# Patient Record
Sex: Female | Born: 1985 | Race: White | Hispanic: No | Marital: Married | Smoking: Former smoker
Health system: Southern US, Community
[De-identification: ages and names within clinical notes are randomized; demographics above are authoritative.]

## PROBLEM LIST (undated history)

## (undated) ENCOUNTER — Ambulatory Visit: Payer: MEDICAID | Source: Home / Self Care

## (undated) DIAGNOSIS — G8929 Other chronic pain: Secondary | ICD-10-CM

## (undated) DIAGNOSIS — F1120 Opioid dependence, uncomplicated: Secondary | ICD-10-CM

## (undated) DIAGNOSIS — K279 Peptic ulcer, site unspecified, unspecified as acute or chronic, without hemorrhage or perforation: Secondary | ICD-10-CM

## (undated) DIAGNOSIS — K219 Gastro-esophageal reflux disease without esophagitis: Secondary | ICD-10-CM

## (undated) DIAGNOSIS — E119 Type 2 diabetes mellitus without complications: Secondary | ICD-10-CM

## (undated) DIAGNOSIS — G629 Polyneuropathy, unspecified: Secondary | ICD-10-CM

## (undated) DIAGNOSIS — J449 Chronic obstructive pulmonary disease, unspecified: Secondary | ICD-10-CM

## (undated) DIAGNOSIS — F419 Anxiety disorder, unspecified: Secondary | ICD-10-CM

## (undated) DIAGNOSIS — R161 Splenomegaly, not elsewhere classified: Secondary | ICD-10-CM

## (undated) DIAGNOSIS — R569 Unspecified convulsions: Secondary | ICD-10-CM

## (undated) DIAGNOSIS — G4700 Insomnia, unspecified: Secondary | ICD-10-CM

## (undated) DIAGNOSIS — R109 Unspecified abdominal pain: Secondary | ICD-10-CM

## (undated) DIAGNOSIS — F32A Depression, unspecified: Secondary | ICD-10-CM

## (undated) DIAGNOSIS — K5790 Diverticulosis of intestine, part unspecified, without perforation or abscess without bleeding: Secondary | ICD-10-CM

## (undated) DIAGNOSIS — M329 Systemic lupus erythematosus, unspecified: Secondary | ICD-10-CM

## (undated) DIAGNOSIS — G43909 Migraine, unspecified, not intractable, without status migrainosus: Secondary | ICD-10-CM

## (undated) DIAGNOSIS — B1920 Unspecified viral hepatitis C without hepatic coma: Secondary | ICD-10-CM

## (undated) DIAGNOSIS — E785 Hyperlipidemia, unspecified: Secondary | ICD-10-CM

## (undated) DIAGNOSIS — J45909 Unspecified asthma, uncomplicated: Secondary | ICD-10-CM

## (undated) DIAGNOSIS — IMO0002 Reserved for concepts with insufficient information to code with codable children: Secondary | ICD-10-CM

## (undated) DIAGNOSIS — M549 Dorsalgia, unspecified: Secondary | ICD-10-CM

## (undated) DIAGNOSIS — K7460 Unspecified cirrhosis of liver: Secondary | ICD-10-CM

## (undated) DIAGNOSIS — K469 Unspecified abdominal hernia without obstruction or gangrene: Secondary | ICD-10-CM

## (undated) DIAGNOSIS — F329 Major depressive disorder, single episode, unspecified: Secondary | ICD-10-CM

## (undated) HISTORY — DX: Hyperlipidemia, unspecified: E78.5

## (undated) HISTORY — PX: CHOLECYSTECTOMY: SHX55

## (undated) HISTORY — DX: Splenomegaly, not elsewhere classified: R16.1

## (undated) HISTORY — DX: Type 2 diabetes mellitus without complications: E11.9

## (undated) HISTORY — DX: Unspecified asthma, uncomplicated: J45.909

## (undated) NOTE — *Deleted (*Deleted)
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 She was also found to be hypoglycemic. She did not respond to Narcan or glucose. She was intubated for airway protection and was sent to [facility] from [place]. Currently her respiratory status is stable, status post extubation. Patient transferred to [facility] service on 09/05/20.  Hospital course remarkable for encephalopathy with slow improvement and aphagia.Neurology consulted    Assessment & Plan:   Principal Problem:   Adjustment disorder with depressed mood Active Problems:   Pressure injury of skin   Respiratory failure (HCC)   Acute encephalopathy   Acute metabolic encephalopathy: Thought to be secondary to hypoglycemia, polypharmacy. 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 MRI of the brain did not show any acute intracranial abnormalities. None also in CT.She was taking Klonopin, Flexeril, Lyrica which have are on hold. Resumed home Seroquel, Effexor and methadone.  Mildly elevated ammonia level. LTM EEG showed severe diffuse encephalopathy likely related to sedation, no epileptiform discharges. Her mental status has improved.  Follows commands but does not talk and she is alert and awake and follows commands. We requested psychiatry consultation.  Psychiatry recommended to continue current medication with outpatient follow-up. Neurology consulted today.  Acute hypoxic respiratory failure: She was intubated for airway protection. Hospital course remarkable for aspiration pneumonitis. Currently on room air.  Aspiration pneumonitis: Sputum culture positive for Serratia  marcescens.Treated with cefipime  UTI: Urine culture positive for Enterococcus faecalis. Treated with  ampicillin.  Acute on chronic diastolic congestive heart failure: Appeared volume overloaded on presentation. Takes Lasix at home. Continue current Lasix regimen. Also taking lisinopril, Crestor and Aldactone at home.Continue IV lasix for now  Cirrhosis/NASH/hepatitis C: She says she has been treated for hepatitis C. 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 She has chronic anasarca/ascites. Follow-up ammonia level. Continue lactulose. liver enzymes are almost normal.  Anasarca improving with IV Lasix  Diabetes type 2: On Victoza, Metformin,insulin at home. Continue sliding scale insulin here. Her recent hemoglobin A1c is 7.1. She is on extremely high-dose of insulin at home. May be she can be discharged only on oral medications. I have consulted diabetic coordinator consultation  Pancytopenia: Likely associated with chronic liver disease/chronic medical problems. Also has thrombocytopenia in the setting of cirrhosis. Continue to monitor CBC.  Hypokalemia: Being monitored and supplemented   Morbid obesity: BMI of 46.9  Debility/deconditioning:  PT/OT evaluation done and recommended HH      Nutrition Problem: Inadequate oral intake Etiology: acute illness      DVT prophylaxis:Lovenox Code Status: Full Family Communication: Grandfather  present at the bedside Status is: Inpatient  Remains inpatient appropriate because:Inpatient level of care appropriate due to severity of illness   Dispo: The patient is from: Home              Anticipated d/c is to: Home              Anticipated d/c date is: tomorrow              Patient currently is not medically stable to d/c. Needs further improvement in the mental status before discharge.    Consultants: PCCM  Procedures:Intubation  Antimicrobials:  Anti-infectives (From admission, onward)   Start     Dose/Rate Route  Frequency Ordered Stop   09/03/20 1400  ceFEPIme (MAXIPIME) 2 g in sodium chloride 0.9 % 100 mL IVPB        2 g 200 mL/hr over 30 Minutes Intravenous Every 8 hours 09/03/20 1233 09/08/20 1400   09/03/20 1330  ampicillin (OMNIPEN) 2 g in sodium chloride 0.9 % 100 mL IVPB        2 g 300 mL/hr over 20 Minutes Intravenous Every 4 hours 09/03/20 1233 09/06/20 1240   09/03/20 0930  piperacillin-tazobactam (ZOSYN) IVPB 3.375 g  Status:  Discontinued        3.375 g 12.5 mL/hr over 240 Minutes Intravenous Every 8 hours 09/03/20 0844 09/03/20 1233   08/31/20 2015  ceFEPIme (MAXIPIME) 2 g in sodium chloride 0.9 % 100 mL IVPB  Status:  Discontinued        2 g 200 mL/hr over 30 Minutes Intravenous Every 8 hours 08/31/20 1924 09/02/20 0813   08/31/20 1930  linezolid (ZYVOX) IVPB 600 mg  Status:  Discontinued        600 mg 300 mL/hr over 60 Minutes Intravenous Every 12 hours 08/31/20 1922 09/01/20 0810   08/31/20 1830  ceFEPIme (MAXIPIME) 2 g in sodium chloride 0.9 % 100 mL IVPB  Status:  Discontinued        2 g 200 mL/hr over 30 Minutes Intravenous  Once 08/31/20 1817 08/31/20 1924      Subjective:  Patient seen and examined at the bedside this morning.  Hemodynamically stable.  She looks better than yesterday.  Remains alert, awake and obeys commands.  She said a word "November "to me but the speech is not a spontaneous  Objective: Vitals:   09/08/20 1408 09/08/20 2045 09/09/20 0326 09/09/20 0524  BP: 138/79 132/81  129/87  Pulse: (!) 107 92  93  Resp: 18 18  20   Temp: 98.2 F (36.8 C) 98.9 F (37.2 C)  98.8 F (37.1 C)  TempSrc:  Oral  Oral  SpO2: 92% 99%  96%  Weight:   133.9 kg   Height:        Intake/Output Summary (Last 24 hours) at 09/09/2020 0747 Last data filed at 09/08/2020 2048 Gross per 24 hour  Intake -  Output 1600 ml  Net -1600 ml   Filed Weights   09/03/20 0500 09/04/20 0358 09/09/20 0326  Weight: (!) 146.5 kg (!) 144.2 kg 133.9 kg    Examination:  General  exam: Appears calm and comfortable ,Not in distress, morbidly obese HEENT:PERRL,Oral mucosa moist, Ear/Nose normal on gross exam Respiratory system: Bilateral equal air entry, normal vesicular breath sounds, no wheezes or crackles  Cardiovascular system: S1 & S2 heard, RRR. No JVD, murmurs, rubs, gallops or clicks. Gastrointestinal system: Abdomen is nondistended, soft and nontender. No organomegaly or masses felt. Normal bowel sounds heard. Central nervous system: Alert and awake. No focal neurological deficits. Extremities: Improving anasarca, no clubbing ,no cyanosis Skin: No rashes, lesions or ulcers,no icterus ,no pallor   Data Reviewed: I have personally reviewed following labs and imaging studies  CBC: Recent Labs  Lab 09/04/20 0110 09/05/20 0027 09/06/20 0337 09/07/20 0450 09/09/20 0226  WBC 3.4* 3.8* 2.9* 2.7* 3.0*  HGB 8.8* 9.1* 9.4* 9.2* 9.6*  HCT 27.1* 28.5* 29.4* 29.0* 30.8*  MCV 91.6 92.2 92.5 93.2 94.5  PLT 70* 71* 73* 74* 75*   Basic Metabolic Panel: Recent Labs  Lab 09/04/20 0110 09/04/20  0110 09/05/20 0027 09/06/20 0337 09/07/20 0450 09/08/20 0910 09/09/20 0226  NA 139   < > 141 142 142 143 142  K 3.7   < > 3.3* 3.3* 3.4* 3.5 4.3  CL 104   < > 105 107 106 108 106  CO2 26   < > 27 26 26 26 27   GLUCOSE 153*   < > 121* 124* 128* 124* 125*  BUN 12   < > 11 9 11 12 11   CREATININE 0.56   < > 0.57 0.56 0.58 0.53 0.63  CALCIUM 8.0*   < > 8.0* 8.4* 8.6* 8.7* 8.7*  MG 2.0  --   --  1.8  --   --   --    < > = values in this interval not displayed.   GFR: Estimated Creatinine Clearance: 143.2 mL/min (by C-G formula based on SCr of 0.63 mg/dL). Liver Function Tests: Recent Labs  Lab 09/04/20 0110  AST 46*  ALT 31  ALKPHOS 82  BILITOT 0.6  PROT 6.1*  ALBUMIN 2.4*   No results for input(s): LIPASE, AMYLASE in the last 168 hours. Recent Labs  Lab 09/03/20 0926 09/04/20 0110 09/05/20 1400 09/06/20 0337 09/08/20 0910  AMMONIA 48* 52* 54* 44* 43*    Coagulation Profile: No results for input(s): INR, PROTIME in the last 168 hours. Cardiac Enzymes: No results for input(s): CKTOTAL, CKMB, CKMBINDEX, TROPONINI in the last 168 hours. BNP (last 3 results) No results for input(s): PROBNP in the last 8760 hours. HbA1C: No results for input(s): HGBA1C in the last 72 hours. CBG: Recent Labs  Lab 09/08/20 1145 09/08/20 1550 09/08/20 1912 09/08/20 2304 09/09/20 0308  GLUCAP 145* 150* 153* 120* 121*   Lipid Profile: No results for input(s): CHOL, HDL, LDLCALC, TRIG, CHOLHDL, LDLDIRECT in the last 72 hours. Thyroid Function Tests: Recent Labs    09/08/20 1727  TSH 0.635   Anemia Panel: Recent Labs    09/08/20 1727  VITAMINB12 326   Sepsis Labs: No results for input(s): PROCALCITON, LATICACIDVEN in the last 168 hours.  Recent Results (from the past 240 hour(s))  Urine culture     Status: Abnormal   Collection Time: 08/31/20  9:21 AM   Specimen: Urine, Clean Catch  Result Value Ref Range Status   Specimen Description   Final    URINE, CLEAN CATCH Performed at [facility], [address], [place], [place] [address]    Special Requests   Final    NONE Performed at [facility], [address], [place], [place] [address]    Culture >=100,000 COLONIES/mL ENTEROCOCCUS FAECALIS (A)  Final   Report Status 09/03/2020 FINAL  Final   Organism ID, Bacteria ENTEROCOCCUS FAECALIS (A)  Final      Susceptibility   Enterococcus faecalis - MIC*    AMPICILLIN <=2 SENSITIVE Sensitive     NITROFURANTOIN <=16 SENSITIVE Sensitive     VANCOMYCIN 1 SENSITIVE Sensitive     * >=100,000 COLONIES/mL ENTEROCOCCUS FAECALIS  Respiratory Panel by RT PCR (Flu A&B, Covid) - Nasopharyngeal Swab     Status: None   Collection Time: 08/31/20  9:34 AM   Specimen: Nasopharyngeal Swab  Result Value Ref Range Status   SARS Coronavirus 2 by RT PCR NEGATIVE NEGATIVE Final    Comment: (NOTE) SARS-CoV-2 target nucleic acids are NOT DETECTED.  The SARS-CoV-2  RNA is generally detectable in upper respiratoy specimens during the acute phase of infection. The lowest concentration of SARS-CoV-2 viral copies this assay can detect  is 131 copies/mL. A negative result does not preclude SARS-Cov-2 infection and should not be used as the sole basis for treatment or other patient management decisions. A negative result may occur with  improper specimen collection/handling, submission of specimen other than nasopharyngeal swab, presence of viral mutation(s) within the areas targeted by this assay, and inadequate number of viral copies (<131 copies/mL). A negative result must be combined with clinical observations, patient history, and epidemiological information. The expected result is Negative.  Fact Sheet for Patients:  PinkCheek.be  Fact Sheet for Healthcare Providers:  GravelBags.it  This test is no t yet approved or cleared by the [place] FDA and  has been authorized for detection and/or diagnosis of SARS-CoV-2 by FDA under an Emergency Use Authorization (EUA). This EUA will remain  in effect (meaning this test can be used) for the duration of the COVID-19 declaration under Section 564(b)(1) of the Act, 21 U.S.C. section 360bbb-3(b)(1), unless the authorization is terminated or revoked sooner.     Influenza A by PCR NEGATIVE NEGATIVE Final   Influenza B by PCR NEGATIVE NEGATIVE Final    Comment: (NOTE) The Xpert Xpress SARS-CoV-2/FLU/RSV assay is intended as an aid in  the diagnosis of influenza from Nasopharyngeal swab specimens and  should not be used as a sole basis for treatment. Nasal washings and  aspirates are unacceptable for Xpert Xpress SARS-CoV-2/FLU/RSV  testing.  Fact Sheet for Patients: PinkCheek.be  Fact Sheet for Healthcare Providers: GravelBags.it  This test is not yet approved or cleared by the  [place] FDA and  has been authorized for detection and/or diagnosis of SARS-CoV-2 by  FDA under an Emergency Use Authorization (EUA). This EUA will remain  in effect (meaning this test can be used) for the duration of the  Covid-19 declaration under Section 564(b)(1) of the Act, 21  U.S.C. section 360bbb-3(b)(1), unless the authorization is  terminated or revoked. Performed at [facility], [address], [place], [place] [address]   Culture, blood (routine x 2)     Status: None   Collection Time: 08/31/20 10:18 AM   Specimen: BLOOD  Result Value Ref Range Status   Specimen Description   Final    BLOOD BLOOD LEFT ARM Performed at [facility], [place] [address], [place], [place] [address]    Special Requests   Final    BOTTLES DRAWN AEROBIC AND ANAEROBIC Blood Culture adequate volume Performed at [facility], [place] [address], [place], [place] [address]    Culture   Final    NO GROWTH 5 DAYS Performed at [facility], [address], [place], [place] [address]    Report Status 09/05/2020 FINAL  Final  MRSA PCR Screening     Status: None   Collection Time: 08/31/20  6:27 PM   Specimen: Nasopharyngeal  Result Value Ref Range Status   MRSA by PCR NEGATIVE NEGATIVE Final    Comment:        The GeneXpert MRSA Assay (FDA approved for NASAL specimens only), is one component of a comprehensive MRSA colonization surveillance program. It is not intended to diagnose MRSA infection nor to guide or monitor treatment for MRSA infections. Performed at [facility], [place] [address] [place], [place] [address]   Culture, respiratory     Status: None   Collection Time: 09/01/20 12:11 AM   Specimen: Tracheal Aspirate  Result Value Ref Range Status   Specimen Description TRACHEAL ASPIRATE  Final   Special  Requests NONE  Final   Gram Stain   Final    FEW WBC PRESENT, PREDOMINANTLY PMN FEW GRAM NEGATIVE RODS RARE GRAM POSITIVE  COCCI Performed at [facility], [place] [address], [place], [place] [address]    Culture MODERATE SERRATIA MARCESCENS  Final   Report Status 09/03/2020 FINAL  Final   Organism ID, Bacteria SERRATIA MARCESCENS  Final      Susceptibility   Serratia marcescens - MIC*    CEFAZOLIN >=64 RESISTANT Resistant     CEFEPIME <=0.12 SENSITIVE Sensitive     CEFTAZIDIME <=1 SENSITIVE Sensitive     CEFTRIAXONE <=0.25 SENSITIVE Sensitive     CIPROFLOXACIN <=0.25 SENSITIVE Sensitive     GENTAMICIN <=1 SENSITIVE Sensitive     TRIMETH/SULFA <=20 SENSITIVE Sensitive     * MODERATE SERRATIA MARCESCENS  Culture, blood (single)     Status: None   Collection Time: 09/01/20  2:55 AM   Specimen: BLOOD  Result Value Ref Range Status   Specimen Description BLOOD SITE NOT SPECIFIED  Final   Special Requests   Final    BOTTLES DRAWN AEROBIC AND ANAEROBIC Blood Culture adequate volume   Culture   Final    NO GROWTH 5 DAYS Performed at [facility], [address], [place], [place] [address]    Report Status 09/06/2020 FINAL  Final         Radiology Studies: No results found.      Scheduled Meds: . acetaminophen  650 mg Oral Once  . chlorhexidine  15 mL Mouth Rinse BID  . Chlorhexidine Gluconate Cloth  6 each Topical Daily  . docusate sodium  100 mg Oral Daily  . enoxaparin (LOVENOX) injection  70 mg Subcutaneous Q24H  . feeding supplement  237 mL Oral BID BM  . furosemide  40 mg Oral BID  . insulin aspart  0-20 Units Subcutaneous Q4H  . lactulose  10 g Oral TID  . mouth rinse  15 mL Mouth Rinse q12n4p  . methadone  30 mg Oral Daily  . pantoprazole sodium  40 mg Oral Q24H  . polyethylene glycol  17 g Oral Daily  . QUEtiapine  25 mg Oral QHS  . venlafaxine XR  225 mg Oral Q1200   Continuous Infusions: . sodium chloride 10 mL/hr at 09/04/20 2125     LOS: 9 days    Time spent: 25 mins.More than 50% of that time was spent in counseling and/or coordination of care.       Shelly Coss, MD [facility] P11/06/2020, 7:47 AM

---
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
## 2003-10-07 ENCOUNTER — Emergency Department (HOSPITAL_COMMUNITY): Admission: EM | Admit: 2003-10-07 | Discharge: 2003-10-07 | Payer: Self-pay | Admitting: Emergency Medicine

## 2004-04-11 ENCOUNTER — Emergency Department (HOSPITAL_COMMUNITY): Admission: EM | Admit: 2004-04-11 | Discharge: 2004-04-11 | Payer: Self-pay | Admitting: Emergency Medicine

## 2004-05-15 ENCOUNTER — Ambulatory Visit (HOSPITAL_COMMUNITY): Admission: RE | Admit: 2004-05-15 | Discharge: 2004-05-15 | Payer: Self-pay | Admitting: Family Medicine

## 2004-10-25 ENCOUNTER — Emergency Department (HOSPITAL_COMMUNITY): Admission: EM | Admit: 2004-10-25 | Discharge: 2004-10-25 | Payer: Self-pay | Admitting: Emergency Medicine

## 2004-12-09 ENCOUNTER — Ambulatory Visit (HOSPITAL_COMMUNITY): Admission: RE | Admit: 2004-12-09 | Discharge: 2004-12-09 | Payer: Self-pay | Admitting: Family Medicine

## 2005-01-08 ENCOUNTER — Ambulatory Visit: Payer: Self-pay | Admitting: Orthopedic Surgery

## 2005-03-07 ENCOUNTER — Emergency Department (HOSPITAL_COMMUNITY): Admission: EM | Admit: 2005-03-07 | Discharge: 2005-03-07 | Payer: Self-pay | Admitting: Emergency Medicine

## 2005-04-08 ENCOUNTER — Ambulatory Visit (HOSPITAL_COMMUNITY): Admission: RE | Admit: 2005-04-08 | Discharge: 2005-04-08 | Payer: Self-pay | Admitting: Preventative Medicine

## 2005-05-12 ENCOUNTER — Emergency Department (HOSPITAL_COMMUNITY): Admission: EM | Admit: 2005-05-12 | Discharge: 2005-05-12 | Payer: Self-pay | Admitting: Emergency Medicine

## 2005-09-04 ENCOUNTER — Emergency Department (HOSPITAL_COMMUNITY): Admission: EM | Admit: 2005-09-04 | Discharge: 2005-09-04 | Payer: Self-pay | Admitting: Emergency Medicine

## 2006-05-21 ENCOUNTER — Emergency Department (HOSPITAL_COMMUNITY): Admission: EM | Admit: 2006-05-21 | Discharge: 2006-05-21 | Payer: Self-pay | Admitting: Emergency Medicine

## 2006-05-29 ENCOUNTER — Emergency Department (HOSPITAL_COMMUNITY): Admission: EM | Admit: 2006-05-29 | Discharge: 2006-05-29 | Payer: Self-pay | Admitting: Emergency Medicine

## 2006-06-15 ENCOUNTER — Emergency Department (HOSPITAL_COMMUNITY): Admission: EM | Admit: 2006-06-15 | Discharge: 2006-06-15 | Payer: Self-pay | Admitting: Emergency Medicine

## 2006-11-09 ENCOUNTER — Emergency Department (HOSPITAL_COMMUNITY): Admission: EM | Admit: 2006-11-09 | Discharge: 2006-11-09 | Payer: Self-pay | Admitting: Emergency Medicine

## 2006-11-30 ENCOUNTER — Emergency Department (HOSPITAL_COMMUNITY): Admission: EM | Admit: 2006-11-30 | Discharge: 2006-11-30 | Payer: Self-pay | Admitting: Emergency Medicine

## 2006-12-07 ENCOUNTER — Emergency Department (HOSPITAL_COMMUNITY): Admission: EM | Admit: 2006-12-07 | Discharge: 2006-12-07 | Payer: Self-pay | Admitting: Emergency Medicine

## 2007-03-13 ENCOUNTER — Emergency Department (HOSPITAL_COMMUNITY): Admission: EM | Admit: 2007-03-13 | Discharge: 2007-03-13 | Payer: Self-pay | Admitting: Emergency Medicine

## 2007-03-31 ENCOUNTER — Ambulatory Visit: Payer: Self-pay | Admitting: Orthopedic Surgery

## 2007-04-11 ENCOUNTER — Emergency Department (HOSPITAL_COMMUNITY): Admission: EM | Admit: 2007-04-11 | Discharge: 2007-04-11 | Payer: Self-pay | Admitting: Emergency Medicine

## 2007-04-27 ENCOUNTER — Ambulatory Visit (HOSPITAL_COMMUNITY): Admission: RE | Admit: 2007-04-27 | Discharge: 2007-04-27 | Payer: Self-pay | Admitting: Internal Medicine

## 2007-05-27 ENCOUNTER — Inpatient Hospital Stay (HOSPITAL_COMMUNITY): Admission: AD | Admit: 2007-05-27 | Discharge: 2007-06-02 | Payer: Self-pay

## 2007-05-30 ENCOUNTER — Encounter: Payer: Self-pay | Admitting: Internal Medicine

## 2007-05-30 ENCOUNTER — Ambulatory Visit: Payer: Self-pay | Admitting: Internal Medicine

## 2007-05-31 ENCOUNTER — Ambulatory Visit: Payer: Self-pay | Admitting: Gastroenterology

## 2007-06-10 ENCOUNTER — Inpatient Hospital Stay (HOSPITAL_COMMUNITY): Admission: EM | Admit: 2007-06-10 | Discharge: 2007-06-15 | Payer: Self-pay | Admitting: Emergency Medicine

## 2007-06-11 ENCOUNTER — Ambulatory Visit: Payer: Self-pay | Admitting: Gastroenterology

## 2007-06-13 ENCOUNTER — Ambulatory Visit: Payer: Self-pay | Admitting: Gastroenterology

## 2007-06-14 ENCOUNTER — Ambulatory Visit: Payer: Self-pay | Admitting: Gastroenterology

## 2007-06-14 ENCOUNTER — Encounter: Payer: Self-pay | Admitting: Gastroenterology

## 2007-06-15 ENCOUNTER — Ambulatory Visit: Payer: Self-pay | Admitting: Gastroenterology

## 2007-07-02 ENCOUNTER — Emergency Department (HOSPITAL_COMMUNITY): Admission: EM | Admit: 2007-07-02 | Discharge: 2007-07-02 | Payer: Self-pay | Admitting: Emergency Medicine

## 2007-07-14 ENCOUNTER — Emergency Department (HOSPITAL_COMMUNITY): Admission: EM | Admit: 2007-07-14 | Discharge: 2007-07-14 | Payer: Self-pay | Admitting: Emergency Medicine

## 2007-08-19 ENCOUNTER — Emergency Department (HOSPITAL_COMMUNITY): Admission: EM | Admit: 2007-08-19 | Discharge: 2007-08-19 | Payer: Self-pay | Admitting: Emergency Medicine

## 2007-08-23 ENCOUNTER — Emergency Department (HOSPITAL_COMMUNITY): Admission: EM | Admit: 2007-08-23 | Discharge: 2007-08-24 | Payer: Self-pay | Admitting: Emergency Medicine

## 2007-09-03 ENCOUNTER — Inpatient Hospital Stay (HOSPITAL_COMMUNITY): Admission: EM | Admit: 2007-09-03 | Discharge: 2007-09-06 | Payer: Self-pay | Admitting: Emergency Medicine

## 2007-09-04 ENCOUNTER — Ambulatory Visit: Payer: Self-pay | Admitting: Internal Medicine

## 2007-09-05 ENCOUNTER — Ambulatory Visit: Payer: Self-pay | Admitting: Internal Medicine

## 2007-11-21 ENCOUNTER — Ambulatory Visit (HOSPITAL_COMMUNITY): Admission: RE | Admit: 2007-11-21 | Discharge: 2007-11-21 | Payer: Self-pay | Admitting: Internal Medicine

## 2008-04-04 ENCOUNTER — Emergency Department (HOSPITAL_COMMUNITY): Admission: EM | Admit: 2008-04-04 | Discharge: 2008-04-04 | Payer: Self-pay | Admitting: Emergency Medicine

## 2008-06-27 ENCOUNTER — Inpatient Hospital Stay (HOSPITAL_COMMUNITY): Admission: EM | Admit: 2008-06-27 | Discharge: 2008-06-30 | Payer: Self-pay | Admitting: Emergency Medicine

## 2008-09-18 ENCOUNTER — Emergency Department (HOSPITAL_COMMUNITY): Admission: EM | Admit: 2008-09-18 | Discharge: 2008-09-18 | Payer: Self-pay | Admitting: Emergency Medicine

## 2008-09-21 ENCOUNTER — Emergency Department (HOSPITAL_COMMUNITY): Admission: EM | Admit: 2008-09-21 | Discharge: 2008-09-21 | Payer: Self-pay | Admitting: Emergency Medicine

## 2008-11-18 ENCOUNTER — Emergency Department (HOSPITAL_COMMUNITY): Admission: EM | Admit: 2008-11-18 | Discharge: 2008-11-18 | Payer: Self-pay | Admitting: Emergency Medicine

## 2009-05-08 ENCOUNTER — Inpatient Hospital Stay (HOSPITAL_COMMUNITY): Admission: EM | Admit: 2009-05-08 | Discharge: 2009-05-09 | Payer: Self-pay | Admitting: Emergency Medicine

## 2009-05-11 ENCOUNTER — Emergency Department: Payer: Self-pay | Admitting: Internal Medicine

## 2009-05-12 ENCOUNTER — Emergency Department (HOSPITAL_COMMUNITY): Admission: EM | Admit: 2009-05-12 | Discharge: 2009-05-12 | Payer: Self-pay | Admitting: Emergency Medicine

## 2009-08-30 ENCOUNTER — Emergency Department (HOSPITAL_COMMUNITY): Admission: EM | Admit: 2009-08-30 | Discharge: 2009-08-30 | Payer: Self-pay | Admitting: Emergency Medicine

## 2009-09-08 ENCOUNTER — Emergency Department (HOSPITAL_COMMUNITY): Admission: EM | Admit: 2009-09-08 | Discharge: 2009-09-08 | Payer: Self-pay | Admitting: Emergency Medicine

## 2009-09-15 ENCOUNTER — Emergency Department (HOSPITAL_COMMUNITY): Admission: EM | Admit: 2009-09-15 | Discharge: 2009-09-15 | Payer: Self-pay | Admitting: Emergency Medicine

## 2009-10-03 ENCOUNTER — Emergency Department (HOSPITAL_COMMUNITY): Admission: EM | Admit: 2009-10-03 | Discharge: 2009-10-03 | Payer: Self-pay | Admitting: Emergency Medicine

## 2009-12-26 ENCOUNTER — Ambulatory Visit: Payer: Self-pay | Admitting: Gastroenterology

## 2010-03-07 ENCOUNTER — Emergency Department (HOSPITAL_COMMUNITY): Admission: EM | Admit: 2010-03-07 | Discharge: 2010-03-07 | Payer: Self-pay | Admitting: Emergency Medicine

## 2010-05-23 ENCOUNTER — Emergency Department (HOSPITAL_COMMUNITY): Admission: EM | Admit: 2010-05-23 | Discharge: 2010-05-23 | Payer: Self-pay | Admitting: Emergency Medicine

## 2010-06-27 ENCOUNTER — Emergency Department (HOSPITAL_COMMUNITY): Admission: EM | Admit: 2010-06-27 | Discharge: 2010-06-27 | Payer: Self-pay | Admitting: Emergency Medicine

## 2010-06-30 ENCOUNTER — Emergency Department (HOSPITAL_COMMUNITY): Admission: EM | Admit: 2010-06-30 | Discharge: 2010-06-30 | Payer: Self-pay | Admitting: Emergency Medicine

## 2010-07-06 ENCOUNTER — Emergency Department (HOSPITAL_COMMUNITY): Admission: EM | Admit: 2010-07-06 | Discharge: 2010-07-06 | Payer: Self-pay | Admitting: Emergency Medicine

## 2010-07-13 ENCOUNTER — Emergency Department (HOSPITAL_COMMUNITY): Admission: EM | Admit: 2010-07-13 | Discharge: 2010-07-13 | Payer: Self-pay | Admitting: Emergency Medicine

## 2010-08-01 ENCOUNTER — Emergency Department (HOSPITAL_COMMUNITY): Admission: EM | Admit: 2010-08-01 | Discharge: 2010-08-01 | Payer: Self-pay | Admitting: Emergency Medicine

## 2010-08-02 ENCOUNTER — Emergency Department (HOSPITAL_COMMUNITY): Admission: EM | Admit: 2010-08-02 | Discharge: 2010-08-02 | Payer: Self-pay | Admitting: Emergency Medicine

## 2010-08-16 ENCOUNTER — Emergency Department (HOSPITAL_COMMUNITY): Admission: EM | Admit: 2010-08-16 | Discharge: 2010-08-17 | Payer: Self-pay | Admitting: Emergency Medicine

## 2010-08-21 ENCOUNTER — Emergency Department (HOSPITAL_COMMUNITY): Admission: EM | Admit: 2010-08-21 | Discharge: 2010-08-21 | Payer: Self-pay | Admitting: Emergency Medicine

## 2010-08-21 ENCOUNTER — Inpatient Hospital Stay (HOSPITAL_COMMUNITY): Admission: EM | Admit: 2010-08-21 | Discharge: 2010-08-25 | Payer: Self-pay | Admitting: Emergency Medicine

## 2010-08-22 ENCOUNTER — Encounter (INDEPENDENT_AMBULATORY_CARE_PROVIDER_SITE_OTHER): Payer: Self-pay | Admitting: Internal Medicine

## 2010-10-19 ENCOUNTER — Emergency Department (HOSPITAL_COMMUNITY)
Admission: EM | Admit: 2010-10-19 | Discharge: 2010-10-19 | Payer: Self-pay | Source: Home / Self Care | Admitting: Emergency Medicine

## 2010-11-24 ENCOUNTER — Encounter: Payer: Self-pay | Admitting: Neurosurgery

## 2010-12-21 ENCOUNTER — Emergency Department (HOSPITAL_COMMUNITY): Payer: Self-pay

## 2010-12-21 ENCOUNTER — Emergency Department (HOSPITAL_COMMUNITY)
Admission: EM | Admit: 2010-12-21 | Discharge: 2010-12-21 | Disposition: A | Discharge status: Home / Self Care | Payer: Self-pay | Attending: Emergency Medicine | Admitting: Emergency Medicine

## 2010-12-21 DIAGNOSIS — Y92009 Unspecified place in unspecified non-institutional (private) residence as the place of occurrence of the external cause: Secondary | ICD-10-CM | POA: Insufficient documentation

## 2010-12-21 DIAGNOSIS — S301XXA Contusion of abdominal wall, initial encounter: Secondary | ICD-10-CM | POA: Insufficient documentation

## 2010-12-21 DIAGNOSIS — L02519 Cutaneous abscess of unspecified hand: Secondary | ICD-10-CM | POA: Insufficient documentation

## 2010-12-21 DIAGNOSIS — S6990XA Unspecified injury of unspecified wrist, hand and finger(s), initial encounter: Secondary | ICD-10-CM | POA: Insufficient documentation

## 2010-12-21 DIAGNOSIS — L03119 Cellulitis of unspecified part of limb: Secondary | ICD-10-CM | POA: Insufficient documentation

## 2010-12-21 LAB — URINALYSIS, ROUTINE W REFLEX MICROSCOPIC
Ketones, ur: NEGATIVE mg/dL
Protein, ur: NEGATIVE mg/dL
Urine Glucose, Fasting: NEGATIVE mg/dL
pH: 7 (ref 5.0–8.0)

## 2010-12-21 LAB — CBC
HCT: 37.3 % (ref 36.0–46.0)
MCH: 27.4 pg (ref 26.0–34.0)
MCHC: 33.8 g/dL (ref 30.0–36.0)
MCV: 81.1 fL (ref 78.0–100.0)
Platelets: 232 10*3/uL (ref 150–400)
RDW: 12.8 % (ref 11.5–15.5)
WBC: 12.5 10*3/uL — ABNORMAL HIGH (ref 4.0–10.5)

## 2010-12-21 LAB — DIFFERENTIAL
Eosinophils Absolute: 0.1 10*3/uL (ref 0.0–0.7)
Eosinophils Relative: 1 % (ref 0–5)
Lymphocytes Relative: 15 % (ref 12–46)
Lymphs Abs: 1.9 10*3/uL (ref 0.7–4.0)
Monocytes Absolute: 1.1 10*3/uL — ABNORMAL HIGH (ref 0.1–1.0)
Monocytes Relative: 9 % (ref 3–12)

## 2010-12-21 LAB — BASIC METABOLIC PANEL
BUN: 5 mg/dL — ABNORMAL LOW (ref 6–23)
Creatinine, Ser: 0.64 mg/dL (ref 0.4–1.2)
GFR calc non Af Amer: >60 mL/min (ref >60)
Glucose, Bld: 108 mg/dL — ABNORMAL HIGH (ref 70–99)
Potassium: 4.3 mEq/L (ref 3.5–5.1)

## 2010-12-22 ENCOUNTER — Emergency Department (HOSPITAL_COMMUNITY): Payer: Self-pay

## 2010-12-22 ENCOUNTER — Emergency Department (HOSPITAL_COMMUNITY)
Admission: EM | Admit: 2010-12-22 | Discharge: 2010-12-22 | Disposition: A | Discharge status: Home / Self Care | Payer: Self-pay | Attending: Emergency Medicine | Admitting: Emergency Medicine

## 2010-12-22 DIAGNOSIS — R109 Unspecified abdominal pain: Secondary | ICD-10-CM | POA: Insufficient documentation

## 2010-12-22 DIAGNOSIS — L02519 Cutaneous abscess of unspecified hand: Secondary | ICD-10-CM | POA: Insufficient documentation

## 2010-12-22 LAB — BASIC METABOLIC PANEL
BUN: 6 mg/dL (ref 6–23)
Calcium: 9.2 mg/dL (ref 8.4–10.5)
Chloride: 104 mEq/L (ref 96–112)
Creatinine, Ser: 0.64 mg/dL (ref 0.4–1.2)
GFR calc Af Amer: >60 mL/min (ref >60)

## 2010-12-22 LAB — URINALYSIS, ROUTINE W REFLEX MICROSCOPIC
Hgb urine dipstick: NEGATIVE
Ketones, ur: NEGATIVE mg/dL
Protein, ur: NEGATIVE mg/dL
Urine Glucose, Fasting: NEGATIVE mg/dL
Urobilinogen, UA: 1 mg/dL (ref 0.0–1.0)

## 2010-12-22 LAB — DIFFERENTIAL
Basophils Absolute: 0 10*3/uL (ref 0.0–0.1)
Eosinophils Relative: 1 % (ref 0–5)
Lymphs Abs: 3.1 10*3/uL (ref 0.7–4.0)
Monocytes Absolute: 0.8 10*3/uL (ref 0.1–1.0)
Monocytes Relative: 9 % (ref 3–12)
Neutro Abs: 5 10*3/uL (ref 1.7–7.7)

## 2010-12-22 LAB — CBC
HCT: 35.2 % — ABNORMAL LOW (ref 36.0–46.0)
Hemoglobin: 11.8 g/dL — ABNORMAL LOW (ref 12.0–15.0)
MCH: 27.1 pg (ref 26.0–34.0)
MCHC: 33.5 g/dL (ref 30.0–36.0)
RDW: 12.7 % (ref 11.5–15.5)

## 2010-12-22 LAB — PREGNANCY, URINE: Preg Test, Ur: NEGATIVE

## 2010-12-22 MED ORDER — IOHEXOL 300 MG/ML  SOLN
100.0000 mL | Freq: Once | INTRAMUSCULAR | Status: AC | PRN
  Administered 2010-12-22: 100 mL via INTRAVENOUS

## 2011-01-12 LAB — COMPREHENSIVE METABOLIC PANEL
Albumin: 3.7 g/dL (ref 3.5–5.2)
Alkaline Phosphatase: 56 U/L (ref 39–117)
BUN: 7 mg/dL (ref 6–23)
CO2: 24 mEq/L (ref 19–32)
Chloride: 105 mEq/L (ref 96–112)
Potassium: 4 mEq/L (ref 3.5–5.1)
Total Bilirubin: 0.4 mg/dL (ref 0.3–1.2)

## 2011-01-12 LAB — URINE MICROSCOPIC-ADD ON

## 2011-01-12 LAB — URINE CULTURE: Culture  Setup Time: 201112182049

## 2011-01-12 LAB — URINALYSIS, ROUTINE W REFLEX MICROSCOPIC
Bilirubin Urine: NEGATIVE
Glucose, UA: NEGATIVE mg/dL
Ketones, ur: NEGATIVE mg/dL
pH: 6.5 (ref 5.0–8.0)

## 2011-01-12 LAB — POCT PREGNANCY, URINE: Preg Test, Ur: NEGATIVE

## 2011-01-14 LAB — BASIC METABOLIC PANEL
BUN: 3 mg/dL — ABNORMAL LOW (ref 6–23)
BUN: 4 mg/dL — ABNORMAL LOW (ref 6–23)
BUN: 6 mg/dL (ref 6–23)
CO2: 24 mEq/L (ref 19–32)
CO2: 29 mEq/L (ref 19–32)
CO2: 26 mEq/L (ref 19–32)
Calcium: 8.6 mg/dL (ref 8.4–10.5)
Calcium: 8.8 mg/dL (ref 8.4–10.5)
Calcium: 9.2 mg/dL (ref 8.4–10.5)
Chloride: 103 mEq/L (ref 96–112)
Chloride: 107 mEq/L (ref 96–112)
Creatinine, Ser: 0.6 mg/dL (ref 0.4–1.2)
Creatinine, Ser: 0.68 mg/dL (ref 0.4–1.2)
Creatinine, Ser: 0.7 mg/dL (ref 0.4–1.2)
GFR calc Af Amer: >60 mL/min (ref >60)
GFR calc Af Amer: >60 mL/min (ref >60)
GFR calc non Af Amer: >60 mL/min (ref >60)
GFR calc non Af Amer: >60 mL/min (ref >60)
GFR calc non Af Amer: >60 mL/min (ref >60)
Glucose, Bld: 101 mg/dL — ABNORMAL HIGH (ref 70–99)
Glucose, Bld: 99 mg/dL (ref 70–99)
Potassium: 3.9 mEq/L (ref 3.5–5.1)
Potassium: 3.8 mEq/L (ref 3.5–5.1)
Sodium: 132 mEq/L — ABNORMAL LOW (ref 135–145)
Sodium: 139 mEq/L (ref 135–145)

## 2011-01-14 LAB — BLOOD GAS, ARTERIAL
Acid-base deficit: 0.1 mmol/L (ref 0.0–2.0)
Bicarbonate: 24.4 mEq/L — ABNORMAL HIGH (ref 20.0–24.0)
O2 Saturation: 93.4 %
pH, Arterial: 7.357 (ref 7.350–7.400)
pO2, Arterial: 65.5 mmHg — ABNORMAL LOW (ref 80.0–100.0)
pO2, Arterial: 94.1 mmHg (ref 80.0–100.0)

## 2011-01-14 LAB — RAPID URINE DRUG SCREEN, HOSP PERFORMED
Amphetamines: NOT DETECTED
Cocaine: NOT DETECTED
Opiates: POSITIVE — AB
Opiates: POSITIVE — AB
Tetrahydrocannabinol: NOT DETECTED
Tetrahydrocannabinol: NOT DETECTED

## 2011-01-14 LAB — CBC
HCT: 34.3 % — ABNORMAL LOW (ref 36.0–46.0)
HCT: 36.8 % (ref 36.0–46.0)
HCT: 40.8 % (ref 36.0–46.0)
Hemoglobin: 12.2 g/dL (ref 12.0–15.0)
Hemoglobin: 12.6 g/dL (ref 12.0–15.0)
Hemoglobin: 14.1 g/dL (ref 12.0–15.0)
MCH: 29.8 pg (ref 26.0–34.0)
MCH: 30 pg (ref 26.0–34.0)
MCHC: 34.1 g/dL (ref 30.0–36.0)
MCHC: 34.4 g/dL (ref 30.0–36.0)
MCHC: 34.5 g/dL (ref 30.0–36.0)
MCV: 86.9 fL (ref 78.0–100.0)
MCV: 87 fL (ref 78.0–100.0)
MCV: 87.6 fL (ref 78.0–100.0)
Platelets: 198 10*3/uL (ref 150–400)
Platelets: 239 10*3/uL (ref 150–400)
Platelets: 242 10*3/uL (ref 150–400)
Platelets: 285 10*3/uL (ref 150–400)
RBC: 3.94 MIL/uL (ref 3.87–5.11)
RBC: 4.26 MIL/uL (ref 3.87–5.11)
RBC: 4.95 MIL/uL (ref 3.87–5.11)
RDW: 12.3 % (ref 11.5–15.5)
RDW: 12.4 % (ref 11.5–15.5)
RDW: 12.5 % (ref 11.5–15.5)
RDW: 12.6 % (ref 11.5–15.5)
WBC: 10.9 10*3/uL — ABNORMAL HIGH (ref 4.0–10.5)
WBC: 13 10*3/uL — ABNORMAL HIGH (ref 4.0–10.5)
WBC: 17.1 10*3/uL — ABNORMAL HIGH (ref 4.0–10.5)

## 2011-01-14 LAB — PREGNANCY, URINE: Preg Test, Ur: NEGATIVE

## 2011-01-14 LAB — URINE CULTURE

## 2011-01-14 LAB — DIFFERENTIAL
Basophils Absolute: 0 10*3/uL (ref 0.0–0.1)
Basophils Absolute: 0.1 10*3/uL (ref 0.0–0.1)
Basophils Relative: 1 % (ref 0–1)
Basophils Relative: 0 % (ref 0–1)
Eosinophils Absolute: 1.4 10*3/uL — ABNORMAL HIGH (ref 0.0–0.7)
Eosinophils Relative: 10 % — ABNORMAL HIGH (ref 0–5)
Eosinophils Relative: 10 % — ABNORMAL HIGH (ref 0–5)
Eosinophils Relative: 13 % — ABNORMAL HIGH (ref 0–5)
Eosinophils Relative: 5 % (ref 0–5)
Lymphocytes Relative: 14 % (ref 12–46)
Lymphocytes Relative: 20 % (ref 12–46)
Lymphocytes Relative: 26 % (ref 12–46)
Lymphs Abs: 1.7 10*3/uL (ref 0.7–4.0)
Lymphs Abs: 2.8 10*3/uL (ref 0.7–4.0)
Lymphs Abs: 3.3 10*3/uL (ref 0.7–4.0)
Monocytes Absolute: 0.7 10*3/uL (ref 0.1–1.0)
Monocytes Absolute: 0.7 10*3/uL (ref 0.1–1.0)
Monocytes Absolute: 0.9 10*3/uL (ref 0.1–1.0)
Monocytes Relative: 5 % (ref 3–12)
Monocytes Relative: 8 % (ref 3–12)
Neutro Abs: 6 10*3/uL (ref 1.7–7.7)
Neutro Abs: 9.2 10*3/uL — ABNORMAL HIGH (ref 1.7–7.7)
Neutro Abs: 8 10*3/uL — ABNORMAL HIGH (ref 1.7–7.7)
Neutrophils Relative %: 63 % (ref 43–77)
Neutrophils Relative %: 66 % (ref 43–77)

## 2011-01-14 LAB — T4, FREE: Free T4: 1.11 ng/dL (ref 0.80–1.80)

## 2011-01-14 LAB — CULTURE, BLOOD (ROUTINE X 2): Culture: NO GROWTH

## 2011-01-14 LAB — URINALYSIS, ROUTINE W REFLEX MICROSCOPIC
Bilirubin Urine: NEGATIVE
Hgb urine dipstick: NEGATIVE
Ketones, ur: NEGATIVE mg/dL
Nitrite: NEGATIVE
Nitrite: NEGATIVE
Protein, ur: NEGATIVE mg/dL
Specific Gravity, Urine: 1.01 (ref 1.005–1.030)
Urobilinogen, UA: 0.2 mg/dL (ref 0.0–1.0)

## 2011-01-14 LAB — HEPATIC FUNCTION PANEL
ALT: 30 U/L (ref 0–35)
Albumin: 3.8 g/dL (ref 3.5–5.2)
Bilirubin, Direct: 0.1 mg/dL (ref 0.0–0.3)
Bilirubin, Direct: 0.1 mg/dL (ref 0.0–0.3)
Indirect Bilirubin: 0 mg/dL — ABNORMAL LOW (ref 0.3–0.9)
Total Bilirubin: 0.1 mg/dL — ABNORMAL LOW (ref 0.3–1.2)
Total Bilirubin: 0.8 mg/dL (ref 0.3–1.2)

## 2011-01-14 LAB — VANCOMYCIN, TROUGH
Vancomycin Tr: 12.2 ug/mL (ref 10.0–20.0)
Vancomycin Tr: <5 ug/mL — ABNORMAL LOW (ref 10.0–20.0)

## 2011-01-14 LAB — STREP PNEUMONIAE URINARY ANTIGEN: Strep Pneumo Urinary Antigen: NEGATIVE

## 2011-01-14 LAB — POCT PREGNANCY, URINE: Preg Test, Ur: NEGATIVE

## 2011-01-14 LAB — ETHANOL: Alcohol, Ethyl (B): <5 mg/dL (ref 0–10)

## 2011-01-14 LAB — POCT CARDIAC MARKERS
CKMB, poc: <1 ng/mL — ABNORMAL LOW (ref 1.0–8.0)
Myoglobin, poc: 39.4 ng/mL (ref 12–200)

## 2011-01-14 LAB — LIPASE, BLOOD: Lipase: 20 U/L (ref 11–59)

## 2011-01-14 LAB — LEGIONELLA ANTIGEN, URINE: Legionella Antigen, Urine: NEGATIVE

## 2011-01-14 LAB — D-DIMER, QUANTITATIVE: D-Dimer, Quant: 0.25 ug/mL-FEU (ref 0.00–0.48)

## 2011-01-14 LAB — BRAIN NATRIURETIC PEPTIDE: Pro B Natriuretic peptide (BNP): <30 pg/mL (ref 0.0–100.0)

## 2011-01-14 LAB — GC/CHLAMYDIA PROBE AMP, URINE: Chlamydia, Swab/Urine, PCR: NEGATIVE

## 2011-01-15 LAB — URINALYSIS, ROUTINE W REFLEX MICROSCOPIC
Bilirubin Urine: NEGATIVE
Glucose, UA: NEGATIVE mg/dL
Glucose, UA: NEGATIVE mg/dL
Ketones, ur: NEGATIVE mg/dL
Leukocytes, UA: NEGATIVE
Nitrite: POSITIVE — AB
Nitrite: NEGATIVE
Protein, ur: NEGATIVE mg/dL
Protein, ur: NEGATIVE mg/dL
Specific Gravity, Urine: 1.024 (ref 1.005–1.030)
Urobilinogen, UA: 1 mg/dL (ref 0.0–1.0)
Urobilinogen, UA: 0.2 mg/dL (ref 0.0–1.0)
pH: 7 (ref 5.0–8.0)

## 2011-01-15 LAB — DIFFERENTIAL
Basophils Absolute: 0.2 K/uL — ABNORMAL HIGH (ref 0.0–0.1)
Basophils Relative: 1 % (ref 0–1)
Basophils Relative: 1 % (ref 0–1)
Eosinophils Absolute: 0 10*3/uL (ref 0.0–0.7)
Eosinophils Absolute: 0.2 10*3/uL (ref 0.0–0.7)
Eosinophils Relative: 0 % (ref 0–5)
Lymphocytes Relative: 12 % (ref 12–46)
Lymphs Abs: 1.4 K/uL (ref 0.7–4.0)
Lymphs Abs: 2.8 10*3/uL (ref 0.7–4.0)
Monocytes Absolute: 0.8 K/uL (ref 0.1–1.0)
Monocytes Absolute: 0.5 10*3/uL (ref 0.1–1.0)
Monocytes Relative: 7 % (ref 3–12)
Monocytes Relative: 6 % (ref 3–12)
Neutro Abs: 9.3 K/uL — ABNORMAL HIGH (ref 1.7–7.7)
Neutrophils Relative %: 79 % — ABNORMAL HIGH (ref 43–77)

## 2011-01-15 LAB — CBC
HCT: 37.1 % (ref 36.0–46.0)
Hemoglobin: 13.1 g/dL (ref 12.0–15.0)
MCH: 30.8 pg (ref 26.0–34.0)
MCHC: 35.2 g/dL (ref 30.0–36.0)
MCV: 87.4 fL (ref 78.0–100.0)
Platelets: 313 10*3/uL (ref 150–400)
Platelets: 301 10*3/uL (ref 150–400)
RBC: 4.24 MIL/uL (ref 3.87–5.11)
RDW: 12.1 % (ref 11.5–15.5)
RDW: 12.3 % (ref 11.5–15.5)
WBC: 11.8 K/uL — ABNORMAL HIGH (ref 4.0–10.5)
WBC: 9 10*3/uL (ref 4.0–10.5)

## 2011-01-15 LAB — URINE CULTURE
Colony Count: NO GROWTH
Culture  Setup Time: 201110011251
Culture: NO GROWTH

## 2011-01-15 LAB — COMPREHENSIVE METABOLIC PANEL
ALT: 79 U/L — ABNORMAL HIGH (ref 0–35)
Albumin: 3.9 g/dL (ref 3.5–5.2)
Alkaline Phosphatase: 82 U/L (ref 39–117)
GFR calc Af Amer: >60 mL/min (ref >60)
Potassium: 4.4 mEq/L (ref 3.5–5.1)
Sodium: 142 mEq/L (ref 135–145)
Total Protein: 7.4 g/dL (ref 6.0–8.3)

## 2011-01-15 LAB — BASIC METABOLIC PANEL
BUN: 6 mg/dL (ref 6–23)
CO2: 25 mEq/L (ref 19–32)
Calcium: 8.9 mg/dL (ref 8.4–10.5)
Creatinine, Ser: 0.65 mg/dL (ref 0.4–1.2)
Glucose, Bld: 114 mg/dL — ABNORMAL HIGH (ref 70–99)

## 2011-01-15 LAB — BASIC METABOLIC PANEL WITH GFR
Chloride: 107 meq/L (ref 96–112)
GFR calc Af Amer: >60 mL/min (ref >60)
GFR calc non Af Amer: >60 mL/min (ref >60)
Potassium: 3.7 meq/L (ref 3.5–5.1)
Sodium: 138 meq/L (ref 135–145)

## 2011-01-15 LAB — URINE MICROSCOPIC-ADD ON

## 2011-01-15 LAB — PREGNANCY, URINE: Preg Test, Ur: NEGATIVE

## 2011-01-15 LAB — RAPID URINE DRUG SCREEN, HOSP PERFORMED
Amphetamines: NOT DETECTED
Barbiturates: NOT DETECTED
Benzodiazepines: POSITIVE — AB
Cocaine: NOT DETECTED
Opiates: POSITIVE — AB
Tetrahydrocannabinol: NOT DETECTED

## 2011-01-15 LAB — ETHANOL: Alcohol, Ethyl (B): 5 mg/dL (ref 0–10)

## 2011-01-15 LAB — TRICYCLICS SCREEN, URINE: TCA Scrn: NOT DETECTED

## 2011-01-16 LAB — COMPREHENSIVE METABOLIC PANEL
ALT: 71 U/L — ABNORMAL HIGH (ref 0–35)
Alkaline Phosphatase: 80 U/L (ref 39–117)
BUN: 8 mg/dL (ref 6–23)
CO2: 25 mEq/L (ref 19–32)
Calcium: 8.9 mg/dL (ref 8.4–10.5)
GFR calc non Af Amer: >60 mL/min (ref >60)
Glucose, Bld: 90 mg/dL (ref 70–99)
Sodium: 138 mEq/L (ref 135–145)

## 2011-01-16 LAB — URINALYSIS, ROUTINE W REFLEX MICROSCOPIC
Bilirubin Urine: NEGATIVE
Glucose, UA: NEGATIVE mg/dL
Hgb urine dipstick: NEGATIVE
Ketones, ur: NEGATIVE mg/dL
Protein, ur: NEGATIVE mg/dL
Urobilinogen, UA: 0.2 mg/dL (ref 0.0–1.0)

## 2011-01-16 LAB — CBC
HCT: 40.3 % (ref 36.0–46.0)
Hemoglobin: 14.1 g/dL (ref 12.0–15.0)
MCHC: 35 g/dL (ref 30.0–36.0)
MCV: 86.4 fL (ref 78.0–100.0)

## 2011-01-16 LAB — DIFFERENTIAL
Basophils Relative: 1 % (ref 0–1)
Eosinophils Absolute: 0.1 10*3/uL (ref 0.0–0.7)
Neutro Abs: 7.3 10*3/uL (ref 1.7–7.7)
Neutrophils Relative %: 68 % (ref 43–77)

## 2011-01-16 LAB — POCT PREGNANCY, URINE: Preg Test, Ur: NEGATIVE

## 2011-01-17 LAB — URINALYSIS, ROUTINE W REFLEX MICROSCOPIC
Nitrite: NEGATIVE
Protein, ur: NEGATIVE mg/dL
Specific Gravity, Urine: 1.02 (ref 1.005–1.030)
Urobilinogen, UA: 0.2 mg/dL (ref 0.0–1.0)

## 2011-01-17 LAB — GC/CHLAMYDIA PROBE AMP, GENITAL
Chlamydia, DNA Probe: NEGATIVE
GC Probe Amp, Genital: NEGATIVE

## 2011-01-17 LAB — WET PREP, GENITAL
Trich, Wet Prep: NONE SEEN
Yeast Wet Prep HPF POC: NONE SEEN

## 2011-01-17 LAB — POCT PREGNANCY, URINE: Preg Test, Ur: NEGATIVE

## 2011-01-20 LAB — DIFFERENTIAL
Eosinophils Absolute: 0.2 10*3/uL (ref 0.0–0.7)
Lymphs Abs: 2.7 10*3/uL (ref 0.7–4.0)
Monocytes Relative: 6 % (ref 3–12)
Neutro Abs: 6.6 10*3/uL (ref 1.7–7.7)
Neutrophils Relative %: 65 % (ref 43–77)

## 2011-01-20 LAB — URINALYSIS, ROUTINE W REFLEX MICROSCOPIC
Ketones, ur: NEGATIVE mg/dL
Leukocytes, UA: NEGATIVE
Nitrite: NEGATIVE
Specific Gravity, Urine: 1.03 (ref 1.005–1.030)
pH: 6 (ref 5.0–8.0)

## 2011-01-20 LAB — COMPREHENSIVE METABOLIC PANEL
ALT: 80 U/L — ABNORMAL HIGH (ref 0–35)
Calcium: 8.9 mg/dL (ref 8.4–10.5)
Glucose, Bld: 86 mg/dL (ref 70–99)
Sodium: 135 mEq/L (ref 135–145)
Total Protein: 7.6 g/dL (ref 6.0–8.3)

## 2011-01-20 LAB — PREGNANCY, URINE: Preg Test, Ur: NEGATIVE

## 2011-01-20 LAB — CBC
Hemoglobin: 13.8 g/dL (ref 12.0–15.0)
MCHC: 35.6 g/dL (ref 30.0–36.0)
RDW: 12.2 % (ref 11.5–15.5)

## 2011-01-20 LAB — URINE MICROSCOPIC-ADD ON

## 2011-02-04 LAB — CBC
HCT: 36.3 % (ref 36.0–46.0)
Hemoglobin: 12.6 g/dL (ref 12.0–15.0)
MCHC: 34.8 g/dL (ref 30.0–36.0)
MCV: 84.8 fL (ref 78.0–100.0)
RBC: 4.28 MIL/uL (ref 3.87–5.11)

## 2011-02-04 LAB — URINALYSIS, ROUTINE W REFLEX MICROSCOPIC
Bilirubin Urine: NEGATIVE
Nitrite: NEGATIVE
Protein, ur: NEGATIVE mg/dL
Specific Gravity, Urine: >1.03 — ABNORMAL HIGH (ref 1.005–1.030)
Urobilinogen, UA: 0.2 mg/dL (ref 0.0–1.0)

## 2011-02-04 LAB — COMPREHENSIVE METABOLIC PANEL
CO2: 29 mEq/L (ref 19–32)
Calcium: 8.9 mg/dL (ref 8.4–10.5)
Creatinine, Ser: 0.67 mg/dL (ref 0.4–1.2)
GFR calc non Af Amer: >60 mL/min (ref >60)
Glucose, Bld: 86 mg/dL (ref 70–99)

## 2011-02-04 LAB — PREGNANCY, URINE: Preg Test, Ur: NEGATIVE

## 2011-02-04 LAB — DIFFERENTIAL
Basophils Absolute: 0 10*3/uL (ref 0.0–0.1)
Eosinophils Absolute: 0.2 10*3/uL (ref 0.0–0.7)
Lymphocytes Relative: 50 % — ABNORMAL HIGH (ref 12–46)
Lymphs Abs: 2.6 10*3/uL (ref 0.7–4.0)
Neutrophils Relative %: 38 % — ABNORMAL LOW (ref 43–77)

## 2011-02-04 LAB — LIPASE, BLOOD: Lipase: 15 U/L (ref 11–59)

## 2011-02-05 LAB — URINALYSIS, ROUTINE W REFLEX MICROSCOPIC
Bilirubin Urine: NEGATIVE
Glucose, UA: NEGATIVE mg/dL
Ketones, ur: NEGATIVE mg/dL
Protein, ur: NEGATIVE mg/dL

## 2011-02-05 LAB — DIFFERENTIAL
Basophils Relative: 0 % (ref 0–1)
Eosinophils Absolute: 0.6 10*3/uL (ref 0.0–0.7)
Eosinophils Relative: 6 % — ABNORMAL HIGH (ref 0–5)
Monocytes Absolute: 0.9 10*3/uL (ref 0.1–1.0)
Monocytes Relative: 10 % (ref 3–12)

## 2011-02-05 LAB — COMPREHENSIVE METABOLIC PANEL
Albumin: 3.8 g/dL (ref 3.5–5.2)
BUN: 5 mg/dL — ABNORMAL LOW (ref 6–23)
Calcium: 9.2 mg/dL (ref 8.4–10.5)
Creatinine, Ser: 0.71 mg/dL (ref 0.4–1.2)
Glucose, Bld: 86 mg/dL (ref 70–99)
Potassium: 3.7 mEq/L (ref 3.5–5.1)
Total Protein: 7.8 g/dL (ref 6.0–8.3)

## 2011-02-05 LAB — CBC
HCT: 38 % (ref 36.0–46.0)
Hemoglobin: 13.2 g/dL (ref 12.0–15.0)
MCHC: 34.6 g/dL (ref 30.0–36.0)
MCV: 85.5 fL (ref 78.0–100.0)
Platelets: 266 10*3/uL (ref 150–400)
RDW: 12.8 % (ref 11.5–15.5)

## 2011-02-08 LAB — URINALYSIS, ROUTINE W REFLEX MICROSCOPIC
Glucose, UA: NEGATIVE mg/dL
pH: 5.5 (ref 5.0–8.0)

## 2011-02-08 LAB — DIFFERENTIAL
Basophils Absolute: 0 10*3/uL (ref 0.0–0.1)
Basophils Relative: 0 % (ref 0–1)
Basophils Relative: 1 % (ref 0–1)
Eosinophils Absolute: 0 10*3/uL (ref 0.0–0.7)
Eosinophils Relative: 3 % (ref 0–5)
Lymphocytes Relative: 47 % — ABNORMAL HIGH (ref 12–46)
Monocytes Relative: 2 % — ABNORMAL LOW (ref 3–12)
Neutro Abs: 2.5 10*3/uL (ref 1.7–7.7)
Neutrophils Relative %: 87 % — ABNORMAL HIGH (ref 43–77)

## 2011-02-08 LAB — CBC
HCT: 29.1 % — ABNORMAL LOW (ref 36.0–46.0)
MCHC: 34.2 g/dL (ref 30.0–36.0)
MCV: 77.8 fL — ABNORMAL LOW (ref 78.0–100.0)
Platelets: 232 10*3/uL (ref 150–400)
RBC: 4.78 MIL/uL (ref 3.87–5.11)
RDW: 15.4 % (ref 11.5–15.5)
RDW: 15.5 % (ref 11.5–15.5)

## 2011-02-08 LAB — URINE CULTURE
Colony Count: NO GROWTH
Culture: NO GROWTH

## 2011-02-08 LAB — BASIC METABOLIC PANEL
BUN: 6 mg/dL (ref 6–23)
BUN: 8 mg/dL (ref 6–23)
CO2: 27 mEq/L (ref 19–32)
Calcium: 8.5 mg/dL (ref 8.4–10.5)
Chloride: 107 mEq/L (ref 96–112)
Creatinine, Ser: 0.85 mg/dL (ref 0.4–1.2)
GFR calc Af Amer: >60 mL/min (ref >60)
GFR calc non Af Amer: >60 mL/min (ref >60)
Glucose, Bld: 194 mg/dL — ABNORMAL HIGH (ref 70–99)
Glucose, Bld: 91 mg/dL (ref 70–99)

## 2011-02-08 LAB — HEPATIC FUNCTION PANEL
ALT: 29 U/L (ref 0–35)
Albumin: 4.3 g/dL (ref 3.5–5.2)
Alkaline Phosphatase: 71 U/L (ref 39–117)
Indirect Bilirubin: 0.2 mg/dL — ABNORMAL LOW (ref 0.3–0.9)
Total Protein: 5.7 g/dL — ABNORMAL LOW (ref 6.0–8.3)
Total Protein: 7.6 g/dL (ref 6.0–8.3)

## 2011-02-08 LAB — RAPID URINE DRUG SCREEN, HOSP PERFORMED
Amphetamines: NOT DETECTED
Benzodiazepines: POSITIVE — AB

## 2011-02-08 LAB — PREGNANCY, URINE: Preg Test, Ur: NEGATIVE

## 2011-02-16 LAB — COMPREHENSIVE METABOLIC PANEL
ALT: 22 U/L (ref 0–35)
Alkaline Phosphatase: 74 U/L (ref 39–117)
BUN: 9 mg/dL (ref 6–23)
CO2: 24 mEq/L (ref 19–32)
Chloride: 101 mEq/L (ref 96–112)
GFR calc non Af Amer: >60 mL/min (ref >60)
Glucose, Bld: 108 mg/dL — ABNORMAL HIGH (ref 70–99)
Potassium: 4.3 mEq/L (ref 3.5–5.1)
Total Bilirubin: 0.3 mg/dL (ref 0.3–1.2)
Total Protein: 8 g/dL (ref 6.0–8.3)

## 2011-02-16 LAB — PROTIME-INR
INR: 1 (ref 0.00–1.49)
Prothrombin Time: 12.9 seconds (ref 11.6–15.2)

## 2011-02-16 LAB — CBC
HCT: 39.8 % (ref 36.0–46.0)
Hemoglobin: 12.9 g/dL (ref 12.0–15.0)
RBC: 4.94 MIL/uL (ref 3.87–5.11)
RDW: 13.8 % (ref 11.5–15.5)

## 2011-02-16 LAB — DIFFERENTIAL
Basophils Absolute: 0 10*3/uL (ref 0.0–0.1)
Basophils Relative: 0 % (ref 0–1)
Eosinophils Absolute: 0 10*3/uL (ref 0.0–0.7)
Neutro Abs: 6 10*3/uL (ref 1.7–7.7)
Neutrophils Relative %: 78 % — ABNORMAL HIGH (ref 43–77)

## 2011-02-16 LAB — URINALYSIS, ROUTINE W REFLEX MICROSCOPIC
Glucose, UA: NEGATIVE mg/dL
Hgb urine dipstick: NEGATIVE
Ketones, ur: NEGATIVE mg/dL
Protein, ur: NEGATIVE mg/dL
pH: 7 (ref 5.0–8.0)

## 2011-02-16 LAB — APTT: aPTT: 27 seconds (ref 24–37)

## 2011-03-17 NOTE — Discharge Summary (Signed)
[name], Jaime [name]              ACCOUNT NO.:  [id number]   MEDICAL RECORD NO.:  [id number]          PATIENT TYPE:  INP   LOCATION:  M629                          FACILITY:  [facility]   PHYSICIAN:  Salem Caster, DO    DATE OF BIRTH:  Mar 16, 1986   DATE OF ADMISSION:  06/10/2007  DATE OF DISCHARGE:  08/13/2008LH                               DISCHARGE SUMMARY   PRIMARY CARE PHYSICIAN:  Sherrilee Gilles. [name], MD   CONSULTANTS:  Gastroenterology, Caro Hight, MD   ADMISSION DIAGNOSIS:  Recurrent abdominal pain.   DISCHARGE DIAGNOSES:  1. Abdominal pain, unknown etiology.  2. Anal fissure.   BRIEF HOSPITAL COURSE:  Ms. [name] is a 25 year old Caucasian female  who presented after being discharged from [facility] on June 02, 2007, for similar symptoms.  At that time the patient was admitted  and seen by gastroenterology.  EGD was done and was found to have a  pyloric ulcer, otherwise not much significant findings.  She was  discharged on PPI and Sucralfate and presented back June 11, 2007, with  similar complaints.  CT of her abdomen was performed and showed no acute  abnormalities as well as no abnormalities on the CT of her pelvis.  GI  was again consulted and a limited colonoscopy was performed which showed  formed stool in the right colon, which had limited views.  There was  some rare sigmoid diverticulosis, a possible anal fissure.  No polyps,  masses, or inflammatory changes or AVMs were seen.  The patient's workup  with GI was pretty much negative.  Other differential was functional  abdominal pain, low likelihood of eosinophilic gastroenteritis.  The  patient was placed on a low-fat, low-residue diet.  Flora-Q daily was  added and GI discussed a transfer possibly to [facility] for  further evaluation.  It was decided that the patient can have it worked  up at [facility] as an outpatient.  The patient was  maintained on IV pain medication.  At  this time the patient's pain has  improved and we feel the patient is stable enough for discharge at this  time.   Vitals on discharge:  Temperature was 97.8, pulse 82, respirations 20,  blood pressure is 106/58.  She is saturating at 98%.  Sodium 136,  potassium 4.0, chloride 106, CO2 25, glucose 97, BUN 6, creatinine 0.62.  White count is 10.3, hemoglobin 12.1, hematocrit 35.6, platelets 355.   DISCHARGE CONDITION:  The patient is in stable condition.   DISPOSITION:  Will be discharged to home on the following medications:   1. Phenergan 12.5 mg q.4-6h. as needed.  2. Carafate 10 mg twice a day as needed.  3. Protonix 40 mg daily.  4. Benadryl 50 mg as needed.  5. Tylenol P.M. as needed.  6. Imitrex also as needed.  7. Tylox one to two tablets p.o. q.6h. p.r.n.   INSTRUCTIONS:  The patient is to maintain a low-fat, low-residue diet,  increase as tolerated.  The patient is to follow up with Dr. [name] in  the next 1-2 weeks.  The patient also per GI will be contacted regarding  her outpatient workup at [facility].  The  patient was recommended that she follow up with these appointments.      Salem Caster, DO  Electronically Signed     SM/MEDQ  D:  06/15/2007  T:  06/16/2007  Job:  [id number]   cc:   Sherrilee Gilles. [name], MD  Fax: [phone]

## 2011-03-17 NOTE — Group Therapy Note (Signed)
Jaime [name], Jaime [name]              ACCOUNT NO.:  [id number]   MEDICAL RECORD NO.:  [id number]          PATIENT TYPE:  INP   LOCATION:  A316                          FACILITY:  [facility]   PHYSICIAN:  Bonnielee Haff, MD     DATE OF BIRTH:  27-May-1986   DATE OF PROCEDURE:  05/31/2007  DATE OF DISCHARGE:                                 PROGRESS NOTE   SUBJECTIVE:  The patient is still having 7/10 abdominal pain in the  epigastrium and the right upper quadrant.  No nausea, vomiting.  Her  diarrhea had stopped as of yesterday afternoon.  She is able to tolerate  a liquid diet at this time.   OBJECTIVE:  VITAL SIGNS:  Stable.  She has been afebrile.  ABDOMEN:  Soft with slight tenderness in the epigastrium and the right  upper quadrant but much improved from a couple of days ago.  Bowel  sounds are present.  No mass or organomegaly is appreciated.   Labs:  No labs available today.  All the labs have been stable.  LFTs  have been normal so far.   ASSESSMENT AND PLAN:  This is a 25 year old Caucasian female with a  history of chronic back pain, who presented with abdominal pain and was  found to have gastric ulcers.  The patient underwent an EGD which showed  these findings yesterday.  She also has undergone evaluations to rule  out biliary dyskinesis.  Her HIDA scan did show normal ejection fraction  with no cystic ductal obstruction.  Hence, I think the etiology for her  pain is gastric ulceration.  Her symptoms are somewhat out of proportion  to her findings.  Today I will try to transition her to p.o. oxycodone.  Diet has been advanced.  If she tolerates her diet and she has no more  diarrhea and if her pain is reasonably controlled with p.o. oxycodone, I  think she may be able to go home tomorrow or the day after.  GI is  primarily managing this issue.  We are assisting with pain control and  other issues.   She also has migraine headaches, which are stable.  She has an  appointment  with her back specialist soon, which she will keep.  Otherwise, no other active medical issues.      Bonnielee Haff, MD  Electronically Signed     GK/MEDQ  D:  05/31/2007  T:  05/31/2007  Job:  [id number]   cc:   Sherrilee Gilles. [name], MD  Fax: [phone]   R. Garfield Cornea, M.D.  [address]  [place]  [place] [address]   Caro Hight, M.D.  [address]  [place] , [place] [address]
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
## 2011-03-17 NOTE — H&P (Signed)
Jaime [name], Jaime [name]              ACCOUNT NO.:  [id number]   MEDICAL RECORD NO.:  [id number]          PATIENT TYPE:  OBV   LOCATION:  D326                          FACILITY:  [facility]   PHYSICIAN:  Bonnielee Haff, MD     DATE OF BIRTH:  [birth year]   DATE OF ADMISSION:  09/03/2007  DATE OF DISCHARGE:  LH                              HISTORY & PHYSICAL   PRIMARY CARE PHYSICIAN:  Dr. Redmond School   ADMISSION DIAGNOSES:  1. Questionable partial small bowel obstruction.  2. Abdominal pain.  3. Chronic abdominal pain of unclear etiology.  4. History of migraine headaches.  5. Obesity.   CHIEF COMPLAINT:  Abdominal pain since this morning.   HISTORY OF PRESENT ILLNESS:  The patient is a 25 year old Caucasian  female who is obese, who has a history of abdominal pain which has been  ongoing since July of 2008.  The patient has had extensive evaluation  both here at [facility] as well as at [facility] and no  clear etiology has been found.  She has been told that she could have a  sphincter of Oddi dysfunction but she has not undergone any kind of  studies for this.   The patient was in her usual state of health until this morning when she  suddenly woke up from sleep with severe abdominal pain.  The pain is  sharp, stabbing pain located in the right side of her abdomen, a 10/10  in intensity with no radiation.  No precipitating, aggravating or  relieving factors are identified.  There was no fever. Some nausea but  no emesis.  She took her oral Dilaudid with no relief and decided to  come to the emergency department.   She did have a cholecystectomy done at [facility] on July 22, 2007  which was done laparoscopically. She was discharged from [facility] about 5  days ago after evaluation for abdominal pain there.  She said today when  she started having the pain she called her doctor and then she called  the [facility] and they asked her to come to the ED here.  She apparently had a lymph node biopsy as well while she was evaluated  at [facility] and the biopsy results are not available.  I am not even sure  of the location of this lymph node at this time.  The other studies that  she has had include a colonoscopy done her August 12th, it was limited  because of stool; rare diverticulosis was found and no other clear  findings.  She also had an EGD on July 28th which showed prepyloric  gastric ulcerations. Biopsies were done which did not show any kind of  active inflammation or metastasis or neoplasia.  The colon random biopsy  did not show any kind of abnormality either.  She has also had an EUS at  [facility] and I do not have the findings of that test.   MEDICATIONS AT HOME:  1. Flexeril 10 mg t.i.d.  2. Imitrex p.r.n.  3. Dilaudid 2 mg x4 daily.  4. Valium 10 mg  t.i.d.  5. Protonix 40 mg b.i.d.  6. Carafate 10 cc b.i.d.  7. Phenergan 25 mg p.r.n.  8. Klonopin 0.5 mg q.h.s.  9. Imodium p.r.n.  10.Depo Provera shot for contraception.   ALLERGIES:  TORADOL (caused difficulty in breathing). No other allergies  reported.   PAST MEDICAL HISTORY:  1. Significant for abdominal pain since July and this had been      described above in detail.  2. Cholecystectomy done at [facility] laparoscopically.  3. Migraine headaches.  4. Insomnia.  5. Low back pain.   PAST SURGICAL HISTORY:  C-section and laparoscopic cholecystectomy.   SOCIAL HISTORY:  Lives in [place] with family. No smoking, alcohol or  illicit drug use. She is currently on disability.   FAMILY HISTORY:  Positive for colon cancer, hypertension, and  dyslipidemia.   REVIEW OF SYSTEMS:  GENERAL:  Positive for weakness.  HEENT:  Unremarkable.  CARDIOVASCULAR:  Unremarkable.  RESPIRATORY:  Unremarkable. GI:  As in HPI.  GU:  Unremarkable.  MUSCULOSKELETAL:  Unremarkable.  ENDOCRINE:  Unremarkable.  NEUROLOGIC: Unremarkable.  PSYCHIATRIC:  Positive for anxiety.  Other systems  unremarkable.   PHYSICAL EXAMINATION:  VITAL SIGNS:  Temperature was 97.2, heart rate  140 on presentation, currently 112 at last recording.  Blood pressure  120/79. Saturation 93% on room air.  GENERAL:  Obese white female in slight discomfort but in no distress.  HEENT:  There is no pallor noticed. No icterus. Oral mucosa is moist, no  oral lesions are noted.  NECK:  Soft and supple, no thyromegaly is present.  LUNGS:  Clear to auscultation bilaterally.  CARDIOVASCULAR:  S1, S2 slightly tachycardic, regular, no murmur is  appreciated.  ABDOMEN:  Obese, there is right lower quadrant tenderness, mild in the  epigastric area.  EXTREMITIES:  Without edema. Pulses palpable.   LABORATORY DATA:  CBC pretty unremarkable. CMET unremarkable. CG  negative. Urinalysis unremarkable.   She had a CT scan that showed possible mild partial SBO.   ASSESSMENT:  This is a 25 year old Caucasian female who has medical  problems as discussed earlier, who presents with abdominal pain. She has  had abdominal pain since July, about 4 months or so.  Etiology for this  pain has not been determined despite evaluations at [facility].  She has  been told that she may have sphincter of Oddi dysfunction though the  pain is located mostly in the right lower quadrant.  CT this time shows  a possible partial mild SBO. I do not know if that is what is causing  her pain.   PLAN:  1. Abdominal pain, chronic with acute exacerbation.  She has been      evaluated by Dr. Geroge Baseman who does not think she needs surgical      treatment at this time.  We will admit her for pain control.  We      will have him follow in the morning as well as consulting GI to see      if they have anything else to offer.  I think when the pain is      controlled she will be discharged and then she needs to follow up      with her primary care physician as well as with the [facility].  Unclear if she has ever had an ERCP because she  did      mention the possibility of sphincter of Oddi dysfunction.  She  might need to have the pressures measured there as well.  At any      rate I would defer this issue to GI if they think she needs urgent      evaluation at [facility].  2. Other medical issues are all stable.  We will put her on GI      prophylaxis.  Keep her n.p.o. for now.  She does not need an NG      tube at present and she is not having any emesis.  If she starts      vomiting we have an NG tube placed.   Further management decision will depend on patient's improvement and  response to treatment.      Bonnielee Haff, MD  Electronically Signed     GK/MEDQ  D:  09/03/2007  T:  09/03/2007  Job:  [id number]   cc:   Sherrilee Gilles. [name], MD  Fax: [phone]   Caro Hight, M.D.  [address]  [place] , [place] [address]

## 2011-03-17 NOTE — H&P (Signed)
[name], [name]              ACCOUNT NO.:  [id number]   MEDICAL RECORD NO.:  [id number]          PATIENT TYPE:  INP   LOCATION:  V893                          FACILITY:  [facility]   PHYSICIAN:  Merry Lofty, MD   DATE OF BIRTH:  November 02, 1986   DATE OF ADMISSION:  06/10/2007  DATE OF DISCHARGE:  LH                              HISTORY & PHYSICAL   PRIMARY MEDICAL DOCTOR:  Dr. [name].   CHIEF COMPLAINT:  Abdominal pain.   HISTORY OF PRESENT ILLNESS:  [name] is a 25 year old female patient  who was discharged from the hospital on July 31, after she stayed in the  hospital for the same problem with abdominal pain.  She was assessed  while she was in the hospital last admission by GI, and EGD was done and  a pyloric ulcer was found; otherwise, it was without much finding.  She  was discharged with a PPI and sucralfate, but she presented with the  same complaint yesterday.  She stated that she had pain which is  intermittent, and sometimes it wakes up her from sleep and stayed for  hours, and the last 2 days she has continuous abdominal epigastric area  pain.  As per the patient otherwise, she has nausea, no vomiting, no  diarrhea, and she denied any urinary complaint as well.  She has no  chest pain, no cough or no palpitation.  Before, she stated she used to  have back pain but no abdominal pain, and she denied any vaginal  discharge as well.   REVIEW OF SYSTEMS:  A 10-point review of system is dictated on the HPI.   ALLERGIES:  NO KNOWN DRUG ALLERGIES.   SOCIAL HISTORY:  She works at the nursing home, and she denies smoking  or drinking or uses any drug.   PAST MEDICAL HISTORY:  1. Migraine headaches.  2. Insomnia.  3. Low back pain.  4. Abdominal pain for the last one month.   HOME MEDICATIONS:  She was discharged last time with:  1. Dilaudid 4 mg p.o. every 2 hours.  2. Phenergan 12.5 mg every 6 hours p.r.n.  3. Sucralfate 1 gram twice daily.  4. Protonix 40 mg p.o.  daily.  5. Benadryl 50 mg p.r.n.  6. Tylenol DM p.r.n.  7. Imitrex injections p.r.n., unknown dose.   PHYSICAL EXAMINATION:  GENERAL:  The patient is lying on the bed.  There  is not any sign of pain, distress or respiratory distress.  VITALS:  Blood pressure is within normal range, 132 x 80, and pulse is  84, temperature 98 and respiratory rate is 16.  Saturation is 96% on  room air.  HEENT:  She has pink conjunctivae.  Nonicteric sclerae.  NECK:  Supple.  CHEST:  Good air entry bilaterally.  CVS:  S1 is well-heard.  No murmur.  ABDOMEN:  Obese and soft.  Normoactive bowel sounds.  No localized  tenderness.  EXTREMITIES:  No pedal edema.  CNS:  She is alert and oriented.   LABS:  White blood cells is 9.3 and hemoglobin is 12, hematocrit 37.7,  platelet count is 381.  Chemistries:  Sodium 137, potassium 3.5 and  chloride is 106, bicarb 25, glucose is 106 and BUN 4, creatinine 0.5.  Liver function tests within normal range.  ALT is 40, and calcium is  8.9.  Amylase is 12 and lipase is 15.  Urinalysis is negative.  CAT scan  of the abdomen and pelvis was done also during admission, and that  showed minimal infiltrate less likely atelectasis of the left lower  lobe, otherwise no significant abnormal finding, and on the pelvic  examination was also no acute intrapelvic abnormality.   ASSESSMENT:  1. Recurrent abdominal pain, unknown etiology, and I will admit her      and will put her on pain medications and watch her, and if      persistent, probably I might need to call also GI for follow-up.  2. Peptic ulcer disease, which was found on      esophagogastroduodenoscopy.  She is on Protonix and the sucralfate.      I will put her on the Protonix b.i.d. p.o. and sucralfate I will      continue the way it was.  Phenergan for her nausea and will monitor      her.  3. Questionable anxiety and depression.  The patient looks very slow      to response and indifferent to the questions,  and I will ask the      __________ team to evaluate her as well.  Otherwise, the patient is      stable and will monitor her, and if she responds, probably she      might be able to be discharged the coming one or two days.      Merry Lofty, MD  Electronically Signed     MT/MEDQ  D:  06/11/2007  T:  06/11/2007  Job:  [id number]

## 2011-03-17 NOTE — Consult Note (Signed)
Jaime [name], [name]              ACCOUNT NO.:  [id number]   MEDICAL RECORD NO.:  [id number]          PATIENT TYPE:  OBV   LOCATION:  B017                          FACILITY:  [facility]   PHYSICIAN:  Chelsea Primus, MD      DATE OF BIRTH:  Mar 30, 1986   DATE OF CONSULTATION:  09/03/2007  DATE OF DISCHARGE:                                 CONSULTATION   REASON FOR CONSULTATION:  Abdominal pain.   HISTORY OF PRESENT ILLNESS:  The patient is a 25 year old morbidly obese  white female with a history of hypertension, anxiety, who presents with  right-sided abdominal pain.  In discussion with the patient, she has had  this pain since July of this year.  It remains fairly constant with some  improvement with pain medication she has been prescribed which include  Dilaudid 1-2 mg every 4 hours orally, Valium 10 mg as needed,  Flexeril  as well as Phenergan.  She has also been on Protonix with no apparent  improvement.  She states that her pain is usually diffuse throughout her  abdomen but occasionally is localized. She describes it as sharp in  Scientist, research (physical sciences).  Again, it is constant, occasionally radiating around to her  back.  She does state that she does feel warm and flushed occasionally  with her symptoms and occasionally does have cold sweat secondary to the  pain.  She does also have occasional nausea, no episodes of emesis.  In  regards to bowel habits, she states these are variable, anything from  hard stools to smooth to watery stools.  Occasionally she does note  blood on the toilet paper and some blood in the toilet bowl after a  recent colonoscopy.  She denies any vaginal discharge.  She denies any  change with periods.  Denies any change with urination.  No foul-  smelling urine, no hematuria.   Up to date, she has had extensive workup for this abdominal pain at  [facility] via the gastroenterology services as well as [facility],  again with their gastroenterology  services.  Since July, she  has had two separate colonoscopies.  Records from [place] are not  currently available, and it is not clear if biopsies were obtained.  Again, in discussion with the patient,  it is suggested that biopsies  were obtained during the time of that colonoscopy.  Her last discharge  from [facility] was approximately 5 days ago.  She is unclear about her  followup. She states that they still had other studies that they wished  to perform on an outpatient basis, but these have not been scheduled,  and, again, she has no followup. She was to follow up with her primary  care physician, Dr. Andria Frames, who is currently on vacation.  On further  discussion with the patient, she states that she had communication with  [facility] as well as with [facility].  She called  Dr. Lowella Bandy office this morning and was told that if the pain did not  improve through the day, that she was to return to [facility]  for definitive transport back to [place],  at this time the  patient states her pain has not changed and is still ongoing.  She has  had a bowel movement today.  It was not bloody.  She does not currently  have any nausea. She has not had episodes of emesis.   PAST MEDICAL HISTORY:  1. Hypertension.  2. Anxiety.  3. Depression.  4. Migraine headaches.   PAST SURGICAL HISTORY:  She had a laparoscopic cholecystectomy.   MEDICATIONS:  Medications were reviewed with patient.  1. Flexeril.  2. Imitrex.  3. Dilaudid.  4. Valium.  5. Protonix.  6. Phenergan.   ALLERGIES:  The patient states allergy to TORADOL with increasing  shortness of breath associated with it.   SOCIAL HISTORY:  The patient denies tobacco use, denies alcohol use,  denies any recreational drugs use or IV drug abuse.   PHYSICAL EXAMINATION:  VITAL SIGNS:  Temperature 97.2, heart rate 89,  respiratory rate 20, blood pressure 116/54, oxygen saturation 97% on  room air.   GENERAL:  Upon entering room, the patient is in a supine position on the  emergency room cart. Her grandmother is present at the bedside with her.  Upon entering the room, the patient became tearful.  She is alert and  oriented x3.  Although tearful, she does not appear to be in any acute  distress.  HEENT:  Pupils equal, round, and reactive to light.  Extraocular  movements are intact.  There is no conjunctival pallor.  There is no  scleral icterus noted.  Oral mucosa is pink and moist.  NECK:  No cervical lymphadenopathy is apparent.  Trachea is midline.  PULMONARY:  She has unlabored respirations.  She is clear to  auscultation bilaterally.  CARDIOVASCULAR:  Regular rate and rhythm.  No murmurs, gallops, or rubs  are apparent.  ABDOMEN:  Positive bowel sounds on auscultation.  Abdomen is soft. She  is morbidly obese.  During auscultation, no tenderness was elicited with  pressure placed  upon stethoscope.  No rebound tenderness is elicited.  With palpation, she does state mild to moderate right lower  and right  upper quadrant abdominal tenderness.  No rebound, no referred elicited.  No hernias or masses are apparent.  EXTREMITIES:  Warm and dry.   LABORATORY DATA:  Urine pregnancy test is negative.  Urine lipase level  was 13.  Basic metabolic panel: Sodium 517, potassium 4.0, chloride 105,  BUN 6, creatinine 0.67, glucose 101, calcium 9.6.  Liver panel is within  normal limits.  Complete blood count:  White blood cell count 8.4,  hemoglobin 12.6, hematocrit 37.3, platelets 432.   CT evaluation of the abdomen and pelvis demonstrates no free air, no  free fluid.  There is no mesenteric fat stranding.  Contrast is noted  throughout the small bowel.   ASSESSMENT AND PLAN:  1. Abdominal pain, unspecified, appears to be chronic in nature.  At      this point,  I had a lengthy conversation with both patient and      patient's grandmother in regards to surgical indications.  At this       time, the patient's abdominal symptoms do not appear acute.  There      is no evidence of any acute surgical indications at this time.      Again, I had a long conversation regarding potential course, and at      this time explained that I have little to  offer other than      continuation of current pain medication regimen which she has at      home.  Based on her long and extensive workup at [facility], I do      recommend that she again communicate her current symptomatology to      both her primary care physician as well as her physicians at      [facility]. I did explain to her that, should her symptoms change or      should she have any change in her laboratory or radiographic      findings or change in her course suggestive of a surgical etiology,      I would be available for surgical intervention. But, again, at this      point in time      I have nothing to offer in discussion with Dr. Nira Retort in the      emergency room department.  The patient understands that there is      no current surgical indication at this point.  I would be available      on an as-needed basis.      Chelsea Primus, MD  Electronically Signed     BZ/MEDQ  D:  09/04/2007  T:  09/04/2007  Job:  [id number]   cc:   [facility]

## 2011-03-17 NOTE — Op Note (Signed)
NAMESEHER, SCHLAGEL              ACCOUNT NO.:  [id number]   MEDICAL RECORD NO.:  [id number]          PATIENT TYPE:  INP   LOCATION:  A316                          FACILITY:  [facility]   PHYSICIAN:  R. Garfield Cornea, M.D. DATE OF BIRTH:  07-29-1986   DATE OF PROCEDURE:  05/30/2007  DATE OF DISCHARGE:                               OPERATIVE REPORT   PROCEDURE:  EGD and biopsy.   INDICATIONS FOR PROCEDURE:  The patient is a 24 year old female admitted  to the hospital with epigastric and right upper quadrant abdominal pain.  She had an  ultrasound last week at [place] which demonstrated  normal gallbladder.  This admission she had a CT scan of the  abdomen  and pelvis which demonstrated no abnormalities to explain her symptoms.  Subsequently she underwent an ultrasound yesterday which demonstrated a  fasting dilated gallbladder, but otherwise it appeared normal (I  reviewed with Dr. Alvester Chou).  In addition, today she underwent a HIDA scan  which I reviewed by Dr. Nyoka Cowden.  She had no evidence of cystic duct  obstruction and a 70% ejection fraction noted.  She has had some  amelioration of symptoms with food and is often awakened in the early  morning hours.  She has been taking Naprosyn recently.  EGD is now being  done.  This approach has been discussed the patient at length.  Risks,  benefits and alternatives have been reviewed, questions answered.  It is  notable that repeat LFTs this morning also came back normal.   FINDINGS:  Oxygen saturation, blood pressure, pulse, and respirations  monitored throughout the entire procedure.  Conscious sedation with  Versed 7 mg IV and Demerol 150 mg IV in divided doses.  Cetacaine spray  for topical pharyngeal anesthesia.  Instrumentation was the Pentax video  chip system.  Findings of the tubular esophagus revealed no mucosal  abnormalities.  EG junction had a little  Cetacaine but that was easily  suctioned out.  Stomach insufflated very  well with air.  Thorough  examination of the gastric mucosa, including retroflex view of the  proximal stomach and esophagogastric junction was undertaken.  The  gastric mucosa was seen extremely well.  The patient was noted to have  two 4-5 mm prepyloric ulcers.  There were surrounding satellite  erosions.  This did not appear to be a benign process and then it did  not appear to have any infiltrating characteristics.  Otherwise gastric  mucosa appeared normal.  There was no hernia.  Pylorus was patent and  easily traversed.  Examination of the bulb and second portion revealed  somewhat pale-appearing, unusually smooth-appearing mucosa of uncertain  significance.  There was no ulcer infiltrating process.  This small  bowel was biopsied for histologic study.  Also the small ulcer in the  antral biopsied as well.  The patient tolerated the procedure well and  was reactive after endoscopy.   IMPRESSION:  1. Normal esophagus.  2. Prepyloric gastric ulceration with surrounding ulcer erosions      described above, status post biopsy.  3. Otherwise normal stomach, patent pylorus.  4. D1 and D2 revealed somewhat pale-appearing, smooth small bowel      mucosa of uncertain significance, biopsied separately.   Lack of findings on serial ultrasounds and HIDA in addition to today's  findings were somewhat out of proportion to the patient's symptoms.  She  has been given Toradol over the past 24 hours and says she gets short of  breath after each dose.   RECOMMENDATIONS:  1. Will increase Protonix to 40 grams orally b.i.d.  2. Carafate slurry 1 gram q.i.d.  3. Check H pylori serologies and follow up on biopsy.  4. Would avoid Toradol and all nonsteroidals for the time being.  5. We will, for the time-being, also resume low-dose Dilaudid for      symptomatic treatment.  6. Further recommendations to follow.      Bridgette Habermann, M.D.  Electronically Signed     RMR/MEDQ  D:  05/30/2007   T:  05/31/2007  Job:  [id number]   cc:   [facility]

## 2011-03-17 NOTE — H&P (Signed)
Jaime [name], Jaime [name]              ACCOUNT NO.:  [id number]   MEDICAL RECORD NO.:  [id number]          PATIENT TYPE:  INP   LOCATION:  A316                          FACILITY:  [facility]   PHYSICIAN:  Delphina Cahill, M.D.        DATE OF BIRTH:  11/27/1985   DATE OF ADMISSION:  05/27/2007  DATE OF DISCHARGE:  LH                              HISTORY & PHYSICAL   PRIMARY CARE PHYSICIAN:  Sherrilee Gilles. [name], MD.   CHIEF COMPLAINT:  Abdominal pain.   HISTORY OF PRESENT ILLNESS:  Jaime [name] is a 25 year old white female  with a past medical history significant for a bulging disk in her lower  back and an occasional nerve pain running down her legs to her knees,  also a history of migraine headaches who was in her usual state of  health up until about 2 weeks ago when she started noticing worsening  right abdominal pain initially not that severe but progressively  worsened over the next 2 weeks rating the pain at worst at 10/10 pain.  She  states that it is located in the right upper quadrant but with no  significant radiation.  Nothing makes it better or worse.  It is  somewhat colicky in nature. It does get better with only pain medication  and may be adequate for some time. She was seen by Dr. [name] apparently  2 days ago and had an ultrasound done. The results of that ultrasound  are not available at this time.  She denies having a CT scan done.  She  noted a low grade temperature several days ago and one episode of  vomiting a week ago but has been eating a bland soft diet since then and  is able to keep fluids down without problems.   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   MEDICATIONS:  1. Z-Pak with 2 days remaining on that.  2. Tylox 5/500.  3. Benadryl 50 mg p.r.n. for allergies and sleep.  4. Tylenol PM 2 tablets as needed.  5. Imitrex injection as needed.  6.  Epipen for bee stings, injection as-needed.  7. History of Vicodin 5/500 same as the Tylox.   ALLERGIES:  No known drug allergies.   SOCIAL HISTORY:  She works as a Quarry manager at [employer].  She does  not drink nor smoke nor do any other drugs.   FAMILY HISTORY:  The father's history is unknown.  Mother, she denies  having any health problems. She has two younger brothers with no health  problems.   REVIEW OF SYSTEMS:  The patient mentions mild headache at this time.  Occasional shooting pain down into her legs but denies any chest pain  and no shortness of breath, no problems with her vision.  No problems  with bowel or bladder, no other neurologic complaints.   OBJECTIVE:  VITAL SIGNS:  Temperature is 97.6, blood pressure 152/99,  pulse 110, respirations 18, satting 98% on room  air.  GENERAL:  This is an obese white female lying in bed in no acute  distress.  HEENT:  Pupils equal, round and reactive to light and accommodation.  No  scleral icterus.  No JVD. No thyromegaly.  LUNGS:  Clear to auscultation bilaterally.  HEART:  Mildly tachycardiac but regular rhythm.  No murmurs appreciated.  ABDOMEN:  Has right upper quadrant pain to deep palpation. Some mild  epigastric pain but no other pain mentioned in her abdomen.  No  significant rebound or guarding at this time.  Does have positive bowel  sounds.  No hepatosplenomegaly appreciated.  EXTREMITIES:  No lower extremity edema, 2+ pulses in all extremities.  NEUROLOGIC:  Cranial nerves II-XII intact.  No deficit appreciated.   LABORATORY DATA:  Has a UA which shows trace ketone but otherwise  negative.  Urine pregnancy was negative.  CBC shows white count 8.1,  hemoglobin 13, platelet count of 370. CMET shows sodium 137, potassium  3.9, chloride 105, CO2 24, glucose 87, BUN 3, creatinine 0.70, total  bili 0.6, alk phos 62, SGOT 16, SGPT 23, total protein 7.1, albumin 3.8,  calcium of 9.  Amylase is 17, lipase is 12. No other  scans obtained at  this time.   IMPRESSION:  This is a 25 year old white female with right upper  quadrant pain of unknown etiology rating approximately 10 out of 10 at  its worst, has been constant for the last 2 weeks.   ASSESSMENT/PLAN:  1. Right upper quadrant pain, differential includes cholelithiasis,      appendicitis, possible diverticulitis, ovarian cyst. Without      ultrasound results it is unclear exactly what that showed [place] if      anything. The patient denied that she has had a CT scan and so will      go ahead and order a CT scan at this time. Depending on what that      shows [place], I may want to get GI involved for any further      suggestions. I question whether having a HIDA scan would be needed      if nothing specific shown on CT scan or the findings on ultrasound.  2. Abdominal pain.  Will treat with pain medicines and continue on a      clear liquid diet. Will treat symptoms with Zofran if needed for      nausea and vomiting.   DISPOSITION:  The patient will be admitted to the third floor and will  follow up on all routine lab work and scans obtained.      Delphina Cahill, M.D.  Electronically Signed     ZH/MEDQ  D:  05/27/2007  T:  05/28/2007  Job:  [id number]

## 2011-03-17 NOTE — Group Therapy Note (Signed)
Jaime [name], Jaime [name]              ACCOUNT NO.:  [id number]   MEDICAL RECORD NO.:  [id number]          PATIENT TYPE:  INP   LOCATION:  A316                          FACILITY:  [facility]   PHYSICIAN:  Anselmo Pickler, DO    DATE OF BIRTH:  [birth year]   DATE OF PROCEDURE:  DATE OF DISCHARGE:                                 PROGRESS NOTE   Patient is a 25 year old Caucasian female who was admitted for acute  abdominal pain.  She is being followed by GI.  Currently, patient is  still complaining of abdominal pain.  Stated that pain was worse last  night.  She did not eat.  However, this morning she did eat her  breakfast without any problems.  Complains of pain as sharp in nature  and is located in mid epigastric.  Patient also complained that she did  not get good sleep last night.   VITAL SIGNS:  Temperature 97.7, pulse 61, respirations 20, blood  pressure 111/66.   PHYSICAL EXAMINATION:  GENERAL:  This is a 25 year old Caucasian female  who is obese, well-developed, well-nourished in no acute distress.  HEART:  Regular rate and rhythm.  No murmurs appreciated.  S1-S2  present.  LUNGS: Clear to auscultation bilaterally.  Chest is symmetrical with  movement.  No rales, wheezes or rhonchi.  ABDOMEN:  Tender midepigastric.  There is no rebound or rigidity noted.  Positive guarding.  Bowel sounds in all 4 quadrants.  EXTREMITIES:  Positive pulses in all extremities.  No cyanosis, edema or  clubbing.   LABS:  There were no labs done for today.  Nothing apparently has  changed.  Will consider ordering a set of labs for tomorrow to make sure  everything is okay.   ASSESSMENT AND PLAN:  1. Acute abdominal pain.  Await any gastrointestinal further      recommendations.  2. History of chronic back pain. Will continue to monitor patient and      assist with pain control.  3. Back pain.  Will recommend patient to get out of bed, move a little      bit more to hopefully help with increased pain  and her being      stationary.  4. Insomnia.  Will also order medication to help with sleep.  5. Discuss with patient at length about possible discharge tomorrow      and to be followed up at [facility] regarding any      residual abdominal pain after recommendations from GI.      Anselmo Pickler, DO  Electronically Signed     CB/MEDQ  D:  06/01/2007  T:  06/02/2007  Job:  [id number]

## 2011-03-17 NOTE — Consult Note (Signed)
Jaime [name], Jaime [name]              ACCOUNT NO.:  [id number]   MEDICAL RECORD NO.:  [id number]          PATIENT TYPE:  INP   LOCATION:  B262                          FACILITY:  [facility]   PHYSICIAN:  Caro Hight, M.D.      DATE OF BIRTH:  Dec 29, 1985   DATE OF CONSULTATION:  06/11/2007  DATE OF DISCHARGE:                                 CONSULTATION   DATE OF CONSULTATION:  June 11, 2007.   REFERRING PHYSICIAN:  Merry Lofty, MD.   REASON CONSULTATION:  Abdominal pain.   HISTORY OF PRESENT ILLNESS:  Jaime [name] is a 25 year old female who was  recently discharged after discovering antral erosions which were felt to  cause her epigastric pain.  Her complaint of pain is out of proportion  to the findings on endoscopy.  She complains that she came back to the  emergency department because her pain was not controlled.  She describes  it as pain in the right upper quadrant and in the epigastrium which is  stabbing.  It occasionally moves to the left side.  She denies any chest  pain or shortness of breath.  She had a dry cough yesterday.  She has  not been coughing up any blood.  Her grandfather states that she has  been having a lot of trouble with job stress as well as trying to  balance going back to school.  She was able to tolerate the pain but  woke up to go to the bathroom, and when it would not ease off.  She used  Carafate, and it would not ease off.  She states she has been eating  soups.  Food is not making her stomach hurt.  She denies vomiting.  She  has had dry heaves.  It was better with Phenergan.  She was able to  mow the yard.  She a one loose stool.  She has been consuming chocolate  cake.  She states Carafate works better than Maalox and lidocaine  combined.  When she came in, she described a 10/10 pain, but now it is  better.  She denies any use of anti-inflammatory drugs.  She does not  drink soda.  She does orange juice and Min[employer] [place] fruit punch.  She  also  drinks apple juice.  She states Vicodin did not help.  Dr. [name]  give her Dilaudid.   PAST MEDICAL HISTORY:  1. Migraine headaches.  2. Chronic back pain  3. Insomnia.   PAST SURGICAL HISTORY:  None.   ALLERGIES:  KETOROLAC causes shortness of breath.   FAMILY HISTORY:  Her grandfather had polyps, and the many family members  have had their gallbladders removed.   MEDICATIONS:  1. Tenormin  2. Flexeril  3. Lovenox.  4. Protonix b.i.d.  5. Carafate b.i.d.  6. Dilaudid 1-2 mg every 3 hours as needed.  7. Phenergan 12.5 mg every 6 hours as needed   SOCIAL HISTORY:  She works as a Psychologist, counselling.  She is single.  She  does not have any children.  She does not use any tobacco or  recreational  drugs or alcohol.   REVIEW OF SYSTEMS:  As per the HPI; otherwise, all systems are negative.   PHYSICAL EXAMINATION:  VITAL SIGNS:  Temperature 97.6, blood pressure  120/79, pulse 78, respiratory rate 20.  GENERAL:  She is in no apparent distress, alert and oriented x4.  HEENT:  Atraumatic, normocephalic.  Pupils equal, react to light.  Mouth:  No  oral lesions.  Posterior pharynx without erythema or exudate.  LUNGS:  Clear to auscultation bilaterally.  CARDIOVASCULAR:  Shows regular  rhythm, no murmur.  ABDOMEN:  Bowel sounds present, soft, obese,  nondistended. Mild tenderness to palpation in the right upper quadrant  and the epigastrium without rebound or guarding. EXTREMITIES:  Without  cyanosis, clubbing or edema.  NEUROLOGIC:  She has no focal neurologic  deficits.   LABORATORY DATA:  White count 7.9, hemoglobin 12.4, platelets 370. BUN  3, creatinine 0.67.  HCG negative.  Total bilirubin 0.6, alkaline  phosphatase 80, AST 21, ALT 40 (upper limit of normal 35). Stool heme  positive x1. Lipase 15, amylase.   ASSESSMENT:  Jaime [name] is a 25 year old female with abdominal pain  which is most likely secondary to peptic ulcer disease and gastritis.  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  The differential diagnoses include a low  likelihood of porphyria, vasculitis, or sphincter of Oddi dysfunction.  Her rectal bleeding occurred when she wiped her bottom with tissue.  Her  CT scan is negative for any acute intra-abdominal pathology.  Most  likely etiology for her rectal bleeding is hemorrhoids.  She has a low  likelihood of a colorectal polyp or cancer.   Thank you for allowing me to see Ms. Delpriore in consultation.  My  recommendations follow.   RECOMMENDATIONS:  1. She may have a colonoscopy as an outpatient with Dr. Gala Romney to      assess her rectal bleeding.  2. Will change her medicines to oral in anticipation of discharge on      June 12, 2007.  3. Continue antiemetics as needed.  4. Proton pump inhibitor b.i.d.  5. Okay to use Tylenol for additional pain relief.  6. Will repeat CBC and hepatic function panel in morning to evaluate      for transiently elevated liver enzymes consistent with sphincter of      Oddi dysfunction.  7. Maalox as needed for abdominal pain.  8. Change to soft diet.     Caro Hight, M.D.  Electronically Signed    SM/MEDQ  D:  06/12/2007  T:  06/13/2007  Job:  [id number]

## 2011-03-17 NOTE — Group Therapy Note (Signed)
NAMEJAMARA, VARY              ACCOUNT NO.:  [id number]   MEDICAL RECORD NO.:  [id number]          PATIENT TYPE:  INP   LOCATION:  U633                          FACILITY:  [facility]   PHYSICIAN:  Salem Caster, DO    DATE OF BIRTH:  [birth year]   DATE OF PROCEDURE:  06/14/2007  DATE OF DISCHARGE:  06/15/2007                                 PROGRESS NOTE   SUBJECTIVE:  The patient was sleeping on my exam. When we woke her up  the patient started complaining of the same right-sided abdominal pain  and epigastric pain. The patient seems to not be in any acute distress  and stable at this time.   OBJECTIVE:  VITAL SIGNS: Temperature is 97.8, pulse is 84, respirations  20, blood pressure 102/56.  CARDIAC: Regular rate and rhythm. No rubs, gallops or murmurs.  LUNGS: Her lungs were clear to auscultation bilaterally. No rales,  rhonchi or wheezing.  ABDOMEN: She had positive tenderness right upper quadrant and some  epigastric tenderness on palpation. No rigidity or guarding is  appreciated. She had positive bowel sounds.  EXTREMITIES: No clubbing, cyanosis or edema was noted.   LABORATORY DATA:  Sodium 136, potassium 4.4, chloride 102, CO2 is 28,  glucose 91, BUN 5, creatinine 0.73. White count was 12.5, hemoglobin  12.8 and hematocrit of 37.4, platelets 365.   ASSESSMENT AND PLAN:  For recurrent abdominal pain. The patient is  continued to be followed by gastroenterology. I spoke with Dr. Stann Mainland.  She spoke with physician at [facility]. The patient would get an  outpatient work up at [facility] upon discharge.   PLAN:  The plan is to discharge her 06/15/07, on a lactose-free, low-  residue diet.      Salem Caster, DO  Electronically Signed     SM/MEDQ  D:  06/14/2007  T:  06/15/2007  Job:  [id number]

## 2011-03-17 NOTE — Discharge Summary (Signed)
Jaime [name], Jaime [name]              ACCOUNT NO.:  [id number]   MEDICAL RECORD NO.:  [id number]          PATIENT TYPE:  OBV   LOCATION:  G401                          FACILITY:  [facility]   PHYSICIAN:  Anselmo Pickler, DO    DATE OF BIRTH:  December 25, 1985   DATE OF ADMISSION:  09/03/2007  DATE OF DISCHARGE:  11/04/2008LH                               DISCHARGE SUMMARY   ADMISSION DIAGNOSES:  1. Questionable small-bowel obstruction.  2. Chronic abdominal pain of unclear etiology.  3. History of migraine headaches.  4. Obesity.   DISCHARGE DIAGNOSIS:  Abdominal  pain.   CONSULTATIONS:  1. R. Garfield Cornea, M.D. office.  2. Chelsea Primus, MD.   The patient had a CT of the head on November 1 which was negative.   CT of the abdomen with contrast and pelvis with contrast showed some  diluted bowel, possible small-bowel obstruction could not be excluded.  Also, the pelvis was negative.   Acute abdominal series demonstrated no acute findings in the chest, no  evidence of bowel obstruction.   Her H&P was done by Dr. Bonnielee Haff.  Please refer to that.  To  summarize, the patient is a 25 year old Caucasian female who has history  of multiple admissions for abdominal pain here at [facility] as well as  [facility].  They have not been able to find an etiology on  this.  She has had testing done at [facility].  Due to the  findings of possible small-bowel obstruction, it was determined she  should at least be held in observation.  She was seen by Dr. Geroge Baseman who  did not think she needed surgical treatment at this time.  She was  admitted for pain control.  We continued to follow her. She began to  improve on a daily basis but still complained of chronic pain.  She was  unable to eat.  Had a clear liquid diet and tolerated that without any  problems.  She was able to have a bowel movement but still complained of  abdominal pain.  It was then determined on November 4, the  patient  continued to improve, she could be discharged.  I spoke with Dr. Hilma Favors  at [facility] regarding her current pain medication needs.  She is on  Dilaudid 4 mg every 4 hours as needed, Valium and Klonopin.  He stated  that she just filled this prescription less than 4 days ago, and she  should hae enough of Dilaudid.  She had 120 Dilaudid at 4 mg which was  just given to her prior to admission to the hospital.   CONDITION ON DISCHARGE:  The patient's condition is stable.   DISPOSITION:  To be sent to home.  GI talked extensively with her, and  they are going to help set up an appointment for her at [facility].  She is to follow with them, and she has been given explicit instructions  that, if she does not hear from them, she needs to call Dr. Roseanne Kaufman  office to see what is going on.  There is nothing  more that we can do  here for her regarding this pain.  She needs more extensive workup.  The  patient stated understanding.  She will not be sent home on any new  medications.  She will be sent home on her home medications which  include:  1. Flexeril 10 mg 3 times a day.  2. Carafate 1 gram 4 times a day.  3. Protonix 40 mg twice a day.  4. Klonopin 0.5 mg every night.  5. Valium 10 mg 3 times a day.  6. Dilaudid 4 mg every 4 hours as needed.      Anselmo Pickler, DO  Electronically Signed     CB/MEDQ  D:  09/06/2007  T:  09/06/2007  Job:  [id number]
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
## 2011-03-17 NOTE — Consult Note (Signed)
NAMEBHAVYA, ESCHETE              ACCOUNT NO.:  [id number]   MEDICAL RECORD NO.:  [id number]          PATIENT TYPE:  INP   LOCATION:  A076                          FACILITY:  [facility]   PHYSICIAN:  Merry Lofty, MD   DATE OF BIRTH:  [birth year]   DATE OF CONSULTATION:  06/12/2007  DATE OF DISCHARGE:                                 CONSULTATION   SUBJECTIVE:  The patient is a 25 year old female patient who was  admitted with abdominal pain.  She states that still she has pain and  nausea.   OBJECTIVE:  GENERAL:  The patient is stable.  VITAL SIGNS:  Stable.  Temperature 97, pulse 81, respiratory rate 20,  blood pressure 130/78, saturation 99.  HEENT:  Pink conjunctivae, anicteric sclerae.  NECK:  Supple.  CHEST:  Good air entry bilaterally.  CVS:  S1, S2 regular.  ABDOMEN:  Obese, soft.  No area of tenderness.  No rigidity or guarding.  EXTREMITIES:  No pedal edema.  CNS:  Alert and well oriented.   LABORATORIES:  CBC:  White blood cells 8.9, hemoglobin 13, hematocrit  38.5, platelet count 390.  Chemistry:  Sodium 140, potassium 4, chloride  104, bicarbonate 25, glucose 96, BUN 5, creatinine 0.7.  Pregnancy test  is negative.  Lipase and amylase within normal range.  Beta HCG is  negative.  Stool for occult blood is positive.   ASSESSMENT:  1. The patient is admitted for abdominal pain.  She was investigated      before by GI with EGD which showed peptic ulcer, and she is on PPI      and sucralfate, but still the patient is complaining of pain.  We      did not get the source of the pain until now.  GI is following her.      Dr. Stann Mainland is following her.  She is thinking that she might      transfer her to [facility] for a second opinion.  Otherwise,      the patient is stable.  Stool guaiac is positive.  I am not sure      whether it is really significant or not.  I will leave that one for      GI.  2. Peptic ulcer disease.  The patient is on Protonix and sucralfate  and Maalox, and she is under GI management.   PLAN:  The patient will remain in the same management with pain  management on Dilaudid.  It was decreased to 1 mg q.3 h.  She might be  transferred to [facility] according to [facility] decision in the coming  2 days.      Merry Lofty, MD  Electronically Signed     MT/MEDQ  D:  06/12/2007  T:  06/12/2007  Job:  [id number]
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
## 2011-03-17 NOTE — Discharge Summary (Signed)
NAMEELDENA, Jaime [name]              ACCOUNT NO.:  [id number]   MEDICAL RECORD NO.:  [id number]          PATIENT TYPE:  INP   LOCATION:  A326                          FACILITY:  [facility]   PHYSICIAN:  Bonnielee Haff, MD     DATE OF BIRTH:  05-25-1986   DATE OF ADMISSION:  05/08/2009  DATE OF DISCHARGE:  07/08/2010LH                               DISCHARGE SUMMARY   Please review H and P dictated by Dr. Marcello Moores for details regarding  the patient's presenting illness.   DISCHARGE DIAGNOSES:  1. Drug overdose, unintentional.  2. Polysubstance abuse.  3. Dehydration, improved.  4. Chest pain secondary to cardiopulmonary resuscitation.   BRIEF HOSPITAL COURSE:  Briefly, this is a 25 year old Caucasian female  who was actually brought in by EMS.  She was found to be unresponsive at  home.  Apparently, she was in rehab getting detox a few days ago and she  came home and relapsed.  She did crack cocaine and also used opiates and  benzodiazepines.  The patient was found to be unresponsive.  Apparently,  CPR was performed some time during the course of this event.  The  patient was observed in the hospital overnight.  She has done well.  The  only thing she was complaining of yesterday was chest pain and upon  evaluation this pain was found to be over the sternal area and the  sternum was extremely tender to palpation.  This was reproducing her  chest pain.  Because CPR was performed, we did a sternal x-ray which was  negative for fracture or any retrosternal hematoma.  There is no obvious  bruising on her chest.  No other deformities are noted.  The patient's  pain is being controlled with narcotics.  I have explained to her that  this will take a few days to a week to get better.   Regarding her drug abuse issue, she apparently has spoken to somebody in  [place] and wishes to go there to enter detox.  She can do this  after she is discharged from this hospital.   She was also found to  be very dehydrated and was started on IV fluids  and that is also better.  As a result of dehydration, her white count,  which was extremely elevated at 15,000, has come down and as a result of  dehydration her hemoglobin also appears to have been dropped but I think  this is mostly a dilution effect.  The platelet count is also decreased,  so this is mostly a result of dilution rather than bleeding.  LFTs are  normal.  She did have some right upper quadrant pain but she is status  post cholecystectomy.  The rest of the medical issues are all stable.  She has a history of bipolar disorder, depression.  She is on  psychotropic agents.   Today, she is still complaining of chest pain, requesting a higher dose  of narcotics.  Otherwise, she is doing well, no other complaints  offered.  Vital signs are all stable.  Blood pressure is 133/70,  saturation  98% on room air.  Lungs are clear to auscultation  bilaterally.  She does have tenderness to palpation over her sternum.  Cardiac exam unremarkable.  Abdomen is soft, no organomegaly is  appreciated.  Rest of the medical exam is unremarkable.   So, overall she is stable to be discharged.  She does not want to go  into rehab here, so we will let her go home and then decide what she  wants to do.  She is in no immediate medical danger.   DISCHARGE MEDICATIONS:  Percocet 5/325 every 4 hours as needed for  sternal pain, 30 tablets have been prescribed.  Otherwise, she may  continue her Cogentin unknown dose daily, Haldol 2 mg 3 times a day,  Lexapro 10 mg daily.   Tobacco abuse counseling was provided.   Diet as before.  Physical activity no restrictions.   Consultation from ACT team in the ED, otherwise none.  No procedures  were performed.   She did have a Foley catheter which was discontinued yesterday.   Total time on this encounter 35 minutes.      Bonnielee Haff, MD  Electronically Signed     GK/MEDQ  D:  05/09/2009  T:   05/09/2009  Job:  [id number]   cc:   Sherrilee Gilles. [name], MD  Fax: [phone]

## 2011-03-17 NOTE — Group Therapy Note (Signed)
Jaime [name], SCHULER              ACCOUNT NO.:  [id number]   MEDICAL RECORD NO.:  [id number]          PATIENT TYPE:  INP   LOCATION:  L465                          FACILITY:  [facility]   PHYSICIAN:  Salem Caster, DO    DATE OF BIRTH:  May 13, 1986   DATE OF PROCEDURE:  06/13/2007  DATE OF DISCHARGE:                                 PROGRESS NOTE   SUBJECTIVE:  The patient continues to complain of right-sided abdominal  pain with some associated epigastric pain.  The patient denies any bowel  movements today and states that she had a loose bowel movement  yesterday.  Apparently, she did have blood in her stool and noted  multiple times by staff.  The patient continues to be on Dilaudid and  states that the Dilaudid wears off in approximately 2 hours to 2-1/2  hours.   OBJECTIVE:  VITAL SIGNS:  Temperature 97.4, pulse 85, respirations 20,  blood pressure 125/72.  CARDIAC:  Regular rate and rhythm, no rubs, gallops or murmurs.  LUNGS:  Clear to auscultation bilaterally, no rales, rhonchi or  wheezing.  ABDOMEN:  Positive tenderness in right upper/right lower quadrant and  some epigastric tenderness on palpation.  Positive bowel sounds.  No  rigidity or guarding appreciated.  EXTREMITIES:  No clubbing, cyanosis or edema noted.   LABORATORY DATA AND X-RAY FINDINGS:  Sodium 138, potassium 3.9, chloride  106, CO2 25, glucose 95, BUN 9, creatinine 0.69.  White count 11.5,  hemoglobin 13, hematocrit 38.4, platelets 387.   ASSESSMENT/PLAN:  Abdominal pain.  The patient continues to be followed  by gastroenterology.  It was recommended that the patient may need a  colonoscopy prior to referral to [facility].  The patient will be continued on Dilaudid via intravenous as  well as proton pump inhibitor and sucralfate.  ACT team has been  consulted and will await their recommendations, also.  The patient will  be continued to be followed closely.      Salem Caster, DO  Electronically Signed     SM/MEDQ  D:  06/13/2007  T:  06/14/2007  Job:  [id number]

## 2011-03-17 NOTE — Discharge Summary (Signed)
Jaime [name], Jaime [name]              ACCOUNT NO.:  [id number]   MEDICAL RECORD NO.:  [id number]          PATIENT TYPE:  INP   LOCATION:  A316                          FACILITY:  [facility]   PHYSICIAN:  Anselmo Pickler, DO    DATE OF BIRTH:  02-11-1986   DATE OF ADMISSION:  05/27/2007  DATE OF DISCHARGE:  07/31/2008LH                               DISCHARGE SUMMARY   ADMISSION DIAGNOSES:  1. Right upper quadrant pain.  2. Abdominal pain.   DISCHARGE DIAGNOSES:  1. Gastric ulcers.  2. Abdominal pain.  3. Nausea and vomiting.   The patient's primary care physician is Dr. [name] of [facility].  The patient was admitted to the service of [facility] and  consults were Dr. Gala Romney of gastroenterology.   PROCEDURES:  The patient had an EGD done on May 30, 2007, that showed a  normal esophagitis with prepyloric gastric ulceration with surrounding  ulcer lesions, status post biopsy.  D1 and D2 revealed somewhat pale-  appearing smooth bowel mucosa of uncertain significance, and it was  biopsied separately.   HISTORY AND PHYSICAL:  The patient was admitted to the service of  [facility] after being examined in the ER room with a 2-week history of  abdominal pain that was progressively worsening.  She has been seen in  her primary care office and had had an ultrasound done.  At the time of  admission the results were not available; however, she did have a low-  grade fever and has had an episode of vomiting.  The patient has a known  history of chronic back pain as well.  She was then admitted to the  service of [facility] with the differential of cholelithiasis,  appendicitis, and possible diverticulitis.  At that point in time too,  GI was consulted and any other recommendations would follow following  her care.  She was also given antiemetics for abdominal pain that was  associated with nausea and vomiting.  Once GI saw her, it was determined  that they would do an upper endoscopy  to make sure that there were not  any pathologies in the stomach, and it was found that she did have some  gastric ulceration.  The patient was then started on a PPI b.i.d. and  pain control was continued with improvement in pain control for patient.  At this point in time it was determined that the patient could be  discharged  to home with the PPI and Carafate and then followed up by  her primary care physician on discharge.   DISCHARGE CONDITION:  Good and stable.   DISPOSITION:  She will be discharged to home.   MEDICATIONS:  She will be discharged on Protonix 40 mg one p.o. b.i.d.  She will also be discharged on Vicodin 5/500 mg one every 4-6 hours as  needed, #20; and Phenergan 12.5 mg p.o. every 6 hours for nausea.   Her instructions are to continue with a bland diet and to increase as  tolerated, and the patient herself had stated that she had scheduled an  appointment to see Dr. [name]  on Friday, August 1.  It is recommended  that she follow up with him to manage any chronic pain on top of that  and also regarding her chronic back pain.      Anselmo Pickler, DO  Electronically Signed     CB/MEDQ  D:  06/02/2007  T:  06/02/2007  Job:  [id number]   cc:   Sherrilee Gilles. [name], MD  Fax: [phone]

## 2011-03-17 NOTE — Consult Note (Signed)
Jaime [name], Jaime [name]              ACCOUNT NO.:  [id number]   MEDICAL RECORD NO.:  [id number]          PATIENT TYPE:  OBV   LOCATION:  A318                          FACILITY:  [facility]   PHYSICIAN:  R. Garfield Cornea, M.D. DATE OF BIRTH:  November 07, 1985   DATE OF CONSULTATION:  09/04/2007  DATE OF DISCHARGE:                                 CONSULTATION   REASON FOR CONSULTATION:  Abdominal pain.   REQUESTING PHYSICIAN:  [facility].   PRIMARY CARE PHYSICIAN:  Sherrilee Gilles. [name], M.D.   HISTORY OF PRESENT ILLNESS:  The patient is a 25 year old Caucasian  female with acute-on-chronic abdominal pain.  She has had an extensive  evaluation in the past.  She complains of constant right-sided abdominal  pain which became acutely more severe on Friday evening.  It kept her up  all night.  She saw Dr. Orson Ape Saturday morning.  Because of persistent  pain, she came on to the emergency department Saturday evening.  The  patient has a history of abdominal pain dating back at least to July of  this year.  She had an EGD on May 30, 2007 which revealed a prepyloric  gastric ulceration with surrounding ulcers and erosions.  Her biopsy was  negative and negative for H pylori as well.  She had a smooth, pale  small bowel with negative biopsy.  She also has had an unremarkable CT.  Her HIDA scan revealed an ejection fraction of 70%.  Her gallbladder was  unremarkable on ultrasound.  She had a flexible sigmoidoscopy on June 14, 2007 for rectal bleeding.  The patient refused full prep for  colonoscopy at the time.  She had formed stool in the right colon, rare  sigmoid diverticulosis, and possible anal fissure.  She noted that with  insufflation of the colon that she had reproduced pain.  Her colon  biopsies were negative as well.  Since that time, she went to [facility] for consultation.  She had an  endoscopic ultrasound by Dr. Pia Mau on July 19, 2007.   The  patient states that she had a thickened gallbladder wall.  She had her  gallbladder removed on July 22, 2007 at [facility].  The patient  states for the past month, for a month afterward, she had right-sided  abdominal pain which she related to her surgery.  Because the patient  persisted, she went back for evaluation.  She has since had a  colonoscopy at [facility] on August 24, 2007 because of bleeding, and she  states this was normal.  She had a second endoscopic ultrasound and  states she had an enlarged lymph node which was apparently also seen on  CT.  She states she had this either removed or biopsied, she is really  not sure.  She says she has no further followup at this time. She has  nausea but no vomiting.  She has not been able to eat.  She has frequent  loose stools, usually 3-4 a day.  She rarely has a day without having a  bowel movement.  She states she is having some black stools as well as  fresh blood in the stool.  She had some as recently as last week.  She  denies passing any hard stools.  She states her abdominal pain really is  not affected by meals.  It is not relieved with bowel movements.  She  also has pain which radiates into her low back at times.  She has  reported about a 40-pound weight loss since this all started.  Looking  back through our records on E-chart from May 27, 2007 through September 03, 2007, her weight has gone from 104 kg to 99 kg.   Upon this evaluation, she has had a CT, with preliminary report of  proximal small bowel loops which are mildly dilated in the distal small  bowel, suggestive of partial small-bowel obstruction, fatty liver.  She  was seen by Dr. Elby Showers who is on call for surgery, and there was not  felt to be any kind of surgical intervention needed at this time.  Labs  as below.   MEDICATIONS PRIOR TO ADMISSION:  1. Flexeril 10 mg t.i.d.  2. Carafate 1 g q.i.d.  3. Protonix 40 mg b.i.d.  4. Klonopin 0.5 mg q.h.s.   5. Valium 10 mg t.i.d.  6. Dilaudid 4 mg every 4 hours as needed.   ALLERGIES:  TORADOL.   PAST MEDICAL HISTORY:  1. Chronic abdominal pain, as outlined above.  2. Migraine headaches.  3. Chronic back pain.  4. Insomnia.  5. Status post cholecystectomy, EGD, colonoscopy, endoscopic      ultrasound, as outlined above.   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  She is single.  She does not have any children.  She does not use tobacco or  recreational drugs or alcohol use.   FAMILY HISTORY:  Negative for GI malignancies, although her maternal  grandfather had history of colonic polyps and melanoma.   REVIEW OF SYSTEMS:  See HPI for GI.  GU:  Denies dysuria or hematuria.  CONSTITUTIONAL:  See HPI.  CARDIOPULMONARY:  Denies chest pain or  shortness of breath.   PHYSICAL EXAMINATION:  VITAL SIGNS:  Temperature 97.4, pulse 99,  respirations 20, blood pressure 112/70, O2 saturation 94% on room air.  Height 70 inches.  Weight 99.79 kg.  GENERAL:  Pleasant, obese Caucasian female in no acute distress.  SKIN:  Warm and dry.  No jaundice.  HEENT:  Sclerae nonicteric.  Oropharyngeal mucosa moist and pink.  No  lesions, erythema, or exudates.  NECK:  No lymphadenopathy, thyromegaly.  CHEST:  Lungs are clear to auscultation.  CARDIAC:  Regular rate and rhythm.  Normal S1 and S2.  No murmurs, rubs,  or gallops.  ABDOMEN:  Positive bowel sounds.  Abdomen is obese but symmetrical.  She  has moderate tenderness throughout the right abdomen to deep palpation.  No rebound, tenderness, or guarding.  No abdominal bruits or hernias.  No hepatosplenomegaly or masses.  EXTREMITIES:  No edema.   LABORATORY DATA:  Her hemoglobin is 10.6, MCV 80.5, platelets 387,000,  white count 6900.  Hemoglobin on admission was 12.6.  Sodium 136,  potassium 4.1, BUN 4, creatinine 0.61, glucose 82.  Total bilirubin 0.5,   alkaline phosphatase 81, AST 18, ALT slightly elevated at 37, had been  53 on admission.  Albumin 2.9.  Looking back through her records, she  has had intermittent elevation of ALT no higher than the 50s and has  been normal on multiple occasions as well.  Lipase 13.  Urinalysis was  negative.   CT as outlined above.   IMPRESSION:  Jaime [name] is a 25 year old lady with acute-on-chronic  abdominal pain.  She is status post cholecystectomy for what she  describes as chronic cholecystitis on July 22, 2007.  She has had  persistent right midabdominal pain which recently intensified.  She also  describes having thickened gallbladder wall but no stones on endoscopic  ultrasound on July 19, 2007.  She has had repeat endoscopic  ultrasound a couple of weeks ago and states she had an abnormal lymph  node which was either removed or biopsied, but she is not aware of any  results.  She also has had a complete colonoscopy on August 24, 2007  for hematochezia which she reports to be negative.  The patient presents  this admission with acute-on-chronic right-sided abdominal pain with  nausea.  CT suggested partial small-bowel obstruction.  She has good  bowel sounds with no clinical evidence of obstruction.  She also has  transient mildly elevated alanine transaminase which may be due to fatty  liver versus sphincter of Oddi dysfunction which could help explain her  right-sided abdominal pain.  She complains of melena and bright red  blood per rectum.  She has a history of prepyloric/gastric ulcer on  esophagogastroduodenoscopy in July of 2008.  It is unclear whether this  has been seen on recent endoscopic ultrasounds.  She has had  unremarkable colonoscopies which failed to show a cause of her  persistent bleeding according to the patient.   We need to retrieve records from [facility].  Her  transient elevation of alanine transaminase may be due to fatty liver or   sphincter of Oddi dysfunction.  I suspect she will end up with  endoscopic retrograde cholangiopancreatography with manometry at some  point in the near future.  Her chronic right-sided abdominal pain may be  related to this or due to functional abdominal pain or other etiology  not yet determined.  I suspect, given her history of melena and  persistent hematochezia, that she will need to have her upper  gastrointestinal tract reexamined to exclude gastric ulcer if not seen  on recent endoscopic ultrasound or even possible small bowel capsule  endoscopy.   RECOMMENDATIONS:  1. Will follow up abdominal films which were done today.  2. Continue PPI and Carafate.  3. Recheck hemoglobin tomorrow.  4. Retrieve records from [facility].  5. Trial of clear liquid diet.  6. Further recommendations to follow.   I would like to thank [facility] for allowing [place] to take part in  the care of this patient.      Neil Crouch, P.ABridgette Habermann, M.D.  Electronically Signed    LL/MEDQ  D:  09/04/2007  T:  09/04/2007  Job:  [id number]   cc:   Sherrilee Gilles. [name], MD  Fax: [phone]   [facility]

## 2011-03-17 NOTE — Op Note (Signed)
NAMELEONARD, HENDLER              ACCOUNT NO.:  [id number]   MEDICAL RECORD NO.:  [id number]          PATIENT TYPE:  INP   LOCATION:  G818                          FACILITY:  [facility]   PHYSICIAN:  Caro Hight, M.D.      DATE OF BIRTH:  1986-03-05   DATE OF PROCEDURE:  06/14/2007  DATE OF DISCHARGE:                                PROCEDURE NOTE   REASON FOR PRESENTATION:  Dr. Salem Caster   PROCEDURE:  Limited colonoscopy with cold forceps biopsy.   INDICATION FOR EXAM:  Ms. Canny is a 25 year old female who has been  admitted in July 2008 for abdominal pain.  She was seen in consultation  by Dr. Milton Ferguson.  An EGD with biopsies revealed prepyloric gastric  ulcerations with surrounding ulcer erosions.  Biopsies of the stomach  showed no Helicobacter pylori and biopsies of the gastric mucosa showed  no Helicobacter pylori or active inflammation.  She had a CT scan of the  abdomen and pelvis with contrast which showed only mild diffuse fatty  liver disease.  She had a normal appendix, uterus and ovaries.  HIDA  scan on July 28 showed an ejection fraction of 70% at 50 minutes.  Her  cystic duct and common bile duct were patent.  She also had an  ultrasound which showed a distended gallbladder with septations.  She  had no stone disease or wall thickening.  Gallbladder wall was 1 mm.  Her common bile duct was 4.8 mm.  She was discharged to home with a  diagnosis of gastric ulcers, abdominal pain, and nausea and vomiting.  She was sent home with Vicodin one every 4-6 hours as needed, Phenergan  12.5 mg as needed every 6 hours, and Protonix 40 mg twice daily.  Seven  days later she returned to the emergency room complaining of abdominal  pain.  She has a significant past medical history of depression and  anxiety as well as migraines.  She was again admitted and CT scan of the  abdomen and pelvis with IV contrast showed fatty liver disease.  Her  liver, spleen and pancreas were normal.   The large- and small-bowel  loops were unremarkable.  She had a normal appendix, uterus, ovaries,  bladder and distal ureters.  Her white count was normal at 9.3,  hemoglobin 12.9, platelets 381.  AST 21, ALT 40 (upper limit of normal  is 35), albumin 3.5, amylase 12, lipase 15.  Urine hCG negative.  Urinalysis negative.  On hospital day #2, she started complaining of  rectal bleeding.  She was also hemoccult positive.  Her hemoglobin has  remained 12.4 to 13.1.  She had a C reactive protein checked which was  0.3 (greater than 0.6 is abnormal).  Her repeat liver panel on August 10  again showed a total bili of 0.4, alk phos 73, AST 15, ALT 29, albumin  3.4.  Repeat lipase was 13.  She continues to complain of persistent  abdominal pain and requiring narcotics, which are still not providing  relief.  Her limited colonoscopy was performed today to evaluate for  mucosal disease that may not have been seen on the CT scan. She declined  a full bowel prep.   FINDINGS:  1. Formed stool in the right colon which limited views of the right      colon.  Otherwise, rare sigmoid diverticulosis.  Possible anal      fissure.  During the exam, the patient stated the pain that was      induced by insufflation and push movement of the scope was the same      pain that has not been relieved since she was admitted.  2. Otherwise, no polyps, masses, inflammatory changes or AVMs seen.   DIAGNOSIS:  Ms. Dubreuil is a 25 year old female who complain of  persistent abdominal pain.  She has had an extensive negative workup.  Her 24-hour urine for porphyrinogens is pending.  The differential  diagnosis includes functional abdominal pain and a low likelihood of  eosinophilic gastroenteritis.  Her pain is out of proportion to her  exam.   RECOMMENDATIONS:  1. Advance to low-fat/low-residue diet.  2. Add Flora-Q daily.  3. Will discuss transfer to [facility] for      further  evaluation.  4. Would not escalate her pain medication regimen.   MEDICATIONS:  1. Demerol 100 mg IV  2. Versed 6 mg IV.   PROCEDURE TECHNIQUE:  Physical exam was performed and informed consent  was obtained from the patient after explaining the benefits, risks and  alternatives to the procedure.  The patient was connected to the monitor  and placed in the left lateral position.  Continuous oxygen was provided  by nasal cannula and IV medicine administered through an indwelling  cannula.  After administration of sedation and rectal exam, the  patient's rectum was intubated and the scope was advanced under direct  visualization to the distal ascending colon.  The scope was removed  slowly by carefully examining the color, texture, anatomy and integrity  of the mucosa on the way out.  The patient was recovered in endoscopy  and discharged to the floor in satisfactory condition.      Caro Hight, M.D.  Electronically Signed     SM/MEDQ  D:  06/14/2007  T:  06/14/2007  Job:  [id number]   cc:   Sherrilee Gilles. [name], MD  Fax: [phone]

## 2011-03-17 NOTE — Consult Note (Signed)
Jaime [name], Jaime [name]              ACCOUNT NO.:  [id number]   MEDICAL RECORD NO.:  [id number]          PATIENT TYPE:  INP   LOCATION:  A316                          FACILITY:  [facility]   PHYSICIAN:  R. Garfield Cornea, M.D. DATE OF BIRTH:  10/12/1986   DATE OF CONSULTATION:  05/28/2007  DATE OF DISCHARGE:                                 CONSULTATION   REASON FOR CONSULTATION:  Right upper quadrant abdominal pain.   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 Admitted to the  hospital yesterday with a 2-week history of epigastric right upper  quadrant abdominal pain. The patient tells me her symptoms started  insidiously when she was sitting on her grandmother's reclining chair  watching TV. Her symptoms have waxed and waned but have been persistent  since they started some 2 weeks ago. She describes a boring, epigastric,  right upper quadrant abdominal pain. There is no radicular component.  She has had some loose stools recently but has not had a great deal of  diarrhea. She denies melena or gross blood per rectum. It is interesting  to note that the pain tends to really get worse around 4 o'clock in the  morning and often awakes her from sleep over the past 2 weeks. She notes  that eating tends to ease the pain, albeit transiently.   It is also notable that she has been taking Naprosyn for her chronic  back pain regularly but not necessarily every day. She was told by Dr.  [name] to stop this medication 5 days ago. She complied. She denies  taking any other nonsteroidal agents. She had similar transient symptoms  back in February for a couple of days. She went to the emergency  department but things pretty much resolved. She denies odynophagia,  dysphagia, early satiety, reflux symptoms. She has had some recent  nausea and a couple of episodes of nonbloody emesis. She denies yellow  jaundice, clay-colored  stools or dark-colored urine. She has not really  had any fever or chills. She tells me she had an ultrasound at [facility] 2 days ago. The results are unknown to her or the medical  staff here as were are not able to obtain any information from that  center after hours. She did have a CT of the abdomen and pelvis here  yesterday which demonstrated a fatty appearing liver, no obvious  gallbladder abnormalities, no evidence of appendicitis or other acute  process which would explain her right-sided abdominal pain. An  ultrasound and HIDA have been ordered and are pending at this time.   FAMILY HISTORY:  There is no family history of GI malignancy, although  her maternal grandfather has a history of colonic polyps and melanoma.   PAST FAMILY PSYCHIATRIC HISTORY:  1. Migraine headaches.  2. Insomnia.  3. Anaphylaxis to bee stings.  4. Discogenic back pain. She has radiculopathy with T11, T12 nerve      impingement on the right side seen on an MRI June of this year.  MEDICATIONS ON ADMISSION:  1. Finishing up Z-Pak; 2 days remaining.  2. Tylox 5/500.  3. Benadryl 50 mg p.r.n. allergies or sleep.  4. Sometimes she takes Benadryl in the form of Tylenol p.m.  5. Imitrex p.r.n..  6. Epipen p.r.n., bee stings.   ALLERGIES:  NO KNOWN DRUG ALLERGIES.   FAMILY HISTORY:  As outlined above. Grandfather had colonic polyps;  otherwise no history of chronic GI or liver illness.   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 She is  single. She does not have any children. She does not use tobacco,  illicit drugs or alcohol.   REVIEW OF SYSTEMS:  As in history of present illness, has not had any  fever, chills or change in weight lately.   PHYSICAL EXAMINATION:  GENERAL APPEARANCE: Reveals a pleasant, obese, 36-  year-old  resting comfortably, in no acute distress. She is alert and  conversant.  VITAL SIGNS: Temp 97.5, BP 117/66, pulse 74, respiratory rate 20.   Admission weight is documented at 104.6 kg. She is 70 inches tall.  CHEST: She has a tattoo on her upper left chest.  HEENT: There is no jaundice on exam, no scleral icterus. Oral cavity, no  lesions.  LUNGS: The lungs are clear to auscultation.  CARDIAC:    Exam has a regular rate and rhythm without murmur, gallop or  rub.  BREASTS: Exam is deferred.  ABDOMEN: The abdomen is obese, positive bowel sounds, soft. She has  localized epigastric medial and right upper quadrant tenderness to deep  palpation. No appreciable mass or organomegaly, rebound or guarding.  There is no obvious mass or hepatosplenomegaly.  EXTREMITIES: The extremities have no edema.   ADMISSION LABORATORY DATA:  Sodium 138, potassium 4.1, chloride 107, CO2  25, glucose 88, BUN 3, creatinine 0.62, calcium 8.6. White count 9.9,  hemoglobin and hematocrit 11.9 and 34.7, MCV 84.5, platelet count  328,000. Lipase normal at 12, amylase normal at 17.   Urine pregnancy negative. Urinalysis negative.   AST, ALT normal at 16 and 23, respectively. Total bilirubin 0.6.   IMPRESSION:  The patient is a very pleasant, 25 year old, Caucasian  female, CNA, admitted to the hospital with a 2-week history of  epigastric right upper quadrant abdominal pain. I am struck by the  somewhat unrelenting nature of her abdominal pain in the fact that it  crescendos often in the early morning hours, awakening her from her  sound sleep. It is also noteworthy that eating a meal tends to  ameliorate her symptoms albeit temporarily. She has been taking Naprosyn  regularly up until 5 days ago.   Her constellation of symptoms would be atypical at this point for  gallbladder disease an much more suspicious for occult peptic ulcer  disease. Certainly, CT did not reveal any other process such as  appendicitis or nephrolithiasis to account for her symptoms. Of course,  an ultrasound of the gallbladder beats out an abdominal CT in terms of   sensitivity and specificity for gallbladder disease. She has had a  gallbladder ultrasound recently but those results are unknown to [place] at  this time.   Peptic ulcer disease needs to be ruled out.   RECOMMENDATIONS:  I do agree with further work up of her gallbladder.  Ultrasound and HIDA have been ordered. However, regardless of the  results of those studies, I feel that this lady needs to have a  diagnostic EGD to further evaluation her for peptic ulcer disease.  The  potential risks, benefits and alternatives have been reviewed with this  nice lady and questions answered.   PLAN:  To perform an EGD on 05/30/07. She tells me that she currently is  on Dilaudid 1 mg every 4 h for pain and this is not holding her during  the third and fourth hour after dosing. Will take the liberty of  changing that regimen to 1 mg IV q. 2 h and will add acid suppression  therapy empirically in the way of IV Protonix.   I would like to thank Dr. Bonnielee Haff for allowing me to see this  very nice lady this afternoon. I will be following her along with him  while she is hospitalized.      Bridgette Habermann, M.D.  Electronically Signed     RMR/MEDQ  D:  05/28/2007  T:  05/28/2007  Job:  [id number]   cc:   Sherrilee Gilles. [name], MD  Fax: [phone]   Bonnielee Haff, MD

## 2011-07-30 LAB — DIFFERENTIAL
Eosinophils Absolute: 0.1
Eosinophils Relative: 2
Lymphs Abs: 1.4
Monocytes Absolute: 0.6
Monocytes Relative: 8

## 2011-07-30 LAB — BASIC METABOLIC PANEL
BUN: 5 — ABNORMAL LOW
Chloride: 105
GFR calc Af Amer: >60
Potassium: 3.7

## 2011-07-30 LAB — CBC
HCT: 35.1 — ABNORMAL LOW
Hemoglobin: 12.3
MCV: 81.6
RBC: 4.3
WBC: 8.1

## 2011-08-04 LAB — STREP A DNA PROBE

## 2011-08-11 LAB — COMPREHENSIVE METABOLIC PANEL
ALT: 37 — ABNORMAL HIGH
ALT: 53 — ABNORMAL HIGH
AST: 18
AST: 26
Albumin: 2.9 — ABNORMAL LOW
Albumin: 3.6
Alkaline Phosphatase: 100
Alkaline Phosphatase: 81
BUN: 4 — ABNORMAL LOW
BUN: 6
CO2: 25
CO2: 25
Calcium: 8.7
Calcium: 9.6
Chloride: 105
Chloride: 105
Creatinine, Ser: 0.61
Creatinine, Ser: 0.67
GFR calc Af Amer: >60
GFR calc Af Amer: >60
GFR calc non Af Amer: >60
GFR calc non Af Amer: >60
Glucose, Bld: 101 — ABNORMAL HIGH
Glucose, Bld: 82
Potassium: 4
Potassium: 4.1
Sodium: 136
Sodium: 136
Total Bilirubin: 0.5
Total Bilirubin: 0.5
Total Protein: 6
Total Protein: 7.6

## 2011-08-11 LAB — URINALYSIS, ROUTINE W REFLEX MICROSCOPIC
Bilirubin Urine: NEGATIVE
Glucose, UA: NEGATIVE
Hgb urine dipstick: NEGATIVE
Ketones, ur: NEGATIVE
Nitrite: NEGATIVE
Protein, ur: NEGATIVE
Specific Gravity, Urine: 1.015
Urobilinogen, UA: 0.2
pH: 8

## 2011-08-11 LAB — HEPATIC FUNCTION PANEL
ALT: 31
AST: 19
Albumin: 2.9 — ABNORMAL LOW
Alkaline Phosphatase: 78
Bilirubin, Direct: 0.1
Indirect Bilirubin: 0.2 — ABNORMAL LOW
Total Bilirubin: 0.3
Total Protein: 6.1

## 2011-08-11 LAB — CBC
HCT: 30.7 — ABNORMAL LOW
HCT: 37.3
Hemoglobin: 10.6 — ABNORMAL LOW
Hemoglobin: 12.6
MCHC: 33.8
MCHC: 34.5
MCV: 80.5
MCV: 80.7
Platelets: 387
Platelets: 432 — ABNORMAL HIGH
RBC: 3.81 — ABNORMAL LOW
RBC: 4.62
RDW: 13.4
RDW: 13.7
WBC: 6.9
WBC: 8.4

## 2011-08-11 LAB — DIFFERENTIAL
Basophils Absolute: 0
Basophils Absolute: 0
Basophils Relative: 0
Basophils Relative: 1
Eosinophils Absolute: 0
Eosinophils Absolute: 0.1
Eosinophils Relative: 0
Eosinophils Relative: 2
Lymphocytes Relative: 15
Lymphocytes Relative: 36
Lymphs Abs: 1.2
Lymphs Abs: 2.4
Monocytes Absolute: 0.4
Monocytes Absolute: 0.5
Monocytes Relative: 5
Monocytes Relative: 7
Neutro Abs: 3.7
Neutro Abs: 6.7
Neutrophils Relative %: 55
Neutrophils Relative %: 80 — ABNORMAL HIGH

## 2011-08-11 LAB — HEMOGLOBIN AND HEMATOCRIT, BLOOD
HCT: 31.1 — ABNORMAL LOW
Hemoglobin: 10.7 — ABNORMAL LOW

## 2011-08-11 LAB — LIPASE, BLOOD: Lipase: 13

## 2011-08-11 LAB — OCCULT BLOOD X 1 CARD TO LAB, STOOL
Fecal Occult Bld: NEGATIVE
Fecal Occult Bld: NEGATIVE

## 2011-08-11 LAB — PREGNANCY, URINE: Preg Test, Ur: NEGATIVE

## 2011-08-12 LAB — DIFFERENTIAL
Basophils Absolute: 0
Basophils Absolute: 0
Basophils Relative: 0
Basophils Relative: 1
Eosinophils Absolute: 0.1
Eosinophils Absolute: 0.1
Eosinophils Relative: 1
Eosinophils Relative: 2
Lymphocytes Relative: 22
Lymphocytes Relative: 25
Lymphs Abs: 1.4
Lymphs Abs: 1.6
Monocytes Absolute: 0.4
Monocytes Absolute: 0.7
Monocytes Relative: 10
Monocytes Relative: 6
Neutro Abs: 4.3
Neutro Abs: 4.3
Neutrophils Relative %: 66
Neutrophils Relative %: 66

## 2011-08-12 LAB — CBC
HCT: 39.5
HCT: 41.6
Hemoglobin: 13.4
Hemoglobin: 14.1
MCHC: 33.8
MCHC: 34
MCV: 80.8
MCV: 81.2
Platelets: 361
Platelets: 468 — ABNORMAL HIGH
RBC: 4.89
RBC: 5.12 — ABNORMAL HIGH
RDW: 13.1
RDW: 13.3
WBC: 6.4
WBC: 6.6

## 2011-08-12 LAB — COMPREHENSIVE METABOLIC PANEL
ALT: 48 — ABNORMAL HIGH
AST: 25
Albumin: 4.3
Alkaline Phosphatase: 89
BUN: 3 — ABNORMAL LOW
CO2: 25
Calcium: 9.8
Chloride: 107
Creatinine, Ser: 0.79
GFR calc Af Amer: >60
GFR calc non Af Amer: >60
Glucose, Bld: 100 — ABNORMAL HIGH
Potassium: 4.1
Sodium: 140
Total Bilirubin: 0.5
Total Protein: 8.9 — ABNORMAL HIGH

## 2011-08-12 LAB — URINALYSIS, ROUTINE W REFLEX MICROSCOPIC
Bilirubin Urine: NEGATIVE
Bilirubin Urine: NEGATIVE
Glucose, UA: NEGATIVE
Glucose, UA: NEGATIVE
Hgb urine dipstick: NEGATIVE
Hgb urine dipstick: NEGATIVE
Ketones, ur: NEGATIVE
Ketones, ur: NEGATIVE
Nitrite: NEGATIVE
Nitrite: NEGATIVE
Protein, ur: NEGATIVE
Protein, ur: NEGATIVE
Specific Gravity, Urine: 1.015
Specific Gravity, Urine: >1.03 — ABNORMAL HIGH
Urobilinogen, UA: 0.2
Urobilinogen, UA: 0.2
pH: 6
pH: 8

## 2011-08-12 LAB — PREGNANCY, URINE
Preg Test, Ur: NEGATIVE
Preg Test, Ur: NEGATIVE

## 2011-08-12 LAB — HEMOGLOBIN AND HEMATOCRIT, BLOOD
HCT: 41.5
Hemoglobin: 14

## 2011-08-12 LAB — LIPASE, BLOOD: Lipase: 16

## 2011-08-12 LAB — AMYLASE: Amylase: 15 — ABNORMAL LOW

## 2011-08-14 LAB — URINALYSIS, ROUTINE W REFLEX MICROSCOPIC
Bilirubin Urine: NEGATIVE
Glucose, UA: NEGATIVE
Hgb urine dipstick: NEGATIVE
Ketones, ur: NEGATIVE
Nitrite: NEGATIVE
Protein, ur: NEGATIVE
Specific Gravity, Urine: 1.015
Urobilinogen, UA: 0.2
pH: 7.5

## 2011-08-14 LAB — COMPREHENSIVE METABOLIC PANEL
ALT: 32
AST: 20
Albumin: 3.8
Alkaline Phosphatase: 78
BUN: 5 — ABNORMAL LOW
CO2: 25
Calcium: 9.5
Chloride: 103
Creatinine, Ser: 0.73
GFR calc Af Amer: >60
GFR calc non Af Amer: >60
Glucose, Bld: 105 — ABNORMAL HIGH
Potassium: 4.4
Sodium: 137
Total Bilirubin: 0.6
Total Protein: 7.8

## 2011-08-14 LAB — LIPASE, BLOOD: Lipase: 14

## 2011-08-17 LAB — OCCULT BLOOD X 1 CARD TO LAB, STOOL
Fecal Occult Bld: POSITIVE
Fecal Occult Bld: NEGATIVE
Fecal Occult Bld: NEGATIVE
Fecal Occult Bld: POSITIVE

## 2011-08-17 LAB — PORPHYRINS, FRACTIONATED URINE (TIMED COLLECTION)
Coproporphyrin, U: 12 (ref 0–22)
Creatinine, Urine mg/day-PORPF: 1848 mg/d — ABNORMAL HIGH (ref 700–1600)
Creatinine, Urine-mg/dL-PORPF: 48 mg/dL
Heptacarboxyl Porphyrin, 24H Ur: 0 (ref 0–2)
Porphyrins Interpretation: NORMAL
Time-PORPF: 24 hr
Uroporphyrin, Urine: 1 (ref 0–4)
Volume, Urine-PORPF: 3850

## 2011-08-17 LAB — CBC
HCT: 35.6 — ABNORMAL LOW
HCT: 36.3
HCT: 37.4
HCT: 37.7
HCT: 38.4
HCT: 38.5
HCT: 34.7 — ABNORMAL LOW
HCT: 35.4 — ABNORMAL LOW
HCT: 38.2
Hemoglobin: 12.1
Hemoglobin: 12.4
Hemoglobin: 12.8
Hemoglobin: 12.9
Hemoglobin: 13
Hemoglobin: 13.1
Hemoglobin: 11.9 — ABNORMAL LOW
Hemoglobin: 12.1
Hemoglobin: 13
MCHC: 33.9
MCHC: 34
MCHC: 34
MCHC: 34.1
MCHC: 34.1
MCHC: 34.1
MCHC: 34
MCHC: 34.2
MCHC: 34.2
MCV: 83.3
MCV: 83.5
MCV: 83.8
MCV: 83.8
MCV: 83.9
MCV: 84.1
MCV: 83.6
MCV: 84.5
MCV: 84.6
Platelets: 355
Platelets: 355
Platelets: 370
Platelets: 381
Platelets: 387
Platelets: 390
Platelets: 326
Platelets: 344
Platelets: 370
RBC: 4.25
RBC: 4.35
RBC: 4.47
RBC: 4.53
RBC: 4.57
RBC: 4.57
RBC: 4.11
RBC: 4.19
RBC: 4.56
RDW: 12.3
RDW: 12.4
RDW: 12.4
RDW: 12.6
RDW: 12.8
RDW: 12.9
RDW: 12.6
RDW: 12.7
RDW: 12.8
WBC: 10.3
WBC: 11.5 — ABNORMAL HIGH
WBC: 12.5 — ABNORMAL HIGH
WBC: 7.9
WBC: 8.9
WBC: 9.3
WBC: 8.1
WBC: 8.2
WBC: 9.9

## 2011-08-17 LAB — DIFFERENTIAL
Basophils Absolute: 0
Basophils Absolute: 0.1
Basophils Absolute: 0.1
Basophils Absolute: 0.1
Basophils Absolute: 0.1
Basophils Absolute: 0.1
Basophils Absolute: 0.1
Basophils Absolute: 0.1
Basophils Absolute: 0.1
Basophils Relative: 1
Basophils Relative: 1
Basophils Relative: 1
Basophils Relative: 1
Basophils Relative: 1
Basophils Relative: 1
Basophils Relative: 1
Basophils Relative: 1
Basophils Relative: 1
Eosinophils Absolute: 0.2
Eosinophils Absolute: 0.3
Eosinophils Absolute: 0.3
Eosinophils Absolute: 0.4
Eosinophils Absolute: 0.4
Eosinophils Absolute: 0.4
Eosinophils Absolute: 0.1
Eosinophils Absolute: 0.2
Eosinophils Absolute: 0.3
Eosinophils Relative: 3
Eosinophils Relative: 3
Eosinophils Relative: 3
Eosinophils Relative: 3
Eosinophils Relative: 4
Eosinophils Relative: 5
Eosinophils Relative: 2
Eosinophils Relative: 3
Eosinophils Relative: 3
Lymphocytes Relative: 19
Lymphocytes Relative: 22
Lymphocytes Relative: 26
Lymphocytes Relative: 30
Lymphocytes Relative: 31
Lymphocytes Relative: 31
Lymphocytes Relative: 31
Lymphocytes Relative: 31
Lymphocytes Relative: 33
Lymphs Abs: 2.2
Lymphs Abs: 2.4
Lymphs Abs: 2.5
Lymphs Abs: 2.8
Lymphs Abs: 2.8
Lymphs Abs: 3
Lymphs Abs: 2.5
Lymphs Abs: 2.7
Lymphs Abs: 3
Monocytes Absolute: 0.5
Monocytes Absolute: 0.5
Monocytes Absolute: 0.6
Monocytes Absolute: 0.6
Monocytes Absolute: 0.8 — ABNORMAL HIGH
Monocytes Absolute: 0.8 — ABNORMAL HIGH
Monocytes Absolute: 0.4
Monocytes Absolute: 0.5
Monocytes Absolute: 0.6
Monocytes Relative: 5
Monocytes Relative: 6
Monocytes Relative: 6
Monocytes Relative: 7
Monocytes Relative: 7
Monocytes Relative: 8
Monocytes Relative: 5
Monocytes Relative: 6
Monocytes Relative: 6
Neutro Abs: 4.7
Neutro Abs: 5.1
Neutro Abs: 5.6
Neutro Abs: 6.8
Neutro Abs: 7.4
Neutro Abs: 8.9 — ABNORMAL HIGH
Neutro Abs: 4.9
Neutro Abs: 4.9
Neutro Abs: 5.8
Neutrophils Relative %: 58
Neutrophils Relative %: 59
Neutrophils Relative %: 60
Neutrophils Relative %: 65
Neutrophils Relative %: 66
Neutrophils Relative %: 71
Neutrophils Relative %: 59
Neutrophils Relative %: 59
Neutrophils Relative %: 61

## 2011-08-17 LAB — BASIC METABOLIC PANEL
BUN: 3 — ABNORMAL LOW
BUN: 5 — ABNORMAL LOW
BUN: 5 — ABNORMAL LOW
BUN: 6
BUN: 9
BUN: 3 — ABNORMAL LOW
BUN: 3 — ABNORMAL LOW
CO2: 25
CO2: 25
CO2: 25
CO2: 28
CO2: 28
CO2: 24
CO2: 25
Calcium: 8.7
Calcium: 8.8
Calcium: 8.9
Calcium: 9.2
Calcium: 9.5
Calcium: 8.6
Calcium: 9.3
Chloride: 102
Chloride: 104
Chloride: 105
Chloride: 106
Chloride: 106
Chloride: 107
Chloride: 109
Creatinine, Ser: 0.62
Creatinine, Ser: 0.67
Creatinine, Ser: 0.69
Creatinine, Ser: 0.72
Creatinine, Ser: 0.73
Creatinine, Ser: 0.62
Creatinine, Ser: 0.69
GFR calc Af Amer: >60
GFR calc Af Amer: >60
GFR calc Af Amer: >60
GFR calc Af Amer: >60
GFR calc Af Amer: >60
GFR calc Af Amer: >60
GFR calc Af Amer: >60
GFR calc non Af Amer: >60
GFR calc non Af Amer: >60
GFR calc non Af Amer: >60
GFR calc non Af Amer: >60
GFR calc non Af Amer: >60
GFR calc non Af Amer: >60
GFR calc non Af Amer: >60
Glucose, Bld: 85
Glucose, Bld: 91
Glucose, Bld: 95
Glucose, Bld: 96
Glucose, Bld: 97
Glucose, Bld: 86
Glucose, Bld: 88
Potassium: 3.9
Potassium: 4
Potassium: 4.1
Potassium: 4.1
Potassium: 4.4
Potassium: 4.1
Potassium: 4.1
Sodium: 136
Sodium: 136
Sodium: 138
Sodium: 139
Sodium: 140
Sodium: 138
Sodium: 141

## 2011-08-17 LAB — COMPREHENSIVE METABOLIC PANEL
ALT: 40 — ABNORMAL HIGH
ALT: 23
AST: 21
AST: 16
Albumin: 3.5
Albumin: 3.8
Alkaline Phosphatase: 80
Alkaline Phosphatase: 62
BUN: 4 — ABNORMAL LOW
BUN: 3 — ABNORMAL LOW
CO2: 25
CO2: 24
Calcium: 8.7
Calcium: 9
Chloride: 106
Chloride: 105
Creatinine, Ser: 0.55
Creatinine, Ser: 0.7
GFR calc Af Amer: >60
GFR calc Af Amer: >60
GFR calc non Af Amer: >60
GFR calc non Af Amer: >60
Glucose, Bld: 102 — ABNORMAL HIGH
Glucose, Bld: 87
Potassium: 3.5
Potassium: 3.9
Sodium: 137
Sodium: 137
Total Bilirubin: 0.6
Total Bilirubin: 0.6
Total Protein: 6.9
Total Protein: 7.1

## 2011-08-17 LAB — URINALYSIS, ROUTINE W REFLEX MICROSCOPIC
Bilirubin Urine: NEGATIVE
Bilirubin Urine: NEGATIVE
Glucose, UA: NEGATIVE
Glucose, UA: NEGATIVE
Hgb urine dipstick: NEGATIVE
Hgb urine dipstick: NEGATIVE
Ketones, ur: NEGATIVE
Nitrite: NEGATIVE
Nitrite: NEGATIVE
Protein, ur: NEGATIVE
Protein, ur: NEGATIVE
Specific Gravity, Urine: 1.01
Specific Gravity, Urine: 1.02
Urobilinogen, UA: 0.2
Urobilinogen, UA: 0.2
pH: 6.5
pH: 6

## 2011-08-17 LAB — HEPATIC FUNCTION PANEL
ALT: 29
ALT: 19
ALT: 25
AST: 15
AST: 14
AST: 17
Albumin: 3.4 — ABNORMAL LOW
Albumin: 3.4 — ABNORMAL LOW
Albumin: 3.5
Alkaline Phosphatase: 73
Alkaline Phosphatase: 60
Alkaline Phosphatase: 61
Bilirubin, Direct: <0.1
Bilirubin, Direct: 0.1
Bilirubin, Direct: 0.1
Indirect Bilirubin: 0.5
Indirect Bilirubin: 0.6
Total Bilirubin: 0.4
Total Bilirubin: 0.6
Total Bilirubin: 0.7
Total Protein: 6.3
Total Protein: 6.1
Total Protein: 7.1

## 2011-08-17 LAB — H. PYLORI ANTIBODY, IGG: H Pylori IgG: 0.6

## 2011-08-17 LAB — C-REACTIVE PROTEIN: CRP: 0.3 — ABNORMAL LOW (ref <0.6)

## 2011-08-17 LAB — PREGNANCY, URINE
Preg Test, Ur: NEGATIVE
Preg Test, Ur: NEGATIVE

## 2011-08-17 LAB — LIPASE, BLOOD
Lipase: 13
Lipase: 15
Lipase: 12

## 2011-08-17 LAB — AMYLASE
Amylase: 12 — ABNORMAL LOW
Amylase: 17 — ABNORMAL LOW

## 2011-08-17 LAB — URINE MICROSCOPIC-ADD ON

## 2011-12-10 ENCOUNTER — Ambulatory Visit (INDEPENDENT_AMBULATORY_CARE_PROVIDER_SITE_OTHER): Payer: Self-pay | Admitting: Gastroenterology

## 2011-12-10 DIAGNOSIS — B182 Chronic viral hepatitis C: Secondary | ICD-10-CM

## 2011-12-17 NOTE — Progress Notes (Signed)
NAMEMarland Kitchen  Jaime [name], Jaime [name]  MR#:  [id number]      DATE:  12/10/2011  DOB:  July 14, 1986    cc: Jeneen Rinks, MD, [facility]) [address] [place], [place]  [address], fax number [phone]    REASON FOR VISIT:  Follow up of genotype 3A hepatitis C.   History:  The patient returns today unaccompanied. She was last seen on 12/26/2009, which was her initial visit. I obtained her genotype at the time which was 3A. I gave her name over to the research  coordinator at that time to see if she would fit into a protocol. She was then lost to followup. Currently the patient has no symptoms referable to her history of hepatitis C. There are no symptoms to  suggest cryoglobulin mediated or decompensated liver disease. She remains interested in treatment.   PAST MEDICAL HISTORY:  Significant for no interval change, and denies any diabetes, dyslipidemia, coronary disease, hypertension, or dysthyroidism.    MEDICATIONS:  Multivitamin once daily, stool softener twice daily, methadone 130 mg daily, Dramamine p.r.n. She uses Depo-Provera for contraception.   ALLERGIES:  Toradol causes shortness of breath.   HABITS:  Smoking, 2 packs cigarettes per day. Alcohol, denies interval consumption.  The patient continues to be seen at [facility] for methadone treatment. She tried to taper this off in late 2011 but had to resume it by 01/2011 because of symptoms of withdrawal.   REVIEW OF SYSTEMS:  All 10 systems reviewed today with the patient and negative other than which is mentioned above. Of all 10 systems reviewed today with the patient and on the review of systems form and they are negative other  which is mentioned above.  CES-D was a not completed.  Physical exam remained. Constitutional, appears stated age. In the medication history put in that    PHYSICAL EXAMINATION:  Constitutional: Well appearing but some degree of obesity. Vital signs: Height 70 inches, weight  249 pounds. The BMI is 35.7, blood pressure 124/95, pulse 106, temperature 98.1 Fahrenheit. Ears, nose,  mouth and throat:  Unremarkable oropharynx.  No thyromegaly or neck masses.  Chest:  Resonant to percussion.  Clear to auscultation.  Cardiovascular:  Heart sounds normal S1, S2 without murmurs or rubs.   There is no peripheral edema.  Abdominal:  Normal bowel sounds.  No masses or tenderness.  I could not appreciate a liver edge or spleen tip.  I could not appreciate any hernias.  Lymphatics:  No cervical or  inguinal lymphadenopathy.  Central Nervous System:  No asterixis or focal neurologic findings.  Dermatologic:  Anicteric without palmar  erythema or spider angiomata.  Eyes:  Anicteric sclerae.  Pupils are equal and reactive to light.   RECENT LABS:  On 03/04/2011 her AST was 84, ALT 94, ALP 135, total bilirubin 0.3, albumin 4.4, globulins 3.8, creatinine 0.7. Her hepatitis C antibody was positive of course.   Assessment:  The patient is a 26 year old woman with history genotype 3A hepatitis C who is clinically and biochemically well compensated. She still remains interested in being treated for hepatitis C. At this time I  understand there may be a protocol available to her of interferon lambda, declatasvir, and ribavirin.   Today, I discussed the developments in treatment of hepatitis C, particularly that of therapies that are coming. We discussed the possibility of waiting until those are available versus participation  in a clinical trial. She was very much interested in participating in a trial should  she be eligible. I therefore brought Rise Paganini in to meet with her today. Rise Paganini has taken her name to add to  the list of patient's to be called for this for the protocol she has in mind. I have warned the patient; however, participation in a clinical trial is completely voluntary, is not considered standard of  care. We discussed the fact that these studies will use  experimental medications that are not approved by the FDA. I explained that the side effects and the toxicities and the benefits are not completely  worked out in these agents, that is why they are in study. The patient would like to be considered for protocol should they be available.  In my discussion today with the patient,  I discussed the nature and natural history of HCV.  We discussed the role of a liver biopsy for genotype 1 HCV, if the patient is genotype 1 HCV.  We discussed  treatment with pegylated interferon and ribavirin.  I discussed response rates and our treatment protocol for our clinic.  I reviewed the specific systems, constitutional, and psychiatric side effects of  therapy.  I emphasized any potential side effects to reflect the patient's past medical history.  I have told the patient that she must not share objects exposed to blood such as toothbrushes and razors.  I  told the patient that HCV is so rarely sexually transmitted such that it would not require changing sexual practices.  I have discussed the teratogenicity of the ribavirin.     plan:  1. I have not drawn in the lab work today anticipating that she is going to be contacted by the research coordinators regarding a screening visit for participation in the interferon lambda declatasvir ribavirin study. 2. I will wait to hear from Rise Paganini as to the patient's status. If she is to be enrolled in a protocol then she will be followed up to that. If she however, fails to enroll in protocol I have already discussed with her applications for assistance through [facility] for her medications. She will need to follow those and then once medications are obtained she will need to present for teaching visit. 3. Hepatitis A and B immune.            Marty Heck, MD   [phone]  D:  Thu Feb 07 20:25:36 2013 ; T:  Sat Feb 09 10:46:21 2013  Job #:  [id number]

## 2013-05-24 DIAGNOSIS — F32A Depression, unspecified: Secondary | ICD-10-CM | POA: Insufficient documentation

## 2013-10-07 ENCOUNTER — Emergency Department (HOSPITAL_COMMUNITY)
Admission: EM | Admit: 2013-10-07 | Discharge: 2013-10-07 | Disposition: A | Discharge status: Home / Self Care | Payer: PRIVATE HEALTH INSURANCE | Attending: Emergency Medicine | Admitting: Emergency Medicine

## 2013-10-07 ENCOUNTER — Telehealth (HOSPITAL_COMMUNITY): Payer: Self-pay | Admitting: *Deleted

## 2013-10-07 ENCOUNTER — Encounter (HOSPITAL_COMMUNITY): Payer: Self-pay | Admitting: Emergency Medicine

## 2013-10-07 ENCOUNTER — Emergency Department (HOSPITAL_COMMUNITY): Payer: PRIVATE HEALTH INSURANCE

## 2013-10-07 DIAGNOSIS — F17200 Nicotine dependence, unspecified, uncomplicated: Secondary | ICD-10-CM | POA: Insufficient documentation

## 2013-10-07 DIAGNOSIS — Z9089 Acquired absence of other organs: Secondary | ICD-10-CM | POA: Insufficient documentation

## 2013-10-07 DIAGNOSIS — Z3202 Encounter for pregnancy test, result negative: Secondary | ICD-10-CM | POA: Insufficient documentation

## 2013-10-07 DIAGNOSIS — R1011 Right upper quadrant pain: Secondary | ICD-10-CM | POA: Insufficient documentation

## 2013-10-07 DIAGNOSIS — Z79899 Other long term (current) drug therapy: Secondary | ICD-10-CM | POA: Insufficient documentation

## 2013-10-07 DIAGNOSIS — Z8619 Personal history of other infectious and parasitic diseases: Secondary | ICD-10-CM

## 2013-10-07 DIAGNOSIS — R110 Nausea: Secondary | ICD-10-CM | POA: Insufficient documentation

## 2013-10-07 DIAGNOSIS — R7989 Other specified abnormal findings of blood chemistry: Secondary | ICD-10-CM | POA: Insufficient documentation

## 2013-10-07 HISTORY — DX: Unspecified viral hepatitis C without hepatic coma: B19.20

## 2013-10-07 HISTORY — DX: Peptic ulcer, site unspecified, unspecified as acute or chronic, without hemorrhage or perforation: K27.9

## 2013-10-07 HISTORY — DX: Dorsalgia, unspecified: M54.9

## 2013-10-07 HISTORY — DX: Other chronic pain: G89.29

## 2013-10-07 LAB — URINALYSIS, ROUTINE W REFLEX MICROSCOPIC
Glucose, UA: NEGATIVE mg/dL
Ketones, ur: NEGATIVE mg/dL
Leukocytes, UA: NEGATIVE
pH: 6.5 (ref 5.0–8.0)

## 2013-10-07 LAB — COMPREHENSIVE METABOLIC PANEL
ALT: 107 U/L — ABNORMAL HIGH (ref 0–35)
AST: 153 U/L — ABNORMAL HIGH (ref 0–37)
Albumin: 3.3 g/dL — ABNORMAL LOW (ref 3.5–5.2)
Alkaline Phosphatase: 164 U/L — ABNORMAL HIGH (ref 39–117)
Chloride: 99 mEq/L (ref 96–112)
Creatinine, Ser: 0.61 mg/dL (ref 0.50–1.10)
Potassium: 3.6 mEq/L (ref 3.5–5.1)
Sodium: 135 mEq/L (ref 135–145)
Total Bilirubin: 0.5 mg/dL (ref 0.3–1.2)

## 2013-10-07 LAB — CBC WITH DIFFERENTIAL/PLATELET
Basophils Absolute: 0 10*3/uL (ref 0.0–0.1)
Basophils Relative: 0 % (ref 0–1)
MCHC: 32.7 g/dL (ref 30.0–36.0)
Neutro Abs: 2.5 10*3/uL (ref 1.7–7.7)
Neutrophils Relative %: 52 % (ref 43–77)
Platelets: 167 10*3/uL (ref 150–400)
RDW: 13.2 % (ref 11.5–15.5)
WBC: 4.7 10*3/uL (ref 4.0–10.5)

## 2013-10-07 MED ORDER — HYDROMORPHONE HCL PF 2 MG/ML IJ SOLN
2.0000 mg | Freq: Once | INTRAMUSCULAR | Status: AC
  Administered 2013-10-07: 2 mg via INTRAVENOUS
  Filled 2013-10-07: qty 1

## 2013-10-07 MED ORDER — PANTOPRAZOLE SODIUM 40 MG IV SOLR
40.0000 mg | Freq: Once | INTRAVENOUS | Status: AC
  Administered 2013-10-07: 40 mg via INTRAVENOUS
  Filled 2013-10-07: qty 40

## 2013-10-07 MED ORDER — SODIUM CHLORIDE 0.9 % IV SOLN
1000.0000 mL | Freq: Once | INTRAVENOUS | Status: AC
  Administered 2013-10-07: 1000 mL via INTRAVENOUS

## 2013-10-07 MED ORDER — ONDANSETRON HCL 4 MG/2ML IJ SOLN
4.0000 mg | Freq: Once | INTRAMUSCULAR | Status: AC
  Administered 2013-10-07: 4 mg via INTRAVENOUS
  Filled 2013-10-07: qty 2

## 2013-10-07 MED ORDER — SODIUM CHLORIDE 0.9 % IV SOLN: 1000.0000 mL | INTRAVENOUS | Status: DC

## 2013-10-07 MED ORDER — SODIUM CHLORIDE 0.9 % IJ SOLN
INTRAMUSCULAR | Status: AC
  Filled 2013-10-07: qty 500

## 2013-10-07 NOTE — ED Provider Notes (Signed)
Patient is a 27 year old female history of hepatitis C and laparoscopic cholecystectomy 8 years ago. She was initially seen by Dr. Roxanne Mins for right upper quadrant pain and signed out to me at shift change. She is awaiting results of her laboratory studies. These returned and revealed elevations of her transaminases. A CT scan of the abdomen and pelvis was performed to evaluate for any ductal dilatation or possibility of a retained gallstone. This was performed without contrast that she has a contrast allergy. The results were negative. At this point she appears more comfortable and I believe stable for discharge. She has no gastroenterologist or infectious disease Dr. that she is seen recently. I've advised her to call Dr. Luan Pulling on Monday to arrange a followup appointment and return in the meantime if her symptoms worsen or change. She has no fever and no elevation of white count. Her abdominal exam was repeated. She has some right upper quadrant tenderness however there is no rebound or guarding. There is no evidence of an acute abdomen. She understands to return if she develops high fever or other new or concerning symptoms.  Veryl Speak, MD 10/07/13 1034

## 2013-10-07 NOTE — ED Provider Notes (Signed)
CSN: [id number]     Arrival date & time 10/07/13  0535 History   First MD Initiated Contact with Patient 10/07/13 [phone]     Chief Complaint  Patient presents with  . ribcage pain    (Consider location/radiation/quality/duration/timing/severity/associated sxs/prior Treatment) The history is provided by the patient.   27 year old female is complaining of pain in the right upper abdomen for the last 3 days. Pain is worse with deep breath and cough also worse with eating. Associated nausea but no vomiting. She denies fever or chills. She states that she had been coughing about a week ago but that has resolved but she wonders if that may have triggered something in her abdomen. She does have history of bleeding ulcers but this feels different. She is status post cholecystectomy. Pain is severe and she rates it 8/10. Of note, she takes methadone daily for chronic pain.  History reviewed. No pertinent past medical history. Past Surgical History  Procedure Laterality Date  . Cholecystectomy     No family history on file. History  Substance Use Topics  . Smoking status: Current Every Day Smoker  . Smokeless tobacco: Not on file  . Alcohol Use: No   OB History   Grav Para Term Preterm Abortions TAB SAB Ect Mult Living                 Review of Systems  All other systems reviewed and are negative.    Allergies  Ivp dye; Ketorolac tromethamine; and Nsaids  Home Medications   Current Outpatient Rx  Name  Route  Sig  Dispense  Refill  . diazepam (VALIUM) 5 MG tablet   Oral   Take 5 mg by mouth every 12 (twelve) hours as needed for anxiety.         . furosemide (LASIX) 40 MG tablet   Oral   Take 40 mg by mouth 2 (two) times daily.         . methadone (DOLOPHINE) 10 MG tablet   Oral   Take 120 mg by mouth every 8 (eight) hours.         . sertraline (ZOLOFT) 50 MG tablet   Oral   Take 50 mg by mouth daily.          BP 150/81  Pulse 93  Temp(Src) 98.5 F (36.9 C)  (Oral)  Resp 20  Ht 5' 10"  (1.778 m)  Wt 250 lb (113.399 kg)  BMI 35.87 kg/m2  SpO2 100%  LMP 08/07/2013 Physical Exam  Nursing note and vitals reviewed.  Morbidly obese 27 year old female, resting comfortably and in no acute distress. Vital signs are significant for hypertension with blood pressure 150/81. Oxygen saturation is 100%, which is normal. Head is normocephalic and atraumatic. PERRLA, EOMI. Oropharynx is clear. Neck is nontender and supple without adenopathy or JVD. Back is nontender and there is no CVA tenderness. Lungs are clear without rales, wheezes, or rhonchi. Chest is nontender. Heart has regular rate and rhythm without murmur. Abdomen is soft, flat, with moderate tenderness in the epigastric area and right upper quadrant. There is no rebound or guarding. There are no masses or hepatosplenomegaly and peristalsis is hypoactive. Extremities have no cyanosis or edema, full range of motion is present. Skin is warm and dry without rash. Neurologic: Mental status is normal, cranial nerves are intact, there are no motor or sensory deficits.  ED Course  Procedures (including critical care time)  MDM   1. RUQ abdominal pain  2. Morbid obesity    Right upper quadrant pain of uncertain cause. She is status post cholecystectomy. Screening labs will be obtained and she'll be given hydromorphone for pain relief. Case will be signed out to Dr. Stark Jock pending lab results.    Delora Fuel, MD 03/70/48 8891

## 2013-10-07 NOTE — ED Notes (Signed)
Sharp stabbing pain to right rib area that is worse with palpation, movement and deep breathing.  Pt denies injury or trauma

## 2013-10-10 LAB — HEPATITIS PANEL, ACUTE
HCV Ab: REACTIVE — AB
Hep A IgM: REACTIVE — AB

## 2013-10-13 ENCOUNTER — Ambulatory Visit (HOSPITAL_COMMUNITY)
Admission: RE | Admit: 2013-10-13 | Discharge: 2013-10-13 | Disposition: A | Discharge status: Home / Self Care | Payer: PRIVATE HEALTH INSURANCE | Source: Ambulatory Visit | Attending: Pulmonary Disease | Admitting: Pulmonary Disease

## 2013-10-13 DIAGNOSIS — R609 Edema, unspecified: Secondary | ICD-10-CM | POA: Insufficient documentation

## 2013-10-13 DIAGNOSIS — I517 Cardiomegaly: Secondary | ICD-10-CM

## 2013-10-13 DIAGNOSIS — Z6835 Body mass index (BMI) 35.0-35.9, adult: Secondary | ICD-10-CM | POA: Insufficient documentation

## 2013-10-13 DIAGNOSIS — F17200 Nicotine dependence, unspecified, uncomplicated: Secondary | ICD-10-CM | POA: Insufficient documentation

## 2013-10-13 DIAGNOSIS — B1920 Unspecified viral hepatitis C without hepatic coma: Secondary | ICD-10-CM | POA: Insufficient documentation

## 2013-10-13 NOTE — Progress Notes (Signed)
*  PRELIMINARY RESULTS* Echocardiogram 2D Echocardiogram has been performed.  [place], [place] 10/13/2013, 1:37 PM

## 2013-10-17 ENCOUNTER — Emergency Department (HOSPITAL_COMMUNITY): Payer: PRIVATE HEALTH INSURANCE

## 2013-10-17 ENCOUNTER — Emergency Department (HOSPITAL_COMMUNITY)
Admission: EM | Admit: 2013-10-17 | Discharge: 2013-10-17 | Disposition: A | Discharge status: Home / Self Care | Payer: PRIVATE HEALTH INSURANCE | Attending: Emergency Medicine | Admitting: Emergency Medicine

## 2013-10-17 ENCOUNTER — Encounter (HOSPITAL_COMMUNITY): Payer: Self-pay | Admitting: Emergency Medicine

## 2013-10-17 DIAGNOSIS — Z8711 Personal history of peptic ulcer disease: Secondary | ICD-10-CM | POA: Insufficient documentation

## 2013-10-17 DIAGNOSIS — M25511 Pain in right shoulder: Secondary | ICD-10-CM

## 2013-10-17 DIAGNOSIS — Z3202 Encounter for pregnancy test, result negative: Secondary | ICD-10-CM | POA: Insufficient documentation

## 2013-10-17 DIAGNOSIS — M25519 Pain in unspecified shoulder: Secondary | ICD-10-CM | POA: Insufficient documentation

## 2013-10-17 DIAGNOSIS — Z8619 Personal history of other infectious and parasitic diseases: Secondary | ICD-10-CM | POA: Insufficient documentation

## 2013-10-17 DIAGNOSIS — G8929 Other chronic pain: Secondary | ICD-10-CM | POA: Insufficient documentation

## 2013-10-17 DIAGNOSIS — Z79899 Other long term (current) drug therapy: Secondary | ICD-10-CM | POA: Insufficient documentation

## 2013-10-17 DIAGNOSIS — R52 Pain, unspecified: Secondary | ICD-10-CM | POA: Insufficient documentation

## 2013-10-17 DIAGNOSIS — F17200 Nicotine dependence, unspecified, uncomplicated: Secondary | ICD-10-CM | POA: Insufficient documentation

## 2013-10-17 MED ORDER — NAPROXEN 500 MG PO TABS: 500.0000 mg | ORAL_TABLET | Freq: Two times a day (BID) | ORAL | Status: DC

## 2013-10-17 MED ORDER — HYDROCODONE-ACETAMINOPHEN 5-325 MG PO TABS
1.0000 | ORAL_TABLET | Freq: Once | ORAL | Status: DC
  Filled 2013-10-17: qty 1

## 2013-10-17 NOTE — ED Provider Notes (Signed)
CSN: [id number]     Arrival date & time 10/17/13  0321 History   First MD Initiated Contact with Patient 10/17/13 [phone]     Chief Complaint  Patient presents with  . Shoulder Pain   (Consider location/radiation/quality/duration/timing/severity/associated sxs/prior Treatment) Patient is a 27 y.o. female presenting with shoulder pain. The history is provided by the patient.  Shoulder Pain Pertinent negatives include no chest pain, no abdominal pain, no headaches and no shortness of breath.   patient with complete right shoulder pain no actual injury. The pain started hurting one half days ago was painful upon awakening. No numbness to the hand. Just pain with range of motion at the shoulder and difficulty getting her arm above her head. Pain is worse is 10 out of 10 with movement at rest it's about 4/10. Sometimes the pain shoots from the shoulder down towards the elbow. No other complaints.  Past Medical History  Diagnosis Date  . Hepatitis C   . Chronic back pain   . Peptic ulcer    Past Surgical History  Procedure Laterality Date  . Cholecystectomy     No family history on file. History  Substance Use Topics  . Smoking status: Current Every Day Smoker  . Smokeless tobacco: Not on file  . Alcohol Use: No   OB History   Grav Para Term Preterm Abortions TAB SAB Ect Mult Living                 Review of Systems  Constitutional: Negative for fever.  HENT: Negative for congestion.   Respiratory: Negative for shortness of breath.   Cardiovascular: Negative for chest pain.  Gastrointestinal: Negative for abdominal pain.  Genitourinary: Negative for dysuria.  Musculoskeletal: Negative for back pain, neck pain and neck stiffness.  Skin: Negative for rash.  Neurological: Negative for headaches.  Hematological: Does not bruise/bleed easily.  Psychiatric/Behavioral: Negative for confusion.    Allergies  Ketorolac tromethamine; Ivp dye; and Nsaids  Home Medications   Current  Outpatient Rx  Name  Route  Sig  Dispense  Refill  . diazepam (VALIUM) 5 MG tablet   Oral   Take 5 mg by mouth every 12 (twelve) hours as needed for anxiety.         . furosemide (LASIX) 40 MG tablet   Oral   Take 40 mg by mouth 2 (two) times daily.         . methadone (DOLOPHINE) 10 MG tablet   Oral   Take 40 mg by mouth 3 (three) times daily.          . sertraline (ZOLOFT) 50 MG tablet   Oral   Take 50 mg by mouth daily.         . naproxen (NAPROSYN) 500 MG tablet   Oral   Take 1 tablet (500 mg total) by mouth 2 (two) times daily.   14 tablet   1    BP 108/70  Pulse 98  Temp(Src) 98.1 F (36.7 C) (Oral)  Resp 20  Ht 5' 10"  (1.778 m)  Wt 250 lb (113.399 kg)  BMI 35.87 kg/m2  SpO2 94%  LMP 08/07/2013 Physical Exam  Nursing note and vitals reviewed. Constitutional: She is oriented to person, place, and time. She appears well-developed and well-nourished. No distress.  HENT:  Head: Normocephalic and atraumatic.  Mouth/Throat: Oropharynx is clear and moist.  Eyes: Conjunctivae and EOM are normal. Pupils are equal, round, and reactive to light.  Neck: Normal range of motion.  Cardiovascular: Normal rate, regular rhythm and normal heart sounds.   No murmur heard. Pulmonary/Chest: Effort normal and breath sounds normal. No respiratory distress.  Abdominal: Bowel sounds are normal. There is no tenderness.  Musculoskeletal: She exhibits no tenderness.  Normal except for right shoulder area with decreased range of motion not able to get arm completely above head without significant pain. Distal radial pulses 2+ Refill is 1 second no sensory deficit good range of motion at the fingers and wrist and elbow. Decreased range of motion at shoulder no deformity.  Neurological: She is alert and oriented to person, place, and time. No cranial nerve deficit. She exhibits normal muscle tone. Coordination normal.  Skin: Skin is warm. No rash noted. No erythema.    ED Course   Procedures (including critical care time) Labs Review Labs Reviewed  POCT PREGNANCY, URINE   Results for orders placed during the hospital encounter of 10/17/13  POCT PREGNANCY, URINE      Result Value Range   Preg Test, Ur NEGATIVE  NEGATIVE    Imaging Review Dg Shoulder Right  10/17/2013   CLINICAL DATA:  Right shoulder pain for 1 day.  No injury.  EXAM: RIGHT SHOULDER - 2+ VIEW  COMPARISON:  None.  FINDINGS: There is no evidence of fracture or dislocation. There is no evidence of arthropathy or other focal bone abnormality. Soft tissues are unremarkable.  IMPRESSION: Negative.   Electronically Signed   By: Marin Olp M.D.   On: 10/17/2013 07:07    EKG Interpretation   None       MDM   1. Shoulder pain, acute, right    Patient with acute right shoulder pain increased pain with range of motion maybe rotator cuff injury. No bony injuries on x-ray. We'll treat with sling and followup with orthopedics. Patient not able to take narcotic pain medicine. Patient will be treated with Naprosyn has not had any trouble with that in the past patient has been taking Motrin. Sling provided work note.    Mervin Kung, MD 10/17/13 0730

## 2013-10-17 NOTE — ED Notes (Signed)
Pt reports right shoulder pain. Pt denies any injury or activity. Pt w/ good pulses & sensation. Limited movement due to pain.

## 2014-08-25 ENCOUNTER — Emergency Department (HOSPITAL_COMMUNITY): Payer: No Typology Code available for payment source

## 2014-08-25 ENCOUNTER — Encounter (HOSPITAL_COMMUNITY): Payer: Self-pay | Admitting: Emergency Medicine

## 2014-08-25 ENCOUNTER — Emergency Department (HOSPITAL_COMMUNITY)
Admission: EM | Admit: 2014-08-25 | Discharge: 2014-08-25 | Discharge status: Against Medical Advice | Payer: No Typology Code available for payment source | Attending: Emergency Medicine | Admitting: Emergency Medicine

## 2014-08-25 DIAGNOSIS — J9801 Acute bronchospasm: Secondary | ICD-10-CM

## 2014-08-25 DIAGNOSIS — J159 Unspecified bacterial pneumonia: Secondary | ICD-10-CM | POA: Diagnosis not present

## 2014-08-25 DIAGNOSIS — Z720 Tobacco use: Secondary | ICD-10-CM | POA: Diagnosis not present

## 2014-08-25 DIAGNOSIS — G43909 Migraine, unspecified, not intractable, without status migrainosus: Secondary | ICD-10-CM | POA: Diagnosis not present

## 2014-08-25 DIAGNOSIS — R0602 Shortness of breath: Secondary | ICD-10-CM

## 2014-08-25 DIAGNOSIS — Z79899 Other long term (current) drug therapy: Secondary | ICD-10-CM | POA: Diagnosis not present

## 2014-08-25 DIAGNOSIS — Z792 Long term (current) use of antibiotics: Secondary | ICD-10-CM | POA: Diagnosis not present

## 2014-08-25 DIAGNOSIS — G8929 Other chronic pain: Secondary | ICD-10-CM | POA: Insufficient documentation

## 2014-08-25 DIAGNOSIS — J189 Pneumonia, unspecified organism: Secondary | ICD-10-CM

## 2014-08-25 DIAGNOSIS — R05 Cough: Secondary | ICD-10-CM

## 2014-08-25 DIAGNOSIS — Z8719 Personal history of other diseases of the digestive system: Secondary | ICD-10-CM | POA: Diagnosis not present

## 2014-08-25 DIAGNOSIS — R059 Cough, unspecified: Secondary | ICD-10-CM

## 2014-08-25 HISTORY — DX: Migraine, unspecified, not intractable, without status migrainosus: G43.909

## 2014-08-25 HISTORY — DX: Insomnia, unspecified: G47.00

## 2014-08-25 HISTORY — DX: Other chronic pain: G89.29

## 2014-08-25 HISTORY — DX: Unspecified abdominal pain: R10.9

## 2014-08-25 LAB — BASIC METABOLIC PANEL
Anion gap: 15 (ref 5–15)
BUN: 7 mg/dL (ref 6–23)
CHLORIDE: 96 meq/L (ref 96–112)
CO2: 22 meq/L (ref 19–32)
Calcium: 8.7 mg/dL (ref 8.4–10.5)
Creatinine, Ser: 0.71 mg/dL (ref 0.50–1.10)
GFR calc Af Amer: >90 mL/min (ref >90)
GFR calc non Af Amer: >90 mL/min (ref >90)
GLUCOSE: 158 mg/dL — AB (ref 70–99)
POTASSIUM: 3.6 meq/L — AB (ref 3.7–5.3)
SODIUM: 133 meq/L — AB (ref 137–147)

## 2014-08-25 LAB — I-STAT BETA HCG BLOOD, ED (MC, WL, AP ONLY): I-stat hCG, quantitative: <5 m[IU]/mL (ref <5)

## 2014-08-25 LAB — CBC WITH DIFFERENTIAL/PLATELET
Basophils Absolute: 0 10*3/uL (ref 0.0–0.1)
Basophils Relative: 0 % (ref 0–1)
EOS PCT: 2 % (ref 0–5)
Eosinophils Absolute: 0.1 10*3/uL (ref 0.0–0.7)
HCT: 37.7 % (ref 36.0–46.0)
Hemoglobin: 13.1 g/dL (ref 12.0–15.0)
LYMPHS PCT: 19 % (ref 12–46)
Lymphs Abs: 1.4 10*3/uL (ref 0.7–4.0)
MCH: 31.4 pg (ref 26.0–34.0)
MCHC: 34.7 g/dL (ref 30.0–36.0)
MCV: 90.4 fL (ref 78.0–100.0)
Monocytes Absolute: 0.2 10*3/uL (ref 0.1–1.0)
Monocytes Relative: 3 % (ref 3–12)
NEUTROS ABS: 5.7 10*3/uL (ref 1.7–7.7)
NEUTROS PCT: 77 % (ref 43–77)
PLATELETS: 107 10*3/uL — AB (ref 150–400)
RBC: 4.17 MIL/uL (ref 3.87–5.11)
RDW: 12.5 % (ref 11.5–15.5)
WBC: 7.4 10*3/uL (ref 4.0–10.5)

## 2014-08-25 MED ORDER — IPRATROPIUM-ALBUTEROL 0.5-2.5 (3) MG/3ML IN SOLN
3.0000 mL | Freq: Once | RESPIRATORY_TRACT | Status: AC
  Administered 2014-08-25: 3 mL via RESPIRATORY_TRACT
  Filled 2014-08-25: qty 3

## 2014-08-25 MED ORDER — ALBUTEROL SULFATE (2.5 MG/3ML) 0.083% IN NEBU
2.5000 mg | INHALATION_SOLUTION | Freq: Once | RESPIRATORY_TRACT | Status: AC
  Administered 2014-08-25: 2.5 mg via RESPIRATORY_TRACT
  Filled 2014-08-25: qty 3

## 2014-08-25 MED ORDER — PREDNISONE 50 MG PO TABS
60.0000 mg | ORAL_TABLET | Freq: Once | ORAL | Status: AC
  Administered 2014-08-25: 60 mg via ORAL
  Filled 2014-08-25 (×2): qty 1

## 2014-08-25 MED ORDER — ALBUTEROL SULFATE HFA 108 (90 BASE) MCG/ACT IN AERS
2.0000 | INHALATION_SPRAY | RESPIRATORY_TRACT | Status: AC
  Administered 2014-08-25: 2 via RESPIRATORY_TRACT
  Filled 2014-08-25: qty 6.7

## 2014-08-25 MED ORDER — PREDNISONE 20 MG PO TABS: 40.0000 mg | ORAL_TABLET | Freq: Every day | ORAL | Status: DC

## 2014-08-25 NOTE — ED Notes (Signed)
Patient c/o cough with shortness of breath and fever. Denies any nausea or vomiting. Denies any chest pain.Per patient felt fine on Wednesday and got flu shot.

## 2014-08-25 NOTE — ED Provider Notes (Signed)
CSN: [id number]     Arrival date & time 08/25/14  1526 History   First MD Initiated Contact with Patient 08/25/14 1805     Chief Complaint  Patient presents with  . Shortness of Breath     HPI Pt was seen at 1820.  Per pt, c/o gradual onset and worsening of persistent cough, wheezing and SOB for the past 1 week, worse over the past 2 days. Pt states her PMD rx bactrim without change in her symptoms.  Denies CP/palpitations, no back pain, no abd pain, no N/V/D, no fevers, no rash.     Past Medical History  Diagnosis Date  . Hepatitis C   . Chronic back pain   . Peptic ulcer   . Migraine headache   . Insomnia   . Chronic abdominal pain    Past Surgical History  Procedure Laterality Date  . Cholecystectomy     Family History  Problem Relation Age of Onset  . Hypertension Mother   . Hyperlipidemia Other    History  Substance Use Topics  . Smoking status: Current Every Day Smoker -- 0.50 packs/day for 5 years    Types: Cigarettes  . Smokeless tobacco: Never Used  . Alcohol Use: No    Review of Systems ROS: Statement: All systems negative except as marked or noted in the HPI; Constitutional: Negative for fever and +subjective home fevers/chills. ; ; Eyes: Negative for eye pain, redness and discharge. ; ; ENMT: Negative for ear pain, hoarseness, nasal congestion, sinus pressure and sore throat. ; ; Cardiovascular: Negative for chest pain, palpitations, diaphoresis, and peripheral edema. ; ; Respiratory: +cough, wheezing, SOB. Negative for stridor. ; ; Gastrointestinal: Negative for nausea, vomiting, diarrhea, abdominal pain, blood in stool, hematemesis, jaundice and rectal bleeding. . ; ; Genitourinary: Negative for dysuria, flank pain and hematuria. ; ; Musculoskeletal: Negative for back pain and neck pain. Negative for swelling and trauma.; ; Skin: Negative for pruritus, rash, abrasions, blisters, bruising and skin lesion.; ; Neuro: Negative for headache, lightheadedness and neck  stiffness. Negative for weakness, altered level of consciousness , altered mental status, extremity weakness, paresthesias, involuntary movement, seizure and syncope.     Allergies  Ketorolac tromethamine; Ivp dye; and Nsaids  Home Medications   Prior to Admission medications   Medication Sig Start Date End Date Taking? Authorizing Provider  acetaminophen (TYLENOL) 500 MG tablet Take 1,000 mg by mouth every 6 (six) hours as needed for mild pain.   Yes Historical Provider, MD  clonazePAM (KLONOPIN) 1 MG tablet Take 1 mg by mouth 3 (three) times daily as needed for anxiety.   Yes Historical Provider, MD  methadone (DOLOPHINE) 10 MG tablet Take 60 mg by mouth at bedtime.    Yes Historical Provider, MD  sulfamethoxazole-trimethoprim (BACTRIM DS) 800-160 MG per tablet Take 1 tablet by mouth 2 (two) times daily.   Yes Historical Provider, MD    Filed Vitals:   08/25/14 1540 08/25/14 1810 08/25/14 1811 08/25/14 1916  BP: 136/73  109/71   Pulse: 117  102   Temp: 99.5 F (37.5 C)     TempSrc: Oral     Resp: 16  16   SpO2: 93% 93% 92% 85%   BP 132/59  Pulse 119  Temp(Src) 98.3 F (36.8 C) (Oral)  Resp 20  SpO2 95%  LMP 06/02/2014   Physical Exam 1825: Physical examination:  Nursing notes reviewed; Vital signs and O2 SAT reviewed;  Constitutional: Well developed, Well nourished, Well hydrated, In no  acute distress; Head:  Normocephalic, atraumatic; Eyes: EOMI, PERRL, No scleral icterus; ENMT: TM's clear bilat. +edemetous nasal turbinates bilat with clear rhinorrhea. Mouth and pharynx normal, Mucous membranes moist; Neck: Supple, Full range of motion, No lymphadenopathy; Cardiovascular: Tachycardic rate and rhythm, No murmur, rub, or gallop; Respiratory: Breath sounds coarse & equal bilaterally, faint scattered exp wheezes. No audible wheezing. Speaking full sentences with ease, Normal respiratory effort/excursion; Chest: Nontender, Movement normal; Abdomen: Soft, Nontender, Nondistended,  Normal bowel sounds; Genitourinary: No CVA tenderness; Extremities: Pulses normal, No tenderness, No edema, No calf edema or asymmetry.; Neuro: AA&Ox3, Major CN grossly intact.  Speech clear. No gross focal motor or sensory deficits in extremities. Climbs on and off stretcher easily by herself. Gait steady.; Skin: Color normal, Warm, Dry.   ED Course  Procedures     EKG Interpretation   Date/Time:  Saturday August 25 2014 15:41:18 EDT Ventricular Rate:  111 PR Interval:  180 QRS Duration: 84 QT Interval:  344 QTC Calculation: 467 R Axis:   80 Text Interpretation:  Sinus tachycardia Otherwise normal ECG Confirmed by  Lacinda Axon  MD, BRIAN ([id number]) on 08/25/2014 5:09:10 PM      MDM  MDM Reviewed: previous chart, nursing note and vitals Reviewed previous: ECG Interpretation: x-ray and ECG   Results for orders placed during the hospital encounter of 08/25/14  CBC WITH DIFFERENTIAL      Result Value Ref Range   WBC 7.4  4.0 - 10.5 K/uL   RBC 4.17  3.87 - 5.11 MIL/uL   Hemoglobin 13.1  12.0 - 15.0 g/dL   HCT 37.7  36.0 - 46.0 %   MCV 90.4  78.0 - 100.0 fL   MCH 31.4  26.0 - 34.0 pg   MCHC 34.7  30.0 - 36.0 g/dL   RDW 12.5  11.5 - 15.5 %   Platelets 107 (*) 150 - 400 K/uL   Neutrophils Relative % 77  43 - 77 %   Neutro Abs 5.7  1.7 - 7.7 K/uL   Lymphocytes Relative 19  12 - 46 %   Lymphs Abs 1.4  0.7 - 4.0 K/uL   Monocytes Relative 3  3 - 12 %   Monocytes Absolute 0.2  0.1 - 1.0 K/uL   Eosinophils Relative 2  0 - 5 %   Eosinophils Absolute 0.1  0.0 - 0.7 K/uL   Basophils Relative 0  0 - 1 %   Basophils Absolute 0.0  0.0 - 0.1 K/uL  BASIC METABOLIC PANEL      Result Value Ref Range   Sodium 133 (*) 137 - 147 mEq/L   Potassium 3.6 (*) 3.7 - 5.3 mEq/L   Chloride 96  96 - 112 mEq/L   CO2 22  19 - 32 mEq/L   Glucose, Bld 158 (*) 70 - 99 mg/dL   BUN 7  6 - 23 mg/dL   Creatinine, Ser 0.71  0.50 - 1.10 mg/dL   Calcium 8.7  8.4 - 10.5 mg/dL   GFR calc non Af Amer >90  >90  mL/min   GFR calc Af Amer >90  >90 mL/min   Anion gap 15  5 - 15  I-STAT BETA HCG BLOOD, ED (MC, WL, AP ONLY)      Result Value Ref Range   I-stat hCG, quantitative <5.0  <5 mIU/mL   Comment 3             Dg Chest 2 View 08/25/2014   CLINICAL DATA:  Shortness of breath.  Cough. Headache. Fever. Recent flu shot administration.  EXAM: CHEST  2 VIEW  COMPARISON:  08/24/2010  FINDINGS: Low lung volumes are present, causing crowding of the pulmonary vasculature. Airway thickening is present, suggesting bronchitis or reactive airways disease. Airspace opacity at the right lung base favoring atelectasis although pneumonia is not readily excluded.  No pleural effusion.  IMPRESSION: 1. Airway thickening is present, suggesting bronchitis or reactive airways disease. 2. Low lung volumes, with a airspace opacity along the right hemidiaphragm. This could reflect recurrent atelectasis or pneumonia ; the patient has had airspace opacity in this vicinity before included on 08/24/2010 and 10/07/2013. On the CT scan from December does appear to have right middle lobe and right lower lobe components and an appearance favoring atelectasis. No specific tracheobronchial lesion is identified to cause this atelectasis, but if chronic an unexplained then bronchoscopy may be warranted.   Electronically Signed   By: Sherryl Barters M.D.   On: 08/25/2014 16:41    2000:  Pt given PO prednisone and neb treatment. Pt states she "feels better" but Sats now 86-89% R/A. Pt appears NAD, sitting slumped down on stretcher using handheld electronic device, resps are shallow and without distress, lungs diminished bilat. CXR with airspace opacity RLL, favoring atelectasis per review of previous CT scans. Pt, however, does have hx of hypoxia with CT revealing multifocal infiltrates (2011). This d/w pt: will perform CT chest without contrast due to reported allergy. Doubt PE with low risk [place].   2020:  2nd neb completed. Lungs continue  diminished bilat, resps without distress, speaking full sentences without difficulty. Sats on neb 97%, off of neb Sats drop to 87-89% R/A. Incentive spirometer teach and treat performed: Sats immediately increased to 96-97% R/A.      Ct Chest Wo Contrast 08/25/2014   CLINICAL DATA:  Shortness of breath and cough for 2 days, fever, BILATERAL anterior lower rib pain, history pneumonia, smoking  EXAM: CT CHEST WITHOUT CONTRAST  TECHNIQUE: Multidetector CT imaging of the chest was performed following the standard protocol without IV contrast.  COMPARISON:  Chest radiograph 08/25/2014, CT chest 08/21/2010  FINDINGS: Aorta normal caliber.  No thoracic adenopathy.  Gallbladder surgically absent.  Spleen appears enlarged.  Patchy airspace infiltrate in central LEFT upper lobe question pneumonia, although overall this appears better than on the previous study.  Mild atelectasis in RIGHT lower lobe.  No endobronchial lesions identified.  Persistent elevation of RIGHT diaphragm with generally low lung volumes.  Remaining lungs clear.  No pleural effusion or pneumothorax.  Bones unremarkable.  IMPRESSION: Chronically decreased lung volumes with mild RIGHT basilar atelectasis and chronic elevation of RIGHT diaphragm.  Minimal patchy airspace infiltrate in LEFT upper lobe question pneumonia.   Electronically Signed   By: Lavonia Dana M.D.   On: 08/25/2014 21:24    2140:  Dx and testing d/w pt.  Questions answered.  Verb understanding.  Pt sitting slumped down on stretcher, using handheld electronic device, tol PO well, resps without distress. Sats are still 90-92% R/A, lungs diminished. Pt used I.S. again with Sats increasing to 97-99% R/A. Pt states she "feels better" after nebs and steroid and wants to go home.  Pt informed re: dx testing results, and persistent O2 desaturations, and that I recommend observation admission for further evaluation.  Pt refuses admission.  I encouraged pt to stay, continues to refuse,  stating "my job will fire me, I can't take the chance."  I offered pt a work note to explain she was  being admitted to the hospital. Pt again refused. Pt makes her own medical decisions.  Risks of AMA explained to pt, including, but not limited to:  Respiratory distress/failure/arrest, intubation, stroke, heart attack, cardiac arrythmia ("irregular heart rate/beat"), "passing out," temporary and/or permanent disability, death.  Pt verb understanding and continues to refuse admission, understanding the consequences of her decision.  I encouraged pt to follow up with her PMD on Monday and return to the ED immediately if symptoms worsen, she changes her mind about admission, or for any other concerns.  Pt verb understanding, agreeable.  Pt already has antibiotic rx. Pt was given an I.S., an albuterol MDI, and rx prednisone. Instructions regarding use of I.S. and MDI reviewed with pt by RT before discharge.     Francine Graven, DO 08/28/14 1342

## 2014-08-25 NOTE — Discharge Instructions (Signed)
Emergency Department Resource Guide 1) Find a Doctor and Pay Out of Pocket Although you won't have to find out who is covered by your insurance plan, it is a good idea to ask around and get recommendations. You will then need to call the office and see if the doctor you have chosen will accept you as a new patient and what types of options they offer for patients who are self-pay. Some doctors offer discounts or will set up payment plans for their patients who do not have insurance, but you will need to ask so you aren't surprised when you get to your appointment.  2) Contact Your Local Health Department Not all health departments have doctors that can see patients for sick visits, but many do, so it is worth a call to see if yours does. If you don't know where your local health department is, you can check in your phone book. The CDC also has a tool to help you locate your state's health department, and many state websites also have listings of all of their local health departments.  3) Find a [facility] If your illness is not likely to be very severe or complicated, you may want to try a walk in clinic. These are popping up all over the country in pharmacies, drugstores, and shopping centers. They're usually staffed by nurse practitioners or physician assistants that have been trained to treat common illnesses and complaints. They're usually fairly quick and inexpensive. However, if you have serious medical issues or chronic medical problems, these are probably not your best option.  No Primary Care Doctor: - Call Health Connect at  [phone] - they can help you locate a primary care doctor that  accepts your insurance, provides certain services, etc. - Physician Referral Service- [phone]  Chronic Pain Problems: Organization         Address  Phone   Notes  [facility]  [phone] Patients need to be referred by their primary care doctor.   Medication  Assistance: Organization         Address  Phone   Notes  [facility] Medication [facility] [address] [place], [place] [address] [phone] --Must be a resident of [facility] -- Must have NO insurance coverage whatsoever (no Medicaid/ Medicare, etc.) -- The pt. MUST have a primary care doctor that directs their care regularly and follows them in the community   [facility]  [phone]   [employer]  [phone]    Agencies that provide inexpensive medical care: Organization         Address  Phone   Notes  [place]  [phone]   [facility]    [phone]   [facility], [place] [address] [phone]   [place] [address] [place] [phone]   Planned Parenthood    [phone]   [facility]    [phone]   [place] and [place] [address], [place] Phone:  [phone], Fax:  [phone] Hours of Operation:  9 am - 6 pm, M-F.  Also accepts Medicaid/Medicare and self-pay.  [facility] for [place], [address], [place] Phone: [phone], Fax: [phone]. Hours of Operation:  8:30 am - 5:30 pm, M-F.  Also accepts Medicaid and self-pay.  [facility] [address], [place] Phone: [phone]   [place], [place], [place] [phone], Ext. [phone] Mondays & Thursdays: 7-9 AM.  First 15 patients are seen on a first come, first serve basis.    [place] Providers:  Organization         Address  Phone   Notes  [facility] [address], [place] [phone] Also accepts self-pay patients.  [facility] [address], [place]  [phone]   [place], [address], [place]  [phone]   [facility] [address], [place] [phone]   Lucianne Lei [address], [place]   [phone] Only accepts [place] Access [place] patients after they have their name applied to their card.   Self-Pay (no insurance) in [facility]:  Organization         Address  Phone   Notes  Sickle Cell Patients, [facility] [place] [phone]   [facility] [place] [phone]   [facility] [place]  [place], [place], [place] [phone]   [facility]/Dr. Osei-Bonsu  [address], [place] or [address], [place] [phone] Phone number for both [place] and [place] locations is the same.  [facility] [address], [place] [phone]   [facility] [address], [place] or [address] [phone]   [facility] [address] [place] [phone], phone; [phone], fax Sees patients 1st and 3rd Saturday of every month.  Must not qualify for public or private insurance (i.e. Medicaid, Medicare, [place] Health Choice, Veterans' Benefits)  Household income should be no more than 200% of the poverty level The clinic cannot treat you if you are pregnant or think you are pregnant  Sexually transmitted diseases are not treated at the clinic.    Dental Care: Organization         Address  Phone  Notes  [facility] Department of [facility] [phone] Accepts children up to age 49 who are enrolled in [place] or [place]; pregnant women with a Medicaid card; and children who have applied for Medicaid or [place] Health Choice, but were declined, whose parents can pay a reduced fee at time of service.  [facility]  Department of [facility]  [address], [place] [phone] Accepts children up to age 50 who are enrolled in [place] or [place]; pregnant women with a Medicaid card; and children who have applied for Medicaid or [place] Health Choice, but were declined, whose parents can pay a reduced fee at time of service.  [place] Adult [facility] Access PROGRAM  [place] [phone] Patients are seen by appointment only. Walk-ins are not accepted. [place] will see patients 58 years of age and older. Monday - Tuesday (8am-5pm) Most Wednesdays (8:30-5pm) $30 per visit, cash only  [facility] Adult Dental Access PROGRAM  [address], [facility] [phone] Patients are seen by appointment only. Walk-ins are not accepted. [place] will see patients 4 years of age and older. One  Wednesday Evening (Monthly: Volunteer Based).  $30 per visit, cash only  [place]  [phone] for adults; Children under age 1, call Graduate Pediatric Dentistry at [phone]. Children aged 34-14, please call [phone] to request a pediatric application.  Dental services are provided in all areas of dental care including fillings, crowns and bridges, complete and partial dentures, implants, gum treatment, root canals, and extractions. Preventive care is also provided. Treatment is provided to both adults and children. Patients are selected via a lottery and there is often a waiting list.   [facility] [address] [place]  [phone] [email or web address]   [facility] [address] [place], [place] [phone], Ext. [phone] Second and Fourth Thursday of each month, opens at 6:30 AM; Clinic ends at 9 AM.  Patients are seen on a first-come first-served basis, and a limited number are seen during each clinic.   [facility]  [address] Hillard Danker [place], [place] [phone]    Eligibility Requirements You must have lived in [place], [place], or [place] counties for at least the last three months.   You cannot be eligible for state or federal sponsored [employer], including [employer], [place], or [employer].   You generally cannot be eligible for healthcare insurance through your employer.    How to apply: Eligibility screenings are held every Tuesday and Wednesday afternoon from 1:00 pm until 4:00 pm. You do not need an appointment for the interview!  [facility] [address] [place], [place]   [place]  [place] Department  [place]  [phone]    Behavioral Health Resources in the Community: Intensive Outpatient Programs Organization         Address  Phone  Notes  [place]. [address] [place], [place] [phone]   [facility] [address], [place], [place]   ADS: Alcohol [facility] Drug [facility] [address] [place], [place]   [place] [address]  [place], [place] or [phone]   Substance Abuse Resources Organization         Address  Phone  Notes  Alcohol and Drug Services  [phone]   [place]  [phone]   [facility] [place]   Chinita Pester  [phone]   Residential & Outpatient Substance Abuse Program  [phone]   Psychological Services Organization         Address  Phone  Notes  [facility]  [place]  [phone]   [place] [address], [place] or [phone]    Mobile Crisis Teams Organization         Address  Phone  Notes  [facility], Mobile Crisis Care Unit  [phone]   [facility]  [address]  [place], [place]   [place] [address] [place] [phone]    Self-Help/Support Groups Organization         Address  Phone             Notes  [place]. of [place] - variety of support groups  [place]ecilia Call for more information  Narcotics Anonymous (NA), [facility] [address], [employer] [place]  2 meetings at this location  Residential Treatment Programs Organization         Address  Phone  Notes  [facility] [address],    [place]  [phone]   [facility]  [address] [place] [phone], [place], [place]   [employer], [place] [phone] Admissions: 8am-3pm M-F  Incentives Substance [place] [address]    [place], [place] [phone]   [facility] [address] [place], [place], [place]   [facility] [address],  [place], [place]   [facility] - Intensive Outpatient [address] Kristeen [address], [place]   [facility].,  [place], [place] [phone] or [phone]   [facility]idential Treatment Services (RTS) [address] [place], [place] Accepts Medicaid  [facility] [address],  [place] [phone] Substance Abuse/Addiction Treatment   [facility] Organization         Address  Phone  Notes  [facility]  [phone]   Domenic Schwab, PhD [address] Arlis Port[place], [place]   [phone] or [phone]   [place], [place] [phone]   [facility] [address], [place], [place] [phone] Insurance/Medicaid/sponsorship through [facility] and Families [address], [address]                                    [place], [place] [phone] [place] [address], [place] [phone]    Dr. Adele Schilder  [phone]   [facility] Dept. 1) [address], [place] 2) [place] 3)  [place] [address], [place] [phone]  [phone]   [place] [phone] or [phone] (After Hours)       Take the prescription as directed. Continue to take your antibiotic as previously directed. Use your albuterol inhaler every 4 hours for the next 7 days, then as needed for cough, wheezing, or shortness of breath. Use your incentive spirometer: perform 3 deep inhalations every 1 hour while you are awake for the next 1 to 2 weeks.  Call your regular medical doctor Monday morning to schedule a follow up appointment within the next 2 days.  Return to the Emergency Department immediately sooner if worsening.

## 2014-10-10 ENCOUNTER — Encounter: Payer: Self-pay | Admitting: Women's Health

## 2014-11-26 ENCOUNTER — Ambulatory Visit (HOSPITAL_COMMUNITY)
Admission: RE | Admit: 2014-11-26 | Discharge: 2014-11-26 | Disposition: A | Discharge status: Home / Self Care | Payer: Managed Care, Other (non HMO) | Source: Ambulatory Visit | Attending: Pulmonary Disease | Admitting: Pulmonary Disease

## 2014-11-26 ENCOUNTER — Other Ambulatory Visit (HOSPITAL_COMMUNITY): Payer: Self-pay | Admitting: Pulmonary Disease

## 2014-11-26 DIAGNOSIS — M5136 Other intervertebral disc degeneration, lumbar region: Secondary | ICD-10-CM | POA: Insufficient documentation

## 2014-11-26 DIAGNOSIS — M545 Low back pain: Secondary | ICD-10-CM

## 2014-11-26 DIAGNOSIS — M79604 Pain in right leg: Secondary | ICD-10-CM | POA: Diagnosis not present

## 2014-11-26 DIAGNOSIS — M79605 Pain in left leg: Secondary | ICD-10-CM | POA: Diagnosis not present

## 2014-12-13 ENCOUNTER — Other Ambulatory Visit (HOSPITAL_COMMUNITY): Payer: Self-pay | Admitting: Pulmonary Disease

## 2014-12-13 DIAGNOSIS — M545 Low back pain: Secondary | ICD-10-CM

## 2014-12-20 ENCOUNTER — Ambulatory Visit (HOSPITAL_COMMUNITY)
Admission: RE | Admit: 2014-12-20 | Discharge: 2014-12-20 | Disposition: A | Discharge status: Home / Self Care | Payer: Managed Care, Other (non HMO) | Source: Ambulatory Visit | Attending: Pulmonary Disease | Admitting: Pulmonary Disease

## 2014-12-20 DIAGNOSIS — M5126 Other intervertebral disc displacement, lumbar region: Secondary | ICD-10-CM | POA: Insufficient documentation

## 2014-12-20 DIAGNOSIS — M545 Low back pain: Secondary | ICD-10-CM

## 2014-12-20 DIAGNOSIS — R200 Anesthesia of skin: Secondary | ICD-10-CM | POA: Insufficient documentation

## 2014-12-20 DIAGNOSIS — M5136 Other intervertebral disc degeneration, lumbar region: Secondary | ICD-10-CM | POA: Diagnosis not present

## 2015-04-22 ENCOUNTER — Other Ambulatory Visit (HOSPITAL_COMMUNITY): Payer: Self-pay | Admitting: Pulmonary Disease

## 2015-04-22 DIAGNOSIS — R4781 Slurred speech: Secondary | ICD-10-CM

## 2015-05-02 ENCOUNTER — Ambulatory Visit (HOSPITAL_COMMUNITY): Payer: Managed Care, Other (non HMO)

## 2015-05-08 ENCOUNTER — Ambulatory Visit (HOSPITAL_COMMUNITY): Payer: Managed Care, Other (non HMO)

## 2015-05-13 ENCOUNTER — Other Ambulatory Visit (HOSPITAL_COMMUNITY): Payer: Managed Care, Other (non HMO)

## 2015-07-04 ENCOUNTER — Ambulatory Visit (HOSPITAL_COMMUNITY): Payer: Self-pay | Admitting: Psychiatry

## 2016-06-10 ENCOUNTER — Other Ambulatory Visit (HOSPITAL_COMMUNITY): Payer: Self-pay | Admitting: Pulmonary Disease

## 2016-06-10 DIAGNOSIS — B1920 Unspecified viral hepatitis C without hepatic coma: Secondary | ICD-10-CM

## 2016-06-22 ENCOUNTER — Emergency Department (HOSPITAL_COMMUNITY)
Admission: EM | Admit: 2016-06-22 | Discharge: 2016-06-22 | Disposition: A | Discharge status: Home / Self Care | Payer: Self-pay | Attending: Emergency Medicine | Admitting: Emergency Medicine

## 2016-06-22 ENCOUNTER — Encounter (HOSPITAL_COMMUNITY): Payer: Self-pay | Admitting: Emergency Medicine

## 2016-06-22 ENCOUNTER — Emergency Department (HOSPITAL_COMMUNITY): Payer: Self-pay

## 2016-06-22 DIAGNOSIS — R1030 Lower abdominal pain, unspecified: Secondary | ICD-10-CM

## 2016-06-22 DIAGNOSIS — F17210 Nicotine dependence, cigarettes, uncomplicated: Secondary | ICD-10-CM | POA: Insufficient documentation

## 2016-06-22 DIAGNOSIS — Z79899 Other long term (current) drug therapy: Secondary | ICD-10-CM | POA: Insufficient documentation

## 2016-06-22 DIAGNOSIS — Z7984 Long term (current) use of oral hypoglycemic drugs: Secondary | ICD-10-CM | POA: Insufficient documentation

## 2016-06-22 DIAGNOSIS — E119 Type 2 diabetes mellitus without complications: Secondary | ICD-10-CM | POA: Insufficient documentation

## 2016-06-22 DIAGNOSIS — R1032 Left lower quadrant pain: Secondary | ICD-10-CM | POA: Insufficient documentation

## 2016-06-22 HISTORY — DX: Type 2 diabetes mellitus without complications: E11.9

## 2016-06-22 LAB — COMPREHENSIVE METABOLIC PANEL
ALBUMIN: 3.8 g/dL (ref 3.5–5.0)
ALK PHOS: 81 U/L (ref 38–126)
ALT: 65 U/L — ABNORMAL HIGH (ref 14–54)
ANION GAP: 6 (ref 5–15)
AST: 68 U/L — ABNORMAL HIGH (ref 15–41)
BILIRUBIN TOTAL: 1.1 mg/dL (ref 0.3–1.2)
BUN: 6 mg/dL (ref 6–20)
CALCIUM: 8.6 mg/dL — AB (ref 8.9–10.3)
CO2: 25 mmol/L (ref 22–32)
Chloride: 104 mmol/L (ref 101–111)
Creatinine, Ser: 0.52 mg/dL (ref 0.44–1.00)
GLUCOSE: 104 mg/dL — AB (ref 65–99)
POTASSIUM: 3.8 mmol/L (ref 3.5–5.1)
Sodium: 135 mmol/L (ref 135–145)
TOTAL PROTEIN: 7.6 g/dL (ref 6.5–8.1)

## 2016-06-22 LAB — URINALYSIS, ROUTINE W REFLEX MICROSCOPIC
Bilirubin Urine: NEGATIVE
GLUCOSE, UA: NEGATIVE mg/dL
Hgb urine dipstick: NEGATIVE
Ketones, ur: NEGATIVE mg/dL
LEUKOCYTES UA: NEGATIVE
NITRITE: NEGATIVE
PH: 7 (ref 5.0–8.0)
PROTEIN: NEGATIVE mg/dL
Specific Gravity, Urine: 1.01 (ref 1.005–1.030)

## 2016-06-22 LAB — CBC
HEMATOCRIT: 40.9 % (ref 36.0–46.0)
Hemoglobin: 13.9 g/dL (ref 12.0–15.0)
MCH: 29.8 pg (ref 26.0–34.0)
MCHC: 34 g/dL (ref 30.0–36.0)
MCV: 87.6 fL (ref 78.0–100.0)
Platelets: 78 10*3/uL — ABNORMAL LOW (ref 150–400)
RBC: 4.67 MIL/uL (ref 3.87–5.11)
RDW: 13.1 % (ref 11.5–15.5)
WBC: 3.8 10*3/uL — AB (ref 4.0–10.5)

## 2016-06-22 LAB — LIPASE, BLOOD: Lipase: 14 U/L (ref 11–51)

## 2016-06-22 LAB — PREGNANCY, URINE: Preg Test, Ur: NEGATIVE

## 2016-06-22 MED ORDER — HYDROMORPHONE HCL 1 MG/ML IJ SOLN
0.5000 mg | Freq: Once | INTRAMUSCULAR | Status: AC
  Administered 2016-06-22: 0.5 mg via INTRAVENOUS
  Filled 2016-06-22: qty 1

## 2016-06-22 MED ORDER — BARIUM SULFATE 2.1 % PO SUSP
ORAL | Status: AC
  Filled 2016-06-22: qty 2

## 2016-06-22 MED ORDER — SODIUM CHLORIDE 0.9 % IV BOLUS (SEPSIS)
1000.0000 mL | Freq: Once | INTRAVENOUS | Status: AC
  Administered 2016-06-22: 1000 mL via INTRAVENOUS

## 2016-06-22 MED ORDER — HYDROMORPHONE HCL 1 MG/ML IJ SOLN
1.0000 mg | Freq: Once | INTRAMUSCULAR | Status: AC
  Administered 2016-06-22: 1 mg via INTRAVENOUS
  Filled 2016-06-22: qty 1

## 2016-06-22 MED ORDER — ONDANSETRON HCL 4 MG/2ML IJ SOLN
4.0000 mg | Freq: Once | INTRAMUSCULAR | Status: AC
  Administered 2016-06-22: 4 mg via INTRAVENOUS
  Filled 2016-06-22: qty 2

## 2016-06-22 MED ORDER — HYDROCODONE-ACETAMINOPHEN 5-325 MG PO TABS: 1.0000 | ORAL_TABLET | Freq: Four times a day (QID) | ORAL | 0 refills | Status: DC | PRN

## 2016-06-22 NOTE — ED Triage Notes (Signed)
Patient complaining of right lower quadrant abdominal pain and vomiting x 2 since yesterday. Also complaining of urinary frequency x 3 days.

## 2016-06-22 NOTE — ED Provider Notes (Signed)
[facility] DEPT Provider Note   CSN: [id number] Arrival date & time: 06/22/16  1302     History   Chief Complaint Chief Complaint  Patient presents with  . Abdominal Pain    HPI NIKETA TURNER is a 30 y.o. female.  Patient complains of lower abdominal pain and some nausea.   The history is provided by the patient. No language interpreter was used.  Abdominal Pain   This is a new problem. The current episode started more than 2 days ago. The problem occurs constantly. The problem has not changed since onset.The pain is associated with an unknown factor. The pain is located in the suprapubic region. The pain is at a severity of 5/10. The pain is moderate. Pertinent negatives include anorexia, diarrhea, frequency, hematuria and headaches. Nothing aggravates the symptoms. Nothing relieves the symptoms. Past workup does not include GI consult. Her past medical history does not include PUD.    Past Medical History:  Diagnosis Date  . Chronic abdominal pain   . Chronic back pain   . Diabetes mellitus without complication ([place])   . Hepatitis C   . Insomnia   . Migraine headache   . Peptic ulcer     There are no active problems to display for this patient.   Past Surgical History:  Procedure Laterality Date  . CHOLECYSTECTOMY      OB History    Gravida Para Term Preterm AB Living             0   SAB TAB Ectopic Multiple Live Births                   Home Medications    Prior to Admission medications   Medication Sig Start Date End Date Taking? Authorizing Provider  clonazePAM (KLONOPIN) 1 MG tablet Take 1 mg by mouth 4 (four) times daily.    Yes Historical Provider, MD  etonogestrel (NEXPLANON) 68 MG IMPL implant 1 each by Subdermal route once.   Yes Historical Provider, MD  metFORMIN (GLUCOPHAGE-XR) 500 MG 24 hr tablet Take 1,000 mg by mouth 2 (two) times daily.   Yes Historical Provider, MD  methadone (DOLOPHINE) 10 MG tablet Take 20 mg by mouth at bedtime.     Yes Historical Provider, MD  vortioxetine HBr (TRINTELLIX) 10 MG TABS Take 10 mg by mouth every evening.   Yes Historical Provider, MD  HYDROcodone-acetaminophen (NORCO/VICODIN) 5-325 MG tablet Take 1 tablet by mouth every 6 (six) hours as needed for moderate pain. 06/22/16   Milton Ferguson, MD    Family History Family History  Problem Relation Age of Onset  . Hypertension Mother   . Hyperlipidemia Other     Social History Social History  Substance Use Topics  . Smoking status: Current Every Day Smoker    Packs/day: 0.00    Years: 5.00    Types: E-cigarettes  . Smokeless tobacco: Never Used  . Alcohol use No     Allergies   Ketorolac tromethamine; Ivp dye [iodinated diagnostic agents]; and Nsaids   Review of Systems Review of Systems  Constitutional: Negative for appetite change and fatigue.  HENT: Negative for congestion, ear discharge and sinus pressure.   Eyes: Negative for discharge.  Respiratory: Negative for cough.   Cardiovascular: Negative for chest pain.  Gastrointestinal: Positive for abdominal pain. Negative for anorexia and diarrhea.  Genitourinary: Negative for frequency and hematuria.  Musculoskeletal: Negative for back pain.  Skin: Negative for rash.  Neurological: Negative for seizures  and headaches.  Psychiatric/Behavioral: Negative for hallucinations.     Physical Exam Updated Vital Signs BP 128/78 (BP Location: Left Arm)   Pulse 84   Temp 98.4 F (36.9 C) (Oral)   Resp 20   Ht 5' 9"  (1.753 m)   Wt 265 lb (120.2 kg)   SpO2 98%   BMI 39.13 kg/m   Physical Exam  Constitutional: She is oriented to person, place, and time. She appears well-developed.  HENT:  Head: Normocephalic.  Eyes: Conjunctivae and EOM are normal. No scleral icterus.  Neck: Neck supple. No thyromegaly present.  Cardiovascular: Normal rate and regular rhythm.  Exam reveals no gallop and no friction rub.   No murmur heard. Pulmonary/Chest: No stridor. She has no  wheezes. She has no rales. She exhibits no tenderness.  Abdominal: She exhibits no distension. There is tenderness. There is no rebound.  Moderate tenderness suprapubic and left lower quadrant  Musculoskeletal: Normal range of motion. She exhibits no edema.  Lymphadenopathy:    She has no cervical adenopathy.  Neurological: She is oriented to person, place, and time. She exhibits normal muscle tone. Coordination normal.  Skin: No rash noted. No erythema.  Psychiatric: She has a normal mood and affect. Her behavior is normal.     ED Treatments / Results  Labs (all labs ordered are listed, but only abnormal results are displayed) Labs Reviewed  COMPREHENSIVE METABOLIC PANEL - Abnormal; Notable for the following:       Result Value   Glucose, Bld 104 (*)    Calcium 8.6 (*)    AST 68 (*)    ALT 65 (*)    All other components within normal limits  CBC - Abnormal; Notable for the following:    WBC 3.8 (*)    Platelets 78 (*)    All other components within normal limits  LIPASE, BLOOD  URINALYSIS, ROUTINE W REFLEX MICROSCOPIC (NOT AT [facility])  PREGNANCY, URINE    EKG  EKG Interpretation None       Radiology Ct Abdomen Pelvis Wo Contrast  Result Date: 06/22/2016 CLINICAL DATA:  Right lower quadrant abdominal pain, nausea and vomiting since yesterday. EXAM: CT ABDOMEN AND PELVIS WITHOUT CONTRAST TECHNIQUE: Multidetector CT imaging of the abdomen and pelvis was performed following the standard protocol without IV contrast. COMPARISON:  10/07/2013. FINDINGS: Lower chest:  Clear lung bases. Hepatobiliary: Diffuse low density of the liver relative to the spleen. Cholecystectomy clips. Pancreas: Diffuse atrophy with partial fatty replacement. Spleen: Enlarged, measuring 2.2 cm in length. Adrenals/Urinary Tract: Normal appearing adrenal glands, kidneys, ureters and urinary bladder. No calculi or hydronephrosis. No visible mass. Stomach/Bowel: Prominent stool in the descending and rectosigmoid  colon. Unremarkable stomach and small bowel. Normal appearing appendix. Vascular/Lymphatic: The previously demonstrated 21 mm short axis portacaval node has a short axis diameter of 13 mm today on image number 32. A gastrohepatic ligament lymph node currently has a short axis diameter of 21 mm on image number 28, previously 20 mm. No vascular calcifications are aneurysm. Reproductive: No mass or other significant abnormality. Other: Tiny umbilical and supraumbilical hernias containing fat. Musculoskeletal: Lumbar and lower thoracic spine degenerative changes. IMPRESSION: 1. Progressive splenomegaly. 2. Mild diffuse hepatic steatosis. 3. Mildly improved upper abdominal adenopathy. Electronically Signed   By: Claudie Revering M.D.   On: 06/22/2016 19:24    Procedures Procedures (including critical care time)  Medications Ordered in ED Medications  Barium Sulfate 2.1 % SUSP (not administered)  sodium chloride 0.9 % bolus 1,000 mL (  0 mLs Intravenous Stopped 06/22/16 1815)  HYDROmorphone (DILAUDID) injection 1 mg (1 mg Intravenous Given 06/22/16 1713)  ondansetron (ZOFRAN) injection 4 mg (4 mg Intravenous Given 06/22/16 1709)  HYDROmorphone (DILAUDID) injection 0.5 mg (0.5 mg Intravenous Given 06/22/16 2026)     Initial Impression / Assessment and Plan / ED Course  I have reviewed the triage vital signs and the nursing notes.  Pertinent labs & imaging results that were available during my care of the patient were reviewed by me and considered in my medical decision making (see chart for details).  Clinical Course    CT scan shows fatty liver and enlarged spleen. Labs unremarkable. Except for mild elevation of liver studies. Patient sent home with hydrocodone and will follow-up with her PCP this week  Final Clinical Impressions(s) / ED Diagnoses   Final diagnoses:  Lower abdominal pain    New Prescriptions New Prescriptions   HYDROCODONE-ACETAMINOPHEN (NORCO/VICODIN) 5-325 MG TABLET    Take 1  tablet by mouth every 6 (six) hours as needed for moderate pain.     Milton Ferguson, MD 06/22/16 2157

## 2016-06-22 NOTE — ED Notes (Signed)
Pt to CT

## 2016-06-22 NOTE — ED Notes (Signed)
Pt refused prescription for hydrocodone due to an agreement w/ Dr Luan Pulling.

## 2016-06-22 NOTE — Discharge Instructions (Signed)
Follow-up with your family doctor this week for recheck 

## 2016-06-22 NOTE — ED Notes (Signed)
Pt alert & oriented x4, stable gait. Patient given discharge instructions, paperwork & prescription(s). Patient  instructed to stop at the registration desk to finish any additional paperwork. Patient verbalized understanding. Pt left department w/ no further questions. 

## 2016-06-24 ENCOUNTER — Other Ambulatory Visit (HOSPITAL_COMMUNITY): Payer: Self-pay | Admitting: Pulmonary Disease

## 2016-06-25 ENCOUNTER — Encounter (HOSPITAL_COMMUNITY): Payer: Self-pay | Admitting: Emergency Medicine

## 2016-06-25 ENCOUNTER — Emergency Department (HOSPITAL_COMMUNITY): Payer: Self-pay

## 2016-06-25 ENCOUNTER — Emergency Department (HOSPITAL_COMMUNITY)
Admission: EM | Admit: 2016-06-25 | Discharge: 2016-06-25 | Disposition: A | Discharge status: Home / Self Care | Payer: Self-pay | Attending: Emergency Medicine | Admitting: Emergency Medicine

## 2016-06-25 DIAGNOSIS — W1789XA Other fall from one level to another, initial encounter: Secondary | ICD-10-CM | POA: Insufficient documentation

## 2016-06-25 DIAGNOSIS — Y999 Unspecified external cause status: Secondary | ICD-10-CM | POA: Insufficient documentation

## 2016-06-25 DIAGNOSIS — Z79899 Other long term (current) drug therapy: Secondary | ICD-10-CM | POA: Insufficient documentation

## 2016-06-25 DIAGNOSIS — Y929 Unspecified place or not applicable: Secondary | ICD-10-CM | POA: Insufficient documentation

## 2016-06-25 DIAGNOSIS — E119 Type 2 diabetes mellitus without complications: Secondary | ICD-10-CM | POA: Insufficient documentation

## 2016-06-25 DIAGNOSIS — S2241XA Multiple fractures of ribs, right side, initial encounter for closed fracture: Secondary | ICD-10-CM | POA: Insufficient documentation

## 2016-06-25 DIAGNOSIS — F17210 Nicotine dependence, cigarettes, uncomplicated: Secondary | ICD-10-CM | POA: Insufficient documentation

## 2016-06-25 DIAGNOSIS — Z7984 Long term (current) use of oral hypoglycemic drugs: Secondary | ICD-10-CM | POA: Insufficient documentation

## 2016-06-25 DIAGNOSIS — Y939 Activity, unspecified: Secondary | ICD-10-CM | POA: Insufficient documentation

## 2016-06-25 MED ORDER — DIAZEPAM 5 MG PO TABS
5.0000 mg | ORAL_TABLET | Freq: Once | ORAL | Status: AC
  Administered 2016-06-25: 5 mg via ORAL
  Filled 2016-06-25: qty 1

## 2016-06-25 MED ORDER — IBUPROFEN 400 MG PO TABS
400.0000 mg | ORAL_TABLET | Freq: Once | ORAL | Status: AC
  Administered 2016-06-25: 400 mg via ORAL
  Filled 2016-06-25: qty 1

## 2016-06-25 MED ORDER — HYDROMORPHONE HCL 1 MG/ML IJ SOLN
1.0000 mg | Freq: Once | INTRAMUSCULAR | Status: AC
  Administered 2016-06-25: 1 mg via INTRAMUSCULAR
  Filled 2016-06-25: qty 1

## 2016-06-25 NOTE — ED Provider Notes (Signed)
[facility] DEPT Provider Note   CSN: [id number] Arrival date & time: 06/25/16  1848     History   Chief Complaint Chief Complaint  Patient presents with  . Fall    HPI Jaime [name] is a 30 y.o. female.  HPI   30 year old female with right-sided chest pain after fall from horse. She was not wearing a helmet, but she does not feel like she her head. Denies any significant headache or neck pain. She has been ambulatory since the fall. Her chest pain is worse with any type of movement and deep breathing.  Past Medical History:  Diagnosis Date  . Chronic abdominal pain   . Chronic back pain   . Diabetes mellitus without complication ([place])   . Hepatitis C   . Insomnia   . Migraine headache   . Peptic ulcer     There are no active problems to display for this patient.   Past Surgical History:  Procedure Laterality Date  . CHOLECYSTECTOMY      OB History    Gravida Para Term Preterm AB Living             0   SAB TAB Ectopic Multiple Live Births                   Home Medications    Prior to Admission medications   Medication Sig Start Date End Date Taking? Authorizing Provider  clonazePAM (KLONOPIN) 1 MG tablet Take 1 mg by mouth 4 (four) times daily.    Yes Historical Provider, MD  metFORMIN (GLUCOPHAGE-XR) 500 MG 24 hr tablet Take 1,000 mg by mouth 2 (two) times daily.   Yes Historical Provider, MD  methadone (DOLOPHINE) 10 MG tablet Take 20 mg by mouth at bedtime.    Yes Historical Provider, MD  Suvorexant (BELSOMRA) 15 MG TABS Take 15 mg by mouth at bedtime.   Yes Historical Provider, MD  vortioxetine HBr (TRINTELLIX) 10 MG TABS Take 10 mg by mouth every evening.   Yes Historical Provider, MD  etonogestrel (NEXPLANON) 68 MG IMPL implant 1 each by Subdermal route once.    Historical Provider, MD  HYDROcodone-acetaminophen (NORCO/VICODIN) 5-325 MG tablet Take 1 tablet by mouth every 6 (six) hours as needed for moderate pain. Patient not taking: Reported  on 06/25/2016 06/22/16   Milton Ferguson, MD    Family History Family History  Problem Relation Age of Onset  . Hypertension Mother   . Hyperlipidemia Other     Social History Social History  Substance Use Topics  . Smoking status: Current Every Day Smoker    Packs/day: 0.00    Years: 5.00    Types: E-cigarettes  . Smokeless tobacco: Never Used  . Alcohol use No     Allergies   Ketorolac tromethamine; Ivp dye [iodinated diagnostic agents]; and Nsaids   Review of Systems Review of Systems  All systems reviewed and negative, other than as noted in HPI.  Physical Exam Updated Vital Signs BP 136/78 (BP Location: Left Arm)   Pulse 111   Temp 98.2 F (36.8 C) (Oral)   Resp 20   Ht 5' 9"  (1.753 m)   Wt 273 lb (123.8 kg)   SpO2 100%   BMI 40.32 kg/m   Physical Exam  Constitutional: She appears well-developed and well-nourished. No distress.  HENT:  Head: Normocephalic and atraumatic.  Eyes: Conjunctivae are normal. Right eye exhibits no discharge. Left eye exhibits no discharge.  Neck: Neck supple.  Cardiovascular: Normal  rate, regular rhythm and normal heart sounds.  Exam reveals no gallop and no friction rub.   No murmur heard. Pulmonary/Chest: Effort normal and breath sounds normal. No respiratory distress. She exhibits tenderness.  Exquisite right lateral chest wall tenderness. No concerning skin changes. No crepitus. Breath sounds are clear bilaterally and symmetric.  Abdominal: Soft. She exhibits no distension. There is no tenderness.  Musculoskeletal: She exhibits no edema or tenderness.  No midline spinal tenderness  Neurological: She is alert.  Skin: Skin is warm and dry.  Psychiatric: She has a normal mood and affect. Her behavior is normal. Thought content normal.  Nursing note and vitals reviewed.    ED Treatments / Results  Labs (all labs ordered are listed, but only abnormal results are displayed) Labs Reviewed - No data to display  EKG  EKG  Interpretation None       Radiology Dg Ribs Unilateral W/chest Right  Result Date: 06/25/2016 CLINICAL DATA:  Right lateral rib pain and back pain after fall. EXAM: RIGHT RIBS AND CHEST - 3+ VIEW COMPARISON:  August 03, 2014 FINDINGS: Low lung volumes limit evaluation of the lungs. No pneumothorax identified. The heart, hila, and mediastinum are unremarkable. No pulmonary nodules, masses, or focal infiltrates. There is a nondisplaced fracture through the lateral right eighth rib. Fractures are also seen through the posterior lateral sixth and seventh ribs on the right. IMPRESSION: Nondisplaced fractures through the right lateral sixth, seventh, and eighth ribs with no pneumothorax. Electronically Signed   By: Dorise Bullion III M.D   On: 06/25/2016 20:31   Dg Lumbar Spine Complete  Result Date: 06/25/2016 CLINICAL DATA:  Right lateral rib pain and lower back pain today following fall off of horse today. Pt stated she fell off of her horse today on to the ground No previous injuries EXAM: LUMBAR SPINE - COMPLETE 4+ VIEW COMPARISON:  CT, 06/22/2016 FINDINGS: No fracture or spondylolisthesis. Mild loss of disc height from L2-L3 through L4-L5. Small endplate osteophytes most evident at L2-L3. Facet joints are well preserved. Soft tissues are unremarkable. IMPRESSION: 1. No fracture or acute finding. 2. Mild degenerative changes. Electronically Signed   By: Lajean Manes M.D.   On: 06/25/2016 20:32    Procedures Procedures (including critical care time)  Medications Ordered in ED Medications  HYDROmorphone (DILAUDID) injection 1 mg (1 mg Intramuscular Given 06/25/16 1936)  diazepam (VALIUM) tablet 5 mg (5 mg Oral Given 06/25/16 1935)  HYDROmorphone (DILAUDID) injection 1 mg (1 mg Intramuscular Given 06/25/16 2105)  ibuprofen (ADVIL,MOTRIN) tablet 400 mg (400 mg Oral Given 06/25/16 2105)     Initial Impression / Assessment and Plan / ED Course  I have reviewed the triage vital signs and the  nursing notes.  Pertinent labs & imaging results that were available during my care of the patient were reviewed by me and considered in my medical decision making (see chart for details).  Clinical Course    30yF with R chest/flank and lower back pain after fall from horse. R sided rib fxs. Low suspicion for serious intraabdominal injury. Lumbar films ok. Nonfocal neuro exam. She has pain medication agreement with Dr Luan Pulling. Allergies noted. Appropriate use of NSAIDs for a couple days shouldn't be a significant issue, but will leave that up to her. Further pain management per Dr Luan Pulling. It has been determined that no acute conditions requiring further emergency intervention are present at this time. The patient has been advised of the diagnosis and plan. I reviewed any labs and  imaging including any potential incidental findings. We have discussed signs and symptoms that warrant return to the ED and they are listed in the discharge instructions.    Final Clinical Impressions(s) / ED Diagnoses   Final diagnoses:  Multiple rib fractures, right, closed, initial encounter    New Prescriptions New Prescriptions   No medications on file     Virgel Manifold, MD 07/11/16 1702

## 2016-06-25 NOTE — ED Triage Notes (Signed)
Pt states she fell off her horse approximately 54mns ago. C/o pain to R side and lower back.

## 2016-06-30 ENCOUNTER — Ambulatory Visit (HOSPITAL_COMMUNITY): Payer: Self-pay

## 2016-07-09 ENCOUNTER — Other Ambulatory Visit (HOSPITAL_COMMUNITY): Payer: Self-pay | Admitting: Pulmonary Disease

## 2016-07-09 DIAGNOSIS — M7989 Other specified soft tissue disorders: Secondary | ICD-10-CM

## 2016-07-10 ENCOUNTER — Ambulatory Visit (HOSPITAL_COMMUNITY)
Admission: RE | Admit: 2016-07-10 | Discharge: 2016-07-10 | Disposition: A | Discharge status: Home / Self Care | Payer: Self-pay | Source: Ambulatory Visit | Attending: Pulmonary Disease | Admitting: Pulmonary Disease

## 2016-07-10 DIAGNOSIS — M7989 Other specified soft tissue disorders: Secondary | ICD-10-CM | POA: Insufficient documentation

## 2016-07-17 ENCOUNTER — Inpatient Hospital Stay (HOSPITAL_COMMUNITY): Payer: Self-pay

## 2016-07-17 ENCOUNTER — Inpatient Hospital Stay (HOSPITAL_COMMUNITY)
Admission: EM | Admit: 2016-07-17 | Discharge: 2016-07-20 | DRG: 871 | Disposition: A | Discharge status: Home / Self Care | Payer: Self-pay | Attending: Pulmonary Disease | Admitting: Pulmonary Disease

## 2016-07-17 ENCOUNTER — Emergency Department (HOSPITAL_COMMUNITY): Payer: Self-pay

## 2016-07-17 ENCOUNTER — Observation Stay (HOSPITAL_COMMUNITY): Payer: Self-pay

## 2016-07-17 ENCOUNTER — Encounter (HOSPITAL_COMMUNITY): Payer: Self-pay | Admitting: *Deleted

## 2016-07-17 DIAGNOSIS — B1920 Unspecified viral hepatitis C without hepatic coma: Secondary | ICD-10-CM | POA: Diagnosis present

## 2016-07-17 DIAGNOSIS — Z7984 Long term (current) use of oral hypoglycemic drugs: Secondary | ICD-10-CM

## 2016-07-17 DIAGNOSIS — M545 Low back pain: Secondary | ICD-10-CM | POA: Diagnosis present

## 2016-07-17 DIAGNOSIS — Z8711 Personal history of peptic ulcer disease: Secondary | ICD-10-CM

## 2016-07-17 DIAGNOSIS — F1120 Opioid dependence, uncomplicated: Secondary | ICD-10-CM | POA: Diagnosis present

## 2016-07-17 DIAGNOSIS — Z6841 Body Mass Index (BMI) 40.0 and over, adult: Secondary | ICD-10-CM

## 2016-07-17 DIAGNOSIS — J90 Pleural effusion, not elsewhere classified: Secondary | ICD-10-CM | POA: Diagnosis present

## 2016-07-17 DIAGNOSIS — E119 Type 2 diabetes mellitus without complications: Secondary | ICD-10-CM | POA: Diagnosis present

## 2016-07-17 DIAGNOSIS — S2249XA Multiple fractures of ribs, unspecified side, initial encounter for closed fracture: Secondary | ICD-10-CM | POA: Diagnosis present

## 2016-07-17 DIAGNOSIS — R0902 Hypoxemia: Secondary | ICD-10-CM

## 2016-07-17 DIAGNOSIS — J189 Pneumonia, unspecified organism: Secondary | ICD-10-CM | POA: Diagnosis present

## 2016-07-17 DIAGNOSIS — S2241XD Multiple fractures of ribs, right side, subsequent encounter for fracture with routine healing: Secondary | ICD-10-CM

## 2016-07-17 DIAGNOSIS — F17290 Nicotine dependence, other tobacco product, uncomplicated: Secondary | ICD-10-CM | POA: Diagnosis present

## 2016-07-17 DIAGNOSIS — J9601 Acute respiratory failure with hypoxia: Secondary | ICD-10-CM | POA: Diagnosis present

## 2016-07-17 DIAGNOSIS — G4700 Insomnia, unspecified: Secondary | ICD-10-CM | POA: Diagnosis present

## 2016-07-17 DIAGNOSIS — R791 Abnormal coagulation profile: Secondary | ICD-10-CM | POA: Diagnosis present

## 2016-07-17 DIAGNOSIS — J45909 Unspecified asthma, uncomplicated: Secondary | ICD-10-CM | POA: Diagnosis present

## 2016-07-17 DIAGNOSIS — S271XXA Traumatic hemothorax, initial encounter: Secondary | ICD-10-CM | POA: Diagnosis present

## 2016-07-17 DIAGNOSIS — G8929 Other chronic pain: Secondary | ICD-10-CM | POA: Diagnosis present

## 2016-07-17 DIAGNOSIS — Z8781 Personal history of (healed) traumatic fracture: Secondary | ICD-10-CM

## 2016-07-17 DIAGNOSIS — A419 Sepsis, unspecified organism: Principal | ICD-10-CM | POA: Diagnosis present

## 2016-07-17 DIAGNOSIS — J942 Hemothorax: Secondary | ICD-10-CM | POA: Diagnosis present

## 2016-07-17 DIAGNOSIS — Z8249 Family history of ischemic heart disease and other diseases of the circulatory system: Secondary | ICD-10-CM

## 2016-07-17 DIAGNOSIS — Z9889 Other specified postprocedural states: Secondary | ICD-10-CM

## 2016-07-17 DIAGNOSIS — R0602 Shortness of breath: Secondary | ICD-10-CM

## 2016-07-17 DIAGNOSIS — G894 Chronic pain syndrome: Secondary | ICD-10-CM

## 2016-07-17 HISTORY — DX: Opioid dependence, uncomplicated: F11.20

## 2016-07-17 LAB — URINALYSIS, ROUTINE W REFLEX MICROSCOPIC
Bilirubin Urine: NEGATIVE
Glucose, UA: NEGATIVE mg/dL
HGB URINE DIPSTICK: NEGATIVE
Ketones, ur: NEGATIVE mg/dL
Leukocytes, UA: NEGATIVE
NITRITE: NEGATIVE
Protein, ur: NEGATIVE mg/dL
SPECIFIC GRAVITY, URINE: 1.015 (ref 1.005–1.030)
pH: 8 (ref 5.0–8.0)

## 2016-07-17 LAB — BODY FLUID CELL COUNT WITH DIFFERENTIAL
Eos, Fluid: 0 %
Lymphs, Fluid: 33 %
Monocyte-Macrophage-Serous Fluid: 2 % — ABNORMAL LOW (ref 50–90)
NEUTROPHIL FLUID: 65 % — AB (ref 0–25)
Total Nucleated Cell Count, Fluid: 5448 cu mm — ABNORMAL HIGH (ref 0–1000)

## 2016-07-17 LAB — PROTEIN, BODY FLUID: Total protein, fluid: 4 g/dL

## 2016-07-17 LAB — CBC WITH DIFFERENTIAL/PLATELET
Basophils Absolute: 0 10*3/uL (ref 0.0–0.1)
Basophils Relative: 0 %
EOS ABS: 0.1 10*3/uL (ref 0.0–0.7)
EOS PCT: 2 %
HCT: 38.1 % (ref 36.0–46.0)
Hemoglobin: 12.8 g/dL (ref 12.0–15.0)
Lymphocytes Relative: 27 %
Lymphs Abs: 1.2 10*3/uL (ref 0.7–4.0)
MCH: 29.8 pg (ref 26.0–34.0)
MCHC: 33.6 g/dL (ref 30.0–36.0)
MCV: 88.6 fL (ref 78.0–100.0)
MONOS PCT: 11 %
Monocytes Absolute: 0.5 10*3/uL (ref 0.1–1.0)
Neutro Abs: 2.6 10*3/uL (ref 1.7–7.7)
Neutrophils Relative %: 60 %
PLATELETS: 105 10*3/uL — AB (ref 150–400)
RBC: 4.3 MIL/uL (ref 3.87–5.11)
RDW: 13.7 % (ref 11.5–15.5)
WBC: 4.4 10*3/uL (ref 4.0–10.5)

## 2016-07-17 LAB — COMPREHENSIVE METABOLIC PANEL
ALT: 48 U/L (ref 14–54)
AST: 54 U/L — ABNORMAL HIGH (ref 15–41)
Albumin: 3.6 g/dL (ref 3.5–5.0)
Alkaline Phosphatase: 115 U/L (ref 38–126)
Anion gap: 8 (ref 5–15)
BUN: 5 mg/dL — ABNORMAL LOW (ref 6–20)
CHLORIDE: 102 mmol/L (ref 101–111)
CO2: 25 mmol/L (ref 22–32)
Calcium: 9 mg/dL (ref 8.9–10.3)
Creatinine, Ser: 0.58 mg/dL (ref 0.44–1.00)
Glucose, Bld: 99 mg/dL (ref 65–99)
POTASSIUM: 3.9 mmol/L (ref 3.5–5.1)
SODIUM: 135 mmol/L (ref 135–145)
Total Bilirubin: 1.5 mg/dL — ABNORMAL HIGH (ref 0.3–1.2)
Total Protein: 7.6 g/dL (ref 6.5–8.1)

## 2016-07-17 LAB — GRAM STAIN

## 2016-07-17 LAB — LACTIC ACID, PLASMA
LACTIC ACID, VENOUS: 1.5 mmol/L (ref 0.5–1.9)
LACTIC ACID, VENOUS: 0.9 mmol/L (ref 0.5–1.9)

## 2016-07-17 LAB — GLUCOSE, CAPILLARY: Glucose-Capillary: 103 mg/dL — ABNORMAL HIGH (ref 65–99)

## 2016-07-17 LAB — D-DIMER, QUANTITATIVE (NOT AT ARMC): D DIMER QUANT: 4.53 ug{FEU}/mL — AB (ref 0.00–0.50)

## 2016-07-17 LAB — I-STAT BETA HCG BLOOD, ED (MC, WL, AP ONLY)

## 2016-07-17 LAB — TROPONIN I

## 2016-07-17 LAB — GLUCOSE, SEROUS FLUID: GLUCOSE FL: 105 mg/dL

## 2016-07-17 LAB — LACTATE DEHYDROGENASE, PLEURAL OR PERITONEAL FLUID: LD FL: 139 U/L — AB (ref 3–23)

## 2016-07-17 LAB — MRSA PCR SCREENING: MRSA by PCR: NEGATIVE

## 2016-07-17 LAB — STREP PNEUMONIAE URINARY ANTIGEN: STREP PNEUMO URINARY ANTIGEN: NEGATIVE

## 2016-07-17 MED ORDER — ONDANSETRON HCL 4 MG/2ML IJ SOLN
4.0000 mg | Freq: Once | INTRAMUSCULAR | Status: AC
  Administered 2016-07-17: 4 mg via INTRAVENOUS
  Filled 2016-07-17: qty 2

## 2016-07-17 MED ORDER — VANCOMYCIN HCL 10 G IV SOLR
1250.0000 mg | Freq: Three times a day (TID) | INTRAVENOUS | Status: DC
  Administered 2016-07-17 – 2016-07-20 (×8): 1250 mg via INTRAVENOUS
  Filled 2016-07-17 (×13): qty 1250

## 2016-07-17 MED ORDER — METHADONE HCL 10 MG PO TABS
ORAL_TABLET | ORAL | Status: AC
  Filled 2016-07-17: qty 2

## 2016-07-17 MED ORDER — VANCOMYCIN HCL 10 G IV SOLR
2500.0000 mg | Freq: Once | INTRAVENOUS | Status: AC
  Administered 2016-07-17: 2500 mg via INTRAVENOUS
  Filled 2016-07-17: qty 2500

## 2016-07-17 MED ORDER — IPRATROPIUM-ALBUTEROL 0.5-2.5 (3) MG/3ML IN SOLN
RESPIRATORY_TRACT | Status: AC
  Filled 2016-07-17: qty 6

## 2016-07-17 MED ORDER — SODIUM CHLORIDE 0.9 % IV SOLN
INTRAVENOUS | Status: DC
Start: 2016-07-17 — End: 2016-07-17
  Administered 2016-07-17: 08:00:00 via INTRAVENOUS

## 2016-07-17 MED ORDER — SODIUM CHLORIDE 0.9 % IV BOLUS (SEPSIS)
1000.0000 mL | Freq: Once | INTRAVENOUS | Status: AC
  Administered 2016-07-17: 1000 mL via INTRAVENOUS

## 2016-07-17 MED ORDER — ACETAMINOPHEN 325 MG PO TABS: 650.0000 mg | ORAL_TABLET | Freq: Four times a day (QID) | ORAL | Status: DC | PRN

## 2016-07-17 MED ORDER — FAMOTIDINE 20 MG PO TABS
20.0000 mg | ORAL_TABLET | Freq: Every day | ORAL | Status: DC
  Administered 2016-07-17 – 2016-07-20 (×4): 20 mg via ORAL
  Filled 2016-07-17 (×4): qty 1

## 2016-07-17 MED ORDER — VANCOMYCIN HCL 500 MG IV SOLR
INTRAVENOUS | Status: AC
  Filled 2016-07-17: qty 500

## 2016-07-17 MED ORDER — CLONAZEPAM 0.5 MG PO TABS
1.0000 mg | ORAL_TABLET | Freq: Four times a day (QID) | ORAL | Status: DC
  Administered 2016-07-17 – 2016-07-20 (×12): 1 mg via ORAL
  Filled 2016-07-17 (×12): qty 2

## 2016-07-17 MED ORDER — VANCOMYCIN HCL 1000 MG IV SOLR
INTRAVENOUS | Status: AC
  Filled 2016-07-17: qty 2000

## 2016-07-17 MED ORDER — IPRATROPIUM-ALBUTEROL 0.5-2.5 (3) MG/3ML IN SOLN
6.0000 mL | Freq: Once | RESPIRATORY_TRACT | Status: AC
  Administered 2016-07-17: 6 mL via RESPIRATORY_TRACT

## 2016-07-17 MED ORDER — HYDROCODONE-ACETAMINOPHEN 5-325 MG PO TABS
1.0000 | ORAL_TABLET | Freq: Four times a day (QID) | ORAL | Status: DC | PRN
  Administered 2016-07-17 – 2016-07-18 (×2): 2 via ORAL
  Filled 2016-07-17 (×2): qty 2

## 2016-07-17 MED ORDER — IPRATROPIUM BROMIDE 0.02 % IN SOLN: 1.0000 mg | Freq: Once | RESPIRATORY_TRACT | Status: DC

## 2016-07-17 MED ORDER — SODIUM CHLORIDE 0.9 % IV SOLN
INTRAVENOUS | Status: DC
  Administered 2016-07-17 – 2016-07-19 (×5): via INTRAVENOUS

## 2016-07-17 MED ORDER — INFLUENZA VAC SPLIT QUAD 0.5 ML IM SUSY
0.5000 mL | PREFILLED_SYRINGE | INTRAMUSCULAR | Status: AC
  Administered 2016-07-18: 0.5 mL via INTRAMUSCULAR
  Filled 2016-07-17: qty 0.5

## 2016-07-17 MED ORDER — TECHNETIUM TO 99M ALBUMIN AGGREGATED
3.0000 | Freq: Once | INTRAVENOUS | Status: AC | PRN
  Administered 2016-07-17: 3 via INTRAVENOUS

## 2016-07-17 MED ORDER — ONDANSETRON HCL 4 MG/2ML IJ SOLN
4.0000 mg | Freq: Four times a day (QID) | INTRAMUSCULAR | Status: DC | PRN
  Administered 2016-07-19: 4 mg via INTRAVENOUS
  Filled 2016-07-17: qty 2

## 2016-07-17 MED ORDER — SUVOREXANT 15 MG PO TABS: 15.0000 mg | ORAL_TABLET | Freq: Every day | ORAL | Status: DC

## 2016-07-17 MED ORDER — METHADONE HCL 10 MG PO TABS: 40.0000 mg | ORAL_TABLET | Freq: Every day | ORAL | Status: DC

## 2016-07-17 MED ORDER — HYDROMORPHONE HCL 1 MG/ML IJ SOLN
1.0000 mg | Freq: Once | INTRAMUSCULAR | Status: AC
  Administered 2016-07-17: 1 mg via INTRAVENOUS
  Filled 2016-07-17: qty 1

## 2016-07-17 MED ORDER — DIPHENHYDRAMINE HCL 25 MG PO CAPS
25.0000 mg | ORAL_CAPSULE | Freq: Four times a day (QID) | ORAL | Status: DC | PRN
  Administered 2016-07-17 – 2016-07-20 (×9): 25 mg via ORAL
  Filled 2016-07-17 (×9): qty 1

## 2016-07-17 MED ORDER — METHADONE HCL 10 MG PO TABS
20.0000 mg | ORAL_TABLET | Freq: Two times a day (BID) | ORAL | Status: DC
Start: 2016-07-17 — End: 2016-07-20
  Administered 2016-07-17 – 2016-07-20 (×7): 20 mg via ORAL
  Filled 2016-07-17 (×6): qty 2

## 2016-07-17 MED ORDER — VORTIOXETINE HBR 10 MG PO TABS
10.0000 mg | ORAL_TABLET | Freq: Every evening | ORAL | Status: DC
  Administered 2016-07-18 – 2016-07-19 (×2): 10 mg via ORAL
  Filled 2016-07-17 (×5): qty 1

## 2016-07-17 MED ORDER — PNEUMOCOCCAL VAC POLYVALENT 25 MCG/0.5ML IJ INJ
0.5000 mL | INJECTION | INTRAMUSCULAR | Status: AC
  Administered 2016-07-18: 0.5 mL via INTRAMUSCULAR
  Filled 2016-07-17: qty 0.5

## 2016-07-17 MED ORDER — ACETAMINOPHEN 500 MG PO TABS
1000.0000 mg | ORAL_TABLET | Freq: Once | ORAL | Status: AC
  Administered 2016-07-17: 1000 mg via ORAL
  Filled 2016-07-17: qty 2

## 2016-07-17 MED ORDER — ZOLPIDEM TARTRATE 5 MG PO TABS
5.0000 mg | ORAL_TABLET | Freq: Every day | ORAL | Status: DC
  Administered 2016-07-17 – 2016-07-19 (×3): 5 mg via ORAL
  Filled 2016-07-17 (×3): qty 1

## 2016-07-17 MED ORDER — PIPERACILLIN-TAZOBACTAM 3.375 G IVPB
3.3750 g | Freq: Three times a day (TID) | INTRAVENOUS | Status: DC
  Administered 2016-07-17 – 2016-07-20 (×10): 3.375 g via INTRAVENOUS
  Filled 2016-07-17 (×10): qty 50

## 2016-07-17 MED ORDER — ALBUTEROL SULFATE (2.5 MG/3ML) 0.083% IN NEBU: 5.0000 mg | INHALATION_SOLUTION | Freq: Once | RESPIRATORY_TRACT | Status: DC

## 2016-07-17 MED ORDER — TECHNETIUM TC 99M DIETHYLENETRIAME-PENTAACETIC ACID
29.9000 | Freq: Once | INTRAVENOUS | Status: AC | PRN
  Administered 2016-07-17: 29.9 via INTRAVENOUS

## 2016-07-17 MED ORDER — LIDOCAINE HCL (PF) 2 % IJ SOLN
INTRAMUSCULAR | Status: AC
  Filled 2016-07-17: qty 10

## 2016-07-17 MED ORDER — FAMOTIDINE 20 MG PO TABS
ORAL_TABLET | ORAL | Status: AC
  Administered 2016-07-17: 20 mg
  Filled 2016-07-17: qty 1

## 2016-07-17 NOTE — ED Notes (Signed)
Pt taken to [place] for thorencentesis. Nad.

## 2016-07-17 NOTE — Progress Notes (Addendum)
[facility] for vancomycin and zosyn Indication: sepsis  Allergies  Allergen Reactions  . Ketorolac Tromethamine Shortness Of Breath  . Ivp Dye [Iodinated Diagnostic Agents] Other (See Comments)    Skin gets very red, unable to walk   . Nsaids Other (See Comments)    Flares ulcers    Patient Measurements: Height: 5' 9"  (175.3 cm) Weight: 273 lb (123.8 kg) IBW/kg (Calculated) : 66.2 Adjusted Body Weight:   Vital Signs: Temp: 101.3 F (38.5 C) (09/15 0545) Temp Source: Rectal (09/15 0545) BP: 144/67 (09/15 0540) Pulse Rate: 111 (09/15 0540)  Labs:  Recent Labs  07/17/16 0524  WBC 4.4  HGB 12.8  PLT PENDING    CrCl cannot be calculated (Patient's most recent lab result is older than the maximum 21 days allowed.).  No results for input(s): VANCOTROUGH, VANCOPEAK, VANCORANDOM, GENTTROUGH, GENTPEAK, GENTRANDOM, TOBRATROUGH, TOBRAPEAK, TOBRARND, AMIKACINPEAK, AMIKACINTROU, AMIKACIN in the last 72 hours.   Microbiology: No results found for this or any previous visit (from the past 720 hour(s)).  Medical History: Past Medical History:  Diagnosis Date  . Chronic abdominal pain   . Chronic back pain   . Diabetes mellitus without complication ([place])   . Hepatitis C   . Insomnia   . Long-term current use of methadone for opiate dependence ([place])   . Migraine headache   . Peptic ulcer     Medications:  Scheduled:  . ipratropium-albuterol       Infusions:  . sodium chloride    . piperacillin-tazobactam (ZOSYN)  IV    . sodium chloride 1,000 mL (07/17/16 0600)  . vancomycin     PRN:  Anti-infectives    Start     Dose/Rate Route Frequency Ordered Stop   07/17/16 0615  piperacillin-tazobactam (ZOSYN) IVPB 3.375 g     3.375 g 12.5 mL/hr over 240 Minutes Intravenous Every 8 hours 07/17/16 0602     07/17/16 0615  vancomycin (VANCOCIN) 2,500 mg in sodium chloride 0.9 % 500 mL IVPB     2,500 mg 250 mL/hr over 120 Minutes Intravenous   Once 07/17/16 0602        Assessment: 30 yo female being admitted for clinical pneumonia. Pt presented for SOB and pain from rib fractures from falling off horse 8/24 and is on methadone for hx heroin use. Found to be febrile so started sepsis protocol   Goal of Therapy:  Vancomycin trough level 15-20 mcg/ml  Plan:  Protocol initiated with a one-time dose of vancomycin 2500 mg IV.  Cont vanc 1250 mg IV q8 hours Cont zosyn 3.375 grams Q8.  F/u renal function , cultures and clinical course  Thanks for allowing pharmacy to be a part of this patient's care.  Excell Seltzer, PharmD Clinical Pharmacist

## 2016-07-17 NOTE — ED Notes (Signed)
Pt has been using spirometry every 2 hrs in ED.

## 2016-07-17 NOTE — ED Provider Notes (Addendum)
TIME SEEN: 4:30 AM  CHIEF COMPLAINT: Right-sided chest pain, shortness of breath  HPI: Pt is a 30 y.o. female with history of IV heroin abuse who is on a pain management contract with her primary care physician Dr. Luan Pulling currently on methadone, migraine headaches, hepatitis C, diabetes, obesity who presents to the emergency department with complaints of increasing right-sided chest pain and shortness of breath. Was seen in the emergency department on August 24th when she fell off her horse. She was found to have acute sixth, seventh and eighth acute rib fractures at that time. No pneumothorax. States symptoms progressively getting worse. States she recently had bilateral lower extremity swelling. Had negative venous Doppler of bilateral lower extremities on September 8. Reports her swelling has resolved. No history of PE or DVT. No new injury to her ribs. States Dr. Luan Pulling went up on her methadone dose for 2 weeks for pain control. She is still taking methadone daily.  Denies fever.  Has had several days dry cough.  No vomiting, diarrhea, abdominal pain, rash, HA, neck pain, dysuria, hematuria.  Patient is allergic to IV contrast and reports tongue and lip swelling, difficulty breathing.  PCP - Luan Pulling  ROS: See HPI Constitutional: no fever  Eyes: no drainage  ENT: no runny nose   Cardiovascular:  Right-sided chest pain  Resp:  SOB, + cough GI: no vomiting or diarrhea GU: no dysuria Integumentary: no rash  Allergy: no hives  Musculoskeletal: no leg swelling  Neurological: no slurred speech ROS otherwise negative  PAST MEDICAL HISTORY/PAST SURGICAL HISTORY:  Past Medical History:  Diagnosis Date  . Chronic abdominal pain   . Chronic back pain   . Diabetes mellitus without complication ([place])   . Hepatitis C   . Insomnia   . Migraine headache   . Peptic ulcer     MEDICATIONS:  Prior to Admission medications   Medication Sig Start Date End Date Taking? Authorizing Provider   clonazePAM (KLONOPIN) 1 MG tablet Take 1 mg by mouth 4 (four) times daily.     Historical Provider, MD  etonogestrel (NEXPLANON) 68 MG IMPL implant 1 each by Subdermal route once.    Historical Provider, MD  HYDROcodone-acetaminophen (NORCO/VICODIN) 5-325 MG tablet Take 1 tablet by mouth every 6 (six) hours as needed for moderate pain. Patient not taking: Reported on 06/25/2016 06/22/16   Milton Ferguson, MD  metFORMIN (GLUCOPHAGE-XR) 500 MG 24 hr tablet Take 1,000 mg by mouth 2 (two) times daily.    Historical Provider, MD  methadone (DOLOPHINE) 10 MG tablet Take 20 mg by mouth at bedtime.     Historical Provider, MD  Suvorexant (BELSOMRA) 15 MG TABS Take 15 mg by mouth at bedtime.    Historical Provider, MD  vortioxetine HBr (TRINTELLIX) 10 MG TABS Take 10 mg by mouth every evening.    Historical Provider, MD    ALLERGIES:  Allergies  Allergen Reactions  . Ketorolac Tromethamine Shortness Of Breath  . Ivp Dye [Iodinated Diagnostic Agents] Other (See Comments)    Skin gets very red, unable to walk   . Nsaids Other (See Comments)    Flares ulcers    SOCIAL HISTORY:  Social History  Substance Use Topics  . Smoking status: Current Every Day Smoker    Packs/day: 0.00    Years: 5.00    Types: E-cigarettes  . Smokeless tobacco: Never Used  . Alcohol use No    FAMILY HISTORY: Family History  Problem Relation Age of Onset  . Hypertension Mother   .  Hyperlipidemia Other     EXAM: BP 145/77   Pulse 116   Temp 98.1 F (36.7 C) (Oral)   Resp (!) 30   Ht 5' 9"  (1.753 m)   Wt 273 lb (123.8 kg)   SpO2 93%   BMI 40.32 kg/m  CONSTITUTIONAL: Alert and oriented and responds appropriately to questions. Appears uncomfortable, nontoxic, well-hydrated and well-nourished, obese HEAD: Normocephalic EYES: Conjunctivae clear, PERRL ENT: normal nose; no rhinorrhea; moist mucous membranes NECK: Supple, no meningismus, no LAD  CARD: Regular and tachycardic; S1 and S2 appreciated; no murmurs,  no clicks, no rubs, no gallops CHEST:  Tender to palpation over the right lateral chest without crepitus, ecchymosis or deformity, no flail chest RESP: Patient is splinting as she appears uncomfortable when taking a deep breath, she is tachypneic, breath sounds clear and equal bilaterally; some squeaking noises that appear to be coming from upper airway transmitted noises, no wheezes, no rhonchi, no rales, no hypoxia or respiratory distress, speaking full sentences ABD/GI: Normal bowel sounds; non-distended; soft, non-tender, no rebound, no guarding, no peritoneal signs BACK:  The back appears normal and is non-tender to palpation, there is no CVA tenderness EXT: Normal ROM in all joints; non-tender to palpation; no edema; normal capillary refill; no cyanosis, no calf tenderness or swelling, 2+ DP pulses bilaterally    SKIN: Normal color for age and race; warm; no rash NEURO: Moves all extremities equally, normal gait PSYCH: The patient's mood and manner are appropriate. Grooming and personal hygiene are appropriate.  MEDICAL DECISION MAKING: Patient here with right-sided rib fractures that occurred on August 24 with increasing pain and shortness of breath. I do not feel the patient is drug seeking. She is very tearful when she talks about her history of IV drug abuse and feels in varus to ask her PCP for more methadone for pain control during this time. She is tachycardic, tachypneic and oxygen sats in the low 90s on room air. We'll give breathing treatments, IV fluids, Dilaudid, Zofran and reassess. She understands that she cannot be discharged home with further pain medication and she states she does not want it. Will obtain right rib series, chest x-ray, labs. Will obtain a d-dimer as she is at risk for pulmonary embolus given she is obese, is a smoker and has been relatively less mobile than normal because of her pain. She cannot receive IV contrast for CT scan because of an allergy. If d-dimer is  positive, she will need a VQ scan in the morning. She has been given and senna spirometer by respiratory therapy and shown how to use it. Given she is tachycardic, tachypneic, we'll also obtain a rectal temperature to make sure that this is not sepsis.  ED PROGRESS: 5:35 AM  Pt's rectal temp is 101.3.   Suspect clinical PNA.  Will start sepsis protocol.  Sats 86% on RA at rest.  Will place on nasal cannula.  Does not wear O2 at home.  Will give IVF, broad spectrum antibiotics.  Will need admission.   6:00 AM  Pt's d dimer elevated, will order VQ in AM.  HCG is negative (confirmed by nurse Otila Kluver).   6:40 AM  Pt appears to have large R sided PNA, no PTX on CXR.  D/w Dr. Marin Comment with hospitalist service for admission to stepdown.  I will place holding orders per his request (inpt, stepdown) and day hospitalist team will see.  Patient is stable.  Patient updated with plan.  6:55 AM  D/w radiologist  Dr. Collins [place]. It appears she has right middle and lower lobe consolidation/collapse likely consistent with infiltrate with pleural effusion. Given history of rib fractures, hemothorax cannot be excluded but I feel this is less likely especially given fever. Discussed with radiologist the patient cannot have IV CT contrast. He feels a CT without contrast would be helpful to evaluate for hemithorax. She will also obtain a VQ scan today to rule out pulmonary embolus.  Both tests have been ordered in the ED.  7:30 AM  Pt is stable.  Doing well on 2L [place].  I think that even if patient does have a hemothorax, she is stable and likely not actively hemorrhaging given her fractures were almost one month ago. Not on anticoagulation.   7:45 Am  D/w Dr. Posey Pronto with hospitalist service. He will see patient for admission. She has a stepdown bed waiting. She is stable at this time. He agrees that this is likely infiltrate, pleural effusion. He will discuss patient's case with Dr. Luan Pulling to determine if she needs a thoracentesis. She  does not need it emergently.  I reviewed all nursing notes, vitals, pertinent old records, EKGs, labs, imaging (as available).     EKG Interpretation  Date/Time:  Friday July 17 2016 04:25:58 EDT Ventricular Rate:  113 PR Interval:    QRS Duration: 91 QT Interval:  340 QTC Calculation: 467 R Axis:   67 Text Interpretation:  Sinus tachycardia Baseline wander in lead(s) II aVF V5 No significant change since last tracing Confirmed by WARD,  DO, KRISTEN ([id number]) on 07/17/2016 4:38:36 AM        Angiocath insertion Performed by: Nyra Jabs  Consent: Verbal consent obtained. Risks and benefits: risks, benefits and alternatives were discussed Time out: Immediately prior to procedure a "time out" was called to verify the correct patient, procedure, equipment, support staff and site/side marked as required.  Preparation: Patient was prepped and draped in the usual sterile fashion.  Vein Location: L AC  Ultrasound Guided  Gauge: 20  Normal blood return and flush without difficulty Patient tolerance: Patient tolerated the procedure well with no immediate complications.  IV later infiltrated and was removed.     CRITICAL CARE Performed by: Nyra Jabs   Total critical care time: 60 minutes  Critical care time was exclusive of separately billable procedures and treating other patients.  Critical care was necessary to treat or prevent imminent or life-threatening deterioration.  Critical care was time spent personally by me on the following activities: development of treatment plan with patient and/or surrogate as well as nursing, discussions with consultants, evaluation of patient's response to treatment, examination of patient, obtaining history from patient or surrogate, ordering and performing treatments and interventions, ordering and review of laboratory studies, ordering and review of radiographic studies, pulse oximetry and re-evaluation of patient's  condition.      [place], DO 07/17/16 [id number]

## 2016-07-17 NOTE — ED Notes (Signed)
Floor unable to take report at this time.

## 2016-07-17 NOTE — Consult Note (Signed)
Consult requested by: [facility] hospitalist Consult requested for abnormal chest CT:  HPI: This is a 30 year old who has chronic back pain diabetes asthma migraine headaches and hepatitis C. She had been in her usual state of fair health but fell striking the right side of her chest. She's been having pain in her ribs since then. She's developed fever and chills and came to the emergency department. When she was seen in the emergency department she had chest x-ray and then chest CT and this shows a large right pleural effusion. She has extensive pneumonia as well and some compressive atelectasis. She has rib fractures.  Past Medical History:  Diagnosis Date  . Chronic abdominal pain   . Chronic back pain   . Diabetes mellitus without complication ([name])   . Hepatitis C   . Insomnia   . Long-term current use of methadone for opiate dependence ([place])   . Migraine headache   . Peptic ulcer      Family History  Problem Relation Age of Onset  . Hypertension Mother   . Hyperlipidemia Other      Social History   Social History  . Marital status: Single    Spouse name: N/A  . Number of children: N/A  . Years of education: N/A   Social History Main Topics  . Smoking status: Current Every Day Smoker    Packs/day: 0.00    Years: 5.00    Types: E-cigarettes  . Smokeless tobacco: Never Used  . Alcohol use No  . Drug use: No  . Sexual activity: No   Other Topics Concern  . None   Social History Narrative  . None     ROS: She's having a lot of pain in her ribs. She has chronic pain has been on methadone long-term and she has successfully been able to reduce her methadone dose from a bout 160 mg a day down to 20 mg a day over the last 2-3 years. She has increased her dose slightly recently because of the pain in her ribs. She is coughing. Otherwise per the history and physical.    Objective: Vital signs in last 24 hours: Temp:  [98.1 F (36.7 C)-101.3 F (38.5 C)] 98.9 F (37.2  C) (09/15 0707) Pulse Rate:  [95-116] 102 (09/15 0800) Resp:  [16-30] 16 (09/15 0800) BP: (111-145)/(61-87) 111/61 (09/15 0800) SpO2:  [92 %-95 %] 95 % (09/15 0800) Weight:  [123.8 kg (273 lb)] 123.8 kg (273 lb) (09/15 0423) Weight change:     Intake/Output from previous day: No intake/output data recorded.  PHYSICAL EXAM She is an obese female who is in moderate distress. She has some erythema of her face. She says it feels like it itches. Her pupils are reactive nose and throat are clear mucous membranes are moist her neck is supple without masses. Her chest shows diminished breath sounds on the right and some rhonchi. Her heart is regular without gallop. Her abdomen is soft without masses. Extremities showed no edema. Central nervous system exam grossly intact  Lab Results: Basic Metabolic Panel:  Recent Labs  07/17/16 0524  NA 135  K 3.9  CL 102  CO2 25  GLUCOSE 99  BUN 5*  CREATININE 0.58  CALCIUM 9.0   Liver Function Tests:  Recent Labs  07/17/16 0524  AST 54*  ALT 48  ALKPHOS 115  BILITOT 1.5*  PROT 7.6  ALBUMIN 3.6   No results for input(s): LIPASE, AMYLASE in the last 72 hours. No results for  input(s): AMMONIA in the last 72 hours. CBC:  Recent Labs  07/17/16 0524  WBC 4.4  NEUTROABS 2.6  HGB 12.8  HCT 38.1  MCV 88.6  PLT 105*   Cardiac Enzymes:  Recent Labs  07/17/16 0524  TROPONINI <0.03   BNP: No results for input(s): PROBNP in the last 72 hours. D-Dimer:  Recent Labs  07/17/16 0524  DDIMER 4.53*   CBG: No results for input(s): GLUCAP in the last 72 hours. Hemoglobin A1C: No results for input(s): HGBA1C in the last 72 hours. Fasting Lipid Panel: No results for input(s): CHOL, HDL, LDLCALC, TRIG, CHOLHDL, LDLDIRECT in the last 72 hours. Thyroid Function Tests: No results for input(s): TSH, T4TOTAL, FREET4, T3FREE, THYROIDAB in the last 72 hours. Anemia Panel: No results for input(s): VITAMINB12, FOLATE, FERRITIN, TIBC, IRON,  RETICCTPCT in the last 72 hours. Coagulation: No results for input(s): LABPROT, INR in the last 72 hours. Urine Drug Screen: Drugs of Abuse     Component Value Date/Time   LABOPIA POSITIVE (A) 08/21/2010 0653   COCAINSCRNUR NONE DETECTED 08/21/2010 0653   LABBENZ POSITIVE (A) 08/21/2010 0653   AMPHETMU NONE DETECTED 08/21/2010 0653   THCU NONE DETECTED 08/21/2010 0653   LABBARB  08/21/2010 0653    NONE DETECTED        DRUG SCREEN FOR MEDICAL PURPOSES ONLY.  IF CONFIRMATION IS NEEDED FOR ANY PURPOSE, NOTIFY LAB WITHIN 5 DAYS.        LOWEST DETECTABLE LIMITS FOR URINE DRUG SCREEN Drug Class       Cutoff (ng/mL) Amphetamine      1000 Barbiturate      200 Benzodiazepine   235 Tricyclics       361 Opiates          300 Cocaine          300 THC              50    Alcohol Level: No results for input(s): ETH in the last 72 hours. Urinalysis:  Recent Labs  07/17/16 0531  COLORURINE YELLOW  LABSPEC 1.015  PHURINE 8.0  GLUCOSEU NEGATIVE  HGBUR NEGATIVE  BILIRUBINUR NEGATIVE  KETONESUR NEGATIVE  PROTEINUR NEGATIVE  NITRITE NEGATIVE  LEUKOCYTESUR NEGATIVE   Misc. Labs:   ABGS: No results for input(s): PHART, PO2ART, TCO2, HCO3 in the last 72 hours.  Invalid input(s): PCO2   MICROBIOLOGY: Recent Results (from the past 240 hour(s))  Blood Culture (routine x 2)     Status: None (Preliminary result)   Collection Time: 07/17/16  5:20 AM  Result Value Ref Range Status   Specimen Description BLOOD LEFT HAND  Final   Special Requests BOTTLES DRAWN AEROBIC AND ANAEROBIC 8 CC EACH  Final   Culture PENDING  Incomplete   Report Status PENDING  Incomplete  Blood Culture (routine x 2)     Status: None (Preliminary result)   Collection Time: 07/17/16  5:46 AM  Result Value Ref Range Status   Specimen Description BLOOD RIGHT ARM  Final   Special Requests BOTTLES DRAWN AEROBIC AND ANAEROBIC 8 CC EACH  Final   Culture PENDING  Incomplete   Report Status PENDING  Incomplete     Studies/Results: Dg Chest 2 View  Result Date: 07/17/2016 CLINICAL DATA:  Sudden onset shortness of breath. Elevated D-dimer. Recent right rib fractures after fall from horse. EXAM: RIGHT RIBS - 2 VIEW; CHEST - 2 VIEW COMPARISON:  Chest radiograph 06/25/2016 FINDINGS: There is shallow lung inflation. There is right lower  lobe consolidation with a medium-sized right pleural effusion. There is additional opacity in the parahilar right upper lobe is new compared to the prior study. The left lung is clear. Cardiomediastinal contours are unchanged. Minimally displaced fractures of the lateral right sixth, seventh and eighth ribs are again seen. IMPRESSION: 1. Suspected right middle and lower lobe collapse. 2. Medium-sized right pleural effusion. In the context of recent rib fractures, an element of hemothorax would be difficult to exclude. This could be further characterized with chest CT. 3. Minimally displaced fractures of the lateral right 6th-8th ribs, unchanged Electronically Signed   By: Ulyses Jarred M.D.   On: 07/17/2016 06:38   Dg Ribs Unilateral Right  Result Date: 07/17/2016 CLINICAL DATA:  Sudden onset shortness of breath. Elevated D-dimer. Recent right rib fractures after fall from horse. EXAM: RIGHT RIBS - 2 VIEW; CHEST - 2 VIEW COMPARISON:  Chest radiograph 06/25/2016 FINDINGS: There is shallow lung inflation. There is right lower lobe consolidation with a medium-sized right pleural effusion. There is additional opacity in the parahilar right upper lobe is new compared to the prior study. The left lung is clear. Cardiomediastinal contours are unchanged. Minimally displaced fractures of the lateral right sixth, seventh and eighth ribs are again seen. IMPRESSION: 1. Suspected right middle and lower lobe collapse. 2. Medium-sized right pleural effusion. In the context of recent rib fractures, an element of hemothorax would be difficult to exclude. This could be further characterized with chest  CT. 3. Minimally displaced fractures of the lateral right 6th-8th ribs, unchanged Electronically Signed   By: Ulyses Jarred M.D.   On: 07/17/2016 06:38    Medications:  Prior to Admission:  (Not in a hospital admission) Scheduled: . clonazePAM  1 mg Oral QID  . ipratropium-albuterol      . methadone  40 mg Oral QHS  . Suvorexant  15 mg Oral QHS  . vortioxetine HBr  10 mg Oral QPM   Continuous: . sodium chloride 125 mL/hr at 07/17/16 0730  . sodium chloride    . piperacillin-tazobactam (ZOSYN)  IV Stopped (07/17/16 0707)  . vancomycin    . vancomycin 2,500 mg (07/17/16 0732)   FBX:[id number], HYDROcodone-acetaminophen, ondansetron (ZOFRAN) IV  Assesment:She has community-acquired pneumonia but also has a large right pleural effusion likely a hemothorax. She has acute hypoxic respiratory failure from that.  I discussed all this with Dr. Posey Pronto and plan is for her to have thoracentesis. Active Problems:   CAP (community acquired pneumonia)   Acute hypoxemic respiratory failure ([place])    Plan: Thoracentesis today. I have taken the liberty of giving her some Benadryl.    LOS: 0 days   Logan Vegh L 07/17/2016, 8:52 AM

## 2016-07-17 NOTE — Progress Notes (Signed)
Thoracentesis complete no signs of distress. 1300 ml red colored pleural fluid removed.

## 2016-07-17 NOTE — H&P (Addendum)
[facility] History and Physical   Patient: Jaime [name]:[id number]   PCP: Alonza Bogus, MD DOB: Feb 23, 1986   DOA: 07/17/2016   DOS: 07/17/2016   DOS: the patient was seen and examined on 07/17/2016  Patient coming from: The patient is coming from home.  Chief Complaint: Shortness of breath and right-sided chest pain  HPI: Jaime [name] is a 30 y.o. female with Past medical history of chronic opioid dependence on methadone, obesity, insomnia. Patient presented with complains of right-sided chest pain. Patient while riding a horse had a fall on 06/25/2016 and was seen in the ER. Patient was discharged home from the ER since hemodynamically stable. Patient mentions after going home her symptoms are progressively worsened. Denies any fever or chills denies any nausea or vomiting. Started having worsening pain and ask her PCP to increase her methadone. The dose was increased and despite that her pain was not controlled and therefore she decided to come to the hospital. Denies any focal deficit denies any abdominal pain diarrhea or constipation.  ED Course: Initial chest x-ray showed multiple rib fractures and right-sided lung collapse. CT chest without contrast was showing right-sided pleural effusion, suspicious for hemothorax, four rib fractures 6-9. Also had vancomycin induced histamine reaction. Patient was admitted in the step down unit for further workup and treatment. Started on IV antibiotics with code sepsis protocol.  At her baseline ambulates without any support And is independent for most of her ADL; manages her medication on her own.  Review of Systems: as mentioned in the history of present illness.  A comprehensive review of the other systems is negative.  Past Medical History:  Diagnosis Date  . Chronic abdominal pain   . Chronic back pain   . Diabetes mellitus without complication ([place])   . Hepatitis C   . Insomnia   . Long-term current use of methadone  for opiate dependence ([place])   . Migraine headache   . Peptic ulcer    Past Surgical History:  Procedure Laterality Date  . CHOLECYSTECTOMY     Social History:  reports that she has been smoking E-cigarettes.  She has been smoking about 0.00 packs per day for the past 5.00 years. She has never used smokeless tobacco. She reports that she does not drink alcohol or use drugs.  Allergies  Allergen Reactions  . Ketorolac Tromethamine Shortness Of Breath  . Ivp Dye [Iodinated Diagnostic Agents] Other (See Comments)    Skin gets very red, unable to walk   . Nsaids Other (See Comments)    Flares ulcers     Family History  Problem Relation Age of Onset  . Hypertension Mother   . Hyperlipidemia Other      Prior to Admission medications   Medication Sig Start Date End Date Taking? Authorizing Provider  clonazePAM (KLONOPIN) 1 MG tablet Take 1 mg by mouth 4 (four) times daily.     Historical Provider, MD  etonogestrel (NEXPLANON) 68 MG IMPL implant 1 each by Subdermal route once.    Historical Provider, MD  metFORMIN (GLUCOPHAGE-XR) 500 MG 24 hr tablet Take 1,000 mg by mouth 2 (two) times daily.    Historical Provider, MD  methadone (DOLOPHINE) 10 MG tablet Take 40 mg by mouth at bedtime.     Historical Provider, MD  Suvorexant (BELSOMRA) 15 MG TABS Take 15 mg by mouth at bedtime.    Historical Provider, MD  vortioxetine HBr (TRINTELLIX) 10 MG TABS Take 10 mg by mouth every evening.  Historical Provider, MD    Physical Exam: Vitals:   07/17/16 0630 07/17/16 0700 07/17/16 0707 07/17/16 0800  BP: 130/74 139/70  111/61  Pulse: 102 95  102  Resp: 22 22  16   Temp:   98.9 F (37.2 C)   TempSrc:   Oral   SpO2: 93% 94%  95%  Weight:      Height:        General: Alert, Awake and Oriented to Time, Place and Person. Appear in moderate distress Eyes: PERRL, Conjunctiva normal ENT: Oral Mucosa clear moist. Neck: difficult to assess JVD, no Abnormal Mass Or lumps Cardiovascular: S1  and S2 Present, no Murmur, Peripheral Pulses Present Respiratory: Bilateral Air entry equal and Decreased, rght basal Crackles, no wheezes Abdomen: Bowel Sound present, Soft and no tenderness Skin: no redness, no Rash  Extremities: bilateral Pedal edema, no calf tenderness Neurologic: Grossly no focal neuro deficit. Bilaterally Equal motor strength  Labs on Admission:  CBC:  Recent Labs Lab 07/17/16 0524  WBC 4.4  NEUTROABS 2.6  HGB 12.8  HCT 38.1  MCV 88.6  PLT 786*   Basic Metabolic Panel:  Recent Labs Lab 07/17/16 0524  NA 135  K 3.9  CL 102  CO2 25  GLUCOSE 99  BUN 5*  CREATININE 0.58  CALCIUM 9.0   GFR: Estimated Creatinine Clearance: 142.2 mL/min (by C-G formula based on SCr of 0.58 mg/dL). Liver Function Tests:  Recent Labs Lab 07/17/16 0524  AST 54*  ALT 48  ALKPHOS 115  BILITOT 1.5*  PROT 7.6  ALBUMIN 3.6   No results for input(s): LIPASE, AMYLASE in the last 168 hours. No results for input(s): AMMONIA in the last 168 hours. Coagulation Profile: No results for input(s): INR, PROTIME in the last 168 hours. Cardiac Enzymes:  Recent Labs Lab 07/17/16 0524  TROPONINI <0.03   BNP (last 3 results) No results for input(s): PROBNP in the last 8760 hours. HbA1C: No results for input(s): HGBA1C in the last 72 hours. CBG: No results for input(s): GLUCAP in the last 168 hours. Lipid Profile: No results for input(s): CHOL, HDL, LDLCALC, TRIG, CHOLHDL, LDLDIRECT in the last 72 hours. Thyroid Function Tests: No results for input(s): TSH, T4TOTAL, FREET4, T3FREE, THYROIDAB in the last 72 hours. Anemia Panel: No results for input(s): VITAMINB12, FOLATE, FERRITIN, TIBC, IRON, RETICCTPCT in the last 72 hours. Urine analysis:    Component Value Date/Time   COLORURINE YELLOW 07/17/2016 [place] 07/17/2016 0531   LABSPEC 1.015 07/17/2016 0531   PHURINE 8.0 07/17/2016 0531   GLUCOSEU NEGATIVE 07/17/2016 0531   HGBUR NEGATIVE 07/17/2016  0531   BILIRUBINUR NEGATIVE 07/17/2016 0531   KETONESUR NEGATIVE 07/17/2016 0531   PROTEINUR NEGATIVE 07/17/2016 0531   UROBILINOGEN 1.0 10/07/2013 0644   NITRITE NEGATIVE 07/17/2016 0531   LEUKOCYTESUR NEGATIVE 07/17/2016 0531    Radiological Exams on Admission: Dg Chest 2 View  Result Date: 07/17/2016 CLINICAL DATA:  Sudden onset shortness of breath. Elevated D-dimer. Recent right rib fractures after fall from horse. EXAM: RIGHT RIBS - 2 VIEW; CHEST - 2 VIEW COMPARISON:  Chest radiograph 06/25/2016 FINDINGS: There is shallow lung inflation. There is right lower lobe consolidation with a medium-sized right pleural effusion. There is additional opacity in the parahilar right upper lobe is new compared to the prior study. The left lung is clear. Cardiomediastinal contours are unchanged. Minimally displaced fractures of the lateral right sixth, seventh and eighth ribs are again seen. IMPRESSION: 1. Suspected right middle and lower  lobe collapse. 2. Medium-sized right pleural effusion. In the context of recent rib fractures, an element of hemothorax would be difficult to exclude. This could be further characterized with chest CT. 3. Minimally displaced fractures of the lateral right 6th-8th ribs, unchanged Electronically Signed   By: Ulyses Jarred M.D.   On: 07/17/2016 06:38   Dg Ribs Unilateral Right  Result Date: 07/17/2016 CLINICAL DATA:  Sudden onset shortness of breath. Elevated D-dimer. Recent right rib fractures after fall from horse. EXAM: RIGHT RIBS - 2 VIEW; CHEST - 2 VIEW COMPARISON:  Chest radiograph 06/25/2016 FINDINGS: There is shallow lung inflation. There is right lower lobe consolidation with a medium-sized right pleural effusion. There is additional opacity in the parahilar right upper lobe is new compared to the prior study. The left lung is clear. Cardiomediastinal contours are unchanged. Minimally displaced fractures of the lateral right sixth, seventh and eighth ribs are again  seen. IMPRESSION: 1. Suspected right middle and lower lobe collapse. 2. Medium-sized right pleural effusion. In the context of recent rib fractures, an element of hemothorax would be difficult to exclude. This could be further characterized with chest CT. 3. Minimally displaced fractures of the lateral right 6th-8th ribs, unchanged Electronically Signed   By: Ulyses Jarred M.D.   On: 07/17/2016 06:38   Ct Chest Wo Contrast  Result Date: 07/17/2016 CLINICAL DATA:  Right-sided chest pain, fell off horse 06/25/2016, abnormal chest x-ray EXAM: CT CHEST WITHOUT CONTRAST TECHNIQUE: Multidetector CT imaging of the chest was performed following the standard protocol without IV contrast. COMPARISON:  Radiograph 07/17/2016, prior chest CT 08/25/2014 FINDINGS: Cardiovascular: Limited without administration of intravenous contrast. Non contrasted aorta demonstrates grossly normal caliber. Minimal anterior pericardial thickening shows no significant change compared with previous CT scan. Trachea and mainstem bronchi are within normal limits. Mediastinum/Nodes: No significantly enlarged axillary lymph nodes. No definite mediastinal fluid collections evident. Stable 9 mm right paratracheal lymph node. Mild increased retrosternal lymph node measuring 9 mm. Slight increased lymph node adjacent to the aortic arch measuring 7 mm. Lungs/Pleura: Small focus of ground-glass density within the subpleural left upper lobe could relate to a focus of pneumonitis, atelectasis, or less likely small contusion given history of fall. Large right-sided pleural effusion with non simple Hounsfield units. Consolidation within the right upper lobe, right middle lobe and right lower lobe with air bronchograms. No pneumothorax identified. Upper Abdomen: Limited without intravenous contrast. Possible low-density fluid collection along the posterior aspect of the spleen. Elevated right diaphragm. Spleen is enlarged at 16 cm. Musculoskeletal: Acute  displaced rib fractures involving the right sixth through ninth ribs. Mild wedge compression deformities at T8, T10, T11 and T12 suspected to be chronic as these appear present on 2015 examination. Mild subcutaneous soft tissue stranding and edema is present within the posterior lateral body wall. IMPRESSION: 1. Large right-sided pleural effusion with non simple density. There is associated partial consolidation in the right upper, right middle and right lower lobes with air bronchograms. Given the presence of acute right-sided rib fractures, findings could relate to the evolving hemo thorax. Given timing of injury, multifocal pneumonia with parapneumonic effusion is also a consideration. No pneumothorax identified. 2. Slight interval increase in mediastinal lymph nodes. 3. Splenomegaly. Possible low-density fluid collection along the posterior aspect of the spleen; if splenic injury is suspected, contrasted CT examination may be obtained. 4. Small focus of ground-glass density within the subpleural left upper lobe could relate to atelectasis or focus of pneumonitis. These results will be called to  the ordering clinician or representative by the Radiologist Assistant, and communication documented in the PACS or zVision Dashboard. Electronically Signed   By: Donavan Foil M.D.   On: 07/17/2016 08:50   EKG: Independently reviewed. normal sinus rhythm, nonspecific ST and T waves changes.  Assessment/Plan 1. Acute hypoxemic respiratory failure (HCC) Sepsis due to Community-acquired pneumonia. Right lung atelectasis. Right pleural effusion. Suspected hemothorax. Multiple rib fractures involving 4 ribs. Patient presented with a fall earlier.  the patient presents with complaints of right-sided chest pain and CT scan confirms findings of right-sided pleural effusion as well as possibility of hemothorax. Multifocal pneumonia cannot be ruled out. With tachycardia, tachypnea as well as fever the patient is  meeting sepsis criteria. With this the patient has received sepsis bundle in the ER. Currently on IV vancomycin and IV Zosyn. Continue IV fluids. Patient will be admitted in the step down unit. Maintains oxygen saturation 100%. Pulmonary consulted. Ultrasound thoracentesis is recommended. Incentive spirometry.  Patient is at high risk for decompensation, worsening of her hemothorax, worsening respiratory status after thoracentesis, may require intubation if respiratory status further worsening.  2. Chronic opioid dependence. Patient is on chronic methadone 20 mg daily. Recently her dose was increased to 40 mg daily. Patient's PCP is Dr. Luan Pulling and discussed with them and the patient will be back on 20 mg methadone. Using when necessary Norco for acute pain control for severe pain.  3. Elevated d-dimer. VQ scan is ordered. Will follow-up on the results.  4. Type 2 diabetes mellitus. Excellent patient is on metformin at home. Currently holding patient will be placed on sliding scale insulin.  5. Insomnia. Continuing home medication.  Addendum: Vancomycin-induced facial flushing. Likely from histamine release from it, as in. Recommended RN to reduce the rate of vancomycin. When necessary Benadryl ordered. Flushing has resolved after the infusion completed. On reevaluation patient did not have any other complaints other than facial flushing, no shortness of breath no throat closing no swelling of the face. Continue vancomycin at the very slow rate. Continue Benadryl when necessary. If the patient has further worsening of the symptoms discontinue vancomycin and give IV Benadryl and IV Pepcid.   Jerusha Reising 2:35 PM 07/17/2016   Nutrition: Nothing by mouth for the thoracentesis, following that carb modified heart healthy diet. DVT Prophylaxis: mechanical compression device  Advance goals of care discussion: Full code   Consults: Pulmonary  Family Communication: family was  present at bedside, at the time of interview.  Opportunity was given to ask question and all questions were answered satisfactorily.  Disposition: Admitted as inpatient, step-down unit. Likely to be discharged home, in 4-5 days.  Author: Berle Mull, MD [facility] Pager: [phone] 07/17/2016  If 7PM-7AM, please contact night-coverage [email or web address] Password [email or web address]

## 2016-07-17 NOTE — Procedures (Signed)
PreOperative Dx: Fell from horse one week ago, RIGHT rib fractures, RIGHT hemothorax Postoperative Dx: Fell from horse one week ago, RIGHT rib fractures, RIGHT hemothoraxeffusion Procedure:   US guided RIGHT thoracentesis Radiologist:  Thornton Papas Anesthesia:  10 ml of 1% lidocaine Specimen:  1.3 L of dark old bloody fluid EBL:   < 1 ml Complications: None

## 2016-07-17 NOTE — ED Triage Notes (Signed)
Pt states she fell off her horse x 3 weeks ago and continues to have sob and right rib pain

## 2016-07-17 NOTE — ED Notes (Signed)
vanc stopped to left upper arm due to possible infiltration. Redness and warmth noted around site but flushed well with saline. Pt c/o pain also. No redness or swelling noted to face at this time. Will continue vanc after zosyn

## 2016-07-17 NOTE — ED Notes (Signed)
Per Dr. Posey Pronto. Still give vancomycin if pt does not refuse.

## 2016-07-17 NOTE — ED Notes (Addendum)
Pt ambulated to bathroom. Back to room and pulse ox ra 86%. Pt placed back on 02 at 2L. Pt performing incentive spirometry

## 2016-07-17 NOTE — ED Notes (Signed)
vanc restarted to right [facility]

## 2016-07-17 NOTE — ED Notes (Signed)
O2 was at 92 with ambulation. Pt tolerated well

## 2016-07-18 LAB — COMPREHENSIVE METABOLIC PANEL
ALBUMIN: 3 g/dL — AB (ref 3.5–5.0)
ALK PHOS: 89 U/L (ref 38–126)
ALT: 36 U/L (ref 14–54)
ANION GAP: 5 (ref 5–15)
AST: 41 U/L (ref 15–41)
BILIRUBIN TOTAL: 1.1 mg/dL (ref 0.3–1.2)
BUN: 7 mg/dL (ref 6–20)
CALCIUM: 8 mg/dL — AB (ref 8.9–10.3)
CO2: 25 mmol/L (ref 22–32)
Chloride: 108 mmol/L (ref 101–111)
Creatinine, Ser: 0.75 mg/dL (ref 0.44–1.00)
GFR calc Af Amer: >60 mL/min (ref >60)
GLUCOSE: 77 mg/dL (ref 65–99)
Potassium: 4.2 mmol/L (ref 3.5–5.1)
Sodium: 138 mmol/L (ref 135–145)
TOTAL PROTEIN: 6.3 g/dL — AB (ref 6.5–8.1)

## 2016-07-18 LAB — URINE CULTURE: Culture: NO GROWTH

## 2016-07-18 LAB — CBC WITH DIFFERENTIAL/PLATELET
BASOS ABS: 0 10*3/uL (ref 0.0–0.1)
BASOS PCT: 0 %
EOS ABS: 0.1 10*3/uL (ref 0.0–0.7)
Eosinophils Relative: 2 %
HEMATOCRIT: 33.4 % — AB (ref 36.0–46.0)
HEMOGLOBIN: 11.1 g/dL — AB (ref 12.0–15.0)
Lymphocytes Relative: 28 %
Lymphs Abs: 0.8 10*3/uL (ref 0.7–4.0)
MCH: 30.1 pg (ref 26.0–34.0)
MCHC: 33.2 g/dL (ref 30.0–36.0)
MCV: 90.5 fL (ref 78.0–100.0)
Monocytes Absolute: 0.3 10*3/uL (ref 0.1–1.0)
Monocytes Relative: 9 %
NEUTROS ABS: 1.8 10*3/uL (ref 1.7–7.7)
NEUTROS PCT: 61 %
Platelets: 102 10*3/uL — ABNORMAL LOW (ref 150–400)
RBC: 3.69 MIL/uL — AB (ref 3.87–5.11)
RDW: 13.8 % (ref 11.5–15.5)
WBC: 3 10*3/uL — AB (ref 4.0–10.5)

## 2016-07-18 LAB — GLUCOSE, CAPILLARY
GLUCOSE-CAPILLARY: 79 mg/dL (ref 65–99)
GLUCOSE-CAPILLARY: 92 mg/dL (ref 65–99)
Glucose-Capillary: 101 mg/dL — ABNORMAL HIGH (ref 65–99)

## 2016-07-18 MED ORDER — OXYCODONE-ACETAMINOPHEN 5-325 MG PO TABS
1.0000 | ORAL_TABLET | ORAL | Status: DC | PRN
  Administered 2016-07-18 – 2016-07-20 (×10): 1 via ORAL
  Filled 2016-07-18 (×10): qty 1

## 2016-07-18 MED ORDER — INSULIN ASPART 100 UNIT/ML ~~LOC~~ SOLN: 0.0000 [IU] | Freq: Three times a day (TID) | SUBCUTANEOUS | Status: DC

## 2016-07-18 NOTE — Progress Notes (Signed)
Subjective: She says she feels better. No complaints except for pain this morning. She had thoracentesis yesterday  Objective: Vital signs in last 24 hours: Temp:  [97.7 F (36.5 C)-98.7 F (37.1 C)] 97.9 F (36.6 C) (09/16 0726) Pulse Rate:  [79-98] 85 (09/16 0726) Resp:  [11-22] 16 (09/16 0726) BP: (110-144)/(57-86) 121/74 (09/16 0726) SpO2:  [87 %-99 %] 95 % (09/16 0726) Weight:  [127.8 kg (281 lb 12 oz)] 127.8 kg (281 lb 12 oz) (09/16 0400) Weight change: 3.968 kg (8 lb 12 oz) Last BM Date: 07/16/16  Intake/Output from previous day: 09/15 0701 - 09/16 0700 In: 3444 [I.V.:1495; IV Piggyback:1949] Out: -   PHYSICAL EXAM General appearance: alert, cooperative, mild distress and morbidly obese Resp: clear to auscultation bilaterally Cardio: regular rate and rhythm, S1, S2 normal, no murmur, click, rub or gallop GI: soft, non-tender; bowel sounds normal; no masses,  no organomegaly Extremities: extremities normal, atraumatic, no cyanosis or edema  Lab Results:  Results for orders placed or performed during the hospital encounter of 07/17/16 (from the past 48 hour(s))  Blood Culture (routine x 2)     Status: None (Preliminary result)   Collection Time: 07/17/16  5:20 AM  Result Value Ref Range   Specimen Description BLOOD LEFT HAND    Special Requests BOTTLES DRAWN AEROBIC AND ANAEROBIC 8 CC EACH    Culture NO GROWTH < 12 HOURS    Report Status PENDING   Lactic acid, plasma     Status: None   Collection Time: 07/17/16  5:20 AM  Result Value Ref Range   Lactic Acid, Venous 1.5 0.5 - 1.9 mmol/L  CBC with Differential     Status: Abnormal   Collection Time: 07/17/16  5:24 AM  Result Value Ref Range   WBC 4.4 4.0 - 10.5 K/uL   RBC 4.30 3.87 - 5.11 MIL/uL   Hemoglobin 12.8 12.0 - 15.0 g/dL   HCT 38.1 36.0 - 46.0 %   MCV 88.6 78.0 - 100.0 fL   MCH 29.8 26.0 - 34.0 pg   MCHC 33.6 30.0 - 36.0 g/dL   RDW 13.7 11.5 - 15.5 %   Platelets 105 (L) 150 - 400 K/uL    Comment:  SPECIMEN CHECKED FOR CLOTS PLATELET COUNT CONFIRMED BY SMEAR    Neutrophils Relative % 60 %   Neutro Abs 2.6 1.7 - 7.7 K/uL   Lymphocytes Relative 27 %   Lymphs Abs 1.2 0.7 - 4.0 K/uL   Monocytes Relative 11 %   Monocytes Absolute 0.5 0.1 - 1.0 K/uL   Eosinophils Relative 2 %   Eosinophils Absolute 0.1 0.0 - 0.7 K/uL   Basophils Relative 0 %   Basophils Absolute 0.0 0.0 - 0.1 K/uL  Comprehensive metabolic panel     Status: Abnormal   Collection Time: 07/17/16  5:24 AM  Result Value Ref Range   Sodium 135 135 - 145 mmol/L   Potassium 3.9 3.5 - 5.1 mmol/L   Chloride 102 101 - 111 mmol/L   CO2 25 22 - 32 mmol/L   Glucose, Bld 99 65 - 99 mg/dL   BUN 5 (L) 6 - 20 mg/dL   Creatinine, Ser 0.58 0.44 - 1.00 mg/dL   Calcium 9.0 8.9 - 10.3 mg/dL   Total Protein 7.6 6.5 - 8.1 g/dL   Albumin 3.6 3.5 - 5.0 g/dL   AST 54 (H) 15 - 41 U/L   ALT 48 14 - 54 U/L   Alkaline Phosphatase 115 38 - 126 U/L  Total Bilirubin 1.5 (H) 0.3 - 1.2 mg/dL   GFR calc non Af Amer >60 >60 mL/min   GFR calc Af Amer >60 >60 mL/min    Comment: (NOTE) The eGFR has been calculated using the CKD EPI equation. This calculation has not been validated in all clinical situations. eGFR's persistently <60 mL/min signify possible Chronic Kidney Disease.    Anion gap 8 5 - 15  Troponin I     Status: None   Collection Time: 07/17/16  5:24 AM  Result Value Ref Range   Troponin I <0.03 <0.03 ng/mL  D-dimer, quantitative (not at [facility])     Status: Abnormal   Collection Time: 07/17/16  5:24 AM  Result Value Ref Range   D-Dimer, Quant 4.53 (H) 0.00 - 0.50 ug/mL-FEU    Comment: (NOTE) At the manufacturer cut-off of 0.50 ug/mL FEU, this assay has been documented to exclude PE with a sensitivity and negative predictive value of 97 to 99%.  At this time, this assay has not been approved by the FDA to exclude DVT/VTE. Results should be correlated with clinical presentation.   Urinalysis, Routine w reflex microscopic (not at  [facility])     Status: None   Collection Time: 07/17/16  5:31 AM  Result Value Ref Range   Color, Urine YELLOW YELLOW   APPearance CLEAR CLEAR   Specific Gravity, Urine 1.015 1.005 - 1.030   pH 8.0 5.0 - 8.0   Glucose, UA NEGATIVE NEGATIVE mg/dL   Hgb urine dipstick NEGATIVE NEGATIVE   Bilirubin Urine NEGATIVE NEGATIVE   Ketones, ur NEGATIVE NEGATIVE mg/dL   Protein, ur NEGATIVE NEGATIVE mg/dL   Nitrite NEGATIVE NEGATIVE   Leukocytes, UA NEGATIVE NEGATIVE    Comment: MICROSCOPIC NOT DONE ON URINES WITH NEGATIVE PROTEIN, BLOOD, LEUKOCYTES, NITRITE, OR GLUCOSE <1000 mg/dL.  Blood Culture (routine x 2)     Status: None (Preliminary result)   Collection Time: 07/17/16  5:46 AM  Result Value Ref Range   Specimen Description BLOOD RIGHT ARM DRAWN BY RN    Special Requests BOTTLES DRAWN AEROBIC AND ANAEROBIC 8 CC EACH    Culture NO GROWTH < 12 HOURS    Report Status PENDING   I-Stat Beta hCG blood, ED ([facility], [facility], [facility] only)     Status: None   Collection Time: 07/17/16  5:55 AM  Result Value Ref Range   I-stat hCG, quantitative <5.0 <5 mIU/mL   Comment 3            Comment:   GEST. AGE      CONC.  (mIU/mL)   <=1 WEEK        5 - 50     2 WEEKS       50 - 500     3 WEEKS       100 - 10,000     4 WEEKS     1,000 - 30,000        FEMALE AND NON-PREGNANT FEMALE:     LESS THAN 5 mIU/mL   Strep pneumoniae urinary antigen     Status: None   Collection Time: 07/17/16  8:04 AM  Result Value Ref Range   Strep Pneumo Urinary Antigen NEGATIVE NEGATIVE    Comment:        Infection due to S. pneumoniae cannot be absolutely ruled out since the antigen present may be below the detection limit of the test. Performed at [facility]   Lactic acid, plasma     Status: None     Collection Time: 07/17/16  8:32 AM  Result Value Ref Range   Lactic Acid, Venous 0.9 0.5 - 1.9 mmol/L  Lactate dehydrogenase (CSF, pleural or peritoneal fluid)     Status: Abnormal   Collection Time: 07/17/16 10:07 AM  Result  Value Ref Range   LD, Fluid 139 (H) 3 - 23 U/L    Comment: (NOTE) Results should be evaluated in conjunction with serum values    Fluid Type-FLDH FLUID     Comment: RIGHT PLEURAL CORRECTED ON 09/15 AT 1104: PREVIOUSLY REPORTED AS Pleural R   Protein, pleural or peritoneal fluid     Status: None   Collection Time: 07/17/16 10:07 AM  Result Value Ref Range   Total protein, fluid 4.0 g/dL    Comment: (NOTE) No normal range established for this test Results should be evaluated in conjunction with serum values    Fluid Type-FTP FLUID     Comment: RIGHT PLEURAL CORRECTED ON 09/15 AT 1107: PREVIOUSLY REPORTED AS Pleural R   Body fluid cell count with differential     Status: Abnormal   Collection Time: 07/17/16 10:07 AM  Result Value Ref Range   Fluid Type-FCT FLUID     Comment: RIGHT PLEURAL CORRECTED ON 09/15 AT 1104: PREVIOUSLY REPORTED AS Pleural R    Color, Fluid RED (A) YELLOW   Appearance, Fluid TURBID (A) CLEAR   WBC, Fluid 5,448 (H) 0 - 1,000 cu mm   Neutrophil Count, Fluid 65 (H) 0 - 25 %   Lymphs, Fluid 33 %   Monocyte-Macrophage-Serous Fluid 2 (L) 50 - 90 %   Eos, Fluid 0 %   Other Cells, Fluid OTHER CELLS IDENTIFIED AS MESOTHELIAL CELLS %    Comment: OTHER CELLS UNIDENTIFIED; SEE CYTOLOGY REPORT  Glucose, pleural or peritoneal fluid     Status: None   Collection Time: 07/17/16 10:07 AM  Result Value Ref Range   Glucose, Fluid 105 mg/dL    Comment: (NOTE) No normal range established for this test Results should be evaluated in conjunction with serum values    Fluid Type-FGLU FLUID     Comment: RIGHT PLEURAL CORRECTED ON 09/15 AT 1112: PREVIOUSLY REPORTED AS Pleural R   Culture, body fluid-bottle     Status: None (Preliminary result)   Collection Time: 07/17/16 10:07 AM  Result Value Ref Range   Specimen Description FLUID RIGHT PLEURAL COLLECTED BY DOCTOR    Special Requests BOTTLES DRAWN AEROBIC AND ANAEROBIC 10CC EACH    Culture NO GROWTH <12 HOURS     Report Status PENDING   Gram stain     Status: None   Collection Time: 07/17/16 10:07 AM  Result Value Ref Range   Specimen Description FLUID RIGHT PLEURAL COLLECTED BY DOCTOR    Special Requests NONE    Gram Stain      CYTOSPIN SMEAR WBC PRESENT, PREDOMINANTLY PMN NO ORGANISMS SEEN Performed at [facility]    Report Status 07/17/2016 FINAL   MRSA PCR Screening     Status: None   Collection Time: 07/17/16  6:35 PM  Result Value Ref Range   MRSA by PCR NEGATIVE NEGATIVE    Comment:        The GeneXpert MRSA Assay (FDA approved for NASAL specimens only), is one component of a comprehensive MRSA colonization surveillance program. It is not intended to diagnose MRSA infection nor to guide or monitor treatment for MRSA infections.   Glucose, capillary     Status: Abnormal   Collection Time: 07/17/16  9:03 PM  Result Value Ref Range   Glucose-Capillary 103 (H) 65 - 99 mg/dL   Comment 1 Notify RN    Comment 2 Document in Chart   Comprehensive metabolic panel     Status: Abnormal   Collection Time: 07/18/16  4:38 AM  Result Value Ref Range   Sodium 138 135 - 145 mmol/L   Potassium 4.2 3.5 - 5.1 mmol/L   Chloride 108 101 - 111 mmol/L   CO2 25 22 - 32 mmol/L   Glucose, Bld 77 65 - 99 mg/dL   BUN 7 6 - 20 mg/dL   Creatinine, Ser 0.75 0.44 - 1.00 mg/dL   Calcium 8.0 (L) 8.9 - 10.3 mg/dL   Total Protein 6.3 (L) 6.5 - 8.1 g/dL   Albumin 3.0 (L) 3.5 - 5.0 g/dL   AST 41 15 - 41 U/L   ALT 36 14 - 54 U/L   Alkaline Phosphatase 89 38 - 126 U/L   Total Bilirubin 1.1 0.3 - 1.2 mg/dL   GFR calc non Af Amer >60 >60 mL/min   GFR calc Af Amer >60 >60 mL/min    Comment: (NOTE) The eGFR has been calculated using the CKD EPI equation. This calculation has not been validated in all clinical situations. eGFR's persistently <60 mL/min signify possible Chronic Kidney Disease.    Anion gap 5 5 - 15  CBC with Differential/Platelet     Status: Abnormal   Collection Time: 07/18/16   4:38 AM  Result Value Ref Range   WBC 3.0 (L) 4.0 - 10.5 K/uL   RBC 3.69 (L) 3.87 - 5.11 MIL/uL   Hemoglobin 11.1 (L) 12.0 - 15.0 g/dL   HCT 33.4 (L) 36.0 - 46.0 %   MCV 90.5 78.0 - 100.0 fL   MCH 30.1 26.0 - 34.0 pg   MCHC 33.2 30.0 - 36.0 g/dL   RDW 13.8 11.5 - 15.5 %   Platelets 102 (L) 150 - 400 K/uL    Comment: SPECIMEN CHECKED FOR CLOTS PLATELET COUNT CONFIRMED BY SMEAR    Neutrophils Relative % 61 %   Neutro Abs 1.8 1.7 - 7.7 K/uL   Lymphocytes Relative 28 %   Lymphs Abs 0.8 0.7 - 4.0 K/uL   Monocytes Relative 9 %   Monocytes Absolute 0.3 0.1 - 1.0 K/uL   Eosinophils Relative 2 %   Eosinophils Absolute 0.1 0.0 - 0.7 K/uL   Basophils Relative 0 %   Basophils Absolute 0.0 0.0 - 0.1 K/uL  Glucose, capillary     Status: None   Collection Time: 07/18/16  7:25 AM  Result Value Ref Range   Glucose-Capillary 79 65 - 99 mg/dL   Comment 1 Document in Chart     ABGS No results for input(s): PHART, PO2ART, TCO2, HCO3 in the last 72 hours.  Invalid input(s): PCO2 CULTURES Recent Results (from the past 240 hour(s))  Blood Culture (routine x 2)     Status: None (Preliminary result)   Collection Time: 07/17/16  5:20 AM  Result Value Ref Range Status   Specimen Description BLOOD LEFT HAND  Final   Special Requests BOTTLES DRAWN AEROBIC AND ANAEROBIC 8 CC EACH  Final   Culture NO GROWTH < 12 HOURS  Final   Report Status PENDING  Incomplete  Blood Culture (routine x 2)     Status: None (Preliminary result)   Collection Time: 07/17/16  5:46 AM  Result Value Ref Range Status   Specimen Description BLOOD RIGHT ARM DRAWN BY RN    Final   Special Requests BOTTLES DRAWN AEROBIC AND ANAEROBIC 8 CC EACH  Final   Culture NO GROWTH < 12 HOURS  Final   Report Status PENDING  Incomplete  Culture, body fluid-bottle     Status: None (Preliminary result)   Collection Time: 07/17/16 10:07 AM  Result Value Ref Range Status   Specimen Description FLUID RIGHT PLEURAL COLLECTED BY DOCTOR  Final    Special Requests BOTTLES DRAWN AEROBIC AND ANAEROBIC 10CC EACH  Final   Culture NO GROWTH <12 HOURS  Final   Report Status PENDING  Incomplete  Gram stain     Status: None   Collection Time: 07/17/16 10:07 AM  Result Value Ref Range Status   Specimen Description FLUID RIGHT PLEURAL COLLECTED BY DOCTOR  Final   Special Requests NONE  Final   Gram Stain   Final    CYTOSPIN SMEAR WBC PRESENT, PREDOMINANTLY PMN NO ORGANISMS SEEN Performed at [facility]    Report Status 07/17/2016 FINAL  Final  MRSA PCR Screening     Status: None   Collection Time: 07/17/16  6:35 PM  Result Value Ref Range Status   MRSA by PCR NEGATIVE NEGATIVE Final    Comment:        The GeneXpert MRSA Assay (FDA approved for NASAL specimens only), is one component of a comprehensive MRSA colonization surveillance program. It is not intended to diagnose MRSA infection nor to guide or monitor treatment for MRSA infections.    Studies/Results: Dg Chest 1 View  Result Date: 07/17/2016 CLINICAL DATA:  Post RIGHT thoracentesis, fractured ribs and hemothorax after falling from a horse EXAM: CHEST 1 VIEW COMPARISON:  Expiratory exam compared to earlier study of 07/17/2016 FINDINGS: Residual pleural effusion and atelectasis at RIGHT base. No pneumothorax. Cardiac silhouette appears enlarged. Fractures of the RIGHT sixth seventh and eighth ribs identified. LEFT lung grossly clear. IMPRESSION: No pneumothorax following RIGHT thoracentesis. Findings discussed with patient. No immediate complications from thoracentesis. Electronically Signed   By: Mark  Boles M.D.   On: 07/17/2016 10:33   Dg Chest 2 View  Result Date: 07/17/2016 CLINICAL DATA:  Sudden onset shortness of breath. Elevated D-dimer. Recent right rib fractures after fall from horse. EXAM: RIGHT RIBS - 2 VIEW; CHEST - 2 VIEW COMPARISON:  Chest radiograph 06/25/2016 FINDINGS: There is shallow lung inflation. There is right lower lobe consolidation with a  medium-sized right pleural effusion. There is additional opacity in the parahilar right upper lobe is new compared to the prior study. The left lung is clear. Cardiomediastinal contours are unchanged. Minimally displaced fractures of the lateral right sixth, seventh and eighth ribs are again seen. IMPRESSION: 1. Suspected right middle and lower lobe collapse. 2. Medium-sized right pleural effusion. In the context of recent rib fractures, an element of hemothorax would be difficult to exclude. This could be further characterized with chest CT. 3. Minimally displaced fractures of the lateral right 6th-8th ribs, unchanged Electronically Signed   By: Kevin  Herman M.D.   On: 07/17/2016 06:38   Dg Ribs Unilateral Right  Result Date: 07/17/2016 CLINICAL DATA:  Sudden onset shortness of breath. Elevated D-dimer. Recent right rib fractures after fall from horse. EXAM: RIGHT RIBS - 2 VIEW; CHEST - 2 VIEW COMPARISON:  Chest radiograph 06/25/2016 FINDINGS: There is shallow lung inflation. There is right lower lobe consolidation with a medium-sized right pleural effusion. There is additional opacity in the parahilar right upper lobe is new compared to the prior study. The left lung is   clear. Cardiomediastinal contours are unchanged. Minimally displaced fractures of the lateral right sixth, seventh and eighth ribs are again seen. IMPRESSION: 1. Suspected right middle and lower lobe collapse. 2. Medium-sized right pleural effusion. In the context of recent rib fractures, an element of hemothorax would be difficult to exclude. This could be further characterized with chest CT. 3. Minimally displaced fractures of the lateral right 6th-8th ribs, unchanged Electronically Signed   By: Ulyses Jarred M.D.   On: 07/17/2016 06:38   Ct Chest Wo Contrast  Result Date: 07/17/2016 CLINICAL DATA:  Right-sided chest pain, fell off horse 06/25/2016, abnormal chest x-ray EXAM: CT CHEST WITHOUT CONTRAST TECHNIQUE: Multidetector CT imaging  of the chest was performed following the standard protocol without IV contrast. COMPARISON:  Radiograph 07/17/2016, prior chest CT 08/25/2014 FINDINGS: Cardiovascular: Limited without administration of intravenous contrast. Non contrasted aorta demonstrates grossly normal caliber. Minimal anterior pericardial thickening shows no significant change compared with previous CT scan. Trachea and mainstem bronchi are within normal limits. Mediastinum/Nodes: No significantly enlarged axillary lymph nodes. No definite mediastinal fluid collections evident. Stable 9 mm right paratracheal lymph node. Mild increased retrosternal lymph node measuring 9 mm. Slight increased lymph node adjacent to the aortic arch measuring 7 mm. Lungs/Pleura: Small focus of ground-glass density within the subpleural left upper lobe could relate to a focus of pneumonitis, atelectasis, or less likely small contusion given history of fall. Large right-sided pleural effusion with non simple Hounsfield units. Consolidation within the right upper lobe, right middle lobe and right lower lobe with air bronchograms. No pneumothorax identified. Upper Abdomen: Limited without intravenous contrast. Possible low-density fluid collection along the posterior aspect of the spleen. Elevated right diaphragm. Spleen is enlarged at 16 cm. Musculoskeletal: Acute displaced rib fractures involving the right sixth through ninth ribs. Mild wedge compression deformities at T8, T10, T11 and T12 suspected to be chronic as these appear present on 2015 examination. Mild subcutaneous soft tissue stranding and edema is present within the posterior lateral body wall. IMPRESSION: 1. Large right-sided pleural effusion with non simple density. There is associated partial consolidation in the right upper, right middle and right lower lobes with air bronchograms. Given the presence of acute right-sided rib fractures, findings could relate to the evolving hemo thorax. Given timing of  injury, multifocal pneumonia with parapneumonic effusion is also a consideration. No pneumothorax identified. 2. Slight interval increase in mediastinal lymph nodes. 3. Splenomegaly. Possible low-density fluid collection along the posterior aspect of the spleen; if splenic injury is suspected, contrasted CT examination may be obtained. 4. Small focus of ground-glass density within the subpleural left upper lobe could relate to atelectasis or focus of pneumonitis. These results will be called to the ordering clinician or representative by the Radiologist Assistant, and communication documented in the PACS or zVision Dashboard. Electronically Signed   By: Donavan Foil M.D.   On: 07/17/2016 08:50   Nm Pulmonary Perf And Vent  Result Date: 07/17/2016 CLINICAL DATA:  Shortness of breath over past 3 weeks, fell from a horse and fractured multiple RIGHT ribs, RIGHT hemothorax post thoracentesis, decreased oxygen saturation EXAM: NUCLEAR MEDICINE VENTILATION - PERFUSION LUNG SCAN TECHNIQUE: Ventilation images were obtained in multiple projections using inhaled aerosol [id number]. Perfusion images were obtained in multiple projections after intravenous injection of [id number]. Patient has an allergy to IV contrast material. RADIOPHARMACEUTICALS:  29.9 mCi Technetium-998mPA aerosol inhalation and 4.4 mCi Technetium-18m70m IV COMPARISON:  None Radiographic correlation:  Chest radiograph 07/17/2016 FINDINGS: Ventilation: Markedly diminished  ventilation in RIGHT lower lobe corresponding to the fusion and atelectasis on chest radiograph. Remaining ventilation normal. Perfusion: Matching diminished perfusion in RIGHT lower lobe. Remaining perfusion normal. Chest radiograph demonstrates RIGHT pleural effusion and basilar atelectasis. IMPRESSION: Matching diminished perfusion and ventilation in RIGHT lower lobe, corresponding to RIGHT pleural effusion and atelectasis on chest radiograph. By PIOPED II criteria, triple  matching radiographic, perfusion and ventilation abnormalities in the lower lung fields represent an intermediate probability for pulmonary embolism. However, no additional perfusion defects are identified. Electronically Signed   By: Lavonia Dana M.D.   On: 07/17/2016 11:36   US Thoracentesis Asp Pleural Space W/img Guide  Result Date: 07/17/2016 INDICATION: Multiple RIGHT rib fractures and hemothorax following fall from a horse a week ago EXAM: ULTRASOUND GUIDED DIAGNOSTIC AND THERAPEUTIC THORACENTESIS MEDICATIONS: None. COMPLICATIONS: None immediate. PROCEDURE: Procedure, benefits, and risks of procedure were discussed with patient. Written informed consent for procedure was obtained. Time out protocol followed. Pleural effusion localized by ultrasound at the posterior RIGHT hemithorax. Skin prepped and draped in usual sterile fashion. Skin and soft tissues anesthetized with 13 mL of 1% lidocaine. 8 French thoracentesis catheter placed into the RIGHT pleural space. 1.3 L of dark old bloody fluid aspirated by syringe pump. Procedure tolerated well by patient without immediate complication. RIGHT FINDINGS: A total of approximately 1.3 L of old bloody RIGHT pleural fluid fluid was removed. Samples were sent to the laboratory as requested by the clinical team. IMPRESSION: Successful ultrasound guided RIGHT thoracentesis yielding 1.3 L of pleural fluid. Electronically Signed   By: Lavonia Dana M.D.   On: 07/17/2016 10:38    Medications:  Prior to Admission:  Prescriptions Prior to Admission  Medication Sig Dispense Refill Last Dose  . clonazePAM (KLONOPIN) 1 MG tablet Take 1 mg by mouth 4 (four) times daily.    07/16/2016 at Unknown time  . etonogestrel (NEXPLANON) 68 MG IMPL implant 1 each by Subdermal route once.   current  . meclizine (ANTIVERT) 25 MG tablet Take 100 mg by mouth daily as needed for dizziness or nausea.   unknown  . metFORMIN (GLUCOPHAGE-XR) 500 MG 24 hr tablet Take 1,000 mg by mouth 2  (two) times daily.   07/16/2016 at Unknown time  . methadone (DOLOPHINE) 10 MG tablet Take 40 mg by mouth at bedtime.    07/16/2016 at Unknown time  . omeprazole (PRILOSEC) 20 MG capsule Take 20 mg by mouth daily.   07/16/2016 at Unknown time  . Suvorexant (BELSOMRA) 15 MG TABS Take 15 mg by mouth at bedtime.   07/16/2016 at Unknown time  . vortioxetine HBr (TRINTELLIX) 10 MG TABS Take 10 mg by mouth every evening.   07/16/2016 at Unknown time   Scheduled: . clonazePAM  1 mg Oral QID  . famotidine  20 mg Oral Daily  . Influenza vac split quadrivalent PF  0.5 mL Intramuscular Tomorrow-1000  . insulin aspart  0-15 Units Subcutaneous TID WC  . methadone  20 mg Oral Q12H  . piperacillin-tazobactam (ZOSYN)  IV  3.375 g Intravenous Q8H  . pneumococcal 23 valent vaccine  0.5 mL Intramuscular Tomorrow-1000  . vancomycin  1,250 mg Intravenous Q8H  . vortioxetine HBr  10 mg Oral QPM  . zolpidem  5 mg Oral QHS   Continuous: . sodium chloride 100 mL/hr at 07/18/16 0409   VVO:[id number], diphenhydrAMINE, ondansetron (ZOFRAN) IV, oxyCODONE-acetaminophen  Assesment: She was admitted with acute hypoxic respiratory failure with community-acquired pneumonia multiple rib fractures and a right hemothorax. She's  better but not well not ready for discharge. I don't think she needs to be in stepdown however Principal Problem:   Acute hypoxemic respiratory failure (HCC) Active Problems:   CAP (community acquired pneumonia)   Pleural effusion on right   Multiple rib fractures involving four or more ribs   Hemothorax on right   Chronic pain disorder    Plan: Transfer out of stepdown. I adjusted her medications. She will continue IV antibiotics. Continue incentive spirometry. Start sliding scale insulin. Start diabetic diet    LOS: 1 day   , L 07/18/2016, 8:20 AM  

## 2016-07-18 NOTE — Progress Notes (Signed)
Called report to Janace Aris, RN on dept 300. Verbalized understanding. Pt transferred to room 336 in safe and stable condition.

## 2016-07-19 LAB — GLUCOSE, CAPILLARY
GLUCOSE-CAPILLARY: 97 mg/dL (ref 65–99)
GLUCOSE-CAPILLARY: 106 mg/dL — AB (ref 65–99)
Glucose-Capillary: 98 mg/dL (ref 65–99)
Glucose-Capillary: 93 mg/dL (ref 65–99)

## 2016-07-19 LAB — HIV ANTIBODY (ROUTINE TESTING W REFLEX): HIV Screen 4th Generation wRfx: NONREACTIVE

## 2016-07-19 NOTE — Progress Notes (Signed)
Subjective: She feels better. Her pain is controlled with current medications. She's not coughing much. She is worried about her grandmother who is in the hospital in [place]  Objective: Vital signs in last 24 hours: Temp:  [98.1 F (36.7 C)-98.3 F (36.8 C)] 98.3 F (36.8 C) (09/17 0545) Pulse Rate:  [83-97] 97 (09/17 0545) Resp:  [18-22] 20 (09/17 0545) BP: (96-158)/(59-92) 143/92 (09/17 0545) SpO2:  [92 %-100 %] 97 % (09/17 0545) Weight:  [132.4 kg (291 lb 14.2 oz)] 132.4 kg (291 lb 14.2 oz) (09/17 0545) Weight change: 4.6 kg (10 lb 2.3 oz) Last BM Date: 07/16/16  Intake/Output from previous day: 09/16 0701 - 09/17 0700 In: 2125 [P.O.:960; I.V.:865; IV Piggyback:300] Out: -   PHYSICAL EXAM General appearance: alert, cooperative and morbidly obese Resp: clear to auscultation bilaterally Cardio: regular rate and rhythm, S1, S2 normal, no murmur, click, rub or gallop GI: soft, non-tender; bowel sounds normal; no masses,  no organomegaly Extremities: extremities normal, atraumatic, no cyanosis or edema  Lab Results:  Results for orders placed or performed during the hospital encounter of 07/17/16 (from the past 48 hour(s))  Lactate dehydrogenase (CSF, pleural or peritoneal fluid)     Status: Abnormal   Collection Time: 07/17/16 10:07 AM  Result Value Ref Range   LD, Fluid 139 (H) 3 - 23 U/[name]    Comment: (NOTE) Results should be evaluated in conjunction with serum values    Fluid Type-FLDH FLUID     Comment: RIGHT PLEURAL CORRECTED ON 09/15 AT 1104: PREVIOUSLY REPORTED AS Pleural R   Protein, pleural or peritoneal fluid     Status: None   Collection Time: 07/17/16 10:07 AM  Result Value Ref Range   Total protein, fluid 4.0 g/dL    Comment: (NOTE) No normal range established for this test Results should be evaluated in conjunction with serum values    Fluid Type-FTP FLUID     Comment: RIGHT PLEURAL CORRECTED ON 09/15 AT 1107: PREVIOUSLY REPORTED AS Pleural R    Body fluid cell count with differential     Status: Abnormal   Collection Time: 07/17/16 10:07 AM  Result Value Ref Range   Fluid Type-FCT FLUID     Comment: RIGHT PLEURAL CORRECTED ON 09/15 AT 1104: PREVIOUSLY REPORTED AS Pleural R    Color, Fluid RED (A) YELLOW   Appearance, Fluid TURBID (A) CLEAR   WBC, Fluid 5,448 (H) 0 - 1,000 cu mm   Neutrophil Count, Fluid 65 (H) 0 - 25 %   Lymphs, Fluid 33 %   Monocyte-Macrophage-Serous Fluid 2 ([name]) 50 - 90 %   Eos, Fluid 0 %   Other Cells, Fluid OTHER CELLS IDENTIFIED AS MESOTHELIAL CELLS %    Comment: OTHER CELLS UNIDENTIFIED; SEE CYTOLOGY REPORT  Glucose, pleural or peritoneal fluid     Status: None   Collection Time: 07/17/16 10:07 AM  Result Value Ref Range   Glucose, Fluid 105 mg/dL    Comment: (NOTE) No normal range established for this test Results should be evaluated in conjunction with serum values    Fluid Type-FGLU FLUID     Comment: RIGHT PLEURAL CORRECTED ON 09/15 AT 1112: PREVIOUSLY REPORTED AS Pleural R   Culture, body fluid-bottle     Status: None (Preliminary result)   Collection Time: 07/17/16 10:07 AM  Result Value Ref Range   Specimen Description FLUID RIGHT PLEURAL COLLECTED BY DOCTOR    Special Requests BOTTLES DRAWN AEROBIC AND ANAEROBIC 10CC EACH    Culture NO GROWTH <  12 HOURS    Report Status PENDING   Gram stain     Status: None   Collection Time: 07/17/16 10:07 AM  Result Value Ref Range   Specimen Description FLUID RIGHT PLEURAL COLLECTED BY DOCTOR    Special Requests NONE    Gram Stain      CYTOSPIN SMEAR WBC PRESENT, PREDOMINANTLY PMN NO ORGANISMS SEEN Performed at [facility]    Report Status 07/17/2016 FINAL   MRSA PCR Screening     Status: None   Collection Time: 07/17/16  6:35 PM  Result Value Ref Range   MRSA by PCR NEGATIVE NEGATIVE    Comment:        The GeneXpert MRSA Assay (FDA approved for NASAL specimens only), is one component of a comprehensive MRSA  colonization surveillance program. It is not intended to diagnose MRSA infection nor to guide or monitor treatment for MRSA infections.   Glucose, capillary     Status: Abnormal   Collection Time: 07/17/16  9:03 PM  Result Value Ref Range   Glucose-Capillary 103 (H) 65 - 99 mg/dL   Comment 1 Notify RN    Comment 2 Document in Chart   HIV antibody     Status: None   Collection Time: 07/18/16  4:38 AM  Result Value Ref Range   HIV Screen 4th Generation wRfx Non Reactive Non Reactive    Comment: (NOTE) Performed At: [facility] [place], [place] [id number] Lindon Romp MD [id number]   Comprehensive metabolic panel     Status: Abnormal   Collection Time: 07/18/16  4:38 AM  Result Value Ref Range   Sodium 138 135 - 145 mmol/[name]   Potassium 4.2 3.5 - 5.1 mmol/[name]   Chloride 108 101 - 111 mmol/[name]   CO2 25 22 - 32 mmol/[name]   Glucose, Bld 77 65 - 99 mg/dL   BUN 7 6 - 20 mg/dL   Creatinine, Ser 0.75 0.44 - 1.00 mg/dL   Calcium 8.0 ([name]) 8.9 - 10.3 mg/dL   Total Protein 6.3 ([name]) 6.5 - 8.1 g/dL   Albumin 3.0 ([name]) 3.5 - 5.0 g/dL   AST 41 15 - 41 U/[name]   ALT 36 14 - 54 U/[name]   Alkaline Phosphatase 89 38 - 126 U/[name]   Total Bilirubin 1.1 0.3 - 1.2 mg/dL   GFR calc non Af Amer >60 >60 mL/min   GFR calc Af Amer >60 >60 mL/min    Comment: (NOTE) The eGFR has been calculated using the CKD EPI equation. This calculation has not been validated in all clinical situations. eGFR's persistently <60 mL/min signify possible Chronic Kidney Disease.    Anion gap 5 5 - 15  CBC with Differential/Platelet     Status: Abnormal   Collection Time: 07/18/16  4:38 AM  Result Value Ref Range   WBC 3.0 ([name]) 4.0 - 10.5 K/uL   RBC 3.69 ([name]) 3.87 - 5.11 MIL/uL   Hemoglobin 11.1 ([name]) 12.0 - 15.0 g/dL   HCT 33.4 ([name]) 36.0 - 46.0 %   MCV 90.5 78.0 - 100.0 fL   MCH 30.1 26.0 - 34.0 pg   MCHC 33.2 30.0 - 36.0 g/dL   RDW 13.8 11.5 - 15.5 %   Platelets 102 ([name]) 150 - 400 K/uL    Comment: SPECIMEN  CHECKED FOR CLOTS PLATELET COUNT CONFIRMED BY SMEAR    Neutrophils Relative % 61 %   Neutro Abs 1.8 1.7 - 7.7 K/uL   Lymphocytes Relative 28 %  Lymphs Abs 0.8 0.7 - 4.0 K/uL   Monocytes Relative 9 %   Monocytes Absolute 0.3 0.1 - 1.0 K/uL   Eosinophils Relative 2 %   Eosinophils Absolute 0.1 0.0 - 0.7 K/uL   Basophils Relative 0 %   Basophils Absolute 0.0 0.0 - 0.1 K/uL  Glucose, capillary     Status: None   Collection Time: 07/18/16  7:25 AM  Result Value Ref Range   Glucose-Capillary 79 65 - 99 mg/dL   Comment 1 Document in Chart   Glucose, capillary     Status: Abnormal   Collection Time: 07/18/16 11:28 AM  Result Value Ref Range   Glucose-Capillary 101 (H) 65 - 99 mg/dL   Comment 1 Document in Chart   Glucose, capillary     Status: None   Collection Time: 07/18/16  8:44 PM  Result Value Ref Range   Glucose-Capillary 92 65 - 99 mg/dL   Comment 1 Notify RN    Comment 2 Document in Chart   Glucose, capillary     Status: None   Collection Time: 07/19/16  7:16 AM  Result Value Ref Range   Glucose-Capillary 98 65 - 99 mg/dL   Comment 1 Notify RN    Comment 2 Document in Chart     ABGS No results for input(s): PHART, PO2ART, TCO2, HCO3 in the last 72 hours.  Invalid input(s): PCO2 CULTURES Recent Results (from the past 240 hour(s))  Blood Culture (routine x 2)     Status: None (Preliminary result)   Collection Time: 07/17/16  5:20 AM  Result Value Ref Range Status   Specimen Description BLOOD LEFT HAND  Final   Special Requests BOTTLES DRAWN AEROBIC AND ANAEROBIC 8 CC EACH  Final   Culture NO GROWTH < 12 HOURS  Final   Report Status PENDING  Incomplete  Urine culture     Status: None   Collection Time: 07/17/16  5:31 AM  Result Value Ref Range Status   Specimen Description URINE, CLEAN CATCH  Final   Special Requests NONE  Final   Culture NO GROWTH Performed at [facility]   Final   Report Status 07/18/2016 FINAL  Final  Blood Culture (routine x 2)      Status: None (Preliminary result)   Collection Time: 07/17/16  5:46 AM  Result Value Ref Range Status   Specimen Description BLOOD RIGHT ARM DRAWN BY RN  Final   Special Requests BOTTLES DRAWN AEROBIC AND ANAEROBIC 8 CC EACH  Final   Culture NO GROWTH < 12 HOURS  Final   Report Status PENDING  Incomplete  Culture, body fluid-bottle     Status: None (Preliminary result)   Collection Time: 07/17/16 10:07 AM  Result Value Ref Range Status   Specimen Description FLUID RIGHT PLEURAL COLLECTED BY DOCTOR  Final   Special Requests BOTTLES DRAWN AEROBIC AND ANAEROBIC 10CC EACH  Final   Culture NO GROWTH <12 HOURS  Final   Report Status PENDING  Incomplete  Gram stain     Status: None   Collection Time: 07/17/16 10:07 AM  Result Value Ref Range Status   Specimen Description FLUID RIGHT PLEURAL COLLECTED BY DOCTOR  Final   Special Requests NONE  Final   Gram Stain   Final    CYTOSPIN SMEAR WBC PRESENT, PREDOMINANTLY PMN NO ORGANISMS SEEN Performed at [facility]    Report Status 07/17/2016 FINAL  Final  MRSA PCR Screening     Status: None   Collection  Time: 07/17/16  6:35 PM  Result Value Ref Range Status   MRSA by PCR NEGATIVE NEGATIVE Final    Comment:        The GeneXpert MRSA Assay (FDA approved for NASAL specimens only), is one component of a comprehensive MRSA colonization surveillance program. It is not intended to diagnose MRSA infection nor to guide or monitor treatment for MRSA infections.    Studies/Results: Dg Chest 1 View  Result Date: 07/17/2016 CLINICAL DATA:  Post RIGHT thoracentesis, fractured ribs and hemothorax after falling from a horse EXAM: CHEST 1 VIEW COMPARISON:  Expiratory exam compared to earlier study of 07/17/2016 FINDINGS: Residual pleural effusion and atelectasis at RIGHT base. No pneumothorax. Cardiac silhouette appears enlarged. Fractures of the RIGHT sixth seventh and eighth ribs identified. LEFT lung grossly clear. IMPRESSION: No pneumothorax  following RIGHT thoracentesis. Findings discussed with patient. No immediate complications from thoracentesis. Electronically Signed   By: Lavonia Dana M.D.   On: 07/17/2016 10:33   Nm Pulmonary Perf And Vent  Result Date: 07/17/2016 CLINICAL DATA:  Shortness of breath over past 3 weeks, fell from a horse and fractured multiple RIGHT ribs, RIGHT hemothorax post thoracentesis, decreased oxygen saturation EXAM: NUCLEAR MEDICINE VENTILATION - PERFUSION LUNG SCAN TECHNIQUE: Ventilation images were obtained in multiple projections using inhaled aerosol Tc-6mDTPA. Perfusion images were obtained in multiple projections after intravenous injection of Tc-927mAA. Patient has an allergy to IV contrast material. RADIOPHARMACEUTICALS:  29.9 mCi Technetium-992mPA aerosol inhalation and 4.4 mCi Technetium-26m67m IV COMPARISON:  None Radiographic correlation:  Chest radiograph 07/17/2016 FINDINGS: Ventilation: Markedly diminished ventilation in RIGHT lower lobe corresponding to the fusion and atelectasis on chest radiograph. Remaining ventilation normal. Perfusion: Matching diminished perfusion in RIGHT lower lobe. Remaining perfusion normal. Chest radiograph demonstrates RIGHT pleural effusion and basilar atelectasis. IMPRESSION: Matching diminished perfusion and ventilation in RIGHT lower lobe, corresponding to RIGHT pleural effusion and atelectasis on chest radiograph. By PIOPED II criteria, triple matching radiographic, perfusion and ventilation abnormalities in the lower lung fields represent an intermediate probability for pulmonary embolism. However, no additional perfusion defects are identified. Electronically Signed   By: MarkLavonia Dana.   On: 07/17/2016 11:36   Us TKorearacentesis Asp Pleural Space W/img Guide  Result Date: 07/17/2016 INDICATION: Multiple RIGHT rib fractures and hemothorax following fall from a horse a week ago EXAM: ULTRASOUND GUIDED DIAGNOSTIC AND THERAPEUTIC THORACENTESIS MEDICATIONS: None.  COMPLICATIONS: None immediate. PROCEDURE: Procedure, benefits, and risks of procedure were discussed with patient. Written informed consent for procedure was obtained. Time out protocol followed. Pleural effusion localized by ultrasound at the posterior RIGHT hemithorax. Skin prepped and draped in usual sterile fashion. Skin and soft tissues anesthetized with 13 mL of 1% lidocaine. 8 French thoracentesis catheter placed into the RIGHT pleural space. 1.3 [name] of dark old bloody fluid aspirated by syringe pump. Procedure tolerated well by patient without immediate complication. RIGHT FINDINGS: A total of approximately 1.3 [name] of old bloody RIGHT pleural fluid fluid was removed. Samples were sent to the laboratory as requested by the clinical team. IMPRESSION: Successful ultrasound guided RIGHT thoracentesis yielding 1.3 [name] of pleural fluid. Electronically Signed   By: MarkLavonia Dana.   On: 07/17/2016 10:38    Medications:  Prior to Admission:  Prescriptions Prior to Admission  Medication Sig Dispense Refill Last Dose  . clonazePAM (KLONOPIN) 1 MG tablet Take 1 mg by mouth 4 (four) times daily.    07/16/2016 at Unknown time  . etonogestrel (NEXPLANON) 68 MG IMPL  implant 1 each by Subdermal route once.   current  . meclizine (ANTIVERT) 25 MG tablet Take 100 mg by mouth daily as needed for dizziness or nausea.   unknown  . metFORMIN (GLUCOPHAGE-XR) 500 MG 24 hr tablet Take 1,000 mg by mouth 2 (two) times daily.   07/16/2016 at Unknown time  . methadone (DOLOPHINE) 10 MG tablet Take 40 mg by mouth at bedtime.    07/16/2016 at Unknown time  . omeprazole (PRILOSEC) 20 MG capsule Take 20 mg by mouth daily.   07/16/2016 at Unknown time  . Suvorexant (BELSOMRA) 15 MG TABS Take 15 mg by mouth at bedtime.   07/16/2016 at Unknown time  . vortioxetine HBr (TRINTELLIX) 10 MG TABS Take 10 mg by mouth every evening.   07/16/2016 at Unknown time   Scheduled: . clonazePAM  1 mg Oral QID  . famotidine  20 mg Oral Daily  .  insulin aspart  0-15 Units Subcutaneous TID WC  . methadone  20 mg Oral Q12H  . piperacillin-tazobactam (ZOSYN)  IV  3.375 g Intravenous Q8H  . vancomycin  1,250 mg Intravenous Q8H  . vortioxetine HBr  10 mg Oral QPM  . zolpidem  5 mg Oral QHS   Continuous: . sodium chloride 100 mL/hr at 07/18/16 1845   QVL:[id number], diphenhydrAMINE, ondansetron (ZOFRAN) IV, oxyCODONE-acetaminophen  Assesment: She was admitted with acute hypoxic respiratory failure from community-acquired pneumonia right hemothorax and multiple rib fractures on the right. She is improving. Her pain is controlled. Her breathing is doing okay. Principal Problem:   Acute hypoxemic respiratory failure (HCC) Active Problems:   CAP (community acquired pneumonia)   Pleural effusion on right   Multiple rib fractures involving four or more ribs   Hemothorax on right   Chronic pain disorder    Plan: Continue treatments. Potential discharge tomorrow. Try to get her up and moving    LOS: 2 days   Jaime [name] 07/19/2016, 9:29 AM

## 2016-07-20 LAB — GLUCOSE, CAPILLARY
GLUCOSE-CAPILLARY: 79 mg/dL (ref 65–99)
Glucose-Capillary: 104 mg/dL — ABNORMAL HIGH (ref 65–99)
Glucose-Capillary: 106 mg/dL — ABNORMAL HIGH (ref 65–99)

## 2016-07-20 LAB — PH, BODY FLUID: pH, Body Fluid: 7.2

## 2016-07-20 MED ORDER — METHADONE HCL 10 MG PO TABS: 20.0000 mg | ORAL_TABLET | Freq: Two times a day (BID) | ORAL | 0 refills | Status: DC

## 2016-07-20 MED ORDER — AMOXICILLIN-POT CLAVULANATE 875-125 MG PO TABS: 1.0000 | ORAL_TABLET | Freq: Two times a day (BID) | ORAL | 0 refills | Status: DC

## 2016-07-20 MED ORDER — OXYCODONE-ACETAMINOPHEN 5-325 MG PO TABS: 1.0000 | ORAL_TABLET | ORAL | 0 refills | Status: DC | PRN

## 2016-07-20 NOTE — Progress Notes (Signed)
Subjective: She feels better. She has no new complaints. Her pain is well controlled on current medications. This is more than she typically has to take as she has been reducing her pain medication over the last 2 years but she has acute rib fractures. She's not coughing much. She's not short of breath.  Objective: Vital signs in last 24 hours: Temp:  [97.9 F (36.6 C)-98.5 F (36.9 C)] 97.9 F (36.6 C) (09/18 0500) Pulse Rate:  [75-91] 75 (09/18 0500) Resp:  [18-20] 18 (09/18 0500) BP: (134-161)/(75-89) 154/86 (09/18 0500) SpO2:  [95 %-100 %] 100 % (09/18 0500) Weight change:  Last BM Date: 07/16/16  Intake/Output from previous day: 09/17 0701 - 09/18 0700 In: 5510 [P.O.:840; I.V.:3720; IV Piggyback:950] Out: -   PHYSICAL EXAM General appearance: alert, cooperative, mild distress and morbidly obese Resp: Diminished breath sounds bilaterally Cardio: regular rate and rhythm, S1, S2 normal, no murmur, click, rub or gallop GI: soft, non-tender; bowel sounds normal; no masses,  no organomegaly Extremities: extremities normal, atraumatic, no cyanosis or edema  Lab Results:  Results for orders placed or performed during the hospital encounter of 07/17/16 (from the past 48 hour(s))  Glucose, capillary     Status: Abnormal   Collection Time: 07/18/16 11:28 AM  Result Value Ref Range   Glucose-Capillary 101 (H) 65 - 99 mg/dL   Comment 1 Document in Chart   Glucose, capillary     Status: None   Collection Time: 07/18/16  8:44 PM  Result Value Ref Range   Glucose-Capillary 92 65 - 99 mg/dL   Comment 1 Notify RN    Comment 2 Document in Chart   Glucose, capillary     Status: None   Collection Time: 07/19/16  7:16 AM  Result Value Ref Range   Glucose-Capillary 98 65 - 99 mg/dL   Comment 1 Notify RN    Comment 2 Document in Chart   Glucose, capillary     Status: None   Collection Time: 07/19/16 11:24 AM  Result Value Ref Range   Glucose-Capillary 97 65 - 99 mg/dL   Comment 1  Notify RN    Comment 2 Document in Chart   Glucose, capillary     Status: None   Collection Time: 07/19/16  4:13 PM  Result Value Ref Range   Glucose-Capillary 93 65 - 99 mg/dL   Comment 1 Notify RN    Comment 2 Document in Chart   Glucose, capillary     Status: Abnormal   Collection Time: 07/19/16  9:22 PM  Result Value Ref Range   Glucose-Capillary 106 (H) 65 - 99 mg/dL   Comment 1 Notify RN    Comment 2 Document in Chart   Glucose, capillary     Status: None   Collection Time: 07/20/16  7:40 AM  Result Value Ref Range   Glucose-Capillary 79 65 - 99 mg/dL   Comment 1 Notify RN     ABGS No results for input(s): PHART, PO2ART, TCO2, HCO3 in the last 72 hours.  Invalid input(s): PCO2 CULTURES Recent Results (from the past 240 hour(s))  Blood Culture (routine x 2)     Status: None (Preliminary result)   Collection Time: 07/17/16  5:20 AM  Result Value Ref Range Status   Specimen Description BLOOD LEFT HAND  Final   Special Requests BOTTLES DRAWN AEROBIC AND ANAEROBIC 8 CC EACH  Final   Culture NO GROWTH < 12 HOURS  Final   Report Status PENDING  Incomplete  Urine culture     Status: None   Collection Time: 07/17/16  5:31 AM  Result Value Ref Range Status   Specimen Description URINE, CLEAN CATCH  Final   Special Requests NONE  Final   Culture NO GROWTH Performed at [facility]   Final   Report Status 07/18/2016 FINAL  Final  Blood Culture (routine x 2)     Status: None (Preliminary result)   Collection Time: 07/17/16  5:46 AM  Result Value Ref Range Status   Specimen Description BLOOD RIGHT ARM DRAWN BY RN  Final   Special Requests BOTTLES DRAWN AEROBIC AND ANAEROBIC 8 CC EACH  Final   Culture NO GROWTH < 12 HOURS  Final   Report Status PENDING  Incomplete  Culture, body fluid-bottle     Status: None (Preliminary result)   Collection Time: 07/17/16 10:07 AM  Result Value Ref Range Status   Specimen Description FLUID RIGHT PLEURAL COLLECTED BY DOCTOR  Final    Special Requests BOTTLES DRAWN AEROBIC AND ANAEROBIC 10CC EACH  Final   Culture NO GROWTH <12 HOURS  Final   Report Status PENDING  Incomplete  Gram stain     Status: None   Collection Time: 07/17/16 10:07 AM  Result Value Ref Range Status   Specimen Description FLUID RIGHT PLEURAL COLLECTED BY DOCTOR  Final   Special Requests NONE  Final   Gram Stain   Final    CYTOSPIN SMEAR WBC PRESENT, PREDOMINANTLY PMN NO ORGANISMS SEEN Performed at [facility]    Report Status 07/17/2016 FINAL  Final  MRSA PCR Screening     Status: None   Collection Time: 07/17/16  6:35 PM  Result Value Ref Range Status   MRSA by PCR NEGATIVE NEGATIVE Final    Comment:        The GeneXpert MRSA Assay (FDA approved for NASAL specimens only), is one component of a comprehensive MRSA colonization surveillance program. It is not intended to diagnose MRSA infection nor to guide or monitor treatment for MRSA infections.    Studies/Results: No results found.  Medications:  Prior to Admission:  Prescriptions Prior to Admission  Medication Sig Dispense Refill Last Dose  . clonazePAM (KLONOPIN) 1 MG tablet Take 1 mg by mouth 4 (four) times daily.    07/16/2016 at Unknown time  . etonogestrel (NEXPLANON) 68 MG IMPL implant 1 each by Subdermal route once.   current  . meclizine (ANTIVERT) 25 MG tablet Take 100 mg by mouth daily as needed for dizziness or nausea.   unknown  . metFORMIN (GLUCOPHAGE-XR) 500 MG 24 hr tablet Take 1,000 mg by mouth 2 (two) times daily.   07/16/2016 at Unknown time  . omeprazole (PRILOSEC) 20 MG capsule Take 20 mg by mouth daily.   07/16/2016 at Unknown time  . Suvorexant (BELSOMRA) 15 MG TABS Take 15 mg by mouth at bedtime.   07/16/2016 at Unknown time  . vortioxetine HBr (TRINTELLIX) 10 MG TABS Take 10 mg by mouth every evening.   07/16/2016 at Unknown time  . [DISCONTINUED] methadone (DOLOPHINE) 10 MG tablet Take 40 mg by mouth at bedtime.    07/16/2016 at Unknown time    Scheduled: . clonazePAM  1 mg Oral QID  . famotidine  20 mg Oral Daily  . insulin aspart  0-15 Units Subcutaneous TID WC  . methadone  20 mg Oral Q12H  . piperacillin-tazobactam (ZOSYN)  IV  3.375 g Intravenous Q8H  . vancomycin  1,250 mg Intravenous Q8H  .  vortioxetine HBr  10 mg Oral QPM  . zolpidem  5 mg Oral QHS   Continuous: . sodium chloride 100 mL/hr at 07/19/16 2216   TXM:[id number], diphenhydrAMINE, ondansetron (ZOFRAN) IV, oxyCODONE-acetaminophen  Assesment: She was admitted with acute hypoxic respiratory failure. She had community-acquired pneumonia and a large right pleural effusion. All this is related to a fall with multiple rib fractures. She had hemothorax and had thoracentesis and has improved. She is ready for discharge. Principal Problem:   Acute hypoxemic respiratory failure (HCC) Active Problems:   CAP (community acquired pneumonia)   Pleural effusion on right   Multiple rib fractures involving four or more ribs   Hemothorax on right   Chronic pain disorder    Plan: Discharge home today please see discharge summary for details    LOS: 3 days   Jaime [name] L 07/20/2016, 8:43 AM

## 2016-07-20 NOTE — Discharge Summary (Addendum)
Physician Discharge Summary  Patient ID: Jaime [name] MRN: [id number] DOB/AGE: 32/17/1987 32 y.o. Primary Care Physician:[name], MD Admit date: 07/17/2016 Discharge date: 07/20/2016    Discharge Diagnoses:   Principal Problem:   Acute hypoxemic respiratory failure ([place]) Active Problems:   CAP (community acquired pneumonia)   Pleural effusion on right   Multiple rib fractures involving four or more ribs   Hemothorax on right   Chronic pain disorder  sepsis   Medication List    STOP taking these medications   HYDROcodone-acetaminophen 5-325 MG tablet Commonly known as:  NORCO/VICODIN     TAKE these medications   amoxicillin-clavulanate 875-125 MG tablet Commonly known as:  AUGMENTIN Take 1 tablet by mouth 2 (two) times daily.   BELSOMRA 15 MG Tabs Generic drug:  Suvorexant Take 15 mg by mouth at bedtime.   clonazePAM 1 MG tablet Commonly known as:  KLONOPIN Take 1 mg by mouth 4 (four) times daily.   meclizine 25 MG tablet Commonly known as:  ANTIVERT Take 100 mg by mouth daily as needed for dizziness or nausea.   metFORMIN 500 MG 24 hr tablet Commonly known as:  GLUCOPHAGE-XR Take 1,000 mg by mouth 2 (two) times daily.   methadone 10 MG tablet Commonly known as:  DOLOPHINE Take 2 tablets (20 mg total) by mouth every 12 (twelve) hours. What changed:  how much to take  when to take this   NEXPLANON 68 MG Impl implant Generic drug:  etonogestrel 1 each by Subdermal route once.   omeprazole 20 MG capsule Commonly known as:  PRILOSEC Take 20 mg by mouth daily.   oxyCODONE-acetaminophen 5-325 MG tablet Commonly known as:  PERCOCET/ROXICET Take 1 tablet by mouth every 4 (four) hours as needed for moderate pain.   TRINTELLIX 10 MG Tabs Generic drug:  vortioxetine HBr Take 10 mg by mouth every evening.       Discharged Condition:Improved    Consults: Radiology Dr. Tressie Ellis  Significant Diagnostic Studies: Ct Abdomen Pelvis Wo  Contrast  Result Date: 06/22/2016 CLINICAL DATA:  Right lower quadrant abdominal pain, nausea and vomiting since yesterday. EXAM: CT ABDOMEN AND PELVIS WITHOUT CONTRAST TECHNIQUE: Multidetector CT imaging of the abdomen and pelvis was performed following the standard protocol without IV contrast. COMPARISON:  10/07/2013. FINDINGS: Lower chest:  Clear lung bases. Hepatobiliary: Diffuse low density of the liver relative to the spleen. Cholecystectomy clips. Pancreas: Diffuse atrophy with partial fatty replacement. Spleen: Enlarged, measuring 2.2 cm in length. Adrenals/Urinary Tract: Normal appearing adrenal glands, kidneys, ureters and urinary bladder. No calculi or hydronephrosis. No visible mass. Stomach/Bowel: Prominent stool in the descending and rectosigmoid colon. Unremarkable stomach and small bowel. Normal appearing appendix. Vascular/Lymphatic: The previously demonstrated 21 mm short axis portacaval node has a short axis diameter of 13 mm today on image number 32. A gastrohepatic ligament lymph node currently has a short axis diameter of 21 mm on image number 28, previously 20 mm. No vascular calcifications are aneurysm. Reproductive: No mass or other significant abnormality. Other: Tiny umbilical and supraumbilical hernias containing fat. Musculoskeletal: Lumbar and lower thoracic spine degenerative changes. IMPRESSION: 1. Progressive splenomegaly. 2. Mild diffuse hepatic steatosis. 3. Mildly improved upper abdominal adenopathy. Electronically Signed   By: Claudie Revering M.D.   On: 06/22/2016 19:24   Dg Chest 1 View  Result Date: 07/17/2016 CLINICAL DATA:  Post RIGHT thoracentesis, fractured ribs and hemothorax after falling from a horse EXAM: CHEST 1 VIEW COMPARISON:  Expiratory exam compared to earlier study of 07/17/2016  FINDINGS: Residual pleural effusion and atelectasis at RIGHT base. No pneumothorax. Cardiac silhouette appears enlarged. Fractures of the RIGHT sixth seventh and eighth ribs  identified. LEFT lung grossly clear. IMPRESSION: No pneumothorax following RIGHT thoracentesis. Findings discussed with patient. No immediate complications from thoracentesis. Electronically Signed   By: Lavonia Dana M.D.   On: 07/17/2016 10:33   Dg Chest 2 View  Result Date: 07/17/2016 CLINICAL DATA:  Sudden onset shortness of breath. Elevated D-dimer. Recent right rib fractures after fall from horse. EXAM: RIGHT RIBS - 2 VIEW; CHEST - 2 VIEW COMPARISON:  Chest radiograph 06/25/2016 FINDINGS: There is shallow lung inflation. There is right lower lobe consolidation with a medium-sized right pleural effusion. There is additional opacity in the parahilar right upper lobe is new compared to the prior study. The left lung is clear. Cardiomediastinal contours are unchanged. Minimally displaced fractures of the lateral right sixth, seventh and eighth ribs are again seen. IMPRESSION: 1. Suspected right middle and lower lobe collapse. 2. Medium-sized right pleural effusion. In the context of recent rib fractures, an element of hemothorax would be difficult to exclude. This could be further characterized with chest CT. 3. Minimally displaced fractures of the lateral right 6th-8th ribs, unchanged Electronically Signed   By: Ulyses Jarred M.D.   On: 07/17/2016 06:38   Dg Ribs Unilateral Right  Result Date: 07/17/2016 CLINICAL DATA:  Sudden onset shortness of breath. Elevated D-dimer. Recent right rib fractures after fall from horse. EXAM: RIGHT RIBS - 2 VIEW; CHEST - 2 VIEW COMPARISON:  Chest radiograph 06/25/2016 FINDINGS: There is shallow lung inflation. There is right lower lobe consolidation with a medium-sized right pleural effusion. There is additional opacity in the parahilar right upper lobe is new compared to the prior study. The left lung is clear. Cardiomediastinal contours are unchanged. Minimally displaced fractures of the lateral right sixth, seventh and eighth ribs are again seen. IMPRESSION: 1. Suspected  right middle and lower lobe collapse. 2. Medium-sized right pleural effusion. In the context of recent rib fractures, an element of hemothorax would be difficult to exclude. This could be further characterized with chest CT. 3. Minimally displaced fractures of the lateral right 6th-8th ribs, unchanged Electronically Signed   By: Ulyses Jarred M.D.   On: 07/17/2016 06:38   Dg Ribs Unilateral W/chest Right  Result Date: 06/25/2016 CLINICAL DATA:  Right lateral rib pain and back pain after fall. EXAM: RIGHT RIBS AND CHEST - 3+ VIEW COMPARISON:  August 03, 2014 FINDINGS: Low lung volumes limit evaluation of the lungs. No pneumothorax identified. The heart, hila, and mediastinum are unremarkable. No pulmonary nodules, masses, or focal infiltrates. There is a nondisplaced fracture through the lateral right eighth rib. Fractures are also seen through the posterior lateral sixth and seventh ribs on the right. IMPRESSION: Nondisplaced fractures through the right lateral sixth, seventh, and eighth ribs with no pneumothorax. Electronically Signed   By: Dorise Bullion III M.D   On: 06/25/2016 20:31   Dg Lumbar Spine Complete  Result Date: 06/25/2016 CLINICAL DATA:  Right lateral rib pain and lower back pain today following fall off of horse today. Pt stated she fell off of her horse today on to the ground No previous injuries EXAM: LUMBAR SPINE - COMPLETE 4+ VIEW COMPARISON:  CT, 06/22/2016 FINDINGS: No fracture or spondylolisthesis. Mild loss of disc height from L2-L3 through L4-L5. Small endplate osteophytes most evident at L2-L3. Facet joints are well preserved. Soft tissues are unremarkable. IMPRESSION: 1. No fracture or acute finding.  2. Mild degenerative changes. Electronically Signed   By: Lajean Manes M.D.   On: 06/25/2016 20:32   Ct Chest Wo Contrast  Result Date: 07/17/2016 CLINICAL DATA:  Right-sided chest pain, fell off horse 06/25/2016, abnormal chest x-ray EXAM: CT CHEST WITHOUT CONTRAST TECHNIQUE:  Multidetector CT imaging of the chest was performed following the standard protocol without IV contrast. COMPARISON:  Radiograph 07/17/2016, prior chest CT 08/25/2014 FINDINGS: Cardiovascular: Limited without administration of intravenous contrast. Non contrasted aorta demonstrates grossly normal caliber. Minimal anterior pericardial thickening shows no significant change compared with previous CT scan. Trachea and mainstem bronchi are within normal limits. Mediastinum/Nodes: No significantly enlarged axillary lymph nodes. No definite mediastinal fluid collections evident. Stable 9 mm right paratracheal lymph node. Mild increased retrosternal lymph node measuring 9 mm. Slight increased lymph node adjacent to the aortic arch measuring 7 mm. Lungs/Pleura: Small focus of ground-glass density within the subpleural left upper lobe could relate to a focus of pneumonitis, atelectasis, or less likely small contusion given history of fall. Large right-sided pleural effusion with non simple Hounsfield units. Consolidation within the right upper lobe, right middle lobe and right lower lobe with air bronchograms. No pneumothorax identified. Upper Abdomen: Limited without intravenous contrast. Possible low-density fluid collection along the posterior aspect of the spleen. Elevated right diaphragm. Spleen is enlarged at 16 cm. Musculoskeletal: Acute displaced rib fractures involving the right sixth through ninth ribs. Mild wedge compression deformities at T8, T10, T11 and T12 suspected to be chronic as these appear present on 2015 examination. Mild subcutaneous soft tissue stranding and edema is present within the posterior lateral body wall. IMPRESSION: 1. Large right-sided pleural effusion with non simple density. There is associated partial consolidation in the right upper, right middle and right lower lobes with air bronchograms. Given the presence of acute right-sided rib fractures, findings could relate to the evolving hemo  thorax. Given timing of injury, multifocal pneumonia with parapneumonic effusion is also a consideration. No pneumothorax identified. 2. Slight interval increase in mediastinal lymph nodes. 3. Splenomegaly. Possible low-density fluid collection along the posterior aspect of the spleen; if splenic injury is suspected, contrasted CT examination may be obtained. 4. Small focus of ground-glass density within the subpleural left upper lobe could relate to atelectasis or focus of pneumonitis. These results will be called to the ordering clinician or representative by the Radiologist Assistant, and communication documented in the PACS or zVision Dashboard. Electronically Signed   By: Donavan Foil M.D.   On: 07/17/2016 08:50   Nm Pulmonary Perf And Vent  Result Date: 07/17/2016 CLINICAL DATA:  Shortness of breath over past 3 weeks, fell from a horse and fractured multiple RIGHT ribs, RIGHT hemothorax post thoracentesis, decreased oxygen saturation EXAM: NUCLEAR MEDICINE VENTILATION - PERFUSION LUNG SCAN TECHNIQUE: Ventilation images were obtained in multiple projections using inhaled aerosol [id number]. Perfusion images were obtained in multiple projections after intravenous injection of Tc-967mAA. Patient has an allergy to IV contrast material. RADIOPHARMACEUTICALS:  29.9 mCi Technetium-9917mPA aerosol inhalation and 4.4 mCi Technetium-72m66m IV COMPARISON:  None Radiographic correlation:  Chest radiograph 07/17/2016 FINDINGS: Ventilation: Markedly diminished ventilation in RIGHT lower lobe corresponding to the fusion and atelectasis on chest radiograph. Remaining ventilation normal. Perfusion: Matching diminished perfusion in RIGHT lower lobe. Remaining perfusion normal. Chest radiograph demonstrates RIGHT pleural effusion and basilar atelectasis. IMPRESSION: Matching diminished perfusion and ventilation in RIGHT lower lobe, corresponding to RIGHT pleural effusion and atelectasis on chest radiograph. By PIOPED II  criteria, triple matching radiographic,  perfusion and ventilation abnormalities in the lower lung fields represent an intermediate probability for pulmonary embolism. However, no additional perfusion defects are identified. Electronically Signed   By: Lavonia Dana M.D.   On: 07/17/2016 11:36   US Venous Img Lower Bilateral  Result Date: 07/10/2016 CLINICAL DATA:  30 year old female with bilateral lower extremity swelling EXAM: BILATERAL LOWER EXTREMITY VENOUS DOPPLER ULTRASOUND TECHNIQUE: Gray-scale sonography with graded compression, as well as color Doppler and duplex ultrasound were performed to evaluate the lower extremity deep venous systems from the level of the common femoral vein and including the common femoral, femoral, profunda femoral, popliteal and calf veins including the posterior tibial, peroneal and gastrocnemius veins when visible. The superficial great saphenous vein was also interrogated. Spectral Doppler was utilized to evaluate flow at rest and with distal augmentation maneuvers in the common femoral, femoral and popliteal veins. COMPARISON:  None. FINDINGS: RIGHT LOWER EXTREMITY Common Femoral Vein: No evidence of thrombus. Normal compressibility, respiratory phasicity and response to augmentation. Saphenofemoral Junction: No evidence of thrombus. Normal compressibility and flow on color Doppler imaging. Profunda Femoral Vein: No evidence of thrombus. Normal compressibility and flow on color Doppler imaging. Femoral Vein: No evidence of thrombus. Normal compressibility, respiratory phasicity and response to augmentation. Popliteal Vein: No evidence of thrombus. Normal compressibility, respiratory phasicity and response to augmentation. Calf Veins: No evidence of thrombus. Normal compressibility and flow on color Doppler imaging. Superficial Great Saphenous Vein: No evidence of thrombus. Normal compressibility and flow on color Doppler imaging. Venous Reflux:  None. Other Findings:  None.  LEFT LOWER EXTREMITY Common Femoral Vein: No evidence of thrombus. Normal compressibility, respiratory phasicity and response to augmentation. Saphenofemoral Junction: No evidence of thrombus. Normal compressibility and flow on color Doppler imaging. Profunda Femoral Vein: No evidence of thrombus. Normal compressibility and flow on color Doppler imaging. Femoral Vein: No evidence of thrombus. Normal compressibility, respiratory phasicity and response to augmentation. Popliteal Vein: No evidence of thrombus. Normal compressibility, respiratory phasicity and response to augmentation. Calf Veins: No evidence of thrombus. Normal compressibility and flow on color Doppler imaging. Superficial Great Saphenous Vein: No evidence of thrombus. Normal compressibility and flow on color Doppler imaging. Venous Reflux:  None. Other Findings:  None. IMPRESSION: No evidence of deep venous thrombosis. Electronically Signed   By: Jacqulynn Cadet M.D.   On: 07/10/2016 17:01   US Thoracentesis Asp Pleural Space W/img Guide  Result Date: 07/17/2016 INDICATION: Multiple RIGHT rib fractures and hemothorax following fall from a horse a week ago EXAM: ULTRASOUND GUIDED DIAGNOSTIC AND THERAPEUTIC THORACENTESIS MEDICATIONS: None. COMPLICATIONS: None immediate. PROCEDURE: Procedure, benefits, and risks of procedure were discussed with patient. Written informed consent for procedure was obtained. Time out protocol followed. Pleural effusion localized by ultrasound at the posterior RIGHT hemithorax. Skin prepped and draped in usual sterile fashion. Skin and soft tissues anesthetized with 13 mL of 1% lidocaine. 8 French thoracentesis catheter placed into the RIGHT pleural space. 1.3 L of dark old bloody fluid aspirated by syringe pump. Procedure tolerated well by patient without immediate complication. RIGHT FINDINGS: A total of approximately 1.3 L of old bloody RIGHT pleural fluid fluid was removed. Samples were sent to the laboratory as  requested by the clinical team. IMPRESSION: Successful ultrasound guided RIGHT thoracentesis yielding 1.3 L of pleural fluid. Electronically Signed   By: Lavonia Dana M.D.   On: 07/17/2016 10:38    Lab Results: Basic Metabolic Panel:  Recent Labs  07/18/16 0438  NA 138  K 4.2  CL 108  CO2 25  GLUCOSE 77  BUN 7  CREATININE 0.75  CALCIUM 8.0*   Liver Function Tests:  Recent Labs  07/18/16 0438  AST 41  ALT 36  ALKPHOS 89  BILITOT 1.1  PROT 6.3*  ALBUMIN 3.0*     CBC:  Recent Labs  07/18/16 0438  WBC 3.0*  NEUTROABS 1.8  HGB 11.1*  HCT 33.4*  MCV 90.5  PLT 102*    Recent Results (from the past 240 hour(s))  Blood Culture (routine x 2)     Status: None (Preliminary result)   Collection Time: 07/17/16  5:20 AM  Result Value Ref Range Status   Specimen Description BLOOD LEFT HAND  Final   Special Requests BOTTLES DRAWN AEROBIC AND ANAEROBIC 8 CC EACH  Final   Culture NO GROWTH < 12 HOURS  Final   Report Status PENDING  Incomplete  Urine culture     Status: None   Collection Time: 07/17/16  5:31 AM  Result Value Ref Range Status   Specimen Description URINE, CLEAN CATCH  Final   Special Requests NONE  Final   Culture NO GROWTH Performed at [facility]   Final   Report Status 07/18/2016 FINAL  Final  Blood Culture (routine x 2)     Status: None (Preliminary result)   Collection Time: 07/17/16  5:46 AM  Result Value Ref Range Status   Specimen Description BLOOD RIGHT ARM DRAWN BY RN  Final   Special Requests BOTTLES DRAWN AEROBIC AND ANAEROBIC 8 CC EACH  Final   Culture NO GROWTH < 12 HOURS  Final   Report Status PENDING  Incomplete  Culture, body fluid-bottle     Status: None (Preliminary result)   Collection Time: 07/17/16 10:07 AM  Result Value Ref Range Status   Specimen Description FLUID RIGHT PLEURAL COLLECTED BY DOCTOR  Final   Special Requests BOTTLES DRAWN AEROBIC AND ANAEROBIC 10CC EACH  Final   Culture NO GROWTH <12 HOURS  Final    Report Status PENDING  Incomplete  Gram stain     Status: None   Collection Time: 07/17/16 10:07 AM  Result Value Ref Range Status   Specimen Description FLUID RIGHT PLEURAL COLLECTED BY DOCTOR  Final   Special Requests NONE  Final   Gram Stain   Final    CYTOSPIN SMEAR WBC PRESENT, PREDOMINANTLY PMN NO ORGANISMS SEEN Performed at [facility]    Report Status 07/17/2016 FINAL  Final  MRSA PCR Screening     Status: None   Collection Time: 07/17/16  6:35 PM  Result Value Ref Range Status   MRSA by PCR NEGATIVE NEGATIVE Final    Comment:        The GeneXpert MRSA Assay (FDA approved for NASAL specimens only), is one component of a comprehensive MRSA colonization surveillance program. It is not intended to diagnose MRSA infection nor to guide or monitor treatment for MRSA infections.      Hospital Course: This is a 30 year old who fell at home and struck the right side of her chest. She's been having increasing problems with chest pain. She became more short of breath and presented to the emergency department where she was found to have what appeared to be pneumonia and a large right pleural effusion. She underwent thoracentesis and it was found to be bloody from her rib fractures. She had rib fractures on the right. She was treated for pneumonia. She improved over the next several days to the point that she  was ready for discharge.She did appear to be mildly septic early in her hospital course but that improved markedly after thoracentesis  Discharge Exam: Blood pressure (!) 154/86, pulse 75, temperature 97.9 F (36.6 C), temperature source Oral, resp. rate 18, height 5' 9"  (1.753 m), weight 132.4 kg (291 lb 14.2 oz), SpO2 100 %. She is awake and alert. She looks much more comfortable. She still has some diminished breath sounds bilaterally  Disposition: Home she will follow in my office    Follow-up Information    Schedule an appointment as soon as possible for a visit   with Jaeleigh Monaco L, MD.   Specialty:  Pulmonary Disease Contact information: [place] [address] [phone]           Signed: Toneshia Coello L   07/20/2016, 8:45 AM

## 2016-07-20 NOTE — Progress Notes (Signed)
AVS reviewed with patient.  Patient verbalized understanding of discharge instructions, physician follow-up, medications.  Patient's IVs (2) removed.  Site WNL.  Patient reports belongings intact and in possession.  Prescription for pain medication given to patient by Dr. Luan Pulling.  Patient stable and awaiting ride for discharge home.

## 2016-07-21 LAB — LEGIONELLA PNEUMOPHILA SEROGP 1 UR AG: L. PNEUMOPHILA SEROGP 1 UR AG: NEGATIVE

## 2016-07-22 LAB — CULTURE, BLOOD (ROUTINE X 2)
Culture: NO GROWTH
Culture: NO GROWTH

## 2016-07-22 LAB — CULTURE, BODY FLUID W GRAM STAIN -BOTTLE: Culture: NO GROWTH

## 2016-07-22 LAB — CULTURE, BODY FLUID-BOTTLE

## 2017-08-12 ENCOUNTER — Other Ambulatory Visit (HOSPITAL_COMMUNITY): Payer: Self-pay | Admitting: Pulmonary Disease

## 2017-08-12 DIAGNOSIS — B182 Chronic viral hepatitis C: Secondary | ICD-10-CM

## 2017-08-18 ENCOUNTER — Other Ambulatory Visit (HOSPITAL_COMMUNITY): Payer: Self-pay | Admitting: Pulmonary Disease

## 2017-08-18 ENCOUNTER — Ambulatory Visit (HOSPITAL_COMMUNITY)
Admission: RE | Admit: 2017-08-18 | Discharge: 2017-08-18 | Disposition: A | Discharge status: Home / Self Care | Payer: Self-pay | Source: Ambulatory Visit | Attending: Pulmonary Disease | Admitting: Pulmonary Disease

## 2017-08-18 DIAGNOSIS — M545 Low back pain: Secondary | ICD-10-CM | POA: Insufficient documentation

## 2017-08-18 DIAGNOSIS — B182 Chronic viral hepatitis C: Secondary | ICD-10-CM | POA: Insufficient documentation

## 2017-08-18 DIAGNOSIS — M2578 Osteophyte, vertebrae: Secondary | ICD-10-CM | POA: Insufficient documentation

## 2017-08-18 DIAGNOSIS — G8929 Other chronic pain: Secondary | ICD-10-CM

## 2017-08-18 DIAGNOSIS — M48061 Spinal stenosis, lumbar region without neurogenic claudication: Secondary | ICD-10-CM | POA: Insufficient documentation

## 2017-08-18 DIAGNOSIS — Z9049 Acquired absence of other specified parts of digestive tract: Secondary | ICD-10-CM | POA: Insufficient documentation

## 2018-04-10 ENCOUNTER — Other Ambulatory Visit: Payer: Self-pay

## 2018-04-10 ENCOUNTER — Emergency Department (HOSPITAL_COMMUNITY)
Admission: EM | Admit: 2018-04-10 | Discharge: 2018-04-10 | Disposition: A | Discharge status: Home / Self Care | Payer: Self-pay | Attending: Emergency Medicine | Admitting: Emergency Medicine

## 2018-04-10 ENCOUNTER — Encounter (HOSPITAL_COMMUNITY): Payer: Self-pay | Admitting: *Deleted

## 2018-04-10 DIAGNOSIS — E119 Type 2 diabetes mellitus without complications: Secondary | ICD-10-CM | POA: Insufficient documentation

## 2018-04-10 DIAGNOSIS — Z79899 Other long term (current) drug therapy: Secondary | ICD-10-CM | POA: Insufficient documentation

## 2018-04-10 DIAGNOSIS — R1011 Right upper quadrant pain: Secondary | ICD-10-CM | POA: Insufficient documentation

## 2018-04-10 DIAGNOSIS — Z7984 Long term (current) use of oral hypoglycemic drugs: Secondary | ICD-10-CM | POA: Insufficient documentation

## 2018-04-10 DIAGNOSIS — Z87891 Personal history of nicotine dependence: Secondary | ICD-10-CM | POA: Insufficient documentation

## 2018-04-10 DIAGNOSIS — D696 Thrombocytopenia, unspecified: Secondary | ICD-10-CM | POA: Insufficient documentation

## 2018-04-10 HISTORY — DX: Major depressive disorder, single episode, unspecified: F32.9

## 2018-04-10 HISTORY — DX: Depression, unspecified: F32.A

## 2018-04-10 HISTORY — DX: Unspecified cirrhosis of liver: K74.60

## 2018-04-10 HISTORY — DX: Anxiety disorder, unspecified: F41.9

## 2018-04-10 LAB — COMPREHENSIVE METABOLIC PANEL
ALT: 47 U/L (ref 14–54)
AST: 47 U/L — ABNORMAL HIGH (ref 15–41)
Albumin: 3.8 g/dL (ref 3.5–5.0)
Alkaline Phosphatase: 86 U/L (ref 38–126)
Anion gap: 9 (ref 5–15)
BUN: 7 mg/dL (ref 6–20)
CHLORIDE: 103 mmol/L (ref 101–111)
CO2: 27 mmol/L (ref 22–32)
CREATININE: 0.49 mg/dL (ref 0.44–1.00)
Calcium: 9 mg/dL (ref 8.9–10.3)
GFR calc Af Amer: >60 mL/min (ref >60)
GFR calc non Af Amer: >60 mL/min (ref >60)
GLUCOSE: 157 mg/dL — AB (ref 65–99)
Potassium: 3.9 mmol/L (ref 3.5–5.1)
Sodium: 139 mmol/L (ref 135–145)
Total Bilirubin: 0.7 mg/dL (ref 0.3–1.2)
Total Protein: 8 g/dL (ref 6.5–8.1)

## 2018-04-10 LAB — URINALYSIS, ROUTINE W REFLEX MICROSCOPIC
Bilirubin Urine: NEGATIVE
GLUCOSE, UA: NEGATIVE mg/dL
HGB URINE DIPSTICK: NEGATIVE
Ketones, ur: NEGATIVE mg/dL
Leukocytes, UA: NEGATIVE
Nitrite: NEGATIVE
PROTEIN: NEGATIVE mg/dL
Specific Gravity, Urine: 1.02 (ref 1.005–1.030)
pH: 6 (ref 5.0–8.0)

## 2018-04-10 LAB — CBC
HCT: 38.6 % (ref 36.0–46.0)
Hemoglobin: 12.8 g/dL (ref 12.0–15.0)
MCH: 29.4 pg (ref 26.0–34.0)
MCHC: 33.2 g/dL (ref 30.0–36.0)
MCV: 88.7 fL (ref 78.0–100.0)
PLATELETS: 73 10*3/uL — AB (ref 150–400)
RBC: 4.35 MIL/uL (ref 3.87–5.11)
RDW: 13.5 % (ref 11.5–15.5)
WBC: 3.6 10*3/uL — ABNORMAL LOW (ref 4.0–10.5)

## 2018-04-10 LAB — LIPASE, BLOOD: LIPASE: 23 U/L (ref 11–51)

## 2018-04-10 LAB — PREGNANCY, URINE: Preg Test, Ur: NEGATIVE

## 2018-04-10 MED ORDER — SODIUM CHLORIDE 0.9 % IV BOLUS
1000.0000 mL | Freq: Once | INTRAVENOUS | Status: AC
  Administered 2018-04-10: 1000 mL via INTRAVENOUS

## 2018-04-10 MED ORDER — FENTANYL CITRATE (PF) 100 MCG/2ML IJ SOLN
50.0000 ug | Freq: Once | INTRAMUSCULAR | Status: AC
  Administered 2018-04-10: 50 ug via INTRAVENOUS
  Filled 2018-04-10: qty 2

## 2018-04-10 MED ORDER — ONDANSETRON HCL 4 MG/2ML IJ SOLN
4.0000 mg | Freq: Once | INTRAMUSCULAR | Status: AC
  Administered 2018-04-10: 4 mg via INTRAVENOUS
  Filled 2018-04-10: qty 2

## 2018-04-10 NOTE — ED Provider Notes (Signed)
[facility] EMERGENCY DEPARTMENT Provider Note   CSN: [id number] Arrival date & time: 04/10/18  1003     History   Chief Complaint Chief Complaint  Patient presents with  . Abdominal Pain    HPI Jaime [name] is a 32 y.o. female.  Right upper quadrant pain since 8 PM on Saturday evening with associated nausea, vomiting, diarrhea.  She is status post cholecystectomy.  Finally, she has diabetes, cirrhosis secondary to hepatitis C, chronic abdominal pain.  She has been off opiates for several years.  Fever, sweats, chills, jaundice seen.  Severity of symptoms is mild to moderate.  Nothing makes symptoms better or worse.     Past Medical History:  Diagnosis Date  . Anxiety   . Chronic abdominal pain   . Chronic back pain   . Cirrhosis ([place])   . Depression   . Diabetes mellitus without complication ([place])   . Hepatitis C   . Insomnia   . Long-term current use of methadone for opiate dependence ([place])   . Migraine headache   . Peptic ulcer     Patient Active Problem List   Diagnosis Date Noted  . CAP (community acquired pneumonia) 07/17/2016  . Acute hypoxemic respiratory failure ([place]) 07/17/2016  . Pleural effusion on right 07/17/2016  . Multiple rib fractures involving four or more ribs 07/17/2016  . Hemothorax on right 07/17/2016  . Chronic pain disorder 07/17/2016    Past Surgical History:  Procedure Laterality Date  . CHOLECYSTECTOMY       OB History    Gravida      Para      Term      Preterm      AB      Living  0     SAB      TAB      Ectopic      Multiple      Live Births               Home Medications    Prior to Admission medications   Medication Sig Start Date End Date Taking? Authorizing Provider  amoxicillin-clavulanate (AUGMENTIN) 875-125 MG tablet Take 1 tablet by mouth 2 (two) times daily. 07/20/16   Sinda Du, MD  clonazePAM (KLONOPIN) 1 MG tablet Take 1 mg by mouth 4 (four) times daily.     [provider]  etonogestrel (NEXPLANON) 68 MG IMPL implant 1 each by Subdermal route once.    [provider]  meclizine (ANTIVERT) 25 MG tablet Take 100 mg by mouth daily as needed for dizziness or nausea.    [provider]  metFORMIN (GLUCOPHAGE-XR) 500 MG 24 hr tablet Take 1,000 mg by mouth 2 (two) times daily.    [provider]  methadone (DOLOPHINE) 10 MG tablet Take 2 tablets (20 mg total) by mouth every 12 (twelve) hours. 07/20/16   Sinda Du, MD  omeprazole (PRILOSEC) 20 MG capsule Take 20 mg by mouth daily.    [provider]  oxyCODONE-acetaminophen (PERCOCET/ROXICET) 5-325 MG tablet Take 1 tablet by mouth every 4 (four) hours as needed for moderate pain. 07/20/16   Sinda Du, MD  Suvorexant (BELSOMRA) 15 MG TABS Take 15 mg by mouth at bedtime.    [provider]  vortioxetine HBr (TRINTELLIX) 10 MG TABS Take 10 mg by mouth every evening.    [provider]  zolpidem (AMBIEN) 10 MG tablet Take 1 tablet by mouth at bedtime as needed. 03/14/18  [provider]    Family History Family History  Problem Relation Age of Onset  . Hypertension Mother   . Hyperlipidemia Other     Social History Social History   Tobacco Use  . Smoking status: Former Smoker    Packs/day: 0.00    Years: 5.00    Pack years: 0.00  . Smokeless tobacco: Never Used  Substance Use Topics  . Alcohol use: No  . Drug use: No     Allergies   Ketorolac tromethamine; Ivp dye [iodinated diagnostic agents]; and Nsaids   Review of Systems Review of Systems  All other systems reviewed and are negative.    Physical Exam Updated Vital Signs BP 125/86   Pulse 85   Temp 98.2 F (36.8 C) (Oral)   Resp 18   Ht 5' 10"  (1.778 m)   Wt 118.8 kg (262 lb)   SpO2 96%   BMI 37.59 kg/m   Physical Exam  Constitutional: She is oriented to person, place, and time. She appears well-developed and well-nourished.  HENT:  Head:  Normocephalic and atraumatic.  Eyes: Conjunctivae are normal.  Neck: Neck supple.  Cardiovascular: Normal rate and regular rhythm.  Pulmonary/Chest: Effort normal and breath sounds normal.  Abdominal: Soft. Bowel sounds are normal.  Minimal right upper quadrant tenderness.  Musculoskeletal: Normal range of motion.  Neurological: She is alert and oriented to person, place, and time.  Skin: Skin is warm and dry.  Psychiatric: She has a normal mood and affect. Her behavior is normal.  Nursing note and vitals reviewed.    ED Treatments / Results  Labs (all labs ordered are listed, but only abnormal results are displayed) Labs Reviewed  COMPREHENSIVE METABOLIC PANEL - Abnormal; Notable for the following components:      Result Value   Glucose, Bld 157 (*)    AST 47 (*)    All other components within normal limits  CBC - Abnormal; Notable for the following components:   WBC 3.6 (*)    Platelets 73 (*)    All other components within normal limits  URINALYSIS, ROUTINE W REFLEX MICROSCOPIC - Abnormal; Notable for the following components:   APPearance HAZY (*)    All other components within normal limits  LIPASE, BLOOD  PREGNANCY, URINE    EKG None  Radiology No results found.  Procedures Procedures (including critical care time)  Medications Ordered in ED Medications  sodium chloride 0.9 % bolus 1,000 mL (0 mLs Intravenous Stopped 04/10/18 1319)  ondansetron (ZOFRAN) injection 4 mg (4 mg Intravenous Given 04/10/18 1202)  fentaNYL (SUBLIMAZE) injection 50 mcg (50 mcg Intravenous Given 04/10/18 1202)  fentaNYL (SUBLIMAZE) injection 50 mcg (50 mcg Intravenous Given 04/10/18 1514)     Initial Impression / Assessment and Plan / ED Course  I have reviewed the triage vital signs and the nursing notes.  Pertinent labs & imaging results that were available during my care of the patient were reviewed by me and considered in my medical decision making (see chart for details).      Presents with right upper quadrant pain.  No acute abdomen on physical exam.  White counts abnormal.  Liver functions and lipase normal.  She is status post cholecystectomy.  This could be a common bile duct stone.  She will follow-up with her primary care doctor.  Final Clinical Impressions(s) / ED Diagnoses   Final diagnoses:  RUQ abdominal pain  Thrombocytopenia [facility])    ED Discharge Orders    None  Nat Christen, MD 04/10/18 1556

## 2018-04-10 NOTE — ED Triage Notes (Signed)
Pt c/o right lower back pain and right sided abdominal pain, n/v/d, foul smelling urine, feeling as if she cannot fully empty her bladder that started last night. Denies fever.

## 2018-04-10 NOTE — Discharge Instructions (Addendum)
Test showed no life-threatening condition.  Follow-up with your primary care doctor.  Tylenol and/or ibuprofen for pain.

## 2018-04-20 ENCOUNTER — Emergency Department (HOSPITAL_COMMUNITY): Payer: Self-pay

## 2018-04-20 ENCOUNTER — Other Ambulatory Visit: Payer: Self-pay

## 2018-04-20 ENCOUNTER — Encounter (HOSPITAL_COMMUNITY): Payer: Self-pay | Admitting: Emergency Medicine

## 2018-04-20 ENCOUNTER — Emergency Department (HOSPITAL_COMMUNITY)
Admission: EM | Admit: 2018-04-20 | Discharge: 2018-04-21 | Disposition: A | Discharge status: Home / Self Care | Payer: Self-pay | Attending: Emergency Medicine | Admitting: Emergency Medicine

## 2018-04-20 DIAGNOSIS — Z79899 Other long term (current) drug therapy: Secondary | ICD-10-CM | POA: Insufficient documentation

## 2018-04-20 DIAGNOSIS — K7460 Unspecified cirrhosis of liver: Secondary | ICD-10-CM | POA: Insufficient documentation

## 2018-04-20 DIAGNOSIS — E119 Type 2 diabetes mellitus without complications: Secondary | ICD-10-CM | POA: Insufficient documentation

## 2018-04-20 DIAGNOSIS — R1013 Epigastric pain: Secondary | ICD-10-CM

## 2018-04-20 DIAGNOSIS — Z7984 Long term (current) use of oral hypoglycemic drugs: Secondary | ICD-10-CM | POA: Insufficient documentation

## 2018-04-20 DIAGNOSIS — Z87891 Personal history of nicotine dependence: Secondary | ICD-10-CM | POA: Insufficient documentation

## 2018-04-20 LAB — CBC
HCT: 40.6 % (ref 36.0–46.0)
Hemoglobin: 13.4 g/dL (ref 12.0–15.0)
MCH: 29.5 pg (ref 26.0–34.0)
MCHC: 33 g/dL (ref 30.0–36.0)
MCV: 89.2 fL (ref 78.0–100.0)
PLATELETS: 71 10*3/uL — AB (ref 150–400)
RBC: 4.55 MIL/uL (ref 3.87–5.11)
RDW: 13.6 % (ref 11.5–15.5)
WBC: 4.9 10*3/uL (ref 4.0–10.5)

## 2018-04-20 LAB — POC URINE PREG, ED: Preg Test, Ur: NEGATIVE

## 2018-04-20 LAB — BASIC METABOLIC PANEL
Anion gap: 7 (ref 5–15)
BUN: 7 mg/dL (ref 6–20)
CHLORIDE: 106 mmol/L (ref 101–111)
CO2: 23 mmol/L (ref 22–32)
CREATININE: 0.51 mg/dL (ref 0.44–1.00)
Calcium: 8.4 mg/dL — ABNORMAL LOW (ref 8.9–10.3)
GFR calc non Af Amer: >60 mL/min (ref >60)
Glucose, Bld: 150 mg/dL — ABNORMAL HIGH (ref 65–99)
Potassium: 3.5 mmol/L (ref 3.5–5.1)
Sodium: 136 mmol/L (ref 135–145)

## 2018-04-20 LAB — TROPONIN I: Troponin I: <0.03 ng/mL (ref <0.03)

## 2018-04-20 MED ORDER — FENTANYL CITRATE (PF) 100 MCG/2ML IJ SOLN
100.0000 ug | Freq: Once | INTRAMUSCULAR | Status: AC
  Administered 2018-04-20: 100 ug via INTRAVENOUS
  Filled 2018-04-20: qty 2

## 2018-04-20 MED ORDER — ONDANSETRON HCL 4 MG/2ML IJ SOLN
4.0000 mg | Freq: Once | INTRAMUSCULAR | Status: AC
  Administered 2018-04-20: 4 mg via INTRAVENOUS
  Filled 2018-04-20: qty 2

## 2018-04-20 MED ORDER — HYDROMORPHONE HCL 1 MG/ML IJ SOLN
1.0000 mg | Freq: Once | INTRAMUSCULAR | Status: AC
  Administered 2018-04-20: 1 mg via INTRAVENOUS
  Filled 2018-04-20: qty 1

## 2018-04-20 NOTE — ED Triage Notes (Signed)
Pt c/o of left sided chest pain radiating to left shoulder and mid back. Pt states she was sitting when cp started.

## 2018-04-20 NOTE — Discharge Instructions (Addendum)
Take your usual medications.  Follow-up with your doctor for ongoing management as soon as possible.

## 2018-04-20 NOTE — ED Notes (Signed)
Patient transported to X-ray 

## 2018-04-20 NOTE — ED Provider Notes (Signed)
[facility] EMERGENCY DEPARTMENT Provider Note   CSN: [id number] Arrival date & time: 04/20/18  [place]     History   Chief Complaint Chief Complaint  Patient presents with  . Chest Pain    HPI JACKALYNN ART is a 32 y.o. female.  HPI   Patient presents for evaluation of chest pain.  Patient has chronic pain, and is prescribed methadone, last prescription, 03/16/2018, #90 to take 3 times daily.  She complains of pain in her chest, as well as left and right upper abdomen, for 2 weeks.  She has trouble "swallowing," food or pills.  She has nausea and occasional vomiting but no diarrhea.  She checks her sugar at home and has been running about 150.  She was recently in the ED with abdominal pain, and instructed to follow-up with her PCP but has not been able to do that yet.  At that time there was concern for possible retained gallstone.  The patient denies fever, cough, shortness of breath, weakness or dizziness.  There are no other known modifying factors.  Past Medical History:  Diagnosis Date  . Anxiety   . Chronic abdominal pain   . Chronic back pain   . Cirrhosis ([place])   . Depression   . Diabetes mellitus without complication ([place])   . Hepatitis C   . Insomnia   . Long-term current use of methadone for opiate dependence ([place])   . Migraine headache   . Peptic ulcer     Patient Active Problem List   Diagnosis Date Noted  . CAP (community acquired pneumonia) 07/17/2016  . Acute hypoxemic respiratory failure ([place]) 07/17/2016  . Pleural effusion on right 07/17/2016  . Multiple rib fractures involving four or more ribs 07/17/2016  . Hemothorax on right 07/17/2016  . Chronic pain disorder 07/17/2016    Past Surgical History:  Procedure Laterality Date  . CHOLECYSTECTOMY       OB History    Gravida      Para      Term      Preterm      AB      Living  0     SAB      TAB      Ectopic      Multiple      Live Births               Home  Medications    Prior to Admission medications   Medication Sig Start Date End Date Taking? Authorizing Provider  albuterol (PROVENTIL) (2.5 MG/3ML) 0.083% nebulizer solution Take 2.5 mg by nebulization every 6 (six) hours as needed for wheezing or shortness of breath.   Yes [provider]  cetirizine (ZYRTEC) 10 MG tablet Take 10 mg by mouth every evening.   Yes [provider]  clonazePAM (KLONOPIN) 1 MG tablet Take 1 mg by mouth 4 (four) times daily.    Yes [provider]  etonogestrel (NEXPLANON) 68 MG IMPL implant 1 each by Subdermal route once.   Yes [provider]  metFORMIN (GLUCOPHAGE-XR) 500 MG 24 hr tablet Take 1,000 mg by mouth 2 (two) times daily.   Yes [provider]  methadone (DOLOPHINE) 10 MG tablet Take 2 tablets (20 mg total) by mouth every 12 (twelve) hours. Patient taking differently: Take 10 mg by mouth 3 (three) times daily.  07/20/16  Yes Sinda Du, MD  promethazine (PHENERGAN) 25 MG tablet Take 25 mg by mouth daily as needed for  nausea or vomiting.   Yes [provider]  ranitidine (ZANTAC) 150 MG tablet Take 150 mg by mouth at bedtime.   Yes [provider]  Suvorexant (BELSOMRA) 20 MG TABS Take 20 mg by mouth at bedtime.    Yes [provider]  vortioxetine HBr (TRINTELLIX) 20 MG TABS tablet Take 20 mg by mouth every evening.    Yes [provider]  zolpidem (AMBIEN) 10 MG tablet Take 1 tablet by mouth at bedtime.  03/14/18  Yes [provider]    Family History Family History  Problem Relation Age of Onset  . Hypertension Mother   . Hyperlipidemia Other     Social History Social History   Tobacco Use  . Smoking status: Former Smoker    Packs/day: 0.00    Years: 5.00    Pack years: 0.00  . Smokeless tobacco: Never Used  Substance Use Topics  . Alcohol use: No  . Drug use: No     Allergies   Ketorolac tromethamine; Ivp dye [iodinated diagnostic agents];  and Nsaids   Review of Systems Review of Systems  All other systems reviewed and are negative.    Physical Exam Updated Vital Signs BP 118/72   Pulse 95   Temp 97.7 F (36.5 C) (Oral)   Resp 19   Ht 5' 10"  (1.778 m)   Wt 117.9 kg (260 lb)   SpO2 96%   BMI 37.31 kg/m   Physical Exam  Constitutional: She is oriented to person, place, and time. She appears well-developed. She does not appear ill. No distress.  Morbidly obese  HENT:  Head: Normocephalic and atraumatic.  Eyes: Pupils are equal, round, and reactive to light. Conjunctivae and EOM are normal.  Neck: Normal range of motion and phonation normal. Neck supple.  Cardiovascular: Normal rate and regular rhythm.  Pulmonary/Chest: Effort normal and breath sounds normal. She exhibits no tenderness.  Abdominal: Soft. She exhibits no distension and no mass. There is tenderness (Left right upper abdominal, mild). There is no rebound and no guarding.  Musculoskeletal: Normal range of motion.  Neurological: She is alert and oriented to person, place, and time. She exhibits normal muscle tone.  Skin: Skin is warm and dry.  Psychiatric: She has a normal mood and affect. Her behavior is normal. Judgment and thought content normal.  Nursing note and vitals reviewed.    ED Treatments / Results  Labs (all labs ordered are listed, but only abnormal results are displayed) Labs Reviewed  BASIC METABOLIC PANEL - Abnormal; Notable for the following components:      Result Value   Glucose, Bld 150 (*)    Calcium 8.4 (*)    All other components within normal limits  CBC - Abnormal; Notable for the following components:   Platelets 71 (*)    All other components within normal limits  TROPONIN I  POC URINE PREG, ED    EKG EKG Interpretation  Date/Time:  Wednesday April 20 2018 18:58:04 EDT Ventricular Rate:  122 PR Interval:  158 QRS Duration: 86 QT Interval:  320 QTC Calculation: 456 R Axis:   35 Text Interpretation:   Sinus tachycardia Otherwise normal ECG since last tracing no significant change Confirmed by Daleen Bo [id number]) on 04/20/2018 8:31:02 PM   Radiology Ct Abdomen Pelvis Wo Contrast  Result Date: 04/20/2018 CLINICAL DATA:  32 year old female with left side pain radiating to the shoulder chest and back. Intermittent nausea vomiting and diarrhea for 2 weeks. EXAM: CT ABDOMEN AND  PELVIS WITHOUT CONTRAST TECHNIQUE: Multidetector CT imaging of the abdomen and pelvis was performed following the standard protocol without IV contrast. COMPARISON:  CT Abdomen and Pelvis 2117. FINDINGS: Lower chest: Stable lung bases, negative aside from mild scarring in the left lateral costophrenic angle. No pericardial or pleural effusion. Hepatobiliary: Surgically absent gallbladder. Stable hepatomegaly. No discrete liver lesion identified in the absence of contrast. Nodular liver contour most apparent on series 2, image 26. Pancreas: Fatty infiltrated. Spleen: Chronic splenomegaly. Estimated splenic volume 1,920 mL (normal splenic volume range 83 - 412 mL), not significantly changed since 2017 no discrete splenic lesion in the absence of contrast. Adrenals/Urinary Tract: Normal adrenal glands. Negative noncontrast appearance of both kidneys. Normal ureters. Diminutive and unremarkable urinary bladder. Stomach/Bowel: Negative rectum. Redundant but otherwise negative sigmoid colon. Intermittent retained gas and stool in the large bowel. No large bowel wall thickening. Chronic thickening of the bilateral lateral conal fascia. Negative appendix. Negative terminal ileum. Oral contrast has not yet reached the distal small bowel. No dilated small bowel. Decompressed stomach and duodenum. No abdominal free air, free fluid. Vascular/Lymphatic: Vascular patency is not evaluated in the absence of IV contrast. Chronic porta hepatis lymphadenopathy is stable since 2017 with individual nodes up to 20 millimeter short axis. No other  lymphadenopathy in the abdomen or pelvis. Reproductive: Negative noncontrast appearance. Other: No pelvic free fluid. Musculoskeletal: Age advanced lower thoracic spine degeneration. No acute osseous abnormality identified. IMPRESSION: 1. Chronic Hepatosplenomegaly with suspected Cirrhosis. No discrete liver lesion by noncontrast CT. Chronic porta hepatis lymphadenopathy is stable and related to chronic liver disease. Recommend Hepatology follow-up. 2. No acute process identified in the abdomen or pelvis. Electronically Signed   By: Genevie Ann M.D.   On: 04/20/2018 23:21   Dg Chest 2 View  Result Date: 04/20/2018 CLINICAL DATA:  Acute onset of chest pain today. EXAM: CHEST - 2 VIEW COMPARISON:  07/17/2016 FINDINGS: The heart size and mediastinal contours are within normal limits. Low lung volumes are noted, however both lungs are clear. The visualized skeletal structures are unremarkable. IMPRESSION: Low lung volumes.  No active cardiopulmonary disease. Electronically Signed   By: Earle Gell M.D.   On: 04/20/2018 19:42    Procedures Procedures (including critical care time)  Medications Ordered in ED Medications  HYDROmorphone (DILAUDID) injection 1 mg (has no administration in time range)  ondansetron (ZOFRAN) injection 4 mg (4 mg Intravenous Given 04/20/18 2110)  fentaNYL (SUBLIMAZE) injection 100 mcg (100 mcg Intravenous Given 04/20/18 2111)     Initial Impression / Assessment and Plan / ED Course  I have reviewed the triage vital signs and the nursing notes.  Pertinent labs & imaging results that were available during my care of the patient were reviewed by me and considered in my medical decision making (see chart for details).  Clinical Course as of Apr 21 2343  Wed Apr 20, 2018  2036 PMP databank report: NARX score 540, 552, 0   [EW]  2316 Normal  POC Urine Pregnancy, ED (do NOT order at [facility]) [EW]  2316 Normal except glucose high, calcium low  Basic metabolic panel(!) [EW]  8280  Normal  CBC(!) [EW]  2316 Normal  Troponin I [EW]  2317 No acute disease, images reviewed  DG Chest 2 View [EW]  2335 Chronic changes concerning for cirrhosis.  Enlarged liver and spleen with adenopathy, chronic.  CT Abdomen Pelvis Wo Contrast [EW]    Clinical Course User Index [EW] Daleen Bo, MD     Patient  Vitals for the past 24 hrs:  BP Temp Temp src Pulse Resp SpO2 Height Weight  04/20/18 2330 118/72 - - 95 19 96 % - -  04/20/18 2328 124/74 - - (!) 102 16 94 % - -  04/20/18 2230 116/78 - - 91 16 98 % - -  04/20/18 2200 124/81 - - 100 17 98 % - -  04/20/18 2130 107/78 - - 92 15 95 % - -  04/20/18 2100 110/78 - - 92 19 95 % - -  04/20/18 2030 123/76 - - 100 20 95 % - -  04/20/18 2000 105/77 - - (!) 112 17 94 % - -  04/20/18 1855 (!) 161/100 97.7 F (36.5 C) Oral (!) 125 20 98 % 5' 10"  (1.778 m) 117.9 kg (260 lb)    11:39 PM Reevaluation with update and discussion. After initial assessment and treatment, an updated evaluation reveals she is drinking fluids, tolerating well.  She now states that her pain is chronic and ongoing, and is being treated with methadone, by her PCP, pending referral to a specialty center for treatment of cirrhosis.  She states she was diagnosed with cirrhosis in January 2019 but cannot see a hepatologist, until her disability gets approved.  She is in the process of that.  She would like to have a dose of pain medicine prior to leaving, then will follow-up with her PCP.  Findings discussed and questions answered. Daleen Bo   Medical Decision Making: Chronic abdominal pain, with suspected cirrhosis as etiology causing hepatomegaly and splenomegaly.  Doubt serious bacterial infection, metabolic instability or impending vascular collapse.  CRITICAL CARE-no Performed by: Daleen Bo   Nursing Notes Reviewed/ Care Coordinated Applicable Imaging Reviewed Interpretation of Laboratory Data incorporated into ED treatment  The patient appears  reasonably screened and/or stabilized for discharge and I doubt any other medical condition or other [facility] requiring further screening, evaluation, or treatment in the ED at this time prior to discharge.  Plan: Home Medications-continue current medications; Home Treatments-usual diet; return here if the recommended treatment, does not improve the symptoms; Recommended follow up-PCP follow-up for ongoing management as soon as possible.     Final Clinical Impressions(s) / ED Diagnoses   Final diagnoses:  Epigastric pain  Cirrhosis of liver without ascites, unspecified hepatic cirrhosis type [facility])    ED Discharge Orders    None       Daleen Bo, MD 04/21/18 1019

## 2018-05-02 DIAGNOSIS — E1140 Type 2 diabetes mellitus with diabetic neuropathy, unspecified: Secondary | ICD-10-CM | POA: Insufficient documentation

## 2018-05-02 DIAGNOSIS — F1190 Opioid use, unspecified, uncomplicated: Secondary | ICD-10-CM | POA: Diagnosis present

## 2018-05-02 DIAGNOSIS — F39 Unspecified mood [affective] disorder: Secondary | ICD-10-CM | POA: Insufficient documentation

## 2018-05-15 ENCOUNTER — Emergency Department (HOSPITAL_COMMUNITY)
Admission: EM | Admit: 2018-05-15 | Discharge: 2018-05-15 | Disposition: A | Discharge status: Home / Self Care | Payer: Self-pay | Attending: Emergency Medicine | Admitting: Emergency Medicine

## 2018-05-15 ENCOUNTER — Other Ambulatory Visit: Payer: Self-pay

## 2018-05-15 ENCOUNTER — Encounter (HOSPITAL_COMMUNITY): Payer: Self-pay | Admitting: *Deleted

## 2018-05-15 DIAGNOSIS — R1011 Right upper quadrant pain: Secondary | ICD-10-CM | POA: Insufficient documentation

## 2018-05-15 DIAGNOSIS — Z87891 Personal history of nicotine dependence: Secondary | ICD-10-CM | POA: Insufficient documentation

## 2018-05-15 DIAGNOSIS — Z7984 Long term (current) use of oral hypoglycemic drugs: Secondary | ICD-10-CM | POA: Insufficient documentation

## 2018-05-15 DIAGNOSIS — E119 Type 2 diabetes mellitus without complications: Secondary | ICD-10-CM | POA: Insufficient documentation

## 2018-05-15 DIAGNOSIS — Z79899 Other long term (current) drug therapy: Secondary | ICD-10-CM | POA: Insufficient documentation

## 2018-05-15 LAB — URINALYSIS, ROUTINE W REFLEX MICROSCOPIC
BILIRUBIN URINE: NEGATIVE
Glucose, UA: >=500 mg/dL — AB
Hgb urine dipstick: NEGATIVE
Ketones, ur: NEGATIVE mg/dL
LEUKOCYTES UA: NEGATIVE
Nitrite: NEGATIVE
Protein, ur: NEGATIVE mg/dL
SPECIFIC GRAVITY, URINE: 1.023 (ref 1.005–1.030)
pH: 6 (ref 5.0–8.0)

## 2018-05-15 LAB — COMPREHENSIVE METABOLIC PANEL
ALBUMIN: 3.4 g/dL — AB (ref 3.5–5.0)
ALK PHOS: 85 U/L (ref 38–126)
ALT: 44 U/L (ref 0–44)
AST: 43 U/L — AB (ref 15–41)
Anion gap: 8 (ref 5–15)
BILIRUBIN TOTAL: 0.5 mg/dL (ref 0.3–1.2)
BUN: 7 mg/dL (ref 6–20)
CALCIUM: 8.8 mg/dL — AB (ref 8.9–10.3)
CO2: 25 mmol/L (ref 22–32)
CREATININE: 0.52 mg/dL (ref 0.44–1.00)
Chloride: 106 mmol/L (ref 98–111)
GFR calc Af Amer: >60 mL/min (ref >60)
GFR calc non Af Amer: >60 mL/min (ref >60)
GLUCOSE: 265 mg/dL — AB (ref 70–99)
Potassium: 3.7 mmol/L (ref 3.5–5.1)
SODIUM: 139 mmol/L (ref 135–145)
TOTAL PROTEIN: 7.3 g/dL (ref 6.5–8.1)

## 2018-05-15 LAB — CBC WITH DIFFERENTIAL/PLATELET
BASOS ABS: 0 10*3/uL (ref 0.0–0.1)
Basophils Relative: 0 %
Eosinophils Absolute: 0.1 10*3/uL (ref 0.0–0.7)
Eosinophils Relative: 3 %
HEMATOCRIT: 38.8 % (ref 36.0–46.0)
Hemoglobin: 12.9 g/dL (ref 12.0–15.0)
LYMPHS ABS: 1.3 10*3/uL (ref 0.7–4.0)
LYMPHS PCT: 35 %
MCH: 29.9 pg (ref 26.0–34.0)
MCHC: 33.2 g/dL (ref 30.0–36.0)
MCV: 90 fL (ref 78.0–100.0)
MONO ABS: 0.2 10*3/uL (ref 0.1–1.0)
MONOS PCT: 6 %
NEUTROS ABS: 2 10*3/uL (ref 1.7–7.7)
Neutrophils Relative %: 56 %
Platelets: 67 10*3/uL — ABNORMAL LOW (ref 150–400)
RBC: 4.31 MIL/uL (ref 3.87–5.11)
RDW: 13.3 % (ref 11.5–15.5)
WBC: 3.6 10*3/uL — ABNORMAL LOW (ref 4.0–10.5)

## 2018-05-15 LAB — LIPASE, BLOOD: LIPASE: 28 U/L (ref 11–51)

## 2018-05-15 LAB — PREGNANCY, URINE: PREG TEST UR: NEGATIVE

## 2018-05-15 MED ORDER — MORPHINE SULFATE (PF) 4 MG/ML IV SOLN
4.0000 mg | Freq: Once | INTRAVENOUS | Status: AC
Start: 2018-05-15 — End: 2018-05-15
  Administered 2018-05-15: 4 mg via INTRAVENOUS
  Filled 2018-05-15: qty 1

## 2018-05-15 MED ORDER — SODIUM CHLORIDE 0.9 % IV BOLUS
500.0000 mL | Freq: Once | INTRAVENOUS | Status: AC
  Administered 2018-05-15: 500 mL via INTRAVENOUS

## 2018-05-15 MED ORDER — ONDANSETRON HCL 4 MG/2ML IJ SOLN
4.0000 mg | Freq: Once | INTRAMUSCULAR | Status: AC
  Administered 2018-05-15: 4 mg via INTRAVENOUS
  Filled 2018-05-15: qty 2

## 2018-05-15 MED ORDER — MORPHINE SULFATE (PF) 4 MG/ML IV SOLN
4.0000 mg | Freq: Once | INTRAVENOUS | Status: AC
  Administered 2018-05-15: 4 mg via INTRAVENOUS
  Filled 2018-05-15: qty 1

## 2018-05-15 NOTE — Discharge Instructions (Addendum)
Follow-up with your primary care doctor this week for further evaluation

## 2018-05-15 NOTE — ED Provider Notes (Signed)
[facility] EMERGENCY DEPARTMENT Provider Note   CSN: [id number] Arrival date & time: 05/15/18  0548     History   Chief Complaint Chief Complaint  Patient presents with  . Abdominal Pain    HPI Jaime [name] is a 32 y.o. female.  Right upper quadrant pain since June 1.  Patient was recently admitted to [facility] for liver and spleen enlargement, cirrhosis, hepatitis C virus.  Pain worsened in the middle the night.  She has nausea but no vomiting.  Past medical history includes depression, anxiety, diabetes.  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  No fever, sweats, chills, jaundice, dysuria.  She is under a pain contract with her primary care doctor.     Past Medical History:  Diagnosis Date  . Anxiety   . Chronic abdominal pain   . Chronic back pain   . Cirrhosis ([place])   . Depression   . Diabetes mellitus without complication ([place])   . Hepatitis C   . Insomnia   . Long-term current use of methadone for opiate dependence ([place])   . Migraine headache   . Peptic ulcer     Patient Active Problem List   Diagnosis Date Noted  . CAP (community acquired pneumonia) 07/17/2016  . Acute hypoxemic respiratory failure ([place]) 07/17/2016  . Pleural effusion on right 07/17/2016  . Multiple rib fractures involving four or more ribs 07/17/2016  . Hemothorax on right 07/17/2016  . Chronic pain disorder 07/17/2016    Past Surgical History:  Procedure Laterality Date  . CHOLECYSTECTOMY       OB History    Gravida      Para      Term      Preterm      AB      Living  0     SAB      TAB      Ectopic      Multiple      Live Births               Home Medications    Prior to Admission medications   Medication Sig Start Date End Date Taking? Authorizing Provider  albuterol (PROVENTIL) (2.5 MG/3ML) 0.083% nebulizer solution Take 2.5 mg by nebulization every 6 (six) hours as needed for wheezing or shortness  of breath.   Yes [provider]  butorphanol (STADOL) 10 MG/ML nasal spray Place 1 spray into the nose every 4 (four) hours as needed (breakthrough pain).   Yes [provider]  cetirizine (ZYRTEC) 10 MG tablet Take 10 mg by mouth every evening.   Yes [provider]  clonazePAM (KLONOPIN) 1 MG tablet Take 1 mg by mouth 4 (four) times daily.    Yes [provider]  etonogestrel (NEXPLANON) 68 MG IMPL implant 1 each by Subdermal route once.   Yes [provider]  metFORMIN (GLUCOPHAGE-XR) 500 MG 24 hr tablet Take 1,000 mg by mouth 2 (two) times daily.   Yes [provider]  methadone (DOLOPHINE) 10 MG tablet Take 2 tablets (20 mg total) by mouth every 12 (twelve) hours. Patient taking differently: Take 10 mg by mouth 3 (three) times daily.  07/20/16  Yes Sinda Du, MD  pantoprazole (PROTONIX) 40 MG tablet Take 40 mg by mouth daily.   Yes [provider]  promethazine (PHENERGAN) 25 MG tablet Take 25 mg by mouth daily as needed for nausea or vomiting.  Yes [provider]  ranitidine (ZANTAC) 150 MG tablet Take 150 mg by mouth at bedtime.   Yes [provider]  sucralfate (CARAFATE) 1 g tablet Take 1 g by mouth 4 (four) times daily as needed (ulcers).    Yes [provider]  Suvorexant (BELSOMRA) 20 MG TABS Take 20 mg by mouth at bedtime.    Yes [provider]  tetrahydrozoline 0.05 % ophthalmic solution Place 2 drops into both eyes 2 (two) times daily as needed (allergies/dry/itchy).   Yes [provider]  vortioxetine HBr (TRINTELLIX) 20 MG TABS tablet Take 20 mg by mouth every evening.    Yes [provider]  zolpidem (AMBIEN) 10 MG tablet Take 1 tablet by mouth at bedtime.  03/14/18  Yes [provider]    Family History Family History  Problem Relation Age of Onset  . Hypertension Mother   . Hyperlipidemia Other     Social History Social History   Tobacco  Use  . Smoking status: Former Smoker    Packs/day: 0.00    Years: 5.00    Pack years: 0.00  . Smokeless tobacco: Never Used  Substance Use Topics  . Alcohol use: No  . Drug use: No     Allergies   Iodine-131; Ketorolac; Ketorolac tromethamine; Ivp dye [iodinated diagnostic agents]; Nsaids; Toradol [ketorolac tromethamine]; and Vancomycin   Review of Systems Review of Systems  All other systems reviewed and are negative.    Physical Exam Updated Vital Signs BP 132/80 (BP Location: Left Arm)   Pulse 89   Temp 98.5 F (36.9 C)   Resp 18   Ht 5' 10"  (1.778 m)   Wt 116.1 kg (256 lb)   SpO2 97%   BMI 36.73 kg/m   Physical Exam  Constitutional: She is oriented to person, place, and time. She appears well-developed and well-nourished.  HENT:  Head: Normocephalic and atraumatic.  Eyes: Conjunctivae are normal.  Neck: Neck supple.  Cardiovascular: Normal rate and regular rhythm.  Pulmonary/Chest: Effort normal and breath sounds normal.  Abdominal: Soft. Bowel sounds are normal.  Right upper quadrant tenderness.  Musculoskeletal: Normal range of motion.  Neurological: She is alert and oriented to person, place, and time.  Skin: Skin is warm and dry.  Psychiatric: She has a normal mood and affect. Her behavior is normal.  Nursing note and vitals reviewed.    ED Treatments / Results  Labs (all labs ordered are listed, but only abnormal results are displayed) Labs Reviewed  COMPREHENSIVE METABOLIC PANEL - Abnormal; Notable for the following components:      Result Value   Glucose, Bld 265 (*)    Calcium 8.8 (*)    Albumin 3.4 (*)    AST 43 (*)    All other components within normal limits  CBC WITH DIFFERENTIAL/PLATELET - Abnormal; Notable for the following components:   WBC 3.6 (*)    Platelets 67 (*)    All other components within normal limits  URINALYSIS, ROUTINE W REFLEX MICROSCOPIC - Abnormal; Notable for the following components:   APPearance HAZY (*)     Glucose, UA >=500 (*)    Bacteria, UA RARE (*)    All other components within normal limits  PREGNANCY, URINE  LIPASE, BLOOD    EKG None  Radiology No results found.  Procedures Procedures (including critical care time)  Medications Ordered in ED Medications  sodium chloride 0.9 % bolus 500 mL (0 mLs Intravenous Stopped 05/15/18 0903)  morphine 4  MG/ML injection 4 mg (4 mg Intravenous Given 05/15/18 0802)  ondansetron (ZOFRAN) injection 4 mg (4 mg Intravenous Given 05/15/18 0800)  morphine 4 MG/ML injection 4 mg (4 mg Intravenous Given 05/15/18 0910)  morphine 4 MG/ML injection 4 mg (4 mg Intravenous Given 05/15/18 1054)     Initial Impression / Assessment and Plan / ED Course  I have reviewed the triage vital signs and the nursing notes.  Pertinent labs & imaging results that were available during my care of the patient were reviewed by me and considered in my medical decision making (see chart for details).     Patient with known history of hepatosplenomegaly, cirrhosis, hepatitis C virus presents with persistent right upper quadrant pain.  White count normal.  Glucose elevated.  She feels better after IV fluids and pain management.  She has primary care follow-up locally.  Final Clinical Impressions(s) / ED Diagnoses   Final diagnoses:  Right upper quadrant abdominal pain    ED Discharge Orders    None       Nat Christen, MD 05/15/18 1546

## 2018-05-15 NOTE — ED Triage Notes (Signed)
Pt c/o right side abd pain with nausea that started this am ,

## 2018-05-22 DIAGNOSIS — F17200 Nicotine dependence, unspecified, uncomplicated: Secondary | ICD-10-CM | POA: Insufficient documentation

## 2018-06-14 ENCOUNTER — Emergency Department (HOSPITAL_COMMUNITY)
Admission: EM | Admit: 2018-06-14 | Discharge: 2018-06-14 | Disposition: A | Discharge status: Home / Self Care | Payer: Medicaid Other | Attending: Emergency Medicine | Admitting: Emergency Medicine

## 2018-06-14 ENCOUNTER — Emergency Department (HOSPITAL_COMMUNITY): Payer: Medicaid Other

## 2018-06-14 ENCOUNTER — Encounter (HOSPITAL_COMMUNITY): Payer: Self-pay | Admitting: Emergency Medicine

## 2018-06-14 ENCOUNTER — Other Ambulatory Visit: Payer: Self-pay

## 2018-06-14 DIAGNOSIS — Z79899 Other long term (current) drug therapy: Secondary | ICD-10-CM | POA: Diagnosis not present

## 2018-06-14 DIAGNOSIS — Z87891 Personal history of nicotine dependence: Secondary | ICD-10-CM | POA: Diagnosis not present

## 2018-06-14 DIAGNOSIS — R1013 Epigastric pain: Secondary | ICD-10-CM | POA: Diagnosis present

## 2018-06-14 DIAGNOSIS — Z7984 Long term (current) use of oral hypoglycemic drugs: Secondary | ICD-10-CM | POA: Diagnosis not present

## 2018-06-14 DIAGNOSIS — E119 Type 2 diabetes mellitus without complications: Secondary | ICD-10-CM | POA: Insufficient documentation

## 2018-06-14 LAB — URINALYSIS, ROUTINE W REFLEX MICROSCOPIC
BACTERIA UA: NONE SEEN
BILIRUBIN URINE: NEGATIVE
GLUCOSE, UA: NEGATIVE mg/dL
HGB URINE DIPSTICK: NEGATIVE
Ketones, ur: NEGATIVE mg/dL
LEUKOCYTES UA: NEGATIVE
NITRITE: POSITIVE — AB
PROTEIN: 30 mg/dL — AB
Specific Gravity, Urine: 1.019 (ref 1.005–1.030)
pH: 6 (ref 5.0–8.0)

## 2018-06-14 LAB — COMPREHENSIVE METABOLIC PANEL
ALBUMIN: 4 g/dL (ref 3.5–5.0)
ALT: 51 U/L — ABNORMAL HIGH (ref 0–44)
ANION GAP: 8 (ref 5–15)
AST: 56 U/L — ABNORMAL HIGH (ref 15–41)
Alkaline Phosphatase: 103 U/L (ref 38–126)
BUN: 5 mg/dL — ABNORMAL LOW (ref 6–20)
CO2: 26 mmol/L (ref 22–32)
Calcium: 9 mg/dL (ref 8.9–10.3)
Chloride: 105 mmol/L (ref 98–111)
Creatinine, Ser: 0.47 mg/dL (ref 0.44–1.00)
GLUCOSE: 157 mg/dL — AB (ref 70–99)
POTASSIUM: 3.4 mmol/L — AB (ref 3.5–5.1)
Sodium: 139 mmol/L (ref 135–145)
Total Bilirubin: 1.1 mg/dL (ref 0.3–1.2)
Total Protein: 8.1 g/dL (ref 6.5–8.1)

## 2018-06-14 LAB — CBC
HEMATOCRIT: 43.7 % (ref 36.0–46.0)
HEMOGLOBIN: 14.3 g/dL (ref 12.0–15.0)
MCH: 29.7 pg (ref 26.0–34.0)
MCHC: 32.7 g/dL (ref 30.0–36.0)
MCV: 90.7 fL (ref 78.0–100.0)
Platelets: 75 10*3/uL — ABNORMAL LOW (ref 150–400)
RBC: 4.82 MIL/uL (ref 3.87–5.11)
RDW: 13.2 % (ref 11.5–15.5)
WBC: 4.5 10*3/uL (ref 4.0–10.5)

## 2018-06-14 LAB — I-STAT BETA HCG BLOOD, ED (MC, WL, AP ONLY): I-stat hCG, quantitative: <5 m[IU]/mL (ref <5)

## 2018-06-14 LAB — LIPASE, BLOOD: Lipase: 24 U/L (ref 11–51)

## 2018-06-14 MED ORDER — SODIUM CHLORIDE 0.9 % IV BOLUS
1000.0000 mL | Freq: Once | INTRAVENOUS | Status: AC
  Administered 2018-06-14: 1000 mL via INTRAVENOUS

## 2018-06-14 MED ORDER — ONDANSETRON HCL 4 MG/2ML IJ SOLN
4.0000 mg | Freq: Once | INTRAMUSCULAR | Status: AC
  Administered 2018-06-14: 4 mg via INTRAVENOUS
  Filled 2018-06-14: qty 2

## 2018-06-14 MED ORDER — MORPHINE SULFATE (PF) 4 MG/ML IV SOLN
4.0000 mg | Freq: Once | INTRAVENOUS | Status: AC
  Administered 2018-06-14: 4 mg via INTRAVENOUS
  Filled 2018-06-14: qty 1

## 2018-06-14 NOTE — ED Provider Notes (Signed)
[facility] EMERGENCY DEPARTMENT Provider Note   CSN: [id number] Arrival date & time: 06/14/18  0450     History   Chief Complaint Chief Complaint  Patient presents with  . Abdominal Pain    HPI Jaime [name] is a 32 y.o. female.  Patient is a 32 year old female with past medical history of morbid obesity, cirrhosis, hepatitis C, IV drug abuse, and chronic pain for which she is on methadone therapy.  She presents today for evaluation of epigastric pain, nausea, vomiting that woke her from sleep.  She reports vomiting emesis that was tinged with blood and "dark brown material".  She denies any fevers or chills.  She denies any diarrhea, melena, or constipation.  He was recently admitted at [facility] with a similar presentation where she underwent an endoscopy which was normal.  There was no evidence for varices or other source for her reported hematemesis.  The history is provided by the patient.  Abdominal Pain   This is a recurrent problem. The current episode started 1 to 2 hours ago. The problem occurs constantly. The problem has been rapidly worsening. The pain is associated with an unknown factor. The pain is located in the epigastric region. The quality of the pain is cramping. The pain is moderate. Pertinent negatives include fever and hematochezia. Nothing aggravates the symptoms. Nothing relieves the symptoms.    Past Medical History:  Diagnosis Date  . Anxiety   . Chronic abdominal pain   . Chronic back pain   . Cirrhosis ([place])   . Depression   . Diabetes mellitus without complication ([place])   . Hepatitis C   . Insomnia   . Long-term current use of methadone for opiate dependence ([place])   . Migraine headache   . Peptic ulcer     Patient Active Problem List   Diagnosis Date Noted  . CAP (community acquired pneumonia) 07/17/2016  . Acute hypoxemic respiratory failure ([place]) 07/17/2016  . Pleural effusion on right 07/17/2016  . Multiple rib fractures involving  four or more ribs 07/17/2016  . Hemothorax on right 07/17/2016  . Chronic pain disorder 07/17/2016    Past Surgical History:  Procedure Laterality Date  . CHOLECYSTECTOMY       OB History    Gravida      Para      Term      Preterm      AB      Living  0     SAB      TAB      Ectopic      Multiple      Live Births               Home Medications    Prior to Admission medications   Medication Sig Start Date End Date Taking? Authorizing Provider  albuterol (PROVENTIL) (2.5 MG/3ML) 0.083% nebulizer solution Take 2.5 mg by nebulization every 6 (six) hours as needed for wheezing or shortness of breath.   Yes [provider]  butorphanol (STADOL) 10 MG/ML nasal spray Place 1 spray into the nose every 4 (four) hours as needed (breakthrough pain).   Yes [provider]  cetirizine (ZYRTEC) 10 MG tablet Take 10 mg by mouth every evening.   Yes [provider]  clonazePAM (KLONOPIN) 1 MG tablet Take 1 mg by mouth 4 (four) times daily.    Yes [provider]  etonogestrel (NEXPLANON) 68 MG IMPL implant 1 each by Subdermal route once.  Yes [provider]  metFORMIN (GLUCOPHAGE-XR) 500 MG 24 hr tablet Take 1,000 mg by mouth 2 (two) times daily.   Yes [provider]  methadone (DOLOPHINE) 10 MG tablet Take 2 tablets (20 mg total) by mouth every 12 (twelve) hours. Patient taking differently: Take 10 mg by mouth 5 (five) times daily.  07/20/16  Yes Sinda Du, MD  pantoprazole (PROTONIX) 40 MG tablet Take 40 mg by mouth daily.   Yes [provider]  promethazine (PHENERGAN) 25 MG tablet Take 25 mg by mouth daily as needed for nausea or vomiting.   Yes [provider]  ranitidine (ZANTAC) 150 MG tablet Take 150 mg by mouth at bedtime.   Yes [provider]  sucralfate (CARAFATE) 1 g tablet Take 1 g by mouth 4 (four) times daily as needed (ulcers).    Yes [provider]  Suvorexant  (BELSOMRA) 20 MG TABS Take 20 mg by mouth at bedtime.    Yes [provider]  tetrahydrozoline 0.05 % ophthalmic solution Place 2 drops into both eyes 2 (two) times daily as needed (allergies/dry/itchy).   Yes [provider]  vortioxetine HBr (TRINTELLIX) 20 MG TABS tablet Take 20 mg by mouth every evening.    Yes [provider]  zolpidem (AMBIEN) 10 MG tablet Take 1 tablet by mouth at bedtime.  03/14/18  Yes [provider]    Family History Family History  Problem Relation Age of Onset  . Hypertension Mother   . Hyperlipidemia Other     Social History Social History   Tobacco Use  . Smoking status: Former Smoker    Packs/day: 0.00    Years: 5.00    Pack years: 0.00  . Smokeless tobacco: Never Used  Substance Use Topics  . Alcohol use: No  . Drug use: No     Allergies   Iodine-131; Ketorolac; Ketorolac tromethamine; Ivp dye [iodinated diagnostic agents]; Nsaids; Toradol [ketorolac tromethamine]; and Vancomycin   Review of Systems Review of Systems  Constitutional: Negative for fever.  Gastrointestinal: Positive for abdominal pain. Negative for hematochezia.  All other systems reviewed and are negative.    Physical Exam Updated Vital Signs BP (!) 158/101 (BP Location: Right Arm)   Pulse (!) 133   Temp 99.5 F (37.5 C) (Oral)   Resp 18   Ht 5' 10"  (1.778 m)   Wt 122.5 kg   SpO2 96%   BMI 38.74 kg/m   Physical Exam  Constitutional: She is oriented to person, place, and time. She appears well-developed and well-nourished. No distress.  HENT:  Head: Normocephalic and atraumatic.  Neck: Normal range of motion. Neck supple.  Cardiovascular: Normal rate and regular rhythm. Exam reveals no gallop and no friction rub.  No murmur heard. Pulmonary/Chest: Effort normal and breath sounds normal. No respiratory distress. She has no wheezes.  Abdominal: Soft. Bowel sounds are normal. She exhibits no distension. There is tenderness in  the epigastric area. There is no rigidity, no rebound and no guarding.  Musculoskeletal: Normal range of motion.  Neurological: She is alert and oriented to person, place, and time.  Skin: Skin is warm and dry. She is not diaphoretic.  Nursing note and vitals reviewed.    ED Treatments / Results  Labs (all labs ordered are listed, but only abnormal results are displayed) Labs Reviewed  LIPASE, BLOOD  COMPREHENSIVE METABOLIC PANEL  CBC  URINALYSIS, ROUTINE W REFLEX MICROSCOPIC  I-STAT BETA HCG BLOOD, ED ([facility], [facility], [facility] ONLY)  EKG None  Radiology No results found.  Procedures Procedures (including critical care time)  Medications Ordered in ED Medications  ondansetron (ZOFRAN) injection 4 mg (has no administration in time range)  sodium chloride 0.9 % bolus 1,000 mL (has no administration in time range)  morphine 4 MG/ML injection 4 mg (has no administration in time range)     Initial Impression / Assessment and Plan / ED Course  I have reviewed the triage vital signs and the nursing notes.  Pertinent labs & imaging results that were available during my care of the patient were reviewed by me and considered in my medical decision making (see chart for details).  Patient with history of chronic abdominal pain recently hospitalized at [facility] and undergoing multiple tests including endoscopy.  She presents today with complaints of pain in her stomach and reporting having thrown up blood.  Her work-up reveals no elevation of white count, hemoglobin of 14.3, and electrolytes that are all essentially unremarkable.  A CT scan was obtained and shows no acute findings.  There is nonspecific stranding throughout the retroperitoneum, however this does not fit her clinical symptoms.  I am uncertain as to the exact etiology of this but I do not feel that this is causing her problems.  Upon reviewing her recent admission from [facility], she underwent endoscopy for her reported hematemesis,  however her endoscopy was normal.  I find nothing emergent today.  She was given IV fluids along with medicine for her pain and nausea.  At this point, I feel as though discharge is appropriate.  If she worsens, I have advised her to touch base with her GI doctor.  Final Clinical Impressions(s) / ED Diagnoses   Final diagnoses:  None    ED Discharge Orders    None       Veryl Speak, MD 06/14/18 [phone]

## 2018-06-14 NOTE — Discharge Instructions (Addendum)
Continue your medications as previously prescribed, and follow-up with your primary doctor and gastroenterologist in the next few days if symptoms are not improving.

## 2018-06-14 NOTE — ED Triage Notes (Signed)
Pt states she started vomiting approximately 1 hr ago. States she has vomited 3 times and that her emesis had bright red/brown blood. Pt also c/o abdominal pain around umbilicus.

## 2018-07-05 DIAGNOSIS — D72819 Decreased white blood cell count, unspecified: Secondary | ICD-10-CM | POA: Insufficient documentation

## 2018-07-12 ENCOUNTER — Emergency Department (HOSPITAL_COMMUNITY): Payer: Medicaid Other

## 2018-07-12 ENCOUNTER — Emergency Department (HOSPITAL_COMMUNITY)
Admission: EM | Admit: 2018-07-12 | Discharge: 2018-07-12 | Disposition: A | Discharge status: Home / Self Care | Payer: Medicaid Other | Attending: Emergency Medicine | Admitting: Emergency Medicine

## 2018-07-12 ENCOUNTER — Encounter (HOSPITAL_COMMUNITY): Payer: Self-pay | Admitting: Emergency Medicine

## 2018-07-12 ENCOUNTER — Other Ambulatory Visit: Payer: Self-pay

## 2018-07-12 DIAGNOSIS — Z87891 Personal history of nicotine dependence: Secondary | ICD-10-CM | POA: Insufficient documentation

## 2018-07-12 DIAGNOSIS — S39012A Strain of muscle, fascia and tendon of lower back, initial encounter: Secondary | ICD-10-CM | POA: Diagnosis not present

## 2018-07-12 DIAGNOSIS — E119 Type 2 diabetes mellitus without complications: Secondary | ICD-10-CM | POA: Insufficient documentation

## 2018-07-12 DIAGNOSIS — Y93E1 Activity, personal bathing and showering: Secondary | ICD-10-CM | POA: Diagnosis not present

## 2018-07-12 DIAGNOSIS — Y999 Unspecified external cause status: Secondary | ICD-10-CM | POA: Diagnosis not present

## 2018-07-12 DIAGNOSIS — Y929 Unspecified place or not applicable: Secondary | ICD-10-CM | POA: Insufficient documentation

## 2018-07-12 DIAGNOSIS — R300 Dysuria: Secondary | ICD-10-CM | POA: Diagnosis not present

## 2018-07-12 DIAGNOSIS — W19XXXA Unspecified fall, initial encounter: Secondary | ICD-10-CM

## 2018-07-12 DIAGNOSIS — W182XXA Fall in (into) shower or empty bathtub, initial encounter: Secondary | ICD-10-CM | POA: Insufficient documentation

## 2018-07-12 DIAGNOSIS — R1084 Generalized abdominal pain: Secondary | ICD-10-CM | POA: Diagnosis present

## 2018-07-12 LAB — URINALYSIS, ROUTINE W REFLEX MICROSCOPIC
BACTERIA UA: NONE SEEN
BILIRUBIN URINE: NEGATIVE
GLUCOSE, UA: NEGATIVE mg/dL
KETONES UR: NEGATIVE mg/dL
LEUKOCYTES UA: NEGATIVE
Nitrite: POSITIVE — AB
Protein, ur: 100 mg/dL — AB
RBC / HPF: >50 RBC/hpf — ABNORMAL HIGH (ref 0–5)
Specific Gravity, Urine: 1.025 (ref 1.005–1.030)
pH: 6 (ref 5.0–8.0)

## 2018-07-12 LAB — CBG MONITORING, ED: Glucose-Capillary: 84 mg/dL (ref 70–99)

## 2018-07-12 LAB — PREGNANCY, URINE: Preg Test, Ur: NEGATIVE

## 2018-07-12 MED ORDER — ONDANSETRON 4 MG PO TBDP
4.0000 mg | ORAL_TABLET | Freq: Once | ORAL | Status: AC
  Administered 2018-07-12: 4 mg via ORAL
  Filled 2018-07-12: qty 1

## 2018-07-12 NOTE — ED Triage Notes (Signed)
PT states she slipped getting out of the shower and fell in the tub. PT c/o pain to lower back, lower abdomen, and right side. PT states pain with urination over the past few days.

## 2018-07-12 NOTE — ED Provider Notes (Signed)
Emergency Department Provider Note   I have reviewed the triage vital signs and the nursing notes.   HISTORY  Chief Complaint Fall   HPI Jaime [name] is a 32 y.o. female with PMH of chronic abdominal pain, Hep C, and cirrhosis presents to the emergency department for evaluation of pain after falling in the shower today. This morning in the shower she felt slightly lightheaded and then fell over the edge of the tub.  She struck her lower abdomen on the side of the tub.  She denies head injury or loss of consciousness.  She is having pain in her pelvis area and lower back.  No pain in the arms or legs.  No chest wall pain.  No weakness or numbness in the extremities. No radiation of symptoms or modifying factors.   Patient also states that she has had UTI symptoms of frequency and mild dysuria over the past 2 to 3 days.  She took an Azo with no relief in symptoms.   Past Medical History:  Diagnosis Date  . Anxiety   . Chronic abdominal pain   . Chronic back pain   . Cirrhosis ([place])   . Depression   . Diabetes mellitus without complication ([name])   . Hepatitis C   . Insomnia   . Gagandeep Pettet-term current use of methadone for opiate dependence ([place])   . Migraine headache   . Peptic ulcer     Patient Active Problem List   Diagnosis Date Noted  . CAP (community acquired pneumonia) 07/17/2016  . Acute hypoxemic respiratory failure ([place]) 07/17/2016  . Pleural effusion on right 07/17/2016  . Multiple rib fractures involving four or more ribs 07/17/2016  . Hemothorax on right 07/17/2016  . Chronic pain disorder 07/17/2016    Past Surgical History:  Procedure Laterality Date  . CHOLECYSTECTOMY      Allergies Iodine-131; Ketorolac; Ketorolac tromethamine; Ivp dye [iodinated diagnostic agents]; Nsaids; Toradol [ketorolac tromethamine]; and Vancomycin  Family History  Problem Relation Age of Onset  . Hypertension Mother   . Hyperlipidemia Other     Social History Social  History   Tobacco Use  . Smoking status: Former Smoker    Packs/day: 0.00    Years: 5.00    Pack years: 0.00  . Smokeless tobacco: Never Used  Substance Use Topics  . Alcohol use: No  . Drug use: No    Review of Systems  Constitutional: No fever/chills Eyes: No visual changes. ENT: No sore throat. Cardiovascular: Denies chest pain. Respiratory: Denies shortness of breath. Gastrointestinal: No abdominal pain.  No nausea, no vomiting.  No diarrhea.  No constipation. Genitourinary: Positive for dysuria. Musculoskeletal: Positive for back pain. Skin: Negative for rash. Neurological: Negative for headaches, focal weakness or numbness.  10-point ROS otherwise negative.  ____________________________________________   PHYSICAL EXAM:  VITAL SIGNS: ED Triage Vitals  Enc Vitals Group     BP 07/12/18 0749 (!) 162/92     Pulse Rate 07/12/18 0749 (!) 106     Resp 07/12/18 0749 18     Temp 07/12/18 0749 98.1 F (36.7 C)     Temp Source 07/12/18 0749 Oral     SpO2 07/12/18 0749 99 %     Weight 07/12/18 0749 270 lb (122.5 kg)     Height 07/12/18 0749 5' 10"  (1.778 m)     Pain Score 07/12/18 0748 9   Constitutional: Alert and oriented. Well appearing and in no acute distress. Eyes: Conjunctivae are normal.  Head: Atraumatic.  Nose: No congestion/rhinnorhea. Mouth/Throat: Mucous membranes are moist.  Neck: No stridor.  Cardiovascular: Normal rate, regular rhythm. Good peripheral circulation. Grossly normal heart sounds.   Respiratory: Normal respiratory effort.  No retractions. Lungs CTAB. Gastrointestinal: Soft and nontender. No distention. No lower abdominal bruising.  Musculoskeletal: No lower extremity tenderness nor edema. No gross deformities of extremities. Mild paraspinal lumbar tenderness. No thoracic tenderness to palpation.  Neurologic:  Normal speech and language. No gross focal neurologic deficits are appreciated.  Skin:  Skin is warm, dry and intact. No rash  noted.  ____________________________________________   LABS (all labs ordered are listed, but only abnormal results are displayed)  Labs Reviewed  URINALYSIS, ROUTINE W REFLEX MICROSCOPIC - Abnormal; Notable for the following components:      Result Value   Color, Urine AMBER (*)    APPearance CLOUDY (*)    Hgb urine dipstick LARGE (*)    Protein, ur 100 (*)    Nitrite POSITIVE (*)    RBC / HPF >50 (*)    WBC, UA >50 (*)    All other components within normal limits  URINE CULTURE  PREGNANCY, URINE  CBG MONITORING, ED   ____________________________________________  RADIOLOGY  Dg Lumbar Spine 2-3 Views  Result Date: 07/12/2018 CLINICAL DATA:  Lower back pain after fall. EXAM: LUMBAR SPINE - 2-3 VIEW COMPARISON:  CT scan of June 19, 2018. FINDINGS: No fracture or spondylolisthesis is noted. Mild degenerative disc disease is noted at L2-3 with anterior osteophyte formation. Remaining disc spaces are unremarkable. IMPRESSION: Mild degenerative disc disease is noted at L2-3. No acute abnormality seen in the lumbar spine. Electronically Signed   By: Marijo Conception, M.D.   On: 07/12/2018 09:38   Dg Pelvis 1-2 Views  Result Date: 07/12/2018 CLINICAL DATA:  Lower back pain after fall. EXAM: PELVIS - 1-2 VIEW COMPARISON:  None. FINDINGS: There is no evidence of pelvic fracture or diastasis. No pelvic bone lesions are seen. IMPRESSION: Negative. Electronically Signed   By: Marijo Conception, M.D.   On: 07/12/2018 09:37    ____________________________________________   PROCEDURES  Procedure(s) performed:   Procedures  None ____________________________________________   INITIAL IMPRESSION / ASSESSMENT AND PLAN / ED COURSE  Pertinent labs & imaging results that were available during my care of the patient were reviewed by me and considered in my medical decision making (see chart for details).  Resents to the emergency department for evaluation after falling in the shower  today.  Blood sugar is slightly low but doubt it is causing symptoms.  Encouraging the patient to eat to keep this slightly higher.  No neurological deficits.  Patient had x-rays of the pelvis and lower back which are for acute injury.  UA is equivocal.  Some of the results may be secondary to Azo which the patient took yesterday.  I am electing to send the urine for culture and would consider treatment if this grows bacterial source.  Provided Zofran in the emergency department with some nausea.  Patient is on methadone.  She has indication to taking Tylenol and Motrin because of liver disease.  I advised warm compress to painful areas and PCP follow up.   At this time, I do not feel there is any life-threatening condition present. I have reviewed and discussed all results (EKG, imaging, lab, urine as appropriate), exam findings with patient. I have reviewed nursing notes and appropriate previous records.  I feel the patient is safe to be discharged home without further emergent  workup. Discussed usual and customary return precautions. Patient and family (if present) verbalize understanding and are comfortable with this plan.  Patient will follow-up with their primary care provider. If they do not have a primary care provider, information for follow-up has been provided to them. All questions have been answered.  ____________________________________________  FINAL CLINICAL IMPRESSION(S) / ED DIAGNOSES  Final diagnoses:  Fall, initial encounter  Dysuria  Strain of lumbar region, initial encounter    MEDICATIONS GIVEN DURING THIS VISIT:  Medications  ondansetron (ZOFRAN-ODT) disintegrating tablet 4 mg (4 mg Oral Given 07/12/18 7129)    Note:  This document was prepared using Dragon voice recognition software and may include unintentional dictation errors.  Nanda Quinton, MD Emergency Medicine    Alisha Burgo, [facility], MD 07/12/18 [phone]

## 2018-07-12 NOTE — Discharge Instructions (Signed)

## 2018-07-14 LAB — URINE CULTURE: SPECIAL REQUESTS: NORMAL

## 2018-07-18 ENCOUNTER — Emergency Department (HOSPITAL_COMMUNITY): Payer: Medicaid Other

## 2018-07-18 ENCOUNTER — Encounter (HOSPITAL_COMMUNITY): Payer: Self-pay | Admitting: *Deleted

## 2018-07-18 ENCOUNTER — Other Ambulatory Visit: Payer: Self-pay

## 2018-07-18 ENCOUNTER — Emergency Department (HOSPITAL_COMMUNITY)
Admission: EM | Admit: 2018-07-18 | Discharge: 2018-07-18 | Disposition: A | Discharge status: Home / Self Care | Payer: Medicaid Other | Attending: Emergency Medicine | Admitting: Emergency Medicine

## 2018-07-18 DIAGNOSIS — Z79899 Other long term (current) drug therapy: Secondary | ICD-10-CM | POA: Diagnosis not present

## 2018-07-18 DIAGNOSIS — E119 Type 2 diabetes mellitus without complications: Secondary | ICD-10-CM | POA: Insufficient documentation

## 2018-07-18 DIAGNOSIS — R109 Unspecified abdominal pain: Secondary | ICD-10-CM | POA: Insufficient documentation

## 2018-07-18 DIAGNOSIS — Z87891 Personal history of nicotine dependence: Secondary | ICD-10-CM | POA: Insufficient documentation

## 2018-07-18 DIAGNOSIS — E1165 Type 2 diabetes mellitus with hyperglycemia: Secondary | ICD-10-CM | POA: Diagnosis not present

## 2018-07-18 DIAGNOSIS — Y929 Unspecified place or not applicable: Secondary | ICD-10-CM | POA: Insufficient documentation

## 2018-07-18 DIAGNOSIS — S0003XA Contusion of scalp, initial encounter: Secondary | ICD-10-CM | POA: Diagnosis present

## 2018-07-18 DIAGNOSIS — S7002XA Contusion of left hip, initial encounter: Secondary | ICD-10-CM | POA: Diagnosis not present

## 2018-07-18 DIAGNOSIS — S20212A Contusion of left front wall of thorax, initial encounter: Secondary | ICD-10-CM | POA: Insufficient documentation

## 2018-07-18 DIAGNOSIS — Y9352 Activity, horseback riding: Secondary | ICD-10-CM | POA: Diagnosis not present

## 2018-07-18 DIAGNOSIS — Y999 Unspecified external cause status: Secondary | ICD-10-CM | POA: Insufficient documentation

## 2018-07-18 DIAGNOSIS — R739 Hyperglycemia, unspecified: Secondary | ICD-10-CM

## 2018-07-18 LAB — URINALYSIS, ROUTINE W REFLEX MICROSCOPIC
Bilirubin Urine: NEGATIVE
Hgb urine dipstick: NEGATIVE
KETONES UR: NEGATIVE mg/dL
Leukocytes, UA: NEGATIVE
NITRITE: NEGATIVE
PROTEIN: NEGATIVE mg/dL
SPECIFIC GRAVITY, URINE: 1.04 — AB (ref 1.005–1.030)
pH: 6 (ref 5.0–8.0)

## 2018-07-18 LAB — COMPREHENSIVE METABOLIC PANEL
ALK PHOS: 85 U/L (ref 38–126)
ALT: 36 U/L (ref 0–44)
ANION GAP: 8 (ref 5–15)
AST: 38 U/L (ref 15–41)
Albumin: 3.5 g/dL (ref 3.5–5.0)
BUN: 5 mg/dL — ABNORMAL LOW (ref 6–20)
CALCIUM: 8.9 mg/dL (ref 8.9–10.3)
CO2: 24 mmol/L (ref 22–32)
Chloride: 103 mmol/L (ref 98–111)
Creatinine, Ser: 0.58 mg/dL (ref 0.44–1.00)
GFR calc non Af Amer: >60 mL/min (ref >60)
Glucose, Bld: 393 mg/dL — ABNORMAL HIGH (ref 70–99)
Potassium: 4.5 mmol/L (ref 3.5–5.1)
Sodium: 135 mmol/L (ref 135–145)
TOTAL PROTEIN: 7.5 g/dL (ref 6.5–8.1)
Total Bilirubin: 0.9 mg/dL (ref 0.3–1.2)

## 2018-07-18 LAB — CBC WITH DIFFERENTIAL/PLATELET
Basophils Absolute: 0 10*3/uL (ref 0.0–0.1)
Basophils Relative: 0 %
EOS ABS: 0.1 10*3/uL (ref 0.0–0.7)
EOS PCT: 3 %
HCT: 38.3 % (ref 36.0–46.0)
HEMOGLOBIN: 12.5 g/dL (ref 12.0–15.0)
LYMPHS ABS: 0.7 10*3/uL (ref 0.7–4.0)
Lymphocytes Relative: 23 %
MCH: 29.6 pg (ref 26.0–34.0)
MCHC: 32.6 g/dL (ref 30.0–36.0)
MCV: 90.5 fL (ref 78.0–100.0)
MONOS PCT: 8 %
Monocytes Absolute: 0.2 10*3/uL (ref 0.1–1.0)
NEUTROS PCT: 66 %
Neutro Abs: 2 10*3/uL (ref 1.7–7.7)
PLATELETS: 65 10*3/uL — AB (ref 150–400)
RBC: 4.23 MIL/uL (ref 3.87–5.11)
RDW: 13.2 % (ref 11.5–15.5)
WBC: 3 10*3/uL — AB (ref 4.0–10.5)

## 2018-07-18 LAB — LIPASE, BLOOD: Lipase: 22 U/L (ref 11–51)

## 2018-07-18 LAB — I-STAT BETA HCG BLOOD, ED (MC, WL, AP ONLY): I-stat hCG, quantitative: <5 m[IU]/mL (ref <5)

## 2018-07-18 LAB — CBG MONITORING, ED: GLUCOSE-CAPILLARY: 428 mg/dL — AB (ref 70–99)

## 2018-07-18 MED ORDER — DIPHENHYDRAMINE HCL 50 MG/ML IJ SOLN
25.0000 mg | Freq: Once | INTRAMUSCULAR | Status: AC
  Administered 2018-07-18: 25 mg via INTRAVENOUS
  Filled 2018-07-18: qty 1

## 2018-07-18 MED ORDER — METOCLOPRAMIDE HCL 5 MG/ML IJ SOLN
10.0000 mg | Freq: Once | INTRAMUSCULAR | Status: AC
  Administered 2018-07-18: 10 mg via INTRAVENOUS
  Filled 2018-07-18: qty 2

## 2018-07-18 MED ORDER — INSULIN ASPART 100 UNIT/ML ~~LOC~~ SOLN
5.0000 [IU] | Freq: Once | SUBCUTANEOUS | Status: AC
  Administered 2018-07-18: 5 [IU] via SUBCUTANEOUS
  Filled 2018-07-18: qty 1

## 2018-07-18 MED ORDER — CYCLOBENZAPRINE HCL 10 MG PO TABS: 10.0000 mg | ORAL_TABLET | Freq: Three times a day (TID) | ORAL | 0 refills | Status: DC | PRN

## 2018-07-18 MED ORDER — SODIUM CHLORIDE 0.9 % IV BOLUS
1000.0000 mL | Freq: Once | INTRAVENOUS | Status: AC
  Administered 2018-07-18: 1000 mL via INTRAVENOUS

## 2018-07-18 MED ORDER — ONDANSETRON HCL 4 MG/2ML IJ SOLN
4.0000 mg | Freq: Once | INTRAMUSCULAR | Status: AC
  Administered 2018-07-18: 4 mg via INTRAVENOUS
  Filled 2018-07-18: qty 2

## 2018-07-18 MED ORDER — CYCLOBENZAPRINE HCL 10 MG PO TABS
10.0000 mg | ORAL_TABLET | Freq: Once | ORAL | Status: AC
  Administered 2018-07-18: 10 mg via ORAL
  Filled 2018-07-18: qty 1

## 2018-07-18 NOTE — Discharge Instructions (Signed)
Use ice packs over the painful areas for comfort.  Take the Flexeril for muscle soreness.  Since you cannot take ibuprofen you can take acetaminophen 650 mg every 6 hours for pain as needed.  Return to the ED for any problems listed on the head injury sheet.

## 2018-07-18 NOTE — ED Provider Notes (Signed)
[facility] EMERGENCY DEPARTMENT Provider Note   CSN: [id number] Arrival date & time: 07/18/18  0040  Time seen 2:10 AM.   History   Chief Complaint Chief Complaint  Patient presents with  . Fall    HPI Jaime [name] is a 32 y.o. female.  HPI patient states she has had diabetes since 2016.  She states it has been fairly easy to control.  She states this afternoon she was grooming her horse and they had heard [place] shooting.  After about 45 minutes to hour without hearing any shooting she started to get on her horse.  She states that she swung her leg over the saddle there was another gunshot.  She states she sat down and she had spurs on and her horse started bucking.  She was bucked off and landed on her left side and back.  This happened about 5 PM today.  She states she hit the side of her head however she now has a headache in the back.  She states it started a little while after she fell.  She describes it as a toothache and throbbing.  She has had nausea and vomited once after trying to eat.  She denies any visual changes.  She is concerned because she has splenomegaly from her cirrhosis and she is having some pain in her left upper abdomen and in her left lower abdomen that radiates towards her suprapubic area.  She describes that pain is sharp.  She states has some pain in her left hip and she has pain when she ambulates and she is now limping.  She also complains of pain in her left lateral chest that is not pleuritic although it does make her feel a little short of breath "later on".  She denies any coughing.  PCP Sinda Du, MD   Past Medical History:  Diagnosis Date  . Anxiety   . Chronic abdominal pain   . Chronic back pain   . Cirrhosis ([place])   . Depression   . Diabetes mellitus without complication ([place])   . Hepatitis C   . Insomnia   . Long-term current use of methadone for opiate dependence ([place])   . Migraine headache   . Peptic ulcer     Patient  Active Problem List   Diagnosis Date Noted  . CAP (community acquired pneumonia) 07/17/2016  . Acute hypoxemic respiratory failure ([place]) 07/17/2016  . Pleural effusion on right 07/17/2016  . Multiple rib fractures involving four or more ribs 07/17/2016  . Hemothorax on right 07/17/2016  . Chronic pain disorder 07/17/2016    Past Surgical History:  Procedure Laterality Date  . CHOLECYSTECTOMY       OB History    Gravida      Para      Term      Preterm      AB      Living  0     SAB      TAB      Ectopic      Multiple      Live Births               Home Medications    Prior to Admission medications   Medication Sig Start Date End Date Taking? Authorizing Provider  albuterol (PROVENTIL) (2.5 MG/3ML) 0.083% nebulizer solution Take 2.5 mg by nebulization every 6 (six) hours as needed for wheezing or shortness of breath.    [provider]  butorphanol (STADOL) 10 MG/ML  nasal spray Place 1 spray into the nose every 4 (four) hours as needed (breakthrough pain).    [provider]  cetirizine (ZYRTEC) 10 MG tablet Take 10 mg by mouth every evening.    [provider]  clonazePAM (KLONOPIN) 1 MG tablet Take 1 mg by mouth 4 (four) times daily.     [provider]  cyclobenzaprine (FLEXERIL) 10 MG tablet Take 1 tablet (10 mg total) by mouth 3 (three) times daily as needed for muscle spasms. 07/18/18   Rolland Porter, MD  etonogestrel (NEXPLANON) 68 MG IMPL implant 1 each by Subdermal route once.    [provider]  metFORMIN (GLUCOPHAGE-XR) 500 MG 24 hr tablet Take 1,000 mg by mouth 2 (two) times daily.    [provider]  methadone (DOLOPHINE) 10 MG tablet Take 2 tablets (20 mg total) by mouth every 12 (twelve) hours. Patient taking differently: Take 10 mg by mouth 5 (five) times daily.  07/20/16   Sinda Du, MD  pantoprazole (PROTONIX) 40 MG tablet Take 40 mg by mouth daily.    [provider]    promethazine (PHENERGAN) 25 MG tablet Take 25 mg by mouth daily as needed for nausea or vomiting.    [provider]  ranitidine (ZANTAC) 150 MG tablet Take 150 mg by mouth at bedtime.    [provider]  sucralfate (CARAFATE) 1 g tablet Take 1 g by mouth 4 (four) times daily as needed (ulcers).     [provider]  Suvorexant (BELSOMRA) 20 MG TABS Take 20 mg by mouth at bedtime.     [provider]  tetrahydrozoline 0.05 % ophthalmic solution Place 2 drops into both eyes 2 (two) times daily as needed (allergies/dry/itchy).    [provider]  vortioxetine HBr (TRINTELLIX) 20 MG TABS tablet Take 20 mg by mouth every evening.     [provider]  zolpidem (AMBIEN) 10 MG tablet Take 1 tablet by mouth at bedtime.  03/14/18   [provider]    Family History Family History  Problem Relation Age of Onset  . Hypertension Mother   . Hyperlipidemia Other     Social History Social History   Tobacco Use  . Smoking status: Former Smoker    Packs/day: 0.00    Years: 5.00    Pack years: 0.00  . Smokeless tobacco: Never Used  Substance Use Topics  . Alcohol use: No  . Drug use: No  vapes unemployed   Allergies   Iodine-131; Ketorolac; Ketorolac tromethamine; Ivp dye [iodinated diagnostic agents]; Nsaids; Toradol [ketorolac tromethamine]; and Vancomycin   Review of Systems Review of Systems  All other systems reviewed and are negative.    Physical Exam Updated Vital Signs BP (!) 144/83 (BP Location: Right Arm)   Pulse (!) 108   Temp 98.3 F (36.8 C) (Oral)   Resp 18   Ht 5' 10"  (1.778 m)   Wt 122.5 kg   SpO2 95%   BMI 38.74 kg/m   Vital signs normal except for tachycardia   Physical Exam  Constitutional: She is oriented to person, place, and time. She appears well-developed and well-nourished.  Non-toxic appearance. She does not appear ill. No distress.  obese  HENT:  Head: Normocephalic and atraumatic.   Right Ear: External ear normal.  Left Ear: External ear normal.  Nose: Nose normal. No mucosal edema or rhinorrhea.  Mouth/Throat: Oropharynx is clear and moist and mucous membranes are normal. No dental abscesses or uvula swelling.  Eyes: Pupils are equal, round, and reactive to light. Conjunctivae and EOM are normal.  Neck: Normal range of motion and full passive range of motion without pain. Neck supple.  Cardiovascular: Normal rate, regular rhythm and normal heart sounds. Exam reveals no gallop and no friction rub.  No murmur heard. Pulmonary/Chest: Effort normal and breath sounds normal. No respiratory distress. She has no wheezes. She has no rhonchi. She has no rales. She exhibits tenderness. She exhibits no crepitus.  Tender along her left lateral lower chest wall without crepitance.    Abdominal: Soft. Normal appearance and bowel sounds are normal. She exhibits no distension. There is tenderness in the suprapubic area, left upper quadrant and left lower quadrant. There is no rebound and no guarding.  Musculoskeletal: Normal range of motion. She exhibits no edema or tenderness.       Legs: Moves all extremities well.  She states she is tender in her left hip but she actually points to her left posterior buttock area laterally.  She does not tender over the true hip joint.  She has good range of motion.  Neurological: She is alert and oriented to person, place, and time. She has normal strength. No cranial nerve deficit.  Skin: Skin is warm, dry and intact. No rash noted. No erythema. No pallor.  Psychiatric: She has a normal mood and affect. Her speech is normal and behavior is normal. Her mood appears not anxious.  Nursing note and vitals reviewed.    ED Treatments / Results  Labs (all labs ordered are listed, but only abnormal results are displayed) Results for orders placed or performed during the hospital encounter of 07/18/18  Comprehensive metabolic panel  Result Value Ref  Range   Sodium 135 135 - 145 mmol/L   Potassium 4.5 3.5 - 5.1 mmol/L   Chloride 103 98 - 111 mmol/L   CO2 24 22 - 32 mmol/L   Glucose, Bld 393 (H) 70 - 99 mg/dL   BUN 5 (L) 6 - 20 mg/dL   Creatinine, Ser 0.58 0.44 - 1.00 mg/dL   Calcium 8.9 8.9 - 10.3 mg/dL   Total Protein 7.5 6.5 - 8.1 g/dL   Albumin 3.5 3.5 - 5.0 g/dL   AST 38 15 - 41 U/L   ALT 36 0 - 44 U/L   Alkaline Phosphatase 85 38 - 126 U/L   Total Bilirubin 0.9 0.3 - 1.2 mg/dL   GFR calc non Af Amer >60 >60 mL/min   GFR calc Af Amer >60 >60 mL/min   Anion gap 8 5 - 15  Lipase, blood  Result Value Ref Range   Lipase 22 11 - 51 U/L  CBC with Differential  Result Value Ref Range   WBC 3.0 (L) 4.0 - 10.5 K/uL   RBC 4.23 3.87 - 5.11 MIL/uL   Hemoglobin 12.5 12.0 - 15.0 g/dL   HCT 38.3 36.0 - 46.0 %   MCV 90.5 78.0 - 100.0 fL   MCH 29.6 26.0 - 34.0 pg   MCHC 32.6 30.0 - 36.0 g/dL   RDW 13.2 11.5 - 15.5 %   Platelets 65 (L) 150 - 400 K/uL   Neutrophils Relative % 66 %   Neutro Abs 2.0 1.7 - 7.7 K/uL   Lymphocytes Relative 23 %   Lymphs Abs 0.7 0.7 - 4.0 K/uL   Monocytes Relative 8 %   Monocytes Absolute 0.2 0.1 - 1.0 K/uL   Eosinophils Relative 3 %   Eosinophils Absolute 0.1 0.0 - 0.7  K/uL   Basophils Relative 0 %   Basophils Absolute 0.0 0.0 - 0.1 K/uL  Urinalysis, Routine w reflex microscopic  Result Value Ref Range   Color, Urine YELLOW YELLOW   APPearance CLEAR CLEAR   Specific Gravity, Urine 1.040 (H) 1.005 - 1.030   pH 6.0 5.0 - 8.0   Glucose, UA >=500 (A) NEGATIVE mg/dL   Hgb urine dipstick NEGATIVE NEGATIVE   Bilirubin Urine NEGATIVE NEGATIVE   Ketones, ur NEGATIVE NEGATIVE mg/dL   Protein, ur NEGATIVE NEGATIVE mg/dL   Nitrite NEGATIVE NEGATIVE   Leukocytes, UA NEGATIVE NEGATIVE   RBC / HPF 0-5 0 - 5 RBC/hpf   WBC, UA 0-5 0 - 5 WBC/hpf   Bacteria, UA RARE (A) NONE SEEN   Squamous Epithelial / LPF 11-20 0 - 5   Mucus PRESENT   POC CBG, ED  Result Value Ref Range   Glucose-Capillary 428 (H) 70 -  99 mg/dL  I-Stat beta hCG blood, ED  Result Value Ref Range   I-stat hCG, quantitative <5.0 <5 mIU/mL   Comment 3            Laboratory interpretation all normal except hyperglycemia, low total white blood cell count without neutropenia   EKG None  Radiology Ct Abdomen Pelvis Wo Contrast  Result Date: 07/18/2018 CLINICAL DATA:  Left hip and left flank pain after fall from a horse today. EXAM: CT ABDOMEN AND PELVIS WITHOUT CONTRAST TECHNIQUE: Multidetector CT imaging of the abdomen and pelvis was performed following the standard protocol without IV contrast. COMPARISON:  06/19/2018 FINDINGS: Lower chest: Atelectasis in the lung bases. Hepatobiliary: Surgical absence of the gallbladder. No bile duct dilatation. Liver demonstrates a nodular contour suggesting cirrhosis. No focal lesions identified. Pancreas: Diffuse fatty infiltration of the pancreas. No acute infiltration or edema. Spleen: Spleen is diffusely enlarged. No focal lesions. No subcapsular hematoma. Adrenals/Urinary Tract: No adrenal gland nodules. No renal or ureteral stones. No hydronephrosis or hydroureter. Bladder is unremarkable. Stomach/Bowel: Stomach, small bowel, and colon are not abnormally distended. Stool fills the colon. No appreciable wall thickening. Decompression of small bowel limits evaluation of the wall. Vascular/Lymphatic: No significant vascular findings are present. No enlarged abdominal or pelvic lymph nodes. Reproductive: Uterus and bilateral adnexa are unremarkable. Other: There is stranding in the retroperitoneum, anterior pararenal spaces bilaterally, and in the pericolic gutters, greater on the left. This appearance is unchanged since previous study and may represent chronic edema. No definite mesenteric or retroperitoneal hematoma identified. No free air in the abdomen. Abdominal wall musculature appears intact. Musculoskeletal: Mild degenerative changes in the lumbar spine. Normal alignment. Visualize ribs,  sacrum, pelvis, and hips appear intact. No acute displaced fractures are identified. Lack of IV contrast material limits evaluation of solid organs and vascular structures. IMPRESSION: 1. No acute posttraumatic changes identified. No evidence of solid organ injury or bowel perforation. Evaluation of solid organs and vascular structures is limited without IV contrast material. 2. Changes of hepatic cirrhosis and portal venous hypertension with splenomegaly. 3. Infiltration in the retroperitoneal fat likely represents chronic edema. Similar appearance to previous study. Electronically Signed   By: Lucienne Capers M.D.   On: 07/18/2018 04:13   Dg Ribs Unilateral W/chest Left  Result Date: 07/18/2018 CLINICAL DATA:  Patient fell off of horse today. Left hip and left flank pain. EXAM: LEFT RIBS AND CHEST - 3+ VIEW COMPARISON:  Chest 04/20/2018 FINDINGS: Shallow inspiration. Normal heart size and pulmonary vascularity. No focal airspace disease or consolidation in the lungs. No  blunting of costophrenic angles. No pneumothorax. Mediastinal contours appear intact. The left ribs appear intact. No acute displaced fractures identified. No focal bone lesion or bone destruction. Soft tissues are unremarkable. IMPRESSION: No evidence of active pulmonary disease.  Negative left ribs. Electronically Signed   By: Lucienne Capers M.D.   On: 07/18/2018 04:25   Ct Head Wo Contrast  Result Date: 07/18/2018 CLINICAL DATA:  Posterior headache after fall from horse today. Nausea. EXAM: CT HEAD WITHOUT CONTRAST TECHNIQUE: Contiguous axial images were obtained from the base of the skull through the vertex without intravenous contrast. COMPARISON:  06/30/2010 FINDINGS: Brain: No evidence of acute infarction, hemorrhage, hydrocephalus, extra-axial collection or mass lesion/mass effect. Vascular: No hyperdense vessel or unexpected calcification. Skull: Calvarium appears intact. Sinuses/Orbits: Paranasal sinuses and mastoid air cells  are clear. Other: None. IMPRESSION: No acute intracranial abnormalities. Electronically Signed   By: Lucienne Capers M.D.   On: 07/18/2018 04:05   Dg Hip Unilat W Or Wo Pelvis 2-3 Views Left  Result Date: 07/18/2018 CLINICAL DATA:  Left hip pain after fall from horse today. EXAM: DG HIP (WITH OR WITHOUT PELVIS) 2-3V LEFT COMPARISON:  None. FINDINGS: There is no evidence of hip fracture or dislocation. There is no evidence of arthropathy or other focal bone abnormality. IMPRESSION: Negative. Electronically Signed   By: Lucienne Capers M.D.   On: 07/18/2018 04:25     Dg Lumbar Spine 2-3 Views  Result Date: 07/12/2018 CLINICAL DATA:  Lower back pain after fall.. IMPRESSION: Mild degenerative disc disease is noted at L2-3. No acute abnormality seen in the lumbar spine. Electronically Signed   By: Marijo Conception, M.D.   On: 07/12/2018 09:38   Dg Pelvis 1-2 Views  Result Date: 07/12/2018 CLINICAL DATA:  Lower back pain after fall.  IMPRESSION: Negative. Electronically Signed   By: Marijo Conception, M.D.   On: 07/12/2018 09:37   Procedures Procedures (including critical care time)  Medications Ordered in ED Medications  insulin aspart (novoLOG) injection 5 Units (has no administration in time range)  ondansetron (ZOFRAN) injection 4 mg (4 mg Intravenous Given 07/18/18 0323)  sodium chloride 0.9 % bolus 1,000 mL ( Intravenous Stopped 07/18/18 0451)  metoCLOPramide (REGLAN) injection 10 mg (10 mg Intravenous Given 07/18/18 0323)  diphenhydrAMINE (BENADRYL) injection 25 mg (25 mg Intravenous Given 07/18/18 0322)  cyclobenzaprine (FLEXERIL) tablet 10 mg (10 mg Oral Given 07/18/18 0434)     Initial Impression / Assessment and Plan / ED Course  I have reviewed the triage vital signs and the nursing notes.  Pertinent labs & imaging results that were available during my care of the patient were reviewed by me and considered in my medical decision making (see chart for details).      Please note  patient was seen in the ED on September 10 for a fall.  Scans or x-rays were ordered of the area she states hurts the most.  She was given Flexeril for her pain.  I was considering giving her Toradol however with the possibility of splenic rupture decided to wait.  In addition patient is allergic to Toradol.  4:45 AM I talked to the patient about her radiology studies.  Her glucose was in the high 300s.  She was given 5 units of insulin subcu and discharged home.  I told her I could only give her anti-inflammatories and muscle relaxants for pain.  She has nothing fractured so narcotics would not indicated.  Patient states however in her allergies she  cannot take nonsteroidal anti-inflammatory drugs because of prior ulcers she can take acetaminophen.  Review of the [place] shows patient gets #150 methadone 10 mg tablets monthly, #120 clonazepam 1 mg tablets monthly, #30 Ambien 10 mg tablets monthly from her primary care doctor.  Although all her prescriptions are written by the same physician her overdose risk score is 600.  Final Clinical Impressions(s) / ED Diagnoses   Final diagnoses:  Fall from horse, initial encounter  Contusion of scalp, initial encounter  Contusion of chest, left, initial encounter  Contusion of left hip, initial encounter  Hyperglycemia    ED Discharge Orders         Ordered    cyclobenzaprine (FLEXERIL) 10 MG tablet  3 times daily PRN     07/18/18 0500        OTC acetaminophen  Plan discharge  Rolland Porter, MD, Barbette Or, MD 07/18/18 [phone]

## 2018-07-18 NOTE — ED Triage Notes (Addendum)
Pt states she fell off her horse today after the horse got spooked; pt c/o feeling nauseous and is c/o left hip pain and left flank pain; pt states her cbg has been running high today; pt states at home it has been running in the 400's and pt states she has not eat enough today for it to be that high

## 2018-07-19 ENCOUNTER — Other Ambulatory Visit: Payer: Self-pay

## 2018-07-19 ENCOUNTER — Encounter (HOSPITAL_COMMUNITY): Payer: Self-pay

## 2018-07-19 ENCOUNTER — Emergency Department (HOSPITAL_COMMUNITY)
Admission: EM | Admit: 2018-07-19 | Discharge: 2018-07-19 | Disposition: A | Discharge status: Home / Self Care | Payer: Medicaid Other | Attending: Emergency Medicine | Admitting: Emergency Medicine

## 2018-07-19 DIAGNOSIS — N3001 Acute cystitis with hematuria: Secondary | ICD-10-CM | POA: Diagnosis not present

## 2018-07-19 DIAGNOSIS — Z87891 Personal history of nicotine dependence: Secondary | ICD-10-CM | POA: Insufficient documentation

## 2018-07-19 DIAGNOSIS — Z7984 Long term (current) use of oral hypoglycemic drugs: Secondary | ICD-10-CM | POA: Diagnosis not present

## 2018-07-19 DIAGNOSIS — E1165 Type 2 diabetes mellitus with hyperglycemia: Secondary | ICD-10-CM | POA: Diagnosis not present

## 2018-07-19 DIAGNOSIS — Z79899 Other long term (current) drug therapy: Secondary | ICD-10-CM | POA: Diagnosis not present

## 2018-07-19 DIAGNOSIS — R739 Hyperglycemia, unspecified: Secondary | ICD-10-CM

## 2018-07-19 LAB — CBG MONITORING, ED
GLUCOSE-CAPILLARY: 368 mg/dL — AB (ref 70–99)
GLUCOSE-CAPILLARY: 277 mg/dL — AB (ref 70–99)

## 2018-07-19 LAB — COMPREHENSIVE METABOLIC PANEL
ALK PHOS: 85 U/L (ref 38–126)
ALT: 33 U/L (ref 0–44)
ANION GAP: 8 (ref 5–15)
AST: 33 U/L (ref 15–41)
Albumin: 3.3 g/dL — ABNORMAL LOW (ref 3.5–5.0)
BILIRUBIN TOTAL: 0.6 mg/dL (ref 0.3–1.2)
BUN: 8 mg/dL (ref 6–20)
CO2: 25 mmol/L (ref 22–32)
Calcium: 8.8 mg/dL — ABNORMAL LOW (ref 8.9–10.3)
Chloride: 105 mmol/L (ref 98–111)
Creatinine, Ser: 0.61 mg/dL (ref 0.44–1.00)
GFR calc non Af Amer: >60 mL/min (ref >60)
Glucose, Bld: 370 mg/dL — ABNORMAL HIGH (ref 70–99)
POTASSIUM: 4.1 mmol/L (ref 3.5–5.1)
Sodium: 138 mmol/L (ref 135–145)
TOTAL PROTEIN: 7.1 g/dL (ref 6.5–8.1)

## 2018-07-19 LAB — URINALYSIS, ROUTINE W REFLEX MICROSCOPIC
BILIRUBIN URINE: NEGATIVE
KETONES UR: NEGATIVE mg/dL
LEUKOCYTES UA: NEGATIVE
NITRITE: NEGATIVE
PROTEIN: NEGATIVE mg/dL
Specific Gravity, Urine: 1.013 (ref 1.005–1.030)
pH: 7 (ref 5.0–8.0)

## 2018-07-19 LAB — LIPASE, BLOOD: LIPASE: 21 U/L (ref 11–51)

## 2018-07-19 LAB — CBC
HEMATOCRIT: 35.8 % — AB (ref 36.0–46.0)
HEMOGLOBIN: 11.6 g/dL — AB (ref 12.0–15.0)
MCH: 29.6 pg (ref 26.0–34.0)
MCHC: 32.4 g/dL (ref 30.0–36.0)
MCV: 91.3 fL (ref 78.0–100.0)
Platelets: 69 10*3/uL — ABNORMAL LOW (ref 150–400)
RBC: 3.92 MIL/uL (ref 3.87–5.11)
RDW: 13.4 % (ref 11.5–15.5)
WBC: 3.1 10*3/uL — ABNORMAL LOW (ref 4.0–10.5)

## 2018-07-19 LAB — PREGNANCY, URINE: PREG TEST UR: NEGATIVE

## 2018-07-19 MED ORDER — FENTANYL CITRATE (PF) 100 MCG/2ML IJ SOLN
25.0000 ug | Freq: Once | INTRAMUSCULAR | Status: AC
  Administered 2018-07-19: 25 ug via INTRAVENOUS
  Filled 2018-07-19: qty 2

## 2018-07-19 MED ORDER — SODIUM CHLORIDE 0.9 % IV BOLUS
1000.0000 mL | Freq: Once | INTRAVENOUS | Status: AC
  Administered 2018-07-19: 1000 mL via INTRAVENOUS

## 2018-07-19 MED ORDER — SODIUM CHLORIDE 0.9 % IV SOLN
INTRAVENOUS | Status: DC
  Administered 2018-07-19: 08:00:00 via INTRAVENOUS

## 2018-07-19 MED ORDER — ONDANSETRON HCL 4 MG/2ML IJ SOLN
4.0000 mg | Freq: Once | INTRAMUSCULAR | Status: AC
  Administered 2018-07-19: 4 mg via INTRAVENOUS
  Filled 2018-07-19: qty 2

## 2018-07-19 MED ORDER — CEPHALEXIN 500 MG PO CAPS: 500.0000 mg | ORAL_CAPSULE | Freq: Four times a day (QID) | ORAL | 0 refills | Status: DC

## 2018-07-19 MED ORDER — INSULIN ASPART 100 UNIT/ML ~~LOC~~ SOLN
10.0000 [IU] | Freq: Once | SUBCUTANEOUS | Status: AC
  Administered 2018-07-19: 10 [IU] via INTRAVENOUS
  Filled 2018-07-19: qty 1

## 2018-07-19 MED ORDER — SODIUM CHLORIDE 0.9 % IV SOLN
1.0000 g | Freq: Once | INTRAVENOUS | Status: AC
  Administered 2018-07-19: 1 g via INTRAVENOUS
  Filled 2018-07-19: qty 10

## 2018-07-19 NOTE — ED Triage Notes (Signed)
Pt reports abdominal pain that originally around the first of June, but has got worse the past few days, - reports she is seeing specialist at [facility] for this issue and has appt for colonoscopy Friday morning. pt also reports left rib pain and nausea, pt seen here yesterday for the same. Pt also states sugar has been running high.

## 2018-07-19 NOTE — ED Notes (Signed)
Pt states he would like something for headache. RN aware.

## 2018-07-19 NOTE — Discharge Instructions (Addendum)
Take the antibiotic Keflex as directed for probable urinary tract infection.  Urine culture was sent and is pending.  Continue to follow your blood sugars carefully.  Continue the Flexeril you were given for the muscle pain and soreness.  Make an appointment to follow-up with your primary care doctor in the next few days.

## 2018-07-19 NOTE — ED Provider Notes (Signed)
[facility] EMERGENCY DEPARTMENT Provider Note   CSN: [id number] Arrival date & time: 07/19/18  4259     History   Chief Complaint Chief Complaint  Patient presents with  . Abdominal Pain    left rib pain    HPI Jaime [name] is a 32 y.o. female.  Patient evaluated in the emergency department September 16 following being thrown from horse.  Patient had CTs and x-rays at that time to include a CT of her abdomen.  It was done without contrast because she has dye allergy.  Patient also has a history of chronic pain.  She is on methadone.  Patient also has a history of diabetes.  Patient states that today her blood sugars have been running in the 400s.  This is why she came in.  She is still hurting predominantly on the left side of her chest left side of her abdomen following being thrown from horse.  Patient states that she also got lightheaded last night and did fall from standing position does not think she hurt anything in addition to what was already hurting.  She does not believe she did any further injuries.  Patient is followed by Dr. Luan Pulling.   Patient has a history of opiate dependence.  Also has hepatitis C.     Past Medical History:  Diagnosis Date  . Anxiety   . Chronic abdominal pain   . Chronic back pain   . Cirrhosis ([place])   . Depression   . Diabetes mellitus without complication ([place])   . Hepatitis C   . Insomnia   . Long-term current use of methadone for opiate dependence ([place])   . Migraine headache   . Peptic ulcer     Patient Active Problem List   Diagnosis Date Noted  . CAP (community acquired pneumonia) 07/17/2016  . Acute hypoxemic respiratory failure ([place]) 07/17/2016  . Pleural effusion on right 07/17/2016  . Multiple rib fractures involving four or more ribs 07/17/2016  . Hemothorax on right 07/17/2016  . Chronic pain disorder 07/17/2016    Past Surgical History:  Procedure Laterality Date  . CHOLECYSTECTOMY       OB History    Gravida      Para      Term      Preterm      AB      Living  0     SAB      TAB      Ectopic      Multiple      Live Births               Home Medications    Prior to Admission medications   Medication Sig Start Date End Date Taking? Authorizing Provider  albuterol (PROVENTIL) (2.5 MG/3ML) 0.083% nebulizer solution Take 2.5 mg by nebulization every 6 (six) hours as needed for wheezing or shortness of breath.   Yes [provider]  butorphanol (STADOL) 10 MG/ML nasal spray Place 1 spray into the nose every 4 (four) hours as needed (breakthrough pain).   Yes [provider]  cetirizine (ZYRTEC) 10 MG tablet Take 10 mg by mouth every evening.   Yes [provider]  clonazePAM (KLONOPIN) 1 MG tablet Take 1 mg by mouth 4 (four) times daily.    Yes [provider]  cyclobenzaprine (FLEXERIL) 10 MG tablet Take 1 tablet (10 mg total) by mouth 3 (three) times daily as needed for muscle spasms. 07/18/18  Yes Rolland Porter,  MD  etonogestrel (NEXPLANON) 68 MG IMPL implant 1 each by Subdermal route once.   Yes [provider]  metFORMIN (GLUCOPHAGE-XR) 500 MG 24 hr tablet Take 1,000 mg by mouth 2 (two) times daily.   Yes [provider]  methadone (DOLOPHINE) 10 MG tablet Take 2 tablets (20 mg total) by mouth every 12 (twelve) hours. Patient taking differently: Take 10 mg by mouth 5 (five) times daily.  07/20/16  Yes Sinda Du, MD  pantoprazole (PROTONIX) 40 MG tablet Take 40 mg by mouth daily.   Yes [provider]  prochlorperazine (COMPAZINE) 5 MG tablet Take 1 tablet by mouth every 6 (six) hours as needed for nausea.  07/11/18  Yes [provider]  promethazine (PHENERGAN) 25 MG tablet Take 25 mg by mouth daily as needed for nausea or vomiting.   Yes [provider]  ranitidine (ZANTAC) 150 MG tablet Take 150 mg by mouth at bedtime.   Yes [provider]  sucralfate (CARAFATE) 1 g tablet  Take 1 g by mouth 4 (four) times daily as needed (ulcers).    Yes [provider]  Suvorexant (BELSOMRA) 20 MG TABS Take 20 mg by mouth at bedtime.    Yes [provider]  tetrahydrozoline 0.05 % ophthalmic solution Place 2 drops into both eyes 2 (two) times daily as needed (allergies/dry/itchy).   Yes [provider]  vortioxetine HBr (TRINTELLIX) 20 MG TABS tablet Take 20 mg by mouth every evening.    Yes [provider]  zolpidem (AMBIEN) 10 MG tablet Take 1 tablet by mouth at bedtime.  03/14/18  Yes [provider]  cephALEXin (KEFLEX) 500 MG capsule Take 1 capsule (500 mg total) by mouth 4 (four) times daily. 07/19/18   Fredia Sorrow, MD    Family History Family History  Problem Relation Age of Onset  . Hypertension Mother   . Hyperlipidemia Other     Social History Social History   Tobacco Use  . Smoking status: Former Smoker    Packs/day: 0.00    Years: 5.00    Pack years: 0.00  . Smokeless tobacco: Never Used  Substance Use Topics  . Alcohol use: No  . Drug use: No     Allergies   Iodine-131; Ketorolac; Ketorolac tromethamine; Ivp dye [iodinated diagnostic agents]; Nsaids; Toradol [ketorolac tromethamine]; and Vancomycin   Review of Systems Review of Systems  Constitutional: Negative for fever.  HENT: Negative for congestion.   Respiratory: Negative for shortness of breath.   Cardiovascular: Positive for chest pain.  Gastrointestinal: Positive for abdominal pain.  Genitourinary: Negative for dysuria and hematuria.  Musculoskeletal: Positive for myalgias.  Skin: Negative for rash.  Neurological: Negative for headaches.  Hematological: Does not bruise/bleed easily.  Psychiatric/Behavioral: Negative for confusion.     Physical Exam Updated Vital Signs BP 128/80 (BP Location: Left Arm)   Pulse 84   Temp 97.9 F (36.6 C) (Oral)   Resp 14   Ht 1.778 m (5' 10" )   Wt 122.5 kg   SpO2 99%   BMI 38.74 kg/m    Physical Exam  Constitutional: She is oriented to person, place, and time. She appears well-developed and well-nourished. No distress.  HENT:  Head: Normocephalic and atraumatic.  Mouth/Throat: Oropharynx is clear and moist.  Eyes: Pupils are equal, round, and reactive to light. Conjunctivae and EOM are normal.  Neck: Normal range of motion. Neck supple.  Cardiovascular: Normal rate and regular rhythm.  Pulmonary/Chest: Effort normal and breath sounds normal.  Abdominal: Soft. Bowel sounds are normal. There is no tenderness.  Musculoskeletal: Normal range of motion. She exhibits no edema.  Neurological: She is alert and oriented to person, place, and time. No cranial nerve deficit or sensory deficit. She exhibits normal muscle tone. Coordination normal.  Skin: Skin is warm. Capillary refill takes less than 2 seconds. No rash noted.  Nursing note and vitals reviewed.    ED Treatments / Results  Labs (all labs ordered are listed, but only abnormal results are displayed) Labs Reviewed  COMPREHENSIVE METABOLIC PANEL - Abnormal; Notable for the following components:      Result Value   Glucose, Bld 370 (*)    Calcium 8.8 (*)    Albumin 3.3 (*)    All other components within normal limits  CBC - Abnormal; Notable for the following components:   WBC 3.1 (*)    Hemoglobin 11.6 (*)    HCT 35.8 (*)    Platelets 69 (*)    All other components within normal limits  URINALYSIS, ROUTINE W REFLEX MICROSCOPIC - Abnormal; Notable for the following components:   Glucose, UA >=500 (*)    Hgb urine dipstick LARGE (*)    RBC / HPF >50 (*)    Bacteria, UA RARE (*)    All other components within normal limits  CBG MONITORING, ED - Abnormal; Notable for the following components:   Glucose-Capillary 368 (*)    All other components within normal limits  CBG MONITORING, ED - Abnormal; Notable for the following components:   Glucose-Capillary 277 (*)    All other components within normal limits   URINE CULTURE  LIPASE, BLOOD  PREGNANCY, URINE    EKG None  Radiology Ct Abdomen Pelvis Wo Contrast  Result Date: 07/18/2018 CLINICAL DATA:  Left hip and left flank pain after fall from a horse today. EXAM: CT ABDOMEN AND PELVIS WITHOUT CONTRAST TECHNIQUE: Multidetector CT imaging of the abdomen and pelvis was performed following the standard protocol without IV contrast. COMPARISON:  06/19/2018 FINDINGS: Lower chest: Atelectasis in the lung bases. Hepatobiliary: Surgical absence of the gallbladder. No bile duct dilatation. Liver demonstrates a nodular contour suggesting cirrhosis. No focal lesions identified. Pancreas: Diffuse fatty infiltration of the pancreas. No acute infiltration or edema. Spleen: Spleen is diffusely enlarged. No focal lesions. No subcapsular hematoma. Adrenals/Urinary Tract: No adrenal gland nodules. No renal or ureteral stones. No hydronephrosis or hydroureter. Bladder is unremarkable. Stomach/Bowel: Stomach, small bowel, and colon are not abnormally distended. Stool fills the colon. No appreciable wall thickening. Decompression of small bowel limits evaluation of the wall. Vascular/Lymphatic: No significant vascular findings are present. No enlarged abdominal or pelvic lymph nodes. Reproductive: Uterus and bilateral adnexa are unremarkable. Other: There is stranding in the retroperitoneum, anterior pararenal spaces bilaterally, and in the pericolic gutters, greater on the left. This appearance is unchanged since previous study and may represent chronic edema. No definite mesenteric or retroperitoneal hematoma identified. No free air in the abdomen. Abdominal wall musculature appears intact. Musculoskeletal: Mild degenerative changes in the lumbar spine. Normal alignment. Visualize ribs, sacrum, pelvis, and hips appear intact. No acute displaced fractures are identified. Lack of IV contrast material limits evaluation of solid organs and vascular structures. IMPRESSION: 1. No  acute posttraumatic changes identified. No evidence of solid organ injury or bowel perforation. Evaluation of solid organs and vascular structures is limited without IV contrast material. 2. Changes of hepatic cirrhosis and portal venous hypertension with splenomegaly. 3. Infiltration in the retroperitoneal fat likely represents chronic  edema. Similar appearance to previous study. Electronically Signed   By: Lucienne Capers M.D.   On: 07/18/2018 04:13   Dg Ribs Unilateral W/chest Left  Result Date: 07/18/2018 CLINICAL DATA:  Patient fell off of horse today. Left hip and left flank pain. EXAM: LEFT RIBS AND CHEST - 3+ VIEW COMPARISON:  Chest 04/20/2018 FINDINGS: Shallow inspiration. Normal heart size and pulmonary vascularity. No focal airspace disease or consolidation in the lungs. No blunting of costophrenic angles. No pneumothorax. Mediastinal contours appear intact. The left ribs appear intact. No acute displaced fractures identified. No focal bone lesion or bone destruction. Soft tissues are unremarkable. IMPRESSION: No evidence of active pulmonary disease.  Negative left ribs. Electronically Signed   By: Lucienne Capers M.D.   On: 07/18/2018 04:25   Ct Head Wo Contrast  Result Date: 07/18/2018 CLINICAL DATA:  Posterior headache after fall from horse today. Nausea. EXAM: CT HEAD WITHOUT CONTRAST TECHNIQUE: Contiguous axial images were obtained from the base of the skull through the vertex without intravenous contrast. COMPARISON:  06/30/2010 FINDINGS: Brain: No evidence of acute infarction, hemorrhage, hydrocephalus, extra-axial collection or mass lesion/mass effect. Vascular: No hyperdense vessel or unexpected calcification. Skull: Calvarium appears intact. Sinuses/Orbits: Paranasal sinuses and mastoid air cells are clear. Other: None. IMPRESSION: No acute intracranial abnormalities. Electronically Signed   By: Lucienne Capers M.D.   On: 07/18/2018 04:05   Dg Hip Unilat W Or Wo Pelvis 2-3 Views  Left  Result Date: 07/18/2018 CLINICAL DATA:  Left hip pain after fall from horse today. EXAM: DG HIP (WITH OR WITHOUT PELVIS) 2-3V LEFT COMPARISON:  None. FINDINGS: There is no evidence of hip fracture or dislocation. There is no evidence of arthropathy or other focal bone abnormality. IMPRESSION: Negative. Electronically Signed   By: Lucienne Capers M.D.   On: 07/18/2018 04:25    Procedures Procedures (including critical care time)  Medications Ordered in ED Medications  0.9 %  sodium chloride infusion ( Intravenous New Bag/Given 07/19/18 0753)  sodium chloride 0.9 % bolus 1,000 mL ( Intravenous Stopped 07/19/18 0856)  ondansetron (ZOFRAN) injection 4 mg (4 mg Intravenous Given 07/19/18 0749)  insulin aspart (novoLOG) injection 10 Units (10 Units Intravenous Given 07/19/18 0953)  fentaNYL (SUBLIMAZE) injection 25 mcg (25 mcg Intravenous Given 07/19/18 0952)  cefTRIAXone (ROCEPHIN) 1 g in sodium chloride 0.9 % 100 mL IVPB ( Intravenous Stopped 07/19/18 1023)     Initial Impression / Assessment and Plan / ED Course  I have reviewed the triage vital signs and the nursing notes.  Pertinent labs & imaging results that were available during my care of the patient were reviewed by me and considered in my medical decision making (see chart for details).     Patient evaluated on the 16th following fall from horse.  Had CT scans and x-rays done without any acute injuries.  Patient cannot have CT scans with contrast because she has a contrast allergy.  Patient presents today with the complaint of her blood sugars being high stated they been in the 400s.  Also still has persistent pain predominantly on the left side of her chest.  Patient is on methadone.  Patient is followed by Dr. Luan Pulling.  Work-up here today raises some concern for urinary tract infection.  Urinalysis was normal the night of the CT scan.  CT scan showed no evidence of any renal injury.  Culture sent.  Patient received 1 g of Rocephin  here will be continued on Keflex.  Patient will continue her  Flexeril.  Patient was given 1 dose of fentanyl here IV while in the hospital.  Patient will need to rest continue her Flexeril.  For the elevated blood sugar patient received IV fluids and 10 units of regular insulin blood sugar is now down into the 200s.  No evidence of any DKA.  Patient stable for discharge home and follow-up with Dr. Luan Pulling.  Final Clinical Impressions(s) / ED Diagnoses   Final diagnoses:  Hyperglycemia  Acute cystitis with hematuria    ED Discharge Orders         Ordered    cephALEXin (KEFLEX) 500 MG capsule  4 times daily     07/19/18 1246           Fredia Sorrow, MD 07/19/18 1254

## 2018-07-20 LAB — URINE CULTURE: CULTURE: NO GROWTH

## 2018-09-08 DIAGNOSIS — K921 Melena: Secondary | ICD-10-CM | POA: Insufficient documentation

## 2018-09-15 ENCOUNTER — Emergency Department (HOSPITAL_COMMUNITY): Payer: Medicaid Other

## 2018-09-15 ENCOUNTER — Inpatient Hospital Stay (HOSPITAL_COMMUNITY)
Admission: EM | Admit: 2018-09-15 | Discharge: 2018-09-17 | DRG: 190 | Disposition: A | Discharge status: Home / Self Care | Payer: Medicaid Other | Attending: Pulmonary Disease | Admitting: Pulmonary Disease

## 2018-09-15 ENCOUNTER — Other Ambulatory Visit: Payer: Self-pay

## 2018-09-15 ENCOUNTER — Encounter (HOSPITAL_COMMUNITY): Payer: Self-pay | Admitting: Emergency Medicine

## 2018-09-15 DIAGNOSIS — F329 Major depressive disorder, single episode, unspecified: Secondary | ICD-10-CM | POA: Diagnosis present

## 2018-09-15 DIAGNOSIS — K279 Peptic ulcer, site unspecified, unspecified as acute or chronic, without hemorrhage or perforation: Secondary | ICD-10-CM

## 2018-09-15 DIAGNOSIS — Z8711 Personal history of peptic ulcer disease: Secondary | ICD-10-CM

## 2018-09-15 DIAGNOSIS — J441 Chronic obstructive pulmonary disease with (acute) exacerbation: Principal | ICD-10-CM | POA: Diagnosis present

## 2018-09-15 DIAGNOSIS — F1120 Opioid dependence, uncomplicated: Secondary | ICD-10-CM | POA: Diagnosis present

## 2018-09-15 DIAGNOSIS — Z886 Allergy status to analgesic agent status: Secondary | ICD-10-CM

## 2018-09-15 DIAGNOSIS — G8929 Other chronic pain: Secondary | ICD-10-CM | POA: Diagnosis present

## 2018-09-15 DIAGNOSIS — Z716 Tobacco abuse counseling: Secondary | ICD-10-CM

## 2018-09-15 DIAGNOSIS — G4700 Insomnia, unspecified: Secondary | ICD-10-CM | POA: Diagnosis present

## 2018-09-15 DIAGNOSIS — K7460 Unspecified cirrhosis of liver: Secondary | ICD-10-CM | POA: Diagnosis present

## 2018-09-15 DIAGNOSIS — J4542 Moderate persistent asthma with status asthmaticus: Secondary | ICD-10-CM | POA: Diagnosis present

## 2018-09-15 DIAGNOSIS — Z8249 Family history of ischemic heart disease and other diseases of the circulatory system: Secondary | ICD-10-CM

## 2018-09-15 DIAGNOSIS — Z6839 Body mass index (BMI) 39.0-39.9, adult: Secondary | ICD-10-CM

## 2018-09-15 DIAGNOSIS — R109 Unspecified abdominal pain: Secondary | ICD-10-CM

## 2018-09-15 DIAGNOSIS — J9601 Acute respiratory failure with hypoxia: Secondary | ICD-10-CM | POA: Diagnosis present

## 2018-09-15 DIAGNOSIS — B1920 Unspecified viral hepatitis C without hepatic coma: Secondary | ICD-10-CM | POA: Diagnosis present

## 2018-09-15 DIAGNOSIS — B182 Chronic viral hepatitis C: Secondary | ICD-10-CM | POA: Diagnosis present

## 2018-09-15 DIAGNOSIS — E119 Type 2 diabetes mellitus without complications: Secondary | ICD-10-CM

## 2018-09-15 DIAGNOSIS — Z888 Allergy status to other drugs, medicaments and biological substances status: Secondary | ICD-10-CM

## 2018-09-15 DIAGNOSIS — Z881 Allergy status to other antibiotic agents status: Secondary | ICD-10-CM

## 2018-09-15 DIAGNOSIS — Z79891 Long term (current) use of opiate analgesic: Secondary | ICD-10-CM

## 2018-09-15 DIAGNOSIS — D696 Thrombocytopenia, unspecified: Secondary | ICD-10-CM | POA: Diagnosis present

## 2018-09-15 DIAGNOSIS — F1190 Opioid use, unspecified, uncomplicated: Secondary | ICD-10-CM | POA: Diagnosis present

## 2018-09-15 DIAGNOSIS — Z91041 Radiographic dye allergy status: Secondary | ICD-10-CM

## 2018-09-15 DIAGNOSIS — Z79899 Other long term (current) drug therapy: Secondary | ICD-10-CM

## 2018-09-15 DIAGNOSIS — Z9049 Acquired absence of other specified parts of digestive tract: Secondary | ICD-10-CM

## 2018-09-15 DIAGNOSIS — Z87891 Personal history of nicotine dependence: Secondary | ICD-10-CM

## 2018-09-15 DIAGNOSIS — Z794 Long term (current) use of insulin: Secondary | ICD-10-CM

## 2018-09-15 DIAGNOSIS — F419 Anxiety disorder, unspecified: Secondary | ICD-10-CM | POA: Diagnosis present

## 2018-09-15 LAB — CBC WITH DIFFERENTIAL/PLATELET
ABS IMMATURE GRANULOCYTES: 0.01 10*3/uL (ref 0.00–0.07)
BASOS PCT: 1 %
Basophils Absolute: 0 10*3/uL (ref 0.0–0.1)
EOS ABS: 0.1 10*3/uL (ref 0.0–0.5)
Eosinophils Relative: 2 %
HCT: 41 % (ref 36.0–46.0)
Hemoglobin: 12.8 g/dL (ref 12.0–15.0)
IMMATURE GRANULOCYTES: 0 %
Lymphocytes Relative: 17 %
Lymphs Abs: 0.7 10*3/uL (ref 0.7–4.0)
MCH: 28.6 pg (ref 26.0–34.0)
MCHC: 31.2 g/dL (ref 30.0–36.0)
MCV: 91.7 fL (ref 80.0–100.0)
Monocytes Absolute: 0.4 10*3/uL (ref 0.1–1.0)
Monocytes Relative: 9 %
NEUTROS ABS: 3.1 10*3/uL (ref 1.7–7.7)
NEUTROS PCT: 71 %
PLATELETS: 56 10*3/uL — AB (ref 150–400)
RBC: 4.47 MIL/uL (ref 3.87–5.11)
RDW: 14.1 % (ref 11.5–15.5)
WBC: 4.4 10*3/uL (ref 4.0–10.5)
nRBC: 0 % (ref 0.0–0.2)

## 2018-09-15 LAB — GLUCOSE, CAPILLARY
GLUCOSE-CAPILLARY: 372 mg/dL — AB (ref 70–99)
Glucose-Capillary: 274 mg/dL — ABNORMAL HIGH (ref 70–99)
Glucose-Capillary: 351 mg/dL — ABNORMAL HIGH (ref 70–99)

## 2018-09-15 LAB — I-STAT BETA HCG BLOOD, ED (MC, WL, AP ONLY)

## 2018-09-15 LAB — BASIC METABOLIC PANEL
Anion gap: 9 (ref 5–15)
BUN: 7 mg/dL (ref 6–20)
CALCIUM: 8.7 mg/dL — AB (ref 8.9–10.3)
CO2: 26 mmol/L (ref 22–32)
Chloride: 101 mmol/L (ref 98–111)
Creatinine, Ser: 0.46 mg/dL (ref 0.44–1.00)
Glucose, Bld: 144 mg/dL — ABNORMAL HIGH (ref 70–99)
POTASSIUM: 4.4 mmol/L (ref 3.5–5.1)
SODIUM: 136 mmol/L (ref 135–145)

## 2018-09-15 LAB — MRSA PCR SCREENING: MRSA BY PCR: NEGATIVE

## 2018-09-15 LAB — INFLUENZA PANEL BY PCR (TYPE A & B)
INFLAPCR: NEGATIVE
Influenza B By PCR: NEGATIVE

## 2018-09-15 LAB — BRAIN NATRIURETIC PEPTIDE: B NATRIURETIC PEPTIDE 5: 49 pg/mL (ref 0.0–100.0)

## 2018-09-15 MED ORDER — INSULIN ASPART 100 UNIT/ML ~~LOC~~ SOLN
0.0000 [IU] | Freq: Every day | SUBCUTANEOUS | Status: DC
  Administered 2018-09-15 – 2018-09-16 (×2): 4 [IU] via SUBCUTANEOUS

## 2018-09-15 MED ORDER — CLONAZEPAM 0.5 MG PO TABS
1.0000 mg | ORAL_TABLET | Freq: Four times a day (QID) | ORAL | Status: DC
  Administered 2018-09-15 – 2018-09-17 (×9): 1 mg via ORAL
  Filled 2018-09-15 (×9): qty 2

## 2018-09-15 MED ORDER — SODIUM CHLORIDE 0.9 % IV BOLUS (SEPSIS)
1000.0000 mL | Freq: Once | INTRAVENOUS | Status: AC
  Administered 2018-09-15: 1000 mL via INTRAVENOUS

## 2018-09-15 MED ORDER — METHADONE HCL 10 MG PO TABS
10.0000 mg | ORAL_TABLET | Freq: Every day | ORAL | Status: DC
  Administered 2018-09-15 – 2018-09-17 (×11): 10 mg via ORAL
  Filled 2018-09-15 (×11): qty 1

## 2018-09-15 MED ORDER — PROMETHAZINE HCL 12.5 MG PO TABS
25.0000 mg | ORAL_TABLET | Freq: Every day | ORAL | Status: DC | PRN
  Administered 2018-09-16: 25 mg via ORAL
  Filled 2018-09-15: qty 2

## 2018-09-15 MED ORDER — INSULIN ASPART 100 UNIT/ML ~~LOC~~ SOLN
0.0000 [IU] | Freq: Three times a day (TID) | SUBCUTANEOUS | Status: DC
  Administered 2018-09-15: 11 [IU] via SUBCUTANEOUS
  Administered 2018-09-15: 20 [IU] via SUBCUTANEOUS
  Administered 2018-09-16: 15 [IU] via SUBCUTANEOUS
  Administered 2018-09-16: 11 [IU] via SUBCUTANEOUS
  Administered 2018-09-16: 20 [IU] via SUBCUTANEOUS
  Administered 2018-09-17: 11 [IU] via SUBCUTANEOUS

## 2018-09-15 MED ORDER — IPRATROPIUM-ALBUTEROL 0.5-2.5 (3) MG/3ML IN SOLN
3.0000 mL | Freq: Once | RESPIRATORY_TRACT | Status: AC
  Administered 2018-09-15: 3 mL via RESPIRATORY_TRACT
  Filled 2018-09-15: qty 3

## 2018-09-15 MED ORDER — SODIUM CHLORIDE 0.9% FLUSH
3.0000 mL | Freq: Two times a day (BID) | INTRAVENOUS | Status: DC
  Administered 2018-09-15 – 2018-09-17 (×5): 3 mL via INTRAVENOUS

## 2018-09-15 MED ORDER — ONDANSETRON HCL 4 MG/2ML IJ SOLN
4.0000 mg | Freq: Once | INTRAMUSCULAR | Status: AC
  Administered 2018-09-15: 4 mg via INTRAVENOUS
  Filled 2018-09-15: qty 2

## 2018-09-15 MED ORDER — METHYLPREDNISOLONE SODIUM SUCC 40 MG IJ SOLR
40.0000 mg | Freq: Two times a day (BID) | INTRAMUSCULAR | Status: DC
  Administered 2018-09-15 – 2018-09-17 (×5): 40 mg via INTRAVENOUS
  Filled 2018-09-15 (×5): qty 1

## 2018-09-15 MED ORDER — ZOLPIDEM TARTRATE 5 MG PO TABS
5.0000 mg | ORAL_TABLET | Freq: Every day | ORAL | Status: DC
  Administered 2018-09-15 – 2018-09-16 (×2): 5 mg via ORAL
  Filled 2018-09-15 (×2): qty 1

## 2018-09-15 MED ORDER — ALBUTEROL SULFATE (2.5 MG/3ML) 0.083% IN NEBU: 2.5000 mg | INHALATION_SOLUTION | RESPIRATORY_TRACT | Status: DC | PRN

## 2018-09-15 MED ORDER — FENTANYL CITRATE (PF) 100 MCG/2ML IJ SOLN
50.0000 ug | Freq: Once | INTRAMUSCULAR | Status: AC
  Administered 2018-09-15: 50 ug via INTRAVENOUS
  Filled 2018-09-15: qty 2

## 2018-09-15 MED ORDER — PROCHLORPERAZINE MALEATE 5 MG PO TABS: 5.0000 mg | ORAL_TABLET | Freq: Four times a day (QID) | ORAL | Status: DC | PRN

## 2018-09-15 MED ORDER — ALBUTEROL (5 MG/ML) CONTINUOUS INHALATION SOLN
10.0000 mg/h | INHALATION_SOLUTION | Freq: Once | RESPIRATORY_TRACT | Status: AC
  Administered 2018-09-15: 10 mg/h via RESPIRATORY_TRACT
  Filled 2018-09-15: qty 20

## 2018-09-15 MED ORDER — PREDNISONE 50 MG PO TABS
60.0000 mg | ORAL_TABLET | Freq: Once | ORAL | Status: AC
  Administered 2018-09-15: 60 mg via ORAL
  Filled 2018-09-15: qty 1

## 2018-09-15 MED ORDER — PANTOPRAZOLE SODIUM 40 MG PO TBEC
40.0000 mg | DELAYED_RELEASE_TABLET | Freq: Every day | ORAL | Status: DC
  Administered 2018-09-15 – 2018-09-17 (×3): 40 mg via ORAL
  Filled 2018-09-15 (×3): qty 1

## 2018-09-15 MED ORDER — ALBUTEROL SULFATE (2.5 MG/3ML) 0.083% IN NEBU
2.5000 mg | INHALATION_SOLUTION | Freq: Once | RESPIRATORY_TRACT | Status: AC
  Administered 2018-09-15: 2.5 mg via RESPIRATORY_TRACT
  Filled 2018-09-15: qty 3

## 2018-09-15 MED ORDER — IPRATROPIUM-ALBUTEROL 0.5-2.5 (3) MG/3ML IN SOLN
3.0000 mL | Freq: Four times a day (QID) | RESPIRATORY_TRACT | Status: DC
  Administered 2018-09-15 – 2018-09-17 (×8): 3 mL via RESPIRATORY_TRACT
  Filled 2018-09-15 (×8): qty 3

## 2018-09-15 NOTE — Progress Notes (Signed)
CAT is finished. Pt has increased HR and is complaining of a racing heart. Pt is also shakey

## 2018-09-15 NOTE — Plan of Care (Signed)

## 2018-09-15 NOTE — ED Notes (Signed)
Respiratory paged at this time for continuous neb.

## 2018-09-15 NOTE — ED Triage Notes (Signed)
Patient complains of flu like symptoms. Nausea some vomiting, cough, congestion, and sore throat that has been going on for a few days. Patient also having expiratory wheezing at this time.

## 2018-09-15 NOTE — H&P (Signed)
History and Physical   JOLYSSA OPLINGER XKG:[id number] DOB: 03/23/1986 DOA: 09/15/2018  Referring MD/NP/PA: Dr. Christy Gentles, EDP PCP: Sinda Du, MD  Patient coming from: Home  Chief Complaint: Shortness of breath  HPI: CRISSY MCCREADIE is a 32 y.o. female with a history of opioid use disorder in remission for 9 years, chronic hepatitis C due to IVDU, hepatic cirrhosis, IDT2DM, anxiety, chronic pain on methadone, bronchospasm, and obesity who presented to the ED with abrupt onset of shortness of breath and associated symptoms. Around midnight last night she awoke with severe constant shortness of breath not responsive to home nebulizer therapy, so called EMS. She noted subjective fever, sore throat, and rhinorrhea but no sick contacts and got her flu and pneumonia vaccinations this year.  She also had moderate generalized abdominal pain with some nausea and vomiting which she has regularly.   ED Course: Noted to have a low grade fever, tachycardic, tachypneic with modest improvement with continuous nebulizer and steroids. She was reportedly hypoxic as well. CXR was poor quality and hypoinflated but no focal infiltrate. Labs stable including platelets near baseline at 56k. Hospitalists called due to continued respiratory distress and hypoxia. She feels jittery, anxious, and in pain "all over" for which she usually would take her home clonazepam and methadone.  Review of Systems: No night sweats, weight loss, chest pain, and per HPI. All others reviewed and are negative.   Past Medical History:  Diagnosis Date  . Anxiety   . Chronic abdominal pain   . Chronic back pain   . Cirrhosis ([place])   . Depression   . Diabetes mellitus without complication ([place])   . Hepatitis C   . Insomnia   . Long-term current use of methadone for opiate dependence ([place])   . Migraine headache   . Peptic ulcer    Past Surgical History:  Procedure Laterality Date  . CHOLECYSTECTOMY     - Currently smoking < 5  cigarettes per day, denies EtOH or illicit drugs.  Allergies  Allergen Reactions  . Iodine-131 Shortness Of Breath  . Ketorolac Shortness Of Breath  . Ketorolac Tromethamine Shortness Of Breath  . Ivp Dye [Iodinated Diagnostic Agents] Other (See Comments)    Skin gets very red, unable to walk   . Nsaids Other (See Comments)    Flares ulcers  . Toradol [Ketorolac Tromethamine]     sob  . Vancomycin     Facial swelling,    Family History  Problem Relation Age of Onset  . Hypertension Mother   . Hyperlipidemia Other    - Family history otherwise reviewed and not pertinent. Prior to Admission medications   Medication Sig Start Date End Date Taking? Authorizing Provider  albuterol (PROVENTIL) (2.5 MG/3ML) 0.083% nebulizer solution Take 2.5 mg by nebulization every 6 (six) hours as needed for wheezing or shortness of breath.    [provider]  butorphanol (STADOL) 10 MG/ML nasal spray Place 1 spray into the nose every 4 (four) hours as needed (breakthrough pain).    [provider]  cetirizine (ZYRTEC) 10 MG tablet Take 10 mg by mouth every evening.    [provider]  clonazePAM (KLONOPIN) 1 MG tablet Take 1 mg by mouth 4 (four) times daily.     [provider]  etonogestrel (NEXPLANON) 68 MG IMPL implant 1 each by Subdermal route once.    [provider]  metFORMIN (GLUCOPHAGE-XR) 500 MG 24 hr tablet Take 1,000 mg by mouth 2 (two) times daily.  [provider]  methadone (DOLOPHINE) 10 MG tablet Take 2 tablets (20 mg total) by mouth every 12 (twelve) hours. Patient taking differently: Take 10 mg by mouth 5 (five) times daily.  07/20/16   Sinda Du, MD  pantoprazole (PROTONIX) 40 MG tablet Take 40 mg by mouth daily.    [provider]  prochlorperazine (COMPAZINE) 5 MG tablet Take 1 tablet by mouth every 6 (six) hours as needed for nausea.  07/11/18   [provider]  promethazine (PHENERGAN) 25 MG tablet Take  25 mg by mouth daily as needed for nausea or vomiting.    [provider]  ranitidine (ZANTAC) 150 MG tablet Take 150 mg by mouth at bedtime.    [provider]  sucralfate (CARAFATE) 1 g tablet Take 1 g by mouth 4 (four) times daily as needed (ulcers).     [provider]  Suvorexant (BELSOMRA) 20 MG TABS Take 20 mg by mouth at bedtime.     [provider]  tetrahydrozoline 0.05 % ophthalmic solution Place 2 drops into both eyes 2 (two) times daily as needed (allergies/dry/itchy).    [provider]  vortioxetine HBr (TRINTELLIX) 20 MG TABS tablet Take 20 mg by mouth every evening.     [provider]  zolpidem (AMBIEN) 10 MG tablet Take 1 tablet by mouth at bedtime.  03/14/18   [provider]    Physical Exam: Vitals:   09/15/18 0800 09/15/18 0805 09/15/18 0845 09/15/18 0908  BP:    (!) 139/59  Pulse: (!) 126  (!) 138 (!) 132  Resp: 18  (!) 23 18  Temp:    99.6 F (37.6 C)  TempSrc:    Oral  SpO2: 90% 91% 98% 94%  Weight:      Height:       Constitutional: Obese, tremulous female in mild respiratory distress Eyes: Lids and conjunctivae normal, PERRL ENMT: Mucous membranes are moist. +Torus, there is erythema but otherwise posterior pharynx clear of any exudate or lesions. Fair dentition.  Neck: normal, supple, no masses, no thyromegaly Respiratory: Labored tachypnea with continuous albuterol, diminished air flow with expiratory wheezing. Cardiovascular: Regular tachycardia, no murmurs, rubs, or gallops. No carotid bruits. No JVD. No LE edema. 2+ pedal pulses. Abdomen: Normoactive bowel sounds. No significant tenderness, non-distended, and no masses palpated. No hepatosplenomegaly. GU: No indwelling catheter Musculoskeletal: No clubbing / cyanosis. No joint deformity upper and lower extremities. Good ROM, no contractures. Normal muscle tone.  Skin: Warm, dry. No rashes, wounds, or ulcers. No significant lesions noted.    Neurologic: CN II-XII grossly intact. Gait not assessed. Speech normal. No focal deficits in motor strength or sensation in all extremities.  Psychiatric: Alert and oriented x3. Normal judgment and insight. Mood euthymic with congruent affect.   Labs on Admission: I have personally reviewed following labs and imaging studies  CBC: Recent Labs  Lab 09/15/18 0621  WBC 4.4  NEUTROABS 3.1  HGB 12.8  HCT 41.0  MCV 91.7  PLT 56*   Basic Metabolic Panel: Recent Labs  Lab 09/15/18 0621  NA 136  K 4.4  CL 101  CO2 26  GLUCOSE 144*  BUN 7  CREATININE 0.46  CALCIUM 8.7*   GFR: Estimated Creatinine Clearance: 143.1 mL/min (by C-G formula based on SCr of 0.46 mg/dL). Liver Function Tests: No results for input(s): AST, ALT, ALKPHOS, BILITOT, PROT, ALBUMIN in the last 168 hours. No results for input(s): LIPASE, AMYLASE in the last 168 hours. No results  for input(s): AMMONIA in the last 168 hours. Coagulation Profile: No results for input(s): INR, PROTIME in the last 168 hours. Cardiac Enzymes: No results for input(s): CKTOTAL, CKMB, CKMBINDEX, TROPONINI in the last 168 hours. BNP (last 3 results) No results for input(s): PROBNP in the last 8760 hours. HbA1C: No results for input(s): HGBA1C in the last 72 hours. CBG: No results for input(s): GLUCAP in the last 168 hours. Lipid Profile: No results for input(s): CHOL, HDL, LDLCALC, TRIG, CHOLHDL, LDLDIRECT in the last 72 hours. Thyroid Function Tests: No results for input(s): TSH, T4TOTAL, FREET4, T3FREE, THYROIDAB in the last 72 hours. Anemia Panel: No results for input(s): VITAMINB12, FOLATE, FERRITIN, TIBC, IRON, RETICCTPCT in the last 72 hours. Urine analysis:    Component Value Date/Time   COLORURINE YELLOW 07/19/2018 0616   APPEARANCEUR CLEAR 07/19/2018 0616   LABSPEC 1.013 07/19/2018 0616   PHURINE 7.0 07/19/2018 0616   GLUCOSEU >=500 (A) 07/19/2018 0616   HGBUR LARGE (A) 07/19/2018 0616   BILIRUBINUR NEGATIVE  07/19/2018 0616   KETONESUR NEGATIVE 07/19/2018 0616   PROTEINUR NEGATIVE 07/19/2018 0616   UROBILINOGEN 1.0 10/07/2013 0644   NITRITE NEGATIVE 07/19/2018 0616   LEUKOCYTESUR NEGATIVE 07/19/2018 0616    No results found for this or any previous visit (from the past 240 hour(s)).   Radiological Exams on Admission: Dg Chest Port 1 View  Result Date: 09/15/2018 CLINICAL DATA:  Acute onset of nausea and vomiting. Cough, congestion and sore throat. EXAM: PORTABLE CHEST 1 VIEW COMPARISON:  Chest radiograph performed 07/23/2018 FINDINGS: The lungs are hypoexpanded. Vascular crowding and vascular congestion are seen. There is no evidence of focal opacification, pleural effusion or pneumothorax. The cardiomediastinal silhouette is mildly enlarged. No acute osseous abnormalities are seen. IMPRESSION: Lungs hypoexpanded. Vascular congestion and mild cardiomegaly noted. Lungs remain grossly clear. Electronically Signed   By: Garald Balding M.D.   On: 09/15/2018 06:29    EKG: Independently reviewed. Sinus tachycardia without ischemic changes.   Assessment/Plan Principal Problem:   Acute respiratory failure with hypoxia (HCC) Active Problems:   Anxiety   Chronic abdominal pain   Diabetes mellitus without complication (HCC)   Hepatitis C   Peptic ulcer   Long-term current use of methadone for opiate dependence ([place])   Cirrhosis ([place])   Acute hypoxic respiratory failure: Due to bronchospasm, status asthmaticus vs. COPD exacerbation. No infiltrate on hypoexpanded CXR. - Duonebs scheduled q6h, albuterol q3h prn - Admit to SDU for close monitoring due to ongoing tachypnea - Solumedrol q12h - R/o influenza, droplet precautions until that time. UTD on vaccination for flu, PNA.   Tobacco use:  - Cessation counseling provided  IDT2DM and history of steroid-induced hyperglycemia:  - Minimize steroids if possible - Resistant SSI AC/HS, monitor closely for need to escalate.  - Pt reports taking  70/30 insulin. If hyperglycemic this PM, will start lantus while inpatient.  - Consider HbA1c, defer to PCP  Chronic hepatitis C with hepatic cirrhosis:  - Planning to undergo 12 weeks Tx soon - Avoid hepatotoxins, e.g. tylenol.   Thrombocytopenia:  - Stable from priors, use SCDs for DVT ppx and monitor in AM  Opioid use disorder with history of IVDU and chronic pain disorder:  - Continue home methadone, dose verified with PCP  Anxiety: Worsened with continuous beta agonist.  - Continue home clonazepam  GERD, history of peptic ulcer:  - Continue home PPI, antiemetics, avoid NSAIDs  DVT prophylaxis: SCDs  Code Status: Full  Family Communication: None at bedside Disposition Plan:  Admit to SDU, eventual DC home anticipated pending clinical improvement Consults called: None  Admission status: Observation    Patrecia Pour, MD [facility] [email or web address] Password [email or web address] 09/15/2018, 9:17 AM

## 2018-09-15 NOTE — ED Provider Notes (Signed)
[facility] EMERGENCY DEPARTMENT Provider Note   CSN: [id number] Arrival date & time: 09/15/18  0535     History   Chief Complaint Chief Complaint  Patient presents with  . Influenza    HPI Jaime [name] is a 32 y.o. female.  The history is provided by the patient.  Influenza  Presenting symptoms: cough, diarrhea, fatigue, nausea, shortness of breath and vomiting   Severity:  Moderate Onset quality:  Gradual Progression:  Worsening Chronicity:  New Relieved by:  Nothing Worsened by:  Nothing Associated symptoms: chills    Patient with history of chronic abdominal pain, chronic back pain, diabetes, depression, cirrhosis presents with multiple complaints.  She reports yesterday she had some abdominal pain and diarrhea, tonight she woke up coughing and feeling short of breath.  She reports feeling feverish.  She reports sore throat.  Denies hemoptysis.  She did have some chest pain with her shortness of breath. Past Medical History:  Diagnosis Date  . Anxiety   . Chronic abdominal pain   . Chronic back pain   . Cirrhosis ([place])   . Depression   . Diabetes mellitus without complication ([place])   . Hepatitis C   . Insomnia   . Long-term current use of methadone for opiate dependence ([place])   . Migraine headache   . Peptic ulcer     Patient Active Problem List   Diagnosis Date Noted  . CAP (community acquired pneumonia) 07/17/2016  . Acute hypoxemic respiratory failure ([place]) 07/17/2016  . Pleural effusion on right 07/17/2016  . Multiple rib fractures involving four or more ribs 07/17/2016  . Hemothorax on right 07/17/2016  . Chronic pain disorder 07/17/2016    Past Surgical History:  Procedure Laterality Date  . CHOLECYSTECTOMY       OB History    Gravida      Para      Term      Preterm      AB      Living  0     SAB      TAB      Ectopic      Multiple      Live Births               Home Medications    Prior to Admission  medications   Medication Sig Start Date End Date Taking? Authorizing Provider  albuterol (PROVENTIL) (2.5 MG/3ML) 0.083% nebulizer solution Take 2.5 mg by nebulization every 6 (six) hours as needed for wheezing or shortness of breath.    [provider]  butorphanol (STADOL) 10 MG/ML nasal spray Place 1 spray into the nose every 4 (four) hours as needed (breakthrough pain).    [provider]  cephALEXin (KEFLEX) 500 MG capsule Take 1 capsule (500 mg total) by mouth 4 (four) times daily. 07/19/18   Fredia Sorrow, MD  cetirizine (ZYRTEC) 10 MG tablet Take 10 mg by mouth every evening.    [provider]  clonazePAM (KLONOPIN) 1 MG tablet Take 1 mg by mouth 4 (four) times daily.     [provider]  cyclobenzaprine (FLEXERIL) 10 MG tablet Take 1 tablet (10 mg total) by mouth 3 (three) times daily as needed for muscle spasms. 07/18/18   Rolland Porter, MD  etonogestrel (NEXPLANON) 68 MG IMPL implant 1 each by Subdermal route once.    [provider]  metFORMIN (GLUCOPHAGE-XR) 500 MG 24 hr tablet Take 1,000 mg by mouth 2 (two) times daily.  [provider]  methadone (DOLOPHINE) 10 MG tablet Take 2 tablets (20 mg total) by mouth every 12 (twelve) hours. Patient taking differently: Take 10 mg by mouth 5 (five) times daily.  07/20/16   Sinda Du, MD  pantoprazole (PROTONIX) 40 MG tablet Take 40 mg by mouth daily.    [provider]  prochlorperazine (COMPAZINE) 5 MG tablet Take 1 tablet by mouth every 6 (six) hours as needed for nausea.  07/11/18   [provider]  promethazine (PHENERGAN) 25 MG tablet Take 25 mg by mouth daily as needed for nausea or vomiting.    [provider]  ranitidine (ZANTAC) 150 MG tablet Take 150 mg by mouth at bedtime.    [provider]  sucralfate (CARAFATE) 1 g tablet Take 1 g by mouth 4 (four) times daily as needed (ulcers).     [provider]  Suvorexant (BELSOMRA) 20 MG  TABS Take 20 mg by mouth at bedtime.     [provider]  tetrahydrozoline 0.05 % ophthalmic solution Place 2 drops into both eyes 2 (two) times daily as needed (allergies/dry/itchy).    [provider]  vortioxetine HBr (TRINTELLIX) 20 MG TABS tablet Take 20 mg by mouth every evening.     [provider]  zolpidem (AMBIEN) 10 MG tablet Take 1 tablet by mouth at bedtime.  03/14/18   [provider]    Family History Family History  Problem Relation Age of Onset  . Hypertension Mother   . Hyperlipidemia Other     Social History Social History   Tobacco Use  . Smoking status: Former Smoker    Packs/day: 0.00    Years: 5.00    Pack years: 0.00  . Smokeless tobacco: Never Used  Substance Use Topics  . Alcohol use: No  . Drug use: No     Allergies   Iodine-131; Ketorolac; Ketorolac tromethamine; Ivp dye [iodinated diagnostic agents]; Nsaids; Toradol [ketorolac tromethamine]; and Vancomycin   Review of Systems Review of Systems  Constitutional: Positive for chills and fatigue.  Respiratory: Positive for cough and shortness of breath.   Cardiovascular: Positive for chest pain.  Gastrointestinal: Positive for abdominal pain, diarrhea, nausea and vomiting.  All other systems reviewed and are negative.    Physical Exam Updated Vital Signs BP (!) 144/97 (BP Location: Left Arm)   Pulse (!) 115   Temp 99.1 F (37.3 C) (Oral)   Resp (!) 26   Ht 1.778 m (5' 10" )   Wt 126.1 kg   SpO2 94%   BMI 39.89 kg/m   Physical Exam CONSTITUTIONAL: Chronically ill-appearing HEAD: Normocephalic/atraumatic EYES: EOMI/PERRL ENMT: Mucous membranes moist NECK: supple no meningeal signs SPINE/BACK:entire spine nontender CV: S1/S2 noted, tachycardic  LUNGS: Wheezing bilaterally, crackles at right base ABDOMEN: soft, nontender, no rebound or guarding, bowel sounds noted throughout abdomen GU:no cva tenderness NEURO: Pt is awake/alert/appropriate, moves  all extremitiesx4.  No facial droop.   EXTREMITIES: pulses normal/equal, full ROM, no significant lower extremity edema SKIN: warm, color normal PSYCH: no abnormalities of mood noted, alert and oriented to situation   ED Treatments / Results  Labs (all labs ordered are listed, but only abnormal results are displayed) Labs Reviewed  BASIC METABOLIC PANEL - Abnormal; Notable for the following components:      Result Value   Glucose, Bld 144 (*)    Calcium 8.7 (*)    All other components within normal limits  CBC WITH DIFFERENTIAL/PLATELET - Abnormal; Notable for the following  components:   Platelets 56 (*)    All other components within normal limits  BRAIN NATRIURETIC PEPTIDE  INFLUENZA PANEL BY PCR (TYPE A & B)  I-STAT BETA HCG BLOOD, ED ([facility], [facility], [facility] ONLY)    EKG ED ECG REPORT   Date: 09/15/2018 0645am  Rate: 133  Rhythm: sinus tachycardia  QRS Axis: normal  Intervals: normal  ST/T Wave abnormalities: nonspecific ST changes  Conduction Disutrbances:none  I have personally reviewed the EKG tracing and agree with the computerized printout as noted.  Radiology Dg Chest Port 1 View  Result Date: 09/15/2018 CLINICAL DATA:  Acute onset of nausea and vomiting. Cough, congestion and sore throat. EXAM: PORTABLE CHEST 1 VIEW COMPARISON:  Chest radiograph performed 07/23/2018 FINDINGS: The lungs are hypoexpanded. Vascular crowding and vascular congestion are seen. There is no evidence of focal opacification, pleural effusion or pneumothorax. The cardiomediastinal silhouette is mildly enlarged. No acute osseous abnormalities are seen. IMPRESSION: Lungs hypoexpanded. Vascular congestion and mild cardiomegaly noted. Lungs remain grossly clear. Electronically Signed   By: Garald Balding M.D.   On: 09/15/2018 06:29    Procedures Procedures   CRITICAL CARE Performed by: Sharyon Cable Total critical care time: 45 minutes Critical care time was exclusive of separately billable  procedures and treating other patients. Critical care was necessary to treat or prevent imminent or life-threatening deterioration. Critical care was time spent personally by me on the following activities: development of treatment plan with patient and/or surrogate as well as nursing, discussions with consultants, evaluation of patient's response to treatment, examination of patient, obtaining history from patient or surrogate, ordering and performing treatments and interventions, ordering and review of laboratory studies, ordering and review of radiographic studies, pulse oximetry and re-evaluation of patient's condition. Patient with hypoxia, respiratory failure, requiring oxygen as well as continuous nebulized therapy Medications Ordered in ED Medications  sodium chloride 0.9 % bolus 1,000 mL (1,000 mLs Intravenous New Bag/Given 09/15/18 0802)  ipratropium-albuterol (DUONEB) 0.5-2.5 (3) MG/3ML nebulizer solution 3 mL (3 mLs Nebulization Given 09/15/18 0624)  albuterol (PROVENTIL) (2.5 MG/3ML) 0.083% nebulizer solution 2.5 mg (2.5 mg Nebulization Given 09/15/18 0623)  predniSONE (DELTASONE) tablet 60 mg (60 mg Oral Given 09/15/18 0634)  fentaNYL (SUBLIMAZE) injection 50 mcg (50 mcg Intravenous Given 09/15/18 0633)  ondansetron (ZOFRAN) injection 4 mg (4 mg Intravenous Given 09/15/18 4967)  albuterol (PROVENTIL,VENTOLIN) solution continuous neb (10 mg/hr Nebulization Given 09/15/18 0805)     Initial Impression / Assessment and Plan / ED Course  I have reviewed the triage vital signs and the nursing notes.  Pertinent labs & imaging results that were available during my care of the patient were reviewed by me and considered in my medical decision making (see chart for details). Narcotic database reviewed and considered in decision making    6:21 AM Patient presents with cough/wheezing/shortness of breath.  She also mentions abdominal pain and diarrhea, which she does have a history of chronic  abdominal pain, and currently her abdominal exam is unremarkable. 8:07 AM Patient monitored for several hours and still tachycardic and tachypneic.  She also has a new oxygen requirement.  She continues to have coarse wheezing bilaterally.  Chest x-ray is limited, but no obvious infiltrates.  BNP negative, likely CHF. She reports feeling feverish, chills, sore throat, cough.  FLU possible. She may also eventually develop pneumonia. She reports her abdominal pain is improved, reports this is chronic She reports history of asthma-like illness and similar type infections previously.  I feel she would  benefit from admission due to persistent wheezing, tachycardia and need for oxygen.  D/w dr Bonner Puna for admission   She noted to have chronic thrombocytopenia Final Clinical Impressions(s) / ED Diagnoses   Final diagnoses:  Moderate persistent asthma with status asthmaticus  Acute respiratory failure with hypoxia [facility])    ED Discharge Orders    None       Ripley Fraise, MD 09/15/18 [phone]

## 2018-09-16 DIAGNOSIS — Z716 Tobacco abuse counseling: Secondary | ICD-10-CM | POA: Diagnosis not present

## 2018-09-16 DIAGNOSIS — Z886 Allergy status to analgesic agent status: Secondary | ICD-10-CM | POA: Diagnosis not present

## 2018-09-16 DIAGNOSIS — Z9049 Acquired absence of other specified parts of digestive tract: Secondary | ICD-10-CM | POA: Diagnosis not present

## 2018-09-16 DIAGNOSIS — Z91041 Radiographic dye allergy status: Secondary | ICD-10-CM | POA: Diagnosis not present

## 2018-09-16 DIAGNOSIS — Z8249 Family history of ischemic heart disease and other diseases of the circulatory system: Secondary | ICD-10-CM | POA: Diagnosis not present

## 2018-09-16 DIAGNOSIS — B182 Chronic viral hepatitis C: Secondary | ICD-10-CM | POA: Diagnosis present

## 2018-09-16 DIAGNOSIS — Z87891 Personal history of nicotine dependence: Secondary | ICD-10-CM | POA: Diagnosis not present

## 2018-09-16 DIAGNOSIS — Z79891 Long term (current) use of opiate analgesic: Secondary | ICD-10-CM | POA: Diagnosis not present

## 2018-09-16 DIAGNOSIS — Z6839 Body mass index (BMI) 39.0-39.9, adult: Secondary | ICD-10-CM | POA: Diagnosis not present

## 2018-09-16 DIAGNOSIS — J4542 Moderate persistent asthma with status asthmaticus: Secondary | ICD-10-CM | POA: Diagnosis present

## 2018-09-16 DIAGNOSIS — J441 Chronic obstructive pulmonary disease with (acute) exacerbation: Secondary | ICD-10-CM | POA: Diagnosis present

## 2018-09-16 DIAGNOSIS — K7460 Unspecified cirrhosis of liver: Secondary | ICD-10-CM | POA: Diagnosis present

## 2018-09-16 DIAGNOSIS — F329 Major depressive disorder, single episode, unspecified: Secondary | ICD-10-CM | POA: Diagnosis present

## 2018-09-16 DIAGNOSIS — G8929 Other chronic pain: Secondary | ICD-10-CM | POA: Diagnosis present

## 2018-09-16 DIAGNOSIS — J9601 Acute respiratory failure with hypoxia: Secondary | ICD-10-CM | POA: Diagnosis present

## 2018-09-16 DIAGNOSIS — D696 Thrombocytopenia, unspecified: Secondary | ICD-10-CM | POA: Diagnosis present

## 2018-09-16 DIAGNOSIS — G4700 Insomnia, unspecified: Secondary | ICD-10-CM | POA: Diagnosis present

## 2018-09-16 DIAGNOSIS — Z881 Allergy status to other antibiotic agents status: Secondary | ICD-10-CM | POA: Diagnosis not present

## 2018-09-16 DIAGNOSIS — Z8711 Personal history of peptic ulcer disease: Secondary | ICD-10-CM | POA: Diagnosis not present

## 2018-09-16 DIAGNOSIS — E119 Type 2 diabetes mellitus without complications: Secondary | ICD-10-CM | POA: Diagnosis present

## 2018-09-16 DIAGNOSIS — Z888 Allergy status to other drugs, medicaments and biological substances status: Secondary | ICD-10-CM | POA: Diagnosis not present

## 2018-09-16 DIAGNOSIS — Z79899 Other long term (current) drug therapy: Secondary | ICD-10-CM | POA: Diagnosis not present

## 2018-09-16 DIAGNOSIS — F419 Anxiety disorder, unspecified: Secondary | ICD-10-CM | POA: Diagnosis present

## 2018-09-16 LAB — GLUCOSE, CAPILLARY
GLUCOSE-CAPILLARY: 331 mg/dL — AB (ref 70–99)
GLUCOSE-CAPILLARY: 371 mg/dL — AB (ref 70–99)
Glucose-Capillary: 271 mg/dL — ABNORMAL HIGH (ref 70–99)
Glucose-Capillary: 319 mg/dL — ABNORMAL HIGH (ref 70–99)

## 2018-09-16 LAB — CBC
HCT: 32.6 % — ABNORMAL LOW (ref 36.0–46.0)
Hemoglobin: 10.3 g/dL — ABNORMAL LOW (ref 12.0–15.0)
MCH: 28.8 pg (ref 26.0–34.0)
MCHC: 31.6 g/dL (ref 30.0–36.0)
MCV: 91.1 fL (ref 80.0–100.0)
PLATELETS: 51 10*3/uL — AB (ref 150–400)
RBC: 3.58 MIL/uL — ABNORMAL LOW (ref 3.87–5.11)
RDW: 14.1 % (ref 11.5–15.5)
WBC: 2.9 10*3/uL — AB (ref 4.0–10.5)
nRBC: 0 % (ref 0.0–0.2)

## 2018-09-16 MED ORDER — GUAIFENESIN ER 600 MG PO TB12
1200.0000 mg | ORAL_TABLET | Freq: Two times a day (BID) | ORAL | Status: DC
  Administered 2018-09-16 – 2018-09-17 (×3): 1200 mg via ORAL
  Filled 2018-09-16 (×3): qty 2

## 2018-09-16 MED ORDER — SODIUM CHLORIDE 0.9 % IV SOLN
1.0000 g | INTRAVENOUS | Status: DC
  Administered 2018-09-16: 1 g via INTRAVENOUS
  Filled 2018-09-16 (×5): qty 10

## 2018-09-16 NOTE — Progress Notes (Signed)
Inpatient Diabetes Program Recommendations  AACE/ADA: New Consensus Statement on Inpatient Glycemic Control (2015)  Target Ranges:  Prepandial:   less than 140 mg/dL      Peak postprandial:   less than 180 mg/dL (1-2 hours)      Critically ill patients:  140 - 180 mg/dL   Results for Jaime [name], Jaime [name] (MRN [id number]) as of 09/16/2018 11:48  Ref. Range 09/15/2018 11:46 09/15/2018 16:19 09/15/2018 21:12 09/16/2018 09:02  Glucose-Capillary Latest Ref Range: 70 - 99 mg/dL 274 (H) 351 (H) 372 (H) 331 (H)   Review of Glycemic Control  Diabetes history: DM 2 Outpatient Diabetes medications: 70/30 20 units BID (28 units long acting, 12 units short acting), Metformin 1000 mg BID Current orders for Inpatient glycemic control: Novolog 0-20 units tid, Novolog 0-5 units qhs  Inpatient Diabetes Program Recommendations:    Patient receiving IV Solumedrol 40 mg Q12 hours. Glucose trends in the 300 range. Patient takes 70/30 insulin with long and short acting components at home.  Consider Lantus 14 units (half of home equivalent of long acting insulin). Consider also Novolog 3 units tid meal coverage if patient is consuming at least 50% of meals.  Thanks,  Tama Headings RN, MSN, BC-ADM Inpatient Diabetes Coordinator Team Pager [phone] (8a-5p)

## 2018-09-16 NOTE — Progress Notes (Signed)
Subjective: She says she feels better.  She was admitted yesterday with asthma/COPD exacerbation.  She is coughing up discolored sputum.  Objective: Vital signs in last 24 hours: Temp:  [97.5 F (36.4 C)-99.7 F (37.6 C)] 98.1 F (36.7 C) (11/15 0140) Pulse Rate:  [87-138] 87 (11/15 0600) Resp:  [13-38] 17 (11/15 0600) BP: (112-139)/(44-77) 123/73 (11/15 0600) SpO2:  [87 %-99 %] 91 % (11/15 0600) Weight:  [129.9 kg] 129.9 kg (11/14 1108) Weight change: 3.8 kg Last BM Date: 09/14/18  Intake/Output from previous day: 11/14 0701 - 11/15 0700 In: 2003 [P.O.:1000; I.V.:3; IV Piggyback:1000] Out: 501 [Urine:500; Stool:1]  PHYSICAL EXAM General appearance: alert, cooperative and no distress Resp: clear to auscultation bilaterally Cardio: regular rate and rhythm, S1, S2 normal, no murmur, click, rub or gallop GI: soft, non-tender; bowel sounds normal; no masses,  no organomegaly Extremities: extremities normal, atraumatic, no cyanosis or edema  Lab Results:  Results for orders placed or performed during the hospital encounter of 09/15/18 (from the past 48 hour(s))  Basic metabolic panel     Status: Abnormal   Collection Time: 09/15/18  6:21 AM  Result Value Ref Range   Sodium 136 135 - 145 mmol/L   Potassium 4.4 3.5 - 5.1 mmol/L   Chloride 101 98 - 111 mmol/L   CO2 26 22 - 32 mmol/L   Glucose, Bld 144 (H) 70 - 99 mg/dL   BUN 7 6 - 20 mg/dL   Creatinine, Ser 0.46 0.44 - 1.00 mg/dL   Calcium 8.7 (L) 8.9 - 10.3 mg/dL   GFR calc non Af Amer >60 >60 mL/min   GFR calc Af Amer >60 >60 mL/min    Comment: (NOTE) The eGFR has been calculated using the CKD EPI equation. This calculation has not been validated in all clinical situations. eGFR's persistently <60 mL/min signify possible Chronic Kidney Disease.    Anion gap 9 5 - 15    Comment: Performed at [facility], [address] [place], [place] [address]  CBC with Differential/Platelet     Status: Abnormal   Collection Time:  09/15/18  6:21 AM  Result Value Ref Range   WBC 4.4 4.0 - 10.5 K/uL   RBC 4.47 3.87 - 5.11 MIL/uL   Hemoglobin 12.8 12.0 - 15.0 g/dL   HCT 41.0 36.0 - 46.0 %   MCV 91.7 80.0 - 100.0 fL   MCH 28.6 26.0 - 34.0 pg   MCHC 31.2 30.0 - 36.0 g/dL   RDW 14.1 11.5 - 15.5 %   Platelets 56 (L) 150 - 400 K/uL    Comment: REPEATED TO VERIFY PLATELET COUNT CONFIRMED BY SMEAR SPECIMEN CHECKED FOR CLOTS Immature Platelet Fraction may be clinically indicated, consider ordering this additional test [id number]    nRBC 0.0 0.0 - 0.2 %   Neutrophils Relative % 71 %   Neutro Abs 3.1 1.7 - 7.7 K/uL   Lymphocytes Relative 17 %   Lymphs Abs 0.7 0.7 - 4.0 K/uL   Monocytes Relative 9 %   Monocytes Absolute 0.4 0.1 - 1.0 K/uL   Eosinophils Relative 2 %   Eosinophils Absolute 0.1 0.0 - 0.5 K/uL   Basophils Relative 1 %   Basophils Absolute 0.0 0.0 - 0.1 K/uL   Immature Granulocytes 0 %   Abs Immature Granulocytes 0.01 0.00 - 0.07 K/uL    Comment: Performed at [facility], [address], [place], [place] [address]  Brain natriuretic peptide     Status: None   Collection Time:  09/15/18  6:21 AM  Result Value Ref Range   B Natriuretic Peptide 49.0 0.0 - 100.0 pg/mL    Comment: Performed at [facility], [address], [place], [place] [address]  I-Stat Beta hCG blood, ED ([facility], [facility], [facility] only)     Status: None   Collection Time: 09/15/18  6:26 AM  Result Value Ref Range   I-stat hCG, quantitative <5.0 <5 mIU/mL   Comment 3            Comment:   GEST. AGE      CONC.  (mIU/mL)   <=1 WEEK        5 - 50     2 WEEKS       50 - 500     3 WEEKS       100 - 10,000     4 WEEKS     1,000 - 30,000        FEMALE AND NON-PREGNANT FEMALE:     LESS THAN 5 mIU/mL   Influenza panel by PCR (type A & B)     Status: None   Collection Time: 09/15/18  9:10 AM  Result Value Ref Range   Influenza A By PCR NEGATIVE NEGATIVE   Influenza B By PCR NEGATIVE NEGATIVE    Comment: (NOTE) The Xpert Xpress Flu assay is intended as  an aid in the diagnosis of  influenza and should not be used as a sole basis for treatment.  This  assay is FDA approved for nasopharyngeal swab specimens only. Nasal  washings and aspirates are unacceptable for Xpert Xpress Flu testing. Performed at [facility], [address] [place], [place] [address]   MRSA PCR Screening     Status: None   Collection Time: 09/15/18 11:15 AM  Result Value Ref Range   MRSA by PCR NEGATIVE NEGATIVE    Comment:        The GeneXpert MRSA Assay (FDA approved for NASAL specimens only), is one component of a comprehensive MRSA colonization surveillance program. It is not intended to diagnose MRSA infection nor to guide or monitor treatment for MRSA infections. Performed at [facility], [address] [place], [place] [address]   Glucose, capillary     Status: Abnormal   Collection Time: 09/15/18 11:46 AM  Result Value Ref Range   Glucose-Capillary 274 (H) 70 - 99 mg/dL  Glucose, capillary     Status: Abnormal   Collection Time: 09/15/18  4:19 PM  Result Value Ref Range   Glucose-Capillary 351 (H) 70 - 99 mg/dL   Comment 1 Notify RN    Comment 2 Document in Chart   Glucose, capillary     Status: Abnormal   Collection Time: 09/15/18  9:12 PM  Result Value Ref Range   Glucose-Capillary 372 (H) 70 - 99 mg/dL   Comment 1 Notify RN    Comment 2 Document in Chart   CBC     Status: Abnormal   Collection Time: 09/16/18  4:48 AM  Result Value Ref Range   WBC 2.9 (L) 4.0 - 10.5 K/uL   RBC 3.58 (L) 3.87 - 5.11 MIL/uL   Hemoglobin 10.3 (L) 12.0 - 15.0 g/dL   HCT 32.6 (L) 36.0 - 46.0 %   MCV 91.1 80.0 - 100.0 fL   MCH 28.8 26.0 - 34.0 pg   MCHC 31.6 30.0 - 36.0 g/dL   RDW 14.1 11.5 - 15.5 %   Platelets 51 (L) 150 - 400 K/uL  Comment: REPEATED TO VERIFY SPECIMEN CHECKED FOR CLOTS Immature Platelet Fraction may be clinically indicated, consider ordering this additional test [id number] CONSISTENT WITH PREVIOUS RESULT    nRBC 0.0 0.0 - 0.2 %     Comment: Performed at [facility], [address] [place], [place] [address]    ABGS No results for input(s): PHART, PO2ART, TCO2, HCO3 in the last 72 hours.  Invalid input(s): PCO2 CULTURES Recent Results (from the past 240 hour(s))  MRSA PCR Screening     Status: None   Collection Time: 09/15/18 11:15 AM  Result Value Ref Range Status   MRSA by PCR NEGATIVE NEGATIVE Final    Comment:        The GeneXpert MRSA Assay (FDA approved for NASAL specimens only), is one component of a comprehensive MRSA colonization surveillance program. It is not intended to diagnose MRSA infection nor to guide or monitor treatment for MRSA infections. Performed at [facility], [address] [place], [place] [address]    Studies/Results: Dg Chest Port 1 View  Result Date: 09/15/2018 CLINICAL DATA:  Acute onset of nausea and vomiting. Cough, congestion and sore throat. EXAM: PORTABLE CHEST 1 VIEW COMPARISON:  Chest radiograph performed 07/23/2018 FINDINGS: The lungs are hypoexpanded. Vascular crowding and vascular congestion are seen. There is no evidence of focal opacification, pleural effusion or pneumothorax. The cardiomediastinal silhouette is mildly enlarged. No acute osseous abnormalities are seen. IMPRESSION: Lungs hypoexpanded. Vascular congestion and mild cardiomegaly noted. Lungs remain grossly clear. Electronically Signed   By: Garald Balding M.D.   On: 09/15/2018 06:29    Medications:  Prior to Admission:  Medications Prior to Admission  Medication Sig Dispense Refill Last Dose  . albuterol (PROVENTIL) (2.5 MG/3ML) 0.083% nebulizer solution Take 2.5 mg by nebulization every 6 (six) hours as needed for wheezing or shortness of breath.   Past Week at Unknown time  . amoxicillin (AMOXIL) 500 MG capsule Take 1 capsule by mouth 3 (three) times daily.   09/14/2018 at Unknown time  . butorphanol (STADOL) 10 MG/ML nasal spray Place 1 spray into the nose every 4 (four) hours as needed  (breakthrough pain).   Past Week at Unknown time  . cetirizine (ZYRTEC) 10 MG tablet Take 10 mg by mouth every evening.   09/14/2018 at Unknown time  . clonazePAM (KLONOPIN) 1 MG tablet Take 1 mg by mouth 4 (four) times daily.    09/14/2018 at Unknown time  . diclofenac sodium (VOLTAREN) 1 % GEL Apply 2 g topically 4 (four) times daily as needed.  99 Past Week at Unknown time  . etonogestrel (NEXPLANON) 68 MG IMPL implant 1 each by Subdermal route once.   09/15/2018 at Unknown time  . insulin NPH-regular Human (70-30) 100 UNIT/ML injection Inject 20 Units into the skin 2 (two) times daily with a meal.   09/14/2018 at Unknown time  . lactulose (CHRONULAC) 10 GM/15ML solution Take 15 mLs by mouth 3 (three) times daily.   09/14/2018 at Unknown time  . metFORMIN (GLUCOPHAGE-XR) 500 MG 24 hr tablet Take 1,000 mg by mouth 2 (two) times daily.   09/14/2018 at Unknown time  . methadone (DOLOPHINE) 10 MG tablet Take 2 tablets (20 mg total) by mouth every 12 (twelve) hours. (Patient taking differently: Take 10 mg by mouth 5 (five) times daily. ) 120 tablet 0 09/14/2018 at Unknown time  . pantoprazole (PROTONIX) 40 MG tablet Take 40 mg by mouth daily.   09/14/2018 at Unknown time  . potassium chloride SA (K-DUR,KLOR-CON)  20 MEQ tablet Take 2 tablets by mouth 2 (two) times daily.   09/14/2018 at Unknown time  . prochlorperazine (COMPAZINE) 5 MG tablet Take 1 tablet by mouth every 6 (six) hours as needed for nausea.   0 Past Week at Unknown time  . promethazine (PHENERGAN) 25 MG tablet Take 25 mg by mouth daily as needed for nausea or vomiting.   Past Week at Unknown time  . ranitidine (ZANTAC) 150 MG tablet Take 150 mg by mouth at bedtime.   09/14/2018 at Unknown time  . spironolactone (ALDACTONE) 50 MG tablet Take 2 tablets by mouth daily.   09/14/2018 at Unknown time  . sucralfate (CARAFATE) 1 g tablet Take 1 g by mouth 4 (four) times daily as needed (ulcers).    07/18/2018 at Unknown time  . Suvorexant  (BELSOMRA) 20 MG TABS Take 20 mg by mouth at bedtime.    09/14/2018 at Unknown time  . tetrahydrozoline 0.05 % ophthalmic solution Place 2 drops into both eyes 2 (two) times daily as needed (allergies/dry/itchy).   07/18/2018 at Unknown time  . torsemide (DEMADEX) 20 MG tablet Take 2 tablets by mouth daily.   09/14/2018 at Unknown time  . vortioxetine HBr (TRINTELLIX) 20 MG TABS tablet Take 20 mg by mouth every evening.    09/14/2018 at Unknown time  . zolpidem (AMBIEN) 10 MG tablet Take 1 tablet by mouth at bedtime.   5 09/14/2018 at Unknown time  . metolazone (ZAROXOLYN) 2.5 MG tablet Take 2 tablets by mouth daily.  0    Scheduled: . clonazePAM  1 mg Oral QID  . guaiFENesin  1,200 mg Oral BID  . insulin aspart  0-20 Units Subcutaneous TID WC  . insulin aspart  0-5 Units Subcutaneous QHS  . ipratropium-albuterol  3 mL Nebulization Q6H  . methadone  10 mg Oral 5 X Daily  . methylPREDNISolone (SOLU-MEDROL) injection  40 mg Intravenous Q12H  . pantoprazole  40 mg Oral Daily  . sodium chloride flush  3 mL Intravenous Q12H  . zolpidem  5 mg Oral QHS   Continuous: . cefTRIAXone (ROCEPHIN)  IV     [id number], prochlorperazine, promethazine  Assesment: She was admitted with acute hypoxic respiratory failure with asthma/COPD exacerbation.  She is significantly better.  She has diabetes and her blood sugars up likely from the steroids  She has hepatitis C and she is going to start treatment for that  She has chronic back pain and is on methadone for that.  She has cirrhosis of the liver probably related to hep C and fatty infiltration Principal Problem:   Acute respiratory failure with hypoxia (HCC) Active Problems:   Anxiety   Chronic abdominal pain   Diabetes mellitus without complication (HCC)   Hepatitis C   Peptic ulcer   Long-term current use of methadone for opiate dependence ([place])   Cirrhosis ([place])    Plan: Transfer from stepdown.  Add antibiotic.  Probable discharge in  the morning    LOS: 0 days   Zaiden [place]um L 09/16/2018, 8:42 AM

## 2018-09-17 ENCOUNTER — Emergency Department (HOSPITAL_COMMUNITY): Payer: Medicaid Other

## 2018-09-17 ENCOUNTER — Encounter (HOSPITAL_COMMUNITY): Payer: Self-pay

## 2018-09-17 ENCOUNTER — Other Ambulatory Visit: Payer: Self-pay

## 2018-09-17 ENCOUNTER — Inpatient Hospital Stay (HOSPITAL_COMMUNITY)
Admission: EM | Admit: 2018-09-17 | Discharge: 2018-09-21 | DRG: 190 | Disposition: A | Discharge status: Home Health | Payer: Medicaid Other | Attending: Pulmonary Disease | Admitting: Pulmonary Disease

## 2018-09-17 DIAGNOSIS — G8929 Other chronic pain: Secondary | ICD-10-CM

## 2018-09-17 DIAGNOSIS — Z9981 Dependence on supplemental oxygen: Secondary | ICD-10-CM

## 2018-09-17 DIAGNOSIS — IMO0002 Reserved for concepts with insufficient information to code with codable children: Secondary | ICD-10-CM | POA: Diagnosis present

## 2018-09-17 DIAGNOSIS — J441 Chronic obstructive pulmonary disease with (acute) exacerbation: Principal | ICD-10-CM | POA: Diagnosis present

## 2018-09-17 DIAGNOSIS — J9601 Acute respiratory failure with hypoxia: Secondary | ICD-10-CM

## 2018-09-17 DIAGNOSIS — Z8711 Personal history of peptic ulcer disease: Secondary | ICD-10-CM

## 2018-09-17 DIAGNOSIS — Z8249 Family history of ischemic heart disease and other diseases of the circulatory system: Secondary | ICD-10-CM

## 2018-09-17 DIAGNOSIS — J9621 Acute and chronic respiratory failure with hypoxia: Secondary | ICD-10-CM | POA: Diagnosis present

## 2018-09-17 DIAGNOSIS — R739 Hyperglycemia, unspecified: Secondary | ICD-10-CM | POA: Diagnosis present

## 2018-09-17 DIAGNOSIS — Z794 Long term (current) use of insulin: Secondary | ICD-10-CM

## 2018-09-17 DIAGNOSIS — J45901 Unspecified asthma with (acute) exacerbation: Secondary | ICD-10-CM | POA: Diagnosis present

## 2018-09-17 DIAGNOSIS — G43909 Migraine, unspecified, not intractable, without status migrainosus: Secondary | ICD-10-CM | POA: Diagnosis present

## 2018-09-17 DIAGNOSIS — E1165 Type 2 diabetes mellitus with hyperglycemia: Secondary | ICD-10-CM | POA: Diagnosis present

## 2018-09-17 DIAGNOSIS — F319 Bipolar disorder, unspecified: Secondary | ICD-10-CM | POA: Diagnosis present

## 2018-09-17 DIAGNOSIS — G894 Chronic pain syndrome: Secondary | ICD-10-CM | POA: Diagnosis present

## 2018-09-17 DIAGNOSIS — K7460 Unspecified cirrhosis of liver: Secondary | ICD-10-CM | POA: Diagnosis present

## 2018-09-17 DIAGNOSIS — Z87891 Personal history of nicotine dependence: Secondary | ICD-10-CM

## 2018-09-17 DIAGNOSIS — F1120 Opioid dependence, uncomplicated: Secondary | ICD-10-CM | POA: Diagnosis present

## 2018-09-17 DIAGNOSIS — Z79891 Long term (current) use of opiate analgesic: Secondary | ICD-10-CM

## 2018-09-17 DIAGNOSIS — R109 Unspecified abdominal pain: Secondary | ICD-10-CM

## 2018-09-17 DIAGNOSIS — B182 Chronic viral hepatitis C: Secondary | ICD-10-CM | POA: Diagnosis present

## 2018-09-17 DIAGNOSIS — Z6841 Body Mass Index (BMI) 40.0 and over, adult: Secondary | ICD-10-CM

## 2018-09-17 LAB — URINALYSIS, ROUTINE W REFLEX MICROSCOPIC
Bacteria, UA: NONE SEEN
Bilirubin Urine: NEGATIVE
Hgb urine dipstick: NEGATIVE
KETONES UR: NEGATIVE mg/dL
LEUKOCYTES UA: NEGATIVE
NITRITE: NEGATIVE
PH: 7 (ref 5.0–8.0)
Protein, ur: NEGATIVE mg/dL
Specific Gravity, Urine: 1.032 — ABNORMAL HIGH (ref 1.005–1.030)

## 2018-09-17 LAB — CBC WITH DIFFERENTIAL/PLATELET
Abs Immature Granulocytes: 0.09 10*3/uL — ABNORMAL HIGH (ref 0.00–0.07)
Basophils Absolute: 0 10*3/uL (ref 0.0–0.1)
Basophils Relative: 0 %
Eosinophils Absolute: 0 10*3/uL (ref 0.0–0.5)
Eosinophils Relative: 0 %
HCT: 36.5 % (ref 36.0–46.0)
Hemoglobin: 11.5 g/dL — ABNORMAL LOW (ref 12.0–15.0)
Immature Granulocytes: 2 %
Lymphocytes Relative: 9 %
Lymphs Abs: 0.5 10*3/uL — ABNORMAL LOW (ref 0.7–4.0)
MCH: 29.6 pg (ref 26.0–34.0)
MCHC: 31.5 g/dL (ref 30.0–36.0)
MCV: 94.1 fL (ref 80.0–100.0)
Monocytes Absolute: 0.3 10*3/uL (ref 0.1–1.0)
Monocytes Relative: 5 %
Neutro Abs: 4.7 10*3/uL (ref 1.7–7.7)
Neutrophils Relative %: 84 %
Platelets: 73 10*3/uL — ABNORMAL LOW (ref 150–400)
RBC: 3.88 MIL/uL (ref 3.87–5.11)
RDW: 14.3 % (ref 11.5–15.5)
WBC: 5.6 10*3/uL (ref 4.0–10.5)
nRBC: 0 % (ref 0.0–0.2)

## 2018-09-17 LAB — BLOOD GAS, VENOUS
Acid-base deficit: 2.7 mmol/L — ABNORMAL HIGH (ref 0.0–2.0)
Bicarbonate: 22.3 mmol/L (ref 20.0–28.0)
FIO2: 21
O2 Saturation: 96.8 %
pCO2, Ven: 36.5 mmHg — ABNORMAL LOW (ref 44.0–60.0)
pH, Ven: 7.388 (ref 7.250–7.430)
pO2, Ven: 89.1 mmHg — ABNORMAL HIGH (ref 32.0–45.0)

## 2018-09-17 LAB — COMPREHENSIVE METABOLIC PANEL
ALT: 40 U/L (ref 0–44)
AST: 31 U/L (ref 15–41)
Albumin: 3.4 g/dL — ABNORMAL LOW (ref 3.5–5.0)
Alkaline Phosphatase: 74 U/L (ref 38–126)
Anion gap: 12 (ref 5–15)
BUN: 15 mg/dL (ref 6–20)
CO2: 20 mmol/L — ABNORMAL LOW (ref 22–32)
Calcium: 8.8 mg/dL — ABNORMAL LOW (ref 8.9–10.3)
Chloride: 102 mmol/L (ref 98–111)
Creatinine, Ser: 0.69 mg/dL (ref 0.44–1.00)
GFR calc Af Amer: >60 mL/min (ref >60)
GFR calc non Af Amer: >60 mL/min (ref >60)
Glucose, Bld: 537 mg/dL (ref 70–99)
Potassium: 4.4 mmol/L (ref 3.5–5.1)
Sodium: 134 mmol/L — ABNORMAL LOW (ref 135–145)
Total Bilirubin: 0.9 mg/dL (ref 0.3–1.2)
Total Protein: 6.9 g/dL (ref 6.5–8.1)

## 2018-09-17 LAB — HIV ANTIBODY (ROUTINE TESTING W REFLEX): HIV Screen 4th Generation wRfx: NONREACTIVE

## 2018-09-17 LAB — GLUCOSE, CAPILLARY
GLUCOSE-CAPILLARY: 298 mg/dL — AB (ref 70–99)
Glucose-Capillary: 253 mg/dL — ABNORMAL HIGH (ref 70–99)

## 2018-09-17 LAB — CBG MONITORING, ED
GLUCOSE-CAPILLARY: 503 mg/dL — AB (ref 70–99)
Glucose-Capillary: 376 mg/dL — ABNORMAL HIGH (ref 70–99)

## 2018-09-17 MED ORDER — CEFDINIR 300 MG PO CAPS: 300.0000 mg | ORAL_CAPSULE | Freq: Two times a day (BID) | ORAL | Status: DC

## 2018-09-17 MED ORDER — ZOLPIDEM TARTRATE 5 MG PO TABS
5.0000 mg | ORAL_TABLET | Freq: Every day | ORAL | Status: DC
  Administered 2018-09-18 – 2018-09-20 (×4): 5 mg via ORAL
  Filled 2018-09-17 (×4): qty 1

## 2018-09-17 MED ORDER — SODIUM CHLORIDE 0.9 % IV SOLN
INTRAVENOUS | Status: DC
  Administered 2018-09-18 – 2018-09-19 (×3): via INTRAVENOUS

## 2018-09-17 MED ORDER — POTASSIUM CHLORIDE CRYS ER 20 MEQ PO TBCR
40.0000 meq | EXTENDED_RELEASE_TABLET | Freq: Two times a day (BID) | ORAL | Status: DC
  Administered 2018-09-18 – 2018-09-21 (×7): 40 meq via ORAL
  Filled 2018-09-17 (×9): qty 2

## 2018-09-17 MED ORDER — PREDNISONE 20 MG PO TABS
40.0000 mg | ORAL_TABLET | Freq: Every day | ORAL | Status: DC
  Administered 2018-09-18 – 2018-09-21 (×4): 40 mg via ORAL
  Filled 2018-09-17 (×5): qty 2

## 2018-09-17 MED ORDER — ONDANSETRON HCL 4 MG/2ML IJ SOLN
4.0000 mg | Freq: Four times a day (QID) | INTRAMUSCULAR | Status: DC | PRN
  Administered 2018-09-19 – 2018-09-20 (×2): 4 mg via INTRAVENOUS
  Filled 2018-09-17 (×2): qty 2

## 2018-09-17 MED ORDER — ONDANSETRON HCL 4 MG PO TABS
4.0000 mg | ORAL_TABLET | Freq: Four times a day (QID) | ORAL | Status: DC | PRN
  Administered 2018-09-19 – 2018-09-20 (×2): 4 mg via ORAL
  Filled 2018-09-17 (×3): qty 1

## 2018-09-17 MED ORDER — DEXTROSE-NACL 5-0.45 % IV SOLN: INTRAVENOUS | Status: DC

## 2018-09-17 MED ORDER — ALBUTEROL SULFATE (2.5 MG/3ML) 0.083% IN NEBU
2.5000 mg | INHALATION_SOLUTION | Freq: Four times a day (QID) | RESPIRATORY_TRACT | Status: DC | PRN
  Administered 2018-09-18 – 2018-09-20 (×4): 2.5 mg via RESPIRATORY_TRACT
  Filled 2018-09-17 (×3): qty 3

## 2018-09-17 MED ORDER — BUTORPHANOL TARTRATE 10 MG/ML NA SOLN: 1.0000 | NASAL | Status: DC | PRN

## 2018-09-17 MED ORDER — METHADONE HCL 10 MG PO TABS
10.0000 mg | ORAL_TABLET | Freq: Once | ORAL | Status: AC
  Administered 2018-09-17: 10 mg via ORAL
  Filled 2018-09-17: qty 1

## 2018-09-17 MED ORDER — SODIUM CHLORIDE 0.9% FLUSH
3.0000 mL | Freq: Two times a day (BID) | INTRAVENOUS | Status: DC
  Administered 2018-09-17 – 2018-09-19 (×4): 3 mL via INTRAVENOUS

## 2018-09-17 MED ORDER — FAMOTIDINE 20 MG PO TABS
20.0000 mg | ORAL_TABLET | Freq: Every day | ORAL | Status: DC
  Administered 2018-09-18 – 2018-09-21 (×4): 20 mg via ORAL
  Filled 2018-09-17 (×4): qty 1

## 2018-09-17 MED ORDER — ONDANSETRON HCL 4 MG/2ML IJ SOLN
4.0000 mg | Freq: Once | INTRAMUSCULAR | Status: AC
  Administered 2018-09-17: 4 mg via INTRAVENOUS
  Filled 2018-09-17: qty 2

## 2018-09-17 MED ORDER — SODIUM CHLORIDE 0.9 % IV SOLN
INTRAVENOUS | Status: DC
  Administered 2018-09-17: 23:00:00 via INTRAVENOUS

## 2018-09-17 MED ORDER — DEXTROSE 50 % IV SOLN: 25.0000 mL | INTRAVENOUS | Status: DC | PRN

## 2018-09-17 MED ORDER — SODIUM CHLORIDE 0.9 % IV SOLN: 250.0000 mL | INTRAVENOUS | Status: DC | PRN

## 2018-09-17 MED ORDER — SPIRONOLACTONE 25 MG PO TABS
100.0000 mg | ORAL_TABLET | Freq: Every day | ORAL | Status: DC
  Administered 2018-09-18: 100 mg via ORAL
  Filled 2018-09-17: qty 1
  Filled 2018-09-17: qty 4
  Filled 2018-09-17: qty 1
  Filled 2018-09-17: qty 4

## 2018-09-17 MED ORDER — GUAIFENESIN ER 600 MG PO TB12
600.0000 mg | ORAL_TABLET | Freq: Two times a day (BID) | ORAL | Status: DC
  Administered 2018-09-18 (×3): 600 mg via ORAL
  Filled 2018-09-17 (×3): qty 1

## 2018-09-17 MED ORDER — CLONAZEPAM 0.5 MG PO TABS
1.0000 mg | ORAL_TABLET | Freq: Four times a day (QID) | ORAL | Status: DC
  Administered 2018-09-18 – 2018-09-19 (×8): 1 mg via ORAL
  Filled 2018-09-17 (×9): qty 2

## 2018-09-17 MED ORDER — METHADONE HCL 10 MG PO TABS
10.0000 mg | ORAL_TABLET | Freq: Four times a day (QID) | ORAL | Status: DC
  Administered 2018-09-18 – 2018-09-21 (×12): 10 mg via ORAL
  Filled 2018-09-17 (×12): qty 1

## 2018-09-17 MED ORDER — PREDNISONE 10 MG (21) PO TBPK: ORAL_TABLET | ORAL | 0 refills | Status: DC

## 2018-09-17 MED ORDER — ACETAMINOPHEN 325 MG PO TABS
650.0000 mg | ORAL_TABLET | Freq: Four times a day (QID) | ORAL | Status: DC | PRN
  Administered 2018-09-19 (×2): 650 mg via ORAL
  Filled 2018-09-17 (×2): qty 2

## 2018-09-17 MED ORDER — INSULIN REGULAR BOLUS VIA INFUSION
0.0000 [IU] | Freq: Three times a day (TID) | INTRAVENOUS | Status: DC
  Filled 2018-09-17: qty 10

## 2018-09-17 MED ORDER — ENOXAPARIN SODIUM 40 MG/0.4ML ~~LOC~~ SOLN
40.0000 mg | SUBCUTANEOUS | Status: DC
  Administered 2018-09-18 – 2018-09-21 (×4): 40 mg via SUBCUTANEOUS
  Filled 2018-09-17 (×4): qty 0.4

## 2018-09-17 MED ORDER — SODIUM CHLORIDE 0.9% FLUSH: 3.0000 mL | INTRAVENOUS | Status: DC | PRN

## 2018-09-17 MED ORDER — LACTULOSE 10 GM/15ML PO SOLN
10.0000 g | Freq: Three times a day (TID) | ORAL | Status: DC
  Administered 2018-09-18 – 2018-09-19 (×4): 10 g via ORAL
  Filled 2018-09-17 (×10): qty 30

## 2018-09-17 MED ORDER — VORTIOXETINE HBR 20 MG PO TABS
20.0000 mg | ORAL_TABLET | Freq: Every evening | ORAL | Status: DC
  Administered 2018-09-18 – 2018-09-20 (×3): 20 mg via ORAL
  Filled 2018-09-17 (×5): qty 1

## 2018-09-17 MED ORDER — PANTOPRAZOLE SODIUM 40 MG PO TBEC
40.0000 mg | DELAYED_RELEASE_TABLET | Freq: Every day | ORAL | Status: DC
  Administered 2018-09-18 – 2018-09-21 (×4): 40 mg via ORAL
  Filled 2018-09-17 (×4): qty 1

## 2018-09-17 MED ORDER — SUCRALFATE 1 G PO TABS: 1.0000 g | ORAL_TABLET | Freq: Four times a day (QID) | ORAL | Status: DC | PRN

## 2018-09-17 MED ORDER — TORSEMIDE 20 MG PO TABS
40.0000 mg | ORAL_TABLET | Freq: Every day | ORAL | Status: DC
  Administered 2018-09-18: 40 mg via ORAL
  Filled 2018-09-17 (×4): qty 2

## 2018-09-17 MED ORDER — INSULIN REGULAR(HUMAN) IN NACL 100-0.9 UT/100ML-% IV SOLN
INTRAVENOUS | Status: DC
  Administered 2018-09-17: 3.2 [IU]/h via INTRAVENOUS

## 2018-09-17 MED ORDER — CEFDINIR 300 MG PO CAPS: 300.0000 mg | ORAL_CAPSULE | Freq: Two times a day (BID) | ORAL | 0 refills | Status: DC

## 2018-09-17 MED ORDER — LORATADINE 10 MG PO TABS
10.0000 mg | ORAL_TABLET | Freq: Every day | ORAL | Status: DC
  Administered 2018-09-18 – 2018-09-21 (×4): 10 mg via ORAL
  Filled 2018-09-17 (×4): qty 1

## 2018-09-17 MED ORDER — CEFDINIR 300 MG PO CAPS
300.0000 mg | ORAL_CAPSULE | Freq: Two times a day (BID) | ORAL | Status: DC
  Administered 2018-09-18 – 2018-09-21 (×8): 300 mg via ORAL
  Filled 2018-09-17 (×8): qty 1

## 2018-09-17 MED ORDER — INSULIN REGULAR(HUMAN) IN NACL 100-0.9 UT/100ML-% IV SOLN
INTRAVENOUS | Status: DC
  Filled 2018-09-17: qty 100

## 2018-09-17 MED ORDER — ACETAMINOPHEN 650 MG RE SUPP: 650.0000 mg | Freq: Four times a day (QID) | RECTAL | Status: DC | PRN

## 2018-09-17 NOTE — ED Provider Notes (Signed)
[facility] EMERGENCY DEPARTMENT Provider Note   CSN: [id number] Arrival date & time: 09/17/18  1919     History   Chief Complaint Chief Complaint  Patient presents with  . Hyperglycemia    "liver pain"    HPI Jaime [name] is a 32 y.o. female.  HPI Patient presents with abdominal pain.  States right upper quadrant.  Has chronic pain there but thinks that it could be due to all the coughing she has been doing.  She was discharged from the hospital earlier today for asthma exacerbation.  Sugars also 500 now.  She is on steroids.  She is on metformin and 70/30 insulin at home.  Home health was supposed to deliver oxygen today but states she never got oxygen for home.  Does have history of cirrhosis.  Has a chronic abdominal pain.  Past Medical History:  Diagnosis Date  . Anxiety   . Chronic abdominal pain   . Chronic back pain   . Cirrhosis ([place])   . Depression   . Diabetes mellitus without complication ([place])   . Hepatitis C   . Insomnia   . Long-term current use of methadone for opiate dependence ([place])   . Migraine headache   . Peptic ulcer     Patient Active Problem List   Diagnosis Date Noted  . Hyperglycemia 09/17/2018  . Acute respiratory failure with hypoxia ([place]) 09/15/2018  . Anxiety 09/15/2018  . Chronic abdominal pain 09/15/2018  . Diabetes mellitus without complication ([place]) 66/04/3015  . Hepatitis C 09/15/2018  . Peptic ulcer 09/15/2018  . Long-term current use of methadone for opiate dependence ([place]) 09/15/2018  . Cirrhosis ([place]) 09/15/2018  . CAP (community acquired pneumonia) 07/17/2016  . Acute hypoxemic respiratory failure ([place]) 07/17/2016  . Pleural effusion on right 07/17/2016  . Multiple rib fractures involving four or more ribs 07/17/2016  . Hemothorax on right 07/17/2016  . Chronic pain disorder 07/17/2016    Past Surgical History:  Procedure Laterality Date  . CHOLECYSTECTOMY       OB History    Gravida      Para      Term      Preterm      AB      Living  0     SAB      TAB      Ectopic      Multiple      Live Births               Home Medications    Prior to Admission medications   Medication Sig Start Date End Date Taking? Authorizing Provider  albuterol (PROVENTIL) (2.5 MG/3ML) 0.083% nebulizer solution Take 2.5 mg by nebulization every 6 (six) hours as needed for wheezing or shortness of breath.   Yes [provider]  butorphanol (STADOL) 10 MG/ML nasal spray Place 1 spray into the nose every 4 (four) hours as needed (breakthrough pain).   Yes [provider]  cefdinir (OMNICEF) 300 MG capsule Take 1 capsule (300 mg total) by mouth every 12 (twelve) hours. 09/17/18  Yes Sinda Du, MD  cetirizine (ZYRTEC) 10 MG tablet Take 10 mg by mouth every evening.   Yes [provider]  clonazePAM (KLONOPIN) 1 MG tablet Take 1 mg by mouth 4 (four) times daily.    Yes [provider]  diclofenac sodium (VOLTAREN) 1 % GEL Apply 2 g topically 4 (four) times daily as needed (pain).  08/13/18  Yes [provider]  etonogestrel (NEXPLANON) 68 MG IMPL implant 1 each by Subdermal route once.   Yes [provider]  insulin NPH-regular Human (70-30) 100 UNIT/ML injection Inject 20 Units into the skin 2 (two) times daily with a meal. 08/24/18  Yes [provider]  lactulose (CHRONULAC) 10 GM/15ML solution Take 15 mLs by mouth 3 (three) times daily. 08/24/18  Yes [provider]  metFORMIN (GLUCOPHAGE-XR) 500 MG 24 hr tablet Take 1,000 mg by mouth 2 (two) times daily.   Yes [provider]  methadone (DOLOPHINE) 10 MG tablet Take 2 tablets (20 mg total) by mouth every 12 (twelve) hours. Patient taking differently: Take 10 mg by mouth 5 (five) times daily.  07/20/16  Yes Sinda Du, MD  pantoprazole (PROTONIX) 40 MG tablet Take 40 mg by mouth daily.   Yes [provider]  potassium chloride SA (K-DUR,KLOR-CON) 20 MEQ  tablet Take 2 tablets by mouth 2 (two) times daily. 07/28/18  Yes [provider]  predniSONE (STERAPRED UNI-PAK 21 TAB) 10 MG (21) TBPK tablet Take by package instructions 09/17/18  Yes Sinda Du, MD  prochlorperazine (COMPAZINE) 5 MG tablet Take 1 tablet by mouth every 6 (six) hours as needed for nausea.  07/11/18  Yes [provider]  promethazine (PHENERGAN) 25 MG tablet Take 25 mg by mouth daily as needed for nausea or vomiting.   Yes [provider]  ranitidine (ZANTAC) 150 MG tablet Take 150 mg by mouth at bedtime.   Yes [provider]  spironolactone (ALDACTONE) 50 MG tablet Take 2 tablets by mouth daily. 09/09/18  Yes [provider]  sucralfate (CARAFATE) 1 g tablet Take 1 g by mouth 4 (four) times daily as needed (ulcers).    Yes [provider]  Suvorexant (BELSOMRA) 20 MG TABS Take 20 mg by mouth at bedtime.    Yes [provider]  tetrahydrozoline 0.05 % ophthalmic solution Place 2 drops into both eyes 2 (two) times daily as needed (allergies/dry/itchy).   Yes [provider]  torsemide (DEMADEX) 20 MG tablet Take 2 tablets by mouth daily.   Yes [provider]  vortioxetine HBr (TRINTELLIX) 20 MG TABS tablet Take 20 mg by mouth every evening.    Yes [provider]  zolpidem (AMBIEN) 10 MG tablet Take 1 tablet by mouth at bedtime.  03/14/18  Yes [provider]    Family History Family History  Problem Relation Age of Onset  . Hypertension Mother   . Hyperlipidemia Other     Social History Social History   Tobacco Use  . Smoking status: Former Smoker    Packs/day: 0.00    Years: 5.00    Pack years: 0.00  . Smokeless tobacco: Never Used  Substance Use Topics  . Alcohol use: No  . Drug use: No     Allergies   Iodine-131; Ketorolac; Ketorolac tromethamine; Ivp dye [iodinated diagnostic agents]; Nsaids; Toradol [ketorolac tromethamine]; and Vancomycin   Review of  Systems Review of Systems  Constitutional: Negative for appetite change.  HENT: Negative for dental problem.   Respiratory: Positive for shortness of breath.   Cardiovascular: Negative for chest pain.  Gastrointestinal: Positive for abdominal pain. Negative for nausea and vomiting.  Genitourinary: Positive for frequency.  Musculoskeletal: Negative for back pain.  Skin: Negative for rash.  Neurological: Positive for weakness.  Psychiatric/Behavioral: Negative for confusion.     Physical Exam Updated Vital Signs BP 131/80   Pulse 96   Temp (!) 97 F (36.1 C) (  Oral)   Resp 14   Ht 5' 10"  (1.778 m)   Wt 124.3 kg   SpO2 95%   BMI 39.31 kg/m   Physical Exam  Constitutional: She appears well-developed.  HENT:  Head: Atraumatic.  Eyes: Pupils are equal, round, and reactive to light.  Neck: Neck supple.  Cardiovascular:  Tachycardia  Pulmonary/Chest:  Mildly harsh breath sounds without frank wheezes.  Abdominal: There is tenderness.  Mild right upper quadrant tenderness without rebound or guarding.  Musculoskeletal: She exhibits edema.  Mild bilateral lower extremity pitting edema.  Neurological: She is alert.  Skin: Skin is warm. Capillary refill takes less than 2 seconds.  Psychiatric: She has a normal mood and affect.     ED Treatments / Results  Labs (all labs ordered are listed, but only abnormal results are displayed) Labs Reviewed  COMPREHENSIVE METABOLIC PANEL - Abnormal; Notable for the following components:      Result Value   Sodium 134 (*)    CO2 20 (*)    Glucose, Bld 537 (*)    Calcium 8.8 (*)    Albumin 3.4 (*)    All other components within normal limits  BLOOD GAS, VENOUS - Abnormal; Notable for the following components:   pCO2, Ven 36.5 (*)    pO2, Ven 89.1 (*)    Acid-base deficit 2.7 (*)    All other components within normal limits  CBC WITH DIFFERENTIAL/PLATELET - Abnormal; Notable for the following components:   Hemoglobin 11.5 (*)     Platelets 73 (*)    Lymphs Abs 0.5 (*)    Abs Immature Granulocytes 0.09 (*)    All other components within normal limits  URINALYSIS, ROUTINE W REFLEX MICROSCOPIC - Abnormal; Notable for the following components:   Color, Urine STRAW (*)    Specific Gravity, Urine 1.032 (*)    Glucose, UA >=500 (*)    All other components within normal limits  CBG MONITORING, ED - Abnormal; Notable for the following components:   Glucose-Capillary 503 (*)    All other components within normal limits  CBG MONITORING, ED - Abnormal; Notable for the following components:   Glucose-Capillary 376 (*)    All other components within normal limits    EKG None  Radiology Dg Chest 2 View  Result Date: 09/17/2018 CLINICAL DATA:  Acute onset of hyperglycemia. EXAM: CHEST - 2 VIEW COMPARISON:  Chest radiograph performed 09/15/2018 FINDINGS: The lungs are well-aerated. Vascular congestion is noted. Increased interstitial markings raise concern for mild pulmonary edema. There is no evidence of pleural effusion or pneumothorax. The heart is normal in size; the mediastinal contour is within normal limits. No acute osseous abnormalities are seen. IMPRESSION: Vascular congestion. Increased interstitial markings raise concern for mild pulmonary edema. Electronically Signed   By: Garald Balding M.D.   On: 09/17/2018 21:13    Procedures Procedures (including critical care time)  Medications Ordered in ED Medications  0.9 %  sodium chloride infusion ( Intravenous New Bag/Given 09/17/18 2241)  dextrose 5 %-0.45 % sodium chloride infusion (has no administration in time range)  insulin regular, human (MYXREDLIN) 100 units/ 100 mL infusion (3.2 Units/hr Intravenous New Bag/Given 09/17/18 2243)  methadone (DOLOPHINE) tablet 10 mg (10 mg Oral Given 09/17/18 2047)  ondansetron (ZOFRAN) injection 4 mg (4 mg Intravenous Given 09/17/18 2047)     Initial Impression / Assessment and Plan / ED Course  I have reviewed the  triage vital signs and the nursing notes.  Pertinent labs & imaging  results that were available during my care of the patient were reviewed by me and considered in my medical decision making (see chart for details).    Patient presents with abdominal pain and hyperglycemia.  Sugar of 540 without DKA.  However is on steroids and is somewhat poorly controlled diabetic.  Also recent admission to the hospital was discharged earlier today.  Was supposed to have home oxygen for her desaturations but has not been set up at home.  Will discuss with hospitalist about further course of care.  We will start insulin drip.  Also has nighttime oxygen requirement does not have access to oxygen at home.  Admit to hospitalist  Final Clinical Impressions(s) / ED Diagnoses   Final diagnoses:  Hyperglycemia  Chronic abdominal pain  COPD exacerbation [facility])    ED Discharge Orders    None       Davonna Belling, MD 09/17/18 2328

## 2018-09-17 NOTE — ED Notes (Signed)
Date and time results received: 09/17/18 2013  Test: Glucose Critical Value: 537  Name of Provider Notified: Alvino Chapel  Orders Received? Or Actions Taken?: no new orders at this time

## 2018-09-17 NOTE — Discharge Summary (Signed)
Physician Discharge Summary  Patient ID: Jaime [name] MRN: [id number] DOB/AGE: Jan 12, 1986 32 y.o. Primary Care Physician:Iria Jamerson, Percell Miller, MD Admit date: 09/15/2018 Discharge date: 09/17/2018    Discharge Diagnoses:   Principal Problem:   Acute respiratory failure with hypoxia [facility]) Active Problems:   Anxiety   Chronic abdominal pain   Diabetes mellitus without complication (HCC)   Hepatitis C   Peptic ulcer   Long-term current use of methadone for opiate dependence ([place])   Cirrhosis ([place]) Asthma/COPD exacerbation  Allergies as of 09/17/2018      Reactions   Iodine-131 Shortness Of Breath   Ketorolac Shortness Of Breath   Ketorolac Tromethamine Shortness Of Breath   Ivp Dye [iodinated Diagnostic Agents] Other (See Comments)   Skin gets very red, unable to walk   Nsaids Other (See Comments)   Flares ulcers   Toradol [ketorolac Tromethamine]    sob   Vancomycin    Facial swelling,       Medication List    STOP taking these medications   amoxicillin 500 MG capsule Commonly known as:  AMOXIL   cephALEXin 500 MG capsule Commonly known as:  KEFLEX   cyclobenzaprine 10 MG tablet Commonly known as:  FLEXERIL     TAKE these medications   albuterol (2.5 MG/3ML) 0.083% nebulizer solution Commonly known as:  PROVENTIL Take 2.5 mg by nebulization every 6 (six) hours as needed for wheezing or shortness of breath.   BELSOMRA 20 MG Tabs Generic drug:  Suvorexant Take 20 mg by mouth at bedtime.   butorphanol 10 MG/ML nasal spray Commonly known as:  STADOL Place 1 spray into the nose every 4 (four) hours as needed (breakthrough pain).   cefdinir 300 MG capsule Commonly known as:  OMNICEF Take 1 capsule (300 mg total) by mouth every 12 (twelve) hours.   cetirizine 10 MG tablet Commonly known as:  ZYRTEC Take 10 mg by mouth every evening.   clonazePAM 1 MG tablet Commonly known as:  KLONOPIN Take 1 mg by mouth 4 (four) times daily.   diclofenac sodium 1 %  Gel Commonly known as:  VOLTAREN Apply 2 g topically 4 (four) times daily as needed.   insulin NPH-regular Human (70-30) 100 UNIT/ML injection Inject 20 Units into the skin 2 (two) times daily with a meal.   lactulose 10 GM/15ML solution Commonly known as:  CHRONULAC Take 15 mLs by mouth 3 (three) times daily.   metFORMIN 500 MG 24 hr tablet Commonly known as:  GLUCOPHAGE-XR Take 1,000 mg by mouth 2 (two) times daily.   methadone 10 MG tablet Commonly known as:  DOLOPHINE Take 2 tablets (20 mg total) by mouth every 12 (twelve) hours. What changed:    how much to take  when to take this   metolazone 2.5 MG tablet Commonly known as:  ZAROXOLYN Take 2 tablets by mouth daily.   NEXPLANON 68 MG Impl implant Generic drug:  etonogestrel 1 each by Subdermal route once.   pantoprazole 40 MG tablet Commonly known as:  PROTONIX Take 40 mg by mouth daily.   potassium chloride SA 20 MEQ tablet Commonly known as:  K-DUR,KLOR-CON Take 2 tablets by mouth 2 (two) times daily.   predniSONE 10 MG (21) Tbpk tablet Commonly known as:  STERAPRED UNI-PAK 21 TAB Take by package instructions   prochlorperazine 5 MG tablet Commonly known as:  COMPAZINE Take 1 tablet by mouth every 6 (six) hours as needed for nausea.   promethazine 25 MG tablet Commonly known as:  PHENERGAN Take 25 mg by mouth daily as needed for nausea or vomiting.   ranitidine 150 MG tablet Commonly known as:  ZANTAC Take 150 mg by mouth at bedtime.   spironolactone 50 MG tablet Commonly known as:  ALDACTONE Take 2 tablets by mouth daily.   sucralfate 1 g tablet Commonly known as:  CARAFATE Take 1 g by mouth 4 (four) times daily as needed (ulcers).   tetrahydrozoline 0.05 % ophthalmic solution Place 2 drops into both eyes 2 (two) times daily as needed (allergies/dry/itchy).   torsemide 20 MG tablet Commonly known as:  DEMADEX Take 2 tablets by mouth daily.   TRINTELLIX 20 MG Tabs tablet Generic drug:   vortioxetine HBr Take 20 mg by mouth every evening.   zolpidem 10 MG tablet Commonly known as:  AMBIEN Take 1 tablet by mouth at bedtime.            Durable Medical Equipment  (From admission, onward)         Start     Ordered   09/17/18 0937  For home use only DME oxygen  Once    Question Answer Comment  Mode or (Route) Nasal cannula   Liters per Minute 2   Frequency Only at night (stationary unit needed)   Oxygen conserving device No   Oxygen delivery system Gas      09/17/18 0937          Discharged Condition: Improved    Consults: None  Significant Diagnostic Studies: Dg Chest Port 1 View  Result Date: 09/15/2018 CLINICAL DATA:  Acute onset of nausea and vomiting. Cough, congestion and sore throat. EXAM: PORTABLE CHEST 1 VIEW COMPARISON:  Chest radiograph performed 07/23/2018 FINDINGS: The lungs are hypoexpanded. Vascular crowding and vascular congestion are seen. There is no evidence of focal opacification, pleural effusion or pneumothorax. The cardiomediastinal silhouette is mildly enlarged. No acute osseous abnormalities are seen. IMPRESSION: Lungs hypoexpanded. Vascular congestion and mild cardiomegaly noted. Lungs remain grossly clear. Electronically Signed   By: Garald Balding M.D.   On: 09/15/2018 06:29    Lab Results: Basic Metabolic Panel: Recent Labs    09/15/18 0621  NA 136  K 4.4  CL 101  CO2 26  GLUCOSE 144*  BUN 7  CREATININE 0.46  CALCIUM 8.7*   Liver Function Tests: No results for input(s): AST, ALT, ALKPHOS, BILITOT, PROT, ALBUMIN in the last 72 hours.   CBC: Recent Labs    09/15/18 0621 09/16/18 0448  WBC 4.4 2.9*  NEUTROABS 3.1  --   HGB 12.8 10.3*  HCT 41.0 32.6*  MCV 91.7 91.1  PLT 56* 51*    Recent Results (from the past 240 hour(s))  MRSA PCR Screening     Status: None   Collection Time: 09/15/18 11:15 AM  Result Value Ref Range Status   MRSA by PCR NEGATIVE NEGATIVE Final    Comment:        The GeneXpert  MRSA Assay (FDA approved for NASAL specimens only), is one component of a comprehensive MRSA colonization surveillance program. It is not intended to diagnose MRSA infection nor to guide or monitor treatment for MRSA infections. Performed at [facility], [address], [place], [place] [address]      Hospital Course: This is a 32 year old with asthma/COPD who had been in her usual state of health at home when she developed increasing shortness of breath cough congestion and wheezing.  She was found to be hypoxic in the emergency department and was  admitted.  She was treated with IV steroids inhaled bronchodilators and antibiotics and improved.  However she was still having trouble with hypoxia at night.  She is morbidly obese so there was some concern that she might have sleep apnea.  By the time of discharge her lungs were clear she was much better and back to her baseline breathing  Discharge Exam: Blood pressure 120/67, pulse 88, temperature 98.6 F (37 C), temperature source Oral, resp. rate 16, height 5' 10"  (1.778 m), weight 129.9 kg, SpO2 91 %. She is morbidly obese.  Her chest is clear.  Her heart is regular  Disposition: Home with home oxygen.  She will need a sleep study      Signed: Zamire Whitehurst L   09/17/2018, 10:33 AM

## 2018-09-17 NOTE — ED Triage Notes (Signed)
Pt was discharged from ICU this morning and sent home with [facility] to follow up and bring oxygen to house. Pt reports she has not heard from them. Pt reports blood glucose reading at home of "high". Pt was prescribed prednisone pack and anbx which pt reports taking. Pt does not have sliding scale insulin per report, only Novolin dose twice daily

## 2018-09-17 NOTE — ED Notes (Signed)
Patient transported to X-ray 

## 2018-09-17 NOTE — ED Notes (Signed)
Pt requesting to be placed on O2 at this time, RA sat is 96%. Placed pt on 0.5L [place] for comfort.

## 2018-09-17 NOTE — H&P (Signed)
TRH H&P    Patient Demographics:    Jaime [name], is a 32 y.o. female  MRN: [id number]  DOB - [birth year]  Admit Date - 09/17/2018  Referring MD/NP/PA: Dr. Alvino Chapel  Outpatient Primary MD for the patient is Sinda Du, MD  Patient coming from: Home  Chief complaint-high blood glucose   HPI:    Jaime [name]  is a 32 y.o. female, with history of asthma/COPD, type diabetes mellitus, anxiety, chronic pain syndrome on methadone who was just discharged from the hospital this morning after she was treated for COPD exacerbation.  Patient was discharged with home oxygen for using oxygen at night as patient was getting hypoxic at night in the hospital.  As per patient she never got oxygen today and when she checked her blood glucose it was unreadable on the glucometer as it was too high.  Patient came to hospital for high blood glucose.  In the ED she was found to have elevated blood glucose of 503.  She was started on IV insulin. She has chronic pain syndrome and has been taking chronic methadone 40 mg every day. 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 Denies fever or dysuria. Denies abdominal pain.    Review of systems:    In addition to the HPI above,    All other systems reviewed and are negative.   With Past History of the following :    Past Medical History:  Diagnosis Date  . Anxiety   . Chronic abdominal pain   . Chronic back pain   . Cirrhosis ([place])   . Depression   . Diabetes mellitus without complication ([place])   . Hepatitis C   . Insomnia   . Long-term current use of methadone for opiate dependence ([place])   . Migraine headache   . Peptic ulcer       Past Surgical History:  Procedure Laterality Date  . CHOLECYSTECTOMY        Social History:      Social History    Tobacco Use  . Smoking status: Former Smoker    Packs/day: 0.00    Years: 5.00    Pack years: 0.00  . Smokeless tobacco: Never Used  Substance Use Topics  . Alcohol use: No       Family History :     Family History  Problem Relation Age of Onset  . Hypertension Mother   . Hyperlipidemia Other       Home Medications:   Prior to Admission medications   Medication Sig Start Date End Date Taking? Authorizing Provider  albuterol (PROVENTIL) (2.5 MG/3ML) 0.083% nebulizer solution Take 2.5 mg by nebulization every 6 (six) hours as needed for wheezing or shortness of breath.   Yes [provider]  butorphanol (STADOL) 10 MG/ML nasal spray Place 1 spray into the nose every 4 (four) hours as needed (breakthrough pain).   Yes [provider]  cefdinir (OMNICEF) 300 MG capsule Take  1 capsule (300 mg total) by mouth every 12 (twelve) hours. 09/17/18  Yes Sinda Du, MD  cetirizine (ZYRTEC) 10 MG tablet Take 10 mg by mouth every evening.   Yes [provider]  clonazePAM (KLONOPIN) 1 MG tablet Take 1 mg by mouth 4 (four) times daily.    Yes [provider]  diclofenac sodium (VOLTAREN) 1 % GEL Apply 2 g topically 4 (four) times daily as needed (pain).  08/13/18  Yes [provider]  etonogestrel (NEXPLANON) 68 MG IMPL implant 1 each by Subdermal route once.   Yes [provider]  insulin NPH-regular Human (70-30) 100 UNIT/ML injection Inject 20 Units into the skin 2 (two) times daily with a meal. 08/24/18  Yes [provider]  lactulose (CHRONULAC) 10 GM/15ML solution Take 15 mLs by mouth 3 (three) times daily. 08/24/18  Yes [provider]  metFORMIN (GLUCOPHAGE-XR) 500 MG 24 hr tablet Take 1,000 mg by mouth 2 (two) times daily.   Yes [provider]  methadone (DOLOPHINE) 10 MG tablet Take 2 tablets (20 mg total) by mouth every 12 (twelve) hours. Patient taking differently: Take 10 mg by mouth 5 (five)  times daily.  07/20/16  Yes Sinda Du, MD  pantoprazole (PROTONIX) 40 MG tablet Take 40 mg by mouth daily.   Yes [provider]  potassium chloride SA (K-DUR,KLOR-CON) 20 MEQ tablet Take 2 tablets by mouth 2 (two) times daily. 07/28/18  Yes [provider]  predniSONE (STERAPRED UNI-PAK 21 TAB) 10 MG (21) TBPK tablet Take by package instructions 09/17/18  Yes Sinda Du, MD  prochlorperazine (COMPAZINE) 5 MG tablet Take 1 tablet by mouth every 6 (six) hours as needed for nausea.  07/11/18  Yes [provider]  promethazine (PHENERGAN) 25 MG tablet Take 25 mg by mouth daily as needed for nausea or vomiting.   Yes [provider]  ranitidine (ZANTAC) 150 MG tablet Take 150 mg by mouth at bedtime.   Yes [provider]  spironolactone (ALDACTONE) 50 MG tablet Take 2 tablets by mouth daily. 09/09/18  Yes [provider]  sucralfate (CARAFATE) 1 g tablet Take 1 g by mouth 4 (four) times daily as needed (ulcers).    Yes [provider]  Suvorexant (BELSOMRA) 20 MG TABS Take 20 mg by mouth at bedtime.    Yes [provider]  tetrahydrozoline 0.05 % ophthalmic solution Place 2 drops into both eyes 2 (two) times daily as needed (allergies/dry/itchy).   Yes [provider]  torsemide (DEMADEX) 20 MG tablet Take 2 tablets by mouth daily.   Yes [provider]  vortioxetine HBr (TRINTELLIX) 20 MG TABS tablet Take 20 mg by mouth every evening.    Yes [provider]  zolpidem (AMBIEN) 10 MG tablet Take 1 tablet by mouth at bedtime.  03/14/18  Yes [provider]     Allergies:     Allergies  Allergen Reactions  . Iodine-131 Shortness Of Breath  . Ketorolac Shortness Of Breath  . Ketorolac Tromethamine Shortness Of Breath  . Ivp Dye [Iodinated Diagnostic Agents] Other (See Comments)    Skin gets very red, unable to walk   . Nsaids Other (See Comments)    Flares ulcers  . Toradol [Ketorolac  Tromethamine]     sob  . Vancomycin     Facial swelling,      Physical Exam:   Vitals  Blood pressure 131/80, pulse 96, temperature (!) 97 F (36.1 C), temperature source Oral, resp.  rate 14, height 5' 10"  (1.778 m), weight 124.3 kg, SpO2 95 %.  1.  General: Appears in no acute distress  2. Psychiatric:  Intact judgement and  insight, awake alert, oriented x 3.  3. Neurologic: No focal neurological deficits, all cranial nerves intact.Strength 5/5 all 4 extremities, sensation intact all 4 extremities, plantars down going.  4. Eyes :  anicteric sclerae, moist conjunctivae with no lid lag. PERRLA.  5. ENMT:  Oropharynx clear with moist mucous membranes and good dentition  6. Neck:  supple, no cervical lymphadenopathy appriciated, No thyromegaly  7. Respiratory : Normal respiratory effort, scattered rhonchi bilaterally  8. Cardiovascular : RRR, no gallops, rubs or murmurs, no leg edema  9. Gastrointestinal:  Positive bowel sounds, abdomen soft, non-tender to palpation,no hepatosplenomegaly, no rigidity or guarding       10. Skin:  No cyanosis, normal texture and turgor, no rash, lesions or ulcers  11.Musculoskeletal:  Good muscle tone,  joints appear normal , no effusions,  normal range of motion    Data Review:    CBC Recent Labs  Lab 09/15/18 0621 09/16/18 0448 09/17/18 2002  WBC 4.4 2.9* 5.6  HGB 12.8 10.3* 11.5*  HCT 41.0 32.6* 36.5  PLT 56* 51* 73*  MCV 91.7 91.1 94.1  MCH 28.6 28.8 29.6  MCHC 31.2 31.6 31.5  RDW 14.1 14.1 14.3  LYMPHSABS 0.7  --  0.5*  MONOABS 0.4  --  0.3  EOSABS 0.1  --  0.0  BASOSABS 0.0  --  0.0   ------------------------------------------------------------------------------------------------------------------  Results for orders placed or performed during the hospital encounter of 09/17/18 (from the past 48 hour(s))  CBG monitoring, ED     Status: Abnormal   Collection Time: 09/17/18  7:27 PM  Result Value Ref Range    Glucose-Capillary 503 (HH) 70 - 99 mg/dL   Comment 1 Notify RN   Comprehensive metabolic panel     Status: Abnormal   Collection Time: 09/17/18  8:02 PM  Result Value Ref Range   Sodium 134 (L) 135 - 145 mmol/L   Potassium 4.4 3.5 - 5.1 mmol/L   Chloride 102 98 - 111 mmol/L   CO2 20 (L) 22 - 32 mmol/L   Glucose, Bld 537 (HH) 70 - 99 mg/dL    Comment: CRITICAL RESULT CALLED TO, READ BACK BY AND VERIFIED WITH: LASHLEY,S @ 2013 ON 09/17/18 BY JUW    BUN 15 6 - 20 mg/dL   Creatinine, Ser 0.69 0.44 - 1.00 mg/dL   Calcium 8.8 (L) 8.9 - 10.3 mg/dL   Total Protein 6.9 6.5 - 8.1 g/dL   Albumin 3.4 (L) 3.5 - 5.0 g/dL   AST 31 15 - 41 U/L   ALT 40 0 - 44 U/L   Alkaline Phosphatase 74 38 - 126 U/L   Total Bilirubin 0.9 0.3 - 1.2 mg/dL   GFR calc non Af Amer >60 >60 mL/min   GFR calc Af Amer >60 >60 mL/min    Comment: (NOTE) The eGFR has been calculated using the CKD EPI equation. This calculation has not been validated in all clinical situations. eGFR's persistently <60 mL/min signify possible Chronic Kidney Disease.    Anion gap 12 5 - 15    Comment: Performed at [facility], [address] [place], [place] [address]  Blood gas, venous     Status: Abnormal   Collection Time: 09/17/18  8:02 PM  Result Value Ref Range   FIO2 21.00    Delivery systems ROOM  AIR    pH, Ven 7.388 7.250 - 7.430   pCO2, Ven 36.5 (L) 44.0 - 60.0 mmHg   pO2, Ven 89.1 (H) 32.0 - 45.0 mmHg   Bicarbonate 22.3 20.0 - 28.0 mmol/L   Acid-base deficit 2.7 (H) 0.0 - 2.0 mmol/L   O2 Saturation 96.8 %   Collection site VEIN    Drawn by COLLECTED BY LABORATORY    Sample type VEIN     Comment: Performed at [facility], [address] [place], [place] [address]  CBC with Differential     Status: Abnormal   Collection Time: 09/17/18  8:02 PM  Result Value Ref Range   WBC 5.6 4.0 - 10.5 K/uL   RBC 3.88 3.87 - 5.11 MIL/uL   Hemoglobin 11.5 (L) 12.0 - 15.0 g/dL   HCT 36.5 36.0 - 46.0 %   MCV 94.1 80.0 - 100.0 fL    MCH 29.6 26.0 - 34.0 pg   MCHC 31.5 30.0 - 36.0 g/dL   RDW 14.3 11.5 - 15.5 %   Platelets 73 (L) 150 - 400 K/uL    Comment: SPECIMEN CHECKED FOR CLOTS   nRBC 0.0 0.0 - 0.2 %   Neutrophils Relative % 84 %   Neutro Abs 4.7 1.7 - 7.7 K/uL   Lymphocytes Relative 9 %   Lymphs Abs 0.5 (L) 0.7 - 4.0 K/uL   Monocytes Relative 5 %   Monocytes Absolute 0.3 0.1 - 1.0 K/uL   Eosinophils Relative 0 %   Eosinophils Absolute 0.0 0.0 - 0.5 K/uL   Basophils Relative 0 %   Basophils Absolute 0.0 0.0 - 0.1 K/uL   Immature Granulocytes 2 %   Abs Immature Granulocytes 0.09 (H) 0.00 - 0.07 K/uL    Comment: Performed at [facility], [address] [place], [place] [address]  Urinalysis, Routine w reflex microscopic     Status: Abnormal   Collection Time: 09/17/18  8:02 PM  Result Value Ref Range   Color, Urine STRAW (A) YELLOW   APPearance CLEAR CLEAR   Specific Gravity, Urine 1.032 (H) 1.005 - 1.030   pH 7.0 5.0 - 8.0   Glucose, UA >=500 (A) NEGATIVE mg/dL   Hgb urine dipstick NEGATIVE NEGATIVE   Bilirubin Urine NEGATIVE NEGATIVE   Ketones, ur NEGATIVE NEGATIVE mg/dL   Protein, ur NEGATIVE NEGATIVE mg/dL   Nitrite NEGATIVE NEGATIVE   Leukocytes, UA NEGATIVE NEGATIVE   RBC / HPF 0-5 0 - 5 RBC/hpf   WBC, UA 0-5 0 - 5 WBC/hpf   Bacteria, UA NONE SEEN NONE SEEN   Squamous Epithelial / LPF 0-5 0 - 5    Comment: Performed at [facility], [address], [place], [place] [address]  CBG monitoring, ED     Status: Abnormal   Collection Time: 09/17/18 10:38 PM  Result Value Ref Range   Glucose-Capillary 376 (H) 70 - 99 mg/dL    Chemistries  Recent Labs  Lab 09/15/18 0621 09/17/18 2002  NA 136 134*  K 4.4 4.4  CL 101 102  CO2 26 20*  GLUCOSE 144* 537*  BUN 7 15  CREATININE 0.46 0.69  CALCIUM 8.7* 8.8*  AST  --  31  ALT  --  40  ALKPHOS  --  74  BILITOT  --  0.9    ------------------------------------------------------------------------------------------------------------------  ------------------------------------------------------------------------------------------------------------------ GFR: Estimated Creatinine Clearance: 142 mL/min (by C-G formula based on SCr of 0.69 mg/dL). Liver Function Tests: Recent Labs  Lab 09/17/18 2002  AST 31  ALT 40  ALKPHOS 74  BILITOT 0.9  PROT 6.9  ALBUMIN 3.4*   CBG: Recent Labs  Lab 09/16/18 1621 09/16/18 2134 09/17/18 0827 09/17/18 1927 09/17/18 2238  GLUCAP 271* 319* 253* 503* 376*    --------------------------------------------------------------------------------------------------------------- Urine analysis:    Component Value Date/Time   COLORURINE STRAW (A) 09/17/2018 2002   APPEARANCEUR CLEAR 09/17/2018 2002   LABSPEC 1.032 (H) 09/17/2018 2002   PHURINE 7.0 09/17/2018 2002   GLUCOSEU >=500 (A) 09/17/2018 2002   HGBUR NEGATIVE 09/17/2018 2002   [place] NEGATIVE 09/17/2018 2002   [place] 09/17/2018 2002   PROTEINUR NEGATIVE 09/17/2018 2002   UROBILINOGEN 1.0 10/07/2013 0644   NITRITE NEGATIVE 09/17/2018 2002   LEUKOCYTESUR NEGATIVE 09/17/2018 2002      Imaging Results:    Dg Chest 2 View  Result Date: 09/17/2018 CLINICAL DATA:  Acute onset of hyperglycemia. EXAM: CHEST - 2 VIEW COMPARISON:  Chest radiograph performed 09/15/2018 FINDINGS: The lungs are well-aerated. Vascular congestion is noted. Increased interstitial markings raise concern for mild pulmonary edema. There is no evidence of pleural effusion or pneumothorax. The heart is normal in size; the mediastinal contour is within normal limits. No acute osseous abnormalities are seen. IMPRESSION: Vascular congestion. Increased interstitial markings raise concern for mild pulmonary edema. Electronically Signed   By: Garald Balding M.D.   On: 09/17/2018 21:13       Assessment & Plan:    Active Problems:    Hyperglycemia   1. Hyperglycemia-patient presented with elevated blood glucose, will start patient on glucose stabilizer, IV insulin.  We will switch to subcutaneous insulin once patient blood glucose less than 250.    2. Acute on chronic hypoxic respiratory failure -secondary to COPD exacerbation-patient was discharged on prednisone taper and Omnicef.  We will continue with these medications.  Will start prednisone 30 mg from tomorrow morning.  It can be tapered off once patient is discharged.  Patient is also on diuretics at home including spironolactone, Demadex.  Which we will continue in the hospital.  3. Diabetes mellitus-once patient is off IV insulin, she can be started back on insulin NPH 70/3020 units subcu twice daily and sliding scale insulin.  4. Chronic pain syndrome-continue methadone 10 mg p.o. every 6 hours    DVT Prophylaxis-   Lovenox   AM Labs Ordered, also please review Full Orders  Family Communication: Admission, patients condition and plan of care including tests being ordered have been discussed with the patient  who indicate understanding and agree with the plan and Code Status.  Code Status: Full code  Admission status: Observation  Time spent in minutes : 60 minutes   Oswald Hillock M.D on 09/17/2018 at 10:43 PM  Between 7am to 7pm - Pager - [phone]. After 7pm go to [email or web address] - password [facility]  [facility] - Office  [phone]

## 2018-09-17 NOTE — Progress Notes (Signed)
Subjective: She says her breathing is doing okay.  She wants to go home.  She does get hypoxia at night so she is going to need to have a sleep study to be sure she does not have apnea.  No other new complaints.  Objective: Vital signs in last 24 hours: Temp:  [97.6 F (36.4 C)-98.6 F (37 C)] 98.6 F (37 C) (11/16 0529) Pulse Rate:  [81-107] 81 (11/16 0500) Resp:  [11-30] 11 (11/16 0500) BP: (110-134)/(64-85) 123/79 (11/16 0500) SpO2:  [89 %-95 %] 94 % (11/16 0500) Weight change:  Last BM Date: 09/14/18  Intake/Output from previous day: 11/15 0701 - 11/16 0700 In: 103.1 [I.V.:3; IV Piggyback:100.1] Out: -   PHYSICAL EXAM General appearance: alert, cooperative, no distress and morbidly obese Resp: clear to auscultation bilaterally Cardio: regular rate and rhythm, S1, S2 normal, no murmur, click, rub or gallop GI: soft, non-tender; bowel sounds normal; no masses,  no organomegaly Extremities: extremities normal, atraumatic, no cyanosis or edema  Lab Results:  Results for orders placed or performed during the hospital encounter of 09/15/18 (from the past 48 hour(s))  MRSA PCR Screening     Status: None   Collection Time: 09/15/18 11:15 AM  Result Value Ref Range   MRSA by PCR NEGATIVE NEGATIVE    Comment:        The GeneXpert MRSA Assay (FDA approved for NASAL specimens only), is one component of a comprehensive MRSA colonization surveillance program. It is not intended to diagnose MRSA infection nor to guide or monitor treatment for MRSA infections. Performed at [facility], [address], [place], [place] [address]   Glucose, capillary     Status: Abnormal   Collection Time: 09/15/18 11:46 AM  Result Value Ref Range   Glucose-Capillary 274 (H) 70 - 99 mg/dL  Glucose, capillary     Status: Abnormal   Collection Time: 09/15/18  4:19 PM  Result Value Ref Range   Glucose-Capillary 351 (H) 70 - 99 mg/dL   Comment 1 Notify RN    Comment 2 Document in Chart    Glucose, capillary     Status: Abnormal   Collection Time: 09/15/18  9:12 PM  Result Value Ref Range   Glucose-Capillary 372 (H) 70 - 99 mg/dL   Comment 1 Notify RN    Comment 2 Document in Chart   HIV antibody (Routine Testing)     Status: None   Collection Time: 09/16/18  4:48 AM  Result Value Ref Range   HIV Screen 4th Generation wRfx Non Reactive Non Reactive    Comment: (NOTE) Performed At: [facility] [place], [place] [id number] Rush Farmer MD [id number]   CBC     Status: Abnormal   Collection Time: 09/16/18  4:48 AM  Result Value Ref Range   WBC 2.9 (L) 4.0 - 10.5 K/uL   RBC 3.58 (L) 3.87 - 5.11 MIL/uL   Hemoglobin 10.3 (L) 12.0 - 15.0 g/dL   HCT 32.6 (L) 36.0 - 46.0 %   MCV 91.1 80.0 - 100.0 fL   MCH 28.8 26.0 - 34.0 pg   MCHC 31.6 30.0 - 36.0 g/dL   RDW 14.1 11.5 - 15.5 %   Platelets 51 (L) 150 - 400 K/uL    Comment: REPEATED TO VERIFY SPECIMEN CHECKED FOR CLOTS Immature Platelet Fraction may be clinically indicated, consider ordering this additional test [id number] CONSISTENT WITH PREVIOUS RESULT    nRBC 0.0 0.0 - 0.2 %    Comment: Performed at  [place] [place], [place] [address]  Glucose, capillary     Status: Abnormal   Collection Time: 09/16/18  9:02 AM  Result Value Ref Range   Glucose-Capillary 331 (H) 70 - 99 mg/dL  Glucose, capillary     Status: Abnormal   Collection Time: 09/16/18 11:54 AM  Result Value Ref Range   Glucose-Capillary 371 (H) 70 - 99 mg/dL  Glucose, capillary     Status: Abnormal   Collection Time: 09/16/18  4:21 PM  Result Value Ref Range   Glucose-Capillary 271 (H) 70 - 99 mg/dL   Comment 1 Notify RN    Comment 2 Document in Chart   Glucose, capillary     Status: Abnormal   Collection Time: 09/16/18  9:34 PM  Result Value Ref Range   Glucose-Capillary 319 (H) 70 - 99 mg/dL  Glucose, capillary     Status: Abnormal   Collection Time: 09/17/18  8:27 AM  Result Value Ref Range    Glucose-Capillary 253 (H) 70 - 99 mg/dL    ABGS No results for input(s): PHART, PO2ART, TCO2, HCO3 in the last 72 hours.  Invalid input(s): PCO2 CULTURES Recent Results (from the past 240 hour(s))  MRSA PCR Screening     Status: None   Collection Time: 09/15/18 11:15 AM  Result Value Ref Range Status   MRSA by PCR NEGATIVE NEGATIVE Final    Comment:        The GeneXpert MRSA Assay (FDA approved for NASAL specimens only), is one component of a comprehensive MRSA colonization surveillance program. It is not intended to diagnose MRSA infection nor to guide or monitor treatment for MRSA infections. Performed at [facility], [address] [place], [place] [address]    Studies/Results: No results found.  Medications:  Prior to Admission:  Medications Prior to Admission  Medication Sig Dispense Refill Last Dose  . albuterol (PROVENTIL) (2.5 MG/3ML) 0.083% nebulizer solution Take 2.5 mg by nebulization every 6 (six) hours as needed for wheezing or shortness of breath.   Past Week at Unknown time  . amoxicillin (AMOXIL) 500 MG capsule Take 1 capsule by mouth 3 (three) times daily.   09/14/2018 at Unknown time  . butorphanol (STADOL) 10 MG/ML nasal spray Place 1 spray into the nose every 4 (four) hours as needed (breakthrough pain).   Past Week at Unknown time  . cetirizine (ZYRTEC) 10 MG tablet Take 10 mg by mouth every evening.   09/14/2018 at Unknown time  . clonazePAM (KLONOPIN) 1 MG tablet Take 1 mg by mouth 4 (four) times daily.    09/14/2018 at Unknown time  . diclofenac sodium (VOLTAREN) 1 % GEL Apply 2 g topically 4 (four) times daily as needed.  99 Past Week at Unknown time  . etonogestrel (NEXPLANON) 68 MG IMPL implant 1 each by Subdermal route once.   09/15/2018 at Unknown time  . insulin NPH-regular Human (70-30) 100 UNIT/ML injection Inject 20 Units into the skin 2 (two) times daily with a meal.   09/14/2018 at Unknown time  . lactulose (CHRONULAC) 10 GM/15ML solution  Take 15 mLs by mouth 3 (three) times daily.   09/14/2018 at Unknown time  . metFORMIN (GLUCOPHAGE-XR) 500 MG 24 hr tablet Take 1,000 mg by mouth 2 (two) times daily.   09/14/2018 at Unknown time  . methadone (DOLOPHINE) 10 MG tablet Take 2 tablets (20 mg total) by mouth every 12 (twelve) hours. (Patient taking differently: Take 10 mg by mouth 5 (five) times  daily. ) 120 tablet 0 09/14/2018 at Unknown time  . pantoprazole (PROTONIX) 40 MG tablet Take 40 mg by mouth daily.   09/14/2018 at Unknown time  . potassium chloride SA (K-DUR,KLOR-CON) 20 MEQ tablet Take 2 tablets by mouth 2 (two) times daily.   09/14/2018 at Unknown time  . prochlorperazine (COMPAZINE) 5 MG tablet Take 1 tablet by mouth every 6 (six) hours as needed for nausea.   0 Past Week at Unknown time  . promethazine (PHENERGAN) 25 MG tablet Take 25 mg by mouth daily as needed for nausea or vomiting.   Past Week at Unknown time  . ranitidine (ZANTAC) 150 MG tablet Take 150 mg by mouth at bedtime.   09/14/2018 at Unknown time  . spironolactone (ALDACTONE) 50 MG tablet Take 2 tablets by mouth daily.   09/14/2018 at Unknown time  . sucralfate (CARAFATE) 1 g tablet Take 1 g by mouth 4 (four) times daily as needed (ulcers).    07/18/2018 at Unknown time  . Suvorexant (BELSOMRA) 20 MG TABS Take 20 mg by mouth at bedtime.    09/14/2018 at Unknown time  . tetrahydrozoline 0.05 % ophthalmic solution Place 2 drops into both eyes 2 (two) times daily as needed (allergies/dry/itchy).   07/18/2018 at Unknown time  . torsemide (DEMADEX) 20 MG tablet Take 2 tablets by mouth daily.   09/14/2018 at Unknown time  . vortioxetine HBr (TRINTELLIX) 20 MG TABS tablet Take 20 mg by mouth every evening.    09/14/2018 at Unknown time  . zolpidem (AMBIEN) 10 MG tablet Take 1 tablet by mouth at bedtime.   5 09/14/2018 at Unknown time  . metolazone (ZAROXOLYN) 2.5 MG tablet Take 2 tablets by mouth daily.  0    Scheduled: . clonazePAM  1 mg Oral QID  . guaiFENesin   1,200 mg Oral BID  . insulin aspart  0-20 Units Subcutaneous TID WC  . insulin aspart  0-5 Units Subcutaneous QHS  . ipratropium-albuterol  3 mL Nebulization Q6H  . methadone  10 mg Oral 5 X Daily  . methylPREDNISolone (SOLU-MEDROL) injection  40 mg Intravenous Q12H  . pantoprazole  40 mg Oral Daily  . sodium chloride flush  3 mL Intravenous Q12H  . zolpidem  5 mg Oral QHS   Continuous: . cefTRIAXone (ROCEPHIN)  IV Stopped (09/16/18 2220)   RCV:ELFYBOFBP, prochlorperazine, promethazine  Assesment: She was admitted with acute hypoxic respiratory failure and asthma/COPD exacerbation.  She is better.  She still has nocturnal hypoxia and I think she is going to need oxygen at night and she will need a sleep study.  She is however ready for discharge today.  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  She will need nocturnal oxygen.    LOS: 1 day   Jakari Sada L 09/17/2018, 9:34 AM

## 2018-09-18 LAB — COMPREHENSIVE METABOLIC PANEL
ALBUMIN: 3.2 g/dL — AB (ref 3.5–5.0)
ALT: 35 U/L (ref 0–44)
AST: 21 U/L (ref 15–41)
Alkaline Phosphatase: 68 U/L (ref 38–126)
Anion gap: 7 (ref 5–15)
BUN: 15 mg/dL (ref 6–20)
CALCIUM: 8.5 mg/dL — AB (ref 8.9–10.3)
CO2: 28 mmol/L (ref 22–32)
Chloride: 100 mmol/L (ref 98–111)
Creatinine, Ser: 0.51 mg/dL (ref 0.44–1.00)
GFR calc Af Amer: >60 mL/min (ref >60)
GLUCOSE: 166 mg/dL — AB (ref 70–99)
Potassium: 3.8 mmol/L (ref 3.5–5.1)
Sodium: 135 mmol/L (ref 135–145)
TOTAL PROTEIN: 6.6 g/dL (ref 6.5–8.1)
Total Bilirubin: 0.5 mg/dL (ref 0.3–1.2)

## 2018-09-18 LAB — CBC
HEMATOCRIT: 34.7 % — AB (ref 36.0–46.0)
Hemoglobin: 10.7 g/dL — ABNORMAL LOW (ref 12.0–15.0)
MCH: 29 pg (ref 26.0–34.0)
MCHC: 30.8 g/dL (ref 30.0–36.0)
MCV: 94 fL (ref 80.0–100.0)
PLATELETS: 64 10*3/uL — AB (ref 150–400)
RBC: 3.69 MIL/uL — AB (ref 3.87–5.11)
RDW: 14.2 % (ref 11.5–15.5)
WBC: 4.6 10*3/uL (ref 4.0–10.5)
nRBC: 0 % (ref 0.0–0.2)

## 2018-09-18 LAB — GLUCOSE, CAPILLARY
GLUCOSE-CAPILLARY: 123 mg/dL — AB (ref 70–99)
GLUCOSE-CAPILLARY: 191 mg/dL — AB (ref 70–99)
GLUCOSE-CAPILLARY: 206 mg/dL — AB (ref 70–99)
GLUCOSE-CAPILLARY: 219 mg/dL — AB (ref 70–99)
GLUCOSE-CAPILLARY: 239 mg/dL — AB (ref 70–99)
Glucose-Capillary: 156 mg/dL — ABNORMAL HIGH (ref 70–99)
Glucose-Capillary: 349 mg/dL — ABNORMAL HIGH (ref 70–99)
Glucose-Capillary: 352 mg/dL — ABNORMAL HIGH (ref 70–99)

## 2018-09-18 MED ORDER — INSULIN ASPART 100 UNIT/ML ~~LOC~~ SOLN
0.0000 [IU] | Freq: Every day | SUBCUTANEOUS | Status: DC
  Administered 2018-09-18: 5 [IU] via SUBCUTANEOUS
  Administered 2018-09-20: 2 [IU] via SUBCUTANEOUS

## 2018-09-18 MED ORDER — INSULIN ASPART 100 UNIT/ML ~~LOC~~ SOLN
0.0000 [IU] | Freq: Three times a day (TID) | SUBCUTANEOUS | Status: DC
  Administered 2018-09-18: 20 [IU] via SUBCUTANEOUS
  Administered 2018-09-18: 7 [IU] via SUBCUTANEOUS
  Administered 2018-09-19: 15 [IU] via SUBCUTANEOUS
  Administered 2018-09-19: 4 [IU] via SUBCUTANEOUS
  Administered 2018-09-19: 11 [IU] via SUBCUTANEOUS
  Administered 2018-09-20: 3 [IU] via SUBCUTANEOUS
  Administered 2018-09-20: 11 [IU] via SUBCUTANEOUS
  Administered 2018-09-20: 15 [IU] via SUBCUTANEOUS

## 2018-09-18 MED ORDER — INSULIN GLARGINE 100 UNIT/ML ~~LOC~~ SOLN
15.0000 [IU] | Freq: Every day | SUBCUTANEOUS | Status: DC
  Administered 2018-09-18: 15 [IU] via SUBCUTANEOUS
  Filled 2018-09-18 (×2): qty 0.15

## 2018-09-18 NOTE — Progress Notes (Signed)
o2 at 2lpm cann applied after breathing treatment.

## 2018-09-18 NOTE — Progress Notes (Signed)
Subjective: She was in the hospital with asthma/COPD exacerbation and was discharged yesterday.  She was discharged on oxygen but was unable to get in touch with the DME company and never got the oxygen.  She said she felt more short of breath.  She had not taken her insulin or her medication orally for blood sugar and when she checked her blood sugar he was greater than 500 so she came back to the emergency department.  She had not started her prednisone.  She says she feels better.  She was on insulin drip and her blood sugar is now less than 200.  Objective: Vital signs in last 24 hours: Temp:  [97 F (36.1 C)-98.7 F (37.1 C)] 98.1 F (36.7 C) (11/17 0748) Pulse Rate:  [81-117] 81 (11/17 0400) Resp:  [13-21] 15 (11/17 0400) BP: (114-139)/(67-88) 118/79 (11/17 0400) SpO2:  [88 %-95 %] 95 % (11/17 0400) Weight:  [124.3 kg-129.6 kg] 129.6 kg (11/17 0500) Weight change:     Intake/Output from previous day: 11/16 0701 - 11/17 0700 In: 198.7 [I.V.:198.7] Out: -   PHYSICAL EXAM General appearance: alert, cooperative and morbidly obese Resp: rhonchi bilaterally Cardio: regular rate and rhythm, S1, S2 normal, no murmur, click, rub or gallop GI: soft, non-tender; bowel sounds normal; no masses,  no organomegaly Extremities: extremities normal, atraumatic, no cyanosis or edema  Lab Results:  Results for orders placed or performed during the hospital encounter of 09/17/18 (from the past 48 hour(s))  CBG monitoring, ED     Status: Abnormal   Collection Time: 09/17/18  7:27 PM  Result Value Ref Range   Glucose-Capillary 503 (HH) 70 - 99 mg/dL   Comment 1 Notify RN   Comprehensive metabolic panel     Status: Abnormal   Collection Time: 09/17/18  8:02 PM  Result Value Ref Range   Sodium 134 (L) 135 - 145 mmol/L   Potassium 4.4 3.5 - 5.1 mmol/L   Chloride 102 98 - 111 mmol/L   CO2 20 (L) 22 - 32 mmol/L   Glucose, Bld 537 (HH) 70 - 99 mg/dL    Comment: CRITICAL RESULT CALLED TO, READ  BACK BY AND VERIFIED WITH: LASHLEY,S @ 2013 ON 09/17/18 BY JUW    BUN 15 6 - 20 mg/dL   Creatinine, Ser 0.69 0.44 - 1.00 mg/dL   Calcium 8.8 (L) 8.9 - 10.3 mg/dL   Total Protein 6.9 6.5 - 8.1 g/dL   Albumin 3.4 (L) 3.5 - 5.0 g/dL   AST 31 15 - 41 U/L   ALT 40 0 - 44 U/L   Alkaline Phosphatase 74 38 - 126 U/L   Total Bilirubin 0.9 0.3 - 1.2 mg/dL   GFR calc non Af Amer >60 >60 mL/min   GFR calc Af Amer >60 >60 mL/min    Comment: (NOTE) The eGFR has been calculated using the CKD EPI equation. This calculation has not been validated in all clinical situations. eGFR's persistently <60 mL/min signify possible Chronic Kidney Disease.    Anion gap 12 5 - 15    Comment: Performed at [facility], [address], [place], [place] [address]  Blood gas, venous     Status: Abnormal   Collection Time: 09/17/18  8:02 PM  Result Value Ref Range   FIO2 21.00    Delivery systems ROOM AIR    pH, Ven 7.388 7.250 - 7.430   pCO2, Ven 36.5 (L) 44.0 - 60.0 mmHg   pO2, Ven 89.1 (H) 32.0 - 45.0 mmHg  Bicarbonate 22.3 20.0 - 28.0 mmol/L   Acid-base deficit 2.7 (H) 0.0 - 2.0 mmol/L   O2 Saturation 96.8 %   Collection site VEIN    Drawn by COLLECTED BY LABORATORY    Sample type VEIN     Comment: Performed at [facility], [address] [place], [place] [address]  CBC with Differential     Status: Abnormal   Collection Time: 09/17/18  8:02 PM  Result Value Ref Range   WBC 5.6 4.0 - 10.5 K/uL   RBC 3.88 3.87 - 5.11 MIL/uL   Hemoglobin 11.5 (L) 12.0 - 15.0 g/dL   HCT 36.5 36.0 - 46.0 %   MCV 94.1 80.0 - 100.0 fL   MCH 29.6 26.0 - 34.0 pg   MCHC 31.5 30.0 - 36.0 g/dL   RDW 14.3 11.5 - 15.5 %   Platelets 73 (L) 150 - 400 K/uL    Comment: SPECIMEN CHECKED FOR CLOTS   nRBC 0.0 0.0 - 0.2 %   Neutrophils Relative % 84 %   Neutro Abs 4.7 1.7 - 7.7 K/uL   Lymphocytes Relative 9 %   Lymphs Abs 0.5 (L) 0.7 - 4.0 K/uL   Monocytes Relative 5 %   Monocytes Absolute 0.3 0.1 - 1.0 K/uL   Eosinophils  Relative 0 %   Eosinophils Absolute 0.0 0.0 - 0.5 K/uL   Basophils Relative 0 %   Basophils Absolute 0.0 0.0 - 0.1 K/uL   Immature Granulocytes 2 %   Abs Immature Granulocytes 0.09 (H) 0.00 - 0.07 K/uL    Comment: Performed at [facility], [address] [place], [place] [address]  Urinalysis, Routine w reflex microscopic     Status: Abnormal   Collection Time: 09/17/18  8:02 PM  Result Value Ref Range   Color, Urine STRAW (A) YELLOW   APPearance CLEAR CLEAR   Specific Gravity, Urine 1.032 (H) 1.005 - 1.030   pH 7.0 5.0 - 8.0   Glucose, UA >=500 (A) NEGATIVE mg/dL   Hgb urine dipstick NEGATIVE NEGATIVE   Bilirubin Urine NEGATIVE NEGATIVE   Ketones, ur NEGATIVE NEGATIVE mg/dL   Protein, ur NEGATIVE NEGATIVE mg/dL   Nitrite NEGATIVE NEGATIVE   Leukocytes, UA NEGATIVE NEGATIVE   RBC / HPF 0-5 0 - 5 RBC/hpf   WBC, UA 0-5 0 - 5 WBC/hpf   Bacteria, UA NONE SEEN NONE SEEN   Squamous Epithelial / LPF 0-5 0 - 5    Comment: Performed at [facility], [address], [place],  [address]  CBG monitoring, ED     Status: Abnormal   Collection Time: 09/17/18 10:38 PM  Result Value Ref Range   Glucose-Capillary 376 (H) 70 - 99 mg/dL  Glucose, capillary     Status: Abnormal   Collection Time: 09/17/18 11:38 PM  Result Value Ref Range   Glucose-Capillary 298 (H) 70 - 99 mg/dL  Glucose, capillary     Status: Abnormal   Collection Time: 09/18/18 12:52 AM  Result Value Ref Range   Glucose-Capillary 239 (H) 70 - 99 mg/dL  Glucose, capillary     Status: Abnormal   Collection Time: 09/18/18  2:12 AM  Result Value Ref Range   Glucose-Capillary 206 (H) 70 - 99 mg/dL  Glucose, capillary     Status: Abnormal   Collection Time: 09/18/18  3:17 AM  Result Value Ref Range   Glucose-Capillary 191 (H) 70 - 99 mg/dL  CBC     Status: Abnormal   Collection Time: 09/18/18  4:33  AM  Result Value Ref Range   WBC 4.6 4.0 - 10.5 K/uL   RBC 3.69 (L) 3.87 - 5.11 MIL/uL   Hemoglobin 10.7 (L) 12.0 - 15.0  g/dL   HCT 34.7 (L) 36.0 - 46.0 %   MCV 94.0 80.0 - 100.0 fL   MCH 29.0 26.0 - 34.0 pg   MCHC 30.8 30.0 - 36.0 g/dL   RDW 14.2 11.5 - 15.5 %   Platelets 64 (L) 150 - 400 K/uL    Comment: SPECIMEN CHECKED FOR CLOTS Immature Platelet Fraction may be clinically indicated, consider ordering this additional test [id number] CONSISTENT WITH PREVIOUS RESULT    nRBC 0.0 0.0 - 0.2 %    Comment: Performed at [facility], [address], [place], [place] [address]  Comprehensive metabolic panel     Status: Abnormal   Collection Time: 09/18/18  4:33 AM  Result Value Ref Range   Sodium 135 135 - 145 mmol/L   Potassium 3.8 3.5 - 5.1 mmol/L   Chloride 100 98 - 111 mmol/L   CO2 28 22 - 32 mmol/L   Glucose, Bld 166 (H) 70 - 99 mg/dL   BUN 15 6 - 20 mg/dL   Creatinine, Ser 0.51 0.44 - 1.00 mg/dL   Calcium 8.5 (L) 8.9 - 10.3 mg/dL   Total Protein 6.6 6.5 - 8.1 g/dL   Albumin 3.2 (L) 3.5 - 5.0 g/dL   AST 21 15 - 41 U/L   ALT 35 0 - 44 U/L   Alkaline Phosphatase 68 38 - 126 U/L   Total Bilirubin 0.5 0.3 - 1.2 mg/dL   GFR calc non Af Amer >60 >60 mL/min   GFR calc Af Amer >60 >60 mL/min    Comment: (NOTE) The eGFR has been calculated using the CKD EPI equation. This calculation has not been validated in all clinical situations. eGFR's persistently <60 mL/min signify possible Chronic Kidney Disease.    Anion gap 7 5 - 15    Comment: Performed at [facility] [address] [place], [place] [address]  Glucose, capillary     Status: Abnormal   Collection Time: 09/18/18  5:32 AM  Result Value Ref Range   Glucose-Capillary 156 (H) 70 - 99 mg/dL  Glucose, capillary     Status: Abnormal   Collection Time: 09/18/18  7:46 AM  Result Value Ref Range   Glucose-Capillary 123 (H) 70 - 99 mg/dL    ABGS Recent Labs    09/17/18 2002  HCO3 22.3   CULTURES Recent Results (from the past 240 hour(s))  MRSA PCR Screening     Status: None   Collection Time: 09/15/18 11:15 AM  Result Value Ref Range  Status   MRSA by PCR NEGATIVE NEGATIVE Final    Comment:        The GeneXpert MRSA Assay (FDA approved for NASAL specimens only), is one component of a comprehensive MRSA colonization surveillance program. It is not intended to diagnose MRSA infection nor to guide or monitor treatment for MRSA infections. Performed at [facility], [address] [place], [place] [address]    Studies/Results: Dg Chest 2 View  Result Date: 09/17/2018 CLINICAL DATA:  Acute onset of hyperglycemia. EXAM: CHEST - 2 VIEW COMPARISON:  Chest radiograph performed 09/15/2018 FINDINGS: The lungs are well-aerated. Vascular congestion is noted. Increased interstitial markings raise concern for mild pulmonary edema. There is no evidence of pleural effusion or pneumothorax. The heart is normal in size; the mediastinal contour is within normal limits. No  acute osseous abnormalities are seen. IMPRESSION: Vascular congestion. Increased interstitial markings raise concern for mild pulmonary edema. Electronically Signed   By: Garald Balding M.D.   On: 09/17/2018 21:13    Medications:  Prior to Admission:  Medications Prior to Admission  Medication Sig Dispense Refill Last Dose  . albuterol (PROVENTIL) (2.5 MG/3ML) 0.083% nebulizer solution Take 2.5 mg by nebulization every 6 (six) hours as needed for wheezing or shortness of breath.   Past Week at Unknown time  . butorphanol (STADOL) 10 MG/ML nasal spray Place 1 spray into the nose every 4 (four) hours as needed (breakthrough pain).   Past Week at Unknown time  . cefdinir (OMNICEF) 300 MG capsule Take 1 capsule (300 mg total) by mouth every 12 (twelve) hours. 10 capsule 0 09/17/2018  . cetirizine (ZYRTEC) 10 MG tablet Take 10 mg by mouth every evening.   09/17/2018  . clonazePAM (KLONOPIN) 1 MG tablet Take 1 mg by mouth 4 (four) times daily.    09/17/2018  . diclofenac sodium (VOLTAREN) 1 % GEL Apply 2 g topically 4 (four) times daily as needed (pain).   99 unknown  .  etonogestrel (NEXPLANON) 68 MG IMPL implant 1 each by Subdermal route once.     . insulin NPH-regular Human (70-30) 100 UNIT/ML injection Inject 20 Units into the skin 2 (two) times daily with a meal.   09/17/2018  . lactulose (CHRONULAC) 10 GM/15ML solution Take 15 mLs by mouth 3 (three) times daily.   09/17/2018  . metFORMIN (GLUCOPHAGE-XR) 500 MG 24 hr tablet Take 1,000 mg by mouth 2 (two) times daily.   09/17/2018  . methadone (DOLOPHINE) 10 MG tablet Take 2 tablets (20 mg total) by mouth every 12 (twelve) hours. (Patient taking differently: Take 10 mg by mouth 5 (five) times daily. ) 120 tablet 0 09/17/2018  . pantoprazole (PROTONIX) 40 MG tablet Take 40 mg by mouth daily.   09/17/2018  . potassium chloride SA (K-DUR,KLOR-CON) 20 MEQ tablet Take 2 tablets by mouth 2 (two) times daily.   09/17/2018  . predniSONE (STERAPRED UNI-PAK 21 TAB) 10 MG (21) TBPK tablet Take by package instructions 21 tablet 0 09/17/2018  . prochlorperazine (COMPAZINE) 5 MG tablet Take 1 tablet by mouth every 6 (six) hours as needed for nausea.   0 09/17/2018  . promethazine (PHENERGAN) 25 MG tablet Take 25 mg by mouth daily as needed for nausea or vomiting.   09/17/2018  . ranitidine (ZANTAC) 150 MG tablet Take 150 mg by mouth at bedtime.   09/17/2018  . spironolactone (ALDACTONE) 50 MG tablet Take 2 tablets by mouth daily.   09/17/2018  . sucralfate (CARAFATE) 1 g tablet Take 1 g by mouth 4 (four) times daily as needed (ulcers).    09/17/2018  . Suvorexant (BELSOMRA) 20 MG TABS Take 20 mg by mouth at bedtime.    09/16/2018 at Unknown time  . tetrahydrozoline 0.05 % ophthalmic solution Place 2 drops into both eyes 2 (two) times daily as needed (allergies/dry/itchy).   Past Month at Unknown time  . torsemide (DEMADEX) 20 MG tablet Take 2 tablets by mouth daily.   09/17/2018  . vortioxetine HBr (TRINTELLIX) 20 MG TABS tablet Take 20 mg by mouth every evening.    09/16/2018 at Unknown time  . zolpidem (AMBIEN) 10 MG tablet  Take 1 tablet by mouth at bedtime.   5 09/16/2018 at Unknown time   Scheduled: . cefdinir  300 mg Oral Q12H  . clonazePAM  1 mg Oral  QID  . enoxaparin (LOVENOX) injection  40 mg Subcutaneous Q24H  . famotidine  20 mg Oral Daily  . guaiFENesin  600 mg Oral BID  . insulin aspart  0-20 Units Subcutaneous TID WC  . insulin aspart  0-5 Units Subcutaneous QHS  . insulin glargine  15 Units Subcutaneous Daily  . lactulose  10 g Oral TID  . loratadine  10 mg Oral Daily  . methadone  10 mg Oral Q6H  . pantoprazole  40 mg Oral Daily  . potassium chloride SA  40 mEq Oral BID  . predniSONE  40 mg Oral Q breakfast  . sodium chloride flush  3 mL Intravenous Q12H  . spironolactone  100 mg Oral Daily  . torsemide  40 mg Oral Daily  . vortioxetine HBr  20 mg Oral QPM  . zolpidem  5 mg Oral QHS   Continuous: . sodium chloride    . sodium chloride 50 mL/hr at 09/18/18 0003  . dextrose 5 % and 0.45% NaCl Stopped (09/17/18 2345)   LYY:TKPTWS chloride, acetaminophen **OR** acetaminophen, albuterol, butorphanol, dextrose, ondansetron **OR** ondansetron (ZOFRAN) IV, sodium chloride flush, sucralfate  Assesment: She was admitted with hyperglycemia.  This is probably multifactorial including the fact that she was in the hospital with COPD exacerbation and received steroids has diabetes at baseline did not take her medication at home.  She is much better.  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   She has severe anxiety and depression which is an ongoing  issue Active Problems:   Hyperglycemia    Plan: Continue current treatments.  Start sliding scale and Lantus    LOS: 0 days   Darnel Mchan L 09/18/2018, 9:45 AM

## 2018-09-19 ENCOUNTER — Inpatient Hospital Stay (HOSPITAL_COMMUNITY): Payer: Medicaid Other

## 2018-09-19 DIAGNOSIS — E1165 Type 2 diabetes mellitus with hyperglycemia: Secondary | ICD-10-CM | POA: Diagnosis present

## 2018-09-19 DIAGNOSIS — IMO0002 Reserved for concepts with insufficient information to code with codable children: Secondary | ICD-10-CM | POA: Diagnosis present

## 2018-09-19 DIAGNOSIS — Z8711 Personal history of peptic ulcer disease: Secondary | ICD-10-CM | POA: Diagnosis not present

## 2018-09-19 DIAGNOSIS — B182 Chronic viral hepatitis C: Secondary | ICD-10-CM | POA: Diagnosis present

## 2018-09-19 DIAGNOSIS — G43909 Migraine, unspecified, not intractable, without status migrainosus: Secondary | ICD-10-CM | POA: Diagnosis present

## 2018-09-19 DIAGNOSIS — Z8249 Family history of ischemic heart disease and other diseases of the circulatory system: Secondary | ICD-10-CM | POA: Diagnosis not present

## 2018-09-19 DIAGNOSIS — J441 Chronic obstructive pulmonary disease with (acute) exacerbation: Secondary | ICD-10-CM | POA: Diagnosis present

## 2018-09-19 DIAGNOSIS — R0602 Shortness of breath: Secondary | ICD-10-CM

## 2018-09-19 DIAGNOSIS — F319 Bipolar disorder, unspecified: Secondary | ICD-10-CM | POA: Diagnosis present

## 2018-09-19 DIAGNOSIS — Z6841 Body Mass Index (BMI) 40.0 and over, adult: Secondary | ICD-10-CM | POA: Diagnosis not present

## 2018-09-19 DIAGNOSIS — Z9981 Dependence on supplemental oxygen: Secondary | ICD-10-CM | POA: Diagnosis not present

## 2018-09-19 DIAGNOSIS — R739 Hyperglycemia, unspecified: Secondary | ICD-10-CM | POA: Diagnosis present

## 2018-09-19 DIAGNOSIS — F1120 Opioid dependence, uncomplicated: Secondary | ICD-10-CM | POA: Diagnosis present

## 2018-09-19 DIAGNOSIS — Z794 Long term (current) use of insulin: Secondary | ICD-10-CM | POA: Diagnosis not present

## 2018-09-19 DIAGNOSIS — K7460 Unspecified cirrhosis of liver: Secondary | ICD-10-CM | POA: Diagnosis present

## 2018-09-19 DIAGNOSIS — G894 Chronic pain syndrome: Secondary | ICD-10-CM | POA: Diagnosis present

## 2018-09-19 DIAGNOSIS — Z87891 Personal history of nicotine dependence: Secondary | ICD-10-CM | POA: Diagnosis not present

## 2018-09-19 DIAGNOSIS — J45901 Unspecified asthma with (acute) exacerbation: Secondary | ICD-10-CM | POA: Diagnosis present

## 2018-09-19 DIAGNOSIS — Z79891 Long term (current) use of opiate analgesic: Secondary | ICD-10-CM | POA: Diagnosis not present

## 2018-09-19 DIAGNOSIS — J9621 Acute and chronic respiratory failure with hypoxia: Secondary | ICD-10-CM | POA: Diagnosis present

## 2018-09-19 LAB — ECHOCARDIOGRAM COMPLETE
HEIGHTINCHES: 70 in
Weight: 4539.71 oz

## 2018-09-19 LAB — GLUCOSE, CAPILLARY
GLUCOSE-CAPILLARY: 257 mg/dL — AB (ref 70–99)
Glucose-Capillary: 196 mg/dL — ABNORMAL HIGH (ref 70–99)
Glucose-Capillary: 306 mg/dL — ABNORMAL HIGH (ref 70–99)
Glucose-Capillary: 193 mg/dL — ABNORMAL HIGH (ref 70–99)

## 2018-09-19 MED ORDER — INSULIN ASPART 100 UNIT/ML ~~LOC~~ SOLN
3.0000 [IU] | Freq: Three times a day (TID) | SUBCUTANEOUS | Status: DC
  Administered 2018-09-19 (×2): 3 [IU] via SUBCUTANEOUS

## 2018-09-19 MED ORDER — GUAIFENESIN ER 600 MG PO TB12
1200.0000 mg | ORAL_TABLET | Freq: Two times a day (BID) | ORAL | Status: DC
  Administered 2018-09-19 – 2018-09-21 (×5): 1200 mg via ORAL
  Filled 2018-09-19 (×5): qty 2

## 2018-09-19 MED ORDER — INSULIN GLARGINE 100 UNIT/ML ~~LOC~~ SOLN
25.0000 [IU] | Freq: Every day | SUBCUTANEOUS | Status: DC
  Administered 2018-09-19 – 2018-09-21 (×3): 25 [IU] via SUBCUTANEOUS
  Filled 2018-09-19 (×4): qty 0.25

## 2018-09-19 MED ORDER — LORAZEPAM 2 MG/ML IJ SOLN
0.5000 mg | Freq: Once | INTRAMUSCULAR | Status: AC
  Administered 2018-09-19: 0.5 mg via INTRAVENOUS
  Filled 2018-09-19: qty 1

## 2018-09-19 NOTE — Progress Notes (Signed)
Subjective: She says she feels okay.  She was admitted with hyperglycemia and continued problems with COPD exacerbation.  She is been hypoxic mostly at night and will need a sleep study as an outpatient.  Blood sugar is still in the 300s but she is still on dextrose IV.  Objective: Vital signs in last 24 hours: Temp:  [97.8 F (36.6 C)-98.7 F (37.1 C)] 98.2 F (36.8 C) (11/18 0400) Pulse Rate:  [73-103] 81 (11/18 0700) Resp:  [10-27] 12 (11/18 0700) BP: (101-132)/(48-82) 118/68 (11/18 0700) SpO2:  [87 %-97 %] 94 % (11/18 0700) Weight:  [128.7 kg] 128.7 kg (11/18 0500) Weight change: 4.414 kg    Intake/Output from previous day: 11/17 0701 - 11/18 0700 In: 203 [I.V.:203] Out: -   PHYSICAL EXAM General appearance: alert, cooperative, no distress and morbidly obese Resp: rhonchi bilaterally Cardio: regular rate and rhythm, S1, S2 normal, no murmur, click, rub or gallop GI: soft, non-tender; bowel sounds normal; no masses,  no organomegaly Extremities: extremities normal, atraumatic, no cyanosis or edema  Lab Results:  Results for orders placed or performed during the hospital encounter of 09/17/18 (from the past 48 hour(s))  CBG monitoring, ED     Status: Abnormal   Collection Time: 09/17/18  7:27 PM  Result Value Ref Range   Glucose-Capillary 503 (HH) 70 - 99 mg/dL   Comment 1 Notify RN   Comprehensive metabolic panel     Status: Abnormal   Collection Time: 09/17/18  8:02 PM  Result Value Ref Range   Sodium 134 (L) 135 - 145 mmol/L   Potassium 4.4 3.5 - 5.1 mmol/L   Chloride 102 98 - 111 mmol/L   CO2 20 (L) 22 - 32 mmol/L   Glucose, Bld 537 (HH) 70 - 99 mg/dL    Comment: CRITICAL RESULT CALLED TO, READ BACK BY AND VERIFIED WITH: LASHLEY,S @ 2013 ON 09/17/18 BY JUW    BUN 15 6 - 20 mg/dL   Creatinine, Ser 0.69 0.44 - 1.00 mg/dL   Calcium 8.8 (L) 8.9 - 10.3 mg/dL   Total Protein 6.9 6.5 - 8.1 g/dL   Albumin 3.4 (L) 3.5 - 5.0 g/dL   AST 31 15 - 41 U/L   ALT 40 0 - 44  U/L   Alkaline Phosphatase 74 38 - 126 U/L   Total Bilirubin 0.9 0.3 - 1.2 mg/dL   GFR calc non Af Amer >60 >60 mL/min   GFR calc Af Amer >60 >60 mL/min    Comment: (NOTE) The eGFR has been calculated using the CKD EPI equation. This calculation has not been validated in all clinical situations. eGFR's persistently <60 mL/min signify possible Chronic Kidney Disease.    Anion gap 12 5 - 15    Comment: Performed at [facility], [address], [place], [place] [address]  Blood gas, venous     Status: Abnormal   Collection Time: 09/17/18  8:02 PM  Result Value Ref Range   FIO2 21.00    Delivery systems ROOM AIR    pH, Ven 7.388 7.250 - 7.430   pCO2, Ven 36.5 (L) 44.0 - 60.0 mmHg   pO2, Ven 89.1 (H) 32.0 - 45.0 mmHg   Bicarbonate 22.3 20.0 - 28.0 mmol/L   Acid-base deficit 2.7 (H) 0.0 - 2.0 mmol/L   O2 Saturation 96.8 %   Collection site VEIN    Drawn by COLLECTED BY LABORATORY    Sample type VEIN     Comment: Performed at [facility], [address], [place], [place] [address]  CBC with Differential     Status: Abnormal   Collection Time: 09/17/18  8:02 PM  Result Value Ref Range   WBC 5.6 4.0 - 10.5 K/uL   RBC 3.88 3.87 - 5.11 MIL/uL   Hemoglobin 11.5 (L) 12.0 - 15.0 g/dL   HCT 36.5 36.0 - 46.0 %   MCV 94.1 80.0 - 100.0 fL   MCH 29.6 26.0 - 34.0 pg   MCHC 31.5 30.0 - 36.0 g/dL   RDW 14.3 11.5 - 15.5 %   Platelets 73 (L) 150 - 400 K/uL    Comment: SPECIMEN CHECKED FOR CLOTS   nRBC 0.0 0.0 - 0.2 %   Neutrophils Relative % 84 %   Neutro Abs 4.7 1.7 - 7.7 K/uL   Lymphocytes Relative 9 %   Lymphs Abs 0.5 (L) 0.7 - 4.0 K/uL   Monocytes Relative 5 %   Monocytes Absolute 0.3 0.1 - 1.0 K/uL   Eosinophils Relative 0 %   Eosinophils Absolute 0.0 0.0 - 0.5 K/uL   Basophils Relative 0 %   Basophils Absolute 0.0 0.0 - 0.1 K/uL   Immature Granulocytes 2 %   Abs Immature Granulocytes 0.09 (H) 0.00 - 0.07 K/uL    Comment: Performed at [facility], [address] [place],  [place] [address]  Urinalysis, Routine w reflex microscopic     Status: Abnormal   Collection Time: 09/17/18  8:02 PM  Result Value Ref Range   Color, Urine STRAW (A) YELLOW   APPearance CLEAR CLEAR   Specific Gravity, Urine 1.032 (H) 1.005 - 1.030   pH 7.0 5.0 - 8.0   Glucose, UA >=500 (A) NEGATIVE mg/dL   Hgb urine dipstick NEGATIVE NEGATIVE   Bilirubin Urine NEGATIVE NEGATIVE   Ketones, ur NEGATIVE NEGATIVE mg/dL   Protein, ur NEGATIVE NEGATIVE mg/dL   Nitrite NEGATIVE NEGATIVE   Leukocytes, UA NEGATIVE NEGATIVE   RBC / HPF 0-5 0 - 5 RBC/hpf   WBC, UA 0-5 0 - 5 WBC/hpf   Bacteria, UA NONE SEEN NONE SEEN   Squamous Epithelial / LPF 0-5 0 - 5    Comment: Performed at [facility], [address], [place], [place] [address]  CBG monitoring, ED     Status: Abnormal   Collection Time: 09/17/18 10:38 PM  Result Value Ref Range   Glucose-Capillary 376 (H) 70 - 99 mg/dL  Glucose, capillary     Status: Abnormal   Collection Time: 09/17/18 11:38 PM  Result Value Ref Range   Glucose-Capillary 298 (H) 70 - 99 mg/dL  Glucose, capillary     Status: Abnormal   Collection Time: 09/18/18 12:52 AM  Result Value Ref Range   Glucose-Capillary 239 (H) 70 - 99 mg/dL  Glucose, capillary     Status: Abnormal   Collection Time: 09/18/18  2:12 AM  Result Value Ref Range   Glucose-Capillary 206 (H) 70 - 99 mg/dL  Glucose, capillary     Status: Abnormal   Collection Time: 09/18/18  3:17 AM  Result Value Ref Range   Glucose-Capillary 191 (H) 70 - 99 mg/dL  CBC     Status: Abnormal   Collection Time: 09/18/18  4:33 AM  Result Value Ref Range   WBC 4.6 4.0 - 10.5 K/uL   RBC 3.69 (L) 3.87 - 5.11 MIL/uL   Hemoglobin 10.7 (L) 12.0 - 15.0 g/dL   HCT 34.7 (L) 36.0 - 46.0 %   MCV 94.0 80.0 - 100.0 fL   MCH  29.0 26.0 - 34.0 pg   MCHC 30.8 30.0 - 36.0 g/dL   RDW 14.2 11.5 - 15.5 %   Platelets 64 (L) 150 - 400 K/uL    Comment: SPECIMEN CHECKED FOR CLOTS Immature Platelet Fraction may be clinically indicated,  consider ordering this additional test [id number] CONSISTENT WITH PREVIOUS RESULT    nRBC 0.0 0.0 - 0.2 %    Comment: Performed at [facility], [address], [place], [place] [address]  Comprehensive metabolic panel     Status: Abnormal   Collection Time: 09/18/18  4:33 AM  Result Value Ref Range   Sodium 135 135 - 145 mmol/L   Potassium 3.8 3.5 - 5.1 mmol/L   Chloride 100 98 - 111 mmol/L   CO2 28 22 - 32 mmol/L   Glucose, Bld 166 (H) 70 - 99 mg/dL   BUN 15 6 - 20 mg/dL   Creatinine, Ser 0.51 0.44 - 1.00 mg/dL   Calcium 8.5 (L) 8.9 - 10.3 mg/dL   Total Protein 6.6 6.5 - 8.1 g/dL   Albumin 3.2 (L) 3.5 - 5.0 g/dL   AST 21 15 - 41 U/L   ALT 35 0 - 44 U/L   Alkaline Phosphatase 68 38 - 126 U/L   Total Bilirubin 0.5 0.3 - 1.2 mg/dL   GFR calc non Af Amer >60 >60 mL/min   GFR calc Af Amer >60 >60 mL/min    Comment: (NOTE) The eGFR has been calculated using the CKD EPI equation. This calculation has not been validated in all clinical situations. eGFR's persistently <60 mL/min signify possible Chronic Kidney Disease.    Anion gap 7 5 - 15    Comment: Performed at [facility], [address], [place], [place] [address]  Glucose, capillary     Status: Abnormal   Collection Time: 09/18/18  5:32 AM  Result Value Ref Range   Glucose-Capillary 156 (H) 70 - 99 mg/dL  Glucose, capillary     Status: Abnormal   Collection Time: 09/18/18  7:46 AM  Result Value Ref Range   Glucose-Capillary 123 (H) 70 - 99 mg/dL  Glucose, capillary     Status: Abnormal   Collection Time: 09/18/18 11:36 AM  Result Value Ref Range   Glucose-Capillary 219 (H) 70 - 99 mg/dL  Glucose, capillary     Status: Abnormal   Collection Time: 09/18/18  5:29 PM  Result Value Ref Range   Glucose-Capillary 349 (H) 70 - 99 mg/dL  Glucose, capillary     Status: Abnormal   Collection Time: 09/18/18  8:59 PM  Result Value Ref Range   Glucose-Capillary 352 (H) 70 - 99 mg/dL   Comment 1 Notify RN    Comment 2 Document in  Chart     ABGS Recent Labs    09/17/18 2002  HCO3 22.3   CULTURES Recent Results (from the past 240 hour(s))  MRSA PCR Screening     Status: None   Collection Time: 09/15/18 11:15 AM  Result Value Ref Range Status   MRSA by PCR NEGATIVE NEGATIVE Final    Comment:        The GeneXpert MRSA Assay (FDA approved for NASAL specimens only), is one component of a comprehensive MRSA colonization surveillance program. It is not intended to diagnose MRSA infection nor to guide or monitor treatment for MRSA infections. Performed at [facility], [address], [place], [place] [address]    Studies/Results: Dg Chest 2 View  Result Date: 09/17/2018 CLINICAL DATA:  Acute  onset of hyperglycemia. EXAM: CHEST - 2 VIEW COMPARISON:  Chest radiograph performed 09/15/2018 FINDINGS: The lungs are well-aerated. Vascular congestion is noted. Increased interstitial markings raise concern for mild pulmonary edema. There is no evidence of pleural effusion or pneumothorax. The heart is normal in size; the mediastinal contour is within normal limits. No acute osseous abnormalities are seen. IMPRESSION: Vascular congestion. Increased interstitial markings raise concern for mild pulmonary edema. Electronically Signed   By: Garald Balding M.D.   On: 09/17/2018 21:13    Medications:  Prior to Admission:  Medications Prior to Admission  Medication Sig Dispense Refill Last Dose  . albuterol (PROVENTIL) (2.5 MG/3ML) 0.083% nebulizer solution Take 2.5 mg by nebulization every 6 (six) hours as needed for wheezing or shortness of breath.   Past Week at Unknown time  . butorphanol (STADOL) 10 MG/ML nasal spray Place 1 spray into the nose every 4 (four) hours as needed (breakthrough pain).   Past Week at Unknown time  . cefdinir (OMNICEF) 300 MG capsule Take 1 capsule (300 mg total) by mouth every 12 (twelve) hours. 10 capsule 0 09/17/2018  . cetirizine (ZYRTEC) 10 MG tablet Take 10 mg by mouth every evening.    09/17/2018  . clonazePAM (KLONOPIN) 1 MG tablet Take 1 mg by mouth 4 (four) times daily.    09/17/2018  . diclofenac sodium (VOLTAREN) 1 % GEL Apply 2 g topically 4 (four) times daily as needed (pain).   99 unknown  . etonogestrel (NEXPLANON) 68 MG IMPL implant 1 each by Subdermal route once.     . insulin NPH-regular Human (70-30) 100 UNIT/ML injection Inject 20 Units into the skin 2 (two) times daily with a meal.   09/17/2018  . lactulose (CHRONULAC) 10 GM/15ML solution Take 15 mLs by mouth 3 (three) times daily.   09/17/2018  . metFORMIN (GLUCOPHAGE-XR) 500 MG 24 hr tablet Take 1,000 mg by mouth 2 (two) times daily.   09/17/2018  . methadone (DOLOPHINE) 10 MG tablet Take 2 tablets (20 mg total) by mouth every 12 (twelve) hours. (Patient taking differently: Take 10 mg by mouth 5 (five) times daily. ) 120 tablet 0 09/17/2018  . pantoprazole (PROTONIX) 40 MG tablet Take 40 mg by mouth daily.   09/17/2018  . potassium chloride SA (K-DUR,KLOR-CON) 20 MEQ tablet Take 2 tablets by mouth 2 (two) times daily.   09/17/2018  . predniSONE (STERAPRED UNI-PAK 21 TAB) 10 MG (21) TBPK tablet Take by package instructions 21 tablet 0 09/17/2018  . prochlorperazine (COMPAZINE) 5 MG tablet Take 1 tablet by mouth every 6 (six) hours as needed for nausea.   0 09/17/2018  . promethazine (PHENERGAN) 25 MG tablet Take 25 mg by mouth daily as needed for nausea or vomiting.   09/17/2018  . ranitidine (ZANTAC) 150 MG tablet Take 150 mg by mouth at bedtime.   09/17/2018  . spironolactone (ALDACTONE) 50 MG tablet Take 2 tablets by mouth daily.   09/17/2018  . sucralfate (CARAFATE) 1 g tablet Take 1 g by mouth 4 (four) times daily as needed (ulcers).    09/17/2018  . Suvorexant (BELSOMRA) 20 MG TABS Take 20 mg by mouth at bedtime.    09/16/2018 at Unknown time  . tetrahydrozoline 0.05 % ophthalmic solution Place 2 drops into both eyes 2 (two) times daily as needed (allergies/dry/itchy).   Past Month at Unknown time  .  torsemide (DEMADEX) 20 MG tablet Take 2 tablets by mouth daily.   09/17/2018  . vortioxetine HBr (TRINTELLIX) 20  MG TABS tablet Take 20 mg by mouth every evening.    09/16/2018 at Unknown time  . zolpidem (AMBIEN) 10 MG tablet Take 1 tablet by mouth at bedtime.   5 09/16/2018 at Unknown time   Scheduled: . cefdinir  300 mg Oral Q12H  . clonazePAM  1 mg Oral QID  . enoxaparin (LOVENOX) injection  40 mg Subcutaneous Q24H  . famotidine  20 mg Oral Daily  . guaiFENesin  1,200 mg Oral BID  . insulin aspart  0-20 Units Subcutaneous TID WC  . insulin aspart  0-5 Units Subcutaneous QHS  . insulin aspart  3 Units Subcutaneous TID WC  . insulin glargine  25 Units Subcutaneous Daily  . lactulose  10 g Oral TID  . loratadine  10 mg Oral Daily  . methadone  10 mg Oral Q6H  . pantoprazole  40 mg Oral Daily  . potassium chloride SA  40 mEq Oral BID  . predniSONE  40 mg Oral Q breakfast  . sodium chloride flush  3 mL Intravenous Q12H  . spironolactone  100 mg Oral Daily  . torsemide  40 mg Oral Daily  . vortioxetine HBr  20 mg Oral QPM  . zolpidem  5 mg Oral QHS   Continuous: . sodium chloride    . sodium chloride 50 mL/hr at 09/19/18 0031  . dextrose 5 % and 0.45% NaCl Stopped (09/17/18 2345)   [id number]:[id number] chloride, acetaminophen **OR** acetaminophen, albuterol, dextrose, ondansetron **OR** ondansetron (ZOFRAN) IV, sodium chloride flush, sucralfate  Assesment: She was admitted with hyperglycemia.  She was discharged home on insulin and she is still on steroids.  She did not take her insulin at home because she been in the hospital and when she checked her blood sugar it was greater than 500.  She came to the emergency department was started on insulin continuous infusion and now is on basal insulin plus sliding scale.  She is still receiving D5W and that will be discontinued  She has COPD exacerbation/asthma exacerbation and she is on steroids and antibiotics which will continue  She has morbid  obesity unchanged  She has cirrhosis of the liver and has had ascites and that seems pretty stable  She has chronic migraine headaches and had one last night but she is doing okay now Active Problems:   Hyperglycemia    Plan: Continue treatments.  Increase Lantus.  Discontinue D5W.  Transfer out of stepdown.  Full admission.    LOS: 0 days   Georganne Siple L 09/19/2018, 7:38 AM

## 2018-09-19 NOTE — Care Management Note (Signed)
Case Management Note  Patient Details  Name: Jaime [name] MRN: [id number] Date of Birth: 11-May-1986  Subjective/Objective:   Chart reviewed. Hyperglycemia, COPD/asthma exacerbation. From home. Has PCP. Has Medicaid. Currently on room air. Will need sleep study as outpatient.              Action/Plan: DC home. No CM needs known. Expected Discharge Date:  09/19/18               Expected Discharge Plan:  Home/Self Care  In-House Referral:     Discharge planning Services  CM Consult  Post Acute Care Choice:  NA Choice offered to:  NA  DME Arranged:    DME Agency:     HH Arranged:    HH Agency:     Status of Service:  Completed, signed off  If discussed at [employer] of Stay Meetings, dates discussed:    Additional Comments:  Jaime [name], Jaime Reading, RN 09/19/2018, 3:12 PM

## 2018-09-19 NOTE — Progress Notes (Signed)
*  PRELIMINARY RESULTS* Echocardiogram 2D Echocardiogram has been performed.  Jaime [name] 09/19/2018, 4:02 PM

## 2018-09-19 NOTE — Progress Notes (Signed)
Patient called this RN into the room stating that she felt very anxious and wanted to go home. This RN explained to patient that she would be leaving against medical advice, and she decided against that. Patient requested that I call the MD to get her anxiety and/or pain medication. This RN paged Dr.Hawkins who instructed this RN to give patient her scheduled methadone and klonopin early. Medications given per his verbal instructions.   Celestia Khat, RN

## 2018-09-20 LAB — GLUCOSE, CAPILLARY
Glucose-Capillary: 146 mg/dL — ABNORMAL HIGH (ref 70–99)
Glucose-Capillary: 326 mg/dL — ABNORMAL HIGH (ref 70–99)
Glucose-Capillary: 257 mg/dL — ABNORMAL HIGH (ref 70–99)
Glucose-Capillary: 220 mg/dL — ABNORMAL HIGH (ref 70–99)

## 2018-09-20 MED ORDER — LORAZEPAM 1 MG PO TABS
1.0000 mg | ORAL_TABLET | Freq: Four times a day (QID) | ORAL | Status: DC
  Administered 2018-09-20 – 2018-09-21 (×5): 1 mg via ORAL
  Filled 2018-09-20 (×5): qty 1

## 2018-09-20 NOTE — Progress Notes (Signed)
Spoke with Dr. Luan Pulling regarding patient losing IV site. Order given to leave IV out and stop IV fluids in anticipation for discharge home tomorrow.

## 2018-09-20 NOTE — Progress Notes (Signed)
Subjective: She feels better.  She had significant anxiety yesterday and had what seemed to be a panic attack.  She is doing better as far as her breathing is concerned.  Her blood sugar is better  Objective: Vital signs in last 24 hours: Temp:  [98.4 F (36.9 C)-98.9 F (37.2 C)] 98.5 F (36.9 C) (11/19 0501) Pulse Rate:  [85-97] 92 (11/19 0501) Resp:  [10-19] 19 (11/19 0501) BP: (108-142)/(57-85) 128/77 (11/19 0501) SpO2:  [90 %-96 %] 92 % (11/19 0501) Weight change:     Intake/Output from previous day: 11/18 0701 - 11/19 0700 In: 1057.8 [P.O.:360; I.V.:697.8] Out: -   PHYSICAL EXAM General appearance: alert, cooperative, mild distress and morbidly obese Resp: rhonchi bilaterally Cardio: regular rate and rhythm, S1, S2 normal, no murmur, click, rub or gallop GI: soft, non-tender; bowel sounds normal; no masses,  no organomegaly Extremities: extremities normal, atraumatic, no cyanosis or edema  Lab Results:  Results for orders placed or performed during the hospital encounter of 09/17/18 (from the past 48 hour(s))  Glucose, capillary     Status: Abnormal   Collection Time: 09/18/18 11:36 AM  Result Value Ref Range   Glucose-Capillary 219 (H) 70 - 99 mg/dL  Glucose, capillary     Status: Abnormal   Collection Time: 09/18/18  5:29 PM  Result Value Ref Range   Glucose-Capillary 349 (H) 70 - 99 mg/dL  Glucose, capillary     Status: Abnormal   Collection Time: 09/18/18  8:59 PM  Result Value Ref Range   Glucose-Capillary 352 (H) 70 - 99 mg/dL   Comment 1 Notify RN    Comment 2 Document in Chart   Glucose, capillary     Status: Abnormal   Collection Time: 09/19/18  7:54 AM  Result Value Ref Range   Glucose-Capillary 196 (H) 70 - 99 mg/dL  Glucose, capillary     Status: Abnormal   Collection Time: 09/19/18 11:18 AM  Result Value Ref Range   Glucose-Capillary 257 (H) 70 - 99 mg/dL  Glucose, capillary     Status: Abnormal   Collection Time: 09/19/18  4:17 PM  Result  Value Ref Range   Glucose-Capillary 306 (H) 70 - 99 mg/dL  Glucose, capillary     Status: Abnormal   Collection Time: 09/19/18 10:02 PM  Result Value Ref Range   Glucose-Capillary 193 (H) 70 - 99 mg/dL   Comment 1 Notify RN    Comment 2 Document in Chart   Glucose, capillary     Status: Abnormal   Collection Time: 09/20/18  8:17 AM  Result Value Ref Range   Glucose-Capillary 146 (H) 70 - 99 mg/dL    ABGS Recent Labs    09/17/18 2002  HCO3 22.3   CULTURES Recent Results (from the past 240 hour(s))  MRSA PCR Screening     Status: None   Collection Time: 09/15/18 11:15 AM  Result Value Ref Range Status   MRSA by PCR NEGATIVE NEGATIVE Final    Comment:        The GeneXpert MRSA Assay (FDA approved for NASAL specimens only), is one component of a comprehensive MRSA colonization surveillance program. It is not intended to diagnose MRSA infection nor to guide or monitor treatment for MRSA infections. Performed at [facility], [address] [place], [place] [address]    Studies/Results: No results found.  Medications:  Prior to Admission:  Medications Prior to Admission  Medication Sig Dispense Refill Last Dose  . albuterol (PROVENTIL) (2.5 MG/3ML) 0.083%  nebulizer solution Take 2.5 mg by nebulization every 6 (six) hours as needed for wheezing or shortness of breath.   Past Week at Unknown time  . butorphanol (STADOL) 10 MG/ML nasal spray Place 1 spray into the nose every 4 (four) hours as needed (breakthrough pain).   Past Week at Unknown time  . cefdinir (OMNICEF) 300 MG capsule Take 1 capsule (300 mg total) by mouth every 12 (twelve) hours. 10 capsule 0 09/17/2018  . cetirizine (ZYRTEC) 10 MG tablet Take 10 mg by mouth every evening.   09/17/2018  . clonazePAM (KLONOPIN) 1 MG tablet Take 1 mg by mouth 4 (four) times daily.    09/17/2018  . diclofenac sodium (VOLTAREN) 1 % GEL Apply 2 g topically 4 (four) times daily as needed (pain).   99 unknown  . etonogestrel  (NEXPLANON) 68 MG IMPL implant 1 each by Subdermal route once.     . insulin NPH-regular Human (70-30) 100 UNIT/ML injection Inject 20 Units into the skin 2 (two) times daily with a meal.   09/17/2018  . lactulose (CHRONULAC) 10 GM/15ML solution Take 15 mLs by mouth 3 (three) times daily.   09/17/2018  . metFORMIN (GLUCOPHAGE-XR) 500 MG 24 hr tablet Take 1,000 mg by mouth 2 (two) times daily.   09/17/2018  . methadone (DOLOPHINE) 10 MG tablet Take 2 tablets (20 mg total) by mouth every 12 (twelve) hours. (Patient taking differently: Take 10 mg by mouth 5 (five) times daily. ) 120 tablet 0 09/17/2018  . pantoprazole (PROTONIX) 40 MG tablet Take 40 mg by mouth daily.   09/17/2018  . potassium chloride SA (K-DUR,KLOR-CON) 20 MEQ tablet Take 2 tablets by mouth 2 (two) times daily.   09/17/2018  . predniSONE (STERAPRED UNI-PAK 21 TAB) 10 MG (21) TBPK tablet Take by package instructions 21 tablet 0 09/17/2018  . prochlorperazine (COMPAZINE) 5 MG tablet Take 1 tablet by mouth every 6 (six) hours as needed for nausea.   0 09/17/2018  . promethazine (PHENERGAN) 25 MG tablet Take 25 mg by mouth daily as needed for nausea or vomiting.   09/17/2018  . ranitidine (ZANTAC) 150 MG tablet Take 150 mg by mouth at bedtime.   09/17/2018  . spironolactone (ALDACTONE) 50 MG tablet Take 2 tablets by mouth daily.   09/17/2018  . sucralfate (CARAFATE) 1 g tablet Take 1 g by mouth 4 (four) times daily as needed (ulcers).    09/17/2018  . Suvorexant (BELSOMRA) 20 MG TABS Take 20 mg by mouth at bedtime.    09/16/2018 at Unknown time  . tetrahydrozoline 0.05 % ophthalmic solution Place 2 drops into both eyes 2 (two) times daily as needed (allergies/dry/itchy).   Past Month at Unknown time  . torsemide (DEMADEX) 20 MG tablet Take 2 tablets by mouth daily.   09/17/2018  . vortioxetine HBr (TRINTELLIX) 20 MG TABS tablet Take 20 mg by mouth every evening.    09/16/2018 at Unknown time  . zolpidem (AMBIEN) 10 MG tablet Take 1  tablet by mouth at bedtime.   5 09/16/2018 at Unknown time   Scheduled: . cefdinir  300 mg Oral Q12H  . enoxaparin (LOVENOX) injection  40 mg Subcutaneous Q24H  . famotidine  20 mg Oral Daily  . guaiFENesin  1,200 mg Oral BID  . insulin aspart  0-20 Units Subcutaneous TID WC  . insulin aspart  0-5 Units Subcutaneous QHS  . insulin glargine  25 Units Subcutaneous Daily  . lactulose  10 g Oral TID  . loratadine  10 mg Oral Daily  . LORazepam  1 mg Oral Q6H  . methadone  10 mg Oral Q6H  . pantoprazole  40 mg Oral Daily  . potassium chloride SA  40 mEq Oral BID  . predniSONE  40 mg Oral Q breakfast  . sodium chloride flush  3 mL Intravenous Q12H  . spironolactone  100 mg Oral Daily  . torsemide  40 mg Oral Daily  . vortioxetine HBr  20 mg Oral QPM  . zolpidem  5 mg Oral QHS   Continuous: . sodium chloride    . sodium chloride 50 mL/hr at 09/19/18 1926   [id number] chloride, acetaminophen **OR** acetaminophen, albuterol, dextrose, ondansetron **OR** ondansetron (ZOFRAN) IV, sodium chloride flush, sucralfate  Assesment: She was admitted with uncontrolled diabetes.  She also has COPD exacerbation and that is improving.  At baseline she has anxiety and depression/bipolar disease and she had a panic attack last night that did respond to Ativan.  She has cirrhosis of the liver so we have to be careful with meds. Active Problems:   Hyperglycemia   Uncontrolled diabetes mellitus ([place])    Plan: Switch from Klonopin to Ativan.  Continue other treatments.  Potential discharge tomorrow    LOS: 1 day   Jas Betten L 09/20/2018, 8:43 AM

## 2018-09-20 NOTE — Progress Notes (Signed)
Inpatient Diabetes Program Recommendations  AACE/ADA: New Consensus Statement on Inpatient Glycemic Control (2019)  Target Ranges:  Prepandial:   less than 140 mg/dL      Peak postprandial:   less than 180 mg/dL (1-2 hours)      Critically ill patients:  140 - 180 mg/dL   Results for CHEREE, FOWLES (MRN [id number]) as of 09/20/2018 08:29  Ref. Range 09/19/2018 07:54 09/19/2018 11:18 09/19/2018 16:17 09/19/2018 22:02 09/20/2018 08:17  Glucose-Capillary Latest Ref Range: 70 - 99 mg/dL 196 (H) 257 (H) 306 (H) 193 (H) 146 (H)   Review of Glycemic Control  Diabetes history: DM2 Outpatient Diabetes medications: 70/30 20 units BID, Metformin XR 1000 mg BID Current orders for Inpatient glycemic control: Lantus 25 units daily, Novolog 0-20 units TID with meals, Novolog 0-5 units QHS; Prednisone 40 mg QAM  Inpatient Diabetes Program Recommendations:  Insulin - Meal Coverage: If post prandial glucose is consistently greater than 180 mg/dl, please consider ordering Novolog 4 units TID with meals for meal coverage if patient eats at least 50% of meals.  Thanks, Barnie Alderman, RN, MSN, CDE Diabetes Coordinator Inpatient Diabetes Program [phone] (Team Pager from 8am to 5pm)

## 2018-09-21 LAB — GLUCOSE, CAPILLARY: Glucose-Capillary: 119 mg/dL — ABNORMAL HIGH (ref 70–99)

## 2018-09-21 MED ORDER — INSULIN ASPART 100 UNIT/ML ~~LOC~~ SOLN: 0.0000 [IU] | Freq: Every day | SUBCUTANEOUS | 11 refills | Status: DC

## 2018-09-21 MED ORDER — INSULIN ASPART 100 UNIT/ML ~~LOC~~ SOLN: 0.0000 [IU] | Freq: Three times a day (TID) | SUBCUTANEOUS | 11 refills | Status: DC

## 2018-09-21 MED ORDER — LORAZEPAM 1 MG PO TABS: 1.0000 mg | ORAL_TABLET | Freq: Four times a day (QID) | ORAL | 0 refills | Status: DC

## 2018-09-21 NOTE — Discharge Summary (Signed)
Physician Discharge Summary  Patient ID: Jaime [name] MRN: [id number] DOB/AGE: Oct 07, 1986 32 y.o. Primary Care Physician:Olander Friedl, Percell Miller, MD Admit date: 09/17/2018 Discharge date: 09/21/2018    Discharge Diagnoses:   Active Problems:   Hyperglycemia   Uncontrolled diabetes mellitus (HCC) Asthma/COPD exacerbation Panic attack Anxiety Depression Morbid obesity Nocturnal hypoxia Hepatitis C Cirrhosis of the liver  Allergies as of 09/21/2018      Reactions   Iodine-131 Shortness Of Breath   Ketorolac Shortness Of Breath   Ketorolac Tromethamine Shortness Of Breath   Ivp Dye [iodinated Diagnostic Agents] Other (See Comments)   Skin gets very red, unable to walk   Nsaids Other (See Comments)   Flares ulcers   Toradol [ketorolac Tromethamine]    sob   Vancomycin    Facial swelling,       Medication List    STOP taking these medications   clonazePAM 1 MG tablet Commonly known as:  KLONOPIN     TAKE these medications   albuterol (2.5 MG/3ML) 0.083% nebulizer solution Commonly known as:  PROVENTIL Take 2.5 mg by nebulization every 6 (six) hours as needed for wheezing or shortness of breath.   BELSOMRA 20 MG Tabs Generic drug:  Suvorexant Take 20 mg by mouth at bedtime.   butorphanol 10 MG/ML nasal spray Commonly known as:  STADOL Place 1 spray into the nose every 4 (four) hours as needed (breakthrough pain).   cefdinir 300 MG capsule Commonly known as:  OMNICEF Take 1 capsule (300 mg total) by mouth every 12 (twelve) hours.   cetirizine 10 MG tablet Commonly known as:  ZYRTEC Take 10 mg by mouth every evening.   diclofenac sodium 1 % Gel Commonly known as:  VOLTAREN Apply 2 g topically 4 (four) times daily as needed (pain).   insulin aspart 100 UNIT/ML injection Commonly known as:  novoLOG Inject 0-20 Units into the skin 3 (three) times daily with meals.   insulin aspart 100 UNIT/ML injection Commonly known as:  novoLOG Inject 0-5 Units into the  skin at bedtime.   insulin NPH-regular Human (70-30) 100 UNIT/ML injection Inject 20 Units into the skin 2 (two) times daily with a meal.   lactulose 10 GM/15ML solution Commonly known as:  CHRONULAC Take 15 mLs by mouth 3 (three) times daily.   LORazepam 1 MG tablet Commonly known as:  ATIVAN Take 1 tablet (1 mg total) by mouth every 6 (six) hours.   metFORMIN 500 MG 24 hr tablet Commonly known as:  GLUCOPHAGE-XR Take 1,000 mg by mouth 2 (two) times daily.   methadone 10 MG tablet Commonly known as:  DOLOPHINE Take 2 tablets (20 mg total) by mouth every 12 (twelve) hours. What changed:    how much to take  when to take this   NEXPLANON 68 MG Impl implant Generic drug:  etonogestrel 1 each by Subdermal route once.   pantoprazole 40 MG tablet Commonly known as:  PROTONIX Take 40 mg by mouth daily.   potassium chloride SA 20 MEQ tablet Commonly known as:  K-DUR,KLOR-CON Take 2 tablets by mouth 2 (two) times daily.   predniSONE 10 MG (21) Tbpk tablet Commonly known as:  STERAPRED UNI-PAK 21 TAB Take by package instructions   prochlorperazine 5 MG tablet Commonly known as:  COMPAZINE Take 1 tablet by mouth every 6 (six) hours as needed for nausea.   promethazine 25 MG tablet Commonly known as:  PHENERGAN Take 25 mg by mouth daily as needed for nausea or vomiting.  ranitidine 150 MG tablet Commonly known as:  ZANTAC Take 150 mg by mouth at bedtime.   spironolactone 50 MG tablet Commonly known as:  ALDACTONE Take 2 tablets by mouth daily.   sucralfate 1 g tablet Commonly known as:  CARAFATE Take 1 g by mouth 4 (four) times daily as needed (ulcers).   tetrahydrozoline 0.05 % ophthalmic solution Place 2 drops into both eyes 2 (two) times daily as needed (allergies/dry/itchy).   torsemide 20 MG tablet Commonly known as:  DEMADEX Take 2 tablets by mouth daily.   TRINTELLIX 20 MG Tabs tablet Generic drug:  vortioxetine HBr Take 20 mg by mouth every  evening.   zolpidem 10 MG tablet Commonly known as:  AMBIEN Take 1 tablet by mouth at bedtime.            Durable Medical Equipment  (From admission, onward)         Start     Ordered   09/21/18 0000  For home use only DME oxygen    Question Answer Comment  Liters per Minute 2   Frequency Only at night (stationary unit needed)   Oxygen conserving device No   Oxygen delivery system Gas      09/21/18 0922          Discharged Condition: Improved    Consults: None  Significant Diagnostic Studies: Dg Chest 2 View  Result Date: 09/17/2018 CLINICAL DATA:  Acute onset of hyperglycemia. EXAM: CHEST - 2 VIEW COMPARISON:  Chest radiograph performed 09/15/2018 FINDINGS: The lungs are well-aerated. Vascular congestion is noted. Increased interstitial markings raise concern for mild pulmonary edema. There is no evidence of pleural effusion or pneumothorax. The heart is normal in size; the mediastinal contour is within normal limits. No acute osseous abnormalities are seen. IMPRESSION: Vascular congestion. Increased interstitial markings raise concern for mild pulmonary edema. Electronically Signed   By: Garald Balding M.D.   On: 09/17/2018 21:13   Dg Chest Port 1 View  Result Date: 09/15/2018 CLINICAL DATA:  Acute onset of nausea and vomiting. Cough, congestion and sore throat. EXAM: PORTABLE CHEST 1 VIEW COMPARISON:  Chest radiograph performed 07/23/2018 FINDINGS: The lungs are hypoexpanded. Vascular crowding and vascular congestion are seen. There is no evidence of focal opacification, pleural effusion or pneumothorax. The cardiomediastinal silhouette is mildly enlarged. No acute osseous abnormalities are seen. IMPRESSION: Lungs hypoexpanded. Vascular congestion and mild cardiomegaly noted. Lungs remain grossly clear. Electronically Signed   By: Garald Balding M.D.   On: 09/15/2018 06:29    Lab Results: Basic Metabolic Panel: No results for input(s): NA, K, CL, CO2, GLUCOSE,  BUN, CREATININE, CALCIUM, MG, PHOS in the last 72 hours. Liver Function Tests: No results for input(s): AST, ALT, ALKPHOS, BILITOT, PROT, ALBUMIN in the last 72 hours.   CBC: No results for input(s): WBC, NEUTROABS, HGB, HCT, MCV, PLT in the last 72 hours.  Recent Results (from the past 240 hour(s))  MRSA PCR Screening     Status: None   Collection Time: 09/15/18 11:15 AM  Result Value Ref Range Status   MRSA by PCR NEGATIVE NEGATIVE Final    Comment:        The GeneXpert MRSA Assay (FDA approved for NASAL specimens only), is one component of a comprehensive MRSA colonization surveillance program. It is not intended to diagnose MRSA infection nor to guide or monitor treatment for MRSA infections. Performed at [facility] [address], [place], [place] [address]      Hospital Course: This  is a 32 year old who is been in the hospital with COPD exacerbation and was discharged.  She initially improved but then she developed increasing blood sugar.  She had not taken her home insulin.  She came back to the emergency room and was found to have a blood sugar of greater than 500 and was brought into the hospital for that.  She initially was given an insulin drip but that was discontinued when she got better control.  She was started on long-acting insulin and sliding scale and improved.  She had a panic attack while she was in the hospital and her Klonopin was changed to lorazepam with better control.  She continued antibiotics and steroids for her COPD exacerbation and she improved.  By the time of discharge she was back at baseline  Discharge Exam: Blood pressure 114/69, pulse 85, temperature 98.3 F (36.8 C), temperature source Oral, resp. rate 20, height 5' 10"  (1.778 m), weight 128.7 kg, SpO2 94 %. She is awake and alert.  She is morbidly obese.  Her chest is clear.  Disposition: Home with home health services.  She is high risk for return because of her multiple medical problems  and poor support system  Discharge Instructions    Face-to-face encounter (required for Medicare/Medicaid patients)   Complete by:  As directed    I Honesty Menta L certify that this patient is under my care and that I, or a nurse practitioner or physician's assistant working with me, had a face-to-face encounter that meets the physician face-to-face encounter requirements with this patient on 09/21/2018. The encounter with the patient was in whole, or in part for the following medical condition(s) which is the primary reason for home health care (List medical condition): Uncontrolled diabetes/COPD exacerbation   The encounter with the patient was in whole, or in part, for the following medical condition, which is the primary reason for home health care:  Uncontrolled diabetes/COPD exacerbation   I certify that, based on my findings, the following services are medically necessary home health services:  Nursing   Reason for Medically Necessary Home Health Services:  Skilled Nursing- Change/Decline in Patient Status   My clinical findings support the need for the above services:  Unable to leave home safely without assistance and/or assistive device   Further, I certify that my clinical findings support that this patient is homebound due to:  Unable to leave home safely without assistance   For home use only DME oxygen   Complete by:  As directed    Liters per Minute:  2   Frequency:  Only at night (stationary unit needed)   Oxygen conserving device:  No   Oxygen delivery system:  Gas   Home Health   Complete by:  As directed    Help with management of diabetes   To provide the following care/treatments:  RN        Signed: Jarion Hawthorne L   09/21/2018, 9:22 AM

## 2018-09-21 NOTE — Care Management Note (Signed)
Case Management Note  Patient Details  Name: Jaime [name] MRN: [id number] Date of Birth: April 17, 1986  Discharging home today with referrals for nighttime oxygen and nursing. Pt okay with using [facility] for charity care program. Vaughan Basta, [facility] rep given referral. Pt aware HH has 48 hrs to make first visit. MD has made changes to pt's insulin to include sliding scale. MD sent Rx for novolog to [facility] and pt will need Novolin as it is cheaper. CM has called pharmacy and they will make sure pt gets correct brand.   Expected Discharge Date:  09/21/18               Expected Discharge Plan:  [place]  In-House Referral:  NA  Discharge planning Services  CM Consult  Post Acute Care Choice:  Durable Medical Equipment, Home Health Choice offered to:  Patient  DME Arranged:  Oxygen DME Agency:  [place]:  RN [facility] Agency:    [facility].   Status of Service:  Completed, signed off   Sherald Barge, RN 09/21/2018, 11:00 AM

## 2018-09-21 NOTE — Progress Notes (Signed)
Pt discharged home today per Dr. Luan Pulling. Pt's VSS. Pt provided with home medication list, discharge instructions and prescriptions. Verbalized understanding. Pt left floor via WC in stable condition accompanied by NT.

## 2018-09-21 NOTE — Progress Notes (Signed)
Subjective: She says she feels better.  She lost her IV yesterday.  She feels like she wants to go home.  She is coughing a little bit but not much.  Objective: Vital signs in last 24 hours: Temp:  [97.6 F (36.4 C)-98.3 F (36.8 C)] 98.3 F (36.8 C) (11/20 0527) Pulse Rate:  [85-91] 85 (11/20 0527) Resp:  [16-20] 20 (11/20 0527) BP: (114-127)/(69-75) 114/69 (11/20 0527) SpO2:  [93 %-94 %] 94 % (11/20 0527) Weight change:  Last BM Date: 09/19/18  Intake/Output from previous day: 11/19 0701 - 11/20 0700 In: 2135.8 [P.O.:1750; I.V.:385.8] Out: -   PHYSICAL EXAM General appearance: alert, cooperative and morbidly obese Resp: clear to auscultation bilaterally Cardio: regular rate and rhythm, S1, S2 normal, no murmur, click, rub or gallop GI: soft, non-tender; bowel sounds normal; no masses,  no organomegaly Extremities: extremities normal, atraumatic, no cyanosis or edema  Lab Results:  Results for orders placed or performed during the hospital encounter of 09/17/18 (from the past 48 hour(s))  Glucose, capillary     Status: Abnormal   Collection Time: 09/19/18 11:18 AM  Result Value Ref Range   Glucose-Capillary 257 (H) 70 - 99 mg/dL  Glucose, capillary     Status: Abnormal   Collection Time: 09/19/18  4:17 PM  Result Value Ref Range   Glucose-Capillary 306 (H) 70 - 99 mg/dL  Glucose, capillary     Status: Abnormal   Collection Time: 09/19/18 10:02 PM  Result Value Ref Range   Glucose-Capillary 193 (H) 70 - 99 mg/dL   Comment 1 Notify RN    Comment 2 Document in Chart   Glucose, capillary     Status: Abnormal   Collection Time: 09/20/18  8:17 AM  Result Value Ref Range   Glucose-Capillary 146 (H) 70 - 99 mg/dL  Glucose, capillary     Status: Abnormal   Collection Time: 09/20/18 11:20 AM  Result Value Ref Range   Glucose-Capillary 326 (H) 70 - 99 mg/dL  Glucose, capillary     Status: Abnormal   Collection Time: 09/20/18  4:25 PM  Result Value Ref Range   Glucose-Capillary 257 (H) 70 - 99 mg/dL   Comment 1 Notify RN    Comment 2 Document in Chart   Glucose, capillary     Status: Abnormal   Collection Time: 09/20/18  9:32 PM  Result Value Ref Range   Glucose-Capillary 220 (H) 70 - 99 mg/dL   Comment 1 Notify RN    Comment 2 Document in Chart   Glucose, capillary     Status: Abnormal   Collection Time: 09/21/18  7:50 AM  Result Value Ref Range   Glucose-Capillary 119 (H) 70 - 99 mg/dL    ABGS No results for input(s): PHART, PO2ART, TCO2, HCO3 in the last 72 hours.  Invalid input(s): PCO2 CULTURES Recent Results (from the past 240 hour(s))  MRSA PCR Screening     Status: None   Collection Time: 09/15/18 11:15 AM  Result Value Ref Range Status   MRSA by PCR NEGATIVE NEGATIVE Final    Comment:        The GeneXpert MRSA Assay (FDA approved for NASAL specimens only), is one component of a comprehensive MRSA colonization surveillance program. It is not intended to diagnose MRSA infection nor to guide or monitor treatment for MRSA infections. Performed at [facility], [address], [place], [place] [address]    Studies/Results: No results found.  Medications:  Prior to Admission:  Medications Prior to  Admission  Medication Sig Dispense Refill Last Dose  . albuterol (PROVENTIL) (2.5 MG/3ML) 0.083% nebulizer solution Take 2.5 mg by nebulization every 6 (six) hours as needed for wheezing or shortness of breath.   Past Week at Unknown time  . butorphanol (STADOL) 10 MG/ML nasal spray Place 1 spray into the nose every 4 (four) hours as needed (breakthrough pain).   Past Week at Unknown time  . cefdinir (OMNICEF) 300 MG capsule Take 1 capsule (300 mg total) by mouth every 12 (twelve) hours. 10 capsule 0 09/17/2018  . cetirizine (ZYRTEC) 10 MG tablet Take 10 mg by mouth every evening.   09/17/2018  . clonazePAM (KLONOPIN) 1 MG tablet Take 1 mg by mouth 4 (four) times daily.    09/17/2018  . diclofenac sodium (VOLTAREN) 1 % GEL  Apply 2 g topically 4 (four) times daily as needed (pain).   99 unknown  . etonogestrel (NEXPLANON) 68 MG IMPL implant 1 each by Subdermal route once.     . insulin NPH-regular Human (70-30) 100 UNIT/ML injection Inject 20 Units into the skin 2 (two) times daily with a meal.   09/17/2018  . lactulose (CHRONULAC) 10 GM/15ML solution Take 15 mLs by mouth 3 (three) times daily.   09/17/2018  . metFORMIN (GLUCOPHAGE-XR) 500 MG 24 hr tablet Take 1,000 mg by mouth 2 (two) times daily.   09/17/2018  . methadone (DOLOPHINE) 10 MG tablet Take 2 tablets (20 mg total) by mouth every 12 (twelve) hours. (Patient taking differently: Take 10 mg by mouth 5 (five) times daily. ) 120 tablet 0 09/17/2018  . pantoprazole (PROTONIX) 40 MG tablet Take 40 mg by mouth daily.   09/17/2018  . potassium chloride SA (K-DUR,KLOR-CON) 20 MEQ tablet Take 2 tablets by mouth 2 (two) times daily.   09/17/2018  . predniSONE (STERAPRED UNI-PAK 21 TAB) 10 MG (21) TBPK tablet Take by package instructions 21 tablet 0 09/17/2018  . prochlorperazine (COMPAZINE) 5 MG tablet Take 1 tablet by mouth every 6 (six) hours as needed for nausea.   0 09/17/2018  . promethazine (PHENERGAN) 25 MG tablet Take 25 mg by mouth daily as needed for nausea or vomiting.   09/17/2018  . ranitidine (ZANTAC) 150 MG tablet Take 150 mg by mouth at bedtime.   09/17/2018  . spironolactone (ALDACTONE) 50 MG tablet Take 2 tablets by mouth daily.   09/17/2018  . sucralfate (CARAFATE) 1 g tablet Take 1 g by mouth 4 (four) times daily as needed (ulcers).    09/17/2018  . Suvorexant (BELSOMRA) 20 MG TABS Take 20 mg by mouth at bedtime.    09/16/2018 at Unknown time  . tetrahydrozoline 0.05 % ophthalmic solution Place 2 drops into both eyes 2 (two) times daily as needed (allergies/dry/itchy).   Past Month at Unknown time  . torsemide (DEMADEX) 20 MG tablet Take 2 tablets by mouth daily.   09/17/2018  . vortioxetine HBr (TRINTELLIX) 20 MG TABS tablet Take 20 mg by mouth  every evening.    09/16/2018 at Unknown time  . zolpidem (AMBIEN) 10 MG tablet Take 1 tablet by mouth at bedtime.   5 09/16/2018 at Unknown time   Scheduled: . cefdinir  300 mg Oral Q12H  . enoxaparin (LOVENOX) injection  40 mg Subcutaneous Q24H  . famotidine  20 mg Oral Daily  . guaiFENesin  1,200 mg Oral BID  . insulin aspart  0-20 Units Subcutaneous TID WC  . insulin aspart  0-5 Units Subcutaneous QHS  . insulin glargine  25 Units Subcutaneous Daily  . lactulose  10 g Oral TID  . loratadine  10 mg Oral Daily  . LORazepam  1 mg Oral Q6H  . methadone  10 mg Oral Q6H  . pantoprazole  40 mg Oral Daily  . potassium chloride SA  40 mEq Oral BID  . predniSONE  40 mg Oral Q breakfast  . sodium chloride flush  3 mL Intravenous Q12H  . spironolactone  100 mg Oral Daily  . torsemide  40 mg Oral Daily  . vortioxetine HBr  20 mg Oral QPM  . zolpidem  5 mg Oral QHS   Continuous: . sodium chloride     LTK:CXWNPI chloride, acetaminophen **OR** acetaminophen, albuterol, dextrose, ondansetron **OR** ondansetron (ZOFRAN) IV, sodium chloride flush, sucralfate  Assesment: She was admitted with uncontrolled diabetes.  This is better.  She has COPD exacerbation which is improving  She has morbid obesity unchanged  She has cirrhosis of the liver stable  She has hepatitis C and she is being set up for treatment at [facility]  She has severe anxiety and depression I have changed her medications   Active Problems:   Hyperglycemia   Uncontrolled diabetes mellitus ([place])    Plan: I think she is okay for discharge.  We will set up home health.  We will continue with everything else.  High risk for return    LOS: 2 days   Jackolyn Geron L 09/21/2018, 9:15 AM

## 2018-09-23 ENCOUNTER — Emergency Department (HOSPITAL_COMMUNITY): Payer: Medicaid Other

## 2018-09-23 ENCOUNTER — Emergency Department (HOSPITAL_COMMUNITY)
Admission: EM | Admit: 2018-09-23 | Discharge: 2018-09-23 | Disposition: A | Discharge status: Home / Self Care | Payer: Medicaid Other | Attending: Emergency Medicine | Admitting: Emergency Medicine

## 2018-09-23 ENCOUNTER — Other Ambulatory Visit: Payer: Self-pay

## 2018-09-23 ENCOUNTER — Encounter (HOSPITAL_COMMUNITY): Payer: Self-pay | Admitting: Emergency Medicine

## 2018-09-23 DIAGNOSIS — Z79899 Other long term (current) drug therapy: Secondary | ICD-10-CM | POA: Diagnosis not present

## 2018-09-23 DIAGNOSIS — E119 Type 2 diabetes mellitus without complications: Secondary | ICD-10-CM | POA: Insufficient documentation

## 2018-09-23 DIAGNOSIS — Z87891 Personal history of nicotine dependence: Secondary | ICD-10-CM | POA: Insufficient documentation

## 2018-09-23 DIAGNOSIS — Z794 Long term (current) use of insulin: Secondary | ICD-10-CM | POA: Insufficient documentation

## 2018-09-23 DIAGNOSIS — R1084 Generalized abdominal pain: Secondary | ICD-10-CM | POA: Insufficient documentation

## 2018-09-23 DIAGNOSIS — J449 Chronic obstructive pulmonary disease, unspecified: Secondary | ICD-10-CM | POA: Diagnosis not present

## 2018-09-23 DIAGNOSIS — R112 Nausea with vomiting, unspecified: Secondary | ICD-10-CM | POA: Diagnosis not present

## 2018-09-23 DIAGNOSIS — G8929 Other chronic pain: Secondary | ICD-10-CM | POA: Diagnosis not present

## 2018-09-23 DIAGNOSIS — R109 Unspecified abdominal pain: Secondary | ICD-10-CM

## 2018-09-23 HISTORY — DX: Chronic obstructive pulmonary disease, unspecified: J44.9

## 2018-09-23 LAB — CBC
HEMATOCRIT: 39.4 % (ref 36.0–46.0)
Hemoglobin: 12.4 g/dL (ref 12.0–15.0)
MCH: 28.4 pg (ref 26.0–34.0)
MCHC: 31.5 g/dL (ref 30.0–36.0)
MCV: 90.4 fL (ref 80.0–100.0)
PLATELETS: 78 10*3/uL — AB (ref 150–400)
RBC: 4.36 MIL/uL (ref 3.87–5.11)
RDW: 13.7 % (ref 11.5–15.5)
WBC: 5.6 10*3/uL (ref 4.0–10.5)
nRBC: 0 % (ref 0.0–0.2)

## 2018-09-23 LAB — URINALYSIS, ROUTINE W REFLEX MICROSCOPIC
BILIRUBIN URINE: NEGATIVE
GLUCOSE, UA: NEGATIVE mg/dL
Hgb urine dipstick: NEGATIVE
KETONES UR: NEGATIVE mg/dL
Leukocytes, UA: NEGATIVE
Nitrite: NEGATIVE
PH: 6 (ref 5.0–8.0)
PROTEIN: NEGATIVE mg/dL
Specific Gravity, Urine: 1.021 (ref 1.005–1.030)

## 2018-09-23 LAB — COMPREHENSIVE METABOLIC PANEL
ALK PHOS: 79 U/L (ref 38–126)
ALT: 50 U/L — AB (ref 0–44)
AST: 36 U/L (ref 15–41)
Albumin: 3.5 g/dL (ref 3.5–5.0)
Anion gap: 9 (ref 5–15)
BUN: 9 mg/dL (ref 6–20)
CALCIUM: 8.6 mg/dL — AB (ref 8.9–10.3)
CHLORIDE: 102 mmol/L (ref 98–111)
CO2: 23 mmol/L (ref 22–32)
CREATININE: 0.61 mg/dL (ref 0.44–1.00)
GFR calc Af Amer: >60 mL/min (ref >60)
GFR calc non Af Amer: >60 mL/min (ref >60)
Glucose, Bld: 151 mg/dL — ABNORMAL HIGH (ref 70–99)
Potassium: 3.8 mmol/L (ref 3.5–5.1)
Sodium: 134 mmol/L — ABNORMAL LOW (ref 135–145)
Total Bilirubin: 1.2 mg/dL (ref 0.3–1.2)
Total Protein: 6.9 g/dL (ref 6.5–8.1)

## 2018-09-23 LAB — HCG, QUANTITATIVE, PREGNANCY: hCG, Beta Chain, Quant, S: <1 m[IU]/mL (ref <5)

## 2018-09-23 LAB — LIPASE, BLOOD: LIPASE: 23 U/L (ref 11–51)

## 2018-09-23 LAB — CBG MONITORING, ED: Glucose-Capillary: 147 mg/dL — ABNORMAL HIGH (ref 70–99)

## 2018-09-23 MED ORDER — ONDANSETRON HCL 4 MG/2ML IJ SOLN
4.0000 mg | INTRAMUSCULAR | Status: DC | PRN
  Administered 2018-09-23: 4 mg via INTRAVENOUS
  Filled 2018-09-23: qty 2

## 2018-09-23 MED ORDER — FAMOTIDINE IN NACL 20-0.9 MG/50ML-% IV SOLN
20.0000 mg | Freq: Once | INTRAVENOUS | Status: AC
  Administered 2018-09-23: 20 mg via INTRAVENOUS
  Filled 2018-09-23: qty 50

## 2018-09-23 MED ORDER — ONDANSETRON 4 MG PO TBDP: 4.0000 mg | ORAL_TABLET | Freq: Three times a day (TID) | ORAL | 0 refills | Status: DC | PRN

## 2018-09-23 MED ORDER — SODIUM CHLORIDE 0.9 % IV BOLUS
1000.0000 mL | Freq: Once | INTRAVENOUS | Status: AC
  Administered 2018-09-23: 1000 mL via INTRAVENOUS

## 2018-09-23 MED ORDER — DICYCLOMINE HCL 10 MG/ML IM SOLN
20.0000 mg | Freq: Once | INTRAMUSCULAR | Status: AC
  Administered 2018-09-23: 20 mg via INTRAMUSCULAR
  Filled 2018-09-23: qty 2

## 2018-09-23 NOTE — ED Provider Notes (Signed)
[address] [facility] EMERGENCY DEPARTMENT Provider Note   CSN: [id number] Arrival date & time: 09/23/18  1755     History   Chief Complaint Chief Complaint  Patient presents with  . Emesis    HPI Jaime [name] is a 32 y.o. female.  HPI  Pt was seen at [place].  Per pt, c/o gradual onset and persistence of constant generalized abd "pain" and one episode of N/V that began PTA. Describes the abd pain as "aching."  Denies diarrhea, no fevers, no back pain, no rash, no CP/SOB, no black or blood in stools or emesis.      Past Medical History:  Diagnosis Date  . Anxiety   . Chronic abdominal pain   . Chronic back pain   . Cirrhosis ([place])   . COPD (chronic obstructive pulmonary disease) ([place])   . Depression   . Diabetes mellitus without complication ([place])   . Hepatitis C   . Insomnia   . Long-term current use of methadone for opiate dependence ([place])   . Migraine headache   . Peptic ulcer     Patient Active Problem List   Diagnosis Date Noted  . Uncontrolled diabetes mellitus ([place]) 09/19/2018  . Hyperglycemia 09/17/2018  . Acute respiratory failure with hypoxia ([place]) 09/15/2018  . Anxiety 09/15/2018  . Chronic abdominal pain 09/15/2018  . Diabetes mellitus without complication ([place]) 92/33/0076  . Hepatitis C 09/15/2018  . Peptic ulcer 09/15/2018  . Long-term current use of methadone for opiate dependence ([place]) 09/15/2018  . Cirrhosis ([place]) 09/15/2018  . CAP (community acquired pneumonia) 07/17/2016  . Acute hypoxemic respiratory failure ([place]) 07/17/2016  . Pleural effusion on right 07/17/2016  . Multiple rib fractures involving four or more ribs 07/17/2016  . Hemothorax on right 07/17/2016  . Chronic pain disorder 07/17/2016    Past Surgical History:  Procedure Laterality Date  . CHOLECYSTECTOMY       OB History    Gravida      Para      Term      Preterm      AB      Living  0     SAB      TAB      Ectopic      Multiple      Live Births               Home Medications    Prior to Admission medications   Medication Sig Start Date End Date Taking? Authorizing Provider  albuterol (PROVENTIL) (2.5 MG/3ML) 0.083% nebulizer solution Take 2.5 mg by nebulization every 6 (six) hours as needed for wheezing or shortness of breath.    [provider]  butorphanol (STADOL) 10 MG/ML nasal spray Place 1 spray into the nose every 4 (four) hours as needed (breakthrough pain).    [provider]  cefdinir (OMNICEF) 300 MG capsule Take 1 capsule (300 mg total) by mouth every 12 (twelve) hours. 09/17/18   Sinda Du, MD  cetirizine (ZYRTEC) 10 MG tablet Take 10 mg by mouth every evening.    [provider]  diclofenac sodium (VOLTAREN) 1 % GEL Apply 2 g topically 4 (four) times daily as needed (pain).  08/13/18   [provider]  etonogestrel (NEXPLANON) 68 MG IMPL implant 1 each by Subdermal route once.    [provider]  insulin aspart (NOVOLOG) 100 UNIT/ML injection Inject 0-20 Units into the skin 3 (three) times daily with meals. 09/21/18   Sinda Du, MD  insulin aspart (NOVOLOG) 100 UNIT/ML injection Inject 0-5 Units into the skin at bedtime. 09/21/18   Sinda Du, MD  insulin NPH-regular Human (70-30) 100 UNIT/ML injection Inject 20 Units into the skin 2 (two) times daily with a meal. 08/24/18   [provider]  lactulose (CHRONULAC) 10 GM/15ML solution Take 15 mLs by mouth 3 (three) times daily. 08/24/18   [provider]  LORazepam (ATIVAN) 1 MG tablet Take 1 tablet (1 mg total) by mouth every 6 (six) hours. 09/21/18   Sinda Du, MD  metFORMIN (GLUCOPHAGE-XR) 500 MG 24 hr tablet Take 1,000 mg by mouth 2 (two) times daily.    [provider]  methadone (DOLOPHINE) 10 MG tablet Take 2 tablets (20 mg total) by mouth every 12 (twelve) hours. Patient taking differently: Take 10 mg by mouth 5 (five) times daily.  07/20/16   Sinda Du, MD  pantoprazole  (PROTONIX) 40 MG tablet Take 40 mg by mouth daily.    [provider]  potassium chloride SA (K-DUR,KLOR-CON) 20 MEQ tablet Take 2 tablets by mouth 2 (two) times daily. 07/28/18   [provider]  predniSONE (STERAPRED UNI-PAK 21 TAB) 10 MG (21) TBPK tablet Take by package instructions 09/17/18   Sinda Du, MD  prochlorperazine (COMPAZINE) 5 MG tablet Take 1 tablet by mouth every 6 (six) hours as needed for nausea.  07/11/18   [provider]  promethazine (PHENERGAN) 25 MG tablet Take 25 mg by mouth daily as needed for nausea or vomiting.    [provider]  ranitidine (ZANTAC) 150 MG tablet Take 150 mg by mouth at bedtime.    [provider]  spironolactone (ALDACTONE) 50 MG tablet Take 2 tablets by mouth daily. 09/09/18   [provider]  sucralfate (CARAFATE) 1 g tablet Take 1 g by mouth 4 (four) times daily as needed (ulcers).     [provider]  Suvorexant (BELSOMRA) 20 MG TABS Take 20 mg by mouth at bedtime.     [provider]  tetrahydrozoline 0.05 % ophthalmic solution Place 2 drops into both eyes 2 (two) times daily as needed (allergies/dry/itchy).    [provider]  torsemide (DEMADEX) 20 MG tablet Take 2 tablets by mouth daily.    [provider]  vortioxetine HBr (TRINTELLIX) 20 MG TABS tablet Take 20 mg by mouth every evening.     [provider]  zolpidem (AMBIEN) 10 MG tablet Take 1 tablet by mouth at bedtime.  03/14/18   [provider]    Family History Family History  Problem Relation Age of Onset  . Hypertension Mother   . Hyperlipidemia Other     Social History Social History   Tobacco Use  . Smoking status: Former Smoker    Packs/day: 0.00    Years: 5.00    Pack years: 0.00  . Smokeless tobacco: Never Used  Substance Use Topics  . Alcohol use: No  . Drug use: No     Allergies   Iodine-131; Ketorolac; Ketorolac tromethamine; Ivp dye [iodinated  diagnostic agents]; Nsaids; Toradol [ketorolac tromethamine]; and Vancomycin   Review of Systems Review of Systems ROS: Statement: All systems negative except as marked or noted in the HPI; Constitutional: Negative for fever and chills. ; ; Eyes: Negative for eye pain, redness and discharge. ; ; ENMT: Negative for ear pain, hoarseness, nasal congestion, sinus pressure and sore throat. ; ; Cardiovascular: Negative for chest pain, palpitations, diaphoresis, dyspnea and peripheral edema. ; ; Respiratory: Negative  for cough, wheezing and stridor. ; ; Gastrointestinal: +N/V, abd pain. Negative for diarrhea, blood in stool, hematemesis, jaundice and rectal bleeding. . ; ; Genitourinary: Negative for dysuria, flank pain and hematuria. ; ; Musculoskeletal: Negative for back pain and neck pain. Negative for swelling and trauma.; ; Skin: Negative for pruritus, rash, abrasions, blisters, bruising and skin lesion.; ; Neuro: Negative for headache, lightheadedness and neck stiffness. Negative for weakness, altered level of consciousness, altered mental status, extremity weakness, paresthesias, involuntary movement, seizure and syncope.       Physical Exam Updated Vital Signs BP (!) 144/85 (BP Location: Right Arm)   Pulse 94   Temp 98.4 F (36.9 C) (Oral)   Resp 19   Ht 5' 10"  (1.778 m)   Wt 124.3 kg   SpO2 95%   BMI 39.31 kg/m   Physical Exam 1945: Physical examination:  Nursing notes reviewed; Vital signs and O2 SAT reviewed;  Constitutional: Well developed, Well nourished, Well hydrated, In no acute distress; Head:  Normocephalic, atraumatic; Eyes: EOMI, PERRL, No scleral icterus; ENMT: Mouth and pharynx normal, Mucous membranes moist; Neck: Supple, Full range of motion, No lymphadenopathy; Cardiovascular: Regular rate and rhythm, No gallop; Respiratory: Breath sounds clear & equal bilaterally, No wheezes.  Speaking full sentences with ease, Normal respiratory effort/excursion; Chest: Nontender,  Movement normal; Abdomen: Soft, +diffuse tenderness to palp. No rebound or guarding. Nondistended, Normal bowel sounds; Genitourinary: No CVA tenderness; Extremities: Peripheral pulses normal, No tenderness, No edema, No calf edema or asymmetry.; Neuro: AA&Ox3, Major CN grossly intact.  Speech clear. No gross focal motor or sensory deficits in extremities.; Skin: Color normal, Warm, Dry.; Psych:  Affect flat.    ED Treatments / Results  Labs (all labs ordered are listed, but only abnormal results are displayed)   EKG None  Radiology   Procedures Procedures (including critical care time)  Medications Ordered in ED Medications  sodium chloride 0.9 % bolus 1,000 mL (1,000 mLs Intravenous New Bag/Given 09/23/18 2025)  ondansetron (ZOFRAN) injection 4 mg (4 mg Intravenous Given 09/23/18 2028)  famotidine (PEPCID) IVPB 20 mg premix (20 mg Intravenous New Bag/Given 09/23/18 2028)     Initial Impression / Assessment and Plan / ED Course  I have reviewed the triage vital signs and the nursing notes.  Pertinent labs & imaging results that were available during my care of the patient were reviewed by me and considered in my medical decision making (see chart for details).  MDM Reviewed: previous chart, nursing note and vitals Reviewed previous: labs Interpretation: labs, x-ray and CT scan    Results for orders placed or performed during the hospital encounter of 09/23/18  Lipase, blood  Result Value Ref [place]   Lipase 23 11 - 51 U/L  Comprehensive metabolic panel  Result Value Ref [place]   Sodium 134 (L) 135 - 145 mmol/L   Potassium 3.8 3.5 - 5.1 mmol/L   Chloride 102 98 - 111 mmol/L   CO2 23 22 - 32 mmol/L   Glucose, Bld 151 (H) 70 - 99 mg/dL   BUN 9 6 - 20 mg/dL   Creatinine, Ser 0.61 0.44 - 1.00 mg/dL   Calcium 8.6 (L) 8.9 - 10.3 mg/dL   Total Protein 6.9 6.5 - 8.1 g/dL   Albumin 3.5 3.5 - 5.0 g/dL   AST 36 15 - 41 U/L   ALT 50 (H) 0 - 44 U/L   Alkaline Phosphatase 79  38 - 126 U/L   Total Bilirubin 1.2 0.3 - 1.2  mg/dL   GFR calc non Af Amer >60 >60 mL/min   GFR calc Af Amer >60 >60 mL/min   Anion gap 9 5 - 15  CBC  Result Value Ref [place]   WBC 5.6 4.0 - 10.5 K/uL   RBC 4.36 3.87 - 5.11 MIL/uL   Hemoglobin 12.4 12.0 - 15.0 g/dL   HCT 39.4 36.0 - 46.0 %   MCV 90.4 80.0 - 100.0 fL   MCH 28.4 26.0 - 34.0 pg   MCHC 31.5 30.0 - 36.0 g/dL   RDW 13.7 11.5 - 15.5 %   Platelets 78 (L) 150 - 400 K/uL   nRBC 0.0 0.0 - 0.2 %  Urinalysis, Routine w reflex microscopic  Result Value Ref [place]   Color, Urine YELLOW YELLOW   APPearance HAZY (A) CLEAR   Specific Gravity, Urine 1.021 1.005 - 1.030   pH 6.0 5.0 - 8.0   Glucose, UA NEGATIVE NEGATIVE mg/dL   Hgb urine dipstick NEGATIVE NEGATIVE   Bilirubin Urine NEGATIVE NEGATIVE   Ketones, ur NEGATIVE NEGATIVE mg/dL   Protein, ur NEGATIVE NEGATIVE mg/dL   Nitrite NEGATIVE NEGATIVE   Leukocytes, UA NEGATIVE NEGATIVE  hCG, quantitative, pregnancy  Result Value Ref [place]   hCG, Beta Chain, Quant, S <1 <5 mIU/mL  CBG monitoring, ED  Result Value Ref [place]   Glucose-Capillary 147 (H) 70 - 99 mg/dL   Ct Abdomen Pelvis Wo Contrast Result Date: 09/23/2018 CLINICAL DATA:  Abdominal and back pain. EXAM: CT ABDOMEN AND PELVIS WITHOUT CONTRAST TECHNIQUE: Multidetector CT imaging of the abdomen and pelvis was performed following the standard protocol without IV contrast. COMPARISON:  CT abdomen dated 07/23/2018. FINDINGS: Lower chest: Stable scarring/atelectasis at the LEFT lung base. Lung bases are now otherwise clear. Hepatobiliary: Cirrhotic appearing liver. No focal liver abnormality is seen. Status post cholecystectomy. No bile duct dilatation seen. Pancreas: Significantly infiltrated with fat but otherwise unremarkable. Spleen: Splenomegaly. No focal abnormality. Adrenals/Urinary Tract: Adrenal glands appear normal. Kidneys are unremarkable without mass, stone or hydronephrosis. No ureteral or bladder calculi  appreciated. Bladder is decompressed. Stomach/Bowel: No dilated large or small bowel loops. No evidence of focal bowel wall thickening or bowel wall inflammation. Stomach is unremarkable, partially decompressed. Vascular/Lymphatic: No acute appearing vascular abnormality. No enlarged lymph nodes seen. Portal and splenic veins are distended, indicating chronic portal hypertension. Associated upper abdominal varices. Reproductive: Uterus and bilateral adnexa are unremarkable. Other: Stable mild fluid stranding within the abdomen and pelvis. No focal fluid collection or significant amount of free fluid. Musculoskeletal: No acute or suspicious osseous finding. IMPRESSION: 1. No acute findings within the abdomen or pelvis. No bowel obstruction or evidence of bowel wall inflammation. No renal or ureteral calculi. No evidence of acute solid organ abnormality. 2. Cirrhotic liver with associated splenomegaly and upper abdominal varices. 3. Stable mild fluid stranding (ascites) within the abdomen and pelvis. Electronically Signed   By: Franki Cabot M.D.   On: 09/23/2018 20:25   Dg Chest 2 View Result Date: 09/23/2018 CLINICAL DATA:  Abdominal pain, nausea, vomiting. EXAM: CHEST - 2 VIEW COMPARISON:  Chest x-ray dated 09/17/2018. FINDINGS: The heart size and mediastinal contours are within normal limits. Both lungs are clear. The visualized skeletal structures are unremarkable. IMPRESSION: No active cardiopulmonary disease. Electronically Signed   By: Franki Cabot M.D.   On: 09/23/2018 20:26    2115:  Workup reassuring. Pt has tol PO well while in the ED without N/V.  No stooling while in the ED.  Abd benign,  VSS. Pt has climbed off the stretcher and is sitting in a chair in the room. Requesting "pain medicine." Pt on multiple narcotic medications chronically (methadone, butorphanol); will not dose nor prescribe further narcotics for chronic abd pain. Pt states she is read to go home now. Dx and testing d/w pt.   Questions answered.  Verb understanding, agreeable to d/c home with outpt f/u.      Final Clinical Impressions(s) / ED Diagnoses   Final diagnoses:  None    ED Discharge Orders    None       Francine Graven, DO 09/25/18 2134

## 2018-09-23 NOTE — Discharge Instructions (Addendum)
Take your usual prescriptions as previously directed.  Increase your fluid intake (ie:  Gatoraide) for the next few days.  Eat a bland diet and advance to your regular diet slowly as you can tolerate it.  Call your regular medical doctor and your GI doctor on Monday to schedule a follow up appointment next week.  Return to the Emergency Department sooner if worsening.

## 2018-09-23 NOTE — ED Triage Notes (Signed)
Patient complaining of abdominal pain and vomiting 15 minutes prior to arrival.

## 2018-10-02 ENCOUNTER — Emergency Department (HOSPITAL_COMMUNITY)
Admission: EM | Admit: 2018-10-02 | Discharge: 2018-10-02 | Disposition: A | Discharge status: Home / Self Care | Payer: Medicaid Other | Attending: Emergency Medicine | Admitting: Emergency Medicine

## 2018-10-02 ENCOUNTER — Encounter (HOSPITAL_COMMUNITY): Payer: Self-pay | Admitting: Emergency Medicine

## 2018-10-02 DIAGNOSIS — K7460 Unspecified cirrhosis of liver: Secondary | ICD-10-CM | POA: Diagnosis not present

## 2018-10-02 DIAGNOSIS — N390 Urinary tract infection, site not specified: Secondary | ICD-10-CM | POA: Diagnosis not present

## 2018-10-02 DIAGNOSIS — R109 Unspecified abdominal pain: Secondary | ICD-10-CM | POA: Diagnosis present

## 2018-10-02 DIAGNOSIS — Z794 Long term (current) use of insulin: Secondary | ICD-10-CM | POA: Insufficient documentation

## 2018-10-02 DIAGNOSIS — E119 Type 2 diabetes mellitus without complications: Secondary | ICD-10-CM | POA: Insufficient documentation

## 2018-10-02 DIAGNOSIS — Z79899 Other long term (current) drug therapy: Secondary | ICD-10-CM | POA: Diagnosis not present

## 2018-10-02 DIAGNOSIS — Z87891 Personal history of nicotine dependence: Secondary | ICD-10-CM | POA: Diagnosis not present

## 2018-10-02 LAB — COMPREHENSIVE METABOLIC PANEL
ALT: 43 U/L (ref 0–44)
ANION GAP: 8 (ref 5–15)
AST: 31 U/L (ref 15–41)
Albumin: 3.4 g/dL — ABNORMAL LOW (ref 3.5–5.0)
Alkaline Phosphatase: 94 U/L (ref 38–126)
BILIRUBIN TOTAL: 0.7 mg/dL (ref 0.3–1.2)
BUN: 5 mg/dL — AB (ref 6–20)
CHLORIDE: 102 mmol/L (ref 98–111)
CO2: 25 mmol/L (ref 22–32)
Calcium: 8.8 mg/dL — ABNORMAL LOW (ref 8.9–10.3)
Creatinine, Ser: 0.52 mg/dL (ref 0.44–1.00)
GFR calc Af Amer: >60 mL/min (ref >60)
GLUCOSE: 354 mg/dL — AB (ref 70–99)
POTASSIUM: 4.1 mmol/L (ref 3.5–5.1)
Sodium: 135 mmol/L (ref 135–145)
Total Protein: 7 g/dL (ref 6.5–8.1)

## 2018-10-02 LAB — URINALYSIS, ROUTINE W REFLEX MICROSCOPIC
BILIRUBIN URINE: NEGATIVE
Bacteria, UA: NONE SEEN
HGB URINE DIPSTICK: NEGATIVE
Ketones, ur: NEGATIVE mg/dL
LEUKOCYTES UA: NEGATIVE
NITRITE: NEGATIVE
Protein, ur: NEGATIVE mg/dL
Specific Gravity, Urine: 1.031 — ABNORMAL HIGH (ref 1.005–1.030)
pH: 7 (ref 5.0–8.0)

## 2018-10-02 LAB — CBC WITH DIFFERENTIAL/PLATELET
Abs Immature Granulocytes: 0.01 10*3/uL (ref 0.00–0.07)
BASOS ABS: 0 10*3/uL (ref 0.0–0.1)
BASOS PCT: 0 %
EOS ABS: 0.1 10*3/uL (ref 0.0–0.5)
EOS PCT: 2 %
HEMATOCRIT: 37 % (ref 36.0–46.0)
Hemoglobin: 11.4 g/dL — ABNORMAL LOW (ref 12.0–15.0)
Immature Granulocytes: 0 %
LYMPHS ABS: 0.7 10*3/uL (ref 0.7–4.0)
Lymphocytes Relative: 19 %
MCH: 28.4 pg (ref 26.0–34.0)
MCHC: 30.8 g/dL (ref 30.0–36.0)
MCV: 92.3 fL (ref 80.0–100.0)
MONOS PCT: 8 %
Monocytes Absolute: 0.3 10*3/uL (ref 0.1–1.0)
NRBC: 0 % (ref 0.0–0.2)
Neutro Abs: 2.8 10*3/uL (ref 1.7–7.7)
Neutrophils Relative %: 71 %
PLATELETS: 59 10*3/uL — AB (ref 150–400)
RBC: 4.01 MIL/uL (ref 3.87–5.11)
RDW: 14.2 % (ref 11.5–15.5)
WBC: 4 10*3/uL (ref 4.0–10.5)

## 2018-10-02 LAB — I-STAT BETA HCG BLOOD, ED (MC, WL, AP ONLY): I-stat hCG, quantitative: <5 m[IU]/mL (ref <5)

## 2018-10-02 LAB — LIPASE, BLOOD: LIPASE: 27 U/L (ref 11–51)

## 2018-10-02 LAB — CBG MONITORING, ED
GLUCOSE-CAPILLARY: 360 mg/dL — AB (ref 70–99)
Glucose-Capillary: 287 mg/dL — ABNORMAL HIGH (ref 70–99)

## 2018-10-02 MED ORDER — ONDANSETRON HCL 4 MG/2ML IJ SOLN
4.0000 mg | Freq: Once | INTRAMUSCULAR | Status: AC
  Administered 2018-10-02: 4 mg via INTRAVENOUS
  Filled 2018-10-02: qty 2

## 2018-10-02 MED ORDER — ONDANSETRON 4 MG PO TBDP: 4.0000 mg | ORAL_TABLET | Freq: Three times a day (TID) | ORAL | 0 refills | Status: DC | PRN

## 2018-10-02 MED ORDER — CEPHALEXIN 500 MG PO CAPS
500.0000 mg | ORAL_CAPSULE | Freq: Once | ORAL | Status: AC
  Administered 2018-10-02: 500 mg via ORAL
  Filled 2018-10-02: qty 1

## 2018-10-02 MED ORDER — SODIUM CHLORIDE 0.9 % IV BOLUS
1000.0000 mL | Freq: Once | INTRAVENOUS | Status: AC
  Administered 2018-10-02: 1000 mL via INTRAVENOUS

## 2018-10-02 MED ORDER — ACETAMINOPHEN 325 MG PO TABS
650.0000 mg | ORAL_TABLET | Freq: Once | ORAL | Status: DC
  Filled 2018-10-02: qty 2

## 2018-10-02 NOTE — Discharge Instructions (Addendum)
You have been diagnosed today with urinary tract infection and abdominal pain.  At this time there does not appear to be the presence of an emergent medical condition, however there is always the potential for conditions to change. Please read and follow the below instructions.  Please return to the Emergency Department immediately for any new or worsening symptoms or if your symptoms do not improve within 48 hours. Please be sure to follow up with your Primary Care Provider within 3 days regarding your visit today; please call their office to schedule an appointment even if you are feeling better for a follow-up visit. Please continue to take the antibiotic medication Keflex as previously prescribed for your urinary tract infection. You may use the Zofran as prescribed for nausea/vomiting. Please be sure to drink plenty of water and get plenty of rest over the next few days to help with your symptoms.  Please use your insulin as prescribed by your primary care provider and closely watch your blood glucose levels.  Infections such as urinary tract infections can elevate your blood glucose levels.  Get help right away if: You have severe back pain or lower abdominal pain. You are vomiting and cannot keep down any medicines or water. Your pain does not go away as soon as your health care provider told you to expect. You cannot stop throwing up. Your pain is only in areas of the abdomen, such as the right side or the left lower portion of the abdomen. You have bloody or black stools, or stools that look like tar. You have severe pain, cramping, or bloating in your abdomen. You have signs of dehydration, such as: Dark urine, very little urine, or no urine. Cracked lips. Dry mouth. Sunken eyes. Sleepiness. Weakness.  Please read the additional information packets attached to your discharge summary.  Do not take your medicine if  develop an itchy rash, swelling in your mouth or lips, or  difficulty breathing.

## 2018-10-02 NOTE — ED Provider Notes (Signed)
[place] Provider Note   CSN: [id number] Arrival date & time: 10/02/18  0750     History   Chief Complaint Chief Complaint  Patient presents with  . Abdominal Pain    HPI NONNIE PICKNEY is a 32 y.o. female with history of cirrhosis, diabetes, chronic abdominal pain and peptic ulcer presenting for 3 days of abdominal pain, nausea and vomiting.  Additionally patient endorses dysuria.  Patient endorses a gradual onset of her pain, severe in intensity umbilical throbbing pain that is constant worsened with vomiting.  Patient was seen at [facility] yesterday afternoon where she was diagnosed with a urinary tract infection, prescribed Keflex 500 twice daily and discharged.  Patient states that she had 1 dose of Keflex yesterday at 4 PM and has not taken her second dose yet.  Patient states that her pain, nausea and vomiting have continued to this time which is what she is concerned today.  Additionally patient is concerned that her blood glucose level is elevated, reports a reading of greater than 300 at home today.  She is on insulin for which she uses a sliding scale but states that her blood sugars remained elevated despite insulin use.  Patient denies fever, chest pain, shortness of breath, diarrhea, change to stool color, headache, confusion or other concerning symptoms at this time.  HPI  Past Medical History:  Diagnosis Date  . Anxiety   . Chronic abdominal pain   . Chronic back pain   . Cirrhosis ([place])   . COPD (chronic obstructive pulmonary disease) ([place])   . Depression   . Diabetes mellitus without complication ([place])   . Hepatitis C   . Insomnia   . Long-term current use of methadone for opiate dependence ([place])   . Migraine headache   . Peptic ulcer     Patient Active Problem List   Diagnosis Date Noted  . Uncontrolled diabetes mellitus ([place]) 09/19/2018  . Hyperglycemia 09/17/2018  . Acute respiratory failure with hypoxia ([place])  09/15/2018  . Anxiety 09/15/2018  . Chronic abdominal pain 09/15/2018  . Diabetes mellitus without complication ([place]) 24/40/1027  . Hepatitis C 09/15/2018  . Peptic ulcer 09/15/2018  . Long-term current use of methadone for opiate dependence ([place]) 09/15/2018  . Cirrhosis ([place]) 09/15/2018  . CAP (community acquired pneumonia) 07/17/2016  . Acute hypoxemic respiratory failure ([place]) 07/17/2016  . Pleural effusion on right 07/17/2016  . Multiple rib fractures involving four or more ribs 07/17/2016  . Hemothorax on right 07/17/2016  . Chronic pain disorder 07/17/2016    Past Surgical History:  Procedure Laterality Date  . CHOLECYSTECTOMY       OB History    Gravida      Para      Term      Preterm      AB      Living  0     SAB      TAB      Ectopic      Multiple      Live Births               Home Medications    Prior to Admission medications   Medication Sig Start Date End Date Taking? Authorizing Provider  albuterol (PROVENTIL) (2.5 MG/3ML) 0.083% nebulizer solution Take 2.5 mg by nebulization every 6 (six) hours as needed for wheezing or shortness of breath.   Yes [provider]  cephALEXin (KEFLEX) 500 MG capsule Take 500 mg by mouth  2 (two) times daily.  10/01/18 10/08/18 Yes [provider]  cetirizine (ZYRTEC) 10 MG tablet Take 10 mg by mouth every evening.   Yes [provider]  diclofenac sodium (VOLTAREN) 1 % GEL Apply 2 g topically 4 (four) times daily as needed (pain).  08/13/18  Yes [provider]  insulin aspart (NOVOLOG) 100 UNIT/ML injection Inject 0-20 Units into the skin 3 (three) times daily with meals. 09/21/18  Yes Sinda Du, MD  insulin aspart (NOVOLOG) 100 UNIT/ML injection Inject 0-5 Units into the skin at bedtime. 09/21/18  Yes Sinda Du, MD  insulin NPH-regular Human (70-30) 100 UNIT/ML injection Inject 20 Units into the skin 2 (two) times daily with a meal. 08/24/18  Yes [provider]  lactulose (CHRONULAC) 10 GM/15ML solution Take 15 mLs by mouth 3 (three) times daily. 08/24/18  Yes [provider]  LORazepam (ATIVAN) 1 MG tablet Take 1 tablet (1 mg total) by mouth every 6 (six) hours. 09/21/18  Yes Sinda Du, MD  Menthol (BIOFREEZE) 10 % AERO Apply 1 application topically as needed (pain).   Yes [provider]  metFORMIN (GLUCOPHAGE-XR) 500 MG 24 hr tablet Take 1,000 mg by mouth 2 (two) times daily.   Yes [provider]  methadone (DOLOPHINE) 10 MG tablet Take 2 tablets (20 mg total) by mouth every 12 (twelve) hours. Patient taking differently: Take 10 mg by mouth 5 (five) times daily.  07/20/16  Yes Sinda Du, MD  ondansetron (ZOFRAN ODT) 4 MG disintegrating tablet Take 1 tablet (4 mg total) by mouth every 8 (eight) hours as needed for nausea or vomiting. 09/23/18  Yes Francine Graven, DO  pantoprazole (PROTONIX) 40 MG tablet Take 40 mg by mouth daily.   Yes [provider]  potassium chloride SA (K-DUR,KLOR-CON) 20 MEQ tablet Take 2 tablets by mouth 2 (two) times daily. 07/28/18  Yes [provider]  prochlorperazine (COMPAZINE) 5 MG tablet Take 1 tablet by mouth every 6 (six) hours as needed for nausea.  07/11/18  Yes [provider]  promethazine (PHENERGAN) 25 MG tablet Take 25 mg by mouth daily as needed for nausea or vomiting.   Yes [provider]  ranitidine (ZANTAC) 150 MG tablet Take 150 mg by mouth at bedtime.   Yes [provider]  spironolactone (ALDACTONE) 50 MG tablet Take 2 tablets by mouth daily. 09/09/18  Yes [provider]  sucralfate (CARAFATE) 1 g tablet Take 1 g by mouth 4 (four) times daily as needed (ulcers).    Yes [provider]  Suvorexant (BELSOMRA) 20 MG TABS Take 20 mg by mouth at bedtime.    Yes [provider]  tetrahydrozoline 0.05 % ophthalmic solution Place 2 drops into both eyes 2 (two) times daily as needed  (allergies/dry/itchy).   Yes [provider]  torsemide (DEMADEX) 20 MG tablet Take 2 tablets by mouth daily.   Yes [provider]  vortioxetine HBr (TRINTELLIX) 20 MG TABS tablet Take 20 mg by mouth every evening.    Yes [provider]  zolpidem (AMBIEN) 10 MG tablet Take 1 tablet by mouth at bedtime.  03/14/18  Yes [provider]  cefdinir (OMNICEF) 300 MG capsule Take 1 capsule (300 mg total) by mouth every 12 (twelve) hours. Patient not taking: Reported on 10/02/2018 09/17/18   Sinda Du, MD  etonogestrel (NEXPLANON) 68 MG IMPL implant 1 each by Subdermal route once.    [provider]    Family History Family History  Problem Relation Age of Onset  . Hypertension Mother   . Hyperlipidemia Other     Social History Social History   Tobacco Use  . Smoking status: Former Smoker    Packs/day: 0.00    Years: 5.00    Pack years: 0.00  . Smokeless tobacco: Never Used  Substance Use Topics  . Alcohol use: No  . Drug use: No     Allergies   Iodine-131; Ketorolac; Ketorolac tromethamine; Ivp dye [iodinated diagnostic agents]; Nsaids; Toradol [ketorolac tromethamine]; and Vancomycin   Review of Systems Review of Systems  Constitutional: Negative.  Negative for chills and fever.  HENT: Negative.  Negative for rhinorrhea and sore throat.   Eyes: Negative.  Negative for visual disturbance.  Respiratory: Negative.  Negative for cough and shortness of breath.   Cardiovascular: Negative.  Negative for chest pain.  Gastrointestinal: Positive for abdominal pain, nausea and vomiting. Negative for blood in stool and diarrhea.  Genitourinary: Positive for dysuria. Negative for hematuria.  Musculoskeletal: Negative.  Negative for arthralgias and myalgias.  Skin: Negative.  Negative for rash.  Neurological: Negative.  Negative for dizziness, weakness and headaches.   Physical Exam Updated Vital Signs BP 119/72   Pulse 99   Temp 98.9  F (37.2 C) (Oral)   Resp (!) 22   Ht 5' 10"  (1.778 m)   Wt 124 kg   SpO2 94%   BMI 39.22 kg/m   Physical Exam  Constitutional: She appears well-developed and well-nourished. She does not appear ill. No distress.  HENT:  Head: Normocephalic and atraumatic.  Right Ear: External ear normal.  Left Ear: External ear normal.  Nose: Nose normal.  Mouth/Throat: Uvula is midline, oropharynx is clear and moist and mucous membranes are normal.  Eyes: Pupils are equal, round, and reactive to light. Conjunctivae and EOM are normal.  Neck: Trachea normal, normal range of motion, full passive range of motion without pain and phonation normal. Neck supple. No tracheal deviation present.  Cardiovascular: Regular rhythm, normal heart sounds and intact distal pulses. Tachycardia present.  Pulses:      Dorsalis pedis pulses are 2+ on the right side, and 2+ on the left side.       Posterior tibial pulses are 2+ on the right side, and 2+ on the left side.  Pulmonary/Chest: Effort normal and breath sounds normal. No respiratory distress. She has no decreased breath sounds. She has no rhonchi. She exhibits no tenderness.  Abdominal: Soft. Bowel sounds are normal. There is generalized tenderness. There is no rigidity, no rebound, no guarding, no CVA tenderness, no tenderness at McBurney's point and negative Murphy's sign.  Obese abdomen  Musculoskeletal: Normal range of motion.       Right lower leg: Normal.       Left lower leg: Normal.  Feet:  Right Foot:  Protective Sensation: 3 sites tested. 3 sites sensed.  Left Foot:  Protective Sensation: 3 sites tested. 3 sites sensed.  Neurological: She is alert. GCS eye subscore is 4. GCS verbal subscore is 5. GCS motor subscore is 6.  Speech is clear and goal oriented, follows commands Major Cranial nerves without deficit, no facial droop Normal strength in upper and lower extremities bilaterally including dorsiflexion and plantar flexion, strong and equal  grip strength Sensation normal to light touch Moves extremities without ataxia, coordination intact Normal gait  Skin: Skin is warm and dry.  Psychiatric: She has a normal mood and affect. Her behavior is normal.   ED Treatments /  Results  Labs (all labs ordered are listed, but only abnormal results are displayed) Labs Reviewed  CBC WITH DIFFERENTIAL/PLATELET - Abnormal; Notable for the following components:      Result Value   Hemoglobin 11.4 (*)    Platelets 59 (*)    All other components within normal limits  COMPREHENSIVE METABOLIC PANEL - Abnormal; Notable for the following components:   Glucose, Bld 354 (*)    BUN 5 (*)    Calcium 8.8 (*)    Albumin 3.4 (*)    All other components within normal limits  URINALYSIS, ROUTINE W REFLEX MICROSCOPIC - Abnormal; Notable for the following components:   Specific Gravity, Urine 1.031 (*)    Glucose, UA >=500 (*)    All other components within normal limits  CBG MONITORING, ED - Abnormal; Notable for the following components:   Glucose-Capillary 360 (*)    All other components within normal limits  CBG MONITORING, ED - Abnormal; Notable for the following components:   Glucose-Capillary 287 (*)    All other components within normal limits  LIPASE, BLOOD  I-STAT BETA HCG BLOOD, ED ([facility], [facility], [facility] ONLY)  I-STAT BETA HCG BLOOD, ED ([facility], [facility], [facility] ONLY)  CBG MONITORING, ED    EKG None  Radiology No results found.  Procedures Procedures (including critical care time)  Medications Ordered in ED Medications  sodium chloride 0.9 % bolus 1,000 mL (0 mLs Intravenous Stopped 10/02/18 1037)  ondansetron (ZOFRAN) injection 4 mg (4 mg Intravenous Given 10/02/18 0853)  cephALEXin (KEFLEX) capsule 500 mg (500 mg Oral Given 10/02/18 0935)     Initial Impression / Assessment and Plan / ED Course  I have reviewed the triage vital signs and the nursing notes.  Pertinent labs & imaging results that were available during my care of the patient were  reviewed by me and considered in my medical decision making (see chart for details).    32 year old female presenting today for continued abdominal pain, diagnosed yesterday with urinary tract infection treated with 1 dose of Keflex prior to arrival.  CBC nonacute POC beta hCG negative Urinalysis with glucose however otherwise negative CMP with elevated glucose Lipase within normal limits Initial CBG of 360 Patient given fluid bolus Second CBG of 287  Patient given Zofran and her second dose of Keflex  Patient reevaluated states that she is feeling improved, states that her abdominal pain has diminished greatly and that she has ready to be discharged.  Patient is afebrile, non-toxic appearing, sitting comfortably on examination table. Patient's pain and other symptoms adequately managed in emergency department.  Suspect that patient's pain today is secondary to her previously diagnosed urinary tract infection, patient is only received 1 dose of Keflex prior to arrival.  Do not suspect pyelonephritis at this time.  Patient's blood glucose level has improved following fluids.  Patient is not in DKA at this time. Patient does not meet the SIRS or Sepsis criteria.  On repeat exam patient does not have a surgical abdomen and there are no peritoneal signs.  No indication of appendicitis, bowel obstruction, bowel perforation, cholecystitis, diverticulitis, PID or ectopic pregnancy.  Patient has been strongly encouraged to continue taking her Keflex as prescribed.  Patient has been given ODT Zofran prescription today if her nausea returns.  Encouraged fluid intake and rest to help with her symptoms.  I encouraged the patient to follow-up with her primary care provider tomorrow.  At this time there does not appear to be any evidence of an  acute emergency medical condition and the patient appears stable for discharge with appropriate outpatient follow up. Diagnosis was discussed with patient who  verbalizes understanding of care plan and is agreeable to discharge. I have discussed return precautions with patient who verbalizes understanding of return precautions. Patient strongly encouraged to follow-up with their PCP this week. All questions answered.  Patient's case discussed with Dr. Eulis Foster who agrees with plan to discharge with PCP follow-up.   Note: Portions of this report may have been transcribed using voice recognition software. Every effort was made to ensure accuracy; however, inadvertent computerized transcription errors may still be present. Final Clinical Impressions(s) / ED Diagnoses   Final diagnoses:  Generalized abdominal pain  Lower urinary tract infectious disease    ED Discharge Orders         Ordered    ondansetron (ZOFRAN ODT) 4 MG disintegrating tablet  Every 8 hours PRN     10/02/18 1210           Deliah Boston, PA-C 10/02/18 1713    Daleen Bo, MD 10/03/18 [phone]

## 2018-10-02 NOTE — ED Triage Notes (Signed)
Pt states she has been having abd pain since waking this morning with n/v and her glucose was 340.  Took meds including insulin this morning.

## 2018-10-27 DIAGNOSIS — S298XXA Other specified injuries of thorax, initial encounter: Secondary | ICD-10-CM | POA: Insufficient documentation

## 2019-01-23 ENCOUNTER — Emergency Department (HOSPITAL_COMMUNITY): Payer: Medicaid Other

## 2019-01-23 ENCOUNTER — Encounter (HOSPITAL_COMMUNITY): Payer: Self-pay | Admitting: Emergency Medicine

## 2019-01-23 ENCOUNTER — Emergency Department (HOSPITAL_COMMUNITY)
Admission: EM | Admit: 2019-01-23 | Discharge: 2019-01-23 | Disposition: A | Discharge status: Home / Self Care | Payer: Medicaid Other | Attending: Emergency Medicine | Admitting: Emergency Medicine

## 2019-01-23 ENCOUNTER — Other Ambulatory Visit: Payer: Self-pay

## 2019-01-23 DIAGNOSIS — J181 Lobar pneumonia, unspecified organism: Secondary | ICD-10-CM | POA: Insufficient documentation

## 2019-01-23 DIAGNOSIS — J189 Pneumonia, unspecified organism: Secondary | ICD-10-CM

## 2019-01-23 DIAGNOSIS — J449 Chronic obstructive pulmonary disease, unspecified: Secondary | ICD-10-CM | POA: Insufficient documentation

## 2019-01-23 DIAGNOSIS — Z87891 Personal history of nicotine dependence: Secondary | ICD-10-CM | POA: Diagnosis not present

## 2019-01-23 DIAGNOSIS — Z794 Long term (current) use of insulin: Secondary | ICD-10-CM | POA: Diagnosis not present

## 2019-01-23 DIAGNOSIS — R062 Wheezing: Secondary | ICD-10-CM | POA: Diagnosis present

## 2019-01-23 DIAGNOSIS — Z79899 Other long term (current) drug therapy: Secondary | ICD-10-CM | POA: Insufficient documentation

## 2019-01-23 DIAGNOSIS — E119 Type 2 diabetes mellitus without complications: Secondary | ICD-10-CM | POA: Insufficient documentation

## 2019-01-23 LAB — CBG MONITORING, ED: Glucose-Capillary: 243 mg/dL — ABNORMAL HIGH (ref 70–99)

## 2019-01-23 MED ORDER — MORPHINE SULFATE (PF) 10 MG/ML IV SOLN
10.0000 mg | Freq: Once | INTRAVENOUS | Status: DC
  Filled 2019-01-23: qty 1

## 2019-01-23 MED ORDER — CEFTRIAXONE SODIUM 1 G IJ SOLR
1.0000 g | Freq: Once | INTRAMUSCULAR | Status: AC
  Administered 2019-01-23: 1 g via INTRAMUSCULAR
  Filled 2019-01-23: qty 10

## 2019-01-23 MED ORDER — STERILE WATER FOR INJECTION IJ SOLN
INTRAMUSCULAR | Status: AC
  Administered 2019-01-23: 2.1 mL
  Filled 2019-01-23: qty 10

## 2019-01-23 MED ORDER — HYDROCODONE-HOMATROPINE 5-1.5 MG/5ML PO SYRP: 5.0000 mL | ORAL_SOLUTION | Freq: Four times a day (QID) | ORAL | 0 refills | Status: DC | PRN

## 2019-01-23 MED ORDER — OXYCODONE HCL 5 MG PO TABS
5.0000 mg | ORAL_TABLET | Freq: Once | ORAL | Status: AC
  Administered 2019-01-23: 5 mg via ORAL
  Filled 2019-01-23: qty 1

## 2019-01-23 MED ORDER — ALBUTEROL SULFATE HFA 108 (90 BASE) MCG/ACT IN AERS
3.0000 | INHALATION_SPRAY | Freq: Once | RESPIRATORY_TRACT | Status: AC
  Administered 2019-01-23: 3 via RESPIRATORY_TRACT
  Filled 2019-01-23: qty 6.7

## 2019-01-23 MED ORDER — METOCLOPRAMIDE HCL 10 MG PO TABS
10.0000 mg | ORAL_TABLET | Freq: Once | ORAL | Status: DC
  Filled 2019-01-23: qty 1

## 2019-01-23 MED ORDER — DOXYCYCLINE HYCLATE 100 MG PO CAPS: 100.0000 mg | ORAL_CAPSULE | Freq: Two times a day (BID) | ORAL | 0 refills | Status: DC

## 2019-01-23 MED ORDER — AZITHROMYCIN 250 MG PO TABS
500.0000 mg | ORAL_TABLET | Freq: Once | ORAL | Status: AC
  Administered 2019-01-23: 500 mg via ORAL
  Filled 2019-01-23: qty 2

## 2019-01-23 MED ORDER — HYDROXYZINE HCL 25 MG PO TABS
25.0000 mg | ORAL_TABLET | Freq: Once | ORAL | Status: AC
  Administered 2019-01-23: 25 mg via ORAL
  Filled 2019-01-23: qty 1

## 2019-01-23 NOTE — ED Provider Notes (Signed)
[facility] EMERGENCY DEPARTMENT Provider Note   CSN: [id number] Arrival date & time: 01/23/19  1628    History   Chief Complaint Chief Complaint  Patient presents with  . Cough    HPI Jaime [name] is a 33 y.o. female.     Patient is a 33 year old female who presents to the emergency department with a complaint of cough, and wheezing.  The patient states that this problem started around midnight on last evening, March 22.  She was noted to have some dizziness when walking, and feeling off balance.  She went back to lay down and was able to fall asleep.  She awakened approximately 11 AM this morning and was having coughing, wheezing, and a sensation of shortness of breath.  She has a pulse oximetry machine at home and she said it measured 76 at her home.  She complained of headache in the temporal areas and still some sensation of being off balance with certain movement pill.  The cough is nonproductive.  The patient states that she has not not checked her temperature, but has not had chills or sweats.  There is been no vomiting reported.  The patient states that she had no significant changes in her medical condition or complaints during the weekend.  She has not been in contact with anyone who is been diagnosed with coronavirus.  She is not been to any hotspots here in the [place], and she has not traveled outside of the country in the last month.  She presents to the emergency department for evaluation and assistance with this issue.  The history is provided by the patient.    Past Medical History:  Diagnosis Date  . Anxiety   . Chronic abdominal pain   . Chronic back pain   . Cirrhosis ([place])   . COPD (chronic obstructive pulmonary disease) ([place])   . Depression   . Diabetes mellitus without complication ([place])   . Hepatitis C   . Insomnia   . Long-term current use of methadone for opiate dependence ([place])   . Migraine headache   . Peptic ulcer     Patient Active  Problem List   Diagnosis Date Noted  . Uncontrolled diabetes mellitus ([place]) 09/19/2018  . Hyperglycemia 09/17/2018  . Acute respiratory failure with hypoxia ([place]) 09/15/2018  . Anxiety 09/15/2018  . Chronic abdominal pain 09/15/2018  . Diabetes mellitus without complication ([place]) 79/12/4095  . Hepatitis C 09/15/2018  . Peptic ulcer 09/15/2018  . Long-term current use of methadone for opiate dependence ([place]) 09/15/2018  . Cirrhosis ([place]) 09/15/2018  . CAP (community acquired pneumonia) 07/17/2016  . Acute hypoxemic respiratory failure ([place]) 07/17/2016  . Pleural effusion on right 07/17/2016  . Multiple rib fractures involving four or more ribs 07/17/2016  . Hemothorax on right 07/17/2016  . Chronic pain disorder 07/17/2016    Past Surgical History:  Procedure Laterality Date  . CHOLECYSTECTOMY       OB History    Gravida      Para      Term      Preterm      AB      Living  0     SAB      TAB      Ectopic      Multiple      Live Births               Home Medications    Prior to Admission medications   Medication  Sig Start Date End Date Taking? Authorizing Provider  albuterol (PROVENTIL) (2.5 MG/3ML) 0.083% nebulizer solution Take 2.5 mg by nebulization every 6 (six) hours as needed for wheezing or shortness of breath.   Yes [provider]  cetirizine (ZYRTEC) 10 MG tablet Take 10 mg by mouth every evening.   Yes [provider]  diclofenac sodium (VOLTAREN) 1 % GEL Apply 2 g topically 4 (four) times daily as needed (pain).  08/13/18  Yes [provider]  etonogestrel (NEXPLANON) 68 MG IMPL implant 1 each by Subdermal route once.   Yes [provider]  fluticasone furoate-vilanterol (BREO ELLIPTA) 100-25 MCG/INH AEPB Inhale 1 puff into the lungs daily.   Yes [provider]  insulin NPH-regular Human (70-30) 100 UNIT/ML injection Inject 0-6 Units into the skin 3 (three) times daily. Per sliding scale  200= 2  units 250= 4 units 350= 6 units 400= Go to  ER 08/24/18  Yes [provider]  insulin regular (NOVOLIN R,HUMULIN R) 100 units/mL injection Inject 20 Units into the skin 2 (two) times daily before a meal.   Yes [provider]  lactulose (CHRONULAC) 10 GM/15ML solution Take 15 mLs by mouth 2 (two) times daily as needed for mild constipation or moderate constipation.  08/24/18  Yes [provider]  Menthol (BIOFREEZE) 10 % AERO Apply 1 application topically as needed (pain).   Yes [provider]  metFORMIN (GLUCOPHAGE-XR) 500 MG 24 hr tablet Take 1,000 mg by mouth 2 (two) times daily.   Yes [provider]  methadone (DOLOPHINE) 10 MG tablet Take 2 tablets (20 mg total) by mouth every 12 (twelve) hours. Patient taking differently: Take 50 mg by mouth at bedtime.  07/20/16  Yes Sinda Du, MD  pantoprazole (PROTONIX) 40 MG tablet Take 40 mg by mouth every morning.    Yes [provider]  potassium chloride SA (K-DUR,KLOR-CON) 20 MEQ tablet Take 20-40 tablets by mouth daily as needed (for low potassium).  07/28/18  Yes [provider]  prochlorperazine (COMPAZINE) 10 MG tablet Take 10 mg by mouth 4 (four) times daily as needed for nausea or vomiting.  12/08/18  Yes [provider]  promethazine (PHENERGAN) 25 MG tablet Take 25 mg by mouth daily as needed for nausea or vomiting.   Yes [provider]  spironolactone (ALDACTONE) 50 MG tablet Take 50-100 tablets by mouth daily as needed (for fluid).  09/09/18  Yes [provider]  sucralfate (CARAFATE) 1 g tablet Take 1 g by mouth 4 (four) times daily as needed (ulcers).    Yes [provider]  Suvorexant (BELSOMRA) 20 MG TABS Take 20 mg by mouth at bedtime.    Yes [provider]  tetrahydrozoline 0.05 % ophthalmic solution Place 2 drops into both eyes 2 (two) times daily as needed (allergies/dry/itchy).   Yes [provider]  torsemide  (DEMADEX) 20 MG tablet Take 20-40 tablets by mouth daily as needed (for fluid).    Yes [provider]  vortioxetine HBr (TRINTELLIX) 20 MG TABS tablet Take 20 mg by mouth every evening.    Yes [provider]  zolpidem (AMBIEN) 10 MG tablet Take 1 tablet by mouth at bedtime.  03/14/18  Yes [provider]  clonazePAM (KLONOPIN) 1 MG tablet Take 1 mg by mouth 4 (four) times daily. 01/07/19   [provider]    Family History Family History  Problem Relation Age of Onset  . Hypertension Mother   . Hyperlipidemia  Other     Social History Social History   Tobacco Use  . Smoking status: Former Smoker    Packs/day: 0.00    Years: 5.00    Pack years: 0.00  . Smokeless tobacco: Never Used  Substance Use Topics  . Alcohol use: No  . Drug use: No     Allergies   Iodine-131; Ivp dye [iodinated diagnostic agents]; Ketorolac tromethamine; Tylenol [acetaminophen]; Nsaids; and Vancomycin   Review of Systems Review of Systems   Physical Exam Updated Vital Signs BP 126/87 (BP Location: Left Arm)   Pulse (!) 115   Temp 98.1 F (36.7 C) (Oral)   Resp 20   Ht 5' 10"  (1.778 m)   Wt 122 kg   SpO2 91%   BMI 38.60 kg/m   Physical Exam Vitals signs and nursing note reviewed.  Constitutional:      Appearance: She is well-developed. She is not toxic-appearing.  HENT:     Head: Normocephalic.     Right Ear: Tympanic membrane and external ear normal.     Left Ear: Tympanic membrane and external ear normal.  Eyes:     General: Lids are normal.     Pupils: Pupils are equal, round, and reactive to light.  Neck:     Musculoskeletal: Normal range of motion and neck supple.     Vascular: No carotid bruit.  Cardiovascular:     Rate and Rhythm: Regular rhythm. Tachycardia present.     Pulses: Normal pulses.     Heart sounds: Normal heart sounds.  Pulmonary:     Effort: Tachypnea present. No respiratory distress.     Breath sounds: Normal breath  sounds.     Comments: Patient speaking in complete sentences.  Patient is tachypneic at 36 breaths per minute. Abdominal:     General: Bowel sounds are normal.     Palpations: Abdomen is soft.     Tenderness: There is no abdominal tenderness. There is no guarding.  Musculoskeletal: Normal range of motion.  Lymphadenopathy:     Head:     Right side of head: No submandibular adenopathy.     Left side of head: No submandibular adenopathy.     Cervical: No cervical adenopathy.  Skin:    General: Skin is warm and dry.  Neurological:     Mental Status: She is alert and oriented to person, place, and time.     Cranial Nerves: No cranial nerve deficit.     Sensory: No sensory deficit.  Psychiatric:        Speech: Speech normal.      ED Treatments / Results  Labs (all labs ordered are listed, but only abnormal results are displayed) Labs Reviewed - No data to display  EKG None  Radiology No results found.  Procedures Procedures (including critical care time)  Medications Ordered in ED Medications - No data to display   Initial Impression / Assessment and Plan / ED Course  I have reviewed the triage vital signs and the nursing notes.  Pertinent labs & imaging results that were available during my care of the patient were reviewed by me and considered in my medical decision making (see chart for details).          Final Clinical Impressions(s) / ED Diagnoses MDM  Heart rate and respiratory rate are elevated.  Pulse oximetry is 91 to 92% on room air.  Acceptable by my interpretation, but will observe closely.  CBG is elevated at 243. Chest x-ray shows  focal left base consolidation consistent with pneumonia.  I discussed the findings with the patient in terms which she understands.  The plan at this time is for the patient to be placed on doxycycline 2 times daily.  I have given the patient albuterol inhaler, 2 puffs every 4 hours.  Patient is also given Hycodan to use  for cough.  The patient states that she uses oxygen at home, but she has not had oxygen recently because of some confusion with her Medicaid benefits.  I have asked her to speak with her Medicaid social worker as well as her physician to try to rectify this issue as soon as possible.  The patient is advised to return to the emergency department or see the primary physician if any worsening of her condition, worsening of symptoms, problems, or concerns.   Final diagnoses:  Community acquired pneumonia of left lower lobe of lung [facility])    ED Discharge Orders         Ordered    HYDROcodone-homatropine (HYCODAN) 5-1.5 MG/5ML syrup  Every 6 hours PRN     01/23/19 2031    doxycycline (VIBRAMYCIN) 100 MG capsule  2 times daily     01/23/19 2031           Lily Kocher, PA-C 01/23/19 2056    Hayden Rasmussen, MD 01/24/19 1131

## 2019-01-23 NOTE — Discharge Instructions (Addendum)
Your chest x-ray shows a left lower lobe pneumonia.  Please increase fluids.  Please wash hands frequently.  Keep a distance of 6 feet from people.  Use your mask until symptoms have resolved.  Use doxycycline 2 times daily with food until all taken, and 2 puffs of albuterol every 4 hours.  May use Hycodan for cough if needed. This medication may cause drowsiness. Please do not drink, drive, or participate in activity that requires concentration while taking this medication.  Please call Dr. Luan Pulling on tomorrow and set up an outpatient appointment for recheck.

## 2019-01-23 NOTE — ED Triage Notes (Signed)
Patient complaining of cough, wheezing, and shortness of breath upon awakening this morning.

## 2019-01-24 ENCOUNTER — Inpatient Hospital Stay (HOSPITAL_COMMUNITY): Payer: Medicaid Other

## 2019-01-24 ENCOUNTER — Other Ambulatory Visit: Payer: Self-pay

## 2019-01-24 ENCOUNTER — Inpatient Hospital Stay (HOSPITAL_COMMUNITY)
Admission: EM | Admit: 2019-01-24 | Discharge: 2019-01-30 | DRG: 871 | Disposition: A | Discharge status: Home / Self Care | Payer: Medicaid Other | Attending: Internal Medicine | Admitting: Internal Medicine

## 2019-01-24 ENCOUNTER — Encounter (HOSPITAL_COMMUNITY): Payer: Self-pay | Admitting: *Deleted

## 2019-01-24 ENCOUNTER — Emergency Department (HOSPITAL_COMMUNITY): Payer: Medicaid Other

## 2019-01-24 DIAGNOSIS — F329 Major depressive disorder, single episode, unspecified: Secondary | ICD-10-CM | POA: Diagnosis present

## 2019-01-24 DIAGNOSIS — E1165 Type 2 diabetes mellitus with hyperglycemia: Secondary | ICD-10-CM | POA: Diagnosis present

## 2019-01-24 DIAGNOSIS — E876 Hypokalemia: Secondary | ICD-10-CM | POA: Diagnosis present

## 2019-01-24 DIAGNOSIS — Z79891 Long term (current) use of opiate analgesic: Secondary | ICD-10-CM | POA: Diagnosis not present

## 2019-01-24 DIAGNOSIS — R0603 Acute respiratory distress: Secondary | ICD-10-CM

## 2019-01-24 DIAGNOSIS — J440 Chronic obstructive pulmonary disease with acute lower respiratory infection: Secondary | ICD-10-CM | POA: Diagnosis present

## 2019-01-24 DIAGNOSIS — K7460 Unspecified cirrhosis of liver: Secondary | ICD-10-CM | POA: Diagnosis present

## 2019-01-24 DIAGNOSIS — Z79899 Other long term (current) drug therapy: Secondary | ICD-10-CM

## 2019-01-24 DIAGNOSIS — E872 Acidosis, unspecified: Secondary | ICD-10-CM

## 2019-01-24 DIAGNOSIS — D6959 Other secondary thrombocytopenia: Secondary | ICD-10-CM | POA: Diagnosis present

## 2019-01-24 DIAGNOSIS — K7581 Nonalcoholic steatohepatitis (NASH): Secondary | ICD-10-CM | POA: Diagnosis present

## 2019-01-24 DIAGNOSIS — J45901 Unspecified asthma with (acute) exacerbation: Secondary | ICD-10-CM | POA: Diagnosis present

## 2019-01-24 DIAGNOSIS — J9601 Acute respiratory failure with hypoxia: Secondary | ICD-10-CM | POA: Diagnosis present

## 2019-01-24 DIAGNOSIS — Z6841 Body Mass Index (BMI) 40.0 and over, adult: Secondary | ICD-10-CM

## 2019-01-24 DIAGNOSIS — Z0189 Encounter for other specified special examinations: Secondary | ICD-10-CM

## 2019-01-24 DIAGNOSIS — L8990 Pressure ulcer of unspecified site, unspecified stage: Secondary | ICD-10-CM | POA: Diagnosis present

## 2019-01-24 DIAGNOSIS — R0602 Shortness of breath: Secondary | ICD-10-CM

## 2019-01-24 DIAGNOSIS — Z87891 Personal history of nicotine dependence: Secondary | ICD-10-CM

## 2019-01-24 DIAGNOSIS — Z9109 Other allergy status, other than to drugs and biological substances: Secondary | ICD-10-CM

## 2019-01-24 DIAGNOSIS — Z794 Long term (current) use of insulin: Secondary | ICD-10-CM | POA: Diagnosis not present

## 2019-01-24 DIAGNOSIS — Z886 Allergy status to analgesic agent status: Secondary | ICD-10-CM

## 2019-01-24 DIAGNOSIS — J9600 Acute respiratory failure, unspecified whether with hypoxia or hypercapnia: Secondary | ICD-10-CM | POA: Diagnosis present

## 2019-01-24 DIAGNOSIS — Z452 Encounter for adjustment and management of vascular access device: Secondary | ICD-10-CM

## 2019-01-24 DIAGNOSIS — D61818 Other pancytopenia: Secondary | ICD-10-CM | POA: Diagnosis present

## 2019-01-24 DIAGNOSIS — J80 Acute respiratory distress syndrome: Secondary | ICD-10-CM

## 2019-01-24 DIAGNOSIS — F419 Anxiety disorder, unspecified: Secondary | ICD-10-CM | POA: Diagnosis present

## 2019-01-24 DIAGNOSIS — Z881 Allergy status to other antibiotic agents status: Secondary | ICD-10-CM

## 2019-01-24 DIAGNOSIS — A419 Sepsis, unspecified organism: Principal | ICD-10-CM | POA: Diagnosis present

## 2019-01-24 DIAGNOSIS — J181 Lobar pneumonia, unspecified organism: Secondary | ICD-10-CM

## 2019-01-24 DIAGNOSIS — B1920 Unspecified viral hepatitis C without hepatic coma: Secondary | ICD-10-CM | POA: Diagnosis present

## 2019-01-24 DIAGNOSIS — Z91041 Radiographic dye allergy status: Secondary | ICD-10-CM

## 2019-01-24 DIAGNOSIS — G8929 Other chronic pain: Secondary | ICD-10-CM | POA: Diagnosis present

## 2019-01-24 DIAGNOSIS — R6520 Severe sepsis without septic shock: Secondary | ICD-10-CM | POA: Diagnosis present

## 2019-01-24 DIAGNOSIS — J189 Pneumonia, unspecified organism: Secondary | ICD-10-CM | POA: Diagnosis present

## 2019-01-24 LAB — CBC WITH DIFFERENTIAL/PLATELET
Abs Immature Granulocytes: 0 10*3/uL (ref 0.00–0.07)
Basophils Absolute: 0 10*3/uL (ref 0.0–0.1)
Basophils Relative: 1 %
Eosinophils Absolute: 0 10*3/uL (ref 0.0–0.5)
Eosinophils Relative: 1 %
HCT: 43.5 % (ref 36.0–46.0)
Hemoglobin: 14 g/dL (ref 12.0–15.0)
Immature Granulocytes: 0 %
Lymphocytes Relative: 20 %
Lymphs Abs: 0.7 10*3/uL (ref 0.7–4.0)
MCH: 28.7 pg (ref 26.0–34.0)
MCHC: 32.2 g/dL (ref 30.0–36.0)
MCV: 89.3 fL (ref 80.0–100.0)
MONO ABS: 0.2 10*3/uL (ref 0.1–1.0)
Monocytes Relative: 5 %
NEUTROS ABS: 2.4 10*3/uL (ref 1.7–7.7)
NEUTROS PCT: 73 %
Platelets: 65 10*3/uL — ABNORMAL LOW (ref 150–400)
RBC: 4.87 MIL/uL (ref 3.87–5.11)
RDW: 13.7 % (ref 11.5–15.5)
WBC Morphology: INCREASED
WBC: 3.2 10*3/uL — ABNORMAL LOW (ref 4.0–10.5)
nRBC: 0 % (ref 0.0–0.2)

## 2019-01-24 LAB — LACTIC ACID, PLASMA
LACTIC ACID, VENOUS: 8.7 mmol/L — AB (ref 0.5–1.9)
Lactic Acid, Venous: 5.9 mmol/L (ref 0.5–1.9)
Lactic Acid, Venous: 3.6 mmol/L (ref 0.5–1.9)
Lactic Acid, Venous: 2.6 mmol/L (ref 0.5–1.9)

## 2019-01-24 LAB — BLOOD GAS, ARTERIAL
Acid-base deficit: 10.3 mmol/L — ABNORMAL HIGH (ref 0.0–2.0)
Bicarbonate: 16.2 mmol/L — ABNORMAL LOW (ref 20.0–28.0)
FIO2: 100
O2 SAT: 98.6 %
PCO2 ART: 35.7 mmHg (ref 32.0–48.0)
Patient temperature: 39.2
pH, Arterial: 7.258 — ABNORMAL LOW (ref 7.350–7.450)
pO2, Arterial: 151 mmHg — ABNORMAL HIGH (ref 83.0–108.0)

## 2019-01-24 LAB — RESPIRATORY PANEL BY PCR
Adenovirus: NOT DETECTED
Bordetella pertussis: NOT DETECTED
Chlamydophila pneumoniae: NOT DETECTED
Coronavirus 229E: NOT DETECTED
Coronavirus HKU1: NOT DETECTED
Coronavirus NL63: NOT DETECTED
Coronavirus OC43: NOT DETECTED
Influenza A: NOT DETECTED
Influenza B: NOT DETECTED
Metapneumovirus: NOT DETECTED
Mycoplasma pneumoniae: NOT DETECTED
Parainfluenza Virus 1: NOT DETECTED
Parainfluenza Virus 2: NOT DETECTED
Parainfluenza Virus 3: NOT DETECTED
Parainfluenza Virus 4: NOT DETECTED
RESPIRATORY SYNCYTIAL VIRUS-RVPPCR: NOT DETECTED
Rhinovirus / Enterovirus: NOT DETECTED

## 2019-01-24 LAB — COMPREHENSIVE METABOLIC PANEL
ALT: 27 U/L (ref 0–44)
ANION GAP: 13 (ref 5–15)
AST: 33 U/L (ref 15–41)
Albumin: 3.6 g/dL (ref 3.5–5.0)
Alkaline Phosphatase: 92 U/L (ref 38–126)
BUN: 8 mg/dL (ref 6–20)
CO2: 22 mmol/L (ref 22–32)
Calcium: 8.6 mg/dL — ABNORMAL LOW (ref 8.9–10.3)
Chloride: 100 mmol/L (ref 98–111)
Creatinine, Ser: 0.62 mg/dL (ref 0.44–1.00)
GFR calc Af Amer: >60 mL/min (ref >60)
GFR calc non Af Amer: >60 mL/min (ref >60)
GLUCOSE: 242 mg/dL — AB (ref 70–99)
Potassium: 3.6 mmol/L (ref 3.5–5.1)
Sodium: 135 mmol/L (ref 135–145)
Total Bilirubin: 1.9 mg/dL — ABNORMAL HIGH (ref 0.3–1.2)
Total Protein: 7.2 g/dL (ref 6.5–8.1)

## 2019-01-24 LAB — RAPID URINE DRUG SCREEN, HOSP PERFORMED
Amphetamines: NOT DETECTED
BARBITURATES: NOT DETECTED
Benzodiazepines: POSITIVE — AB
Cocaine: NOT DETECTED
Opiates: NOT DETECTED
TETRAHYDROCANNABINOL: NOT DETECTED

## 2019-01-24 LAB — BETA-HYDROXYBUTYRIC ACID: Beta-Hydroxybutyric Acid: 0.06 mmol/L (ref 0.05–0.27)

## 2019-01-24 LAB — PROTIME-INR
INR: 1.4 — ABNORMAL HIGH (ref 0.8–1.2)
Prothrombin Time: 16.9 seconds — ABNORMAL HIGH (ref 11.4–15.2)

## 2019-01-24 LAB — BLOOD GAS, VENOUS
Acid-base deficit: 4.8 mmol/L — ABNORMAL HIGH (ref 0.0–2.0)
Bicarbonate: 20.8 mmol/L (ref 20.0–28.0)
FIO2: 40
O2 Saturation: 95.7 %
PCO2 VEN: 32.7 mmHg — AB (ref 44.0–60.0)
PO2 VEN: 81.6 mmHg — AB (ref 32.0–45.0)
Patient temperature: 39.2
pH, Ven: 7.388 (ref 7.250–7.430)

## 2019-01-24 LAB — INFLUENZA PANEL BY PCR (TYPE A & B)
Influenza A By PCR: NEGATIVE
Influenza B By PCR: NEGATIVE

## 2019-01-24 LAB — TRIGLYCERIDES: Triglycerides: 122 mg/dL (ref <150)

## 2019-01-24 LAB — GLUCOSE, CAPILLARY
Glucose-Capillary: 240 mg/dL — ABNORMAL HIGH (ref 70–99)
Glucose-Capillary: 197 mg/dL — ABNORMAL HIGH (ref 70–99)

## 2019-01-24 LAB — STREP PNEUMONIAE URINARY ANTIGEN: Strep Pneumo Urinary Antigen: NEGATIVE

## 2019-01-24 LAB — PROCALCITONIN: Procalcitonin: 5.55 ng/mL

## 2019-01-24 LAB — HCG, QUANTITATIVE, PREGNANCY: hCG, Beta Chain, Quant, S: <1 m[IU]/mL (ref <5)

## 2019-01-24 MED ORDER — DEXMEDETOMIDINE HCL IN NACL 200 MCG/50ML IV SOLN: 0.0000 ug/kg/h | INTRAVENOUS | Status: DC

## 2019-01-24 MED ORDER — SODIUM CHLORIDE 0.9 % IV SOLN
500.0000 mg | INTRAVENOUS | Status: DC
  Administered 2019-01-24 – 2019-01-28 (×5): 500 mg via INTRAVENOUS
  Filled 2019-01-24 (×5): qty 500

## 2019-01-24 MED ORDER — SODIUM CHLORIDE 0.9 % IV SOLN
2.0000 g | INTRAVENOUS | Status: DC
  Administered 2019-01-24 – 2019-01-29 (×6): 2 g via INTRAVENOUS
  Filled 2019-01-24 (×4): qty 2
  Filled 2019-01-24: qty 20
  Filled 2019-01-24: qty 2
  Filled 2019-01-24: qty 20
  Filled 2019-01-24: qty 2

## 2019-01-24 MED ORDER — SUCCINYLCHOLINE CHLORIDE 20 MG/ML IJ SOLN
150.0000 mg | Freq: Once | INTRAMUSCULAR | Status: AC
  Administered 2019-01-24: 150 mg via INTRAVENOUS

## 2019-01-24 MED ORDER — SODIUM CHLORIDE 0.9 % IV BOLUS
1000.0000 mL | Freq: Once | INTRAVENOUS | Status: AC
  Administered 2019-01-24: 1000 mL via INTRAVENOUS

## 2019-01-24 MED ORDER — CHLORHEXIDINE GLUCONATE CLOTH 2 % EX PADS
6.0000 | MEDICATED_PAD | Freq: Every day | CUTANEOUS | Status: DC
  Administered 2019-01-25 – 2019-01-26 (×2): 6 via TOPICAL

## 2019-01-24 MED ORDER — SODIUM CHLORIDE 0.9 % IV SOLN
0.5000 mg/h | INTRAVENOUS | Status: DC
  Administered 2019-01-24: 0.5 mg/h via INTRAVENOUS
  Filled 2019-01-24: qty 10

## 2019-01-24 MED ORDER — ACETAMINOPHEN 500 MG PO TABS
ORAL_TABLET | ORAL | Status: AC
  Filled 2019-01-24: qty 2

## 2019-01-24 MED ORDER — INSULIN ASPART 100 UNIT/ML ~~LOC~~ SOLN
0.0000 [IU] | SUBCUTANEOUS | Status: DC
  Administered 2019-01-24: 4 [IU] via SUBCUTANEOUS
  Administered 2019-01-24: 7 [IU] via SUBCUTANEOUS
  Administered 2019-01-25 (×2): 11 [IU] via SUBCUTANEOUS
  Administered 2019-01-25 (×2): 15 [IU] via SUBCUTANEOUS
  Administered 2019-01-25: 11 [IU] via SUBCUTANEOUS
  Administered 2019-01-25 – 2019-01-26 (×2): 15 [IU] via SUBCUTANEOUS
  Administered 2019-01-26 (×2): 7 [IU] via SUBCUTANEOUS
  Administered 2019-01-26: 11 [IU] via SUBCUTANEOUS
  Administered 2019-01-26: 4 [IU] via SUBCUTANEOUS
  Administered 2019-01-26: 15 [IU] via SUBCUTANEOUS

## 2019-01-24 MED ORDER — ETOMIDATE 2 MG/ML IV SOLN
20.0000 mg | Freq: Once | INTRAVENOUS | Status: AC
  Administered 2019-01-24: 20 mg via INTRAVENOUS

## 2019-01-24 MED ORDER — ALBUTEROL SULFATE HFA 108 (90 BASE) MCG/ACT IN AERS
6.0000 | INHALATION_SPRAY | Freq: Four times a day (QID) | RESPIRATORY_TRACT | Status: DC | PRN
  Filled 2019-01-24: qty 6.7

## 2019-01-24 MED ORDER — FENTANYL CITRATE (PF) 100 MCG/2ML IJ SOLN: 100.0000 ug | INTRAMUSCULAR | Status: DC | PRN

## 2019-01-24 MED ORDER — PANTOPRAZOLE SODIUM 40 MG IV SOLR
40.0000 mg | Freq: Every day | INTRAVENOUS | Status: DC
  Administered 2019-01-24: 40 mg via INTRAVENOUS
  Filled 2019-01-24: qty 40

## 2019-01-24 MED ORDER — IBUPROFEN 400 MG PO TABS
ORAL_TABLET | ORAL | Status: AC
  Filled 2019-01-24: qty 2

## 2019-01-24 MED ORDER — IPRATROPIUM-ALBUTEROL 20-100 MCG/ACT IN AERS
6.0000 | INHALATION_SPRAY | RESPIRATORY_TRACT | Status: DC
  Filled 2019-01-24: qty 4

## 2019-01-24 MED ORDER — PROPOFOL 1000 MG/100ML IV EMUL
INTRAVENOUS | Status: AC
  Administered 2019-01-24: 5 ug/kg/min via INTRAVENOUS
  Filled 2019-01-24: qty 100

## 2019-01-24 MED ORDER — FENTANYL CITRATE (PF) 100 MCG/2ML IJ SOLN: 50.0000 ug | Freq: Once | INTRAMUSCULAR | Status: DC

## 2019-01-24 MED ORDER — FENTANYL CITRATE (PF) 100 MCG/2ML IJ SOLN
INTRAMUSCULAR | Status: AC
  Filled 2019-01-24: qty 2

## 2019-01-24 MED ORDER — METHYLPREDNISOLONE SODIUM SUCC 40 MG IJ SOLR
40.0000 mg | Freq: Two times a day (BID) | INTRAMUSCULAR | Status: DC
  Administered 2019-01-24 – 2019-01-25 (×2): 40 mg via INTRAVENOUS
  Filled 2019-01-24 (×2): qty 1

## 2019-01-24 MED ORDER — PROPOFOL 1000 MG/100ML IV EMUL
0.0000 ug/kg/min | INTRAVENOUS | Status: DC
  Administered 2019-01-24: 50 ug/kg/min via INTRAVENOUS
  Administered 2019-01-25: 30 ug/kg/min via INTRAVENOUS
  Administered 2019-01-25: 40 ug/kg/min via INTRAVENOUS
  Administered 2019-01-25: 30 ug/kg/min via INTRAVENOUS
  Administered 2019-01-25 (×2): 40 ug/kg/min via INTRAVENOUS
  Administered 2019-01-26: 10 ug/kg/min via INTRAVENOUS
  Filled 2019-01-24: qty 100
  Filled 2019-01-24: qty 200
  Filled 2019-01-24 (×3): qty 100
  Filled 2019-01-24: qty 200
  Filled 2019-01-24 (×5): qty 100

## 2019-01-24 MED ORDER — ACETAMINOPHEN 500 MG PO TABS
1000.0000 mg | ORAL_TABLET | Freq: Once | ORAL | Status: AC
  Administered 2019-01-24: 1000 mg via ORAL

## 2019-01-24 MED ORDER — IPRATROPIUM BROMIDE HFA 17 MCG/ACT IN AERS
6.0000 | INHALATION_SPRAY | RESPIRATORY_TRACT | Status: DC
  Filled 2019-01-24: qty 12.9

## 2019-01-24 MED ORDER — FENTANYL 2500MCG IN NS 250ML (10MCG/ML) PREMIX INFUSION
25.0000 ug/h | INTRAVENOUS | Status: DC
  Administered 2019-01-24: 50 ug/h via INTRAVENOUS
  Administered 2019-01-25: 75 ug/h via INTRAVENOUS
  Filled 2019-01-24 (×3): qty 250

## 2019-01-24 MED ORDER — IPRATROPIUM-ALBUTEROL 0.5-2.5 (3) MG/3ML IN SOLN
3.0000 mL | RESPIRATORY_TRACT | Status: DC
  Administered 2019-01-24 – 2019-01-26 (×10): 3 mL via RESPIRATORY_TRACT
  Filled 2019-01-24 (×11): qty 3

## 2019-01-24 MED ORDER — MIDAZOLAM 50MG/50ML (1MG/ML) PREMIX INFUSION: 0.5000 mg/h | INTRAVENOUS | Status: DC

## 2019-01-24 MED ORDER — ALBUTEROL SULFATE HFA 108 (90 BASE) MCG/ACT IN AERS
6.0000 | INHALATION_SPRAY | RESPIRATORY_TRACT | Status: DC
  Filled 2019-01-24: qty 6.7

## 2019-01-24 MED ORDER — CHLORHEXIDINE GLUCONATE 0.12% ORAL RINSE (MEDLINE KIT)
15.0000 mL | Freq: Two times a day (BID) | OROMUCOSAL | Status: DC
  Administered 2019-01-24 – 2019-01-26 (×4): 15 mL via OROMUCOSAL

## 2019-01-24 MED ORDER — LACTATED RINGERS IV SOLN
INTRAVENOUS | Status: DC
  Administered 2019-01-24 – 2019-01-25 (×3): via INTRAVENOUS

## 2019-01-24 MED ORDER — PROPOFOL 10 MG/ML IV BOLUS
40.0000 mg | Freq: Once | INTRAVENOUS | Status: AC
  Administered 2019-01-24: 40 mg via INTRAVENOUS

## 2019-01-24 MED ORDER — IBUPROFEN 400 MG PO TABS: 600.0000 mg | ORAL_TABLET | Freq: Once | ORAL | Status: DC

## 2019-01-24 MED ORDER — ALBUTEROL (5 MG/ML) CONTINUOUS INHALATION SOLN
10.0000 mg/h | INHALATION_SOLUTION | Freq: Once | RESPIRATORY_TRACT | Status: AC
  Administered 2019-01-24: 10 mg/h via RESPIRATORY_TRACT

## 2019-01-24 MED ORDER — MIDAZOLAM HCL 5 MG/5ML IJ SOLN
INTRAMUSCULAR | Status: AC
  Administered 2019-01-24: 4 mg via INTRAVENOUS
  Filled 2019-01-24: qty 5

## 2019-01-24 MED ORDER — MAGNESIUM SULFATE 2 GM/50ML IV SOLN
2.0000 g | Freq: Once | INTRAVENOUS | Status: AC
  Administered 2019-01-24: 2 g via INTRAVENOUS
  Filled 2019-01-24: qty 50

## 2019-01-24 MED ORDER — SODIUM CHLORIDE 0.9 % IV SOLN
1.0000 g | Freq: Once | INTRAVENOUS | Status: AC
  Administered 2019-01-24: 1 g via INTRAVENOUS
  Filled 2019-01-24: qty 10

## 2019-01-24 MED ORDER — MIDAZOLAM HCL 5 MG/5ML IJ SOLN
4.0000 mg | Freq: Once | INTRAMUSCULAR | Status: AC
  Administered 2019-01-24: 4 mg via INTRAVENOUS

## 2019-01-24 MED ORDER — ORAL CARE MOUTH RINSE
15.0000 mL | OROMUCOSAL | Status: DC
  Administered 2019-01-24 – 2019-01-26 (×13): 15 mL via OROMUCOSAL

## 2019-01-24 MED ORDER — PROPOFOL 1000 MG/100ML IV EMUL
5.0000 ug/kg/min | INTRAVENOUS | Status: DC
  Administered 2019-01-24: 5 ug/kg/min via INTRAVENOUS
  Administered 2019-01-24: 80 ug/kg/min via INTRAVENOUS
  Filled 2019-01-24 (×2): qty 100

## 2019-01-24 MED ORDER — ALBUTEROL (5 MG/ML) CONTINUOUS INHALATION SOLN: 10.0000 mg/h | INHALATION_SOLUTION | Freq: Once | RESPIRATORY_TRACT | Status: DC

## 2019-01-24 MED ORDER — ALBUTEROL (5 MG/ML) CONTINUOUS INHALATION SOLN
10.0000 mg/h | INHALATION_SOLUTION | Freq: Once | RESPIRATORY_TRACT | Status: AC
  Administered 2019-01-24: 10 mg/h via RESPIRATORY_TRACT
  Filled 2019-01-24: qty 20

## 2019-01-24 MED ORDER — PROPOFOL 1000 MG/100ML IV EMUL
INTRAVENOUS | Status: AC
  Filled 2019-01-24: qty 100

## 2019-01-24 MED ORDER — FENTANYL BOLUS VIA INFUSION
50.0000 ug | INTRAVENOUS | Status: DC | PRN
  Administered 2019-01-25: 50 ug via INTRAVENOUS
  Filled 2019-01-24 (×2): qty 50

## 2019-01-24 NOTE — H&P (Signed)
NAME:  Jaime [name], MRN:  [id number], DOB:  November 23, 1985, LOS: 0 ADMISSION DATE:  01/24/2019, CONSULTATION DATE:  3/24 REFERRING MD:  knapp, CHIEF COMPLAINT:  Acute respiratory failure    Brief History   33 year old female w/ h/o asthma, NASH and cirrhosis. Admitted to [facility] 3/24 w/ CAP and asthmatic exacerbation. (transferred d/t COVID-19 rule out)  History of present illness   33 year old female presented to ED at [facility] 3/23 w/ cc: cough and wheezing. Onset 3/22, started w/ some exertional SOB, awoke w/ cough and wheeze. Pulse ox check at home 70s. Cough NP, runny nose. No chills or sweats. No travel. No sick exposures.  In ER: CXR left focal consolidation in base; lactic acid 5.9, influenza neg. Started on empiric abx; 3/24: O2 requirements increased. WOB worse.  LA worse, IVFs administered. Intubated. Transferred to [facility] after COVID-19 test panel sent.  Past Medical History  Asthma/COPD (no PFTs), Hep C and NASH (MELD 7 as of 08/24/18), cirrhosis, chronic pain w/ narcotic dependence on methadone, IDDM, prior admits for volume overload  Requiring aggressive diuresis  Significant Hospital Events   3/23 presented to ED at [place] w/ asthmatic exacerbation and LLL CAP. IVFs and ABX started.  3/24 increased WOB. LA increased from 5.9 to 8.7; intubated. COVID-19 sent. Transferred to [facility] as increased to high acuity rule-out.   Consults:   Procedures:  OETT 3/24  Significant Diagnostic Tests:    Micro Data:  Influenza A&B 3/23: neg BCX2 3/24>>> Respiratory culture 3/24>>> RVP 3/24>>> Urine strep 3/24>>> Urine legionella antigen 3/24>>>  Antimicrobials:  Ctx 3/23>>> azith 3/23>>>  Interim history/subjective:  Now arrived at [facility] from [facility]   Objective   Blood pressure (Abnormal) 126/55, pulse (Abnormal) 128, temperature (Abnormal) 102.7 F (39.3 C), temperature source Rectal, resp. rate (Abnormal) 24, height 5' 10"  (1.778 m), weight 122.1 kg, SpO2 100 %.    Vent Mode: PRVC FiO2 (%):   [40 %-100 %] 100 % Set Rate:  [10 bmp-18 bmp] 18 bmp Vt Set:  [540 mL] 540 mL PEEP:  [5 cmH20] 5 cmH20   Intake/Output Summary (Last 24 hours) at 01/24/2019 1053 Last data filed at 01/24/2019 0944 Gross per 24 hour  Intake 1094.95 ml  Output no documentation  Net 1094.95 ml   Filed Weights   01/24/19 0338  Weight: 122.1 kg    Examination:  General:  In bed on vent HENT: NCAT ETT in place PULM: Crackles basesB, vent supported breathing CV: RRR, no mgr GI: BS+, soft, nontender MSK: normal bulk and tone Neuro: sedated on vent    Resolved Hospital Problem list      Assessment & Plan:  Acute Hypoxic respiratory failure in setting of asthmatic exacerbation and LLL PNA.  Now with worsening bilateral infiltrates worrisome for ARDS PCXR personally reviewed demonstrates LLL consolidation.  Lack of lymphopenia and clinical history more consistent pneumonia, so don't think this is COVID19 but agree that we should rule out  Plan Cont full vent support PAD protocol; RASS goal -2 (avoid benzos w/ h/o cirrhosis) VAP bundle Scheduled MDIs (COVID r/o so try to limit aerosol rx) Systemic steroids (solumedrol 43m q 12) Day 2 azith and rocephin F/u RVP and COVID-19 Send sputum culture  Send urine strep and legionella antigen    Severe sepsis 2/2 PNA Plan Repeat LA on arrival to ICU abx per above Cont MIVFs Pressors as indicated for MAP > 65  Lactic acidosis.  ? 2/2 sepsis or work of breathing. 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 Plan Ck LFTs in am Ck INR  Chronic thrombocytopenia 2/2 cirrhosis  Plan SCDs Trend CBC  H/o chronic pain and narcotic dependence Plan PAD per above  DM w/ hyperglycemia Plan SSI low threshold for gtt Send beta-hydroxy ureic acid   Best practice:  Diet: NPO Pain/Anxiety/Delirium protocol (if indicated): 3/24 VAP protocol (if  indicated): 3/24 DVT prophylaxis: scd (PLTs 65 on admit) GI prophylaxis: PPI Glucose control: SSI Mobility: BR Code Status: full code  Family Communication: pending  Disposition:  Critically ill due to acute respiratory failure requiring titration of mechanical ventilation, PEEP and FIO2 therapy,   Labs   CBC: Recent Labs  Lab 01/24/19 0505  WBC 3.2*  NEUTROABS 2.4  HGB 14.0  HCT 43.5  MCV 89.3  PLT 65*    Basic Metabolic Panel: Recent Labs  Lab 01/24/19 0505  NA 135  K 3.6  CL 100  CO2 22  GLUCOSE 242*  BUN 8  CREATININE 0.62  CALCIUM 8.6*   GFR: Estimated Creatinine Clearance: 140.6 mL/min (by C-G formula based on SCr of 0.62 mg/dL). Recent Labs  Lab 01/24/19 0505 01/24/19 0915  WBC 3.2*  --   LATICACIDVEN 5.9* 8.7*    Liver Function Tests: Recent Labs  Lab 01/24/19 0505  AST 33  ALT 27  ALKPHOS 92  BILITOT 1.9*  PROT 7.2  ALBUMIN 3.6   No results for input(s): LIPASE, AMYLASE in the last 168 hours. No results for input(s): AMMONIA in the last 168 hours.  ABG    Component Value Date/Time   PHART 7.258 (L) 01/24/2019 0930   PCO2ART 35.7 01/24/2019 0930   PO2ART 151 (H) 01/24/2019 0930   HCO3 16.2 (L) 01/24/2019 0930   TCO2 21.8 08/21/2010 1440   ACIDBASEDEF 10.3 (H) 01/24/2019 0930   O2SAT 98.6 01/24/2019 0930     Coagulation Profile: No results for input(s): INR, PROTIME in the last 168 hours.  Cardiac Enzymes: No results for input(s): CKTOTAL, CKMB, CKMBINDEX, TROPONINI in the last 168 hours.  HbA1C: No results found for: HGBA1C  CBG: Recent Labs  Lab 01/23/19 1813  GLUCAP 243*    Review of Systems:   N/A  Past Medical History  She,  has a past medical history of Anxiety, Chronic abdominal pain, Chronic back pain, Cirrhosis ([place]), COPD (chronic obstructive pulmonary disease) ([place]), Depression, Diabetes mellitus without complication ([place]), Hepatitis C, Insomnia, Long-term current use of methadone for opiate dependence  ([place]), Migraine headache, and Peptic ulcer.   Surgical History    Past Surgical History:  Procedure Laterality Date  . CHOLECYSTECTOMY       Social History   reports that she has quit smoking. She smoked 0.00 packs per day for 5.00 years. She has never used smokeless tobacco. She reports that she does not drink alcohol or use drugs.   Family History   Her family history includes Hyperlipidemia in an other family member; Hypertension in her mother.   Allergies Allergies  Allergen Reactions  . Iodine-131 Anaphylaxis, Shortness Of Breath and Swelling  . Ivp Dye [Iodinated Diagnostic Agents] Anaphylaxis    Skin gets very red, unable to walk   . Ketorolac Tromethamine Shortness Of Breath  . Tylenol [Acetaminophen] Other (See Comments)    Due to cirrhosis   . Nsaids Other (See Comments)    Flares ulcers  . Vancomycin Swelling  Facial swelling,      Home Medications  Prior to Admission medications   Medication Sig Start Date End Date Taking? Authorizing Provider  albuterol (PROVENTIL) (2.5 MG/3ML) 0.083% nebulizer solution Take 2.5 mg by nebulization every 6 (six) hours as needed for wheezing or shortness of breath.    [provider]  cetirizine (ZYRTEC) 10 MG tablet Take 10 mg by mouth every evening.    [provider]  clonazePAM (KLONOPIN) 1 MG tablet Take 1 mg by mouth 4 (four) times daily. 01/07/19   [provider]  diclofenac sodium (VOLTAREN) 1 % GEL Apply 2 g topically 4 (four) times daily as needed (pain).  08/13/18   [provider]  doxycycline (VIBRAMYCIN) 100 MG capsule Take 1 capsule (100 mg total) by mouth 2 (two) times daily. 01/23/19   Lily Kocher, PA-C  etonogestrel (NEXPLANON) 68 MG IMPL implant 1 each by Subdermal route once.    [provider]  fluticasone furoate-vilanterol (BREO ELLIPTA) 100-25 MCG/INH AEPB Inhale 1 puff into the lungs daily.    [provider]  HYDROcodone-homatropine (HYCODAN) 5-1.5  MG/5ML syrup Take 5 mLs by mouth every 6 (six) hours as needed. 01/23/19   Lily Kocher, PA-C  insulin NPH-regular Human (70-30) 100 UNIT/ML injection Inject 0-6 Units into the skin 3 (three) times daily. Per sliding scale  200= 2 units 250= 4 units 350= 6 units 400= Go to  ER 08/24/18   [provider]  insulin regular (NOVOLIN R,HUMULIN R) 100 units/mL injection Inject 20 Units into the skin 2 (two) times daily before a meal.    [provider]  lactulose (CHRONULAC) 10 GM/15ML solution Take 15 mLs by mouth 2 (two) times daily as needed for mild constipation or moderate constipation.  08/24/18   [provider]  Menthol (BIOFREEZE) 10 % AERO Apply 1 application topically as needed (pain).    [provider]  metFORMIN (GLUCOPHAGE-XR) 500 MG 24 hr tablet Take 1,000 mg by mouth 2 (two) times daily.    [provider]  methadone (DOLOPHINE) 10 MG tablet Take 2 tablets (20 mg total) by mouth every 12 (twelve) hours. Patient taking differently: Take 50 mg by mouth at bedtime.  07/20/16   Sinda Du, MD  pantoprazole (PROTONIX) 40 MG tablet Take 40 mg by mouth every morning.     [provider]  potassium chloride SA (K-DUR,KLOR-CON) 20 MEQ tablet Take 20-40 tablets by mouth daily as needed (for low potassium).  07/28/18   [provider]  prochlorperazine (COMPAZINE) 10 MG tablet Take 10 mg by mouth 4 (four) times daily as needed for nausea or vomiting.  12/08/18   [provider]  promethazine (PHENERGAN) 25 MG tablet Take 25 mg by mouth daily as needed for nausea or vomiting.    [provider]  spironolactone (ALDACTONE) 50 MG tablet Take 50-100 tablets by mouth daily as needed (for fluid).  09/09/18   [provider]  sucralfate (CARAFATE) 1 g tablet Take 1 g by mouth 4 (four) times daily as needed (ulcers).     [provider]  Suvorexant (BELSOMRA) 20 MG TABS Take 20 mg by mouth at bedtime.      [provider]  tetrahydrozoline 0.05 % ophthalmic solution Place 2 drops into both eyes 2 (two) times daily as needed (allergies/dry/itchy).    [provider]  torsemide (DEMADEX) 20 MG tablet Take 20-40 tablets by mouth daily as needed (for fluid).     [provider]  vortioxetine HBr (TRINTELLIX) 20 MG TABS tablet Take 20 mg by mouth every evening.     [provider]  zolpidem (AMBIEN) 10 MG tablet Take 1 tablet by mouth at bedtime.  03/14/18   [provider]     Critical care time: 33 min     Roselie Awkward, MD [facility] Pager: [phone] Cell: [phone] If no response, call [phone]

## 2019-01-24 NOTE — ED Triage Notes (Signed)
Pt states she was just seen here today for the same complaint and diagnosed with pneumonia; pt states her breathing has gotten worse

## 2019-01-24 NOTE — Procedures (Signed)
Central Venous Catheter Insertion Procedure Note AUDEN WETTSTEIN [id number] 19-May-1986  Procedure: Insertion of Central Venous Catheter Indications: Assessment of intravascular volume  Procedure Details Consent: Risks of procedure as well as the alternatives and risks of each were explained to the (patient/caregiver).  Consent for procedure obtained. Time Out: Verified patient identification, verified procedure, site/side was marked, verified correct patient position, special equipment/implants available, medications/allergies/relevent history reviewed, required imaging and test results available.  Performed  Maximum sterile technique was used including antiseptics, cap, gloves, gown, hand hygiene, mask and sheet. Skin prep: Chlorhexidine; local anesthetic administered A antimicrobial bonded/coated triple lumen catheter was placed in the right internal jugular vein using the Seldinger technique.  Ultrasound was used to verify the patency of the vein and for real time needle guidance.  Evaluation Blood flow good Complications: No apparent complications Patient did tolerate procedure well. Chest X-ray ordered to verify placement.  CXR: pending.  Simonne Maffucci 01/24/2019, 3:10 PM

## 2019-01-24 NOTE — Progress Notes (Signed)
[facility] Progress Note Patient Name: CHINENYE KATZENBERGER DOB: 10/21/1986 MRN: [id number]   Date of Service  01/24/2019  HPI/Events of Note  RN called latest lactate which is improved from previous baseline, I took the opportunity to review patient's labs and do a camera check.  eICU Interventions  Labs review/ camera check completed.        Kerry Kass Jaser Fullen 01/24/2019, 8:30 PM

## 2019-01-24 NOTE — ED Notes (Addendum)
Pt placed on 6L of O2 via [place] by Dr. Tomi Bamberger. Pt continues to have labored breathing on the [place].

## 2019-01-24 NOTE — ED Provider Notes (Signed)
[facility] Provider Note   CSN: [id number] Arrival date & time: 01/24/19  0330  Time seen 3:48 AM  History   Chief Complaint Chief Complaint  Patient presents with  . Shortness of Breath    HPI Jaime [name] is a 33 y.o. female.     HPI patient has a history of reactive airway disease and states she gets pneumonia every 1 to 2 years and she is usually admitted to the hospital.  She also has oxygen at home however she had some loss of insurance and she has not used her oxygen since November.  She states she mainly used it at night.  She states at midnight, a little over 24 hours ago she started having a dry cough.  She is unsure if fever but states she feels hot and she is cold all the time but denies chills.  She states she has been wheezing.  She complains of a central chest pain that is a tightness and pressure feeling.  She has had clear rhinorrhea without sneezing.  She denies sore throat, vomiting, or diarrhea.  She states this feels different from her asthma flareups but feels more like when she gets pneumonia.  She states she has been using her nebulizer and inhalers at home and they are not helping at all.  She states she has been on prednisone in the past.  PCP Sinda Du, MD   Past Medical History:  Diagnosis Date  . Anxiety   . Chronic abdominal pain   . Chronic back pain   . Cirrhosis ([place])   . COPD (chronic obstructive pulmonary disease) ([place])   . Depression   . Diabetes mellitus without complication ([place])   . Hepatitis C   . Insomnia   . Long-term current use of methadone for opiate dependence ([place])   . Migraine headache   . Peptic ulcer     Patient Active Problem List   Diagnosis Date Noted  . Uncontrolled diabetes mellitus ([place]) 09/19/2018  . Hyperglycemia 09/17/2018  . Acute respiratory failure with hypoxia ([place]) 09/15/2018  . Anxiety 09/15/2018  . Chronic abdominal pain 09/15/2018  . Diabetes mellitus without  complication ([place]) 77/82/4235  . Hepatitis C 09/15/2018  . Peptic ulcer 09/15/2018  . Long-term current use of methadone for opiate dependence ([place]) 09/15/2018  . Cirrhosis ([place]) 09/15/2018  . CAP (community acquired pneumonia) 07/17/2016  . Acute hypoxemic respiratory failure ([place]) 07/17/2016  . Pleural effusion on right 07/17/2016  . Multiple rib fractures involving four or more ribs 07/17/2016  . Hemothorax on right 07/17/2016  . Chronic pain disorder 07/17/2016    Past Surgical History:  Procedure Laterality Date  . CHOLECYSTECTOMY       OB History    Gravida      Para      Term      Preterm      AB      Living  0     SAB      TAB      Ectopic      Multiple      Live Births               Home Medications    Prior to Admission medications   Medication Sig Start Date End Date Taking? Authorizing Provider  albuterol (PROVENTIL) (2.5 MG/3ML) 0.083% nebulizer solution Take 2.5 mg by nebulization every 6 (six) hours as needed for wheezing or shortness of breath.    [provider]  cetirizine (ZYRTEC) 10 MG tablet Take 10 mg by mouth every evening.    [provider]  clonazePAM (KLONOPIN) 1 MG tablet Take 1 mg by mouth 4 (four) times daily. 01/07/19   [provider]  diclofenac sodium (VOLTAREN) 1 % GEL Apply 2 g topically 4 (four) times daily as needed (pain).  08/13/18   [provider]  doxycycline (VIBRAMYCIN) 100 MG capsule Take 1 capsule (100 mg total) by mouth 2 (two) times daily. 01/23/19   Lily Kocher, PA-C  etonogestrel (NEXPLANON) 68 MG IMPL implant 1 each by Subdermal route once.    [provider]  fluticasone furoate-vilanterol (BREO ELLIPTA) 100-25 MCG/INH AEPB Inhale 1 puff into the lungs daily.    [provider]  HYDROcodone-homatropine (HYCODAN) 5-1.5 MG/5ML syrup Take 5 mLs by mouth every 6 (six) hours as needed. 01/23/19   Lily Kocher, PA-C  insulin NPH-regular Human (70-30) 100  UNIT/ML injection Inject 0-6 Units into the skin 3 (three) times daily. Per sliding scale  200= 2 units 250= 4 units 350= 6 units 400= Go to  ER 08/24/18   [provider]  insulin regular (NOVOLIN R,HUMULIN R) 100 units/mL injection Inject 20 Units into the skin 2 (two) times daily before a meal.    [provider]  lactulose (CHRONULAC) 10 GM/15ML solution Take 15 mLs by mouth 2 (two) times daily as needed for mild constipation or moderate constipation.  08/24/18   [provider]  Menthol (BIOFREEZE) 10 % AERO Apply 1 application topically as needed (pain).    [provider]  metFORMIN (GLUCOPHAGE-XR) 500 MG 24 hr tablet Take 1,000 mg by mouth 2 (two) times daily.    [provider]  methadone (DOLOPHINE) 10 MG tablet Take 2 tablets (20 mg total) by mouth every 12 (twelve) hours. Patient taking differently: Take 50 mg by mouth at bedtime.  07/20/16   Sinda Du, MD  pantoprazole (PROTONIX) 40 MG tablet Take 40 mg by mouth every morning.     [provider]  potassium chloride SA (K-DUR,KLOR-CON) 20 MEQ tablet Take 20-40 tablets by mouth daily as needed (for low potassium).  07/28/18   [provider]  prochlorperazine (COMPAZINE) 10 MG tablet Take 10 mg by mouth 4 (four) times daily as needed for nausea or vomiting.  12/08/18   [provider]  promethazine (PHENERGAN) 25 MG tablet Take 25 mg by mouth daily as needed for nausea or vomiting.    [provider]  spironolactone (ALDACTONE) 50 MG tablet Take 50-100 tablets by mouth daily as needed (for fluid).  09/09/18   [provider]  sucralfate (CARAFATE) 1 g tablet Take 1 g by mouth 4 (four) times daily as needed (ulcers).     [provider]  Suvorexant (BELSOMRA) 20 MG TABS Take 20 mg by mouth at bedtime.     [provider]  tetrahydrozoline 0.05 % ophthalmic solution Place 2 drops into both eyes 2 (two) times daily as needed  (allergies/dry/itchy).    [provider]  torsemide (DEMADEX) 20 MG tablet Take 20-40 tablets by mouth daily as needed (for fluid).     [provider]  vortioxetine HBr (TRINTELLIX) 20 MG TABS tablet Take 20 mg by mouth every evening.     [provider]  zolpidem (AMBIEN) 10 MG tablet Take 1 tablet by mouth at bedtime.  03/14/18   [provider]    Family History Family History  Problem Relation Age of  Onset  . Hypertension Mother   . Hyperlipidemia Other     Social History Social History   Tobacco Use  . Smoking status: Former Smoker    Packs/day: 0.00    Years: 5.00    Pack years: 0.00  . Smokeless tobacco: Never Used  Substance Use Topics  . Alcohol use: No  . Drug use: No     Allergies   Iodine-131; Ivp dye [iodinated diagnostic agents]; Ketorolac tromethamine; Tylenol [acetaminophen]; Nsaids; and Vancomycin   Review of Systems Review of Systems  All other systems reviewed and are negative.    Physical Exam Updated Vital Signs BP (!) 116/58   Pulse (!) 141   Temp (!) 102.7 F (39.3 C) (Rectal)   Resp (!) 32   Ht 5' 10"  (1.778 m)   Wt 122.1 kg   SpO2 96%   BMI 38.62 kg/m   Vital signs normal except tachycardia, tachypnea, fever   Physical Exam Vitals signs and nursing note reviewed.  Constitutional:      Appearance: She is well-developed. She is obese.  HENT:     Head: Normocephalic and atraumatic.     Right Ear: External ear normal.     Left Ear: External ear normal.     Nose: Nose normal.     Mouth/Throat:     Comments: Pt wearing a mask Eyes:     Extraocular Movements: Extraocular movements intact.     Conjunctiva/sclera: Conjunctivae normal.     Pupils: Pupils are equal, round, and reactive to light.  Neck:     Musculoskeletal: Normal range of motion and neck supple.  Cardiovascular:     Rate and Rhythm: Tachycardia present.  Pulmonary:     Effort: Tachypnea, prolonged expiration and respiratory  distress present.     Breath sounds: Examination of the right-lower field reveals rales. Examination of the left-lower field reveals rales. Decreased breath sounds and rales present. No wheezing.  Neurological:     Mental Status: She is alert.      ED Treatments / Results  Labs (all labs ordered are listed, but only abnormal results are displayed) Results for orders placed or performed during the hospital encounter of 01/24/19  Culture, blood (routine x 2)  Result Value Ref Range   Specimen Description RIGHT ANTECUBITAL    Special Requests      BOTTLES DRAWN AEROBIC AND ANAEROBIC Blood Culture adequate volume   Culture      NO GROWTH <12 HOURS Performed at [facility], [address] [place], [place] [address]    Report Status PENDING   Culture, blood (routine x 2)  Result Value Ref Range   Specimen Description BLOOD LEFT HAND    Special Requests      BOTTLES DRAWN AEROBIC AND ANAEROBIC Blood Culture adequate volume   Culture      NO GROWTH <12 HOURS Performed at [facility], [address] [place], [place] [address]    Report Status PENDING   Comprehensive metabolic panel  Result Value Ref Range   Sodium 135 135 - 145 mmol/L   Potassium 3.6 3.5 - 5.1 mmol/L   Chloride 100 98 - 111 mmol/L   CO2 22 22 - 32 mmol/L   Glucose, Bld 242 (H) 70 - 99 mg/dL   BUN 8 6 - 20 mg/dL   Creatinine, Ser 0.62 0.44 - 1.00 mg/dL   Calcium 8.6 (L) 8.9 - 10.3 mg/dL   Total Protein 7.2 6.5 - 8.1 g/dL   Albumin 3.6 3.5 -  5.0 g/dL   AST 33 15 - 41 U/L   ALT 27 0 - 44 U/L   Alkaline Phosphatase 92 38 - 126 U/L   Total Bilirubin 1.9 (H) 0.3 - 1.2 mg/dL   GFR calc non Af Amer >60 >60 mL/min   GFR calc Af Amer >60 >60 mL/min   Anion gap 13 5 - 15  CBC with Differential  Result Value Ref Range   WBC 3.2 (L) 4.0 - 10.5 K/uL   RBC 4.87 3.87 - 5.11 MIL/uL   Hemoglobin 14.0 12.0 - 15.0 g/dL   HCT 43.5 36.0 - 46.0 %   MCV 89.3 80.0 - 100.0 fL   MCH 28.7 26.0 - 34.0 pg   MCHC 32.2 30.0 -  36.0 g/dL   RDW 13.7 11.5 - 15.5 %   Platelets 65 (L) 150 - 400 K/uL   nRBC 0.0 0.0 - 0.2 %   Neutrophils Relative % 73 %   Neutro Abs 2.4 1.7 - 7.7 K/uL   Lymphocytes Relative 20 %   Lymphs Abs 0.7 0.7 - 4.0 K/uL   Monocytes Relative 5 %   Monocytes Absolute 0.2 0.1 - 1.0 K/uL   Eosinophils Relative 1 %   Eosinophils Absolute 0.0 0.0 - 0.5 K/uL   Basophils Relative 1 %   Basophils Absolute 0.0 0.0 - 0.1 K/uL   WBC Morphology INCREASED BANDS (>20% BANDS)    Immature Granulocytes 0 %   Abs Immature Granulocytes 0.00 0.00 - 0.07 K/uL  Influenza panel by PCR (type A & B)  Result Value Ref Range   Influenza A By PCR NEGATIVE NEGATIVE   Influenza B By PCR NEGATIVE NEGATIVE  Lactic acid, plasma  Result Value Ref Range   Lactic Acid, Venous 5.9 (HH) 0.5 - 1.9 mmol/L  Blood gas, venous  Result Value Ref Range   FIO2 40.00    pH, Ven 7.388 7.250 - 7.430   pCO2, Ven 32.7 (L) 44.0 - 60.0 mmHg   pO2, Ven 81.6 (H) 32.0 - 45.0 mmHg   Bicarbonate 20.8 20.0 - 28.0 mmol/L   Acid-base deficit 4.8 (H) 0.0 - 2.0 mmol/L   O2 Saturation 95.7 %   Patient temperature 39.2    Laboratory interpretation all normal except chronic thrombocytopenia, hx of leukopenia in September, appropriate respiratory alkalosis, hyperglycemia, low total white blood cell count without neutropenia however she does have over 20% bands and left shift consistent with bacterial infection.  Positive lactic acidosis.   EKG None  Radiology Dg Chest 2 View  Result Date: 01/23/2019 CLINICAL DATA:  Shortness of breath with cough and wheezing EXAM: CHEST - 2 VIEW COMPARISON:  September 23, 2018 FINDINGS: There is a focal area of consolidation in the left base. The lungs elsewhere are clear. Heart size and pulmonary vascularity are normal. No adenopathy. No bone lesions. IMPRESSION: Focal left base consolidation, likely pneumonia. Lungs elsewhere clear. Cardiac silhouette within normal limits. Electronically Signed   By: Lowella Grip III M.D.   On: 01/23/2019 18:26   Dg Chest Port 1 View  Result Date: 01/24/2019 CLINICAL DATA:  Pneumonia EXAM: PORTABLE CHEST 1 VIEW COMPARISON:  01/23/2019 FINDINGS: There is shallow lung inflation with bibasilar atelectasis. Previously described left basilar consolidation is less clearly demonstrated on this study perhaps due to hypoinflation. No pneumothorax. IMPRESSION: Shallow lung inflation and bibasilar atelectasis. Previously demonstrated left basilar consolidation is poorly visualized on the current study possibly due to underinflation. Electronically Signed   By: Cletus Gash.D.  On: 01/24/2019 04:39    Procedures Procedure Name: Intubation Date/Time: 01/24/2019 8:37 AM Performed by: Rolland Porter, MD Pre-anesthesia Checklist: Patient identified, Patient being monitored, Emergency Drugs available, Timeout performed and Suction available Oxygen Delivery Method: Nasal cannula Induction Type: Rapid sequence Laryngoscope Size: Glidescope and 3 Grade View: Grade II Tube type: Subglottic suction tube Tube size: 7.5 mm Number of attempts: 1 Placement Confirmation: ETT inserted through vocal cords under direct vision,  Positive ETCO2 and Breath sounds checked- equal and bilateral Secured at: 23 cm Tube secured with: ETT holder     .Critical Care Performed by: Rolland Porter, MD Authorized by: Rolland Porter, MD   Critical care provider statement:    Critical care time (minutes):  31   Critical care was necessary to treat or prevent imminent or life-threatening deterioration of the following conditions:  Respiratory failure   Critical care was time spent personally by me on the following activities:  Discussions with consultants, examination of patient, obtaining history from patient or surrogate, ordering and review of laboratory studies, ordering and review of radiographic studies, pulse oximetry, re-evaluation of patient's condition and review of old charts   (including  critical care time)  Medications Ordered in ED Medications  azithromycin (ZITHROMAX) 500 mg in sodium chloride 0.9 % 250 mL IVPB (0 mg Intravenous Stopped 01/24/19 0631)  sodium chloride 0.9 % bolus 1,000 mL (has no administration in time range)  propofol (DIPRIVAN) 1000 MG/100ML infusion (15 mcg/kg/min  122.1 kg Intravenous Rate/Dose Change 01/24/19 0928)  etomidate (AMIDATE) injection 20 mg (has no administration in time range)  succinylcholine (ANECTINE) injection 150 mg (has no administration in time range)  midazolam (VERSED) 5 MG/5ML injection 4 mg (has no administration in time range)  midazolam (VERSED) 50 mg in sodium chloride 0.9 % 50 mL (1 mg/mL) infusion (has no administration in time range)  magnesium sulfate IVPB 2 g 50 mL (0 g Intravenous Stopped 01/24/19 0631)  albuterol (PROVENTIL,VENTOLIN) solution continuous neb (10 mg/hr Nebulization Given 01/24/19 0436)  cefTRIAXone (ROCEPHIN) 1 g in sodium chloride 0.9 % 100 mL IVPB ( Intravenous Stopped 01/24/19 0703)  acetaminophen (TYLENOL) tablet 1,000 mg (1,000 mg Oral Given 01/24/19 0503)  albuterol (PROVENTIL,VENTOLIN) solution continuous neb (10 mg/hr Nebulization Given 01/24/19 0657)  sodium chloride 0.9 % bolus 1,000 mL (1,000 mLs Intravenous New Bag/Given 01/24/19 0811)     Initial Impression / Assessment and Plan / ED Course  I have reviewed the triage vital signs and the nursing notes.  Pertinent labs & imaging results that were available during my care of the patient were reviewed by me and considered in my medical decision making (see chart for details).   I was gowned, wearing a face shield and a mask with gloves.   Patient does not have any risk factors for covid-19.  She has an isolated pneumonia in her left lower lung, she has a history of known asthma with frequent pneumonia.  She was given albuterol nebulizer.  When I review her last lab work which was October 02, 2018 her BUN was 5 and creatinine was 0.52.  She was  given IV magnesium.  Patient was placed in respiratory isolation and she was in a negative pressure room.  06:35 AM Dr Olevia Bowens will admit to step down, agrees with Bipap, low risk for Covid-19 and will admit to negative pressure room   Respiratory therapy states they put a closed Bipap system on patient.   6:40 AM Dr. Olevia Bowens called back, the house supervisor will not  let him use a negative pressure room without the results of the respiratory panel which will not be back for a short while.  Her influenza has come back negative.  He still wants her to be treated for community-acquired pneumonia.  7:50 AM Dr. Wynetta Emery, hospitalist.  Discussed the patient.  His concern is that she probably needs to be called out for coronavirus.  I am going to go check the patient she has been on BiPAP now for around an hour and see if she can come off of it.  I am also go have the nursing staff talked to [facility] to see if they can transport the patient with a closed BiPAP system.  Recheck at 7:55 AM, I was wearing CAPR, patient is on BiPAP, she has almost finished her second continuous nebulizer.  She is still tachypneic with respiratory rate in the high 30s, she is tachycardic with heart rate 130-140 range.  when I listen to her lungs are clear.  She is requesting to come off the BiPAP.  She was taken off BiPAP and placed on nasal cannula oxygen at 6 L/min.  Her pulse ox was 96% however she still was very tachypneic and having some nasal flaring.  At this point it was felt patient would need to be intubated.  Respiratory therapy was contacted and patient prepared for intubation.  Patient was intubated and placed on sedation.  Covid-19  testing was ordered.  09:10 AM Dr Halford Chessman, PCCM has accepted patient in transfer to [facility] to ICU  Final Clinical Impressions(s) / ED Diagnoses   Final diagnoses:  Community acquired pneumonia of left lower lobe of lung ([place])  Respiratory distress  Lactic acidosis    Plan admission   Rolland Porter, MD, Barbette Or, MD 01/24/19 [phone]

## 2019-01-24 NOTE — Progress Notes (Signed)
Inpatient Diabetes Program Recommendations  AACE/ADA: New Consensus Statement on Inpatient Glycemic Control (2015)  Target Ranges:  Prepandial:   less than 140 mg/dL      Peak postprandial:   less than 180 mg/dL (1-2 hours)      Critically ill patients:  140 - 180 mg/dL   Lab Results  Component Value Date   GLUCAP 240 (H) 01/24/2019    Review of Glycemic Control  Diabetes history: DM2 Outpatient Diabetes medications: 70/30 20 units bid, Novolin R 2-6 tidwc Current orders for Inpatient glycemic control: Novolog 0-20 units Q4H.  On Solumedrol 40 mg Q12H.  Inpatient Diabetes Program Recommendations:     May benefit from ICU Glycemic Control Orders.  Follow closely.  Thank you. Lorenda Peck, RD, LDN, CDE Inpatient Diabetes Coordinator [phone]

## 2019-01-24 NOTE — ED Notes (Signed)
CRITICAL VALUE ALERT  Critical Value:  Lactic Acid 5.9  Date & Time Notied:  01/24/19 0710  Provider Notified: Dr. Tomi Bamberger  Orders Received/Actions taken: EDP notified

## 2019-01-24 NOTE — ED Notes (Signed)
Date and time results received: 01/24/19 @09 :55 (use smartphrase ".now" to insert current time)  Test: lactic acid Critical Value:8.7  Name of Provider Notified: Dr Rogene Houston   Orders Received? Or Actions Taken?:  No additional orders given,

## 2019-01-24 NOTE — Progress Notes (Signed)
[facility]  For clarity, re-ordering COVID19 from trach aspirate.  Roselie Awkward, MD [place] [facility] Pager: [phone] Cell: [phone] If no response, call [phone]

## 2019-01-24 NOTE — Progress Notes (Signed)
CRITICAL VALUE ALERT  Critical Value:  Lactic 3.6   Date & Time Notied:  01/24/2019 15:57  Provider Notified: Marni Griffon   Orders Received/Actions taken: NP

## 2019-01-24 NOTE — ED Notes (Signed)
[facility] at bedside to transport pt to [facility] ICU.

## 2019-01-24 NOTE — Progress Notes (Addendum)
CRITICAL VALUE ALERT  Critical Value: lactic acid 2.6  Date & Time Notied: 01/24/19 1940  Provider Notified: [facility] called and notified  Orders Received/Actions taken: will continue to monitor

## 2019-01-24 NOTE — ED Notes (Signed)
Date and time results received: 01/24/19 07:10 (use smartphrase ".now" to insert current time)  Test: lactic acid 5.9 Critical Value:   Name of Provider Notified: Dr Tomi Bamberger  Orders Received? Or Actions Taken?: no additional orders given,

## 2019-01-25 ENCOUNTER — Inpatient Hospital Stay (HOSPITAL_COMMUNITY): Payer: Medicaid Other

## 2019-01-25 DIAGNOSIS — J9602 Acute respiratory failure with hypercapnia: Secondary | ICD-10-CM

## 2019-01-25 DIAGNOSIS — J189 Pneumonia, unspecified organism: Secondary | ICD-10-CM

## 2019-01-25 DIAGNOSIS — J9601 Acute respiratory failure with hypoxia: Secondary | ICD-10-CM

## 2019-01-25 LAB — GLUCOSE, CAPILLARY
GLUCOSE-CAPILLARY: 272 mg/dL — AB (ref 70–99)
Glucose-Capillary: 286 mg/dL — ABNORMAL HIGH (ref 70–99)
Glucose-Capillary: 304 mg/dL — ABNORMAL HIGH (ref 70–99)
Glucose-Capillary: 276 mg/dL — ABNORMAL HIGH (ref 70–99)
Glucose-Capillary: 305 mg/dL — ABNORMAL HIGH (ref 70–99)
Glucose-Capillary: 330 mg/dL — ABNORMAL HIGH (ref 70–99)

## 2019-01-25 LAB — PHOSPHORUS
Phosphorus: 2.1 mg/dL — ABNORMAL LOW (ref 2.5–4.6)
Phosphorus: 2.2 mg/dL — ABNORMAL LOW (ref 2.5–4.6)
Phosphorus: 2.7 mg/dL (ref 2.5–4.6)

## 2019-01-25 LAB — BLOOD GAS, ARTERIAL
Acid-base deficit: 2.6 mmol/L — ABNORMAL HIGH (ref 0.0–2.0)
Bicarbonate: 22.5 mmol/L (ref 20.0–28.0)
DRAWN BY: 225631
FIO2: 60
MECHVT: 0.54 mL
O2 Saturation: 97.4 %
PEEP: 8 cmH2O
Patient temperature: 98.9
RATE: 18 resp/min
pCO2 arterial: 43.1 mmHg (ref 32.0–48.0)
pH, Arterial: 7.339 — ABNORMAL LOW (ref 7.350–7.450)
pO2, Arterial: 95.2 mmHg (ref 83.0–108.0)

## 2019-01-25 LAB — CBC
HCT: 34.4 % — ABNORMAL LOW (ref 36.0–46.0)
Hemoglobin: 11.1 g/dL — ABNORMAL LOW (ref 12.0–15.0)
MCH: 29.1 pg (ref 26.0–34.0)
MCHC: 32.3 g/dL (ref 30.0–36.0)
MCV: 90.1 fL (ref 80.0–100.0)
Platelets: 38 10*3/uL — ABNORMAL LOW (ref 150–400)
RBC: 3.82 MIL/uL — ABNORMAL LOW (ref 3.87–5.11)
RDW: 14 % (ref 11.5–15.5)
WBC: 3.1 10*3/uL — ABNORMAL LOW (ref 4.0–10.5)
nRBC: 0 % (ref 0.0–0.2)

## 2019-01-25 LAB — PROCALCITONIN: PROCALCITONIN: 4.81 ng/mL

## 2019-01-25 LAB — MAGNESIUM
MAGNESIUM: 2 mg/dL (ref 1.7–2.4)
Magnesium: 1.9 mg/dL (ref 1.7–2.4)
Magnesium: 2.1 mg/dL (ref 1.7–2.4)

## 2019-01-25 LAB — BASIC METABOLIC PANEL
Anion gap: 8 (ref 5–15)
BUN: 12 mg/dL (ref 6–20)
CO2: 20 mmol/L — ABNORMAL LOW (ref 22–32)
Calcium: 8 mg/dL — ABNORMAL LOW (ref 8.9–10.3)
Chloride: 105 mmol/L (ref 98–111)
Creatinine, Ser: 0.54 mg/dL (ref 0.44–1.00)
GFR calc Af Amer: >60 mL/min (ref >60)
GFR calc non Af Amer: >60 mL/min (ref >60)
Glucose, Bld: 297 mg/dL — ABNORMAL HIGH (ref 70–99)
POTASSIUM: 4.7 mmol/L (ref 3.5–5.1)
Sodium: 133 mmol/L — ABNORMAL LOW (ref 135–145)

## 2019-01-25 LAB — LACTIC ACID, PLASMA: Lactic Acid, Venous: 2.1 mmol/L (ref 0.5–1.9)

## 2019-01-25 LAB — MRSA PCR SCREENING: MRSA by PCR: NEGATIVE

## 2019-01-25 MED ORDER — INSULIN GLARGINE 100 UNIT/ML ~~LOC~~ SOLN
10.0000 [IU] | Freq: Every day | SUBCUTANEOUS | Status: DC
  Administered 2019-01-25: 10 [IU] via SUBCUTANEOUS
  Filled 2019-01-25: qty 0.1

## 2019-01-25 MED ORDER — INSULIN ASPART 100 UNIT/ML ~~LOC~~ SOLN
11.0000 [IU] | SUBCUTANEOUS | Status: DC
  Administered 2019-01-25 – 2019-01-26 (×5): 11 [IU] via SUBCUTANEOUS

## 2019-01-25 MED ORDER — VITAL HIGH PROTEIN PO LIQD
1000.0000 mL | ORAL | Status: DC
  Administered 2019-01-25 (×2): 1000 mL

## 2019-01-25 MED ORDER — ADULT MULTIVITAMIN LIQUID CH
15.0000 mL | Freq: Every day | ORAL | Status: DC
  Administered 2019-01-25 – 2019-01-26 (×2): 15 mL
  Filled 2019-01-25 (×4): qty 15

## 2019-01-25 MED ORDER — PRO-STAT SUGAR FREE PO LIQD
30.0000 mL | Freq: Two times a day (BID) | ORAL | Status: DC
  Filled 2019-01-25: qty 30

## 2019-01-25 MED ORDER — FREE WATER
200.0000 mL | Freq: Four times a day (QID) | Status: DC
  Administered 2019-01-25 – 2019-01-26 (×4): 200 mL

## 2019-01-25 MED ORDER — PRO-STAT SUGAR FREE PO LIQD
60.0000 mL | Freq: Three times a day (TID) | ORAL | Status: DC
  Administered 2019-01-25 – 2019-01-26 (×3): 60 mL
  Filled 2019-01-25 (×2): qty 60

## 2019-01-25 MED ORDER — PANTOPRAZOLE SODIUM 40 MG PO PACK
40.0000 mg | PACK | Freq: Every day | ORAL | Status: DC
  Administered 2019-01-25 – 2019-01-26 (×2): 40 mg
  Filled 2019-01-25 (×2): qty 20

## 2019-01-25 MED ORDER — PREDNISONE 5 MG/ML PO CONC
40.0000 mg | Freq: Every day | ORAL | Status: DC
  Administered 2019-01-25 – 2019-01-26 (×2): 40 mg
  Filled 2019-01-25 (×3): qty 8

## 2019-01-25 MED ORDER — SODIUM PHOSPHATES 45 MMOLE/15ML IV SOLN
20.0000 mmol | Freq: Once | INTRAVENOUS | Status: AC
  Administered 2019-01-25: 20 mmol via INTRAVENOUS
  Filled 2019-01-25: qty 6.67

## 2019-01-25 MED ORDER — VITAL HIGH PROTEIN PO LIQD: 1000.0000 mL | ORAL | Status: DC

## 2019-01-25 NOTE — Progress Notes (Addendum)
NAME:  Jaime [name], MRN:  [id number], DOB:  February 21, 1986, LOS: 1 ADMISSION DATE:  01/24/2019, CONSULTATION DATE:  3/24 REFERRING MD:  knapp, CHIEF COMPLAINT:  Acute respiratory failure    Brief History   33 year old female w/ h/o asthma, NASH and cirrhosis. Admitted to [facility] 3/24 w/ CAP and asthmatic exacerbation. (transferred d/t COVID-19 rule out)  Past Medical History  Asthma/COPD (no PFTs), Hep C and NASH (MELD 7 as of 08/24/18), cirrhosis, chronic pain w/ narcotic dependence on methadone, IDDM, prior admits for volume overload  Requiring aggressive diuresis  Significant Hospital Events   3/23 presented to ED at  w/ asthmatic exacerbation and LLL CAP. IVFs and ABX started.  3/24 increased WOB. LA increased from 5.9 to 8.7; intubated. COVID-19 sent. [facility] to [facility] as increased to high acuity rule-out.  3/25: Respiratory viral panel negative.  FiO2 and PEEP requirements have improved.  Hemodynamically stable.  X-ray improved some.  Starting tube feeds Consults:   Procedures:  OETT 3/24  Significant Diagnostic Tests:    Micro Data:  Influenza A&B 3/23: neg BCX2 3/24>>> Respiratory culture 3/24>>> RVP 3/24>>> negative Urine strep 3/24>>> negative Urine legionella antigen 3/24>>>  Antimicrobials:  Ctx 3/23>>> azith 3/23>>>  Interim history/subjective:  Weaning PEEP and FiO2 appears comfortable all cultures still pending  Objective   Blood pressure 140/80, pulse 100, temperature 98.4 F (36.9 C), resp. rate (Abnormal) 22, height 5' 10"  (1.778 m), weight 127.2 kg, SpO2 95 %.    Vent Mode: PRVC FiO2 (%):  [50 %-80 %] 50 % Set Rate:  [18 bmp] 18 bmp Vt Set:  [540 mL] 540 mL PEEP:  [8 cmH20] 8 cmH20 Plateau Pressure:  [17 cmH20-27 cmH20] 27 cmH20   Intake/Output Summary (Last 24 hours) at 01/25/2019 0956 Last data filed at 01/25/2019 4174 Gross per 24 hour  Intake 4300.16 ml  Output 1000 ml  Net 3300.16 ml   Filed Weights   01/24/19 0338 01/25/19 0436   Weight: 122.1 kg 127.2 kg    Examination:  General: This is an obese 33 year old female she is currently resting in bed and in no acute distress while sedated on propofol and fentanyl infusions HEENT normocephalic atraumatic orally intubated no JVD right internal jugular vein triple-lumen catheter dressing is intact Pulmonary: Coarse scattered rhonchi no wheezing audible today minimal PEEP down to 8, FiO2 down to 50% plateau pressures in mid 20s Cardiac: Regular rate and rhythm without murmur rub or gallop Abdomen: Soft, not tender positive bowel sounds GU: Clear yellow Neuro: She opens her eyes, will follow commands briskly.  Interactive Extremities: Warm and dry brisk capillary refill trace dependent edema    Resolved Hospital Problem list   Lactic acidosis   Assessment & Plan:  Acute Hypoxic respiratory failure in setting of asthmatic exacerbation, CAP with bilateral infiltrates worrisome for ARDS Portable chest x-ray personally reviewed: Comparing films from 3/24 to 3/25.  There continues to be bilateral airspace disease however aeration has improved somewhat when comparing serial films.  The tubes and lines are in satisfactory position. -PEEP and FiO2 requirements have improved  plan Continue full ventilator support PAD protocol RASS goal -2 using fentanyl and propofol Avoid benzodiazepines given history of cirrhosis VAP bundle Scheduled MDIs, trying to rule limit aerosol therapies Day #3 a azithromycin and Rocephin Change Solu-Medrol to prednisone via tube Follow-up sputum culture, blood culture, and COVID-19   Severe sepsis 2/2 PNA Plan KVO IV fluids Antibiotics per above   Fluid and electrolyte imbalance: Mild hypophosphatemia  Plan Replace and recheck in a.m.  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 complete metabolic count on 9/92  Chronic thrombocytopenia 2/2 cirrhosis  Her platelets have dropped from 65 on admission down to 38 on 3/25 Plan Continue to  hold anticoagulation Trend CBC Will ask pharmacy to evaluate current MAR to ensure current regimen not contributing to thrombocytopenia  H/o chronic pain and narcotic dependence Plan PAD protocol per above  DM w/ hyperglycemia Poor glycemic control as of 3/25 Plan Continue resistant sliding scale insulin Adding tube feed coverage Adding basal coverage  Best practice:  Diet: NPO-->tubeffeds started 3/25 Pain/Anxiety/Delirium protocol (if indicated): 3/24 VAP protocol (if indicated): 3/24 DVT prophylaxis: scd (PLTs 65 on admit) GI prophylaxis: PPI Glucose control: SSI Mobility: BR Code Status: full code  Family Communication: pending  Disposition:  She remains critically ill due to pneumonia requiring critical care services to guide PEEP and FiO2 titration.  Are continuing to await covid-19 studies.  She is hemodynamically stable.  For today goal will be to continue supportive care, we will KVO IV fluids, start tube feedings, and making adjustments to better control her hyperglycemia  Critical care time: 32 minutes      Erick Colace ACNP-BC [place] Pager # [phone] OR # [phone] if no answer

## 2019-01-25 NOTE — Progress Notes (Signed)
Wasted 160 ml of fentanyl in sink with Louis Meckel.

## 2019-01-25 NOTE — Progress Notes (Addendum)
Initial Nutrition Assessment  RD working remotely.   DOCUMENTATION CODES:   Obesity unspecified  INTERVENTION:  - Will adjust TF order: Vital High Protein @ 25 ml/hr with 60 ml Prostat TID. This regimen + kcal from current propofol rate will provide 1781 kcal (104% estimated kcal need), 142 grams of protein, and 502 ml free water. - Continue free water flush per MD/NP: 200 ml QID (800 ml/day). - Will order 15 ml liquid multivitamin per OGT/day given TF rate <45 ml/hr.   NUTRITION DIAGNOSIS:   Inadequate oral intake related to inability to eat as evidenced by NPO status.  GOAL:   Provide needs based on ASPEN/SCCM guidelines  MONITOR:   Vent status, TF tolerance, Weight trends, Labs  REASON FOR ASSESSMENT:   Malnutrition Screening Tool, Ventilator, Consult Enteral/tube feeding initiation and management  ASSESSMENT:   33 year old female w/ h/o asthma, NASH and cirrhosis. Admitted to [facility] 3/24 w/ CAP and asthmatic exacerbation (transferred to [facility] d/t COVID-19 rule out).  Estimated nutrition needs based on weight on 3/23 (269 lb/ 122 kg) as this weight is more consistent with weight trend over the past 5 months. Per chart review, weight on 09/15/18 was 286 lb and weight has been slowly trending down since that time. This indicates 17 lb weight loss (6% body weight) in the past 5 months; not significant for time frame. MST screen indicates that patient reported 10 lb weight loss but did not provide time frame for weight loss.  COVID-19 test results pending. Patient on airborne and contact precautions. Patient was intubated in [facility] ED prior to transfer to [facility].   Per Jaime [name]'s note this AM: respiratory viral panel negative, acute hypoxic respiratory failure d/t asthma exacerbation, CAP with bilateral infiltrates concerning for ARDS--xray showing some improvement, severe sepsis 2/2 CAP, slight hypophosphatemia with repletion ordered and plan to recheck 3/26 AM, hx  cirrhosis 2/2 NASH, hx hepatitis C, chronic pain and narcotic dependence, plan to start TF today.   Patient is currently intubated on ventilator support MV: information unavailable at this time Temp (24hrs), Avg:99.3 F (37.4 C), Min:98.4 F (36.9 C), Max:99.9 F (37.7 C) Propofol: 22 ml/hr as of 9:35 AM (581 kcal). BP at 10 AM: 126/61   MAP at 10 AM: 78   Medications reviewed; sliding scale novolog, 11 units novolog every 4 hours, 10 units lantus/day, 20 mmol IV NaPhos x1 dose today. Labs reviewed; Na: 133 mmol/l, Ca: 8 mg/dl, Phos: 2.1 mg/dl. Drips; propofol @ 30 mcg/kg/min, fentanyl @ 75 mcg/hr (as of 9:25 AM).     NUTRITION - FOCUSED PHYSICAL EXAM:  Unable to complete at this time.  Diet Order:   Diet Order            Diet NPO time specified  Diet effective now              EDUCATION NEEDS:   No education needs have been identified at this time  Skin:  Skin Assessment: Reviewed RN Assessment  Last BM:  PTA/unknown  Height:   Ht Readings from Last 1 Encounters:  01/24/19 5' 10"  (1.778 m)    Weight:   Wt Readings from Last 1 Encounters:  01/25/19 127.2 kg    Ideal Body Weight:  68.18 kg  BMI:  Body mass index is 40.24 kg/m.  Estimated Nutritional Needs:   Kcal:  1342-1708 kcal  Protein:  >/= 136 grams  Fluid:  >/= 2 L/day     Jaime Matin, MS, RD, LDN,  [facility] Inpatient Clinical Dietitian Pager # [phone] After hours/weekend pager # [phone]

## 2019-01-26 LAB — COMPREHENSIVE METABOLIC PANEL
ALT: 20 U/L (ref 0–44)
AST: 13 U/L — ABNORMAL LOW (ref 15–41)
Albumin: 2.8 g/dL — ABNORMAL LOW (ref 3.5–5.0)
Alkaline Phosphatase: 54 U/L (ref 38–126)
Anion gap: 8 (ref 5–15)
BUN: 16 mg/dL (ref 6–20)
CHLORIDE: 102 mmol/L (ref 98–111)
CO2: 23 mmol/L (ref 22–32)
Calcium: 8.3 mg/dL — ABNORMAL LOW (ref 8.9–10.3)
Creatinine, Ser: 0.49 mg/dL (ref 0.44–1.00)
GFR calc non Af Amer: >60 mL/min (ref >60)
Glucose, Bld: 333 mg/dL — ABNORMAL HIGH (ref 70–99)
Potassium: 4.5 mmol/L (ref 3.5–5.1)
Sodium: 133 mmol/L — ABNORMAL LOW (ref 135–145)
Total Bilirubin: 0.6 mg/dL (ref 0.3–1.2)
Total Protein: 6 g/dL — ABNORMAL LOW (ref 6.5–8.1)

## 2019-01-26 LAB — GLUCOSE, CAPILLARY
Glucose-Capillary: 159 mg/dL — ABNORMAL HIGH (ref 70–99)
Glucose-Capillary: 202 mg/dL — ABNORMAL HIGH (ref 70–99)
Glucose-Capillary: 217 mg/dL — ABNORMAL HIGH (ref 70–99)
Glucose-Capillary: 252 mg/dL — ABNORMAL HIGH (ref 70–99)
Glucose-Capillary: 327 mg/dL — ABNORMAL HIGH (ref 70–99)
Glucose-Capillary: 333 mg/dL — ABNORMAL HIGH (ref 70–99)
Glucose-Capillary: 73 mg/dL (ref 70–99)

## 2019-01-26 LAB — MAGNESIUM
Magnesium: 2 mg/dL (ref 1.7–2.4)
Magnesium: 1.9 mg/dL (ref 1.7–2.4)

## 2019-01-26 LAB — CBC
HCT: 32.4 % — ABNORMAL LOW (ref 36.0–46.0)
Hemoglobin: 10.4 g/dL — ABNORMAL LOW (ref 12.0–15.0)
MCH: 29.1 pg (ref 26.0–34.0)
MCHC: 32.1 g/dL (ref 30.0–36.0)
MCV: 90.8 fL (ref 80.0–100.0)
Platelets: 56 10*3/uL — ABNORMAL LOW (ref 150–400)
RBC: 3.57 MIL/uL — ABNORMAL LOW (ref 3.87–5.11)
RDW: 13.7 % (ref 11.5–15.5)
WBC: 3 10*3/uL — ABNORMAL LOW (ref 4.0–10.5)
nRBC: 0 % (ref 0.0–0.2)

## 2019-01-26 LAB — PHOSPHORUS
Phosphorus: 2.9 mg/dL (ref 2.5–4.6)
Phosphorus: 3.7 mg/dL (ref 2.5–4.6)

## 2019-01-26 LAB — LEGIONELLA PNEUMOPHILA SEROGP 1 UR AG: L. PNEUMOPHILA SEROGP 1 UR AG: NEGATIVE

## 2019-01-26 LAB — PROCALCITONIN: PROCALCITONIN: 3.93 ng/mL

## 2019-01-26 MED ORDER — ACETAMINOPHEN 325 MG PO TABS: 650.0000 mg | ORAL_TABLET | Freq: Four times a day (QID) | ORAL | Status: DC | PRN

## 2019-01-26 MED ORDER — ALBUTEROL SULFATE HFA 108 (90 BASE) MCG/ACT IN AERS
1.0000 | INHALATION_SPRAY | RESPIRATORY_TRACT | Status: DC
  Administered 2019-01-26 – 2019-01-27 (×5): 1 via RESPIRATORY_TRACT
  Filled 2019-01-26: qty 6.7

## 2019-01-26 MED ORDER — ACETAMINOPHEN 325 MG PO TABS
650.0000 mg | ORAL_TABLET | Freq: Three times a day (TID) | ORAL | Status: DC | PRN
  Administered 2019-01-26 (×2): 650 mg via ORAL
  Filled 2019-01-26 (×2): qty 2

## 2019-01-26 MED ORDER — INSULIN GLARGINE 100 UNIT/ML ~~LOC~~ SOLN
15.0000 [IU] | Freq: Every day | SUBCUTANEOUS | Status: DC
  Administered 2019-01-26: 15 [IU] via SUBCUTANEOUS
  Filled 2019-01-26 (×2): qty 0.15

## 2019-01-26 MED ORDER — METHADONE HCL 10 MG PO TABS
20.0000 mg | ORAL_TABLET | Freq: Every day | ORAL | Status: DC
  Administered 2019-01-26: 20 mg via ORAL
  Filled 2019-01-26: qty 2

## 2019-01-26 MED ORDER — FUROSEMIDE 10 MG/ML IJ SOLN
40.0000 mg | Freq: Once | INTRAMUSCULAR | Status: AC
  Administered 2019-01-26: 40 mg via INTRAVENOUS
  Filled 2019-01-26: qty 4

## 2019-01-26 MED ORDER — ORAL CARE MOUTH RINSE
15.0000 mL | Freq: Two times a day (BID) | OROMUCOSAL | Status: DC
  Administered 2019-01-26: 15 mL via OROMUCOSAL

## 2019-01-26 MED ORDER — IPRATROPIUM BROMIDE HFA 17 MCG/ACT IN AERS
1.0000 | INHALATION_SPRAY | RESPIRATORY_TRACT | Status: DC
  Administered 2019-01-26 – 2019-01-27 (×5): 1 via RESPIRATORY_TRACT
  Filled 2019-01-26: qty 12.9

## 2019-01-26 MED ORDER — INSULIN ASPART 100 UNIT/ML ~~LOC~~ SOLN
15.0000 [IU] | SUBCUTANEOUS | Status: DC
  Administered 2019-01-26 (×4): 15 [IU] via SUBCUTANEOUS

## 2019-01-26 NOTE — Progress Notes (Signed)
33 year old woman with hep C cirrhosis and chronic pain/on methadone, IDDM admitted 3/23 to [facility] with asthma exacerbation and left lower lobe community-acquired pneumonia.  Transferred to [facility] on 3/24 and required intubation.  She had low-grade fevers overnight, remains critically ill, intubated On exam-arousable on low-dose sedation drips, follows commands, moves all 4 extremities, decreased breath sounds bilateral, minimal secretions, S1-S2 regular sinus on monitor, mild distended abdomen with right upper quadrant tenderness, no guarding  Chest x-ray personally reviewed which shows stable bilateral interstitial infiltrates with ET tube at carina.  Labs show mild hyponatremia, improved magnesium and phosphorus, downtrending procalcitonin, mild leukopenia and anemia. CBGs remain high.  Impression/plan Acute respiratory failure-she was weaned, tolerated pressure support 5/5 and was extubated But continues to require high flow oxygen  Community acquired pneumonia-ceftriaxone/azithromycin Respiratory viral panel negative, urine strep antigen negative, awaiting COVID results  Chronic pain on methadone-she takes 50 mg daily, will restart 20 mg Use Tylenol in addition as needed up to 2 g/day, limit due to cirrhosis.  IDDM-continue current dose of Lantus since she will be n.p.o. for a while.  The patient is critically ill with multiple organ systems failure and requires high complexity decision making for assessment and support, frequent evaluation and titration of therapies, application of advanced monitoring technologies and extensive interpretation of multiple databases. Critical Care Time devoted to patient care services described in this note independent of APP/resident  time is 35 minutes.   Leanna Sato Elsworth Soho MD

## 2019-01-26 NOTE — Progress Notes (Signed)
45 cc fentanyl wasted in the sink with Athena Masse RN

## 2019-01-26 NOTE — Progress Notes (Signed)
20 ml versed wasted by Juleen China, witnessed by [employer]

## 2019-01-26 NOTE — Progress Notes (Addendum)
Nutrition Follow-up  RD working remotely.   DOCUMENTATION CODES:   Obesity unspecified  INTERVENTION:  - Will monitor for extubation.   NUTRITION DIAGNOSIS:   Inadequate oral intake related to inability to eat as evidenced by NPO status. -ongoing  GOAL:   Provide needs based on ASPEN/SCCM guidelines -met with TF  MONITOR:   Vent status, TF tolerance, Weight trends, Labs  ASSESSMENT:   33 year old female w/ h/o asthma, NASH and cirrhosis. Admitted to [facility] 3/24 w/ CAP and asthmatic exacerbation (transferred to [facility] d/t COVID-19 rule out).  Weight continues to trend up and is now +8.4 kg/19 lb compared to weight on 3/24. Estimated nutrition needs remain appropriate. Patient remains intubated with OGT in place. She is receiving Vital High Protein @ 25 ml/hr with 60 ml Prostat TID and 200 ml free water QID. This regimen is providing 1200 kcal (89% estimated kcal need), 142 grams of protein, and 1302 ml free water.  Per [name]'s note this AM: acute hypoxic respiratory failure d/t asthma exacerbation, CAP, r/o COVID-19, SBT and assessing for extubation.     Patient is currently intubated on ventilator support MV: last recorded at 4 AM: 10.3 L/min Temp (24hrs), Avg:98.5 F (36.9 C), Min:97.5 F (36.4 C), Max:99.9 F (37.7 C) Propofol: off as of 9:06 AM.  BP at 10 AM: 139/83 and MAP at 10 AM: 101  Medications reviewed; 40 mg IV lasix x1 dose today, sliding scale novolog, 15 units novolog every 4 hours, 15 units lantus/day, 15 ml liquid multivitamin per OGT, 40 mg prednisone per OGT/day, 20 mmol IV NaPhos x1 dose 3/25. Labs reviewed; CBGs: 252-333 mg/dl today, Na: 133 mmol/l, Ca: 8.3 mg/dl. K, Mg, Phos all WDL.     Diet Order:   Diet Order            Diet NPO time specified  Diet effective now              EDUCATION NEEDS:   No education needs have been identified at this time  Skin:  Skin Assessment: Reviewed RN Assessment  Last BM:  PTA/unknown  Height:    Ht Readings from Last 1 Encounters:  01/24/19 5' 10"  (1.778 m)    Weight:   Wt Readings from Last 1 Encounters:  01/26/19 130.5 kg    Ideal Body Weight:  68.18 kg  BMI:  Body mass index is 41.28 kg/m.  Estimated Nutritional Needs:   Kcal:  8676-1950 kcal  Protein:  >/= 136 grams  Fluid:  >/= 2 L/day     Jarome Matin, MS, RD, LDN, [facility] Inpatient Clinical Dietitian Pager # [phone] After hours/weekend pager # [phone]

## 2019-01-26 NOTE — Progress Notes (Addendum)
NAME:  Jaime [name], MRN:  [id number], DOB:  September 08, 1986, LOS: 2 ADMISSION DATE:  01/24/2019, CONSULTATION DATE:  3/24 REFERRING MD:  knapp, CHIEF COMPLAINT:  Acute respiratory failure    Brief History   33 year old female w/ h/o asthma, NASH and cirrhosis. Admitted to [facility] 3/24 w/ CAP and asthmatic exacerbation. (transferred d/t COVID-19 rule out)  Past Medical History  Asthma/COPD (no PFTs), Hep C and NASH (MELD 7 as of 08/24/18), cirrhosis, chronic pain w/ narcotic dependence on methadone, IDDM, prior admits for volume overload  Requiring aggressive diuresis  Significant Hospital Events   3/23 presented to ED at [place] w/ asthmatic exacerbation and LLL CAP. IVFs and ABX started.  3/24 increased WOB. LA increased from 5.9 to 8.7; intubated. COVID-19 sent. Transferred to [facility] as increased to high acuity rule-out.  3/25: Respiratory viral panel negative.  FiO2 and PEEP requirements have improved.  Hemodynamically stable.  X-ray improved some.  Starting tube feeds 3/26 looking better. Had brief desaturation episode during evening but on PSV and SBT am 3/26 Consults:   Procedures:  OETT 3/24 Right IJ CVL 3/24>>> Significant Diagnostic Tests:    Micro Data:  Influenza A&B 3/23: neg BCX2 3/24>>> Respiratory culture 3/24>>> RVP 3/24>>> negative Urine strep 3/24>>> negative Urine legionella antigen 3/24>>>  Antimicrobials:  Ctx 3/23>>> azith 3/23>>>  Interim history/subjective:  Comfortable. On PSV   Objective   Blood pressure (Abnormal) 98/48, pulse (Abnormal) 103, temperature 99.1 F (37.3 C), resp. rate (Abnormal) 35, height 5' 10"  (1.778 m), weight 130.5 kg, SpO2 95 %.    Vent Mode: PRVC FiO2 (%):  [40 %-60 %] 60 % Set Rate:  [18 bmp] 18 bmp Vt Set:  [540 mL] 540 mL PEEP:  [8 cmH20] 8 cmH20 Plateau Pressure:  [23 cmH20-29 cmH20] 24 cmH20   Intake/Output Summary (Last 24 hours) at 01/26/2019 0806 Last data filed at 01/26/2019 0754 Gross per 24 hour  Intake 2942.28 ml   Output 2300 ml  Net 642.28 ml   Filed Weights   01/24/19 0338 01/25/19 0436 01/26/19 0339  Weight: 122.1 kg 127.2 kg 130.5 kg    Examination:  General: this is an obese 33 year old female. Resting in bed. Now on PSV 5 w/out distress HENT NCAT orally intubated. MMM, right IJ is unremarkable Pulm CTA, decreased bases. VTs in 400s on PSV of 5cmH2O Card RRR no MRG abd not tender + bowel sounds  GU clear yellow Neuro intact.  Ext warm trace dependent edema. Brisk CR    Resolved Hospital Problem list   Lactic acidosis   Assessment & Plan:  Acute Hypoxic respiratory failure in setting of asthmatic exacerbation, CAP with bilateral infiltrates worrisome for ARDS -cultures negative to date plan Attempting SBT and assess for extubation vs PSV as tolerated.  PAD protocol; change RASS goal to -1 (avoiding Benzos due to cirrhosis) Scheduled MDIs (try to limit aerosol therapy) VAP bundle Day 4/5 azith Day 4/7 Rocephin Pred taper  F/u covid-19 AM CXR  Severe sepsis 2/2 PNA Plan Keep IVFs at [facility] abx per above    Fluid and electrolyte imbalance Plan Am chemistry  H/o cirrhosis 2/2 NASH and hep C Plan Intermittent LFTs  Chronic thrombocytopenia 2/2 cirrhosis  PLTs actually improving as of 3/26 Plan Trend cbc Transfuse for hgb < 7 SCDs instead of [place] heparin  Continue to hold anticoagulation Trend CBC Will ask pharmacy to evaluate current MAR to ensure current regimen not contributing to thrombocytopenia  H/o chronic pain and narcotic  dependence Plan PAD protocol  DM w/ hyperglycemia Glycemic control still poor. Plan Resistant SSI Lantus and tubefeed basal dosing increased.   Best practice:  Diet: NPO-->tubeffeds started 3/25 Pain/Anxiety/Delirium protocol (if indicated): 3/24 VAP protocol (if indicated): 3/24 DVT prophylaxis: scd (PLTs 65 on admit) GI prophylaxis: PPI Glucose control: SSI Mobility: BR Code Status: full code  Family Communication: pending   Disposition:  Remains critically ill due to need for mechanical ventilation, titration of PEEP/FIO2 and PSV and assessment for extubation.   Critical care time: 40m     PErick ColaceACNP-BC LBe[place]ontPager # [phone] # [phone] no answer

## 2019-01-26 NOTE — Progress Notes (Signed)
[place] Progress Note Patient Name: SHAWNTRICE SALLE DOB: [birth year] MRN: [id number]   Date of Service  01/26/2019  HPI/Events of Note  Sub-optimal glycemic control  eICU Interventions  Coverage insulin increased to 15 units SQ Q 4 hours, Lantus increased to 15 units SQ daily        Beverlyn Mcginness U Quinlynn Cuthbert 01/26/2019, 5:15 AM

## 2019-01-26 NOTE — Procedures (Signed)
Extubation Procedure Note  Patient Details:   Name: Jaime [name] DOB: May 22, 1986 MRN: [id number]   Airway Documentation:    Vent end date: 01/26/19 Vent end time: 1022   Evaluation  O2 sats: stable throughout Complications: No apparent complications Patient did tolerate procedure well. Bilateral Breath Sounds: Diminished   Yes  Tamera Reason 01/26/2019, 10:37 AM

## 2019-01-27 ENCOUNTER — Inpatient Hospital Stay (HOSPITAL_COMMUNITY): Payer: Medicaid Other

## 2019-01-27 LAB — CBC WITH DIFFERENTIAL/PLATELET
Abs Immature Granulocytes: 0.04 10*3/uL (ref 0.00–0.07)
Basophils Absolute: 0 10*3/uL (ref 0.0–0.1)
Basophils Relative: 0 %
Eosinophils Absolute: 0 10*3/uL (ref 0.0–0.5)
Eosinophils Relative: 1 %
HEMATOCRIT: 35.1 % — AB (ref 36.0–46.0)
HEMOGLOBIN: 10.8 g/dL — AB (ref 12.0–15.0)
Immature Granulocytes: 1 %
LYMPHS PCT: 26 %
Lymphs Abs: 0.8 10*3/uL (ref 0.7–4.0)
MCH: 28.5 pg (ref 26.0–34.0)
MCHC: 30.8 g/dL (ref 30.0–36.0)
MCV: 92.6 fL (ref 80.0–100.0)
MONO ABS: 0.3 10*3/uL (ref 0.1–1.0)
Monocytes Relative: 11 %
Neutro Abs: 1.8 10*3/uL (ref 1.7–7.7)
Neutrophils Relative %: 61 %
Platelets: 65 10*3/uL — ABNORMAL LOW (ref 150–400)
RBC: 3.79 MIL/uL — ABNORMAL LOW (ref 3.87–5.11)
RDW: 13.7 % (ref 11.5–15.5)
WBC: 3 10*3/uL — ABNORMAL LOW (ref 4.0–10.5)
nRBC: 0 % (ref 0.0–0.2)

## 2019-01-27 LAB — COMPREHENSIVE METABOLIC PANEL
ALK PHOS: 55 U/L (ref 38–126)
ALT: 20 U/L (ref 0–44)
AST: 25 U/L (ref 15–41)
Albumin: 2.9 g/dL — ABNORMAL LOW (ref 3.5–5.0)
Anion gap: 9 (ref 5–15)
BILIRUBIN TOTAL: 0.8 mg/dL (ref 0.3–1.2)
BUN: 19 mg/dL (ref 6–20)
CO2: 27 mmol/L (ref 22–32)
Calcium: 8.2 mg/dL — ABNORMAL LOW (ref 8.9–10.3)
Chloride: 104 mmol/L (ref 98–111)
Creatinine, Ser: 0.57 mg/dL (ref 0.44–1.00)
GFR calc Af Amer: >60 mL/min (ref >60)
Glucose, Bld: 110 mg/dL — ABNORMAL HIGH (ref 70–99)
Potassium: 3.4 mmol/L — ABNORMAL LOW (ref 3.5–5.1)
Sodium: 140 mmol/L (ref 135–145)
Total Protein: 5.9 g/dL — ABNORMAL LOW (ref 6.5–8.1)

## 2019-01-27 LAB — MAGNESIUM: MAGNESIUM: 1.9 mg/dL (ref 1.7–2.4)

## 2019-01-27 LAB — GLUCOSE, CAPILLARY
Glucose-Capillary: 73 mg/dL (ref 70–99)
Glucose-Capillary: 79 mg/dL (ref 70–99)
Glucose-Capillary: 151 mg/dL — ABNORMAL HIGH (ref 70–99)
Glucose-Capillary: 174 mg/dL — ABNORMAL HIGH (ref 70–99)
Glucose-Capillary: 167 mg/dL — ABNORMAL HIGH (ref 70–99)

## 2019-01-27 MED ORDER — FUROSEMIDE 10 MG/ML IJ SOLN
40.0000 mg | Freq: Once | INTRAMUSCULAR | Status: AC
  Administered 2019-01-27: 40 mg via INTRAVENOUS
  Filled 2019-01-27: qty 4

## 2019-01-27 MED ORDER — PREDNISONE 20 MG PO TABS: 40.0000 mg | ORAL_TABLET | Freq: Every day | ORAL | Status: DC

## 2019-01-27 MED ORDER — INSULIN ASPART 100 UNIT/ML ~~LOC~~ SOLN: 0.0000 [IU] | Freq: Every day | SUBCUTANEOUS | Status: DC

## 2019-01-27 MED ORDER — ALBUTEROL SULFATE HFA 108 (90 BASE) MCG/ACT IN AERS
1.0000 | INHALATION_SPRAY | RESPIRATORY_TRACT | Status: DC | PRN
  Administered 2019-01-28 – 2019-01-30 (×3): 1 via RESPIRATORY_TRACT
  Filled 2019-01-27 (×2): qty 6.7

## 2019-01-27 MED ORDER — CLONAZEPAM 1 MG PO TABS
1.0000 mg | ORAL_TABLET | Freq: Every day | ORAL | Status: DC
  Administered 2019-01-27: 1 mg via ORAL
  Filled 2019-01-27: qty 1

## 2019-01-27 MED ORDER — POTASSIUM CHLORIDE CRYS ER 20 MEQ PO TBCR
40.0000 meq | EXTENDED_RELEASE_TABLET | Freq: Once | ORAL | Status: AC
  Administered 2019-01-27: 40 meq via ORAL
  Filled 2019-01-27: qty 2

## 2019-01-27 MED ORDER — CHLORHEXIDINE GLUCONATE CLOTH 2 % EX PADS
6.0000 | MEDICATED_PAD | Freq: Every day | CUTANEOUS | Status: DC
  Administered 2019-01-27 – 2019-01-28 (×2): 6 via TOPICAL

## 2019-01-27 MED ORDER — INSULIN ASPART 100 UNIT/ML ~~LOC~~ SOLN
0.0000 [IU] | Freq: Three times a day (TID) | SUBCUTANEOUS | Status: DC
  Administered 2019-01-27 (×2): 4 [IU] via SUBCUTANEOUS
  Administered 2019-01-28: 7 [IU] via SUBCUTANEOUS
  Administered 2019-01-28 – 2019-01-29 (×3): 4 [IU] via SUBCUTANEOUS
  Administered 2019-01-29 – 2019-01-30 (×3): 3 [IU] via SUBCUTANEOUS

## 2019-01-27 MED ORDER — METHADONE HCL 10 MG PO TABS
50.0000 mg | ORAL_TABLET | Freq: Every day | ORAL | Status: DC
  Administered 2019-01-27 – 2019-01-30 (×4): 50 mg via ORAL
  Filled 2019-01-27 (×4): qty 5

## 2019-01-27 MED ORDER — FLUTICASONE FUROATE-VILANTEROL 100-25 MCG/INH IN AEPB
1.0000 | INHALATION_SPRAY | Freq: Every day | RESPIRATORY_TRACT | Status: DC
  Administered 2019-01-27 – 2019-01-30 (×3): 1 via RESPIRATORY_TRACT
  Filled 2019-01-27 (×2): qty 28

## 2019-01-27 MED ORDER — POTASSIUM CHLORIDE 20 MEQ/15ML (10%) PO SOLN
40.0000 meq | ORAL | Status: DC
  Administered 2019-01-27: 40 meq via ORAL
  Filled 2019-01-27: qty 30

## 2019-01-27 MED ORDER — ORAL CARE MOUTH RINSE: 15.0000 mL | Freq: Two times a day (BID) | OROMUCOSAL | Status: DC

## 2019-01-27 MED ORDER — OXYCODONE HCL 5 MG/5ML PO SOLN
5.0000 mg | Freq: Four times a day (QID) | ORAL | Status: DC | PRN
  Administered 2019-01-27: 5 mg via ORAL
  Filled 2019-01-27: qty 5

## 2019-01-27 MED ORDER — INSULIN GLARGINE 100 UNIT/ML ~~LOC~~ SOLN
5.0000 [IU] | Freq: Every day | SUBCUTANEOUS | Status: DC
  Administered 2019-01-27 – 2019-01-29 (×3): 5 [IU] via SUBCUTANEOUS
  Filled 2019-01-27 (×4): qty 0.05

## 2019-01-27 MED ORDER — ADULT MULTIVITAMIN W/MINERALS CH
1.0000 | ORAL_TABLET | Freq: Every day | ORAL | Status: DC
  Administered 2019-01-27 – 2019-01-30 (×4): 1 via ORAL
  Filled 2019-01-27 (×4): qty 1

## 2019-01-27 MED ORDER — PREDNISONE 20 MG PO TABS
30.0000 mg | ORAL_TABLET | Freq: Every day | ORAL | Status: DC
  Administered 2019-01-28: 30 mg via ORAL
  Filled 2019-01-27: qty 1

## 2019-01-27 MED ORDER — CLONAZEPAM 1 MG PO TABS
1.0000 mg | ORAL_TABLET | Freq: Four times a day (QID) | ORAL | Status: DC
  Administered 2019-01-27 – 2019-01-30 (×13): 1 mg via ORAL
  Filled 2019-01-27 (×8): qty 1
  Filled 2019-01-27: qty 2
  Filled 2019-01-27 (×4): qty 1

## 2019-01-27 MED ORDER — PANTOPRAZOLE SODIUM 40 MG PO TBEC
40.0000 mg | DELAYED_RELEASE_TABLET | Freq: Every day | ORAL | Status: DC
  Administered 2019-01-27 – 2019-01-30 (×4): 40 mg via ORAL
  Filled 2019-01-27 (×4): qty 1

## 2019-01-27 NOTE — Progress Notes (Signed)
Results from state health dept are negative for covid. Pt can have isolation for contact and droplet stopped. Verbal Orders given to charge Rn who will print out results to place in chart.

## 2019-01-27 NOTE — Progress Notes (Signed)
[place] Progress Note Patient Name: Jaime [name] DOB: [birth year] MRN: [id number]   Date of Service  01/27/2019  HPI/Events of Note  Pt needs AM labs ordered  eICU Interventions  Order for AM labs entered        Frederik Pear 01/27/2019, 6:13 AM

## 2019-01-27 NOTE — Progress Notes (Signed)
ID PROGRESS note  I spoke state lab - resulting out tonite.Caren Griffins B. [place] [facility] [phone]

## 2019-01-27 NOTE — Progress Notes (Signed)
Nutrition Follow-up  RD working remotely.   DOCUMENTATION CODES:   Obesity unspecified  INTERVENTION:  - Will order daily multivitamin with minerals. - Continue to encourage PO intakes. - Will monitor for additional nutrition-related needs.   NUTRITION DIAGNOSIS:   Inadequate oral intake related to inability to eat as evidenced by NPO status. -diet recently advanced  GOAL:   Patient will meet greater than or equal to 90% of their needs -unmet at this time.  MONITOR:   PO intake, Weight trends, Labs  ASSESSMENT:   33 year old female w/ h/o asthma, NASH and cirrhosis. Admitted to [facility] 3/24 w/ CAP and asthmatic exacerbation (transferred to [facility] d/t COVID-19 rule out).  Patient was extubated and OGT removed yesterday around 10:40 AM. Estimated nutrition needs updated at this time and based on admission weight (122.1 kg on 3/24) as weight +5.6 kg since that time. Diet advanced from NPO to CLD yesterday at 1:00 PM and to Carb Modified today at 8:45 AM.   Spoke with RN who reports that breakfast tray was recently delivered to the floor and will be provided to patient shortly. RN states that patient had mentioned that she is feeling hungry and is looking forward to eating. RN unaware of any abdominal pain or nausea this AM.   Medications reviewed; 40 mg IV lasix x2 doses today, sliding scale novolog, 5 units lantus/day, 40 mEq oral KCl x2 doses today, 30 mg deltasone/day.  Labs reviewed; CBGs: 73 and 79 mg/dl today, K: 3.4 mmol/l, Ca: 8.2 mg/dl.         Diet Order:   Diet Order            Diet Carb Modified Fluid consistency: Thin; Room service appropriate? Yes  Diet effective now              EDUCATION NEEDS:   No education needs have been identified at this time  Skin:  Skin Assessment: Reviewed RN Assessment  Last BM:  PTA/unknown  Height:   Ht Readings from Last 1 Encounters:  01/24/19 5' 10"  (1.778 m)    Weight:   Wt Readings from Last 1  Encounters:  01/27/19 127.7 kg    Ideal Body Weight:  68.18 kg  BMI:  Body mass index is 40.39 kg/m.  Estimated Nutritional Needs:   Kcal:  2200-2400 kcal  Protein:  110-120 grams  Fluid:  >/= 2 L/day     Jarome Matin, MS, RD, LDN, [facility] Inpatient Clinical Dietitian Pager # [phone] After hours/weekend pager # [phone]

## 2019-01-27 NOTE — Progress Notes (Signed)
Received phone call from Dr. Baxter Flattery regarding receipt of negative Covid results on [employer].  Negative results from state lab printed and placed on chart.  Per Dr. Baxter Flattery contact and droplet precautions have been discontinued.  Jacqulyn Ducking ICU/SD Care Coordinator / Rapid Response Nurse Rapid Response Number:  [phone]

## 2019-01-27 NOTE — Progress Notes (Signed)
NAME:  Jaime [name], MRN:  [id number], DOB:  1986/07/24, LOS: 3 ADMISSION DATE:  01/24/2019, CONSULTATION DATE:  3/24 REFERRING MD:  knapp, CHIEF COMPLAINT:  Acute respiratory failure    Brief History   33 year old female w/ h/o asthma, NASH and cirrhosis. Admitted to [facility] 3/24 w/ CAP and asthmatic exacerbation. (transferred d/t COVID-19 rule out)  Past Medical History  Asthma/COPD (no PFTs), Hep C and NASH (MELD 7 as of 08/24/18), cirrhosis, chronic pain w/ narcotic dependence on methadone, IDDM, prior admits for volume overload  Requiring aggressive diuresis  Significant Hospital Events   3/23 presented to ED at [place] w/ asthmatic exacerbation and LLL CAP. IVFs and ABX started.  3/24 increased WOB. LA increased from 5.9 to 8.7; intubated. COVID-19 sent. Transferred to [facility] as increased to high acuity rule-out.  3/25: Respiratory viral panel negative.  FiO2 and PEEP requirements have improved.  Hemodynamically stable.  X-ray improved some.  Starting tube feeds 3/26 looking better. Had brief desaturation episode during evening but on PSV and SBT am 3/26.  Extubated 3/27: Still on high flow oxygen, chest x-ray with ongoing diffuse patchy pulmonary infiltrates.  Treating with escalating diuretics.  Completing a azithromycin, mobilizing, initiating diet, resuming home methadone and Klonopin regimen, changing sliding scale Consults:   Procedures:  OETT 3/24: Extubated 3/26 right IJ CVL 3/24>>> Significant Diagnostic Tests:    Micro Data:  Influenza A&B 3/23: neg BCX2 3/24>>> Respiratory culture 3/24>>> RVP 3/24>>> negative Urine strep 3/24>>> negative Urine legionella antigen 3/24>>> negative  Antimicrobials:  Ctx 3/23>>> azith 3/23>>>  Interim history/subjective:  Feels better.  Wants to eat.  Feels "swollen"  Objective   Blood pressure (Abnormal) 142/80, pulse 81, temperature 99.9 F (37.7 C), temperature source Bladder, resp. rate (Abnormal) 21, height 5' 10"  (1.778 m),  weight 127.7 kg, SpO2 95 %.    FiO2 (%):  [40 %] 40 %   Intake/Output Summary (Last 24 hours) at 01/27/2019 0855 Last data filed at 01/27/2019 0800 Gross per 24 hour  Intake 1186.1 ml  Output 5150 ml  Net -3963.9 ml   Filed Weights   01/25/19 0436 01/26/19 0339 01/27/19 0500  Weight: 127.2 kg 130.5 kg 127.7 kg    Examination:  General: Very pleasant 33 year old female she is resting in bed and in no acute distress this morning HEENT normocephalic atraumatic no jugular venous distention Pulmonary: Diminished bases no accessory use this a.m. Cardiac: Regular rate, regular rhythm Abdomen: Soft nontender no organomegaly Extremities: Warm and dry brisk capillary refill strong pulses Neuro: Awake oriented GU: Voiding  Resolved Hospital Problem list   Lactic acidosis Severe sepsis  Assessment & Plan:  Acute Hypoxic respiratory failure in setting of asthmatic exacerbation, CAP with bilateral infiltrates worrisome for ARDS -cultures negative to date; successfully extubated 3/26  -Still on 11 L high flow oxygen  Portable chest x-ray: Personally reviewed this continues to demonstrate bilateral patchy pulmonary infiltrates without significant improvement Plan Continuing supplemental oxygen and weaning for saturations greater than 90% Escalate diuretics PCXR am Day 5 azith (complete today) Day 5/8 ceftriaxone  Await COVID-19 Cont current isolation   Fluid and electrolyte imbalance: Hypokalemia Plan Replace and recheck  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 Plan Change sliding scale to [facility] and  at bedtime  Best practice:  Diet: NPO-->tubeffeds started 3/25 Pain/Anxiety/Delirium protocol (if indicated): 3/24  VAP protocol (if indicated): 3/24 DVT prophylaxis: scd (PLTs 65 on admit) GI prophylaxis: PPI Glucose control: SSI Mobility: BR Code Status: full code  Family Communication: pending  Disposition:  She remains critically ill due to her ongoing oxygen requirements however she does appear to be improving.  Critical care standpoint we will continue to adjust her high flow oxygen titrating this for saturations greater than 90% escalating her diabetic regimen.  Allowing oral intake today, adjusting glycemic control regimen, resuming home medications.   Critical care time: 31 minutes      Erick Colace ACNP-BC [place] Pager # [phone] OR # [phone] if no answer

## 2019-01-27 NOTE — Progress Notes (Signed)
Unopened vial of 100 mcg fentanyl found in patient's room. No unwitnessed "waste" in pyxis associated with patient. Wasted in Chief Operating Officer, verified by Cyndie Chime, RN.

## 2019-01-27 NOTE — Progress Notes (Addendum)
[place] Progress Note Patient Name: Jaime [name] DOB: 1986-04-30 MRN: [id number]   Date of Service  01/27/2019  HPI/Events of Note  Occasional headache, she is limited in terms of how much Tylenol she can have due to Hep C liver disease  eICU Interventions  Will add oxycodone 5 mg Q 8 prn to be staggered with her Tylenol dose.        Kerry Kass [place] 01/27/2019, 2:52 AM

## 2019-01-28 ENCOUNTER — Inpatient Hospital Stay (HOSPITAL_COMMUNITY): Payer: Medicaid Other

## 2019-01-28 LAB — BASIC METABOLIC PANEL
ANION GAP: 8 (ref 5–15)
BUN: 13 mg/dL (ref 6–20)
CALCIUM: 8.1 mg/dL — AB (ref 8.9–10.3)
CO2: 29 mmol/L (ref 22–32)
Chloride: 102 mmol/L (ref 98–111)
Creatinine, Ser: 0.53 mg/dL (ref 0.44–1.00)
GFR calc Af Amer: >60 mL/min (ref >60)
GFR calc non Af Amer: >60 mL/min (ref >60)
Glucose, Bld: 108 mg/dL — ABNORMAL HIGH (ref 70–99)
Potassium: 3.4 mmol/L — ABNORMAL LOW (ref 3.5–5.1)
Sodium: 139 mmol/L (ref 135–145)

## 2019-01-28 LAB — GLUCOSE, CAPILLARY
Glucose-Capillary: 170 mg/dL — ABNORMAL HIGH (ref 70–99)
Glucose-Capillary: 245 mg/dL — ABNORMAL HIGH (ref 70–99)
Glucose-Capillary: 168 mg/dL — ABNORMAL HIGH (ref 70–99)
Glucose-Capillary: 141 mg/dL — ABNORMAL HIGH (ref 70–99)

## 2019-01-28 LAB — CULTURE, RESPIRATORY W GRAM STAIN

## 2019-01-28 LAB — CBC
HEMATOCRIT: 31.8 % — AB (ref 36.0–46.0)
Hemoglobin: 9.8 g/dL — ABNORMAL LOW (ref 12.0–15.0)
MCH: 28.4 pg (ref 26.0–34.0)
MCHC: 30.8 g/dL (ref 30.0–36.0)
MCV: 92.2 fL (ref 80.0–100.0)
Platelets: 63 10*3/uL — ABNORMAL LOW (ref 150–400)
RBC: 3.45 MIL/uL — ABNORMAL LOW (ref 3.87–5.11)
RDW: 13.3 % (ref 11.5–15.5)
WBC: 2.5 10*3/uL — ABNORMAL LOW (ref 4.0–10.5)
nRBC: 0 % (ref 0.0–0.2)

## 2019-01-28 LAB — CULTURE, RESPIRATORY

## 2019-01-28 MED ORDER — LACTULOSE 10 GM/15ML PO SOLN: 20.0000 g | Freq: Two times a day (BID) | ORAL | Status: DC | PRN

## 2019-01-28 MED ORDER — TORSEMIDE 20 MG PO TABS
20.0000 mg | ORAL_TABLET | Freq: Every day | ORAL | Status: DC
  Administered 2019-01-28 – 2019-01-29 (×2): 20 mg via ORAL
  Filled 2019-01-28 (×2): qty 1

## 2019-01-28 MED ORDER — SPIRONOLACTONE 25 MG PO TABS
50.0000 mg | ORAL_TABLET | Freq: Every day | ORAL | Status: DC
  Administered 2019-01-28 – 2019-01-29 (×2): 50 mg via ORAL
  Filled 2019-01-28 (×2): qty 2

## 2019-01-28 NOTE — Progress Notes (Signed)
Called by Dr. Lake Bells with request for transfer to our service.  Patient is a 33yo morbidly obese (BMI 40) patient with h/o asthma and NASH-associated cirrhosis admitted on 3/24 from [facility].  Initial concern for COVID but more likely aspiration associated with aspiration, polysubstance abuse.  Extubated yesterday.  Still with left lung infiltrate, on Ceftriaxone.  COVID negative.  Transferring to Med Surg today.  [facility] will accept the patient in transfer tomorrow AM.   Carlyon Shadow, M.D.

## 2019-01-28 NOTE — Progress Notes (Addendum)
NAME:  Jaime [name], MRN:  [id number], DOB:  04/30/1986, LOS: 4 ADMISSION DATE:  01/24/2019, CONSULTATION DATE:  01/28/2019 REFERRING MD:  Tomi Bamberger, CHIEF COMPLAINT:  Acute respiratory failure with hypoxemia   Brief History   33 y/o female with asthma, NASH and cirrhosis, admitted 3/24 wit CAP and asthma exacerbation.  Intubated due to worsening infiltrates, ARDS.  Extubated on 3/27.   Significant Hospital Events   3/23 presented to ED at [place] w/ asthmatic exacerbation and LLL CAP. IVFs and ABX started.  3/24 increased WOB. LA increased from 5.9 to 8.7; intubated. COVID-19 sent. Transferred to [facility] as increased to high acuity rule-out.  3/25: Respiratory viral panel negative.  FiO2 and PEEP requirements have improved.  Hemodynamically stable.  X-ray improved some.  Starting tube feeds 3/26 looking better. Had brief desaturation episode during evening but on PSV and SBT am 3/26.  Extubated 3/27: Still on high flow oxygen, chest x-ray with ongoing diffuse patchy pulmonary infiltrates.  Treating with escalating diuretics.  Completing a azithromycin, mobilizing, initiating diet, resuming home methadone and Klonopin regimen, changing sliding scale Consults:   Procedures:  OETT 3/24: Extubated 3/26 right IJ CVL 3/24>>> Significant Diagnostic Tests:    Micro Data:  Influenza A&B 3/23: neg BCX2 3/24>>> Respiratory culture 3/24>>> RVP 3/24>>> negative Urine strep 3/24>>> negative Urine legionella antigen 3/24>>> negative Novel Coronavirus 3/24 >> negative  Antimicrobials:  Ctx 3/23>>> azith 3/23>>>   Interim history/subjective:  Feels better eating Breathing is OK  Objective   Blood pressure (!) 151/91, pulse 90, temperature 99.1 F (37.3 C), resp. rate (!) 25, height 5' 10"  (1.778 m), weight 126.5 kg, SpO2 91 %.        Intake/Output Summary (Last 24 hours) at 01/28/2019 0905 Last data filed at 01/28/2019 0500 Gross per 24 hour  Intake 220.02 ml  Output 5000 ml  Net  -4779.98 ml   Filed Weights   01/26/19 0339 01/27/19 0500 01/28/19 0500  Weight: 130.5 kg 127.7 kg 126.5 kg    Examination:  General:  Resting comfortably in bed HENT: NCAT OP clear PULM: CTA B, normal effort CV: RRR, no mgr GI: BS+, soft, nontender MSK: normal bulk and tone Neuro: awake, alert, no distress, MAEW   Resolved Hospital Problem list   ARDS Severe sepsis  Assessment & Plan:  Acute respiratory failure with hypoxemia -extubated on 3/26 Plan: Continue supplemental O2 and wean for O2 sat > 90% Continue diuresis again today CXR prn Continue ceftriaxone 2 more days Off isolation  Hypokalemia Replete  NASH cirrhosis Monitor for volume overload Restart home spironolactone and torsemide  Pancytopenia > due to cirrhosis? Stable Monitor for bleeding Transfuse PRBC for Hgb < 7 gm/dL CBC prn  Nutrition Regular diet  Deconditioning Out of bed today  DM2 with hyperglycemia SSI    Best practice:  Diet: regular Pain/Anxiety/Delirium protocol (if indicated): na/ VAP protocol (if indicated): n/a DVT prophylaxis: SCD GI prophylaxis: PPI> stop today Glucose control: SSI Mobility: out of bed today Code Status: full Family Communication: none bedside Disposition: move to med surg, [facility]  Labs   CBC: Recent Labs  Lab 01/24/19 0505 01/25/19 0424 01/26/19 0333 01/27/19 0621 01/28/19 0531  WBC 3.2* 3.1* 3.0* 3.0* 2.5*  NEUTROABS 2.4  --   --  1.8  --   HGB 14.0 11.1* 10.4* 10.8* 9.8*  HCT 43.5 34.4* 32.4* 35.1* 31.8*  MCV 89.3 90.1 90.8 92.6 92.2  PLT 65* 38* 56* 65* 63*    Basic Metabolic Panel: Recent Labs  Lab 01/24/19 0505  01/25/19 0600 01/25/19 1149 01/25/19 1611 01/26/19 0333 01/26/19 1651 01/27/19 0621 01/28/19 0531  NA 135  --  133*  --   --  133*  --  140 139  K 3.6  --  4.7  --   --  4.5  --  3.4* 3.4*  CL 100  --  105  --   --  102  --  104 102  CO2 22  --  20*  --   --  23  --  27 29  GLUCOSE 242*  --  297*  --   --  333*   --  110* 108*  BUN 8  --  12  --   --  16  --  19 13  CREATININE 0.62  --  0.54  --   --  0.49  --  0.57 0.53  CALCIUM 8.6*  --  8.0*  --   --  8.3*  --  8.2* 8.1*  MG  --    < > 1.9 2.0 2.1 2.0 1.9 1.9  --   PHOS  --   --  2.1* 2.2* 2.7 2.9 3.7  --   --    < > = values in this interval not displayed.   GFR: Estimated Creatinine Clearance: 143.4 mL/min (by C-G formula based on SCr of 0.53 mg/dL). Recent Labs  Lab 01/24/19 0915 01/24/19 1313 01/24/19 1530 01/24/19 1850 01/25/19 0424 01/25/19 0600 01/25/19 1811 01/26/19 0333 01/27/19 0621 01/28/19 0531  PROCALCITON  --   --  5.55  --   --  4.81  --  3.93  --   --   WBC  --   --   --   --  3.1*  --   --  3.0* 3.0* 2.5*  LATICACIDVEN 8.7* 3.6*  --  2.6*  --   --  2.1*  --   --   --     Liver Function Tests: Recent Labs  Lab 01/24/19 0505 01/26/19 0333 01/27/19 0621  AST 33 13* 25  ALT 27 20 20   ALKPHOS 92 54 55  BILITOT 1.9* 0.6 0.8  PROT 7.2 6.0* 5.9*  ALBUMIN 3.6 2.8* 2.9*   No results for input(s): LIPASE, AMYLASE in the last 168 hours. No results for input(s): AMMONIA in the last 168 hours.  ABG    Component Value Date/Time   PHART 7.339 (L) 01/25/2019 0408   PCO2ART 43.1 01/25/2019 0408   PO2ART 95.2 01/25/2019 0408   HCO3 22.5 01/25/2019 0408   TCO2 21.8 08/21/2010 1440   ACIDBASEDEF 2.6 (H) 01/25/2019 0408   O2SAT 97.4 01/25/2019 0408     Coagulation Profile: Recent Labs  Lab 01/24/19 1530  INR 1.4*    Cardiac Enzymes: No results for input(s): CKTOTAL, CKMB, CKMBINDEX, TROPONINI in the last 168 hours.  HbA1C: No results found for: HGBA1C  CBG: Recent Labs  Lab 01/27/19 0948 01/27/19 1348 01/27/19 1842 01/27/19 2304 01/28/19 0747  GLUCAP 79 151* 174* 167* 170*     Roselie Awkward, MD [facility] Pager: [phone] Cell: [phone] If no response, call [phone]

## 2019-01-29 DIAGNOSIS — L8990 Pressure ulcer of unspecified site, unspecified stage: Secondary | ICD-10-CM

## 2019-01-29 LAB — CULTURE, BLOOD (ROUTINE X 2)
Culture: NO GROWTH
Culture: NO GROWTH
Special Requests: ADEQUATE
Special Requests: ADEQUATE

## 2019-01-29 LAB — BASIC METABOLIC PANEL
Anion gap: 8 (ref 5–15)
BUN: 12 mg/dL (ref 6–20)
CO2: 28 mmol/L (ref 22–32)
CREATININE: 0.52 mg/dL (ref 0.44–1.00)
Calcium: 8.3 mg/dL — ABNORMAL LOW (ref 8.9–10.3)
Chloride: 102 mmol/L (ref 98–111)
GFR calc Af Amer: >60 mL/min (ref >60)
GFR calc non Af Amer: >60 mL/min (ref >60)
Glucose, Bld: 116 mg/dL — ABNORMAL HIGH (ref 70–99)
Potassium: 3.4 mmol/L — ABNORMAL LOW (ref 3.5–5.1)
Sodium: 138 mmol/L (ref 135–145)

## 2019-01-29 LAB — GLUCOSE, CAPILLARY
GLUCOSE-CAPILLARY: 109 mg/dL — AB (ref 70–99)
Glucose-Capillary: 149 mg/dL — ABNORMAL HIGH (ref 70–99)
Glucose-Capillary: 167 mg/dL — ABNORMAL HIGH (ref 70–99)
Glucose-Capillary: 119 mg/dL — ABNORMAL HIGH (ref 70–99)

## 2019-01-29 MED ORDER — TORSEMIDE 20 MG PO TABS
40.0000 mg | ORAL_TABLET | Freq: Every day | ORAL | Status: DC
  Filled 2019-01-29: qty 2

## 2019-01-29 MED ORDER — SODIUM CHLORIDE 0.9% FLUSH: 10.0000 mL | INTRAVENOUS | Status: DC | PRN

## 2019-01-29 MED ORDER — SPIRONOLACTONE 100 MG PO TABS
100.0000 mg | ORAL_TABLET | Freq: Every day | ORAL | Status: DC
  Filled 2019-01-29: qty 1

## 2019-01-29 NOTE — Evaluation (Signed)
Physical Therapy Evaluation Patient Details Name: Jaime [name] MRN: [id number] DOB: January 19, 1986 Today's Date: 01/29/2019   History of Present Illness  33 y/o female with asthma, NASH and cirrhosis, admitted 3/24 wit CAP and asthma exacerbation.  Intubated due to worsening infiltrates, ARDS and extubated on 3/27.  Clinical Impression  Pt admitted with above diagnosis. Pt currently with functional limitations due to the deficits listed below (see PT Problem List).  Pt will benefit from skilled PT to increase their independence and safety with mobility to allow discharge to the venue listed below.  Pt ambulated in hallway and fatigues quickly.  SpO2 89-91% during ambulation on room air.  Pt educated to take frequent rest breaks upon d/c and perform activities in moderation.  Pt anticipates d/c home possibly today.  Pt agreeable to RW upon d/c if able to acquire.     Follow Up Recommendations No PT follow up    Equipment Recommendations  Rolling walker with 5" wheels    Recommendations for Other Services       Precautions / Restrictions Precautions Precaution Comments: monitor sats      Mobility  Bed Mobility               General bed mobility comments: pt up on BSC alone in room on arrival  Transfers Overall transfer level: Needs assistance Equipment used: None Transfers: Sit to/from Stand Sit to Stand: Supervision         General transfer comment: supervision for safety, pt able to perform pericare however required seated rest break  Ambulation/Gait Ambulation/Gait assistance: Min guard Gait Distance (Feet): 120 Feet Assistive device: Rolling walker (2 wheeled) Gait Pattern/deviations: Step-through pattern;Decreased stride length     General Gait Details: slow but steady gait with RW, 2 standing rest breaks due to LE weakness per pt, SPO2 89-91% on room air, pt feels more comfortable using RW for support and endurance  Stairs            Wheelchair  Mobility    Modified Rankin (Stroke Patients Only)       Balance                                             Pertinent Vitals/Pain Pain Assessment: No/denies pain    Home Living Family/patient expects to be discharged to:: Private residence Living Arrangements: Parent           Home Layout: One level Home Equipment: None      Prior Function Level of Independence: Independent               Hand Dominance        Extremity/Trunk Assessment        Lower Extremity Assessment Lower Extremity Assessment: Generalized weakness       Communication   Communication: No difficulties  Cognition Arousal/Alertness: Awake/alert Behavior During Therapy: WFL for tasks assessed/performed Overall Cognitive Status: Within Functional Limits for tasks assessed                                        General Comments      Exercises     Assessment/Plan    PT Assessment Patient needs continued PT services  PT Problem List Decreased strength;Decreased mobility;Decreased activity tolerance;Decreased knowledge of use of DME;Cardiopulmonary  status limiting activity       PT Treatment Interventions DME instruction;Functional mobility training;Gait training;Therapeutic activities;Balance training;Stair training;Therapeutic exercise;Patient/family education    PT Goals (Current goals can be found in the Care Plan section)  Acute Rehab PT Goals PT Goal Formulation: With patient Time For Goal Achievement: 02/05/19 Potential to Achieve Goals: Good    Frequency Min 3X/week   Barriers to discharge        Co-evaluation               AM-PAC PT "6 Clicks" Mobility  Outcome Measure Help needed turning from your back to your side while in a flat bed without using bedrails?: None Help needed moving from lying on your back to sitting on the side of a flat bed without using bedrails?: A Little Help needed moving to and from a bed to a  chair (including a wheelchair)?: A Little Help needed standing up from a chair using your arms (e.g., wheelchair or bedside chair)?: A Little Help needed to walk in hospital room?: A Little Help needed climbing 3-5 steps with a railing? : A Little 6 Click Score: 19    End of Session   Activity Tolerance: Patient tolerated treatment well Patient left: in chair;with call bell/phone within reach Nurse Communication: Mobility status PT Visit Diagnosis: Difficulty in walking, not elsewhere classified (R26.2)    Time: 9826-4158 PT Time Calculation (min) (ACUTE ONLY): 22 min   Charges:   PT Evaluation $PT Eval Low Complexity: [place], PT, DPT Acute Rehabilitation Services Office: [phone] Pager: [phone]    Trena Platt 01/29/2019, 10:56 AM

## 2019-01-29 NOTE — Plan of Care (Signed)
  Problem: Elimination: Goal: Will not experience complications related to bowel motility 01/29/2019 1121 by Dorene Sorrow, RN Outcome: Progressing 01/29/2019 1109 by Dorene Sorrow, RN Outcome: Progressing   Problem: Pain Managment: Goal: General experience of comfort will improve 01/29/2019 1121 by Dorene Sorrow, RN Outcome: Progressing 01/29/2019 1109 by Dorene Sorrow, RN Outcome: Progressing   Problem: Safety: Goal: Ability to remain free from injury will improve 01/29/2019 1121 by Dorene Sorrow, RN Outcome: Progressing 01/29/2019 1109 by Dorene Sorrow, RN Outcome: Progressing

## 2019-01-29 NOTE — Progress Notes (Signed)
PROGRESS NOTE  TOWANDA HORNSTEIN FUW:[id number] DOB: [birth year] DOA: 01/24/2019 PCP: Sinda Du, MD   LOS: 5 days   Brief narrative: 33 y/o female with asthma, NASH and cirrhosis, admitted 3/24 wit CAP and asthma exacerbation.  Intubated due to worsening infiltrates, ARDS.  Extubated on 3/27.  Significant hospital events: 3/23 presented to ED at [place] w/ asthmatic exacerbation and LLL CAP. IVFs and ABX started.  3/24 increased WOB. LA increased from 5.9 to 8.7; intubated. COVID-19 sent. [facility] to [facility] as increased to high acuity rule-out.  3/25: Respiratory viral panel negative. FiO2 and PEEP requirements have improved. Hemodynamically stable. X-ray improved some. Starting tube feeds 3/26 looking better. Had brief desaturation episode during evening but on PSV and SBT am 3/26. Extubated 3/27: on high flow oxygen, chest x-ray with ongoing diffuse patchy pulmonary infiltrates. Treating with escalating diuretics. Completed course of azithromycin. Mobilizing, initiating diet, resuming home methadone and Klonopin regimen, changing sliding scale. 3/28: Transferred out of ICU  Subjective: Patient was seen and examined this morning.  Pleasant morbidly obese young Caucasian female.  Lying in bed.  Not in distress.  Still feels overloaded with fluid.  Wants a Foley catheter.  Assessment/Plan:  Active Problems:   CAP (community acquired pneumonia)   Acute respiratory failure ([place])   Pressure injury of skin  Community-acquired pneumonia/acute asthma exacerbation/acute respiratory with hypoxia -Primary reason for admission. -Completed course of azithromycin.  Currently on IV Rocephin. -COVID-19 negative.  Off isolation. -Required intubation on 3/24, extubated on 3/26 -Currently on low-flow oxygen via nasal cannula. -On ambulation today, patient required 2 L via nasal cannula to maintain saturation more than 90%.  NASH cirrhosis -Prior to admission, patient was on Aldactone  and torsemide.  Currently remains on the same. -Patient still feels overloaded with fluid.  -BUN/creatinine 12/0.52. -I will increase the dose of Aldactone and torsemide today.  Pancytopenia  -Secondary to cirrhosis. -Monitor for bleeding -Transfuse PRBC for Hgb < 7 gm/dL.  Hemoglobin 9.8 on last check yesterday. -CBC tomorrow.  Nutrition -low-sodium diet  Deconditioning  -PT eval obtained today.  Walker recommended.  DM2 with hyperglycemia -SSI.  Also Lantus 5 units at bedtime.  Metformin on hold.  History of IV drug abuse -On methadone replacement.  Depression/anxiety -Continue Klonopin.  Body mass index is 40.87 kg/m. Mobility: PT eval appreciated.  Walker recommended Diet: Fluid restriction to 1.5 L a day.  Diabetic diet DVT prophylaxis:  SCDs.  Avoid heparin because of low platelets. Code Status:   Code Status: Full Code  Family Communication:  Not at bedside  Disposition Plan:  Likely home in 1 to 2 days   Consultants:  Critical care signed off  Procedures:  Intubation/extubation  Antimicrobials:  Anti-infectives (From admission, onward)   Start     Dose/Rate Route Frequency Ordered Stop   01/24/19 2200  cefTRIAXone (ROCEPHIN) 2 g in sodium chloride 0.9 % 100 mL IVPB     2 g 200 mL/hr over 30 Minutes Intravenous Every 24 hours 01/24/19 1318     01/24/19 0430  cefTRIAXone (ROCEPHIN) 1 g in sodium chloride 0.9 % 100 mL IVPB     1 g 200 mL/hr over 30 Minutes Intravenous  Once 01/24/19 0422 01/24/19 0703   01/24/19 0430  azithromycin (ZITHROMAX) 500 mg in sodium chloride 0.9 % 250 mL IVPB  Status:  Discontinued     500 mg 250 mL/hr over 60 Minutes Intravenous Every 24 hours 01/24/19 0422 01/28/19 0922      Infusions:  . cefTRIAXone (  ROCEPHIN)  IV 2 g (01/28/19 2300)    Scheduled Meds: . Chlorhexidine Gluconate Cloth  6 each Topical Daily  . clonazePAM  1 mg Oral QID  . fluticasone furoate-vilanterol  1 puff Inhalation Daily  . insulin aspart   0-20 Units Subcutaneous TID WC  . insulin aspart  0-5 Units Subcutaneous QHS  . insulin glargine  5 Units Subcutaneous QHS  . methadone  50 mg Oral QHS  . multivitamin with minerals  1 tablet Oral Daily  . pantoprazole  40 mg Oral Daily  . spironolactone  50 mg Oral Daily  . torsemide  20 mg Oral Daily    PRN meds: acetaminophen, albuterol **AND** [DISCONTINUED] ipratropium, lactulose, sodium chloride flush   Objective: Vitals:   01/28/19 2104 01/29/19 0513  BP: 135/78 (!) 141/95  Pulse: 75 72  Resp: 20 19  Temp: 97.9 F (36.6 C) 98.2 F (36.8 C)  SpO2: 96% 92%    Intake/Output Summary (Last 24 hours) at 01/29/2019 1200 Last data filed at 01/29/2019 1100 Gross per 24 hour  Intake 840 ml  Output 1500 ml  Net -660 ml   Filed Weights   01/27/19 0500 01/28/19 0500 01/29/19 0513  Weight: 127.7 kg 126.5 kg 129.2 kg   Weight change: 2.7 kg Body mass index is 40.87 kg/m.   Physical Exam: General exam: Young Caucasian morbid obese female Skin: No rashes, lesions or ulcers. HEENT: Normal Lungs: Clear to auscultation bilaterally CVS: Regular rate and rhythm, no murmur GI/Abd soft, obese, nontender, bowel sound present CNS: Alert, awake, oriented x3 Psychiatry: Mood & affect appropriate.  Extremities: No pedal edema, no calf tenderness  Data Review: I have personally reviewed the laboratory data and studies available.  Recent Labs  Lab 01/24/19 0505 01/25/19 0424 01/26/19 0333 01/27/19 0621 01/28/19 0531  WBC 3.2* 3.1* 3.0* 3.0* 2.5*  NEUTROABS 2.4  --   --  1.8  --   HGB 14.0 11.1* 10.4* 10.8* 9.8*  HCT 43.5 34.4* 32.4* 35.1* 31.8*  MCV 89.3 90.1 90.8 92.6 92.2  PLT 65* 38* 56* 65* 63*   Recent Labs  Lab 01/25/19 0600 01/25/19 1149 01/25/19 1611 01/26/19 0333 01/26/19 1651 01/27/19 0621 01/28/19 0531 01/29/19 0549  NA 133*  --   --  133*  --  140 139 138  K 4.7  --   --  4.5  --  3.4* 3.4* 3.4*  CL 105  --   --  102  --  104 102 102  CO2 20*  --   --   23  --  27 29 28   GLUCOSE 297*  --   --  333*  --  110* 108* 116*  BUN 12  --   --  16  --  19 13 12   CREATININE 0.54  --   --  0.49  --  0.57 0.53 0.52  CALCIUM 8.0*  --   --  8.3*  --  8.2* 8.1* 8.3*  MG 1.9 2.0 2.1 2.0 1.9 1.9  --   --   PHOS 2.1* 2.2* 2.7 2.9 3.7  --   --   --    Recent Labs  Lab 01/24/19 0505 01/26/19 0333 01/27/19 0621  AST 33 13* 25  ALT 27 20 20   ALKPHOS 92 54 55  BILITOT 1.9* 0.6 0.8  PROT 7.2 6.0* 5.9*  ALBUMIN 3.6 2.8* 2.9*    Terrilee Croak, MD  [facility] 01/29/2019

## 2019-01-29 NOTE — Plan of Care (Signed)
  Problem: Pain Managment: Goal: General experience of comfort will improve Outcome: Progressing   Problem: Safety: Goal: Ability to remain free from injury will improve Outcome: Progressing   Problem: Elimination: Goal: Will not experience complications related to bowel motility Outcome: Progressing

## 2019-01-30 LAB — CBC WITH DIFFERENTIAL/PLATELET
Abs Immature Granulocytes: 0.06 10*3/uL (ref 0.00–0.07)
BASOS PCT: 0 %
Basophils Absolute: 0 10*3/uL (ref 0.0–0.1)
Eosinophils Absolute: 0.1 10*3/uL (ref 0.0–0.5)
Eosinophils Relative: 3 %
HCT: 35.1 % — ABNORMAL LOW (ref 36.0–46.0)
Hemoglobin: 11 g/dL — ABNORMAL LOW (ref 12.0–15.0)
Immature Granulocytes: 2 %
Lymphocytes Relative: 30 %
Lymphs Abs: 0.9 10*3/uL (ref 0.7–4.0)
MCH: 28.6 pg (ref 26.0–34.0)
MCHC: 31.3 g/dL (ref 30.0–36.0)
MCV: 91.4 fL (ref 80.0–100.0)
Monocytes Absolute: 0.3 10*3/uL (ref 0.1–1.0)
Monocytes Relative: 9 %
Neutro Abs: 1.5 10*3/uL — ABNORMAL LOW (ref 1.7–7.7)
Neutrophils Relative %: 56 %
PLATELETS: 76 10*3/uL — AB (ref 150–400)
RBC: 3.84 MIL/uL — ABNORMAL LOW (ref 3.87–5.11)
RDW: 13.2 % (ref 11.5–15.5)
WBC: 2.8 10*3/uL — ABNORMAL LOW (ref 4.0–10.5)
nRBC: 0 % (ref 0.0–0.2)

## 2019-01-30 LAB — HEPATIC FUNCTION PANEL
ALT: 28 U/L (ref 0–44)
AST: 31 U/L (ref 15–41)
Albumin: 2.8 g/dL — ABNORMAL LOW (ref 3.5–5.0)
Alkaline Phosphatase: 76 U/L (ref 38–126)
Bilirubin, Direct: 0.1 mg/dL (ref 0.0–0.2)
Indirect Bilirubin: 0.4 mg/dL (ref 0.3–0.9)
Total Bilirubin: 0.5 mg/dL (ref 0.3–1.2)
Total Protein: 5.9 g/dL — ABNORMAL LOW (ref 6.5–8.1)

## 2019-01-30 LAB — GLUCOSE, CAPILLARY
Glucose-Capillary: 124 mg/dL — ABNORMAL HIGH (ref 70–99)
Glucose-Capillary: 138 mg/dL — ABNORMAL HIGH (ref 70–99)
Glucose-Capillary: 118 mg/dL — ABNORMAL HIGH (ref 70–99)

## 2019-01-30 LAB — BASIC METABOLIC PANEL
Anion gap: 8 (ref 5–15)
BUN: 13 mg/dL (ref 6–20)
CO2: 27 mmol/L (ref 22–32)
Calcium: 8.5 mg/dL — ABNORMAL LOW (ref 8.9–10.3)
Chloride: 103 mmol/L (ref 98–111)
Creatinine, Ser: 0.55 mg/dL (ref 0.44–1.00)
GFR calc Af Amer: >60 mL/min (ref >60)
GFR calc non Af Amer: >60 mL/min (ref >60)
GLUCOSE: 102 mg/dL — AB (ref 70–99)
Potassium: 3.4 mmol/L — ABNORMAL LOW (ref 3.5–5.1)
Sodium: 138 mmol/L (ref 135–145)

## 2019-01-30 LAB — MAGNESIUM: Magnesium: 1.9 mg/dL (ref 1.7–2.4)

## 2019-01-30 MED ORDER — SPIRONOLACTONE 50 MG PO TABS: 100.0000 mg | ORAL_TABLET | Freq: Every day | ORAL | Status: DC | PRN

## 2019-01-30 NOTE — Progress Notes (Signed)
SATURATION QUALIFICATIONS: (This note is used to comply with regulatory documentation for home oxygen)  Patient Saturations on Room Air at Rest = 95%  Patient Saturations on Room Air while Ambulating = 92%  Patient Saturations on 0 Liters of oxygen while Ambulating = 92 %  Please briefly explain why patient needs home oxygen: no oxygen needed

## 2019-01-30 NOTE — Progress Notes (Signed)
PROGRESS NOTE  Jaime [name] KNL:[id number] DOB: Jul 20, 1986 DOA: 01/24/2019 PCP: Sinda Du, MD   LOS: 6 days   Brief narrative: 33 y/o female with asthma, NASH and cirrhosis, admitted 3/24 wit CAP and asthma exacerbation. Intubated due to worsening infiltrates, ARDS. Extubated on 3/27.  Significant hospital events: 3/23 presented to ED at [facility] w/ asthmatic exacerbation and LLL CAP. IVFs and ABX started.  3/24 increased WOB. LA increased from 5.9 to 8.7; intubated. COVID-19 sent. Transferred to [facility] as increased to high acuity rule-out.  3/25: Respiratory viral panel negative. FiO2 and PEEP requirements have improved. Hemodynamically stable. X-ray improved some. Starting tube feeds 3/26 looking better. Had brief desaturation episode during evening but on PSV and SBT am 3/26. Extubated 3/27: on high flow oxygen, chest x-ray with ongoing diffuse patchy pulmonary infiltrates. Treating with escalating diuretics. Completed course of azithromycin. Mobilizing, initiating diet, resuming home methadone and Klonopin regimen, changing sliding scale. 3/28: Transferred out of ICU.  Subjective: Patient was seen and examined this morning.  Pleasant young Caucasian female.  Morbidly obese.  Lying down in bed.  Not in distress  Assessment/Plan:  Active Problems:   CAP (community acquired pneumonia)   Acute respiratory failure (HCC)   Pressure injury of skin  Community-acquired pneumonia/acute asthma exacerbation/acute respiratory with hypoxia -Primary reason for admission. -Completed course of azithromycin.  Currently on IV Rocephin. -COVID-19 negative.  Off isolation. -Required intubation on 3/24, extubated on 3/26 -Currently on low-flow oxygen via nasal cannula. -On ambulation yesterday, patient required 2 L via nasal cannula to maintain saturation more than 90%.  NASH cirrhosis -Prior to admission, patient was on Aldactone and torsemide.  Currently remains on the same.  -Patient still feels overloaded with fluid.  -BUN/creatinine 12/0.52. -6 L negative balance since admission.  Pancytopenia  -Secondary to cirrhosis.  Currently stable counts -Monitor for bleeding -Transfuse PRBC for Hgb < 7 gm/dL.   Nutrition -low-sodium diet  Deconditioning  -PT eval obtained today.  Walker recommended.  DM2 with hyperglycemia -SSI.  Also Lantus 5 units at bedtime.  Metformin on hold.  History of IV drug abuse -On methadone replacement.  Depression/anxiety -Continue Klonopin.  Body mass index is 40.87 kg/m. Mobility: PT eval appreciated.  Walker recommended Diet: Fluid restriction to 1.5 L a day.  Diabetic diet DVT prophylaxis: SCDs.  Avoid heparin because of low platelets. Code Status:  Code Status: Full Code  Family Communication: Not at bedside  Disposition Plan:  Likely home this afternoon versus tomorrow.  Needs home oxygen set up.  Consultants:  Critical care signed off  Procedures:  Intubation/extubation  Antimicrobials:  Anti-infectives (From admission, onward)   Start     Dose/Rate Route Frequency Ordered Stop   01/24/19 2200  cefTRIAXone (ROCEPHIN) 2 g in sodium chloride 0.9 % 100 mL IVPB     2 g 200 mL/hr over 30 Minutes Intravenous Every 24 hours 01/24/19 1318     01/24/19 0430  cefTRIAXone (ROCEPHIN) 1 g in sodium chloride 0.9 % 100 mL IVPB     1 g 200 mL/hr over 30 Minutes Intravenous  Once 01/24/19 0422 01/24/19 0703   01/24/19 0430  azithromycin (ZITHROMAX) 500 mg in sodium chloride 0.9 % 250 mL IVPB  Status:  Discontinued     500 mg 250 mL/hr over 60 Minutes Intravenous Every 24 hours 01/24/19 0422 01/28/19 0922      Infusions:  . cefTRIAXone (ROCEPHIN)  IV 2 g (01/29/19 2111)    Scheduled Meds: . Chlorhexidine Gluconate Cloth  6  each Topical Daily  . clonazePAM  1 mg Oral QID  . fluticasone furoate-vilanterol  1 puff Inhalation Daily  . insulin aspart  0-20 Units Subcutaneous TID WC  . insulin aspart   0-5 Units Subcutaneous QHS  . insulin glargine  5 Units Subcutaneous QHS  . methadone  50 mg Oral QHS  . multivitamin with minerals  1 tablet Oral Daily  . pantoprazole  40 mg Oral Daily  . spironolactone  100 mg Oral Daily  . torsemide  40 mg Oral Daily    PRN meds: acetaminophen, albuterol **AND** [DISCONTINUED] ipratropium, lactulose, sodium chloride flush   Objective: Vitals:   01/30/19 0521 01/30/19 0917  BP: 130/78   Pulse: 88   Resp: 18   Temp: 98.3 F (36.8 C)   SpO2: 93% 94%    Intake/Output Summary (Last 24 hours) at 01/30/2019 0958 Last data filed at 01/29/2019 1700 Gross per 24 hour  Intake 480 ml  Output 950 ml  Net -470 ml   Filed Weights   01/28/19 0500 01/29/19 0513 01/30/19 0521  Weight: 126.5 kg 129.2 kg 125.2 kg   Weight change: -4 kg Body mass index is 39.6 kg/m.   Physical Exam: General exam: Appears calm and comfortable.  Morbidly obese Skin: No rashes, lesions or ulcers. HEENT: Normal Lungs: Clear to auscultation bilaterally CVS: Regular rate and rhythm, no murmur GI/Abd soft, nontender, nondistended, bowel sounds present CNS: Alert, awake, oriented x3 Psychiatry: Mood & affect appropriate.  Extremities: No pedal edema, no calf tenderness  Data Review: I have personally reviewed the laboratory data and studies available.  Recent Labs  Lab 01/24/19 0505 01/25/19 0424 01/26/19 0333 01/27/19 0621 01/28/19 0531 01/30/19 0325  WBC 3.2* 3.1* 3.0* 3.0* 2.5* 2.8*  NEUTROABS 2.4  --   --  1.8  --  1.5*  HGB 14.0 11.1* 10.4* 10.8* 9.8* 11.0*  HCT 43.5 34.4* 32.4* 35.1* 31.8* 35.1*  MCV 89.3 90.1 90.8 92.6 92.2 91.4  PLT 65* 38* 56* 65* 63* 76*   Recent Labs  Lab 01/25/19 0600 01/25/19 1149 01/25/19 1611 01/26/19 0333 01/26/19 1651 01/27/19 0621 01/28/19 0531 01/29/19 0549 01/30/19 0325  NA 133*  --   --  133*  --  140 139 138 138  K 4.7  --   --  4.5  --  3.4* 3.4* 3.4* 3.4*  CL 105  --   --  102  --  104 102 102 103  CO2 20*   --   --  23  --  27 29 28 27   GLUCOSE 297*  --   --  333*  --  110* 108* 116* 102*  BUN 12  --   --  16  --  19 13 12 13   CREATININE 0.54  --   --  0.49  --  0.57 0.53 0.52 0.55  CALCIUM 8.0*  --   --  8.3*  --  8.2* 8.1* 8.3* 8.5*  MG 1.9 2.0 2.1 2.0 1.9 1.9  --   --  1.9  PHOS 2.1* 2.2* 2.7 2.9 3.7  --   --   --   --    Recent Labs  Lab 01/24/19 0505 01/26/19 0333 01/27/19 0621 01/30/19 0325  AST 33 13* 25 31  ALT 27 20 20 28   ALKPHOS 92 54 55 76  BILITOT 1.9* 0.6 0.8 0.5  PROT 7.2 6.0* 5.9* 5.9*  ALBUMIN 3.6 2.8* 2.9* 2.8*    Terrilee Croak, MD  [facility] 01/30/2019

## 2019-01-30 NOTE — Discharge Summary (Signed)
Physician Discharge Summary  Jaime [name]:[id number] DOB: 10-Dec-1985 DOA: 01/24/2019  PCP: Sinda Du, MD  Admit date: 01/24/2019 Discharge date: 01/30/2019  Admitted From: Home Discharge disposition: home    Code Status: Full Code   Recommendations for Outpatient Follow-Up:   1. Ensure compliance with fluid restriction, diuretics. 2. Ensure compliance with regular meds. 3. Encourage weight loss.  Discharge Diagnosis:   Active Problems:   Pressure injury of skin    History of Present Illness / Brief narrative:  33 y/o female with asthma, NASH and cirrhosis, admitted 3/24 wit CAP and asthma exacerbation. Intubated due to worsening infiltrates, ARDS. Extubated on 3/27.  Significant hospital events: 3/23 presented to ED at [facility] w/ asthmatic exacerbation and LLL CAP. IVFs and ABX started.  3/24 increased WOB. LA increased from 5.9 to 8.7; intubated. COVID-19 sent. Transferred to [facility] as increased to high acuity rule-out.  3/25: Respiratory viral panel negative. FiO2 and PEEP requirements have improved. Hemodynamically stable. X-ray improved some. Starting tube feeds 3/26 looking better. Had brief desaturation episode during evening but on PSV and SBT am 3/26. Extubated 3/27:on high flow oxygen, chest x-ray with ongoing diffuse patchy pulmonary infiltrates. Treating with escalating diuretics. Completedcourse ofazithromycin. Mobilizing, initiating diet, resuming home methadone and Klonopin regimen, changing sliding scale. 3/28:Transferred out of ICU.   Hospital Course:  Community-acquired pneumonia/acute asthma exacerbation/acute respiratory with hypoxia -Primary reason for admission. -Completed course of azithromycin and IV Rocephin. -COVID-19 negative. Off isolation. -Required intubation on 3/24, extubated on 3/26 -Gradually weaned down from oxygen supplementation -On ambulation  today, patient maintain oxygen saturation over 90% without oxygen  supplementation.  NASH cirrhosis -Prior to admission, patient was on Aldactone and torsemide. Currently remains on the same. -Patient still feels overloaded with fluid. -BUN/creatinine 12/0.52. -6 L negative balance since admission.  Pancytopenia -Secondary to cirrhosis.  Currently stable counts -Monitor for bleeding  Nutrition -low-sodiumdiet/diabetic diet  Deconditioning -PT eval obtained today. Walker recommended.  DM2 with hyperglycemia Resume metformin post discharge.  History of IV drug abuse -On methadone replacement.  Depression/anxiety -Continue Klonopin.  Stable for discharge to home.  Medical Consultants:    Pulmonology   Subjective:  Patient was seen and examined this morning.  Pleasant young female.  Morbidly obese.  Not in distress.  Discharge Exam:   Vitals:   01/29/19 2016 01/30/19 0521 01/30/19 0917 01/30/19 1303  BP: 109/64 130/78    Pulse: 85 88  (!) 104  Resp: 18 18    Temp: 97.8 F (36.6 C) 98.3 F (36.8 C)    TempSrc: Oral Oral    SpO2: 94% 93% 94% 98%  Weight:  125.2 kg    Height:        Body mass index is 39.6 kg/m.  General exam: Appears calm and comfortable.  Morbidly obese Skin: No rashes, lesions or ulcers. HEENT: Normal Lungs: Clear to auscultation bilaterally CVS: Regular rate and rhythm, no murmur GI/Abd soft, nontender, nondistended, bowel sounds present CNS: Alert, awake, oriented x3 Psychiatry: Mood & affect appropriate.  Extremities: No pedal edema, no calf tenderness  Discharge Instructions:  Wound care: None Discharge Instructions    Diet - low sodium heart healthy   Complete by:  As directed    Increase activity slowly   Complete by:  As directed       Allergies as of 01/30/2019      Reactions   Iodine-131 Anaphylaxis, Shortness Of Breath, Swelling   Ivp Dye [iodinated Diagnostic Agents] Anaphylaxis   Skin gets  very red, unable to walk   Ketorolac Tromethamine Shortness Of Breath    Tylenol [acetaminophen] Other (See Comments)   Due to cirrhosis   Nsaids Other (See Comments)   Flares ulcers   Vancomycin Swelling   Facial swelling,       Medication List    TAKE these medications   albuterol (2.5 MG/3ML) 0.083% nebulizer solution Commonly known as:  PROVENTIL Take 2.5 mg by nebulization every 6 (six) hours as needed for wheezing or shortness of breath.   Belsomra 20 MG Tabs Generic drug:  Suvorexant Take 20 mg by mouth at bedtime.   Biofreeze 10 % Aero Generic drug:  Menthol Apply 1 application topically as needed (pain).   Breo Ellipta 100-25 MCG/INH Aepb Generic drug:  fluticasone furoate-vilanterol Inhale 1 puff into the lungs daily.   cetirizine 10 MG tablet Commonly known as:  ZYRTEC Take 10 mg by mouth every evening.   clonazePAM 1 MG tablet Commonly known as:  KLONOPIN Take 1 mg by mouth 4 (four) times daily.   diclofenac sodium 1 % Gel Commonly known as:  VOLTAREN Apply 2 g topically 4 (four) times daily as needed (pain).   doxycycline 100 MG capsule Commonly known as:  VIBRAMYCIN Take 1 capsule (100 mg total) by mouth 2 (two) times daily.   HYDROcodone-homatropine 5-1.5 MG/5ML syrup Commonly known as:  HYCODAN Take 5 mLs by mouth every 6 (six) hours as needed.   insulin NPH-regular Human (70-30) 100 UNIT/ML injection Inject 0-6 Units into the skin 3 (three) times daily. Per sliding scale  200= 2 units 250= 4 units 350= 6 units 400= Go to  ER   insulin regular 100 units/mL injection Commonly known as:  NOVOLIN R,HUMULIN R Inject 20 Units into the skin 2 (two) times daily before a meal.   lactulose 10 GM/15ML solution Commonly known as:  CHRONULAC Take 15 mLs by mouth 2 (two) times daily as needed for mild constipation or moderate constipation.   metFORMIN 500 MG 24 hr tablet Commonly known as:  GLUCOPHAGE-XR Take 1,000 mg by mouth 2 (two) times daily.   methadone 10 MG tablet Commonly known as:  DOLOPHINE Take 2 tablets  (20 mg total) by mouth every 12 (twelve) hours. What changed:    how much to take  when to take this   Nexplanon 68 MG Impl implant Generic drug:  etonogestrel 1 each by Subdermal route once.   pantoprazole 40 MG tablet Commonly known as:  PROTONIX Take 40 mg by mouth every morning.   potassium chloride SA 20 MEQ tablet Commonly known as:  K-DUR,KLOR-CON Take 20-40 tablets by mouth daily as needed (for low potassium).   prochlorperazine 10 MG tablet Commonly known as:  COMPAZINE Take 10 mg by mouth 4 (four) times daily as needed for nausea or vomiting.   promethazine 25 MG tablet Commonly known as:  PHENERGAN Take 25 mg by mouth daily as needed for nausea or vomiting.   spironolactone 50 MG tablet Commonly known as:  ALDACTONE Take 2 tablets (100 mg total) by mouth daily as needed (for fluid). What changed:  how much to take   sucralfate 1 g tablet Commonly known as:  CARAFATE Take 1 g by mouth 4 (four) times daily as needed (ulcers).   tetrahydrozoline 0.05 % ophthalmic solution Place 2 drops into both eyes 2 (two) times daily as needed (allergies/dry/itchy).   torsemide 20 MG tablet Commonly known as:  DEMADEX Take 20-40 mg by mouth daily as needed (for  fluid).   Trintellix 20 MG Tabs tablet Generic drug:  vortioxetine HBr Take 20 mg by mouth every evening.   zolpidem 10 MG tablet Commonly known as:  AMBIEN Take 1 tablet by mouth at bedtime.            Durable Medical Equipment  (From admission, onward)         Start     Ordered   01/30/19 1321  For home use only DME Gilford Rile  [facility])  Once    Question:  Patient needs a walker to treat with the following condition  Answer:  Mobility poor   01/30/19 1322          Time coordinating discharge: 35 minutes  The results of significant diagnostics from this hospitalization (including imaging, microbiology, ancillary and laboratory) are listed below for reference.    Procedures and Diagnostic  Studies:   Dg Abd 1 View  Result Date: 01/25/2019 CLINICAL DATA:  Advanced orogastric tube EXAM: ABDOMEN - 1 VIEW COMPARISON:  Yesterday FINDINGS: Advanced orogastric tube with tip at the distal stomach. Marked splenomegaly, known from prior imaging in this patient with cirrhosis. The visible bowel gas pattern is normal. IMPRESSION: 1. Advanced orogastric tube with tip at the distal stomach. 2. Splenomegaly. Electronically Signed   By: Monte Fantasia M.D.   On: 01/25/2019 05:13   Dg Chest Port 1 View  Result Date: 01/25/2019 CLINICAL DATA:  Pneumonia.  Endotracheal tube EXAM: PORTABLE CHEST 1 VIEW COMPARISON:  Yesterday FINDINGS: Endotracheal tube tip is 12 mm above the carina. The orogastric tube reaches the stomach. Right IJ line with tip at the upper cavoatrial junction. Left more than right airspace disease correlating with history of pneumonia. No visible effusion or pneumothorax. Prominent heart size. IMPRESSION: 1. Endotracheal tube tip is 12 mm above the carina. 2. History of pneumonia with extensive left more than right airspace disease. Electronically Signed   By: Monte Fantasia M.D.   On: 01/25/2019 05:29   Dg Chest Port 1 View  Result Date: 01/24/2019 CLINICAL DATA:  Central line placement. EXAM: PORTABLE CHEST 1 VIEW COMPARISON:  Radiograph of same day. FINDINGS: Stable cardiomediastinal silhouette. Endotracheal and nasogastric tubes are unchanged in position. Interval placement of right internal jugular catheter with distal tip in expected position of cavoatrial junction. No pneumothorax is noted. Significantly increased bilateral lung opacities are noted concerning for worsening pneumonia. Small bilateral pleural effusions may be present. Bony thorax is unremarkable. IMPRESSION: Endotracheal and nasogastric tubes are in grossly good position. Interval placement of right internal jugular catheter with distal tip in expected position of cavoatrial junction. No pneumothorax is noted.  Significantly increased bilateral lung opacities are noted concerning for worsening pneumonia. Electronically Signed   By: Marijo Conception, M.D.   On: 01/24/2019 17:49   Dg Chest Port 1 View  Result Date: 01/24/2019 CLINICAL DATA:  Intubated patient.  Respiratory distress. EXAM: PORTABLE CHEST 1 VIEW COMPARISON:  01/24/2019 at 10:57 a.m. and older exams. FINDINGS: Airspace lung opacities on the left are without change from the earlier exam. Airspace opacity noted in the right mid to upper lung on the prior study has mostly resolved. There is persistent right lung base airspace opacity. No new lung abnormalities. Endotracheal tube is stable, tip projecting 12 mm above the Carina. Nasal/orogastric tube passes below the diaphragm. Side hole lies at the expected location of the gastroesophageal junction. IMPRESSION: 1. Improved airspace opacity in the right mid to upper lung from the earlier study. Other lung opacities  are unchanged. 2. Stable support apparatus. Electronically Signed   By: Lajean Manes M.D.   On: 01/24/2019 14:13   Dg Chest Port 1 View  Result Date: 01/24/2019 CLINICAL DATA:  Pneumonia EXAM: PORTABLE CHEST 1 VIEW COMPARISON:  01/23/2019 FINDINGS: There is shallow lung inflation with bibasilar atelectasis. Previously described left basilar consolidation is less clearly demonstrated on this study perhaps due to hypoinflation. No pneumothorax. IMPRESSION: Shallow lung inflation and bibasilar atelectasis. Previously demonstrated left basilar consolidation is poorly visualized on the current study possibly due to underinflation. Electronically Signed   By: Ulyses Jarred M.D.   On: 01/24/2019 04:39   Dg Chest Port 1v Same Day  Result Date: 01/24/2019 CLINICAL DATA:  Enteric tube placement. EXAM: PORTABLE CHEST 1 VIEW COMPARISON:  Chest x-ray from same day at 9:54 a.m. FINDINGS: Endotracheal tube again seen with tip 2 cm above the carina. Interval placement of an enteric tube with the tip near the  gastroesophageal junction. Stable cardiomediastinal silhouette. Low lung volumes with unchanged patchy, asymmetric consolidation in both lungs. No pleural effusion or pneumothorax. No acute osseous abnormality. IMPRESSION: 1. Enteric tube tip near the gastroesophageal junction with the proximal side port in the distal esophagus. Recommend advancing 5 cm. 2. Unchanged patchy, asymmetric consolidation throughout both lungs consistent with pneumonia. This can be seen with atypical infection such as viral pneumonia. Electronically Signed   By: Titus Dubin M.D.   On: 01/24/2019 11:41   Dg Chest Port 1v Same Day  Result Date: 01/24/2019 CLINICAL DATA:  Follow-up pneumonia.  Intubation. EXAM: PORTABLE CHEST 1 VIEW 9:54 a.m.: COMPARISON:  Chest x-rays earlier same day at 4:26 a.m., yesterday and previously. FINDINGS: Endotracheal tube tip in satisfactory position projecting approximately 3 cm above the carina. Very low lung volumes. Chronic elevation of the RIGHT hemidiaphragm. Interval worsening of airspace consolidation throughout both lungs since earlier this morning, with the most confluent consolidation in the Mitchell and in the BILATERAL UPPER lobes. IMPRESSION: 1. Endotracheal tube tip in satisfactory position approximately 3 cm above the carina. 2. Rapidly progressive multilobar pneumonia throughout both lungs, most confluent in the LEFT LOWER LOBE and the BILATERAL UPPER lobes. Electronically Signed   By: Evangeline Dakin M.D.   On: 01/24/2019 10:24   Dg Abd Portable 1v  Result Date: 01/24/2019 CLINICAL DATA:  Orogastric tube placement. EXAM: PORTABLE ABDOMEN - 1 VIEW COMPARISON:  Radiographs of June 19, 2018. FINDINGS: The bowel gas pattern is normal. Distal tip of orogastric tube is seen in expected position of gastroesophageal junction. Status post cholecystectomy. IMPRESSION: Distal tip of orogastric tube seen in expected position of gastroesophageal junction. Advancement is recommended.  Electronically Signed   By: Marijo Conception, M.D.   On: 01/24/2019 17:52     Labs:   Basic Metabolic Panel: Recent Labs  Lab 01/25/19 0600 01/25/19 1149 01/25/19 1611 01/26/19 0333 01/26/19 1651 01/27/19 0621 01/28/19 0531 01/29/19 0549 01/30/19 0325  NA 133*  --   --  133*  --  140 139 138 138  K 4.7  --   --  4.5  --  3.4* 3.4* 3.4* 3.4*  CL 105  --   --  102  --  104 102 102 103  CO2 20*  --   --  23  --  27 29 28 27   GLUCOSE 297*  --   --  333*  --  110* 108* 116* 102*  BUN 12  --   --  16  --  19 13 12 13   CREATININE 0.54  --   --  0.49  --  0.57 0.53 0.52 0.55  CALCIUM 8.0*  --   --  8.3*  --  8.2* 8.1* 8.3* 8.5*  MG 1.9 2.0 2.1 2.0 1.9 1.9  --   --  1.9  PHOS 2.1* 2.2* 2.7 2.9 3.7  --   --   --   --    GFR Estimated Creatinine Clearance: 142.7 mL/min (by C-G formula based on SCr of 0.55 mg/dL). Liver Function Tests: Recent Labs  Lab 01/24/19 0505 01/26/19 0333 01/27/19 0621 01/30/19 0325  AST 33 13* 25 31  ALT 27 20 20 28   ALKPHOS 92 54 55 76  BILITOT 1.9* 0.6 0.8 0.5  PROT 7.2 6.0* 5.9* 5.9*  ALBUMIN 3.6 2.8* 2.9* 2.8*   No results for input(s): LIPASE, AMYLASE in the last 168 hours. No results for input(s): AMMONIA in the last 168 hours. Coagulation profile Recent Labs  Lab 01/24/19 1530  INR 1.4*    CBC: Recent Labs  Lab 01/24/19 0505 01/25/19 0424 01/26/19 0333 01/27/19 0621 01/28/19 0531 01/30/19 0325  WBC 3.2* 3.1* 3.0* 3.0* 2.5* 2.8*  NEUTROABS 2.4  --   --  1.8  --  1.5*  HGB 14.0 11.1* 10.4* 10.8* 9.8* 11.0*  HCT 43.5 34.4* 32.4* 35.1* 31.8* 35.1*  MCV 89.3 90.1 90.8 92.6 92.2 91.4  PLT 65* 38* 56* 65* 63* 76*   Cardiac Enzymes: No results for input(s): CKTOTAL, CKMB, CKMBINDEX, TROPONINI in the last 168 hours. BNP: Invalid input(s): POCBNP CBG: Recent Labs  Lab 01/29/19 1158 01/29/19 1612 01/29/19 2019 01/30/19 0751 01/30/19 1139  GLUCAP 149* 167* 119* 124* 138*   D-Dimer No results for input(s): DDIMER in the last  72 hours. Hgb A1c No results for input(s): HGBA1C in the last 72 hours. Lipid Profile No results for input(s): CHOL, HDL, LDLCALC, TRIG, CHOLHDL, LDLDIRECT in the last 72 hours. Thyroid function studies No results for input(s): TSH, T4TOTAL, T3FREE, THYROIDAB in the last 72 hours.  Invalid input(s): FREET3 Anemia work up No results for input(s): VITAMINB12, FOLATE, FERRITIN, TIBC, IRON, RETICCTPCT in the last 72 hours. Microbiology Recent Results (from the past 240 hour(s))  Culture, blood (routine x 2)     Status: None   Collection Time: 01/24/19  5:05 AM  Result Value Ref Range Status   Specimen Description RIGHT ANTECUBITAL  Final   Special Requests   Final    BOTTLES DRAWN AEROBIC AND ANAEROBIC Blood Culture adequate volume   Culture   Final    NO GROWTH 5 DAYS Performed at [facility], [address], [place], [place] [address]    Report Status 01/29/2019 FINAL  Final  Respiratory Panel by PCR     Status: None   Collection Time: 01/24/19  5:25 AM  Result Value Ref Range Status   Adenovirus NOT DETECTED NOT DETECTED Final   Coronavirus 229E NOT DETECTED NOT DETECTED Final    Comment: (NOTE) The Coronavirus on the Respiratory Panel, DOES NOT test for the novel  Coronavirus (2019 nCoV)    Coronavirus HKU1 NOT DETECTED NOT DETECTED Final   Coronavirus NL63 NOT DETECTED NOT DETECTED Final   Coronavirus OC43 NOT DETECTED NOT DETECTED Final   Metapneumovirus NOT DETECTED NOT DETECTED Final   Rhinovirus / Enterovirus NOT DETECTED NOT DETECTED Final   Influenza A NOT DETECTED NOT DETECTED Final   Influenza B NOT DETECTED NOT DETECTED Final   Parainfluenza Virus 1 NOT DETECTED  NOT DETECTED Final   Parainfluenza Virus 2 NOT DETECTED NOT DETECTED Final   Parainfluenza Virus 3 NOT DETECTED NOT DETECTED Final   Parainfluenza Virus 4 NOT DETECTED NOT DETECTED Final   Respiratory Syncytial Virus NOT DETECTED NOT DETECTED Final   Bordetella pertussis NOT DETECTED NOT DETECTED Final     Chlamydophila pneumoniae NOT DETECTED NOT DETECTED Final   Mycoplasma pneumoniae NOT DETECTED NOT DETECTED Final    Comment: Performed at [facility], [place] [address], [place], [place] [address]  Culture, blood (routine x 2)     Status: None   Collection Time: 01/24/19  5:25 AM  Result Value Ref Range Status   Specimen Description BLOOD LEFT HAND  Final   Special Requests   Final    BOTTLES DRAWN AEROBIC AND ANAEROBIC Blood Culture adequate volume   Culture   Final    NO GROWTH 5 DAYS Performed at [facility], [address] [place], [place] [address]    Report Status 01/29/2019 FINAL  Final  Culture, respiratory (tracheal aspirate)     Status: None   Collection Time: 01/24/19  1:13 PM  Result Value Ref Range Status   Specimen Description   Final    TRACHEAL ASPIRATE Performed at [place] [address], [place], [place] [address]    Special Requests   Final    NONE Performed at [facility], [place] [address], [place], [place] [address]    Gram Stain   Final    ABUNDANT WBC PRESENT,BOTH PMN AND MONONUCLEAR NO ORGANISMS SEEN Performed at [facility], [place] [address], [place], [place] [address]    Culture FEW CANDIDA TROPICALIS FEW CANDIDA ALBICANS   Final   Report Status 01/28/2019 FINAL  Final  MRSA PCR Screening     Status: None   Collection Time: 01/25/19  8:17 AM  Result Value Ref Range Status   MRSA by PCR NEGATIVE NEGATIVE Final    Comment:        The GeneXpert MRSA Assay (FDA approved for NASAL specimens only), is one component of a comprehensive MRSA colonization surveillance program. It is not intended to diagnose MRSA infection nor to guide or monitor treatment for MRSA infections. Performed at [facility], [place] [address] [place], [place] [address]     Signed: Marlowe Aschoff Phylisha Dix  [facility] 01/30/2019, 1:22 PM

## 2019-01-30 NOTE — TOC Transition Note (Signed)
Transition of Care [facility]) - CM/SW Discharge Note   Patient Details  Name: Jaime [name] MRN: [id number] Date of Birth: 08/28/1986  [facility] of [facility] CM/SW Contact:  Leeroy Cha, RN Phone Number: 01/30/2019, 1:37 PM   Clinical Narrative:    Discharge to return home. dme rollimng walker-[facility]   Final next level of care: Home/Self Care Barriers to Discharge: No Barriers Identified   Patient Goals and CMS Choice Patient states their goals for this hospitalization and ongoing recovery are:: I just want to go home      Discharge Placement                       Discharge Plan and Services   Discharge Planning Services: CM Consult Post Acute Care Choice: Durable Medical Equipment          DME Arranged: Walker rolling DME Agency: [employer]       Social Determinants of Health (SDOH) Interventions     Readmission Risk Interventions No flowsheet data found.

## 2019-01-31 LAB — NOVEL CORONAVIRUS, NAA (HOSP ORDER, SEND-OUT TO REF LAB; TAT 18-24 HRS): SARS-CoV-2, NAA: NOT DETECTED

## 2019-03-11 ENCOUNTER — Emergency Department (HOSPITAL_COMMUNITY): Payer: Medicaid Other

## 2019-03-11 ENCOUNTER — Encounter (HOSPITAL_COMMUNITY): Payer: Self-pay

## 2019-03-11 ENCOUNTER — Other Ambulatory Visit: Payer: Self-pay

## 2019-03-11 ENCOUNTER — Emergency Department (HOSPITAL_COMMUNITY)
Admission: EM | Admit: 2019-03-11 | Discharge: 2019-03-11 | Disposition: A | Discharge status: Home / Self Care | Payer: Medicaid Other | Attending: Emergency Medicine | Admitting: Emergency Medicine

## 2019-03-11 DIAGNOSIS — Z87891 Personal history of nicotine dependence: Secondary | ICD-10-CM | POA: Diagnosis not present

## 2019-03-11 DIAGNOSIS — E119 Type 2 diabetes mellitus without complications: Secondary | ICD-10-CM | POA: Diagnosis not present

## 2019-03-11 DIAGNOSIS — Z794 Long term (current) use of insulin: Secondary | ICD-10-CM | POA: Insufficient documentation

## 2019-03-11 DIAGNOSIS — R1013 Epigastric pain: Secondary | ICD-10-CM | POA: Diagnosis present

## 2019-03-11 DIAGNOSIS — J449 Chronic obstructive pulmonary disease, unspecified: Secondary | ICD-10-CM | POA: Diagnosis not present

## 2019-03-11 DIAGNOSIS — R110 Nausea: Secondary | ICD-10-CM | POA: Diagnosis not present

## 2019-03-11 DIAGNOSIS — Z79899 Other long term (current) drug therapy: Secondary | ICD-10-CM | POA: Diagnosis not present

## 2019-03-11 LAB — CBC WITH DIFFERENTIAL/PLATELET
Abs Immature Granulocytes: 0 10*3/uL (ref 0.00–0.07)
Basophils Absolute: 0 10*3/uL (ref 0.0–0.1)
Basophils Relative: 1 %
Eosinophils Absolute: 0.1 10*3/uL (ref 0.0–0.5)
Eosinophils Relative: 4 %
HCT: 37.3 % (ref 36.0–46.0)
Hemoglobin: 12 g/dL (ref 12.0–15.0)
Immature Granulocytes: 0 %
Lymphocytes Relative: 26 %
Lymphs Abs: 0.8 10*3/uL (ref 0.7–4.0)
MCH: 28.3 pg (ref 26.0–34.0)
MCHC: 32.2 g/dL (ref 30.0–36.0)
MCV: 88 fL (ref 80.0–100.0)
Monocytes Absolute: 0.3 10*3/uL (ref 0.1–1.0)
Monocytes Relative: 9 %
Neutro Abs: 2 10*3/uL (ref 1.7–7.7)
Neutrophils Relative %: 60 %
Platelets: 58 10*3/uL — ABNORMAL LOW (ref 150–400)
RBC: 4.24 MIL/uL (ref 3.87–5.11)
RDW: 13.6 % (ref 11.5–15.5)
WBC: 3.2 10*3/uL — ABNORMAL LOW (ref 4.0–10.5)
nRBC: 0 % (ref 0.0–0.2)

## 2019-03-11 LAB — LIPASE, BLOOD: Lipase: 23 U/L (ref 11–51)

## 2019-03-11 LAB — COMPREHENSIVE METABOLIC PANEL
ALT: 22 U/L (ref 0–44)
AST: 29 U/L (ref 15–41)
Albumin: 3.4 g/dL — ABNORMAL LOW (ref 3.5–5.0)
Alkaline Phosphatase: 92 U/L (ref 38–126)
Anion gap: 8 (ref 5–15)
BUN: 5 mg/dL — ABNORMAL LOW (ref 6–20)
CO2: 22 mmol/L (ref 22–32)
Calcium: 8.4 mg/dL — ABNORMAL LOW (ref 8.9–10.3)
Chloride: 108 mmol/L (ref 98–111)
Creatinine, Ser: 0.57 mg/dL (ref 0.44–1.00)
GFR calc Af Amer: >60 mL/min (ref >60)
GFR calc non Af Amer: >60 mL/min (ref >60)
Glucose, Bld: 113 mg/dL — ABNORMAL HIGH (ref 70–99)
Potassium: 3.5 mmol/L (ref 3.5–5.1)
Sodium: 138 mmol/L (ref 135–145)
Total Bilirubin: 0.7 mg/dL (ref 0.3–1.2)
Total Protein: 6.6 g/dL (ref 6.5–8.1)

## 2019-03-11 LAB — I-STAT BETA HCG BLOOD, ED (MC, WL, AP ONLY): I-stat hCG, quantitative: <5 m[IU]/mL (ref <5)

## 2019-03-11 MED ORDER — PANTOPRAZOLE SODIUM 40 MG IV SOLR
40.0000 mg | Freq: Once | INTRAVENOUS | Status: AC
  Administered 2019-03-11: 02:00:00 40 mg via INTRAVENOUS
  Filled 2019-03-11: qty 40

## 2019-03-11 MED ORDER — STERILE WATER FOR INJECTION IJ SOLN
INTRAMUSCULAR | Status: AC
  Administered 2019-03-11: 10 mL
  Filled 2019-03-11: qty 10

## 2019-03-11 MED ORDER — SODIUM CHLORIDE 0.9 % IV BOLUS
1000.0000 mL | Freq: Once | INTRAVENOUS | Status: AC
  Administered 2019-03-11: 02:00:00 1000 mL via INTRAVENOUS

## 2019-03-11 MED ORDER — ONDANSETRON 4 MG PO TBDP: ORAL_TABLET | ORAL | 0 refills | Status: DC

## 2019-03-11 MED ORDER — ONDANSETRON HCL 4 MG/2ML IJ SOLN
4.0000 mg | Freq: Once | INTRAMUSCULAR | Status: AC
  Administered 2019-03-11: 02:00:00 4 mg via INTRAVENOUS
  Filled 2019-03-11: qty 2

## 2019-03-11 MED ORDER — HYDROMORPHONE HCL 1 MG/ML IJ SOLN
0.5000 mg | Freq: Once | INTRAMUSCULAR | Status: AC
  Administered 2019-03-11: 0.5 mg via INTRAVENOUS
  Filled 2019-03-11: qty 1

## 2019-03-11 NOTE — ED Provider Notes (Signed)
[facility] EMERGENCY DEPARTMENT Provider Note   CSN: [id number] Arrival date & time: 03/11/19  0110    History   Chief Complaint Chief Complaint  Patient presents with  . Abdominal Pain    HPI Jaime [name] is a 33 y.o. female.     Patient complains of abdominal pain and nausea.  The history is provided by the patient. No language interpreter was used.  Abdominal Pain  Pain location:  Epigastric Pain radiates to:  Does not radiate Pain severity:  Mild Onset quality:  Sudden Timing:  Constant Progression:  Worsening Chronicity:  Recurrent Context: not alcohol use   Relieved by:  Nothing Associated symptoms: no chest pain, no cough, no diarrhea, no fatigue and no hematuria     Past Medical History:  Diagnosis Date  . Anxiety   . Chronic abdominal pain   . Chronic back pain   . Cirrhosis ([place])   . COPD (chronic obstructive pulmonary disease) ([place])   . Depression   . Diabetes mellitus without complication ([place])   . Hepatitis C   . Insomnia   . Long-term current use of methadone for opiate dependence ([place])   . Migraine headache   . Peptic ulcer     Patient Active Problem List   Diagnosis Date Noted  . Pressure injury of skin 01/29/2019  . Uncontrolled diabetes mellitus ([place]) 09/19/2018  . Hyperglycemia 09/17/2018  . Acute respiratory failure with hypoxia ([place]) 09/15/2018  . Anxiety 09/15/2018  . Chronic abdominal pain 09/15/2018  . Diabetes mellitus without complication ([place]) 47/42/5956  . Hepatitis C 09/15/2018  . Peptic ulcer 09/15/2018  . Long-term current use of methadone for opiate dependence ([place]) 09/15/2018  . Cirrhosis ([place]) 09/15/2018  . Acute hypoxemic respiratory failure ([place]) 07/17/2016  . Pleural effusion on right 07/17/2016  . Multiple rib fractures involving four or more ribs 07/17/2016  . Hemothorax on right 07/17/2016  . Chronic pain disorder 07/17/2016    Past Surgical History:  Procedure Laterality Date  . CHOLECYSTECTOMY       OB History    Gravida      Para      Term      Preterm      AB      Living  0     SAB      TAB      Ectopic      Multiple      Live Births               Home Medications    Prior to Admission medications   Medication Sig Start Date End Date Taking? Authorizing Provider  albuterol (PROVENTIL) (2.5 MG/3ML) 0.083% nebulizer solution Take 2.5 mg by nebulization every 6 (six) hours as needed for wheezing or shortness of breath.    [provider]  cetirizine (ZYRTEC) 10 MG tablet Take 10 mg by mouth every evening.    [provider]  clonazePAM (KLONOPIN) 1 MG tablet Take 1 mg by mouth 4 (four) times daily. 01/07/19   [provider]  diclofenac sodium (VOLTAREN) 1 % GEL Apply 2 g topically 4 (four) times daily as needed (pain).  08/13/18   [provider]  doxycycline (VIBRAMYCIN) 100 MG capsule Take 1 capsule (100 mg total) by mouth 2 (two) times daily. 01/23/19   Lily Kocher, PA-C  etonogestrel (NEXPLANON) 68 MG IMPL implant 1 each by Subdermal route once.    [provider]  fluticasone furoate-vilanterol (BREO ELLIPTA) 100-25 MCG/INH AEPB  Inhale 1 puff into the lungs daily.    [provider]  HYDROcodone-homatropine (HYCODAN) 5-1.5 MG/5ML syrup Take 5 mLs by mouth every 6 (six) hours as needed. 01/23/19   Lily Kocher, PA-C  insulin NPH-regular Human (70-30) 100 UNIT/ML injection Inject 0-6 Units into the skin 3 (three) times daily. Per sliding scale  200= 2 units 250= 4 units 350= 6 units 400= Go to  ER 08/24/18   [provider]  insulin regular (NOVOLIN R,HUMULIN R) 100 units/mL injection Inject 20 Units into the skin 2 (two) times daily before a meal.    [provider]  lactulose (CHRONULAC) 10 GM/15ML solution Take 15 mLs by mouth 2 (two) times daily as needed for mild constipation or moderate constipation.  08/24/18   [provider]  Menthol (BIOFREEZE) 10 % AERO Apply 1  application topically as needed (pain).    [provider]  metFORMIN (GLUCOPHAGE-XR) 500 MG 24 hr tablet Take 1,000 mg by mouth 2 (two) times daily.    [provider]  methadone (DOLOPHINE) 10 MG tablet Take 2 tablets (20 mg total) by mouth every 12 (twelve) hours. Patient taking differently: Take 50 mg by mouth at bedtime.  07/20/16   Sinda Du, MD  ondansetron (ZOFRAN ODT) 4 MG disintegrating tablet 25m ODT q4 hours prn nausea/vomit 03/11/19   ZMilton Ferguson MD  pantoprazole (PROTONIX) 40 MG tablet Take 40 mg by mouth every morning.     [provider]  potassium chloride SA (K-DUR,KLOR-CON) 20 MEQ tablet Take 20-40 tablets by mouth daily as needed (for low potassium).  07/28/18   [provider]  prochlorperazine (COMPAZINE) 10 MG tablet Take 10 mg by mouth 4 (four) times daily as needed for nausea or vomiting.  12/08/18   [provider]  promethazine (PHENERGAN) 25 MG tablet Take 25 mg by mouth daily as needed for nausea or vomiting.    [provider]  spironolactone (ALDACTONE) 50 MG tablet Take 2 tablets (100 mg total) by mouth daily as needed (for fluid). 01/30/19   DTerrilee Croak MD  sucralfate (CARAFATE) 1 g tablet Take 1 g by mouth 4 (four) times daily as needed (ulcers).     [provider]  Suvorexant (BELSOMRA) 20 MG TABS Take 20 mg by mouth at bedtime.     [provider]  tetrahydrozoline 0.05 % ophthalmic solution Place 2 drops into both eyes 2 (two) times daily as needed (allergies/dry/itchy).    [provider]  torsemide (DEMADEX) 20 MG tablet Take 20-40 mg by mouth daily as needed (for fluid).     [provider]  vortioxetine HBr (TRINTELLIX) 20 MG TABS tablet Take 20 mg by mouth every evening.     [provider]  zolpidem (AMBIEN) 10 MG tablet Take 1 tablet by mouth at bedtime.  03/14/18   [provider]    Family History Family History  Problem Relation Age of  Onset  . Hypertension Mother   . Hyperlipidemia Other     Social History Social History   Tobacco Use  . Smoking status: Former Smoker    Packs/day: 0.00    Years: 5.00    Pack years: 0.00  . Smokeless tobacco: Never Used  Substance Use Topics  . Alcohol use: No  . Drug use: No     Allergies   Iodine-131; Ivp dye [iodinated diagnostic agents]; Ketorolac tromethamine; Tylenol [acetaminophen]; Nsaids; and Vancomycin   Review of Systems Review of Systems  Constitutional: Negative  for appetite change and fatigue.  HENT: Negative for congestion, ear discharge and sinus pressure.   Eyes: Negative for discharge.  Respiratory: Negative for cough.   Cardiovascular: Negative for chest pain.  Gastrointestinal: Positive for abdominal pain. Negative for diarrhea.  Genitourinary: Negative for frequency and hematuria.  Musculoskeletal: Negative for back pain.  Skin: Negative for rash.  Neurological: Negative for seizures and headaches.  Psychiatric/Behavioral: Negative for hallucinations.     Physical Exam Updated Vital Signs BP 131/80   Pulse 87   Temp 98.6 F (37 C) (Oral)   Resp 15   Ht 5' 10"  (1.778 m)   Wt 120.2 kg   SpO2 95%   BMI 38.02 kg/m   Physical Exam Vitals signs and nursing note reviewed.  Constitutional:      Appearance: She is well-developed.  HENT:     Head: Normocephalic.     Nose: Nose normal.  Eyes:     General: No scleral icterus.    Conjunctiva/sclera: Conjunctivae normal.  Neck:     Musculoskeletal: Neck supple.     Thyroid: No thyromegaly.  Cardiovascular:     Rate and Rhythm: Normal rate and regular rhythm.     Heart sounds: No murmur. No friction rub. No gallop.   Pulmonary:     Breath sounds: No stridor. No wheezing or rales.  Chest:     Chest wall: No tenderness.  Abdominal:     General: There is no distension.     Tenderness: There is abdominal tenderness. There is no rebound.  Musculoskeletal: Normal range of motion.   Lymphadenopathy:     Cervical: No cervical adenopathy.  Skin:    Findings: No erythema or rash.  Neurological:     Mental Status: She is oriented to person, place, and time.     Motor: No abnormal muscle tone.     Coordination: Coordination normal.  Psychiatric:        Behavior: Behavior normal.      ED Treatments / Results  Labs (all labs ordered are listed, but only abnormal results are displayed) Labs Reviewed  CBC WITH DIFFERENTIAL/PLATELET - Abnormal; Notable for the following components:      Result Value   WBC 3.2 (*)    Platelets 58 (*)    All other components within normal limits  COMPREHENSIVE METABOLIC PANEL - Abnormal; Notable for the following components:   Glucose, Bld 113 (*)    BUN 5 (*)    Calcium 8.4 (*)    Albumin 3.4 (*)    All other components within normal limits  LIPASE, BLOOD  I-STAT BETA HCG BLOOD, ED ([facility], [facility], [facility] ONLY)    EKG None  Radiology Dg Abd Acute 2+v W 1v Chest  Result Date: 03/11/2019 CLINICAL DATA:  33 year old female with vomiting and abdominal pain. EXAM: DG ABDOMEN ACUTE W/ 1V CHEST COMPARISON:  Chest radiograph dated 01/28/2019 and CT of the abdomen pelvis dated 09/23/2018. FINDINGS: Evaluation is limited due to body habitus. There is no focal consolidation, pleural effusion, or pneumothorax. The cardiac silhouette is within normal limits. There is large amount of stool throughout the colon. No bowel dilatation or evidence of obstruction. No free air or radiopaque calculi. There is splenomegaly. Right upper quadrant cholecystectomy clips. No acute osseous pathology. IMPRESSION: Constipation.  No bowel obstruction. Electronically Signed   By: Anner Crete M.D.   On: 03/11/2019 02:53    Procedures Procedures (including critical care time)  Medications Ordered in ED Medications  sodium chloride 0.9 %  bolus 1,000 mL (1,000 mLs Intravenous New Bag/Given 03/11/19 0208)  pantoprazole (PROTONIX) injection 40 mg (40 mg Intravenous Given  03/11/19 0208)  ondansetron (ZOFRAN) injection 4 mg (4 mg Intravenous Given 03/11/19 0208)  HYDROmorphone (DILAUDID) injection 0.5 mg (0.5 mg Intravenous Given 03/11/19 0208)  sterile water (preservative free) injection (10 mLs  Given 03/11/19 0208)     Initial Impression / Assessment and Plan / ED Course  I have reviewed the triage vital signs and the nursing notes.  Pertinent labs & imaging results that were available during my care of the patient were reviewed by me and considered in my medical decision making (see chart for details).        Labs unremarkable.  Patient improved with nausea medicine and fluids.  Suspect some gastritis.  She will increase her Protonix to twice a day and is given Zofran and will follow-up with her PCP  Final Clinical Impressions(s) / ED Diagnoses   Final diagnoses:  Nausea    ED Discharge Orders         Ordered    ondansetron (ZOFRAN ODT) 4 MG disintegrating tablet     03/11/19 0347           Milton Ferguson, MD 03/11/19 [phone]

## 2019-03-11 NOTE — ED Triage Notes (Signed)
Pt states she awoke approx 1 hour ago with generalized abd pain and vomiting, denies diarrhea

## 2019-03-11 NOTE — Discharge Instructions (Addendum)
Follow up with your md next week.  Increase your protonix to twice a day for 3 days

## 2019-04-19 NOTE — Progress Notes (Deleted)
Psychiatric Initial Adult Assessment   Patient Identification: Jaime [name] MRN:  [id number] Date of Evaluation:  04/19/2019 Referral Source: Sinda Du, MD Chief Complaint:   Visit Diagnosis: No diagnosis found.  History of Present Illness:   Jaime [name] is a 33 y.o. year old female with a history of anxiety, opioid dependence on methadone, diabetes, COPD, asthma, NASH/cirrhosis, who is referred for   Associated Signs/Symptoms: Depression Symptoms:  {DEPRESSION SYMPTOMS:20000} (Hypo) Manic Symptoms:  {BHH MANIC SYMPTOMS:22872} Anxiety Symptoms:  {BHH ANXIETY SYMPTOMS:22873} Psychotic Symptoms:  {BHH PSYCHOTIC SYMPTOMS:22874} PTSD Symptoms: {BHH PTSD SYMPTOMS:22875}  Past Psychiatric History:  Outpatient:  Psychiatry admission:  Previous suicide attempt:  Past trials of medication:  History of violence:   Previous Psychotropic Medications: {YES/NO:21197}  Substance Abuse History in the last 12 months:  {yes no:314532}  Consequences of Substance Abuse: {BHH CONSEQUENCES OF SUBSTANCE ABUSE:22880}  Past Medical History:  Past Medical History:  Diagnosis Date  . Anxiety   . Chronic abdominal pain   . Chronic back pain   . Cirrhosis ([place])   . COPD (chronic obstructive pulmonary disease) ([place])   . Depression   . Diabetes mellitus without complication ([place])   . Hepatitis C   . Insomnia   . Long-term current use of methadone for opiate dependence ([place])   . Migraine headache   . Peptic ulcer     Past Surgical History:  Procedure Laterality Date  . CHOLECYSTECTOMY      Family Psychiatric History: ***  Family History:  Family History  Problem Relation Age of Onset  . Hypertension Mother   . Hyperlipidemia Other     Social History:   Social History   Socioeconomic History  . Marital status: Single    Spouse name: Not on file  . Number of children: Not on file  . Years of education: Not on file  . Highest education level: Not on file   Occupational History  . Not on file  Social Needs  . Financial resource strain: Not on file  . Food insecurity    Worry: Not on file    Inability: Not on file  . Transportation needs    Medical: Not on file    Non-medical: Not on file  Tobacco Use  . Smoking status: Former Smoker    Packs/day: 0.00    Years: 5.00    Pack years: 0.00  . Smokeless tobacco: Never Used  Substance and Sexual Activity  . Alcohol use: No  . Drug use: No  . Sexual activity: Never  Lifestyle  . Physical activity    Days per week: Not on file    Minutes per session: Not on file  . Stress: Not on file  Relationships  . Social Herbalist on phone: Not on file    Gets together: Not on file    Attends religious service: Not on file    Active member of club or organization: Not on file    Attends meetings of clubs or organizations: Not on file    Relationship status: Not on file  Other Topics Concern  . Not on file  Social History Narrative  . Not on file    Additional Social History: ***  Allergies:   Allergies  Allergen Reactions  . Iodine-131 Anaphylaxis, Shortness Of Breath and Swelling  . Ivp Dye [Iodinated Diagnostic Agents] Anaphylaxis    Skin gets very red, unable to walk   . Ketorolac Tromethamine Shortness Of Breath  . Tylenol [  Acetaminophen] Other (See Comments)    Due to cirrhosis   . Nsaids Other (See Comments)    Flares ulcers  . Vancomycin Swelling    Facial swelling,     Metabolic Disorder Labs: No results found for: HGBA1C, MPG No results found for: PROLACTIN Lab Results  Component Value Date   TRIG 122 01/24/2019   Lab Results  Component Value Date   TSH 1.690 08/22/2010    Therapeutic Level Labs: No results found for: LITHIUM No results found for: CBMZ No results found for: VALPROATE  Current Medications: Current Outpatient Medications  Medication Sig Dispense Refill  . albuterol (PROVENTIL) (2.5 MG/3ML) 0.083% nebulizer solution Take 2.5  mg by nebulization every 6 (six) hours as needed for wheezing or shortness of breath.    . cetirizine (ZYRTEC) 10 MG tablet Take 10 mg by mouth every evening.    . clonazePAM (KLONOPIN) 1 MG tablet Take 1 mg by mouth 4 (four) times daily.    . diclofenac sodium (VOLTAREN) 1 % GEL Apply 2 g topically 4 (four) times daily as needed (pain).   99  . doxycycline (VIBRAMYCIN) 100 MG capsule Take 1 capsule (100 mg total) by mouth 2 (two) times daily. 14 capsule 0  . etonogestrel (NEXPLANON) 68 MG IMPL implant 1 each by Subdermal route once.    . fluticasone furoate-vilanterol (BREO ELLIPTA) 100-25 MCG/INH AEPB Inhale 1 puff into the lungs daily.    Marland Kitchen HYDROcodone-homatropine (HYCODAN) 5-1.5 MG/5ML syrup Take 5 mLs by mouth every 6 (six) hours as needed. 100 mL 0  . insulin NPH-regular Human (70-30) 100 UNIT/ML injection Inject 0-6 Units into the skin 3 (three) times daily. Per sliding scale  200= 2 units 250= 4 units 350= 6 units 400= Go to  ER    . insulin regular (NOVOLIN R,HUMULIN R) 100 units/mL injection Inject 20 Units into the skin 2 (two) times daily before a meal.    . lactulose (CHRONULAC) 10 GM/15ML solution Take 15 mLs by mouth 2 (two) times daily as needed for mild constipation or moderate constipation.     . Menthol (BIOFREEZE) 10 % AERO Apply 1 application topically as needed (pain).    . metFORMIN (GLUCOPHAGE-XR) 500 MG 24 hr tablet Take 1,000 mg by mouth 2 (two) times daily.    . methadone (DOLOPHINE) 10 MG tablet Take 2 tablets (20 mg total) by mouth every 12 (twelve) hours. (Patient taking differently: Take 50 mg by mouth at bedtime. ) 120 tablet 0  . ondansetron (ZOFRAN ODT) 4 MG disintegrating tablet 79m ODT q4 hours prn nausea/vomit 12 tablet 0  . pantoprazole (PROTONIX) 40 MG tablet Take 40 mg by mouth every morning.     . potassium chloride SA (K-DUR,KLOR-CON) 20 MEQ tablet Take 20-40 tablets by mouth daily as needed (for low potassium).     . prochlorperazine (COMPAZINE) 10 MG  tablet Take 10 mg by mouth 4 (four) times daily as needed for nausea or vomiting.     . promethazine (PHENERGAN) 25 MG tablet Take 25 mg by mouth daily as needed for nausea or vomiting.    .Marland Kitchenspironolactone (ALDACTONE) 50 MG tablet Take 2 tablets (100 mg total) by mouth daily as needed (for fluid).    . sucralfate (CARAFATE) 1 g tablet Take 1 g by mouth 4 (four) times daily as needed (ulcers).     . Suvorexant (BELSOMRA) 20 MG TABS Take 20 mg by mouth at bedtime.     .Marland Kitchentetrahydrozoline 0.05 % ophthalmic solution  Place 2 drops into both eyes 2 (two) times daily as needed (allergies/dry/itchy).    . torsemide (DEMADEX) 20 MG tablet Take 20-40 mg by mouth daily as needed (for fluid).     . vortioxetine HBr (TRINTELLIX) 20 MG TABS tablet Take 20 mg by mouth every evening.     . zolpidem (AMBIEN) 10 MG tablet Take 1 tablet by mouth at bedtime.   5   No current facility-administered medications for this visit.     Musculoskeletal: Strength & Muscle Tone: N/A Gait & Station: N/A Patient leans: N/A  Psychiatric Specialty Exam: ROS  There were no vitals taken for this visit.There is no height or weight on file to calculate BMI.  General Appearance: {Appearance:22683}  Eye Contact:  {BHH EYE CONTACT:22684}  Speech:  Clear and Coherent  Volume:  Normal  Mood:  {BHH MOOD:22306}  Affect:  {Affect (PAA):22687}  Thought Process:  Coherent  Orientation:  Full (Time, Place, and Person)  Thought Content:  Logical  Suicidal Thoughts:  {ST/HT (PAA):22692}  Homicidal Thoughts:  {ST/HT (PAA):22692}  Memory:  Immediate;   Good  Judgement:  {Judgement (PAA):22694}  Insight:  {Insight (PAA):22695}  Psychomotor Activity:  Normal  Concentration:  Concentration: Good and Attention Span: Good  Recall:  Good  Fund of Knowledge:Good  Language: Good  Akathisia:  No  Handed:  Right  AIMS (if indicated):  not done  Assets:  Communication Skills Desire for Improvement  ADL's:  Intact  Cognition: WNL   Sleep:  {BHH GOOD/FAIR/POOR:22877}   Screenings:   Assessment and Plan:  Assessment  Plan  The patient demonstrates the following risk factors for suicide: Chronic risk factors for suicide include: {Chronic Risk Factors for PJKDTOI:71245809}. Acute risk factors for suicide include: {Acute Risk Factors for XIPJASN:05397673}. Protective factors for this patient include: {Protective Factors for Suicide ALPF:79024097}. Considering these factors, the overall suicide risk at this point appears to be {Desc; low/moderate/high:110033}. Patient {ACTION; IS/IS DZH:29924268} appropriate for outpatient follow up.   Norman Clay, MD 6/17/20201:59 PM

## 2019-04-25 ENCOUNTER — Ambulatory Visit (HOSPITAL_COMMUNITY): Payer: Medicaid Other | Admitting: Psychiatry

## 2019-04-25 ENCOUNTER — Other Ambulatory Visit: Payer: Self-pay
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
## 2019-05-15 ENCOUNTER — Ambulatory Visit (INDEPENDENT_AMBULATORY_CARE_PROVIDER_SITE_OTHER): Payer: Medicaid Other | Admitting: Psychiatry

## 2019-05-15 ENCOUNTER — Encounter (HOSPITAL_COMMUNITY): Payer: Self-pay | Admitting: Psychiatry

## 2019-05-15 ENCOUNTER — Other Ambulatory Visit: Payer: Self-pay

## 2019-05-15 DIAGNOSIS — F1121 Opioid dependence, in remission: Secondary | ICD-10-CM

## 2019-05-15 DIAGNOSIS — F331 Major depressive disorder, recurrent, moderate: Secondary | ICD-10-CM

## 2019-05-15 MED ORDER — VENLAFAXINE HCL ER 37.5 MG PO CP24: ORAL_CAPSULE | ORAL | 0 refills | Status: DC

## 2019-05-15 NOTE — Patient Instructions (Signed)
1. Increase venlafaxine 187.5 mg (150 mg + 37.5 mg) daily  2. Continue lorazepam 1 mg up to four times a day as needed for anxiety  3. Continue Ambien 10 mg at night as needed for insomnia  4. Next appointment: 8/13 at 3 PM  5. Referral to therapy

## 2019-05-26 ENCOUNTER — Emergency Department (HOSPITAL_COMMUNITY): Payer: Medicaid Other

## 2019-05-26 ENCOUNTER — Other Ambulatory Visit: Payer: Self-pay

## 2019-05-26 ENCOUNTER — Encounter (HOSPITAL_COMMUNITY): Payer: Self-pay | Admitting: Emergency Medicine

## 2019-05-26 ENCOUNTER — Emergency Department (HOSPITAL_COMMUNITY)
Admission: EM | Admit: 2019-05-26 | Discharge: 2019-05-26 | Disposition: A | Discharge status: Home / Self Care | Payer: Medicaid Other | Attending: Emergency Medicine | Admitting: Emergency Medicine

## 2019-05-26 DIAGNOSIS — R197 Diarrhea, unspecified: Secondary | ICD-10-CM | POA: Diagnosis not present

## 2019-05-26 DIAGNOSIS — R1011 Right upper quadrant pain: Secondary | ICD-10-CM | POA: Diagnosis present

## 2019-05-26 DIAGNOSIS — J449 Chronic obstructive pulmonary disease, unspecified: Secondary | ICD-10-CM | POA: Insufficient documentation

## 2019-05-26 DIAGNOSIS — Z794 Long term (current) use of insulin: Secondary | ICD-10-CM | POA: Diagnosis not present

## 2019-05-26 DIAGNOSIS — Z87891 Personal history of nicotine dependence: Secondary | ICD-10-CM | POA: Diagnosis not present

## 2019-05-26 DIAGNOSIS — R1012 Left upper quadrant pain: Secondary | ICD-10-CM | POA: Diagnosis not present

## 2019-05-26 DIAGNOSIS — E119 Type 2 diabetes mellitus without complications: Secondary | ICD-10-CM | POA: Diagnosis not present

## 2019-05-26 DIAGNOSIS — Z87898 Personal history of other specified conditions: Secondary | ICD-10-CM

## 2019-05-26 DIAGNOSIS — Z79899 Other long term (current) drug therapy: Secondary | ICD-10-CM | POA: Insufficient documentation

## 2019-05-26 DIAGNOSIS — R509 Fever, unspecified: Secondary | ICD-10-CM

## 2019-05-26 DIAGNOSIS — R112 Nausea with vomiting, unspecified: Secondary | ICD-10-CM | POA: Diagnosis not present

## 2019-05-26 DIAGNOSIS — R1010 Upper abdominal pain, unspecified: Secondary | ICD-10-CM

## 2019-05-26 DIAGNOSIS — Z20828 Contact with and (suspected) exposure to other viral communicable diseases: Secondary | ICD-10-CM | POA: Diagnosis not present

## 2019-05-26 LAB — URINALYSIS, ROUTINE W REFLEX MICROSCOPIC
Bilirubin Urine: NEGATIVE
Glucose, UA: NEGATIVE mg/dL
Hgb urine dipstick: NEGATIVE
Ketones, ur: NEGATIVE mg/dL
Leukocytes,Ua: NEGATIVE
Nitrite: NEGATIVE
Protein, ur: NEGATIVE mg/dL
Specific Gravity, Urine: 1.017 (ref 1.005–1.030)
pH: 6 (ref 5.0–8.0)

## 2019-05-26 LAB — CBC
HCT: 40.4 % (ref 36.0–46.0)
Hemoglobin: 13.2 g/dL (ref 12.0–15.0)
MCH: 28.3 pg (ref 26.0–34.0)
MCHC: 32.7 g/dL (ref 30.0–36.0)
MCV: 86.7 fL (ref 80.0–100.0)
Platelets: 71 10*3/uL — ABNORMAL LOW (ref 150–400)
RBC: 4.66 MIL/uL (ref 3.87–5.11)
RDW: 13.9 % (ref 11.5–15.5)
WBC: 4.3 10*3/uL (ref 4.0–10.5)
nRBC: 0 % (ref 0.0–0.2)

## 2019-05-26 LAB — DIFFERENTIAL
Abs Immature Granulocytes: 0.01 10*3/uL (ref 0.00–0.07)
Basophils Absolute: 0 10*3/uL (ref 0.0–0.1)
Basophils Relative: 1 %
Eosinophils Absolute: 0.1 10*3/uL (ref 0.0–0.5)
Eosinophils Relative: 3 %
Immature Granulocytes: 0 %
Lymphocytes Relative: 20 %
Lymphs Abs: 0.9 10*3/uL (ref 0.7–4.0)
Monocytes Absolute: 0.4 10*3/uL (ref 0.1–1.0)
Monocytes Relative: 8 %
Neutro Abs: 3 10*3/uL (ref 1.7–7.7)
Neutrophils Relative %: 68 %

## 2019-05-26 LAB — COMPREHENSIVE METABOLIC PANEL
ALT: 26 U/L (ref 0–44)
AST: 31 U/L (ref 15–41)
Albumin: 3.5 g/dL (ref 3.5–5.0)
Alkaline Phosphatase: 89 U/L (ref 38–126)
Anion gap: 9 (ref 5–15)
BUN: 5 mg/dL — ABNORMAL LOW (ref 6–20)
CO2: 21 mmol/L — ABNORMAL LOW (ref 22–32)
Calcium: 8.7 mg/dL — ABNORMAL LOW (ref 8.9–10.3)
Chloride: 108 mmol/L (ref 98–111)
Creatinine, Ser: 0.41 mg/dL — ABNORMAL LOW (ref 0.44–1.00)
GFR calc Af Amer: >60 mL/min (ref >60)
GFR calc non Af Amer: >60 mL/min (ref >60)
Glucose, Bld: 134 mg/dL — ABNORMAL HIGH (ref 70–99)
Potassium: 3.7 mmol/L (ref 3.5–5.1)
Sodium: 138 mmol/L (ref 135–145)
Total Bilirubin: 0.7 mg/dL (ref 0.3–1.2)
Total Protein: 7.1 g/dL (ref 6.5–8.1)

## 2019-05-26 LAB — PREGNANCY, URINE: Preg Test, Ur: NEGATIVE

## 2019-05-26 LAB — SARS CORONAVIRUS 2 BY RT PCR (HOSPITAL ORDER, PERFORMED IN ~~LOC~~ HOSPITAL LAB): SARS Coronavirus 2: NEGATIVE

## 2019-05-26 LAB — LACTIC ACID, PLASMA: Lactic Acid, Venous: 1.8 mmol/L (ref 0.5–1.9)

## 2019-05-26 LAB — LIPASE, BLOOD: Lipase: 21 U/L (ref 11–51)

## 2019-05-26 LAB — GLUCOSE, CAPILLARY: Glucose-Capillary: 136 mg/dL — ABNORMAL HIGH (ref 70–99)

## 2019-05-26 MED ORDER — SODIUM CHLORIDE 0.9 % IV BOLUS
1000.0000 mL | Freq: Once | INTRAVENOUS | Status: AC
  Administered 2019-05-26: 1000 mL via INTRAVENOUS

## 2019-05-26 MED ORDER — METOCLOPRAMIDE HCL 5 MG/ML IJ SOLN
10.0000 mg | Freq: Once | INTRAMUSCULAR | Status: AC
  Administered 2019-05-26: 10 mg via INTRAVENOUS
  Filled 2019-05-26: qty 2

## 2019-05-26 MED ORDER — ONDANSETRON HCL 4 MG/2ML IJ SOLN
4.0000 mg | Freq: Once | INTRAMUSCULAR | Status: AC
  Administered 2019-05-26: 4 mg via INTRAVENOUS
  Filled 2019-05-26: qty 2

## 2019-05-26 MED ORDER — MORPHINE SULFATE (PF) 4 MG/ML IV SOLN
INTRAVENOUS | Status: AC
  Filled 2019-05-26: qty 1

## 2019-05-26 MED ORDER — MORPHINE SULFATE (PF) 4 MG/ML IV SOLN
4.0000 mg | Freq: Once | INTRAVENOUS | Status: AC
  Administered 2019-05-26: 4 mg via INTRAVENOUS

## 2019-05-26 MED ORDER — DEXAMETHASONE SODIUM PHOSPHATE 10 MG/ML IJ SOLN
10.0000 mg | Freq: Once | INTRAMUSCULAR | Status: AC
  Administered 2019-05-26: 10 mg via INTRAVENOUS
  Filled 2019-05-26: qty 1

## 2019-05-26 MED ORDER — DIPHENHYDRAMINE HCL 50 MG/ML IJ SOLN
25.0000 mg | Freq: Once | INTRAMUSCULAR | Status: AC
  Administered 2019-05-26: 25 mg via INTRAVENOUS
  Filled 2019-05-26: qty 1

## 2019-05-26 NOTE — Discharge Instructions (Addendum)
As discussed, your labs and CT scan tonight are stable.  I suspect you have a viral gastroenteritis that should run its course.  Make sure you are drinking plenty of fluids.  Use your phenergan or your compazine if needed for continued nausea.  The B.r.a.t diet is especially helpful with your symptoms (bananas, rice, applesauce and dry toast).

## 2019-05-26 NOTE — ED Provider Notes (Addendum)
[facility] EMERGENCY DEPARTMENT Provider Note   CSN: [id number] Arrival date & time: 05/26/19  1604    History   Chief Complaint Chief Complaint  Patient presents with   Abdominal Pain    HPI Jaime [name] is a 33 y.o. 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  She denies back pain, dysuria although urine has been darker than normal.  Also no rash, neck pain or stiffness.  She has taken tylenol for fever reduction even though she knows she is not supposed to given her cirrhosis.  No known covid exposures.     The history is provided by the patient.    Past Medical History:  Diagnosis Date   Anxiety    Chronic abdominal pain    Chronic back pain    Cirrhosis ([place])    COPD (chronic obstructive pulmonary disease) ([place])    Depression    Diabetes mellitus without complication ([place])    Hepatitis C    Insomnia    Long-term current use of methadone for opiate dependence ([place])    Migraine headache    Peptic ulcer     Patient Active Problem List   Diagnosis Date Noted   Pressure injury of skin 01/29/2019   Uncontrolled diabetes mellitus ([place]) 09/19/2018   Hyperglycemia 09/17/2018   Acute respiratory failure with hypoxia ([place]) 09/15/2018   Anxiety 09/15/2018   Chronic abdominal pain 09/15/2018   Diabetes mellitus without complication ([place]) 00/86/7619   Hepatitis C 09/15/2018   Peptic ulcer 09/15/2018   Long-term current use of methadone for opiate dependence ([place]) 09/15/2018   Cirrhosis ([place]) 09/15/2018   Acute hypoxemic  respiratory failure ([place]) 07/17/2016   Pleural effusion on right 07/17/2016   Multiple rib fractures involving four or more ribs 07/17/2016   Hemothorax on right 07/17/2016   Chronic pain disorder 07/17/2016    Past Surgical History:  Procedure Laterality Date   CHOLECYSTECTOMY       OB History    Gravida      Para      Term      Preterm      AB      Living  0     SAB      TAB      Ectopic      Multiple      Live Births               Home Medications    Prior to Admission medications   Medication Sig Start Date End Date Taking? Authorizing Provider  albuterol (PROVENTIL) (2.5 MG/3ML) 0.083% nebulizer solution Take 2.5 mg by nebulization every 6 (six) hours as needed for wheezing or shortness of breath.   Yes [provider]  insulin NPH-regular Human (70-30) 100 UNIT/ML injection Inject 0-6 Units into the skin 3 (three) times daily. Per sliding scale  200= 2 units 250= 4 units 350= 6 units 400= Go to  ER 08/24/18  Yes [provider]  insulin regular (NOVOLIN R,HUMULIN R) 100 units/mL injection Inject 10 Units into the skin 2 (two) times daily before a meal.  Yes [provider]  metFORMIN (GLUCOPHAGE-XR) 500 MG 24 hr tablet Take 1,000 mg by mouth 2 (two) times daily.   Yes [provider]  methadone (DOLOPHINE) 10 MG/5ML solution Take 10 mg by mouth every morning.   Yes [provider]  prochlorperazine (COMPAZINE) 10 MG tablet Take 10 mg by mouth 4 (four) times daily as needed for nausea or vomiting.  12/08/18  Yes [provider]  promethazine (PHENERGAN) 25 MG tablet Take 25 mg by mouth daily as needed for nausea or vomiting.   Yes [provider]  RESTASIS 0.05 % ophthalmic emulsion Place 1 drop into both eyes 2 (two) times daily.  04/22/19  Yes [provider]  Suvorexant (BELSOMRA) 20 MG TABS Take 20 mg by mouth at bedtime.    Yes [provider]  tetrahydrozoline  0.05 % ophthalmic solution Place 2 drops into both eyes 2 (two) times daily as needed (allergies/dry/itchy).   Yes [provider]  venlafaxine XR (EFFEXOR-XR) 150 MG 24 hr capsule Take 150 mg by mouth daily with breakfast.   Yes [provider]  venlafaxine XR (EFFEXOR-XR) 37.5 MG 24 hr capsule Take 187.5 mg daily (37.5 mg + 150 mg daily) 05/15/19  Yes Hisada, Reina, MD  zolpidem (AMBIEN) 10 MG tablet Take 1 tablet by mouth at bedtime.  03/14/18  Yes [provider]  etonogestrel (NEXPLANON) 68 MG IMPL implant 1 each by Subdermal route once.    [provider]    Family History Family History  Problem Relation Age of Onset   Hypertension Mother    Hyperlipidemia Other    Alcohol abuse Maternal Grandfather     Social History Social History   Tobacco Use   Smoking status: Former Smoker    Packs/day: 0.00    Years: 5.00    Pack years: 0.00   Smokeless tobacco: Never Used  Substance Use Topics   Alcohol use: No   Drug use: No     Allergies   Iodine-131, Ivp dye [iodinated diagnostic agents], Ketorolac tromethamine, Tylenol [acetaminophen], Nsaids, and Vancomycin   Review of Systems Review of Systems  Constitutional: Positive for appetite change, fatigue and fever.  HENT: Negative for congestion and sore throat.   Eyes: Negative.   Respiratory: Negative for cough, chest tightness and shortness of breath.   Cardiovascular: Negative for chest pain.  Gastrointestinal: Positive for abdominal pain, diarrhea, nausea and vomiting.  Genitourinary: Negative.   Musculoskeletal: Negative for arthralgias, joint swelling, neck pain and neck stiffness.  Skin: Negative.  Negative for rash and wound.  Neurological: Positive for headaches. Negative for dizziness, weakness, light-headedness and numbness.  Psychiatric/Behavioral: Negative.      Physical Exam Updated Vital Signs BP 130/77 (BP Location: Left Arm)    Pulse 100    Temp 98 F (36.7 C)  (Oral)    Resp 17    Ht 5' 9"  (1.753 m)    Wt 113.4 kg    SpO2 98%    BMI 36.92 kg/m   Physical Exam Vitals signs and nursing note reviewed.  Constitutional:      Appearance: She is well-developed.  HENT:     Head: Normocephalic and atraumatic.  Eyes:     Conjunctiva/sclera: Conjunctivae normal.  Neck:     Musculoskeletal: Normal range of motion.  Cardiovascular:     Rate and Rhythm: Regular rhythm. Tachycardia present.     Heart sounds: Normal heart sounds.  Pulmonary:     Effort: Pulmonary effort is normal.  Breath sounds: Normal breath sounds. No wheezing or rhonchi.  Abdominal:     General: Bowel sounds are normal.     Palpations: Abdomen is soft.     Tenderness: There is abdominal tenderness in the right upper quadrant, epigastric area and left upper quadrant. There is no guarding or rebound.     Hernia: No hernia is present.  Musculoskeletal: Normal range of motion.  Skin:    General: Skin is warm and dry.  Neurological:     General: No focal deficit present.     Mental Status: She is alert.     Motor: Motor function is intact.      ED Treatments / Results  Labs (all labs ordered are listed, but only abnormal results are displayed) Labs Reviewed  COMPREHENSIVE METABOLIC PANEL - Abnormal; Notable for the following components:      Result Value   CO2 21 (*)    Glucose, Bld 134 (*)    BUN 5 (*)    Creatinine, Ser 0.41 (*)    Calcium 8.7 (*)    All other components within normal limits  CBC - Abnormal; Notable for the following components:   Platelets 71 (*)    All other components within normal limits  URINALYSIS, ROUTINE W REFLEX MICROSCOPIC - Abnormal; Notable for the following components:   APPearance HAZY (*)    All other components within normal limits  GLUCOSE, CAPILLARY - Abnormal; Notable for the following components:   Glucose-Capillary 136 (*)    All other components within normal limits  CULTURE, BLOOD (ROUTINE X 2)  CULTURE, BLOOD (ROUTINE X  2) W REFLEX TO ID PANEL  SARS CORONAVIRUS 2 (HOSPITAL ORDER, [place] [facility])  LIPASE, BLOOD  PREGNANCY, URINE  DIFFERENTIAL  LACTIC ACID, PLASMA    EKG None  Radiology Ct Abdomen Pelvis Wo Contrast  Result Date: 05/26/2019 CLINICAL DATA:  Flank pain, generalized abdominal pain, nausea, vomiting diarrhea, fever EXAM: CT ABDOMEN AND PELVIS WITHOUT CONTRAST TECHNIQUE: Multidetector CT imaging of the abdomen and pelvis was performed following the standard protocol without IV contrast. COMPARISON:  09/23/2018 FINDINGS: Lower chest: No acute abnormality. Hepatobiliary: No focal liver abnormality is seen. Coarse, nodular contour of the liver. Status post cholecystectomy. No biliary dilatation. Pancreas: Unremarkable. No pancreatic ductal dilatation or surrounding inflammatory changes. Spleen: Extreme splenomegaly, maximum span 24.5 cm. Adrenals/Urinary Tract: Adrenal glands are unremarkable. Kidneys are normal, without renal calculi, solid lesion, or hydronephrosis. Bladder is unremarkable. Stomach/Bowel: Stomach is within normal limits. Appendix appears normal. No evidence of bowel wall thickening, distention, or inflammatory changes. Moderate burden of stool in the colon. Vascular/Lymphatic: No significant vascular findings are present. No enlarged abdominal or pelvic lymph nodes. Reproductive: No mass or other significant abnormality. Other: No abdominal wall hernia or abnormality. No abdominopelvic ascites. There is mild, diffuse retroperitoneal fat stranding, of unclear origin and unchanged from prior examination. Musculoskeletal: No acute or significant osseous findings. IMPRESSION: 1. No definite non-contrast CT findings of the abdomen or pelvis to explain pain, nausea, vomiting, diarrhea, or fever. 2.  No evidence of urinary tract calculus or hydronephrosis. 3.  Stigmata of cirrhosis and extreme splenomegaly. 4. There is mild, diffuse retroperitoneal fat stranding, of unclear  origin and unchanged from prior examination. This could reflect sequelae of prior inflammation, for example pancreatitis. Electronically Signed   By: Eddie Candle M.D.   On: 05/26/2019 19:13   Dg Chest Port 1 View  Result Date: 05/26/2019 CLINICAL DATA:  Generalized chest pain and  fevers, initial encounter EXAM: PORTABLE CHEST 1 VIEW COMPARISON:  01/28/2019 FINDINGS: The heart size and mediastinal contours are within normal limits. Both lungs are clear. The visualized skeletal structures are unremarkable. IMPRESSION: No active disease. Electronically Signed   By: Inez Catalina M.D.   On: 05/26/2019 17:05    Procedures Procedures (including critical care time)  Medications Ordered in ED Medications  sodium chloride 0.9 % bolus 1,000 mL (0 mLs Intravenous Stopped 05/26/19 2124)  ondansetron (ZOFRAN) injection 4 mg (4 mg Intravenous Given 05/26/19 1942)  morphine 4 MG/ML injection 4 mg (4 mg Intravenous Given 05/26/19 1942)  dexamethasone (DECADRON) injection 10 mg (10 mg Intravenous Given 05/26/19 2035)  metoCLOPramide (REGLAN) injection 10 mg (10 mg Intravenous Given 05/26/19 2035)  diphenhydrAMINE (BENADRYL) injection 25 mg (25 mg Intravenous Given 05/26/19 2036)     Initial Impression / Assessment and Plan / ED Course  I have reviewed the triage vital signs and the nursing notes.  Pertinent labs & imaging results that were available during my care of the patient were reviewed by me and considered in my medical decision making (see chart for details).        Labs and imaging reviewed with no new abnormal findings.  Pt has chronic cirrhosis and splenomegaly, last CT performed at [facility] showed a splenomegaly 24 cm craniocaudal, no not significantly changed.  Pt with n/v/d, probable viral gastroenteritis. She was given IV fluids here, zofran, morphine while awaiting labs and CT workup.  No emesis while here. No diarrhea either.  Complaint of persistent headache, given migraine cocktail with  resolution of sx. No neuro sx.  Discussed home care including increased fluids, rest, discussed b.r.a.t diet.  Return precautions discussed.  Pt was most relieved that she does not have covid.    Final Clinical Impressions(s) / ED Diagnoses   Final diagnoses:  Pain of upper abdomen  Nausea vomiting and diarrhea  History of splenomegaly    ED Discharge Orders    None       Landis Martins 05/26/19 2140    Evalee Jefferson, PA-C 05/26/19 2142    Julianne Rice, MD 05/26/19 2213

## 2019-05-26 NOTE — ED Triage Notes (Signed)
Pt c/o generalized abdominal pain with n/v/d since Tuesday. Pt also endorses HA, body aches, and fever. Denies COVID contacts.

## 2019-05-31 LAB — CULTURE, BLOOD (ROUTINE X 2)
Culture: NO GROWTH
Culture: NO GROWTH
Special Requests: ADEQUATE
Special Requests: ADEQUATE

## 2019-06-04 ENCOUNTER — Other Ambulatory Visit: Payer: Self-pay

## 2019-06-04 ENCOUNTER — Emergency Department (HOSPITAL_COMMUNITY)
Admission: EM | Admit: 2019-06-04 | Discharge: 2019-06-04 | Disposition: A | Discharge status: Home / Self Care | Payer: Medicaid Other | Attending: Emergency Medicine | Admitting: Emergency Medicine

## 2019-06-04 ENCOUNTER — Encounter (HOSPITAL_COMMUNITY): Payer: Self-pay | Admitting: Emergency Medicine

## 2019-06-04 DIAGNOSIS — E119 Type 2 diabetes mellitus without complications: Secondary | ICD-10-CM | POA: Diagnosis not present

## 2019-06-04 DIAGNOSIS — R51 Headache: Secondary | ICD-10-CM | POA: Diagnosis present

## 2019-06-04 DIAGNOSIS — G43009 Migraine without aura, not intractable, without status migrainosus: Secondary | ICD-10-CM | POA: Insufficient documentation

## 2019-06-04 DIAGNOSIS — Z79899 Other long term (current) drug therapy: Secondary | ICD-10-CM | POA: Diagnosis not present

## 2019-06-04 DIAGNOSIS — Z87891 Personal history of nicotine dependence: Secondary | ICD-10-CM | POA: Insufficient documentation

## 2019-06-04 DIAGNOSIS — Z794 Long term (current) use of insulin: Secondary | ICD-10-CM | POA: Diagnosis not present

## 2019-06-04 DIAGNOSIS — J449 Chronic obstructive pulmonary disease, unspecified: Secondary | ICD-10-CM | POA: Diagnosis not present

## 2019-06-04 DIAGNOSIS — D696 Thrombocytopenia, unspecified: Secondary | ICD-10-CM | POA: Insufficient documentation

## 2019-06-04 LAB — CBC WITH DIFFERENTIAL/PLATELET
Abs Immature Granulocytes: 0.02 10*3/uL (ref 0.00–0.07)
Basophils Absolute: 0 10*3/uL (ref 0.0–0.1)
Basophils Relative: 1 %
Eosinophils Absolute: 0.1 10*3/uL (ref 0.0–0.5)
Eosinophils Relative: 2 %
HCT: 40.3 % (ref 36.0–46.0)
Hemoglobin: 13 g/dL (ref 12.0–15.0)
Immature Granulocytes: 0 %
Lymphocytes Relative: 21 %
Lymphs Abs: 0.9 10*3/uL (ref 0.7–4.0)
MCH: 28 pg (ref 26.0–34.0)
MCHC: 32.3 g/dL (ref 30.0–36.0)
MCV: 86.9 fL (ref 80.0–100.0)
Monocytes Absolute: 0.4 10*3/uL (ref 0.1–1.0)
Monocytes Relative: 9 %
Neutro Abs: 3.1 10*3/uL (ref 1.7–7.7)
Neutrophils Relative %: 67 %
Platelets: 67 10*3/uL — ABNORMAL LOW (ref 150–400)
RBC: 4.64 MIL/uL (ref 3.87–5.11)
RDW: 14 % (ref 11.5–15.5)
WBC: 4.6 10*3/uL (ref 4.0–10.5)
nRBC: 0 % (ref 0.0–0.2)

## 2019-06-04 LAB — COMPREHENSIVE METABOLIC PANEL
ALT: 26 U/L (ref 0–44)
AST: 26 U/L (ref 15–41)
Albumin: 3.5 g/dL (ref 3.5–5.0)
Alkaline Phosphatase: 88 U/L (ref 38–126)
Anion gap: 9 (ref 5–15)
BUN: 6 mg/dL (ref 6–20)
CO2: 22 mmol/L (ref 22–32)
Calcium: 8.6 mg/dL — ABNORMAL LOW (ref 8.9–10.3)
Chloride: 106 mmol/L (ref 98–111)
Creatinine, Ser: 0.43 mg/dL — ABNORMAL LOW (ref 0.44–1.00)
GFR calc Af Amer: >60 mL/min (ref >60)
GFR calc non Af Amer: >60 mL/min (ref >60)
Glucose, Bld: 142 mg/dL — ABNORMAL HIGH (ref 70–99)
Potassium: 3.7 mmol/L (ref 3.5–5.1)
Sodium: 137 mmol/L (ref 135–145)
Total Bilirubin: 0.9 mg/dL (ref 0.3–1.2)
Total Protein: 6.8 g/dL (ref 6.5–8.1)

## 2019-06-04 LAB — I-STAT BETA HCG BLOOD, ED (MC, WL, AP ONLY): I-stat hCG, quantitative: <5 m[IU]/mL (ref <5)

## 2019-06-04 MED ORDER — METOCLOPRAMIDE HCL 10 MG PO TABS: 10.0000 mg | ORAL_TABLET | Freq: Four times a day (QID) | ORAL | 0 refills | Status: DC

## 2019-06-04 MED ORDER — SODIUM CHLORIDE 0.9 % IV BOLUS
1000.0000 mL | Freq: Once | INTRAVENOUS | Status: AC
  Administered 2019-06-04: 1000 mL via INTRAVENOUS

## 2019-06-04 MED ORDER — DEXAMETHASONE SODIUM PHOSPHATE 10 MG/ML IJ SOLN
10.0000 mg | Freq: Once | INTRAMUSCULAR | Status: AC
  Administered 2019-06-04: 10 mg via INTRAVENOUS
  Filled 2019-06-04: qty 1

## 2019-06-04 MED ORDER — SODIUM CHLORIDE 0.9 % IV SOLN: INTRAVENOUS | Status: DC

## 2019-06-04 MED ORDER — DIPHENHYDRAMINE HCL 50 MG/ML IJ SOLN
12.5000 mg | Freq: Once | INTRAMUSCULAR | Status: AC
  Administered 2019-06-04: 12.5 mg via INTRAVENOUS
  Filled 2019-06-04: qty 1

## 2019-06-04 MED ORDER — METOCLOPRAMIDE HCL 5 MG/ML IJ SOLN
10.0000 mg | Freq: Once | INTRAMUSCULAR | Status: AC
  Administered 2019-06-04: 07:00:00 10 mg via INTRAVENOUS
  Filled 2019-06-04: qty 2

## 2019-06-04 NOTE — ED Triage Notes (Addendum)
Pt c/o of migraine and right side pain. Hx of migraines. Not currently taking Imitrex.

## 2019-06-04 NOTE — ED Provider Notes (Signed)
[facility] EMERGENCY DEPARTMENT Provider Note   CSN: [id number] Arrival date & time: 06/04/19  0448    History   Chief Complaint Chief Complaint  Patient presents with  . Migraine    HPI Jaime [name] is a 33 y.o. female.     Pt presents to the ED today with a headache.  She has a hx of migraines and this feels similar.  She was here on 7/24 for the same.  She said she was given a migraine cocktail which relieved sx.  Headache came back last night.  The pt also has RUQ pain which is chronic.  She had a CT abd/pelvis which did not show anything acute.  She does have a hx of cirrhosis and splenomegaly which was noted.  No fevers.  She was tested for Covid-19 on 7/24 which was negative.     Past Medical History:  Diagnosis Date  . Anxiety   . Chronic abdominal pain   . Chronic back pain   . Cirrhosis ([place])   . COPD (chronic obstructive pulmonary disease) ([place])   . Depression   . Diabetes mellitus without complication ([place])   . Hepatitis C   . Insomnia   . Long-term current use of methadone for opiate dependence ([place])   . Migraine headache   . Peptic ulcer     Patient Active Problem List   Diagnosis Date Noted  . Pressure injury of skin 01/29/2019  . Uncontrolled diabetes mellitus ([place]) 09/19/2018  . Hyperglycemia 09/17/2018  . Acute respiratory failure with hypoxia ([place]) 09/15/2018  . Anxiety 09/15/2018  . Chronic abdominal pain 09/15/2018  . Diabetes mellitus without complication ([place]) 25/95/6387  . Hepatitis C 09/15/2018  . Peptic ulcer 09/15/2018  . Long-term current use of methadone for opiate dependence ([place]) 09/15/2018  . Cirrhosis ([place]) 09/15/2018  . Acute hypoxemic respiratory failure ([place]) 07/17/2016  . Pleural effusion on right 07/17/2016  . Multiple rib fractures involving four or more ribs 07/17/2016  . Hemothorax on right 07/17/2016  . Chronic pain disorder 07/17/2016    Past Surgical History:  Procedure Laterality Date  . CHOLECYSTECTOMY        OB History    Gravida      Para      Term      Preterm      AB      Living  0     SAB      TAB      Ectopic      Multiple      Live Births               Home Medications    Prior to Admission medications   Medication Sig Start Date End Date Taking? Authorizing Provider  albuterol (PROVENTIL) (2.5 MG/3ML) 0.083% nebulizer solution Take 2.5 mg by nebulization every 6 (six) hours as needed for wheezing or shortness of breath.    [provider]  etonogestrel (NEXPLANON) 68 MG IMPL implant 1 each by Subdermal route once.    [provider]  insulin NPH-regular Human (70-30) 100 UNIT/ML injection Inject 0-6 Units into the skin 3 (three) times daily. Per sliding scale  200= 2 units 250= 4 units 350= 6 units 400= Go to  ER 08/24/18   [provider]  insulin regular (NOVOLIN R,HUMULIN R) 100 units/mL injection Inject 10 Units into the skin 2 (two) times daily before a meal.     [provider]  metFORMIN (GLUCOPHAGE-XR) 500 MG 24  hr tablet Take 1,000 mg by mouth 2 (two) times daily.    [provider]  methadone (DOLOPHINE) 10 MG/5ML solution Take 10 mg by mouth every morning.    [provider]  metoCLOPramide (REGLAN) 10 MG tablet Take 1 tablet (10 mg total) by mouth every 6 (six) hours. 06/04/19   Isla Pence, MD  prochlorperazine (COMPAZINE) 10 MG tablet Take 10 mg by mouth 4 (four) times daily as needed for nausea or vomiting.  12/08/18   [provider]  promethazine (PHENERGAN) 25 MG tablet Take 25 mg by mouth daily as needed for nausea or vomiting.    [provider]  RESTASIS 0.05 % ophthalmic emulsion Place 1 drop into both eyes 2 (two) times daily.  04/22/19   [provider]  Suvorexant (BELSOMRA) 20 MG TABS Take 20 mg by mouth at bedtime.     [provider]  tetrahydrozoline 0.05 % ophthalmic solution Place 2 drops into both eyes 2 (two) times daily as needed  (allergies/dry/itchy).    [provider]  venlafaxine XR (EFFEXOR-XR) 150 MG 24 hr capsule Take 150 mg by mouth daily with breakfast.    [provider]  venlafaxine XR (EFFEXOR-XR) 37.5 MG 24 hr capsule Take 187.5 mg daily (37.5 mg + 150 mg daily) 05/15/19   Hisada, Elie Goody, MD  zolpidem (AMBIEN) 10 MG tablet Take 1 tablet by mouth at bedtime.  03/14/18   [provider]    Family History Family History  Problem Relation Age of Onset  . Hypertension Mother   . Hyperlipidemia Other   . Alcohol abuse Maternal Grandfather     Social History Social History   Tobacco Use  . Smoking status: Former Smoker    Packs/day: 0.00    Years: 5.00    Pack years: 0.00  . Smokeless tobacco: Never Used  Substance Use Topics  . Alcohol use: No  . Drug use: No     Allergies   Iodine-131, Ivp dye [iodinated diagnostic agents], Ketorolac tromethamine, Tylenol [acetaminophen], Nsaids, and Vancomycin   Review of Systems Review of Systems  Gastrointestinal: Positive for abdominal pain.  Neurological: Positive for headaches.  All other systems reviewed and are negative.    Physical Exam Updated Vital Signs BP 128/87   Pulse (!) 101   Temp 97.7 F (36.5 C) (Oral)   Resp 18   Ht 5' 10"  (1.778 m)   Wt 113.4 kg   SpO2 96%   BMI 35.87 kg/m   Physical Exam Vitals signs and nursing note reviewed.  Constitutional:      Appearance: Normal appearance.  HENT:     Head: Normocephalic and atraumatic.     Right Ear: External ear normal.     Left Ear: External ear normal.     Nose: Nose normal.     Mouth/Throat:     Mouth: Mucous membranes are moist.     Pharynx: Oropharynx is clear.  Eyes:     Extraocular Movements: Extraocular movements intact.     Conjunctiva/sclera: Conjunctivae normal.     Pupils: Pupils are equal, round, and reactive to light.  Neck:     Musculoskeletal: Normal range of motion and neck supple.  Cardiovascular:     Rate and Rhythm: Regular  rhythm. Tachycardia present.     Pulses: Normal pulses.     Heart sounds: Normal heart sounds.  Pulmonary:     Effort: Pulmonary effort is normal.     Breath sounds: Normal breath sounds.  Abdominal:     General: Abdomen is flat. Bowel sounds are normal.     Palpations: Abdomen is soft.  Musculoskeletal: Normal range of motion.  Skin:    General: Skin is warm.     Capillary Refill: Capillary refill takes less than 2 seconds.  Neurological:     General: No focal deficit present.     Mental Status: She is alert and oriented to person, place, and time.  Psychiatric:        Mood and Affect: Mood normal.        Behavior: Behavior normal.        Thought Content: Thought content normal.        Judgment: Judgment normal.      ED Treatments / Results  Labs (all labs ordered are listed, but only abnormal results are displayed) Labs Reviewed  CBC WITH DIFFERENTIAL/PLATELET - Abnormal; Notable for the following components:      Result Value   Platelets 67 (*)    All other components within normal limits  COMPREHENSIVE METABOLIC PANEL - Abnormal; Notable for the following components:   Glucose, Bld 142 (*)    Creatinine, Ser 0.43 (*)    Calcium 8.6 (*)    All other components within normal limits  I-STAT BETA HCG BLOOD, ED ([facility], [facility], [facility] ONLY)    EKG None  Radiology No results found.  Procedures Procedures (including critical care time)  Medications Ordered in ED Medications  sodium chloride 0.9 % bolus 1,000 mL (1,000 mLs Intravenous New Bag/Given 06/04/19 0729)    And  0.9 %  sodium chloride infusion (has no administration in time range)  diphenhydrAMINE (BENADRYL) injection 12.5 mg (12.5 mg Intravenous Given 06/04/19 0729)  metoCLOPramide (REGLAN) injection 10 mg (10 mg Intravenous Given 06/04/19 0729)  dexamethasone (DECADRON) injection 10 mg (10 mg Intravenous Given 06/04/19 0729)     Initial Impression / Assessment and Plan / ED Course  I have reviewed the triage vital  signs and the nursing notes.  Pertinent labs & imaging results that were available during my care of the patient were reviewed by me and considered in my medical decision making (see chart for details).       Pt is feeling much better after the above tx.  I reviewed her films and labs from last ED visit.  Pt is instructed to return if worse.  F/u with pcp.  Final Clinical Impressions(s) / ED Diagnoses   Final diagnoses:  Migraine without aura and without status migrainosus, not intractable  Thrombocytopenia [facility])    ED Discharge Orders         Ordered    metoCLOPramide (REGLAN) 10 MG tablet  Every 6 hours     06/04/19 0823           Isla Pence, MD 06/04/19 [phone]

## 2019-06-07 IMAGING — DX DG ABDOMEN ACUTE W/ 1V CHEST
5 series · 5 of 5 positions shown · non-contrast
Comparison: Chest radiograph dated 01/28/2019 and CT of the abdomen
pelvis dated 09/23/2018.

CLINICAL DATA: 33-year-old female with vomiting and abdominal pain.

EXAM:
DG ABDOMEN ACUTE W/ 1V CHEST

[chest pa]
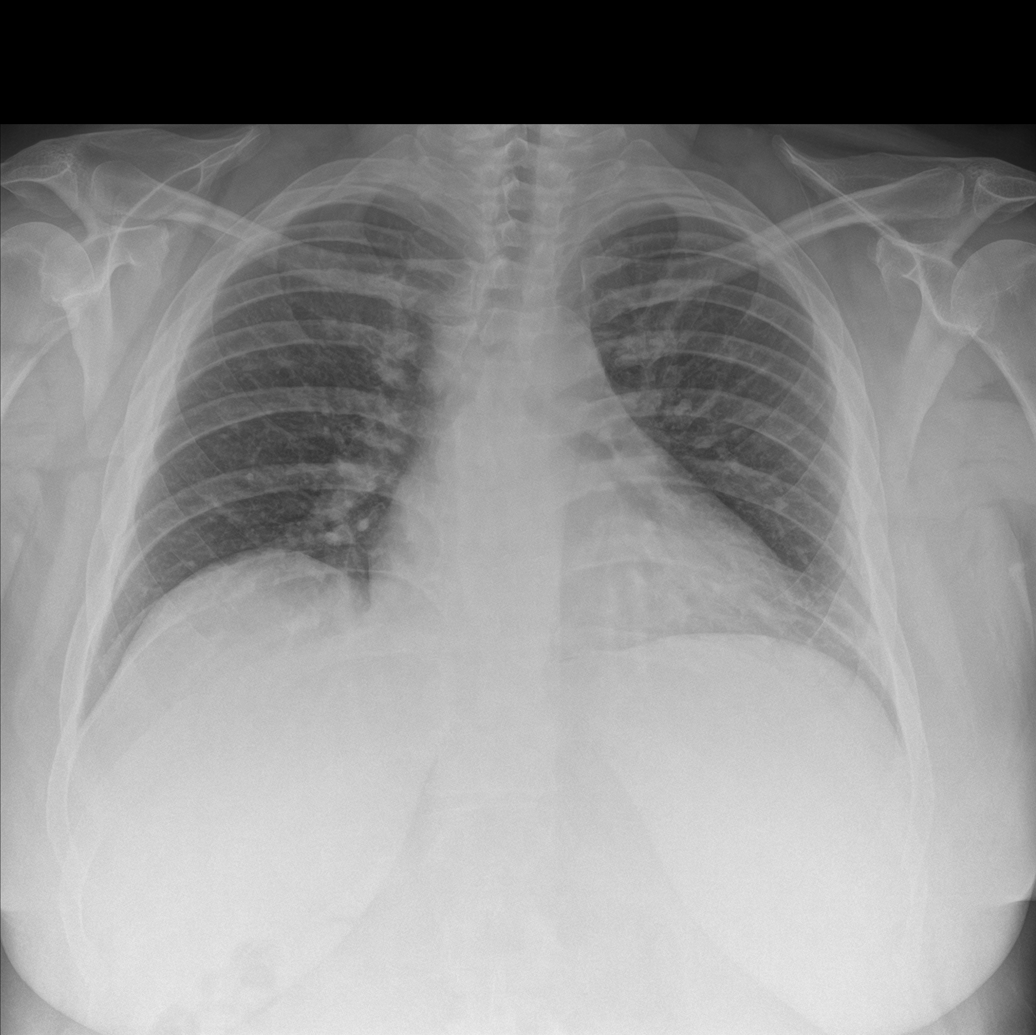

[abdomen erect]
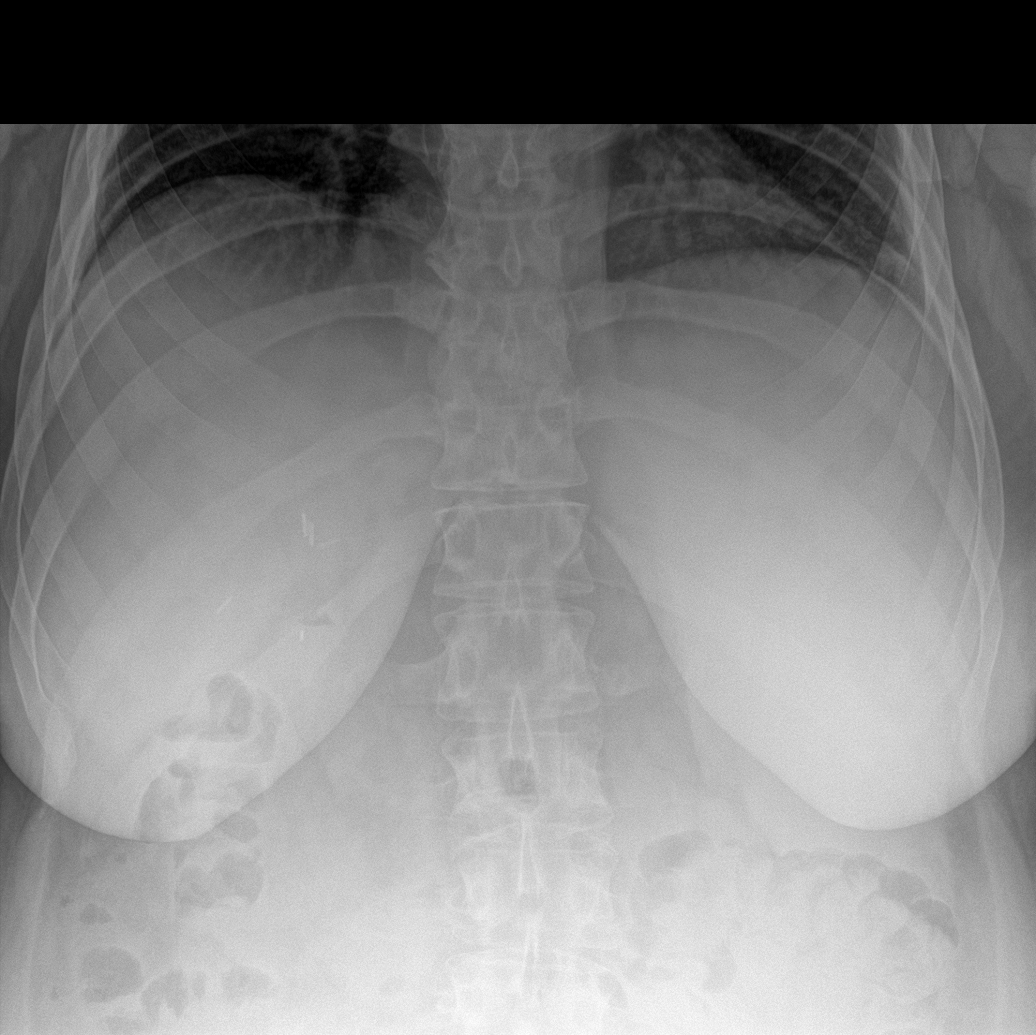

[abdomen supine (1 of 3)]
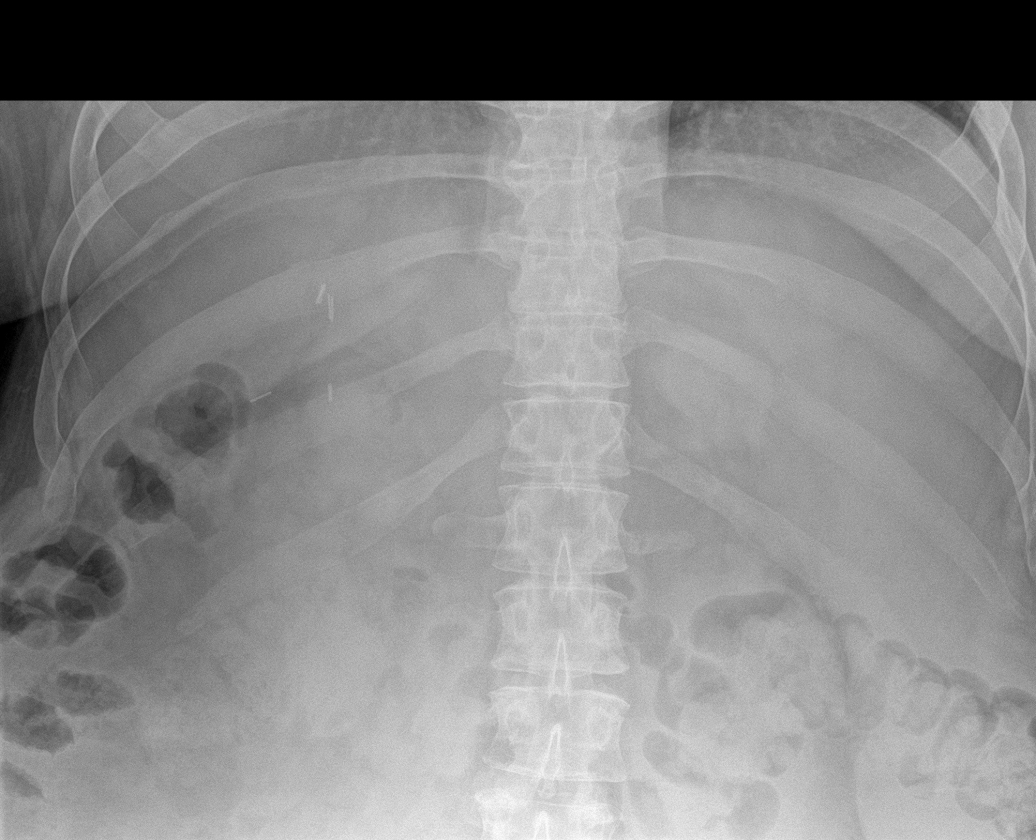

[abdomen supine (2 of 3)]
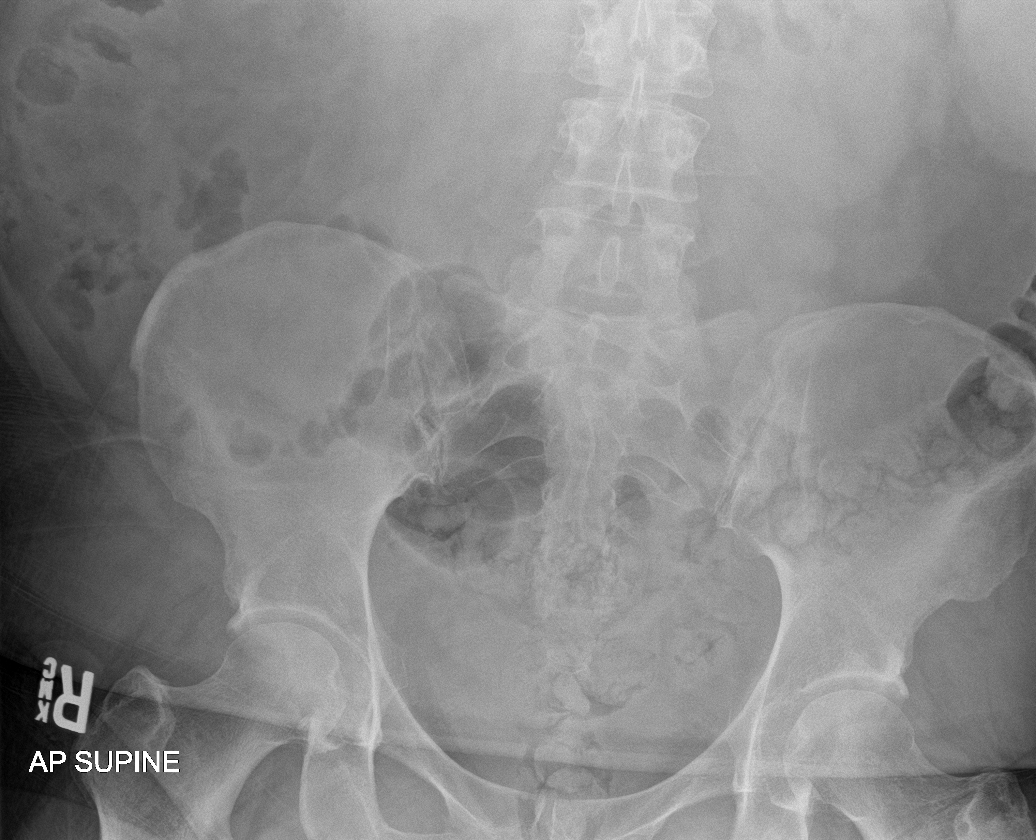

[abdomen supine (3 of 3)]
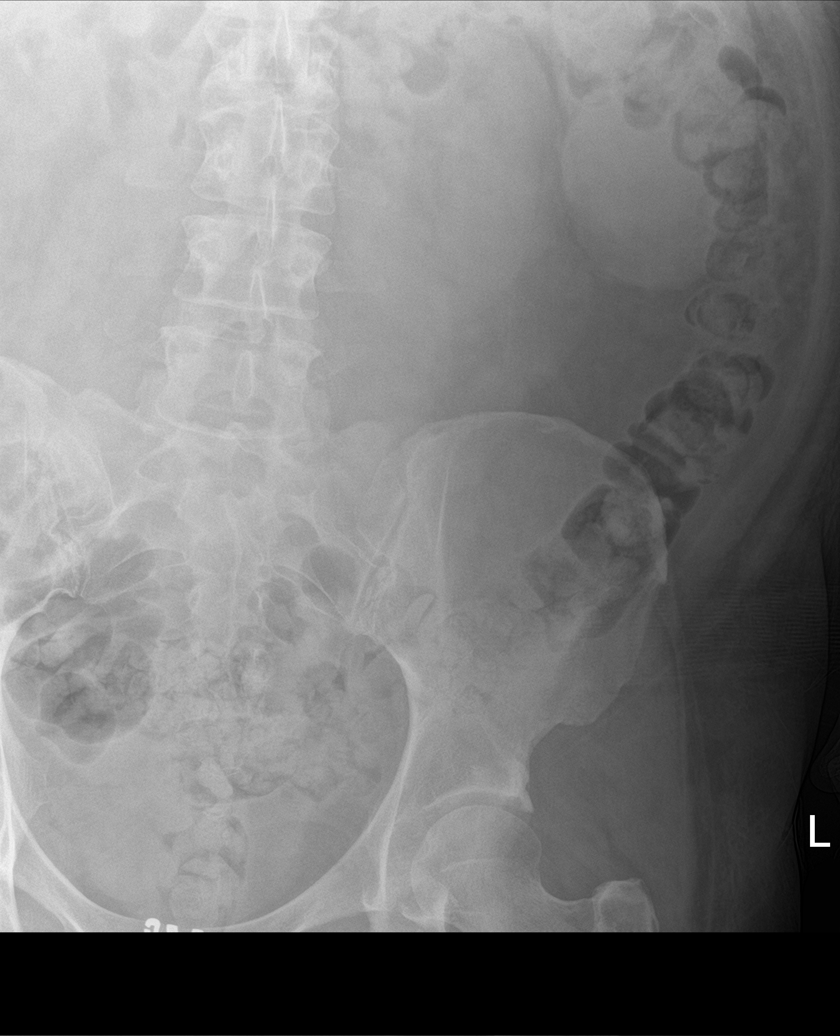

[5 of 5 positions shown; findings below may reference images not displayed]

FINDINGS: Evaluation is limited due to body habitus.

There is no focal consolidation, pleural effusion, or pneumothorax.
The cardiac silhouette is within normal limits.

There is large amount of stool throughout the colon. No bowel
dilatation or evidence of obstruction. No free air or radiopaque
calculi. There is splenomegaly. Right upper quadrant cholecystectomy
clips. No acute osseous pathology.
IMPRESSION: Constipation.  No bowel obstruction.
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
## 2019-06-15 ENCOUNTER — Other Ambulatory Visit: Payer: Self-pay

## 2019-06-15 ENCOUNTER — Telehealth (HOSPITAL_COMMUNITY): Payer: Self-pay | Admitting: Psychiatry

## 2019-06-15 ENCOUNTER — Ambulatory Visit (HOSPITAL_COMMUNITY): Payer: Medicaid Other | Admitting: Psychiatry

## 2019-06-15 NOTE — Telephone Encounter (Signed)
Sent link for video visit through [facility]. Patient did not sign in. Called the patient  twice for appointment scheduled today. The patient did not answer the phone. Left voice message to contact the office.  

## 2019-06-16 ENCOUNTER — Telehealth (HOSPITAL_COMMUNITY): Payer: Self-pay | Admitting: *Deleted

## 2019-06-16 ENCOUNTER — Other Ambulatory Visit (HOSPITAL_COMMUNITY): Payer: Self-pay | Admitting: Psychiatry

## 2019-06-16 MED ORDER — VENLAFAXINE HCL ER 37.5 MG PO CP24: ORAL_CAPSULE | ORAL | 0 refills | Status: DC

## 2019-06-16 NOTE — Telephone Encounter (Signed)
Ordered venlafaxine

## 2019-06-16 NOTE — Telephone Encounter (Signed)
FRONT OFFICE HAS SCHEDULE PATIENT FOR  OFFICE VISIT ON 07/27/2019 & PATIENT REQUESTED MED REFILL

## 2019-06-21 ENCOUNTER — Emergency Department (HOSPITAL_COMMUNITY): Payer: Medicaid Other

## 2019-06-21 ENCOUNTER — Encounter (HOSPITAL_COMMUNITY): Payer: Self-pay | Admitting: Emergency Medicine

## 2019-06-21 ENCOUNTER — Other Ambulatory Visit: Payer: Self-pay

## 2019-06-21 ENCOUNTER — Emergency Department (HOSPITAL_COMMUNITY)
Admission: EM | Admit: 2019-06-21 | Discharge: 2019-06-21 | Disposition: A | Discharge status: Home / Self Care | Payer: Medicaid Other | Attending: Emergency Medicine | Admitting: Emergency Medicine

## 2019-06-21 DIAGNOSIS — Z5321 Procedure and treatment not carried out due to patient leaving prior to being seen by health care provider: Secondary | ICD-10-CM | POA: Insufficient documentation

## 2019-06-21 DIAGNOSIS — R0789 Other chest pain: Secondary | ICD-10-CM | POA: Insufficient documentation

## 2019-06-21 MED ORDER — SODIUM CHLORIDE 0.9% FLUSH: 3.0000 mL | Freq: Once | INTRAVENOUS | Status: DC

## 2019-06-21 NOTE — ED Triage Notes (Signed)
Patient c/o constant chest pain since yesterday. C/O bilat LE edema, has increased diuretics without relief.

## 2019-06-21 NOTE — ED Notes (Signed)
Received a note from registration that patient LWBS after Triage, stated she would come back later.

## 2019-06-28 ENCOUNTER — Other Ambulatory Visit (HOSPITAL_COMMUNITY): Payer: Self-pay | Admitting: Pulmonary Disease

## 2019-06-28 DIAGNOSIS — R609 Edema, unspecified: Secondary | ICD-10-CM

## 2019-07-05 ENCOUNTER — Other Ambulatory Visit: Payer: Self-pay

## 2019-07-05 ENCOUNTER — Ambulatory Visit (HOSPITAL_COMMUNITY)
Admission: RE | Admit: 2019-07-05 | Discharge: 2019-07-05 | Disposition: A | Discharge status: Home / Self Care | Payer: Medicaid Other | Source: Ambulatory Visit | Attending: Pulmonary Disease | Admitting: Pulmonary Disease

## 2019-07-05 DIAGNOSIS — R609 Edema, unspecified: Secondary | ICD-10-CM | POA: Diagnosis not present

## 2019-07-05 NOTE — Progress Notes (Signed)
*  PRELIMINARY RESULTS* Echocardiogram 2D Echocardiogram has been performed.  Jaime [name] 07/05/2019, 11:28 AM
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
## 2019-07-25 ENCOUNTER — Ambulatory Visit (INDEPENDENT_AMBULATORY_CARE_PROVIDER_SITE_OTHER): Payer: Medicaid Other | Admitting: Psychiatry

## 2019-07-25 ENCOUNTER — Encounter (HOSPITAL_COMMUNITY): Payer: Self-pay | Admitting: Psychiatry

## 2019-07-25 ENCOUNTER — Other Ambulatory Visit: Payer: Self-pay

## 2019-07-25 DIAGNOSIS — G4700 Insomnia, unspecified: Secondary | ICD-10-CM | POA: Diagnosis not present

## 2019-07-25 DIAGNOSIS — F331 Major depressive disorder, recurrent, moderate: Secondary | ICD-10-CM | POA: Diagnosis not present

## 2019-07-25 DIAGNOSIS — F1121 Opioid dependence, in remission: Secondary | ICD-10-CM

## 2019-07-25 MED ORDER — CLONAZEPAM 1 MG PO TBDP: 1.0000 mg | ORAL_TABLET | Freq: Two times a day (BID) | ORAL | 0 refills | Status: DC | PRN

## 2019-07-25 MED ORDER — VENLAFAXINE HCL ER 150 MG PO CP24: ORAL_CAPSULE | ORAL | 1 refills | Status: DC

## 2019-07-25 MED ORDER — VENLAFAXINE HCL ER 75 MG PO CP24: ORAL_CAPSULE | ORAL | 1 refills | Status: DC

## 2019-07-25 NOTE — Patient Instructions (Signed)
1. Increase venlafaxine 225 mg daily (150 mg + 75 mg) 2. Decrease clonazepam 1 mg twice a day as needed for anxiety  3. Next appointment: 10/28 at 10 AM  4. Referral to therapy  5. Referral to evaluation for sleep apnea

## 2019-08-03 ENCOUNTER — Telehealth: Payer: Self-pay

## 2019-08-03 ENCOUNTER — Institutional Professional Consult (permissible substitution): Payer: Medicaid Other | Admitting: Neurology

## 2019-08-03 NOTE — Telephone Encounter (Signed)
Pt did not show for their appt with Dr. Athar today.  

## 2019-08-08 ENCOUNTER — Encounter: Payer: Self-pay | Admitting: Neurology

## 2019-08-08 ENCOUNTER — Other Ambulatory Visit (HOSPITAL_COMMUNITY): Payer: Self-pay | Admitting: Psychiatry

## 2019-08-08 MED ORDER — CLONAZEPAM 1 MG PO TABS: 1.0000 mg | ORAL_TABLET | Freq: Two times a day (BID) | ORAL | 0 refills | Status: DC

## 2019-08-10 ENCOUNTER — Other Ambulatory Visit (HOSPITAL_COMMUNITY): Payer: Self-pay | Admitting: Psychiatry

## 2019-08-10 ENCOUNTER — Telehealth (HOSPITAL_COMMUNITY): Payer: Self-pay | Admitting: *Deleted

## 2019-08-10 NOTE — Telephone Encounter (Signed)
RX REQUIRED A SCRIPT

## 2019-08-10 NOTE — Telephone Encounter (Signed)
PATIENT CALLED STATING SHE LOST HER VENLAFAXINE  75 MG & 150 MG & THAT Rx TOLD HER SHE WOULD HAVE TO PAY OUT OF POCKET IF PROVIDER OK 'd  BY SENDING IN NEW SCRIPTS. VERIFIED WITH Rx THAT NEW SCRIPTS WOULD BE NEED & THE COST TO PATIENT IS $30.00

## 2019-08-10 NOTE — Telephone Encounter (Signed)
I believe both medication was ordered with a refill. It ok to fill these medication.

## 2019-08-10 NOTE — Telephone Encounter (Signed)
It was ordered 9/22 with one refill. There should be one refill left. Please talk with pharmacy.

## 2019-08-11 NOTE — Telephone Encounter (Signed)
SPOKE AGAIN WITH RX GAVE VERBAL ORDER REFILL
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
## 2019-08-24 ENCOUNTER — Emergency Department (HOSPITAL_COMMUNITY): Payer: Medicaid Other

## 2019-08-24 ENCOUNTER — Other Ambulatory Visit: Payer: Self-pay

## 2019-08-24 ENCOUNTER — Encounter (HOSPITAL_COMMUNITY): Payer: Self-pay | Admitting: Emergency Medicine

## 2019-08-24 ENCOUNTER — Emergency Department (HOSPITAL_COMMUNITY)
Admission: EM | Admit: 2019-08-24 | Discharge: 2019-08-24 | Disposition: A | Discharge status: Home / Self Care | Payer: Medicaid Other | Attending: Emergency Medicine | Admitting: Emergency Medicine

## 2019-08-24 DIAGNOSIS — R1031 Right lower quadrant pain: Secondary | ICD-10-CM | POA: Insufficient documentation

## 2019-08-24 DIAGNOSIS — Z79899 Other long term (current) drug therapy: Secondary | ICD-10-CM | POA: Diagnosis not present

## 2019-08-24 DIAGNOSIS — R519 Headache, unspecified: Secondary | ICD-10-CM | POA: Diagnosis present

## 2019-08-24 DIAGNOSIS — G43009 Migraine without aura, not intractable, without status migrainosus: Secondary | ICD-10-CM | POA: Diagnosis not present

## 2019-08-24 DIAGNOSIS — Z87891 Personal history of nicotine dependence: Secondary | ICD-10-CM | POA: Diagnosis not present

## 2019-08-24 DIAGNOSIS — Z793 Long term (current) use of hormonal contraceptives: Secondary | ICD-10-CM | POA: Insufficient documentation

## 2019-08-24 DIAGNOSIS — E119 Type 2 diabetes mellitus without complications: Secondary | ICD-10-CM | POA: Diagnosis not present

## 2019-08-24 DIAGNOSIS — Z794 Long term (current) use of insulin: Secondary | ICD-10-CM | POA: Insufficient documentation

## 2019-08-24 DIAGNOSIS — J449 Chronic obstructive pulmonary disease, unspecified: Secondary | ICD-10-CM | POA: Diagnosis not present

## 2019-08-24 LAB — URINALYSIS, ROUTINE W REFLEX MICROSCOPIC
Bilirubin Urine: NEGATIVE
Glucose, UA: NEGATIVE mg/dL
Hgb urine dipstick: NEGATIVE
Ketones, ur: NEGATIVE mg/dL
Leukocytes,Ua: NEGATIVE
Nitrite: NEGATIVE
Protein, ur: NEGATIVE mg/dL
Specific Gravity, Urine: 1.018 (ref 1.005–1.030)
pH: 6 (ref 5.0–8.0)

## 2019-08-24 LAB — I-STAT BETA HCG BLOOD, ED (MC, WL, AP ONLY): I-stat hCG, quantitative: <5 m[IU]/mL (ref <5)

## 2019-08-24 LAB — CBG MONITORING, ED: Glucose-Capillary: 130 mg/dL — ABNORMAL HIGH (ref 70–99)

## 2019-08-24 MED ORDER — SODIUM CHLORIDE 0.9 % IV BOLUS
1000.0000 mL | Freq: Once | INTRAVENOUS | Status: AC
  Administered 2019-08-24: 1000 mL via INTRAVENOUS

## 2019-08-24 MED ORDER — DIPHENHYDRAMINE HCL 50 MG/ML IJ SOLN
25.0000 mg | Freq: Once | INTRAMUSCULAR | Status: AC
  Administered 2019-08-24: 25 mg via INTRAVENOUS
  Filled 2019-08-24: qty 1

## 2019-08-24 MED ORDER — DIPHENHYDRAMINE HCL 50 MG/ML IJ SOLN
25.0000 mg | Freq: Once | INTRAMUSCULAR | Status: AC
  Administered 2019-08-24: 08:00:00 25 mg via INTRAVENOUS
  Filled 2019-08-24: qty 1

## 2019-08-24 MED ORDER — METOCLOPRAMIDE HCL 5 MG/ML IJ SOLN
10.0000 mg | Freq: Once | INTRAMUSCULAR | Status: AC
  Administered 2019-08-24: 08:00:00 10 mg via INTRAVENOUS
  Filled 2019-08-24: qty 2

## 2019-08-24 NOTE — ED Provider Notes (Signed)
Patient CARE signed out this morning to follow-up imaging testing.  Patient had gradual onset headache with nausea different than previous.  No focal neuro deficits on exam.  Patient had CT scan of the head ordered to follow-up result.  IV being placed in medications for supportive care.  Patient's had right-sided abdominal pain for weeks without fever or vomiting.  CT scan ordered to look for any further cause of her pain by previous physician. On exam no significant abdominal tenderness minimal right mid abdomen.  Delay of CT scans.  CT scan results reviewed no acute findings overall similar to previous.  Appendix specifically normal and no bleeding intracranially.  Golda Acre, MD 08/24/19 1134

## 2019-08-24 NOTE — ED Triage Notes (Signed)
Pt c/o intermittent headaches x 2 days.

## 2019-08-24 NOTE — ED Provider Notes (Signed)
[facility] EMERGENCY DEPARTMENT Provider Note   CSN: [id number] Arrival date & time: 08/24/19  6789   Time seen 5:20 AM  History   Chief Complaint Chief Complaint  Patient presents with  . Headache    HPI Jaime [name] is a 33 y.o. female.     HPI patient states on October 18 she started getting a holocranial throbbing headache with nausea but no vomiting.  She states her vision is blurred.  She denies any new numbness or tingling of her extremities.  She describes photophobia and noise sensitivity.  She denies being under any extra stress.  She denies fever.  She states she did have a cough started yesterday but denies sore throat.  She denies any recent head trauma or injury.  She states she is never had a headache like this before.  She denies any family history of headaches.  Patient is diabetic and states her CBGs have been okay running around 150.  At the very end of our interview she told me she was having right lower quadrant pain that was sharp, nothing makes it hurt more.  When I review her chart she has a history of chronic abdominal pain.  PCP Sinda Du, MD   Past Medical History:  Diagnosis Date  . Anxiety   . Chronic abdominal pain   . Chronic back pain   . Cirrhosis ([place])   . COPD (chronic obstructive pulmonary disease) ([place])   . Depression   . Diabetes mellitus without complication ([place])   . Hepatitis C   . Insomnia   . Long-term current use of methadone for opiate dependence ([place])   . Migraine headache   . Peptic ulcer     Patient Active Problem List   Diagnosis Date Noted  . Pressure injury of skin 01/29/2019  . Uncontrolled diabetes mellitus ([place]) 09/19/2018  . Hyperglycemia 09/17/2018  . Acute respiratory failure with hypoxia ([place]) 09/15/2018  . Anxiety 09/15/2018  . Chronic abdominal pain 09/15/2018  . Diabetes mellitus without complication ([place]) 38/08/1750  . Hepatitis C 09/15/2018  . Peptic ulcer 09/15/2018  . Long-term current use  of methadone for opiate dependence ([place]) 09/15/2018  . Cirrhosis ([place]) 09/15/2018  . Acute hypoxemic respiratory failure ([place]) 07/17/2016  . Pleural effusion on right 07/17/2016  . Multiple rib fractures involving four or more ribs 07/17/2016  . Hemothorax on right 07/17/2016  . Chronic pain disorder 07/17/2016    Past Surgical History:  Procedure Laterality Date  . CHOLECYSTECTOMY       OB History    Gravida      Para      Term      Preterm      AB      Living  0     SAB      TAB      Ectopic      Multiple      Live Births               Home Medications    Prior to Admission medications   Medication Sig Start Date End Date Taking? Authorizing Provider  albuterol (PROVENTIL) (2.5 MG/3ML) 0.083% nebulizer solution Take 2.5 mg by nebulization every 6 (six) hours as needed for wheezing or shortness of breath.    [provider]  clonazePAM (KLONOPIN) 1 MG tablet Take 1 tablet (1 mg total) by mouth 2 (two) times daily. 08/08/19 09/07/19  Norman Clay, MD  etonogestrel (NEXPLANON) 68 MG IMPL implant 1 each  by Subdermal route once.    [provider]  insulin NPH-regular Human (70-30) 100 UNIT/ML injection Inject 0-6 Units into the skin 3 (three) times daily. Per sliding scale  200= 2 units 250= 4 units 350= 6 units 400= Go to  ER 08/24/18   [provider]  insulin regular (NOVOLIN R,HUMULIN R) 100 units/mL injection Inject 10 Units into the skin 2 (two) times daily before a meal.     [provider]  metFORMIN (GLUCOPHAGE-XR) 500 MG 24 hr tablet Take 1,000 mg by mouth 2 (two) times daily.    [provider]  methadone (DOLOPHINE) 10 MG/5ML solution Take 30 mg by mouth every morning.     [provider]  metoCLOPramide (REGLAN) 10 MG tablet Take 1 tablet (10 mg total) by mouth every 6 (six) hours. 06/04/19   Isla Pence, MD  prochlorperazine (COMPAZINE) 10 MG tablet Take 10 mg by mouth 4 (four) times daily  as needed for nausea or vomiting.  12/08/18   [provider]  promethazine (PHENERGAN) 25 MG tablet Take 25 mg by mouth daily as needed for nausea or vomiting.    [provider]  RESTASIS 0.05 % ophthalmic emulsion Place 1 drop into both eyes 2 (two) times daily.  04/22/19   [provider]  Suvorexant (BELSOMRA) 20 MG TABS Take 20 mg by mouth at bedtime.     [provider]  tetrahydrozoline 0.05 % ophthalmic solution Place 2 drops into both eyes 2 (two) times daily as needed (allergies/dry/itchy).    [provider]  venlafaxine XR (EFFEXOR-XR) 150 MG 24 hr capsule Take total of 225 mg daily (150 mg + 75 mg ) 07/25/19   Norman Clay, MD  venlafaxine XR (EFFEXOR-XR) 37.5 MG 24 hr capsule Take 187.5 mg daily (37.5 mg + 150 mg daily) 06/16/19   Norman Clay, MD  venlafaxine XR (EFFEXOR-XR) 75 MG 24 hr capsule Take total of 225 mg daily (150 mg + 75 mg ) 07/25/19   Hisada, Elie Goody, MD  zolpidem (AMBIEN) 10 MG tablet Take 1 tablet by mouth at bedtime.  03/14/18   [provider]    Family History Family History  Problem Relation Age of Onset  . Hypertension Mother   . Hyperlipidemia Other   . Alcohol abuse Maternal Grandfather     Social History Social History   Tobacco Use  . Smoking status: Former Smoker    Packs/day: 0.00    Years: 5.00    Pack years: 0.00  . Smokeless tobacco: Never Used  Substance Use Topics  . Alcohol use: No  . Drug use: No  on disability for depression and anxiety   Allergies   Iodine-131, Ivp dye [iodinated diagnostic agents], Ketorolac tromethamine, Tylenol [acetaminophen], Nsaids, and Vancomycin   Review of Systems Review of Systems  All other systems reviewed and are negative.    Physical Exam Updated Vital Signs BP (!) 143/93 (BP Location: Right Arm)   Pulse 92   Temp 97.9 F (36.6 C) (Oral)   Resp 18   Ht 5' 9"  (1.753 m)   Wt 113.4 kg   SpO2 95%   BMI 36.92 kg/m   Vital signs normal     Physical Exam Vitals signs and nursing note reviewed.  Constitutional:      Appearance: She is well-developed. She is obese.     Comments: Speaks slowly  HENT:     Head: Normocephalic and atraumatic.     Right Ear: External ear  normal.     Left Ear: External ear normal.     Mouth/Throat:     Mouth: Mucous membranes are dry.     Pharynx: No oropharyngeal exudate or posterior oropharyngeal erythema.  Eyes:     Extraocular Movements: Extraocular movements intact.     Conjunctiva/sclera: Conjunctivae normal.     Pupils: Pupils are equal, round, and reactive to light.  Neck:     Musculoskeletal: Normal range of motion.  Cardiovascular:     Rate and Rhythm: Normal rate and regular rhythm.  Pulmonary:     Effort: Pulmonary effort is normal. No respiratory distress.  Abdominal:     General: Abdomen is flat. Bowel sounds are normal. There is no distension.     Tenderness: There is abdominal tenderness. There is no guarding or rebound.    Musculoskeletal: Normal range of motion.        General: No swelling or deformity.  Skin:    General: Skin is warm and dry.     Findings: No erythema or rash.  Neurological:     General: No focal deficit present.     Mental Status: She is alert and oriented to person, place, and time.  Psychiatric:        Mood and Affect: Mood normal.        Behavior: Behavior normal.        Thought Content: Thought content normal.      ED Treatments / Results  Labs (all labs ordered are listed, but only abnormal results are displayed)  CBG and beta hCG i-STAT ordered   EKG None  Radiology No results found.   CT head and CT abdomen/pelvis pending  Procedures Procedures (including critical care time)  Medications Ordered in ED Medications  sodium chloride 0.9 % bolus 1,000 mL (has no administration in time range)  metoCLOPramide (REGLAN) injection 10 mg (has no administration in time range)  diphenhydrAMINE (BENADRYL) injection 25 mg (has  no administration in time range)     Initial Impression / Assessment and Plan / ED Course  I have reviewed the triage vital signs and the nursing notes.  Pertinent labs & imaging results that were available during my care of the patient were reviewed by me and considered in my medical decision making (see chart for details).    Patient's headache sounds like a migraine type headache however she states she is never had it before.  Therefore head CT was done.  Patient has a history of "chronic abdominal pain".  However when I look her pain is been in different areas than she describes tonight.  Abdominal CT without contrast was done because of dye allergy.  Recheck at 6:30 AM unfortunately due to an emergency in the emergency department patient's therapy had not been started yet.  This was explained to the patient.   Pt was left at change of shift with Dr Reather Converse to get her CT results and see how she does with the migraine cocktail.   Final Clinical Impressions(s) / ED Diagnoses   Final diagnoses:  Migraine without aura and without status migrainosus, not intractable  RLQ abdominal pain    Disposition pending  Rolland Porter, MD, Barbette Or, MD 08/24/19 [phone]

## 2019-08-24 NOTE — Discharge Instructions (Addendum)
Follow-up with your primary doctor for reassessment in the next week. See a clinician for new or worsening symptoms.

## 2019-08-25 ENCOUNTER — Ambulatory Visit (HOSPITAL_COMMUNITY): Payer: Medicaid Other | Admitting: Licensed Clinical Social Worker

## 2019-08-30 ENCOUNTER — Other Ambulatory Visit: Payer: Self-pay

## 2019-08-30 ENCOUNTER — Ambulatory Visit (INDEPENDENT_AMBULATORY_CARE_PROVIDER_SITE_OTHER): Payer: Medicaid Other | Admitting: Psychiatry

## 2019-08-30 ENCOUNTER — Encounter (HOSPITAL_COMMUNITY): Payer: Self-pay | Admitting: Psychiatry

## 2019-08-30 DIAGNOSIS — F330 Major depressive disorder, recurrent, mild: Secondary | ICD-10-CM | POA: Diagnosis not present

## 2019-08-30 MED ORDER — VENLAFAXINE HCL ER 150 MG PO CP24: ORAL_CAPSULE | ORAL | 0 refills | Status: DC

## 2019-08-30 MED ORDER — VENLAFAXINE HCL ER 75 MG PO CP24: ORAL_CAPSULE | ORAL | 1 refills | Status: DC

## 2019-08-30 MED ORDER — CLONAZEPAM 1 MG PO TABS: 1.0000 mg | ORAL_TABLET | Freq: Two times a day (BID) | ORAL | 2 refills | Status: DC

## 2019-08-30 NOTE — Patient Instructions (Signed)
1. Continue venlafaxine 225 mg daily  2. Continue clonazepam 1 mg twice a day as needed for anxiety   3. Next appointment: in January 4. Referral to therapy

## 2019-10-27 ENCOUNTER — Encounter (HOSPITAL_COMMUNITY): Payer: Self-pay | Admitting: *Deleted

## 2019-10-27 ENCOUNTER — Other Ambulatory Visit: Payer: Self-pay

## 2019-10-27 ENCOUNTER — Emergency Department (HOSPITAL_COMMUNITY)
Admission: EM | Admit: 2019-10-27 | Discharge: 2019-10-27 | Disposition: A | Discharge status: Home / Self Care | Payer: Medicaid Other | Attending: Emergency Medicine | Admitting: Emergency Medicine

## 2019-10-27 DIAGNOSIS — M791 Myalgia, unspecified site: Secondary | ICD-10-CM | POA: Insufficient documentation

## 2019-10-27 DIAGNOSIS — Z5321 Procedure and treatment not carried out due to patient leaving prior to being seen by health care provider: Secondary | ICD-10-CM | POA: Insufficient documentation

## 2019-10-27 LAB — CBG MONITORING, ED: Glucose-Capillary: 376 mg/dL — ABNORMAL HIGH (ref 70–99)

## 2019-10-27 NOTE — ED Triage Notes (Signed)
Patient presents to the ED with generalized body aches, dry cough, with nausea for one day.  Patient does not report a fever.

## 2019-11-14 ENCOUNTER — Other Ambulatory Visit: Payer: Self-pay

## 2019-11-14 ENCOUNTER — Ambulatory Visit
Admission: EM | Admit: 2019-11-14 | Discharge: 2019-11-14 | Disposition: A | Discharge status: Home / Self Care | Payer: Medicaid Other | Attending: Emergency Medicine | Admitting: Emergency Medicine

## 2019-11-14 DIAGNOSIS — Z20822 Contact with and (suspected) exposure to covid-19: Secondary | ICD-10-CM | POA: Diagnosis not present

## 2019-11-14 MED ORDER — BENZONATATE 100 MG PO CAPS: 100.0000 mg | ORAL_CAPSULE | Freq: Three times a day (TID) | ORAL | 0 refills | Status: DC

## 2019-11-14 MED ORDER — ONDANSETRON HCL 4 MG PO TABS: 4.0000 mg | ORAL_TABLET | Freq: Three times a day (TID) | ORAL | 0 refills | Status: DC | PRN

## 2019-11-14 MED ORDER — FLUTICASONE PROPIONATE 50 MCG/ACT NA SUSP: 1.0000 | Freq: Every day | NASAL | 0 refills | Status: DC

## 2019-11-14 NOTE — ED Provider Notes (Signed)
[facility]    CSN: [id number] Arrival date & time: 11/14/19  1555      History   Chief Complaint Chief Complaint  Patient presents with  . Cough  . Nausea    HPI Jaime [name] is a 34 y.o. female.   Jaime [name] 34 years old female presented to the urgent care with a complaint of Cough, Headache, Nausea for the past 3 days.  Denies sick exposure to COVID, flu or strep.  Denies recent travel.  Denies aggravating or alleviating symptoms.  Denies previous COVID infection.   Denies fever, chills, fatigue, nasal congestion, rhinorrhea, sore throat,  SOB, wheezing, chest pain,  vomiting, changes in bowel or bladder habits.    The history is provided by the patient. No language interpreter was used.  Cough Associated symptoms: headaches     Past Medical History:  Diagnosis Date  . Anxiety   . Chronic abdominal pain   . Chronic back pain   . Cirrhosis ([place])   . COPD (chronic obstructive pulmonary disease) ([place])   . Depression   . Diabetes mellitus without complication ([place])   . Hepatitis C   . Insomnia   . Long-term current use of methadone for opiate dependence ([place])   . Migraine headache   . Peptic ulcer     Patient Active Problem List   Diagnosis Date Noted  . Pressure injury of skin 01/29/2019  . Uncontrolled diabetes mellitus ([place]) 09/19/2018  . Hyperglycemia 09/17/2018  . Acute respiratory failure with hypoxia ([place]) 09/15/2018  . Anxiety 09/15/2018  . Chronic abdominal pain 09/15/2018  . Diabetes mellitus without complication ([place]) 50/93/2671  . Hepatitis C 09/15/2018  . Peptic ulcer 09/15/2018  . Long-term current use of methadone for opiate dependence ([place]) 09/15/2018  . Cirrhosis ([place]) 09/15/2018  . Acute hypoxemic respiratory failure ([place]) 07/17/2016  . Pleural effusion on right 07/17/2016  . Multiple rib fractures involving four or more ribs 07/17/2016  . Hemothorax on right 07/17/2016  . Chronic pain disorder 07/17/2016    Past  Surgical History:  Procedure Laterality Date  . CHOLECYSTECTOMY      OB History    Gravida      Para      Term      Preterm      AB      Living  0     SAB      TAB      Ectopic      Multiple      Live Births               Home Medications    Prior to Admission medications   Medication Sig Start Date End Date Taking? Authorizing Provider  albuterol (PROVENTIL) (2.5 MG/3ML) 0.083% nebulizer solution Take 2.5 mg by nebulization every 6 (six) hours as needed for wheezing or shortness of breath.    [provider]  clonazePAM (KLONOPIN) 1 MG tablet Take 1 tablet (1 mg total) by mouth 2 (two) times daily. 09/07/19 12/06/19  Norman Clay, MD  etonogestrel (NEXPLANON) 68 MG IMPL implant 1 each by Subdermal route once.    [provider]  insulin NPH-regular Human (70-30) 100 UNIT/ML injection Inject 0-6 Units into the skin 3 (three) times daily. Per sliding scale  200= 2 units 250= 4 units 350= 6 units 400= Go to  ER 08/24/18   [provider]  insulin regular (NOVOLIN R,HUMULIN R) 100 units/mL injection Inject 10 Units into the skin 2 (  two) times daily before a meal.     [provider]  metFORMIN (GLUCOPHAGE-XR) 500 MG 24 hr tablet Take 1,000 mg by mouth 2 (two) times daily.    [provider]  methadone (DOLOPHINE) 10 MG/5ML solution Take 50 mg by mouth every morning.     [provider]  metoCLOPramide (REGLAN) 10 MG tablet Take 1 tablet (10 mg total) by mouth every 6 (six) hours. 06/04/19   Isla Pence, MD  prochlorperazine (COMPAZINE) 10 MG tablet Take 10 mg by mouth 4 (four) times daily as needed for nausea or vomiting.  12/08/18   [provider]  promethazine (PHENERGAN) 25 MG tablet Take 25 mg by mouth daily as needed for nausea or vomiting.    [provider]  RESTASIS 0.05 % ophthalmic emulsion Place 1 drop into both eyes 2 (two) times daily.  04/22/19   [provider]  Suvorexant  (BELSOMRA) 20 MG TABS Take 20 mg by mouth at bedtime.     [provider]  tetrahydrozoline 0.05 % ophthalmic solution Place 2 drops into both eyes 2 (two) times daily as needed (allergies/dry/itchy).    [provider]  venlafaxine XR (EFFEXOR-XR) 150 MG 24 hr capsule Take total of 225 mg daily (150 mg + 75 mg ) 11/08/19   Norman Clay, MD  venlafaxine XR (EFFEXOR-XR) 75 MG 24 hr capsule Take total of 225 mg daily (150 mg + 75 mg ) 08/30/19   Hisada, Elie Goody, MD  zolpidem (AMBIEN) 10 MG tablet Take 1 tablet by mouth at bedtime.  03/14/18   [provider]    Family History Family History  Problem Relation Age of Onset  . Hypertension Mother   . Hyperlipidemia Other   . Alcohol abuse Maternal Grandfather     Social History Social History   Tobacco Use  . Smoking status: Former Smoker    Packs/day: 0.00    Years: 5.00    Pack years: 0.00  . Smokeless tobacco: Never Used  Substance Use Topics  . Alcohol use: No  . Drug use: No     Allergies   Iodine-131, Ivp dye [iodinated diagnostic agents], Ketorolac tromethamine, Tylenol [acetaminophen], Nsaids, and Vancomycin   Review of Systems Review of Systems  Constitutional: Negative.   HENT: Negative.   Respiratory: Positive for cough.   Cardiovascular: Negative.   Gastrointestinal: Positive for nausea.  Neurological: Positive for headaches.  All other systems reviewed and are negative.    Physical Exam Triage Vital Signs ED Triage Vitals  Enc Vitals Group     BP 11/14/19 1608 122/74     Pulse Rate 11/14/19 1608 (!) 102     Resp 11/14/19 1608 18     Temp 11/14/19 1608 98.3 F (36.8 C)     Temp src --      SpO2 11/14/19 1608 94 %     Weight --      Height --      Head Circumference --      Peak Flow --      Pain Score 11/14/19 1607 8     Pain Loc --      Pain Edu? --      Excl. in [place]? --    No data found.  Updated Vital Signs BP 122/74   Pulse (!) 102   Temp 98.3 F (36.8 C)   Resp  18   SpO2 94%   Visual Acuity Right Eye Distance:   Left Eye Distance:  Bilateral Distance:    Right Eye Near:   Left Eye Near:    Bilateral Near:     Physical Exam Vitals and nursing note reviewed.  Constitutional:      General: She is not in acute distress.    Appearance: Normal appearance. She is normal weight. She is not ill-appearing or toxic-appearing.  HENT:     Head: Normocephalic.     Right Ear: Tympanic membrane, ear canal and external ear normal. There is no impacted cerumen.     Left Ear: Tympanic membrane, ear canal and external ear normal. There is no impacted cerumen.     Nose: Nose normal. No congestion.     Mouth/Throat:     Mouth: Mucous membranes are moist.     Pharynx: Oropharynx is clear. No oropharyngeal exudate or posterior oropharyngeal erythema.  Cardiovascular:     Rate and Rhythm: Normal rate and regular rhythm.     Pulses: Normal pulses.     Heart sounds: Normal heart sounds. No murmur.  Pulmonary:     Effort: Pulmonary effort is normal. No respiratory distress.     Breath sounds: Normal breath sounds. No wheezing or rhonchi.  Chest:     Chest wall: No tenderness.  Abdominal:     General: Abdomen is flat. Bowel sounds are normal. There is no distension.     Palpations: There is no mass.     Tenderness: There is no abdominal tenderness.  Skin:    Capillary Refill: Capillary refill takes less than 2 seconds.  Neurological:     General: No focal deficit present.     Mental Status: She is alert and oriented to person, place, and time.      UC Treatments / Results  Labs (all labs ordered are listed, but only abnormal results are displayed) Labs Reviewed  NOVEL CORONAVIRUS, NAA    EKG   Radiology No results found.  Procedures Procedures (including critical care time)  Medications Ordered in UC Medications - No data to display  Initial Impression / Assessment and Plan / UC Course  I have reviewed the triage vital signs and the  nursing notes.  Pertinent labs & imaging results that were available during my care of the patient were reviewed by me and considered in my medical decision making (see chart for details).    COVID-19 test was ordered.  Patient was advised to quarantine until COVID-19 test result become available.  To go to ED for worsening of symptoms.  Patient verbalized understanding of the plan of care.  Final Clinical Impressions(s) / UC Diagnoses   Final diagnoses:  Suspected COVID-19 virus infection     Discharge Instructions     COVID testing ordered.  It will take between 2-7 days for test results.  Someone will contact you regarding abnormal results.    In the meantime: You should remain isolated in your home for 10 days from symptom onset AND greater than 72 hours after symptoms resolution (absence of fever without the use of fever-reducing medication and improvement in respiratory symptoms), whichever is longer Get plenty of rest and push fluids Tessalon Perles prescribed for cough Zofran prescribed for nausea Flonase prescribed for nasal congestion and runny nose Use medications daily for symptom relief Use OTC medications like ibuprofen or tylenol as needed fever or pain Call or go to the ED if you have any new or worsening symptoms such as fever, worsening cough, shortness of breath, chest tightness, chest pain, turning blue, changes in mental  status, etc...     ED Prescriptions    None     PDMP not reviewed this encounter.   Emerson Monte, FNP 11/14/19 1627

## 2019-11-14 NOTE — ED Triage Notes (Signed)
Pt presents with c/o cough headache and nausea for past few days

## 2019-11-14 NOTE — Discharge Instructions (Addendum)
COVID testing ordered.  It will take between 2-7 days for test results.  Someone will contact you regarding abnormal results.    In the meantime: You should remain isolated in your home for 10 days from symptom onset AND greater than 72 hours after symptoms resolution (absence of fever without the use of fever-reducing medication and improvement in respiratory symptoms), whichever is longer Get plenty of rest and push fluids Tessalon Perles prescribed for cough Zofran prescribed for nausea Flonase prescribed for nasal congestion and runny nose Use medications daily for symptom relief Use OTC medications like ibuprofen or tylenol as needed fever or pain Call or go to the ED if you have any new or worsening symptoms such as fever, worsening cough, shortness of breath, chest tightness, chest pain, turning blue, changes in mental status, etc..Marland Kitchen

## 2019-11-16 LAB — NOVEL CORONAVIRUS, NAA: SARS-CoV-2, NAA: NOT DETECTED

## 2019-11-30 ENCOUNTER — Ambulatory Visit: Payer: Medicaid Other | Admitting: Psychiatry

## 2019-12-04 ENCOUNTER — Other Ambulatory Visit: Payer: Self-pay

## 2019-12-04 ENCOUNTER — Emergency Department (HOSPITAL_COMMUNITY)
Admission: EM | Admit: 2019-12-04 | Discharge: 2019-12-04 | Disposition: A | Discharge status: Home / Self Care | Payer: Medicaid Other | Attending: Emergency Medicine | Admitting: Emergency Medicine

## 2019-12-04 ENCOUNTER — Ambulatory Visit
Admission: EM | Admit: 2019-12-04 | Discharge: 2019-12-04 | Disposition: A | Discharge status: Home / Self Care | Payer: Medicaid Other | Attending: Emergency Medicine | Admitting: Emergency Medicine

## 2019-12-04 ENCOUNTER — Encounter (HOSPITAL_COMMUNITY): Payer: Self-pay | Admitting: Emergency Medicine

## 2019-12-04 ENCOUNTER — Emergency Department (HOSPITAL_COMMUNITY): Payer: Medicaid Other

## 2019-12-04 DIAGNOSIS — R1031 Right lower quadrant pain: Secondary | ICD-10-CM | POA: Diagnosis not present

## 2019-12-04 DIAGNOSIS — J449 Chronic obstructive pulmonary disease, unspecified: Secondary | ICD-10-CM | POA: Diagnosis not present

## 2019-12-04 DIAGNOSIS — R739 Hyperglycemia, unspecified: Secondary | ICD-10-CM

## 2019-12-04 DIAGNOSIS — Z87891 Personal history of nicotine dependence: Secondary | ICD-10-CM | POA: Insufficient documentation

## 2019-12-04 DIAGNOSIS — Z793 Long term (current) use of hormonal contraceptives: Secondary | ICD-10-CM | POA: Diagnosis not present

## 2019-12-04 DIAGNOSIS — E1165 Type 2 diabetes mellitus with hyperglycemia: Secondary | ICD-10-CM | POA: Diagnosis not present

## 2019-12-04 DIAGNOSIS — Z79899 Other long term (current) drug therapy: Secondary | ICD-10-CM | POA: Insufficient documentation

## 2019-12-04 DIAGNOSIS — R110 Nausea: Secondary | ICD-10-CM

## 2019-12-04 DIAGNOSIS — Z794 Long term (current) use of insulin: Secondary | ICD-10-CM | POA: Insufficient documentation

## 2019-12-04 LAB — CBC
HCT: 43.3 % (ref 36.0–46.0)
Hemoglobin: 14.2 g/dL (ref 12.0–15.0)
MCH: 28.7 pg (ref 26.0–34.0)
MCHC: 32.8 g/dL (ref 30.0–36.0)
MCV: 87.5 fL (ref 80.0–100.0)
Platelets: 73 10*3/uL — ABNORMAL LOW (ref 150–400)
RBC: 4.95 MIL/uL (ref 3.87–5.11)
RDW: 13.8 % (ref 11.5–15.5)
WBC: 4.1 10*3/uL (ref 4.0–10.5)
nRBC: 0 % (ref 0.0–0.2)

## 2019-12-04 LAB — URINALYSIS, ROUTINE W REFLEX MICROSCOPIC
Bilirubin Urine: NEGATIVE
Glucose, UA: >=500 mg/dL — AB
Hgb urine dipstick: NEGATIVE
Ketones, ur: NEGATIVE mg/dL
Leukocytes,Ua: NEGATIVE
Nitrite: NEGATIVE
Protein, ur: NEGATIVE mg/dL
Specific Gravity, Urine: 1.029 (ref 1.005–1.030)
pH: 5 (ref 5.0–8.0)

## 2019-12-04 LAB — COMPREHENSIVE METABOLIC PANEL
ALT: 39 U/L (ref 0–44)
AST: 42 U/L — ABNORMAL HIGH (ref 15–41)
Albumin: 3.8 g/dL (ref 3.5–5.0)
Alkaline Phosphatase: 102 U/L (ref 38–126)
Anion gap: 11 (ref 5–15)
BUN: 10 mg/dL (ref 6–20)
CO2: 24 mmol/L (ref 22–32)
Calcium: 9 mg/dL (ref 8.9–10.3)
Chloride: 99 mmol/L (ref 98–111)
Creatinine, Ser: 0.68 mg/dL (ref 0.44–1.00)
GFR calc Af Amer: >60 mL/min (ref >60)
GFR calc non Af Amer: >60 mL/min (ref >60)
Glucose, Bld: 312 mg/dL — ABNORMAL HIGH (ref 70–99)
Potassium: 3.7 mmol/L (ref 3.5–5.1)
Sodium: 134 mmol/L — ABNORMAL LOW (ref 135–145)
Total Bilirubin: 1.1 mg/dL (ref 0.3–1.2)
Total Protein: 7.4 g/dL (ref 6.5–8.1)

## 2019-12-04 LAB — POCT URINALYSIS DIP (MANUAL ENTRY)
Blood, UA: NEGATIVE
Glucose, UA: NEGATIVE mg/dL
Ketones, POC UA: NEGATIVE mg/dL
Leukocytes, UA: NEGATIVE
Nitrite, UA: NEGATIVE
Protein Ur, POC: 30 mg/dL — AB
Spec Grav, UA: >=1.03 — AB (ref 1.010–1.025)
Urobilinogen, UA: 1 E.U./dL
pH, UA: 6 (ref 5.0–8.0)

## 2019-12-04 LAB — POC SARS CORONAVIRUS 2 AG -  ED: SARS Coronavirus 2 Ag: NEGATIVE

## 2019-12-04 LAB — POCT INFLUENZA A/B
Influenza A, POC: NEGATIVE
Influenza B, POC: NEGATIVE

## 2019-12-04 LAB — LIPASE, BLOOD: Lipase: 18 U/L (ref 11–51)

## 2019-12-04 MED ORDER — PROMETHAZINE HCL 12.5 MG PO TABS
25.0000 mg | ORAL_TABLET | Freq: Once | ORAL | Status: AC
  Administered 2019-12-04: 12.5 mg via ORAL
  Filled 2019-12-04: qty 2

## 2019-12-04 MED ORDER — PROMETHAZINE HCL 25 MG PO TABS: 25.0000 mg | ORAL_TABLET | Freq: Four times a day (QID) | ORAL | 0 refills | Status: DC | PRN

## 2019-12-04 MED ORDER — MORPHINE SULFATE (PF) 4 MG/ML IV SOLN
4.0000 mg | Freq: Once | INTRAVENOUS | Status: AC
  Administered 2019-12-04: 4 mg via INTRAVENOUS
  Filled 2019-12-04: qty 1

## 2019-12-04 MED ORDER — MORPHINE SULFATE (PF) 2 MG/ML IV SOLN
2.0000 mg | Freq: Once | INTRAVENOUS | Status: AC
  Administered 2019-12-04: 2 mg via INTRAVENOUS
  Filled 2019-12-04: qty 1

## 2019-12-04 MED ORDER — ONDANSETRON 4 MG PO TBDP
4.0000 mg | ORAL_TABLET | Freq: Once | ORAL | Status: AC
  Administered 2019-12-04: 4 mg via ORAL

## 2019-12-04 MED ORDER — ONDANSETRON HCL 4 MG/2ML IJ SOLN
4.0000 mg | Freq: Once | INTRAMUSCULAR | Status: AC
  Administered 2019-12-04: 4 mg via INTRAVENOUS
  Filled 2019-12-04: qty 2

## 2019-12-04 NOTE — ED Triage Notes (Signed)
Pt sent from Urgent Care r/t RLQ abd pain and nausea since last night. Pt denies fever, diarrhea.

## 2019-12-04 NOTE — Discharge Instructions (Addendum)
[place] diet as tolerated.  Be sure to take your insulin when you return home.  Follow-up with your primary doctor this week.  You also need to follow-up with your gastroenterologist.  Return to emergency department if you develop any worsening symptoms such as increasing abdominal pain, fever, or persistent vomiting.

## 2019-12-04 NOTE — ED Provider Notes (Signed)
[facility]    CSN: [id number] Arrival date & time: 12/04/19  0957      History   Chief Complaint Chief Complaint  Patient presents with  . nausea, body aches, abd pain    HPI Jaime [name] is a 34 y.o. female.   Reports that she has been experiencing abdominal pain and nausea since yesterday. Reports decreased appetite, body aches. Denies fever, chills, rash, vomiting, diarrhea, pain with urination, urinary urgency, urinary frequency. Reports that she took Zofran at 5am this morning. Reports that she still has her appendix and that she has had her gallbladder removed.   The history is provided by the patient.    Past Medical History:  Diagnosis Date  . Anxiety   . Chronic abdominal pain   . Chronic back pain   . Cirrhosis ([place])   . COPD (chronic obstructive pulmonary disease) ([place])   . Depression   . Diabetes mellitus without complication ([place])   . Hepatitis C   . Insomnia   . Long-term current use of methadone for opiate dependence ([place])   . Migraine headache   . Peptic ulcer     Patient Active Problem List   Diagnosis Date Noted  . Pressure injury of skin 01/29/2019  . Uncontrolled diabetes mellitus ([place]) 09/19/2018  . Hyperglycemia 09/17/2018  . Acute respiratory failure with hypoxia ([place]) 09/15/2018  . Anxiety 09/15/2018  . Chronic abdominal pain 09/15/2018  . Diabetes mellitus without complication ([place]) 71/04/2693  . Hepatitis C 09/15/2018  . Peptic ulcer 09/15/2018  . Long-term current use of methadone for opiate dependence ([place]) 09/15/2018  . Cirrhosis ([place]) 09/15/2018  . Acute hypoxemic respiratory failure ([place]) 07/17/2016  . Pleural effusion on right 07/17/2016  . Multiple rib fractures involving four or more ribs 07/17/2016  . Hemothorax on right 07/17/2016  . Chronic pain disorder 07/17/2016    Past Surgical History:  Procedure Laterality Date  . CHOLECYSTECTOMY      OB History    Gravida      Para      Term      Preterm      AB      Living  0     SAB      TAB      Ectopic      Multiple      Live Births               Home Medications    Prior to Admission medications   Medication Sig Start Date End Date Taking? Authorizing Provider  albuterol (PROVENTIL) (2.5 MG/3ML) 0.083% nebulizer solution Take 2.5 mg by nebulization every 6 (six) hours as needed for wheezing or shortness of breath.    [provider]  benzonatate (TESSALON) 100 MG capsule Take 1 capsule (100 mg total) by mouth every 8 (eight) hours. 11/14/19   Avegno, Darrelyn Hillock, FNP  clonazePAM (KLONOPIN) 1 MG tablet Take 1 tablet (1 mg total) by mouth 2 (two) times daily. 09/07/19 12/06/19  Norman Clay, MD  etonogestrel (NEXPLANON) 68 MG IMPL implant 1 each by Subdermal route once.    [provider]  fluticasone (FLONASE) 50 MCG/ACT nasal spray Place 1 spray into both nostrils daily for 14 days. 11/14/19 11/28/19  Avegno, Darrelyn Hillock, FNP  insulin NPH-regular Human (70-30) 100 UNIT/ML injection Inject 0-6 Units into the skin 3 (three) times daily. Per sliding scale  200= 2 units 250= 4 units 350= 6 units 400= Go to  ER 08/24/18  [provider]  insulin regular (NOVOLIN R,HUMULIN R) 100 units/mL injection Inject 10 Units into the skin 2 (two) times daily before a meal.     [provider]  metFORMIN (GLUCOPHAGE-XR) 500 MG 24 hr tablet Take 1,000 mg by mouth 2 (two) times daily.    [provider]  methadone (DOLOPHINE) 10 MG/5ML solution Take 50 mg by mouth every morning.     [provider]  metoCLOPramide (REGLAN) 10 MG tablet Take 1 tablet (10 mg total) by mouth every 6 (six) hours. 06/04/19   Isla Pence, MD  ondansetron (ZOFRAN) 4 MG tablet Take 1 tablet (4 mg total) by mouth every 8 (eight) hours as needed for nausea or vomiting. 11/14/19   Avegno, Darrelyn Hillock, FNP  prochlorperazine (COMPAZINE) 10 MG tablet Take 10 mg by mouth 4 (four) times daily as needed for nausea or  vomiting.  12/08/18   [provider]  promethazine (PHENERGAN) 25 MG tablet Take 25 mg by mouth daily as needed for nausea or vomiting.    [provider]  RESTASIS 0.05 % ophthalmic emulsion Place 1 drop into both eyes 2 (two) times daily.  04/22/19   [provider]  Suvorexant (BELSOMRA) 20 MG TABS Take 20 mg by mouth at bedtime.     [provider]  tetrahydrozoline 0.05 % ophthalmic solution Place 2 drops into both eyes 2 (two) times daily as needed (allergies/dry/itchy).    [provider]  venlafaxine XR (EFFEXOR-XR) 150 MG 24 hr capsule Take total of 225 mg daily (150 mg + 75 mg ) 11/08/19   Norman Clay, MD  venlafaxine XR (EFFEXOR-XR) 75 MG 24 hr capsule Take total of 225 mg daily (150 mg + 75 mg ) 08/30/19   Hisada, Elie Goody, MD  zolpidem (AMBIEN) 10 MG tablet Take 1 tablet by mouth at bedtime.  03/14/18   [provider]    Family History Family History  Problem Relation Age of Onset  . Hypertension Mother   . Hyperlipidemia Other   . Alcohol abuse Maternal Grandfather     Social History Social History   Tobacco Use  . Smoking status: Former Smoker    Packs/day: 0.00    Years: 5.00    Pack years: 0.00  . Smokeless tobacco: Never Used  Substance Use Topics  . Alcohol use: No  . Drug use: No     Allergies   Iodine-131, Ivp dye [iodinated diagnostic agents], Ketorolac tromethamine, Tylenol [acetaminophen], Nsaids, and Vancomycin   Review of Systems Review of Systems  Constitutional: Positive for chills and fatigue. Negative for fever.  HENT: Negative for ear pain and sore throat.   Eyes: Negative for pain and visual disturbance.  Respiratory: Negative for cough and shortness of breath.   Cardiovascular: Negative for chest pain and palpitations.  Gastrointestinal: Positive for abdominal pain and nausea. Negative for abdominal distention, constipation, diarrhea, rectal pain and vomiting.  Genitourinary: Negative for  dysuria, flank pain and hematuria.  Musculoskeletal: Negative for arthralgias and back pain.  Skin: Negative for color change and rash.  Neurological: Negative for dizziness, seizures, syncope, light-headedness and headaches.  All other systems reviewed and are negative.    Physical Exam Triage Vital Signs ED Triage Vitals  Enc Vitals Group     BP 12/04/19 1013 138/90     Pulse Rate 12/04/19 1013 98     Resp 12/04/19 1013 18     Temp 12/04/19 1013 97.8 F (36.6 C)     Temp  Source 12/04/19 1013 Oral     SpO2 12/04/19 1013 94 %     Weight --      Height --      Head Circumference --      Peak Flow --      Pain Score 12/04/19 1015 8     Pain Loc --      Pain Edu? --      Excl. in [place]? --    No data found.  Updated Vital Signs BP 138/90 (BP Location: Right Arm)   Pulse 98   Temp 97.8 F (36.6 C) (Oral)   Resp 18   SpO2 94%   Visual Acuity Right Eye Distance:   Left Eye Distance:   Bilateral Distance:    Right Eye Near:   Left Eye Near:    Bilateral Near:     Physical Exam Vitals and nursing note reviewed.  Constitutional:      General: She is not in acute distress.    Appearance: She is well-developed. She is obese. She is ill-appearing.  HENT:     Head: Normocephalic and atraumatic.     Right Ear: Tympanic membrane normal.     Left Ear: Tympanic membrane normal.     Nose: Nose normal.  Eyes:     Conjunctiva/sclera: Conjunctivae normal.     Pupils: Pupils are equal, round, and reactive to light.  Cardiovascular:     Rate and Rhythm: Normal rate and regular rhythm.     Pulses: Normal pulses.     Heart sounds: Normal heart sounds. No murmur.  Pulmonary:     Effort: Pulmonary effort is normal. No respiratory distress.     Breath sounds: Normal breath sounds. No stridor. No wheezing or rhonchi.  Abdominal:     General: Bowel sounds are normal. There is no distension.     Palpations: Abdomen is soft.     Tenderness: There is abdominal tenderness. There is  guarding and rebound. There is no right CVA tenderness or left CVA tenderness.     Hernia: No hernia is present.  Musculoskeletal:        General: Normal range of motion.     Cervical back: Neck supple.  Skin:    General: Skin is warm and dry.  Neurological:     General: No focal deficit present.     Mental Status: She is alert and oriented to person, place, and time.  Psychiatric:        Mood and Affect: Mood normal.        Behavior: Behavior normal.      UC Treatments / Results  Labs (all labs ordered are listed, but only abnormal results are displayed) Labs Reviewed  POCT URINALYSIS DIP (MANUAL ENTRY) - Abnormal; Notable for the following components:      Result Value   Color, UA brown (*)    Bilirubin, UA small (*)    Spec Grav, UA >=1.030 (*)    Protein Ur, POC =30 (*)    All other components within normal limits  POCT INFLUENZA A/B  POC SARS CORONAVIRUS 2 AG -  ED    EKG   Radiology No results found.  Procedures Procedures (including critical care time)  Medications Ordered in UC Medications  ondansetron (ZOFRAN-ODT) disintegrating tablet 4 mg (4 mg Oral Given 12/04/19 1053)    Initial Impression / Assessment and Plan / UC Course  I have reviewed the triage vital signs and the nursing notes.  Pertinent labs &  imaging results that were available during my care of the patient were reviewed by me and considered in my medical decision making (see chart for details).  Clinical Course as of Dec 04 1111  Mon Dec 04, 2019  1029 POC SARS Coronavirus 2 Ag-ED - [SM]  1035 POCT Influenza A/B [SM]  66 POC SARS Coronavirus 2 Ag-ED - [SM]  7473 POC SARS Coronavirus 2 Ag-ED - [SM]  4037 POCT Influenza A/B [SM]    Clinical Course User Index [SM] Faustino Congress, NP    Presents with RLQ abd pain, very tender to palpation, rebound tenderness present, nausea. Rapid flu, rapid Covid both negative in office today. UA + for protein and bilirubin, amber-orange in  color. Appears ill in office today, advised to go to the ED for further evaluation. Refused EMS, she did not drive herself to the office and will got to ED in personal vehicle with visitor.  Final Clinical Impressions(s) / UC Diagnoses   Final diagnoses:  Right lower quadrant abdominal pain  Nausea without vomiting     Discharge Instructions     You have been having abdominal pain with nausea. You had 62m of Zofran to help with the nausea.  You need to go directly to the Emergency Room for a scan of your abdomen.     ED Prescriptions    None     PDMP not reviewed this encounter.   MFaustino Congress NP 12/04/19 1113

## 2019-12-04 NOTE — Discharge Instructions (Signed)
You have been having abdominal pain with nausea. You had 71m of Zofran to help with the nausea.  You need to go directly to the Emergency Room for a scan of your abdomen.

## 2019-12-04 NOTE — ED Triage Notes (Signed)
Pt presents to UC w/ c/o mid to lower abd pain, nausea, body aches since yesterday. Pt states she can eat but gets very nauseous.

## 2019-12-05 NOTE — ED Provider Notes (Signed)
[facility] Provider Note   CSN: [id number] Arrival date & time: 12/04/19  1106     History Chief Complaint  Patient presents with  . Abdominal Pain    Jaime [name] is a 34 y.o. female.  HPI      Jaime [name] is a 34 y.o. female with past medical history of diabetes, hepatitis C, COPD, cirrhosis, and chronic abdominal pain who presents to the Emergency Department sent by local urgent care for further evaluation of right lower quadrant pain and possible appendicitis.  She describes the pain as constant and sharp.  Pain began on the evening prior to arrival.  The abdominal pain has been associated with nausea.  Denies fever or diarrhea.  She denies vaginal bleeding, discharge or possible pregnancy.  Pt has an implant for birth control.  Pain is been nonradiating.  She states pain is worse with palpation of her abdomen and with certain movements.  Surgical history includes previous cholecystectomy.   Past Medical History:  Diagnosis Date  . Anxiety   . Chronic abdominal pain   . Chronic back pain   . Cirrhosis ([place])   . COPD (chronic obstructive pulmonary disease) ([place])   . Depression   . Diabetes mellitus without complication ([place])   . Hepatitis C   . Insomnia   . Long-term current use of methadone for opiate dependence ([place])   . Migraine headache   . Peptic ulcer     Patient Active Problem List   Diagnosis Date Noted  . Pressure injury of skin 01/29/2019  . Uncontrolled diabetes mellitus ([place]) 09/19/2018  . Hyperglycemia 09/17/2018  . Acute respiratory failure with hypoxia ([place]) 09/15/2018  . Anxiety 09/15/2018  . Chronic abdominal pain 09/15/2018  . Diabetes mellitus without complication ([place]) 84/13/2440  . Hepatitis C 09/15/2018  . Peptic ulcer 09/15/2018  . Long-term current use of methadone for opiate dependence ([place]) 09/15/2018  . Cirrhosis ([place]) 09/15/2018  . Acute hypoxemic respiratory failure ([place]) 07/17/2016  . Pleural effusion  on right 07/17/2016  . Multiple rib fractures involving four or more ribs 07/17/2016  . Hemothorax on right 07/17/2016  . Chronic pain disorder 07/17/2016    Past Surgical History:  Procedure Laterality Date  . CHOLECYSTECTOMY       OB History    Gravida      Para      Term      Preterm      AB      Living  0     SAB      TAB      Ectopic      Multiple      Live Births              Family History  Problem Relation Age of Onset  . Hypertension Mother   . Hyperlipidemia Other   . Alcohol abuse Maternal Grandfather     Social History   Tobacco Use  . Smoking status: Former Smoker    Packs/day: 0.00    Years: 5.00    Pack years: 0.00  . Smokeless tobacco: Never Used  Substance Use Topics  . Alcohol use: No  . Drug use: No    Home Medications Prior to Admission medications   Medication Sig Start Date End Date Taking? Authorizing Provider  albuterol (PROVENTIL) (2.5 MG/3ML) 0.083% nebulizer solution Take 2.5 mg by nebulization every 6 (six) hours as needed for wheezing or shortness of breath.   Yes [provider]  etonogestrel (NEXPLANON) 68 MG IMPL implant 1 each by Subdermal route once.   Yes [provider]  insulin NPH-regular Human (70-30) 100 UNIT/ML injection Inject 0-6 Units into the skin 3 (three) times daily. Per sliding scale  200= 2 units 250= 4 units 350= 6 units 400= Go to  ER 08/24/18  Yes [provider]  insulin regular (NOVOLIN R,HUMULIN R) 100 units/mL injection Inject 10 Units into the skin 2 (two) times daily before a meal.    Yes [provider]  metFORMIN (GLUCOPHAGE-XR) 500 MG 24 hr tablet Take 1,000 mg by mouth 2 (two) times daily.   Yes [provider]  methadone (DOLOPHINE) 10 MG/5ML solution Take 90 mg by mouth every morning.    Yes [provider]  benzonatate (TESSALON) 100 MG capsule Take 1 capsule (100 mg total) by mouth every 8 (eight) hours. Patient not taking:  Reported on 12/04/2019 11/14/19   Emerson Monte, FNP  clonazePAM (KLONOPIN) 1 MG tablet Take 1 tablet (1 mg total) by mouth 2 (two) times daily. Patient not taking: Reported on 12/04/2019 09/07/19 12/06/19  Norman Clay, MD  fluticasone [facility]) 50 MCG/ACT nasal spray Place 1 spray into both nostrils daily for 14 days. 11/14/19 11/28/19  Avegno, Darrelyn Hillock, FNP  metoCLOPramide (REGLAN) 10 MG tablet Take 1 tablet (10 mg total) by mouth every 6 (six) hours. Patient not taking: Reported on 12/04/2019 06/04/19   Isla Pence, MD  ondansetron (ZOFRAN) 4 MG tablet Take 1 tablet (4 mg total) by mouth every 8 (eight) hours as needed for nausea or vomiting. Patient not taking: Reported on 12/04/2019 11/14/19   Emerson Monte, FNP  prochlorperazine (COMPAZINE) 10 MG tablet Take 10 mg by mouth 4 (four) times daily as needed for nausea or vomiting.  12/08/18   [provider]  promethazine (PHENERGAN) 25 MG tablet Take 1 tablet (25 mg total) by mouth every 6 (six) hours as needed for nausea or vomiting. 12/04/19   Karrington Studnicka, PA-C  RESTASIS 0.05 % ophthalmic emulsion Place 1 drop into both eyes 2 (two) times daily.  04/22/19   [provider]  Suvorexant (BELSOMRA) 20 MG TABS Take 20 mg by mouth at bedtime.     [provider]  tetrahydrozoline 0.05 % ophthalmic solution Place 2 drops into both eyes 2 (two) times daily as needed (allergies/dry/itchy).    [provider]  venlafaxine XR (EFFEXOR-XR) 150 MG 24 hr capsule Take total of 225 mg daily (150 mg + 75 mg ) Patient not taking: Reported on 12/04/2019 11/08/19   Norman Clay, MD  venlafaxine XR (EFFEXOR-XR) 75 MG 24 hr capsule Take total of 225 mg daily (150 mg + 75 mg ) Patient not taking: Reported on 12/04/2019 08/30/19   Norman Clay, MD  zolpidem (AMBIEN) 10 MG tablet Take 1 tablet by mouth at bedtime.  03/14/18   [provider]    Allergies    Iodine-131, Ivp dye [iodinated diagnostic agents], Ketorolac  tromethamine, Tylenol [acetaminophen], Nsaids, and Vancomycin  Review of Systems   Review of Systems  Constitutional: Negative for appetite change, chills and fever.  Respiratory: Negative for shortness of breath.   Cardiovascular: Negative for chest pain.  Gastrointestinal: Positive for abdominal pain and nausea. Negative for blood in stool and vomiting.  Genitourinary: Negative for decreased urine volume, difficulty urinating, dysuria and flank pain.  Musculoskeletal: Negative for back pain and myalgias.  Skin: Negative for color change and rash.  Neurological: Negative for dizziness, weakness  and numbness.  Hematological: Negative for adenopathy.    Physical Exam Updated Vital Signs BP (!) 169/83 (BP Location: Right Arm)   Pulse 100   Temp 98.2 F (36.8 C) (Oral)   Resp 18   Ht 5' 6"  (1.676 m)   Wt 117.9 kg   SpO2 96%   BMI 41.97 kg/m   Physical Exam Vitals and nursing note reviewed.  Constitutional:      Appearance: Normal appearance. She is well-developed. She is not ill-appearing or toxic-appearing.  HENT:     Mouth/Throat:     Mouth: Mucous membranes are moist.  Cardiovascular:     Rate and Rhythm: Normal rate and regular rhythm.  Pulmonary:     Effort: Pulmonary effort is normal.     Breath sounds: Normal breath sounds.  Chest:     Chest wall: No tenderness.  Abdominal:     Palpations: Abdomen is soft. There is no mass.     Tenderness: There is abdominal tenderness. There is no right CVA tenderness, left CVA tenderness or guarding.     Comments: Patient has tenderness to periumbilical and right lower quadrant.  No guarding or rebound tenderness.  Abdomen is soft.  Musculoskeletal:        General: Normal range of motion.  Skin:    Findings: No erythema or rash.  Neurological:     Mental Status: She is alert.     Sensory: No sensory deficit.     Motor: No weakness.     ED Results / Procedures / Treatments   Labs (all labs ordered are listed, but only  abnormal results are displayed) Labs Reviewed  COMPREHENSIVE METABOLIC PANEL - Abnormal; Notable for the following components:      Result Value   Sodium 134 (*)    Glucose, Bld 312 (*)    AST 42 (*)    All other components within normal limits  CBC - Abnormal; Notable for the following components:   Platelets 73 (*)    All other components within normal limits  URINALYSIS, ROUTINE W REFLEX MICROSCOPIC - Abnormal; Notable for the following components:   Color, Urine AMBER (*)    APPearance HAZY (*)    Glucose, UA >=500 (*)    Bacteria, UA RARE (*)    All other components within normal limits  LIPASE, BLOOD  POC URINE PREG, ED    EKG None  Radiology CT ABDOMEN PELVIS WO CONTRAST  Result Date: 12/04/2019 CLINICAL DATA:  Right lower quadrant abdominal pain and nausea since yesterday. EXAM: CT ABDOMEN AND PELVIS WITHOUT CONTRAST TECHNIQUE: Multidetector CT imaging of the abdomen and pelvis was performed following the standard protocol without IV contrast. COMPARISON:  08/24/2019 CT abdomen/pelvis. FINDINGS: Lower chest: No significant pulmonary nodules or acute consolidative airspace disease. Hepatobiliary: Diffuse hepatic steatosis. Diffusely irregular liver surface and heterogeneous liver parenchyma compatible cirrhosis. No discrete liver mass. Cholecystectomy. Bile ducts are stable and within normal post cholecystectomy limits. CBD diameter 6 mm. Pancreas: Normal, with no mass or duct dilation. Spleen: Marked splenomegaly. Craniocaudal splenic length 22.4 cm, not appreciably changed. No splenic mass. Adrenals/Urinary Tract: Normal adrenals. No hydronephrosis. No renal stones. No contour deforming renal masses. Normal bladder. Stomach/Bowel: Normal non-distended stomach. Normal caliber small bowel with no small bowel wall thickening. Normal appendix. Normal large bowel with no diverticulosis, large bowel wall thickening or pericolonic fat stranding. Vascular/Lymphatic: Normal caliber  abdominal aorta. Enlarged 1.4 cm porta hepatis node (series 2/image 31), unchanged. No new pathologically enlarged lymph nodes in  the abdomen or pelvis. Reproductive: Grossly normal uterus.  No adnexal mass. Other: No pneumoperitoneum, ascites or focal fluid collection. Musculoskeletal: No aggressive appearing focal osseous lesions. Mild thoracolumbar spondylosis. IMPRESSION: 1. No acute abnormality. No evidence of bowel obstruction or acute bowel inflammation. Normal appendix. 2. Cirrhosis. Diffuse hepatic steatosis. No discrete liver mass on this noncontrast scan. 3. Marked splenomegaly, unchanged.  No ascites. 4. Chronic mild porta hepatis adenopathy is unchanged, compatible with benign reactive etiology. Electronically Signed   By: Ilona Sorrel M.D.   On: 12/04/2019 12:55    Procedures Procedures (including critical care time)  Medications Ordered in ED Medications  ondansetron (ZOFRAN) injection 4 mg (4 mg Intravenous Given 12/04/19 1301)  morphine 4 MG/ML injection 4 mg (4 mg Intravenous Given 12/04/19 1301)  morphine 2 MG/ML injection 2 mg (2 mg Intravenous Given 12/04/19 1644)  promethazine (PHENERGAN) tablet 25 mg (12.5 mg Oral Given 12/04/19 1645)    ED Course  I have reviewed the triage vital signs and the nursing notes.  Pertinent labs & imaging results that were available during my care of the patient were reviewed by me and considered in my medical decision making (see chart for details).   MDM Rules/Calculators/A&P                      Diabetic patient with right periumbilical to right lower quadrant pain present for one day.  CT abdomen/pelvis is neg for appendicitis.  Pt reports feeling better after IV medication and she remains afebrile, no vomiting. Pt's blood sugar is elevated, no clinical signs of DKA and anion gap is wnml.  She admits to eating prior to arrival.  Advised her to take her insulin when she returns home.  Pt states that she is feeling better and ready for d/c home  and that her ride is waiting for her. Clinical suspicion for emergent process is low and pt agrees to close PCP f/u.  Return precautions discussed.     Final Clinical Impression(s) / ED Diagnoses Final diagnoses:  Right lower quadrant abdominal pain  Hyperglycemia    Rx / DC Orders ED Discharge Orders         Ordered    promethazine (PHENERGAN) 25 MG tablet  Every 6 hours PRN     12/04/19 1706           Mahealani Sulak, [place], PA-C 12/06/19 0000    Daleen Bo, MD 12/07/19 1000

## 2019-12-06 LAB — BASIC METABOLIC PANEL
BUN: 8 (ref 4–21)
Creatinine: 0.6 (ref 0.5–1.1)

## 2019-12-06 LAB — HEMOGLOBIN A1C: Hemoglobin A1C: 10.4

## 2019-12-07 LAB — LIPID PANEL
Cholesterol: 197 (ref 0–200)
HDL: 39 (ref 35–70)
LDL Cholesterol: 133
Triglycerides: 134 (ref 40–160)
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
## 2019-12-21 ENCOUNTER — Ambulatory Visit: Payer: Medicaid Other | Admitting: Psychiatry

## 2019-12-21 ENCOUNTER — Ambulatory Visit (INDEPENDENT_AMBULATORY_CARE_PROVIDER_SITE_OTHER): Payer: Medicaid Other | Admitting: Psychiatry

## 2019-12-21 ENCOUNTER — Other Ambulatory Visit: Payer: Self-pay

## 2019-12-21 ENCOUNTER — Encounter (HOSPITAL_COMMUNITY): Payer: Self-pay | Admitting: Psychiatry

## 2019-12-21 DIAGNOSIS — F330 Major depressive disorder, recurrent, mild: Secondary | ICD-10-CM | POA: Diagnosis not present

## 2019-12-21 DIAGNOSIS — F419 Anxiety disorder, unspecified: Secondary | ICD-10-CM

## 2019-12-21 MED ORDER — ZOLPIDEM TARTRATE 5 MG PO TABS: 5.0000 mg | ORAL_TABLET | Freq: Every evening | ORAL | 0 refills | Status: DC | PRN

## 2019-12-21 MED ORDER — BUSPIRONE HCL 5 MG PO TABS: 5.0000 mg | ORAL_TABLET | Freq: Three times a day (TID) | ORAL | 0 refills | Status: DC

## 2019-12-21 NOTE — Patient Instructions (Signed)
1.Continuevenlafaxine 225 mg daily  2. Start Buspar 5 mg three times a day  3. Hold clonazepam 4. Continue Ambien 5 mg at night as needed for insomnia  5. Next appointment: 3/16 at 10:30  6. Referral to therapy
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
## 2020-01-09 ENCOUNTER — Ambulatory Visit
Admission: EM | Admit: 2020-01-09 | Discharge: 2020-01-09 | Disposition: A | Discharge status: Home / Self Care | Payer: Medicaid Other | Attending: Emergency Medicine | Admitting: Emergency Medicine

## 2020-01-09 ENCOUNTER — Encounter: Payer: Self-pay | Admitting: Emergency Medicine

## 2020-01-09 ENCOUNTER — Other Ambulatory Visit: Payer: Self-pay

## 2020-01-09 DIAGNOSIS — E1140 Type 2 diabetes mellitus with diabetic neuropathy, unspecified: Secondary | ICD-10-CM

## 2020-01-09 DIAGNOSIS — K117 Disturbances of salivary secretion: Secondary | ICD-10-CM

## 2020-01-09 MED ORDER — NYSTATIN NICU ORAL SYRINGE 100,000 UNITS/ML: 2.0000 mL | Freq: Four times a day (QID) | OROMUCOSAL | 1 refills | Status: DC

## 2020-01-09 MED ORDER — NYSTATIN 100000 UNIT/ML MT SUSP: 500000.0000 [IU] | Freq: Three times a day (TID) | OROMUCOSAL | 1 refills | Status: DC

## 2020-01-09 MED ORDER — GABAPENTIN 100 MG PO CAPS: 100.0000 mg | ORAL_CAPSULE | Freq: Three times a day (TID) | ORAL | 0 refills | Status: DC

## 2020-01-09 MED ORDER — CEVIMELINE HCL 30 MG PO CAPS: 30.0000 mg | ORAL_CAPSULE | Freq: Three times a day (TID) | ORAL | 0 refills | Status: AC

## 2020-01-09 NOTE — Discharge Instructions (Addendum)
Advised patient to take medication as prescribed Follow-up with primary care for further evaluation regarding diabetes as dry mouth and diabetic neuropathy symptom may  be related to uncontrolled diabetes Follow-up with mental health provider Increase fluid intake Healthy diet and exercise Proper dental hygiene May suck on ice chips, nightly sugarless gum to increase salivation  Return or go to ED for worsening of symptoms

## 2020-01-09 NOTE — ED Triage Notes (Signed)
Pt sts bilateral foot pain with some cramping; pt sts some pain on her tongue

## 2020-01-09 NOTE — ED Provider Notes (Signed)
[facility]    CSN: [id number] Arrival date & time: 01/09/20  1519      History   Chief Complaint Chief Complaint  Patient presents with  . Foot Pain    HPI Jaime [name] is a 34 y.o. female.   With history of diabetes presented to the urgent care with a  complaint of bilateral lower extremities tingling for the past 1 month.  Denies a precipitating event, or specific injury.  Patient localizes tingling and pain sensation to her lower extremities.  Describes it as constant and needle feeling in character.  Has tried OTC medications without relief.  Symptoms are made worse with walking.  Denies similar symptoms in the past.  Denies fever, chills, appetite change, weight change, chest pain, nausea, vomiting, changes in bowel or bladder habits. She is also complaining of dry mouth and sore tongue for the past 1 month.  Denies any precipitating event.  Has tried OTC Biotene mouthwash without relief.  Symptoms made worse with eating.  Denies similar symptoms in the past.   The history is provided by the patient. No language interpreter was used.    Past Medical History:  Diagnosis Date  . Anxiety   . Chronic abdominal pain   . Chronic back pain   . Cirrhosis ([place])   . COPD (chronic obstructive pulmonary disease) ([place])   . Depression   . Diabetes mellitus without complication ([place])   . Hepatitis C   . Insomnia   . Long-term current use of methadone for opiate dependence ([place])   . Migraine headache   . Peptic ulcer     Patient Active Problem List   Diagnosis Date Noted  . Pressure injury of skin 01/29/2019  . Uncontrolled diabetes mellitus ([place]) 09/19/2018  . Hyperglycemia 09/17/2018  . Acute respiratory failure with hypoxia ([place]) 09/15/2018  . Anxiety 09/15/2018  . Chronic abdominal pain 09/15/2018  . Diabetes mellitus without complication ([place]) 48/27/0786  . Hepatitis C 09/15/2018  . Peptic ulcer 09/15/2018  . Long-term current use of methadone for opiate  dependence ([place]) 09/15/2018  . Cirrhosis ([place]) 09/15/2018  . Acute hypoxemic respiratory failure ([place]) 07/17/2016  . Pleural effusion on right 07/17/2016  . Multiple rib fractures involving four or more ribs 07/17/2016  . Hemothorax on right 07/17/2016  . Chronic pain disorder 07/17/2016    Past Surgical History:  Procedure Laterality Date  . CHOLECYSTECTOMY      OB History    Gravida      Para      Term      Preterm      AB      Living  0     SAB      TAB      Ectopic      Multiple      Live Births               Home Medications    Prior to Admission medications   Medication Sig Start Date End Date Taking? Authorizing Provider  albuterol (PROVENTIL) (2.5 MG/3ML) 0.083% nebulizer solution Take 2.5 mg by nebulization every 6 (six) hours as needed for wheezing or shortness of breath.    [provider]  benzonatate (TESSALON) 100 MG capsule Take 1 capsule (100 mg total) by mouth every 8 (eight) hours. Patient not taking: Reported on 12/04/2019 11/14/19   Crue Otero, Darrelyn Hillock, FNP  busPIRone (BUSPAR) 5 MG tablet Take 1 tablet (5 mg total) by mouth 3 (three) times daily.  12/21/19   Norman Clay, MD  cevimeline (EVOXAC) 30 MG capsule Take 1 capsule (30 mg total) by mouth 3 (three) times daily for 5 days. 01/09/20 01/14/20  Labarron Durnin, Darrelyn Hillock, FNP  clonazePAM (KLONOPIN) 1 MG tablet Take 1 tablet (1 mg total) by mouth 2 (two) times daily. Patient not taking: Reported on 12/04/2019 09/07/19 12/06/19  Norman Clay, MD  etonogestrel (NEXPLANON) 68 MG IMPL implant 1 each by Subdermal route once.    [provider]  fluticasone (FLONASE) 50 MCG/ACT nasal spray Place 1 spray into both nostrils daily for 14 days. 11/14/19 11/28/19  Amilee Janvier, Darrelyn Hillock, FNP  gabapentin (NEURONTIN) 100 MG capsule Take 1 capsule (100 mg total) by mouth 3 (three) times daily. 01/09/20   Mckala Pantaleon, Darrelyn Hillock, FNP  insulin NPH-regular Human (70-30) 100 UNIT/ML injection Inject 0-6 Units into the  skin 3 (three) times daily. Per sliding scale  200= 2 units 250= 4 units 350= 6 units 400= Go to  ER 08/24/18   [provider]  insulin regular (NOVOLIN R,HUMULIN R) 100 units/mL injection Inject 10 Units into the skin 2 (two) times daily before a meal.     [provider]  metFORMIN (GLUCOPHAGE-XR) 500 MG 24 hr tablet Take 1,000 mg by mouth 2 (two) times daily.    [provider]  methadone (DOLOPHINE) 10 MG/5ML solution Take 110 mg by mouth every morning.     [provider]  metoCLOPramide (REGLAN) 10 MG tablet Take 1 tablet (10 mg total) by mouth every 6 (six) hours. Patient not taking: Reported on 12/04/2019 06/04/19   Isla Pence, MD  nystatin (MYCOSTATIN) 100000 UNIT/ML suspension Take 5 mLs (500,000 Units total) by mouth in the morning, at noon, and at bedtime. 01/09/20   Kanijah Groseclose, Darrelyn Hillock, FNP  ondansetron (ZOFRAN) 4 MG tablet Take 1 tablet (4 mg total) by mouth every 8 (eight) hours as needed for nausea or vomiting. Patient not taking: Reported on 12/04/2019 11/14/19   Emerson Monte, FNP  prochlorperazine (COMPAZINE) 10 MG tablet Take 10 mg by mouth 4 (four) times daily as needed for nausea or vomiting.  12/08/18   [provider]  promethazine (PHENERGAN) 25 MG tablet Take 1 tablet (25 mg total) by mouth every 6 (six) hours as needed for nausea or vomiting. 12/04/19   Triplett, Tammy, PA-C  RESTASIS 0.05 % ophthalmic emulsion Place 1 drop into both eyes 2 (two) times daily.  04/22/19   [provider]  Suvorexant (BELSOMRA) 20 MG TABS Take 20 mg by mouth at bedtime.     [provider]  tetrahydrozoline 0.05 % ophthalmic solution Place 2 drops into both eyes 2 (two) times daily as needed (allergies/dry/itchy).    [provider]  venlafaxine XR (EFFEXOR-XR) 150 MG 24 hr capsule Take total of 225 mg daily (150 mg + 75 mg ) 11/08/19   Norman Clay, MD  venlafaxine XR (EFFEXOR-XR) 75 MG 24 hr capsule Take total of 225 mg  daily (150 mg + 75 mg ) 08/30/19   Hisada, Elie Goody, MD  zolpidem (AMBIEN) 5 MG tablet Take 1 tablet (5 mg total) by mouth at bedtime as needed for sleep. 12/21/19   Norman Clay, MD    Family History Family History  Problem Relation Age of Onset  . Hypertension Mother   . Hyperlipidemia Other   . Alcohol abuse Maternal Grandfather     Social History Social History   Tobacco Use  . Smoking status: Former Smoker  Packs/day: 0.00    Years: 5.00    Pack years: 0.00  . Smokeless tobacco: Never Used  Substance Use Topics  . Alcohol use: No  . Drug use: No     Allergies   Iodine-131, Ivp dye [iodinated diagnostic agents], Ketorolac tromethamine, Tylenol [acetaminophen], Nsaids, and Vancomycin   Review of Systems Review of Systems  Constitutional: Negative.   HENT:       Dry mouth  Respiratory: Negative.   Cardiovascular: Negative.   Musculoskeletal: Negative.   Neurological:       Tingling   All other systems reviewed and are negative.    Physical Exam Triage Vital Signs ED Triage Vitals  Enc Vitals Group     BP      Pulse      Resp      Temp      Temp src      SpO2      Weight      Height      Head Circumference      Peak Flow      Pain Score      Pain Loc      Pain Edu?      Excl. in [place]?    No data found.  Updated Vital Signs BP (!) 170/96 (BP Location: Right Arm)   Pulse (!) 112   Temp 98.4 F (36.9 C) (Oral)   Resp 18   SpO2 95%   Visual Acuity Right Eye Distance:   Left Eye Distance:   Bilateral Distance:    Right Eye Near:   Left Eye Near:    Bilateral Near:     Physical Exam Vitals and nursing note reviewed.  Constitutional:      General: She is not in acute distress.    Appearance: Normal appearance. She is obese. She is not ill-appearing, toxic-appearing or diaphoretic.  HENT:     Mouth/Throat:     Lips: Pink.     Mouth: Mucous membranes are dry.     Dentition: Normal dentition. No dental abscesses.     Tongue: No lesions.      Pharynx: Uvula midline. No oropharyngeal exudate.     Tonsils: No tonsillar exudate.  Cardiovascular:     Rate and Rhythm: Normal rate and regular rhythm.     Pulses: Normal pulses.     Heart sounds: Normal heart sounds. No murmur. No gallop.   Pulmonary:     Effort: Pulmonary effort is normal. No respiratory distress.     Breath sounds: Normal breath sounds. No stridor. No wheezing, rhonchi or rales.  Chest:     Chest wall: No tenderness.  Musculoskeletal:        General: No swelling, tenderness, deformity or signs of injury. Normal range of motion.  Neurological:     General: No focal deficit present.     Mental Status: She is alert and oriented to person, place, and time.     Cranial Nerves: Cranial nerves are intact.     Sensory: Sensory deficit present.     Motor: Motor function is intact.     Coordination: Coordination is intact.     Gait: Gait is intact.      UC Treatments / Results  Labs (all labs ordered are listed, but only abnormal results are displayed) Labs Reviewed - No data to display  EKG   Radiology No results found.  Procedures Procedures (including critical care time)  Medications Ordered in UC Medications - No data  to display  Initial Impression / Assessment and Plan / UC Course  I have reviewed the triage vital signs and the nursing notes.  Pertinent labs & imaging results that were available during my care of the patient were reviewed by me and considered in my medical decision making (see chart for details).    Patient stable at discharge. Xerostomia, and there is neuropathic symptoms are more likely related to hyperglycemia.  Patient was advised to follow-up with PCP for further evaluation regarding uncontrolled diabetes.  She is in agreement.  Gabapentin was prescribed for diabetic neuropathy Nystatin was prescribed to prevent thrush due to dry mouth Short-term cevemeline was prescribed for dry mouth Follow-up with PCP and mental  health provider Return or go to ED for worsening of symptoms  Final Clinical Impressions(s) / UC Diagnoses   Final diagnoses:  Diabetic neuropathy, painful ([place])  Xerostomia     Discharge Instructions     Advised patient to take medication as prescribed Follow-up with primary care for further evaluation regarding diabetes as dry mouth and diabetic neuropathy symptom may  be related to uncontrolled diabetes Follow-up with mental health provider Increase fluid intake Healthy diet and exercise Proper dental hygiene May suck on ice chips, nightly sugarless gum to increase salivation  Return or go to ED for worsening of symptoms    ED Prescriptions    Medication Sig Dispense Auth. Provider   gabapentin (NEURONTIN) 100 MG capsule Take 1 capsule (100 mg total) by mouth 3 (three) times daily. 90 capsule Marwin Primmer S, FNP   nystatin (MYCOSTATIN) 100000 UNITS/ML SUSP  (Status: Discontinued) Take 2 mLs by mouth every 6 (six) hours. 60 mL Benaiah Behan, Darrelyn Hillock, FNP   cevimeline (EVOXAC) 30 MG capsule Take 1 capsule (30 mg total) by mouth 3 (three) times daily for 5 days. 15 capsule Quirino Kakos S, FNP   nystatin (MYCOSTATIN) 100000 UNIT/ML suspension Take 5 mLs (500,000 Units total) by mouth in the morning, at noon, and at bedtime. 60 mL Alise Calais, Darrelyn Hillock, FNP     PDMP not reviewed this encounter.   Emerson Monte, [place] 01/09/20 1621

## 2020-01-16 ENCOUNTER — Other Ambulatory Visit: Payer: Self-pay

## 2020-01-16 ENCOUNTER — Ambulatory Visit (INDEPENDENT_AMBULATORY_CARE_PROVIDER_SITE_OTHER): Payer: Medicaid Other | Admitting: Psychiatry

## 2020-01-16 ENCOUNTER — Encounter (HOSPITAL_COMMUNITY): Payer: Self-pay | Admitting: Psychiatry

## 2020-01-16 DIAGNOSIS — F1121 Opioid dependence, in remission: Secondary | ICD-10-CM | POA: Diagnosis not present

## 2020-01-16 DIAGNOSIS — F330 Major depressive disorder, recurrent, mild: Secondary | ICD-10-CM

## 2020-01-16 DIAGNOSIS — G4700 Insomnia, unspecified: Secondary | ICD-10-CM

## 2020-01-16 MED ORDER — VENLAFAXINE HCL ER 75 MG PO CP24: ORAL_CAPSULE | ORAL | 0 refills | Status: DC

## 2020-01-16 MED ORDER — VENLAFAXINE HCL ER 150 MG PO CP24: ORAL_CAPSULE | ORAL | 0 refills | Status: DC

## 2020-01-16 NOTE — Patient Instructions (Signed)
1.Continuevenlafaxine 225 mg daily 2. Discontinue buspar 3. Discontinue Ambien 4. Next appointment:5/5 at 10 AM  5. Referral to therapy

## 2020-02-14 ENCOUNTER — Other Ambulatory Visit: Payer: Self-pay

## 2020-02-14 ENCOUNTER — Ambulatory Visit (INDEPENDENT_AMBULATORY_CARE_PROVIDER_SITE_OTHER): Payer: Medicaid Other | Admitting: Psychiatry

## 2020-02-14 ENCOUNTER — Encounter (HOSPITAL_COMMUNITY): Payer: Self-pay | Admitting: Psychiatry

## 2020-02-14 DIAGNOSIS — G4700 Insomnia, unspecified: Secondary | ICD-10-CM | POA: Diagnosis not present

## 2020-02-14 DIAGNOSIS — F331 Major depressive disorder, recurrent, moderate: Secondary | ICD-10-CM

## 2020-02-14 MED ORDER — CLONAZEPAM 0.5 MG PO TABS: 0.5000 mg | ORAL_TABLET | Freq: Two times a day (BID) | ORAL | 0 refills | Status: DC
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
## 2020-02-14 NOTE — Patient Instructions (Signed)
1.Continuevenlafaxine 225 mg daily 2. Start clonazepam 0.5 mg twice a day as needed for anxiety 3. Next appointment:5/12 at 8:40  4. Please ask your neurologist to send the record to our clinic

## 2020-03-06 ENCOUNTER — Ambulatory Visit (HOSPITAL_COMMUNITY): Payer: Medicaid Other | Admitting: Psychiatry

## 2020-03-07 DIAGNOSIS — K760 Fatty (change of) liver, not elsewhere classified: Secondary | ICD-10-CM | POA: Insufficient documentation

## 2020-03-07 DIAGNOSIS — D890 Polyclonal hypergammaglobulinemia: Secondary | ICD-10-CM | POA: Insufficient documentation

## 2020-03-07 DIAGNOSIS — R161 Splenomegaly, not elsewhere classified: Secondary | ICD-10-CM | POA: Insufficient documentation

## 2020-03-07 DIAGNOSIS — D6959 Other secondary thrombocytopenia: Secondary | ICD-10-CM | POA: Insufficient documentation

## 2020-03-07 HISTORY — DX: Fatty (change of) liver, not elsewhere classified: K76.0
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
## 2020-03-08 DIAGNOSIS — R768 Other specified abnormal immunological findings in serum: Secondary | ICD-10-CM | POA: Insufficient documentation

## 2020-03-12 ENCOUNTER — Other Ambulatory Visit: Payer: Self-pay

## 2020-03-12 ENCOUNTER — Telehealth (INDEPENDENT_AMBULATORY_CARE_PROVIDER_SITE_OTHER): Payer: Medicaid Other | Admitting: Psychiatry

## 2020-03-12 ENCOUNTER — Encounter (HOSPITAL_COMMUNITY): Payer: Self-pay | Admitting: Psychiatry

## 2020-03-12 DIAGNOSIS — G4700 Insomnia, unspecified: Secondary | ICD-10-CM

## 2020-03-12 DIAGNOSIS — F331 Major depressive disorder, recurrent, moderate: Secondary | ICD-10-CM | POA: Diagnosis not present

## 2020-03-12 MED ORDER — QUETIAPINE FUMARATE 25 MG PO TABS: 25.0000 mg | ORAL_TABLET | Freq: Every day | ORAL | 1 refills | Status: DC

## 2020-03-12 MED ORDER — CLONAZEPAM 0.5 MG PO TABS: 0.5000 mg | ORAL_TABLET | Freq: Two times a day (BID) | ORAL | 1 refills | Status: DC

## 2020-03-12 NOTE — Patient Instructions (Signed)
1.Continuevenlafaxine 225 mg daily 2. Start quetiapine 25 mg at night  2. Continue clonazepam 0.5 mg twice a day as needed for anxiety 3. Next appointment:6/23 at 9 AM
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
## 2020-03-13 ENCOUNTER — Telehealth (HOSPITAL_COMMUNITY): Payer: Medicaid Other | Admitting: Psychiatry

## 2020-03-13 ENCOUNTER — Encounter (INDEPENDENT_AMBULATORY_CARE_PROVIDER_SITE_OTHER): Payer: Self-pay

## 2020-03-19 ENCOUNTER — Ambulatory Visit: Payer: Medicaid Other | Admitting: Family Medicine

## 2020-04-10 ENCOUNTER — Ambulatory Visit
Admission: EM | Admit: 2020-04-10 | Discharge: 2020-04-10 | Disposition: A | Discharge status: Home / Self Care | Payer: Medicaid Other | Attending: Emergency Medicine | Admitting: Emergency Medicine

## 2020-04-10 ENCOUNTER — Other Ambulatory Visit: Payer: Self-pay

## 2020-04-10 DIAGNOSIS — Z113 Encounter for screening for infections with a predominantly sexual mode of transmission: Secondary | ICD-10-CM | POA: Diagnosis not present

## 2020-04-10 DIAGNOSIS — N898 Other specified noninflammatory disorders of vagina: Secondary | ICD-10-CM | POA: Diagnosis not present

## 2020-04-10 HISTORY — DX: Unspecified convulsions: R56.9

## 2020-04-10 HISTORY — DX: Reserved for concepts with insufficient information to code with codable children: IMO0002

## 2020-04-10 HISTORY — DX: Polyneuropathy, unspecified: G62.9

## 2020-04-10 HISTORY — DX: Systemic lupus erythematosus, unspecified: M32.9

## 2020-04-10 LAB — POCT URINALYSIS DIP (MANUAL ENTRY)
Bilirubin, UA: NEGATIVE
Blood, UA: NEGATIVE
Glucose, UA: >=1000 mg/dL — AB
Ketones, POC UA: NEGATIVE mg/dL
Leukocytes, UA: NEGATIVE
Nitrite, UA: NEGATIVE
Protein Ur, POC: NEGATIVE mg/dL
Spec Grav, UA: 1.02 (ref 1.010–1.025)
Urobilinogen, UA: 1 E.U./dL
pH, UA: 7 (ref 5.0–8.0)

## 2020-04-10 LAB — POCT URINE PREGNANCY: Preg Test, Ur: NEGATIVE

## 2020-04-10 MED ORDER — FLUCONAZOLE 150 MG PO TABS: 150.0000 mg | ORAL_TABLET | Freq: Every day | ORAL | 0 refills | Status: DC

## 2020-04-10 NOTE — ED Triage Notes (Signed)
Pt c/o vaginal itching and swelling and increased urination x 2 days

## 2020-04-10 NOTE — ED Provider Notes (Signed)
[place]   [id number] 04/10/20 Arrival Time: 7829   Chief Complaint  Patient presents with  . Urinary Frequency  . Vaginal Itching    SUBJECTIVE:  Jaime [name] is a 34 y.o. female who presents to the urgent care for complaint of vaginal itching, irritation, swelling, and increased urination for the past 2 days.  She denies a precipitating event, recent sexual encounter or recent antibiotic use.  Currently denies vaginal discharge.  She has tried OTC medications with/ without relief.  Reports similar symptoms in the past and was diagnosed with yeast infection and treated accordingly.  She denies fever, chills, nausea, vomiting, abdominal or pelvic pain,  vaginal odor, vaginal bleeding, dyspareunia, vaginal rashes or lesions.   No LMP recorded. Patient has had an implant. Current birth control method: Compliant with BC:  ROS: As per HPI.  All other pertinent ROS negative.     Past Medical History:  Diagnosis Date  . Anxiety   . Chronic abdominal pain   . Chronic back pain   . Cirrhosis ([place])   . COPD (chronic obstructive pulmonary disease) ([place])   . Depression   . Diabetes mellitus without complication ([place])   . Hepatitis C   . Insomnia   . Long-term current use of methadone for opiate dependence ([place])   . Lupus ([place])   . Migraine headache   . Neuropathy   . Nocturnal seizures ([place])   . Peptic ulcer    Past Surgical History:  Procedure Laterality Date  . CHOLECYSTECTOMY     Allergies  Allergen Reactions  . Iodine-131 Anaphylaxis, Shortness Of Breath and Swelling  . Ivp Dye [Iodinated Diagnostic Agents] Anaphylaxis    Skin gets very red, unable to walk   . Ketorolac Tromethamine Shortness Of Breath  . Tylenol [Acetaminophen] Other (See Comments)    Due to cirrhosis   . Nsaids Other (See Comments)    Flares ulcers  . Vancomycin Swelling    Facial swelling,    No current facility-administered medications on file prior to encounter.   Current  Outpatient Medications on File Prior to Encounter  Medication Sig Dispense Refill  . albuterol (PROVENTIL) (2.5 MG/3ML) 0.083% nebulizer solution Take 2.5 mg by nebulization every 6 (six) hours as needed for wheezing or shortness of breath.    . benzonatate (TESSALON) 100 MG capsule Take 1 capsule (100 mg total) by mouth every 8 (eight) hours. (Patient not taking: Reported on 12/04/2019) 21 capsule 0  . clonazePAM (KLONOPIN) 0.5 MG tablet Take 1 tablet (0.5 mg total) by mouth 2 (two) times daily. 60 tablet 1  . etonogestrel (NEXPLANON) 68 MG IMPL implant 1 each by Subdermal route once.    . fluticasone (FLONASE) 50 MCG/ACT nasal spray Place 1 spray into both nostrils daily for 14 days. 16 g 0  . insulin NPH-regular Human (70-30) 100 UNIT/ML injection Inject 0-6 Units into the skin 3 (three) times daily. Per sliding scale  200= 2 units 250= 4 units 350= 6 units 400= Go to  ER    . insulin regular (NOVOLIN R,HUMULIN R) 100 units/mL injection Inject 10 Units into the skin 2 (two) times daily before a meal.     . metFORMIN (GLUCOPHAGE-XR) 500 MG 24 hr tablet Take 1,000 mg by mouth 2 (two) times daily.    . methadone (DOLOPHINE) 10 MG/5ML solution Take 70 mg by mouth every morning.     . pregabalin (LYRICA) 50 MG capsule Take 50 mg by mouth 3 (three) times daily.    Marland Kitchen  QUEtiapine (SEROQUEL) 25 MG tablet Take 1 tablet (25 mg total) by mouth at bedtime. 30 tablet 1  . RESTASIS 0.05 % ophthalmic emulsion Place 1 drop into both eyes 2 (two) times daily.     Marland Kitchen tetrahydrozoline 0.05 % ophthalmic solution Place 2 drops into both eyes 2 (two) times daily as needed (allergies/dry/itchy).    . venlafaxine XR (EFFEXOR-XR) 150 MG 24 hr capsule Take total of 225 mg daily (150 mg + 75 mg ) 90 capsule 0  . venlafaxine XR (EFFEXOR-XR) 75 MG 24 hr capsule Take total of 225 mg daily (150 mg + 75 mg ) 90 capsule 0    Social History   Socioeconomic History  . Marital status: Single    Spouse name: Not on file  .  Number of children: Not on file  . Years of education: Not on file  . Highest education level: Not on file  Occupational History  . Not on file  Tobacco Use  . Smoking status: Former Smoker    Packs/day: 0.00    Years: 5.00    Pack years: 0.00  . Smokeless tobacco: Never Used  Substance and Sexual Activity  . Alcohol use: No  . Drug use: No  . Sexual activity: Never  Other Topics Concern  . Not on file  Social History Narrative  . Not on file   Social Determinants of Health   Financial Resource Strain:   . Difficulty of Paying Living Expenses:   Food Insecurity:   . Worried About Charity fundraiser in the Last Year:   . Arboriculturist in the Last Year:   Transportation Needs:   . Film/video editor (Medical):   Marland Kitchen Lack of Transportation (Non-Medical):   Physical Activity:   . Days of Exercise per Week:   . Minutes of Exercise per Session:   Stress:   . Feeling of Stress :   Social Connections:   . Frequency of Communication with Friends and Family:   . Frequency of Social Gatherings with Friends and Family:   . Attends Religious Services:   . Active Member of Clubs or Organizations:   . Attends Archivist Meetings:   Marland Kitchen Marital Status:   Intimate Partner Violence:   . Fear of Current or Ex-Partner:   . Emotionally Abused:   Marland Kitchen Physically Abused:   . Sexually Abused:    Family History  Problem Relation Age of Onset  . Hypertension Mother   . Hyperlipidemia Other   . Alcohol abuse Maternal Grandfather     OBJECTIVE:  Vitals:   04/10/20 1808  BP: (!) 170/88  Pulse: (!) 110  Resp: 18  Temp: 98.8 F (37.1 C)  SpO2: 97%     General appearance: Alert, NAD, appears stated age Head: NCAT Throat: lips, mucosa, and tongue normal; teeth and gums normal Lungs: CTA bilaterally without adventitious breath sounds Heart: regular rate and rhythm.  Radial pulses 2+ symmetrical bilaterally Back: no CVA tenderness Abdomen: soft, non-tender; bowel  sounds normal; no masses or organomegaly; no guarding or rebound tenderness GU: Vaginal self swab obtained Skin: warm and dry Psychological:  Alert and cooperative. Normal mood and affect.  LABS:  Results for orders placed or performed during the hospital encounter of 04/10/20  POCT urine pregnancy  Result Value Ref Range   Preg Test, Ur Negative Negative  POCT urinalysis dipstick  Result Value Ref Range   Color, UA yellow yellow   Clarity, UA clear clear  Glucose, UA >=1,000 (A) negative mg/dL   Bilirubin, UA negative negative   Ketones, POC UA negative negative mg/dL   Spec Grav, UA 1.020 1.010 - 1.025   Blood, UA negative negative   pH, UA 7.0 5.0 - 8.0   Protein Ur, POC negative negative mg/dL   Urobilinogen, UA 1.0 0.2 or 1.0 E.U./dL   Nitrite, UA Negative Negative   Leukocytes, UA Negative Negative    Labs Reviewed  POCT URINALYSIS DIP (MANUAL ENTRY) - Abnormal; Notable for the following components:      Result Value   Glucose, UA >=1,000 (*)    All other components within normal limits  POCT URINE PREGNANCY  CERVICOVAGINAL ANCILLARY ONLY    ASSESSMENT & PLAN:  1. Vaginal irritation   2. Screening for STD (sexually transmitted disease)   3. Vaginal itching    Patient is stable for discharge.  Symptom is likely from yeast infection.  Will prescribe Diflucan.  We will follow up regarding the test result.  Was advised to follow PCP.  Meds ordered this encounter  Medications  . fluconazole (DIFLUCAN) 150 MG tablet    Sig: Take 1 tablet (150 mg total) by mouth daily. May take the second dose 72 hours after the first if symptom does not resolve    Dispense:  3 tablet    Refill:  0    Pending: Labs Reviewed  POCT URINALYSIS DIP (MANUAL ENTRY) - Abnormal; Notable for the following components:      Result Value   Glucose, UA >=1,000 (*)    All other components within normal limits  POCT URINE PREGNANCY  CERVICOVAGINAL ANCILLARY ONLY    Vaginal self-swab  obtained.  We will follow up with you regarding abnormal results Prescribed diflucan 150 mg once daily and then second dose 72 hours later Take medications as prescribed and to completion If tests results are positive, please abstain from sexual activity until you and your partner(s) have been treated Follow up with PCP or [place] if symptoms persists Return here or go to ER if you have any new or worsening symptoms fever, chills, nausea, vomiting, abdominal or pelvic pain, painful intercourse, vaginal discharge, vaginal bleeding, persistent symptoms despite treatment, etc...  Reviewed expectations re: course of current medical issues. Questions answered. Outlined signs and symptoms indicating need for more acute intervention. Patient verbalized understanding. After Visit Summary given.       Emerson Monte, FNP 04/10/20 1901

## 2020-04-10 NOTE — Discharge Instructions (Signed)
Vaginal self-swab obtained.  We will follow up with you regarding abnormal results Prescribed diflucan 150 mg once daily and then second dose 72 hours later Take medications as prescribed and to completion If tests results are positive, please abstain from sexual activity until you and your partner(s) have been treated Follow up with PCP or [place] if symptoms persists Return here or go to ER if you have any new or worsening symptoms fever, chills, nausea, vomiting, abdominal or pelvic pain, painful intercourse, vaginal discharge, vaginal bleeding, persistent symptoms despite treatment, etc..Marland Kitchen

## 2020-04-11 ENCOUNTER — Telehealth (HOSPITAL_COMMUNITY): Payer: Self-pay | Admitting: Orthopedic Surgery

## 2020-04-11 ENCOUNTER — Encounter: Payer: Self-pay | Admitting: Women's Health

## 2020-04-11 LAB — CERVICOVAGINAL ANCILLARY ONLY
Bacterial Vaginitis (gardnerella): POSITIVE — AB
Candida Glabrata: NEGATIVE
Candida Vaginitis: POSITIVE — AB
Chlamydia: NEGATIVE
Comment: NEGATIVE
Comment: NEGATIVE
Comment: NEGATIVE
Comment: NEGATIVE
Comment: NEGATIVE
Comment: NORMAL
Neisseria Gonorrhea: NEGATIVE
Trichomonas: NEGATIVE

## 2020-04-11 MED ORDER — METRONIDAZOLE 500 MG PO TABS: 500.0000 mg | ORAL_TABLET | Freq: Two times a day (BID) | ORAL | 0 refills | Status: DC
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
## 2020-04-24 ENCOUNTER — Telehealth (HOSPITAL_COMMUNITY): Payer: Self-pay | Admitting: Psychiatry

## 2020-04-24 ENCOUNTER — Encounter (HOSPITAL_COMMUNITY): Payer: Self-pay | Admitting: Psychiatry

## 2020-04-24 ENCOUNTER — Other Ambulatory Visit: Payer: Self-pay

## 2020-04-24 ENCOUNTER — Telehealth (INDEPENDENT_AMBULATORY_CARE_PROVIDER_SITE_OTHER): Payer: Medicaid Other | Admitting: Psychiatry

## 2020-04-24 DIAGNOSIS — F419 Anxiety disorder, unspecified: Secondary | ICD-10-CM | POA: Diagnosis not present

## 2020-04-24 DIAGNOSIS — F330 Major depressive disorder, recurrent, mild: Secondary | ICD-10-CM | POA: Diagnosis not present

## 2020-04-24 DIAGNOSIS — G4700 Insomnia, unspecified: Secondary | ICD-10-CM | POA: Diagnosis not present

## 2020-04-24 MED ORDER — QUETIAPINE FUMARATE 25 MG PO TABS: 25.0000 mg | ORAL_TABLET | Freq: Every day | ORAL | 0 refills | Status: DC

## 2020-04-24 MED ORDER — CLONAZEPAM 0.5 MG PO TABS: 0.5000 mg | ORAL_TABLET | Freq: Two times a day (BID) | ORAL | 2 refills | Status: DC

## 2020-04-24 MED ORDER — VENLAFAXINE HCL ER 150 MG PO CP24: ORAL_CAPSULE | ORAL | 0 refills | Status: DC

## 2020-04-24 MED ORDER — VENLAFAXINE HCL ER 75 MG PO CP24: ORAL_CAPSULE | ORAL | 0 refills | Status: DC

## 2020-04-24 NOTE — Telephone Encounter (Signed)
Sent link for video visit through Epic. Patient did not sign in. Called the patient  for appointment scheduled today. The patient did not answer the phone. Left voice message to contact the office.  

## 2020-05-09 ENCOUNTER — Encounter: Payer: Self-pay | Admitting: Advanced Practice Midwife

## 2020-05-09 ENCOUNTER — Other Ambulatory Visit (HOSPITAL_COMMUNITY)
Admission: RE | Admit: 2020-05-09 | Discharge: 2020-05-09 | Disposition: A | Discharge status: Home / Self Care | Payer: Medicaid Other | Source: Ambulatory Visit | Attending: Advanced Practice Midwife | Admitting: Advanced Practice Midwife

## 2020-05-09 ENCOUNTER — Ambulatory Visit: Payer: Medicaid Other | Admitting: Advanced Practice Midwife

## 2020-05-09 VITALS — BP 143/82 | HR 113 | Ht 69.0 in | Wt 292.0 lb

## 2020-05-09 DIAGNOSIS — Z978 Presence of other specified devices: Secondary | ICD-10-CM

## 2020-05-09 DIAGNOSIS — Z01419 Encounter for gynecological examination (general) (routine) without abnormal findings: Secondary | ICD-10-CM

## 2020-05-09 DIAGNOSIS — Z0000 Encounter for general adult medical examination without abnormal findings: Secondary | ICD-10-CM

## 2020-05-09 NOTE — Progress Notes (Signed)
Jaime [name] 34 y.o.  Vitals:   05/09/20 0844  BP: (!) 143/82  Pulse: (!) 113     Filed Weights   05/09/20 0844  Weight: 292 lb (132.5 kg)    Past Medical History: Past Medical History:  Diagnosis Date  . Anxiety   . Asthma   . Chronic abdominal pain   . Chronic back pain   . Cirrhosis ([place])   . Cirrhosis ([place])   . COPD (chronic obstructive pulmonary disease) ([place])   . Depression   . Diabetes mellitus without complication ([place])   . Hepatitis C   . Insomnia   . Long-term current use of methadone for opiate dependence ([place])   . Lupus ([place])   . Migraine headache   . Neuropathy   . Nocturnal seizures ([place])   . Peptic ulcer   . Spleen enlarged     Past Surgical History: Past Surgical History:  Procedure Laterality Date  . CHOLECYSTECTOMY      Family History: Family History  Problem Relation Age of Onset  . Hypertension Mother   . Hyperlipidemia Maternal Grandfather     Social History: Social History   Tobacco Use  . Smoking status: Current Every Day Smoker    Packs/day: 0.00    Years: 5.00    Pack years: 0.00    Types: E-cigarettes  . Smokeless tobacco: Never Used  Vaping Use  . Vaping Use: Every day  . Substances: Nicotine, Flavoring  Substance Use Topics  . Alcohol use: No  . Drug use: No    Allergies:  Allergies  Allergen Reactions  . Iodine-131 Anaphylaxis, Shortness Of Breath and Swelling  . Ivp Dye [Iodinated Diagnostic Agents] Anaphylaxis    Skin gets very red, unable to walk   . Ketorolac Tromethamine Shortness Of Breath  . Tylenol [Acetaminophen] Other (See Comments)    Due to cirrhosis   . Nsaids Other (See Comments)    Flares ulcers  . Vancomycin Swelling    Facial swelling,       Current Outpatient Medications:  .  albuterol (PROVENTIL) (2.5 MG/3ML) 0.083% nebulizer solution, Take 2.5 mg by nebulization every 6 (six) hours as needed for wheezing or shortness of breath., Disp: , Rfl:  .  aspirin EC 81 MG tablet, Take  162 mg by mouth daily. Swallow whole., Disp: , Rfl:  .  clonazePAM (KLONOPIN) 0.5 MG tablet, Take 1 tablet (0.5 mg total) by mouth 2 (two) times daily., Disp: 60 tablet, Rfl: 2 .  etonogestrel (NEXPLANON) 68 MG IMPL implant, 1 each by Subdermal route once., Disp: , Rfl:  .  insulin NPH-regular Human (70-30) 100 UNIT/ML injection, Inject 30 Units into the skin in the morning and at bedtime. Per sliding scale  200= 2 units 250= 4 units 350= 6 units 400= Go to  ER, Disp: , Rfl:  .  insulin regular (NOVOLIN R,HUMULIN R) 100 units/mL injection, Inject 10 Units into the skin 3 (three) times daily before meals. , Disp: , Rfl:  .  metFORMIN (GLUCOPHAGE-XR) 500 MG 24 hr tablet, Take 1,000 mg by mouth 2 (two) times daily., Disp: , Rfl:  .  methadone (DOLOPHINE) 10 MG/5ML solution, Take 45 mg by mouth every morning. , Disp: , Rfl:  .  pregabalin (LYRICA) 50 MG capsule, Take 50 mg by mouth 3 (three) times daily., Disp: , Rfl:  .  QUEtiapine (SEROQUEL) 25 MG tablet, Take 1 tablet (25 mg total) by mouth at bedtime., Disp: 90 tablet, Rfl: 0 .  RESTASIS 0.05 % ophthalmic emulsion, Place 1 drop into both eyes as needed. , Disp: , Rfl:  .  tetrahydrozoline 0.05 % ophthalmic solution, Place 2 drops into both eyes 2 (two) times daily as needed (allergies/dry/itchy)., Disp: , Rfl:  .  venlafaxine XR (EFFEXOR-XR) 150 MG 24 hr capsule, Take total of 225 mg daily (150 mg + 75 mg ), Disp: 90 capsule, Rfl: 0 .  venlafaxine XR (EFFEXOR-XR) 75 MG 24 hr capsule, Take total of 225 mg daily (150 mg + 75 mg ), Disp: 90 capsule, Rfl: 0 .  fluticasone (FLONASE) 50 MCG/ACT nasal spray, Place 1 spray into both nostrils daily for 14 days., Disp: 16 g, Rfl: 0  History of Present Illness: Here for pap only.  PCP does physical and manages chronic diseases.  Last pap 2015, normal results. Has had nexplanon since 2015.  Still hasn't resumed periods, not sexually active. Has appt w/endocrinologist for diabetes mgt.  Review of Systems    Patient denies any headaches, blurred vision, shortness of breath, chest pain, abdominal pain, problems with bowel movements, urination.  Not sexually active.   Physical Exam: General:  Well developed, morbidly obese, no acute distress Skin:  Warm and dry Neck:  Midline trachea Lungs; normal respiratory effort Heart:  RR&R Breast:  No dominant palpable mass, retraction, or nipple discharge Cardiovascular: Regular rate and rhythm Abdomen:  Soft, nontender. Diastasis recti Pelvic:  External genitalia is normal in appearance.  The vagina is normal in appearance.  The cervix is nulliparous.  Uterus is felt to be normal size, shape, and contour.  No adnexal masses or tenderness noted. Exam limited by habitus Psych:  Sees psychiatrist. Started anxiety meds recently, thinking it's helping. Discussed increasing frequency of visits if she feels like its needed.    Impression: normal GYN exam  The above by Catie Conley Canal, NP-S under my direct supervision    Plan: if pap normal, repeat q 3-5 years F/U for nexplanon removal and reinsertion

## 2020-05-13 LAB — CYTOLOGY - PAP
Adequacy: ABSENT
Comment: NEGATIVE
Diagnosis: NEGATIVE
High risk HPV: NEGATIVE

## 2020-05-20 ENCOUNTER — Encounter: Payer: Self-pay | Admitting: Adult Health

## 2020-05-20 ENCOUNTER — Ambulatory Visit: Payer: Medicaid Other | Admitting: Adult Health

## 2020-05-20 VITALS — BP 144/88 | HR 99 | Ht 69.0 in | Wt 300.0 lb

## 2020-05-20 DIAGNOSIS — Z3046 Encounter for surveillance of implantable subdermal contraceptive: Secondary | ICD-10-CM | POA: Insufficient documentation

## 2020-05-20 DIAGNOSIS — Z30017 Encounter for initial prescription of implantable subdermal contraceptive: Secondary | ICD-10-CM | POA: Insufficient documentation

## 2020-05-20 LAB — POCT URINE PREGNANCY: Preg Test, Ur: NEGATIVE

## 2020-05-20 MED ORDER — ETONOGESTREL 68 MG ~~LOC~~ IMPL
68.0000 mg | DRUG_IMPLANT | Freq: Once | SUBCUTANEOUS | Status: AC
  Administered 2020-05-20: 68 mg via SUBCUTANEOUS

## 2020-05-20 NOTE — Patient Instructions (Signed)
Use condoms x 2 weeks, keep clean and dry x 24 hours, no heavy lifting, keep steri strips on x 72 hours, Keep pressure dressing on x 24 hours. Follow up prn problems.

## 2020-05-20 NOTE — Progress Notes (Signed)
  Subjective:     Patient ID: Jaime [name], female   DOB: [birth year], 34 y.o.   MRN: [id number]  HPI Jaime [name] is a 34 year old white female, single, G0P0, in for removal and reinsertion of nexplanon.  Had negative pap 05/09/20.  PCP is Dr Legrand Rams.  Review of Systems No periods on nexplanon Reviewed past medical,surgical, social and family history. Reviewed medications and allergies.     Objective:   Physical Exam BP (!) 144/88 (BP Location: Left Arm, Patient Position: Sitting, Cuff Size: Normal)   Pulse 99   Ht 5' 9"  (1.753 m)   Wt 300 lb (136.1 kg)   BMI 44.30 kg/m    UPT is negative, consent signed, time out called. Left arm cleansed with betadine, and injected with 1.5 cc 2% lidocaine and waited til numb.Under sterile technique a #11 blade was used to make small vertical incision, and a curved forceps was used to easily remove rod. New rod easily inserted and palpated by provider and pt. Steri strips applied. Pressure dressing applied.  Upstream - 05/20/20 0948      Pregnancy Intention Screening   Does the patient want to become pregnant in the next year? No    Does the patient's partner want to become pregnant in the next year? N/A    Would the patient like to discuss contraceptive options today? No      Contraception Wrap Up   Current Method Hormonal Implant    End Method Hormonal Implant    Contraception Counseling Provided No          Assessment:     1. Nexplanon insertion Lot [id number]  2. Nexplanon removal     Plan:    Use condoms x 2 weeks, keep clean and dry x 24 hours, no heavy lifting, keep steri strips on x 72 hours, Keep pressure dressing on x 24 hours. Follow up prn problems.   Remove in 3 years or sooner if desired.

## 2020-05-24 DIAGNOSIS — E1165 Type 2 diabetes mellitus with hyperglycemia: Secondary | ICD-10-CM | POA: Insufficient documentation

## 2020-05-24 DIAGNOSIS — R601 Generalized edema: Secondary | ICD-10-CM | POA: Insufficient documentation

## 2020-05-24 HISTORY — DX: Type 2 diabetes mellitus with hyperglycemia: E11.65

## 2020-05-24 HISTORY — DX: Morbid (severe) obesity due to excess calories: E66.01

## 2020-05-28 ENCOUNTER — Ambulatory Visit (INDEPENDENT_AMBULATORY_CARE_PROVIDER_SITE_OTHER): Payer: Medicaid Other | Admitting: "Endocrinology

## 2020-05-28 ENCOUNTER — Other Ambulatory Visit: Payer: Self-pay

## 2020-05-28 ENCOUNTER — Encounter: Payer: Self-pay | Admitting: "Endocrinology

## 2020-05-28 VITALS — BP 140/74 | HR 124 | Ht 69.0 in | Wt 270.2 lb

## 2020-05-28 DIAGNOSIS — E1165 Type 2 diabetes mellitus with hyperglycemia: Secondary | ICD-10-CM

## 2020-05-28 LAB — POCT GLYCOSYLATED HEMOGLOBIN (HGB A1C): Hemoglobin A1C: 8.6 % — AB (ref 4.0–5.6)

## 2020-05-28 MED ORDER — METFORMIN HCL ER 500 MG PO TB24: 500.0000 mg | ORAL_TABLET | Freq: Two times a day (BID) | ORAL | 1 refills | Status: DC

## 2020-05-28 NOTE — Progress Notes (Signed)
Endocrinology Consult Note       05/29/2020, 12:43 PM   Subjective:    Patient ID: Jaime [name], female    DOB: [birth year].  Jaime [name] is being seen in consultation for management of currently uncontrolled symptomatic diabetes requested by  Rosita Fire, MD.   Past Medical History:  Diagnosis Date  . Anxiety   . Asthma   . Chronic abdominal pain   . Chronic back pain   . Cirrhosis ([place])   . Cirrhosis ([place])   . COPD (chronic obstructive pulmonary disease) ([place])   . Depression   . Diabetes mellitus without complication ([place])   . Diabetes mellitus, type II ([place])   . Hepatitis C   . Insomnia   . Long-term current use of methadone for opiate dependence ([place])   . Lupus ([place])   . Migraine headache   . Neuropathy   . Nocturnal seizures ([place])   . Peptic ulcer   . Spleen enlarged     Past Surgical History:  Procedure Laterality Date  . CHOLECYSTECTOMY      Social History   Socioeconomic History  . Marital status: Single    Spouse name: Not on file  . Number of children: Not on file  . Years of education: Not on file  . Highest education level: Not on file  Occupational History  . Not on file  Tobacco Use  . Smoking status: Current Every Day Smoker    Packs/day: 0.00    Years: 5.00    Pack years: 0.00    Types: E-cigarettes  . Smokeless tobacco: Never Used  Vaping Use  . Vaping Use: Every day  . Substances: Nicotine, Flavoring  Substance and Sexual Activity  . Alcohol use: No  . Drug use: No  . Sexual activity: Not Currently    Birth control/protection: Implant  Other Topics Concern  . Not on file  Social History Narrative  . Not on file   Social Determinants of Health   Financial Resource Strain: Medium Risk  . Difficulty of Paying Living Expenses: Somewhat hard  Food Insecurity: No Food Insecurity  . Worried About Charity fundraiser in the Last Year: Never true   . Ran Out of Food in the Last Year: Never true  Transportation Needs: No Transportation Needs  . Lack of Transportation (Medical): No  . Lack of Transportation (Non-Medical): No  Physical Activity: Insufficiently Active  . Days of Exercise per Week: 1 day  . Minutes of Exercise per Session: 20 min  Stress: Stress Concern Present  . Feeling of Stress : Very much  Social Connections: Moderately Isolated  . Frequency of Communication with Friends and Family: More than three times a week  . Frequency of Social Gatherings with Friends and Family: Twice a week  . Attends Religious Services: More than 4 times per year  . Active Member of Clubs or Organizations: No  . Attends Archivist Meetings: Never  . Marital Status: Never married    Family History  Problem Relation Age of Onset  . Hypertension Mother   . Hyperlipidemia Mother   . Hyperlipidemia Maternal  Grandfather     Outpatient Encounter Medications as of 05/28/2020  Medication Sig  . cyclobenzaprine (FLEXERIL) 5 MG tablet Take 1 tablet by mouth 2 (two) times daily as needed.  . diclofenac Sodium (VOLTAREN) 1 % GEL Apply topically.  . furosemide (LASIX) 40 MG tablet Take 1 tablet by mouth daily.  . ondansetron (ZOFRAN-ODT) 4 MG disintegrating tablet Take 1 tablet by mouth every 8 (eight) hours.  . potassium chloride (KLOR-CON) 10 MEQ tablet Take 1 tablet by mouth daily.  . pregabalin (LYRICA) 75 MG capsule Take 75 mg by mouth 2 (two) times daily.  Marland Kitchen spironolactone (ALDACTONE) 100 MG tablet Take 1 tablet by mouth daily.  Marland Kitchen torsemide (DEMADEX) 20 MG tablet Take 2 tablets by mouth daily.  . [DISCONTINUED] insulin aspart (NOVOLOG FLEXPEN) 100 UNIT/ML FlexPen Inject 10 Units into the skin 3 (three) times daily.  Marland Kitchen albuterol (PROVENTIL) (2.5 MG/3ML) 0.083% nebulizer solution Take 2.5 mg by nebulization every 6 (six) hours as needed for wheezing or shortness of breath.  Marland Kitchen aspirin EC 81 MG tablet Take 162 mg by mouth daily.  Swallow whole.  . clonazePAM (KLONOPIN) 0.5 MG tablet Take 1 tablet (0.5 mg total) by mouth 2 (two) times daily.  Marland Kitchen etonogestrel (NEXPLANON) 68 MG IMPL implant 1 each by Subdermal route once.  . fluticasone (FLONASE) 50 MCG/ACT nasal spray Place 1 spray into both nostrils daily for 14 days.  . insulin NPH-regular Human (70-30) 100 UNIT/ML injection Inject 40 Units into the skin 2 (two) times daily before a meal.  . metFORMIN (GLUCOPHAGE-XR) 500 MG 24 hr tablet Take 1 tablet (500 mg total) by mouth 2 (two) times daily after a meal.  . methadone (DOLOPHINE) 10 MG/5ML solution Take 45 mg by mouth every morning.   Marland Kitchen QUEtiapine (SEROQUEL) 25 MG tablet Take 1 tablet (25 mg total) by mouth at bedtime.  . RESTASIS 0.05 % ophthalmic emulsion Place 1 drop into both eyes as needed.   Marland Kitchen tetrahydrozoline 0.05 % ophthalmic solution Place 2 drops into both eyes 2 (two) times daily as needed (allergies/dry/itchy).  . venlafaxine XR (EFFEXOR-XR) 150 MG 24 hr capsule Take total of 225 mg daily (150 mg + 75 mg )  . [DISCONTINUED] insulin regular (NOVOLIN R,HUMULIN R) 100 units/mL injection Inject 10 Units into the skin 3 (three) times daily before meals.   . [DISCONTINUED] metFORMIN (GLUCOPHAGE-XR) 500 MG 24 hr tablet Take 500 mg by mouth 2 (two) times daily.  . [DISCONTINUED] pregabalin (LYRICA) 50 MG capsule Take 50 mg by mouth 3 (three) times daily.  . [DISCONTINUED] venlafaxine XR (EFFEXOR-XR) 75 MG 24 hr capsule Take total of 225 mg daily (150 mg + 75 mg )  . [DISCONTINUED] albuterol (PROVENTIL) (2.5 MG/3ML) 0.083% nebulizer solution   . [DISCONTINUED] Carboxymethylcellulose Sodium 0.25 % SOLN   . [DISCONTINUED] clonazePAM (KLONOPIN) tablet   . [DISCONTINUED] cyclobenzaprine (FLEXERIL) tablet   . [DISCONTINUED] dextrose (GLUTOSE) 40 % oral gel   . [DISCONTINUED] dextrose (GLUTOSE) 40 % oral gel   . [DISCONTINUED] dextrose 50 % solution   . [DISCONTINUED] dextrose 50 % solution   . [DISCONTINUED]  diphenhydrAMINE (BENADRYL) capsule   . [DISCONTINUED] enoxaparin (LOVENOX) injection   . [DISCONTINUED] furosemide (LASIX) injection   . [DISCONTINUED] glucagon injection   . [DISCONTINUED] insulin glargine (LANTUS) injection   . [DISCONTINUED] insulin lispro (HUMALOG) injection   . [DISCONTINUED] insulin lispro (HUMALOG) injection   . [DISCONTINUED] methadone (DOLOPHINE) tablet   . [DISCONTINUED] ondansetron (ZOFRAN-ODT) disintegrating tablet   . [DISCONTINUED]  potassium chloride SA (KLOR-CON) CR tablet   . [DISCONTINUED] venlafaxine XR (EFFEXOR-XR) 24 hr capsule    No facility-administered encounter medications on file as of 05/28/2020.    ALLERGIES: Allergies  Allergen Reactions  . Iodine-131 Anaphylaxis, Shortness Of Breath and Swelling  . Ivp Dye [Iodinated Diagnostic Agents] Anaphylaxis    Skin gets very red, unable to walk   . Ketorolac Tromethamine Shortness Of Breath  . Tylenol [Acetaminophen] Other (See Comments)    Due to cirrhosis   . Gabapentin   . Morphine And Related   . Nsaids Other (See Comments)    Flares ulcers  . Suboxone [Buprenorphine Hcl-Naloxone Hcl]   . Vancomycin Swelling    Facial swelling,     VACCINATION STATUS: Immunization History  Administered Date(s) Administered  . Influenza,inj,Quad PF,6+ Mos 07/18/2016  . Moderna SARS-COVID-2 Vaccination 03/07/2020, 04/04/2020  . Pneumococcal Polysaccharide-23 07/18/2016    Diabetes She presents for her initial diabetic visit. She has type 2 diabetes mellitus. Onset time: She was diagnosed at approximate age of 63 years. Her disease course has been fluctuating. There are no hypoglycemic associated symptoms. Pertinent negatives for hypoglycemia include no confusion, headaches, pallor or seizures. Associated symptoms include fatigue, polydipsia and polyuria. Pertinent negatives for diabetes include no chest pain and no polyphagia. There are no hypoglycemic complications. Symptoms are worsening. Risk factors  for coronary artery disease include diabetes mellitus, obesity and sedentary lifestyle. Current diabetic treatments: She is currently on Novolin 70/30 30 units twice daily, NovoLog 10 units 3 times daily AC.Marland Kitchen She is also taking Metformin 1000 mg ER 2 times a day. Her weight is fluctuating minimally. She is following a generally unhealthy diet. When asked about meal planning, she reported none. She has not had a previous visit with a dietitian. (She did not bring any logs nor meter with her.  She reports that yesterday she had hypoglycemia 600, lower to 189 this morning.  Her point-of-care A1c is 8.6% today, improving from 10% in March 2021.) An ACE inhibitor/angiotensin II receptor blocker is not being taken. Eye exam is not current.     Review of Systems  Constitutional: Positive for fatigue. Negative for chills, fever and unexpected weight change.  HENT: Negative for trouble swallowing and voice change.   Eyes: Negative for visual disturbance.  Respiratory: Negative for cough, shortness of breath and wheezing.   Cardiovascular: Negative for chest pain, palpitations and leg swelling.  Gastrointestinal: Negative for diarrhea, nausea and vomiting.  Endocrine: Positive for polydipsia and polyuria. Negative for cold intolerance, heat intolerance and polyphagia.  Musculoskeletal: Negative for arthralgias and myalgias.  Skin: Negative for color change, pallor, rash and wound.  Neurological: Negative for seizures and headaches.  Psychiatric/Behavioral: Negative for confusion and suicidal ideas.    Objective:    Vitals with BMI 05/28/2020 05/20/2020 05/09/2020  Height 5' 9"  5' 9"  5' 9"   Weight 270 lbs 3 oz 300 lbs 292 lbs  BMI 39.88 12.45 80.9  Systolic 983 382 505  Diastolic 74 88 82  Pulse 397 99 113    BP (!) 140/74   Pulse (!) 124   Ht 5' 9"  (1.753 m)   Wt (!) 270 lb 3.2 oz (122.6 kg)   BMI 39.90 kg/m   Wt Readings from Last 3 Encounters:  05/28/20 (!) 270 lb 3.2 oz (122.6 kg)  05/20/20  300 lb (136.1 kg)  05/09/20 292 lb (132.5 kg)     Physical Exam Constitutional:      Appearance: She is well-developed.  HENT:     Head: Normocephalic and atraumatic.  Neck:     Thyroid: No thyromegaly.     Trachea: No tracheal deviation.  Cardiovascular:     Rate and Rhythm: Normal rate and regular rhythm.  Pulmonary:     Effort: Pulmonary effort is normal.  Abdominal:     Tenderness: There is no abdominal tenderness. There is no guarding.  Musculoskeletal:        General: Normal range of motion.     Cervical back: Normal range of motion and neck supple.  Skin:    General: Skin is warm and dry.     Coloration: Skin is not pale.     Findings: No erythema or rash.  Neurological:     Mental Status: She is alert and oriented to person, place, and time.     Cranial Nerves: No cranial nerve deficit.     Coordination: Coordination normal.     Deep Tendon Reflexes: Reflexes are normal and symmetric.  Psychiatric:        Judgment: Judgment normal.       CMP ( most recent) CMP     Component Value Date/Time   NA 134 (L) 12/04/2019 1130   K 3.7 12/04/2019 1130   CL 99 12/04/2019 1130   CO2 24 12/04/2019 1130   GLUCOSE 312 (H) 12/04/2019 1130   BUN 8 12/06/2019 0000   CREATININE 0.6 12/06/2019 0000   CREATININE 0.68 12/04/2019 1130   CALCIUM 9.0 12/04/2019 1130   PROT 7.4 12/04/2019 1130   ALBUMIN 3.8 12/04/2019 1130   AST 42 (H) 12/04/2019 1130   ALT 39 12/04/2019 1130   ALKPHOS 102 12/04/2019 1130   BILITOT 1.1 12/04/2019 1130   GFRNONAA >60 12/04/2019 1130   GFRAA >60 12/04/2019 1130     Diabetic Labs (most recent): Lab Results  Component Value Date   HGBA1C 8.6 (A) 05/28/2020   HGBA1C 10.4 12/06/2019     Lipid Panel ( most recent) Lipid Panel     Component Value Date/Time   CHOL 197 12/06/2019 0000   TRIG 134 12/06/2019 0000   HDL 39 12/06/2019 0000   LDLCALC 133 12/06/2019 0000      Lab Results  Component Value Date   TSH 1.690 08/22/2010    FREET4 1.11 08/22/2010      Assessment & Plan:   1. Type 2 diabetes mellitus with hyperglycemia, without long-term current use of insulin (HCC)   - Jaime [name] has currently uncontrolled symptomatic type 2 DM since  34 years of age,  with most recent A1c of 8.6 %. Recent labs reviewed.  She had A1c as high as 10.4% in February 2021. - I had a long discussion with her about the progressive nature of diabetes and the pathology behind its complications. -her diabetes is complicated by obesity/sedentary life and she remains at a high risk for more acute and chronic complications which include CAD, CVA, CKD, retinopathy, and neuropathy. These are all discussed in detail with her.  - I have counseled her on diet  and weight management  by adopting a carbohydrate restricted/protein rich diet. Patient is encouraged to switch to  unprocessed or minimally processed     complex starch and increased protein intake (animal or plant source), fruits, and vegetables. -  she is advised to stick to a routine mealtimes to eat 3 meals  a day and avoid unnecessary snacks ( to snack only to correct hypoglycemia).   - she admits that there is  a room for improvement in her food and drink choices. - Suggestion is made for her to avoid simple carbohydrates  from her diet including Cakes, Sweet Desserts, Ice Cream, Soda (diet and regular), Sweet Tea, Candies, Chips, Cookies, Store Bought Juices, Alcohol in Excess of  1-2 drinks a day, Artificial Sweeteners,  Coffee Creamer, and "Sugar-free" Products. This will help patient to have more stable blood glucose profile and potentially avoid unintended weight gain.  - she will be scheduled with Jearld Fenton, RDN, CDE for diabetes education.  - I have approached her with the following individualized plan to manage  her diabetes and patient agrees:   - she is on complicated insulin regimen , will benefit from simplified treatment approach.   -I advised her to stop  NovoLog for now.  She is advised to increase her Novolin 70/30 to 40 units twice a day with breakfast and with supper when [place] blood glucose readings above 90 mg per DL, associated with strict monitoring of glucose 4 times a day-before meals and at bedtime. - she is warned not to take insulin without proper monitoring per orders. - Adjustment parameters are given to her for hypo and hyperglycemia in writing. - she is encouraged to call clinic for blood glucose levels less than 70 or above 300 mg /dl. - she is advised to lower her Metformin to 500 mg ER p.o. twice daily with breakfast and supper.    - she will be considered for incretin therapy as appropriate next visit.  - Specific targets for  A1c;  LDL, HDL,  and Triglycerides were discussed with the patient.  2) Blood Pressure /Hypertension:  her blood pressure is not controlled to target.  She will be considered for low-dose lisinopril with subsequent visits.    3) Lipids/Hyperlipidemia:   Review of her recent lipid panel showed  uncontrolled  LDL at 133 .  she  is not on statins, will be considered for low-dose statins next visit.     4)  Weight/Diet:  Body mass index is 39.9 kg/m.  -   clearly complicating her diabetes care.   she is  a candidate for modest weight loss. I discussed with her the fact that loss of 5 - 10% of her  current body weight will have the most impact on her diabetes management.  Exercise, and detailed carbohydrates information provided  -  detailed on discharge instructions.  5) Chronic Care/Health Maintenance:  -she  Is not on ACEI/ARB and Statin medications and  is encouraged to initiate and continue to follow up with Ophthalmology, Dentist,  Podiatrist at least yearly or according to recommendations, and advised to   stay away from smoking. I have recommended yearly flu vaccine and pneumonia vaccine at least every 5 years; moderate intensity exercise for up to 150 minutes weekly; and  sleep for at least 7 hours  a day.  - she is  advised to maintain close follow up with Rosita Fire, MD for primary care needs, as well as her other providers for optimal and coordinated care.   - Time spent in this patient care: 60 min, of which > 50% was spent in  counseling  her about her currently uncontrolled type 2 diabetes, hyperlipidemia, obesity/sedentary life and the rest reviewing her blood glucose logs , discussing her hypoglycemia and hyperglycemia episodes, reviewing her current and  previous labs / studies  ( including abstraction from other facilities) and medications  doses and developing a  long term treatment plan based  on the latest standards of care/ guidelines; and documenting her care.    Please refer to Patient Instructions for Blood Glucose Monitoring and Insulin/Medications Dosing Guide"  in media tab for additional information. Please  also refer to " Patient Self Inventory" in the Media  tab for reviewed elements of pertinent patient history.  Jaime [name] participated in the discussions, expressed understanding, and voiced agreement with the above plans.  All questions were answered to her satisfaction. she is encouraged to contact clinic should she have any questions or concerns prior to her return visit.   Follow up plan: - Return in about 10 days (around 06/07/2020) for F/U with Meter and Logs Only - no Labs.  Glade Lloyd, MD [facility] [address] [place], [place] [address] Phone: [phone]  Fax: [phone]    05/29/2020, 12:43 PM  This note was partially dictated with voice recognition software. Similar sounding words can be transcribed inadequately or may not  be corrected upon review.

## 2020-05-28 NOTE — Patient Instructions (Signed)

## 2020-05-29 ENCOUNTER — Telehealth: Payer: Self-pay | Admitting: "Endocrinology

## 2020-05-29 NOTE — Telephone Encounter (Signed)
Advise her to increase Novolin 70/30 to 60 units with breakfast and 60 units with supper when blood glucose readings are above 90 mg/day.

## 2020-05-29 NOTE — Telephone Encounter (Signed)
Discussed with pt, understanding voiced. 

## 2020-05-29 NOTE — Telephone Encounter (Signed)
Readings:  7/27 at 9:42pm 472 7/28 at 6:42 am 360 and 2:13pm 426

## 2020-06-02 HISTORY — PX: ESOPHAGOGASTRODUODENOSCOPY: SHX1529

## 2020-06-04 ENCOUNTER — Ambulatory Visit: Payer: Medicaid Other | Admitting: "Endocrinology

## 2020-06-06 ENCOUNTER — Other Ambulatory Visit: Payer: Self-pay

## 2020-06-06 ENCOUNTER — Encounter: Payer: Self-pay | Admitting: Nurse Practitioner

## 2020-06-06 ENCOUNTER — Ambulatory Visit (INDEPENDENT_AMBULATORY_CARE_PROVIDER_SITE_OTHER): Payer: Medicaid Other | Admitting: Nurse Practitioner

## 2020-06-06 VITALS — BP 146/92 | HR 102 | Ht 69.0 in | Wt 282.2 lb

## 2020-06-06 DIAGNOSIS — E1165 Type 2 diabetes mellitus with hyperglycemia: Secondary | ICD-10-CM | POA: Diagnosis not present

## 2020-06-06 DIAGNOSIS — E782 Mixed hyperlipidemia: Secondary | ICD-10-CM | POA: Diagnosis not present

## 2020-06-06 MED ORDER — INSULIN NPH ISOPHANE & REGULAR (70-30) 100 UNIT/ML ~~LOC~~ SUSP: 80.0000 [IU] | Freq: Two times a day (BID) | SUBCUTANEOUS | 2 refills | Status: DC

## 2020-06-06 MED ORDER — ACCU-CHEK GUIDE VI STRP: ORAL_STRIP | 2 refills | Status: DC

## 2020-06-06 MED ORDER — LIRAGLUTIDE 18 MG/3ML ~~LOC~~ SOPN
1.2000 mg | PEN_INJECTOR | Freq: Every day | SUBCUTANEOUS | 3 refills | Status: DC
Start: 2020-06-06 — End: 2020-07-10

## 2020-06-06 MED ORDER — LISINOPRIL 5 MG PO TABS
5.0000 mg | ORAL_TABLET | Freq: Every day | ORAL | 3 refills | Status: DC
Start: 2020-06-06 — End: 2021-06-30

## 2020-06-06 NOTE — Patient Instructions (Signed)

## 2020-06-06 NOTE — Progress Notes (Signed)
Endocrinology Consult Note       06/06/2020, 4:18 PM   Subjective:    Patient ID: Jaime [name], female    DOB: [birth year].  ERLINE SIDDOWAY is being seen in consultation for management of currently uncontrolled symptomatic diabetes requested by  Rosita Fire, MD.   Past Medical History:  Diagnosis Date  . Anxiety   . Asthma   . Chronic abdominal pain   . Chronic back pain   . Cirrhosis ([place])   . Cirrhosis ([place])   . COPD (chronic obstructive pulmonary disease) ([place])   . Depression   . Diabetes mellitus without complication ([place])   . Diabetes mellitus, type II ([place])   . Hepatitis C   . Insomnia   . Long-term current use of methadone for opiate dependence ([place])   . Lupus ([place])   . Migraine headache   . Neuropathy   . Nocturnal seizures ([place])   . Peptic ulcer   . Spleen enlarged     Past Surgical History:  Procedure Laterality Date  . CHOLECYSTECTOMY      Social History   Socioeconomic History  . Marital status: Single    Spouse name: Not on file  . Number of children: Not on file  . Years of education: Not on file  . Highest education level: Not on file  Occupational History  . Not on file  Tobacco Use  . Smoking status: Current Every Day Smoker    Packs/day: 0.00    Years: 5.00    Pack years: 0.00    Types: E-cigarettes  . Smokeless tobacco: Never Used  Vaping Use  . Vaping Use: Every day  . Substances: Nicotine, Flavoring  Substance and Sexual Activity  . Alcohol use: No  . Drug use: No  . Sexual activity: Not Currently    Birth control/protection: Implant  Other Topics Concern  . Not on file  Social History Narrative  . Not on file   Social Determinants of Health   Financial Resource Strain: Medium Risk  . Difficulty of Paying Living Expenses: Somewhat hard  Food Insecurity: No Food Insecurity  . Worried About Charity fundraiser in the Last Year: Never true   . Ran Out of Food in the Last Year: Never true  Transportation Needs: No Transportation Needs  . Lack of Transportation (Medical): No  . Lack of Transportation (Non-Medical): No  Physical Activity: Insufficiently Active  . Days of Exercise per Week: 1 day  . Minutes of Exercise per Session: 20 min  Stress: Stress Concern Present  . Feeling of Stress : Very much  Social Connections: Moderately Isolated  . Frequency of Communication with Friends and Family: More than three times a week  . Frequency of Social Gatherings with Friends and Family: Twice a week  . Attends Religious Services: More than 4 times per year  . Active Member of Clubs or Organizations: No  . Attends Archivist Meetings: Never  . Marital Status: Never married    Family History  Problem Relation Age of Onset  . Hypertension Mother   . Hyperlipidemia Mother   . Hyperlipidemia Maternal  Grandfather     Outpatient Encounter Medications as of 06/06/2020  Medication Sig  . albuterol (PROVENTIL) (2.5 MG/3ML) 0.083% nebulizer solution Take 2.5 mg by nebulization every 6 (six) hours as needed for wheezing or shortness of breath.  Marland Kitchen aspirin EC 81 MG tablet Take 162 mg by mouth daily. Swallow whole.  . clonazePAM (KLONOPIN) 0.5 MG tablet Take 1 tablet (0.5 mg total) by mouth 2 (two) times daily.  . cyclobenzaprine (FLEXERIL) 5 MG tablet Take 1 tablet by mouth 2 (two) times daily as needed.  . diclofenac Sodium (VOLTAREN) 1 % GEL Apply topically.  Marland Kitchen etonogestrel (NEXPLANON) 68 MG IMPL implant 1 each by Subdermal route once.  . furosemide (LASIX) 40 MG tablet Take 1 tablet by mouth daily.  . insulin NPH-regular Human (70-30) 100 UNIT/ML injection Inject 80 Units into the skin 2 (two) times daily before a meal.  . metFORMIN (GLUCOPHAGE-XR) 500 MG 24 hr tablet Take 1 tablet (500 mg total) by mouth 2 (two) times daily after a meal.  . methadone (DOLOPHINE) 10 MG/5ML solution Take 45 mg by mouth every morning.   .  potassium chloride (KLOR-CON) 10 MEQ tablet Take 1 tablet by mouth daily.  . pregabalin (LYRICA) 75 MG capsule Take 75 mg by mouth 2 (two) times daily.  . QUEtiapine (SEROQUEL) 25 MG tablet Take 1 tablet (25 mg total) by mouth at bedtime.  . RESTASIS 0.05 % ophthalmic emulsion Place 1 drop into both eyes as needed.   Marland Kitchen spironolactone (ALDACTONE) 100 MG tablet Take 1 tablet by mouth daily.  Marland Kitchen tetrahydrozoline 0.05 % ophthalmic solution Place 2 drops into both eyes 2 (two) times daily as needed (allergies/dry/itchy).  . torsemide (DEMADEX) 20 MG tablet Take 2 tablets by mouth daily.  Marland Kitchen venlafaxine XR (EFFEXOR-XR) 150 MG 24 hr capsule Take total of 225 mg daily (150 mg + 75 mg )  . [DISCONTINUED] insulin NPH-regular Human (70-30) 100 UNIT/ML injection Inject 80 Units into the skin 2 (two) times daily before a meal.  . fluticasone (FLONASE) 50 MCG/ACT nasal spray Place 1 spray into both nostrils daily for 14 days.  Marland Kitchen glucose blood (ACCU-CHEK GUIDE) test strip Use as instructed  . liraglutide (VICTOZA) 18 MG/3ML SOPN Inject 0.2 mLs (1.2 mg total) into the skin daily.  Marland Kitchen lisinopril (ZESTRIL) 5 MG tablet Take 1 tablet (5 mg total) by mouth daily.   No facility-administered encounter medications on file as of 06/06/2020.    ALLERGIES: Allergies  Allergen Reactions  . Iodine-131 Anaphylaxis, Shortness Of Breath and Swelling  . Ivp Dye [Iodinated Diagnostic Agents] Anaphylaxis    Skin gets very red, unable to walk   . Ketorolac Tromethamine Shortness Of Breath  . Tylenol [Acetaminophen] Other (See Comments)    Due to cirrhosis   . Gabapentin   . Morphine And Related   . Nsaids Other (See Comments)    Flares ulcers  . Suboxone [Buprenorphine Hcl-Naloxone Hcl]   . Vancomycin Swelling    Facial swelling,     VACCINATION STATUS: Immunization History  Administered Date(s) Administered  . Influenza,inj,Quad PF,6+ Mos 07/18/2016  . Moderna SARS-COVID-2 Vaccination 03/07/2020, 04/04/2020  .  Pneumococcal Polysaccharide-23 07/18/2016    Diabetes She presents for her initial diabetic visit. She has type 2 diabetes mellitus. Onset time: She was diagnosed at approximate age of 34 years. Her disease course has been improving. There are no hypoglycemic associated symptoms. Pertinent negatives for hypoglycemia include no headaches, nervousness/anxiousness, pallor or tremors. Associated symptoms include fatigue, foot paresthesias,  polydipsia and polyuria. Pertinent negatives for diabetes include no chest pain, no polyphagia and no weight loss. There are no hypoglycemic complications. Symptoms are improving. Diabetic complications include peripheral neuropathy. Risk factors for coronary artery disease include diabetes mellitus, obesity, sedentary lifestyle and dyslipidemia. Current diabetic treatment includes insulin injections and oral agent (monotherapy). She is compliant with treatment most of the time. Her weight is stable. She is following a generally unhealthy diet. When asked about meal planning, she reported none. She has not had a previous visit with a dietitian. She participates in exercise intermittently. Her home blood glucose trend is decreasing steadily. Her breakfast blood glucose range is generally 180-200 mg/dl. Her lunch blood glucose range is generally >200 mg/dl. Her dinner blood glucose range is generally >200 mg/dl. Her bedtime blood glucose range is generally >200 mg/dl. (She brought her meter and logs with her today showing improved but still above target fasting and postprandial glycemic profile.  Her previsit A1c was 8.6%, improving from 10.4% in March 2021.  Her 7-day average blood glucose reading on her monitor was 265 with 25 tests, indicating adequate engagement.) An ACE inhibitor/angiotensin II receptor blocker is not being taken. She does not see a podiatrist.Eye exam is not current.  Hypertension This is a new problem. The current episode started more than 1 month ago. The  problem is unchanged. The problem is uncontrolled. Associated symptoms include peripheral edema. Pertinent negatives include no chest pain, headaches, palpitations or shortness of breath. There are no associated agents to hypertension. Risk factors for coronary artery disease include diabetes mellitus, dyslipidemia, obesity and sedentary lifestyle. Past treatments include diuretics. The current treatment provides no improvement. There are no compliance problems.      Review of Systems  Constitutional: Positive for fatigue. Negative for chills, fever, unexpected weight change and weight loss.  Eyes: Negative for visual disturbance.  Respiratory: Negative for cough and shortness of breath.   Cardiovascular: Positive for leg swelling. Negative for chest pain and palpitations.       Intermittent.  Was hospitalized recently for fluid overload for approximately 5 days and aggressively diuresed.  Gastrointestinal: Negative for constipation and diarrhea.  Endocrine: Positive for polydipsia and polyuria. Negative for cold intolerance, heat intolerance and polyphagia.  Musculoskeletal: Negative for arthralgias and myalgias.  Skin: Negative for color change, pallor, rash and wound.  Neurological: Negative for tremors, numbness and headaches.  Psychiatric/Behavioral: The patient is not nervous/anxious.     Objective:    Vitals with BMI 06/06/2020 05/28/2020 05/20/2020  Height 5' 9"  5' 9"  5' 9"   Weight 282 lbs 3 oz 270 lbs 3 oz 300 lbs  BMI 41.65 89.37 34.28  Systolic 768 115 726  Diastolic 92 74 88  Pulse 203 124 99    BP (!) 146/92 (BP Location: Right Arm, Patient Position: Sitting)   Pulse (!) 102   Ht 5' 9"  (1.753 m)   Wt 282 lb 3.2 oz (128 kg)   BMI 41.67 kg/m   Wt Readings from Last 3 Encounters:  06/06/20 282 lb 3.2 oz (128 kg)  05/28/20 (!) 270 lb 3.2 oz (122.6 kg)  05/20/20 300 lb (136.1 kg)      Physical Exam- Limited  Constitutional:  Body mass index is 41.67 kg/m. , not in  acute distress, normal state of mind Eyes:  EOMI, no exophthalmos Neck: Supple Thyroid: No gross goiter Cardiovascular: RRR, no murmers, rubs, or gallops, no edema Respiratory: Adequate breathing efforts, no crackles, rales, rhonchi, or wheezing Musculoskeletal: no gross deformities,  strength intact in all four extremities, no gross restriction of joint movements Skin:  no rashes, no hyperemia Neurological: no tremor with outstretched hands   Foot exam:   No rashes, ulcers, cuts, calluses, onychodystrophy.   Good pulses bilat.  Good sensation to 10 g monofilament bilat.    CMP ( most recent) CMP     Component Value Date/Time   NA 134 (L) 12/04/2019 1130   K 3.7 12/04/2019 1130   CL 99 12/04/2019 1130   CO2 24 12/04/2019 1130   GLUCOSE 312 (H) 12/04/2019 1130   BUN 8 12/06/2019 0000   CREATININE 0.6 12/06/2019 0000   CREATININE 0.68 12/04/2019 1130   CALCIUM 9.0 12/04/2019 1130   PROT 7.4 12/04/2019 1130   ALBUMIN 3.8 12/04/2019 1130   AST 42 (H) 12/04/2019 1130   ALT 39 12/04/2019 1130   ALKPHOS 102 12/04/2019 1130   BILITOT 1.1 12/04/2019 1130   GFRNONAA >60 12/04/2019 1130   GFRAA >60 12/04/2019 1130     Diabetic Labs (most recent): Lab Results  Component Value Date   HGBA1C 8.6 (A) 05/28/2020   HGBA1C 10.4 12/06/2019     Lipid Panel ( most recent) Lipid Panel     Component Value Date/Time   CHOL 197 12/06/2019 0000   TRIG 134 12/06/2019 0000   HDL 39 12/06/2019 0000   LDLCALC 133 12/06/2019 0000      Lab Results  Component Value Date   TSH 1.690 08/22/2010   FREET4 1.11 08/22/2010      Assessment & Plan:   1. Type 2 diabetes mellitus with hyperglycemia, without long-term current use of insulin (HCC)   - TAMIKI KUBA has currently uncontrolled symptomatic type 2 DM since  34 years of age.  She brought her meter and logs with her today showing improved but still above target fasting and postprandial glycemic profile.  Her previsit A1c  was 8.6%, improving from 10.4% in March 2021.  Her 7-day average blood glucose reading on her monitor was 265 with 25 tests, indicating adequate engagement.  - I had a long discussion with her about the progressive nature of diabetes and the pathology behind its complications. -her diabetes is complicated by obesity/sedentary life and she remains at a high risk for more acute and chronic complications which include CAD, CVA, CKD, retinopathy, and neuropathy. These are all discussed in detail with her.  - The patient admits there is a room for improvement in their diet and drink choices. -  Suggestion is made for the patient to avoid simple carbohydrates from their diet including Cakes, Sweet Desserts / Pastries, Ice Cream, Soda (diet and regular), Sweet Tea, Candies, Chips, Cookies, Sweet Pastries,  Store Bought Juices, Alcohol in Excess of  1-2 drinks a day, Artificial Sweeteners, Coffee Creamer, and "Sugar-free" Products. This will help patient to have stable blood glucose profile and potentially avoid unintended weight gain.   - I encouraged the patient to switch to  unprocessed or minimally processed complex starch and increased protein intake (animal or plant source), fruits, and vegetables.   - Patient is advised to stick to a routine mealtimes to eat 3 meals  a day and avoid unnecessary snacks ( to snack only to correct hypoglycemia).  - she will be scheduled with Jearld Fenton, RDN, CDE for diabetes education.  - I have approached her with the following individualized plan to manage  her diabetes and patient agrees:   -Given her persistent above target fasting and postprandial glycemic profile, she  will tolerate an increase in her insulin to 80 units with breakfast and supper if glucose is above 90 and she is eating.  She is advised to continue Metformin 500 mg ER twice daily with meals. -This patient could greatly benefit from incretin therapy.  I discussed and initiated Victoza 1.2 mg SQ  daily.  -She is encouraged to continue monitoring blood glucose at least 4 times a day, before meals and at bedtime.  She is advised to call the clinic if her blood glucose is less than 70 or above 300 for 3 tests in a row.   - Specific targets for  A1c;  LDL, HDL,  and Triglycerides were discussed with the patient.  2) Blood Pressure /Hypertension: Pressure is not controlled to target.  Her blood pressure has been consistently elevated on at least 2 consecutive visits.  I discussed and initiated low-dose lisinopril 5 mg p.o. daily and patient agrees.  3) Lipids/Hyperlipidemia:   Review of her recent lipid profile shows uncontrolled LDL at 133.  She is not on any statin medications.  She is willing to work on lifestyle modifications and diet.  Will consider adding statin medication if lipids continue to be elevated on subsequent visits.  4)  Weight/Diet:   Body mass index is 41.67 kg/m.  -   clearly complicating her diabetes care.   she is  a candidate for modest weight loss. I discussed with her the fact that loss of 5 - 10% of her  current body weight will have the most impact on her diabetes management.  Exercise, and detailed carbohydrates information provided  -  detailed on discharge instructions.  5) Chronic Care/Health Maintenance:  -she is on ACE medications and  is encouraged to initiate and continue to follow up with Ophthalmology, Dentist,  Podiatrist at least yearly or according to recommendations, and advised to   stay away from smoking. I have recommended yearly flu vaccine and pneumonia vaccine at least every 5 years; moderate intensity exercise for up to 150 minutes weekly; and  sleep for at least 7 hours a day.  - she is  advised to maintain close follow up with Rosita Fire, MD for primary care needs, as well as her other providers for optimal and coordinated care.   - Time spent on this patient care encounter:  35 min, of which > 50% was spent in  counseling and the rest  reviewing her blood glucose logs , discussing her hypoglycemia and hyperglycemia episodes, reviewing her current and  previous labs / studies  ( including abstraction from other facilities) and medications  doses and developing a  long term treatment plan and documenting her care.   Please refer to Patient Instructions for Blood Glucose Monitoring and Insulin/Medications Dosing Guide"  in media tab for additional information. Please  also refer to " Patient Self Inventory" in the Media  tab for reviewed elements of pertinent patient history.  [name] participated in the discussions, expressed understanding, and voiced agreement with the above plans.  All questions were answered to her satisfaction. she is encouraged to contact clinic should she have any questions or concerns prior to her return visit.   Follow up plan: - Return in about 3 months (around 09/06/2020) for Diabetes follow up- A1c and urine micro in office, Bring glucometer and logs, No previsit labs.  Rayetta Pigg, FNP-BC [facility] Phone: [phone] Fax: [phone]   06/06/2020, 4:18 PM  This note was partially dictated with voice recognition software.  Similar sounding words can be transcribed inadequately or may not  be corrected upon review.

## 2020-06-07 ENCOUNTER — Other Ambulatory Visit: Payer: Self-pay

## 2020-06-07 DIAGNOSIS — E1165 Type 2 diabetes mellitus with hyperglycemia: Secondary | ICD-10-CM

## 2020-06-07 MED ORDER — ACCU-CHEK GUIDE VI STRP: ORAL_STRIP | 5 refills | Status: DC

## 2020-06-10 ENCOUNTER — Other Ambulatory Visit: Payer: Self-pay | Admitting: Nurse Practitioner

## 2020-06-10 MED ORDER — INSULIN NPH ISOPHANE & REGULAR (70-30) 100 UNIT/ML ~~LOC~~ SUSP: 80.0000 [IU] | Freq: Two times a day (BID) | SUBCUTANEOUS | 2 refills | Status: DC

## 2020-06-10 MED ORDER — INSULIN ISOPHANE & REGULAR (HUMAN 70-30)100 UNIT/ML KWIKPEN: 80.0000 [IU] | PEN_INJECTOR | Freq: Two times a day (BID) | SUBCUTANEOUS | 3 refills | Status: DC

## 2020-06-13 ENCOUNTER — Other Ambulatory Visit: Payer: Self-pay

## 2020-06-13 ENCOUNTER — Ambulatory Visit
Admission: EM | Admit: 2020-06-13 | Discharge: 2020-06-13 | Disposition: A | Discharge status: Home / Self Care | Payer: Medicaid Other | Attending: Emergency Medicine | Admitting: Emergency Medicine

## 2020-06-13 DIAGNOSIS — B370 Candidal stomatitis: Secondary | ICD-10-CM

## 2020-06-13 DIAGNOSIS — M7989 Other specified soft tissue disorders: Secondary | ICD-10-CM

## 2020-06-13 MED ORDER — NYSTATIN 100000 UNIT/ML MT SUSP
500000.0000 [IU] | Freq: Four times a day (QID) | OROMUCOSAL | 0 refills | Status: DC
Start: 2020-06-13 — End: 2020-09-25

## 2020-06-13 MED ORDER — CYCLOBENZAPRINE HCL 10 MG PO TABS
10.0000 mg | ORAL_TABLET | Freq: Every day | ORAL | 0 refills | Status: DC
Start: 2020-06-13 — End: 2020-09-09

## 2020-06-13 NOTE — ED Triage Notes (Signed)
Pt reports swollen lymph nodes in neck increased leg swelling and mouth burning for past week, was recently hospitalized for fluid volume overload

## 2020-06-13 NOTE — Discharge Instructions (Signed)
Nystatin prescribed.  Use as directed for between 10-14 days or until symptoms resolve Maintain oral hygiene Rinse mouth with salt water or mouth wash after inhaler use to prevent oral thrush secondary to inhaler use Clean dentures frequently as well Follow up with PCP as needed Return or go to the ED if you have any new or worsening symptoms such as fever, chills, nausea, vomiting, difficulty swallowing, fatigue, lymph node swelling, unintentional weight loss, excessive night sweats, etc...  Continue with prescribed medications for leg swelling Flexeril refilled for muscle cramps associated with leg swelling Elevate leg Limit salt and water intake Follow up with PCP for further evaluation and management Go to the ED if you experience worsening symptoms or chest pain, shortness of breath, productive cough, abnormal abdominal bloating/ swelling, changes in urinary habits, etc..Marland Kitchen

## 2020-06-13 NOTE — ED Provider Notes (Signed)
[facility]   [id number] 06/13/20 Arrival Time: 1010  CC: Mouth pain; leg swelling  SUBJECTIVE:  Jaime [name] is a 34 y.o. female who presents with mouth pain x 2 weeks, and lymph node swelling and discomfort x 1 day.  Admits to recent antibiotic use, denture use, and uncontrolled DM.  States average glucose is 250.   Describes pain as constant and burning.  Worse with acidic drink and water.  Reports similar symptoms in the past with thrush.    Also reports chronic bilateral LE swelling x 2 months.  Was hospitalized recently and given IV lasix.  Has tried prescribed lasix and spironolactone with minimal relief.  Has not seen PCP for this problem yet.    Denies fever, chills, abdominal pain, dysphagia, odynophagia, oral or neck swelling, nausea, vomiting, chest pain, SOB, changes in bowel or bladder function  ROS: As per HPI.  All other pertinent ROS negative.     Past Medical History:  Diagnosis Date  . Anxiety   . Asthma   . Chronic abdominal pain   . Chronic back pain   . Cirrhosis ([place])   . Cirrhosis ([place])   . COPD (chronic obstructive pulmonary disease) ([place])   . Depression   . Diabetes mellitus without complication ([place])   . Diabetes mellitus, type II ([place])   . Hepatitis C   . Insomnia   . Long-term current use of methadone for opiate dependence ([place])   . Lupus ([place])   . Migraine headache   . Neuropathy   . Nocturnal seizures ([place])   . Peptic ulcer   . Spleen enlarged    Past Surgical History:  Procedure Laterality Date  . CHOLECYSTECTOMY     Allergies  Allergen Reactions  . Iodine-131 Anaphylaxis, Shortness Of Breath and Swelling  . Ivp Dye [Iodinated Diagnostic Agents] Anaphylaxis    Skin gets very red, unable to walk   . Ketorolac Tromethamine Shortness Of Breath  . Tylenol [Acetaminophen] Other (See Comments)    Due to cirrhosis   . Gabapentin   . Morphine And Related   . Nsaids Other (See Comments)    Flares ulcers  . Suboxone  [Buprenorphine Hcl-Naloxone Hcl]   . Vancomycin Swelling    Facial swelling,    No current facility-administered medications on file prior to encounter.   Current Outpatient Medications on File Prior to Encounter  Medication Sig Dispense Refill  . albuterol (PROVENTIL) (2.5 MG/3ML) 0.083% nebulizer solution Take 2.5 mg by nebulization every 6 (six) hours as needed for wheezing or shortness of breath.    Marland Kitchen aspirin EC 81 MG tablet Take 162 mg by mouth daily. Swallow whole.    . clonazePAM (KLONOPIN) 0.5 MG tablet Take 1 tablet (0.5 mg total) by mouth 2 (two) times daily. 60 tablet 2  . diclofenac Sodium (VOLTAREN) 1 % GEL Apply topically.    Marland Kitchen etonogestrel (NEXPLANON) 68 MG IMPL implant 1 each by Subdermal route once.    . fluticasone (FLONASE) 50 MCG/ACT nasal spray Place 1 spray into both nostrils daily for 14 days. 16 g 0  . furosemide (LASIX) 40 MG tablet Take 1 tablet by mouth daily.    Marland Kitchen glucose blood (ACCU-CHEK GUIDE) test strip Use as instructed to check blood glucose four times daily 150 each 5  . insulin isophane & regular human (HUMULIN 70/30 MIX) (70-30) 100 UNIT/ML KwikPen Inject 80 Units into the skin 2 (two) times daily with a meal. 45 mL 3  . liraglutide (VICTOZA)  18 MG/3ML SOPN Inject 0.2 mLs (1.2 mg total) into the skin daily. 9 mL 3  . lisinopril (ZESTRIL) 5 MG tablet Take 1 tablet (5 mg total) by mouth daily. 90 tablet 3  . metFORMIN (GLUCOPHAGE-XR) 500 MG 24 hr tablet Take 1 tablet (500 mg total) by mouth 2 (two) times daily after a meal. 180 tablet 1  . methadone (DOLOPHINE) 10 MG/5ML solution Take 45 mg by mouth every morning.     . potassium chloride (KLOR-CON) 10 MEQ tablet Take 1 tablet by mouth daily.    . pregabalin (LYRICA) 75 MG capsule Take 75 mg by mouth 2 (two) times daily.    . QUEtiapine (SEROQUEL) 25 MG tablet Take 1 tablet (25 mg total) by mouth at bedtime. 90 tablet 0  . RESTASIS 0.05 % ophthalmic emulsion Place 1 drop into both eyes as needed.     Marland Kitchen  spironolactone (ALDACTONE) 100 MG tablet Take 1 tablet by mouth daily.    Marland Kitchen tetrahydrozoline 0.05 % ophthalmic solution Place 2 drops into both eyes 2 (two) times daily as needed (allergies/dry/itchy).    . torsemide (DEMADEX) 20 MG tablet Take 2 tablets by mouth daily.    Marland Kitchen venlafaxine XR (EFFEXOR-XR) 150 MG 24 hr capsule Take total of 225 mg daily (150 mg + 75 mg ) 90 capsule 0   Social History   Socioeconomic History  . Marital status: Single    Spouse name: Not on file  . Number of children: Not on file  . Years of education: Not on file  . Highest education level: Not on file  Occupational History  . Not on file  Tobacco Use  . Smoking status: Current Every Day Smoker    Packs/day: 0.00    Years: 5.00    Pack years: 0.00    Types: E-cigarettes  . Smokeless tobacco: Never Used  Vaping Use  . Vaping Use: Every day  . Substances: Nicotine, Flavoring  Substance and Sexual Activity  . Alcohol use: No  . Drug use: No  . Sexual activity: Not Currently    Birth control/protection: Implant  Other Topics Concern  . Not on file  Social History Narrative  . Not on file   Social Determinants of Health   Financial Resource Strain: Medium Risk  . Difficulty of Paying Living Expenses: Somewhat hard  Food Insecurity: No Food Insecurity  . Worried About Charity fundraiser in the Last Year: Never true  . Ran Out of Food in the Last Year: Never true  Transportation Needs: No Transportation Needs  . Lack of Transportation (Medical): No  . Lack of Transportation (Non-Medical): No  Physical Activity: Insufficiently Active  . Days of Exercise per Week: 1 day  . Minutes of Exercise per Session: 20 min  Stress: Stress Concern Present  . Feeling of Stress : Very much  Social Connections: Moderately Isolated  . Frequency of Communication with Friends and Family: More than three times a week  . Frequency of Social Gatherings with Friends and Family: Twice a week  . Attends Religious  Services: More than 4 times per year  . Active Member of Clubs or Organizations: No  . Attends Archivist Meetings: Never  . Marital Status: Never married  Intimate Partner Violence: Not At Risk  . Fear of Current or Ex-Partner: No  . Emotionally Abused: No  . Physically Abused: No  . Sexually Abused: No   Family History  Problem Relation Age of Onset  . Hypertension  Mother   . Hyperlipidemia Mother   . Hyperlipidemia Maternal Grandfather     OBJECTIVE:  Vitals:   06/13/20 1027 06/13/20 1029  BP:  (!) 151/88  Pulse:  (!) 101  Resp:  20  Temp:  98.6 F (37 C)  SpO2:  98%  Weight: 284 lb 6.3 oz (129 kg)     General appearance: alert; no distress HENT: normocephalic; atraumatic; EACs clear, TMs pearly gray; dentition: wears dentures; white film on tongue.  Tonsils not enlarged or erythematous, oropharynx clear Neck: supple; TTP over submandibular lymph nodes; difficult to assess LAD due to body habitus Lungs: normal respirations; CTAB CV: RRR LE: slight pitting edema to proximal anterior tibia Skin: warm and dry Psychological: alert and cooperative; normal mood and affect  ASSESSMENT & PLAN:  1. Oral candidiasis   2. Leg swelling     Meds ordered this encounter  Medications  . nystatin (MYCOSTATIN) 100000 UNIT/ML suspension    Sig: Take 5 mLs (500,000 Units total) by mouth 4 (four) times daily.    Dispense:  473 mL    Refill:  0    Order Specific Question:   Supervising Provider    Answer:   Raylene Everts [[id number]]  . cyclobenzaprine (FLEXERIL) 10 MG tablet    Sig: Take 1 tablet (10 mg total) by mouth at bedtime.    Dispense:  15 tablet    Refill:  0    Order Specific Question:   Supervising Provider    Answer:   Raylene Everts [[id number]]    Nystatin prescribed.  Use as directed for between 10-14 days or until symptoms resolve Maintain oral hygiene Rinse mouth with salt water or mouth wash after inhaler use to prevent oral thrush secondary  to inhaler use Clean dentures frequently as well Follow up with PCP as needed Return or go to the ED if you have any new or worsening symptoms such as fever, chills, nausea, vomiting, difficulty swallowing, fatigue, lymph node swelling, unintentional weight loss, excessive night sweats, etc...  Continue with prescribed medications for leg swelling Flexeril refilled for muscle cramps associated with leg swelling Elevate leg Limit salt and water intake Follow up with PCP for further evaluation and management Go to the ED if you experience worsening symptoms or chest pain, shortness of breath, productive cough, abnormal abdominal bloating/ swelling, changes in urinary habits, etc...   Reviewed expectations re: course of current medical issues. Questions answered. Outlined signs and symptoms indicating need for more acute intervention. Patient verbalized understanding. After Visit Summary given.   Lestine Box, PA-C 06/13/20 1109

## 2020-06-28 ENCOUNTER — Telehealth (HOSPITAL_COMMUNITY): Payer: Self-pay | Admitting: Psychiatry

## 2020-06-28 NOTE — Telephone Encounter (Signed)
Called patient to reschedule left detailed voicemail

## 2020-07-09 ENCOUNTER — Encounter: Payer: Self-pay | Admitting: Emergency Medicine

## 2020-07-09 ENCOUNTER — Other Ambulatory Visit: Payer: Self-pay

## 2020-07-09 ENCOUNTER — Ambulatory Visit
Admission: EM | Admit: 2020-07-09 | Discharge: 2020-07-09 | Disposition: A | Discharge status: Home / Self Care | Payer: Medicaid Other | Attending: Emergency Medicine | Admitting: Emergency Medicine

## 2020-07-09 DIAGNOSIS — R52 Pain, unspecified: Secondary | ICD-10-CM | POA: Diagnosis present

## 2020-07-09 DIAGNOSIS — Z1152 Encounter for screening for COVID-19: Secondary | ICD-10-CM | POA: Diagnosis present

## 2020-07-09 DIAGNOSIS — J069 Acute upper respiratory infection, unspecified: Secondary | ICD-10-CM | POA: Diagnosis present

## 2020-07-09 LAB — POCT RAPID STREP A (OFFICE): Rapid Strep A Screen: NEGATIVE

## 2020-07-09 MED ORDER — PREDNISONE 10 MG PO TABS
20.0000 mg | ORAL_TABLET | Freq: Every day | ORAL | 0 refills | Status: DC
Start: 2020-07-09 — End: 2020-08-01

## 2020-07-09 MED ORDER — FLUTICASONE PROPIONATE 50 MCG/ACT NA SUSP: 1.0000 | Freq: Every day | NASAL | 0 refills | Status: DC

## 2020-07-09 MED ORDER — CETIRIZINE HCL 10 MG PO TABS: 10.0000 mg | ORAL_TABLET | Freq: Every day | ORAL | 0 refills | Status: DC

## 2020-07-09 MED ORDER — BENZONATATE 100 MG PO CAPS: 100.0000 mg | ORAL_CAPSULE | Freq: Three times a day (TID) | ORAL | 0 refills | Status: DC

## 2020-07-09 NOTE — Discharge Instructions (Addendum)
COVID testing ordered.  It will take between 2-7 days for test results.  Someone will contact you regarding abnormal results.    In the meantime: You should remain isolated in your home for 10 days from symptom onset AND greater than 24 hours after symptoms resolution (absence of fever without the use of fever-reducing medication and improvement in respiratory symptoms), whichever is longer Get plenty of rest and push fluids Tessalon Perles prescribed for cough Zyrtec for nasal congestion, runny nose, and/or sore throat Flonase for nasal congestion and runny nose Use medications daily for symptom relief Use OTC medications like ibuprofen or tylenol as needed fever or pain Call or go to the ED if you have any new or worsening symptoms such as fever, worsening cough, shortness of breath, chest tightness, chest pain, turning blue, changes in mental status, etc..Marland Kitchen

## 2020-07-09 NOTE — ED Provider Notes (Signed)
[facility]   [id number] 07/09/20 Arrival Time: 0903   CC: COVID symptoms  SUBJECTIVE: History from: patient.  Jaime [name] is a 34 y.o. female who presents to the urgent care for complaint of sore throat, body aches, headaches started yesterday.  Denies sick exposure to COVID, flu or strep.  Denies recent travel.  Has tried OTC medication without relief.  Denies aggravating factors.  Denies previous symptoms in the past.   Denies fever, chills, fatigue, sinus pain, rhinorrhea, sore throat, SOB, wheezing, chest pain, nausea, changes in bowel or bladder habits.     ROS: As per HPI.  All other pertinent ROS negative.     Past Medical History:  Diagnosis Date  . Anxiety   . Asthma   . Chronic abdominal pain   . Chronic back pain   . Cirrhosis ([place])   . Cirrhosis ([place])   . COPD (chronic obstructive pulmonary disease) ([place])   . Depression   . Diabetes mellitus without complication ([place])   . Diabetes mellitus, type II ([place])   . Hepatitis C   . Insomnia   . Long-term current use of methadone for opiate dependence ([place])   . Lupus ([place])   . Migraine headache   . Neuropathy   . Nocturnal seizures ([place])   . Peptic ulcer   . Spleen enlarged    Past Surgical History:  Procedure Laterality Date  . CHOLECYSTECTOMY     Allergies  Allergen Reactions  . Iodine-131 Anaphylaxis, Shortness Of Breath and Swelling  . Ivp Dye [Iodinated Diagnostic Agents] Anaphylaxis    Skin gets very red, unable to walk   . Ketorolac Tromethamine Shortness Of Breath  . Tylenol [Acetaminophen] Other (See Comments)    Due to cirrhosis   . Gabapentin   . Morphine And Related   . Nsaids Other (See Comments)    Flares ulcers  . Suboxone [Buprenorphine Hcl-Naloxone Hcl]   . Vancomycin Swelling    Facial swelling,    No current facility-administered medications on file prior to encounter.   Current Outpatient Medications on File Prior to Encounter  Medication Sig Dispense Refill  .  albuterol (PROVENTIL) (2.5 MG/3ML) 0.083% nebulizer solution Take 2.5 mg by nebulization every 6 (six) hours as needed for wheezing or shortness of breath.    Marland Kitchen aspirin EC 81 MG tablet Take 162 mg by mouth daily. Swallow whole.    . clonazePAM (KLONOPIN) 0.5 MG tablet Take 1 tablet (0.5 mg total) by mouth 2 (two) times daily. 60 tablet 2  . cyclobenzaprine (FLEXERIL) 10 MG tablet Take 1 tablet (10 mg total) by mouth at bedtime. 15 tablet 0  . diclofenac Sodium (VOLTAREN) 1 % GEL Apply topically.    Marland Kitchen etonogestrel (NEXPLANON) 68 MG IMPL implant 1 each by Subdermal route once.    . furosemide (LASIX) 40 MG tablet Take 1 tablet by mouth daily.    Marland Kitchen glucose blood (ACCU-CHEK GUIDE) test strip Use as instructed to check blood glucose four times daily 150 each 5  . insulin isophane & regular human (HUMULIN 70/30 MIX) (70-30) 100 UNIT/ML KwikPen Inject 80 Units into the skin 2 (two) times daily with a meal. 45 mL 3  . liraglutide (VICTOZA) 18 MG/3ML SOPN Inject 0.2 mLs (1.2 mg total) into the skin daily. 9 mL 3  . lisinopril (ZESTRIL) 5 MG tablet Take 1 tablet (5 mg total) by mouth daily. 90 tablet 3  . metFORMIN (GLUCOPHAGE-XR) 500 MG 24 hr tablet Take 1 tablet (500 mg  total) by mouth 2 (two) times daily after a meal. 180 tablet 1  . methadone (DOLOPHINE) 10 MG/5ML solution Take 45 mg by mouth every morning.     . nystatin (MYCOSTATIN) 100000 UNIT/ML suspension Take 5 mLs (500,000 Units total) by mouth 4 (four) times daily. 473 mL 0  . potassium chloride (KLOR-CON) 10 MEQ tablet Take 1 tablet by mouth daily.    . pregabalin (LYRICA) 75 MG capsule Take 75 mg by mouth 2 (two) times daily.    . QUEtiapine (SEROQUEL) 25 MG tablet Take 1 tablet (25 mg total) by mouth at bedtime. 90 tablet 0  . RESTASIS 0.05 % ophthalmic emulsion Place 1 drop into both eyes as needed.     Marland Kitchen spironolactone (ALDACTONE) 100 MG tablet Take 1 tablet by mouth daily.    Marland Kitchen tetrahydrozoline 0.05 % ophthalmic solution Place 2 drops into  both eyes 2 (two) times daily as needed (allergies/dry/itchy).    . torsemide (DEMADEX) 20 MG tablet Take 2 tablets by mouth daily.    Marland Kitchen venlafaxine XR (EFFEXOR-XR) 150 MG 24 hr capsule Take total of 225 mg daily (150 mg + 75 mg ) 90 capsule 0   Social History   Socioeconomic History  . Marital status: Single    Spouse name: Not on file  . Number of children: Not on file  . Years of education: Not on file  . Highest education level: Not on file  Occupational History  . Not on file  Tobacco Use  . Smoking status: Current Every Day Smoker    Packs/day: 0.00    Years: 5.00    Pack years: 0.00    Types: E-cigarettes  . Smokeless tobacco: Never Used  Vaping Use  . Vaping Use: Every day  . Substances: Nicotine, Flavoring  Substance and Sexual Activity  . Alcohol use: No  . Drug use: No  . Sexual activity: Not Currently    Birth control/protection: Implant  Other Topics Concern  . Not on file  Social History Narrative  . Not on file   Social Determinants of Health   Financial Resource Strain: Medium Risk  . Difficulty of Paying Living Expenses: Somewhat hard  Food Insecurity: No Food Insecurity  . Worried About Charity fundraiser in the Last Year: Never true  . Ran Out of Food in the Last Year: Never true  Transportation Needs: No Transportation Needs  . Lack of Transportation (Medical): No  . Lack of Transportation (Non-Medical): No  Physical Activity: Insufficiently Active  . Days of Exercise per Week: 1 day  . Minutes of Exercise per Session: 20 min  Stress: Stress Concern Present  . Feeling of Stress : Very much  Social Connections: Moderately Isolated  . Frequency of Communication with Friends and Family: More than three times a week  . Frequency of Social Gatherings with Friends and Family: Twice a week  . Attends Religious Services: More than 4 times per year  . Active Member of Clubs or Organizations: No  . Attends Archivist Meetings: Never  .  Marital Status: Never married  Intimate Partner Violence: Not At Risk  . Fear of Current or Ex-Partner: No  . Emotionally Abused: No  . Physically Abused: No  . Sexually Abused: No   Family History  Problem Relation Age of Onset  . Hypertension Mother   . Hyperlipidemia Mother   . Hyperlipidemia Maternal Grandfather     OBJECTIVE:  Vitals:   07/09/20 0918 07/09/20 0919  BP:  120/80  Pulse:  (!) 106  Resp:  19  Temp:  98.5 F (36.9 C)  TempSrc:  Oral  SpO2:  97%  Weight: 285 lb (129.3 kg)   Height: 5' 9"  (1.753 m)      General appearance: alert; appears fatigued, but nontoxic; speaking in full sentences and tolerating own secretions HEENT: NCAT; Ears: EACs clear, TMs pearly gray; Eyes: PERRL.  EOM grossly intact. Sinuses: nontender; Nose: nares patent without rhinorrhea, Throat: oropharynx clear, tonsils non erythematous or enlarged, uvula midline  Neck: supple without LAD Lungs: unlabored respirations, symmetrical air entry; cough: moderate; no respiratory distress; CTAB Heart: regular rate and rhythm.  Radial pulses 2+ symmetrical bilaterally Skin: warm and dry Psychological: alert and cooperative; normal mood and affect  LABS:  No results found for this or any previous visit (from the past 24 hour(s)).   ASSESSMENT & PLAN:  1. Generalized body aches   2. Viral URI   3. Encounter for screening for COVID-19     Meds ordered this encounter  Medications  . predniSONE (DELTASONE) 10 MG tablet    Sig: Take 2 tablets (20 mg total) by mouth daily.    Dispense:  15 tablet    Refill:  0  . benzonatate (TESSALON) 100 MG capsule    Sig: Take 1 capsule (100 mg total) by mouth every 8 (eight) hours.    Dispense:  30 capsule    Refill:  0  . fluticasone (FLONASE) 50 MCG/ACT nasal spray    Sig: Place 1 spray into both nostrils daily for 14 days.    Dispense:  16 g    Refill:  0  . cetirizine (ZYRTEC ALLERGY) 10 MG tablet    Sig: Take 1 tablet (10 mg total) by mouth  daily.    Dispense:  30 tablet    Refill:  0    Discharge Instructions.    COVID testing ordered.  It will take between 2-7 days for test results.  Someone will contact you regarding abnormal results.    In the meantime: You should remain isolated in your home for 10 days from symptom onset AND greater than 24 hours after symptoms resolution (absence of fever without the use of fever-reducing medication and improvement in respiratory symptoms), whichever is longer Get plenty of rest and push fluids Tessalon Perles prescribed for cough Zyrtec for nasal congestion, runny nose, and/or sore throat Flonase for nasal congestion and runny nose Use medications daily for symptom relief Use OTC medications like ibuprofen or tylenol as needed fever or pain Call or go to the ED if you have any new or worsening symptoms such as fever, worsening cough, shortness of breath, chest tightness, chest pain, turning blue, changes in mental status, etc...   Reviewed expectations re: course of current medical issues. Questions answered. Outlined signs and symptoms indicating need for more acute intervention. Patient verbalized understanding. After Visit Summary given.      Note: This document was prepared using Dragon voice recognition software and may include unintentional dictation errors.    Emerson Monte, [employer] 07/09/20 [phone]

## 2020-07-09 NOTE — ED Triage Notes (Signed)
Sore throat, body aches, headache since yesterday

## 2020-07-10 ENCOUNTER — Telehealth: Payer: Self-pay | Admitting: "Endocrinology

## 2020-07-10 ENCOUNTER — Other Ambulatory Visit: Payer: Self-pay

## 2020-07-10 DIAGNOSIS — E1165 Type 2 diabetes mellitus with hyperglycemia: Secondary | ICD-10-CM

## 2020-07-10 MED ORDER — LIRAGLUTIDE 18 MG/3ML ~~LOC~~ SOPN: 1.2000 mg | PEN_INJECTOR | Freq: Every day | SUBCUTANEOUS | 3 refills | Status: DC

## 2020-07-10 MED ORDER — INSULIN ISOPHANE & REGULAR (HUMAN 70-30)100 UNIT/ML KWIKPEN: 80.0000 [IU] | PEN_INJECTOR | Freq: Two times a day (BID) | SUBCUTANEOUS | 3 refills | Status: DC

## 2020-07-10 NOTE — Telephone Encounter (Signed)
Pt requesting her medication insulin isophane & regular human (HUMULIN 70/30 MIX) (70-30) 100 UNIT/ML KwikPen  Be sent to pharmacy below.  [place], [place] - [id number] [place] Phone:  [phone]  Fax:  [phone]

## 2020-07-10 NOTE — Telephone Encounter (Signed)
Jaime [name],  I see that she went to urgent care yesterday for URI symptoms.  Viral illnesses/infections can cause blood sugar to rise in reaction to the body's stress response.  She can increase her Humulin 70/30 to 85 units with breakfast and 85 units with supper for the next 2-3 days.  Hopefully by then she will be feeling better and glucose levels will start to decrease and then she can go back to her 80 units twice daily.   Thanks, [employer]

## 2020-07-10 NOTE — Telephone Encounter (Signed)
Pt request call back due to 3 readings of over 300.  9/7   Lunch 351  Bedtime 410  9/8   AM 310   [phone]

## 2020-07-10 NOTE — Telephone Encounter (Signed)
Pt is also requesting liraglutide (VICTOZA) 18 MG/3ML SOPN  To be sent to [facility] as well.

## 2020-07-10 NOTE — Telephone Encounter (Signed)
Sent in

## 2020-07-10 NOTE — Telephone Encounter (Signed)
Please advise 

## 2020-07-10 NOTE — Telephone Encounter (Signed)
I called patient back and advised of instructions, She just started taking Prednisone today that they prescribed her at urgent care, She has an appointment tomorrow with Kieth Brightly so she will touch base in regards to her blood glucose readings when she is here.

## 2020-07-11 ENCOUNTER — Ambulatory Visit: Payer: Medicaid Other | Admitting: Nutrition

## 2020-07-11 LAB — NOVEL CORONAVIRUS, NAA: SARS-CoV-2, NAA: NOT DETECTED

## 2020-07-11 LAB — SARS-COV-2, NAA 2 DAY TAT

## 2020-07-11 NOTE — Telephone Encounter (Signed)
Patient called in with more high readings.   9/8  343 Lunch , 373 Supper 384 bedtime  Today   298 AM, 406 Lunch

## 2020-07-11 NOTE — Telephone Encounter (Signed)
She is coming to see Kieth Brightly today, so I plan on giving her the same instructions:  She can increase her insulin to 90 units twice daily (breakfast and supper) and monitor how her blood sugar reacts for 2-3 days.  If her blood sugar has not come down any over those few days, she can increase her insulin to 95 units twice daily.  -Once she is finished with the prednisone, her blood sugars should start leveling out and we can decrease her insulin dosages back down.  It is imperative she monitor blood glucose closely over the next few weeks and let me know if she starts dropping quickly.

## 2020-07-11 NOTE — Telephone Encounter (Signed)
Please advise 

## 2020-07-11 NOTE — Telephone Encounter (Signed)
Returned call to patient, advised of instructions , verbalized understanding.

## 2020-07-12 LAB — CULTURE, GROUP A STREP (THRC): Special Requests: NORMAL
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
## 2020-07-17 ENCOUNTER — Telehealth (HOSPITAL_COMMUNITY): Payer: Medicaid Other | Admitting: Psychiatry

## 2020-07-19 ENCOUNTER — Other Ambulatory Visit: Payer: Self-pay

## 2020-07-19 ENCOUNTER — Telehealth (INDEPENDENT_AMBULATORY_CARE_PROVIDER_SITE_OTHER): Payer: Medicaid Other | Admitting: Psychiatry

## 2020-07-19 ENCOUNTER — Encounter (HOSPITAL_COMMUNITY): Payer: Self-pay | Admitting: Psychiatry

## 2020-07-19 DIAGNOSIS — F419 Anxiety disorder, unspecified: Secondary | ICD-10-CM

## 2020-07-19 DIAGNOSIS — F330 Major depressive disorder, recurrent, mild: Secondary | ICD-10-CM | POA: Diagnosis not present

## 2020-07-19 DIAGNOSIS — G4700 Insomnia, unspecified: Secondary | ICD-10-CM | POA: Diagnosis not present

## 2020-07-19 MED ORDER — QUETIAPINE FUMARATE 25 MG PO TABS
25.0000 mg | ORAL_TABLET | Freq: Every day | ORAL | 0 refills | Status: DC
Start: 2020-07-19 — End: 2020-08-23

## 2020-07-19 MED ORDER — CLONAZEPAM 0.5 MG PO TABS: 0.5000 mg | ORAL_TABLET | Freq: Two times a day (BID) | ORAL | 0 refills | Status: DC

## 2020-07-19 MED ORDER — VENLAFAXINE HCL ER 150 MG PO CP24
ORAL_CAPSULE | ORAL | 0 refills | Status: DC
Start: 2020-07-19 — End: 2020-08-23

## 2020-07-19 NOTE — Patient Instructions (Signed)
1.Continuevenlafaxine 225 mg daily 2. Continue quetiapine 25 mg at night 3.Continueclonazepam 0.5 mg twice a day as needed for anxiety   4. Next appointment:10/22 at 11:40

## 2020-07-22 ENCOUNTER — Other Ambulatory Visit: Payer: Self-pay

## 2020-07-22 ENCOUNTER — Ambulatory Visit (INDEPENDENT_AMBULATORY_CARE_PROVIDER_SITE_OTHER): Payer: Medicaid Other | Admitting: Nurse Practitioner

## 2020-07-22 ENCOUNTER — Encounter: Payer: Self-pay | Admitting: Nurse Practitioner

## 2020-07-22 DIAGNOSIS — E1165 Type 2 diabetes mellitus with hyperglycemia: Secondary | ICD-10-CM

## 2020-07-22 MED ORDER — INSULIN ISOPHANE & REGULAR (HUMAN 70-30)100 UNIT/ML KWIKPEN: 90.0000 [IU] | PEN_INJECTOR | Freq: Two times a day (BID) | SUBCUTANEOUS | 6 refills | Status: DC

## 2020-07-22 MED ORDER — VICTOZA 18 MG/3ML ~~LOC~~ SOPN: 1.8000 mg | PEN_INJECTOR | Freq: Every day | SUBCUTANEOUS | 3 refills | Status: DC

## 2020-07-22 MED ORDER — DEXCOM G6 TRANSMITTER MISC: 1.0000 | 1 refills | Status: DC

## 2020-07-22 MED ORDER — DEXCOM G6 SENSOR MISC: 4.0000 | 2 refills | Status: DC

## 2020-07-22 MED ORDER — METFORMIN HCL 1000 MG PO TABS: 1000.0000 mg | ORAL_TABLET | Freq: Two times a day (BID) | ORAL | 3 refills | Status: DC

## 2020-07-22 NOTE — Patient Instructions (Signed)

## 2020-07-22 NOTE — Progress Notes (Signed)
07/22/2020, 11:22 AM     Endocrinology Follow Up Visit  Subjective:    Patient ID: Jaime [name], female    DOB: [birth year].  Jaime [name] is being seen in follow up for management of currently uncontrolled symptomatic diabetes requested by  Rosita Fire, MD.   Past Medical History:  Diagnosis Date  . Anxiety   . Asthma   . Chronic abdominal pain   . Chronic back pain   . Cirrhosis ([place])   . Cirrhosis ([place])   . COPD (chronic obstructive pulmonary disease) ([place])   . Depression   . Diabetes mellitus without complication ([place])   . Diabetes mellitus, type II ([place])   . Hepatitis C   . Insomnia   . Long-term current use of methadone for opiate dependence ([place])   . Lupus ([place])   . Migraine headache   . Neuropathy   . Nocturnal seizures ([place])   . Peptic ulcer   . Spleen enlarged     Past Surgical History:  Procedure Laterality Date  . CHOLECYSTECTOMY      Social History   Socioeconomic History  . Marital status: Single    Spouse name: Not on file  . Number of children: Not on file  . Years of education: Not on file  . Highest education level: Not on file  Occupational History  . Not on file  Tobacco Use  . Smoking status: Current Every Day Smoker    Packs/day: 0.00    Years: 5.00    Pack years: 0.00    Types: E-cigarettes  . Smokeless tobacco: Never Used  Vaping Use  . Vaping Use: Every day  . Substances: Nicotine, Flavoring  Substance and Sexual Activity  . Alcohol use: No  . Drug use: No  . Sexual activity: Not Currently    Birth control/protection: Implant  Other Topics Concern  . Not on file  Social History Narrative  . Not on file   Social Determinants of Health   Financial Resource Strain: Medium Risk  . Difficulty of Paying Living Expenses: Somewhat hard  Food Insecurity: No Food Insecurity  . Worried About Charity fundraiser in the Last Year: Never true   . Ran Out of Food in the Last Year: Never true  Transportation Needs: No Transportation Needs  . Lack of Transportation (Medical): No  . Lack of Transportation (Non-Medical): No  Physical Activity: Insufficiently Active  . Days of Exercise per Week: 1 day  . Minutes of Exercise per Session: 20 min  Stress: Stress Concern Present  . Feeling of Stress : Very much  Social Connections: Moderately Isolated  . Frequency of Communication with Friends and Family: More than three times a week  . Frequency of Social Gatherings with Friends and Family: Twice a week  . Attends Religious Services: More than 4 times per year  . Active Member of Clubs or Organizations: No  . Attends Archivist Meetings: Never  . Marital Status: Never married    Family History  Problem Relation Age of Onset  . Hypertension Mother   . Hyperlipidemia Mother   . Hyperlipidemia  Maternal Grandfather     Outpatient Encounter Medications as of 07/22/2020  Medication Sig  . Accu-Chek Softclix Lancets lancets 4 (four) times daily.  Marland Kitchen albuterol (PROVENTIL) (2.5 MG/3ML) 0.083% nebulizer solution Take 2.5 mg by nebulization every 6 (six) hours as needed for wheezing or shortness of breath.  Marland Kitchen aspirin EC 81 MG tablet Take 162 mg by mouth daily. Swallow whole.  . benzonatate (TESSALON) 100 MG capsule Take 1 capsule (100 mg total) by mouth every 8 (eight) hours.  . cetirizine (ZYRTEC ALLERGY) 10 MG tablet Take 1 tablet (10 mg total) by mouth daily.  Derrill Memo ON 08/07/2020] clonazePAM (KLONOPIN) 0.5 MG tablet Take 1 tablet (0.5 mg total) by mouth 2 (two) times daily.  . cyclobenzaprine (FLEXERIL) 10 MG tablet Take 1 tablet (10 mg total) by mouth at bedtime.  . diclofenac Sodium (VOLTAREN) 1 % GEL Apply topically.  Marland Kitchen etonogestrel (NEXPLANON) 68 MG IMPL implant 1 each by Subdermal route once.  . fluconazole (DIFLUCAN) 200 MG tablet Take 400 mg by mouth daily.  . fluticasone (FLONASE) 50 MCG/ACT nasal spray Place 1 spray  into both nostrils daily for 14 days.  . furosemide (LASIX) 40 MG tablet Take 1 tablet by mouth daily.  Marland Kitchen glucose blood (ACCU-CHEK GUIDE) test strip Use as instructed to check blood glucose four times daily  . hyoscyamine (LEVSIN SL) 0.125 MG SL tablet Place under the tongue every 6 (six) hours as needed.  . insulin isophane & regular human (HUMULIN 70/30 MIX) (70-30) 100 UNIT/ML KwikPen Inject 90 Units into the skin 2 (two) times daily with a meal.  . lisinopril (ZESTRIL) 5 MG tablet Take 1 tablet (5 mg total) by mouth daily.  . methadone (DOLOPHINE) 10 MG/5ML solution Take 15 mg by mouth every morning.   Marland Kitchen NOVOLOG FLEXPEN 100 UNIT/ML FlexPen SMARTSIG:10 Unit(s) SUB-Q  . nystatin (MYCOSTATIN) 100000 UNIT/ML suspension Take 5 mLs (500,000 Units total) by mouth 4 (four) times daily.  . ondansetron (ZOFRAN-ODT) 4 MG disintegrating tablet Take 4 mg by mouth every 8 (eight) hours as needed.  . pantoprazole (PROTONIX) 40 MG tablet Take 40 mg by mouth 2 (two) times daily.  . potassium chloride (KLOR-CON) 10 MEQ tablet Take 1 tablet by mouth daily.  . pregabalin (LYRICA) 75 MG capsule Take 75 mg by mouth 2 (two) times daily.  . QUEtiapine (SEROQUEL) 25 MG tablet Take 1 tablet (25 mg total) by mouth at bedtime.  . RESTASIS 0.05 % ophthalmic emulsion Place 1 drop into both eyes as needed.   Marland Kitchen spironolactone (ALDACTONE) 100 MG tablet Take 1 tablet by mouth daily.  . sucralfate (CARAFATE) 1 g tablet Take 1 g by mouth 4 (four) times daily.  Marland Kitchen tetrahydrozoline 0.05 % ophthalmic solution Place 2 drops into both eyes 2 (two) times daily as needed (allergies/dry/itchy).  . torsemide (DEMADEX) 20 MG tablet Take 2 tablets by mouth daily.  Marland Kitchen venlafaxine XR (EFFEXOR-XR) 150 MG 24 hr capsule Take total of 225 mg daily (150 mg + 75 mg )  . zaleplon (SONATA) 10 MG capsule Take 10 mg by mouth at bedtime as needed for sleep.  . [DISCONTINUED] insulin isophane & regular human (HUMULIN 70/30 MIX) (70-30) 100 UNIT/ML  KwikPen Inject 80 Units into the skin 2 (two) times daily with a meal. (Patient taking differently: Inject 90 Units into the skin 2 (two) times daily with a meal. )  . [DISCONTINUED] liraglutide (VICTOZA) 18 MG/3ML SOPN Inject 0.2 mLs (1.2 mg total) into the skin daily.  . [  DISCONTINUED] metFORMIN (GLUCOPHAGE-XR) 500 MG 24 hr tablet Take 1 tablet (500 mg total) by mouth 2 (two) times daily after a meal.  . Continuous Blood Gluc Sensor (DEXCOM G6 SENSOR) MISC 4 Pieces by Does not apply route once a week.  . Continuous Blood Gluc Transmit (DEXCOM G6 TRANSMITTER) MISC 1 Piece by Does not apply route as directed.  . liraglutide (VICTOZA) 18 MG/3ML SOPN Inject 1.8 mg into the skin daily.  . metFORMIN (GLUCOPHAGE) 1000 MG tablet Take 1 tablet (1,000 mg total) by mouth 2 (two) times daily with a meal.  . predniSONE (DELTASONE) 10 MG tablet Take 2 tablets (20 mg total) by mouth daily. (Patient not taking: Reported on 07/22/2020)   No facility-administered encounter medications on file as of 07/22/2020.    ALLERGIES: Allergies  Allergen Reactions  . Iodine-131 Anaphylaxis, Shortness Of Breath and Swelling  . Ivp Dye [Iodinated Diagnostic Agents] Anaphylaxis    Skin gets very red, unable to walk   . Ketorolac Tromethamine Shortness Of Breath  . Tylenol [Acetaminophen] Other (See Comments)    Due to cirrhosis   . Gabapentin   . Morphine And Related   . Nsaids Other (See Comments)    Flares ulcers  . Suboxone [Buprenorphine Hcl-Naloxone Hcl]   . Vancomycin Swelling    Facial swelling,     VACCINATION STATUS: Immunization History  Administered Date(s) Administered  . Influenza,inj,Quad PF,6+ Mos 07/18/2016  . Moderna SARS-COVID-2 Vaccination 03/07/2020, 04/04/2020  . Pneumococcal Polysaccharide-23 07/18/2016    Diabetes She presents for her initial diabetic visit. She has type 2 diabetes mellitus. Onset time: She was diagnosed at approximate age of 23 years. Her disease course has been  worsening. Hypoglycemia symptoms include nervousness/anxiousness. Pertinent negatives for hypoglycemia include no headaches, pallor or tremors. Associated symptoms include fatigue, foot paresthesias, polydipsia and polyuria. Pertinent negatives for diabetes include no chest pain, no polyphagia and no weight loss. There are no hypoglycemic complications. Symptoms are worsening. Diabetic complications include peripheral neuropathy. Risk factors for coronary artery disease include diabetes mellitus, obesity, sedentary lifestyle and dyslipidemia. Current diabetic treatment includes insulin injections and oral agent (monotherapy). She is compliant with treatment most of the time. Her weight is stable. She is following a generally unhealthy diet. When asked about meal planning, she reported none. She has not had a previous visit with a dietitian. She participates in exercise intermittently. Her home blood glucose trend is fluctuating dramatically. Her breakfast blood glucose range is generally 180-200 mg/dl. Her lunch blood glucose range is generally >200 mg/dl. Her dinner blood glucose range is generally >200 mg/dl. Her bedtime blood glucose range is generally >200 mg/dl. (She presents today with her logs showing significantly above target glycemic profile overall (300-400s).  She was treated for URI symptoms with prednisone (stopped prednisone on 9/11 due to hyperglycemia) but her blood glucose has not normalized.  She has had significant weight gain (20+ lbs) since last visit and has had to be hospitalized in the past for fluid overload.  She reports she is taking her insulin as prescribed and even picked up some OTC regular insulin to supplement with her current regimen.  Her A1C on last visit was 8.6%, too early to check at this visit.  She denies any episodes of hypoglycemia.) An ACE inhibitor/angiotensin II receptor blocker is not being taken. She does not see a podiatrist.Eye exam is not current.   Hypertension This is a new problem. The current episode started more than 1 month ago. The problem is unchanged. The  problem is uncontrolled. Associated symptoms include peripheral edema. Pertinent negatives include no chest pain, headaches, palpitations or shortness of breath. There are no associated agents to hypertension. Risk factors for coronary artery disease include diabetes mellitus, dyslipidemia, obesity and sedentary lifestyle. Past treatments include diuretics. The current treatment provides no improvement. There are no compliance problems.      Review of Systems  Constitutional: Positive for fatigue and unexpected weight change. Negative for weight loss.  Eyes: Negative for visual disturbance.  Respiratory: Negative for cough and shortness of breath.   Cardiovascular: Positive for leg swelling. Negative for chest pain and palpitations.       Intermittent. Hx of hospitalizations for fluid overload.  Gastrointestinal: Negative for constipation and diarrhea.  Endocrine: Positive for polydipsia and polyuria. Negative for cold intolerance, heat intolerance and polyphagia.  Musculoskeletal: Negative for arthralgias and myalgias.  Skin: Negative for color change, pallor, rash and wound.  Neurological: Negative for tremors, numbness and headaches.  Psychiatric/Behavioral: The patient is nervous/anxious.     Objective:     BP 133/82 (BP Location: Left Arm, Patient Position: Sitting)   Pulse (!) 117   Wt (!) 302 lb 6.4 oz (137.2 kg)   BMI 44.66 kg/m   Wt Readings from Last 3 Encounters:  07/22/20 (!) 302 lb 6.4 oz (137.2 kg)  07/09/20 285 lb (129.3 kg)  06/13/20 284 lb 6.3 oz (129 kg)    BP Readings from Last 3 Encounters:  07/22/20 133/82  07/09/20 120/80  06/13/20 (!) 151/88    Physical Exam- Limited  Constitutional:  Body mass index is 44.66 kg/m. , not in acute distress, anxious state of mind Eyes:  EOMI, no exophthalmos Neck: Supple Thyroid: No gross  goiter Cardiovascular: tachycardic, no murmers, rubs, or gallops, + 3+ pitting edema to BLE Respiratory: Adequate breathing efforts, no crackles, rales, rhonchi, or wheezing Musculoskeletal: no gross deformities, strength intact in all four extremities, no gross restriction of joint movements Skin:  no rashes, no hyperemia Neurological: no tremor with outstretched hands    CMP ( most recent) CMP     Component Value Date/Time   NA 134 (L) 12/04/2019 1130   K 3.7 12/04/2019 1130   CL 99 12/04/2019 1130   CO2 24 12/04/2019 1130   GLUCOSE 312 (H) 12/04/2019 1130   BUN 8 12/06/2019 0000   CREATININE 0.6 12/06/2019 0000   CREATININE 0.68 12/04/2019 1130   CALCIUM 9.0 12/04/2019 1130   PROT 7.4 12/04/2019 1130   ALBUMIN 3.8 12/04/2019 1130   AST 42 (H) 12/04/2019 1130   ALT 39 12/04/2019 1130   ALKPHOS 102 12/04/2019 1130   BILITOT 1.1 12/04/2019 1130   GFRNONAA >60 12/04/2019 1130   GFRAA >60 12/04/2019 1130     Diabetic Labs (most recent): Lab Results  Component Value Date   HGBA1C 8.6 (A) 05/28/2020   HGBA1C 10.4 12/06/2019     Lipid Panel ( most recent) Lipid Panel     Component Value Date/Time   CHOL 197 12/06/2019 0000   TRIG 134 12/06/2019 0000   HDL 39 12/06/2019 0000   LDLCALC 133 12/06/2019 0000      Lab Results  Component Value Date   TSH 1.690 08/22/2010   FREET4 1.11 08/22/2010      Assessment & Plan:   1. Type 2 diabetes mellitus with hyperglycemia, without long-term current use of insulin (HCC)   - Jaime [name] has currently uncontrolled symptomatic type 2 DM since  34 years of age.  She presents  today with her logs showing significantly above target glycemic profile overall (300-400s).  She was treated for URI symptoms with prednisone (stopped prednisone on 9/11 due to hyperglycemia) but her blood glucose has not normalized.  She has had significant weight gain (20+ lbs) since last visit and has had to be hospitalized in the past for fluid  overload.  She reports she is taking her insulin as prescribed and even picked up some OTC regular insulin to supplement with her current regimen.  Her methadone was also decreased since she was last seen here.  Her A1C on last visit was 8.6%, too early to check at this visit.  She denies any episodes of hypoglycemia.  She admits to bad eating habits including consumption of sugary beverages such as sweet tea and lemonade.  - I had a long discussion with her about the progressive nature of diabetes and the pathology behind its complications. -her diabetes is complicated by obesity/sedentary life and she remains at a high risk for more acute and chronic complications which include CAD, CVA, CKD, retinopathy, and neuropathy. These are all discussed in detail with her.  - The patient admits there is a room for improvement in their diet and drink choices. -  Suggestion is made for the patient to avoid simple carbohydrates from their diet including Cakes, Sweet Desserts / Pastries, Ice Cream, Soda (diet and regular), Sweet Tea, Candies, Chips, Cookies, Sweet Pastries,  Store Bought Juices, Alcohol in Excess of  1-2 drinks a day, Artificial Sweeteners, Coffee Creamer, and "Sugar-free" Products. This will help patient to have stable blood glucose profile and potentially avoid unintended weight gain.   - I encouraged the patient to switch to  unprocessed or minimally processed complex starch and increased protein intake (animal or plant source), fruits, and vegetables.   - Patient is advised to stick to a routine mealtimes to eat 3 meals  a day and avoid unnecessary snacks ( to snack only to correct hypoglycemia).  - I have approached her with the following individualized plan to manage  her diabetes and patient agrees:   -Her Humulin 70/30 was increased to 90 units BID with meals, in between visits for hyperglycemia r/t prednisone use.  She is advised to continue current dose for now and avoid adjusting her  insulin (both prescribed and the OTC she purchased) without proper guidance from provider. -She will tolerate increase in her Metformin to 1000 mg po twice daily with meals and an increase in her Victoza to 1.8 SQ daily.  May consider adding low dose glipizide on subsequent visits if glycemic profile still above target.  -This patient could greatly benefit from CGM device.  Dexcom G6 sample given from office, Rx sent to pharmacy.  She is encouraged to monitor blood glucose at least 4 times per day, before meals and at bedtime and call clinic if blood glucose levels are less than 70 or greater than 300 for 3 tests in a row.   - Specific targets for  A1c;  LDL, HDL,  and Triglycerides were discussed with the patient.  2) Blood Pressure /Hypertension: Her blood pressure is controlled to target.  She is advised to continue Lasix 40 mg daily, Spironolactone 100 mg po daily, lisinopril 5 mg po daily, and Torsemide 40 mg po daily.  She is encouraged to reach back out to her PCP or GI MD regarding her recent weight gain (suspect fluid overload) for fluid management.  She is also encouraged to keep a daily log of  her weights.  3) Lipids/Hyperlipidemia:   Review of her recent lipid profile from 12/06/19 shows uncontrolled LDL at 133.  She is not on any statin medications.  She is willing to work on lifestyle modifications and diet.  Will consider adding statin medication if lipids continue to be elevated on subsequent visits.  4)  Weight/Diet:   Body mass index is 44.66 kg/m.  -   clearly complicating her diabetes care.   she is  a candidate for modest weight loss. I discussed with her the fact that loss of 5 - 10% of her  current body weight will have the most impact on her diabetes management.  Exercise, and detailed carbohydrates information provided  -  detailed on discharge instructions.  5) Chronic Care/Health Maintenance: -she is on ACE medications and is encouraged to initiate and continue to follow up  with Ophthalmology, Dentist,  Podiatrist at least yearly or according to recommendations, and advised to   stay away from smoking. I have recommended yearly flu vaccine and pneumonia vaccine at least every 5 years; moderate intensity exercise for up to 150 minutes weekly; and  sleep for at least 7 hours a day.  - she is  advised to maintain close follow up with Rosita Fire, MD for primary care needs, as well as her other providers for optimal and coordinated care.   - Time spent on this patient care encounter:  35 min, of which > 50% was spent in  counseling and the rest reviewing her blood glucose logs , discussing her hypoglycemia and hyperglycemia episodes, reviewing her current and  previous labs / studies  ( including abstraction from other facilities) and medications  doses and developing a  long term treatment plan and documenting her care.   Please refer to Patient Instructions for Blood Glucose Monitoring and Insulin/Medications Dosing Guide"  in media tab for additional information. Please  also refer to " Patient Self Inventory" in the Media  tab for reviewed elements of pertinent patient history.  [name] participated in the discussions, expressed understanding, and voiced agreement with the above plans.  All questions were answered to her satisfaction. she is encouraged to contact clinic should she have any questions or concerns prior to her return visit.  Follow up plan: - Return in about 10 days (around 08/01/2020) for Diabetes follow up, Previsit labs.  Rayetta Pigg, [facility] [address] [place], [place] [address] Phone: [phone] Fax: [phone]   07/22/2020, 11:22 AM  This note was partially dictated with voice recognition software. Similar sounding words can be transcribed inadequately or may not  be corrected upon review.

## 2020-07-23 ENCOUNTER — Telehealth: Payer: Self-pay | Admitting: "Endocrinology

## 2020-07-23 MED ORDER — PEN NEEDLES 32G X 6 MM MISC: 1.0000 | Freq: Three times a day (TID) | 3 refills | Status: DC

## 2020-07-23 NOTE — Telephone Encounter (Signed)
Rx for pen needles sent to [facility].

## 2020-07-23 NOTE — Telephone Encounter (Signed)
Pt is requesting a refill on her pen needles for insulin.  [place], [place] - [id number] [place] [address] [place] Phone:  [phone]  Fax:  [phone]

## 2020-07-24 LAB — T4, FREE: Free T4: 0.72 ng/dL — ABNORMAL LOW (ref 0.82–1.77)

## 2020-07-24 LAB — COMPREHENSIVE METABOLIC PANEL
ALT: 43 IU/L — ABNORMAL HIGH (ref 0–32)
AST: 41 IU/L — ABNORMAL HIGH (ref 0–40)
Albumin/Globulin Ratio: 1.1 — ABNORMAL LOW (ref 1.2–2.2)
Albumin: 3.3 g/dL — ABNORMAL LOW (ref 3.8–4.8)
Alkaline Phosphatase: 124 IU/L — ABNORMAL HIGH (ref 44–121)
BUN/Creatinine Ratio: 15 (ref 9–23)
BUN: 8 mg/dL (ref 6–20)
Bilirubin Total: 0.5 mg/dL (ref 0.0–1.2)
CO2: 21 mmol/L (ref 20–29)
Calcium: 9 mg/dL (ref 8.7–10.2)
Chloride: 106 mmol/L (ref 96–106)
Creatinine, Ser: 0.54 mg/dL — ABNORMAL LOW (ref 0.57–1.00)
GFR calc Af Amer: 140 mL/min/{1.73_m2} (ref >59)
GFR calc non Af Amer: 122 mL/min/{1.73_m2} (ref >59)
Globulin, Total: 3 g/dL (ref 1.5–4.5)
Glucose: 75 mg/dL (ref 65–99)
Potassium: 3.2 mmol/L — ABNORMAL LOW (ref 3.5–5.2)
Sodium: 143 mmol/L (ref 134–144)
Total Protein: 6.3 g/dL (ref 6.0–8.5)

## 2020-07-24 LAB — TSH: TSH: 1.99 u[IU]/mL (ref 0.450–4.500)

## 2020-07-24 LAB — LIPID PANEL
Chol/HDL Ratio: 3.5 ratio (ref 0.0–4.4)
Cholesterol, Total: 210 mg/dL — ABNORMAL HIGH (ref 100–199)
HDL: 60 mg/dL (ref >39)
LDL Chol Calc (NIH): 116 mg/dL — ABNORMAL HIGH (ref 0–99)
Triglycerides: 197 mg/dL — ABNORMAL HIGH (ref 0–149)
VLDL Cholesterol Cal: 34 mg/dL (ref 5–40)

## 2020-07-25 ENCOUNTER — Telehealth: Payer: Self-pay | Admitting: Nurse Practitioner

## 2020-07-25 NOTE — Telephone Encounter (Signed)
Pt states she's experiencing her BG dropping. She's taking metformin 1034m 2 times daily, Humulin 70/30 90 units 2 times daily and victoza 1.85mdaily. Pt's BG readings for: Monday 215 at breakfast               90 at lunch               219 at supper               327 at bedtime Tuesday 62 she had been fasting for lab work.      54 at lunch                151 at supper                167 at bedtime Wednesday 85 at breakfast                     107 at lunch                     141 at supper                     218 at bedtime Thursday 100 this morning at 7:00 took her Humulin 70/30 90 units and at 8:45 BG was 85. She stated she had not eaten breakfast yet. I did advise her to go ahead and eat.

## 2020-07-25 NOTE — Telephone Encounter (Signed)
FYI

## 2020-07-25 NOTE — Telephone Encounter (Signed)
Called patient and had to leave a voicemail to advise of the instructions.

## 2020-07-25 NOTE — Telephone Encounter (Signed)
Pt is calling about her sugar levels dropping through out the day and would like a call back from nurse to discuss medication.

## 2020-07-25 NOTE — Telephone Encounter (Signed)
Advise her to cut back on her night time dose of 70/30 to 80 units and keep all others the same.  That should prevent her waking up with low sugars.  The others readings are much better.  Tell her to keep up the good work!

## 2020-07-25 NOTE — Telephone Encounter (Signed)
Pt called and stated she received Jaime [name] message and also that she has gained 16 pounds of fluid since Monday, and that her PCP is running lab work to see if she is having congestive heart failure. Pt states she will go to ER if she gains more weight or has SOB as told by her pcp.

## 2020-07-29 ENCOUNTER — Emergency Department (HOSPITAL_COMMUNITY): Payer: Medicaid Other

## 2020-07-29 ENCOUNTER — Encounter (HOSPITAL_COMMUNITY): Payer: Self-pay | Admitting: *Deleted

## 2020-07-29 ENCOUNTER — Inpatient Hospital Stay (HOSPITAL_COMMUNITY)
Admission: EM | Admit: 2020-07-29 | Discharge: 2020-08-01 | DRG: 291 | Disposition: A | Discharge status: Home / Self Care | Payer: Medicaid Other | Attending: Family Medicine | Admitting: Family Medicine

## 2020-07-29 ENCOUNTER — Other Ambulatory Visit: Payer: Self-pay

## 2020-07-29 DIAGNOSIS — Z79899 Other long term (current) drug therapy: Secondary | ICD-10-CM

## 2020-07-29 DIAGNOSIS — Z888 Allergy status to other drugs, medicaments and biological substances status: Secondary | ICD-10-CM

## 2020-07-29 DIAGNOSIS — J449 Chronic obstructive pulmonary disease, unspecified: Secondary | ICD-10-CM | POA: Diagnosis present

## 2020-07-29 DIAGNOSIS — R0600 Dyspnea, unspecified: Secondary | ICD-10-CM | POA: Diagnosis present

## 2020-07-29 DIAGNOSIS — Z20822 Contact with and (suspected) exposure to covid-19: Secondary | ICD-10-CM | POA: Diagnosis present

## 2020-07-29 DIAGNOSIS — D61818 Other pancytopenia: Secondary | ICD-10-CM | POA: Diagnosis present

## 2020-07-29 DIAGNOSIS — R0602 Shortness of breath: Secondary | ICD-10-CM | POA: Diagnosis not present

## 2020-07-29 DIAGNOSIS — Z83438 Family history of other disorder of lipoprotein metabolism and other lipidemia: Secondary | ICD-10-CM

## 2020-07-29 DIAGNOSIS — Z23 Encounter for immunization: Secondary | ICD-10-CM

## 2020-07-29 DIAGNOSIS — K7460 Unspecified cirrhosis of liver: Secondary | ICD-10-CM

## 2020-07-29 DIAGNOSIS — Z8249 Family history of ischemic heart disease and other diseases of the circulatory system: Secondary | ICD-10-CM | POA: Diagnosis not present

## 2020-07-29 DIAGNOSIS — Z7982 Long term (current) use of aspirin: Secondary | ICD-10-CM | POA: Diagnosis not present

## 2020-07-29 DIAGNOSIS — Z886 Allergy status to analgesic agent status: Secondary | ICD-10-CM

## 2020-07-29 DIAGNOSIS — K7031 Alcoholic cirrhosis of liver with ascites: Secondary | ICD-10-CM | POA: Diagnosis present

## 2020-07-29 DIAGNOSIS — Z885 Allergy status to narcotic agent status: Secondary | ICD-10-CM | POA: Diagnosis not present

## 2020-07-29 DIAGNOSIS — I5031 Acute diastolic (congestive) heart failure: Secondary | ICD-10-CM | POA: Diagnosis present

## 2020-07-29 DIAGNOSIS — R188 Other ascites: Secondary | ICD-10-CM | POA: Diagnosis not present

## 2020-07-29 DIAGNOSIS — B1920 Unspecified viral hepatitis C without hepatic coma: Secondary | ICD-10-CM | POA: Diagnosis present

## 2020-07-29 DIAGNOSIS — R609 Edema, unspecified: Secondary | ICD-10-CM

## 2020-07-29 DIAGNOSIS — K7011 Alcoholic hepatitis with ascites: Secondary | ICD-10-CM

## 2020-07-29 DIAGNOSIS — Z91041 Radiographic dye allergy status: Secondary | ICD-10-CM

## 2020-07-29 DIAGNOSIS — F419 Anxiety disorder, unspecified: Secondary | ICD-10-CM | POA: Diagnosis present

## 2020-07-29 DIAGNOSIS — I110 Hypertensive heart disease with heart failure: Secondary | ICD-10-CM | POA: Diagnosis present

## 2020-07-29 DIAGNOSIS — D731 Hypersplenism: Secondary | ICD-10-CM | POA: Diagnosis present

## 2020-07-29 DIAGNOSIS — R162 Hepatomegaly with splenomegaly, not elsewhere classified: Secondary | ICD-10-CM | POA: Diagnosis present

## 2020-07-29 DIAGNOSIS — Z6841 Body Mass Index (BMI) 40.0 and over, adult: Secondary | ICD-10-CM

## 2020-07-29 DIAGNOSIS — E1140 Type 2 diabetes mellitus with diabetic neuropathy, unspecified: Secondary | ICD-10-CM | POA: Diagnosis present

## 2020-07-29 DIAGNOSIS — E876 Hypokalemia: Secondary | ICD-10-CM | POA: Diagnosis present

## 2020-07-29 DIAGNOSIS — F17210 Nicotine dependence, cigarettes, uncomplicated: Secondary | ICD-10-CM | POA: Diagnosis present

## 2020-07-29 DIAGNOSIS — Z794 Long term (current) use of insulin: Secondary | ICD-10-CM

## 2020-07-29 DIAGNOSIS — Z881 Allergy status to other antibiotic agents status: Secondary | ICD-10-CM

## 2020-07-29 LAB — HEPATIC FUNCTION PANEL
ALT: 35 U/L (ref 0–44)
AST: 36 U/L (ref 15–41)
Albumin: 3.1 g/dL — ABNORMAL LOW (ref 3.5–5.0)
Alkaline Phosphatase: 112 U/L (ref 38–126)
Bilirubin, Direct: 0.1 mg/dL (ref 0.0–0.2)
Indirect Bilirubin: 0.5 mg/dL (ref 0.3–0.9)
Total Bilirubin: 0.6 mg/dL (ref 0.3–1.2)
Total Protein: 6.8 g/dL (ref 6.5–8.1)

## 2020-07-29 LAB — URINALYSIS, ROUTINE W REFLEX MICROSCOPIC
Bilirubin Urine: NEGATIVE
Glucose, UA: NEGATIVE mg/dL
Hgb urine dipstick: NEGATIVE
Ketones, ur: NEGATIVE mg/dL
Leukocytes,Ua: NEGATIVE
Nitrite: NEGATIVE
Protein, ur: NEGATIVE mg/dL
Specific Gravity, Urine: 1.038 — ABNORMAL HIGH (ref 1.005–1.030)
pH: 5 (ref 5.0–8.0)

## 2020-07-29 LAB — CBC
HCT: 34 % — ABNORMAL LOW (ref 36.0–46.0)
Hemoglobin: 10.6 g/dL — ABNORMAL LOW (ref 12.0–15.0)
MCH: 29 pg (ref 26.0–34.0)
MCHC: 31.2 g/dL (ref 30.0–36.0)
MCV: 92.9 fL (ref 80.0–100.0)
Platelets: 76 10*3/uL — ABNORMAL LOW (ref 150–400)
RBC: 3.66 MIL/uL — ABNORMAL LOW (ref 3.87–5.11)
RDW: 16 % — ABNORMAL HIGH (ref 11.5–15.5)
WBC: 2.3 10*3/uL — ABNORMAL LOW (ref 4.0–10.5)
nRBC: 0 % (ref 0.0–0.2)

## 2020-07-29 LAB — BASIC METABOLIC PANEL
Anion gap: 9 (ref 5–15)
BUN: 11 mg/dL (ref 6–20)
CO2: 23 mmol/L (ref 22–32)
Calcium: 8.6 mg/dL — ABNORMAL LOW (ref 8.9–10.3)
Chloride: 106 mmol/L (ref 98–111)
Creatinine, Ser: 0.44 mg/dL (ref 0.44–1.00)
GFR calc Af Amer: >60 mL/min (ref >60)
GFR calc non Af Amer: >60 mL/min (ref >60)
Glucose, Bld: 129 mg/dL — ABNORMAL HIGH (ref 70–99)
Potassium: 4 mmol/L (ref 3.5–5.1)
Sodium: 138 mmol/L (ref 135–145)

## 2020-07-29 LAB — TROPONIN I (HIGH SENSITIVITY): Troponin I (High Sensitivity): 7 ng/L (ref <18)

## 2020-07-29 LAB — MAGNESIUM: Magnesium: 1.5 mg/dL — ABNORMAL LOW (ref 1.7–2.4)

## 2020-07-29 LAB — RESPIRATORY PANEL BY RT PCR (FLU A&B, COVID)
Influenza A by PCR: NEGATIVE
Influenza B by PCR: NEGATIVE
SARS Coronavirus 2 by RT PCR: NEGATIVE

## 2020-07-29 LAB — CBG MONITORING, ED
Glucose-Capillary: 63 mg/dL — ABNORMAL LOW (ref 70–99)
Glucose-Capillary: 73 mg/dL (ref 70–99)
Glucose-Capillary: 95 mg/dL (ref 70–99)

## 2020-07-29 LAB — BRAIN NATRIURETIC PEPTIDE: B Natriuretic Peptide: 26 pg/mL (ref 0.0–100.0)

## 2020-07-29 LAB — PREGNANCY, URINE: Preg Test, Ur: NEGATIVE

## 2020-07-29 LAB — RAPID URINE DRUG SCREEN, HOSP PERFORMED
Amphetamines: NOT DETECTED
Barbiturates: NOT DETECTED
Benzodiazepines: NOT DETECTED
Cocaine: NOT DETECTED
Opiates: NOT DETECTED
Tetrahydrocannabinol: NOT DETECTED

## 2020-07-29 LAB — PROTIME-INR
INR: 1 (ref 0.8–1.2)
Prothrombin Time: 13 seconds (ref 11.4–15.2)

## 2020-07-29 LAB — POC URINE PREG, ED: Preg Test, Ur: NEGATIVE

## 2020-07-29 LAB — HEMOGLOBIN A1C
Hgb A1c MFr Bld: 8 % — ABNORMAL HIGH (ref 4.8–5.6)
Mean Plasma Glucose: 182.9 mg/dL

## 2020-07-29 LAB — HIV ANTIBODY (ROUTINE TESTING W REFLEX): HIV Screen 4th Generation wRfx: NONREACTIVE

## 2020-07-29 MED ORDER — SUCRALFATE 1 G PO TABS
1.0000 g | ORAL_TABLET | Freq: Four times a day (QID) | ORAL | Status: DC
  Administered 2020-07-29 – 2020-08-01 (×11): 1 g via ORAL
  Filled 2020-07-29 (×11): qty 1

## 2020-07-29 MED ORDER — MORPHINE SULFATE (PF) 2 MG/ML IV SOLN
2.0000 mg | Freq: Once | INTRAVENOUS | Status: AC
  Administered 2020-07-29: 2 mg via INTRAVENOUS
  Filled 2020-07-29: qty 1

## 2020-07-29 MED ORDER — ENOXAPARIN SODIUM 80 MG/0.8ML ~~LOC~~ SOLN
70.0000 mg | SUBCUTANEOUS | Status: DC
  Administered 2020-07-29 – 2020-07-31 (×2): 70 mg via SUBCUTANEOUS
  Filled 2020-07-29 (×3): qty 0.8

## 2020-07-29 MED ORDER — INSULIN ASPART 100 UNIT/ML ~~LOC~~ SOLN
0.0000 [IU] | Freq: Three times a day (TID) | SUBCUTANEOUS | Status: DC
  Administered 2020-07-31: 2 [IU] via SUBCUTANEOUS
  Administered 2020-07-31: 1 [IU] via SUBCUTANEOUS
  Administered 2020-08-01: 2 [IU] via SUBCUTANEOUS
  Administered 2020-08-01: 3 [IU] via SUBCUTANEOUS

## 2020-07-29 MED ORDER — ENOXAPARIN SODIUM 40 MG/0.4ML ~~LOC~~ SOLN: 40.0000 mg | SUBCUTANEOUS | Status: DC

## 2020-07-29 MED ORDER — QUETIAPINE FUMARATE 25 MG PO TABS
25.0000 mg | ORAL_TABLET | Freq: Every day | ORAL | Status: DC
  Administered 2020-07-29 – 2020-07-31 (×3): 25 mg via ORAL
  Filled 2020-07-29 (×3): qty 1

## 2020-07-29 MED ORDER — FUROSEMIDE 10 MG/ML IJ SOLN
60.0000 mg | Freq: Two times a day (BID) | INTRAMUSCULAR | Status: DC
  Administered 2020-07-30 – 2020-08-01 (×5): 60 mg via INTRAVENOUS
  Filled 2020-07-29 (×5): qty 6

## 2020-07-29 MED ORDER — ONDANSETRON HCL 4 MG PO TABS: 4.0000 mg | ORAL_TABLET | Freq: Four times a day (QID) | ORAL | Status: DC | PRN

## 2020-07-29 MED ORDER — FUROSEMIDE 10 MG/ML IJ SOLN
80.0000 mg | INTRAMUSCULAR | Status: AC
  Administered 2020-07-29: 80 mg via INTRAVENOUS
  Filled 2020-07-29: qty 8

## 2020-07-29 MED ORDER — PREGABALIN 75 MG PO CAPS
75.0000 mg | ORAL_CAPSULE | Freq: Two times a day (BID) | ORAL | Status: DC
  Administered 2020-07-29 – 2020-08-01 (×6): 75 mg via ORAL
  Filled 2020-07-29 (×7): qty 1

## 2020-07-29 MED ORDER — SPIRONOLACTONE 25 MG PO TABS
50.0000 mg | ORAL_TABLET | Freq: Every day | ORAL | Status: DC
  Administered 2020-07-30 – 2020-08-01 (×3): 50 mg via ORAL
  Filled 2020-07-29 (×2): qty 1
  Filled 2020-07-29 (×2): qty 2

## 2020-07-29 MED ORDER — LORAZEPAM 2 MG/ML IJ SOLN
1.0000 mg | Freq: Once | INTRAMUSCULAR | Status: AC
  Administered 2020-07-29: 1 mg via INTRAVENOUS
  Filled 2020-07-29: qty 1

## 2020-07-29 MED ORDER — METHADONE HCL 10 MG PO TABS
15.0000 mg | ORAL_TABLET | Freq: Every morning | ORAL | Status: DC
  Administered 2020-08-01: 15 mg via ORAL
  Filled 2020-07-29 (×3): qty 2

## 2020-07-29 MED ORDER — SODIUM CHLORIDE 0.9 % IV SOLN: 250.0000 mL | INTRAVENOUS | Status: DC | PRN

## 2020-07-29 MED ORDER — CLONAZEPAM 0.5 MG PO TABS
0.5000 mg | ORAL_TABLET | Freq: Two times a day (BID) | ORAL | Status: DC | PRN
  Administered 2020-07-29: 0.5 mg via ORAL
  Filled 2020-07-29: qty 1

## 2020-07-29 MED ORDER — VENLAFAXINE HCL ER 75 MG PO CP24
225.0000 mg | ORAL_CAPSULE | Freq: Every day | ORAL | Status: DC
  Administered 2020-07-30 – 2020-08-01 (×3): 225 mg via ORAL
  Filled 2020-07-29: qty 3
  Filled 2020-07-29: qty 6
  Filled 2020-07-29: qty 3

## 2020-07-29 MED ORDER — HYDROMORPHONE HCL 1 MG/ML IJ SOLN
1.0000 mg | INTRAMUSCULAR | Status: DC | PRN
  Administered 2020-07-29 – 2020-08-01 (×18): 1 mg via INTRAVENOUS
  Filled 2020-07-29 (×18): qty 1

## 2020-07-29 MED ORDER — SODIUM CHLORIDE 0.9% FLUSH
3.0000 mL | Freq: Two times a day (BID) | INTRAVENOUS | Status: DC
  Administered 2020-07-29 – 2020-08-01 (×5): 3 mL via INTRAVENOUS

## 2020-07-29 MED ORDER — SODIUM CHLORIDE 0.9% FLUSH: 3.0000 mL | INTRAVENOUS | Status: DC | PRN

## 2020-07-29 MED ORDER — PANTOPRAZOLE SODIUM 40 MG PO TBEC
40.0000 mg | DELAYED_RELEASE_TABLET | Freq: Two times a day (BID) | ORAL | Status: DC
  Administered 2020-07-29 – 2020-08-01 (×6): 40 mg via ORAL
  Filled 2020-07-29 (×6): qty 1

## 2020-07-29 MED ORDER — FLUCONAZOLE 100 MG PO TABS
400.0000 mg | ORAL_TABLET | Freq: Every day | ORAL | Status: DC
  Administered 2020-07-29 – 2020-08-01 (×4): 400 mg via ORAL
  Filled 2020-07-29 (×2): qty 4
  Filled 2020-07-29 (×2): qty 1

## 2020-07-29 MED ORDER — MAGNESIUM SULFATE 2 GM/50ML IV SOLN
2.0000 g | INTRAVENOUS | Status: AC
  Administered 2020-07-29: 2 g via INTRAVENOUS
  Filled 2020-07-29: qty 50

## 2020-07-29 MED ORDER — ZOLPIDEM TARTRATE 5 MG PO TABS: 5.0000 mg | ORAL_TABLET | Freq: Every evening | ORAL | Status: DC | PRN

## 2020-07-29 MED ORDER — ONDANSETRON HCL 4 MG/2ML IJ SOLN: 4.0000 mg | Freq: Four times a day (QID) | INTRAMUSCULAR | Status: DC | PRN

## 2020-07-29 NOTE — ED Provider Notes (Addendum)
[facility] EMERGENCY DEPARTMENT Provider Note   CSN: [id number] Arrival date & time: 07/29/20  1055     History Chief Complaint  Patient presents with   Chest Pain    Jaime [name] is a 34 y.o. female.  HPI   Patient is a 34 year old female, she has a known history of cirrhosis of the liver secondary to a history of IV drug use, hepatitis C (status post treatment).  She is a known diabetic, insulin requiring, she also has some COPD and a stomach ulcer.  She presents to the hospital today with multiple complaints including shortness of breath and chest pain as well as severe peripheral edema, abdominal distention and anasarca.  She was most recently evaluated and admitted at an outside hospital where she was given some diuresis, had a VQ scan which was low probability for pulmonary embolism and was discharged home.  She states that she did not lose any weight and has continued to swell getting over 10 pounds since discharge.  At this time the patient complains of ongoing shortness of breath which is persistent, severe, she was told to come here for pulmonary embolism evaluation with a CT angiogram by her family doctor.  This patient reports that she has struggled with the peripheral edema over time and often requires admission to the hospital for diuresis stating that she redoes require high-dose Lasix for several days and then feels much better.  She reports that she did not get very much Lasix in the other hospital and thus did not improve at all.  I have reviewed the prior medical history including the medical records from her outpatient visit, the VQ scan reported that there was no signs of pulmonary embolism.  Past Medical History:  Diagnosis Date   Anxiety    Asthma    Chronic abdominal pain    Chronic back pain    Cirrhosis ([place])    Cirrhosis ([place])    COPD (chronic obstructive pulmonary disease) ([place])    Depression    Diabetes mellitus without complication ([place])     Diabetes mellitus, type II ([place])    Hepatitis C    Insomnia    Long-term current use of methadone for opiate dependence ([place])    Lupus ([place])    Migraine headache    Neuropathy    Nocturnal seizures ([place])    Peptic ulcer    Spleen enlarged     Patient Active Problem List   Diagnosis Date Noted   Nexplanon insertion 05/20/2020   Nexplanon removal 05/20/2020   Pressure injury of skin 01/29/2019   Uncontrolled diabetes mellitus ([place]) 09/19/2018   Hyperglycemia 09/17/2018   Acute respiratory failure with hypoxia ([place]) 09/15/2018   Anxiety 09/15/2018   Chronic abdominal pain 09/15/2018   Diabetes mellitus without complication ([place]) 74/06/1447   Hepatitis C 09/15/2018   Peptic ulcer 09/15/2018   Long-term current use of methadone for opiate dependence ([place]) 09/15/2018   Cirrhosis ([place]) 09/15/2018   Acute hypoxemic respiratory failure ([place]) 07/17/2016   Pleural effusion on right 07/17/2016   Multiple rib fractures involving four or more ribs 07/17/2016   Hemothorax on right 07/17/2016   Chronic pain disorder 07/17/2016    Past Surgical History:  Procedure Laterality Date   CHOLECYSTECTOMY       OB History    Gravida  0   Para  0   Term  0   Preterm  0   AB  0   Living  0  SAB  0   TAB  0   Ectopic  0   Multiple  0   Live Births  0           Family History  Problem Relation Age of Onset   Hypertension Mother    Hyperlipidemia Mother    Hyperlipidemia Maternal Grandfather     Social History   Tobacco Use   Smoking status: Current Every Day Smoker    Packs/day: 0.00    Years: 5.00    Pack years: 0.00    Types: E-cigarettes   Smokeless tobacco: Never Used  Scientific laboratory technician Use: Every day   Substances: Nicotine, Flavoring  Substance Use Topics   Alcohol use: No   Drug use: No    Home Medications Prior to Admission medications   Medication Sig Start Date End Date Taking? Authorizing Provider   Accu-Chek Softclix Lancets lancets 4 (four) times daily. 07/07/20   [provider]  albuterol (PROVENTIL) (2.5 MG/3ML) 0.083% nebulizer solution Take 2.5 mg by nebulization every 6 (six) hours as needed for wheezing or shortness of breath.    [provider]  aspirin EC 81 MG tablet Take 162 mg by mouth daily. Swallow whole.    [provider]  benzonatate (TESSALON) 100 MG capsule Take 1 capsule (100 mg total) by mouth every 8 (eight) hours. 07/09/20   Avegno, Darrelyn Hillock, FNP  cetirizine (ZYRTEC ALLERGY) 10 MG tablet Take 1 tablet (10 mg total) by mouth daily. 07/09/20   Avegno, Darrelyn Hillock, FNP  clonazePAM (KLONOPIN) 0.5 MG tablet Take 1 tablet (0.5 mg total) by mouth 2 (two) times daily. 08/07/20 09/06/20  Norman Clay, MD  Continuous Blood Gluc Sensor (DEXCOM G6 SENSOR) MISC 4 Pieces by Does not apply route once a week. 07/22/20   Brita Romp, NP  Continuous Blood Gluc Transmit (DEXCOM G6 TRANSMITTER) MISC 1 Piece by Does not apply route as directed. 07/22/20   Brita Romp, NP  cyclobenzaprine (FLEXERIL) 10 MG tablet Take 1 tablet (10 mg total) by mouth at bedtime. 06/13/20   Wurst, [place], PA-C  diclofenac Sodium (VOLTAREN) 1 % GEL Apply topically. 08/13/18   [provider]  etonogestrel (NEXPLANON) 68 MG IMPL implant 1 each by Subdermal route once.    [provider]  fluconazole (DIFLUCAN) 200 MG tablet Take 400 mg by mouth daily. 06/18/20   [provider]  fluticasone (FLONASE) 50 MCG/ACT nasal spray Place 1 spray into both nostrils daily for 14 days. 07/09/20 07/23/20  Avegno, Darrelyn Hillock, FNP  furosemide (LASIX) 40 MG tablet Take 1 tablet by mouth daily. 05/22/20   [provider]  glucose blood (ACCU-CHEK GUIDE) test strip Use as instructed to check blood glucose four times daily 06/07/20   Cassandria Anger, MD  hyoscyamine (LEVSIN SL) 0.125 MG SL tablet Place under the tongue every 6 (six) hours as needed. 06/15/20    [provider]  insulin isophane & regular human (HUMULIN 70/30 MIX) (70-30) 100 UNIT/ML KwikPen Inject 90 Units into the skin 2 (two) times daily with a meal. 07/22/20   Reardon, Juanetta Beets, NP  Insulin Pen Needle (PEN NEEDLES) 32G X 6 MM MISC 1 each by Does not apply route 3 (three) times daily. 07/23/20   Cassandria Anger, MD  liraglutide (VICTOZA) 18 MG/3ML SOPN Inject 1.8 mg into the skin daily. 07/22/20   Brita Romp, NP  lisinopril (ZESTRIL) 5 MG tablet Take 1 tablet (5 mg total) by  mouth daily. 06/06/20   Brita Romp, NP  metFORMIN (GLUCOPHAGE) 1000 MG tablet Take 1 tablet (1,000 mg total) by mouth 2 (two) times daily with a meal. 07/22/20   Brita Romp, NP  methadone (DOLOPHINE) 10 MG/5ML solution Take 15 mg by mouth every morning.     [provider]  NOVOLOG FLEXPEN 100 UNIT/ML FlexPen SMARTSIG:10 Unit(s) SUB-Q 06/19/20   [provider]  nystatin (MYCOSTATIN) 100000 UNIT/ML suspension Take 5 mLs (500,000 Units total) by mouth 4 (four) times daily. 06/13/20   Wurst, [place], PA-C  ondansetron (ZOFRAN-ODT) 4 MG disintegrating tablet Take 4 mg by mouth every 8 (eight) hours as needed. 06/14/20   [provider]  pantoprazole (PROTONIX) 40 MG tablet Take 40 mg by mouth 2 (two) times daily. 07/05/20   [provider]  potassium chloride (KLOR-CON) 10 MEQ tablet Take 1 tablet by mouth daily. 04/24/20   [provider]  predniSONE (DELTASONE) 10 MG tablet Take 2 tablets (20 mg total) by mouth daily. Patient not taking: Reported on 07/22/2020 07/09/20   Emerson Monte, FNP  pregabalin (LYRICA) 75 MG capsule Take 75 mg by mouth 2 (two) times daily.    [provider]  QUEtiapine (SEROQUEL) 25 MG tablet Take 1 tablet (25 mg total) by mouth at bedtime. 07/19/20   Norman Clay, MD  RESTASIS 0.05 % ophthalmic emulsion Place 1 drop into both eyes as needed.  04/22/19   [provider]  spironolactone (ALDACTONE) 100  MG tablet Take 1 tablet by mouth daily. 05/22/20   [provider]  sucralfate (CARAFATE) 1 g tablet Take 1 g by mouth 4 (four) times daily. 07/02/20   [provider]  tetrahydrozoline 0.05 % ophthalmic solution Place 2 drops into both eyes 2 (two) times daily as needed (allergies/dry/itchy).    [provider]  torsemide (DEMADEX) 20 MG tablet Take 2 tablets by mouth daily. 05/20/20   [provider]  venlafaxine XR (EFFEXOR-XR) 150 MG 24 hr capsule Take total of 225 mg daily (150 mg + 75 mg ) 07/19/20   Norman Clay, MD  zaleplon (SONATA) 10 MG capsule Take 10 mg by mouth at bedtime as needed for sleep.    [provider]    Allergies    Iodine-131, Ivp dye [iodinated diagnostic agents], Ketorolac tromethamine, Tylenol [acetaminophen], Gabapentin, Morphine and related, Nsaids, Suboxone [buprenorphine hcl-naloxone hcl], and Vancomycin  Review of Systems   Review of Systems  All other systems reviewed and are negative.   Physical Exam Updated Vital Signs BP 135/84 (BP Location: Left Arm)    Pulse (!) 132    Temp 98.5 F (36.9 C) (Oral)    Resp (!) 28    Ht 1.753 m (5' 9" )    Wt (!) 137 kg    SpO2 99%    BMI 44.60 kg/m   Physical Exam Vitals and nursing note reviewed.  Constitutional:      General: She is in acute distress.     Appearance: She is well-developed. She is ill-appearing.  HENT:     Head: Normocephalic and atraumatic.     Mouth/Throat:     Mouth: Mucous membranes are moist.     Pharynx: No oropharyngeal exudate.  Eyes:     General: No scleral icterus.       Right eye: No discharge.        Left eye: No discharge.     Conjunctiva/sclera: Conjunctivae normal.     Pupils: Pupils are  equal, round, and reactive to light.  Neck:     Thyroid: No thyromegaly.     Vascular: No JVD.  Cardiovascular:     Rate and Rhythm: Regular rhythm. Tachycardia present.     Heart sounds: Normal heart sounds. No murmur heard.  No friction rub.  No gallop.      Comments: Heart rate is approximately 125 to 130 bpm in sinus rhythm with normal pulses at the radial arteries Pulmonary:     Effort: No respiratory distress.     Breath sounds: Normal breath sounds. No wheezing or rales.     Comments: The patient is tachypneic, she has normal lung sounds, she speaks in shortened sentences Abdominal:     General: Bowel sounds are normal. There is no distension.     Palpations: Abdomen is soft. There is no mass.     Tenderness: There is no abdominal tenderness.  Musculoskeletal:        General: No tenderness. Normal range of motion.     Cervical back: Normal range of motion and neck supple.     Right lower leg: Edema present.     Left lower leg: Edema present.     Comments: Diffuse peripheral edema extending to the level of the umbilicus in a pattern of anasarca, lower extremities with 3+ pitting edema symmetrical  Lymphadenopathy:     Cervical: No cervical adenopathy.  Skin:    General: Skin is warm and dry.     Findings: No erythema or rash.  Neurological:     Mental Status: She is alert.     Coordination: Coordination normal.     Comments: The patient is awake alert and able to follow commands  Psychiatric:        Behavior: Behavior normal.     ED Results / Procedures / Treatments   Labs (all labs ordered are listed, but only abnormal results are displayed) Labs Reviewed  BASIC METABOLIC PANEL - Abnormal; Notable for the following components:      Result Value   Glucose, Bld 129 (*)    Calcium 8.6 (*)    All other components within normal limits  CBC - Abnormal; Notable for the following components:   WBC 2.3 (*)    RBC 3.66 (*)    Hemoglobin 10.6 (*)    HCT 34.0 (*)    RDW 16.0 (*)    Platelets 76 (*)    All other components within normal limits  HEPATIC FUNCTION PANEL - Abnormal; Notable for the following components:   Albumin 3.1 (*)    All other components within normal limits  MAGNESIUM - Abnormal; Notable for  the following components:   Magnesium 1.5 (*)    All other components within normal limits  BRAIN NATRIURETIC PEPTIDE  PROTIME-INR  URINALYSIS, ROUTINE W REFLEX MICROSCOPIC  PREGNANCY, URINE  RAPID URINE DRUG SCREEN, HOSP PERFORMED  POC URINE PREG, ED  TROPONIN I (HIGH SENSITIVITY)    EKG EKG Interpretation  Date/Time:  Monday July 29 2020 11:29:33 EDT Ventricular Rate:  124 PR Interval:  146 QRS Duration: 74 QT Interval:  306 QTC Calculation: 439 R Axis:   40 Text Interpretation: Sinus tachycardia Otherwise normal ECG Since last tracing rate faster Confirmed by Noemi Chapel [phone]) on 07/29/2020 11:43:36 AM   Radiology DG Chest 2 View  Result Date: 07/29/2020 CLINICAL DATA:  Shortness of breath, chest pain EXAM: CHEST - 2 VIEW COMPARISON:  05/26/2019 FINDINGS: The heart size and mediastinal contours are within normal limits.  Slightly low lung volumes with mild bibasilar atelectasis. No focal airspace consolidation, pleural effusion, or pneumothorax. The visualized skeletal structures are unremarkable. IMPRESSION: Slightly low lung volumes with mild bibasilar atelectasis. Otherwise, no acute cardiopulmonary findings. Electronically Signed   By: Davina Poke D.O.   On: 07/29/2020 11:55    Procedures Procedures (including critical care time)  Medications Ordered in ED Medications  magnesium sulfate IVPB 2 g 50 mL (has no administration in time range)  LORazepam (ATIVAN) injection 1 mg (has no administration in time range)  furosemide (LASIX) injection 80 mg (80 mg Intravenous Given 07/29/20 1326)    ED Course  I have reviewed the triage vital signs and the nursing notes.  Pertinent labs & imaging results that were available during my care of the patient were reviewed by me and considered in my medical decision making (see chart for details).    MDM Rules/Calculators/A&P                          I viewed the patient's x-ray, there is no signs of infiltrates or  pneumothorax, she has had a recent evaluation for pulmonary embolism with a VQ scan however her family doctor has measured a D-dimer in the office and recommended that she get a CT angiogram because a D-dimer was elevated.  The patient has known severe cirrhosis which has caused ascites and peripheral edema which is the likely cause and will need diuresis.  Her tachycardia and tachypnea is likely in someway related to her volume overload, she has no history of congestive heart failure that she has known of, she does not have any signs of murmurs however given her history of IV drug use it would raise the suspicion for a cardiac contributing cause as well.  BNP will be ordered, Lasix ordered, labs ordered, CT angiogram will be ordered as well as the patient's renal function is normal.  She is not able to lay supine because of her shortness of breath which may complicate obtaining a CT angiogram.  Given her tachycardia tachypnea and severe edema the patient will likely need to be admitted to the hospital.  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  She does not, doubt any bleeding in her stools, her liver function testing shows a slightly low albumin but otherwise unremarkable.  Troponin was 7, BNP was 26 and the magnesium was low at 1.5  Results were obtained from the other hospital, the VQ scan did not reveal pulmonary embolism, the discharge summary suggested other lab work including a white blood cell count of 3.5 and a hemoglobin of 11.1, very similar to our labs today.  She is chronically thrombocytopenic.  I will discussed the care with the hospitalist due to the patient's ongoing tachycardia and fluid overloaded state.  She has been  given Lasix with some diuresis in the ED.  I have also given her Ativan in case some of the anxiety is driving her tachycardia as well as magnesium to replace her hypomagnesium.  Pt remains tachycardic, CT non con pending Ativan given, IVF will be given in moderation Discuss with hospitalist for admission. - d/w Dr. Darrick Meigs who will admit  Jaime [name]  was evaluated in Emergency Department on 07/29/2020 for the symptoms described in the history of present illness. She was evaluated in the context of the global COVID-19 pandemic, which necessitated consideration that the patient might be at risk for infection with the SARS-CoV-2 virus that causes COVID-19. Institutional protocols and algorithms that pertain to the evaluation of patients at risk for COVID-19 are in a state of rapid change based on information released by regulatory bodies including the CDC and federal and state organizations. These policies and algorithms were followed during the patient's care in the ED.   Final Clinical Impression(s) / ED Diagnoses Final diagnoses:  Peripheral edema  Cirrhosis of liver with ascites, unspecified hepatic cirrhosis type ([place])  Hypokalemia  Pancytopenia ([facility])      Noemi Chapel, MD 07/29/20 1543    Noemi Chapel, MD 07/29/20 [phone]

## 2020-07-29 NOTE — ED Triage Notes (Signed)
Pt c/o chest pain and sob and was told to come to ED due to an elevated d-dimer; pt states she has gained 25lbs in the last week to her abdomen and bilateral lower legs

## 2020-07-29 NOTE — H&P (Signed)
TRH H&P    Patient Demographics:    Jaime [name], is a 34 y.o. female  MRN: [id number]  DOB - 02/25/1986  Admit Date - 07/29/2020  Referring MD/NP/PA: Noemi Chapel  Outpatient Primary MD for the patient is Neale Burly, MD  Patient coming from: Home  Chief complaint-shortness of breath   HPI:    Jaime [name]  is a 34 y.o. 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  Patient was seen at outside hospital and was given Lasix for diuresis.  At that time she also had VQ scan which was negative for pulmonary embolism.  Patient says that she has continued to gain weight since she was discharged from the hospital.  Usually she weighs around 280 pounds, and has gained more than 30 pounds.  She was told by her PCP to go to ED for CT chest without contrast evaluation to rule out PE.   CT chest showed no significant abnormality.  Review of VQ scan from 07/26/2020 showed normal pulmonary study.  No segmental or subsegmental perfusion defects to suggest pulmonary bolus him.  She denies coughing up any phlegm. Denies vomiting any blood. Complains of diarrhea, black stools. Complains of severe shortness of breath, abdominal distention. Chest x-ray was unremarkable.   Review of systems:    In addition to the HPI above,   All other systems reviewed and are negative.    Past History of the following :    Past Medical History:  Diagnosis Date  . Anxiety   . Asthma   . Chronic abdominal pain   . Chronic back pain   . Cirrhosis ([place])   . Cirrhosis ([place])   . COPD (chronic obstructive pulmonary disease) ([place])   . Depression   . Diabetes mellitus without complication ([place])   . Diabetes mellitus, type  II ([place])   . Hepatitis C   . Insomnia   . Long-term current use of methadone for opiate dependence ([place])   . Lupus ([place])   . Migraine headache   . Neuropathy   . Nocturnal seizures ([place])   . Peptic ulcer   . Spleen enlarged       Past Surgical History:  Procedure Laterality Date  . CHOLECYSTECTOMY        Social History:      Social History   Tobacco Use  . Smoking status: Current Every Day Smoker    Packs/day: 0.00    Years: 5.00    Pack years: 0.00    Types: E-cigarettes  . Smokeless tobacco: Never Used  Substance Use Topics  . Alcohol use: No       Family History :     Family History  Problem Relation Age of Onset  . Hypertension Mother   . Hyperlipidemia Mother   . Hyperlipidemia Maternal Grandfather       Home Medications:   Prior to  Admission medications   Medication Sig Start Date End Date Taking? Authorizing Provider  Accu-Chek Softclix Lancets lancets 4 (four) times daily. 07/07/20   [provider]  albuterol (PROVENTIL) (2.5 MG/3ML) 0.083% nebulizer solution Take 2.5 mg by nebulization every 6 (six) hours as needed for wheezing or shortness of breath.    [provider]  aspirin EC 81 MG tablet Take 162 mg by mouth daily. Swallow whole.    [provider]  benzonatate (TESSALON) 100 MG capsule Take 1 capsule (100 mg total) by mouth every 8 (eight) hours. 07/09/20   Avegno, Darrelyn Hillock, FNP  cetirizine (ZYRTEC ALLERGY) 10 MG tablet Take 1 tablet (10 mg total) by mouth daily. 07/09/20   Avegno, Darrelyn Hillock, FNP  clonazePAM (KLONOPIN) 0.5 MG tablet Take 1 tablet (0.5 mg total) by mouth 2 (two) times daily. 08/07/20 09/06/20  Norman Clay, MD  Continuous Blood Gluc Sensor (DEXCOM G6 SENSOR) MISC 4 Pieces by Does not apply route once a week. 07/22/20   Brita Romp, NP  Continuous Blood Gluc Transmit (DEXCOM G6 TRANSMITTER) MISC 1 Piece by Does not apply route as directed. 07/22/20   Brita Romp, NP  cyclobenzaprine  (FLEXERIL) 10 MG tablet Take 1 tablet (10 mg total) by mouth at bedtime. 06/13/20   Wurst, [place], PA-C  diclofenac Sodium (VOLTAREN) 1 % GEL Apply topically. 08/13/18   [provider]  etonogestrel (NEXPLANON) 68 MG IMPL implant 1 each by Subdermal route once.    [provider]  fluconazole (DIFLUCAN) 200 MG tablet Take 400 mg by mouth daily. 06/18/20   [provider]  fluticasone (FLONASE) 50 MCG/ACT nasal spray Place 1 spray into both nostrils daily for 14 days. 07/09/20 07/23/20  Avegno, Darrelyn Hillock, FNP  furosemide (LASIX) 40 MG tablet Take 1 tablet by mouth daily. 05/22/20   [provider]  glucose blood (ACCU-CHEK GUIDE) test strip Use as instructed to check blood glucose four times daily 06/07/20   Cassandria Anger, MD  hyoscyamine (LEVSIN SL) 0.125 MG SL tablet Place under the tongue every 6 (six) hours as needed. 06/15/20   [provider]  insulin isophane & regular human (HUMULIN 70/30 MIX) (70-30) 100 UNIT/ML KwikPen Inject 90 Units into the skin 2 (two) times daily with a meal. 07/22/20   Reardon, Juanetta Beets, NP  Insulin Pen Needle (PEN NEEDLES) 32G X 6 MM MISC 1 each by Does not apply route 3 (three) times daily. 07/23/20   Cassandria Anger, MD  liraglutide (VICTOZA) 18 MG/3ML SOPN Inject 1.8 mg into the skin daily. 07/22/20   Brita Romp, NP  lisinopril (ZESTRIL) 5 MG tablet Take 1 tablet (5 mg total) by mouth daily. 06/06/20   Brita Romp, NP  metFORMIN (GLUCOPHAGE) 1000 MG tablet Take 1 tablet (1,000 mg total) by mouth 2 (two) times daily with a meal. 07/22/20   Brita Romp, NP  methadone (DOLOPHINE) 10 MG/5ML solution Take 15 mg by mouth every morning.     [provider]  NOVOLOG FLEXPEN 100 UNIT/ML FlexPen SMARTSIG:10 Unit(s) SUB-Q 06/19/20   [provider]  nystatin (MYCOSTATIN) 100000 UNIT/ML suspension Take 5 mLs (500,000 Units total) by mouth 4 (four) times daily. 06/13/20   Wurst, [place], PA-C   ondansetron (ZOFRAN-ODT) 4 MG disintegrating tablet Take 4 mg by mouth every 8 (eight) hours as needed. 06/14/20   [provider]  pantoprazole (PROTONIX) 40 MG tablet Take 40 mg by mouth 2 (two) times daily. 07/05/20  [provider]  potassium chloride (KLOR-CON) 10 MEQ tablet Take 1 tablet by mouth daily. 04/24/20   [provider]  predniSONE (DELTASONE) 10 MG tablet Take 2 tablets (20 mg total) by mouth daily. Patient not taking: Reported on 07/22/2020 07/09/20   Emerson Monte, FNP  pregabalin (LYRICA) 75 MG capsule Take 75 mg by mouth 2 (two) times daily.    [provider]  QUEtiapine (SEROQUEL) 25 MG tablet Take 1 tablet (25 mg total) by mouth at bedtime. 07/19/20   Norman Clay, MD  RESTASIS 0.05 % ophthalmic emulsion Place 1 drop into both eyes as needed.  04/22/19   [provider]  spironolactone (ALDACTONE) 100 MG tablet Take 1 tablet by mouth daily. 05/22/20   [provider]  sucralfate (CARAFATE) 1 g tablet Take 1 g by mouth 4 (four) times daily. 07/02/20   [provider]  tetrahydrozoline 0.05 % ophthalmic solution Place 2 drops into both eyes 2 (two) times daily as needed (allergies/dry/itchy).    [provider]  torsemide (DEMADEX) 20 MG tablet Take 2 tablets by mouth daily. 05/20/20   [provider]  venlafaxine XR (EFFEXOR-XR) 150 MG 24 hr capsule Take total of 225 mg daily (150 mg + 75 mg ) 07/19/20   Norman Clay, MD  zaleplon (SONATA) 10 MG capsule Take 10 mg by mouth at bedtime as needed for sleep.    [provider]     Allergies:     Allergies  Allergen Reactions  . Iodine-131 Anaphylaxis, Shortness Of Breath and Swelling  . Ivp Dye [Iodinated Diagnostic Agents] Anaphylaxis    Skin gets very red, unable to walk   . Ketorolac Tromethamine Shortness Of Breath  . Tylenol [Acetaminophen] Other (See Comments)    Due to cirrhosis   . Gabapentin   . Morphine And Related   .  Nsaids Other (See Comments)    Flares ulcers  . Suboxone [Buprenorphine Hcl-Naloxone Hcl]   . Vancomycin Swelling    Facial swelling,      Physical Exam:   Vitals  Blood pressure (!) 146/87, pulse (!) 135, temperature 98.5 F (36.9 C), temperature source Oral, resp. rate 18, height 5' 9"  (1.753 m), weight (!) 137 kg, SpO2 100 %.  1.  General: Appears in mild respiratory distress  2. Psychiatric: Alert, oriented x3, intact insight and judgment  3. Neurologic: Cranial nerves II through XII grossly intact, no focal deficit noted  4. HEENMT:  Atraumatic normocephalic, extraocular muscles are intact  5. Respiratory : Clear to auscultation bilaterally, no wheezing or crackles auscultated  6. Cardiovascular : S1-S2, regular, no murmur auscultated  7. Gastrointestinal:  Abdomen is soft, distended, nontender, dullness to percussion at the flanks     Data Review:    CBC Recent Labs  Lab 07/29/20 1301  WBC 2.3*  HGB 10.6*  HCT 34.0*  PLT 76*  MCV 92.9  MCH 29.0  MCHC 31.2  RDW 16.0*   ------------------------------------------------------------------------------------------------------------------  Results for orders placed or performed during the hospital encounter of 07/29/20 (from the past 48 hour(s))  POC urine preg, ED     Status: None   Collection Time: 07/29/20  1:00 PM  Result Value Ref Range   Preg Test, Ur NEGATIVE NEGATIVE    Comment:        THE SENSITIVITY OF THIS METHODOLOGY IS >24 mIU/mL   Basic metabolic panel     Status: Abnormal   Collection Time: 07/29/20  1:01 PM  Result Value  Ref Range   Sodium 138 135 - 145 mmol/L   Potassium 4.0 3.5 - 5.1 mmol/L   Chloride 106 98 - 111 mmol/L   CO2 23 22 - 32 mmol/L   Glucose, Bld 129 (H) 70 - 99 mg/dL    Comment: Glucose reference range applies only to samples taken after fasting for at least 8 hours.   BUN 11 6 - 20 mg/dL   Creatinine, Ser 0.44 0.44 - 1.00 mg/dL   Calcium 8.6 (L) 8.9 - 10.3 mg/dL     GFR calc non Af Amer >60 >60 mL/min   GFR calc Af Amer >60 >60 mL/min   Anion gap 9 5 - 15    Comment: Performed at [facility], [address], [place], [place] [address]  CBC     Status: Abnormal   Collection Time: 07/29/20  1:01 PM  Result Value Ref Range   WBC 2.3 (L) 4.0 - 10.5 K/uL   RBC 3.66 (L) 3.87 - 5.11 MIL/uL   Hemoglobin 10.6 (L) 12.0 - 15.0 g/dL   HCT 34.0 (L) 36 - 46 %   MCV 92.9 80.0 - 100.0 fL   MCH 29.0 26.0 - 34.0 pg   MCHC 31.2 30.0 - 36.0 g/dL   RDW 16.0 (H) 11.5 - 15.5 %   Platelets 76 (L) 150 - 400 K/uL    Comment: REPEATED TO VERIFY PLATELET COUNT CONFIRMED BY SMEAR SPECIMEN CHECKED FOR CLOTS    nRBC 0.0 0.0 - 0.2 %    Comment: Performed at [facility], [address], [place], [place] [address]  Hepatic function panel     Status: Abnormal   Collection Time: 07/29/20  1:01 PM  Result Value Ref Range   Total Protein 6.8 6.5 - 8.1 g/dL   Albumin 3.1 (L) 3.5 - 5.0 g/dL   AST 36 15 - 41 U/L   ALT 35 0 - 44 U/L   Alkaline Phosphatase 112 38 - 126 U/L   Total Bilirubin 0.6 0.3 - 1.2 mg/dL   Bilirubin, Direct 0.1 0.0 - 0.2 mg/dL   Indirect Bilirubin 0.5 0.3 - 0.9 mg/dL    Comment: Performed at [facility], [address], [place], [place] [address]  Magnesium     Status: Abnormal   Collection Time: 07/29/20  1:01 PM  Result Value Ref Range   Magnesium 1.5 (L) 1.7 - 2.4 mg/dL    Comment: Performed at [facility], [address] [place], [place] [address]  Brain natriuretic peptide     Status: None   Collection Time: 07/29/20  1:01 PM  Result Value Ref Range   B Natriuretic Peptide 26.0 0.0 - 100.0 pg/mL    Comment: Performed at [facility], [address], [place], [place] [address]  Protime-INR     Status: None   Collection Time: 07/29/20  1:01 PM  Result Value Ref Range   Prothrombin Time 13.0 11.4 - 15.2 seconds   INR 1.0 0.8 - 1.2    Comment: (NOTE) INR goal varies based on device and disease states. Performed at [facility], [address], [place], [place] [address]   Troponin I (High Sensitivity)     Status: None   Collection Time: 07/29/20  1:01 PM  Result Value Ref Range   Troponin I (High Sensitivity) 7 <18 ng/L    Comment: (NOTE) Elevated high sensitivity troponin I (hsTnI) values and significant  changes across serial measurements may suggest ACS but many other  chronic and acute conditions  are known to elevate hsTnI results.  Refer to the "Links" section for chest pain algorithms and additional  guidance. Performed at [facility], [address], [place], [place] [address]   Urinalysis, Routine w reflex microscopic     Status: Abnormal   Collection Time: 07/29/20  2:47 PM  Result Value Ref Range   Color, Urine AMBER (A) YELLOW    Comment: BIOCHEMICALS MAY BE AFFECTED BY COLOR   APPearance HAZY (A) CLEAR   Specific Gravity, Urine 1.038 (H) 1.005 - 1.030   pH 5.0 5.0 - 8.0   Glucose, UA NEGATIVE NEGATIVE mg/dL   Hgb urine dipstick NEGATIVE NEGATIVE   Bilirubin Urine NEGATIVE NEGATIVE   Ketones, ur NEGATIVE NEGATIVE mg/dL   Protein, ur NEGATIVE NEGATIVE mg/dL   Nitrite NEGATIVE NEGATIVE   Leukocytes,Ua NEGATIVE NEGATIVE    Comment: Performed at [facility], [address] [place], [place] [address]  Pregnancy, urine     Status: None   Collection Time: 07/29/20  2:47 PM  Result Value Ref Range   Preg Test, Ur NEGATIVE NEGATIVE    Comment:        THE SENSITIVITY OF THIS METHODOLOGY IS >20 mIU/mL. Performed at [facility], [address] [place], [place] [address]   Rapid urine drug screen (hospital performed)     Status: None   Collection Time: 07/29/20  2:47 PM  Result Value Ref Range   Opiates NONE DETECTED NONE DETECTED   Cocaine NONE DETECTED NONE DETECTED   Benzodiazepines NONE DETECTED NONE DETECTED   Amphetamines NONE DETECTED NONE DETECTED   Tetrahydrocannabinol NONE DETECTED NONE DETECTED   Barbiturates NONE DETECTED NONE DETECTED    Comment: (NOTE) DRUG SCREEN FOR MEDICAL PURPOSES ONLY.   IF CONFIRMATION IS NEEDED FOR ANY PURPOSE, NOTIFY LAB WITHIN 5 DAYS.  LOWEST DETECTABLE LIMITS FOR URINE DRUG SCREEN Drug Class                     Cutoff (ng/mL) Amphetamine and metabolites    1000 Barbiturate and metabolites    200 Benzodiazepine                 944 Tricyclics and metabolites     300 Opiates and metabolites        300 Cocaine and metabolites        300 THC                            50 Performed at [facility], [address], [place], [place] [address]   CBG monitoring, ED     Status: Abnormal   Collection Time: 07/29/20  3:48 PM  Result Value Ref Range   Glucose-Capillary 63 (L) 70 - 99 mg/dL    Comment: Glucose reference range applies only to samples taken after fasting for at least 8 hours.  CBG monitoring, ED     Status: None   Collection Time: 07/29/20  4:09 PM  Result Value Ref Range   Glucose-Capillary 73 70 - 99 mg/dL    Comment: Glucose reference range applies only to samples taken after fasting for at least 8 hours.    Chemistries  Recent Labs  Lab 07/23/20 0842 07/29/20 1301  NA 143 138  K 3.2* 4.0  CL 106 106  CO2 21 23  GLUCOSE 75 129*  BUN 8 11  CREATININE 0.54* 0.44  CALCIUM 9.0 8.6*  MG  --  1.5*  AST 41* 36  ALT 43* 35  ALKPHOS 124* 112  BILITOT 0.5 0.6   ------------------------------------------------------------------------------------------------------------------  ------------------------------------------------------------------------------------------------------------------ GFR: Estimated Creatinine Clearance: 145 mL/min (by C-G formula based on SCr of 0.44 mg/dL). Liver Function Tests: Recent Labs  Lab 07/23/20 0842 07/29/20 1301  AST 41* 36  ALT 43* 35  ALKPHOS 124* 112  BILITOT 0.5 0.6  PROT 6.3 6.8  ALBUMIN 3.3* 3.1*   No results for input(s): LIPASE, AMYLASE in the last 168 hours. No results for input(s): AMMONIA in the last 168 hours. Coagulation Profile: Recent Labs  Lab 07/29/20 1301  INR 1.0    Cardiac Enzymes: No results for input(s): CKTOTAL, CKMB, CKMBINDEX, TROPONINI in the last 168 hours. BNP (last 3 results) No results for input(s): PROBNP in the last 8760 hours. HbA1C: No results for input(s): HGBA1C in the last 72 hours. CBG: Recent Labs  Lab 07/29/20 1548 07/29/20 1609  GLUCAP 63* 73   Lipid Profile: No results for input(s): CHOL, HDL, LDLCALC, TRIG, CHOLHDL, LDLDIRECT in the last 72 hours. Thyroid Function Tests: No results for input(s): TSH, T4TOTAL, FREET4, T3FREE, THYROIDAB in the last 72 hours. Anemia Panel: No results for input(s): VITAMINB12, FOLATE, FERRITIN, TIBC, IRON, RETICCTPCT in the last 72 hours.  --------------------------------------------------------------------------------------------------------------- Urine analysis:    Component Value Date/Time   COLORURINE AMBER (A) 07/29/2020 1447   APPEARANCEUR HAZY (A) 07/29/2020 1447   LABSPEC 1.038 (H) 07/29/2020 1447   PHURINE 5.0 07/29/2020 1447   GLUCOSEU NEGATIVE 07/29/2020 1447   HGBUR NEGATIVE 07/29/2020 1447   BILIRUBINUR NEGATIVE 07/29/2020 1447   BILIRUBINUR negative 04/10/2020 1822   KETONESUR NEGATIVE 07/29/2020 1447   PROTEINUR NEGATIVE 07/29/2020 1447   UROBILINOGEN 1.0 04/10/2020 1822   UROBILINOGEN 1.0 10/07/2013 0644   NITRITE NEGATIVE 07/29/2020 1447   LEUKOCYTESUR NEGATIVE 07/29/2020 1447      Imaging Results:    DG Chest 2 View  Result Date: 07/29/2020 CLINICAL DATA:  Shortness of breath, chest pain EXAM: CHEST - 2 VIEW COMPARISON:  05/26/2019 FINDINGS: The heart size and mediastinal contours are within normal limits. Slightly low lung volumes with mild bibasilar atelectasis. No focal airspace consolidation, pleural effusion, or pneumothorax. The visualized skeletal structures are unremarkable. IMPRESSION: Slightly low lung volumes with mild bibasilar atelectasis. Otherwise, no acute cardiopulmonary findings. Electronically Signed   By: Davina Poke D.O.   On:  07/29/2020 11:55   CT Chest Wo Contrast  Result Date: 07/29/2020 CLINICAL DATA:  Shortness of breath and chest pain. EXAM: CT CHEST WITHOUT CONTRAST TECHNIQUE: Multidetector CT imaging of the chest was performed following the standard protocol without IV contrast. COMPARISON:  July 17, 2016 FINDINGS: Cardiovascular: No significant vascular findings. Normal heart size. No pericardial effusion. Mediastinum/Nodes: No enlarged mediastinal or axillary lymph nodes. Thyroid gland, trachea, and esophagus demonstrate no significant findings. Lungs/Pleura: A mild amount of atelectasis is seen along the inferior aspect of the left upper lobe. There is no evidence of a pleural effusion or pneumothorax. Upper Abdomen: Multiple surgical clips are seen within the gallbladder fossa. The liver is moderately enlarged. The spleen is markedly enlarged. Musculoskeletal: No chest wall mass or suspicious bone lesions identified. IMPRESSION: 1. Mild inferior left upper lobe atelectasis. 2. Hepatosplenomegaly. 3. Evidence of prior cholecystectomy. Electronically Signed   By: Virgina Norfolk M.D.   On: 07/29/2020 15:46    My personal review of EKG: Rhythm NSR, no ST changes   Assessment & Plan:    Active Problems:   Dyspnea   1. Dyspnea-likely from severe abdominal distention from underlying ascites.  Chest x-ray is unremarkable.  CT chest unremarkable.  Patient is requiring 2 L/min of oxygen.  IV Lasix has been given for anasarca. 2. Anasarca-patient has gained more than 30 pounds over the past few weeks.  She has history of liver cirrhosis from hepatitis C.  Lasix 80 mg IV given in the ED with good diuretic response.  We will start Lasix 60 mg IV every 12 hours.  Aldactone 50 mg daily. 3. Ascites-we will obtain abdominal ultrasound guided paracentesis per IR. 4. Liver cirrhosis-secondary to  hepatitis C, status post treatment.  Followed by GI at [facility].  LFTs are within normal range. 5. Diabetes mellitus type  2-patient had low blood glucose in the ED.  Improved after eating peanut butter and crackers.  We will continue to monitor patient blood glucose and start sliding scale insulin with NovoLog. 6. Pancytopenia-likely from hypersplenism, CT abdomen and pelvis from February 2021 showed marked splenomegaly. 7. Hypomagnesemia-magnesium replaced in the ED.  Follow magnesium level in a.m.   DVT Prophylaxis-   Lovenox  AM Labs Ordered, also please review Full Orders  Family Communication: Admission, patients condition and plan of care including tests being ordered have been discussed with the patient  who indicate understanding and agree with the plan and Code Status.  Code Status:  Full code  Admission status: Inpatient :The appropriate admission status for this patient is INPATIENT. Inpatient status is judged to be reasonable and necessary in order to provide the required intensity of service to ensure the patient's safety. The patient's presenting symptoms, physical exam findings, and initial radiographic and laboratory data in the context of their chronic comorbidities is felt to place them at high risk for further clinical deterioration. Furthermore, it is not anticipated that the patient will be medically stable for discharge from the hospital within 2 midnights of admission. The following factors support the admission status of inpatient.     The patient's presenting symptoms include shortness of breath The worrisome physical exam findings include abdominal distention. The initial radiographic and laboratory data are worrisome because of liver cirrhosis/ascites. The chronic co-morbidities include morbid obesity.       * I certify that at the point of admission it is my clinical judgment that the patient will require inpatient hospital care spanning beyond 2 midnights from the point of admission due to high intensity of service, high risk for further deterioration and high frequency of  surveillance required.*  Time spent in minutes : 60 minutes   Avonda Toso S Mylin Gignac M.D

## 2020-07-29 NOTE — ED Notes (Signed)
Pt transported to CT ?

## 2020-07-30 ENCOUNTER — Inpatient Hospital Stay (HOSPITAL_COMMUNITY): Payer: Medicaid Other

## 2020-07-30 ENCOUNTER — Encounter (HOSPITAL_COMMUNITY): Payer: Self-pay | Admitting: Family Medicine

## 2020-07-30 DIAGNOSIS — R0602 Shortness of breath: Secondary | ICD-10-CM

## 2020-07-30 LAB — COMPREHENSIVE METABOLIC PANEL
ALT: 30 U/L (ref 0–44)
AST: 32 U/L (ref 15–41)
Albumin: 2.8 g/dL — ABNORMAL LOW (ref 3.5–5.0)
Alkaline Phosphatase: 92 U/L (ref 38–126)
Anion gap: 7 (ref 5–15)
BUN: 12 mg/dL (ref 6–20)
CO2: 26 mmol/L (ref 22–32)
Calcium: 8.3 mg/dL — ABNORMAL LOW (ref 8.9–10.3)
Chloride: 106 mmol/L (ref 98–111)
Creatinine, Ser: 0.47 mg/dL (ref 0.44–1.00)
GFR calc Af Amer: >60 mL/min (ref >60)
GFR calc non Af Amer: >60 mL/min (ref >60)
Glucose, Bld: 105 mg/dL — ABNORMAL HIGH (ref 70–99)
Potassium: 3.7 mmol/L (ref 3.5–5.1)
Sodium: 139 mmol/L (ref 135–145)
Total Bilirubin: 0.7 mg/dL (ref 0.3–1.2)
Total Protein: 6.2 g/dL — ABNORMAL LOW (ref 6.5–8.1)

## 2020-07-30 LAB — CBC
HCT: 33.8 % — ABNORMAL LOW (ref 36.0–46.0)
Hemoglobin: 10.2 g/dL — ABNORMAL LOW (ref 12.0–15.0)
MCH: 28.4 pg (ref 26.0–34.0)
MCHC: 30.2 g/dL (ref 30.0–36.0)
MCV: 94.2 fL (ref 80.0–100.0)
Platelets: 75 10*3/uL — ABNORMAL LOW (ref 150–400)
RBC: 3.59 MIL/uL — ABNORMAL LOW (ref 3.87–5.11)
RDW: 16 % — ABNORMAL HIGH (ref 11.5–15.5)
WBC: 2.4 10*3/uL — ABNORMAL LOW (ref 4.0–10.5)
nRBC: 0 % (ref 0.0–0.2)

## 2020-07-30 LAB — D-DIMER, QUANTITATIVE: D-Dimer, Quant: 0.63 ug/mL-FEU — ABNORMAL HIGH (ref 0.00–0.50)

## 2020-07-30 LAB — CBG MONITORING, ED
Glucose-Capillary: 57 mg/dL — ABNORMAL LOW (ref 70–99)
Glucose-Capillary: 63 mg/dL — ABNORMAL LOW (ref 70–99)
Glucose-Capillary: 68 mg/dL — ABNORMAL LOW (ref 70–99)
Glucose-Capillary: 116 mg/dL — ABNORMAL HIGH (ref 70–99)
Glucose-Capillary: 99 mg/dL (ref 70–99)
Glucose-Capillary: 99 mg/dL (ref 70–99)

## 2020-07-30 LAB — GLUCOSE, CAPILLARY
Glucose-Capillary: 117 mg/dL — ABNORMAL HIGH (ref 70–99)
Glucose-Capillary: 172 mg/dL — ABNORMAL HIGH (ref 70–99)

## 2020-07-30 LAB — MAGNESIUM: Magnesium: 1.9 mg/dL (ref 1.7–2.4)

## 2020-07-30 LAB — TSH: TSH: 0.993 u[IU]/mL (ref 0.350–4.500)

## 2020-07-30 MED ORDER — DIPHENHYDRAMINE HCL 25 MG PO CAPS: 50.0000 mg | ORAL_CAPSULE | Freq: Once | ORAL | Status: DC

## 2020-07-30 MED ORDER — CLONAZEPAM 0.5 MG PO TABS
1.0000 mg | ORAL_TABLET | Freq: Two times a day (BID) | ORAL | Status: DC | PRN
  Administered 2020-07-30 – 2020-07-31 (×2): 1 mg via ORAL
  Filled 2020-07-30 (×2): qty 2

## 2020-07-30 MED ORDER — DIPHENHYDRAMINE HCL 50 MG/ML IJ SOLN: 50.0000 mg | Freq: Once | INTRAMUSCULAR | Status: DC

## 2020-07-30 MED ORDER — TECHNETIUM TO 99M ALBUMIN AGGREGATED
4.0000 | Freq: Once | INTRAVENOUS | Status: AC | PRN
  Administered 2020-07-30: 4.1 via INTRAVENOUS

## 2020-07-30 MED ORDER — HYDROCORTISONE NA SUCCINATE PF 250 MG IJ SOLR
200.0000 mg | Freq: Once | INTRAMUSCULAR | Status: DC
  Filled 2020-07-30: qty 200

## 2020-07-30 NOTE — ED Notes (Signed)
Patient drinking orange juice and crackers at this time. States that she is asymptomatic from lower blood sugar.

## 2020-07-30 NOTE — Progress Notes (Addendum)
PROGRESS NOTE    Jaime [name]  FXT:[id number] DOB: 12/30/1985 DOA: 07/29/2020 PCP: Neale Burly, MD   Brief Narrative:  Patient is a 34 year old female with history of liver cirrhosis secondary to hepatitis C status post treatment, IV drug abuse, insulin-dependent diabetes type 2, COPD who presented to the emergency room with complaint of shortness of breath, chest pain, lower extremity edema, abdominal distention.  She reported weight gain at home of 30 pounds recently.  Patient was sent to the emergency department by her PCP to rule out PE.  She presented with dyspnea.  Instead of doing CT chest with contrast, CT chest without contrast was done in the emergency department and it did not show any acute findings.  On presentation, she was found to have ascites which was thought to have  contributed to shortness of breath.  But US abdomen did not show significant intraabdominal fluid. Patient remains in sinus tachycardia, but not significantly hypoxic.  She is allergic to dye so ordering CT angiogram PE protocol with premedication.  Assessment & Plan:   Active Problems:   Dyspnea   Dyspnea: thought to be secondary to  Ascites but there is no significant amount of fluid in the abdomen for paracentesis as per ultrasound of the abdomen..  Chest x-ray did not show any acute findings.  CT chest without contrast was unremarkable.  On 2 L of oxygen per minute.We try to wean the oxygen.  She is also on significant sinus tachycardia and complains of shortness of breath.TSH is normal.   We will do CT angiogram PE protocol.  She is allergic to dye so will order premedication.  Monitor on telemetry.  Anasarca/ascites: Likely secondary to chronic liver disease.  Reported 30 pound weight gain in last few weeks.  She takes oral Lasix, spironolactone at home.  Given Lasix 80 mg IV once in the emergency department. She has significant bilateral lower extremity edema.  Continue currently iV Lasix regimen.   Continue Aldactone.  Continue to monitor daily weight.  We will also do an echocardiogram.  Liver cirrhosis: Secondary to hepatitis C.  She has been treated for hepatitis C.  She needs to follow-up with hepatology as an outpatient.  She follows with [facility].  Diabetes type 2: On insulin at home.  Continue current insulin regimen.  Monitor blood sugars  Pancytopenia: Secondary to hypersplenism/liver cirrhosis.  Continue to monitor  Hypomagnesemia: Supplemented with magnesium in the emergency department.  Anxiety disorder: Clonazepam ,venlafaxine at home.  Hypertension: Currently blood pressure stable.  Takes lisinopril.  Addendum: Radiology department did not recommend to do CT angio with contrast even with premedication because of anaphylaxis history. Ordering VQ scan( admitting physician states patient had a VQ scan few  days ago but could not find on the chart), D-dimer and bilateral lower extremity venous duplex.           DVT prophylaxis:Lovenox Code Status: Full Family Communication: None at the bedside Status is: Inpatient  Remains inpatient appropriate because:Inpatient level of care appropriate due to severity of illness   Dispo: The patient is from: Home              Anticipated d/c is to: Home              Anticipated d/c date is: 1-2 days              Patient currently is not medically stable to d/c.     Consultants: None  Procedures:None  Antimicrobials:  Anti-infectives (  From admission, onward)   Start     Dose/Rate Route Frequency Ordered Stop   07/29/20 1645  fluconazole (DIFLUCAN) tablet 400 mg        400 mg Oral Daily 07/29/20 1630        Subjective:  Patient seen and examined the bedside this morning.  Anxious during my evaluation.  Complaining of some shortness of breath but was not in any significant distress.  On 2 L of oxygen during my evaluation.  Has significant bilateral lower extremity edema.  Objective: Vitals:   07/30/20 0730  07/30/20 0745 07/30/20 0815 07/30/20 0830  BP:   130/89 (!) 140/105  Pulse: (!) 119 (!) 115 (!) 113 (!) 118  Resp: 12 13 20  (!) 22  Temp:      TempSrc:      SpO2: 99% 99% 100% 99%  Weight:      Height:        Intake/Output Summary (Last 24 hours) at 07/30/2020 0854 Last data filed at 07/30/2020 0100 Gross per 24 hour  Intake 73.33 ml  Output 1800 ml  Net -1726.67 ml   Filed Weights   07/29/20 1127  Weight: (!) 137 kg    Examination:  General exam: Morbidly obese, anxious HEENT:PERRL,Oral mucosa moist, Ear/Nose normal on gross exam Respiratory system: Bilateral equal air entry, normal vesicular breath sounds, no wheezes or crackles  Cardiovascular system: Sinus tachycardia. No JVD, murmurs, rubs, gallops or clicks.  2-3+ bilateral lower extremity pedal edema. Gastrointestinal system: Abdomen is nondistended, soft and nontender. No organomegaly or masses felt. Normal bowel sounds heard. Central nervous system: Alert and oriented. No focal neurological deficits. Extremities: 2-3+ bilateral lower extremity pitting edema, no clubbing ,no cyanosis Skin: No rashes, lesions or ulcers,no icterus ,no pallor   Data Reviewed: I have personally reviewed following labs and imaging studies  CBC: Recent Labs  Lab 07/29/20 1301 07/30/20 0408  WBC 2.3* 2.4*  HGB 10.6* 10.2*  HCT 34.0* 33.8*  MCV 92.9 94.2  PLT 76* 75*   Basic Metabolic Panel: Recent Labs  Lab 07/29/20 1301 07/30/20 0408  NA 138 139  K 4.0 3.7  CL 106 106  CO2 23 26  GLUCOSE 129* 105*  BUN 11 12  CREATININE 0.44 0.47  CALCIUM 8.6* 8.3*  MG 1.5* 1.9   GFR: Estimated Creatinine Clearance: 145 mL/min (by C-G formula based on SCr of 0.47 mg/dL). Liver Function Tests: Recent Labs  Lab 07/29/20 1301 07/30/20 0408  AST 36 32  ALT 35 30  ALKPHOS 112 92  BILITOT 0.6 0.7  PROT 6.8 6.2*  ALBUMIN 3.1* 2.8*   No results for input(s): LIPASE, AMYLASE in the last 168 hours. No results for input(s): AMMONIA  in the last 168 hours. Coagulation Profile: Recent Labs  Lab 07/29/20 1301  INR 1.0   Cardiac Enzymes: No results for input(s): CKTOTAL, CKMB, CKMBINDEX, TROPONINI in the last 168 hours. BNP (last 3 results) No results for input(s): PROBNP in the last 8760 hours. HbA1C: Recent Labs    07/29/20 1301  HGBA1C 8.0*   CBG: Recent Labs  Lab 07/29/20 1609 07/29/20 1729 07/30/20 0101 07/30/20 0749 07/30/20 0825  GLUCAP 73 95 57* 68* 63*   Lipid Profile: No results for input(s): CHOL, HDL, LDLCALC, TRIG, CHOLHDL, LDLDIRECT in the last 72 hours. Thyroid Function Tests: No results for input(s): TSH, T4TOTAL, FREET4, T3FREE, THYROIDAB in the last 72 hours. Anemia Panel: No results for input(s): VITAMINB12, FOLATE, FERRITIN, TIBC, IRON, RETICCTPCT in the last 72 hours.  Sepsis Labs: No results for input(s): PROCALCITON, LATICACIDVEN in the last 168 hours.  Recent Results (from the past 240 hour(s))  Respiratory Panel by RT PCR (Flu A&B, Covid) - Nasopharyngeal Swab     Status: None   Collection Time: 07/29/20  4:40 PM   Specimen: Nasopharyngeal Swab  Result Value Ref Range Status   SARS Coronavirus 2 by RT PCR NEGATIVE NEGATIVE Final    Comment: (NOTE) SARS-CoV-2 target nucleic acids are NOT DETECTED.  The SARS-CoV-2 RNA is generally detectable in upper respiratoy specimens during the acute phase of infection. The lowest concentration of SARS-CoV-2 viral copies this assay can detect is 131 copies/mL. A negative result does not preclude SARS-Cov-2 infection and should not be used as the sole basis for treatment or other patient management decisions. A negative result may occur with  improper specimen collection/handling, submission of specimen other than nasopharyngeal swab, presence of viral mutation(s) within the areas targeted by this assay, and inadequate number of viral copies (<131 copies/mL). A negative result must be combined with clinical observations, patient  history, and epidemiological information. The expected result is Negative.  Fact Sheet for Patients:  PinkCheek.be  Fact Sheet for Healthcare Providers:  GravelBags.it  This test is no t yet approved or cleared by the [place] FDA and  has been authorized for detection and/or diagnosis of SARS-CoV-2 by FDA under an Emergency Use Authorization (EUA). This EUA will remain  in effect (meaning this test can be used) for the duration of the COVID-19 declaration under Section 564(b)(1) of the Act, 21 U.S.C. section 360bbb-3(b)(1), unless the authorization is terminated or revoked sooner.     Influenza A by PCR NEGATIVE NEGATIVE Final   Influenza B by PCR NEGATIVE NEGATIVE Final    Comment: (NOTE) The Xpert Xpress SARS-CoV-2/FLU/RSV assay is intended as an aid in  the diagnosis of influenza from Nasopharyngeal swab specimens and  should not be used as a sole basis for treatment. Nasal washings and  aspirates are unacceptable for Xpert Xpress SARS-CoV-2/FLU/RSV  testing.  Fact Sheet for Patients: PinkCheek.be  Fact Sheet for Healthcare Providers: GravelBags.it  This test is not yet approved or cleared by the [place] FDA and  has been authorized for detection and/or diagnosis of SARS-CoV-2 by  FDA under an Emergency Use Authorization (EUA). This EUA will remain  in effect (meaning this test can be used) for the duration of the  Covid-19 declaration under Section 564(b)(1) of the Act, 21  U.S.C. section 360bbb-3(b)(1), unless the authorization is  terminated or revoked. Performed at [facility], [address] [place], [place] [address]          Radiology Studies: DG Chest 2 View  Result Date: 07/29/2020 CLINICAL DATA:  Shortness of breath, chest pain EXAM: CHEST - 2 VIEW COMPARISON:  05/26/2019 FINDINGS: The heart size and mediastinal contours are  within normal limits. Slightly low lung volumes with mild bibasilar atelectasis. No focal airspace consolidation, pleural effusion, or pneumothorax. The visualized skeletal structures are unremarkable. IMPRESSION: Slightly low lung volumes with mild bibasilar atelectasis. Otherwise, no acute cardiopulmonary findings. Electronically Signed   By: Davina Poke D.O.   On: 07/29/2020 11:55   CT Chest Wo Contrast  Result Date: 07/29/2020 CLINICAL DATA:  Shortness of breath and chest pain. EXAM: CT CHEST WITHOUT CONTRAST TECHNIQUE: Multidetector CT imaging of the chest was performed following the standard protocol without IV contrast. COMPARISON:  July 17, 2016 FINDINGS: Cardiovascular: No significant vascular findings. Normal heart size. No pericardial  effusion. Mediastinum/Nodes: No enlarged mediastinal or axillary lymph nodes. Thyroid gland, trachea, and esophagus demonstrate no significant findings. Lungs/Pleura: A mild amount of atelectasis is seen along the inferior aspect of the left upper lobe. There is no evidence of a pleural effusion or pneumothorax. Upper Abdomen: Multiple surgical clips are seen within the gallbladder fossa. The liver is moderately enlarged. The spleen is markedly enlarged. Musculoskeletal: No chest wall mass or suspicious bone lesions identified. IMPRESSION: 1. Mild inferior left upper lobe atelectasis. 2. Hepatosplenomegaly. 3. Evidence of prior cholecystectomy. Electronically Signed   By: Virgina Norfolk M.D.   On: 07/29/2020 15:46   [place] ASCITES (ABDOMEN LIMITED)  Result Date: 07/29/2020 CLINICAL DATA:  Alcoholic cirrhosis, abdominal swelling EXAM: LIMITED ABDOMEN ULTRASOUND FOR ASCITES TECHNIQUE: Limited ultrasound survey for ascites was performed in all four abdominal quadrants. COMPARISON:  CT abdomen and pelvis 12/04/2019 FINDINGS: Minimal ascites identified. Insufficient ascites for paracentesis. IMPRESSION: Insufficient ascites for paracentesis. Electronically Signed    By: Lavonia Dana M.D.   On: 07/29/2020 16:57        Scheduled Meds: . enoxaparin (LOVENOX) injection  70 mg Subcutaneous Q24H  . fluconazole  400 mg Oral Daily  . furosemide  60 mg Intravenous Q12H  . insulin aspart  0-9 Units Subcutaneous TID WC  . methadone  15 mg Oral q morning - 10a  . pantoprazole  40 mg Oral BID  . pregabalin  75 mg Oral BID  . QUEtiapine  25 mg Oral QHS  . sodium chloride flush  3 mL Intravenous Q12H  . spironolactone  50 mg Oral Daily  . sucralfate  1 g Oral QID  . venlafaxine XR  225 mg Oral Q breakfast   Continuous Infusions: . sodium chloride       LOS: 1 day    Time spent: 35 mins,More than 50% of that time was spent in counseling and/or coordination of care.      Shelly Coss, MD [facility] P9/28/2021, 8:54 AM

## 2020-07-30 NOTE — ED Notes (Signed)
Patient given sandwich, chips, and drink.

## 2020-07-30 NOTE — ED Notes (Signed)
Receiving nurse unable to take report at this time. Will call back when available.

## 2020-07-31 ENCOUNTER — Inpatient Hospital Stay (HOSPITAL_COMMUNITY): Payer: Medicaid Other

## 2020-07-31 DIAGNOSIS — I5031 Acute diastolic (congestive) heart failure: Secondary | ICD-10-CM

## 2020-07-31 LAB — BASIC METABOLIC PANEL
Anion gap: 14 (ref 5–15)
BUN: 10 mg/dL (ref 6–20)
CO2: 30 mmol/L (ref 22–32)
Calcium: 9.2 mg/dL (ref 8.9–10.3)
Chloride: 92 mmol/L — ABNORMAL LOW (ref 98–111)
Creatinine, Ser: 0.58 mg/dL (ref 0.44–1.00)
GFR calc Af Amer: >60 mL/min (ref >60)
GFR calc non Af Amer: >60 mL/min (ref >60)
Glucose, Bld: 92 mg/dL (ref 70–99)
Potassium: 3.1 mmol/L — ABNORMAL LOW (ref 3.5–5.1)
Sodium: 136 mmol/L (ref 135–145)

## 2020-07-31 LAB — CBC WITH DIFFERENTIAL/PLATELET
Basophils Absolute: 0 10*3/uL (ref 0.0–0.1)
Basophils Relative: 1 %
Eosinophils Absolute: 0.2 10*3/uL (ref 0.0–0.5)
Eosinophils Relative: 2 %
HCT: 41.6 % (ref 36.0–46.0)
Hemoglobin: 13.2 g/dL (ref 12.0–15.0)
Lymphocytes Relative: 13 %
Lymphs Abs: 0.9 10*3/uL (ref 0.7–4.0)
MCH: 28.1 pg (ref 26.0–34.0)
MCHC: 31.7 g/dL (ref 30.0–36.0)
MCV: 88.7 fL (ref 80.0–100.0)
Monocytes Absolute: 0.7 10*3/uL (ref 0.1–1.0)
Monocytes Relative: 9 %
Neutro Abs: 5.2 10*3/uL (ref 1.7–7.7)
Neutrophils Relative %: 74 %
Platelets: 140 10*3/uL — ABNORMAL LOW (ref 150–400)
RBC: 4.69 MIL/uL (ref 3.87–5.11)
RDW: 16.2 % — ABNORMAL HIGH (ref 11.5–15.5)
WBC: 7 10*3/uL (ref 4.0–10.5)

## 2020-07-31 LAB — GLUCOSE, CAPILLARY
Glucose-Capillary: 111 mg/dL — ABNORMAL HIGH (ref 70–99)
Glucose-Capillary: 226 mg/dL — ABNORMAL HIGH (ref 70–99)
Glucose-Capillary: 196 mg/dL — ABNORMAL HIGH (ref 70–99)
Glucose-Capillary: 204 mg/dL — ABNORMAL HIGH (ref 70–99)

## 2020-07-31 LAB — ECHOCARDIOGRAM COMPLETE
Area-P 1/2: 4.29 cm2
Height: 69 in
S' Lateral: 3.97 cm
Weight: 4832.48 oz

## 2020-07-31 MED ORDER — POTASSIUM CHLORIDE 10 MEQ/100ML IV SOLN
10.0000 meq | INTRAVENOUS | Status: AC
  Administered 2020-07-31 (×4): 10 meq via INTRAVENOUS
  Filled 2020-07-31 (×4): qty 100

## 2020-07-31 MED ORDER — LABETALOL HCL 5 MG/ML IV SOLN
10.0000 mg | Freq: Once | INTRAVENOUS | Status: AC
  Administered 2020-07-31: 10 mg via INTRAVENOUS
  Filled 2020-07-31: qty 4

## 2020-07-31 NOTE — Progress Notes (Signed)
Midlevel notified of increased bp and heart rate. New order for Labetalol 10 mg, medication administered.

## 2020-07-31 NOTE — Plan of Care (Signed)
  Problem: Acute Rehab PT Goals(only PT should resolve) Goal: Pt will Roll Supine to Side Flowsheets (Taken 07/31/2020 1030) Pt will Roll Supine to Side:  Independently  with modified independence Goal: Pt Will Go Supine/Side To Sit Flowsheets (Taken 07/31/2020 1030) Pt will go Supine/Side to Sit:  Independently  with modified independence Goal: Patient Will Transfer Sit To/From Stand Flowsheets (Taken 07/31/2020 1030) Patient will transfer sit to/from stand:  Independently  with modified independence Goal: Pt Will Transfer Bed To Chair/Chair To Bed Flowsheets (Taken 07/31/2020 1030) Pt will Transfer Bed to Chair/Chair to Bed:  Independently  with modified independence Goal: Pt Will Ambulate Flowsheets (Taken 07/31/2020 1030) Pt will Ambulate:  50 feet  with modified independence  with supervision   10:30 AM, 07/31/20 Lonell Grandchild, MPT Physical Therapist with [facility] [phone] office [phone] mobile phone

## 2020-07-31 NOTE — TOC Initial Note (Signed)
Transition of Care [facility]) - Initial/Assessment Note    Patient Details  Name: Jaime [name] MRN: [id number] Date of Birth: [birth year]  Transition [facility]) CM/SW Contact:    Salome Arnt, [place] Phone Number: 07/31/2020, 9:08 AM  Clinical Narrative:    Pt admitted due to dyspnea and anasarca.  Pt has history of liver cirrhosis from hepatitis C. She had a 30 pound weight gain over the past few weeks. Pt reports she lives with her mom, step-father, and brother. PT evaluated pt this morning and recommend home health. Pt admits to feeling a little weak. LCSW discussed with pt and offered outpatient PT as pt can receive more therapy this way. Pt agreeable. Referral made to [facility] TOC will continue to follow.                Expected Discharge Plan: OP Rehab Barriers to Discharge: Continued Medical Work up   Patient Goals and CMS Choice Patient states their goals for this hospitalization and ongoing recovery are:: return home      Expected Discharge Plan and Services Expected Discharge Plan: OP Rehab In-house Referral: Clinical Social Work     Living arrangements for the past 2 months: Single Family Home                                      Prior Living Arrangements/Services Living arrangements for the past 2 months: Single Family Home Lives with:: Parents, Siblings Patient language and need for interpreter reviewed:: Yes Do you feel safe going back to the place where you live?: Yes            Criminal Activity/Legal Involvement Pertinent to Current Situation/Hospitalization: No - Comment as needed  Activities of Daily Living Home Assistive Devices/Equipment: CBG Meter ADL Screening (condition at time of admission) Patient's cognitive ability adequate to safely complete daily activities?: Yes Is the patient deaf or have difficulty hearing?: No Does the patient have difficulty seeing, even when wearing glasses/contacts?: No Does the  patient have difficulty concentrating, remembering, or making decisions?: No Patient able to express need for assistance with ADLs?: Yes Does the patient have difficulty dressing or bathing?: No Independently performs ADLs?: Yes (appropriate for developmental age) Does the patient have difficulty walking or climbing stairs?: Yes Weakness of Legs: None Weakness of Arms/Hands: None  Permission Sought/Granted                  Emotional Assessment   Attitude/Demeanor/Rapport: Engaged Affect (typically observed): Accepting Orientation: : Oriented to Place, Oriented to Self, Oriented to  Time, Oriented to Situation   Psych Involvement: Outpatient Provider  Admission diagnosis:  Hypokalemia [E87.6] Swelling [R60.9] Peripheral edema [R60.9] Dyspnea [R06.00] Ascites [R18.8] Pancytopenia (HCC) [D61.818] Cirrhosis of liver with ascites, unspecified hepatic cirrhosis type ([place]) [K74.60, R18.8] Ascites due to alcoholic hepatitis [A63.01] Patient Active Problem List   Diagnosis Date Noted  . Dyspnea 07/29/2020  . Nexplanon insertion 05/20/2020  . Nexplanon removal 05/20/2020  . Pressure injury of skin 01/29/2019  . Uncontrolled diabetes mellitus ([place]) 09/19/2018  . Hyperglycemia 09/17/2018  . Acute respiratory failure with hypoxia ([place]) 09/15/2018  . Anxiety 09/15/2018  . Chronic abdominal pain 09/15/2018  . Diabetes mellitus without complication ([place]) 60/08/9322  . Hepatitis C 09/15/2018  . Peptic ulcer 09/15/2018  . Long-term current use of methadone for opiate dependence ([place]) 09/15/2018  . Cirrhosis ([place]) 09/15/2018  .  Acute hypoxemic respiratory failure ([place]) 07/17/2016  . Pleural effusion on right 07/17/2016  . Multiple rib fractures involving four or more ribs 07/17/2016  . Hemothorax on right 07/17/2016  . Chronic pain disorder 07/17/2016   PCP:  Neale Burly, MD Pharmacy:   [facility] [address] [place] [address] [place] [id number] [place] [address]  [place] [address] Phone: [phone] Fax: [phone]     Social Determinants of Health (SDOH) Interventions    Readmission Risk Interventions Readmission Risk Prevention Plan 07/31/2020  Transportation Screening Complete  Medication Review ([place]) Complete  HRI or [place] Complete  SW Recovery Care/Counseling Consult Complete  Palliative Care Screening Not [place] Not Applicable  Some recent data might be hidden

## 2020-07-31 NOTE — Progress Notes (Signed)
  Echocardiogram 2D Echocardiogram has been performed.  Jaime [name] 07/31/2020, 11:45 AM

## 2020-07-31 NOTE — Evaluation (Signed)
Physical Therapy Evaluation Patient Details Name: Jaime [name] MRN: [id number] DOB: 02/05/1986 Today's Date: 07/31/2020   History of Present Illness  Jaime [name]  is a 34 y.o. 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  Patient was seen at outside hospital and was given Lasix for diuresis.  At that time she also had VQ scan which was negative for pulmonary embolism.  Patient says that she has continued to gain weight since she was discharged from the hospital.  Usually she weighs around 280 pounds, and has gained more than 30 pounds.  She was told by her PCP to go to ED for CT chest without contrast evaluation to rule out PE.  CT chest showed no significant abnormality.    Clinical Impression  Patient functioning near baseline for functional mobility and gait, demonstrates slow labored movement for rolling to side and sitting up from side lying position, unsteady on feet without loss of balance and limited due to fatigue for ambulation in room/hallway, on room air with SpO2 at 97% and tolerated sitting up in chair after therapy.  Patient will benefit from continued physical therapy in hospital and recommended venue below to increase strength, balance, endurance for safe ADLs and gait.     Follow Up Recommendations Home health PT;Supervision - Intermittent    Equipment Recommendations  None recommended by PT    Recommendations for Other Services       Precautions / Restrictions Precautions Precautions: None Restrictions Weight Bearing Restrictions: No      Mobility  Bed Mobility Overal bed mobility: Modified Independent             General bed mobility comments: slightly labored movement with good return for log rolling to side and sitting up from sidelying  position  Transfers Overall transfer level: Modified independent               General transfer comment: increased time  Ambulation/Gait Ambulation/Gait assistance: Supervision Gait Distance (Feet): 35 Feet Assistive device: None Gait Pattern/deviations: Decreased step length - right;Decreased step length - left;Decreased stride length;Wide base of support Gait velocity: decreased   General Gait Details: slightly unsteady labored cadence without loss of balance, limited secondary to c/o fatigue  Stairs            Wheelchair Mobility    Modified Rankin (Stroke Patients Only)       Balance Overall balance assessment: Mild deficits observed, not formally tested                                           Pertinent Vitals/Pain Pain Assessment: 0-10 Pain Score: 8  Pain Location: low back and chest Pain Descriptors / Indicators: Aching;Sore Pain Intervention(s): Limited activity within patient's tolerance;Monitored during session;Patient requesting pain meds-RN notified    Home Living Family/patient expects to be discharged to:: Private residence Living Arrangements: Parent Available Help at Discharge: Family;Available 24 hours/day Type of Home: House Home Access: Stairs to enter Entrance Stairs-Rails: Right;Left;Can reach both Entrance Stairs-Number of Steps: 2 Home Layout: One level Home Equipment: Walker - 2 wheels;Cane - single point      Prior Function Level of Independence: Independent         Comments: household and  short distanced community ambulator without AD, drives     Hand Dominance        Extremity/Trunk Assessment   Upper Extremity Assessment Upper Extremity Assessment: Overall WFL for tasks assessed    Lower Extremity Assessment Lower Extremity Assessment: Generalized weakness    Cervical / Trunk Assessment Cervical / Trunk Assessment: Normal  Communication   Communication: No difficulties  Cognition  Arousal/Alertness: Awake/alert Behavior During Therapy: WFL for tasks assessed/performed Overall Cognitive Status: Within Functional Limits for tasks assessed                                        General Comments      Exercises     Assessment/Plan    PT Assessment Patient needs continued PT services  PT Problem List Decreased strength;Decreased activity tolerance;Decreased balance;Decreased mobility       PT Treatment Interventions Balance training;Gait training;Stair training;Functional mobility training;Therapeutic activities;Therapeutic exercise;Patient/family education    PT Goals (Current goals can be found in the Care Plan section)  Acute Rehab PT Goals Patient Stated Goal: return home with family to assist PT Goal Formulation: With patient Time For Goal Achievement: 08/02/20 Potential to Achieve Goals: Good    Frequency Min 3X/week   Barriers to discharge        Co-evaluation               AM-PAC PT "6 Clicks" Mobility  Outcome Measure Help needed turning from your back to your side while in a flat bed without using bedrails?: None Help needed moving from lying on your back to sitting on the side of a flat bed without using bedrails?: A Little Help needed moving to and from a bed to a chair (including a wheelchair)?: A Little Help needed standing up from a chair using your arms (e.g., wheelchair or bedside chair)?: None Help needed to walk in hospital room?: A Little Help needed climbing 3-5 steps with a railing? : A Little 6 Click Score: 20    End of Session   Activity Tolerance: Patient tolerated treatment well;Patient limited by fatigue Patient left: in chair;with call bell/phone within reach Nurse Communication: Mobility status PT Visit Diagnosis: Unsteadiness on feet (R26.81);Other abnormalities of gait and mobility (R26.89)    Time: 9390-3009 PT Time Calculation (min) (ACUTE ONLY): 20 min   Charges:   PT Evaluation $PT  Eval Moderate Complexity: 1 Mod PT Treatments $Therapeutic Activity: 8-22 mins        10:28 AM, 07/31/20 Lonell Grandchild, MPT Physical Therapist with [facility] 336 [phone] office [phone] mobile phone

## 2020-07-31 NOTE — Progress Notes (Addendum)
[facility]  PROGRESS NOTE  Jaime [name] JFH:[id number] DOB: 01-29-1986 DOA: 07/29/2020 PCP: Neale Burly, MD   Brief HPI:   34 year old female with a history of liver cirrhosis secondary to hepatitis C s/p treatment, IV drug use, diabetes mellitus type 2, COPD came to ED with complaints of shortness of breath, lower extremity edema, abdominal distention.  On exam she was found to have ascites however abdominal ultrasound did not show significant fluid to be drained.  She is also hypoxic, CTA could not be obtained due to contrast allergy.  VQ scan was obtained which was negative for pulmonary embolism.  Lower extremity venous duplex also was negative for DVT.    Subjective   Patient seen and examined, she feels much better, she has diuresed well with IV Lasix.  And weight is down to 286 poundsl   Assessment/Plan:     1. Anasarca-secondary to chronic liver disease, patient had 30 pound weight gain in last few weeks, she was started on IV Lasix and has diuresed well.  Weight is down to 286 pounds. 2. Dyspnea-resolved, initially thought to be due to massive ascites however there was not much ascites on ultrasound.  VQ scan was negative for PE, CTA could not be obtained as patient has allergy to dye.  Dyspnea significantly improved with diuresis. 3. Liver cirrhosis-secondary to hepatitis C.  Patient is to follow-up with hepatology as outpatient. 4. Diabetes mellitus type 2-continue sliding scale insulin with NovoLog. 5. Pancytopenia-secondary to hypersplenism/liver cirrhosis. 6. Anxiety disorder-continue clonazepam, venlafaxine 7. Hypertension-blood pressure is stable, continue lisinopril 8. Hypokalemia-replace potassium and follow BMP in am.     COVID-19 Labs  Recent Labs    07/30/20 1515  DDIMER 0.63*    Lab Results  Component Value Date   SARSCOV2NAA NEGATIVE 07/29/2020   [place] Not Detected 07/09/2020   [place] Not Detected 11/14/2019   [place]  NEGATIVE 05/26/2019     Scheduled medications:   . diphenhydrAMINE  50 mg Oral Once   Or  . diphenhydrAMINE  50 mg Intravenous Once  . enoxaparin (LOVENOX) injection  70 mg Subcutaneous Q24H  . fluconazole  400 mg Oral Daily  . furosemide  60 mg Intravenous Q12H  . hydrocortisone sod succinate (SOLU-CORTEF) inj  200 mg Intravenous Once  . insulin aspart  0-9 Units Subcutaneous TID WC  . methadone  15 mg Oral q morning - 10a  . pantoprazole  40 mg Oral BID  . pregabalin  75 mg Oral BID  . QUEtiapine  25 mg Oral QHS  . sodium chloride flush  3 mL Intravenous Q12H  . spironolactone  50 mg Oral Daily  . sucralfate  1 g Oral QID  . venlafaxine XR  225 mg Oral Q breakfast         CBG: Recent Labs  Lab 07/30/20 1854 07/30/20 2020 07/31/20 0754 07/31/20 1132 07/31/20 1618  GLUCAP 117* 172* 111* 226* 196*    SpO2: 95 % O2 Flow Rate (L/min): 2 L/min    CBC: Recent Labs  Lab 07/29/20 1301 07/30/20 0408 07/31/20 1130  WBC 2.3* 2.4* 7.0  NEUTROABS  --   --  5.2  HGB 10.6* 10.2* 13.2  HCT 34.0* 33.8* 41.6  MCV 92.9 94.2 88.7  PLT 76* 75* 140*    Basic Metabolic Panel: Recent Labs  Lab 07/29/20 1301 07/30/20 0408 07/31/20 0651  NA 138 139 136  K 4.0 3.7 3.1*  CL 106 106 92*  CO2 23 26 30   GLUCOSE 129*  105* 92  BUN 11 12 10   CREATININE 0.44 0.47 0.58  CALCIUM 8.6* 8.3* 9.2  MG 1.5* 1.9  --      Liver Function Tests: Recent Labs  Lab 07/29/20 1301 07/30/20 0408  AST 36 32  ALT 35 30  ALKPHOS 112 92  BILITOT 0.6 0.7  PROT 6.8 6.2*  ALBUMIN 3.1* 2.8*     Antibiotics: Anti-infectives (From admission, onward)   Start     Dose/Rate Route Frequency Ordered Stop   07/29/20 1645  fluconazole (DIFLUCAN) tablet 400 mg        400 mg Oral Daily 07/29/20 1630         DVT prophylaxis: Lovenox  Code Status: Full code  Family Communication: No family at bedside    Status is: Inpatient  Dispo: The patient is from: Home              Anticipated  d/c is to: Home              Anticipated d/c date is: 1 to 2 days              Patient currently not medically stable for discharge  Barrier to discharge-IV Lasix for diuresis, potassium being replaced for hypokalemia  Pressure Injury Deep Tissue Injury - Purple or maroon localized area of discolored intact skin or blood-filled blister due to damage of underlying soft tissue from pressure and/or shear. (Active)     Location: Coccyx  Location Orientation: Mid  Staging: Deep Tissue Injury - Purple or maroon localized area of discolored intact skin or blood-filled blister due to damage of underlying soft tissue from pressure and/or shear.  Wound Description (Comments):   Present on Admission: No         Consultants:    Procedures:     Objective   Vitals:   07/31/20 0644 07/31/20 0645 07/31/20 1055 07/31/20 1437  BP: (!) 150/83 140/88  (!) 138/97  Pulse: (!) 112 (!) 110  99  Resp: 18 16    Temp: 97.6 F (36.4 C)   98 F (36.7 C)  TempSrc: Oral   Oral  SpO2: 98% 96%  95%  Weight:   129.8 kg   Height:        Intake/Output Summary (Last 24 hours) at 07/31/2020 1712 Last data filed at 07/31/2020 1300 Gross per 24 hour  Intake 1140 ml  Output 2000 ml  Net -860 ml    09/27 1901 - 09/29 0700 In: -  Out: 1800 [Urine:1800]  Filed Weights   07/29/20 1127 07/31/20 1055  Weight: (!) 137 kg 129.8 kg    Physical Examination:    General: Appears in no acute distress  Cardiovascular: S1-S2, regular, no murmur auscultated  Respiratory: Clear to auscultation bilaterally  Abdomen: Abdomen is soft, nontender, no organomegaly  Extremities: Trace edema in the lower extremities  Neurologic: Cranial nerves II through XII grossly intact, no focal deficit noted    Data Reviewed:   Recent Results (from the past 240 hour(s))  Respiratory Panel by RT PCR (Flu A&B, Covid) - Nasopharyngeal Swab     Status: None   Collection Time: 07/29/20  4:40 PM   Specimen:  Nasopharyngeal Swab  Result Value Ref Range Status   SARS Coronavirus 2 by RT PCR NEGATIVE NEGATIVE Final    Comment: (NOTE) SARS-CoV-2 target nucleic acids are NOT DETECTED.  The SARS-CoV-2 RNA is generally detectable in upper respiratoy specimens during the acute phase of infection. The lowest concentration of  SARS-CoV-2 viral copies this assay can detect is 131 copies/mL. A negative result does not preclude SARS-Cov-2 infection and should not be used as the sole basis for treatment or other patient management decisions. A negative result may occur with  improper specimen collection/handling, submission of specimen other than nasopharyngeal swab, presence of viral mutation(s) within the areas targeted by this assay, and inadequate number of viral copies (<131 copies/mL). A negative result must be combined with clinical observations, patient history, and epidemiological information. The expected result is Negative.  Fact Sheet for Patients:  PinkCheek.be  Fact Sheet for Healthcare Providers:  GravelBags.it  This test is no t yet approved or cleared by the [place] FDA and  has been authorized for detection and/or diagnosis of SARS-CoV-2 by FDA under an Emergency Use Authorization (EUA). This EUA will remain  in effect (meaning this test can be used) for the duration of the COVID-19 declaration under Section 564(b)(1) of the Act, 21 U.S.C. section 360bbb-3(b)(1), unless the authorization is terminated or revoked sooner.     Influenza A by PCR NEGATIVE NEGATIVE Final   Influenza B by PCR NEGATIVE NEGATIVE Final    Comment: (NOTE) The Xpert Xpress SARS-CoV-2/FLU/RSV assay is intended as an aid in  the diagnosis of influenza from Nasopharyngeal swab specimens and  should not be used as a sole basis for treatment. Nasal washings and  aspirates are unacceptable for Xpert Xpress SARS-CoV-2/FLU/RSV  testing.  Fact Sheet  for Patients: PinkCheek.be  Fact Sheet for Healthcare Providers: GravelBags.it  This test is not yet approved or cleared by the [place] FDA and  has been authorized for detection and/or diagnosis of SARS-CoV-2 by  FDA under an Emergency Use Authorization (EUA). This EUA will remain  in effect (meaning this test can be used) for the duration of the  Covid-19 declaration under Section 564(b)(1) of the Act, 21  U.S.C. section 360bbb-3(b)(1), unless the authorization is  terminated or revoked. Performed at [facility], [address], [place], [place] [address]     No results for input(s): LIPASE, AMYLASE in the last 168 hours. No results for input(s): AMMONIA in the last 168 hours.  Cardiac Enzymes: No results for input(s): CKTOTAL, CKMB, CKMBINDEX, TROPONINI in the last 168 hours. BNP (last 3 results) Recent Labs    07/29/20 1301  BNP 26.0    ProBNP (last 3 results) No results for input(s): PROBNP in the last 8760 hours.  Studies:  NM Pulmonary Perfusion  Result Date: 07/30/2020 CLINICAL DATA:  PE suspected, high prob Chest pain and shortness of breath. EXAM: NUCLEAR MEDICINE PERFUSION LUNG SCAN TECHNIQUE: Perfusion images were obtained in multiple projections after intravenous injection of radiopharmaceutical. Ventilation scans intentionally deferred if perfusion scan and chest x-ray adequate for interpretation during COVID 19 epidemic. RADIOPHARMACEUTICALS:  4.1 mCi Tc-52mMAA IV COMPARISON:  Chest radiograph and CT yesterday. FINDINGS: There is a homogeneous distribution of radiotracer throughout both lungs. No evidence of perfusion defects. IMPRESSION: No evidence of pulmonary embolus.  Normal perfusion exam. Electronically Signed   By: MKeith RakeM.D.   On: 07/30/2020 18:50   [place] Img Lower Bilateral (DVT)  Result Date: 07/30/2020 CLINICAL DATA:  Lower extremity edema EXAM: BILATERAL LOWER EXTREMITY  VENOUS DOPPLER ULTRASOUND TECHNIQUE: Gray-scale sonography with graded compression, as well as color Doppler and duplex ultrasound were performed to evaluate the lower extremity deep venous systems from the level of the common femoral vein and including the common femoral, femoral, profunda femoral, popliteal and calf veins  including the posterior tibial, peroneal and gastrocnemius veins when visible. The superficial great saphenous vein was also interrogated. Spectral Doppler was utilized to evaluate flow at rest and with distal augmentation maneuvers in the common femoral, femoral and popliteal veins. COMPARISON:  None. FINDINGS: RIGHT LOWER EXTREMITY Common Femoral Vein: No evidence of thrombus. Normal compressibility, respiratory phasicity and response to augmentation. Saphenofemoral Junction: No evidence of thrombus. Normal compressibility and flow on color Doppler imaging. Profunda Femoral Vein: No evidence of thrombus. Normal compressibility and flow on color Doppler imaging. Femoral Vein: No evidence of thrombus. Normal compressibility, respiratory phasicity and response to augmentation. Popliteal Vein: No evidence of thrombus. Normal compressibility, respiratory phasicity and response to augmentation. Calf Veins: No evidence of thrombus. Normal compressibility and flow on color Doppler imaging. LEFT LOWER EXTREMITY Common Femoral Vein: No evidence of thrombus. Normal compressibility, respiratory phasicity and response to augmentation. Saphenofemoral Junction: No evidence of thrombus. Normal compressibility and flow on color Doppler imaging. Profunda Femoral Vein: No evidence of thrombus. Normal compressibility and flow on color Doppler imaging. Femoral Vein: No evidence of thrombus. Normal compressibility, respiratory phasicity and response to augmentation. Popliteal Vein: No evidence of thrombus. Normal compressibility, respiratory phasicity and response to augmentation. Calf Veins: No evidence of  thrombus. Normal compressibility and flow on color Doppler imaging. IMPRESSION: Limited exam because of body habitus. No evidence of significant deep venous thrombosis in either lower extremity. Electronically Signed   By: Jerilynn Mages.  Shick M.D.   On: 07/30/2020 15:44   ECHOCARDIOGRAM COMPLETE  Result Date: 07/31/2020    ECHOCARDIOGRAM REPORT   Patient Name:   Jaime [name] Date of Exam: 07/31/2020 Medical Rec #:  [id number]        Height:       69.0 in Accession #:    [id number]       Weight:       302.0 lb Date of Birth:  Jan 17, 1986         BSA:          2.462 m Patient Age:    21 years         BP:           140/88 mmHg Patient Gender: F                HR:           108 bpm. Exam Location:  Inpatient Procedure: 2D Echo, Cardiac Doppler and Color Doppler Indications:    CHF-Acute Diastolic 466.59 / D35.70  History:        Patient has prior history of Echocardiogram examinations, most                 recent 07/05/2019. COPD; Risk Factors:Diabetes.  Sonographer:    Bernadene Person RDCS Referring Phys: [id number] AMRIT ADHIKARI  Sonographer Comments: Technically difficult study due to poor echo windows. IMPRESSIONS  1. Left ventricular ejection fraction, by estimation, is 55 to 60%. The left ventricle has normal function. Left ventricular endocardial border not optimally defined to evaluate regional wall motion. Left ventricular diastolic parameters are indeterminate.  2. Right ventricular systolic function is normal. The right ventricular size is normal.  3. The mitral valve is grossly normal. Trivial mitral valve regurgitation.  4. The aortic valve was not well visualized. Aortic valve regurgitation is not visualized.  5. Unable to estimate CVP. FINDINGS  Left Ventricle: Left ventricular ejection fraction, by estimation, is 55 to 60%. The left ventricle has normal function. Left ventricular endocardial border not optimally  defined to evaluate regional wall motion. The left ventricular internal cavity size was normal in size.  There is no left ventricular hypertrophy. Left ventricular diastolic parameters are indeterminate. Right Ventricle: The right ventricular size is normal. No increase in right ventricular wall thickness. Right ventricular systolic function is normal. Left Atrium: Left atrial size was normal in size. Right Atrium: Right atrial size was normal in size. Pericardium: There is no evidence of pericardial effusion. Presence of pericardial fat pad. Mitral Valve: The mitral valve is grossly normal. Trivial mitral valve regurgitation. Tricuspid Valve: The tricuspid valve is grossly normal. Tricuspid valve regurgitation is trivial. Aortic Valve: The aortic valve was not well visualized. Aortic valve regurgitation is not visualized. Pulmonic Valve: The pulmonic valve was not well visualized. Pulmonic valve regurgitation is not visualized. Aorta: The aortic root is normal in size and structure. Venous: Unable to estimate CVP. The inferior vena cava was not well visualized. IAS/Shunts: The interatrial septum was not well visualized.  LEFT VENTRICLE PLAX 2D LVIDd:         5.74 cm  Diastology LVIDs:         3.97 cm  LV e' medial:    8.70 cm/s LV PW:         0.82 cm  LV E/e' medial:  9.6 LV IVS:        0.78 cm  LV e' lateral:   15.20 cm/s LVOT diam:     1.90 cm  LV E/e' lateral: 5.5 LV SV:         79 LV SV Index:   32 LVOT Area:     2.84 cm  RIGHT VENTRICLE RV S prime:     10.80 cm/s TAPSE (M-mode): 2.8 cm LEFT ATRIUM             Index       RIGHT ATRIUM           Index LA diam:        3.80 cm 1.54 cm/m  RA Area:     10.90 cm LA Vol (A2C):   26.9 ml 10.93 ml/m RA Volume:   21.40 ml  8.69 ml/m LA Vol (A4C):   46.4 ml 18.85 ml/m LA Biplane Vol: 36.8 ml 14.95 ml/m  AORTIC VALVE LVOT Vmax:   155.00 cm/s LVOT Vmean:  117.000 cm/s LVOT VTI:    0.278 m  AORTA Ao Root diam: 3.10 cm MITRAL VALVE               TRICUSPID VALVE MV Area (PHT): 4.29 cm    TR Peak grad:   11.2 mmHg MV Decel Time: 177 msec    TR Vmax:        167.00 cm/s MV E  velocity: 83.90 cm/s MV A velocity: 85.90 cm/s  SHUNTS MV E/A ratio:  0.98        Systemic VTI:  0.28 m                            Systemic Diam: 1.90 cm Rozann Lesches MD Electronically signed by Rozann Lesches MD Signature Date/Time: 07/31/2020/11:51:14 AM    Final    [place] ASCITES (ABDOMEN LIMITED)  Result Date: 07/30/2020 CLINICAL DATA:  Cirrhosis, ascites EXAM: LIMITED ABDOMEN ULTRASOUND FOR ASCITES TECHNIQUE: Limited ultrasound survey for ascites was performed in all four abdominal quadrants. COMPARISON:  07/29/2020 FINDINGS: Trace ascites. Insufficient ascites for paracentesis. IMPRESSION: Trace ascites, insufficient for paracentesis. Electronically Signed   By:  Lavonia Dana M.D.   On: 07/30/2020 10:37       [place]   [facility] If 7PM-7AM, please contact night-coverage at [email or web address], Office  [phone]   07/31/2020, 5:12 PM  LOS: 2 days

## 2020-08-01 ENCOUNTER — Ambulatory Visit: Payer: Medicaid Other | Admitting: Nurse Practitioner

## 2020-08-01 ENCOUNTER — Ambulatory Visit: Payer: Medicaid Other | Admitting: Nutrition

## 2020-08-01 LAB — BASIC METABOLIC PANEL
Anion gap: 12 (ref 5–15)
BUN: 16 mg/dL (ref 6–20)
CO2: 29 mmol/L (ref 22–32)
Calcium: 9.8 mg/dL (ref 8.9–10.3)
Chloride: 94 mmol/L — ABNORMAL LOW (ref 98–111)
Creatinine, Ser: 0.7 mg/dL (ref 0.44–1.00)
GFR calc Af Amer: >60 mL/min (ref >60)
GFR calc non Af Amer: >60 mL/min (ref >60)
Glucose, Bld: 179 mg/dL — ABNORMAL HIGH (ref 70–99)
Potassium: 3.9 mmol/L (ref 3.5–5.1)
Sodium: 135 mmol/L (ref 135–145)

## 2020-08-01 LAB — GLUCOSE, CAPILLARY
Glucose-Capillary: 194 mg/dL — ABNORMAL HIGH (ref 70–99)
Glucose-Capillary: 242 mg/dL — ABNORMAL HIGH (ref 70–99)

## 2020-08-01 MED ORDER — PNEUMOCOCCAL VAC POLYVALENT 25 MCG/0.5ML IJ INJ: 0.5000 mL | INJECTION | INTRAMUSCULAR | Status: DC

## 2020-08-01 MED ORDER — PNEUMOCOCCAL VAC POLYVALENT 25 MCG/0.5ML IJ INJ
0.5000 mL | INJECTION | Freq: Once | INTRAMUSCULAR | Status: AC
  Administered 2020-08-01: 0.5 mL via INTRAMUSCULAR
  Filled 2020-08-01: qty 0.5

## 2020-08-01 NOTE — Discharge Summary (Signed)
Physician Discharge Summary  Jaime [name] CLE:[id number] DOB: 04-Mar-1986 DOA: 07/29/2020  PCP: Neale Burly, MD  Admit date: 07/29/2020 Discharge date: 08/01/2020  Time spent: 50* minutes  Recommendations for Outpatient Follow-up:  1. Follow-up PCP in 1 week  Discharge Diagnoses:  Active Problems:   Dyspnea   Discharge Condition: Stable  Diet recommendation: Low-salt diet  Filed Weights   07/29/20 1127 07/31/20 1055  Weight: (!) 137 kg 129.8 kg    History of present illness:  34 year old female with a history of liver cirrhosis secondary to hepatitis C s/p treatment, IV drug use, diabetes mellitus type 2, COPD came to ED with complaints of shortness of breath, lower extremity edema, abdominal distention.  On exam she was found to have ascites however abdominal ultrasound did not show significant fluid to be drained.  She is also hypoxic, CTA could not be obtained due to contrast allergy.  VQ scan was obtained which was negative for pulmonary embolism.  Lower extremity venous duplex also was negative for DVT.  Hospital Course:   1. Anasarca-secondary to chronic liver disease, patient had 30 pound weight gain in last few weeks, she was started on IV Lasix and has diuresed well.  Weight is down to 286 pounds.  Patient was discharged on home dose of Lasix 40 mg p.o. twice daily.  Continue Aldactone 100 mg daily. 2. Dyspnea-resolved, initially thought to be due to massive ascites however there was not much ascites on ultrasound.  VQ scan was negative for PE, CTA could not be obtained as patient has allergy to dye.  Dyspnea significantly improved with diuresis. 3. Liver cirrhosis-secondary to hepatitis C.  Patient is to follow-up with hepatology as outpatient. 4. Diabetes mellitus type 2-continue home medications. 5. Pancytopenia-secondary to hypersplenism/liver cirrhosis. 6. Anxiety disorder-continue clonazepam, venlafaxine 7. Hypertension-blood pressure is stable, continue  lisinopril 8. Hypokalemia-replete.   Procedures:    Consultations:    Discharge Exam: Vitals:   07/31/20 2131 08/01/20 0500  BP: (!) 146/101 (!) 146/80  Pulse: (!) 122 (!) 120  Resp: 18 18  Temp: 98.2 F (36.8 C) 98.6 F (37 C)  SpO2: 95% 94%    General: Appears in no acute distress Cardiovascular: S1-S2, regular, no murmur auscultated Respiratory: Clear to auscultation bilaterally  Discharge Instructions   Discharge Instructions    Ambulatory referral to Physical Therapy   Complete by: As directed    Diet - low sodium heart healthy   Complete by: As directed    Increase activity slowly   Complete by: As directed      Allergies as of 08/01/2020      Reactions   Iodine-131 Anaphylaxis, Shortness Of Breath, Swelling   Ivp Dye [iodinated Diagnostic Agents] Anaphylaxis   Skin gets very red, unable to walk   Ketorolac Tromethamine Shortness Of Breath   Tylenol [acetaminophen] Other (See Comments)   Due to cirrhosis   Gabapentin    Morphine And Related    Nsaids Other (See Comments)   Flares ulcers   Suboxone [buprenorphine Hcl-naloxone Hcl]    Vancomycin Swelling   Facial swelling,       Medication List    STOP taking these medications   hyoscyamine 0.125 MG SL tablet Commonly known as: LEVSIN SL   ondansetron 4 MG tablet Commonly known as: ZOFRAN   predniSONE 10 MG tablet Commonly known as: DELTASONE     TAKE these medications   Accu-Chek Guide test strip Generic drug: glucose blood Use as instructed to check blood glucose  four times daily   Accu-Chek Softclix Lancets lancets 4 (four) times daily.   albuterol (2.5 MG/3ML) 0.083% nebulizer solution Commonly known as: PROVENTIL Take 2.5 mg by nebulization every 6 (six) hours as needed for wheezing or shortness of breath.   aspirin EC 81 MG tablet Take 162 mg by mouth daily. Swallow whole.   benzonatate 100 MG capsule Commonly known as: TESSALON Take 1 capsule (100 mg total) by mouth  every 8 (eight) hours.   cetirizine 10 MG tablet Commonly known as: ZyrTEC Allergy Take 1 tablet (10 mg total) by mouth daily.   clonazePAM 0.5 MG tablet Commonly known as: KlonoPIN Take 1 tablet (0.5 mg total) by mouth 2 (two) times daily. Start taking on: August 07, 2020   cyclobenzaprine 10 MG tablet Commonly known as: FLEXERIL Take 1 tablet (10 mg total) by mouth at bedtime. What changed: when to take this   Dexcom G6 Sensor Misc 4 Pieces by Does not apply route once a week.   Dexcom G6 Transmitter Misc 1 Piece by Does not apply route as directed.   dicyclomine 10 MG capsule Commonly known as: BENTYL Take 10 mg by mouth 3 (three) times daily as needed.   docusate sodium 100 MG capsule Commonly known as: COLACE Take 100 mg by mouth 4 (four) times daily.   fluconazole 200 MG tablet Commonly known as: DIFLUCAN Take 400 mg by mouth daily.   fluticasone 50 MCG/ACT nasal spray Commonly known as: FLONASE Place 1 spray into both nostrils daily for 14 days.   furosemide 40 MG tablet Commonly known as: LASIX Take 2 tablets by mouth daily.   insulin isophane & regular human (70-30) 100 UNIT/ML KwikPen Commonly known as: HUMULIN 70/30 MIX Inject 90 Units into the skin 2 (two) times daily with a meal.   lisinopril 5 MG tablet Commonly known as: ZESTRIL Take 1 tablet (5 mg total) by mouth daily.   metFORMIN 1000 MG tablet Commonly known as: GLUCOPHAGE Take 1 tablet (1,000 mg total) by mouth 2 (two) times daily with a meal.   methadone 10 MG/5ML solution Commonly known as: DOLOPHINE Take 15 mg by mouth every morning.   multivitamin tablet Take 1 tablet by mouth daily.   Nexplanon 68 MG Impl implant Generic drug: etonogestrel 1 each by Subdermal route once.   nystatin 100000 UNIT/ML suspension Commonly known as: MYCOSTATIN Take 5 mLs (500,000 Units total) by mouth 4 (four) times daily.   ondansetron 4 MG disintegrating tablet Commonly known as:  ZOFRAN-ODT Take 4 mg by mouth every 8 (eight) hours as needed.   pantoprazole 40 MG tablet Commonly known as: PROTONIX Take 40 mg by mouth 2 (two) times daily.   Pen Needles 32G X 6 MM Misc 1 each by Does not apply route 3 (three) times daily.   potassium chloride 10 MEQ tablet Commonly known as: KLOR-CON Take 1 tablet by mouth daily.   pregabalin 75 MG capsule Commonly known as: LYRICA Take 75 mg by mouth 2 (two) times daily.   psyllium 0.52 g capsule Commonly known as: REGULOID Take 0.52 g by mouth 4 (four) times daily.   QUEtiapine 25 MG tablet Commonly known as: SEROQUEL Take 1 tablet (25 mg total) by mouth at bedtime.   Restasis 0.05 % ophthalmic emulsion Generic drug: cycloSPORINE Place 1 drop into both eyes as needed.   spironolactone 100 MG tablet Commonly known as: ALDACTONE Take 1 tablet by mouth daily.   sucralfate 1 g tablet Commonly known as: CARAFATE Take  1 g by mouth 4 (four) times daily.   venlafaxine XR 150 MG 24 hr capsule Commonly known as: EFFEXOR-XR Take total of 225 mg daily (150 mg + 75 mg )   Victoza 18 MG/3ML Sopn Generic drug: liraglutide Inject 1.8 mg into the skin daily.      Allergies  Allergen Reactions  . Iodine-131 Anaphylaxis, Shortness Of Breath and Swelling  . Ivp Dye [Iodinated Diagnostic Agents] Anaphylaxis    Skin gets very red, unable to walk   . Ketorolac Tromethamine Shortness Of Breath  . Tylenol [Acetaminophen] Other (See Comments)    Due to cirrhosis   . Gabapentin   . Morphine And Related   . Nsaids Other (See Comments)    Flares ulcers  . Suboxone [Buprenorphine Hcl-Naloxone Hcl]   . Vancomycin Swelling    Facial swelling,     Follow-up Information    Neale Burly, MD. Go on 08/07/2020.   Specialty: Internal Medicine Why: 11:00 appointment.  Contact information: [place] [phone]                The results of significant diagnostics from this  hospitalization (including imaging, microbiology, ancillary and laboratory) are listed below for reference.    Significant Diagnostic Studies: DG Chest 2 View  Result Date: 07/29/2020 CLINICAL DATA:  Shortness of breath, chest pain EXAM: CHEST - 2 VIEW COMPARISON:  05/26/2019 FINDINGS: The heart size and mediastinal contours are within normal limits. Slightly low lung volumes with mild bibasilar atelectasis. No focal airspace consolidation, pleural effusion, or pneumothorax. The visualized skeletal structures are unremarkable. IMPRESSION: Slightly low lung volumes with mild bibasilar atelectasis. Otherwise, no acute cardiopulmonary findings. Electronically Signed   By: Davina Poke D.O.   On: 07/29/2020 11:55   CT Chest Wo Contrast  Result Date: 07/29/2020 CLINICAL DATA:  Shortness of breath and chest pain. EXAM: CT CHEST WITHOUT CONTRAST TECHNIQUE: Multidetector CT imaging of the chest was performed following the standard protocol without IV contrast. COMPARISON:  July 17, 2016 FINDINGS: Cardiovascular: No significant vascular findings. Normal heart size. No pericardial effusion. Mediastinum/Nodes: No enlarged mediastinal or axillary lymph nodes. Thyroid gland, trachea, and esophagus demonstrate no significant findings. Lungs/Pleura: A mild amount of atelectasis is seen along the inferior aspect of the left upper lobe. There is no evidence of a pleural effusion or pneumothorax. Upper Abdomen: Multiple surgical clips are seen within the gallbladder fossa. The liver is moderately enlarged. The spleen is markedly enlarged. Musculoskeletal: No chest wall mass or suspicious bone lesions identified. IMPRESSION: 1. Mild inferior left upper lobe atelectasis. 2. Hepatosplenomegaly. 3. Evidence of prior cholecystectomy. Electronically Signed   By: Virgina Norfolk M.D.   On: 07/29/2020 15:46   NM Pulmonary Perfusion  Result Date: 07/30/2020 CLINICAL DATA:  PE suspected, high prob Chest pain and  shortness of breath. EXAM: NUCLEAR MEDICINE PERFUSION LUNG SCAN TECHNIQUE: Perfusion images were obtained in multiple projections after intravenous injection of radiopharmaceutical. Ventilation scans intentionally deferred if perfusion scan and chest x-ray adequate for interpretation during COVID 19 epidemic. RADIOPHARMACEUTICALS:  4.1 mCi Tc-46mMAA IV COMPARISON:  Chest radiograph and CT yesterday. FINDINGS: There is a homogeneous distribution of radiotracer throughout both lungs. No evidence of perfusion defects. IMPRESSION: No evidence of pulmonary embolus.  Normal perfusion exam. Electronically Signed   By: MKeith RakeM.D.   On: 07/30/2020 18:50   [place]Venous Img Lower Bilateral (DVT)  Result Date: 07/30/2020 CLINICAL DATA:  Lower extremity edema EXAM: BILATERAL  LOWER EXTREMITY VENOUS DOPPLER ULTRASOUND TECHNIQUE: Gray-scale sonography with graded compression, as well as color Doppler and duplex ultrasound were performed to evaluate the lower extremity deep venous systems from the level of the common femoral vein and including the common femoral, femoral, profunda femoral, popliteal and calf veins including the posterior tibial, peroneal and gastrocnemius veins when visible. The superficial great saphenous vein was also interrogated. Spectral Doppler was utilized to evaluate flow at rest and with distal augmentation maneuvers in the common femoral, femoral and popliteal veins. COMPARISON:  None. FINDINGS: RIGHT LOWER EXTREMITY Common Femoral Vein: No evidence of thrombus. Normal compressibility, respiratory phasicity and response to augmentation. Saphenofemoral Junction: No evidence of thrombus. Normal compressibility and flow on color Doppler imaging. Profunda Femoral Vein: No evidence of thrombus. Normal compressibility and flow on color Doppler imaging. Femoral Vein: No evidence of thrombus. Normal compressibility, respiratory phasicity and response to augmentation. Popliteal Vein: No evidence of  thrombus. Normal compressibility, respiratory phasicity and response to augmentation. Calf Veins: No evidence of thrombus. Normal compressibility and flow on color Doppler imaging. LEFT LOWER EXTREMITY Common Femoral Vein: No evidence of thrombus. Normal compressibility, respiratory phasicity and response to augmentation. Saphenofemoral Junction: No evidence of thrombus. Normal compressibility and flow on color Doppler imaging. Profunda Femoral Vein: No evidence of thrombus. Normal compressibility and flow on color Doppler imaging. Femoral Vein: No evidence of thrombus. Normal compressibility, respiratory phasicity and response to augmentation. Popliteal Vein: No evidence of thrombus. Normal compressibility, respiratory phasicity and response to augmentation. Calf Veins: No evidence of thrombus. Normal compressibility and flow on color Doppler imaging. IMPRESSION: Limited exam because of body habitus. No evidence of significant deep venous thrombosis in either lower extremity. Electronically Signed   By: Jerilynn Mages.  Shick M.D.   On: 07/30/2020 15:44   ECHOCARDIOGRAM COMPLETE  Result Date: 07/31/2020    ECHOCARDIOGRAM REPORT   Patient Name:   LETHIA DONLON Date of Exam: 07/31/2020 Medical Rec #:  [id number]        Height:       69.0 in Accession #:    [id number]       Weight:       302.0 lb Date of Birth:  September 28, 1986         BSA:          2.462 m Patient Age:    80 years         BP:           140/88 mmHg Patient Gender: F                HR:           108 bpm. Exam Location:  Inpatient Procedure: 2D Echo, Cardiac Doppler and Color Doppler Indications:    CHF-Acute Diastolic 390.30 / S92.33  History:        Patient has prior history of Echocardiogram examinations, most                 recent 07/05/2019. COPD; Risk Factors:Diabetes.  Sonographer:    Bernadene Person RDCS Referring Phys: [id number] AMRIT ADHIKARI  Sonographer Comments: Technically difficult study due to poor echo windows. IMPRESSIONS  1. Left ventricular ejection  fraction, by estimation, is 55 to 60%. The left ventricle has normal function. Left ventricular endocardial border not optimally defined to evaluate regional wall motion. Left ventricular diastolic parameters are indeterminate.  2. Right ventricular systolic function is normal. The right ventricular size is normal.  3. The mitral valve is grossly normal. Trivial  mitral valve regurgitation.  4. The aortic valve was not well visualized. Aortic valve regurgitation is not visualized.  5. Unable to estimate CVP. FINDINGS  Left Ventricle: Left ventricular ejection fraction, by estimation, is 55 to 60%. The left ventricle has normal function. Left ventricular endocardial border not optimally defined to evaluate regional wall motion. The left ventricular internal cavity size was normal in size. There is no left ventricular hypertrophy. Left ventricular diastolic parameters are indeterminate. Right Ventricle: The right ventricular size is normal. No increase in right ventricular wall thickness. Right ventricular systolic function is normal. Left Atrium: Left atrial size was normal in size. Right Atrium: Right atrial size was normal in size. Pericardium: There is no evidence of pericardial effusion. Presence of pericardial fat pad. Mitral Valve: The mitral valve is grossly normal. Trivial mitral valve regurgitation. Tricuspid Valve: The tricuspid valve is grossly normal. Tricuspid valve regurgitation is trivial. Aortic Valve: The aortic valve was not well visualized. Aortic valve regurgitation is not visualized. Pulmonic Valve: The pulmonic valve was not well visualized. Pulmonic valve regurgitation is not visualized. Aorta: The aortic root is normal in size and structure. Venous: Unable to estimate CVP. The inferior vena cava was not well visualized. IAS/Shunts: The interatrial septum was not well visualized.  LEFT VENTRICLE PLAX 2D LVIDd:         5.74 cm  Diastology LVIDs:         3.97 cm  LV e' medial:    8.70 cm/s LV PW:          0.82 cm  LV E/e' medial:  9.6 LV IVS:        0.78 cm  LV e' lateral:   15.20 cm/s LVOT diam:     1.90 cm  LV E/e' lateral: 5.5 LV SV:         79 LV SV Index:   32 LVOT Area:     2.84 cm  RIGHT VENTRICLE RV S prime:     10.80 cm/s TAPSE (M-mode): 2.8 cm LEFT ATRIUM             Index       RIGHT ATRIUM           Index LA diam:        3.80 cm 1.54 cm/m  RA Area:     10.90 cm LA Vol (A2C):   26.9 ml 10.93 ml/m RA Volume:   21.40 ml  8.69 ml/m LA Vol (A4C):   46.4 ml 18.85 ml/m LA Biplane Vol: 36.8 ml 14.95 ml/m  AORTIC VALVE LVOT Vmax:   155.00 cm/s LVOT Vmean:  117.000 cm/s LVOT VTI:    0.278 m  AORTA Ao Root diam: 3.10 cm MITRAL VALVE               TRICUSPID VALVE MV Area (PHT): 4.29 cm    TR Peak grad:   11.2 mmHg MV Decel Time: 177 msec    TR Vmax:        167.00 cm/s MV E velocity: 83.90 cm/s MV A velocity: 85.90 cm/s  SHUNTS MV E/A ratio:  0.98        Systemic VTI:  0.28 m                            Systemic Diam: 1.90 cm Rozann Lesches MD Electronically signed by Rozann Lesches MD Signature Date/Time: 07/31/2020/11:51:14 AM    Final    [place] ASCITES (ABDOMEN LIMITED)  Result Date: 07/30/2020 CLINICAL DATA:  Cirrhosis, ascites EXAM: LIMITED ABDOMEN ULTRASOUND FOR ASCITES TECHNIQUE: Limited ultrasound survey for ascites was performed in all four abdominal quadrants. COMPARISON:  07/29/2020 FINDINGS: Trace ascites. Insufficient ascites for paracentesis. IMPRESSION: Trace ascites, insufficient for paracentesis. Electronically Signed   By: Lavonia Dana M.D.   On: 07/30/2020 10:37   [place] ASCITES (ABDOMEN LIMITED)  Result Date: 07/29/2020 CLINICAL DATA:  Alcoholic cirrhosis, abdominal swelling EXAM: LIMITED ABDOMEN ULTRASOUND FOR ASCITES TECHNIQUE: Limited ultrasound survey for ascites was performed in all four abdominal quadrants. COMPARISON:  CT abdomen and pelvis 12/04/2019 FINDINGS: Minimal ascites identified. Insufficient ascites for paracentesis. IMPRESSION: Insufficient ascites for paracentesis.  Electronically Signed   By: Lavonia Dana M.D.   On: 07/29/2020 16:57    Microbiology: Recent Results (from the past 240 hour(s))  Respiratory Panel by RT PCR (Flu A&B, Covid) - Nasopharyngeal Swab     Status: None   Collection Time: 07/29/20  4:40 PM   Specimen: Nasopharyngeal Swab  Result Value Ref Range Status   SARS Coronavirus 2 by RT PCR NEGATIVE NEGATIVE Final    Comment: (NOTE) SARS-CoV-2 target nucleic acids are NOT DETECTED.  The SARS-CoV-2 RNA is generally detectable in upper respiratoy specimens during the acute phase of infection. The lowest concentration of SARS-CoV-2 viral copies this assay can detect is 131 copies/mL. A negative result does not preclude SARS-Cov-2 infection and should not be used as the sole basis for treatment or other patient management decisions. A negative result may occur with  improper specimen collection/handling, submission of specimen other than nasopharyngeal swab, presence of viral mutation(s) within the areas targeted by this assay, and inadequate number of viral copies (<131 copies/mL). A negative result must be combined with clinical observations, patient history, and epidemiological information. The expected result is Negative.  Fact Sheet for Patients:  PinkCheek.be  Fact Sheet for Healthcare Providers:  GravelBags.it  This test is no t yet approved or cleared by the [place] FDA and  has been authorized for detection and/or diagnosis of SARS-CoV-2 by FDA under an Emergency Use Authorization (EUA). This EUA will remain  in effect (meaning this test can be used) for the duration of the COVID-19 declaration under Section 564(b)(1) of the Act, 21 U.S.C. section 360bbb-3(b)(1), unless the authorization is terminated or revoked sooner.     Influenza A by PCR NEGATIVE NEGATIVE Final   Influenza B by PCR NEGATIVE NEGATIVE Final    Comment: (NOTE) The Xpert Xpress  SARS-CoV-2/FLU/RSV assay is intended as an aid in  the diagnosis of influenza from Nasopharyngeal swab specimens and  should not be used as a sole basis for treatment. Nasal washings and  aspirates are unacceptable for Xpert Xpress SARS-CoV-2/FLU/RSV  testing.  Fact Sheet for Patients: PinkCheek.be  Fact Sheet for Healthcare Providers: GravelBags.it  This test is not yet approved or cleared by the [place] FDA and  has been authorized for detection and/or diagnosis of SARS-CoV-2 by  FDA under an Emergency Use Authorization (EUA). This EUA will remain  in effect (meaning this test can be used) for the duration of the  Covid-19 declaration under Section 564(b)(1) of the Act, 21  U.S.C. section 360bbb-3(b)(1), unless the authorization is  terminated or revoked. Performed at [facility], [address] [place], [place] [address]      Labs: Basic Metabolic Panel: Recent Labs  Lab 07/29/20 1301 07/30/20 0408 07/31/20 0651 08/01/20 0704  NA 138 139 136 135  K 4.0 3.7 3.1* 3.9  CL  106 106 92* 94*  CO2 23 26 30 29   GLUCOSE 129* 105* 92 179*  BUN 11 12 10 16   CREATININE 0.44 0.47 0.58 0.70  CALCIUM 8.6* 8.3* 9.2 9.8  MG 1.5* 1.9  --   --    Liver Function Tests: Recent Labs  Lab 07/29/20 1301 07/30/20 0408  AST 36 32  ALT 35 30  ALKPHOS 112 92  BILITOT 0.6 0.7  PROT 6.8 6.2*  ALBUMIN 3.1* 2.8*   No results for input(s): LIPASE, AMYLASE in the last 168 hours. No results for input(s): AMMONIA in the last 168 hours. CBC: Recent Labs  Lab 07/29/20 1301 07/30/20 0408 07/31/20 1130  WBC 2.3* 2.4* 7.0  NEUTROABS  --   --  5.2  HGB 10.6* 10.2* 13.2  HCT 34.0* 33.8* 41.6  MCV 92.9 94.2 88.7  PLT 76* 75* 140*    BNP (last 3 results) Recent Labs    07/29/20 1301  BNP 26.0     CBG: Recent Labs  Lab 07/31/20 1132 07/31/20 1618 07/31/20 2119 08/01/20 0719 08/01/20 1101  GLUCAP 226* 196* 204*  194* 242*       Signed:  Oswald Hillock MD.  [facility] 08/01/2020, 11:33 AM

## 2020-08-01 NOTE — Plan of Care (Addendum)
Pt discharged home with grandfather at bedside. Education complete and pt expresses understanding without further questions. IV and foley removed.

## 2020-08-06 ENCOUNTER — Ambulatory Visit (INDEPENDENT_AMBULATORY_CARE_PROVIDER_SITE_OTHER): Payer: Medicaid Other | Admitting: Nurse Practitioner

## 2020-08-06 ENCOUNTER — Ambulatory Visit: Payer: Medicaid Other | Admitting: Nurse Practitioner

## 2020-08-06 ENCOUNTER — Other Ambulatory Visit: Payer: Self-pay

## 2020-08-06 ENCOUNTER — Encounter: Payer: Self-pay | Admitting: Nurse Practitioner

## 2020-08-06 VITALS — BP 123/72 | HR 132 | Wt 294.4 lb

## 2020-08-06 DIAGNOSIS — E1165 Type 2 diabetes mellitus with hyperglycemia: Secondary | ICD-10-CM

## 2020-08-06 DIAGNOSIS — E782 Mixed hyperlipidemia: Secondary | ICD-10-CM

## 2020-08-06 MED ORDER — ROSUVASTATIN CALCIUM 5 MG PO TABS: 5.0000 mg | ORAL_TABLET | Freq: Every day | ORAL | 3 refills | Status: DC

## 2020-08-06 MED ORDER — INSULIN ISOPHANE & REGULAR (HUMAN 70-30)100 UNIT/ML KWIKPEN: PEN_INJECTOR | SUBCUTANEOUS | 6 refills | Status: DC

## 2020-08-06 NOTE — Progress Notes (Signed)
08/06/2020, 10:47 AM     Endocrinology Follow Up Visit  Subjective:    Patient ID: Jaime [name], female    DOB: [birth year].  [facility] is being seen in follow up for management of currently uncontrolled symptomatic diabetes requested by  Neale Burly, MD.   Past Medical History:  Diagnosis Date  . Anxiety   . Asthma   . Chronic abdominal pain   . Chronic back pain   . Cirrhosis ([place])   . Cirrhosis ([place])   . COPD (chronic obstructive pulmonary disease) ([place])   . Depression   . Diabetes mellitus without complication ([place])   . Diabetes mellitus, type II ([place])   . Hepatitis C   . Insomnia   . Long-term current use of methadone for opiate dependence ([place])   . Lupus ([place])   . Migraine headache   . Neuropathy   . Nocturnal seizures ([place])   . Peptic ulcer   . Spleen enlarged     Past Surgical History:  Procedure Laterality Date  . CHOLECYSTECTOMY      Social History   Socioeconomic History  . Marital status: Single    Spouse name: Not on file  . Number of children: Not on file  . Years of education: Not on file  . Highest education level: Not on file  Occupational History  . Not on file  Tobacco Use  . Smoking status: Current Every Day Smoker    Packs/day: 0.00    Years: 5.00    Pack years: 0.00    Types: E-cigarettes  . Smokeless tobacco: Never Used  Vaping Use  . Vaping Use: Every day  . Substances: Nicotine, Flavoring  Substance and Sexual Activity  . Alcohol use: No  . Drug use: No  . Sexual activity: Not Currently    Birth control/protection: Implant  Other Topics Concern  . Not on file  Social History Narrative  . Not on file   Social Determinants of Health   Financial Resource Strain: Medium Risk  . Difficulty of Paying Living Expenses: Somewhat hard  Food Insecurity: No Food Insecurity  . Worried About Charity fundraiser in the Last Year:  Never true  . Ran Out of Food in the Last Year: Never true  Transportation Needs: No Transportation Needs  . Lack of Transportation (Medical): No  . Lack of Transportation (Non-Medical): No  Physical Activity: Insufficiently Active  . Days of Exercise per Week: 1 day  . Minutes of Exercise per Session: 20 min  Stress: Stress Concern Present  . Feeling of Stress : Very much  Social Connections: Moderately Isolated  . Frequency of Communication with Friends and Family: More than three times a week  . Frequency of Social Gatherings with Friends and Family: Twice a week  . Attends Religious Services: More than 4 times per year  . Active Member of Clubs or Organizations: No  . Attends Archivist Meetings: Never  . Marital Status: Never married    Family History  Problem Relation Age of Onset  . Hypertension Mother   . Hyperlipidemia Mother   .  Hyperlipidemia Maternal Grandfather     Outpatient Encounter Medications as of 08/06/2020  Medication Sig  . Accu-Chek Softclix Lancets lancets 4 (four) times daily.  Marland Kitchen albuterol (PROVENTIL) (2.5 MG/3ML) 0.083% nebulizer solution Take 2.5 mg by nebulization every 6 (six) hours as needed for wheezing or shortness of breath.  Marland Kitchen aspirin EC 81 MG tablet Take 162 mg by mouth daily. Swallow whole.  . cetirizine (ZYRTEC ALLERGY) 10 MG tablet Take 1 tablet (10 mg total) by mouth daily.  Derrill Memo ON 08/07/2020] clonazePAM (KLONOPIN) 0.5 MG tablet Take 1 tablet (0.5 mg total) by mouth 2 (two) times daily.  . Continuous Blood Gluc Sensor (DEXCOM G6 SENSOR) MISC 4 Pieces by Does not apply route once a week.  . Continuous Blood Gluc Transmit (DEXCOM G6 TRANSMITTER) MISC 1 Piece by Does not apply route as directed.  . cyclobenzaprine (FLEXERIL) 10 MG tablet Take 1 tablet (10 mg total) by mouth at bedtime. (Patient taking differently: Take 10 mg by mouth 2 (two) times daily at 8 am and 10 pm. )  . dicyclomine (BENTYL) 10 MG capsule Take 10 mg by mouth 3  (three) times daily as needed.  . docusate sodium (COLACE) 100 MG capsule Take 100 mg by mouth 4 (four) times daily.  Marland Kitchen etonogestrel (NEXPLANON) 68 MG IMPL implant 1 each by Subdermal route once.  . fluconazole (DIFLUCAN) 200 MG tablet Take 400 mg by mouth daily.  . furosemide (LASIX) 40 MG tablet Take 2 tablets by mouth daily.   Marland Kitchen glucose blood (ACCU-CHEK GUIDE) test strip Use as instructed to check blood glucose four times daily  . hyoscyamine (ANASPAZ) 0.125 MG TBDP disintergrating tablet Take by mouth.  . insulin isophane & regular human (HUMULIN 70/30 MIX) (70-30) 100 UNIT/ML KwikPen Inject 90 units with breakfast and 80 units with supper  . Insulin Pen Needle (PEN NEEDLES) 32G X 6 MM MISC 1 each by Does not apply route 3 (three) times daily.  Marland Kitchen liraglutide (VICTOZA) 18 MG/3ML SOPN Inject 1.8 mg into the skin daily.  Marland Kitchen lisinopril (ZESTRIL) 5 MG tablet Take 1 tablet (5 mg total) by mouth daily.  . metFORMIN (GLUCOPHAGE) 1000 MG tablet Take 1 tablet (1,000 mg total) by mouth 2 (two) times daily with a meal.  . methadone (DOLOPHINE) 10 MG/5ML solution Take 15 mg by mouth every morning.   . Multiple Vitamin (MULTIVITAMIN) tablet Take 1 tablet by mouth daily.  Marland Kitchen nystatin (MYCOSTATIN) 100000 UNIT/ML suspension Take 5 mLs (500,000 Units total) by mouth 4 (four) times daily.  . ondansetron (ZOFRAN-ODT) 4 MG disintegrating tablet Take 4 mg by mouth every 8 (eight) hours as needed.  . pantoprazole (PROTONIX) 40 MG tablet Take 40 mg by mouth 2 (two) times daily.  . potassium chloride (KLOR-CON) 10 MEQ tablet Take 2 tablets by mouth daily.   . pregabalin (LYRICA) 75 MG capsule Take 75 mg by mouth 2 (two) times daily.  . psyllium (REGULOID) 0.52 g capsule Take 0.52 g by mouth 4 (four) times daily.  . QUEtiapine (SEROQUEL) 25 MG tablet Take 1 tablet (25 mg total) by mouth at bedtime.  . RESTASIS 0.05 % ophthalmic emulsion Place 1 drop into both eyes as needed.   Marland Kitchen spironolactone (ALDACTONE) 100 MG  tablet Take 1 tablet by mouth daily.  . sucralfate (CARAFATE) 1 g tablet Take 1 g by mouth 4 (four) times daily.  Marland Kitchen venlafaxine XR (EFFEXOR-XR) 150 MG 24 hr capsule Take total of 225 mg daily (150 mg + 75 mg )  . [  DISCONTINUED] insulin isophane & regular human (HUMULIN 70/30 MIX) (70-30) 100 UNIT/ML KwikPen Inject 90 Units into the skin 2 (two) times daily with a meal.  . albuterol (ACCUNEB) 0.63 MG/3ML nebulizer solution Take by nebulization 4 (four) times daily.  . fluticasone (FLONASE) 50 MCG/ACT nasal spray Place 1 spray into both nostrils daily for 14 days.  . rosuvastatin (CRESTOR) 5 MG tablet Take 1 tablet (5 mg total) by mouth daily.  . [DISCONTINUED] benzonatate (TESSALON) 100 MG capsule Take 1 capsule (100 mg total) by mouth every 8 (eight) hours.   No facility-administered encounter medications on file as of 08/06/2020.    ALLERGIES: Allergies  Allergen Reactions  . Iodine-131 Anaphylaxis, Shortness Of Breath and Swelling  . Ivp Dye [Iodinated Diagnostic Agents] Anaphylaxis    Skin gets very red, unable to walk   . Ketorolac Tromethamine Shortness Of Breath  . Tylenol [Acetaminophen] Other (See Comments)    Due to cirrhosis   . Gabapentin   . Morphine And Related   . Nsaids Other (See Comments)    Flares ulcers  . Suboxone [Buprenorphine Hcl-Naloxone Hcl]   . Vancomycin Swelling    Facial swelling,     VACCINATION STATUS: Immunization History  Administered Date(s) Administered  . Influenza,inj,Quad PF,6+ Mos 07/18/2016  . Moderna SARS-COVID-2 Vaccination 03/07/2020, 04/04/2020  . Pneumococcal Polysaccharide-23 07/18/2016, 08/01/2020    Diabetes She presents for her initial diabetic visit. She has type 2 diabetes mellitus. Onset time: She was diagnosed at approximate age of 73 years. Her disease course has been improving. Hypoglycemia symptoms include nervousness/anxiousness. Pertinent negatives for hypoglycemia include no headaches, pallor or tremors. Associated  symptoms include fatigue, foot paresthesias, polydipsia and polyuria. Pertinent negatives for diabetes include no chest pain, no polyphagia and no weight loss. There are no hypoglycemic complications. Symptoms are improving. Diabetic complications include peripheral neuropathy. Risk factors for coronary artery disease include diabetes mellitus, obesity, sedentary lifestyle and dyslipidemia. Current diabetic treatment includes insulin injections and oral agent (monotherapy). She is compliant with treatment most of the time. Her weight is fluctuating dramatically (r/t fluid retention). She is following a low salt and generally unhealthy diet. When asked about meal planning, she reported none. She has had a previous visit with a dietitian. She participates in exercise intermittently. Her home blood glucose trend is decreasing steadily. Her breakfast blood glucose range is generally 130-140 mg/dl. Her lunch blood glucose range is generally 140-180 mg/dl. Her dinner blood glucose range is generally >200 mg/dl. Her bedtime blood glucose range is generally 140-180 mg/dl. (She presents today with her CGM showing greatly improved glycemic profile overall.  She has had some episodes of hypoglycemia, usually overnight, requiring her to eat a snack.  She has recently been hospitalized for fluid overload requiring aggressive IV diuresis for 30+ lbs weight gain.  She feels better but also feels that the fluid is coming back.  Her A1C on 07/29/20 was 8.0%.  Her CGM analysis shows TIR 67%, TAR 28%, TBR 5%.) An ACE inhibitor/angiotensin II receptor blocker is being taken. She does not see a podiatrist.Eye exam is not current.  Hypertension This is a new problem. The current episode started more than 1 month ago. The problem is unchanged. The problem is uncontrolled. Associated symptoms include peripheral edema. Pertinent negatives include no chest pain, headaches, palpitations or shortness of breath. There are no associated agents  to hypertension. Risk factors for coronary artery disease include diabetes mellitus, dyslipidemia, obesity and sedentary lifestyle. Past treatments include diuretics. The current treatment provides  no improvement. There are no compliance problems.      Review of systems  Constitutional: + dramatically fluctuating body weight,  current Body mass index is 43.48 kg/m. , no fatigue, no subjective hyperthermia, no subjective hypothermia Eyes: no blurry vision, no xerophthalmia ENT: no sore throat, no nodules palpated in throat, no dysphagia/odynophagia, no hoarseness Cardiovascular: no chest pain, no shortness of breath, no palpitations, mild leg swelling Respiratory: no cough, + intermittent shortness of breath Gastrointestinal: no nausea/vomiting/diarrhea Musculoskeletal: no muscle/joint aches Skin: no rashes, no hyperemia Neurological: no tremors, no numbness, no tingling, no dizziness Psychiatric: no depression, no anxiety  Objective:     BP 123/72 (BP Location: Right Arm, Patient Position: Sitting)   Pulse (!) 132   Wt 294 lb 6.4 oz (133.5 kg)   BMI 43.48 kg/m   Wt Readings from Last 3 Encounters:  08/06/20 294 lb 6.4 oz (133.5 kg)  07/31/20 286 lb 1.6 oz (129.8 kg)  07/22/20 (!) 302 lb 6.4 oz (137.2 kg)    BP Readings from Last 3 Encounters:  08/06/20 123/72  08/01/20 (!) 146/80  07/22/20 133/82    Physical Exam- Limited  Constitutional:  Body mass index is 43.48 kg/m. , not in acute distress, anxious state of mind Eyes:  EOMI, no exophthalmos Neck: Supple Thyroid: No gross goiter Cardiovascular: tachycardic, no murmers, rubs, or gallops, + 1 pitting edema to BLE Respiratory: Adequate breathing efforts, no crackles, rales, rhonchi, or wheezing Musculoskeletal: no gross deformities, strength intact in all four extremities, no gross restriction of joint movements Skin:  no rashes, no hyperemia Neurological: mild tremor with outstretched hands    CMP ( most  recent) CMP     Component Value Date/Time   NA 135 08/01/2020 0704   NA 143 07/23/2020 0842   K 3.9 08/01/2020 0704   CL 94 (L) 08/01/2020 0704   CO2 29 08/01/2020 0704   GLUCOSE 179 (H) 08/01/2020 0704   BUN 16 08/01/2020 0704   BUN 8 07/23/2020 0842   CREATININE 0.70 08/01/2020 0704   CALCIUM 9.8 08/01/2020 0704   PROT 6.2 (L) 07/30/2020 0408   PROT 6.3 07/23/2020 0842   ALBUMIN 2.8 (L) 07/30/2020 0408   ALBUMIN 3.3 (L) 07/23/2020 0842   AST 32 07/30/2020 0408   ALT 30 07/30/2020 0408   ALKPHOS 92 07/30/2020 0408   BILITOT 0.7 07/30/2020 0408   BILITOT 0.5 07/23/2020 0842   GFRNONAA >60 08/01/2020 0704   GFRAA >60 08/01/2020 0704     Diabetic Labs (most recent): Lab Results  Component Value Date   HGBA1C 8.0 (H) 07/29/2020   HGBA1C 8.6 (A) 05/28/2020   HGBA1C 10.4 12/06/2019     Lipid Panel ( most recent) Lipid Panel     Component Value Date/Time   CHOL 210 (H) 07/23/2020 0842   TRIG 197 (H) 07/23/2020 0842   HDL 60 07/23/2020 0842   CHOLHDL 3.5 07/23/2020 0842   LDLCALC 116 (H) 07/23/2020 0842   LABVLDL 34 07/23/2020 0842      Lab Results  Component Value Date   TSH 0.993 07/30/2020   TSH 1.990 07/23/2020   TSH 1.690 08/22/2010   FREET4 0.72 (L) 07/23/2020   FREET4 1.11 08/22/2010      Assessment & Plan:   1. Type 2 diabetes mellitus with hyperglycemia, without long-term current use of insulin ([place])   - Jaime [name] has currently uncontrolled symptomatic type 2 DM since  34 years of age.  She presents today with her CGM showing  greatly improved glycemic profile overall.  She has had some episodes of hypoglycemia, usually overnight, requiring her to eat a snack.  She has recently been hospitalized for fluid overload requiring aggressive IV diuresis for 30+ lbs weight gain.  She feels better but also feels that the fluid is coming back.  Her A1C on 07/29/20 was 8.0%.  Her CGM analysis shows TIR 67%, TAR 28%, TBR 5%.  She still admits to  drinking sweet tea frequently.  - I had a long discussion with her about the progressive nature of diabetes and the pathology behind its complications. -her diabetes is complicated by obesity/sedentary life and she remains at a high risk for more acute and chronic complications which include CAD, CVA, CKD, retinopathy, and neuropathy. These are all discussed in detail with her.  - Nutritional counseling repeated at each appointment due to patients tendency to fall back in to old habits.  - The patient admits there is a room for improvement in their diet and drink choices. -  Suggestion is made for the patient to avoid simple carbohydrates from their diet including Cakes, Sweet Desserts / Pastries, Ice Cream, Soda (diet and regular), Sweet Tea, Candies, Chips, Cookies, Sweet Pastries,  Store Bought Juices, Alcohol in Excess of  1-2 drinks a day, Artificial Sweeteners, Coffee Creamer, and "Sugar-free" Products. This will help patient to have stable blood glucose profile and potentially avoid unintended weight gain.   - I encouraged the patient to switch to  unprocessed or minimally processed complex starch and increased protein intake (animal or plant source), fruits, and vegetables.   - Patient is advised to stick to a routine mealtimes to eat 3 meals  a day and avoid unnecessary snacks ( to snack only to correct hypoglycemia).  - I have approached her with the following individualized plan to manage  her diabetes and patient agrees:   -Due to her recent improvement in glycemic profile and tightening fasting glycemic profile, will decrease dose of Humulin 70/30 to 90 units with breakfast and 80 units with supper.  She is advised to continue Metformin 1000 mg po twice daily with meals, and continue her Victoza 1.8 SQ daily.  -She has greatly benefited from CGM device and is advised to continue using it to monitor glucose at least 4 times daily, before meals and at bedtime and call the clinic if she  gets readings less than 70 or greater than 300 for 3 tests in a row.  -May consider adding low dose glipizide on subsequent visits if glycemic profile still above target.   - Specific targets for  A1c;  LDL, HDL,  and Triglycerides were discussed with the patient.  2) Blood Pressure /Hypertension: Her blood pressure is not controlled to target based on todays reading, however she had not long taken her medications prior to the visit.  She is advised to continue Lasix 80 mg daily, Spironolactone 100 mg po daily, lisinopril 5 mg po daily.  She is encouraged to reach back out to her PCP or GI MD regarding her fluid management.  She is also encouraged to keep a daily log of her weights.  3) Lipids/Hyperlipidemia:   Review of her recent lipid profile from 07/23/20 shows uncontrolled LDL at 116 and elevated triglycerides of 197.  She is not on any statin medications.  I discussed and initiated Crestor 5 mg po daily at bedtime.  Side effects and precautions discussed with her.  4)  Weight/Diet:   Body mass index is 43.48 kg/m.  -  clearly complicating her diabetes care.   she is  a candidate for modest weight loss. I discussed with her the fact that loss of 5 - 10% of her  current body weight will have the most impact on her diabetes management.  Exercise, and detailed carbohydrates information provided  -  detailed on discharge instructions.  5) Chronic Care/Health Maintenance: -she is on ACE and statin medications and is encouraged to initiate and continue to follow up with Ophthalmology, Dentist,  Podiatrist at least yearly or according to recommendations, and advised to   stay away from smoking. I have recommended yearly flu vaccine and pneumonia vaccine at least every 5 years; moderate intensity exercise for up to 150 minutes weekly; and  sleep for at least 7 hours a day.  - she is  advised to maintain close follow up with Hasanaj, Samul Dada, MD for primary care needs, as well as her other providers  for optimal and coordinated care.   - Time spent on this patient care encounter:  35 min, of which > 50% was spent in  counseling and the rest reviewing her blood glucose logs , discussing her hypoglycemia and hyperglycemia episodes, reviewing her current and  previous labs / studies  ( including abstraction from other facilities) and medications  doses and developing a  long term treatment plan and documenting her care.   Please refer to Patient Instructions for Blood Glucose Monitoring and Insulin/Medications Dosing Guide"  in media tab for additional information. Please  also refer to " Patient Self Inventory" in the Media  tab for reviewed elements of pertinent patient history.  [facility] participated in the discussions, expressed understanding, and voiced agreement with the above plans.  All questions were answered to her satisfaction. she is encouraged to contact clinic should she have any questions or concerns prior to her return visit.  Follow up plan: - Return in about 3 months (around 11/06/2020) for Diabetes follow up, Previsit labs, Virtual visit ok, ABI next visit.  Rayetta Pigg, [facility] [address] [place], [place] [address] Phone: [phone] Fax: [phone]   08/06/2020, 10:47 AM  This note was partially dictated with voice recognition software. Similar sounding words can be transcribed inadequately or may not  be corrected upon review.

## 2020-08-06 NOTE — Patient Instructions (Signed)

## 2020-08-12 ENCOUNTER — Inpatient Hospital Stay (HOSPITAL_COMMUNITY)
Admission: EM | Admit: 2020-08-12 | Discharge: 2020-08-17 | DRG: 871 | Disposition: A | Discharge status: Home / Self Care | Payer: Medicaid Other | Attending: Internal Medicine | Admitting: Internal Medicine

## 2020-08-12 ENCOUNTER — Emergency Department (HOSPITAL_COMMUNITY): Payer: Medicaid Other

## 2020-08-12 ENCOUNTER — Encounter (HOSPITAL_COMMUNITY): Payer: Self-pay

## 2020-08-12 ENCOUNTER — Inpatient Hospital Stay (HOSPITAL_COMMUNITY): Payer: Medicaid Other

## 2020-08-12 ENCOUNTER — Other Ambulatory Visit: Payer: Self-pay

## 2020-08-12 DIAGNOSIS — Z881 Allergy status to other antibiotic agents status: Secondary | ICD-10-CM

## 2020-08-12 DIAGNOSIS — E872 Acidosis: Secondary | ICD-10-CM | POA: Diagnosis present

## 2020-08-12 DIAGNOSIS — J9601 Acute respiratory failure with hypoxia: Secondary | ICD-10-CM | POA: Diagnosis present

## 2020-08-12 DIAGNOSIS — A419 Sepsis, unspecified organism: Secondary | ICD-10-CM | POA: Diagnosis present

## 2020-08-12 DIAGNOSIS — J440 Chronic obstructive pulmonary disease with acute lower respiratory infection: Secondary | ICD-10-CM | POA: Diagnosis present

## 2020-08-12 DIAGNOSIS — Z79891 Long term (current) use of opiate analgesic: Secondary | ICD-10-CM

## 2020-08-12 DIAGNOSIS — E871 Hypo-osmolality and hyponatremia: Secondary | ICD-10-CM | POA: Diagnosis not present

## 2020-08-12 DIAGNOSIS — Z7984 Long term (current) use of oral hypoglycemic drugs: Secondary | ICD-10-CM

## 2020-08-12 DIAGNOSIS — G894 Chronic pain syndrome: Secondary | ICD-10-CM | POA: Diagnosis present

## 2020-08-12 DIAGNOSIS — F17290 Nicotine dependence, other tobacco product, uncomplicated: Secondary | ICD-10-CM | POA: Diagnosis present

## 2020-08-12 DIAGNOSIS — K2970 Gastritis, unspecified, without bleeding: Secondary | ICD-10-CM | POA: Diagnosis present

## 2020-08-12 DIAGNOSIS — F1190 Opioid use, unspecified, uncomplicated: Secondary | ICD-10-CM | POA: Diagnosis present

## 2020-08-12 DIAGNOSIS — E785 Hyperlipidemia, unspecified: Secondary | ICD-10-CM | POA: Diagnosis present

## 2020-08-12 DIAGNOSIS — I5032 Chronic diastolic (congestive) heart failure: Secondary | ICD-10-CM | POA: Diagnosis present

## 2020-08-12 DIAGNOSIS — F419 Anxiety disorder, unspecified: Secondary | ICD-10-CM | POA: Diagnosis present

## 2020-08-12 DIAGNOSIS — R7989 Other specified abnormal findings of blood chemistry: Secondary | ICD-10-CM | POA: Diagnosis present

## 2020-08-12 DIAGNOSIS — F32A Depression, unspecified: Secondary | ICD-10-CM | POA: Diagnosis present

## 2020-08-12 DIAGNOSIS — E1165 Type 2 diabetes mellitus with hyperglycemia: Secondary | ICD-10-CM | POA: Diagnosis not present

## 2020-08-12 DIAGNOSIS — J189 Pneumonia, unspecified organism: Secondary | ICD-10-CM | POA: Diagnosis present

## 2020-08-12 DIAGNOSIS — E8770 Fluid overload, unspecified: Secondary | ICD-10-CM | POA: Diagnosis present

## 2020-08-12 DIAGNOSIS — Z20822 Contact with and (suspected) exposure to covid-19: Secondary | ICD-10-CM | POA: Diagnosis present

## 2020-08-12 DIAGNOSIS — F17210 Nicotine dependence, cigarettes, uncomplicated: Secondary | ICD-10-CM | POA: Diagnosis present

## 2020-08-12 DIAGNOSIS — F39 Unspecified mood [affective] disorder: Secondary | ICD-10-CM | POA: Diagnosis present

## 2020-08-12 DIAGNOSIS — E119 Type 2 diabetes mellitus without complications: Secondary | ICD-10-CM | POA: Diagnosis not present

## 2020-08-12 DIAGNOSIS — E8779 Other fluid overload: Secondary | ICD-10-CM | POA: Diagnosis present

## 2020-08-12 DIAGNOSIS — Z91041 Radiographic dye allergy status: Secondary | ICD-10-CM

## 2020-08-12 DIAGNOSIS — F1120 Opioid dependence, uncomplicated: Secondary | ICD-10-CM | POA: Diagnosis present

## 2020-08-12 DIAGNOSIS — Z6841 Body Mass Index (BMI) 40.0 and over, adult: Secondary | ICD-10-CM | POA: Diagnosis not present

## 2020-08-12 DIAGNOSIS — K7460 Unspecified cirrhosis of liver: Secondary | ICD-10-CM | POA: Diagnosis not present

## 2020-08-12 DIAGNOSIS — R059 Cough, unspecified: Secondary | ICD-10-CM

## 2020-08-12 DIAGNOSIS — Z794 Long term (current) use of insulin: Secondary | ICD-10-CM

## 2020-08-12 DIAGNOSIS — K7581 Nonalcoholic steatohepatitis (NASH): Secondary | ICD-10-CM | POA: Diagnosis present

## 2020-08-12 DIAGNOSIS — E1142 Type 2 diabetes mellitus with diabetic polyneuropathy: Secondary | ICD-10-CM | POA: Diagnosis present

## 2020-08-12 DIAGNOSIS — Z83438 Family history of other disorder of lipoprotein metabolism and other lipidemia: Secondary | ICD-10-CM

## 2020-08-12 DIAGNOSIS — R6520 Severe sepsis without septic shock: Secondary | ICD-10-CM | POA: Diagnosis present

## 2020-08-12 DIAGNOSIS — Z9049 Acquired absence of other specified parts of digestive tract: Secondary | ICD-10-CM

## 2020-08-12 DIAGNOSIS — I110 Hypertensive heart disease with heart failure: Secondary | ICD-10-CM | POA: Diagnosis present

## 2020-08-12 DIAGNOSIS — Z885 Allergy status to narcotic agent status: Secondary | ICD-10-CM

## 2020-08-12 DIAGNOSIS — Z8619 Personal history of other infectious and parasitic diseases: Secondary | ICD-10-CM

## 2020-08-12 DIAGNOSIS — IMO0002 Reserved for concepts with insufficient information to code with codable children: Secondary | ICD-10-CM | POA: Diagnosis present

## 2020-08-12 DIAGNOSIS — D696 Thrombocytopenia, unspecified: Secondary | ICD-10-CM | POA: Diagnosis present

## 2020-08-12 DIAGNOSIS — R601 Generalized edema: Secondary | ICD-10-CM

## 2020-08-12 DIAGNOSIS — K7469 Other cirrhosis of liver: Secondary | ICD-10-CM | POA: Diagnosis present

## 2020-08-12 DIAGNOSIS — Z7982 Long term (current) use of aspirin: Secondary | ICD-10-CM

## 2020-08-12 DIAGNOSIS — Z8249 Family history of ischemic heart disease and other diseases of the circulatory system: Secondary | ICD-10-CM

## 2020-08-12 DIAGNOSIS — Z8711 Personal history of peptic ulcer disease: Secondary | ICD-10-CM

## 2020-08-12 DIAGNOSIS — Z79899 Other long term (current) drug therapy: Secondary | ICD-10-CM

## 2020-08-12 DIAGNOSIS — Z886 Allergy status to analgesic agent status: Secondary | ICD-10-CM

## 2020-08-12 LAB — COMPREHENSIVE METABOLIC PANEL
ALT: 44 U/L (ref 0–44)
AST: 48 U/L — ABNORMAL HIGH (ref 15–41)
Albumin: 3.1 g/dL — ABNORMAL LOW (ref 3.5–5.0)
Alkaline Phosphatase: 90 U/L (ref 38–126)
Anion gap: 9 (ref 5–15)
BUN: 15 mg/dL (ref 6–20)
CO2: 23 mmol/L (ref 22–32)
Calcium: 8.7 mg/dL — ABNORMAL LOW (ref 8.9–10.3)
Chloride: 104 mmol/L (ref 98–111)
Creatinine, Ser: 0.56 mg/dL (ref 0.44–1.00)
GFR, Estimated: >60 mL/min (ref >60)
Glucose, Bld: 56 mg/dL — ABNORMAL LOW (ref 70–99)
Potassium: 4.1 mmol/L (ref 3.5–5.1)
Sodium: 136 mmol/L (ref 135–145)
Total Bilirubin: 1.1 mg/dL (ref 0.3–1.2)
Total Protein: 7 g/dL (ref 6.5–8.1)

## 2020-08-12 LAB — CBC WITH DIFFERENTIAL/PLATELET
Abs Immature Granulocytes: 0.04 10*3/uL (ref 0.00–0.07)
Basophils Absolute: 0 10*3/uL (ref 0.0–0.1)
Basophils Relative: 1 %
Eosinophils Absolute: 0.1 10*3/uL (ref 0.0–0.5)
Eosinophils Relative: 2 %
HCT: 35.1 % — ABNORMAL LOW (ref 36.0–46.0)
Hemoglobin: 11.3 g/dL — ABNORMAL LOW (ref 12.0–15.0)
Immature Granulocytes: 1 %
Lymphocytes Relative: 7 %
Lymphs Abs: 0.5 10*3/uL — ABNORMAL LOW (ref 0.7–4.0)
MCH: 29 pg (ref 26.0–34.0)
MCHC: 32.2 g/dL (ref 30.0–36.0)
MCV: 90.2 fL (ref 80.0–100.0)
Monocytes Absolute: 0.5 10*3/uL (ref 0.1–1.0)
Monocytes Relative: 9 %
Neutro Abs: 5.2 10*3/uL (ref 1.7–7.7)
Neutrophils Relative %: 80 %
Platelets: 108 10*3/uL — ABNORMAL LOW (ref 150–400)
RBC: 3.89 MIL/uL (ref 3.87–5.11)
RDW: 16.2 % — ABNORMAL HIGH (ref 11.5–15.5)
WBC: 6.3 10*3/uL (ref 4.0–10.5)
nRBC: 0 % (ref 0.0–0.2)

## 2020-08-12 LAB — FERRITIN: Ferritin: 63 ng/mL (ref 11–307)

## 2020-08-12 LAB — LACTATE DEHYDROGENASE: LDH: 209 U/L — ABNORMAL HIGH (ref 98–192)

## 2020-08-12 LAB — URINALYSIS, ROUTINE W REFLEX MICROSCOPIC
Bilirubin Urine: NEGATIVE
Glucose, UA: NEGATIVE mg/dL
Hgb urine dipstick: NEGATIVE
Ketones, ur: NEGATIVE mg/dL
Leukocytes,Ua: NEGATIVE
Nitrite: NEGATIVE
Protein, ur: NEGATIVE mg/dL
Specific Gravity, Urine: 1.018 (ref 1.005–1.030)
pH: 6 (ref 5.0–8.0)

## 2020-08-12 LAB — RESPIRATORY PANEL BY RT PCR (FLU A&B, COVID)
Influenza A by PCR: NEGATIVE
Influenza B by PCR: NEGATIVE
SARS Coronavirus 2 by RT PCR: NEGATIVE

## 2020-08-12 LAB — TRIGLYCERIDES: Triglycerides: 86 mg/dL (ref <150)

## 2020-08-12 LAB — LACTIC ACID, PLASMA
Lactic Acid, Venous: 3.1 mmol/L (ref 0.5–1.9)
Lactic Acid, Venous: 2.4 mmol/L (ref 0.5–1.9)

## 2020-08-12 LAB — D-DIMER, QUANTITATIVE: D-Dimer, Quant: 1.16 ug/mL-FEU — ABNORMAL HIGH (ref 0.00–0.50)

## 2020-08-12 LAB — CBG MONITORING, ED: Glucose-Capillary: 34 mg/dL — CL (ref 70–99)

## 2020-08-12 LAB — PROCALCITONIN: Procalcitonin: 0.14 ng/mL

## 2020-08-12 LAB — FIBRINOGEN: Fibrinogen: 428 mg/dL (ref 210–475)

## 2020-08-12 LAB — C-REACTIVE PROTEIN: CRP: 3.6 mg/dL — ABNORMAL HIGH (ref <1.0)

## 2020-08-12 MED ORDER — PROPRANOLOL HCL 20 MG PO TABS
10.0000 mg | ORAL_TABLET | Freq: Three times a day (TID) | ORAL | Status: DC
  Administered 2020-08-13 – 2020-08-17 (×12): 10 mg via ORAL
  Filled 2020-08-12 (×13): qty 1

## 2020-08-12 MED ORDER — SODIUM CHLORIDE 0.9 % IV SOLN
2.0000 g | Freq: Once | INTRAVENOUS | Status: AC
  Administered 2020-08-12: 2 g via INTRAVENOUS
  Filled 2020-08-12: qty 2

## 2020-08-12 MED ORDER — SPIRONOLACTONE 100 MG PO TABS
200.0000 mg | ORAL_TABLET | Freq: Every day | ORAL | Status: DC
  Administered 2020-08-13 – 2020-08-17 (×5): 200 mg via ORAL
  Filled 2020-08-12 (×6): qty 2

## 2020-08-12 MED ORDER — METRONIDAZOLE IN NACL 5-0.79 MG/ML-% IV SOLN
500.0000 mg | Freq: Once | INTRAVENOUS | Status: AC
  Administered 2020-08-12: 500 mg via INTRAVENOUS
  Filled 2020-08-12: qty 100

## 2020-08-12 MED ORDER — METRONIDAZOLE IN NACL 5-0.79 MG/ML-% IV SOLN
500.0000 mg | Freq: Three times a day (TID) | INTRAVENOUS | Status: DC
  Administered 2020-08-12 – 2020-08-14 (×6): 500 mg via INTRAVENOUS
  Filled 2020-08-12 (×6): qty 100

## 2020-08-12 MED ORDER — ACETAMINOPHEN 325 MG PO TABS: 650.0000 mg | ORAL_TABLET | Freq: Four times a day (QID) | ORAL | Status: DC | PRN

## 2020-08-12 MED ORDER — SODIUM CHLORIDE 0.9 % IV SOLN
2.0000 g | Freq: Three times a day (TID) | INTRAVENOUS | Status: DC
  Administered 2020-08-12 – 2020-08-17 (×15): 2 g via INTRAVENOUS
  Filled 2020-08-12 (×15): qty 2

## 2020-08-12 MED ORDER — ROSUVASTATIN CALCIUM 10 MG PO TABS
5.0000 mg | ORAL_TABLET | Freq: Every day | ORAL | Status: DC
  Administered 2020-08-13 – 2020-08-17 (×5): 5 mg via ORAL
  Filled 2020-08-12 (×6): qty 1

## 2020-08-12 MED ORDER — DOCUSATE SODIUM 100 MG PO CAPS
100.0000 mg | ORAL_CAPSULE | Freq: Four times a day (QID) | ORAL | Status: DC
  Administered 2020-08-12 – 2020-08-16 (×13): 100 mg via ORAL
  Filled 2020-08-12 (×14): qty 1

## 2020-08-12 MED ORDER — SUCRALFATE 1 G PO TABS
1.0000 g | ORAL_TABLET | Freq: Three times a day (TID) | ORAL | Status: DC
  Administered 2020-08-12 – 2020-08-17 (×19): 1 g via ORAL
  Filled 2020-08-12 (×19): qty 1

## 2020-08-12 MED ORDER — LISINOPRIL 5 MG PO TABS
5.0000 mg | ORAL_TABLET | Freq: Every day | ORAL | Status: DC
  Administered 2020-08-14 – 2020-08-17 (×4): 5 mg via ORAL
  Filled 2020-08-12 (×6): qty 1

## 2020-08-12 MED ORDER — ASPIRIN EC 81 MG PO TBEC
162.0000 mg | DELAYED_RELEASE_TABLET | Freq: Every day | ORAL | Status: DC
  Administered 2020-08-13 – 2020-08-17 (×5): 162 mg via ORAL
  Filled 2020-08-12 (×5): qty 2

## 2020-08-12 MED ORDER — LACTATED RINGERS IV BOLUS (SEPSIS)
1000.0000 mL | Freq: Once | INTRAVENOUS | Status: AC
  Administered 2020-08-12: 1000 mL via INTRAVENOUS

## 2020-08-12 MED ORDER — ONDANSETRON HCL 4 MG/2ML IJ SOLN: 4.0000 mg | Freq: Four times a day (QID) | INTRAMUSCULAR | Status: DC | PRN

## 2020-08-12 MED ORDER — PANTOPRAZOLE SODIUM 40 MG PO TBEC
40.0000 mg | DELAYED_RELEASE_TABLET | Freq: Two times a day (BID) | ORAL | Status: DC
  Administered 2020-08-12 – 2020-08-17 (×10): 40 mg via ORAL
  Filled 2020-08-12 (×10): qty 1

## 2020-08-12 MED ORDER — ACETAMINOPHEN 650 MG RE SUPP: 650.0000 mg | Freq: Four times a day (QID) | RECTAL | Status: DC | PRN

## 2020-08-12 MED ORDER — DICYCLOMINE HCL 10 MG PO CAPS: 10.0000 mg | ORAL_CAPSULE | Freq: Three times a day (TID) | ORAL | Status: DC | PRN

## 2020-08-12 MED ORDER — QUETIAPINE FUMARATE 25 MG PO TABS
25.0000 mg | ORAL_TABLET | Freq: Every day | ORAL | Status: DC
  Administered 2020-08-12 – 2020-08-16 (×5): 25 mg via ORAL
  Filled 2020-08-12 (×5): qty 1

## 2020-08-12 MED ORDER — DEXTROSE 50 % IV SOLN
1.0000 | Freq: Once | INTRAVENOUS | Status: AC
  Administered 2020-08-12: 50 mL via INTRAVENOUS
  Filled 2020-08-12: qty 50

## 2020-08-12 MED ORDER — INSULIN ASPART 100 UNIT/ML ~~LOC~~ SOLN
0.0000 [IU] | Freq: Three times a day (TID) | SUBCUTANEOUS | Status: DC
  Administered 2020-08-13: 11 [IU] via SUBCUTANEOUS
  Administered 2020-08-13: 5 [IU] via SUBCUTANEOUS
  Administered 2020-08-13: 4 [IU] via SUBCUTANEOUS
  Administered 2020-08-14 (×2): 11 [IU] via SUBCUTANEOUS
  Administered 2020-08-14: 3 [IU] via SUBCUTANEOUS
  Administered 2020-08-15: 5 [IU] via SUBCUTANEOUS
  Administered 2020-08-15: 3 [IU] via SUBCUTANEOUS
  Administered 2020-08-15: 11 [IU] via SUBCUTANEOUS
  Administered 2020-08-16: 8 [IU] via SUBCUTANEOUS
  Administered 2020-08-16: 11 [IU] via SUBCUTANEOUS
  Administered 2020-08-16 – 2020-08-17 (×2): 8 [IU] via SUBCUTANEOUS
  Administered 2020-08-17: 3 [IU] via SUBCUTANEOUS
  Filled 2020-08-12 (×2): qty 1

## 2020-08-12 MED ORDER — TECHNETIUM TO 99M ALBUMIN AGGREGATED
4.4000 | Freq: Once | INTRAVENOUS | Status: AC | PRN
  Administered 2020-08-12: 4.4 via INTRAVENOUS

## 2020-08-12 MED ORDER — PSYLLIUM 95 % PO PACK
1.0000 | PACK | Freq: Two times a day (BID) | ORAL | Status: DC
  Administered 2020-08-13 – 2020-08-16 (×7): 1 via ORAL
  Filled 2020-08-12 (×12): qty 1

## 2020-08-12 MED ORDER — ONDANSETRON HCL 4 MG PO TABS: 4.0000 mg | ORAL_TABLET | Freq: Four times a day (QID) | ORAL | Status: DC | PRN

## 2020-08-12 MED ORDER — IPRATROPIUM-ALBUTEROL 0.5-2.5 (3) MG/3ML IN SOLN: 3.0000 mL | Freq: Four times a day (QID) | RESPIRATORY_TRACT | Status: DC | PRN

## 2020-08-12 MED ORDER — OXYCODONE HCL 5 MG PO TABS
5.0000 mg | ORAL_TABLET | Freq: Four times a day (QID) | ORAL | Status: DC | PRN
  Administered 2020-08-12 – 2020-08-13 (×3): 5 mg via ORAL
  Filled 2020-08-12 (×4): qty 1

## 2020-08-12 MED ORDER — ONDANSETRON HCL 4 MG/2ML IJ SOLN
4.0000 mg | Freq: Once | INTRAMUSCULAR | Status: AC
  Administered 2020-08-12: 4 mg via INTRAVENOUS
  Filled 2020-08-12: qty 2

## 2020-08-12 MED ORDER — ENOXAPARIN SODIUM 80 MG/0.8ML ~~LOC~~ SOLN: 70.0000 mg | SUBCUTANEOUS | Status: DC

## 2020-08-12 MED ORDER — ONDANSETRON 4 MG PO TBDP
4.0000 mg | ORAL_TABLET | Freq: Three times a day (TID) | ORAL | Status: DC | PRN
  Administered 2020-08-15: 4 mg via ORAL
  Filled 2020-08-12: qty 1

## 2020-08-12 MED ORDER — FUROSEMIDE 10 MG/ML IJ SOLN
40.0000 mg | Freq: Two times a day (BID) | INTRAMUSCULAR | Status: DC
  Administered 2020-08-12 – 2020-08-16 (×8): 40 mg via INTRAVENOUS
  Filled 2020-08-12 (×8): qty 4

## 2020-08-12 MED ORDER — SENNA 8.6 MG PO TABS
1.0000 | ORAL_TABLET | Freq: Two times a day (BID) | ORAL | Status: DC
  Administered 2020-08-13 – 2020-08-16 (×7): 8.6 mg via ORAL
  Filled 2020-08-12 (×12): qty 1

## 2020-08-12 MED ORDER — DOCUSATE SODIUM 100 MG PO CAPS: 100.0000 mg | ORAL_CAPSULE | Freq: Two times a day (BID) | ORAL | Status: DC

## 2020-08-12 MED ORDER — LACTATED RINGERS IV BOLUS (SEPSIS): 200.0000 mL | Freq: Once | INTRAVENOUS | Status: DC

## 2020-08-12 MED ORDER — PSYLLIUM 0.52 G PO CAPS: 0.5200 g | ORAL_CAPSULE | Freq: Four times a day (QID) | ORAL | Status: DC

## 2020-08-12 MED ORDER — LINEZOLID 600 MG/300ML IV SOLN
600.0000 mg | Freq: Two times a day (BID) | INTRAVENOUS | Status: DC
  Administered 2020-08-12 – 2020-08-13 (×2): 600 mg via INTRAVENOUS
  Filled 2020-08-12 (×6): qty 300

## 2020-08-12 NOTE — ED Notes (Addendum)
Dr. Dwyane Dee made aware of patient's repeat Lactic Acid at this time.  No new orders obtained at this time.

## 2020-08-12 NOTE — ED Notes (Signed)
This RN attempted IV placement x2 without success.

## 2020-08-12 NOTE — ED Notes (Signed)
Date and time results received: 08/12/20 11:42 AM  Test: Lactic Acid Critical Value: 3.1  Name of Provider Notified: Dr. Karle Starch

## 2020-08-12 NOTE — ED Triage Notes (Addendum)
Pt reports increase fluid retention from 278 lbs to 318 lbs in 2 weeks. Pt reports SOB, cp, Abdominal pain. Reports taking fluid medication but not urinating well. Pt unaware she had a fever

## 2020-08-12 NOTE — ED Notes (Signed)
Date and time results received: 08/12/20 1354   Test: Lactic Acid Critical Value: 2.4  Name of Provider Notified: Dwyane Dee  Orders Received? Or Actions Taken?: No new orders given.

## 2020-08-12 NOTE — H&P (Signed)
History and Physical    Jaime [name] UEA:[id number] DOB: 06-Mar-1986 DOA: 08/12/2020  PCP: Neale Burly, MD   Patient coming from:  Shortness of breath and weight gain.  I have personally briefly reviewed patient's old medical records in [facility].  Chief Complaint:   HPI: Jaime [name] is a 34 y.o. female with medical history significant of cirrhosis, IV drug abuse, hepatitis C s/p treatment, diabetes mellitus, COPD, Morbid obesity , history of contrast allergy  presents in the emergency department with worsening shortness of breath. She reports gaining more than 30 pounds in last 2 weeks since her discharge from hospital. She was recently hospitalized for CHF exacerbation. She reports taking her medications regularly but was not able to urinate properly.  She checked her O2 saturation which was low and her family noticed her warm that she call EMS.  She denies any sick contacts, recent travel, cough, chest pain, dizziness, palpitations, nausea vomiting diarrhea.  ED Course: In the ED She was febrile with a temp of 102.4, tachycardic 132, tachypneic 36, blood pressure 123/50. She was hypoxic requiring 2 L supplemental oxygen sats 91%. Labs: include sodium 136, potassium 4.1, chloride 104, bicarb 23, BUN 15, creatinine 0.56 ,glucose 56, calcium 8.9 alkaline phosphatase 90,  Albumin 3.1, AST 48, ALT 44 total protein 7.0 total bilirubin 1.1, LDH 209, triglyceride 86, lactic acid 3.1, procalcitonin 0.14, CBC shows WBC 6.3 hemoglobin 11.3 hematocrit 35.1 platelet 108 RSV negative influenza negative Covid negative UA unremarkable blood and urine cultures were sent D-dimer 1.18 VQ scan negative.  Review of Systems:  Review of Systems  Constitutional: Positive for chills and fever.  HENT: Negative.   Eyes: Negative.   Respiratory: Positive for cough and shortness of breath.   Cardiovascular: Positive for orthopnea, leg swelling and PND.  Gastrointestinal: Negative.   Genitourinary:  Negative.   Musculoskeletal: Negative.   Skin: Negative.   Neurological: Negative.   Endo/Heme/Allergies: Negative.   Psychiatric/Behavioral: Negative.     Past Medical History:  Diagnosis Date  . Anxiety   . Asthma   . Chronic abdominal pain   . Chronic back pain   . Cirrhosis ([place])   . Cirrhosis ([place])   . COPD (chronic obstructive pulmonary disease) ([place])   . Depression   . Diabetes mellitus without complication ([place])   . Diabetes mellitus, type II ([place])   . Hepatitis C   . Insomnia   . Long-term current use of methadone for opiate dependence ([place])   . Lupus ([place])   . Migraine headache   . Neuropathy   . Nocturnal seizures ([place])   . Peptic ulcer   . Spleen enlarged     Past Surgical History:  Procedure Laterality Date  . CHOLECYSTECTOMY       reports that she has been smoking e-cigarettes. She has been smoking about 0.00 packs per day for the past 5.00 years. She has never used smokeless tobacco. She reports that she does not drink alcohol and does not use drugs.  Allergies  Allergen Reactions  . Iodine-131 Anaphylaxis, Shortness Of Breath and Swelling  . Ivp Dye [Iodinated Diagnostic Agents] Anaphylaxis    Skin gets very red, unable to walk   . Ketorolac Tromethamine Shortness Of Breath  . Tylenol [Acetaminophen] Other (See Comments)    Due to cirrhosis   . Gabapentin   . Morphine And Related     Pt says she is not allergic to Morphine 08/12/20  . Nsaids Other (See Comments)  Flares ulcers  . Suboxone [Buprenorphine Hcl-Naloxone Hcl]   . Vancomycin Swelling    Facial swelling,     Family History  Problem Relation Age of Onset  . Hypertension Mother   . Hyperlipidemia Mother   . Hyperlipidemia Maternal Grandfather      Family history reviewed and not pertinent   Prior to Admission medications   Medication Sig Start Date End Date Taking? Authorizing Provider  albuterol (ACCUNEB) 0.63 MG/3ML nebulizer solution Take by nebulization 4 (four) times  daily. 07/27/20  Yes [provider]  albuterol (PROVENTIL) (2.5 MG/3ML) 0.083% nebulizer solution Take 2.5 mg by nebulization every 6 (six) hours as needed for wheezing or shortness of breath.   Yes [provider]  aspirin EC 81 MG tablet Take 162 mg by mouth daily. Swallow whole.   Yes [provider]  cetirizine (ZYRTEC ALLERGY) 10 MG tablet Take 1 tablet (10 mg total) by mouth daily. 07/09/20  Yes Avegno, Darrelyn Hillock, FNP  clonazePAM (KLONOPIN) 0.5 MG tablet Take 1 tablet (0.5 mg total) by mouth 2 (two) times daily. 08/07/20 09/06/20 Yes Hisada, Elie Goody, MD  cyclobenzaprine (FLEXERIL) 10 MG tablet Take 1 tablet (10 mg total) by mouth at bedtime. Patient taking differently: Take 10 mg by mouth 2 (two) times daily at 8 am and 10 pm.  06/13/20  Yes Wurst, [place], PA-C  dicyclomine (BENTYL) 10 MG capsule Take 10 mg by mouth 3 (three) times daily as needed for spasms.  07/25/20  Yes [provider]  docusate sodium (COLACE) 100 MG capsule Take 100 mg by mouth 4 (four) times daily.   Yes [provider]  etonogestrel (NEXPLANON) 68 MG IMPL implant 1 each by Subdermal route once.   Yes [provider]  fluconazole (DIFLUCAN) 200 MG tablet Take 400 mg by mouth daily. 06/18/20  Yes [provider]  fluticasone (FLONASE) 50 MCG/ACT nasal spray Place 1 spray into both nostrils daily for 14 days. 07/09/20 08/12/20 Yes Avegno, Darrelyn Hillock, FNP  furosemide (LASIX) 40 MG tablet Take 2 tablets by mouth daily.  05/22/20  Yes [provider]  hyoscyamine (ANASPAZ) 0.125 MG TBDP disintergrating tablet Place 0.125 mg under the tongue 3 (three) times daily as needed for bladder spasms or cramping (Patient Preference).  08/05/20  Yes [provider]  insulin isophane & regular human (HUMULIN 70/30 MIX) (70-30) 100 UNIT/ML KwikPen Inject 90 units with breakfast and 80 units with supper 08/06/20  Yes Reardon, Whitney J, NP  liraglutide (VICTOZA) 18 MG/3ML  SOPN Inject 1.8 mg into the skin daily. 07/22/20  Yes Reardon, Juanetta Beets, NP  lisinopril (ZESTRIL) 5 MG tablet Take 1 tablet (5 mg total) by mouth daily. 06/06/20  Yes Brita Romp, NP  metFORMIN (GLUCOPHAGE) 1000 MG tablet Take 1 tablet (1,000 mg total) by mouth 2 (two) times daily with a meal. 07/22/20  Yes Brita Romp, NP  methadone (DOLOPHINE) 10 MG/5ML solution Take 25 mg by mouth every morning.    Yes [provider]  Multiple Vitamin (MULTIVITAMIN) tablet Take 1 tablet by mouth daily.   Yes [provider]  nystatin (MYCOSTATIN) 100000 UNIT/ML suspension Take 5 mLs (500,000 Units total) by mouth 4 (four) times daily. 06/13/20  Yes Wurst, [place], PA-C  ondansetron (ZOFRAN-ODT) 4 MG disintegrating tablet Take 4 mg by mouth every 8 (eight) hours as needed for nausea.  06/14/20  Yes [provider]  pantoprazole (PROTONIX) 40 MG tablet Take 40 mg by mouth 2 (two) times daily. 07/05/20  Yes [provider]  potassium chloride (KLOR-CON) 10 MEQ tablet Take 2 tablets by mouth daily.  04/24/20  Yes [provider]  pregabalin (LYRICA) 75 MG capsule Take 75 mg by mouth in the morning, at noon, in the evening, and at bedtime.    Yes [provider]  propranolol (INDERAL) 10 MG tablet Take 10 mg by mouth 3 (three) times daily. 08/08/20  Yes [provider]  psyllium (REGULOID) 0.52 g capsule Take 0.52 g by mouth 4 (four) times daily.   Yes [provider]  QUEtiapine (SEROQUEL) 25 MG tablet Take 1 tablet (25 mg total) by mouth at bedtime. 07/19/20  Yes Hisada, Elie Goody, MD  RESTASIS 0.05 % ophthalmic emulsion Place 1 drop into both eyes as needed (dry eye).  04/22/19  Yes [provider]  rosuvastatin (CRESTOR) 5 MG tablet Take 1 tablet (5 mg total) by mouth daily. 08/06/20  Yes Brita Romp, NP  spironolactone (ALDACTONE) 100 MG tablet Take 200 mg by mouth daily.  05/22/20  Yes [provider]  sucralfate  (CARAFATE) 1 g tablet Take 1 g by mouth 4 (four) times daily. 07/02/20  Yes [provider]  venlafaxine XR (EFFEXOR-XR) 150 MG 24 hr capsule Take total of 225 mg daily (150 mg + 75 mg ) Patient taking differently: Take 150 mg by mouth daily. To take along with 72m for a total of 225 mg daily 07/19/20  Yes Hisada, RElie Goody MD  venlafaxine XR (EFFEXOR-XR) 75 MG 24 hr capsule Take 75 mg by mouth daily. To take along with 1558mfor a total of 225 mg daily   Yes [provider]  Accu-Chek Softclix Lancets lancets 4 (four) times daily. 07/07/20   [provider]  Continuous Blood Gluc Sensor (DEXCOM G6 SENSOR) MISC 4 Pieces by Does not apply route once a week. 07/22/20   ReBrita RompNP  Continuous Blood Gluc Transmit (DEXCOM G6 TRANSMITTER) MISC 1 Piece by Does not apply route as directed. 07/22/20   ReBrita RompNP  glucose blood (ACCU-CHEK GUIDE) test strip Use as instructed to check blood glucose four times daily 06/07/20   NiCassandria AngerMD  Insulin Pen Needle (PEN NEEDLES) 32G X 6 MM MISC 1 each by Does not apply route 3 (three) times daily. 07/23/20   NiCassandria AngerMD    Physical Exam: Vitals:   08/12/20 1004 08/12/20 1005 08/12/20 1331  BP:  (!) 123/58 (!) 110/46  Pulse:  (!) 132 (!) 116  Resp:  (!) 36 (!) 28  Temp:  (!) 102.4 F (39.1 C) 99.2 F (37.3 C)  TempSrc:  Oral Oral  SpO2: 91% 91% 92%  Weight:  (!) 144.2 kg   Height:  5' 9"  (1.753 m)     Constitutional: NAD, calm, comfortable Vitals:   08/12/20 1004 08/12/20 1005 08/12/20 1331  BP:  (!) 123/58 (!) 110/46  Pulse:  (!) 132 (!) 116  Resp:  (!) 36 (!) 28  Temp:  (!) 102.4 F (39.1 C) 99.2 F (37.3 C)  TempSrc:  Oral Oral  SpO2: 91% 91% 92%  Weight:  (!) 144.2 kg   Height:  5' 9"  (1.753 m)    Eyes: PERRL, lids and conjunctivae normal ENMT: Mucous membranes are moist. Posterior pharynx clear of any exudate or lesions.Normal dentition.  Neck: normal, supple, no masses, no  thyromegaly Respiratory: Bibasilar crackles bilaterally, no wheezing, Normal respiratory effort. No accessory muscle use.  Cardiovascular: Regular rate and rhythm, no  murmurs / rubs / gallops. No extremity edema. 2+ pedal pulses. No carotid bruits.  Abdomen: no tenderness, no masses palpated. No hepatosplenomegaly. Bowel sounds positive.  Musculoskeletal: no clubbing / cyanosis. No joint deformity upper and lower extremities. Good ROM, no contractures. Normal muscle tone.  Skin: no rashes, lesions, ulcers. No induration Neurologic: CN 2-12 grossly intact. Sensation intact, DTR normal. Strength 5/5 in all 4.  Psychiatric: Normal judgment and insight. Alert and oriented x 3. Normal mood.   Labs on Admission: I have personally reviewed following labs and imaging studies  CBC: Recent Labs  Lab 08/12/20 1023  WBC 6.3  NEUTROABS 5.2  HGB 11.3*  HCT 35.1*  MCV 90.2  PLT 101*   Basic Metabolic Panel: Recent Labs  Lab 08/12/20 1023  NA 136  K 4.1  CL 104  CO2 23  GLUCOSE 56*  BUN 15  CREATININE 0.56  CALCIUM 8.7*   GFR: Estimated Creatinine Clearance: 149.5 mL/min (by C-G formula based on SCr of 0.56 mg/dL). Liver Function Tests: Recent Labs  Lab 08/12/20 1023  AST 48*  ALT 44  ALKPHOS 90  BILITOT 1.1  PROT 7.0  ALBUMIN 3.1*   No results for input(s): LIPASE, AMYLASE in the last 168 hours. No results for input(s): AMMONIA in the last 168 hours. Coagulation Profile: No results for input(s): INR, PROTIME in the last 168 hours. Cardiac Enzymes: No results for input(s): CKTOTAL, CKMB, CKMBINDEX, TROPONINI in the last 168 hours. BNP (last 3 results) No results for input(s): PROBNP in the last 8760 hours. HbA1C: No results for input(s): HGBA1C in the last 72 hours. CBG: Recent Labs  Lab 08/12/20 1149  GLUCAP 34*   Lipid Profile: Recent Labs    08/12/20 1023  TRIG 86   Thyroid Function Tests: No results for input(s): TSH, T4TOTAL, FREET4, T3FREE, THYROIDAB in  the last 72 hours. Anemia Panel: Recent Labs    08/12/20 1023  FERRITIN 63   Urine analysis:    Component Value Date/Time   COLORURINE YELLOW 08/12/2020 1240   APPEARANCEUR CLOUDY (A) 08/12/2020 1240   LABSPEC 1.018 08/12/2020 1240   PHURINE 6.0 08/12/2020 1240   GLUCOSEU NEGATIVE 08/12/2020 1240   HGBUR NEGATIVE 08/12/2020 [place] 08/12/2020 1240   BILIRUBINUR negative 04/10/2020 [place] 08/12/2020 1240   [place] 08/12/2020 1240   UROBILINOGEN 1.0 04/10/2020 1822   UROBILINOGEN 1.0 10/07/2013 0644   NITRITE NEGATIVE 08/12/2020 1240   LEUKOCYTESUR NEGATIVE 08/12/2020 1240    Radiological Exams on Admission: DG Chest Port 1 View  Result Date: 08/12/2020 CLINICAL DATA:  Shortness of breath EXAM: PORTABLE CHEST 1 VIEW COMPARISON:  July 29, 2020 chest radiograph and chest CT FINDINGS: There is bibasilar atelectasis. No edema or airspace opacity. Heart is mildly enlarged with pulmonary vascularity normal. No adenopathy. No bone lesions. IMPRESSION: Mild bibasilar atelectasis. Lungs otherwise clear. Mild cardiac enlargement. No adenopathy. Electronically Signed   By: Lowella Grip III M.D.   On: 08/12/2020 10:54    EKG: Independently reviewed.  Sinus tachycardia , Normal sinus rhythm  Assessment/Plan Active Problems:   Severe sepsis (HCC)  Severe sepsis,  unknown cause:  Patient presents with fever 102.4 , tachycardia, tachypnea,  Hypoxia,  lactic acid 3.1 Chest x-ray bibasilar atelectasis, UA:  Unremarkable. Source undetermined at this time. Admit to stepdown, Lactic acid improved to 2.3 after fluid bolus. Patient received  Linezolid, cefepime,  metronidazole in the ED.Marland Kitchen Continue same antibiotics for now and deescalate once culture comes  back. Follow-up urine cultures and blood cultures. Continue Tylenol as needed for fever and pain.  Generalized Edema : She reports gaining 30 pounds in last 2 weeks, She takes  Lasix 40 mg twice daily. She is found to have bibasilar crackles with pedal edema 2+. We will continue Lasix 40 mg IV twice daily for diuresis. Daily intake and output charting, daily weight. Recent Echo 2 weeks ago shows EF 55 to 60%. No regional wall motion abnormality. Continue spironolactone.  Diabetes mellitus: Hold p.o. diabetic medications, Regular insulin sliding scale.   Obtain hemoglobin A1c.  Hypertension :  Continue propranolol and lisinopril.  Elevated D-dimer: She is found to have elevated D-dimer,  CTA chest not completed due to the contrast allergy. VQ scan low probability for PE  Thrombocytopenia: Patient has chronic thrombocytopenia could be due to cirrhosis of liver.  Morbid obesity: Patient needs dietitian consult and counseling.  Chronic pain syndrome: Patient follows up with methadone clinic in [place] She takes methadone will verify and resume methadone.  IV drug abuse : We will obtain urine drug screen, She denies using any IV drug recently.  History of hepatitis C: She reports completing treatment for hepatitis C long time ago.  Acute hypoxic respiratory failure secondary to fluid overload and COPD : She does not use oxygen at baseline. She is requiring 2 L saturating 94%. wean oxygen to room air as tolerated. Continue inhalers, nebulization as needed.  DVT prophylaxis:  Lovenox  Code Status: Full code. Family Communication:  N one was at bed side. Disposition Plan:   Status is: Inpatient  Remains inpatient appropriate because:Inpatient level of care appropriate due to severity of illness   Dispo: The patient is from: Home              Anticipated d/c is to: Home              Anticipated d/c date is: 3 days              Patient currently is not medically stable to d/c.   Consults called:  None Admission status: Inpatient   Shawna Clamp MD [facility]   If 7PM-7AM, please contact  night-coverage [email or web address]   08/12/2020, 2:14 PM

## 2020-08-12 NOTE — ED Notes (Signed)
Pt transported to Nuclear Med by Nuclear Med team at this time.

## 2020-08-12 NOTE — ED Notes (Addendum)
This RN to bedside, introduced self to patient and explained role in plan of care. While at bedside, pt reports "I think my blood sugar is dropping. I have the Dexcom and it's saying that I'm 54." This RN obtained a blood sugar check and noted that blood sugar is 34. Pt is alert and oriented, provided with juice and crackers at this time.   Bed is locked in the lowest position, side rails x2, call bell within reach and all personal items within reach at this time. All questions and concerns voiced addressed at this time. Pt placed on continuous cardiac monitor with vital signs cycling q30 minutes. Will continue to monitor at this time.

## 2020-08-12 NOTE — Progress Notes (Addendum)
Pharmacy Antibiotic Note  Jaime [name] is a 34 y.o. female admitted on 08/12/2020 with infection of unknown source.  Pharmacy has been consulted for cefepime dosing.    Plan:  Start cefepime IV q8h    Pharmacy will continue to monitor renal function, cultures and patient progress.  Height: 5' 9"  (175.3 cm) Weight: (!) 144.2 kg (318 lb) IBW/kg (Calculated) : 66.2  Temp (24hrs), Avg:102.4 F (39.1 C), Min:102.4 F (39.1 C), Max:102.4 F (39.1 C)  Recent Labs  Lab 08/12/20 1023  WBC 6.3  CREATININE 0.56  LATICACIDVEN 3.1*    Estimated Creatinine Clearance: 149.5 mL/min (by C-G formula based on SCr of 0.56 mg/dL).    Allergies  Allergen Reactions  . Iodine-131 Anaphylaxis, Shortness Of Breath and Swelling  . Ivp Dye [Iodinated Diagnostic Agents] Anaphylaxis    Skin gets very red, unable to walk   . Ketorolac Tromethamine Shortness Of Breath  . Tylenol [Acetaminophen] Other (See Comments)    Due to cirrhosis   . Gabapentin   . Morphine And Related     Pt says she is not allergic to Morphine 08/12/20  . Nsaids Other (See Comments)    Flares ulcers  . Suboxone [Buprenorphine Hcl-Naloxone Hcl]   . Vancomycin Swelling    Facial swelling,     Antimicrobials this admission: cefepime 10/11 >>  metronidazole 10/11 >>   linezolid 10/11>>   Microbiology results: 10/11 BC x2:   10/11 UCx:   10/11 Resp PCR: SARS CoV-2:                       Flu A/B:        Thank you for allowing pharmacy to be a part of this patient's care.  Despina Pole 08/12/2020 12:39 PM

## 2020-08-12 NOTE — ED Provider Notes (Signed)
[facility] EMERGENCY DEPARTMENT Provider Note  CSN: [id number] Arrival date & time: 08/12/20 2951    History Chief Complaint  Patient presents with  . Fever  . Shortness of Breath    HPI  Nyela Cortinas is a 34 y.o. 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 She was given IV lasix with good diuresis and discharged about 2 weeks ago. She reports she has had increasing fluid retention, weight gain and SOB since then, worse this morning. She reports she is about 30lbs above her discharge weight. She was noted to be febrile on arrival but did not know she had a fever at home. She reports continued abdominal and back pain she attributes to her fluid. She has been nauseated but not vomiting. She has been taking her oral diuretics without much urine output.    Past Medical History:  Diagnosis Date  . Anxiety   . Asthma   . Chronic abdominal pain   . Chronic back pain   . Cirrhosis ([place])   . Cirrhosis ([place])   . COPD (chronic obstructive pulmonary disease) ([place])   . Depression   . Diabetes mellitus without complication ([place])   . Diabetes mellitus, type II ([place])   . Hepatitis C   . Insomnia   . Long-term current use of methadone for opiate dependence ([place])   . Lupus ([place])   . Migraine headache   . Neuropathy   . Nocturnal seizures ([place])   . Peptic ulcer   . Spleen enlarged     Past Surgical History:  Procedure Laterality Date  . CHOLECYSTECTOMY      Family History  Problem Relation Age of Onset  . Hypertension Mother   . Hyperlipidemia Mother   . Hyperlipidemia Maternal Grandfather     Social History   Tobacco Use  . Smoking status: Current Every Day Smoker    Packs/day: 0.00    Years: 5.00    Pack years: 0.00    Types: E-cigarettes  . Smokeless tobacco: Never Used  Vaping Use  . Vaping Use: Every day  . Substances: Nicotine, Flavoring    Substance Use Topics  . Alcohol use: No  . Drug use: No     Home Medications Prior to Admission medications   Medication Sig Start Date End Date Taking? Authorizing Provider  albuterol (ACCUNEB) 0.63 MG/3ML nebulizer solution Take by nebulization 4 (four) times daily. 07/27/20  Yes [provider]  albuterol (PROVENTIL) (2.5 MG/3ML) 0.083% nebulizer solution Take 2.5 mg by nebulization every 6 (six) hours as needed for wheezing or shortness of breath.   Yes [provider]  aspirin EC 81 MG tablet Take 162 mg by mouth daily. Swallow whole.   Yes [provider]  cetirizine (ZYRTEC ALLERGY) 10 MG tablet Take 1 tablet (10 mg total) by mouth daily. 07/09/20  Yes Avegno, Darrelyn Hillock, FNP  clonazePAM (KLONOPIN) 0.5 MG tablet Take 1 tablet (0.5 mg total) by mouth 2 (two) times daily. 08/07/20 09/06/20 Yes Hisada, Elie Goody, MD  cyclobenzaprine (FLEXERIL) 10 MG tablet Take 1 tablet (10 mg total) by mouth at bedtime. Patient taking differently: Take 10 mg by mouth 2 (two) times daily at 8 am and 10 pm.  06/13/20  Yes Wurst, [place], PA-C  dicyclomine (BENTYL) 10 MG capsule Take 10 mg by mouth 3 (three) times daily as needed for spasms.  07/25/20  Yes [provider]  docusate sodium (COLACE) 100 MG capsule Take 100 mg by mouth 4 (four) times daily.   Yes [provider]  etonogestrel (NEXPLANON) 68 MG IMPL implant 1 each by Subdermal route once.   Yes [provider]  fluconazole (DIFLUCAN) 200 MG tablet Take 400 mg by mouth daily. 06/18/20  Yes [provider]  fluticasone (FLONASE) 50 MCG/ACT nasal spray Place 1 spray into both nostrils daily for 14 days. 07/09/20 08/12/20 Yes Avegno, Darrelyn Hillock, FNP  furosemide (LASIX) 40 MG tablet Take 2 tablets by mouth daily.  05/22/20  Yes [provider]  hyoscyamine (ANASPAZ) 0.125 MG TBDP disintergrating tablet Place 0.125 mg under the tongue 3 (three) times daily as needed for bladder spasms or  cramping (Patient Preference).  08/05/20  Yes [provider]  insulin isophane & regular human (HUMULIN 70/30 MIX) (70-30) 100 UNIT/ML KwikPen Inject 90 units with breakfast and 80 units with supper 08/06/20  Yes Reardon, Whitney J, NP  liraglutide (VICTOZA) 18 MG/3ML SOPN Inject 1.8 mg into the skin daily. 07/22/20  Yes Reardon, Juanetta Beets, NP  lisinopril (ZESTRIL) 5 MG tablet Take 1 tablet (5 mg total) by mouth daily. 06/06/20  Yes Brita Romp, NP  metFORMIN (GLUCOPHAGE) 1000 MG tablet Take 1 tablet (1,000 mg total) by mouth 2 (two) times daily with a meal. 07/22/20  Yes Brita Romp, NP  methadone (DOLOPHINE) 10 MG/5ML solution Take 25 mg by mouth every morning.    Yes [provider]  Multiple Vitamin (MULTIVITAMIN) tablet Take 1 tablet by mouth daily.   Yes [provider]  nystatin (MYCOSTATIN) 100000 UNIT/ML suspension Take 5 mLs (500,000 Units total) by mouth 4 (four) times daily. 06/13/20  Yes Wurst, [place], PA-C  ondansetron (ZOFRAN-ODT) 4 MG disintegrating tablet Take 4 mg by mouth every 8 (eight) hours as needed for nausea.  06/14/20  Yes [provider]  pantoprazole (PROTONIX) 40 MG tablet Take 40 mg by mouth 2 (two) times daily. 07/05/20  Yes [provider]  potassium chloride (KLOR-CON) 10 MEQ tablet Take 2 tablets by mouth daily.  04/24/20  Yes [provider]  pregabalin (LYRICA) 75 MG capsule Take 75 mg by mouth in the morning, at noon, in the evening, and at bedtime.    Yes [provider]  propranolol (INDERAL) 10 MG tablet Take 10 mg by mouth 3 (three) times daily. 08/08/20  Yes [provider]  psyllium (REGULOID) 0.52 g capsule Take 0.52 g by mouth 4 (four) times daily.   Yes [provider]  QUEtiapine (SEROQUEL) 25 MG tablet Take 1 tablet (25 mg total) by mouth at bedtime. 07/19/20  Yes Hisada, Elie Goody, MD  RESTASIS 0.05 % ophthalmic emulsion Place 1 drop into both eyes as needed (dry eye).   04/22/19  Yes [provider]  rosuvastatin (CRESTOR) 5 MG tablet Take 1 tablet (5 mg total) by mouth daily. 08/06/20  Yes Brita Romp, NP  spironolactone (ALDACTONE) 100 MG tablet Take 200 mg by mouth daily.  05/22/20  Yes [provider]  sucralfate (CARAFATE) 1 g tablet Take 1 g by mouth 4 (four) times daily. 07/02/20  Yes [provider]  venlafaxine XR (EFFEXOR-XR) 150 MG 24 hr capsule Take total of 225 mg daily (150 mg + 75 mg ) Patient taking differently: Take 150 mg by mouth daily. To take along with 37m for a total of 225 mg daily 07/19/20  Yes Hisada, RElie Goody MD  venlafaxine XR (EFFEXOR-XR) 75 MG  24 hr capsule Take 75 mg by mouth daily. To take along with 123m for a total of 225 mg daily   Yes [provider]  Accu-Chek Softclix Lancets lancets 4 (four) times daily. 07/07/20   [provider]  Continuous Blood Gluc Sensor (DEXCOM G6 SENSOR) MISC 4 Pieces by Does not apply route once a week. 07/22/20   RBrita Romp NP  Continuous Blood Gluc Transmit (DEXCOM G6 TRANSMITTER) MISC 1 Piece by Does not apply route as directed. 07/22/20   RBrita Romp NP  glucose blood (ACCU-CHEK GUIDE) test strip Use as instructed to check blood glucose four times daily 06/07/20   NCassandria Anger MD  Insulin Pen Needle (PEN NEEDLES) 32G X 6 MM MISC 1 each by Does not apply route 3 (three) times daily. 07/23/20   NCassandria Anger MD     Allergies    Iodine-131, Ivp dye [iodinated diagnostic agents], Ketorolac tromethamine, Tylenol [acetaminophen], Gabapentin, Morphine and related, Nsaids, Suboxone [buprenorphine hcl-naloxone hcl], and Vancomycin   Review of Systems   Review of Systems A comprehensive review of systems was completed and negative except as noted in HPI.    Physical Exam BP (!) 110/46 (BP Location: Left Arm)   Pulse (!) 116   Temp (!) 102.4 F (39.1 C) (Oral)   Resp (!) 28   Ht 5' 9"  (1.753 m)   Wt (!) 144.2 kg   SpO2  92%   BMI 46.96 kg/m   Physical Exam Vitals and nursing note reviewed.  Constitutional:      Appearance: Normal appearance.  HENT:     Head: Normocephalic and atraumatic.     Nose: Nose normal.     Mouth/Throat:     Mouth: Mucous membranes are moist.  Eyes:     Extraocular Movements: Extraocular movements intact.     Conjunctiva/sclera: Conjunctivae normal.  Cardiovascular:     Rate and Rhythm: Tachycardia present.  Pulmonary:     Effort: Pulmonary effort is normal. Tachypnea present.     Breath sounds: Examination of the right-lower field reveals rales. Examination of the left-lower field reveals rales. Rales present.  Abdominal:     General: Abdomen is flat.     Palpations: Abdomen is soft.     Tenderness: There is abdominal tenderness (diffuse).     Comments: No exam signs of significant ascites  Musculoskeletal:        General: No swelling. Normal range of motion.     Cervical back: Neck supple.  Skin:    General: Skin is warm and dry.  Neurological:     General: No focal deficit present.     Mental Status: She is alert.  Psychiatric:        Mood and Affect: Mood normal.      ED Results / Procedures / Treatments   Labs (all labs ordered are listed, but only abnormal results are displayed) Labs Reviewed  LACTIC ACID, PLASMA - Abnormal; Notable for the following components:      Result Value   Lactic Acid, Venous 3.1 (*)    All other components within normal limits  CBC WITH DIFFERENTIAL/PLATELET - Abnormal; Notable for the following components:   Hemoglobin 11.3 (*)    HCT 35.1 (*)    RDW 16.2 (*)    Platelets 108 (*)    Lymphs Abs 0.5 (*)    All other components within normal limits  COMPREHENSIVE METABOLIC PANEL - Abnormal; Notable for the following components:   Glucose,  Bld 56 (*)    Calcium 8.7 (*)    Albumin 3.1 (*)    AST 48 (*)    All other components within normal limits  D-DIMER, QUANTITATIVE (NOT AT [facility]) - Abnormal; Notable for the following  components:   D-Dimer, Quant 1.16 (*)    All other components within normal limits  LACTATE DEHYDROGENASE - Abnormal; Notable for the following components:   LDH 209 (*)    All other components within normal limits  C-REACTIVE PROTEIN - Abnormal; Notable for the following components:   CRP 3.6 (*)    All other components within normal limits  CBG MONITORING, ED - Abnormal; Notable for the following components:   Glucose-Capillary 34 (*)    All other components within normal limits  RESPIRATORY PANEL BY RT PCR (FLU A&B, COVID)  URINE CULTURE  CULTURE, BLOOD (ROUTINE X 2)  CULTURE, BLOOD (ROUTINE X 2)  PROCALCITONIN  FERRITIN  TRIGLYCERIDES  FIBRINOGEN  URINALYSIS, ROUTINE W REFLEX MICROSCOPIC  LACTIC ACID, PLASMA  PREGNANCY, URINE    EKG None  Radiology DG Chest Port 1 View  Result Date: 08/12/2020 CLINICAL DATA:  Shortness of breath EXAM: PORTABLE CHEST 1 VIEW COMPARISON:  July 29, 2020 chest radiograph and chest CT FINDINGS: There is bibasilar atelectasis. No edema or airspace opacity. Heart is mildly enlarged with pulmonary vascularity normal. No adenopathy. No bone lesions. IMPRESSION: Mild bibasilar atelectasis. Lungs otherwise clear. Mild cardiac enlargement. No adenopathy. Electronically Signed   By: Lowella Grip III M.D.   On: 08/12/2020 10:54    Procedures Procedures  Medications Ordered in the ED Medications  lactated ringers bolus 1,000 mL (1,000 mLs Intravenous New Bag/Given 08/12/20 1245)    And  lactated ringers bolus 1,000 mL (has no administration in time range)    And  lactated ringers bolus 200 mL (has no administration in time range)  metroNIDAZOLE (FLAGYL) IVPB 500 mg (has no administration in time range)  linezolid (ZYVOX) IVPB 600 mg (has no administration in time range)  ceFEPIme (MAXIPIME) 2 g in sodium chloride 0.9 % 100 mL IVPB (has no administration in time range)  ondansetron (ZOFRAN) injection 4 mg (4 mg Intravenous Given 08/12/20  1245)  ceFEPIme (MAXIPIME) 2 g in sodium chloride 0.9 % 100 mL IVPB (0 g Intravenous Stopped 08/12/20 1318)  dextrose 50 % solution 50 mL (50 mLs Intravenous Given 08/12/20 1329)     MDM Rules/Calculators/A&P MDM Patient with known cirrhosis and fluid retention with multiple prior visits for similar now also noted to be febrile on arrival of unclear etiology. She has had Covid vaccine, but will check that. She is requesting medications for pain and nausea. Will need to see her lab results to ensure safe to give APAP/Motrin. Zofran for nausea. Both the pain and the nausea appear to be chronic issues for her per GI note from last week. Given her rapid large weight gain and known anasarca with symptomatic fluid overload, will hold off on large volume fluids for her fever/tachycardia unless she develops hypotension or a significant lactic acidosis.  ED Course  I have reviewed the triage vital signs and the nursing notes.  Pertinent labs & imaging results that were available during my care of the patient were reviewed by me and considered in my medical decision making (see chart for details).  Clinical Course as of Aug 12 1333  Mon Aug 12, 2020  1145 Patient's CMP is normal. Will give APAP for fever/pain, patient has NSAID allergy. Lactic acid is  mildly elevated, will give fluid bolus and broad spectrum Abx for sepsis of unclear source. Spoke with Pharmacy who recommends Zyvox to replace Vanc due to allergy.    [CS]  1151 Glucose also low, will give D50   [CS]  1204 WBC are normal. Dimer mildly elevated (part of suspected Covid order set), she has recently been checked for DVT/PE, will not repeat today.    [CS]  1231 Procalcitonin is neg.    [CS]  6681 Spoke with Lab, Covid still has about 45 min left. Her Urine has been collected but not yet sent to the lab.    [CS]  5947 Covid/Flu is neg. Will consult Hospitalist for admission.    [CS]  1329 Discussed with Dr. Dwyane Dee, Hospitalist, who will  evaluate for admission. He requests a V/Q scan to eval elevated Dimer.    [CS]    Clinical Course User Index [CS] Truddie Hidden, MD    Final Clinical Impression(s) / ED Diagnoses Final diagnoses:  Sepsis, due to unspecified organism, unspecified whether acute organ dysfunction present [facility])  Anasarca    Rx / DC Orders ED Discharge Orders    None       Truddie Hidden, MD 08/12/20 1334

## 2020-08-13 DIAGNOSIS — R6520 Severe sepsis without septic shock: Secondary | ICD-10-CM | POA: Diagnosis not present

## 2020-08-13 DIAGNOSIS — A419 Sepsis, unspecified organism: Secondary | ICD-10-CM | POA: Diagnosis not present

## 2020-08-13 LAB — CORTISOL-AM, BLOOD: Cortisol - AM: 13.8 ug/dL (ref 6.7–22.6)

## 2020-08-13 LAB — PREGNANCY, URINE: Preg Test, Ur: NEGATIVE

## 2020-08-13 LAB — RAPID URINE DRUG SCREEN, HOSP PERFORMED
Amphetamines: NOT DETECTED
Barbiturates: NOT DETECTED
Benzodiazepines: NOT DETECTED
Cocaine: NOT DETECTED
Opiates: NOT DETECTED
Tetrahydrocannabinol: NOT DETECTED

## 2020-08-13 LAB — COMPREHENSIVE METABOLIC PANEL
ALT: 38 U/L (ref 0–44)
AST: 33 U/L (ref 15–41)
Albumin: 2.6 g/dL — ABNORMAL LOW (ref 3.5–5.0)
Alkaline Phosphatase: 72 U/L (ref 38–126)
Anion gap: 9 (ref 5–15)
BUN: 12 mg/dL (ref 6–20)
CO2: 25 mmol/L (ref 22–32)
Calcium: 7.8 mg/dL — ABNORMAL LOW (ref 8.9–10.3)
Chloride: 103 mmol/L (ref 98–111)
Creatinine, Ser: 0.55 mg/dL (ref 0.44–1.00)
GFR, Estimated: >60 mL/min (ref >60)
Glucose, Bld: 206 mg/dL — ABNORMAL HIGH (ref 70–99)
Potassium: 3.7 mmol/L (ref 3.5–5.1)
Sodium: 137 mmol/L (ref 135–145)
Total Bilirubin: 1 mg/dL (ref 0.3–1.2)
Total Protein: 5.7 g/dL — ABNORMAL LOW (ref 6.5–8.1)

## 2020-08-13 LAB — URINE CULTURE: Culture: NO GROWTH

## 2020-08-13 LAB — CBC
HCT: 28.5 % — ABNORMAL LOW (ref 36.0–46.0)
Hemoglobin: 9.2 g/dL — ABNORMAL LOW (ref 12.0–15.0)
MCH: 28.8 pg (ref 26.0–34.0)
MCHC: 32.3 g/dL (ref 30.0–36.0)
MCV: 89.3 fL (ref 80.0–100.0)
Platelets: 85 10*3/uL — ABNORMAL LOW (ref 150–400)
RBC: 3.19 MIL/uL — ABNORMAL LOW (ref 3.87–5.11)
RDW: 15.9 % — ABNORMAL HIGH (ref 11.5–15.5)
WBC: 4.3 10*3/uL (ref 4.0–10.5)
nRBC: 0 % (ref 0.0–0.2)

## 2020-08-13 LAB — GLUCOSE, CAPILLARY
Glucose-Capillary: 209 mg/dL — ABNORMAL HIGH (ref 70–99)
Glucose-Capillary: 279 mg/dL — ABNORMAL HIGH (ref 70–99)

## 2020-08-13 LAB — PROTIME-INR
INR: 1.2 (ref 0.8–1.2)
Prothrombin Time: 14.9 seconds (ref 11.4–15.2)

## 2020-08-13 LAB — HEMOGLOBIN A1C
Hgb A1c MFr Bld: 7.1 % — ABNORMAL HIGH (ref 4.8–5.6)
Mean Plasma Glucose: 157.07 mg/dL

## 2020-08-13 LAB — CBG MONITORING, ED
Glucose-Capillary: 163 mg/dL — ABNORMAL HIGH (ref 70–99)
Glucose-Capillary: 252 mg/dL — ABNORMAL HIGH (ref 70–99)
Glucose-Capillary: 318 mg/dL — ABNORMAL HIGH (ref 70–99)

## 2020-08-13 LAB — MRSA PCR SCREENING: MRSA by PCR: NEGATIVE

## 2020-08-13 LAB — PROCALCITONIN: Procalcitonin: 0.18 ng/mL

## 2020-08-13 MED ORDER — CHLORHEXIDINE GLUCONATE CLOTH 2 % EX PADS
6.0000 | MEDICATED_PAD | Freq: Every day | CUTANEOUS | Status: DC
  Administered 2020-08-13 – 2020-08-15 (×3): 6 via TOPICAL

## 2020-08-13 MED ORDER — METHADONE HCL 10 MG PO TABS
15.0000 mg | ORAL_TABLET | Freq: Every day | ORAL | Status: DC
  Administered 2020-08-13 – 2020-08-16 (×4): 15 mg via ORAL
  Filled 2020-08-13 (×4): qty 2

## 2020-08-13 NOTE — ED Notes (Signed)
Report called to [place], RN

## 2020-08-13 NOTE — ED Notes (Signed)
RN notified of CBG.

## 2020-08-13 NOTE — ED Notes (Signed)
ED TO INPATIENT HANDOFF REPORT  ED Nurse Name and Phone #: Angelica Pou [place] Name/Age/Gender Jaime [name] 34 y.o. female Room/Bed: APA15/APA15  Code Status   Code Status: Full Code  Home/SNF/Other Home Patient oriented to: self, place, time and situation Is this baseline? Yes   Triage Complete: Triage complete  Chief Complaint Severe sepsis ([place]) [A41.9, R65.20]  Triage Note Pt reports increase fluid retention from 278 lbs to 318 lbs in 2 weeks. Pt reports SOB, cp, Abdominal pain. Reports taking fluid medication but not urinating well. Pt unaware she had a fever    Allergies Allergies  Allergen Reactions  . Iodine-131 Anaphylaxis, Shortness Of Breath and Swelling  . Ivp Dye [Iodinated Diagnostic Agents] Anaphylaxis    Skin gets very red, unable to walk   . Ketorolac Tromethamine Shortness Of Breath  . Tylenol [Acetaminophen] Other (See Comments)    Due to cirrhosis   . Gabapentin   . Morphine And Related     Pt says she is not allergic to Morphine 08/12/20  . Nsaids Other (See Comments)    Flares ulcers  . Suboxone [Buprenorphine Hcl-Naloxone Hcl]   . Vancomycin Swelling    Facial swelling,     Level of Care/Admitting Diagnosis ED Disposition    ED Disposition Condition [facility] Area: [place] [facility] [[id number]]  Level of Care: Telemetry [5]  Covid Evaluation: Confirmed COVID Negative  Diagnosis: Severe sepsis [facility]) [[id number]]  Admitting Physician: Tarry Kos  Attending Physician: Tarry Kos  Estimated length of stay: 3 - 4 days  Certification:: I certify this patient will need inpatient services for at least 2 midnights       B Medical/Surgery History Past Medical History:  Diagnosis Date  . Anxiety   . Asthma   . Chronic abdominal pain   . Chronic back pain   . Cirrhosis ([place])   . Cirrhosis ([place])   . COPD (chronic obstructive pulmonary disease) ([place])   . Depression   . Diabetes mellitus  without complication ([place])   . Diabetes mellitus, type II ([place])   . Hepatitis C   . Insomnia   . Long-term current use of methadone for opiate dependence ([place])   . Lupus ([place])   . Migraine headache   . Neuropathy   . Nocturnal seizures ([place])   . Peptic ulcer   . Spleen enlarged    Past Surgical History:  Procedure Laterality Date  . CHOLECYSTECTOMY       A IV Location/Drains/Wounds Patient Lines/Drains/Airways Status    Active Line/Drains/Airways    Name Placement date Placement time Site Days   Peripheral IV 08/12/20 Left Forearm 08/12/20  1953  Forearm  1   Urethral Catheter Annamaria Boots, RN Double-lumen 14 Fr. 08/12/20  1830  Double-lumen  1   Pressure Injury Deep Tissue Injury - Purple or maroon localized area of discolored intact skin or blood-filled blister due to damage of underlying soft tissue from pressure and/or shear. --  --             Intake/Output Last 24 hours  Intake/Output Summary (Last 24 hours) at 08/13/2020 1406 Last data filed at 08/13/2020 1100 Gross per 24 hour  Intake 1200 ml  Output 5600 ml  Net -4400 ml    Labs/Imaging Results for orders placed or performed during the hospital encounter of 08/12/20 (from the past 48 hour(s))  Respiratory Panel by RT PCR (Flu A&B, Covid) - Nasopharyngeal Swab  Status: None   Collection Time: 08/12/20 10:23 AM   Specimen: Nasopharyngeal Swab  Result Value Ref Range   SARS Coronavirus 2 by RT PCR NEGATIVE NEGATIVE    Comment: (NOTE) SARS-CoV-2 target nucleic acids are NOT DETECTED.  The SARS-CoV-2 RNA is generally detectable in upper respiratoy specimens during the acute phase of infection. The lowest concentration of SARS-CoV-2 viral copies this assay can detect is 131 copies/mL. A negative result does not preclude SARS-Cov-2 infection and should not be used as the sole basis for treatment or other patient management decisions. A negative result may occur with  improper specimen collection/handling,  submission of specimen other than nasopharyngeal swab, presence of viral mutation(s) within the areas targeted by this assay, and inadequate number of viral copies (<131 copies/mL). A negative result must be combined with clinical observations, patient history, and epidemiological information. The expected result is Negative.  Fact Sheet for Patients:  PinkCheek.be  Fact Sheet for Healthcare Providers:  GravelBags.it  This test is no t yet approved or cleared by the [place] FDA and  has been authorized for detection and/or diagnosis of SARS-CoV-2 by FDA under an Emergency Use Authorization (EUA). This EUA will remain  in effect (meaning this test can be used) for the duration of the COVID-19 declaration under Section 564(b)(1) of the Act, 21 U.S.C. section 360bbb-3(b)(1), unless the authorization is terminated or revoked sooner.     Influenza A by PCR NEGATIVE NEGATIVE   Influenza B by PCR NEGATIVE NEGATIVE    Comment: (NOTE) The Xpert Xpress SARS-CoV-2/FLU/RSV assay is intended as an aid in  the diagnosis of influenza from Nasopharyngeal swab specimens and  should not be used as a sole basis for treatment. Nasal washings and  aspirates are unacceptable for Xpert Xpress SARS-CoV-2/FLU/RSV  testing.  Fact Sheet for Patients: PinkCheek.be  Fact Sheet for Healthcare Providers: GravelBags.it  This test is not yet approved or cleared by the [place] FDA and  has been authorized for detection and/or diagnosis of SARS-CoV-2 by  FDA under an Emergency Use Authorization (EUA). This EUA will remain  in effect (meaning this test can be used) for the duration of the  Covid-19 declaration under Section 564(b)(1) of the Act, 21  U.S.C. section 360bbb-3(b)(1), unless the authorization is  terminated or revoked. Performed at [facility], [address],  [place], [place] [address]   Lactic acid, plasma     Status: Abnormal   Collection Time: 08/12/20 10:23 AM  Result Value Ref Range   Lactic Acid, Venous 3.1 (HH) 0.5 - 1.9 mmol/L    Comment: CRITICAL RESULT CALLED TO, READ BACK BY AND VERIFIED WITH: Danna Hefty  @1140  08/13/2020 kay Performed at [facility], [address], [place], [place] [address]   CBC WITH DIFFERENTIAL     Status: Abnormal   Collection Time: 08/12/20 10:23 AM  Result Value Ref Range   WBC 6.3 4.0 - 10.5 K/uL   RBC 3.89 3.87 - 5.11 MIL/uL   Hemoglobin 11.3 (L) 12.0 - 15.0 g/dL   HCT 35.1 (L) 36 - 46 %   MCV 90.2 80.0 - 100.0 fL   MCH 29.0 26.0 - 34.0 pg   MCHC 32.2 30.0 - 36.0 g/dL   RDW 16.2 (H) 11.5 - 15.5 %   Platelets 108 (L) 150 - 400 K/uL    Comment: Immature Platelet Fraction may be clinically indicated, consider ordering this additional test [id number]    nRBC 0.0 0.0 - 0.2 %   Neutrophils Relative %  80 %   Neutro Abs 5.2 1.7 - 7.7 K/uL   Lymphocytes Relative 7 %   Lymphs Abs 0.5 (L) 0.7 - 4.0 K/uL   Monocytes Relative 9 %   Monocytes Absolute 0.5 0.1 - 1.0 K/uL   Eosinophils Relative 2 %   Eosinophils Absolute 0.1 0.0 - 0.5 K/uL   Basophils Relative 1 %   Basophils Absolute 0.0 0.0 - 0.1 K/uL   Immature Granulocytes 1 %   Abs Immature Granulocytes 0.04 0.00 - 0.07 K/uL    Comment: Performed at [facility], [address] [place], [place] [address]  Comprehensive metabolic panel     Status: Abnormal   Collection Time: 08/12/20 10:23 AM  Result Value Ref Range   Sodium 136 135 - 145 mmol/L   Potassium 4.1 3.5 - 5.1 mmol/L   Chloride 104 98 - 111 mmol/L   CO2 23 22 - 32 mmol/L   Glucose, Bld 56 (L) 70 - 99 mg/dL    Comment: Glucose reference range applies only to samples taken after fasting for at least 8 hours.   BUN 15 6 - 20 mg/dL   Creatinine, Ser 0.56 0.44 - 1.00 mg/dL   Calcium 8.7 (L) 8.9 - 10.3 mg/dL   Total Protein 7.0 6.5 - 8.1 g/dL   Albumin 3.1 (L) 3.5 - 5.0 g/dL   AST 48 (H)  15 - 41 U/L   ALT 44 0 - 44 U/L   Alkaline Phosphatase 90 38 - 126 U/L   Total Bilirubin 1.1 0.3 - 1.2 mg/dL   GFR, Estimated >60 >60 mL/min   Anion gap 9 5 - 15    Comment: Performed at [facility], [address], [place], [place] [address]  D-dimer, quantitative     Status: Abnormal   Collection Time: 08/12/20 10:23 AM  Result Value Ref Range   D-Dimer, Quant 1.16 (H) 0.00 - 0.50 ug/mL-FEU    Comment: (NOTE) At the manufacturer cut-off value of 0.5 g/mL FEU, this assay has a negative predictive value of 95-100%.This assay is intended for use in conjunction with a clinical pretest probability (PTP) assessment model to exclude pulmonary embolism (PE) and deep venous thrombosis (DVT) in outpatients suspected of PE or DVT. Results should be correlated with clinical presentation. Performed at [facility] [address], [place], [place] [address]   Procalcitonin     Status: None   Collection Time: 08/12/20 10:23 AM  Result Value Ref Range   Procalcitonin 0.14 ng/mL    Comment:        Interpretation: PCT (Procalcitonin) <= 0.5 ng/mL: Systemic infection (sepsis) is not likely. Local bacterial infection is possible. (NOTE)       Sepsis PCT Algorithm           Lower Respiratory Tract                                      Infection PCT Algorithm    ----------------------------     ----------------------------         PCT < 0.25 ng/mL                PCT < 0.10 ng/mL          Strongly encourage             Strongly discourage   discontinuation of antibiotics    initiation of antibiotics    ----------------------------     -----------------------------  PCT 0.25 - 0.50 ng/mL            PCT 0.10 - 0.25 ng/mL               OR       >80% decrease in PCT            Discourage initiation of                                            antibiotics      Encourage discontinuation           of antibiotics    ----------------------------     -----------------------------         PCT >=  0.50 ng/mL              PCT 0.26 - 0.50 ng/mL               AND        <80% decrease in PCT             Encourage initiation of                                             antibiotics       Encourage continuation           of antibiotics    ----------------------------     -----------------------------        PCT >= 0.50 ng/mL                  PCT > 0.50 ng/mL               AND         increase in PCT                  Strongly encourage                                      initiation of antibiotics    Strongly encourage escalation           of antibiotics                                     -----------------------------                                           PCT <= 0.25 ng/mL                                                 OR                                        > 80% decrease in PCT  Discontinue / Do not initiate                                             antibiotics  Performed at [facility], [address] [place], [place] [address]   Lactate dehydrogenase     Status: Abnormal   Collection Time: 08/12/20 10:23 AM  Result Value Ref Range   LDH 209 (H) 98 - 192 U/L    Comment: Performed at [facility], [address] [place], [place] [address]  Ferritin     Status: None   Collection Time: 08/12/20 10:23 AM  Result Value Ref Range   Ferritin 63 11 - 307 ng/mL    Comment: Performed at [facility], [address], [place], [place] [address]  Triglycerides     Status: None   Collection Time: 08/12/20 10:23 AM  Result Value Ref Range   Triglycerides 86 <150 mg/dL    Comment: Performed at [facility], [address], [place], [place] [address]  Fibrinogen     Status: None   Collection Time: 08/12/20 10:23 AM  Result Value Ref Range   Fibrinogen 428 210 - 475 mg/dL    Comment: Performed at [facility], [address] [place], [place] [address]  C-reactive protein     Status: Abnormal   Collection Time: 08/12/20 10:23 AM  Result Value  Ref Range   CRP 3.6 (H) <1.0 mg/dL    Comment: Performed at [facility], [address], [place], [place] [address]  CBG monitoring, ED     Status: Abnormal   Collection Time: 08/12/20 11:49 AM  Result Value Ref Range   Glucose-Capillary 34 (LL) 70 - 99 mg/dL    Comment: Glucose reference range applies only to samples taken after fasting for at least 8 hours.  Pregnancy, urine     Status: None   Collection Time: 08/12/20 12:39 PM  Result Value Ref Range   Preg Test, Ur NEGATIVE NEGATIVE    Comment:        THE SENSITIVITY OF THIS METHODOLOGY IS >20 mIU/mL. Performed at [facility], [address] [place], [place] [address]   Urinalysis, Routine w reflex microscopic Urine, Random     Status: Abnormal   Collection Time: 08/12/20 12:40 PM  Result Value Ref Range   Color, Urine YELLOW YELLOW   APPearance CLOUDY (A) CLEAR   Specific Gravity, Urine 1.018 1.005 - 1.030   pH 6.0 5.0 - 8.0   Glucose, UA NEGATIVE NEGATIVE mg/dL   Hgb urine dipstick NEGATIVE NEGATIVE   Bilirubin Urine NEGATIVE NEGATIVE   Ketones, ur NEGATIVE NEGATIVE mg/dL   Protein, ur NEGATIVE NEGATIVE mg/dL   Nitrite NEGATIVE NEGATIVE   Leukocytes,Ua NEGATIVE NEGATIVE    Comment: Performed at [facility], [address], [place], [place] [address]  Urine culture     Status: None   Collection Time: 08/12/20 12:40 PM   Specimen: Urine, Clean Catch  Result Value Ref Range   Specimen Description      URINE, CLEAN CATCH Performed at [facility], [address] [place], [place] [address]    Special Requests      NONE Performed at [facility], [address], [place], [place] [address]    Culture      NO GROWTH Performed at [facility] [address], [place], [place] [address]  Report Status 08/13/2020 FINAL   Lactic acid, plasma     Status: Abnormal   Collection Time: 08/12/20 12:46 PM  Result Value Ref Range   Lactic Acid, Venous 2.4 (HH) 0.5 - 1.9 mmol/L    Comment: CRITICAL RESULT CALLED TO, READ  BACK BY AND VERIFIED WITH: jennifer [email or web address],rn @1353  08/12/2020 kay Performed at [facility], [address], [place], [place] [address]   CBG monitoring, ED     Status: Abnormal   Collection Time: 08/13/20 12:38 AM  Result Value Ref Range   Glucose-Capillary 252 (H) 70 - 99 mg/dL    Comment: Glucose reference range applies only to samples taken after fasting for at least 8 hours.  Protime-INR     Status: None   Collection Time: 08/13/20  5:38 AM  Result Value Ref Range   Prothrombin Time 14.9 11.4 - 15.2 seconds   INR 1.2 0.8 - 1.2    Comment: (NOTE) INR goal varies based on device and disease states. Performed at [facility], [address], [place], [place] [address]   Cortisol-am, blood     Status: None   Collection Time: 08/13/20  5:38 AM  Result Value Ref Range   Cortisol - AM 13.8 6.7 - 22.6 ug/dL    Comment: Performed at [place] [address], [place],  [address]  Procalcitonin     Status: None   Collection Time: 08/13/20  5:38 AM  Result Value Ref Range   Procalcitonin 0.18 ng/mL    Comment:        Interpretation: PCT (Procalcitonin) <= 0.5 ng/mL: Systemic infection (sepsis) is not likely. Local bacterial infection is possible. (NOTE)       Sepsis PCT Algorithm           Lower Respiratory Tract                                      Infection PCT Algorithm    ----------------------------     ----------------------------         PCT < 0.25 ng/mL                PCT < 0.10 ng/mL          Strongly encourage             Strongly discourage   discontinuation of antibiotics    initiation of antibiotics    ----------------------------     -----------------------------       PCT 0.25 - 0.50 ng/mL            PCT 0.10 - 0.25 ng/mL               OR       >80% decrease in PCT            Discourage initiation of                                            antibiotics      Encourage discontinuation           of antibiotics    ----------------------------      -----------------------------         PCT >= 0.50 ng/mL              PCT 0.26 -  0.50 ng/mL               AND        <80% decrease in PCT             Encourage initiation of                                             antibiotics       Encourage continuation           of antibiotics    ----------------------------     -----------------------------        PCT >= 0.50 ng/mL                  PCT > 0.50 ng/mL               AND         increase in PCT                  Strongly encourage                                      initiation of antibiotics    Strongly encourage escalation           of antibiotics                                     -----------------------------                                           PCT <= 0.25 ng/mL                                                 OR                                        > 80% decrease in PCT                                      Discontinue / Do not initiate                                             antibiotics  Performed at [facility], [address], [place], [place] [address]   Comprehensive metabolic panel     Status: Abnormal   Collection Time: 08/13/20  5:38 AM  Result Value Ref Range   Sodium 137 135 - 145 mmol/L   Potassium 3.7 3.5 - 5.1 mmol/L   Chloride 103 98 - 111 mmol/L   CO2 25 22 - 32 mmol/L   Glucose, Bld 206 (H) 70 - 99 mg/dL    Comment: Glucose reference range  applies only to samples taken after fasting for at least 8 hours.   BUN 12 6 - 20 mg/dL   Creatinine, Ser 0.55 0.44 - 1.00 mg/dL   Calcium 7.8 (L) 8.9 - 10.3 mg/dL   Total Protein 5.7 (L) 6.5 - 8.1 g/dL   Albumin 2.6 (L) 3.5 - 5.0 g/dL   AST 33 15 - 41 U/L   ALT 38 0 - 44 U/L   Alkaline Phosphatase 72 38 - 126 U/L   Total Bilirubin 1.0 0.3 - 1.2 mg/dL   GFR, Estimated >60 >60 mL/min   Anion gap 9 5 - 15    Comment: Performed at [facility], [address] [place], [place] [address]  CBC     Status: Abnormal   Collection Time: 08/13/20  5:38 AM  Result  Value Ref Range   WBC 4.3 4.0 - 10.5 K/uL   RBC 3.19 (L) 3.87 - 5.11 MIL/uL   Hemoglobin 9.2 (L) 12.0 - 15.0 g/dL   HCT 28.5 (L) 36 - 46 %   MCV 89.3 80.0 - 100.0 fL   MCH 28.8 26.0 - 34.0 pg   MCHC 32.3 30.0 - 36.0 g/dL   RDW 15.9 (H) 11.5 - 15.5 %   Platelets 85 (L) 150 - 400 K/uL    Comment: PLATELET COUNT CONFIRMED BY SMEAR SPECIMEN CHECKED FOR CLOTS Immature Platelet Fraction may be clinically indicated, consider ordering this additional test [id number]    nRBC 0.0 0.0 - 0.2 %    Comment: Performed at [facility], [address] [place], [place] [address]  CBG monitoring, ED     Status: Abnormal   Collection Time: 08/13/20  8:48 AM  Result Value Ref Range   Glucose-Capillary 163 (H) 70 - 99 mg/dL    Comment: Glucose reference range applies only to samples taken after fasting for at least 8 hours.  CBG monitoring, ED     Status: Abnormal   Collection Time: 08/13/20 11:52 AM  Result Value Ref Range   Glucose-Capillary 318 (H) 70 - 99 mg/dL    Comment: Glucose reference range applies only to samples taken after fasting for at least 8 hours.   NM Pulmonary Perfusion  Result Date: 08/12/2020 CLINICAL DATA:  34 year old female with positive D-dimer. EXAM: NUCLEAR MEDICINE PERFUSION LUNG SCAN TECHNIQUE: Perfusion images were obtained in multiple projections after intravenous injection of radiopharmaceutical. Ventilation scans intentionally deferred if perfusion scan and chest x-ray adequate for interpretation during COVID 19 epidemic. RADIOPHARMACEUTICALS:  4.4 mCi Tc-80mMAA IV COMPARISON:  Chest radiograph dated 08/12/2020. FINDINGS: There is homogeneous perfusion.  No perfusion defect identified. IMPRESSION: Normal perfusion scan. Electronically Signed   By: AAnner CreteM.D.   On: 08/12/2020 16:11   DG Chest Port 1 View  Result Date: 08/12/2020 CLINICAL DATA:  Shortness of breath EXAM: PORTABLE CHEST 1 VIEW COMPARISON:  July 29, 2020 chest radiograph and chest CT  FINDINGS: There is bibasilar atelectasis. No edema or airspace opacity. Heart is mildly enlarged with pulmonary vascularity normal. No adenopathy. No bone lesions. IMPRESSION: Mild bibasilar atelectasis. Lungs otherwise clear. Mild cardiac enlargement. No adenopathy. Electronically Signed   By: WLowella GripIII M.D.   On: 08/12/2020 10:54    Pending Labs Unresulted Labs (From admission, onward)          Start     Ordered   08/13/20 0909  MRSA PCR Screening  Once,   STAT        08/13/20 0908   08/12/20 1023  Blood Culture (routine x 2)  BLOOD CULTURE X 2,   STAT      08/12/20 1023          Vitals/Pain Today's Vitals   08/13/20 0830 08/13/20 0853 08/13/20 0935 08/13/20 1222  BP: 112/69   122/65  Pulse: (!) 111   (!) 105  Resp: 15   (!) 26  Temp:      TempSrc:      SpO2: 95%   95%  Weight:      Height:      PainSc: 9  10-Worst pain ever 9      Isolation Precautions No active isolations  Medications Medications  lactated ringers bolus 1,000 mL (0 mLs Intravenous Stopped 08/12/20 1446)    And  lactated ringers bolus 1,000 mL (0 mLs Intravenous Stopped 08/12/20 2126)    And  lactated ringers bolus 200 mL (200 mLs Intravenous Not Given 08/12/20 1600)  linezolid (ZYVOX) IVPB 600 mg (600 mg Intravenous New Bag/Given 08/13/20 1226)  ceFEPIme (MAXIPIME) 2 g in sodium chloride 0.9 % 100 mL IVPB (2 g Intravenous New Bag/Given (Non-Interop) 08/13/20 1340)  metroNIDAZOLE (FLAGYL) IVPB 500 mg (0 mg Intravenous Stopped 08/13/20 0732)  aspirin EC tablet 162 mg ( Oral Canceled Entry 08/13/20 0846)  lisinopril (ZESTRIL) tablet 5 mg ( Oral Canceled Entry 08/13/20 0847)  propranolol (INDERAL) tablet 10 mg ( Oral Canceled Entry 08/13/20 0848)  rosuvastatin (CRESTOR) tablet 5 mg ( Oral Canceled Entry 08/13/20 0849)  spironolactone (ALDACTONE) tablet 200 mg ( Oral Canceled Entry 08/13/20 0850)  QUEtiapine (SEROQUEL) tablet 25 mg (25 mg Oral Given 08/12/20 2209)  dicyclomine (BENTYL)  capsule 10 mg (has no administration in time range)  docusate sodium (COLACE) capsule 100 mg (100 mg Oral Given 08/13/20 1340)  ondansetron (ZOFRAN-ODT) disintegrating tablet 4 mg (has no administration in time range)  pantoprazole (PROTONIX) EC tablet 40 mg ( Oral Canceled Entry 08/13/20 0848)  sucralfate (CARAFATE) tablet 1 g (1 g Oral Given 08/13/20 1227)  acetaminophen (TYLENOL) tablet 650 mg (has no administration in time range)    Or  acetaminophen (TYLENOL) suppository 650 mg (has no administration in time range)  senna (SENOKOT) tablet 8.6 mg ( Oral Canceled Entry 08/13/20 0850)  ondansetron (ZOFRAN) injection 4 mg (has no administration in time range)  furosemide (LASIX) injection 40 mg (40 mg Intravenous Given 08/13/20 1339)  psyllium (HYDROCIL/METAMUCIL) 1 packet ( Oral Canceled Entry 08/13/20 0849)  ipratropium-albuterol (DUONEB) 0.5-2.5 (3) MG/3ML nebulizer solution 3 mL (has no administration in time range)  insulin aspart (novoLOG) injection 0-15 Units (11 Units Subcutaneous Given 08/13/20 1220)  oxyCODONE (Oxy IR/ROXICODONE) immediate release tablet 5 mg (5 mg Oral Given 08/13/20 1339)  ondansetron (ZOFRAN) injection 4 mg (4 mg Intravenous Given 08/12/20 1245)  ceFEPIme (MAXIPIME) 2 g in sodium chloride 0.9 % 100 mL IVPB (0 g Intravenous Stopped 08/12/20 1318)  metroNIDAZOLE (FLAGYL) IVPB 500 mg (0 mg Intravenous Stopped 08/12/20 1446)  dextrose 50 % solution 50 mL (50 mLs Intravenous Given 08/12/20 1329)  technetium albumin aggregated (MAA) injection solution 4.4 millicurie (4.4 millicuries Intravenous Contrast Given 08/12/20 1520)    Mobility walks with person assist Moderate fall risk   Focused Assessments    R Recommendations: See Admitting Provider Note  Report given to:   Additional Notes:

## 2020-08-13 NOTE — Progress Notes (Signed)
Inpatient Diabetes Program Recommendations  AACE/ADA: New Consensus Statement on Inpatient Glycemic Control (2015)  Target Ranges:  Prepandial:   less than 140 mg/dL      Peak postprandial:   less than 180 mg/dL (1-2 hours)      Critically ill patients:  140 - 180 mg/dL   Lab Results  Component Value Date   GLUCAP 318 (H) 08/13/2020   HGBA1C 8.0 (H) 07/29/2020    Review of Glycemic Control  Diabetes history: DM 2, Sees Rayetta Pigg, NP with Endocrinology established on 07/22/2020 Outpatient Diabetes medications: 70/30 90 units qam, 80 units qpm, Metformin 1000 mg bid, Victoza 11.8 mg Daily Current orders for Inpatient glycemic control:  Novolog 0-15 units tid  Was on prednisone for URI Stopped on 9/11 Last visit with Endocrinology NP was 10/5 A1c 8% on 9/27  Inpatient Diabetes Program Recommendations:    -  Carb modified diet -  Lantus 30 units bid (based on home regimen) -  Add Novolog hs scale  Thanks,  Tama Headings RN, MSN, BC-ADM Inpatient Diabetes Coordinator Team Pager [phone] (8a-5p)

## 2020-08-13 NOTE — ED Notes (Signed)
Pt assisted to a chair at this time.

## 2020-08-13 NOTE — ED Notes (Signed)
RN notified pt would like pain medication.

## 2020-08-13 NOTE — Progress Notes (Signed)
PROGRESS NOTE    Jaime [name]  PNT:[id number] DOB: 1986/02/29 DOA: 08/12/2020 PCP: Neale Burly, MD    Brief Narrative: This 34 y.o. female with medical history significant of cirrhosis, IV drug abuse, hepatitis C s/p treatment, diabetes mellitus, COPD, Morbid obesity , history of contrast allergy  presents in the emergency department with worsening shortness of breath. She reports gaining more than 30 pounds in last 2 weeks since her discharge from hospital.  She was found to be septic at admission. She is admitted for severe sepsis with unknown source, generalized edema with 20 pound weight gain in the last 2 weeks.   Assessment & Plan:   Active Problems:   Severe sepsis (HCC)  Severe sepsis,  unknown cause:  Patient presents with fever 102.4 , tachycardia, tachypnea,  Hypoxia,  lactic acid 3.1 Chest x-ray bibasilar atelectasis, UA:  Unremarkable. Source undetermined at this time. Admitted to stepdown, Lactic acid improved to 2.3 after fluid bolus. Patient received  Linezolid, cefepime,  metronidazole in the ED.Marland Kitchen Continue same antibiotics for now and deescalate once culture comes back. Blood and urine cultures negative so far. Continue Tylenol as needed for fever and pain. Linezolid discontinued, MRSA nares negative. Continue cefepime and metronidazole for now.  Generalized Edema : She reports gaining 20 pounds in last 2 weeks, She takes Lasix 40 mg twice daily. She is found to have bibasilar crackles with pedal edema 2+. We will continue Lasix 40 mg IV twice daily for diuresis. Daily intake and output charting, daily weight. Recent Echo 2 weeks ago shows EF 55 to 60%. No regional wall motion abnormality. Continue spironolactone.  Diabetes mellitus: Hold p.o. diabetic medications, Regular insulin sliding scale.   Obtain hemoglobin A1c.  Hypertension :  Continue propranolol and lisinopril.  Elevated D-dimer: She is found to have elevated D-dimer,  CTA chest  not completed due to the contrast allergy. VQ scan low probability for PE  Thrombocytopenia: Patient has chronic thrombocytopenia could be due to cirrhosis of liver.  Morbid obesity: Patient needs dietitian consult and counseling.  Chronic pain syndrome: Patient follows up with methadone clinic in [place] She takes methadone , verified with pharmacy,  Resume home dose of methadone.  IV drug abuse : We will obtain urine drug screen, She denies using any IV drug recently.  History of hepatitis C: She reports completing treatment for hepatitis C long time ago.  Acute hypoxic respiratory failure secondary to fluid overload and COPD : She does not use oxygen at baseline. She is requiring 2 L saturating 94%. wean oxygen to room air as tolerated. Continue inhalers, nebulization as needed.  DVT prophylaxis: SCDS Code Status: Full code Family Communication: No one at bedside Disposition Plan:  Status is: Inpatient  Remains inpatient appropriate because:Inpatient level of care appropriate due to severity of illness   Dispo: The patient is from: Home              Anticipated d/c is to: Home              Anticipated d/c date is: 2 days              Patient currently is not medically stable to d/c.  Consultants:    None  Procedures: None Antimicrobials:  Anti-infectives (From admission, onward)   Start     Dose/Rate Route Frequency Ordered Stop   08/12/20 2000  ceFEPIme (MAXIPIME) 2 g in sodium chloride 0.9 % 100 mL IVPB        2 g  200 mL/hr over 30 Minutes Intravenous Every 8 hours 08/12/20 1220     08/12/20 2000  metroNIDAZOLE (FLAGYL) IVPB 500 mg        500 mg 100 mL/hr over 60 Minutes Intravenous Every 8 hours 08/12/20 1339     08/12/20 1300  linezolid (ZYVOX) IVPB 600 mg        600 mg 300 mL/hr over 60 Minutes Intravenous Every 12 hours 08/12/20 1150     08/12/20 1200  ceFEPIme (MAXIPIME) 2 g in sodium chloride 0.9 % 100 mL IVPB        2 g 200 mL/hr over  30 Minutes Intravenous  Once 08/12/20 1150 08/12/20 1318   08/12/20 1200  metroNIDAZOLE (FLAGYL) IVPB 500 mg        500 mg 100 mL/hr over 60 Minutes Intravenous  Once 08/12/20 1150 08/12/20 1446      Subjective: Patient was seen and examined at bedside.  Overnight events noted.  She constantly complains about having pain and asked to resume her methadone.  Objective: Vitals:   08/13/20 0739 08/13/20 0830 08/13/20 1222 08/13/20 1400  BP: (!) 105/43 112/69 122/65 (!) 115/94  Pulse: (!) 105 (!) 111 (!) 105 (!) 104  Resp:  15 (!) 26 16  Temp:      TempSrc:      SpO2:  95% 95% 98%  Weight:      Height:        Intake/Output Summary (Last 24 hours) at 08/13/2020 1608 Last data filed at 08/13/2020 1326 Gross per 24 hour  Intake 1800 ml  Output 5600 ml  Net -3800 ml   Filed Weights   08/12/20 1005  Weight: (!) 144.2 kg    Examination:  General exam: Appears calm and comfortable. Respiratory system: Clear to auscultation. Respiratory effort normal. Cardiovascular system: S1 & S2 heard, RRR. No JVD, murmurs, rubs, gallops or clicks. No pedal edema. Gastrointestinal system: Abdomen is nondistended, soft and nontender. No organomegaly or masses felt. Normal bowel sounds heard. Central nervous system: Alert and oriented. No focal neurological deficits. Extremities: No Edema, No cyanosis, no clubbing. Skin: No rashes, lesions or ulcers Psychiatry: Judgement and insight appear normal. Mood & affect appropriate.     Data Reviewed: I have personally reviewed following labs and imaging studies  CBC: Recent Labs  Lab 08/12/20 1023 08/13/20 0538  WBC 6.3 4.3  NEUTROABS 5.2  --   HGB 11.3* 9.2*  HCT 35.1* 28.5*  MCV 90.2 89.3  PLT 108* 85*   Basic Metabolic Panel: Recent Labs  Lab 08/12/20 1023 08/13/20 0538  NA 136 137  K 4.1 3.7  CL 104 103  CO2 23 25  GLUCOSE 56* 206*  BUN 15 12  CREATININE 0.56 0.55  CALCIUM 8.7* 7.8*   GFR: Estimated Creatinine Clearance:  149.5 mL/min (by C-G formula based on SCr of 0.55 mg/dL). Liver Function Tests: Recent Labs  Lab 08/12/20 1023 08/13/20 0538  AST 48* 33  ALT 44 38  ALKPHOS 90 72  BILITOT 1.1 1.0  PROT 7.0 5.7*  ALBUMIN 3.1* 2.6*   No results for input(s): LIPASE, AMYLASE in the last 168 hours. No results for input(s): AMMONIA in the last 168 hours. Coagulation Profile: Recent Labs  Lab 08/13/20 0538  INR 1.2   Cardiac Enzymes: No results for input(s): CKTOTAL, CKMB, CKMBINDEX, TROPONINI in the last 168 hours. BNP (last 3 results) No results for input(s): PROBNP in the last 8760 hours. HbA1C: No results for input(s): HGBA1C in the last  72 hours. CBG: Recent Labs  Lab 08/12/20 1149 08/13/20 0038 08/13/20 0848 08/13/20 1152  GLUCAP 34* 252* 163* 318*   Lipid Profile: Recent Labs    08/12/20 1023  TRIG 86   Thyroid Function Tests: No results for input(s): TSH, T4TOTAL, FREET4, T3FREE, THYROIDAB in the last 72 hours. Anemia Panel: Recent Labs    08/12/20 1023  FERRITIN 63   Sepsis Labs: Recent Labs  Lab 08/12/20 1023 08/12/20 1246 08/13/20 0538  PROCALCITON 0.14  --  0.18  LATICACIDVEN 3.1* 2.4*  --     Recent Results (from the past 240 hour(s))  Respiratory Panel by RT PCR (Flu A&B, Covid) - Nasopharyngeal Swab     Status: None   Collection Time: 08/12/20 10:23 AM   Specimen: Nasopharyngeal Swab  Result Value Ref Range Status   SARS Coronavirus 2 by RT PCR NEGATIVE NEGATIVE Final    Comment: (NOTE) SARS-CoV-2 target nucleic acids are NOT DETECTED.  The SARS-CoV-2 RNA is generally detectable in upper respiratoy specimens during the acute phase of infection. The lowest concentration of SARS-CoV-2 viral copies this assay can detect is 131 copies/mL. A negative result does not preclude SARS-Cov-2 infection and should not be used as the sole basis for treatment or other patient management decisions. A negative result may occur with  improper specimen  collection/handling, submission of specimen other than nasopharyngeal swab, presence of viral mutation(s) within the areas targeted by this assay, and inadequate number of viral copies (<131 copies/mL). A negative result must be combined with clinical observations, patient history, and epidemiological information. The expected result is Negative.  Fact Sheet for Patients:  PinkCheek.be  Fact Sheet for Healthcare Providers:  GravelBags.it  This test is no t yet approved or cleared by the [place] FDA and  has been authorized for detection and/or diagnosis of SARS-CoV-2 by FDA under an Emergency Use Authorization (EUA). This EUA will remain  in effect (meaning this test can be used) for the duration of the COVID-19 declaration under Section 564(b)(1) of the Act, 21 U.S.C. section 360bbb-3(b)(1), unless the authorization is terminated or revoked sooner.     Influenza A by PCR NEGATIVE NEGATIVE Final   Influenza B by PCR NEGATIVE NEGATIVE Final    Comment: (NOTE) The Xpert Xpress SARS-CoV-2/FLU/RSV assay is intended as an aid in  the diagnosis of influenza from Nasopharyngeal swab specimens and  should not be used as a sole basis for treatment. Nasal washings and  aspirates are unacceptable for Xpert Xpress SARS-CoV-2/FLU/RSV  testing.  Fact Sheet for Patients: PinkCheek.be  Fact Sheet for Healthcare Providers: GravelBags.it  This test is not yet approved or cleared by the [place] FDA and  has been authorized for detection and/or diagnosis of SARS-CoV-2 by  FDA under an Emergency Use Authorization (EUA). This EUA will remain  in effect (meaning this test can be used) for the duration of the  Covid-19 declaration under Section 564(b)(1) of the Act, 21  U.S.C. section 360bbb-3(b)(1), unless the authorization is  terminated or revoked. Performed at [facility], [address], [place], [place] [address]   Urine culture     Status: None   Collection Time: 08/12/20 12:40 PM   Specimen: Urine, Clean Catch  Result Value Ref Range Status   Specimen Description   Final    URINE, CLEAN CATCH Performed at [facility] [address] [place], [place] [address]    Special Requests   Final    NONE Performed at [facility],  [address] [place], [place] [address]    Culture   Final    NO GROWTH Performed at [facility], [place] [address], [place], [place] [address]    Report Status 08/13/2020 FINAL  Final  MRSA PCR Screening     Status: None   Collection Time: 08/13/20  1:53 PM   Specimen: Nasal Mucosa; Nasopharyngeal  Result Value Ref Range Status   MRSA by PCR NEGATIVE NEGATIVE Final    Comment:        The GeneXpert MRSA Assay (FDA approved for NASAL specimens only), is one component of a comprehensive MRSA colonization surveillance program. It is not intended to diagnose MRSA infection nor to guide or monitor treatment for MRSA infections. Performed at [facility], [address], [place], [place] [address]          Radiology Studies: NM Pulmonary Perfusion  Result Date: 08/12/2020 CLINICAL DATA:  34 year old female with positive D-dimer. EXAM: NUCLEAR MEDICINE PERFUSION LUNG SCAN TECHNIQUE: Perfusion images were obtained in multiple projections after intravenous injection of radiopharmaceutical. Ventilation scans intentionally deferred if perfusion scan and chest x-ray adequate for interpretation during COVID 19 epidemic. RADIOPHARMACEUTICALS:  4.4 mCi Tc-29mMAA IV COMPARISON:  Chest radiograph dated 08/12/2020. FINDINGS: There is homogeneous perfusion.  No perfusion defect identified. IMPRESSION: Normal perfusion scan. Electronically Signed   By: AAnner CreteM.D.   On: 08/12/2020 16:11   DG Chest Port 1 View  Result Date: 08/12/2020 CLINICAL DATA:  Shortness of breath EXAM: PORTABLE CHEST 1 VIEW COMPARISON:   July 29, 2020 chest radiograph and chest CT FINDINGS: There is bibasilar atelectasis. No edema or airspace opacity. Heart is mildly enlarged with pulmonary vascularity normal. No adenopathy. No bone lesions. IMPRESSION: Mild bibasilar atelectasis. Lungs otherwise clear. Mild cardiac enlargement. No adenopathy. Electronically Signed   By: WLowella GripIII M.D.   On: 08/12/2020 10:54        Scheduled Meds: . aspirin EC  162 mg Oral Daily  . docusate sodium  100 mg Oral QID  . furosemide  40 mg Intravenous Q12H  . insulin aspart  0-15 Units Subcutaneous TID WC  . lisinopril  5 mg Oral Daily  . pantoprazole  40 mg Oral BID  . propranolol  10 mg Oral TID  . psyllium  1 packet Oral BID  . QUEtiapine  25 mg Oral QHS  . rosuvastatin  5 mg Oral Daily  . senna  1 tablet Oral BID  . spironolactone  200 mg Oral Daily  . sucralfate  1 g Oral TID AC & HS   Continuous Infusions: . ceFEPime (MAXIPIME) IV 2 g (08/13/20 1340)  . lactated ringers    . linezolid (ZYVOX) IV Stopped (08/13/20 1326)  . metronidazole 500 mg (08/13/20 1415)     LOS: 1 day    Time spent: 35 mins.    PShawna Clamp MD [facility]   If 7PM-7AM, please contact night-coverage

## 2020-08-14 DIAGNOSIS — E119 Type 2 diabetes mellitus without complications: Secondary | ICD-10-CM

## 2020-08-14 DIAGNOSIS — G894 Chronic pain syndrome: Secondary | ICD-10-CM | POA: Diagnosis not present

## 2020-08-14 DIAGNOSIS — F1120 Opioid dependence, uncomplicated: Secondary | ICD-10-CM | POA: Diagnosis not present

## 2020-08-14 DIAGNOSIS — E8770 Fluid overload, unspecified: Secondary | ICD-10-CM | POA: Diagnosis present

## 2020-08-14 DIAGNOSIS — E8779 Other fluid overload: Secondary | ICD-10-CM

## 2020-08-14 DIAGNOSIS — E1165 Type 2 diabetes mellitus with hyperglycemia: Secondary | ICD-10-CM

## 2020-08-14 LAB — BASIC METABOLIC PANEL
Anion gap: 14 (ref 5–15)
BUN: 13 mg/dL (ref 6–20)
CO2: 28 mmol/L (ref 22–32)
Calcium: 8.3 mg/dL — ABNORMAL LOW (ref 8.9–10.3)
Chloride: 93 mmol/L — ABNORMAL LOW (ref 98–111)
Creatinine, Ser: 0.72 mg/dL (ref 0.44–1.00)
GFR, Estimated: >60 mL/min (ref >60)
Glucose, Bld: 311 mg/dL — ABNORMAL HIGH (ref 70–99)
Potassium: 3.5 mmol/L (ref 3.5–5.1)
Sodium: 135 mmol/L (ref 135–145)

## 2020-08-14 LAB — GLUCOSE, CAPILLARY
Glucose-Capillary: 345 mg/dL — ABNORMAL HIGH (ref 70–99)
Glucose-Capillary: 310 mg/dL — ABNORMAL HIGH (ref 70–99)
Glucose-Capillary: 177 mg/dL — ABNORMAL HIGH (ref 70–99)
Glucose-Capillary: 153 mg/dL — ABNORMAL HIGH (ref 70–99)

## 2020-08-14 LAB — CBC
HCT: 35.8 % — ABNORMAL LOW (ref 36.0–46.0)
Hemoglobin: 11.6 g/dL — ABNORMAL LOW (ref 12.0–15.0)
MCH: 28.6 pg (ref 26.0–34.0)
MCHC: 32.4 g/dL (ref 30.0–36.0)
MCV: 88.4 fL (ref 80.0–100.0)
Platelets: 86 10*3/uL — ABNORMAL LOW (ref 150–400)
RBC: 4.05 MIL/uL (ref 3.87–5.11)
RDW: 15.8 % — ABNORMAL HIGH (ref 11.5–15.5)
WBC: 5.8 10*3/uL (ref 4.0–10.5)
nRBC: 0 % (ref 0.0–0.2)

## 2020-08-14 LAB — MAGNESIUM: Magnesium: 1.8 mg/dL (ref 1.7–2.4)

## 2020-08-14 LAB — PHOSPHORUS: Phosphorus: 3 mg/dL (ref 2.5–4.6)

## 2020-08-14 MED ORDER — INSULIN GLARGINE 100 UNIT/ML ~~LOC~~ SOLN
25.0000 [IU] | Freq: Two times a day (BID) | SUBCUTANEOUS | Status: DC
  Administered 2020-08-14 – 2020-08-17 (×7): 25 [IU] via SUBCUTANEOUS
  Filled 2020-08-14 (×11): qty 0.25

## 2020-08-14 MED ORDER — SODIUM CHLORIDE 0.9 % IV SOLN
INTRAVENOUS | Status: DC | PRN
  Administered 2020-08-16: 250 mL via INTRAVENOUS

## 2020-08-14 NOTE — Progress Notes (Addendum)
[facility]  PROGRESS NOTE  Jaime [name] YCX:[id number] DOB: 09/13/1986 DOA: 08/12/2020 PCP: Neale Burly, MD Admit date - 08/12/2020   Admitting Physician Shawna Clamp, MD  Outpatient Primary MD for the patient is Neale Burly, MD  LOS - 2 Brief Narrative   Jaime [name] is a 34 y.o. year old female with Medical history significant for NAFLD/NASH cirrhosis, prior history of IV drug use and hep C  (status post treatment, abstained for the past 10 years) class III obesity, type 2 diabetes, chronic pain on methadone,  who presented on 08/12/2020 with shortness of breath and 20 pound weight gain in the past 2 weeks and was found to have fever, tachycardia, tachypnea, hypoxia and lactic acidosis concerning for severe sepsis of unclear etiology based off work-up.  Initially required linezolid, cefepime, metronidazole.  Linezolid discontinued after MRSA PCR screen negative.  Found to have elevated D-dimer however underwent VQ scan which showed low probability for PE (has contrast allergy unable to do CTA chest).  During hospital course patient has been continued on IV antibiotics as well as IV Lasix for volume overload.     Subjective  Today she still has a mild cough but associated rib cage/abdominal pain.  Feels her breathing is getting better.  A & P   Severe sepsis of unclear etiology.  Has remained afebrile and sepsis physiology is now resolved. Blood cultures unremarkable to date -Continue IV cefepime (Pseudomonas is a risk factor given known COPD), plan to transition to oral pseudomonal coverage -Discontinue IV Flagyl -Continue to monitor  Volume overload likely multifactorial. Hx of CHF with preserved EFPatient does have history of cirrhosis as well which could also be contributing.  She has had a robust response to scheduled IV Lasix 40 mg twice daily with net -11 L this hospitalization. -I think she needs a least 1 more day of IV Lasix before transitioning to oral  Lasix to ensure adequate volume status -Continue sodium/ fluid (1800 mL) restriction, daily weights -Continue spironolactone  Gastritis noted on EGD on 06/2020 without any varices -continue p.o. PPI, continue sucralfate  Cirrhosis, currently compensated.  Has history of hep C that has been treated.  Followed by GI who presumed this is more likely NAFLD/NASH cirrhosis -Continue follow-up with GI  COPD, stable.  No wheezing, no current O2 requirements -Continue home inhalers  Type 2 diabetes with hyperglycemia, poorly controlled.  A1c 7.1.  Still having elevated blood glucose in the 300s -Home medicines include Humulin 70/30 90 in a.m., 80 p.m.; Victoza; Metformin -start Lantus 25 units twice daily, continue sliding scale with meals, monitor CBGs  Thrombocytopenia, stable.  In the setting of known chronic liver disease.  No signs or symptoms of bleeding -Monitor CBC  Chronic pain, stable -Continue methadone 50 mg daily -Patient reports taking higher dose as outpatient, will cooperate with her clinic ([facility] at [phone])  HTN, stable-continue lisinopril, Inderal  HLD, stable-continue Crestor  Mood disorder, stable-continue Seroquel  Family Communication  : None  Code Status : Full  Disposition Plan  :  Patient is from home. Anticipated d/c date: 2 to 3 days. Barriers to d/c or necessity for inpatient status:  Still requiring IV antibiotics, slowly weaning off M monitoring continue clinical improvement, anticipate being able to discontinue IV Lasix 24 hours if continues to improve from a volume status standpoint Consults  : None  Procedures  : None  DVT Prophylaxis  : SCDs  MDM: The below labs and imaging reports were reviewed  and summarized above.  Medication management as above.  Lab Results  Component Value Date   PLT 86 (L) 08/14/2020    Diet :  Diet Order            Diet Carb Modified Fluid consistency: Thin; Room service appropriate? Yes   Diet effective now                  Inpatient Medications Scheduled Meds: . aspirin EC  162 mg Oral Daily  . Chlorhexidine Gluconate Cloth  6 each Topical Daily  . docusate sodium  100 mg Oral QID  . furosemide  40 mg Intravenous Q12H  . insulin aspart  0-15 Units Subcutaneous TID WC  . insulin glargine  25 Units Subcutaneous BID  . lisinopril  5 mg Oral Daily  . methadone  15 mg Oral Daily  . pantoprazole  40 mg Oral BID  . propranolol  10 mg Oral TID  . psyllium  1 packet Oral BID  . QUEtiapine  25 mg Oral QHS  . rosuvastatin  5 mg Oral Daily  . senna  1 tablet Oral BID  . spironolactone  200 mg Oral Daily  . sucralfate  1 g Oral TID AC & HS   Continuous Infusions: . sodium chloride    . ceFEPime (MAXIPIME) IV 2 g (08/14/20 1239)  . lactated ringers    . metronidazole 500 mg (08/14/20 1236)   PRN Meds:.sodium chloride, acetaminophen **OR** acetaminophen, dicyclomine, ipratropium-albuterol, [DISCONTINUED] ondansetron **OR** ondansetron (ZOFRAN) IV, ondansetron, oxyCODONE  Antibiotics  :   Anti-infectives (From admission, onward)   Start     Dose/Rate Route Frequency Ordered Stop   08/12/20 2000  ceFEPIme (MAXIPIME) 2 g in sodium chloride 0.9 % 100 mL IVPB        2 g 200 mL/hr over 30 Minutes Intravenous Every 8 hours 08/12/20 1220     08/12/20 2000  metroNIDAZOLE (FLAGYL) IVPB 500 mg        500 mg 100 mL/hr over 60 Minutes Intravenous Every 8 hours 08/12/20 1339     08/12/20 1300  linezolid (ZYVOX) IVPB 600 mg  Status:  Discontinued        600 mg 300 mL/hr over 60 Minutes Intravenous Every 12 hours 08/12/20 1150 08/13/20 1622   08/12/20 1200  ceFEPIme (MAXIPIME) 2 g in sodium chloride 0.9 % 100 mL IVPB        2 g 200 mL/hr over 30 Minutes Intravenous  Once 08/12/20 1150 08/12/20 1318   08/12/20 1200  metroNIDAZOLE (FLAGYL) IVPB 500 mg        500 mg 100 mL/hr over 60 Minutes Intravenous  Once 08/12/20 1150 08/12/20 1446       Objective   Vitals:   08/14/20  0201 08/14/20 0444 08/14/20 0913 08/14/20 1443  BP: 118/64 118/76 135/75 120/69  Pulse: 93 98  94  Resp: 20 20  16   Temp: 98.5 F (36.9 C) 98.6 F (37 C)  98.4 F (36.9 C)  TempSrc:      SpO2: 92% 96%  96%  Weight:      Height:        SpO2: 96 % O2 Flow Rate (L/min): 2 L/min  Wt Readings from Last 3 Encounters:  08/12/20 (!) 144.2 kg  08/06/20 133.5 kg  07/31/20 129.8 kg     Intake/Output Summary (Last 24 hours) at 08/14/2020 1609 Last data filed at 08/14/2020 0444 Gross per 24 hour  Intake --  Output 7700 ml  Net -7700 ml    Physical Exam:     Awake Alert, Oriented X 3, Normal affect No new F.N deficits,  [place].AT, Normal respiratory effort on room air, CTAB RRR,No Gallops,Rubs or new Murmurs,  +ve B.Sounds, Abd Soft, No tenderness, No rebound, guarding or rigidity. No Cyanosis, No new Rash or bruise     I have personally reviewed the following:   Data Reviewed:  CBC Recent Labs  Lab 08/12/20 1023 08/13/20 0538 08/14/20 0634  WBC 6.3 4.3 5.8  HGB 11.3* 9.2* 11.6*  HCT 35.1* 28.5* 35.8*  PLT 108* 85* 86*  MCV 90.2 89.3 88.4  MCH 29.0 28.8 28.6  MCHC 32.2 32.3 32.4  RDW 16.2* 15.9* 15.8*  LYMPHSABS 0.5*  --   --   MONOABS 0.5  --   --   EOSABS 0.1  --   --   BASOSABS 0.0  --   --     Chemistries  Recent Labs  Lab 08/12/20 1023 08/13/20 0538 08/14/20 0634  NA 136 137 135  K 4.1 3.7 3.5  CL 104 103 93*  CO2 23 25 28   GLUCOSE 56* 206* 311*  BUN 15 12 13   CREATININE 0.56 0.55 0.72  CALCIUM 8.7* 7.8* 8.3*  MG  --   --  1.8  AST 48* 33  --   ALT 44 38  --   ALKPHOS 90 72  --   BILITOT 1.1 1.0  --    ------------------------------------------------------------------------------------------------------------------ Recent Labs    08/12/20 1023  TRIG 86    Lab Results  Component Value Date   HGBA1C 7.1 (H) 08/13/2020    ------------------------------------------------------------------------------------------------------------------ No results for input(s): TSH, T4TOTAL, T3FREE, THYROIDAB in the last 72 hours.  Invalid input(s): FREET3 ------------------------------------------------------------------------------------------------------------------ Recent Labs    08/12/20 1023  FERRITIN 63    Coagulation profile Recent Labs  Lab 08/13/20 0538  INR 1.2    Recent Labs    08/12/20 1023  DDIMER 1.16*    Cardiac Enzymes No results for input(s): CKMB, TROPONINI, MYOGLOBIN in the last 168 hours.  Invalid input(s): CK ------------------------------------------------------------------------------------------------------------------    Component Value Date/Time   BNP 26.0 07/29/2020 1301    Micro Results Recent Results (from the past 240 hour(s))  Respiratory Panel by RT PCR (Flu A&B, Covid) - Nasopharyngeal Swab     Status: None   Collection Time: 08/12/20 10:23 AM   Specimen: Nasopharyngeal Swab  Result Value Ref Range Status   SARS Coronavirus 2 by RT PCR NEGATIVE NEGATIVE Final    Comment: (NOTE) SARS-CoV-2 target nucleic acids are NOT DETECTED.  The SARS-CoV-2 RNA is generally detectable in upper respiratoy specimens during the acute phase of infection. The lowest concentration of SARS-CoV-2 viral copies this assay can detect is 131 copies/mL. A negative result does not preclude SARS-Cov-2 infection and should not be used as the sole basis for treatment or other patient management decisions. A negative result may occur with  improper specimen collection/handling, submission of specimen other than nasopharyngeal swab, presence of viral mutation(s) within the areas targeted by this assay, and inadequate number of viral copies (<131 copies/mL). A negative result must be combined with clinical observations, patient history, and epidemiological information. The expected result is  Negative.  Fact Sheet for Patients:  PinkCheek.be  Fact Sheet for Healthcare Providers:  GravelBags.it  This test is no t yet approved or cleared by the [place] FDA and  has been authorized for detection and/or diagnosis of SARS-CoV-2 by FDA under an Emergency Use  Authorization (EUA). This EUA will remain  in effect (meaning this test can be used) for the duration of the COVID-19 declaration under Section 564(b)(1) of the Act, 21 U.S.C. section 360bbb-3(b)(1), unless the authorization is terminated or revoked sooner.     Influenza A by PCR NEGATIVE NEGATIVE Final   Influenza B by PCR NEGATIVE NEGATIVE Final    Comment: (NOTE) The Xpert Xpress SARS-CoV-2/FLU/RSV assay is intended as an aid in  the diagnosis of influenza from Nasopharyngeal swab specimens and  should not be used as a sole basis for treatment. Nasal washings and  aspirates are unacceptable for Xpert Xpress SARS-CoV-2/FLU/RSV  testing.  Fact Sheet for Patients: PinkCheek.be  Fact Sheet for Healthcare Providers: GravelBags.it  This test is not yet approved or cleared by the [place] FDA and  has been authorized for detection and/or diagnosis of SARS-CoV-2 by  FDA under an Emergency Use Authorization (EUA). This EUA will remain  in effect (meaning this test can be used) for the duration of the  Covid-19 declaration under Section 564(b)(1) of the Act, 21  U.S.C. section 360bbb-3(b)(1), unless the authorization is  terminated or revoked. Performed at [facility], [address], [place], [place] [address]   Blood Culture (routine x 2)     Status: None (Preliminary result)   Collection Time: 08/12/20 12:36 PM   Specimen: BLOOD  Result Value Ref Range Status   Specimen Description BLOOD  Final   Special Requests NONE  Final   Culture   Final    NO GROWTH 2 DAYS Performed at [facility], [address] [place], [place] [address]    Report Status PENDING  Incomplete  Urine culture     Status: None   Collection Time: 08/12/20 12:40 PM   Specimen: Urine, Clean Catch  Result Value Ref Range Status   Specimen Description   Final    URINE, CLEAN CATCH Performed at [facility], [address] [place], [place] [address]    Special Requests   Final    NONE Performed at [facility], [address] [place], [place] [address]    Culture   Final    NO GROWTH Performed at [facility], [place] [address] [place], [place] [address]    Report Status 08/13/2020 FINAL  Final  Blood Culture (routine x 2)     Status: None (Preliminary result)   Collection Time: 08/12/20 12:43 PM   Specimen: BLOOD  Result Value Ref Range Status   Specimen Description BLOOD  Final   Special Requests NONE  Final   Culture   Final    NO GROWTH 2 DAYS Performed at [facility], [address], [place], [place] [address]    Report Status PENDING  Incomplete  MRSA PCR Screening     Status: None   Collection Time: 08/13/20  1:53 PM   Specimen: Nasal Mucosa; Nasopharyngeal  Result Value Ref Range Status   MRSA by PCR NEGATIVE NEGATIVE Final    Comment:        The GeneXpert MRSA Assay (FDA approved for NASAL specimens only), is one component of a comprehensive MRSA colonization surveillance program. It is not intended to diagnose MRSA infection nor to guide or monitor treatment for MRSA infections. Performed at [facility], [address] [place],  [address]     Radiology Reports DG Chest 2 View  Result Date: 07/29/2020 CLINICAL DATA:  Shortness of breath, chest pain EXAM: CHEST - 2 VIEW COMPARISON:  05/26/2019  FINDINGS: The heart size and mediastinal contours are within normal limits. Slightly low lung volumes with mild bibasilar atelectasis. No focal airspace consolidation, pleural effusion, or pneumothorax. The visualized skeletal structures are unremarkable. IMPRESSION:  Slightly low lung volumes with mild bibasilar atelectasis. Otherwise, no acute cardiopulmonary findings. Electronically Signed   By: Davina Poke D.O.   On: 07/29/2020 11:55   CT Chest Wo Contrast  Result Date: 07/29/2020 CLINICAL DATA:  Shortness of breath and chest pain. EXAM: CT CHEST WITHOUT CONTRAST TECHNIQUE: Multidetector CT imaging of the chest was performed following the standard protocol without IV contrast. COMPARISON:  July 17, 2016 FINDINGS: Cardiovascular: No significant vascular findings. Normal heart size. No pericardial effusion. Mediastinum/Nodes: No enlarged mediastinal or axillary lymph nodes. Thyroid gland, trachea, and esophagus demonstrate no significant findings. Lungs/Pleura: A mild amount of atelectasis is seen along the inferior aspect of the left upper lobe. There is no evidence of a pleural effusion or pneumothorax. Upper Abdomen: Multiple surgical clips are seen within the gallbladder fossa. The liver is moderately enlarged. The spleen is markedly enlarged. Musculoskeletal: No chest wall mass or suspicious bone lesions identified. IMPRESSION: 1. Mild inferior left upper lobe atelectasis. 2. Hepatosplenomegaly. 3. Evidence of prior cholecystectomy. Electronically Signed   By: Virgina Norfolk M.D.   On: 07/29/2020 15:46   NM Pulmonary Perfusion  Result Date: 08/12/2020 CLINICAL DATA:  34 year old female with positive D-dimer. EXAM: NUCLEAR MEDICINE PERFUSION LUNG SCAN TECHNIQUE: Perfusion images were obtained in multiple projections after intravenous injection of radiopharmaceutical. Ventilation scans intentionally deferred if perfusion scan and chest x-ray adequate for interpretation during COVID 19 epidemic. RADIOPHARMACEUTICALS:  4.4 mCi Tc-60mMAA IV COMPARISON:  Chest radiograph dated 08/12/2020. FINDINGS: There is homogeneous perfusion.  No perfusion defect identified. IMPRESSION: Normal perfusion scan. Electronically Signed   By: AAnner CreteM.D.   On:  08/12/2020 16:11   NM Pulmonary Perfusion  Result Date: 07/30/2020 CLINICAL DATA:  PE suspected, high prob Chest pain and shortness of breath. EXAM: NUCLEAR MEDICINE PERFUSION LUNG SCAN TECHNIQUE: Perfusion images were obtained in multiple projections after intravenous injection of radiopharmaceutical. Ventilation scans intentionally deferred if perfusion scan and chest x-ray adequate for interpretation during COVID 19 epidemic. RADIOPHARMACEUTICALS:  4.1 mCi Tc-939mAA IV COMPARISON:  Chest radiograph and CT yesterday. FINDINGS: There is a homogeneous distribution of radiotracer throughout both lungs. No evidence of perfusion defects. IMPRESSION: No evidence of pulmonary embolus.  Normal perfusion exam. Electronically Signed   By: MeKeith Rake.D.   On: 07/30/2020 18:50   [place] Img Lower Bilateral (DVT)  Result Date: 07/30/2020 CLINICAL DATA:  Lower extremity edema EXAM: BILATERAL LOWER EXTREMITY VENOUS DOPPLER ULTRASOUND TECHNIQUE: Gray-scale sonography with graded compression, as well as color Doppler and duplex ultrasound were performed to evaluate the lower extremity deep venous systems from the level of the common femoral vein and including the common femoral, femoral, profunda femoral, popliteal and calf veins including the posterior tibial, peroneal and gastrocnemius veins when visible. The superficial great saphenous vein was also interrogated. Spectral Doppler was utilized to evaluate flow at rest and with distal augmentation maneuvers in the common femoral, femoral and popliteal veins. COMPARISON:  None. FINDINGS: RIGHT LOWER EXTREMITY Common Femoral Vein: No evidence of thrombus. Normal compressibility, respiratory phasicity and response to augmentation. Saphenofemoral Junction: No evidence of thrombus. Normal compressibility and flow on color Doppler imaging. Profunda Femoral Vein: No evidence of thrombus. Normal compressibility and flow on color Doppler imaging. Femoral Vein: No  evidence of thrombus. Normal compressibility, respiratory  phasicity and response to augmentation. Popliteal Vein: No evidence of thrombus. Normal compressibility, respiratory phasicity and response to augmentation. Calf Veins: No evidence of thrombus. Normal compressibility and flow on color Doppler imaging. LEFT LOWER EXTREMITY Common Femoral Vein: No evidence of thrombus. Normal compressibility, respiratory phasicity and response to augmentation. Saphenofemoral Junction: No evidence of thrombus. Normal compressibility and flow on color Doppler imaging. Profunda Femoral Vein: No evidence of thrombus. Normal compressibility and flow on color Doppler imaging. Femoral Vein: No evidence of thrombus. Normal compressibility, respiratory phasicity and response to augmentation. Popliteal Vein: No evidence of thrombus. Normal compressibility, respiratory phasicity and response to augmentation. Calf Veins: No evidence of thrombus. Normal compressibility and flow on color Doppler imaging. IMPRESSION: Limited exam because of body habitus. No evidence of significant deep venous thrombosis in either lower extremity. Electronically Signed   By: Jerilynn Mages.  Shick M.D.   On: 07/30/2020 15:44   DG Chest Port 1 View  Result Date: 08/12/2020 CLINICAL DATA:  Shortness of breath EXAM: PORTABLE CHEST 1 VIEW COMPARISON:  July 29, 2020 chest radiograph and chest CT FINDINGS: There is bibasilar atelectasis. No edema or airspace opacity. Heart is mildly enlarged with pulmonary vascularity normal. No adenopathy. No bone lesions. IMPRESSION: Mild bibasilar atelectasis. Lungs otherwise clear. Mild cardiac enlargement. No adenopathy. Electronically Signed   By: Lowella Grip III M.D.   On: 08/12/2020 10:54   ECHOCARDIOGRAM COMPLETE  Result Date: 07/31/2020    ECHOCARDIOGRAM REPORT   Patient Name:   ANOLA MCGOUGH Date of Exam: 07/31/2020 Medical Rec #:  [id number]        Height:       69.0 in Accession #:    [id number]       Weight:        302.0 lb Date of Birth:  08/19/1986         BSA:          2.462 m Patient Age:    35 years         BP:           140/88 mmHg Patient Gender: F                HR:           108 bpm. Exam Location:  Inpatient Procedure: 2D Echo, Cardiac Doppler and Color Doppler Indications:    CHF-Acute Diastolic 993.57 / S17.79  History:        Patient has prior history of Echocardiogram examinations, most                 recent 07/05/2019. COPD; Risk Factors:Diabetes.  Sonographer:    Bernadene Person RDCS Referring Phys: [id number] AMRIT ADHIKARI  Sonographer Comments: Technically difficult study due to poor echo windows. IMPRESSIONS  1. Left ventricular ejection fraction, by estimation, is 55 to 60%. The left ventricle has normal function. Left ventricular endocardial border not optimally defined to evaluate regional wall motion. Left ventricular diastolic parameters are indeterminate.  2. Right ventricular systolic function is normal. The right ventricular size is normal.  3. The mitral valve is grossly normal. Trivial mitral valve regurgitation.  4. The aortic valve was not well visualized. Aortic valve regurgitation is not visualized.  5. Unable to estimate CVP. FINDINGS  Left Ventricle: Left ventricular ejection fraction, by estimation, is 55 to 60%. The left ventricle has normal function. Left ventricular endocardial border not optimally defined to evaluate regional wall motion. The left ventricular internal cavity size was normal in size. There is  no left ventricular hypertrophy. Left ventricular diastolic parameters are indeterminate. Right Ventricle: The right ventricular size is normal. No increase in right ventricular wall thickness. Right ventricular systolic function is normal. Left Atrium: Left atrial size was normal in size. Right Atrium: Right atrial size was normal in size. Pericardium: There is no evidence of pericardial effusion. Presence of pericardial fat pad. Mitral Valve: The mitral valve is grossly normal.  Trivial mitral valve regurgitation. Tricuspid Valve: The tricuspid valve is grossly normal. Tricuspid valve regurgitation is trivial. Aortic Valve: The aortic valve was not well visualized. Aortic valve regurgitation is not visualized. Pulmonic Valve: The pulmonic valve was not well visualized. Pulmonic valve regurgitation is not visualized. Aorta: The aortic root is normal in size and structure. Venous: Unable to estimate CVP. The inferior vena cava was not well visualized. IAS/Shunts: The interatrial septum was not well visualized.  LEFT VENTRICLE PLAX 2D LVIDd:         5.74 cm  Diastology LVIDs:         3.97 cm  LV e' medial:    8.70 cm/s LV PW:         0.82 cm  LV E/e' medial:  9.6 LV IVS:        0.78 cm  LV e' lateral:   15.20 cm/s LVOT diam:     1.90 cm  LV E/e' lateral: 5.5 LV SV:         79 LV SV Index:   32 LVOT Area:     2.84 cm  RIGHT VENTRICLE RV S prime:     10.80 cm/s TAPSE (M-mode): 2.8 cm LEFT ATRIUM             Index       RIGHT ATRIUM           Index LA diam:        3.80 cm 1.54 cm/m  RA Area:     10.90 cm LA Vol (A2C):   26.9 ml 10.93 ml/m RA Volume:   21.40 ml  8.69 ml/m LA Vol (A4C):   46.4 ml 18.85 ml/m LA Biplane Vol: 36.8 ml 14.95 ml/m  AORTIC VALVE LVOT Vmax:   155.00 cm/s LVOT Vmean:  117.000 cm/s LVOT VTI:    0.278 m  AORTA Ao Root diam: 3.10 cm MITRAL VALVE               TRICUSPID VALVE MV Area (PHT): 4.29 cm    TR Peak grad:   11.2 mmHg MV Decel Time: 177 msec    TR Vmax:        167.00 cm/s MV E velocity: 83.90 cm/s MV A velocity: 85.90 cm/s  SHUNTS MV E/A ratio:  0.98        Systemic VTI:  0.28 m                            Systemic Diam: 1.90 cm Rozann Lesches MD Electronically signed by Rozann Lesches MD Signature Date/Time: 07/31/2020/11:51:14 AM    Final    [place] ASCITES (ABDOMEN LIMITED)  Result Date: 07/30/2020 CLINICAL DATA:  Cirrhosis, ascites EXAM: LIMITED ABDOMEN ULTRASOUND FOR ASCITES TECHNIQUE: Limited ultrasound survey for ascites was performed in all four  abdominal quadrants. COMPARISON:  07/29/2020 FINDINGS: Trace ascites. Insufficient ascites for paracentesis. IMPRESSION: Trace ascites, insufficient for paracentesis. Electronically Signed   By: Lavonia Dana M.D.   On: 07/30/2020 10:37   [place] ASCITES (ABDOMEN LIMITED)  Result Date:  07/29/2020 CLINICAL DATA:  Alcoholic cirrhosis, abdominal swelling EXAM: LIMITED ABDOMEN ULTRASOUND FOR ASCITES TECHNIQUE: Limited ultrasound survey for ascites was performed in all four abdominal quadrants. COMPARISON:  CT abdomen and pelvis 12/04/2019 FINDINGS: Minimal ascites identified. Insufficient ascites for paracentesis. IMPRESSION: Insufficient ascites for paracentesis. Electronically Signed   By: Lavonia Dana M.D.   On: 07/29/2020 16:57     Time Spent in minutes  30     Desiree Hane M.D on 08/14/2020 at 4:09 PM  To page go to [email or web address] - password [email or web address]

## 2020-08-15 ENCOUNTER — Inpatient Hospital Stay (HOSPITAL_COMMUNITY): Payer: Medicaid Other

## 2020-08-15 DIAGNOSIS — G894 Chronic pain syndrome: Secondary | ICD-10-CM | POA: Diagnosis not present

## 2020-08-15 DIAGNOSIS — K7460 Unspecified cirrhosis of liver: Secondary | ICD-10-CM | POA: Diagnosis not present

## 2020-08-15 DIAGNOSIS — F1120 Opioid dependence, uncomplicated: Secondary | ICD-10-CM | POA: Diagnosis not present

## 2020-08-15 DIAGNOSIS — E119 Type 2 diabetes mellitus without complications: Secondary | ICD-10-CM | POA: Diagnosis not present

## 2020-08-15 LAB — CBC WITH DIFFERENTIAL/PLATELET
Abs Immature Granulocytes: 0.28 10*3/uL — ABNORMAL HIGH (ref 0.00–0.07)
Basophils Absolute: 0.1 10*3/uL (ref 0.0–0.1)
Basophils Relative: 1 %
Eosinophils Absolute: 0.2 10*3/uL (ref 0.0–0.5)
Eosinophils Relative: 3 %
HCT: 35.2 % — ABNORMAL LOW (ref 36.0–46.0)
Hemoglobin: 11.4 g/dL — ABNORMAL LOW (ref 12.0–15.0)
Immature Granulocytes: 4 %
Lymphocytes Relative: 18 %
Lymphs Abs: 1.2 10*3/uL (ref 0.7–4.0)
MCH: 28.5 pg (ref 26.0–34.0)
MCHC: 32.4 g/dL (ref 30.0–36.0)
MCV: 88 fL (ref 80.0–100.0)
Monocytes Absolute: 0.7 10*3/uL (ref 0.1–1.0)
Monocytes Relative: 11 %
Neutro Abs: 4.2 10*3/uL (ref 1.7–7.7)
Neutrophils Relative %: 63 %
Platelets: 133 10*3/uL — ABNORMAL LOW (ref 150–400)
RBC: 4 MIL/uL (ref 3.87–5.11)
RDW: 15.9 % — ABNORMAL HIGH (ref 11.5–15.5)
WBC: 6.7 10*3/uL (ref 4.0–10.5)
nRBC: 0 % (ref 0.0–0.2)

## 2020-08-15 LAB — BASIC METABOLIC PANEL
Anion gap: 11 (ref 5–15)
BUN: 18 mg/dL (ref 6–20)
CO2: 31 mmol/L (ref 22–32)
Calcium: 8.4 mg/dL — ABNORMAL LOW (ref 8.9–10.3)
Chloride: 93 mmol/L — ABNORMAL LOW (ref 98–111)
Creatinine, Ser: 0.78 mg/dL (ref 0.44–1.00)
GFR, Estimated: >60 mL/min (ref >60)
Glucose, Bld: 194 mg/dL — ABNORMAL HIGH (ref 70–99)
Potassium: 3.2 mmol/L — ABNORMAL LOW (ref 3.5–5.1)
Sodium: 135 mmol/L (ref 135–145)

## 2020-08-15 LAB — GLUCOSE, CAPILLARY
Glucose-Capillary: 161 mg/dL — ABNORMAL HIGH (ref 70–99)
Glucose-Capillary: 245 mg/dL — ABNORMAL HIGH (ref 70–99)
Glucose-Capillary: 323 mg/dL — ABNORMAL HIGH (ref 70–99)
Glucose-Capillary: 395 mg/dL — ABNORMAL HIGH (ref 70–99)

## 2020-08-15 LAB — MAGNESIUM: Magnesium: 2 mg/dL (ref 1.7–2.4)

## 2020-08-15 MED ORDER — VENLAFAXINE HCL ER 75 MG PO CP24
225.0000 mg | ORAL_CAPSULE | Freq: Every day | ORAL | Status: DC
  Administered 2020-08-15 – 2020-08-17 (×3): 225 mg via ORAL
  Filled 2020-08-15 (×4): qty 3

## 2020-08-15 MED ORDER — CYCLOBENZAPRINE HCL 10 MG PO TABS: 10.0000 mg | ORAL_TABLET | Freq: Three times a day (TID) | ORAL | Status: DC | PRN

## 2020-08-15 MED ORDER — POTASSIUM CHLORIDE 20 MEQ PO PACK
40.0000 meq | PACK | Freq: Two times a day (BID) | ORAL | Status: AC
  Administered 2020-08-15 (×2): 40 meq via ORAL
  Filled 2020-08-15 (×2): qty 2

## 2020-08-15 MED ORDER — VENLAFAXINE HCL ER 75 MG PO CP24: 75.0000 mg | ORAL_CAPSULE | Freq: Every day | ORAL | Status: DC

## 2020-08-15 NOTE — Progress Notes (Addendum)
[facility]  PROGRESS NOTE  Jaime [name] TXH:[id number] DOB: [birth year] DOA: 08/12/2020 PCP: Neale Burly, MD Admit date - 08/12/2020   Admitting Physician Shawna Clamp, MD  Outpatient Primary MD for the patient is Neale Burly, MD  LOS - 3 Brief Narrative   Jaime [name] is a 34 y.o. year old female with Medical history significant for NAFLD/NASH cirrhosis, prior history of IV drug use and hep C  (status post treatment, abstained for the past 10 years) class III obesity, type 2 diabetes, chronic pain on methadone,  who presented on 08/12/2020 with shortness of breath and 20 pound weight gain in the past 2 weeks and was found to have fever, tachycardia, tachypnea, hypoxia and lactic acidosis concerning for severe sepsis of unclear etiology based off work-up.  Initially required linezolid, cefepime, metronidazole.  Linezolid discontinued after MRSA PCR screen negative.  Found to have elevated D-dimer however underwent VQ scan which showed low probability for PE (has contrast allergy unable to do CTA chest).  During hospital course patient has been continued on IV antibiotics as well as IV Lasix for volume overload.     Subjective  Today she continues to report cough that seemed to worsen overnight.  A & P   Severe sepsis of unclear etiology. Sepsis physiology is now resolved. No overt fevers, tmax of 100 and reported worsening cough Blood cultures unremarkable to date. Unable to get CXR because of device for CBG monitoring -Continue IV cefepime (Pseudomonas is a risk factor given reported hx ofCOPD), plan to transition to oral pseudomonal coverage once clinically stable -Discontinued IV Flagyl, 10/13 -Continue to monitor  Volume overload likely multifactorial. Biggest driver could be cirrhosis? In review of recent TTE no noted diastolic dysfunction; also has hypoalbuminemia  She has had a robust response to scheduled IV Lasix 40 mg twice daily with net -11 L this  hospitalization. Reported dry weight is 278 lbs, was 318 lb on admission. Currently 303lbs consistent with reported output -I think she needs IV Lasix since she's still very far from her dry weight and having cough and some associated dyspnea -Continue sodium/ fluid (1800 mL) restriction, daily weights -Continue spironolactone -encourage incentive spirometer use  Gastritis noted on EGD on 06/2020 without any varices -continue p.o. PPI, continue sucralfate  Cirrhosis, currently compensated.  Has history of hep C that has been treated.  Followed by GI who presumed this is more likely NAFLD/NASH cirrhosis -Continue follow-up with GI  COPD, stable.  No wheezing, no current O2 requirements -Continue home inhalers  Type 2 diabetes with hyperglycemia, poorly controlled.  A1c 7.1.  Fasting blood sugar 160-190 this a.m.  -Home medicines include Humulin 70/30 90 in a.m., 80 p.m.; Victoza; Metformin -Lantus 25 units twice daily, continue sliding scale with meals, monitor CBGs  Thrombocytopenia, stable.  In the setting of known chronic liver disease.  No signs or symptoms of bleeding -Monitor CBC  Chronic pain, stable -Continue methadone 50 mg daily -Patient reports taking higher dose as outpatient, will cooperate with her clinic ([facility] at [phone])  Chronic abdominal pain --PRN flexeril --bentyl  HTN, stable-continue lisinopril, Inderal  HLD, stable-continue Crestor  Mood disorder, stable-continue Seroquel, resume home effexor  Family Communication  : None  Code Status : Full  Disposition Plan  :  Patient is from home. Anticipated d/c date: 2 to 3 days. Barriers to d/c or necessity for inpatient status:  Still requiring IV antibiotics, still very far from dry weight and having cough/mild dyspnea still  requiring IV Lasix Consults  : None  Procedures  : None  DVT Prophylaxis  : SCDs  MDM: The below labs and imaging reports were reviewed and summarized above.   Medication management as above.  Lab Results  Component Value Date   PLT 133 (L) 08/15/2020    Diet :  Diet Order            Diet Carb Modified Fluid consistency: Thin; Room service appropriate? Yes; Fluid restriction: 1800 mL Fluid  Diet effective now                  Inpatient Medications Scheduled Meds: . aspirin EC  162 mg Oral Daily  . Chlorhexidine Gluconate Cloth  6 each Topical Daily  . docusate sodium  100 mg Oral QID  . furosemide  40 mg Intravenous Q12H  . insulin aspart  0-15 Units Subcutaneous TID WC  . insulin glargine  25 Units Subcutaneous BID  . lisinopril  5 mg Oral Daily  . methadone  15 mg Oral Daily  . pantoprazole  40 mg Oral BID  . propranolol  10 mg Oral TID  . psyllium  1 packet Oral BID  . QUEtiapine  25 mg Oral QHS  . rosuvastatin  5 mg Oral Daily  . senna  1 tablet Oral BID  . spironolactone  200 mg Oral Daily  . sucralfate  1 g Oral TID AC & HS  . venlafaxine XR  225 mg Oral Daily   Continuous Infusions: . sodium chloride    . ceFEPime (MAXIPIME) IV 2 g (08/15/20 1217)  . lactated ringers     PRN Meds:.sodium chloride, acetaminophen **OR** acetaminophen, cyclobenzaprine, dicyclomine, ipratropium-albuterol, [DISCONTINUED] ondansetron **OR** ondansetron (ZOFRAN) IV, ondansetron, oxyCODONE  Antibiotics  :   Anti-infectives (From admission, onward)   Start     Dose/Rate Route Frequency Ordered Stop   08/12/20 2000  ceFEPIme (MAXIPIME) 2 g in sodium chloride 0.9 % 100 mL IVPB        2 g 200 mL/hr over 30 Minutes Intravenous Every 8 hours 08/12/20 1220     08/12/20 2000  metroNIDAZOLE (FLAGYL) IVPB 500 mg  Status:  Discontinued        500 mg 100 mL/hr over 60 Minutes Intravenous Every 8 hours 08/12/20 1339 08/14/20 1628   08/12/20 1300  linezolid (ZYVOX) IVPB 600 mg  Status:  Discontinued        600 mg 300 mL/hr over 60 Minutes Intravenous Every 12 hours 08/12/20 1150 08/13/20 1622   08/12/20 1200  ceFEPIme (MAXIPIME) 2 g in sodium  chloride 0.9 % 100 mL IVPB        2 g 200 mL/hr over 30 Minutes Intravenous  Once 08/12/20 1150 08/12/20 1318   08/12/20 1200  metroNIDAZOLE (FLAGYL) IVPB 500 mg        500 mg 100 mL/hr over 60 Minutes Intravenous  Once 08/12/20 1150 08/12/20 1446       Objective   Vitals:   08/14/20 1443 08/14/20 2052 08/15/20 0500 08/15/20 0634  BP: 120/69 113/65  105/62  Pulse: 94 95  98  Resp: 16 17  16   Temp: 98.4 F (36.9 C) 100 F (37.8 C)  99 F (37.2 C)  TempSrc:  Oral  Oral  SpO2: 96% 96%  96%  Weight:   (!) 137.6 kg   Height:        SpO2: 96 % O2 Flow Rate (L/min): 2 L/min  Wt Readings from Last 3 Encounters:  08/15/20 (!) 137.6 kg  08/06/20 133.5 kg  07/31/20 129.8 kg     Intake/Output Summary (Last 24 hours) at 08/15/2020 1329 Last data filed at 08/15/2020 1235 Gross per 24 hour  Intake 2126 ml  Output 8350 ml  Net -6224 ml    Physical Exam:     Awake Alert, Oriented X 3, Normal affect No new F.N deficits,  [place].AT, Normal respiratory effort on room air, CTAB though limited by body habitus RRR,No Gallops,Rubs or new Murmurs,  +ve B.Sounds, Abd Soft, No tenderness, No rebound, guarding or rigidity. No Cyanosis, No new Rash or bruise     I have personally reviewed the following:   Data Reviewed:  CBC Recent Labs  Lab 08/12/20 1023 08/13/20 0538 08/14/20 0634 08/15/20 0829  WBC 6.3 4.3 5.8 6.7  HGB 11.3* 9.2* 11.6* 11.4*  HCT 35.1* 28.5* 35.8* 35.2*  PLT 108* 85* 86* 133*  MCV 90.2 89.3 88.4 88.0  MCH 29.0 28.8 28.6 28.5  MCHC 32.2 32.3 32.4 32.4  RDW 16.2* 15.9* 15.8* 15.9*  LYMPHSABS 0.5*  --   --  1.2  MONOABS 0.5  --   --  0.7  EOSABS 0.1  --   --  0.2  BASOSABS 0.0  --   --  0.1    Chemistries  Recent Labs  Lab 08/12/20 1023 08/13/20 0538 08/14/20 0634 08/15/20 0829  NA 136 137 135 135  K 4.1 3.7 3.5 3.2*  CL 104 103 93* 93*  CO2 23 25 28 31   GLUCOSE 56* 206* 311* 194*  BUN 15 12 13 18   CREATININE 0.56 0.55 0.72 0.78    CALCIUM 8.7* 7.8* 8.3* 8.4*  MG  --   --  1.8  --   AST 48* 33  --   --   ALT 44 38  --   --   ALKPHOS 90 72  --   --   BILITOT 1.1 1.0  --   --    ------------------------------------------------------------------------------------------------------------------ No results for input(s): CHOL, HDL, LDLCALC, TRIG, CHOLHDL, LDLDIRECT in the last 72 hours.  Lab Results  Component Value Date   HGBA1C 7.1 (H) 08/13/2020   ------------------------------------------------------------------------------------------------------------------ No results for input(s): TSH, T4TOTAL, T3FREE, THYROIDAB in the last 72 hours.  Invalid input(s): FREET3 ------------------------------------------------------------------------------------------------------------------ No results for input(s): VITAMINB12, FOLATE, FERRITIN, TIBC, IRON, RETICCTPCT in the last 72 hours.  Coagulation profile Recent Labs  Lab 08/13/20 0538  INR 1.2    No results for input(s): DDIMER in the last 72 hours.  Cardiac Enzymes No results for input(s): CKMB, TROPONINI, MYOGLOBIN in the last 168 hours.  Invalid input(s): CK ------------------------------------------------------------------------------------------------------------------    Component Value Date/Time   BNP 26.0 07/29/2020 1301    Micro Results Recent Results (from the past 240 hour(s))  Respiratory Panel by RT PCR (Flu A&B, Covid) - Nasopharyngeal Swab     Status: None   Collection Time: 08/12/20 10:23 AM   Specimen: Nasopharyngeal Swab  Result Value Ref Range Status   SARS Coronavirus 2 by RT PCR NEGATIVE NEGATIVE Final    Comment: (NOTE) SARS-CoV-2 target nucleic acids are NOT DETECTED.  The SARS-CoV-2 RNA is generally detectable in upper respiratoy specimens during the acute phase of infection. The lowest concentration of SARS-CoV-2 viral copies this assay can detect is 131 copies/mL. A negative result does not preclude SARS-Cov-2 infection and  should not be used as the sole basis for treatment or other patient management decisions. A negative result may occur with  improper specimen collection/handling,  submission of specimen other than nasopharyngeal swab, presence of viral mutation(s) within the areas targeted by this assay, and inadequate number of viral copies (<131 copies/mL). A negative result must be combined with clinical observations, patient history, and epidemiological information. The expected result is Negative.  Fact Sheet for Patients:  PinkCheek.be  Fact Sheet for Healthcare Providers:  GravelBags.it  This test is no t yet approved or cleared by the [place] FDA and  has been authorized for detection and/or diagnosis of SARS-CoV-2 by FDA under an Emergency Use Authorization (EUA). This EUA will remain  in effect (meaning this test can be used) for the duration of the COVID-19 declaration under Section 564(b)(1) of the Act, 21 U.S.C. section 360bbb-3(b)(1), unless the authorization is terminated or revoked sooner.     Influenza A by PCR NEGATIVE NEGATIVE Final   Influenza B by PCR NEGATIVE NEGATIVE Final    Comment: (NOTE) The Xpert Xpress SARS-CoV-2/FLU/RSV assay is intended as an aid in  the diagnosis of influenza from Nasopharyngeal swab specimens and  should not be used as a sole basis for treatment. Nasal washings and  aspirates are unacceptable for Xpert Xpress SARS-CoV-2/FLU/RSV  testing.  Fact Sheet for Patients: PinkCheek.be  Fact Sheet for Healthcare Providers: GravelBags.it  This test is not yet approved or cleared by the [place] FDA and  has been authorized for detection and/or diagnosis of SARS-CoV-2 by  FDA under an Emergency Use Authorization (EUA). This EUA will remain  in effect (meaning this test can be used) for the duration of the  Covid-19 declaration  under Section 564(b)(1) of the Act, 21  U.S.C. section 360bbb-3(b)(1), unless the authorization is  terminated or revoked. Performed at [facility], [address] [place], [place] [address]   Blood Culture (routine x 2)     Status: None (Preliminary result)   Collection Time: 08/12/20 12:36 PM   Specimen: BLOOD  Result Value Ref Range Status   Specimen Description BLOOD  Final   Special Requests NONE  Final   Culture   Final    NO GROWTH 3 DAYS Performed at [facility], [address], [place], [place] [address]    Report Status PENDING  Incomplete  Urine culture     Status: None   Collection Time: 08/12/20 12:40 PM   Specimen: Urine, Clean Catch  Result Value Ref Range Status   Specimen Description   Final    URINE, CLEAN CATCH Performed at [facility], [address], [place], [place] [address]    Special Requests   Final    NONE Performed at [facility], [address], [place], [place] [address]    Culture   Final    NO GROWTH Performed at [facility], [place] [address] [place], [place] [address]    Report Status 08/13/2020 FINAL  Final  Blood Culture (routine x 2)     Status: None (Preliminary result)   Collection Time: 08/12/20 12:43 PM   Specimen: BLOOD  Result Value Ref Range Status   Specimen Description BLOOD  Final   Special Requests NONE  Final   Culture   Final    NO GROWTH 3 DAYS Performed at [facility], [address], [place], [place] [address]    Report Status PENDING  Incomplete  MRSA PCR Screening     Status: None   Collection Time: 08/13/20  1:53 PM   Specimen: Nasal Mucosa; Nasopharyngeal  Result Value Ref Range Status   MRSA by PCR  NEGATIVE NEGATIVE Final    Comment:        The GeneXpert MRSA Assay (FDA approved for NASAL specimens only), is one component of a comprehensive MRSA colonization surveillance program. It is not intended to diagnose MRSA infection nor to guide or monitor treatment for MRSA infections. Performed at  [facility], [address] [place], [place] [address]     Radiology Reports DG Chest 2 View  Result Date: 07/29/2020 CLINICAL DATA:  Shortness of breath, chest pain EXAM: CHEST - 2 VIEW COMPARISON:  05/26/2019 FINDINGS: The heart size and mediastinal contours are within normal limits. Slightly low lung volumes with mild bibasilar atelectasis. No focal airspace consolidation, pleural effusion, or pneumothorax. The visualized skeletal structures are unremarkable. IMPRESSION: Slightly low lung volumes with mild bibasilar atelectasis. Otherwise, no acute cardiopulmonary findings. Electronically Signed   By: Davina Poke D.O.   On: 07/29/2020 11:55   CT Chest Wo Contrast  Result Date: 07/29/2020 CLINICAL DATA:  Shortness of breath and chest pain. EXAM: CT CHEST WITHOUT CONTRAST TECHNIQUE: Multidetector CT imaging of the chest was performed following the standard protocol without IV contrast. COMPARISON:  July 17, 2016 FINDINGS: Cardiovascular: No significant vascular findings. Normal heart size. No pericardial effusion. Mediastinum/Nodes: No enlarged mediastinal or axillary lymph nodes. Thyroid gland, trachea, and esophagus demonstrate no significant findings. Lungs/Pleura: A mild amount of atelectasis is seen along the inferior aspect of the left upper lobe. There is no evidence of a pleural effusion or pneumothorax. Upper Abdomen: Multiple surgical clips are seen within the gallbladder fossa. The liver is moderately enlarged. The spleen is markedly enlarged. Musculoskeletal: No chest wall mass or suspicious bone lesions identified. IMPRESSION: 1. Mild inferior left upper lobe atelectasis. 2. Hepatosplenomegaly. 3. Evidence of prior cholecystectomy. Electronically Signed   By: Virgina Norfolk M.D.   On: 07/29/2020 15:46   NM Pulmonary Perfusion  Result Date: 08/12/2020 CLINICAL DATA:  34 year old female with positive D-dimer. EXAM: NUCLEAR MEDICINE PERFUSION LUNG SCAN TECHNIQUE: Perfusion  images were obtained in multiple projections after intravenous injection of radiopharmaceutical. Ventilation scans intentionally deferred if perfusion scan and chest x-ray adequate for interpretation during COVID 19 epidemic. RADIOPHARMACEUTICALS:  4.4 mCi Tc-48mMAA IV COMPARISON:  Chest radiograph dated 08/12/2020. FINDINGS: There is homogeneous perfusion.  No perfusion defect identified. IMPRESSION: Normal perfusion scan. Electronically Signed   By: AAnner CreteM.D.   On: 08/12/2020 16:11   NM Pulmonary Perfusion  Result Date: 07/30/2020 CLINICAL DATA:  PE suspected, high prob Chest pain and shortness of breath. EXAM: NUCLEAR MEDICINE PERFUSION LUNG SCAN TECHNIQUE: Perfusion images were obtained in multiple projections after intravenous injection of radiopharmaceutical. Ventilation scans intentionally deferred if perfusion scan and chest x-ray adequate for interpretation during COVID 19 epidemic. RADIOPHARMACEUTICALS:  4.1 mCi Tc-933mAA IV COMPARISON:  Chest radiograph and CT yesterday. FINDINGS: There is a homogeneous distribution of radiotracer throughout both lungs. No evidence of perfusion defects. IMPRESSION: No evidence of pulmonary embolus.  Normal perfusion exam. Electronically Signed   By: MeKeith Rake.D.   On: 07/30/2020 18:50   [place] Img Lower Bilateral (DVT)  Result Date: 07/30/2020 CLINICAL DATA:  Lower extremity edema EXAM: BILATERAL LOWER EXTREMITY VENOUS DOPPLER ULTRASOUND TECHNIQUE: Gray-scale sonography with graded compression, as well as color Doppler and duplex ultrasound were performed to evaluate the lower extremity deep venous systems from the level of the common femoral vein and including the common femoral, femoral, profunda femoral, popliteal and calf veins including the posterior tibial, peroneal and gastrocnemius veins when  visible. The superficial great saphenous vein was also interrogated. Spectral Doppler was utilized to evaluate flow at rest and with distal  augmentation maneuvers in the common femoral, femoral and popliteal veins. COMPARISON:  None. FINDINGS: RIGHT LOWER EXTREMITY Common Femoral Vein: No evidence of thrombus. Normal compressibility, respiratory phasicity and response to augmentation. Saphenofemoral Junction: No evidence of thrombus. Normal compressibility and flow on color Doppler imaging. Profunda Femoral Vein: No evidence of thrombus. Normal compressibility and flow on color Doppler imaging. Femoral Vein: No evidence of thrombus. Normal compressibility, respiratory phasicity and response to augmentation. Popliteal Vein: No evidence of thrombus. Normal compressibility, respiratory phasicity and response to augmentation. Calf Veins: No evidence of thrombus. Normal compressibility and flow on color Doppler imaging. LEFT LOWER EXTREMITY Common Femoral Vein: No evidence of thrombus. Normal compressibility, respiratory phasicity and response to augmentation. Saphenofemoral Junction: No evidence of thrombus. Normal compressibility and flow on color Doppler imaging. Profunda Femoral Vein: No evidence of thrombus. Normal compressibility and flow on color Doppler imaging. Femoral Vein: No evidence of thrombus. Normal compressibility, respiratory phasicity and response to augmentation. Popliteal Vein: No evidence of thrombus. Normal compressibility, respiratory phasicity and response to augmentation. Calf Veins: No evidence of thrombus. Normal compressibility and flow on color Doppler imaging. IMPRESSION: Limited exam because of body habitus. No evidence of significant deep venous thrombosis in either lower extremity. Electronically Signed   By: Jerilynn Mages.  Shick M.D.   On: 07/30/2020 15:44   DG Chest Port 1 View  Result Date: 08/12/2020 CLINICAL DATA:  Shortness of breath EXAM: PORTABLE CHEST 1 VIEW COMPARISON:  July 29, 2020 chest radiograph and chest CT FINDINGS: There is bibasilar atelectasis. No edema or airspace opacity. Heart is mildly enlarged with  pulmonary vascularity normal. No adenopathy. No bone lesions. IMPRESSION: Mild bibasilar atelectasis. Lungs otherwise clear. Mild cardiac enlargement. No adenopathy. Electronically Signed   By: Lowella Grip III M.D.   On: 08/12/2020 10:54   ECHOCARDIOGRAM COMPLETE  Result Date: 07/31/2020    ECHOCARDIOGRAM REPORT   Patient Name:   LEALER MARSLAND Date of Exam: 07/31/2020 Medical Rec #:  [id number]        Height:       69.0 in Accession #:    [id number]       Weight:       302.0 lb Date of Birth:  1986-04-22         BSA:          2.462 m Patient Age:    76 years         BP:           140/88 mmHg Patient Gender: F                HR:           108 bpm. Exam Location:  Inpatient Procedure: 2D Echo, Cardiac Doppler and Color Doppler Indications:    CHF-Acute Diastolic 937.90 / W40.97  History:        Patient has prior history of Echocardiogram examinations, most                 recent 07/05/2019. COPD; Risk Factors:Diabetes.  Sonographer:    Bernadene Person RDCS Referring Phys: [id number] AMRIT ADHIKARI  Sonographer Comments: Technically difficult study due to poor echo windows. IMPRESSIONS  1. Left ventricular ejection fraction, by estimation, is 55 to 60%. The left ventricle has normal function. Left ventricular endocardial border not optimally defined to evaluate regional wall motion. Left ventricular diastolic parameters are  indeterminate.  2. Right ventricular systolic function is normal. The right ventricular size is normal.  3. The mitral valve is grossly normal. Trivial mitral valve regurgitation.  4. The aortic valve was not well visualized. Aortic valve regurgitation is not visualized.  5. Unable to estimate CVP. FINDINGS  Left Ventricle: Left ventricular ejection fraction, by estimation, is 55 to 60%. The left ventricle has normal function. Left ventricular endocardial border not optimally defined to evaluate regional wall motion. The left ventricular internal cavity size was normal in size. There is no left  ventricular hypertrophy. Left ventricular diastolic parameters are indeterminate. Right Ventricle: The right ventricular size is normal. No increase in right ventricular wall thickness. Right ventricular systolic function is normal. Left Atrium: Left atrial size was normal in size. Right Atrium: Right atrial size was normal in size. Pericardium: There is no evidence of pericardial effusion. Presence of pericardial fat pad. Mitral Valve: The mitral valve is grossly normal. Trivial mitral valve regurgitation. Tricuspid Valve: The tricuspid valve is grossly normal. Tricuspid valve regurgitation is trivial. Aortic Valve: The aortic valve was not well visualized. Aortic valve regurgitation is not visualized. Pulmonic Valve: The pulmonic valve was not well visualized. Pulmonic valve regurgitation is not visualized. Aorta: The aortic root is normal in size and structure. Venous: Unable to estimate CVP. The inferior vena cava was not well visualized. IAS/Shunts: The interatrial septum was not well visualized.  LEFT VENTRICLE PLAX 2D LVIDd:         5.74 cm  Diastology LVIDs:         3.97 cm  LV e' medial:    8.70 cm/s LV PW:         0.82 cm  LV E/e' medial:  9.6 LV IVS:        0.78 cm  LV e' lateral:   15.20 cm/s LVOT diam:     1.90 cm  LV E/e' lateral: 5.5 LV SV:         79 LV SV Index:   32 LVOT Area:     2.84 cm  RIGHT VENTRICLE RV S prime:     10.80 cm/s TAPSE (M-mode): 2.8 cm LEFT ATRIUM             Index       RIGHT ATRIUM           Index LA diam:        3.80 cm 1.54 cm/m  RA Area:     10.90 cm LA Vol (A2C):   26.9 ml 10.93 ml/m RA Volume:   21.40 ml  8.69 ml/m LA Vol (A4C):   46.4 ml 18.85 ml/m LA Biplane Vol: 36.8 ml 14.95 ml/m  AORTIC VALVE LVOT Vmax:   155.00 cm/s LVOT Vmean:  117.000 cm/s LVOT VTI:    0.278 m  AORTA Ao Root diam: 3.10 cm MITRAL VALVE               TRICUSPID VALVE MV Area (PHT): 4.29 cm    TR Peak grad:   11.2 mmHg MV Decel Time: 177 msec    TR Vmax:        167.00 cm/s MV E velocity: 83.90  cm/s MV A velocity: 85.90 cm/s  SHUNTS MV E/A ratio:  0.98        Systemic VTI:  0.28 m                            Systemic Diam: 1.90  cm Rozann Lesches MD Electronically signed by Rozann Lesches MD Signature Date/Time: 07/31/2020/11:51:14 AM    Final    [place] ASCITES (ABDOMEN LIMITED)  Result Date: 07/30/2020 CLINICAL DATA:  Cirrhosis, ascites EXAM: LIMITED ABDOMEN ULTRASOUND FOR ASCITES TECHNIQUE: Limited ultrasound survey for ascites was performed in all four abdominal quadrants. COMPARISON:  07/29/2020 FINDINGS: Trace ascites. Insufficient ascites for paracentesis. IMPRESSION: Trace ascites, insufficient for paracentesis. Electronically Signed   By: Lavonia Dana M.D.   On: 07/30/2020 10:37   [place] ASCITES (ABDOMEN LIMITED)  Result Date: 07/29/2020 CLINICAL DATA:  Alcoholic cirrhosis, abdominal swelling EXAM: LIMITED ABDOMEN ULTRASOUND FOR ASCITES TECHNIQUE: Limited ultrasound survey for ascites was performed in all four abdominal quadrants. COMPARISON:  CT abdomen and pelvis 12/04/2019 FINDINGS: Minimal ascites identified. Insufficient ascites for paracentesis. IMPRESSION: Insufficient ascites for paracentesis. Electronically Signed   By: Lavonia Dana M.D.   On: 07/29/2020 16:57     Time Spent in minutes  30     Desiree Hane M.D on 08/15/2020 at 1:29 PM  To page go to [email or web address] - password [facility]

## 2020-08-15 NOTE — Progress Notes (Signed)
Pharmacy Antibiotic Note  Jaime [name] is a 34 y.o. female admitted on 08/12/2020 with infection of unknown source.  Pharmacy has been consulted for cefepime dosing.    Plan: Continue cefepime IV q8h    Pharmacy will continue to monitor renal function, cultures and patient progress.  Height: 5' 9"  (175.3 cm) Weight: (!) 137.6 kg (303 lb 5.7 oz) IBW/kg (Calculated) : 66.2  Temp (24hrs), Avg:99.1 F (37.3 C), Min:98.4 F (36.9 C), Max:100 F (37.8 C)  Recent Labs  Lab 08/12/20 1023 08/12/20 1246 08/13/20 0538 08/14/20 0634 08/15/20 0829  WBC 6.3  --  4.3 5.8 6.7  CREATININE 0.56  --  0.55 0.72 0.78  LATICACIDVEN 3.1* 2.4*  --   --   --     Estimated Creatinine Clearance: 145.5 mL/min (by C-G formula based on SCr of 0.78 mg/dL).    Allergies  Allergen Reactions  . Iodine-131 Anaphylaxis, Shortness Of Breath and Swelling  . Ivp Dye [Iodinated Diagnostic Agents] Anaphylaxis    Skin gets very red, unable to walk   . Ketorolac Tromethamine Shortness Of Breath  . Tylenol [Acetaminophen] Other (See Comments)    Due to cirrhosis   . Gabapentin   . Morphine And Related     Pt says she is not allergic to Morphine 08/12/20  . Nsaids Other (See Comments)    Flares ulcers  . Suboxone [Buprenorphine Hcl-Naloxone Hcl]   . Vancomycin Swelling    Facial swelling,     Antimicrobials this admission: cefepime 10/11 >>  metronidazole 10/11 >>  10/12 linezolid 10/11>>10/12   Microbiology results: 10/11 BC x2:  ngtd 10/11 UCx:  no growth 10/12 MRSA PCR is negative    Thank you for allowing pharmacy to be a part of this patient's care.  Ramond Craver 08/15/2020 2:05 PM

## 2020-08-15 NOTE — Evaluation (Signed)
Physical Therapy Evaluation Patient Details Name: Jaime [name] MRN: [id number] DOB: 1986/08/27 Today's Date: 08/15/2020   History of Present Illness  Jaime [name]  is a 34 y.o. 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  Patient was seen at outside hospital and was given Lasix for diuresis.  At that time she also had VQ scan which was negative for pulmonary embolism.  Patient says that she has continued to gain weight since she was discharged from the hospital.  Usually she weighs around 280 pounds, and has gained more than 30 pounds.  She was told by her PCP to go to ED for CT chest without contrast evaluation to rule out PE.  CT chest showed no significant abnormality.    Clinical Impression  Patient functioning near baseline for functional mobility and gait, required HOB raised for supine to sitting, otherwise demonstrates good return for transfers, ambulation in hallway without use of an AD and no loss of balance.  Patient encouraged to ambulate as tolerated while in hospital.  Plan:  Patient discharged from physical therapy to care of nursing for ambulation daily as tolerated for length of stay.     Follow Up Recommendations No PT follow up    Equipment Recommendations  None recommended by PT    Recommendations for Other Services       Precautions / Restrictions Precautions Precautions: None Restrictions Weight Bearing Restrictions: No      Mobility  Bed Mobility Overal bed mobility: Modified Independent             General bed mobility comments: HOB raised  Transfers Overall transfer level: Modified independent               General transfer comment: slightly increased time  Ambulation/Gait Ambulation/Gait assistance: Modified independent  (Device/Increase time) Gait Distance (Feet): 120 Feet   Gait Pattern/deviations: WFL(Within Functional Limits) Gait velocity: decreased   General Gait Details: grossly WFL, demonstrates good return for ambulation in room, hallways without loss of balance  Stairs            Wheelchair Mobility    Modified Rankin (Stroke Patients Only)       Balance Overall balance assessment: No apparent balance deficits (not formally assessed)                                           Pertinent Vitals/Pain Pain Assessment: No/denies pain    Home Living Family/patient expects to be discharged to:: Private residence Living Arrangements: Parent Available Help at Discharge: Family;Available 24 hours/day Type of Home: House Home Access: Stairs to enter Entrance Stairs-Rails: Right;Left;Can reach both Entrance Stairs-Number of Steps: 2 Home Layout: One level Home Equipment: Walker - 2 wheels;Cane - single point      Prior Function Level of Independence: Independent         Comments: household and short distanced community ambulator without AD, drives     Hand Dominance        Extremity/Trunk Assessment   Upper Extremity Assessment Upper Extremity Assessment: Overall WFL for tasks assessed    Lower Extremity Assessment Lower Extremity Assessment: Overall WFL for tasks assessed    Cervical / Trunk Assessment Cervical / Trunk Assessment: Normal  Communication  Communication: No difficulties  Cognition Arousal/Alertness: Awake/alert Behavior During Therapy: WFL for tasks assessed/performed Overall Cognitive Status: Within Functional Limits for tasks assessed                                        General Comments      Exercises     Assessment/Plan    PT Assessment Patent does not need any further PT services  PT Problem List         PT Treatment Interventions      PT Goals (Current goals can be found in the Care Plan  section)  Acute Rehab PT Goals Patient Stated Goal: return home with family to assist PT Goal Formulation: With patient Time For Goal Achievement: 08/15/20 Potential to Achieve Goals: Good    Frequency     Barriers to discharge        Co-evaluation               AM-PAC PT "6 Clicks" Mobility  Outcome Measure Help needed turning from your back to your side while in a flat bed without using bedrails?: None Help needed moving from lying on your back to sitting on the side of a flat bed without using bedrails?: None Help needed moving to and from a bed to a chair (including a wheelchair)?: None Help needed standing up from a chair using your arms (e.g., wheelchair or bedside chair)?: None Help needed to walk in hospital room?: None Help needed climbing 3-5 steps with a railing? : None 6 Click Score: 24    End of Session   Activity Tolerance: Patient tolerated treatment well Patient left: in chair;with call bell/phone within reach Nurse Communication: Mobility status PT Visit Diagnosis: Unsteadiness on feet (R26.81);Other abnormalities of gait and mobility (R26.89);Muscle weakness (generalized) (M62.81)    Time: 1120-1140 PT Time Calculation (min) (ACUTE ONLY): 20 min   Charges:   PT Evaluation $PT Eval Moderate Complexity: 1 Mod PT Treatments $Therapeutic Activity: 8-22 mins        11:53 AM, 08/15/20 Lonell Grandchild, MPT Physical Therapist with [facility] [phone] office [phone] mobile phone

## 2020-08-16 ENCOUNTER — Inpatient Hospital Stay (HOSPITAL_COMMUNITY): Payer: Medicaid Other

## 2020-08-16 DIAGNOSIS — E871 Hypo-osmolality and hyponatremia: Secondary | ICD-10-CM

## 2020-08-16 DIAGNOSIS — E119 Type 2 diabetes mellitus without complications: Secondary | ICD-10-CM | POA: Diagnosis not present

## 2020-08-16 DIAGNOSIS — F1120 Opioid dependence, uncomplicated: Secondary | ICD-10-CM | POA: Diagnosis not present

## 2020-08-16 DIAGNOSIS — K7460 Unspecified cirrhosis of liver: Secondary | ICD-10-CM | POA: Diagnosis not present

## 2020-08-16 LAB — CBC WITH DIFFERENTIAL/PLATELET
Abs Immature Granulocytes: 0.34 10*3/uL — ABNORMAL HIGH (ref 0.00–0.07)
Basophils Absolute: 0.1 10*3/uL (ref 0.0–0.1)
Basophils Relative: 1 %
Eosinophils Absolute: 0.3 10*3/uL (ref 0.0–0.5)
Eosinophils Relative: 3 %
HCT: 38.8 % (ref 36.0–46.0)
Hemoglobin: 12.8 g/dL (ref 12.0–15.0)
Immature Granulocytes: 4 %
Lymphocytes Relative: 16 %
Lymphs Abs: 1.3 10*3/uL (ref 0.7–4.0)
MCH: 28.3 pg (ref 26.0–34.0)
MCHC: 33 g/dL (ref 30.0–36.0)
MCV: 85.7 fL (ref 80.0–100.0)
Monocytes Absolute: 1 10*3/uL (ref 0.1–1.0)
Monocytes Relative: 11 %
Neutro Abs: 5.5 10*3/uL (ref 1.7–7.7)
Neutrophils Relative %: 65 %
Platelets: 145 10*3/uL — ABNORMAL LOW (ref 150–400)
RBC: 4.53 MIL/uL (ref 3.87–5.11)
RDW: 16 % — ABNORMAL HIGH (ref 11.5–15.5)
WBC: 8.5 10*3/uL (ref 4.0–10.5)
nRBC: 0 % (ref 0.0–0.2)

## 2020-08-16 LAB — BASIC METABOLIC PANEL
Anion gap: 11 (ref 5–15)
BUN: 18 mg/dL (ref 6–20)
CO2: 28 mmol/L (ref 22–32)
Calcium: 8.8 mg/dL — ABNORMAL LOW (ref 8.9–10.3)
Chloride: 91 mmol/L — ABNORMAL LOW (ref 98–111)
Creatinine, Ser: 0.62 mg/dL (ref 0.44–1.00)
GFR, Estimated: >60 mL/min (ref >60)
Glucose, Bld: 264 mg/dL — ABNORMAL HIGH (ref 70–99)
Potassium: 3.5 mmol/L (ref 3.5–5.1)
Sodium: 130 mmol/L — ABNORMAL LOW (ref 135–145)

## 2020-08-16 LAB — GLUCOSE, CAPILLARY
Glucose-Capillary: 272 mg/dL — ABNORMAL HIGH (ref 70–99)
Glucose-Capillary: 300 mg/dL — ABNORMAL HIGH (ref 70–99)
Glucose-Capillary: 340 mg/dL — ABNORMAL HIGH (ref 70–99)
Glucose-Capillary: 379 mg/dL — ABNORMAL HIGH (ref 70–99)

## 2020-08-16 LAB — MAGNESIUM: Magnesium: 2 mg/dL (ref 1.7–2.4)

## 2020-08-16 MED ORDER — INSULIN ASPART 100 UNIT/ML ~~LOC~~ SOLN
0.0000 [IU] | Freq: Every day | SUBCUTANEOUS | Status: DC
  Administered 2020-08-16: 5 [IU] via SUBCUTANEOUS

## 2020-08-16 MED ORDER — AZITHROMYCIN 250 MG PO TABS
250.0000 mg | ORAL_TABLET | Freq: Every day | ORAL | Status: DC
  Administered 2020-08-17: 250 mg via ORAL
  Filled 2020-08-16: qty 1

## 2020-08-16 MED ORDER — METHADONE HCL 10 MG PO TABS
10.0000 mg | ORAL_TABLET | Freq: Once | ORAL | Status: AC
  Administered 2020-08-16: 10 mg via ORAL
  Filled 2020-08-16: qty 1

## 2020-08-16 MED ORDER — AZITHROMYCIN 250 MG PO TABS
500.0000 mg | ORAL_TABLET | Freq: Once | ORAL | Status: AC
  Administered 2020-08-16: 500 mg via ORAL
  Filled 2020-08-16: qty 2

## 2020-08-16 MED ORDER — POTASSIUM CHLORIDE 20 MEQ PO PACK: 40.0000 meq | PACK | Freq: Two times a day (BID) | ORAL | Status: DC

## 2020-08-16 MED ORDER — METHADONE HCL 10 MG PO TABS
25.0000 mg | ORAL_TABLET | Freq: Every day | ORAL | Status: DC
  Administered 2020-08-17: 25 mg via ORAL
  Filled 2020-08-16: qty 3

## 2020-08-16 MED ORDER — INSULIN ASPART 100 UNIT/ML ~~LOC~~ SOLN
5.0000 [IU] | Freq: Three times a day (TID) | SUBCUTANEOUS | Status: DC
  Administered 2020-08-16 – 2020-08-17 (×3): 5 [IU] via SUBCUTANEOUS

## 2020-08-16 MED ORDER — POTASSIUM CHLORIDE CRYS ER 20 MEQ PO TBCR
40.0000 meq | EXTENDED_RELEASE_TABLET | Freq: Two times a day (BID) | ORAL | Status: AC
  Administered 2020-08-16 (×2): 40 meq via ORAL
  Filled 2020-08-16 (×2): qty 2

## 2020-08-16 NOTE — Progress Notes (Signed)
Inpatient Diabetes Program Recommendations  AACE/ADA: New Consensus Statement on Inpatient Glycemic Control   Target Ranges:  Prepandial:   less than 140 mg/dL      Peak postprandial:   less than 180 mg/dL (1-2 hours)      Critically ill patients:  140 - 180 mg/dL   Results for Jaime [name], Jaime [name] (MRN [id number]) as of 08/16/2020 09:29  Ref. Range 08/15/2020 07:44 08/15/2020 11:32 08/15/2020 17:00 08/15/2020 21:09 08/16/2020 08:19  Glucose-Capillary Latest Ref Range: 70 - 99 mg/dL 161 (H) 245 (H) 323 (H) 395 (H) 272 (H)   Review of Glycemic Control  Diabetes history: DM 2, Sees Rayetta Pigg, NP with Endocrinology established on 07/22/2020 Outpatient Diabetes medications: 70/30 90 units QAM, 80 units QPM, Metformin 1000 mg BID, Victoza 1.8 mg Daily Insulin Diabetes medications: Lantus 25 units BID, Novolog 0-15 units TID with meals  Inpatient Diabetes Program Recommendations:    Insulin: Please consider ordering Novolog 0-5 units QHS for bedtime correction and ordering Novolog 5 units TID with meals for meal coverage if patient eats at least 50% of meals.  Thanks, Barnie Alderman, RN, MSN, CDE Diabetes Coordinator Inpatient Diabetes Program [phone] (Team Pager from 8am to 5pm)

## 2020-08-16 NOTE — Progress Notes (Addendum)
[facility]  PROGRESS NOTE  Jaime [name] YHC:[id number] DOB: [birth year] DOA: 08/12/2020 PCP: Neale Burly, MD Admit date - 08/12/2020   Admitting Physician Shawna Clamp, MD  Outpatient Primary MD for the patient is Neale Burly, MD  LOS - 4 Brief Narrative   Jaime [name] is a 34 y.o. year old female with Medical history significant for NAFLD/NASH cirrhosis, prior history of IV drug use and hep C  (status post treatment, abstained for the past 10 years) class III obesity, type 2 diabetes, chronic pain on methadone,  who presented on 08/12/2020 with shortness of breath and 20 pound weight gain in the past 2 weeks and was found to have fever, tachycardia, tachypnea, hypoxia and lactic acidosis concerning for severe sepsis of unclear etiology based off work-up.  Initially required linezolid, cefepime, metronidazole.  Linezolid discontinued after MRSA PCR screen negative.  Found to have elevated D-dimer however underwent VQ scan which showed low probability for PE (has contrast allergy unable to do CTA chest).  During hospital course patient has been continued on IV antibiotics as well as IV Lasix for volume overload.     Subjective  Feels much better today, reports cough is improved.  A & P   Severe sepsis presumed secondary to right lower lobe pneumonia. Sepsis physiology is now resolved. No overt fevers, tmax of 100 and reported worsening cough Blood cultures unremarkable to date.  Repeat chest x-ray shows concerning right lower lobe pneumonia this is likely nidus etiology of sepsis (admitting chest x-ray was unremarkable) -Transition from IV cefepime to oral ciprofloxacin (Pseudomonas is a risk factor given reported hx ofCOPD), -Add on azithromycin given reports of cough yesterday and no coverage for atypical bacteria -Discontinued IV Flagyl, 10/13 -Continue to monitor -Encourage incentive spirometry  Volume overload likely multifactorial. Biggest driver could be  cirrhosis? In review of recent TTE she has no noted diastolic dysfunction; also has hypoalbuminemia  She has had a robust response to scheduled IV Lasix 40 mg twice daily with net - 22 L this hospitalization. Reported dry weight is 278 lbs, was 318 lb on admission.  303 pounds on 10/14.  With 5.7 L out in the past 24 hours given her hyponatremia and significant volume output during this hospitalization I think she needs diuresis holiday -I will stop IV Lasix given hyponatremia and significant output for diuresis holiday and likely resume oral Lasix on 10/16 -Continue sodium/ fluid (1800 mL) restriction, daily weights, continue to monitor BMP -Continue spironolactone -encourage incentive spirometer use  Hyponatremia, mild.  Sodium currently 130 in the setting of aggressive IV diuresis.  Asymptomatic. -Hold off on IV diuresis today -Repeat BMP in a.m. if stable/improved should be able to tolerate resuming oral Lasix  Gastritis noted on EGD on 06/2020 without any varices -continue p.o. PPI, continue sucralfate  Cirrhosis, currently compensated.  Has history of hep C that has been treated.  Followed by GI who presumed this is more likely NAFLD/NASH cirrhosis -Continue follow-up with GI  COPD, stable.  No wheezing, no current O2 requirements -Continue home inhalers  Type 2 diabetes with hyperglycemia, poorly controlled.  A1c 7.1.  Fasting blood sugar 270s this am -Home medicines include Humulin 70/30 90 in a.m., 80 p.m.; Victoza; Metformin -Lantus 25 units twice daily, continue sliding scale with meals, monitor CBGs -NovoLog 0 to 5 units nightly for bedtime correction, NovoLog 5 units 3 times daily with meals for meal coverage if patient eating at least 50% of meals  Thrombocytopenia, stable.  In the  setting of known chronic liver disease.  No signs or symptoms of bleeding -Monitor CBC  Chronic pain, stable -Continue home methadone at 25 mg daily, confirmed with her outpatient treatment clinic  -([facility] at [phone])  Chronic abdominal pain --PRN flexeril --bentyl  HTN, stable-continue lisinopril, Inderal  HLD, stable-continue Crestor  Mood disorder, stable-continue Seroquel, resume home effexor  Obesity, class III.  Patient states she has had talks with her endocrinologist about focusing more on lifestyle modification/diet changes with the dietitian to help and overall weight loss.  We had further conversations of counseling about weight loss and diet as well as possible medications that can assist during our encounter today -Continue outpatient support  Family Communication  : None  Code Status : Full  Disposition Plan  :  Patient is from home. Anticipated d/c date: 2 to 3 days. Barriers to d/c or necessity for inpatient status:  Monitoring sodium while on diuresis holiday consults  : None  Procedures  : None  DVT Prophylaxis  : SCDs  MDM: The below labs and imaging reports were reviewed and summarized above.  Medication management as above.  Lab Results  Component Value Date   PLT 145 (L) 08/16/2020    Diet :  Diet Order            Diet Carb Modified Fluid consistency: Thin; Room service appropriate? Yes  Diet effective now                  Inpatient Medications Scheduled Meds: . aspirin EC  162 mg Oral Daily  . [START ON 08/17/2020] azithromycin  250 mg Oral Daily  . Chlorhexidine Gluconate Cloth  6 each Topical Daily  . docusate sodium  100 mg Oral QID  . insulin aspart  0-15 Units Subcutaneous TID WC  . insulin glargine  25 Units Subcutaneous BID  . lisinopril  5 mg Oral Daily  . [START ON 08/17/2020] methadone  25 mg Oral Daily  . pantoprazole  40 mg Oral BID  . potassium chloride  40 mEq Oral BID  . propranolol  10 mg Oral TID  . psyllium  1 packet Oral BID  . QUEtiapine  25 mg Oral QHS  . rosuvastatin  5 mg Oral Daily  . senna  1 tablet Oral BID  . spironolactone  200 mg Oral Daily  . sucralfate  1 g Oral TID AC &  HS  . venlafaxine XR  225 mg Oral Daily   Continuous Infusions: . sodium chloride    . ceFEPime (MAXIPIME) IV 2 g (08/16/20 1118)  . lactated ringers     PRN Meds:.sodium chloride, acetaminophen **OR** acetaminophen, cyclobenzaprine, dicyclomine, ipratropium-albuterol, [DISCONTINUED] ondansetron **OR** ondansetron (ZOFRAN) IV, ondansetron, oxyCODONE  Antibiotics  :   Anti-infectives (From admission, onward)   Start     Dose/Rate Route Frequency Ordered Stop   08/17/20 1000  azithromycin (ZITHROMAX) tablet 250 mg       "Followed by" Linked Group Details   250 mg Oral Daily 08/16/20 0955 08/21/20 0959   08/16/20 1045  azithromycin (ZITHROMAX) tablet 500 mg       "Followed by" Linked Group Details   500 mg Oral  Once 08/16/20 0955 08/16/20 1040   08/12/20 2000  ceFEPIme (MAXIPIME) 2 g in sodium chloride 0.9 % 100 mL IVPB        2 g 200 mL/hr over 30 Minutes Intravenous Every 8 hours 08/12/20 1220     08/12/20 2000  metroNIDAZOLE (FLAGYL) IVPB 500  mg  Status:  Discontinued        500 mg 100 mL/hr over 60 Minutes Intravenous Every 8 hours 08/12/20 1339 08/14/20 1628   08/12/20 1300  linezolid (ZYVOX) IVPB 600 mg  Status:  Discontinued        600 mg 300 mL/hr over 60 Minutes Intravenous Every 12 hours 08/12/20 1150 08/13/20 1622   08/12/20 1200  ceFEPIme (MAXIPIME) 2 g in sodium chloride 0.9 % 100 mL IVPB        2 g 200 mL/hr over 30 Minutes Intravenous  Once 08/12/20 1150 08/12/20 1318   08/12/20 1200  metroNIDAZOLE (FLAGYL) IVPB 500 mg        500 mg 100 mL/hr over 60 Minutes Intravenous  Once 08/12/20 1150 08/12/20 1446       Objective   Vitals:   08/15/20 0634 08/15/20 1413 08/15/20 2105 08/16/20 0504  BP: 105/62 110/62 117/78 122/72  Pulse: 98 97 98 91  Resp: 16 18 18 18   Temp: 99 F (37.2 C) 98.8 F (37.1 C) 98.7 F (37.1 C) 98.6 F (37 C)  TempSrc: Oral Oral Oral Oral  SpO2: 96% 97% 96% 95%  Weight:      Height:        SpO2: 95 % O2 Flow Rate (L/min): 2  L/min  Wt Readings from Last 3 Encounters:  08/15/20 (!) 137.6 kg  08/06/20 133.5 kg  07/31/20 129.8 kg     Intake/Output Summary (Last 24 hours) at 08/16/2020 1240 Last data filed at 08/16/2020 0542 Gross per 24 hour  Intake 240 ml  Output 5150 ml  Net -4910 ml    Physical Exam:     Awake Alert, Oriented X 3, Normal affect No new F.N deficits,  [place].AT, Normal respiratory effort on room air, CTAB though limited by body habitus RRR,No Gallops,Rubs or new Murmurs,  +ve B.Sounds, Abd Soft, No tenderness, No rebound, guarding or rigidity. No Cyanosis, No new Rash or bruise     I have personally reviewed the following:   Data Reviewed:  CBC Recent Labs  Lab 08/12/20 1023 08/13/20 0538 08/14/20 0634 08/15/20 0829 08/16/20 0608  WBC 6.3 4.3 5.8 6.7 8.5  HGB 11.3* 9.2* 11.6* 11.4* 12.8  HCT 35.1* 28.5* 35.8* 35.2* 38.8  PLT 108* 85* 86* 133* 145*  MCV 90.2 89.3 88.4 88.0 85.7  MCH 29.0 28.8 28.6 28.5 28.3  MCHC 32.2 32.3 32.4 32.4 33.0  RDW 16.2* 15.9* 15.8* 15.9* 16.0*  LYMPHSABS 0.5*  --   --  1.2 1.3  MONOABS 0.5  --   --  0.7 1.0  EOSABS 0.1  --   --  0.2 0.3  BASOSABS 0.0  --   --  0.1 0.1    Chemistries  Recent Labs  Lab 08/12/20 1023 08/13/20 0538 08/14/20 0634 08/15/20 0829 08/16/20 0608  NA 136 137 135 135 130*  K 4.1 3.7 3.5 3.2* 3.5  CL 104 103 93* 93* 91*  CO2 23 25 28 31 28   GLUCOSE 56* 206* 311* 194* 264*  BUN 15 12 13 18 18   CREATININE 0.56 0.55 0.72 0.78 0.62  CALCIUM 8.7* 7.8* 8.3* 8.4* 8.8*  MG  --   --  1.8 2.0 2.0  AST 48* 33  --   --   --   ALT 44 38  --   --   --   ALKPHOS 90 72  --   --   --   BILITOT 1.1 1.0  --   --   --    ------------------------------------------------------------------------------------------------------------------  No results for input(s): CHOL, HDL, LDLCALC, TRIG, CHOLHDL, LDLDIRECT in the last 72 hours.  Lab Results  Component Value Date   HGBA1C 7.1 (H) 08/13/2020    ------------------------------------------------------------------------------------------------------------------ No results for input(s): TSH, T4TOTAL, T3FREE, THYROIDAB in the last 72 hours.  Invalid input(s): FREET3 ------------------------------------------------------------------------------------------------------------------ No results for input(s): VITAMINB12, FOLATE, FERRITIN, TIBC, IRON, RETICCTPCT in the last 72 hours.  Coagulation profile Recent Labs  Lab 08/13/20 0538  INR 1.2    No results for input(s): DDIMER in the last 72 hours.  Cardiac Enzymes No results for input(s): CKMB, TROPONINI, MYOGLOBIN in the last 168 hours.  Invalid input(s): CK ------------------------------------------------------------------------------------------------------------------    Component Value Date/Time   BNP 26.0 07/29/2020 1301    Micro Results Recent Results (from the past 240 hour(s))  Respiratory Panel by RT PCR (Flu A&B, Covid) - Nasopharyngeal Swab     Status: None   Collection Time: 08/12/20 10:23 AM   Specimen: Nasopharyngeal Swab  Result Value Ref Range Status   SARS Coronavirus 2 by RT PCR NEGATIVE NEGATIVE Final    Comment: (NOTE) SARS-CoV-2 target nucleic acids are NOT DETECTED.  The SARS-CoV-2 RNA is generally detectable in upper respiratoy specimens during the acute phase of infection. The lowest concentration of SARS-CoV-2 viral copies this assay can detect is 131 copies/mL. A negative result does not preclude SARS-Cov-2 infection and should not be used as the sole basis for treatment or other patient management decisions. A negative result may occur with  improper specimen collection/handling, submission of specimen other than nasopharyngeal swab, presence of viral mutation(s) within the areas targeted by this assay, and inadequate number of viral copies (<131 copies/mL). A negative result must be combined with clinical observations, patient history, and  epidemiological information. The expected result is Negative.  Fact Sheet for Patients:  PinkCheek.be  Fact Sheet for Healthcare Providers:  GravelBags.it  This test is no t yet approved or cleared by the [place] FDA and  has been authorized for detection and/or diagnosis of SARS-CoV-2 by FDA under an Emergency Use Authorization (EUA). This EUA will remain  in effect (meaning this test can be used) for the duration of the COVID-19 declaration under Section 564(b)(1) of the Act, 21 U.S.C. section 360bbb-3(b)(1), unless the authorization is terminated or revoked sooner.     Influenza A by PCR NEGATIVE NEGATIVE Final   Influenza B by PCR NEGATIVE NEGATIVE Final    Comment: (NOTE) The Xpert Xpress SARS-CoV-2/FLU/RSV assay is intended as an aid in  the diagnosis of influenza from Nasopharyngeal swab specimens and  should not be used as a sole basis for treatment. Nasal washings and  aspirates are unacceptable for Xpert Xpress SARS-CoV-2/FLU/RSV  testing.  Fact Sheet for Patients: PinkCheek.be  Fact Sheet for Healthcare Providers: GravelBags.it  This test is not yet approved or cleared by the [place] FDA and  has been authorized for detection and/or diagnosis of SARS-CoV-2 by  FDA under an Emergency Use Authorization (EUA). This EUA will remain  in effect (meaning this test can be used) for the duration of the  Covid-19 declaration under Section 564(b)(1) of the Act, 21  U.S.C. section 360bbb-3(b)(1), unless the authorization is  terminated or revoked. Performed at [facility], [address], [place], [place] [address]   Blood Culture (routine x 2)     Status: None (Preliminary result)   Collection Time: 08/12/20 12:36 PM   Specimen: BLOOD  Result Value Ref Range Status   Specimen Description BLOOD  Final  Special Requests NONE  Final   Culture    Final    NO GROWTH 4 DAYS Performed at [facility], [address] [place], [place] [address]    Report Status PENDING  Incomplete  Urine culture     Status: None   Collection Time: 08/12/20 12:40 PM   Specimen: Urine, Clean Catch  Result Value Ref Range Status   Specimen Description   Final    URINE, CLEAN CATCH Performed at [facility], [address], [place], [place] [address]    Special Requests   Final    NONE Performed at [facility], [address], [place], [place] [address]    Culture   Final    NO GROWTH Performed at [facility], [place] [address] [place], [place] [address]    Report Status 08/13/2020 FINAL  Final  Blood Culture (routine x 2)     Status: None (Preliminary result)   Collection Time: 08/12/20 12:43 PM   Specimen: BLOOD  Result Value Ref Range Status   Specimen Description BLOOD  Final   Special Requests NONE  Final   Culture   Final    NO GROWTH 4 DAYS Performed at [facility], [address], [place], [place] [address]    Report Status PENDING  Incomplete  MRSA PCR Screening     Status: None   Collection Time: 08/13/20  1:53 PM   Specimen: Nasal Mucosa; Nasopharyngeal  Result Value Ref Range Status   MRSA by PCR NEGATIVE NEGATIVE Final    Comment:        The GeneXpert MRSA Assay (FDA approved for NASAL specimens only), is one component of a comprehensive MRSA colonization surveillance program. It is not intended to diagnose MRSA infection nor to guide or monitor treatment for MRSA infections. Performed at [facility], [address] [place], [place] [address]     Radiology Reports DG Chest 2 View  Result Date: 07/29/2020 CLINICAL DATA:  Shortness of breath, chest pain EXAM: CHEST - 2 VIEW COMPARISON:  05/26/2019 FINDINGS: The heart size and mediastinal contours are within normal limits. Slightly low lung volumes with mild bibasilar atelectasis. No focal airspace consolidation, pleural effusion, or pneumothorax. The visualized  skeletal structures are unremarkable. IMPRESSION: Slightly low lung volumes with mild bibasilar atelectasis. Otherwise, no acute cardiopulmonary findings. Electronically Signed   By: Davina Poke D.O.   On: 07/29/2020 11:55   CT Chest Wo Contrast  Result Date: 07/29/2020 CLINICAL DATA:  Shortness of breath and chest pain. EXAM: CT CHEST WITHOUT CONTRAST TECHNIQUE: Multidetector CT imaging of the chest was performed following the standard protocol without IV contrast. COMPARISON:  July 17, 2016 FINDINGS: Cardiovascular: No significant vascular findings. Normal heart size. No pericardial effusion. Mediastinum/Nodes: No enlarged mediastinal or axillary lymph nodes. Thyroid gland, trachea, and esophagus demonstrate no significant findings. Lungs/Pleura: A mild amount of atelectasis is seen along the inferior aspect of the left upper lobe. There is no evidence of a pleural effusion or pneumothorax. Upper Abdomen: Multiple surgical clips are seen within the gallbladder fossa. The liver is moderately enlarged. The spleen is markedly enlarged. Musculoskeletal: No chest wall mass or suspicious bone lesions identified. IMPRESSION: 1. Mild inferior left upper lobe atelectasis. 2. Hepatosplenomegaly. 3. Evidence of prior cholecystectomy. Electronically Signed   By: Virgina Norfolk M.D.   On: 07/29/2020 15:46   NM Pulmonary Perfusion  Result Date: 08/12/2020 CLINICAL DATA:  34 year old female with positive D-dimer. EXAM: NUCLEAR MEDICINE PERFUSION LUNG SCAN TECHNIQUE: Perfusion images  were obtained in multiple projections after intravenous injection of radiopharmaceutical. Ventilation scans intentionally deferred if perfusion scan and chest x-ray adequate for interpretation during COVID 19 epidemic. RADIOPHARMACEUTICALS:  4.4 mCi Tc-46mMAA IV COMPARISON:  Chest radiograph dated 08/12/2020. FINDINGS: There is homogeneous perfusion.  No perfusion defect identified. IMPRESSION: Normal perfusion scan.  Electronically Signed   By: AAnner CreteM.D.   On: 08/12/2020 16:11   NM Pulmonary Perfusion  Result Date: 07/30/2020 CLINICAL DATA:  PE suspected, high prob Chest pain and shortness of breath. EXAM: NUCLEAR MEDICINE PERFUSION LUNG SCAN TECHNIQUE: Perfusion images were obtained in multiple projections after intravenous injection of radiopharmaceutical. Ventilation scans intentionally deferred if perfusion scan and chest x-ray adequate for interpretation during COVID 19 epidemic. RADIOPHARMACEUTICALS:  4.1 mCi Tc-934mAA IV COMPARISON:  Chest radiograph and CT yesterday. FINDINGS: There is a homogeneous distribution of radiotracer throughout both lungs. No evidence of perfusion defects. IMPRESSION: No evidence of pulmonary embolus.  Normal perfusion exam. Electronically Signed   By: MeKeith Rake.D.   On: 07/30/2020 18:50   [place] Img Lower Bilateral (DVT)  Result Date: 07/30/2020 CLINICAL DATA:  Lower extremity edema EXAM: BILATERAL LOWER EXTREMITY VENOUS DOPPLER ULTRASOUND TECHNIQUE: Gray-scale sonography with graded compression, as well as color Doppler and duplex ultrasound were performed to evaluate the lower extremity deep venous systems from the level of the common femoral vein and including the common femoral, femoral, profunda femoral, popliteal and calf veins including the posterior tibial, peroneal and gastrocnemius veins when visible. The superficial great saphenous vein was also interrogated. Spectral Doppler was utilized to evaluate flow at rest and with distal augmentation maneuvers in the common femoral, femoral and popliteal veins. COMPARISON:  None. FINDINGS: RIGHT LOWER EXTREMITY Common Femoral Vein: No evidence of thrombus. Normal compressibility, respiratory phasicity and response to augmentation. Saphenofemoral Junction: No evidence of thrombus. Normal compressibility and flow on color Doppler imaging. Profunda Femoral Vein: No evidence of thrombus. Normal compressibility  and flow on color Doppler imaging. Femoral Vein: No evidence of thrombus. Normal compressibility, respiratory phasicity and response to augmentation. Popliteal Vein: No evidence of thrombus. Normal compressibility, respiratory phasicity and response to augmentation. Calf Veins: No evidence of thrombus. Normal compressibility and flow on color Doppler imaging. LEFT LOWER EXTREMITY Common Femoral Vein: No evidence of thrombus. Normal compressibility, respiratory phasicity and response to augmentation. Saphenofemoral Junction: No evidence of thrombus. Normal compressibility and flow on color Doppler imaging. Profunda Femoral Vein: No evidence of thrombus. Normal compressibility and flow on color Doppler imaging. Femoral Vein: No evidence of thrombus. Normal compressibility, respiratory phasicity and response to augmentation. Popliteal Vein: No evidence of thrombus. Normal compressibility, respiratory phasicity and response to augmentation. Calf Veins: No evidence of thrombus. Normal compressibility and flow on color Doppler imaging. IMPRESSION: Limited exam because of body habitus. No evidence of significant deep venous thrombosis in either lower extremity. Electronically Signed   By: M.Jerilynn Mages Shick M.D.   On: 07/30/2020 15:44   DG CHEST PORT 1 VIEW  Result Date: 08/16/2020 CLINICAL DATA:  Cough, shortness of breath, negative COVID test EXAM: PORTABLE CHEST 1 VIEW COMPARISON:  08/12/2020 FINDINGS: Mildly prominent interstitial markings. Mild patchy right lower lobe opacity. No pleural effusion or pneumothorax. The heart is normal in size. IMPRESSION: Mild patchy right lower lobe opacity, suspicious for pneumonia. Electronically Signed   By: SrJulian Hy.D.   On: 08/16/2020 07:48   DG Chest Port 1 View  Result Date: 08/12/2020 CLINICAL DATA:  Shortness of breath EXAM: PORTABLE CHEST  1 VIEW COMPARISON:  July 29, 2020 chest radiograph and chest CT FINDINGS: There is bibasilar atelectasis. No edema or  airspace opacity. Heart is mildly enlarged with pulmonary vascularity normal. No adenopathy. No bone lesions. IMPRESSION: Mild bibasilar atelectasis. Lungs otherwise clear. Mild cardiac enlargement. No adenopathy. Electronically Signed   By: Lowella Grip III M.D.   On: 08/12/2020 10:54   ECHOCARDIOGRAM COMPLETE  Result Date: 07/31/2020    ECHOCARDIOGRAM REPORT   Patient Name:   LEEANNE BUTTERS Date of Exam: 07/31/2020 Medical Rec #:  [id number]        Height:       69.0 in Accession #:    [id number]       Weight:       302.0 lb Date of Birth:  December 01, 1985         BSA:          2.462 m Patient Age:    1 years         BP:           140/88 mmHg Patient Gender: F                HR:           108 bpm. Exam Location:  Inpatient Procedure: 2D Echo, Cardiac Doppler and Color Doppler Indications:    CHF-Acute Diastolic 681.27 / N17.00  History:        Patient has prior history of Echocardiogram examinations, most                 recent 07/05/2019. COPD; Risk Factors:Diabetes.  Sonographer:    Bernadene Person RDCS Referring Phys: [id number] AMRIT ADHIKARI  Sonographer Comments: Technically difficult study due to poor echo windows. IMPRESSIONS  1. Left ventricular ejection fraction, by estimation, is 55 to 60%. The left ventricle has normal function. Left ventricular endocardial border not optimally defined to evaluate regional wall motion. Left ventricular diastolic parameters are indeterminate.  2. Right ventricular systolic function is normal. The right ventricular size is normal.  3. The mitral valve is grossly normal. Trivial mitral valve regurgitation.  4. The aortic valve was not well visualized. Aortic valve regurgitation is not visualized.  5. Unable to estimate CVP. FINDINGS  Left Ventricle: Left ventricular ejection fraction, by estimation, is 55 to 60%. The left ventricle has normal function. Left ventricular endocardial border not optimally defined to evaluate regional wall motion. The left ventricular internal  cavity size was normal in size. There is no left ventricular hypertrophy. Left ventricular diastolic parameters are indeterminate. Right Ventricle: The right ventricular size is normal. No increase in right ventricular wall thickness. Right ventricular systolic function is normal. Left Atrium: Left atrial size was normal in size. Right Atrium: Right atrial size was normal in size. Pericardium: There is no evidence of pericardial effusion. Presence of pericardial fat pad. Mitral Valve: The mitral valve is grossly normal. Trivial mitral valve regurgitation. Tricuspid Valve: The tricuspid valve is grossly normal. Tricuspid valve regurgitation is trivial. Aortic Valve: The aortic valve was not well visualized. Aortic valve regurgitation is not visualized. Pulmonic Valve: The pulmonic valve was not well visualized. Pulmonic valve regurgitation is not visualized. Aorta: The aortic root is normal in size and structure. Venous: Unable to estimate CVP. The inferior vena cava was not well visualized. IAS/Shunts: The interatrial septum was not well visualized.  LEFT VENTRICLE PLAX 2D LVIDd:         5.74 cm  Diastology LVIDs:  3.97 cm  LV e' medial:    8.70 cm/s LV PW:         0.82 cm  LV E/e' medial:  9.6 LV IVS:        0.78 cm  LV e' lateral:   15.20 cm/s LVOT diam:     1.90 cm  LV E/e' lateral: 5.5 LV SV:         79 LV SV Index:   32 LVOT Area:     2.84 cm  RIGHT VENTRICLE RV S prime:     10.80 cm/s TAPSE (M-mode): 2.8 cm LEFT ATRIUM             Index       RIGHT ATRIUM           Index LA diam:        3.80 cm 1.54 cm/m  RA Area:     10.90 cm LA Vol (A2C):   26.9 ml 10.93 ml/m RA Volume:   21.40 ml  8.69 ml/m LA Vol (A4C):   46.4 ml 18.85 ml/m LA Biplane Vol: 36.8 ml 14.95 ml/m  AORTIC VALVE LVOT Vmax:   155.00 cm/s LVOT Vmean:  117.000 cm/s LVOT VTI:    0.278 m  AORTA Ao Root diam: 3.10 cm MITRAL VALVE               TRICUSPID VALVE MV Area (PHT): 4.29 cm    TR Peak grad:   11.2 mmHg MV Decel Time: 177 msec     TR Vmax:        167.00 cm/s MV E velocity: 83.90 cm/s MV A velocity: 85.90 cm/s  SHUNTS MV E/A ratio:  0.98        Systemic VTI:  0.28 m                            Systemic Diam: 1.90 cm Rozann Lesches MD Electronically signed by Rozann Lesches MD Signature Date/Time: 07/31/2020/11:51:14 AM    Final    [place] ASCITES (ABDOMEN LIMITED)  Result Date: 07/30/2020 CLINICAL DATA:  Cirrhosis, ascites EXAM: LIMITED ABDOMEN ULTRASOUND FOR ASCITES TECHNIQUE: Limited ultrasound survey for ascites was performed in all four abdominal quadrants. COMPARISON:  07/29/2020 FINDINGS: Trace ascites. Insufficient ascites for paracentesis. IMPRESSION: Trace ascites, insufficient for paracentesis. Electronically Signed   By: Lavonia Dana M.D.   On: 07/30/2020 10:37   [place] ASCITES (ABDOMEN LIMITED)  Result Date: 07/29/2020 CLINICAL DATA:  Alcoholic cirrhosis, abdominal swelling EXAM: LIMITED ABDOMEN ULTRASOUND FOR ASCITES TECHNIQUE: Limited ultrasound survey for ascites was performed in all four abdominal quadrants. COMPARISON:  CT abdomen and pelvis 12/04/2019 FINDINGS: Minimal ascites identified. Insufficient ascites for paracentesis. IMPRESSION: Insufficient ascites for paracentesis. Electronically Signed   By: Lavonia Dana M.D.   On: 07/29/2020 16:57     Time Spent in minutes  30     Desiree Hane M.D on 08/16/2020 at 12:40 PM  To page go to [email or web address] - password [email or web address]

## 2020-08-17 DIAGNOSIS — A419 Sepsis, unspecified organism: Secondary | ICD-10-CM | POA: Diagnosis not present

## 2020-08-17 DIAGNOSIS — R6520 Severe sepsis without septic shock: Secondary | ICD-10-CM | POA: Diagnosis not present

## 2020-08-17 LAB — PROCALCITONIN: Procalcitonin: <0.1 ng/mL

## 2020-08-17 LAB — CBC WITH DIFFERENTIAL/PLATELET
Abs Immature Granulocytes: 0.3 10*3/uL — ABNORMAL HIGH (ref 0.00–0.07)
Basophils Absolute: 0.1 10*3/uL (ref 0.0–0.1)
Basophils Relative: 1 %
Eosinophils Absolute: 0.2 10*3/uL (ref 0.0–0.5)
Eosinophils Relative: 3 %
HCT: 37.2 % (ref 36.0–46.0)
Hemoglobin: 12.3 g/dL (ref 12.0–15.0)
Immature Granulocytes: 4 %
Lymphocytes Relative: 18 %
Lymphs Abs: 1.4 10*3/uL (ref 0.7–4.0)
MCH: 29.1 pg (ref 26.0–34.0)
MCHC: 33.1 g/dL (ref 30.0–36.0)
MCV: 87.9 fL (ref 80.0–100.0)
Monocytes Absolute: 0.8 10*3/uL (ref 0.1–1.0)
Monocytes Relative: 10 %
Neutro Abs: 5.2 10*3/uL (ref 1.7–7.7)
Neutrophils Relative %: 64 %
Platelets: 121 10*3/uL — ABNORMAL LOW (ref 150–400)
RBC: 4.23 MIL/uL (ref 3.87–5.11)
RDW: 16.1 % — ABNORMAL HIGH (ref 11.5–15.5)
WBC: 8 10*3/uL (ref 4.0–10.5)
nRBC: 0 % (ref 0.0–0.2)

## 2020-08-17 LAB — CULTURE, BLOOD (ROUTINE X 2)
Culture: NO GROWTH
Culture: NO GROWTH

## 2020-08-17 LAB — BASIC METABOLIC PANEL
Anion gap: 8 (ref 5–15)
BUN: 15 mg/dL (ref 6–20)
CO2: 26 mmol/L (ref 22–32)
Calcium: 8.9 mg/dL (ref 8.9–10.3)
Chloride: 100 mmol/L (ref 98–111)
Creatinine, Ser: 0.57 mg/dL (ref 0.44–1.00)
GFR, Estimated: >60 mL/min (ref >60)
Glucose, Bld: 223 mg/dL — ABNORMAL HIGH (ref 70–99)
Potassium: 4.3 mmol/L (ref 3.5–5.1)
Sodium: 134 mmol/L — ABNORMAL LOW (ref 135–145)

## 2020-08-17 LAB — GLUCOSE, CAPILLARY
Glucose-Capillary: 190 mg/dL — ABNORMAL HIGH (ref 70–99)
Glucose-Capillary: 290 mg/dL — ABNORMAL HIGH (ref 70–99)

## 2020-08-17 MED ORDER — AMOXICILLIN-POT CLAVULANATE 875-125 MG PO TABS: 1.0000 | ORAL_TABLET | Freq: Two times a day (BID) | ORAL | 0 refills | Status: AC

## 2020-08-17 MED ORDER — DIPHENHYDRAMINE HCL 25 MG PO CAPS
25.0000 mg | ORAL_CAPSULE | ORAL | Status: AC
  Administered 2020-08-17: 25 mg via ORAL
  Filled 2020-08-17: qty 1

## 2020-08-17 MED ORDER — AMOXICILLIN-POT CLAVULANATE 875-125 MG PO TABS: 1.0000 | ORAL_TABLET | Freq: Two times a day (BID) | ORAL | Status: DC

## 2020-08-17 NOTE — Hospital Course (Signed)
Alexia Dinger is a 34 y.o. year old female with Medical history significant for NAFLD/NASH cirrhosis, prior history of IV drug use and hep C  (status post treatment, abstained for the past 10 years) class III obesity, type 2 diabetes, chronic pain on methadone who presented on 08/12/2020 with shortness of breath and 20 pound weight gain in the past 2 weeks and was found to have fever, tachycardia, tachypnea, hypoxia and lactic acidosis concerning for severe sepsis of unclear etiology based off work-up.   Initially required linezolid, cefepime, metronidazole.  Linezolid discontinued after MRSA PCR screen negative.  Found to have elevated D-dimer however underwent VQ scan which showed low probability for PE (has contrast allergy unable to do CTA chest).  During hospital course patient has been continued on IV antibiotics as well as IV Lasix for volume overload.  Overall she responded well; her sepsis picture resolved; no clear source found. PCT normalized. She completed appropriate abx and discharged with remainder PO course outpatient.  She had some mild hyponatremia and diuretics were held temporarily with noted Na improvement. She was resumed on her home lasix at discharge and will have outpatient follow up with her PCP for repeat BMP after discharge.

## 2020-08-17 NOTE — Progress Notes (Signed)
IV removed and discharge instructions reviewed.  To follow up with primary MD on Monday and have labs. Scripts sent to pharmacy.  Family to drive home

## 2020-08-17 NOTE — Discharge Summary (Addendum)
Physician Discharge Summary   Canda Podgorski STM:[id number] DOB: December 06, 1985 DOA: 08/12/2020  PCP: Neale Burly, MD  Admit date: 08/12/2020 Discharge date: 08/17/2020  Admitted From: home Disposition:  home Discharging physician: Dwyane Dee, MD  Recommendations for Outpatient Follow-up:  1. Repeat BMP  Patient discharged to home in Discharge Condition: stable CODE STATUS: Full Diet recommendation:  Diet Orders (From admission, onward)    Start     Ordered   08/17/20 0000  Diet Carb Modified        08/17/20 1245   08/15/20 1342  Diet Carb Modified Fluid consistency: Thin; Room service appropriate? Yes  Diet effective now       Comments: 2 gram sodium  Question Answer Comment  Diet-HS Snack? Nothing   Calorie Level Medium 1600-2000   Fluid consistency: Thin   Room service appropriate? Yes      08/15/20 1342          Hospital Course: Jaime [name] is a 34 y.o. year old female with Medical history significant for NAFLD/NASH cirrhosis, prior history of IV drug use and hep C  (status post treatment, abstained for the past 10 years) class III obesity, type 2 diabetes, chronic pain on methadone who presented on 08/12/2020 with shortness of breath and 20 pound weight gain in the past 2 weeks and was found to have fever, tachycardia, tachypnea, hypoxia and lactic acidosis concerning for severe sepsis of unclear etiology based off work-up.   Initially required linezolid, cefepime, metronidazole.  Linezolid discontinued after MRSA PCR screen negative.  Found to have elevated D-dimer however underwent VQ scan which showed low probability for PE (has contrast allergy unable to do CTA chest).  During hospital course patient has been continued on IV antibiotics as well as IV Lasix for volume overload.  Overall she responded well; her sepsis picture resolved; no clear source found. PCT normalized. She completed appropriate abx and discharged with remainder PO course outpatient.  She  had some mild hyponatremia and diuretics were held temporarily with noted Na improvement. She was resumed on her home lasix at discharge and will have outpatient follow up with her PCP for repeat BMP after discharge.    The patient's chronic medical conditions were treated accordingly per the patient's home medication regimen except as noted.  On day of discharge, patient was felt deemed stable for discharge. Patient/family member advised to call PCP or come back to ER if needed.   Principal Diagnosis: Severe sepsis [facility])  Discharge Diagnoses: Active Hospital Problems   Diagnosis Date Noted  . Hyponatremia 08/16/2020  . Volume overload 08/14/2020  . Uncontrolled diabetes mellitus ([place]) 09/19/2018  . Cirrhosis ([place]) 09/15/2018  . Diabetes mellitus without complication ([place]) 89/21/1941  . Long-term current use of methadone for opiate dependence ([place]) 09/15/2018  . Chronic pain disorder 07/17/2016    Resolved Hospital Problems   Diagnosis Date Noted Date Resolved  . Severe sepsis [facility]) 08/12/2020 08/17/2020    Discharge Instructions    Diet Carb Modified   Complete by: As directed    Increase activity slowly   Complete by: As directed      Allergies as of 08/17/2020      Reactions   Iodine-131 Anaphylaxis, Shortness Of Breath, Swelling   Ivp Dye [iodinated Diagnostic Agents] Anaphylaxis   Skin gets very red, unable to walk   Ketorolac Tromethamine Shortness Of Breath   Tylenol [acetaminophen] Other (See Comments)   Due to cirrhosis   Gabapentin    Morphine And Related  Pt says she is not allergic to Morphine 08/12/20   Nsaids Other (See Comments)   Flares ulcers   Suboxone [buprenorphine Hcl-naloxone Hcl]    Vancomycin Swelling   Facial swelling,       Medication List    TAKE these medications   Accu-Chek Guide test strip Generic drug: glucose blood Use as instructed to check blood glucose four times daily   Accu-Chek Softclix Lancets lancets 4 (four) times  daily.   albuterol (2.5 MG/3ML) 0.083% nebulizer solution Commonly known as: PROVENTIL Take 2.5 mg by nebulization every 6 (six) hours as needed for wheezing or shortness of breath.   albuterol 0.63 MG/3ML nebulizer solution Commonly known as: ACCUNEB Take by nebulization 4 (four) times daily.   amoxicillin-clavulanate 875-125 MG tablet Commonly known as: AUGMENTIN Take 1 tablet by mouth every 12 (twelve) hours for 2 days.   aspirin EC 81 MG tablet Take 162 mg by mouth daily. Swallow whole.   cetirizine 10 MG tablet Commonly known as: ZyrTEC Allergy Take 1 tablet (10 mg total) by mouth daily.   clonazePAM 0.5 MG tablet Commonly known as: KlonoPIN Take 1 tablet (0.5 mg total) by mouth 2 (two) times daily.   cyclobenzaprine 10 MG tablet Commonly known as: FLEXERIL Take 1 tablet (10 mg total) by mouth at bedtime. What changed: when to take this   Dexcom G6 Sensor Misc 4 Pieces by Does not apply route once a week.   Dexcom G6 Transmitter Misc 1 Piece by Does not apply route as directed.   dicyclomine 10 MG capsule Commonly known as: BENTYL Take 10 mg by mouth 3 (three) times daily as needed for spasms.   docusate sodium 100 MG capsule Commonly known as: COLACE Take 100 mg by mouth 4 (four) times daily.   fluconazole 200 MG tablet Commonly known as: DIFLUCAN Take 400 mg by mouth daily.   fluticasone 50 MCG/ACT nasal spray Commonly known as: FLONASE Place 1 spray into both nostrils daily for 14 days.   furosemide 40 MG tablet Commonly known as: LASIX Take 2 tablets by mouth daily.   hyoscyamine 0.125 MG Tbdp disintergrating tablet Commonly known as: ANASPAZ Place 0.125 mg under the tongue 3 (three) times daily as needed for bladder spasms or cramping (Patient Preference).   insulin isophane & regular human (70-30) 100 UNIT/ML KwikPen Commonly known as: HUMULIN 70/30 MIX Inject 90 units with breakfast and 80 units with supper   lisinopril 5 MG tablet Commonly  known as: ZESTRIL Take 1 tablet (5 mg total) by mouth daily.   metFORMIN 1000 MG tablet Commonly known as: GLUCOPHAGE Take 1 tablet (1,000 mg total) by mouth 2 (two) times daily with a meal.   methadone 10 MG/5ML solution Commonly known as: DOLOPHINE Take 25 mg by mouth every morning.   multivitamin tablet Take 1 tablet by mouth daily.   Nexplanon 68 MG Impl implant Generic drug: etonogestrel 1 each by Subdermal route once.   nystatin 100000 UNIT/ML suspension Commonly known as: MYCOSTATIN Take 5 mLs (500,000 Units total) by mouth 4 (four) times daily.   ondansetron 4 MG disintegrating tablet Commonly known as: ZOFRAN-ODT Take 4 mg by mouth every 8 (eight) hours as needed for nausea.   pantoprazole 40 MG tablet Commonly known as: PROTONIX Take 40 mg by mouth 2 (two) times daily.   Pen Needles 32G X 6 MM Misc 1 each by Does not apply route 3 (three) times daily.   potassium chloride 10 MEQ tablet Commonly known as: KLOR-CON  Take 2 tablets by mouth daily.   pregabalin 75 MG capsule Commonly known as: LYRICA Take 75 mg by mouth in the morning, at noon, in the evening, and at bedtime.   propranolol 10 MG tablet Commonly known as: INDERAL Take 10 mg by mouth 3 (three) times daily.   psyllium 0.52 g capsule Commonly known as: REGULOID Take 0.52 g by mouth 4 (four) times daily.   QUEtiapine 25 MG tablet Commonly known as: SEROQUEL Take 1 tablet (25 mg total) by mouth at bedtime.   Restasis 0.05 % ophthalmic emulsion Generic drug: cycloSPORINE Place 1 drop into both eyes as needed (dry eye).   rosuvastatin 5 MG tablet Commonly known as: Crestor Take 1 tablet (5 mg total) by mouth daily.   spironolactone 100 MG tablet Commonly known as: ALDACTONE Take 200 mg by mouth daily.   sucralfate 1 g tablet Commonly known as: CARAFATE Take 1 g by mouth 4 (four) times daily.   venlafaxine XR 75 MG 24 hr capsule Commonly known as: EFFEXOR-XR Take 75 mg by mouth  daily. To take along with 167m for a total of 225 mg daily What changed: Another medication with the same name was changed. Make sure you understand how and when to take each.   venlafaxine XR 150 MG 24 hr capsule Commonly known as: EFFEXOR-XR Take total of 225 mg daily (150 mg + 75 mg ) What changed:   how much to take  how to take this  when to take this  additional instructions   Victoza 18 MG/3ML Sopn Generic drug: liraglutide Inject 1.8 mg into the skin daily.       Allergies  Allergen Reactions  . Iodine-131 Anaphylaxis, Shortness Of Breath and Swelling  . Ivp Dye [Iodinated Diagnostic Agents] Anaphylaxis    Skin gets very red, unable to walk   . Ketorolac Tromethamine Shortness Of Breath  . Tylenol [Acetaminophen] Other (See Comments)    Due to cirrhosis   . Gabapentin   . Morphine And Related     Pt says she is not allergic to Morphine 08/12/20  . Nsaids Other (See Comments)    Flares ulcers  . Suboxone [Buprenorphine Hcl-Naloxone Hcl]   . Vancomycin Swelling    Facial swelling,     Consultations: none  Discharge Exam: BP 115/69 (BP Location: Left Arm)   Pulse 88   Temp 98.2 F (36.8 C) (Oral)   Resp 18   Ht 5' 9"  (1.753 m)   Wt 130.6 kg   SpO2 95%   BMI 42.52 kg/m  General appearance: alert, cooperative and no distress Head: Normocephalic, without obvious abnormality, atraumatic Eyes: EOMI Lungs: clear to auscultation bilaterally Heart: regular rate and rhythm and S1, S2 normal Abdomen: obese, soft, subtle nonsp TTP, ND, BS present Extremities: trace LE edema Skin: mobility and turgor normal Neurologic: Grossly normal  The results of significant diagnostics from this hospitalization (including imaging, microbiology, ancillary and laboratory) are listed below for reference.   Microbiology: Recent Results (from the past 240 hour(s))  Respiratory Panel by RT PCR (Flu A&B, Covid) - Nasopharyngeal Swab     Status: None   Collection Time:  08/12/20 10:23 AM   Specimen: Nasopharyngeal Swab  Result Value Ref Range Status   SARS Coronavirus 2 by RT PCR NEGATIVE NEGATIVE Final    Comment: (NOTE) SARS-CoV-2 target nucleic acids are NOT DETECTED.  The SARS-CoV-2 RNA is generally detectable in upper respiratoy specimens during the acute phase of infection. The lowest concentration of  SARS-CoV-2 viral copies this assay can detect is 131 copies/mL. A negative result does not preclude SARS-Cov-2 infection and should not be used as the sole basis for treatment or other patient management decisions. A negative result may occur with  improper specimen collection/handling, submission of specimen other than nasopharyngeal swab, presence of viral mutation(s) within the areas targeted by this assay, and inadequate number of viral copies (<131 copies/mL). A negative result must be combined with clinical observations, patient history, and epidemiological information. The expected result is Negative.  Fact Sheet for Patients:  PinkCheek.be  Fact Sheet for Healthcare Providers:  GravelBags.it  This test is no t yet approved or cleared by the [place] FDA and  has been authorized for detection and/or diagnosis of SARS-CoV-2 by FDA under an Emergency Use Authorization (EUA). This EUA will remain  in effect (meaning this test can be used) for the duration of the COVID-19 declaration under Section 564(b)(1) of the Act, 21 U.S.C. section 360bbb-3(b)(1), unless the authorization is terminated or revoked sooner.     Influenza A by PCR NEGATIVE NEGATIVE Final   Influenza B by PCR NEGATIVE NEGATIVE Final    Comment: (NOTE) The Xpert Xpress SARS-CoV-2/FLU/RSV assay is intended as an aid in  the diagnosis of influenza from Nasopharyngeal swab specimens and  should not be used as a sole basis for treatment. Nasal washings and  aspirates are unacceptable for Xpert Xpress  SARS-CoV-2/FLU/RSV  testing.  Fact Sheet for Patients: PinkCheek.be  Fact Sheet for Healthcare Providers: GravelBags.it  This test is not yet approved or cleared by the [place] FDA and  has been authorized for detection and/or diagnosis of SARS-CoV-2 by  FDA under an Emergency Use Authorization (EUA). This EUA will remain  in effect (meaning this test can be used) for the duration of the  Covid-19 declaration under Section 564(b)(1) of the Act, 21  U.S.C. section 360bbb-3(b)(1), unless the authorization is  terminated or revoked. Performed at [facility], [address] [place], [place] [address]   Blood Culture (routine x 2)     Status: None   Collection Time: 08/12/20 12:36 PM   Specimen: BLOOD  Result Value Ref Range Status   Specimen Description BLOOD  Final   Special Requests NONE  Final   Culture   Final    NO GROWTH 5 DAYS Performed at [facility], [address] [place], [place] [address]    Report Status 08/17/2020 FINAL  Final  Urine culture     Status: None   Collection Time: 08/12/20 12:40 PM   Specimen: Urine, Clean Catch  Result Value Ref Range Status   Specimen Description   Final    URINE, CLEAN CATCH Performed at [facility], [address], [place], [place] [address]    Special Requests   Final    NONE Performed at [facility], [address] [place], [place] [address]    Culture   Final    NO GROWTH Performed at [facility], [place] [address], [place], [place] [address]    Report Status 08/13/2020 FINAL  Final  Blood Culture (routine x 2)     Status: None   Collection Time: 08/12/20 12:43 PM   Specimen: BLOOD  Result Value Ref Range Status   Specimen Description BLOOD  Final   Special Requests NONE  Final   Culture   Final    NO GROWTH 5 DAYS Performed at [facility], [address] [place], [place] [address]  Report Status 08/17/2020 FINAL  Final  MRSA PCR Screening      Status: None   Collection Time: 08/13/20  1:53 PM   Specimen: Nasal Mucosa; Nasopharyngeal  Result Value Ref Range Status   MRSA by PCR NEGATIVE NEGATIVE Final    Comment:        The GeneXpert MRSA Assay (FDA approved for NASAL specimens only), is one component of a comprehensive MRSA colonization surveillance program. It is not intended to diagnose MRSA infection nor to guide or monitor treatment for MRSA infections. Performed at [facility], [address], [place],  [address]      Labs: BNP (last 3 results) Recent Labs    07/29/20 1301  BNP 92.1   Basic Metabolic Panel: Recent Labs  Lab 08/13/20 0538 08/14/20 0634 08/15/20 0829 08/16/20 0608 08/17/20 0643  NA 137 135 135 130* 134*  K 3.7 3.5 3.2* 3.5 4.3  CL 103 93* 93* 91* 100  CO2 25 28 31 28 26   GLUCOSE 206* 311* 194* 264* 223*  BUN 12 13 18 18 15   CREATININE 0.55 0.72 0.78 0.62 0.57  CALCIUM 7.8* 8.3* 8.4* 8.8* 8.9  MG  --  1.8 2.0 2.0  --   PHOS  --  3.0  --   --   --    Liver Function Tests: Recent Labs  Lab 08/12/20 1023 08/13/20 0538  AST 48* 33  ALT 44 38  ALKPHOS 90 72  BILITOT 1.1 1.0  PROT 7.0 5.7*  ALBUMIN 3.1* 2.6*   No results for input(s): LIPASE, AMYLASE in the last 168 hours. No results for input(s): AMMONIA in the last 168 hours. CBC: Recent Labs  Lab 08/12/20 1023 08/12/20 1023 08/13/20 0538 08/14/20 0634 08/15/20 0829 08/16/20 0608 08/17/20 0643  WBC 6.3   < > 4.3 5.8 6.7 8.5 8.0  NEUTROABS 5.2  --   --   --  4.2 5.5 5.2  HGB 11.3*   < > 9.2* 11.6* 11.4* 12.8 12.3  HCT 35.1*   < > 28.5* 35.8* 35.2* 38.8 37.2  MCV 90.2   < > 89.3 88.4 88.0 85.7 87.9  PLT 108*   < > 85* 86* 133* 145* 121*   < > = values in this interval not displayed.   Cardiac Enzymes: No results for input(s): CKTOTAL, CKMB, CKMBINDEX, TROPONINI in the last 168 hours. BNP: Invalid input(s): POCBNP CBG: Recent Labs  Lab 08/16/20 1146 08/16/20 1655 08/16/20 2036 08/17/20 0811  08/17/20 1120  GLUCAP 300* 340* 379* 190* 290*   D-Dimer No results for input(s): DDIMER in the last 72 hours. Hgb A1c No results for input(s): HGBA1C in the last 72 hours. Lipid Profile No results for input(s): CHOL, HDL, LDLCALC, TRIG, CHOLHDL, LDLDIRECT in the last 72 hours. Thyroid function studies No results for input(s): TSH, T4TOTAL, T3FREE, THYROIDAB in the last 72 hours.  Invalid input(s): FREET3 Anemia work up No results for input(s): VITAMINB12, FOLATE, FERRITIN, TIBC, IRON, RETICCTPCT in the last 72 hours. Urinalysis    Component Value Date/Time   COLORURINE YELLOW 08/12/2020 1240   APPEARANCEUR CLOUDY (A) 08/12/2020 1240   LABSPEC 1.018 08/12/2020 1240   PHURINE 6.0 08/12/2020 1240   GLUCOSEU NEGATIVE 08/12/2020 1240   HGBUR NEGATIVE 08/12/2020 [place] 08/12/2020 1240   BILIRUBINUR negative 04/10/2020 1822   KETONESUR NEGATIVE 08/12/2020 1240   PROTEINUR NEGATIVE 08/12/2020 1240   UROBILINOGEN 1.0 04/10/2020 1822   UROBILINOGEN 1.0 10/07/2013 0644   NITRITE NEGATIVE 08/12/2020 1240  LEUKOCYTESUR NEGATIVE 08/12/2020 1240   Sepsis Labs Invalid input(s): PROCALCITONIN,  WBC,  LACTICIDVEN Microbiology Recent Results (from the past 240 hour(s))  Respiratory Panel by RT PCR (Flu A&B, Covid) - Nasopharyngeal Swab     Status: None   Collection Time: 08/12/20 10:23 AM   Specimen: Nasopharyngeal Swab  Result Value Ref Range Status   SARS Coronavirus 2 by RT PCR NEGATIVE NEGATIVE Final    Comment: (NOTE) SARS-CoV-2 target nucleic acids are NOT DETECTED.  The SARS-CoV-2 RNA is generally detectable in upper respiratoy specimens during the acute phase of infection. The lowest concentration of SARS-CoV-2 viral copies this assay can detect is 131 copies/mL. A negative result does not preclude SARS-Cov-2 infection and should not be used as the sole basis for treatment or other patient management decisions. A negative result may occur with  improper  specimen collection/handling, submission of specimen other than nasopharyngeal swab, presence of viral mutation(s) within the areas targeted by this assay, and inadequate number of viral copies (<131 copies/mL). A negative result must be combined with clinical observations, patient history, and epidemiological information. The expected result is Negative.  Fact Sheet for Patients:  PinkCheek.be  Fact Sheet for Healthcare Providers:  GravelBags.it  This test is no t yet approved or cleared by the [place] FDA and  has been authorized for detection and/or diagnosis of SARS-CoV-2 by FDA under an Emergency Use Authorization (EUA). This EUA will remain  in effect (meaning this test can be used) for the duration of the COVID-19 declaration under Section 564(b)(1) of the Act, 21 U.S.C. section 360bbb-3(b)(1), unless the authorization is terminated or revoked sooner.     Influenza A by PCR NEGATIVE NEGATIVE Final   Influenza B by PCR NEGATIVE NEGATIVE Final    Comment: (NOTE) The Xpert Xpress SARS-CoV-2/FLU/RSV assay is intended as an aid in  the diagnosis of influenza from Nasopharyngeal swab specimens and  should not be used as a sole basis for treatment. Nasal washings and  aspirates are unacceptable for Xpert Xpress SARS-CoV-2/FLU/RSV  testing.  Fact Sheet for Patients: PinkCheek.be  Fact Sheet for Healthcare Providers: GravelBags.it  This test is not yet approved or cleared by the [place] FDA and  has been authorized for detection and/or diagnosis of SARS-CoV-2 by  FDA under an Emergency Use Authorization (EUA). This EUA will remain  in effect (meaning this test can be used) for the duration of the  Covid-19 declaration under Section 564(b)(1) of the Act, 21  U.S.C. section 360bbb-3(b)(1), unless the authorization is  terminated or revoked. Performed at  [facility], [address] [place], [place] [address]   Blood Culture (routine x 2)     Status: None   Collection Time: 08/12/20 12:36 PM   Specimen: BLOOD  Result Value Ref Range Status   Specimen Description BLOOD  Final   Special Requests NONE  Final   Culture   Final    NO GROWTH 5 DAYS Performed at [facility], [address] [place], [place] [address]    Report Status 08/17/2020 FINAL  Final  Urine culture     Status: None   Collection Time: 08/12/20 12:40 PM   Specimen: Urine, Clean Catch  Result Value Ref Range Status   Specimen Description   Final    URINE, CLEAN CATCH Performed at [facility], [address] [place], [place] [address]    Special Requests   Final    NONE Performed at [facility], [address] [place], [place]  [address]    Culture   Final    NO GROWTH Performed at [facility], [place] [address] [place], [place] [address]    Report Status 08/13/2020 FINAL  Final  Blood Culture (routine x 2)     Status: None   Collection Time: 08/12/20 12:43 PM   Specimen: BLOOD  Result Value Ref Range Status   Specimen Description BLOOD  Final   Special Requests NONE  Final   Culture   Final    NO GROWTH 5 DAYS Performed at [facility], [address] [place], [place] [address]    Report Status 08/17/2020 FINAL  Final  MRSA PCR Screening     Status: None   Collection Time: 08/13/20  1:53 PM   Specimen: Nasal Mucosa; Nasopharyngeal  Result Value Ref Range Status   MRSA by PCR NEGATIVE NEGATIVE Final    Comment:        The GeneXpert MRSA Assay (FDA approved for NASAL specimens only), is one component of a comprehensive MRSA colonization surveillance program. It is not intended to diagnose MRSA infection nor to guide or monitor treatment for MRSA infections. Performed at [facility], [address], [place], [place] [address]     Procedures/Studies: DG Chest 2 View  Result Date: 07/29/2020 CLINICAL DATA:  Shortness of breath, chest pain  EXAM: CHEST - 2 VIEW COMPARISON:  05/26/2019 FINDINGS: The heart size and mediastinal contours are within normal limits. Slightly low lung volumes with mild bibasilar atelectasis. No focal airspace consolidation, pleural effusion, or pneumothorax. The visualized skeletal structures are unremarkable. IMPRESSION: Slightly low lung volumes with mild bibasilar atelectasis. Otherwise, no acute cardiopulmonary findings. Electronically Signed   By: Davina Poke D.O.   On: 07/29/2020 11:55   CT Chest Wo Contrast  Result Date: 07/29/2020 CLINICAL DATA:  Shortness of breath and chest pain. EXAM: CT CHEST WITHOUT CONTRAST TECHNIQUE: Multidetector CT imaging of the chest was performed following the standard protocol without IV contrast. COMPARISON:  July 17, 2016 FINDINGS: Cardiovascular: No significant vascular findings. Normal heart size. No pericardial effusion. Mediastinum/Nodes: No enlarged mediastinal or axillary lymph nodes. Thyroid gland, trachea, and esophagus demonstrate no significant findings. Lungs/Pleura: A mild amount of atelectasis is seen along the inferior aspect of the left upper lobe. There is no evidence of a pleural effusion or pneumothorax. Upper Abdomen: Multiple surgical clips are seen within the gallbladder fossa. The liver is moderately enlarged. The spleen is markedly enlarged. Musculoskeletal: No chest wall mass or suspicious bone lesions identified. IMPRESSION: 1. Mild inferior left upper lobe atelectasis. 2. Hepatosplenomegaly. 3. Evidence of prior cholecystectomy. Electronically Signed   By: Virgina Norfolk M.D.   On: 07/29/2020 15:46   NM Pulmonary Perfusion  Result Date: 08/12/2020 CLINICAL DATA:  34 year old female with positive D-dimer. EXAM: NUCLEAR MEDICINE PERFUSION LUNG SCAN TECHNIQUE: Perfusion images were obtained in multiple projections after intravenous injection of radiopharmaceutical. Ventilation scans intentionally deferred if perfusion scan and chest x-ray  adequate for interpretation during COVID 19 epidemic. RADIOPHARMACEUTICALS:  4.4 mCi Tc-77mMAA IV COMPARISON:  Chest radiograph dated 08/12/2020. FINDINGS: There is homogeneous perfusion.  No perfusion defect identified. IMPRESSION: Normal perfusion scan. Electronically Signed   By: AAnner CreteM.D.   On: 08/12/2020 16:11   NM Pulmonary Perfusion  Result Date: 07/30/2020 CLINICAL DATA:  PE suspected, high prob Chest pain and shortness of breath. EXAM: NUCLEAR MEDICINE PERFUSION LUNG SCAN TECHNIQUE: Perfusion images were obtained in multiple projections after intravenous injection of radiopharmaceutical. Ventilation scans intentionally deferred  if perfusion scan and chest x-ray adequate for interpretation during COVID 19 epidemic. RADIOPHARMACEUTICALS:  4.1 mCi Tc-70mMAA IV COMPARISON:  Chest radiograph and CT yesterday. FINDINGS: There is a homogeneous distribution of radiotracer throughout both lungs. No evidence of perfusion defects. IMPRESSION: No evidence of pulmonary embolus.  Normal perfusion exam. Electronically Signed   By: MKeith RakeM.D.   On: 07/30/2020 18:50   [place]ous Img Lower Bilateral (DVT)  Result Date: 07/30/2020 CLINICAL DATA:  Lower extremity edema EXAM: BILATERAL LOWER EXTREMITY VENOUS DOPPLER ULTRASOUND TECHNIQUE: Gray-scale sonography with graded compression, as well as color Doppler and duplex ultrasound were performed to evaluate the lower extremity deep venous systems from the level of the common femoral vein and including the common femoral, femoral, profunda femoral, popliteal and calf veins including the posterior tibial, peroneal and gastrocnemius veins when visible. The superficial great saphenous vein was also interrogated. Spectral Doppler was utilized to evaluate flow at rest and with distal augmentation maneuvers in the common femoral, femoral and popliteal veins. COMPARISON:  None. FINDINGS: RIGHT LOWER EXTREMITY Common Femoral Vein: No evidence of thrombus.  Normal compressibility, respiratory phasicity and response to augmentation. Saphenofemoral Junction: No evidence of thrombus. Normal compressibility and flow on color Doppler imaging. Profunda Femoral Vein: No evidence of thrombus. Normal compressibility and flow on color Doppler imaging. Femoral Vein: No evidence of thrombus. Normal compressibility, respiratory phasicity and response to augmentation. Popliteal Vein: No evidence of thrombus. Normal compressibility, respiratory phasicity and response to augmentation. Calf Veins: No evidence of thrombus. Normal compressibility and flow on color Doppler imaging. LEFT LOWER EXTREMITY Common Femoral Vein: No evidence of thrombus. Normal compressibility, respiratory phasicity and response to augmentation. Saphenofemoral Junction: No evidence of thrombus. Normal compressibility and flow on color Doppler imaging. Profunda Femoral Vein: No evidence of thrombus. Normal compressibility and flow on color Doppler imaging. Femoral Vein: No evidence of thrombus. Normal compressibility, respiratory phasicity and response to augmentation. Popliteal Vein: No evidence of thrombus. Normal compressibility, respiratory phasicity and response to augmentation. Calf Veins: No evidence of thrombus. Normal compressibility and flow on color Doppler imaging. IMPRESSION: Limited exam because of body habitus. No evidence of significant deep venous thrombosis in either lower extremity. Electronically Signed   By: MJerilynn Mages  Shick M.D.   On: 07/30/2020 15:44   DG CHEST PORT 1 VIEW  Result Date: 08/16/2020 CLINICAL DATA:  Cough, shortness of breath, negative COVID test EXAM: PORTABLE CHEST 1 VIEW COMPARISON:  08/12/2020 FINDINGS: Mildly prominent interstitial markings. Mild patchy right lower lobe opacity. No pleural effusion or pneumothorax. The heart is normal in size. IMPRESSION: Mild patchy right lower lobe opacity, suspicious for pneumonia. Electronically Signed   By: SJulian HyM.D.   On:  08/16/2020 07:48   DG Chest Port 1 View  Result Date: 08/12/2020 CLINICAL DATA:  Shortness of breath EXAM: PORTABLE CHEST 1 VIEW COMPARISON:  July 29, 2020 chest radiograph and chest CT FINDINGS: There is bibasilar atelectasis. No edema or airspace opacity. Heart is mildly enlarged with pulmonary vascularity normal. No adenopathy. No bone lesions. IMPRESSION: Mild bibasilar atelectasis. Lungs otherwise clear. Mild cardiac enlargement. No adenopathy. Electronically Signed   By: WLowella GripIII M.D.   On: 08/12/2020 10:54   ECHOCARDIOGRAM COMPLETE  Result Date: 07/31/2020    ECHOCARDIOGRAM REPORT   Patient Name:   Jaime EUBANKSDate of Exam: 07/31/2020 Medical Rec #:  [id number]       Height:       69.0 in Accession #:  [id number]       Weight:       302.0 lb Date of Birth:  14-Aug-1986         BSA:          2.462 m Patient Age:    34 years         BP:           140/88 mmHg Patient Gender: F                HR:           108 bpm. Exam Location:  Inpatient Procedure: 2D Echo, Cardiac Doppler and Color Doppler Indications:    CHF-Acute Diastolic 314.97 / W26.37  History:        Patient has prior history of Echocardiogram examinations, most                 recent 07/05/2019. COPD; Risk Factors:Diabetes.  Sonographer:    Bernadene Person RDCS Referring Phys: [id number] AMRIT ADHIKARI  Sonographer Comments: Technically difficult study due to poor echo windows. IMPRESSIONS  1. Left ventricular ejection fraction, by estimation, is 55 to 60%. The left ventricle has normal function. Left ventricular endocardial border not optimally defined to evaluate regional wall motion. Left ventricular diastolic parameters are indeterminate.  2. Right ventricular systolic function is normal. The right ventricular size is normal.  3. The mitral valve is grossly normal. Trivial mitral valve regurgitation.  4. The aortic valve was not well visualized. Aortic valve regurgitation is not visualized.  5. Unable to estimate CVP.  FINDINGS  Left Ventricle: Left ventricular ejection fraction, by estimation, is 55 to 60%. The left ventricle has normal function. Left ventricular endocardial border not optimally defined to evaluate regional wall motion. The left ventricular internal cavity size was normal in size. There is no left ventricular hypertrophy. Left ventricular diastolic parameters are indeterminate. Right Ventricle: The right ventricular size is normal. No increase in right ventricular wall thickness. Right ventricular systolic function is normal. Left Atrium: Left atrial size was normal in size. Right Atrium: Right atrial size was normal in size. Pericardium: There is no evidence of pericardial effusion. Presence of pericardial fat pad. Mitral Valve: The mitral valve is grossly normal. Trivial mitral valve regurgitation. Tricuspid Valve: The tricuspid valve is grossly normal. Tricuspid valve regurgitation is trivial. Aortic Valve: The aortic valve was not well visualized. Aortic valve regurgitation is not visualized. Pulmonic Valve: The pulmonic valve was not well visualized. Pulmonic valve regurgitation is not visualized. Aorta: The aortic root is normal in size and structure. Venous: Unable to estimate CVP. The inferior vena cava was not well visualized. IAS/Shunts: The interatrial septum was not well visualized.  LEFT VENTRICLE PLAX 2D LVIDd:         5.74 cm  Diastology LVIDs:         3.97 cm  LV e' medial:    8.70 cm/s LV PW:         0.82 cm  LV E/e' medial:  9.6 LV IVS:        0.78 cm  LV e' lateral:   15.20 cm/s LVOT diam:     1.90 cm  LV E/e' lateral: 5.5 LV SV:         79 LV SV Index:   32 LVOT Area:     2.84 cm  RIGHT VENTRICLE RV S prime:     10.80 cm/s TAPSE (M-mode): 2.8 cm LEFT ATRIUM  Index       RIGHT ATRIUM           Index LA diam:        3.80 cm 1.54 cm/m  RA Area:     10.90 cm LA Vol (A2C):   26.9 ml 10.93 ml/m RA Volume:   21.40 ml  8.69 ml/m LA Vol (A4C):   46.4 ml 18.85 ml/m LA Biplane Vol: 36.8  ml 14.95 ml/m  AORTIC VALVE LVOT Vmax:   155.00 cm/s LVOT Vmean:  117.000 cm/s LVOT VTI:    0.278 m  AORTA Ao Root diam: 3.10 cm MITRAL VALVE               TRICUSPID VALVE MV Area (PHT): 4.29 cm    TR Peak grad:   11.2 mmHg MV Decel Time: 177 msec    TR Vmax:        167.00 cm/s MV E velocity: 83.90 cm/s MV A velocity: 85.90 cm/s  SHUNTS MV E/A ratio:  0.98        Systemic VTI:  0.28 m                            Systemic Diam: 1.90 cm Rozann Lesches MD Electronically signed by Rozann Lesches MD Signature Date/Time: 07/31/2020/11:51:14 AM    Final    [place] ASCITES (ABDOMEN LIMITED)  Result Date: 07/30/2020 CLINICAL DATA:  Cirrhosis, ascites EXAM: LIMITED ABDOMEN ULTRASOUND FOR ASCITES TECHNIQUE: Limited ultrasound survey for ascites was performed in all four abdominal quadrants. COMPARISON:  07/29/2020 FINDINGS: Trace ascites. Insufficient ascites for paracentesis. IMPRESSION: Trace ascites, insufficient for paracentesis. Electronically Signed   By: Lavonia Dana M.D.   On: 07/30/2020 10:37   [place] ASCITES (ABDOMEN LIMITED)  Result Date: 07/29/2020 CLINICAL DATA:  Alcoholic cirrhosis, abdominal swelling EXAM: LIMITED ABDOMEN ULTRASOUND FOR ASCITES TECHNIQUE: Limited ultrasound survey for ascites was performed in all four abdominal quadrants. COMPARISON:  CT abdomen and pelvis 12/04/2019 FINDINGS: Minimal ascites identified. Insufficient ascites for paracentesis. IMPRESSION: Insufficient ascites for paracentesis. Electronically Signed   By: Lavonia Dana M.D.   On: 07/29/2020 16:57     Time coordinating discharge: Over 30 minutes    Dwyane Dee, MD  [facility] 08/17/2020, 1:03 PM
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
## 2020-08-23 ENCOUNTER — Encounter (HOSPITAL_COMMUNITY): Payer: Self-pay | Admitting: Psychiatry

## 2020-08-23 ENCOUNTER — Telehealth (INDEPENDENT_AMBULATORY_CARE_PROVIDER_SITE_OTHER): Payer: Medicaid Other | Admitting: Psychiatry

## 2020-08-23 ENCOUNTER — Other Ambulatory Visit: Payer: Self-pay

## 2020-08-23 DIAGNOSIS — F330 Major depressive disorder, recurrent, mild: Secondary | ICD-10-CM

## 2020-08-23 DIAGNOSIS — F419 Anxiety disorder, unspecified: Secondary | ICD-10-CM | POA: Diagnosis not present

## 2020-08-23 MED ORDER — QUETIAPINE FUMARATE 25 MG PO TABS
25.0000 mg | ORAL_TABLET | Freq: Every day | ORAL | 0 refills | Status: DC
Start: 2020-10-16 — End: 2021-01-21

## 2020-08-23 MED ORDER — VENLAFAXINE HCL ER 150 MG PO CP24
ORAL_CAPSULE | ORAL | 0 refills | Status: DC
Start: 2020-10-17 — End: 2021-01-24

## 2020-08-23 MED ORDER — CLONAZEPAM 0.5 MG PO TABS: 0.5000 mg | ORAL_TABLET | Freq: Two times a day (BID) | ORAL | 2 refills | Status: DC

## 2020-08-26 ENCOUNTER — Ambulatory Visit: Payer: Medicaid Other | Admitting: Nurse Practitioner

## 2020-08-31 ENCOUNTER — Other Ambulatory Visit: Payer: Self-pay

## 2020-08-31 ENCOUNTER — Inpatient Hospital Stay (HOSPITAL_COMMUNITY)
Admission: EM | Admit: 2020-08-31 | Discharge: 2020-09-09 | DRG: 637 | Disposition: A | Discharge status: Home / Self Care | Payer: Medicaid Other | Attending: Internal Medicine | Admitting: Internal Medicine

## 2020-08-31 ENCOUNTER — Emergency Department (HOSPITAL_COMMUNITY): Payer: Medicaid Other

## 2020-08-31 ENCOUNTER — Inpatient Hospital Stay (HOSPITAL_COMMUNITY): Payer: Medicaid Other

## 2020-08-31 DIAGNOSIS — Z6841 Body Mass Index (BMI) 40.0 and over, adult: Secondary | ICD-10-CM | POA: Diagnosis not present

## 2020-08-31 DIAGNOSIS — F419 Anxiety disorder, unspecified: Secondary | ICD-10-CM | POA: Diagnosis present

## 2020-08-31 DIAGNOSIS — L89151 Pressure ulcer of sacral region, stage 1: Secondary | ICD-10-CM | POA: Diagnosis present

## 2020-08-31 DIAGNOSIS — F329 Major depressive disorder, single episode, unspecified: Secondary | ICD-10-CM | POA: Diagnosis not present

## 2020-08-31 DIAGNOSIS — G8929 Other chronic pain: Secondary | ICD-10-CM | POA: Diagnosis not present

## 2020-08-31 DIAGNOSIS — J449 Chronic obstructive pulmonary disease, unspecified: Secondary | ICD-10-CM | POA: Diagnosis not present

## 2020-08-31 DIAGNOSIS — G4700 Insomnia, unspecified: Secondary | ICD-10-CM | POA: Diagnosis present

## 2020-08-31 DIAGNOSIS — R4701 Aphasia: Secondary | ICD-10-CM | POA: Diagnosis present

## 2020-08-31 DIAGNOSIS — R579 Shock, unspecified: Secondary | ICD-10-CM | POA: Diagnosis not present

## 2020-08-31 DIAGNOSIS — Z886 Allergy status to analgesic agent status: Secondary | ICD-10-CM

## 2020-08-31 DIAGNOSIS — F4321 Adjustment disorder with depressed mood: Secondary | ICD-10-CM | POA: Diagnosis present

## 2020-08-31 DIAGNOSIS — I952 Hypotension due to drugs: Secondary | ICD-10-CM | POA: Diagnosis not present

## 2020-08-31 DIAGNOSIS — Z881 Allergy status to other antibiotic agents status: Secondary | ICD-10-CM

## 2020-08-31 DIAGNOSIS — B1920 Unspecified viral hepatitis C without hepatic coma: Secondary | ICD-10-CM | POA: Diagnosis present

## 2020-08-31 DIAGNOSIS — J810 Acute pulmonary edema: Secondary | ICD-10-CM | POA: Diagnosis not present

## 2020-08-31 DIAGNOSIS — E162 Hypoglycemia, unspecified: Secondary | ICD-10-CM

## 2020-08-31 DIAGNOSIS — M329 Systemic lupus erythematosus, unspecified: Secondary | ICD-10-CM | POA: Diagnosis not present

## 2020-08-31 DIAGNOSIS — B952 Enterococcus as the cause of diseases classified elsewhere: Secondary | ICD-10-CM | POA: Diagnosis present

## 2020-08-31 DIAGNOSIS — K7581 Nonalcoholic steatohepatitis (NASH): Secondary | ICD-10-CM | POA: Diagnosis present

## 2020-08-31 DIAGNOSIS — D6959 Other secondary thrombocytopenia: Secondary | ICD-10-CM | POA: Diagnosis present

## 2020-08-31 DIAGNOSIS — Z79899 Other long term (current) drug therapy: Secondary | ICD-10-CM

## 2020-08-31 DIAGNOSIS — G9340 Encephalopathy, unspecified: Secondary | ICD-10-CM | POA: Diagnosis not present

## 2020-08-31 DIAGNOSIS — E7220 Disorder of urea cycle metabolism, unspecified: Secondary | ICD-10-CM | POA: Diagnosis present

## 2020-08-31 DIAGNOSIS — Z9049 Acquired absence of other specified parts of digestive tract: Secondary | ICD-10-CM

## 2020-08-31 DIAGNOSIS — K7460 Unspecified cirrhosis of liver: Secondary | ICD-10-CM

## 2020-08-31 DIAGNOSIS — J9600 Acute respiratory failure, unspecified whether with hypoxia or hypercapnia: Secondary | ICD-10-CM

## 2020-08-31 DIAGNOSIS — E785 Hyperlipidemia, unspecified: Secondary | ICD-10-CM | POA: Diagnosis present

## 2020-08-31 DIAGNOSIS — R188 Other ascites: Secondary | ICD-10-CM | POA: Diagnosis present

## 2020-08-31 DIAGNOSIS — Z7982 Long term (current) use of aspirin: Secondary | ICD-10-CM

## 2020-08-31 DIAGNOSIS — Z9911 Dependence on respirator [ventilator] status: Secondary | ICD-10-CM

## 2020-08-31 DIAGNOSIS — N390 Urinary tract infection, site not specified: Secondary | ICD-10-CM | POA: Diagnosis present

## 2020-08-31 DIAGNOSIS — D6489 Other specified anemias: Secondary | ICD-10-CM | POA: Diagnosis present

## 2020-08-31 DIAGNOSIS — Z888 Allergy status to other drugs, medicaments and biological substances status: Secondary | ICD-10-CM

## 2020-08-31 DIAGNOSIS — F17290 Nicotine dependence, other tobacco product, uncomplicated: Secondary | ICD-10-CM | POA: Diagnosis present

## 2020-08-31 DIAGNOSIS — R130 Aphagia: Secondary | ICD-10-CM | POA: Diagnosis not present

## 2020-08-31 DIAGNOSIS — E1165 Type 2 diabetes mellitus with hyperglycemia: Secondary | ICD-10-CM

## 2020-08-31 DIAGNOSIS — J9690 Respiratory failure, unspecified, unspecified whether with hypoxia or hypercapnia: Secondary | ICD-10-CM | POA: Diagnosis present

## 2020-08-31 DIAGNOSIS — E11649 Type 2 diabetes mellitus with hypoglycemia without coma: Principal | ICD-10-CM | POA: Diagnosis present

## 2020-08-31 DIAGNOSIS — Z793 Long term (current) use of hormonal contraceptives: Secondary | ICD-10-CM

## 2020-08-31 DIAGNOSIS — F1120 Opioid dependence, uncomplicated: Secondary | ICD-10-CM | POA: Diagnosis present

## 2020-08-31 DIAGNOSIS — R069 Unspecified abnormalities of breathing: Secondary | ICD-10-CM

## 2020-08-31 DIAGNOSIS — Z91041 Radiographic dye allergy status: Secondary | ICD-10-CM

## 2020-08-31 DIAGNOSIS — E876 Hypokalemia: Secondary | ICD-10-CM | POA: Diagnosis present

## 2020-08-31 DIAGNOSIS — G9341 Metabolic encephalopathy: Secondary | ICD-10-CM | POA: Diagnosis present

## 2020-08-31 DIAGNOSIS — Z885 Allergy status to narcotic agent status: Secondary | ICD-10-CM

## 2020-08-31 DIAGNOSIS — L8990 Pressure ulcer of unspecified site, unspecified stage: Secondary | ICD-10-CM | POA: Diagnosis present

## 2020-08-31 DIAGNOSIS — Z20822 Contact with and (suspected) exposure to covid-19: Secondary | ICD-10-CM | POA: Diagnosis present

## 2020-08-31 DIAGNOSIS — D61818 Other pancytopenia: Secondary | ICD-10-CM | POA: Diagnosis present

## 2020-08-31 DIAGNOSIS — I5033 Acute on chronic diastolic (congestive) heart failure: Secondary | ICD-10-CM | POA: Diagnosis present

## 2020-08-31 DIAGNOSIS — J9601 Acute respiratory failure with hypoxia: Secondary | ICD-10-CM | POA: Diagnosis present

## 2020-08-31 DIAGNOSIS — D638 Anemia in other chronic diseases classified elsewhere: Secondary | ICD-10-CM | POA: Diagnosis present

## 2020-08-31 DIAGNOSIS — R5381 Other malaise: Secondary | ICD-10-CM | POA: Diagnosis present

## 2020-08-31 DIAGNOSIS — B182 Chronic viral hepatitis C: Secondary | ICD-10-CM | POA: Diagnosis not present

## 2020-08-31 DIAGNOSIS — E1140 Type 2 diabetes mellitus with diabetic neuropathy, unspecified: Secondary | ICD-10-CM | POA: Diagnosis present

## 2020-08-31 DIAGNOSIS — Z794 Long term (current) use of insulin: Secondary | ICD-10-CM

## 2020-08-31 DIAGNOSIS — Z7984 Long term (current) use of oral hypoglycemic drugs: Secondary | ICD-10-CM

## 2020-08-31 DIAGNOSIS — J690 Pneumonitis due to inhalation of food and vomit: Secondary | ICD-10-CM | POA: Diagnosis not present

## 2020-08-31 DIAGNOSIS — J156 Pneumonia due to other aerobic Gram-negative bacteria: Secondary | ICD-10-CM | POA: Diagnosis not present

## 2020-08-31 HISTORY — DX: Encephalopathy, unspecified: G93.40

## 2020-08-31 LAB — RESPIRATORY PANEL BY RT PCR (FLU A&B, COVID)
Influenza A by PCR: NEGATIVE
Influenza B by PCR: NEGATIVE
SARS Coronavirus 2 by RT PCR: NEGATIVE

## 2020-08-31 LAB — CBC WITH DIFFERENTIAL/PLATELET
Abs Immature Granulocytes: 0.07 10*3/uL (ref 0.00–0.07)
Basophils Absolute: 0.1 10*3/uL (ref 0.0–0.1)
Basophils Relative: 1 %
Eosinophils Absolute: 0.1 10*3/uL (ref 0.0–0.5)
Eosinophils Relative: 1 %
HCT: 32.4 % — ABNORMAL LOW (ref 36.0–46.0)
Hemoglobin: 10.4 g/dL — ABNORMAL LOW (ref 12.0–15.0)
Immature Granulocytes: 1 %
Lymphocytes Relative: 7 %
Lymphs Abs: 0.7 10*3/uL (ref 0.7–4.0)
MCH: 29.3 pg (ref 26.0–34.0)
MCHC: 32.1 g/dL (ref 30.0–36.0)
MCV: 91.3 fL (ref 80.0–100.0)
Monocytes Absolute: 0.7 10*3/uL (ref 0.1–1.0)
Monocytes Relative: 7 %
Neutro Abs: 8.2 10*3/uL — ABNORMAL HIGH (ref 1.7–7.7)
Neutrophils Relative %: 83 %
Platelets: 137 10*3/uL — ABNORMAL LOW (ref 150–400)
RBC: 3.55 MIL/uL — ABNORMAL LOW (ref 3.87–5.11)
RDW: 18.1 % — ABNORMAL HIGH (ref 11.5–15.5)
WBC: 9.8 10*3/uL (ref 4.0–10.5)
nRBC: 0 % (ref 0.0–0.2)

## 2020-08-31 LAB — BLOOD GAS, ARTERIAL
Acid-base deficit: 1.6 mmol/L (ref 0.0–2.0)
Bicarbonate: 22.7 mmol/L (ref 20.0–28.0)
FIO2: 100
O2 Saturation: 98.7 %
Patient temperature: 37.4
pCO2 arterial: 47.1 mmHg (ref 32.0–48.0)
pH, Arterial: 7.321 — ABNORMAL LOW (ref 7.350–7.450)
pO2, Arterial: 159 mmHg — ABNORMAL HIGH (ref 83.0–108.0)

## 2020-08-31 LAB — ACETAMINOPHEN LEVEL: Acetaminophen (Tylenol), Serum: <10 ug/mL — ABNORMAL LOW (ref 10–30)

## 2020-08-31 LAB — URINALYSIS, ROUTINE W REFLEX MICROSCOPIC
Bacteria, UA: NONE SEEN
Bilirubin Urine: NEGATIVE
Glucose, UA: 50 mg/dL — AB
Ketones, ur: NEGATIVE mg/dL
Leukocytes,Ua: NEGATIVE
Nitrite: NEGATIVE
Protein, ur: 30 mg/dL — AB
Specific Gravity, Urine: 1.023 (ref 1.005–1.030)
pH: 6 (ref 5.0–8.0)

## 2020-08-31 LAB — LACTIC ACID, PLASMA: Lactic Acid, Venous: 1.1 mmol/L (ref 0.5–1.9)

## 2020-08-31 LAB — CBG MONITORING, ED
Glucose-Capillary: 165 mg/dL — ABNORMAL HIGH (ref 70–99)
Glucose-Capillary: 51 mg/dL — ABNORMAL LOW (ref 70–99)
Glucose-Capillary: 113 mg/dL — ABNORMAL HIGH (ref 70–99)
Glucose-Capillary: 67 mg/dL — ABNORMAL LOW (ref 70–99)
Glucose-Capillary: 74 mg/dL (ref 70–99)
Glucose-Capillary: 59 mg/dL — ABNORMAL LOW (ref 70–99)
Glucose-Capillary: 55 mg/dL — ABNORMAL LOW (ref 70–99)
Glucose-Capillary: 57 mg/dL — ABNORMAL LOW (ref 70–99)
Glucose-Capillary: 56 mg/dL — ABNORMAL LOW (ref 70–99)
Glucose-Capillary: 71 mg/dL (ref 70–99)
Glucose-Capillary: 77 mg/dL (ref 70–99)

## 2020-08-31 LAB — COMPREHENSIVE METABOLIC PANEL
ALT: 30 U/L (ref 0–44)
AST: 30 U/L (ref 15–41)
Albumin: 2.6 g/dL — ABNORMAL LOW (ref 3.5–5.0)
Alkaline Phosphatase: 87 U/L (ref 38–126)
Anion gap: 6 (ref 5–15)
BUN: 9 mg/dL (ref 6–20)
CO2: 24 mmol/L (ref 22–32)
Calcium: 7.8 mg/dL — ABNORMAL LOW (ref 8.9–10.3)
Chloride: 105 mmol/L (ref 98–111)
Creatinine, Ser: 0.43 mg/dL — ABNORMAL LOW (ref 0.44–1.00)
GFR, Estimated: >60 mL/min (ref >60)
Glucose, Bld: 48 mg/dL — ABNORMAL LOW (ref 70–99)
Potassium: 4.2 mmol/L (ref 3.5–5.1)
Sodium: 135 mmol/L (ref 135–145)
Total Bilirubin: 1 mg/dL (ref 0.3–1.2)
Total Protein: 6.6 g/dL (ref 6.5–8.1)

## 2020-08-31 LAB — POCT I-STAT 7, (LYTES, BLD GAS, ICA,H+H)
Acid-Base Excess: 0 mmol/L (ref 0.0–2.0)
Bicarbonate: 23.4 mmol/L (ref 20.0–28.0)
Calcium, Ion: 1.14 mmol/L — ABNORMAL LOW (ref 1.15–1.40)
HCT: 51 % — ABNORMAL HIGH (ref 36.0–46.0)
Hemoglobin: 17.3 g/dL — ABNORMAL HIGH (ref 12.0–15.0)
O2 Saturation: 96 %
Potassium: 4.2 mmol/L (ref 3.5–5.1)
Sodium: 138 mmol/L (ref 135–145)
TCO2: 24 mmol/L (ref 22–32)
pCO2 arterial: 35.1 mmHg (ref 32.0–48.0)
pH, Arterial: 7.432 (ref 7.350–7.450)
pO2, Arterial: 79 mmHg — ABNORMAL LOW (ref 83.0–108.0)

## 2020-08-31 LAB — ETHANOL: Alcohol, Ethyl (B): <10 mg/dL (ref <10)

## 2020-08-31 LAB — GLUCOSE, CAPILLARY
Glucose-Capillary: 77 mg/dL (ref 70–99)
Glucose-Capillary: 104 mg/dL — ABNORMAL HIGH (ref 70–99)
Glucose-Capillary: 130 mg/dL — ABNORMAL HIGH (ref 70–99)

## 2020-08-31 LAB — MRSA PCR SCREENING: MRSA by PCR: NEGATIVE

## 2020-08-31 LAB — TROPONIN I (HIGH SENSITIVITY)
Troponin I (High Sensitivity): 3 ng/L (ref <18)
Troponin I (High Sensitivity): 4 ng/L (ref <18)

## 2020-08-31 LAB — RAPID URINE DRUG SCREEN, HOSP PERFORMED
Amphetamines: NOT DETECTED
Barbiturates: NOT DETECTED
Benzodiazepines: NOT DETECTED
Cocaine: NOT DETECTED
Opiates: NOT DETECTED
Tetrahydrocannabinol: NOT DETECTED

## 2020-08-31 LAB — SALICYLATE LEVEL: Salicylate Lvl: <7 mg/dL — ABNORMAL LOW (ref 7.0–30.0)

## 2020-08-31 LAB — BRAIN NATRIURETIC PEPTIDE: B Natriuretic Peptide: 131 pg/mL — ABNORMAL HIGH (ref 0.0–100.0)

## 2020-08-31 LAB — AMMONIA: Ammonia: 67 umol/L — ABNORMAL HIGH (ref 9–35)

## 2020-08-31 MED ORDER — HYDROCORTISONE NA SUCCINATE PF 100 MG IJ SOLR
50.0000 mg | Freq: Four times a day (QID) | INTRAMUSCULAR | Status: DC
  Administered 2020-08-31 – 2020-09-01 (×3): 50 mg via INTRAVENOUS
  Filled 2020-08-31 (×3): qty 2

## 2020-08-31 MED ORDER — DEXTROSE 10 % IV SOLN
Freq: Once | INTRAVENOUS | Status: AC
  Administered 2020-08-31: 75 mL/h via INTRAVENOUS

## 2020-08-31 MED ORDER — NOREPINEPHRINE 4 MG/250ML-% IV SOLN: 0.0000 ug/min | INTRAVENOUS | Status: DC

## 2020-08-31 MED ORDER — DEXTROSE 10 % IV SOLN: INTRAVENOUS | Status: DC

## 2020-08-31 MED ORDER — ETOMIDATE 2 MG/ML IV SOLN
30.0000 mg | Freq: Once | INTRAVENOUS | Status: AC
  Administered 2020-08-31: 30 mg via INTRAVENOUS

## 2020-08-31 MED ORDER — SUCRALFATE 1 G PO TABS
1.0000 g | ORAL_TABLET | Freq: Four times a day (QID) | ORAL | Status: DC
  Administered 2020-08-31 – 2020-09-01 (×6): 1 g
  Filled 2020-08-31 (×8): qty 1

## 2020-08-31 MED ORDER — CHLORHEXIDINE GLUCONATE CLOTH 2 % EX PADS
6.0000 | MEDICATED_PAD | Freq: Every day | CUTANEOUS | Status: DC
  Administered 2020-09-01 – 2020-09-09 (×8): 6 via TOPICAL

## 2020-08-31 MED ORDER — MIDAZOLAM HCL 2 MG/2ML IJ SOLN
2.0000 mg | INTRAMUSCULAR | Status: DC | PRN
  Administered 2020-08-31 (×2): 2 mg via INTRAVENOUS
  Filled 2020-08-31 (×3): qty 2

## 2020-08-31 MED ORDER — NALOXONE HCL 2 MG/2ML IJ SOSY
PREFILLED_SYRINGE | INTRAMUSCULAR | Status: AC
  Administered 2020-08-31: 1 mg via INTRAVENOUS
  Filled 2020-08-31: qty 2

## 2020-08-31 MED ORDER — DOCUSATE SODIUM 100 MG PO CAPS: 100.0000 mg | ORAL_CAPSULE | Freq: Two times a day (BID) | ORAL | Status: DC | PRN

## 2020-08-31 MED ORDER — SODIUM CHLORIDE 0.9 % IV SOLN
2.0000 g | Freq: Once | INTRAVENOUS | Status: DC
  Filled 2020-08-31: qty 2

## 2020-08-31 MED ORDER — NOREPINEPHRINE 4 MG/250ML-% IV SOLN
INTRAVENOUS | Status: AC
  Administered 2020-08-31: 4 mg
  Filled 2020-08-31: qty 250

## 2020-08-31 MED ORDER — DEXTROSE 50 % IV SOLN: 25.0000 g | Freq: Once | INTRAVENOUS | Status: AC

## 2020-08-31 MED ORDER — MIDAZOLAM HCL 2 MG/2ML IJ SOLN
2.0000 mg | INTRAMUSCULAR | Status: AC | PRN
  Administered 2020-08-31 (×3): 2 mg via INTRAVENOUS
  Filled 2020-08-31 (×2): qty 2

## 2020-08-31 MED ORDER — PROPOFOL 1000 MG/100ML IV EMUL
0.0000 ug/kg/min | INTRAVENOUS | Status: DC
  Administered 2020-08-31: 10 ug/kg/min via INTRAVENOUS

## 2020-08-31 MED ORDER — FENTANYL CITRATE (PF) 100 MCG/2ML IJ SOLN
100.0000 ug | INTRAMUSCULAR | Status: DC | PRN
  Administered 2020-08-31 – 2020-09-02 (×6): 100 ug via INTRAVENOUS
  Filled 2020-08-31 (×8): qty 2

## 2020-08-31 MED ORDER — LACTULOSE 10 GM/15ML PO SOLN
30.0000 g | Freq: Two times a day (BID) | ORAL | Status: DC
  Administered 2020-08-31 – 2020-09-01 (×3): 30 g
  Filled 2020-08-31 (×3): qty 45

## 2020-08-31 MED ORDER — ROCURONIUM BROMIDE 50 MG/5ML IV SOLN
1.0000 mg/kg | Freq: Once | INTRAVENOUS | Status: AC
  Administered 2020-08-31: 120 mg via INTRAVENOUS

## 2020-08-31 MED ORDER — FENTANYL CITRATE (PF) 100 MCG/2ML IJ SOLN
100.0000 ug | INTRAMUSCULAR | Status: AC | PRN
  Administered 2020-08-31 (×3): 100 ug via INTRAVENOUS
  Filled 2020-08-31 (×2): qty 2

## 2020-08-31 MED ORDER — NALOXONE HCL 2 MG/2ML IJ SOSY: 1.0000 mg | PREFILLED_SYRINGE | Freq: Once | INTRAMUSCULAR | Status: AC

## 2020-08-31 MED ORDER — PANTOPRAZOLE SODIUM 40 MG IV SOLR
40.0000 mg | Freq: Every day | INTRAVENOUS | Status: DC
  Administered 2020-08-31: 40 mg via INTRAVENOUS
  Filled 2020-08-31: qty 40

## 2020-08-31 MED ORDER — NALOXONE HCL 2 MG/2ML IJ SOSY
1.0000 mg | PREFILLED_SYRINGE | Freq: Once | INTRAMUSCULAR | Status: AC
  Administered 2020-08-31: 1 mg via INTRAVENOUS

## 2020-08-31 MED ORDER — PROPOFOL 1000 MG/100ML IV EMUL
0.0000 ug/kg/min | INTRAVENOUS | Status: DC
  Administered 2020-08-31: 49 ug/kg/min via INTRAVENOUS
  Administered 2020-08-31: 47 ug/kg/min via INTRAVENOUS
  Administered 2020-08-31: 10 ug/kg/min via INTRAVENOUS
  Administered 2020-09-01 (×2): 35 ug/kg/min via INTRAVENOUS
  Filled 2020-08-31 (×5): qty 100

## 2020-08-31 MED ORDER — SODIUM CHLORIDE 0.9 % IV SOLN
2.0000 g | Freq: Three times a day (TID) | INTRAVENOUS | Status: DC
  Administered 2020-08-31 – 2020-09-02 (×5): 2 g via INTRAVENOUS
  Filled 2020-08-31 (×4): qty 2

## 2020-08-31 MED ORDER — SODIUM CHLORIDE 0.9 % IV SOLN: INTRAVENOUS | Status: DC

## 2020-08-31 MED ORDER — NALOXONE HCL 2 MG/2ML IJ SOSY
2.0000 mg | PREFILLED_SYRINGE | Freq: Once | INTRAMUSCULAR | Status: AC
  Administered 2020-08-31: 2 mg via INTRAVENOUS

## 2020-08-31 MED ORDER — POLYETHYLENE GLYCOL 3350 17 G PO PACK
17.0000 g | PACK | Freq: Every day | ORAL | Status: DC | PRN
  Administered 2020-09-02: 17 g via ORAL

## 2020-08-31 MED ORDER — NALOXONE HCL 2 MG/2ML IJ SOSY
PREFILLED_SYRINGE | INTRAMUSCULAR | Status: AC
  Filled 2020-08-31: qty 2

## 2020-08-31 MED ORDER — LINEZOLID 600 MG/300ML IV SOLN
600.0000 mg | Freq: Two times a day (BID) | INTRAVENOUS | Status: DC
  Administered 2020-08-31 – 2020-09-01 (×2): 600 mg via INTRAVENOUS
  Filled 2020-08-31 (×2): qty 300

## 2020-08-31 MED ORDER — HEPARIN SODIUM (PORCINE) 5000 UNIT/ML IJ SOLN
5000.0000 [IU] | Freq: Three times a day (TID) | INTRAMUSCULAR | Status: DC
  Administered 2020-08-31 – 2020-09-01 (×2): 5000 [IU] via SUBCUTANEOUS
  Filled 2020-08-31 (×2): qty 1

## 2020-08-31 MED ORDER — DEXTROSE 50 % IV SOLN
INTRAVENOUS | Status: AC
  Administered 2020-08-31: 25 g via INTRAVENOUS
  Filled 2020-08-31: qty 50

## 2020-08-31 MED ORDER — PROPOFOL 1000 MG/100ML IV EMUL
INTRAVENOUS | Status: AC
  Filled 2020-08-31: qty 100

## 2020-08-31 NOTE — ED Notes (Signed)
Report given to [facility]  

## 2020-08-31 NOTE — Progress Notes (Signed)
Stat  EEG complete - results pending.

## 2020-08-31 NOTE — ED Notes (Addendum)
7.5 ett. 21 at the lip

## 2020-08-31 NOTE — Progress Notes (Signed)
Pharmacy Antibiotic Note  Jaime [name] is a 34 y.o. female admitted on 08/31/2020 with pneumonia.  Pharmacy has been consulted for cefepime dosing.  Originally consulted for vancomycin as well but patient with listed swelling reaction to vancomycin. Patient is intubated and mother did not answer phone call. After discussion with MD, changed to linezolid. Good renal function. CXR with patchy opacities. Possible aspiration during AMS. WBC wnl. Afebrile.   Plan: Cefepime 2g Q8 hr Linezolid IV 655m Q12 hr Monitor cultures, clinical status, renal fx Narrow abx as able and f/u duration    Height: 5' 9"  (175.3 cm) Weight: 130 kg (286 lb 9.6 oz) IBW/kg (Calculated) : 66.2  Temp (24hrs), Avg:99.5 F (37.5 C), Min:99.3 F (37.4 C), Max:99.9 F (37.7 C)  Recent Labs  Lab 08/31/20 0916 08/31/20 0921  WBC  --  9.8  CREATININE  --  0.43*  LATICACIDVEN 1.1  --     Estimated Creatinine Clearance: 140.7 mL/min (A) (by C-G formula based on SCr of 0.43 mg/dL (L)).    Allergies  Allergen Reactions  . Iodine-131 Anaphylaxis, Shortness Of Breath and Swelling  . Ivp Dye [Iodinated Diagnostic Agents] Anaphylaxis    Skin gets very red, unable to walk   . Ketorolac Tromethamine Shortness Of Breath  . Tylenol [Acetaminophen] Other (See Comments)    Due to cirrhosis   . Gabapentin   . Morphine And Related     Pt says she is not allergic to Morphine 08/12/20  . Nsaids Other (See Comments)    Flares ulcers  . Suboxone [Buprenorphine Hcl-Naloxone Hcl]   . Vancomycin Swelling    Facial swelling,     Antimicrobials this admission: Cefe 10/30 >>  Linezolid 10/30 >>    Microbiology results: 10/30 BCx:  10/30 Sputum:   10/30 MRSA PCR:   Thank you for allowing pharmacy to be a part of this patient's care.  LBenetta Spar PharmD, BCPS, BCCP Clinical Pharmacist  Please check AMION for all MD[place] numbers After 10:00 PM, call [phone]

## 2020-08-31 NOTE — ED Triage Notes (Signed)
Pt from home via [facility]. Pt was found unresponsive by family with diner from the previous night still in front of her per EMS. Pt BS was 38 upon Ems arrival, pt was given glucagon and BS was 58 after.

## 2020-08-31 NOTE — Progress Notes (Signed)
Patient came from an OSH intubated with a 7.5 ETT taped at 23 cm at the lip, placed on above vent settings per ARDS net protocol.

## 2020-08-31 NOTE — ED Provider Notes (Signed)
[facility] EMERGENCY DEPARTMENT Provider Note   CSN: [id number] Arrival date & time: 08/31/20  2671  LEVEL 5 CAVEAT - UNRESPONSIVE History Chief Complaint  Patient presents with  . Hypoglycemia    Jaime [name] is a 34 y.o. female.  HPI 33 year old female presents unresponsive.  History is primarily from EMS.  The patient has been at home alone as parents went to the beach.  Patient then was found by family today unresponsive on a chair.  Dinner from last night was still in front of her.  Initial glucose was in the 30s for EMS and then came up to 50s with glucagon but no IV access was able to be obtained and she still did not wake up.  Further history is unavailable.   Past Medical History:  Diagnosis Date  . Anxiety   . Asthma   . Chronic abdominal pain   . Chronic back pain   . Cirrhosis ([place])   . Cirrhosis ([place])   . COPD (chronic obstructive pulmonary disease) ([place])   . Depression   . Diabetes mellitus without complication ([place])   . Diabetes mellitus, type II ([place])   . Hepatitis C   . Insomnia   . Long-term current use of methadone for opiate dependence ([place])   . Lupus ([place])   . Migraine headache   . Neuropathy   . Nocturnal seizures ([place])   . Peptic ulcer   . Spleen enlarged     Patient Active Problem List   Diagnosis Date Noted  . Respiratory failure ([place]) 08/31/2020  . Hyponatremia 08/16/2020  . Volume overload 08/14/2020  . Dyspnea 07/29/2020  . Nexplanon insertion 05/20/2020  . Nexplanon removal 05/20/2020  . Pressure injury of skin 01/29/2019  . Uncontrolled diabetes mellitus ([place]) 09/19/2018  . Hyperglycemia 09/17/2018  . Acute respiratory failure with hypoxia ([place]) 09/15/2018  . Anxiety 09/15/2018  . Chronic abdominal pain 09/15/2018  . Diabetes mellitus without complication ([place]) 24/58/0998  . Hepatitis C 09/15/2018  . Peptic ulcer 09/15/2018  . Long-term current use of methadone for opiate dependence ([place]) 09/15/2018  . Cirrhosis ([place])  09/15/2018  . Acute hypoxemic respiratory failure ([place]) 07/17/2016  . Pleural effusion on right 07/17/2016  . Multiple rib fractures involving four or more ribs 07/17/2016  . Hemothorax on right 07/17/2016  . Chronic pain disorder 07/17/2016    Past Surgical History:  Procedure Laterality Date  . CHOLECYSTECTOMY       OB History    Gravida  0   Para  0   Term  0   Preterm  0   AB  0   Living  0     SAB  0   TAB  0   Ectopic  0   Multiple  0   Live Births  0           Family History  Problem Relation Age of Onset  . Hypertension Mother   . Hyperlipidemia Mother   . Hyperlipidemia Maternal Grandfather     Social History   Tobacco Use  . Smoking status: Current Every Day Smoker    Packs/day: 0.00    Years: 5.00    Pack years: 0.00    Types: E-cigarettes  . Smokeless tobacco: Never Used  Vaping Use  . Vaping Use: Every day  . Substances: Nicotine, Flavoring  Substance Use Topics  . Alcohol use: No  . Drug use: No    Home Medications Prior to Admission medications   Medication Sig  Start Date End Date Taking? Authorizing Provider  Accu-Chek Softclix Lancets lancets 4 (four) times daily. 07/07/20   [provider]  albuterol (ACCUNEB) 0.63 MG/3ML nebulizer solution Take by nebulization 4 (four) times daily. 07/27/20   [provider]  albuterol (PROVENTIL) (2.5 MG/3ML) 0.083% nebulizer solution Take 2.5 mg by nebulization every 6 (six) hours as needed for wheezing or shortness of breath.    [provider]  aspirin EC 81 MG tablet Take 162 mg by mouth daily. Swallow whole.    [provider]  cetirizine (ZYRTEC ALLERGY) 10 MG tablet Take 1 tablet (10 mg total) by mouth daily. 07/09/20   Avegno, Darrelyn Hillock, FNP  clonazePAM (KLONOPIN) 0.5 MG tablet Take 1 tablet (0.5 mg total) by mouth 2 (two) times daily. 09/06/20 12/05/20  Norman Clay, MD  Continuous Blood Gluc Sensor (DEXCOM G6 SENSOR) MISC 4 Pieces by Does not apply  route once a week. 07/22/20   Brita Romp, NP  Continuous Blood Gluc Transmit (DEXCOM G6 TRANSMITTER) MISC 1 Piece by Does not apply route as directed. 07/22/20   Brita Romp, NP  cyclobenzaprine (FLEXERIL) 10 MG tablet Take 1 tablet (10 mg total) by mouth at bedtime. Patient taking differently: Take 10 mg by mouth 2 (two) times daily at 8 am and 10 pm.  06/13/20   Wurst, [place], PA-C  dicyclomine (BENTYL) 10 MG capsule Take 10 mg by mouth 3 (three) times daily as needed for spasms.  07/25/20   [provider]  docusate sodium (COLACE) 100 MG capsule Take 100 mg by mouth 4 (four) times daily.    [provider]  etonogestrel (NEXPLANON) 68 MG IMPL implant 1 each by Subdermal route once.    [provider]  fluconazole (DIFLUCAN) 200 MG tablet Take 400 mg by mouth daily. 06/18/20   [provider]  fluticasone (FLONASE) 50 MCG/ACT nasal spray Place 1 spray into both nostrils daily for 14 days. 07/09/20 08/12/20  Avegno, Darrelyn Hillock, FNP  furosemide (LASIX) 40 MG tablet Take 2 tablets by mouth daily.  05/22/20   [provider]  glucose blood (ACCU-CHEK GUIDE) test strip Use as instructed to check blood glucose four times daily 06/07/20   Cassandria Anger, MD  hyoscyamine (ANASPAZ) 0.125 MG TBDP disintergrating tablet Place 0.125 mg under the tongue 3 (three) times daily as needed for bladder spasms or cramping (Patient Preference).  08/05/20   [provider]  insulin isophane & regular human (HUMULIN 70/30 MIX) (70-30) 100 UNIT/ML KwikPen Inject 90 units with breakfast and 80 units with supper 08/06/20   Brita Romp, NP  Insulin Pen Needle (PEN NEEDLES) 32G X 6 MM MISC 1 each by Does not apply route 3 (three) times daily. 07/23/20   Cassandria Anger, MD  liraglutide (VICTOZA) 18 MG/3ML SOPN Inject 1.8 mg into the skin daily. 07/22/20   Brita Romp, NP  lisinopril (ZESTRIL) 5 MG tablet Take 1 tablet (5 mg total) by mouth  daily. 06/06/20   Brita Romp, NP  metFORMIN (GLUCOPHAGE) 1000 MG tablet Take 1 tablet (1,000 mg total) by mouth 2 (two) times daily with a meal. 07/22/20   Brita Romp, NP  methadone (DOLOPHINE) 10 MG/5ML solution Take 30 mg by mouth every morning.     [provider]  Multiple Vitamin (MULTIVITAMIN) tablet Take 1 tablet by mouth daily.    [provider]  nystatin (MYCOSTATIN) 100000 UNIT/ML suspension Take 5 mLs (500,000 Units total) by mouth  4 (four) times daily. 06/13/20   Wurst, [place], PA-C  ondansetron (ZOFRAN-ODT) 4 MG disintegrating tablet Take 4 mg by mouth every 8 (eight) hours as needed for nausea.  06/14/20   [provider]  pantoprazole (PROTONIX) 40 MG tablet Take 40 mg by mouth 2 (two) times daily. 07/05/20   [provider]  potassium chloride (KLOR-CON) 10 MEQ tablet Take 2 tablets by mouth daily.  04/24/20   [provider]  pregabalin (LYRICA) 75 MG capsule Take 75 mg by mouth in the morning, at noon, in the evening, and at bedtime.     [provider]  propranolol (INDERAL) 10 MG tablet Take 10 mg by mouth 3 (three) times daily. 08/08/20   [provider]  psyllium (REGULOID) 0.52 g capsule Take 0.52 g by mouth 4 (four) times daily.    [provider]  QUEtiapine (SEROQUEL) 25 MG tablet Take 1 tablet (25 mg total) by mouth at bedtime. 10/16/20   Norman Clay, MD  RESTASIS 0.05 % ophthalmic emulsion Place 1 drop into both eyes as needed (dry eye).  04/22/19   [provider]  rosuvastatin (CRESTOR) 5 MG tablet Take 1 tablet (5 mg total) by mouth daily. 08/06/20   Brita Romp, NP  spironolactone (ALDACTONE) 100 MG tablet Take 200 mg by mouth daily.  05/22/20   [provider]  sucralfate (CARAFATE) 1 g tablet Take 1 g by mouth 4 (four) times daily. 07/02/20   [provider]  venlafaxine XR (EFFEXOR-XR) 150 MG 24 hr capsule Take total of 225 mg daily (150 mg + 75 mg )  10/17/20   Norman Clay, MD  venlafaxine XR (EFFEXOR-XR) 75 MG 24 hr capsule Take 75 mg by mouth daily. To take along with 183m for a total of 225 mg daily    [provider]    Allergies    Iodine-131, Ivp dye [iodinated diagnostic agents], Ketorolac tromethamine, Tylenol [acetaminophen], Gabapentin, Morphine and related, Nsaids, Suboxone [buprenorphine hcl-naloxone hcl], and Vancomycin  Review of Systems   Review of Systems  Unable to perform ROS: Patient unresponsive    Physical Exam Updated Vital Signs BP (!) 110/46   Pulse (!) 114   Temp 99.5 F (37.5 C)   Resp (!) 26   Ht 5' 9"  (1.753 m)   Wt 130 kg   SpO2 100%   BMI 42.32 kg/m   Physical Exam Vitals and nursing note reviewed.  Constitutional:      Appearance: She is well-developed. She is obese.  HENT:     Head: Normocephalic and atraumatic.     Right Ear: External ear normal.     Left Ear: External ear normal.     Nose: Nose normal.  Eyes:     General:        Right eye: No discharge.        Left eye: No discharge.  Cardiovascular:     Rate and Rhythm: Normal rate and regular rhythm.     Heart sounds: Normal heart sounds.  Pulmonary:     Effort: Pulmonary effort is normal.     Breath sounds: Wheezing and rhonchi present.  Abdominal:     Palpations: Abdomen is soft.  Musculoskeletal:     Right lower leg: Edema present.     Left lower leg: Edema present.  Skin:    General: Skin is warm and dry.  Neurological:     Mental Status: She is unresponsive.     GCS: GCS eye  subscore is 1. GCS verbal subscore is 1. GCS motor subscore is 1.     Comments: Initially completely unresponsive. Breathing on her own. After glucose and narcan, now responds to pain, and moves but not to commands. Responds to localized pain in each extremity but still not awake  Psychiatric:        Mood and Affect: Mood is not anxious.     ED Results / Procedures / Treatments   Labs (all labs ordered are listed, but only  abnormal results are displayed) Labs Reviewed  COMPREHENSIVE METABOLIC PANEL - Abnormal; Notable for the following components:      Result Value   Glucose, Bld 48 (*)    Creatinine, Ser 0.43 (*)    Calcium 7.8 (*)    Albumin 2.6 (*)    All other components within normal limits  CBC WITH DIFFERENTIAL/PLATELET - Abnormal; Notable for the following components:   RBC 3.55 (*)    Hemoglobin 10.4 (*)    HCT 32.4 (*)    RDW 18.1 (*)    Platelets 137 (*)    Neutro Abs 8.2 (*)    All other components within normal limits  URINALYSIS, ROUTINE W REFLEX MICROSCOPIC - Abnormal; Notable for the following components:   Color, Urine AMBER (*)    APPearance HAZY (*)    Glucose, UA 50 (*)    Hgb urine dipstick SMALL (*)    Protein, ur 30 (*)    Non Squamous Epithelial 0-5 (*)    All other components within normal limits  ACETAMINOPHEN LEVEL - Abnormal; Notable for the following components:   Acetaminophen (Tylenol), Serum <10 (*)    All other components within normal limits  SALICYLATE LEVEL - Abnormal; Notable for the following components:   Salicylate Lvl <8.3 (*)    All other components within normal limits  BRAIN NATRIURETIC PEPTIDE - Abnormal; Notable for the following components:   B Natriuretic Peptide 131.0 (*)    All other components within normal limits  BLOOD GAS, ARTERIAL - Abnormal; Notable for the following components:   pH, Arterial 7.321 (*)    pO2, Arterial 159 (*)    All other components within normal limits  AMMONIA - Abnormal; Notable for the following components:   Ammonia 67 (*)    All other components within normal limits  CBG MONITORING, ED - Abnormal; Notable for the following components:   Glucose-Capillary 51 (*)    All other components within normal limits  CBG MONITORING, ED - Abnormal; Notable for the following components:   Glucose-Capillary 165 (*)    All other components within normal limits  CBG MONITORING, ED - Abnormal; Notable for the following  components:   Glucose-Capillary 113 (*)    All other components within normal limits  CBG MONITORING, ED - Abnormal; Notable for the following components:   Glucose-Capillary 67 (*)    All other components within normal limits  CBG MONITORING, ED - Abnormal; Notable for the following components:   Glucose-Capillary 59 (*)    All other components within normal limits  CBG MONITORING, ED - Abnormal; Notable for the following components:   Glucose-Capillary 55 (*)    All other components within normal limits  CBG MONITORING, ED - Abnormal; Notable for the following components:   Glucose-Capillary 57 (*)    All other components within normal limits  CBG MONITORING, ED - Abnormal; Notable for the following components:   Glucose-Capillary 56 (*)    All other components within normal limits  CULTURE, BLOOD (ROUTINE X 2)  RESPIRATORY PANEL BY RT PCR (FLU A&B, COVID)  URINE CULTURE  ETHANOL  RAPID URINE DRUG SCREEN, HOSP PERFORMED  LACTIC ACID, PLASMA  CBG MONITORING, ED  TROPONIN I (HIGH SENSITIVITY)  TROPONIN I (HIGH SENSITIVITY)    EKG EKG Interpretation  Date/Time:  Saturday August 31 2020 09:20:47 EDT Ventricular Rate:  88 PR Interval:    QRS Duration: 88 QT Interval:  360 QTC Calculation: 436 R Axis:   41 Text Interpretation: Normal sinus rhythm Ventricular premature complex Baseline wander in lead(s) V6 Confirmed by Sherwood Gambler [phone]) on 08/31/2020 10:26:54 AM   Radiology CT Head Wo Contrast  Result Date: 08/31/2020 CLINICAL DATA:  Delirium.  Patient found unresponsive by family EXAM: CT HEAD WITHOUT CONTRAST TECHNIQUE: Contiguous axial images were obtained from the base of the skull through the vertex without intravenous contrast. COMPARISON:  08/24/2019 FINDINGS: Brain: No evidence of acute infarction, hemorrhage, hydrocephalus, extra-axial collection or mass lesion/mass effect. Vascular: No hyperdense vessel or unexpected calcification. Skull: Normal. Negative for  fracture or focal lesion. Sinuses/Orbits: LEFT mastoid effusion. Other: Study quality is degraded by patient motion artifact. IMPRESSION: 1. No evidence for acute intracranial abnormality. 2. LEFT mastoid effusion. Electronically Signed   By: Nolon Nations M.D.   On: 08/31/2020 11:09   DG Chest Portable 1 View  Result Date: 08/31/2020 CLINICAL DATA:  34 year old with a history of unresponsiveness EXAM: PORTABLE CHEST 1 VIEW COMPARISON:  08/17/2019 FINDINGS: Low lung volumes. Cardiac diameter and cardiomediastinal silhouette projects enlarged. Endotracheal tube terminates 4.7 cm above the carina. Gastric tube projects over the mediastinum terminating out of the field of view. Patchy opacities bilateral lungs partially obscuring the heart borders and hemidiaphragm. Interlobular septal thickening. IMPRESSION: Low lung volumes with interlobular septal thickening and patchy opacities, with the differential including edema, multifocal pneumonia, and/or sequela of aspiration/aspiration pneumonitis. Endotracheal tube terminates suitably above the carina. Gastric tube in place. Electronically Signed   By: Corrie Mckusick D.O.   On: 08/31/2020 10:36    Procedures Procedure Name: Intubation Date/Time: 08/31/2020 9:50 AM Performed by: Sherwood Gambler, MD Pre-anesthesia Checklist: Patient identified, Patient being monitored, Emergency Drugs available, Timeout performed and Suction available Oxygen Delivery Method: Non-rebreather mask Preoxygenation: Pre-oxygenation with 100% oxygen Induction Type: Rapid sequence Ventilation: Two handed mask ventilation required Laryngoscope Size: Glidescope and 3 Grade View: Grade II Tube size: 7.5 mm Number of attempts: 1 Airway Equipment and Method: Video-laryngoscopy Placement Confirmation: ETT inserted through vocal cords under direct vision,  CO2 detector and Breath sounds checked- equal and bilateral Secured at: 21 cm Tube secured with: ETT holder Dental Injury:  Teeth and Oropharynx as per pre-operative assessment  Difficulty Due To: Difficulty was anticipated, Difficult Airway- due to large tongue and Difficult Airway- due to anterior larynx    Ultrasound ED Peripheral IV (Provider)  Date/Time: 08/31/2020 10:27 AM Performed by: Sherwood Gambler, MD Authorized by: Sherwood Gambler, MD   Procedure details:    Indications: multiple failed IV attempts and poor IV access     Skin Prep: chlorhexidine gluconate     Location:  Right AC   Angiocath:  18 G   Bedside Ultrasound Guided: Yes     Patient tolerated procedure without complications: Yes     Dressing applied: Yes   Ultrasound ED Peripheral IV (Provider)  Date/Time: 08/31/2020 10:27 AM Performed by: Sherwood Gambler, MD Authorized by: Sherwood Gambler, MD   Procedure details:    Indications: multiple failed IV attempts     Skin Prep:  chlorhexidine gluconate     Location:  Left AC   Angiocath:  18 G   Bedside Ultrasound Guided: Yes     Patient tolerated procedure without complications: Yes     Dressing applied: Yes   .Critical Care Performed by: Sherwood Gambler, MD Authorized by: Sherwood Gambler, MD   Critical care provider statement:    Critical care time (minutes):  65   Critical care time was exclusive of:  Separately billable procedures and treating other patients   Critical care was necessary to treat or prevent imminent or life-threatening deterioration of the following conditions:  Respiratory failure and CNS failure or compromise   Critical care was time spent personally by me on the following activities:  Discussions with consultants, evaluation of patient's response to treatment, examination of patient, ordering and performing treatments and interventions, ordering and review of laboratory studies, ordering and review of radiographic studies, pulse oximetry, re-evaluation of patient's condition, obtaining history from patient or surrogate and review of old charts   (including  critical care time)  Medications Ordered in ED Medications  propofol (DIPRIVAN) 1000 MG/100ML infusion (47 mcg/kg/min  130 kg Intravenous Rate/Dose Change 08/31/20 1537)  fentaNYL (SUBLIMAZE) injection 100 mcg (100 mcg Intravenous Given 08/31/20 1512)  midazolam (VERSED) injection 2 mg (2 mg Intravenous Given 08/31/20 1512)  dextrose 10 % infusion ( Intravenous Rate/Dose Change 08/31/20 1430)  dextrose 50 % solution 25 g (25 g Intravenous Given 08/31/20 0923)  naloxone (NARCAN) injection 2 mg (0 mg Intravenous Hold 08/31/20 0930)  naloxone (NARCAN) injection 1 mg (1 mg Intravenous Given 08/31/20 0928)  naloxone (NARCAN) injection 1 mg (1 mg Intravenous Given 08/31/20 0928)  etomidate (AMIDATE) injection 30 mg (30 mg Intravenous Given 08/31/20 0937)  rocuronium (ZEMURON) injection 1 mg/kg (120 mg Intravenous Given 08/31/20 0937)  dextrose 10 % infusion (0 mLs Intravenous Stopped 08/31/20 1403)  fentaNYL (SUBLIMAZE) injection 100 mcg (100 mcg Intravenous Given 08/31/20 1223)  midazolam (VERSED) injection 2 mg (2 mg Intravenous Given 08/31/20 1223)    ED Course  I have reviewed the triage vital signs and the nursing notes.  Pertinent labs & imaging results that were available during my care of the patient were reviewed by me and considered in my medical decision making (see chart for details).    MDM Rules/Calculators/A&P                          Patient presents with respiratory failure.  She is currently requiring oxygen and is unresponsive.  Giving her glucose raised her glucose level from 50 to above 100 but her mental status did not change.  She is noted to be on methadone and was given 2 mg Narcan which did seem to make her start moving around a little but not purposefully and she still seem to be having a tenuous airway.  2 more milligrams were given and now she is having increased respirations but not waking up and doing purposeful movements.  Seem like her O2 sats were going down  as well and so decision made to intubate.  I am worried she has has had some sort of CNS injury from prolonged hypoglycemia, unclear how long.  Her mental status was likely multifactorial.  I did update her mom.  At this point there is no obvious sign of an infection though blood cultures were sent.  She is on a D10 drip.  I discussed with Marni Griffon of critical care who accepts in  transfer and admission to [facility] where she can get neurology help such as EEG if needed. Final Clinical Impression(s) / ED Diagnoses Final diagnoses:  Acute respiratory failure, unspecified whether with hypoxia or hypercapnia ([place])  Hypoglycemia    Rx / DC Orders ED Discharge Orders    None       Sherwood Gambler, MD 08/31/20 1558

## 2020-08-31 NOTE — Procedures (Signed)
Patient Name: Jaime [name]  MRN: [id number]  Epilepsy Attending: Lora Havens  Referring Physician/Provider: Salvadore Dom, NP Date: 08/31/2020 Duration: 25.12 mins  Patient history: 34yo F with ams. EEG to evaluate for seizure.  Level of alertness: comatose  AEDs during EEG study: Propofol, Versed  Technical aspects: This EEG study was done with scalp electrodes positioned according to the 10-20 International system of electrode placement. Electrical activity was acquired at a sampling rate of 500Hz  and reviewed with a high frequency filter of 70Hz  and a low frequency filter of 1Hz . EEG data were recorded continuously and digitally stored.   Description: EEG showed continuous generalized 3 to 6 Hz theta-delta slowing admixed with generalized 13-15Hz  beta activity.  Hyperventilation and photic stimulation were not performed.     ABNORMALITY -Continuous slow, generalized  IMPRESSION: This study is suggestive of severe diffuse encephalopathy, nonspecific etiology but likely related to sedation. No seizures or epileptiform discharges were seen throughout the recording.  Jalesha Plotz [name]

## 2020-08-31 NOTE — Care Plan (Signed)
Grandfather updated at bedside.  Julian Hy, DO 08/31/20 7:23 PM [facility]

## 2020-08-31 NOTE — Progress Notes (Signed)
[facility] Progress Note Patient Name: Jaime [name] DOB: 03-12-1986 MRN: [id number]   Date of Service  08/31/2020  HPI/Events of Note  Received request to re-order norepinephrine that is currently running  eICU Interventions  Norepinephrine added     Intervention Category Intermediate Interventions: Other:  Judd Lien 08/31/2020, 8:05 PM

## 2020-08-31 NOTE — H&P (Signed)
NAME:  Jaime [name], MRN:  [id number], DOB:  [birth year], LOS: 0 ADMISSION DATE:  08/31/2020, CONSULTATION DATE:  10/31 REFERRING MD:  Regenia Skeeter, CHIEF COMPLAINT:  Acute metabolic vs toxic encephalopathy    Brief History   34 year old female w/ hx as per below. Found by family unresponsive. Glucose in 30s. No sig response clinically to Narcan or glucose. Intubated for airway protection. Sent to [facility] for further Eval from [facility] ER.   History of present illness   34 year old female w/ hx as outlined below. Presented to the ER after being found by family unresponsive in a chair (still had dinner from prior night in front of her). EMS noted blood sugar 38 on arrival->58 after given glucagon. This did not result in improved mentation. On arrival to ER was initially completely unresponsive but spontaneously breathing. Given glucose and Narcan. This resulted in her then responding to pain and moving spontaneously but would not follow commands. 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 The left ventricle has normal function.Left ventricular  diastolic parameters are Indeterminate. RV, RA size and RV fxn nml.  CT head 10/30: negative  Micro Data:  BCX2 10/30>>> UC 10/30>>> SARS2: neg  Antimicrobials:  vanc 10/30 Cefepime 10/30 Interim history/subjective:  Remains sedated on  propofol- nonresponsive with carelink during transport.  Objective   Blood pressure (Abnormal) 108/59, pulse (Abnormal) 112, temperature 99.5 F (37.5 C), resp. rate (Abnormal) 23, height 5' 9"  (1.753 m), weight 130 kg, SpO2 99 %.    Vent Mode: PRVC FiO2 (%):  [100 %] 100 % Set Rate:  [20 bmp-22 bmp] 20 bmp Vt Set:  [530 mL] 530 mL PEEP:  [5 cmH20] 5 cmH20 Plateau Pressure:  [15 cmH20-32 cmH20] 15 cmH20  No intake or output data in the 24 hours ending 08/31/20 1344 Filed Weights   08/31/20 0945  Weight: 130 kg    Examination: General: critically ill appearing woman laying in bed intubated, sedated HENT: [place]/AT, eyes anicteric Lungs: rhales bilaterally, no wheezing, breathing synchronously with the vent. Cardiovascular: tachycardic, regular rhythm, no murmur Abdomen: obese, soft, NT Extremities: pitting edema to thighs,  Neuro: RASS -5, not responsive to painful stimulation GU: foley draining amber urine Derm: multiple tattoos, no erythematous lesions or wounds. No petechiae.   Bedside [place] no ascites CXR: reduced lung volumes, cardiomegaly, pulmonary infiltrates, ETT 4.5cm above carina  Resolved Hospital Problem list     Assessment & Plan:  Acute metabolic/toxic encephalopathy: etiology not clear. Certainly hypoglycemia could be contributing. This would also raise concern for seizure or post-ictal state. UDS was neg but this would not exclude methadone overdose, fentanyl use, or poly-pharmacy / unintentional overdose of prescribed meds. She is on multiple high risk meds- Lyrica, effexor, seroquel flexeril and clonazepam at home.  Hepatic encephalopathy could be a contributor as well. Sepsis remains on the differential as well. Plan: Hold all sedating  meds; will eventually need to restart low-dose benzos to avoid withdrawal  Added ammonia level-->this is elevated; will start lactulose  EEG STAT- tech paged. PAD protocol w/ RASS goal 0. Sedation off for now. Supportive  care Gentle hydration Cultures sent also need to r/o infection; empiric cefepime & vancomycin CT head reviewed- no acute findings to explain mental status. If metabolic causes are ruled out and EEG is noncontributory, may need MRI, but metabolic causes of encephalopathy seem more likely than structural.  Acute respiratory failure with hypoxia- multifactorial due to issues maintaining airway from acute encephalopathy, likely pulmonary edema from hypervolemia. Further c/b acute hypoxic respiratory failure (P/F ratio 159 post intubation) w/ new diffuse bilateral infiltrates on CXR. Not sure if this is edema OR represents PNA w/ ARDS Plan: LTVV; 4-8cc/kg IBW, goal Pplat <30 and DP<15 Titrate PEP & FiO2 per ARDS protocol Daily SAT & SBT when appropriate Respiratory culture and RVP Will need diuresis when she settles out hemodynamically; hopefully by tomorrow. Echo  HAP coverage (just left hospital)- cefepime & vancomycin Repeat ABG since arrival; doubt ARDS as she is down to 40% FiO2 AM CXR Advance ETT 2 cm.  Acute on chronic  HFpEF. Recently had wt gain of 20 lbs. High risk for chronic RV dysfxn due to obesity un-treated OSA and chronic hypoxic could be a contributing factor as well, this could exacerbate hepatic congestion Plan Repeat echo Holding antihypertensives Tele monitoring   Hypotension- likely due to propofol, but sepsis remains possible -on peripheral pressors; hopefully can wean off with propofol held -empiric broad spectrum antibiotics -no ascites to tap -con't to evaluate for potential sources of infection- sputum, urine, blood cx  H/o cirrhosis/ NASH hyperammonemia Plan Checking ammonia level>> lactulose Trend LFTs INR tomorrow morning  H/o DM w/ hypoglycemia  Plan: Cont hourly glucose Cont D10w>> titrate off before adding insulin please Hold PTA insulin and oral agents Stress dose steroids given high D10W requirements   Mild anemia Plan: Cont to  monitor Transfuse for Hb <7 or hemodynamically significant bleeding  Chronic thrombocytopenia due to cirrhosis -con't to monitor  Attempted to call her mother to provide an update- no answer. Left a message.    Best practice:  Diet: NPO Pain/Anxiety/Delirium protocol (if indicated): pad protocol VAP protocol (if indicated): ordered DVT prophylaxis: SCD GI prophylaxis: PPI Glucose control: hourly checks Mobility: npo Code Status: full code  Family Communication: mother Ivin Booty- L/M Disposition:  ICU  Labs   CBC: Recent Labs  Lab 08/31/20 0921  WBC 9.8  NEUTROABS 8.2*  HGB 10.4*  HCT 32.4*  MCV 91.3  PLT 137*    Basic Metabolic Panel: Recent Labs  Lab 08/31/20 0921  NA 135  K 4.2  CL 105  CO2 24  GLUCOSE 48*  BUN 9  CREATININE 0.43*  CALCIUM 7.8*   GFR: Estimated Creatinine Clearance: 140.7 mL/min (A) (by C-G formula based on SCr of 0.43 mg/dL (L)). Recent Labs  Lab 08/31/20 0916 08/31/20 0921  WBC  --  9.8  LATICACIDVEN 1.1  --     Liver Function Tests: Recent Labs  Lab 08/31/20 0921  AST 30  ALT 30  ALKPHOS 87  BILITOT 1.0  PROT 6.6  ALBUMIN 2.6*   No results for input(s): LIPASE, AMYLASE in the last 168 hours. No results for input(s): AMMONIA in the last 168 hours.  ABG    Component Value Date/Time   PHART 7.321 (L) 08/31/2020 1036   PCO2ART 47.1 08/31/2020 1036   PO2ART 159 (H)  08/31/2020 1036   HCO3 22.7 08/31/2020 1036   TCO2 21.8 08/21/2010 1440   ACIDBASEDEF 1.6 08/31/2020 1036   O2SAT 98.7 08/31/2020 1036     Coagulation Profile: No results for input(s): INR, PROTIME in the last 168 hours.  Cardiac Enzymes: No results for input(s): CKTOTAL, CKMB, CKMBINDEX, TROPONINI in the last 168 hours.  HbA1C: Hemoglobin A1C  Date/Time Value Ref Range Status  12/06/2019 12:00 AM 10.4  Final   Hgb A1c MFr Bld  Date/Time Value Ref Range Status  08/13/2020 05:38 AM 7.1 (H) 4.8 - 5.6 % Final    Comment:    (NOTE) Pre diabetes:           5.7%-6.4%  Diabetes:              >6.4%  Glycemic control for   <7.0% adults with diabetes   07/29/2020 01:01 PM 8.0 (H) 4.8 - 5.6 % Final    Comment:    (NOTE) Pre diabetes:          5.7%-6.4%  Diabetes:              >6.4%  Glycemic control for   <7.0% adults with diabetes     CBG: Recent Labs  Lab 08/31/20 0946 08/31/20 1030 08/31/20 1105 08/31/20 1156 08/31/20 1309  GLUCAP 113* 67* 74 59* 55*    Review of Systems:   Unable to be obtained due to encephalopathy  Past Medical History  She,  has a past medical history of Anxiety, Asthma, Chronic abdominal pain, Chronic back pain, Cirrhosis ([place]), Cirrhosis ([place]), COPD (chronic obstructive pulmonary disease) ([place]), Depression, Diabetes mellitus without complication ([place]), Diabetes mellitus, type II ([place]), Hepatitis C, Insomnia, Long-term current use of methadone for opiate dependence ([place]), Lupus ([place]), Migraine headache, Neuropathy, Nocturnal seizures ([place]), Peptic ulcer, and Spleen enlarged.   Surgical History    Past Surgical History:  Procedure Laterality Date  . CHOLECYSTECTOMY       Social History   reports that she has been smoking e-cigarettes. She has been smoking about 0.00 packs per day for the past 5.00 years. She has never used smokeless tobacco. She reports that she does not drink alcohol and does not use drugs.   Family History   Her family history includes Hyperlipidemia in her maternal grandfather and mother; Hypertension in her mother.   Allergies Allergies  Allergen Reactions  . Iodine-131 Anaphylaxis, Shortness Of Breath and Swelling  . Ivp Dye [Iodinated Diagnostic Agents] Anaphylaxis    Skin gets very red, unable to walk   . Ketorolac Tromethamine Shortness Of Breath  . Tylenol [Acetaminophen] Other (See Comments)    Due to cirrhosis   . Gabapentin   . Morphine And Related     Pt says she is not allergic to Morphine 08/12/20  . Nsaids Other (See Comments)    Flares ulcers  .  Suboxone [Buprenorphine Hcl-Naloxone Hcl]   . Vancomycin Swelling    Facial swelling,      Home Medications  Prior to Admission medications   Medication Sig Start Date End Date Taking? Authorizing Provider  Accu-Chek Softclix Lancets lancets 4 (four) times daily. 07/07/20   [provider]  albuterol (ACCUNEB) 0.63 MG/3ML nebulizer solution Take by nebulization 4 (four) times daily. 07/27/20   [provider]  albuterol (PROVENTIL) (2.5 MG/3ML) 0.083% nebulizer solution Take 2.5 mg by nebulization every 6 (six) hours as needed for wheezing or shortness of breath.    [provider]  aspirin EC 81 MG  tablet Take 162 mg by mouth daily. Swallow whole.    [provider]  cetirizine (ZYRTEC ALLERGY) 10 MG tablet Take 1 tablet (10 mg total) by mouth daily. 07/09/20   Avegno, Darrelyn Hillock, FNP  clonazePAM (KLONOPIN) 0.5 MG tablet Take 1 tablet (0.5 mg total) by mouth 2 (two) times daily. 09/06/20 12/05/20  Norman Clay, MD  Continuous Blood Gluc Sensor (DEXCOM G6 SENSOR) MISC 4 Pieces by Does not apply route once a week. 07/22/20   Brita Romp, NP  Continuous Blood Gluc Transmit (DEXCOM G6 TRANSMITTER) MISC 1 Piece by Does not apply route as directed. 07/22/20   Brita Romp, NP  cyclobenzaprine (FLEXERIL) 10 MG tablet Take 1 tablet (10 mg total) by mouth at bedtime. Patient taking differently: Take 10 mg by mouth 2 (two) times daily at 8 am and 10 pm.  06/13/20   Wurst, [place], PA-C  dicyclomine (BENTYL) 10 MG capsule Take 10 mg by mouth 3 (three) times daily as needed for spasms.  07/25/20   [provider]  docusate sodium (COLACE) 100 MG capsule Take 100 mg by mouth 4 (four) times daily.    [provider]  etonogestrel (NEXPLANON) 68 MG IMPL implant 1 each by Subdermal route once.    [provider]  fluconazole (DIFLUCAN) 200 MG tablet Take 400 mg by mouth daily. 06/18/20   [provider]  fluticasone (FLONASE) 50 MCG/ACT  nasal spray Place 1 spray into both nostrils daily for 14 days. 07/09/20 08/12/20  Avegno, Darrelyn Hillock, FNP  furosemide (LASIX) 40 MG tablet Take 2 tablets by mouth daily.  05/22/20   [provider]  glucose blood (ACCU-CHEK GUIDE) test strip Use as instructed to check blood glucose four times daily 06/07/20   Cassandria Anger, MD  hyoscyamine (ANASPAZ) 0.125 MG TBDP disintergrating tablet Place 0.125 mg under the tongue 3 (three) times daily as needed for bladder spasms or cramping (Patient Preference).  08/05/20   [provider]  insulin isophane & regular human (HUMULIN 70/30 MIX) (70-30) 100 UNIT/ML KwikPen Inject 90 units with breakfast and 80 units with supper 08/06/20   Brita Romp, NP  Insulin Pen Needle (PEN NEEDLES) 32G X 6 MM MISC 1 each by Does not apply route 3 (three) times daily. 07/23/20   Cassandria Anger, MD  liraglutide (VICTOZA) 18 MG/3ML SOPN Inject 1.8 mg into the skin daily. 07/22/20   Brita Romp, NP  lisinopril (ZESTRIL) 5 MG tablet Take 1 tablet (5 mg total) by mouth daily. 06/06/20   Brita Romp, NP  metFORMIN (GLUCOPHAGE) 1000 MG tablet Take 1 tablet (1,000 mg total) by mouth 2 (two) times daily with a meal. 07/22/20   Brita Romp, NP  methadone (DOLOPHINE) 10 MG/5ML solution Take 30 mg by mouth every morning.     [provider]  Multiple Vitamin (MULTIVITAMIN) tablet Take 1 tablet by mouth daily.    [provider]  nystatin (MYCOSTATIN) 100000 UNIT/ML suspension Take 5 mLs (500,000 Units total) by mouth 4 (four) times daily. 06/13/20   Wurst, [place], PA-C  ondansetron (ZOFRAN-ODT) 4 MG disintegrating tablet Take 4 mg by mouth every 8 (eight) hours as needed for nausea.  06/14/20   [provider]  pantoprazole (PROTONIX) 40 MG tablet Take 40 mg by mouth 2 (two) times daily. 07/05/20   [provider]  potassium chloride (KLOR-CON) 10 MEQ tablet Take 2 tablets by mouth daily.  04/24/20    [provider]  pregabalin (LYRICA) 75 MG capsule Take 75 mg by mouth in the morning, at noon, in the evening, and at bedtime.     [provider]  propranolol (INDERAL) 10 MG tablet Take 10 mg by mouth 3 (three) times daily. 08/08/20   [provider]  psyllium (REGULOID) 0.52 g capsule Take 0.52 g by mouth 4 (four) times daily.    [provider]  QUEtiapine (SEROQUEL) 25 MG tablet Take 1 tablet (25 mg total) by mouth at bedtime. 10/16/20   Norman Clay, MD  RESTASIS 0.05 % ophthalmic emulsion Place 1 drop into both eyes as needed (dry eye).  04/22/19   [provider]  rosuvastatin (CRESTOR) 5 MG tablet Take 1 tablet (5 mg total) by mouth daily. 08/06/20   Brita Romp, NP  spironolactone (ALDACTONE) 100 MG tablet Take 200 mg by mouth daily.  05/22/20   [provider]  sucralfate (CARAFATE) 1 g tablet Take 1 g by mouth 4 (four) times daily. 07/02/20   [provider]  venlafaxine XR (EFFEXOR-XR) 150 MG 24 hr capsule Take total of 225 mg daily (150 mg + 75 mg ) 10/17/20   Norman Clay, MD  venlafaxine XR (EFFEXOR-XR) 75 MG 24 hr capsule Take 75 mg by mouth daily. To take along with 133m for a total of 225 mg daily    [provider]     Critical care time: 43 min      LJulian Hy DO 08/31/20 7:07 PM  Pulmonary & Critical Care

## 2020-09-01 ENCOUNTER — Inpatient Hospital Stay (HOSPITAL_COMMUNITY): Payer: Medicaid Other

## 2020-09-01 DIAGNOSIS — G9340 Encephalopathy, unspecified: Secondary | ICD-10-CM | POA: Diagnosis not present

## 2020-09-01 DIAGNOSIS — J690 Pneumonitis due to inhalation of food and vomit: Secondary | ICD-10-CM | POA: Diagnosis not present

## 2020-09-01 DIAGNOSIS — E162 Hypoglycemia, unspecified: Secondary | ICD-10-CM

## 2020-09-01 DIAGNOSIS — J9601 Acute respiratory failure with hypoxia: Secondary | ICD-10-CM | POA: Diagnosis not present

## 2020-09-01 LAB — CBC
HCT: 28.3 % — ABNORMAL LOW (ref 36.0–46.0)
Hemoglobin: 8.9 g/dL — ABNORMAL LOW (ref 12.0–15.0)
MCH: 28.9 pg (ref 26.0–34.0)
MCHC: 31.4 g/dL (ref 30.0–36.0)
MCV: 91.9 fL (ref 80.0–100.0)
Platelets: 85 10*3/uL — ABNORMAL LOW (ref 150–400)
RBC: 3.08 MIL/uL — ABNORMAL LOW (ref 3.87–5.11)
RDW: 18.1 % — ABNORMAL HIGH (ref 11.5–15.5)
WBC: 5.5 10*3/uL (ref 4.0–10.5)
nRBC: 0.4 % — ABNORMAL HIGH (ref 0.0–0.2)

## 2020-09-01 LAB — PROTIME-INR
INR: 1.1 (ref 0.8–1.2)
Prothrombin Time: 14.2 seconds (ref 11.4–15.2)

## 2020-09-01 LAB — COMPREHENSIVE METABOLIC PANEL
ALT: 34 U/L (ref 0–44)
AST: 82 U/L — ABNORMAL HIGH (ref 15–41)
Albumin: 2.3 g/dL — ABNORMAL LOW (ref 3.5–5.0)
Alkaline Phosphatase: 76 U/L (ref 38–126)
Anion gap: 7 (ref 5–15)
BUN: 5 mg/dL — ABNORMAL LOW (ref 6–20)
CO2: 23 mmol/L (ref 22–32)
Calcium: 7.7 mg/dL — ABNORMAL LOW (ref 8.9–10.3)
Chloride: 107 mmol/L (ref 98–111)
Creatinine, Ser: 0.6 mg/dL (ref 0.44–1.00)
GFR, Estimated: >60 mL/min (ref >60)
Glucose, Bld: 149 mg/dL — ABNORMAL HIGH (ref 70–99)
Potassium: 4.4 mmol/L (ref 3.5–5.1)
Sodium: 137 mmol/L (ref 135–145)
Total Bilirubin: 1 mg/dL (ref 0.3–1.2)
Total Protein: 5.9 g/dL — ABNORMAL LOW (ref 6.5–8.1)

## 2020-09-01 LAB — GLUCOSE, CAPILLARY
Glucose-Capillary: 155 mg/dL — ABNORMAL HIGH (ref 70–99)
Glucose-Capillary: 133 mg/dL — ABNORMAL HIGH (ref 70–99)
Glucose-Capillary: 146 mg/dL — ABNORMAL HIGH (ref 70–99)
Glucose-Capillary: 143 mg/dL — ABNORMAL HIGH (ref 70–99)
Glucose-Capillary: 148 mg/dL — ABNORMAL HIGH (ref 70–99)
Glucose-Capillary: 140 mg/dL — ABNORMAL HIGH (ref 70–99)
Glucose-Capillary: 111 mg/dL — ABNORMAL HIGH (ref 70–99)

## 2020-09-01 LAB — AMMONIA: Ammonia: 70 umol/L — ABNORMAL HIGH (ref 9–35)

## 2020-09-01 LAB — LACTIC ACID, PLASMA: Lactic Acid, Venous: 2.5 mmol/L (ref 0.5–1.9)

## 2020-09-01 LAB — CREATININE, SERUM
Creatinine, Ser: 0.61 mg/dL (ref 0.44–1.00)
GFR, Estimated: >60 mL/min (ref >60)

## 2020-09-01 MED ORDER — ENOXAPARIN SODIUM 80 MG/0.8ML ~~LOC~~ SOLN
65.0000 mg | SUBCUTANEOUS | Status: DC
  Administered 2020-09-01 – 2020-09-06 (×6): 65 mg via SUBCUTANEOUS
  Filled 2020-09-01 (×2): qty 0.65
  Filled 2020-09-01 (×2): qty 0.8
  Filled 2020-09-01 (×2): qty 0.65

## 2020-09-01 MED ORDER — MIDAZOLAM HCL 2 MG/2ML IJ SOLN
2.0000 mg | INTRAMUSCULAR | Status: AC | PRN
  Administered 2020-09-02 (×2): 2 mg via INTRAVENOUS
  Filled 2020-09-01 (×2): qty 2

## 2020-09-01 MED ORDER — CHLORHEXIDINE GLUCONATE 0.12% ORAL RINSE (MEDLINE KIT)
15.0000 mL | Freq: Two times a day (BID) | OROMUCOSAL | Status: DC
  Administered 2020-09-01 – 2020-09-02 (×4): 15 mL via OROMUCOSAL

## 2020-09-01 MED ORDER — SODIUM CHLORIDE 0.9 % IV BOLUS
500.0000 mL | Freq: Once | INTRAVENOUS | Status: AC
  Administered 2020-09-01: 500 mL via INTRAVENOUS

## 2020-09-01 MED ORDER — MIDAZOLAM HCL 2 MG/2ML IJ SOLN
INTRAMUSCULAR | Status: AC
  Administered 2020-09-01: 2 mg
  Filled 2020-09-01: qty 2

## 2020-09-01 MED ORDER — PANTOPRAZOLE SODIUM 40 MG PO PACK
40.0000 mg | PACK | ORAL | Status: DC
  Administered 2020-09-01 – 2020-09-02 (×2): 40 mg
  Filled 2020-09-01 (×3): qty 20

## 2020-09-01 MED ORDER — PROSOURCE TF PO LIQD
45.0000 mL | Freq: Two times a day (BID) | ORAL | Status: DC
  Administered 2020-09-01 (×2): 45 mL
  Filled 2020-09-01 (×2): qty 45

## 2020-09-01 MED ORDER — PROPOFOL 1000 MG/100ML IV EMUL
0.0000 ug/kg/min | INTRAVENOUS | Status: DC
  Administered 2020-09-01 (×2): 30 ug/kg/min via INTRAVENOUS
  Administered 2020-09-01: 15 ug/kg/min via INTRAVENOUS
  Administered 2020-09-02 (×2): 30 ug/kg/min via INTRAVENOUS
  Filled 2020-09-01 (×5): qty 100

## 2020-09-01 MED ORDER — ORAL CARE MOUTH RINSE
15.0000 mL | OROMUCOSAL | Status: DC
  Administered 2020-09-01 – 2020-09-02 (×15): 15 mL via OROMUCOSAL

## 2020-09-01 MED ORDER — VITAL HIGH PROTEIN PO LIQD
1000.0000 mL | ORAL | Status: DC
  Administered 2020-09-01: 1000 mL

## 2020-09-01 MED ORDER — INSULIN ASPART 100 UNIT/ML ~~LOC~~ SOLN
0.0000 [IU] | SUBCUTANEOUS | Status: DC
  Administered 2020-09-01: 3 [IU] via SUBCUTANEOUS
  Administered 2020-09-02 (×4): 4 [IU] via SUBCUTANEOUS
  Administered 2020-09-02 – 2020-09-03 (×3): 3 [IU] via SUBCUTANEOUS
  Administered 2020-09-03 – 2020-09-04 (×6): 4 [IU] via SUBCUTANEOUS
  Administered 2020-09-04 (×3): 3 [IU] via SUBCUTANEOUS
  Administered 2020-09-04: 4 [IU] via SUBCUTANEOUS
  Administered 2020-09-05 (×5): 3 [IU] via SUBCUTANEOUS
  Administered 2020-09-05 – 2020-09-06 (×4): 4 [IU] via SUBCUTANEOUS
  Administered 2020-09-06 – 2020-09-07 (×2): 3 [IU] via SUBCUTANEOUS
  Administered 2020-09-07: 11 [IU] via SUBCUTANEOUS
  Administered 2020-09-07 (×2): 4 [IU] via SUBCUTANEOUS
  Administered 2020-09-07 – 2020-09-08 (×5): 3 [IU] via SUBCUTANEOUS
  Administered 2020-09-08: 4 [IU] via SUBCUTANEOUS
  Administered 2020-09-09: 3 [IU] via SUBCUTANEOUS

## 2020-09-01 NOTE — Progress Notes (Signed)
eLink Physician-Brief Progress Note Patient Name: Anna-Marie Coller DOB: 11-Sep-1986 MRN: [id number]   Date of Service  09/01/2020  HPI/Events of Note  Notified of CBG > 120 x 2 On D10 infusion at 125 cc/hr  eICU Interventions  Decreased D10 rate to 50 and monitor before deciding to discontinue altogether Off norepinephrine, lactic acid level pending ABG 7.42/35/79     Intervention Category Major Interventions: Hyperglycemia - active titration of insulin therapy  Shona Needles Suhaan Perleberg 09/01/2020, 2:26 AM

## 2020-09-01 NOTE — Progress Notes (Signed)
Transported on vent to MRI and back.

## 2020-09-01 NOTE — Progress Notes (Signed)
Pt taken to MRI for scan of head. Was unable to perform diagnostic unless pt heavily sedated. Was given total of 200 mg fentanyl and 4 mg versed IV, in addition to restarting prop gtt to bring pt out of distress. Once exam complete was then transferred back to unit where she was bathed and lines rearranged and flushed for patency. She now remains calm and no longer fighting ventilation. K. [place], Therapist, sports

## 2020-09-01 NOTE — Progress Notes (Signed)
Delayed entry. LTM EEG hooked up and running - no initial skin breakdown - push button tested - neuro notified.

## 2020-09-01 NOTE — Progress Notes (Signed)
NAME:  Jaime [name], MRN:  [id number], DOB:  1986/10/23, LOS: 1 ADMISSION DATE:  08/31/2020, CONSULTATION DATE:  10/31 REFERRING MD:  Regenia Skeeter, CHIEF COMPLAINT:  Acute metabolic vs toxic encephalopathy    Brief History   34 year old female found by family unresponsive. Glucose in 30s. No sig response clinically to Narcan or glucose. Intubated for airway protection. Sent to [facility] for further Eval from [facility] ER.   History of present illness   34 year old female w/ hx as outlined below. Presented to the ER after being found by family unresponsive in a chair (still had dinner from prior night in front of her). EMS noted blood sugar 38 on arrival->58 after given glucagon. This did not result in improved mentation. On arrival to ER was initially completely unresponsive but spontaneously breathing. Given glucose and Narcan. This resulted in her then responding to pain and moving spontaneously but would not follow commands. She was intubated for airway protection and [facility]   Past Medical History  Hep C s/p treatment, NAFLD/NASH, Cirrhosis, IV drug abuse (abstained for 24yr), DM, MO, chronic dyspnea, HFpEF ->just discharged 10/16 after being treated for sepsis source never clearly identified, c/b volume overload (had 20lb wt gain)  Significant Hospital Events   Initial dx findings: cbg 51, BNP 131, nml cr and BUN, WBC ct nml, UA unremarkable, ethanol <<78 apap and salicylate neg, rapid UDS: negative,   Consults:    Procedures:  ETT 10/30 >>  Significant Diagnostic Tests:   Echo 9/29: Left ventricular ejection fraction, by estimation, is 55 to 60%. The left ventricle has normal function.Left ventricular  diastolic parameters are Indeterminate. RV, RA size and RV fxn nml.   CT head 10/30: negative   EEG 10/30: generalized slowing  Micro Data:  BCX2 10/30>>> UC 10/30>>> SARS2: neg  Antimicrobials:  vanc 10/30 >> 10/31 Cefepime 10/30 >>   Interim  history/subjective:  Off pressors, off sedation.  Remains on LTM.  On pressure support.  Objective   Blood pressure (!) 127/58, pulse 100, temperature 99 F (37.2 C), resp. rate 18, height 5' 9"  (1.753 m), weight 130 kg, SpO2 99 %.    Vent Mode: PSV;CPAP FiO2 (%):  [35 %-100 %] 35 % Set Rate:  [18 bmp-22 bmp] 18 bmp Vt Set:  [500 mL-530 mL] 500 mL PEEP:  [5 cmH20] 5 cmH20 Pressure Support:  [5 cmH20] 5 cmH20 Plateau Pressure:  [15 cmH20-32 cmH20] 21 cmH20   Intake/Output Summary (Last 24 hours) at 09/01/2020 0750 Last data filed at 09/01/2020 0600 Gross per 24 hour  Intake 5087.25 ml  Output --  Net 5087.25 ml   Filed Weights   08/31/20 0945  Weight: 130 kg    Examination:  General - on vent Eyes - pupils reactive ENT - ETT in place Cardiac - regular rate/rhythm, no murmur Chest - equal breath sounds b/l, no wheezing or rales Abdomen - mild distention, non tender, decreased bowel sounds Extremities - 1+ edema Skin - no rashes Neuro - no response to stimuli  Resolved Hospital Problem list   Sedation induced hypotension  Assessment & Plan:   Acute metabolic encephalopathy from hypoglycemia. Hx of IVDA with chronic methadone use. Polypharmacy. Elevated ammonia level with hx of cirrhosis. - continue LTM - MRI brain w/o contrast - limit sedation while assessing mental status - continue lactulose and f/u ammonia level - hold outpt klonopin, flexeril, methadone, lyrica, seroquel, effexor  Sepsis from aspiration pneumonitis. - MRSA PCR negative >>  will d/c vancomycin - day 2 of cefepime  Acute hypoxic respiratory failure with compromised airway. - pressure support as able - mental status barrier to extubation trial - f/u CXR  Acute on chronic diastolic CHF. HLD. - goal even fluid balance - hold outpt lasix, lisinopril, crestor, aldactone  Cirrhosis with hx of NASH and Hep C. - f/u LFTs - hold outpt inderal  DM type 2 poorly controlled with  hypoglycemia. - add tube feeds - SSI - d/c dextrose in IV fluids - hold outpt victoza, metformin  Anemia of critical illness and chronic disease. 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 - f/u CBC - transfuse for Hb < 7, PLT < 10K, or significant bleeding   Best practice:  Diet: tube feeds DVT prophylaxis: lovenox GI prophylaxis: protonix Mobility: bed rest Code Status: full code  Disposition:  ICU  Labs    CMP Latest Ref Rng & Units 09/01/2020 09/01/2020 08/31/2020  Glucose 70 - 99 mg/dL 149(H) - -  BUN 6 - 20 mg/dL 5(L) - -  Creatinine 0.44 - 1.00 mg/dL 0.60 0.61 -  Sodium 135 - 145 mmol/L 137 - 138  Potassium 3.5 - 5.1 mmol/L 4.4 - 4.2  Chloride 98 - 111 mmol/L 107 - -  CO2 22 - 32 mmol/L 23 - -  Calcium 8.9 - 10.3 mg/dL 7.7(L) - -  Total Protein 6.5 - 8.1 g/dL 5.9(L) - -  Total Bilirubin 0.3 - 1.2 mg/dL 1.0 - -  Alkaline Phos 38 - 126 U/L 76 - -  AST 15 - 41 U/L 82(H) - -  ALT 0 - 44 U/L 34 - -    CBC Latest Ref Rng & Units 09/01/2020 08/31/2020 08/31/2020  WBC 4.0 - 10.5 K/uL 5.5 - 9.8  Hemoglobin 12.0 - 15.0 g/dL 8.9(L) 17.3(H) 10.4(L)  Hematocrit 36 - 46 % 28.3(L) 51.0(H) 32.4(L)  Platelets 150 - 400 K/uL 85(L) - 137(L)    ABG    Component Value Date/Time   PHART 7.432 08/31/2020 1934   PCO2ART 35.1 08/31/2020 1934   PO2ART 79 (L) 08/31/2020 1934   HCO3 23.4 08/31/2020 1934   TCO2 24 08/31/2020 1934   ACIDBASEDEF 1.6 08/31/2020 1036   O2SAT 96.0 08/31/2020 1934    Recent Labs  Lab 08/31/20 1452  AMMONIA 67*    CBG: Recent Labs  Lab 08/31/20 2325 09/01/20 0201 09/01/20 0344 09/01/20 0617 09/01/20 0732  GLUCAP 130* 155* 133* 146* 143*      Critical care time: 34 minutes  Chesley Mires, MD [place] Pager - [phone] - [phone] 09/01/2020, 8:02 AM

## 2020-09-01 NOTE — Progress Notes (Addendum)
Unhook for MRI. LTM EEG discontinued - no skin breakdown at [facility].

## 2020-09-01 NOTE — Progress Notes (Signed)
Initiated several calls to locate primary personal to remove EEG paraphernalia from pt's head in order to transfer her down for MRI ordered by Dr. Halford Chessman this am. Phone contact included several numbers given by [facility] charge nurse, Dr Leonel Ramsay, and NP from neurology specific to locating EEG tech. Time spent in total has been over 7 hours for response. DR. Sood aware of diagnostic delay. He states he is ok if MRI happens later or at latest by early am tomorrow. K. [place], Therapist, sports

## 2020-09-01 NOTE — Progress Notes (Signed)
CRITICAL VALUE ALERT Critical Value:  Lactic Acid  Date & Time Notied:  09/02/11 0423  Provider Notified: E link  Orders Received/Actions taken:

## 2020-09-01 NOTE — Progress Notes (Signed)
[place] Progress Note Patient Name: Jaime [name] DOB: 02-Sep-1986 MRN: [id number]   Date of Service  09/01/2020  HPI/Events of Note  Lactic acid 2.5 from 1.1 UO 1650 HR trending up Continues to be off pressors  eICU Interventions  Ordered a trial of 500 cc NS bolus and monitor HR response     Intervention Category Intermediate Interventions: Other:  Judd Lien 09/01/2020, 4:32 AM

## 2020-09-01 NOTE — Procedures (Addendum)
Patient Name: [name]  MRN: [id number]  Epilepsy Attending: Lora Havens  Referring Physician/Provider: Salvadore Dom, NP Duration: 08/31/2020 2112 to 09/01/2020 1540  Patient history: 34yo F with ams. EEG to evaluate for seizure.  Level of alertness: comatose  AEDs during EEG study: Propofol  Technical aspects: This EEG study was done with scalp electrodes positioned according to the 10-20 International system of electrode placement. Electrical activity was acquired at a sampling rate of 500Hz  and reviewed with a high frequency filter of 70Hz  and a low fre quency filter of 1Hz . EEG data were recorded continuously and digitally stored.   Description: EEG showed continuous generalized 3 to 6 Hz theta-delta slowing which at times appears sharply contoured admixed with generalized 13-15Hz  beta activity.  Hyperventilation and photic stimulation were not performed.     ABNORMALITY -Continuous slow, generalized  IMPRESSION: This study is suggestive of severe diffuse encephalopathy, nonspecific etiology but likely related to sedation. No seizures ordefinite  epileptiform discharges were seen throughout the recording.  Mat Stuard [name]

## 2020-09-01 NOTE — Progress Notes (Signed)
Called the floor to check on the status of the patient @12 :54pm Per RN patient has EEG leads on and RN can not get ahold of anyone to d/c EEG order and come to remove leads.  Dr. Halford Chessman aware of situation and is okay if MRI happens tomorrow.

## 2020-09-01 NOTE — Progress Notes (Signed)
Pt with BMI 42. Ok to change SQ heparin to Lovenox 0.58m/kg/day per Dr. SHalford Chessman   hgb 8.9 plt 85 (likely d/t cirrhosis)  MOnnie Boer PharmD, BCIDP, AAHIVP, CPP Infectious Disease Pharmacist 09/01/2020 7:42 AM

## 2020-09-02 ENCOUNTER — Inpatient Hospital Stay (HOSPITAL_COMMUNITY): Payer: Medicaid Other

## 2020-09-02 DIAGNOSIS — R579 Shock, unspecified: Secondary | ICD-10-CM | POA: Diagnosis not present

## 2020-09-02 DIAGNOSIS — J9601 Acute respiratory failure with hypoxia: Secondary | ICD-10-CM | POA: Diagnosis not present

## 2020-09-02 DIAGNOSIS — I5033 Acute on chronic diastolic (congestive) heart failure: Secondary | ICD-10-CM

## 2020-09-02 DIAGNOSIS — G9341 Metabolic encephalopathy: Secondary | ICD-10-CM | POA: Diagnosis not present

## 2020-09-02 DIAGNOSIS — J810 Acute pulmonary edema: Secondary | ICD-10-CM

## 2020-09-02 DIAGNOSIS — G9340 Encephalopathy, unspecified: Secondary | ICD-10-CM | POA: Diagnosis not present

## 2020-09-02 LAB — COMPREHENSIVE METABOLIC PANEL
ALT: 34 U/L (ref 0–44)
AST: 83 U/L — ABNORMAL HIGH (ref 15–41)
Albumin: 2.2 g/dL — ABNORMAL LOW (ref 3.5–5.0)
Alkaline Phosphatase: 73 U/L (ref 38–126)
Anion gap: 7 (ref 5–15)
BUN: 10 mg/dL (ref 6–20)
CO2: 23 mmol/L (ref 22–32)
Calcium: 7.6 mg/dL — ABNORMAL LOW (ref 8.9–10.3)
Chloride: 108 mmol/L (ref 98–111)
Creatinine, Ser: 0.57 mg/dL (ref 0.44–1.00)
GFR, Estimated: >60 mL/min (ref >60)
Glucose, Bld: 127 mg/dL — ABNORMAL HIGH (ref 70–99)
Potassium: 4.3 mmol/L (ref 3.5–5.1)
Sodium: 138 mmol/L (ref 135–145)
Total Bilirubin: 0.7 mg/dL (ref 0.3–1.2)
Total Protein: 5.8 g/dL — ABNORMAL LOW (ref 6.5–8.1)

## 2020-09-02 LAB — CBC
HCT: 25.5 % — ABNORMAL LOW (ref 36.0–46.0)
Hemoglobin: 8.3 g/dL — ABNORMAL LOW (ref 12.0–15.0)
MCH: 30.2 pg (ref 26.0–34.0)
MCHC: 32.5 g/dL (ref 30.0–36.0)
MCV: 92.7 fL (ref 80.0–100.0)
Platelets: 40 10*3/uL — ABNORMAL LOW (ref 150–400)
RBC: 2.75 MIL/uL — ABNORMAL LOW (ref 3.87–5.11)
RDW: 18.3 % — ABNORMAL HIGH (ref 11.5–15.5)
WBC: 3.6 10*3/uL — ABNORMAL LOW (ref 4.0–10.5)
nRBC: 0 % (ref 0.0–0.2)

## 2020-09-02 LAB — POCT I-STAT 7, (LYTES, BLD GAS, ICA,H+H)
Acid-Base Excess: 4 mmol/L — ABNORMAL HIGH (ref 0.0–2.0)
Bicarbonate: 27.9 mmol/L (ref 20.0–28.0)
Calcium, Ion: 1.12 mmol/L — ABNORMAL LOW (ref 1.15–1.40)
HCT: 24 % — ABNORMAL LOW (ref 36.0–46.0)
Hemoglobin: 8.2 g/dL — ABNORMAL LOW (ref 12.0–15.0)
O2 Saturation: 98 %
Patient temperature: 99.4
Potassium: 4 mmol/L (ref 3.5–5.1)
Sodium: 141 mmol/L (ref 135–145)
TCO2: 29 mmol/L (ref 22–32)
pCO2 arterial: 39.8 mmHg (ref 32.0–48.0)
pH, Arterial: 7.455 — ABNORMAL HIGH (ref 7.350–7.450)
pO2, Arterial: 96 mmHg (ref 83.0–108.0)

## 2020-09-02 LAB — PROTIME-INR
INR: 1.2 (ref 0.8–1.2)
Prothrombin Time: 14.3 seconds (ref 11.4–15.2)

## 2020-09-02 LAB — GLUCOSE, CAPILLARY
Glucose-Capillary: 70 mg/dL (ref 70–99)
Glucose-Capillary: 81 mg/dL (ref 70–99)
Glucose-Capillary: 125 mg/dL — ABNORMAL HIGH (ref 70–99)
Glucose-Capillary: 129 mg/dL — ABNORMAL HIGH (ref 70–99)
Glucose-Capillary: 153 mg/dL — ABNORMAL HIGH (ref 70–99)
Glucose-Capillary: 180 mg/dL — ABNORMAL HIGH (ref 70–99)
Glucose-Capillary: 170 mg/dL — ABNORMAL HIGH (ref 70–99)
Glucose-Capillary: 164 mg/dL — ABNORMAL HIGH (ref 70–99)

## 2020-09-02 LAB — MAGNESIUM: Magnesium: 2.1 mg/dL (ref 1.7–2.4)

## 2020-09-02 LAB — PHOSPHORUS: Phosphorus: 2.8 mg/dL (ref 2.5–4.6)

## 2020-09-02 LAB — HCV RNA QUANT RFLX ULTRA OR GENOTYP
HCV RNA Qnt(log copy/mL): UNDETERMINED log10 IU/mL
HepC Qn: NOT DETECTED IU/mL

## 2020-09-02 MED ORDER — LORAZEPAM 2 MG/ML IJ SOLN
INTRAMUSCULAR | Status: AC
  Filled 2020-09-02: qty 1

## 2020-09-02 MED ORDER — LORAZEPAM 0.5 MG PO TABS: 1.0000 mg | ORAL_TABLET | Freq: Four times a day (QID) | ORAL | Status: DC | PRN

## 2020-09-02 MED ORDER — OXYCODONE HCL 5 MG PO TABS
5.0000 mg | ORAL_TABLET | ORAL | Status: DC | PRN
  Administered 2020-09-03 (×2): 5 mg via ORAL
  Filled 2020-09-02 (×2): qty 1

## 2020-09-02 MED ORDER — LORAZEPAM 2 MG/ML IJ SOLN
0.5000 mg | Freq: Four times a day (QID) | INTRAMUSCULAR | Status: DC | PRN
  Administered 2020-09-02 – 2020-09-03 (×2): 0.5 mg via INTRAVENOUS
  Filled 2020-09-02: qty 1

## 2020-09-02 MED ORDER — FUROSEMIDE 10 MG/ML IJ SOLN
40.0000 mg | Freq: Two times a day (BID) | INTRAMUSCULAR | Status: DC
  Administered 2020-09-02 – 2020-09-08 (×13): 40 mg via INTRAVENOUS
  Filled 2020-09-02 (×13): qty 4

## 2020-09-02 MED ORDER — DEXMEDETOMIDINE HCL IN NACL 400 MCG/100ML IV SOLN
0.4000 ug/kg/h | INTRAVENOUS | Status: DC
  Administered 2020-09-02: 1 ug/kg/h via INTRAVENOUS
  Administered 2020-09-02: 1.2 ug/kg/h via INTRAVENOUS
  Administered 2020-09-02: 0.4 ug/kg/h via INTRAVENOUS
  Administered 2020-09-02 – 2020-09-03 (×5): 1.2 ug/kg/h via INTRAVENOUS
  Filled 2020-09-02: qty 100
  Filled 2020-09-02: qty 200
  Filled 2020-09-02: qty 100
  Filled 2020-09-02: qty 200
  Filled 2020-09-02 (×2): qty 100
  Filled 2020-09-02: qty 200

## 2020-09-02 MED ORDER — IPRATROPIUM-ALBUTEROL 0.5-2.5 (3) MG/3ML IN SOLN
RESPIRATORY_TRACT | Status: AC
  Administered 2020-09-02: 3 mL
  Filled 2020-09-02: qty 3

## 2020-09-02 MED ORDER — CHLORHEXIDINE GLUCONATE 0.12 % MT SOLN
15.0000 mL | Freq: Two times a day (BID) | OROMUCOSAL | Status: DC
  Administered 2020-09-02 – 2020-09-09 (×13): 15 mL via OROMUCOSAL
  Filled 2020-09-02 (×12): qty 15

## 2020-09-02 MED ORDER — VITAL AF 1.2 CAL PO LIQD
1000.0000 mL | ORAL | Status: DC
  Filled 2020-09-02: qty 1000

## 2020-09-02 MED ORDER — PROSOURCE TF PO LIQD
90.0000 mL | Freq: Two times a day (BID) | ORAL | Status: DC
  Administered 2020-09-02: 90 mL

## 2020-09-02 MED ORDER — IPRATROPIUM-ALBUTEROL 0.5-2.5 (3) MG/3ML IN SOLN: 3.0000 mL | Freq: Once | RESPIRATORY_TRACT | Status: AC

## 2020-09-02 MED ORDER — ORAL CARE MOUTH RINSE
15.0000 mL | Freq: Two times a day (BID) | OROMUCOSAL | Status: DC
  Administered 2020-09-03 – 2020-09-09 (×10): 15 mL via OROMUCOSAL

## 2020-09-02 NOTE — Progress Notes (Addendum)
NAME:  Jaime [name], MRN:  [id number], DOB:  Mar 23, 1986, LOS: 2 ADMISSION DATE:  08/31/2020, CONSULTATION DATE:  10/31 REFERRING MD:  Regenia Skeeter, CHIEF COMPLAINT:  Acute metabolic vs toxic encephalopathy    Brief History   34 year old female found by family unresponsive. Glucose in 30s. No sig response clinically to Narcan or glucose. Intubated for airway protection. Sent to [facility] for further Eval from [facility] ER.   History of present illness   34 year old female w/ hx as outlined below. Presented to the ER after being found by family unresponsive in a chair (still had dinner from prior night in front of her). EMS noted blood sugar 38 on arrival->58 after given glucagon. This did not result in improved mentation. On arrival to ER was initially completely unresponsive but spontaneously breathing. Given glucose and Narcan. This resulted in her then responding to pain and moving spontaneously but would not follow commands. She was intubated for airway protection and [facility]   Past Medical History  Hep C s/p treatment, NAFLD/NASH, Cirrhosis, IV drug abuse (abstained for 41yr), DM, MO, chronic dyspnea, HFpEF ->just discharged 10/16 after being treated for sepsis source never clearly identified, c/b volume overload (had 20lb wt gain)  Significant Hospital Events   Initial dx findings: cbg 51, BNP 131, nml cr and BUN, WBC ct nml, UA unremarkable, ethanol <<29 apap and salicylate neg, rapid UDS: negative,   Consults:    Procedures:  ETT 10/30 >>  Significant Diagnostic Tests:   Echo 9/29: Left ventricular ejection fraction, by estimation, is 55 to 60%. The left ventricle has normal function.Left ventricular  diastolic parameters are Indeterminate. RV, RA size and RV fxn nml.   CT head 10/30: negative   EEG 10/30: generalized slowing  MRI performed 10/31 shows no acute intracranial process  Micro Data:  BCX2 10/30>>>no growth to date UC 10/30>>> no growth to  date SARS2: negative  Antimicrobials:  vanc 10/30 >> 10/31 Cefepime 10/30 >>   Interim history/subjective:  Off pressors.  On some fentanyl.  Tolerating pressure support, was apparently on this all night.  Minimal vent settings.  Objective   Blood pressure (!) 142/51, pulse 98, temperature 99 F (37.2 C), resp. rate 18, height 5' 9"  (1.753 m), weight (!) 150.5 kg, SpO2 100 %.    Vent Mode: PSV;CPAP FiO2 (%):  [3 %-40 %] 40 % PEEP:  [5 cmH20] 5 cmH20 Pressure Support:  [5 cmH20] 5 cmH20   Intake/Output Summary (Last 24 hours) at 09/02/2020 0814 Last data filed at 09/02/2020 0700 Gross per 24 hour  Intake 1456.91 ml  Output 1140 ml  Net 316.91 ml   Filed Weights   08/31/20 0945 09/02/20 0400  Weight: 130 kg (!) 150.5 kg    Examination:  General - on vent Eyes - pupils reactive ENT - ETT in place Cardiac - regular rate/rhythm, no murmur Chest - equal breath sounds b/l, no wheezing or rales Abdomen - mild distention, non tender, decreased bowel sounds Extremities - 2+ edema Skin - no rashes Neuro - no response to stimuli  Resolved Hospital Problem list   Sedation induced hypotension  Assessment & Plan:   Acute metabolic encephalopathy from hypoglycemia. Hx of IVDA with chronic methadone use. Polypharmacy. Elevated ammonia level with hx of cirrhosis. -We will stop all continuous sedation, transition to Precedex if require something else. - MRI brain w/o contrast unremarkable, suspect this is all metabolic. - limit sedation while assessing mental status -Ammonia mildly  elevated - hold outpt klonopin, flexeril, methadone, lyrica, seroquel, effexor  Sepsis from aspiration pneumonitis. -Sepsis ruled out, no leukocytosis suspect this is all hypotension from circulatory shock sedation and hypovolemia -We will discontinue all antibiotics  Acute hypoxic respiratory failure with compromised airway. - pressure support as able - mental status barrier to extubation  trial - f/u CXR -Was on pressure support trial all day.  We will try to extubate today pending mental status.  Acute on chronic heart failure preserved ejection fraction HLD. -She appears volume overloaded on exam.  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 - f/u LFTs - hold outpt inderal  DM type 2 poorly controlled with hypoglycemia. -Continue tube feeds - SSI - hold outpt victoza, metformin  Anemia of critical illness and chronic disease. 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 - f/u CBC - transfuse for Hb < 7, PLT < 10K, or significant bleeding   Best practice:  Diet: tube feeds DVT prophylaxis: lovenox GI prophylaxis: protonix Mobility: bed rest Code Status: full code  Disposition:  ICU  The patient is critically ill with multiple organ systems failure and requires high complexity decision making for assessment and support, frequent evaluation and titration of therapies, application of advanced monitoring technologies and extensive interpretation of multiple databases.   Critical Care Time devoted to patient care services described in this note is 32 minutes. This time reflects time of care of this [place] . This critical care time does not reflect separately billable procedures or procedure time, teaching time or supervisory time of PA/NP/Med student/Med Resident etc but could involve care discussion time.  [facility] 09/02/2020 10:08 AM  Pager: [phone] After hours pager: [phone]   Labs    CMP Latest Ref Rng & Units 09/02/2020 09/01/2020 09/01/2020  Glucose 70 - 99 mg/dL 127(H) 149(H) -  BUN 6 - 20 mg/dL 10 5(L) -  Creatinine 0.44 - 1.00 mg/dL 0.57 0.60 0.61  Sodium 135 - 145 mmol/L 138 137 -  Potassium 3.5 - 5.1 mmol/L 4.3 4.4 -  Chloride 98 - 111 mmol/L 108 107 -  CO2 22 - 32 mmol/L 23 23 -  Calcium 8.9 -  10.3 mg/dL 7.6(L) 7.7(L) -  Total Protein 6.5 - 8.1 g/dL 5.8(L) 5.9(L) -  Total Bilirubin 0.3 - 1.2 mg/dL 0.7 1.0 -  Alkaline Phos 38 - 126 U/L 73 76 -  AST 15 - 41 U/L 83(H) 82(H) -  ALT 0 - 44 U/L 34 34 -    CBC Latest Ref Rng & Units 09/02/2020 09/01/2020 08/31/2020  WBC 4.0 - 10.5 K/uL 3.6(L) 5.5 -  Hemoglobin 12.0 - 15.0 g/dL 8.3(L) 8.9(L) 17.3(H)  Hematocrit 36 - 46 % 25.5(L) 28.3(L) 51.0(H)  Platelets 150 - 400 K/uL 40(L) 85(L) -    ABG    Component Value Date/Time   PHART 7.432 08/31/2020 1934   PCO2ART 35.1 08/31/2020 1934   PO2ART 79 (L) 08/31/2020 1934   HCO3 23.4 08/31/2020 1934   TCO2 24 08/31/2020 1934   ACIDBASEDEF 1.6 08/31/2020 1036   O2SAT 96.0 08/31/2020 1934    Recent Labs  Lab 08/31/20 1452 09/01/20 1151  AMMONIA 67* 70*    CBG: Recent Labs  Lab 09/01/20 1113 09/01/20 1545 09/01/20 1928 09/02/20 0336 09/02/20 0729  GLUCAP 148* 140* 111* 125* 129*

## 2020-09-02 NOTE — Procedures (Signed)
Extubation Procedure Note  Patient Details:   Name: Jaime [name] DOB: [birth year] MRN: [id number]   Airway Documentation:    Vent end date: 09/02/20 Vent end time: 1255   Evaluation  O2 sats: transiently fell during during procedure Complications: No apparent complications Patient did tolerate procedure well. Bilateral Breath Sounds: Rhonchi, Expiratory wheezes   No   Positive cuff leak noted. Patient placed on [place] 6L with humidity, Expiratory wheezes noted. MD notified, Duoneb given.  Mingo Amber Oreste Majeed 09/02/2020, 1:17 PM

## 2020-09-02 NOTE — Progress Notes (Signed)
Initial Nutrition Assessment  RD working remotely.  DOCUMENTATION CODES:   Morbid obesity  INTERVENTION:   Tube feeding via OG tube: - Change to Vital AF 1.2 @ 55 ml/hr (1320 ml/day) - ProSource TF 90 ml BID  Tube feeding regimen provides 1744 kcal, 143 grams of protein, and 1071 ml of H2O (meets 100% of needs).   NUTRITION DIAGNOSIS:   Inadequate oral intake related to acute illness as evidenced by NPO status.  GOAL:   Provide needs based on ASPEN/SCCM guidelines  MONITOR:   Vent status, Labs, Weight trends, TF tolerance, Skin, I & O's  REASON FOR ASSESSMENT:   Ventilator, Consult Enteral/tube feeding initiation and management  ASSESSMENT:   34 year old female who presented to the ED on 10/30 after being found unresponsive by family with hypoglycemia. PMH of hepatitis C s/p treatment, NASH, cirrhosis, IVDA (abstained for 10 years), T2DM, CHF, COPD. Pt intubated for airway protection.   Received consult for tube feeding initiation and management.  Unable to obtain diet and weight history at this time. Weight on admission recorded as 130 kg. This is consistent with weights from encounters on 08/16/20, 07/31/20, and 06/13/20. Weight this morning recorded as 150.5 kg. Pt is currently net positive 5.2 L since admission. Per RN edema documentation, pt with +3 pitting generalized edema, +2 pitting edema to BUE, and +4 pitting edema to BLE. Will utilize 130 kg as EDW and monitor trends.   OG tube with tip in stomach per x-ray.  Current TF: Vital High Protein @ 40 ml/hr, ProSource TF 45 ml BID  Patient is currently intubated on ventilator support MV: 11.7 L/min Temp (24hrs), Avg:98.7 F (37.1 C), Min:97.6 F (36.4 C), Max:99.3 F (37.4 C) BP (cuff): 143/68 MAP (cuff): 90  Drips: Precedex NS: 10 ml/hr  Medications reviewed and include: IV lasix, SSI q 4 hours, protonix  Labs reviewed: hemoglobin 8.3 CBG's: 111-148 x 24 hours  UOP: 1440 ml x 24 hours I/O's: +5.2 L  since admit  NUTRITION - FOCUSED PHYSICAL EXAM:  Unable to complete at this time. RD working remotely.  Diet Order:   Diet Order    None      EDUCATION NEEDS:   No education needs have been identified at this time  Skin:  Skin Assessment: Skin Integrity Issues: DTI: coccyx Stage I: sacrum  Last BM:  no documented BM  Height:   Ht Readings from Last 1 Encounters:  08/31/20 5' 9"  (1.753 m)    Weight:   Wt Readings from Last 1 Encounters:  09/02/20 (!) 150.5 kg    Ideal Body Weight:  65.9 kg  BMI:  Body mass index is 49 kg/m.  Estimated Nutritional Needs:   Kcal:  1430-1820  Protein:  140-165 grams  Fluid:  1.6-1.8 L    Gaynell Face, MS, RD, LDN Inpatient Clinical Dietitian Please see AMiON for contact information.

## 2020-09-03 DIAGNOSIS — J9601 Acute respiratory failure with hypoxia: Secondary | ICD-10-CM | POA: Diagnosis not present

## 2020-09-03 DIAGNOSIS — G9340 Encephalopathy, unspecified: Secondary | ICD-10-CM | POA: Diagnosis not present

## 2020-09-03 DIAGNOSIS — B182 Chronic viral hepatitis C: Secondary | ICD-10-CM | POA: Diagnosis not present

## 2020-09-03 DIAGNOSIS — K7460 Unspecified cirrhosis of liver: Secondary | ICD-10-CM | POA: Diagnosis not present

## 2020-09-03 LAB — BASIC METABOLIC PANEL
Anion gap: 10 (ref 5–15)
BUN: 14 mg/dL (ref 6–20)
CO2: 25 mmol/L (ref 22–32)
Calcium: 8.1 mg/dL — ABNORMAL LOW (ref 8.9–10.3)
Chloride: 104 mmol/L (ref 98–111)
Creatinine, Ser: 0.68 mg/dL (ref 0.44–1.00)
GFR, Estimated: >60 mL/min (ref >60)
Glucose, Bld: 187 mg/dL — ABNORMAL HIGH (ref 70–99)
Potassium: 4.3 mmol/L (ref 3.5–5.1)
Sodium: 139 mmol/L (ref 135–145)

## 2020-09-03 LAB — GLUCOSE, CAPILLARY
Glucose-Capillary: 164 mg/dL — ABNORMAL HIGH (ref 70–99)
Glucose-Capillary: 173 mg/dL — ABNORMAL HIGH (ref 70–99)
Glucose-Capillary: 168 mg/dL — ABNORMAL HIGH (ref 70–99)
Glucose-Capillary: 166 mg/dL — ABNORMAL HIGH (ref 70–99)
Glucose-Capillary: 196 mg/dL — ABNORMAL HIGH (ref 70–99)
Glucose-Capillary: 149 mg/dL — ABNORMAL HIGH (ref 70–99)

## 2020-09-03 LAB — CBC
HCT: 26.8 % — ABNORMAL LOW (ref 36.0–46.0)
Hemoglobin: 8.6 g/dL — ABNORMAL LOW (ref 12.0–15.0)
MCH: 29.2 pg (ref 26.0–34.0)
MCHC: 32.1 g/dL (ref 30.0–36.0)
MCV: 90.8 fL (ref 80.0–100.0)
Platelets: 69 10*3/uL — ABNORMAL LOW (ref 150–400)
RBC: 2.95 MIL/uL — ABNORMAL LOW (ref 3.87–5.11)
RDW: 16.5 % — ABNORMAL HIGH (ref 11.5–15.5)
WBC: 2.9 10*3/uL — ABNORMAL LOW (ref 4.0–10.5)
nRBC: 0 % (ref 0.0–0.2)

## 2020-09-03 LAB — CULTURE, RESPIRATORY W GRAM STAIN

## 2020-09-03 LAB — URINE CULTURE: Culture: >=100000 — AB

## 2020-09-03 LAB — AMMONIA: Ammonia: 48 umol/L — ABNORMAL HIGH (ref 9–35)

## 2020-09-03 MED ORDER — PIPERACILLIN-TAZOBACTAM 3.375 G IVPB
3.3750 g | Freq: Three times a day (TID) | INTRAVENOUS | Status: DC
  Administered 2020-09-03: 3.375 g via INTRAVENOUS
  Filled 2020-09-03 (×3): qty 50

## 2020-09-03 MED ORDER — ACETAMINOPHEN 325 MG PO TABS: 650.0000 mg | ORAL_TABLET | Freq: Once | ORAL | Status: DC

## 2020-09-03 MED ORDER — SODIUM CHLORIDE 0.9 % IV SOLN
2.0000 g | INTRAVENOUS | Status: AC
  Administered 2020-09-03 – 2020-09-06 (×19): 2 g via INTRAVENOUS
  Filled 2020-09-03: qty 2
  Filled 2020-09-03 (×4): qty 2000
  Filled 2020-09-03: qty 2
  Filled 2020-09-03: qty 2000
  Filled 2020-09-03 (×3): qty 2
  Filled 2020-09-03 (×2): qty 2000
  Filled 2020-09-03: qty 2
  Filled 2020-09-03: qty 2000
  Filled 2020-09-03 (×2): qty 2
  Filled 2020-09-03: qty 2000
  Filled 2020-09-03: qty 2
  Filled 2020-09-03 (×3): qty 2000

## 2020-09-03 MED ORDER — PANTOPRAZOLE SODIUM 40 MG PO PACK
40.0000 mg | PACK | ORAL | Status: DC
  Administered 2020-09-04 – 2020-09-09 (×5): 40 mg via ORAL
  Filled 2020-09-03 (×5): qty 20

## 2020-09-03 MED ORDER — VENLAFAXINE HCL ER 75 MG PO CP24
225.0000 mg | ORAL_CAPSULE | Freq: Every day | ORAL | Status: DC
  Administered 2020-09-03 – 2020-09-09 (×7): 225 mg via ORAL
  Filled 2020-09-03: qty 3
  Filled 2020-09-03 (×2): qty 1
  Filled 2020-09-03 (×4): qty 3

## 2020-09-03 MED ORDER — QUETIAPINE FUMARATE 25 MG PO TABS
25.0000 mg | ORAL_TABLET | Freq: Every day | ORAL | Status: DC
  Administered 2020-09-03 – 2020-09-08 (×6): 25 mg via ORAL
  Filled 2020-09-03 (×6): qty 1

## 2020-09-03 MED ORDER — SODIUM CHLORIDE 0.9 % IV SOLN
2.0000 g | Freq: Three times a day (TID) | INTRAVENOUS | Status: AC
  Administered 2020-09-03 – 2020-09-08 (×15): 2 g via INTRAVENOUS
  Filled 2020-09-03 (×16): qty 2

## 2020-09-03 MED ORDER — ENSURE ENLIVE PO LIQD
237.0000 mL | Freq: Two times a day (BID) | ORAL | Status: DC
  Administered 2020-09-03 – 2020-09-09 (×10): 237 mL via ORAL
  Filled 2020-09-03: qty 237

## 2020-09-03 NOTE — Progress Notes (Addendum)
NAME:  Estalene Bergey, MRN:  [id number], DOB:  1986/07/05, LOS: 3 ADMISSION DATE:  08/31/2020, CONSULTATION DATE:  10/31 REFERRING MD:  Regenia Skeeter, CHIEF COMPLAINT:  Acute metabolic vs toxic encephalopathy    Brief History   34 year old female found by family unresponsive. Glucose in 30s. No sig response clinically to Narcan or glucose. Intubated for airway protection. Sent to [facility] for further Eval from [facility] ER.   History of present illness   34 year old female w/ hx as outlined below. Presented to the ER after being found by family unresponsive in a chair (still had dinner from prior night in front of her). EMS noted blood sugar 38 on arrival->58 after given glucagon. This did not result in improved mentation. On arrival to ER was initially completely unresponsive but spontaneously breathing. Given glucose and Narcan. This resulted in her then responding to pain and moving spontaneously but would not follow commands. She was intubated for airway protection and [facility]   Past Medical History  Hep C s/p treatment, NAFLD/NASH, Cirrhosis, IV drug abuse (abstained for 8yr), DM, MO, chronic dyspnea, HFpEF ->just discharged 10/16 after being treated for sepsis source never clearly identified, c/b volume overload (had 20lb wt gain)  Significant Hospital Events   Initial dx findings: cbg 51, BNP 131, nml cr and BUN, WBC ct nml, UA unremarkable, ethanol <<02 apap and salicylate neg, rapid UDS: negative,   Consults:    Procedures:  ETT 10/30 >>  Significant Diagnostic Tests:   Echo 9/29: Left ventricular ejection fraction, by estimation, is 55 to 60%. The left ventricle has normal function.Left ventricular  diastolic parameters are Indeterminate. RV, RA size and RV fxn nml.   CT head 10/30: negative   EEG 10/30: generalized slowing  MRI performed 10/31 shows no acute intracranial process  Micro Data:  BCX2 10/30>>>no growth to date UC 10/30>>> Enterococcus  Faecalis SARS2: negative Tracheal Aspirate 10/31>> MODERATE SERRATIA MARCESCENS  Antimicrobials:  vanc 10/30 >> 10/31 Cefepime 10/30 >>   Interim history/subjective:  Off pressors.  On 0.6 of precedex. Sats are 97% on 1L  Moaning , does not answer questions, nods and she does follow a few commands. Net negative 2200 cc's last 24 Still net + 3L since admission WBC 3.6 T max 101.1 + Urine and sputum  Cx   Objective   Blood pressure 131/76, pulse 82, temperature (!) 100.9 F (38.3 C), temperature source Bladder, resp. rate (!) 26, height 5' 9"  (1.753 m), weight (!) 146.5 kg, SpO2 97 %.    Vent Mode: PSV;CPAP FiO2 (%):  [40 %] 40 % PEEP:  [5 cmH20] 5 cmH20 Pressure Support:  [5 cmH20] 5 cmH20   Intake/Output Summary (Last 24 hours) at 09/03/2020 0839 Last data filed at 09/03/2020 0800 Gross per 24 hour  Intake 1127.38 ml  Output 3475 ml  Net -2347.62 ml   Filed Weights   08/31/20 0945 09/02/20 0400 09/03/20 0500  Weight: 130 kg (!) 150.5 kg (!) 146.5 kg    Examination:  General - 34year old female, eyes open, moaning, in NAD Eyes - pupils reactive, equal ENT - ETT has been removed. No JVD, No LAD Cardiac - S1, S2, RRR, No RMG Chest - Bilateral breath sounds, Coarse , diminished per bases Abdomen - Obese, NT, ND, BS + Extremities - 2+ edema upper extremities, 1 + Lower extremities, no obvious deformities Skin - no rashes, no lesions, + tattoos Neuro - Awake, MAE x 4, tracks ,  follows some simple commands, is moaning, non-verbal  Resolved Hospital Problem list   Sedation induced hypotension  Assessment & Plan:   Acute metabolic encephalopathy from hypoglycemia. Hx of IVDA with chronic methadone use. Polypharmacy. Elevated ammonia level with hx of cirrhosis. Mental status remain altered MRI brain w/o contrast unremarkable, suspect this is all metabolic. Plan - No  continuous sedation, wean precedex to off - limit sedation while assessing mental status -  Ammonia mildly elevated>> trend - hold outpt klonopin  flexeril, methadone, lyrica,  - Will restart home seroquel, effexor - Trend Qtc - trend ammonia level - Trend LFT's prn  Sepsis from aspiration pneumonitis. Urine Cx + for enterococcus faecalis Sputum Cx + for Seratia Marcescens -Sepsis ruled out, no leukocytosis suspect this is all hypotension from circulatory shock sedation and hypovolemia - Start  Zosyn today - Follow Blood Cx , tracheal aspirate for sensitivities then narrow coverage - Trend WBC and fever curve - re-culture as is clinically indicated  Acute hypoxic respiratory failure with compromised airway. Extubated 09/02/2020 - f/u CXR - wean oxygen as able - Continue diuresis as below   Acute on chronic heart failure preserved ejection fraction HLD. -She appears volume overloaded on exam.   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 - f/u LFTs - hold outpt inderal - Trend ammonia  DM type 2 poorly controlled with hypoglycemia. -Continue tube feeds - SSI - hold outpt victoza, metformin  Anemia of critical illness and chronic disease. 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 - f/u CBC now - transfuse for Hb < 7, PLT < 10K, or significant bleeding   Best practice:  Diet: tube feeds DVT prophylaxis: lovenox GI prophylaxis: protonix Mobility: bed rest Code Status: full code  Disposition:  ICU Grandfather updated at bedside 11/2   Magdalen Spatz, MSN, AGACNP-BC [place] for personal pager PCCM on call pager [phone] 09/03/2020 8:39 AM    CC APP time 35 minutes  Labs    CMP Latest Ref Rng & Units 09/02/2020 09/02/2020 09/01/2020  Glucose 70 - 99 mg/dL - 127(H) 149(H)  BUN 6 - 20 mg/dL - 10 5(L)  Creatinine 0.44 - 1.00 mg/dL - 0.57 0.60  Sodium 135 - 145 mmol/L 141 138 137  Potassium 3.5 - 5.1 mmol/L 4.0 4.3 4.4   Chloride 98 - 111 mmol/L - 108 107  CO2 22 - 32 mmol/L - 23 23  Calcium 8.9 - 10.3 mg/dL - 7.6(L) 7.7(L)  Total Protein 6.5 - 8.1 g/dL - 5.8(L) 5.9(L)  Total Bilirubin 0.3 - 1.2 mg/dL - 0.7 1.0  Alkaline Phos 38 - 126 U/L - 73 76  AST 15 - 41 U/L - 83(H) 82(H)  ALT 0 - 44 U/L - 34 34    CBC Latest Ref Rng & Units 09/02/2020 09/02/2020 09/01/2020  WBC 4.0 - 10.5 K/uL - 3.6(L) 5.5  Hemoglobin 12.0 - 15.0 g/dL 8.2(L) 8.3(L) 8.9(L)  Hematocrit 36 - 46 % 24.0(L) 25.5(L) 28.3(L)  Platelets 150 - 400 K/uL - 40(L) 85(L)    ABG    Component Value Date/Time   PHART 7.455 (H) 09/02/2020 1848   PCO2ART 39.8 09/02/2020 1848   PO2ART 96 09/02/2020 1848   HCO3 27.9 09/02/2020 1848   TCO2 29 09/02/2020 1848   ACIDBASEDEF 1.6 08/31/2020 1036   O2SAT 98.0 09/02/2020 1848    Recent Labs  Lab 08/31/20 1452 09/01/20 1151  AMMONIA 67* 70*    CBG: Recent Labs  Lab 09/02/20 1537 09/02/20 1910 09/02/20 2315 09/03/20 0304 09/03/20 0720  GLUCAP 180* 170* 164* 164* 173*

## 2020-09-03 NOTE — Progress Notes (Addendum)
Nutrition Follow-up  DOCUMENTATION CODES:   Morbid obesity  INTERVENTION:   - Ensure Enlive po BID, each supplement provides 350 kcal and 20 grams of protein  - Encourage adequate PO intake  NUTRITION DIAGNOSIS:   Inadequate oral intake related to acute illness as evidenced by NPO status.  Progressing, pt now on dysphagia 3 diet  GOAL:   Patient will meet greater than or equal to 90% of their needs  Progressing  MONITOR:   PO intake, Supplement acceptance, Labs, Weight trends, Skin, I & O's  REASON FOR ASSESSMENT:   Ventilator, Consult Enteral/tube feeding initiation and management  ASSESSMENT:   34 year old female who presented to the ED on 10/30 after being found unresponsive by family with hypoglycemia. PMH of hepatitis C s/p treatment, NASH, cirrhosis, IVDA (abstained for 10 years), T2DM, CHF, COPD. Pt intubated for airway protection.  11/01 - extubated 11/02 - diet advanced to dysphagia 3  Discussed pt with RN and during ICU rounds. Precedex off. Pt passed bedside swallow evaluation and diet advanced to dysphagia 3 with thin liquids. RD to order oral nutrition supplements to aid pt in meeting kcal and protein needs.  Spoke with pt's family member at bedside who reports that pt typically eats small portions at meals but "eats a lot of the food that she shouldn't." He denies pt having any nutrition-related issues PTA.  EDW: 130 kg  Medications reviewed and include: lasix, SSI q 4 hours, protonix, IV abx  Labs reviewed: ionized calcium 1.12, hemoglobin 8.2 CBG's: 164-180 x 24 hours  UOP: 3375 x 24 hours I/O's: +3.0 L since admit  NUTRITION - FOCUSED PHYSICAL EXAM:    Most Recent Value  Orbital Region No depletion  Upper Arm Region No depletion  Thoracic and Lumbar Region No depletion  Buccal Region No depletion  Temple Region No depletion  Clavicle Bone Region No depletion  Clavicle and Acromion Bone Region No depletion  Scapular Bone Region Unable to  assess  Dorsal Hand No depletion  Patellar Region No depletion  Anterior Thigh Region Mild depletion  Posterior Calf Region Mild depletion  Edema (RD Assessment) Moderate  [BUE, BLE]  Hair Reviewed  Eyes Reviewed  Mouth Reviewed  Skin Reviewed  Nails Reviewed       Diet Order:   Diet Order            DIET DYS 3 Room service appropriate? Yes; Fluid consistency: Thin  Diet effective now                 EDUCATION NEEDS:   Not appropriate for education at this time  Skin:  Skin Assessment: Skin Integrity Issues: DTI: coccyx Stage I: sacrum  Last BM:  no documented BM  Height:   Ht Readings from Last 1 Encounters:  08/31/20 5' 9"  (1.753 m)    Weight:   Wt Readings from Last 1 Encounters:  09/03/20 (!) 146.5 kg    Ideal Body Weight:  65.9 kg  BMI:  Body mass index is 47.69 kg/m.  Estimated Nutritional Needs:   Kcal:  2200-2400  Protein:  110-130 grams  Fluid:  >/= 1.8 L    Jaime Face, MS, RD, LDN Inpatient Clinical Dietitian Please see AMiON for contact information.

## 2020-09-04 ENCOUNTER — Ambulatory Visit: Payer: Medicaid Other | Admitting: Nurse Practitioner

## 2020-09-04 ENCOUNTER — Encounter: Payer: Medicaid Other | Attending: "Endocrinology | Admitting: Nutrition

## 2020-09-04 ENCOUNTER — Inpatient Hospital Stay (HOSPITAL_COMMUNITY): Payer: Medicaid Other

## 2020-09-04 DIAGNOSIS — J9601 Acute respiratory failure with hypoxia: Secondary | ICD-10-CM | POA: Diagnosis not present

## 2020-09-04 DIAGNOSIS — J156 Pneumonia due to other aerobic Gram-negative bacteria: Secondary | ICD-10-CM | POA: Diagnosis not present

## 2020-09-04 DIAGNOSIS — N390 Urinary tract infection, site not specified: Secondary | ICD-10-CM | POA: Diagnosis not present

## 2020-09-04 DIAGNOSIS — G9340 Encephalopathy, unspecified: Secondary | ICD-10-CM | POA: Diagnosis not present

## 2020-09-04 DIAGNOSIS — B952 Enterococcus as the cause of diseases classified elsewhere: Secondary | ICD-10-CM

## 2020-09-04 LAB — COMPREHENSIVE METABOLIC PANEL
ALT: 31 U/L (ref 0–44)
AST: 46 U/L — ABNORMAL HIGH (ref 15–41)
Albumin: 2.4 g/dL — ABNORMAL LOW (ref 3.5–5.0)
Alkaline Phosphatase: 82 U/L (ref 38–126)
Anion gap: 9 (ref 5–15)
BUN: 12 mg/dL (ref 6–20)
CO2: 26 mmol/L (ref 22–32)
Calcium: 8 mg/dL — ABNORMAL LOW (ref 8.9–10.3)
Chloride: 104 mmol/L (ref 98–111)
Creatinine, Ser: 0.56 mg/dL (ref 0.44–1.00)
GFR, Estimated: >60 mL/min (ref >60)
Glucose, Bld: 153 mg/dL — ABNORMAL HIGH (ref 70–99)
Potassium: 3.7 mmol/L (ref 3.5–5.1)
Sodium: 139 mmol/L (ref 135–145)
Total Bilirubin: 0.6 mg/dL (ref 0.3–1.2)
Total Protein: 6.1 g/dL — ABNORMAL LOW (ref 6.5–8.1)

## 2020-09-04 LAB — CBC
HCT: 27.1 % — ABNORMAL LOW (ref 36.0–46.0)
Hemoglobin: 8.8 g/dL — ABNORMAL LOW (ref 12.0–15.0)
MCH: 29.7 pg (ref 26.0–34.0)
MCHC: 32.5 g/dL (ref 30.0–36.0)
MCV: 91.6 fL (ref 80.0–100.0)
Platelets: 70 10*3/uL — ABNORMAL LOW (ref 150–400)
RBC: 2.96 MIL/uL — ABNORMAL LOW (ref 3.87–5.11)
RDW: 16.7 % — ABNORMAL HIGH (ref 11.5–15.5)
WBC: 3.4 10*3/uL — ABNORMAL LOW (ref 4.0–10.5)
nRBC: 0 % (ref 0.0–0.2)

## 2020-09-04 LAB — GLUCOSE, CAPILLARY
Glucose-Capillary: 127 mg/dL — ABNORMAL HIGH (ref 70–99)
Glucose-Capillary: 128 mg/dL — ABNORMAL HIGH (ref 70–99)
Glucose-Capillary: 178 mg/dL — ABNORMAL HIGH (ref 70–99)
Glucose-Capillary: 149 mg/dL — ABNORMAL HIGH (ref 70–99)
Glucose-Capillary: 157 mg/dL — ABNORMAL HIGH (ref 70–99)
Glucose-Capillary: 120 mg/dL — ABNORMAL HIGH (ref 70–99)

## 2020-09-04 LAB — MAGNESIUM: Magnesium: 2 mg/dL (ref 1.7–2.4)

## 2020-09-04 LAB — AMMONIA: Ammonia: 52 umol/L — ABNORMAL HIGH (ref 9–35)

## 2020-09-04 MED ORDER — POTASSIUM CHLORIDE CRYS ER 20 MEQ PO TBCR
40.0000 meq | EXTENDED_RELEASE_TABLET | Freq: Once | ORAL | Status: AC
  Administered 2020-09-04: 40 meq via ORAL
  Filled 2020-09-04: qty 2

## 2020-09-04 MED ORDER — POLYETHYLENE GLYCOL 3350 17 G PO PACK
17.0000 g | PACK | Freq: Every day | ORAL | Status: DC
  Administered 2020-09-04 – 2020-09-09 (×6): 17 g via ORAL
  Filled 2020-09-04 (×6): qty 1

## 2020-09-04 MED ORDER — METHADONE HCL 5 MG PO TABS
30.0000 mg | ORAL_TABLET | Freq: Every day | ORAL | Status: DC
  Administered 2020-09-04 – 2020-09-09 (×6): 30 mg via ORAL
  Filled 2020-09-04 (×6): qty 6

## 2020-09-04 MED ORDER — DOCUSATE SODIUM 100 MG PO CAPS
100.0000 mg | ORAL_CAPSULE | Freq: Every day | ORAL | Status: DC
  Administered 2020-09-04 – 2020-09-09 (×6): 100 mg via ORAL
  Filled 2020-09-04 (×6): qty 1

## 2020-09-04 MED FILL — Norepinephrine-Dextrose IV Solution 4 MG/250ML-5%: INTRAVENOUS | Qty: 250 | Status: AC

## 2020-09-04 NOTE — Progress Notes (Signed)
NAME:  Jaime [name], MRN:  [id number], DOB:  04-13-1986, LOS: 4 ADMISSION DATE:  08/31/2020, CONSULTATION DATE:  10/31 REFERRING MD:  Regenia Skeeter, CHIEF COMPLAINT:  Acute metabolic vs toxic encephalopathy    Brief History   34 year old female found by family unresponsive. Glucose in 30s. No sig response clinically to Narcan or glucose. Intubated for airway protection. Sent to [facility] for further Eval from [facility] ER.   History of present illness   34 year old female w/ hx as outlined below. Presented to the ER after being found by family unresponsive in a chair (still had dinner from prior night in front of her). EMS noted blood sugar 38 on arrival->58 after given glucagon. This did not result in improved mentation. On arrival to ER was initially completely unresponsive but spontaneously breathing. Given glucose and Narcan. This resulted in her then responding to pain and moving spontaneously but would not follow commands. She was intubated for airway protection and [facility]   Past Medical History  Hep C s/p treatment, NAFLD/NASH, Cirrhosis, IV drug abuse (abstained for 54yr), DM, MO, chronic dyspnea, HFpEF ->just discharged 10/16 after being treated for sepsis source never clearly identified, c/b volume overload (had 20lb wt gain)  Significant Hospital Events   Initial dx findings: cbg 51, BNP 131, nml cr and BUN, WBC ct nml, UA unremarkable, ethanol <<49 apap and salicylate neg, rapid UDS: negative,   Consults:    Procedures:  ETT 10/30 >>  Significant Diagnostic Tests:   Echo 9/29: Left ventricular ejection fraction, by estimation, is 55 to 60%. The left ventricle has normal function.Left ventricular  diastolic parameters are Indeterminate. RV, RA size and RV fxn nml.   CT head 10/30: negative   EEG 10/30: generalized slowing  MRI performed 10/31 shows no acute intracranial process  Micro Data:  BCX2 10/30>>>no growth to date UC 10/30>>> Enterococcus  Faecalis SARS2: negative Tracheal Aspirate 10/31>> MODERATE SERRATIA MARCESCENS  Antimicrobials:  vanc 10/30 >> 10/31 Cefepime 10/30 >>   Interim history/subjective:  On no sedation. Resting comfortably. No overnight issues.  Objective   Blood pressure 124/77, pulse 93, temperature 97.6 F (36.4 C), temperature source Oral, resp. rate (!) 23, height 5' 9"  (1.753 m), weight (!) 144.2 kg, SpO2 96 %.        Intake/Output Summary (Last 24 hours) at 09/04/2020 0825 Last data filed at 09/04/2020 0700 Gross per 24 hour  Intake 1673.69 ml  Output 2750 ml  Net -1076.31 ml   Filed Weights   09/02/20 0400 09/03/20 0500 09/04/20 0358  Weight: (!) 150.5 kg (!) 146.5 kg (!) 144.2 kg    Examination:  General - 34year old female resting comfortably Eyes - pupils reactive, equal Cardiac - S1, S2, RRR, No RMG Chest - Bilateral breath sounds, Coarse , diminished per bases Abdomen - Obese, NT, ND, BS + Extremities - 2+ edema upper extremities, 1 + Lower extremities, no obvious deformities Skin - no rashes, no lesions, + tattoos Neuro - somnolent. Arouses to verbal stimuli and follows commands.  Resolved Hospital Problem list   Sedation induced hypotension Respiratory failure   Assessment & Plan:   Acute metabolic encephalopathy from hypoglycemia. Hx of IVDA with chronic methadone use. Polypharmacy. - MRI unremarkable. Gradually improving - still holding outpt klonopin  flexeril, , lyrica, will need to gradually add back - resumed home seroquel, effexor. Will add back methodone today - Trend Qtc - trend ammonia level - Trend LFT's prn  Sepsis from aspiration pneumonitis. Urine Cx + for enterococcus faecalis Sputum Cx + for Seratia Marcescens - ampicillin total 3 days abx for UTI - total 5 days for serratia pneumonia  Acute on chronic heart failure preserved ejection fraction HLD. -She appears volume overloaded on exam.   - continue lasix. 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 - f/u LFTs - hold outpt inderal - Trend ammonia  DM type 2 poorly controlled with hypoglycemia. -carb controlled diet - SSI - hold outpt victoza, metformin  Anemia of critical illness and chronic disease. 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 - f/u CBC now - transfuse for Hb < 7, PLT < 10K, or significant bleeding   Best practice:  Diet: carb controlled diet DVT prophylaxis: lovenox GI prophylaxis: protonix Mobility: bed rest Code Status: full code  Disposition:  [place] for transfer to [facility]. Will transfer and s/o to [facility] to assume care 11/4. Grandfather updated at bedside 11/2   [facility] 09/04/2020 8:25 AM  Pager: [phone] After hours pager: [phone]   Labs    CMP Latest Ref Rng & Units 09/04/2020 09/03/2020 09/02/2020  Glucose 70 - 99 mg/dL 153(H) 187(H) -  BUN 6 - 20 mg/dL 12 14 -  Creatinine 0.44 - 1.00 mg/dL 0.56 0.68 -  Sodium 135 - 145 mmol/L 139 139 141  Potassium 3.5 - 5.1 mmol/L 3.7 4.3 4.0  Chloride 98 - 111 mmol/L 104 104 -  CO2 22 - 32 mmol/L 26 25 -  Calcium 8.9 - 10.3 mg/dL 8.0(L) 8.1(L) -  Total Protein 6.5 - 8.1 g/dL 6.1(L) - -  Total Bilirubin 0.3 - 1.2 mg/dL 0.6 - -  Alkaline Phos 38 - 126 U/L 82 - -  AST 15 - 41 U/L 46(H) - -  ALT 0 - 44 U/L 31 - -    CBC Latest Ref Rng & Units 09/04/2020 09/03/2020 09/02/2020  WBC 4.0 - 10.5 K/uL 3.4(L) 2.9(L) -  Hemoglobin 12.0 - 15.0 g/dL 8.8(L) 8.6(L) 8.2(L)  Hematocrit 36 - 46 % 27.1(L) 26.8(L) 24.0(L)  Platelets 150 - 400 K/uL 70(L) 69(L) -    ABG    Component Value Date/Time   PHART 7.455 (H) 09/02/2020 1848   PCO2ART 39.8 09/02/2020 1848   PO2ART 96 09/02/2020 1848   HCO3 27.9 09/02/2020 1848   TCO2 29 09/02/2020 1848   ACIDBASEDEF 1.6 08/31/2020 1036   O2SAT 98.0 09/02/2020 1848    Recent Labs  Lab 08/31/20 1452 09/01/20 1151 09/03/20 0926 09/04/20 0110  AMMONIA  67* 70* 48* 52*    CBG: Recent Labs  Lab 09/03/20 1605 09/03/20 1922 09/03/20 2307 09/04/20 0321 09/04/20 0709  GLUCAP 166* 196* 149* 127* 128*

## 2020-09-04 NOTE — Plan of Care (Signed)

## 2020-09-04 NOTE — Progress Notes (Signed)
Patient transported to 2W  Without distress. RN at bedside upon arrival. Report given to [facility]. Duncan Dull at bedside and Ivin Booty (mother) called and updated.

## 2020-09-05 DIAGNOSIS — F4321 Adjustment disorder with depressed mood: Secondary | ICD-10-CM

## 2020-09-05 LAB — GLUCOSE, CAPILLARY
Glucose-Capillary: 121 mg/dL — ABNORMAL HIGH (ref 70–99)
Glucose-Capillary: 126 mg/dL — ABNORMAL HIGH (ref 70–99)
Glucose-Capillary: 127 mg/dL — ABNORMAL HIGH (ref 70–99)
Glucose-Capillary: 130 mg/dL — ABNORMAL HIGH (ref 70–99)
Glucose-Capillary: 132 mg/dL — ABNORMAL HIGH (ref 70–99)
Glucose-Capillary: 155 mg/dL — ABNORMAL HIGH (ref 70–99)

## 2020-09-05 LAB — BASIC METABOLIC PANEL
Anion gap: 9 (ref 5–15)
BUN: 11 mg/dL (ref 6–20)
CO2: 27 mmol/L (ref 22–32)
Calcium: 8 mg/dL — ABNORMAL LOW (ref 8.9–10.3)
Chloride: 105 mmol/L (ref 98–111)
Creatinine, Ser: 0.57 mg/dL (ref 0.44–1.00)
GFR, Estimated: >60 mL/min (ref >60)
Glucose, Bld: 121 mg/dL — ABNORMAL HIGH (ref 70–99)
Potassium: 3.3 mmol/L — ABNORMAL LOW (ref 3.5–5.1)
Sodium: 141 mmol/L (ref 135–145)

## 2020-09-05 LAB — CBC
HCT: 28.5 % — ABNORMAL LOW (ref 36.0–46.0)
Hemoglobin: 9.1 g/dL — ABNORMAL LOW (ref 12.0–15.0)
MCH: 29.4 pg (ref 26.0–34.0)
MCHC: 31.9 g/dL (ref 30.0–36.0)
MCV: 92.2 fL (ref 80.0–100.0)
Platelets: 71 10*3/uL — ABNORMAL LOW (ref 150–400)
RBC: 3.09 MIL/uL — ABNORMAL LOW (ref 3.87–5.11)
RDW: 17.2 % — ABNORMAL HIGH (ref 11.5–15.5)
WBC: 3.8 10*3/uL — ABNORMAL LOW (ref 4.0–10.5)
nRBC: 0.5 % — ABNORMAL HIGH (ref 0.0–0.2)

## 2020-09-05 LAB — AMMONIA: Ammonia: 54 umol/L — ABNORMAL HIGH (ref 9–35)

## 2020-09-05 LAB — CULTURE, BLOOD (ROUTINE X 2)
Culture: NO GROWTH
Special Requests: ADEQUATE

## 2020-09-05 MED ORDER — LACTULOSE 10 GM/15ML PO SOLN
10.0000 g | Freq: Three times a day (TID) | ORAL | Status: DC
  Administered 2020-09-05 – 2020-09-09 (×13): 10 g via ORAL
  Filled 2020-09-05 (×12): qty 15

## 2020-09-05 NOTE — Plan of Care (Signed)

## 2020-09-05 NOTE — Progress Notes (Signed)
Received patient for care around 1900 from [place]. MAR, chart, and labs reviewed. Patient alert and unable to assess orientation status. Patient did not verbalize any words this shift. Skin assessed with no new skin issues noted. Patient turned and repositioned every 2 hours and as needed with no assist. Patient incontinent x2.  Purwick in place. Vitals stable this shift. Call light in reach, bed in low position, wheels locked. No acute events this shift. Will continue to monitor for signs and symptoms of deterioration and report as needed.

## 2020-09-05 NOTE — Progress Notes (Signed)
PROGRESS NOTE    Jaime [name]  WJX:[id number] DOB: 07/10/1986 DOA: 08/31/2020 PCP: Neale Burly, MD   Chief Complain: Unresponsive, hypoglycemia  Brief Narrative:  Patient is a 34 year old female with past medical history of polysubstance abuse, 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 She was also found to be hypoglycemic. She did not respond to Narcan or glucose. She was intubated for airway protection and was sent to [facility] from [place]. Currently her respiratory status is stable, status post extubation. Patient transferred to [facility] service on 09/05/20.  Hospital course remarkable for persistent altered mental status  Assessment & Plan:   Active Problems:   Pressure injury of skin   Respiratory failure (HCC)   Acute encephalopathy   Acute metabolic encephalopathy: Thought to be secondary to hypoglycemia, polypharmacy. 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 MRI of the brain did not show any acute intracranial abnormalities. None also in CT.She was taking Klonopin, Flexeril, Lyrica which have are on hold. Resumed home Seroquel, Effexor and methadone.  She is alert and awake but continues to moan and does not talk, does not answer questions.  Will check ammonia level.  Acute hypoxic respiratory failure: She was intubated for airway protection. Hospital course remarkable for aspiration pneumonitis. Currently on room air.  Aspiration pneumonitis: Sputum culture positive for Serratia marcescens.On cefepime Continue current antibiotics.  UTI: Urine culture positive for Enterococcus faecalis. On ampicillin.  Acute on chronic diastolic congestive heart failure: Appeared volume overloaded on presentation. Takes Lasix at home. Continue current Lasix regimen. Also taking lisinopril, Crestor and Aldactone at home.  Cirrhosis/NASH/hepatitis C: She says she has been  treated for hepatitis C. 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 She has chronic anasarca/ascites. Follow-up ammonia level. Continue lactulose. liver enzymes are almost normal.  Diabetes type 2: On Victoza, Metformin at home. Continue sliding Zelson here.  Pancytopenia: Likely associated with chronic liver disease/chronic medical problems. Also has thrombocytopenia in the setting of cirrhosis. Continue to monitor CBC.  Hypokalemia: Being supplemented with potassium.  Morbid obesity: BMI of 46.9  Debility/deconditioning: We will request a PT/OT evaluation      Nutrition Problem: Inadequate oral intake Etiology: acute illness      DVT prophylaxis:Lovenox Code Status: Full Family Communication: Grandfather present at the bedside Status is: Inpatient  Remains inpatient appropriate because:Inpatient level of care appropriate due to severity of illness   Dispo: The patient is from: Home              Anticipated d/c is to: Home              Anticipated d/c date is: 2 days              Patient currently is not medically stable to d/c.     Consultants: PCCM  Procedures:Intubation  Antimicrobials:  Anti-infectives (From admission, onward)   Start     Dose/Rate Route Frequency Ordered Stop   09/03/20 1400  ceFEPIme (MAXIPIME) 2 g in sodium chloride 0.9 % 100 mL IVPB        2 g 200 mL/hr over 30 Minutes Intravenous Every 8 hours 09/03/20 1233 09/08/20 1400   09/03/20 1330  ampicillin (OMNIPEN) 2 g in sodium chloride 0.9 % 100 mL IVPB        2 g 300 mL/hr over 20 Minutes Intravenous Every 4 hours 09/03/20 1233 09/06/20 1330  09/03/20 0930  piperacillin-tazobactam (ZOSYN) IVPB 3.375 g  Status:  Discontinued        3.375 g 12.5 mL/hr over 240 Minutes Intravenous Every 8 hours 09/03/20 0844 09/03/20 1233   08/31/20 2015  ceFEPIme (MAXIPIME) 2 g in sodium chloride 0.9 % 100 mL IVPB  Status:  Discontinued        2 g 200 mL/hr over 30 Minutes Intravenous Every 8  hours 08/31/20 1924 09/02/20 0813   08/31/20 1930  linezolid (ZYVOX) IVPB 600 mg  Status:  Discontinued        600 mg 300 mL/hr over 60 Minutes Intravenous Every 12 hours 08/31/20 1922 09/01/20 0810   08/31/20 1830  ceFEPIme (MAXIPIME) 2 g in sodium chloride 0.9 % 100 mL IVPB  Status:  Discontinued        2 g 200 mL/hr over 30 Minutes Intravenous  Once 08/31/20 1817 08/31/20 1924      Subjective:  Patient seen and examined at the bedside this morning.  Hemodynamically stable during my evaluation.  Grandfather was at the bedside.  She was continuously moaning and complaining of pain everywhere.  Did not communicate.  But remains alert and awake.  Objective: Vitals:   09/04/20 1214 09/04/20 2123 09/05/20 0517 09/05/20 0715  BP:  124/79 (!) 107/54   Pulse: 100 (!) 109 (!) 105   Resp:  20 17   Temp:  98.5 F (36.9 C) 98 F (36.7 C)   TempSrc:  Oral Oral   SpO2:  95% 95% 95%  Weight:      Height:        Intake/Output Summary (Last 24 hours) at 09/05/2020 0835 Last data filed at 09/05/2020 9233 Gross per 24 hour  Intake 1693.3 ml  Output 1800 ml  Net -106.7 ml   Filed Weights   09/02/20 0400 09/03/20 0500 09/04/20 0358  Weight: (!) 150.5 kg (!) 146.5 kg (!) 144.2 kg    Examination:  General exam: Not in distress, morbidly  Obese,anasarca,moaning HEENT:PERRL,Oral mucosa moist, Ear/Nose normal on gross exam Respiratory system: Bilateral equal air entry, normal vesicular breath sounds, no wheezes or crackles  Cardiovascular system: S1 & S2 heard, RRR. No JVD, murmurs, rubs, gallops or clicks.1-2+ bilateral  pedal edema. Gastrointestinal system: Abdomen is distended, soft and nontender. No organomegaly or masses felt. Normal bowel sounds heard. Central nervous system: Alert and awake but not oriented. No focal neurological deficits. Extremities: No edema, no clubbing ,no cyanosis Skin: No rashes, lesions or ulcers,no icterus ,no pallor  Data Reviewed: I have personally  reviewed following labs and imaging studies  CBC: Recent Labs  Lab 08/31/20 0921 08/31/20 1934 09/01/20 0255 09/01/20 0255 09/02/20 0345 09/02/20 1848 09/03/20 0803 09/04/20 0110 09/05/20 0027  WBC 9.8  --  5.5  --  3.6*  --  2.9* 3.4* 3.8*  NEUTROABS 8.2*  --   --   --   --   --   --   --   --   HGB 10.4*   < > 8.9*   < > 8.3* 8.2* 8.6* 8.8* 9.1*  HCT 32.4*   < > 28.3*   < > 25.5* 24.0* 26.8* 27.1* 28.5*  MCV 91.3  --  91.9  --  92.7  --  90.8 91.6 92.2  PLT 137*  --  85*  --  40*  --  69* 70* 71*   < > = values in this interval not displayed.   Basic Metabolic Panel: Recent Labs  Lab 09/01/20  4193 09/01/20 0255 09/02/20 0345 09/02/20 1848 09/03/20 0803 09/04/20 0110 09/05/20 0027  NA 137   < > 138 141 139 139 141  K 4.4   < > 4.3 4.0 4.3 3.7 3.3*  CL 107  --  108  --  104 104 105  CO2 23  --  23  --  25 26 27   GLUCOSE 149*  --  127*  --  187* 153* 121*  BUN 5*  --  10  --  14 12 11   CREATININE 0.60  0.61  --  0.57  --  0.68 0.56 0.57  CALCIUM 7.7*  --  7.6*  --  8.1* 8.0* 8.0*  MG  --   --  2.1  --   --  2.0  --   PHOS  --   --  2.8  --   --   --   --    < > = values in this interval not displayed.   GFR: Estimated Creatinine Clearance: 149.5 mL/min (by C-G formula based on SCr of 0.57 mg/dL). Liver Function Tests: Recent Labs  Lab 08/31/20 0921 09/01/20 0255 09/02/20 0345 09/04/20 0110  AST 30 82* 83* 46*  ALT 30 34 34 31  ALKPHOS 87 76 73 82  BILITOT 1.0 1.0 0.7 0.6  PROT 6.6 5.9* 5.8* 6.1*  ALBUMIN 2.6* 2.3* 2.2* 2.4*   No results for input(s): LIPASE, AMYLASE in the last 168 hours. Recent Labs  Lab 08/31/20 1452 09/01/20 1151 09/03/20 0926 09/04/20 0110  AMMONIA 67* 70* 48* 52*   Coagulation Profile: Recent Labs  Lab 09/01/20 0719 09/02/20 0656  INR 1.1 1.2   Cardiac Enzymes: No results for input(s): CKTOTAL, CKMB, CKMBINDEX, TROPONINI in the last 168 hours. BNP (last 3 results) No results for input(s): PROBNP in the last 8760  hours. HbA1C: No results for input(s): HGBA1C in the last 72 hours. CBG: Recent Labs  Lab 09/04/20 1622 09/04/20 1926 09/04/20 2305 09/05/20 0308 09/05/20 0729  GLUCAP 149* 157* 120* 132* 121*   Lipid Profile: No results for input(s): CHOL, HDL, LDLCALC, TRIG, CHOLHDL, LDLDIRECT in the last 72 hours. Thyroid Function Tests: No results for input(s): TSH, T4TOTAL, FREET4, T3FREE, THYROIDAB in the last 72 hours. Anemia Panel: No results for input(s): VITAMINB12, FOLATE, FERRITIN, TIBC, IRON, RETICCTPCT in the last 72 hours. Sepsis Labs: Recent Labs  Lab 08/31/20 0916 09/01/20 0255  LATICACIDVEN 1.1 2.5*    Recent Results (from the past 240 hour(s))  Urine culture     Status: Abnormal   Collection Time: 08/31/20  9:21 AM   Specimen: Urine, Clean Catch  Result Value Ref Range Status   Specimen Description   Final    URINE, CLEAN CATCH Performed at [facility], [address], [place], [place] [address]    Special Requests   Final    NONE Performed at [facility], [address], [place], [place] [address]    Culture >=100,000 COLONIES/mL ENTEROCOCCUS FAECALIS (A)  Final   Report Status 09/03/2020 FINAL  Final   Organism ID, Bacteria ENTEROCOCCUS FAECALIS (A)  Final      Susceptibility   Enterococcus faecalis - MIC*    AMPICILLIN <=2 SENSITIVE Sensitive     NITROFURANTOIN <=16 SENSITIVE Sensitive     VANCOMYCIN 1 SENSITIVE Sensitive     * >=100,000 COLONIES/mL ENTEROCOCCUS FAECALIS  Respiratory Panel by RT PCR (Flu A&B, Covid) - Nasopharyngeal Swab     Status: None   Collection Time: 08/31/20  9:34 AM  Specimen: Nasopharyngeal Swab  Result Value Ref Range Status   SARS Coronavirus 2 by RT PCR NEGATIVE NEGATIVE Final    Comment: (NOTE) SARS-CoV-2 target nucleic acids are NOT DETECTED.  The SARS-CoV-2 RNA is generally detectable in upper respiratoy specimens during the acute phase of infection. The lowest concentration of SARS-CoV-2 viral copies this assay can  detect is 131 copies/mL. A negative result does not preclude SARS-Cov-2 infection and should not be used as the sole basis for treatment or other patient management decisions. A negative result may occur with  improper specimen collection/handling, submission of specimen other than nasopharyngeal swab, presence of viral mutation(s) within the areas targeted by this assay, and inadequate number of viral copies (<131 copies/mL). A negative result must be combined with clinical observations, patient history, and epidemiological information. The expected result is Negative.  Fact Sheet for Patients:  PinkCheek.be  Fact Sheet for Healthcare Providers:  GravelBags.it  This test is no t yet approved or cleared by the [place] FDA and  has been authorized for detection and/or diagnosis of SARS-CoV-2 by FDA under an Emergency Use Authorization (EUA). This EUA will remain  in effect (meaning this test can be used) for the duration of the COVID-19 declaration under Section 564(b)(1) of the Act, 21 U.S.C. section 360bbb-3(b)(1), unless the authorization is terminated or revoked sooner.     Influenza A by PCR NEGATIVE NEGATIVE Final   Influenza B by PCR NEGATIVE NEGATIVE Final    Comment: (NOTE) The Xpert Xpress SARS-CoV-2/FLU/RSV assay is intended as an aid in  the diagnosis of influenza from Nasopharyngeal swab specimens and  should not be used as a sole basis for treatment. Nasal washings and  aspirates are unacceptable for Xpert Xpress SARS-CoV-2/FLU/RSV  testing.  Fact Sheet for Patients: PinkCheek.be  Fact Sheet for Healthcare Providers: GravelBags.it  This test is not yet approved or cleared by the [place] FDA and  has been authorized for detection and/or diagnosis of SARS-CoV-2 by  FDA under an Emergency Use Authorization (EUA). This EUA will remain  in  effect (meaning this test can be used) for the duration of the  Covid-19 declaration under Section 564(b)(1) of the Act, 21  U.S.C. section 360bbb-3(b)(1), unless the authorization is  terminated or revoked. Performed at [facility], [address], [place], [place] [address]   Culture, blood (routine x 2)     Status: None (Preliminary result)   Collection Time: 08/31/20 10:18 AM   Specimen: BLOOD  Result Value Ref Range Status   Specimen Description   Final    BLOOD BLOOD LEFT ARM Performed at [facility], [place] [address] [place], [place] [address]    Special Requests   Final    BOTTLES DRAWN AEROBIC AND ANAEROBIC Blood Culture adequate volume Performed at [facility], [place] [address] [place], [place] [address]    Culture   Final    NO GROWTH 4 DAYS Performed at [facility], [address] [place], [place] [address]    Report Status PENDING  Incomplete  MRSA PCR Screening     Status: None   Collection Time: 08/31/20  6:27 PM   Specimen: Nasopharyngeal  Result Value Ref Range Status   MRSA by PCR NEGATIVE NEGATIVE Final    Comment:        The GeneXpert MRSA Assay (FDA approved for NASAL specimens only), is one component of a comprehensive MRSA colonization surveillance program. It is not intended to diagnose MRSA  infection nor to guide or monitor treatment for MRSA infections. Performed at [facility], [place] [address] [place], [place] [address]   Culture, respiratory     Status: None   Collection Time: 09/01/20 12:11 AM   Specimen: Tracheal Aspirate  Result Value Ref Range Status   Specimen Description TRACHEAL ASPIRATE  Final   Special Requests NONE  Final   Gram Stain   Final    FEW WBC PRESENT, PREDOMINANTLY PMN FEW GRAM NEGATIVE RODS RARE GRAM POSITIVE COCCI Performed at [facility], [place] [address], [place], [place] [address]    Culture MODERATE SERRATIA MARCESCENS  Final   Report Status  09/03/2020 FINAL  Final   Organism ID, Bacteria SERRATIA MARCESCENS  Final      Susceptibility   Serratia marcescens - MIC*    CEFAZOLIN >=64 RESISTANT Resistant     CEFEPIME <=0.12 SENSITIVE Sensitive     CEFTAZIDIME <=1 SENSITIVE Sensitive     CEFTRIAXONE <=0.25 SENSITIVE Sensitive     CIPROFLOXACIN <=0.25 SENSITIVE Sensitive     GENTAMICIN <=1 SENSITIVE Sensitive     TRIMETH/SULFA <=20 SENSITIVE Sensitive     * MODERATE SERRATIA MARCESCENS  Culture, blood (single)     Status: None (Preliminary result)   Collection Time: 09/01/20  2:55 AM   Specimen: BLOOD  Result Value Ref Range Status   Specimen Description BLOOD SITE NOT SPECIFIED  Final   Special Requests   Final    BOTTLES DRAWN AEROBIC AND ANAEROBIC Blood Culture adequate volume   Culture   Final    NO GROWTH 4 DAYS Performed at [facility], [address], [place], [place] [address]    Report Status PENDING  Incomplete         Radiology Studies: DG CHEST PORT 1 VIEW  Result Date: 09/04/2020 CLINICAL DATA:  Abnormal respiration EXAM: PORTABLE CHEST 1 VIEW COMPARISON:  2 days ago FINDINGS: Aeration has improved from before. Extubation with stable or improved lung volumes. Cardiomegaly is unchanged. No effusion or pneumothorax IMPRESSION: Interval extubation with improved aeration. Electronically Signed   By: Monte Fantasia M.D.   On: 09/04/2020 06:47        Scheduled Meds: . acetaminophen  650 mg Oral Once  . chlorhexidine  15 mL Mouth Rinse BID  . Chlorhexidine Gluconate Cloth  6 each Topical Daily  . docusate sodium  100 mg Oral Daily  . enoxaparin (LOVENOX) injection  65 mg Subcutaneous Q24H  . feeding supplement  237 mL Oral BID BM  . furosemide  40 mg Intravenous BID  . insulin aspart  0-20 Units Subcutaneous Q4H  . mouth rinse  15 mL Mouth Rinse q12n4p  . methadone  30 mg Oral Daily  . pantoprazole sodium  40 mg Oral Q24H  . polyethylene glycol  17 g Oral Daily  . QUEtiapine  25 mg Oral QHS    . venlafaxine XR  225 mg Oral Q1200   Continuous Infusions: . sodium chloride 10 mL/hr at 09/04/20 2125  . ampicillin (OMNIPEN) IV 2 g (09/05/20 0335)  . ceFEPime (MAXIPIME) IV 2 g (09/05/20 0557)     LOS: 5 days    Time spent: 35 mins.More than 50% of that time was spent in counseling and/or coordination of care.      Shelly Coss, MD [facility] P11/02/2020, 8:35 AM

## 2020-09-05 NOTE — Progress Notes (Signed)
Patient's IV infiltrated upon initial assessment. Patient has been receiving ampicillin and cefepime through line. Called pharmacy. Per pharmacy, no further interventions needed. Site is clean, dry, and no redness noted.

## 2020-09-05 NOTE — Evaluation (Signed)
Physical Therapy Evaluation Patient Details Name: Jaime [name] MRN: [id number] DOB: December 25, 1985 Today's Date: 09/05/2020   History of Present Illness  34 yo female admitted to ED on 10/30 for unresponsiveness, CBG 30. ETT 10/30-11/1, medical workup for acute metabolic vs toxic encephalopathy due to hypoglycemia, polypharmacy. Recent admission for sepsis of unknown PMH includes Hep C s/p treatment, NAFLD/NASH, Cirrhosis, IV drug abuse (abstained for 75yr), DM, MO, chronic dyspnea, HFpEF.  Clinical Impression   Pt presents with generalized weakness, AMS with indiscernible speech, difficulty performing mobility tasks, and decreased activity tolerance. Pt to benefit from acute PT to address deficits. Pt ambulated room distance with use of RW and min PT assist to steady, pt presenting with tachycardia and diaphoresis on exertion. At baseline, pt is completely independent and would like to return to independence. Per pt's grandfather, pt can have increased assist from him, stepfather, and mother as needed on d/c. PT to progress mobility as tolerated, and will continue to follow acutely.      Follow Up Recommendations Home health PT;Supervision/Assistance - 24 hour    Equipment Recommendations  None recommended by PT    Recommendations for Other Services       Precautions / Restrictions Precautions Precautions: Fall Restrictions Weight Bearing Restrictions: No      Mobility  Bed Mobility Overal bed mobility: Needs Assistance Bed Mobility: Supine to Sit     Supine to sit: HOB elevated;Mod assist     General bed mobility comments: Mod assist for trunk elevation, scooting to EOB.    Transfers Overall transfer level: Needs assistance Equipment used: Rolling walker (2 wheeled) Transfers: Sit to/from Stand Sit to Stand: Min assist;From elevated surface         General transfer comment: min assist for power up, steadying. Verbal cuing for hand placement when rising/sitting.  STS x3, from EOB, toilet x2.  Ambulation/Gait Ambulation/Gait assistance: Min assist Gait Distance (Feet): 30 Feet Assistive device: Rolling walker (2 wheeled) Gait Pattern/deviations: Step-through pattern;Decreased stride length;Shuffle;Trunk flexed Gait velocity: decr   General Gait Details: Min assist for steadying, room and hallway navigation, verbal cuing for upright posture and waiting for PT prior to ambulation as pt attempting to move quickly initially.  Stairs            Wheelchair Mobility    Modified [place] (Stroke Patients Only)       Balance Overall balance assessment: Needs assistance Sitting-balance support: No upper extremity supported;Feet supported Sitting balance-Leahy Scale: Fair     Standing balance support: Bilateral upper extremity supported;During functional activity Standing balance-Leahy Scale: Poor Standing balance comment: reliant on external assist                             Pertinent Vitals/Pain Pain Assessment: Faces Faces Pain Scale: Hurts a little bit Pain Location: abdomen Pain Descriptors / Indicators: Sore;Discomfort Pain Intervention(s): Limited activity within patient's tolerance;Monitored during session;Repositioned    Home Living Family/patient expects to be discharged to:: Private residence Living Arrangements: Parent Available Help at Discharge: Family;Available 24 hours/day Type of Home: House Home Access: Stairs to enter Entrance Stairs-Rails: Right;Left;Can reach both Entrance Stairs-Number of Steps: 2 Home Layout: One level Home Equipment: Walker - 2 wheels;Cane - single point      Prior Function Level of Independence: Independent         Comments: household and short distanced community ambulator without AD, drives     Hand Dominance   Dominant Hand: Right  Extremity/Trunk Assessment   Upper Extremity Assessment Upper Extremity Assessment: Defer to OT evaluation    Lower Extremity  Assessment Lower Extremity Assessment: Generalized weakness    Cervical / Trunk Assessment Cervical / Trunk Assessment: Normal  Communication   Communication: No difficulties  Cognition Arousal/Alertness: Awake/alert Behavior During Therapy: WFL for tasks assessed/performed;Flat affect Overall Cognitive Status: Impaired/Different from baseline Area of Impairment: Orientation;Memory;Following commands;Safety/judgement;Problem solving                 Orientation Level: Disoriented to;Time;Situation   Memory: Decreased short-term memory Following Commands: Follows one step commands consistently;Follows one step commands with increased time Safety/Judgement: Decreased awareness of safety;Decreased awareness of deficits   Problem Solving: Slow processing;Decreased initiation;Difficulty sequencing;Requires verbal cues;Requires tactile cues General Comments: Pt answering yes/no only, other speech is indiscernible. All questions asked in yes/no. Pt initially disoriented to month and year, corrects self with prompting. Increased time to follow one-step commands, but does so 100% consistently.      General Comments General comments (skin integrity, edema, etc.): HR 120 bpm during mobility.    Exercises     Assessment/Plan    PT Assessment Patient needs continued PT services  PT Problem List Decreased strength;Decreased mobility;Decreased safety awareness;Decreased activity tolerance;Decreased balance;Decreased knowledge of use of DME;Pain;Cardiopulmonary status limiting activity;Decreased coordination;Obesity       PT Treatment Interventions Therapeutic activities;DME instruction;Gait training;Therapeutic exercise;Patient/family education;Balance training;Stair training;Functional mobility training;Neuromuscular re-education    PT Goals (Current goals can be found in the Care Plan section)  Acute Rehab PT Goals Patient Stated Goal: get pt home PT Goal Formulation: With  patient/family Time For Goal Achievement: 09/19/20 Potential to Achieve Goals: Good    Frequency Min 3X/week   Barriers to discharge        Co-evaluation               AM-PAC PT "6 Clicks" Mobility  Outcome Measure Help needed turning from your back to your side while in a flat bed without using bedrails?: A Little Help needed moving from lying on your back to sitting on the side of a flat bed without using bedrails?: A Lot Help needed moving to and from a bed to a chair (including a wheelchair)?: A Lot Help needed standing up from a chair using your arms (e.g., wheelchair or bedside chair)?: A Little Help needed to walk in hospital room?: A Little Help needed climbing 3-5 steps with a railing? : A Lot 6 Click Score: 15    End of Session   Activity Tolerance: Patient limited by fatigue Patient left: in chair;with call bell/phone within reach;with chair alarm set;with family/visitor present (grandfather called "papa" in room) Nurse Communication: Mobility status PT Visit Diagnosis: Unsteadiness on feet (R26.81);Other abnormalities of gait and mobility (R26.89);Muscle weakness (generalized) (M62.81)    Time: 0086-7619 PT Time Calculation (min) (ACUTE ONLY): 28 min   Charges:   PT Evaluation $PT Eval Low Complexity: 1 Low PT Treatments $Gait Training: 8-22 mins       Tayari Yankee E, PT Acute Rehabilitation Services Pager [phone]  Office [phone]    Elzie Knisley D Elonda Husky 09/05/2020, 4:48 PM

## 2020-09-06 LAB — GLUCOSE, CAPILLARY
Glucose-Capillary: 114 mg/dL — ABNORMAL HIGH (ref 70–99)
Glucose-Capillary: 114 mg/dL — ABNORMAL HIGH (ref 70–99)
Glucose-Capillary: 163 mg/dL — ABNORMAL HIGH (ref 70–99)
Glucose-Capillary: 195 mg/dL — ABNORMAL HIGH (ref 70–99)
Glucose-Capillary: 163 mg/dL — ABNORMAL HIGH (ref 70–99)
Glucose-Capillary: 133 mg/dL — ABNORMAL HIGH (ref 70–99)

## 2020-09-06 LAB — BASIC METABOLIC PANEL
Anion gap: 9 (ref 5–15)
BUN: 9 mg/dL (ref 6–20)
CO2: 26 mmol/L (ref 22–32)
Calcium: 8.4 mg/dL — ABNORMAL LOW (ref 8.9–10.3)
Chloride: 107 mmol/L (ref 98–111)
Creatinine, Ser: 0.56 mg/dL (ref 0.44–1.00)
GFR, Estimated: >60 mL/min (ref >60)
Glucose, Bld: 124 mg/dL — ABNORMAL HIGH (ref 70–99)
Potassium: 3.3 mmol/L — ABNORMAL LOW (ref 3.5–5.1)
Sodium: 142 mmol/L (ref 135–145)

## 2020-09-06 LAB — CBC
HCT: 29.4 % — ABNORMAL LOW (ref 36.0–46.0)
Hemoglobin: 9.4 g/dL — ABNORMAL LOW (ref 12.0–15.0)
MCH: 29.6 pg (ref 26.0–34.0)
MCHC: 32 g/dL (ref 30.0–36.0)
MCV: 92.5 fL (ref 80.0–100.0)
Platelets: 73 10*3/uL — ABNORMAL LOW (ref 150–400)
RBC: 3.18 MIL/uL — ABNORMAL LOW (ref 3.87–5.11)
RDW: 17.1 % — ABNORMAL HIGH (ref 11.5–15.5)
WBC: 2.9 10*3/uL — ABNORMAL LOW (ref 4.0–10.5)
nRBC: 0 % (ref 0.0–0.2)

## 2020-09-06 LAB — AMMONIA: Ammonia: 44 umol/L — ABNORMAL HIGH (ref 9–35)

## 2020-09-06 LAB — CULTURE, BLOOD (SINGLE)
Culture: NO GROWTH
Special Requests: ADEQUATE

## 2020-09-06 LAB — MAGNESIUM: Magnesium: 1.8 mg/dL (ref 1.7–2.4)

## 2020-09-06 MED ORDER — POTASSIUM CHLORIDE CRYS ER 20 MEQ PO TBCR
40.0000 meq | EXTENDED_RELEASE_TABLET | Freq: Once | ORAL | Status: AC
  Administered 2020-09-06: 40 meq via ORAL
  Filled 2020-09-06: qty 2

## 2020-09-06 MED ORDER — ENOXAPARIN SODIUM 80 MG/0.8ML ~~LOC~~ SOLN
70.0000 mg | SUBCUTANEOUS | Status: DC
  Administered 2020-09-07 – 2020-09-09 (×3): 70 mg via SUBCUTANEOUS
  Filled 2020-09-06 (×3): qty 0.8

## 2020-09-06 NOTE — Plan of Care (Signed)

## 2020-09-06 NOTE — Progress Notes (Signed)
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 She was also found to be hypoglycemic. She did not respond to Narcan or glucose. She was intubated for airway protection and was sent to [facility] from [facility]. Currently her respiratory status is stable, status post extubation. Patient transferred to [facility] service on 09/05/20.  Hospital course remarkable for encephalopathy with slow improvement.  Assessment & Plan:   Active Problems:   Pressure injury of skin   Respiratory failure (HCC)   Acute encephalopathy   Acute metabolic encephalopathy: Thought to be secondary to hypoglycemia, polypharmacy. 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 MRI of the brain did not show any acute intracranial abnormalities. None also in CT.She was taking Klonopin, Flexeril, Lyrica which have are on hold. Resumed home Seroquel, Effexor and methadone.  Mildly elevated ammonia level. LTM EEG showed severe diffuse encephalopathy likely related to sedation, no epileptiform discharges. Her mental status has improved.  Follows commands but does not talk and only moans and she is alert and awake.  Acute hypoxic respiratory failure: She was intubated for airway protection. Hospital course remarkable for aspiration pneumonitis. Currently on room air.  Aspiration pneumonitis: Sputum culture positive for Serratia marcescens.On cefepime Continue current antibiotics.  UTI: Urine culture positive for Enterococcus faecalis. On ampicillin.  Acute on chronic diastolic congestive heart failure: Appeared volume overloaded on presentation. Takes Lasix at home. Continue  current Lasix regimen. Also taking lisinopril, Crestor and Aldactone at home.Continue IV lasix for now  Cirrhosis/NASH/hepatitis C: She says she has been treated for hepatitis C. 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 She has chronic anasarca/ascites. Follow-up ammonia level. Continue lactulose. liver enzymes are almost normal.  Diabetes type 2: On Victoza, Metformin at home. Continue sliding scale insulin here.  Pancytopenia: Likely associated with chronic liver disease/chronic medical problems. Also has thrombocytopenia in the setting of cirrhosis. Continue to monitor CBC.  Hypokalemia: Being supplemented with potassium.  Morbid obesity: BMI of 46.9  Debility/deconditioning:  PT/OT evaluation done and recommended HH      Nutrition Problem: Inadequate oral intake Etiology: acute illness      DVT prophylaxis:Lovenox Code Status: Full Family Communication: Grandfather present at the bedside Status is: Inpatient  Remains inpatient appropriate because:Inpatient level of care appropriate due to severity of illness   Dispo: The patient is from: Home              Anticipated d/c is to: Home              Anticipated d/c date is: 2 days              Patient currently is not medically stable to d/c. Needs further improvement in the mental status before discharge.    Consultants: PCCM  Procedures:Intubation  Antimicrobials:  Anti-infectives (From admission, onward)   Start     Dose/Rate Route Frequency Ordered Stop   09/03/20 1400  ceFEPIme (MAXIPIME) 2 g in sodium chloride 0.9 % 100 mL IVPB        2 g 200 mL/hr over 30 Minutes Intravenous Every 8 hours 09/03/20 1233 09/08/20 1400   09/03/20 1330  ampicillin (OMNIPEN) 2 g in sodium  chloride 0.9 % 100 mL IVPB        2 g 300 mL/hr over 20 Minutes Intravenous Every 4 hours 09/03/20 1233 09/06/20 1330   09/03/20 0930  piperacillin-tazobactam (ZOSYN) IVPB 3.375 g  Status:  Discontinued        3.375 g 12.5 mL/hr  over 240 Minutes Intravenous Every 8 hours 09/03/20 0844 09/03/20 1233   08/31/20 2015  ceFEPIme (MAXIPIME) 2 g in sodium chloride 0.9 % 100 mL IVPB  Status:  Discontinued        2 g 200 mL/hr over 30 Minutes Intravenous Every 8 hours 08/31/20 1924 09/02/20 0813   08/31/20 1930  linezolid (ZYVOX) IVPB 600 mg  Status:  Discontinued        600 mg 300 mL/hr over 60 Minutes Intravenous Every 12 hours 08/31/20 1922 09/01/20 0810   08/31/20 1830  ceFEPIme (MAXIPIME) 2 g in sodium chloride 0.9 % 100 mL IVPB  Status:  Discontinued        2 g 200 mL/hr over 30 Minutes Intravenous  Once 08/31/20 1817 08/31/20 1924      Subjective:  Patient seen and examined at the bedside this morning.  She looked  better than yesterday.  Swelling on bilateral lower extremities have improved since yesterday.  She is more alert and awake, follows commands but moans and does not talk.She ate her breakfast  Objective: Vitals:   09/05/20 1500 09/05/20 1508 09/05/20 2151 09/06/20 0523  BP: (!) 142/70 (!) 144/74 129/74 (!) 145/75  Pulse: (!) 116 98 96 97  Resp: 17 17 20 20   Temp: 98.5 F (36.9 C) 98.1 F (36.7 C) 98.4 F (36.9 C) 98.5 F (36.9 C)  TempSrc:   Oral Oral  SpO2: 98% 98% 96% 98%  Weight:      Height:        Intake/Output Summary (Last 24 hours) at 09/06/2020 0820 Last data filed at 09/06/2020 2979 Gross per 24 hour  Intake 726 ml  Output 1750 ml  Net -1024 ml   Filed Weights   09/02/20 0400 09/03/20 0500 09/04/20 0358  Weight: (!) 150.5 kg (!) 146.5 kg (!) 144.2 kg    Examination:  General exam: Appears calm and comfortable ,Not in distress, morbidly obese, morning HEENT:PERRL,Oral mucosa moist, Ear/Nose normal on gross exam Respiratory system: Bilateral equal air entry, normal vesicular breath sounds, no wheezes or crackles  Cardiovascular system: S1 & S2 heard, RRR. No JVD, murmurs, rubs, gallops or clicks. Gastrointestinal system: Abdomen is distended, soft and nontender. No  organomegaly or masses felt. Normal bowel sounds heard. Central nervous system: Alert and oriented. No focal neurological deficits. Extremities: Improving lower bilateral extremity edema, no clubbing ,no cyanosis. Skin: No rashes, lesions or ulcers,no icterus ,no pallor   Data Reviewed: I have personally reviewed following labs and imaging studies  CBC: Recent Labs  Lab 08/31/20 0921 08/31/20 1934 09/02/20 0345 09/02/20 0345 09/02/20 1848 09/03/20 0803 09/04/20 0110 09/05/20 0027 09/06/20 0337  WBC 9.8   < > 3.6*  --   --  2.9* 3.4* 3.8* 2.9*  NEUTROABS 8.2*  --   --   --   --   --   --   --   --   HGB 10.4*   < > 8.3*   < > 8.2* 8.6* 8.8* 9.1* 9.4*  HCT 32.4*   < > 25.5*   < > 24.0* 26.8* 27.1* 28.5* 29.4*  MCV 91.3   < > 92.7  --   --  90.8 91.6 92.2 92.5  PLT 137*   < > 40*  --   --  69* 70* 71* 73*   < > = values in this interval not displayed.   Basic Metabolic Panel: Recent Labs  Lab 09/02/20 0345 09/02/20 0345 09/02/20 1848 09/03/20 0803 09/04/20 0110 09/05/20 0027 09/06/20 0337  NA 138   < > 141 139 139 141 142  K 4.3   < > 4.0 4.3 3.7 3.3* 3.3*  CL 108  --   --  104 104 105 107  CO2 23  --   --  25 26 27 26   GLUCOSE 127*  --   --  187* 153* 121* 124*  BUN 10  --   --  14 12 11 9   CREATININE 0.57  --   --  0.68 0.56 0.57 0.56  CALCIUM 7.6*  --   --  8.1* 8.0* 8.0* 8.4*  MG 2.1  --   --   --  2.0  --  1.8  PHOS 2.8  --   --   --   --   --   --    < > = values in this interval not displayed.   GFR: Estimated Creatinine Clearance: 149.5 mL/min (by C-G formula based on SCr of 0.56 mg/dL). Liver Function Tests: Recent Labs  Lab 08/31/20 0921 09/01/20 0255 09/02/20 0345 09/04/20 0110  AST 30 82* 83* 46*  ALT 30 34 34 31  ALKPHOS 87 76 73 82  BILITOT 1.0 1.0 0.7 0.6  PROT 6.6 5.9* 5.8* 6.1*  ALBUMIN 2.6* 2.3* 2.2* 2.4*   No results for input(s): LIPASE, AMYLASE in the last 168 hours. Recent Labs  Lab 09/01/20 1151 09/03/20 0926 09/04/20 0110  09/05/20 1400 09/06/20 0337  AMMONIA 70* 48* 52* 54* 44*   Coagulation Profile: Recent Labs  Lab 09/01/20 0719 09/02/20 0656  INR 1.1 1.2   Cardiac Enzymes: No results for input(s): CKTOTAL, CKMB, CKMBINDEX, TROPONINI in the last 168 hours. BNP (last 3 results) No results for input(s): PROBNP in the last 8760 hours. HbA1C: No results for input(s): HGBA1C in the last 72 hours. CBG: Recent Labs  Lab 09/05/20 1614 09/05/20 1927 09/05/20 2347 09/06/20 0304 09/06/20 0737  GLUCAP 127* 155* 126* 114* 114*   Lipid Profile: No results for input(s): CHOL, HDL, LDLCALC, TRIG, CHOLHDL, LDLDIRECT in the last 72 hours. Thyroid Function Tests: No results for input(s): TSH, T4TOTAL, FREET4, T3FREE, THYROIDAB in the last 72 hours. Anemia Panel: No results for input(s): VITAMINB12, FOLATE, FERRITIN, TIBC, IRON, RETICCTPCT in the last 72 hours. Sepsis Labs: Recent Labs  Lab 08/31/20 0916 09/01/20 0255  LATICACIDVEN 1.1 2.5*    Recent Results (from the past 240 hour(s))  Urine culture     Status: Abnormal   Collection Time: 08/31/20  9:21 AM   Specimen: Urine, Clean Catch  Result Value Ref Range Status   Specimen Description   Final    URINE, CLEAN CATCH Performed at [facility], [address], [place], [place] [address]    Special Requests   Final    NONE Performed at [facility], [address] [place],  [address]    Culture >=100,000 COLONIES/mL ENTEROCOCCUS FAECALIS (A)  Final   Report Status 09/03/2020 FINAL  Final   Organism ID, Bacteria ENTEROCOCCUS FAECALIS (A)  Final      Susceptibility   Enterococcus faecalis - MIC*    AMPICILLIN <=2 SENSITIVE Sensitive     NITROFURANTOIN <=16 SENSITIVE Sensitive  VANCOMYCIN 1 SENSITIVE Sensitive     * >=100,000 COLONIES/mL ENTEROCOCCUS FAECALIS  Respiratory Panel by RT PCR (Flu A&B, Covid) - Nasopharyngeal Swab     Status: None   Collection Time: 08/31/20  9:34 AM   Specimen: Nasopharyngeal Swab  Result Value Ref  Range Status   SARS Coronavirus 2 by RT PCR NEGATIVE NEGATIVE Final    Comment: (NOTE) SARS-CoV-2 target nucleic acids are NOT DETECTED.  The SARS-CoV-2 RNA is generally detectable in upper respiratoy specimens during the acute phase of infection. The lowest concentration of SARS-CoV-2 viral copies this assay can detect is 131 copies/mL. A negative result does not preclude SARS-Cov-2 infection and should not be used as the sole basis for treatment or other patient management decisions. A negative result may occur with  improper specimen collection/handling, submission of specimen other than nasopharyngeal swab, presence of viral mutation(s) within the areas targeted by this assay, and inadequate number of viral copies (<131 copies/mL). A negative result must be combined with clinical observations, patient history, and epidemiological information. The expected result is Negative.  Fact Sheet for Patients:  PinkCheek.be  Fact Sheet for Healthcare Providers:  GravelBags.it  This test is no t yet approved or cleared by the [place] FDA and  has been authorized for detection and/or diagnosis of SARS-CoV-2 by FDA under an Emergency Use Authorization (EUA). This EUA will remain  in effect (meaning this test can be used) for the duration of the COVID-19 declaration under Section 564(b)(1) of the Act, 21 U.S.C. section 360bbb-3(b)(1), unless the authorization is terminated or revoked sooner.     Influenza A by PCR NEGATIVE NEGATIVE Final   Influenza B by PCR NEGATIVE NEGATIVE Final    Comment: (NOTE) The Xpert Xpress SARS-CoV-2/FLU/RSV assay is intended as an aid in  the diagnosis of influenza from Nasopharyngeal swab specimens and  should not be used as a sole basis for treatment. Nasal washings and  aspirates are unacceptable for Xpert Xpress SARS-CoV-2/FLU/RSV  testing.  Fact Sheet for  Patients: PinkCheek.be  Fact Sheet for Healthcare Providers: GravelBags.it  This test is not yet approved or cleared by the [place] FDA and  has been authorized for detection and/or diagnosis of SARS-CoV-2 by  FDA under an Emergency Use Authorization (EUA). This EUA will remain  in effect (meaning this test can be used) for the duration of the  Covid-19 declaration under Section 564(b)(1) of the Act, 21  U.S.C. section 360bbb-3(b)(1), unless the authorization is  terminated or revoked. Performed at [facility], [address] [place], [place] [address]   Culture, blood (routine x 2)     Status: None   Collection Time: 08/31/20 10:18 AM   Specimen: BLOOD  Result Value Ref Range Status   Specimen Description   Final    BLOOD BLOOD LEFT ARM Performed at [facility], [place] [address], [place], [place] [address]    Special Requests   Final    BOTTLES DRAWN AEROBIC AND ANAEROBIC Blood Culture adequate volume Performed at [facility], [place] [address] [place], [place] [address]    Culture   Final    NO GROWTH 5 DAYS Performed at [facility], [address] [place], [place] [address]    Report Status 09/05/2020 FINAL  Final  MRSA PCR Screening     Status: None   Collection Time: 08/31/20  6:27 PM   Specimen: Nasopharyngeal  Result Value Ref Range Status   MRSA by PCR NEGATIVE  NEGATIVE Final    Comment:        The GeneXpert MRSA Assay (FDA approved for NASAL specimens only), is one component of a comprehensive MRSA colonization surveillance program. It is not intended to diagnose MRSA infection nor to guide or monitor treatment for MRSA infections. Performed at [facility], [place] [address], [place], [place] [address]   Culture, respiratory     Status: None   Collection Time: 09/01/20 12:11 AM   Specimen: Tracheal Aspirate  Result Value Ref Range Status    Specimen Description TRACHEAL ASPIRATE  Final   Special Requests NONE  Final   Gram Stain   Final    FEW WBC PRESENT, PREDOMINANTLY PMN FEW GRAM NEGATIVE RODS RARE GRAM POSITIVE COCCI Performed at [facility], [place] [address] [place], [place] [address]    Culture MODERATE SERRATIA MARCESCENS  Final   Report Status 09/03/2020 FINAL  Final   Organism ID, Bacteria SERRATIA MARCESCENS  Final      Susceptibility   Serratia marcescens - MIC*    CEFAZOLIN >=64 RESISTANT Resistant     CEFEPIME <=0.12 SENSITIVE Sensitive     CEFTAZIDIME <=1 SENSITIVE Sensitive     CEFTRIAXONE <=0.25 SENSITIVE Sensitive     CIPROFLOXACIN <=0.25 SENSITIVE Sensitive     GENTAMICIN <=1 SENSITIVE Sensitive     TRIMETH/SULFA <=20 SENSITIVE Sensitive     * MODERATE SERRATIA MARCESCENS  Culture, blood (single)     Status: None   Collection Time: 09/01/20  2:55 AM   Specimen: BLOOD  Result Value Ref Range Status   Specimen Description BLOOD SITE NOT SPECIFIED  Final   Special Requests   Final    BOTTLES DRAWN AEROBIC AND ANAEROBIC Blood Culture adequate volume   Culture   Final    NO GROWTH 5 DAYS Performed at [facility], [address], [place], [place] [address]    Report Status 09/06/2020 FINAL  Final         Radiology Studies: No results found.      Scheduled Meds: . acetaminophen  650 mg Oral Once  . chlorhexidine  15 mL Mouth Rinse BID  . Chlorhexidine Gluconate Cloth  6 each Topical Daily  . docusate sodium  100 mg Oral Daily  . enoxaparin (LOVENOX) injection  65 mg Subcutaneous Q24H  . feeding supplement  237 mL Oral BID BM  . furosemide  40 mg Intravenous BID  . insulin aspart  0-20 Units Subcutaneous Q4H  . lactulose  10 g Oral TID  . mouth rinse  15 mL Mouth Rinse q12n4p  . methadone  30 mg Oral Daily  . pantoprazole sodium  40 mg Oral Q24H  . polyethylene glycol  17 g Oral Daily  . potassium chloride  40 mEq Oral Once  . QUEtiapine  25 mg Oral QHS  . venlafaxine  XR  225 mg Oral Q1200   Continuous Infusions: . sodium chloride 10 mL/hr at 09/04/20 2125  . ampicillin (OMNIPEN) IV 2 g (09/06/20 0334)  . ceFEPime (MAXIPIME) IV 2 g (09/06/20 0538)     LOS: 6 days    Time spent: 35 mins.More than 50% of that time was spent in counseling and/or coordination of care.      Shelly Coss, MD [facility] P11/03/2020, 8:20 AM

## 2020-09-06 NOTE — Evaluation (Signed)
Occupational Therapy Evaluation Patient Details Name: Jaime [name] MRN: [id number] DOB: 30-Nov-1985 Today's Date: 09/06/2020    History of Present Illness (P) 34 yo female admitted to ED on 10/30 for unresponsiveness, CBG 30. ETT 10/30-11/1, medical workup for acute metabolic vs toxic encephalopathy due to hypoglycemia, polypharmacy. Recent admission for sepsis of unknown PMH includes Hep C s/p treatment, NAFLD/NASH, Cirrhosis, IV drug abuse (abstained for 20yr), DM, MO, chronic dyspnea, HFpEF.   Clinical Impression   PTA patient independent and driving per grandfather.  Admitted for above and limited by problem list below, including generalized weakness, decreased activity tolerance, impaired balance, and impaired cognition/communication. Patient only moaning during session, shakes head yes; responds to all questions today by shaking head yes (even when questioned on month of birthday-- July or may, and question place--home or hotel).  She follows simple commands with increased time, but poor problem solving and awareness, soils herself in urine sitting in recliner and requires 1 step cueing to don socks.  Patient requires min assist for transfers at this time.  Grandfather at side and supportive during session.  She will requires 24/7 support at discharge, [facility]. Will follow acutely.     Follow Up Recommendations  Home health OT;Supervision/Assistance - 24 hour    Equipment Recommendations  3 in 1 bedside commode    Recommendations for Other Services Speech consult;Other (comment) (psych consult)     Precautions / Restrictions Precautions Precautions: (P) Fall Restrictions Weight Bearing Restrictions: (P) No      Mobility Bed Mobility               General bed mobility comments: OOB in recliner upon entry     Transfers Overall transfer level: Needs assistance   Transfers:  Sit to/from Stand Sit to Stand: Min assist         General transfer comment: min assist to power up and steady from recliner     Balance Overall balance assessment: Needs assistance Sitting-balance support: No upper extremity supported;Feet supported Sitting balance-Leahy Scale: Fair     Standing balance support: Single extremity supported;During functional activity Standing balance-Leahy Scale: Poor Standing balance comment: relies on external support                            ADL either performed or assessed with clinical judgement   ADL Overall ADL's : Needs assistance/impaired     Grooming: Moderate assistance;Sitting   Upper Body Bathing: Moderate assistance;Sitting   Lower Body Bathing: Maximal assistance;Sit to/from stand   Upper Body Dressing : Moderate assistance;Sitting   Lower Body Dressing: Maximal assistance;Sit to/from stand Lower Body Dressing Details (indicate cue type and reason): attempting to don/doff socks, requires max assist to don; min assist sit to stand  Toilet Transfer: Minimal assistance;Stand-pivot Toilet Transfer Details (indicate cue type and reason): simulated to EOB          Functional mobility during ADLs: Minimal assistance;Cueing for safety;Cueing for sequencing General ADL Comments: pt limited by cognition, weakness, impaired balance      Vision   Additional [facility]     Perception     Praxis      Pertinent Vitals/Pain Pain Assessment: (P) Faces Faces Pain Scale: (P) Hurts a little bit Pain Location: (P) abdomen Pain Descriptors / Indicators: Sore;Discomfort Pain Intervention(s): (P) Monitored during session;Repositioned     Hand  Dominance Right   Extremity/Trunk Assessment Upper Extremity Assessment Upper Extremity Assessment: Generalized weakness   Lower Extremity Assessment Lower Extremity Assessment: Defer to PT evaluation   Cervical / Trunk Assessment Cervical / Trunk Assessment:  Other exceptions Cervical / Trunk Exceptions: increased body habitus   Communication Communication Communication: Receptive difficulties;Expressive difficulties (pt moaning only)   Cognition Arousal/Alertness: (P) Awake/alert Behavior During Therapy: (P) Restless;Impulsive;Flat affect Overall Cognitive Status: (P) Difficult to assess Area of Impairment: (P) Orientation;Attention;Safety/judgement;Awareness;Problem solving                 Orientation Level: Disoriented to;Place;Time;Situation     Following Commands: Follows one step commands inconsistently;Follows one step commands with increased time   Awareness: Intellectual Problem Solving: Slow processing;Decreased initiation;Difficulty sequencing;Requires verbal cues;Requires tactile cues General Comments: pt shaking head yes to most questions, reports yes to her birthday in July and May; as well as location home and hospital therefore unsure of accuracy of reports.  Patient following 1 step commands inconsistently, requires cueing for problem solving through simple self care tasks, and incontinent of bladder during session    General Comments  HR ranged from 110s-125 during minimal ADL tasks     Exercises     Shoulder Instructions      Home Living Family/patient expects to be discharged to:: Private residence Living Arrangements: Parent Available Help at Discharge: Family;Available 24 hours/day Type of Home: House Home Access: Stairs to enter [employer] of Steps: 2 Entrance Stairs-Rails: Right;Left;Can reach both Home Layout: One level     Bathroom Shower/Tub: Teacher, early years/pre: Standard Bathroom Accessibility: Yes   Home Equipment: Environmental consultant - 2 wheels;Cane - single point   Additional Comments: per grandfather       Prior Functioning/Environment Level of Independence: Independent        Comments: reports independent and driving- per grandfather         OT Problem List:  Decreased strength;Decreased activity tolerance;Impaired balance (sitting and/or standing);Decreased coordination;Decreased cognition;Decreased safety awareness;Decreased knowledge of use of DME or AE;Decreased knowledge of precautions;Cardiopulmonary status limiting activity;Pain;Obesity      OT Treatment/Interventions: Self-care/ADL training;Therapeutic exercise;DME and/or AE instruction;Therapeutic activities;Cognitive remediation/compensation;Patient/family education;Balance training    OT Goals(Current goals can be found in the care plan section) Acute Rehab OT Goals Patient Stated Goal: (P) nods yes to walking OT Goal Formulation: With family Time For Goal Achievement: 09/20/20 Potential to Achieve Goals: Fair  OT Frequency: Min 3X/week   Barriers to D/C:            Co-evaluation              AM-PAC OT "6 Clicks" Daily Activity     Outcome Measure Help from another person eating meals?: A Lot Help from another person taking care of personal grooming?: A Lot Help from another person toileting, which includes using toliet, bedpan, or urinal?: Total Help from another person bathing (including washing, rinsing, drying)?: A Lot Help from another person to put on and taking off regular upper body clothing?: A Lot Help from another person to put on and taking off regular lower body clothing?: A Lot 6 Click Score: 11   End of Session Nurse Communication: Mobility status;Other (comment) (cogition eval )  Activity Tolerance: Patient tolerated treatment well Patient left: with call bell/phone within reach;Other (comment) (seated EOB with PT )  OT Visit Diagnosis: Other abnormalities of gait and mobility (R26.89);Muscle weakness (generalized) (M62.81);Other symptoms and signs involving cognitive function;Cognitive communication deficit (R41.841);Pain Pain - part of  body:  (abdomen)                Time: 7846-9629 OT Time Calculation (min): 26 min Charges:  OT General Charges $OT  Visit: 1 Visit OT Evaluation $OT Eval Moderate Complexity: 1 Mod OT Treatments $Self Care/Home Management : 8-22 mins  Jolaine Artist, OT Acute Rehabilitation Services Pager [phone] Office [phone]   Delight Stare 09/06/2020, 1:13 PM

## 2020-09-06 NOTE — Progress Notes (Signed)
Physical Therapy Treatment Patient Details Name: Jaime [name] MRN: [id number] DOB: Nov 24, 1985 Today's Date: 09/06/2020    History of Present Illness 34 yo female admitted to ED on 10/30 for unresponsiveness, CBG 30. ETT 10/30-11/1, medical workup for acute metabolic vs toxic encephalopathy due to hypoglycemia, polypharmacy. Recent admission for sepsis of unknown PMH includes Hep C s/p treatment, NAFLD/NASH, Cirrhosis, IV drug abuse (abstained for 31yr), DM, MO, chronic dyspnea, HFpEF.    PT Comments    Pt was seen for walk in room and trip to BR, with mult false starts to being done and returning to chair.  Her efforts with there ex were fully prompted as were all directional uses of walker.  Pt is willing to work but her confusion is making her a safety risk.  Will still recommend HHPT but the 24/7 support is a must for avoidance of injury.     Follow Up Recommendations  Home health PT;Supervision/Assistance - 24 hour     Equipment Recommendations  None recommended by PT    Recommendations for Other Services       Precautions / Restrictions Precautions Precautions: Fall Restrictions Weight Bearing Restrictions: No    Mobility  Bed Mobility               General bed mobility comments: in chair when PT arrived  Transfers Overall transfer level: Needs assistance Equipment used: Rolling walker (2 wheeled) Transfers: Sit to/from Stand Sit to Stand: Min assist         General transfer comment: min with hand placement cues  Ambulation/Gait Ambulation/Gait assistance: Min assist Gait Distance (Feet): 30 Feet Assistive device: Rolling walker (2 wheeled) Gait Pattern/deviations: Step-through pattern;Decreased stride length;Wide base of support;Trunk flexed Gait velocity: decr Gait velocity interpretation: <1.31 ft/sec, indicative of household ambulator General Gait Details: pt requires cues for mangement of walker in confined spaces   Stairs              Wheelchair Mobility    Modified Rankin (Stroke Patients Only)       Balance Overall balance assessment: Needs assistance Sitting-balance support: Feet supported Sitting balance-Leahy Scale: Fair     Standing balance support: Single extremity supported Standing balance-Leahy Scale: Poor Standing balance comment: requires wall bar or walker                            Cognition Arousal/Alertness: Awake/alert Behavior During Therapy: Restless;Impulsive;Flat affect Overall Cognitive Status: Difficult to assess Area of Impairment: Orientation;Attention;Safety/judgement;Awareness;Problem solving                 Orientation Level: Place;Time;Situation Current Attention Level: Selective Memory: Decreased short-term memory;Decreased recall of precautions Following Commands: Follows one step commands inconsistently;Follows one step commands with increased time Safety/Judgement: Decreased awareness of deficits Awareness: Intellectual Problem Solving: Slow processing;Requires verbal cues;Requires tactile cues;Decreased initiation;Difficulty sequencing General Comments: Pt is having trouble determining she is finished on commode, stood up three times before getting done      Exercises General Exercises - Lower Extremity Ankle Circles/Pumps: AAROM;5 reps Quad Sets: AROM;10 reps Heel Slides: AAROM;10 reps Hip ABduction/ADduction: AAROM;10 reps    General Comments General comments (skin integrity, edema, etc.): pt is unable to decide if she is done with commode, stood up several times and immediately resat due to being unfinished      Pertinent Vitals/Pain Pain Assessment: Faces Faces Pain Scale: Hurts a little bit Pain Location: abdomen Pain Descriptors / Indicators: Sore;Discomfort Pain Intervention(s):  Monitored during session;Repositioned    Home Living Family/patient expects to be discharged to:: Private residence Living Arrangements:  Parent Available Help at Discharge: Family;Available 24 hours/day Type of Home: House Home Access: Stairs to enter Entrance Stairs-Rails: Right;Left;Can reach both Home Layout: One level Home Equipment: Environmental consultant - 2 wheels;Cane - single point Additional Comments: per grandfather     Prior Function Level of Independence: Independent      Comments: reports independent and driving- per grandfather    PT Goals (current goals can now be found in the care plan section) Acute Rehab PT Goals Patient Stated Goal: nods yes to walking Progress towards PT goals: Progressing toward goals    Frequency    Min 3X/week      PT Plan Current plan remains appropriate    Co-evaluation              AM-PAC PT "6 Clicks" Mobility   Outcome Measure  Help needed turning from your back to your side while in a flat bed without using bedrails?: A Little Help needed moving from lying on your back to sitting on the side of a flat bed without using bedrails?: A Lot Help needed moving to and from a bed to a chair (including a wheelchair)?: A Little Help needed standing up from a chair using your arms (e.g., wheelchair or bedside chair)?: A Little Help needed to walk in hospital room?: A Little Help needed climbing 3-5 steps with a railing? : A Lot 6 Click Score: 16    End of Session Equipment Utilized During Treatment: Gait belt Activity Tolerance: Patient limited by fatigue;Treatment limited secondary to medical complications (Comment) Patient left: in chair;with call bell/phone within reach;with chair alarm set;with family/visitor present Nurse Communication: Mobility status PT Visit Diagnosis: Unsteadiness on feet (R26.81);Other abnormalities of gait and mobility (R26.89);Muscle weakness (generalized) (M62.81)     Time: 0488-8916 PT Time Calculation (min) (ACUTE ONLY): 37 min  Charges:  $Gait Training: 8-22 mins $Therapeutic Exercise: 8-22 mins                   Ramond Dial 09/06/2020,  1:18 PM  Mee Hives, PT MS Acute Rehab Dept. Number: [place] and [place]

## 2020-09-07 DIAGNOSIS — F4321 Adjustment disorder with depressed mood: Secondary | ICD-10-CM

## 2020-09-07 HISTORY — DX: Adjustment disorder with depressed mood: F43.21

## 2020-09-07 LAB — BASIC METABOLIC PANEL
Anion gap: 10 (ref 5–15)
BUN: 11 mg/dL (ref 6–20)
CO2: 26 mmol/L (ref 22–32)
Calcium: 8.6 mg/dL — ABNORMAL LOW (ref 8.9–10.3)
Chloride: 106 mmol/L (ref 98–111)
Creatinine, Ser: 0.58 mg/dL (ref 0.44–1.00)
GFR, Estimated: >60 mL/min (ref >60)
Glucose, Bld: 128 mg/dL — ABNORMAL HIGH (ref 70–99)
Potassium: 3.4 mmol/L — ABNORMAL LOW (ref 3.5–5.1)
Sodium: 142 mmol/L (ref 135–145)

## 2020-09-07 LAB — CBC
HCT: 29 % — ABNORMAL LOW (ref 36.0–46.0)
Hemoglobin: 9.2 g/dL — ABNORMAL LOW (ref 12.0–15.0)
MCH: 29.6 pg (ref 26.0–34.0)
MCHC: 31.7 g/dL (ref 30.0–36.0)
MCV: 93.2 fL (ref 80.0–100.0)
Platelets: 74 10*3/uL — ABNORMAL LOW (ref 150–400)
RBC: 3.11 MIL/uL — ABNORMAL LOW (ref 3.87–5.11)
RDW: 17.4 % — ABNORMAL HIGH (ref 11.5–15.5)
WBC: 2.7 10*3/uL — ABNORMAL LOW (ref 4.0–10.5)
nRBC: 0 % (ref 0.0–0.2)

## 2020-09-07 LAB — GLUCOSE, CAPILLARY
Glucose-Capillary: 125 mg/dL — ABNORMAL HIGH (ref 70–99)
Glucose-Capillary: 114 mg/dL — ABNORMAL HIGH (ref 70–99)
Glucose-Capillary: 153 mg/dL — ABNORMAL HIGH (ref 70–99)
Glucose-Capillary: 271 mg/dL — ABNORMAL HIGH (ref 70–99)
Glucose-Capillary: 189 mg/dL — ABNORMAL HIGH (ref 70–99)
Glucose-Capillary: 130 mg/dL — ABNORMAL HIGH (ref 70–99)

## 2020-09-07 MED ORDER — POTASSIUM CHLORIDE CRYS ER 20 MEQ PO TBCR
40.0000 meq | EXTENDED_RELEASE_TABLET | Freq: Once | ORAL | Status: AC
  Administered 2020-09-07: 40 meq via ORAL
  Filled 2020-09-07: qty 2

## 2020-09-07 NOTE — Evaluation (Signed)
Speech Language Pathology Evaluation Patient Details Name: Jaime [name] MRN: [id number] DOB: Feb 15, 1986 Today's Date: 09/07/2020 Time: 0347-4259 SLP Time Calculation (min) (ACUTE ONLY): 15 min  Problem List:  Patient Active Problem List   Diagnosis Date Noted  . Respiratory failure ([place]) 08/31/2020  . Acute encephalopathy 08/31/2020  . Hyponatremia 08/16/2020  . Volume overload 08/14/2020  . Dyspnea 07/29/2020  . Nexplanon insertion 05/20/2020  . Nexplanon removal 05/20/2020  . Pressure injury of skin 01/29/2019  . Uncontrolled diabetes mellitus ([place]) 09/19/2018  . Hyperglycemia 09/17/2018  . Acute respiratory failure with hypoxia ([place]) 09/15/2018  . Anxiety 09/15/2018  . Chronic abdominal pain 09/15/2018  . Diabetes mellitus without complication ([place]) 56/38/7564  . Hepatitis C 09/15/2018  . Peptic ulcer 09/15/2018  . Long-term current use of methadone for opiate dependence ([place]) 09/15/2018  . Cirrhosis ([place]) 09/15/2018  . Acute hypoxemic respiratory failure ([place]) 07/17/2016  . Pleural effusion on right 07/17/2016  . Multiple rib fractures involving four or more ribs 07/17/2016  . Hemothorax on right 07/17/2016  . Chronic pain disorder 07/17/2016   Past Medical History:  Past Medical History:  Diagnosis Date  . Anxiety   . Asthma   . Chronic abdominal pain   . Chronic back pain   . Cirrhosis ([place])   . Cirrhosis ([place])   . COPD (chronic obstructive pulmonary disease) ([place])   . Depression   . Diabetes mellitus without complication ([place])   . Diabetes mellitus, type II ([place])   . Hepatitis C   . Insomnia   . Long-term current use of methadone for opiate dependence ([place])   . Lupus ([place])   . Migraine headache   . Neuropathy   . Nocturnal seizures ([place])   . Peptic ulcer   . Spleen enlarged    Past Surgical History:  Past Surgical History:  Procedure Laterality Date  . CHOLECYSTECTOMY     HPI:  Patient is a 34 year old female with past medical history of  polysubstance abuse, 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 She was also found to be hypoglycemic. She did not respond to Narcan or glucose. She was intubated for airway protection 10/30-11/1.  Hospital course remarkable for encephalopathy with slow improvement.   Assessment / Plan / Recommendation Clinical Impression  Pt presents with presumed encephalopathic speech-language deficits with mixed expressive greater than receptive impairment.  Pt assessed informally d/t severity of deficits. Pt was able to vocalize, but did not speak at any point in time during today's evaluation.  Pt was unable to complete automatic speech tasks (counting, name) with maximum cuing (carrier phrase, choral recitiation, tactile cuing, visual cuing and modeling). With singing, pt did hum, but made no articulatory gestures.  Pt's receptive language is relatively spared, but pt does exhibit deficits.  Pt followed 1 step command with 100% accuracy, but only completed first step of 2 step commands.  Pt was unable to reliably identify objects from field of 2.  With yes/no questions, pt performed slightly above chance and demonstrates a yes bias.  Pt demonstrated ability to communicate basic needs with paired yes-no questions and additional probing.  MD in room prior to today's language evaluation. There is no organic cause for speech difficulties (MRI negative, no seizure findings), and it is presumed to be a combination of medications and high ammonia levels contributing to encephalopathy.  Hopefully pt's language deficits are reversible.  SLP will follow in house to address  above noted deficits and reassess as needed.  Pt's ST needs at discharge TBD.    SLP Assessment  SLP Recommendation/Assessment: Patient needs continued Speech Lanaguage Pathology Services SLP Visit Diagnosis: Aphasia (R47.01)    Follow Up Recommendations   (TBD)     Frequency and Duration min 2x/week  2 weeks      SLP Evaluation Cognition  Overall Cognitive Status: Difficult to assess (suspect impairment/change from baseline)       Comprehension  Auditory Comprehension Overall Auditory Comprehension: Impaired Yes/No Questions: Impaired Basic Biographical Questions: 26-50% accurate Basic Immediate Environment Questions: 25-49% accurate Complex Questions: 25-49% accurate Commands: Impaired One Step Basic Commands: 75-100% accurate Two Step Basic Commands: 50-74% accurate Visual Recognition/Discrimination Discrimination: Exceptions to [facility] Common Objects: Unable to indentify    Expression Expression Primary Mode of Expression: Verbal Verbal Expression Overall Verbal Expression: Impaired Initiation: Impaired Automatic Speech: Name;Social Response;Counting;Singing (Unable) Repetition: Impaired Level of Impairment: Word level Naming: Impairment Confrontation: Impaired Common Objects: Unable to indentify   Oral / Motor  Oral Motor/Sensory Function Overall Oral Motor/Sensory Function: Mild impairment Facial ROM: Within Functional Limits Facial Symmetry: Within Functional Limits Lingual ROM: Within Functional Limits Lingual Symmetry: Within Functional Limits Lingual Strength: Reduced Velum:  (unable to assess) Mandible: Impaired (reduced excursion)   GO                    Celedonio Savage, MA, [place] Office: [phone]; Pager (11/6): [phone] 09/07/2020, 10:15 AM

## 2020-09-07 NOTE — Plan of Care (Signed)
  Problem: Education: Goal: Knowledge of General Education information will improve Description: Including pain rating scale, medication(s)/side effects and non-pharmacologic comfort measures Outcome: Not Applicable   Problem: Health Behavior/Discharge Planning: Goal: Ability to manage health-related needs will improve Outcome: Not Applicable   Problem: Clinical Measurements: Goal: Ability to maintain clinical measurements within normal limits will improve Outcome: Not Applicable Goal: Will remain free from infection Outcome: Not Applicable Goal: Diagnostic test results will improve Outcome: Not Applicable Goal: Respiratory complications will improve Outcome: Not Applicable Goal: Cardiovascular complication will be avoided Outcome: Not Applicable   Problem: Activity: Goal: Risk for activity intolerance will decrease Outcome: Not Applicable   Problem: Nutrition: Goal: Adequate nutrition will be maintained Outcome: Not Applicable   Problem: Coping: Goal: Level of anxiety will decrease Outcome: Not Applicable   Problem: Elimination: Goal: Will not experience complications related to bowel motility Outcome: Not Applicable Goal: Will not experience complications related to urinary retention Outcome: Not Applicable   Problem: Pain Managment: Goal: General experience of comfort will improve Outcome: Not Applicable   Problem: Safety: Goal: Ability to remain free from injury will improve Outcome: Not Applicable   Problem: Skin Integrity: Goal: Risk for impaired skin integrity will decrease Outcome: Not Applicable

## 2020-09-07 NOTE — Progress Notes (Signed)
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 She was also found to be hypoglycemic. She did not respond to Narcan or glucose. She was intubated for airway protection and was sent to [facility] from [place]. Currently her respiratory status is stable, status post extubation. Patient transferred to [facility] service on 09/05/20.  Hospital course remarkable for encephalopathy with slow improvement.  Psychiatry consulted today.  Assessment & Plan:   Active Problems:   Pressure injury of skin   Respiratory failure (HCC)   Acute encephalopathy   Acute metabolic encephalopathy: Thought to be secondary to hypoglycemia, polypharmacy. 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 MRI of the brain did not show any acute intracranial abnormalities. None also in CT.She was taking Klonopin, Flexeril, Lyrica which have are on hold. Resumed home Seroquel, Effexor and methadone.  Mildly elevated ammonia level. LTM EEG showed severe diffuse encephalopathy likely related to sedation, no epileptiform discharges. Her mental status has improved.  Follows commands but does not talk and she is alert and awake and follows commands. We suspect underlying psychiatric abnormality.  We have requested psychiatry consultation.  Acute hypoxic respiratory failure: She was intubated for airway protection. Hospital course remarkable for aspiration pneumonitis. Currently on room air.  Aspiration pneumonitis: Sputum culture positive for Serratia marcescens.Treated with cefipime  UTI: Urine culture positive for Enterococcus faecalis. Treated with  ampicillin.  Acute on  chronic diastolic congestive heart failure: Appeared volume overloaded on presentation. Takes Lasix at home. Continue current Lasix regimen. Also taking lisinopril, Crestor and Aldactone at home.Continue IV lasix for now  Cirrhosis/NASH/hepatitis C: She says she has been treated for hepatitis C. 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 She has chronic anasarca/ascites. Follow-up ammonia level. Continue lactulose. liver enzymes are almost normal.  Anasarca improving with IV Lasix  Diabetes type 2: On Victoza, Metformin at home. Continue sliding scale insulin here.  Pancytopenia: Likely associated with chronic liver disease/chronic medical problems. Also has thrombocytopenia in the setting of cirrhosis. Continue to monitor CBC.  Hypokalemia: Being monitored and supplemented   Morbid obesity: BMI of 46.9  Debility/deconditioning:  PT/OT evaluation done and recommended HH      Nutrition Problem: Inadequate oral intake Etiology: acute illness      DVT prophylaxis:Lovenox Code Status: Full Family Communication: Mother present at the bedside Status is: Inpatient  Remains inpatient appropriate because:Inpatient level of care appropriate due to severity of illness   Dispo: The patient is from: Home              Anticipated d/c is to: Home              Anticipated d/c date is: 1-2 days              Patient currently is not medically stable to d/c. Needs further improvement in the mental status before discharge.    Consultants: PCCM  Procedures:Intubation  Antimicrobials:  Anti-infectives (From admission, onward)   Start     Dose/Rate Route Frequency Ordered Stop   09/03/20 1400  ceFEPIme (MAXIPIME) 2 g in sodium chloride 0.9 % 100 mL IVPB        2 g 200 mL/hr  over 30 Minutes Intravenous Every 8 hours 09/03/20 1233 09/08/20 1400   09/03/20 1330  ampicillin (OMNIPEN) 2 g in sodium chloride 0.9 % 100 mL IVPB        2 g 300 mL/hr over 20 Minutes Intravenous Every 4  hours 09/03/20 1233 09/06/20 1240   09/03/20 0930  piperacillin-tazobactam (ZOSYN) IVPB 3.375 g  Status:  Discontinued        3.375 g 12.5 mL/hr over 240 Minutes Intravenous Every 8 hours 09/03/20 0844 09/03/20 1233   08/31/20 2015  ceFEPIme (MAXIPIME) 2 g in sodium chloride 0.9 % 100 mL IVPB  Status:  Discontinued        2 g 200 mL/hr over 30 Minutes Intravenous Every 8 hours 08/31/20 1924 09/02/20 0813   08/31/20 1930  linezolid (ZYVOX) IVPB 600 mg  Status:  Discontinued        600 mg 300 mL/hr over 60 Minutes Intravenous Every 12 hours 08/31/20 1922 09/01/20 0810   08/31/20 1830  ceFEPIme (MAXIPIME) 2 g in sodium chloride 0.9 % 100 mL IVPB  Status:  Discontinued        2 g 200 mL/hr over 30 Minutes Intravenous  Once 08/31/20 1817 08/31/20 1924      Subjective:  Patient seen and examined at the bedside this morning.  Comfortable.  She looks much better today.  Anasarca has improved.  This morning has a stopped.  She is completely alert and awake and follows commands but does not speak. Objective: Vitals:   09/06/20 0523 09/06/20 1444 09/06/20 2133 09/07/20 0436  BP: (!) 145/75 (!) 114/99 (!) 159/87 139/80  Pulse: 97 (!) 123 (!) 108 (!) 106  Resp: 20 17 18 20   Temp: 98.5 F (36.9 C) 99.5 F (37.5 C) 99.3 F (37.4 C) 98.3 F (36.8 C)  TempSrc: Oral  Oral Oral  SpO2: 98% 92% 95% 95%  Weight:      Height:       No intake or output data in the 24 hours ending 09/07/20 0823 Filed Weights   09/02/20 0400 09/03/20 0500 09/04/20 0358  Weight: (!) 150.5 kg (!) 146.5 kg (!) 144.2 kg    Examination:  General exam: Appears calm and comfortable ,Not in distress, morbidly obese HEENT:PERRL,Oral mucosa moist, Ear/Nose normal on gross exam Respiratory system: Bilateral equal air entry, normal vesicular breath sounds, no wheezes or crackles  Cardiovascular system: S1 & S2 heard, RRR. No JVD, murmurs, rubs, gallops or clicks. Gastrointestinal system: Abdomen is distended, soft and  nontender. No organomegaly or masses felt. Normal bowel sounds heard. Central nervous system: Alert and oriented. No focal neurological deficits. Extremities: Improving lower bilateral extremity edema, no clubbing ,no cyanosis. Skin: No rashes, lesions or ulcers,no icterus ,no pallor   Data Reviewed: I have personally reviewed following labs and imaging studies  CBC: Recent Labs  Lab 08/31/20 0921 08/31/20 1934 09/03/20 0803 09/04/20 0110 09/05/20 0027 09/06/20 0337 09/07/20 0450  WBC 9.8   < > 2.9* 3.4* 3.8* 2.9* 2.7*  NEUTROABS 8.2*  --   --   --   --   --   --   HGB 10.4*   < > 8.6* 8.8* 9.1* 9.4* 9.2*  HCT 32.4*   < > 26.8* 27.1* 28.5* 29.4* 29.0*  MCV 91.3   < > 90.8 91.6 92.2 92.5 93.2  PLT 137*   < > 69* 70* 71* 73* 74*   < > = values in this interval not displayed.   Basic Metabolic Panel: Recent  Labs  Lab 09/02/20 0345 09/02/20 1848 09/03/20 0803 09/04/20 0110 09/05/20 0027 09/06/20 0337 09/07/20 0450  NA 138   < > 139 139 141 142 142  K 4.3   < > 4.3 3.7 3.3* 3.3* 3.4*  CL 108  --  104 104 105 107 106  CO2 23  --  25 26 27 26 26   GLUCOSE 127*  --  187* 153* 121* 124* 128*  BUN 10  --  14 12 11 9 11   CREATININE 0.57  --  0.68 0.56 0.57 0.56 0.58  CALCIUM 7.6*  --  8.1* 8.0* 8.0* 8.4* 8.6*  MG 2.1  --   --  2.0  --  1.8  --   PHOS 2.8  --   --   --   --   --   --    < > = values in this interval not displayed.   GFR: Estimated Creatinine Clearance: 149.5 mL/min (by C-G formula based on SCr of 0.58 mg/dL). Liver Function Tests: Recent Labs  Lab 08/31/20 0921 09/01/20 0255 09/02/20 0345 09/04/20 0110  AST 30 82* 83* 46*  ALT 30 34 34 31  ALKPHOS 87 76 73 82  BILITOT 1.0 1.0 0.7 0.6  PROT 6.6 5.9* 5.8* 6.1*  ALBUMIN 2.6* 2.3* 2.2* 2.4*   No results for input(s): LIPASE, AMYLASE in the last 168 hours. Recent Labs  Lab 09/01/20 1151 09/03/20 0926 09/04/20 0110 09/05/20 1400 09/06/20 0337  AMMONIA 70* 48* 52* 54* 44*   Coagulation  Profile: Recent Labs  Lab 09/01/20 0719 09/02/20 0656  INR 1.1 1.2   Cardiac Enzymes: No results for input(s): CKTOTAL, CKMB, CKMBINDEX, TROPONINI in the last 168 hours. BNP (last 3 results) No results for input(s): PROBNP in the last 8760 hours. HbA1C: No results for input(s): HGBA1C in the last 72 hours. CBG: Recent Labs  Lab 09/06/20 1606 09/06/20 1920 09/06/20 2319 09/07/20 0314 09/07/20 0757  GLUCAP 195* 163* 133* 125* 114*   Lipid Profile: No results for input(s): CHOL, HDL, LDLCALC, TRIG, CHOLHDL, LDLDIRECT in the last 72 hours. Thyroid Function Tests: No results for input(s): TSH, T4TOTAL, FREET4, T3FREE, THYROIDAB in the last 72 hours. Anemia Panel: No results for input(s): VITAMINB12, FOLATE, FERRITIN, TIBC, IRON, RETICCTPCT in the last 72 hours. Sepsis Labs: Recent Labs  Lab 08/31/20 0916 09/01/20 0255  LATICACIDVEN 1.1 2.5*    Recent Results (from the past 240 hour(s))  Urine culture     Status: Abnormal   Collection Time: 08/31/20  9:21 AM   Specimen: Urine, Clean Catch  Result Value Ref Range Status   Specimen Description   Final    URINE, CLEAN CATCH Performed at [facility], [address], [place], [place] [address]    Special Requests   Final    NONE Performed at [facility], [address] [place],  [address]    Culture >=100,000 COLONIES/mL ENTEROCOCCUS FAECALIS (A)  Final   Report Status 09/03/2020 FINAL  Final   Organism ID, Bacteria ENTEROCOCCUS FAECALIS (A)  Final      Susceptibility   Enterococcus faecalis - MIC*    AMPICILLIN <=2 SENSITIVE Sensitive     NITROFURANTOIN <=16 SENSITIVE Sensitive     VANCOMYCIN 1 SENSITIVE Sensitive     * >=100,000 COLONIES/mL ENTEROCOCCUS FAECALIS  Respiratory Panel by RT PCR (Flu A&B, Covid) - Nasopharyngeal Swab     Status: None   Collection Time: 08/31/20  9:34 AM   Specimen: Nasopharyngeal Swab  Result Value Ref  Range Status   SARS Coronavirus 2 by RT PCR NEGATIVE NEGATIVE Final     Comment: (NOTE) SARS-CoV-2 target nucleic acids are NOT DETECTED.  The SARS-CoV-2 RNA is generally detectable in upper respiratoy specimens during the acute phase of infection. The lowest concentration of SARS-CoV-2 viral copies this assay can detect is 131 copies/mL. A negative result does not preclude SARS-Cov-2 infection and should not be used as the sole basis for treatment or other patient management decisions. A negative result may occur with  improper specimen collection/handling, submission of specimen other than nasopharyngeal swab, presence of viral mutation(s) within the areas targeted by this assay, and inadequate number of viral copies (<131 copies/mL). A negative result must be combined with clinical observations, patient history, and epidemiological information. The expected result is Negative.  Fact Sheet for Patients:  PinkCheek.be  Fact Sheet for Healthcare Providers:  GravelBags.it  This test is no t yet approved or cleared by the [place] FDA and  has been authorized for detection and/or diagnosis of SARS-CoV-2 by FDA under an Emergency Use Authorization (EUA). This EUA will remain  in effect (meaning this test can be used) for the duration of the COVID-19 declaration under Section 564(b)(1) of the Act, 21 U.S.C. section 360bbb-3(b)(1), unless the authorization is terminated or revoked sooner.     Influenza A by PCR NEGATIVE NEGATIVE Final   Influenza B by PCR NEGATIVE NEGATIVE Final    Comment: (NOTE) The Xpert Xpress SARS-CoV-2/FLU/RSV assay is intended as an aid in  the diagnosis of influenza from Nasopharyngeal swab specimens and  should not be used as a sole basis for treatment. Nasal washings and  aspirates are unacceptable for Xpert Xpress SARS-CoV-2/FLU/RSV  testing.  Fact Sheet for Patients: PinkCheek.be  Fact Sheet for Healthcare  Providers: GravelBags.it  This test is not yet approved or cleared by the [place] FDA and  has been authorized for detection and/or diagnosis of SARS-CoV-2 by  FDA under an Emergency Use Authorization (EUA). This EUA will remain  in effect (meaning this test can be used) for the duration of the  Covid-19 declaration under Section 564(b)(1) of the Act, 21  U.S.C. section 360bbb-3(b)(1), unless the authorization is  terminated or revoked. Performed at [facility], [address] [place], [place] [address]   Culture, blood (routine x 2)     Status: None   Collection Time: 08/31/20 10:18 AM   Specimen: BLOOD  Result Value Ref Range Status   Specimen Description   Final    BLOOD BLOOD LEFT ARM Performed at [facility], [place] [address] [place], [place] [address]    Special Requests   Final    BOTTLES DRAWN AEROBIC AND ANAEROBIC Blood Culture adequate volume Performed at [facility], [place] [address], [place], [place] [address]    Culture   Final    NO GROWTH 5 DAYS Performed at [facility], [address], [place],  [address]    Report Status 09/05/2020 FINAL  Final  MRSA PCR Screening     Status: None   Collection Time: 08/31/20  6:27 PM   Specimen: Nasopharyngeal  Result Value Ref Range Status   MRSA by PCR NEGATIVE NEGATIVE Final    Comment:        The GeneXpert MRSA Assay (FDA approved for NASAL specimens only), is one component of a comprehensive MRSA colonization surveillance program. It is not intended to diagnose MRSA infection nor to guide or monitor treatment for  MRSA infections. Performed at [facility], [place] [address] [place], [place] [address]   Culture, respiratory     Status: None   Collection Time: 09/01/20 12:11 AM   Specimen: Tracheal Aspirate  Result Value Ref Range Status   Specimen Description TRACHEAL ASPIRATE  Final   Special Requests NONE  Final    Gram Stain   Final    FEW WBC PRESENT, PREDOMINANTLY PMN FEW GRAM NEGATIVE RODS RARE GRAM POSITIVE COCCI Performed at [facility], [place] [address] [place], [place] [address]    Culture MODERATE SERRATIA MARCESCENS  Final   Report Status 09/03/2020 FINAL  Final   Organism ID, Bacteria SERRATIA MARCESCENS  Final      Susceptibility   Serratia marcescens - MIC*    CEFAZOLIN >=64 RESISTANT Resistant     CEFEPIME <=0.12 SENSITIVE Sensitive     CEFTAZIDIME <=1 SENSITIVE Sensitive     CEFTRIAXONE <=0.25 SENSITIVE Sensitive     CIPROFLOXACIN <=0.25 SENSITIVE Sensitive     GENTAMICIN <=1 SENSITIVE Sensitive     TRIMETH/SULFA <=20 SENSITIVE Sensitive     * MODERATE SERRATIA MARCESCENS  Culture, blood (single)     Status: None   Collection Time: 09/01/20  2:55 AM   Specimen: BLOOD  Result Value Ref Range Status   Specimen Description BLOOD SITE NOT SPECIFIED  Final   Special Requests   Final    BOTTLES DRAWN AEROBIC AND ANAEROBIC Blood Culture adequate volume   Culture   Final    NO GROWTH 5 DAYS Performed at [facility], [address] [place],  [address]    Report Status 09/06/2020 FINAL  Final         Radiology Studies: No results found.      Scheduled Meds: . acetaminophen  650 mg Oral Once  . chlorhexidine  15 mL Mouth Rinse BID  . Chlorhexidine Gluconate Cloth  6 each Topical Daily  . docusate sodium  100 mg Oral Daily  . enoxaparin (LOVENOX) injection  70 mg Subcutaneous Q24H  . feeding supplement  237 mL Oral BID BM  . furosemide  40 mg Intravenous BID  . insulin aspart  0-20 Units Subcutaneous Q4H  . lactulose  10 g Oral TID  . mouth rinse  15 mL Mouth Rinse q12n4p  . methadone  30 mg Oral Daily  . pantoprazole sodium  40 mg Oral Q24H  . polyethylene glycol  17 g Oral Daily  . potassium chloride  40 mEq Oral Once  . QUEtiapine  25 mg Oral QHS  . venlafaxine XR  225 mg Oral Q1200   Continuous Infusions: . sodium chloride 10 mL/hr at  09/04/20 2125  . ceFEPime (MAXIPIME) IV 2 g (09/07/20 0545)     LOS: 7 days    Time spent: 35 mins.More than 50% of that time was spent in counseling and/or coordination of care.      Shelly Coss, MD [facility] P11/04/2020, 8:23 AM

## 2020-09-07 NOTE — Evaluation (Signed)
Clinical/Bedside Swallow Evaluation Patient Details  Name: Jaime [name] MRN: [id number] Date of Birth: 1985/12/05  Today's Date: 09/07/2020 Time: SLP Start Time (ACUTE ONLY): 3382 SLP Stop Time (ACUTE ONLY): 0932 SLP Time Calculation (min) (ACUTE ONLY): 14 min  Past Medical History:  Past Medical History:  Diagnosis Date  . Anxiety   . Asthma   . Chronic abdominal pain   . Chronic back pain   . Cirrhosis ([place])   . Cirrhosis ([place])   . COPD (chronic obstructive pulmonary disease) ([place])   . Depression   . Diabetes mellitus without complication ([place])   . Diabetes mellitus, type II ([place])   . Hepatitis C   . Insomnia   . Long-term current use of methadone for opiate dependence ([place])   . Lupus ([place])   . Migraine headache   . Neuropathy   . Nocturnal seizures ([place])   . Peptic ulcer   . Spleen enlarged    Past Surgical History:  Past Surgical History:  Procedure Laterality Date  . 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 She was also found to be hypoglycemic. She did not respond to Narcan or glucose. She was intubated for airway protection 10/30-11/1.  Hospital course remarkable for encephalopathy with slow improvement.   Assessment / Plan / Recommendation Clinical Impression  Pt presents with mild-moderate oral dysphagia presumed to be related to cognitive impairments and edentulism.  Pt does not always wear dentures with PO intake, but family (mother Ivin Booty present) will bring denture plates to aid with mastication.  Family reports oral holding of mechanical soft breakfast tray this morning, but good intake of puree and liquids. Pt tolerated all consistencies trialed with no clinical s/s of aspiration. There was good oral clearance of puree. Pt expectorated soft cracker.  With  fruit cup, pt masticated and swallowed with encouragement and required additional time.    Recommend chopped/ground (dysphagia 2) diet for ease of mastication.    Expect pt will be able to advance as mental status improves and with presence of dentures.  SLP to follow in house.   SLP Visit Diagnosis: Dysphagia, oral phase (R13.11)    Aspiration Risk  Mild aspiration risk    Diet Recommendation Dysphagia 2 (Fine chop);Thin liquid   Liquid Administration via: Cup;Straw Medication Administration:  (as tolerated, crush if needed) Supervision: Staff to assist with self feeding Compensations: Slow rate;Small sips/bites Postural Changes: Seated upright at 90 degrees    Other  Recommendations Oral Care Recommendations: Oral care BID   Follow up Recommendations  (TBD)      Frequency and Duration min 2x/week  2 weeks       Prognosis Prognosis for Safe Diet Advancement: Good      Swallow Study   General Date of Onset: 08/31/20 HPI: Patient is a 34 year old female with past medical history of polysubstance abuse, 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 She was also found to be hypoglycemic. She did not respond to Narcan or glucose. She was intubated for airway protection 10/30-11/1.  Hospital course remarkable for encephalopathy with slow improvement. Type of Study: Bedside Swallow Evaluation Previous Swallow Assessment: none Diet Prior to this Study: Dysphagia 3 (soft);Thin liquids Temperature Spikes Noted: No Respiratory Status: Room air History of  Recent Intubation: Yes Length of Intubations (days): 2 days Date extubated: 09/02/20 Behavior/Cognition: Cooperative;Alert;Pleasant mood Oral Cavity Assessment:  (Difficult to assess, seemingly WFL) Oral Care Completed by SLP: No Oral Cavity - Dentition: Edentulous Vision: Functional for self-feeding Self-Feeding Abilities: Needs  assist Patient Positioning: Upright in bed Baseline Vocal Quality: Normal Volitional Cough: Weak Volitional Swallow: Unable to elicit    Oral/Motor/Sensory Function Overall Oral Motor/Sensory Function: Mild impairment Facial ROM: Within Functional Limits Facial Symmetry: Within Functional Limits Lingual ROM: Within Functional Limits Lingual Symmetry: Within Functional Limits Lingual Strength: Reduced Velum:  (unable to assess) Mandible: Impaired (reduced excursion)   Ice Chips     Thin Liquid Thin Liquid: Within functional limits Presentation: Straw    Nectar Thick     Honey Thick     Puree Puree: Within functional limits Presentation: Spoon   Solid     Solid: Impaired Presentation: Spoon Oral Phase Impairments: Impaired mastication Oral Phase Functional Implications: Oral holding;Prolonged oral transit      Celedonio Savage , [place], [place] Office: [phone]; Pager 911/6): [phone] 09/07/2020,10:24 AM

## 2020-09-07 NOTE — Consult Note (Signed)
[facility] Face-to-Face Psychiatry Consult   Reason for Consult:'' mental status change,H/O chronic opoid dependence. I strongly suspect underlying psychiatric issues,neurology workup completed. Will appreciate ur help.''  Referring Physician: Shelly Coss, MD  Patient Identification: Jaime [name] MRN:  [id number] Principal Diagnosis: Adjustment disorder with depressed mood Diagnosis:  Principal Problem:   Adjustment disorder with depressed mood Active Problems:   Pressure injury of skin   Respiratory failure ([place])   Acute encephalopathy   Total Time spent with patient: 1 hour  Subjective:   Jaime [name] is a 34 y.o. female patient admitted due to being to being unresponsive with hypoglycemia.  HPI: 34 year old female with past medical history of polysubstance abuse(Opioid dependence on Methadone), liver cirrhosis secondary to hepatitis C/NASH, Morbid obesity, Diabetes type 2 who was brought to the hospital by her family after she was found unresponsive. Patient is currently non-communicative but able to answer questions by nodding her head and gesticulating with her hands. Her mother who is at her bedside reports that patient was perfectly fine until 08/31/20 before  she was found unresponsive. She reports that the whole family spent a week together in [facility] and came back 08/30/20 with no incidence. Patient reports history of Major depression and Anxiety for which she has been receiving medication management from Dr. Norman Clay in [place], [place]. She has been her psychiatric every month and last saw her on 08/23/20. Today, patient alert, awake, cooperative  but non-verbal. She denies psychosis, delusions, depression, anxiety and self harming thoughts. She also denies illicit drug or alcohol abuse other than taking Methadone for Opioid addiction.  Past Psychiatric History: Major depression  Risk to Self:   Risk to Others:   Prior Inpatient Therapy:   Prior Outpatient  Therapy:    Past Medical History:  Past Medical History:  Diagnosis Date  . Anxiety   . Asthma   . Chronic abdominal pain   . Chronic back pain   . Cirrhosis ([place])   . Cirrhosis ([place])   . COPD (chronic obstructive pulmonary disease) ([place])   . Depression   . Diabetes mellitus without complication ([place])   . Diabetes mellitus, type II ([place])   . Hepatitis C   . Insomnia   . Long-term current use of methadone for opiate dependence ([place])   . Lupus ([place])   . Migraine headache   . Neuropathy   . Nocturnal seizures ([place])   . Peptic ulcer   . Spleen enlarged     Past Surgical History:  Procedure Laterality Date  . CHOLECYSTECTOMY     Family History:  Family History  Problem Relation Age of Onset  . Hypertension Mother   . Hyperlipidemia Mother   . Hyperlipidemia Maternal Grandfather    Family Psychiatric  History:  Social History:  Social History   Substance and Sexual Activity  Alcohol Use No     Social History   Substance and Sexual Activity  Drug Use No    Social History   Socioeconomic History  . Marital status: Single    Spouse name: Not on file  . Number of children: Not on file  . Years of education: Not on file  . Highest education level: Not on file  Occupational History  . Not on file  Tobacco Use  . Smoking status: Current Every Day Smoker    Packs/day: 0.00    Years: 5.00    Pack years: 0.00    Types: E-cigarettes  . Smokeless tobacco: Never Used  Vaping  Use  . Vaping Use: Every day  . Substances: Nicotine, Flavoring  Substance and Sexual Activity  . Alcohol use: No  . Drug use: No  . Sexual activity: Not Currently    Birth control/protection: Implant  Other Topics Concern  . Not on file  Social History Narrative  . Not on file   Social Determinants of Health   Financial Resource Strain: Medium Risk  . Difficulty of Paying Living Expenses: Somewhat hard  Food Insecurity: No Food Insecurity  . Worried About Charity fundraiser in the  Last Year: Never true  . Ran Out of Food in the Last Year: Never true  Transportation Needs: No Transportation Needs  . Lack of Transportation (Medical): No  . Lack of Transportation (Non-Medical): No  Physical Activity: Insufficiently Active  . Days of Exercise per Week: 1 day  . Minutes of Exercise per Session: 20 min  Stress: Stress Concern Present  . Feeling of Stress : Very much  Social Connections: Moderately Isolated  . Frequency of Communication with Friends and Family: More than three times a week  . Frequency of Social Gatherings with Friends and Family: Twice a week  . Attends Religious Services: More than 4 times per year  . Active Member of Clubs or Organizations: No  . Attends Archivist Meetings: Never  . Marital Status: Never married   Additional Social History:    Allergies:   Allergies  Allergen Reactions  . Iodine-131 Anaphylaxis, Shortness Of Breath and Swelling  . Ivp Dye [Iodinated Diagnostic Agents] Anaphylaxis    Skin gets very red, unable to walk   . Ketorolac Tromethamine Shortness Of Breath  . Tylenol [Acetaminophen] Other (See Comments)    Due to cirrhosis   . Gabapentin   . Morphine And Related     Pt says she is not allergic to Morphine 08/12/20  . Nsaids Other (See Comments)    Flares ulcers  . Suboxone [Buprenorphine Hcl-Naloxone Hcl]   . Vancomycin Swelling    Facial swelling,     Labs:  Results for orders placed or performed during the hospital encounter of 08/31/20 (from the past 48 hour(s))  Ammonia     Status: Abnormal   Collection Time: 09/05/20  2:00 PM  Result Value Ref Range   Ammonia 54 (H) 9 - 35 umol/L    Comment: Performed at [facility] [address] [place], [place] [address]  Glucose, capillary     Status: Abnormal   Collection Time: 09/05/20  4:14 PM  Result Value Ref Range   Glucose-Capillary 127 (H) 70 - 99 mg/dL    Comment: Glucose reference range applies only to samples taken after fasting  for at least 8 hours.  Glucose, capillary     Status: Abnormal   Collection Time: 09/05/20  7:27 PM  Result Value Ref Range   Glucose-Capillary 155 (H) 70 - 99 mg/dL    Comment: Glucose reference range applies only to samples taken after fasting for at least 8 hours.  Glucose, capillary     Status: Abnormal   Collection Time: 09/05/20 11:47 PM  Result Value Ref Range   Glucose-Capillary 126 (H) 70 - 99 mg/dL    Comment: Glucose reference range applies only to samples taken after fasting for at least 8 hours.  Glucose, capillary     Status: Abnormal   Collection Time: 09/06/20  3:04 AM  Result Value Ref Range   Glucose-Capillary 114 (H) 70 - 99 mg/dL  Comment: Glucose reference range applies only to samples taken after fasting for at least 8 hours.  CBC     Status: Abnormal   Collection Time: 09/06/20  3:37 AM  Result Value Ref Range   WBC 2.9 (L) 4.0 - 10.5 K/uL   RBC 3.18 (L) 3.87 - 5.11 MIL/uL   Hemoglobin 9.4 (L) 12.0 - 15.0 g/dL   HCT 29.4 (L) 36 - 46 %   MCV 92.5 80.0 - 100.0 fL   MCH 29.6 26.0 - 34.0 pg   MCHC 32.0 30.0 - 36.0 g/dL   RDW 17.1 (H) 11.5 - 15.5 %   Platelets 73 (L) 150 - 400 K/uL    Comment: REPEATED TO VERIFY SPECIMEN CHECKED FOR CLOTS Immature Platelet Fraction may be clinically indicated, consider ordering this additional test [id number] CONSISTENT WITH PREVIOUS RESULT    nRBC 0.0 0.0 - 0.2 %    Comment: Performed at [facility], [place] [address] [place], [place] [address]  Basic metabolic panel     Status: Abnormal   Collection Time: 09/06/20  3:37 AM  Result Value Ref Range   Sodium 142 135 - 145 mmol/L   Potassium 3.3 (L) 3.5 - 5.1 mmol/L   Chloride 107 98 - 111 mmol/L   CO2 26 22 - 32 mmol/L   Glucose, Bld 124 (H) 70 - 99 mg/dL    Comment: Glucose reference range applies only to samples taken after fasting for at least 8 hours.   BUN 9 6 - 20 mg/dL   Creatinine, Ser 0.56 0.44 - 1.00 mg/dL   Calcium 8.4 (L) 8.9 - 10.3 mg/dL   GFR,  Estimated >60 >60 mL/min    Comment: (NOTE) Calculated using the CKD-EPI Creatinine Equation (2021)    Anion gap 9 5 - 15    Comment: Performed at [facility] [address] [place], [place] [address]  Ammonia     Status: Abnormal   Collection Time: 09/06/20  3:37 AM  Result Value Ref Range   Ammonia 44 (H) 9 - 35 umol/L    Comment: Performed at [facility], [place] [address] [place], [place] [address]  Magnesium     Status: None   Collection Time: 09/06/20  3:37 AM  Result Value Ref Range   Magnesium 1.8 1.7 - 2.4 mg/dL    Comment: Performed at [facility] [address] [place], [place] [address]  Glucose, capillary     Status: Abnormal   Collection Time: 09/06/20  7:37 AM  Result Value Ref Range   Glucose-Capillary 114 (H) 70 - 99 mg/dL    Comment: Glucose reference range applies only to samples taken after fasting for at least 8 hours.  Glucose, capillary     Status: Abnormal   Collection Time: 09/06/20 12:07 PM  Result Value Ref Range   Glucose-Capillary 163 (H) 70 - 99 mg/dL    Comment: Glucose reference range applies only to samples taken after fasting for at least 8 hours.  Glucose, capillary     Status: Abnormal   Collection Time: 09/06/20  4:06 PM  Result Value Ref Range   Glucose-Capillary 195 (H) 70 - 99 mg/dL    Comment: Glucose reference range applies only to samples taken after fasting for at least 8 hours.  Glucose, capillary     Status: Abnormal   Collection Time: 09/06/20  7:20 PM  Result Value Ref Range   Glucose-Capillary 163 (H) 70 - 99 mg/dL  Comment: Glucose reference range applies only to samples taken after fasting for at least 8 hours.  Glucose, capillary     Status: Abnormal   Collection Time: 09/06/20 11:19 PM  Result Value Ref Range   Glucose-Capillary 133 (H) 70 - 99 mg/dL    Comment: Glucose reference range applies only to samples taken after fasting for at least 8 hours.  Glucose, capillary     Status: Abnormal    Collection Time: 09/07/20  3:14 AM  Result Value Ref Range   Glucose-Capillary 125 (H) 70 - 99 mg/dL    Comment: Glucose reference range applies only to samples taken after fasting for at least 8 hours.  CBC     Status: Abnormal   Collection Time: 09/07/20  4:50 AM  Result Value Ref Range   WBC 2.7 (L) 4.0 - 10.5 K/uL   RBC 3.11 (L) 3.87 - 5.11 MIL/uL   Hemoglobin 9.2 (L) 12.0 - 15.0 g/dL   HCT 29.0 (L) 36 - 46 %   MCV 93.2 80.0 - 100.0 fL   MCH 29.6 26.0 - 34.0 pg   MCHC 31.7 30.0 - 36.0 g/dL   RDW 17.4 (H) 11.5 - 15.5 %   Platelets 74 (L) 150 - 400 K/uL    Comment: REPEATED TO VERIFY Immature Platelet Fraction may be clinically indicated, consider ordering this additional test [id number] CONSISTENT WITH PREVIOUS RESULT    nRBC 0.0 0.0 - 0.2 %    Comment: Performed at [facility], [place] [address] [place],  [address]  Basic metabolic panel     Status: Abnormal   Collection Time: 09/07/20  4:50 AM  Result Value Ref Range   Sodium 142 135 - 145 mmol/L   Potassium 3.4 (L) 3.5 - 5.1 mmol/L   Chloride 106 98 - 111 mmol/L   CO2 26 22 - 32 mmol/L   Glucose, Bld 128 (H) 70 - 99 mg/dL    Comment: Glucose reference range applies only to samples taken after fasting for at least 8 hours.   BUN 11 6 - 20 mg/dL   Creatinine, Ser 0.58 0.44 - 1.00 mg/dL   Calcium 8.6 (L) 8.9 - 10.3 mg/dL   GFR, Estimated >60 >60 mL/min    Comment: (NOTE) Calculated using the CKD-EPI Creatinine Equation (2021)    Anion gap 10 5 - 15    Comment: Performed at [facility] [address] [place], [place] [address]  Glucose, capillary     Status: Abnormal   Collection Time: 09/07/20  7:57 AM  Result Value Ref Range   Glucose-Capillary 114 (H) 70 - 99 mg/dL    Comment: Glucose reference range applies only to samples taken after fasting for at least 8 hours.  Glucose, capillary     Status: Abnormal   Collection Time: 09/07/20 11:53 AM  Result Value Ref Range   Glucose-Capillary 153 (H)  70 - 99 mg/dL    Comment: Glucose reference range applies only to samples taken after fasting for at least 8 hours.    Current Facility-Administered Medications  Medication Dose Route Frequency Provider Last Rate Last Admin  . 0.9 %  sodium chloride infusion   Intravenous Continuous Erick Colace, NP 10 mL/hr at 09/04/20 2125 New Bag at 09/04/20 2125  . acetaminophen (TYLENOL) tablet 650 mg  650 mg Oral Once Brimage, Vondra, DO      . ceFEPIme (MAXIPIME) 2 g in sodium chloride 0.9 % 100 mL IVPB  2 g Intravenous  Q8H Spero Geralds, MD 200 mL/hr at 09/07/20 0545 2 g at 09/07/20 0545  . chlorhexidine (PERIDEX) 0.12 % solution 15 mL  15 mL Mouth Rinse BID Spero Geralds, MD   15 mL at 09/07/20 1049  . Chlorhexidine Gluconate Cloth 2 % PADS 6 each  6 each Topical Daily Judd Lien, MD   6 each at 09/07/20 1049  . docusate sodium (COLACE) capsule 100 mg  100 mg Oral Daily Spero Geralds, MD   100 mg at 09/07/20 1049  . enoxaparin (LOVENOX) injection 70 mg  70 mg Subcutaneous Q24H Shelly Coss, MD   70 mg at 09/07/20 1052  . feeding supplement (ENSURE ENLIVE / ENSURE PLUS) liquid 237 mL  237 mL Oral BID BM Spero Geralds, MD   237 mL at 09/07/20 1049  . furosemide (LASIX) injection 40 mg  40 mg Intravenous BID Spero Geralds, MD   40 mg at 09/07/20 1049  . insulin aspart (novoLOG) injection 0-20 Units  0-20 Units Subcutaneous Q4H Chesley Mires, MD   4 Units at 09/07/20 1216  . lactulose (CHRONULAC) 10 GM/15ML solution 10 g  10 g Oral TID Shelly Coss, MD   10 g at 09/07/20 1050  . MEDLINE mouth rinse  15 mL Mouth Rinse q12n4p Spero Geralds, MD   15 mL at 09/06/20 1628  . methadone (DOLOPHINE) tablet 30 mg  30 mg Oral Daily Spero Geralds, MD   30 mg at 09/07/20 1051  . pantoprazole sodium (PROTONIX) 40 mg/20 mL oral suspension 40 mg  40 mg Oral Q24H Romilda Garret, RPH   40 mg at 09/06/20 2876  . polyethylene glycol (MIRALAX / GLYCOLAX) packet 17 g  17 g Oral Daily Spero Geralds,  MD   17 g at 09/07/20 1051  . QUEtiapine (SEROQUEL) tablet 25 mg  25 mg Oral QHS Magdalen Spatz, NP   25 mg at 09/06/20 2126  . venlafaxine XR (EFFEXOR-XR) 24 hr capsule 225 mg  225 mg Oral Q1200 Magdalen Spatz, NP   225 mg at 09/07/20 1215    Musculoskeletal: Strength & Muscle Tone: not tested Gait & Station: normal Patient leans: N/A  Psychiatric Specialty Exam: Physical Exam Psychiatric:        Attention and Perception: Attention and perception normal.        Mood and Affect: Mood and affect normal.        Speech: She is noncommunicative.        Behavior: Behavior normal. Behavior is cooperative.        Thought Content: Thought content normal.        Cognition and Memory: Cognition and memory normal.        Judgment: Judgment normal.     Review of Systems  Constitutional: Negative.   HENT: Negative.   Eyes: Negative.   Psychiatric/Behavioral: Negative.     Blood pressure 139/80, pulse (!) 106, temperature 98.3 F (36.8 C), temperature source Oral, resp. rate 20, height 5' 9"  (1.753 m), weight (!) 144.2 kg, SpO2 95 %.Body mass index is 46.95 kg/m.  General Appearance: Casual  Eye Contact:  Good  Speech:  Blocked  Volume:  unable to assess, patient is non-communicative  Mood:  Dysphoric  Affect:  Constricted  Thought Process: unable to assess, patient is non-communicative  Orientation:  Other:  unable to assess, patient is non-communicative  Thought Content:  unable to assess, patient is non-communicative  Suicidal Thoughts:  No  Homicidal Thoughts:  No  Memory:  unable to assess, patient is non-communicative  Judgement:  Fair  Insight:  Fair  Psychomotor Activity:  Psychomotor Retardation  Concentration:  Concentration: Fair and Attention Span: Fair  Recall:  unable to assess, patient is Medical sales representative of Knowledge:  unable to assess, patient is non-communicative  Language:  unable to assess, patient is non-communicative  Akathisia:  No  Handed:  Right   AIMS (if indicated):     Assets:  Housing Social Support  ADL's:  Intact  Cognition:  WNL  Sleep:        Treatment Plan Summary: 34 year old female with history of Major depression and Opioid addiction who was brought to the hospital  After she was found unresponsive. Patient is currently non-communicative but cooperative and able to answer questions by nodding her head yes or no. Patient is calm, alert, denies psychosis, delusions, depression and self harming thoughts. She seems stable on her current psychiatric medications. Based on my evaluation today, I am unable to link her non-communication to any underlying psychiatric issues or any family issues based on her mother's reports.  Recommendations: -Continue current psychiatric medications. -Continue treatment with speech and language pathologist  -Refer patient to Dr. Elie Goody [place]-psychiatrist in [place], [place] upon discharge  Disposition: No evidence of imminent risk to self or others at present.   Patient does not meet criteria for psychiatric inpatient admission. Psychiatric service signing out. Re-consult as needed  Corena Pilgrim, MD 09/07/2020 1:53 PM

## 2020-09-08 LAB — GLUCOSE, CAPILLARY
Glucose-Capillary: 127 mg/dL — ABNORMAL HIGH (ref 70–99)
Glucose-Capillary: 122 mg/dL — ABNORMAL HIGH (ref 70–99)
Glucose-Capillary: 145 mg/dL — ABNORMAL HIGH (ref 70–99)
Glucose-Capillary: 150 mg/dL — ABNORMAL HIGH (ref 70–99)
Glucose-Capillary: 153 mg/dL — ABNORMAL HIGH (ref 70–99)
Glucose-Capillary: 120 mg/dL — ABNORMAL HIGH (ref 70–99)

## 2020-09-08 LAB — BASIC METABOLIC PANEL
Anion gap: 9 (ref 5–15)
BUN: 12 mg/dL (ref 6–20)
CO2: 26 mmol/L (ref 22–32)
Calcium: 8.7 mg/dL — ABNORMAL LOW (ref 8.9–10.3)
Chloride: 108 mmol/L (ref 98–111)
Creatinine, Ser: 0.53 mg/dL (ref 0.44–1.00)
GFR, Estimated: >60 mL/min (ref >60)
Glucose, Bld: 124 mg/dL — ABNORMAL HIGH (ref 70–99)
Potassium: 3.5 mmol/L (ref 3.5–5.1)
Sodium: 143 mmol/L (ref 135–145)

## 2020-09-08 LAB — VITAMIN B12: Vitamin B-12: 326 pg/mL (ref 180–914)

## 2020-09-08 LAB — AMMONIA: Ammonia: 43 umol/L — ABNORMAL HIGH (ref 9–35)

## 2020-09-08 LAB — TSH: TSH: 0.635 u[IU]/mL (ref 0.350–4.500)

## 2020-09-08 MED ORDER — FUROSEMIDE 40 MG PO TABS
40.0000 mg | ORAL_TABLET | Freq: Two times a day (BID) | ORAL | Status: DC
  Administered 2020-09-08 – 2020-09-09 (×2): 40 mg via ORAL
  Filled 2020-09-08 (×2): qty 1

## 2020-09-08 NOTE — Consult Note (Addendum)
Neurology Consultation  Reason for Consult: altered mental status  Referring Physician: Tawanna Solo  CC: altered mentation  History is obtained from:chart as patient does not speak at this time.  HPI: Jaime [name] is a 34 y.o. 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 Reports are that the patient just returned from family vacation and returned with no incidence. On day of presentation to the hospital she was awake, alert, but non-verbal. She remains  non-communicative but able to answer questions by nodding her head and gesticulating with her hands at the time of this exam.  Workup done during this hospitalization includes an unremarkable noncontrast head CT, as well as an unremarkable MRI brain without contrast.  CBC reveals a chronic pancytopenia Ammonia levels are elevated but trending downwards.   Psychiatry has been consulted and were "unable to link her non-communication to any underlying psychiatric issues."  Neurology was asked to evaluate this patient for this altered mental status, and we thank the primary team for the kind referral.    ROS: unable to obtain d/t pt status.   Past Medical History:  Diagnosis Date  . Anxiety   . Asthma   . Chronic abdominal pain   . Chronic back pain   . Cirrhosis ([place])   . Cirrhosis ([place])   . COPD (chronic obstructive pulmonary disease) ([place])   . Depression   . Diabetes mellitus without complication ([place])   . Diabetes mellitus, type II ([place])   . Hepatitis C   . Insomnia   . Long-term current use of methadone for opiate dependence ([place])   . Lupus ([place])   . Migraine headache   . Neuropathy   . Nocturnal seizures ([place])   .  Peptic ulcer   . Spleen enlarged       Family History  Problem Relation Age of Onset  . Hypertension Mother   . Hyperlipidemia Mother   . Hyperlipidemia Maternal Grandfather      Current Facility-Administered Medications:  .  0.9 %  sodium chloride infusion, , Intravenous, Continuous, Erick Colace, NP, Last Rate: 10 mL/hr at 09/04/20 2125, New Bag at 09/04/20 2125 .  acetaminophen (TYLENOL) tablet 650 mg, 650 mg, Oral, Once, Brimage, Vondra, DO .  chlorhexidine (PERIDEX) 0.12 % solution 15 mL, 15 mL, Mouth Rinse, BID, Spero Geralds, MD, 15 mL at 09/07/20 2024 .  Chlorhexidine Gluconate Cloth 2 % PADS 6 each, 6 each, Topical, Daily, [place], Shona Needles, MD, 6 each at 09/07/20 1049 .  docusate sodium (COLACE) capsule 100 mg, 100 mg, Oral, Daily, Spero Geralds, MD, 100 mg at 09/08/20 1002 .  enoxaparin (LOVENOX) injection 70 mg, 70 mg, Subcutaneous, Q24H, Adhikari, Amrit, MD, 70 mg at 09/08/20 1002 .  feeding supplement (ENSURE ENLIVE / ENSURE PLUS) liquid 237 mL, 237 mL, Oral, BID BM, Spero Geralds, MD, 237 mL at 09/07/20 1748 .  furosemide (LASIX) tablet 40 mg, 40 mg, Oral, BID, Adhikari, Amrit, MD, 40 mg at 09/08/20 1653 .  insulin aspart (novoLOG) injection 0-20 Units, 0-20 Units, Subcutaneous, Q4H, Chesley Mires, MD, 4 Units at 09/08/20 1936 .  lactulose (CHRONULAC) 10 GM/15ML solution 10 g, 10 g, Oral, TID, Adhikari, Amrit, MD, 10 g at 09/08/20 1654 .  MEDLINE mouth  rinse, 15 mL, Mouth Rinse, q12n4p, Spero Geralds, MD, 15 mL at 09/07/20 1200 .  methadone (DOLOPHINE) tablet 30 mg, 30 mg, Oral, Daily, Spero Geralds, MD, 30 mg at 09/08/20 0958 .  pantoprazole sodium (PROTONIX) 40 mg/20 mL oral suspension 40 mg, 40 mg, Oral, Q24H, Romilda Garret, RPH, 40 mg at 09/08/20 1234 .  polyethylene glycol (MIRALAX / GLYCOLAX) packet 17 g, 17 g, Oral, Daily, Spero Geralds, MD, 17 g at 09/08/20 0959 .  QUEtiapine (SEROQUEL) tablet 25 mg, 25 mg, Oral, QHS, Magdalen Spatz, NP, 25 mg at  09/07/20 2024 .  venlafaxine XR (EFFEXOR-XR) 24 hr capsule 225 mg, 225 mg, Oral, Q1200, Magdalen Spatz, NP, 225 mg at 09/08/20 1234   Social History:  reports that she has been smoking e-cigarettes. She has been smoking about 0.00 packs per day for the past 5.00 years. She has never used smokeless tobacco. She reports that she does not drink alcohol and does not use drugs.    Exam: Current vital signs: BP 132/69 (BP Location: Left Arm)   Pulse 89   Temp 97.8 F (36.6 C) (Oral)   Resp 18   Ht 5' 9"  (1.753 m)   Wt (!) 144.2 kg   SpO2 95%   BMI 46.95 kg/m  Vital signs in last 24 hours: Temp:  [97.8 F (36.6 C)-98.3 F (36.8 C)] 97.8 F (36.6 C) (11/07 0552) Pulse Rate:  [89-102] 89 (11/07 0552) Resp:  [18] 18 (11/07 0552) BP: (132-142)/(69-89) 132/69 (11/07 0552) SpO2:  [93 %-95 %] 95 % (11/07 0552)   Physical Exam  Constitutional: Appears well-developed, obese, and well-nourished.  Psych: Affect appropriate to situation Eyes: No scleral injection HENT: No OP obstrucion MSK: no joint deformities.  Cardiovascular: Normal rate and regular rhythm.  Respiratory: Effort normal, non-labored breathing GI: Soft.  No distension. There is no tenderness.  Skin: WDI  Neuro: Mental Status: pt seen sitting up in room, grandfather at bedside providing support. Pt is mostly non-verbal but does attempt to mouth words and hand gesticulations. When asked questions she appears to stutter, thrushes her tongue and gives one word answers.  Follows all commands. [for Dr. Curly [place], occasionally required mimicry to follow commands but not always) Patient is awake, alert, appears to understand Patient is unable to give a clear and coherent history.    Cranial Nerves: II: Visual Fields are full. Pupils are equal, round, and reactive to light.    III,IV, VI: EOMI without ptosis or diploplia.  V: Facial sensation is symmetric to temperature VII: Facial movement is symmetric.  VIII: hearing is intact  to voice X: Uvula elevates symmetrically XI: Shoulder shrug is symmetric. XII: tongue is midline without atrophy or fasciculations.   Motor: Tone is normal. Bulk is normal. 5/5 strength was present in all four extremities.    Sensory: Sensation is symmetric to light touch and temperature in the arms and legs.  Deep Tendon Reflexes: 2+ and symmetric in the biceps and patellae.    Plantars: Toes are downgoing bilaterally.    Cerebellar: Unable to assess at this time --for [facility] For Dr. Curly [place], the patient easily performed finger-to-nose testing and heel-to-shin testing which was normal      I have reviewed labs in epic and the results pertinent to this consultation are: Results for Jaime, [name] (MRN [id number]) as of 09/08/2020 13:27  Ref. Range 09/07/2020 04:50  WBC Latest Ref Range: 4.0 - 10.5 K/uL 2.7 (L)  RBC Latest Ref Range: 3.87 -  5.11 MIL/uL 3.11 (L)  Hemoglobin Latest Ref Range: 12.0 - 15.0 g/dL 9.2 (L)  HCT Latest Ref Range: 36 - 46 % 29.0 (L)  MCV Latest Ref Range: 80.0 - 100.0 fL 93.2  MCH Latest Ref Range: 26.0 - 34.0 pg 29.6  MCHC Latest Ref Range: 30.0 - 36.0 g/dL 31.7  RDW Latest Ref Range: 11.5 - 15.5 % 17.4 (H)  Platelets Latest Ref Range: 150 - 400 K/uL 74 (L)   Results for Jaime, [name] (MRN [id number]) as of 09/08/2020 13:27  Ref. Range 09/08/2020 09:10  Ammonia Latest Ref Range: 9 - 35 umol/L 43 (H)   I have reviewed the images obtained:  09/08/2020 MRI Brain:  No acute intracranial process.  EEG: 09/01/2020 study is suggestive of severe diffuse encephalopathy, nonspecific etiology but likely related to sedation.No seizures or definite epileptiform discharges were seen throughout the recording.  Impression: 34 year old female with history of polysubstance abuse on methadone admitted with altered mental status  Neurologic workup including neuroimaging unremarkable.  Recommendations: - continue with current psychiatric medications.  Unable at this time to find neurologic issues to explain her minimal communication. She is able to follow all commands and responds appropriately. Recommend outpatient neurologic follow up.   Jaime Lore  PA   This patient's examination is notable for being consistently inconsistent.  For example, she appears bemused in her affect.  Overall she seems to give appropriate yes/no answers, but when trialed for consistency she will stop being consistent.  At times she briskly follows commands, and very occasionally she simply does not do what is asked of her.  When asked to write down things for me she initially clearly writes down "Jaime [name]" and smiles quite happily when asked if that is what she wants me to call her.  Subsequently all of her writing consists of looping circles the go on and on across the page, even when asked her again to write her name.  However she easily participates in the neurological examination.  In a typical Broca's aphasia, patients are very frustrated by their inability to communicate.  Her affect does not reflect any such frustration.  In a true global aphasia I would not expect her to be able to write even her name.  The fact that at times she does write her name and that other times she does not is suggestive of volitional behavior.  I have not encountered a patient with this constellation of findings and suspect that there is no organic etiology  To distinguish whether her behavior at this time is related to lingering encephalopathy from the multiple toxic/metabolic issues with which she presented versus volitional behavior, EEG could be repeated.  Should the EEG has normalized, this would suggest volitional behavior.   - Routine EEEG  Lesleigh Noe MD-PhD [facility] [phone]

## 2020-09-08 NOTE — Progress Notes (Addendum)
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 She was also found to be hypoglycemic. She did not respond to Narcan or glucose. She was intubated for airway protection and was sent to [facility] from [place]. Currently her respiratory status is stable, status post extubation. Patient transferred to [facility] service on 09/05/20.  Hospital course remarkable for encephalopathy with slow improvement and aphagia.Neurology consulted    Assessment & Plan:   Principal Problem:   Adjustment disorder with depressed mood Active Problems:   Pressure injury of skin   Respiratory failure (HCC)   Acute encephalopathy   Acute metabolic encephalopathy: Thought to be secondary to hypoglycemia, polypharmacy. 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 MRI of the brain did not show any acute intracranial abnormalities. None also in CT.She was taking Klonopin, Flexeril, Lyrica which have are on hold. Resumed home Seroquel, Effexor and methadone.  Mildly elevated ammonia level. LTM EEG showed severe diffuse encephalopathy likely related to sedation, no epileptiform discharges. Her mental status has improved.  Follows commands but does not talk and she is alert and awake and follows commands. We requested psychiatry consultation.  Psychiatry recommended to continue current medication with outpatient follow-up. Neurology consulted today.  Acute hypoxic respiratory failure: She was intubated for airway protection. Hospital course remarkable for aspiration pneumonitis. Currently on room air.  Aspiration pneumonitis: Sputum culture positive for Serratia  marcescens.Treated with cefipime  UTI: Urine culture positive for Enterococcus faecalis. Treated with  ampicillin.  Acute on chronic diastolic congestive heart failure: Appeared volume overloaded on presentation. Takes Lasix at home. Continue current Lasix regimen. Also taking lisinopril, Crestor and Aldactone at home.Continue IV lasix for now  Cirrhosis/NASH/hepatitis C: She says she has been treated for hepatitis C. 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 She has chronic anasarca/ascites. Follow-up ammonia level. Continue lactulose. liver enzymes are almost normal.  Anasarca improving with IV Lasix  Diabetes type 2: On Victoza, Metformin,insulin at home. Continue sliding scale insulin here. Her recent hemoglobin A1c is 7.1. She is on extremely high-dose of insulin at home. May be she can be discharged only on oral medications. I have consulted diabetic coordinator consultation  Pancytopenia: Likely associated with chronic liver disease/chronic medical problems. Also has thrombocytopenia in the setting of cirrhosis. Continue to monitor CBC.  Hypokalemia: Being monitored and supplemented   Morbid obesity: BMI of 46.9  Debility/deconditioning:  PT/OT evaluation done and recommended HH      Nutrition Problem: Inadequate oral intake Etiology: acute illness      DVT prophylaxis:Lovenox Code Status: Full Family Communication: Grandfather  present at the bedside Status is: Inpatient  Remains inpatient appropriate because:Inpatient level of care appropriate due to severity of illness   Dispo: The patient is from: Home              Anticipated d/c is to: Home              Anticipated d/c date is: tomorrow              Patient currently is not medically stable to d/c. Needs further improvement in the mental status before discharge.    Consultants: PCCM  Procedures:Intubation  Antimicrobials:  Anti-infectives (From admission, onward)   Start     Dose/Rate Route  Frequency Ordered Stop   09/03/20 1400  ceFEPIme (MAXIPIME) 2 g in sodium chloride 0.9 % 100 mL IVPB        2 g 200 mL/hr over 30 Minutes Intravenous Every 8 hours 09/03/20 1233 09/08/20 1400   09/03/20 1330  ampicillin (OMNIPEN) 2 g in sodium chloride 0.9 % 100 mL IVPB        2 g 300 mL/hr over 20 Minutes Intravenous Every 4 hours 09/03/20 1233 09/06/20 1240   09/03/20 0930  piperacillin-tazobactam (ZOSYN) IVPB 3.375 g  Status:  Discontinued        3.375 g 12.5 mL/hr over 240 Minutes Intravenous Every 8 hours 09/03/20 0844 09/03/20 1233   08/31/20 2015  ceFEPIme (MAXIPIME) 2 g in sodium chloride 0.9 % 100 mL IVPB  Status:  Discontinued        2 g 200 mL/hr over 30 Minutes Intravenous Every 8 hours 08/31/20 1924 09/02/20 0813   08/31/20 1930  linezolid (ZYVOX) IVPB 600 mg  Status:  Discontinued        600 mg 300 mL/hr over 60 Minutes Intravenous Every 12 hours 08/31/20 1922 09/01/20 0810   08/31/20 1830  ceFEPIme (MAXIPIME) 2 g in sodium chloride 0.9 % 100 mL IVPB  Status:  Discontinued        2 g 200 mL/hr over 30 Minutes Intravenous  Once 08/31/20 1817 08/31/20 1924      Subjective:  Patient seen and examined at the bedside this morning.  Hemodynamically stable.  She looks better than yesterday.  Remains alert, awake and obeys commands.  She said a word "November "to me but the speech is not a spontaneous  Objective: Vitals:   09/07/20 1401 09/07/20 1900 09/07/20 2135 09/08/20 0552  BP: (!) 142/84 137/89 133/86 132/69  Pulse: (!) 102 93 93 89  Resp: 18 18 18 18   Temp: 98.2 F (36.8 C) 98.3 F (36.8 C) 98.1 F (36.7 C) 97.8 F (36.6 C)  TempSrc:  Oral Oral Oral  SpO2: 93% 95% 95% 95%  Weight:      Height:        Intake/Output Summary (Last 24 hours) at 09/08/2020 0827 Last data filed at 09/07/2020 2000 Gross per 24 hour  Intake --  Output 2200 ml  Net -2200 ml   Filed Weights   09/02/20 0400 09/03/20 0500 09/04/20 0358  Weight: (!) 150.5 kg (!) 146.5 kg (!) 144.2 kg     Examination:  General exam: Appears calm and comfortable ,Not in distress, morbidly obese HEENT:PERRL,Oral mucosa moist, Ear/Nose normal on gross exam Respiratory system: Bilateral equal air entry, normal vesicular breath sounds, no wheezes or crackles  Cardiovascular system: S1 & S2 heard, RRR. No JVD, murmurs, rubs, gallops or clicks. Gastrointestinal system: Abdomen is nondistended, soft and nontender. No organomegaly or masses felt. Normal bowel sounds heard. Central nervous system: Alert and awake. No focal neurological deficits. Extremities: Improving anasarca, no clubbing ,no cyanosis Skin: No rashes, lesions or ulcers,no icterus ,no pallor   Data Reviewed: I have personally reviewed following labs and imaging studies  CBC: Recent Labs  Lab 09/03/20 0803 09/04/20 0110 09/05/20 0027 09/06/20 0337 09/07/20 0450  WBC 2.9* 3.4* 3.8* 2.9* 2.7*  HGB 8.6* 8.8* 9.1* 9.4* 9.2*  HCT 26.8* 27.1* 28.5* 29.4* 29.0*  MCV 90.8 91.6 92.2 92.5 93.2  PLT 69* 70* 71* 73* 74*   Basic Metabolic Panel: Recent Labs  Lab 09/02/20 0345 09/02/20 1848 09/03/20 0803 09/04/20 0110 09/05/20 0027 09/06/20 0337 09/07/20 0450  NA 138   < > 139 139 141 142 142  K 4.3   < > 4.3 3.7 3.3* 3.3* 3.4*  CL 108  --  104 104 105 107 106  CO2 23  --  25 26 27 26 26   GLUCOSE 127*  --  187* 153* 121* 124* 128*  BUN 10  --  14 12 11 9 11   CREATININE 0.57  --  0.68 0.56 0.57 0.56 0.58  CALCIUM 7.6*  --  8.1* 8.0* 8.0* 8.4* 8.6*  MG 2.1  --   --  2.0  --  1.8  --   PHOS 2.8  --   --   --   --   --   --    < > = values in this interval not displayed.   GFR: Estimated Creatinine Clearance: 149.5 mL/min (by C-G formula based on SCr of 0.58 mg/dL). Liver Function Tests: Recent Labs  Lab 09/02/20 0345 09/04/20 0110  AST 83* 46*  ALT 34 31  ALKPHOS 73 82  BILITOT 0.7 0.6  PROT 5.8* 6.1*  ALBUMIN 2.2* 2.4*   No results for input(s): LIPASE, AMYLASE in the last 168 hours. Recent Labs  Lab  09/01/20 1151 09/03/20 0926 09/04/20 0110 09/05/20 1400 09/06/20 0337  AMMONIA 70* 48* 52* 54* 44*   Coagulation Profile: Recent Labs  Lab 09/02/20 0656  INR 1.2   Cardiac Enzymes: No results for input(s): CKTOTAL, CKMB, CKMBINDEX, TROPONINI in the last 168 hours. BNP (last 3 results) No results for input(s): PROBNP in the last 8760 hours. HbA1C: No results for input(s): HGBA1C in the last 72 hours. CBG: Recent Labs  Lab 09/07/20 1555 09/07/20 1942 09/07/20 2306 09/08/20 0304 09/08/20 0746  GLUCAP 271* 189* 130* 127* 122*   Lipid Profile: No results for input(s): CHOL, HDL, LDLCALC, TRIG, CHOLHDL, LDLDIRECT in the last 72 hours. Thyroid Function Tests: No results for input(s): TSH, T4TOTAL, FREET4, T3FREE, THYROIDAB in the last 72 hours. Anemia Panel: No results for input(s): VITAMINB12, FOLATE, FERRITIN, TIBC, IRON, RETICCTPCT in the last 72 hours. Sepsis Labs: No results for input(s): PROCALCITON, LATICACIDVEN in the last 168 hours.  Recent Results (from the past 240 hour(s))  Urine culture     Status: Abnormal   Collection Time: 08/31/20  9:21 AM   Specimen: Urine, Clean Catch  Result Value Ref Range Status   Specimen Description   Final    URINE, CLEAN CATCH Performed at [facility], [address] [place], [place] [address]    Special Requests   Final    NONE Performed at [facility], [address] [place], [place] [address]    Culture >=100,000 COLONIES/mL ENTEROCOCCUS FAECALIS (A)  Final   Report Status 09/03/2020 FINAL  Final   Organism ID, Bacteria ENTEROCOCCUS FAECALIS (A)  Final      Susceptibility   Enterococcus faecalis - MIC*    AMPICILLIN <=2 SENSITIVE Sensitive     NITROFURANTOIN <=16 SENSITIVE Sensitive     VANCOMYCIN 1 SENSITIVE Sensitive     * >=100,000 COLONIES/mL ENTEROCOCCUS FAECALIS  Respiratory Panel by RT PCR (Flu A&B, Covid) - Nasopharyngeal Swab     Status: None   Collection Time: 08/31/20  9:34 AM   Specimen: Nasopharyngeal  Swab  Result Value Ref Range Status   SARS Coronavirus 2 by RT PCR NEGATIVE NEGATIVE Final    Comment: (NOTE) SARS-CoV-2 target nucleic acids are  NOT DETECTED.  The SARS-CoV-2 RNA is generally detectable in upper respiratoy specimens during the acute phase of infection. The lowest concentration of SARS-CoV-2 viral copies this assay can detect is 131 copies/mL. A negative result does not preclude SARS-Cov-2 infection and should not be used as the sole basis for treatment or other patient management decisions. A negative result may occur with  improper specimen collection/handling, submission of specimen other than nasopharyngeal swab, presence of viral mutation(s) within the areas targeted by this assay, and inadequate number of viral copies (<131 copies/mL). A negative result must be combined with clinical observations, patient history, and epidemiological information. The expected result is Negative.  Fact Sheet for Patients:  PinkCheek.be  Fact Sheet for Healthcare Providers:  GravelBags.it  This test is no t yet approved or cleared by the [place] FDA and  has been authorized for detection and/or diagnosis of SARS-CoV-2 by FDA under an Emergency Use Authorization (EUA). This EUA will remain  in effect (meaning this test can be used) for the duration of the COVID-19 declaration under Section 564(b)(1) of the Act, 21 U.S.C. section 360bbb-3(b)(1), unless the authorization is terminated or revoked sooner.     Influenza A by PCR NEGATIVE NEGATIVE Final   Influenza B by PCR NEGATIVE NEGATIVE Final    Comment: (NOTE) The Xpert Xpress SARS-CoV-2/FLU/RSV assay is intended as an aid in  the diagnosis of influenza from Nasopharyngeal swab specimens and  should not be used as a sole basis for treatment. Nasal washings and  aspirates are unacceptable for Xpert Xpress SARS-CoV-2/FLU/RSV  testing.  Fact Sheet for  Patients: PinkCheek.be  Fact Sheet for Healthcare Providers: GravelBags.it  This test is not yet approved or cleared by the [place] FDA and  has been authorized for detection and/or diagnosis of SARS-CoV-2 by  FDA under an Emergency Use Authorization (EUA). This EUA will remain  in effect (meaning this test can be used) for the duration of the  Covid-19 declaration under Section 564(b)(1) of the Act, 21  U.S.C. section 360bbb-3(b)(1), unless the authorization is  terminated or revoked. Performed at [facility], [address] [place], [place] [address]   Culture, blood (routine x 2)     Status: None   Collection Time: 08/31/20 10:18 AM   Specimen: BLOOD  Result Value Ref Range Status   Specimen Description   Final    BLOOD BLOOD LEFT ARM Performed at [facility], [place] [address], [place], [place] [address]    Special Requests   Final    BOTTLES DRAWN AEROBIC AND ANAEROBIC Blood Culture adequate volume Performed at [facility], [place] [address] [place], [place] [address]    Culture   Final    NO GROWTH 5 DAYS Performed at [facility], [address] [place] [address] [place], [place] [address]    Report Status 09/05/2020 FINAL  Final  MRSA PCR Screening     Status: None   Collection Time: 08/31/20  6:27 PM   Specimen: Nasopharyngeal  Result Value Ref Range Status   MRSA by PCR NEGATIVE NEGATIVE Final    Comment:        The GeneXpert MRSA Assay (FDA approved for NASAL specimens only), is one component of a comprehensive MRSA colonization surveillance program. It is not intended to diagnose MRSA infection nor to guide or monitor treatment for MRSA infections. Performed at [facility], [place] [address] [place], [place] [address]   Culture, respiratory     Status:  None   Collection Time: 09/01/20 12:11 AM   Specimen: Tracheal Aspirate  Result Value Ref Range Status    Specimen Description TRACHEAL ASPIRATE  Final   Special Requests NONE  Final   Gram Stain   Final    FEW WBC PRESENT, PREDOMINANTLY PMN FEW GRAM NEGATIVE RODS RARE GRAM POSITIVE COCCI Performed at [facility], [place] [address] [place], [place] [address]    Culture MODERATE SERRATIA MARCESCENS  Final   Report Status 09/03/2020 FINAL  Final   Organism ID, Bacteria SERRATIA MARCESCENS  Final      Susceptibility   Serratia marcescens - MIC*    CEFAZOLIN >=64 RESISTANT Resistant     CEFEPIME <=0.12 SENSITIVE Sensitive     CEFTAZIDIME <=1 SENSITIVE Sensitive     CEFTRIAXONE <=0.25 SENSITIVE Sensitive     CIPROFLOXACIN <=0.25 SENSITIVE Sensitive     GENTAMICIN <=1 SENSITIVE Sensitive     TRIMETH/SULFA <=20 SENSITIVE Sensitive     * MODERATE SERRATIA MARCESCENS  Culture, blood (single)     Status: None   Collection Time: 09/01/20  2:55 AM   Specimen: BLOOD  Result Value Ref Range Status   Specimen Description BLOOD SITE NOT SPECIFIED  Final   Special Requests   Final    BOTTLES DRAWN AEROBIC AND ANAEROBIC Blood Culture adequate volume   Culture   Final    NO GROWTH 5 DAYS Performed at [facility], [address] [place], [place] [address]    Report Status 09/06/2020 FINAL  Final         Radiology Studies: No results found.      Scheduled Meds: . acetaminophen  650 mg Oral Once  . chlorhexidine  15 mL Mouth Rinse BID  . Chlorhexidine Gluconate Cloth  6 each Topical Daily  . docusate sodium  100 mg Oral Daily  . enoxaparin (LOVENOX) injection  70 mg Subcutaneous Q24H  . feeding supplement  237 mL Oral BID BM  . furosemide  40 mg Intravenous BID  . insulin aspart  0-20 Units Subcutaneous Q4H  . lactulose  10 g Oral TID  . mouth rinse  15 mL Mouth Rinse q12n4p  . methadone  30 mg Oral Daily  . pantoprazole sodium  40 mg Oral Q24H  . polyethylene glycol  17 g Oral Daily  . QUEtiapine  25 mg Oral QHS  . venlafaxine XR  225 mg Oral Q1200   Continuous  Infusions: . sodium chloride 10 mL/hr at 09/04/20 2125  . ceFEPime (MAXIPIME) IV 2 g (09/08/20 0552)     LOS: 8 days    Time spent: 25 mins.More than 50% of that time was spent in counseling and/or coordination of care.      Shelly Coss, MD [facility] P11/05/2020, 8:27 AM

## 2020-09-09 ENCOUNTER — Inpatient Hospital Stay (HOSPITAL_COMMUNITY): Payer: Medicaid Other

## 2020-09-09 LAB — BASIC METABOLIC PANEL
Anion gap: 9 (ref 5–15)
BUN: 11 mg/dL (ref 6–20)
CO2: 27 mmol/L (ref 22–32)
Calcium: 8.7 mg/dL — ABNORMAL LOW (ref 8.9–10.3)
Chloride: 106 mmol/L (ref 98–111)
Creatinine, Ser: 0.63 mg/dL (ref 0.44–1.00)
GFR, Estimated: >60 mL/min (ref >60)
Glucose, Bld: 125 mg/dL — ABNORMAL HIGH (ref 70–99)
Potassium: 4.3 mmol/L (ref 3.5–5.1)
Sodium: 142 mmol/L (ref 135–145)

## 2020-09-09 LAB — GLUCOSE, CAPILLARY
Glucose-Capillary: 121 mg/dL — ABNORMAL HIGH (ref 70–99)
Glucose-Capillary: 95 mg/dL (ref 70–99)
Glucose-Capillary: 119 mg/dL — ABNORMAL HIGH (ref 70–99)

## 2020-09-09 LAB — CBC
HCT: 30.8 % — ABNORMAL LOW (ref 36.0–46.0)
Hemoglobin: 9.6 g/dL — ABNORMAL LOW (ref 12.0–15.0)
MCH: 29.4 pg (ref 26.0–34.0)
MCHC: 31.2 g/dL (ref 30.0–36.0)
MCV: 94.5 fL (ref 80.0–100.0)
Platelets: 75 10*3/uL — ABNORMAL LOW (ref 150–400)
RBC: 3.26 MIL/uL — ABNORMAL LOW (ref 3.87–5.11)
RDW: 17.2 % — ABNORMAL HIGH (ref 11.5–15.5)
WBC: 3 10*3/uL — ABNORMAL LOW (ref 4.0–10.5)
nRBC: 0 % (ref 0.0–0.2)

## 2020-09-09 MED ORDER — LACTULOSE 10 GM/15ML PO SOLN
20.0000 g | Freq: Three times a day (TID) | ORAL | 10 refills | Status: DC
Start: 2020-09-09 — End: 2021-07-16

## 2020-09-09 MED ORDER — CLONAZEPAM 0.5 MG PO TABS: 0.5000 mg | ORAL_TABLET | Freq: Two times a day (BID) | ORAL | 2 refills | Status: DC | PRN

## 2020-09-09 MED ORDER — PANTOPRAZOLE SODIUM 40 MG PO TBEC
40.0000 mg | DELAYED_RELEASE_TABLET | Freq: Every day | ORAL | Status: DC
Start: 2020-09-09 — End: 2021-07-28

## 2020-09-09 MED ORDER — INSULIN ISOPHANE & REGULAR (HUMAN 70-30)100 UNIT/ML KWIKPEN: 20.0000 [IU] | PEN_INJECTOR | Freq: Two times a day (BID) | SUBCUTANEOUS | 6 refills | Status: DC

## 2020-09-09 NOTE — Progress Notes (Signed)
EEG Completed; Results Pending  

## 2020-09-09 NOTE — Procedures (Signed)
Patient Name: Jaime [name]  MRN: [id number]  Epilepsy Attending: Lora Havens  Referring Physician/Provider:  Dr. Lesleigh Noe Date: 09/09/2020 Duration: 25.12 mins  Patient history: 34yo F with ams. EEG to evaluate for seizure.  Level of alertness: awake  AEDs during EEG study: None  Technical aspects: This EEG study was done with scalp electrodes positioned according to the 10-20 International system of electrode placement. Electrical activity was acquired at a sampling rate of 500Hz  and reviewed with a high frequency filter of 70Hz  and a low frequency filter of 1Hz . EEG data were recorded continuously and digitally stored.   Description: The posterior dominant rhythm consists of 8 Hz activity of moderate voltage (25-35 uV) seen predominantly in posterior head regions, symmetric and reactive to eye opening and eye closing. EEG also showed continuous generalized high amplitude 5 to 7 Hz theta slowing with intermittent generalized 2 to 3 Hz delta slowing. Hyperventilation and photic stimulation were not performed.     ABNORMALITY -Continuous slow, generalized  IMPRESSION: This study is suggestive of mild diffuse encephalopathy, nonspecific etiology but likely related to sedation. No seizures or epileptiform discharges were seen throughout the recording.  EEG appears to be improving compared to previous study on 09/01/2020.  [name] [name]

## 2020-09-09 NOTE — TOC Progression Note (Signed)
Transition of Care [facility]) - Progression Note    Patient Details  Name: Jaime [name] MRN: [id number] Date of Birth: 09-23-1986  Transition of Care [facility]) CM/SW [place], RN Phone Number: [phone]  09/09/2020, 4:29 PM  Clinical Narrative:    CM unable to obtain home health. No accepting agency.        Expected Discharge Plan and Services           Expected Discharge Date: 09/09/20                                     Social Determinants of Health (SDOH) Interventions    Readmission Risk Interventions Readmission Risk Prevention Plan 07/31/2020  Transportation Screening Complete  Medication Review Press photographer) Complete  HRI or [place] Complete  SW Recovery Care/Counseling Consult Complete  [place] Not Applicable  Some recent data might be hidden

## 2020-09-09 NOTE — Progress Notes (Addendum)
Inpatient Diabetes Program Recommendations  AACE/ADA: New Consensus Statement on Inpatient Glycemic Control   Target Ranges:  Prepandial:   less than 140 mg/dL      Peak postprandial:   less than 180 mg/dL (1-2 hours)      Critically ill patients:  140 - 180 mg/dL   Results for ALZENA, GERBER (MRN [id number]) as of 09/09/2020 07:34  Ref. Range 09/08/2020 03:04 09/08/2020 07:46 09/08/2020 11:45 09/08/2020 15:50 09/08/2020 19:12 09/08/2020 23:04 09/09/2020 03:08  Glucose-Capillary Latest Ref Range: 70 - 99 mg/dL 127 (H)  Novolog 3 units 122 (H)  Novolog 3 units 145 (H)  Novolog 3 units 150 (H)  Novolog 3 units 153 (H)  Novolog 4 units 120 (H) 121 (H)  Novolog 3 units   Results for SHANARA, SCHNIEDERS (MRN [id number]) as of 09/09/2020 07:34  Ref. Range 08/31/2020 09:21  Glucose Latest Ref Range: 70 - 99 mg/dL 48 (L)  Results for JASIMINE, SIMMS (MRN [id number]) as of 09/09/2020 07:34  Ref. Range 07/29/2020 13:01 08/13/2020 05:38  Hemoglobin A1C Latest Ref Range: 4.8 - 5.6 % 8.0 (H) 7.1 (H)   Review of Glycemic Control  Diabetes history: DM2 Outpatient Diabetes medications: 70/30 90 units QAM, 70/30 80 units QPM, Metformin 1000 mg BID, Victoza 1.8 mg daily Current orders for Inpatient glycemic control: Novolog 0-20 units Q4H  Inpatient Diabetes Program Recommendations:    Insulin: Please consider ordering Levemir 10 units Q24H.  Outpatient DM regimen: May want to consider discharging on low dose Levemir and Novolog correction scale.  NOTE: Noted consult for diabetes coordinator. Chart reviewed. Noted patient sees Abbe Amsterdam, NP with [facility] and was last seen on 08/06/20. Per office visit note on 08/06/20 by Abbe Amsterdam, NP, patient was noted to be having lows at night and 70/30 was decreased to 90 units QAM, 80 units QPM, and continued on Metformin 1000 mg BID and Victoza 1.8 mg daily. Patient was initially hypoglycemic on 08/31/20 on day of presentation  to the hospital and found by family unresponsive with glucose in 30's.  Patient has only been ordered Novolog correction Q4H since hypoglycemia resolved. Over the past 24 hours, patient has received a total of 19 units of Novolog for correction. Would recommend ordering low dose basal insulin (Levemir 10 units Q24H). Patient is ordered Dys 2 diet but no meal intake charted so unclear if patient is eating well.  Anticipate patient will still need to be ordered insulin outpatient, but would recommend decreasing insulin dosages significantly. May want to consider discharging patient on similar insulin regimen as being used inpatient if glucose is fairly well controlled. Would also recommend patient follow up with Endocrinology for assistance with DM management.  Addendum 09/09/20@13 :30-Spoke with patient (grandfather at bedside) about DM and home regimen. Patient indicates she is still not able to talk but she nods head to answer questions and she indicates that she can write answers to some questions.  When asked about her home DM medication regimen, patient nods to indicate yes that she takes 70/30, Metformin, and Victoza. Asked patient to write dose of 70/30 and she wrote '70' and nods yes that she takes 70 units BID. Patient indicates that she takes 70/30 70 units BID consistently and she indicates that she does not skip dosages. Explained that she presented with hypoglycemia and it was noted that when she seen Abbe Amsterdam, NP on 08/06/20, she noted that patient had been having lows and 70/30 dose was decreased. Explained that since admitted, she  has only been getting Novolog insulin Q4H and that she will likely need adjustment to her outpatient DM regimen to prevent further issues with hypoglycemia. Asked patient to be sure to pay attention to discharge paperwork to see if DM medication regimen is changed and/or adjusted. Patient nods head to indicate that she understands information and has no questions at this  time.  Thanks, Barnie Alderman, RN, MSN, CDE Diabetes Coordinator Inpatient Diabetes Program [phone] (Team Pager from 8am to 5pm)

## 2020-09-09 NOTE — Progress Notes (Signed)
Occupational Therapy Treatment Patient Details Name: Jaime [name] MRN: [id number] DOB: [birth year] Today's Date: 09/09/2020    History of present illness 34 yo female admitted to ED on 10/30 for unresponsiveness, CBG 30. ETT 10/30-11/1, medical workup for acute metabolic vs toxic encephalopathy due to hypoglycemia, polypharmacy. Recent admission for sepsis of unknown PMH includes Hep C s/p treatment, NAFLD/NASH, Cirrhosis, IV drug abuse (abstained for 83yr), DM, MO, chronic dyspnea, HFpEF.   OT comments  Pt making steady progress towards OT goals this session. Pt continues to present with cognitive deficits ( continues to be non verbal although gesturing and nodding appropriately during session), impaired balance, decreased safety awareness and generalized weakness impacting pts ability to complete BADLs. Pt requried min guard assist to transition to EOB. Instructed pt to ambulate to sink for grooming tasks however pt impulsively stood and pivoted to recliner with RW and MIN A for balance and RW mgmt. Pt completed seated grooming tasks from recliner needing MIN A for verbal cues to initiate and sequence brushing teeth. Pt would continue to benefit from skilled occupational therapy while admitted and after d/c to address the below listed limitations in order to improve overall functional mobility and facilitate independence with BADL participation. DC plan remains appropriate, will follow acutely per POC.     Follow Up Recommendations  Home health OT;Supervision/Assistance - 24 hour    Equipment Recommendations  3 in 1 bedside commode    Recommendations for Other Services Speech consult;Other (comment) (psych consult)    Precautions / Restrictions Precautions Precautions: Fall Restrictions Weight Bearing Restrictions: No       Mobility Bed Mobility Overal bed mobility: Needs Assistance Bed Mobility: Supine to Sit     Supine to sit: HOB elevated;Min guard     General bed  mobility comments: light min guard to steady pt once sitting EOB  Transfers Overall transfer level: Needs assistance Equipment used: Rolling walker (2 wheeled) Transfers: Sit to/from [facility] to Stand: From elevated surface;Min assist Stand pivot transfers: Min assist       General transfer comment: MIN A from slightly elevated surface, pt prefer to pull up on RW needing assist to steady RW    Balance Overall balance assessment: Needs assistance Sitting-balance support: Feet supported Sitting balance-Leahy Scale: Fair     Standing balance support: Bilateral upper extremity supported Standing balance-Leahy Scale: Poor Standing balance comment: requires BUE support on RW during mobility                           ADL either performed or assessed with clinical judgement   ADL Overall ADL's : Needs assistance/impaired     Grooming: Wash/dry face;Oral care;Sitting;Cueing for sequencing;Supervision/safety;Set up;Minimal assistance Grooming Details (indicate cue type and reason): pt required 1 verbal cues to initiate oral care as well as s/u assist by placing paste on toothbrush. pt required cues to spit into dish during task, however pt able to wash face fromr recliner with just s/u assist   Upper Body Bathing Details (indicate cue type and reason): pt declined   Lower Body Bathing Details (indicate cue type and reason): pt declined   Upper Body Dressing Details (indicate cue type and reason): pt declined donning new gown     Toilet Transfer: Minimal assistance;Stand-pivot;RW Toilet Transfer Details (indicate cue type and reason): simulated via functional mobility from EOB<>recliner. cued pt to walk to sink however pt opted to pivot to recliner only  Functional mobility during ADLs: Minimal assistance;Cueing for safety;Cueing for sequencing;Rolling walker General ADL Comments: pt continues to present with cognitive deficits ( continues  to be non verbal), impaired balance, decreased safety awareness and generalized weakness impacting pts ability to complete BADLs     Vision       Perception     Praxis      Cognition Arousal/Alertness: Awake/alert;Lethargic (initially lethargic but arouses as session progresses) Behavior During Therapy: Flat affect Overall Cognitive Status: Impaired/Different from baseline Area of Impairment: Attention;Safety/judgement;Awareness;Problem solving;Following commands                   Current Attention Level: Selective   Following Commands: Follows one step commands consistently;Follows multi-step commands inconsistently;Follows one step commands with increased time Safety/Judgement: Decreased awareness of deficits;Decreased awareness of safety Awareness: Intellectual Problem Solving: Slow processing;Requires verbal cues;Decreased initiation;Difficulty sequencing General Comments: pt required cues to sequence and initiate burshing teeth, pt very flat but is able to follow one step commands with increased time and follows multistep commands inconsistently. pt continues to be nonverbal but appropriately nods throughout session. Pt flat but pleasant and engaged in session        Exercises     Shoulder Instructions       General Comments      Pertinent Vitals/ Pain       Pain Assessment: No/denies pain (shakes head "no" when asked)  Home Living                                          Prior Functioning/Environment              Frequency  Min 3X/week        Progress Toward Goals  OT Goals(current goals can now be found in the care plan section)  Progress towards OT goals: Progressing toward goals  Acute Rehab OT Goals Patient Stated Goal: nods yes to walking OT Goal Formulation: With family Time For Goal Achievement: 09/20/20 Potential to Achieve Goals: [facility] [facility] Discharge plan remains appropriate;Frequency remains appropriate     Co-evaluation                 AM-PAC OT "6 Clicks" Daily Activity     Outcome Measure   Help from another person eating meals?: A Lot Help from another person taking care of personal grooming?: A Little Help from another person toileting, which includes using toliet, bedpan, or urinal?: A Lot Help from another person bathing (including washing, rinsing, drying)?: A Lot Help from another person to put on and taking off regular upper body clothing?: A Lot Help from another person to put on and taking off regular lower body clothing?: A Lot 6 Click Score: 13    End of Session Equipment Utilized During Treatment: Rolling walker  OT Visit Diagnosis: Other abnormalities of gait and mobility (R26.89);Muscle weakness (generalized) (M62.81);Other symptoms and signs involving cognitive function;Cognitive communication deficit (R41.841);Pain   Activity Tolerance Patient tolerated treatment well   Patient Left in chair;with call bell/phone within reach;with chair alarm set   Nurse Communication Mobility status        Time: 0737-1062 OT Time Calculation (min): 15 min  Charges: OT General Charges $OT Visit: 1 Visit OT Treatments $Self Care/Home Management : 8-22 mins   Lanier Clam., COTA/L Acute Rehabilitation Services [phone]    Ihor Gully 09/09/2020, 8:53 AM

## 2020-09-09 NOTE — Progress Notes (Signed)
Neurology Progress Note  S: AMS  O: Brief progress note and chart review of EEG.  Current vital signs: BP 119/62 (BP Location: Left Arm)   Pulse 95   Temp 99.3 F (37.4 C)   Resp 18   Ht 5' 9"  (1.753 m)   Wt 133.9 kg   SpO2 94%   BMI 43.59 kg/m  Vital signs in last 24 hours: Temp:  [98.8 F (37.1 C)-99.3 F (37.4 C)] 99.3 F (37.4 C) (11/08 1415) Pulse Rate:  [92-95] 95 (11/08 1415) Resp:  [18-20] 18 (11/08 1415) BP: (119-132)/(62-87) 119/62 (11/08 1415) SpO2:  [94 %-99 %] 94 % (11/08 1415) Weight:  [133.9 kg] 133.9 kg (11/08 0326)  Medications  Current Facility-Administered Medications:  .  0.9 %  sodium chloride infusion, , Intravenous, Continuous, Erick Colace, NP, Last Rate: 10 mL/hr at 09/04/20 2125, New Bag at 09/04/20 2125 .  acetaminophen (TYLENOL) tablet 650 mg, 650 mg, Oral, Once, Brimage, Vondra, DO .  chlorhexidine (PERIDEX) 0.12 % solution 15 mL, 15 mL, Mouth Rinse, BID, Spero Geralds, MD, 15 mL at 09/09/20 1012 .  Chlorhexidine Gluconate Cloth 2 % PADS 6 each, 6 each, Topical, Daily, [place], Shona Needles, MD, 6 each at 09/09/20 1016 .  docusate sodium (COLACE) capsule 100 mg, 100 mg, Oral, Daily, Spero Geralds, MD, 100 mg at 09/09/20 1013 .  enoxaparin (LOVENOX) injection 70 mg, 70 mg, Subcutaneous, Q24H, Adhikari, Amrit, MD, 70 mg at 09/09/20 1012 .  feeding supplement (ENSURE ENLIVE / ENSURE PLUS) liquid 237 mL, 237 mL, Oral, BID BM, Spero Geralds, MD, 237 mL at 09/09/20 1358 .  furosemide (LASIX) tablet 40 mg, 40 mg, Oral, BID, Adhikari, Amrit, MD, 40 mg at 09/09/20 1013 .  insulin aspart (novoLOG) injection 0-20 Units, 0-20 Units, Subcutaneous, Q4H, Chesley Mires, MD, 3 Units at 09/09/20 0324 .  lactulose (CHRONULAC) 10 GM/15ML solution 10 g, 10 g, Oral, TID, Adhikari, Amrit, MD, 10 g at 09/09/20 1013 .  MEDLINE mouth rinse, 15 mL, Mouth Rinse, q12n4p, Spero Geralds, MD, 15 mL at 09/09/20 1246 .  methadone (DOLOPHINE) tablet 30 mg, 30 mg, Oral,  Daily, Spero Geralds, MD, 30 mg at 09/09/20 1014 .  pantoprazole sodium (PROTONIX) 40 mg/20 mL oral suspension 40 mg, 40 mg, Oral, Q24H, Romilda Garret, RPH, 40 mg at 09/09/20 1013 .  polyethylene glycol (MIRALAX / GLYCOLAX) packet 17 g, 17 g, Oral, Daily, Spero Geralds, MD, 17 g at 09/09/20 1016 .  QUEtiapine (SEROQUEL) tablet 25 mg, 25 mg, Oral, QHS, Magdalen Spatz, NP, 25 mg at 09/08/20 2215 .  venlafaxine XR (EFFEXOR-XR) 24 hr capsule 225 mg, 225 mg, Oral, Q1200, Magdalen Spatz, NP, 225 mg at 09/09/20 1310 Labs CBC    Component Value Date/Time   WBC 3.0 (L) 09/09/2020 0226   RBC 3.26 (L) 09/09/2020 0226   HGB 9.6 (L) 09/09/2020 0226   HCT 30.8 (L) 09/09/2020 0226   PLT 75 (L) 09/09/2020 0226   MCV 94.5 09/09/2020 0226   MCH 29.4 09/09/2020 0226   MCHC 31.2 09/09/2020 0226   RDW 17.2 (H) 09/09/2020 0226   LYMPHSABS 0.7 08/31/2020 0921   MONOABS 0.7 08/31/2020 0921   EOSABS 0.1 08/31/2020 0921   BASOSABS 0.1 08/31/2020 0921    CMP     Component Value Date/Time   NA 142 09/09/2020 0226   NA 143 07/23/2020 0842   K 4.3 09/09/2020 0226   CL 106 09/09/2020 0226   CO2 27  09/09/2020 0226   GLUCOSE 125 (H) 09/09/2020 0226   BUN 11 09/09/2020 0226   BUN 8 07/23/2020 0842   CREATININE 0.63 09/09/2020 0226   CALCIUM 8.7 (L) 09/09/2020 0226   PROT 6.1 (L) 09/04/2020 0110   PROT 6.3 07/23/2020 0842   ALBUMIN 2.4 (L) 09/04/2020 0110   ALBUMIN 3.3 (L) 07/23/2020 0842   AST 46 (H) 09/04/2020 0110   ALT 31 09/04/2020 0110   ALKPHOS 82 09/04/2020 0110   BILITOT 0.6 09/04/2020 0110   BILITOT 0.5 07/23/2020 0842   GFRNONAA >60 09/09/2020 0226   GFRAA >60 08/01/2020 0704    glycosylated hemoglobin  Lipid Panel     Component Value Date/Time   CHOL 210 (H) 07/23/2020 0842   TRIG 86 08/12/2020 1023   HDL 60 07/23/2020 0842   CHOLHDL 3.5 07/23/2020 0842   LDLCALC 116 (H) 07/23/2020 0842     Imaging I have reviewed images in epic and the results pertinent to this  consultation are: EEG  Assessment: 34 yo female with 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  Impression: EEG without epileptic changes. Suggest volitional behavior.   Recommendations: No further recommendations over original consult note. No need for anti seizure medications at this time. Call with questions.    Clance Boll, MSN, APN-BC/Nurse Practitioner Pager: [phone]

## 2020-09-09 NOTE — Discharge Summary (Signed)
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 She was also found to be hypoglycemic. She did not respond to Narcan or glucose. She was intubated for airway protection and was sent to [facility] from [place]. Currently her respiratory status is stable, status post extubation. Patient transferred to [facility] service on 09/05/20.  Hospital course remarkable for encephalopathy with slow improvement and aphagia.Neurology consulted but cleared for discharge by attributing to underlying psychiatric issues.  She has remained hemodynamically stable for discharge today.  She needs to follow-up with psychiatry as an outpatient.  Following problems were addressed during hospitalization:  Acute metabolic encephalopathy/substance abuse: Thought to be secondary to hypoglycemia, polypharmacy. 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 MRI of the brain did not show any acute intracranial abnormalities. None also in CT.She was taking Klonopin, Flexeril, Lyrica which have are on hold. Resumed home Seroquel, Effexor and methadone.  Mildly elevated ammonia level. LTM EEG showed severe diffuse encephalopathy likely related to sedation, no epileptiform discharges. Her mental status has improved.  Follows commands but does not talk and she is alert and awake and follows commands. We requested psychiatry consultation.  Psychiatry recommended to continue current medication with outpatient follow-up. Neurology consulted and  attributed her aphasia to underlying psychiatric issues.  Acute hypoxic respiratory failure: She was intubated for airway protection. Hospital course remarkable for aspiration pneumonitis. Currently on room air.  Aspiration pneumonitis: Sputum culture positive for Serratia marcescens.Treated with cefipime  UTI: Urine culture positive for Enterococcus faecalis. Treated with  ampicillin.  Acute on chronic diastolic congestive heart failure: Appeared volume overloaded on presentation. Takes Lasix at home. Continue current Lasix regimen. Also taking lisinopril, Crestor and Aldactone at home.  Cirrhosis/NASH/hepatitis C: She says she has been treated for hepatitis C. 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 She has chronic anasarca/ascites. Follow-up ammonia level. Continue lactulose. liver enzymes are almost normal.  Anasarca improved with IV Lasix  Diabetes type 2: On Victoza, Metformin,insulin at home. Continue sliding scale insulin here. Her recent hemoglobin A1c is 7.1. She is on extremely high-dose of insulin at home.  We have reduce the dose of insulin on discharge.  She is to monitor her blood glucose very well at home.  She actually presented with hypoglycemia.  She needs to follow-up with her endocrinologist in a week  Pancytopenia: Likely associated with chronic liver disease/chronic medical problems. Also has thrombocytopenia in the setting of cirrhosis. Continue to monitor CBC as an outpatient.  Hypokalemia: Monitored and supplemented   Morbid obesity: BMI of 46.9  Debility/deconditioning:  PT/OT evaluation done and recommended HH   Discharge Diagnoses:  Principal Problem:   Adjustment disorder with depressed mood Active Problems:   Pressure injury of skin   Respiratory failure ([place])   Acute encephalopathy    Discharge Instructions  Discharge Instructions    Diet - low sodium heart healthy   Complete by: As directed    Dysphagia 2 diet   Discharge  instructions   Complete by: As directed    1)Please follow-up with your PCP in a week 2)Follow  up with psychiatry in a week.  Name and number of the provider has been attached. 3)Take prescribed medications as instructed.Follow up with home health 4)Monitor your blood sugars at home.  Follow-up with your endocrinologist in 1 to 2 weeks   Increase activity slowly   Complete by: As directed    No wound care   Complete by: As directed      Allergies as of 09/09/2020      Reactions   Iodine-131 Anaphylaxis, Shortness Of Breath, Swelling   Ivp Dye [iodinated Diagnostic Agents] Anaphylaxis   Skin gets very red, unable to walk   Ketorolac Tromethamine Shortness Of Breath   Tylenol [acetaminophen] Other (See Comments)   Due to cirrhosis   Gabapentin    Morphine And Related    Pt says she is not allergic to Morphine 08/12/20   Nsaids Other (See Comments)   Flares ulcers   Suboxone [buprenorphine Hcl-naloxone Hcl]    Vancomycin Swelling   Facial swelling,       Medication List    STOP taking these medications   cyclobenzaprine 10 MG tablet Commonly known as: FLEXERIL   dicyclomine 10 MG capsule Commonly known as: BENTYL   docusate sodium 100 MG capsule Commonly known as: COLACE   fluconazole 200 MG tablet Commonly known as: DIFLUCAN   fluticasone 50 MCG/ACT nasal spray Commonly known as: FLONASE   psyllium 0.52 g capsule Commonly known as: REGULOID     TAKE these medications   Accu-Chek Guide test strip Generic drug: glucose blood Use as instructed to check blood glucose four times daily   Accu-Chek Softclix Lancets lancets 4 (four) times daily.   albuterol (2.5 MG/3ML) 0.083% nebulizer solution Commonly known as: PROVENTIL Take 2.5 mg by nebulization every 6 (six) hours as needed for wheezing or shortness of breath.   albuterol 0.63 MG/3ML nebulizer solution Commonly known as: ACCUNEB Take by nebulization 4 (four) times daily.   aspirin EC 81 MG tablet Take  162 mg by mouth daily. Swallow whole.   cetirizine 10 MG tablet Commonly known as: ZyrTEC Allergy Take 1 tablet (10 mg total) by mouth daily.   clonazePAM 0.5 MG tablet Commonly known as: KlonoPIN Take 1 tablet (0.5 mg total) by mouth 2 (two) times daily as needed for anxiety. What changed:   when to take this  reasons to take this   Dexcom G6 Sensor Misc 4 Pieces by Does not apply route once a week.   Dexcom G6 Transmitter Misc 1 Piece by Does not apply route as directed.   furosemide 40 MG tablet Commonly known as: LASIX Take 80 mg by mouth daily.   hyoscyamine 0.125 MG Tbdp disintergrating tablet Commonly known as: ANASPAZ Place 0.125 mg under the tongue 3 (three) times daily as needed for bladder spasms or cramping (Patient Preference).   insulin isophane & regular human (70-30) 100 UNIT/ML KwikPen Commonly known as: HUMULIN 70/30 MIX Inject 20 Units into the skin 2 (two) times daily. Inject 90 units with breakfast and 80 units with supper What changed:   how much to take  how to take this  when to take this   lactulose 10 GM/15ML solution Commonly known as: CHRONULAC Take 30 mLs (20 g total) by mouth 3 (three) times daily.   lisinopril 5 MG tablet Commonly known as: ZESTRIL Take 1 tablet (5 mg total) by mouth daily.   metFORMIN 1000 MG tablet Commonly known as: GLUCOPHAGE Take 1 tablet (1,000 mg total) by mouth 2 (two) times  daily with a meal.   methadone 10 MG/5ML solution Commonly known as: DOLOPHINE Take 30 mg by mouth every morning.   multivitamin tablet Take 1 tablet by mouth daily.   Nexplanon 68 MG Impl implant Generic drug: etonogestrel 1 each by Subdermal route once.   nystatin 100000 UNIT/ML suspension Commonly known as: MYCOSTATIN Take 5 mLs (500,000 Units total) by mouth 4 (four) times daily.   ondansetron 4 MG disintegrating tablet Commonly known as: ZOFRAN-ODT Take 4 mg by mouth every 8 (eight) hours as needed for nausea.    pantoprazole 40 MG tablet Commonly known as: PROTONIX Take 1 tablet (40 mg total) by mouth daily. What changed: when to take this   Pen Needles 32G X 6 MM Misc 1 each by Does not apply route 3 (three) times daily.   potassium chloride 10 MEQ tablet Commonly known as: KLOR-CON Take 20 mEq by mouth daily.   pregabalin 75 MG capsule Commonly known as: LYRICA Take 75 mg by mouth in the morning, at noon, in the evening, and at bedtime.   propranolol 10 MG tablet Commonly known as: INDERAL Take 10 mg by mouth 3 (three) times daily.   QUEtiapine 25 MG tablet Commonly known as: SEROQUEL Take 1 tablet (25 mg total) by mouth at bedtime. Start taking on: October 16, 2020   Restasis 0.05 % ophthalmic emulsion Generic drug: cycloSPORINE Place 1 drop into both eyes as needed (dry eye).   rosuvastatin 5 MG tablet Commonly known as: Crestor Take 1 tablet (5 mg total) by mouth daily.   spironolactone 100 MG tablet Commonly known as: ALDACTONE Take 100 mg by mouth daily.   sucralfate 1 g tablet Commonly known as: CARAFATE Take 1 g by mouth 4 (four) times daily.   venlafaxine XR 75 MG 24 hr capsule Commonly known as: EFFEXOR-XR Take 75 mg by mouth daily. To take along with 162m for a total of 225 mg daily   venlafaxine XR 150 MG 24 hr capsule Commonly known as: EFFEXOR-XR Take total of 225 mg daily (150 mg + 75 mg ) Start taking on: October 17, 2020   Victoza 18 MG/3ML Sopn Generic drug: liraglutide Inject 1.8 mg into the skin daily.       Follow-up Information    HNeale Burly MD. Schedule an appointment as soon as possible for a visit in 1 week(s).   Specialty: Internal Medicine Contact information: [phone]        HNorman Clay MD. Schedule an appointment as soon as possible for a visit in 1 week(s).   Specialty: Psychiatry Contact information: [address] [place] [phone]              Allergies  Allergen Reactions  . Iodine-131 Anaphylaxis, Shortness Of Breath and Swelling  . Ivp Dye [Iodinated Diagnostic Agents] Anaphylaxis    Skin gets very red, unable to walk   . Ketorolac Tromethamine Shortness Of Breath  . Tylenol [Acetaminophen] Other (See Comments)    Due to cirrhosis   . Gabapentin   . Morphine And Related     Pt says she is not allergic to Morphine 08/12/20  . Nsaids Other (See Comments)    Flares ulcers  . Suboxone [Buprenorphine Hcl-Naloxone Hcl]   . Vancomycin Swelling    Facial swelling,     Consultations:  PCCM, neurology, psychiatry   Procedures/Studies: EEG  Result Date: 09/09/2020 YLora Havens MD  09/09/2020 12:55 PM Patient Name: Jaime [name] MRN: [id number] Epilepsy Attending: Lora Havens Referring Physician/Provider:  Dr. Lesleigh Noe Date: 09/09/2020 Duration: 25.12 mins  Patient history: 34yo F with ams. EEG to evaluate for seizure.  Level of alertness: awake  AEDs during EEG study: None Technical aspects: This EEG study was done with scalp electrodes positioned according to the 10-20 International system of electrode placement. Electrical activity was acquired at a sampling rate of 500Hz  and reviewed with a high frequency filter of 70Hz  and a low frequency filter of 1Hz . EEG data were recorded continuously and digitally stored.  Description: The posterior dominant rhythm consists of 8 Hz activity of moderate voltage (25-35 uV) seen predominantly in posterior head regions, symmetric and reactive to eye opening and eye closing. EEG also showed continuous generalized high amplitude 5 to 7 Hz theta slowing with intermittent generalized 2 to 3 Hz delta slowing. Hyperventilation and photic stimulation were not performed.    ABNORMALITY -Continuous slow, generalized  IMPRESSION: This study is suggestive of mild diffuse encephalopathy, nonspecific etiology but likely related to sedation. No seizures or epileptiform  discharges were seen throughout the recording. EEG appears to be improving compared to previous study on 09/01/2020.  Lora Havens   CT Head Wo Contrast  Result Date: 08/31/2020 CLINICAL DATA:  Delirium.  Patient found unresponsive by family EXAM: CT HEAD WITHOUT CONTRAST TECHNIQUE: Contiguous axial images were obtained from the base of the skull through the vertex without intravenous contrast. COMPARISON:  08/24/2019 FINDINGS: Brain: No evidence of acute infarction, hemorrhage, hydrocephalus, extra-axial collection or mass lesion/mass effect. Vascular: No hyperdense vessel or unexpected calcification. Skull: Normal. Negative for fracture or focal lesion. Sinuses/Orbits: LEFT mastoid effusion. Other: Study quality is degraded by patient motion artifact. IMPRESSION: 1. No evidence for acute intracranial abnormality. 2. LEFT mastoid effusion. Electronically Signed   By: Nolon Nations M.D.   On: 08/31/2020 11:09   MR BRAIN WO CONTRAST  Result Date: 09/01/2020 CLINICAL DATA:  Mental status change, persistent or worsening. EXAM: MRI HEAD WITHOUT CONTRAST TECHNIQUE: Multiplanar, multiecho pulse sequences of the brain and surrounding structures were obtained without intravenous contrast. COMPARISON:  08/31/2020 head CT and prior.  05/15/2004 MRI head. FINDINGS: Brain: No diffusion-weighted signal abnormality. No intracranial hemorrhage. No midline shift, ventriculomegaly or extra-axial fluid collection. No mass lesion. Vascular: Normal flow voids. Skull and upper cervical spine: Normal marrow signal. Sinuses/Orbits: Normal orbits. Clear paranasal sinuses. Left mastoid effusion. Other: None. IMPRESSION: No acute intracranial process. Left mastoid effusion. Electronically Signed   By: Primitivo Gauze M.D.   On: 09/01/2020 17:48   NM Pulmonary Perfusion  Result Date: 08/12/2020 CLINICAL DATA:  34 year old female with positive D-dimer. EXAM: NUCLEAR MEDICINE PERFUSION LUNG SCAN TECHNIQUE: Perfusion  images were obtained in multiple projections after intravenous injection of radiopharmaceutical. Ventilation scans intentionally deferred if perfusion scan and chest x-ray adequate for interpretation during COVID 19 epidemic. RADIOPHARMACEUTICALS:  4.4 mCi Tc-37mMAA IV COMPARISON:  Chest radiograph dated 08/12/2020. FINDINGS: There is homogeneous perfusion.  No perfusion defect identified. IMPRESSION: Normal perfusion scan. Electronically Signed   By: AAnner CreteM.D.   On: 08/12/2020 16:11   DG CHEST PORT 1 VIEW  Result Date: 09/04/2020 CLINICAL DATA:  Abnormal respiration EXAM: PORTABLE CHEST 1 VIEW COMPARISON:  2 days ago FINDINGS: Aeration has improved from before. Extubation with stable or improved lung volumes. Cardiomegaly is unchanged. No effusion or pneumothorax IMPRESSION: Interval extubation with improved aeration. Electronically Signed   By: JNeva SeatD.  On: 09/04/2020 06:47   DG Chest Port 1 View  Result Date: 09/02/2020 CLINICAL DATA:  Respiratory failure EXAM: PORTABLE CHEST 1 VIEW COMPARISON:  09/01/2030 FINDINGS: ET tube tip is above the carina. There is a nasogastric tube within the stomach. Cardiac enlargement, unchanged. Decreased lung volumes. Bibasilar opacities are unchanged from previous exam. No signs of pleural effusion or interstitial edema. IMPRESSION: 1. No change in aeration to the lungs compared with previous exam. 2. Stable support apparatus. 3. Cardiac enlargement. Electronically Signed   By: Kerby Moors M.D.   On: 09/02/2020 07:25   DG Chest Port 1 View  Result Date: 09/01/2020 CLINICAL DATA:  Acute respiratory failure.  Follow-up exam. EXAM: PORTABLE CHEST 1 VIEW COMPARISON:  08/31/2020 and earlier exams. FINDINGS: Endotracheal tube and nasal/orogastric tube are well positioned. There interstitial and hazy airspace opacities at the lung bases. Overall lung aeration has improved from the previous day's exam with the mid to upper lungs relatively  clear. Lung volumes remain low. No definite pleural effusion and no pneumothorax. IMPRESSION: 1. Improved lung aeration compared to the previous day's exam. There are persistent interstitial and hazy airspace opacities in the lower lungs which may reflect atelectasis, residual edema or a combination. Infection should be considered if there are consistent clinical findings. 2. Stable well-positioned support apparatus. Electronically Signed   By: Lajean Manes M.D.   On: 09/01/2020 06:42   DG Chest Portable 1 View  Result Date: 08/31/2020 CLINICAL DATA:  34 year old with a history of unresponsiveness EXAM: PORTABLE CHEST 1 VIEW COMPARISON:  08/17/2019 FINDINGS: Low lung volumes. Cardiac diameter and cardiomediastinal silhouette projects enlarged. Endotracheal tube terminates 4.7 cm above the carina. Gastric tube projects over the mediastinum terminating out of the field of view. Patchy opacities bilateral lungs partially obscuring the heart borders and hemidiaphragm. Interlobular septal thickening. IMPRESSION: Low lung volumes with interlobular septal thickening and patchy opacities, with the differential including edema, multifocal pneumonia, and/or sequela of aspiration/aspiration pneumonitis. Endotracheal tube terminates suitably above the carina. Gastric tube in place. Electronically Signed   By: Corrie Mckusick D.O.   On: 08/31/2020 10:36   DG CHEST PORT 1 VIEW  Result Date: 08/16/2020 CLINICAL DATA:  Cough, shortness of breath, negative COVID test EXAM: PORTABLE CHEST 1 VIEW COMPARISON:  08/12/2020 FINDINGS: Mildly prominent interstitial markings. Mild patchy right lower lobe opacity. No pleural effusion or pneumothorax. The heart is normal in size. IMPRESSION: Mild patchy right lower lobe opacity, suspicious for pneumonia. Electronically Signed   By: Julian Hy M.D.   On: 08/16/2020 07:48   DG Chest Port 1 View  Result Date: 08/12/2020 CLINICAL DATA:  Shortness of breath EXAM: PORTABLE CHEST  1 VIEW COMPARISON:  July 29, 2020 chest radiograph and chest CT FINDINGS: There is bibasilar atelectasis. No edema or airspace opacity. Heart is mildly enlarged with pulmonary vascularity normal. No adenopathy. No bone lesions. IMPRESSION: Mild bibasilar atelectasis. Lungs otherwise clear. Mild cardiac enlargement. No adenopathy. Electronically Signed   By: Lowella Grip III M.D.   On: 08/12/2020 10:54   EEG adult  Result Date: 08/31/2020 Lora Havens, MD     08/31/2020  8:52 PM Patient Name: Jaime [name] MRN: [id number] Epilepsy Attending: Lora Havens Referring Physician/Provider: Salvadore Dom, NP Date: 08/31/2020 Duration: 25.12 mins Patient history: 34yo F with ams. EEG to evaluate for seizure. Level of alertness: comatose AEDs during EEG study: Propofol, Versed Technical aspects: This EEG study was done with scalp electrodes positioned according to the 10-20 International system of  electrode placement. Electrical activity was acquired at a sampling rate of 500Hz  and reviewed with a high frequency filter of 70Hz  and a low frequency filter of 1Hz . EEG data were recorded continuously and digitally stored. Description: EEG showed continuous generalized 3 to 6 Hz theta-delta slowing admixed with generalized 13-15Hz  beta activity.  Hyperventilation and photic stimulation were not performed.   ABNORMALITY -Continuous slow, generalized IMPRESSION: This study is suggestive of severe diffuse encephalopathy, nonspecific etiology but likely related to sedation. No seizures or epileptiform discharges were seen throughout the recording. Priyanka [name]   Overnight EEG with video  Result Date: 09/01/2020 Lora Havens, MD     09/02/2020  9:40 AM Patient Name: Jaime [name] MRN: [id number] Epilepsy Attending: Lora Havens Referring Physician/Provider: Salvadore Dom, NP Duration: 08/31/2020 2112 to 09/01/2020 1540  Patient history: 34yo F with ams. EEG to evaluate for seizure.   Level of alertness: comatose  AEDs during EEG study: Propofol  Technical aspects: This EEG study was done with scalp electrodes positioned according to the 10-20 International system of electrode placement. Electrical activity was acquired at a sampling rate of 500Hz  and reviewed with a high frequency filter of 70Hz  and a low fre quency filter of 1Hz . EEG data were recorded continuously and digitally stored.  Description: EEG showed continuous generalized 3 to 6 Hz theta-delta slowing which at times appears sharply contoured admixed with generalized 13-15Hz  beta activity.  Hyperventilation and photic stimulation were not performed.    ABNORMALITY -Continuous slow, generalized  IMPRESSION: This study is suggestive of severe diffuse encephalopathy, nonspecific etiology but likely related to sedation. No seizures ordefinite  epileptiform discharges were seen throughout the recording.  Priyanka [name]      Subjective: Patient seen and examined at the bedside this morning.  Sitting on the chair.  Alert, awake, follows commands but does not speak.  Comfortable  Discharge Exam: Vitals:   09/08/20 2045 09/09/20 0524  BP: 132/81 129/87  Pulse: 92 93  Resp: 18 20  Temp: 98.9 F (37.2 C) 98.8 F (37.1 C)  SpO2: 99% 96%   Vitals:   09/08/20 1408 09/08/20 2045 09/09/20 0326 09/09/20 0524  BP: 138/79 132/81  129/87  Pulse: (!) 107 92  93  Resp: 18 18  20   Temp: 98.2 F (36.8 C) 98.9 F (37.2 C)  98.8 F (37.1 C)  TempSrc:  Oral  Oral  SpO2: 92% 99%  96%  Weight:   133.9 kg   Height:        General: Pt is alert, awake, not in acute distress Cardiovascular: RRR, S1/S2 +, no rubs, no gallops Respiratory: CTA bilaterally, no wheezing, no rhonchi Abdominal: Soft, NT, ND, bowel sounds + Extremities: improving anasarca, no cyanosis    The results of significant diagnostics from this hospitalization (including imaging, microbiology, ancillary and laboratory) are listed below for  reference.     Microbiology: Recent Results (from the past 240 hour(s))  Urine culture     Status: Abnormal   Collection Time: 08/31/20  9:21 AM   Specimen: Urine, Clean Catch  Result Value Ref Range Status   Specimen Description   Final    URINE, CLEAN CATCH Performed at [facility], [address], [place], [place] [address]    Special Requests   Final    NONE Performed at [facility], [address] [place], [place] [address]    Culture >=100,000 COLONIES/mL ENTEROCOCCUS FAECALIS (A)  Final   Report Status 09/03/2020 FINAL  Final  Organism ID, Bacteria ENTEROCOCCUS FAECALIS (A)  Final      Susceptibility   Enterococcus faecalis - MIC*    AMPICILLIN <=2 SENSITIVE Sensitive     NITROFURANTOIN <=16 SENSITIVE Sensitive     VANCOMYCIN 1 SENSITIVE Sensitive     * >=100,000 COLONIES/mL ENTEROCOCCUS FAECALIS  Respiratory Panel by RT PCR (Flu A&B, Covid) - Nasopharyngeal Swab     Status: None   Collection Time: 08/31/20  9:34 AM   Specimen: Nasopharyngeal Swab  Result Value Ref Range Status   SARS Coronavirus 2 by RT PCR NEGATIVE NEGATIVE Final    Comment: (NOTE) SARS-CoV-2 target nucleic acids are NOT DETECTED.  The SARS-CoV-2 RNA is generally detectable in upper respiratoy specimens during the acute phase of infection. The lowest concentration of SARS-CoV-2 viral copies this assay can detect is 131 copies/mL. A negative result does not preclude SARS-Cov-2 infection and should not be used as the sole basis for treatment or other patient management decisions. A negative result may occur with  improper specimen collection/handling, submission of specimen other than nasopharyngeal swab, presence of viral mutation(s) within the areas targeted by this assay, and inadequate number of viral copies (<131 copies/mL). A negative result must be combined with clinical observations, patient history, and epidemiological information. The expected result is Negative.  Fact Sheet for  Patients:  PinkCheek.be  Fact Sheet for Healthcare Providers:  GravelBags.it  This test is no t yet approved or cleared by the [place] FDA and  has been authorized for detection and/or diagnosis of SARS-CoV-2 by FDA under an Emergency Use Authorization (EUA). This EUA will remain  in effect (meaning this test can be used) for the duration of the COVID-19 declaration under Section 564(b)(1) of the Act, 21 U.S.C. section 360bbb-3(b)(1), unless the authorization is terminated or revoked sooner.     Influenza A by PCR NEGATIVE NEGATIVE Final   Influenza B by PCR NEGATIVE NEGATIVE Final    Comment: (NOTE) The Xpert Xpress SARS-CoV-2/FLU/RSV assay is intended as an aid in  the diagnosis of influenza from Nasopharyngeal swab specimens and  should not be used as a sole basis for treatment. Nasal washings and  aspirates are unacceptable for Xpert Xpress SARS-CoV-2/FLU/RSV  testing.  Fact Sheet for Patients: PinkCheek.be  Fact Sheet for Healthcare Providers: GravelBags.it  This test is not yet approved or cleared by the [place] FDA and  has been authorized for detection and/or diagnosis of SARS-CoV-2 by  FDA under an Emergency Use Authorization (EUA). This EUA will remain  in effect (meaning this test can be used) for the duration of the  Covid-19 declaration under Section 564(b)(1) of the Act, 21  U.S.C. section 360bbb-3(b)(1), unless the authorization is  terminated or revoked. Performed at [facility], [address], [place], [place] [address]   Culture, blood (routine x 2)     Status: None   Collection Time: 08/31/20 10:18 AM   Specimen: BLOOD  Result Value Ref Range Status   Specimen Description   Final    BLOOD BLOOD LEFT ARM Performed at [facility], [place] [address] [place], [place] [address]    Special Requests   Final     BOTTLES DRAWN AEROBIC AND ANAEROBIC Blood Culture adequate volume Performed at [facility], [place] [address], [place], [place] [address]    Culture   Final    NO GROWTH 5 DAYS Performed at [facility], [address], [place], [place] [address]    Report  Status 09/05/2020 FINAL  Final  MRSA PCR Screening     Status: None   Collection Time: 08/31/20  6:27 PM   Specimen: Nasopharyngeal  Result Value Ref Range Status   MRSA by PCR NEGATIVE NEGATIVE Final    Comment:        The GeneXpert MRSA Assay (FDA approved for NASAL specimens only), is one component of a comprehensive MRSA colonization surveillance program. It is not intended to diagnose MRSA infection nor to guide or monitor treatment for MRSA infections. Performed at [facility], [place] [address] [place], [place] [address]   Culture, respiratory     Status: None   Collection Time: 09/01/20 12:11 AM   Specimen: Tracheal Aspirate  Result Value Ref Range Status   Specimen Description TRACHEAL ASPIRATE  Final   Special Requests NONE  Final   Gram Stain   Final    FEW WBC PRESENT, PREDOMINANTLY PMN FEW GRAM NEGATIVE RODS RARE GRAM POSITIVE COCCI Performed at [facility], [place] [address], [place], [place] [address]    Culture MODERATE SERRATIA MARCESCENS  Final   Report Status 09/03/2020 FINAL  Final   Organism ID, Bacteria SERRATIA MARCESCENS  Final      Susceptibility   Serratia marcescens - MIC*    CEFAZOLIN >=64 RESISTANT Resistant     CEFEPIME <=0.12 SENSITIVE Sensitive     CEFTAZIDIME <=1 SENSITIVE Sensitive     CEFTRIAXONE <=0.25 SENSITIVE Sensitive     CIPROFLOXACIN <=0.25 SENSITIVE Sensitive     GENTAMICIN <=1 SENSITIVE Sensitive     TRIMETH/SULFA <=20 SENSITIVE Sensitive     * MODERATE SERRATIA MARCESCENS  Culture, blood (single)     Status: None   Collection Time: 09/01/20  2:55 AM   Specimen: BLOOD  Result Value Ref Range Status   Specimen Description BLOOD SITE NOT  SPECIFIED  Final   Special Requests   Final    BOTTLES DRAWN AEROBIC AND ANAEROBIC Blood Culture adequate volume   Culture   Final    NO GROWTH 5 DAYS Performed at [facility], [address], [place], [place] [address]    Report Status 09/06/2020 FINAL  Final     Labs: BNP (last 3 results) Recent Labs    07/29/20 1301 08/31/20 0916  BNP 26.0 416.6*   Basic Metabolic Panel: Recent Labs  Lab 09/04/20 0110 09/04/20 0110 09/05/20 0027 09/06/20 0337 09/07/20 0450 09/08/20 0910 09/09/20 0226  NA 139   < > 141 142 142 143 142  K 3.7   < > 3.3* 3.3* 3.4* 3.5 4.3  CL 104   < > 105 107 106 108 106  CO2 26   < > 27 26 26 26 27   GLUCOSE 153*   < > 121* 124* 128* 124* 125*  BUN 12   < > 11 9 11 12 11   CREATININE 0.56   < > 0.57 0.56 0.58 0.53 0.63  CALCIUM 8.0*   < > 8.0* 8.4* 8.6* 8.7* 8.7*  MG 2.0  --   --  1.8  --   --   --    < > = values in this interval not displayed.   Liver Function Tests: Recent Labs  Lab 09/04/20 0110  AST 46*  ALT 31  ALKPHOS 82  BILITOT 0.6  PROT 6.1*  ALBUMIN 2.4*   No results for input(s): LIPASE, AMYLASE in the last 168 hours. Recent Labs  Lab 09/03/20 0926 09/04/20 0110 09/05/20 1400 09/06/20 0630  09/08/20 0910  AMMONIA 48* 52* 54* 44* 43*   CBC: Recent Labs  Lab 09/04/20 0110 09/05/20 0027 09/06/20 0337 09/07/20 0450 09/09/20 0226  WBC 3.4* 3.8* 2.9* 2.7* 3.0*  HGB 8.8* 9.1* 9.4* 9.2* 9.6*  HCT 27.1* 28.5* 29.4* 29.0* 30.8*  MCV 91.6 92.2 92.5 93.2 94.5  PLT 70* 71* 73* 74* 75*   Cardiac Enzymes: No results for input(s): CKTOTAL, CKMB, CKMBINDEX, TROPONINI in the last 168 hours. BNP: Invalid input(s): POCBNP CBG: Recent Labs  Lab 09/08/20 1912 09/08/20 2304 09/09/20 0308 09/09/20 0748 09/09/20 1137  GLUCAP 153* 120* 121* 95 119*   D-Dimer No results for input(s): DDIMER in the last 72 hours. Hgb A1c No results for input(s): HGBA1C in the last 72 hours. Lipid Profile No results for input(s): CHOL,  HDL, LDLCALC, TRIG, CHOLHDL, LDLDIRECT in the last 72 hours. Thyroid function studies Recent Labs    09/08/20 1727  TSH 0.635   Anemia work up Recent Labs    09/08/20 1727  VITAMINB12 326   Urinalysis    Component Value Date/Time   COLORURINE AMBER (A) 08/31/2020 0921   APPEARANCEUR HAZY (A) 08/31/2020 0921   LABSPEC 1.023 08/31/2020 0921   PHURINE 6.0 08/31/2020 0921   GLUCOSEU 50 (A) 08/31/2020 0921   HGBUR SMALL (A) 08/31/2020 0921   BILIRUBINUR NEGATIVE 08/31/2020 0921   BILIRUBINUR negative 04/10/2020 [place] NEGATIVE 08/31/2020 0921   PROTEINUR 30 (A) 08/31/2020 0921   UROBILINOGEN 1.0 04/10/2020 1822   UROBILINOGEN 1.0 10/07/2013 0644   NITRITE NEGATIVE 08/31/2020 0921   LEUKOCYTESUR NEGATIVE 08/31/2020 0921   Sepsis Labs Invalid input(s): PROCALCITONIN,  WBC,  LACTICIDVEN Microbiology Recent Results (from the past 240 hour(s))  Urine culture     Status: Abnormal   Collection Time: 08/31/20  9:21 AM   Specimen: Urine, Clean Catch  Result Value Ref Range Status   Specimen Description   Final    URINE, CLEAN CATCH Performed at [facility], [address] [place], [place] [address]    Special Requests   Final    NONE Performed at [facility], [address], [place], [place] [address]    Culture >=100,000 COLONIES/mL ENTEROCOCCUS FAECALIS (A)  Final   Report Status 09/03/2020 FINAL  Final   Organism ID, Bacteria ENTEROCOCCUS FAECALIS (A)  Final      Susceptibility   Enterococcus faecalis - MIC*    AMPICILLIN <=2 SENSITIVE Sensitive     NITROFURANTOIN <=16 SENSITIVE Sensitive     VANCOMYCIN 1 SENSITIVE Sensitive     * >=100,000 COLONIES/mL ENTEROCOCCUS FAECALIS  Respiratory Panel by RT PCR (Flu A&B, Covid) - Nasopharyngeal Swab     Status: None   Collection Time: 08/31/20  9:34 AM   Specimen: Nasopharyngeal Swab  Result Value Ref Range Status   SARS Coronavirus 2 by RT PCR NEGATIVE NEGATIVE Final    Comment: (NOTE) SARS-CoV-2 target nucleic  acids are NOT DETECTED.  The SARS-CoV-2 RNA is generally detectable in upper respiratoy specimens during the acute phase of infection. The lowest concentration of SARS-CoV-2 viral copies this assay can detect is 131 copies/mL. A negative result does not preclude SARS-Cov-2 infection and should not be used as the sole basis for treatment or other patient management decisions. A negative result may occur with  improper specimen collection/handling, submission of specimen other than nasopharyngeal swab, presence of viral mutation(s) within the areas targeted by this assay, and inadequate number of viral copies (<131 copies/mL). A negative result must be combined with  clinical observations, patient history, and epidemiological information. The expected result is Negative.  Fact Sheet for Patients:  PinkCheek.be  Fact Sheet for Healthcare Providers:  GravelBags.it  This test is no t yet approved or cleared by the [place] FDA and  has been authorized for detection and/or diagnosis of SARS-CoV-2 by FDA under an Emergency Use Authorization (EUA). This EUA will remain  in effect (meaning this test can be used) for the duration of the COVID-19 declaration under Section 564(b)(1) of the Act, 21 U.S.C. section 360bbb-3(b)(1), unless the authorization is terminated or revoked sooner.     Influenza A by PCR NEGATIVE NEGATIVE Final   Influenza B by PCR NEGATIVE NEGATIVE Final    Comment: (NOTE) The Xpert Xpress SARS-CoV-2/FLU/RSV assay is intended as an aid in  the diagnosis of influenza from Nasopharyngeal swab specimens and  should not be used as a sole basis for treatment. Nasal washings and  aspirates are unacceptable for Xpert Xpress SARS-CoV-2/FLU/RSV  testing.  Fact Sheet for Patients: PinkCheek.be  Fact Sheet for Healthcare Providers: GravelBags.it  This test  is not yet approved or cleared by the [place] FDA and  has been authorized for detection and/or diagnosis of SARS-CoV-2 by  FDA under an Emergency Use Authorization (EUA). This EUA will remain  in effect (meaning this test can be used) for the duration of the  Covid-19 declaration under Section 564(b)(1) of the Act, 21  U.S.C. section 360bbb-3(b)(1), unless the authorization is  terminated or revoked. Performed at [facility], [address] [place] [address], [place], [place] [address]   Culture, blood (routine x 2)     Status: None   Collection Time: 08/31/20 10:18 AM   Specimen: BLOOD  Result Value Ref Range Status   Specimen Description   Final    BLOOD BLOOD LEFT ARM Performed at [facility], [place] [address] [place], [place] [address]    Special Requests   Final    BOTTLES DRAWN AEROBIC AND ANAEROBIC Blood Culture adequate volume Performed at [facility], [place] [address], [place], [place] [address]    Culture   Final    NO GROWTH 5 DAYS Performed at [facility], [address] [place], [place] [address]    Report Status 09/05/2020 FINAL  Final  MRSA PCR Screening     Status: None   Collection Time: 08/31/20  6:27 PM   Specimen: Nasopharyngeal  Result Value Ref Range Status   MRSA by PCR NEGATIVE NEGATIVE Final    Comment:        The GeneXpert MRSA Assay (FDA approved for NASAL specimens only), is one component of a comprehensive MRSA colonization surveillance program. It is not intended to diagnose MRSA infection nor to guide or monitor treatment for MRSA infections. Performed at [facility], [place] [address], [place], [place] [address]   Culture, respiratory     Status: None   Collection Time: 09/01/20 12:11 AM   Specimen: Tracheal Aspirate  Result Value Ref Range Status   Specimen Description TRACHEAL ASPIRATE  Final   Special Requests NONE  Final   Gram Stain   Final    FEW WBC PRESENT, PREDOMINANTLY PMN FEW GRAM  NEGATIVE RODS RARE GRAM POSITIVE COCCI Performed at [facility], [place] [address], [place],  [address]    Culture MODERATE SERRATIA MARCESCENS  Final   Report Status 09/03/2020 FINAL  Final   Organism ID, Bacteria SERRATIA MARCESCENS  Final  Susceptibility   Serratia marcescens - MIC*    CEFAZOLIN >=64 RESISTANT Resistant     CEFEPIME <=0.12 SENSITIVE Sensitive     CEFTAZIDIME <=1 SENSITIVE Sensitive     CEFTRIAXONE <=0.25 SENSITIVE Sensitive     CIPROFLOXACIN <=0.25 SENSITIVE Sensitive     GENTAMICIN <=1 SENSITIVE Sensitive     TRIMETH/SULFA <=20 SENSITIVE Sensitive     * MODERATE SERRATIA MARCESCENS  Culture, blood (single)     Status: None   Collection Time: 09/01/20  2:55 AM   Specimen: BLOOD  Result Value Ref Range Status   Specimen Description BLOOD SITE NOT SPECIFIED  Final   Special Requests   Final    BOTTLES DRAWN AEROBIC AND ANAEROBIC Blood Culture adequate volume   Culture   Final    NO GROWTH 5 DAYS Performed at [facility], [address] [place], [place] [address]    Report Status 09/06/2020 FINAL  Final    Please note: You were cared for by a hospitalist during your hospital stay. Once you are discharged, your primary care physician will handle any further medical issues. Please note that NO REFILLS for any discharge medications will be authorized once you are discharged, as it is imperative that you return to your primary care physician (or establish a relationship with a primary care physician if you do not have one) for your post hospital discharge needs so that they can reassess your need for medications and monitor your lab values.    Time coordinating discharge: 40 minutes  SIGNED:   Shelly Coss, MD  [facility] 09/09/2020, 1:56 PM Pager [phone]  If 7PM-7AM, please contact night-coverage [email or web address] Password [email or web address]

## 2020-09-10 ENCOUNTER — Ambulatory Visit (HOSPITAL_COMMUNITY): Payer: Medicaid Other | Attending: Family Medicine | Admitting: Physical Therapy

## 2020-09-10 ENCOUNTER — Ambulatory Visit (INDEPENDENT_AMBULATORY_CARE_PROVIDER_SITE_OTHER): Payer: Medicaid Other | Admitting: Nurse Practitioner

## 2020-09-10 ENCOUNTER — Encounter: Payer: Self-pay | Admitting: Nurse Practitioner

## 2020-09-10 ENCOUNTER — Ambulatory Visit: Payer: Medicaid Other | Admitting: Nurse Practitioner

## 2020-09-10 ENCOUNTER — Other Ambulatory Visit: Payer: Self-pay

## 2020-09-10 VITALS — BP 128/75 | HR 76 | Wt 283.0 lb

## 2020-09-10 DIAGNOSIS — E1165 Type 2 diabetes mellitus with hyperglycemia: Secondary | ICD-10-CM | POA: Diagnosis not present

## 2020-09-10 DIAGNOSIS — R41841 Cognitive communication deficit: Secondary | ICD-10-CM | POA: Insufficient documentation

## 2020-09-10 DIAGNOSIS — R0602 Shortness of breath: Secondary | ICD-10-CM | POA: Insufficient documentation

## 2020-09-10 MED ORDER — PEN NEEDLES 32G X 6 MM MISC: 1.0000 | Freq: Three times a day (TID) | 3 refills | Status: DC

## 2020-09-10 NOTE — Therapy (Signed)
[place], [place], [address] Phone: [phone]   Fax:  [phone]  Physical Therapy Screen   Patient Details  Name: Jaime [name] MRN: [id number] Date of Birth: [birth year] No data recorded  Encounter Date: 09/10/2020   PT End of Session - 09/10/20 0852    Visit Number 1    Number of Visits 1    Authorization Type Medicaid    PT Start Time 0818    PT Stop Time 0845    PT Time Calculation (min) 27 min          Patein presents to physical therapy with her mother today. Patient currently does not speak. Her mother reports chief complaint of not speaking. This is the only stated deficit at this time. Patient was hospitalized 08/31/20 after being found unconscious. Patient underwent speech and physical therapy in hospital, but has since recovered functional mobility. Performed physical therapy screen, testing BLE MMT, gait, transfers, and stair ambulation. Patient currently [facility] for tasks assessed. Educated patient and her mother that speech therapy consult appears to be the most appropriate plan of action, given current presentation and chief complaint. Educated patient and her mother on referral process. Order sent to patient's primary care provider.   Past Medical History:  Diagnosis Date  . Anxiety   . Asthma   . Chronic abdominal pain   . Chronic back pain   . Cirrhosis ([place])   . Cirrhosis ([place])   . COPD (chronic obstructive pulmonary disease) ([place])   . Depression   . Diabetes mellitus without complication ([place])   . Diabetes mellitus, type II ([place])   . Hepatitis C   . Insomnia   . 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 Lupus ([place])   . Migraine headache   . Neuropathy   . Nocturnal seizures ([place])   . Peptic ulcer   . Spleen enlarged     Past Surgical History:  Procedure Laterality Date  . CHOLECYSTECTOMY      There were no vitals filed for this  visit.                    Objective measurements completed on examination: See above findings.               PT Education - 09/10/20 0853    Education Details on scope of physical therapy and speech therapy practice. On referral process for speech therapy eval    Person(s) Educated Patient;Parent(s)   mother   Methods Explanation    Comprehension Verbalized understanding                       Plan - 09/10/20 0854    PT Next Visit Plan Physical therapay screening with patient and caregiver education    Recommended Other Services Speech therapy evaluation (sent order)    Consulted and Agree with Plan of Care Patient;Family member/caregiver           Patient will benefit from skilled therapeutic intervention in order to improve the following deficits and impairments:     Visit Diagnosis: No diagnosis found.     Problem List Patient Active Problem List   Diagnosis Date Noted  . Adjustment disorder with depressed mood 09/07/2020  . Respiratory failure ([place]) 08/31/2020  . Acute encephalopathy 08/31/2020  . Hyponatremia 08/16/2020  . Volume overload 08/14/2020  . Dyspnea 07/29/2020  . Nexplanon insertion 05/20/2020  .  Nexplanon removal 05/20/2020  . Pressure injury of skin 01/29/2019  . Uncontrolled diabetes mellitus ([place]) 09/19/2018  . Hyperglycemia 09/17/2018  . Acute respiratory failure with hypoxia ([place]) 09/15/2018  . Anxiety 09/15/2018  . Chronic abdominal pain 09/15/2018  . Diabetes mellitus without complication ([place]) 84/10/8207  . Hepatitis C 09/15/2018  . Peptic ulcer 09/15/2018  . 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 Cirrhosis ([place]) 09/15/2018  . Acute hypoxemic respiratory failure ([place]) 07/17/2016  . Pleural effusion on right 07/17/2016  . Multiple rib fractures involving four or more ribs 07/17/2016  . Hemothorax on right 07/17/2016  . Chronic pain disorder 07/17/2016     9:02 AM, 09/10/20 Josue Hector PT DPT  Physical Therapist with [facility]  [phone] [place] [address] [place], [place], [address] Phone: [phone]   Fax:  [phone]  Name: Jaime [name] MRN: [id number] Date of Birth: 10/31/1986

## 2020-09-10 NOTE — Progress Notes (Signed)
09/10/2020, 10:33 AM     Endocrinology Follow Up Visit  Subjective:    Patient ID: Jaime [name], female    DOB: [birth year].  [facility] is being seen in follow up for management of currently uncontrolled symptomatic diabetes requested by  Neale Burly, MD.   Past Medical History:  Diagnosis Date  . Anxiety   . Asthma   . Chronic abdominal pain   . Chronic back pain   . Cirrhosis ([place])   . Cirrhosis ([place])   . COPD (chronic obstructive pulmonary disease) ([place])   . Depression   . Diabetes mellitus without complication ([place])   . Diabetes mellitus, type II ([place])   . Hepatitis C   . Insomnia   . Long-term current use of methadone for opiate dependence ([place])   . Lupus ([place])   . Migraine headache   . Neuropathy   . Nocturnal seizures ([place])   . Peptic ulcer   . Spleen enlarged     Past Surgical History:  Procedure Laterality Date  . CHOLECYSTECTOMY      Social History   Socioeconomic History  . Marital status: Single    Spouse name: Not on file  . Number of children: Not on file  . Years of education: Not on file  . Highest education level: Not on file  Occupational History  . Not on file  Tobacco Use  . Smoking status: Current Every Day Smoker    Packs/day: 0.00    Years: 5.00    Pack years: 0.00    Types: E-cigarettes  . Smokeless tobacco: Never Used  Vaping Use  . Vaping Use: Every day  . Substances: Nicotine, Flavoring  Substance and Sexual Activity  . Alcohol use: No  . Drug use: No  . Sexual activity: Not Currently    Birth control/protection: Implant  Other Topics Concern  . Not on file  Social History Narrative  . Not on file   Social Determinants of Health   Financial Resource Strain: Medium Risk  . Difficulty of Paying Living Expenses: Somewhat hard  Food Insecurity: No Food Insecurity  . Worried About Charity fundraiser in the Last Year:  Never true  . Ran Out of Food in the Last Year: Never true  Transportation Needs: No Transportation Needs  . Lack of Transportation (Medical): No  . Lack of Transportation (Non-Medical): No  Physical Activity: Insufficiently Active  . Days of Exercise per Week: 1 day  . Minutes of Exercise per Session: 20 min  Stress: Stress Concern Present  . Feeling of Stress : Very much  Social Connections: Moderately Isolated  . Frequency of Communication with Friends and Family: More than three times a week  . Frequency of Social Gatherings with Friends and Family: Twice a week  . Attends Religious Services: More than 4 times per year  . Active Member of Clubs or Organizations: No  . Attends Archivist Meetings: Never  . Marital Status: Never married    Family History  Problem Relation Age of Onset  . Hypertension Mother   . Hyperlipidemia Mother   .  Hyperlipidemia Maternal Grandfather     Outpatient Encounter Medications as of 09/10/2020  Medication Sig  . Accu-Chek Softclix Lancets lancets 4 (four) times daily.  Marland Kitchen albuterol (ACCUNEB) 0.63 MG/3ML nebulizer solution Take by nebulization 4 (four) times daily.  Marland Kitchen albuterol (PROVENTIL) (2.5 MG/3ML) 0.083% nebulizer solution Take 2.5 mg by nebulization every 6 (six) hours as needed for wheezing or shortness of breath.  Marland Kitchen aspirin EC 81 MG tablet Take 162 mg by mouth daily. Swallow whole.  . cetirizine (ZYRTEC ALLERGY) 10 MG tablet Take 1 tablet (10 mg total) by mouth daily.  . clonazePAM (KLONOPIN) 0.5 MG tablet Take 1 tablet (0.5 mg total) by mouth 2 (two) times daily as needed for anxiety.  . Continuous Blood Gluc Sensor (DEXCOM G6 SENSOR) MISC 4 Pieces by Does not apply route once a week.  . Continuous Blood Gluc Transmit (DEXCOM G6 TRANSMITTER) MISC 1 Piece by Does not apply route as directed.  . etonogestrel (NEXPLANON) 68 MG IMPL implant 1 each by Subdermal route once.  . furosemide (LASIX) 40 MG tablet Take 80 mg by mouth daily.    Marland Kitchen glucose blood (ACCU-CHEK GUIDE) test strip Use as instructed to check blood glucose four times daily  . hyoscyamine (ANASPAZ) 0.125 MG TBDP disintergrating tablet Place 0.125 mg under the tongue 3 (three) times daily as needed for bladder spasms or cramping (Patient Preference).   . insulin isophane & regular human (HUMULIN 70/30 MIX) (70-30) 100 UNIT/ML KwikPen Inject 20 Units into the skin 2 (two) times daily. Inject 90 units with breakfast and 80 units with supper  . Insulin Pen Needle (PEN NEEDLES) 32G X 6 MM MISC 1 each by Does not apply route 3 (three) times daily.  Marland Kitchen lactulose (CHRONULAC) 10 GM/15ML solution Take 30 mLs (20 g total) by mouth 3 (three) times daily.  Marland Kitchen liraglutide (VICTOZA) 18 MG/3ML SOPN Inject 1.8 mg into the skin daily.  Marland Kitchen lisinopril (ZESTRIL) 5 MG tablet Take 1 tablet (5 mg total) by mouth daily.  . metFORMIN (GLUCOPHAGE) 1000 MG tablet Take 1 tablet (1,000 mg total) by mouth 2 (two) times daily with a meal.  . methadone (DOLOPHINE) 10 MG/5ML solution Take 30 mg by mouth every morning.   . Multiple Vitamin (MULTIVITAMIN) tablet Take 1 tablet by mouth daily.  Marland Kitchen nystatin (MYCOSTATIN) 100000 UNIT/ML suspension Take 5 mLs (500,000 Units total) by mouth 4 (four) times daily.  . ondansetron (ZOFRAN-ODT) 4 MG disintegrating tablet Take 4 mg by mouth every 8 (eight) hours as needed for nausea.   . pantoprazole (PROTONIX) 40 MG tablet Take 1 tablet (40 mg total) by mouth daily.  . potassium chloride (KLOR-CON) 10 MEQ tablet Take 20 mEq by mouth daily.   . pregabalin (LYRICA) 75 MG capsule Take 75 mg by mouth in the morning, at noon, in the evening, and at bedtime.   . propranolol (INDERAL) 10 MG tablet Take 10 mg by mouth 3 (three) times daily.  Derrill Memo ON 10/16/2020] QUEtiapine (SEROQUEL) 25 MG tablet Take 1 tablet (25 mg total) by mouth at bedtime.  . RESTASIS 0.05 % ophthalmic emulsion Place 1 drop into both eyes as needed (dry eye).   . rosuvastatin (CRESTOR) 5 MG tablet  Take 1 tablet (5 mg total) by mouth daily.  Marland Kitchen spironolactone (ALDACTONE) 100 MG tablet Take 100 mg by mouth daily.   . sucralfate (CARAFATE) 1 g tablet Take 1 g by mouth 4 (four) times daily.  Derrill Memo ON 10/17/2020] venlafaxine XR (EFFEXOR-XR) 150 MG 24  hr capsule Take total of 225 mg daily (150 mg + 75 mg )  . venlafaxine XR (EFFEXOR-XR) 75 MG 24 hr capsule Take 75 mg by mouth daily. To take along with 165m for a total of 225 mg daily   No facility-administered encounter medications on file as of 09/10/2020.    ALLERGIES: Allergies  Allergen Reactions  . Iodine-131 Anaphylaxis, Shortness Of Breath and Swelling  . Ivp Dye [Iodinated Diagnostic Agents] Anaphylaxis    Skin gets very red, unable to walk   . Ketorolac Tromethamine Shortness Of Breath  . Tylenol [Acetaminophen] Other (See Comments)    Due to cirrhosis   . Gabapentin   . Morphine And Related     Pt says she is not allergic to Morphine 08/12/20  . Nsaids Other (See Comments)    Flares ulcers  . Suboxone [Buprenorphine Hcl-Naloxone Hcl]   . Vancomycin Swelling    Facial swelling,     VACCINATION STATUS: Immunization History  Administered Date(s) Administered  . Influenza,inj,Quad PF,6+ Mos 07/18/2016  . Moderna SARS-COVID-2 Vaccination 03/07/2020, 04/04/2020  . Pneumococcal Polysaccharide-23 07/18/2016, 08/01/2020    Diabetes She presents for her initial diabetic visit. She has type 2 diabetes mellitus. Onset time: She was diagnosed at approximate age of 89years. Her disease course has been improving. Hypoglycemia symptoms include nervousness/anxiousness and sweats. Pertinent negatives for hypoglycemia include no headaches, pallor or tremors. Associated symptoms include fatigue and foot paresthesias. Pertinent negatives for diabetes include no chest pain, no polydipsia, no polyphagia, no polyuria and no weight loss. Hypoglycemia complications include hospitalization and nocturnal hypoglycemia. Symptoms are improving.  Diabetic complications include heart disease and peripheral neuropathy. Risk factors for coronary artery disease include diabetes mellitus, obesity, sedentary lifestyle, dyslipidemia and hypertension. Current diabetic treatment includes insulin injections and oral agent (monotherapy). She is compliant with treatment some of the time. Her weight is fluctuating dramatically (r/t fluid retention). She is following a low salt and generally unhealthy diet. When asked about meal planning, she reported none. She has had a previous visit with a dietitian. She participates in exercise intermittently. Her home blood glucose trend is decreasing rapidly. Her breakfast blood glucose range is generally 130-140 mg/dl. Her lunch blood glucose range is generally 140-180 mg/dl. Her dinner blood glucose range is generally >200 mg/dl. Her bedtime blood glucose range is generally 140-180 mg/dl. (She presents today for a hospital follow up, following recent stay after she was found unresponsive in her home by her family.  Her glucose had dropped into the 30s.  She had to be intubated for approximately 1 week.  She is now undergoing rehabilitation for encephalopathy.  Her mom is accompanying her today, and will be assuming responsibility for her diabetes management going forward.  Her Dexcom was removed during her hospitalization, so her mother has been checking through fingersticks lately.  At discharge, the hospital decreased her dose of 70/30 to 20 units BID with breakfast and supper and continued her Victoza 1.8 mg SQ daily.  -She has a history of using regular insulin (which she purchases OTC at [facility] to treat high glucose without the approval from provider.  She admits to doing this prior to her hypoglycemic event causing her to become unresponsive.) An ACE inhibitor/angiotensin II receptor blocker is being taken. She does not see a podiatrist.Eye exam is not current.  Hypertension This is a new problem. The current episode  started more than 1 month ago. The problem is unchanged. The problem is uncontrolled. Associated symptoms include peripheral edema  and sweats. Pertinent negatives include no chest pain, headaches, palpitations or shortness of breath. There are no associated agents to hypertension. Risk factors for coronary artery disease include diabetes mellitus, dyslipidemia, obesity and sedentary lifestyle. Past treatments include diuretics. The current treatment provides no improvement. There are no compliance problems.      Review of systems  Constitutional: + dramatically fluctuating body weight,  current Body mass index is 41.79 kg/m. , + fatigue, no subjective hyperthermia, no subjective hypothermia, + difficulty speaking (r/t encephalopathy) Eyes: + blurry vision, no xerophthalmia ENT: no sore throat, no nodules palpated in throat, no dysphagia/odynophagia, no hoarseness Cardiovascular: no chest pain, no shortness of breath, no palpitations, mild leg swelling Respiratory: no cough, + intermittent shortness of breath Gastrointestinal: no nausea/vomiting/diarrhea Musculoskeletal: no muscle/joint aches, + BUE weakness r/t recent hospitalization for encephalopathy Skin: no rashes, no hyperemia Neurological: no tremors, no numbness, no tingling, no dizziness Psychiatric: no depression, no anxiety  Objective:     BP 128/75 (BP Location: Left Arm, Patient Position: Sitting)   Pulse 76   Wt 283 lb (128.4 kg)   BMI 41.79 kg/m   Wt Readings from Last 3 Encounters:  09/10/20 283 lb (128.4 kg)  09/09/20 295 lb 3.1 oz (133.9 kg)  08/17/20 287 lb 14.7 oz (130.6 kg)    BP Readings from Last 3 Encounters:  09/10/20 128/75  09/09/20 119/62  08/17/20 115/69    Physical Exam- Limited  Constitutional:  Body mass index is 41.79 kg/m. , not in acute distress, normal state of mind Eyes:  EOMI, no exophthalmos Neck: Supple Thyroid: No gross goiter Cardiovascular: tachycardic, no murmers, rubs, or  gallops, BLE nonpitting edema Respiratory: Adequate breathing efforts, no crackles, rales, rhonchi, or wheezing Musculoskeletal: no gross deformities, decreased strength intact BUE, no gross restriction of joint movements Skin:  no rashes, no hyperemia Neurological: no tremor    CMP ( most recent) CMP     Component Value Date/Time   NA 142 09/09/2020 0226   NA 143 07/23/2020 0842   K 4.3 09/09/2020 0226   CL 106 09/09/2020 0226   CO2 27 09/09/2020 0226   GLUCOSE 125 (H) 09/09/2020 0226   BUN 11 09/09/2020 0226   BUN 8 07/23/2020 0842   CREATININE 0.63 09/09/2020 0226   CALCIUM 8.7 (L) 09/09/2020 0226   PROT 6.1 (L) 09/04/2020 0110   PROT 6.3 07/23/2020 0842   ALBUMIN 2.4 (L) 09/04/2020 0110   ALBUMIN 3.3 (L) 07/23/2020 0842   AST 46 (H) 09/04/2020 0110   ALT 31 09/04/2020 0110   ALKPHOS 82 09/04/2020 0110   BILITOT 0.6 09/04/2020 0110   BILITOT 0.5 07/23/2020 0842   GFRNONAA >60 09/09/2020 0226   GFRAA >60 08/01/2020 0704     Diabetic Labs (most recent): Lab Results  Component Value Date   HGBA1C 7.1 (H) 08/13/2020   HGBA1C 8.0 (H) 07/29/2020   HGBA1C 8.6 (A) 05/28/2020     Lipid Panel ( most recent) Lipid Panel     Component Value Date/Time   CHOL 210 (H) 07/23/2020 0842   TRIG 86 08/12/2020 1023   HDL 60 07/23/2020 0842   CHOLHDL 3.5 07/23/2020 0842   LDLCALC 116 (H) 07/23/2020 0842   LABVLDL 34 07/23/2020 0842      Lab Results  Component Value Date   TSH 0.635 09/08/2020   TSH 0.993 07/30/2020   TSH 1.990 07/23/2020   TSH 1.690 08/22/2010   FREET4 0.72 (L) 07/23/2020   FREET4 1.11 08/22/2010  Assessment & Plan:   1) Type 2 diabetes mellitus with hyperglycemia, without long-term current use of insulin (Jaime [name])   - Jaime [name] has currently uncontrolled symptomatic type 2 DM since  34 years of age.  She presents today for a hospital follow up, following recent stay after she was found unresponsive in her home by her family.  Her  glucose had dropped into the 30s.  She had to be intubated for approximately 1 week.  She is now undergoing rehabilitation for encephalopathy.  Her mom is accompanying her today, and will be assuming responsibility for her diabetes management going forward.  Her Dexcom was removed during her hospitalization, so her mother has been checking through fingersticks lately.  At discharge, the hospital decreased her dose of 70/30 to 20 units BID with breakfast and supper and continued her Victoza 1.8 mg SQ daily.  -She has a history of using regular insulin (which she purchases OTC at [facility]) to treat high glucose without the approval from provider.  She admits to doing this prior to her hypoglycemic event causing her to become unresponsive.  - I had a long discussion with her about the progressive nature of diabetes and the pathology behind its complications. -her diabetes is complicated by obesity/sedentary life and she remains at a high risk for more acute and chronic complications which include CAD, CVA, CKD, retinopathy, and neuropathy. These are all discussed in detail with her.  - Nutritional counseling repeated at each appointment due to patients tendency to fall back in to old habits.  - The patient admits there is a room for improvement in their diet and drink choices. -  Suggestion is made for the patient to avoid simple carbohydrates from their diet including Cakes, Sweet Desserts / Pastries, Ice Cream, Soda (diet and regular), Sweet Tea, Candies, Chips, Cookies, Sweet Pastries,  Store Bought Juices, Alcohol in Excess of  1-2 drinks a day, Artificial Sweeteners, Coffee Creamer, and "Sugar-free" Products. This will help patient to have stable blood glucose profile and potentially avoid unintended weight gain.   - I encouraged the patient to switch to  unprocessed or minimally processed complex starch and increased protein intake (animal or plant source), fruits, and vegetables.   - Patient is  advised to stick to a routine mealtimes to eat 3 meals  a day and avoid unnecessary snacks ( to snack only to correct hypoglycemia).  - I have approached her with the following individualized plan to manage  her diabetes and patient agrees:   -Since her recent hospitalization, her mother has assumed responsibility for her diabetes management.  She was instructed on proper insulin injection technique by the nurse.  Her most recent blood glucose readings are at a safe range.  She is advised to continue Humulin 70/30 20 units with breakfast and 20 units with supper, when eating and when glucose is above 90.  She is advised to continue Victoza 1.8 mg SQ daily and Metformin 1000 mg po twice daily with meals (renal function stable).  -She is encouraged to continue using CGM to monitor glucose 4 times daily, before meals and before bed and log on the clinic sheets provided.  They are instructed to call the clinic if she has readings less than 70 or greater than 200 for 3 tests in a row.  -Mom brought all of the insulin supplies from home, we went through these together.  I encouraged her to dispose of any insulin other than the Victoza and Humulin 70/30 to  avoid inadvertently double dosing for safety purposes.   - Specific targets for  A1c;  LDL, HDL,  and Triglycerides were discussed with the patient.  2) Blood Pressure /Hypertension: Her blood pressure is controlled to target.  She is advised to continue Lasix 80 mg po daily, continue Lisinopril 5 mg po daily, continue Propanolol 10 mg po TID, and Aldactone 100 mg o daily  3) Lipids/Hyperlipidemia:   Review of her recent lipid profile from 07/23/20 shows uncontrolled LDL at 116 and elevated triglycerides of 197.  She is not on any statin medications.  I discussed and initiated Crestor 5 mg po daily at bedtime.  Side effects and precautions discussed with her.  4)  Weight/Diet:  Her Body mass index is 41.79 kg/m.  -   clearly complicating her diabetes  care.   she is  a candidate for modest weight loss. I discussed with her the fact that loss of 5 - 10% of her  current body weight will have the most impact on her diabetes management.  Exercise, and detailed carbohydrates information provided  -  detailed on discharge instructions.  5) Chronic Care/Health Maintenance: -she is on ACE and statin medications and is encouraged to initiate and continue to follow up with Ophthalmology, Dentist,  Podiatrist at least yearly or according to recommendations, and advised to   stay away from smoking. I have recommended yearly flu vaccine and pneumonia vaccine at least every 5 years; moderate intensity exercise for up to 150 minutes weekly; and  sleep for at least 7 hours a day.  - she is  advised to maintain close follow up with Hasanaj, Samul Dada, MD for primary care needs, as well as her other providers for optimal and coordinated care.   - Time spent on this patient care encounter:  35 min, of which > 50% was spent in  counseling and the rest reviewing her blood glucose logs , discussing her hypoglycemia and hyperglycemia episodes, reviewing her current and  previous labs / studies  ( including abstraction from other facilities) and medications  doses and developing a  long term treatment plan and documenting her care.   Please refer to Patient Instructions for Blood Glucose Monitoring and Insulin/Medications Dosing Guide"  in media tab for additional information. Please  also refer to " Patient Self Inventory" in the Media  tab for reviewed elements of pertinent patient history.  [facility] participated in the discussions, expressed understanding, and voiced agreement with the above plans.  All questions were answered to her satisfaction. she is encouraged to contact clinic should she have any questions or concerns prior to her return visit.  Follow up plan: - Return in about 2 weeks (around 09/24/2020) for Diabetes follow up, Bring glucometer and  logs.  Rayetta Pigg, [facility] [address] [place], [place] [address] Phone: [phone] Fax: [phone]   09/10/2020, 10:33 AM

## 2020-09-10 NOTE — Patient Instructions (Signed)

## 2020-09-24 ENCOUNTER — Encounter (HOSPITAL_COMMUNITY): Payer: Self-pay | Admitting: Speech Pathology

## 2020-09-24 ENCOUNTER — Ambulatory Visit (HOSPITAL_COMMUNITY): Payer: Medicaid Other | Admitting: Speech Pathology

## 2020-09-24 ENCOUNTER — Other Ambulatory Visit: Payer: Self-pay

## 2020-09-24 DIAGNOSIS — R0602 Shortness of breath: Secondary | ICD-10-CM | POA: Diagnosis not present

## 2020-09-24 DIAGNOSIS — R41841 Cognitive communication deficit: Secondary | ICD-10-CM

## 2020-09-24 NOTE — Therapy (Signed)
[facility] [place], [place], [address] Phone: [phone]   Fax:  [phone]  Speech Language Pathology Evaluation  Patient Details  Name: Jaime [name] MRN: [id number] Date of Birth: [birth year] Referring Provider (SLP): Stoney Bang   Encounter Date: 09/24/2020   End of Session - 09/24/20 1632    Visit Number 1    Number of Visits 1    Authorization Type Medicaid    SLP Start Time 0626    SLP Stop Time  1610    SLP Time Calculation (min) 46 min    Activity Tolerance Patient tolerated treatment well           Past Medical History:  Diagnosis Date  . Anxiety   . Asthma   . Chronic abdominal pain   . Chronic back pain   . Cirrhosis ([place])   . Cirrhosis ([place])   . COPD (chronic obstructive pulmonary disease) ([place])   . Depression   . Diabetes mellitus without complication ([place])   . Diabetes mellitus, type II ([place])   . Hepatitis C   . Insomnia   . Long-term current use of methadone for opiate dependence ([place])   . Lupus ([place])   . Migraine headache   . Neuropathy   . Nocturnal seizures ([place])   . Peptic ulcer   . Spleen enlarged     Past Surgical History:  Procedure Laterality Date  . CHOLECYSTECTOMY      There were no vitals filed for this visit.   Subjective Assessment - 09/24/20 1602    Subjective "It is getting better."    Special Tests SLUMS    Currently in Pain? 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 She was also found to be hypoglycemic (bood sugars in the 30s).  She did not respond to Narcan or glucose. She was intubated for airway protection 10/30-11/1.  Hospital course remarkable for encephalopathy with slow improvement. She was hospitalized from 08/31/2020 to 09/09/2020. She was essentially nonverbal when she was discharged home. There is no organic cause for speech difficulties (MRI negative, no seizure findings), and it is presumed to be a combination of medications and high ammonia levels contributing to encephalopathy.  She resumed use of methadone last week and also began talking again last week. She was referred for outpatient SLP therapy by her PCP, Dr. Stoney Bang.    Behavioral/Cognition Alert and cooperative    Mobility Status ambulatory      Balance Screen   Has the patient fallen in the past 6 months No    Has the patient had a decrease in activity level because of a fear of falling?  No    Is the patient reluctant to leave their home because of a fear of falling?  No      Prior Functional Status   Cognitive/Linguistic Baseline Within functional limits    Type of Home House     Lives With [facility]  Available Support Family    Education some college    Vocation On disability      Pain Assessment   Pain Assessment No/denies pain      Cognition   Overall Cognitive Status Within Functional Limits for tasks assessed      Auditory Comprehension   Overall Auditory Comprehension Appears within functional limits for tasks assessed    Yes/No Questions Within Functional Limits    Basic Biographical Questions 76-100% accurate    Complex Questions 75-100% accurate    Commands Within Functional Limits    Conversation Complex    Interfering Components Processing speed    EffectiveTechniques Repetition      Visual Recognition/Discrimination   Discrimination Within Function Limits      Reading Comprehension   Reading Status Not tested      Expression   Primary Mode of Expression Verbal      Verbal Expression   Overall Verbal  Expression Appears within functional limits for tasks assessed    Initiation No impairment    Automatic Speech Name;Social Response;Day of week    Level of Generative/Spontaneous Verbalization Conversation    Repetition No impairment    Naming No impairment    Pragmatics No impairment    Non-Verbal Means of Communication Not applicable      Written Expression   Dominant Hand Right    Written Expression Not tested      Oral Motor/Sensory Function   Overall Oral Motor/Sensory Function Appears within functional limits for tasks assessed      Motor Speech   Overall Motor Speech Impaired    Respiration Within functional limits    Phonation Normal    Resonance Within functional limits    Articulation Within functional limitis    Intelligibility Intelligible    Motor Planning Witnin functional limits    Motor Speech Errors Aware    Effective Techniques Increased vocal intensity;Over-articulate    Phonation WFL      Standardized Assessments   Standardized Assessments  --   SLUMS            SLP Education - 09/24/20 1630    Education Details No treatment recommended at this time    Person(s) Educated Patient    Methods Explanation    Comprehension Verbalized understanding            SLP Short Term Goals - 09/24/20 1634      SLP SHORT TERM GOAL #1   Title N/A              Plan - 09/24/20 1632    Clinical Impression Statement Cognitive linguistic skills are WNL. Her aphasia has resolved and she currently presents with mild dysarthria (Pt reports close to baseline, just a little slower). [facility] SLUMS [facility] Mental Status) was administered and Pt scored a 29/30 indicating a normal score (minus one for a calculation). She was able to express complex thoughts and ideas independently and follow all directions and answer questions. No further SLP services indicated at this time. Pt is in agreement with plan of care.    Consulted and Agree with Plan of Care  Patient           Patient will benefit from skilled therapeutic intervention in order to improve the following deficits and impairments:   Cognitive communication deficit    Problem List Patient Active Problem List   Diagnosis Date Noted  . Adjustment disorder with depressed mood 09/07/2020  . Respiratory failure ([place]) 08/31/2020  .  Acute encephalopathy 08/31/2020  . Hyponatremia 08/16/2020  . Volume overload 08/14/2020  . Dyspnea 07/29/2020  . Nexplanon insertion 05/20/2020  . Nexplanon removal 05/20/2020  . Pressure injury of skin 01/29/2019  . Uncontrolled diabetes mellitus ([place]) 09/19/2018  . Hyperglycemia 09/17/2018  . Acute respiratory failure with hypoxia ([place]) 09/15/2018  . Anxiety 09/15/2018  . Chronic abdominal pain 09/15/2018  . Diabetes mellitus without complication ([place]) 40/08/2724  . Hepatitis C 09/15/2018  . Peptic ulcer 09/15/2018  . Long-term current use of methadone for opiate dependence ([place]) 09/15/2018  . Cirrhosis ([place]) 09/15/2018  . Acute hypoxemic respiratory failure ([place]) 07/17/2016  . Pleural effusion on right 07/17/2016  . Multiple rib fractures involving four or more ribs 07/17/2016  . Hemothorax on right 07/17/2016  . Chronic pain disorder 07/17/2016   Thank you,  Genene Churn, [place]  [facility] 09/24/2020, 4:47 PM  [facility] [address] [place], [place], [address] Phone: [phone]   Fax:  [phone]  Name: Deneane Stifter MRN: [id number] Date of Birth: [birth year]

## 2020-09-25 ENCOUNTER — Encounter: Payer: Self-pay | Admitting: Nurse Practitioner

## 2020-09-25 ENCOUNTER — Ambulatory Visit (INDEPENDENT_AMBULATORY_CARE_PROVIDER_SITE_OTHER): Payer: Medicaid Other | Admitting: Nurse Practitioner

## 2020-09-25 VITALS — BP 115/76 | Ht 69.0 in | Wt 275.0 lb

## 2020-09-25 DIAGNOSIS — E1165 Type 2 diabetes mellitus with hyperglycemia: Secondary | ICD-10-CM

## 2020-09-25 DIAGNOSIS — E782 Mixed hyperlipidemia: Secondary | ICD-10-CM | POA: Diagnosis not present

## 2020-09-25 MED ORDER — INSULIN ISOPHANE & REGULAR (HUMAN 70-30)100 UNIT/ML KWIKPEN: 25.0000 [IU] | PEN_INJECTOR | Freq: Two times a day (BID) | SUBCUTANEOUS | 6 refills | Status: DC

## 2020-09-25 NOTE — Progress Notes (Signed)
09/25/2020, 10:52 AM     Endocrinology Follow Up Visit  Subjective:    Patient ID: Jaime [name], female    DOB: 09-11-1986.  [facility] is being seen in follow up for management of currently uncontrolled symptomatic diabetes requested by  Neale Burly, MD.   Past Medical History:  Diagnosis Date  . Anxiety   . Asthma   . Chronic abdominal pain   . Chronic back pain   . Cirrhosis ([place])   . Cirrhosis ([place])   . COPD (chronic obstructive pulmonary disease) ([place])   . Depression   . Diabetes mellitus without complication ([place])   . Diabetes mellitus, type II ([place])   . Hepatitis C   . Insomnia   . Long-term current use of methadone for opiate dependence ([place])   . Lupus ([place])   . Migraine headache   . Neuropathy   . Nocturnal seizures ([place])   . Peptic ulcer   . Spleen enlarged     Past Surgical History:  Procedure Laterality Date  . CHOLECYSTECTOMY      Social History   Socioeconomic History  . Marital status: Single    Spouse name: Not on file  . Number of children: Not on file  . Years of education: Not on file  . Highest education level: Not on file  Occupational History  . Not on file  Tobacco Use  . Smoking status: Current Every Day Smoker    Packs/day: 0.00    Years: 5.00    Pack years: 0.00    Types: E-cigarettes  . Smokeless tobacco: Never Used  Vaping Use  . Vaping Use: Every day  . Substances: Nicotine, Flavoring  Substance and Sexual Activity  . Alcohol use: No  . Drug use: No  . Sexual activity: Not Currently    Birth control/protection: Implant  Other Topics Concern  . Not on file  Social History Narrative  . Not on file   Social Determinants of Health   Financial Resource Strain: Medium Risk  . Difficulty of Paying Living Expenses: Somewhat hard  Food Insecurity: No Food Insecurity  . Worried About Charity fundraiser in the Last Year:  Never true  . Ran Out of Food in the Last Year: Never true  Transportation Needs: No Transportation Needs  . Lack of Transportation (Medical): No  . Lack of Transportation (Non-Medical): No  Physical Activity: Insufficiently Active  . Days of Exercise per Week: 1 day  . Minutes of Exercise per Session: 20 min  Stress: Stress Concern Present  . Feeling of Stress : Very much  Social Connections: Moderately Isolated  . Frequency of Communication with Friends and Family: More than three times a week  . Frequency of Social Gatherings with Friends and Family: Twice a week  . Attends Religious Services: More than 4 times per year  . Active Member of Clubs or Organizations: No  . Attends Archivist Meetings: Never  . Marital Status: Never married    Family History  Problem Relation Age of Onset  . Hypertension Mother   . Hyperlipidemia Mother   .  Hyperlipidemia Maternal Grandfather     Outpatient Encounter Medications as of 09/25/2020  Medication Sig  . Accu-Chek Softclix Lancets lancets 4 (four) times daily.  Marland Kitchen albuterol (ACCUNEB) 0.63 MG/3ML nebulizer solution Take by nebulization 4 (four) times daily.  Marland Kitchen albuterol (PROVENTIL) (2.5 MG/3ML) 0.083% nebulizer solution Take 2.5 mg by nebulization every 6 (six) hours as needed for wheezing or shortness of breath.  Marland Kitchen aspirin EC 81 MG tablet Take 162 mg by mouth daily. Swallow whole.  . cetirizine (ZYRTEC ALLERGY) 10 MG tablet Take 1 tablet (10 mg total) by mouth daily.  . clonazePAM (KLONOPIN) 0.5 MG tablet Take 1 tablet (0.5 mg total) by mouth 2 (two) times daily as needed for anxiety.  . Continuous Blood Gluc Sensor (DEXCOM G6 SENSOR) MISC 4 Pieces by Does not apply route once a week.  . Continuous Blood Gluc Transmit (DEXCOM G6 TRANSMITTER) MISC 1 Piece by Does not apply route as directed.  . etonogestrel (NEXPLANON) 68 MG IMPL implant 1 each by Subdermal route once.  . furosemide (LASIX) 40 MG tablet Take 80 mg by mouth daily.    Marland Kitchen glucose blood (ACCU-CHEK GUIDE) test strip Use as instructed to check blood glucose four times daily  . hyoscyamine (ANASPAZ) 0.125 MG TBDP disintergrating tablet Place 0.125 mg under the tongue 3 (three) times daily as needed for bladder spasms or cramping (Patient Preference).   . insulin isophane & regular human (HUMULIN 70/30 MIX) (70-30) 100 UNIT/ML KwikPen Inject 25 Units into the skin 2 (two) times daily. Inject 90 units with breakfast and 80 units with supper  . Insulin Pen Needle (PEN NEEDLES) 32G X 6 MM MISC 1 each by Does not apply route 3 (three) times daily.  Marland Kitchen lactulose (CHRONULAC) 10 GM/15ML solution Take 30 mLs (20 g total) by mouth 3 (three) times daily.  Marland Kitchen liraglutide (VICTOZA) 18 MG/3ML SOPN Inject 1.8 mg into the skin daily.  Marland Kitchen lisinopril (ZESTRIL) 5 MG tablet Take 1 tablet (5 mg total) by mouth daily.  . metFORMIN (GLUCOPHAGE) 1000 MG tablet Take 1 tablet (1,000 mg total) by mouth 2 (two) times daily with a meal.  . methadone (DOLOPHINE) 10 MG/5ML solution Take 30 mg by mouth every morning.   . Multiple Vitamin (MULTIVITAMIN) tablet Take 1 tablet by mouth daily.  . ondansetron (ZOFRAN-ODT) 4 MG disintegrating tablet Take 4 mg by mouth every 8 (eight) hours as needed for nausea.   . pantoprazole (PROTONIX) 40 MG tablet Take 1 tablet (40 mg total) by mouth daily.  . potassium chloride (KLOR-CON) 10 MEQ tablet Take 20 mEq by mouth daily.   . pregabalin (LYRICA) 75 MG capsule Take 75 mg by mouth in the morning, at noon, in the evening, and at bedtime.   . propranolol (INDERAL) 10 MG tablet Take 10 mg by mouth 3 (three) times daily.  . RESTASIS 0.05 % ophthalmic emulsion Place 1 drop into both eyes as needed (dry eye).   . rosuvastatin (CRESTOR) 5 MG tablet Take 1 tablet (5 mg total) by mouth daily.  Marland Kitchen spironolactone (ALDACTONE) 100 MG tablet Take 100 mg by mouth daily.   . sucralfate (CARAFATE) 1 g tablet Take 1 g by mouth 4 (four) times daily.  Derrill Memo ON 10/17/2020]  venlafaxine XR (EFFEXOR-XR) 150 MG 24 hr capsule Take total of 225 mg daily (150 mg + 75 mg )  . venlafaxine XR (EFFEXOR-XR) 75 MG 24 hr capsule Take 75 mg by mouth daily. To take along with 117m for a total of  225 mg daily  . [DISCONTINUED] insulin isophane & regular human (HUMULIN 70/30 MIX) (70-30) 100 UNIT/ML KwikPen Inject 20 Units into the skin 2 (two) times daily. Inject 90 units with breakfast and 80 units with supper  . [START ON 10/16/2020] QUEtiapine (SEROQUEL) 25 MG tablet Take 1 tablet (25 mg total) by mouth at bedtime. (Patient not taking: Reported on 09/25/2020)  . [DISCONTINUED] nystatin (MYCOSTATIN) 100000 UNIT/ML suspension Take 5 mLs (500,000 Units total) by mouth 4 (four) times daily.   No facility-administered encounter medications on file as of 09/25/2020.    ALLERGIES: Allergies  Allergen Reactions  . Iodine-131 Anaphylaxis, Shortness Of Breath and Swelling  . Ivp Dye [Iodinated Diagnostic Agents] Anaphylaxis    Skin gets very red, unable to walk   . Ketorolac Tromethamine Shortness Of Breath  . Tylenol [Acetaminophen] Other (See Comments)    Due to cirrhosis   . Gabapentin   . Morphine And Related     Pt says she is not allergic to Morphine 08/12/20  . Nsaids Other (See Comments)    Flares ulcers  . Suboxone [Buprenorphine Hcl-Naloxone Hcl]   . Vancomycin Swelling    Facial swelling,     VACCINATION STATUS: Immunization History  Administered Date(s) Administered  . Influenza,inj,Quad PF,6+ Mos 07/18/2016  . Moderna SARS-COVID-2 Vaccination 03/07/2020, 04/04/2020  . Pneumococcal Polysaccharide-23 07/18/2016, 08/01/2020    Diabetes She presents for her initial diabetic visit. She has type 2 diabetes mellitus. Onset time: She was diagnosed at approximate age of 15 years. Her disease course has been stable. Hypoglycemia symptoms include nervousness/anxiousness and sweats. Pertinent negatives for hypoglycemia include no headaches, pallor or tremors.  Associated symptoms include fatigue and foot paresthesias. Pertinent negatives for diabetes include no chest pain, no polydipsia, no polyphagia, no polyuria and no weight loss. There are no hypoglycemic complications. Symptoms are stable. Diabetic complications include heart disease and peripheral neuropathy. Risk factors for coronary artery disease include diabetes mellitus, obesity, sedentary lifestyle, dyslipidemia and hypertension. Current diabetic treatment includes insulin injections and oral agent (monotherapy). She is compliant with treatment most of the time. Her weight is increasing steadily (r/t fluid retention). She is following a low salt and generally unhealthy diet. When asked about meal planning, she reported none. She has had a previous visit with a dietitian. She participates in exercise intermittently. Her home blood glucose trend is fluctuating minimally. Her breakfast blood glucose range is generally 180-200 mg/dl. Her lunch blood glucose range is generally >200 mg/dl. Her dinner blood glucose range is generally >200 mg/dl. Her bedtime blood glucose range is generally >200 mg/dl. Her overall blood glucose range is 180-200 mg/dl. (She presents today, accompanied by her mom (who is helping manage her diabetes), with her meter and logs showing above target fasting and postprandial glycemic profile.  Since last visit, she has started eating more, which has caused her glucose to rebound.  There are no hypoglycemic episodes noted.  She had trouble getting her Dexcom to pair with her phone, therefore her mom has been performing fingersticks.) An ACE inhibitor/angiotensin II receptor blocker is being taken. She does not see a podiatrist.Eye exam is not current.  Hypertension This is a new problem. The current episode started more than 1 month ago. The problem is unchanged. The problem is uncontrolled. Associated symptoms include peripheral edema and sweats. Pertinent negatives include no chest pain,  headaches, palpitations or shortness of breath. There are no associated agents to hypertension. Risk factors for coronary artery disease include diabetes mellitus, dyslipidemia, obesity and  sedentary lifestyle. Past treatments include diuretics. The current treatment provides no improvement. There are no compliance problems.      Review of systems  Constitutional: + steadily decreasing body weight,  current Body mass index is 40.61 kg/m. , + fatigue, no subjective hyperthermia, no subjective hypothermia, + difficulty speaking (r/t encephalopathy- improving with SLP) Eyes: + blurry vision, no xerophthalmia ENT: no sore throat, no nodules palpated in throat, no dysphagia/odynophagia, no hoarseness Cardiovascular: no chest pain, no shortness of breath, no palpitations, no swelling Respiratory: no cough, no SOB Gastrointestinal: no nausea/vomiting/diarrhea Musculoskeletal: no muscle/joint aches, + BUE weakness r/t recent hospitalization for encephalopathy (improving with OT) Skin: no rashes, no hyperemia Neurological: no tremors, no numbness, no tingling, no dizziness Psychiatric: no depression, no anxiety  Objective:     BP 115/76   Ht 5' 9"  (1.753 m)   Wt 275 lb (124.7 kg)   BMI 40.61 kg/m   Wt Readings from Last 3 Encounters:  09/25/20 275 lb (124.7 kg)  09/10/20 283 lb (128.4 kg)  09/09/20 295 lb 3.1 oz (133.9 kg)    BP Readings from Last 3 Encounters:  09/25/20 115/76  09/10/20 128/75  09/09/20 119/62    Physical Exam- Limited  Constitutional:  Body mass index is 40.61 kg/m. , not in acute distress, normal state of mind, decreased rate of speech Eyes:  EOMI, no exophthalmos Neck: Supple Thyroid: No gross goiter Cardiovascular: tachycardic, no murmers, rubs, or gallops, no edema Respiratory: Adequate breathing efforts, no crackles, rales, rhonchi, or wheezing Musculoskeletal: no gross deformities, decreased strength intact BUE (improved since last visit), no gross  restriction of joint movements Skin:  no rashes, no hyperemia Neurological: no tremor    CMP ( most recent) CMP     Component Value Date/Time   NA 142 09/09/2020 0226   NA 143 07/23/2020 0842   K 4.3 09/09/2020 0226   CL 106 09/09/2020 0226   CO2 27 09/09/2020 0226   GLUCOSE 125 (H) 09/09/2020 0226   BUN 11 09/09/2020 0226   BUN 8 07/23/2020 0842   CREATININE 0.63 09/09/2020 0226   CALCIUM 8.7 (L) 09/09/2020 0226   PROT 6.1 (L) 09/04/2020 0110   PROT 6.3 07/23/2020 0842   ALBUMIN 2.4 (L) 09/04/2020 0110   ALBUMIN 3.3 (L) 07/23/2020 0842   AST 46 (H) 09/04/2020 0110   ALT 31 09/04/2020 0110   ALKPHOS 82 09/04/2020 0110   BILITOT 0.6 09/04/2020 0110   BILITOT 0.5 07/23/2020 0842   GFRNONAA >60 09/09/2020 0226   GFRAA >60 08/01/2020 0704     Diabetic Labs (most recent): Lab Results  Component Value Date   HGBA1C 7.1 (H) 08/13/2020   HGBA1C 8.0 (H) 07/29/2020   HGBA1C 8.6 (A) 05/28/2020     Lipid Panel ( most recent) Lipid Panel     Component Value Date/Time   CHOL 210 (H) 07/23/2020 0842   TRIG 86 08/12/2020 1023   HDL 60 07/23/2020 0842   CHOLHDL 3.5 07/23/2020 0842   LDLCALC 116 (H) 07/23/2020 0842   LABVLDL 34 07/23/2020 0842      Lab Results  Component Value Date   TSH 0.635 09/08/2020   TSH 0.993 07/30/2020   TSH 1.990 07/23/2020   TSH 1.690 08/22/2010   FREET4 0.72 (L) 07/23/2020   FREET4 1.11 08/22/2010      Assessment & Plan:   1) Type 2 diabetes mellitus with hyperglycemia, without long-term current use of insulin ([place])   - Kandra Nicolas has currently uncontrolled symptomatic  type 2 DM since  34 years of age.  She presents today, accompanied by her mom (who is helping manage her diabetes), with her meter and logs showing above target fasting and postprandial glycemic profile.  Since last visit, she has started eating more, which has caused her glucose to rebound.  There are no hypoglycemic episodes noted.  She had trouble getting  her Dexcom to pair with her phone, therefore her mom has been performing fingersticks.  - I had a long discussion with her about the progressive nature of diabetes and the pathology behind its complications. -her diabetes is complicated by obesity/sedentary life and she remains at a high risk for more acute and chronic complications which include CAD, CVA, CKD, retinopathy, and neuropathy. These are all discussed in detail with her.  - Nutritional counseling repeated at each appointment due to patients tendency to fall back in to old habits.  - The patient admits there is a room for improvement in their diet and drink choices. -  Suggestion is made for the patient to avoid simple carbohydrates from their diet including Cakes, Sweet Desserts / Pastries, Ice Cream, Soda (diet and regular), Sweet Tea, Candies, Chips, Cookies, Sweet Pastries,  Store Bought Juices, Alcohol in Excess of  1-2 drinks a day, Artificial Sweeteners, Coffee Creamer, and "Sugar-free" Products. This will help patient to have stable blood glucose profile and potentially avoid unintended weight gain.   - I encouraged the patient to switch to  unprocessed or minimally processed complex starch and increased protein intake (animal or plant source), fruits, and vegetables.   - Patient is advised to stick to a routine mealtimes to eat 3 meals  a day and avoid unnecessary snacks ( to snack only to correct hypoglycemia).  - I have approached her with the following individualized plan to manage  her diabetes and patient agrees:   -Since her recent hospitalization, her mother has assumed responsibility for her diabetes management.  Her most recent blood glucose readings are above target.  She is advised to increase her Humulin 70/30 25 units with breakfast and 25 units with supper, when eating and when glucose is above 90.  She is advised to continue Victoza 1.8 mg SQ daily and Metformin 1000 mg po twice daily with meals (renal function  stable).  -She is encouraged to continue using CGM to monitor glucose 4 times daily, before meals and before bed and log on the clinic sheets provided.  They are instructed to call the clinic if she has readings less than 70 or greater than 200 for 3 tests in a row.   - Specific targets for  A1c;  LDL, HDL,  and Triglycerides were discussed with the patient.  2) Blood Pressure /Hypertension: Her blood pressure is controlled to target.  She is advised to continue Lasix 80 mg po daily, continue Lisinopril 5 mg po daily, continue Propanolol 10 mg po TID, and Aldactone 100 mg o daily  3) Lipids/Hyperlipidemia:   Review of her recent lipid profile from 07/23/20 shows uncontrolled LDL at 116 and elevated triglycerides of 197.  She is advised to continue Crestor 5 mg po daily at bedtime.  Side effects and precautions discussed with her.  4)  Weight/Diet:  Her Body mass index is 40.61 kg/m.  -   clearly complicating her diabetes care.  She has recently lost 8 lbs. she is  a candidate for modest weight loss. I discussed with her the fact that loss of 5 - 10% of her  current body weight will have the most impact on her diabetes management.  Exercise, and detailed carbohydrates information provided  -  detailed on discharge instructions.  5) Chronic Care/Health Maintenance: -she is on ACE and statin medications and is encouraged to initiate and continue to follow up with Ophthalmology, Dentist,  Podiatrist at least yearly or according to recommendations, and advised to   stay away from smoking. I have recommended yearly flu vaccine and pneumonia vaccine at least every 5 years; moderate intensity exercise for up to 150 minutes weekly; and  sleep for at least 7 hours a day.  - she is  advised to maintain close follow up with Hasanaj, Samul Dada, MD for primary care needs, as well as her other providers for optimal and coordinated care.   - Time spent on this patient care encounter:  35 min, of which > 50% was  spent in  counseling and the rest reviewing her blood glucose logs , discussing her hypoglycemia and hyperglycemia episodes, reviewing her current and  previous labs / studies  ( including abstraction from other facilities) and medications  doses and developing a  long term treatment plan and documenting her care.   Please refer to Patient Instructions for Blood Glucose Monitoring and Insulin/Medications Dosing Guide"  in media tab for additional information. Please  also refer to " Patient Self Inventory" in the Media  tab for reviewed elements of pertinent patient history.  [facility] participated in the discussions, expressed understanding, and voiced agreement with the above plans.  All questions were answered to her satisfaction. she is encouraged to contact clinic should she have any questions or concerns prior to her return visit.  Follow up plan: - Return in about 3 months (around 12/26/2020) for Diabetes follow up with A1c in office, No previsit labs, Bring glucometer and logs, ABI next visit.  Rayetta Pigg, [facility] [address] [place], [place] [address] Phone: [phone] Fax: [phone]   09/25/2020, 10:52 AM

## 2020-09-25 NOTE — Patient Instructions (Signed)

## 2020-11-06 ENCOUNTER — Ambulatory Visit: Payer: Medicaid Other | Admitting: Nurse Practitioner
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
## 2020-11-15 ENCOUNTER — Telehealth (INDEPENDENT_AMBULATORY_CARE_PROVIDER_SITE_OTHER): Payer: Medicaid Other | Admitting: Psychiatry

## 2020-11-15 ENCOUNTER — Other Ambulatory Visit: Payer: Self-pay

## 2020-11-15 ENCOUNTER — Encounter: Payer: Self-pay | Admitting: Psychiatry

## 2020-11-15 DIAGNOSIS — F330 Major depressive disorder, recurrent, mild: Secondary | ICD-10-CM | POA: Diagnosis not present

## 2020-11-15 DIAGNOSIS — F419 Anxiety disorder, unspecified: Secondary | ICD-10-CM | POA: Diagnosis not present

## 2020-11-15 MED ORDER — CLONAZEPAM 0.5 MG PO TABS: 0.5000 mg | ORAL_TABLET | Freq: Every day | ORAL | 0 refills | Status: DC | PRN

## 2020-11-15 NOTE — Patient Instructions (Signed)
1.Continuevenlafaxine 225 mg daily 2. Increasequetiapine 50 mg at night 3. Decreaseclonazepam 0.5 mg daily as needed for anxiety   4.Next appointment:2/11 at [place]

## 2020-11-27 ENCOUNTER — Ambulatory Visit (INDEPENDENT_AMBULATORY_CARE_PROVIDER_SITE_OTHER): Payer: Medicaid Other | Admitting: Nurse Practitioner

## 2020-11-27 ENCOUNTER — Encounter: Payer: Self-pay | Admitting: Nurse Practitioner

## 2020-11-27 ENCOUNTER — Other Ambulatory Visit: Payer: Self-pay

## 2020-11-27 VITALS — BP 118/73 | HR 106 | Wt 307.0 lb

## 2020-11-27 DIAGNOSIS — E1165 Type 2 diabetes mellitus with hyperglycemia: Secondary | ICD-10-CM

## 2020-11-27 LAB — POCT GLYCOSYLATED HEMOGLOBIN (HGB A1C): HbA1c, POC (controlled diabetic range): 10.1 % — AB (ref 0.0–7.0)

## 2020-11-27 MED ORDER — VICTOZA 18 MG/3ML ~~LOC~~ SOPN: 1.8000 mg | PEN_INJECTOR | Freq: Every day | SUBCUTANEOUS | 3 refills | Status: DC

## 2020-11-27 MED ORDER — METFORMIN HCL 1000 MG PO TABS: 1000.0000 mg | ORAL_TABLET | Freq: Two times a day (BID) | ORAL | 3 refills | Status: DC

## 2020-11-27 MED ORDER — DEXCOM G6 SENSOR MISC: 4.0000 | 2 refills | Status: DC

## 2020-11-27 MED ORDER — DEXCOM G6 TRANSMITTER MISC: 1.0000 | 1 refills | Status: DC

## 2020-11-27 MED ORDER — INSULIN ISOPHANE & REGULAR (HUMAN 70-30)100 UNIT/ML KWIKPEN: 30.0000 [IU] | PEN_INJECTOR | Freq: Two times a day (BID) | SUBCUTANEOUS | 6 refills | Status: DC

## 2020-11-27 NOTE — Progress Notes (Signed)
11/27/2020, 9:47 AM     Endocrinology Follow Up Visit  Subjective:    Patient ID: Jaime [name], female    DOB: Jul 20, 1986.  [facility] is being seen in follow up for management of currently uncontrolled symptomatic diabetes requested by  Neale Burly, MD.   Past Medical History:  Diagnosis Date  . Anxiety   . Asthma   . Chronic abdominal pain   . Chronic back pain   . Cirrhosis ([place])   . Cirrhosis ([place])   . COPD (chronic obstructive pulmonary disease) ([place])   . Depression   . Diabetes mellitus without complication ([place])   . Diabetes mellitus, type II ([place])   . Hepatitis C   . Insomnia   . Long-term current use of methadone for opiate dependence ([place])   . Lupus ([place])   . Migraine headache   . Neuropathy   . Nocturnal seizures ([place])   . Peptic ulcer   . Spleen enlarged     Past Surgical History:  Procedure Laterality Date  . CHOLECYSTECTOMY      Social History   Socioeconomic History  . Marital status: Single    Spouse name: Not on file  . Number of children: Not on file  . Years of education: Not on file  . Highest education level: Not on file  Occupational History  . Not on file  Tobacco Use  . Smoking status: Current Every Day Smoker    Packs/day: 0.00    Years: 5.00    Pack years: 0.00    Types: E-cigarettes  . Smokeless tobacco: Never Used  Vaping Use  . Vaping Use: Every day  . Substances: Nicotine, Flavoring  Substance and Sexual Activity  . Alcohol use: No  . Drug use: No  . Sexual activity: Not Currently    Birth control/protection: Implant  Other Topics Concern  . Not on file  Social History Narrative  . Not on file   Social Determinants of Health   Financial Resource Strain: Medium Risk  . Difficulty of Paying Living Expenses: Somewhat hard  Food Insecurity: No Food Insecurity  . Worried About Charity fundraiser in the Last Year: Never  true  . Ran Out of Food in the Last Year: Never true  Transportation Needs: No Transportation Needs  . Lack of Transportation (Medical): No  . Lack of Transportation (Non-Medical): No  Physical Activity: Insufficiently Active  . Days of Exercise per Week: 1 day  . Minutes of Exercise per Session: 20 min  Stress: Stress Concern Present  . Feeling of Stress : Very much  Social Connections: Moderately Isolated  . Frequency of Communication with Friends and Family: More than three times a week  . Frequency of Social Gatherings with Friends and Family: Twice a week  . Attends Religious Services: More than 4 times per year  . Active Member of Clubs or Organizations: No  . Attends Archivist Meetings: Never  . Marital Status: Never married    Family History  Problem Relation Age of Onset  . Hypertension Mother   . Hyperlipidemia Mother   .  Hyperlipidemia Maternal Grandfather     Outpatient Encounter Medications as of 11/27/2020  Medication Sig  . Accu-Chek Softclix Lancets lancets 4 (four) times daily.  Marland Kitchen albuterol (ACCUNEB) 0.63 MG/3ML nebulizer solution Take by nebulization 4 (four) times daily.  Marland Kitchen albuterol (PROVENTIL) (2.5 MG/3ML) 0.083% nebulizer solution Take 2.5 mg by nebulization every 6 (six) hours as needed for wheezing or shortness of breath.  Marland Kitchen aspirin EC 81 MG tablet Take 162 mg by mouth daily. Swallow whole.  . cetirizine (ZYRTEC ALLERGY) 10 MG tablet Take 1 tablet (10 mg total) by mouth daily.  . clonazePAM (KLONOPIN) 0.5 MG tablet Take 1 tablet (0.5 mg total) by mouth 2 (two) times daily as needed for anxiety.  Derrill Memo ON 12/01/2020] clonazePAM (KLONOPIN) 0.5 MG tablet Take 1 tablet (0.5 mg total) by mouth daily as needed for anxiety.  . Continuous Blood Gluc Sensor (DEXCOM G6 SENSOR) MISC 4 Pieces by Does not apply route once a week.  . Continuous Blood Gluc Transmit (DEXCOM G6 TRANSMITTER) MISC 1 Piece by Does not apply route as directed.  . etonogestrel  (NEXPLANON) 68 MG IMPL implant 1 each by Subdermal route once.  . furosemide (LASIX) 40 MG tablet Take 80 mg by mouth daily.   Marland Kitchen glucose blood (ACCU-CHEK GUIDE) test strip Use as instructed to check blood glucose four times daily  . hyoscyamine (ANASPAZ) 0.125 MG TBDP disintergrating tablet Place 0.125 mg under the tongue 3 (three) times daily as needed for bladder spasms or cramping (Patient Preference).   . insulin isophane & regular human (HUMULIN 70/30 MIX) (70-30) 100 UNIT/ML KwikPen Inject 25 Units into the skin 2 (two) times daily. Inject 90 units with breakfast and 80 units with supper  . Insulin Pen Needle (PEN NEEDLES) 32G X 6 MM MISC 1 each by Does not apply route 3 (three) times daily.  Marland Kitchen lactulose (CHRONULAC) 10 GM/15ML solution Take 30 mLs (20 g total) by mouth 3 (three) times daily.  Marland Kitchen liraglutide (VICTOZA) 18 MG/3ML SOPN Inject 1.8 mg into the skin daily.  Marland Kitchen lisinopril (ZESTRIL) 5 MG tablet Take 1 tablet (5 mg total) by mouth daily.  . metFORMIN (GLUCOPHAGE) 1000 MG tablet Take 1 tablet (1,000 mg total) by mouth 2 (two) times daily with a meal.  . methadone (DOLOPHINE) 10 MG/5ML solution Take 30 mg by mouth every morning.   . Multiple Vitamin (MULTIVITAMIN) tablet Take 1 tablet by mouth daily.  . ondansetron (ZOFRAN-ODT) 4 MG disintegrating tablet Take 4 mg by mouth every 8 (eight) hours as needed for nausea.   . pantoprazole (PROTONIX) 40 MG tablet Take 1 tablet (40 mg total) by mouth daily.  . potassium chloride (KLOR-CON) 10 MEQ tablet Take 20 mEq by mouth daily.   . pregabalin (LYRICA) 75 MG capsule Take 75 mg by mouth in the morning, at noon, in the evening, and at bedtime.   . propranolol (INDERAL) 10 MG tablet Take 10 mg by mouth 3 (three) times daily.  . QUEtiapine (SEROQUEL) 25 MG tablet Take 1 tablet (25 mg total) by mouth at bedtime. (Patient not taking: Reported on 09/25/2020)  . RESTASIS 0.05 % ophthalmic emulsion Place 1 drop into both eyes as needed (dry eye).   .  rosuvastatin (CRESTOR) 5 MG tablet Take 1 tablet (5 mg total) by mouth daily.  Marland Kitchen spironolactone (ALDACTONE) 100 MG tablet Take 100 mg by mouth daily.   . sucralfate (CARAFATE) 1 g tablet Take 1 g by mouth 4 (four) times daily.  Marland Kitchen venlafaxine XR (  EFFEXOR-XR) 150 MG 24 hr capsule Take total of 225 mg daily (150 mg + 75 mg )  . venlafaxine XR (EFFEXOR-XR) 75 MG 24 hr capsule Take 75 mg by mouth daily. To take along with 176m for a total of 225 mg daily   No facility-administered encounter medications on file as of 11/27/2020.    ALLERGIES: Allergies  Allergen Reactions  . Iodine-131 Anaphylaxis, Shortness Of Breath and Swelling  . Ivp Dye [Iodinated Diagnostic Agents] Anaphylaxis    Skin gets very red, unable to walk   . Ketorolac Tromethamine Shortness Of Breath  . Tylenol [Acetaminophen] Other (See Comments)    Due to cirrhosis   . Gabapentin   . Morphine And Related     Pt says she is not allergic to Morphine 08/12/20  . Nsaids Other (See Comments)    Flares ulcers  . Suboxone [Buprenorphine Hcl-Naloxone Hcl]   . Vancomycin Swelling    Facial swelling,     VACCINATION STATUS: Immunization History  Administered Date(s) Administered  . Influenza,inj,Quad PF,6+ Mos 07/18/2016  . Moderna Sars-Covid-2 Vaccination 03/07/2020, 04/04/2020  . Pneumococcal Polysaccharide-23 07/18/2016, 08/01/2020    Diabetes She presents for her initial diabetic visit. She has type 2 diabetes mellitus. Onset time: She was diagnosed at approximate age of 89years. Her disease course has been worsening. Pertinent negatives for hypoglycemia include no headaches, nervousness/anxiousness, pallor, sweats or tremors. Associated symptoms include fatigue, foot paresthesias, polydipsia and polyuria. Pertinent negatives for diabetes include no chest pain, no polyphagia and no weight loss. There are no hypoglycemic complications. (History of unresponsiveness requiring EMS and hospitalization) Symptoms are worsening.  Diabetic complications include heart disease and peripheral neuropathy. Risk factors for coronary artery disease include diabetes mellitus, obesity, sedentary lifestyle, dyslipidemia and hypertension. Current diabetic treatment includes insulin injections and oral agent (monotherapy). She is compliant with treatment most of the time. Her weight is increasing rapidly. She is following a low salt and generally unhealthy diet. When asked about meal planning, she reported none. She has had a previous visit with a dietitian. She participates in exercise intermittently. Her home blood glucose trend is increasing steadily. Her breakfast blood glucose range is generally >200 mg/dl. Her lunch blood glucose range is generally >200 mg/dl. Her dinner blood glucose range is generally >200 mg/dl. Her bedtime blood glucose range is generally >200 mg/dl. Her overall blood glucose range is >200 mg/dl. (She presents today, accompanied by her mother, with her CGM and logs showing persistently high glycemic profile, both fasting and postprandially.  She saw her PCP yesterday for frequent falls and mentioned her glucose has been running high, so he increased her 70/30 to 30 units TID and increased the frequency of her Victoza to 1.8 mg SQ twice daily.  They have not yet followed those instructions, [place]  Her POCT A1c today is 10.1% worsening from last visit of 7.1%.  She has recovered well from her recent hospitalization, is able to eat without difficulty and move more freely now.  She denies any episodes of hypoglycemia.  Analysis of her CGM shows TIR 1%, TAR 99% (89 % being very high), TBR 0%.  She admits to drinking sugary beverages and eating popsicles more lately.) An ACE inhibitor/angiotensin II receptor blocker is being taken. She does not see a podiatrist.Eye exam is not current.  Hypertension This is a new problem. The current episode started more than 1 month ago. The problem is unchanged. The problem is  uncontrolled. Associated symptoms include peripheral  edema. Pertinent negatives include no chest pain, headaches, palpitations, shortness of breath or sweats. There are no associated agents to hypertension. Risk factors for coronary artery disease include diabetes mellitus, dyslipidemia, obesity and sedentary lifestyle. Past treatments include diuretics. The current treatment provides no improvement. There are no compliance problems.      Review of systems  Constitutional: + drastically increasing weight,  current Body mass index is 45.34 kg/m. , + fatigue, no subjective hyperthermia, no subjective hypothermia,  Eyes: + blurry vision, no xerophthalmia ENT: no sore throat, no nodules palpated in throat, no dysphagia/odynophagia, no hoarseness Cardiovascular: no chest pain, no shortness of breath, no palpitations, no swelling Respiratory: no cough, no SOB Gastrointestinal: no nausea/vomiting/diarrhea Musculoskeletal: no muscle/joint aches, + minimal BUE weakness r/t recent hospitalization for encephalopathy  Skin: no rashes, no hyperemia Neurological: no tremors, no numbness, no tingling, no dizziness Psychiatric: no depression, no anxiety  Objective:     Wt (!) 307 lb (139.3 kg)   BMI 45.34 kg/m   Wt Readings from Last 3 Encounters:  11/27/20 (!) 307 lb (139.3 kg)  09/25/20 275 lb (124.7 kg)  09/10/20 283 lb (128.4 kg)    BP Readings from Last 3 Encounters:  09/25/20 115/76  09/10/20 128/75  09/09/20 119/62     Physical Exam- Limited  Constitutional:  Body mass index is 45.34 kg/m. , not in acute distress, normal state of mind Eyes:  EOMI, no exophthalmos Neck: Supple Cardiovascular: RRR, no murmers, rubs, or gallops, no edema Respiratory: Adequate breathing efforts, no crackles, rales, rhonchi, or wheezing Musculoskeletal: no gross deformities, strength intact in all four extremities, no gross restriction of joint movements Skin:  no rashes, no hyperemia Neurological: no  tremor with outstretched hands    CMP ( most recent) CMP     Component Value Date/Time   NA 142 09/09/2020 0226   NA 143 07/23/2020 0842   K 4.3 09/09/2020 0226   CL 106 09/09/2020 0226   CO2 27 09/09/2020 0226   GLUCOSE 125 (H) 09/09/2020 0226   BUN 11 09/09/2020 0226   BUN 8 07/23/2020 0842   CREATININE 0.63 09/09/2020 0226   CALCIUM 8.7 (L) 09/09/2020 0226   PROT 6.1 (L) 09/04/2020 0110   PROT 6.3 07/23/2020 0842   ALBUMIN 2.4 (L) 09/04/2020 0110   ALBUMIN 3.3 (L) 07/23/2020 0842   AST 46 (H) 09/04/2020 0110   ALT 31 09/04/2020 0110   ALKPHOS 82 09/04/2020 0110   BILITOT 0.6 09/04/2020 0110   BILITOT 0.5 07/23/2020 0842   GFRNONAA >60 09/09/2020 0226   GFRAA >60 08/01/2020 0704     Diabetic Labs (most recent): Lab Results  Component Value Date   HGBA1C 7.1 (H) 08/13/2020   HGBA1C 8.0 (H) 07/29/2020   HGBA1C 8.6 (A) 05/28/2020     Lipid Panel ( most recent) Lipid Panel     Component Value Date/Time   CHOL 210 (H) 07/23/2020 0842   TRIG 86 08/12/2020 1023   HDL 60 07/23/2020 0842   CHOLHDL 3.5 07/23/2020 0842   LDLCALC 116 (H) 07/23/2020 0842   LABVLDL 34 07/23/2020 0842      Lab Results  Component Value Date   TSH 0.635 09/08/2020   TSH 0.993 07/30/2020   TSH 1.990 07/23/2020   TSH 1.690 08/22/2010   FREET4 0.72 (L) 07/23/2020   FREET4 1.11 08/22/2010      Assessment & Plan:   1) Type 2 diabetes mellitus with hyperglycemia, without long-term current use of insulin (HCC)  - Jaime Pina  Dawn [name] has currently uncontrolled symptomatic type 2 DM since  35 years of age.  She presents today, accompanied by her mother, with her CGM and logs showing persistently high glycemic profile, both fasting and postprandially.  She saw her PCP yesterday for frequent falls and mentioned her glucose has been running high, so he increased her 70/30 to 30 units TID and increased the frequency of her Victoza to 1.8 mg SQ twice daily.  They have not yet followed those  instructions, wanted to see [place] first.  Her POCT A1c today is 10.1% worsening from last visit of 7.1%.  She has recovered well from her recent hospitalization, is able to eat without difficulty and move more freely now.  She denies any episodes of hypoglycemia.  Analysis of her CGM shows TIR 1%, TAR 99% (89 % being very high), TBR 0%.  She admits to drinking sugary beverages and eating popsicles more lately.  - I had a long discussion with her about the progressive nature of diabetes and the pathology behind its complications.  -her diabetes is complicated by obesity/sedentary life and she remains at a high risk for more acute and chronic complications which include CAD, CVA, CKD, retinopathy, and neuropathy. These are all discussed in detail with her.  - Nutritional counseling repeated at each appointment due to patients tendency to fall back in to old habits.  - The patient admits there is a room for improvement in their diet and drink choices. -  Suggestion is made for the patient to avoid simple carbohydrates from their diet including Cakes, Sweet Desserts / Pastries, Ice Cream, Soda (diet and regular), Sweet Tea, Candies, Chips, Cookies, Sweet Pastries,  Store Bought Juices, Alcohol in Excess of  1-2 drinks a day, Artificial Sweeteners, Coffee Creamer, and "Sugar-free" Products. This will help patient to have stable blood glucose profile and potentially avoid unintended weight gain.   - I encouraged the patient to switch to  unprocessed or minimally processed complex starch and increased protein intake (animal or plant source), fruits, and vegetables.   - Patient is advised to stick to a routine mealtimes to eat 3 meals  a day and avoid unnecessary snacks ( to snack only to correct hypoglycemia).  - I have approached her with the following individualized plan to manage  her diabetes and patient agrees:   -Since her recent hospitalization, her mother has assumed responsibility for her diabetes  management.  Number 1 priority will be to avoid hypoglycemia for her given her comorbidities.  -Given her significantly high glycemic profile, she will tolerate increase in her 70/30 to 30 units with breakfast and supper if glucose is above 90 and she is eating (along with strict dietary changes to avoid sweetened beverages).  She is advised to continue current dose of Victoza 1.8 mg SQ daily and continue Metformin 1000 mg po twice daily with meals.  -She is encouraged to continue using CGM to monitor glucose 4 times daily, before meals and before bed and log on the clinic sheets provided.  They are instructed to call the clinic if she has readings less than 70 or greater than 300 for 3 tests in a row.   - Specific targets for  A1c;  LDL, HDL,  and Triglycerides were discussed with the patient.  2) Blood Pressure /Hypertension: Her blood pressure is controlled to target.  She is advised to continue Lasix 80 mg po daily, continue Lisinopril 5 mg po daily, continue Propanolol 10 mg po TID, and Aldactone  100 mg o daily  3) Lipids/Hyperlipidemia:   Review of her recent lipid profile from 07/23/20 shows uncontrolled LDL at 116 and elevated triglycerides of 197.  She is advised to continue Crestor 5 mg po daily at bedtime.  Side effects and precautions discussed with her.  4)  Weight/Diet:  Her Body mass index is 45.34 kg/m.  -   clearly complicating her diabetes care.  She has recently lost 8 lbs. she is  a candidate for modest weight loss. I discussed with her the fact that loss of 5 - 10% of her  current body weight will have the most impact on her diabetes management.  Exercise, and detailed carbohydrates information provided  -  detailed on discharge instructions.  5) Chronic Care/Health Maintenance: -she is on ACE and statin medications and is encouraged to initiate and continue to follow up with Ophthalmology, Dentist,  Podiatrist at least yearly or according to recommendations, and advised to    stay away from smoking. I have recommended yearly flu vaccine and pneumonia vaccine at least every 5 years; moderate intensity exercise for up to 150 minutes weekly; and  sleep for at least 7 hours a day.  - she is  advised to maintain close follow up with Hasanaj, Samul Dada, MD for primary care needs, as well as her other providers for optimal and coordinated care.   - Time spent on this patient care encounter:  35 min, of which > 50% was spent in  counseling and the rest reviewing her blood glucose logs , discussing her hypoglycemia and hyperglycemia episodes, reviewing her current and  previous labs / studies  ( including abstraction from other facilities) and medications  doses and developing a  long term treatment plan and documenting her care.   Please refer to Patient Instructions for Blood Glucose Monitoring and Insulin/Medications Dosing Guide"  in media tab for additional information. Please  also refer to " Patient Self Inventory" in the Media  tab for reviewed elements of pertinent patient history.  [facility] participated in the discussions, expressed understanding, and voiced agreement with the above plans.  All questions were answered to her satisfaction. she is encouraged to contact clinic should she have any questions or concerns prior to her return visit.  Follow up plan: - No follow-ups on file.  Rayetta Pigg, [facility] [address] [place], [place] [address] Phone: [phone] Fax: [phone]   11/27/2020, 9:47 AM

## 2020-11-27 NOTE — Patient Instructions (Signed)

## 2020-12-05 ENCOUNTER — Other Ambulatory Visit (HOSPITAL_COMMUNITY): Payer: Self-pay | Admitting: Orthopedic Surgery

## 2020-12-05 ENCOUNTER — Other Ambulatory Visit: Payer: Self-pay | Admitting: Orthopedic Surgery

## 2020-12-05 DIAGNOSIS — M25562 Pain in left knee: Secondary | ICD-10-CM
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
## 2020-12-13 ENCOUNTER — Other Ambulatory Visit: Payer: Self-pay

## 2020-12-13 ENCOUNTER — Telehealth: Payer: Medicaid Other | Admitting: Psychiatry

## 2020-12-13 ENCOUNTER — Telehealth: Payer: Self-pay | Admitting: Psychiatry

## 2020-12-13 NOTE — Telephone Encounter (Signed)
Sent link for video visit through [place]. Patient did not sign in. Called the patient twice for appointment scheduled today. The patient did not answer the phone. No option to leave a voice message.

## 2020-12-25 ENCOUNTER — Ambulatory Visit (HOSPITAL_COMMUNITY)
Admission: RE | Admit: 2020-12-25 | Discharge: 2020-12-25 | Disposition: A | Discharge status: Home / Self Care | Payer: Medicaid Other | Source: Ambulatory Visit | Attending: Orthopedic Surgery | Admitting: Orthopedic Surgery

## 2020-12-25 DIAGNOSIS — M25562 Pain in left knee: Secondary | ICD-10-CM | POA: Diagnosis present

## 2021-01-01 ENCOUNTER — Ambulatory Visit: Payer: Medicaid Other | Admitting: Nurse Practitioner

## 2021-01-20 ENCOUNTER — Other Ambulatory Visit: Payer: Self-pay | Admitting: Psychiatry

## 2021-01-20 ENCOUNTER — Telehealth (HOSPITAL_COMMUNITY): Payer: Self-pay

## 2021-01-20 MED ORDER — VENLAFAXINE HCL ER 75 MG PO CP24: ORAL_CAPSULE | ORAL | 0 refills | Status: DC

## 2021-01-20 NOTE — Telephone Encounter (Signed)
pt called  states she is completely out of the klonpin pt has appt form 01-24-21

## 2021-01-20 NOTE — Telephone Encounter (Signed)
According to the database, she filled clonazepam on 01/15/2021, ordered from other provider. I will not be able to do any refill at this time.
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
## 2021-01-20 NOTE — Telephone Encounter (Signed)
Refill ordered. Please contact the patient to have follow up appointment (for 30 mins). Will not be able to do anymore refills without evaluation.

## 2021-01-20 NOTE — Telephone Encounter (Signed)
Medication refill request - Fax from pt's Jaime [name] for a refill of her Venlafaxine ER 75 mg which pt' takes 1 a day with her 150 mg ER dosage. Last filled 10/23/20 for 90 days. Pt. no showed appt 12/13/20 and no current appt scheduled.

## 2021-01-21 ENCOUNTER — Telehealth (HOSPITAL_COMMUNITY): Payer: Self-pay

## 2021-01-21 ENCOUNTER — Other Ambulatory Visit: Payer: Self-pay | Admitting: Psychiatry

## 2021-01-21 MED ORDER — QUETIAPINE FUMARATE 50 MG PO TABS
50.0000 mg | ORAL_TABLET | Freq: Every day | ORAL | 0 refills | Status: DC
Start: 2021-01-21 — End: 2021-01-24

## 2021-01-21 NOTE — Telephone Encounter (Signed)
Ordered for a month.

## 2021-01-21 NOTE — Telephone Encounter (Signed)
Medication refill request - Fax from pt's [place] for a refill of her Quetiapine, last ordered 10/16/20 for 90 days and last filled 11/25/20. Patient's next appt 01/24/21

## 2021-01-24 ENCOUNTER — Encounter: Payer: Self-pay | Admitting: Psychiatry

## 2021-01-24 ENCOUNTER — Telehealth (INDEPENDENT_AMBULATORY_CARE_PROVIDER_SITE_OTHER): Payer: Medicaid Other | Admitting: Psychiatry

## 2021-01-24 ENCOUNTER — Other Ambulatory Visit: Payer: Self-pay

## 2021-01-24 DIAGNOSIS — F419 Anxiety disorder, unspecified: Secondary | ICD-10-CM

## 2021-01-24 DIAGNOSIS — F331 Major depressive disorder, recurrent, moderate: Secondary | ICD-10-CM

## 2021-01-24 MED ORDER — VENLAFAXINE HCL ER 150 MG PO CP24
ORAL_CAPSULE | ORAL | 0 refills | Status: DC
Start: 2021-01-24 — End: 2023-02-02

## 2021-01-24 MED ORDER — ARIPIPRAZOLE 2 MG PO TABS: 2.0000 mg | ORAL_TABLET | Freq: Every day | ORAL | 0 refills | Status: DC

## 2021-01-24 NOTE — Patient Instructions (Signed)
1.Continuevenlafaxine 225 mg daily 2. Discontinue Quetiapine 3. Start Abilify 2 mg daily  4. Discontinue clonazepam  5. Next appointment:4/22 at 9:20

## 2021-02-03 ENCOUNTER — Ambulatory Visit: Payer: Medicaid Other | Admitting: Nurse Practitioner

## 2021-02-05 ENCOUNTER — Other Ambulatory Visit: Payer: Self-pay

## 2021-02-05 ENCOUNTER — Encounter: Payer: Self-pay | Admitting: Nurse Practitioner

## 2021-02-05 ENCOUNTER — Ambulatory Visit (INDEPENDENT_AMBULATORY_CARE_PROVIDER_SITE_OTHER): Payer: Medicaid Other | Admitting: Nurse Practitioner

## 2021-02-05 VITALS — BP 109/72 | HR 105 | Ht 69.0 in | Wt 281.2 lb

## 2021-02-05 DIAGNOSIS — Z91119 Patient's noncompliance with dietary regimen due to unspecified reason: Secondary | ICD-10-CM

## 2021-02-05 DIAGNOSIS — Z9111 Patient's noncompliance with dietary regimen: Secondary | ICD-10-CM | POA: Diagnosis not present

## 2021-02-05 DIAGNOSIS — E1165 Type 2 diabetes mellitus with hyperglycemia: Secondary | ICD-10-CM | POA: Diagnosis not present

## 2021-02-05 DIAGNOSIS — E782 Mixed hyperlipidemia: Secondary | ICD-10-CM | POA: Diagnosis not present

## 2021-02-05 MED ORDER — DEXCOM G6 TRANSMITTER MISC
1.0000 | 1 refills | Status: DC
Start: 2021-02-05 — End: 2021-12-08

## 2021-02-05 MED ORDER — DEXCOM G6 SENSOR MISC: 4.0000 | 2 refills | Status: DC

## 2021-02-05 MED ORDER — INSULIN ISOPHANE & REGULAR (HUMAN 70-30)100 UNIT/ML KWIKPEN: 45.0000 [IU] | PEN_INJECTOR | Freq: Two times a day (BID) | SUBCUTANEOUS | 6 refills | Status: DC

## 2021-02-05 NOTE — Progress Notes (Signed)
02/05/2021, 4:27 PM     Endocrinology Follow Up Visit  Subjective:    Patient ID: Jaime [name], female    DOB: 1985/11/25.  [facility] is being seen in follow up for management of currently uncontrolled symptomatic diabetes requested by  Neale Burly, MD.   Past Medical History:  Diagnosis Date  . Anxiety   . Asthma   . Chronic abdominal pain   . Chronic back pain   . Cirrhosis ([place])   . Cirrhosis ([place])   . COPD (chronic obstructive pulmonary disease) ([place])   . Depression   . Diabetes mellitus without complication ([place])   . Diabetes mellitus, type II ([place])   . Hepatitis C   . Insomnia   . Long-term current use of methadone for opiate dependence ([place])   . Lupus ([place])   . Migraine headache   . Neuropathy   . Nocturnal seizures ([place])   . Peptic ulcer   . Spleen enlarged     Past Surgical History:  Procedure Laterality Date  . CHOLECYSTECTOMY      Social History   Socioeconomic History  . Marital status: Single    Spouse name: Not on file  . Number of children: Not on file  . Years of education: Not on file  . Highest education level: Not on file  Occupational History  . Not on file  Tobacco Use  . Smoking status: Current Every Day Smoker    Packs/day: 0.00    Years: 5.00    Pack years: 0.00    Types: E-cigarettes  . Smokeless tobacco: Never Used  Vaping Use  . Vaping Use: Every day  . Substances: Nicotine, Flavoring  Substance and Sexual Activity  . Alcohol use: No  . Drug use: No  . Sexual activity: Not Currently    Birth control/protection: Implant  Other Topics Concern  . Not on file  Social History Narrative  . Not on file   Social Determinants of Health   Financial Resource Strain: Medium Risk  . Difficulty of Paying Living Expenses: Somewhat hard  Food Insecurity: No Food Insecurity  . Worried About Charity fundraiser in the Last Year: Never  true  . Ran Out of Food in the Last Year: Never true  Transportation Needs: No Transportation Needs  . Lack of Transportation (Medical): No  . Lack of Transportation (Non-Medical): No  Physical Activity: Insufficiently Active  . Days of Exercise per Week: 1 day  . Minutes of Exercise per Session: 20 min  Stress: Stress Concern Present  . Feeling of Stress : Very much  Social Connections: Moderately Isolated  . Frequency of Communication with Friends and Family: More than three times a week  . Frequency of Social Gatherings with Friends and Family: Twice a week  . Attends Religious Services: More than 4 times per year  . Active Member of Clubs or Organizations: No  . Attends Archivist Meetings: Never  . Marital Status: Never married    Family History  Problem Relation Age of Onset  . Hypertension Mother   . Hyperlipidemia Mother   .  Hyperlipidemia Maternal Grandfather     Outpatient Encounter Medications as of 02/05/2021  Medication Sig  . Accu-Chek Softclix Lancets lancets 4 (four) times daily.  Marland Kitchen albuterol (ACCUNEB) 0.63 MG/3ML nebulizer solution Take by nebulization 4 (four) times daily.  Marland Kitchen albuterol (PROVENTIL) (2.5 MG/3ML) 0.083% nebulizer solution Take 2.5 mg by nebulization every 6 (six) hours as needed for wheezing or shortness of breath.  . ARIPiprazole (ABILIFY) 2 MG tablet Take 1 tablet (2 mg total) by mouth daily.  Marland Kitchen aspirin EC 81 MG tablet Take 162 mg by mouth daily. Swallow whole.  . cetirizine (ZYRTEC ALLERGY) 10 MG tablet Take 1 tablet (10 mg total) by mouth daily.  Marland Kitchen etonogestrel (NEXPLANON) 68 MG IMPL implant 1 each by Subdermal route once.  . furosemide (LASIX) 40 MG tablet Take 80 mg by mouth daily.   Marland Kitchen glucose blood (ACCU-CHEK GUIDE) test strip Use as instructed to check blood glucose four times daily  . hyoscyamine (ANASPAZ) 0.125 MG TBDP disintergrating tablet Place 0.125 mg under the tongue 3 (three) times daily as needed for bladder spasms or  cramping (Patient Preference).   . Insulin Pen Needle (PEN NEEDLES) 32G X 6 MM MISC 1 each by Does not apply route 3 (three) times daily.  Marland Kitchen lactulose (CHRONULAC) 10 GM/15ML solution Take 30 mLs (20 g total) by mouth 3 (three) times daily.  Marland Kitchen liraglutide (VICTOZA) 18 MG/3ML SOPN Inject 1.8 mg into the skin daily.  Marland Kitchen lisinopril (ZESTRIL) 5 MG tablet Take 1 tablet (5 mg total) by mouth daily.  . metFORMIN (GLUCOPHAGE) 1000 MG tablet Take 1 tablet (1,000 mg total) by mouth 2 (two) times daily with a meal.  . methadone (DOLOPHINE) 10 MG/5ML solution Take 30 mg by mouth every morning. 70m  . Multiple Vitamin (MULTIVITAMIN) tablet Take 1 tablet by mouth daily.  . ondansetron (ZOFRAN-ODT) 4 MG disintegrating tablet Take 4 mg by mouth every 8 (eight) hours as needed for nausea.   . pantoprazole (PROTONIX) 40 MG tablet Take 1 tablet (40 mg total) by mouth daily.  . potassium chloride (KLOR-CON) 10 MEQ tablet Take 20 mEq by mouth daily.   . pregabalin (LYRICA) 75 MG capsule Take 75 mg by mouth in the morning, at noon, in the evening, and at bedtime.   . propranolol (INDERAL) 10 MG tablet Take 10 mg by mouth 3 (three) times daily.  . RESTASIS 0.05 % ophthalmic emulsion Place 1 drop into both eyes as needed (dry eye).   . rosuvastatin (CRESTOR) 5 MG tablet Take 1 tablet (5 mg total) by mouth daily.  .Marland Kitchenspironolactone (ALDACTONE) 100 MG tablet Take 100 mg by mouth daily.   . sucralfate (CARAFATE) 1 g tablet Take 1 g by mouth 4 (four) times daily.  .Marland Kitchentriamcinolone (KENALOG) 0.1 % Apply topically daily.  .Marland Kitchenvenlafaxine XR (EFFEXOR-XR) 150 MG 24 hr capsule Take total of 225 mg daily (150 mg + 75 mg )  . venlafaxine XR (EFFEXOR-XR) 75 MG 24 hr capsule To take along with 1536mfor a total of 225 mg daily  . [DISCONTINUED] Continuous Blood Gluc Sensor (DEXCOM G6 SENSOR) MISC 4 Pieces by Does not apply route once a week.  . [DISCONTINUED] Continuous Blood Gluc Transmit (DEXCOM G6 TRANSMITTER) MISC 1 Piece by Does  not apply route as directed.  . [DISCONTINUED] insulin isophane & regular human (HUMULIN 70/30 MIX) (70-30) 100 UNIT/ML KwikPen Inject 30 Units into the skin 2 (two) times daily. Inject 90 units with breakfast and 80 units with supper (Patient  taking differently: Inject 30 Units into the skin 2 (two) times daily.)  . clonazePAM (KLONOPIN) 0.5 MG tablet Take 1 tablet (0.5 mg total) by mouth 2 (two) times daily as needed for anxiety.  . Continuous Blood Gluc Sensor (DEXCOM G6 SENSOR) MISC 4 Pieces by Does not apply route once a week.  . Continuous Blood Gluc Transmit (DEXCOM G6 TRANSMITTER) MISC 1 Piece by Does not apply route as directed.  . insulin isophane & regular human (HUMULIN 70/30 MIX) (70-30) 100 UNIT/ML KwikPen Inject 45 Units into the skin 2 (two) times daily. Inject 90 units with breakfast and 80 units with supper   No facility-administered encounter medications on file as of 02/05/2021.    ALLERGIES: Allergies  Allergen Reactions  . Iodine-131 Anaphylaxis, Shortness Of Breath and Swelling  . Ivp Dye [Iodinated Diagnostic Agents] Anaphylaxis    Skin gets very red, unable to walk   . Ketorolac Tromethamine Shortness Of Breath  . Tylenol [Acetaminophen] Other (See Comments)    Due to cirrhosis   . Gabapentin   . Morphine And Related     Pt says she is not allergic to Morphine 08/12/20  . Nsaids Other (See Comments)    Flares ulcers  . Suboxone [Buprenorphine Hcl-Naloxone Hcl]   . Vancomycin Swelling    Facial swelling,     VACCINATION STATUS: Immunization History  Administered Date(s) Administered  . Influenza,inj,Quad PF,6+ Mos 07/18/2016  . Moderna Sars-Covid-2 Vaccination 03/07/2020, 04/04/2020  . Pneumococcal Polysaccharide-23 07/18/2016, 08/01/2020    Diabetes She presents for her follow-up diabetic visit. She has type 2 diabetes mellitus. Onset time: She was diagnosed at approximate age of 65 years. Her disease course has been worsening. Pertinent negatives for  hypoglycemia include no headaches, nervousness/anxiousness, pallor, sweats or tremors. Associated symptoms include blurred vision, fatigue, foot paresthesias, polydipsia, polyuria and weight loss. Pertinent negatives for diabetes include no chest pain and no polyphagia. There are no hypoglycemic complications. (History of unresponsiveness requiring EMS and hospitalization) Symptoms are worsening. Diabetic complications include heart disease and peripheral neuropathy. Risk factors for coronary artery disease include diabetes mellitus, obesity, sedentary lifestyle, dyslipidemia and hypertension. Current diabetic treatment includes insulin injections and oral agent (monotherapy). She is compliant with treatment most of the time. Her weight is fluctuating dramatically. She is following a low salt and generally unhealthy diet. When asked about meal planning, she reported none. She has had a previous visit with a dietitian. She participates in exercise intermittently. Her home blood glucose trend is increasing steadily. Her breakfast blood glucose range is generally >200 mg/dl. Her lunch blood glucose range is generally >200 mg/dl. Her dinner blood glucose range is generally >200 mg/dl. Her bedtime blood glucose range is generally >200 mg/dl. Her overall blood glucose range is >200 mg/dl. (She presents today, accompanied by her mother, with her logs showing persistently high glycemic profile both fasting and postprandially.  Her mom came by on Monday with concerns that Jaime [name] is not following her diet seriously because she is constantly drinking sweet tea, consuming ice cream, etc.  She ran out of Dexcom sensors since last visit and did not call to get them refilled.  She saw her PCP early last week for left leg skin problem with itching and he attempted to increase her premixed insulin to 50 units TID.  They wanted to wait and get my advice before increasing that much.) An ACE inhibitor/angiotensin II receptor blocker is  being taken. She does not see a podiatrist.Eye exam is not current.  Hypertension  This is a chronic problem. The current episode started more than 1 month ago. The problem is unchanged. The problem is controlled. Associated symptoms include blurred vision and peripheral edema. Pertinent negatives include no chest pain, headaches, palpitations, shortness of breath or sweats. There are no associated agents to hypertension. Risk factors for coronary artery disease include diabetes mellitus, dyslipidemia, obesity and sedentary lifestyle. Past treatments include diuretics and beta blockers. The current treatment provides no improvement. There are no compliance problems.      Review of systems  Constitutional: + drastically fluctuating weight,  current Body mass index is 41.53 kg/m. , + fatigue, no subjective hyperthermia, no subjective hypothermia,  Eyes: + blurry vision, no xerophthalmia ENT: no sore throat, no nodules palpated in throat, no dysphagia/odynophagia, no hoarseness Cardiovascular: no chest pain, no shortness of breath, no palpitations, no swelling Respiratory: no cough, no SOB Gastrointestinal: no nausea/vomiting/diarrhea Musculoskeletal: no muscle/joint aches Skin: + rash to LLE (on steroid cream), no hyperemia Neurological: no tremors, no numbness, no tingling, no dizziness Psychiatric: no depression, no anxiety  Objective:     BP 109/72 (BP Location: Left Arm, Patient Position: Sitting)   Pulse (!) 105   Ht 5' 9"  (1.753 m)   Wt 281 lb 3.2 oz (127.6 kg)   BMI 41.53 kg/m   Wt Readings from Last 3 Encounters:  02/05/21 281 lb 3.2 oz (127.6 kg)  11/27/20 (!) 307 lb (139.3 kg)  09/25/20 275 lb (124.7 kg)    BP Readings from Last 3 Encounters:  02/05/21 109/72  11/27/20 118/73  09/25/20 115/76    Physical Exam- Limited  Constitutional:  Body mass index is 41.53 kg/m. , not in acute distress, normal state of mind Eyes:  EOMI, no exophthalmos Neck:  Supple Cardiovascular: RRR, no murmers, rubs, or gallops, no edema Respiratory: Adequate breathing efforts, no crackles, rales, rhonchi, or wheezing Musculoskeletal: no gross deformities, strength intact in all four extremities, no gross restriction of joint movements Skin:  + rash to LLE (has steroid cream she has been applying), no hyperemia Neurological: no tremor with outstretched hands    CMP ( most recent) CMP     Component Value Date/Time   NA 142 09/09/2020 0226   NA 143 07/23/2020 0842   K 4.3 09/09/2020 0226   CL 106 09/09/2020 0226   CO2 27 09/09/2020 0226   GLUCOSE 125 (H) 09/09/2020 0226   BUN 11 09/09/2020 0226   BUN 8 07/23/2020 0842   CREATININE 0.63 09/09/2020 0226   CALCIUM 8.7 (L) 09/09/2020 0226   PROT 6.1 (L) 09/04/2020 0110   PROT 6.3 07/23/2020 0842   ALBUMIN 2.4 (L) 09/04/2020 0110   ALBUMIN 3.3 (L) 07/23/2020 0842   AST 46 (H) 09/04/2020 0110   ALT 31 09/04/2020 0110   ALKPHOS 82 09/04/2020 0110   BILITOT 0.6 09/04/2020 0110   BILITOT 0.5 07/23/2020 0842   GFRNONAA >60 09/09/2020 0226   GFRAA >60 08/01/2020 0704     Diabetic Labs (most recent): Lab Results  Component Value Date   HGBA1C 10.1 (A) 11/27/2020   HGBA1C 7.1 (H) 08/13/2020   HGBA1C 8.0 (H) 07/29/2020     Lipid Panel ( most recent) Lipid Panel     Component Value Date/Time   CHOL 210 (H) 07/23/2020 0842   TRIG 86 08/12/2020 1023   HDL 60 07/23/2020 0842   CHOLHDL 3.5 07/23/2020 0842   LDLCALC 116 (H) 07/23/2020 0842   LABVLDL 34 07/23/2020 0842      Lab Results  Component Value Date   TSH 0.635 09/08/2020   TSH 0.993 07/30/2020   TSH 1.990 07/23/2020   TSH 1.690 08/22/2010   FREET4 0.72 (L) 07/23/2020   FREET4 1.11 08/22/2010      Assessment & Plan:   1) Type 2 diabetes mellitus with hyperglycemia, without long-term current use of insulin ([place])  - Jaime [name] has currently uncontrolled symptomatic type 2 DM since  35 years of age.  She presents  today, accompanied by her mother, with her logs showing persistently high glycemic profile both fasting and postprandially.  Her mom came by on Monday with concerns that Bonney is not following her diet seriously because she is constantly drinking sweet tea, consuming ice cream, etc.  She ran out of Dexcom sensors since last visit and did not call to get them refilled.  She saw her PCP early last week for left leg skin problem with itching and he attempted to increase her premixed insulin to 50 units TID.  They wanted to wait and get my advice before increasing that much.  - I had a long discussion with her about the progressive nature of diabetes and the pathology behind its complications.  -her diabetes is complicated by obesity/sedentary life and she remains at a high risk for more acute and chronic complications which include CAD, CVA, CKD, retinopathy, and neuropathy. These are all discussed in detail with her.  - Nutritional counseling repeated at each appointment due to patients tendency to fall back in to old habits.  - The patient admits there is a room for improvement in their diet and drink choices. -  Suggestion is made for the patient to avoid simple carbohydrates from their diet including Cakes, Sweet Desserts / Pastries, Ice Cream, Soda (diet and regular), Sweet Tea, Candies, Chips, Cookies, Sweet Pastries,  Store Bought Juices, Alcohol in Excess of  1-2 drinks a day, Artificial Sweeteners, Coffee Creamer, and "Sugar-free" Products. This will help patient to have stable blood glucose profile and potentially avoid unintended weight gain.   - I encouraged the patient to switch to  unprocessed or minimally processed complex starch and increased protein intake (animal or plant source), fruits, and vegetables.   - Patient is advised to stick to a routine mealtimes to eat 3 meals  a day and avoid unnecessary snacks ( to snack only to correct hypoglycemia).  - I have approached her with the  following individualized plan to manage  her diabetes and patient agrees:   -Since her recent hospitalization, her mother has assumed responsibility for her diabetes management.  Number 1 priority will be to avoid hypoglycemia for her given her comorbidities.  She needs to reach back out to [place] for more diet education.  -Given her significantly high glycemic profile, she will tolerate increase in her 70/30 to 45 units with breakfast and supper if glucose is above 90 and she is eating (along with strict dietary changes to avoid sweetened beverages).  She is advised to continue current dose of Victoza 1.8 mg SQ daily and continue Metformin 1000 mg po twice daily with meals.  -She is encouraged to continue using CGM to monitor glucose 4 times daily, before meals and before bed and log on the clinic sheets provided.  They are instructed to call the clinic if she has readings less than 70 or greater than 300 for 3 tests in a row.   - Specific targets for  A1c;  LDL, HDL,  and Triglycerides were discussed with the patient.  2) Blood Pressure /Hypertension: Her blood pressure is controlled to target.  She is advised to continue Lasix 80 mg po daily, continue Lisinopril 5 mg po daily, continue Propanolol 10 mg po TID, and Aldactone 100 mg o daily  3) Lipids/Hyperlipidemia:   Review of her recent lipid profile from 07/23/20 shows uncontrolled LDL at 116 and elevated triglycerides of 197.  She is advised to continue Crestor 5 mg po daily at bedtime.  Side effects and precautions discussed with her.  4)  Weight/Diet:  Her Body mass index is 41.53 kg/m.  -   clearly complicating her diabetes care.  She has recently lost 8 lbs. she is  a candidate for modest weight loss. I discussed with her the fact that loss of 5 - 10% of her  current body weight will have the most impact on her diabetes management.  Exercise, and detailed carbohydrates information provided  -  detailed on discharge instructions.  5)  Chronic Care/Health Maintenance: -she is on ACE and statin medications and is encouraged to initiate and continue to follow up with Ophthalmology, Dentist,  Podiatrist at least yearly or according to recommendations, and advised to stay away from smoking. I have recommended yearly flu vaccine and pneumonia vaccine at least every 5 years; moderate intensity exercise for up to 150 minutes weekly; and  sleep for at least 7 hours a day.  - she is advised to maintain close follow up with Hasanaj, Samul Dada, MD for primary care needs, as well as her other providers for optimal and coordinated care.     I spent 40 minutes in the care of the patient today including review of labs from [place], Lipids, Thyroid Function, Hematology (current and previous including abstractions from other facilities); face-to-face time discussing  her blood glucose readings/logs, discussing hypoglycemia and hyperglycemia episodes and symptoms, medications doses, her options of short and long term treatment based on the latest standards of care / guidelines;  discussion about incorporating lifestyle medicine;  and documenting the encounter.    Please refer to Patient Instructions for Blood Glucose Monitoring and Insulin/Medications Dosing Guide"  in media tab for additional information. Please  also refer to " Patient Self Inventory" in the Media  tab for reviewed elements of pertinent patient history.  [facility] participated in the discussions, expressed understanding, and voiced agreement with the above plans.  All questions were answered to her satisfaction. she is encouraged to contact clinic should she have any questions or concerns prior to her return visit.  Follow up plan: - Return for keep regularly scheduled appointment for end of April.  Rayetta Pigg, [facility] [address] [place],  [address] Phone: [phone] Fax: [phone]   02/05/2021, 4:27 PM

## 2021-02-05 NOTE — Patient Instructions (Signed)

## 2021-02-06 ENCOUNTER — Other Ambulatory Visit: Payer: Self-pay | Admitting: Nurse Practitioner

## 2021-02-06 DIAGNOSIS — E1165 Type 2 diabetes mellitus with hyperglycemia: Secondary | ICD-10-CM
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
## 2021-02-19 ENCOUNTER — Encounter: Payer: Self-pay | Admitting: Nutrition

## 2021-02-19 ENCOUNTER — Other Ambulatory Visit: Payer: Self-pay

## 2021-02-19 ENCOUNTER — Encounter: Payer: Medicaid Other | Attending: Nurse Practitioner | Admitting: Nutrition

## 2021-02-19 VITALS — Ht 70.0 in | Wt 290.0 lb

## 2021-02-19 DIAGNOSIS — I10 Essential (primary) hypertension: Secondary | ICD-10-CM

## 2021-02-19 DIAGNOSIS — K7460 Unspecified cirrhosis of liver: Secondary | ICD-10-CM

## 2021-02-19 DIAGNOSIS — IMO0002 Reserved for concepts with insufficient information to code with codable children: Secondary | ICD-10-CM

## 2021-02-19 DIAGNOSIS — E782 Mixed hyperlipidemia: Secondary | ICD-10-CM | POA: Diagnosis present

## 2021-02-19 DIAGNOSIS — E1165 Type 2 diabetes mellitus with hyperglycemia: Secondary | ICD-10-CM | POA: Diagnosis present

## 2021-02-19 DIAGNOSIS — E118 Type 2 diabetes mellitus with unspecified complications: Secondary | ICD-10-CM | POA: Diagnosis not present

## 2021-02-19 NOTE — Progress Notes (Signed)
Medical Nutrition Therapy  Appointment Start time:  0830  Appointment End time:  0930 Primary concerns today: Diabetes Type 2, Obesity Referral diagnosis: E11.8, E66.01 Preferred learning style:  no preference indicated Learning readiness:  change in progress   NUTRITION ASSESSMENT   Anthropometrics  Wt Readings from Last 3 Encounters:  02/19/21 290 lb (131.5 kg)  02/05/21 281 lb 3.2 oz (127.6 kg)  11/27/20 (!) 307 lb (139.3 kg)   Ht Readings from Last 3 Encounters:  02/19/21 5' 10"  (1.778 m)  02/05/21 5' 9"  (1.753 m)  09/25/20 5' 9"  (1.753 m)   Body mass index is 41.61 kg/m. @BMIFA @ Facility age limit for growth percentiles is 20 years. Facility age limit for growth percentiles is 20 years.   Clinical Medical Hx: Dm Type 2, GERD, Cirrhosis, Respiratory failure Medications: Victoza, Insulin 70/30 45 units twice a day, Metformin 1000 mg a day,  Labs: A1C 8.9%, down from 10.1% Notable Signs/Symptoms: Fatigue, increased thirsty, urination  Lifestyle & Dietary Hx LIves with her mom.   Estimated daily fluid intake: 64 oz Supplements: MVI,  Sleep: varies Stress / self-care:  Current average weekly physical activity: walks dog a little  24-Hr Dietary Recall Eats 2-3 meals per day Drinks diet sodas,   Estimated Energy Needs Calories: 1200 Carbohydrate: 135g Protein: 90g Fat: 33g   NUTRITION DIAGNOSIS  NB-1.1 Food and nutrition-related knowledge deficit As related to Diabetes Type 2.  As evidenced by A1C .   NUTRITION INTERVENTION  Nutrition education (E-1) on the following topics:  . Nutrition and Diabetes education provided on My Plate, CHO counting, meal planning, portion sizes, timing of meals, avoiding snacks between meals unless having a low blood sugar, target ranges for A1C and blood sugars, signs/symptoms and treatment of hyper/hypoglycemia, monitoring blood sugars, taking medications as prescribed, benefits of exercising 30 minutes per day and prevention  of complications of DM.  Handouts Provided Include   The Plate Method    Meal Plan Card  Diabetes Instructions.  Learning Style & Readiness for Change Teaching method utilized: Visual & Auditory  Demonstrated degree of understanding via: Teach Back  Barriers to learning/adherence to lifestyle change: none  Goals Established by Pt  Goals Eat three meals per day Don't skip meals Test blood sugar 2-3 times per week Drink only water Walk 30 minutes daily Lose 1 lb per week  Get FBS less than 130 mg/dland Bedtime less than 150 mg/dl.   MONITORING & EVALUATION Dietary intake, weekly physical activity, and blood sugar in 1 month.  Next Steps  Patient is to work on healthier foods choices. Marland Kitchen

## 2021-02-20 LAB — COMPREHENSIVE METABOLIC PANEL
ALT: 28 IU/L (ref 0–32)
AST: 31 IU/L (ref 0–40)
Albumin/Globulin Ratio: 1.1 — ABNORMAL LOW (ref 1.2–2.2)
Albumin: 3.6 g/dL — ABNORMAL LOW (ref 3.8–4.8)
Alkaline Phosphatase: 139 IU/L — ABNORMAL HIGH (ref 44–121)
BUN/Creatinine Ratio: 10 (ref 9–23)
BUN: 6 mg/dL (ref 6–20)
Bilirubin Total: 0.5 mg/dL (ref 0.0–1.2)
CO2: 24 mmol/L (ref 20–29)
Calcium: 8.7 mg/dL (ref 8.7–10.2)
Chloride: 99 mmol/L (ref 96–106)
Creatinine, Ser: 0.59 mg/dL (ref 0.57–1.00)
Globulin, Total: 3.4 g/dL (ref 1.5–4.5)
Glucose: 409 mg/dL — ABNORMAL HIGH (ref 65–99)
Potassium: 3.8 mmol/L (ref 3.5–5.2)
Sodium: 135 mmol/L (ref 134–144)
Total Protein: 7 g/dL (ref 6.0–8.5)
eGFR: 120 mL/min/{1.73_m2} (ref >59)

## 2021-02-21 ENCOUNTER — Telehealth: Payer: Self-pay | Admitting: Psychiatry

## 2021-02-21 ENCOUNTER — Telehealth: Payer: Medicaid Other | Admitting: Psychiatry

## 2021-02-21 ENCOUNTER — Other Ambulatory Visit: Payer: Self-pay

## 2021-02-21 NOTE — Telephone Encounter (Signed)
Sent link for video visit through [facility]. Patient did not sign in. Called the patient for appointment scheduled today. Voice mail box was full.

## 2021-02-26 ENCOUNTER — Ambulatory Visit (INDEPENDENT_AMBULATORY_CARE_PROVIDER_SITE_OTHER): Payer: Medicaid Other | Admitting: Nurse Practitioner

## 2021-02-26 ENCOUNTER — Encounter: Payer: Self-pay | Admitting: Nurse Practitioner

## 2021-02-26 ENCOUNTER — Encounter: Payer: Self-pay | Admitting: Nutrition

## 2021-02-26 ENCOUNTER — Other Ambulatory Visit: Payer: Self-pay

## 2021-02-26 ENCOUNTER — Encounter: Payer: Medicaid Other | Attending: Nurse Practitioner | Admitting: Nutrition

## 2021-02-26 VITALS — Ht 70.0 in | Wt 286.0 lb

## 2021-02-26 VITALS — BP 113/66 | HR 96 | Ht 69.0 in | Wt 286.4 lb

## 2021-02-26 DIAGNOSIS — I10 Essential (primary) hypertension: Secondary | ICD-10-CM | POA: Diagnosis present

## 2021-02-26 DIAGNOSIS — Z9111 Patient's noncompliance with dietary regimen: Secondary | ICD-10-CM

## 2021-02-26 DIAGNOSIS — Z91119 Patient's noncompliance with dietary regimen due to unspecified reason: Secondary | ICD-10-CM

## 2021-02-26 DIAGNOSIS — E119 Type 2 diabetes mellitus without complications: Secondary | ICD-10-CM | POA: Diagnosis present

## 2021-02-26 DIAGNOSIS — E1165 Type 2 diabetes mellitus with hyperglycemia: Secondary | ICD-10-CM | POA: Insufficient documentation

## 2021-02-26 DIAGNOSIS — E782 Mixed hyperlipidemia: Secondary | ICD-10-CM

## 2021-02-26 DIAGNOSIS — K7460 Unspecified cirrhosis of liver: Secondary | ICD-10-CM

## 2021-02-26 DIAGNOSIS — E118 Type 2 diabetes mellitus with unspecified complications: Secondary | ICD-10-CM | POA: Insufficient documentation

## 2021-02-26 DIAGNOSIS — IMO0002 Reserved for concepts with insufficient information to code with codable children: Secondary | ICD-10-CM

## 2021-02-26 LAB — POCT GLYCOSYLATED HEMOGLOBIN (HGB A1C): HbA1c, POC (controlled diabetic range): 8.9 % — AB (ref 0.0–7.0)

## 2021-02-26 LAB — POCT UA - MICROALBUMIN: Microalbumin Ur, POC: 80 mg/L

## 2021-02-26 MED ORDER — GLIPIZIDE ER 5 MG PO TB24: 5.0000 mg | ORAL_TABLET | Freq: Every day | ORAL | 3 refills | Status: DC

## 2021-02-26 MED ORDER — VICTOZA 18 MG/3ML ~~LOC~~ SOPN: 1.8000 mg | PEN_INJECTOR | Freq: Every day | SUBCUTANEOUS | 3 refills | Status: DC

## 2021-02-26 MED ORDER — PEN NEEDLES 32G X 6 MM MISC: 1.0000 | Freq: Three times a day (TID) | 3 refills | Status: AC

## 2021-02-26 MED ORDER — INSULIN ISOPHANE & REGULAR (HUMAN 70-30)100 UNIT/ML KWIKPEN
45.0000 [IU] | PEN_INJECTOR | Freq: Two times a day (BID) | SUBCUTANEOUS | 6 refills | Status: DC
Start: 2021-02-26 — End: 2021-03-18

## 2021-02-26 NOTE — Patient Instructions (Signed)
Goals  Cut out tea, soda and sugared cereals Eat a [place] sandwich instead of sugared cereals Call Dr. Sherrie Sport to ask getting blood work to see if you're anemic. Try beef liver and other iron rich foods to help improve anemia if is an issue. Keep drinking a gallon a day of water Increase walking at home. Get A1C down to 7% Lose 1-2 lbs per week Goal am blood sugar less than 130 and bedtime less than 150 mg/dl.

## 2021-02-26 NOTE — Progress Notes (Signed)
Medical Nutrition Therapy  Appointment Start time:  [phone]  Appointment End time:  1045Primary concerns today: Diabetes Type 2, Obesity Follow up Referral diagnosis: E11.8, E66.01 Preferred learning style:  no preference indicated Learning readiness:  change in progress   NUTRITION ASSESSMENT  Changes Made: Lost 4 lbs. I drinking more water with crushed ice. Craves crushed ice. Eating more vegetables, beets, turnips.  45 units of 70/30 twice a day.   Anthropometrics  Wt Readings from Last 3 Encounters:  02/26/21 286 lb 6.4 oz (129.9 kg)  02/19/21 290 lb (131.5 kg)  02/05/21 281 lb 3.2 oz (127.6 kg)   Ht Readings from Last 3 Encounters:  02/26/21 5' 9"  (1.753 m)  02/19/21 5' 10"  (1.778 m)  02/05/21 5' 9"  (1.753 m)   There is no height or weight on file to calculate BMI. @BMIFA @ Facility age limit for growth percentiles is 20 years. Facility age limit for growth percentiles is 20 years.   Clinical Medical Hx: Dm Type 2, Obesity, Fatty Liver, Hyperlipidemia, depression Medications: Dexcom, 70/30 90 units in am and 80 units pm. VIctoza daily., Metformin 1000 mg BID.  Started on Glipizide 5 mg  Labs:  Lab Results  Component Value Date   HGBA1C 8.9 (A) 02/26/2021   CMP Latest Ref Rng & Units 02/19/2021 09/09/2020 09/08/2020  Glucose 65 - 99 mg/dL 409(H) 125(H) 124(H)  BUN 6 - 20 mg/dL 6 11 12   Creatinine 0.57 - 1.00 mg/dL 0.59 0.63 0.53  Sodium 134 - 144 mmol/L 135 142 143  Potassium 3.5 - 5.2 mmol/L 3.8 4.3 3.5  Chloride 96 - 106 mmol/L 99 106 108  CO2 20 - 29 mmol/L 24 27 26   Calcium 8.7 - 10.2 mg/dL 8.7 8.7(L) 8.7(L)  Total Protein 6.0 - 8.5 g/dL 7.0 - -  Total Bilirubin 0.0 - 1.2 mg/dL 0.5 - -  Alkaline Phos 44 - 121 IU/L 139(H) - -  AST 0 - 40 IU/L 31 - -  ALT 0 - 32 IU/L 28 - -   Lipid Panel     Component Value Date/Time   CHOL 210 (H) 07/23/2020 0842   TRIG 86 08/12/2020 1023   HDL 60 07/23/2020 0842   CHOLHDL 3.5 07/23/2020 0842   LDLCALC 116 (H)  07/23/2020 0842   LABVLDL 34 07/23/2020 0842    Notable Signs/Symptoms: Fatigue, increased thirst, hunger ,increased urination. Blurry vision, headaches  Lifestyle & Dietary Hx .  Estimated daily fluid intake: 32 oz Supplements: mvt Sleep: varies Stress / self-care: yes Current average weekly physical activity: walks dog  24-Hr Dietary Recall First Meal: Coco crispies, milk, Snack:  Second Meal: Turnip greens, okra, hush puppies, sweet tea  Snack:  Third Meal: Steak and cheese subs homemade, water Snack:  Beverages: water, some tea, diet soda  Estimated Energy Needs Calories: 1200  Carbohydrate: 135g Protein: 90g Fat: 33g   NUTRITION DIAGNOSIS  NB-1.1 Food and nutrition-related knowledge deficit As related to Diabetes Type 2.  As evidenced by A1C 8.9%   NUTRITION INTERVENTION  Nutrition education (E-1) on the following topics:  . Nutrition and Diabetes education provided on My Plate, CHO counting, meal planning, portion sizes, timing of meals, avoiding snacks between meals unless having a low blood sugar, target ranges for A1C and blood sugars, signs/symptoms and treatment of hyper/hypoglycemia, monitoring blood sugars, taking medications as prescribed, benefits of exercising 30 minutes per day and prevention of complications of DM. Marland Kitchen   Handouts Provided Include   The Plate Method   Meal Plan  Card  Diabetes Instructions   Learning Style & Readiness for Change Teaching method utilized: Visual & Auditory  Demonstrated degree of understanding via: Teach Back  Barriers to learning/adherence to lifestyle change: none  Goals Established by Pt Goals  Cut out tea, soda and sugared cereals Eat a [place] sandwich instead of sugared cereals Call Dr. Sherrie Sport to ask getting blood work to see if you're anemic. Try beef liver and other iron rich foods to help improve anemia if is an issue. Keep drinking a gallon a day of water Increase walking at home. Get A1C down to  7% Lose 1-2 lbs per week Goal am blood sugar less than 130 and bedtime less than 150 mg/dl. .    MONITORING & EVALUATION Dietary intake, weekly physical activity, and blood sugars and weight in 1 month.  Next Steps  Patient is to work on Systems analyst and meal planning.

## 2021-02-26 NOTE — Patient Instructions (Signed)

## 2021-02-26 NOTE — Progress Notes (Signed)
02/26/2021, 11:17 AM     Endocrinology Follow Up Visit  Subjective:    Patient ID: Jaime [name], female    DOB: [birth year].  [name] is being seen in follow up for management of currently uncontrolled symptomatic diabetes requested by  Neale Burly, MD.   Past Medical History:  Diagnosis Date  . Anxiety   . Asthma   . Chronic abdominal pain   . Chronic back pain   . Cirrhosis ([place])   . Cirrhosis ([place])   . COPD (chronic obstructive pulmonary disease) ([place])   . Depression   . Diabetes mellitus without complication ([place])   . Diabetes mellitus, type II ([place])   . Hepatitis C   . Insomnia   . Long-term current use of methadone for opiate dependence ([place])   . Lupus ([place])   . Migraine headache   . Neuropathy   . Nocturnal seizures ([place])   . Peptic ulcer   . Spleen enlarged     Past Surgical History:  Procedure Laterality Date  . CHOLECYSTECTOMY      Social History   Socioeconomic History  . Marital status: Single    Spouse name: Not on file  . Number of children: Not on file  . Years of education: Not on file  . Highest education level: Not on file  Occupational History  . Not on file  Tobacco Use  . Smoking status: Current Every Day Smoker    Packs/day: 0.00    Years: 5.00    Pack years: 0.00    Types: E-cigarettes  . Smokeless tobacco: Never Used  Vaping Use  . Vaping Use: Every day  . Substances: Nicotine, Flavoring  Substance and Sexual Activity  . Alcohol use: No  . Drug use: No  . Sexual activity: Not Currently    Birth control/protection: Implant  Other Topics Concern  . Not on file  Social History Narrative  . Not on file   Social Determinants of Health   Financial Resource Strain: Medium Risk  . Difficulty of Paying Living Expenses: Somewhat hard  Food Insecurity: No Food Insecurity  . Worried About Charity fundraiser in the Last Year:  Never true  . Ran Out of Food in the Last Year: Never true  Transportation Needs: No Transportation Needs  . Lack of Transportation (Medical): No  . Lack of Transportation (Non-Medical): No  Physical Activity: Insufficiently Active  . Days of Exercise per Week: 1 day  . Minutes of Exercise per Session: 20 min  Stress: Stress Concern Present  . Feeling of Stress : Very much  Social Connections: Moderately Isolated  . Frequency of Communication with Friends and Family: More than three times a week  . Frequency of Social Gatherings with Friends and Family: Twice a week  . Attends Religious Services: More than 4 times per year  . Active Member of Clubs or Organizations: No  . Attends Archivist Meetings: Never  . Marital Status: Never married    Family History  Problem Relation Age of Onset  . Hypertension Mother   . Hyperlipidemia Mother   .  Hyperlipidemia Maternal Grandfather     Outpatient Encounter Medications as of 02/26/2021  Medication Sig  . Accu-Chek Softclix Lancets lancets 4 (four) times daily.  Marland Kitchen albuterol (ACCUNEB) 0.63 MG/3ML nebulizer solution Take by nebulization 4 (four) times daily.  Marland Kitchen albuterol (PROVENTIL) (2.5 MG/3ML) 0.083% nebulizer solution Take 2.5 mg by nebulization every 6 (six) hours as needed for wheezing or shortness of breath.  . ARIPiprazole (ABILIFY) 2 MG tablet Take 1 tablet (2 mg total) by mouth daily. (Patient taking differently: Take 5 mg by mouth daily.)  . aspirin EC 81 MG tablet Take 162 mg by mouth daily. Swallow whole.  . cetirizine (ZYRTEC ALLERGY) 10 MG tablet Take 1 tablet (10 mg total) by mouth daily.  . Continuous Blood Gluc Sensor (DEXCOM G6 SENSOR) MISC 4 Pieces by Does not apply route once a week.  . Continuous Blood Gluc Transmit (DEXCOM G6 TRANSMITTER) MISC 1 Piece by Does not apply route as directed.  . etonogestrel (NEXPLANON) 68 MG IMPL implant 1 each by Subdermal route once.  . furosemide (LASIX) 40 MG tablet Take 80 mg  by mouth daily.   Marland Kitchen glipiZIDE (GLUCOTROL XL) 5 MG 24 hr tablet Take 1 tablet (5 mg total) by mouth daily with breakfast.  . glucose blood (ACCU-CHEK GUIDE) test strip Use as instructed to check blood glucose four times daily  . hyoscyamine (ANASPAZ) 0.125 MG TBDP disintergrating tablet Place 0.125 mg under the tongue 3 (three) times daily as needed for bladder spasms or cramping (Patient Preference).   . lactulose (CHRONULAC) 10 GM/15ML solution Take 30 mLs (20 g total) by mouth 3 (three) times daily.  Marland Kitchen lisinopril (ZESTRIL) 5 MG tablet Take 1 tablet (5 mg total) by mouth daily.  . metFORMIN (GLUCOPHAGE) 1000 MG tablet Take 1 tablet (1,000 mg total) by mouth 2 (two) times daily with a meal.  . methadone (DOLOPHINE) 10 MG/5ML solution Take 30 mg by mouth every morning. 49m  . Multiple Vitamin (MULTIVITAMIN) tablet Take 1 tablet by mouth daily.  . ondansetron (ZOFRAN-ODT) 4 MG disintegrating tablet Take 4 mg by mouth every 8 (eight) hours as needed for nausea.   . pantoprazole (PROTONIX) 40 MG tablet Take 1 tablet (40 mg total) by mouth daily.  . potassium chloride (KLOR-CON) 10 MEQ tablet Take 20 mEq by mouth daily.   . pregabalin (LYRICA) 75 MG capsule Take 75 mg by mouth in the morning, at noon, in the evening, and at bedtime.   . propranolol (INDERAL) 10 MG tablet Take 10 mg by mouth 3 (three) times daily.  . RESTASIS 0.05 % ophthalmic emulsion Place 1 drop into both eyes as needed (dry eye).   . rosuvastatin (CRESTOR) 5 MG tablet Take 1 tablet (5 mg total) by mouth daily.  .Marland Kitchenspironolactone (ALDACTONE) 100 MG tablet Take 100 mg by mouth daily.   . sucralfate (CARAFATE) 1 g tablet Take 1 g by mouth 4 (four) times daily.  .Marland Kitchentriamcinolone (KENALOG) 0.1 % Apply topically daily.  .Marland Kitchenvenlafaxine XR (EFFEXOR-XR) 150 MG 24 hr capsule Take total of 225 mg daily (150 mg + 75 mg )  . venlafaxine XR (EFFEXOR-XR) 75 MG 24 hr capsule To take along with 1569mfor a total of 225 mg daily  . [DISCONTINUED]  insulin isophane & regular human (HUMULIN 70/30 MIX) (70-30) 100 UNIT/ML KwikPen Inject 45 Units into the skin 2 (two) times daily. Inject 90 units with breakfast and 80 units with supper (Patient taking differently: Inject 45 Units into the skin 2 (  two) times daily.)  . [DISCONTINUED] Insulin Pen Needle (PEN NEEDLES) 32G X 6 MM MISC 1 each by Does not apply route 3 (three) times daily.  . [DISCONTINUED] liraglutide (VICTOZA) 18 MG/3ML SOPN Inject 1.8 mg into the skin daily.  . clonazePAM (KLONOPIN) 0.5 MG tablet Take 1 tablet (0.5 mg total) by mouth 2 (two) times daily as needed for anxiety.  . insulin isophane & regular human (HUMULIN 70/30 MIX) (70-30) 100 UNIT/ML KwikPen Inject 45 Units into the skin 2 (two) times daily. Inject 90 units with breakfast and 80 units with supper  . Insulin Pen Needle (PEN NEEDLES) 32G X 6 MM MISC 1 each by Does not apply route 3 (three) times daily.  Marland Kitchen liraglutide (VICTOZA) 18 MG/3ML SOPN Inject 1.8 mg into the skin daily.   No facility-administered encounter medications on file as of 02/26/2021.    ALLERGIES: Allergies  Allergen Reactions  . Iodine-131 Anaphylaxis, Shortness Of Breath and Swelling  . Ivp Dye [Iodinated Diagnostic Agents] Anaphylaxis    Skin gets very red, unable to walk   . Ketorolac Tromethamine Shortness Of Breath  . Tylenol [Acetaminophen] Other (See Comments)    Due to cirrhosis   . Gabapentin   . Morphine And Related     Pt says she is not allergic to Morphine 08/12/20  . Nsaids Other (See Comments)    Flares ulcers  . Suboxone [Buprenorphine Hcl-Naloxone Hcl]   . Vancomycin Swelling    Facial swelling,     VACCINATION STATUS: Immunization History  Administered Date(s) Administered  . Influenza,inj,Quad PF,6+ Mos 07/18/2016  . Moderna Sars-Covid-2 Vaccination 03/07/2020, 04/04/2020  . Pneumococcal Polysaccharide-23 07/18/2016, 08/01/2020    Diabetes She presents for her follow-up diabetic visit. She has type 2 diabetes  mellitus. Onset time: She was diagnosed at approximate age of 36 years. Her disease course has been improving. There are no hypoglycemic associated symptoms. Pertinent negatives for hypoglycemia include no headaches, nervousness/anxiousness, pallor, sweats or tremors. Associated symptoms include blurred vision, foot paresthesias, polydipsia and polyuria. Pertinent negatives for diabetes include no chest pain, no fatigue, no polyphagia and no weight loss. There are no hypoglycemic complications. (History of unresponsiveness requiring EMS and hospitalization) Symptoms are improving. Diabetic complications include heart disease and peripheral neuropathy. Risk factors for coronary artery disease include diabetes mellitus, obesity, sedentary lifestyle, dyslipidemia and hypertension. Current diabetic treatment includes insulin injections and oral agent (monotherapy). She is compliant with treatment most of the time. Her weight is fluctuating dramatically. She is following a low salt and generally unhealthy diet. When asked about meal planning, she reported none. She has had a previous visit with a dietitian. She participates in exercise intermittently. Her home blood glucose trend is decreasing steadily. Her breakfast blood glucose range is generally >200 mg/dl. Her lunch blood glucose range is generally >200 mg/dl. Her dinner blood glucose range is generally >200 mg/dl. Her bedtime blood glucose range is generally >200 mg/dl. Her overall blood glucose range is >200 mg/dl. (She presents, accompanied by her mother, with her meter and logs showing improved, yet still above target glycemic profile overall.  Her POCT A1c today is 8.9%, improving from last visit of 10.1%.  She has been working hard to cut out sugary beverages out of her diet and is working with Kieth Brightly, RDE to develop good eating habits.  There are no episodes of hypoglycemia noted.) An ACE inhibitor/angiotensin II receptor blocker is being taken. She does not  see a podiatrist.Eye exam is not current.  Hypertension This is  a chronic problem. The current episode started more than 1 month ago. The problem is unchanged. The problem is controlled. Associated symptoms include blurred vision and peripheral edema. Pertinent negatives include no chest pain, headaches, palpitations, shortness of breath or sweats. There are no associated agents to hypertension. Risk factors for coronary artery disease include diabetes mellitus, dyslipidemia, obesity and sedentary lifestyle. Past treatments include diuretics and beta blockers. The current treatment provides no improvement. There are no compliance problems.      Review of systems  Constitutional: + drastically fluctuating weight,  current Body mass index is 42.29 kg/m. , + fatigue, no subjective hyperthermia, no subjective hypothermia,  Eyes: + blurry vision, no xerophthalmia ENT: no sore throat, no nodules palpated in throat, no dysphagia/odynophagia, no hoarseness Cardiovascular: no chest pain, no shortness of breath, no palpitations, + swelling- wearing compression socks Respiratory: no cough, no SOB Gastrointestinal: no nausea/vomiting/diarrhea Musculoskeletal: no muscle/joint aches Skin: + rash to LLE (on steroid cream), no hyperemia Neurological: no tremors, no numbness, no tingling, no dizziness Psychiatric: no depression, no anxiety  Objective:     BP 113/66 (BP Location: Left Arm)   Pulse 96   Ht 5' 9"  (1.753 m)   Wt 286 lb 6.4 oz (129.9 kg)   BMI 42.29 kg/m   Wt Readings from Last 3 Encounters:  02/26/21 286 lb (129.7 kg)  02/26/21 286 lb 6.4 oz (129.9 kg)  02/19/21 290 lb (131.5 kg)    BP Readings from Last 3 Encounters:  02/26/21 113/66  02/05/21 109/72  11/27/20 118/73    Physical Exam- Limited  Constitutional:  Body mass index is 42.29 kg/m. , not in acute distress, normal state of mind Eyes:  EOMI, no exophthalmos Neck: Supple Cardiovascular: RRR, no murmers, rubs, or  gallops, no edema Respiratory: Adequate breathing efforts, no crackles, rales, rhonchi, or wheezing Musculoskeletal: no gross deformities, strength intact in all four extremities, no gross restriction of joint movements Skin:  + rash to LLE (has steroid cream she has been applying), no hyperemia Neurological: no tremor with outstretched hands  POCT ABI Results 02/26/21   Right ABI:  1.17      Left ABI:  1.22  Right leg systolic / diastolic: 762/83 mmHg Left leg systolic / diastolic: 151/76 mmHg  Arm systolic / diastolic: 160/73 mmHG  Detailed report will be scanned into patient chart.   CMP ( most recent) CMP     Component Value Date/Time   NA 135 02/19/2021 0822   K 3.8 02/19/2021 0822   CL 99 02/19/2021 0822   CO2 24 02/19/2021 0822   GLUCOSE 409 (H) 02/19/2021 0822   GLUCOSE 125 (H) 09/09/2020 0226   BUN 6 02/19/2021 0822   CREATININE 0.59 02/19/2021 0822   CALCIUM 8.7 02/19/2021 0822   PROT 7.0 02/19/2021 0822   ALBUMIN 3.6 (L) 02/19/2021 0822   AST 31 02/19/2021 0822   ALT 28 02/19/2021 0822   ALKPHOS 139 (H) 02/19/2021 0822   BILITOT 0.5 02/19/2021 0822   GFRNONAA >60 09/09/2020 0226   GFRAA >60 08/01/2020 0704     Diabetic Labs (most recent): Lab Results  Component Value Date   HGBA1C 8.9 (A) 02/26/2021   HGBA1C 10.1 (A) 11/27/2020   HGBA1C 7.1 (H) 08/13/2020     Lipid Panel ( most recent) Lipid Panel     Component Value Date/Time   CHOL 210 (H) 07/23/2020 0842   TRIG 86 08/12/2020 1023   HDL 60 07/23/2020 0842   CHOLHDL 3.5 07/23/2020 7106  [place] 116 (H) 07/23/2020 0842   LABVLDL 34 07/23/2020 0842      Lab Results  Component Value Date   TSH 0.635 09/08/2020   TSH 0.993 07/30/2020   TSH 1.990 07/23/2020   TSH 1.690 08/22/2010   FREET4 0.72 (L) 07/23/2020   FREET4 1.11 08/22/2010      Assessment & Plan:   1) Type 2 diabetes mellitus with hyperglycemia, without long-term current use of insulin ([place])  - Jaime [name] has  currently uncontrolled symptomatic type 2 DM since  35 years of age.  She presents, accompanied by her mother, with her meter and logs showing improved, yet still above target glycemic profile overall.  Her POCT A1c today is 8.9%, improving from last visit of 10.1%.  She has been working hard to cut out sugary beverages out of her diet and is working with Kieth Brightly, RDE to develop good eating habits.  There are no episodes of hypoglycemia noted.  - I had a long discussion with her about the progressive nature of diabetes and the pathology behind its complications.  -her diabetes is complicated by obesity/sedentary life and she remains at a high risk for more acute and chronic complications which include CAD, CVA, CKD, retinopathy, and neuropathy. These are all discussed in detail with her.  - Nutritional counseling repeated at each appointment due to patients tendency to fall back in to old habits.  - The patient admits there is a room for improvement in their diet and drink choices. -  Suggestion is made for the patient to avoid simple carbohydrates from their diet including Cakes, Sweet Desserts / Pastries, Ice Cream, Soda (diet and regular), Sweet Tea, Candies, Chips, Cookies, Sweet Pastries,  Store Bought Juices, Alcohol in Excess of  1-2 drinks a day, Artificial Sweeteners, Coffee Creamer, and "Sugar-free" Products. This will help patient to have stable blood glucose profile and potentially avoid unintended weight gain.   - I encouraged the patient to switch to  unprocessed or minimally processed complex starch and increased protein intake (animal or plant source), fruits, and vegetables.   - Patient is advised to stick to a routine mealtimes to eat 3 meals  a day and avoid unnecessary snacks ( to snack only to correct hypoglycemia).  - I have approached her with the following individualized plan to manage  her diabetes and patient agrees:   -Since her recent hospitalization, her mother has  assumed responsibility for her diabetes management.  Number 1 priority will be to avoid hypoglycemia for her given her comorbidities.    -Although her A1c has improved, she still needs more medication to help her control her diabetes.  We discussed and I initiated low dose Glipizide 5 mg XL daily with breakfast.  Her other medications will be kept the same. She can continue her 70/30 at 45 units with breakfast and supper if glucose is above 90 and she is eating (along with strict dietary changes to avoid sweetened beverages).  She is advised to continue current dose of Victoza 1.8 mg SQ daily and continue Metformin 1000 mg po twice daily with meals.  -She is encouraged to continue using CGM to monitor glucose 4 times daily, before meals and before bed and log on the clinic sheets provided.  They are instructed to call the clinic if she has readings less than 70 or greater than 300 for 3 tests in a row.  She recently received denial letter for restarting her CGM.  We are awaiting results from insurance  about appeal.   - Specific targets for  A1c;  LDL, HDL,  and Triglycerides were discussed with the patient.  2) Blood Pressure /Hypertension: Her blood pressure is controlled to target.  She is advised to continue Lasix 80 mg po daily, continue Lisinopril 5 mg po daily, continue Propanolol 10 mg po TID, and Aldactone 100 mg o daily  3) Lipids/Hyperlipidemia:   Review of her recent lipid profile from 07/23/20 shows uncontrolled LDL at 116 and elevated triglycerides of 197.  She is advised to continue Crestor 5 mg po daily at bedtime.  Side effects and precautions discussed with her.  4)  Weight/Diet:  Her Body mass index is 42.29 kg/m.  -   clearly complicating her diabetes care.  She has recently lost 8 lbs. she is  a candidate for modest weight loss. I discussed with her the fact that loss of 5 - 10% of her  current body weight will have the most impact on her diabetes management.  Exercise, and  detailed carbohydrates information provided  -  detailed on discharge instructions.  5) Chronic Care/Health Maintenance: -she is on ACE and statin medications and is encouraged to initiate and continue to follow up with Ophthalmology, Dentist,  Podiatrist at least yearly or according to recommendations, and advised to stay away from smoking. I have recommended yearly flu vaccine and pneumonia vaccine at least every 5 years; moderate intensity exercise for up to 150 minutes weekly; and  sleep for at least 7 hours a day.  - she is advised to maintain close follow up with Hasanaj, Samul Dada, MD for primary care needs, as well as her other providers for optimal and coordinated care.       I spent 45 minutes in the care of the patient today including review of labs from [place], Lipids, Thyroid Function, Hematology (current and previous including abstractions from other facilities); face-to-face time discussing  her blood glucose readings/logs, discussing hypoglycemia and hyperglycemia episodes and symptoms, medications doses, her options of short and long term treatment based on the latest standards of care / guidelines;  discussion about incorporating lifestyle medicine;  and documenting the encounter.    Please refer to Patient Instructions for Blood Glucose Monitoring and Insulin/Medications Dosing Guide"  in media tab for additional information. Please  also refer to " Patient Self Inventory" in the Media  tab for reviewed elements of pertinent patient history.  [facility] participated in the discussions, expressed understanding, and voiced agreement with the above plans.  All questions were answered to her satisfaction. she is encouraged to contact clinic should she have any questions or concerns prior to her return visit.  Follow up plan: - Return in about 3 months (around 05/28/2021) for Diabetes F/U with A1c in office, Bring meter and logs.  Rayetta Pigg, [facility] [address] [place], [place] [address] Phone: [phone] Fax: [phone]   02/26/2021, 11:17 AM

## 2021-03-04 ENCOUNTER — Encounter: Payer: Self-pay | Admitting: Nutrition

## 2021-03-04 NOTE — Patient Instructions (Signed)
Goals Established by Pt  Goals Eat three meals per day Don't skip meals Test blood sugar 2-3 times per week Drink only water Walk 30 minutes daily Lose 1 lb per week  Get FBS less than 130 mg/[place]land Bedtime less than 150 mg/dl.

## 2021-03-17 ENCOUNTER — Telehealth: Payer: Self-pay | Admitting: Nurse Practitioner

## 2021-03-17 DIAGNOSIS — E1165 Type 2 diabetes mellitus with hyperglycemia: Secondary | ICD-10-CM

## 2021-03-17 NOTE — Telephone Encounter (Signed)
She can increase  her 70/30 to 60 units with breakfast and supper if glucose is above 90 and she is eating , also continue  Victoza 1.8 mg SQ daily and continue Metformin 1000 mg po twice daily with meals. May return in 1 week with her meter and logs. Call back if still > 200 x 3.

## 2021-03-17 NOTE — Telephone Encounter (Signed)
Pt notified and agrees. 

## 2021-03-17 NOTE — Telephone Encounter (Signed)
Pt called in regards to high readings, states all weekend it was in the 300-400. She checked it this morning it was 440 and she just checked it again and it is reading HIGH. She is wanting to know if she needs to go up in her insulin or what exactly needs to be done. Ivin Booty pt mother would like a call back [phone].

## 2021-03-18 MED ORDER — INSULIN ISOPHANE & REGULAR (HUMAN 70-30)100 UNIT/ML KWIKPEN: 60.0000 [IU] | PEN_INJECTOR | Freq: Two times a day (BID) | SUBCUTANEOUS | 6 refills | Status: DC

## 2021-03-18 NOTE — Telephone Encounter (Signed)
done

## 2021-03-18 NOTE — Telephone Encounter (Signed)
Whitney, Do you want to see her in a week with logs? Please Advise

## 2021-03-18 NOTE — Addendum Note (Signed)
Addended by: Brita Romp on: 03/18/2021 03:28 PM   Modules accepted: Orders

## 2021-03-18 NOTE — Telephone Encounter (Signed)
Can you call this in for the patient? The change was to increase to 60 units for humalin 70/30.

## 2021-03-18 NOTE — Telephone Encounter (Signed)
Probably should to make sure she is being compliant

## 2021-03-26 ENCOUNTER — Ambulatory Visit (INDEPENDENT_AMBULATORY_CARE_PROVIDER_SITE_OTHER): Payer: Medicaid Other | Admitting: Nurse Practitioner

## 2021-03-26 ENCOUNTER — Other Ambulatory Visit: Payer: Self-pay

## 2021-03-26 ENCOUNTER — Encounter: Payer: Self-pay | Admitting: Nurse Practitioner

## 2021-03-26 VITALS — BP 123/82 | HR 94 | Ht 69.0 in | Wt 289.0 lb

## 2021-03-26 DIAGNOSIS — Z91119 Patient's noncompliance with dietary regimen due to unspecified reason: Secondary | ICD-10-CM

## 2021-03-26 DIAGNOSIS — E782 Mixed hyperlipidemia: Secondary | ICD-10-CM | POA: Diagnosis not present

## 2021-03-26 DIAGNOSIS — E1165 Type 2 diabetes mellitus with hyperglycemia: Secondary | ICD-10-CM | POA: Diagnosis not present

## 2021-03-26 DIAGNOSIS — Z9111 Patient's noncompliance with dietary regimen: Secondary | ICD-10-CM | POA: Diagnosis not present

## 2021-03-26 MED ORDER — INSULIN ISOPHANE & REGULAR (HUMAN 70-30)100 UNIT/ML KWIKPEN: 70.0000 [IU] | PEN_INJECTOR | Freq: Two times a day (BID) | SUBCUTANEOUS | 6 refills | Status: DC

## 2021-03-26 NOTE — Progress Notes (Signed)
03/26/2021, 9:57 AM     Endocrinology Follow Up Visit  Subjective:    Patient ID: Jaime [name], female    DOB: August 11, 1986.  [facility] is being seen in follow up for management of currently uncontrolled symptomatic diabetes requested by  Neale Burly, MD.   Past Medical History:  Diagnosis Date  . Anxiety   . Asthma   . Chronic abdominal pain   . Chronic back pain   . Cirrhosis ([place])   . Cirrhosis ([place])   . COPD (chronic obstructive pulmonary disease) ([place])   . Depression   . Diabetes mellitus without complication ([place])   . Diabetes mellitus, type II ([place])   . Hepatitis C   . Insomnia   . Long-term current use of methadone for opiate dependence ([place])   . Lupus ([place])   . Migraine headache   . Neuropathy   . Nocturnal seizures ([place])   . Peptic ulcer   . Spleen enlarged     Past Surgical History:  Procedure Laterality Date  . CHOLECYSTECTOMY      Social History   Socioeconomic History  . Marital status: Single    Spouse name: Not on file  . Number of children: Not on file  . Years of education: Not on file  . Highest education level: Not on file  Occupational History  . Not on file  Tobacco Use  . Smoking status: Current Every Day Smoker    Packs/day: 0.00    Years: 5.00    Pack years: 0.00    Types: E-cigarettes  . Smokeless tobacco: Never Used  Vaping Use  . Vaping Use: Every day  . Substances: Nicotine, Flavoring  Substance and Sexual Activity  . Alcohol use: No  . Drug use: No  . Sexual activity: Not Currently    Birth control/protection: Implant  Other Topics Concern  . Not on file  Social History Narrative  . Not on file   Social Determinants of Health   Financial Resource Strain: Medium Risk  . Difficulty of Paying Living Expenses: Somewhat hard  Food Insecurity: No Food Insecurity  . Worried About Charity fundraiser in the Last Year: Never  true  . Ran Out of Food in the Last Year: Never true  Transportation Needs: No Transportation Needs  . Lack of Transportation (Medical): No  . Lack of Transportation (Non-Medical): No  Physical Activity: Insufficiently Active  . Days of Exercise per Week: 1 day  . Minutes of Exercise per Session: 20 min  Stress: Stress Concern Present  . Feeling of Stress : Very much  Social Connections: Moderately Isolated  . Frequency of Communication with Friends and Family: More than three times a week  . Frequency of Social Gatherings with Friends and Family: Twice a week  . Attends Religious Services: More than 4 times per year  . Active Member of Clubs or Organizations: No  . Attends Archivist Meetings: Never  . Marital Status: Never married    Family History  Problem Relation Age of Onset  . Hypertension Mother   . Hyperlipidemia Mother   .  Hyperlipidemia Maternal Grandfather     Outpatient Encounter Medications as of 03/26/2021  Medication Sig  . guaiFENesin (MUCINEX) 600 MG 12 hr tablet Take by mouth 2 (two) times daily.  . Accu-Chek Softclix Lancets lancets 4 (four) times daily.  Marland Kitchen albuterol (ACCUNEB) 0.63 MG/3ML nebulizer solution Take by nebulization 4 (four) times daily.  Marland Kitchen albuterol (PROVENTIL) (2.5 MG/3ML) 0.083% nebulizer solution Take 2.5 mg by nebulization every 6 (six) hours as needed for wheezing or shortness of breath.  . ARIPiprazole (ABILIFY) 2 MG tablet Take 1 tablet (2 mg total) by mouth daily. (Patient taking differently: Take 5 mg by mouth daily.)  . aspirin EC 81 MG tablet Take 162 mg by mouth daily. Swallow whole.  . cetirizine (ZYRTEC ALLERGY) 10 MG tablet Take 1 tablet (10 mg total) by mouth daily.  . clonazePAM (KLONOPIN) 0.5 MG tablet Take 1 tablet (0.5 mg total) by mouth 2 (two) times daily as needed for anxiety.  . Continuous Blood Gluc Sensor (DEXCOM G6 SENSOR) MISC 4 Pieces by Does not apply route once a week.  . Continuous Blood Gluc Transmit  (DEXCOM G6 TRANSMITTER) MISC 1 Piece by Does not apply route as directed.  . etonogestrel (NEXPLANON) 68 MG IMPL implant 1 each by Subdermal route once.  . furosemide (LASIX) 40 MG tablet Take 80 mg by mouth daily.   Marland Kitchen glipiZIDE (GLUCOTROL XL) 5 MG 24 hr tablet Take 1 tablet (5 mg total) by mouth daily with breakfast.  . glucose blood (ACCU-CHEK GUIDE) test strip Use as instructed to check blood glucose four times daily  . hyoscyamine (ANASPAZ) 0.125 MG TBDP disintergrating tablet Place 0.125 mg under the tongue 3 (three) times daily as needed for bladder spasms or cramping (Patient Preference).   . insulin isophane & regular human (HUMULIN 70/30 MIX) (70-30) 100 UNIT/ML KwikPen Inject 70 Units into the skin 2 (two) times daily. Inject 90 units with breakfast and 80 units with supper  . Insulin Pen Needle (PEN NEEDLES) 32G X 6 MM MISC 1 each by Does not apply route 3 (three) times daily.  Marland Kitchen lactulose (CHRONULAC) 10 GM/15ML solution Take 30 mLs (20 g total) by mouth 3 (three) times daily.  Marland Kitchen liraglutide (VICTOZA) 18 MG/3ML SOPN Inject 1.8 mg into the skin daily.  Marland Kitchen lisinopril (ZESTRIL) 5 MG tablet Take 1 tablet (5 mg total) by mouth daily.  . metFORMIN (GLUCOPHAGE) 1000 MG tablet Take 1 tablet (1,000 mg total) by mouth 2 (two) times daily with a meal.  . methadone (DOLOPHINE) 10 MG/5ML solution Take 30 mg by mouth every morning. 68m  . Multiple Vitamin (MULTIVITAMIN) tablet Take 1 tablet by mouth daily.  . ondansetron (ZOFRAN-ODT) 4 MG disintegrating tablet Take 4 mg by mouth every 8 (eight) hours as needed for nausea.   . pantoprazole (PROTONIX) 40 MG tablet Take 1 tablet (40 mg total) by mouth daily.  . potassium chloride (KLOR-CON) 10 MEQ tablet Take 20 mEq by mouth daily.   . pregabalin (LYRICA) 75 MG capsule Take 75 mg by mouth in the morning, at noon, in the evening, and at bedtime.   . propranolol (INDERAL) 10 MG tablet Take 10 mg by mouth 3 (three) times daily.  . RESTASIS 0.05 %  ophthalmic emulsion Place 1 drop into both eyes as needed (dry eye).   . rosuvastatin (CRESTOR) 5 MG tablet Take 1 tablet (5 mg total) by mouth daily.  .Marland Kitchenspironolactone (ALDACTONE) 100 MG tablet Take 100 mg by mouth daily.   . sucralfate (CARAFATE)  1 g tablet Take 1 g by mouth 4 (four) times daily.  Marland Kitchen triamcinolone (KENALOG) 0.1 % Apply topically daily.  Marland Kitchen venlafaxine XR (EFFEXOR-XR) 150 MG 24 hr capsule Take total of 225 mg daily (150 mg + 75 mg )  . venlafaxine XR (EFFEXOR-XR) 75 MG 24 hr capsule To take along with 173m for a total of 225 mg daily  . [DISCONTINUED] insulin isophane & regular human (HUMULIN 70/30 MIX) (70-30) 100 UNIT/ML KwikPen Inject 60 Units into the skin 2 (two) times daily. Inject 90 units with breakfast and 80 units with supper   No facility-administered encounter medications on file as of 03/26/2021.    ALLERGIES: Allergies  Allergen Reactions  . Iodine-131 Anaphylaxis, Shortness Of Breath and Swelling  . Ivp Dye [Iodinated Diagnostic Agents] Anaphylaxis    Skin gets very red, unable to walk   . Ketorolac Tromethamine Shortness Of Breath  . Tylenol [Acetaminophen] Other (See Comments)    Due to cirrhosis   . Gabapentin   . Morphine And Related     Pt says she is not allergic to Morphine 08/12/20  . Nsaids Other (See Comments)    Flares ulcers  . Suboxone [Buprenorphine Hcl-Naloxone Hcl]   . Vancomycin Swelling    Facial swelling,     VACCINATION STATUS: Immunization History  Administered Date(s) Administered  . Influenza,inj,Quad PF,6+ Mos 07/18/2016  . Moderna Sars-Covid-2 Vaccination 03/07/2020, 04/04/2020  . Pneumococcal Polysaccharide-23 07/18/2016, 08/01/2020    Diabetes She presents for her follow-up diabetic visit. She has type 2 diabetes mellitus. Onset time: She was diagnosed at approximate age of 89years. Her disease course has been fluctuating. There are no hypoglycemic associated symptoms. Pertinent negatives for hypoglycemia include no  headaches, nervousness/anxiousness, pallor, sweats or tremors. Associated symptoms include blurred vision, foot paresthesias, polydipsia and polyuria. Pertinent negatives for diabetes include no chest pain, no fatigue, no polyphagia and no weight loss. There are no hypoglycemic complications. (History of unresponsiveness requiring EMS and hospitalization) Symptoms are improving. Diabetic complications include heart disease and peripheral neuropathy. Risk factors for coronary artery disease include diabetes mellitus, obesity, sedentary lifestyle, dyslipidemia and hypertension. Current diabetic treatment includes insulin injections and oral agent (monotherapy). She is compliant with treatment most of the time. Her weight is fluctuating dramatically. She is following a low salt and generally unhealthy diet. When asked about meal planning, she reported none. She has had a previous visit with a dietitian. She participates in exercise intermittently. Her home blood glucose trend is fluctuating minimally. Her breakfast blood glucose range is generally >200 mg/dl. Her lunch blood glucose range is generally >200 mg/dl. Her dinner blood glucose range is generally >200 mg/dl. Her bedtime blood glucose range is generally >200 mg/dl. Her overall blood glucose range is >200 mg/dl. (She presents, accompanied by her mother, with her logs showing persistently high glycemic profile overall.  Her mother is concerned because she is not following her diabetic diet and still consumes sweetened beverages despite repeated recommendations to cease use. Her most recent A1c was 8.9% on 4/27.  There are no episodes of hypoglycemia noted.) An ACE inhibitor/angiotensin II receptor blocker is being taken. She does not see a podiatrist.Eye exam is not current.  Hypertension This is a chronic problem. The current episode started more than 1 month ago. The problem is unchanged. The problem is controlled. Associated symptoms include blurred vision  and peripheral edema. Pertinent negatives include no chest pain, headaches, palpitations, shortness of breath or sweats. There are no associated agents to hypertension.  Risk factors for coronary artery disease include diabetes mellitus, dyslipidemia, obesity and sedentary lifestyle. Past treatments include diuretics and beta blockers. The current treatment provides no improvement. There are no compliance problems.      Review of systems  Constitutional: + Minimally fluctuating body weight,  current Body mass index is 42.68 kg/m. , + fatigue, no subjective hyperthermia, no subjective hypothermia Eyes: no blurry vision, no xerophthalmia ENT: no sore throat, no nodules palpated in throat, no dysphagia/odynophagia, no hoarseness Cardiovascular: no chest pain, no shortness of breath, no palpitations, + leg swelling Respiratory: no cough, no shortness of breath Gastrointestinal: no nausea/vomiting/diarrhea Musculoskeletal: no muscle/joint aches Skin: no rashes, no hyperemia Neurological: no tremors, no numbness, no tingling, no dizziness Psychiatric: no depression, no anxiety  Objective:     BP 123/82   Pulse 94   Ht 5' 9"  (1.753 m)   Wt 289 lb (131.1 kg)   BMI 42.68 kg/m   Wt Readings from Last 3 Encounters:  03/26/21 289 lb (131.1 kg)  02/26/21 286 lb (129.7 kg)  02/26/21 286 lb 6.4 oz (129.9 kg)    BP Readings from Last 3 Encounters:  03/26/21 123/82  02/26/21 113/66  02/05/21 109/72     Physical Exam- Limited  Constitutional:  Body mass index is 42.68 kg/m. , not in acute distress, normal state of mind Eyes:  EOMI, no exophthalmos Neck: Supple Cardiovascular: RRR, no murmurs, rubs, or gallops, no edema Respiratory: Adequate breathing efforts, no crackles, rales, rhonchi, or wheezing Musculoskeletal: no gross deformities, strength intact in all four extremities, no gross restriction of joint movements Skin:  no rashes, no hyperemia Neurological: no tremor with  outstretched hands    CMP ( most recent) CMP     Component Value Date/Time   NA 135 02/19/2021 0822   K 3.8 02/19/2021 0822   CL 99 02/19/2021 0822   CO2 24 02/19/2021 0822   GLUCOSE 409 (H) 02/19/2021 0822   GLUCOSE 125 (H) 09/09/2020 0226   BUN 6 02/19/2021 0822   CREATININE 0.59 02/19/2021 0822   CALCIUM 8.7 02/19/2021 0822   PROT 7.0 02/19/2021 0822   ALBUMIN 3.6 (L) 02/19/2021 0822   AST 31 02/19/2021 0822   ALT 28 02/19/2021 0822   ALKPHOS 139 (H) 02/19/2021 0822   BILITOT 0.5 02/19/2021 0822   GFRNONAA >60 09/09/2020 0226   GFRAA >60 08/01/2020 0704     Diabetic Labs (most recent): Lab Results  Component Value Date   HGBA1C 8.9 (A) 02/26/2021   HGBA1C 10.1 (A) 11/27/2020   HGBA1C 7.1 (H) 08/13/2020     Lipid Panel ( most recent) Lipid Panel     Component Value Date/Time   CHOL 210 (H) 07/23/2020 0842   TRIG 86 08/12/2020 1023   HDL 60 07/23/2020 0842   CHOLHDL 3.5 07/23/2020 0842   LDLCALC 116 (H) 07/23/2020 0842   LABVLDL 34 07/23/2020 0842      Lab Results  Component Value Date   TSH 0.635 09/08/2020   TSH 0.993 07/30/2020   TSH 1.990 07/23/2020   TSH 1.690 08/22/2010   FREET4 0.72 (L) 07/23/2020   FREET4 1.11 08/22/2010      Assessment & Plan:   1) Type 2 diabetes mellitus with hyperglycemia, without long-term current use of insulin ([place])  - Jaime [name] has currently uncontrolled symptomatic type 2 DM since  35 years of age.  She presents, accompanied by her mother, with her logs showing persistently high glycemic profile overall.  Her mother is concerned  because she is not following her diabetic diet and still consumes sweetened beverages despite repeated recommendations to cease use. Her most recent A1c was 8.9% on 4/27.  There are no episodes of hypoglycemia noted.  - I had a long discussion with her about the progressive nature of diabetes and the pathology behind its complications.  -her diabetes is complicated by  obesity/sedentary life and she remains at a high risk for more acute and chronic complications which include CAD, CVA, CKD, retinopathy, and neuropathy. These are all discussed in detail with her.  - Nutritional counseling repeated at each appointment due to patients tendency to fall back in to old habits.  - The patient admits there is a room for improvement in their diet and drink choices. -  Suggestion is made for the patient to avoid simple carbohydrates from their diet including Cakes, Sweet Desserts / Pastries, Ice Cream, Soda (diet and regular), Sweet Tea, Candies, Chips, Cookies, Sweet Pastries, Store Bought Juices, Alcohol in Excess of 1-2 drinks a day, Artificial Sweeteners, Coffee Creamer, and "Sugar-free" Products. This will help patient to have stable blood glucose profile and potentially avoid unintended weight gain.   - I encouraged the patient to switch to unprocessed or minimally processed complex starch and increased protein intake (animal or plant source), fruits, and vegetables.   - Patient is advised to stick to a routine mealtimes to eat 3 meals a day and avoid unnecessary snacks (to snack only to correct hypoglycemia).  - I have approached her with the following individualized plan to manage  her diabetes and patient agrees:   -Since her recent hospitalization, her mother has assumed responsibility for her diabetes management.  Number 1 priority will be to avoid hypoglycemia for her given her comorbidities.    -Given her continued hyperglycemia, she is advised to increase her 70/30 to 70 units at breakfast and supper if glucose is above 90 and she is eating.  She can continue her Metformin 1000 mg po twice daily with meals, Glipizide 5 mg XL daily with breakfast, and Victoza 1.8 mg SQ daily.  We discussed in detail the need for her to comply with dietary restrictions.  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  She could benefit from seeking out a counselor for positive coping mechanisms and is advised to discuss with her PCP.   -She is encouraged to continue using CGM to monitor glucose 4 times daily, before meals and before bed and log on the clinic sheets provided.  They are instructed to call the clinic if she has readings less than 70 or greater than 300 for 3 tests in a row.     - Specific targets for  A1c;  LDL, HDL,  and Triglycerides were discussed with the patient.  2) Blood Pressure /Hypertension: Her blood pressure is controlled to target.  She is advised to continue Lasix 80 mg po daily, continue Lisinopril 5 mg po daily, continue Propanolol 10 mg po TID, and Aldactone 100 mg o daily  3) Lipids/Hyperlipidemia:   Review of her recent lipid profile from 07/23/20 shows uncontrolled LDL at 116 and elevated triglycerides of 197.  She is advised to continue Crestor 5 mg po daily at bedtime.  Side effects and precautions discussed with her.  4)  Weight/Diet:  Her Body mass index is 42.68 kg/m.  -   clearly complicating her diabetes care.  She has recently lost  8 lbs. she is  a candidate for modest weight loss. I discussed with her the fact that loss of 5 - 10% of her  current body weight will have the most impact on her diabetes management.  Exercise, and detailed carbohydrates information provided  -  detailed on discharge instructions.  5) Chronic Care/Health Maintenance: -she is on ACE and statin medications and is encouraged to initiate and continue to follow up with Ophthalmology, Dentist,  Podiatrist at least yearly or according to recommendations, and advised to stay away from smoking. I have recommended yearly flu vaccine and pneumonia vaccine at least every 5 years; moderate intensity exercise for up to 150 minutes weekly; and  sleep for at least 7 hours a day.  - she is advised to maintain close follow up with Hasanaj, Samul Dada, MD for primary care needs, as well as her other  providers for optimal and coordinated care.         I spent 41 minutes in the care of the patient today including review of labs from [facility], Lipids, Thyroid Function, Hematology (current and previous including abstractions from other facilities); face-to-face time discussing  her blood glucose readings/logs, discussing hypoglycemia and hyperglycemia episodes and symptoms, medications doses, her options of short and long term treatment based on the latest standards of care / guidelines;  discussion about incorporating lifestyle medicine;  and documenting the encounter.    Please refer to Patient Instructions for Blood Glucose Monitoring and Insulin/Medications Dosing Guide"  in media tab for additional information. Please  also refer to " Patient Self Inventory" in the Media  tab for reviewed elements of pertinent patient history.  [facility] participated in the discussions, expressed understanding, and voiced agreement with the above plans.  All questions were answered to her satisfaction. she is encouraged to contact clinic should she have any questions or concerns prior to her return visit.  Follow up plan: - Return keep originally scheduled appt, for Diabetes F/U.  Rayetta Pigg, [facility] [address] [place], [place] [address] Phone: [phone] Fax: [phone]   03/26/2021, 9:57 AM

## 2021-03-26 NOTE — Patient Instructions (Signed)

## 2021-04-03 ENCOUNTER — Other Ambulatory Visit: Payer: Self-pay

## 2021-04-15 ENCOUNTER — Telehealth: Payer: Self-pay

## 2021-04-15 NOTE — Telephone Encounter (Signed)
Decline. She was discharged from the clinic.

## 2021-04-15 NOTE — Telephone Encounter (Signed)
received fax requesting a refill on the venlafaxine er 15m

## 2021-05-14 ENCOUNTER — Other Ambulatory Visit: Payer: Medicaid Other | Admitting: Advanced Practice Midwife

## 2021-05-19 ENCOUNTER — Emergency Department (HOSPITAL_COMMUNITY)
Admission: EM | Admit: 2021-05-19 | Discharge: 2021-05-19 | Disposition: A | Discharge status: Home / Self Care | Payer: Medicaid Other | Attending: Emergency Medicine | Admitting: Emergency Medicine

## 2021-05-19 ENCOUNTER — Other Ambulatory Visit: Payer: Self-pay

## 2021-05-19 DIAGNOSIS — E119 Type 2 diabetes mellitus without complications: Secondary | ICD-10-CM | POA: Diagnosis not present

## 2021-05-19 DIAGNOSIS — Z794 Long term (current) use of insulin: Secondary | ICD-10-CM | POA: Insufficient documentation

## 2021-05-19 DIAGNOSIS — J449 Chronic obstructive pulmonary disease, unspecified: Secondary | ICD-10-CM | POA: Insufficient documentation

## 2021-05-19 DIAGNOSIS — F17210 Nicotine dependence, cigarettes, uncomplicated: Secondary | ICD-10-CM | POA: Insufficient documentation

## 2021-05-19 DIAGNOSIS — X58XXXA Exposure to other specified factors, initial encounter: Secondary | ICD-10-CM | POA: Insufficient documentation

## 2021-05-19 DIAGNOSIS — T07XXXA Unspecified multiple injuries, initial encounter: Secondary | ICD-10-CM

## 2021-05-19 DIAGNOSIS — J45909 Unspecified asthma, uncomplicated: Secondary | ICD-10-CM | POA: Diagnosis not present

## 2021-05-19 DIAGNOSIS — S31809A Unspecified open wound of unspecified buttock, initial encounter: Secondary | ICD-10-CM | POA: Diagnosis not present

## 2021-05-19 DIAGNOSIS — S3992XA Unspecified injury of lower back, initial encounter: Secondary | ICD-10-CM | POA: Diagnosis present

## 2021-05-19 DIAGNOSIS — Z7984 Long term (current) use of oral hypoglycemic drugs: Secondary | ICD-10-CM | POA: Diagnosis not present

## 2021-05-19 DIAGNOSIS — Z7982 Long term (current) use of aspirin: Secondary | ICD-10-CM | POA: Insufficient documentation

## 2021-05-19 MED ORDER — CEPHALEXIN 500 MG PO CAPS: 500.0000 mg | ORAL_CAPSULE | Freq: Four times a day (QID) | ORAL | 0 refills | Status: DC

## 2021-05-19 MED ORDER — NYSTATIN 100000 UNIT/GM EX POWD: 1.0000 "application " | Freq: Three times a day (TID) | CUTANEOUS | 0 refills | Status: DC

## 2021-05-19 MED ORDER — CEPHALEXIN 500 MG PO CAPS
1000.0000 mg | ORAL_CAPSULE | Freq: Once | ORAL | Status: AC
  Administered 2021-05-19: 1000 mg via ORAL
  Filled 2021-05-19: qty 2

## 2021-05-19 MED ORDER — OXYCODONE HCL 5 MG PO TABS: 5.0000 mg | ORAL_TABLET | Freq: Two times a day (BID) | ORAL | 0 refills | Status: DC | PRN

## 2021-05-19 MED ORDER — OXYCODONE HCL 5 MG PO TABS
5.0000 mg | ORAL_TABLET | Freq: Once | ORAL | Status: AC
  Administered 2021-05-19: 5 mg via ORAL
  Filled 2021-05-19: qty 1

## 2021-05-19 MED ORDER — NYSTATIN 100000 UNIT/GM EX POWD
Freq: Once | CUTANEOUS | Status: AC
  Filled 2021-05-19: qty 15

## 2021-05-19 NOTE — ED Triage Notes (Signed)
Pt states she "has a sore on her bottom" that is draining. Is unable to sit down. Has been there 2 weeks.

## 2021-05-19 NOTE — ED Provider Notes (Signed)
[facility] EMERGENCY DEPARTMENT Provider Note   CSN: [id number] Arrival date & time: 05/19/21  0507     History Chief Complaint  Patient presents with   Abscess    Jaime [name] is a 35 y.o. female.  Patient with three wounds to gluteal cleft and one over lying coccyx area. Habe been there for a couple weeks. Draining some type of fluid. Has seen pcp who put a patch on it but unsure what the patch is called. No fevers. Just persistent pain. No pain/difficulty with defecating.    Abscess     Past Medical History:  Diagnosis Date   Anxiety    Asthma    Chronic abdominal pain    Chronic back pain    Cirrhosis ([place])    Cirrhosis ([place])    COPD (chronic obstructive pulmonary disease) ([place])    Depression    Diabetes mellitus without complication ([place])    Diabetes mellitus, type II ([place])    Hepatitis C    Insomnia    Long-term current use of methadone for opiate dependence ([place])    Lupus ([place])    Migraine headache    Neuropathy    Nocturnal seizures ([facility])    Peptic ulcer    Spleen enlarged     Patient Active Problem List   Diagnosis Date Noted   Adjustment disorder with depressed mood 09/07/2020   Respiratory failure ([place]) 08/31/2020   Acute encephalopathy 08/31/2020   Hyponatremia 08/16/2020   Volume overload 08/14/2020   Dyspnea 07/29/2020   Nexplanon insertion 05/20/2020   Nexplanon removal 05/20/2020   Pressure injury of skin 01/29/2019   Uncontrolled diabetes mellitus ([place]) 09/19/2018   Hyperglycemia 09/17/2018   Acute respiratory failure with hypoxia ([place]) 09/15/2018   Anxiety 09/15/2018   Chronic abdominal pain 09/15/2018   Diabetes mellitus without complication ([place]) 65/53/7482   Hepatitis C 09/15/2018   Peptic ulcer 09/15/2018   Long-term current use of methadone for opiate dependence ([place]) 09/15/2018   Cirrhosis ([place]) 09/15/2018   Acute hypoxemic respiratory failure ([place]) 07/17/2016   Pleural effusion on right 07/17/2016   Multiple rib  fractures involving four or more ribs 07/17/2016   Hemothorax on right 07/17/2016   Chronic pain disorder 07/17/2016    Past Surgical History:  Procedure Laterality Date   CHOLECYSTECTOMY       OB History     Gravida  0   Para  0   Term  0   Preterm  0   AB  0   Living  0      SAB  0   IAB  0   Ectopic  0   Multiple  0   Live Births  0           Family History  Problem Relation Age of Onset   Hypertension Mother    Hyperlipidemia Mother    Hyperlipidemia Maternal Grandfather     Social History   Tobacco Use   Smoking status: Every Day    Packs/day: 0.00    Years: 5.00    Pack years: 0.00    Types: E-cigarettes, Cigarettes   Smokeless tobacco: Never  Vaping Use   Vaping Use: Every day   Substances: Nicotine, Flavoring  Substance Use Topics   Alcohol use: No   Drug use: No    Home Medications Prior to Admission medications   Medication Sig Start Date End Date Taking? Authorizing Provider  cephALEXin (KEFLEX) 500 MG capsule Take 1 capsule (500 mg total)  by mouth 4 (four) times daily. 05/19/21  Yes Dale Strausser, Corene Cornea, MD  nystatin (MYCOSTATIN/NYSTOP) powder Apply 1 application topically 3 (three) times daily. 05/19/21  Yes Samyak Sackmann, Corene Cornea, MD  oxyCODONE (ROXICODONE) 5 MG immediate release tablet Take 1 tablet (5 mg total) by mouth every 12 (twelve) hours as needed for breakthrough pain or severe pain. 05/19/21  Yes Zyiere Rosemond, Corene Cornea, MD  Accu-Chek Softclix Lancets lancets 4 (four) times daily. 07/07/20   [provider]  albuterol (ACCUNEB) 0.63 MG/3ML nebulizer solution Take by nebulization 4 (four) times daily. 07/27/20   [provider]  albuterol (PROVENTIL) (2.5 MG/3ML) 0.083% nebulizer solution Take 2.5 mg by nebulization every 6 (six) hours as needed for wheezing or shortness of breath.    [provider]  ARIPiprazole (ABILIFY) 2 MG tablet Take 1 tablet (2 mg total) by mouth daily. Patient taking differently: Take 5 mg by mouth  daily. 01/24/21   Norman Clay, MD  aspirin EC 81 MG tablet Take 162 mg by mouth daily. Swallow whole.    [provider]  cetirizine (ZYRTEC ALLERGY) 10 MG tablet Take 1 tablet (10 mg total) by mouth daily. 07/09/20   Avegno, Darrelyn [place], FNP  clonazePAM (KLONOPIN) 0.5 MG tablet Take 1 tablet (0.5 mg total) by mouth 2 (two) times daily as needed for anxiety. 09/09/20 12/08/20  Shelly Coss, MD  Continuous Blood Gluc Sensor (DEXCOM G6 SENSOR) MISC 4 Pieces by Does not apply route once a week. 02/05/21   Brita Romp, NP  Continuous Blood Gluc Transmit (DEXCOM G6 TRANSMITTER) MISC 1 Piece by Does not apply route as directed. 02/05/21   Brita Romp, NP  etonogestrel (NEXPLANON) 68 MG IMPL implant 1 each by Subdermal route once.    [provider]  furosemide (LASIX) 40 MG tablet Take 80 mg by mouth daily.  05/22/20   [provider]  glipiZIDE (GLUCOTROL XL) 5 MG 24 hr tablet Take 1 tablet (5 mg total) by mouth daily with breakfast. 02/26/21   Brita Romp, NP  glucose blood (ACCU-CHEK GUIDE) test strip Use as instructed to check blood glucose four times daily 06/07/20   Cassandria Anger, MD  guaiFENesin (MUCINEX) 600 MG 12 hr tablet Take by mouth 2 (two) times daily.    [provider]  hyoscyamine (ANASPAZ) 0.125 MG TBDP disintergrating tablet Place 0.125 mg under the tongue 3 (three) times daily as needed for bladder spasms or cramping (Patient Preference).  08/05/20   [provider]  insulin isophane & regular human (HUMULIN 70/30 MIX) (70-30) 100 UNIT/ML KwikPen Inject 70 Units into the skin 2 (two) times daily. Inject 90 units with breakfast and 80 units with supper 03/26/21   Brita Romp, NP  Insulin Pen Needle (PEN NEEDLES) 32G X 6 MM MISC 1 each by Does not apply route 3 (three) times daily. 02/26/21   Brita Romp, NP  lactulose (CHRONULAC) 10 GM/15ML solution Take 30 mLs (20 g total) by mouth 3 (three) times daily. 09/09/20    Shelly Coss, MD  liraglutide (VICTOZA) 18 MG/3ML SOPN Inject 1.8 mg into the skin daily. 02/26/21   Brita Romp, NP  lisinopril (ZESTRIL) 5 MG tablet Take 1 tablet (5 mg total) by mouth daily. 06/06/20   Brita Romp, NP  metFORMIN (GLUCOPHAGE) 1000 MG tablet Take 1 tablet (1,000 mg total) by mouth 2 (two) times daily with a meal. 11/27/20   Brita Romp, NP  methadone (DOLOPHINE) 10 MG/5ML solution Take 30 mg  by mouth every morning. 87m    [provider]  Multiple Vitamin (MULTIVITAMIN) tablet Take 1 tablet by mouth daily.    [provider]  ondansetron (ZOFRAN-ODT) 4 MG disintegrating tablet Take 4 mg by mouth every 8 (eight) hours as needed for nausea.  06/14/20   [provider]  pantoprazole (PROTONIX) 40 MG tablet Take 1 tablet (40 mg total) by mouth daily. 09/09/20   AShelly Coss MD  potassium chloride (KLOR-CON) 10 MEQ tablet Take 20 mEq by mouth daily.  04/24/20   [provider]  pregabalin (LYRICA) 75 MG capsule Take 75 mg by mouth in the morning, at noon, in the evening, and at bedtime.     [provider]  propranolol (INDERAL) 10 MG tablet Take 10 mg by mouth 3 (three) times daily. 08/08/20   [provider]  RESTASIS 0.05 % ophthalmic emulsion Place 1 drop into both eyes as needed (dry eye).  04/22/19   [provider]  rosuvastatin (CRESTOR) 5 MG tablet Take 1 tablet (5 mg total) by mouth daily. 08/06/20   RBrita Romp NP  spironolactone (ALDACTONE) 100 MG tablet Take 100 mg by mouth daily.  05/22/20   [provider]  sucralfate (CARAFATE) 1 g tablet Take 1 g by mouth 4 (four) times daily. 07/02/20   [provider]  triamcinolone (KENALOG) 0.1 % Apply topically daily. 02/04/21   [provider]  venlafaxine XR (EFFEXOR-XR) 150 MG 24 hr capsule Take total of 225 mg daily (150 mg + 75 mg ) 01/24/21   HNorman Clay MD  venlafaxine XR (EFFEXOR-XR) 75 MG 24 hr capsule To take  along with 1565mfor a total of 225 mg daily 01/20/21   HiNorman ClayMD    Allergies    Iodine-131, Ivp dye [iodinated diagnostic agents], Ketorolac tromethamine, Tylenol [acetaminophen], Gabapentin, Morphine and related, Nsaids, Suboxone [buprenorphine hcl-naloxone hcl], and Vancomycin  Review of Systems   Review of Systems  All other systems reviewed and are negative.  Physical Exam Updated Vital Signs BP 132/82 (BP Location: Left Arm)   Pulse (!) 101   Temp 98.9 F (37.2 C) (Oral)   Ht 5' 9"  (1.753 m)   Wt 131.1 kg   SpO2 95%   BMI 42.68 kg/m   Physical Exam Vitals and nursing note reviewed.  Constitutional:      Appearance: She is well-developed.  HENT:     Head: Normocephalic and atraumatic.     Mouth/Throat:     Mouth: Mucous membranes are moist.  Eyes:     Pupils: Pupils are equal, round, and reactive to light.  Cardiovascular:     Rate and Rhythm: Normal rate and regular rhythm.  Pulmonary:     Effort: No respiratory distress.     Breath sounds: No stridor.  Abdominal:     General: There is no distension.  Musculoskeletal:     Cervical back: Normal range of motion.  Skin:    General: Skin is warm and dry.     Comments: Exam chaperoned by nurse: KaLonn GeorgiaOne open lesion over coccyx approximately 1.5x1 cm  Two open lesions on left side of gluteal cleft each about nickel sized with mild surrounding erythema. No malodorous discharge. No induration. No fluctuance.   Neurological:     General: No focal deficit present.     Mental Status: She is alert.    ED Results / Procedures / Treatments   Labs (all labs ordered are listed, but only  abnormal results are displayed) Labs Reviewed - No data to display  EKG None  Radiology No results found.  Procedures Procedures   Medications Ordered in ED Medications  cephALEXin (KEFLEX) capsule 1,000 mg (has no administration in time range)  nystatin (MYCOSTATIN/NYSTOP) topical powder (has no administration in  time range)  oxyCODONE (Oxy IR/ROXICODONE) immediate release tablet 5 mg (has no administration in time range)    ED Course  I have reviewed the triage vital signs and the nursing notes.  Pertinent labs & imaging results that were available during my care of the patient were reviewed by me and considered in my medical decision making (see chart for details).    MDM Rules/Calculators/A&P                          Maybe some mild cellulitis? Otherwise appears to be just open skin. No e/o abscess. Will treat for fungal and possibly bacterial infections, however likely needs wound care. Will refer for same. No indication for imaging/labs or hospitalization at this time.   Final Clinical Impression(s) / ED Diagnoses Final diagnoses:  Wounds, multiple    Rx / DC Orders ED Discharge Orders          Ordered    AMB referral to wound care center        05/19/21 0530    nystatin (MYCOSTATIN/NYSTOP) powder  3 times daily        05/19/21 0531    cephALEXin (KEFLEX) 500 MG capsule  4 times daily        05/19/21 0531    oxyCODONE (ROXICODONE) 5 MG immediate release tablet  Every 12 hours PRN        05/19/21 0531             Laymon Stockert, Corene Cornea, MD 05/19/21 [phone]

## 2021-05-19 NOTE — ED Notes (Signed)
Pt to get her "Jaime [name]" to take her home after receiving oxycodone.

## 2021-05-20 ENCOUNTER — Telehealth: Payer: Self-pay

## 2021-05-20 DIAGNOSIS — E1165 Type 2 diabetes mellitus with hyperglycemia: Secondary | ICD-10-CM

## 2021-05-20 MED ORDER — ACCU-CHEK GUIDE VI STRP: ORAL_STRIP | 5 refills | Status: DC

## 2021-05-20 NOTE — Telephone Encounter (Signed)
Pt has switched to [facility] and they are requesting a new RX to be sent there for her glucose blood (ACCU-CHEK GUIDE) test strip

## 2021-05-27 NOTE — Patient Instructions (Signed)

## 2021-05-28 ENCOUNTER — Encounter: Payer: Self-pay | Admitting: Nurse Practitioner

## 2021-05-28 ENCOUNTER — Encounter: Payer: Medicaid Other | Attending: Nurse Practitioner | Admitting: Nutrition

## 2021-05-28 ENCOUNTER — Other Ambulatory Visit: Payer: Self-pay

## 2021-05-28 ENCOUNTER — Ambulatory Visit (INDEPENDENT_AMBULATORY_CARE_PROVIDER_SITE_OTHER): Payer: Medicaid Other | Admitting: Nurse Practitioner

## 2021-05-28 VITALS — Ht 70.0 in | Wt 301.0 lb

## 2021-05-28 VITALS — BP 115/76 | HR 91 | Ht 69.0 in | Wt 301.0 lb

## 2021-05-28 DIAGNOSIS — E1165 Type 2 diabetes mellitus with hyperglycemia: Secondary | ICD-10-CM | POA: Insufficient documentation

## 2021-05-28 DIAGNOSIS — I10 Essential (primary) hypertension: Secondary | ICD-10-CM | POA: Diagnosis present

## 2021-05-28 DIAGNOSIS — K7460 Unspecified cirrhosis of liver: Secondary | ICD-10-CM | POA: Insufficient documentation

## 2021-05-28 DIAGNOSIS — Z91119 Patient's noncompliance with dietary regimen due to unspecified reason: Secondary | ICD-10-CM

## 2021-05-28 DIAGNOSIS — E782 Mixed hyperlipidemia: Secondary | ICD-10-CM | POA: Insufficient documentation

## 2021-05-28 DIAGNOSIS — E118 Type 2 diabetes mellitus with unspecified complications: Secondary | ICD-10-CM | POA: Diagnosis not present

## 2021-05-28 DIAGNOSIS — IMO0002 Reserved for concepts with insufficient information to code with codable children: Secondary | ICD-10-CM

## 2021-05-28 DIAGNOSIS — Z9111 Patient's noncompliance with dietary regimen: Secondary | ICD-10-CM | POA: Diagnosis not present

## 2021-05-28 LAB — POCT GLYCOSYLATED HEMOGLOBIN (HGB A1C): HbA1c, POC (controlled diabetic range): 10.3 % — AB (ref 0.0–7.0)

## 2021-05-28 MED ORDER — INSULIN ISOPHANE & REGULAR (HUMAN 70-30)100 UNIT/ML KWIKPEN: 90.0000 [IU] | PEN_INJECTOR | Freq: Two times a day (BID) | SUBCUTANEOUS | 3 refills | Status: DC

## 2021-05-28 MED ORDER — DEXCOM G6 SENSOR MISC: 4.0000 | 2 refills | Status: DC

## 2021-05-28 NOTE — Patient Instructions (Signed)
Goals  Cut out tea, Add oatmeal for breakfast with protein shake. soda and sugared cereals Eat a [place] sandwich instead of sugared cereals Call Dr. Sherrie Sport to ask  about fluid and iron and ask about seeing a BT. Try True Lemon in water and Unsweet tea Increase walking at home. Increase 70/30 to 90 units twice a day. Put the Dexcom back on.   Wand Dexcom 4 times per day  Lose 1-2 lbs per week Goal am blood sugar less than 130 and bedtime less than 150 mg/dl.

## 2021-05-28 NOTE — Addendum Note (Signed)
Addended by: Brita Romp on: 05/28/2021 10:48 AM   Modules accepted: Orders

## 2021-05-28 NOTE — Progress Notes (Signed)
05/28/2021, 10:43 AM     Endocrinology Follow Up Visit  Subjective:    Patient ID: Jaime [name], female    DOB: 09/23/1986.  [facility] is being seen in follow up for management of currently uncontrolled symptomatic diabetes requested by  Neale Burly, MD.   Past Medical History:  Diagnosis Date   Anxiety    Asthma    Chronic abdominal pain    Chronic back pain    Cirrhosis ([place])    Cirrhosis ([place])    COPD (chronic obstructive pulmonary disease) ([place])    Depression    Diabetes mellitus without complication ([place])    Diabetes mellitus, type II ([place])    Hepatitis C    Insomnia    Long-term current use of methadone for opiate dependence ([place])    Lupus ([place])    Migraine headache    Neuropathy    Nocturnal seizures ([place])    Peptic ulcer    Spleen enlarged     Past Surgical History:  Procedure Laterality Date   CHOLECYSTECTOMY      Social History   Socioeconomic History   Marital status: Single    Spouse name: Not on file   Number of children: Not on file   Years of education: Not on file   Highest education level: Not on file  Occupational History   Not on file  Tobacco Use   Smoking status: Former    Packs/day: 0.00    Years: 5.00    Pack years: 0.00    Types: E-cigarettes, Cigarettes   Smokeless tobacco: Never  Vaping Use   Vaping Use: Every day   Substances: Nicotine, Flavoring  Substance and Sexual Activity   Alcohol use: No   Drug use: No   Sexual activity: Not Currently    Birth control/protection: Implant  Other Topics Concern   Not on file  Social History Narrative   Not on file   Social Determinants of Health   Financial Resource Strain: Not on file  Food Insecurity: Not on file  Transportation Needs: Not on file  Physical Activity: Not on file  Stress: Not on file  Social Connections: Not on file    Family History  Problem Relation Age  of Onset   Hypertension Mother    Hyperlipidemia Mother    Hyperlipidemia Maternal Grandfather     Outpatient Encounter Medications as of 05/28/2021  Medication Sig   Accu-Chek Softclix Lancets lancets 4 (four) times daily.   albuterol (ACCUNEB) 0.63 MG/3ML nebulizer solution Take by nebulization 4 (four) times daily.   albuterol (PROVENTIL) (2.5 MG/3ML) 0.083% nebulizer solution Take 2.5 mg by nebulization every 6 (six) hours as needed for wheezing or shortness of breath.   ARIPiprazole (ABILIFY) 2 MG tablet Take 1 tablet (2 mg total) by mouth daily. (Patient taking differently: Take 5 mg by mouth daily.)   aspirin EC 81 MG tablet Take 162 mg by mouth daily. Swallow whole.   cetirizine (ZYRTEC ALLERGY) 10 MG tablet Take 1 tablet (10 mg total) by mouth daily.   clonazePAM (KLONOPIN) 0.5 MG tablet Take 1 tablet (0.5 mg total)  by mouth 2 (two) times daily as needed for anxiety.   Continuous Blood Gluc Sensor (DEXCOM G6 SENSOR) MISC 4 Pieces by Does not apply route once a week.   Continuous Blood Gluc Transmit (DEXCOM G6 TRANSMITTER) MISC 1 Piece by Does not apply route as directed.   etonogestrel (NEXPLANON) 68 MG IMPL implant 1 each by Subdermal route once.   furosemide (LASIX) 40 MG tablet Take 80 mg by mouth daily.    glipiZIDE (GLUCOTROL XL) 5 MG 24 hr tablet Take 1 tablet (5 mg total) by mouth daily with breakfast.   glucose blood (ACCU-CHEK GUIDE) test strip Use as instructed to check blood glucose four times daily   hyoscyamine (ANASPAZ) 0.125 MG TBDP disintergrating tablet Place 0.125 mg under the tongue 3 (three) times daily as needed for bladder spasms or cramping (Patient Preference).    insulin isophane & regular human KwikPen (HUMULIN 70/30 MIX) (70-30) 100 UNIT/ML KwikPen Inject 90 Units into the skin 2 (two) times daily. Inject 90 units with breakfast and 80 units with supper   Insulin Pen Needle (PEN NEEDLES) 32G X 6 MM MISC 1 each by Does not apply route 3 (three) times daily.    lactulose (CHRONULAC) 10 GM/15ML solution Take 30 mLs (20 g total) by mouth 3 (three) times daily. (Patient not taking: Reported on 05/28/2021)   liraglutide (VICTOZA) 18 MG/3ML SOPN Inject 1.8 mg into the skin daily.   lisinopril (ZESTRIL) 5 MG tablet Take 1 tablet (5 mg total) by mouth daily.   metFORMIN (GLUCOPHAGE) 1000 MG tablet Take 1 tablet (1,000 mg total) by mouth 2 (two) times daily with a meal.   methadone (DOLOPHINE) 10 MG/5ML solution Take 60 mg by mouth every morning. 43m   Multiple Vitamin (MULTIVITAMIN) tablet Take 1 tablet by mouth daily.   nystatin (MYCOSTATIN/NYSTOP) powder Apply 1 application topically 3 (three) times daily.   ondansetron (ZOFRAN-ODT) 4 MG disintegrating tablet Take 4 mg by mouth every 8 (eight) hours as needed for nausea.    oxyCODONE (ROXICODONE) 5 MG immediate release tablet Take 1 tablet (5 mg total) by mouth every 12 (twelve) hours as needed for breakthrough pain or severe pain.   pantoprazole (PROTONIX) 40 MG tablet Take 1 tablet (40 mg total) by mouth daily.   potassium chloride (KLOR-CON) 10 MEQ tablet Take 20 mEq by mouth daily.    pregabalin (LYRICA) 75 MG capsule Take 75 mg by mouth in the morning, at noon, in the evening, and at bedtime.    propranolol (INDERAL) 10 MG tablet Take 10 mg by mouth 3 (three) times daily.   RESTASIS 0.05 % ophthalmic emulsion Place 1 drop into both eyes as needed (dry eye).    rosuvastatin (CRESTOR) 5 MG tablet Take 1 tablet (5 mg total) by mouth daily.   spironolactone (ALDACTONE) 100 MG tablet Take 100 mg by mouth daily.    sucralfate (CARAFATE) 1 g tablet Take 1 g by mouth 4 (four) times daily.   triamcinolone (KENALOG) 0.1 % Apply topically daily.   venlafaxine XR (EFFEXOR-XR) 150 MG 24 hr capsule Take total of 225 mg daily (150 mg + 75 mg )   venlafaxine XR (EFFEXOR-XR) 75 MG 24 hr capsule To take along with [id number] a total of 225 mg daily   [DISCONTINUED] cephALEXin (KEFLEX) 500 MG capsule Take 1 capsule (500 mg  total) by mouth 4 (four) times daily.   [DISCONTINUED] guaiFENesin (MUCINEX) 600 MG 12 hr tablet Take by mouth 2 (two) times daily.   [  DISCONTINUED] insulin isophane & regular human (HUMULIN 70/30 MIX) (70-30) 100 UNIT/ML KwikPen Inject 70 Units into the skin 2 (two) times daily. Inject 90 units with breakfast and 80 units with supper   No facility-administered encounter medications on file as of 05/28/2021.    ALLERGIES: Allergies  Allergen Reactions   Iodine-131 Anaphylaxis, Shortness Of Breath and Swelling   Ivp Dye [Iodinated Diagnostic Agents] Anaphylaxis    Skin gets very red, unable to walk    Ketorolac Tromethamine Shortness Of Breath   Tylenol [Acetaminophen] Other (See Comments)    Due to cirrhosis    Gabapentin    Morphine And Related     Pt says she is not allergic to Morphine 08/12/20   Nsaids Other (See Comments)    Flares ulcers   Suboxone [Buprenorphine Hcl-Naloxone Hcl]    Vancomycin Swelling    Facial swelling,     VACCINATION STATUS: Immunization History  Administered Date(s) Administered   Influenza,inj,Quad PF,6+ Mos 07/18/2016   Moderna Sars-Covid-2 Vaccination 03/07/2020, 04/04/2020   Pneumococcal Polysaccharide-23 07/18/2016, 08/01/2020    Diabetes She presents for her follow-up diabetic visit. She has type 2 diabetes mellitus. Onset time: She was diagnosed at approximate age of 26 years. Her disease course has been worsening. There are no hypoglycemic associated symptoms. Pertinent negatives for hypoglycemia include no headaches, nervousness/anxiousness, pallor, sweats or tremors. Associated symptoms include blurred vision, fatigue, foot paresthesias, polydipsia and polyuria. Pertinent negatives for diabetes include no chest pain, no polyphagia and no weight loss. There are no hypoglycemic complications. (History of unresponsiveness requiring EMS and hospitalization) Symptoms are worsening. Diabetic complications include heart disease and peripheral  neuropathy. Risk factors for coronary artery disease include diabetes mellitus, obesity, sedentary lifestyle, dyslipidemia and hypertension. Current diabetic treatment includes insulin injections and oral agent (monotherapy). She is compliant with treatment most of the time. Her weight is increasing rapidly. She is following a low salt and generally unhealthy diet. When asked about meal planning, she reported none. She has had a previous visit with a dietitian. She participates in exercise intermittently. Her home blood glucose trend is increasing rapidly. Her breakfast blood glucose range is generally >200 mg/dl. Her lunch blood glucose range is generally >200 mg/dl. Her dinner blood glucose range is generally >200 mg/dl. Her bedtime blood glucose range is generally >200 mg/dl. Her overall blood glucose range is >200 mg/dl. (She presents today, accompanied by her mother, with her logs, no meter, showing significant hyperglycemia, both fasting and postprandially.  Her POCT A1c today is 10.3%, increasing from last visit of 8.9%.  She admits to falling back into old habits as far as her diet goes.  She has also been dealing with a wound infection, requiring multiple antibiotics.  She denies any hypoglycemia.) An ACE inhibitor/angiotensin II receptor blocker is being taken. She does not see a podiatrist.Eye exam is not current.  Hypertension This is a chronic problem. The current episode started more than 1 month ago. The problem is unchanged. The problem is controlled. Associated symptoms include blurred vision and peripheral edema. Pertinent negatives include no chest pain, headaches, palpitations, shortness of breath or sweats. There are no associated agents to hypertension. Risk factors for coronary artery disease include diabetes mellitus, dyslipidemia, obesity and sedentary lifestyle. Past treatments include diuretics and beta blockers. The current treatment provides no improvement. There are no compliance  problems.     Review of systems  Constitutional: + Minimally fluctuating body weight,  current Body mass index is 44.45 kg/m. , + fatigue, no  subjective hyperthermia, no subjective hypothermia Eyes: no blurry vision, no xerophthalmia ENT: no sore throat, no nodules palpated in throat, no dysphagia/odynophagia, no hoarseness Cardiovascular: no chest pain, no shortness of breath, no palpitations, + leg swelling Respiratory: no cough, no shortness of breath Gastrointestinal: no nausea/vomiting/diarrhea Genitourinary: + polyuria Musculoskeletal: no muscle/joint aches Skin: no rashes, no hyperemia, scabbed area to right knee from recent fall Neurological: no tremors, no numbness, no tingling, no dizziness Psychiatric: no depression, no anxiety  Objective:     BP 115/76   Pulse 91   Ht 5' 9"  (1.753 m)   Wt (!) 301 lb (136.5 kg)   BMI 44.45 kg/m   Wt Readings from Last 3 Encounters:  05/28/21 (!) 301 lb (136.5 kg)  05/28/21 (!) 301 lb (136.5 kg)  05/19/21 289 lb 0.4 oz (131.1 kg)    BP Readings from Last 3 Encounters:  05/28/21 115/76  05/19/21 127/83  03/26/21 123/82      Physical Exam- Limited  Constitutional:  Body mass index is 44.45 kg/m. , not in acute distress, normal state of mind Eyes:  EOMI, no exophthalmos Neck: Supple Cardiovascular: RRR, no murmurs, rubs, or gallops, + pitting edema to BLE Respiratory: Adequate breathing efforts, no crackles, rales, rhonchi, or wheezing Musculoskeletal: no gross deformities, strength intact in all four extremities, no gross restriction of joint movements Skin:  no rashes, no hyperemia Neurological: no tremor with outstretched hands    CMP ( most recent) CMP     Component Value Date/Time   NA 135 02/19/2021 0822   K 3.8 02/19/2021 0822   CL 99 02/19/2021 0822   CO2 24 02/19/2021 0822   GLUCOSE 409 (H) 02/19/2021 0822   GLUCOSE 125 (H) 09/09/2020 0226   BUN 6 02/19/2021 0822   CREATININE 0.59 02/19/2021 0822    CALCIUM 8.7 02/19/2021 0822   PROT 7.0 02/19/2021 0822   ALBUMIN 3.6 (L) 02/19/2021 0822   AST 31 02/19/2021 0822   ALT 28 02/19/2021 0822   ALKPHOS 139 (H) 02/19/2021 0822   BILITOT 0.5 02/19/2021 0822   GFRNONAA >60 09/09/2020 0226   GFRAA >60 08/01/2020 0704     Diabetic Labs (most recent): Lab Results  Component Value Date   HGBA1C 10.3 (A) 05/28/2021   HGBA1C 8.9 (A) 02/26/2021   HGBA1C 10.1 (A) 11/27/2020     Lipid Panel ( most recent) Lipid Panel     Component Value Date/Time   CHOL 210 (H) 07/23/2020 0842   TRIG 86 08/12/2020 1023   HDL 60 07/23/2020 0842   CHOLHDL 3.5 07/23/2020 0842   LDLCALC 116 (H) 07/23/2020 0842   LABVLDL 34 07/23/2020 0842      Lab Results  Component Value Date   TSH 0.635 09/08/2020   TSH 0.993 07/30/2020   TSH 1.990 07/23/2020   TSH 1.690 08/22/2010   FREET4 0.72 (L) 07/23/2020   FREET4 1.11 08/22/2010      Assessment & Plan:   1) Type 2 diabetes mellitus with hyperglycemia, without long-term current use of insulin ([place])  - Kandra Nicolas has currently uncontrolled symptomatic type 2 DM since  35 years of age.  She presents today, accompanied by her mother, with her logs, no meter, showing significant hyperglycemia, both fasting and postprandially.  Her POCT A1c today is 10.3%, increasing from last visit of 8.9%.  She admits to falling back into old habits as far as her diet goes.  She has also been dealing with a wound infection, requiring multiple antibiotics.  She denies any hypoglycemia.  - I had a long discussion with her about the progressive nature of diabetes and the pathology behind its complications.  -her diabetes is complicated by obesity/sedentary life and she remains at a high risk for more acute and chronic complications which include CAD, CVA, CKD, retinopathy, and neuropathy. These are all discussed in detail with her.  - Nutritional counseling repeated at each appointment due to patients tendency to fall  back in to old habits.  - The patient admits there is a room for improvement in their diet and drink choices. -  Suggestion is made for the patient to avoid simple carbohydrates from their diet including Cakes, Sweet Desserts / Pastries, Ice Cream, Soda (diet and regular), Sweet Tea, Candies, Chips, Cookies, Sweet Pastries, Store Bought Juices, Alcohol in Excess of 1-2 drinks a day, Artificial Sweeteners, Coffee Creamer, and "Sugar-free" Products. This will help patient to have stable blood glucose profile and potentially avoid unintended weight gain.   - I encouraged the patient to switch to unprocessed or minimally processed complex starch and increased protein intake (animal or plant source), fruits, and vegetables.   - Patient is advised to stick to a routine mealtimes to eat 3 meals a day and avoid unnecessary snacks (to snack only to correct hypoglycemia).  - I have approached her with the following individualized plan to manage  her diabetes and patient agrees:   -Since her recent hospitalization, her mother has assumed responsibility for her diabetes management.  Number 1 priority will be to avoid hypoglycemia for her given her comorbidities.    -Based on her significant hyperglycemia, she will tolerate increase in her 70/30 to 90 units SQ BID with meals if glucose is above 90 and she is eating.  She can continue Victoza 1.8 mg SQ daily, Metformin 1000 mg po twice daily with meals, and Glipizide 5 mg XL daily with breakfast.  We discussed in detail, again, the need for her to comply with dietary restrictions.  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  She could benefit from seeking out a counselor for positive coping mechanisms and is advised to discuss with her PCP.   -She is encouraged to continue to monitor glucose 4 times daily, before meals and before bed and log on the clinic sheets provided.  They are  instructed to call the clinic if she has readings less than 70 or greater than 300 for 3 tests in a row.     - Specific targets for  A1c;  LDL, HDL,  and Triglycerides were discussed with the patient.  2) Blood Pressure /Hypertension: Her blood pressure is controlled to target.  She is advised to continue Lasix 80 mg po daily, continue Lisinopril 5 mg po daily, continue Propanolol 10 mg po TID, and Aldactone 100 mg o daily  3) Lipids/Hyperlipidemia:   Review of her recent lipid profile from 07/23/20 shows uncontrolled LDL at 116 and elevated triglycerides of 197.  She is advised to continue Crestor 5 mg po daily at bedtime.  Side effects and precautions discussed with her.  Will recheck lipid panel on subsequent visits.  4)  Weight/Diet:  Her Body mass index is 44.45 kg/m.  -   clearly complicating her diabetes care.  She has recently lost 8 lbs. she is  a candidate for modest weight loss. I discussed with her the fact that loss of 5 - 10% of  her  current body weight will have the most impact on her diabetes management.  Exercise, and detailed carbohydrates information provided  -  detailed on discharge instructions.  5) Chronic Care/Health Maintenance: -she is on ACE and statin medications and is encouraged to initiate and continue to follow up with Ophthalmology, Dentist,  Podiatrist at least yearly or according to recommendations, and advised to stay away from smoking. I have recommended yearly flu vaccine and pneumonia vaccine at least every 5 years; moderate intensity exercise for up to 150 minutes weekly; and  sleep for at least 7 hours a day.  - she is advised to maintain close follow up with Hasanaj, Samul Dada, MD for primary care needs, as well as her other providers for optimal and coordinated care.    I spent 47 minutes in the care of the patient today including review of labs from [facility], Lipids, Thyroid Function, Hematology (current and previous including abstractions from other  facilities); face-to-face time discussing  her blood glucose readings/logs, discussing hypoglycemia and hyperglycemia episodes and symptoms, medications doses, her options of short and long term treatment based on the latest standards of care / guidelines;  discussion about incorporating lifestyle medicine;  and documenting the encounter.    Please refer to Patient Instructions for Blood Glucose Monitoring and Insulin/Medications Dosing Guide"  in media tab for additional information. Please  also refer to " Patient Self Inventory" in the Media  tab for reviewed elements of pertinent patient history.  [facility] participated in the discussions, expressed understanding, and voiced agreement with the above plans.  All questions were answered to her satisfaction. she is encouraged to contact clinic should she have any questions or concerns prior to her return visit.    Follow up plan: - Return in about 1 month (around 06/28/2021) for Diabetes F/U, Bring meter and logs, No previsit labs.    Rayetta Pigg, [facility] [address] [place], [place] [address] Phone: [phone] Fax: [phone]   05/28/2021, 10:43 AM

## 2021-05-28 NOTE — Progress Notes (Signed)
Medical Nutrition Therapy  Appointment Start time:  85  Appointment End time:  1045  Primary concerns today: Diabetes Type 2, Obesity Follow up Referral diagnosis: E11.8, E66.01 Preferred learning style:  no preference indicated Learning readiness:  change in progress   NUTRITION ASSESSMENT  Changes Made: She notes she has gotten back on a sugar craving. Drinking sodas and eating higher sodium foods. She wants to lose weight and get her BS back in line. Gained 11 lbs. She notes she is swollen. Will call PCP and address fluid issues. Still craves a lot of ice. Is taking iron pills. Will address the anemia again with PCP-Dr. Hasanji. Has a Dexcom but hasn't been using it. Will put it on when she gets home. Saw Rayetta Pigg, FNP today. Increased 70/30  Insulin to 90 units BID. Denies missing medications. Has a lot of support and help from her mom. Admits to some depresssion and emotional issues and would like to work with a Social worker.   Anthropometrics  Wt Readings from Last 3 Encounters:  05/28/21 (!) 301 lb (136.5 kg)  05/19/21 289 lb 0.4 oz (131.1 kg)  03/26/21 289 lb (131.1 kg)   Ht Readings from Last 3 Encounters:  05/28/21 5' 9"  (1.753 m)  05/19/21 5' 9"  (1.753 m)  03/26/21 5' 9"  (1.753 m)   There is no height or weight on file to calculate BMI. @BMIFA @ Facility age limit for growth percentiles is 20 years. Facility age limit for growth percentiles is 20 years.   Clinical Medical Hx: Dm Type 2, Obesity, Fatty Liver, Hyperlipidemia, depression Medications: Dexcom, 70/30 90 units in am and 90 units pm. VIctoza daily., Metformin 1000 mg BID and Glipizide. A1C up to 10.3%   Labs:  Lab Results  Component Value Date   HGBA1C 10.3 (A) 05/28/2021   CMP Latest Ref Rng & Units 02/19/2021 09/09/2020 09/08/2020  Glucose 65 - 99 mg/dL 409(H) 125(H) 124(H)  BUN 6 - 20 mg/dL 6 11 12   Creatinine 0.57 - 1.00 mg/dL 0.59 0.63 0.53  Sodium 134 - 144 mmol/L 135 142 143  Potassium  3.5 - 5.2 mmol/L 3.8 4.3 3.5  Chloride 96 - 106 mmol/L 99 106 108  CO2 20 - 29 mmol/L 24 27 26   Calcium 8.7 - 10.2 mg/dL 8.7 8.7(L) 8.7(L)  Total Protein 6.0 - 8.5 g/dL 7.0 - -  Total Bilirubin 0.0 - 1.2 mg/dL 0.5 - -  Alkaline Phos 44 - 121 IU/L 139(H) - -  AST 0 - 40 IU/L 31 - -  ALT 0 - 32 IU/L 28 - -   Lipid Panel     Component Value Date/Time   CHOL 210 (H) 07/23/2020 0842   TRIG 86 08/12/2020 1023   HDL 60 07/23/2020 0842   CHOLHDL 3.5 07/23/2020 0842   LDLCALC 116 (H) 07/23/2020 0842   LABVLDL 34 07/23/2020 0842    Notable Signs/Symptoms: Fatigue, increased thirst, hunger ,increased urination. Blurry vision, headaches  Lifestyle & Dietary Hx .  Estimated daily fluid intake: 32 oz Supplements: mvt Sleep: varies Stress / self-care: yes Current average weekly physical activity: walks dog  24-Hr Dietary Recall First Meal: Roast beef or [place] sandwich, sweet tea  Snack:  Second Meal: skipped due to eating a early dinner, Sweet tea Snack:  Third Meal: Pot roast turnips and corn, Sweet tea Beverages: water, some tea, diet soda  Estimated Energy Needs Calories: 1200  Carbohydrate: 135g Protein: 90g Fat: 33g   NUTRITION DIAGNOSIS  NB-1.1 Food and nutrition-related knowledge deficit  As related to Diabetes Type 2.  As evidenced by A1C 8.9%   NUTRITION INTERVENTION  Nutrition education (E-1) on the following topics:  Nutrition and Diabetes education provided on My Plate, CHO counting, meal planning, portion sizes, timing of meals, avoiding snacks between meals unless having a low blood sugar, target ranges for A1C and blood sugars, signs/symptoms and treatment of hyper/hypoglycemia, monitoring blood sugars, taking medications as prescribed, benefits of exercising 30 minutes per day and prevention of complications of DM.   Handouts Provided Include  The Plate Method  Meal Plan Card Diabetes Instructions   Learning Style & Readiness for Change Teaching method  utilized: Visual & Auditory  Demonstrated degree of understanding via: Teach Back  Barriers to learning/adherence to lifestyle change: none  Goals Established by Pt Goals  Cut out tea, Add oatmeal for breakfast with protein shake. soda and sugared cereals Eat a [place] sandwich instead of sugared cereals Call Dr. Sherrie Sport to ask  about fluid and iron and ask about seeing a BT. Try True Lemon in water and Unsweet tea Increase walking at home. Increase 70/30 to 90 units twice a day. Put the Dexcom back on.   Wand Dexcom 4 times per day  Lose 1-2 lbs per week Goal am blood sugar less than 130 and bedtime less than 150 mg/dl.    MONITORING & EVALUATION Dietary intake, weekly physical activity, and blood sugars and weight in 1 month.  Next Steps  Patient is to work on Systems analyst and meal planning.

## 2021-05-29 ENCOUNTER — Encounter: Payer: Medicaid Other | Attending: Physician Assistant | Admitting: Physician Assistant

## 2021-05-31 NOTE — Progress Notes (Signed)
EMMACLAIRE, SWITALA ([id number]) Visit Report for 05/29/2021 Abuse/Suicide Risk Screen Details Patient Name: Jaime [name], Jaime [name] Date of Service: 05/29/2021 8:00 AM Medical Record Number: [id number] Patient Account Number: [id number] Date of Birth/Sex: Sep 19, 1986 (35 y.o. F) Treating RN: Carlene Coria Primary Care Lakie Mclouth: Stoney Bang Other Clinician: Referring Brain Honeycutt: Merrily Pew Treating Louella Medaglia/Extender: Skipper Cliche in Treatment: 0 Abuse/Suicide Risk Screen Items Answer ABUSE RISK SCREEN: Has anyone close to you tried to hurt or harm you recentlyo No Do you feel uncomfortable with anyone in your familyo No Has anyone forced you do things that you didnot want to doo No Electronic Signature(s) Signed: 05/30/2021 4:32:50 PM By: Carlene Coria RN Entered By: Carlene Coria on 05/29/2021 08:31:49 Jaime [name] ([id number]) -------------------------------------------------------------------------------- Activities of Daily Living Details Patient Name: Jaime [name] Date of Service: 05/29/2021 8:00 AM Medical Record Number: [id number] Patient Account Number: [id number] Date of Birth/Sex: 06-24-1986 (35 y.o. F) Treating RN: Carlene Coria Primary Care Dilynn Munroe: Stoney Bang Other Clinician: Referring Chriss Redel: Merrily Pew Treating Oshua Mcconaha/Extender: Skipper Cliche in Treatment: 0 Activities of Daily Living Items Answer Activities of Daily Living (Please select one for each item) Drive Automobile Completely Able Take Medications Completely Able Use Telephone Completely Able Care for Appearance Completely Able Use Toilet Completely Able Bath / Shower Completely Able Dress Self Completely Able Feed Self Completely Able Walk Completely Able Get In / Out Bed Completely Able Housework Completely Able Prepare Meals Completely Able Handle Money Completely Able Shop for Self Completely Able Electronic Signature(s) Signed: 05/30/2021 4:32:50 PM By: Carlene Coria RN Entered  By: Carlene Coria on 05/29/2021 08:32:15 Jaime [name] ([id number]) -------------------------------------------------------------------------------- Education Screening Details Patient Name: Jaime [name] Date of Service: 05/29/2021 8:00 AM Medical Record Number: [id number] Patient Account Number: [id number] Date of Birth/Sex: 1985/12/27 (35 y.o. F) Treating RN: Carlene Coria Primary Care Rider Ermis: Stoney Bang Other Clinician: Referring Johnryan Sao: Merrily Pew Treating Shyane Fossum/Extender: Skipper Cliche in Treatment: 0 Primary Learner Assessed: Patient Learning Preferences/Education Level/Primary Language Learning Preference: Explanation Highest Education Level: College or Above Preferred Language: English Cognitive Barrier Language Barrier: No Translator Needed: No Memory Deficit: No Emotional Barrier: No Cultural/Religious Beliefs Affecting Medical Care: No Physical Barrier Impaired Vision: Yes Glasses Impaired Hearing: No Decreased Hand dexterity: No Knowledge/Comprehension Knowledge Level: Medium Comprehension Level: High Ability to understand written instructions: High Ability to understand verbal instructions: High Motivation Anxiety Level: Anxious Cooperation: Cooperative Education Importance: Acknowledges Need Interest in Health Problems: Asks Questions Perception: Coherent Willingness to Engage in Self-Management High Activities: Readiness to Engage in Self-Management High Activities: Electronic Signature(s) Signed: 05/30/2021 4:32:50 PM By: Carlene Coria RN Entered By: Carlene Coria on 05/29/2021 08:33:03 Jaime [name] ([id number]) -------------------------------------------------------------------------------- Fall Risk Assessment Details Patient Name: Jaime [name] Date of Service: 05/29/2021 8:00 AM Medical Record Number: [id number] Patient Account Number: [id number] Date of Birth/Sex: 10-23-1986 (35 y.o. F) Treating RN: Carlene Coria Primary Care Aviyon Hocevar: Stoney Bang Other Clinician: Referring Rudell Ortman: Merrily Pew Treating Evanne Matsunaga/Extender: Skipper Cliche in Treatment: 0 Fall Risk Assessment Items Have you had 2 or more falls in the last 12 monthso 0 No Have you had any fall that resulted in injury in the last 12 monthso 0 No FALLS RISK SCREEN History of falling - immediate or within 3 months 0 No Secondary diagnosis (Do you have 2 or more medical diagnoseso) 0 No Ambulatory aid None/bed rest/wheelchair/nurse 0 No Crutches/cane/walker 0 No Furniture 0 No Intravenous therapy Access/Saline/Heparin Lock 0 No Gait/Transferring Normal/ bed rest/ wheelchair 0 No Weak (short steps  with or without shuffle, stooped but able to lift head while walking, may 0 No seek support from furniture) Impaired (short steps with shuffle, may have difficulty arising from chair, head down, impaired 0 No balance) Mental Status Oriented to own ability 0 No Electronic Signature(s) Signed: 05/30/2021 4:32:50 PM By: Carlene Coria RN Entered By: Carlene Coria on 05/29/2021 08:33:10 Jaime [name] ([id number]) -------------------------------------------------------------------------------- Foot Assessment Details Patient Name: Jaime [name] Date of Service: 05/29/2021 8:00 AM Medical Record Number: [id number] Patient Account Number: [id number] Date of Birth/Sex: 18-Nov-1985 (35 y.o. F) Treating RN: Carlene Coria Primary Care Laquinta Hazell: Stoney Bang Other Clinician: Referring Delane Stalling: Merrily Pew Treating Verlia Kaney/Extender: Skipper Cliche in Treatment: 0 Foot Assessment Items Site Locations + = Sensation present, - = Sensation absent, C = Callus, U = Ulcer R = Redness, W = Warmth, M = Maceration, PU = Pre-ulcerative lesion F = Fissure, S = Swelling, D = Dryness Assessment Right: Left: Other Deformity: No No Prior Foot Ulcer: No No Prior Amputation: No No Charcot Joint: No No Ambulatory Status: Ambulatory  Without Help Gait: Steady Electronic Signature(s) Signed: 05/30/2021 4:32:50 PM By: Carlene Coria RN Entered By: Carlene Coria on 05/29/2021 08:33:56 Jaime [name] ([id number]) -------------------------------------------------------------------------------- Nutrition Risk Screening Details Patient Name: Jaime [name] Date of Service: 05/29/2021 8:00 AM Medical Record Number: [id number] Patient Account Number: [id number] Date of Birth/Sex: Jan 07, 1986 (35 y.o. F) Treating RN: Carlene Coria Primary Care Vianney Kopecky: Stoney Bang Other Clinician: Referring Stevin Bielinski: Merrily Pew Treating Jenasis Straley/Extender: Skipper Cliche in Treatment: 0 Height (in): 70 Weight (lbs): 301 Body Mass Index (BMI): 43.2 Nutrition Risk Screening Items Score Screening NUTRITION RISK SCREEN: I have an illness or condition that made me change the kind and/or amount of food I eat 0 No I eat fewer than two meals per day 0 No I eat few fruits and vegetables, or milk products 0 No I have three or more drinks of beer, liquor or wine almost every day 0 No I have tooth or mouth problems that make it hard for me to eat 0 No I don't always have enough money to buy the food I need 0 No I eat alone most of the time 0 No I take three or more different prescribed or over-the-counter drugs a day 1 Yes Without wanting to, I have lost or gained 10 pounds in the last six months 0 No I am not always physically able to shop, cook and/or feed myself 0 No Nutrition Protocols Good Risk Protocol 0 No interventions needed Moderate Risk Protocol High Risk Proctocol Risk Level: Good Risk Score: 1 Electronic Signature(s) Signed: 05/30/2021 4:32:50 PM By: Carlene Coria RN Entered By: Carlene Coria on 05/29/2021 08:33:35

## 2021-05-31 NOTE — Progress Notes (Signed)
BASIL, BLAKESLEY ([id number]) Visit Report for 05/29/2021 Chief Complaint Document Details Patient Name: Jaime [name], Jaime [name] Date of Service: 05/29/2021 8:00 AM Medical Record Number: [id number] Patient Account Number: [id number] Date of Birth/Sex: Mar 04, 1986 (35 y.o. F) Treating RN: Carlene Coria Primary Care Provider: Stoney Bang Other Clinician: Referring Provider: Merrily Pew Treating Provider/Extender: Skipper Cliche in Treatment: 0 Information Obtained from: Patient Chief Complaint Gluteal and sacral pressure ulcers Electronic Signature(s) Signed: 05/29/2021 9:03:40 AM By: Worthy Keeler PA-C Entered By: Worthy Keeler on 05/29/2021 09:03:40 Jaime [name] ([id number]) -------------------------------------------------------------------------------- HPI Details Patient Name: Jaime [name] Date of Service: 05/29/2021 8:00 AM Medical Record Number: [id number] Patient Account Number: [id number] Date of Birth/Sex: [birth year] (35 y.o. F) Treating RN: Carlene Coria Primary Care Provider: Stoney Bang Other Clinician: Referring Provider: Merrily Pew Treating Provider/Extender: Skipper Cliche in Treatment: 0 History of Present Illness HPI Description: 05/29/2021 upon evaluation today patient presents for initial evaluation here in our clinic concerning issues she has been having with what appears to be pressure ulcerations on the gluteal and sacral region. She tells me that she does tend to sit a lot. With that being said it does appear that these are pressure ulcers to be honest. I do not see any evidence of infection but nonetheless I do believe that the patient is having some discomfort here. She has been placed on doxycycline she is also previously used Allevyn dressings but no specific wound care dressings have been utilized up to this point. These have been present for about a month she tells me. She does have diabetes with hemoglobin A1c of 10.3 she did see her  endocrinologist yesterday she is try to work on that they got onto her she tells me. 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 She is on methadone looks like breakthrough oxycodone at this time. Nonetheless I believe that in general she seems to be doing pretty well but we do need to try and see what we can do to get these wounds healed for her as they are quite painful. Electronic Signature(s) Signed: 05/29/2021 9:27:07 AM By: Worthy Keeler PA-C Entered By: Worthy Keeler on 05/29/2021 09:27:07 Jaime [name] ([id number]) -------------------------------------------------------------------------------- Physical Exam Details Patient Name: Jaime [name] Date of Service: 05/29/2021 8:00 AM Medical Record Number: [id number] Patient Account Number: [id number] Date of Birth/Sex: 1986/05/27 (35 y.o. F) Treating RN: Carlene Coria Primary Care Provider: Stoney Bang Other Clinician: Referring Provider: Merrily Pew Treating Provider/Extender: Skipper Cliche in Treatment: 0 Constitutional sitting or standing blood pressure is within target range for patient.. pulse regular and within target range for patient.Marland Kitchen respirations regular, non- labored and within target range for patient.Marland Kitchen temperature within target range for patient.. Well-nourished and well-hydrated in no acute distress. Eyes conjunctiva clear no eyelid edema noted. pupils equal round and reactive to light and accommodation. Ears, Nose, Mouth, and Throat no gross abnormality of ear auricles or external auditory canals. normal hearing noted during conversation. mucus membranes moist. Respiratory normal breathing without difficulty. Cardiovascular 2+ dorsalis pedis/posterior tibialis pulses. no clubbing, cyanosis, significant edema, <3 sec cap refill. Musculoskeletal normal gait and posture. no significant deformity or arthritic changes, no  loss or range of motion, no clubbing. Psychiatric this patient is able to make decisions and demonstrates good insight into disease process. Alert and Oriented x 3. pleasant and cooperative. Notes Upon inspection patient's wound bed  actually showed signs of decent granulation at all 3 wound locations. With that being said I do believe that the patient is going to require close monitoring of the situation to ensure that nothing worsens from the standpoint of infection though right now I do not see any obvious signs of infection. I also discussed with her that she really try to keep pressure off as much as possible in order to help with healing. I think that is can be one of the biggest challenges here for her but I think it something she can definitely do she just needs to be aware that this is important. Electronic Signature(s) Signed: 05/29/2021 9:28:05 AM By: Worthy Keeler PA-C Entered By: Worthy Keeler on 05/29/2021 09:28:05 Jaime [name] ([id number]) -------------------------------------------------------------------------------- Physician Orders Details Patient Name: Jaime [name] Date of Service: 05/29/2021 8:00 AM Medical Record Number: [id number] Patient Account Number: [id number] Date of Birth/Sex: Oct 31, 1986 (35 y.o. F) Treating RN: Carlene Coria Primary Care Provider: Stoney Bang Other Clinician: Referring Provider: Merrily Pew Treating Provider/Extender: Skipper Cliche in Treatment: 0 Verbal / Phone Orders: No Diagnosis Coding ICD-10 Coding Code Description [phone] Pressure ulcer of left buttock, stage 3 L89.152 Pressure ulcer of sacral region, stage 2 E11.622 Type 2 diabetes mellitus with other skin ulcer J44.9 Chronic obstructive pulmonary disease, unspecified L93.0 Discoid lupus erythematosus Follow-up Appointments o Return Appointment in 1 week. Bathing/ Shower/ Hygiene o Wash wounds with antibacterial soap and water. o May shower; gently  cleanse wound with antibacterial soap, rinse and pat dry prior to dressing wounds Wound Treatment Wound #1 - Coccyx Cleanser: Soap and Water 3 x Per Week/30 Days Discharge Instructions: Gently cleanse wound with antibacterial soap, rinse and pat dry prior to dressing wounds Primary Dressing: Prisma 4.34 (in) 3 x Per Week/30 Days Discharge Instructions: Moisten w/normal saline or sterile water; Cover wound as directed. Do not remove from wound bed. Secondary Dressing: Coverlet Latex-Free Fabric Adhesive Dressings 3 x Per Week/30 Days Discharge Instructions: 1.5 x 2 Wound #2 - Gluteus Wound Laterality: Left, Proximal Cleanser: Soap and Water 3 x Per Week/30 Days Discharge Instructions: Gently cleanse wound with antibacterial soap, rinse and pat dry prior to dressing wounds Primary Dressing: Prisma 4.34 (in) 3 x Per Week/30 Days Discharge Instructions: Moisten w/normal saline or sterile water; Cover wound as directed. Do not remove from wound bed. Secondary Dressing: Coverlet Latex-Free Fabric Adhesive Dressings 3 x Per Week/30 Days Discharge Instructions: 1.5 x 2 Wound #3 - Gluteus Wound Laterality: Left, Distal Cleanser: Soap and Water 3 x Per Week/30 Days Discharge Instructions: Gently cleanse wound with antibacterial soap, rinse and pat dry prior to dressing wounds Primary Dressing: Prisma 4.34 (in) 3 x Per Week/30 Days Discharge Instructions: Moisten w/normal saline or sterile water; Cover wound as directed. Do not remove from wound bed. Secondary Dressing: Coverlet Latex-Free Fabric Adhesive Dressings 3 x Per Week/30 Days Discharge Instructions: 1.5 x 2 Electronic Signature(s) Signed: 05/29/2021 4:44:10 PM By: Worthy Keeler PA-C Signed: 05/30/2021 4:32:50 PM By: Carlene Coria RN Dorothyann Peng, Shelsea D. ([id number]) Entered By: Carlene Coria on 05/29/2021 09:12:25 Jaime [name] ([id number]) -------------------------------------------------------------------------------- Problem List  Details Patient Name: Jaime [name] Date of Service: 05/29/2021 8:00 AM Medical Record Number: [id number] Patient Account Number: [id number] Date of Birth/Sex: 1986-04-27 (35 y.o. F) Treating RN: Carlene Coria Primary Care Provider: Stoney Bang Other Clinician: Referring Provider: Merrily Pew Treating Provider/Extender: Skipper Cliche in Treatment: 0 Active Problems ICD-10 Encounter Code Description Active Date MDM Diagnosis L89.323 Pressure ulcer  of left buttock, stage 3 05/29/2021 No Yes L89.153 Pressure ulcer of sacral region, stage 3 05/29/2021 No Yes E11.622 Type 2 diabetes mellitus with other skin ulcer 05/29/2021 No Yes J44.9 Chronic obstructive pulmonary disease, unspecified 05/29/2021 No Yes L93.0 Discoid lupus erythematosus 05/29/2021 No Yes Inactive Problems Resolved Problems Electronic Signature(s) Signed: 05/29/2021 9:25:40 AM By: Worthy Keeler PA-C Previous Signature: 05/29/2021 9:03:24 AM Version By: Worthy Keeler PA-C Entered By: Worthy Keeler on 05/29/2021 09:25:39 Jaime [name] ([id number]) -------------------------------------------------------------------------------- Progress Note Details Patient Name: Jaime [name] Date of Service: 05/29/2021 8:00 AM Medical Record Number: [id number] Patient Account Number: [id number] Date of Birth/Sex: 1986-10-22 (35 y.o. F) Treating RN: Carlene Coria Primary Care Provider: Stoney Bang Other Clinician: Referring Provider: Merrily Pew Treating Provider/Extender: Skipper Cliche in Treatment: 0 Subjective Chief Complaint Information obtained from Patient Gluteal and sacral pressure ulcers History of Present Illness (HPI) 05/29/2021 upon evaluation today patient presents for initial evaluation here in our clinic concerning issues she has been having with what appears to be pressure ulcerations on the gluteal and sacral region. She tells me that she does tend to sit a lot. With that being said it does  appear that these are pressure ulcers to be honest. I do not see any evidence of infection but nonetheless I do believe that the patient is having some discomfort here. She has been placed on doxycycline she is also previously used Allevyn dressings but no specific wound care dressings have been utilized up to this point. These have been present for about a month she tells me. She does have diabetes with hemoglobin A1c of 10.3 she did see her endocrinologist yesterday she is try to work on that they got onto her she tells me. 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 She is on methadone looks like breakthrough oxycodone at this time. Nonetheless I believe that in general she seems to be doing pretty well but we do need to try and see what we can do to get these wounds healed for her as they are quite painful. Patient History Information obtained from Patient. Allergies ketorolac, Tylenol, gabapentin, morphine, NSAIDS (Non-Steroidal Anti-Inflammatory Drug), Suboxone, vancomycin, iodine, Iodine and Iodide Containing Products Social History Current every day smoker, Marital Status - Single, Alcohol Use - Never, Drug Use - Prior History - heron, Caffeine Use - Daily. Medical History Respiratory Patient has history of Chronic Obstructive Pulmonary Disease (COPD) Endocrine Patient has history of Type II Diabetes Neurologic Patient has history of Seizure Disorder Patient is treated with Insulin, Oral Agents. Blood sugar is tested. Blood sugar results noted at the following times: Breakfast - 300, Lunch - 400, Dinner - 400, Bedtime - 300. Review of Systems (ROS) Eyes Denies complaints or symptoms of Dry Eyes, Vision Changes, Glasses / Contacts. Ear/Nose/Mouth/Throat Denies complaints or symptoms of Difficult clearing ears, Sinusitis. Hematologic/Lymphatic Denies complaints or symptoms of Bleeding / Clotting  Disorders, Human Immunodeficiency Virus. Genitourinary Denies complaints or symptoms of Kidney failure/ Dialysis, Incontinence/dribbling. Immunological Denies complaints or symptoms of Hives, Itching. Integumentary (Skin) Complains or has symptoms of Wounds. Denies complaints or symptoms of Bleeding or bruising tendency, Breakdown, Swelling. Musculoskeletal Denies complaints or symptoms of Muscle Pain, Muscle Weakness. Neurologic Denies complaints or symptoms of Numbness/parasthesias, Focal/Weakness. Psychiatric Denies complaints or symptoms of Anxiety, Claustrophobia. YALDA, HERD D. ([id number]) Objective Constitutional sitting or standing blood pressure is within target range  for patient.. pulse regular and within target range for patient.Marland Kitchen respirations regular, non- labored and within target range for patient.Marland Kitchen temperature within target range for patient.. Well-nourished and well-hydrated in no acute distress. Vitals Time Taken: 8:24 AM, Height: 70 in, Source: Stated, Weight: 301 lbs, Source: Stated, BMI: 43.2, Temperature: 98.7 F, Pulse: 111 bpm, Respiratory Rate: 18 breaths/min, Blood Pressure: 109/63 mmHg. Eyes conjunctiva clear no eyelid edema noted. pupils equal round and reactive to light and accommodation. Ears, Nose, Mouth, and Throat no gross abnormality of ear auricles or external auditory canals. normal hearing noted during conversation. mucus membranes moist. Respiratory normal breathing without difficulty. Cardiovascular 2+ dorsalis pedis/posterior tibialis pulses. no clubbing, cyanosis, significant edema, Musculoskeletal normal gait and posture. no significant deformity or arthritic changes, no loss or range of motion, no clubbing. Psychiatric this patient is able to make decisions and demonstrates good insight into disease process. Alert and Oriented x 3. pleasant and cooperative. General Notes: Upon inspection patient's wound bed actually showed signs of  decent granulation at all 3 wound locations. With that being said I do believe that the patient is going to require close monitoring of the situation to ensure that nothing worsens from the standpoint of infection though right now I do not see any obvious signs of infection. I also discussed with her that she really try to keep pressure off as much as possible in order to help with healing. I think that is can be one of the biggest challenges here for her but I think it something she can definitely do she just needs to be aware that this is important. Integumentary (Hair, Skin) Wound #1 status is Open. Original cause of wound was Pressure Injury. The date acquired was: 05/02/2021. The wound is located on the Coccyx. The wound measures 1.1cm length x 0.3cm width x 0.1cm depth; 0.259cm^2 area and 0.026cm^3 volume. There is Fat Layer (Subcutaneous Tissue) exposed. There is no tunneling or undermining noted. There is a medium amount of serosanguineous drainage noted. There is large (67- 100%) red, pink granulation within the wound bed. There is no necrotic tissue within the wound bed. Wound #2 status is Open. Original cause of wound was Gradually Appeared. The date acquired was: 05/02/2021. The wound is located on the Left,Proximal Gluteus. The wound measures 1cm length x 0.6cm width x 0.1cm depth; 0.471cm^2 area and 0.047cm^3 volume. There is Fat Layer (Subcutaneous Tissue) exposed. There is no tunneling or undermining noted. There is a medium amount of serosanguineous drainage noted. There is medium (34-66%) red, pink granulation within the wound bed. There is a medium (34-66%) amount of necrotic tissue within the wound bed including Adherent Slough. Wound #3 status is Open. Original cause of wound was Gradually Appeared. The date acquired was: 05/02/2021. The wound is located on the Left,Distal Gluteus. The wound measures 1.1cm length x 0.7cm width x 0.1cm depth; 0.605cm^2 area and 0.06cm^3 volume. There is  Fat Layer (Subcutaneous Tissue) exposed. There is no tunneling or undermining noted. There is a medium amount of serosanguineous drainage noted. There is medium (34-66%) red, pink granulation within the wound bed. There is a medium (34-66%) amount of necrotic tissue within the wound bed including Adherent Slough. Assessment Active Problems ICD-10 Pressure ulcer of left buttock, stage 3 Pressure ulcer of sacral region, stage 3 Type 2 diabetes mellitus with other skin ulcer Chronic obstructive pulmonary disease, unspecified Discoid lupus erythematosus Plan FILICIA, SCOGIN. ([id number]) Follow-up Appointments: Return Appointment in 1 week. Bathing/ Shower/ Hygiene: Wash wounds with antibacterial  soap and water. May shower; gently cleanse wound with antibacterial soap, rinse and pat dry prior to dressing wounds WOUND #1: - Coccyx Wound Laterality: Cleanser: Soap and Water 3 x Per Week/30 Days Discharge Instructions: Gently cleanse wound with antibacterial soap, rinse and pat dry prior to dressing wounds Primary Dressing: Prisma 4.34 (in) 3 x Per Week/30 Days Discharge Instructions: Moisten w/normal saline or sterile water; Cover wound as directed. Do not remove from wound bed. Secondary Dressing: Coverlet Latex-Free Fabric Adhesive Dressings 3 x Per Week/30 Days Discharge Instructions: 1.5 x 2 WOUND #2: - Gluteus Wound Laterality: Left, Proximal Cleanser: Soap and Water 3 x Per Week/30 Days Discharge Instructions: Gently cleanse wound with antibacterial soap, rinse and pat dry prior to dressing wounds Primary Dressing: Prisma 4.34 (in) 3 x Per Week/30 Days Discharge Instructions: Moisten w/normal saline or sterile water; Cover wound as directed. Do not remove from wound bed. Secondary Dressing: Coverlet Latex-Free Fabric Adhesive Dressings 3 x Per Week/30 Days Discharge Instructions: 1.5 x 2 WOUND #3: - Gluteus Wound Laterality: Left, Distal Cleanser: Soap and Water 3 x Per Week/30  Days Discharge Instructions: Gently cleanse wound with antibacterial soap, rinse and pat dry prior to dressing wounds Primary Dressing: Prisma 4.34 (in) 3 x Per Week/30 Days Discharge Instructions: Moisten w/normal saline or sterile water; Cover wound as directed. Do not remove from wound bed. Secondary Dressing: Coverlet Latex-Free Fabric Adhesive Dressings 3 x Per Week/30 Days Discharge Instructions: 1.5 x 2 1. Would recommend currently that we go ahead and initiate treatment with a silver collagen dressing I think that is the best way to go currently and the patient is in agreement with the plan. We will see how things do over the next week. We may make changes to the regimen going forward in the future if need be but a lot depends on how well she does between now and then. 2. I am also can recommend at this time that we have the patient continue with appropriate offloading and I again recommended to her that if she is on the couch she cannot lay to the side more taking pressure off of the gluteal and sacral region I think she is needs to be very careful and cautious with not causing any pain or problems here. 3. I did recommend next care bandages for her to try to be both waterproof as well as something that would not be too expensive but cost effective and not causing additional damage to the region. She is in agreement with that plan and is going to look for those at the pharmacy. Also gave her a print out where she can get them on [address] for a pack of 32 which would cost her $16. We will see patient back for reevaluation in 1 week here in the clinic. If anything worsens or changes patient will contact our office for additional recommendations. Electronic Signature(s) Signed: 05/29/2021 9:29:27 AM By: Worthy Keeler PA-C Entered By: Worthy Keeler on 05/29/2021 09:29:27 Jaime [name]  ([id number]) -------------------------------------------------------------------------------- ROS/PFSH Details Patient Name: Jaime [name] Date of Service: 05/29/2021 8:00 AM Medical Record Number: [id number] Patient Account Number: [id number] Date of Birth/Sex: June 17, 1986 (35 y.o. F) Treating RN: Carlene Coria Primary Care Provider: Stoney Bang Other Clinician: Referring Provider: Merrily Pew Treating Provider/Extender: Skipper Cliche in Treatment: 0 Information Obtained From Patient Eyes Complaints and Symptoms: Negative for: Dry Eyes; Vision Changes; Glasses / Contacts Ear/Nose/Mouth/Throat Complaints and Symptoms: Negative for: Difficult clearing ears; Sinusitis Hematologic/Lymphatic Complaints and  Symptoms: Negative for: Bleeding / Clotting Disorders; Human Immunodeficiency Virus Genitourinary Complaints and Symptoms: Negative for: Kidney failure/ Dialysis; Incontinence/dribbling Immunological Complaints and Symptoms: Negative for: Hives; Itching Integumentary (Skin) Complaints and Symptoms: Positive for: Wounds Negative for: Bleeding or bruising tendency; Breakdown; Swelling Musculoskeletal Complaints and Symptoms: Negative for: Muscle Pain; Muscle Weakness Neurologic Complaints and Symptoms: Negative for: Numbness/parasthesias; Focal/Weakness Medical History: Positive for: Seizure Disorder Psychiatric Complaints and Symptoms: Negative for: Anxiety; Claustrophobia Respiratory Medical History: Positive for: Chronic Obstructive Pulmonary Disease (COPD) Endocrine TYKEISHA, PEER. ([id number]) Medical History: Positive for: Type II Diabetes Time with diabetes: 7 years Treated with: Insulin, Oral agents Blood sugar tested every day: Yes Tested : 4 Blood sugar testing results: Breakfast: 300; Lunch: 400; Dinner: 400; Bedtime: 300 Oncologic Immunizations Pneumococcal Vaccine: Received Pneumococcal Vaccination: Yes Received Pneumococcal Vaccination On  or After 60th Birthday: Yes Implantable Devices None Family and Social History Current every day smoker; Marital Status - Single; Alcohol Use: Never; Drug Use: Prior History - heron; Caffeine Use: Daily; Financial Concerns: No; Food, Clothing or Shelter Needs: No; Support System Lacking: No; Transportation Concerns: No Electronic Signature(s) Signed: 05/29/2021 4:44:10 PM By: Worthy Keeler PA-C Signed: 05/30/2021 4:32:50 PM By: Carlene Coria RN Entered By: Carlene Coria on 05/29/2021 08:30:31 Jaime [name] ([id number]) -------------------------------------------------------------------------------- SuperBill Details Patient Name: Jaime [name] Date of Service: 05/29/2021 Medical Record Number: [id number] Patient Account Number: [id number] Date of Birth/Sex: May 21, 1986 (35 y.o. F) Treating RN: Carlene Coria Primary Care Provider: Stoney Bang Other Clinician: Referring Provider: Merrily Pew Treating Provider/Extender: Skipper Cliche in Treatment: 0 Diagnosis Coding ICD-10 Codes Code Description [phone] Pressure ulcer of left buttock, stage 3 L89.153 Pressure ulcer of sacral region, stage 3 E11.622 Type 2 diabetes mellitus with other skin ulcer J44.9 Chronic obstructive pulmonary disease, unspecified L93.0 Discoid lupus erythematosus Physician Procedures CPT4 Code: [id number] Description: 94765 - WC PHYS LEVEL 4 - NEW PT Modifier: Quantity: 1 CPT4 Code: Description: ICD-10 Diagnosis Description L89.323 Pressure ulcer of left buttock, stage 3 L89.153 Pressure ulcer of sacral region, stage 3 E11.622 Type 2 diabetes mellitus with other skin ulcer J44.9 Chronic obstructive pulmonary disease, unspecified Modifier: Quantity: Electronic Signature(s) Signed: 05/29/2021 9:29:53 AM By: Worthy Keeler PA-C Entered By: Worthy Keeler on 05/29/2021 09:29:53

## 2021-05-31 NOTE — Progress Notes (Signed)
RAGHAD, LORENZ ([id number]) Visit Report for 05/29/2021 Allergy List Details Patient Name: Jaime [name], Jaime [name] Date of Service: 05/29/2021 8:00 AM Medical Record Number: [id number] Patient Account Number: [id number] Date of Birth/Sex: 1986/01/21 (35 y.o. F) Treating RN: Carlene Coria Primary Care Ewa Hipp: Stoney Bang Other Clinician: Referring Jessamy Torosyan: Merrily Pew Treating Yenesis Even/Extender: Skipper Cliche in Treatment: 0 Allergies Active Allergies ketorolac Tylenol gabapentin morphine NSAIDS (Non-Steroidal Anti-Inflammatory Drug) Suboxone vancomycin iodine Iodine and Iodide Containing Products Allergy Notes Electronic Signature(s) Signed: 05/29/2021 8:51:11 AM By: Carlene Coria RN Entered By: Carlene Coria on 05/29/2021 08:51:11 Jaime [name] ([id number]) -------------------------------------------------------------------------------- Arrival Information Details Patient Name: Jaime [name] Date of Service: 05/29/2021 8:00 AM Medical Record Number: [id number] Patient Account Number: [id number] Date of Birth/Sex: April 08, 1986 (35 y.o. F) Treating RN: Carlene Coria Primary Care Reanna Scoggin: Stoney Bang Other Clinician: Referring Jeri Jeanbaptiste: Merrily Pew Treating Hayven Fatima/Extender: Skipper Cliche in Treatment: 0 Visit Information Patient Arrived: Ambulatory Arrival Time: 08:22 Accompanied By: self Transfer Assistance: None Patient Identification Verified: Yes Secondary Verification Process Completed: Yes Patient Requires Transmission-Based Precautions: No Patient Has Alerts: No Electronic Signature(s) Signed: 05/30/2021 4:32:50 PM By: Carlene Coria RN Entered By: Carlene Coria on 05/29/2021 08:22:21 Jaime [name] ([id number]) -------------------------------------------------------------------------------- Lower Extremity Assessment Details Patient Name: Jaime [name] Date of Service: 05/29/2021 8:00 AM Medical Record Number: [id number] Patient Account  Number: [id number] Date of Birth/Sex: [birth year] (35 y.o. F) Treating RN: Carlene Coria Primary Care Nashae Maudlin: Stoney Bang Other Clinician: Referring Denzell Colasanti: Merrily Pew Treating Trayvon Trumbull/Extender: Skipper Cliche in Treatment: 0 Electronic Signature(s) Signed: 05/30/2021 4:32:50 PM By: Carlene Coria RN Entered By: Carlene Coria on 05/29/2021 08:43:56 Jaime [name] ([id number]) -------------------------------------------------------------------------------- Multi Wound Chart Details Patient Name: Jaime [name] Date of Service: 05/29/2021 8:00 AM Medical Record Number: [id number] Patient Account Number: [id number] Date of Birth/Sex: 09-18-1986 (35 y.o. F) Treating RN: Carlene Coria Primary Care Karan Ramnauth: Stoney Bang Other Clinician: Referring Sylvester Salonga: Merrily Pew Treating Brookelin Felber/Extender: Skipper Cliche in Treatment: 0 Vital Signs Height(in): 70 Pulse(bpm): 111 Weight(lbs): 301 Blood Pressure(mmHg): 109/63 Body Mass Index(BMI): 43 Temperature(F): 98.7 Respiratory Rate(breaths/min): 18 Photos: Wound Location: Coccyx Left, Proximal Gluteus Left, Distal Gluteus Wounding Event: Pressure Injury Gradually Appeared Gradually Appeared Primary Etiology: Pressure Ulcer Pressure Ulcer Pressure Ulcer Comorbid History: Chronic Obstructive Pulmonary Chronic Obstructive Pulmonary Chronic Obstructive Pulmonary Disease (COPD), Type II Diabetes, Disease (COPD), Type II Diabetes, Disease (COPD), Type II Diabetes, Seizure Disorder Seizure Disorder Seizure Disorder Date Acquired: 05/02/2021 05/02/2021 05/02/2021 Weeks of Treatment: 0 0 0 Wound Status: Open Open Open Measurements L x W x D (cm) 1.1x0.3x0.1 1x0.6x0.1 1.1x0.7x0.1 Area (cm) : 0.259 0.471 0.605 Volume (cm) : 0.026 0.047 0.06 % Reduction in Area: 0.00% N/A N/A % Reduction in Volume: 0.00% N/A N/A Classification: Category/Stage III Category/Stage III Category/Stage III Exudate Amount: Medium Medium Medium Exudate Type:  Serosanguineous Serosanguineous Serosanguineous Exudate Color: red, brown red, brown red, brown Granulation Amount: Large (67-100%) Medium (34-66%) Medium (34-66%) Granulation Quality: Red, Pink Red, Pink Red, Pink Necrotic Amount: None Present (0%) Medium (34-66%) Medium (34-66%) Exposed Structures: Fat Layer (Subcutaneous Tissue): Fat Layer (Subcutaneous Tissue): Fat Layer (Subcutaneous Tissue): Yes Yes Yes Fascia: No Fascia: No Fascia: No Tendon: No Tendon: No Tendon: No Muscle: No Muscle: No Muscle: No Joint: No Joint: No Joint: No Bone: No Bone: No Bone: No Epithelialization: None None None Treatment Notes Electronic Signature(s) Signed: 05/30/2021 4:32:50 PM By: Carlene Coria RN Entered By: Carlene Coria on 05/29/2021 09:09:48 Jaime [name] ([id number]) -------------------------------------------------------------------------------- Multi-Disciplinary Care Plan Details Patient Name:  Jaime Blake D. Date of Service: 05/29/2021 8:00 AM Medical Record Number: [id number] Patient Account Number: [id number] Date of Birth/Sex: February 22, 1986 (35 y.o. F) Treating RN: Carlene Coria Primary Care Glady Ouderkirk: Stoney Bang Other Clinician: Referring Islay Polanco: Merrily Pew Treating Berkeley Vanaken/Extender: Skipper Cliche in Treatment: 0 Active Inactive Wound/Skin Impairment Nursing Diagnoses: Knowledge deficit related to ulceration/compromised skin integrity Goals: Patient/caregiver will verbalize understanding of skin care regimen Date Initiated: 05/29/2021 Target Resolution Date: 06/29/2021 Goal Status: Active Ulcer/skin breakdown will have a volume reduction of 30% by week 4 Date Initiated: 05/29/2021 Target Resolution Date: 06/29/2021 Goal Status: Active Ulcer/skin breakdown will have a volume reduction of 50% by week 8 Date Initiated: 05/29/2021 Target Resolution Date: 07/30/2021 Goal Status: Active Ulcer/skin breakdown will have a volume reduction of 80% by week 12 Date  Initiated: 05/29/2021 Target Resolution Date: 08/29/2021 Goal Status: Active Ulcer/skin breakdown will heal within 14 weeks Date Initiated: 05/29/2021 Target Resolution Date: 09/29/2021 Goal Status: Active Interventions: Assess patient/caregiver ability to obtain necessary supplies Assess patient/caregiver ability to perform ulcer/skin care regimen upon admission and as needed Assess ulceration(s) every visit Notes: Electronic Signature(s) Signed: 05/30/2021 4:32:50 PM By: Carlene Coria RN Entered By: Carlene Coria on 05/29/2021 09:05:50 Jaime [name] ([id number]) -------------------------------------------------------------------------------- Pain Assessment Details Patient Name: Jaime [name] Date of Service: 05/29/2021 8:00 AM Medical Record Number: [id number] Patient Account Number: [id number] Date of Birth/Sex: 17-Dec-1985 (35 y.o. F) Treating RN: Carlene Coria Primary Care Wen Merced: Stoney Bang Other Clinician: Referring Star Resler: Merrily Pew Treating Suleima Ohlendorf/Extender: Skipper Cliche in Treatment: 0 Active Problems Location of Pain Severity and Description of Pain Patient Has Paino Yes Site Locations With Dressing Change: Yes Duration of the Pain. Constant / Intermittento Constant Rate the pain. Current Pain Level: 8 Worst Pain Level: 10 Least Pain Level: 8 Tolerable Pain Level: 8 Character of Pain Describe the Pain: Burning, Tender Pain Management and Medication Current Pain Management: Medication: Yes Cold Application: No Rest: Yes Massage: No Activity: No T.E.N.S.: No Heat Application: No Leg drop or elevation: No Is the Current Pain Management Adequate: Inadequate How does your wound impact your activities of daily livingo Sleep: Yes Bathing: No Appetite: No Relationship With Others: No Bladder Continence: No Emotions: No Bowel Continence: No Work: No Toileting: No Drive: No Dressing: No Hobbies: No Electronic Signature(s) Signed:  05/30/2021 4:32:50 PM By: Carlene Coria RN Entered By: Carlene Coria on 05/29/2021 08:24:10 Jaime [name] ([id number]) -------------------------------------------------------------------------------- Wound Assessment Details Patient Name: Jaime [name] Date of Service: 05/29/2021 8:00 AM Medical Record Number: [id number] Patient Account Number: [id number] Date of Birth/Sex: 09-01-1986 (35 y.o. F) Treating RN: Carlene Coria Primary Care Shonteria Abeln: Stoney Bang Other Clinician: Referring Langston Summerfield: Merrily Pew Treating Vasilia Dise/Extender: Skipper Cliche in Treatment: 0 Wound Status Wound Number: 1 Primary Pressure Ulcer Etiology: Wound Location: Coccyx Wound Status: Open Wounding Event: Pressure Injury Comorbid Chronic Obstructive Pulmonary Disease (COPD), Type II Date Acquired: 05/02/2021 History: Diabetes, Seizure Disorder Weeks Of Treatment: 0 Clustered Wound: No Photos Wound Measurements Length: (cm) 1.1 Width: (cm) 0.3 Depth: (cm) 0.1 Area: (cm) 0.259 Volume: (cm) 0.026 % Reduction in Area: 0% % Reduction in Volume: 0% Epithelialization: None Tunneling: No Undermining: No Wound Description Classification: Category/Stage III Exudate Amount: Medium Exudate Type: Serosanguineous Exudate Color: red, brown Foul Odor After Cleansing: No Slough/Fibrino No Wound Bed Granulation Amount: Large (67-100%) Exposed Structure Granulation Quality: Red, Pink Fascia Exposed: No Necrotic Amount: None Present (0%) Fat Layer (Subcutaneous Tissue) Exposed: Yes Tendon Exposed: No Muscle Exposed: No Joint Exposed: No Bone Exposed:  No Electronic Signature(s) Signed: 05/30/2021 4:32:50 PM By: Carlene Coria RN Entered By: Carlene Coria on 05/29/2021 09:06:43 Jaime [name] ([id number]) -------------------------------------------------------------------------------- Wound Assessment Details Patient Name: Jaime [name] Date of Service: 05/29/2021 8:00 AM Medical Record  Number: [id number] Patient Account Number: [id number] Date of Birth/Sex: [birth year] (35 y.o. F) Treating RN: Carlene Coria Primary Care Majesty Stehlin: Stoney Bang Other Clinician: Referring Brion Sossamon: Merrily Pew Treating Albertia Carvin/Extender: Skipper Cliche in Treatment: 0 Wound Status Wound Number: 2 Primary Pressure Ulcer Etiology: Wound Location: Left, Proximal Gluteus Wound Status: Open Wounding Event: Gradually Appeared Comorbid Chronic Obstructive Pulmonary Disease (COPD), Type II Date Acquired: 05/02/2021 History: Diabetes, Seizure Disorder Weeks Of Treatment: 0 Clustered Wound: No Photos Wound Measurements Length: (cm) 1 Width: (cm) 0.6 Depth: (cm) 0.1 Area: (cm) 0.471 Volume: (cm) 0.047 % Reduction in Area: % Reduction in Volume: Epithelialization: None Tunneling: No Undermining: No Wound Description Classification: Category/Stage III Exudate Amount: Medium Exudate Type: Serosanguineous Exudate Color: red, brown Foul Odor After Cleansing: No Slough/Fibrino Yes Wound Bed Granulation Amount: Medium (34-66%) Exposed Structure Granulation Quality: Red, Pink Fascia Exposed: No Necrotic Amount: Medium (34-66%) Fat Layer (Subcutaneous Tissue) Exposed: Yes Necrotic Quality: Adherent Slough Tendon Exposed: No Muscle Exposed: No Joint Exposed: No Bone Exposed: No Electronic Signature(s) Signed: 05/30/2021 4:32:50 PM By: Carlene Coria RN Entered By: Carlene Coria on 05/29/2021 08:41:56 Jaime [name] ([id number]) -------------------------------------------------------------------------------- Wound Assessment Details Patient Name: Jaime [name] Date of Service: 05/29/2021 8:00 AM Medical Record Number: [id number] Patient Account Number: [id number] Date of Birth/Sex: 1986-06-30 (35 y.o. F) Treating RN: Carlene Coria Primary Care Myeesha Shane: Stoney Bang Other Clinician: Referring River Mckercher: Merrily Pew Treating Brendin Situ/Extender: Skipper Cliche in Treatment:  0 Wound Status Wound Number: 3 Primary Pressure Ulcer Etiology: Wound Location: Left, Distal Gluteus Wound Status: Open Wounding Event: Gradually Appeared Comorbid Chronic Obstructive Pulmonary Disease (COPD), Type II Date Acquired: 05/02/2021 History: Diabetes, Seizure Disorder Weeks Of Treatment: 0 Clustered Wound: No Photos Wound Measurements Length: (cm) 1.1 Width: (cm) 0.7 Depth: (cm) 0.1 Area: (cm) 0.605 Volume: (cm) 0.06 % Reduction in Area: % Reduction in Volume: Epithelialization: None Tunneling: No Undermining: No Wound Description Classification: Category/Stage III Exudate Amount: Medium Exudate Type: Serosanguineous Exudate Color: red, brown Foul Odor After Cleansing: No Slough/Fibrino Yes Wound Bed Granulation Amount: Medium (34-66%) Exposed Structure Granulation Quality: Red, Pink Fascia Exposed: No Necrotic Amount: Medium (34-66%) Fat Layer (Subcutaneous Tissue) Exposed: Yes Necrotic Quality: Adherent Slough Tendon Exposed: No Muscle Exposed: No Joint Exposed: No Bone Exposed: No Electronic Signature(s) Signed: 05/30/2021 4:32:50 PM By: Carlene Coria RN Entered By: Carlene Coria on 05/29/2021 08:43:43 Jaime [name] ([id number]) -------------------------------------------------------------------------------- Vitals Details Patient Name: Jaime [name] Date of Service: 05/29/2021 8:00 AM Medical Record Number: [id number] Patient Account Number: [id number] Date of Birth/Sex: 1986/08/06 (35 y.o. F) Treating RN: Carlene Coria Primary Care Namine Beahm: Stoney Bang Other Clinician: Referring Hill Mackie: Merrily Pew Treating Sameria Morss/Extender: Skipper Cliche in Treatment: 0 Vital Signs Time Taken: 08:24 Temperature (F): 98.7 Height (in): 70 Pulse (bpm): 111 Source: Stated Respiratory Rate (breaths/min): 18 Weight (lbs): 301 Blood Pressure (mmHg): 109/63 Source: Stated Reference Range: 80 - 120 mg / dl Body Mass Index (BMI):  43.2 Electronic Signature(s) Signed: 05/30/2021 4:32:50 PM By: Carlene Coria RN Entered By: Carlene Coria on 05/29/2021 08:25:01

## 2021-06-06 ENCOUNTER — Encounter: Payer: Medicaid Other | Attending: Physician Assistant | Admitting: Physician Assistant

## 2021-06-06 ENCOUNTER — Other Ambulatory Visit: Payer: Self-pay

## 2021-06-06 DIAGNOSIS — L89323 Pressure ulcer of left buttock, stage 3: Secondary | ICD-10-CM | POA: Diagnosis not present

## 2021-06-06 DIAGNOSIS — J449 Chronic obstructive pulmonary disease, unspecified: Secondary | ICD-10-CM | POA: Diagnosis not present

## 2021-06-06 DIAGNOSIS — L930 Discoid lupus erythematosus: Secondary | ICD-10-CM | POA: Insufficient documentation

## 2021-06-06 DIAGNOSIS — L97812 Non-pressure chronic ulcer of other part of right lower leg with fat layer exposed: Secondary | ICD-10-CM | POA: Diagnosis not present

## 2021-06-06 DIAGNOSIS — L89153 Pressure ulcer of sacral region, stage 3: Secondary | ICD-10-CM | POA: Insufficient documentation

## 2021-06-06 DIAGNOSIS — E11622 Type 2 diabetes mellitus with other skin ulcer: Secondary | ICD-10-CM | POA: Diagnosis not present

## 2021-06-06 NOTE — Progress Notes (Signed)
KANYLA, OMEARA ([id number]) Visit Report for 06/06/2021 Arrival Information Details Patient Name: Jaime [name], Jaime [name] Date of Service: 06/06/2021 1:15 PM Medical Record Number: [id number] Patient Account Number: [id number] Date of Birth/Sex: Apr 11, 1986 (35 y.o. F) Treating RN: Donnamarie Poag Primary Care Keston Seever: Stoney Bang Other Clinician: Referring Hayley Horn: Stoney Bang Treating Dimetri Armitage/Extender: Skipper Cliche in Treatment: 1 Visit Information History Since Last Visit Added or deleted any medications: No Patient Arrived: Ambulatory Had a fall or experienced change in No Arrival Time: 13:18 activities of daily living that may affect Accompanied By: self risk of falls: Transfer Assistance: None Hospitalized since last visit: No Patient Identification Verified: Yes Has Dressing in Place as Prescribed: Yes Secondary Verification Process Completed: Yes Pain Present Now: Yes Patient Requires Transmission-Based Precautions: No Patient Has Alerts: No Electronic Signature(s) Signed: 06/06/2021 4:15:24 PM By: Donnamarie Poag Entered By: Donnamarie Poag on 06/06/2021 13:20:35 Jaime [name] ([id number]) -------------------------------------------------------------------------------- Clinic Level of Care Assessment Details Patient Name: Jaime [name] Date of Service: 06/06/2021 1:15 PM Medical Record Number: [id number] Patient Account Number: [id number] Date of Birth/Sex: [birth year] (35 y.o. F) Treating RN: Donnamarie Poag Primary Care Lajarvis Italiano: Stoney Bang Other Clinician: Referring Treshun Wold: Stoney Bang Treating Rosabelle Jupin/Extender: Skipper Cliche in Treatment: 1 Clinic Level of Care Assessment Items TOOL 1 Quantity Score _0  - Use when EandM and Procedure is performed on INITIAL visit 0 ASSESSMENTS - Nursing Assessment / Reassessment _1  - General Physical Exam (combine w/ comprehensive assessment (listed just below) when performed on new 0 pt. evals) _2  - 0 Comprehensive  Assessment (HX, ROS, Risk Assessments, Wounds Hx, etc.) ASSESSMENTS - Wound and Skin Assessment / Reassessment _3  - Dermatologic / Skin Assessment (not related to wound area) 0 ASSESSMENTS - Ostomy and/or Continence Assessment and Care _4  - Incontinence Assessment and Management 0 _5  - 0 Ostomy Care Assessment and Management (repouching, etc.) PROCESS - Coordination of Care _6  - Simple Patient / Family Education for ongoing care 0 _7  - 0 Complex (extensive) Patient / Family Education for ongoing care _8  - 0 Staff obtains Programmer, systems, Records, Test Results / Process Orders _9  - 0 Staff telephones HHA, Nursing Homes / Clarify orders / etc _10  - 0 Routine Transfer to another Facility (non-emergent condition) _11  - 0 Routine Hospital Admission (non-emergent condition) _12  - 0 New Admissions / Biomedical engineer / Ordering NPWT, Apligraf, etc. _13  - 0 Emergency Hospital Admission (emergent condition) PROCESS - Special Needs _14  - Pediatric / Minor Patient Management 0 _15  - 0 Isolation Patient Management _16  - 0 Hearing / Language / Visual special needs _17  - 0 Assessment of Community assistance (transportation, D/C planning, etc.) _18  - 0 Additional assistance / Altered mentation _19  - 0 Support Surface(s) Assessment (bed, cushion, seat, etc.) INTERVENTIONS - Miscellaneous _20  - External ear exam 0 _21  - 0 Patient Transfer (multiple staff / Civil Service fast streamer / Similar devices) _22  - 0 Simple Staple / Suture removal (25 or less) _23  - 0 Complex Staple / Suture removal (26 or more) _24  - 0 Hypo/Hyperglycemic Management (do not check if billed separately) _25  - 0 Ankle / Brachial Index (ABI) - do not check if billed separately Has the patient been seen at the hospital within the last three years: Yes Total Score: 0 Level Of Care: ____ Jaime [name] ([id number]) Electronic Signature(s) Signed: 06/06/2021 4:15:24 PM By: Donnamarie Poag Entered By: Donnamarie Poag on 06/06/2021 14:47:13 Jaime [name] ([id number]) -------------------------------------------------------------------------------- Encounter Discharge Information Details Patient Name: Jaime [name] Date of Service: 06/06/2021 1:15 PM Medical  Record Number: [id number] Patient Account Number: [id number] Date of Birth/Sex: 09-30-1986 (35 y.o. F) Treating RN: Donnamarie Poag Primary Care Angeletta Goelz: Stoney Bang Other Clinician: Referring Colinda Barth: Stoney Bang Treating Maze Corniel/Extender: Skipper Cliche in Treatment: 1 Encounter Discharge Information Items Post Procedure Vitals Discharge Condition: Stable Temperature (F): 97.7 Ambulatory Status: Ambulatory Pulse (bpm): 99 Discharge Destination: Home Respiratory Rate (breaths/min): 18 Transportation: Private Auto Blood Pressure (mmHg): 123/79 Accompanied By: self Schedule Follow-up Appointment: Yes Clinical Summary of Care: Electronic Signature(s) Signed: 06/06/2021 4:15:24 PM By: Donnamarie Poag Entered By: Donnamarie Poag on 06/06/2021 14:51:04 Jaime [name] ([id number]) -------------------------------------------------------------------------------- Lower Extremity Assessment Details Patient Name: Jaime [name] Date of Service: 06/06/2021 1:15 PM Medical Record Number: [id number] Patient Account Number: [id number] Date of Birth/Sex: 08-13-1986 (35 y.o. F) Treating RN: Donnamarie Poag Primary Care Nyhla Mountjoy: Stoney Bang Other Clinician: Referring Cyera Balboni: Stoney Bang Treating Rober Skeels/Extender: Skipper Cliche in Treatment: 1 Edema Assessment Assessed: [Left: Yes] [Right: Yes] Edema: [Left: No] [Right: No] Vascular Assessment Pulses: Dorsalis Pedis Palpable: [Right:Yes] Electronic Signature(s) Signed: 06/06/2021 4:15:24 PM By: Donnamarie Poag Entered By: Donnamarie Poag on 06/06/2021 13:33:19 Jaime [name] ([id number]) -------------------------------------------------------------------------------- Multi Wound Chart Details Patient Name: Jaime [name] Date of Service: 06/06/2021 1:15 PM Medical Record Number: [id number] Patient Account Number: [id number] Date of Birth/Sex: 1986/04/02 (35 y.o. F) Treating RN: Donnamarie Poag Primary Care Sylena Lotter: Stoney Bang Other Clinician: Referring Gari Trovato: Stoney Bang Treating Nicolette Gieske/Extender: Skipper Cliche in Treatment: 1 Vital Signs Height(in): 70 Pulse(bpm): 99 Weight(lbs): 301 Blood Pressure(mmHg): 123/79 Body Mass Index(BMI): 43 Temperature(F): 97.7 Respiratory Rate(breaths/min): 18 Photos: Wound Location: Coccyx Left, Proximal Gluteus Left, Distal Gluteus Wounding Event: Pressure Injury Gradually Appeared Gradually Appeared Primary Etiology: Pressure Ulcer Pressure Ulcer Pressure Ulcer Comorbid History: Chronic Obstructive Pulmonary Chronic Obstructive Pulmonary Chronic Obstructive Pulmonary Disease (COPD), Type II Diabetes, Disease (COPD), Type II Diabetes, Disease (COPD), Type II Diabetes, Seizure Disorder Seizure Disorder Seizure Disorder Date Acquired: 05/02/2021 05/02/2021 05/02/2021 Weeks of Treatment: _0 Wound Status: Open Open Open Measurements L x W x D (cm) 0.6x0.3x0.1 0.5x0.7x0.1 0.4x0.4x0.1 Area (cm) : 0.141 0.275 0.126 Volume (cm) : 0.014 0.027 0.013 % Reduction in Area: 45.60% 41.60% 79.20% % Reduction in Volume: 46.20% 42.60% 78.30% Classification: Category/Stage III Category/Stage III Category/Stage III Exudate Amount: Medium Medium Medium Exudate Type: Serosanguineous Serosanguineous Serosanguineous Exudate Color: red, brown red, brown red, brown Granulation Amount: Medium (34-66%) Medium (34-66%) Medium (34-66%) Granulation Quality: Red, Pink Red, Pink Red, Pink Necrotic Amount: Medium (34-66%) Medium (34-66%) Medium (34-66%) Necrotic Tissue: Adherent [place] Exposed Structures: Fat Layer (Subcutaneous Tissue): Fat Layer (Subcutaneous Tissue): Fat Layer (Subcutaneous Tissue): Yes Yes Yes Fascia: No Fascia: No Fascia:  No Tendon: No Tendon: No Tendon: No Muscle: No Muscle: No Muscle: No Joint: No Joint: No Joint: No Bone: No Bone: No Bone: No Epithelialization: None None None Wound Number: 4 N/A N/A Photos: N/A N/A SANJNA, HASKEW ([id number]) Wound Location: Right Knee N/A N/A Wounding Event: Skin Tear/Laceration N/A N/A Primary Etiology: Trauma, Other N/A N/A Comorbid History: Chronic Obstructive Pulmonary N/A N/A Disease (COPD), Type II Diabetes, Seizure Disorder Date Acquired: 05/23/2021 N/A N/A Weeks of Treatment: 0 N/A N/A Wound Status: Open N/A N/A Measurements L x W x D (cm) 2.5x1.5x0.1 N/A N/A Area (cm) : 2.945 N/A N/A Volume (cm) : 0.295 N/A N/A % Reduction in Area: N/A N/A N/A % Reduction in Volume: N/A N/A N/A Classification: Full Thickness Without Exposed N/A N/A Support Structures Exudate Amount: None Present N/A N/A Exudate  Type: N/A N/A N/A Exudate Color: N/A N/A N/A Granulation Amount: None Present (0%) N/A N/A Granulation Quality: N/A N/A N/A Necrotic Amount: Large (67-100%) N/A N/A Necrotic Tissue: Eschar N/A N/A Exposed Structures: N/A N/A N/A Epithelialization: N/A N/A N/A Treatment Notes Electronic Signature(s) Signed: 06/06/2021 4:15:24 PM By: Donnamarie Poag Entered By: Donnamarie Poag on 06/06/2021 14:38:40 Jaime [name] ([id number]) -------------------------------------------------------------------------------- [place] Details Patient Name: Jaime [name] Date of Service: 06/06/2021 1:15 PM Medical Record Number: [id number] Patient Account Number: [id number] Date of Birth/Sex: [birth year] (35 y.o. F) Treating RN: Donnamarie Poag Primary Care Jamayia Croker: Stoney Bang Other Clinician: Referring Hilding Quintanar: Stoney Bang Treating Levan Aloia/Extender: Skipper Cliche in Treatment: 1 Active Inactive Wound/Skin Impairment Nursing Diagnoses: Knowledge deficit related to ulceration/compromised skin integrity Goals: Patient/caregiver will  verbalize understanding of skin care regimen Date Initiated: 05/29/2021 Date Inactivated: 06/06/2021 Target Resolution Date: 06/29/2021 Goal Status: Met Ulcer/skin breakdown will have a volume reduction of 30% by week 4 Date Initiated: 05/29/2021 Target Resolution Date: 06/29/2021 Goal Status: Active Ulcer/skin breakdown will have a volume reduction of 50% by week 8 Date Initiated: 05/29/2021 Target Resolution Date: 07/30/2021 Goal Status: Active Ulcer/skin breakdown will have a volume reduction of 80% by week 12 Date Initiated: 05/29/2021 Target Resolution Date: 08/29/2021 Goal Status: Active Ulcer/skin breakdown will heal within 14 weeks Date Initiated: 05/29/2021 Target Resolution Date: 09/29/2021 Goal Status: Active Interventions: Assess patient/caregiver ability to obtain necessary supplies Assess patient/caregiver ability to perform ulcer/skin care regimen upon admission and as needed Assess ulceration(s) every visit Notes: Electronic Signature(s) Signed: 06/06/2021 4:15:24 PM By: Donnamarie Poag Entered By: Donnamarie Poag on 06/06/2021 13:33:32 Jaime [name] ([id number]) -------------------------------------------------------------------------------- Pain Assessment Details Patient Name: Jaime [name] Date of Service: 06/06/2021 1:15 PM Medical Record Number: [id number] Patient Account Number: [id number] Date of Birth/Sex: 1986-10-21 (35 y.o. F) Treating RN: Donnamarie Poag Primary Care Danijah Noh: Stoney Bang Other Clinician: Referring Vaneta Hammontree: Stoney Bang Treating Lucianne Smestad/Extender: Skipper Cliche in Treatment: 1 Active Problems Location of Pain Severity and Description of Pain Patient Has Paino Yes Site Locations Pain Location: Generalized Pain, Pain in Ulcers Rate the pain. Current Pain Level: 8 Pain Management and Medication Current Pain Management: Electronic Signature(s) Signed: 06/06/2021 4:15:24 PM By: Donnamarie Poag Entered By: Donnamarie Poag on 06/06/2021  13:21:04 Jaime [name] ([id number]) -------------------------------------------------------------------------------- Patient/Caregiver Education Details Patient Name: Jaime [name] Date of Service: 06/06/2021 1:15 PM Medical Record Number: [id number] Patient Account Number: [id number] Date of Birth/Gender: 07/03/1986 (35 y.o. F) Treating RN: Donnamarie Poag Primary Care Physician: Stoney Bang Other Clinician: Referring Physician: Stoney Bang Treating Physician/Extender: Skipper Cliche in Treatment: 1 Education Assessment Education Provided To: Patient Education Topics Provided Basic Hygiene: Wound Debridement: Wound/Skin Impairment: Electronic Signature(s) Signed: 06/06/2021 4:15:24 PM By: Donnamarie Poag Entered By: Donnamarie Poag on 06/06/2021 14:47:41 Jaime [name] ([id number]) -------------------------------------------------------------------------------- Wound Assessment Details Patient Name: Jaime [name] Date of Service: 06/06/2021 1:15 PM Medical Record Number: [id number] Patient Account Number: [id number] Date of Birth/Sex: 02/03/1986 (35 y.o. F) Treating RN: Donnamarie Poag Primary Care Tequisha Maahs: Stoney Bang Other Clinician: Referring Sharmila Wrobleski: Stoney Bang Treating Haile Bosler/Extender: Skipper Cliche in Treatment: 1 Wound Status Wound Number: 1 Primary Pressure Ulcer Etiology: Wound Location: Coccyx Wound Status: Open Wounding Event: Pressure Injury Comorbid Chronic Obstructive Pulmonary Disease (COPD), Type II Date Acquired: 05/02/2021 History: Diabetes, Seizure Disorder Weeks Of Treatment: 1 Clustered Wound: No Photos Wound Measurements Length: (cm) 0.6 Width: (cm) 0.3 Depth: (cm) 0.1 Area: (cm) 0.141 Volume: (cm) 0.014 % Reduction in Area: 45.6% % Reduction  in Volume: 46.2% Epithelialization: None Tunneling: No Undermining: No Wound Description Classification: Category/Stage III Exudate Amount: Medium Exudate Type: Serosanguineous Exudate  Color: red, brown Foul Odor After Cleansing: No Slough/Fibrino Yes Wound Bed Granulation Amount: Medium (34-66%) Exposed Structure Granulation Quality: Red, Pink Fascia Exposed: No Necrotic Amount: Medium (34-66%) Fat Layer (Subcutaneous Tissue) Exposed: Yes Necrotic Quality: Adherent Slough Tendon Exposed: No Muscle Exposed: No Joint Exposed: No Bone Exposed: No Treatment Notes Wound #1 (Coccyx) Cleanser Soap and Water Discharge Instruction: Gently cleanse wound with antibacterial soap, rinse and pat dry prior to dressing wounds Peri-Wound Care SHANIKQUA, ZARZYCKI. ([id number]) Topical Primary Dressing Prisma 4.34 (in) Discharge Instruction: Moisten w/normal saline or sterile water; Cover wound as directed. Do not remove from wound bed. Secondary Dressing Coverlet Latex-Free Fabric Adhesive Dressings Discharge Instruction: 1.5 x 2 Secured With Compression Wrap Compression Stockings Add-Ons Electronic Signature(s) Signed: 06/06/2021 4:15:24 PM By: Donnamarie Poag Entered By: Donnamarie Poag on 06/06/2021 13:30:14 Jaime [name] ([id number]) -------------------------------------------------------------------------------- Wound Assessment Details Patient Name: Jaime [name] Date of Service: 06/06/2021 1:15 PM Medical Record Number: [id number] Patient Account Number: [id number] Date of Birth/Sex: 04-25-1986 (35 y.o. F) Treating RN: Donnamarie Poag Primary Care Shann Merrick: Stoney Bang Other Clinician: Referring Draeden Kellman: Stoney Bang Treating Jaivion Kingsley/Extender: Skipper Cliche in Treatment: 1 Wound Status Wound Number: 2 Primary Pressure Ulcer Etiology: Wound Location: Left, Proximal Gluteus Wound Status: Open Wounding Event: Gradually Appeared Comorbid Chronic Obstructive Pulmonary Disease (COPD), Type II Date Acquired: 05/02/2021 History: Diabetes, Seizure Disorder Weeks Of Treatment: 1 Clustered Wound: No Photos Wound Measurements Length: (cm) 0.5 Width: (cm)  0.7 Depth: (cm) 0.1 Area: (cm) 0.275 Volume: (cm) 0.027 % Reduction in Area: 41.6% % Reduction in Volume: 42.6% Epithelialization: None Tunneling: No Undermining: No Wound Description Classification: Category/Stage III Exudate Amount: Medium Exudate Type: Serosanguineous Exudate Color: red, brown Foul Odor After Cleansing: No Slough/Fibrino Yes Wound Bed Granulation Amount: Medium (34-66%) Exposed Structure Granulation Quality: Red, Pink Fascia Exposed: No Necrotic Amount: Medium (34-66%) Fat Layer (Subcutaneous Tissue) Exposed: Yes Necrotic Quality: Adherent Slough Tendon Exposed: No Muscle Exposed: No Joint Exposed: No Bone Exposed: No Treatment Notes Wound #2 (Gluteus) Wound Laterality: Left, Proximal Cleanser Soap and Water Discharge Instruction: Gently cleanse wound with antibacterial soap, rinse and pat dry prior to dressing wounds Peri-Wound Care NORALEE, DUTKO. ([id number]) Topical Primary Dressing Prisma 4.34 (in) Discharge Instruction: Moisten w/normal saline or sterile water; Cover wound as directed. Do not remove from wound bed. Secondary Dressing Coverlet Latex-Free Fabric Adhesive Dressings Discharge Instruction: 1.5 x 2 Secured With Compression Wrap Compression Stockings Add-Ons Electronic Signature(s) Signed: 06/06/2021 4:15:24 PM By: Donnamarie Poag Entered By: Donnamarie Poag on 06/06/2021 13:30:50 Jaime [name] ([id number]) -------------------------------------------------------------------------------- Wound Assessment Details Patient Name: Jaime [name] Date of Service: 06/06/2021 1:15 PM Medical Record Number: [id number] Patient Account Number: [id number] Date of Birth/Sex: 1986-06-28 (35 y.o. F) Treating RN: Donnamarie Poag Primary Care Sharif Rendell: Stoney Bang Other Clinician: Referring Peri Kreft: Stoney Bang Treating Jearlene Bridwell/Extender: Skipper Cliche in Treatment: 1 Wound Status Wound Number: 3 Primary Pressure Ulcer Etiology: Wound  Location: Left, Distal Gluteus Wound Status: Open Wounding Event: Gradually Appeared Comorbid Chronic Obstructive Pulmonary Disease (COPD), Type II Date Acquired: 05/02/2021 History: Diabetes, Seizure Disorder Weeks Of Treatment: 1 Clustered Wound: No Photos Wound Measurements Length: (cm) 0.4 Width: (cm) 0.4 Depth: (cm) 0.1 Area: (cm) 0.126 Volume: (cm) 0.013 % Reduction in Area: 79.2% % Reduction in Volume: 78.3% Epithelialization: None Tunneling: No Undermining: No Wound Description Classification: Category/Stage III Exudate Amount: Medium Exudate Type:  Serosanguineous Exudate Color: red, brown Foul Odor After Cleansing: No Slough/Fibrino Yes Wound Bed Granulation Amount: Medium (34-66%) Exposed Structure Granulation Quality: Red, Pink Fascia Exposed: No Necrotic Amount: Medium (34-66%) Fat Layer (Subcutaneous Tissue) Exposed: Yes Necrotic Quality: Adherent Slough Tendon Exposed: No Muscle Exposed: No Joint Exposed: No Bone Exposed: No Treatment Notes Wound #3 (Gluteus) Wound Laterality: Left, Distal Cleanser Soap and Water Discharge Instruction: Gently cleanse wound with antibacterial soap, rinse and pat dry prior to dressing wounds Peri-Wound Care SIGNORA, ZUCCO. ([id number]) Topical Primary Dressing Prisma 4.34 (in) Discharge Instruction: Moisten w/normal saline or sterile water; Cover wound as directed. Do not remove from wound bed. Secondary Dressing Coverlet Latex-Free Fabric Adhesive Dressings Discharge Instruction: 1.5 x 2 Secured With Compression Wrap Compression Stockings Add-Ons Electronic Signature(s) Signed: 06/06/2021 4:15:24 PM By: Donnamarie Poag Entered By: Donnamarie Poag on 06/06/2021 13:31:25 Jaime [name] ([id number]) -------------------------------------------------------------------------------- Wound Assessment Details Patient Name: Jaime [name] Date of Service: 06/06/2021 1:15 PM Medical Record Number: [id number] Patient  Account Number: [id number] Date of Birth/Sex: 1986-04-01 (35 y.o. F) Treating RN: Donnamarie Poag Primary Care Nosson Wender: Stoney Bang Other Clinician: Referring Mickie Badders: Stoney Bang Treating Ian Cavey/Extender: Skipper Cliche in Treatment: 1 Wound Status Wound Number: 4 Primary Trauma, Other Etiology: Wound Location: Right Knee Wound Status: Open Wounding Event: Skin Tear/Laceration Comorbid Chronic Obstructive Pulmonary Disease (COPD), Type II Date Acquired: 05/23/2021 History: Diabetes, Seizure Disorder Weeks Of Treatment: 0 Clustered Wound: No Photos Wound Measurements Length: (cm) 2.5 Width: (cm) 1.5 Depth: (cm) 0.1 Area: (cm) 2.945 Volume: (cm) 0.295 % Reduction in Area: % Reduction in Volume: Tunneling: No Undermining: No Wound Description Classification: Full Thickness Without Exposed Support Structures Exudate Amount: None Present Foul Odor After Cleansing: No Slough/Fibrino Yes Wound Bed Granulation Amount: None Present (0%) Necrotic Amount: Large (67-100%) Necrotic Quality: Eschar Treatment Notes Wound #4 (Knee) Wound Laterality: Right Cleanser Soap and Water Discharge Instruction: Gently cleanse wound with antibacterial soap, rinse and pat dry prior to dressing wounds Peri-Wound Care Topical Primary Dressing Xeroform-HBD 2x2 (in/in) Discharge Instruction: Apply Xeroform-HBD 2x2 (in/in) as directed CELESTIA, DUVA ([id number]) Secondary Dressing Coverlet Latex-Free Fabric Adhesive Dressings Discharge Instruction: 1.5 x 2 Secured With Compression Wrap Compression Stockings Add-Ons Electronic Signature(s) Signed: 06/06/2021 4:15:24 PM By: Donnamarie Poag Entered By: Donnamarie Poag on 06/06/2021 13:32:48 Jaime [name] ([id number]) -------------------------------------------------------------------------------- Vitals Details Patient Name: Jaime [name] Date of Service: 06/06/2021 1:15 PM Medical Record Number: [id number] Patient Account Number:  [id number] Date of Birth/Sex: 03/22/1986 (35 y.o. F) Treating RN: Donnamarie Poag Primary Care Shemuel Harkleroad: Stoney Bang Other Clinician: Referring Asberry Lascola: Stoney Bang Treating Broly Hatfield/Extender: Skipper Cliche in Treatment: 1 Vital Signs Time Taken: 13:20 Temperature (F): 97.7 Height (in): 70 Pulse (bpm): 99 Weight (lbs): 301 Respiratory Rate (breaths/min): 18 Body Mass Index (BMI): 43.2 Blood Pressure (mmHg): 123/79 Reference Range: 80 - 120 mg / dl Electronic Signature(s) Signed: 06/06/2021 4:15:24 PM By: Donnamarie Poag Entered ByDonnamarie Poag on 06/06/2021 13:20:54

## 2021-06-06 NOTE — Progress Notes (Addendum)
RYLEA, SELWAY ([id number]) Visit Report for 06/06/2021 Chief Complaint Document Details Patient Name: Jaime [name], Jaime [name] Date of Service: 06/06/2021 1:15 PM Medical Record Number: [id number] Patient Account Number: [id number] Date of Birth/Sex: 24-Feb-1986 (35 y.o. F) Treating RN: Donnamarie Poag Primary Care Provider: Stoney Bang Other Clinician: Referring Provider: Stoney Bang Treating Provider/Extender: Skipper Cliche in Treatment: 1 Information Obtained from: Patient Chief Complaint Gluteal and sacral pressure ulcers Electronic Signature(s) Signed: 06/06/2021 1:29:50 PM By: Worthy Keeler PA-C Entered By: Worthy Keeler on 06/06/2021 13:29:50 Jaime [name] ([id number]) -------------------------------------------------------------------------------- Debridement Details Patient Name: Jaime [name] Date of Service: 06/06/2021 1:15 PM Medical Record Number: [id number] Patient Account Number: [id number] Date of Birth/Sex: 08-Aug-1986 (35 y.o. F) Treating RN: Donnamarie Poag Primary Care Provider: Stoney Bang Other Clinician: Referring Provider: Stoney Bang Treating Provider/Extender: Skipper Cliche in Treatment: 1 Debridement Performed for Wound #2 Left,Proximal Gluteus Assessment: Performed By: Physician Tommie Sams., PA-C Debridement Type: Debridement Level of Consciousness (Pre- Awake and Alert procedure): Pre-procedure Verification/Time Out Yes - 14:38 Taken: Start Time: 14:38 Total Area Debrided (L x W): 0.5 (cm) x 0.7 (cm) = 0.35 (cm) Tissue and other material Slough, Subcutaneous, Slough debrided: Level: Skin/Subcutaneous Tissue Debridement Description: Excisional Instrument: Curette Bleeding: Minimum Hemostasis Achieved: Pressure End Time: 14:40 Response to Treatment: Procedure was tolerated well Level of Consciousness (Post- Awake and Alert procedure): Post Debridement Measurements of Total Wound Length: (cm) 0.5 Stage: Category/Stage  III Width: (cm) 0.7 Depth: (cm) 0.1 Volume: (cm) 0.027 Character of Wound/Ulcer Post Debridement: Improved Post Procedure Diagnosis Same as Pre-procedure Electronic Signature(s) Signed: 06/06/2021 4:15:24 PM By: Donnamarie Poag Signed: 06/06/2021 6:21:30 PM By: Worthy Keeler PA-C Entered By: Donnamarie Poag on 06/06/2021 14:40:15 Jaime [name] ([id number]) -------------------------------------------------------------------------------- Debridement Details Patient Name: Jaime [name] Date of Service: 06/06/2021 1:15 PM Medical Record Number: [id number] Patient Account Number: [id number] Date of Birth/Sex: [birth year] (35 y.o. F) Treating RN: Donnamarie Poag Primary Care Provider: Stoney Bang Other Clinician: Referring Provider: Stoney Bang Treating Provider/Extender: Skipper Cliche in Treatment: 1 Debridement Performed for Wound #3 Left,Distal Gluteus Assessment: Performed By: Physician Tommie Sams., PA-C Debridement Type: Debridement Level of Consciousness (Pre- Awake and Alert procedure): Pre-procedure Verification/Time Out Yes - 14:38 Taken: Start Time: 14:38 Total Area Debrided (L x W): 0.4 (cm) x 0.4 (cm) = 0.16 (cm) Tissue and other material Viable, Non-Viable, Slough, Subcutaneous, Slough debrided: Level: Skin/Subcutaneous Tissue Debridement Description: Excisional Instrument: Curette Bleeding: Minimum Hemostasis Achieved: Pressure End Time: 14:40 Response to Treatment: Procedure was tolerated well Level of Consciousness (Post- Awake and Alert procedure): Post Debridement Measurements of Total Wound Length: (cm) 0.4 Stage: Category/Stage III Width: (cm) 0.4 Depth: (cm) 0.1 Volume: (cm) 0.013 Character of Wound/Ulcer Post Debridement: Improved Post Procedure Diagnosis Same as Pre-procedure Electronic Signature(s) Signed: 06/06/2021 4:15:24 PM By: Donnamarie Poag Signed: 06/06/2021 6:21:30 PM By: Worthy Keeler PA-C Entered By: Donnamarie Poag on 06/06/2021  14:40:46 Jaime [name] ([id number]) -------------------------------------------------------------------------------- Debridement Details Patient Name: Jaime [name] Date of Service: 06/06/2021 1:15 PM Medical Record Number: [id number] Patient Account Number: [id number] Date of Birth/Sex: 1986/04/05 (35 y.o. F) Treating RN: Donnamarie Poag Primary Care Provider: Stoney Bang Other Clinician: Referring Provider: Stoney Bang Treating Provider/Extender: Skipper Cliche in Treatment: 1 Debridement Performed for Wound #4 Right Knee Assessment: Performed By: Physician Tommie Sams., PA-C Debridement Type: Debridement Level of Consciousness (Pre- Awake and Alert procedure): Pre-procedure Verification/Time Out Yes - 14:38 Taken: Start Time: 14:38 Total Area Debrided (L x W): 2.5 (  cm) x 1.5 (cm) = 3.75 (cm) Tissue and other material Viable, Non-Viable, Eschar, Slough, Subcutaneous, Slough debrided: Level: Skin/Subcutaneous Tissue Debridement Description: Excisional Instrument: Curette Bleeding: Minimum Hemostasis Achieved: Pressure End Time: 14:40 Response to Treatment: Procedure was tolerated well Level of Consciousness (Post- Awake and Alert procedure): Post Debridement Measurements of Total Wound Length: (cm) 1.4 Width: (cm) 0.6 Depth: (cm) 0.1 Volume: (cm) 0.066 Character of Wound/Ulcer Post Debridement: Improved Post Procedure Diagnosis Same as Pre-procedure Electronic Signature(s) Signed: 06/06/2021 4:15:24 PM By: Donnamarie Poag Signed: 06/06/2021 6:21:30 PM By: Worthy Keeler PA-C Entered By: Donnamarie Poag on 06/06/2021 14:43:56 Jaime [name] ([id number]) -------------------------------------------------------------------------------- HPI Details Patient Name: Jaime [name] Date of Service: 06/06/2021 1:15 PM Medical Record Number: [id number] Patient Account Number: [id number] Date of Birth/Sex: November 16, 1985 (35 y.o. F) Treating RN: Donnamarie Poag Primary Care  Provider: Stoney Bang Other Clinician: Referring Provider: Stoney Bang Treating Provider/Extender: Skipper Cliche in Treatment: 1 History of Present Illness HPI Description: 05/29/2021 upon evaluation today patient presents for initial evaluation here in our clinic concerning issues she has been having with what appears to be pressure ulcerations on the gluteal and sacral region. She tells me that she does tend to sit a lot. With that being said it does appear that these are pressure ulcers to be honest. I do not see any evidence of infection but nonetheless I do believe that the patient is having some discomfort here. She has been placed on doxycycline she is also previously used Allevyn dressings but no specific wound care dressings have been utilized up to this point. These have been present for about a month she tells me. She does have diabetes with hemoglobin A1c of 10.3 she did see her endocrinologist yesterday she is try to work on that they got onto her she tells me. 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 She is on methadone looks like breakthrough oxycodone at this time. Nonetheless I believe that in general she seems to be doing pretty well but we do need to try and see what we can do to get these wounds healed for her as they are quite painful. 06/06/2021 upon evaluation today patient appears to be doing decently well in regard to the wounds in the gluteal region. She continues to have some pain here. There is some slough noted on the to try to sharply debride this away to try to help with healing currently. Fortunately I do feel like she is making progress here however. Unfortunately she does have an issue on her knee where she fell and I do believe we will get a need to perform some debridement here to clear this away to allow to heal more effectively and quickly. Electronic  Signature(s) Signed: 06/06/2021 3:29:47 PM By: Worthy Keeler PA-C Entered By: Worthy Keeler on 06/06/2021 15:29:47 Jaime [name] ([id number]) -------------------------------------------------------------------------------- Physical Exam Details Patient Name: Jaime [name] Date of Service: 06/06/2021 1:15 PM Medical Record Number: [id number] Patient Account Number: [id number] Date of Birth/Sex: 21-Oct-1986 (35 y.o. F) Treating RN: Donnamarie Poag Primary Care Provider: Stoney Bang Other Clinician: Referring Provider: Stoney Bang Treating Provider/Extender: Skipper Cliche in Treatment: 1 Constitutional Well-nourished and well-hydrated in no acute distress. Respiratory normal breathing without difficulty. Psychiatric this patient is able to make decisions and demonstrates good insight into disease process. Alert and Oriented x 3. pleasant and cooperative. Notes Patient's wound bed  showed signs of good granulation epithelization at this point. There does not appear to be any signs of active infection which is great news and overall very pleased with where things stand. No fevers, chills, nausea, vomiting, or diarrhea. I did perform debridement of the 2 inferior wounds on the gluteal region as well as the knee. Electronic Signature(s) Signed: 06/06/2021 3:30:06 PM By: Worthy Keeler PA-C Entered By: Worthy Keeler on 06/06/2021 15:30:06 Jaime [name] ([id number]) -------------------------------------------------------------------------------- Physician Orders Details Patient Name: Jaime [name] Date of Service: 06/06/2021 1:15 PM Medical Record Number: [id number] Patient Account Number: [id number] Date of Birth/Sex: 10/06/1986 (35 y.o. F) Treating RN: Donnamarie Poag Primary Care Provider: Stoney Bang Other Clinician: Referring Provider: Stoney Bang Treating Provider/Extender: Skipper Cliche in Treatment: 1 Verbal / Phone Orders: No Diagnosis Coding ICD-10  Coding Code Description [phone] Pressure ulcer of left buttock, stage 3 L89.153 Pressure ulcer of sacral region, stage 3 L97.812 Non-pressure chronic ulcer of other part of right lower leg with fat layer exposed E11.622 Type 2 diabetes mellitus with other skin ulcer J44.9 Chronic obstructive pulmonary disease, unspecified L93.0 Discoid lupus erythematosus Follow-up Appointments o Return Appointment in 1 week. Bathing/ Shower/ Hygiene o Wash wounds with antibacterial soap and water. o May shower; gently cleanse wound with antibacterial soap, rinse and pat dry prior to dressing wounds - change dressings immediately after bathing Off-Loading o Gel wheelchair cushion - suggested for where you sit Additional Orders / Instructions o Follow Nutritious Diet and Increase Protein Intake Wound Treatment Wound #1 - Coccyx Cleanser: Soap and Water 3 x Per Week/30 Days Discharge Instructions: Gently cleanse wound with antibacterial soap, rinse and pat dry prior to dressing wounds Primary Dressing: Prisma 4.34 (in) 3 x Per Week/30 Days Discharge Instructions: Moisten w/normal saline or sterile water; Cover wound as directed. Do not remove from wound bed. Secondary Dressing: Coverlet Latex-Free Fabric Adhesive Dressings 3 x Per Week/30 Days Discharge Instructions: 1.5 x 2 Wound #2 - Gluteus Wound Laterality: Left, Proximal Cleanser: Soap and Water 3 x Per Week/30 Days Discharge Instructions: Gently cleanse wound with antibacterial soap, rinse and pat dry prior to dressing wounds Primary Dressing: Prisma 4.34 (in) 3 x Per Week/30 Days Discharge Instructions: Moisten w/normal saline or sterile water; Cover wound as directed. Do not remove from wound bed. Secondary Dressing: Coverlet Latex-Free Fabric Adhesive Dressings 3 x Per Week/30 Days Discharge Instructions: 1.5 x 2 Wound #3 - Gluteus Wound Laterality: Left, Distal Cleanser: Soap and Water 3 x Per Week/30 Days Discharge Instructions:  Gently cleanse wound with antibacterial soap, rinse and pat dry prior to dressing wounds Primary Dressing: Prisma 4.34 (in) 3 x Per Week/30 Days Discharge Instructions: Moisten w/normal saline or sterile water; Cover wound as directed. Do not remove from wound bed. Secondary Dressing: Coverlet Latex-Free Fabric Adhesive Dressings 3 x Per Week/30 Days Jaime [name], Jaime [name] ([id number]) Discharge Instructions: 1.5 x 2 Wound #4 - Knee Wound Laterality: Right Cleanser: Soap and Water 1 x Per Day/30 Days Discharge Instructions: Gently cleanse wound with antibacterial soap, rinse and pat dry prior to dressing wounds Primary Dressing: Xeroform-HBD 2x2 (in/in) 1 x Per Day/30 Days Discharge Instructions: Apply Xeroform-HBD 2x2 (in/in) as directed Secondary Dressing: Coverlet Latex-Free Fabric Adhesive Dressings 1 x Per Day/30 Days Discharge Instructions: 1.5 x 2 Electronic Signature(s) Signed: 06/06/2021 4:15:24 PM By: Donnamarie Poag Signed: 06/06/2021 6:21:30 PM By: Worthy Keeler PA-C Entered By: Donnamarie Poag on 06/06/2021 14:47:03 Remus Blake D. ([id number]) -------------------------------------------------------------------------------- Problem List Details Patient Name: Jaime [name],  Jaime [name] Date of Service: 06/06/2021 1:15 PM Medical Record Number: [id number] Patient Account Number: [id number] Date of Birth/Sex: 24-Dec-1985 (35 y.o. F) Treating RN: Donnamarie Poag Primary Care Provider: Stoney Bang Other Clinician: Referring Provider: Stoney Bang Treating Provider/Extender: Skipper Cliche in Treatment: 1 Active Problems ICD-10 Encounter Code Description Active Date MDM Diagnosis [phone] Pressure ulcer of left buttock, stage 3 05/29/2021 No Yes L89.153 Pressure ulcer of sacral region, stage 3 05/29/2021 No Yes L97.812 Non-pressure chronic ulcer of other part of right lower leg with fat layer 06/06/2021 No Yes exposed E11.622 Type 2 diabetes mellitus with other skin ulcer 05/29/2021 No Yes J44.9  Chronic obstructive pulmonary disease, unspecified 05/29/2021 No Yes L93.0 Discoid lupus erythematosus 05/29/2021 No Yes Inactive Problems Resolved Problems Electronic Signature(s) Signed: 06/06/2021 2:32:15 PM By: Worthy Keeler PA-C Previous Signature: 06/06/2021 1:29:44 PM Version By: Worthy Keeler PA-C Entered By: Worthy Keeler on 06/06/2021 14:32:15 Jaime [name] ([id number]) -------------------------------------------------------------------------------- Progress Note Details Patient Name: Jaime [name] Date of Service: 06/06/2021 1:15 PM Medical Record Number: [id number] Patient Account Number: [id number] Date of Birth/Sex: 01/08/1986 (35 y.o. F) Treating RN: Donnamarie Poag Primary Care Provider: Stoney Bang Other Clinician: Referring Provider: Stoney Bang Treating Provider/Extender: Skipper Cliche in Treatment: 1 Subjective Chief Complaint Information obtained from Patient Gluteal and sacral pressure ulcers History of Present Illness (HPI) 05/29/2021 upon evaluation today patient presents for initial evaluation here in our clinic concerning issues she has been having with what appears to be pressure ulcerations on the gluteal and sacral region. She tells me that she does tend to sit a lot. With that being said it does appear that these are pressure ulcers to be honest. I do not see any evidence of infection but nonetheless I do believe that the patient is having some discomfort here. She has been placed on doxycycline she is also previously used Allevyn dressings but no specific wound care dressings have been utilized up to this point. These have been present for about a month she tells me. She does have diabetes with hemoglobin A1c of 10.3 she did see her endocrinologist yesterday she is try to work on that they got onto her she tells me. 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 She  is on methadone looks like breakthrough oxycodone at this time. Nonetheless I believe that in general she seems to be doing pretty well but we do need to try and see what we can do to get these wounds healed for her as they are quite painful. 06/06/2021 upon evaluation today patient appears to be doing decently well in regard to the wounds in the gluteal region. She continues to have some pain here. There is some slough noted on the to try to sharply debride this away to try to help with healing currently. Fortunately I do feel like she is making progress here however. Unfortunately she does have an issue on her knee where she fell and I do believe we will get a need to perform some debridement here to clear this away to allow to heal more effectively and quickly. Objective Constitutional Well-nourished and well-hydrated in no acute distress. Vitals Time Taken: 1:20 PM, Height: 70 in, Weight: 301 lbs, BMI: 43.2, Temperature: 97.7 F, Pulse: 99 bpm, Respiratory Rate: 18 breaths/min, Blood Pressure: 123/79 mmHg. Respiratory normal breathing without difficulty. Psychiatric this patient is able to make decisions and  demonstrates good insight into disease process. Alert and Oriented x 3. pleasant and cooperative. General Notes: Patient's wound bed showed signs of good granulation epithelization at this point. There does not appear to be any signs of active infection which is great news and overall very pleased with where things stand. No fevers, chills, nausea, vomiting, or diarrhea. I did perform debridement of the 2 inferior wounds on the gluteal region as well as the knee. Integumentary (Hair, Skin) Wound #1 status is Open. Original cause of wound was Pressure Injury. The date acquired was: 05/02/2021. The wound has been in treatment 1 weeks. The wound is located on the Coccyx. The wound measures 0.6cm length x 0.3cm width x 0.1cm depth; 0.141cm^2 area and 0.014cm^3 volume. There is Fat Layer  (Subcutaneous Tissue) exposed. There is no tunneling or undermining noted. There is a medium amount of serosanguineous drainage noted. There is medium (34-66%) red, pink granulation within the wound bed. There is a medium (34-66%) amount of necrotic tissue within the wound bed including Adherent Slough. Wound #2 status is Open. Original cause of wound was Gradually Appeared. The date acquired was: 05/02/2021. The wound has been in treatment 1 weeks. The wound is located on the Left,Proximal Gluteus. The wound measures 0.5cm length x 0.7cm width x 0.1cm depth; 0.275cm^2 area and 0.027cm^3 volume. There is Fat Layer (Subcutaneous Tissue) exposed. There is no tunneling or undermining noted. There is a medium amount of serosanguineous drainage noted. There is medium (34-66%) red, pink granulation within the wound bed. There is a medium (34-66%) amount of necrotic tissue within the wound bed including Adherent Slough. Wound #3 status is Open. Original cause of wound was Gradually Appeared. The date acquired was: 05/02/2021. The wound has been in treatment 1 weeks. The wound is located on the Left,Distal Gluteus. The wound measures 0.4cm length x 0.4cm width x 0.1cm depth; 0.126cm^2 area and Jaime [name], Jaime D. ([id number]) 0.013cm^3 volume. There is Fat Layer (Subcutaneous Tissue) exposed. There is no tunneling or undermining noted. There is a medium amount of serosanguineous drainage noted. There is medium (34-66%) red, pink granulation within the wound bed. There is a medium (34-66%) amount of necrotic tissue within the wound bed including Adherent Slough. Wound #4 status is Open. Original cause of wound was Skin Tear/Laceration. The date acquired was: 05/23/2021. The wound is located on the Right Knee. The wound measures 2.5cm length x 1.5cm width x 0.1cm depth; 2.945cm^2 area and 0.295cm^3 volume. There is no tunneling or undermining noted. There is a none present amount of drainage noted. There is no  granulation within the wound bed. There is a large (67-100%) amount of necrotic tissue within the wound bed including Eschar. Assessment Active Problems ICD-10 Pressure ulcer of left buttock, stage 3 Pressure ulcer of sacral region, stage 3 Non-pressure chronic ulcer of other part of right lower leg with fat layer exposed Type 2 diabetes mellitus with other skin ulcer Chronic obstructive pulmonary disease, unspecified Discoid lupus erythematosus Procedures Wound #2 Pre-procedure diagnosis of Wound #2 is a Pressure Ulcer located on the Left,Proximal Gluteus . There was a Excisional Skin/Subcutaneous Tissue Debridement with a total area of 0.35 sq cm performed by Tommie Sams., PA-C. With the following instrument(s): Curette Material removed includes Subcutaneous Tissue and Slough and. A time out was conducted at 14:38, prior to the start of the procedure. A Minimum amount of bleeding was controlled with Pressure. The procedure was tolerated well. Post Debridement Measurements: 0.5cm length x 0.7cm width x 0.1cm depth;  0.027cm^3 volume. Post debridement Stage noted as Category/Stage III. Character of Wound/Ulcer Post Debridement is improved. Post procedure Diagnosis Wound #2: Same as Pre-Procedure Wound #3 Pre-procedure diagnosis of Wound #3 is a Pressure Ulcer located on the Left,Distal Gluteus . There was a Excisional Skin/Subcutaneous Tissue Debridement with a total area of 0.16 sq cm performed by Tommie Sams., PA-C. With the following instrument(s): Curette to remove Viable and Non-Viable tissue/material. Material removed includes Subcutaneous Tissue and Slough and. A time out was conducted at 14:38, prior to the start of the procedure. A Minimum amount of bleeding was controlled with Pressure. The procedure was tolerated well. Post Debridement Measurements: 0.4cm length x 0.4cm width x 0.1cm depth; 0.013cm^3 volume. Post debridement Stage noted as Category/Stage III. Character of  Wound/Ulcer Post Debridement is improved. Post procedure Diagnosis Wound #3: Same as Pre-Procedure Wound #4 Pre-procedure diagnosis of Wound #4 is a Trauma, Other located on the Right Knee . There was a Excisional Skin/Subcutaneous Tissue Debridement with a total area of 3.75 sq cm performed by Tommie Sams., PA-C. With the following instrument(s): Curette to remove Viable and Non-Viable tissue/material. Material removed includes Eschar, Subcutaneous Tissue, and Slough. A time out was conducted at 14:38, prior to the start of the procedure. A Minimum amount of bleeding was controlled with Pressure. The procedure was tolerated well. Post Debridement Measurements: 1.4cm length x 0.6cm width x 0.1cm depth; 0.066cm^3 volume. Character of Wound/Ulcer Post Debridement is improved. Post procedure Diagnosis Wound #4: Same as Pre-Procedure Plan Follow-up Appointments: Return Appointment in 1 week. Bathing/ Shower/ Hygiene: Wash wounds with antibacterial soap and water. May shower; gently cleanse wound with antibacterial soap, rinse and pat dry prior to dressing wounds - change dressings immediately after bathing Off-Loading: Gel wheelchair cushion - suggested for where you sit Jaime [name], Jaime [name]. ([id number]) Additional Orders / Instructions: Follow Nutritious Diet and Increase Protein Intake WOUND #1: - Coccyx Wound Laterality: Cleanser: Soap and Water 3 x Per Week/30 Days Discharge Instructions: Gently cleanse wound with antibacterial soap, rinse and pat dry prior to dressing wounds Primary Dressing: Prisma 4.34 (in) 3 x Per Week/30 Days Discharge Instructions: Moisten w/normal saline or sterile water; Cover wound as directed. Do not remove from wound bed. Secondary Dressing: Coverlet Latex-Free Fabric Adhesive Dressings 3 x Per Week/30 Days Discharge Instructions: 1.5 x 2 WOUND #2: - Gluteus Wound Laterality: Left, Proximal Cleanser: Soap and Water 3 x Per Week/30 Days Discharge  Instructions: Gently cleanse wound with antibacterial soap, rinse and pat dry prior to dressing wounds Primary Dressing: Prisma 4.34 (in) 3 x Per Week/30 Days Discharge Instructions: Moisten w/normal saline or sterile water; Cover wound as directed. Do not remove from wound bed. Secondary Dressing: Coverlet Latex-Free Fabric Adhesive Dressings 3 x Per Week/30 Days Discharge Instructions: 1.5 x 2 WOUND #3: - Gluteus Wound Laterality: Left, Distal Cleanser: Soap and Water 3 x Per Week/30 Days Discharge Instructions: Gently cleanse wound with antibacterial soap, rinse and pat dry prior to dressing wounds Primary Dressing: Prisma 4.34 (in) 3 x Per Week/30 Days Discharge Instructions: Moisten w/normal saline or sterile water; Cover wound as directed. Do not remove from wound bed. Secondary Dressing: Coverlet Latex-Free Fabric Adhesive Dressings 3 x Per Week/30 Days Discharge Instructions: 1.5 x 2 WOUND #4: - Knee Wound Laterality: Right Cleanser: Soap and Water 1 x Per Day/30 Days Discharge Instructions: Gently cleanse wound with antibacterial soap, rinse and pat dry prior to dressing wounds Primary Dressing: Xeroform-HBD 2x2 (in/in) 1 x Per Day/30 Days Discharge Instructions:  Apply Xeroform-HBD 2x2 (in/in) as directed Secondary Dressing: Coverlet Latex-Free Fabric Adhesive Dressings 1 x Per Day/30 Days Discharge Instructions: 1.5 x 2 1. Would recommend currently that we going continue with the wound care measures as before and the patient is in agreement with plan. This includes the use of the Xeroform gauze to the knee which I think is good to be the best way to go. 2. I am also can recommend that we go ahead and initiate a continuation of treatment with the collagen dressing to the gluteal region which I think is doing a great job. 3. I am also going to recommend that she continue with appropriate offloading I think that still good to be probably the best way to go. We will see patient back  for reevaluation in 1 week here in the clinic. If anything worsens or changes patient will contact our office for additional recommendations. Electronic Signature(s) Signed: 06/06/2021 3:31:41 PM By: Worthy Keeler PA-C Entered By: Worthy Keeler on 06/06/2021 15:31:41 Jaime [name] ([id number]) -------------------------------------------------------------------------------- SuperBill Details Patient Name: Jaime [name] Date of Service: 06/06/2021 Medical Record Number: [id number] Patient Account Number: [id number] Date of Birth/Sex: 1986/02/10 (35 y.o. 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 Facility Procedures CPT4 Code: 15176160 Description: 11042 - DEB SUBQ TISSUE 20 SQ CM/< Modifier: Quantity: 1 CPT4 Code: Description: ICD-10 Diagnosis Description L89.323 Pressure ulcer of left buttock, stage 3 L97.812 Non-pressure chronic ulcer of other part of right lower leg with fat lay Modifier: er exposed Quantity: Physician Procedures CPT4 Code: 7371062 Description: 11042 - WC PHYS SUBQ TISS 20 SQ CM Modifier: Quantity: 1 CPT4 Code: Description: ICD-10 Diagnosis Description L89.323 Pressure ulcer of left buttock, stage 3 L97.812 Non-pressure chronic ulcer of other part of right lower leg with fat lay Modifier: er exposed Quantity: Electronic Signature(s) Signed: 06/06/2021 3:31:59 PM By: Worthy Keeler PA-C Entered By: Worthy Keeler on 06/06/2021 15:31:58

## 2021-06-13 ENCOUNTER — Encounter: Payer: Medicaid Other | Admitting: Physician Assistant

## 2021-06-13 ENCOUNTER — Other Ambulatory Visit: Payer: Self-pay

## 2021-06-13 DIAGNOSIS — E11622 Type 2 diabetes mellitus with other skin ulcer: Secondary | ICD-10-CM | POA: Diagnosis not present

## 2021-06-13 NOTE — Progress Notes (Signed)
CALISTA, CRAIN ([id number]) Visit Report for 06/13/2021 Arrival Information Details Patient Name: Jaime [name], Jaime [name] Date of Service: 06/13/2021 3:45 PM Medical Record Number: [id number] Patient Account Number: [id number] Date of Birth/Sex: [birth year] (35 y.o. F) Treating RN: Cornell Barman Primary Care Aquanetta Schwarz: Stoney Bang Other Clinician: Referring Janny Crute: Stoney Bang Treating Fredrik Mogel/Extender: Skipper Cliche in Treatment: 2 Visit Information History Since Last Visit Added or deleted any medications: No Patient Arrived: Ambulatory Has Dressing in Place as Prescribed: No Arrival Time: 15:55 Pain Present Now: Yes Accompanied By: self Transfer Assistance: None Patient Identification Verified: Yes Secondary Verification Process Completed: Yes Patient Requires Transmission-Based Precautions: No Patient Has Alerts: No Electronic Signature(s) Signed: 06/13/2021 6:04:50 PM By: Gretta Cool, BSN, RN, CWS, Kim RN, BSN Entered By: Gretta Cool, BSN, RN, CWS, Kim on 06/13/2021 15:55:49 Jaime [name] ([id number]) -------------------------------------------------------------------------------- Lower Extremity Assessment Details Patient Name: Jaime [name] Date of Service: 06/13/2021 3:45 PM Medical Record Number: [id number] Patient Account Number: [id number] Date of Birth/Sex: [birth year] (35 y.o. F) Treating RN: Cornell Barman Primary Care Jude Linck: Stoney Bang Other Clinician: Referring Karinne Schmader: Stoney Bang Treating Keyleigh Manninen/Extender: Skipper Cliche in Treatment: 2 Electronic Signature(s) Signed: 06/13/2021 6:04:50 PM By: Gretta Cool, BSN, RN, CWS, Kim RN, BSN Entered By: Gretta Cool, BSN, RN, CWS, Kim on 06/13/2021 16:02:37 CHRISTENA, SUNDERLIN ([id number]) -------------------------------------------------------------------------------- Multi Wound Chart Details Patient Name: Jaime [name] Date of Service: 06/13/2021 3:45 PM Medical Record Number: [id number] Patient Account Number:  [id number] Date of Birth/Sex: May 24, 1986 (35 y.o. F) Treating RN: Cornell Barman Primary Care Aarik Blank: Stoney Bang Other Clinician: Referring Kenard Morawski: Stoney Bang Treating Alleigh Mollica/Extender: Skipper Cliche in Treatment: 2 Vital Signs Height(in): 70 Pulse(bpm): 101 Weight(lbs): 301 Blood Pressure(mmHg): 121/80 Body Mass Index(BMI): 43 Temperature(F): 98.2 Respiratory Rate(breaths/min): 18 Photos: [1:No Photos] [2:No Photos] [3:No Photos] Wound Location: [1:Coccyx] [2:Left, Proximal Gluteus] [3:Left, Distal Gluteus] Wounding Event: [1:Pressure Injury] [2:Gradually Appeared] [3:Gradually Appeared] Primary Etiology: [1:Pressure Ulcer] [2:Pressure Ulcer] [3:Pressure Ulcer] Date Acquired: [1:05/02/2021] [2:05/02/2021] [3:05/02/2021] Weeks of Treatment: [1:2] [2:2] [3:2] Wound Status: [1:Open] [2:Open] [3:Open] Measurements L x W x D (cm) [1:0.1x0.1x0.1] [2:0.6x0.4x0.1] [3:0.8x0.5x0.1] Area (cm) : [1:0.008] [2:0.188] [3:0.314] Volume (cm) : [1:0.001] [2:0.019] [3:0.031] % Reduction in Area: [1:96.90%] [2:60.10%] [3:48.10%] % Reduction in Volume: [1:96.20%] [2:59.60%] [3:48.30%] Classification: [1:Category/Stage III] [2:Category/Stage III] [3:Category/Stage III] Exudate Amount: [1:Medium] [2:Medium] [3:Medium] Exudate Type: [1:Serosanguineous red, brown] [2:Serosanguineous red, brown] [3:Serosanguineous red, brown] Wound Number: 4 N/A N/A Photos: No Photos N/A N/A Wound Location: Right Knee N/A N/A Wounding Event: Skin Tear/Laceration N/A N/A Primary Etiology: Trauma, Other N/A N/A Date Acquired: 05/23/2021 N/A N/A Weeks of Treatment: 1 N/A N/A Wound Status: Healed - Epithelialized N/A N/A Measurements L x W x D (cm) 0x0x0 N/A N/A Area (cm) : 0 N/A N/A Volume (cm) : 0 N/A N/A % Reduction in Area: 100.00% N/A N/A % Reduction in Volume: 100.00% N/A N/A Classification: Full Thickness Without Exposed N/A N/A Support Structures Exudate Amount: None Present N/A N/A Exudate Type: N/A  N/A N/A Exudate Color: N/A N/A N/A Treatment Notes Electronic Signature(s) Signed: 06/13/2021 6:04:50 PM By: Gretta Cool, BSN, RN, CWS, Kim RN, BSN Entered By: Gretta Cool, BSN, RN, CWS, Kim on 06/13/2021 16:18:50 Jaime [name] ([id number]) -------------------------------------------------------------------------------- Multi-Disciplinary Care Plan Details Patient Name: Jaime [name] Date of Service: 06/13/2021 3:45 PM Medical Record Number: [id number] Patient Account Number: [id number] Date of Birth/Sex: 1985/12/18 (35 y.o. F) Treating RN: Cornell Barman Primary Care Zienna Ahlin: Stoney Bang Other Clinician: Referring Burwell Bethel: Stoney Bang Treating Foday Cone/Extender: Skipper Cliche in Treatment: 2 Active Inactive Wound/Skin  Impairment Nursing Diagnoses: Knowledge deficit related to ulceration/compromised skin integrity Goals: Patient/caregiver will verbalize understanding of skin care regimen Date Initiated: 05/29/2021 Date Inactivated: 06/06/2021 Target Resolution Date: 06/29/2021 Goal Status: Met Ulcer/skin breakdown will have a volume reduction of 30% by week 4 Date Initiated: 05/29/2021 Target Resolution Date: 06/29/2021 Goal Status: Active Ulcer/skin breakdown will have a volume reduction of 50% by week 8 Date Initiated: 05/29/2021 Target Resolution Date: 07/30/2021 Goal Status: Active Ulcer/skin breakdown will have a volume reduction of 80% by week 12 Date Initiated: 05/29/2021 Target Resolution Date: 08/29/2021 Goal Status: Active Ulcer/skin breakdown will heal within 14 weeks Date Initiated: 05/29/2021 Target Resolution Date: 09/29/2021 Goal Status: Active Interventions: Assess patient/caregiver ability to obtain necessary supplies Assess patient/caregiver ability to perform ulcer/skin care regimen upon admission and as needed Assess ulceration(s) every visit Notes: Electronic Signature(s) Signed: 06/13/2021 6:04:50 PM By: Gretta Cool, BSN, RN, CWS, Kim RN, BSN Entered By: Gretta Cool,  BSN, RN, CWS, Kim on 06/13/2021 16:18:44 Jaime [name] ([id number]) -------------------------------------------------------------------------------- Pain Assessment Details Patient Name: Jaime [name] Date of Service: 06/13/2021 3:45 PM Medical Record Number: [id number] Patient Account Number: [id number] Date of Birth/Sex: [birth year] (35 y.o. F) Treating RN: Cornell Barman Primary Care Emoni Yang: Stoney Bang Other Clinician: Referring Shiven Junious: Stoney Bang Treating Shriya Aker/Extender: Skipper Cliche in Treatment: 2 Active Problems Location of Pain Severity and Description of Pain Patient Has Paino Yes Site Locations Pain Location: Pain in Ulcers Rate the pain. Current Pain Level: 8 Pain Management and Medication Current Pain Management: Electronic Signature(s) Signed: 06/13/2021 6:04:50 PM By: Gretta Cool, BSN, RN, CWS, Kim RN, BSN Entered By: Gretta Cool, BSN, RN, CWS, Kim on 06/13/2021 15:56:29 Jaime [name] ([id number]) -------------------------------------------------------------------------------- Wound Assessment Details Patient Name: Jaime [name] Date of Service: 06/13/2021 3:45 PM Medical Record Number: [id number] Patient Account Number: [id number] Date of Birth/Sex: [birth year] (35 y.o. F) Treating RN: Cornell Barman Primary Care Benaiah Behan: Stoney Bang Other Clinician: Referring Aryan Sparks: Stoney Bang Treating Lashanna Angelo/Extender: Skipper Cliche in Treatment: 2 Wound Status Wound Number: 1 Primary Etiology: Pressure Ulcer Wound Location: Coccyx Wound Status: Open Wounding Event: Pressure Injury Date Acquired: 05/02/2021 Weeks Of Treatment: 2 Clustered Wound: No Wound Measurements Length: (cm) 0.1 Width: (cm) 0.1 Depth: (cm) 0.1 Area: (cm) 0.008 Volume: (cm) 0.001 % Reduction in Area: 96.9% % Reduction in Volume: 96.2% Wound Description Classification: Category/Stage III Exudate Amount: Medium Exudate Type: Serosanguineous Exudate Color: red,  brown Electronic Signature(s) Signed: 06/13/2021 6:04:50 PM By: Gretta Cool, BSN, RN, CWS, Kim RN, BSN Entered By: Gretta Cool, BSN, RN, CWS, Kim on 06/13/2021 16:02:06 Jaime [name] ([id number]) -------------------------------------------------------------------------------- Wound Assessment Details Patient Name: Jaime [name] Date of Service: 06/13/2021 3:45 PM Medical Record Number: [id number] Patient Account Number: [id number] Date of Birth/Sex: 10/06/1986 (35 y.o. F) Treating RN: Cornell Barman Primary Care Edger Husain: Stoney Bang Other Clinician: Referring Jaiveon Suppes: Stoney Bang Treating Ivaan Liddy/Extender: Skipper Cliche in Treatment: 2 Wound Status Wound Number: 2 Primary Etiology: Pressure Ulcer Wound Location: Left, Proximal Gluteus Wound Status: Open Wounding Event: Gradually Appeared Date Acquired: 05/02/2021 Weeks Of Treatment: 2 Clustered Wound: No Wound Measurements Length: (cm) 0.6 Width: (cm) 0.4 Depth: (cm) 0.1 Area: (cm) 0.188 Volume: (cm) 0.019 % Reduction in Area: 60.1% % Reduction in Volume: 59.6% Wound Description Classification: Category/Stage III Exudate Amount: Medium Exudate Type: Serosanguineous Exudate Color: red, brown Electronic Signature(s) Signed: 06/13/2021 6:04:50 PM By: Gretta Cool, BSN, RN, CWS, Kim RN, BSN Entered By: Gretta Cool, BSN, RN, CWS, Kim on 06/13/2021 16:02:06 Jaime [name] ([id number]) -------------------------------------------------------------------------------- Wound Assessment Details Patient Name: Dorothyann Peng,  Exie Parody Date of Service: 06/13/2021 3:45 PM Medical Record Number: [id number] Patient Account Number: [id number] Date of Birth/Sex: November 28, 1985 (35 y.o. F) Treating RN: Cornell Barman Primary Care Charliee Krenz: Stoney Bang Other Clinician: Referring Eurydice Calixto: Stoney Bang Treating Annai Heick/Extender: Skipper Cliche in Treatment: 2 Wound Status Wound Number: 3 Primary Etiology: Pressure Ulcer Wound Location: Left, Distal  Gluteus Wound Status: Open Wounding Event: Gradually Appeared Date Acquired: 05/02/2021 Weeks Of Treatment: 2 Clustered Wound: No Wound Measurements Length: (cm) 0.8 Width: (cm) 0.5 Depth: (cm) 0.1 Area: (cm) 0.314 Volume: (cm) 0.031 % Reduction in Area: 48.1% % Reduction in Volume: 48.3% Wound Description Classification: Category/Stage III Exudate Amount: Medium Exudate Type: Serosanguineous Exudate Color: red, brown Electronic Signature(s) Signed: 06/13/2021 6:04:50 PM By: Gretta Cool, BSN, RN, CWS, Kim RN, BSN Entered By: Gretta Cool, BSN, RN, CWS, Kim on 06/13/2021 16:02:06 Jaime [name] ([id number]) -------------------------------------------------------------------------------- Wound Assessment Details Patient Name: Jaime [name] Date of Service: 06/13/2021 3:45 PM Medical Record Number: [id number] Patient Account Number: [id number] Date of Birth/Sex: 03-16-1986 (35 y.o. F) Treating RN: Cornell Barman Primary Care Gayl Ivanoff: Stoney Bang Other Clinician: Referring Bradden Tadros: Stoney Bang Treating Puneet Masoner/Extender: Skipper Cliche in Treatment: 2 Wound Status Wound Number: 4 Primary Etiology: Trauma, Other Wound Location: Right Knee Wound Status: Healed - Epithelialized Wounding Event: Skin Tear/Laceration Date Acquired: 05/23/2021 Weeks Of Treatment: 1 Clustered Wound: No Wound Measurements Length: (cm) 0 Width: (cm) 0 Depth: (cm) 0 Area: (cm) 0 Volume: (cm) 0 % Reduction in Area: 100% % Reduction in Volume: 100% Wound Description Classification: Full Thickness Without Exposed Support Structure Exudate Amount: None Present s Electronic Signature(s) Signed: 06/13/2021 6:04:50 PM By: Gretta Cool, BSN, RN, CWS, Kim RN, BSN Entered By: Gretta Cool, BSN, RN, CWS, Kim on 06/13/2021 16:00:15 Jaime [name] ([id number]) -------------------------------------------------------------------------------- Vitals Details Patient Name: Jaime [name] Date of Service: 06/13/2021  3:45 PM Medical Record Number: [id number] Patient Account Number: [id number] Date of Birth/Sex: February 26, 1986 (35 y.o. F) Treating RN: Cornell Barman Primary Care Deneise Getty: Stoney Bang Other Clinician: Referring Yumiko Alkins: Stoney Bang Treating Nelson Noone/Extender: Skipper Cliche in Treatment: 2 Vital Signs Time Taken: 15:55 Temperature (F): 98.2 Height (in): 70 Pulse (bpm): 101 Weight (lbs): 301 Respiratory Rate (breaths/min): 18 Body Mass Index (BMI): 43.2 Blood Pressure (mmHg): 121/80 Reference Range: 80 - 120 mg / dl Electronic Signature(s) Signed: 06/13/2021 6:04:50 PM By: Gretta Cool, BSN, RN, CWS, Kim RN, BSN Entered By: Gretta Cool, BSN, RN, CWS, Kim on 06/13/2021 15:56:13

## 2021-06-13 NOTE — Progress Notes (Addendum)
Jaime [name] ([id number]) Visit Report for 06/13/2021 Chief Complaint Document Details Patient Name: Jaime [name], Jaime [name] Date of Service: 06/13/2021 3:45 PM Medical Record Number: [id number] Patient Account Number: [id number] Date of Birth/Sex: [birth year] (35 y.o. F) Treating RN: Donnamarie Poag Primary Care Provider: Stoney Bang Other Clinician: Referring Provider: Stoney Bang Treating Provider/Extender: Skipper Cliche in Treatment: 2 Information Obtained from: Patient Chief Complaint Gluteal and sacral pressure ulcers Electronic Signature(s) Signed: 06/13/2021 4:08:59 PM By: Worthy Keeler PA-C Entered By: Worthy Keeler on 06/13/2021 16:08:59 Jaime [name] ([id number]) -------------------------------------------------------------------------------- Debridement Details Patient Name: Jaime [name] Date of Service: 06/13/2021 3:45 PM Medical Record Number: [id number] Patient Account Number: [id number] Date of Birth/Sex: 1986/01/05 (35 y.o. F) Treating RN: Cornell Barman Primary Care Provider: Stoney Bang Other Clinician: Referring Provider: Stoney Bang Treating Provider/Extender: Skipper Cliche in Treatment: 2 Debridement Performed for Wound #2 Left,Proximal Gluteus Assessment: Performed By: Physician Tommie Sams., PA-C Debridement Type: Debridement Level of Consciousness (Pre- Awake and Alert procedure): Pre-procedure Verification/Time Out Yes - 16:19 Taken: Pain Control: Lidocaine Total Area Debrided (L x W): 0.6 (cm) x 0.4 (cm) = 0.24 (cm) Tissue and other material Viable, Non-Viable, Subcutaneous, Skin: Dermis , Skin: Epidermis debrided: Level: Skin/Subcutaneous Tissue Debridement Description: Excisional Instrument: Curette Bleeding: Minimum Hemostasis Achieved: Pressure Response to Treatment: Procedure was tolerated well Level of Consciousness (Post- Awake and Alert procedure): Post Debridement Measurements of Total Wound Length: (cm)  0.6 Stage: Category/Stage III Width: (cm) 0.4 Depth: (cm) 0.1 Volume: (cm) 0.019 Character of Wound/Ulcer Post Debridement: Stable Post Procedure Diagnosis Same as Pre-procedure Electronic Signature(s) Signed: 06/13/2021 5:54:54 PM By: Worthy Keeler PA-C Signed: 06/13/2021 6:04:50 PM By: Gretta Cool, BSN, RN, CWS, Kim RN, BSN Entered By: Gretta Cool, BSN, RN, CWS, Kim on 06/13/2021 16:19:48 Jaime [name] ([id number]) -------------------------------------------------------------------------------- Debridement Details Patient Name: Jaime [name] Date of Service: 06/13/2021 3:45 PM Medical Record Number: [id number] Patient Account Number: [id number] Date of Birth/Sex: September 09, 1986 (35 y.o. F) Treating RN: Cornell Barman Primary Care Provider: Stoney Bang Other Clinician: Referring Provider: Stoney Bang Treating Provider/Extender: Skipper Cliche in Treatment: 2 Debridement Performed for Wound #3 Left,Distal Gluteus Assessment: Performed By: Physician Tommie Sams., PA-C Debridement Type: Debridement Level of Consciousness (Pre- Awake and Alert procedure): Pre-procedure Verification/Time Out Yes - 16:19 Taken: Pain Control: Lidocaine Total Area Debrided (L x W): 0.8 (cm) x 0.5 (cm) = 0.4 (cm) Tissue and other material Viable, Non-Viable, Subcutaneous, Skin: Dermis , Skin: Epidermis debrided: Level: Skin/Subcutaneous Tissue Debridement Description: Excisional Instrument: Curette Bleeding: Minimum Hemostasis Achieved: Pressure Response to Treatment: Procedure was tolerated well Level of Consciousness (Post- Awake and Alert procedure): Post Debridement Measurements of Total Wound Length: (cm) 0.8 Stage: Category/Stage III Width: (cm) 0.6 Depth: (cm) 0.2 Volume: (cm) 0.075 Character of Wound/Ulcer Post Debridement: Stable Post Procedure Diagnosis Same as Pre-procedure Electronic Signature(s) Signed: 06/13/2021 5:54:54 PM By: Worthy Keeler PA-C Signed: 06/13/2021  6:04:50 PM By: Gretta Cool, BSN, RN, CWS, Kim RN, BSN Entered By: Gretta Cool, BSN, RN, CWS, Kim on 06/13/2021 16:20:31 Jaime [name], Jaime [name] ([id number]) -------------------------------------------------------------------------------- HPI Details Patient Name: Jaime [name] Date of Service: 06/13/2021 3:45 PM Medical Record Number: [id number] Patient Account Number: [id number] Date of Birth/Sex: 1986-02-16 (35 y.o. F) Treating RN: Donnamarie Poag Primary Care Provider: Stoney Bang Other Clinician: Referring Provider: Stoney Bang Treating Provider/Extender: Skipper Cliche in Treatment: 2 History of Present Illness HPI Description: 05/29/2021 upon evaluation today patient presents for initial evaluation here in our clinic concerning issues she has been having  with what appears to be pressure ulcerations on the gluteal and sacral region. She tells me that she does tend to sit a lot. With that being said it does appear that these are pressure ulcers to be honest. I do not see any evidence of infection but nonetheless I do believe that the patient is having some discomfort here. She has been placed on doxycycline she is also previously used Allevyn dressings but no specific wound care dressings have been utilized up to this point. These have been present for about a month she tells me. She does have diabetes with hemoglobin A1c of 10.3 she did see her endocrinologist yesterday she is try to work on that they got onto her she tells me. 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 She is on methadone looks like breakthrough oxycodone at this time. Nonetheless I believe that in general she seems to be doing pretty well but we do need to try and see what we can do to get these wounds healed for her as they are quite painful. 06/06/2021 upon evaluation today patient appears to be doing decently well in regard to the wounds in the  gluteal region. She continues to have some pain here. There is some slough noted on the to try to sharply debride this away to try to help with healing currently. Fortunately I do feel like she is making progress here however. Unfortunately she does have an issue on her knee where she fell and I do believe we will get a need to perform some debridement here to clear this away to allow to heal more effectively and quickly. 06/13/2021 upon evaluation today patient appears to be doing well with regard to her wounds. In fact the only wound that I see that still remains to be open or actually the 2 smaller areas on the left proximal and left just distal gluteus. Overall otherwise I feel like patient is doing quite well. There does not appear to be any signs of active infection at this time. No fevers, chills, nausea, vomiting, or diarrhea. Electronic Signature(s) Signed: 06/13/2021 5:30:31 PM By: Worthy Keeler PA-C Entered By: Worthy Keeler on 06/13/2021 17:30:31 Jaime [name], Jaime [name] ([id number]) -------------------------------------------------------------------------------- Physical Exam Details Patient Name: Jaime [name] Date of Service: 06/13/2021 3:45 PM Medical Record Number: [id number] Patient Account Number: [id number] Date of Birth/Sex: 05-Jul-1986 (35 y.o. F) Treating RN: Donnamarie Poag Primary Care Provider: Stoney Bang Other Clinician: Referring Provider: Stoney Bang Treating Provider/Extender: Skipper Cliche in Treatment: 2 Constitutional Well-nourished and well-hydrated in no acute distress. Respiratory normal breathing without difficulty. Psychiatric this patient is able to make decisions and demonstrates good insight into disease process. Alert and Oriented x 3. pleasant and cooperative. Notes Upon inspection patient's wound bed actually showed signs of good granulation and epithelization at this point. There does not appear to be any evidence of infection currently which  is great news and overall I am extremely pleased with where things stand at this point. I did perform sharp debridement of the left gluteal locations in order to help clear away the necrotic debris as well as some of the dry skin around the edges of the wound. She had minimal bleeding postdebridement wound bed appears to be doing significantly better which is great news. Electronic Signature(s) Signed: 06/13/2021 5:30:50 PM By: Worthy Keeler PA-C Entered By: Worthy Keeler on 06/13/2021 17:30:50  Jaime [name], Jaime [name] ([id number]) -------------------------------------------------------------------------------- Physician Orders Details Patient Name: Jaime [name] Date of Service: 06/13/2021 3:45 PM Medical Record Number: [id number] Patient Account Number: [id number] Date of Birth/Sex: December 06, 1985 (35 y.o. F) Treating RN: Cornell Barman Primary Care Provider: Stoney Bang Other Clinician: Referring Provider: Stoney Bang Treating Provider/Extender: Skipper Cliche in Treatment: 2 Verbal / Phone Orders: No Diagnosis Coding ICD-10 Coding Code Description [phone] Pressure ulcer of left buttock, stage 3 L89.153 Pressure ulcer of sacral region, stage 3 L97.812 Non-pressure chronic ulcer of other part of right lower leg with fat layer exposed E11.622 Type 2 diabetes mellitus with other skin ulcer J44.9 Chronic obstructive pulmonary disease, unspecified L93.0 Discoid lupus erythematosus Follow-up Appointments o Return Appointment in 1 week. Bathing/ Shower/ Hygiene o Wash wounds with antibacterial soap and water. o May shower; gently cleanse wound with antibacterial soap, rinse and pat dry prior to dressing wounds - change dressings immediately after bathing Off-Loading o Gel wheelchair cushion - suggested for where you sit Additional Orders / Instructions o Follow Nutritious Diet and Increase Protein Intake Wound Treatment Wound #1 - Coccyx Cleanser: Soap and Water Discharge  Instructions: Gently cleanse wound with antibacterial soap, rinse and pat dry prior to dressing wounds Primary Dressing: Prisma 4.34 (in) Discharge Instructions: Moisten w/normal saline or sterile water; Cover wound as directed. Do not remove from wound bed. Secondary Dressing: Coverlet Latex-Free Fabric Adhesive Dressings Discharge Instructions: 1.5 x 2 Wound #2 - Gluteus Wound Laterality: Left, Proximal Cleanser: Soap and Water 3 x Per Week/30 Days Discharge Instructions: Gently cleanse wound with antibacterial soap, rinse and pat dry prior to dressing wounds Primary Dressing: Prisma 4.34 (in) 3 x Per Week/30 Days Discharge Instructions: Moisten w/normal saline or sterile water; Cover wound as directed. Do not remove from wound bed. Secondary Dressing: Coverlet Latex-Free Fabric Adhesive Dressings 3 x Per Week/30 Days Discharge Instructions: 1.5 x 2 Wound #3 - Gluteus Wound Laterality: Left, Distal Cleanser: Soap and Water 3 x Per Week/30 Days Discharge Instructions: Gently cleanse wound with antibacterial soap, rinse and pat dry prior to dressing wounds Primary Dressing: Prisma 4.34 (in) 3 x Per Week/30 Days Discharge Instructions: Moisten w/normal saline or sterile water; Cover wound as directed. Do not remove from wound bed. Secondary Dressing: Coverlet Latex-Free Fabric Adhesive Dressings 3 x Per Week/30 Days Jaime [name], Jaime [name] ([id number]) Discharge Instructions: 1.5 x 2 Electronic Signature(s) Signed: 06/13/2021 5:54:54 PM By: Worthy Keeler PA-C Signed: 06/13/2021 6:04:50 PM By: Gretta Cool, BSN, RN, CWS, Kim RN, BSN Entered By: Gretta Cool, BSN, RN, CWS, Kim on 06/13/2021 16:21:30 Jaime [name], Jaime [name] ([id number]) -------------------------------------------------------------------------------- Problem List Details Patient Name: Jaime [name] Date of Service: 06/13/2021 3:45 PM Medical Record Number: [id number] Patient Account Number: [id number] Date of Birth/Sex: November 14, 1985 (35 y.o.  F) Treating RN: Donnamarie Poag Primary Care Provider: Stoney Bang Other Clinician: Referring Provider: Stoney Bang Treating Provider/Extender: Skipper Cliche in Treatment: 2 Active Problems ICD-10 Encounter Code Description Active Date MDM Diagnosis [phone] Pressure ulcer of left buttock, stage 3 05/29/2021 No Yes L89.153 Pressure ulcer of sacral region, stage 3 05/29/2021 No Yes L97.812 Non-pressure chronic ulcer of other part of right lower leg with fat layer 06/06/2021 No Yes exposed E11.622 Type 2 diabetes mellitus with other skin ulcer 05/29/2021 No Yes J44.9 Chronic obstructive pulmonary disease, unspecified 05/29/2021 No Yes L93.0 Discoid lupus erythematosus 05/29/2021 No Yes Inactive Problems Resolved Problems Electronic Signature(s) Signed: 06/13/2021 4:08:52 PM By: Worthy Keeler PA-C Entered By: Worthy Keeler on 06/13/2021 16:08:52 Jaime [name], Jaime D. (  [id number]) -------------------------------------------------------------------------------- Progress Note Details Patient Name: Jaime [name], Jaime [name] Date of Service: 06/13/2021 3:45 PM Medical Record Number: [id number] Patient Account Number: [id number] Date of Birth/Sex: 07/18/1986 (35 y.o. F) Treating RN: Donnamarie Poag Primary Care Provider: Stoney Bang Other Clinician: Referring Provider: Stoney Bang Treating Provider/Extender: Skipper Cliche in Treatment: 2 Subjective Chief Complaint Information obtained from Patient Gluteal and sacral pressure ulcers History of Present Illness (HPI) 05/29/2021 upon evaluation today patient presents for initial evaluation here in our clinic concerning issues she has been having with what appears to be pressure ulcerations on the gluteal and sacral region. She tells me that she does tend to sit a lot. With that being said it does appear that these are pressure ulcers to be honest. I do not see any evidence of infection but nonetheless I do believe that the patient is having  some discomfort here. She has been placed on doxycycline she is also previously used Allevyn dressings but no specific wound care dressings have been utilized up to this point. These have been present for about a month she tells me. She does have diabetes with hemoglobin A1c of 10.3 she did see her endocrinologist yesterday she is try to work on that they got onto her she tells me. 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 She is on methadone looks like breakthrough oxycodone at this time. Nonetheless I believe that in general she seems to be doing pretty well but we do need to try and see what we can do to get these wounds healed for her as they are quite painful. 06/06/2021 upon evaluation today patient appears to be doing decently well in regard to the wounds in the gluteal region. She continues to have some pain here. There is some slough noted on the to try to sharply debride this away to try to help with healing currently. Fortunately I do feel like she is making progress here however. Unfortunately she does have an issue on her knee where she fell and I do believe we will get a need to perform some debridement here to clear this away to allow to heal more effectively and quickly. 06/13/2021 upon evaluation today patient appears to be doing well with regard to her wounds. In fact the only wound that I see that still remains to be open or actually the 2 smaller areas on the left proximal and left just distal gluteus. Overall otherwise I feel like patient is doing quite well. There does not appear to be any signs of active infection at this time. No fevers, chills, nausea, vomiting, or diarrhea. Objective Constitutional Well-nourished and well-hydrated in no acute distress. Vitals Time Taken: 3:55 PM, Height: 70 in, Weight: 301 lbs, BMI: 43.2, Temperature: 98.2 F, Pulse: 101 bpm, Respiratory Rate: 18  breaths/min, Blood Pressure: 121/80 mmHg. Respiratory normal breathing without difficulty. Psychiatric this patient is able to make decisions and demonstrates good insight into disease process. Alert and Oriented x 3. pleasant and cooperative. General Notes: Upon inspection patient's wound bed actually showed signs of good granulation and epithelization at this point. There does not appear to be any evidence of infection currently which is great news and overall I am extremely pleased with where things stand at this point. I did perform sharp debridement of the left gluteal locations in order to help clear away the necrotic debris as well as some of the dry  skin around the edges of the wound. She had minimal bleeding postdebridement wound bed appears to be doing significantly better which is great news. Integumentary (Hair, Skin) Wound #1 status is Open. Original cause of wound was Pressure Injury. The date acquired was: 05/02/2021. The wound has been in treatment 2 weeks. The wound is located on the Coccyx. The wound measures 0.1cm length x 0.1cm width x 0.1cm depth; 0.008cm^2 area and 0.001cm^3 volume. There is a medium amount of serosanguineous drainage noted. Wound #2 status is Open. Original cause of wound was Gradually Appeared. The date acquired was: 05/02/2021. The wound has been in treatment 2 weeks. The wound is located on the Left,Proximal Gluteus. The wound measures 0.6cm length x 0.4cm width x 0.1cm depth; 0.188cm^2 area and 0.019cm^3 volume. There is a medium amount of serosanguineous drainage noted. Wound #3 status is Open. Original cause of wound was Gradually Appeared. The date acquired was: 05/02/2021. The wound has been in treatment Jaime [name], Jaime D. ([id number]) 2 weeks. The wound is located on the Left,Distal Gluteus. The wound measures 0.8cm length x 0.5cm width x 0.1cm depth; 0.314cm^2 area and 0.031cm^3 volume. There is a medium amount of serosanguineous drainage noted. Wound  #4 status is Healed - Epithelialized. Original cause of wound was Skin Tear/Laceration. The date acquired was: 05/23/2021. The wound has been in treatment 1 weeks. The wound is located on the Right Knee. The wound measures 0cm length x 0cm width x 0cm depth; 0cm^2 area and 0cm^3 volume. There is a none present amount of drainage noted. Assessment Active Problems ICD-10 Pressure ulcer of left buttock, stage 3 Pressure ulcer of sacral region, stage 3 Non-pressure chronic ulcer of other part of right lower leg with fat layer exposed Type 2 diabetes mellitus with other skin ulcer Chronic obstructive pulmonary disease, unspecified Discoid lupus erythematosus Procedures Wound #2 Pre-procedure diagnosis of Wound #2 is a Pressure Ulcer located on the Left,Proximal Gluteus . There was a Excisional Skin/Subcutaneous Tissue Debridement with a total area of 0.24 sq cm performed by Tommie Sams., PA-C. With the following instrument(s): Curette to remove Viable and Non-Viable tissue/material. Material removed includes Subcutaneous Tissue, Skin: Dermis, and Skin: Epidermis after achieving pain control using Lidocaine. No specimens were taken. A time out was conducted at 16:19, prior to the start of the procedure. A Minimum amount of bleeding was controlled with Pressure. The procedure was tolerated well. Post Debridement Measurements: 0.6cm length x 0.4cm width x 0.1cm depth; 0.019cm^3 volume. Post debridement Stage noted as Category/Stage III. Character of Wound/Ulcer Post Debridement is stable. Post procedure Diagnosis Wound #2: Same as Pre-Procedure Wound #3 Pre-procedure diagnosis of Wound #3 is a Pressure Ulcer located on the Left,Distal Gluteus . There was a Excisional Skin/Subcutaneous Tissue Debridement with a total area of 0.4 sq cm performed by Tommie Sams., PA-C. With the following instrument(s): Curette to remove Viable and Non-Viable tissue/material. Material removed includes Subcutaneous  Tissue, Skin: Dermis, and Skin: Epidermis after achieving pain control using Lidocaine. No specimens were taken. A time out was conducted at 16:19, prior to the start of the procedure. A Minimum amount of bleeding was controlled with Pressure. The procedure was tolerated well. Post Debridement Measurements: 0.8cm length x 0.6cm width x 0.2cm depth; 0.075cm^3 volume. Post debridement Stage noted as Category/Stage III. Character of Wound/Ulcer Post Debridement is stable. Post procedure Diagnosis Wound #3: Same as Pre-Procedure Plan Follow-up Appointments: Return Appointment in 1 week. Bathing/ Shower/ Hygiene: Wash wounds with antibacterial soap and water. May shower; gently  cleanse wound with antibacterial soap, rinse and pat dry prior to dressing wounds - change dressings immediately after bathing Off-Loading: Gel wheelchair cushion - suggested for where you sit Additional Orders / Instructions: Follow Nutritious Diet and Increase Protein Intake WOUND #1: - Coccyx Wound Laterality: Cleanser: Soap and Water Discharge Instructions: Gently cleanse wound with antibacterial soap, rinse and pat dry prior to dressing wounds Primary Dressing: Prisma 4.34 (in) Discharge Instructions: Moisten w/normal saline or sterile water; Cover wound as directed. Do not remove from wound bed. Secondary Dressing: Coverlet Latex-Free Fabric Adhesive Dressings Discharge Instructions: 1.5 x 2 WOUND #2: - Gluteus Wound Laterality: Left, Proximal Hobday, Lailynn D. ([id number]) Cleanser: Soap and Water 3 x Per Week/30 Days Discharge Instructions: Gently cleanse wound with antibacterial soap, rinse and pat dry prior to dressing wounds Primary Dressing: Prisma 4.34 (in) 3 x Per Week/30 Days Discharge Instructions: Moisten w/normal saline or sterile water; Cover wound as directed. Do not remove from wound bed. Secondary Dressing: Coverlet Latex-Free Fabric Adhesive Dressings 3 x Per Week/30 Days Discharge  Instructions: 1.5 x 2 WOUND #3: - Gluteus Wound Laterality: Left, Distal Cleanser: Soap and Water 3 x Per Week/30 Days Discharge Instructions: Gently cleanse wound with antibacterial soap, rinse and pat dry prior to dressing wounds Primary Dressing: Prisma 4.34 (in) 3 x Per Week/30 Days Discharge Instructions: Moisten w/normal saline or sterile water; Cover wound as directed. Do not remove from wound bed. Secondary Dressing: Coverlet Latex-Free Fabric Adhesive Dressings 3 x Per Week/30 Days Discharge Instructions: 1.5 x 2 1. Would recommend currently that we actually going to continue with wound care measures as before and the patient is in agreement with plan. This includes the use of the silver collagen which I think is doing a great job. 2. I am also can recommend that we have the patient continue to offload she is got her verification that she carries about whether and that is also at this point. I think she is doing everything she can to try to get this better. We will see patient back for reevaluation in 1 week here in the clinic. If anything worsens or changes patient will contact our office for additional recommendations. Electronic Signature(s) Signed: 06/13/2021 5:31:21 PM By: Worthy Keeler PA-C Entered By: Worthy Keeler on 06/13/2021 17:31:21 Jaime [name] ([id number]) -------------------------------------------------------------------------------- SuperBill Details Patient Name: Jaime [name] Date of Service: 06/13/2021 Medical Record Number: [id number] Patient Account Number: [id number] Date of Birth/Sex: September 02, 1986 (35 y.o. F) Treating RN: Donnamarie Poag Primary Care Provider: Stoney Bang Other Clinician: Referring Provider: Stoney Bang Treating Provider/Extender: Skipper Cliche in Treatment: 2 Diagnosis Coding ICD-10 Codes Code Description [phone] Pressure ulcer of left buttock, stage 3 L89.153 Pressure ulcer of sacral region, stage 3 L97.812 Non-pressure  chronic ulcer of other part of right lower leg with fat layer exposed E11.622 Type 2 diabetes mellitus with other skin ulcer J44.9 Chronic obstructive pulmonary disease, unspecified L93.0 Discoid lupus erythematosus Facility Procedures CPT4 Code: 14103013 Description: 11042 - DEB SUBQ TISSUE 20 SQ CM/< Modifier: Quantity: 1 CPT4 Code: Description: ICD-10 Diagnosis Description L89.323 Pressure ulcer of left buttock, stage 3 Modifier: Quantity: Physician Procedures CPT4 Code: 1438887 Description: 11042 - WC PHYS SUBQ TISS 20 SQ CM Modifier: Quantity: 1 CPT4 Code: Description: ICD-10 Diagnosis Description L89.323 Pressure ulcer of left buttock, stage 3 Modifier: Quantity: Electronic Signature(s) Signed: 06/13/2021 5:31:43 PM By: Worthy Keeler PA-C Entered By: Worthy Keeler on 06/13/2021 17:31:43

## 2021-06-23 ENCOUNTER — Encounter: Payer: Medicaid Other | Admitting: Physician Assistant

## 2021-06-23 ENCOUNTER — Other Ambulatory Visit: Payer: Self-pay

## 2021-06-23 DIAGNOSIS — E11622 Type 2 diabetes mellitus with other skin ulcer: Secondary | ICD-10-CM | POA: Diagnosis not present

## 2021-06-23 NOTE — Progress Notes (Signed)
DEEDEE, LYBARGER ([id number]) Visit Report for 06/23/2021 Arrival Information Details Patient Name: NAYDENE, KAMROWSKI Date of Service: 06/23/2021 9:00 AM Medical Record Number: [id number] Patient Account Number: [id number] Date of Birth/Sex: November 01, 1986 (35 y.o. F) Treating RN: Dolan Amen Primary Care Maribel Luis: Stoney Bang Other Clinician: Referring Airyanna Dipalma: Stoney Bang Treating Shateka Petrea/Extender: Skipper Cliche in Treatment: 3 Visit Information History Since Last Visit Has Dressing in Place as Prescribed: No Patient Arrived: Ambulatory Pain Present Now: Yes Arrival Time: 09:05 Accompanied By: self Transfer Assistance: None Patient Identification Verified: Yes Secondary Verification Process Completed: Yes Patient Requires Transmission-Based Precautions: No Patient Has Alerts: No Electronic Signature(s) Signed: 06/23/2021 4:26:23 PM By: Dolan Amen RN Entered By: Dolan Amen on 06/23/2021 09:05:25 [name] ([id number]) -------------------------------------------------------------------------------- Clinic Level of Care Assessment Details Patient Name: [name] Date of Service: 06/23/2021 9:00 AM Medical Record Number: [id number] Patient Account Number: [id number] Date of Birth/Sex: 1985-11-08 (35 y.o. F) Treating RN: Dolan Amen Primary Care Kaelen Caughlin: Stoney Bang Other Clinician: Referring Jailyne Chieffo: Stoney Bang Treating Tasheema Perrone/Extender: Skipper Cliche in Treatment: 3 Clinic Level of Care Assessment Items TOOL 4 Quantity Score X - Use when only an EandM is performed on FOLLOW-UP visit 1 0 ASSESSMENTS - Nursing Assessment / Reassessment X - Reassessment of Co-morbidities (includes updates in patient status) 1 10 X- 1 5 Reassessment of Adherence to Treatment Plan ASSESSMENTS - Wound and Skin Assessment / Reassessment X - Simple Wound Assessment / Reassessment - one wound 1 5 _0  - 0 Complex Wound Assessment / Reassessment - multiple  wounds _1  - 0 Dermatologic / Skin Assessment (not related to wound area) ASSESSMENTS - Focused Assessment _2  - Circumferential Edema Measurements - multi extremities 0 _3  - 0 Nutritional Assessment / Counseling / Intervention _4  - 0 Lower Extremity Assessment (monofilament, tuning fork, pulses) _5  - 0 Peripheral Arterial Disease Assessment (using hand held doppler) ASSESSMENTS - Ostomy and/or Continence Assessment and Care _6  - Incontinence Assessment and Management 0 _7  - 0 Ostomy Care Assessment and Management (repouching, etc.) PROCESS - Coordination of Care X - Simple Patient / Family Education for ongoing care 1 15 _8  - 0 Complex (extensive) Patient / Family Education for ongoing care _9  - 0 Staff obtains Programmer, systems, Records, Test Results / Process Orders _10  - 0 Staff telephones HHA, Nursing Homes / Clarify orders / etc _11  - 0 Routine Transfer to another Facility (non-emergent condition) _12  - 0 Routine Hospital Admission (non-emergent condition) _13  - 0 New Admissions / Biomedical engineer / Ordering NPWT, Apligraf, etc. _14  - 0 Emergency Hospital Admission (emergent condition) X- 1 10 Simple Discharge Coordination _15  - 0 Complex (extensive) Discharge Coordination PROCESS - Special Needs _16  - Pediatric / Minor Patient Management 0 _17  - 0 Isolation Patient Management _18  - 0 Hearing / Language / Visual special needs _19  - 0 Assessment of Community assistance (transportation, D/C planning, etc.) _20  - 0 Additional assistance / Altered mentation _21  - 0 Support Surface(s) Assessment (bed, cushion, seat, etc.) INTERVENTIONS - Wound Cleansing / Measurement Pratt, Edin D. ([id number]) X- 1 5 Simple Wound Cleansing - one wound _22  - 0 Complex Wound Cleansing - multiple wounds X- 1 5 Wound Imaging (photographs - any number of wounds) _23  - 0 Wound Tracing (instead of photographs) X- 1 5 Simple Wound Measurement - one wound _24  - 0 Complex Wound Measurement -  multiple wounds INTERVENTIONS - Wound Dressings _25  - Small Wound Dressing one or multiple wounds 0 X- 1 15 Medium Wound Dressing one or  multiple wounds _0  - 0 Large Wound Dressing one or multiple wounds <KYHCWCBJSEGBTDVV>_6<\/HYWVPXTGGYIRSWNI>_6  - 0 Application of Medications - topical <EVOJJKKXFGHWEXHB>_7<\/JIRCVELFYBOFBPZW>_2  - 0 Application of Medications - injection INTERVENTIONS - Miscellaneous _3  - External ear exam 0 _4  - 0 Specimen Collection (cultures, biopsies, blood, body fluids, etc.) _5  - 0 Specimen(s) / Culture(s) sent or taken to Lab for analysis _6  - 0 Patient Transfer (multiple staff / Harrel Lemon Lift / Similar devices) _7  - 0 Simple Staple / Suture removal (25 or less) _8  - 0 Complex Staple / Suture removal (26 or more) _9  - 0 Hypo / Hyperglycemic Management (close monitor of Blood Glucose) _10  - 0 Ankle / Brachial Index (ABI) - do not check if billed separately X- 1 5 Vital Signs Has the patient been seen at the hospital within the last three years: Yes Total Score: 80 Level Of Care: New/Established - Level 3 Electronic Signature(s) Signed: 06/23/2021 4:26:23 PM By: Dolan Amen RN Entered By: Dolan Amen on 06/23/2021 09:26:43 [name] ([id number]) -------------------------------------------------------------------------------- Encounter Discharge Information Details Patient Name: [name] Date of Service: 06/23/2021 9:00 AM Medical Record Number: [id number] Patient Account Number: [id number] Date of Birth/Sex: 01/07/1986 (35 y.o. F) Treating RN: Dolan Amen Primary Care Danaija Eskridge: Stoney Bang Other Clinician: Referring Merelin Human: Stoney Bang Treating Jonasia Coiner/Extender: Skipper Cliche in Treatment: 3 Encounter Discharge Information Items Discharge Condition: Stable Ambulatory Status: Ambulatory Discharge Destination: Home Transportation: Private Auto Accompanied By: self Schedule Follow-up Appointment: Yes Clinical Summary of Care: Electronic Signature(s) Signed: 06/23/2021 4:26:23 PM By: Dolan Amen RN Entered By: Dolan Amen on 06/23/2021 09:27:37 [name] ([id number]) -------------------------------------------------------------------------------- Lower Extremity Assessment Details Patient Name: [name] Date of Service: 06/23/2021 9:00 AM Medical Record Number: [id number] Patient Account Number: [id number] Date of Birth/Sex: 1986-01-26 (35 y.o. F) Treating RN: Dolan Amen Primary Care Francisca Harbuck: Stoney Bang Other Clinician: Referring Teagan Ozawa: Stoney Bang Treating Khaleef Ruby/Extender: Skipper Cliche in Treatment: 3 Electronic Signature(s) Signed: 06/23/2021 4:26:23 PM By: Dolan Amen RN Entered By: Dolan Amen on 06/23/2021 09:15:56 [name] ([id number]) -------------------------------------------------------------------------------- Multi Wound Chart Details Patient Name: [name] Date of Service: 06/23/2021 9:00 AM Medical Record Number: [id number] Patient Account Number: [id number] Date of Birth/Sex: [birth year] (35 y.o. F) Treating RN: Dolan Amen Primary Care Debra Calabretta: Stoney Bang Other Clinician: Referring Waniya Hoglund: Stoney Bang Treating Kaitlen Redford/Extender: Skipper Cliche in Treatment: 3 Vital Signs Height(in): 70 Pulse(bpm): 99 Weight(lbs): 301 Blood Pressure(mmHg): 132/84 Body Mass Index(BMI): 43 Temperature(F): 98.6 Respiratory Rate(breaths/min): 18 Photos: Wound Location: Coccyx Left, Proximal Gluteus Left, Distal Gluteus Wounding Event: Pressure Injury Gradually Appeared Gradually Appeared Primary Etiology: Pressure Ulcer Pressure Ulcer Pressure Ulcer Comorbid History: Chronic Obstructive Pulmonary Chronic Obstructive Pulmonary Chronic Obstructive Pulmonary Disease (COPD), Type II Diabetes, Disease (COPD), Type II Diabetes, Disease (COPD), Type II Diabetes, Seizure Disorder Seizure Disorder Seizure Disorder Date Acquired: 05/02/2021 05/02/2021 05/02/2021 Weeks of Treatment: _11 Wound Status:  Open Open Open Measurements L x W x D (cm) 0x0x0 0x0x0 0.3x0.3x0.1 Area (cm) : 0 0 0.071 Volume (cm) : 0 0 0.007 % Reduction in Area: 100.00% 100.00% 88.30% % Reduction in Volume: 100.00% 100.00% 88.30% Classification: Category/Stage III Category/Stage III Category/Stage III Exudate Amount: None Present None Present Medium Exudate Type: N/A N/A Sanguinous Exudate Color: N/A N/A red Granulation Amount: None Present (0%) None Present (0%) Large (67-100%) Granulation Quality: N/A N/A Red Necrotic Amount: None Present (0%) None Present (0%) None Present (0%) Exposed Structures: Fascia: No Fascia: No Fat Layer (  Subcutaneous Tissue): Fat Layer (Subcutaneous Tissue): Fat Layer (Subcutaneous Tissue): Yes No No Fascia: No Tendon: No Tendon: No Tendon: No Muscle: No Muscle: No Muscle: No Joint: No Joint: No Joint: No Bone: No Bone: No Bone: No Epithelialization: Large (67-100%) Large (67-100%) Large (67-100%) Treatment Notes Electronic Signature(s) Signed: 06/23/2021 4:26:23 PM By: Dolan Amen RN Entered By: Dolan Amen on 06/23/2021 09:22:33 [name] ([id number]) -------------------------------------------------------------------------------- Multi-Disciplinary Care Plan Details Patient Name: [name] Date of Service: 06/23/2021 9:00 AM Medical Record Number: [id number] Patient Account Number: [id number] Date of Birth/Sex: 10/03/1986 (35 y.o. F) Treating RN: Dolan Amen Primary Care Tykeisha Peer: Stoney Bang Other Clinician: Referring Jaquille Kau: Stoney Bang Treating Tessia Kassin/Extender: Skipper Cliche in Treatment: 3 Active Inactive Wound/Skin Impairment Nursing Diagnoses: Knowledge deficit related to ulceration/compromised skin integrity Goals: Patient/caregiver will verbalize understanding of skin care regimen Date Initiated: 05/29/2021 Date Inactivated: 06/06/2021 Target Resolution Date: 06/29/2021 Goal Status: Met Ulcer/skin breakdown will  have a volume reduction of 30% by week 4 Date Initiated: 05/29/2021 Date Inactivated: 06/23/2021 Target Resolution Date: 06/29/2021 Goal Status: Met Ulcer/skin breakdown will have a volume reduction of 50% by week 8 Date Initiated: 05/29/2021 Date Inactivated: 06/23/2021 Target Resolution Date: 07/30/2021 Goal Status: Met Ulcer/skin breakdown will have a volume reduction of 80% by week 12 Date Initiated: 05/29/2021 Date Inactivated: 06/23/2021 Target Resolution Date: 08/29/2021 Goal Status: Met Ulcer/skin breakdown will heal within 14 weeks Date Initiated: 05/29/2021 Target Resolution Date: 09/29/2021 Goal Status: Active Interventions: Assess patient/caregiver ability to obtain necessary supplies Assess patient/caregiver ability to perform ulcer/skin care regimen upon admission and as needed Assess ulceration(s) every visit Notes: Electronic Signature(s) Signed: 06/23/2021 4:26:23 PM By: Dolan Amen RN Entered By: Dolan Amen on 06/23/2021 09:21:39 [name] ([id number]) -------------------------------------------------------------------------------- Pain Assessment Details Patient Name: [name] Date of Service: 06/23/2021 9:00 AM Medical Record Number: [id number] Patient Account Number: [id number] Date of Birth/Sex: Mar 14, 1986 (35 y.o. F) Treating RN: Dolan Amen Primary Care Kiran Lapine: Stoney Bang Other Clinician: Referring Yaeko Fazekas: Stoney Bang Treating Denicia Pagliarulo/Extender: Skipper Cliche in Treatment: 3 Active Problems Location of Pain Severity and Description of Pain Patient Has Paino Yes Site Locations Pain Location: Pain in Ulcers Rate the pain. Current Pain Level: 8 Character of Pain Describe the Pain: Burning, Stabbing Pain Management and Medication Current Pain Management: Electronic Signature(s) Signed: 06/23/2021 4:26:23 PM By: Dolan Amen RN Entered By: Dolan Amen on 06/23/2021 09:07:25 [name]  ([id number]) -------------------------------------------------------------------------------- Patient/Caregiver Education Details Patient Name: [name] Date of Service: 06/23/2021 9:00 AM Medical Record Number: [id number] Patient Account Number: [id number] Date of Birth/Gender: 10-18-1986 (35 y.o. F) Treating RN: Dolan Amen Primary Care Physician: Stoney Bang Other Clinician: Referring Physician: Stoney Bang Treating Physician/Extender: Skipper Cliche in Treatment: 3 Education Assessment Education Provided To: Patient Education Topics Provided Wound/Skin Impairment: Methods: Explain/Verbal Responses: State content correctly Electronic Signature(s) Signed: 06/23/2021 4:26:23 PM By: Dolan Amen RN Entered By: Dolan Amen on 06/23/2021 09:26:57 [name] ([id number]) -------------------------------------------------------------------------------- Wound Assessment Details Patient Name: [name] Date of Service: 06/23/2021 9:00 AM Medical Record Number: [id number] Patient Account Number: [id number] Date of Birth/Sex: June 02, 1986 (35 y.o. F) Treating RN: Dolan Amen Primary Care Adiana Smelcer: Stoney Bang Other Clinician: Referring Ashritha Desrosiers: Stoney Bang Treating Nilsa Macht/Extender: Skipper Cliche in Treatment: 3 Wound Status Wound Number: 1 Primary Pressure Ulcer Etiology: Wound Location: Coccyx Wound Status: Open Wounding Event: Pressure Injury Comorbid Chronic Obstructive Pulmonary Disease (COPD), Type II Date Acquired: 05/02/2021 History: Diabetes,  Seizure Disorder Weeks Of Treatment: 3 Clustered Wound: No Photos Wound Measurements Length: (cm) 0 Width: (cm) 0 Depth: (cm) 0 Area: (cm) 0 Volume: (cm) 0 % Reduction in Area: 100% % Reduction in Volume: 100% Epithelialization: Large (67-100%) Tunneling: No Undermining: No Wound Description Classification: Category/Stage III Exudate Amount: None Present Foul Odor After  Cleansing: No Slough/Fibrino No Wound Bed Granulation Amount: None Present (0%) Exposed Structure Necrotic Amount: None Present (0%) Fascia Exposed: No Fat Layer (Subcutaneous Tissue) Exposed: No Tendon Exposed: No Muscle Exposed: No Joint Exposed: No Bone Exposed: No Electronic Signature(s) Signed: 06/23/2021 4:26:23 PM By: Dolan Amen RN Entered By: Dolan Amen on 06/23/2021 09:14:42 [name] ([id number]) -------------------------------------------------------------------------------- Wound Assessment Details Patient Name: [name] Date of Service: 06/23/2021 9:00 AM Medical Record Number: [id number] Patient Account Number: [id number] Date of Birth/Sex: Mar 13, 1986 (35 y.o. F) Treating RN: Dolan Amen Primary Care Quy Lotts: Stoney Bang Other Clinician: Referring Ajayla Iglesias: Stoney Bang Treating Josue Falconi/Extender: Skipper Cliche in Treatment: 3 Wound Status Wound Number: 2 Primary Pressure Ulcer Etiology: Wound Location: Left, Proximal Gluteus Wound Status: Open Wounding Event: Gradually Appeared Comorbid Chronic Obstructive Pulmonary Disease (COPD), Type II Date Acquired: 05/02/2021 History: Diabetes, Seizure Disorder Weeks Of Treatment: 3 Clustered Wound: No Photos Wound Measurements Length: (cm) 0 Width: (cm) 0 Depth: (cm) 0 Area: (cm) 0 Volume: (cm) 0 % Reduction in Area: 100% % Reduction in Volume: 100% Epithelialization: Large (67-100%) Tunneling: No Undermining: No Wound Description Classification: Category/Stage III Exudate Amount: None Present Foul Odor After Cleansing: No Slough/Fibrino No Wound Bed Granulation Amount: None Present (0%) Exposed Structure Necrotic Amount: None Present (0%) Fascia Exposed: No Fat Layer (Subcutaneous Tissue) Exposed: No Tendon Exposed: No Muscle Exposed: No Joint Exposed: No Bone Exposed: No Electronic Signature(s) Signed: 06/23/2021 4:26:23 PM By: Dolan Amen RN Entered By:  Dolan Amen on 06/23/2021 09:15:11 [name] ([id number]) -------------------------------------------------------------------------------- Wound Assessment Details Patient Name: [name] Date of Service: 06/23/2021 9:00 AM Medical Record Number: [id number] Patient Account Number: [id number] Date of Birth/Sex: 1986/10/20 (35 y.o. F) Treating RN: Dolan Amen Primary Care Tawnee Clegg: Stoney Bang Other Clinician: Referring Dedric Ethington: Stoney Bang Treating Foy Mungia/Extender: Skipper Cliche in Treatment: 3 Wound Status Wound Number: 3 Primary Pressure Ulcer Etiology: Wound Location: Left, Distal Gluteus Wound Status: Open Wounding Event: Gradually Appeared Comorbid Chronic Obstructive Pulmonary Disease (COPD), Type II Date Acquired: 05/02/2021 History: Diabetes, Seizure Disorder Weeks Of Treatment: 3 Clustered Wound: No Photos Wound Measurements Length: (cm) 0.3 Width: (cm) 0.3 Depth: (cm) 0.1 Area: (cm) 0.071 Volume: (cm) 0.007 % Reduction in Area: 88.3% % Reduction in Volume: 88.3% Epithelialization: Large (67-100%) Tunneling: No Undermining: No Wound Description Classification: Category/Stage III Exudate Amount: Medium Exudate Type: Sanguinous Exudate Color: red Foul Odor After Cleansing: No Slough/Fibrino No Wound Bed Granulation Amount: Large (67-100%) Exposed Structure Granulation Quality: Red Fascia Exposed: No Necrotic Amount: None Present (0%) Fat Layer (Subcutaneous Tissue) Exposed: Yes Tendon Exposed: No Muscle Exposed: No Joint Exposed: No Bone Exposed: No Treatment Notes Wound #3 (Gluteus) Wound Laterality: Left, Distal Cleanser Soap and Water Discharge Instruction: Gently cleanse wound with antibacterial soap, rinse and pat dry prior to dressing wounds Peri-Wound Care LAURENCIA, ROMA. ([id number]) Topical Primary Dressing Prisma 4.34 (in) Discharge Instruction: Moisten w/normal saline or sterile water; Cover wound as  directed. Do not remove from wound bed. Secondary Dressing Coverlet Latex-Free Fabric Adhesive Dressings Discharge Instruction: 1.5 x 2 Secured With Compression Wrap Compression Stockings Add-Ons Electronic Signature(s) Signed: 06/23/2021 4:26:23 PM By:  Dolan Amen RN Entered By: Dolan Amen on 06/23/2021 09:15:45 [name] ([id number]) -------------------------------------------------------------------------------- Vitals Details Patient Name: [name] Date of Service: 06/23/2021 9:00 AM Medical Record Number: [id number] Patient Account Number: [id number] Date of Birth/Sex: 14-Jul-1986 (35 y.o. F) Treating RN: Dolan Amen Primary Care Matylda Fehring: Stoney Bang Other Clinician: Referring Jaire Pinkham: Stoney Bang Treating Damaya Channing/Extender: Skipper Cliche in Treatment: 3 Vital Signs Time Taken: 09:05 Temperature (F): 98.6 Height (in): 70 Pulse (bpm): 99 Weight (lbs): 301 Respiratory Rate (breaths/min): 18 Body Mass Index (BMI): 43.2 Blood Pressure (mmHg): 132/84 Reference Range: 80 - 120 mg / dl Electronic Signature(s) Signed: 06/23/2021 4:26:23 PM By: Dolan Amen RN Entered By: Dolan Amen on 06/23/2021 09:06:58

## 2021-06-23 NOTE — Progress Notes (Addendum)
Jaime [name] ([id number]) Visit Report for 06/23/2021 Chief Complaint Document Details Patient Name: Jaime [name], Jaime [name] Date of Service: 06/23/2021 9:00 AM Medical Record Number: [id number] Patient Account Number: [id number] Date of Birth/Sex: 1986/10/08 (35 y.o. F) Treating RN: Dolan Amen Primary Care Provider: Stoney Bang Other Clinician: Referring Provider: Stoney Bang Treating Provider/Extender: Skipper Cliche in Treatment: 3 Information Obtained from: Patient Chief Complaint Gluteal and sacral pressure ulcers Electronic Signature(s) Signed: 06/23/2021 9:12:27 AM By: Worthy Keeler PA-C Entered By: Worthy Keeler on 06/23/2021 09:12:26 Jaime [name] ([id number]) -------------------------------------------------------------------------------- HPI Details Patient Name: Jaime [name] Date of Service: 06/23/2021 9:00 AM Medical Record Number: [id number] Patient Account Number: [id number] Date of Birth/Sex: 12/27/1985 (35 y.o. F) Treating RN: Dolan Amen Primary Care Provider: Stoney Bang Other Clinician: Referring Provider: Stoney Bang Treating Provider/Extender: Skipper Cliche in Treatment: 3 History of Present Illness HPI Description: 05/29/2021 upon evaluation today patient presents for initial evaluation here in our clinic concerning issues she has been having with what appears to be pressure ulcerations on the gluteal and sacral region. She tells me that she does tend to sit a lot. With that being said it does appear that these are pressure ulcers to be honest. I do not see any evidence of infection but nonetheless I do believe that the patient is having some discomfort here. She has been placed on doxycycline she is also previously used Allevyn dressings but no specific wound care dressings have been utilized up to this point. These have been present for about a month she tells me. She does have diabetes with hemoglobin A1c of 10.3 she did see her  endocrinologist yesterday she is try to work on that they got onto her she tells me. 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 She is on methadone looks like breakthrough oxycodone at this time. Nonetheless I believe that in general she seems to be doing pretty well but we do need to try and see what we can do to get these wounds healed for her as they are quite painful. 06/06/2021 upon evaluation today patient appears to be doing decently well in regard to the wounds in the gluteal region. She continues to have some pain here. There is some slough noted on the to try to sharply debride this away to try to help with healing currently. Fortunately I do feel like she is making progress here however. Unfortunately she does have an issue on her knee where she fell and I do believe we will get a need to perform some debridement here to clear this away to allow to heal more effectively and quickly. 06/13/2021 upon evaluation today patient appears to be doing well with regard to her wounds. In fact the only wound that I see that still remains to be open or actually the 2 smaller areas on the left proximal and left just distal gluteus. Overall otherwise I feel like patient is doing quite well. There does not appear to be any signs of active infection at this time. No fevers, chills, nausea, vomiting, or diarrhea. 06/23/2021 upon evaluation today patient appears to be doing quite well with regard to her wounds. In fact she is healing quite nicely and seems to be showing signs of excellent improvement. Fortunately there does not appear to be any evidence of active infection at this time which is great news. No fevers, chills, nausea,  vomiting, or diarrhea. Electronic Signature(s) Signed: 06/23/2021 9:48:19 AM By: Worthy Keeler PA-C Entered By: Worthy Keeler on 06/23/2021 09:48:19 Jaime [name]  ([id number]) -------------------------------------------------------------------------------- Physical Exam Details Patient Name: Jaime [name] Date of Service: 06/23/2021 9:00 AM Medical Record Number: [id number] Patient Account Number: [id number] Date of Birth/Sex: 03/23/1986 (35 y.o. F) Treating RN: Dolan Amen Primary Care Provider: Stoney Bang Other Clinician: Referring Provider: Stoney Bang Treating Provider/Extender: Skipper Cliche in Treatment: 3 Constitutional Well-nourished and well-hydrated in no acute distress. Respiratory normal breathing without difficulty. Psychiatric this patient is able to make decisions and demonstrates good insight into disease process. Alert and Oriented x 3. pleasant and cooperative. Notes Upon inspection patient's wound did not require any sharp debridement and overall seems to be doing quite well. I am extremely pleased with where we stand today. There is no sign of active infection at this time. Electronic Signature(s) Signed: 06/23/2021 9:48:32 AM By: Worthy Keeler PA-C Entered By: Worthy Keeler on 06/23/2021 09:48:32 Jaime [name] ([id number]) -------------------------------------------------------------------------------- Physician Orders Details Patient Name: Jaime [name] Date of Service: 06/23/2021 9:00 AM Medical Record Number: [id number] Patient Account Number: [id number] Date of Birth/Sex: 1986-01-10 (35 y.o. F) Treating RN: Dolan Amen Primary Care Provider: Stoney Bang Other Clinician: Referring Provider: Stoney Bang Treating Provider/Extender: Skipper Cliche in Treatment: 3 Verbal / Phone Orders: No Diagnosis Coding ICD-10 Coding Code Description [phone] Pressure ulcer of left buttock, stage 3 L89.153 Pressure ulcer of sacral region, stage 3 L97.812 Non-pressure chronic ulcer of other part of right lower leg with fat layer exposed E11.622 Type 2 diabetes mellitus with other skin ulcer J44.9  Chronic obstructive pulmonary disease, unspecified L93.0 Discoid lupus erythematosus Follow-up Appointments o Return Appointment in 1 week. Bathing/ Shower/ Hygiene o Wash wounds with antibacterial soap and water. o May shower; gently cleanse wound with antibacterial soap, rinse and pat dry prior to dressing wounds - change dressings immediately after bathing Off-Loading o Gel wheelchair cushion - suggested for where you sit Additional Orders / Instructions o Follow Nutritious Diet and Increase Protein Intake Wound Treatment Wound #3 - Gluteus Wound Laterality: Left, Distal Cleanser: Soap and Water 3 x Per Week/30 Days Discharge Instructions: Gently cleanse wound with antibacterial soap, rinse and pat dry prior to dressing wounds Primary Dressing: Prisma 4.34 (in) 3 x Per Week/30 Days Discharge Instructions: Moisten w/normal saline or sterile water; Cover wound as directed. Do not remove from wound bed. Secondary Dressing: Coverlet Latex-Free Fabric Adhesive Dressings 3 x Per Week/30 Days Discharge Instructions: 1.5 x 2 Electronic Signature(s) Signed: 06/23/2021 4:26:23 PM By: Dolan Amen RN Signed: 06/23/2021 5:15:29 PM By: Worthy Keeler PA-C Entered By: Dolan Amen on 06/23/2021 09:26:20 Jaime [name], Jaime [name] ([id number]) -------------------------------------------------------------------------------- Problem List Details Patient Name: Jaime [name] Date of Service: 06/23/2021 9:00 AM Medical Record Number: [id number] Patient Account Number: [id number] Date of Birth/Sex: [birth year] (35 y.o. F) Treating RN: Dolan Amen Primary Care Provider: Stoney Bang Other Clinician: Referring Provider: Stoney Bang Treating Provider/Extender: Skipper Cliche in Treatment: 3 Active Problems ICD-10 Encounter Code Description Active Date MDM Diagnosis [phone] Pressure ulcer of left buttock, stage 3 05/29/2021 No Yes L89.153 Pressure ulcer of sacral region, stage 3  05/29/2021 No Yes L97.812 Non-pressure chronic ulcer of other part of right lower leg with fat layer 06/06/2021 No Yes exposed E11.622 Type 2 diabetes mellitus with other skin ulcer 05/29/2021 No Yes J44.9 Chronic obstructive pulmonary disease, unspecified 05/29/2021 No Yes L93.0 Discoid lupus erythematosus 05/29/2021 No  Yes Inactive Problems Resolved Problems Electronic Signature(s) Signed: 06/23/2021 9:03:19 AM By: Worthy Keeler PA-C Entered By: Worthy Keeler on 06/23/2021 09:03:19 Jaime [name], Jaime [name] ([id number]) -------------------------------------------------------------------------------- Progress Note Details Patient Name: Jaime [name] Date of Service: 06/23/2021 9:00 AM Medical Record Number: [id number] Patient Account Number: [id number] Date of Birth/Sex: August 14, 1986 (35 y.o. F) Treating RN: Dolan Amen Primary Care Provider: Stoney Bang Other Clinician: Referring Provider: Stoney Bang Treating Provider/Extender: Skipper Cliche in Treatment: 3 Subjective Chief Complaint Information obtained from Patient Gluteal and sacral pressure ulcers History of Present Illness (HPI) 05/29/2021 upon evaluation today patient presents for initial evaluation here in our clinic concerning issues she has been having with what appears to be pressure ulcerations on the gluteal and sacral region. She tells me that she does tend to sit a lot. With that being said it does appear that these are pressure ulcers to be honest. I do not see any evidence of infection but nonetheless I do believe that the patient is having some discomfort here. She has been placed on doxycycline she is also previously used Allevyn dressings but no specific wound care dressings have been utilized up to this point. These have been present for about a month she tells me. She does have diabetes with hemoglobin A1c of 10.3 she did see her endocrinologist yesterday she is try to work on that they got onto her she tells  me. 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 She is on methadone looks like breakthrough oxycodone at this time. Nonetheless I believe that in general she seems to be doing pretty well but we do need to try and see what we can do to get these wounds healed for her as they are quite painful. 06/06/2021 upon evaluation today patient appears to be doing decently well in regard to the wounds in the gluteal region. She continues to have some pain here. There is some slough noted on the to try to sharply debride this away to try to help with healing currently. Fortunately I do feel like she is making progress here however. Unfortunately she does have an issue on her knee where she fell and I do believe we will get a need to perform some debridement here to clear this away to allow to heal more effectively and quickly. 06/13/2021 upon evaluation today patient appears to be doing well with regard to her wounds. In fact the only wound that I see that still remains to be open or actually the 2 smaller areas on the left proximal and left just distal gluteus. Overall otherwise I feel like patient is doing quite well. There does not appear to be any signs of active infection at this time. No fevers, chills, nausea, vomiting, or diarrhea. 06/23/2021 upon evaluation today patient appears to be doing quite well with regard to her wounds. In fact she is healing quite nicely and seems to be showing signs of excellent improvement. Fortunately there does not appear to be any evidence of active infection at this time which is great news. No fevers, chills, nausea, vomiting, or diarrhea. Objective Constitutional Well-nourished and well-hydrated in no acute distress. Vitals Time Taken: 9:05 AM, Height: 70 in, Weight: 301 lbs, BMI: 43.2, Temperature: 98.6 F, Pulse: 99 bpm, Respiratory Rate: 18 breaths/min, Blood Pressure: 132/84  mmHg. Respiratory normal breathing without difficulty. Psychiatric this patient is able to make decisions and demonstrates  good insight into disease process. Alert and Oriented x 3. pleasant and cooperative. General Notes: Upon inspection patient's wound did not require any sharp debridement and overall seems to be doing quite well. I am extremely pleased with where we stand today. There is no sign of active infection at this time. Integumentary (Hair, Skin) Wound #1 status is Open. Original cause of wound was Pressure Injury. The date acquired was: 05/02/2021. The wound has been in treatment 3 weeks. The wound is located on the Coccyx. The wound measures 0cm length x 0cm width x 0cm depth; 0cm^2 area and 0cm^3 volume. There is no tunneling or undermining noted. There is a none present amount of drainage noted. There is no granulation within the wound bed. There is no necrotic tissue within the wound bed. Wound #2 status is Open. Original cause of wound was Gradually Appeared. The date acquired was: 05/02/2021. The wound has been in treatment 3 weeks. The wound is located on the Left,Proximal Gluteus. The wound measures 0cm length x 0cm width x 0cm depth; 0cm^2 area and 0cm^3 volume. There is no tunneling or undermining noted. There is a none present amount of drainage noted. There is no granulation within the wound Jaime [name], Jaime D. ([id number]) bed. There is no necrotic tissue within the wound bed. Wound #3 status is Open. Original cause of wound was Gradually Appeared. The date acquired was: 05/02/2021. The wound has been in treatment 3 weeks. The wound is located on the Left,Distal Gluteus. The wound measures 0.3cm length x 0.3cm width x 0.1cm depth; 0.071cm^2 area and 0.007cm^3 volume. There is Fat Layer (Subcutaneous Tissue) exposed. There is no tunneling or undermining noted. There is a medium amount of sanguinous drainage noted. There is large (67-100%) red granulation within the wound bed.  There is no necrotic tissue within the wound bed. Assessment Active Problems ICD-10 Pressure ulcer of left buttock, stage 3 Pressure ulcer of sacral region, stage 3 Non-pressure chronic ulcer of other part of right lower leg with fat layer exposed Type 2 diabetes mellitus with other skin ulcer Chronic obstructive pulmonary disease, unspecified Discoid lupus erythematosus Plan Follow-up Appointments: Return Appointment in 1 week. Bathing/ Shower/ Hygiene: Wash wounds with antibacterial soap and water. May shower; gently cleanse wound with antibacterial soap, rinse and pat dry prior to dressing wounds - change dressings immediately after bathing Off-Loading: Gel wheelchair cushion - suggested for where you sit Additional Orders / Instructions: Follow Nutritious Diet and Increase Protein Intake WOUND #3: - Gluteus Wound Laterality: Left, Distal Cleanser: Soap and Water 3 x Per Week/30 Days Discharge Instructions: Gently cleanse wound with antibacterial soap, rinse and pat dry prior to dressing wounds Primary Dressing: Prisma 4.34 (in) 3 x Per Week/30 Days Discharge Instructions: Moisten w/normal saline or sterile water; Cover wound as directed. Do not remove from wound bed. Secondary Dressing: Coverlet Latex-Free Fabric Adhesive Dressings 3 x Per Week/30 Days Discharge Instructions: 1.5 x 2 1. Would recommend currently that we going continue with the wound care measures as before and the patient is in agreement with the plan. This includes the use of the silver collagen. 2. I am also going to recommend a cover dressing over this to secure her mother is helping with taking care of this. We will see patient back for reevaluation in 1 week here in the clinic. If anything worsens or changes patient will contact our office for additional recommendations. Electronic Signature(s) Signed: 06/23/2021 10:12:06 AM By: Worthy Keeler PA-C Previous Signature: 06/23/2021 10:11:55 AM Version  By:  Worthy Keeler PA-C Entered By: Worthy Keeler on 06/23/2021 10:12:06 Jaime [name] ([id number]) -------------------------------------------------------------------------------- SuperBill Details Patient Name: Jaime [name] Date of Service: 06/23/2021 Medical Record Number: [id number] Patient Account Number: [id number] Date of Birth/Sex: 1985/12/06 (35 y.o. F) Treating RN: Dolan Amen Primary Care Provider: Stoney Bang Other Clinician: Referring Provider: Stoney Bang Treating Provider/Extender: Skipper Cliche in Treatment: 3 Diagnosis Coding ICD-10 Codes Code Description [phone] Pressure ulcer of left buttock, stage 3 L89.153 Pressure ulcer of sacral region, stage 3 L97.812 Non-pressure chronic ulcer of other part of right lower leg with fat layer exposed E11.622 Type 2 diabetes mellitus with other skin ulcer J44.9 Chronic obstructive pulmonary disease, unspecified L93.0 Discoid lupus erythematosus Facility Procedures CPT4 Code: [id number] Description: 99213 - WOUND CARE VISIT-LEV 3 EST PT Modifier: Quantity: 1 Physician Procedures CPT4 Code: 0814481 Description: 85631 - WC PHYS LEVEL 3 - EST PT Modifier: Quantity: 1 CPT4 Code: Description: ICD-10 Diagnosis Description L89.323 Pressure ulcer of left buttock, stage 3 L89.153 Pressure ulcer of sacral region, stage 3 L97.812 Non-pressure chronic ulcer of other part of right lower leg with fat la E11.622 Type 2 diabetes mellitus  with other skin ulcer Modifier: yer exposed Quantity: Electronic Signature(s) Signed: 06/23/2021 10:12:32 AM By: Worthy Keeler PA-C Entered By: Worthy Keeler on 06/23/2021 10:12:32

## 2021-06-30 ENCOUNTER — Other Ambulatory Visit: Payer: Self-pay

## 2021-06-30 ENCOUNTER — Encounter: Payer: Medicaid Other | Admitting: Physician Assistant

## 2021-06-30 ENCOUNTER — Other Ambulatory Visit: Payer: Self-pay | Admitting: "Endocrinology

## 2021-06-30 ENCOUNTER — Other Ambulatory Visit: Payer: Self-pay | Admitting: Nurse Practitioner

## 2021-06-30 DIAGNOSIS — E11622 Type 2 diabetes mellitus with other skin ulcer: Secondary | ICD-10-CM | POA: Diagnosis not present

## 2021-06-30 NOTE — Progress Notes (Addendum)
Jaime [name], Jaime [name] ([id number]) Visit Report for 06/30/2021 Chief Complaint Document Details Patient Name: Jaime [name], Jaime [name] Date of Service: 06/30/2021 10:00 AM Medical Record Number: [id number] Patient Account Number: [id number] Date of Birth/Sex: 16-Dec-1985 (35 y.o. F) Treating RN: Dolan Amen Primary Care Provider: Stoney Bang Other Clinician: Referring Provider: Stoney Bang Treating Provider/Extender: Skipper Cliche in Treatment: 4 Information Obtained from: Patient Chief Complaint Gluteal and sacral pressure ulcers Electronic Signature(s) Signed: 06/30/2021 9:02:17 AM By: Worthy Keeler PA-C Entered By: Worthy Keeler on 06/30/2021 09:02:17 Jaime [name] ([id number]) -------------------------------------------------------------------------------- HPI Details Patient Name: Jaime [name] Date of Service: 06/30/2021 10:00 AM Medical Record Number: [id number] Patient Account Number: [id number] Date of Birth/Sex: 07-04-1986 (35 y.o. F) Treating RN: Dolan Amen Primary Care Provider: Stoney Bang Other Clinician: Referring Provider: Stoney Bang Treating Provider/Extender: Skipper Cliche in Treatment: 4 History of Present Illness HPI Description: 05/29/2021 upon evaluation today patient presents for initial evaluation here in our clinic concerning issues she has been having with what appears to be pressure ulcerations on the gluteal and sacral region. She tells me that she does tend to sit a lot. With that being said it does appear that these are pressure ulcers to be honest. I do not see any evidence of infection but nonetheless I do believe that the patient is having some discomfort here. She has been placed on doxycycline she is also previously used Allevyn dressings but no specific wound care dressings have been utilized up to this point. These have been present for about a month she tells me. She does have diabetes with hemoglobin A1c of 10.3 she did see her  endocrinologist yesterday she is try to work on that they got onto her she tells me. 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 She is on methadone looks like breakthrough oxycodone at this time. Nonetheless I believe that in general she seems to be doing pretty well but we do need to try and see what we can do to get these wounds healed for her as they are quite painful. 06/06/2021 upon evaluation today patient appears to be doing decently well in regard to the wounds in the gluteal region. She continues to have some pain here. There is some slough noted on the to try to sharply debride this away to try to help with healing currently. Fortunately I do feel like she is making progress here however. Unfortunately she does have an issue on her knee where she fell and I do believe we will get a need to perform some debridement here to clear this away to allow to heal more effectively and quickly. 06/13/2021 upon evaluation today patient appears to be doing well with regard to her wounds. In fact the only wound that I see that still remains to be open or actually the 2 smaller areas on the left proximal and left just distal gluteus. Overall otherwise I feel like patient is doing quite well. There does not appear to be any signs of active infection at this time. No fevers, chills, nausea, vomiting, or diarrhea. 06/23/2021 upon evaluation today patient appears to be doing quite well with regard to her wounds. In fact she is healing quite nicely and seems to be showing signs of excellent improvement. Fortunately there does not appear to be any evidence of active infection at this time which is great news. No fevers, chills, nausea,  vomiting, or diarrhea. 06/30/2021 upon evaluation today patient actually appears to be doing excellent. In fact she is completely healed. Fortunately there does not appear to be any signs of active  infection at this time which is great news. Electronic Signature(s) Signed: 06/30/2021 9:52:45 AM By: Worthy Keeler PA-C Entered By: Worthy Keeler on 06/30/2021 09:52:45 Jaime [name] ([id number]) -------------------------------------------------------------------------------- Physical Exam Details Patient Name: Jaime [name] Date of Service: 06/30/2021 10:00 AM Medical Record Number: [id number] Patient Account Number: [id number] Date of Birth/Sex: 1986-06-11 (35 y.o. F) Treating RN: Dolan Amen Primary Care Provider: Stoney Bang Other Clinician: Referring Provider: Stoney Bang Treating Provider/Extender: Skipper Cliche in Treatment: 4 Constitutional Chronically ill appearing but in no apparent acute distress. Respiratory normal breathing without difficulty. Psychiatric this patient is able to make decisions and demonstrates good insight into disease process. Alert and Oriented x 3. pleasant and cooperative. Notes Upon inspection patient showed signs of complete epithelization which is great news. Electronic Signature(s) Signed: 06/30/2021 9:53:21 AM By: Worthy Keeler PA-C Entered By: Worthy Keeler on 06/30/2021 09:53:20 Jaime [name] ([id number]) -------------------------------------------------------------------------------- Physician Orders Details Patient Name: Jaime [name] Date of Service: 06/30/2021 10:00 AM Medical Record Number: [id number] Patient Account Number: [id number] Date of Birth/Sex: 1986-08-27 (35 y.o. F) Treating RN: Carlene Coria Primary Care Provider: Stoney Bang Other Clinician: Referring Provider: Stoney Bang Treating Provider/Extender: Skipper Cliche in Treatment: 4 Verbal / Phone Orders: No Diagnosis Coding ICD-10 Coding Code Description [phone] Pressure ulcer of left buttock, stage 3 L89.153 Pressure ulcer of sacral region, stage 3 L97.812 Non-pressure chronic ulcer of other part of right lower leg with fat layer  exposed E11.622 Type 2 diabetes mellitus with other skin ulcer J44.9 Chronic obstructive pulmonary disease, unspecified L93.0 Discoid lupus erythematosus Discharge From [facility] Services o Discharge from [facility] Complete Electronic Signature(s) Signed: 06/30/2021 1:47:40 PM By: Carlene Coria RN Signed: 06/30/2021 5:05:16 PM By: Worthy Keeler PA-C Entered By: Carlene Coria on 06/30/2021 09:47:35 Jaime [name] ([id number]) -------------------------------------------------------------------------------- Problem List Details Patient Name: Jaime [name] Date of Service: 06/30/2021 10:00 AM Medical Record Number: [id number] Patient Account Number: [id number] Date of Birth/Sex: [birth year] (35 y.o. F) Treating RN: Dolan Amen Primary Care Provider: Stoney Bang Other Clinician: Referring Provider: Stoney Bang Treating Provider/Extender: Skipper Cliche in Treatment: 4 Active Problems ICD-10 Encounter Code Description Active Date MDM Diagnosis [phone] Pressure ulcer of left buttock, stage 3 05/29/2021 No Yes L89.153 Pressure ulcer of sacral region, stage 3 05/29/2021 No Yes L97.812 Non-pressure chronic ulcer of other part of right lower leg with fat layer 06/06/2021 No Yes exposed E11.622 Type 2 diabetes mellitus with other skin ulcer 05/29/2021 No Yes J44.9 Chronic obstructive pulmonary disease, unspecified 05/29/2021 No Yes L93.0 Discoid lupus erythematosus 05/29/2021 No Yes Inactive Problems Resolved Problems Electronic Signature(s) Signed: 06/30/2021 9:02:05 AM By: Worthy Keeler PA-C Entered By: Worthy Keeler on 06/30/2021 09:02:05 Jaime [name] ([id number]) -------------------------------------------------------------------------------- Progress Note Details Patient Name: Jaime [name] Date of Service: 06/30/2021 10:00 AM Medical Record Number: [id number] Patient Account Number: [id number] Date of Birth/Sex: 12/31/1985 (35 y.o. F) Treating RN:  Dolan Amen Primary Care Provider: Stoney Bang Other Clinician: Referring Provider: Stoney Bang Treating Provider/Extender: Skipper Cliche in Treatment: 4 Subjective Chief Complaint Information obtained from Patient Gluteal and sacral pressure ulcers History of Present Illness (HPI) 05/29/2021 upon evaluation today patient presents for initial evaluation here in our clinic concerning issues she has been having with what appears to be  pressure ulcerations on the gluteal and sacral region. She tells me that she does tend to sit a lot. With that being said it does appear that these are pressure ulcers to be honest. I do not see any evidence of infection but nonetheless I do believe that the patient is having some discomfort here. She has been placed on doxycycline she is also previously used Allevyn dressings but no specific wound care dressings have been utilized up to this point. These have been present for about a month she tells me. She does have diabetes with hemoglobin A1c of 10.3 she did see her endocrinologist yesterday she is try to work on that they got onto her she tells me. 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 She is on methadone looks like breakthrough oxycodone at this time. Nonetheless I believe that in general she seems to be doing pretty well but we do need to try and see what we can do to get these wounds healed for her as they are quite painful. 06/06/2021 upon evaluation today patient appears to be doing decently well in regard to the wounds in the gluteal region. She continues to have some pain here. There is some slough noted on the to try to sharply debride this away to try to help with healing currently. Fortunately I do feel like she is making progress here however. Unfortunately she does have an issue on her knee where she fell and I do believe we will get a need to perform  some debridement here to clear this away to allow to heal more effectively and quickly. 06/13/2021 upon evaluation today patient appears to be doing well with regard to her wounds. In fact the only wound that I see that still remains to be open or actually the 2 smaller areas on the left proximal and left just distal gluteus. Overall otherwise I feel like patient is doing quite well. There does not appear to be any signs of active infection at this time. No fevers, chills, nausea, vomiting, or diarrhea. 06/23/2021 upon evaluation today patient appears to be doing quite well with regard to her wounds. In fact she is healing quite nicely and seems to be showing signs of excellent improvement. Fortunately there does not appear to be any evidence of active infection at this time which is great news. No fevers, chills, nausea, vomiting, or diarrhea. 06/30/2021 upon evaluation today patient actually appears to be doing excellent. In fact she is completely healed. Fortunately there does not appear to be any signs of active infection at this time which is great news. Objective Constitutional Chronically ill appearing but in no apparent acute distress. Vitals Time Taken: 9:41 AM, Height: 70 in, Weight: 301 lbs, BMI: 43.2, Temperature: 98.5 F, Pulse: 92 bpm, Respiratory Rate: 16 breaths/min, Blood Pressure: 124/83 mmHg. Respiratory normal breathing without difficulty. Psychiatric this patient is able to make decisions and demonstrates good insight into disease process. Alert and Oriented x 3. pleasant and cooperative. General Notes: Upon inspection patient showed signs of complete epithelization which is great news. Integumentary (Hair, Skin) Wound #3 status is Open. Original cause of wound was Gradually Appeared. The date acquired was: 05/02/2021. The wound has been in treatment 4 weeks. The wound is located on the Left,Distal Gluteus. The wound measures 0cm length x 0cm width x 0cm depth; 0cm^2 area and  0cm^3 volume. There is no tunneling or  undermining noted. There is a none present amount of drainage noted. There is no granulation within the wound bed. There is no necrotic tissue within the wound bed. Jaime [name], Jaime [name] ([id number]) Assessment Active Problems ICD-10 Pressure ulcer of left buttock, stage 3 Pressure ulcer of sacral region, stage 3 Non-pressure chronic ulcer of other part of right lower leg with fat layer exposed Type 2 diabetes mellitus with other skin ulcer Chronic obstructive pulmonary disease, unspecified Discoid lupus erythematosus Plan Discharge From [facility]: Discharge from [facility] Complete 1. We will go ahead and discontinue wound care services at this point and the patient is in agreement with the plan. 2. I am also can recommend that we have the patient go ahead and continue with appropriate offloading she should get up and walk around frequently in between sitting watching TV or otherwise she should not be sitting for too long at any point in time. Obviously the more she can move around better. We will see patient back for reevaluation in 1 week here in the clinic. If anything worsens or changes patient will contact our office for additional recommendations. Electronic Signature(s) Signed: 06/30/2021 9:54:28 AM By: Worthy Keeler PA-C Entered By: Worthy Keeler on 06/30/2021 09:54:28 Jaime [name] ([id number]) -------------------------------------------------------------------------------- SuperBill Details Patient Name: Jaime [name] Date of Service: 06/30/2021 Medical Record Number: [id number] Patient Account Number: [id number] Date of Birth/Sex: September 04, 1986 (35 y.o. F) Treating RN: Carlene Coria Primary Care Provider: Stoney Bang Other Clinician: Referring Provider: Stoney Bang Treating Provider/Extender: Skipper Cliche in Treatment: 4 Diagnosis Coding ICD-10 Codes Code Description [phone] Pressure ulcer of left  buttock, stage 3 L89.153 Pressure ulcer of sacral region, stage 3 L97.812 Non-pressure chronic ulcer of other part of right lower leg with fat layer exposed E11.622 Type 2 diabetes mellitus with other skin ulcer J44.9 Chronic obstructive pulmonary disease, unspecified L93.0 Discoid lupus erythematosus Facility Procedures CPT4 Code: [id number] Description: [phone] - WOUND CARE VISIT-LEV 2 EST PT Modifier: Quantity: 1 Physician Procedures CPT4 Code: 7408144 Description: 81856 - WC PHYS LEVEL 3 - EST PT Modifier: Quantity: 1 CPT4 Code: Description: ICD-10 Diagnosis Description L89.323 Pressure ulcer of left buttock, stage 3 L89.153 Pressure ulcer of sacral region, stage 3 L97.812 Non-pressure chronic ulcer of other part of right lower leg with fat la E11.622 Type 2 diabetes mellitus  with other skin ulcer Modifier: yer exposed Quantity: Electronic Signature(s) Signed: 06/30/2021 9:54:39 AM By: Worthy Keeler PA-C Entered By: Worthy Keeler on 06/30/2021 09:54:39

## 2021-07-02 ENCOUNTER — Encounter: Payer: Medicaid Other | Attending: Nurse Practitioner | Admitting: Nutrition

## 2021-07-02 ENCOUNTER — Ambulatory Visit: Payer: Medicaid Other | Admitting: Nurse Practitioner

## 2021-07-02 ENCOUNTER — Other Ambulatory Visit: Payer: Self-pay

## 2021-07-02 ENCOUNTER — Encounter: Payer: Self-pay | Admitting: Nutrition

## 2021-07-02 VITALS — Ht 70.0 in | Wt 294.7 lb

## 2021-07-02 DIAGNOSIS — K7460 Unspecified cirrhosis of liver: Secondary | ICD-10-CM | POA: Diagnosis not present

## 2021-07-02 DIAGNOSIS — I10 Essential (primary) hypertension: Secondary | ICD-10-CM | POA: Insufficient documentation

## 2021-07-02 DIAGNOSIS — Z6841 Body Mass Index (BMI) 40.0 and over, adult: Secondary | ICD-10-CM | POA: Insufficient documentation

## 2021-07-02 DIAGNOSIS — E782 Mixed hyperlipidemia: Secondary | ICD-10-CM | POA: Diagnosis not present

## 2021-07-02 DIAGNOSIS — E1165 Type 2 diabetes mellitus with hyperglycemia: Secondary | ICD-10-CM | POA: Diagnosis not present

## 2021-07-02 DIAGNOSIS — IMO0002 Reserved for concepts with insufficient information to code with codable children: Secondary | ICD-10-CM

## 2021-07-02 NOTE — Progress Notes (Signed)
Medical Nutrition Therapy  Appointment Start time:  70  Appointment End time:  1200  Primary concerns today: Diabetes Type 2, Obesity Follow up Referral diagnosis: E11.8, E66.01 Preferred learning style:  no preference indicated Learning readiness:  change in progress   NUTRITION ASSESSMENT  Changes Made:  Has been staying with her poppa. Hasn't been eating as well. Her mom is with her today. Her mom fixes her insulin. Fatigue, frequent urination, increased thirst. Complains of sleeping a lot and snoring. 70/30 90 units twice a day, Metformin, GLipizide and Victoza Changes; Trying to eat more vegetables-still work in progress Has been passing blood recently in her stool and going back to MD at [facility]. A1C up to 10.3% from 8.9%. Lost 7 lbs, buy is most likely from hyperglycemia.  Admits to some depresssion and emotional issues and would like to work with a Social worker. Has  a dexcom but hasn't been using it.  Anthropometrics  Wt Readings from Last 3 Encounters:  07/02/21 294 lb 11.2 oz (133.7 kg)  05/28/21 (!) 301 lb (136.5 kg)  05/28/21 (!) 301 lb (136.5 kg)   Ht Readings from Last 3 Encounters:  07/02/21 5' 10"  (1.778 m)  05/28/21 5' 10"  (1.778 m)  05/28/21 5' 9"  (1.753 m)   Body mass index is 42.29 kg/m. @BMIFA @ Facility age limit for growth percentiles is 20 years. Facility age limit for growth percentiles is 20 years.   Clinical Medical Hx: Dm Type 2, Obesity, Fatty Liver, Hyperlipidemia, depression Medications: Dexcom, 70/30 90 units in am and 90 units pm. VIctoza daily., Metformin 1000 mg BID and Glipizide. A1C up to 10.3%   Labs:  Lab Results  Component Value Date   HGBA1C 10.3 (A) 05/28/2021   CMP Latest Ref Rng & Units 02/19/2021 09/09/2020 09/08/2020  Glucose 65 - 99 mg/dL 409(H) 125(H) 124(H)  BUN 6 - 20 mg/dL 6 11 12   Creatinine 0.57 - 1.00 mg/dL 0.59 0.63 0.53  Sodium 134 - 144 mmol/L 135 142 143  Potassium 3.5 - 5.2 mmol/L 3.8 4.3 3.5  Chloride 96  - 106 mmol/L 99 106 108  CO2 20 - 29 mmol/L 24 27 26   Calcium 8.7 - 10.2 mg/dL 8.7 8.7(L) 8.7(L)  Total Protein 6.0 - 8.5 g/dL 7.0 - -  Total Bilirubin 0.0 - 1.2 mg/dL 0.5 - -  Alkaline Phos 44 - 121 IU/L 139(H) - -  AST 0 - 40 IU/L 31 - -  ALT 0 - 32 IU/L 28 - -   Lipid Panel     Component Value Date/Time   CHOL 210 (H) 07/23/2020 0842   TRIG 86 08/12/2020 1023   HDL 60 07/23/2020 0842   CHOLHDL 3.5 07/23/2020 0842   LDLCALC 116 (H) 07/23/2020 0842   LABVLDL 34 07/23/2020 0842    Notable Signs/Symptoms: Fatigue, increased thirst, hunger ,increased urination. Blurry vision, headaches  Lifestyle & Dietary Hx .  Estimated daily fluid intake: 32 oz Supplements: mvt Sleep: varies Stress / self-care: yes Current average weekly physical activity: walks dog  24-Hr Dietary Recall First Meal: Skipped: Snack: water 8 oz Second Meal: Chicken salad, macaroni salad,  Sweet tea Snack: water 8 oz  Third Meal: Spahgetti and salad, diet pepsi zero Still craving a lot ice. Hasn't checked with PCP, appt is 07-16-21 Beverages: water, some tea, diet soda  Estimated Energy Needs Calories: 1200  Carbohydrate: 135g Protein: 90g Fat: 33g   NUTRITION DIAGNOSIS  NB-1.1 Food and nutrition-related knowledge deficit As related to Diabetes Type 2.  As  evidenced by A1C 8.9%   NUTRITION INTERVENTION  Nutrition education (E-1) on the following topics:  Nutrition and Diabetes education provided on My Plate, CHO counting, meal planning, portion sizes, timing of meals, avoiding snacks between meals unless having a low blood sugar, target ranges for A1C and blood sugars, signs/symptoms and treatment of hyper/hypoglycemia, monitoring blood sugars, taking medications as prescribed, benefits of exercising 30 minutes per day and prevention of complications of DM.   Handouts Provided Include  The Plate Method  Meal Plan Card Diabetes Instructions   Learning Style & Readiness for Change Teaching  method utilized: Visual & Auditory  Demonstrated degree of understanding via: Teach Back  Barriers to learning/adherence to lifestyle change: none  Goals Established by Pt Goals    Lose 1-2 lbs per week Goal am blood sugar less than 130 and bedtime less than 150 mg/dl. Goals  Eat three meals a day Cut out  sweet tea and drink only water Don't skip meals ASK for referral for sleep study Keep checking BS 4 times per day   MONITORING & EVALUATION Dietary intake, weekly physical activity, and blood sugars and weight in 1 month.  Next Steps  Patient is to work on Systems analyst and meal planning.

## 2021-07-02 NOTE — Patient Instructions (Addendum)
Goals  Eat three meals a day Cut out  sweet tea and drink only water Don't skip meals ASK for referral for sleep study Keep checking BS 4 times per day Put on dexcom and use it.

## 2021-07-10 ENCOUNTER — Encounter (INDEPENDENT_AMBULATORY_CARE_PROVIDER_SITE_OTHER): Payer: Self-pay | Admitting: *Deleted

## 2021-07-15 ENCOUNTER — Encounter: Payer: Self-pay | Admitting: Nutrition

## 2021-07-16 ENCOUNTER — Encounter: Payer: Self-pay | Admitting: Nurse Practitioner

## 2021-07-16 ENCOUNTER — Ambulatory Visit: Payer: Medicaid Other | Admitting: Nurse Practitioner

## 2021-07-16 ENCOUNTER — Ambulatory Visit (INDEPENDENT_AMBULATORY_CARE_PROVIDER_SITE_OTHER): Payer: Medicaid Other | Admitting: Nurse Practitioner

## 2021-07-16 ENCOUNTER — Encounter: Payer: Medicaid Other | Attending: Nurse Practitioner | Admitting: Nutrition

## 2021-07-16 ENCOUNTER — Other Ambulatory Visit: Payer: Self-pay

## 2021-07-16 ENCOUNTER — Encounter: Payer: Self-pay | Admitting: Nutrition

## 2021-07-16 VITALS — BP 114/70 | HR 94 | Ht 69.0 in | Wt 302.0 lb

## 2021-07-16 VITALS — Ht 70.0 in | Wt 302.0 lb

## 2021-07-16 DIAGNOSIS — I10 Essential (primary) hypertension: Secondary | ICD-10-CM | POA: Insufficient documentation

## 2021-07-16 DIAGNOSIS — E1165 Type 2 diabetes mellitus with hyperglycemia: Secondary | ICD-10-CM

## 2021-07-16 DIAGNOSIS — E118 Type 2 diabetes mellitus with unspecified complications: Secondary | ICD-10-CM | POA: Insufficient documentation

## 2021-07-16 DIAGNOSIS — K7460 Unspecified cirrhosis of liver: Secondary | ICD-10-CM | POA: Insufficient documentation

## 2021-07-16 DIAGNOSIS — Z9111 Patient's noncompliance with dietary regimen: Secondary | ICD-10-CM | POA: Diagnosis not present

## 2021-07-16 DIAGNOSIS — Z91119 Patient's noncompliance with dietary regimen due to unspecified reason: Secondary | ICD-10-CM

## 2021-07-16 DIAGNOSIS — IMO0002 Reserved for concepts with insufficient information to code with codable children: Secondary | ICD-10-CM

## 2021-07-16 DIAGNOSIS — E782 Mixed hyperlipidemia: Secondary | ICD-10-CM | POA: Diagnosis not present

## 2021-07-16 MED ORDER — DEXCOM G6 SENSOR MISC: 4.0000 | 2 refills | Status: DC

## 2021-07-16 NOTE — Patient Instructions (Signed)

## 2021-07-16 NOTE — Progress Notes (Signed)
07/16/2021, 1:36 PM     Endocrinology Follow Up Visit  Subjective:    Patient ID: Jaime [name], female    DOB: 1986/04/08.  [facility] is being seen in follow up for management of currently uncontrolled symptomatic diabetes requested by  Neale Burly, MD.   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)    Migraine headache    Neuropathy    Nocturnal seizures ([place])    Peptic ulcer    Spleen enlarged     Past Surgical History:  Procedure Laterality Date   CHOLECYSTECTOMY      Social History   Socioeconomic History   Marital status: Single    Spouse name: Not on file   Number of children: Not on file   Years of education: Not on file   Highest education level: Not on file  Occupational History   Not on file  Tobacco Use   Smoking status: Former    Packs/day: 0.00    Years: 5.00    Pack years: 0.00    Types: E-cigarettes, Cigarettes   Smokeless tobacco: Never  Vaping Use   Vaping Use: Every day   Substances: Nicotine, Flavoring  Substance and Sexual Activity   Alcohol use: No   Drug use: No   Sexual activity: Not Currently    Birth control/protection: Implant  Other Topics Concern   Not on file  Social History Narrative   Not on file   Social Determinants of Health   Financial Resource Strain: Not on file  Food Insecurity: Not on file  Transportation Needs: Not on file  Physical Activity: Not on file  Stress: Not on file  Social Connections: Not on file    Family History  Problem Relation Age  of Onset   Hypertension Mother    Hyperlipidemia Mother    Hyperlipidemia Maternal Grandfather     Outpatient Encounter Medications as of 07/16/2021  Medication Sig   Accu-Chek Softclix Lancets lancets 4 (four) times daily.   albuterol (ACCUNEB) 0.63 MG/3ML nebulizer solution Take by nebulization 4 (four) times daily.   albuterol (PROVENTIL) (2.5 MG/3ML) 0.083% nebulizer solution Take 2.5 mg by nebulization every 6 (six) hours as needed for wheezing or shortness of breath.   ARIPiprazole (ABILIFY) 5 MG tablet Take 5 mg by mouth daily.   aspirin EC 81 MG tablet Take 162 mg by mouth daily. Swallow whole.   cetirizine (ZYRTEC ALLERGY) 10 MG tablet Take 1 tablet (10 mg total) by mouth daily.   Continuous Blood Gluc Transmit (DEXCOM G6 TRANSMITTER) MISC 1 Piece by Does not apply route as directed.   cromolyn (OPTICROM) 4 %  ophthalmic solution 1 drop 2 (two) times daily.   etonogestrel (NEXPLANON) 68 MG IMPL implant 1 each by Subdermal route once.   furosemide (LASIX) 40 MG tablet Take 80 mg by mouth daily.    glipiZIDE (GLUCOTROL XL) 5 MG 24 hr tablet Take 1 tablet (5 mg total) by mouth daily with breakfast.   GLOBAL EASE INJECT PEN NEEDLES 31G X 8 MM MISC USE 1 PEN NEEDLE THREE TIMES DAILY.   glucose blood (ACCU-CHEK GUIDE) test strip Use as instructed to check blood glucose four times daily   hyoscyamine (ANASPAZ) 0.125 MG TBDP disintergrating tablet Place 0.125 mg under the tongue 3 (three) times daily as needed for bladder spasms or cramping (Patient Preference).    insulin isophane & regular human KwikPen (HUMULIN 70/30 MIX) (70-30) 100 UNIT/ML KwikPen Inject 90 Units into the skin 2 (two) times daily. Inject 90 units with breakfast and 80 units with supper   Insulin Pen Needle (PEN NEEDLES) 32G X 6 MM MISC 1 each by Does not apply route 3 (three) times daily.   liraglutide (VICTOZA) 18 MG/3ML SOPN Inject 1.8 mg into the skin daily.   lisinopril (ZESTRIL) 5 MG tablet TAKE 1 TABLET ONCE DAILY.    metFORMIN (GLUCOPHAGE) 1000 MG tablet Take 1 tablet (1,000 mg total) by mouth 2 (two) times daily with a meal.   methadone (DOLOPHINE) 10 MG/5ML solution Take 60 mg by mouth every morning. 75m   Multiple Vitamin (MULTIVITAMIN) tablet Take 1 tablet by mouth daily.   nystatin (MYCOSTATIN/NYSTOP) powder Apply 1 application topically 3 (three) times daily.   ondansetron (ZOFRAN-ODT) 4 MG disintegrating tablet Take 4 mg by mouth every 8 (eight) hours as needed for nausea.    oxyCODONE (ROXICODONE) 5 MG immediate release tablet Take 1 tablet (5 mg total) by mouth every 12 (twelve) hours as needed for breakthrough pain or severe pain.   pantoprazole (PROTONIX) 40 MG tablet Take 1 tablet (40 mg total) by mouth daily.   potassium chloride (KLOR-CON) 10 MEQ tablet Take 20 mEq by mouth daily.    pregabalin (LYRICA) 75 MG capsule Take 75 mg by mouth in the morning, at noon, in the evening, and at bedtime.    PROAIR HFA 108 (90 Base) MCG/ACT inhaler SMARTSIG:2 Puff(s) By Mouth Every 6 Hours PRN   propranolol (INDERAL) 10 MG tablet Take 10 mg by mouth 3 (three) times daily.   RESTASIS 0.05 % ophthalmic emulsion Place 1 drop into both eyes as needed (dry eye).    rosuvastatin (CRESTOR) 5 MG tablet Take 1 tablet (5 mg total) by mouth daily.   spironolactone (ALDACTONE) 100 MG tablet Take 100 mg by mouth daily.    sucralfate (CARAFATE) 1 g tablet Take 1 g by mouth 4 (four) times daily.   triamcinolone (KENALOG) 0.1 % Apply topically daily.   venlafaxine XR (EFFEXOR-XR) 150 MG 24 hr capsule Take total of 225 mg daily (150 mg + 75 mg )   venlafaxine XR (EFFEXOR-XR) 75 MG 24 hr capsule To take along with 1567mfor a total of 225 mg daily   [DISCONTINUED] Continuous Blood Gluc Sensor (DEXCOM G6 SENSOR) MISC 4 Pieces by Does not apply route once a week.   clonazePAM (KLONOPIN) 0.5 MG tablet Take 1 tablet (0.5 mg total) by mouth 2 (two) times daily as needed for anxiety.   Continuous Blood Gluc Sensor (DEXCOM G6  SENSOR) MISC 4 Pieces by Does not apply route once a week.   [DISCONTINUED] ARIPiprazole (ABILIFY) 2 MG tablet Take  1 tablet (2 mg total) by mouth daily. (Patient not taking: Reported on 07/16/2021)   [DISCONTINUED] lactulose (CHRONULAC) 10 GM/15ML solution Take 30 mLs (20 g total) by mouth 3 (three) times daily. (Patient not taking: No sig reported)   No facility-administered encounter medications on file as of 07/16/2021.    ALLERGIES: Allergies  Allergen Reactions   Iodine-131 Anaphylaxis, Shortness Of Breath and Swelling   Ivp Dye [Iodinated Diagnostic Agents] Anaphylaxis    Skin gets very red, unable to walk    Ketorolac Tromethamine Shortness Of Breath   Tylenol [Acetaminophen] Other (See Comments)    Due to cirrhosis    Gabapentin    Morphine And Related     Pt says she is not allergic to Morphine 08/12/20   Nsaids Other (See Comments)    Flares ulcers   Suboxone [Buprenorphine Hcl-Naloxone Hcl]    Vancomycin Swelling    Facial swelling,     VACCINATION STATUS: Immunization History  Administered Date(s) Administered   Influenza,inj,Quad PF,6+ Mos 07/18/2016   Moderna Sars-Covid-2 Vaccination 03/07/2020, 04/04/2020   Pneumococcal Polysaccharide-23 07/18/2016, 08/01/2020    Diabetes She presents for her follow-up diabetic visit. She has type 2 diabetes mellitus. Onset time: She was diagnosed at approximate age of 52 years. Her disease course has been worsening. There are no hypoglycemic associated symptoms. Pertinent negatives for hypoglycemia include no headaches, nervousness/anxiousness, pallor, sweats or tremors. Associated symptoms include blurred vision, fatigue, foot paresthesias, polydipsia and polyuria. Pertinent negatives for diabetes include no chest pain, no polyphagia and no weight loss. There are no hypoglycemic complications. (History of unresponsiveness requiring EMS and hospitalization- none recent) Symptoms are stable. Diabetic complications include heart  disease and peripheral neuropathy. Risk factors for coronary artery disease include diabetes mellitus, obesity, sedentary lifestyle, dyslipidemia and hypertension. Current diabetic treatment includes insulin injections and oral agent (monotherapy). She is compliant with treatment most of the time. Her weight is fluctuating minimally. She is following a low salt and generally unhealthy diet. When asked about meal planning, she reported none. She has had a previous visit with a dietitian. She participates in exercise intermittently. Her home blood glucose trend is increasing rapidly. Her breakfast blood glucose range is generally >200 mg/dl. Her lunch blood glucose range is generally >200 mg/dl. Her dinner blood glucose range is generally >200 mg/dl. Her bedtime blood glucose range is generally >200 mg/dl. Her overall blood glucose range is >200 mg/dl. (She presents today, accompanied by her mom, with her CGM, no logs showing significantly high glycemic profile.  She was not due for another A1c today.  Her average glucose over last 14 days was 354.  Analysis of her CGM shows TIR 13%, TAR (most in very high range) 87%, TBR 0%.  She continues to drink sweetened beverages, although she brings water with her to today's appointment.  I do believe she is consistent with taking her medications, but she is not compliant with a low sugar diet.) An ACE inhibitor/angiotensin II receptor blocker is being taken. She does not see a podiatrist.Eye exam is not current.  Hypertension This is a chronic problem. The current episode started more than 1 month ago. The problem is unchanged. The problem is controlled. Associated symptoms include blurred vision and peripheral edema. Pertinent negatives include no chest pain, headaches, palpitations, shortness of breath or sweats. There are no associated agents to hypertension. Risk factors for coronary artery disease include diabetes mellitus, dyslipidemia, obesity and sedentary lifestyle.  Past treatments include diuretics and beta blockers. The current treatment  provides no improvement. There are no compliance problems.     Review of systems  Constitutional: + Minimally fluctuating body weight,  current Body mass index is 44.6 kg/m. , + fatigue, no subjective hyperthermia, no subjective hypothermia Eyes: no blurry vision, no xerophthalmia ENT: no sore throat, no nodules palpated in throat, no dysphagia/odynophagia, no hoarseness Cardiovascular: no chest pain, no shortness of breath, no palpitations, + leg swelling Respiratory: no cough, no shortness of breath Gastrointestinal: no nausea/vomiting/diarrhea Genitourinary: + polyuria Musculoskeletal: no muscle/joint aches Skin: no rashes, no hyperemia, scabbed area to right knee from recent fall Neurological: no tremors, no numbness, no tingling, no dizziness Psychiatric: no depression, no anxiety  Objective:     BP 114/70   Pulse 94   Ht 5' 9"  (1.753 m)   Wt (!) 302 lb (137 kg)   BMI 44.60 kg/m   Wt Readings from Last 3 Encounters:  07/16/21 (!) 302 lb (137 kg)  07/02/21 294 lb 11.2 oz (133.7 kg)  05/28/21 (!) 301 lb (136.5 kg)    BP Readings from Last 3 Encounters:  07/16/21 114/70  05/28/21 115/76  05/19/21 127/83     Physical Exam- Limited  Constitutional:  Body mass index is 44.6 kg/m. , not in acute distress, normal state of mind, delayed responses, slurred speech Eyes:  EOMI, no exophthalmos Neck: Supple Cardiovascular: RRR, no murmurs, rubs, or gallops, no edema Respiratory: Adequate breathing efforts, no crackles, rales, rhonchi, or wheezing Musculoskeletal: no gross deformities, strength intact in all four extremities, no gross restriction of joint movements Skin:  no rashes, no hyperemia Neurological: no tremor with outstretched hands    CMP ( most recent) CMP     Component Value Date/Time   NA 135 02/19/2021 0822   K 3.8 02/19/2021 0822   CL 99 02/19/2021 0822   CO2 24 02/19/2021  0822   GLUCOSE 409 (H) 02/19/2021 0822   GLUCOSE 125 (H) 09/09/2020 0226   BUN 6 02/19/2021 0822   CREATININE 0.59 02/19/2021 0822   CALCIUM 8.7 02/19/2021 0822   PROT 7.0 02/19/2021 0822   ALBUMIN 3.6 (L) 02/19/2021 0822   AST 31 02/19/2021 0822   ALT 28 02/19/2021 0822   ALKPHOS 139 (H) 02/19/2021 0822   BILITOT 0.5 02/19/2021 0822   GFRNONAA >60 09/09/2020 0226   GFRAA >60 08/01/2020 0704     Diabetic Labs (most recent): Lab Results  Component Value Date   HGBA1C 10.3 (A) 05/28/2021   HGBA1C 8.9 (A) 02/26/2021   HGBA1C 10.1 (A) 11/27/2020     Lipid Panel ( most recent) Lipid Panel     Component Value Date/Time   CHOL 210 (H) 07/23/2020 0842   TRIG 86 08/12/2020 1023   HDL 60 07/23/2020 0842   CHOLHDL 3.5 07/23/2020 0842   LDLCALC 116 (H) 07/23/2020 0842   LABVLDL 34 07/23/2020 0842      Lab Results  Component Value Date   TSH 0.635 09/08/2020   TSH 0.993 07/30/2020   TSH 1.990 07/23/2020   TSH 1.690 08/22/2010   FREET4 0.72 (L) 07/23/2020   FREET4 1.11 08/22/2010      Assessment & Plan:   1) Type 2 diabetes mellitus with hyperglycemia, without long-term current use of insulin ([place])  - Kandra Nicolas has currently uncontrolled symptomatic type 2 DM since  35 years of age.  She presents today, accompanied by her mom, with her CGM, no logs showing significantly high glycemic profile.  She was not due for another A1c today.  Her average glucose over last 14 days was 354.  Analysis of her CGM shows TIR 13%, TAR (most in very high range) 87%, TBR 0%.  She continues to drink sweetened beverages, although she brings water with her to today's appointment.  I do believe she is consistent with taking her medications, but she is not compliant with a low sugar diet.  - I had a long discussion with her about the progressive nature of diabetes and the pathology behind its complications.  -her diabetes is complicated by obesity/sedentary life and she remains at a  high risk for more acute and chronic complications which include CAD, CVA, CKD, retinopathy, and neuropathy. These are all discussed in detail with her.  - Nutritional counseling repeated at each appointment due to patients tendency to fall back in to old habits.  - The patient admits there is a room for improvement in their diet and drink choices. -  Suggestion is made for the patient to avoid simple carbohydrates from their diet including Cakes, Sweet Desserts / Pastries, Ice Cream, Soda (diet and regular), Sweet Tea, Candies, Chips, Cookies, Sweet Pastries, Store Bought Juices, Alcohol in Excess of 1-2 drinks a day, Artificial Sweeteners, Coffee Creamer, and "Sugar-free" Products. This will help patient to have stable blood glucose profile and potentially avoid unintended weight gain.   - I encouraged the patient to switch to unprocessed or minimally processed complex starch and increased protein intake (animal or plant source), fruits, and vegetables.   - Patient is advised to stick to a routine mealtimes to eat 3 meals a day and avoid unnecessary snacks (to snack only to correct hypoglycemia).  -She sees Kieth Brightly, RDE after her appointment with me today.  - I have approached her with the following individualized plan to manage  her diabetes and patient agrees:   -Since her recent hospitalization, her mother has assumed responsibility for her diabetes management.  Number 1 priority will be to avoid hypoglycemia for her given her comorbidities.    -No changes will be made to her medications today.  I have asked her to re-engage in her diabetes management and return in 2 weeks to prove her dedication for a healthy lifestyle.  We also discussed the potential need to switch back to basal/bolus insulin 4x daily to regain control.   She is advised to continue 70/30 to 90 units SQ BID with meals if glucose is above 90 and she is eating.  She can continue Victoza 1.8 mg SQ daily, Metformin 1000 mg po twice  daily with meals, and Glipizide 5 mg XL daily with breakfast.  We discussed in detail, again, the need for her to comply with dietary restrictions.  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  She could benefit from seeking out a counselor for positive coping mechanisms and is advised to discuss with her PCP.   -She is encouraged to continue to monitor glucose 4 times daily, before meals and before bed and log on the clinic sheets provided.  They are instructed to call the clinic if she has readings less than 70 or greater than 300 for 3 tests in a row.     - Specific targets for  A1c;  LDL, HDL,  and Triglycerides were discussed with the patient.  2) Blood Pressure /Hypertension: Her blood pressure is controlled to target.  She is advised to continue Lasix 80 mg po daily, continue Lisinopril 5  mg po daily, continue Propanolol 10 mg po TID, and Aldactone 100 mg o daily  3) Lipids/Hyperlipidemia:   Review of her recent lipid profile from 07/23/20 shows uncontrolled LDL at 116 and elevated triglycerides of 197.  She is advised to continue Crestor 5 mg po daily at bedtime.  Side effects and precautions discussed with her.  Will recheck lipid panel on subsequent visits.  4)  Weight/Diet:  Her Body mass index is 44.6 kg/m.  -   clearly complicating her diabetes care.  She has recently lost 8 lbs. she is  a candidate for modest weight loss. I discussed with her the fact that loss of 5 - 10% of her  current body weight will have the most impact on her diabetes management.  Exercise, and detailed carbohydrates information provided  -  detailed on discharge instructions.  5) Chronic Care/Health Maintenance: -she is on ACE and statin medications and is encouraged to initiate and continue to follow up with Ophthalmology, Dentist,  Podiatrist at least yearly or according to recommendations, and advised to stay away from smoking. I have  recommended yearly flu vaccine and pneumonia vaccine at least every 5 years; moderate intensity exercise for up to 150 minutes weekly; and  sleep for at least 7 hours a day.  - she is advised to maintain close follow up with Hasanaj, Samul Dada, MD for primary care needs, as well as her other providers for optimal and coordinated care.      I spent 32 minutes in the care of the patient today including review of labs from [facility], Lipids, Thyroid Function, Hematology (current and previous including abstractions from other facilities); face-to-face time discussing  her blood glucose readings/logs, discussing hypoglycemia and hyperglycemia episodes and symptoms, medications doses, her options of short and long term treatment based on the latest standards of care / guidelines;  discussion about incorporating lifestyle medicine;  and documenting the encounter.    Please refer to Patient Instructions for Blood Glucose Monitoring and Insulin/Medications Dosing Guide"  in media tab for additional information. Please  also refer to " Patient Self Inventory" in the Media  tab for reviewed elements of pertinent patient history.  [facility] participated in the discussions, expressed understanding, and voiced agreement with the above plans.  All questions were answered to her satisfaction. she is encouraged to contact clinic should she have any questions or concerns prior to her return visit.    Follow up plan: - Return in about 2 weeks (around 07/30/2021) for Diabetes F/U, Bring meter and logs.    Rayetta Pigg, [facility] [address] [place], [place] [address] Phone: [phone] Fax: [phone]   07/16/2021, 1:36 PM

## 2021-07-16 NOTE — Patient Instructions (Signed)
  Goals Established by Pt Goals  B) 6-8 am 2 boiled eggs for breakfast with 1 slice toast and some fruit, water Lunch: 12-2 greens, grilled chicken, corn, 10 grapes, water D) 5-7 Chicken or some meat, small baked potatoes, greens and squash and onions, water Call Dr. Zada Girt to get an appt for the therapist. Don't skip meals Cut out sweet tea and sweetened beverage and only drink water with lemon Color when anxious or depressed.

## 2021-07-16 NOTE — Progress Notes (Signed)
Medical Nutrition Therapy  DM Follow up  1330  1400 Primary concerns today: Diabetes Type 2, Obesity Follow up Referral diagnosis: E11.8, E66.01 Preferred learning style:  no preference indicated Learning readiness:  change in progress   NUTRITION ASSESSMENT  Changes Made: She notes her blood sugars are high due to drinking sweet tea. She knows she should be drinking water with lemon. Struggles with the sugar craving. Skips breakfast and eating irregularly. Sleeping a lot. Fatigue, blurry vision, increased thirst and frequent urination. Some depressed feelings lately. Wants to get back to see a therapist for her stress and addiction to sugar.  14 day avg BS 354 mg/dl. TIR 13% TAR 87% 8 lbs weight gain. Taking 70/30 was taking 80 units and it was increased to 90 units BID today. Saw Rayetta Pigg, FNP today.  Anthropometrics  Wt Readings from Last 3 Encounters:  07/16/21 (!) 302 lb (137 kg)  07/02/21 294 lb 11.2 oz (133.7 kg)  05/28/21 (!) 301 lb (136.5 kg)   Ht Readings from Last 3 Encounters:  07/16/21 5' 9"  (1.753 m)  07/02/21 5' 10"  (1.778 m)  05/28/21 5' 10"  (1.778 m)   There is no height or weight on file to calculate BMI. @BMIFA @ Facility age limit for growth percentiles is 20 years. Facility age limit for growth percentiles is 20 years.   Clinical Medical Hx: Dm Type 2, Obesity, Fatty Liver, Hyperlipidemia, depression Medications: Dexcom, 70/30 90 units in am and 90 units pm. VIctoza daily., Metformin 1000 mg BID and Glipizide. A1C up to 10.3%   Labs:  Lab Results  Component Value Date   HGBA1C 10.3 (A) 05/28/2021   CMP Latest Ref Rng & Units 02/19/2021 09/09/2020 09/08/2020  Glucose 65 - 99 mg/dL 409(H) 125(H) 124(H)  BUN 6 - 20 mg/dL 6 11 12   Creatinine 0.57 - 1.00 mg/dL 0.59 0.63 0.53  Sodium 134 - 144 mmol/L 135 142 143  Potassium 3.5 - 5.2 mmol/L 3.8 4.3 3.5  Chloride 96 - 106 mmol/L 99 106 108  CO2 20 - 29 mmol/L 24 27 26   Calcium 8.7 - 10.2 mg/dL 8.7  8.7(L) 8.7(L)  Total Protein 6.0 - 8.5 g/dL 7.0 - -  Total Bilirubin 0.0 - 1.2 mg/dL 0.5 - -  Alkaline Phos 44 - 121 IU/L 139(H) - -  AST 0 - 40 IU/L 31 - -  ALT 0 - 32 IU/L 28 - -   Lipid Panel     Component Value Date/Time   CHOL 210 (H) 07/23/2020 0842   TRIG 86 08/12/2020 1023   HDL 60 07/23/2020 0842   CHOLHDL 3.5 07/23/2020 0842   LDLCALC 116 (H) 07/23/2020 0842   LABVLDL 34 07/23/2020 0842    Notable Signs/Symptoms: Fatigue, increased thirst, hunger ,increased urination. Blurry vision, headaches  Lifestyle & Dietary Hx .  Estimated daily fluid intake: 32 oz Supplements: mvt Sleep: varies Stress / self-care: yes Current average weekly physical activity: walks dog  24-Hr Dietary Recall First Meal: Skipped: Snack: water 8 oz Second Meal: Chicken salad, macaroni salad,  Sweet tea Snack: water 8 oz  Third Meal: Spahgetti and salad, diet pepsi zero Still craving a lot ice. Hasn't checked with PCP, appt is 07-16-21 Beverages: water, some tea, diet soda  Estimated Energy Needs Calories: 1200  Carbohydrate: 135g Protein: 90g Fat: 33g   NUTRITION DIAGNOSIS  NB-1.1 Food and nutrition-related knowledge deficit As related to Diabetes Type 2.  As evidenced by A1C 8.9%   NUTRITION INTERVENTION  Nutrition education (E-1) on  the following topics:  Nutrition and Diabetes education provided on My Plate, CHO counting, meal planning, portion sizes, timing of meals, avoiding snacks between meals unless having a low blood sugar, target ranges for A1C and blood sugars, signs/symptoms and treatment of hyper/hypoglycemia, monitoring blood sugars, taking medications as prescribed, benefits of exercising 30 minutes per day and prevention of complications of DM.   Handouts Provided Include  The Plate Method  Meal Plan Card Diabetes Instructions   Learning Style & Readiness for Change Teaching method utilized: Visual & Auditory  Demonstrated degree of understanding via: Teach  Back  Barriers to learning/adherence to lifestyle change: none  Goals Established by Pt Goals  B) 6-8 am 2 boiled eggs for breakfast with 1 slice toast and some fruit, water Lunch: 12-2 greens, grilled chicken, corn, 10 grapes, water D) 5-7 Chicken or some meat, small baked potatoes, greens and squash and onions, water Call Dr. Zada Girt to get an appt for the therapist. Don't skip meals Cut out sweet tea and sweetened beverage and only drink water with lemon Color when depressed or anxious   MONITORING & EVALUATION Dietary intake, weekly physical activity, and blood sugars and weight in 1 month.  Next Steps  Patient is to work on Systems analyst and meal planning.  She needs to be referred to a counselor that specializes in addictions.

## 2021-07-28 ENCOUNTER — Other Ambulatory Visit: Payer: Self-pay

## 2021-07-28 ENCOUNTER — Ambulatory Visit (INDEPENDENT_AMBULATORY_CARE_PROVIDER_SITE_OTHER): Payer: Medicaid Other | Admitting: Gastroenterology

## 2021-07-28 ENCOUNTER — Encounter (INDEPENDENT_AMBULATORY_CARE_PROVIDER_SITE_OTHER): Payer: Self-pay | Admitting: Gastroenterology

## 2021-07-28 ENCOUNTER — Encounter (INDEPENDENT_AMBULATORY_CARE_PROVIDER_SITE_OTHER): Payer: Self-pay

## 2021-07-28 ENCOUNTER — Telehealth (INDEPENDENT_AMBULATORY_CARE_PROVIDER_SITE_OTHER): Payer: Self-pay | Admitting: Gastroenterology

## 2021-07-28 ENCOUNTER — Other Ambulatory Visit (INDEPENDENT_AMBULATORY_CARE_PROVIDER_SITE_OTHER): Payer: Self-pay

## 2021-07-28 VITALS — BP 124/87 | HR 91 | Temp 98.3°F | Ht 69.0 in | Wt 293.0 lb

## 2021-07-28 DIAGNOSIS — K625 Hemorrhage of anus and rectum: Secondary | ICD-10-CM

## 2021-07-28 DIAGNOSIS — K7460 Unspecified cirrhosis of liver: Secondary | ICD-10-CM | POA: Diagnosis not present

## 2021-07-28 DIAGNOSIS — R1110 Vomiting, unspecified: Secondary | ICD-10-CM | POA: Insufficient documentation

## 2021-07-28 DIAGNOSIS — R112 Nausea with vomiting, unspecified: Secondary | ICD-10-CM

## 2021-07-28 DIAGNOSIS — K921 Melena: Secondary | ICD-10-CM

## 2021-07-28 DIAGNOSIS — R1319 Other dysphagia: Secondary | ICD-10-CM | POA: Insufficient documentation

## 2021-07-28 DIAGNOSIS — R1010 Upper abdominal pain, unspecified: Secondary | ICD-10-CM | POA: Insufficient documentation

## 2021-07-28 HISTORY — DX: Upper abdominal pain, unspecified: R10.10

## 2021-07-28 MED ORDER — OMEPRAZOLE 40 MG PO CPDR: 40.0000 mg | DELAYED_RELEASE_CAPSULE | Freq: Every day | ORAL | 3 refills | Status: DC

## 2021-07-28 MED ORDER — HYOSCYAMINE SULFATE 0.125 MG PO TBDP: 0.1250 mg | ORAL_TABLET | Freq: Three times a day (TID) | ORAL | 2 refills | Status: DC | PRN

## 2021-07-28 NOTE — Telephone Encounter (Signed)
Labs have been requested

## 2021-07-28 NOTE — Progress Notes (Addendum)
Primary Care Physician:  Neale Burly, MD Primary Gastroenterologist:  Dr. Jenetta Downer (Dr. Patsy [place] previously)  Chief Complaint  Patient presents with   Dysphagia    Pt having dysphagia, nausea, and passing blood for one month. Has vomiting, abdominal pain, dark stools, blood in stools, gas, bloating, heartburn, has a couple of stools a day, eats one meal a day and has some snacks.    HPI:   Jaime [name] is a 35 y.o. female with past medical history of anxiety, asthma, cirrhosis, COPD, depression, DM, Hep C, opiate dependence, previous IVDU, Lupus, Peptic ulcer and chronic pain, who is presenting today at the request of Dr. Sherrie Sport for dysphagia, rectal bleeding and nausea.   Patient reports she is passing blood from her rectum, states that bleeding has ranged from bright red to black. She has noticed blood in the toilet as well as on her stools and when wiping. She reports that she has nausea, upper abdominal pain (worse postprandial) and difficulty/pain with swallowing food. She reports that these symptoms have been present for 1 month. She is also having episodes of vomiting, maybe once per day, when she eats. She reports that vomiting occurs maybe 45-60 mins after eating. She reports she thinks she has had some weight loss recently, maybe around 10 pounds over the past few months. She endorses fatigue, shortness of breath and dizziness. She states that she feels a lot of pressure in her stomach like she needs to belch, states that when she is able too, she usually has regurgitation of food.  Reports similar issue prior to last EGD which was august of 2021. She endorses reflux and acid regurgitation every time she eats. she is trying to eat three meals per day, however, she Is having trouble eating a full meal due to early satiety.   She reports a history of GERD and was previously on protonix, however, PCP did labs last week and took her off of her PPI, she reports her platelets were  abnormal, however, she is unsure about her hemoglobin. She denies any regular NSAID use. She denies constipation or diarrrhea.   Hx of candida esophagitis on EGD 06/2020 which she was treated with diflucan for.   Cirrhosis: previously followed by [facility] for management of her cirrhosis, likely NASH/NAFLD, also has history of previous 3a Hep C (dx 2010, noted SVR in 2021, treated with 12 week course of Mavyret 01/2019)  last RUQ [place] was in sept 2021 showing mild to moderate ascites, splenic enlargement.  previous negative ceruloplasm, ANA, ASMA and alpha-1MM, as well as labs indicating immunity to HBV/HAV. last labs available for review 02/19/21 revealed normal AST/ALT and T bili with elevated ALk phos at 139 and albumin of 3.6. last INR was 1.2 on 09/08/20. No previous elastography for review.   She is currently on lasix, spironolactone and lactulose for management of cirrhosis. She has history of ascites, requiring paracentesis in the past.   Last colonoscopy: n/a Last EGD:06/12/20, candida in upper esophagus (treated with diflucan), ulcerative esophagitis at GE junction, gastritis in stomach, single ulcer in duodenal bulb, with duodenal mucosa showing no abnormality. No presence of varices  Family hx. Grandfather had colon polyps Social hx: denies alcohol, vapes  Past Medical History:  Diagnosis Date   Anxiety    Asthma    Chronic abdominal pain    Chronic back pain    Cirrhosis ([place])    Cirrhosis ([place])    COPD (chronic obstructive pulmonary disease) ([place])  Depression    Diabetes mellitus without complication ([place])    Diabetes mellitus, type II ([place])    Hepatitis C    Insomnia    Long-term current use of methadone for opiate dependence ([place])    Lupus ([place])    Migraine headache    Neuropathy    Nocturnal seizures ([facility])    Peptic ulcer    Spleen enlarged     Past Surgical History:  Procedure Laterality Date   CHOLECYSTECTOMY      Current Outpatient Medications  Medication Sig  Dispense Refill   Accu-Chek Softclix Lancets lancets 4 (four) times daily.     albuterol (ACCUNEB) 0.63 MG/3ML nebulizer solution Take by nebulization 4 (four) times daily.     albuterol (PROVENTIL) (2.5 MG/3ML) 0.083% nebulizer solution Take 2.5 mg by nebulization every 6 (six) hours as needed for wheezing or shortness of breath.     ARIPiprazole (ABILIFY) 5 MG tablet Take 5 mg by mouth daily.     aspirin EC 81 MG tablet Take 162 mg by mouth daily. Swallow whole.     cetirizine (ZYRTEC ALLERGY) 10 MG tablet Take 1 tablet (10 mg total) by mouth daily. 30 tablet 0   clonazePAM (KLONOPIN) 0.5 MG tablet Take 1 tablet (0.5 mg total) by mouth 2 (two) times daily as needed for anxiety. 60 tablet 2   Continuous Blood Gluc Sensor (DEXCOM G6 SENSOR) MISC 4 Pieces by Does not apply route once a week. 4 each 2   Continuous Blood Gluc Transmit (DEXCOM G6 TRANSMITTER) MISC 1 Piece by Does not apply route as directed. 1 each 1   cromolyn (OPTICROM) 4 % ophthalmic solution 1 drop 2 (two) times daily.     etonogestrel (NEXPLANON) 68 MG IMPL implant 1 each by Subdermal route once.     furosemide (LASIX) 40 MG tablet Take 80 mg by mouth daily.      glipiZIDE (GLUCOTROL XL) 5 MG 24 hr tablet Take 1 tablet (5 mg total) by mouth daily with breakfast. 90 tablet 3   GLOBAL EASE INJECT PEN NEEDLES 31G X 8 MM MISC USE 1 PEN NEEDLE THREE TIMES DAILY. 100 each 2   glucose blood (ACCU-CHEK GUIDE) test strip Use as instructed to check blood glucose four times daily 150 each 5   hyoscyamine (ANASPAZ) 0.125 MG TBDP disintergrating tablet Place 0.125 mg under the tongue 3 (three) times daily as needed for bladder spasms or cramping (Patient Preference).      insulin isophane & regular human KwikPen (HUMULIN 70/30 MIX) (70-30) 100 UNIT/ML KwikPen Inject 90 Units into the skin 2 (two) times daily. Inject 90 units with breakfast and 80 units with supper 90 mL 3   Insulin Pen Needle (PEN NEEDLES) 32G X 6 MM MISC 1 each by Does not  apply route 3 (three) times daily. 100 each 3   liraglutide (VICTOZA) 18 MG/3ML SOPN Inject 1.8 mg into the skin daily. 21 mL 3   lisinopril (ZESTRIL) 5 MG tablet TAKE 1 TABLET ONCE DAILY. 90 tablet 0   metFORMIN (GLUCOPHAGE) 1000 MG tablet Take 1 tablet (1,000 mg total) by mouth 2 (two) times daily with a meal. 180 tablet 3   methadone (DOLOPHINE) 10 MG/5ML solution Take 60 mg by mouth every morning. 80m     Multiple Vitamin (MULTIVITAMIN) tablet Take 1 tablet by mouth daily.     nystatin (MYCOSTATIN/NYSTOP) powder Apply 1 application topically 3 (three) times daily. 15 g 0   ondansetron (ZOFRAN-ODT) 4 MG disintegrating tablet Take  4 mg by mouth every 8 (eight) hours as needed for nausea.      oxyCODONE (ROXICODONE) 5 MG immediate release tablet Take 1 tablet (5 mg total) by mouth every 12 (twelve) hours as needed for breakthrough pain or severe pain. 5 tablet 0   pantoprazole (PROTONIX) 40 MG tablet Take 1 tablet (40 mg total) by mouth daily.     potassium chloride (KLOR-CON) 10 MEQ tablet Take 20 mEq by mouth daily.      pregabalin (LYRICA) 75 MG capsule Take 75 mg by mouth in the morning, at noon, in the evening, and at bedtime.      PROAIR HFA 108 (90 Base) MCG/ACT inhaler SMARTSIG:2 Puff(s) By Mouth Every 6 Hours PRN     propranolol (INDERAL) 10 MG tablet Take 10 mg by mouth 3 (three) times daily.     RESTASIS 0.05 % ophthalmic emulsion Place 1 drop into both eyes as needed (dry eye).      rosuvastatin (CRESTOR) 5 MG tablet Take 1 tablet (5 mg total) by mouth daily. 90 tablet 3   spironolactone (ALDACTONE) 100 MG tablet Take 100 mg by mouth daily.      sucralfate (CARAFATE) 1 g tablet Take 1 g by mouth 4 (four) times daily.     triamcinolone (KENALOG) 0.1 % Apply topically daily.     venlafaxine XR (EFFEXOR-XR) 150 MG 24 hr capsule Take total of 225 mg daily (150 mg + 75 mg ) 90 capsule 0   venlafaxine XR (EFFEXOR-XR) 75 MG 24 hr capsule To take along with 119m for a total of 225 mg daily  90 capsule 0   No current facility-administered medications for this visit.    Allergies as of 07/28/2021 - Review Complete 07/28/2021  Allergen Reaction Noted   Iodine-131 Anaphylaxis, Shortness Of Breath, and Swelling 05/01/2018   Ivp dye [iodinated diagnostic agents] Anaphylaxis 10/07/2013   Ketorolac tromethamine Shortness Of Breath 12/22/2010   Tylenol [acetaminophen] Other (See Comments) 01/23/2019   Gabapentin  05/28/2020   Morphine and related  05/28/2020   Nsaids Other (See Comments) 12/22/2010   Suboxone [buprenorphine hcl-naloxone hcl]  05/28/2020   Vancomycin Swelling 05/15/2018    Family History  Problem Relation Age of Onset   Hypertension Mother    Hyperlipidemia Mother    Hyperlipidemia Maternal Grandfather     Social History   Socioeconomic History   Marital status: Single    Spouse name: Not on file   Number of children: Not on file   Years of education: Not on file   Highest education level: Not on file  Occupational History   Not on file  Tobacco Use   Smoking status: Every Day    Types: E-cigarettes   Smokeless tobacco: Never  Vaping Use   Vaping Use: Every day   Substances: Nicotine, Flavoring  Substance and Sexual Activity   Alcohol use: No   Drug use: No   Sexual activity: Not Currently    Birth control/protection: Implant  Other Topics Concern   Not on file  Social History Narrative   Not on file   Social Determinants of Health   Financial Resource Strain: Not on file  Food Insecurity: Not on file  Transportation Needs: Not on file  Physical Activity: Not on file  Stress: Not on file  Social Connections: Not on file  Intimate Partner Violence: Not on file   Review of Systems: Gen: Denies any fever, chills, fatigue, weight loss, lack of appetite.  CV: Denies chest  pain, heart palpitations, peripheral edema, syncope.  Resp: Denies shortness of breath at rest or with exertion. Denies wheezing or cough.  GI: +dysphagia, +rectal  bleeding, +melena, +reflux, +epigastric pain GU : Denies urinary burning, urinary frequency, urinary hesitancy MS: Denies joint pain, muscle weakness, cramps, or limitation of movement.  Derm: Denies rash, itching, dry skin Psych: Denies depression, anxiety, memory loss, and confusion Heme: Denies bruising, bleeding, and enlarged lymph nodes.  Physical Exam: BP 124/87 (BP Location: Right Arm, Patient Position: Sitting, Cuff Size: Large)   Pulse 91   Temp 98.3 F (36.8 C) (Oral)   Ht _0  (1.753 m)   Wt 293 lb (132.9 kg)   BMI 43.27 kg/m  General:   Alert and oriented. Pleasant and cooperative. Well-nourished and well-developed.  Head:  Normocephalic and atraumatic. Eyes:  Without icterus, sclera clear and conjunctiva pink.  Ears:  Normal auditory acuity. Mouth:  No deformity or lesions, oral mucosa pink.  Lungs:  Clear to auscultation bilaterally. No wheezes, rales, or rhonchi. No distress.  Heart:  S1, S2 present without murmurs appreciated.  Abdomen:  +BS, soft but full, mild ascites with generalized TTP. No HSM noted. No guarding or rebound. No masses appreciated.  Rectal:  Deferred  Msk:  Symmetrical without gross deformities. Normal posture. Extremities:  Without edema. Neurologic:  Alert and  oriented x4;  grossly normal neurologically. Skin:  Intact without significant lesions or rashes. Psych:  Alert and cooperative. Normal mood and affect.  ASSESSMENT: Jaime [name] is a 35 y.o. female with pmh anxiety, asthma, cirrhosis, COPD, depression, DM, Hep C, opiate dependence, previous IVDU (10+ years clean), Lupus, Peptic ulcer and chronic pain, presenting today with nausea, dysphagia, early satiety, rectal bleeding and melena.   She reports BRBPR as well as melena, upper abdominal pain (worse postprandial), early satiety and difficulty/pain with swallowing food x1 month with some vomiting, eating only one meal per day. She reports that vomiting occurs maybe 45-60 mins  after eating. Reported 10 pound weight loss recently. She endorses fatigue, shortness of breath and dizziness and bloating. Reports similar symptoms prior to last EGD which was august of 2021 where she was found to have candida esophagitis.   She reports a history of GERD and was previously on protonix, however, PCP did labs last week and took her off of her PPI, she reports her platelets were abnormal, however, she is unsure about her hemoglobin. She denies any regular NSAID use. She denies constipation or diarrrhea.   Hx of candida esophagitis on EGD 06/2020 which she was treated with diflucan for as well as ulcerative esophagitis, gastritis in stomach, single ulcer in duodenal bulb. Suspect patient could have some recurrence of candida esophagitis/PUD and possible gastritis. We will repeat EGD given her symptoms and clinical presentation.  Cirrhosis:  Without previous evidence of varices, last evaluated in Aug 2021. previously followed by [facility], likely NASH/NAFLD, also has history of previous 3a Hep C (dx 2010, noted SVR in 2021, treated with 12 week course of Mavyret 01/2019).previous negative ceruloplasm, ANA, ASMA and alpha-1MM, as well as labs indicating immunity to HBV/HAV. last labs available for review 02/19/21 revealed normal AST/ALT and T bili with elevated ALk phos at 139 and albumin of 3.6. last INR was 1.2 on 09/08/20. No previous elastography for review.   We will obtain records from Dr. Sherrie Sport as patient will need updated cirrhosis work up. Last RUQ [place] was in sept 2021 revealing mild to moderate ascites and splenomegaly. She is currently maintained  on laxis 36m daily, aldactone 1058mdaily and lactulose 10g TID. She is alert and oriented, denies any recent episodes of confusion or worsening edema.   Will updated MELD score when meld labs are available.  PLAN: Obtain labs from Dr. HaSherrie Sportmay need to add on labs for cirrhosis management EGD/Colonoscopy Update RUQ liver [place]ue  lactulose 10g TID Continue aldactone 10082maily Continue lasix 38m64mily Refill of levsin 0.125mg65mt, TID prn Rx Omeprazole 40mg 1my  Follow up 3 months  Bekim Werntz L. CarlanAlver Sorrow APRN, AGNP-C Adult-Gerontology Nurse Practitioner [facility]   *addendum 07/29/21: labs obtained from PCP (drawn 07/16/21) revealed T. Bili 1.1, albumin 3.5, Alk phos 177, AST 50 and ALT 63. No recent hgb, INR or AFP. CBC, CMP and INR have been ordered as part of pre-procedure labs for egd/colonoscopy, will add AFP to be drawn as well.   **addendum 08/06/21 MELD 7, child pugh class B

## 2021-07-28 NOTE — Patient Instructions (Addendum)
I have sent omeprazole 7m to your pharmacy, you will take this once daily, 30-45 minutes prior to breakfast. I have also sent a refill for the hyoscyamine to your pharmacy, be mindful this can cause constipation.  We will get you scheduled for EGD and Colonoscopy as well as update your liver ultrasound due to your history of cirrhosis. We will obtain your recent labs from Dr. HSherrie Sport however, we may need to add on some other liver specific labs depending on what was done by your PCP.  Follow up in 3 months

## 2021-07-28 NOTE — Patient Instructions (Signed)
Jaime [name]  07/28/2021     @PREFPERIOPPHARMACY @   Your procedure is scheduled on 08/01/2021.   Report to [name] at  [facility]   Call this number if you have problems the morning of surgery:  [phone]   Remember:  Follow the diet and prep instructions given to you to by the office.     Use your nebulizer before you come.  Take 40 units of your insulin the evening before your procedure.     DO NOT take any medications for diabetes the morning of your procedure.    Take these medicines the morning of surgery with A SIP OF WATER       abilify, clonazepam, methadone, omeprazole, zofran (if needed), lyrica, prooranolol, effexor.     Do not wear jewelry, make-up or nail polish.  Do not wear lotions, powders, or perfumes, or deodorant.  Do not shave 48 hours prior to surgery.  Men may shave face and neck.  Do not bring valuables to the hospital.  [facility] is not responsible for any belongings or valuables.  Contacts, dentures or bridgework may not be worn into surgery.  Leave your suitcase in the car.  After surgery it may be brought to your room.  For patients admitted to the hospital, discharge time will be determined by your treatment team.  Patients discharged the day of surgery will not be allowed to drive home and must have someone with them for 24 hours.    Special instructions:   DO NOT smoke tobacco or vape for 24 hours before your procedure.  Please read over the following fact sheets that you were given. Anesthesia Post-op Instructions and Care and Recovery After Surgery      Upper Endoscopy, Adult, Care After This sheet gives you information about how to care for yourself after your procedure. Your health care provider may also give you more specific instructions. If you have problems or questions, contact your health care provider. What can I expect after the procedure? After the procedure, it is common to have: A sore  throat. Mild stomach pain or discomfort. Bloating. Nausea. Follow these instructions at home:  Follow instructions from your health care provider about what to eat or drink after your procedure. Return to your normal activities as told by your health care provider. Ask your health care provider what activities are safe for you. Take over-the-counter and prescription medicines only as told by your health care provider. If you were given a sedative during the procedure, it can affect you for several hours. Do not drive or operate machinery until your health care provider says that it is safe. Keep all follow-up visits as told by your health care provider. This is important. Contact a health care provider if you have: A sore throat that lasts longer than one day. Trouble swallowing. Get help right away if: You vomit blood or your vomit looks like coffee grounds. You have: A fever. Bloody, black, or tarry stools. A severe sore throat or you cannot swallow. Difficulty breathing. Severe pain in your chest or abdomen. Summary After the procedure, it is common to have a sore throat, mild stomach discomfort, bloating, and nausea. If you were given a sedative during the procedure, it can affect you for several hours. Do not drive or operate machinery until your health care provider says that it is safe. Follow instructions from your health care provider about what to eat or drink  after your procedure. Return to your normal activities as told by your health care provider. This information is not intended to replace advice given to you by your health care provider. Make sure you discuss any questions you have with your health care provider. Document Revised: 10/17/2019 Document Reviewed: 03/21/2018 Elsevier Patient Education  2022 [place]. Colonoscopy, Adult, Care After This sheet gives you information about how to care for yourself after your procedure. Your health care provider may also  give you more specific instructions. If you have problems or questions, contact your health care provider. What can I expect after the procedure? After the procedure, it is common to have: A small amount of blood in your stool for 24 hours after the procedure. Some gas. Mild cramping or bloating of your abdomen. Follow these instructions at home: Eating and drinking  Drink enough fluid to keep your urine pale yellow. Follow instructions from your health care provider about eating or drinking restrictions. Resume your normal diet as instructed by your health care provider. Avoid heavy or fried foods that are hard to digest. Activity Rest as told by your health care provider. Avoid sitting for a long time without moving. Get up to take short walks every 1-2 hours. This is important to improve blood flow and breathing. Ask for help if you feel weak or unsteady. Return to your normal activities as told by your health care provider. Ask your health care provider what activities are safe for you. Managing cramping and bloating  Try walking around when you have cramps or feel bloated. Apply heat to your abdomen as told by your health care provider. Use the heat source that your health care provider recommends, such as a moist heat pack or a heating pad. Place a towel between your skin and the heat source. Leave the heat on for 20-30 minutes. Remove the heat if your skin turns bright red. This is especially important if you are unable to feel pain, heat, or cold. You may have a greater risk of getting burned. General instructions If you were given a sedative during the procedure, it can affect you for several hours. Do not drive or operate machinery until your health care provider says that it is safe. For the first 24 hours after the procedure: Do not sign important documents. Do not drink alcohol. Do your regular daily activities at a slower pace than normal. Eat soft foods that are easy to  digest. Take over-the-counter and prescription medicines only as told by your health care provider. Keep all follow-up visits as told by your health care provider. This is important. Contact a health care provider if: You have blood in your stool 2-3 days after the procedure. Get help right away if you have: More than a small spotting of blood in your stool. Large blood clots in your stool. Swelling of your abdomen. Nausea or vomiting. A fever. Increasing pain in your abdomen that is not relieved with medicine. Summary After the procedure, it is common to have a small amount of blood in your stool. You may also have mild cramping and bloating of your abdomen. If you were given a sedative during the procedure, it can affect you for several hours. Do not drive or operate machinery until your health care provider says that it is safe. Get help right away if you have a lot of blood in your stool, nausea or vomiting, a fever, or increased pain in your abdomen. This information is not intended to replace  advice given to you by your health care provider. Make sure you discuss any questions you have with your health care provider. Document Revised: 10/13/2019 Document Reviewed: 05/15/2019 Elsevier Patient Education  [place] After This sheet gives you information about how to care for yourself after your procedure. Your health care provider may also give you more specific instructions. If you have problems or questions, contact your health care provider. What can I expect after the procedure? After the procedure, it is common to have: Tiredness. Forgetfulness about what happened after the procedure. Impaired judgment for important decisions. Nausea or vomiting. Some difficulty with balance. Follow these instructions at home: For the time period you were told by your health care provider:   Rest as needed. Do not participate in activities where you  could fall or become injured. Do not drive or use machinery. Do not drink alcohol. Do not take sleeping pills or medicines that cause drowsiness. Do not make important decisions or sign legal documents. Do not take care of children on your own. Eating and drinking Follow the diet that is recommended by your health care provider. Drink enough fluid to keep your urine pale yellow. If you vomit: Drink water, juice, or soup when you can drink without vomiting. Make sure you have little or no nausea before eating solid foods. General instructions Have a responsible adult stay with you for the time you are told. It is important to have someone help care for you until you are awake and alert. Take over-the-counter and prescription medicines only as told by your health care provider. If you have sleep apnea, surgery and certain medicines can increase your risk for breathing problems. Follow instructions from your health care provider about wearing your sleep device: Anytime you are sleeping, including during daytime naps. While taking prescription pain medicines, sleeping medicines, or medicines that make you drowsy. Avoid smoking. Keep all follow-up visits as told by your health care provider. This is important. Contact a health care provider if: You keep feeling nauseous or you keep vomiting. You feel light-headed. You are still sleepy or having trouble with balance after 24 hours. You develop a rash. You have a fever. You have redness or swelling around the IV site. Get help right away if: You have trouble breathing. You have new-onset confusion at home. Summary For several hours after your procedure, you may feel tired. You may also be forgetful and have poor judgment. Have a responsible adult stay with you for the time you are told. It is important to have someone help care for you until you are awake and alert. Rest as told. Do not drive or operate machinery. Do not drink alcohol or  take sleeping pills. Get help right away if you have trouble breathing, or if you suddenly become confused. This information is not intended to replace advice given to you by your health care provider. Make sure you discuss any questions you have with your health care provider. Document Revised: 07/04/2020 Document Reviewed: 09/21/2019 Elsevier Patient Education  2022 [employer].

## 2021-07-29 ENCOUNTER — Encounter (HOSPITAL_COMMUNITY): Payer: Self-pay | Admitting: Anesthesiology

## 2021-07-29 ENCOUNTER — Encounter (HOSPITAL_COMMUNITY): Payer: Self-pay | Admitting: *Deleted

## 2021-07-29 ENCOUNTER — Emergency Department (HOSPITAL_COMMUNITY): Payer: Medicaid Other

## 2021-07-29 ENCOUNTER — Other Ambulatory Visit (INDEPENDENT_AMBULATORY_CARE_PROVIDER_SITE_OTHER): Payer: Self-pay

## 2021-07-29 ENCOUNTER — Encounter (HOSPITAL_COMMUNITY)
Admission: RE | Admit: 2021-07-29 | Discharge: 2021-07-29 | Disposition: A | Discharge status: Home / Self Care | Payer: Medicaid Other | Source: Ambulatory Visit | Attending: Gastroenterology | Admitting: Gastroenterology

## 2021-07-29 ENCOUNTER — Emergency Department (HOSPITAL_COMMUNITY)
Admission: EM | Admit: 2021-07-29 | Discharge: 2021-07-29 | Disposition: A | Discharge status: Home / Self Care | Payer: Medicaid Other | Attending: Emergency Medicine | Admitting: Emergency Medicine

## 2021-07-29 ENCOUNTER — Other Ambulatory Visit (INDEPENDENT_AMBULATORY_CARE_PROVIDER_SITE_OTHER): Payer: Self-pay | Admitting: Gastroenterology

## 2021-07-29 ENCOUNTER — Other Ambulatory Visit: Payer: Self-pay

## 2021-07-29 DIAGNOSIS — R112 Nausea with vomiting, unspecified: Secondary | ICD-10-CM | POA: Diagnosis not present

## 2021-07-29 DIAGNOSIS — Z7984 Long term (current) use of oral hypoglycemic drugs: Secondary | ICD-10-CM | POA: Insufficient documentation

## 2021-07-29 DIAGNOSIS — E1165 Type 2 diabetes mellitus with hyperglycemia: Secondary | ICD-10-CM | POA: Diagnosis not present

## 2021-07-29 DIAGNOSIS — Z79899 Other long term (current) drug therapy: Secondary | ICD-10-CM | POA: Insufficient documentation

## 2021-07-29 DIAGNOSIS — J45909 Unspecified asthma, uncomplicated: Secondary | ICD-10-CM | POA: Diagnosis not present

## 2021-07-29 DIAGNOSIS — Z7982 Long term (current) use of aspirin: Secondary | ICD-10-CM | POA: Insufficient documentation

## 2021-07-29 DIAGNOSIS — K7460 Unspecified cirrhosis of liver: Secondary | ICD-10-CM

## 2021-07-29 DIAGNOSIS — Z7951 Long term (current) use of inhaled steroids: Secondary | ICD-10-CM | POA: Diagnosis not present

## 2021-07-29 DIAGNOSIS — J449 Chronic obstructive pulmonary disease, unspecified: Secondary | ICD-10-CM | POA: Insufficient documentation

## 2021-07-29 DIAGNOSIS — Z794 Long term (current) use of insulin: Secondary | ICD-10-CM | POA: Diagnosis not present

## 2021-07-29 DIAGNOSIS — F17290 Nicotine dependence, other tobacco product, uncomplicated: Secondary | ICD-10-CM | POA: Insufficient documentation

## 2021-07-29 DIAGNOSIS — G8929 Other chronic pain: Secondary | ICD-10-CM

## 2021-07-29 DIAGNOSIS — R188 Other ascites: Secondary | ICD-10-CM

## 2021-07-29 DIAGNOSIS — R739 Hyperglycemia, unspecified: Secondary | ICD-10-CM | POA: Diagnosis present

## 2021-07-29 DIAGNOSIS — E1140 Type 2 diabetes mellitus with diabetic neuropathy, unspecified: Secondary | ICD-10-CM | POA: Insufficient documentation

## 2021-07-29 DIAGNOSIS — Z01818 Encounter for other preprocedural examination: Secondary | ICD-10-CM | POA: Insufficient documentation

## 2021-07-29 DIAGNOSIS — R109 Unspecified abdominal pain: Secondary | ICD-10-CM | POA: Insufficient documentation

## 2021-07-29 LAB — URINALYSIS, ROUTINE W REFLEX MICROSCOPIC
Bilirubin Urine: NEGATIVE
Glucose, UA: >=500 mg/dL — AB
Ketones, ur: NEGATIVE mg/dL
Nitrite: NEGATIVE
Protein, ur: NEGATIVE mg/dL
Specific Gravity, Urine: 1.03 (ref 1.005–1.030)
pH: 7 (ref 5.0–8.0)

## 2021-07-29 LAB — HEPATIC FUNCTION PANEL
ALT: 45 U/L — ABNORMAL HIGH (ref 0–44)
AST: 44 U/L — ABNORMAL HIGH (ref 15–41)
Albumin: 3.2 g/dL — ABNORMAL LOW (ref 3.5–5.0)
Alkaline Phosphatase: 168 U/L — ABNORMAL HIGH (ref 38–126)
Bilirubin, Direct: 0.3 mg/dL — ABNORMAL HIGH (ref 0.0–0.2)
Indirect Bilirubin: 0.6 mg/dL (ref 0.3–0.9)
Total Bilirubin: 0.9 mg/dL (ref 0.3–1.2)
Total Protein: 7.7 g/dL (ref 6.5–8.1)

## 2021-07-29 LAB — COMPREHENSIVE METABOLIC PANEL
ALT: 41 U/L (ref 0–44)
AST: 42 U/L — ABNORMAL HIGH (ref 15–41)
Albumin: 3 g/dL — ABNORMAL LOW (ref 3.5–5.0)
Alkaline Phosphatase: 150 U/L — ABNORMAL HIGH (ref 38–126)
Anion gap: 9 (ref 5–15)
BUN: 5 mg/dL — ABNORMAL LOW (ref 6–20)
CO2: 23 mmol/L (ref 22–32)
Calcium: 8.4 mg/dL — ABNORMAL LOW (ref 8.9–10.3)
Chloride: 98 mmol/L (ref 98–111)
Creatinine, Ser: 0.63 mg/dL (ref 0.44–1.00)
GFR, Estimated: >60 mL/min (ref >60)
Glucose, Bld: 613 mg/dL (ref 70–99)
Potassium: 4 mmol/L (ref 3.5–5.1)
Sodium: 130 mmol/L — ABNORMAL LOW (ref 135–145)
Total Bilirubin: 0.7 mg/dL (ref 0.3–1.2)
Total Protein: 7.1 g/dL (ref 6.5–8.1)

## 2021-07-29 LAB — CBC WITH DIFFERENTIAL/PLATELET
Abs Immature Granulocytes: 0.03 10*3/uL (ref 0.00–0.07)
Basophils Absolute: 0 10*3/uL (ref 0.0–0.1)
Basophils Relative: 1 %
Eosinophils Absolute: 0.1 10*3/uL (ref 0.0–0.5)
Eosinophils Relative: 3 %
HCT: 40.6 % (ref 36.0–46.0)
Hemoglobin: 13.5 g/dL (ref 12.0–15.0)
Immature Granulocytes: 1 %
Lymphocytes Relative: 16 %
Lymphs Abs: 0.6 10*3/uL — ABNORMAL LOW (ref 0.7–4.0)
MCH: 32.1 pg (ref 26.0–34.0)
MCHC: 33.3 g/dL (ref 30.0–36.0)
MCV: 96.4 fL (ref 80.0–100.0)
Monocytes Absolute: 0.2 10*3/uL (ref 0.1–1.0)
Monocytes Relative: 6 %
Neutro Abs: 2.7 10*3/uL (ref 1.7–7.7)
Neutrophils Relative %: 73 %
Platelets: 54 10*3/uL — ABNORMAL LOW (ref 150–400)
RBC: 4.21 MIL/uL (ref 3.87–5.11)
RDW: 14.4 % (ref 11.5–15.5)
WBC: 3.7 10*3/uL — ABNORMAL LOW (ref 4.0–10.5)
nRBC: 0 % (ref 0.0–0.2)

## 2021-07-29 LAB — BASIC METABOLIC PANEL
Anion gap: 9 (ref 5–15)
BUN: 5 mg/dL — ABNORMAL LOW (ref 6–20)
CO2: 25 mmol/L (ref 22–32)
Calcium: 8.7 mg/dL — ABNORMAL LOW (ref 8.9–10.3)
Chloride: 96 mmol/L — ABNORMAL LOW (ref 98–111)
Creatinine, Ser: 0.64 mg/dL (ref 0.44–1.00)
GFR, Estimated: >60 mL/min (ref >60)
Glucose, Bld: 617 mg/dL (ref 70–99)
Potassium: 4 mmol/L (ref 3.5–5.1)
Sodium: 130 mmol/L — ABNORMAL LOW (ref 135–145)

## 2021-07-29 LAB — CBC
HCT: 44 % (ref 36.0–46.0)
Hemoglobin: 14.3 g/dL (ref 12.0–15.0)
MCH: 30.9 pg (ref 26.0–34.0)
MCHC: 32.5 g/dL (ref 30.0–36.0)
MCV: 95 fL (ref 80.0–100.0)
Platelets: 59 10*3/uL — ABNORMAL LOW (ref 150–400)
RBC: 4.63 MIL/uL (ref 3.87–5.11)
RDW: 14.3 % (ref 11.5–15.5)
WBC: 4 10*3/uL (ref 4.0–10.5)
nRBC: 0 % (ref 0.0–0.2)

## 2021-07-29 LAB — CBG MONITORING, ED
Glucose-Capillary: >600 mg/dL (ref 70–99)
Glucose-Capillary: 362 mg/dL — ABNORMAL HIGH (ref 70–99)
Glucose-Capillary: 292 mg/dL — ABNORMAL HIGH (ref 70–99)
Glucose-Capillary: 200 mg/dL — ABNORMAL HIGH (ref 70–99)
Glucose-Capillary: 222 mg/dL — ABNORMAL HIGH (ref 70–99)
Glucose-Capillary: 179 mg/dL — ABNORMAL HIGH (ref 70–99)

## 2021-07-29 LAB — PROTIME-INR
INR: 1.1 (ref 0.8–1.2)
Prothrombin Time: 14.6 seconds (ref 11.4–15.2)

## 2021-07-29 LAB — HCG, QUANTITATIVE, PREGNANCY: hCG, Beta Chain, Quant, S: <1 m[IU]/mL (ref <5)

## 2021-07-29 LAB — BETA-HYDROXYBUTYRIC ACID: Beta-Hydroxybutyric Acid: 0.09 mmol/L (ref 0.05–0.27)

## 2021-07-29 LAB — HCG, SERUM, QUALITATIVE: Preg, Serum: NEGATIVE

## 2021-07-29 MED ORDER — LACTATED RINGERS IV SOLN: INTRAVENOUS | Status: DC

## 2021-07-29 MED ORDER — DEXTROSE 50 % IV SOLN: 0.0000 mL | INTRAVENOUS | Status: DC | PRN

## 2021-07-29 MED ORDER — DEXTROSE IN LACTATED RINGERS 5 % IV SOLN: INTRAVENOUS | Status: DC

## 2021-07-29 MED ORDER — FENTANYL CITRATE PF 50 MCG/ML IJ SOSY
50.0000 ug | PREFILLED_SYRINGE | Freq: Once | INTRAMUSCULAR | Status: AC
Start: 2021-07-29 — End: 2021-07-29
  Administered 2021-07-29: 50 ug via INTRAVENOUS
  Filled 2021-07-29: qty 1

## 2021-07-29 MED ORDER — FENTANYL CITRATE PF 50 MCG/ML IJ SOSY
50.0000 ug | PREFILLED_SYRINGE | Freq: Once | INTRAMUSCULAR | Status: AC
  Administered 2021-07-29: 50 ug via INTRAVENOUS
  Filled 2021-07-29: qty 1

## 2021-07-29 MED ORDER — SODIUM CHLORIDE 0.9 % IV BOLUS
1000.0000 mL | Freq: Once | INTRAVENOUS | Status: AC
  Administered 2021-07-29: 1000 mL via INTRAVENOUS

## 2021-07-29 MED ORDER — INSULIN REGULAR(HUMAN) IN NACL 100-0.9 UT/100ML-% IV SOLN
INTRAVENOUS | Status: DC
  Administered 2021-07-29: 19 [IU]/h via INTRAVENOUS
  Filled 2021-07-29: qty 100

## 2021-07-29 MED ORDER — INSULIN ASPART 100 UNIT/ML IJ SOLN
10.0000 [IU] | Freq: Once | INTRAMUSCULAR | Status: AC
  Administered 2021-07-29: 10 [IU] via SUBCUTANEOUS
  Filled 2021-07-29 (×2): qty 1

## 2021-07-29 NOTE — ED Notes (Signed)
Date and time results received: 07/29/21 2:23 PM  Test: blood glucose Critical Value: 617  Name of Provider Notified: Dr. Laverta Baltimore  Orders Received? Or Actions Taken?: see orders

## 2021-07-29 NOTE — ED Notes (Signed)
Took pts blood sugar in triage, it is over 600. RN notified.

## 2021-07-29 NOTE — Patient Instructions (Signed)

## 2021-07-29 NOTE — Discharge Instructions (Addendum)
Your labs today are reassuring and you have been treated for a significant hyperglycemia.  Your final blood sugar is much better at 179.  Keep a close watch on your blood glucose levels and your diet.  Keep your appointment with your endocrinologist tomorrow and your appointment with Dr. Jenetta Downer on Friday for your endoscopy and colonoscopy.  Your CT scan is reassuring with no obvious source of your abdominal pain.

## 2021-07-29 NOTE — Pre-Procedure Instructions (Signed)
Patient was called and instructed to go to the ED due to glucose of 613. She verbalized understanding of this.

## 2021-07-29 NOTE — ED Provider Notes (Signed)
[facility] EMERGENCY DEPARTMENT Provider Note   CSN: [id number] Arrival date & time: 07/29/21  1224     History Chief Complaint  Patient presents with   Hyperglycemia    Jaime [name] is a 35 y.o. female with a history significant for type 2 diabetes, hepatitis C causing cirrhosis, chronic abdominal pain, lupus.  And also chronic rectal bleeding for which she was here yesterday for preop lab work in anticipation of endoscopy and colonoscopy scheduled for Friday secondary to chronic rectal bleeding along with dysphagia, 3 days from now with Dr. Jenetta Downer.  She was called this morning and told to come to the emergency room as her blood glucose level was over 600.  She does endorse increased abdominal pain and distention over the past several days.  She has had no diarrhea but does endorse nausea with 2 episodes of vomiting this morning.  She has been afebrile.  She has been compliant with her diabetes medication and denies any changes in her diet. She has had no fevers or chills, dysuria or hematuria.    The history is provided by the patient.      Past Medical History:  Diagnosis Date   Anxiety    Asthma    Chronic abdominal pain    Chronic back pain    Cirrhosis ([place])    Cirrhosis ([place])    COPD (chronic obstructive pulmonary disease) ([place])    Depression    Diabetes mellitus without complication ([place])    Diabetes mellitus, type II ([place])    Hepatitis C    Insomnia    Long-term current use of methadone for opiate dependence ([place])    Lupus ([place])    Migraine headache    Neuropathy    Nocturnal seizures ([place])    Peptic ulcer    Spleen enlarged     Patient Active Problem List   Diagnosis Date Noted   Rectal bleeding 07/28/2021   Esophageal dysphagia 07/28/2021   Melena 07/28/2021   Pain of upper abdomen 07/28/2021   Non-intractable vomiting 07/28/2021   Adjustment disorder with depressed mood 09/07/2020   Respiratory failure ([place]) 08/31/2020   Acute encephalopathy  08/31/2020   Hyponatremia 08/16/2020   Volume overload 08/14/2020   Dyspnea 07/29/2020   Nexplanon insertion 05/20/2020   Nexplanon removal 05/20/2020   Pressure injury of skin 01/29/2019   Uncontrolled diabetes mellitus ([place]) 09/19/2018   Hyperglycemia 09/17/2018   Acute respiratory failure with hypoxia ([place]) 09/15/2018   Anxiety 09/15/2018   Chronic abdominal pain 09/15/2018   Diabetes mellitus without complication ([place]) 62/70/3500   Hepatitis C 09/15/2018   Peptic ulcer 09/15/2018   Long-term current use of methadone for opiate dependence ([place]) 09/15/2018   Cirrhosis ([place]) 09/15/2018   Acute hypoxemic respiratory failure ([place]) 07/17/2016   Pleural effusion on right 07/17/2016   Multiple rib fractures involving four or more ribs 07/17/2016   Hemothorax on right 07/17/2016   Chronic pain disorder 07/17/2016    Past Surgical History:  Procedure Laterality Date   CHOLECYSTECTOMY     ESOPHAGOGASTRODUODENOSCOPY  06/2020   done at [facility], candida in upper esophagus (treated with diflucan), ulcerative esophagitis at GE junction, gastritis in stomach, single ulcer in duodenal bulb, with duodenal mucosa showing no abnormality. No presence of varices     OB History     Gravida  0   Para  0   Term  0   Preterm  0   AB  0   Living  0  SAB  0   IAB  0   Ectopic  0   Multiple  0   Live Births  0           Family History  Problem Relation Age of Onset   Hypertension Mother    Hyperlipidemia Mother    Hyperlipidemia Maternal Grandfather     Social History   Tobacco Use   Smoking status: Every Day    Types: E-cigarettes   Smokeless tobacco: Never  Vaping Use   Vaping Use: Every day   Substances: Nicotine, Flavoring  Substance Use Topics   Alcohol use: No   Drug use: No    Home Medications Prior to Admission medications   Medication Sig Start Date End Date Taking? Authorizing Provider  Accu-Chek Softclix Lancets lancets 4 (four) times daily.  07/07/20   [provider]  albuterol (ACCUNEB) 0.63 MG/3ML nebulizer solution Take by nebulization 4 (four) times daily. 07/27/20   [provider]  albuterol (PROVENTIL) (2.5 MG/3ML) 0.083% nebulizer solution Take 2.5 mg by nebulization every 6 (six) hours as needed for wheezing or shortness of breath.    [provider]  ARIPiprazole (ABILIFY) 5 MG tablet Take 5 mg by mouth daily. 06/20/21   [provider]  aspirin EC 81 MG tablet Take 162 mg by mouth daily. Swallow whole.    [provider]  cetirizine (ZYRTEC ALLERGY) 10 MG tablet Take 1 tablet (10 mg total) by mouth daily. 07/09/20   Avegno, Darrelyn Hillock, FNP  clonazePAM (KLONOPIN) 0.5 MG tablet Take 1 tablet (0.5 mg total) by mouth 2 (two) times daily as needed for anxiety. 09/09/20 07/28/21  Shelly Coss, MD  Continuous Blood Gluc Sensor (DEXCOM G6 SENSOR) MISC 4 Pieces by Does not apply route once a week. 07/16/21   Brita Romp, NP  Continuous Blood Gluc Transmit (DEXCOM G6 TRANSMITTER) MISC 1 Piece by Does not apply route as directed. 02/05/21   Brita Romp, NP  cromolyn (OPTICROM) 4 % ophthalmic solution 1 drop 2 (two) times daily. 01/28/21   [provider]  etonogestrel (NEXPLANON) 68 MG IMPL implant 1 each by Subdermal route once.    [provider]  furosemide (LASIX) 40 MG tablet Take 80 mg by mouth daily.  05/22/20   [provider]  glipiZIDE (GLUCOTROL XL) 5 MG 24 hr tablet Take 1 tablet (5 mg total) by mouth daily with breakfast. 02/26/21   Brita Romp, NP  GLOBAL EASE INJECT PEN NEEDLES 31G X 8 MM MISC USE 1 PEN NEEDLE THREE TIMES DAILY. 06/30/21   Brita Romp, NP  glucose blood (ACCU-CHEK GUIDE) test strip Use as instructed to check blood glucose four times daily 05/20/21   Brita Romp, NP  hyoscyamine (ANASPAZ) 0.125 MG TBDP disintergrating tablet Place 1 tablet (0.125 mg total) under the tongue 3 (three) times daily as needed for  bladder spasms or cramping (Patient Preference). 07/28/21   Carlan, Chelsea L, NP  insulin isophane & regular human KwikPen (HUMULIN 70/30 MIX) (70-30) 100 UNIT/ML KwikPen Inject 90 Units into the skin 2 (two) times daily. Inject 90 units with breakfast and 80 units with supper 05/28/21   Brita Romp, NP  Insulin Pen Needle (PEN NEEDLES) 32G X 6 MM MISC 1 each by Does not apply route 3 (three) times daily. 02/26/21   Brita Romp, NP  LACTULOSE PO Take by mouth. 10gm/15 ml - 30 mls  three times a day    [provider]  liraglutide (VICTOZA) 18 MG/3ML SOPN Inject 1.8 mg into the skin daily. 02/26/21   Brita Romp, NP  lisinopril (ZESTRIL) 5 MG tablet TAKE 1 TABLET ONCE DAILY. 06/30/21   Brita Romp, NP  metFORMIN (GLUCOPHAGE) 1000 MG tablet Take 1 tablet (1,000 mg total) by mouth 2 (two) times daily with a meal. 11/27/20   Brita Romp, NP  methadone (DOLOPHINE) 10 MG/5ML solution Take 60 mg by mouth every morning. 72m    [provider]  Multiple Vitamin (MULTIVITAMIN) tablet Take 1 tablet by mouth daily.    [provider]  nystatin (MYCOSTATIN/NYSTOP) powder Apply 1 application topically 3 (three) times daily. 05/19/21   Mesner, JCorene Cornea MD  omeprazole (PRILOSEC) 40 MG capsule Take 1 capsule (40 mg total) by mouth daily. 07/28/21   Carlan, Chelsea L, NP  ondansetron (ZOFRAN-ODT) 4 MG disintegrating tablet Take 4 mg by mouth every 8 (eight) hours as needed for nausea.  06/14/20   [provider]  oxyCODONE (ROXICODONE) 5 MG immediate release tablet Take 1 tablet (5 mg total) by mouth every 12 (twelve) hours as needed for breakthrough pain or severe pain. 05/19/21   Mesner, JCorene Cornea MD  potassium chloride (KLOR-CON) 10 MEQ tablet Take 20 mEq by mouth daily.  04/24/20   [provider]  pregabalin (LYRICA) 75 MG capsule Take 75 mg by mouth in the morning, at noon, in the evening, and at bedtime.     [provider]  PROAIR HFA  108 ([phone]) MCG/ACT inhaler SMARTSIG:2 Puff(s) By Mouth Every 6 Hours PRN 04/04/21   [provider]  propranolol (INDERAL) 10 MG tablet Take 10 mg by mouth 3 (three) times daily. 08/08/20   [provider]  RESTASIS 0.05 % ophthalmic emulsion Place 1 drop into both eyes as needed (dry eye).  04/22/19   [provider]  rosuvastatin (CRESTOR) 5 MG tablet Take 1 tablet (5 mg total) by mouth daily. 08/06/20   RBrita Romp NP  spironolactone (ALDACTONE) 100 MG tablet Take 100 mg by mouth daily.  05/22/20   [provider]  sucralfate (CARAFATE) 1 g tablet Take 1 g by mouth 4 (four) times daily. 07/02/20   [provider]  triamcinolone (KENALOG) 0.1 % Apply topically daily. 02/04/21   [provider]  venlafaxine XR (EFFEXOR-XR) 150 MG 24 hr capsule Take total of 225 mg daily (150 mg + 75 mg ) 01/24/21   HNorman Clay MD  venlafaxine XR (EFFEXOR-XR) 75 MG 24 hr capsule To take along with 1547mfor a total of 225 mg daily 01/20/21   HiNorman ClayMD    Allergies    Iodine-131, Ivp dye [iodinated diagnostic agents], Ketorolac tromethamine, Tylenol [acetaminophen], Gabapentin, Morphine and related, Nsaids, Suboxone [buprenorphine hcl-naloxone hcl], and Vancomycin  Review of Systems   Review of Systems  Constitutional:  Negative for chills and fever.  HENT:  Negative for congestion and sore throat.   Eyes: Negative.   Respiratory:  Negative for chest tightness and shortness of breath.   Cardiovascular:  Negative for chest pain.  Gastrointestinal:  Positive for abdominal distention, abdominal pain, nausea and vomiting. Negative for diarrhea.  Endocrine: Positive for polydipsia. Negative for polyuria.  Genitourinary: Negative.  Negative for dysuria.  Musculoskeletal:  Negative for arthralgias, joint swelling and neck pain.  Skin: Negative.  Negative for rash and wound.  Neurological:  Negative for dizziness, weakness, light-headedness, numbness  and headaches.  Psychiatric/Behavioral: Negative.     Physical Exam  Updated Vital Signs BP 131/87   Pulse 98   Temp 98.4 F (36.9 C)   Resp 16   Ht 5' 10"  (1.778 m)   Wt 133.8 kg   SpO2 93%   BMI 42.33 kg/m   Physical Exam Vitals and nursing note reviewed.  Constitutional:      Appearance: She is well-developed.  HENT:     Head: Normocephalic and atraumatic.  Eyes:     Conjunctiva/sclera: Conjunctivae normal.  Cardiovascular:     Rate and Rhythm: Normal rate and regular rhythm.     Heart sounds: Normal heart sounds.  Pulmonary:     Effort: Pulmonary effort is normal.     Breath sounds: Normal breath sounds. No wheezing.  Abdominal:     General: Bowel sounds are normal. There is distension.     Palpations: Abdomen is soft.     Tenderness: There is abdominal tenderness. There is no guarding or rebound.     Comments: Generalized abd pain without localization.  Increased tympany throughout, BS present.  No guarding.   Musculoskeletal:        General: Normal range of motion.     Cervical back: Normal range of motion.  Skin:    General: Skin is warm and dry.  Neurological:     Mental Status: She is alert.    ED Results / Procedures / Treatments   Labs (all labs ordered are listed, but only abnormal results are displayed) Labs Reviewed  BASIC METABOLIC PANEL - Abnormal; Notable for the following components:      Result Value   Sodium 130 (*)    Chloride 96 (*)    Glucose, Bld 617 (*)    BUN 5 (*)    Calcium 8.7 (*)    All other components within normal limits  CBC - Abnormal; Notable for the following components:   Platelets 59 (*)    All other components within normal limits  URINALYSIS, ROUTINE W REFLEX MICROSCOPIC - Abnormal; Notable for the following components:   Glucose, UA >=500 (*)    Hgb urine dipstick LARGE (*)    Leukocytes,Ua SMALL (*)    Bacteria, UA RARE (*)    All other components within normal limits  HEPATIC FUNCTION PANEL - Abnormal; Notable  for the following components:   Albumin 3.2 (*)    AST 44 (*)    ALT 45 (*)    Alkaline Phosphatase 168 (*)    Bilirubin, Direct 0.3 (*)    All other components within normal limits  CBG MONITORING, ED - Abnormal; Notable for the following components:   Glucose-Capillary >600 (*)    All other components within normal limits  CBG MONITORING, ED - Abnormal; Notable for the following components:   Glucose-Capillary 362 (*)    All other components within normal limits  CBG MONITORING, ED - Abnormal; Notable for the following components:   Glucose-Capillary 292 (*)    All other components within normal limits  CBG MONITORING, ED - Abnormal; Notable for the following components:   Glucose-Capillary 222 (*)    All other components within normal limits  CBG MONITORING, ED - Abnormal; Notable for the following components:   Glucose-Capillary 179 (*)    All other components within normal limits  CBG MONITORING, ED - Abnormal; Notable for the following components:   Glucose-Capillary 200 (*)    All other components within normal limits  HCG, QUANTITATIVE, PREGNANCY  BETA-HYDROXYBUTYRIC ACID  HEMOGLOBIN A1C    EKG None  Radiology CT ABDOMEN PELVIS WO CONTRAST  Result Date: 07/29/2021 CLINICAL DATA:  Abdominal distension, cirrhosis EXAM: CT ABDOMEN AND PELVIS WITHOUT CONTRAST TECHNIQUE: Multidetector CT imaging of the abdomen and pelvis was performed following the standard protocol without IV contrast. Unenhanced CT was performed per clinician order. Lack of IV contrast limits sensitivity and specificity, especially for evaluation of abdominal/pelvic solid viscera. COMPARISON:  12/04/2019 FINDINGS: Lower chest: Linear consolidation within the lingula most consistent with subsegmental atelectasis. No airspace disease, effusion, or pneumothorax. Hepatobiliary: Limited unenhanced evaluation of the liver demonstrates diffuse parenchymal heterogeneity and nodularity of the liver capsule consistent  with known cirrhosis. No intrahepatic duct dilation. The gallbladder is surgically absent. Pancreas: Unremarkable. No pancreatic ductal dilatation or surrounding inflammatory changes. Spleen: Stable splenomegaly consistent with portal venous hypertension. Adrenals/Urinary Tract: No urinary tract calculi or obstructive uropathy. The adrenals and bladder are unremarkable. Stomach/Bowel: No bowel obstruction or ileus. Normal appendix. No bowel wall thickening or inflammatory change. Vascular/Lymphatic: No significant atherosclerosis. Numerous collaterals are seen throughout the mesentery, splenic hilum, and abdominal wall consistent with portal venous hypertension. Stable borderline enlarged lymph node at the porta hepatis measuring 13 mm. No other pathologic adenopathy. Reproductive: Uterus and bilateral adnexa are unremarkable. Other: No free fluid or free intraperitoneal gas. No abdominal wall hernia. Musculoskeletal: No acute or destructive bony lesions. Reconstructed images demonstrate no additional findings. IMPRESSION: 1. Stable findings of cirrhosis and portal venous hypertension, manifested by extensive varices and splenomegaly. 2. Subsegmental atelectasis within the lingula. No acute airspace disease. 3. Stable borderline adenopathy at the porta hepatis. 4. Otherwise unremarkable unenhanced CT of the abdomen and pelvis. Electronically Signed   By: Randa Ngo M.D.   On: 07/29/2021 17:17    Procedures Procedures   Medications Ordered in ED Medications  insulin regular, human (MYXREDLIN) 100 units/ 100 mL infusion (0 Units/hr Intravenous Stopped 07/29/21 1906)  lactated ringers infusion (0 mLs Intravenous Stopped 07/29/21 1906)  dextrose 5 % in lactated ringers infusion (0 mLs Intravenous Stopped 07/29/21 1906)  dextrose 50 % solution 0-50 mL (has no administration in time range)  sodium chloride 0.9 % bolus 1,000 mL ( Intravenous Stopped 07/29/21 1549)  insulin aspart (novoLOG) injection 10 Units  (10 Units Subcutaneous Given 07/29/21 1426)  fentaNYL (SUBLIMAZE) injection 50 mcg (50 mcg Intravenous Given 07/29/21 1430)  fentaNYL (SUBLIMAZE) injection 50 mcg (50 mcg Intravenous Given 07/29/21 1839)    ED Course  I have reviewed the triage vital signs and the nursing notes.  Pertinent labs & imaging results that were available during my care of the patient were reviewed by me and considered in my medical decision making (see chart for details).  Clinical Course as of 07/29/21 2302  Tue Jul 29, 2021  1659 Patient with difficult abdominal exam given body habitus.  She does have increased tympany and endorses generalized pain and distention.  Surgical history significant for a distant cholecystectomy.  She does have risk for possible SBO, CT imaging was performed to assess for this possibility. [JI]  1700 Labs reviewed, patient is not in DKA although she is very hyperglycemic.  The Endo tool was enlisted to treat her hyperglycemia. [JI]    Clinical Course User Index [JI] Landis Martins   MDM Rules/Calculators/A&P                           Labs and imaging reviewed and discussed with patient.  She has no acute intra-abdominal findings.  She is scheduled for  endoscopy and colonoscopy with Dr. Jenetta Downer in 3 days, she was encouraged to keep this appointment.  She was also treated for her severe hyperglycemia.  The Endo tool was utilized and her glucose improved from 6 17-1 79 at time of discharge.  She had no DKA.  Her beta hydroxy butyrate acid was normal range.  She had no nausea or vomiting while here.  LFTs are slightly bumped today but is chronic for her and review chart.  She was stable at time of discharge.  She does endorse that she actually has an appointment tomorrow with her endocrinologist as well, encouraged to keep this appointment. Final Clinical Impression(s) / ED Diagnoses Final diagnoses:  Hyperglycemia  Chronic abdominal pain    Rx / DC Orders ED Discharge Orders      None        Landis Martins 07/29/21 2304    Margette Fast, MD 08/01/21 1110

## 2021-07-29 NOTE — ED Triage Notes (Signed)
Elevated blood sugar

## 2021-07-30 ENCOUNTER — Other Ambulatory Visit: Payer: Self-pay

## 2021-07-30 ENCOUNTER — Telehealth: Payer: Self-pay

## 2021-07-30 ENCOUNTER — Encounter: Payer: Medicaid Other | Attending: Nurse Practitioner | Admitting: Nutrition

## 2021-07-30 ENCOUNTER — Encounter: Payer: Self-pay | Admitting: Nutrition

## 2021-07-30 ENCOUNTER — Encounter: Payer: Self-pay | Admitting: Nurse Practitioner

## 2021-07-30 ENCOUNTER — Encounter (INDEPENDENT_AMBULATORY_CARE_PROVIDER_SITE_OTHER): Payer: Self-pay

## 2021-07-30 ENCOUNTER — Ambulatory Visit (INDEPENDENT_AMBULATORY_CARE_PROVIDER_SITE_OTHER): Payer: Medicaid Other | Admitting: Nurse Practitioner

## 2021-07-30 VITALS — BP 123/72 | HR 92 | Ht 69.0 in | Wt 300.2 lb

## 2021-07-30 DIAGNOSIS — E782 Mixed hyperlipidemia: Secondary | ICD-10-CM | POA: Diagnosis present

## 2021-07-30 DIAGNOSIS — Z9111 Patient's noncompliance with dietary regimen: Secondary | ICD-10-CM | POA: Diagnosis not present

## 2021-07-30 DIAGNOSIS — I10 Essential (primary) hypertension: Secondary | ICD-10-CM | POA: Insufficient documentation

## 2021-07-30 DIAGNOSIS — E1165 Type 2 diabetes mellitus with hyperglycemia: Secondary | ICD-10-CM

## 2021-07-30 DIAGNOSIS — IMO0002 Reserved for concepts with insufficient information to code with codable children: Secondary | ICD-10-CM

## 2021-07-30 DIAGNOSIS — K7460 Unspecified cirrhosis of liver: Secondary | ICD-10-CM | POA: Insufficient documentation

## 2021-07-30 DIAGNOSIS — E118 Type 2 diabetes mellitus with unspecified complications: Secondary | ICD-10-CM | POA: Diagnosis present

## 2021-07-30 DIAGNOSIS — Z91119 Patient's noncompliance with dietary regimen due to unspecified reason: Secondary | ICD-10-CM

## 2021-07-30 LAB — HEMOGLOBIN A1C
Hgb A1c MFr Bld: 11.4 % — ABNORMAL HIGH (ref 4.8–5.6)
Mean Plasma Glucose: 280 mg/dL

## 2021-07-30 MED ORDER — DEXCOM G6 SENSOR MISC: 4.0000 | 2 refills | Status: DC

## 2021-07-30 MED ORDER — VICTOZA 18 MG/3ML ~~LOC~~ SOPN: 1.8000 mg | PEN_INJECTOR | Freq: Every day | SUBCUTANEOUS | 3 refills | Status: DC

## 2021-07-30 MED ORDER — LYUMJEV KWIKPEN 200 UNIT/ML ~~LOC~~ SOPN: 20.0000 [IU] | PEN_INJECTOR | Freq: Three times a day (TID) | SUBCUTANEOUS | 3 refills | Status: DC

## 2021-07-30 MED ORDER — GLIPIZIDE ER 5 MG PO TB24: 5.0000 mg | ORAL_TABLET | Freq: Every day | ORAL | 3 refills | Status: DC

## 2021-07-30 MED ORDER — TOUJEO MAX SOLOSTAR 300 UNIT/ML ~~LOC~~ SOPN: 80.0000 [IU] | PEN_INJECTOR | Freq: Every day | SUBCUTANEOUS | 3 refills | Status: DC

## 2021-07-30 MED ORDER — METFORMIN HCL 1000 MG PO TABS: 1000.0000 mg | ORAL_TABLET | Freq: Two times a day (BID) | ORAL | 3 refills | Status: DC

## 2021-07-30 NOTE — Progress Notes (Signed)
07/30/2021, 4:24 PM     Endocrinology Follow Up Visit  Subjective:    Patient ID: Jaime [name], female    DOB: Jan 01, 1986.  Jaime [name] is being seen in follow up for management of currently uncontrolled symptomatic diabetes requested by  Neale Burly, MD.   Past Medical History:  Diagnosis Date   Anxiety    Asthma    Chronic abdominal pain    Chronic back pain    Cirrhosis ([place])    Cirrhosis ([place])    COPD (chronic obstructive pulmonary disease) ([place])    Depression    Diabetes mellitus without complication ([place])    Diabetes mellitus, type II ([place])    Hepatitis C    Insomnia    Long-term current use of methadone for opiate dependence ([place])    Lupus ([place])    Migraine headache    Neuropathy    Nocturnal seizures ([place])    Peptic ulcer    Spleen enlarged     Past Surgical History:  Procedure Laterality Date   CHOLECYSTECTOMY     ESOPHAGOGASTRODUODENOSCOPY  06/2020   done at [facility], candida in upper esophagus (treated with diflucan), ulcerative esophagitis at GE junction, gastritis in stomach, single ulcer in duodenal bulb, with duodenal mucosa showing no abnormality. No presence of varices    Social History   Socioeconomic History   Marital status: Single    Spouse name: Not on file   Number of children: Not on file   Years of education: Not on file   Highest education level: Not on file  Occupational History   Not on file  Tobacco Use   Smoking status: Every Day    Types: E-cigarettes   Smokeless tobacco: Never  Vaping Use   Vaping Use: Every day   Substances: Nicotine, Flavoring  Substance and Sexual Activity   Alcohol use: No   Drug use: No   Sexual activity: Not Currently    Birth control/protection: Implant  Other Topics Concern   Not on file  Social History Narrative   Not on file   Social Determinants of Health   Financial Resource Strain: Not on file   Food Insecurity: Not on file  Transportation Needs: Not on file  Physical Activity: Not on file  Stress: Not on file  Social Connections: Not on file    Family History  Problem Relation Age of Onset   Hypertension Mother    Hyperlipidemia Mother    Hyperlipidemia Maternal Grandfather     Outpatient Encounter Medications as of 07/30/2021  Medication Sig   Accu-Chek Softclix Lancets lancets 4 (four) times daily.   albuterol (ACCUNEB) 0.63 MG/3ML nebulizer solution Take by nebulization 4 (four) times daily.   albuterol (PROVENTIL) (2.5 MG/3ML) 0.083% nebulizer solution Take 2.5 mg by nebulization every 6 (six) hours as needed for wheezing or shortness of breath.   ARIPiprazole (ABILIFY) 5 MG tablet Take 5 mg by mouth daily.   aspirin EC 81 MG tablet Take 162 mg by mouth daily. Swallow whole.   cetirizine (ZYRTEC ALLERGY) 10 MG tablet Take 1 tablet (10 mg total) by mouth daily.   Continuous  Blood Gluc Transmit (DEXCOM G6 TRANSMITTER) MISC 1 Piece by Does not apply route as directed.   cromolyn (OPTICROM) 4 % ophthalmic solution 1 drop 2 (two) times daily.   etonogestrel (NEXPLANON) 68 MG IMPL implant 1 each by Subdermal route once.   furosemide (LASIX) 40 MG tablet Take 80 mg by mouth daily.    GLOBAL EASE INJECT PEN NEEDLES 31G X 8 MM MISC USE 1 PEN NEEDLE THREE TIMES DAILY.   glucose blood (ACCU-CHEK GUIDE) test strip Use as instructed to check blood glucose four times daily   hyoscyamine (ANASPAZ) 0.125 MG TBDP disintergrating tablet Place 1 tablet (0.125 mg total) under the tongue 3 (three) times daily as needed for bladder spasms or cramping (Patient Preference).   insulin glargine, 2 Unit Dial, (TOUJEO MAX SOLOSTAR) 300 UNIT/ML Solostar Pen Inject 80 Units into the skin at bedtime.   Insulin Lispro-aabc (LYUMJEV KWIKPEN) 200 UNIT/ML KwikPen Inject 20-26 Units into the skin 3 (three) times daily with meals.   Insulin Pen Needle (PEN NEEDLES) 32G X 6 MM MISC 1 each by Does not apply  route 3 (three) times daily.   LACTULOSE PO Take by mouth. 10gm/15 ml - 30 mls  three times a day   lisinopril (ZESTRIL) 5 MG tablet TAKE 1 TABLET ONCE DAILY.   methadone (DOLOPHINE) 10 MG/5ML solution Take 60 mg by mouth every morning. 29m   Multiple Vitamin (MULTIVITAMIN) tablet Take 1 tablet by mouth daily.   nystatin (MYCOSTATIN/NYSTOP) powder Apply 1 application topically 3 (three) times daily.   omeprazole (PRILOSEC) 40 MG capsule Take 1 capsule (40 mg total) by mouth daily.   ondansetron (ZOFRAN-ODT) 4 MG disintegrating tablet Take 4 mg by mouth every 8 (eight) hours as needed for nausea.    oxyCODONE (ROXICODONE) 5 MG immediate release tablet Take 1 tablet (5 mg total) by mouth every 12 (twelve) hours as needed for breakthrough pain or severe pain.   potassium chloride (KLOR-CON) 10 MEQ tablet Take 20 mEq by mouth daily.    pregabalin (LYRICA) 75 MG capsule Take 75 mg by mouth in the morning, at noon, in the evening, and at bedtime.    PROAIR HFA 108 (90 Base) MCG/ACT inhaler SMARTSIG:2 Puff(s) By Mouth Every 6 Hours PRN   propranolol (INDERAL) 10 MG tablet Take 10 mg by mouth 3 (three) times daily.   RESTASIS 0.05 % ophthalmic emulsion Place 1 drop into both eyes as needed (dry eye).    rosuvastatin (CRESTOR) 5 MG tablet Take 1 tablet (5 mg total) by mouth daily.   spironolactone (ALDACTONE) 100 MG tablet Take 100 mg by mouth daily.    sucralfate (CARAFATE) 1 g tablet Take 1 g by mouth 4 (four) times daily.   triamcinolone (KENALOG) 0.1 % Apply topically daily.   venlafaxine XR (EFFEXOR-XR) 150 MG 24 hr capsule Take total of 225 mg daily (150 mg + 75 mg )   venlafaxine XR (EFFEXOR-XR) 75 MG 24 hr capsule To take along with 1545mfor a total of 225 mg daily   [DISCONTINUED] Continuous Blood Gluc Sensor (DEXCOM G6 SENSOR) MISC 4 Pieces by Does not apply route once a week.   [DISCONTINUED] glipiZIDE (GLUCOTROL XL) 5 MG 24 hr tablet Take 1 tablet (5 mg total) by mouth daily with breakfast.    [DISCONTINUED] insulin isophane & regular human KwikPen (HUMULIN 70/30 MIX) (70-30) 100 UNIT/ML KwikPen Inject 90 Units into the skin 2 (two) times daily. Inject 90 units with breakfast and 80 units with supper   [  DISCONTINUED] liraglutide (VICTOZA) 18 MG/3ML SOPN Inject 1.8 mg into the skin daily.   [DISCONTINUED] metFORMIN (GLUCOPHAGE) 1000 MG tablet Take 1 tablet (1,000 mg total) by mouth 2 (two) times daily with a meal.   clonazePAM (KLONOPIN) 0.5 MG tablet Take 1 tablet (0.5 mg total) by mouth 2 (two) times daily as needed for anxiety.   Continuous Blood Gluc Sensor (DEXCOM G6 SENSOR) MISC 4 Pieces by Does not apply route once a week.   glipiZIDE (GLUCOTROL XL) 5 MG 24 hr tablet Take 1 tablet (5 mg total) by mouth daily with breakfast.   liraglutide (VICTOZA) 18 MG/3ML SOPN Inject 1.8 mg into the skin daily.   metFORMIN (GLUCOPHAGE) 1000 MG tablet Take 1 tablet (1,000 mg total) by mouth 2 (two) times daily with a meal.   No facility-administered encounter medications on file as of 07/30/2021.    ALLERGIES: Allergies  Allergen Reactions   Iodine-131 Anaphylaxis, Shortness Of Breath and Swelling   Ivp Dye [Iodinated Diagnostic Agents] Anaphylaxis    Skin gets very red, unable to walk    Ketorolac Tromethamine Shortness Of Breath   Tylenol [Acetaminophen] Other (See Comments)    Due to cirrhosis    Gabapentin    Morphine And Related     Pt says she is not allergic to Morphine 08/12/20   Nsaids Other (See Comments)    Flares ulcers   Suboxone [Buprenorphine Hcl-Naloxone Hcl]    Vancomycin Swelling    Facial swelling,     VACCINATION STATUS: Immunization History  Administered Date(s) Administered   Influenza,inj,Quad PF,6+ Mos 07/18/2016   Moderna Sars-Covid-2 Vaccination 03/07/2020, 04/04/2020   Pneumococcal Polysaccharide-23 07/18/2016, 08/01/2020    Diabetes She presents for her follow-up diabetic visit. She has type 2 diabetes mellitus. Onset time: She was diagnosed at  approximate age of 26 years. Her disease course has been worsening. There are no hypoglycemic associated symptoms. Pertinent negatives for hypoglycemia include no headaches, nervousness/anxiousness, pallor, sweats or tremors. Associated symptoms include blurred vision, fatigue, foot paresthesias, polydipsia and polyuria. Pertinent negatives for diabetes include no chest pain, no polyphagia and no weight loss. There are no hypoglycemic complications. (History of unresponsiveness requiring EMS and hospitalization- none recent) Symptoms are stable. Diabetic complications include heart disease and peripheral neuropathy. Risk factors for coronary artery disease include diabetes mellitus, obesity, sedentary lifestyle, dyslipidemia and hypertension. Current diabetic treatment includes insulin injections and oral agent (monotherapy). She is compliant with treatment most of the time. Her weight is fluctuating minimally. She is following a low salt and generally unhealthy diet. When asked about meal planning, she reported none. She has had a previous visit with a dietitian. She participates in exercise intermittently. Her home blood glucose trend is increasing rapidly. Her breakfast blood glucose range is generally >200 mg/dl. Her lunch blood glucose range is generally >200 mg/dl. Her dinner blood glucose range is generally >200 mg/dl. Her bedtime blood glucose range is generally >200 mg/dl. Her overall blood glucose range is >200 mg/dl. (She presents today, accompanied by her mother, with her CGM, no logs showing persistently high glycemic profile in the 400s.  She recently had pre-procedure blood work done which showed glucose over 600 and she was sent to the ED for assistance in bringing it down.  She was started on insulin drip and was sent home with glucose in the 170s.  It has since become elevated again.  She is still not compliant with dietary restrictions, still consuming large quantities of sugary beverages.  ) An  ACE  inhibitor/angiotensin II receptor blocker is being taken. She does not see a podiatrist.Eye exam is not current.  Hypertension This is a chronic problem. The current episode started more than 1 month ago. The problem is unchanged. The problem is controlled. Associated symptoms include blurred vision and peripheral edema. Pertinent negatives include no chest pain, headaches, palpitations, shortness of breath or sweats. There are no associated agents to hypertension. Risk factors for coronary artery disease include diabetes mellitus, dyslipidemia, obesity and sedentary lifestyle. Past treatments include diuretics and beta blockers. The current treatment provides no improvement. There are no compliance problems.     Review of systems  Constitutional: + Minimally fluctuating body weight,  current Body mass index is 44.33 kg/m. , + fatigue, no subjective hyperthermia, no subjective hypothermia Eyes: no blurry vision, no xerophthalmia ENT: no sore throat, no nodules palpated in throat, no dysphagia/odynophagia, no hoarseness Cardiovascular: no chest pain, no shortness of breath, no palpitations, + leg swelling Respiratory: no cough, no shortness of breath Gastrointestinal: no nausea/vomiting/diarrhea Genitourinary: + polyuria Musculoskeletal: no muscle/joint aches Skin: no rashes, no hyperemia Neurological: no tremors, no numbness, no tingling, no dizziness Psychiatric: no depression, no anxiety  Objective:     BP 123/72   Pulse 92   Ht 5' 9"  (1.753 m)   Wt (!) 300 lb 3.2 oz (136.2 kg)   BMI 44.33 kg/m   Wt Readings from Last 3 Encounters:  07/30/21 (!) 300 lb 3.2 oz (136.2 kg)  07/29/21 295 lb 0.1 oz (133.8 kg)  07/29/21 293 lb (132.9 kg)    BP Readings from Last 3 Encounters:  07/30/21 123/72  07/29/21 131/87  07/29/21 131/79     Physical Exam- Limited  Constitutional:  Body mass index is 44.33 kg/m. , not in acute distress, normal state of mind, delayed responses, slurred  speech Eyes:  EOMI, no exophthalmos Neck: Supple Cardiovascular: RRR, no murmurs, rubs, or gallops, no edema Respiratory: Adequate breathing efforts, no crackles, rales, rhonchi, or wheezing Musculoskeletal: no gross deformities, strength intact in all four extremities, no gross restriction of joint movements Skin:  no rashes, no hyperemia Neurological: no tremor with outstretched hands    CMP ( most recent) CMP     Component Value Date/Time   NA 130 (L) 07/29/2021 1310   NA 135 02/19/2021 0822   K 4.0 07/29/2021 1310   CL 96 (L) 07/29/2021 1310   CO2 25 07/29/2021 1310   GLUCOSE 617 (HH) 07/29/2021 1310   BUN 5 (L) 07/29/2021 1310   BUN 6 02/19/2021 0822   CREATININE 0.64 07/29/2021 1310   CALCIUM 8.7 (L) 07/29/2021 1310   PROT 7.7 07/29/2021 1310   PROT 7.0 02/19/2021 0822   ALBUMIN 3.2 (L) 07/29/2021 1310   ALBUMIN 3.6 (L) 02/19/2021 0822   AST 44 (H) 07/29/2021 1310   ALT 45 (H) 07/29/2021 1310   ALKPHOS 168 (H) 07/29/2021 1310   BILITOT 0.9 07/29/2021 1310   BILITOT 0.5 02/19/2021 0822   GFRNONAA >60 07/29/2021 1310   GFRAA >60 08/01/2020 0704     Diabetic Labs (most recent): Lab Results  Component Value Date   HGBA1C 11.4 (H) 07/29/2021   HGBA1C 10.3 (A) 05/28/2021   HGBA1C 8.9 (A) 02/26/2021     Lipid Panel ( most recent) Lipid Panel     Component Value Date/Time   CHOL 210 (H) 07/23/2020 0842   TRIG 86 08/12/2020 1023   HDL 60 07/23/2020 0842   CHOLHDL 3.5 07/23/2020 0842   LDLCALC 116 (H) 07/23/2020 0712  LABVLDL 34 07/23/2020 0842      Lab Results  Component Value Date   TSH 0.635 09/08/2020   TSH 0.993 07/30/2020   TSH 1.990 07/23/2020   TSH 1.690 08/22/2010   FREET4 0.72 (L) 07/23/2020   FREET4 1.11 08/22/2010      Assessment & Plan:   1) Type 2 diabetes mellitus with hyperglycemia, without long-term current use of insulin (HCC)  - Jaime [name] has currently uncontrolled symptomatic type 2 DM since  35 years of age.  She  presents today, accompanied by her mother, with her CGM, no logs showing persistently high glycemic profile in the 400s.  She recently had pre-procedure blood work done which showed glucose over 600 and she was sent to the ED for assistance in bringing it down.  She was started on insulin drip and was sent home with glucose in the 170s.  It has since become elevated again.  She is still not compliant with dietary restrictions, still consuming large quantities of sugary beverages.   Analysis of her CGM shows TIR 6%, TAR 94% (85% in very high range), TBR 0%.  - I had a long discussion with her about the progressive nature of diabetes and the pathology behind its complications.  -her diabetes is complicated by obesity/sedentary life and she remains at a high risk for more acute and chronic complications which include CAD, CVA, CKD, retinopathy, and neuropathy. These are all discussed in detail with her.  - Nutritional counseling repeated at each appointment due to patients tendency to fall back in to old habits.  - The patient admits there is a room for improvement in their diet and drink choices. -  Suggestion is made for the patient to avoid simple carbohydrates from their diet including Cakes, Sweet Desserts / Pastries, Ice Cream, Soda (diet and regular), Sweet Tea, Candies, Chips, Cookies, Sweet Pastries, Store Bought Juices, Alcohol in Excess of 1-2 drinks a day, Artificial Sweeteners, Coffee Creamer, and "Sugar-free" Products. This will help patient to have stable blood glucose profile and potentially avoid unintended weight gain.   - I encouraged the patient to switch to unprocessed or minimally processed complex starch and increased protein intake (animal or plant source), fruits, and vegetables.   - Patient is advised to stick to a routine mealtimes to eat 3 meals a day and avoid unnecessary snacks (to snack only to correct hypoglycemia).  -She sees Kieth Brightly, RDE after her appointment with me  today.  - I have approached her with the following individualized plan to manage  her diabetes and patient agrees:   -Since her recent hospitalization, her mother has assumed responsibility for her diabetes management.  Number 1 priority will be to avoid hypoglycemia for her given her comorbidities.    -Based on her lack of improvement on premixed insulin, will need to switch to basal/bolus regimen.  She is advised to start Toujeo 80 units SQ nightly, start Lyumjev 20-26 units TId with meals if glucose is above 90 and she is eating (Specific instructions on how to titrate insulin dosage based on glucose readings given to patient in writing).  She can continue Victoza 1.8 mg SQ daily, Metformin 1000 mg po twice daily with meals, and Glipizide 5 mg XL daily with breakfast.  We discussed in detail, again, the need for her to comply with dietary restrictions.  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  She could benefit from  seeking out a counselor for positive coping mechanisms and is advised to discuss with her PCP.  Will have her return in 1 week to make further adjustments.  -She is encouraged to continue to monitor glucose 4 times daily, before meals and before bed and log on the clinic sheets provided.  They are instructed to call the clinic if she has readings less than 70 or greater than 300 for 3 tests in a row.     - Specific targets for  A1c;  LDL, HDL,  and Triglycerides were discussed with the patient.  2) Blood Pressure /Hypertension: Her blood pressure is controlled to target.  She is advised to continue Lasix 80 mg po daily, continue Lisinopril 5 mg po daily, continue Propanolol 10 mg po TID, and Aldactone 100 mg o daily  3) Lipids/Hyperlipidemia:   Review of her recent lipid profile from 07/23/20 shows uncontrolled LDL at 116 and elevated triglycerides of 197.  She is advised to continue Crestor 5 mg po daily  at bedtime.  Side effects and precautions discussed with her.  Will recheck lipid panel on subsequent visits.  4)  Weight/Diet:  Her Body mass index is 44.33 kg/m.  -   clearly complicating her diabetes care.  She has recently lost 8 lbs. she is  a candidate for modest weight loss. I discussed with her the fact that loss of 5 - 10% of her  current body weight will have the most impact on her diabetes management.  Exercise, and detailed carbohydrates information provided  -  detailed on discharge instructions.  5) Chronic Care/Health Maintenance: -she is on ACE and statin medications and is encouraged to initiate and continue to follow up with Ophthalmology, Dentist,  Podiatrist at least yearly or according to recommendations, and advised to stay away from smoking. I have recommended yearly flu vaccine and pneumonia vaccine at least every 5 years; moderate intensity exercise for up to 150 minutes weekly; and  sleep for at least 7 hours a day.  - she is advised to maintain close follow up with Hasanaj, Samul Dada, MD for primary care needs, as well as her other providers for optimal and coordinated care.       I spent 46 minutes in the care of the patient today including review of labs from [facility], Lipids, Thyroid Function, Hematology (current and previous including abstractions from other facilities); face-to-face time discussing  her blood glucose readings/logs, discussing hypoglycemia and hyperglycemia episodes and symptoms, medications doses, her options of short and long term treatment based on the latest standards of care / guidelines;  discussion about incorporating lifestyle medicine;  and documenting the encounter.    Please refer to Patient Instructions for Blood Glucose Monitoring and Insulin/Medications Dosing Guide"  in media tab for additional information. Please  also refer to " Patient Self Inventory" in the Media  tab for reviewed elements of pertinent patient history.  Remus Blake  participated in the discussions, expressed understanding, and voiced agreement with the above plans.  All questions were answered to her satisfaction. she is encouraged to contact clinic should she have any questions or concerns prior to her return visit.    Follow up plan: - Return in about 1 week (around 08/06/2021) for Diabetes F/U, Bring meter and logs.    Rayetta Pigg, [facility] [address] [place], [place] [address] Phone: [phone] Fax: [phone]   07/30/2021, 4:24 PM

## 2021-07-30 NOTE — Patient Instructions (Addendum)
Goals  Drink 6 bottles of water per day. No sweet  tea or sodas at all. Take 80 units of Toujeo at night daily. Take 20 units plus sliding scale of meal time insulin. Eat meals on time.

## 2021-07-30 NOTE — Telephone Encounter (Signed)
Patient's mother is calling stating that both new insulin's you prescribed both require a PA. I advised her to call [employer] to send [place] the information.

## 2021-07-30 NOTE — Progress Notes (Signed)
Medical Nutrition Therapy  DM Follow up 1200   End 1230 Primary concerns today: Diabetes Type 2, Obesity Follow up Referral diagnosis: E11.8, E66.01 Preferred learning style:  no preference indicated Learning readiness:  change in progress   NUTRITION ASSESSMENT  Went to hospital last night due to 600  mg/dl BS. Nedra Hai, NP today. Still drinking sweet tea-has about 32 oz. Per day.  Water 3 -16 oz bottles per day Skipped breakfast because she didn't get up in time to eat before she came for her appt. Changing insulin to 80 units TOUJEO and 20 units of LYUMJEV and sliding scale per [place] today. Hasn't scheduled to see therapist yet. She is working on making an appointment.  Has Dexcom. BS 100% above target range. AVG BS 370-400's. A1C 11.4%.  Suppose to have a  colonoscopy this Friday according to her.  Anthropometrics  Wt Readings from Last 3 Encounters:  07/29/21 295 lb 0.1 oz (133.8 kg)  07/29/21 293 lb (132.9 kg)  07/28/21 293 lb (132.9 kg)   Ht Readings from Last 3 Encounters:  07/29/21 5' 10"  (1.778 m)  07/29/21 5' 9"  (1.753 m)  07/28/21 5' 9"  (1.753 m)   There is no height or weight on file to calculate BMI. @BMIFA @ Facility age limit for growth percentiles is 20 years. Facility age limit for growth percentiles is 20 years.   Clinical Medical Hx: Dm Type 2, Obesity, Fatty Liver, Hyperlipidemia, depression Medications: Dexcom, 70/30 90 units in am and 90 units pm. VIctoza daily., Metformin 1000 mg BID and Glipizide. A1C up to 11.4   Labs:  Lab Results  Component Value Date   HGBA1C 11.4 (H) 07/29/2021   CMP Latest Ref Rng & Units 07/29/2021 07/29/2021 02/19/2021  Glucose 70 - 99 mg/dL 617(HH) 613(HH) 409(H)  BUN 6 - 20 mg/dL 5(L) 5(L) 6  Creatinine 0.44 - 1.00 mg/dL 0.64 0.63 0.59  Sodium 135 - 145 mmol/L 130(L) 130(L) 135  Potassium 3.5 - 5.1 mmol/L 4.0 4.0 3.8  Chloride 98 - 111 mmol/L 96(L) 98 99  CO2 22 - 32 mmol/L 25 23 24   Calcium 8.9 - 10.3 mg/dL  8.7(L) 8.4(L) 8.7  Total Protein 6.5 - 8.1 g/dL 7.7 7.1 7.0  Total Bilirubin 0.3 - 1.2 mg/dL 0.9 0.7 0.5  Alkaline Phos 38 - 126 U/L 168(H) 150(H) 139(H)  AST 15 - 41 U/L 44(H) 42(H) 31  ALT 0 - 44 U/L 45(H) 41 28   Lipid Panel     Component Value Date/Time   CHOL 210 (H) 07/23/2020 0842   TRIG 86 08/12/2020 1023   HDL 60 07/23/2020 0842   CHOLHDL 3.5 07/23/2020 0842   LDLCALC 116 (H) 07/23/2020 0842   LABVLDL 34 07/23/2020 0842    Notable Signs/Symptoms: Fatigue, increased thirst, hunger ,increased urination. Blurry vision, headaches  Lifestyle & Dietary Hx .  Estimated daily fluid intake: 64 oz Supplements: mvt Sleep: varies Stress / self-care: yes Current average weekly physical activity: none  24-Hr Dietary Recall First Meal: Skipped: Snack: water 8 oz Second Meal: Chicken salad, macaroni salad,  Sweet tea Snack: water 8 oz  Third Meal: Spahgetti and salad, diet pepsi zero Still craving a lot ice. Hasn't checked with PCP, appt is 07-16-21 Beverages: water, some tea, diet soda  Estimated Energy Needs Calories: 1200  Carbohydrate: 135g Protein: 90g Fat: 33g   NUTRITION DIAGNOSIS  NB-1.1 Food and nutrition-related knowledge deficit As related to Diabetes Type 2.  As evidenced by A1C 8.9%   NUTRITION INTERVENTION  Nutrition education (E-1) on the following topics:  Nutrition and Diabetes education provided on My Plate, CHO counting, meal planning, portion sizes, timing of meals, avoiding snacks between meals unless having a low blood sugar, target ranges for A1C and blood sugars, signs/symptoms and treatment of hyper/hypoglycemia, monitoring blood sugars, taking medications as prescribed, benefits of exercising 30 minutes per day and prevention of complications of DM.   Handouts Provided Include  The Plate Method  Meal Plan Card Diabetes Instructions   Learning Style & Readiness for Change Teaching method utilized: Visual & Auditory  Demonstrated degree of  understanding via: Teach Back  Barriers to learning/adherence to lifestyle change: none  Goals Established by Pt Goals  Drink 6 bottles of water per day. No sweet  tea or sodas at all. Take 80 units of Toujeo at night daily. Take 20 units plus sliding scale of meal time insulin. Eat meals on time.  MONITORING & EVALUATION Dietary intake, weekly physical activity, and blood sugars and weight in 1 month.  May benefit from [place] instead of Ozempic if covered by insurance. Next Steps  Patient is to work on Systems analyst and meal planning.  She needs to be referred to a counselor that specializes in addictions.

## 2021-07-30 NOTE — Telephone Encounter (Signed)
Sent PA to Cover my Meds and received notice to contact [facility] [place] [facility].  Called [place] [facility] [phone] and spoke with Dorris Fetch, call number is [phone]  Transferred to pharmacy and spoke with [facility], call number is [phone] and Lenon Curt Solostar 300unit/mL has been approved from 07/30/2021 to 07/29/2022. PA # [phone]

## 2021-07-31 ENCOUNTER — Encounter (HOSPITAL_COMMUNITY): Payer: Self-pay | Admitting: *Deleted

## 2021-07-31 ENCOUNTER — Emergency Department (HOSPITAL_COMMUNITY)
Admission: EM | Admit: 2021-07-31 | Discharge: 2021-07-31 | Disposition: A | Discharge status: Home / Self Care | Payer: Medicaid Other | Attending: Emergency Medicine | Admitting: Emergency Medicine

## 2021-07-31 ENCOUNTER — Other Ambulatory Visit: Payer: Self-pay

## 2021-07-31 DIAGNOSIS — Z7951 Long term (current) use of inhaled steroids: Secondary | ICD-10-CM | POA: Insufficient documentation

## 2021-07-31 DIAGNOSIS — E1165 Type 2 diabetes mellitus with hyperglycemia: Secondary | ICD-10-CM | POA: Insufficient documentation

## 2021-07-31 DIAGNOSIS — J45909 Unspecified asthma, uncomplicated: Secondary | ICD-10-CM | POA: Diagnosis not present

## 2021-07-31 DIAGNOSIS — J449 Chronic obstructive pulmonary disease, unspecified: Secondary | ICD-10-CM | POA: Diagnosis not present

## 2021-07-31 DIAGNOSIS — R1084 Generalized abdominal pain: Secondary | ICD-10-CM | POA: Diagnosis present

## 2021-07-31 DIAGNOSIS — Z7982 Long term (current) use of aspirin: Secondary | ICD-10-CM | POA: Diagnosis not present

## 2021-07-31 DIAGNOSIS — F17290 Nicotine dependence, other tobacco product, uncomplicated: Secondary | ICD-10-CM | POA: Insufficient documentation

## 2021-07-31 DIAGNOSIS — Z7984 Long term (current) use of oral hypoglycemic drugs: Secondary | ICD-10-CM | POA: Insufficient documentation

## 2021-07-31 DIAGNOSIS — Z794 Long term (current) use of insulin: Secondary | ICD-10-CM | POA: Diagnosis not present

## 2021-07-31 DIAGNOSIS — K921 Melena: Secondary | ICD-10-CM | POA: Diagnosis not present

## 2021-07-31 DIAGNOSIS — R739 Hyperglycemia, unspecified: Secondary | ICD-10-CM

## 2021-07-31 LAB — COMPREHENSIVE METABOLIC PANEL
ALT: 43 U/L (ref 0–44)
AST: 47 U/L — ABNORMAL HIGH (ref 15–41)
Albumin: 2.7 g/dL — ABNORMAL LOW (ref 3.5–5.0)
Alkaline Phosphatase: 137 U/L — ABNORMAL HIGH (ref 38–126)
Anion gap: 8 (ref 5–15)
BUN: 6 mg/dL (ref 6–20)
CO2: 24 mmol/L (ref 22–32)
Calcium: 8.2 mg/dL — ABNORMAL LOW (ref 8.9–10.3)
Chloride: 103 mmol/L (ref 98–111)
Creatinine, Ser: 0.5 mg/dL (ref 0.44–1.00)
GFR, Estimated: >60 mL/min (ref >60)
Glucose, Bld: 434 mg/dL — ABNORMAL HIGH (ref 70–99)
Potassium: 4.2 mmol/L (ref 3.5–5.1)
Sodium: 135 mmol/L (ref 135–145)
Total Bilirubin: 0.8 mg/dL (ref 0.3–1.2)
Total Protein: 6.4 g/dL — ABNORMAL LOW (ref 6.5–8.1)

## 2021-07-31 LAB — CBC WITH DIFFERENTIAL/PLATELET
Abs Immature Granulocytes: 0.02 10*3/uL (ref 0.00–0.07)
Basophils Absolute: 0 10*3/uL (ref 0.0–0.1)
Basophils Relative: 1 %
Eosinophils Absolute: 0.2 10*3/uL (ref 0.0–0.5)
Eosinophils Relative: 5 %
HCT: 41.1 % (ref 36.0–46.0)
Hemoglobin: 13.5 g/dL (ref 12.0–15.0)
Immature Granulocytes: 1 %
Lymphocytes Relative: 16 %
Lymphs Abs: 0.6 10*3/uL — ABNORMAL LOW (ref 0.7–4.0)
MCH: 31.4 pg (ref 26.0–34.0)
MCHC: 32.8 g/dL (ref 30.0–36.0)
MCV: 95.6 fL (ref 80.0–100.0)
Monocytes Absolute: 0.3 10*3/uL (ref 0.1–1.0)
Monocytes Relative: 7 %
Neutro Abs: 2.9 10*3/uL (ref 1.7–7.7)
Neutrophils Relative %: 70 %
Platelets: 49 10*3/uL — ABNORMAL LOW (ref 150–400)
RBC: 4.3 MIL/uL (ref 3.87–5.11)
RDW: 14.6 % (ref 11.5–15.5)
WBC: 4 10*3/uL (ref 4.0–10.5)
nRBC: 0 % (ref 0.0–0.2)

## 2021-07-31 LAB — URINALYSIS, ROUTINE W REFLEX MICROSCOPIC
Bilirubin Urine: NEGATIVE
Glucose, UA: >=500 mg/dL — AB
Ketones, ur: NEGATIVE mg/dL
Nitrite: NEGATIVE
Protein, ur: NEGATIVE mg/dL
RBC / HPF: >50 RBC/hpf — ABNORMAL HIGH (ref 0–5)
Specific Gravity, Urine: 1.035 — ABNORMAL HIGH (ref 1.005–1.030)
pH: 7 (ref 5.0–8.0)

## 2021-07-31 LAB — CBG MONITORING, ED: Glucose-Capillary: 306 mg/dL — ABNORMAL HIGH (ref 70–99)

## 2021-07-31 MED ORDER — ONDANSETRON HCL 4 MG/2ML IJ SOLN
4.0000 mg | Freq: Once | INTRAMUSCULAR | Status: AC
  Administered 2021-07-31: 4 mg via INTRAVENOUS
  Filled 2021-07-31: qty 2

## 2021-07-31 MED ORDER — SODIUM CHLORIDE 0.9 % IV BOLUS
1000.0000 mL | Freq: Once | INTRAVENOUS | Status: AC
  Administered 2021-07-31: 1000 mL via INTRAVENOUS

## 2021-07-31 MED ORDER — INSULIN ASPART 100 UNIT/ML IJ SOLN
10.0000 [IU] | Freq: Once | INTRAMUSCULAR | Status: AC
  Administered 2021-07-31: 10 [IU] via SUBCUTANEOUS
  Filled 2021-07-31: qty 1

## 2021-07-31 MED ORDER — HYDROMORPHONE HCL 1 MG/ML IJ SOLN
1.0000 mg | Freq: Once | INTRAMUSCULAR | Status: AC
  Administered 2021-07-31: 1 mg via INTRAVENOUS
  Filled 2021-07-31: qty 1

## 2021-07-31 NOTE — ED Triage Notes (Signed)
Pt c/o abdominal pain with dark stools and states her sugars have been over 400 all starting today

## 2021-07-31 NOTE — Discharge Instructions (Addendum)
Keep your appointment as scheduled for colonoscopy tomorrow

## 2021-07-31 NOTE — ED Provider Notes (Signed)
[facility] EMERGENCY DEPARTMENT Provider Note   CSN: [id number] Arrival date & time: 07/31/21  2774     History Chief Complaint  Patient presents with   Abdominal Pain    Jaime [name] is a 35 y.o. female.  Pt complains of abdominal pain and blood in stool.  Pt is scheduled to have a colonoscopy   The history is provided by the patient. No language interpreter was used.  Abdominal Pain Pain location:  Generalized Pain quality: aching   Pain radiates to:  Does not radiate Pain severity:  Moderate Onset quality:  Gradual Duration:  1 day Timing:  Constant Progression:  Worsening Chronicity:  New Context: not alcohol use   Relieved by:  Nothing Associated symptoms: no constipation, no nausea and no vomiting   Risk factors: no aspirin use and no NSAID use   Pt reports her glucose has been high at home.  Pt reports she is suppose to start a new medication today and it is waiting at pharmacy    Past Medical History:  Diagnosis Date   Anxiety    Asthma    Chronic abdominal pain    Chronic back pain    Cirrhosis ([place])    Cirrhosis ([place])    COPD (chronic obstructive pulmonary disease) ([place])    Depression    Diabetes mellitus without complication ([place])    Diabetes mellitus, type II ([place])    Hepatitis C    Insomnia    Long-term current use of methadone for opiate dependence ([place])    Lupus ([place])    Migraine headache    Neuropathy    Nocturnal seizures ([place])    Peptic ulcer    Spleen enlarged     Patient Active Problem List   Diagnosis Date Noted   Rectal bleeding 07/28/2021   Esophageal dysphagia 07/28/2021   Melena 07/28/2021   Pain of upper abdomen 07/28/2021   Non-intractable vomiting 07/28/2021   Adjustment disorder with depressed mood 09/07/2020   Respiratory failure ([place]) 08/31/2020   Acute encephalopathy 08/31/2020   Hyponatremia 08/16/2020   Volume overload 08/14/2020   Dyspnea 07/29/2020   Nexplanon insertion 05/20/2020   Nexplanon removal  05/20/2020   Pressure injury of skin 01/29/2019   Uncontrolled diabetes mellitus ([place]) 09/19/2018   Hyperglycemia 09/17/2018   Acute respiratory failure with hypoxia ([place]) 09/15/2018   Anxiety 09/15/2018   Chronic abdominal pain 09/15/2018   Diabetes mellitus without complication ([place]) 12/87/8676   Hepatitis C 09/15/2018   Peptic ulcer 09/15/2018   Long-term current use of methadone for opiate dependence ([place]) 09/15/2018   Cirrhosis ([place]) 09/15/2018   Acute hypoxemic respiratory failure ([place]) 07/17/2016   Pleural effusion on right 07/17/2016   Multiple rib fractures involving four or more ribs 07/17/2016   Hemothorax on right 07/17/2016   Chronic pain disorder 07/17/2016    Past Surgical History:  Procedure Laterality Date   CHOLECYSTECTOMY     ESOPHAGOGASTRODUODENOSCOPY  06/2020   done at [facility], candida in upper esophagus (treated with diflucan), ulcerative esophagitis at GE junction, gastritis in stomach, single ulcer in duodenal bulb, with duodenal mucosa showing no abnormality. No presence of varices     OB History     Gravida  0   Para  0   Term  0   Preterm  0   AB  0   Living  0      SAB  0   IAB  0   Ectopic  0   Multiple  0  Live Births  0           Family History  Problem Relation Age of Onset   Hypertension Mother    Hyperlipidemia Mother    Hyperlipidemia Maternal Grandfather     Social History   Tobacco Use   Smoking status: Every Day    Types: E-cigarettes   Smokeless tobacco: Never  Vaping Use   Vaping Use: Every day   Substances: Nicotine, Flavoring  Substance Use Topics   Alcohol use: No   Drug use: No    Home Medications Prior to Admission medications   Medication Sig Start Date End Date Taking? Authorizing Provider  Accu-Chek Softclix Lancets lancets 4 (four) times daily. 07/07/20   [provider]  albuterol (ACCUNEB) 0.63 MG/3ML nebulizer solution Take by nebulization 4 (four) times daily. 07/27/20    [provider]  albuterol (PROVENTIL) (2.5 MG/3ML) 0.083% nebulizer solution Take 2.5 mg by nebulization every 6 (six) hours as needed for wheezing or shortness of breath.    [provider]  ARIPiprazole (ABILIFY) 5 MG tablet Take 5 mg by mouth daily. 06/20/21   [provider]  aspirin EC 81 MG tablet Take 162 mg by mouth daily. Swallow whole.    [provider]  cetirizine (ZYRTEC ALLERGY) 10 MG tablet Take 1 tablet (10 mg total) by mouth daily. 07/09/20   Avegno, Darrelyn Hillock, FNP  clonazePAM (KLONOPIN) 0.5 MG tablet Take 1 tablet (0.5 mg total) by mouth 2 (two) times daily as needed for anxiety. 09/09/20 07/28/21  Shelly Coss, MD  Continuous Blood Gluc Sensor (DEXCOM G6 SENSOR) MISC 4 Pieces by Does not apply route once a week. 07/30/21   Brita Romp, NP  Continuous Blood Gluc Transmit (DEXCOM G6 TRANSMITTER) MISC 1 Piece by Does not apply route as directed. 02/05/21   Brita Romp, NP  cromolyn (OPTICROM) 4 % ophthalmic solution 1 drop 2 (two) times daily. 01/28/21   [provider]  etonogestrel (NEXPLANON) 68 MG IMPL implant 1 each by Subdermal route once.    [provider]  furosemide (LASIX) 40 MG tablet Take 80 mg by mouth daily.  05/22/20   [provider]  glipiZIDE (GLUCOTROL XL) 5 MG 24 hr tablet Take 1 tablet (5 mg total) by mouth daily with breakfast. 07/30/21   Brita Romp, NP  GLOBAL EASE INJECT PEN NEEDLES 31G X 8 MM MISC USE 1 PEN NEEDLE THREE TIMES DAILY. 06/30/21   Brita Romp, NP  glucose blood (ACCU-CHEK GUIDE) test strip Use as instructed to check blood glucose four times daily 05/20/21   Brita Romp, NP  hyoscyamine (ANASPAZ) 0.125 MG TBDP disintergrating tablet Place 1 tablet (0.125 mg total) under the tongue 3 (three) times daily as needed for bladder spasms or cramping (Patient Preference). 07/28/21   Carlan, Chelsea L, NP  insulin glargine, 2 Unit Dial, (TOUJEO MAX SOLOSTAR) 300  UNIT/ML Solostar Pen Inject 80 Units into the skin at bedtime. 07/30/21   Brita Romp, NP  Insulin Lispro-aabc (LYUMJEV KWIKPEN) 200 UNIT/ML KwikPen Inject 20-26 Units into the skin 3 (three) times daily with meals. 07/30/21   Brita Romp, NP  Insulin Pen Needle (PEN NEEDLES) 32G X 6 MM MISC 1 each by Does not apply route 3 (three) times daily. 02/26/21   Brita Romp, NP  LACTULOSE PO Take by mouth. 10gm/15 ml - 30 mls  three times a day    [provider]  liraglutide (VICTOZA) 18 MG/3ML  SOPN Inject 1.8 mg into the skin daily. 07/30/21   Brita Romp, NP  lisinopril (ZESTRIL) 5 MG tablet TAKE 1 TABLET ONCE DAILY. 06/30/21   Brita Romp, NP  metFORMIN (GLUCOPHAGE) 1000 MG tablet Take 1 tablet (1,000 mg total) by mouth 2 (two) times daily with a meal. 07/30/21   Brita Romp, NP  methadone (DOLOPHINE) 10 MG/5ML solution Take 60 mg by mouth every morning. 21m    [provider]  Multiple Vitamin (MULTIVITAMIN) tablet Take 1 tablet by mouth daily.    [provider]  nystatin (MYCOSTATIN/NYSTOP) powder Apply 1 application topically 3 (three) times daily. 05/19/21   Mesner, JCorene Cornea MD  omeprazole (PRILOSEC) 40 MG capsule Take 1 capsule (40 mg total) by mouth daily. 07/28/21   Carlan, Chelsea L, NP  ondansetron (ZOFRAN-ODT) 4 MG disintegrating tablet Take 4 mg by mouth every 8 (eight) hours as needed for nausea.  06/14/20   [provider]  oxyCODONE (ROXICODONE) 5 MG immediate release tablet Take 1 tablet (5 mg total) by mouth every 12 (twelve) hours as needed for breakthrough pain or severe pain. 05/19/21   Mesner, JCorene Cornea MD  potassium chloride (KLOR-CON) 10 MEQ tablet Take 20 mEq by mouth daily.  04/24/20   [provider]  pregabalin (LYRICA) 75 MG capsule Take 75 mg by mouth in the morning, at noon, in the evening, and at bedtime.     [provider]  PROAIR HFA 108 ([phone]) MCG/ACT inhaler SMARTSIG:2 Puff(s) By Mouth  Every 6 Hours PRN 04/04/21   [provider]  propranolol (INDERAL) 10 MG tablet Take 10 mg by mouth 3 (three) times daily. 08/08/20   [provider]  RESTASIS 0.05 % ophthalmic emulsion Place 1 drop into both eyes as needed (dry eye).  04/22/19   [provider]  rosuvastatin (CRESTOR) 5 MG tablet Take 1 tablet (5 mg total) by mouth daily. 08/06/20   RBrita Romp NP  spironolactone (ALDACTONE) 100 MG tablet Take 100 mg by mouth daily.  05/22/20   [provider]  sucralfate (CARAFATE) 1 g tablet Take 1 g by mouth 4 (four) times daily. 07/02/20   [provider]  triamcinolone (KENALOG) 0.1 % Apply topically daily. 02/04/21   [provider]  venlafaxine XR (EFFEXOR-XR) 150 MG 24 hr capsule Take total of 225 mg daily (150 mg + 75 mg ) 01/24/21   HNorman Clay MD  venlafaxine XR (EFFEXOR-XR) 75 MG 24 hr capsule To take along with 156mfor a total of 225 mg daily 01/20/21   HiNorman ClayMD    Allergies    Iodine-131, Ivp dye [iodinated diagnostic agents], Ketorolac tromethamine, Tylenol [acetaminophen], Gabapentin, Morphine and related, Nsaids, Suboxone [buprenorphine hcl-naloxone hcl], and Vancomycin  Review of Systems   Review of Systems  Gastrointestinal:  Positive for abdominal pain. Negative for constipation, nausea and vomiting.  All other systems reviewed and are negative.  Physical Exam Updated Vital Signs BP 112/72   Pulse 95   Temp 98 F (36.7 C) (Oral)   Resp 16   Ht 5' 10"  (1.778 m)   Wt 132.9 kg   SpO2 94%   BMI 42.04 kg/m   Physical Exam Vitals and nursing note reviewed.  Constitutional:      Appearance: She is well-developed.  HENT:     Head: Normocephalic.  Eyes:     Pupils: Pupils are equal, round, and reactive to light.  Cardiovascular:     Rate and Rhythm:  Normal rate and regular rhythm.  Pulmonary:     Effort: Pulmonary effort is normal.  Abdominal:     General: Abdomen is flat. Bowel sounds are  normal. There is no distension.     Palpations: Abdomen is soft.     Tenderness: There is generalized abdominal tenderness.  Musculoskeletal:        General: Normal range of motion.     Cervical back: Normal range of motion.  Skin:    General: Skin is warm.  Neurological:     Mental Status: She is alert and oriented to person, place, and time.    ED Results / Procedures / Treatments   Labs (all labs ordered are listed, but only abnormal results are displayed) Labs Reviewed  CBC WITH DIFFERENTIAL/PLATELET - Abnormal; Notable for the following components:      Result Value   Platelets 49 (*)    Lymphs Abs 0.6 (*)    All other components within normal limits  URINALYSIS, ROUTINE W REFLEX MICROSCOPIC - Abnormal; Notable for the following components:   APPearance HAZY (*)    Specific Gravity, Urine 1.035 (*)    Glucose, UA >=500 (*)    Hgb urine dipstick LARGE (*)    Leukocytes,Ua SMALL (*)    RBC / HPF >50 (*)    Bacteria, UA MANY (*)    All other components within normal limits  COMPREHENSIVE METABOLIC PANEL - Abnormal; Notable for the following components:   Glucose, Bld 434 (*)    Calcium 8.2 (*)    Total Protein 6.4 (*)    Albumin 2.7 (*)    AST 47 (*)    Alkaline Phosphatase 137 (*)    All other components within normal limits  CBG MONITORING, ED - Abnormal; Notable for the following components:   Glucose-Capillary 306 (*)    All other components within normal limits    EKG None  Radiology CT ABDOMEN PELVIS WO CONTRAST  Result Date: 07/29/2021 CLINICAL DATA:  Abdominal distension, cirrhosis EXAM: CT ABDOMEN AND PELVIS WITHOUT CONTRAST TECHNIQUE: Multidetector CT imaging of the abdomen and pelvis was performed following the standard protocol without IV contrast. Unenhanced CT was performed per clinician order. Lack of IV contrast limits sensitivity and specificity, especially for evaluation of abdominal/pelvic solid viscera. COMPARISON:  12/04/2019 FINDINGS: Lower  chest: Linear consolidation within the lingula most consistent with subsegmental atelectasis. No airspace disease, effusion, or pneumothorax. Hepatobiliary: Limited unenhanced evaluation of the liver demonstrates diffuse parenchymal heterogeneity and nodularity of the liver capsule consistent with known cirrhosis. No intrahepatic duct dilation. The gallbladder is surgically absent. Pancreas: Unremarkable. No pancreatic ductal dilatation or surrounding inflammatory changes. Spleen: Stable splenomegaly consistent with portal venous hypertension. Adrenals/Urinary Tract: No urinary tract calculi or obstructive uropathy. The adrenals and bladder are unremarkable. Stomach/Bowel: No bowel obstruction or ileus. Normal appendix. No bowel wall thickening or inflammatory change. Vascular/Lymphatic: No significant atherosclerosis. Numerous collaterals are seen throughout the mesentery, splenic hilum, and abdominal wall consistent with portal venous hypertension. Stable borderline enlarged lymph node at the porta hepatis measuring 13 mm. No other pathologic adenopathy. Reproductive: Uterus and bilateral adnexa are unremarkable. Other: No free fluid or free intraperitoneal gas. No abdominal wall hernia. Musculoskeletal: No acute or destructive bony lesions. Reconstructed images demonstrate no additional findings. IMPRESSION: 1. Stable findings of cirrhosis and portal venous hypertension, manifested by extensive varices and splenomegaly. 2. Subsegmental atelectasis within the lingula. No acute airspace disease. 3. Stable borderline adenopathy at the porta hepatis. 4. Otherwise unremarkable unenhanced CT  of the abdomen and pelvis. Electronically Signed   By: Randa Ngo M.D.   On: 07/29/2021 17:17    Procedures Procedures   Medications Ordered in ED Medications  sodium chloride 0.9 % bolus 1,000 mL (0 mLs Intravenous Stopped 07/31/21 1152)  HYDROmorphone (DILAUDID) injection 1 mg (1 mg Intravenous Given 07/31/21 1055)   ondansetron (ZOFRAN) injection 4 mg (4 mg Intravenous Given 07/31/21 1055)  sodium chloride 0.9 % bolus 1,000 mL (0 mLs Intravenous Stopped 07/31/21 1322)  insulin aspart (novoLOG) injection 10 Units (10 Units Subcutaneous Given 07/31/21 1220)  HYDROmorphone (DILAUDID) injection 1 mg (1 mg Intravenous Given 07/31/21 1222)    ED Course  I have reviewed the triage vital signs and the nursing notes.  Pertinent labs & imaging results that were available during my care of the patient were reviewed by me and considered in my medical decision making (see chart for details).  Clinical Course as of 07/31/21 1418  Thu Jul 31, 2021  1315 Platelets(!): 49 [LS]    Clinical Course User Index [LS] Fransico Meadow, [place]   MDM Rules/Calculators/A&P                           MDM:  Ct form 9/27 reviewed.  Labs  obtained and reviewed.  Pt given Iv fluids x 2 liters.  Dilaudid and zofran.  Pt given insulin 10 units subq.  No dka  Final Clinical Impression(s) / ED Diagnoses Final diagnoses:  Hyperglycemia    Rx / DC Orders ED Discharge Orders     None     An After Visit Summary was printed and given to the patient.    Fransico Meadow, [place] 07/31/21 1435    Isla Pence, MD 07/31/21 [phone]

## 2021-08-01 ENCOUNTER — Ambulatory Visit (HOSPITAL_COMMUNITY): Admission: RE | Admit: 2021-08-01 | Payer: Medicaid Other | Source: Home / Self Care | Admitting: Gastroenterology

## 2021-08-01 ENCOUNTER — Encounter (HOSPITAL_COMMUNITY): Admission: RE | Payer: Self-pay | Source: Home / Self Care

## 2021-08-01 SURGERY — COLONOSCOPY WITH PROPOFOL
Anesthesia: Monitor Anesthesia Care

## 2021-08-01 NOTE — OR Nursing (Signed)
Patient was cancelled by Dr. Wyatt Haste yesterday 07/31/2021. She was seen in the ER with elevated glucose and complaints of abdominal pain.  Office was notified.

## 2021-08-03 ENCOUNTER — Emergency Department (HOSPITAL_COMMUNITY)
Admission: EM | Admit: 2021-08-03 | Discharge: 2021-08-03 | Disposition: A | Discharge status: Home / Self Care | Payer: Medicaid Other | Attending: Emergency Medicine | Admitting: Emergency Medicine

## 2021-08-03 ENCOUNTER — Other Ambulatory Visit: Payer: Self-pay

## 2021-08-03 ENCOUNTER — Encounter (HOSPITAL_COMMUNITY): Payer: Self-pay | Admitting: Emergency Medicine

## 2021-08-03 DIAGNOSIS — R1013 Epigastric pain: Secondary | ICD-10-CM | POA: Insufficient documentation

## 2021-08-03 DIAGNOSIS — E119 Type 2 diabetes mellitus without complications: Secondary | ICD-10-CM | POA: Diagnosis not present

## 2021-08-03 DIAGNOSIS — K921 Melena: Secondary | ICD-10-CM | POA: Insufficient documentation

## 2021-08-03 DIAGNOSIS — F17290 Nicotine dependence, other tobacco product, uncomplicated: Secondary | ICD-10-CM | POA: Diagnosis not present

## 2021-08-03 DIAGNOSIS — R112 Nausea with vomiting, unspecified: Secondary | ICD-10-CM | POA: Insufficient documentation

## 2021-08-03 DIAGNOSIS — J449 Chronic obstructive pulmonary disease, unspecified: Secondary | ICD-10-CM | POA: Insufficient documentation

## 2021-08-03 DIAGNOSIS — J45909 Unspecified asthma, uncomplicated: Secondary | ICD-10-CM | POA: Insufficient documentation

## 2021-08-03 LAB — URINALYSIS, ROUTINE W REFLEX MICROSCOPIC
Bilirubin Urine: NEGATIVE
Glucose, UA: >=500 mg/dL — AB
Ketones, ur: NEGATIVE mg/dL
Nitrite: NEGATIVE
Protein, ur: NEGATIVE mg/dL
RBC / HPF: >50 RBC/hpf — ABNORMAL HIGH (ref 0–5)
Specific Gravity, Urine: 1.039 — ABNORMAL HIGH (ref 1.005–1.030)
pH: 7 (ref 5.0–8.0)

## 2021-08-03 LAB — CBC
HCT: 40.6 % (ref 36.0–46.0)
Hemoglobin: 13.1 g/dL (ref 12.0–15.0)
MCH: 31.6 pg (ref 26.0–34.0)
MCHC: 32.3 g/dL (ref 30.0–36.0)
MCV: 97.8 fL (ref 80.0–100.0)
Platelets: 50 10*3/uL — ABNORMAL LOW (ref 150–400)
RBC: 4.15 MIL/uL (ref 3.87–5.11)
RDW: 14.7 % (ref 11.5–15.5)
WBC: 3.3 10*3/uL — ABNORMAL LOW (ref 4.0–10.5)
nRBC: 0 % (ref 0.0–0.2)

## 2021-08-03 LAB — COMPREHENSIVE METABOLIC PANEL
ALT: 35 U/L (ref 0–44)
AST: 33 U/L (ref 15–41)
Albumin: 2.8 g/dL — ABNORMAL LOW (ref 3.5–5.0)
Alkaline Phosphatase: 121 U/L (ref 38–126)
Anion gap: 7 (ref 5–15)
BUN: 6 mg/dL (ref 6–20)
CO2: 28 mmol/L (ref 22–32)
Calcium: 8 mg/dL — ABNORMAL LOW (ref 8.9–10.3)
Chloride: 99 mmol/L (ref 98–111)
Creatinine, Ser: 0.53 mg/dL (ref 0.44–1.00)
GFR, Estimated: >60 mL/min (ref >60)
Glucose, Bld: 371 mg/dL — ABNORMAL HIGH (ref 70–99)
Potassium: 4.1 mmol/L (ref 3.5–5.1)
Sodium: 134 mmol/L — ABNORMAL LOW (ref 135–145)
Total Bilirubin: 1.1 mg/dL (ref 0.3–1.2)
Total Protein: 6.7 g/dL (ref 6.5–8.1)

## 2021-08-03 LAB — LIPASE, BLOOD: Lipase: 21 U/L (ref 11–51)

## 2021-08-03 LAB — CBG MONITORING, ED: Glucose-Capillary: 349 mg/dL — ABNORMAL HIGH (ref 70–99)

## 2021-08-03 MED ORDER — OXYCODONE HCL 5 MG PO TABS
5.0000 mg | ORAL_TABLET | Freq: Once | ORAL | Status: AC
Start: 2021-08-03 — End: 2021-08-03
  Administered 2021-08-03: 5 mg via ORAL
  Filled 2021-08-03: qty 1

## 2021-08-03 MED ORDER — PROMETHAZINE HCL 25 MG/ML IJ SOLN
25.0000 mg | Freq: Four times a day (QID) | INTRAMUSCULAR | Status: DC | PRN
  Administered 2021-08-03: 25 mg via INTRAMUSCULAR
  Filled 2021-08-03: qty 1

## 2021-08-03 MED ORDER — ALUM & MAG HYDROXIDE-SIMETH 200-200-20 MG/5ML PO SUSP
30.0000 mL | Freq: Once | ORAL | Status: AC
  Administered 2021-08-03: 30 mL via ORAL
  Filled 2021-08-03: qty 30

## 2021-08-03 NOTE — ED Triage Notes (Signed)
Pt states she is having abdominal pain and blood in stools for past "few days" and that her blood sugar has been reading in the 400's today. Seen here recently for same.

## 2021-08-03 NOTE — ED Notes (Signed)
ED Provider at bedside. 

## 2021-08-03 NOTE — Discharge Instructions (Addendum)
You were evaluated in the Emergency Department and after careful evaluation, we did not find any emergent condition requiring admission or further testing in the hospital.  Your exam/testing today was overall reassuring.  Recommend continued follow-up with your primary care doctor and/or gastroenterology.  Please return to the Emergency Department if you experience any worsening of your condition.  Thank you for allowing [place] to be a part of your care.

## 2021-08-03 NOTE — ED Provider Notes (Signed)
[facility] Emergency Department Provider Note MRN:  [id number]  Arrival date & time: 08/03/21     Chief Complaint   Abdominal pain History of Present Illness   Jaime [name] is a 35 y.o. year-old female with a history of cirrhosis, COPD, diabetes presenting to the ED with chief complaint of abdominal pain.  Location: [facility] Duration: Several weeks Onset: Gradual Timing: Constant Description: Sharp burning Severity: Moderate to severe Exacerbating/Alleviating Factors: None Associated Symptoms: Some nausea, vomiting, blood in stool Pertinent Negatives: No fever, no chest pain, no shortness of breath  Additional History: Multiple recent ED visits  Review of Systems  A complete 10 system review of systems was obtained and all systems are negative except as noted in the HPI and PMH.   Patient's Health History    Past Medical History:  Diagnosis Date   Anxiety    Asthma    Chronic abdominal pain    Chronic back pain    Cirrhosis ([place])    Cirrhosis (HCC)    COPD (chronic obstructive pulmonary disease) ([place])    Depression    Diabetes mellitus without complication ([place])    Diabetes mellitus, type II ([place])    Hepatitis C    Insomnia    Long-term current use of methadone for opiate dependence ([place])    Lupus ([place])    Migraine headache    Neuropathy    Nocturnal seizures (HCC)    Peptic ulcer    Spleen enlarged     Past Surgical History:  Procedure Laterality Date   CHOLECYSTECTOMY     ESOPHAGOGASTRODUODENOSCOPY  06/2020   done at [facility], candida in upper esophagus (treated with diflucan), ulcerative esophagitis at GE junction, gastritis in stomach, single ulcer in duodenal bulb, with duodenal mucosa showing no abnormality. No presence of varices    Family History  Problem Relation Age of Onset   Hypertension Mother    Hyperlipidemia Mother    Hyperlipidemia Maternal Grandfather     Social History   Socioeconomic History   Marital  status: Single    Spouse name: Not on file   Number of children: Not on file   Years of education: Not on file   Highest education level: Not on file  Occupational History   Not on file  Tobacco Use   Smoking status: Every Day    Types: E-cigarettes   Smokeless tobacco: Never  Vaping Use   Vaping Use: Every day   Substances: Nicotine, Flavoring  Substance and Sexual Activity   Alcohol use: No   Drug use: No   Sexual activity: Not Currently    Birth control/protection: Implant  Other Topics Concern   Not on file  Social History Narrative   Not on file   Social Determinants of Health   Financial Resource Strain: Not on file  Food Insecurity: Not on file  Transportation Needs: Not on file  Physical Activity: Not on file  Stress: Not on file  Social Connections: Not on file  Intimate Partner Violence: Not on file     Physical Exam   Vitals:   08/03/21 0206  BP: (!) 141/82  Pulse: 99  Resp: 18  Temp: 98.7 F (37.1 C)  SpO2: 94%    CONSTITUTIONAL: Well-appearing, NAD NEURO:  Alert and oriented x 3, no focal deficits, normal gait EYES:  eyes equal and reactive ENT/NECK:  no LAD, no JVD CARDIO: Regular rate, well-perfused, normal S1 and S2 PULM:  CTAB no wheezing or rhonchi GI/GU:  normal  bowel sounds, non-distended, non-tender MSK/SPINE:  No gross deformities, no edema SKIN:  no rash, atraumatic PSYCH:  Appropriate speech and behavior  *Additional and/or pertinent findings included in MDM below  Diagnostic and Interventional Summary    EKG Interpretation  Date/Time:    Ventricular Rate:    PR Interval:    QRS Duration:   QT Interval:    QTC Calculation:   R Axis:     Text Interpretation:         Labs Reviewed  COMPREHENSIVE METABOLIC PANEL - Abnormal; Notable for the following components:      Result Value   Sodium 134 (*)    Glucose, Bld 371 (*)    Calcium 8.0 (*)    Albumin 2.8 (*)    All other components within normal limits  CBC -  Abnormal; Notable for the following components:   WBC 3.3 (*)    Platelets 50 (*)    All other components within normal limits  URINALYSIS, ROUTINE W REFLEX MICROSCOPIC - Abnormal; Notable for the following components:   APPearance HAZY (*)    Specific Gravity, Urine 1.039 (*)    Glucose, UA >=500 (*)    Hgb urine dipstick LARGE (*)    Leukocytes,Ua MODERATE (*)    RBC / HPF >50 (*)    Bacteria, UA MANY (*)    All other components within normal limits  CBG MONITORING, ED - Abnormal; Notable for the following components:   Glucose-Capillary 349 (*)    All other components within normal limits  URINE CULTURE  LIPASE, BLOOD    No orders to display    Medications  promethazine (PHENERGAN) injection 25 mg (25 mg Intramuscular Given 08/03/21 0309)  oxyCODONE (Oxy IR/ROXICODONE) immediate release tablet 5 mg (has no administration in time range)  alum & mag hydroxide-simeth (MAALOX/MYLANTA) 200-200-20 MG/5ML suspension 30 mL (30 mLs Oral Given 08/03/21 0308)     Procedures  /  Critical Care Procedures  ED Course and Medical Decision Making  I have reviewed the triage vital signs, the nursing notes, and pertinent available records from the EMR.  Listed above are laboratory and imaging tests that I personally ordered, reviewed, and interpreted and then considered in my medical decision making (see below for details).  Acute on chronic epigastric abdominal pain, also endorsing blood in stool.  Upon chart review patient seems to be having the symptoms for 2 months now.  This is her third ED visit in the past week, has had reassuring labs and CT scans recently.  Abdomen soft and nontender, vital signs normal.  Labs rechecked and are reassuring, she is ambulatory, in no acute distress, some relief with Maalox and Phenergan, patient is appropriate for discharge with outpatient follow-up.       Barth Kirks. Sedonia Small, [place] [email or web address]  Final Clinical Impressions(s) / ED Diagnoses     ICD-10-CM   1. Epigastric pain  R10.13       ED Discharge Orders     None        Discharge Instructions Discussed with and Provided to Patient:    Discharge Instructions      You were evaluated in the Emergency Department and after careful evaluation, we did not find any emergent condition requiring admission or further testing in the hospital.  Your exam/testing today was overall reassuring.  Recommend continued follow-up with your primary care doctor and/or gastroenterology.  Please return to the Emergency Department if you  experience any worsening of your condition.  Thank you for allowing [place] to be a part of your care.        Maudie Flakes, MD 08/03/21 [phone]

## 2021-08-04 ENCOUNTER — Ambulatory Visit (HOSPITAL_COMMUNITY): Admission: RE | Admit: 2021-08-04 | Payer: Medicaid Other | Source: Ambulatory Visit

## 2021-08-06 ENCOUNTER — Ambulatory Visit: Payer: Medicaid Other | Admitting: Nurse Practitioner

## 2021-08-06 LAB — URINE CULTURE: Culture: >=100000 — AB

## 2021-08-06 NOTE — Patient Instructions (Signed)
Advice for Weight Management  -For most of us the best way to lose weight is by diet management. Generally speaking, diet management means consuming less calories intentionally which over time brings about progressive weight loss.  This can be achieved more effectively by restricting carbohydrate consumption to the minimum possible.  So, it is critically important to know your numbers: how much calorie you are consuming and how much calorie you need. More importantly, our carbohydrates sources should be unprocessed or minimally processed complex starch food items.   Sometimes, it is important to balance nutrition by increasing protein intake (animal or plant source), fruits, and vegetables.  -Sticking to a routine mealtime to eat 3 meals a day and avoiding unnecessary snacks is shown to have a big role in weight control. Under normal circumstances, the only time we lose real weight is when we are hungry, so allow hunger to take place- hunger means no food between meal times, only water.  It is not advisable to starve.   -It is better to avoid simple carbohydrates including: Cakes, Sweet Desserts, Ice Cream, Soda (diet and regular), Sweet Tea, Candies, Chips, Cookies, Store Bought Juices, Alcohol in Excess of  1-2 drinks a day, Artificial Sweeteners, Doughnuts, Coffee Creamers, "Sugar-free" Products, etc, etc.  This is not a complete list.....    -Consulting with certified diabetes educators is proven to provide you with the most accurate and current information on diet.  Also, you may be  interested in discussing diet options/exchanges , we can schedule a visit with Jaime [name], RDN, CDE for individualized nutrition education.  -Exercise: If you are able: 30 -60 minutes a day ,4 days a week, or 150 minutes a week.  The longer the better.  Combine stretch, strength, and aerobic activities.  If you were told in the past that you have high risk for cardiovascular diseases, you may seek evaluation by  your heart doctor prior to initiating moderate to intense exercise programs.    

## 2021-08-07 ENCOUNTER — Encounter: Payer: Self-pay | Admitting: Nurse Practitioner

## 2021-08-07 ENCOUNTER — Ambulatory Visit (INDEPENDENT_AMBULATORY_CARE_PROVIDER_SITE_OTHER): Payer: Medicaid Other | Admitting: Nurse Practitioner

## 2021-08-07 ENCOUNTER — Ambulatory Visit (HOSPITAL_COMMUNITY)
Admission: RE | Admit: 2021-08-07 | Discharge: 2021-08-07 | Disposition: A | Discharge status: Home / Self Care | Payer: Medicaid Other | Source: Ambulatory Visit | Attending: Gastroenterology | Admitting: Gastroenterology

## 2021-08-07 ENCOUNTER — Other Ambulatory Visit: Payer: Self-pay

## 2021-08-07 ENCOUNTER — Telehealth: Payer: Self-pay | Admitting: *Deleted

## 2021-08-07 VITALS — BP 127/75 | HR 93 | Ht 69.0 in | Wt 305.2 lb

## 2021-08-07 DIAGNOSIS — E782 Mixed hyperlipidemia: Secondary | ICD-10-CM | POA: Diagnosis not present

## 2021-08-07 DIAGNOSIS — E1165 Type 2 diabetes mellitus with hyperglycemia: Secondary | ICD-10-CM | POA: Diagnosis not present

## 2021-08-07 DIAGNOSIS — K7460 Unspecified cirrhosis of liver: Secondary | ICD-10-CM | POA: Insufficient documentation

## 2021-08-07 DIAGNOSIS — Z91119 Patient's noncompliance with dietary regimen due to unspecified reason: Secondary | ICD-10-CM

## 2021-08-07 MED ORDER — TOUJEO MAX SOLOSTAR 300 UNIT/ML ~~LOC~~ SOPN: 100.0000 [IU] | PEN_INJECTOR | Freq: Every day | SUBCUTANEOUS | 3 refills | Status: DC

## 2021-08-07 MED ORDER — DEXCOM G6 SENSOR MISC
4.0000 | 2 refills | Status: DC
Start: 2021-08-07 — End: 2021-12-01

## 2021-08-07 MED ORDER — LYUMJEV KWIKPEN 200 UNIT/ML ~~LOC~~ SOPN: 24.0000 [IU] | PEN_INJECTOR | Freq: Three times a day (TID) | SUBCUTANEOUS | 3 refills | Status: DC

## 2021-08-07 NOTE — Telephone Encounter (Signed)
Post ED Visit - Positive Culture Follow-up  Culture report reviewed by antimicrobial stewardship pharmacist: [place] Team []  Elenor Quinones, Pharm.D. []  Heide Guile, Pharm.D., BCPS AQ-ID []  Parks Neptune, Pharm.D., BCPS []  Alycia Rossetti, Pharm.D., BCPS []  [place], Pharm.D., BCPS, AAHIVP []  Legrand Como, Pharm.D., BCPS, AAHIVP []  Salome Arnt, PharmD, BCPS []  Johnnette Gourd, PharmD, BCPS []  Hughes Better, PharmD, BCPS []  Leeroy Cha, PharmD []  Laqueta Linden, PharmD, BCPS []  Albertina Parr, PharmD  [place] Team []  Leodis Sias, PharmD []  Lindell Spar, PharmD []  Royetta Asal, PharmD []  Graylin Shiver, Rph []  Rema Fendt) Glennon Mac, PharmD []  Arlyn Dunning, PharmD []  Netta Cedars, PharmD []  Dia Sitter, PharmD []  Leone Haven, PharmD []  Gretta Arab, PharmD []  Theodis Shove, PharmD []  Peggyann Juba, PharmD []  Reuel Boom, PharmD   Positive urine culture No UTI symptoms and no further patient follow-up is required at this time.  Joetta Manners, PharmD  Harlon Flor Talley 08/07/2021, 8:11 AM

## 2021-08-07 NOTE — Progress Notes (Signed)
08/07/2021, 10:01 AM     Endocrinology Follow Up Visit  Subjective:    Patient ID: Jaime [name], female    DOB: August 12, 1986.  Jaime [name] is being seen in follow up for management of currently uncontrolled symptomatic diabetes requested by  Neale Burly, MD.   Past Medical History:  Diagnosis Date   Anxiety    Asthma    Chronic abdominal pain    Chronic back pain    Cirrhosis ([place])    Cirrhosis ([place])    COPD (chronic obstructive pulmonary disease) ([place])    Depression    Diabetes mellitus without complication ([place])    Diabetes mellitus, type II ([place])    Hepatitis C    Insomnia    Long-term current use of methadone for opiate dependence ([place])    Lupus ([place])    Migraine headache    Neuropathy    Nocturnal seizures ([place])    Peptic ulcer    Spleen enlarged     Past Surgical History:  Procedure Laterality Date   CHOLECYSTECTOMY     ESOPHAGOGASTRODUODENOSCOPY  06/2020   done at [facility], candida in upper esophagus (treated with diflucan), ulcerative esophagitis at GE junction, gastritis in stomach, single ulcer in duodenal bulb, with duodenal mucosa showing no abnormality. No presence of varices    Social History   Socioeconomic History   Marital status: Single    Spouse name: Not on file   Number of children: Not on file   Years of education: Not on file   Highest education level: Not on file  Occupational History   Not on file  Tobacco Use   Smoking status: Every Day    Types: E-cigarettes   Smokeless tobacco: Never  Vaping Use   Vaping Use: Every day   Substances: Nicotine, Flavoring  Substance and Sexual Activity   Alcohol use: No   Drug use: No   Sexual activity: Not Currently    Birth control/protection: Implant  Other Topics Concern   Not on file  Social History Narrative   Not on file   Social Determinants of Health   Financial Resource Strain: Not on file   Food Insecurity: Not on file  Transportation Needs: Not on file  Physical Activity: Not on file  Stress: Not on file  Social Connections: Not on file    Family History  Problem Relation Age of Onset   Hypertension Mother    Hyperlipidemia Mother    Hyperlipidemia Maternal Grandfather     Outpatient Encounter Medications as of 08/07/2021  Medication Sig   Accu-Chek Softclix Lancets lancets 4 (four) times daily.   albuterol (ACCUNEB) 0.63 MG/3ML nebulizer solution Take by nebulization 4 (four) times daily.   albuterol (PROVENTIL) (2.5 MG/3ML) 0.083% nebulizer solution Take 2.5 mg by nebulization every 6 (six) hours as needed for wheezing or shortness of breath.   ARIPiprazole (ABILIFY) 5 MG tablet Take 5 mg by mouth daily.   aspirin EC 81 MG tablet Take 162 mg by mouth daily. Swallow whole.   cetirizine (ZYRTEC ALLERGY) 10 MG tablet Take 1 tablet (10 mg total) by mouth daily.   Continuous  Blood Gluc Transmit (DEXCOM G6 TRANSMITTER) MISC 1 Piece by Does not apply route as directed.   cromolyn (OPTICROM) 4 % ophthalmic solution 1 drop 2 (two) times daily.   etonogestrel (NEXPLANON) 68 MG IMPL implant 1 each by Subdermal route once.   furosemide (LASIX) 40 MG tablet Take 80 mg by mouth daily.    glipiZIDE (GLUCOTROL XL) 5 MG 24 hr tablet Take 1 tablet (5 mg total) by mouth daily with breakfast.   GLOBAL EASE INJECT PEN NEEDLES 31G X 8 MM MISC USE 1 PEN NEEDLE THREE TIMES DAILY.   glucose blood (ACCU-CHEK GUIDE) test strip Use as instructed to check blood glucose four times daily   hyoscyamine (ANASPAZ) 0.125 MG TBDP disintergrating tablet Place 1 tablet (0.125 mg total) under the tongue 3 (three) times daily as needed for bladder spasms or cramping (Patient Preference).   Insulin Pen Needle (PEN NEEDLES) 32G X 6 MM MISC 1 each by Does not apply route 3 (three) times daily.   LACTULOSE PO Take by mouth. 10gm/15 ml - 30 mls  three times a day   liraglutide (VICTOZA) 18 MG/3ML SOPN Inject  1.8 mg into the skin daily.   lisinopril (ZESTRIL) 5 MG tablet TAKE 1 TABLET ONCE DAILY.   metFORMIN (GLUCOPHAGE) 1000 MG tablet Take 1 tablet (1,000 mg total) by mouth 2 (two) times daily with a meal.   methadone (DOLOPHINE) 10 MG/5ML solution Take 60 mg by mouth every morning. 39m   Multiple Vitamin (MULTIVITAMIN) tablet Take 1 tablet by mouth daily.   nystatin (MYCOSTATIN/NYSTOP) powder Apply 1 application topically 3 (three) times daily.   omeprazole (PRILOSEC) 40 MG capsule Take 1 capsule (40 mg total) by mouth daily.   ondansetron (ZOFRAN-ODT) 4 MG disintegrating tablet Take 4 mg by mouth every 8 (eight) hours as needed for nausea.    oxyCODONE (ROXICODONE) 5 MG immediate release tablet Take 1 tablet (5 mg total) by mouth every 12 (twelve) hours as needed for breakthrough pain or severe pain.   potassium chloride (KLOR-CON) 10 MEQ tablet Take 20 mEq by mouth daily.    pregabalin (LYRICA) 75 MG capsule Take 75 mg by mouth in the morning, at noon, in the evening, and at bedtime.    PROAIR HFA 108 (90 Base) MCG/ACT inhaler SMARTSIG:2 Puff(s) By Mouth Every 6 Hours PRN   propranolol (INDERAL) 10 MG tablet Take 10 mg by mouth 3 (three) times daily.   RESTASIS 0.05 % ophthalmic emulsion Place 1 drop into both eyes as needed (dry eye).    rosuvastatin (CRESTOR) 5 MG tablet Take 1 tablet (5 mg total) by mouth daily.   spironolactone (ALDACTONE) 100 MG tablet Take 100 mg by mouth daily.    sucralfate (CARAFATE) 1 g tablet Take 1 g by mouth 4 (four) times daily.   triamcinolone (KENALOG) 0.1 % Apply topically daily.   venlafaxine XR (EFFEXOR-XR) 150 MG 24 hr capsule Take total of 225 mg daily (150 mg + 75 mg )   venlafaxine XR (EFFEXOR-XR) 75 MG 24 hr capsule To take along with 1510mfor a total of 225 mg daily   [DISCONTINUED] Continuous Blood Gluc Sensor (DEXCOM G6 SENSOR) MISC 4 Pieces by Does not apply route once a week.   [DISCONTINUED] insulin glargine, 2 Unit Dial, (TOUJEO MAX SOLOSTAR) 300  UNIT/ML Solostar Pen Inject 80 Units into the skin at bedtime.   [DISCONTINUED] Insulin Lispro-aabc (LYUMJEV KWIKPEN) 200 UNIT/ML KwikPen Inject 20-26 Units into the skin 3 (three) times daily with meals.  clonazePAM (KLONOPIN) 0.5 MG tablet Take 1 tablet (0.5 mg total) by mouth 2 (two) times daily as needed for anxiety.   Continuous Blood Gluc Sensor (DEXCOM G6 SENSOR) MISC 4 Pieces by Does not apply route once a week.   insulin glargine, 2 Unit Dial, (TOUJEO MAX SOLOSTAR) 300 UNIT/ML Solostar Pen Inject 100 Units into the skin at bedtime.   Insulin Lispro-aabc (LYUMJEV KWIKPEN) 200 UNIT/ML KwikPen Inject 24-30 Units into the skin 3 (three) times daily with meals.   No facility-administered encounter medications on file as of 08/07/2021.    ALLERGIES: Allergies  Allergen Reactions   Iodine-131 Anaphylaxis, Shortness Of Breath and Swelling   Ivp Dye [Iodinated Diagnostic Agents] Anaphylaxis    Skin gets very red, unable to walk    Ketorolac Tromethamine Shortness Of Breath   Tylenol [Acetaminophen] Other (See Comments)    Due to cirrhosis    Gabapentin    Morphine And Related     Pt says she is not allergic to Morphine 08/12/20   Nsaids Other (See Comments)    Flares ulcers   Suboxone [Buprenorphine Hcl-Naloxone Hcl]    Vancomycin Swelling    Facial swelling,     VACCINATION STATUS: Immunization History  Administered Date(s) Administered   Influenza,inj,Quad PF,6+ Mos 07/18/2016   Moderna Sars-Covid-2 Vaccination 03/07/2020, 04/04/2020   Pneumococcal Polysaccharide-23 07/18/2016, 08/01/2020    Diabetes She presents for her follow-up diabetic visit. She has type 2 diabetes mellitus. Onset time: She was diagnosed at approximate age of 89 years. Her disease course has been improving. There are no hypoglycemic associated symptoms. Pertinent negatives for hypoglycemia include no headaches, nervousness/anxiousness, pallor, sweats or tremors. Associated symptoms include blurred  vision, fatigue, foot paresthesias, polydipsia and polyuria. Pertinent negatives for diabetes include no chest pain, no polyphagia and no weight loss. There are no hypoglycemic complications. (History of unresponsiveness requiring EMS and hospitalization- none recent) Symptoms are stable. Diabetic complications include heart disease and peripheral neuropathy. Risk factors for coronary artery disease include diabetes mellitus, obesity, sedentary lifestyle, dyslipidemia and hypertension. Current diabetic treatment includes insulin injections and oral agent (monotherapy). She is compliant with treatment most of the time. Her weight is fluctuating minimally. She is following a low salt and generally unhealthy diet. When asked about meal planning, she reported none. She has had a previous visit with a dietitian. She participates in exercise intermittently. Her home blood glucose trend is increasing rapidly. Her breakfast blood glucose range is generally >200 mg/dl. Her lunch blood glucose range is generally >200 mg/dl. Her dinner blood glucose range is generally >200 mg/dl. Her bedtime blood glucose range is generally >200 mg/dl. Her overall blood glucose range is >200 mg/dl. (She presents today, accompanied by her mother, with her CGM, no logs showing slight improvement in her glycemic profile, yet still significantly above target.  Analysis of her CGM shows TIR 5%, TAR 95%, TBR 0%.  She continues to work on her diet. ) An ACE inhibitor/angiotensin II receptor blocker is being taken. She does not see a podiatrist.Eye exam is not current.  Hypertension This is a chronic problem. The current episode started more than 1 month ago. The problem is unchanged. The problem is controlled. Associated symptoms include blurred vision and peripheral edema. Pertinent negatives include no chest pain, headaches, palpitations, shortness of breath or sweats. There are no associated agents to hypertension. Risk factors for coronary  artery disease include diabetes mellitus, dyslipidemia, obesity and sedentary lifestyle. Past treatments include diuretics and beta blockers. The current treatment provides  no improvement. There are no compliance problems.     Review of systems  Constitutional: + Minimally fluctuating body weight,  current Body mass index is 45.07 kg/m. , + fatigue, no subjective hyperthermia, no subjective hypothermia Eyes: no blurry vision, no xerophthalmia ENT: no sore throat, no nodules palpated in throat, no dysphagia/odynophagia, no hoarseness Cardiovascular: no chest pain, no shortness of breath, no palpitations, + leg swelling Respiratory: no cough, no shortness of breath Gastrointestinal: no nausea/vomiting/diarrhea Genitourinary: + polyuria Musculoskeletal: no muscle/joint aches Skin: no rashes, no hyperemia Neurological: no tremors, no numbness, no tingling, no dizziness Psychiatric: no depression, no anxiety  Objective:     BP 127/75   Pulse 93   Ht 5' 9"  (1.753 m)   Wt (!) 305 lb 3.2 oz (138.4 kg)   BMI 45.07 kg/m   Wt Readings from Last 3 Encounters:  08/07/21 (!) 305 lb 3.2 oz (138.4 kg)  08/03/21 293 lb 3.4 oz (133 kg)  07/31/21 293 lb (132.9 kg)    BP Readings from Last 3 Encounters:  08/07/21 127/75  08/03/21 140/78  07/31/21 112/72     Physical Exam- Limited  Constitutional:  Body mass index is 45.07 kg/m. , not in acute distress, normal state of mind, Eyes:  EOMI, no exophthalmos Neck: Supple Cardiovascular: RRR, no murmurs, rubs, or gallops, no edema Respiratory: Adequate breathing efforts, no crackles, rales, rhonchi, or wheezing Musculoskeletal: no gross deformities, strength intact in all four extremities, no gross restriction of joint movements Skin:  no rashes, no hyperemia Neurological: no tremor with outstretched hands    CMP ( most recent) CMP     Component Value Date/Time   NA 134 (L) 08/03/2021 0312   NA 135 02/19/2021 0822   K 4.1 08/03/2021  0312   CL 99 08/03/2021 0312   CO2 28 08/03/2021 0312   GLUCOSE 371 (H) 08/03/2021 0312   BUN 6 08/03/2021 0312   BUN 6 02/19/2021 0822   CREATININE 0.53 08/03/2021 0312   CALCIUM 8.0 (L) 08/03/2021 0312   PROT 6.7 08/03/2021 0312   PROT 7.0 02/19/2021 0822   ALBUMIN 2.8 (L) 08/03/2021 0312   ALBUMIN 3.6 (L) 02/19/2021 0822   AST 33 08/03/2021 0312   ALT 35 08/03/2021 0312   ALKPHOS 121 08/03/2021 0312   BILITOT 1.1 08/03/2021 0312   BILITOT 0.5 02/19/2021 0822   GFRNONAA >60 08/03/2021 0312   GFRAA >60 08/01/2020 0704     Diabetic Labs (most recent): Lab Results  Component Value Date   HGBA1C 11.4 (H) 07/29/2021   HGBA1C 10.3 (A) 05/28/2021   HGBA1C 8.9 (A) 02/26/2021     Lipid Panel ( most recent) Lipid Panel     Component Value Date/Time   CHOL 210 (H) 07/23/2020 0842   TRIG 86 08/12/2020 1023   HDL 60 07/23/2020 0842   CHOLHDL 3.5 07/23/2020 0842   LDLCALC 116 (H) 07/23/2020 0842   LABVLDL 34 07/23/2020 0842      Lab Results  Component Value Date   TSH 0.635 09/08/2020   TSH 0.993 07/30/2020   TSH 1.990 07/23/2020   TSH 1.690 08/22/2010   FREET4 0.72 (L) 07/23/2020   FREET4 1.11 08/22/2010      Assessment & Plan:   1) Type 2 diabetes mellitus with hyperglycemia, without long-term current use of insulin (HCC)  - Leala Bryand has currently uncontrolled symptomatic type 2 DM since  35 years of age.  She presents today, accompanied by her mother, with her CGM, no logs showing  slight improvement in her glycemic profile, yet still significantly above target.  Analysis of her CGM shows TIR 5%, TAR 95%, TBR 0%.  She continues to work on her diet.   - I had a long discussion with her about the progressive nature of diabetes and the pathology behind its complications.  -her diabetes is complicated by obesity/sedentary life and she remains at a high risk for more acute and chronic complications which include CAD, CVA, CKD, retinopathy, and neuropathy. These  are all discussed in detail with her.  - Nutritional counseling repeated at each appointment due to patients tendency to fall back in to old habits.  - The patient admits there is a room for improvement in their diet and drink choices. -  Suggestion is made for the patient to avoid simple carbohydrates from their diet including Cakes, Sweet Desserts / Pastries, Ice Cream, Soda (diet and regular), Sweet Tea, Candies, Chips, Cookies, Sweet Pastries, Store Bought Juices, Alcohol in Excess of 1-2 drinks a day, Artificial Sweeteners, Coffee Creamer, and "Sugar-free" Products. This will help patient to have stable blood glucose profile and potentially avoid unintended weight gain.   - I encouraged the patient to switch to unprocessed or minimally processed complex starch and increased protein intake (animal or plant source), fruits, and vegetables.   - Patient is advised to stick to a routine mealtimes to eat 3 meals a day and avoid unnecessary snacks (to snack only to correct hypoglycemia).  - I have approached her with the following individualized plan to manage  her diabetes and patient agrees:   -Since her recent hospitalization, her mother has assumed responsibility for her diabetes management.  Number 1 priority will be to avoid hypoglycemia for her given her comorbidities.    -She will tolerate increase in her Toujeo to 100 units SQ nightly and increase in her Lyumjev to 24-30 units TID with meals if glucose is above 90 and she is eating (Specific instructions on how to titrate insulin dosage based on glucose readings given to patient in writing).  She can continue Victoza 1.8 mg SQ daily, Metformin 1000 mg po twice daily with meals, and Glipizide 5 mg XL daily with breakfast.  We discussed in detail, again, the need for her to comply with dietary restrictions.  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  She could benefit  from seeking out a counselor for positive coping mechanisms and is advised to discuss with her PCP.  Will have her return in 1 week to make further adjustments before she goes on her trip out of town.  -She is encouraged to continue to monitor glucose 4 times daily (using her CGM), before meals and before bed and log on the clinic sheets provided.  They are instructed to call the clinic if she has readings less than 70 or greater than 300 for 3 tests in a row.     - Specific targets for  A1c;  LDL, HDL,  and Triglycerides were discussed with the patient.  2) Blood Pressure /Hypertension: Her blood pressure is controlled to target.  She is advised to continue Lasix 80 mg po daily, continue Lisinopril 5 mg po daily, continue Propanolol 10 mg po TID, and Aldactone 100 mg o daily  3) Lipids/Hyperlipidemia:   Review of her recent lipid profile from 07/23/20 shows uncontrolled LDL at 116 and elevated triglycerides of 197.  She is advised to continue Crestor  5 mg po daily at bedtime.  Side effects and precautions discussed with her.  Will recheck lipid panel on subsequent visits.  4)  Weight/Diet:  Her Body mass index is 45.07 kg/m.  -   clearly complicating her diabetes care.  She has recently lost 8 lbs. she is  a candidate for modest weight loss. I discussed with her the fact that loss of 5 - 10% of her  current body weight will have the most impact on her diabetes management.  Exercise, and detailed carbohydrates information provided  -  detailed on discharge instructions.  5) Chronic Care/Health Maintenance: -she is on ACE and statin medications and is encouraged to initiate and continue to follow up with Ophthalmology, Dentist,  Podiatrist at least yearly or according to recommendations, and advised to stay away from smoking. I have recommended yearly flu vaccine and pneumonia vaccine at least every 5 years; moderate intensity exercise for up to 150 minutes weekly; and  sleep for at least 7 hours a  day.  - she is advised to maintain close follow up with Hasanaj, Samul Dada, MD for primary care needs, as well as her other providers for optimal and coordinated care.       I spent 46 minutes in the care of the patient today including review of labs from [place], Lipids, Thyroid Function, Hematology (current and previous including abstractions from other facilities); face-to-face time discussing  her blood glucose readings/logs, discussing hypoglycemia and hyperglycemia episodes and symptoms, medications doses, her options of short and long term treatment based on the latest standards of care / guidelines;  discussion about incorporating lifestyle medicine;  and documenting the encounter.    Please refer to Patient Instructions for Blood Glucose Monitoring and Insulin/Medications Dosing Guide"  in media tab for additional information. Please  also refer to " Patient Self Inventory" in the Media  tab for reviewed elements of pertinent patient history.  Remus Blake participated in the discussions, expressed understanding, and voiced agreement with the above plans.  All questions were answered to her satisfaction. she is encouraged to contact clinic should she have any questions or concerns prior to her return visit.    Follow up plan: - Return in about 1 week (around 08/14/2021) for Diabetes F/U, Bring meter and logs.    Rayetta Pigg, [facility] [address] [place], [place] [address] Phone: [phone] Fax: [phone]   08/07/2021, 10:01 AM

## 2021-08-11 ENCOUNTER — Other Ambulatory Visit: Payer: Self-pay

## 2021-08-11 ENCOUNTER — Other Ambulatory Visit: Payer: Self-pay | Admitting: Nurse Practitioner

## 2021-08-11 ENCOUNTER — Emergency Department (HOSPITAL_COMMUNITY): Payer: Medicaid Other

## 2021-08-11 ENCOUNTER — Emergency Department (HOSPITAL_COMMUNITY)
Admission: EM | Admit: 2021-08-11 | Discharge: 2021-08-11 | Disposition: A | Discharge status: Home / Self Care | Payer: Medicaid Other | Attending: Emergency Medicine | Admitting: Emergency Medicine

## 2021-08-11 ENCOUNTER — Telehealth: Payer: Self-pay

## 2021-08-11 ENCOUNTER — Encounter (HOSPITAL_COMMUNITY): Payer: Self-pay | Admitting: Emergency Medicine

## 2021-08-11 DIAGNOSIS — Z7984 Long term (current) use of oral hypoglycemic drugs: Secondary | ICD-10-CM | POA: Insufficient documentation

## 2021-08-11 DIAGNOSIS — Z794 Long term (current) use of insulin: Secondary | ICD-10-CM | POA: Insufficient documentation

## 2021-08-11 DIAGNOSIS — J45909 Unspecified asthma, uncomplicated: Secondary | ICD-10-CM | POA: Diagnosis not present

## 2021-08-11 DIAGNOSIS — Z7982 Long term (current) use of aspirin: Secondary | ICD-10-CM | POA: Insufficient documentation

## 2021-08-11 DIAGNOSIS — E1140 Type 2 diabetes mellitus with diabetic neuropathy, unspecified: Secondary | ICD-10-CM | POA: Insufficient documentation

## 2021-08-11 DIAGNOSIS — J449 Chronic obstructive pulmonary disease, unspecified: Secondary | ICD-10-CM | POA: Insufficient documentation

## 2021-08-11 DIAGNOSIS — R1010 Upper abdominal pain, unspecified: Secondary | ICD-10-CM | POA: Insufficient documentation

## 2021-08-11 DIAGNOSIS — E1165 Type 2 diabetes mellitus with hyperglycemia: Secondary | ICD-10-CM | POA: Insufficient documentation

## 2021-08-11 DIAGNOSIS — F17290 Nicotine dependence, other tobacco product, uncomplicated: Secondary | ICD-10-CM | POA: Diagnosis not present

## 2021-08-11 DIAGNOSIS — R109 Unspecified abdominal pain: Secondary | ICD-10-CM

## 2021-08-11 DIAGNOSIS — Z79899 Other long term (current) drug therapy: Secondary | ICD-10-CM | POA: Diagnosis not present

## 2021-08-11 LAB — COMPREHENSIVE METABOLIC PANEL
ALT: 38 U/L (ref 0–44)
AST: 56 U/L — ABNORMAL HIGH (ref 15–41)
Albumin: 2.9 g/dL — ABNORMAL LOW (ref 3.5–5.0)
Alkaline Phosphatase: 152 U/L — ABNORMAL HIGH (ref 38–126)
Anion gap: 9 (ref 5–15)
BUN: 7 mg/dL (ref 6–20)
CO2: 25 mmol/L (ref 22–32)
Calcium: 8.4 mg/dL — ABNORMAL LOW (ref 8.9–10.3)
Chloride: 100 mmol/L (ref 98–111)
Creatinine, Ser: 0.53 mg/dL (ref 0.44–1.00)
GFR, Estimated: >60 mL/min (ref >60)
Glucose, Bld: 252 mg/dL — ABNORMAL HIGH (ref 70–99)
Potassium: 3.7 mmol/L (ref 3.5–5.1)
Sodium: 134 mmol/L — ABNORMAL LOW (ref 135–145)
Total Bilirubin: 1 mg/dL (ref 0.3–1.2)
Total Protein: 6.9 g/dL (ref 6.5–8.1)

## 2021-08-11 LAB — CBC
HCT: 41.3 % (ref 36.0–46.0)
Hemoglobin: 13.3 g/dL (ref 12.0–15.0)
MCH: 31.5 pg (ref 26.0–34.0)
MCHC: 32.2 g/dL (ref 30.0–36.0)
MCV: 97.9 fL (ref 80.0–100.0)
Platelets: 83 10*3/uL — ABNORMAL LOW (ref 150–400)
RBC: 4.22 MIL/uL (ref 3.87–5.11)
RDW: 14.7 % (ref 11.5–15.5)
WBC: 5.2 10*3/uL (ref 4.0–10.5)
nRBC: 0 % (ref 0.0–0.2)

## 2021-08-11 LAB — HCG, QUANTITATIVE, PREGNANCY: hCG, Beta Chain, Quant, S: <1 m[IU]/mL (ref <5)

## 2021-08-11 LAB — CBG MONITORING, ED: Glucose-Capillary: 271 mg/dL — ABNORMAL HIGH (ref 70–99)

## 2021-08-11 LAB — LIPASE, BLOOD: Lipase: 22 U/L (ref 11–51)

## 2021-08-11 MED ORDER — SODIUM CHLORIDE 0.9 % IV SOLN
25.0000 mg | Freq: Four times a day (QID) | INTRAVENOUS | Status: DC | PRN
  Filled 2021-08-11: qty 1

## 2021-08-11 MED ORDER — ALUM & MAG HYDROXIDE-SIMETH 200-200-20 MG/5ML PO SUSP
30.0000 mL | Freq: Once | ORAL | Status: AC
  Administered 2021-08-11: 30 mL via ORAL
  Filled 2021-08-11: qty 30

## 2021-08-11 MED ORDER — ROSUVASTATIN CALCIUM 5 MG PO TABS: 5.0000 mg | ORAL_TABLET | Freq: Every day | ORAL | 3 refills | Status: DC

## 2021-08-11 MED ORDER — OXYCODONE HCL 5 MG PO TABS
5.0000 mg | ORAL_TABLET | Freq: Once | ORAL | Status: AC
  Administered 2021-08-11: 5 mg via ORAL
  Filled 2021-08-11: qty 1

## 2021-08-11 MED ORDER — LIDOCAINE VISCOUS HCL 2 % MT SOLN
15.0000 mL | Freq: Once | OROMUCOSAL | Status: AC
  Administered 2021-08-11: 15 mL via ORAL
  Filled 2021-08-11: qty 15

## 2021-08-11 MED ORDER — ONDANSETRON HCL 4 MG/2ML IJ SOLN
4.0000 mg | Freq: Once | INTRAMUSCULAR | Status: AC
  Administered 2021-08-11: 4 mg via INTRAVENOUS
  Filled 2021-08-11: qty 2

## 2021-08-11 MED ORDER — TOUJEO MAX SOLOSTAR 300 UNIT/ML ~~LOC~~ SOPN: 100.0000 [IU] | PEN_INJECTOR | Freq: Every day | SUBCUTANEOUS | 3 refills | Status: DC

## 2021-08-11 MED ORDER — SODIUM CHLORIDE 0.9 % IV BOLUS
1000.0000 mL | Freq: Once | INTRAVENOUS | Status: AC
  Administered 2021-08-11: 1000 mL via INTRAVENOUS

## 2021-08-11 MED ORDER — LYUMJEV KWIKPEN 200 UNIT/ML ~~LOC~~ SOPN: 24.0000 [IU] | PEN_INJECTOR | Freq: Three times a day (TID) | SUBCUTANEOUS | 3 refills | Status: DC

## 2021-08-11 NOTE — Telephone Encounter (Signed)
Pt's mother called and said that needs a new RX for her Rosuvastatin 44m, the new insluin that you put her on and the needles. They can pick these up on Thursday per the pharmacy since they are going on vacation Friday. [facility]

## 2021-08-11 NOTE — Discharge Instructions (Addendum)
Your work-up is stable.  Continue medications and follow-up with your specialist to schedule an EGD and colonoscopy.  Avoid alcohol, caffeine, spicy foods, NSAID medications. return to the ED if your pain significantly changes or worsens, you develop fever, intractable vomiting, any other concerns.

## 2021-08-11 NOTE — ED Notes (Signed)
Given water for fluid challenge.

## 2021-08-11 NOTE — ED Triage Notes (Signed)
Pt c/o abd pain that started about 1 hr PTA. Pt states she has been seen before for same and was told to follow up with GI.

## 2021-08-11 NOTE — ED Provider Notes (Signed)
[facility] Provider Note   CSN: [id number] Arrival date & time: 08/11/21  0454     History Chief Complaint  Patient presents with   Abdominal Pain    Jaime [name] is a 35 y.o. female.  Patient with a history of chronic abdominal pain, cirrhosis, hepatitis C, diabetes, ulcer disease presenting with upper abdominal pain that woke her from sleep.  This is a recurrent problem for her over the past 1 month that she has multiple evaluations for the same problem.  States the pain is constant and in the center of her abdomen, associated with 2 episodes of nausea and vomiting  this morning.  No diarrhea.  No fever.  No pain with urination or blood in the urine.  No chest pain or shortness of breath.  Pain is constant and worse at night and worse when she tries to eat.  She saw gastroenterology last month and is being scheduled for EGD and colonoscopy.  She has also had a reassuring ultrasound and CT scan within the past several weeks. She states there is no change in this pain she has been dealing with for the past 1 month coming and going.  She came in this morning because the pain is severe and she is having vomiting which happens occasionally with this pain.  The history is provided by the patient.  Abdominal Pain Associated symptoms: nausea and vomiting   Associated symptoms: no cough, no diarrhea, no dysuria, no hematuria and no shortness of breath       Past Medical History:  Diagnosis Date   Anxiety    Asthma    Chronic abdominal pain    Chronic back pain    Cirrhosis ([place])    Cirrhosis ([place])    COPD (chronic obstructive pulmonary disease) ([place])    Depression    Diabetes mellitus without complication ([place])    Diabetes mellitus, type II ([place])    Hepatitis C    Insomnia    Long-term current use of methadone for opiate dependence ([place])    Lupus ([place])    Migraine headache    Neuropathy    Nocturnal seizures ([place])    Peptic ulcer    Spleen enlarged      Patient Active Problem List   Diagnosis Date Noted   Rectal bleeding 07/28/2021   Esophageal dysphagia 07/28/2021   Melena 07/28/2021   Pain of upper abdomen 07/28/2021   Non-intractable vomiting 07/28/2021   Adjustment disorder with depressed mood 09/07/2020   Respiratory failure ([place]) 08/31/2020   Acute encephalopathy 08/31/2020   Hyponatremia 08/16/2020   Volume overload 08/14/2020   Dyspnea 07/29/2020   Nexplanon insertion 05/20/2020   Nexplanon removal 05/20/2020   Pressure injury of skin 01/29/2019   Uncontrolled diabetes mellitus 09/19/2018   Hyperglycemia 09/17/2018   Acute respiratory failure with hypoxia ([place]) 09/15/2018   Anxiety 09/15/2018   Chronic abdominal pain 09/15/2018   Diabetes mellitus without complication ([place]) 93/81/8299   Hepatitis C 09/15/2018   Peptic ulcer 09/15/2018   Long-term current use of methadone for opiate dependence ([place]) 09/15/2018   Cirrhosis ([place]) 09/15/2018   Acute hypoxemic respiratory failure ([place]) 07/17/2016   Pleural effusion on right 07/17/2016   Multiple rib fractures involving four or more ribs 07/17/2016   Hemothorax on right 07/17/2016   Chronic pain disorder 07/17/2016    Past Surgical History:  Procedure Laterality Date   CHOLECYSTECTOMY     ESOPHAGOGASTRODUODENOSCOPY  06/2020   done at [facility], candida in upper esophagus (  treated with diflucan), ulcerative esophagitis at GE junction, gastritis in stomach, single ulcer in duodenal bulb, with duodenal mucosa showing no abnormality. No presence of varices     OB History     Gravida  0   Para  0   Term  0   Preterm  0   AB  0   Living  0      SAB  0   IAB  0   Ectopic  0   Multiple  0   Live Births  0           Family History  Problem Relation Age of Onset   Hypertension Mother    Hyperlipidemia Mother    Hyperlipidemia Maternal Grandfather     Social History   Tobacco Use   Smoking status: Every Day    Types: E-cigarettes    Smokeless tobacco: Never  Vaping Use   Vaping Use: Every day   Substances: Nicotine, Flavoring  Substance Use Topics   Alcohol use: No   Drug use: No    Home Medications Prior to Admission medications   Medication Sig Start Date End Date Taking? Authorizing Provider  Accu-Chek Softclix Lancets lancets 4 (four) times daily. 07/07/20   [provider]  albuterol (ACCUNEB) 0.63 MG/3ML nebulizer solution Take by nebulization 4 (four) times daily. 07/27/20   [provider]  albuterol (PROVENTIL) (2.5 MG/3ML) 0.083% nebulizer solution Take 2.5 mg by nebulization every 6 (six) hours as needed for wheezing or shortness of breath.    [provider]  ARIPiprazole (ABILIFY) 5 MG tablet Take 5 mg by mouth daily. 06/20/21   [provider]  aspirin EC 81 MG tablet Take 162 mg by mouth daily. Swallow whole.    [provider]  cetirizine (ZYRTEC ALLERGY) 10 MG tablet Take 1 tablet (10 mg total) by mouth daily. 07/09/20   Avegno, Darrelyn Hillock, FNP  clonazePAM (KLONOPIN) 0.5 MG tablet Take 1 tablet (0.5 mg total) by mouth 2 (two) times daily as needed for anxiety. 09/09/20 07/28/21  Shelly Coss, MD  Continuous Blood Gluc Sensor (DEXCOM G6 SENSOR) MISC 4 Pieces by Does not apply route once a week. 08/07/21   Brita Romp, NP  Continuous Blood Gluc Transmit (DEXCOM G6 TRANSMITTER) MISC 1 Piece by Does not apply route as directed. 02/05/21   Brita Romp, NP  cromolyn (OPTICROM) 4 % ophthalmic solution 1 drop 2 (two) times daily. 01/28/21   [provider]  etonogestrel (NEXPLANON) 68 MG IMPL implant 1 each by Subdermal route once.    [provider]  furosemide (LASIX) 40 MG tablet Take 80 mg by mouth daily.  05/22/20   [provider]  glipiZIDE (GLUCOTROL XL) 5 MG 24 hr tablet Take 1 tablet (5 mg total) by mouth daily with breakfast. 07/30/21   Brita Romp, NP  GLOBAL EASE INJECT PEN NEEDLES 31G X 8 MM MISC USE 1 PEN NEEDLE  THREE TIMES DAILY. 06/30/21   Brita Romp, NP  glucose blood (ACCU-CHEK GUIDE) test strip Use as instructed to check blood glucose four times daily 05/20/21   Brita Romp, NP  hyoscyamine (ANASPAZ) 0.125 MG TBDP disintergrating tablet Place 1 tablet (0.125 mg total) under the tongue 3 (three) times daily as needed for bladder spasms or cramping (Patient Preference). 07/28/21   Carlan, Chelsea L, NP  insulin glargine, 2 Unit Dial, (TOUJEO MAX SOLOSTAR) 300 UNIT/ML Solostar Pen Inject 100 Units into the skin at bedtime. 08/07/21  Brita Romp, NP  Insulin Lispro-aabc (LYUMJEV KWIKPEN) 200 UNIT/ML KwikPen Inject 24-30 Units into the skin 3 (three) times daily with meals. 08/07/21   Brita Romp, NP  Insulin Pen Needle (PEN NEEDLES) 32G X 6 MM MISC 1 each by Does not apply route 3 (three) times daily. 02/26/21   Brita Romp, NP  LACTULOSE PO Take by mouth. 10gm/15 ml - 30 mls  three times a day    [provider]  liraglutide (VICTOZA) 18 MG/3ML SOPN Inject 1.8 mg into the skin daily. 07/30/21   Brita Romp, NP  lisinopril (ZESTRIL) 5 MG tablet TAKE 1 TABLET ONCE DAILY. 06/30/21   Brita Romp, NP  metFORMIN (GLUCOPHAGE) 1000 MG tablet Take 1 tablet (1,000 mg total) by mouth 2 (two) times daily with a meal. 07/30/21   Brita Romp, NP  methadone (DOLOPHINE) 10 MG/5ML solution Take 60 mg by mouth every morning. 60m    [provider]  Multiple Vitamin (MULTIVITAMIN) tablet Take 1 tablet by mouth daily.    [provider]  nystatin (MYCOSTATIN/NYSTOP) powder Apply 1 application topically 3 (three) times daily. 05/19/21   Mesner, JCorene Cornea MD  omeprazole (PRILOSEC) 40 MG capsule Take 1 capsule (40 mg total) by mouth daily. 07/28/21   Carlan, Chelsea L, NP  ondansetron (ZOFRAN-ODT) 4 MG disintegrating tablet Take 4 mg by mouth every 8 (eight) hours as needed for nausea.  06/14/20   [provider]  oxyCODONE (ROXICODONE) 5 MG immediate  release tablet Take 1 tablet (5 mg total) by mouth every 12 (twelve) hours as needed for breakthrough pain or severe pain. 05/19/21   Mesner, JCorene Cornea MD  potassium chloride (KLOR-CON) 10 MEQ tablet Take 20 mEq by mouth daily.  04/24/20   [provider]  pregabalin (LYRICA) 75 MG capsule Take 75 mg by mouth in the morning, at noon, in the evening, and at bedtime.     [provider]  PROAIR HFA 108 [phone]) MCG/ACT inhaler SMARTSIG:2 Puff(s) By Mouth Every 6 Hours PRN 04/04/21   [provider]  propranolol (INDERAL) 10 MG tablet Take 10 mg by mouth 3 (three) times daily. 08/08/20   [provider]  RESTASIS 0.05 % ophthalmic emulsion Place 1 drop into both eyes as needed (dry eye).  04/22/19   [provider]  rosuvastatin (CRESTOR) 5 MG tablet Take 1 tablet (5 mg total) by mouth daily. 08/06/20   RBrita Romp NP  spironolactone (ALDACTONE) 100 MG tablet Take 100 mg by mouth daily.  05/22/20   [provider]  sucralfate (CARAFATE) 1 g tablet Take 1 g by mouth 4 (four) times daily. 07/02/20   [provider]  triamcinolone (KENALOG) 0.1 % Apply topically daily. 02/04/21   [provider]  venlafaxine XR (EFFEXOR-XR) 150 MG 24 hr capsule Take total of 225 mg daily (150 mg + 75 mg ) 01/24/21   HNorman Clay MD  venlafaxine XR (EFFEXOR-XR) 75 MG 24 hr capsule To take along with 1569mfor a total of 225 mg daily 01/20/21   HiNorman ClayMD    Allergies    Iodine-131, Ivp dye [iodinated diagnostic agents], Ketorolac tromethamine, Tylenol [acetaminophen], Gabapentin, Morphine and related, Nsaids, Suboxone [buprenorphine hcl-naloxone hcl], and Vancomycin  Review of Systems   Review of Systems  Constitutional:  Positive for appetite change. Negative for activity change.  HENT:  Negative for congestion and rhinorrhea.   Respiratory:  Negative for cough, chest tightness and shortness of  breath.   Gastrointestinal:  Positive for  abdominal pain, nausea and vomiting. Negative for diarrhea.  Genitourinary:  Negative for dysuria and hematuria.  Musculoskeletal:  Negative for arthralgias and myalgias.  Skin:  Negative for rash.  Neurological:  Negative for dizziness, weakness and headaches.   all other systems are negative except as noted in the HPI and PMH.   Physical Exam Updated Vital Signs BP (!) 147/78 (BP Location: Right Arm)   Pulse 100   Temp 98.1 F (36.7 C) (Oral)   Resp 17   Ht 5' 9"  (1.753 m)   Wt 132.9 kg   SpO2 96%   BMI 43.27 kg/m   Physical Exam Vitals and nursing note reviewed.  Constitutional:      General: She is not in acute distress.    Appearance: She is well-developed. She is obese.  HENT:     Head: Normocephalic and atraumatic.     Mouth/Throat:     Pharynx: No oropharyngeal exudate.  Eyes:     Conjunctiva/sclera: Conjunctivae normal.     Pupils: Pupils are equal, round, and reactive to light.  Neck:     Comments: No meningismus. Cardiovascular:     Rate and Rhythm: Normal rate and regular rhythm.     Heart sounds: Normal heart sounds. No murmur heard. Pulmonary:     Effort: Pulmonary effort is normal. No respiratory distress.     Breath sounds: Normal breath sounds.  Abdominal:     Palpations: Abdomen is soft.     Tenderness: There is abdominal tenderness. There is no guarding or rebound.     Comments: Central abdominal tenderness, no guarding or rebound  Musculoskeletal:        General: No tenderness. Normal range of motion.     Cervical back: Normal range of motion and neck supple.  Skin:    General: Skin is warm.  Neurological:     Mental Status: She is alert and oriented to person, place, and time.     Cranial Nerves: No cranial nerve deficit.     Motor: No abnormal muscle tone.     Coordination: Coordination normal.     Comments:  5/5 strength throughout. CN 2-12 intact.Equal grip strength.   Psychiatric:        Behavior: Behavior normal.    ED Results /  Procedures / Treatments   Labs (all labs ordered are listed, but only abnormal results are displayed) Labs Reviewed  COMPREHENSIVE METABOLIC PANEL - Abnormal; Notable for the following components:      Result Value   Sodium 134 (*)    Glucose, Bld 252 (*)    Calcium 8.4 (*)    Albumin 2.9 (*)    AST 56 (*)    Alkaline Phosphatase 152 (*)    All other components within normal limits  CBC - Abnormal; Notable for the following components:   Platelets 83 (*)    All other components within normal limits  CBG MONITORING, ED - Abnormal; Notable for the following components:   Glucose-Capillary 271 (*)    All other components within normal limits  LIPASE, BLOOD  HCG, QUANTITATIVE, PREGNANCY  URINALYSIS, ROUTINE W REFLEX MICROSCOPIC    EKG None  Radiology CT ABDOMEN PELVIS WO CONTRAST  Result Date: 08/11/2021 CLINICAL DATA:  35 year old female with acute abdominal pain. Cirrhosis with portal venous hypertension. EXAM: CT ABDOMEN AND PELVIS WITHOUT CONTRAST TECHNIQUE: Multidetector CT imaging of the abdomen and pelvis was performed following the standard protocol without IV  contrast. COMPARISON:  CT Abdomen and Pelvis 07/29/2021, and earlier. FINDINGS: Lower chest: Mild cardiomegaly and lung base atelectasis not significantly changed. No pleural or pericardial effusion. Hepatobiliary: Cirrhotic, nodular liver with hepatomegaly. Surgically absent gallbladder. Recanalized paraumbilical vein and abdominal, mesenteric varices. Pancreas: Fatty atrophy. Spleen: Chronic splenomegaly and spleno renal varices. Adrenals/Urinary Tract: Negative adrenal glands. Nonobstructed kidneys. Decompressed ureters. Decompressed bladder. No urinary calculus identified. Stomach/Bowel: Small volume free fluid with simple fluid density in the lower gutters. Redundant large bowel with increased retained stool compared to last month. No discrete large bowel inflammation. Appendix is diminutive or absent. Negative terminal  ileum. No dilated small bowel. Increased food distended stomach. Stable duodenum. No free air. Ground-glass opacity throughout the root of the mesentery is stable and likely mesenteric congestion. Vascular/Lymphatic: Normal caliber abdominal aorta. Porta hepatis lymphadenopathy appears stable. Lesser generalized mesenteric lymphadenopathy also appears stable. Reproductive: Negative noncontrast appearance. Other: No free fluid in the pelvis. Musculoskeletal: Lower thoracic spine posterior element degeneration. Stable visualized osseous structures. IMPRESSION: 1. Stable noncontrast CT appearance of Cirrhosis and stigmata of portal venous hypertension (splenomegaly, abdominal varices, reactive appearing lymphadenopathy in the porta hepatis and mesentery). Trace ascites. 2. Increased retained stool in redundant large bowel but no new abnormality identified in the non-contrast abdomen or pelvis. 3. Mild cardiomegaly and lung base atelectasis. Electronically Signed   By: Genevie Ann M.D.   On: 08/11/2021 07:09    Procedures Procedures   Medications Ordered in ED Medications  promethazine (PHENERGAN) 25 mg in sodium chloride 0.9 % 50 mL IVPB (has no administration in time range)  alum & mag hydroxide-simeth (MAALOX/MYLANTA) 200-200-20 MG/5ML suspension 30 mL (has no administration in time range)    And  lidocaine (XYLOCAINE) 2 % viscous mouth solution 15 mL (has no administration in time range)  sodium chloride 0.9 % bolus 1,000 mL (has no administration in time range)    ED Course  I have reviewed the triage vital signs and the nursing notes.  Pertinent labs & imaging results that were available during my care of the patient were reviewed by me and considered in my medical decision making (see chart for details).    MDM Rules/Calculators/A&P                          Acute on chronic abdominal pain with nausea and vomiting.  Vitals stable with mild tachycardia.  Abdomen soft without peritoneal  signs.  Recent CT scan and ultrasound reviewed.  Patient given fluids and symptom control.  We will hold off on narcotics given chronic nature of the pain.  Labs show hyperglycemia without evidence of DKA.  LFTs and lipase at baseline.  No leukocytosis  CT with stable findings of cirrhosis and constipation.  No bowel obstruction.  Patient reassured.  She is tolerating p.o.  Abdomen is soft without peritoneal signs.  Discussed avoiding alcohol, caffeine, NSAID medications, spicy foods.  Continue her PPI.  Follow-up with her gastroenterologist as scheduled for EGD and colonoscopy. Return to the ED with significantly changing or worsening pain, intractable vomiting, fever, or any other concerns. Final Clinical Impression(s) / ED Diagnoses Final diagnoses:  Recurrent abdominal pain    Rx / DC Orders ED Discharge Orders     None        Makensie Mulhall, Annie Main, MD 08/11/21 0830

## 2021-08-13 ENCOUNTER — Ambulatory Visit (INDEPENDENT_AMBULATORY_CARE_PROVIDER_SITE_OTHER): Payer: Medicaid Other | Admitting: Nurse Practitioner

## 2021-08-13 ENCOUNTER — Other Ambulatory Visit: Payer: Self-pay | Admitting: Nurse Practitioner

## 2021-08-13 ENCOUNTER — Encounter: Payer: Self-pay | Admitting: Nurse Practitioner

## 2021-08-13 ENCOUNTER — Other Ambulatory Visit: Payer: Self-pay

## 2021-08-13 VITALS — BP 114/69 | HR 87 | Ht 69.0 in | Wt 304.0 lb

## 2021-08-13 DIAGNOSIS — E1165 Type 2 diabetes mellitus with hyperglycemia: Secondary | ICD-10-CM | POA: Diagnosis not present

## 2021-08-13 DIAGNOSIS — Z91119 Patient's noncompliance with dietary regimen due to unspecified reason: Secondary | ICD-10-CM | POA: Diagnosis not present

## 2021-08-13 DIAGNOSIS — E782 Mixed hyperlipidemia: Secondary | ICD-10-CM

## 2021-08-13 MED ORDER — LYUMJEV KWIKPEN 200 UNIT/ML ~~LOC~~ SOPN: 28.0000 [IU] | PEN_INJECTOR | Freq: Three times a day (TID) | SUBCUTANEOUS | 3 refills | Status: DC

## 2021-08-13 MED ORDER — TOUJEO MAX SOLOSTAR 300 UNIT/ML ~~LOC~~ SOPN: 120.0000 [IU] | PEN_INJECTOR | Freq: Every day | SUBCUTANEOUS | 3 refills | Status: DC

## 2021-08-13 NOTE — Patient Instructions (Signed)

## 2021-08-13 NOTE — Progress Notes (Signed)
08/13/2021, 10:49 AM     Endocrinology Follow Up Visit  Subjective:    Patient ID: Jaime [name], female    DOB: August 02, 1986.  Jaime [name] is being seen in follow up for management of currently uncontrolled symptomatic diabetes requested by  Neale Burly, MD.   Past Medical History:  Diagnosis Date   Anxiety    Asthma    Chronic abdominal pain    Chronic back pain    Cirrhosis ([place])    Cirrhosis ([place])    COPD (chronic obstructive pulmonary disease) ([place])    Depression    Diabetes mellitus without complication ([place])    Diabetes mellitus, type II ([place])    Hepatitis C    Insomnia    Long-term current use of methadone for opiate dependence ([place])    Lupus ([place])    Migraine headache    Neuropathy    Nocturnal seizures ([place])    Peptic ulcer    Spleen enlarged     Past Surgical History:  Procedure Laterality Date   CHOLECYSTECTOMY     ESOPHAGOGASTRODUODENOSCOPY  06/2020   done at [facility], candida in upper esophagus (treated with diflucan), ulcerative esophagitis at GE junction, gastritis in stomach, single ulcer in duodenal bulb, with duodenal mucosa showing no abnormality. No presence of varices    Social History   Socioeconomic History   Marital status: Single    Spouse name: Not on file   Number of children: Not on file   Years of education: Not on file   Highest education level: Not on file  Occupational History   Not on file  Tobacco Use   Smoking status: Every Day    Types: E-cigarettes   Smokeless tobacco: Never  Vaping Use   Vaping Use: Every day   Substances: Nicotine, Flavoring  Substance and Sexual Activity   Alcohol use: No   Drug use: No   Sexual activity: Not Currently    Birth control/protection: Implant  Other Topics Concern   Not on file  Social History Narrative   Not on file   Social Determinants of Health   Financial Resource Strain: Not on file   Food Insecurity: Not on file  Transportation Needs: Not on file  Physical Activity: Not on file  Stress: Not on file  Social Connections: Not on file    Family History  Problem Relation Age of Onset   Hypertension Mother    Hyperlipidemia Mother    Hyperlipidemia Maternal Grandfather     Outpatient Encounter Medications as of 08/13/2021  Medication Sig   Accu-Chek Softclix Lancets lancets 4 (four) times daily.   albuterol (ACCUNEB) 0.63 MG/3ML nebulizer solution Take by nebulization 4 (four) times daily.   albuterol (PROVENTIL) (2.5 MG/3ML) 0.083% nebulizer solution Take 2.5 mg by nebulization every 6 (six) hours as needed for wheezing or shortness of breath.   ARIPiprazole (ABILIFY) 5 MG tablet Take 5 mg by mouth daily.   aspirin EC 81 MG tablet Take 162 mg by mouth daily. Swallow whole.   cetirizine (ZYRTEC ALLERGY) 10 MG tablet Take 1 tablet (10 mg total) by mouth daily.   Continuous  Blood Gluc Sensor (DEXCOM G6 SENSOR) MISC 4 Pieces by Does not apply route once a week.   Continuous Blood Gluc Transmit (DEXCOM G6 TRANSMITTER) MISC 1 Piece by Does not apply route as directed.   cromolyn (OPTICROM) 4 % ophthalmic solution 1 drop 2 (two) times daily.   etonogestrel (NEXPLANON) 68 MG IMPL implant 1 each by Subdermal route once.   furosemide (LASIX) 40 MG tablet Take 80 mg by mouth daily.    glipiZIDE (GLUCOTROL XL) 5 MG 24 hr tablet Take 1 tablet (5 mg total) by mouth daily with breakfast.   GLOBAL EASE INJECT PEN NEEDLES 31G X 8 MM MISC USE 1 PEN NEEDLE THREE TIMES DAILY.   glucose blood (ACCU-CHEK GUIDE) test strip Use as instructed to check blood glucose four times daily   hyoscyamine (ANASPAZ) 0.125 MG TBDP disintergrating tablet Place 1 tablet (0.125 mg total) under the tongue 3 (three) times daily as needed for bladder spasms or cramping (Patient Preference).   insulin glargine, 2 Unit Dial, (TOUJEO MAX SOLOSTAR) 300 UNIT/ML Solostar Pen Inject 100 Units into the skin at  bedtime.   Insulin Lispro-aabc (LYUMJEV KWIKPEN) 200 UNIT/ML KwikPen Inject 24-30 Units into the skin 3 (three) times daily with meals.   Insulin Pen Needle (PEN NEEDLES) 32G X 6 MM MISC 1 each by Does not apply route 3 (three) times daily.   LACTULOSE PO Take by mouth. 10gm/15 ml - 30 mls  three times a day   liraglutide (VICTOZA) 18 MG/3ML SOPN Inject 1.8 mg into the skin daily.   lisinopril (ZESTRIL) 5 MG tablet TAKE 1 TABLET ONCE DAILY.   metFORMIN (GLUCOPHAGE) 1000 MG tablet Take 1 tablet (1,000 mg total) by mouth 2 (two) times daily with a meal.   methadone (DOLOPHINE) 10 MG/5ML solution Take 60 mg by mouth every morning. 34m   Multiple Vitamin (MULTIVITAMIN) tablet Take 1 tablet by mouth daily.   nystatin (MYCOSTATIN/NYSTOP) powder Apply 1 application topically 3 (three) times daily.   omeprazole (PRILOSEC) 40 MG capsule Take 1 capsule (40 mg total) by mouth daily.   ondansetron (ZOFRAN-ODT) 4 MG disintegrating tablet Take 4 mg by mouth every 8 (eight) hours as needed for nausea.    oxyCODONE (ROXICODONE) 5 MG immediate release tablet Take 1 tablet (5 mg total) by mouth every 12 (twelve) hours as needed for breakthrough pain or severe pain.   potassium chloride (KLOR-CON) 10 MEQ tablet Take 20 mEq by mouth daily.    pregabalin (LYRICA) 75 MG capsule Take 75 mg by mouth in the morning, at noon, in the evening, and at bedtime.    PROAIR HFA 108 (90 Base) MCG/ACT inhaler SMARTSIG:2 Puff(s) By Mouth Every 6 Hours PRN   propranolol (INDERAL) 10 MG tablet Take 10 mg by mouth 3 (three) times daily.   RESTASIS 0.05 % ophthalmic emulsion Place 1 drop into both eyes as needed (dry eye).    rosuvastatin (CRESTOR) 5 MG tablet Take 1 tablet (5 mg total) by mouth daily.   spironolactone (ALDACTONE) 100 MG tablet Take 100 mg by mouth daily.    sucralfate (CARAFATE) 1 g tablet Take 1 g by mouth 4 (four) times daily.   triamcinolone (KENALOG) 0.1 % Apply topically daily.   venlafaxine XR (EFFEXOR-XR)  150 MG 24 hr capsule Take total of 225 mg daily (150 mg + 75 mg )   venlafaxine XR (EFFEXOR-XR) 75 MG 24 hr capsule To take along with 1536mfor a total of 225 mg daily   clonazePAM (KLONOPIN)  0.5 MG tablet Take 1 tablet (0.5 mg total) by mouth 2 (two) times daily as needed for anxiety.   No facility-administered encounter medications on file as of 08/13/2021.    ALLERGIES: Allergies  Allergen Reactions   Iodine-131 Anaphylaxis, Shortness Of Breath and Swelling   Ivp Dye [Iodinated Diagnostic Agents] Anaphylaxis    Skin gets very red, unable to walk    Ketorolac Tromethamine Shortness Of Breath   Tylenol [Acetaminophen] Other (See Comments)    Due to cirrhosis    Gabapentin    Morphine And Related     Pt says she is not allergic to Morphine 08/12/20   Nsaids Other (See Comments)    Flares ulcers   Suboxone [Buprenorphine Hcl-Naloxone Hcl]    Vancomycin Swelling    Facial swelling,     VACCINATION STATUS: Immunization History  Administered Date(s) Administered   Influenza,inj,Quad PF,6+ Mos 07/18/2016   Moderna Sars-Covid-2 Vaccination 03/07/2020, 04/04/2020   Pneumococcal Polysaccharide-23 07/18/2016, 08/01/2020    Diabetes She presents for her follow-up diabetic visit. She has type 2 diabetes mellitus. Onset time: She was diagnosed at approximate age of 4 years. Her disease course has been improving. There are no hypoglycemic associated symptoms. Pertinent negatives for hypoglycemia include no headaches, nervousness/anxiousness, pallor, sweats or tremors. Associated symptoms include blurred vision, fatigue, foot paresthesias, polydipsia and polyuria. Pertinent negatives for diabetes include no chest pain, no polyphagia and no weight loss. There are no hypoglycemic complications. (History of unresponsiveness requiring EMS and hospitalization- none recent) Symptoms are stable. Diabetic complications include heart disease and peripheral neuropathy. Risk factors for coronary artery  disease include diabetes mellitus, obesity, sedentary lifestyle, dyslipidemia and hypertension. Current diabetic treatment includes insulin injections and oral agent (monotherapy). She is compliant with treatment most of the time. Her weight is fluctuating minimally. She is following a low salt and generally unhealthy diet. When asked about meal planning, she reported none. She has had a previous visit with a dietitian. She participates in exercise intermittently. Her home blood glucose trend is increasing rapidly. Her breakfast blood glucose range is generally >200 mg/dl. Her lunch blood glucose range is generally >200 mg/dl. Her dinner blood glucose range is generally >200 mg/dl. Her bedtime blood glucose range is generally >200 mg/dl. Her overall blood glucose range is >200 mg/dl. (She presents today, accompanied by her mother, with her CGM, no logs showing slight improvement in her glycemic profile, yet still significantly above target.  Analysis of her CGM shows TIR 5%, TAR 95%, TBR 0%.  She continues to work on her diet. ) An ACE inhibitor/angiotensin II receptor blocker is being taken. She does not see a podiatrist.Eye exam is not current.  Hypertension This is a chronic problem. The current episode started more than 1 month ago. The problem is unchanged. The problem is controlled. Associated symptoms include blurred vision and peripheral edema. Pertinent negatives include no chest pain, headaches, palpitations, shortness of breath or sweats. There are no associated agents to hypertension. Risk factors for coronary artery disease include diabetes mellitus, dyslipidemia, obesity and sedentary lifestyle. Past treatments include diuretics and beta blockers. The current treatment provides no improvement. There are no compliance problems.     Review of systems  Constitutional: + Minimally fluctuating body weight,  current Body mass index is 44.89 kg/m. , + fatigue, no subjective hyperthermia, no subjective  hypothermia Eyes: no blurry vision, no xerophthalmia ENT: no sore throat, no nodules palpated in throat, no dysphagia/odynophagia, no hoarseness Cardiovascular: no chest pain, no shortness of breath,  no palpitations, + leg swelling Respiratory: no cough, no shortness of breath Gastrointestinal: no nausea/vomiting/diarrhea Genitourinary: + polyuria Musculoskeletal: no muscle/joint aches Skin: no rashes, no hyperemia Neurological: no tremors, no numbness, no tingling, no dizziness Psychiatric: no depression, no anxiety  Objective:     BP 114/69   Pulse 87   Ht 5' 9"  (1.753 m)   Wt (!) 304 lb (137.9 kg)   BMI 44.89 kg/m   Wt Readings from Last 3 Encounters:  08/13/21 (!) 304 lb (137.9 kg)  08/11/21 293 lb (132.9 kg)  08/07/21 (!) 305 lb 3.2 oz (138.4 kg)    BP Readings from Last 3 Encounters:  08/13/21 114/69  08/11/21 138/80  08/07/21 127/75     Physical Exam- Limited  Constitutional:  Body mass index is 44.89 kg/m. , not in acute distress, normal state of mind, Eyes:  EOMI, no exophthalmos Neck: Supple Cardiovascular: RRR, no murmurs, rubs, or gallops, no edema Respiratory: Adequate breathing efforts, no crackles, rales, rhonchi, or wheezing Musculoskeletal: no gross deformities, strength intact in all four extremities, no gross restriction of joint movements Skin:  no rashes, no hyperemia Neurological: no tremor with outstretched hands    CMP ( most recent) CMP     Component Value Date/Time   NA 134 (L) 08/11/2021 0531   NA 135 02/19/2021 0822   K 3.7 08/11/2021 0531   CL 100 08/11/2021 0531   CO2 25 08/11/2021 0531   GLUCOSE 252 (H) 08/11/2021 0531   BUN 7 08/11/2021 0531   BUN 6 02/19/2021 0822   CREATININE 0.53 08/11/2021 0531   CALCIUM 8.4 (L) 08/11/2021 0531   PROT 6.9 08/11/2021 0531   PROT 7.0 02/19/2021 0822   ALBUMIN 2.9 (L) 08/11/2021 0531   ALBUMIN 3.6 (L) 02/19/2021 0822   AST 56 (H) 08/11/2021 0531   ALT 38 08/11/2021 0531   ALKPHOS 152  (H) 08/11/2021 0531   BILITOT 1.0 08/11/2021 0531   BILITOT 0.5 02/19/2021 0822   GFRNONAA >60 08/11/2021 0531   GFRAA >60 08/01/2020 0704     Diabetic Labs (most recent): Lab Results  Component Value Date   HGBA1C 11.4 (H) 07/29/2021   HGBA1C 10.3 (A) 05/28/2021   HGBA1C 8.9 (A) 02/26/2021     Lipid Panel ( most recent) Lipid Panel     Component Value Date/Time   CHOL 210 (H) 07/23/2020 0842   TRIG 86 08/12/2020 1023   HDL 60 07/23/2020 0842   CHOLHDL 3.5 07/23/2020 0842   LDLCALC 116 (H) 07/23/2020 0842   LABVLDL 34 07/23/2020 0842      Lab Results  Component Value Date   TSH 0.635 09/08/2020   TSH 0.993 07/30/2020   TSH 1.990 07/23/2020   TSH 1.690 08/22/2010   FREET4 0.72 (L) 07/23/2020   FREET4 1.11 08/22/2010      Assessment & Plan:   1) Type 2 diabetes mellitus with hyperglycemia, without long-term current use of insulin (HCC)  - Halle Davlin has currently uncontrolled symptomatic type 2 DM since  35 years of age.  She presents today, accompanied by her mother, with her CGM, no logs showing slight improvement in her glycemic profile, yet still significantly above target.  Analysis of her CGM shows TIR 5%, TAR 95%, TBR 0%.  She continues to work on her diet.   - I had a long discussion with her about the progressive nature of diabetes and the pathology behind its complications.  -her diabetes is complicated by obesity/sedentary life and she remains at a  high risk for more acute and chronic complications which include CAD, CVA, CKD, retinopathy, and neuropathy. These are all discussed in detail with her.  - Nutritional counseling repeated at each appointment due to patients tendency to fall back in to old habits.  - The patient admits there is a room for improvement in their diet and drink choices. -  Suggestion is made for the patient to avoid simple carbohydrates from their diet including Cakes, Sweet Desserts / Pastries, Ice Cream, Soda (diet and  regular), Sweet Tea, Candies, Chips, Cookies, Sweet Pastries, Store Bought Juices, Alcohol in Excess of 1-2 drinks a day, Artificial Sweeteners, Coffee Creamer, and "Sugar-free" Products. This will help patient to have stable blood glucose profile and potentially avoid unintended weight gain.   - I encouraged the patient to switch to unprocessed or minimally processed complex starch and increased protein intake (animal or plant source), fruits, and vegetables.   - Patient is advised to stick to a routine mealtimes to eat 3 meals a day and avoid unnecessary snacks (to snack only to correct hypoglycemia).  - I have approached her with the following individualized plan to manage  her diabetes and patient agrees:   -Since her recent hospitalization, her mother has assumed responsibility for her diabetes management.  Number 1 priority will be to avoid hypoglycemia for her given her comorbidities.    -She will tolerate increase in her Toujeo to 100 units SQ nightly and increase in her Lyumjev to 24-30 units TID with meals if glucose is above 90 and she is eating (Specific instructions on how to titrate insulin dosage based on glucose readings given to patient in writing).  She can continue Victoza 1.8 mg SQ daily, Metformin 1000 mg po twice daily with meals, and Glipizide 5 mg XL daily with breakfast.  We discussed in detail, again, the need for her to comply with dietary restrictions.  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  She could benefit from seeking out a counselor for positive coping mechanisms and is advised to discuss with her PCP.  Will have her return in 1 week to make further adjustments before she goes on her trip out of town.  -She is encouraged to continue to monitor glucose 4 times daily (using her CGM), before meals and before bed and log on the clinic sheets provided.  They are instructed to call the clinic if  she has readings less than 70 or greater than 300 for 3 tests in a row.     - Specific targets for  A1c;  LDL, HDL,  and Triglycerides were discussed with the patient.  2) Blood Pressure /Hypertension: Her blood pressure is controlled to target.  She is advised to continue Lasix 80 mg po daily, continue Lisinopril 5 mg po daily, continue Propanolol 10 mg po TID, and Aldactone 100 mg o daily  3) Lipids/Hyperlipidemia:   Review of her recent lipid profile from 07/23/20 shows uncontrolled LDL at 116 and elevated triglycerides of 197.  She is advised to continue Crestor 5 mg po daily at bedtime.  Side effects and precautions discussed with her.  Will recheck lipid panel on subsequent visits.  4)  Weight/Diet:  Her Body mass index is 44.89 kg/m.  -   clearly complicating her diabetes care.  She has recently lost 8 lbs. she is  a candidate for modest weight loss. I discussed with her the fact that  loss of 5 - 10% of her  current body weight will have the most impact on her diabetes management.  Exercise, and detailed carbohydrates information provided  -  detailed on discharge instructions.  5) Chronic Care/Health Maintenance: -she is on ACE and statin medications and is encouraged to initiate and continue to follow up with Ophthalmology, Dentist,  Podiatrist at least yearly or according to recommendations, and advised to stay away from smoking. I have recommended yearly flu vaccine and pneumonia vaccine at least every 5 years; moderate intensity exercise for up to 150 minutes weekly; and  sleep for at least 7 hours a day.  - she is advised to maintain close follow up with Hasanaj, Samul Dada, MD for primary care needs, as well as her other providers for optimal and coordinated care.       I spent 46 minutes in the care of the patient today including review of labs from [place], Lipids, Thyroid Function, Hematology (current and previous including abstractions from other facilities); face-to-face time  discussing  her blood glucose readings/logs, discussing hypoglycemia and hyperglycemia episodes and symptoms, medications doses, her options of short and long term treatment based on the latest standards of care / guidelines;  discussion about incorporating lifestyle medicine;  and documenting the encounter.    Please refer to Patient Instructions for Blood Glucose Monitoring and Insulin/Medications Dosing Guide"  in media tab for additional information. Please  also refer to " Patient Self Inventory" in the Media  tab for reviewed elements of pertinent patient history.  Remus Blake participated in the discussions, expressed understanding, and voiced agreement with the above plans.  All questions were answered to her satisfaction. she is encouraged to contact clinic should she have any questions or concerns prior to her return visit.    Follow up plan: - No follow-ups on file.    Rayetta Pigg, [facility] [address] [place], [place] [address] Phone: [phone] Fax: [phone]   08/13/2021, 10:49 AM

## 2021-08-13 NOTE — Progress Notes (Signed)
08/13/2021, 11:15 AM     Endocrinology Follow Up Visit  Subjective:    Patient ID: Jaime [name], female    DOB: 11-Aug-1986.  Jaime [name] is being seen in follow up for management of currently uncontrolled symptomatic diabetes requested by  Neale Burly, MD.   Past Medical History:  Diagnosis Date   Anxiety    Asthma    Chronic abdominal pain    Chronic back pain    Cirrhosis ([place])    Cirrhosis ([place])    COPD (chronic obstructive pulmonary disease) ([place])    Depression    Diabetes mellitus without complication ([place])    Diabetes mellitus, type II ([place])    Hepatitis C    Insomnia    Long-term current use of methadone for opiate dependence ([place])    Lupus ([place])    Migraine headache    Neuropathy    Nocturnal seizures ([place])    Peptic ulcer    Spleen enlarged     Past Surgical History:  Procedure Laterality Date   CHOLECYSTECTOMY     ESOPHAGOGASTRODUODENOSCOPY  06/2020   done at [facility], candida in upper esophagus (treated with diflucan), ulcerative esophagitis at GE junction, gastritis in stomach, single ulcer in duodenal bulb, with duodenal mucosa showing no abnormality. No presence of varices    Social History   Socioeconomic History   Marital status: Single    Spouse name: Not on file   Number of children: Not on file   Years of education: Not on file   Highest education level: Not on file  Occupational History   Not on file  Tobacco Use   Smoking status: Every Day    Types: E-cigarettes   Smokeless tobacco: Never  Vaping Use   Vaping Use: Every day   Substances: Nicotine, Flavoring  Substance and Sexual Activity   Alcohol use: No   Drug use: No   Sexual activity: Not Currently    Birth control/protection: Implant  Other Topics Concern   Not on file  Social History Narrative   Not on file   Social Determinants of Health   Financial Resource Strain: Not on file   Food Insecurity: Not on file  Transportation Needs: Not on file  Physical Activity: Not on file  Stress: Not on file  Social Connections: Not on file    Family History  Problem Relation Age of Onset   Hypertension Mother    Hyperlipidemia Mother    Hyperlipidemia Maternal Grandfather     Outpatient Encounter Medications as of 08/13/2021  Medication Sig   Accu-Chek Softclix Lancets lancets 4 (four) times daily.   albuterol (ACCUNEB) 0.63 MG/3ML nebulizer solution Take by nebulization 4 (four) times daily.   albuterol (PROVENTIL) (2.5 MG/3ML) 0.083% nebulizer solution Take 2.5 mg by nebulization every 6 (six) hours as needed for wheezing or shortness of breath.   ARIPiprazole (ABILIFY) 5 MG tablet Take 5 mg by mouth daily.   aspirin EC 81 MG tablet Take 162 mg by mouth daily. Swallow whole.   cetirizine (ZYRTEC ALLERGY) 10 MG tablet Take 1 tablet (10 mg total) by mouth daily.   Continuous  Blood Gluc Sensor (DEXCOM G6 SENSOR) MISC 4 Pieces by Does not apply route once a week.   Continuous Blood Gluc Transmit (DEXCOM G6 TRANSMITTER) MISC 1 Piece by Does not apply route as directed.   cromolyn (OPTICROM) 4 % ophthalmic solution 1 drop 2 (two) times daily.   etonogestrel (NEXPLANON) 68 MG IMPL implant 1 each by Subdermal route once.   furosemide (LASIX) 40 MG tablet Take 80 mg by mouth daily.    glipiZIDE (GLUCOTROL XL) 5 MG 24 hr tablet Take 1 tablet (5 mg total) by mouth daily with breakfast.   GLOBAL EASE INJECT PEN NEEDLES 31G X 8 MM MISC USE 1 PEN NEEDLE THREE TIMES DAILY.   glucose blood (ACCU-CHEK GUIDE) test strip Use as instructed to check blood glucose four times daily   hyoscyamine (ANASPAZ) 0.125 MG TBDP disintergrating tablet Place 1 tablet (0.125 mg total) under the tongue 3 (three) times daily as needed for bladder spasms or cramping (Patient Preference).   Insulin Pen Needle (PEN NEEDLES) 32G X 6 MM MISC 1 each by Does not apply route 3 (three) times daily.   LACTULOSE PO  Take by mouth. 10gm/15 ml - 30 mls  three times a day   liraglutide (VICTOZA) 18 MG/3ML SOPN Inject 1.8 mg into the skin daily.   lisinopril (ZESTRIL) 5 MG tablet TAKE 1 TABLET ONCE DAILY.   metFORMIN (GLUCOPHAGE) 1000 MG tablet Take 1 tablet (1,000 mg total) by mouth 2 (two) times daily with a meal.   methadone (DOLOPHINE) 10 MG/5ML solution Take 60 mg by mouth every morning. 47m   Multiple Vitamin (MULTIVITAMIN) tablet Take 1 tablet by mouth daily.   nystatin (MYCOSTATIN/NYSTOP) powder Apply 1 application topically 3 (three) times daily.   omeprazole (PRILOSEC) 40 MG capsule Take 1 capsule (40 mg total) by mouth daily.   ondansetron (ZOFRAN-ODT) 4 MG disintegrating tablet Take 4 mg by mouth every 8 (eight) hours as needed for nausea.    oxyCODONE (ROXICODONE) 5 MG immediate release tablet Take 1 tablet (5 mg total) by mouth every 12 (twelve) hours as needed for breakthrough pain or severe pain.   potassium chloride (KLOR-CON) 10 MEQ tablet Take 20 mEq by mouth daily.    pregabalin (LYRICA) 75 MG capsule Take 75 mg by mouth in the morning, at noon, in the evening, and at bedtime.    PROAIR HFA 108 (90 Base) MCG/ACT inhaler SMARTSIG:2 Puff(s) By Mouth Every 6 Hours PRN   propranolol (INDERAL) 10 MG tablet Take 10 mg by mouth 3 (three) times daily.   RESTASIS 0.05 % ophthalmic emulsion Place 1 drop into both eyes as needed (dry eye).    rosuvastatin (CRESTOR) 5 MG tablet Take 1 tablet (5 mg total) by mouth daily.   spironolactone (ALDACTONE) 100 MG tablet Take 100 mg by mouth daily.    sucralfate (CARAFATE) 1 g tablet Take 1 g by mouth 4 (four) times daily.   triamcinolone (KENALOG) 0.1 % Apply topically daily.   venlafaxine XR (EFFEXOR-XR) 150 MG 24 hr capsule Take total of 225 mg daily (150 mg + 75 mg )   venlafaxine XR (EFFEXOR-XR) 75 MG 24 hr capsule To take along with 1534mfor a total of 225 mg daily   [DISCONTINUED] insulin glargine, 2 Unit Dial, (TOUJEO MAX SOLOSTAR) 300 UNIT/ML Solostar  Pen Inject 100 Units into the skin at bedtime.   [DISCONTINUED] Insulin Lispro-aabc (LYUMJEV KWIKPEN) 200 UNIT/ML KwikPen Inject 24-30 Units into the skin 3 (three) times daily with meals.  clonazePAM (KLONOPIN) 0.5 MG tablet Take 1 tablet (0.5 mg total) by mouth 2 (two) times daily as needed for anxiety.   insulin glargine, 2 Unit Dial, (TOUJEO MAX SOLOSTAR) 300 UNIT/ML Solostar Pen Inject 120 Units into the skin at bedtime.   Insulin Lispro-aabc (LYUMJEV KWIKPEN) 200 UNIT/ML KwikPen Inject 28-34 Units into the skin 3 (three) times daily with meals.   No facility-administered encounter medications on file as of 08/13/2021.    ALLERGIES: Allergies  Allergen Reactions   Iodine-131 Anaphylaxis, Shortness Of Breath and Swelling   Ivp Dye [Iodinated Diagnostic Agents] Anaphylaxis    Skin gets very red, unable to walk    Ketorolac Tromethamine Shortness Of Breath   Tylenol [Acetaminophen] Other (See Comments)    Due to cirrhosis    Gabapentin    Morphine And Related     Pt says she is not allergic to Morphine 08/12/20   Nsaids Other (See Comments)    Flares ulcers   Suboxone [Buprenorphine Hcl-Naloxone Hcl]    Vancomycin Swelling    Facial swelling,     VACCINATION STATUS: Immunization History  Administered Date(s) Administered   Influenza,inj,Quad PF,6+ Mos 07/18/2016   Moderna Sars-Covid-2 Vaccination 03/07/2020, 04/04/2020   Pneumococcal Polysaccharide-23 07/18/2016, 08/01/2020    Diabetes She presents for her follow-up diabetic visit. She has type 2 diabetes mellitus. Onset time: She was diagnosed at approximate age of 63 years. Her disease course has been improving. Hypoglycemia symptoms include sweats and tremors. Pertinent negatives for hypoglycemia include no headaches, nervousness/anxiousness or pallor. (Had symptoms of hypoglycemia when glucose dropped to 117.) Associated symptoms include blurred vision, fatigue, foot paresthesias, polydipsia and polyuria. Pertinent  negatives for diabetes include no chest pain, no polyphagia and no weight loss. There are no hypoglycemic complications. (History of unresponsiveness requiring EMS and hospitalization- none recent) Symptoms are stable. Diabetic complications include heart disease and peripheral neuropathy. Risk factors for coronary artery disease include diabetes mellitus, obesity, sedentary lifestyle, dyslipidemia and hypertension. Current diabetic treatment includes insulin injections and oral agent (monotherapy). She is compliant with treatment most of the time. Her weight is fluctuating minimally. She is following a low salt and generally unhealthy diet. When asked about meal planning, she reported none. She has had a previous visit with a dietitian. She participates in exercise intermittently. Her home blood glucose trend is decreasing steadily. Her breakfast blood glucose range is generally >200 mg/dl. Her lunch blood glucose range is generally >200 mg/dl. Her dinner blood glucose range is generally >200 mg/dl. Her bedtime blood glucose range is generally >200 mg/dl. Her overall blood glucose range is >200 mg/dl. (She presents today, accompanied by her mother, with her CGM, no logs showing continued improvement in her glycemic profile overall, yet still above target.  Analysis of her CGM shows TIR 6%, TAR 94% (80% very high range), TBR 0%.  She still consumes sweet tea but endorses drinking less and less.  She also went to the ED since last visit for recurring complaint of abdominal pain and blood in stool.  She will proceed with getting the colonoscopy when she returns from her trip.  She goes to [employer] in a few days.  She did report vague symptoms of hypoglycemia when her glucose dropped to 117.) An ACE inhibitor/angiotensin II receptor blocker is being taken. She does not see a podiatrist.Eye exam is not current.  Hypertension This is a chronic problem. The current episode started more than 1 month ago. The problem is  unchanged. The problem is controlled. Associated symptoms  include blurred vision, peripheral edema and sweats. Pertinent negatives include no chest pain, headaches, palpitations or shortness of breath. There are no associated agents to hypertension. Risk factors for coronary artery disease include diabetes mellitus, dyslipidemia, obesity and sedentary lifestyle. Past treatments include diuretics and beta blockers. The current treatment provides no improvement. There are no compliance problems.     Review of systems  Constitutional: + Minimally fluctuating body weight,  current Body mass index is 44.89 kg/m. , + fatigue, no subjective hyperthermia, no subjective hypothermia Eyes: no blurry vision, no xerophthalmia ENT: no sore throat, no nodules palpated in throat, no dysphagia/odynophagia, no hoarseness Cardiovascular: no chest pain, no shortness of breath, no palpitations, + leg swelling Respiratory: no cough, no shortness of breath Gastrointestinal: no nausea/vomiting/diarrhea Genitourinary: + polyuria Musculoskeletal: no muscle/joint aches Skin: no rashes, no hyperemia Neurological: no tremors, no numbness, no tingling, no dizziness Psychiatric: no depression, no anxiety  Objective:     BP 114/69   Pulse 87   Ht 5' 9"  (1.753 m)   Wt (!) 304 lb (137.9 kg)   BMI 44.89 kg/m   Wt Readings from Last 3 Encounters:  08/13/21 (!) 304 lb (137.9 kg)  08/11/21 293 lb (132.9 kg)  08/07/21 (!) 305 lb 3.2 oz (138.4 kg)    BP Readings from Last 3 Encounters:  08/13/21 114/69  08/11/21 138/80  08/07/21 127/75     Physical Exam- Limited  Constitutional:  Body mass index is 44.89 kg/m. , not in acute distress, normal state of mind, Eyes:  EOMI, no exophthalmos Neck: Supple Cardiovascular: RRR, no murmurs, rubs, or gallops, 1+ pitting edema to BLE Respiratory: Adequate breathing efforts, no crackles, rales, rhonchi, or wheezing Musculoskeletal: no gross deformities, strength intact in  all four extremities, no gross restriction of joint movements Skin:  no rashes, no hyperemia Neurological: no tremor with outstretched hands   Diabetic Foot Exam - Simple   Simple Foot Form Diabetic Foot exam was performed with the following findings: Yes 08/13/2021 11:14 AM  Visual Inspection No deformities, no ulcerations, no other skin breakdown bilaterally: Yes Sensation Testing Intact to touch and monofilament testing bilaterally: Yes Pulse Check Posterior Tibialis and Dorsalis pulse intact bilaterally: Yes Comments    CMP ( most recent) CMP     Component Value Date/Time   NA 134 (L) 08/11/2021 0531   NA 135 02/19/2021 0822   K 3.7 08/11/2021 0531   CL 100 08/11/2021 0531   CO2 25 08/11/2021 0531   GLUCOSE 252 (H) 08/11/2021 0531   BUN 7 08/11/2021 0531   BUN 6 02/19/2021 0822   CREATININE 0.53 08/11/2021 0531   CALCIUM 8.4 (L) 08/11/2021 0531   PROT 6.9 08/11/2021 0531   PROT 7.0 02/19/2021 0822   ALBUMIN 2.9 (L) 08/11/2021 0531   ALBUMIN 3.6 (L) 02/19/2021 0822   AST 56 (H) 08/11/2021 0531   ALT 38 08/11/2021 0531   ALKPHOS 152 (H) 08/11/2021 0531   BILITOT 1.0 08/11/2021 0531   BILITOT 0.5 02/19/2021 0822   GFRNONAA >60 08/11/2021 0531   GFRAA >60 08/01/2020 0704     Diabetic Labs (most recent): Lab Results  Component Value Date   HGBA1C 11.4 (H) 07/29/2021   HGBA1C 10.3 (A) 05/28/2021   HGBA1C 8.9 (A) 02/26/2021     Lipid Panel ( most recent) Lipid Panel     Component Value Date/Time   CHOL 210 (H) 07/23/2020 0842   TRIG 86 08/12/2020 1023   HDL 60 07/23/2020 0842   CHOLHDL 3.5  07/23/2020 0842   LDLCALC 116 (H) 07/23/2020 0842   LABVLDL 34 07/23/2020 0842      Lab Results  Component Value Date   TSH 0.635 09/08/2020   TSH 0.993 07/30/2020   TSH 1.990 07/23/2020   TSH 1.690 08/22/2010   FREET4 0.72 (L) 07/23/2020   FREET4 1.11 08/22/2010      Assessment & Plan:   1) Type 2 diabetes mellitus with hyperglycemia, without long-term  current use of insulin (HCC)  - Jaime [name] has currently uncontrolled symptomatic type 2 DM since  35 years of age.  She presents today, accompanied by her mother, with her CGM, no logs showing continued improvement in her glycemic profile overall, yet still above target.  Analysis of her CGM shows TIR 6%, TAR 94% (80% very high range), TBR 0%.  She still consumes sweet tea but endorses drinking less and less.  She also went to the ED since last visit for recurring complaint of abdominal pain and blood in stool.  She will proceed with getting the colonoscopy when she returns from her trip.  She goes to [employer] in a few days.  She did report vague symptoms of hypoglycemia when her glucose dropped to 117.  - I had a long discussion with her about the progressive nature of diabetes and the pathology behind its complications.  -her diabetes is complicated by obesity/sedentary life and she remains at a high risk for more acute and chronic complications which include CAD, CVA, CKD, retinopathy, and neuropathy. These are all discussed in detail with her.  - Nutritional counseling repeated at each appointment due to patients tendency to fall back in to old habits.  - The patient admits there is a room for improvement in their diet and drink choices. -  Suggestion is made for the patient to avoid simple carbohydrates from their diet including Cakes, Sweet Desserts / Pastries, Ice Cream, Soda (diet and regular), Sweet Tea, Candies, Chips, Cookies, Sweet Pastries, Store Bought Juices, Alcohol in Excess of 1-2 drinks a day, Artificial Sweeteners, Coffee Creamer, and "Sugar-free" Products. This will help patient to have stable blood glucose profile and potentially avoid unintended weight gain.   - I encouraged the patient to switch to unprocessed or minimally processed complex starch and increased protein intake (animal or plant source), fruits, and vegetables.   - Patient is advised to stick to a routine  mealtimes to eat 3 meals a day and avoid unnecessary snacks (to snack only to correct hypoglycemia).  - I have approached her with the following individualized plan to manage  her diabetes and patient agrees:   -Since her recent hospitalization, her mother has assumed responsibility for her diabetes management.  Number 1 priority will be to avoid hypoglycemia for her given her comorbidities.    -She will tolerate further increase in her Toujeo to 120 units SQ nightly and increase in her Lyumjev to 28-34 units TID with meals if glucose is above 90 and she is eating (Specific instructions on how to titrate insulin dosage based on glucose readings given to patient in writing).  She can continue Victoza 1.8 mg SQ daily, Metformin 1000 mg po twice daily with meals, and Glipizide 5 mg XL daily with breakfast.  We discussed in detail, again, the need for her to comply with dietary restrictions.  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  She could benefit from seeking out a counselor  for positive coping mechanisms and is advised to discuss with her PCP.   -She was provided with letter of medical necessity to take her diabetes supplies into [facility] with her today.  -She is encouraged to continue to monitor glucose 4 times daily (using her CGM), before meals and before bed and log on the clinic sheets provided.  They are instructed to call the clinic if she has readings less than 70 or greater than 300 for 3 tests in a row.     - Specific targets for  A1c;  LDL, HDL,  and Triglycerides were discussed with the patient.  2) Blood Pressure /Hypertension: Her blood pressure is controlled to target.  She is advised to continue Lasix 80 mg po daily, continue Lisinopril 5 mg po daily, continue Propanolol 10 mg po TID, and Aldactone 100 mg o daily  3) Lipids/Hyperlipidemia:   Review of her recent lipid profile from 07/23/20 shows  uncontrolled LDL at 116 and elevated triglycerides of 197.  She is advised to continue Crestor 5 mg po daily at bedtime.  Side effects and precautions discussed with her.  Will recheck lipid panel on subsequent visits.  4)  Weight/Diet:  Her Body mass index is 44.89 kg/m.  -   clearly complicating her diabetes care.  She has recently lost 8 lbs. she is  a candidate for modest weight loss. I discussed with her the fact that loss of 5 - 10% of her  current body weight will have the most impact on her diabetes management.  Exercise, and detailed carbohydrates information provided  -  detailed on discharge instructions.  5) Chronic Care/Health Maintenance: -she is on ACE and statin medications and is encouraged to initiate and continue to follow up with Ophthalmology, Dentist,  Podiatrist at least yearly or according to recommendations, and advised to stay away from smoking. I have recommended yearly flu vaccine and pneumonia vaccine at least every 5 years; moderate intensity exercise for up to 150 minutes weekly; and  sleep for at least 7 hours a day.  - she is advised to maintain close follow up with Hasanaj, Samul Dada, MD for primary care needs, as well as her other providers for optimal and coordinated care.        I spent 38 minutes in the care of the patient today including review of labs from [facility], Lipids, Thyroid Function, Hematology (current and previous including abstractions from other facilities); face-to-face time discussing  her blood glucose readings/logs, discussing hypoglycemia and hyperglycemia episodes and symptoms, medications doses, her options of short and long term treatment based on the latest standards of care / guidelines;  discussion about incorporating lifestyle medicine;  and documenting the encounter.    Please refer to Patient Instructions for Blood Glucose Monitoring and Insulin/Medications Dosing Guide"  in media tab for additional information. Please  also refer to "  Patient Self Inventory" in the Media  tab for reviewed elements of pertinent patient history.  Remus Blake participated in the discussions, expressed understanding, and voiced agreement with the above plans.  All questions were answered to her satisfaction. she is encouraged to contact clinic should she have any questions or concerns prior to her return visit.    Follow up plan: - Return in about 1 month (around 09/13/2021) for Diabetes F/U, Bring meter and logs.    Rayetta Pigg, [facility] [address] [place],  [address] Phone: [phone] Fax: [phone]   08/13/2021, 11:15 AM

## 2021-08-22 ENCOUNTER — Telehealth: Payer: Self-pay | Admitting: Nurse Practitioner

## 2021-08-22 ENCOUNTER — Encounter: Payer: Self-pay | Admitting: Nurse Practitioner

## 2021-08-22 NOTE — Telephone Encounter (Signed)
error 

## 2021-08-23 ENCOUNTER — Other Ambulatory Visit: Payer: Self-pay | Admitting: Nurse Practitioner

## 2021-08-25 ENCOUNTER — Telehealth: Payer: Self-pay | Admitting: Nurse Practitioner

## 2021-08-25 ENCOUNTER — Other Ambulatory Visit: Payer: Self-pay

## 2021-08-25 ENCOUNTER — Other Ambulatory Visit: Payer: Self-pay | Admitting: Nurse Practitioner

## 2021-08-25 MED ORDER — ROSUVASTATIN CALCIUM 5 MG PO TABS: 5.0000 mg | ORAL_TABLET | Freq: Every day | ORAL | 3 refills | Status: DC

## 2021-08-25 NOTE — Telephone Encounter (Signed)
Medication has been sent to the [facility] for patient.

## 2021-08-25 NOTE — Telephone Encounter (Signed)
Pt needs medication sent to [facility] in stead of [facility]   rosuvastatin (CRESTOR) 5 MG tablet

## 2021-08-26 ENCOUNTER — Other Ambulatory Visit: Payer: Self-pay

## 2021-08-26 ENCOUNTER — Telehealth: Payer: Self-pay | Admitting: Nurse Practitioner

## 2021-08-26 MED ORDER — HUMALOG KWIKPEN 200 UNIT/ML ~~LOC~~ SOPN: 28.0000 [IU] | PEN_INJECTOR | Freq: Three times a day (TID) | SUBCUTANEOUS | 3 refills | Status: DC

## 2021-08-26 NOTE — Telephone Encounter (Signed)
Pt mom called and states the metformin is needing a prior authorization and they are needing these 2 insulin called in.  insulin glargine, 2 Unit Dial, (TOUJEO MAX SOLOSTAR) 300 UNIT/ML Solostar Pen  Insulin Lispro-aabc (LYUMJEV KWIKPEN) 200 UNIT/ML [place], [place] - [place] Phone:  [phone]  Fax:  [phone]

## 2021-08-26 NOTE — Telephone Encounter (Signed)
Patient has already been approved for Toujeo.  I called [facility] and was told that the refill for Toujeo is too early and the patient just picked up her Metformin and it does not need a prior authorization. I have sent in Humalog instead of the Lyumjev to [facility] to see if this will be covered by her insurance. Pharmacy verbalized an understanding and will let us know if not approved.

## 2021-08-27 ENCOUNTER — Other Ambulatory Visit: Payer: Self-pay

## 2021-08-27 DIAGNOSIS — E1165 Type 2 diabetes mellitus with hyperglycemia: Secondary | ICD-10-CM

## 2021-08-27 MED ORDER — INSULIN ASPART 100 UNIT/ML FLEXPEN: 28.0000 [IU] | PEN_INJECTOR | Freq: Three times a day (TID) | SUBCUTANEOUS | 0 refills | Status: DC

## 2021-09-07 ENCOUNTER — Emergency Department (HOSPITAL_COMMUNITY)
Admission: EM | Admit: 2021-09-07 | Discharge: 2021-09-07 | Disposition: A | Discharge status: Home / Self Care | Payer: Medicaid Other | Attending: Emergency Medicine | Admitting: Emergency Medicine

## 2021-09-07 ENCOUNTER — Other Ambulatory Visit: Payer: Self-pay

## 2021-09-07 ENCOUNTER — Encounter (HOSPITAL_COMMUNITY): Payer: Self-pay

## 2021-09-07 DIAGNOSIS — J449 Chronic obstructive pulmonary disease, unspecified: Secondary | ICD-10-CM | POA: Insufficient documentation

## 2021-09-07 DIAGNOSIS — F17210 Nicotine dependence, cigarettes, uncomplicated: Secondary | ICD-10-CM | POA: Insufficient documentation

## 2021-09-07 DIAGNOSIS — J45909 Unspecified asthma, uncomplicated: Secondary | ICD-10-CM | POA: Insufficient documentation

## 2021-09-07 DIAGNOSIS — E119 Type 2 diabetes mellitus without complications: Secondary | ICD-10-CM | POA: Insufficient documentation

## 2021-09-07 DIAGNOSIS — Z7951 Long term (current) use of inhaled steroids: Secondary | ICD-10-CM | POA: Insufficient documentation

## 2021-09-07 DIAGNOSIS — Z79899 Other long term (current) drug therapy: Secondary | ICD-10-CM | POA: Insufficient documentation

## 2021-09-07 DIAGNOSIS — Z794 Long term (current) use of insulin: Secondary | ICD-10-CM | POA: Diagnosis not present

## 2021-09-07 DIAGNOSIS — R1010 Upper abdominal pain, unspecified: Secondary | ICD-10-CM | POA: Insufficient documentation

## 2021-09-07 DIAGNOSIS — R109 Unspecified abdominal pain: Secondary | ICD-10-CM

## 2021-09-07 LAB — CBC WITH DIFFERENTIAL/PLATELET
Abs Immature Granulocytes: 0.02 10*3/uL (ref 0.00–0.07)
Basophils Absolute: 0 10*3/uL (ref 0.0–0.1)
Basophils Relative: 1 %
Eosinophils Absolute: 0.2 10*3/uL (ref 0.0–0.5)
Eosinophils Relative: 6 %
HCT: 39.6 % (ref 36.0–46.0)
Hemoglobin: 13.6 g/dL (ref 12.0–15.0)
Immature Granulocytes: 1 %
Lymphocytes Relative: 20 %
Lymphs Abs: 0.8 10*3/uL (ref 0.7–4.0)
MCH: 32.2 pg (ref 26.0–34.0)
MCHC: 34.3 g/dL (ref 30.0–36.0)
MCV: 93.8 fL (ref 80.0–100.0)
Monocytes Absolute: 0.3 10*3/uL (ref 0.1–1.0)
Monocytes Relative: 7 %
Neutro Abs: 2.6 10*3/uL (ref 1.7–7.7)
Neutrophils Relative %: 65 %
Platelets: 59 10*3/uL — ABNORMAL LOW (ref 150–400)
RBC: 4.22 MIL/uL (ref 3.87–5.11)
RDW: 14 % (ref 11.5–15.5)
WBC: 3.9 10*3/uL — ABNORMAL LOW (ref 4.0–10.5)
nRBC: 0 % (ref 0.0–0.2)

## 2021-09-07 LAB — COMPREHENSIVE METABOLIC PANEL
ALT: 34 U/L (ref 0–44)
AST: 29 U/L (ref 15–41)
Albumin: 3.2 g/dL — ABNORMAL LOW (ref 3.5–5.0)
Alkaline Phosphatase: 115 U/L (ref 38–126)
Anion gap: 8 (ref 5–15)
BUN: 9 mg/dL (ref 6–20)
CO2: 26 mmol/L (ref 22–32)
Calcium: 8.6 mg/dL — ABNORMAL LOW (ref 8.9–10.3)
Chloride: 99 mmol/L (ref 98–111)
Creatinine, Ser: 0.58 mg/dL (ref 0.44–1.00)
GFR, Estimated: >60 mL/min (ref >60)
Glucose, Bld: 408 mg/dL — ABNORMAL HIGH (ref 70–99)
Potassium: 3.8 mmol/L (ref 3.5–5.1)
Sodium: 133 mmol/L — ABNORMAL LOW (ref 135–145)
Total Bilirubin: 1.2 mg/dL (ref 0.3–1.2)
Total Protein: 7 g/dL (ref 6.5–8.1)

## 2021-09-07 LAB — CBG MONITORING, ED
Glucose-Capillary: 400 mg/dL — ABNORMAL HIGH (ref 70–99)
Glucose-Capillary: 333 mg/dL — ABNORMAL HIGH (ref 70–99)

## 2021-09-07 LAB — URINALYSIS, ROUTINE W REFLEX MICROSCOPIC
Bilirubin Urine: NEGATIVE
Glucose, UA: >=500 mg/dL — AB
Hgb urine dipstick: NEGATIVE
Ketones, ur: NEGATIVE mg/dL
Leukocytes,Ua: NEGATIVE
Nitrite: NEGATIVE
Protein, ur: NEGATIVE mg/dL
Specific Gravity, Urine: 1.036 — ABNORMAL HIGH (ref 1.005–1.030)
pH: 7 (ref 5.0–8.0)

## 2021-09-07 LAB — PREGNANCY, URINE: Preg Test, Ur: NEGATIVE

## 2021-09-07 LAB — LIPASE, BLOOD: Lipase: 25 U/L (ref 11–51)

## 2021-09-07 MED ORDER — ONDANSETRON HCL 4 MG/2ML IJ SOLN
4.0000 mg | Freq: Once | INTRAMUSCULAR | Status: AC
  Administered 2021-09-07: 4 mg via INTRAVENOUS
  Filled 2021-09-07: qty 2

## 2021-09-07 MED ORDER — PANTOPRAZOLE SODIUM 40 MG IV SOLR
40.0000 mg | Freq: Once | INTRAVENOUS | Status: AC
  Administered 2021-09-07: 40 mg via INTRAVENOUS
  Filled 2021-09-07: qty 40

## 2021-09-07 MED ORDER — INSULIN ASPART 100 UNIT/ML IJ SOLN
15.0000 [IU] | Freq: Once | INTRAMUSCULAR | Status: DC
  Filled 2021-09-07: qty 1

## 2021-09-07 MED ORDER — SODIUM CHLORIDE 0.9 % IV BOLUS
1000.0000 mL | Freq: Once | INTRAVENOUS | Status: AC
  Administered 2021-09-07: 1000 mL via INTRAVENOUS

## 2021-09-07 MED ORDER — LIDOCAINE VISCOUS HCL 2 % MT SOLN
15.0000 mL | Freq: Once | OROMUCOSAL | Status: AC
  Administered 2021-09-07: 15 mL via ORAL
  Filled 2021-09-07: qty 15

## 2021-09-07 MED ORDER — ALUM & MAG HYDROXIDE-SIMETH 200-200-20 MG/5ML PO SUSP
30.0000 mL | Freq: Once | ORAL | Status: AC
  Administered 2021-09-07: 30 mL via ORAL
  Filled 2021-09-07: qty 30

## 2021-09-07 NOTE — ED Triage Notes (Signed)
Pt c/o of abdominal pain with n&v that started about an hour ago.

## 2021-09-07 NOTE — ED Provider Notes (Signed)
[facility] EMERGENCY DEPARTMENT Provider Note   CSN: [id number] Arrival date & time: 09/07/21  2353     History Chief Complaint  Patient presents with   Abdominal Pain    Jaime [name] is a 35 y.o. female.  Patient with a history of chronic abdominal pain, cirrhosis, hepatitis C, diabetes, ulcer disease presenting with upper abdominal pain that woke her from sleep.  This is a recurrent problem for her over the past 1 month that she has multiple evaluations for the same problem.  She states the pain returned again this morning is the same as previous episodes.  She states the pain is "really bad today".  Associated with several episodes of nausea and vomiting this morning.  Denies diarrhea.  Denies fever.  Denies pain with urination or blood in the urine.  Patient has had multiple previous evaluations for this pain without clear etiology.  States she is supposed to have an EGD and colonoscopy by her GI doctor but this is "on hold until my sugars are better". She is had a CT scan as well as an ultrasound. Pain feels the same as previous episodes.  Notably the patient is here with her grandfather who is also a patient.  He states he was going to get seen himself when the patient decided she needed to be evaluated as well.  The history is provided by the patient.  Abdominal Pain Associated symptoms: nausea and vomiting   Associated symptoms: no cough, no diarrhea, no dysuria, no fatigue, no fever, no hematuria and no shortness of breath       Past Medical History:  Diagnosis Date   Anxiety    Asthma    Chronic abdominal pain    Chronic back pain    Cirrhosis ([place])    Cirrhosis ([place])    COPD (chronic obstructive pulmonary disease) ([place])    Depression    Diabetes mellitus without complication ([place])    Diabetes mellitus, type II ([place])    Hepatitis C    Insomnia    Long-term current use of methadone for opiate dependence ([place])    Lupus ([place])    Migraine headache    Neuropathy     Nocturnal seizures ([place])    Peptic ulcer    Spleen enlarged     Patient Active Problem List   Diagnosis Date Noted   Rectal bleeding 07/28/2021   Esophageal dysphagia 07/28/2021   Melena 07/28/2021   Pain of upper abdomen 07/28/2021   Non-intractable vomiting 07/28/2021   Adjustment disorder with depressed mood 09/07/2020   Respiratory failure ([place]) 08/31/2020   Acute encephalopathy 08/31/2020   Hyponatremia 08/16/2020   Volume overload 08/14/2020   Dyspnea 07/29/2020   Nexplanon insertion 05/20/2020   Nexplanon removal 05/20/2020   Pressure injury of skin 01/29/2019   Uncontrolled diabetes mellitus 09/19/2018   Hyperglycemia 09/17/2018   Acute respiratory failure with hypoxia ([place]) 09/15/2018   Anxiety 09/15/2018   Chronic abdominal pain 09/15/2018   Diabetes mellitus without complication ([place]) 61/44/3154   Hepatitis C 09/15/2018   Peptic ulcer 09/15/2018   Long-term current use of methadone for opiate dependence ([place]) 09/15/2018   Cirrhosis ([place]) 09/15/2018   Acute hypoxemic respiratory failure ([place]) 07/17/2016   Pleural effusion on right 07/17/2016   Multiple rib fractures involving four or more ribs 07/17/2016   Hemothorax on right 07/17/2016   Chronic pain disorder 07/17/2016    Past Surgical History:  Procedure Laterality Date   CHOLECYSTECTOMY     ESOPHAGOGASTRODUODENOSCOPY  06/2020  done at [facility], candida in upper esophagus (treated with diflucan), ulcerative esophagitis at GE junction, gastritis in stomach, single ulcer in duodenal bulb, with duodenal mucosa showing no abnormality. No presence of varices     OB History     Gravida  0   Para  0   Term  0   Preterm  0   AB  0   Living  0      SAB  0   IAB  0   Ectopic  0   Multiple  0   Live Births  0           Family History  Problem Relation Age of Onset   Hypertension Mother    Hyperlipidemia Mother    Hyperlipidemia Maternal Grandfather     Social History   Tobacco  Use   Smoking status: Every Day    Types: E-cigarettes   Smokeless tobacco: Never  Vaping Use   Vaping Use: Every day   Substances: Nicotine, Flavoring  Substance Use Topics   Alcohol use: No   Drug use: No    Home Medications Prior to Admission medications   Medication Sig Start Date End Date Taking? Authorizing Provider  Accu-Chek Softclix Lancets lancets 4 (four) times daily. 07/07/20   [provider]  albuterol (ACCUNEB) 0.63 MG/3ML nebulizer solution Take by nebulization 4 (four) times daily. 07/27/20   [provider]  albuterol (PROVENTIL) (2.5 MG/3ML) 0.083% nebulizer solution Take 2.5 mg by nebulization every 6 (six) hours as needed for wheezing or shortness of breath.    [provider]  ARIPiprazole (ABILIFY) 5 MG tablet Take 5 mg by mouth daily. 06/20/21   [provider]  aspirin EC 81 MG tablet Take 162 mg by mouth daily. Swallow whole.    [provider]  cetirizine (ZYRTEC ALLERGY) 10 MG tablet Take 1 tablet (10 mg total) by mouth daily. 07/09/20   Avegno, Darrelyn Hillock, FNP  clonazePAM (KLONOPIN) 0.5 MG tablet Take 1 tablet (0.5 mg total) by mouth 2 (two) times daily as needed for anxiety. 09/09/20 07/28/21  Shelly Coss, MD  Continuous Blood Gluc Sensor (DEXCOM G6 SENSOR) MISC 4 Pieces by Does not apply route once a week. 08/07/21   Brita Romp, NP  Continuous Blood Gluc Transmit (DEXCOM G6 TRANSMITTER) MISC 1 Piece by Does not apply route as directed. 02/05/21   Brita Romp, NP  cromolyn (OPTICROM) 4 % ophthalmic solution 1 drop 2 (two) times daily. 01/28/21   [provider]  etonogestrel (NEXPLANON) 68 MG IMPL implant 1 each by Subdermal route once.    [provider]  furosemide (LASIX) 40 MG tablet Take 80 mg by mouth daily.  05/22/20   [provider]  glipiZIDE (GLUCOTROL XL) 5 MG 24 hr tablet Take 1 tablet (5 mg total) by mouth daily with breakfast. 07/30/21   Brita Romp, NP  GLOBAL  EASE INJECT PEN NEEDLES 31G X 8 MM MISC USE 1 PEN NEEDLE THREE TIMES DAILY. 06/30/21   Brita Romp, NP  glucose blood (ACCU-CHEK GUIDE) test strip Use as instructed to check blood glucose four times daily 05/20/21   Brita Romp, NP  hyoscyamine (ANASPAZ) 0.125 MG TBDP disintergrating tablet Place 1 tablet (0.125 mg total) under the tongue 3 (three) times daily as needed for bladder spasms or cramping (Patient Preference). 07/28/21   Carlan, Chelsea L, NP  insulin aspart (NOVOLOG) 100 UNIT/ML FlexPen Inject 28-34 Units into the skin 3 (three)  times daily with meals. 08/27/21   Brita Romp, NP  insulin glargine, 2 Unit Dial, (TOUJEO MAX SOLOSTAR) 300 UNIT/ML Solostar Pen Inject 120 Units into the skin at bedtime. 08/13/21   Brita Romp, NP  Insulin Pen Needle (PEN NEEDLES) 32G X 6 MM MISC 1 each by Does not apply route 3 (three) times daily. 02/26/21   Brita Romp, NP  LACTULOSE PO Take by mouth. 10gm/15 ml - 30 mls  three times a day    [provider]  liraglutide (VICTOZA) 18 MG/3ML SOPN Inject 1.8 mg into the skin daily. 07/30/21   Brita Romp, NP  lisinopril (ZESTRIL) 5 MG tablet TAKE 1 TABLET ONCE DAILY. 08/11/21   Brita Romp, NP  metFORMIN (GLUCOPHAGE) 1000 MG tablet Take 1 tablet (1,000 mg total) by mouth 2 (two) times daily with a meal. 07/30/21   Brita Romp, NP  methadone (DOLOPHINE) 10 MG/5ML solution Take 60 mg by mouth every morning. 25m    [provider]  Multiple Vitamin (MULTIVITAMIN) tablet Take 1 tablet by mouth daily.    [provider]  nystatin (MYCOSTATIN/NYSTOP) powder Apply 1 application topically 3 (three) times daily. 05/19/21   Mesner, JCorene Cornea MD  omeprazole (PRILOSEC) 40 MG capsule Take 1 capsule (40 mg total) by mouth daily. 07/28/21   Carlan, Chelsea L, NP  ondansetron (ZOFRAN-ODT) 4 MG disintegrating tablet Take 4 mg by mouth every 8 (eight) hours as needed for nausea.  06/14/20   [provider]  oxyCODONE (ROXICODONE) 5 MG immediate release tablet Take 1 tablet (5 mg total) by mouth every 12 (twelve) hours as needed for breakthrough pain or severe pain. 05/19/21   Mesner, JCorene Cornea MD  potassium chloride (KLOR-CON) 10 MEQ tablet Take 20 mEq by mouth daily.  04/24/20   [provider]  pregabalin (LYRICA) 75 MG capsule Take 75 mg by mouth in the morning, at noon, in the evening, and at bedtime.     [provider]  PROAIR HFA 108 ([phone]) MCG/ACT inhaler SMARTSIG:2 Puff(s) By Mouth Every 6 Hours PRN 04/04/21   [provider]  propranolol (INDERAL) 10 MG tablet Take 10 mg by mouth 3 (three) times daily. 08/08/20   [provider]  RESTASIS 0.05 % ophthalmic emulsion Place 1 drop into both eyes as needed (dry eye).  04/22/19   [provider]  rosuvastatin (CRESTOR) 5 MG tablet Take 1 tablet (5 mg total) by mouth daily. 08/25/21   RBrita Romp NP  spironolactone (ALDACTONE) 100 MG tablet Take 100 mg by mouth daily.  05/22/20   [provider]  sucralfate (CARAFATE) 1 g tablet Take 1 g by mouth 4 (four) times daily. 07/02/20   [provider]  triamcinolone (KENALOG) 0.1 % Apply topically daily. 02/04/21   [provider]  venlafaxine XR (EFFEXOR-XR) 150 MG 24 hr capsule Take total of 225 mg daily (150 mg + 75 mg ) 01/24/21   HNorman Clay MD  venlafaxine XR (EFFEXOR-XR) 75 MG 24 hr capsule To take along with 1555mfor a total of 225 mg daily 01/20/21   HiNorman ClayMD    Allergies    Iodine-131, Ivp dye [iodinated diagnostic agents], Ketorolac tromethamine, Tylenol [acetaminophen], Gabapentin, Morphine and related, Nsaids, Suboxone [buprenorphine hcl-naloxone hcl], and Vancomycin  Review of Systems   Review of Systems  Constitutional:  Positive for activity change and appetite change. Negative for fatigue and fever.  HENT:  Negative for congestion and rhinorrhea.  Respiratory:  Negative for cough, chest  tightness and shortness of breath.   Gastrointestinal:  Positive for abdominal pain, nausea and vomiting. Negative for diarrhea.  Genitourinary:  Negative for dysuria and hematuria.  Musculoskeletal:  Negative for arthralgias and myalgias.  Skin:  Negative for rash.  Neurological:  Negative for dizziness, weakness and headaches.   all other systems are negative except as noted in the HPI and PMH.   Physical Exam Updated Vital Signs BP 125/87   Pulse 84   Temp 98.1 F (36.7 C) (Oral)   Resp 17   Ht 5' 9"  (1.753 m)   Wt 133.8 kg   LMP  (LMP Unknown)   SpO2 96%   BMI 43.56 kg/m   Physical Exam Vitals and nursing note reviewed.  Constitutional:      General: She is not in acute distress.    Appearance: She is well-developed. She is obese.     Comments: Tearful  HENT:     Head: Normocephalic and atraumatic.     Mouth/Throat:     Pharynx: No oropharyngeal exudate.  Eyes:     Conjunctiva/sclera: Conjunctivae normal.     Pupils: Pupils are equal, round, and reactive to light.  Neck:     Comments: No meningismus. Cardiovascular:     Rate and Rhythm: Normal rate and regular rhythm.     Heart sounds: Normal heart sounds. No murmur heard. Pulmonary:     Effort: Pulmonary effort is normal. No respiratory distress.     Breath sounds: Normal breath sounds.  Abdominal:     Palpations: Abdomen is soft.     Tenderness: There is abdominal tenderness. There is no guarding or rebound.     Comments: Epigastric and right upper quadrant tenderness without guarding or rebound.  No lower abdominal tenderness  Musculoskeletal:        General: No tenderness. Normal range of motion.     Cervical back: Normal range of motion and neck supple.  Skin:    General: Skin is warm.  Neurological:     Mental Status: She is alert and oriented to person, place, and time.     Cranial Nerves: No cranial nerve deficit.     Motor: No abnormal muscle tone.     Coordination: Coordination normal.      Comments:  5/5 strength throughout. CN 2-12 intact.Equal grip strength.   Psychiatric:        Behavior: Behavior normal.    ED Results / Procedures / Treatments   Labs (all labs ordered are listed, but only abnormal results are displayed) Labs Reviewed  URINALYSIS, ROUTINE W REFLEX MICROSCOPIC - Abnormal; Notable for the following components:      Result Value   Specific Gravity, Urine 1.036 (*)    Glucose, UA >=500 (*)    Bacteria, UA MANY (*)    All other components within normal limits  CBC WITH DIFFERENTIAL/PLATELET - Abnormal; Notable for the following components:   WBC 3.9 (*)    Platelets 59 (*)    All other components within normal limits  CBG MONITORING, ED - Abnormal; Notable for the following components:   Glucose-Capillary 400 (*)    All other components within normal limits  PREGNANCY, URINE  COMPREHENSIVE METABOLIC PANEL  LIPASE, BLOOD    EKG None  Radiology No results found.  Procedures Procedures   Medications Ordered in ED Medications  sodium chloride 0.9 % bolus 1,000 mL (has no administration in time range)  ondansetron (ZOFRAN) injection 4 mg (  4 mg Intravenous Given 09/07/21 1886)    ED Course  I have reviewed the triage vital signs and the nursing notes.  Pertinent labs & imaging results that were available during my care of the patient were reviewed by me and considered in my medical decision making (see chart for details).    MDM Rules/Calculators/A&P                          Acute on chronic epigastric pain with nausea and vomiting.  Recurrent visits for same with visits with GI as well as ED.  Has had reassuring CT scan x2 as well as ultrasound.  Hyperglycemic today.  IV fluids initiated.  Labs to be obtained  Recent CT scan and ultrasound reviewed.   Patient given fluids and symptom control.  We will hold off on narcotics given chronic nature of the pain.  Awaiting labs to evaluate for DKA.  LFTs and lipase pending.  Patient be  hydrated and labs will be obtained.  Rule out DKA.  Abdomen soft without peritoneal signs.  She has had 2 recent CT scans as well as ultrasound without acute findings. Will try to avoid imaging today.  Care transferred at shift change to Dr. Roderic Palau.    Discussed avoiding alcohol, caffeine, NSAID medications, spicy foods.  Continue her PPI.   Follow-up with her gastroenterologist as scheduled for EGD and colonoscopy. Return to the ED with significantly changing or worsening pain, intractable vomiting, fever, or any other concerns. Final diagnoses:  None    Rx / DC Orders ED Discharge Orders     None        Amery Minasyan, Annie Main, MD 09/07/21 [phone]

## 2021-09-07 NOTE — ED Notes (Signed)
ED Provider at bedside. 

## 2021-09-07 NOTE — Discharge Instructions (Addendum)
Take your medications as prescribed and follow-up with your gastroenterologist for your endoscopies.  Return to the ED with worsening pain, fever, vomiting, unable to eat or drink or any other concerns.  Increase your Prilosec so you are taking it twice a day

## 2021-09-14 ENCOUNTER — Other Ambulatory Visit: Payer: Self-pay

## 2021-09-14 ENCOUNTER — Emergency Department (HOSPITAL_COMMUNITY)
Admission: EM | Admit: 2021-09-14 | Discharge: 2021-09-14 | Disposition: A | Discharge status: Home / Self Care | Payer: Medicaid Other | Attending: Emergency Medicine | Admitting: Emergency Medicine

## 2021-09-14 ENCOUNTER — Encounter (HOSPITAL_COMMUNITY): Payer: Self-pay | Admitting: Emergency Medicine

## 2021-09-14 DIAGNOSIS — Z7982 Long term (current) use of aspirin: Secondary | ICD-10-CM | POA: Diagnosis not present

## 2021-09-14 DIAGNOSIS — Z794 Long term (current) use of insulin: Secondary | ICD-10-CM | POA: Diagnosis not present

## 2021-09-14 DIAGNOSIS — R1013 Epigastric pain: Secondary | ICD-10-CM | POA: Insufficient documentation

## 2021-09-14 DIAGNOSIS — Z79899 Other long term (current) drug therapy: Secondary | ICD-10-CM | POA: Insufficient documentation

## 2021-09-14 DIAGNOSIS — J45909 Unspecified asthma, uncomplicated: Secondary | ICD-10-CM | POA: Diagnosis not present

## 2021-09-14 DIAGNOSIS — F17290 Nicotine dependence, other tobacco product, uncomplicated: Secondary | ICD-10-CM | POA: Diagnosis not present

## 2021-09-14 DIAGNOSIS — E1140 Type 2 diabetes mellitus with diabetic neuropathy, unspecified: Secondary | ICD-10-CM | POA: Diagnosis not present

## 2021-09-14 DIAGNOSIS — J449 Chronic obstructive pulmonary disease, unspecified: Secondary | ICD-10-CM | POA: Insufficient documentation

## 2021-09-14 DIAGNOSIS — Z7984 Long term (current) use of oral hypoglycemic drugs: Secondary | ICD-10-CM | POA: Diagnosis not present

## 2021-09-14 DIAGNOSIS — R109 Unspecified abdominal pain: Secondary | ICD-10-CM

## 2021-09-14 LAB — CBC WITH DIFFERENTIAL/PLATELET
Abs Immature Granulocytes: 0.01 10*3/uL (ref 0.00–0.07)
Basophils Absolute: 0 10*3/uL (ref 0.0–0.1)
Basophils Relative: 1 %
Eosinophils Absolute: 0.2 10*3/uL (ref 0.0–0.5)
Eosinophils Relative: 6 %
HCT: 41.5 % (ref 36.0–46.0)
Hemoglobin: 14.3 g/dL (ref 12.0–15.0)
Immature Granulocytes: 0 %
Lymphocytes Relative: 25 %
Lymphs Abs: 0.7 10*3/uL (ref 0.7–4.0)
MCH: 31.9 pg (ref 26.0–34.0)
MCHC: 34.5 g/dL (ref 30.0–36.0)
MCV: 92.6 fL (ref 80.0–100.0)
Monocytes Absolute: 0.3 10*3/uL (ref 0.1–1.0)
Monocytes Relative: 10 %
Neutro Abs: 1.7 10*3/uL (ref 1.7–7.7)
Neutrophils Relative %: 58 %
Platelets: 58 10*3/uL — ABNORMAL LOW (ref 150–400)
RBC: 4.48 MIL/uL (ref 3.87–5.11)
RDW: 13.9 % (ref 11.5–15.5)
WBC: 3 10*3/uL — ABNORMAL LOW (ref 4.0–10.5)
nRBC: 0 % (ref 0.0–0.2)

## 2021-09-14 LAB — I-STAT BETA HCG BLOOD, ED (MC, WL, AP ONLY): I-stat hCG, quantitative: <5 m[IU]/mL (ref <5)

## 2021-09-14 LAB — CBG MONITORING, ED: Glucose-Capillary: 351 mg/dL — ABNORMAL HIGH (ref 70–99)

## 2021-09-14 LAB — COMPREHENSIVE METABOLIC PANEL
ALT: 39 U/L (ref 0–44)
AST: 33 U/L (ref 15–41)
Albumin: 3.1 g/dL — ABNORMAL LOW (ref 3.5–5.0)
Alkaline Phosphatase: 147 U/L — ABNORMAL HIGH (ref 38–126)
Anion gap: 9 (ref 5–15)
BUN: 7 mg/dL (ref 6–20)
CO2: 25 mmol/L (ref 22–32)
Calcium: 8.3 mg/dL — ABNORMAL LOW (ref 8.9–10.3)
Chloride: 97 mmol/L — ABNORMAL LOW (ref 98–111)
Creatinine, Ser: 0.56 mg/dL (ref 0.44–1.00)
GFR, Estimated: >60 mL/min (ref >60)
Glucose, Bld: 440 mg/dL — ABNORMAL HIGH (ref 70–99)
Potassium: 3.5 mmol/L (ref 3.5–5.1)
Sodium: 131 mmol/L — ABNORMAL LOW (ref 135–145)
Total Bilirubin: 1.4 mg/dL — ABNORMAL HIGH (ref 0.3–1.2)
Total Protein: 7 g/dL (ref 6.5–8.1)

## 2021-09-14 LAB — LIPASE, BLOOD: Lipase: 20 U/L (ref 11–51)

## 2021-09-14 MED ORDER — MORPHINE SULFATE (PF) 4 MG/ML IV SOLN
4.0000 mg | Freq: Once | INTRAVENOUS | Status: AC
  Administered 2021-09-14: 4 mg via INTRAVENOUS
  Filled 2021-09-14: qty 1

## 2021-09-14 MED ORDER — INSULIN ASPART 100 UNIT/ML IV SOLN
10.0000 [IU] | Freq: Once | INTRAVENOUS | Status: AC
  Administered 2021-09-14: 10 [IU] via INTRAVENOUS

## 2021-09-14 MED ORDER — ONDANSETRON HCL 4 MG/2ML IJ SOLN
4.0000 mg | Freq: Once | INTRAMUSCULAR | Status: AC
  Administered 2021-09-14: 4 mg via INTRAVENOUS
  Filled 2021-09-14: qty 2

## 2021-09-14 MED ORDER — SODIUM CHLORIDE 0.9 % IV BOLUS
1000.0000 mL | Freq: Once | INTRAVENOUS | Status: AC
  Administered 2021-09-14: 1000 mL via INTRAVENOUS

## 2021-09-14 MED ORDER — LIDOCAINE VISCOUS HCL 2 % MT SOLN
15.0000 mL | Freq: Once | OROMUCOSAL | Status: AC
  Administered 2021-09-14: 15 mL via ORAL
  Filled 2021-09-14: qty 15

## 2021-09-14 MED ORDER — ALUM & MAG HYDROXIDE-SIMETH 200-200-20 MG/5ML PO SUSP
30.0000 mL | Freq: Once | ORAL | Status: AC
  Administered 2021-09-14: 30 mL via ORAL
  Filled 2021-09-14: qty 30

## 2021-09-14 NOTE — ED Triage Notes (Signed)
Pt with c/o abdominal pain and nausea that started an hour ago. Pt states she has a colonoscopy scheduled for Friday.

## 2021-09-14 NOTE — ED Notes (Signed)
Pt ambulated to restroom without difficulties.

## 2021-09-14 NOTE — ED Notes (Signed)
Insulin aspart (novoLOG) injection 10 Units not available in pixus. EDP aware. Will call pharmacy when they get here.

## 2021-09-14 NOTE — ED Provider Notes (Signed)
[facility] EMERGENCY DEPARTMENT Provider Note   CSN: [id number] Arrival date & time: 09/14/21  0545     History Chief Complaint  Patient presents with   Abdominal Pain    Kimble Delaurentis is a 35 y.o. female.  Patient is a 36 year old female with extensive past medical history including hepatitis C, cirrhosis, IV drug abuse, obesity, and chronic/recurrent episodes of abdominal pain.  She presents today with complaints of epigastric pain that started approximately 1 hour prior to presentation and woke her from sleep.  She describes severe pain to the epigastric region along with NAUSEA but no vomiting.  Patient has been seen on multiple occasions in the past with similar complaints and is followed by gastroenterology here in [place].  She tells me she has a colonoscopy scheduled for this Friday.  She denies any fevers or chills.  She denies any bloody stool or vomit.  The history is provided by the patient.  Abdominal Pain Pain location:  Epigastric Pain quality: stabbing   Pain radiates to:  Does not radiate Pain severity:  Severe Onset quality:  Sudden Duration:  1 hour Timing:  Constant Progression:  Worsening Chronicity:  Recurrent Relieved by:  Nothing Worsened by:  Nothing     Past Medical History:  Diagnosis Date   Anxiety    Asthma    Chronic abdominal pain    Chronic back pain    Cirrhosis ([place])    Cirrhosis ([place])    COPD (chronic obstructive pulmonary disease) ([place])    Depression    Diabetes mellitus without complication ([place])    Diabetes mellitus, type II ([place])    Hepatitis C    Insomnia    Long-term current use of methadone for opiate dependence ([place])    Lupus ([place])    Migraine headache    Neuropathy    Nocturnal seizures ([place])    Peptic ulcer    Spleen enlarged     Patient Active Problem List   Diagnosis Date Noted   Rectal bleeding 07/28/2021   Esophageal dysphagia 07/28/2021   Melena 07/28/2021   Pain of upper abdomen 07/28/2021    Non-intractable vomiting 07/28/2021   Adjustment disorder with depressed mood 09/07/2020   Respiratory failure ([place]) 08/31/2020   Acute encephalopathy 08/31/2020   Hyponatremia 08/16/2020   Volume overload 08/14/2020   Dyspnea 07/29/2020   Nexplanon insertion 05/20/2020   Nexplanon removal 05/20/2020   Pressure injury of skin 01/29/2019   Uncontrolled diabetes mellitus 09/19/2018   Hyperglycemia 09/17/2018   Acute respiratory failure with hypoxia ([place]) 09/15/2018   Anxiety 09/15/2018   Chronic abdominal pain 09/15/2018   Diabetes mellitus without complication ([place]) 62/37/6283   Hepatitis C 09/15/2018   Peptic ulcer 09/15/2018   Long-term current use of methadone for opiate dependence ([place]) 09/15/2018   Cirrhosis ([place]) 09/15/2018   Acute hypoxemic respiratory failure ([place]) 07/17/2016   Pleural effusion on right 07/17/2016   Multiple rib fractures involving four or more ribs 07/17/2016   Hemothorax on right 07/17/2016   Chronic pain disorder 07/17/2016    Past Surgical History:  Procedure Laterality Date   CHOLECYSTECTOMY     ESOPHAGOGASTRODUODENOSCOPY  06/2020   done at [facility], candida in upper esophagus (treated with diflucan), ulcerative esophagitis at GE junction, gastritis in stomach, single ulcer in duodenal bulb, with duodenal mucosa showing no abnormality. No presence of varices     OB History     Gravida  0   Para  0   Term  0   Preterm  0   AB  0   Living  0      SAB  0   IAB  0   Ectopic  0   Multiple  0   Live Births  0           Family History  Problem Relation Age of Onset   Hypertension Mother    Hyperlipidemia Mother    Hyperlipidemia Maternal Grandfather     Social History   Tobacco Use   Smoking status: Every Day    Types: E-cigarettes   Smokeless tobacco: Never  Vaping Use   Vaping Use: Every day   Substances: Nicotine, Flavoring  Substance Use Topics   Alcohol use: No   Drug use: No    Home Medications Prior to  Admission medications   Medication Sig Start Date End Date Taking? Authorizing Provider  Accu-Chek Softclix Lancets lancets 4 (four) times daily. 07/07/20   [provider]  albuterol (ACCUNEB) 0.63 MG/3ML nebulizer solution Take by nebulization 4 (four) times daily. 07/27/20   [provider]  albuterol (PROVENTIL) (2.5 MG/3ML) 0.083% nebulizer solution Take 2.5 mg by nebulization every 6 (six) hours as needed for wheezing or shortness of breath.    [provider]  ARIPiprazole (ABILIFY) 5 MG tablet Take 5 mg by mouth daily. 06/20/21   [provider]  aspirin EC 81 MG tablet Take 162 mg by mouth daily. Swallow whole.    [provider]  cetirizine (ZYRTEC ALLERGY) 10 MG tablet Take 1 tablet (10 mg total) by mouth daily. 07/09/20   Avegno, Darrelyn Hillock, FNP  clonazePAM (KLONOPIN) 0.5 MG tablet Take 1 tablet (0.5 mg total) by mouth 2 (two) times daily as needed for anxiety. 09/09/20 07/28/21  Shelly Coss, MD  Continuous Blood Gluc Sensor (DEXCOM G6 SENSOR) MISC 4 Pieces by Does not apply route once a week. 08/07/21   Brita Romp, NP  Continuous Blood Gluc Transmit (DEXCOM G6 TRANSMITTER) MISC 1 Piece by Does not apply route as directed. 02/05/21   Brita Romp, NP  cromolyn (OPTICROM) 4 % ophthalmic solution 1 drop 2 (two) times daily. 01/28/21   [provider]  etonogestrel (NEXPLANON) 68 MG IMPL implant 1 each by Subdermal route once.    [provider]  furosemide (LASIX) 40 MG tablet Take 80 mg by mouth daily.  05/22/20   [provider]  glipiZIDE (GLUCOTROL XL) 5 MG 24 hr tablet Take 1 tablet (5 mg total) by mouth daily with breakfast. 07/30/21   Brita Romp, NP  GLOBAL EASE INJECT PEN NEEDLES 31G X 8 MM MISC USE 1 PEN NEEDLE THREE TIMES DAILY. 06/30/21   Brita Romp, NP  glucose blood (ACCU-CHEK GUIDE) test strip Use as instructed to check blood glucose four times daily 05/20/21   Brita Romp, NP   hyoscyamine (ANASPAZ) 0.125 MG TBDP disintergrating tablet Place 1 tablet (0.125 mg total) under the tongue 3 (three) times daily as needed for bladder spasms or cramping (Patient Preference). 07/28/21   Carlan, Chelsea L, NP  insulin aspart (NOVOLOG) 100 UNIT/ML FlexPen Inject 28-34 Units into the skin 3 (three) times daily with meals. 08/27/21   Brita Romp, NP  insulin glargine, 2 Unit Dial, (TOUJEO MAX SOLOSTAR) 300 UNIT/ML Solostar Pen Inject 120 Units into the skin at bedtime. 08/13/21   Brita Romp, NP  Insulin Pen Needle (PEN NEEDLES) 32G X 6 MM MISC 1 each by Does not apply route 3 (three) times  daily. 02/26/21   Brita Romp, NP  LACTULOSE PO Take by mouth. 10gm/15 ml - 30 mls  three times a day    [provider]  liraglutide (VICTOZA) 18 MG/3ML SOPN Inject 1.8 mg into the skin daily. 07/30/21   Brita Romp, NP  lisinopril (ZESTRIL) 5 MG tablet TAKE 1 TABLET ONCE DAILY. 08/11/21   Brita Romp, NP  metFORMIN (GLUCOPHAGE) 1000 MG tablet Take 1 tablet (1,000 mg total) by mouth 2 (two) times daily with a meal. 07/30/21   Brita Romp, NP  methadone (DOLOPHINE) 10 MG/5ML solution Take 60 mg by mouth every morning. 73m    [provider]  Multiple Vitamin (MULTIVITAMIN) tablet Take 1 tablet by mouth daily.    [provider]  nystatin (MYCOSTATIN/NYSTOP) powder Apply 1 application topically 3 (three) times daily. 05/19/21   Mesner, JCorene Cornea MD  omeprazole (PRILOSEC) 40 MG capsule Take 1 capsule (40 mg total) by mouth daily. 07/28/21   Carlan, Chelsea L, NP  ondansetron (ZOFRAN-ODT) 4 MG disintegrating tablet Take 4 mg by mouth every 8 (eight) hours as needed for nausea.  06/14/20   [provider]  oxyCODONE (ROXICODONE) 5 MG immediate release tablet Take 1 tablet (5 mg total) by mouth every 12 (twelve) hours as needed for breakthrough pain or severe pain. 05/19/21   Mesner, JCorene Cornea MD  potassium chloride (KLOR-CON) 10 MEQ tablet  Take 20 mEq by mouth daily.  04/24/20   [provider]  pregabalin (LYRICA) 75 MG capsule Take 75 mg by mouth in the morning, at noon, in the evening, and at bedtime.     [provider]  PROAIR HFA 108 ([phone]) MCG/ACT inhaler SMARTSIG:2 Puff(s) By Mouth Every 6 Hours PRN 04/04/21   [provider]  propranolol (INDERAL) 10 MG tablet Take 10 mg by mouth 3 (three) times daily. 08/08/20   [provider]  RESTASIS 0.05 % ophthalmic emulsion Place 1 drop into both eyes as needed (dry eye).  04/22/19   [provider]  rosuvastatin (CRESTOR) 5 MG tablet Take 1 tablet (5 mg total) by mouth daily. 08/25/21   RBrita Romp NP  spironolactone (ALDACTONE) 100 MG tablet Take 100 mg by mouth daily.  05/22/20   [provider]  sucralfate (CARAFATE) 1 g tablet Take 1 g by mouth 4 (four) times daily. 07/02/20   [provider]  triamcinolone (KENALOG) 0.1 % Apply topically daily. 02/04/21   [provider]  venlafaxine XR (EFFEXOR-XR) 150 MG 24 hr capsule Take total of 225 mg daily (150 mg + 75 mg ) 01/24/21   HNorman Clay MD  venlafaxine XR (EFFEXOR-XR) 75 MG 24 hr capsule To take along with 1520mfor a total of 225 mg daily 01/20/21   HiNorman ClayMD    Allergies    Iodine-131, Ivp dye [iodinated diagnostic agents], Ketorolac tromethamine, Tylenol [acetaminophen], Gabapentin, Morphine and related, Nsaids, Suboxone [buprenorphine hcl-naloxone hcl], and Vancomycin  Review of Systems   Review of Systems  Gastrointestinal:  Positive for abdominal pain.  All other systems reviewed and are negative.  Physical Exam Updated Vital Signs BP (!) 146/82 (BP Location: Right Arm)   Pulse 88   Temp 97.9 F (36.6 C) (Oral)   Resp 18   Ht 5' 9"  (1.753 m)   Wt 134 kg   LMP  (LMP Unknown)   SpO2 97%   BMI 43.63 kg/m   Physical Exam Vitals and nursing note reviewed.  Constitutional:  General: She is not in acute distress.     Appearance: She is well-developed. She is not diaphoretic.  HENT:     Head: Normocephalic and atraumatic.  Cardiovascular:     Rate and Rhythm: Normal rate and regular rhythm.     Heart sounds: No murmur heard.   No friction rub. No gallop.  Pulmonary:     Effort: Pulmonary effort is normal. No respiratory distress.     Breath sounds: Normal breath sounds. No wheezing.  Abdominal:     General: Bowel sounds are normal. There is no distension.     Palpations: Abdomen is soft.     Tenderness: There is abdominal tenderness in the epigastric area. There is no right CVA tenderness, left CVA tenderness, guarding or rebound.  Musculoskeletal:        General: Normal range of motion.     Cervical back: Normal range of motion and neck supple.  Skin:    General: Skin is warm and dry.  Neurological:     General: No focal deficit present.     Mental Status: She is alert and oriented to person, place, and time.    ED Results / Procedures / Treatments   Labs (all labs ordered are listed, but only abnormal results are displayed) Labs Reviewed  COMPREHENSIVE METABOLIC PANEL  LIPASE, BLOOD  CBC WITH DIFFERENTIAL/PLATELET  I-STAT BETA HCG BLOOD, ED ([facility], [facility], [facility] ONLY)    EKG None  Radiology No results found.  Procedures Procedures   Medications Ordered in ED Medications  sodium chloride 0.9 % bolus 1,000 mL (has no administration in time range)  ondansetron (ZOFRAN) injection 4 mg (has no administration in time range)  morphine 4 MG/ML injection 4 mg (has no administration in time range)  alum & mag hydroxide-simeth (MAALOX/MYLANTA) 200-200-20 MG/5ML suspension 30 mL (has no administration in time range)    And  lidocaine (XYLOCAINE) 2 % viscous mouth solution 15 mL (has no administration in time range)    ED Course  I have reviewed the triage vital signs and the nursing notes.  Pertinent labs & imaging results that were available during my care of the patient were reviewed by me  and considered in my medical decision making (see chart for details).    MDM Rules/Calculators/A&P  Patient with past medical history as per HPI presenting with a flareup of recurrent, chronic abdominal pain.  Patient's pain treated and IV fluids initiated.  Laboratory studies thus far show a glucose of 440.  IV insulin administered.  Patient will be signed out to oncoming provider for correction of her hyperglycemia and further observation.  Final Clinical Impression(s) / ED Diagnoses Final diagnoses:  None    Rx / DC Orders ED Discharge Orders     None        Veryl Speak, MD 09/15/21 [phone]

## 2021-09-14 NOTE — Discharge Instructions (Signed)
Call your primary care doctor or specialist as discussed in the next 2-3 days.   Continue checking your blood sugars at home.  If your blood sugar continues to remain high despite your home diabetes regimen, return back to the ER.  Return immediately back to the ER if:  Your symptoms worsen within the next 12-24 hours. You develop new symptoms such as new fevers, persistent vomiting, new pain, shortness of breath, or new weakness or numbness, or if you have any other concerns.

## 2021-09-14 NOTE — ED Provider Notes (Signed)
Patient signed out to me is pending insulin and glucose management as well as abdominal pain management.  Patient's pain appears chronic in nature appears improved with interventions today blood sugar is on an improving trend as well.  Advised continue close management at home and return to the ER if the blood sugar remains consistently elevated or if she has worsening pain or any additional concerns otherwise advised her to call her primary care doctor in 2 days for follow-up.    Luna Fuse, MD 09/14/21 [phone]

## 2021-09-14 NOTE — ED Notes (Signed)
ED Provider at bedside. 

## 2021-09-16 ENCOUNTER — Encounter (INDEPENDENT_AMBULATORY_CARE_PROVIDER_SITE_OTHER): Payer: Self-pay

## 2021-09-16 ENCOUNTER — Other Ambulatory Visit (INDEPENDENT_AMBULATORY_CARE_PROVIDER_SITE_OTHER): Payer: Self-pay

## 2021-09-16 DIAGNOSIS — R1319 Other dysphagia: Secondary | ICD-10-CM

## 2021-09-16 DIAGNOSIS — K921 Melena: Secondary | ICD-10-CM

## 2021-09-16 DIAGNOSIS — K625 Hemorrhage of anus and rectum: Secondary | ICD-10-CM

## 2021-09-17 ENCOUNTER — Encounter (HOSPITAL_COMMUNITY)
Admission: RE | Admit: 2021-09-17 | Discharge: 2021-09-17 | Disposition: A | Discharge status: Home / Self Care | Payer: Medicaid Other | Source: Ambulatory Visit | Attending: Gastroenterology | Admitting: Gastroenterology

## 2021-09-17 ENCOUNTER — Encounter (HOSPITAL_COMMUNITY): Payer: Self-pay

## 2021-09-17 ENCOUNTER — Other Ambulatory Visit: Payer: Self-pay

## 2021-09-17 ENCOUNTER — Ambulatory Visit: Payer: Medicaid Other | Admitting: Nurse Practitioner

## 2021-09-17 VITALS — BP 138/80 | HR 99 | Temp 98.3°F | Resp 16 | Ht 69.0 in | Wt 290.0 lb

## 2021-09-17 DIAGNOSIS — Z01812 Encounter for preprocedural laboratory examination: Secondary | ICD-10-CM | POA: Diagnosis present

## 2021-09-17 DIAGNOSIS — K759 Inflammatory liver disease, unspecified: Secondary | ICD-10-CM | POA: Insufficient documentation

## 2021-09-17 DIAGNOSIS — R1319 Other dysphagia: Secondary | ICD-10-CM | POA: Diagnosis not present

## 2021-09-17 DIAGNOSIS — Z01818 Encounter for other preprocedural examination: Secondary | ICD-10-CM

## 2021-09-17 DIAGNOSIS — K921 Melena: Secondary | ICD-10-CM | POA: Diagnosis not present

## 2021-09-17 DIAGNOSIS — K625 Hemorrhage of anus and rectum: Secondary | ICD-10-CM | POA: Diagnosis not present

## 2021-09-17 LAB — CBC WITH DIFFERENTIAL/PLATELET
Abs Immature Granulocytes: 0.01 10*3/uL (ref 0.00–0.07)
Basophils Absolute: 0 10*3/uL (ref 0.0–0.1)
Basophils Relative: 1 %
Eosinophils Absolute: 0.2 10*3/uL (ref 0.0–0.5)
Eosinophils Relative: 6 %
HCT: 43.1 % (ref 36.0–46.0)
Hemoglobin: 14.7 g/dL (ref 12.0–15.0)
Immature Granulocytes: 0 %
Lymphocytes Relative: 23 %
Lymphs Abs: 0.6 10*3/uL — ABNORMAL LOW (ref 0.7–4.0)
MCH: 32.1 pg (ref 26.0–34.0)
MCHC: 34.1 g/dL (ref 30.0–36.0)
MCV: 94.1 fL (ref 80.0–100.0)
Monocytes Absolute: 0.2 10*3/uL (ref 0.1–1.0)
Monocytes Relative: 8 %
Neutro Abs: 1.7 10*3/uL (ref 1.7–7.7)
Neutrophils Relative %: 62 %
Platelets: 54 10*3/uL — ABNORMAL LOW (ref 150–400)
RBC: 4.58 MIL/uL (ref 3.87–5.11)
RDW: 13.9 % (ref 11.5–15.5)
WBC: 2.8 10*3/uL — ABNORMAL LOW (ref 4.0–10.5)
nRBC: 0 % (ref 0.0–0.2)

## 2021-09-17 LAB — RAPID URINE DRUG SCREEN, HOSP PERFORMED
Amphetamines: NOT DETECTED
Barbiturates: NOT DETECTED
Benzodiazepines: NOT DETECTED
Cocaine: NOT DETECTED
Opiates: NOT DETECTED
Tetrahydrocannabinol: NOT DETECTED

## 2021-09-17 LAB — BASIC METABOLIC PANEL
Anion gap: 11 (ref 5–15)
BUN: 5 mg/dL — ABNORMAL LOW (ref 6–20)
CO2: 23 mmol/L (ref 22–32)
Calcium: 8.7 mg/dL — ABNORMAL LOW (ref 8.9–10.3)
Chloride: 98 mmol/L (ref 98–111)
Creatinine, Ser: 0.65 mg/dL (ref 0.44–1.00)
GFR, Estimated: >60 mL/min (ref >60)
Glucose, Bld: 519 mg/dL (ref 70–99)
Potassium: 4.2 mmol/L (ref 3.5–5.1)
Sodium: 132 mmol/L — ABNORMAL LOW (ref 135–145)

## 2021-09-17 LAB — HCG, SERUM, QUALITATIVE: Preg, Serum: NEGATIVE

## 2021-09-17 NOTE — Progress Notes (Signed)
Patient Glucose 519 critical value.  Dr Jenetta Downer notified of result. The patient will be cancelled for now.  Contacted patients mother about keeping a log of blood sugars going forward, once the blood sugars start to fall below the 200 range then the patient can call the GI offfice for an appointment to be rescheduled.

## 2021-09-17 NOTE — Patient Instructions (Signed)
Jaime [name]  09/17/2021     @PREFPERIOPPHARMACY @   Your procedure is scheduled on 09/19/2021.  Report to [name] at 9:15 A.M.  Call this number if you have problems the morning of surgery:  [phone]   Remember:  Do not eat or drink after midnight.     Take these medicines the morning of surgery with A SIP OF WATER : Abilify, Zyrtec, Klonopin, Proprandol, Lyrica, Methadone, Hyoscyamine, Omeprazole, Effexor.   Do not take any diabetic medications the morning of the procedure.   Take 1/2 of your night time insulin Glargine the night before the procedure (60u)   Please use your Inhaler before coming to the hospital.     Do not wear jewelry, make-up or nail polish.  Do not wear lotions, powders, or perfumes, or deodorant.  Do not shave 48 hours prior to surgery.  Men may shave face and neck.  Do not bring valuables to the hospital.  [facility] is not responsible for any belongings or valuables.  Contacts, dentures or bridgework may not be worn into surgery.  Leave your suitcase in the car.  After surgery it may be brought to your room.  For patients admitted to the hospital, discharge time will be determined by your treatment team.  Patients discharged the day of surgery will not be allowed to drive home.   Name and phone number of your driver:   family Special instructions:  N/A  Please read over the following fact sheets that you were given. Care and Recovery After Surgery  Upper Endoscopy, Adult Upper endoscopy is a procedure to look inside the upper GI (gastrointestinal) tract. The upper GI tract is made up of: The part of the body that moves food from your mouth to your stomach (esophagus). The stomach. The first part of your small intestine (duodenum). This procedure is also called esophagogastroduodenoscopy (EGD) or gastroscopy. In this procedure, your health care provider passes a thin, flexible tube (endoscope) through your mouth and down your esophagus  into your stomach. A small camera is attached to the end of the tube. Images from the camera appear on a monitor in the exam room. During this procedure, your health care provider may also remove a small piece of tissue to be sent to a lab and examined under a microscope (biopsy). Your health care provider may do an upper endoscopy to diagnose cancers of the upper GI tract. You may also have this procedure to find the cause of other conditions, such as: Stomach pain. Heartburn. Pain or problems when swallowing. Nausea and vomiting. Stomach bleeding. Stomach ulcers. Tell a health care provider about: Any allergies you have. All medicines you are taking, including vitamins, herbs, eye drops, creams, and over-the-counter medicines. Any problems you or family members have had with anesthetic medicines. Any blood disorders you have. Any surgeries you have had. Any medical conditions you have. Whether you are pregnant or may be pregnant. What are the risks? Generally, this is a safe procedure. However, problems may occur, including: Infection. Bleeding. Allergic reactions to medicines. A tear or hole (perforation) in the esophagus, stomach, or duodenum. What happens before the procedure? Staying hydrated Follow instructions from your health care provider about hydration, which may include: Up to 2 hours before the procedure - you may continue to drink clear liquids, such as water, clear fruit juice, black coffee, and plain tea.  Eating and drinking restrictions Follow instructions from your health care provider about eating and drinking, which may include:  8 hours before the procedure - stop eating heavy meals or foods, such as meat, fried foods, or fatty foods. 6 hours before the procedure - stop eating light meals or foods, such as toast or cereal. 6 hours before the procedure - stop drinking milk or drinks that contain milk. 2 hours before the procedure - stop drinking clear  liquids. Medicines Ask your health care provider about: Changing or stopping your regular medicines. This is especially important if you are taking diabetes medicines or blood thinners. Taking medicines such as aspirin and ibuprofen. These medicines can thin your blood. Do not take these medicines unless your health care provider tells you to take them. Taking over-the-counter medicines, vitamins, herbs, and supplements. General instructions Plan to have someone take you home from the hospital or clinic. If you will be going home right after the procedure, plan to have someone with you for 24 hours. Ask your health care provider what steps will be taken to help prevent infection. What happens during the procedure?  An IV will be inserted into one of your veins. You may be given one or more of the following: A medicine to help you relax (sedative). A medicine to numb the throat (local anesthetic). You will lie on your left side on an exam table. Your health care provider will pass the endoscope through your mouth and down your esophagus. Your health care provider will use the scope to check the inside of your esophagus, stomach, and duodenum. Biopsies may be taken. The endoscope will be removed. The procedure may vary among health care providers and hospitals. What happens after the procedure? Your blood pressure, heart rate, breathing rate, and blood oxygen level will be monitored until you leave the hospital or clinic. Do not drive for 24 hours if you were given a sedative during your procedure. When your throat is no longer numb, you may be given some fluids to drink. It is up to you to get the results of your procedure. Ask your health care provider, or the department that is doing the procedure, when your results will be ready. Summary Upper endoscopy is a procedure to look inside the upper GI tract. During the procedure, an IV will be inserted into one of your veins. You may be given  a medicine to help you relax. A medicine will be used to numb your throat. The endoscope will be passed through your mouth and down your esophagus. This information is not intended to replace advice given to you by your health care provider. Make sure you discuss any questions you have with your health care provider. Document Revised: 04/13/2018 Document Reviewed: 03/21/2018 Elsevier Patient Education  2022 [place]. Colonoscopy, Adult A colonoscopy is a procedure to look at the entire large intestine. This procedure is done using a long, thin, flexible tube that has a camera on the end. You may have a colonoscopy: As a part of normal colorectal screening. If you have certain symptoms, such as: A low number of red blood cells in your blood (anemia). Diarrhea that does not go away. Pain in your abdomen. Blood in your stool. A colonoscopy can help screen for and diagnose medical problems, including: Tumors. Extra tissue that grows where mucus forms (polyps). Inflammation. Areas of bleeding. Tell your health care provider about: Any allergies you have. All medicines you are taking, including vitamins, herbs, eye drops, creams, and over-the-counter medicines. Any problems you or family members have had with anesthetic medicines. Any blood disorders you have.  Any surgeries you have had. Any medical conditions you have. Any problems you have had with having bowel movements. Whether you are pregnant or may be pregnant. What are the risks? Generally, this is a safe procedure. However, problems may occur, including: Bleeding. Damage to your intestine. Allergic reactions to medicines given during the procedure. Infection. This is rare. What happens before the procedure? Eating and drinking restrictions Follow instructions from your health care provider about eating or drinking restrictions, which may include: A few days before the procedure: Follow a low-fiber diet. Avoid nuts,  seeds, dried fruit, raw fruits, and vegetables. 1-3 days before the procedure: Eat only gelatin dessert or ice pops. Drink only clear liquids, such as water, clear juice, clear broth or bouillon, black coffee or tea, or clear soft drinks or sports drinks. Avoid liquids that contain red or purple dye. The day of the procedure: Do not eat solid foods. You may continue to drink clear liquids until up to 2 hours before the procedure. Do not eat or drink anything starting 2 hours before the procedure, or within the time period that your health care provider recommends. Bowel prep If you were prescribed a bowel prep to take by mouth (orally) to clean out your colon: Take it as told by your health care provider. Starting the day before your procedure, you will need to drink a large amount of liquid medicine. The liquid will cause you to have many bowel movements of loose stool until your stool becomes almost clear or light green. If your skin or the opening between the buttocks (anus) gets irritated from diarrhea, you may relieve the irritation using: Wipes with medicine in them, such as adult wet wipes with aloe and vitamin E. A product to soothe skin, such as petroleum jelly. If you vomit while drinking the bowel prep: Take a break for up to 60 minutes. Begin the bowel prep again. Call your health care provider if you keep vomiting or you cannot take the bowel prep without vomiting. To clean out your colon, you may also be given: Laxative medicines. These help you have a bowel movement. Instructions for enema use. An enema is liquid medicine injected into your rectum. Medicines Ask your health care provider about: Changing or stopping your regular medicines or supplements. This is especially important if you are taking iron supplements, diabetes medicines, or blood thinners. Taking medicines such as aspirin and ibuprofen. These medicines can thin your blood. Do not take these medicines unless your  health care provider tells you to take them. Taking over-the-counter medicines, vitamins, herbs, and supplements. General instructions Ask your health care provider what steps will be taken to help prevent infection. These may include washing skin with a germ-killing soap. Plan to have someone take you home from the hospital or clinic. What happens during the procedure?  An IV will be inserted into one of your veins. You may be given one or more of the following: A medicine to help you relax (sedative). A medicine to numb the area (local anesthetic). A medicine to make you fall asleep (general anesthetic). This is rarely needed. You will lie on your side with your knees bent. The tube will: Have oil or gel put on it (be lubricated). Be inserted into your anus. Be gently eased through all parts of your large intestine. Air will be sent into your colon to keep it open. This may cause some pressure or cramping. Images will be taken with the camera and will appear on  a screen. A small tissue sample may be removed to be looked at under a microscope (biopsy). The tissue may be sent to a lab for testing if any signs of problems are found. If small polyps are found, they may be removed and checked for cancer cells. When the procedure is finished, the tube will be removed. The procedure may vary among health care providers and hospitals. What happens after the procedure? Your blood pressure, heart rate, breathing rate, and blood oxygen level will be monitored until you leave the hospital or clinic. You may have a small amount of blood in your stool. You may pass gas and have mild cramping or bloating in your abdomen. This is caused by the air that was used to open your colon during the exam. Do not drive for 24 hours after the procedure. It is up to you to get the results of your procedure. Ask your health care provider, or the department that is doing the procedure, when your results will be  ready. Summary A colonoscopy is a procedure to look at the entire large intestine. Follow instructions from your health care provider about eating and drinking before the procedure. If you were prescribed an oral bowel prep to clean out your colon, take it as told by your health care provider. During the colonoscopy, a flexible tube with a camera on its end is inserted into the anus and then passed into the other parts of the large intestine. This information is not intended to replace advice given to you by your health care provider. Make sure you discuss any questions you have with your health care provider. Document Revised: 05/12/2019 Document Reviewed: 05/12/2019 Elsevier Patient Education  [place].

## 2021-09-18 ENCOUNTER — Ambulatory Visit (INDEPENDENT_AMBULATORY_CARE_PROVIDER_SITE_OTHER): Payer: Medicaid Other | Admitting: Nurse Practitioner

## 2021-09-18 ENCOUNTER — Encounter: Payer: Self-pay | Admitting: Nurse Practitioner

## 2021-09-18 VITALS — BP 140/72 | HR 90 | Ht 69.0 in | Wt 283.6 lb

## 2021-09-18 DIAGNOSIS — Z91119 Patient's noncompliance with dietary regimen due to unspecified reason: Secondary | ICD-10-CM

## 2021-09-18 DIAGNOSIS — E1165 Type 2 diabetes mellitus with hyperglycemia: Secondary | ICD-10-CM | POA: Diagnosis not present

## 2021-09-18 DIAGNOSIS — E782 Mixed hyperlipidemia: Secondary | ICD-10-CM

## 2021-09-18 MED ORDER — INSULIN ASPART 100 UNIT/ML FLEXPEN: 30.0000 [IU] | PEN_INJECTOR | Freq: Three times a day (TID) | SUBCUTANEOUS | 0 refills | Status: DC

## 2021-09-18 NOTE — Patient Instructions (Signed)

## 2021-09-18 NOTE — Progress Notes (Signed)
09/18/2021, 12:27 PM     Endocrinology Follow Up Visit  Subjective:    Patient ID: Jaime [name], female    DOB: 1986-05-27.  Jaime [name] is being seen in follow up for management of currently uncontrolled symptomatic diabetes requested by  Neale Burly, MD.   Past Medical History:  Diagnosis Date   Anxiety    Asthma    Chronic abdominal pain    Chronic back pain    Cirrhosis ([place])    Cirrhosis ([place])    COPD (chronic obstructive pulmonary disease) ([place])    Depression    Diabetes mellitus without complication ([place])    Diabetes mellitus, type II ([place])    Hepatitis C    Insomnia    Long-term current use of methadone for opiate dependence ([place])    Lupus ([place])    Migraine headache    Neuropathy    Nocturnal seizures ([place])    Peptic ulcer    Spleen enlarged     Past Surgical History:  Procedure Laterality Date   CHOLECYSTECTOMY     ESOPHAGOGASTRODUODENOSCOPY  06/2020   done at [facility], candida in upper esophagus (treated with diflucan), ulcerative esophagitis at GE junction, gastritis in stomach, single ulcer in duodenal bulb, with duodenal mucosa showing no abnormality. No presence of varices    Social History   Socioeconomic History   Marital status: Single    Spouse name: Not on file   Number of children: Not on file   Years of education: Not on file   Highest education level: Not on file  Occupational History   Not on file  Tobacco Use   Smoking status: Every Day    Types: E-cigarettes   Smokeless tobacco: Never  Vaping Use   Vaping Use: Every day   Substances: Nicotine, Flavoring  Substance and Sexual Activity   Alcohol use: No   Drug use: No   Sexual activity: Not Currently    Birth control/protection: Implant  Other Topics Concern   Not on file  Social History Narrative   Not on file   Social Determinants of Health   Financial Resource Strain: Not on file   Food Insecurity: Not on file  Transportation Needs: Not on file  Physical Activity: Not on file  Stress: Not on file  Social Connections: Not on file    Family History  Problem Relation Age of Onset   Hypertension Mother    Hyperlipidemia Mother    Hyperlipidemia Maternal Grandfather     Outpatient Encounter Medications as of 09/18/2021  Medication Sig   Accu-Chek Softclix Lancets lancets 4 (four) times daily.   albuterol (ACCUNEB) 0.63 MG/3ML nebulizer solution Take by nebulization 4 (four) times daily.   albuterol (PROVENTIL) (2.5 MG/3ML) 0.083% nebulizer solution Take 2.5 mg by nebulization every 6 (six) hours as needed for wheezing or shortness of breath.   ARIPiprazole (ABILIFY) 5 MG tablet Take 5 mg by mouth daily.   aspirin EC 81 MG tablet Take 162 mg by mouth daily. Swallow whole.   cetirizine (ZYRTEC ALLERGY) 10 MG tablet Take 1 tablet (10 mg total) by mouth daily.   Continuous  Blood Gluc Sensor (DEXCOM G6 SENSOR) MISC 4 Pieces by Does not apply route once a week.   Continuous Blood Gluc Transmit (DEXCOM G6 TRANSMITTER) MISC 1 Piece by Does not apply route as directed.   cromolyn (OPTICROM) 4 % ophthalmic solution 1 drop 2 (two) times daily.   etonogestrel (NEXPLANON) 68 MG IMPL implant 1 each by Subdermal route once.   furosemide (LASIX) 40 MG tablet Take 80 mg by mouth daily.    glipiZIDE (GLUCOTROL XL) 5 MG 24 hr tablet Take 1 tablet (5 mg total) by mouth daily with breakfast.   GLOBAL EASE INJECT PEN NEEDLES 31G X 8 MM MISC USE 1 PEN NEEDLE THREE TIMES DAILY.   glucose blood (ACCU-CHEK GUIDE) test strip Use as instructed to check blood glucose four times daily   hyoscyamine (ANASPAZ) 0.125 MG TBDP disintergrating tablet Place 1 tablet (0.125 mg total) under the tongue 3 (three) times daily as needed for bladder spasms or cramping (Patient Preference).   insulin glargine, 2 Unit Dial, (TOUJEO MAX SOLOSTAR) 300 UNIT/ML Solostar Pen Inject 120 Units into the skin at  bedtime.   Insulin Pen Needle (PEN NEEDLES) 32G X 6 MM MISC 1 each by Does not apply route 3 (three) times daily.   LACTULOSE PO Take by mouth. 10gm/15 ml - 30 mls  three times a day   liraglutide (VICTOZA) 18 MG/3ML SOPN Inject 1.8 mg into the skin daily.   lisinopril (ZESTRIL) 5 MG tablet TAKE 1 TABLET ONCE DAILY.   metFORMIN (GLUCOPHAGE) 1000 MG tablet Take 1 tablet (1,000 mg total) by mouth 2 (two) times daily with a meal.   methadone (DOLOPHINE) 10 MG/5ML solution Take 60 mg by mouth every morning. 73m   Multiple Vitamin (MULTIVITAMIN) tablet Take 1 tablet by mouth daily.   nystatin (MYCOSTATIN/NYSTOP) powder Apply 1 application topically 3 (three) times daily.   omeprazole (PRILOSEC) 40 MG capsule Take 1 capsule (40 mg total) by mouth daily.   ondansetron (ZOFRAN-ODT) 4 MG disintegrating tablet Take 4 mg by mouth every 8 (eight) hours as needed for nausea.    oxyCODONE (ROXICODONE) 5 MG immediate release tablet Take 1 tablet (5 mg total) by mouth every 12 (twelve) hours as needed for breakthrough pain or severe pain.   potassium chloride (KLOR-CON) 10 MEQ tablet Take 20 mEq by mouth daily.    pregabalin (LYRICA) 75 MG capsule Take 75 mg by mouth in the morning, at noon, in the evening, and at bedtime.    PROAIR HFA 108 (90 Base) MCG/ACT inhaler SMARTSIG:2 Puff(s) By Mouth Every 6 Hours PRN   propranolol (INDERAL) 10 MG tablet Take 10 mg by mouth 3 (three) times daily.   RESTASIS 0.05 % ophthalmic emulsion Place 1 drop into both eyes as needed (dry eye).    rosuvastatin (CRESTOR) 5 MG tablet Take 1 tablet (5 mg total) by mouth daily.   spironolactone (ALDACTONE) 100 MG tablet Take 100 mg by mouth daily.    sucralfate (CARAFATE) 1 g tablet Take 1 g by mouth 4 (four) times daily.   triamcinolone (KENALOG) 0.1 % Apply topically daily.   venlafaxine XR (EFFEXOR-XR) 150 MG 24 hr capsule Take total of 225 mg daily (150 mg + 75 mg )   venlafaxine XR (EFFEXOR-XR) 75 MG 24 hr capsule To take along  with 1576mfor a total of 225 mg daily   [DISCONTINUED] insulin aspart (NOVOLOG) 100 UNIT/ML FlexPen Inject 28-34 Units into the skin 3 (three) times daily with meals.   clonazePAM (KLONOPIN)  0.5 MG tablet Take 1 tablet (0.5 mg total) by mouth 2 (two) times daily as needed for anxiety.   insulin aspart (NOVOLOG) 100 UNIT/ML FlexPen Inject 30-36 Units into the skin 3 (three) times daily with meals.   No facility-administered encounter medications on file as of 09/18/2021.    ALLERGIES: Allergies  Allergen Reactions   Iodine-131 Anaphylaxis, Shortness Of Breath and Swelling   Ivp Dye [Iodinated Diagnostic Agents] Anaphylaxis    Skin gets very red, unable to walk    Ketorolac Tromethamine Shortness Of Breath   Tylenol [Acetaminophen] Other (See Comments)    Due to cirrhosis    Gabapentin    Morphine And Related     Pt says she is not allergic to Morphine 08/12/20   Nsaids Other (See Comments)    Flares ulcers   Suboxone [Buprenorphine Hcl-Naloxone Hcl]    Vancomycin Swelling    Facial swelling,     VACCINATION STATUS: Immunization History  Administered Date(s) Administered   Influenza,inj,Quad PF,6+ Mos 07/18/2016   Moderna Sars-Covid-2 Vaccination 03/07/2020, 04/04/2020   Pneumococcal Polysaccharide-23 07/18/2016, 08/01/2020    Diabetes She presents for her follow-up diabetic visit. She has type 2 diabetes mellitus. Onset time: She was diagnosed at approximate age of 35 years. Her disease course has been fluctuating. There are no hypoglycemic associated symptoms. Pertinent negatives for hypoglycemia include no headaches, nervousness/anxiousness or pallor. (Had symptoms of hypoglycemia when glucose dropped to 117.) Associated symptoms include blurred vision, fatigue, foot paresthesias, polydipsia and polyuria. Pertinent negatives for diabetes include no chest pain, no polyphagia and no weight loss. There are no hypoglycemic complications. (History of unresponsiveness requiring EMS  and hospitalization- none recent) Symptoms are stable. Diabetic complications include heart disease and peripheral neuropathy. Risk factors for coronary artery disease include diabetes mellitus, obesity, sedentary lifestyle, dyslipidemia and hypertension. Current diabetic treatment includes insulin injections and oral agent (monotherapy) (and Victoza). She is compliant with treatment most of the time. Her weight is fluctuating minimally. She is following a low salt and generally unhealthy diet. When asked about meal planning, she reported none. She has had a previous visit with a dietitian. She participates in exercise intermittently. Her home blood glucose trend is increasing steadily. Her breakfast blood glucose range is generally >200 mg/dl. Her lunch blood glucose range is generally >200 mg/dl. Her dinner blood glucose range is generally >200 mg/dl. Her bedtime blood glucose range is generally >200 mg/dl. Her overall blood glucose range is >200 mg/dl. (She presents today, accompanied by her mother, with her CGM, no logs, showing worsening glycemic profile.  She was not due for another A1c today.  She did relatively well during her trip to [place], had better improvement in her glucose readings, likely related to the amount of physical activity she was performing.  Now that she is home, she is mostly sedentary which is contributing to her rebounding glucose readings.  Analysis of her CGM shows TIR 0%, TAR  (in very high range) 100%, TBR 0%.) An ACE inhibitor/angiotensin II receptor blocker is being taken. She does not see a podiatrist.Eye exam is not current.  Hypertension This is a chronic problem. The current episode started more than 1 month ago. The problem is unchanged. The problem is controlled. Associated symptoms include blurred vision and peripheral edema. Pertinent negatives include no chest pain, headaches, palpitations or shortness of breath. There are no associated agents to hypertension. Risk  factors for coronary artery disease include diabetes mellitus, dyslipidemia, obesity and sedentary lifestyle. Past treatments include diuretics and  beta blockers. The current treatment provides no improvement. There are no compliance problems.     Review of systems  Constitutional: + Minimally fluctuating body weight,  current Body mass index is 41.88 kg/m. , + fatigue, no subjective hyperthermia, no subjective hypothermia Eyes: no blurry vision, no xerophthalmia ENT: no sore throat, no nodules palpated in throat, no dysphagia/odynophagia, no hoarseness Cardiovascular: no chest pain, no shortness of breath, no palpitations, + leg swelling Respiratory: no cough, no shortness of breath Gastrointestinal: no nausea/vomiting/diarrhea Genitourinary: + polyuria Musculoskeletal: no muscle/joint aches Skin: no rashes, no hyperemia Neurological: no tremors, no numbness, no tingling, no dizziness Psychiatric: no depression, no anxiety  Objective:     BP 140/72   Pulse 90   Ht 5' 9"  (1.753 m)   Wt 283 lb 9.6 oz (128.6 kg)   LMP  (LMP Unknown)   SpO2 96%   BMI 41.88 kg/m   Wt Readings from Last 3 Encounters:  09/18/21 283 lb 9.6 oz (128.6 kg)  09/17/21 290 lb (131.5 kg)  09/14/21 295 lb 6.7 oz (134 kg)    BP Readings from Last 3 Encounters:  09/18/21 140/72  09/17/21 138/80  09/14/21 114/64     Physical Exam- Limited  Constitutional:  Body mass index is 41.88 kg/m. , not in acute distress, normal state of mind, Eyes:  EOMI, no exophthalmos Neck: Supple Cardiovascular: RRR, no murmurs, rubs, or gallops, 1+ pitting edema to BLE Respiratory: Adequate breathing efforts, no crackles, rales, rhonchi, or wheezing Musculoskeletal: no gross deformities, strength intact in all four extremities, no gross restriction of joint movements Skin:  no rashes, no hyperemia Neurological: no tremor with outstretched hands   Diabetic Foot Exam - Simple   No data filed    CMP ( most  recent) CMP     Component Value Date/Time   NA 132 (L) 09/17/2021 1433   NA 135 02/19/2021 0822   K 4.2 09/17/2021 1433   CL 98 09/17/2021 1433   CO2 23 09/17/2021 1433   GLUCOSE 519 (HH) 09/17/2021 1433   BUN 5 (L) 09/17/2021 1433   BUN 6 02/19/2021 0822   CREATININE 0.65 09/17/2021 1433   CALCIUM 8.7 (L) 09/17/2021 1433   PROT 7.0 09/14/2021 0629   PROT 7.0 02/19/2021 0822   ALBUMIN 3.1 (L) 09/14/2021 0629   ALBUMIN 3.6 (L) 02/19/2021 0822   AST 33 09/14/2021 0629   ALT 39 09/14/2021 0629   ALKPHOS 147 (H) 09/14/2021 0629   BILITOT 1.4 (H) 09/14/2021 0629   BILITOT 0.5 02/19/2021 0822   GFRNONAA >60 09/17/2021 1433   GFRAA >60 08/01/2020 0704     Diabetic Labs (most recent): Lab Results  Component Value Date   HGBA1C 11.4 (H) 07/29/2021   HGBA1C 10.3 (A) 05/28/2021   HGBA1C 8.9 (A) 02/26/2021     Lipid Panel ( most recent) Lipid Panel     Component Value Date/Time   CHOL 210 (H) 07/23/2020 0842   TRIG 86 08/12/2020 1023   HDL 60 07/23/2020 0842   CHOLHDL 3.5 07/23/2020 0842   LDLCALC 116 (H) 07/23/2020 0842   LABVLDL 34 07/23/2020 0842      Lab Results  Component Value Date   TSH 0.635 09/08/2020   TSH 0.993 07/30/2020   TSH 1.990 07/23/2020   TSH 1.690 08/22/2010   FREET4 0.72 (L) 07/23/2020   FREET4 1.11 08/22/2010      Assessment & Plan:   1) Type 2 diabetes mellitus with hyperglycemia, without long-term current use of insulin (  [place])  - Jaime [name] has currently uncontrolled symptomatic type 2 DM since  35 years of age.  She presents today, accompanied by her mother, with her CGM, no logs, showing worsening glycemic profile.  She was not due for another A1c today.  She did relatively well during her trip to [place], had better improvement in her glucose readings, likely related to the amount of physical activity she was performing.  Now that she is home, she is mostly sedentary which is contributing to her rebounding glucose readings.  Analysis  of her CGM shows TIR 0%, TAR  (in very high range) 100%, TBR 0%.  - I had a long discussion with her about the progressive nature of diabetes and the pathology behind its complications.  -her diabetes is complicated by obesity/sedentary life and she remains at a high risk for more acute and chronic complications which include CAD, CVA, CKD, retinopathy, and neuropathy. These are all discussed in detail with her.  - Nutritional counseling repeated at each appointment due to patients tendency to fall back in to old habits.  - The patient admits there is a room for improvement in their diet and drink choices. -  Suggestion is made for the patient to avoid simple carbohydrates from their diet including Cakes, Sweet Desserts / Pastries, Ice Cream, Soda (diet and regular), Sweet Tea, Candies, Chips, Cookies, Sweet Pastries, Store Bought Juices, Alcohol in Excess of 1-2 drinks a day, Artificial Sweeteners, Coffee Creamer, and "Sugar-free" Products. This will help patient to have stable blood glucose profile and potentially avoid unintended weight gain.   - I encouraged the patient to switch to unprocessed or minimally processed complex starch and increased protein intake (animal or plant source), fruits, and vegetables.   - Patient is advised to stick to a routine mealtimes to eat 3 meals a day and avoid unnecessary snacks (to snack only to correct hypoglycemia).  - I have approached her with the following individualized plan to manage  her diabetes and patient agrees:   -Since her recent hospitalization, her mother has assumed responsibility for her diabetes management.  Number 1 priority will be to avoid hypoglycemia for her given her comorbidities.    -She will tolerate further increase in her Toujeo to 120 units SQ nightly and increase in her Humalog to 30-36 units TID with meals if glucose is above 90 and she is eating (Specific instructions on how to titrate insulin dosage based on glucose readings  given to patient in writing).  She can continue Victoza 1.8 mg SQ daily, Metformin 1000 mg po twice daily with meals, and Glipizide 5 mg XL daily with breakfast.  We discussed in detail, again, the need for her to comply with dietary restrictions.  I also recommended she start exercising routinely now that she sees how it affected her glucose readings while in [place].  -She is encouraged to continue to monitor glucose 4 times daily (using her CGM), before meals and before bed and log on the clinic sheets provided.  They are instructed to call the clinic if she has readings less than 70 or greater than 300 for 3 tests in a row.     - Specific targets for  A1c;  LDL, HDL,  and Triglycerides were discussed with the patient.  2) Blood Pressure /Hypertension: Her blood pressure is controlled to target.  She is advised to continue Lasix 80 mg po daily, continue Lisinopril 5 mg po daily, continue Propanolol 10 mg po TID, and Aldactone 100 mg  o daily  3) Lipids/Hyperlipidemia:   Review of her recent lipid profile from 07/23/20 shows uncontrolled LDL at 116 and elevated triglycerides of 197.  She is advised to continue Crestor 5 mg po daily at bedtime.  Side effects and precautions discussed with her.  Will recheck lipid panel on subsequent visits.  4)  Weight/Diet:  Her Body mass index is 41.88 kg/m.  -   clearly complicating her diabetes care.  She has recently lost 8 lbs. she is  a candidate for modest weight loss. I discussed with her the fact that loss of 5 - 10% of her  current body weight will have the most impact on her diabetes management.  Exercise, and detailed carbohydrates information provided  -  detailed on discharge instructions.  5) Chronic Care/Health Maintenance: -she is on ACE and statin medications and is encouraged to initiate and continue to follow up with Ophthalmology, Dentist,  Podiatrist at least yearly or according to recommendations, and advised to stay away from smoking. I have  recommended yearly flu vaccine and pneumonia vaccine at least every 5 years; moderate intensity exercise for up to 150 minutes weekly; and  sleep for at least 7 hours a day.  - she is advised to maintain close follow up with Hasanaj, Samul Dada, MD for primary care needs, as well as her other providers for optimal and coordinated care.      I spent 40 minutes in the care of the patient today including review of labs from [facility], Lipids, Thyroid Function, Hematology (current and previous including abstractions from other facilities); face-to-face time discussing  her blood glucose readings/logs, discussing hypoglycemia and hyperglycemia episodes and symptoms, medications doses, her options of short and long term treatment based on the latest standards of care / guidelines;  discussion about incorporating lifestyle medicine;  and documenting the encounter.    Please refer to Patient Instructions for Blood Glucose Monitoring and Insulin/Medications Dosing Guide"  in media tab for additional information. Please  also refer to " Patient Self Inventory" in the Media  tab for reviewed elements of pertinent patient history.  Jaime [name] participated in the discussions, expressed understanding, and voiced agreement with the above plans.  All questions were answered to her satisfaction. she is encouraged to contact clinic should she have any questions or concerns prior to her return visit.    Follow up plan: - Return in about 1 month (around 10/18/2021) for Diabetes F/U, Bring meter and logs.    Jaime [name], [facility] [address] [place], [place] [address] Phone: [phone] Fax: [phone]   09/18/2021, 12:27 PM

## 2021-09-19 ENCOUNTER — Ambulatory Visit (HOSPITAL_COMMUNITY): Admission: RE | Admit: 2021-09-19 | Payer: Medicaid Other | Source: Home / Self Care | Admitting: Gastroenterology

## 2021-09-19 ENCOUNTER — Encounter (HOSPITAL_COMMUNITY): Admission: RE | Payer: Self-pay | Source: Home / Self Care

## 2021-09-19 SURGERY — COLONOSCOPY WITH PROPOFOL
Anesthesia: Monitor Anesthesia Care

## 2021-10-03 ENCOUNTER — Ambulatory Visit
Admission: EM | Admit: 2021-10-03 | Discharge: 2021-10-03 | Disposition: A | Discharge status: Home / Self Care | Payer: Medicaid Other | Attending: Family Medicine | Admitting: Family Medicine

## 2021-10-03 ENCOUNTER — Other Ambulatory Visit: Payer: Self-pay

## 2021-10-03 ENCOUNTER — Encounter: Payer: Self-pay | Admitting: Emergency Medicine

## 2021-10-03 DIAGNOSIS — R8290 Unspecified abnormal findings in urine: Secondary | ICD-10-CM | POA: Diagnosis not present

## 2021-10-03 DIAGNOSIS — N390 Urinary tract infection, site not specified: Secondary | ICD-10-CM

## 2021-10-03 LAB — POCT URINALYSIS DIP (MANUAL ENTRY)
Glucose, UA: 250 mg/dL — AB
Nitrite, UA: POSITIVE — AB
Protein Ur, POC: 100 mg/dL — AB
Spec Grav, UA: >=1.03 — AB (ref 1.010–1.025)
Urobilinogen, UA: >=8 E.U./dL — AB
pH, UA: 7 (ref 5.0–8.0)

## 2021-10-03 MED ORDER — CEPHALEXIN 500 MG PO CAPS: 500.0000 mg | ORAL_CAPSULE | Freq: Two times a day (BID) | ORAL | 0 refills | Status: DC

## 2021-10-03 NOTE — ED Triage Notes (Signed)
Urinary frequency with burning on urination x 1 week.  Lower back pain

## 2021-10-04 NOTE — ED Provider Notes (Signed)
[place]    CSN: [id number] Arrival date & time: 10/03/21  4076      History   Chief Complaint No chief complaint on file.   HPI Jaime [name] is a 35 y.o. female.   Presenting today with 1 week history of lower back soreness, urinary frequency, dysuria.  Denies fever, chills, nausea, vomiting, hematuria, vaginal symptoms, bowel changes.  Not taking anything over-the-counter for symptoms.  States she has history of urinary tract infections that have felt similar.  Denies any chance of pregnancy, on implant for contraception.   Past Medical History:  Diagnosis Date   Anxiety    Asthma    Chronic abdominal pain    Chronic back pain    Cirrhosis ([place])    Cirrhosis ([place])    COPD (chronic obstructive pulmonary disease) ([employer])    Depression    Diabetes mellitus without complication ([place])    Diabetes mellitus, type II ([place])    Hepatitis C    Insomnia    Long-term current use of methadone for opiate dependence ([place])    Lupus ([place])    Migraine headache    Neuropathy    Nocturnal seizures ([place])    Peptic ulcer    Spleen enlarged     Patient Active Problem List   Diagnosis Date Noted   Rectal bleeding 07/28/2021   Esophageal dysphagia 07/28/2021   Melena 07/28/2021   Pain of upper abdomen 07/28/2021   Non-intractable vomiting 07/28/2021   Adjustment disorder with depressed mood 09/07/2020   Respiratory failure ([place]) 08/31/2020   Acute encephalopathy 08/31/2020   Hyponatremia 08/16/2020   Volume overload 08/14/2020   Dyspnea 07/29/2020   Nexplanon insertion 05/20/2020   Nexplanon removal 05/20/2020   Pressure injury of skin 01/29/2019   Uncontrolled diabetes mellitus 09/19/2018   Hyperglycemia 09/17/2018   Acute respiratory failure with hypoxia ([place]) 09/15/2018   Anxiety 09/15/2018   Chronic abdominal pain 09/15/2018   Diabetes mellitus without complication ([place]) 80/88/1103   Hepatitis C 09/15/2018   Peptic ulcer 09/15/2018   Long-term current use  of methadone for opiate dependence ([place]) 09/15/2018   Cirrhosis ([place]) 09/15/2018   Acute hypoxemic respiratory failure ([place]) 07/17/2016   Pleural effusion on right 07/17/2016   Multiple rib fractures involving four or more ribs 07/17/2016   Hemothorax on right 07/17/2016   Chronic pain disorder 07/17/2016    Past Surgical History:  Procedure Laterality Date   CHOLECYSTECTOMY     ESOPHAGOGASTRODUODENOSCOPY  06/2020   done at [facility], candida in upper esophagus (treated with diflucan), ulcerative esophagitis at GE junction, gastritis in stomach, single ulcer in duodenal bulb, with duodenal mucosa showing no abnormality. No presence of varices    OB History     Gravida  0   Para  0   Term  0   Preterm  0   AB  0   Living  0      SAB  0   IAB  0   Ectopic  0   Multiple  0   Live Births  0            Home Medications    Prior to Admission medications   Medication Sig Start Date End Date Taking? Authorizing Provider  cephALEXin (KEFLEX) 500 MG capsule Take 1 capsule (500 mg total) by mouth 2 (two) times daily. 10/03/21  Yes Volney American, PA-C  Accu-Chek Softclix Lancets lancets 4 (four) times daily. 07/07/20   [provider]  albuterol (ACCUNEB) 0.63  MG/3ML nebulizer solution Take by nebulization 4 (four) times daily. 07/27/20   [provider]  albuterol (PROVENTIL) (2.5 MG/3ML) 0.083% nebulizer solution Take 2.5 mg by nebulization every 6 (six) hours as needed for wheezing or shortness of breath.    [provider]  ARIPiprazole (ABILIFY) 5 MG tablet Take 5 mg by mouth daily. 06/20/21   [provider]  aspirin EC 81 MG tablet Take 162 mg by mouth daily. Swallow whole.    [provider]  cetirizine (ZYRTEC ALLERGY) 10 MG tablet Take 1 tablet (10 mg total) by mouth daily. 07/09/20   Avegno, Darrelyn Hillock, FNP  clonazePAM (KLONOPIN) 0.5 MG tablet Take 1 tablet (0.5 mg total) by mouth 2 (two) times daily as needed for  anxiety. 09/09/20 07/28/21  Shelly Coss, MD  Continuous Blood Gluc Sensor (DEXCOM G6 SENSOR) MISC 4 Pieces by Does not apply route once a week. 08/07/21   Brita Romp, NP  Continuous Blood Gluc Transmit (DEXCOM G6 TRANSMITTER) MISC 1 Piece by Does not apply route as directed. 02/05/21   Brita Romp, NP  cromolyn (OPTICROM) 4 % ophthalmic solution 1 drop 2 (two) times daily. 01/28/21   [provider]  etonogestrel (NEXPLANON) 68 MG IMPL implant 1 each by Subdermal route once.    [provider]  furosemide (LASIX) 40 MG tablet Take 80 mg by mouth daily.  05/22/20   [provider]  glipiZIDE (GLUCOTROL XL) 5 MG 24 hr tablet Take 1 tablet (5 mg total) by mouth daily with breakfast. 07/30/21   Brita Romp, NP  GLOBAL EASE INJECT PEN NEEDLES 31G X 8 MM MISC USE 1 PEN NEEDLE THREE TIMES DAILY. 06/30/21   Brita Romp, NP  glucose blood (ACCU-CHEK GUIDE) test strip Use as instructed to check blood glucose four times daily 05/20/21   Brita Romp, NP  hyoscyamine (ANASPAZ) 0.125 MG TBDP disintergrating tablet Place 1 tablet (0.125 mg total) under the tongue 3 (three) times daily as needed for bladder spasms or cramping (Patient Preference). 07/28/21   Carlan, Chelsea L, NP  insulin aspart (NOVOLOG) 100 UNIT/ML FlexPen Inject 30-36 Units into the skin 3 (three) times daily with meals. 09/18/21   Brita Romp, NP  insulin glargine, 2 Unit Dial, (TOUJEO MAX SOLOSTAR) 300 UNIT/ML Solostar Pen Inject 120 Units into the skin at bedtime. 08/13/21   Brita Romp, NP  Insulin Pen Needle (PEN NEEDLES) 32G X 6 MM MISC 1 each by Does not apply route 3 (three) times daily. 02/26/21   Brita Romp, NP  LACTULOSE PO Take by mouth. 10gm/15 ml - 30 mls  three times a day    [provider]  liraglutide (VICTOZA) 18 MG/3ML SOPN Inject 1.8 mg into the skin daily. 07/30/21   Brita Romp, NP  lisinopril (ZESTRIL) 5 MG tablet TAKE 1 TABLET ONCE  DAILY. 08/11/21   Brita Romp, NP  metFORMIN (GLUCOPHAGE) 1000 MG tablet Take 1 tablet (1,000 mg total) by mouth 2 (two) times daily with a meal. 07/30/21   Brita Romp, NP  methadone (DOLOPHINE) 10 MG/5ML solution Take 60 mg by mouth every morning. 15m    [provider]  Multiple Vitamin (MULTIVITAMIN) tablet Take 1 tablet by mouth daily.    [provider]  nystatin (MYCOSTATIN/NYSTOP) powder Apply 1 application topically 3 (three) times daily. 05/19/21   Mesner, JCorene Cornea MD  omeprazole (PRILOSEC) 40 MG capsule Take 1 capsule (40 mg total) by mouth daily.  07/28/21   Carlan, Chelsea L, NP  ondansetron (ZOFRAN-ODT) 4 MG disintegrating tablet Take 4 mg by mouth every 8 (eight) hours as needed for nausea.  06/14/20   [provider]  oxyCODONE (ROXICODONE) 5 MG immediate release tablet Take 1 tablet (5 mg total) by mouth every 12 (twelve) hours as needed for breakthrough pain or severe pain. 05/19/21   Mesner, Corene Cornea, MD  potassium chloride (KLOR-CON) 10 MEQ tablet Take 20 mEq by mouth daily.  04/24/20   [provider]  pregabalin (LYRICA) 75 MG capsule Take 75 mg by mouth in the morning, at noon, in the evening, and at bedtime.     [provider]  PROAIR HFA 108 [phone] Base) MCG/ACT inhaler SMARTSIG:2 Puff(s) By Mouth Every 6 Hours PRN 04/04/21   [provider]  propranolol (INDERAL) 10 MG tablet Take 10 mg by mouth 3 (three) times daily. 08/08/20   [provider]  RESTASIS 0.05 % ophthalmic emulsion Place 1 drop into both eyes as needed (dry eye).  04/22/19   [provider]  rosuvastatin (CRESTOR) 5 MG tablet Take 1 tablet (5 mg total) by mouth daily. 08/25/21   Brita Romp, NP  spironolactone (ALDACTONE) 100 MG tablet Take 100 mg by mouth daily.  05/22/20   [provider]  sucralfate (CARAFATE) 1 g tablet Take 1 g by mouth 4 (four) times daily. 07/02/20   [provider]  triamcinolone (KENALOG) 0.1  % Apply topically daily. 02/04/21   [provider]  venlafaxine XR (EFFEXOR-XR) 150 MG 24 hr capsule Take total of 225 mg daily (150 mg + 75 mg ) 01/24/21   Norman Clay, MD  venlafaxine XR (EFFEXOR-XR) 75 MG 24 hr capsule To take along with 140m for a total of 225 mg daily 01/20/21   HNorman Clay MD    Family History Family History  Problem Relation Age of Onset   Hypertension Mother    Hyperlipidemia Mother    Hyperlipidemia Maternal Grandfather     Social History Social History   Tobacco Use   Smoking status: Every Day    Types: E-cigarettes   Smokeless tobacco: Never  Vaping Use   Vaping Use: Every day   Substances: Nicotine, Flavoring  Substance Use Topics   Alcohol use: No   Drug use: No     Allergies   Iodine-131, Ivp dye [iodinated diagnostic agents], Ketorolac tromethamine, Tylenol [acetaminophen], Gabapentin, Morphine and related, Nsaids, Suboxone [buprenorphine hcl-naloxone hcl], and Vancomycin   Review of Systems Review of Systems Per HPI  Physical Exam Triage Vital Signs ED Triage Vitals [10/03/21 0812]  Enc Vitals Group     BP 114/75     Pulse Rate 96     Resp 18     Temp 98.3 F (36.8 C)     Temp Source Oral     SpO2 94 %     Weight      Height      Head Circumference      Peak Flow      Pain Score 9     Pain Loc      Pain Edu?      Excl. in [place]    No data found.  Updated Vital Signs BP 114/75 (BP Location: Right Arm)   Pulse 96   Temp 98.3 F (36.8 C) (Oral)   Resp 18   LMP  (LMP Unknown)   SpO2 94%   Visual Acuity Right Eye Distance:   Left Eye  Distance:   Bilateral Distance:    Right Eye Near:   Left Eye Near:    Bilateral Near:     Physical Exam Vitals and nursing note reviewed.  Constitutional:      Appearance: Normal appearance. She is not ill-appearing.  HENT:     Head: Atraumatic.  Eyes:     Extraocular Movements: Extraocular movements intact.     Conjunctiva/sclera: Conjunctivae normal.   Cardiovascular:     Rate and Rhythm: Normal rate and regular rhythm.     Heart sounds: Normal heart sounds.  Pulmonary:     Effort: Pulmonary effort is normal.     Breath sounds: Normal breath sounds.  Abdominal:     General: Bowel sounds are normal. There is no distension.     Palpations: Abdomen is soft.     Tenderness: There is no abdominal tenderness. There is no right CVA tenderness, left CVA tenderness or guarding.  Musculoskeletal:        General: Normal range of motion.     Cervical back: Normal range of motion and neck supple.  Skin:    General: Skin is warm and dry.  Neurological:     Mental Status: She is alert. Mental status is at baseline.  Psychiatric:        Mood and Affect: Mood normal.        Thought Content: Thought content normal.        Judgment: Judgment normal.     UC Treatments / Results  Labs (all labs ordered are listed, but only abnormal results are displayed) Labs Reviewed  POCT URINALYSIS DIP (MANUAL ENTRY) - Abnormal; Notable for the following components:      Result Value   Color, UA orange (*)    Clarity, UA cloudy (*)    Glucose, UA =250 (*)    Bilirubin, UA moderate (*)    Ketones, POC UA trace (5) (*)    Spec Grav, UA >=1.030 (*)    Blood, UA small (*)    Protein Ur, POC =100 (*)    Urobilinogen, UA >=8.0 (*)    Nitrite, UA Positive (*)    Leukocytes, UA Trace (*)    All other components within normal limits  URINE CULTURE    EKG   Radiology No results found.  Procedures Procedures (including critical care time)  Medications Ordered in UC Medications - No data to display  Initial Impression / Assessment and Plan / UC Course  I have reviewed the triage vital signs and the nursing notes.  Pertinent labs & imaging results that were available during my care of the patient were reviewed by me and considered in my medical decision making (see chart for details).     Will treat with Keflex while awaiting urine culture for  positive urinalysis, though results distorted due to recent Azo use.  Adjust as needed based on urine culture results.  Push fluids, discussed supportive home care and return precautions.  Final Clinical Impressions(s) / UC Diagnoses   Final diagnoses:  Abnormal urinalysis  Acute lower UTI   Discharge Instructions   None    ED Prescriptions     Medication Sig Dispense Auth. Provider   cephALEXin (KEFLEX) 500 MG capsule Take 1 capsule (500 mg total) by mouth 2 (two) times daily. 10 capsule Volney American, [place]      PDMP not reviewed this encounter.   Volney American, [place] 10/04/21 1658

## 2021-10-07 ENCOUNTER — Other Ambulatory Visit: Payer: Self-pay | Admitting: Nurse Practitioner

## 2021-10-07 DIAGNOSIS — E1165 Type 2 diabetes mellitus with hyperglycemia: Secondary | ICD-10-CM

## 2021-10-07 LAB — URINE CULTURE: Culture: >=100000 — AB

## 2021-10-21 ENCOUNTER — Ambulatory Visit (INDEPENDENT_AMBULATORY_CARE_PROVIDER_SITE_OTHER): Payer: Medicaid Other | Admitting: Gastroenterology

## 2021-10-22 ENCOUNTER — Encounter: Payer: Self-pay | Admitting: Nurse Practitioner

## 2021-10-22 ENCOUNTER — Other Ambulatory Visit: Payer: Self-pay

## 2021-10-22 ENCOUNTER — Ambulatory Visit (INDEPENDENT_AMBULATORY_CARE_PROVIDER_SITE_OTHER): Payer: Medicaid Other | Admitting: Nurse Practitioner

## 2021-10-22 VITALS — BP 126/70 | HR 86 | Ht 69.0 in | Wt 283.4 lb

## 2021-10-22 DIAGNOSIS — E1165 Type 2 diabetes mellitus with hyperglycemia: Secondary | ICD-10-CM | POA: Diagnosis not present

## 2021-10-22 DIAGNOSIS — Z91119 Patient's noncompliance with dietary regimen due to unspecified reason: Secondary | ICD-10-CM

## 2021-10-22 DIAGNOSIS — E782 Mixed hyperlipidemia: Secondary | ICD-10-CM

## 2021-10-22 NOTE — Progress Notes (Signed)
10/22/2021, 10:43 AM     Endocrinology Follow Up Visit  Subjective:    Patient ID: Jaime [name], female    DOB: 05-Aug-1986.  Jaime [name] is being seen in follow up for management of currently uncontrolled symptomatic diabetes requested by  Neale Burly, MD.   Past Medical History:  Diagnosis Date   Anxiety    Asthma    Chronic abdominal pain    Chronic back pain    Cirrhosis ([place])    Cirrhosis ([place])    COPD (chronic obstructive pulmonary disease) ([place])    Depression    Diabetes mellitus without complication ([place])    Diabetes mellitus, type II ([place])    Hepatitis C    Insomnia    Long-term current use of methadone for opiate dependence ([place])    Lupus ([place])    Migraine headache    Neuropathy    Nocturnal seizures ([place])    Peptic ulcer    Spleen enlarged     Past Surgical History:  Procedure Laterality Date   CHOLECYSTECTOMY     ESOPHAGOGASTRODUODENOSCOPY  06/2020   done at [facility], candida in upper esophagus (treated with diflucan), ulcerative esophagitis at GE junction, gastritis in stomach, single ulcer in duodenal bulb, with duodenal mucosa showing no abnormality. No presence of varices    Social History   Socioeconomic History   Marital status: Single    Spouse name: Not on file   Number of children: Not on file   Years of education: Not on file   Highest education level: Not on file  Occupational History   Not on file  Tobacco Use   Smoking status: Every Day    Types: E-cigarettes   Smokeless tobacco: Never  Vaping Use   Vaping Use: Every day   Substances: Nicotine, Flavoring  Substance and Sexual Activity   Alcohol use: No   Drug use: No   Sexual activity: Not Currently    Birth control/protection: Implant  Other Topics Concern   Not on file  Social History Narrative   Not on file   Social Determinants of Health   Financial Resource Strain: Not on file   Food Insecurity: Not on file  Transportation Needs: Not on file  Physical Activity: Not on file  Stress: Not on file  Social Connections: Not on file    Family History  Problem Relation Age of Onset   Hypertension Mother    Hyperlipidemia Mother    Hyperlipidemia Maternal Grandfather     Outpatient Encounter Medications as of 10/22/2021  Medication Sig   Accu-Chek Softclix Lancets lancets 4 (four) times daily.   albuterol (ACCUNEB) 0.63 MG/3ML nebulizer solution Take by nebulization 4 (four) times daily.   albuterol (PROVENTIL) (2.5 MG/3ML) 0.083% nebulizer solution Take 2.5 mg by nebulization every 6 (six) hours as needed for wheezing or shortness of breath.   ARIPiprazole (ABILIFY) 20 MG tablet Take 20 mg by mouth daily.   aspirin EC 81 MG tablet Take 162 mg by mouth daily. Swallow whole.   cephALEXin (KEFLEX) 500 MG capsule Take 1 capsule (500 mg total) by mouth 2 (two) times daily.  cetirizine (ZYRTEC ALLERGY) 10 MG tablet Take 1 tablet (10 mg total) by mouth daily.   Continuous Blood Gluc Sensor (DEXCOM G6 SENSOR) MISC 4 Pieces by Does not apply route once a week.   Continuous Blood Gluc Transmit (DEXCOM G6 TRANSMITTER) MISC 1 Piece by Does not apply route as directed.   cromolyn (OPTICROM) 4 % ophthalmic solution 1 drop 2 (two) times daily.   etonogestrel (NEXPLANON) 68 MG IMPL implant 1 each by Subdermal route once.   furosemide (LASIX) 40 MG tablet Take 80 mg by mouth daily.    glipiZIDE (GLUCOTROL XL) 5 MG 24 hr tablet Take 1 tablet (5 mg total) by mouth daily with breakfast.   GLOBAL EASE INJECT PEN NEEDLES 31G X 8 MM MISC USE 1 PEN NEEDLE THREE TIMES DAILY.   glucose blood (ACCU-CHEK GUIDE) test strip Use as instructed to check blood glucose four times daily   hyoscyamine (ANASPAZ) 0.125 MG TBDP disintergrating tablet Place 1 tablet (0.125 mg total) under the tongue 3 (three) times daily as needed for bladder spasms or cramping (Patient Preference).   insulin aspart  (NOVOLOG FLEXPEN) 100 UNIT/ML FlexPen Inject 30-36 Units into the skin 3 (three) times daily with meals.   insulin glargine, 2 Unit Dial, (TOUJEO MAX SOLOSTAR) 300 UNIT/ML Solostar Pen Inject 120 Units into the skin at bedtime.   Insulin Pen Needle (PEN NEEDLES) 32G X 6 MM MISC 1 each by Does not apply route 3 (three) times daily.   LACTULOSE PO Take by mouth. 10gm/15 ml - 30 mls  three times a day   liraglutide (VICTOZA) 18 MG/3ML SOPN Inject 1.8 mg into the skin daily.   lisinopril (ZESTRIL) 5 MG tablet TAKE 1 TABLET ONCE DAILY.   metFORMIN (GLUCOPHAGE) 1000 MG tablet Take 1 tablet (1,000 mg total) by mouth 2 (two) times daily with a meal.   methadone (DOLOPHINE) 10 MG/5ML solution Take 60 mg by mouth every morning. 59m   Multiple Vitamin (MULTIVITAMIN) tablet Take 1 tablet by mouth daily.   nystatin (MYCOSTATIN/NYSTOP) powder Apply 1 application topically 3 (three) times daily.   omeprazole (PRILOSEC) 40 MG capsule Take 1 capsule (40 mg total) by mouth daily.   ondansetron (ZOFRAN-ODT) 4 MG disintegrating tablet Take 4 mg by mouth every 8 (eight) hours as needed for nausea.    oxyCODONE (ROXICODONE) 5 MG immediate release tablet Take 1 tablet (5 mg total) by mouth every 12 (twelve) hours as needed for breakthrough pain or severe pain.   pantoprazole (PROTONIX) 40 MG tablet Take 40 mg by mouth 2 (two) times daily.   potassium chloride (KLOR-CON M) 10 MEQ tablet Take 10 mEq by mouth 2 (two) times daily.   pregabalin (LYRICA) 75 MG capsule Take 75 mg by mouth in the morning, at noon, in the evening, and at bedtime.    PROAIR HFA 108 (90 Base) MCG/ACT inhaler SMARTSIG:2 Puff(s) By Mouth Every 6 Hours PRN   propranolol (INDERAL) 10 MG tablet Take 10 mg by mouth 3 (three) times daily.   RESTASIS 0.05 % ophthalmic emulsion Place 1 drop into both eyes as needed (dry eye).    rosuvastatin (CRESTOR) 5 MG tablet Take 1 tablet (5 mg total) by mouth daily.   spironolactone (ALDACTONE) 100 MG tablet Take  100 mg by mouth daily.    triamcinolone (KENALOG) 0.1 % Apply topically daily.   venlafaxine XR (EFFEXOR-XR) 150 MG 24 hr capsule Take total of 225 mg daily (150 mg + 75 mg )   venlafaxine XR (EFFEXOR-XR)  75 MG 24 hr capsule To take along with 131m for a total of 225 mg daily   ARIPiprazole (ABILIFY) 5 MG tablet Take 5 mg by mouth daily. (Patient not taking: Reported on 10/22/2021)   clonazePAM (KLONOPIN) 0.5 MG tablet Take 1 tablet (0.5 mg total) by mouth 2 (two) times daily as needed for anxiety.   potassium chloride (KLOR-CON) 10 MEQ tablet Take 20 mEq by mouth daily.  (Patient not taking: Reported on 10/22/2021)   sucralfate (CARAFATE) 1 g tablet Take 1 g by mouth 4 (four) times daily. (Patient not taking: Reported on 10/22/2021)   No facility-administered encounter medications on file as of 10/22/2021.    ALLERGIES: Allergies  Allergen Reactions   Iodine-131 Anaphylaxis, Shortness Of Breath and Swelling   Ivp Dye [Iodinated Diagnostic Agents] Anaphylaxis    Skin gets very red, unable to walk    Ketorolac Tromethamine Shortness Of Breath   Tylenol [Acetaminophen] Other (See Comments)    Due to cirrhosis    Gabapentin    Morphine And Related     Pt says she is not allergic to Morphine 08/12/20   Nsaids Other (See Comments)    Flares ulcers   Suboxone [Buprenorphine Hcl-Naloxone Hcl]    Vancomycin Swelling    Facial swelling,     VACCINATION STATUS: Immunization History  Administered Date(s) Administered   Influenza,inj,Quad PF,6+ Mos 07/18/2016   Moderna Sars-Covid-2 Vaccination 03/07/2020, 04/04/2020   Pneumococcal Polysaccharide-23 07/18/2016, 08/01/2020    Diabetes She presents for her follow-up diabetic visit. She has type 2 diabetes mellitus. Onset time: She was diagnosed at approximate age of 89years. Her disease course has been worsening. There are no hypoglycemic associated symptoms. Pertinent negatives for hypoglycemia include no headaches,  nervousness/anxiousness or pallor. (Had symptoms of hypoglycemia when glucose dropped to 117.) Associated symptoms include blurred vision, fatigue, foot paresthesias, polydipsia and polyuria. Pertinent negatives for diabetes include no chest pain, no polyphagia and no weight loss. There are no hypoglycemic complications. (History of unresponsiveness requiring EMS and hospitalization- none recent) Symptoms are stable. Diabetic complications include heart disease and peripheral neuropathy. Risk factors for coronary artery disease include diabetes mellitus, obesity, sedentary lifestyle, dyslipidemia and hypertension. Current diabetic treatment includes insulin injections and oral agent (monotherapy) (and Victoza). She is compliant with treatment most of the time. Her weight is fluctuating minimally. She is following a low salt and generally unhealthy diet. When asked about meal planning, she reported none. She has had a previous visit with a dietitian. She participates in exercise intermittently. Her home blood glucose trend is increasing steadily. Her breakfast blood glucose range is generally >200 mg/dl. Her lunch blood glucose range is generally >200 mg/dl. Her dinner blood glucose range is generally >200 mg/dl. Her bedtime blood glucose range is generally >200 mg/dl. Her overall blood glucose range is >200 mg/dl. (She presents today, accompanied by her mother, with her CGM, no logs, showing drastically worse glycemic profile overall.  She was not due for another A1c today.  She assures me she is taking her insulin as prescribed.  She does not typically eat breakfast but still injects insulin which we talked about today as being risky.  She is clinically depressed, sits around all day, alone which is preventing her from managing her diabetes optimally.  Analysis of her CGM shows TIR 0%, TAR in very high range 100%.  Her readings are consistently in the 400s.  She does admit to still eating/drinking things she knows  she shouldn't.) An ACE inhibitor/angiotensin II  receptor blocker is being taken. She does not see a podiatrist.Eye exam is not current.  Hypertension This is a chronic problem. The current episode started more than 1 month ago. The problem is unchanged. The problem is controlled. Associated symptoms include blurred vision and peripheral edema. Pertinent negatives include no chest pain, headaches, palpitations or shortness of breath. There are no associated agents to hypertension. Risk factors for coronary artery disease include diabetes mellitus, dyslipidemia, obesity and sedentary lifestyle. Past treatments include diuretics and beta blockers. The current treatment provides no improvement. There are no compliance problems.     Review of systems  Constitutional: + Minimally fluctuating body weight,  current Body mass index is 41.85 kg/m. , + fatigue, no subjective hyperthermia, no subjective hypothermia Eyes: no blurry vision, no xerophthalmia ENT: no sore throat, no nodules palpated in throat, no dysphagia/odynophagia, no hoarseness Cardiovascular: no chest pain, no shortness of breath, no palpitations, + leg swelling Respiratory: no cough, no shortness of breath Gastrointestinal: no nausea/vomiting/diarrhea Genitourinary: + polyuria Musculoskeletal: no muscle/joint aches Skin: no rashes, no hyperemia Neurological: no tremors, no numbness, no tingling, no dizziness Psychiatric: no depression, no anxiety  Objective:     BP 126/70    Pulse 86    Ht 5' 9"  (1.753 m)    Wt 283 lb 6.4 oz (128.5 kg)    SpO2 95%    BMI 41.85 kg/m   Wt Readings from Last 3 Encounters:  10/22/21 283 lb 6.4 oz (128.5 kg)  09/18/21 283 lb 9.6 oz (128.6 kg)  09/17/21 290 lb (131.5 kg)    BP Readings from Last 3 Encounters:  10/22/21 126/70  10/03/21 114/75  09/18/21 140/72     Physical Exam- Limited  Constitutional:  Body mass index is 41.85 kg/m. , not in acute distress, normal state of mind, Eyes:   EOMI, no exophthalmos Neck: Supple Cardiovascular: RRR, no murmurs, rubs, or gallops, 1+ pitting edema to BLE Respiratory: Adequate breathing efforts, no crackles, rales, rhonchi, or wheezing Musculoskeletal: no gross deformities, strength intact in all four extremities, no gross restriction of joint movements Skin:  no rashes, no hyperemia Neurological: no tremor with outstretched hands   Diabetic Foot Exam - Simple   No data filed    CMP ( most recent) CMP     Component Value Date/Time   NA 132 (L) 09/17/2021 1433   NA 135 02/19/2021 0822   K 4.2 09/17/2021 1433   CL 98 09/17/2021 1433   CO2 23 09/17/2021 1433   GLUCOSE 519 (HH) 09/17/2021 1433   BUN 5 (L) 09/17/2021 1433   BUN 6 02/19/2021 0822   CREATININE 0.65 09/17/2021 1433   CALCIUM 8.7 (L) 09/17/2021 1433   PROT 7.0 09/14/2021 0629   PROT 7.0 02/19/2021 0822   ALBUMIN 3.1 (L) 09/14/2021 0629   ALBUMIN 3.6 (L) 02/19/2021 0822   AST 33 09/14/2021 0629   ALT 39 09/14/2021 0629   ALKPHOS 147 (H) 09/14/2021 0629   BILITOT 1.4 (H) 09/14/2021 0629   BILITOT 0.5 02/19/2021 0822   GFRNONAA >60 09/17/2021 1433   GFRAA >60 08/01/2020 0704     Diabetic Labs (most recent): Lab Results  Component Value Date   HGBA1C 11.4 (H) 07/29/2021   HGBA1C 10.3 (A) 05/28/2021   HGBA1C 8.9 (A) 02/26/2021     Lipid Panel ( most recent) Lipid Panel     Component Value Date/Time   CHOL 210 (H) 07/23/2020 0842   TRIG 86 08/12/2020 1023   HDL 60 07/23/2020 0842  CHOLHDL 3.5 07/23/2020 0842   LDLCALC 116 (H) 07/23/2020 0842   LABVLDL 34 07/23/2020 0842      Lab Results  Component Value Date   TSH 0.635 09/08/2020   TSH 0.993 07/30/2020   TSH 1.990 07/23/2020   TSH 1.690 08/22/2010   FREET4 0.72 (L) 07/23/2020   FREET4 1.11 08/22/2010      Assessment & Plan:   1) Type 2 diabetes mellitus with hyperglycemia, without long-term current use of insulin (HCC)  - Jaime [name] has currently uncontrolled symptomatic type  2 DM since  35 years of age.  She presents today, accompanied by her mother, with her CGM, no logs, showing drastically worse glycemic profile overall.  She was not due for another A1c today.  She assures me she is taking her insulin as prescribed.  She does not typically eat breakfast but still injects insulin which we talked about today as being risky.  She is clinically depressed, sits around all day, alone which is preventing her from managing her diabetes optimally.  Analysis of her CGM shows TIR 0%, TAR in very high range 100%.  Her readings are consistently in the 400s.  She does admit to still eating/drinking things she knows she shouldn't.  - I had a long discussion with her about the progressive nature of diabetes and the pathology behind its complications.  -her diabetes is complicated by obesity/sedentary life and she remains at a high risk for more acute and chronic complications which include CAD, CVA, CKD, retinopathy, and neuropathy. These are all discussed in detail with her.  - Nutritional counseling repeated at each appointment due to patients tendency to fall back in to old habits.  - The patient admits there is a room for improvement in their diet and drink choices. -  Suggestion is made for the patient to avoid simple carbohydrates from their diet including Cakes, Sweet Desserts / Pastries, Ice Cream, Soda (diet and regular), Sweet Tea, Candies, Chips, Cookies, Sweet Pastries, Store Bought Juices, Alcohol in Excess of 1-2 drinks a day, Artificial Sweeteners, Coffee Creamer, and "Sugar-free" Products. This will help patient to have stable blood glucose profile and potentially avoid unintended weight gain.   - I encouraged the patient to switch to unprocessed or minimally processed complex starch and increased protein intake (animal or plant source), fruits, and vegetables.   - Patient is advised to stick to a routine mealtimes to eat 3 meals a day and avoid unnecessary snacks (to  snack only to correct hypoglycemia).  - I have approached her with the following individualized plan to manage  her diabetes and patient agrees:   -She is advised to continue current medication regimen of Toujeo 120 units SQ nightly, Humalog 30-36 units TID with meals if glucose is above 70 and she is eating (Specific instructions on how to titrate insulin dosage based on glucose readings given to patient in writing), continue Victoza 1.8 mg SQ daily, Metformin 1000 mg po twice daily and Glipizide 5 mg XL daily with breakfast.  I encouraged her to get out of the house more, away from the TV where she tends to snack.  Her depression medications have recently been adjusted and her mom plans to cut back hours at work soon so that she can help [place] manage her diabetes and other comorbidities.  -She is encouraged to continue to monitor glucose 4 times daily (using her CGM), before meals and before bed and log on the clinic sheets provided.  They are instructed to  call the clinic if she has readings less than 70 or greater than 300 for 3 tests in a row.     - Specific targets for  A1c;  LDL, HDL,  and Triglycerides were discussed with the patient.  2) Blood Pressure /Hypertension: Her blood pressure is controlled to target.  She is advised to continue Lasix 80 mg po daily, continue Lisinopril 5 mg po daily, continue Propanolol 10 mg po TID, and Aldactone 100 mg o daily  3) Lipids/Hyperlipidemia:   Review of her recent lipid profile from 07/23/20 shows uncontrolled LDL at 116 and elevated triglycerides of 197.  She is advised to continue Crestor 5 mg po daily at bedtime.  Side effects and precautions discussed with her.  Will recheck lipid panel on subsequent visits.  4)  Weight/Diet:  Her Body mass index is 41.85 kg/m.  -   clearly complicating her diabetes care.  She has recently lost 8 lbs. she is  a candidate for modest weight loss. I discussed with her the fact that loss of 5 - 10% of her  current  body weight will have the most impact on her diabetes management.  Exercise, and detailed carbohydrates information provided  -  detailed on discharge instructions.  5) Chronic Care/Health Maintenance: -she is on ACE and statin medications and is encouraged to initiate and continue to follow up with Ophthalmology, Dentist,  Podiatrist at least yearly or according to recommendations, and advised to stay away from smoking. I have recommended yearly flu vaccine and pneumonia vaccine at least every 5 years; moderate intensity exercise for up to 150 minutes weekly; and  sleep for at least 7 hours a day.  - she is advised to maintain close follow up with Hasanaj, Samul Dada, MD for primary care needs, as well as her other providers for optimal and coordinated care.       I spent 44 minutes in the care of the patient today including review of labs from [place], Lipids, Thyroid Function, Hematology (current and previous including abstractions from other facilities); face-to-face time discussing  her blood glucose readings/logs, discussing hypoglycemia and hyperglycemia episodes and symptoms, medications doses, her options of short and long term treatment based on the latest standards of care / guidelines;  discussion about incorporating lifestyle medicine;  and documenting the encounter.    Please refer to Patient Instructions for Blood Glucose Monitoring and Insulin/Medications Dosing Guide"  in media tab for additional information. Please  also refer to " Patient Self Inventory" in the Media  tab for reviewed elements of pertinent patient history.  Remus Blake participated in the discussions, expressed understanding, and voiced agreement with the above plans.  All questions were answered to her satisfaction. she is encouraged to contact clinic should she have any questions or concerns prior to her return visit.    Follow up plan: - Return in about 2 weeks (around 11/05/2021) for Diabetes F/U, Bring meter and  logs.    Rayetta Pigg, [facility] [address] [place], [place] [address] Phone: [phone] Fax: [phone]   10/22/2021, 10:43 AM

## 2021-10-23 ENCOUNTER — Encounter (INDEPENDENT_AMBULATORY_CARE_PROVIDER_SITE_OTHER): Payer: Self-pay

## 2021-10-23 ENCOUNTER — Other Ambulatory Visit (INDEPENDENT_AMBULATORY_CARE_PROVIDER_SITE_OTHER): Payer: Self-pay

## 2021-10-23 ENCOUNTER — Ambulatory Visit (INDEPENDENT_AMBULATORY_CARE_PROVIDER_SITE_OTHER): Payer: Medicaid Other | Admitting: Gastroenterology

## 2021-10-23 ENCOUNTER — Encounter (INDEPENDENT_AMBULATORY_CARE_PROVIDER_SITE_OTHER): Payer: Self-pay | Admitting: Gastroenterology

## 2021-10-23 VITALS — BP 119/75 | HR 102 | Temp 97.7°F | Ht 69.0 in | Wt 283.8 lb

## 2021-10-23 DIAGNOSIS — R1319 Other dysphagia: Secondary | ICD-10-CM

## 2021-10-23 DIAGNOSIS — R112 Nausea with vomiting, unspecified: Secondary | ICD-10-CM

## 2021-10-23 DIAGNOSIS — K219 Gastro-esophageal reflux disease without esophagitis: Secondary | ICD-10-CM | POA: Diagnosis not present

## 2021-10-23 DIAGNOSIS — K7460 Unspecified cirrhosis of liver: Secondary | ICD-10-CM | POA: Diagnosis not present

## 2021-10-23 DIAGNOSIS — R197 Diarrhea, unspecified: Secondary | ICD-10-CM

## 2021-10-23 DIAGNOSIS — K625 Hemorrhage of anus and rectum: Secondary | ICD-10-CM

## 2021-10-23 MED ORDER — ONDANSETRON HCL 4 MG PO TABS: 4.0000 mg | ORAL_TABLET | Freq: Three times a day (TID) | ORAL | 1 refills | Status: DC | PRN

## 2021-10-23 MED ORDER — SUCRALFATE 1 GM/10ML PO SUSP: 1.0000 g | Freq: Three times a day (TID) | ORAL | 1 refills | Status: DC

## 2021-10-23 NOTE — Patient Instructions (Addendum)
We will get you scheduled for EGD and Colonoscopy, it is important to keep your blood sugars well managed for many different reasons, but, we also will not be able to proceed with these procedures if your blood sugars are too high again as they were in November.  Please continue your pantoprazole 32m twice a day I am sending carafate to take before meals and at bedtime to help with your acid reflux and discomfort in upper abdomen I have also sent a small amount of zofran for nausea, you can take this every 8 hours as needed. Please continue to avoid Please avoid NSAIDs (advil, aleve, naproxen, goody powder, ibuprofen) as these can be very hard on your GI tract, causing inflammation, ulcers and damage to the lining of your GI tract.  Avoid alcohol, trigger foods such as greasy, spicy, fried, fatty foods, tomato based foods, caffeine and chocolate as these can worsen your symptoms.  You can also take over the counter imodium for your diarrhea.  We need to update one of your liver related labs to ensure all of your cirrhosis management is up to date, please have this lab done as soon as you are able to.  Please keep all follow up appts so that we can continue with 6 month routine cirrhosis management.   Next appt with [place] January 16th with Dr. CJenetta Downer

## 2021-10-23 NOTE — Progress Notes (Signed)
Referring Provider: Neale Burly, MD Primary Care Physician:  Neale Burly, MD Primary GI Physician: Jenetta Downer  Chief Complaint  Patient presents with   Abdominal Pain    Having abdominal pain, nausea, vomiting, diarrhea, blood in stool. Was seen in ED back in November. States she is no better. Throat feels like it is tight when she eats and gets choked on food.    HPI:   Jaime [name] is a 35 y.o. female with past medical history of anxiety, asthma, cirrhosis, COPD, Depression, DM, Hep C, opiate dependence, previous IVDU, Lupus, PUD and chronic pain.  Patient presenting today for follow up of nausea, vomiting, diarrhea, blood in stools and abdominal pain. She was supposed to undergo colonoscopy and EGD in November, however, due to elevated blood sugar, procedures were cancelled. She was seen in the ED on 11/6 for ongoing epigastric pain, n/v. She was hyperglycemia at that time as well.  She was discharged with supportive measures,  subsequently returned to ED on 11/13 with similar complaints, and hyperglycemia with glucose 440 at that time, again without acute etiologic findings for her symptoms.  CT A/P w/o contrast 08/11/21 showed stable appearance of cirrhosis and stigmata of portal venous hypertension, splenomegaly, abdominal varices, reactive appearing lymphadenopathy in porta hepatis and mesentery, trace ascites, increased retained stool in redundant large bowel but no other abnormalities.   She reports that she is still having some upper abdominal pain with frequent acid reflux symptoms daily. She is taking pantoprazole 26m BID. She was started on omeprazole 445monce daily by me in September, however this was stopped and pantoprazole started by one of her other doctors, she is unsure which one. Does not feel that reflux symptoms are well controlled on pantoprazole nor were they on omeprazole 4068mnce daily. She reports that blood sugars have been in the 300s since she was  switched to a different mediation for her diabetes. Abdominal pain is constant, does not seem to be brought on by any precipitating factors or relieved by anything. She does not feel that anything improves symptoms, eating does not worsen her pain. She has ongoing nausea with vomiting occurring 2 times per week. She reports occasional coffee ground emesis anytime she vomits. Appetite is okay, weight has remained stable. She does continue to experience diarrhea, it varies day to day depending on what she eats, more loose, maybe 2-3 episodes per day. She occasionally has formed stools, she notices BRBPR every time she has a BM. Defecating does not improve abdominal pain.   She also endorses issues with dysphagia, and odynophagia, even with drinking liquids, she feels that her throat is tight and like the liquid or food wants to come back up. She does have a dry cough and hoarse voice as well. She is not taking any NSAIDs at this time and does not drink alcohol.   She is up to date on cirrhosis monitoring, with the exception of AFP tumor marker.  Last LFTs 09/14/21 AST 33 ALT39 Alk PHos 147, t bili 1.4, plt 58k INR 07/29/21 was 1.1  RUQ [place]/6/22 with hepatic cirrhosis and surgical changes of prior cholecystectomy without biliary ductal dilation, no liver lesions, portal vein patent.   Last Colonoscopy:never Last Endoscopy:06/2020 with candida esophagitis, treated with diflucan, ulcerative esophagitis at GE junction, gastritis in stomach, single ulcer in duodenal bulb, with duodenal mucosa showing no abnormality, no presence of varices.  Past Medical History:  Diagnosis Date   Anxiety    Asthma  Chronic abdominal pain    Chronic back pain    Cirrhosis (HCC)    Cirrhosis (HCC)    COPD (chronic obstructive pulmonary disease) ([place])    Depression    Diabetes mellitus without complication ([place])    Diabetes mellitus, type II ([place])    Hepatitis C    Insomnia    Long-term current use of methadone  for opiate dependence ([place])    Lupus ([place])    Migraine headache    Neuropathy    Nocturnal seizures (HCC)    Peptic ulcer    Spleen enlarged     Past Surgical History:  Procedure Laterality Date   CHOLECYSTECTOMY     ESOPHAGOGASTRODUODENOSCOPY  06/2020   done at [facility], candida in upper esophagus (treated with diflucan), ulcerative esophagitis at GE junction, gastritis in stomach, single ulcer in duodenal bulb, with duodenal mucosa showing no abnormality. No presence of varices    Current Outpatient Medications  Medication Sig Dispense Refill   Accu-Chek Softclix Lancets lancets 4 (four) times daily.     albuterol (ACCUNEB) 0.63 MG/3ML nebulizer solution Take by nebulization 4 (four) times daily.     albuterol (PROVENTIL) (2.5 MG/3ML) 0.083% nebulizer solution Take 2.5 mg by nebulization every 6 (six) hours as needed for wheezing or shortness of breath.     ARIPiprazole (ABILIFY) 20 MG tablet Take 20 mg by mouth daily.     aspirin EC 81 MG tablet Take 162 mg by mouth daily. Swallow whole.     cetirizine (ZYRTEC ALLERGY) 10 MG tablet Take 1 tablet (10 mg total) by mouth daily. 30 tablet 0   clonazePAM (KLONOPIN) 0.5 MG tablet Take 1 tablet (0.5 mg total) by mouth 2 (two) times daily as needed for anxiety. 60 tablet 2   Continuous Blood Gluc Sensor (DEXCOM G6 SENSOR) MISC 4 Pieces by Does not apply route once a week. 4 each 2   Continuous Blood Gluc Transmit (DEXCOM G6 TRANSMITTER) MISC 1 Piece by Does not apply route as directed. 1 each 1   cromolyn (OPTICROM) 4 % ophthalmic solution 1 drop 2 (two) times daily.     etonogestrel (NEXPLANON) 68 MG IMPL implant 1 each by Subdermal route once.     furosemide (LASIX) 40 MG tablet Take 80 mg by mouth daily.      glipiZIDE (GLUCOTROL XL) 5 MG 24 hr tablet Take 1 tablet (5 mg total) by mouth daily with breakfast. 90 tablet 3   GLOBAL EASE INJECT PEN NEEDLES 31G X 8 MM MISC USE 1 PEN NEEDLE THREE TIMES DAILY. 100 each 2   glucose blood  (ACCU-CHEK GUIDE) test strip Use as instructed to check blood glucose four times daily 150 each 5   hyoscyamine (ANASPAZ) 0.125 MG TBDP disintergrating tablet Place 1 tablet (0.125 mg total) under the tongue 3 (three) times daily as needed for bladder spasms or cramping (Patient Preference). 90 tablet 2   insulin aspart (NOVOLOG FLEXPEN) 100 UNIT/ML FlexPen Inject 30-36 Units into the skin 3 (three) times daily with meals. 30 mL 1   insulin glargine, 2 Unit Dial, (TOUJEO MAX SOLOSTAR) 300 UNIT/ML Solostar Pen Inject 120 Units into the skin at bedtime. 30 mL 3   Insulin Pen Needle (PEN NEEDLES) 32G X 6 MM MISC 1 each by Does not apply route 3 (three) times daily. 100 each 3   LACTULOSE PO Take by mouth. 10gm/15 ml - 30 mls  three times a day     liraglutide (VICTOZA) 18 MG/3ML SOPN Inject 1.8  mg into the skin daily. 21 mL 3   lisinopril (ZESTRIL) 5 MG tablet TAKE 1 TABLET ONCE DAILY. 90 tablet 0   metFORMIN (GLUCOPHAGE) 1000 MG tablet Take 1 tablet (1,000 mg total) by mouth 2 (two) times daily with a meal. 180 tablet 3   methadone (DOLOPHINE) 10 MG/5ML solution Take 60 mg by mouth every morning. 67m     Multiple Vitamin (MULTIVITAMIN) tablet Take 1 tablet by mouth daily.     nystatin (MYCOSTATIN/NYSTOP) powder Apply 1 application topically 3 (three) times daily. 15 g 0   omeprazole (PRILOSEC) 40 MG capsule Take 1 capsule (40 mg total) by mouth daily. 90 capsule 3   ondansetron (ZOFRAN-ODT) 4 MG disintegrating tablet Take 4 mg by mouth every 8 (eight) hours as needed for nausea.      pantoprazole (PROTONIX) 40 MG tablet Take 40 mg by mouth 2 (two) times daily.     potassium chloride (KLOR-CON M) 10 MEQ tablet Take 10 mEq by mouth 2 (two) times daily.     pregabalin (LYRICA) 75 MG capsule Take 75 mg by mouth in the morning, at noon, in the evening, and at bedtime.      PROAIR HFA 108 (90 Base) MCG/ACT inhaler SMARTSIG:2 Puff(s) By Mouth Every 6 Hours PRN     propranolol (INDERAL) 10 MG tablet Take 10  mg by mouth 3 (three) times daily.     RESTASIS 0.05 % ophthalmic emulsion Place 1 drop into both eyes as needed (dry eye).      rosuvastatin (CRESTOR) 5 MG tablet Take 1 tablet (5 mg total) by mouth daily. 90 tablet 3   spironolactone (ALDACTONE) 100 MG tablet Take 100 mg by mouth daily.      triamcinolone (KENALOG) 0.1 % Apply topically daily.     venlafaxine XR (EFFEXOR-XR) 150 MG 24 hr capsule Take total of 225 mg daily (150 mg + 75 mg ) 90 capsule 0   venlafaxine XR (EFFEXOR-XR) 75 MG 24 hr capsule To take along with 1595mfor a total of 225 mg daily 90 capsule 0   No current facility-administered medications for this visit.    Allergies as of 10/23/2021 - Review Complete 10/23/2021  Allergen Reaction Noted   Iodine-131 Anaphylaxis, Shortness Of Breath, and Swelling 05/01/2018   Ivp dye [iodinated diagnostic agents] Anaphylaxis 10/07/2013   Ketorolac tromethamine Shortness Of Breath 12/22/2010   Tylenol [acetaminophen] Other (See Comments) 01/23/2019   Gabapentin  05/28/2020   Morphine and related  05/28/2020   Nsaids Other (See Comments) 12/22/2010   Suboxone [buprenorphine hcl-naloxone hcl]  05/28/2020   Vancomycin Swelling 05/15/2018    Family History  Problem Relation Age of Onset   Hypertension Mother    Hyperlipidemia Mother    Hyperlipidemia Maternal Grandfather     Social History   Socioeconomic History   Marital status: Single    Spouse name: Not on file   Number of children: Not on file   Years of education: Not on file   Highest education level: Not on file  Occupational History   Not on file  Tobacco Use   Smoking status: Every Day    Types: E-cigarettes   Smokeless tobacco: Never  Vaping Use   Vaping Use: Every day   Substances: Nicotine, Flavoring  Substance and Sexual Activity   Alcohol use: No   Drug use: No   Sexual activity: Not Currently    Birth control/protection: Implant  Other Topics Concern   Not on file  Social History  Narrative    Not on file   Social Determinants of Health   Financial Resource Strain: Not on file  Food Insecurity: Not on file  Transportation Needs: Not on file  Physical Activity: Not on file  Stress: Not on file  Social Connections: Not on file   Review of systems General: negative for malaise, night sweats, fever, chills, weight loss Neck: Negative for lumps, goiter, pain and significant neck swelling Resp: Negative for cough, wheezing, dyspnea at rest CV: Negative for chest pain, leg swelling, palpitations, orthopnea GI: denies melena, constipation, odyonophagia, early satiety or unintentional weight loss. +rectal bleeding +nausea +vomiting +upper abd pain +reflux +dysphagia  MSK: Negative for joint pain or swelling, back pain, and muscle pain. Derm: Negative for itching or rash Psych: Denies depression, anxiety, memory loss, confusion. No homicidal or suicidal ideation.  Heme: Negative for prolonged bleeding, bruising easily, and swollen nodes. Endocrine: Negative for cold or heat intolerance, polyuria, polydipsia and goiter. Neuro: negative for tremor, gait imbalance, syncope and seizures. The remainder of the review of systems is noncontributory.  Physical Exam: BP 119/75 (BP Location: Right Arm, Patient Position: Sitting, Cuff Size: Large)    Pulse (!) 102    Temp 97.7 F (36.5 C) (Oral)    Ht 5' 9"  (1.753 m)    Wt 283 lb 12.8 oz (128.7 kg)    BMI 41.91 kg/m  General:   Alert and oriented. No distress noted. Pleasant and cooperative.  Head:  Normocephalic and atraumatic. Eyes:  Conjuctiva clear without scleral icterus. Mouth:  Oral mucosa pink and moist. Good dentition. No lesions. Heart: Normal rate and rhythm, s1 and s2 heart sounds present.  Lungs: Clear lung sounds in all lobes. Respirations equal and unlabored. Abdomen:  +BS, full but soft, non-tender and non-distended. No rebound or guarding. No HSM or masses noted. Derm: No palmar erythema or jaundice Msk:  Symmetrical  without gross deformities. Normal posture. Extremities:  Without edema. Neurologic:  Alert and  oriented x4 Psych:  Alert and cooperative. Normal mood and affect.  Invalid input(s): 6 MONTHS   ASSESSMENT: Jaime [name] is a 35 y.o. female presenting today for ongoing dysphagia, nausea, vomiting, abdominal pain, and rectal bleeding.   She continues to report upper abdominal pain, daily reflux and nausea with vomiting a few times per week. She has ongoing diarrhea 2-3 episodes per day with some BRB mixed in. She also endorses coffee ground emesis at times as well. We will continue on pantoprazole 28m BID as she has been on omeprazole in the past without symptom control, I will add carafate 1g QID and give her a small amount of zofran for her nausea to be taken Q8H. I suspect some of her upper GI symptoms are related to uncontrolled GERD, however, all of her GI symptoms are likely multifactorial. Ultimately she would benefit from endoscopic evaluation with EGD and Colonoscopy, however, given her ongoing hyperglycemia, Anesthesia will likely not allow [place] proceed with these procedures as she has already had them cancelled twice in the recent past for hyperglycemia. She tells me that her blood sugars are still in the 300s currently. She has no sob, dizziness or fatigue. Hemoglobin in November was stable at 14.7, weight is also stable. She has follow up with Dr. CJenetta Downerin January 2023, she is encouraged to work on blood sugar management, diet and weight loss and keep all follow up appointments. If she develops severe abdominal pain, worsening rectal bleeding, syncope or hematemesis she needs to proceed to  the ER.    PLAN:  1.continue PPI BID 2. Add carafate 1g with meals and before bed 3. Zofran 49m up to every 8 hours PRN 4. Needs EGD and Colonoscopy but this will need to be scheduled once blood sugars are under control 5. Need to make sure blood sugars are well managed, 6. Updated AFP tumor  marker 7. Work on diet and exercise for weight management 8. Keep all follow up appts with GI 9. Continue to avoid alcohol and NSAIDs  Follow Up: Scheduled for Nov 17, 2021  Malaya Cagley L. CAlver Sorrow MSN, APRN, AGNP-C Adult-Gerontology Nurse Practitioner [facility]

## 2021-10-24 LAB — AFP TUMOR MARKER: AFP-Tumor Marker: 5.1 ng/mL

## 2021-10-31 ENCOUNTER — Encounter (INDEPENDENT_AMBULATORY_CARE_PROVIDER_SITE_OTHER): Payer: Self-pay

## 2021-11-05 ENCOUNTER — Ambulatory Visit (INDEPENDENT_AMBULATORY_CARE_PROVIDER_SITE_OTHER): Payer: Medicaid Other | Admitting: Nurse Practitioner

## 2021-11-05 ENCOUNTER — Encounter: Payer: Self-pay | Admitting: Nurse Practitioner

## 2021-11-05 ENCOUNTER — Other Ambulatory Visit: Payer: Self-pay

## 2021-11-05 VITALS — BP 125/79 | HR 93 | Ht 69.0 in | Wt 277.8 lb

## 2021-11-05 DIAGNOSIS — Z91119 Patient's noncompliance with dietary regimen due to unspecified reason: Secondary | ICD-10-CM

## 2021-11-05 DIAGNOSIS — E1165 Type 2 diabetes mellitus with hyperglycemia: Secondary | ICD-10-CM | POA: Diagnosis not present

## 2021-11-05 DIAGNOSIS — E782 Mixed hyperlipidemia: Secondary | ICD-10-CM | POA: Diagnosis not present

## 2021-11-05 LAB — POCT GLYCOSYLATED HEMOGLOBIN (HGB A1C): HbA1c, POC (controlled diabetic range): 10.9 % — AB (ref 0.0–7.0)

## 2021-11-05 NOTE — Progress Notes (Signed)
11/05/2021, 11:31 AM     Endocrinology Follow Up Visit  Subjective:    Patient ID: Jaime [name], female    DOB: [birth year].  Jaime [name] is being seen in follow up for management of currently uncontrolled symptomatic diabetes requested by  Neale Burly, MD.   Past Medical History:  Diagnosis Date   Anxiety    Asthma    Chronic abdominal pain    Chronic back pain    Cirrhosis ([place])    Cirrhosis ([place])    COPD (chronic obstructive pulmonary disease) ([place])    Depression    Diabetes mellitus without complication ([place])    Diabetes mellitus, type II ([place])    Hepatitis C    Insomnia    Long-term current use of methadone for opiate dependence ([place])    Lupus ([place])    Migraine headache    Neuropathy    Nocturnal seizures ([place])    Peptic ulcer    Spleen enlarged     Past Surgical History:  Procedure Laterality Date   CHOLECYSTECTOMY     ESOPHAGOGASTRODUODENOSCOPY  06/2020   done at [facility], candida in upper esophagus (treated with diflucan), ulcerative esophagitis at GE junction, gastritis in stomach, single ulcer in duodenal bulb, with duodenal mucosa showing no abnormality. No presence of varices    Social History   Socioeconomic History   Marital status: Single    Spouse name: Not on file   Number of children: Not on file   Years of education: Not on file   Highest education level: Not on file  Occupational History   Not on file  Tobacco Use   Smoking status: Every Day    Types: E-cigarettes   Smokeless tobacco: Never  Vaping Use   Vaping Use: Every day   Substances: Nicotine, Flavoring  Substance and Sexual Activity   Alcohol use: No   Drug use: No   Sexual activity: Not Currently    Birth control/protection: Implant  Other Topics Concern   Not on file  Social History Narrative   Not on file   Social Determinants of Health   Financial Resource Strain: Not on file   Food Insecurity: Not on file  Transportation Needs: Not on file  Physical Activity: Not on file  Stress: Not on file  Social Connections: Not on file    Family History  Problem Relation Age of Onset   Hypertension Mother    Hyperlipidemia Mother    Hyperlipidemia Maternal Grandfather     Outpatient Encounter Medications as of 11/05/2021  Medication Sig   Accu-Chek Softclix Lancets lancets 4 (four) times daily.   albuterol (ACCUNEB) 0.63 MG/3ML nebulizer solution Take by nebulization 4 (four) times daily.   albuterol (PROVENTIL) (2.5 MG/3ML) 0.083% nebulizer solution Take 2.5 mg by nebulization every 6 (six) hours as needed for wheezing or shortness of breath.   ARIPiprazole (ABILIFY) 20 MG tablet Take 20 mg by mouth daily.   aspirin EC 81 MG tablet Take 162 mg by mouth daily. Swallow whole.   cetirizine (ZYRTEC ALLERGY) 10 MG tablet Take 1 tablet (10 mg total) by mouth daily.   Continuous  Blood Gluc Sensor (DEXCOM G6 SENSOR) MISC 4 Pieces by Does not apply route once a week.   Continuous Blood Gluc Transmit (DEXCOM G6 TRANSMITTER) MISC 1 Piece by Does not apply route as directed.   cromolyn (OPTICROM) 4 % ophthalmic solution 1 drop 2 (two) times daily.   etonogestrel (NEXPLANON) 68 MG IMPL implant 1 each by Subdermal route once.   furosemide (LASIX) 40 MG tablet Take 80 mg by mouth daily.    glipiZIDE (GLUCOTROL XL) 5 MG 24 hr tablet Take 1 tablet (5 mg total) by mouth daily with breakfast.   GLOBAL EASE INJECT PEN NEEDLES 31G X 8 MM MISC USE 1 PEN NEEDLE THREE TIMES DAILY.   glucose blood (ACCU-CHEK GUIDE) test strip Use as instructed to check blood glucose four times daily   hyoscyamine (ANASPAZ) 0.125 MG TBDP disintergrating tablet Place 1 tablet (0.125 mg total) under the tongue 3 (three) times daily as needed for bladder spasms or cramping (Patient Preference).   insulin aspart (NOVOLOG FLEXPEN) 100 UNIT/ML FlexPen Inject 30-36 Units into the skin 3 (three) times daily with meals.    insulin glargine, 2 Unit Dial, (TOUJEO MAX SOLOSTAR) 300 UNIT/ML Solostar Pen Inject 120 Units into the skin at bedtime.   Insulin Pen Needle (PEN NEEDLES) 32G X 6 MM MISC 1 each by Does not apply route 3 (three) times daily.   LACTULOSE PO Take by mouth. 10gm/15 ml - 30 mls  three times a day   liraglutide (VICTOZA) 18 MG/3ML SOPN Inject 1.8 mg into the skin daily.   lisinopril (ZESTRIL) 5 MG tablet TAKE 1 TABLET ONCE DAILY.   metFORMIN (GLUCOPHAGE) 1000 MG tablet Take 1 tablet (1,000 mg total) by mouth 2 (two) times daily with a meal.   methadone (DOLOPHINE) 10 MG/5ML solution Take 60 mg by mouth every morning. 58m   Multiple Vitamin (MULTIVITAMIN) tablet Take 1 tablet by mouth daily.   nystatin (MYCOSTATIN/NYSTOP) powder Apply 1 application topically 3 (three) times daily.   omeprazole (PRILOSEC) 40 MG capsule Take 1 capsule (40 mg total) by mouth daily.   ondansetron (ZOFRAN) 4 MG tablet Take 1 tablet (4 mg total) by mouth every 8 (eight) hours as needed for nausea or vomiting.   ondansetron (ZOFRAN-ODT) 4 MG disintegrating tablet Take 4 mg by mouth every 8 (eight) hours as needed for nausea.    pantoprazole (PROTONIX) 40 MG tablet Take 40 mg by mouth 2 (two) times daily.   potassium chloride (KLOR-CON M) 10 MEQ tablet Take 10 mEq by mouth 2 (two) times daily.   pregabalin (LYRICA) 75 MG capsule Take 75 mg by mouth in the morning, at noon, in the evening, and at bedtime.    PROAIR HFA 108 (90 Base) MCG/ACT inhaler SMARTSIG:2 Puff(s) By Mouth Every 6 Hours PRN   propranolol (INDERAL) 10 MG tablet Take 10 mg by mouth 3 (three) times daily.   RESTASIS 0.05 % ophthalmic emulsion Place 1 drop into both eyes as needed (dry eye).    rosuvastatin (CRESTOR) 5 MG tablet Take 1 tablet (5 mg total) by mouth daily.   spironolactone (ALDACTONE) 100 MG tablet Take 100 mg by mouth daily.    sucralfate (CARAFATE) 1 GM/10ML suspension Take 10 mLs (1 g total) by mouth 4 (four) times daily -  with meals and  at bedtime.   triamcinolone (KENALOG) 0.1 % Apply topically daily.   venlafaxine XR (EFFEXOR-XR) 150 MG 24 hr capsule Take total of 225 mg daily (150 mg + 75 mg )  venlafaxine XR (EFFEXOR-XR) 75 MG 24 hr capsule To take along with 160m for a total of 225 mg daily   clonazePAM (KLONOPIN) 0.5 MG tablet Take 1 tablet (0.5 mg total) by mouth 2 (two) times daily as needed for anxiety.   No facility-administered encounter medications on file as of 11/05/2021.    ALLERGIES: Allergies  Allergen Reactions   Iodine-131 Anaphylaxis, Shortness Of Breath and Swelling   Ivp Dye [Iodinated Contrast Media] Anaphylaxis    Skin gets very red, unable to walk    Ketorolac Tromethamine Shortness Of Breath   Tylenol [Acetaminophen] Other (See Comments)    Due to cirrhosis    Gabapentin    Morphine And Related     Pt says she is not allergic to Morphine 08/12/20   Nsaids Other (See Comments)    Flares ulcers   Suboxone [Buprenorphine Hcl-Naloxone Hcl]    Vancomycin Swelling    Facial swelling,     VACCINATION STATUS: Immunization History  Administered Date(s) Administered   Influenza,inj,Quad PF,6+ Mos 07/18/2016   Moderna Sars-Covid-2 Vaccination 03/07/2020, 04/04/2020   Pneumococcal Polysaccharide-23 07/18/2016, 08/01/2020    Diabetes She presents for her follow-up diabetic visit. She has type 2 diabetes mellitus. Onset time: She was diagnosed at approximate age of 89years. Her disease course has been stable. There are no hypoglycemic associated symptoms. Pertinent negatives for hypoglycemia include no headaches, nervousness/anxiousness or pallor. (Had symptoms of hypoglycemia when glucose dropped to 117.) Associated symptoms include blurred vision, fatigue, foot paresthesias, polydipsia and polyuria. Pertinent negatives for diabetes include no chest pain, no polyphagia and no weight loss. There are no hypoglycemic complications. (History of unresponsiveness requiring EMS and hospitalization- none  recent) Symptoms are stable. Diabetic complications include heart disease and peripheral neuropathy. Risk factors for coronary artery disease include diabetes mellitus, obesity, sedentary lifestyle, dyslipidemia and hypertension. Current diabetic treatment includes oral agent (dual therapy) and intensive insulin program (and Victoza). She is compliant with treatment most of the time. Her weight is fluctuating minimally. She is following a low salt and generally unhealthy diet. When asked about meal planning, she reported none. She has had a previous visit with a dietitian. She participates in exercise intermittently. Her home blood glucose trend is fluctuating minimally. Her breakfast blood glucose range is generally >200 mg/dl. Her lunch blood glucose range is generally >200 mg/dl. Her dinner blood glucose range is generally >200 mg/dl. Her bedtime blood glucose range is generally >200 mg/dl. Her overall blood glucose range is >200 mg/dl. (She presents today, accompanied by her mother, with her CGM, no logs, showing above target fasting and postprandial glycemic profile overall.  Her POCT A1c today is 10.9%, improving from last visit of 11.4%.  She says she has not been sitting in front of the TV snacking as much now.  Analysis of her CGM shows data up to 12/27 (says she thought it was working but when checked in the room today, it was reading no sensor).  Her TIR was < 1%, TAR 99%.) An ACE inhibitor/angiotensin II receptor blocker is being taken. She does not see a podiatrist.Eye exam is not current.  Hypertension This is a chronic problem. The current episode started more than 1 month ago. The problem is unchanged. The problem is controlled. Associated symptoms include blurred vision and peripheral edema. Pertinent negatives include no chest pain, headaches, palpitations or shortness of breath. There are no associated agents to hypertension. Risk factors for coronary artery disease include diabetes mellitus,  dyslipidemia, obesity and sedentary lifestyle.  Past treatments include diuretics and beta blockers. The current treatment provides no improvement. There are no compliance problems.     Review of systems  Constitutional: + Minimally fluctuating body weight,  current Body mass index is 41.02 kg/m. , + fatigue, no subjective hyperthermia, no subjective hypothermia Eyes: no blurry vision, no xerophthalmia ENT: no sore throat, no nodules palpated in throat, no dysphagia/odynophagia, no hoarseness Cardiovascular: no chest pain, no shortness of breath, no palpitations, + leg swelling Respiratory: no cough, no shortness of breath Gastrointestinal: no nausea/vomiting/diarrhea Genitourinary: + polyuria Musculoskeletal: no muscle/joint aches Skin: no rashes, no hyperemia Neurological: no tremors, no numbness, no tingling, no dizziness Psychiatric: no depression, no anxiety  Objective:     BP 125/79    Pulse 93    Ht 5' 9"  (1.753 m)    Wt 277 lb 12.8 oz (126 kg)    SpO2 96%    BMI 41.02 kg/m   Wt Readings from Last 3 Encounters:  11/05/21 277 lb 12.8 oz (126 kg)  10/23/21 283 lb 12.8 oz (128.7 kg)  10/22/21 283 lb 6.4 oz (128.5 kg)    BP Readings from Last 3 Encounters:  11/05/21 125/79  10/23/21 119/75  10/22/21 126/70     Physical Exam- Limited  Constitutional:  Body mass index is 41.02 kg/m. , not in acute distress, normal state of mind, Eyes:  EOMI, no exophthalmos Neck: Supple Cardiovascular: RRR, no murmurs, rubs, or gallops, 1+ pitting edema to BLE Respiratory: Adequate breathing efforts, no crackles, rales, rhonchi, or wheezing Musculoskeletal: no gross deformities, strength intact in all four extremities, no gross restriction of joint movements Skin:  no rashes, no hyperemia Neurological: no tremor with outstretched hands   Diabetic Foot Exam - Simple   No data filed    CMP ( most recent) CMP     Component Value Date/Time   NA 132 (L) 09/17/2021 1433   NA 135  02/19/2021 0822   K 4.2 09/17/2021 1433   CL 98 09/17/2021 1433   CO2 23 09/17/2021 1433   GLUCOSE 519 (HH) 09/17/2021 1433   BUN 5 (L) 09/17/2021 1433   BUN 6 02/19/2021 0822   CREATININE 0.65 09/17/2021 1433   CALCIUM 8.7 (L) 09/17/2021 1433   PROT 7.0 09/14/2021 0629   PROT 7.0 02/19/2021 0822   ALBUMIN 3.1 (L) 09/14/2021 0629   ALBUMIN 3.6 (L) 02/19/2021 0822   AST 33 09/14/2021 0629   ALT 39 09/14/2021 0629   ALKPHOS 147 (H) 09/14/2021 0629   BILITOT 1.4 (H) 09/14/2021 0629   BILITOT 0.5 02/19/2021 0822   GFRNONAA >60 09/17/2021 1433   GFRAA >60 08/01/2020 0704     Diabetic Labs (most recent): Lab Results  Component Value Date   HGBA1C 10.9 (A) 11/05/2021   HGBA1C 11.4 (H) 07/29/2021   HGBA1C 10.3 (A) 05/28/2021     Lipid Panel ( most recent) Lipid Panel     Component Value Date/Time   CHOL 210 (H) 07/23/2020 0842   TRIG 86 08/12/2020 1023   HDL 60 07/23/2020 0842   CHOLHDL 3.5 07/23/2020 0842   LDLCALC 116 (H) 07/23/2020 0842   LABVLDL 34 07/23/2020 0842      Lab Results  Component Value Date   TSH 0.635 09/08/2020   TSH 0.993 07/30/2020   TSH 1.990 07/23/2020   TSH 1.690 08/22/2010   FREET4 0.72 (L) 07/23/2020   FREET4 1.11 08/22/2010      Assessment & Plan:   1) Type 2 diabetes mellitus with hyperglycemia,  without long-term current use of insulin ([place])  - Jaime [name] has currently uncontrolled symptomatic type 2 DM since  36 years of age.  She presents today, accompanied by her mother, with her CGM, no logs, showing above target fasting and postprandial glycemic profile overall.  Her POCT A1c today is 10.9%, improving from last visit of 11.4%.  She says she has not been sitting in front of the TV snacking as much now.  Analysis of her CGM shows data up to 12/27 (says she thought it was working but when checked in the room today, it was reading no sensor).  Her TIR was < 1%, TAR 99%.  - I had a long discussion with her about the progressive  nature of diabetes and the pathology behind its complications.  -her diabetes is complicated by obesity/sedentary life and she remains at a high risk for more acute and chronic complications which include CAD, CVA, CKD, retinopathy, and neuropathy. These are all discussed in detail with her.  - Nutritional counseling repeated at each appointment due to patients tendency to fall back in to old habits.  - The patient admits there is a room for improvement in their diet and drink choices. -  Suggestion is made for the patient to avoid simple carbohydrates from their diet including Cakes, Sweet Desserts / Pastries, Ice Cream, Soda (diet and regular), Sweet Tea, Candies, Chips, Cookies, Sweet Pastries, Store Bought Juices, Alcohol in Excess of 1-2 drinks a day, Artificial Sweeteners, Coffee Creamer, and "Sugar-free" Products. This will help patient to have stable blood glucose profile and potentially avoid unintended weight gain.   - I encouraged the patient to switch to unprocessed or minimally processed complex starch and increased protein intake (animal or plant source), fruits, and vegetables.   - Patient is advised to stick to a routine mealtimes to eat 3 meals a day and avoid unnecessary snacks (to snack only to correct hypoglycemia).  - I have approached her with the following individualized plan to manage  her diabetes and patient agrees:   -Given no recent readings to review, no changes will be made to her medications today.  She is advised to continue current medication regimen of Toujeo 120 units SQ nightly, Humalog 30-36 units TID with meals if glucose is above 70 and she is eating (Specific instructions on how to titrate insulin dosage based on glucose readings given to patient in writing), continue Victoza 1.8 mg SQ daily, Metformin 1000 mg po twice daily and Glipizide 5 mg XL daily with breakfast.  I encouraged her to get out of the house more, away from the TV where she tends to snack.    -She is encouraged to continue to monitor glucose 4 times daily (using her CGM), before meals and before bed and log on the clinic sheets provided.  They are instructed to call the clinic if she has readings less than 70 or greater than 300 for 3 tests in a row.     - Specific targets for  A1c;  LDL, HDL,  and Triglycerides were discussed with the patient.  2) Blood Pressure /Hypertension: Her blood pressure is controlled to target.  She is advised to continue Lasix 80 mg po daily, continue Lisinopril 5 mg po daily, continue Propanolol 10 mg po TID, and Aldactone 100 mg o daily  3) Lipids/Hyperlipidemia:   Review of her recent lipid profile from 07/23/20 shows uncontrolled LDL at 116 and elevated triglycerides of 197.  She is advised to continue Crestor 5  mg po daily at bedtime.  Side effects and precautions discussed with her.  Will recheck lipid panel prior to next visit.  4)  Weight/Diet:  Her Body mass index is 41.02 kg/m.  -   clearly complicating her diabetes care.   she is  a candidate for modest weight loss. I discussed with her the fact that loss of 5 - 10% of her  current body weight will have the most impact on her diabetes management.  Exercise, and detailed carbohydrates information provided  -  detailed on discharge instructions.  5) Chronic Care/Health Maintenance: -she is on ACE and statin medications and is encouraged to initiate and continue to follow up with Ophthalmology, Dentist,  Podiatrist at least yearly or according to recommendations, and advised to stay away from smoking. I have recommended yearly flu vaccine and pneumonia vaccine at least every 5 years; moderate intensity exercise for up to 150 minutes weekly; and  sleep for at least 7 hours a day.  - she is advised to maintain close follow up with Hasanaj, Samul Dada, MD for primary care needs, as well as her other providers for optimal and coordinated care.      I spent 33 minutes in the care of the patient today  including review of labs from [place], Lipids, Thyroid Function, Hematology (current and previous including abstractions from other facilities); face-to-face time discussing  her blood glucose readings/logs, discussing hypoglycemia and hyperglycemia episodes and symptoms, medications doses, her options of short and long term treatment based on the latest standards of care / guidelines;  discussion about incorporating lifestyle medicine;  and documenting the encounter.    Please refer to Patient Instructions for Blood Glucose Monitoring and Insulin/Medications Dosing Guide"  in media tab for additional information. Please  also refer to " Patient Self Inventory" in the Media  tab for reviewed elements of pertinent patient history.  Remus Blake participated in the discussions, expressed understanding, and voiced agreement with the above plans.  All questions were answered to her satisfaction. she is encouraged to contact clinic should she have any questions or concerns prior to her return visit.    Follow up plan: - Return in about 1 month (around 12/06/2021) for Diabetes F/U, Previsit labs, Bring meter and logs.    Rayetta Pigg, [facility] [address] [place], [place] [address] Phone: [phone] Fax: [phone]   11/05/2021, 11:31 AM

## 2021-11-05 NOTE — Patient Instructions (Signed)
Advice for Weight Management  -For most of us the best way to lose weight is by diet management. Generally speaking, diet management means consuming less calories intentionally which over time brings about progressive weight loss.  This can be achieved more effectively by restricting carbohydrate consumption to the minimum possible.  So, it is critically important to know your numbers: how much calorie you are consuming and how much calorie you need. More importantly, our carbohydrates sources should be unprocessed or minimally processed complex starch food items.   Sometimes, it is important to balance nutrition by increasing protein intake (animal or plant source), fruits, and vegetables.  -Sticking to a routine mealtime to eat 3 meals a day and avoiding unnecessary snacks is shown to have a big role in weight control. Under normal circumstances, the only time we lose real weight is when we are hungry, so allow hunger to take place- hunger means no food between meal times, only water.  It is not advisable to starve.   -It is better to avoid simple carbohydrates including: Cakes, Sweet Desserts, Ice Cream, Soda (diet and regular), Sweet Tea, Candies, Chips, Cookies, Store Bought Juices, Alcohol in Excess of  1-2 drinks a day, Artificial Sweeteners, Doughnuts, Coffee Creamers, "Sugar-free" Products, etc, etc.  This is not a complete list.....    -Consulting with certified diabetes educators is proven to provide you with the most accurate and current information on diet.  Also, you may be  interested in discussing diet options/exchanges , we can schedule a visit with Jaime [name], RDN, CDE for individualized nutrition education.  -Exercise: If you are able: 30 -60 minutes a day ,4 days a week, or 150 minutes a week.  The longer the better.  Combine stretch, strength, and aerobic activities.  If you were told in the past that you have high risk for cardiovascular diseases, you may seek evaluation by  your heart doctor prior to initiating moderate to intense exercise programs.    

## 2021-11-08 ENCOUNTER — Other Ambulatory Visit: Payer: Self-pay

## 2021-11-08 ENCOUNTER — Emergency Department (HOSPITAL_COMMUNITY)
Admission: EM | Admit: 2021-11-08 | Discharge: 2021-11-08 | Disposition: A | Discharge status: Home / Self Care | Payer: Medicaid Other | Attending: Emergency Medicine | Admitting: Emergency Medicine

## 2021-11-08 ENCOUNTER — Emergency Department (HOSPITAL_COMMUNITY): Payer: Medicaid Other

## 2021-11-08 ENCOUNTER — Encounter (HOSPITAL_COMMUNITY): Payer: Self-pay | Admitting: *Deleted

## 2021-11-08 DIAGNOSIS — R3589 Other polyuria: Secondary | ICD-10-CM | POA: Diagnosis present

## 2021-11-08 DIAGNOSIS — E1165 Type 2 diabetes mellitus with hyperglycemia: Secondary | ICD-10-CM | POA: Diagnosis not present

## 2021-11-08 DIAGNOSIS — K429 Umbilical hernia without obstruction or gangrene: Secondary | ICD-10-CM | POA: Insufficient documentation

## 2021-11-08 DIAGNOSIS — Z7984 Long term (current) use of oral hypoglycemic drugs: Secondary | ICD-10-CM | POA: Insufficient documentation

## 2021-11-08 DIAGNOSIS — Z794 Long term (current) use of insulin: Secondary | ICD-10-CM | POA: Diagnosis not present

## 2021-11-08 DIAGNOSIS — Z7982 Long term (current) use of aspirin: Secondary | ICD-10-CM | POA: Insufficient documentation

## 2021-11-08 LAB — URINALYSIS, ROUTINE W REFLEX MICROSCOPIC
Bilirubin Urine: NEGATIVE
Glucose, UA: >=500 mg/dL — AB
Ketones, ur: NEGATIVE mg/dL
Nitrite: NEGATIVE
Protein, ur: NEGATIVE mg/dL
Specific Gravity, Urine: 1.038 — ABNORMAL HIGH (ref 1.005–1.030)
pH: 7 (ref 5.0–8.0)

## 2021-11-08 LAB — CBC WITH DIFFERENTIAL/PLATELET
Abs Immature Granulocytes: 0.02 10*3/uL (ref 0.00–0.07)
Basophils Absolute: 0 10*3/uL (ref 0.0–0.1)
Basophils Relative: 0 %
Eosinophils Absolute: 0.2 10*3/uL (ref 0.0–0.5)
Eosinophils Relative: 4 %
HCT: 40.6 % (ref 36.0–46.0)
Hemoglobin: 14.5 g/dL (ref 12.0–15.0)
Immature Granulocytes: 0 %
Lymphocytes Relative: 13 %
Lymphs Abs: 0.7 10*3/uL (ref 0.7–4.0)
MCH: 32.7 pg (ref 26.0–34.0)
MCHC: 35.7 g/dL (ref 30.0–36.0)
MCV: 91.4 fL (ref 80.0–100.0)
Monocytes Absolute: 0.5 10*3/uL (ref 0.1–1.0)
Monocytes Relative: 8 %
Neutro Abs: 4.2 10*3/uL (ref 1.7–7.7)
Neutrophils Relative %: 75 %
Platelets: 65 10*3/uL — ABNORMAL LOW (ref 150–400)
RBC: 4.44 MIL/uL (ref 3.87–5.11)
RDW: 14.1 % (ref 11.5–15.5)
WBC: 5.6 10*3/uL (ref 4.0–10.5)
nRBC: 0 % (ref 0.0–0.2)

## 2021-11-08 LAB — COMPREHENSIVE METABOLIC PANEL
ALT: 34 U/L (ref 0–44)
AST: 33 U/L (ref 15–41)
Albumin: 3.3 g/dL — ABNORMAL LOW (ref 3.5–5.0)
Alkaline Phosphatase: 119 U/L (ref 38–126)
Anion gap: 9 (ref 5–15)
BUN: 6 mg/dL (ref 6–20)
CO2: 25 mmol/L (ref 22–32)
Calcium: 8.3 mg/dL — ABNORMAL LOW (ref 8.9–10.3)
Chloride: 96 mmol/L — ABNORMAL LOW (ref 98–111)
Creatinine, Ser: 0.58 mg/dL (ref 0.44–1.00)
GFR, Estimated: >60 mL/min (ref >60)
Glucose, Bld: 422 mg/dL — ABNORMAL HIGH (ref 70–99)
Potassium: 3.7 mmol/L (ref 3.5–5.1)
Sodium: 130 mmol/L — ABNORMAL LOW (ref 135–145)
Total Bilirubin: 1.5 mg/dL — ABNORMAL HIGH (ref 0.3–1.2)
Total Protein: 7 g/dL (ref 6.5–8.1)

## 2021-11-08 LAB — CBG MONITORING, ED
Glucose-Capillary: 444 mg/dL — ABNORMAL HIGH (ref 70–99)
Glucose-Capillary: 290 mg/dL — ABNORMAL HIGH (ref 70–99)

## 2021-11-08 LAB — PREGNANCY, URINE: Preg Test, Ur: NEGATIVE

## 2021-11-08 LAB — LIPASE, BLOOD: Lipase: 23 U/L (ref 11–51)

## 2021-11-08 MED ORDER — SODIUM CHLORIDE 0.9 % IV BOLUS
1000.0000 mL | Freq: Once | INTRAVENOUS | Status: AC
  Administered 2021-11-08: 1000 mL via INTRAVENOUS

## 2021-11-08 MED ORDER — ONDANSETRON HCL 4 MG/2ML IJ SOLN
4.0000 mg | Freq: Once | INTRAMUSCULAR | Status: AC
Start: 2021-11-08 — End: 2021-11-08
  Administered 2021-11-08: 4 mg via INTRAVENOUS
  Filled 2021-11-08: qty 2

## 2021-11-08 MED ORDER — FLUCONAZOLE 150 MG PO TABS
150.0000 mg | ORAL_TABLET | Freq: Once | ORAL | Status: AC
  Administered 2021-11-08: 150 mg via ORAL
  Filled 2021-11-08: qty 1

## 2021-11-08 MED ORDER — OXYCODONE HCL 5 MG PO TABS
5.0000 mg | ORAL_TABLET | Freq: Once | ORAL | Status: AC
  Administered 2021-11-08: 5 mg via ORAL
  Filled 2021-11-08: qty 1

## 2021-11-08 MED ORDER — INSULIN ASPART 100 UNIT/ML IJ SOLN
12.0000 [IU] | Freq: Once | INTRAMUSCULAR | Status: AC
  Administered 2021-11-08: 12 [IU] via INTRAVENOUS
  Filled 2021-11-08: qty 1

## 2021-11-08 NOTE — Discharge Instructions (Signed)
Keep a close watch on your blood glucose levels and continue using your home diabetes medications including your sliding scale insulin.  Keep a close watch on your diet avoiding sugars and high carbohydrate foods to help improve your blood glucose levels.  As discussed you do have a small umbilical hernia which contains a small amount of fat within it which can be causing your pain but is not an emergent surgical problem.  However you will want to have this addressed by a general surgeon.  You have been given referral for this.  In the interim I recommend using ice packs to the site when you have an episode of pain, apply for about 15 to 20 minutes while lying on your back, applying gentle pressure to the site may help to relieve the immediate pain.  I do recommend you get seen immediately if you have this abdominal pain and it does not to get better with this treatment or you develop nausea vomiting and/or fevers with this pain.

## 2021-11-08 NOTE — ED Triage Notes (Signed)
Abdominal pain with nausea onset today

## 2021-11-10 ENCOUNTER — Other Ambulatory Visit: Payer: Self-pay

## 2021-11-10 ENCOUNTER — Emergency Department (HOSPITAL_COMMUNITY)
Admission: EM | Admit: 2021-11-10 | Discharge: 2021-11-10 | Disposition: A | Discharge status: Home / Self Care | Payer: Medicaid Other | Attending: Emergency Medicine | Admitting: Emergency Medicine

## 2021-11-10 ENCOUNTER — Other Ambulatory Visit: Payer: Self-pay | Admitting: Nurse Practitioner

## 2021-11-10 ENCOUNTER — Encounter (HOSPITAL_COMMUNITY): Payer: Self-pay | Admitting: Emergency Medicine

## 2021-11-10 ENCOUNTER — Other Ambulatory Visit (INDEPENDENT_AMBULATORY_CARE_PROVIDER_SITE_OTHER): Payer: Self-pay | Admitting: Gastroenterology

## 2021-11-10 DIAGNOSIS — R197 Diarrhea, unspecified: Secondary | ICD-10-CM | POA: Diagnosis not present

## 2021-11-10 DIAGNOSIS — Z7982 Long term (current) use of aspirin: Secondary | ICD-10-CM | POA: Insufficient documentation

## 2021-11-10 DIAGNOSIS — E119 Type 2 diabetes mellitus without complications: Secondary | ICD-10-CM | POA: Diagnosis not present

## 2021-11-10 DIAGNOSIS — Z7984 Long term (current) use of oral hypoglycemic drugs: Secondary | ICD-10-CM | POA: Diagnosis not present

## 2021-11-10 DIAGNOSIS — Z794 Long term (current) use of insulin: Secondary | ICD-10-CM | POA: Diagnosis not present

## 2021-11-10 DIAGNOSIS — J449 Chronic obstructive pulmonary disease, unspecified: Secondary | ICD-10-CM | POA: Diagnosis not present

## 2021-11-10 DIAGNOSIS — M545 Low back pain, unspecified: Secondary | ICD-10-CM | POA: Insufficient documentation

## 2021-11-10 DIAGNOSIS — R1010 Upper abdominal pain, unspecified: Secondary | ICD-10-CM

## 2021-11-10 DIAGNOSIS — R109 Unspecified abdominal pain: Secondary | ICD-10-CM | POA: Diagnosis present

## 2021-11-10 DIAGNOSIS — Z79899 Other long term (current) drug therapy: Secondary | ICD-10-CM | POA: Diagnosis not present

## 2021-11-10 DIAGNOSIS — R1033 Periumbilical pain: Secondary | ICD-10-CM | POA: Diagnosis not present

## 2021-11-10 DIAGNOSIS — R739 Hyperglycemia, unspecified: Secondary | ICD-10-CM

## 2021-11-10 DIAGNOSIS — D696 Thrombocytopenia, unspecified: Secondary | ICD-10-CM

## 2021-11-10 DIAGNOSIS — D72819 Decreased white blood cell count, unspecified: Secondary | ICD-10-CM

## 2021-11-10 DIAGNOSIS — G8929 Other chronic pain: Secondary | ICD-10-CM | POA: Insufficient documentation

## 2021-11-10 DIAGNOSIS — R112 Nausea with vomiting, unspecified: Secondary | ICD-10-CM | POA: Diagnosis not present

## 2021-11-10 LAB — CBC WITH DIFFERENTIAL/PLATELET
Abs Immature Granulocytes: 0.01 10*3/uL (ref 0.00–0.07)
Basophils Absolute: 0 10*3/uL (ref 0.0–0.1)
Basophils Relative: 0 %
Eosinophils Absolute: 0.1 10*3/uL (ref 0.0–0.5)
Eosinophils Relative: 5 %
HCT: 40.8 % (ref 36.0–46.0)
Hemoglobin: 13.9 g/dL (ref 12.0–15.0)
Immature Granulocytes: 0 %
Lymphocytes Relative: 21 %
Lymphs Abs: 0.6 10*3/uL — ABNORMAL LOW (ref 0.7–4.0)
MCH: 32.3 pg (ref 26.0–34.0)
MCHC: 34.1 g/dL (ref 30.0–36.0)
MCV: 94.7 fL (ref 80.0–100.0)
Monocytes Absolute: 0.3 10*3/uL (ref 0.1–1.0)
Monocytes Relative: 10 %
Neutro Abs: 1.9 10*3/uL (ref 1.7–7.7)
Neutrophils Relative %: 64 %
Platelets: 56 10*3/uL — ABNORMAL LOW (ref 150–400)
RBC: 4.31 MIL/uL (ref 3.87–5.11)
RDW: 14.2 % (ref 11.5–15.5)
WBC: 2.9 10*3/uL — ABNORMAL LOW (ref 4.0–10.5)
nRBC: 0 % (ref 0.0–0.2)

## 2021-11-10 LAB — COMPREHENSIVE METABOLIC PANEL
ALT: 34 U/L (ref 0–44)
AST: 36 U/L (ref 15–41)
Albumin: 3.1 g/dL — ABNORMAL LOW (ref 3.5–5.0)
Alkaline Phosphatase: 120 U/L (ref 38–126)
Anion gap: 8 (ref 5–15)
BUN: 6 mg/dL (ref 6–20)
CO2: 27 mmol/L (ref 22–32)
Calcium: 8.9 mg/dL (ref 8.9–10.3)
Chloride: 98 mmol/L (ref 98–111)
Creatinine, Ser: 0.59 mg/dL (ref 0.44–1.00)
GFR, Estimated: >60 mL/min (ref >60)
Glucose, Bld: 499 mg/dL — ABNORMAL HIGH (ref 70–99)
Potassium: 4.2 mmol/L (ref 3.5–5.1)
Sodium: 133 mmol/L — ABNORMAL LOW (ref 135–145)
Total Bilirubin: 1.3 mg/dL — ABNORMAL HIGH (ref 0.3–1.2)
Total Protein: 6.8 g/dL (ref 6.5–8.1)

## 2021-11-10 LAB — URINALYSIS, ROUTINE W REFLEX MICROSCOPIC
Bacteria, UA: NONE SEEN
Bilirubin Urine: NEGATIVE
Glucose, UA: >=500 mg/dL — AB
Hgb urine dipstick: NEGATIVE
Ketones, ur: NEGATIVE mg/dL
Leukocytes,Ua: NEGATIVE
Nitrite: NEGATIVE
Protein, ur: NEGATIVE mg/dL
Specific Gravity, Urine: 1.032 — ABNORMAL HIGH (ref 1.005–1.030)
pH: 7 (ref 5.0–8.0)

## 2021-11-10 LAB — LIPASE, BLOOD: Lipase: 27 U/L (ref 11–51)

## 2021-11-10 MED ORDER — INSULIN ASPART 100 UNIT/ML IJ SOLN
15.0000 [IU] | Freq: Once | INTRAMUSCULAR | Status: AC
  Administered 2021-11-10: 15 [IU] via INTRAVENOUS
  Filled 2021-11-10: qty 1

## 2021-11-10 MED ORDER — LOPERAMIDE HCL 2 MG PO CAPS
4.0000 mg | ORAL_CAPSULE | Freq: Once | ORAL | Status: AC
  Administered 2021-11-10: 4 mg via ORAL
  Filled 2021-11-10: qty 2

## 2021-11-10 MED ORDER — HYDROMORPHONE HCL 2 MG/ML IJ SOLN
2.0000 mg | Freq: Once | INTRAMUSCULAR | Status: AC
  Administered 2021-11-10: 2 mg via INTRAVENOUS
  Filled 2021-11-10: qty 1

## 2021-11-10 MED ORDER — ONDANSETRON HCL 4 MG/2ML IJ SOLN
4.0000 mg | Freq: Once | INTRAMUSCULAR | Status: AC
  Administered 2021-11-10: 4 mg via INTRAVENOUS
  Filled 2021-11-10: qty 2

## 2021-11-10 NOTE — ED Triage Notes (Signed)
Pt c/o chronic abd pain with nausea and vomiting. Pt seen for same 2 days ago. Pt advised to follow up with surgeon.

## 2021-11-10 NOTE — ED Provider Notes (Signed)
[facility] EMERGENCY DEPARTMENT Provider Note   CSN: [id number] Arrival date & time: 11/10/21  2440     History  Chief Complaint  Patient presents with   Abdominal Pain    Jaime [name] is a 36 y.o. female.  The history is provided by the patient.  Abdominal Pain She has history of diabetes, COPD, cirrhosis of the liver, peptic ulcer disease chronic back pain on maintenance methadone and comes in complaining of abdominal pain.  She states she was in the emergency department 2 days ago was told she had an umbilical hernia and was referred to general surgery, appointment pending.  Pain came back this afternoon with associated nausea, vomiting, diarrhea.  Pain does improve for a brief period following emesis, but is not affected by diarrhea.  Pain is in the mid abdomen and she rates it at 9/10.  There is occasional radiation to the back.  She denies fever, chills, sweats.  She has had problems with abdominal pain in the past, but states that this is different.   Home Medications Prior to Admission medications   Medication Sig Start Date End Date Taking? Authorizing Provider  Accu-Chek Softclix Lancets lancets 4 (four) times daily. 07/07/20   [provider]  albuterol (ACCUNEB) 0.63 MG/3ML nebulizer solution Take by nebulization 4 (four) times daily. 07/27/20   [provider]  albuterol (PROVENTIL) (2.5 MG/3ML) 0.083% nebulizer solution Take 2.5 mg by nebulization every 6 (six) hours as needed for wheezing or shortness of breath.    [provider]  ARIPiprazole (ABILIFY) 20 MG tablet Take 20 mg by mouth daily. 10/15/21   [provider]  aspirin EC 81 MG tablet Take 162 mg by mouth daily. Swallow whole.    [provider]  cetirizine (ZYRTEC ALLERGY) 10 MG tablet Take 1 tablet (10 mg total) by mouth daily. 07/09/20   Avegno, Darrelyn Hillock, FNP  clonazePAM (KLONOPIN) 0.5 MG tablet Take 1 tablet (0.5 mg total) by mouth 2 (two) times daily as needed for  anxiety. 09/09/20 10/23/21  Shelly Coss, MD  Continuous Blood Gluc Sensor (DEXCOM G6 SENSOR) MISC 4 Pieces by Does not apply route once a week. 08/07/21   Brita Romp, NP  Continuous Blood Gluc Transmit (DEXCOM G6 TRANSMITTER) MISC 1 Piece by Does not apply route as directed. 02/05/21   Brita Romp, NP  cromolyn (OPTICROM) 4 % ophthalmic solution 1 drop 2 (two) times daily. 01/28/21   [provider]  etonogestrel (NEXPLANON) 68 MG IMPL implant 1 each by Subdermal route once.    [provider]  furosemide (LASIX) 40 MG tablet Take 80 mg by mouth daily.  05/22/20   [provider]  glipiZIDE (GLUCOTROL XL) 5 MG 24 hr tablet Take 1 tablet (5 mg total) by mouth daily with breakfast. 07/30/21   Brita Romp, NP  GLOBAL EASE INJECT PEN NEEDLES 31G X 8 MM MISC USE 1 PEN NEEDLE THREE TIMES DAILY. 06/30/21   Brita Romp, NP  glucose blood (ACCU-CHEK GUIDE) test strip Use as instructed to check blood glucose four times daily 05/20/21   Brita Romp, NP  hyoscyamine (ANASPAZ) 0.125 MG TBDP disintergrating tablet Place 1 tablet (0.125 mg total) under the tongue 3 (three) times daily as needed for bladder spasms or cramping (Patient Preference). 07/28/21   Carlan, Chelsea L, NP  insulin aspart (NOVOLOG FLEXPEN) 100 UNIT/ML FlexPen Inject 30-36 Units into the skin 3 (three) times daily with meals. 10/08/21   Brita Romp,  NP  insulin glargine, 2 Unit Dial, (TOUJEO MAX SOLOSTAR) 300 UNIT/ML Solostar Pen Inject 120 Units into the skin at bedtime. 08/13/21   Brita Romp, NP  Insulin Pen Needle (PEN NEEDLES) 32G X 6 MM MISC 1 each by Does not apply route 3 (three) times daily. 02/26/21   Brita Romp, NP  LACTULOSE PO Take by mouth. 10gm/15 ml - 30 mls  three times a day    [provider]  liraglutide (VICTOZA) 18 MG/3ML SOPN Inject 1.8 mg into the skin daily. 07/30/21   Brita Romp, NP  lisinopril (ZESTRIL) 5 MG tablet TAKE 1  TABLET ONCE DAILY. 10/08/21   Brita Romp, NP  metFORMIN (GLUCOPHAGE) 1000 MG tablet Take 1 tablet (1,000 mg total) by mouth 2 (two) times daily with a meal. 07/30/21   Brita Romp, NP  methadone (DOLOPHINE) 10 MG/5ML solution Take 60 mg by mouth every morning. 75m    [provider]  Multiple Vitamin (MULTIVITAMIN) tablet Take 1 tablet by mouth daily.    [provider]  nystatin (MYCOSTATIN/NYSTOP) powder Apply 1 application topically 3 (three) times daily. 05/19/21   Mesner, JCorene Cornea MD  omeprazole (PRILOSEC) 40 MG capsule Take 1 capsule (40 mg total) by mouth daily. 07/28/21   Carlan, Chelsea L, NP  ondansetron (ZOFRAN) 4 MG tablet Take 1 tablet (4 mg total) by mouth every 8 (eight) hours as needed for nausea or vomiting. 10/23/21   Carlan, Chelsea L, NP  ondansetron (ZOFRAN-ODT) 4 MG disintegrating tablet Take 4 mg by mouth every 8 (eight) hours as needed for nausea.  06/14/20   [provider]  pantoprazole (PROTONIX) 40 MG tablet Take 40 mg by mouth 2 (two) times daily. 10/08/21   [provider]  potassium chloride (KLOR-CON M) 10 MEQ tablet Take 10 mEq by mouth 2 (two) times daily. 10/15/21   [provider]  pregabalin (LYRICA) 75 MG capsule Take 75 mg by mouth in the morning, at noon, in the evening, and at bedtime.     [provider]  PROAIR HFA 108 ([phone]) MCG/ACT inhaler SMARTSIG:2 Puff(s) By Mouth Every 6 Hours PRN 04/04/21   [provider]  propranolol (INDERAL) 10 MG tablet Take 10 mg by mouth 3 (three) times daily. 08/08/20   [provider]  RESTASIS 0.05 % ophthalmic emulsion Place 1 drop into both eyes as needed (dry eye).  04/22/19   [provider]  rosuvastatin (CRESTOR) 5 MG tablet Take 1 tablet (5 mg total) by mouth daily. 08/25/21   RBrita Romp NP  spironolactone (ALDACTONE) 100 MG tablet Take 100 mg by mouth daily.  05/22/20   [provider]  sucralfate (CARAFATE) 1  GM/10ML suspension Take 10 mLs (1 g total) by mouth 4 (four) times daily -  with meals and at bedtime. 10/23/21   Carlan, Chelsea L, NP  triamcinolone (KENALOG) 0.1 % Apply topically daily. 02/04/21   [provider]  venlafaxine XR (EFFEXOR-XR) 150 MG 24 hr capsule Take total of 225 mg daily (150 mg + 75 mg ) 01/24/21   HNorman Clay MD  venlafaxine XR (EFFEXOR-XR) 75 MG 24 hr capsule To take along with 1552mfor a total of 225 mg daily 01/20/21   HiNorman ClayMD      Allergies    Iodine-131, Ivp dye [iodinated contrast media], Ketorolac tromethamine, Tylenol [acetaminophen], Gabapentin, Morphine and related, Nsaids, Suboxone [buprenorphine hcl-naloxone hcl], and Vancomycin    Review of Systems  Review of Systems  Gastrointestinal:  Positive for abdominal pain.  All other systems reviewed and are negative.  Physical Exam Updated Vital Signs BP (!) 146/91 (BP Location: Left Wrist)    Pulse 87    Temp 98 F (36.7 C) (Oral)    Resp 20    Ht 5' 9"  (1.753 m)    Wt 129 kg    SpO2 97%    BMI 42.00 kg/m  Physical Exam Vitals and nursing note reviewed.  Morbidly obese 36 year old female, resting comfortably and in no acute distress. Vital signs are significant for elevated blood pressure. Oxygen saturation is 97%, which is normal. Head is normocephalic and atraumatic. PERRLA, EOMI. Oropharynx is clear. Neck is nontender and supple without adenopathy or JVD. Back is nontender and there is no CVA tenderness. Lungs are clear without rales, wheezes, or rhonchi. Chest is nontender. Heart has regular rate and rhythm without murmur. Abdomen is soft, flat, with mild periumbilical tenderness.  There is no rebound or guarding.  Ventral hernia is noted but no umbilical hernia is obvious.  Bowel sounds are active. Extremities have no cyanosis or edema, full range of motion is present. Skin is warm and dry without rash. Neurologic: Mental status is normal, cranial nerves are intact, moves all  extremities equally.  ED Results / Procedures / Treatments   Labs (all labs ordered are listed, but only abnormal results are displayed) Labs Reviewed  CBC WITH DIFFERENTIAL/PLATELET - Abnormal; Notable for the following components:      Result Value   WBC 2.9 (*)    Platelets 56 (*)    Lymphs Abs 0.6 (*)    All other components within normal limits  URINALYSIS, ROUTINE W REFLEX MICROSCOPIC - Abnormal; Notable for the following components:   Color, Urine STRAW (*)    Specific Gravity, Urine 1.032 (*)    Glucose, UA >=500 (*)    All other components within normal limits  COMPREHENSIVE METABOLIC PANEL  LIPASE, BLOOD    Radiology CT ABDOMEN PELVIS WO CONTRAST  Result Date: 11/08/2021 CLINICAL DATA:  Abdominal pain.  Nausea. EXAM: CT ABDOMEN AND PELVIS WITHOUT CONTRAST TECHNIQUE: Multidetector CT imaging of the abdomen and pelvis was performed following the standard protocol without IV contrast. COMPARISON:  CT 08/11/2021 FINDINGS: Lower chest: Dense airspace consolidation in the medial right lower lobe, new from prior exam. Small consolidation in the lingula is stable and likely represents atelectasis/scarring. Upper normal heart size. Hepatobiliary: Nodular hepatic contours consistent with cirrhosis. A cannulated umbilical vein. Clips in the gallbladder fossa postcholecystectomy. No biliary dilatation. Pancreas: Fatty atrophy.  No ductal dilatation or inflammation. Spleen: Chronic splenomegaly and splenorenal varices. The spleen spans 26 cm cranial caudal dimension. No focal splenic abnormality on this unenhanced exam. Adrenals/Urinary Tract: Normal adrenal glands. No hydronephrosis or renal calculi. No perinephric edema. No visualized focal renal lesion. Unremarkable urinary bladder. Stomach/Bowel: Small hiatal hernia. There may be small paraesophageal varices. Stomach is partially distended without gastric wall thickening. There is no small bowel obstruction or inflammation. The appendix is  not visualized, no evidence of appendicitis. Small volume of colonic stool without colonic wall thickening or inflammatory change. Vascular/Lymphatic: Normal caliber abdominal aorta. Portosystemic collaterals, not well assessed on this unenhanced exam. Edema in the root of the mesentery is similar. Prominent porta hepatis nodes are similar to prior, typically reactive. Reproductive: Atrophic appearing uterus.  No adnexal mass. Other: No ascites. Small fat containing umbilical hernia. No free air or focal fluid collection. Body wall edema,  primarily periumbilical. Musculoskeletal: There are no acute or suspicious osseous abnormalities. IMPRESSION: 1. No acute findings in the abdomen/pelvis. 2. Dense airspace consolidation in the medial right lower lobe, most consistent pneumonia. 3. 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 Portosystemic collaterals. 4. Small hiatal hernia. 5. Small fat containing umbilical hernia. Electronically Signed   By: Keith Rake M.D.   On: 11/08/2021 17:49    Procedures Procedures    Medications Ordered in ED Medications  ondansetron (ZOFRAN) injection 4 mg (has no administration in time range)  HYDROmorphone (DILAUDID) injection 2 mg (has no administration in time range)  loperamide (IMODIUM) capsule 4 mg (has no administration in time range)    ED Course/ Medical Decision Making/ A&P                           Medical Decision Making  Abdominal pain, vomiting, diarrhea.  Abdominal exam is benign, I have very low index of suspicion for bowel obstruction.  I have reviewed the records from her previous ED visit, and umbilical hernia is described as small and containing fat but no bowel.  I suspect that current symptoms are actually a viral gastroenteritis.  I count 19 prior CT scans of abdomen and pelvis in our system, no indication for imaging today.  We will give IV fluids, check screening labs, give hydromorphone for pain and  ondansetron for nausea.  CBC shows leukopenia and thrombocytopenia which are presumably secondary to her cirrhosis and are not significantly changed from prior.  Metabolic panel and lipase are pending.  Case is signed out to Dr. Alvino Chapel.        Final Clinical Impression(s) / ED Diagnoses Final diagnoses:  Upper abdominal pain  Chronic low back pain, unspecified back pain laterality, unspecified whether sciatica present  Leukopenia, unspecified type  Thrombocytopenia (HCC)  Nausea vomiting and diarrhea    Rx / DC Orders ED Discharge Orders     None         Delora Fuel, MD 79/15/05 0710

## 2021-11-10 NOTE — ED Notes (Signed)
Resting in bed. Family in room.

## 2021-11-10 NOTE — ED Provider Notes (Signed)
°  Physical Exam  BP (!) 149/83    Pulse 87    Temp 98 F (36.7 C) (Oral)    Resp 20    Ht 5' 9"  (1.753 m)    Wt 129 kg    SpO2 97%    BMI 42.00 kg/m   Physical Exam  Procedures  Procedures  ED Course / MDM    Medical Decision Making  Received patient in signout.  Acute on somewhat chronic abdominal pain.  Work-up reassuring.  Rather benign abdominal exam.  Lab work did show elevated CBG without DKA.  Recent CT scan done without obstruction.  Feeling better.  Insulin at been given.  Can monitor more closely at home.  Will discharge home.       Davonna Belling, MD 11/10/21 [phone]

## 2021-11-11 ENCOUNTER — Ambulatory Visit (INDEPENDENT_AMBULATORY_CARE_PROVIDER_SITE_OTHER): Payer: Medicaid Other | Admitting: Surgery

## 2021-11-11 ENCOUNTER — Encounter: Payer: Self-pay | Admitting: Surgery

## 2021-11-11 VITALS — BP 121/81 | HR 95 | Temp 98.4°F | Resp 18 | Ht 70.0 in | Wt 287.0 lb

## 2021-11-11 DIAGNOSIS — M6208 Separation of muscle (nontraumatic), other site: Secondary | ICD-10-CM

## 2021-11-11 DIAGNOSIS — R1013 Epigastric pain: Secondary | ICD-10-CM

## 2021-11-11 DIAGNOSIS — R112 Nausea with vomiting, unspecified: Secondary | ICD-10-CM | POA: Diagnosis not present

## 2021-11-11 LAB — URINE CULTURE: Culture: 40000 — AB

## 2021-11-11 NOTE — Progress Notes (Signed)
[facility] History and Physical  Reason for Referral:Umbilical Hernia Referring Physician: Stoney Bang, MD  Chief Complaint   New Patient (Initial Visit); Hernia     Jaime [name] is a 36 y.o. female.  HPI: She was referred to the general surgery clinic for a umbilical hernia noted on a CT abdomen and pelvis.  She was recently seen in the emergency department for epigastric abdominal pain, nausea, and vomiting.  She states that she has been having this epigastric abdominal pain for a while.  Nothing seems to make the pain better.  She also has intractable nausea and vomiting associated with it.  She was previously scheduled with Dr. Jenetta Downer for an EGD and colonoscopy, but these were canceled secondary to high blood sugars.  She does have a follow-up scheduled with him on 1/16.  Her last bowel movement was 2 days ago, and she was passing gas earlier today.  She confirms history of EGD in the past, though she does not remember what it demonstrated, and she states it was many years ago.  Her surgical history significant for laparoscopic cholecystectomy many years ago.  She has a past medical history significant for diabetes, cirrhosis, hypertension, and COPD.  She she denies cigarette smoking, but confirms vaping.  She states she vapes at least 20-30 times per day.  She denies alcohol and illicit drug use.  Past Medical History:  Diagnosis Date   Anxiety    Asthma    Chronic abdominal pain    Chronic back pain    Cirrhosis (HCC)    Cirrhosis (HCC)    COPD (chronic obstructive pulmonary disease) ([place])    Depression    Diabetes mellitus without complication ([place])    Diabetes mellitus, type II ([place])    Hepatitis C    Insomnia    Long-term current use of methadone for opiate dependence ([place])    Lupus ([place])    Migraine headache    Neuropathy    Nocturnal seizures (HCC)    Peptic ulcer    Spleen enlarged     Past Surgical History:  Procedure Laterality Date    CHOLECYSTECTOMY     ESOPHAGOGASTRODUODENOSCOPY  06/2020   done at [facility], candida in upper esophagus (treated with diflucan), ulcerative esophagitis at GE junction, gastritis in stomach, single ulcer in duodenal bulb, with duodenal mucosa showing no abnormality. No presence of varices    Family History  Problem Relation Age of Onset   Hypertension Mother    Hyperlipidemia Mother    Hyperlipidemia Maternal Grandfather     Social History   Tobacco Use   Smoking status: Every Day    Types: E-cigarettes   Smokeless tobacco: Never  Vaping Use   Vaping Use: Every day   Substances: Nicotine, Flavoring  Substance Use Topics   Alcohol use: No   Drug use: No    Medications: I have reviewed the patient's current medications. Allergies as of 11/11/2021       Reactions   Iodine-131 Anaphylaxis, Shortness Of Breath, Swelling   Ivp Dye [iodinated Contrast Media] Anaphylaxis   Skin gets very red, unable to walk   Ketorolac Tromethamine Shortness Of Breath   Tylenol [acetaminophen] Other (See Comments)   Due to cirrhosis   Gabapentin    Morphine And Related    Pt says she is not allergic to Morphine 08/12/20   Nsaids Other (See Comments)   Flares ulcers   Suboxone [buprenorphine Hcl-naloxone Hcl]    Vancomycin Swelling   Facial swelling,  Medication List        Accurate as of November 11, 2021  9:53 AM. If you have any questions, ask your nurse or doctor.          Accu-Chek Guide test strip Generic drug: glucose blood Use as instructed to check blood glucose four times daily   albuterol 0.63 MG/3ML nebulizer solution Commonly known as: ACCUNEB Take by nebulization 4 (four) times daily.   ProAir HFA 108 (90 Base) MCG/ACT inhaler Generic drug: albuterol Inhale 1-2 puffs into the lungs every 6 (six) hours as needed.   ARIPiprazole 20 MG tablet Commonly known as: ABILIFY Take 20 mg by mouth daily.   aspirin EC 81 MG tablet Take 162 mg by mouth daily.  Swallow whole.   cetirizine 10 MG tablet Commonly known as: ZyrTEC Allergy Take 1 tablet (10 mg total) by mouth daily.   clonazePAM 0.5 MG tablet Commonly known as: KlonoPIN Take 1 tablet (0.5 mg total) by mouth 2 (two) times daily as needed for anxiety.   cromolyn 4 % ophthalmic solution Commonly known as: OPTICROM Place 1 drop into both eyes 2 (two) times daily.   Dexcom G6 Sensor Misc 4 Pieces by Does not apply route once a week.   Dexcom G6 Transmitter Misc 1 Piece by Does not apply route as directed.   etonogestrel 68 MG Impl implant Commonly known as: NEXPLANON 1 each by Subdermal route once.   furosemide 40 MG tablet Commonly known as: LASIX Take 80 mg by mouth daily.   glipiZIDE 5 MG 24 hr tablet Commonly known as: GLUCOTROL XL Take 1 tablet (5 mg total) by mouth daily with breakfast.   hyoscyamine 0.125 MG Tbdp disintergrating tablet Commonly known as: ANASPAZ TAKE 1 TABLET UNDER THE TONGUE THREE TIMES A DAY AS NEEDED FOR BLADDER SPASMS OR CRAMPING.   lisinopril 5 MG tablet Commonly known as: ZESTRIL TAKE 1 TABLET ONCE DAILY.   metFORMIN 1000 MG tablet Commonly known as: GLUCOPHAGE Take 1 tablet (1,000 mg total) by mouth 2 (two) times daily with a meal.   methadone 10 MG/5ML solution Commonly known as: DOLOPHINE Take 60 mg by mouth every morning. 66m   multivitamin tablet Take 1 tablet by mouth daily.   NovoLOG FlexPen 100 UNIT/ML FlexPen Generic drug: insulin aspart Inject 30-36 Units into the skin 3 (three) times daily with meals.   nystatin powder Commonly known as: MYCOSTATIN/NYSTOP Apply 1 application topically 3 (three) times daily.   omeprazole 40 MG capsule Commonly known as: PRILOSEC Take 1 capsule (40 mg total) by mouth daily.   ondansetron 4 MG tablet Commonly known as: ZOFRAN TAKE 1 TABLET BY MOUTH EVERY 8 HOURS AS NEEDED FOR NAUSEA AND VOMITING.   pantoprazole 40 MG tablet Commonly known as: PROTONIX Take 40 mg by mouth 2  (two) times daily.   Pen Needles 32G X 6 MM Misc 1 each by Does not apply route 3 (three) times daily.   Global Ease Inject Pen Needles 31G X 8 MM Misc Generic drug: Insulin Pen Needle Use 1 pen needle 4 times daily for insulin.   potassium chloride 10 MEQ tablet Commonly known as: KLOR-CON M Take 10 mEq by mouth 2 (two) times daily.   pregabalin 75 MG capsule Commonly known as: LYRICA Take 75 mg by mouth in the morning, at noon, in the evening, and at bedtime.   propranolol 10 MG tablet Commonly known as: INDERAL Take 10 mg by mouth 3 (three) times daily.   Restasis 0.05 %  ophthalmic emulsion Generic drug: cycloSPORINE Place 1 drop into both eyes as needed (dry eye).   rosuvastatin 5 MG tablet Commonly known as: Crestor Take 1 tablet (5 mg total) by mouth daily.   spironolactone 100 MG tablet Commonly known as: ALDACTONE Take 100 mg by mouth daily.   sucralfate 1 GM/10ML suspension Commonly known as: CARAFATE Take 10 mLs (1 g total) by mouth 4 (four) times daily -  with meals and at bedtime.   Toujeo Max SoloStar 300 UNIT/ML Solostar Pen Generic drug: insulin glargine (2 Unit Dial) Inject 120 Units into the skin at bedtime.   triamcinolone cream 0.1 % Commonly known as: KENALOG Apply topically daily.   venlafaxine XR 75 MG 24 hr capsule Commonly known as: EFFEXOR-XR To take along with 185m for a total of 225 mg daily   venlafaxine XR 150 MG 24 hr capsule Commonly known as: EFFEXOR-XR Take total of 225 mg daily (150 mg + 75 mg )   Victoza 18 MG/3ML Sopn Generic drug: liraglutide Inject 1.8 mg into the skin daily.         ROS:  Constitutional: negative for chills, fevers, and headache Eyes: positive for blurred vision, negative for double vision, pain Respiratory: negative for cough, wheezing, and shortness of breath Cardiovascular: negative for chest pain and palpitations Gastrointestinal: positive for abdominal pain, nausea, vomiting, and  indigestion, negative for reflux symptoms Integument/breast: negative for dryness, rash, and boils Musculoskeletal:positive for back pain, negative for neck pain and stiff joints Neurological: positive for dizziness, negative for tremors and numbness Endocrine: positive for diabetic symptoms including blurry vision, increased fatigue, polydipsia, and excessive thirst  Blood pressure 121/81, pulse 95, temperature 98.4 F (36.9 C), temperature source Other (Comment), resp. rate 18, height 5' 10"  (1.778 m), weight 287 lb (130.2 kg), SpO2 94 %. Physical Exam Vitals reviewed.  Constitutional:      Appearance: Normal appearance.  HENT:     Head: Normocephalic and atraumatic.  Eyes:     Extraocular Movements: Extraocular movements intact.     Pupils: Pupils are equal, round, and reactive to light.  Cardiovascular:     Rate and Rhythm: Normal rate and regular rhythm.  Pulmonary:     Effort: Pulmonary effort is normal.     Breath sounds: Normal breath sounds.  Abdominal:     Comments: Soft, nondistended, no percussion tenderness, mild tenderness to palpation of epigastrium, small reducible umbilical hernia, diastases recti present, no rigidity, guarding, rebound tenderness  Musculoskeletal:        General: Normal range of motion.     Cervical back: Normal range of motion.  Skin:    General: Skin is warm and dry.  Neurological:     General: No focal deficit present.     Mental Status: She is alert and oriented to person, place, and time.  Psychiatric:        Mood and Affect: Mood normal.        Behavior: Behavior normal.    Results: Results for orders placed or performed during the hospital encounter of 11/10/21 (from the past 48 hour(s))  Comprehensive metabolic panel     Status: Abnormal   Collection Time: 11/10/21  5:30 AM  Result Value Ref Range   Sodium 133 (L) 135 - 145 mmol/L   Potassium 4.2 3.5 - 5.1 mmol/L   Chloride 98 98 - 111 mmol/L   CO2 27 22 - 32 mmol/L   Glucose,  Bld 499 (H) 70 - 99 mg/dL  Comment: Glucose reference range applies only to samples taken after fasting for at least 8 hours.   BUN 6 6 - 20 mg/dL   Creatinine, Ser 0.59 0.44 - 1.00 mg/dL   Calcium 8.9 8.9 - 10.3 mg/dL   Total Protein 6.8 6.5 - 8.1 g/dL   Albumin 3.1 (L) 3.5 - 5.0 g/dL   AST 36 15 - 41 U/L   ALT 34 0 - 44 U/L   Alkaline Phosphatase 120 38 - 126 U/L   Total Bilirubin 1.3 (H) 0.3 - 1.2 mg/dL   GFR, Estimated >60 >60 mL/min    Comment: (NOTE) Calculated using the CKD-EPI Creatinine Equation (2021)    Anion gap 8 5 - 15    Comment: Performed at [facility], [address] [place], [place] [address]  Lipase, blood     Status: None   Collection Time: 11/10/21  5:30 AM  Result Value Ref Range   Lipase 27 11 - 51 U/L    Comment: Performed at [facility], [address], [place], [place] [address]  CBC with Differential     Status: Abnormal   Collection Time: 11/10/21  5:30 AM  Result Value Ref Range   WBC 2.9 (L) 4.0 - 10.5 K/uL   RBC 4.31 3.87 - 5.11 MIL/uL   Hemoglobin 13.9 12.0 - 15.0 g/dL   HCT 40.8 36.0 - 46.0 %   MCV 94.7 80.0 - 100.0 fL   MCH 32.3 26.0 - 34.0 pg   MCHC 34.1 30.0 - 36.0 g/dL   RDW 14.2 11.5 - 15.5 %   Platelets 56 (L) 150 - 400 K/uL    Comment: Immature Platelet Fraction may be clinically indicated, consider ordering this additional test [id number]    nRBC 0.0 0.0 - 0.2 %   Neutrophils Relative % 64 %   Neutro Abs 1.9 1.7 - 7.7 K/uL   Lymphocytes Relative 21 %   Lymphs Abs 0.6 (L) 0.7 - 4.0 K/uL   Monocytes Relative 10 %   Monocytes Absolute 0.3 0.1 - 1.0 K/uL   Eosinophils Relative 5 %   Eosinophils Absolute 0.1 0.0 - 0.5 K/uL   Basophils Relative 0 %   Basophils Absolute 0.0 0.0 - 0.1 K/uL   Immature Granulocytes 0 %   Abs Immature Granulocytes 0.01 0.00 - 0.07 K/uL    Comment: Performed at [facility], [address], [place], [place] [address]  Urinalysis, Routine w reflex microscopic Urine, Clean Catch     Status: Abnormal    Collection Time: 11/10/21  5:35 AM  Result Value Ref Range   Color, Urine STRAW (A) YELLOW   APPearance CLEAR CLEAR   Specific Gravity, Urine 1.032 (H) 1.005 - 1.030   pH 7.0 5.0 - 8.0   Glucose, UA >=500 (A) NEGATIVE mg/dL   Hgb urine dipstick NEGATIVE NEGATIVE   Bilirubin Urine NEGATIVE NEGATIVE   Ketones, ur NEGATIVE NEGATIVE mg/dL   Protein, ur NEGATIVE NEGATIVE mg/dL   Nitrite NEGATIVE NEGATIVE   Leukocytes,Ua NEGATIVE NEGATIVE   RBC / HPF 0-5 0 - 5 RBC/hpf   WBC, UA 0-5 0 - 5 WBC/hpf   Bacteria, UA NONE SEEN NONE SEEN   Squamous Epithelial / LPF 0-5 0 - 5    Comment: Performed at [facility], [address], [place], [place] [address]    Imaging: CT ABDOMEN AND PELVIS WITHOUT CONTRAST (11/08/21): IMPRESSION: 1. No acute findings in the abdomen/pelvis. 2. Dense airspace consolidation in the medial right lower lobe, most consistent pneumonia.  3. 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 Portosystemic collaterals. 4. Small hiatal hernia. 5. Small fat containing umbilical hernia.  Assessment & Plan:  Jaime [name] is a 36 y.o. female who presents for evaluation of umbilical hernia.  She currently complains of epigastric abdominal pain, nausea, and vomiting.  -Patient's symptoms are likely not related to her small umbilical hernia -Discussed with patient the need for her to follow-up at her scheduled GI appointment because I believe she needs an EGD and colonoscopy to further evaluate her symptoms -Umbilical hernia evaluated on imaging - small fat filled hernia, no evidence of bowel contained within hernia -Discussed that while patient does have a small umbilical hernia, would recommend further work-up of her epigastric pain, nausea, and vomiting prior to repair of this hernia.  Also explained that prior to umbilical hernia repair, would recommend attempts at weight loss and better control of her sugars to allow for appropriate healing  postoperatively -Gave patient strict return precautions if she develops erythema at her umbilicus, significant tenderness, bulge that is unable to be reduced, uncontrollable nausea and vomiting with obstipation -Provided patient with information for diastases recti and umbilical hernia -Follow up with me after evaluation by GI and improvement in her current symptoms  All questions were answered to the satisfaction of the patient and family.   Graciella Freer, DO [facility] [address] [place], [place] [address] [phone] (office)

## 2021-11-11 NOTE — ED Provider Notes (Signed)
[facility] EMERGENCY DEPARTMENT Provider Note   CSN: [id number] Arrival date & time: 11/08/21  1359     History  Chief Complaint  Patient presents with   Abdominal Pain   Hyperglycemia    Jaime [name] is a 36 y.o. female with a history including Type 2 DM, non etoh cirrhosis, Hep C, GERD, chronic abdominal pain on methadone , surgical hx significant for cholecystectomy, presenting with complaint of uncontrolled blood glucose (cbg 444 on arrival) along with nausea without emesis and periumbilical and epigastric abdominal pain.  She describes nausea without emesis and has had polyuria and polydipsia.  denies fever, chills, diarrhea, dysuria, cough, chest pain or sob.  Denies diarrhea or constipation.  She takes prilosec daily along with carafate, feels her GERD is well controlled. She last took her novolog around 10 am and took 30 units.  The history is provided by the patient.      Home Medications Prior to Admission medications   Medication Sig Start Date End Date Taking? Authorizing Provider  albuterol (ACCUNEB) 0.63 MG/3ML nebulizer solution Take by nebulization 4 (four) times daily. 07/27/20   [provider]  ARIPiprazole (ABILIFY) 20 MG tablet Take 20 mg by mouth daily. 10/15/21   [provider]  aspirin EC 81 MG tablet Take 162 mg by mouth daily. Swallow whole.    [provider]  cetirizine (ZYRTEC ALLERGY) 10 MG tablet Take 1 tablet (10 mg total) by mouth daily. 07/09/20   Avegno, Darrelyn Hillock, FNP  clonazePAM (KLONOPIN) 0.5 MG tablet Take 1 tablet (0.5 mg total) by mouth 2 (two) times daily as needed for anxiety. 09/09/20 11/10/21  Shelly Coss, MD  Continuous Blood Gluc Sensor (DEXCOM G6 SENSOR) MISC 4 Pieces by Does not apply route once a week. 08/07/21   Brita Romp, NP  Continuous Blood Gluc Transmit (DEXCOM G6 TRANSMITTER) MISC 1 Piece by Does not apply route as directed. 02/05/21   Brita Romp, NP  cromolyn (OPTICROM) 4 % ophthalmic  solution Place 1 drop into both eyes 2 (two) times daily. 01/28/21   [provider]  etonogestrel (NEXPLANON) 68 MG IMPL implant 1 each by Subdermal route once.    [provider]  furosemide (LASIX) 40 MG tablet Take 80 mg by mouth daily.  05/22/20   [provider]  glipiZIDE (GLUCOTROL XL) 5 MG 24 hr tablet Take 1 tablet (5 mg total) by mouth daily with breakfast. 07/30/21   Brita Romp, NP  glucose blood (ACCU-CHEK GUIDE) test strip Use as instructed to check blood glucose four times daily 05/20/21   Brita Romp, NP  hyoscyamine (ANASPAZ) 0.125 MG TBDP disintergrating tablet TAKE 1 TABLET UNDER THE TONGUE THREE TIMES A DAY AS NEEDED FOR BLADDER SPASMS OR CRAMPING. 11/11/21   Harvel Quale, MD  insulin aspart (NOVOLOG FLEXPEN) 100 UNIT/ML FlexPen Inject 30-36 Units into the skin 3 (three) times daily with meals. 10/08/21   Brita Romp, NP  insulin glargine, 2 Unit Dial, (TOUJEO MAX SOLOSTAR) 300 UNIT/ML Solostar Pen Inject 120 Units into the skin at bedtime. 08/13/21   Brita Romp, NP  Insulin Pen Needle (GLOBAL EASE INJECT PEN NEEDLES) 31G X 8 MM MISC Use 1 pen needle 4 times daily for insulin. 11/10/21   Brita Romp, NP  Insulin Pen Needle (PEN NEEDLES) 32G X 6 MM MISC 1 each by Does not apply route 3 (three) times daily. 02/26/21   Brita Romp, NP  liraglutide (VICTOZA) 18 MG/3ML  SOPN Inject 1.8 mg into the skin daily. 07/30/21   Brita Romp, NP  lisinopril (ZESTRIL) 5 MG tablet TAKE 1 TABLET ONCE DAILY. 10/08/21   Brita Romp, NP  metFORMIN (GLUCOPHAGE) 1000 MG tablet Take 1 tablet (1,000 mg total) by mouth 2 (two) times daily with a meal. 07/30/21   Brita Romp, NP  methadone (DOLOPHINE) 10 MG/5ML solution Take 60 mg by mouth every morning. 13m    [provider]  Multiple Vitamin (MULTIVITAMIN) tablet Take 1 tablet by mouth daily.    [provider]  nystatin (MYCOSTATIN/NYSTOP)  powder Apply 1 application topically 3 (three) times daily. 05/19/21   Mesner, JCorene Cornea MD  omeprazole (PRILOSEC) 40 MG capsule Take 1 capsule (40 mg total) by mouth daily. 07/28/21   Carlan, Chelsea L, NP  ondansetron (ZOFRAN) 4 MG tablet TAKE 1 TABLET BY MOUTH EVERY 8 HOURS AS NEEDED FOR NAUSEA AND VOMITING. 11/11/21   CHarvel Quale MD  pantoprazole (PROTONIX) 40 MG tablet Take 40 mg by mouth 2 (two) times daily. 10/08/21   [provider]  potassium chloride (KLOR-CON M) 10 MEQ tablet Take 10 mEq by mouth 2 (two) times daily. 10/15/21   [provider]  pregabalin (LYRICA) 75 MG capsule Take 75 mg by mouth in the morning, at noon, in the evening, and at bedtime.     [provider]  PROAIR HFA 108 ([phone]) MCG/ACT inhaler Inhale 1-2 puffs into the lungs every 6 (six) hours as needed. 04/04/21   [provider]  propranolol (INDERAL) 10 MG tablet Take 10 mg by mouth 3 (three) times daily. 08/08/20   [provider]  RESTASIS 0.05 % ophthalmic emulsion Place 1 drop into both eyes as needed (dry eye).  04/22/19   [provider]  rosuvastatin (CRESTOR) 5 MG tablet Take 1 tablet (5 mg total) by mouth daily. 08/25/21   RBrita Romp NP  spironolactone (ALDACTONE) 100 MG tablet Take 100 mg by mouth daily.  05/22/20   [provider]  sucralfate (CARAFATE) 1 GM/10ML suspension Take 10 mLs (1 g total) by mouth 4 (four) times daily -  with meals and at bedtime. 10/23/21   Carlan, Chelsea L, NP  triamcinolone (KENALOG) 0.1 % Apply topically daily. 02/04/21   [provider]  venlafaxine XR (EFFEXOR-XR) 150 MG 24 hr capsule Take total of 225 mg daily (150 mg + 75 mg ) 01/24/21   HNorman Clay MD  venlafaxine XR (EFFEXOR-XR) 75 MG 24 hr capsule To take along with 1514mfor a total of 225 mg daily 01/20/21   HiNorman ClayMD      Allergies    Iodine-131, Ivp dye [iodinated contrast media], Ketorolac tromethamine, Tylenol  [acetaminophen], Gabapentin, Morphine and related, Nsaids, Suboxone [buprenorphine hcl-naloxone hcl], and Vancomycin    Review of Systems   Review of Systems  Constitutional:  Negative for chills, diaphoresis and fever.  HENT:  Negative for congestion and sore throat.   Eyes: Negative.   Respiratory:  Negative for chest tightness and shortness of breath.   Cardiovascular:  Negative for chest pain.  Gastrointestinal:  Positive for abdominal pain and nausea. Negative for vomiting.  Endocrine: Positive for polydipsia and polyuria.  Genitourinary: Negative.   Musculoskeletal:  Negative for arthralgias, joint swelling and neck pain.  Skin: Negative.  Negative for rash and wound.  Neurological:  Negative for dizziness, weakness, light-headedness, numbness and headaches.  Psychiatric/Behavioral: Negative.     Physical Exam Updated Vital Signs  BP 120/69    Pulse 92    Temp 98 F (36.7 C) (Oral)    Resp 20    Ht 5' 9"  (1.753 m)    Wt 129 kg    SpO2 91%    BMI 42.00 kg/m  Physical Exam Vitals and nursing note reviewed.  Constitutional:      Appearance: She is well-developed.  HENT:     Head: Normocephalic and atraumatic.  Eyes:     Conjunctiva/sclera: Conjunctivae normal.  Cardiovascular:     Rate and Rhythm: Normal rate and regular rhythm.     Heart sounds: Normal heart sounds.  Pulmonary:     Effort: Pulmonary effort is normal.     Breath sounds: Normal breath sounds. No wheezing.  Abdominal:     General: Abdomen is protuberant. Bowel sounds are normal.     Palpations: Abdomen is soft. There is no fluid wave or mass.     Tenderness: There is abdominal tenderness in the periumbilical area. There is no guarding or rebound.  Musculoskeletal:        General: Normal range of motion.     Cervical back: Normal range of motion.  Skin:    General: Skin is warm and dry.  Neurological:     Mental Status: She is alert.    ED Results / Procedures / Treatments   Labs (all labs ordered  are listed, but only abnormal results are displayed) Labs Reviewed  URINE CULTURE - Abnormal; Notable for the following components:      Result Value   Culture 40,000 COLONIES/mL KLEBSIELLA PNEUMONIAE (*)    Organism ID, Bacteria KLEBSIELLA PNEUMONIAE (*)    All other components within normal limits  CBC WITH DIFFERENTIAL/PLATELET - Abnormal; Notable for the following components:   Platelets 65 (*)    All other components within normal limits  COMPREHENSIVE METABOLIC PANEL - Abnormal; Notable for the following components:   Sodium 130 (*)    Chloride 96 (*)    Glucose, Bld 422 (*)    Calcium 8.3 (*)    Albumin 3.3 (*)    Total Bilirubin 1.5 (*)    All other components within normal limits  URINALYSIS, ROUTINE W REFLEX MICROSCOPIC - Abnormal; Notable for the following components:   APPearance HAZY (*)    Specific Gravity, Urine 1.038 (*)    Glucose, UA >=500 (*)    Hgb urine dipstick LARGE (*)    Leukocytes,Ua LARGE (*)    Bacteria, UA RARE (*)    All other components within normal limits  CBG MONITORING, ED - Abnormal; Notable for the following components:   Glucose-Capillary 444 (*)    All other components within normal limits  CBG MONITORING, ED - Abnormal; Notable for the following components:   Glucose-Capillary 290 (*)    All other components within normal limits  LIPASE, BLOOD  PREGNANCY, URINE    EKG None  Radiology No results found.  Procedures Procedures    Medications Ordered in ED Medications  sodium chloride 0.9 % bolus 1,000 mL (0 mLs Intravenous Stopped 11/08/21 1634)  ondansetron (ZOFRAN) injection 4 mg (4 mg Intravenous Given 11/08/21 1507)  insulin aspart (novoLOG) injection 12 Units (12 Units Intravenous Given 11/08/21 1508)  fluconazole (DIFLUCAN) tablet 150 mg (150 mg Oral Given 11/08/21 1634)  oxyCODONE (Oxy IR/ROXICODONE) immediate release tablet 5 mg (5 mg Oral Given 11/08/21 1829)    ED Course/ Medical Decision Making/ A&P  Medical Decision Making  This patient presents to the ED for chief complaint of acute on chronic abdominal pain, nausea, hyperglycemia, this involves an extensive number of treatment options. The differential diagnosis includes abdominal pain associated with dka, acute intra abdominal infection including colitis, appendicitis, diverticulitis, sbo.    Co morbidities that complicate the patient evaluation  Diabetes Hepatitis C with known cirrhosis Chronic abdominal pain Jerrye Bushy Methadone pt  Additional history obtained:  Additional history obtained from pt chart  Lab Tests:  I Ordered, and personally interpreted labs.  The pertinent results include:  initial glucose, 444 here, improved to 290 after receiving fluids and insulin,  she is not in dka.  Her total bilirubin in 1.5 and her platelet count is 65 , both of these results are consistent with her baseline and a reflection of her chronic cirrhosis. She does have a large amount of hgb and leukocytes in her urine, but rare bacteria, no urinary complaints, pending urine cx results. She does have yeast present in her urine sample.  Imaging Studies ordered:  I ordered imaging studies including CT abd/pelvis  I independently visualized and interpreted imaging which showed small fat containing umbilical hernia, also question small area of consolidation right lower lung field, however, pt has no respiratory complaints. I agree with the radiologist interpretation   Medicines ordered and prescription drug management:  I ordered medication including IVF, insulin, zofran, oxycodone  for dilution of hyperglycemia, pain and nausea.  She was also given a diflucan tablet prior to dc due to yeast present in urine, suspect vaginal contaminant as there were squamous cells present in sample. Reevaluation of the patient after these medicines showed that the patient improved I have reviewed the patients home medicines and have made adjustments as  needed   Critical Interventions:  Fluids and insulin for improvement of hyperglycemia.   Reevaluation:  After the interventions noted above, I reevaluated the patient and found that they have :improved  Social Determinants of Health:  N/a  Disposition:  After consideration of the diagnostic results and the patients response to treatment, I feel that the most appropriate treatment course includes close outpatient f/u. Pt advised close watch on cbg's at home (she has a Dexcom device). Referral to gen surg as outpt to discuss possible umbilical hernia is source of her chronic pain issues. Fat containing currently, non emergent. Discussed strict return precautions.  Also discussed home interventions for this problem (ice, gentle pressure).          Final Clinical Impression(s) / ED Diagnoses Final diagnoses:  Hyperglycemia due to diabetes mellitus ([place])  Umbilical hernia without obstruction or gangrene    Rx / DC Orders ED Discharge Orders     None         Landis Martins 11/11/21 2057    Davonna Belling, MD 11/12/21 [phone]

## 2021-11-11 NOTE — Telephone Encounter (Signed)
Last seen 10/23/2021 gerd. By Vikki Ports.

## 2021-11-12 ENCOUNTER — Telehealth (INDEPENDENT_AMBULATORY_CARE_PROVIDER_SITE_OTHER): Payer: Self-pay

## 2021-11-12 ENCOUNTER — Telehealth: Payer: Self-pay

## 2021-11-12 ENCOUNTER — Other Ambulatory Visit: Payer: Self-pay | Admitting: Nurse Practitioner

## 2021-11-12 ENCOUNTER — Telehealth: Payer: Self-pay | Admitting: Nurse Practitioner

## 2021-11-12 MED ORDER — BLOOD GLUCOSE METER KIT: PACK | 0 refills | Status: DC

## 2021-11-12 NOTE — Telephone Encounter (Signed)
Jaime [name] is calling stating that her blood sugars are down now in the 200's and she is wanting to schedule her Tcs/Egd now please advise?

## 2021-11-12 NOTE — Telephone Encounter (Signed)
Patient stopped by and stated she called Dexcom and they are not seeing much data either. I made pt aware to use the meter until this is resolved.

## 2021-11-12 NOTE — Telephone Encounter (Signed)
Post ED Visit - Positive Culture Follow-up  Culture report reviewed by antimicrobial stewardship pharmacist: [place] Team []  Elenor Quinones, Pharm.D. [x]  Heide Guile, Pharm.D., BCPS AQ-ID []  Parks Neptune, Pharm.D., BCPS []  Alycia Rossetti, Pharm.D., BCPS []  [place], Pharm.D., BCPS, AAHIVP []  Legrand Como, Pharm.D., BCPS, AAHIVP []  Salome Arnt, PharmD, BCPS []  Johnnette Gourd, PharmD, BCPS []  Hughes Better, PharmD, BCPS []  Leeroy Cha, PharmD []  Laqueta Linden, PharmD, BCPS []  Albertina Parr, PharmD  [place] Team []  Leodis Sias, PharmD []  Lindell Spar, PharmD []  Royetta Asal, PharmD []  Graylin Shiver, Rph []  Rema Fendt) Glennon Mac, PharmD []  Arlyn Dunning, PharmD []  Netta Cedars, PharmD []  Dia Sitter, PharmD []  Leone Haven, PharmD []  Gretta Arab, PharmD []  Theodis Shove, PharmD []  Peggyann Juba, PharmD []  Reuel Boom, PharmD   Positive urine culture Per ED provider Elnora Morrison, DO no treatment needed and no further patient follow-up is required at this time.  Glennon Hamilton 11/12/2021, 9:56 AM

## 2021-11-12 NOTE — Telephone Encounter (Signed)
Pt states she can not get her sugar under 400 and is needing you to pull her readings and give her advice.

## 2021-11-12 NOTE — Telephone Encounter (Signed)
I spoke to Jaime [name] and she is aware of the recommendation

## 2021-11-12 NOTE — Telephone Encounter (Signed)
Called the patient and she stated that she is checking her glucose several times a day and I let her know that we are not able to see all of those readings. I advised for patient to contact Dexcom to see if they can help her since on her cell phone. Patient verbalized an understanding and will let us know what they say.

## 2021-11-12 NOTE — Telephone Encounter (Signed)
She has an appointment with me in 5 days in clinic, she needs to bring her logbook of fingersticks Thanks

## 2021-11-12 NOTE — Telephone Encounter (Signed)
Can you call her and find out if she is having trouble with her Dexcom?  I pulled up her info and I am only seeing very little info, barely any data.  I cant make any recommendations without seeing her readings.  If she is having trouble with the Dexcom, have her start using a back up method to test her sugar.

## 2021-11-14 ENCOUNTER — Telehealth: Payer: Self-pay | Admitting: Nurse Practitioner

## 2021-11-14 NOTE — Telephone Encounter (Signed)
Patient came by, she saw her PCP yesterday who was recommending we start the Omnipod to help her obtain control of her diabetes.  We had discussed this on previous visits but given the high doses of insulin she requires, it may not be practical as she would be changing out the pod every 6 hrs or so.  She has been having high readings on her Dexcom.  I increased her Toujeo to 140 units SQ nightly and increased her Humalog to 40-46 units TID with meals to help bring them down.  However, she knows she will have to continue to work on diet and exercise to make it effective- which has been the hardest thing for her to change.  She has done it before when she was at [facility] and was walking more.  Her mom is also cutting back hours at work and will be more available to assist her in her diabetes management.  We also discussed switching to more concentrated insulin such as U500.

## 2021-11-17 ENCOUNTER — Other Ambulatory Visit: Payer: Self-pay

## 2021-11-17 ENCOUNTER — Ambulatory Visit (INDEPENDENT_AMBULATORY_CARE_PROVIDER_SITE_OTHER): Payer: Medicaid Other | Admitting: Gastroenterology

## 2021-11-17 ENCOUNTER — Encounter (INDEPENDENT_AMBULATORY_CARE_PROVIDER_SITE_OTHER): Payer: Self-pay | Admitting: Gastroenterology

## 2021-11-17 VITALS — BP 121/81 | HR 99 | Temp 98.1°F | Ht 70.0 in | Wt 283.3 lb

## 2021-11-17 DIAGNOSIS — R1084 Generalized abdominal pain: Secondary | ICD-10-CM

## 2021-11-17 DIAGNOSIS — R1319 Other dysphagia: Secondary | ICD-10-CM | POA: Diagnosis not present

## 2021-11-17 DIAGNOSIS — K7460 Unspecified cirrhosis of liver: Secondary | ICD-10-CM | POA: Diagnosis not present

## 2021-11-17 DIAGNOSIS — R112 Nausea with vomiting, unspecified: Secondary | ICD-10-CM

## 2021-11-17 DIAGNOSIS — R109 Unspecified abdominal pain: Secondary | ICD-10-CM | POA: Insufficient documentation

## 2021-11-17 MED ORDER — ONDANSETRON HCL 4 MG PO TABS: 4.0000 mg | ORAL_TABLET | Freq: Three times a day (TID) | ORAL | 1 refills | Status: DC | PRN

## 2021-11-17 MED ORDER — HYOSCYAMINE SULFATE 0.125 MG PO TBDP: ORAL_TABLET | ORAL | 0 refills | Status: DC

## 2021-11-17 NOTE — H&P (View-Only) (Signed)
Antony Contras, M.D. Gastroenterology & Hepatology [facility] [address] [place], [place] [address]  Primary Care Physician: Neale Burly, MD [place] [address]  I will communicate my assessment and recommendations to the referring MD via EMR.  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  The patient was last seen on 10/23/2021. At that time, the patient was advised to have better and tighter control of her fingersticks before scheduling her repeat EGD and colonoscopy as it had been canceled 2 times in the past.  Patient is a poor historian - states mother takes care of her medicines and knows better what medications she is taking. The patient reports that she has been persisting nausea and actually has been vomiting at least once a day. She is not taking any medication for it. She used to use Zofran but ran out of the medicine and is requesting refills today. She has presented some coffee ground vomiting occasionally. She also reports some epigastric abdominal pain for multiple months. Also endorses a chronic history of fresh blood in her stool. She usually moves her Bms every other day, but can be more depending on what she eats. States the stools are usually soft.  Patient brings a log book with her fingerstick from 11/02/2021 to 11/17/2021 which have fluctuated between 140s to 240s. She has had her sugar controlled frequently.  Also states she has recurrent episodes of choking - reports that it is very hard for her  to swallow both solids and liquids.  The patient denies having any nausea, vomiting, fever, chills, hematochezia, melena, hematemesis, diarrhea, jaundice, pruritus or weight loss.  As part of the evaluation of her episodes of abdominal pain and bloating, she will went to the ER on 11/08/2021.  Underwent a CT of the abdomen and pelvis without IV contrast that showed presence of cirrhosis with splenomegaly and splenorenal varices, there were portosystemic collaterals.  There was a small hiatal hernia and umbilical hernia of small size.  She was also found to have a pneumonia at that time.  Most recent lab results from her most recent ER visit on 11/10/2021 showed sodium of 133, potassium of 4.2, chloride 98, creatinine 0.59, alkaline phosphatase 120, AST 36, ALT 34, total bilirubin 1.3, CBC with white cell count of 2.9, hemoglobin of 13.9 and platelets of 56,000.  No INR was checked.  Cirrhosis related questions: History of variceal bleeding: No Abdominal distention/worsening ascitesNo Fever/chills: No Episodes of confusion/disorientation: No Number of daily bowel movements:every other day Taking diuretics?: Yes, spironolactone 100 mg and lasix 80 mg qday Prior history of banding?: No, but she is taking propranolol 10 mg TID but she does not know why Prior episodes of SBP: No Last time liver imaging was performed:08/07/2021 - status post cholecystectomy, no masses, last AFP 5.1 MELD score:07/29/2021 - 15    Last EGD: 06/12/20, candida in upper esophagus (treated with diflucan), ulcerative esophagitis at GE junction, gastritis in stomach, single ulcer in duodenal bulb, with duodenal mucosa showing no abnormality. No presence of varices Last Colonoscopy: possibly in  2012 - per notes unremarkable but I'm unable to retrieve these notes ° °Past Medical History: °Past Medical History:  °Diagnosis Date  ° Anxiety   ° Asthma   ° Chronic abdominal pain   ° Chronic back pain   ° Cirrhosis (HCC)   ° Cirrhosis  (HCC)   ° COPD (chronic obstructive pulmonary disease) (HCC)   ° Depression   ° Diabetes mellitus without complication (HCC)   ° Diabetes mellitus, type II (HCC)   ° Hepatitis C   ° Insomnia   ° 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)   ° Lupus (HCC)   ° Migraine headache   ° Neuropathy   ° Nocturnal seizures (HCC)   ° Peptic ulcer   ° Spleen enlarged   ° ° °Past Surgical History: °Past Surgical History:  °Procedure Laterality Date  ° CHOLECYSTECTOMY    ° ESOPHAGOGASTRODUODENOSCOPY  06/2020  ° done at [facility], candida in upper esophagus (treated with diflucan), ulcerative esophagitis at GE junction, gastritis in stomach, single ulcer in duodenal bulb, with duodenal mucosa showing no abnormality. No presence of varices  ° ° °Family History: °Family History  °Problem Relation Age of Onset  ° Hypertension Mother   ° Hyperlipidemia Mother   ° Hyperlipidemia Maternal Grandfather   ° ° °Social History: °Social History  ° °Tobacco Use  °Smoking Status Every Day  ° Types: E-cigarettes  °Smokeless Tobacco Never  ° °Social History  ° °Substance and Sexual Activity  °Alcohol Use No  ° °Social History  ° °Substance and Sexual Activity  °Drug Use No  ° ° °Allergies: °Allergies  °Allergen Reactions  ° Iodine-131 Anaphylaxis, Shortness Of Breath and Swelling  ° Ivp Dye [Iodinated Contrast Media] Anaphylaxis  °  Skin gets very red, unable to walk °  ° Ketorolac Tromethamine Shortness Of Breath  ° Tylenol [Acetaminophen] Other (See Comments)  °  Due to cirrhosis °  ° Gabapentin   ° Morphine And Related   °  Pt says she is not allergic to Morphine 08/12/20  ° Nsaids Other (See Comments)  °  Flares ulcers  ° Suboxone [Buprenorphine Hcl-Naloxone Hcl]   ° Vancomycin Swelling  °  Facial swelling,   ° ° °Medications: °Current Outpatient Medications  °Medication Sig Dispense Refill  ° albuterol (ACCUNEB) 0.63 MG/3ML nebulizer solution Take by nebulization 4 (four) times daily.    ° ARIPiprazole (ABILIFY) 20 MG tablet  Take 20 mg by mouth daily.    ° aspirin EC 81 MG tablet Take 81 mg by mouth daily. Swallow whole.    ° blood glucose meter kit and supplies Dispense based on patient and insurance preference. Use up to four times daily as directed. (FOR ICD-10 E10.9, E11.9). 1 each 0  ° cetirizine (ZYRTEC ALLERGY) 10 MG tablet Take 1 tablet (10 mg total) by mouth daily. 30 tablet 0  ° clonazePAM (KLONOPIN) 0.5 MG tablet Take 1 tablet (0.5 mg total) by mouth 2 (two) times daily as needed for anxiety. 60 tablet 2  ° Continuous Blood Gluc Sensor (DEXCOM G6 SENSOR) MISC 4 Pieces by Does not apply route once a week. 4 each 2  ° Continuous Blood Gluc Transmit (DEXCOM G6 TRANSMITTER) MISC 1 Piece by Does not apply route as directed. 1 each 1  ° cromolyn (OPTICROM) 4 % ophthalmic solution Place 1 drop into both eyes 2 (two) times daily.    ° etonogestrel (NEXPLANON) 68 MG IMPL implant 1 each by Subdermal route once.    °   furosemide (LASIX) 40 MG tablet Take 80 mg by mouth daily.     ° glipiZIDE (GLUCOTROL XL) 5 MG 24 hr tablet Take 1 tablet (5 mg total) by mouth daily with breakfast. 90 tablet 3  ° glucose blood (ACCU-CHEK GUIDE) test strip Use as instructed to check blood glucose four times daily 150 each 5  ° hyoscyamine (ANASPAZ) 0.125 MG TBDP disintergrating tablet TAKE 1 TABLET UNDER THE TONGUE THREE TIMES A DAY AS NEEDED FOR BLADDER SPASMS OR CRAMPING. 90 tablet 0  ° insulin aspart (NOVOLOG FLEXPEN) 100 UNIT/ML FlexPen Inject 30-36 Units into the skin 3 (three) times daily with meals. 30 mL 1  ° insulin glargine, 2 Unit Dial, (TOUJEO MAX SOLOSTAR) 300 UNIT/ML Solostar Pen Inject 120 Units into the skin at bedtime. (Patient taking differently: Inject 140 Units into the skin at bedtime.) 30 mL 3  ° Insulin Pen Needle (GLOBAL EASE INJECT PEN NEEDLES) 31G X 8 MM MISC Use 1 pen needle 4 times daily for insulin. 400 each 0  ° Insulin Pen Needle (PEN NEEDLES) 32G X 6 MM MISC 1 each by Does not apply route 3 (three) times daily. 100 each 3  °  liraglutide (VICTOZA) 18 MG/3ML SOPN Inject 1.8 mg into the skin daily. 21 mL 3  ° lisinopril (ZESTRIL) 5 MG tablet TAKE 1 TABLET ONCE DAILY. 90 tablet 0  ° metFORMIN (GLUCOPHAGE) 1000 MG tablet Take 1 tablet (1,000 mg total) by mouth 2 (two) times daily with a meal. 180 tablet 3  ° methadone (DOLOPHINE) 10 MG/5ML solution Take 60 mg by mouth every morning. 50mg    ° Multiple Vitamin (MULTIVITAMIN) tablet Take 1 tablet by mouth daily.    ° nystatin (MYCOSTATIN/NYSTOP) powder Apply 1 application topically 3 (three) times daily. 15 g 0  ° omeprazole (PRILOSEC) 40 MG capsule Take 1 capsule (40 mg total) by mouth daily. 90 capsule 3  ° ondansetron (ZOFRAN) 4 MG tablet TAKE 1 TABLET BY MOUTH EVERY 8 HOURS AS NEEDED FOR NAUSEA AND VOMITING. 30 tablet 0  ° pantoprazole (PROTONIX) 40 MG tablet Take 40 mg by mouth 2 (two) times daily.    ° potassium chloride (KLOR-CON M) 10 MEQ tablet Take 10 mEq by mouth 2 (two) times daily.    ° pregabalin (LYRICA) 75 MG capsule Take 75 mg by mouth in the morning, at noon, in the evening, and at bedtime.     ° PROAIR HFA 108 (90 Base) MCG/ACT inhaler Inhale 1-2 puffs into the lungs every 6 (six) hours as needed.    ° propranolol (INDERAL) 10 MG tablet Take 10 mg by mouth 3 (three) times daily.    ° RESTASIS 0.05 % ophthalmic emulsion Place 1 drop into both eyes as needed (dry eye).     ° rosuvastatin (CRESTOR) 5 MG tablet Take 1 tablet (5 mg total) by mouth daily. 90 tablet 3  ° spironolactone (ALDACTONE) 100 MG tablet Take 100 mg by mouth daily.     ° sucralfate (CARAFATE) 1 GM/10ML suspension Take 10 mLs (1 g total) by mouth 4 (four) times daily -  with meals and at bedtime. 420 mL 1  ° triamcinolone (KENALOG) 0.1 % Apply topically daily.    ° venlafaxine XR (EFFEXOR-XR) 150 MG 24 hr capsule Take total of 225 mg daily (150 mg + 75 mg ) 90 capsule 0  ° venlafaxine XR (EFFEXOR-XR) 75 MG 24 hr capsule To take along with 150mg for a total of 225 mg daily 90 capsule 0  ° °  No current  facility-administered medications for this visit.  ° ° °Review of Systems: °GENERAL: negative for malaise, night sweats °HEENT: No changes in hearing or vision, no nose bleeds or other nasal problems. °NECK: Negative for lumps, goiter, pain and significant neck swelling °RESPIRATORY: Negative for cough, wheezing °CARDIOVASCULAR: Negative for chest pain, leg swelling, palpitations, orthopnea °GI: SEE HPI °MUSCULOSKELETAL: Negative for joint pain or swelling, back pain, and muscle pain. °SKIN: Negative for lesions, rash °PSYCH: Negative for sleep disturbance, mood disorder and recent psychosocial stressors. °HEMATOLOGY Negative for prolonged bleeding, bruising easily, and swollen nodes. °ENDOCRINE: Negative for cold or heat intolerance, polyuria, polydipsia and goiter. °NEURO: negative for tremor, gait imbalance, syncope and seizures. °The remainder of the review of systems is noncontributory. ° ° °Physical Exam: °BP 121/81 (BP Location: Left Arm, Patient Position: Sitting, Cuff Size: Large)    Pulse 99    Temp 98.1 °F (36.7 °C) (Oral)    Ht 5' 10" (1.778 m)    Wt 283 lb 4.8 oz (128.5 kg)    BMI 40.65 kg/m²  °GENERAL: The patient is AO x3, in no acute distress. Obese. °HEENT: Head is normocephalic and atraumatic. EOMI are intact. Mouth is well hydrated and without lesions. °NECK: Supple. No masses °LUNGS: Clear to auscultation. No presence of rhonchi/wheezing/rales. Adequate chest expansion °HEART: RRR, normal s1 and s2. °ABDOMEN: mildly tender upon palpation diffusely, no guarding, no peritoneal signs, and nondistended. BS +. No masses. °EXTREMITIES: Without any cyanosis, clubbing, rash, lesions or edema. °NEUROLOGIC: AOx3, no focal motor deficit. °SKIN: no jaundice, no rashes ° °Imaging/Labs: °as above ° °I personally reviewed and interpreted the available labs, imaging and endoscopic files. ° °Impression and Plan: °Ladell Reinhardt is a 35 y.o. female with past medical history of Liver cirrhosis due to hepatitis C  and NASH, history of hepatitis C 3a s/p Mavyret with SVR, history of IV drug use, opiate dependence, diabetes, depression, COPD, anxiety, asthma, lupus, who presents for follow up of abdominal pain and liver cirrhosis.  Patient has had multiple complaints in the past which have persisted through multiple months.  For this she has been evaluated with cross-sectional abdominal imaging without presence of any new alterations explaining her symptoms.  We have attempted to schedule an EGD and a colonoscopy in the past but unfortunately given her poorly controlled diabetes he has been very difficult to proceed with this.  Fortunately, her most recent fingersticks have shown better control of her disease.  We will proceed with an EGD with gastric and small bowel biopsies that will help in the evaluation of her abdominal pain and dysphagia (may consider performing biopsies of the esophagus and empiric dilation if no varices are found), as well as a colonoscopy for evaluation of her episode of rectal bleeding.  Patient understood and agreed.  I emphasized the importance of having adequate control of her fingersticks.  We will give symptomatic treatment with hyoscyamine and Zofran as needed for now. ° °Regarding her liver cirrhosis, she had some evidence of portal hypertension in her cross-sectional abdominal imaging but has not present any episodes of decompensation recently.  She is currently on diuretics for possible episodes of ascites in the past but is not currently present any third spacing at the moment, will continue same dosage for now.  She is up-to-date in terms of her HCC screening as she had ultrasound and AFP in October 2022.  We will obtain repeat MELD labs to determine her current MELD score as she may be a candidate   for liver transplant if she achieves significant weight loss in the future and abstains from drug use.  Finally, she is taking propranolol for unclear reasons but I suspect this is for variceal  prophylaxis, she should continue same dosage of propanolol for now.  Patient understood and agreed. ° °- Schedule EGD and colonoscopy °- Continue lasix and furosemide at same dosage °- Start Zofran 4 mg q8h as needed for nausea °- Can take Levsin for abdominal pain episodes as needed °- Continue propranolol 10 mg TID °- MELD labs today °- Reduce salt intake to <2 g per day °- Can take Tylenol max of 2 g per day (650 mg q8h) for pain °- Avoid NSAIDs for pain °- Avoid eating raw oysters/shellfish °- Protein shake (Ensure or Boost) every night before going to sleep ° °All questions were answered.     ° °Derita Michelsen Castaneda Mayorga, MD °Gastroenterology and Hepatology °[facility] ° °

## 2021-11-17 NOTE — Progress Notes (Addendum)
Antony Contras, M.D. Gastroenterology & Hepatology [facility] [address] [place], [place] [address]  Primary Care Physician: Neale Burly, MD [place] [address]  I will communicate my assessment and recommendations to the referring MD via EMR.  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  The patient was last seen on 10/23/2021. At that time, the patient was advised to have better and tighter control of her fingersticks before scheduling her repeat EGD and colonoscopy as it had been canceled 2 times in the past.  Patient is a poor historian - states mother takes care of her medicines and knows better what medications she is taking. The patient reports that she has been persisting nausea and actually has been vomiting at least once a day. She is not taking any medication for it. She used to use Zofran but ran out of the medicine and is requesting refills today. She has presented some coffee ground vomiting occasionally. She also reports some epigastric abdominal pain for multiple months. Also endorses a chronic history of fresh blood in her stool. She usually moves her Bms every other day, but can be more depending on what she eats. States the stools are usually soft.  Patient brings a log book with her fingerstick from 11/02/2021 to 11/17/2021 which have fluctuated between 140s to 240s. She has had her sugar controlled frequently.  Also states she has recurrent episodes of choking - reports that it is very hard for her  to swallow both solids and liquids.  The patient denies having any nausea, vomiting, fever, chills, hematochezia, melena, hematemesis, diarrhea, jaundice, pruritus or weight loss.  As part of the evaluation of her episodes of abdominal pain and bloating, she will went to the ER on 11/08/2021.  Underwent a CT of the abdomen and pelvis without IV contrast that showed presence of cirrhosis with splenomegaly and splenorenal varices, there were portosystemic collaterals.  There was a small hiatal hernia and umbilical hernia of small size.  She was also found to have a pneumonia at that time.  Most recent lab results from her most recent ER visit on 11/10/2021 showed sodium of 133, potassium of 4.2, chloride 98, creatinine 0.59, alkaline phosphatase 120, AST 36, ALT 34, total bilirubin 1.3, CBC with white cell count of 2.9, hemoglobin of 13.9 and platelets of 56,000.  No INR was checked.  Cirrhosis related questions: History of variceal bleeding: No Abdominal distention/worsening ascitesNo Fever/chills: No Episodes of confusion/disorientation: No Number of daily bowel movements:every other day Taking diuretics?: Yes, spironolactone 100 mg and lasix 80 mg qday Prior history of banding?: No, but she is taking propranolol 10 mg TID but she does not know why Prior episodes of SBP: No Last time liver imaging was performed:08/07/2021 - status post cholecystectomy, no masses, last AFP 5.1 MELD score:07/29/2021 - 15    Last EGD: 06/12/20, candida in upper esophagus (treated with diflucan), ulcerative esophagitis at GE junction, gastritis in stomach, single ulcer in duodenal bulb, with duodenal mucosa showing no abnormality. No presence of varices Last Colonoscopy: possibly in  2012 - per notes unremarkable but I'm unable to retrieve these notes  Past Medical History: Past Medical History:  Diagnosis Date   Anxiety    Asthma    Chronic abdominal pain    Chronic back pain    Cirrhosis ([place])    Cirrhosis  ([place])    COPD (chronic obstructive pulmonary disease) ([place])    Depression    Diabetes mellitus without complication ([place])    Diabetes mellitus, type II ([place])    Hepatitis C    Insomnia    Long-term current use of methadone for opiate dependence ([place])    Lupus ([place])    Migraine headache    Neuropathy    Nocturnal seizures ([place])    Peptic ulcer    Spleen enlarged     Past Surgical History: Past Surgical History:  Procedure Laterality Date   CHOLECYSTECTOMY     ESOPHAGOGASTRODUODENOSCOPY  06/2020   done at [facility], candida in upper esophagus (treated with diflucan), ulcerative esophagitis at GE junction, gastritis in stomach, single ulcer in duodenal bulb, with duodenal mucosa showing no abnormality. No presence of varices    Family History: Family History  Problem Relation Age of Onset   Hypertension Mother    Hyperlipidemia Mother    Hyperlipidemia Maternal Grandfather     Social History: Social History   Tobacco Use  Smoking Status Every Day   Types: E-cigarettes  Smokeless Tobacco Never   Social History   Substance and Sexual Activity  Alcohol Use No   Social History   Substance and Sexual Activity  Drug Use No    Allergies: Allergies  Allergen Reactions   Iodine-131 Anaphylaxis, Shortness Of Breath and Swelling   Ivp Dye [Iodinated Contrast Media] Anaphylaxis    Skin gets very red, unable to walk    Ketorolac Tromethamine Shortness Of Breath   Tylenol [Acetaminophen] Other (See Comments)    Due to cirrhosis    Gabapentin    Morphine And Related     Pt says she is not allergic to Morphine 08/12/20   Nsaids Other (See Comments)    Flares ulcers   Suboxone [Buprenorphine Hcl-Naloxone Hcl]    Vancomycin Swelling    Facial swelling,     Medications: Current Outpatient Medications  Medication Sig Dispense Refill   albuterol (ACCUNEB) 0.63 MG/3ML nebulizer solution Take by nebulization 4 (four) times daily.     ARIPiprazole (ABILIFY) 20 MG tablet  Take 20 mg by mouth daily.     aspirin EC 81 MG tablet Take 81 mg by mouth daily. Swallow whole.     blood glucose meter kit and supplies Dispense based on patient and insurance preference. Use up to four times daily as directed. (FOR ICD-10 E10.9, E11.9). 1 each 0   cetirizine (ZYRTEC ALLERGY) 10 MG tablet Take 1 tablet (10 mg total) by mouth daily. 30 tablet 0   clonazePAM (KLONOPIN) 0.5 MG tablet Take 1 tablet (0.5 mg total) by mouth 2 (two) times daily as needed for anxiety. 60 tablet 2   Continuous Blood Gluc Sensor (DEXCOM G6 SENSOR) MISC 4 Pieces by Does not apply route once a week. 4 each 2   Continuous Blood Gluc Transmit (DEXCOM G6 TRANSMITTER) MISC 1 Piece by Does not apply route as directed. 1 each 1   cromolyn (OPTICROM) 4 % ophthalmic solution Place 1 drop into both eyes 2 (two) times daily.     etonogestrel (NEXPLANON) 68 MG IMPL implant 1 each by Subdermal route once.  furosemide (LASIX) 40 MG tablet Take 80 mg by mouth daily.      glipiZIDE (GLUCOTROL XL) 5 MG 24 hr tablet Take 1 tablet (5 mg total) by mouth daily with breakfast. 90 tablet 3   glucose blood (ACCU-CHEK GUIDE) test strip Use as instructed to check blood glucose four times daily 150 each 5   hyoscyamine (ANASPAZ) 0.125 MG TBDP disintergrating tablet TAKE 1 TABLET UNDER THE TONGUE THREE TIMES A DAY AS NEEDED FOR BLADDER SPASMS OR CRAMPING. 90 tablet 0   insulin aspart (NOVOLOG FLEXPEN) 100 UNIT/ML FlexPen Inject 30-36 Units into the skin 3 (three) times daily with meals. 30 mL 1   insulin glargine, 2 Unit Dial, (TOUJEO MAX SOLOSTAR) 300 UNIT/ML Solostar Pen Inject 120 Units into the skin at bedtime. (Patient taking differently: Inject 140 Units into the skin at bedtime.) 30 mL 3   Insulin Pen Needle (GLOBAL EASE INJECT PEN NEEDLES) 31G X 8 MM MISC Use 1 pen needle 4 times daily for insulin. 400 each 0   Insulin Pen Needle (PEN NEEDLES) 32G X 6 MM MISC 1 each by Does not apply route 3 (three) times daily. 100 each 3    liraglutide (VICTOZA) 18 MG/3ML SOPN Inject 1.8 mg into the skin daily. 21 mL 3   lisinopril (ZESTRIL) 5 MG tablet TAKE 1 TABLET ONCE DAILY. 90 tablet 0   metFORMIN (GLUCOPHAGE) 1000 MG tablet Take 1 tablet (1,000 mg total) by mouth 2 (two) times daily with a meal. 180 tablet 3   methadone (DOLOPHINE) 10 MG/5ML solution Take 60 mg by mouth every morning. 11m     Multiple Vitamin (MULTIVITAMIN) tablet Take 1 tablet by mouth daily.     nystatin (MYCOSTATIN/NYSTOP) powder Apply 1 application topically 3 (three) times daily. 15 g 0   omeprazole (PRILOSEC) 40 MG capsule Take 1 capsule (40 mg total) by mouth daily. 90 capsule 3   ondansetron (ZOFRAN) 4 MG tablet TAKE 1 TABLET BY MOUTH EVERY 8 HOURS AS NEEDED FOR NAUSEA AND VOMITING. 30 tablet 0   pantoprazole (PROTONIX) 40 MG tablet Take 40 mg by mouth 2 (two) times daily.     potassium chloride (KLOR-CON M) 10 MEQ tablet Take 10 mEq by mouth 2 (two) times daily.     pregabalin (LYRICA) 75 MG capsule Take 75 mg by mouth in the morning, at noon, in the evening, and at bedtime.      PROAIR HFA 108 (90 Base) MCG/ACT inhaler Inhale 1-2 puffs into the lungs every 6 (six) hours as needed.     propranolol (INDERAL) 10 MG tablet Take 10 mg by mouth 3 (three) times daily.     RESTASIS 0.05 % ophthalmic emulsion Place 1 drop into both eyes as needed (dry eye).      rosuvastatin (CRESTOR) 5 MG tablet Take 1 tablet (5 mg total) by mouth daily. 90 tablet 3   spironolactone (ALDACTONE) 100 MG tablet Take 100 mg by mouth daily.      sucralfate (CARAFATE) 1 GM/10ML suspension Take 10 mLs (1 g total) by mouth 4 (four) times daily -  with meals and at bedtime. 420 mL 1   triamcinolone (KENALOG) 0.1 % Apply topically daily.     venlafaxine XR (EFFEXOR-XR) 150 MG 24 hr capsule Take total of 225 mg daily (150 mg + 75 mg ) 90 capsule 0   venlafaxine XR (EFFEXOR-XR) 75 MG 24 hr capsule To take along with 1520mfor a total of 225 mg daily 90 capsule 0  No current  facility-administered medications for this visit.    Review of Systems: GENERAL: negative for malaise, night sweats HEENT: No changes in hearing or vision, no nose bleeds or other nasal problems. NECK: Negative for lumps, goiter, pain and significant neck swelling RESPIRATORY: Negative for cough, wheezing CARDIOVASCULAR: Negative for chest pain, leg swelling, palpitations, orthopnea GI: SEE HPI MUSCULOSKELETAL: Negative for joint pain or swelling, back pain, and muscle pain. SKIN: Negative for lesions, rash PSYCH: Negative for sleep disturbance, mood disorder and recent psychosocial stressors. HEMATOLOGY Negative for prolonged bleeding, bruising easily, and swollen nodes. ENDOCRINE: Negative for cold or heat intolerance, polyuria, polydipsia and goiter. NEURO: negative for tremor, gait imbalance, syncope and seizures. The remainder of the review of systems is noncontributory.   Physical Exam: BP 121/81 (BP Location: Left Arm, Patient Position: Sitting, Cuff Size: Large)    Pulse 99    Temp 98.1 F (36.7 C) (Oral)    Ht 5' 10" (1.778 m)    Wt 283 lb 4.8 oz (128.5 kg)    BMI 40.65 kg/m  GENERAL: The patient is AO x3, in no acute distress. Obese. HEENT: Head is normocephalic and atraumatic. EOMI are intact. Mouth is well hydrated and without lesions. NECK: Supple. No masses LUNGS: Clear to auscultation. No presence of rhonchi/wheezing/rales. Adequate chest expansion HEART: RRR, normal s1 and s2. ABDOMEN: mildly tender upon palpation diffusely, no guarding, no peritoneal signs, and nondistended. BS +. No masses. EXTREMITIES: Without any cyanosis, clubbing, rash, lesions or edema. NEUROLOGIC: AOx3, no focal motor deficit. SKIN: no jaundice, no rashes  Imaging/Labs: as above  I personally reviewed and interpreted the available labs, imaging and endoscopic files.  Impression and Plan: Jaime [name] is a 36 y.o. female with past medical history of Liver cirrhosis due to hepatitis C  and NASH, history of hepatitis C 3a s/p Mavyret with SVR, history of IV drug use, opiate dependence, diabetes, depression, COPD, anxiety, asthma, lupus, who presents for follow up of abdominal pain and liver cirrhosis.  Patient has had multiple complaints in the past which have persisted through multiple months.  For this she has been evaluated with cross-sectional abdominal imaging without presence of any new alterations explaining her symptoms.  We have attempted to schedule an EGD and a colonoscopy in the past but unfortunately given her poorly controlled diabetes he has been very difficult to proceed with this.  Fortunately, her most recent fingersticks have shown better control of her disease.  We will proceed with an EGD with gastric and small bowel biopsies that will help in the evaluation of her abdominal pain and dysphagia (may consider performing biopsies of the esophagus and empiric dilation if no varices are found), as well as a colonoscopy for evaluation of her episode of rectal bleeding.  Patient understood and agreed.  I emphasized the importance of having adequate control of her fingersticks.  We will give symptomatic treatment with hyoscyamine and Zofran as needed for now.  Regarding her liver cirrhosis, she had some evidence of portal hypertension in her cross-sectional abdominal imaging but has not present any episodes of decompensation recently.  She is currently on diuretics for possible episodes of ascites in the past but is not currently present any third spacing at the moment, will continue same dosage for now.  She is up-to-date in terms of her [facility] screening as she had ultrasound and AFP in October 2022.  We will obtain repeat MELD labs to determine her current MELD score as she may be a candidate  for liver transplant if she achieves significant weight loss in the future and abstains from drug use.  Finally, she is taking propranolol for unclear reasons but I suspect this is for variceal  prophylaxis, she should continue same dosage of propanolol for now.  Patient understood and agreed.  - Schedule EGD and colonoscopy - Continue lasix and furosemide at same dosage - Start Zofran 4 mg q8h as needed for nausea - Can take Levsin for abdominal pain episodes as needed - Continue propranolol 10 mg TID - MELD labs today - Reduce salt intake to <2 g per day - Can take Tylenol max of 2 g per day (650 mg q8h) for pain - Avoid NSAIDs for pain - Avoid eating raw oysters/shellfish - Protein shake (Ensure or Boost) every night before going to sleep  All questions were answered.      Harvel Quale, MD Gastroenterology and Hepatology [facility]

## 2021-11-17 NOTE — Patient Instructions (Addendum)
Schedule EGD and colonoscopy Continue lasix and furosemide at same dosage Start Zofran 4 mg q8h as needed for nausea Can take Levsin for abdominal pain episodes as needed Continue propranolol 10 mg TID - Reduce salt intake to <2 g per day - Can take Tylenol max of 2 g per day (650 mg q8h) for pain - Avoid NSAIDs for pain - Avoid eating raw oysters/shellfish - Protein shake (Ensure or Boost) every night before going to sleep

## 2021-11-20 ENCOUNTER — Ambulatory Visit (INDEPENDENT_AMBULATORY_CARE_PROVIDER_SITE_OTHER): Payer: Medicaid Other | Admitting: Gastroenterology

## 2021-11-21 ENCOUNTER — Encounter (INDEPENDENT_AMBULATORY_CARE_PROVIDER_SITE_OTHER): Payer: Self-pay

## 2021-11-21 ENCOUNTER — Other Ambulatory Visit (INDEPENDENT_AMBULATORY_CARE_PROVIDER_SITE_OTHER): Payer: Self-pay

## 2021-11-21 ENCOUNTER — Other Ambulatory Visit (HOSPITAL_COMMUNITY): Payer: Medicaid Other

## 2021-11-21 DIAGNOSIS — I10 Essential (primary) hypertension: Secondary | ICD-10-CM

## 2021-11-21 DIAGNOSIS — Z01812 Encounter for preprocedural laboratory examination: Secondary | ICD-10-CM

## 2021-11-21 NOTE — Patient Instructions (Signed)
Cyrstal Leitz  11/21/2021     @PREFPERIOPPHARMACY @   Your procedure is scheduled on  11/25/2021.   Report to [name] at  [phone] A.M.   Call this number if you have problems the morning of surgery:  [phone]   Remember:  Follow the diet and prep instructions given to you by the office.    Take 70 units of your insulin the night before your procedure.    Use your nebulizer before you come.   Brin extra Dexcom supplies in case your dexcom becomes dislodged.        DO NOT TAKE ANY MEDICATION FOR DIABETES THE MORNING OF YOUR PROCEDURE.     Take these medicines the morning of surgery with A SIP OF WATER        abilify,zyrtec, clonazepam, methadone, prilosec, zofran (if needed), lyrica, propranolol, effexor.     Do not wear jewelry, make-up or nail polish.  Do not wear lotions, powders, or perfumes, or deodorant.  Do not shave 48 hours prior to surgery.  Men may shave face and neck.  Do not bring valuables to the hospital.  [facility] is not responsible for any belongings or valuables.  Contacts, dentures or bridgework may not be worn into surgery.  Leave your suitcase in the car.  After surgery it may be brought to your room.  For patients admitted to the hospital, discharge time will be determined by your treatment team.  Patients discharged the day of surgery will not be allowed to drive home and must have someone with them for 24 hours.    Special instructions:   DO NOT smoke tobacco or vape for 24 hours before your procedure.  Please read over the following fact sheets that you were given. Anesthesia Post-op Instructions and Care and Recovery After Surgery      Upper Endoscopy, Adult, Care After This sheet gives you information about how to care for yourself after your procedure. Your health care provider may also give you more specific instructions. If you have problems or questions, contact your health care provider. What can I expect after the  procedure? After the procedure, it is common to have: A sore throat. Mild stomach pain or discomfort. Bloating. Nausea. Follow these instructions at home:  Follow instructions from your health care provider about what to eat or drink after your procedure. Return to your normal activities as told by your health care provider. Ask your health care provider what activities are safe for you. Take over-the-counter and prescription medicines only as told by your health care provider. If you were given a sedative during the procedure, it can affect you for several hours. Do not drive or operate machinery until your health care provider says that it is safe. Keep all follow-up visits as told by your health care provider. This is important. Contact a health care provider if you have: A sore throat that lasts longer than one day. Trouble swallowing. Get help right away if: You vomit blood or your vomit looks like coffee grounds. You have: A fever. Bloody, black, or tarry stools. A severe sore throat or you cannot swallow. Difficulty breathing. Severe pain in your chest or abdomen. Summary After the procedure, it is common to have a sore throat, mild stomach discomfort, bloating, and nausea. If you were given a sedative during the procedure, it can affect you for several hours. Do not drive or operate machinery until your health care provider says that  it is safe. Follow instructions from your health care provider about what to eat or drink after your procedure. Return to your normal activities as told by your health care provider. This information is not intended to replace advice given to you by your health care provider. Make sure you discuss any questions you have with your health care provider. Document Revised: 08/25/2019 Document Reviewed: 03/21/2018 Elsevier Patient Education  2022 [place]. Colonoscopy, Adult, Care After This sheet gives you information about how to care for  yourself after your procedure. Your health care provider may also give you more specific instructions. If you have problems or questions, contact your health care provider. What can I expect after the procedure? After the procedure, it is common to have: A small amount of blood in your stool for 24 hours after the procedure. Some gas. Mild cramping or bloating of your abdomen. Follow these instructions at home: Eating and drinking  Drink enough fluid to keep your urine pale yellow. Follow instructions from your health care provider about eating or drinking restrictions. Resume your normal diet as instructed by your health care provider. Avoid heavy or fried foods that are hard to digest. Activity Rest as told by your health care provider. Avoid sitting for a long time without moving. Get up to take short walks every 1-2 hours. This is important to improve blood flow and breathing. Ask for help if you feel weak or unsteady. Return to your normal activities as told by your health care provider. Ask your health care provider what activities are safe for you. Managing cramping and bloating  Try walking around when you have cramps or feel bloated. Apply heat to your abdomen as told by your health care provider. Use the heat source that your health care provider recommends, such as a moist heat pack or a heating pad. Place a towel between your skin and the heat source. Leave the heat on for 20-30 minutes. Remove the heat if your skin turns bright red. This is especially important if you are unable to feel pain, heat, or cold. You may have a greater risk of getting burned. General instructions If you were given a sedative during the procedure, it can affect you for several hours. Do not drive or operate machinery until your health care provider says that it is safe. For the first 24 hours after the procedure: Do not sign important documents. Do not drink alcohol. Do your regular daily activities  at a slower pace than normal. Eat soft foods that are easy to digest. Take over-the-counter and prescription medicines only as told by your health care provider. Keep all follow-up visits as told by your health care provider. This is important. Contact a health care provider if: You have blood in your stool 2-3 days after the procedure. Get help right away if you have: More than a small spotting of blood in your stool. Large blood clots in your stool. Swelling of your abdomen. Nausea or vomiting. A fever. Increasing pain in your abdomen that is not relieved with medicine. Summary After the procedure, it is common to have a small amount of blood in your stool. You may also have mild cramping and bloating of your abdomen. If you were given a sedative during the procedure, it can affect you for several hours. Do not drive or operate machinery until your health care provider says that it is safe. Get help right away if you have a lot of blood in your stool, nausea or  vomiting, a fever, or increased pain in your abdomen. This information is not intended to replace advice given to you by your health care provider. Make sure you discuss any questions you have with your health care provider. Document Revised: 08/25/2019 Document Reviewed: 05/15/2019 Elsevier Patient Education  [place] After This sheet gives you information about how to care for yourself after your procedure. Your health care provider may also give you more specific instructions. If you have problems or questions, contact your health care provider. What can I expect after the procedure? After the procedure, it is common to have: Tiredness. Forgetfulness about what happened after the procedure. Impaired judgment for important decisions. Nausea or vomiting. Some difficulty with balance. Follow these instructions at home: For the time period you were told by your health care provider:    Rest as needed. Do not participate in activities where you could fall or become injured. Do not drive or use machinery. Do not drink alcohol. Do not take sleeping pills or medicines that cause drowsiness. Do not make important decisions or sign legal documents. Do not take care of children on your own. Eating and drinking Follow the diet that is recommended by your health care provider. Drink enough fluid to keep your urine pale yellow. If you vomit: Drink water, juice, or soup when you can drink without vomiting. Make sure you have little or no nausea before eating solid foods. General instructions Have a responsible adult stay with you for the time you are told. It is important to have someone help care for you until you are awake and alert. Take over-the-counter and prescription medicines only as told by your health care provider. If you have sleep apnea, surgery and certain medicines can increase your risk for breathing problems. Follow instructions from your health care provider about wearing your sleep device: Anytime you are sleeping, including during daytime naps. While taking prescription pain medicines, sleeping medicines, or medicines that make you drowsy. Avoid smoking. Keep all follow-up visits as told by your health care provider. This is important. Contact a health care provider if: You keep feeling nauseous or you keep vomiting. You feel light-headed. You are still sleepy or having trouble with balance after 24 hours. You develop a rash. You have a fever. You have redness or swelling around the IV site. Get help right away if: You have trouble breathing. You have new-onset confusion at home. Summary For several hours after your procedure, you may feel tired. You may also be forgetful and have poor judgment. Have a responsible adult stay with you for the time you are told. It is important to have someone help care for you until you are awake and alert. Rest as told.  Do not drive or operate machinery. Do not drink alcohol or take sleeping pills. Get help right away if you have trouble breathing, or if you suddenly become confused. This information is not intended to replace advice given to you by your health care provider. Make sure you discuss any questions you have with your health care provider. Document Revised: 07/04/2020 Document Reviewed: 09/21/2019 Elsevier Patient Education  2022 [employer].

## 2021-11-24 ENCOUNTER — Encounter (HOSPITAL_COMMUNITY): Payer: Self-pay

## 2021-11-24 ENCOUNTER — Encounter (HOSPITAL_COMMUNITY)
Admission: RE | Admit: 2021-11-24 | Discharge: 2021-11-24 | Disposition: A | Discharge status: Home / Self Care | Payer: Medicaid Other | Source: Ambulatory Visit | Attending: Gastroenterology | Admitting: Gastroenterology

## 2021-11-24 DIAGNOSIS — B182 Chronic viral hepatitis C: Secondary | ICD-10-CM | POA: Insufficient documentation

## 2021-11-24 DIAGNOSIS — K7460 Unspecified cirrhosis of liver: Secondary | ICD-10-CM | POA: Diagnosis not present

## 2021-11-24 DIAGNOSIS — K921 Melena: Secondary | ICD-10-CM | POA: Insufficient documentation

## 2021-11-24 DIAGNOSIS — E119 Type 2 diabetes mellitus without complications: Secondary | ICD-10-CM | POA: Insufficient documentation

## 2021-11-24 DIAGNOSIS — Z01818 Encounter for other preprocedural examination: Secondary | ICD-10-CM

## 2021-11-24 DIAGNOSIS — Z01812 Encounter for preprocedural laboratory examination: Secondary | ICD-10-CM | POA: Insufficient documentation

## 2021-11-24 LAB — COMPREHENSIVE METABOLIC PANEL WITH GFR
ALT: 63 U/L — ABNORMAL HIGH (ref 0–44)
AST: 58 U/L — ABNORMAL HIGH (ref 15–41)
Albumin: 3.2 g/dL — ABNORMAL LOW (ref 3.5–5.0)
Alkaline Phosphatase: 137 U/L — ABNORMAL HIGH (ref 38–126)
Anion gap: 8 (ref 5–15)
BUN: 6 mg/dL (ref 6–20)
CO2: 23 mmol/L (ref 22–32)
Calcium: 8.3 mg/dL — ABNORMAL LOW (ref 8.9–10.3)
Chloride: 104 mmol/L (ref 98–111)
Creatinine, Ser: 0.48 mg/dL (ref 0.44–1.00)
GFR, Estimated: >60 mL/min (ref >60)
Glucose, Bld: 72 mg/dL (ref 70–99)
Potassium: 3.5 mmol/L (ref 3.5–5.1)
Sodium: 135 mmol/L (ref 135–145)
Total Bilirubin: 1.1 mg/dL (ref 0.3–1.2)
Total Protein: 6.6 g/dL (ref 6.5–8.1)

## 2021-11-24 LAB — HCG, SERUM, QUALITATIVE: Preg, Serum: NEGATIVE

## 2021-11-24 LAB — PROTIME-INR
INR: 1.1 (ref 0.8–1.2)
Prothrombin Time: 14 seconds (ref 11.4–15.2)

## 2021-11-25 ENCOUNTER — Ambulatory Visit (HOSPITAL_COMMUNITY): Admit: 2021-11-25 | Payer: Medicaid Other | Admitting: Gastroenterology

## 2021-11-25 ENCOUNTER — Ambulatory Visit (HOSPITAL_COMMUNITY): Payer: Medicaid Other | Admitting: Anesthesiology

## 2021-11-25 ENCOUNTER — Encounter (HOSPITAL_COMMUNITY)
Admission: RE | Disposition: A | Discharge status: Home / Self Care | Payer: Self-pay | Source: Home / Self Care | Attending: Gastroenterology

## 2021-11-25 ENCOUNTER — Encounter (HOSPITAL_COMMUNITY): Payer: Self-pay

## 2021-11-25 ENCOUNTER — Encounter (HOSPITAL_COMMUNITY): Payer: Self-pay | Admitting: Gastroenterology

## 2021-11-25 ENCOUNTER — Ambulatory Visit (HOSPITAL_COMMUNITY)
Admission: RE | Admit: 2021-11-25 | Discharge: 2021-11-25 | Disposition: A | Discharge status: Home / Self Care | Payer: Medicaid Other | Attending: Gastroenterology | Admitting: Gastroenterology

## 2021-11-25 DIAGNOSIS — F32A Depression, unspecified: Secondary | ICD-10-CM | POA: Diagnosis not present

## 2021-11-25 DIAGNOSIS — E1165 Type 2 diabetes mellitus with hyperglycemia: Secondary | ICD-10-CM | POA: Diagnosis not present

## 2021-11-25 DIAGNOSIS — M329 Systemic lupus erythematosus, unspecified: Secondary | ICD-10-CM | POA: Diagnosis not present

## 2021-11-25 DIAGNOSIS — K2211 Ulcer of esophagus with bleeding: Secondary | ICD-10-CM | POA: Insufficient documentation

## 2021-11-25 DIAGNOSIS — R1310 Dysphagia, unspecified: Secondary | ICD-10-CM | POA: Diagnosis not present

## 2021-11-25 DIAGNOSIS — Z9049 Acquired absence of other specified parts of digestive tract: Secondary | ICD-10-CM | POA: Diagnosis not present

## 2021-11-25 DIAGNOSIS — Z01818 Encounter for other preprocedural examination: Secondary | ICD-10-CM

## 2021-11-25 DIAGNOSIS — I868 Varicose veins of other specified sites: Secondary | ICD-10-CM | POA: Insufficient documentation

## 2021-11-25 DIAGNOSIS — K449 Diaphragmatic hernia without obstruction or gangrene: Secondary | ICD-10-CM | POA: Insufficient documentation

## 2021-11-25 DIAGNOSIS — K921 Melena: Secondary | ICD-10-CM | POA: Insufficient documentation

## 2021-11-25 DIAGNOSIS — Z01812 Encounter for preprocedural laboratory examination: Secondary | ICD-10-CM

## 2021-11-25 DIAGNOSIS — F17210 Nicotine dependence, cigarettes, uncomplicated: Secondary | ICD-10-CM | POA: Diagnosis not present

## 2021-11-25 DIAGNOSIS — K649 Unspecified hemorrhoids: Secondary | ICD-10-CM | POA: Diagnosis not present

## 2021-11-25 DIAGNOSIS — R161 Splenomegaly, not elsewhere classified: Secondary | ICD-10-CM | POA: Insufficient documentation

## 2021-11-25 DIAGNOSIS — J449 Chronic obstructive pulmonary disease, unspecified: Secondary | ICD-10-CM | POA: Insufficient documentation

## 2021-11-25 DIAGNOSIS — K3189 Other diseases of stomach and duodenum: Secondary | ICD-10-CM | POA: Insufficient documentation

## 2021-11-25 DIAGNOSIS — K7460 Unspecified cirrhosis of liver: Secondary | ICD-10-CM | POA: Diagnosis not present

## 2021-11-25 DIAGNOSIS — K644 Residual hemorrhoidal skin tags: Secondary | ICD-10-CM | POA: Diagnosis not present

## 2021-11-25 DIAGNOSIS — K254 Chronic or unspecified gastric ulcer with hemorrhage: Secondary | ICD-10-CM | POA: Insufficient documentation

## 2021-11-25 DIAGNOSIS — B182 Chronic viral hepatitis C: Secondary | ICD-10-CM

## 2021-11-25 DIAGNOSIS — I10 Essential (primary) hypertension: Secondary | ICD-10-CM

## 2021-11-25 DIAGNOSIS — K766 Portal hypertension: Secondary | ICD-10-CM | POA: Diagnosis not present

## 2021-11-25 DIAGNOSIS — Z79899 Other long term (current) drug therapy: Secondary | ICD-10-CM | POA: Insufficient documentation

## 2021-11-25 DIAGNOSIS — F419 Anxiety disorder, unspecified: Secondary | ICD-10-CM | POA: Diagnosis not present

## 2021-11-25 DIAGNOSIS — Z79891 Long term (current) use of opiate analgesic: Secondary | ICD-10-CM | POA: Insufficient documentation

## 2021-11-25 DIAGNOSIS — E119 Type 2 diabetes mellitus without complications: Secondary | ICD-10-CM

## 2021-11-25 DIAGNOSIS — K7581 Nonalcoholic steatohepatitis (NASH): Secondary | ICD-10-CM | POA: Diagnosis not present

## 2021-11-25 DIAGNOSIS — K219 Gastro-esophageal reflux disease without esophagitis: Secondary | ICD-10-CM | POA: Diagnosis not present

## 2021-11-25 DIAGNOSIS — R1013 Epigastric pain: Secondary | ICD-10-CM | POA: Insufficient documentation

## 2021-11-25 DIAGNOSIS — K259 Gastric ulcer, unspecified as acute or chronic, without hemorrhage or perforation: Secondary | ICD-10-CM | POA: Diagnosis not present

## 2021-11-25 DIAGNOSIS — B942 Sequelae of viral hepatitis: Secondary | ICD-10-CM | POA: Diagnosis not present

## 2021-11-25 HISTORY — PX: ESOPHAGOGASTRODUODENOSCOPY (EGD) WITH PROPOFOL: SHX5813

## 2021-11-25 HISTORY — PX: BIOPSY: SHX5522

## 2021-11-25 HISTORY — PX: COLONOSCOPY WITH PROPOFOL: SHX5780

## 2021-11-25 LAB — CBC WITH DIFFERENTIAL/PLATELET
Abs Immature Granulocytes: 0.01 10*3/uL (ref 0.00–0.07)
Basophils Absolute: 0 10*3/uL (ref 0.0–0.1)
Basophils Relative: 0 %
Eosinophils Absolute: 0.2 10*3/uL (ref 0.0–0.5)
Eosinophils Relative: 4 %
HCT: 44.3 % (ref 36.0–46.0)
Hemoglobin: 14.8 g/dL (ref 12.0–15.0)
Immature Granulocytes: 0 %
Lymphocytes Relative: 13 %
Lymphs Abs: 0.7 10*3/uL (ref 0.7–4.0)
MCH: 32 pg (ref 26.0–34.0)
MCHC: 33.4 g/dL (ref 30.0–36.0)
MCV: 95.9 fL (ref 80.0–100.0)
Monocytes Absolute: 0.4 10*3/uL (ref 0.1–1.0)
Monocytes Relative: 8 %
Neutro Abs: 3.8 10*3/uL (ref 1.7–7.7)
Neutrophils Relative %: 75 %
Platelets: 70 10*3/uL — ABNORMAL LOW (ref 150–400)
RBC: 4.62 MIL/uL (ref 3.87–5.11)
RDW: 14.3 % (ref 11.5–15.5)
WBC: 5.2 10*3/uL (ref 4.0–10.5)
nRBC: 0 % (ref 0.0–0.2)

## 2021-11-25 LAB — GLUCOSE, CAPILLARY
Glucose-Capillary: 156 mg/dL — ABNORMAL HIGH (ref 70–99)
Glucose-Capillary: 130 mg/dL — ABNORMAL HIGH (ref 70–99)
Glucose-Capillary: 32 mg/dL — CL (ref 70–99)

## 2021-11-25 SURGERY — COLONOSCOPY WITH PROPOFOL
Anesthesia: General

## 2021-11-25 SURGERY — COLONOSCOPY WITH PROPOFOL
Anesthesia: Monitor Anesthesia Care

## 2021-11-25 MED ORDER — DEXTROSE 50 % IV SOLN
INTRAVENOUS | Status: DC | PRN
  Administered 2021-11-25: 1 via INTRAVENOUS

## 2021-11-25 MED ORDER — PROPOFOL 500 MG/50ML IV EMUL
INTRAVENOUS | Status: DC | PRN
  Administered 2021-11-25: 75 ug/kg/min via INTRAVENOUS

## 2021-11-25 MED ORDER — SODIUM CHLORIDE 0.9 % IV SOLN: INTRAVENOUS | Status: DC

## 2021-11-25 MED ORDER — LACTATED RINGERS IV SOLN
INTRAVENOUS | Status: DC
  Administered 2021-11-25: 07:00:00 1000 mL via INTRAVENOUS

## 2021-11-25 MED ORDER — DEXTROSE 50 % IV SOLN
INTRAVENOUS | Status: AC
  Filled 2021-11-25: qty 50

## 2021-11-25 MED ORDER — PROPOFOL 10 MG/ML IV BOLUS
INTRAVENOUS | Status: DC | PRN
  Administered 2021-11-25: 20 mg via INTRAVENOUS
  Administered 2021-11-25: 150 mg via INTRAVENOUS
  Administered 2021-11-25: 50 mg via INTRAVENOUS
  Administered 2021-11-25: 20 mg via INTRAVENOUS

## 2021-11-25 MED ORDER — LIDOCAINE HCL (CARDIAC) PF 50 MG/5ML IV SOSY
PREFILLED_SYRINGE | INTRAVENOUS | Status: DC | PRN
Start: 2021-11-25 — End: 2021-11-25
  Administered 2021-11-25: 50 mg via INTRAVENOUS

## 2021-11-25 MED ORDER — OMEPRAZOLE 40 MG PO CPDR: 40.0000 mg | DELAYED_RELEASE_CAPSULE | Freq: Two times a day (BID) | ORAL | 0 refills | Status: DC

## 2021-11-25 NOTE — Anesthesia Procedure Notes (Signed)
Date/Time: 11/25/2021 9:22 AM Performed by: Vista Deck, CRNA Pre-anesthesia Checklist: Patient identified, Emergency Drugs available, Suction available, Timeout performed and Patient being monitored Patient Re-evaluated:Patient Re-evaluated prior to induction Oxygen Delivery Method: Non-rebreather mask

## 2021-11-25 NOTE — Anesthesia Postprocedure Evaluation (Signed)
Anesthesia Post Note  Patient: Jaime [name]  Procedure(s) Performed: COLONOSCOPY WITH PROPOFOL ESOPHAGOGASTRODUODENOSCOPY (EGD) WITH PROPOFOL BIOPSY  Patient location during evaluation: Phase II Anesthesia Type: General Level of consciousness: awake Pain management: pain level controlled Vital Signs Assessment: post-procedure vital signs reviewed and stable Respiratory status: spontaneous breathing and respiratory function stable Cardiovascular status: blood pressure returned to baseline and stable Postop Assessment: no headache and no apparent nausea or vomiting Anesthetic complications: no Comments: Late entry   No notable events documented.   Last Vitals:  Vitals:   11/25/21 0712 11/25/21 0951  BP: 129/77 136/68  Pulse: 96 81  Resp: 18 (!) 22  Temp: 37.2 C 36.8 C  SpO2: 94% 94%    Last Pain:  Vitals:   11/25/21 0951  TempSrc: Axillary  PainSc: [place]

## 2021-11-25 NOTE — Anesthesia Preprocedure Evaluation (Signed)
Anesthesia Evaluation  Patient identified by MRN, date of birth, ID band Patient awake    Reviewed: Allergy & Precautions, H&P , NPO status , Patient's Chart, lab work & pertinent test results, reviewed documented beta blocker date and time   Airway Mallampati: II  TM Distance: >3 FB Neck ROM: full    Dental no notable dental hx.    Pulmonary shortness of breath, asthma , COPD,  COPD inhaler, Current Smoker and Patient abstained from smoking.,    Pulmonary exam normal breath sounds clear to auscultation       Cardiovascular Exercise Tolerance: Good negative cardio ROS   Rhythm:regular Rate:Normal     Neuro/Psych  Headaches, Seizures -, Well Controlled,  PSYCHIATRIC DISORDERS Anxiety Depression    GI/Hepatic PUD, GERD  ,(+) Hepatitis -, C  Endo/Other  diabetes, Type 2Morbid obesity  Renal/GU negative Renal ROS  negative genitourinary   Musculoskeletal   Abdominal   Peds  Hematology negative hematology ROS (+)   Anesthesia Other Findings   Reproductive/Obstetrics negative OB ROS                             Anesthesia Physical Anesthesia Plan  ASA: 3  Anesthesia Plan: General   Post-op Pain Management:    Induction:   PONV Risk Score and Plan: Propofol infusion  Airway Management Planned:   Additional Equipment:   Intra-op Plan:   Post-operative Plan:   Informed Consent: I have reviewed the patients History and Physical, chart, labs and discussed the procedure including the risks, benefits and alternatives for the proposed anesthesia with the patient or authorized representative who has indicated his/her understanding and acceptance.     Dental Advisory Given  Plan Discussed with: CRNA  Anesthesia Plan Comments:         Anesthesia Quick Evaluation

## 2021-11-25 NOTE — Interval H&P Note (Signed)
History and Physical Interval Note:  11/25/2021 7:28 AM  Jaime [name]  has presented today for surgery, with the diagnosis of Abdominal Pain Rectal Bleeding Nausea Dysphagia.  The various methods of treatment have been discussed with the patient and family. After consideration of risks, benefits and other options for treatment, the patient has consented to  Procedure(s) with comments: COLONOSCOPY WITH PROPOFOL (N/A) - 805 ESOPHAGOGASTRODUODENOSCOPY (EGD) WITH PROPOFOL (N/A) as a surgical intervention.  The patient's history has been reviewed, patient examined, no change in status, stable for surgery.  I have reviewed the patient's chart and labs.  Questions were answered to the patient's satisfaction.     [name] [place]

## 2021-11-25 NOTE — Op Note (Signed)
[facility] Patient Name: Jaime [name] Procedure Date: 11/25/2021 9:37 AM MRN: [id number] Date of Birth: 1986/09/05 Attending MD: [name] ,  CSN: [id number] Age: 36 Admit Type: Outpatient Procedure:                Flexible Sigmoidoscopy Indications:              Hematochezia Providers:                [name], Hughie Closs, RN, Charlsie Quest                            Theda Sers RN, RN, Randa Spike, Technician Referring MD:              Medicines:                Monitored Anesthesia Care Complications:            No immediate complications. Estimated Blood Loss:     Estimated blood loss: none. Procedure:                Pre-Anesthesia Assessment:                           - Prior to the procedure, a History and Physical                            was performed, and patient medications, allergies                            and sensitivities were reviewed. The patient's                            tolerance of previous anesthesia was reviewed.                           - The risks and benefits of the procedure and the                            sedation options and risks were discussed with the                            patient. All questions were answered and informed                            consent was obtained.                           - ASA Grade Assessment: III - A patient with severe                            systemic disease.                           After obtaining informed consent, the scope was                            passed under direct vision.After obtaining informed  consent, the scope was passed under direct vision.                            The PCF-HQ190L ([id number]) scope was introduced                            through the anus and advanced to the the sigmoid                            colon. The flexible sigmoidoscopy was accomplished                            without difficulty. The patient tolerated the                             procedure well. The quality of the bowel                            preparation was poor. Scope In: 9:41:42 AM Scope Out: 9:42:55 AM Total Procedure Duration: 0 hours 1 minute 13 seconds  Findings:      Hemorrhoids were found on perianal exam.      Extensive amounts of solid stool was found in the rectum and in the       sigmoid colon, precluding visualization. Impression:               - Preparation of the colon was poor.                           - Hemorrhoids found on perianal exam.                           - Stool in the rectum and in the sigmoid colon.                           - No specimens collected. Moderate Sedation:      Per Anesthesia Care Recommendation:           - Discharge patient to home (ambulatory).                           - Resume previous diet.                           - Repeat colonoscopy at age 58 for screening                            purposes (will need 2 day prep and clear liquids                            for 5 days), or earlier if persistent rectal                            bleeding.                           -  Start taking Miralax 1 capful every day for one                            week. If bowel movements do not improve, increase                            to 1 capful every 12 hours. If after two weeks                            there is no improvement, increase to 1 capful every                            8 hours Procedure Code(s):        --- Professional ---                           [phone], Sigmoidoscopy, flexible; diagnostic,                            including collection of specimen(s) by brushing or                            washing, when performed (separate procedure) Diagnosis Code(s):        --- Professional ---                           K64.9, Unspecified hemorrhoids                           K92.1, Melena (includes Hematochezia) CPT copyright 2019 American Medical Association. All rights reserved. The codes  documented in this report are preliminary and upon coder review may  be revised to meet current compliance requirements. [name], MD [name],  11/25/2021 9:51:39 AM This report has been signed electronically. Number of Addenda: 0

## 2021-11-25 NOTE — Transfer of Care (Signed)
Immediate Anesthesia Transfer of Care Note  Patient: Jaime [name]  Procedure(s) Performed: COLONOSCOPY WITH PROPOFOL ESOPHAGOGASTRODUODENOSCOPY (EGD) WITH PROPOFOL BIOPSY  Patient Location: Short Stay  Anesthesia Type:General  Level of Consciousness: drowsy and patient cooperative  Airway & Oxygen Therapy: Patient Spontanous Breathing and Patient connected to nasal cannula oxygen  Post-op Assessment: Report given to RN and Post -op Vital signs reviewed and stable  Post vital signs: Reviewed and stable  Last Vitals:  Vitals Value Taken Time  BP 136/68 11/25/21 0951  Temp 36.8 C 11/25/21 0951  Pulse 81 11/25/21 0951  Resp 22 11/25/21 0951  SpO2 94 % 11/25/21 0951    Last Pain:  Vitals:   11/25/21 0951  TempSrc: Axillary  PainSc: Asleep      Patients Stated Pain Goal: 8 (13/88/71 9597)  Complications: Blood Sugar 32 Dextrose given

## 2021-11-25 NOTE — Discharge Instructions (Addendum)
You are being discharged to home.  Resume your previous diet.  We are waiting for your pathology results.  Take Prilosec (omeprazole) 40 mg by mouth twice a day.  Continue sucralfate every 6 hours. Stop pantoprazole. Repeat colonoscopy at age 36 for screening purposes (will need 2 day prep and clear liquids for 5 days), or earlier if persistent rectal bleeding. Start taking Miralax 1 capful every day for one week. If bowel movements do not improve, increase to 1 capful every 12 hours. If after two weeks there is no improvement, increase to 1 capful every 8 hours Repeat EGD in 3 months

## 2021-11-25 NOTE — Op Note (Addendum)
[facility] Patient Name: Jaime [name] Procedure Date: 11/25/2021 8:55 AM MRN: [id number] Date of Birth: Jun 17, 1986 Attending MD: [name] ,  CSN: [id number] Age: 36 Admit Type: Outpatient Procedure:                Upper GI endoscopy Indications:              Epigastric abdominal pain, Dysphagia, Melena Providers:                [name], Hughie Closs, RN, Lambert Mody, Randa Spike, Technician Referring MD:              Medicines:                Monitored Anesthesia Care Complications:            No immediate complications. Estimated Blood Loss:     Estimated blood loss: none. Procedure:                Pre-Anesthesia Assessment:                           - Prior to the procedure, a History and Physical                            was performed, and patient medications, allergies                            and sensitivities were reviewed. The patient's                            tolerance of previous anesthesia was reviewed.                           - The risks and benefits of the procedure and the                            sedation options and risks were discussed with the                            patient. All questions were answered and informed                            consent was obtained.                           - ASA Grade Assessment: III - A patient with severe                            systemic disease.                           After obtaining informed consent, the endoscope was                            passed under direct vision. Throughout the  procedure, the patient's blood pressure, pulse, and                            oxygen saturations were monitored continuously. The                            GIF-H190 ([id number]) scope was introduced through the                            mouth, and advanced to the second part of duodenum.                            The upper GI endoscopy was  accomplished without                            difficulty. The patient tolerated the procedure                            well. Scope In: 9:27:39 AM Scope Out: 9:35:06 AM Total Procedure Duration: 0 hours 7 minutes 27 seconds  Findings:      No endoscopic abnormality was evident in the esophagus to explain the       patient's complaint of dysphagia. Upon careful inspection, no varices       were observed.      Mild portal hypertensive gastropathy was found in the entire examined       stomach. Upon careful inspection, no gastric varices were observed.      Five non-bleeding cratered gastric ulcers with a clean ulcer base       (Forrest Class III) were found in the gastric antrum. The largest lesion       was 6 mm in largest dimension. Biopsies from body and antrum were taken       with a cold forceps for Helicobacter pylori testing.      The examined duodenum was normal. Biopsies were taken with a cold       forceps for histology.      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                           - Portal hypertensive gastropathy.                           - Non-bleeding gastric ulcers with a clean ulcer                            base (Forrest Class III). Biopsied.                           - Normal examined duodenum. Biopsied. Moderate Sedation:      Per Anesthesia Care Recommendation:           - Discharge patient to home (  ambulatory).                           - Resume previous diet.                           - Await pathology results.                           - Use Prilosec (omeprazole) 40 mg PO BID.                           - Stop pantoprazole.                           - Continue sucralfate every 6 hours.                           - Check h. pylori serology.                           - Repeat upper endoscopy in 3 months for                             surveillance. Procedure Code(s):        --- Professional ---                           [phone], Esophagogastroduodenoscopy, flexible,                            transoral; with biopsy, single or multiple Diagnosis Code(s):        --- Professional ---                           R13.10, Dysphagia, unspecified                           K76.6, Portal hypertension                           K31.89, Other diseases of stomach and duodenum                           K25.9, Gastric ulcer, unspecified as acute or                            chronic, without hemorrhage or perforation                           R10.13, Epigastric pain                           K92.1, Melena (includes Hematochezia) CPT copyright 2019 [employer]. All rights reserved. The codes documented in this report are preliminary and upon coder review may  be revised to meet current compliance requirements. [name], MD [name],  11/25/2021 9:46:31 AM This report has been signed electronically. Number of Addenda: 0

## 2021-11-25 NOTE — Progress Notes (Signed)
Date and time results received: 11/25/21 0954 (use smartphrase ".now" to insert current time)  Test: glucose  Critical Value: 32  Name of Provider Notified: Jenetta Downer MD @ [phone]  Orders Received? Or Actions Taken?: Actions Taken: 25g Dextrose

## 2021-11-26 LAB — H. PYLORI ANTIBODY, IGG: H Pylori IgG: 0.11 Index Value (ref 0.00–0.79)

## 2021-11-26 LAB — SURGICAL PATHOLOGY

## 2021-11-27 ENCOUNTER — Encounter (HOSPITAL_COMMUNITY): Payer: Self-pay | Admitting: Gastroenterology

## 2021-12-01 ENCOUNTER — Other Ambulatory Visit: Payer: Self-pay

## 2021-12-01 DIAGNOSIS — E1165 Type 2 diabetes mellitus with hyperglycemia: Secondary | ICD-10-CM

## 2021-12-01 MED ORDER — NOVOLOG FLEXPEN 100 UNIT/ML ~~LOC~~ SOPN: 30.0000 [IU] | PEN_INJECTOR | Freq: Three times a day (TID) | SUBCUTANEOUS | 1 refills | Status: DC

## 2021-12-01 MED ORDER — ACCU-CHEK GUIDE VI STRP: ORAL_STRIP | 5 refills | Status: DC

## 2021-12-01 MED ORDER — TOUJEO MAX SOLOSTAR 300 UNIT/ML ~~LOC~~ SOPN: 120.0000 [IU] | PEN_INJECTOR | Freq: Every day | SUBCUTANEOUS | 3 refills | Status: DC

## 2021-12-01 MED ORDER — METFORMIN HCL 1000 MG PO TABS: 1000.0000 mg | ORAL_TABLET | Freq: Two times a day (BID) | ORAL | 1 refills | Status: DC

## 2021-12-01 MED ORDER — GLIPIZIDE ER 5 MG PO TB24: 5.0000 mg | ORAL_TABLET | Freq: Every day | ORAL | 1 refills | Status: DC

## 2021-12-01 MED ORDER — LISINOPRIL 5 MG PO TABS: 5.0000 mg | ORAL_TABLET | Freq: Every day | ORAL | 1 refills | Status: DC

## 2021-12-01 MED ORDER — VICTOZA 18 MG/3ML ~~LOC~~ SOPN: 1.8000 mg | PEN_INJECTOR | Freq: Every day | SUBCUTANEOUS | 3 refills | Status: DC

## 2021-12-01 MED ORDER — DEXCOM G6 SENSOR MISC: 4.0000 | 2 refills | Status: DC

## 2021-12-04 LAB — COMPREHENSIVE METABOLIC PANEL WITH GFR
ALT: 32 IU/L (ref 0–32)
AST: 42 IU/L — ABNORMAL HIGH (ref 0–40)
Albumin/Globulin Ratio: 1.1 — ABNORMAL LOW (ref 1.2–2.2)
Albumin: 3 g/dL — ABNORMAL LOW (ref 3.8–4.8)
Alkaline Phosphatase: 151 IU/L — ABNORMAL HIGH (ref 44–121)
BUN/Creatinine Ratio: 9 (ref 9–23)
BUN: 6 mg/dL (ref 6–20)
Bilirubin Total: 0.6 mg/dL (ref 0.0–1.2)
CO2: 23 mmol/L (ref 20–29)
Calcium: 8.6 mg/dL — ABNORMAL LOW (ref 8.7–10.2)
Chloride: 104 mmol/L (ref 96–106)
Creatinine, Ser: 0.64 mg/dL (ref 0.57–1.00)
Globulin, Total: 2.8 g/dL (ref 1.5–4.5)
Glucose: 274 mg/dL — ABNORMAL HIGH (ref 70–99)
Potassium: 3.9 mmol/L (ref 3.5–5.2)
Sodium: 140 mmol/L (ref 134–144)
Total Protein: 5.8 g/dL — ABNORMAL LOW (ref 6.0–8.5)
eGFR: 117 mL/min/1.73

## 2021-12-04 LAB — LIPID PANEL
Chol/HDL Ratio: 4 ratio (ref 0.0–4.4)
Cholesterol, Total: 182 mg/dL (ref 100–199)
HDL: 46 mg/dL
LDL Chol Calc (NIH): 103 mg/dL — ABNORMAL HIGH (ref 0–99)
Triglycerides: 190 mg/dL — ABNORMAL HIGH (ref 0–149)
VLDL Cholesterol Cal: 33 mg/dL (ref 5–40)

## 2021-12-04 LAB — VITAMIN D 25 HYDROXY (VIT D DEFICIENCY, FRACTURES): Vit D, 25-Hydroxy: 9.8 ng/mL — ABNORMAL LOW (ref 30.0–100.0)

## 2021-12-04 LAB — T4, FREE: Free T4: 0.7 ng/dL — ABNORMAL LOW (ref 0.82–1.77)

## 2021-12-04 LAB — TSH: TSH: 2.7 u[IU]/mL (ref 0.450–4.500)

## 2021-12-08 ENCOUNTER — Other Ambulatory Visit: Payer: Self-pay | Admitting: Nurse Practitioner

## 2021-12-09 NOTE — Patient Instructions (Signed)
Exercising to Stay Healthy °To become healthy and stay healthy, it is recommended that you do moderate-intensity and vigorous-intensity exercise. You can tell that you are exercising at a moderate intensity if your heart starts beating faster and you start breathing faster but can still hold a conversation. You can tell that you are exercising at a vigorous intensity if you are breathing much harder and faster and cannot hold a conversation while exercising. °How can exercise benefit me? °Exercising regularly is important. It has many health benefits, such as: °Improving overall fitness, flexibility, and endurance. °Increasing bone density. °Helping with weight control. °Decreasing body fat. °Increasing muscle strength and endurance. °Reducing stress and tension, anxiety, depression, or anger. °Improving overall health. °What guidelines should I follow while exercising? °Before you start a new exercise program, talk with your health care provider. °Do not exercise so much that you hurt yourself, feel dizzy, or get very short of breath. °Wear comfortable clothes and wear shoes with good support. °Drink plenty of water while you exercise to prevent dehydration or heat stroke. °Work out until your breathing and your heartbeat get faster (moderate intensity). °How often should I exercise? °Choose an activity that you enjoy, and set realistic goals. Your health care provider can help you make an activity plan that is individually designed and works best for you. °Exercise regularly as told by your health care provider. This may include: °Doing strength training two times a week, such as: °Lifting weights. °Using resistance bands. °Push-ups. °Sit-ups. °Yoga. °Doing a certain intensity of exercise for a given amount of time. Choose from these options: °A total of 150 minutes of moderate-intensity exercise every week. °A total of 75 minutes of vigorous-intensity exercise every week. °A mix of moderate-intensity and  vigorous-intensity exercise every week. °Children, pregnant women, people who have not exercised regularly, people who are overweight, and older adults may need to talk with a health care provider about what activities are safe to perform. If you have a medical condition, be sure to talk with your health care provider before you start a new exercise program. °What are some exercise ideas? °Moderate-intensity exercise ideas include: °Walking 1 mile (1.6 km) in about 15 minutes. °Biking. °Hiking. °Golfing. °Dancing. °Water aerobics. °Vigorous-intensity exercise ideas include: °Walking 4.5 miles (7.2 km) or more in about 1 hour. °Jogging or running 5 miles (8 km) in about 1 hour. °Biking 10 miles (16.1 km) or more in about 1 hour. °Lap swimming. °Roller-skating or in-line skating. °Cross-country skiing. °Vigorous competitive sports, such as football, basketball, and soccer. °Jumping rope. °Aerobic dancing. °What are some everyday activities that can help me get exercise? °Yard work, such as: °Pushing a lawn mower. °Raking and bagging leaves. °Washing your car. °Pushing a stroller. °Shoveling snow. °Gardening. °Washing windows or floors. °How can I be more active in my day-to-day activities? °Use stairs instead of an elevator. °Take a walk during your lunch break. °If you drive, park your car farther away from your work or school. °If you take public transportation, get off one stop early and walk the rest of the way. °Stand up or walk around during all of your indoor phone calls. °Get up, stretch, and walk around every 30 minutes throughout the day. °Enjoy exercise with a friend. Support to continue exercising will help you keep a regular routine of activity. °Where to find more information °You can find more information about exercising to stay healthy from: °[place] Department of Health and Human Services: www.hhs.gov °Centers for Disease Control and Prevention (  CDC): www.cdc.gov °Summary °Exercising regularly is  important. It will improve your overall fitness, flexibility, and endurance. °Regular exercise will also improve your overall health. It can help you control your weight, reduce stress, and improve your bone density. °Do not exercise so much that you hurt yourself, feel dizzy, or get very short of breath. °Before you start a new exercise program, talk with your health care provider. °This information is not intended to replace advice given to you by your health care provider. Make sure you discuss any questions you have with your health care provider. °Document Revised: 02/14/2021 Document Reviewed: 02/14/2021 °[employer] Patient Education © 2022 [employer] ° °

## 2021-12-10 ENCOUNTER — Ambulatory Visit (INDEPENDENT_AMBULATORY_CARE_PROVIDER_SITE_OTHER): Payer: Medicaid Other | Admitting: Nurse Practitioner

## 2021-12-10 ENCOUNTER — Other Ambulatory Visit: Payer: Self-pay

## 2021-12-10 ENCOUNTER — Encounter: Payer: Self-pay | Admitting: Nurse Practitioner

## 2021-12-10 VITALS — BP 128/77 | HR 88 | Ht 70.0 in | Wt 293.0 lb

## 2021-12-10 DIAGNOSIS — E1165 Type 2 diabetes mellitus with hyperglycemia: Secondary | ICD-10-CM | POA: Diagnosis not present

## 2021-12-10 DIAGNOSIS — E782 Mixed hyperlipidemia: Secondary | ICD-10-CM | POA: Diagnosis not present

## 2021-12-10 DIAGNOSIS — Z91119 Patient's noncompliance with dietary regimen due to unspecified reason: Secondary | ICD-10-CM

## 2021-12-10 LAB — POCT UA - MICROALBUMIN
Creatinine, POC: 50 mg/dL
Microalbumin Ur, POC: 30 mg/L

## 2021-12-10 MED ORDER — VITAMIN D (ERGOCALCIFEROL) 1.25 MG (50000 UNIT) PO CAPS: 50000.0000 [IU] | ORAL_CAPSULE | ORAL | 0 refills | Status: DC

## 2021-12-10 MED ORDER — HUMULIN R U-500 KWIKPEN 500 UNIT/ML ~~LOC~~ SOPN: 70.0000 [IU] | PEN_INJECTOR | Freq: Three times a day (TID) | SUBCUTANEOUS | 2 refills | Status: DC

## 2021-12-10 NOTE — Progress Notes (Signed)
12/10/2021, 10:52 AM     Endocrinology Follow Up Visit  Subjective:    Patient ID: Jaime [name], female    DOB: January 29, 1986.  Jaime [name] is being seen in follow up for management of currently uncontrolled symptomatic diabetes requested by  Jaime Burly, MD.   Past Medical History:  Diagnosis Date   Anxiety    Asthma    Chronic abdominal pain    Chronic back pain    Cirrhosis (Jaime [name])    Cirrhosis (Jaime [name])    COPD (chronic obstructive pulmonary disease) (Jaime [name])    Depression    Diabetes mellitus without complication (Jaime [name])    Diabetes mellitus, type II (Jaime [name])    Hepatitis C    Hyperlipidemia    Insomnia    Long-term current use of methadone for opiate dependence ([place])    Lupus (Jaime [name])    Migraine headache    Neuropathy    Nocturnal seizures (Jaime [name])    Peptic ulcer    Spleen enlarged     Past Surgical History:  Procedure Laterality Date   BIOPSY  11/25/2021   Procedure: BIOPSY;  Surgeon: Harvel Quale, MD;  [facility]: [facility];  Service: Gastroenterology;;   CHOLECYSTECTOMY     COLONOSCOPY WITH PROPOFOL N/A 11/25/2021   Procedure: COLONOSCOPY WITH PROPOFOL;  Surgeon: Harvel Quale, MD;  Location: [facility];  Service: Gastroenterology;  Laterality: N/A;  33   ESOPHAGOGASTRODUODENOSCOPY  06/2020   done at [facility], candida in upper esophagus (treated with diflucan), ulcerative esophagitis at GE junction, gastritis in stomach, single ulcer in duodenal bulb, with duodenal mucosa showing no abnormality. No presence of varices   ESOPHAGOGASTRODUODENOSCOPY (EGD) WITH PROPOFOL N/A 11/25/2021   Procedure: ESOPHAGOGASTRODUODENOSCOPY (EGD) WITH PROPOFOL;  Surgeon: Harvel Quale, MD;  Location: [facility];  Service: Gastroenterology;  Laterality: N/A;    Social History   Socioeconomic History   Marital status: Single    Spouse name: Not on file   Number of  children: Not on file   Years of education: Not on file   Highest education level: Not on file  Occupational History   Not on file  Tobacco Use   Smoking status: Every Day    Types: E-cigarettes   Smokeless tobacco: Never  Vaping Use   Vaping Use: Every day   Substances: Nicotine, Flavoring  Substance and Sexual Activity   Alcohol use: No   Drug use: No   Sexual activity: Not Currently    Birth control/protection: Implant  Other Topics Concern   Not on file  Social History Narrative   Not on file   Social Determinants of Health   Financial Resource Strain: Not on file  Food Insecurity: Not on file  Transportation Needs: Not on file  Physical Activity: Not on file  Stress: Not on file  Social Connections: Not on file    Family History  Problem Relation Age of Onset   Hypertension Mother    Hyperlipidemia Mother    Hyperlipidemia Maternal Grandfather     Outpatient Encounter Medications as of 12/10/2021  Medication Sig   insulin regular human CONCENTRATED (HUMULIN R  U-500 KWIKPEN) 500 UNIT/ML KwikPen Inject 70 Units into the skin 3 (three) times daily with meals.   Vitamin D, Ergocalciferol, (DRISDOL) 1.25 MG (50000 UNIT) CAPS capsule Take 1 capsule (50,000 Units total) by mouth every 7 (seven) days.   Accu-Chek Softclix Lancets lancets USE TO TEST BLOOD SUGAR 4 TIMES A DAY.   albuterol (ACCUNEB) 0.63 MG/3ML nebulizer solution Take 1 ampule by nebulization every 6 (six) hours as needed for wheezing.   ARIPiprazole (ABILIFY) 20 MG tablet Take 20 mg by mouth daily.   aspirin EC 81 MG tablet Take 81 mg by mouth daily. Swallow whole.   blood glucose meter kit and supplies Dispense based on patient and insurance preference. Use up to four times daily as directed. (FOR ICD-10 E10.9, E11.9).   cetirizine (ZYRTEC ALLERGY) 10 MG tablet Take 1 tablet (10 mg total) by mouth daily.   clonazePAM (KLONOPIN) 0.5 MG tablet Take 1 tablet (0.5 mg total) by mouth 2 (two) times daily as  needed for anxiety. (Patient taking differently: Take 0.5 mg by mouth 2 (two) times daily.)   Continuous Blood Gluc Sensor (DEXCOM G6 SENSOR) MISC 4 Pieces by Does not apply route once a week.   Continuous Blood Gluc Transmit (DEXCOM G6 TRANSMITTER) MISC USE AS DIRECTED.   cromolyn (OPTICROM) 4 % ophthalmic solution Place 1 drop into both eyes 2 (two) times daily.   etonogestrel (NEXPLANON) 68 MG IMPL implant 1 each by Subdermal route once.   furosemide (LASIX) 40 MG tablet Take 40 mg by mouth daily.   glipiZIDE (GLUCOTROL XL) 5 MG 24 hr tablet Take 1 tablet (5 mg total) by mouth daily with breakfast.   glucose blood (ACCU-CHEK GUIDE) test strip Use as instructed to check blood glucose four times daily   hyoscyamine (ANASPAZ) 0.125 MG TBDP disintergrating tablet TAKE 1 TABLET UNDER THE TONGUE THREE TIMES A DAY AS NEEDED FOR BLADDER SPASMS OR CRAMPING.   Insulin Pen Needle (GLOBAL EASE INJECT PEN NEEDLES) 31G X 8 MM MISC Use 1 pen needle 4 times daily for insulin.   Insulin Pen Needle (PEN NEEDLES) 32G X 6 MM MISC 1 each by Does not apply route 3 (three) times daily.   liraglutide (VICTOZA) 18 MG/3ML SOPN Inject 1.8 mg into the skin daily.   lisinopril (ZESTRIL) 5 MG tablet Take 1 tablet (5 mg total) by mouth daily.   metFORMIN (GLUCOPHAGE) 1000 MG tablet Take 1 tablet (1,000 mg total) by mouth 2 (two) times daily with a meal.   methadone (DOLOPHINE) 10 MG/5ML solution Take 60 mg by mouth every morning.   Multiple Vitamin (MULTIVITAMIN) tablet Take 1 tablet by mouth daily.   nystatin (MYCOSTATIN/NYSTOP) powder Apply 1 application topically 3 (three) times daily.   omeprazole (PRILOSEC) 40 MG capsule Take 1 capsule (40 mg total) by mouth 2 (two) times daily.   ondansetron (ZOFRAN) 4 MG tablet Take 1 tablet (4 mg total) by mouth every 8 (eight) hours as needed for nausea or vomiting.   potassium chloride (KLOR-CON M) 10 MEQ tablet Take 10 mEq by mouth 2 (two) times daily.   pregabalin (LYRICA) 75 MG  capsule Take 75 mg by mouth in the morning, at noon, in the evening, and at bedtime.    PROAIR HFA 108 (90 Base) MCG/ACT inhaler Inhale 1-2 puffs into the lungs every 6 (six) hours as needed for wheezing or shortness of breath.   propranolol (INDERAL) 10 MG tablet Take 10 mg by mouth 3 (three) times daily.   RESTASIS 0.05 %  ophthalmic emulsion Place 1 drop into both eyes as needed (dry eye).    rosuvastatin (CRESTOR) 5 MG tablet Take 1 tablet (5 mg total) by mouth daily.   spironolactone (ALDACTONE) 100 MG tablet Take 100 mg by mouth daily.    sucralfate (CARAFATE) 1 GM/10ML suspension Take 10 mLs (1 g total) by mouth 4 (four) times daily -  with meals and at bedtime.   triamcinolone (KENALOG) 0.1 % Apply 1 application topically daily as needed (irritation).   venlafaxine XR (EFFEXOR-XR) 150 MG 24 hr capsule Take total of 225 mg daily (150 mg + 75 mg )   venlafaxine XR (EFFEXOR-XR) 75 MG 24 hr capsule To take along with 121m for a total of 225 mg daily   [DISCONTINUED] insulin aspart (NOVOLOG FLEXPEN) 100 UNIT/ML FlexPen Inject 30-36 Units into the skin 3 (three) times daily with meals.   [DISCONTINUED] insulin glargine, 2 Unit Dial, (TOUJEO MAX SOLOSTAR) 300 UNIT/ML Solostar Pen Inject 120 Units into the skin at bedtime.   No facility-administered encounter medications on file as of 12/10/2021.    ALLERGIES: Allergies  Allergen Reactions   Iodine-131 Anaphylaxis, Shortness Of Breath and Swelling   Ivp Dye [Iodinated Contrast Media] Anaphylaxis    Skin gets very red, unable to walk    Ketorolac Tromethamine Shortness Of Breath   Tylenol [Acetaminophen] Other (See Comments)    Due to cirrhosis    Gabapentin    Morphine And Related     Pt says she is not allergic to Morphine 08/12/20   Nsaids Other (See Comments)    Flares ulcers   Suboxone [Buprenorphine Hcl-Naloxone Hcl]    Vancomycin Swelling    Facial swelling,     VACCINATION STATUS: Immunization History  Administered Date(s)  Administered   Influenza,inj,Quad PF,6+ Mos 07/18/2016   Moderna Sars-Covid-2 Vaccination 03/07/2020, 04/04/2020   Pneumococcal Polysaccharide-23 07/18/2016, 08/01/2020    Diabetes She presents for her follow-up diabetic visit. She has type 2 diabetes mellitus. Onset time: She was diagnosed at approximate age of 89years. Her disease course has been improving. There are no hypoglycemic associated symptoms. Pertinent negatives for hypoglycemia include no headaches, nervousness/anxiousness or pallor. (Had symptoms of hypoglycemia when glucose dropped to 117.) Associated symptoms include blurred vision, fatigue, foot paresthesias, polydipsia and polyuria. Pertinent negatives for diabetes include no chest pain, no polyphagia and no weight loss. There are no hypoglycemic complications. (History of unresponsiveness requiring EMS and hospitalization- none recent) Symptoms are stable. Diabetic complications include heart disease and peripheral neuropathy. Risk factors for coronary artery disease include diabetes mellitus, obesity, sedentary lifestyle, dyslipidemia and hypertension. Current diabetic treatment includes oral agent (dual therapy) and intensive insulin program (and Victoza). She is compliant with treatment most of the time. Her weight is fluctuating minimally. She is following a low salt and generally unhealthy diet. When asked about meal planning, she reported none. She has had a previous visit with a dietitian. She participates in exercise intermittently (5 days per week). Her home blood glucose trend is decreasing steadily. Her breakfast blood glucose range is generally >200 mg/dl. Her lunch blood glucose range is generally >200 mg/dl. Her dinner blood glucose range is generally >200 mg/dl. Her bedtime blood glucose range is generally >200 mg/dl. Her overall blood glucose range is >200 mg/dl. (She presents today, accompanied by her mom, with her CGM showing slowly improving glycemic profile, yet still  significantly above target.  She was not due for another A1c today.  She has started working out 5 days a  week with her mother and has switched from drinking sweet tea to gatorade.  She was able to have endoscopy done recently which showed gastric ulcer.  She denies any significant hypoglycemia.  Analysis of her CGM shows TIR 3%, TAR 97%, TBR 0% with a GMI of 11.1%.) An ACE inhibitor/angiotensin II receptor blocker is being taken. She does not see a podiatrist.Eye exam is not current.  Hypertension This is a chronic problem. The current episode started more than 1 month ago. The problem is unchanged. The problem is controlled. Associated symptoms include blurred vision and peripheral edema. Pertinent negatives include no chest pain, headaches, palpitations or shortness of breath. There are no associated agents to hypertension. Risk factors for coronary artery disease include diabetes mellitus, dyslipidemia, obesity and sedentary lifestyle. Past treatments include diuretics and beta blockers. The current treatment provides no improvement. There are no compliance problems.     Review of systems  Constitutional: + Minimally fluctuating body weight,  current Body mass index is 42.04 kg/m. , + fatigue-somewhat improved, no subjective hyperthermia, no subjective hypothermia Eyes: no blurry vision, no xerophthalmia ENT: no sore throat, no nodules palpated in throat, no dysphagia/odynophagia, no hoarseness Cardiovascular: no chest pain, no shortness of breath, no palpitations Respiratory: no cough, no shortness of breath Gastrointestinal: no nausea/vomiting/diarrhea Genitourinary: + polyuria- improving Musculoskeletal: no muscle/joint aches Skin: no rashes, no hyperemia Neurological: no tremors, no numbness, no tingling, no dizziness Psychiatric: no depression, no anxiety  Objective:     BP 128/77    Pulse 88    Ht 5' 10"  (1.778 m)    Wt 293 lb (132.9 kg)    BMI 42.04 kg/m   Wt Readings from Last 3  Encounters:  12/10/21 293 lb (132.9 kg)  11/24/21 283 lb 4.8 oz (128.5 kg)  11/17/21 283 lb 4.8 oz (128.5 kg)    BP Readings from Last 3 Encounters:  12/10/21 128/77  11/25/21 136/68  11/17/21 121/81     Physical Exam- Limited  Constitutional:  Body mass index is 42.04 kg/m. , not in acute distress, normal state of mind Eyes:  EOMI, no exophthalmos Neck: Supple Cardiovascular: RRR, no murmurs, rubs, or gallops Respiratory: Adequate breathing efforts, no crackles, rales, rhonchi, or wheezing Musculoskeletal: no gross deformities, strength intact in all four extremities, no gross restriction of joint movements Skin:  no rashes, no hyperemia Neurological: no tremor with outstretched hands   Diabetic Foot Exam - Simple   No data filed    CMP ( most recent) CMP     Component Value Date/Time   NA 140 12/03/2021 0832   K 3.9 12/03/2021 0832   CL 104 12/03/2021 0832   CO2 23 12/03/2021 0832   GLUCOSE 274 (H) 12/03/2021 0832   GLUCOSE 72 11/24/2021 1109   BUN 6 12/03/2021 0832   CREATININE 0.64 12/03/2021 0832   CALCIUM 8.6 (L) 12/03/2021 0832   PROT 5.8 (L) 12/03/2021 0832   ALBUMIN 3.0 (L) 12/03/2021 0832   AST 42 (H) 12/03/2021 0832   ALT 32 12/03/2021 0832   ALKPHOS 151 (H) 12/03/2021 0832   BILITOT 0.6 12/03/2021 0832   GFRNONAA >60 11/24/2021 1109   GFRAA >60 08/01/2020 0704     Diabetic Labs (most recent): Lab Results  Component Value Date   HGBA1C 10.9 (A) 11/05/2021   HGBA1C 11.4 (H) 07/29/2021   HGBA1C 10.3 (A) 05/28/2021     Lipid Panel ( most recent) Lipid Panel     Component Value Date/Time   CHOL 182 12/03/2021  8527   TRIG 190 (H) 12/03/2021 0832   HDL 46 12/03/2021 0832   CHOLHDL 4.0 12/03/2021 0832   LDLCALC 103 (H) 12/03/2021 0832   LABVLDL 33 12/03/2021 0832      Lab Results  Component Value Date   TSH 2.700 12/03/2021   TSH 0.635 09/08/2020   TSH 0.993 07/30/2020   TSH 1.990 07/23/2020   TSH 1.690 08/22/2010   FREET4 0.70 (L)  12/03/2021   FREET4 0.72 (L) 07/23/2020   FREET4 1.11 08/22/2010      Assessment & Plan:   1) Type 2 diabetes mellitus with hyperglycemia, without long-term current use of insulin (HCC)  - Semiyah Newgent has currently uncontrolled symptomatic type 2 DM since  36 years of age.  She presents today, accompanied by her mom, with her CGM showing slowly improving glycemic profile, yet still significantly above target.  She was not due for another A1c today.  She has started working out 5 days a week with her mother and has switched from drinking sweet tea to gatorade.  She was able to have endoscopy done recently which showed gastric ulcer.  She denies any significant hypoglycemia.  Analysis of her CGM shows TIR 3%, TAR 97%, TBR 0% with a GMI of 11.1%.  - I had a long discussion with her about the progressive nature of diabetes and the pathology behind its complications.  -her diabetes is complicated by obesity/sedentary life and she remains at a high risk for more acute and chronic complications which include CAD, CVA, CKD, retinopathy, and neuropathy. These are all discussed in detail with her.  - Nutritional counseling repeated at each appointment due to patients tendency to fall back in to old habits.  - The patient admits there is a room for improvement in their diet and drink choices. -  Suggestion is made for the patient to avoid simple carbohydrates from their diet including Cakes, Sweet Desserts / Pastries, Ice Cream, Soda (diet and regular), Sweet Tea, Candies, Chips, Cookies, Sweet Pastries, Store Bought Juices, Alcohol in Excess of 1-2 drinks a day, Artificial Sweeteners, Coffee Creamer, and "Sugar-free" Products. This will help patient to have stable blood glucose profile and potentially avoid unintended weight gain.   - I encouraged the patient to switch to unprocessed or minimally processed complex starch and increased protein intake (animal or plant source), fruits, and vegetables.    - Patient is advised to stick to a routine mealtimes to eat 3 meals a day and avoid unnecessary snacks (to snack only to correct hypoglycemia).  - I have approached her with the following individualized plan to manage  her diabetes and patient agrees:   -Given her high dose of insulin, she would benefit from switching to Humulin R- U500 to maximize absorption and reduce lipodystrophy.  She agrees with this plan.  She can finish her current supply of Toujeo at 140 units nightly and Humalog 40-46 units TID with meal.  She can continue Victoza 1.8 mg SQ daily, Metformin 1000 mg po twice daily and Glipizide 5 mg XL daily with breakfast.    -She is encouraged to continue to monitor glucose 4 times daily (using her CGM), before meals and before bed and log on the clinic sheets provided.  They are instructed to call the clinic if she has readings less than 70 or greater than 300 for 3 tests in a row.     - Specific targets for  A1c;  LDL, HDL,  and Triglycerides were discussed with the patient.  2) Blood Pressure /Hypertension: Her blood pressure is controlled to target.  She is advised to continue Lasix 80 mg po daily, continue Lisinopril 5 mg po daily, continue Propanolol 10 mg po TID, and Aldactone 100 mg o daily  3) Lipids/Hyperlipidemia:   Review of her recent lipid profile from 12/03/21 shows uncontrolled LDL at 103-improved and elevated triglycerides of 190-improved.  She is advised to continue Crestor 5 mg po daily at bedtime.  Side effects and precautions discussed with her.    4)  Weight/Diet:  Her Body mass index is 42.04 kg/m.  -   clearly complicating her diabetes care.   she is  a candidate for modest weight loss. I discussed with her the fact that loss of 5 - 10% of her  current body weight will have the most impact on her diabetes management.  Exercise, and detailed carbohydrates information provided  -  detailed on discharge instructions.  5) Vitamin D deficiency Her most recent  vitamin d level on 12/03/21 was 9.8.  I discussed and initiated replenishment with Ergocalciferol 50000 units po weekly.  6) Chronic Care/Health Maintenance: -she is on ACE and statin medications and is encouraged to initiate and continue to follow up with Ophthalmology, Dentist,  Podiatrist at least yearly or according to recommendations, and advised to stay away from smoking. I have recommended yearly flu vaccine and pneumonia vaccine at least every 5 years; moderate intensity exercise for up to 150 minutes weekly; and  sleep for at least 7 hours a day.  - she is advised to maintain close follow up with Hasanaj, Samul Dada, MD for primary care needs, as well as her other providers for optimal and coordinated care.     I spent 41 minutes in the care of the patient today including review of labs from [place], Lipids, Thyroid Function, Hematology (current and previous including abstractions from other facilities); face-to-face time discussing  her blood glucose readings/logs, discussing hypoglycemia and hyperglycemia episodes and symptoms, medications doses, her options of short and long term treatment based on the latest standards of care / guidelines;  discussion about incorporating lifestyle medicine;  and documenting the encounter.    Please refer to Patient Instructions for Blood Glucose Monitoring and Insulin/Medications Dosing Guide"  in media tab for additional information. Please  also refer to " Patient Self Inventory" in the Media  tab for reviewed elements of pertinent patient history.  Remus Blake participated in the discussions, expressed understanding, and voiced agreement with the above plans.  All questions were answered to her satisfaction. she is encouraged to contact clinic should she have any questions or concerns prior to her return visit.    Follow up plan: - Return in about 1 month (around 01/07/2022) for Diabetes F/U, Bring meter and logs, No previsit labs.    Rayetta Pigg,  [facility] [address] [place], [place] [address] Phone: [phone] Fax: [phone]   12/10/2021, 10:52 AM

## 2021-12-29 ENCOUNTER — Other Ambulatory Visit: Payer: Self-pay | Admitting: Nurse Practitioner

## 2021-12-29 DIAGNOSIS — E1165 Type 2 diabetes mellitus with hyperglycemia: Secondary | ICD-10-CM

## 2022-01-01 ENCOUNTER — Other Ambulatory Visit: Payer: Self-pay | Admitting: Nurse Practitioner

## 2022-01-03 ENCOUNTER — Other Ambulatory Visit: Payer: Self-pay

## 2022-01-03 ENCOUNTER — Ambulatory Visit
Admission: EM | Admit: 2022-01-03 | Discharge: 2022-01-03 | Disposition: A | Discharge status: Home / Self Care | Payer: Medicaid Other | Attending: Family Medicine | Admitting: Family Medicine

## 2022-01-03 DIAGNOSIS — Z9189 Other specified personal risk factors, not elsewhere classified: Secondary | ICD-10-CM

## 2022-01-03 DIAGNOSIS — B349 Viral infection, unspecified: Secondary | ICD-10-CM | POA: Diagnosis not present

## 2022-01-03 LAB — POCT RAPID STREP A (OFFICE): Rapid Strep A Screen: NEGATIVE

## 2022-01-03 NOTE — ED Provider Notes (Signed)
[facility]    CSN: [id number] Arrival date & time: 01/03/22  0901      History   Chief Complaint Chief Complaint  Patient presents with   Sore Throat   Cough   Nasal Congestion         HPI Jaime [name] is a 36 y.o. female.   36 year old female presents today with approximate 1 day of sore throat, cough, ingestion.  Symptoms been constant.  She has not take anything for symptoms.  No fevers or chills.   Sore Throat  Cough  Past Medical History:  Diagnosis Date   Anxiety    Asthma    Chronic abdominal pain    Chronic back pain    Cirrhosis ([place])    Cirrhosis ([place])    COPD (chronic obstructive pulmonary disease) ([place])    Depression    Diabetes mellitus without complication ([place])    Diabetes mellitus, type II ([place])    Hepatitis C    Hyperlipidemia    Insomnia    Long-term current use of methadone for opiate dependence ([place])    Lupus ([place])    Migraine headache    Neuropathy    Nocturnal seizures ([place])    Peptic ulcer    Spleen enlarged     Patient Active Problem List   Diagnosis Date Noted   Abdominal pain 11/17/2021   Gastroesophageal reflux disease 10/23/2021   Nausea and vomiting 10/23/2021   Diarrhea 10/23/2021   Rectal bleeding 07/28/2021   Esophageal dysphagia 07/28/2021   Melena 07/28/2021   Pain of upper abdomen 07/28/2021   Non-intractable vomiting 07/28/2021   Adjustment disorder with depressed mood 09/07/2020   Respiratory failure ([place]) 08/31/2020   Acute encephalopathy 08/31/2020   Hyponatremia 08/16/2020   Volume overload 08/14/2020   Dyspnea 07/29/2020   Nexplanon insertion 05/20/2020   Nexplanon removal 05/20/2020   Pressure injury of skin 01/29/2019   Uncontrolled diabetes mellitus 09/19/2018   Hyperglycemia 09/17/2018   Acute respiratory failure with hypoxia ([place]) 09/15/2018   Anxiety 09/15/2018   Chronic abdominal pain 09/15/2018   Diabetes mellitus without complication ([place]) 70/35/0093   Hepatitis C 09/15/2018    Peptic ulcer 09/15/2018   Long-term current use of methadone for opiate dependence ([place]) 09/15/2018   Cirrhosis ([place]) 09/15/2018   Acute hypoxemic respiratory failure ([place]) 07/17/2016   Pleural effusion on right 07/17/2016   Multiple rib fractures involving four or more ribs 07/17/2016   Hemothorax on right 07/17/2016   Chronic pain disorder 07/17/2016    Past Surgical History:  Procedure Laterality Date   BIOPSY  11/25/2021   Procedure: BIOPSY;  Surgeon: Harvel Quale, MD;  [facility];  Service: Gastroenterology;;   CHOLECYSTECTOMY     COLONOSCOPY WITH PROPOFOL N/A 11/25/2021   Procedure: COLONOSCOPY WITH PROPOFOL;  Surgeon: Harvel Quale, MD;  Location: [facility];  Service: Gastroenterology;  Laterality: N/A;  37   ESOPHAGOGASTRODUODENOSCOPY  06/2020   done at [facility], candida in upper esophagus (treated with diflucan), ulcerative esophagitis at GE junction, gastritis in stomach, single ulcer in duodenal bulb, with duodenal mucosa showing no abnormality. No presence of varices   ESOPHAGOGASTRODUODENOSCOPY (EGD) WITH PROPOFOL N/A 11/25/2021   Procedure: ESOPHAGOGASTRODUODENOSCOPY (EGD) WITH PROPOFOL;  Surgeon: Harvel Quale, MD;  Location[facility];  Service: Gastroenterology;  Laterality: N/A;    OB History     Gravida  0   Para  0   Term  0   Preterm  0   AB  0   Living  0      SAB  0   IAB  0   Ectopic  0   Multiple  0   Live Births  0            Home Medications    Prior to Admission medications   Medication Sig Start Date End Date Taking? Authorizing Provider  Accu-Chek Softclix Lancets lancets USE TO TEST BLOOD SUGAR 4 TIMES A DAY. 12/08/21   Brita Romp, NP  albuterol (ACCUNEB) 0.63 MG/3ML nebulizer solution Take 1 ampule by nebulization every 6 (six) hours as needed for wheezing. 07/27/20   [provider]  ARIPiprazole (ABILIFY) 20 MG tablet Take 20 mg by mouth daily.  10/15/21   [provider]  aspirin EC 81 MG tablet Take 81 mg by mouth daily. Swallow whole.    [provider]  blood glucose meter kit and supplies Dispense based on patient and insurance preference. Use up to four times daily as directed. (FOR ICD-10 E10.9, E11.9). 11/12/21   Brita Romp, NP  cetirizine (ZYRTEC ALLERGY) 10 MG tablet Take 1 tablet (10 mg total) by mouth daily. 07/09/20   Avegno, Darrelyn Hillock, FNP  clonazePAM (KLONOPIN) 0.5 MG tablet Take 1 tablet (0.5 mg total) by mouth 2 (two) times daily as needed for anxiety. Patient taking differently: Take 0.5 mg by mouth 2 (two) times daily. 09/09/20 11/21/21  Shelly Coss, MD  Continuous Blood Gluc Sensor (DEXCOM G6 SENSOR) MISC 4 Pieces by Does not apply route once a week. 12/01/21   Brita Romp, NP  Continuous Blood Gluc Transmit (DEXCOM G6 TRANSMITTER) MISC USE AS DIRECTED. 12/08/21   Brita Romp, NP  cromolyn (OPTICROM) 4 % ophthalmic solution Place 1 drop into both eyes 2 (two) times daily. 01/28/21   [provider]  etonogestrel (NEXPLANON) 68 MG IMPL implant 1 each by Subdermal route once.    [provider]  furosemide (LASIX) 40 MG tablet Take 40 mg by mouth daily. 05/22/20   [provider]  glipiZIDE (GLUCOTROL XL) 5 MG 24 hr tablet Take 1 tablet (5 mg total) by mouth daily with breakfast. 12/01/21   Brita Romp, NP  glucose blood (ACCU-CHEK GUIDE) test strip Use as instructed to check blood glucose four times daily 12/01/21   Brita Romp, NP  hyoscyamine (ANASPAZ) 0.125 MG TBDP disintergrating tablet TAKE 1 TABLET UNDER THE TONGUE THREE TIMES A DAY AS NEEDED FOR BLADDER SPASMS OR CRAMPING. 11/17/21   Harvel Quale, MD  Insulin Pen Needle (GLOBAL EASE INJECT PEN NEEDLES) 31G X 8 MM MISC Use 1 pen needle 4 times daily for insulin. 11/10/21   Brita Romp, NP  Insulin Pen Needle (PEN NEEDLES) 32G X 6 MM MISC 1 each by Does not apply route 3 (three)  times daily. 02/26/21   Brita Romp, NP  insulin regular human CONCENTRATED (HUMULIN R U-500 KWIKPEN) 500 UNIT/ML KwikPen Inject 70 Units into the skin 3 (three) times daily with meals. 12/10/21   Brita Romp, NP  liraglutide (VICTOZA) 18 MG/3ML SOPN Inject 1.8 mg into the skin daily. 12/01/21   Brita Romp, NP  lisinopril (ZESTRIL) 5 MG tablet TAKE 1 TABLET ONCE DAILY. 01/02/22   Brita Romp, NP  metFORMIN (GLUCOPHAGE) 1000 MG tablet Take 1 tablet (1,000 mg total) by mouth 2 (two) times daily with a meal. 12/01/21   Brita Romp, NP  methadone (DOLOPHINE) 10 MG/5ML solution Take  60 mg by mouth every morning.    [provider]  Multiple Vitamin (MULTIVITAMIN) tablet Take 1 tablet by mouth daily.    [provider]  nystatin (MYCOSTATIN/NYSTOP) powder Apply 1 application topically 3 (three) times daily. 05/19/21   Mesner, Corene Cornea, MD  omeprazole (PRILOSEC) 40 MG capsule Take 1 capsule (40 mg total) by mouth 2 (two) times daily. 11/25/21   Harvel Quale, MD  ondansetron (ZOFRAN) 4 MG tablet Take 1 tablet (4 mg total) by mouth every 8 (eight) hours as needed for nausea or vomiting. 11/17/21   Harvel Quale, MD  potassium chloride (KLOR-CON M) 10 MEQ tablet Take 10 mEq by mouth 2 (two) times daily. 10/15/21   [provider]  pregabalin (LYRICA) 75 MG capsule Take 75 mg by mouth in the morning, at noon, in the evening, and at bedtime.     [provider]  PROAIR HFA 108 [phone] Base) MCG/ACT inhaler Inhale 1-2 puffs into the lungs every 6 (six) hours as needed for wheezing or shortness of breath. 04/04/21   [provider]  propranolol (INDERAL) 10 MG tablet Take 10 mg by mouth 3 (three) times daily. 08/08/20   [provider]  RESTASIS 0.05 % ophthalmic emulsion Place 1 drop into both eyes as needed (dry eye).  04/22/19   [provider]  rosuvastatin (CRESTOR) 5 MG tablet Take 1 tablet (5 mg total) by  mouth daily. 08/25/21   Brita Romp, NP  spironolactone (ALDACTONE) 100 MG tablet Take 100 mg by mouth daily.  05/22/20   [provider]  sucralfate (CARAFATE) 1 GM/10ML suspension Take 10 mLs (1 g total) by mouth 4 (four) times daily -  with meals and at bedtime. 10/23/21   Carlan, Chelsea L, NP  triamcinolone (KENALOG) 0.1 % Apply 1 application topically daily as needed (irritation). 02/04/21   [provider]  venlafaxine XR (EFFEXOR-XR) 150 MG 24 hr capsule Take total of 225 mg daily (150 mg + 75 mg ) 01/24/21   Norman Clay, MD  venlafaxine XR (EFFEXOR-XR) 75 MG 24 hr capsule To take along with 194m for a total of 225 mg daily 01/20/21   HNorman Clay MD  Vitamin D, Ergocalciferol, (DRISDOL) 1.25 MG (50000 UNIT) CAPS capsule Take 1 capsule (50,000 Units total) by mouth every 7 (seven) days. 12/10/21   RBrita Romp NP    Family History Family History  Problem Relation Age of Onset   Hypertension Mother    Hyperlipidemia Mother    Hyperlipidemia Maternal Grandfather     Social History Social History   Tobacco Use   Smoking status: Every Day    Types: E-cigarettes   Smokeless tobacco: Never  Vaping Use   Vaping Use: Every day   Substances: Nicotine, Flavoring  Substance Use Topics   Alcohol use: No   Drug use: No     Allergies   Iodine-131, Ivp dye [iodinated contrast media], Ketorolac tromethamine, Tylenol [acetaminophen], Gabapentin, Morphine and related, Nsaids, Suboxone [buprenorphine hcl-naloxone hcl], and Vancomycin   Review of Systems Review of Systems  Respiratory:  Positive for cough.     Physical Exam Triage Vital Signs ED Triage Vitals  Enc Vitals Group     BP 01/03/22 0913 (!) 144/90     Pulse Rate 01/03/22 0913 96     Resp 01/03/22 0913 20     Temp 01/03/22 0913 97.9 F (36.6 C)     Temp Source 01/03/22 0913 Oral  SpO2 01/03/22 0913 97 %     Weight --      Height --      Head Circumference --      Peak Flow --       Pain Score 01/03/22 0915 8     Pain Loc --      Pain Edu? --      Excl. in [place]? --    No data found.  Updated Vital Signs BP (!) 144/90 (BP Location: Right Arm)    Pulse 96    Temp 97.9 F (36.6 C) (Oral)    Resp 20    SpO2 97%   Visual Acuity Right Eye Distance:   Left Eye Distance:   Bilateral Distance:    Right Eye Near:   Left Eye Near:    Bilateral Near:     Physical Exam Vitals and nursing note reviewed.  Constitutional:      General: She is not in acute distress.    Appearance: She is well-developed. She is not ill-appearing, toxic-appearing or diaphoretic.  HENT:     Head: Normocephalic and atraumatic.     Right Ear: Tympanic membrane and ear canal normal.     Left Ear: Tympanic membrane and ear canal normal.     Nose: Congestion present.  Cardiovascular:     Rate and Rhythm: Normal rate and regular rhythm.  Pulmonary:     Effort: Pulmonary effort is normal.     Breath sounds: Normal breath sounds.  Musculoskeletal:     Cervical back: Normal range of motion.  Skin:    General: Skin is warm and dry.  Neurological:     Mental Status: She is alert.     UC Treatments / Results  Labs (all labs ordered are listed, but only abnormal results are displayed) Labs Reviewed  COVID-19, FLU A+B NAA  POCT RAPID STREP A (OFFICE)    EKG   Radiology No results found.  Procedures Procedures (including critical care time)  Medications Ordered in UC Medications - No data to display  Initial Impression / Assessment and Plan / UC Course  I have reviewed the triage vital signs and the nursing notes.  Pertinent labs & imaging results that were available during my care of the patient were reviewed by me and considered in my medical decision making (see chart for details).     Viral illness Over-the-counter medicines as needed. Swab is pending Final Clinical Impressions(s) / UC Diagnoses   Final diagnoses:  Viral illness  At increased risk of exposure to  COVID-19 virus     Discharge Instructions      This is most likely some sort of viral illness.  You can take over-the-counter Mucinex, DayQuil as needed. Strep swab negative here today.  COVID swab pending    ED Prescriptions   None    PDMP not reviewed this encounter.   Orvan July, NP 01/03/22 [phone]

## 2022-01-03 NOTE — ED Triage Notes (Signed)
Pt reports sore throat, nasal congestion and cough x 1 day.  ?

## 2022-01-03 NOTE — Discharge Instructions (Signed)
This is most likely some sort of viral illness.  You can take over-the-counter Mucinex, DayQuil as needed. ?Strep swab negative here today.  COVID swab pending ?

## 2022-01-04 LAB — COVID-19, FLU A+B NAA
Influenza A, NAA: NOT DETECTED
Influenza B, NAA: NOT DETECTED
SARS-CoV-2, NAA: NOT DETECTED

## 2022-01-05 ENCOUNTER — Other Ambulatory Visit: Payer: Self-pay | Admitting: Nurse Practitioner

## 2022-01-07 ENCOUNTER — Ambulatory Visit (INDEPENDENT_AMBULATORY_CARE_PROVIDER_SITE_OTHER): Payer: Medicaid Other | Admitting: Nurse Practitioner

## 2022-01-07 ENCOUNTER — Other Ambulatory Visit: Payer: Self-pay

## 2022-01-07 ENCOUNTER — Encounter: Payer: Self-pay | Admitting: Nurse Practitioner

## 2022-01-07 VITALS — BP 125/79 | HR 92 | Ht 70.0 in | Wt 300.4 lb

## 2022-01-07 DIAGNOSIS — E782 Mixed hyperlipidemia: Secondary | ICD-10-CM

## 2022-01-07 DIAGNOSIS — Z91119 Patient's noncompliance with dietary regimen due to unspecified reason: Secondary | ICD-10-CM | POA: Diagnosis not present

## 2022-01-07 DIAGNOSIS — E1165 Type 2 diabetes mellitus with hyperglycemia: Secondary | ICD-10-CM | POA: Diagnosis not present

## 2022-01-07 MED ORDER — HUMULIN R U-500 KWIKPEN 500 UNIT/ML ~~LOC~~ SOPN: 70.0000 [IU] | PEN_INJECTOR | Freq: Three times a day (TID) | SUBCUTANEOUS | 2 refills | Status: DC

## 2022-01-07 NOTE — Progress Notes (Signed)
01/07/2022, 9:17 AM     Endocrinology Follow Up Visit  Subjective:    Patient ID: Jaime [name], female    DOB: 04-25-1986.  Jaime [name] is being seen in follow up for management of currently uncontrolled symptomatic diabetes requested by  Neale Burly, MD.   Past Medical History:  Diagnosis Date   Anxiety    Asthma    Chronic abdominal pain    Chronic back pain    Cirrhosis ([place])    Cirrhosis ([place])    COPD (chronic obstructive pulmonary disease) ([place])    Depression    Diabetes mellitus without complication ([place])    Diabetes mellitus, type II ([place])    Hepatitis C    Hyperlipidemia    Insomnia    Long-term current use of methadone for opiate dependence ([place])    Lupus ([place])    Migraine headache    Neuropathy    Nocturnal seizures ([place])    Peptic ulcer    Spleen enlarged     Past Surgical History:  Procedure Laterality Date   BIOPSY  11/25/2021   Procedure: BIOPSY;  Surgeon: Harvel Quale, MD;  [facility];  Service: Gastroenterology;;   CHOLECYSTECTOMY     COLONOSCOPY WITH PROPOFOL N/A 11/25/2021   Procedure: COLONOSCOPY WITH PROPOFOL;  Surgeon: Harvel Quale, MD;  Location: [facility];  Service: Gastroenterology;  Laterality: N/A;  46   ESOPHAGOGASTRODUODENOSCOPY  06/2020   done at [facility], candida in upper esophagus (treated with diflucan), ulcerative esophagitis at GE junction, gastritis in stomach, single ulcer in duodenal bulb, with duodenal mucosa showing no abnormality. No presence of varices   ESOPHAGOGASTRODUODENOSCOPY (EGD) WITH PROPOFOL N/A 11/25/2021   Procedure: ESOPHAGOGASTRODUODENOSCOPY (EGD) WITH PROPOFOL;  Surgeon: Harvel Quale, MD;  [facility];  Service: Gastroenterology;  Laterality: N/A;    Social History   Socioeconomic History   Marital status: Single    Spouse name: Not on file   Number of  children: Not on file   Years of education: Not on file   Highest education level: Not on file  Occupational History   Not on file  Tobacco Use   Smoking status: Every Day    Types: E-cigarettes   Smokeless tobacco: Never  Vaping Use   Vaping Use: Every day   Substances: Nicotine, Flavoring  Substance and Sexual Activity   Alcohol use: No   Drug use: No   Sexual activity: Not Currently    Birth control/protection: Implant  Other Topics Concern   Not on file  Social History Narrative   Not on file   Social Determinants of Health   Financial Resource Strain: Not on file  Food Insecurity: Not on file  Transportation Needs: Not on file  Physical Activity: Not on file  Stress: Not on file  Social Connections: Not on file    Family History  Problem Relation Age of Onset   Hypertension Mother    Hyperlipidemia Mother    Hyperlipidemia Maternal Grandfather     Outpatient Encounter Medications as of 01/07/2022  Medication Sig   Accu-Chek Softclix Lancets lancets USE TO  TEST BLOOD SUGAR 4 TIMES A DAY.   albuterol (ACCUNEB) 0.63 MG/3ML nebulizer solution Take 1 ampule by nebulization every 6 (six) hours as needed for wheezing.   ARIPiprazole (ABILIFY) 20 MG tablet Take 20 mg by mouth daily.   aspirin EC 81 MG tablet Take 81 mg by mouth daily. Swallow whole.   blood glucose meter kit and supplies Dispense based on patient and insurance preference. Use up to four times daily as directed. (FOR ICD-10 E10.9, E11.9).   cefUROXime (CEFTIN) 500 MG tablet Take 500 mg by mouth 2 (two) times daily.   cetirizine (ZYRTEC ALLERGY) 10 MG tablet Take 1 tablet (10 mg total) by mouth daily.   Continuous Blood Gluc Sensor (DEXCOM G6 SENSOR) MISC 4 Pieces by Does not apply route once a week.   Continuous Blood Gluc Transmit (DEXCOM G6 TRANSMITTER) MISC USE AS DIRECTED.   cromolyn (OPTICROM) 4 % ophthalmic solution Place 1 drop into both eyes 2 (two) times daily.   etonogestrel (NEXPLANON) 68 MG  IMPL implant 1 each by Subdermal route once.   furosemide (LASIX) 40 MG tablet Take 40 mg by mouth daily.   glipiZIDE (GLUCOTROL XL) 5 MG 24 hr tablet Take 1 tablet (5 mg total) by mouth daily with breakfast.   glucose blood (ACCU-CHEK GUIDE) test strip Use as instructed to check blood glucose four times daily   hyoscyamine (ANASPAZ) 0.125 MG TBDP disintergrating tablet TAKE 1 TABLET UNDER THE TONGUE THREE TIMES A DAY AS NEEDED FOR BLADDER SPASMS OR CRAMPING.   Insulin Pen Needle (GLOBAL EASE INJECT PEN NEEDLES) 31G X 8 MM MISC Use 1 pen needle 4 times daily for insulin.   Insulin Pen Needle (PEN NEEDLES) 32G X 6 MM MISC 1 each by Does not apply route 3 (three) times daily.   liraglutide (VICTOZA) 18 MG/3ML SOPN Inject 1.8 mg into the skin daily.   lisinopril (ZESTRIL) 5 MG tablet TAKE 1 TABLET ONCE DAILY.   metFORMIN (GLUCOPHAGE) 1000 MG tablet Take 1 tablet (1,000 mg total) by mouth 2 (two) times daily with a meal.   methadone (DOLOPHINE) 10 MG/5ML solution Take 60 mg by mouth every morning.   Multiple Vitamin (MULTIVITAMIN) tablet Take 1 tablet by mouth daily.   nystatin (MYCOSTATIN/NYSTOP) powder Apply 1 application topically 3 (three) times daily.   omeprazole (PRILOSEC) 40 MG capsule Take 1 capsule (40 mg total) by mouth 2 (two) times daily.   ondansetron (ZOFRAN) 4 MG tablet Take 1 tablet (4 mg total) by mouth every 8 (eight) hours as needed for nausea or vomiting.   potassium chloride (KLOR-CON M) 10 MEQ tablet Take 10 mEq by mouth 2 (two) times daily.   potassium chloride (KLOR-CON) 10 MEQ tablet Take 10 mEq by mouth 2 (two) times daily.   pregabalin (LYRICA) 75 MG capsule Take 75 mg by mouth in the morning, at noon, in the evening, and at bedtime.    PROAIR HFA 108 (90 Base) MCG/ACT inhaler Inhale 1-2 puffs into the lungs every 6 (six) hours as needed for wheezing or shortness of breath.   propranolol (INDERAL) 10 MG tablet Take 10 mg by mouth 3 (three) times daily.   QUEtiapine  (SEROQUEL) 25 MG tablet Take 25 mg by mouth at bedtime.   RESTASIS 0.05 % ophthalmic emulsion Place 1 drop into both eyes as needed (dry eye).    rosuvastatin (CRESTOR) 5 MG tablet Take 1 tablet (5 mg total) by mouth daily.   spironolactone (ALDACTONE) 100 MG tablet Take 100 mg by  mouth daily.    sucralfate (CARAFATE) 1 GM/10ML suspension Take 10 mLs (1 g total) by mouth 4 (four) times daily -  with meals and at bedtime.   triamcinolone (KENALOG) 0.1 % Apply 1 application topically daily as needed (irritation).   venlafaxine XR (EFFEXOR-XR) 150 MG 24 hr capsule Take total of 225 mg daily (150 mg + 75 mg )   venlafaxine XR (EFFEXOR-XR) 75 MG 24 hr capsule To take along with 140m for a total of 225 mg daily   Vitamin D, Ergocalciferol, (DRISDOL) 1.25 MG (50000 UNIT) CAPS capsule Take 1 capsule (50,000 Units total) by mouth every 7 (seven) days.   [DISCONTINUED] insulin regular human CONCENTRATED (HUMULIN R U-500 KWIKPEN) 500 UNIT/ML KwikPen Inject 70 Units into the skin 3 (three) times daily with meals.   [DISCONTINUED] NOVOLOG FLEXPEN 100 UNIT/ML FlexPen Inject into the skin.   [DISCONTINUED] TOUJEO MAX SOLOSTAR 300 UNIT/ML Solostar Pen Inject into the skin.   clonazePAM (KLONOPIN) 0.5 MG tablet Take 1 tablet (0.5 mg total) by mouth 2 (two) times daily as needed for anxiety. (Patient taking differently: Take 0.5 mg by mouth 2 (two) times daily.)   insulin regular human CONCENTRATED (HUMULIN R U-500 KWIKPEN) 500 UNIT/ML KwikPen Inject 70 Units into the skin 3 (three) times daily with meals.   No facility-administered encounter medications on file as of 01/07/2022.    ALLERGIES: Allergies  Allergen Reactions   Iodine-131 Anaphylaxis, Shortness Of Breath and Swelling   Ivp Dye [Iodinated Contrast Media] Anaphylaxis    Skin gets very red, unable to walk    Ketorolac Tromethamine Shortness Of Breath   Tylenol [Acetaminophen] Other (See Comments)    Due to cirrhosis    Gabapentin    Morphine  And Related     Pt says she is not allergic to Morphine 08/12/20   Nsaids Other (See Comments)    Flares ulcers   Suboxone [Buprenorphine Hcl-Naloxone Hcl]    Vancomycin Swelling    Facial swelling,     VACCINATION STATUS: Immunization History  Administered Date(s) Administered   Influenza,inj,Quad PF,6+ Mos 07/18/2016   Moderna Sars-Covid-2 Vaccination 03/07/2020, 04/04/2020   Pneumococcal Polysaccharide-23 07/18/2016, 08/01/2020    Diabetes She presents for her follow-up diabetic visit. She has type 2 diabetes mellitus. Onset time: She was diagnosed at approximate age of 89years. Her disease course has been worsening. There are no hypoglycemic associated symptoms. Pertinent negatives for hypoglycemia include no headaches, nervousness/anxiousness or pallor. (Had symptoms of hypoglycemia when glucose dropped to 117.) Associated symptoms include blurred vision, fatigue, foot paresthesias, polydipsia and polyuria. Pertinent negatives for diabetes include no chest pain, no polyphagia and no weight loss. There are no hypoglycemic complications. (History of unresponsiveness requiring EMS and hospitalization- none recent) Symptoms are stable. Diabetic complications include heart disease and peripheral neuropathy. Risk factors for coronary artery disease include diabetes mellitus, obesity, sedentary lifestyle, dyslipidemia and hypertension. Current diabetic treatment includes oral agent (dual therapy) and intensive insulin program (and Victoza). She is compliant with treatment most of the time. Her weight is fluctuating dramatically. She is following a low salt and generally unhealthy diet. When asked about meal planning, she reported none. She has had a previous visit with a dietitian. She participates in exercise intermittently (5 days per week). Her home blood glucose trend is fluctuating minimally. Her breakfast blood glucose range is generally >200 mg/dl. Her lunch blood glucose range is generally  >200 mg/dl. Her dinner blood glucose range is generally >200 mg/dl. Her bedtime blood glucose  range is generally >200 mg/dl. Her overall blood glucose range is >200 mg/dl. (She presents today, accompanied by her mother, with her CGM, no logs, showing slightly worsening glycemic profile.  She was not due for another A1c today.  She had not yet switched over to the U500 as discussed at last visit, was trying to use up her current supply of insulin at home first.  She does report having a viral illness recently which has caused her readings to be higher than usual.  Analysis of her CGM shows TIR < 1%, TAR 99%, TBR 0% with a GMI of 12%.  She continues to be more active, going to the gym with her mother.) An ACE inhibitor/angiotensin II receptor blocker is being taken. She does not see a podiatrist.Eye exam is not current.  Hypertension This is a chronic problem. The current episode started more than 1 month ago. The problem is unchanged. The problem is controlled. Associated symptoms include blurred vision and peripheral edema. Pertinent negatives include no chest pain, headaches, palpitations or shortness of breath. There are no associated agents to hypertension. Risk factors for coronary artery disease include diabetes mellitus, dyslipidemia, obesity and sedentary lifestyle. Past treatments include diuretics and beta blockers. The current treatment provides no improvement. There are no compliance problems.     Review of systems  Constitutional: + Minimally fluctuating body weight,  current Body mass index is 43.1 kg/m. , no fatigue, no subjective hyperthermia, no subjective hypothermia Eyes: no blurry vision, no xerophthalmia ENT: no sore throat, no nodules palpated in throat, no dysphagia/odynophagia, no hoarseness Cardiovascular: no chest pain, no shortness of breath, no palpitations, no leg swelling Respiratory: no cough, no shortness of breath Gastrointestinal: no  nausea/vomiting/diarrhea Musculoskeletal: no muscle/joint aches Skin: no rashes, no hyperemia Neurological: no tremors, no numbness, no tingling, no dizziness Psychiatric: no depression, no anxiety  Objective:     BP 125/79    Pulse 92    Ht 5' 10"  (1.778 m)    Wt (!) 300 lb 6.4 oz (136.3 kg)    SpO2 96%    BMI 43.10 kg/m   Wt Readings from Last 3 Encounters:  01/07/22 (!) 300 lb 6.4 oz (136.3 kg)  12/10/21 293 lb (132.9 kg)  11/24/21 283 lb 4.8 oz (128.5 kg)    BP Readings from Last 3 Encounters:  01/07/22 125/79  01/03/22 (!) 144/90  12/10/21 128/77     Physical Exam- Limited  Constitutional:  Body mass index is 43.1 kg/m. , not in acute distress, normal state of mind Eyes:  EOMI, no exophthalmos Neck: Supple Cardiovascular: RRR, no murmurs, rubs, or gallops, no edema Respiratory: Adequate breathing efforts, no crackles, rales, rhonchi, or wheezing Musculoskeletal: no gross deformities, strength intact in all four extremities, no gross restriction of joint movements Skin:  no rashes, no hyperemia Neurological: no tremor with outstretched hands   Diabetic Foot Exam - Simple   No data filed    CMP ( most recent) CMP     Component Value Date/Time   NA 140 12/03/2021 0832   K 3.9 12/03/2021 0832   CL 104 12/03/2021 0832   CO2 23 12/03/2021 0832   GLUCOSE 274 (H) 12/03/2021 0832   GLUCOSE 72 11/24/2021 1109   BUN 6 12/03/2021 0832   CREATININE 0.64 12/03/2021 0832   CALCIUM 8.6 (L) 12/03/2021 0832   PROT 5.8 (L) 12/03/2021 0832   ALBUMIN 3.0 (L) 12/03/2021 0832   AST 42 (H) 12/03/2021 0832   ALT 32 12/03/2021 0832  ALKPHOS 151 (H) 12/03/2021 0832   BILITOT 0.6 12/03/2021 0832   GFRNONAA >60 11/24/2021 1109   GFRAA >60 08/01/2020 0704     Diabetic Labs (most recent): Lab Results  Component Value Date   HGBA1C 10.9 (A) 11/05/2021   HGBA1C 11.4 (H) 07/29/2021   HGBA1C 10.3 (A) 05/28/2021     Lipid Panel ( most recent) Lipid Panel     Component  Value Date/Time   CHOL 182 12/03/2021 0832   TRIG 190 (H) 12/03/2021 0832   HDL 46 12/03/2021 0832   CHOLHDL 4.0 12/03/2021 0832   LDLCALC 103 (H) 12/03/2021 0832   LABVLDL 33 12/03/2021 0832      Lab Results  Component Value Date   TSH 2.700 12/03/2021   TSH 0.635 09/08/2020   TSH 0.993 07/30/2020   TSH 1.990 07/23/2020   TSH 1.690 08/22/2010   FREET4 0.70 (L) 12/03/2021   FREET4 0.72 (L) 07/23/2020   FREET4 1.11 08/22/2010      Assessment & Plan:   1) Type 2 diabetes mellitus with hyperglycemia, without long-term current use of insulin (HCC)  - Keamber Macfadden has currently uncontrolled symptomatic type 2 DM since  36 years of age.  She presents today, accompanied by her mother, with her CGM, no logs, showing slightly worsening glycemic profile.  She was not due for another A1c today.  She had not yet switched over to the U500 as discussed at last visit, was trying to use up her current supply of insulin at home first.  She does report having a viral illness recently which has caused her readings to be higher than usual.  Analysis of her CGM shows TIR < 1%, TAR 99%, TBR 0% with a GMI of 12%.  She continues to be more active, going to the gym with her mother.  - I had a long discussion with her about the progressive nature of diabetes and the pathology behind its complications.  -her diabetes is complicated by obesity/sedentary life and she remains at a high risk for more acute and chronic complications which include CAD, CVA, CKD, retinopathy, and neuropathy. These are all discussed in detail with her.  - Nutritional counseling repeated at each appointment due to patients tendency to fall back in to old habits.  - The patient admits there is a room for improvement in their diet and drink choices. -  Suggestion is made for the patient to avoid simple carbohydrates from their diet including Cakes, Sweet Desserts / Pastries, Ice Cream, Soda (diet and regular), Sweet Tea, Candies,  Chips, Cookies, Sweet Pastries, Store Bought Juices, Alcohol in Excess of 1-2 drinks a day, Artificial Sweeteners, Coffee Creamer, and "Sugar-free" Products. This will help patient to have stable blood glucose profile and potentially avoid unintended weight gain.   - I encouraged the patient to switch to unprocessed or minimally processed complex starch and increased protein intake (animal or plant source), fruits, and vegetables.   - Patient is advised to stick to a routine mealtimes to eat 3 meals a day and avoid unnecessary snacks (to snack only to correct hypoglycemia).  - I have approached her with the following individualized plan to manage  her diabetes and patient agrees:   -She is advised to start her Humulin R- U500 at 70 units SQ TID with meals to maximize absorption and reduce lipodystrophy.  She can continue Victoza 1.8 mg SQ daily (may switch to Ozempic or Trulicity at next visit for added benefit of weight reduction), Metformin 1000 mg  po twice daily and Glipizide 5 mg XL daily with breakfast.    -She is encouraged to continue to monitor glucose 4 times daily (using her CGM), before meals and before bed and log on the clinic sheets provided.  They are instructed to call the clinic if she has readings less than 70 or greater than 300 for 3 tests in a row.     - Specific targets for  A1c;  LDL, HDL,  and Triglycerides were discussed with the patient.  2) Blood Pressure /Hypertension: Her blood pressure is controlled to target.  She is advised to continue Lasix 80 mg po daily, continue Lisinopril 5 mg po daily, continue Propanolol 10 mg po TID, and Aldactone 100 mg o daily  3) Lipids/Hyperlipidemia:   Review of her recent lipid profile from 12/03/21 shows uncontrolled LDL at 103-improved and elevated triglycerides of 190-improved.  She is advised to continue Crestor 5 mg po daily at bedtime.  Side effects and precautions discussed with her.    4)  Weight/Diet:  Her Body mass index is  43.1 kg/m.  -   clearly complicating her diabetes care.   she is  a candidate for modest weight loss. I discussed with her the fact that loss of 5 - 10% of her  current body weight will have the most impact on her diabetes management.  Exercise, and detailed carbohydrates information provided  -  detailed on discharge instructions.  5) Vitamin D deficiency Her most recent vitamin d level on 12/03/21 was 9.8.  I discussed and initiated replenishment with Ergocalciferol 50000 units po weekly- she is still taking this.  6) Chronic Care/Health Maintenance: -she is on ACE and statin medications and is encouraged to initiate and continue to follow up with Ophthalmology, Dentist,  Podiatrist at least yearly or according to recommendations, and advised to stay away from smoking. I have recommended yearly flu vaccine and pneumonia vaccine at least every 5 years; moderate intensity exercise for up to 150 minutes weekly; and  sleep for at least 7 hours a day.  - she is advised to maintain close follow up with Hasanaj, Samul Dada, MD for primary care needs, as well as her other providers for optimal and coordinated care.      I spent 30 minutes in the care of the patient today including review of labs from [facility], Lipids, Thyroid Function, Hematology (current and previous including abstractions from other facilities); face-to-face time discussing  her blood glucose readings/logs, discussing hypoglycemia and hyperglycemia episodes and symptoms, medications doses, her options of short and long term treatment based on the latest standards of care / guidelines;  discussion about incorporating lifestyle medicine;  and documenting the encounter.    Please refer to Patient Instructions for Blood Glucose Monitoring and Insulin/Medications Dosing Guide"  in media tab for additional information. Please  also refer to " Patient Self Inventory" in the Media  tab for reviewed elements of pertinent patient history.  Remus Blake  participated in the discussions, expressed understanding, and voiced agreement with the above plans.  All questions were answered to her satisfaction. she is encouraged to contact clinic should she have any questions or concerns prior to her return visit.    Follow up plan: - Return in about 1 month (around 02/07/2022) for Diabetes F/U, Bring meter and logs.    Rayetta Pigg, [facility] [address] [place], [place] [address] Phone: [phone] Fax: [phone]   01/07/2022, 9:17 AM

## 2022-01-07 NOTE — Patient Instructions (Signed)
Diabetes Mellitus Emergency Preparedness Plan ?A diabetes emergency preparedness plan is a checklist to make sure you have everything you need to manage your diabetes in case of an emergency, such as an evacuation, natural disaster, national security emergency, or pandemic lockdown. ?Managing your diabetes is something you have to do all day every day. The American Diabetes Association and the American College of Endocrinology both recommend putting together an emergency diabetes kit. Your kit should include important information and documents as well as all the supplies you will need to manage your diabetes for at least 1 week. Store it in a portable, waterproof bag or container. The best time to start making your emergency kit is now. ?How to make your emergency kit ?Collect information and documents ?Include the following information and documents in your kit: ?The type of diabetes you have. ?A copy of your health insurance cards and photo ID. ?A list of all your other medical conditions, allergies, and surgeries. ?A list of all your medicines and doses with the contact information for your pharmacy. Ask your health care provider for a list of your current medicines. ?Any recent lab results, including your latest hemoglobin A1C (HbA1C). ?The make, model, and serial number of your insulin pump, if you use one. Also include contact information for the manufacturer. ?Contact information for people who should be notified in case of an emergency. Include your health care provider's name, address, and phone number. ?Collect diabetes care items ?Include the following diabetes care items in your kit: ?At least a 1-week supply of: ?Oral medicines. ?Insulin. ?Blood glucose testing supplies. These include testing strips, lancets, and extra batteries for your blood glucose monitor and pump. ?A charger for the continuous glucose monitor (CGM) receiver and pump. ?Any extra supplies needed for your CGM or pump. ?A supply of  glucagon, glucose tablets, juice, soda, or hard candy in case of hypoglycemia. ?Coolers or cold packs. ?A safe container for syringes, needles, and lancets. ? ?Other preparations ?Other things to consider doing as part of your emergency plan: ?Make sure that your mobile phone is charged and that you have an extra charger, cable, or batteries. ?Choose a meeting place for family members. ?Wear a medical alert or ID bracelet. ?If you have a child with diabetes, make sure your child's school has a copy of his or her emergency plan, including the name of the staff member who will assist your child. ?Where to find more information ?American Diabetes Association: www.diabetes.org ?Centers for Disease Control and Prevention: blogs.cdc.gov ?Summary ?A diabetes emergency preparedness plan is a checklist to make sure you have everything you need in case of an emergency. ?Your kit should include important information and documents as well as all the supplies you will need to manage your condition for at least 1 week. ?Store your kit in a portable, waterproof bag or container. ?The best time to start making your emergency kit is now. ?This information is not intended to replace advice given to you by your health care provider. Make sure you discuss any questions you have with your health care provider. ?Document Revised: 04/25/2020 Document Reviewed: 04/25/2020 ?Elsevier Patient Education ? 2022 Elsevier Inc. ? ?

## 2022-01-12 ENCOUNTER — Telehealth: Payer: Self-pay | Admitting: Nurse Practitioner

## 2022-01-12 MED ORDER — SEMAGLUTIDE (1 MG/DOSE) 4 MG/3ML ~~LOC~~ SOPN: 1.0000 mg | PEN_INJECTOR | SUBCUTANEOUS | 3 refills | Status: DC

## 2022-01-12 NOTE — Telephone Encounter (Signed)
Pt victoza runs out this week and the medication you were wanting to switch her too can be sent in her pt mom. ?

## 2022-01-12 NOTE — Telephone Encounter (Signed)
I sent in for Ozempic 1 mg SQ weekly.  This will take place of her daily Victoza.

## 2022-01-12 NOTE — Telephone Encounter (Signed)
Informed pt mother ?

## 2022-01-20 ENCOUNTER — Telehealth: Payer: Self-pay | Admitting: Nurse Practitioner

## 2022-01-20 NOTE — Telephone Encounter (Signed)
Nyna's mom stopped by and wanted [place] to look at her A1C that her PCP did. Printed and gave to [employer] said to increase her to 80 units of her Novolog. Made Ivin Booty aware ?

## 2022-01-21 MED ORDER — HUMULIN R U-500 KWIKPEN 500 UNIT/ML ~~LOC~~ SOPN: 80.0000 [IU] | PEN_INJECTOR | Freq: Three times a day (TID) | SUBCUTANEOUS | 2 refills | Status: DC

## 2022-01-21 NOTE — Telephone Encounter (Signed)
done

## 2022-01-21 NOTE — Telephone Encounter (Signed)
Pt mother called and said that she is needing a new rx for the Humulin since it was increased to 80 units.  ? ?insulin regular human CONCENTRATED (HUMULIN R U-500 KWIKPEN) 500 UNIT/ML KwikPen  ?

## 2022-01-21 NOTE — Addendum Note (Signed)
Addended by: Brita Romp on: 01/21/2022 10:15 AM ? ? Modules accepted: Orders ? ?

## 2022-01-21 NOTE — Telephone Encounter (Signed)
Notified Ivin Booty (pt mother) ?

## 2022-01-26 ENCOUNTER — Encounter (INDEPENDENT_AMBULATORY_CARE_PROVIDER_SITE_OTHER): Payer: Self-pay | Admitting: *Deleted

## 2022-01-26 ENCOUNTER — Other Ambulatory Visit (INDEPENDENT_AMBULATORY_CARE_PROVIDER_SITE_OTHER): Payer: Self-pay | Admitting: Gastroenterology

## 2022-01-26 DIAGNOSIS — R1084 Generalized abdominal pain: Secondary | ICD-10-CM

## 2022-01-27 NOTE — Telephone Encounter (Signed)
Last seen 11/17/21 ?

## 2022-02-10 ENCOUNTER — Ambulatory Visit (INDEPENDENT_AMBULATORY_CARE_PROVIDER_SITE_OTHER): Payer: Medicaid Other | Admitting: Nurse Practitioner

## 2022-02-10 ENCOUNTER — Encounter: Payer: Self-pay | Admitting: Nurse Practitioner

## 2022-02-10 VITALS — BP 100/65 | HR 93 | Ht 70.0 in | Wt 286.4 lb

## 2022-02-10 DIAGNOSIS — E1165 Type 2 diabetes mellitus with hyperglycemia: Secondary | ICD-10-CM

## 2022-02-10 DIAGNOSIS — E782 Mixed hyperlipidemia: Secondary | ICD-10-CM | POA: Diagnosis not present

## 2022-02-10 LAB — POCT GLYCOSYLATED HEMOGLOBIN (HGB A1C): HbA1c, POC (controlled diabetic range): 8.4 % — AB (ref 0.0–7.0)

## 2022-02-10 MED ORDER — HUMULIN R U-500 KWIKPEN 500 UNIT/ML ~~LOC~~ SOPN: 70.0000 [IU] | PEN_INJECTOR | Freq: Three times a day (TID) | SUBCUTANEOUS | 2 refills | Status: DC

## 2022-02-10 NOTE — Progress Notes (Signed)
? ?                                                          ?     02/10/2022, 11:21 AM ?    Endocrinology Follow Up Visit ? ?Subjective:  ? ? Patient ID: Jaime [name], female    DOB: March 01, 1986.  ?Jaime [name] is being seen in follow up for management of currently uncontrolled symptomatic diabetes requested by  Neale Burly, MD. ? ? ?Past Medical History:  ?Diagnosis Date  ? Anxiety   ? Asthma   ? Chronic abdominal pain   ? Chronic back pain   ? Cirrhosis ([place])   ? Cirrhosis ([place])   ? COPD (chronic obstructive pulmonary disease) ([place])   ? Depression   ? Diabetes mellitus without complication ([place])   ? Diabetes mellitus, type II ([place])   ? Hepatitis C   ? Hyperlipidemia   ? Insomnia   ? Long-term current use of methadone for opiate dependence ([place])   ? Lupus ([place])   ? Migraine headache   ? Neuropathy   ? Nocturnal seizures ([place])   ? Peptic ulcer   ? Spleen enlarged   ? ? ?Past Surgical History:  ?Procedure Laterality Date  ? BIOPSY  11/25/2021  ? Procedure: BIOPSY;  Surgeon: Harvel Quale, MD;  [facility];  Service: Gastroenterology;;  ? CHOLECYSTECTOMY    ? COLONOSCOPY WITH PROPOFOL N/A 11/25/2021  ? Procedure: COLONOSCOPY WITH PROPOFOL;  Surgeon: Harvel Quale, MD;  [facility];  Service: Gastroenterology;  Laterality: N/A;  805  ? ESOPHAGOGASTRODUODENOSCOPY  06/2020  ? done at [facility], candida in upper esophagus (treated with diflucan), ulcerative esophagitis at GE junction, gastritis in stomach, single ulcer in duodenal bulb, with duodenal mucosa showing no abnormality. No presence of varices  ? ESOPHAGOGASTRODUODENOSCOPY (EGD) WITH PROPOFOL N/A 11/25/2021  ? Procedure: ESOPHAGOGASTRODUODENOSCOPY (EGD) WITH PROPOFOL;  Surgeon: Harvel Quale, MD;  [facility];  Service: Gastroenterology;  Laterality: N/A;  ? ? ?Social History  ? ?Socioeconomic History  ? Marital status: Single  ?  Spouse name: Not on file  ? Number of  children: Not on file  ? Years of education: Not on file  ? Highest education level: Not on file  ?Occupational History  ? Not on file  ?Tobacco Use  ? Smoking status: Every Day  ?  Types: E-cigarettes  ? Smokeless tobacco: Never  ?Vaping Use  ? Vaping Use: Every day  ? Substances: Nicotine, Flavoring  ?Substance and Sexual Activity  ? Alcohol use: No  ? Drug use: No  ? Sexual activity: Not Currently  ?  Birth control/protection: Implant  ?Other Topics Concern  ? Not on file  ?Social History Narrative  ? Not on file  ? ?Social Determinants of Health  ? ?Financial Resource Strain: Not on file  ?Food Insecurity: Not on file  ?Transportation Needs: Not on file  ?Physical Activity: Not on file  ?Stress: Not on file  ?Social Connections: Not on file  ? ? ?Family History  ?Problem Relation Age of Onset  ? Hypertension Mother   ? Hyperlipidemia Mother   ? Hyperlipidemia Maternal Grandfather   ? ? ?Outpatient Encounter Medications as of 02/10/2022  ?Medication Sig  ? Accu-Chek Softclix Lancets lancets USE TO  TEST BLOOD SUGAR 4 TIMES A DAY.  ? albuterol (ACCUNEB) 0.63 MG/3ML nebulizer solution Take 1 ampule by nebulization every 6 (six) hours as needed for wheezing.  ? ARIPiprazole (ABILIFY) 20 MG tablet Take 20 mg by mouth daily.  ? aspirin EC 81 MG tablet Take 81 mg by mouth daily. Swallow whole.  ? blood glucose meter kit and supplies Dispense based on patient and insurance preference. Use up to four times daily as directed. (FOR ICD-10 E10.9, E11.9).  ? cefUROXime (CEFTIN) 500 MG tablet Take 500 mg by mouth 2 (two) times daily.  ? cetirizine (ZYRTEC ALLERGY) 10 MG tablet Take 1 tablet (10 mg total) by mouth daily.  ? Continuous Blood Gluc Sensor (DEXCOM G6 SENSOR) MISC 4 Pieces by Does not apply route once a week.  ? Continuous Blood Gluc Transmit (DEXCOM G6 TRANSMITTER) MISC USE AS DIRECTED.  ? cromolyn (OPTICROM) 4 % ophthalmic solution Place 1 drop into both eyes 2 (two) times daily.  ? etonogestrel (NEXPLANON) 68 MG  IMPL implant 1 each by Subdermal route once.  ? furosemide (LASIX) 40 MG tablet Take 40 mg by mouth daily.  ? glipiZIDE (GLUCOTROL XL) 5 MG 24 hr tablet Take 1 tablet (5 mg total) by mouth daily with breakfast.  ? glucose blood (ACCU-CHEK GUIDE) test strip Use as instructed to check blood glucose four times daily  ? hyoscyamine (ANASPAZ) 0.125 MG TBDP disintergrating tablet TAKE 1 TABLET UNDER THE TONGUE THREE TIMES A DAY AS NEEDED FOR BLADDER SPASMS OR CRAMPING.  ? Insulin Pen Needle (GLOBAL EASE INJECT PEN NEEDLES) 31G X 8 MM MISC Use 1 pen needle 4 times daily for insulin.  ? Insulin Pen Needle (PEN NEEDLES) 32G X 6 MM MISC 1 each by Does not apply route 3 (three) times daily.  ? lisinopril (ZESTRIL) 5 MG tablet TAKE 1 TABLET ONCE DAILY.  ? metFORMIN (GLUCOPHAGE) 1000 MG tablet Take 1 tablet (1,000 mg total) by mouth 2 (two) times daily with a meal.  ? methadone (DOLOPHINE) 10 MG/5ML solution Take 60 mg by mouth every morning.  ? Multiple Vitamin (MULTIVITAMIN) tablet Take 1 tablet by mouth daily.  ? nystatin (MYCOSTATIN/NYSTOP) powder Apply 1 application topically 3 (three) times daily.  ? omeprazole (PRILOSEC) 40 MG capsule Take 1 capsule (40 mg total) by mouth 2 (two) times daily.  ? ondansetron (ZOFRAN) 4 MG tablet Take 1 tablet (4 mg total) by mouth every 8 (eight) hours as needed for nausea or vomiting.  ? potassium chloride (KLOR-CON M) 10 MEQ tablet Take 10 mEq by mouth 2 (two) times daily.  ? potassium chloride (KLOR-CON) 10 MEQ tablet Take 10 mEq by mouth 2 (two) times daily.  ? pregabalin (LYRICA) 75 MG capsule Take 75 mg by mouth in the morning, at noon, in the evening, and at bedtime.   ? PROAIR HFA 108 (90 Base) MCG/ACT inhaler Inhale 1-2 puffs into the lungs every 6 (six) hours as needed for wheezing or shortness of breath.  ? propranolol (INDERAL) 10 MG tablet Take 10 mg by mouth 3 (three) times daily.  ? QUEtiapine (SEROQUEL) 25 MG tablet Take 25 mg by mouth at bedtime.  ? RESTASIS 0.05 %  ophthalmic emulsion Place 1 drop into both eyes as needed (dry eye).   ? rosuvastatin (CRESTOR) 5 MG tablet Take 1 tablet (5 mg total) by mouth daily.  ? Semaglutide, 1 MG/DOSE, 4 MG/3ML SOPN Inject 1 mg as directed once a week.  ? spironolactone (ALDACTONE) 100 MG tablet Take 100  mg by mouth daily.   ? sucralfate (CARAFATE) 1 GM/10ML suspension Take 10 mLs (1 g total) by mouth 4 (four) times daily -  with meals and at bedtime.  ? triamcinolone (KENALOG) 0.1 % Apply 1 application topically daily as needed (irritation).  ? venlafaxine XR (EFFEXOR-XR) 150 MG 24 hr capsule Take total of 225 mg daily (150 mg + 75 mg )  ? venlafaxine XR (EFFEXOR-XR) 75 MG 24 hr capsule To take along with 133m for a total of 225 mg daily  ? Vitamin D, Ergocalciferol, (DRISDOL) 1.25 MG (50000 UNIT) CAPS capsule Take 1 capsule (50,000 Units total) by mouth every 7 (seven) days.  ? [DISCONTINUED] insulin regular human CONCENTRATED (HUMULIN R U-500 KWIKPEN) 500 UNIT/ML KwikPen Inject 80 Units into the skin 3 (three) times daily with meals.  ? clonazePAM (KLONOPIN) 0.5 MG tablet Take 1 tablet (0.5 mg total) by mouth 2 (two) times daily as needed for anxiety. (Patient taking differently: Take 0.5 mg by mouth 2 (two) times daily.)  ? insulin regular human CONCENTRATED (HUMULIN R U-500 KWIKPEN) 500 UNIT/ML KwikPen Inject 70-80 Units into the skin 3 (three) times daily with meals. Inject 80 units with breakfast and lunch and 70 with supper if glucose is above 90 and eating  ? ?No facility-administered encounter medications on file as of 02/10/2022.  ? ? ?ALLERGIES: ?Allergies  ?Allergen Reactions  ? Iodine-131 Anaphylaxis, Shortness Of Breath and Swelling  ? Ivp Dye [Iodinated Contrast Media] Anaphylaxis  ?  Skin gets very red, unable to walk ?  ? Ketorolac Tromethamine Shortness Of Breath  ? Tylenol [Acetaminophen] Other (See Comments)  ?  Due to cirrhosis ?  ? Gabapentin   ? Morphine And Related   ?  Pt says she is not allergic to Morphine  08/12/20  ? Nsaids Other (See Comments)  ?  Flares ulcers  ? Suboxone [Buprenorphine Hcl-Naloxone Hcl]   ? Vancomycin Swelling  ?  Facial swelling,   ? ? ?VACCINATION STATUS: ?Immunization History  ?Administered Date(

## 2022-02-10 NOTE — Patient Instructions (Signed)

## 2022-02-16 ENCOUNTER — Encounter (INDEPENDENT_AMBULATORY_CARE_PROVIDER_SITE_OTHER): Payer: Self-pay | Admitting: Gastroenterology

## 2022-02-16 ENCOUNTER — Ambulatory Visit (INDEPENDENT_AMBULATORY_CARE_PROVIDER_SITE_OTHER): Payer: Medicaid Other | Admitting: Gastroenterology

## 2022-02-16 VITALS — BP 109/70 | HR 102 | Temp 98.9°F | Ht 70.0 in | Wt 287.3 lb

## 2022-02-16 DIAGNOSIS — K581 Irritable bowel syndrome with constipation: Secondary | ICD-10-CM

## 2022-02-16 DIAGNOSIS — K589 Irritable bowel syndrome without diarrhea: Secondary | ICD-10-CM | POA: Insufficient documentation

## 2022-02-16 DIAGNOSIS — K7460 Unspecified cirrhosis of liver: Secondary | ICD-10-CM

## 2022-02-16 DIAGNOSIS — K625 Hemorrhage of anus and rectum: Secondary | ICD-10-CM

## 2022-02-16 DIAGNOSIS — Z79891 Long term (current) use of opiate analgesic: Secondary | ICD-10-CM

## 2022-02-16 DIAGNOSIS — R112 Nausea with vomiting, unspecified: Secondary | ICD-10-CM | POA: Diagnosis not present

## 2022-02-16 HISTORY — DX: Irritable bowel syndrome, unspecified: K58.9

## 2022-02-16 HISTORY — DX: Long term (current) use of opiate analgesic: Z79.891

## 2022-02-16 MED ORDER — PROMETHAZINE HCL 12.5 MG PO TABS: 12.5000 mg | ORAL_TABLET | Freq: Three times a day (TID) | ORAL | 2 refills | Status: DC | PRN

## 2022-02-16 NOTE — Patient Instructions (Addendum)
Schedule colonoscopy - will need to be on a 5 day liquid diet prior to procedure and a 2 day bowel prep ?Start taking Miralax 2 capfusl every day for one week. If bowel movements, increase to 3 capfuls every day ?Start Phenergan as needed for nausea ?Stop Zofran ?Perform blood workup ?Continue furosemide and spironolactone at same dose ?- Reduce salt intake to <2 g per day ?- Can take Tylenol max of 2 g per day (650 mg q8h) for pain ?- Avoid NSAIDs for pain ?- Avoid eating raw oysters/shellfish ?- Protein shake (Ensure or Boost) every night before going to sleep ?

## 2022-02-16 NOTE — Progress Notes (Addendum)
Jaime [name], M.D. ?Gastroenterology & Hepatology ?[facility] ?[address] ?[place], [place] [address] ? ?Primary Care Physician: ?Neale Burly, MD ?[address] ?[place] [address] ? ?I will communicate my assessment and recommendations to the referring MD via EMR. ? 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  ?History of Present Illness: ?Jaime [name] is a 36 y.o. 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 ? ?The patient was last seen on 11/17/2021. At that time, the patient was scheduled for an EGD and colonoscopy with findings described below.  She was given Zofran as needed for nausea and Levsin as needed for episodes of pain.  She was also advised to continue propranolol 10 g 3 times daily. ? ?She reports having recurrent abdominal pain in her upper abdominal area despite taking omeprazole 40 mg twice a day and Levsin. She does not have  ? ?Patient has been vomiting every other day which she feels makes her abdomen hurt. She takes Zofran as needed for nausea but does not feel it helps with her nausea.  ? ?She has presented recurrent bleeding in her stool with every bowel movement. ? ?The patient denies having any fever, chills, hematochezia, melena, hematemesis, abdominal distention, abdominal pain, diarrhea, jaundice, pruritus. Has lost 15 lb in the last months as she was switched to a different insulin and after starting Ozempic. ? ?Patient comes to the appointment with mother, who states that after she had a diabetic coma 2 years ago she has been dependent on her family. ? ?Cirrhosis related questions: ?Hematemesis/coffee ground emesis:  No ?History of variceal bleeding: No ?Abdominal pain: Yes ?Abdominal distention/worsening ascitesNo ?Fever/chills: No ?Episodes of confusion/disorientation: Yes, this has been present since she had her hospitalization for diabetic coma in the past ?Number of daily bowel movements:every other day ?Taking diuretics?:  Yes, furosemide 40 mg every day, spironolactone 100 mg every day ?Prior history of banding?: No ?Prior episodes of SBP: No ?Last time liver imaging was performed: 08/07/2021-no presence of mass in RUQ ultrasound ?Notably, she had a CT of the abdomen pelvis without IV contrast on 11/08/2021 which did not show any acute abnormalities explaining her episodes of pain. ?MELD score: 11/24/2021 - 11 ? ? ?Last EGD: 11/25/2021 ?No endoscopic abnormality was evident in the esophagus to explain the patient's complaint of dysphagia. ?Upon careful inspection, no varices were observed. 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 Upon careful inspection, no gastric varices were observed. ?Five non-bleeding cratered gastric ulcers with a clean ulcer base (Forrest Class III) were found in the gastric antrum. The largest lesion was 6 mm in largest dimension. Biopsies from body and antrum were taken with a cold forceps for Helicobacter pylori testing. ?The examined duodenum was normal. Biopsies were taken with a cold forceps for histology. ?Dysphagia likely related to methadone use ? ?Path:  ?A. SMALL BOWEL, BIOPSY:  ?Benign duodenal mucosa with no diagnostic abnormality  ? ?B. STOMACH, BIOPSY:  ?Reactive gastropathy with foveolar hyperplasia and changes compatible  ?with healed erosion/ulceration  ?Negative for H. pylori, intestinal metaplasia, dysplasia and carcinoma  ? ?Poor preparation, Hemorrhoids were found on perianal exam ? ?Past Medical History: ?  Past Medical History:  ?Diagnosis Date  ? Anxiety   ? Asthma   ? Chronic abdominal pain   ? Chronic back pain   ? Cirrhosis ([place])   ?  Cirrhosis ([place])   ? COPD (chronic obstructive pulmonary disease) ([place])   ? Depression   ? Diabetes mellitus without complication ([place])   ? Diabetes mellitus, type II ([place])   ? Hepatitis C   ? Hyperlipidemia   ? Insomnia   ? Long-term current use of methadone for opiate dependence ([place])   ? Lupus ([place])   ? Migraine headache   ? Neuropathy   ? Nocturnal seizures ([place])   ? Peptic ulcer   ? Spleen enlarged   ? ? ?Past Surgical History: ?Past Surgical History:  ?Procedure Laterality Date  ? BIOPSY  11/25/2021  ? Procedure: BIOPSY;  Surgeon: Harvel Quale, MD;  [facility];  Service: Gastroenterology;;  ? CHOLECYSTECTOMY    ? COLONOSCOPY WITH PROPOFOL N/A 11/25/2021  ? Procedure: COLONOSCOPY WITH PROPOFOL;  Surgeon: Harvel Quale, MD;  [facility];  Service: Gastroenterology;  Laterality: N/A;  805  ? ESOPHAGOGASTRODUODENOSCOPY  06/2020  ? done at [facility], candida in upper esophagus (treated with diflucan), ulcerative esophagitis at GE junction, gastritis in stomach, single ulcer in duodenal bulb, with duodenal mucosa showing no abnormality. No presence of varices  ? ESOPHAGOGASTRODUODENOSCOPY (EGD) WITH PROPOFOL N/A 11/25/2021  ? Procedure: ESOPHAGOGASTRODUODENOSCOPY (EGD) WITH PROPOFOL;  Surgeon: Harvel Quale, MD;  [facility];  Service: Gastroenterology;  Laterality: N/A;  ? ? ?Family History: ?Family History  ?Problem Relation Age of Onset  ? Hypertension Mother   ? Hyperlipidemia Mother   ? Hyperlipidemia Maternal Grandfather   ? ? ?Social History: ?Social History  ? ?Tobacco Use  ?Smoking Status Every Day  ? Types: E-cigarettes  ?Smokeless Tobacco Never  ? ?Social History  ? ?Substance and Sexual Activity  ?Alcohol Use No  ? ?Social History  ? ?Substance and Sexual Activity  ?Drug Use No  ? ? ?Allergies: ?Allergies  ?Allergen Reactions  ? Iodine-131 Anaphylaxis, Shortness Of Breath and Swelling  ? Ivp Dye [Iodinated Contrast Media] Anaphylaxis  ?   Skin gets very red, unable to walk ?  ? Ketorolac Tromethamine Shortness Of Breath  ? Tylenol [Acetaminophen] Other (See Comments)  ?  Due to cirrhosis ?  ? Gabapentin   ? Morphine And Related   ?  Pt says she is not allergic to Morphine 08/12/20  ? Nsaids Other (See Comments)  ?  Flares ulcers  ? Suboxone [Buprenorphine Hcl-Naloxone Hcl]   ? Vancomycin Swelling  ?  Facial swelling,   ? ? ?Medications: ?Current Outpatient Medications  ?Medication Sig Dispense Refill  ? Accu-Chek Softclix Lancets lancets USE TO TEST BLOOD SUGAR 4 TIMES A DAY. 400 each 1  ? albuterol (ACCUNEB) 0.63 MG/3ML nebulizer solution Take 1 ampule by nebulization every 6 (six) hours as needed for wheezing.    ? ARIPiprazole (ABILIFY) 20 MG tablet Take 20 mg by mouth daily.    ? aspirin EC 81 MG tablet Take 81 mg by mouth daily. Swallow whole.    ? blood glucose meter kit and supplies Dispense based on patient and insurance preference. Use up to four times daily as directed. (FOR ICD-10 E10.9, E11.9). 1 each 0  ? cetirizine (ZYRTEC ALLERGY) 10 MG tablet Take 1 tablet (10 mg total) by mouth daily. 30 tablet 0  ? clonazePAM (KLONOPIN) 0.5 MG tablet Take 1 tablet (  0.5 mg total) by mouth 2 (two) times daily as needed for anxiety. (Patient taking differently: Take 0.5 mg by mouth 2 (two) times daily.) 60 tablet 2  ? Continuous Blood Gluc Sensor (DEXCOM G6 SENSOR) MISC 4 Pieces by Does not apply route once a week. 4 each 2  ? Continuous Blood Gluc Transmit (DEXCOM G6 TRANSMITTER) MISC USE AS DIRECTED. 1 each 1  ? cromolyn (OPTICROM) 4 % ophthalmic solution Place 1 drop into both eyes 2 (two) times daily.    ? etonogestrel (NEXPLANON) 68 MG IMPL implant 1 each by Subdermal route once.    ? furosemide (LASIX) 40 MG tablet Take 40 mg by mouth daily.    ? glipiZIDE (GLUCOTROL XL) 5 MG 24 hr tablet Take 1 tablet (5 mg total) by mouth daily with breakfast. 90 tablet 1  ? glucose blood (ACCU-CHEK GUIDE) test strip Use as instructed to check blood glucose  four times daily 150 each 5  ? hyoscyamine (ANASPAZ) 0.125 MG TBDP disintergrating tablet TAKE 1 TABLET UNDER THE TONGUE THREE TIMES A DAY AS NEEDED FOR BLADDER SPASMS OR CRAMPING. 90 tablet 0  ? Insulin Pen

## 2022-02-18 ENCOUNTER — Telehealth (INDEPENDENT_AMBULATORY_CARE_PROVIDER_SITE_OTHER): Payer: Self-pay

## 2022-02-18 ENCOUNTER — Encounter (INDEPENDENT_AMBULATORY_CARE_PROVIDER_SITE_OTHER): Payer: Self-pay

## 2022-02-18 ENCOUNTER — Other Ambulatory Visit (INDEPENDENT_AMBULATORY_CARE_PROVIDER_SITE_OTHER): Payer: Self-pay

## 2022-02-18 LAB — AFP TUMOR MARKER: AFP-Tumor Marker: 5.9 ng/mL

## 2022-02-18 MED ORDER — PEG 3350-KCL-NA BICARB-NACL 420 G PO SOLR: 4000.0000 mL | ORAL | 0 refills | Status: DC

## 2022-02-18 NOTE — Telephone Encounter (Signed)
Galia Rahm Ann Veronnica Hennings, CMA  ?

## 2022-02-19 ENCOUNTER — Encounter (INDEPENDENT_AMBULATORY_CARE_PROVIDER_SITE_OTHER): Payer: Self-pay

## 2022-02-27 ENCOUNTER — Ambulatory Visit (HOSPITAL_COMMUNITY): Admission: RE | Admit: 2022-02-27 | Payer: Medicaid Other | Source: Ambulatory Visit

## 2022-03-04 ENCOUNTER — Other Ambulatory Visit (INDEPENDENT_AMBULATORY_CARE_PROVIDER_SITE_OTHER): Payer: Self-pay | Admitting: Gastroenterology

## 2022-03-04 DIAGNOSIS — R1084 Generalized abdominal pain: Secondary | ICD-10-CM

## 2022-03-05 ENCOUNTER — Telehealth: Payer: Self-pay

## 2022-03-05 NOTE — Telephone Encounter (Signed)
Faxed [employer] prior authorization form for initial authorization for the [employer] to [phone]. ?

## 2022-03-16 ENCOUNTER — Other Ambulatory Visit (INDEPENDENT_AMBULATORY_CARE_PROVIDER_SITE_OTHER): Payer: Self-pay | Admitting: Gastroenterology

## 2022-03-18 ENCOUNTER — Other Ambulatory Visit (INDEPENDENT_AMBULATORY_CARE_PROVIDER_SITE_OTHER): Payer: Self-pay

## 2022-03-18 DIAGNOSIS — K625 Hemorrhage of anus and rectum: Secondary | ICD-10-CM

## 2022-03-18 DIAGNOSIS — K259 Gastric ulcer, unspecified as acute or chronic, without hemorrhage or perforation: Secondary | ICD-10-CM

## 2022-03-25 NOTE — Patient Instructions (Addendum)
Jaime [name]  03/25/2022     @PREFPERIOPPHARMACY @   Your procedure is scheduled on 03/31/2022.  Report to [facility] at 6:00 A.M.  Call this number if you have problems the morning of surgery:  [phone]   Remember:   Please follow the diet and prep instructions given to you by the doctors office.      Take these medicines the morning of surgery with A SIP OF WATER : Abilify, Zyrtec, Klonopin, Propranolol, Methadone, Prilosec, Lyrica, Effexor and Phenergan if needed.     Do not wear jewelry, make-up or nail polish.  Do not wear lotions, powders, or perfumes, or deodorant.  Do not shave 48 hours prior to surgery.  Men may shave face and neck.  Do not bring valuables to the hospital.  [facility] is not responsible for any belongings or valuables.  Contacts, dentures or bridgework may not be worn into surgery.  Leave your suitcase in the car.  After surgery it may be brought to your room.  For patients admitted to the hospital, discharge time will be determined by your treatment team.  Patients discharged the day of surgery will not be allowed to drive home.   Name and phone number of your driver:   family Special instructions:  N/A  Please read over the following fact sheets that you were given. Care and Recovery After Surgery  Monitored Anesthesia Care Anesthesia refers to techniques, procedures, and medicines that help a person stay safe and comfortable during a medical or dental procedure. Monitored anesthesia care, or sedation, is one type of anesthesia. Your anesthesia specialist may recommend sedation if you will be having a procedure that does not require you to be unconscious. You may have this procedure for: Cataract surgery. A dental procedure. A biopsy. A colonoscopy. During the procedure, you may receive a medicine to help you relax (sedative). There are three levels of sedation: Mild sedation. At this level, you may feel awake and relaxed. You will be able  to follow directions. Moderate sedation. At this level, you will be sleepy. You may not remember the procedure. Deep sedation. At this level, you will be asleep. You will not remember the procedure. The more medicine you are given, the deeper your level of sedation will be. Depending on how you respond to the procedure, the anesthesia specialist may change your level of sedation or the type of anesthesia to fit your needs. An anesthesia specialist will monitor you closely during the procedure. Tell a health care provider about: Any allergies you have. All medicines you are taking, including vitamins, herbs, eye drops, creams, and over-the-counter medicines. Any problems you or family members have had with anesthetic medicines. Any blood disorders you have. Any surgeries you have had. Any medical conditions you have, such as sleep apnea. Whether you are pregnant or may be pregnant. Whether you use cigarettes, alcohol, or drugs. Any use of steroids, whether by mouth or as a cream. What are the risks? Generally, this is a safe procedure. However, problems may occur, including: Getting too much medicine (oversedation). Nausea. Allergic reaction to medicines. Trouble breathing. If this happens, a breathing tube may be used to help with breathing. It will be removed when you are awake and breathing on your own. Heart trouble. Lung trouble. Confusion that gets better with time (emergence delirium). What happens before the procedure? Staying hydrated Follow instructions from your health care provider about hydration, which may include: Up to 2 hours before the procedure - you  may continue to drink clear liquids, such as water, clear fruit juice, black coffee, and plain tea. Eating and drinking restrictions Follow instructions from your health care provider about eating and drinking, which may include: 8 hours before the procedure - stop eating heavy meals or foods, such as meat, fried foods, or  fatty foods. 6 hours before the procedure - stop eating light meals or foods, such as toast or cereal. 6 hours before the procedure - stop drinking milk or drinks that contain milk. 2 hours before the procedure - stop drinking clear liquids. Medicines Ask your health care provider about: Changing or stopping your regular medicines. This is especially important if you are taking diabetes medicines or blood thinners. Taking medicines such as aspirin and ibuprofen. These medicines can thin your blood. Do not take these medicines unless your health care provider tells you to take them. Taking over-the-counter medicines, vitamins, herbs, and supplements. Tests and exams You will have a physical exam. You may have blood tests done to show: How well your kidneys and liver are working. How well your blood can clot. General instructions Plan to have a responsible adult take you home from the hospital or clinic. If you will be going home right after the procedure, plan to have a responsible adult care for you for the time you are told. This is important. What happens during the procedure?  Your blood pressure, heart rate, breathing, level of pain, and overall condition will be monitored. An IV will be inserted into one of your veins. You will be given medicines as needed to keep you comfortable during the procedure. This may mean changing the level of sedation. Depending on your age or the procedure, the sedative may be given: As a pill that you will swallow or as a pill that is inserted into the rectum. As an injection into the vein or muscle. As a spray through the nose. The procedure will be performed. Your breathing, heart rate, and blood pressure will be monitored during the procedure. When the procedure is over, the medicine will be stopped. The procedure may vary among health care providers and hospitals. What happens after the procedure? Your blood pressure, heart rate, breathing rate,  and blood oxygen level will be monitored until you leave the hospital or clinic. You may feel sleepy, clumsy, or nauseous. You may feel forgetful about what happened after the procedure. You may vomit. You may continue to get IV fluids. Do not drive or operate machinery until your health care provider says that it is safe. Summary Monitored anesthesia care is used to keep a patient comfortable during short procedures. Tell your health care provider about any allergies or health conditions you have and about all the medicines you are taking. Before the procedure, follow instructions about when to stop eating and drinking and about changing or stopping any medicines. Your blood pressure, heart rate, breathing rate, and blood oxygen level will be monitored until you leave the hospital or clinic. Plan to have a responsible adult take you home from the hospital or clinic. This information is not intended to replace advice given to you by your health care provider. Make sure you discuss any questions you have with your health care provider. Document Revised: 09/23/2021 Document Reviewed: 09/21/2019 Elsevier Patient Education  [place] Endoscopy, Adult Upper endoscopy is a procedure to look inside the upper GI (gastrointestinal) tract. The upper GI tract is made up of: The esophagus. This is the part of the  body that moves food from your mouth to your stomach. The stomach. The duodenum. This is the first part of your small intestine. This procedure is also called esophagogastroduodenoscopy (EGD) or gastroscopy. In this procedure, your health care provider passes a thin, flexible tube (endoscope) through your mouth and down your esophagus into your stomach and into your duodenum. A small camera is attached to the end of the tube. Images from the camera appear on a monitor in the exam room. During this procedure, your health care provider may also remove a small piece of tissue to be sent  to a lab and examined under a microscope (biopsy). Your health care provider may do an upper endoscopy to diagnose cancers of the upper GI tract. You may also have this procedure to find the cause of other conditions, such as: Stomach pain. Heartburn. Pain or problems when swallowing. Nausea and vomiting. Stomach bleeding. Stomach ulcers. Tell a health care provider about: Any allergies you have. All medicines you are taking, including vitamins, herbs, eye drops, creams, and over-the-counter medicines. Any problems you or family members have had with anesthetic medicines. Any bleeding problems you have. Any surgeries you have had. Any medical conditions you have. Whether you are pregnant or may be pregnant. What are the risks? Generally, this is a safe procedure. However, problems may occur, including: Infection. Bleeding. Allergic reactions to medicines. A tear or hole (perforation) in the esophagus, stomach, or duodenum. What happens before the procedure? When to stop eating and drinking Follow instructions from your health care provider about what you may eat and drink before your procedure. These may include: 8 hours before your procedure Stop eating most foods. Do not eat meat, fried foods, or fatty foods. Eat only light foods, such as toast or crackers. All liquids are okay except energy drinks and alcohol. 6 hours before your procedure Stop eating. Drink only clear liquids, such as water, clear fruit juice, black coffee, plain tea, and sports drinks. Do not drink energy drinks or alcohol. 2 hours before your procedure Stop drinking all liquids. You may be allowed to take medicines with small sips of water. If you do not follow your health care provider's instructions, your procedure may be delayed or canceled. Medicines Ask your health care provider about: Changing or stopping your regular medicines. This is especially important if you are taking diabetes medicines or  blood thinners. Taking medicines such as aspirin and ibuprofen. These medicines can thin your blood. Do not take these medicines unless your health care provider tells you to take them. Taking over-the-counter medicines, vitamins, herbs, and supplements. General instructions If you will be going home right after the procedure, plan to have a responsible adult: Take you home from the hospital or clinic. You will not be allowed to drive. Care for you for the time you are told. What happens during the procedure?  An IV will be inserted into one of your veins. You may be given one or more of the following: A medicine to help you relax (sedative). A medicine to numb the throat (local anesthetic). You will lie on your left side on an exam table. Your health care provider will pass the endoscope through your mouth and down your esophagus. Your health care provider will use the scope to check the inside of your esophagus, stomach, and duodenum. Biopsies may be taken. The endoscope will be removed. The procedure may vary among health care providers and hospitals. What can I expect after the procedure? After your  procedure, it is common to have: A sore throat. Mild stomach pain or discomfort. Bloating. Nausea. When your throat is no longer numb, you may be given some fluids to drink. Your blood pressure, heart rate, breathing rate, and blood oxygen level will be monitored until you leave the hospital or clinic. It is up to you to get the results of your procedure. Ask your health care provider, or the department that is doing the procedure, when your results will be ready. Follow these instructions at home:  Follow instructions from your health care provider about eating or drinking restrictions. Take over-the-counter and prescription medicines only as told by your health care provider. Return to your normal activities as told by your health care provider. Ask your health care provider what  activities are safe for you. If you were given a sedative during the procedure, it can affect you for several hours. Do not drive or operate machinery until your health care provider says that it is safe. Keep all follow-up visits. This is important. Contact a health care provider if: You have a sore throat that lasts longer than 1 day. You have trouble swallowing. Get help right away if: You vomit blood or your vomit looks like coffee grounds. You have a fever. You have bloody, black, or tarry stools. You have a severe sore throat or you cannot swallow. You have difficulty breathing or severe pain in your chest. These symptoms may be an emergency. Get help right away. Call 911. Do not wait to see if the symptoms will go away. Do not drive yourself to the hospital. Summary Upper endoscopy is a procedure to look inside the upper GI tract. During the procedure, an IV will be inserted into one of your veins. You may be given a medicine to help you relax. The endoscope will be passed through your mouth and down your esophagus. After the procedure, follow instructions from your health care provider about eating or drinking restrictions. This information is not intended to replace advice given to you by your health care provider. Make sure you discuss any questions you have with your health care provider. Document Revised: 07/15/2021 Document Reviewed: 07/15/2021 Elsevier Patient Education  [place]. Colonoscopy, Adult A colonoscopy is a procedure to look at the entire large intestine. This procedure is done using a long, thin, flexible tube that has a camera on the end. You may have a colonoscopy: As a part of normal colorectal screening. If you have certain symptoms, such as: A low number of red blood cells in your blood (anemia). Diarrhea that does not go away. Pain in your abdomen. Blood in your stool. A colonoscopy can help screen for and diagnose medical problems,  including: An abnormal growth of cells or tissue (tumor). Abnormal growths within the lining of your intestine (polyps). Inflammation. Areas of bleeding. Tell your health care provider about: Any allergies you have. All medicines you are taking, including vitamins, herbs, eye drops, creams, and over-the-counter medicines. Any problems you or family members have had with anesthetic medicines. Any bleeding problems you have. Any surgeries you have had. Any medical conditions you have. Any problems you have had with having bowel movements. Whether you are pregnant or may be pregnant. What are the risks? Generally, this is a safe procedure. However, problems may occur, including: Bleeding. Damage to your intestine. Allergic reactions to medicines given during the procedure. Infection. This is rare. What happens before the procedure? Eating and drinking restrictions Follow instructions from your health  care provider about eating or drinking restrictions, which may include: A few days before the procedure: Follow a low-fiber diet. Avoid nuts, seeds, dried fruit, raw fruits, and vegetables. 1-3 days before the procedure: Eat only gelatin dessert or ice pops. Drink only clear liquids, such as water, clear juice, clear broth or bouillon, black coffee or tea, or clear soft drinks or sports drinks. Avoid liquids that contain red or purple dye. The day of the procedure: Do not eat solid foods. You may continue to drink clear liquids until up to 2 hours before the procedure. Do not eat or drink anything starting 2 hours before the procedure, or within the time period that your health care provider recommends. Bowel prep If you were prescribed a bowel prep to take by mouth (orally) to clean out your colon: Take it as told by your health care provider. Starting the day before your procedure, you will need to drink a large amount of liquid medicine. The liquid will cause you to have many bowel  movements of loose stool until your stool becomes almost clear or light green. If your skin or the opening between the buttocks (anus) gets irritated from diarrhea, you may relieve the irritation using: Wipes with medicine in them, such as adult wet wipes with aloe and vitamin E. A product to soothe skin, such as petroleum jelly. If you vomit while drinking the bowel prep: Take a break for up to 60 minutes. Begin the bowel prep again. Call your health care provider if you keep vomiting or you cannot take the bowel prep without vomiting. To clean out your colon, you may also be given: Laxative medicines. These help you have a bowel movement. Instructions for enema use. An enema is liquid medicine injected into your rectum. Medicines Ask your health care provider about: Changing or stopping your regular medicines or supplements. This is especially important if you are taking iron supplements, diabetes medicines, or blood thinners. Taking medicines such as aspirin and ibuprofen. These medicines can thin your blood. Do not take these medicines unless your health care provider tells you to take them. Taking over-the-counter medicines, vitamins, herbs, and supplements. General instructions Ask your health care provider what steps will be taken to help prevent infection. These may include washing skin with a germ-killing soap. If you will be going home right after the procedure, plan to have a responsible adult: Take you home from the hospital or clinic. You will not be allowed to drive. Care for you for the time you are told. What happens during the procedure?  An IV will be inserted into one of your veins. You will be given a medicine to make you fall asleep (general anesthetic). You will lie on your side with your knees bent. A lubricant will be put on the tube. Then the tube will be: Inserted into your anus. Gently eased through all parts of your large intestine. Air will be sent into your  colon to keep it open. This may cause some pressure or cramping. Images will be taken with the camera and will appear on a screen. A small tissue sample may be removed to be looked at under a microscope (biopsy). The tissue may be sent to a lab for testing if any signs of problems are found. If small polyps are found, they may be removed and checked for cancer cells. When the procedure is finished, the tube will be removed. The procedure may vary among health care providers and hospitals. What happens after  the procedure? Your blood pressure, heart rate, breathing rate, and blood oxygen level will be monitored until you leave the hospital or clinic. You may have a small amount of blood in your stool. You may pass gas and have mild cramping or bloating in your abdomen. This is caused by the air that was used to open your colon during the exam. If you were given a sedative during the procedure, it can affect you for several hours. Do not drive or operate machinery until your health care provider says that it is safe. It is up to you to get the results of your procedure. Ask your health care provider, or the department that is doing the procedure, when your results will be ready. Summary A colonoscopy is a procedure to look at the entire large intestine. Follow instructions from your health care provider about eating and drinking before the procedure. If you were prescribed an oral bowel prep to clean out your colon, take it as told by your health care provider. During the colonoscopy, a flexible tube with a camera on its end is inserted into the anus and then passed into all parts of the large intestine. This information is not intended to replace advice given to you by your health care provider. Make sure you discuss any questions you have with your health care provider. Document Revised: 10/13/2021 Document Reviewed: 06/11/2021 Elsevier Patient Education  [place].

## 2022-03-27 ENCOUNTER — Encounter (HOSPITAL_COMMUNITY): Payer: Self-pay

## 2022-03-27 ENCOUNTER — Encounter (HOSPITAL_COMMUNITY)
Admission: RE | Admit: 2022-03-27 | Discharge: 2022-03-27 | Disposition: A | Discharge status: Home / Self Care | Payer: Medicaid Other | Source: Ambulatory Visit | Attending: Gastroenterology | Admitting: Gastroenterology

## 2022-03-27 VITALS — BP 122/71 | HR 95 | Temp 98.3°F | Resp 18 | Ht 70.0 in | Wt 283.0 lb

## 2022-03-27 DIAGNOSIS — K7460 Unspecified cirrhosis of liver: Secondary | ICD-10-CM | POA: Diagnosis not present

## 2022-03-27 DIAGNOSIS — K259 Gastric ulcer, unspecified as acute or chronic, without hemorrhage or perforation: Secondary | ICD-10-CM | POA: Diagnosis not present

## 2022-03-27 DIAGNOSIS — Z01812 Encounter for preprocedural laboratory examination: Secondary | ICD-10-CM | POA: Insufficient documentation

## 2022-03-27 DIAGNOSIS — Z01818 Encounter for other preprocedural examination: Secondary | ICD-10-CM

## 2022-03-27 DIAGNOSIS — R112 Nausea with vomiting, unspecified: Secondary | ICD-10-CM

## 2022-03-27 DIAGNOSIS — R1319 Other dysphagia: Secondary | ICD-10-CM

## 2022-03-27 DIAGNOSIS — R1110 Vomiting, unspecified: Secondary | ICD-10-CM

## 2022-03-27 DIAGNOSIS — K625 Hemorrhage of anus and rectum: Secondary | ICD-10-CM

## 2022-03-27 DIAGNOSIS — K589 Irritable bowel syndrome without diarrhea: Secondary | ICD-10-CM

## 2022-03-27 DIAGNOSIS — R739 Hyperglycemia, unspecified: Secondary | ICD-10-CM

## 2022-03-27 HISTORY — DX: Gastro-esophageal reflux disease without esophagitis: K21.9

## 2022-03-27 LAB — CBC
HCT: 39.4 % (ref 36.0–46.0)
Hemoglobin: 12.8 g/dL (ref 12.0–15.0)
MCH: 31.1 pg (ref 26.0–34.0)
MCHC: 32.5 g/dL (ref 30.0–36.0)
MCV: 95.6 fL (ref 80.0–100.0)
Platelets: 45 10*3/uL — ABNORMAL LOW (ref 150–400)
RBC: 4.12 MIL/uL (ref 3.87–5.11)
RDW: 15.6 % — ABNORMAL HIGH (ref 11.5–15.5)
WBC: 2.7 10*3/uL — ABNORMAL LOW (ref 4.0–10.5)
nRBC: 0 % (ref 0.0–0.2)

## 2022-03-27 LAB — BASIC METABOLIC PANEL
Anion gap: 8 (ref 5–15)
BUN: 9 mg/dL (ref 6–20)
CO2: 24 mmol/L (ref 22–32)
Calcium: 8.4 mg/dL — ABNORMAL LOW (ref 8.9–10.3)
Chloride: 105 mmol/L (ref 98–111)
Creatinine, Ser: 0.52 mg/dL (ref 0.44–1.00)
GFR, Estimated: >60 mL/min (ref >60)
Glucose, Bld: 215 mg/dL — ABNORMAL HIGH (ref 70–99)
Potassium: 3.5 mmol/L (ref 3.5–5.1)
Sodium: 137 mmol/L (ref 135–145)

## 2022-03-27 LAB — HCG, SERUM, QUALITATIVE: Preg, Serum: NEGATIVE

## 2022-03-27 LAB — APTT: aPTT: 28 seconds (ref 24–36)

## 2022-03-27 LAB — PROTIME-INR
INR: 1.2 (ref 0.8–1.2)
Prothrombin Time: 14.7 seconds (ref 11.4–15.2)

## 2022-03-31 ENCOUNTER — Encounter (HOSPITAL_COMMUNITY): Payer: Self-pay | Admitting: Gastroenterology

## 2022-03-31 ENCOUNTER — Ambulatory Visit (HOSPITAL_COMMUNITY)
Admission: RE | Admit: 2022-03-31 | Discharge: 2022-03-31 | Disposition: A | Discharge status: Home / Self Care | Payer: Medicaid Other | Attending: Gastroenterology | Admitting: Gastroenterology

## 2022-03-31 ENCOUNTER — Encounter (HOSPITAL_COMMUNITY)
Admission: RE | Disposition: A | Discharge status: Home / Self Care | Payer: Self-pay | Source: Home / Self Care | Attending: Gastroenterology

## 2022-03-31 ENCOUNTER — Ambulatory Visit (HOSPITAL_BASED_OUTPATIENT_CLINIC_OR_DEPARTMENT_OTHER): Payer: Medicaid Other | Admitting: Anesthesiology

## 2022-03-31 ENCOUNTER — Ambulatory Visit (HOSPITAL_COMMUNITY): Payer: Medicaid Other | Admitting: Anesthesiology

## 2022-03-31 DIAGNOSIS — J449 Chronic obstructive pulmonary disease, unspecified: Secondary | ICD-10-CM | POA: Insufficient documentation

## 2022-03-31 DIAGNOSIS — Z8619 Personal history of other infectious and parasitic diseases: Secondary | ICD-10-CM | POA: Diagnosis not present

## 2022-03-31 DIAGNOSIS — K625 Hemorrhage of anus and rectum: Secondary | ICD-10-CM

## 2022-03-31 DIAGNOSIS — K7581 Nonalcoholic steatohepatitis (NASH): Secondary | ICD-10-CM | POA: Insufficient documentation

## 2022-03-31 DIAGNOSIS — K259 Gastric ulcer, unspecified as acute or chronic, without hemorrhage or perforation: Secondary | ICD-10-CM

## 2022-03-31 DIAGNOSIS — F419 Anxiety disorder, unspecified: Secondary | ICD-10-CM | POA: Insufficient documentation

## 2022-03-31 DIAGNOSIS — Z7984 Long term (current) use of oral hypoglycemic drugs: Secondary | ICD-10-CM | POA: Insufficient documentation

## 2022-03-31 DIAGNOSIS — Z8711 Personal history of peptic ulcer disease: Secondary | ICD-10-CM | POA: Insufficient documentation

## 2022-03-31 DIAGNOSIS — K7460 Unspecified cirrhosis of liver: Secondary | ICD-10-CM | POA: Diagnosis not present

## 2022-03-31 DIAGNOSIS — E119 Type 2 diabetes mellitus without complications: Secondary | ICD-10-CM

## 2022-03-31 DIAGNOSIS — F17290 Nicotine dependence, other tobacco product, uncomplicated: Secondary | ICD-10-CM | POA: Diagnosis not present

## 2022-03-31 DIAGNOSIS — F1120 Opioid dependence, uncomplicated: Secondary | ICD-10-CM | POA: Insufficient documentation

## 2022-03-31 DIAGNOSIS — E1140 Type 2 diabetes mellitus with diabetic neuropathy, unspecified: Secondary | ICD-10-CM | POA: Diagnosis not present

## 2022-03-31 DIAGNOSIS — Z794 Long term (current) use of insulin: Secondary | ICD-10-CM | POA: Diagnosis not present

## 2022-03-31 DIAGNOSIS — K921 Melena: Secondary | ICD-10-CM | POA: Insufficient documentation

## 2022-03-31 DIAGNOSIS — M329 Systemic lupus erythematosus, unspecified: Secondary | ICD-10-CM | POA: Diagnosis not present

## 2022-03-31 DIAGNOSIS — K317 Polyp of stomach and duodenum: Secondary | ICD-10-CM

## 2022-03-31 DIAGNOSIS — K219 Gastro-esophageal reflux disease without esophagitis: Secondary | ICD-10-CM | POA: Insufficient documentation

## 2022-03-31 DIAGNOSIS — K649 Unspecified hemorrhoids: Secondary | ICD-10-CM | POA: Diagnosis not present

## 2022-03-31 DIAGNOSIS — F32A Depression, unspecified: Secondary | ICD-10-CM | POA: Diagnosis not present

## 2022-03-31 DIAGNOSIS — R1033 Periumbilical pain: Secondary | ICD-10-CM | POA: Insufficient documentation

## 2022-03-31 HISTORY — PX: ESOPHAGOGASTRODUODENOSCOPY (EGD) WITH PROPOFOL: SHX5813

## 2022-03-31 HISTORY — PX: COLONOSCOPY WITH PROPOFOL: SHX5780

## 2022-03-31 LAB — CBC
HCT: 37.9 % (ref 36.0–46.0)
Hemoglobin: 12.9 g/dL (ref 12.0–15.0)
MCH: 31.8 pg (ref 26.0–34.0)
MCHC: 34 g/dL (ref 30.0–36.0)
MCV: 93.3 fL (ref 80.0–100.0)
Platelets: 55 10*3/uL — ABNORMAL LOW (ref 150–400)
RBC: 4.06 MIL/uL (ref 3.87–5.11)
RDW: 15.2 % (ref 11.5–15.5)
WBC: 3.6 10*3/uL — ABNORMAL LOW (ref 4.0–10.5)
nRBC: 0 % (ref 0.0–0.2)

## 2022-03-31 LAB — GLUCOSE, CAPILLARY
Glucose-Capillary: 118 mg/dL — ABNORMAL HIGH (ref 70–99)
Glucose-Capillary: 103 mg/dL — ABNORMAL HIGH (ref 70–99)
Glucose-Capillary: 40 mg/dL — CL (ref 70–99)

## 2022-03-31 LAB — TYPE AND SCREEN
ABO/RH(D): A POS
Antibody Screen: NEGATIVE

## 2022-03-31 LAB — HM COLONOSCOPY

## 2022-03-31 SURGERY — COLONOSCOPY WITH PROPOFOL
Anesthesia: General

## 2022-03-31 MED ORDER — LINACLOTIDE 290 MCG PO CAPS: 290.0000 ug | ORAL_CAPSULE | Freq: Every day | ORAL | 3 refills | Status: DC

## 2022-03-31 MED ORDER — LACTATED RINGERS IV SOLN: INTRAVENOUS | Status: DC

## 2022-03-31 MED ORDER — DEXTROSE 50 % IV SOLN
INTRAVENOUS | Status: AC
  Administered 2022-03-31: 50 mL via INTRAVENOUS
  Filled 2022-03-31: qty 50

## 2022-03-31 MED ORDER — PROPOFOL 500 MG/50ML IV EMUL
INTRAVENOUS | Status: DC | PRN
  Administered 2022-03-31: 75 ug/kg/min via INTRAVENOUS

## 2022-03-31 MED ORDER — LIDOCAINE HCL (CARDIAC) PF 50 MG/5ML IV SOSY
PREFILLED_SYRINGE | INTRAVENOUS | Status: DC | PRN
  Administered 2022-03-31: 100 mg via INTRAVENOUS

## 2022-03-31 MED ORDER — PROPOFOL 10 MG/ML IV BOLUS
INTRAVENOUS | Status: DC | PRN
  Administered 2022-03-31: 130 mg via INTRAVENOUS
  Administered 2022-03-31: 20 mg via INTRAVENOUS
  Administered 2022-03-31: 30 mg via INTRAVENOUS

## 2022-03-31 MED ORDER — DEXTROSE 50 % IV SOLN: 50.0000 mL | Freq: Once | INTRAVENOUS | Status: AC

## 2022-03-31 NOTE — H&P (Addendum)
Jaime [name] is an 36 y.o. female.   Chief Complaint: abdominal pain, rectal bleeding HPI: Jaime [name] is a 37 y.o. 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  The patient has presented recurrent episodes of rectal bleeding.  She was having these episodes on a daily basis in the past but this has decreased to every 2 to 3 days but it is still present frequently.  Denies any melena.  Has presented recurrent episodes of diffuse abdominal pain, worse in her mid abdomen with some nausea but no vomiting.  Past Medical History:  Diagnosis Date   Anxiety    Asthma    Chronic abdominal pain    Chronic back pain    Cirrhosis ([place])    Cirrhosis ([place])    COPD (chronic obstructive pulmonary disease) ([place])    Depression    Diabetes mellitus without complication ([place])    Diabetes mellitus, type II ([place])    GERD (gastroesophageal reflux disease)    Hepatitis C    Hyperlipidemia    Insomnia    Long-term current use of methadone for opiate dependence ([place])    Lupus ([place])    Migraine headache    Neuropathy    Nocturnal seizures ([place])    Peptic ulcer    Spleen enlarged     Past Surgical History:  Procedure Laterality Date   BIOPSY  11/25/2021   Procedure: BIOPSY;  Surgeon: Harvel Quale, MD;  [facility];  Service: Gastroenterology;;   CHOLECYSTECTOMY     COLONOSCOPY WITH PROPOFOL N/A 11/25/2021   Procedure: COLONOSCOPY WITH PROPOFOL;  Surgeon: Harvel Quale, MD;  Location: [facility];  Service: Gastroenterology;  Laterality: N/A;  15   ESOPHAGOGASTRODUODENOSCOPY  06/2020   done at [facility], candida in upper esophagus (treated with diflucan), ulcerative esophagitis at GE junction, gastritis in stomach, single  ulcer in duodenal bulb, with duodenal mucosa showing no abnormality. No presence of varices   ESOPHAGOGASTRODUODENOSCOPY (EGD) WITH PROPOFOL N/A 11/25/2021   Procedure: ESOPHAGOGASTRODUODENOSCOPY (EGD) WITH PROPOFOL;  Surgeon: Harvel Quale, MD;  [facility];  Service: Gastroenterology;  Laterality: N/A;    Family History  Problem Relation Age of Onset   Hypertension Mother    Hyperlipidemia Mother    Hyperlipidemia Maternal Grandfather    Social History:  reports that she has been smoking e-cigarettes. She has never used smokeless tobacco. She reports that she does not drink alcohol and does not use drugs.  Allergies:  Allergies  Allergen Reactions   Iodine-131 Anaphylaxis, Shortness Of Breath and Swelling   Ivp Dye [Iodinated Contrast Media] Anaphylaxis    Skin gets very red, unable to walk    Ketorolac Tromethamine Shortness Of Breath   Tylenol [Acetaminophen] Other (See Comments)    Due to cirrhosis    Vancomycin Swelling    Facial swelling,    Gabapentin Itching   Nsaids Other (See Comments)    Flares ulcers   Suboxone [Buprenorphine Hcl-Naloxone Hcl]     Withdrawal symptoms     Medications Prior to Admission  Medication Sig Dispense Refill   albuterol (ACCUNEB) 0.63 MG/3ML nebulizer solution Take 1 ampule by nebulization every 6 (six) hours as needed for wheezing.     ARIPiprazole (ABILIFY) 20 MG tablet Take 20 mg by  mouth daily.     aspirin EC 81 MG tablet Take 81 mg by mouth daily. Swallow whole.     cetirizine (ZYRTEC ALLERGY) 10 MG tablet Take 1 tablet (10 mg total) by mouth daily. 30 tablet 0   clonazePAM (KLONOPIN) 0.5 MG tablet Take 0.5 mg by mouth 2 (two) times daily.     cromolyn (OPTICROM) 4 % ophthalmic solution Place 1 drop into both eyes 2 (two) times daily.     etonogestrel (NEXPLANON) 68 MG IMPL implant 1 each by Subdermal route once.     furosemide (LASIX) 40 MG tablet Take 40 mg by mouth daily.     glipiZIDE (GLUCOTROL XL) 5 MG  24 hr tablet Take 1 tablet (5 mg total) by mouth daily with breakfast. 90 tablet 1   hyoscyamine (ANASPAZ) 0.125 MG TBDP disintergrating tablet TAKE 1 TABLET UNDER THE TONGUE THREE TIMES A DAY AS NEEDED FOR BLADDER SPASMS OR CRAMPING. 90 tablet 0   insulin regular human CONCENTRATED (HUMULIN R U-500 KWIKPEN) 500 UNIT/ML KwikPen Inject 70-80 Units into the skin 3 (three) times daily with meals. Inject 80 units with breakfast and lunch and 70 with supper if glucose is above 90 and eating 60 mL 2   lisinopril (ZESTRIL) 5 MG tablet TAKE 1 TABLET ONCE DAILY. 90 tablet 0   metFORMIN (GLUCOPHAGE) 1000 MG tablet Take 1 tablet (1,000 mg total) by mouth 2 (two) times daily with a meal. 180 tablet 1   methadone (DOLOPHINE) 10 MG/5ML solution Take 60 mg by mouth every morning.     Multiple Vitamin (MULTIVITAMIN) tablet Take 1 tablet by mouth daily.     nystatin (MYCOSTATIN/NYSTOP) powder Apply 1 application topically 3 (three) times daily. (Patient taking differently: Apply 1 application. topically 3 (three) times daily as needed (irritation).) 15 g 0   omeprazole (PRILOSEC) 40 MG capsule TAKE 1 CAPSULE BY MOUTH TWICE DAILY. 60 capsule 0   polyethylene glycol-electrolytes (TRILYTE) 420 g solution Take 4,000 mLs by mouth as directed. 4000 mL 0   potassium chloride (KLOR-CON M) 10 MEQ tablet Take 10 mEq by mouth 2 (two) times daily.     pregabalin (LYRICA) 75 MG capsule Take 75 mg by mouth in the morning, at noon, in the evening, and at bedtime.      PROAIR HFA 108 (90 Base) MCG/ACT inhaler Inhale 1-2 puffs into the lungs every 6 (six) hours as needed for wheezing or shortness of breath.     promethazine (PHENERGAN) 12.5 MG tablet Take 1 tablet (12.5 mg total) by mouth every 8 (eight) hours as needed for nausea or vomiting. 30 tablet 2   propranolol (INDERAL) 10 MG tablet Take 10 mg by mouth 3 (three) times daily.     RESTASIS 0.05 % ophthalmic emulsion Place 1 drop into both eyes as needed (dry eye).       rosuvastatin (CRESTOR) 5 MG tablet Take 1 tablet (5 mg total) by mouth daily. 90 tablet 3   Semaglutide, 1 MG/DOSE, 4 MG/3ML SOPN Inject 1 mg as directed once a week. (Patient taking differently: Inject 1 mg as directed every Tuesday.) 3 mL 3   spironolactone (ALDACTONE) 100 MG tablet Take 100 mg by mouth daily.      sucralfate (CARAFATE) 1 GM/10ML suspension Take 10 mLs (1 g total) by mouth 4 (four) times daily -  with meals and at bedtime. 420 mL 1   triamcinolone (KENALOG) 0.1 % Apply 1 application topically daily as needed (irritation).     venlafaxine XR (  EFFEXOR-XR) 150 MG 24 hr capsule Take total of 225 mg daily (150 mg + 75 mg ) 90 capsule 0   venlafaxine XR (EFFEXOR-XR) 75 MG 24 hr capsule To take along with 163m for a total of 225 mg daily 90 capsule 0   Vitamin D, Ergocalciferol, (DRISDOL) 1.25 MG (50000 UNIT) CAPS capsule Take 1 capsule (50,000 Units total) by mouth every 7 (seven) days. (Patient taking differently: Take 50,000 Units by mouth every Tuesday.) 12 capsule 0   Accu-Chek Softclix Lancets lancets USE TO TEST BLOOD SUGAR 4 TIMES A DAY. 400 each 1   blood glucose meter kit and supplies Dispense based on patient and insurance preference. Use up to four times daily as directed. (FOR ICD-10 E10.9, E11.9). 1 each 0   Continuous Blood Gluc Sensor (DEXCOM G6 SENSOR) MISC 4 Pieces by Does not apply route once a week. 4 each 2   Continuous Blood Gluc Transmit (DEXCOM G6 TRANSMITTER) MISC USE AS DIRECTED. 1 each 1   glucose blood (ACCU-CHEK GUIDE) test strip Use as instructed to check blood glucose four times daily 150 each 5   Insulin Pen Needle (GLOBAL EASE INJECT PEN NEEDLES) 31G X 8 MM MISC Use 1 pen needle 4 times daily for insulin. 400 each 0   Insulin Pen Needle (PEN NEEDLES) 32G X 6 MM MISC 1 each by Does not apply route 3 (three) times daily. 100 each 3    Results for orders placed or performed during the hospital encounter of 03/31/22 (from the past 48 hour(s))  CBC      Status: Abnormal   Collection Time: 03/31/22  6:33 AM  Result Value Ref Range   WBC 3.6 (L) 4.0 - 10.5 K/uL   RBC 4.06 3.87 - 5.11 MIL/uL   Hemoglobin 12.9 12.0 - 15.0 g/dL   HCT 37.9 36.0 - 46.0 %   MCV 93.3 80.0 - 100.0 fL   MCH 31.8 26.0 - 34.0 pg   MCHC 34.0 30.0 - 36.0 g/dL   RDW 15.2 11.5 - 15.5 %   Platelets 55 (L) 150 - 400 K/uL    Comment: SPECIMEN CHECKED FOR CLOTS Immature Platelet Fraction may be clinically indicated, consider ordering this additional test [id number] COUNT CONFIRMED BY SMEAR    nRBC 0.0 0.0 - 0.2 %    Comment: [facility] [address], [place] [id number] Glucose, capillary     Status: Abnormal   Collection Time: 03/31/22  6:51 AM  Result Value Ref Range   Glucose-Capillary 118 (H) 70 - 99 mg/dL    Comment: Glucose reference range applies only to samples taken after fasting for at least 8 hours.   No results found.  Review of Systems  Constitutional: Negative.   HENT: Negative.    Eyes: Negative.   Respiratory: Negative.    Cardiovascular: Negative.   Gastrointestinal:  Positive for abdominal pain and anal bleeding.  Endocrine: Negative.   Genitourinary: Negative.   Musculoskeletal: Negative.   Skin: Negative.   Allergic/Immunologic: Negative.   Neurological: Negative.   Hematological: Negative.   Psychiatric/Behavioral: Negative.     Blood pressure 124/77, pulse (!) 102, temperature 98.3 F (36.8 C), temperature source Oral, resp. rate 18, SpO2 95 %. Physical Exam  GENERAL: The patient is AO x3, in no acute distress. HEENT: Head is normocephalic and atraumatic. EOMI are intact. Mouth is well hydrated and without lesions. NECK: Supple. No masses LUNGS: Clear to auscultation. No presence of rhonchi/wheezing/rales. Adequate chest expansion  HEART: RRR, normal s1 and s2. ABDOMEN: Soft, nontender, no guarding, no peritoneal signs, and nondistended. BS +. No masses. EXTREMITIES: Without any cyanosis, clubbing,  rash, lesions or edema. NEUROLOGIC: AOx3, no focal motor deficit. SKIN: no jaundice, no rashes  Assessment/Plan Tniya Bowditch is a 36 y.o. 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  We will proceed with egd and colonoscopy.  Harvel Quale, MD 03/31/2022, 7:29 AM

## 2022-03-31 NOTE — Discharge Instructions (Addendum)
You are being discharged to home.  Resume your previous diet.  Eat small meals during the day. Can try edible peppermint oil/IBGard as needed for abdominal distention. Your physician has recommended a repeat colonoscopy at age 36 (please contact the clinic prior to your 43th birthday to schedule) for screening purposes.  Start Linzess 290 mcg qday

## 2022-03-31 NOTE — Op Note (Signed)
[facility] Patient Name: Jaime [name] Procedure Date: 03/31/2022 7:07 AM MRN: [id number] Date of Birth: [birth year] Attending MD: [name] ,  CSN: [id number] Age: 36 Admit Type: Outpatient Procedure:                Colonoscopy Indications:              Hematochezia Providers:                [name], Charlsie Quest. Theda Sers RN, RN, Casimer Bilis, Technician Referring MD:              Medicines:                Monitored Anesthesia Care Complications:            No immediate complications. Estimated Blood Loss:     Estimated blood loss: none. Procedure:                Pre-Anesthesia Assessment:                           - Prior to the procedure, a History and Physical                            was performed, and patient medications, allergies                            and sensitivities were reviewed. The patient's                            tolerance of previous anesthesia was reviewed.                           - The risks and benefits of the procedure and the                            sedation options and risks were discussed with the                            patient. All questions were answered and informed                            consent was obtained.                           - ASA Grade Assessment: III - A patient with severe                            systemic disease.                           After obtaining informed consent, the colonoscope                            was passed under direct vision. Throughout the  procedure, the patient's blood pressure, pulse, and                            oxygen saturations were monitored continuously. The                            [id number]-HQ190L ([id number]) scope was introduced through                            the anus with the intention of advancing to the                            ascending colon. The scope was advanced to the                             transverse colon before the procedure was aborted.                            Medications were given. The colonoscopy was                            performed with difficulty due to poor bowel prep.                            The patient tolerated the procedure fairly well.                            The quality of the bowel preparation was poor. Scope In: 7:50:38 AM Scope Out: 8:19:56 AM Total Procedure Duration: 0 hours 29 minutes 18 seconds  Findings:      Hemorrhoids were found on perianal exam.      A large amount of stool was found in the rectum, in the sigmoid colon,       in the descending colon and in the transverse colon, interfering with       visualization. Impression:               - Preparation of the colon was poor.                           - Hemorrhoids found on perianal exam.                           - Stool in the rectum, in the sigmoid colon, in the                            descending colon and in the transverse colon.                           - No specimens collected. Moderate Sedation:      Per Anesthesia Care Recommendation:           - Discharge patient to home (ambulatory).                           - Resume previous  diet.                           - Repeat colonoscopy at age 68 for screening                            purposes.                           - Start Linzess 290 mcg qday Procedure Code(s):        --- Professional ---                           [phone], 28, Colonoscopy, flexible; diagnostic,                            including collection of specimen(s) by brushing or                            washing, when performed (separate procedure) Diagnosis Code(s):        --- Professional ---                           K64.9, Unspecified hemorrhoids                           K92.1, Melena (includes Hematochezia) CPT copyright 2019 American Medical Association. All rights reserved. The codes documented in this report are preliminary and upon coder review  may  be revised to meet current compliance requirements. [name], MD [name],  03/31/2022 8:32:44 AM This report has been signed electronically. Number of Addenda: 0

## 2022-03-31 NOTE — Op Note (Signed)
[facility] Patient Name: Jaime [name] Procedure Date: 03/31/2022 7:17 AM MRN: [id number] Date of Birth: 1986-10-05 Attending MD: [name] ,  CSN: [id number] Age: 36 Admit Type: Outpatient Procedure:                Upper GI endoscopy Indications:              Periumbilical abdominal pain Providers:                [name], Charlsie Quest. Theda Sers RN, RN, Casimer Bilis, Technician Referring MD:              Medicines:                Monitored Anesthesia Care Complications:            No immediate complications. Estimated Blood Loss:     Estimated blood loss: none. Procedure:                Pre-Anesthesia Assessment:                           - Prior to the procedure, a History and Physical                            was performed, and patient medications, allergies                            and sensitivities were reviewed. The patient's                            tolerance of previous anesthesia was reviewed.                           - The risks and benefits of the procedure and the                            sedation options and risks were discussed with the                            patient. All questions were answered and informed                            consent was obtained.                           - ASA Grade Assessment: III - A patient with severe                            systemic disease.                           After obtaining informed consent, the endoscope was                            passed under direct vision. Throughout the  procedure, the patient's blood pressure, pulse, and                            oxygen saturations were monitored continuously. The                            [id number]-[id number] ([id number]) scope was introduced through the                            mouth, and advanced to the second part of duodenum.                            The upper GI endoscopy was accomplished without                             difficulty. The patient tolerated the procedure                            well. Scope In: 7:43:17 AM Scope Out: 7:45:30 AM Total Procedure Duration: 0 hours 2 minutes 13 seconds  Findings:      The examined esophagus was normal. No varices were observed upon careful       inspection in forward view      A large amount of food (residue) was found in the gastric body.      A few small sessile polyps with no bleeding were found in the gastric       antrum.      The examined duodenum was normal.      Note: slow gastric emptying likely related to methadone and Ozempic day. Impression:               - Normal esophagus.                           - A large amount of food (residue) in the stomach.                           - A few gastric polyps.                           - Normal examined duodenum.                           - No specimens collected. Moderate Sedation:      Per Anesthesia Care Recommendation:           - Discharge patient to home (ambulatory).                           - Resume previous diet.                           - Eat small meals during the day.                           - Can try edible peppermint oil/IBGard as needed  for abdominal distention. Procedure Code(s):        --- Professional ---                           [phone], Esophagogastroduodenoscopy, flexible,                            transoral; diagnostic, including collection of                            specimen(s) by brushing or washing, when performed                            (separate procedure) Diagnosis Code(s):        --- Professional ---                           K31.7, Polyp of stomach and duodenum                           J12.16, Periumbilical pain CPT copyright 2019 American Medical Association. All rights reserved. The codes documented in this report are preliminary and upon coder review may  be revised to meet current compliance  requirements. [name], MD [name],  03/31/2022 8:28:13 AM This report has been signed electronically. Number of Addenda: 0

## 2022-03-31 NOTE — Progress Notes (Signed)
Pt glucose 40 post procedure, Dr. Briant Cedar aware. 1 amp of D50 given, glucose recheck was 103. Dr. Briant Cedar aware , pt in NAD and discharged home with mother.

## 2022-03-31 NOTE — Anesthesia Preprocedure Evaluation (Signed)
Anesthesia Evaluation  Patient identified by MRN, date of birth, ID band Patient awake    Reviewed: Allergy & Precautions, H&P , NPO status , Patient's Chart, lab work & pertinent test results, reviewed documented beta blocker date and time   Airway Mallampati: II  TM Distance: >3 FB Neck ROM: full    Dental no notable dental hx.    Pulmonary shortness of breath, asthma , COPD, Current Smoker and Patient abstained from smoking.,    Pulmonary exam normal breath sounds clear to auscultation       Cardiovascular Exercise Tolerance: Good negative cardio ROS   Rhythm:regular Rate:Normal     Neuro/Psych  Headaches, Seizures -,  PSYCHIATRIC DISORDERS Anxiety Depression    GI/Hepatic PUD, GERD  Medicated,(+) Hepatitis -, C  Endo/Other  diabetes, Type 2Morbid obesity  Renal/GU negative Renal ROS  negative genitourinary   Musculoskeletal   Abdominal   Peds  Hematology negative hematology ROS (+)   Anesthesia Other Findings   Reproductive/Obstetrics negative OB ROS                             Anesthesia Physical Anesthesia Plan  ASA: 3  Anesthesia Plan: General   Post-op Pain Management:    Induction:   PONV Risk Score and Plan: Propofol infusion  Airway Management Planned:   Additional Equipment:   Intra-op Plan:   Post-operative Plan:   Informed Consent: I have reviewed the patients History and Physical, chart, labs and discussed the procedure including the risks, benefits and alternatives for the proposed anesthesia with the patient or authorized representative who has indicated his/her understanding and acceptance.     Dental Advisory Given  Plan Discussed with: CRNA  Anesthesia Plan Comments:         Anesthesia Quick Evaluation

## 2022-03-31 NOTE — Anesthesia Postprocedure Evaluation (Signed)
Anesthesia Post Note  Patient: Jaime [name]  Procedure(s) Performed: COLONOSCOPY WITH PROPOFOL ESOPHAGOGASTRODUODENOSCOPY (EGD) WITH PROPOFOL  Patient location during evaluation: Phase II Anesthesia Type: General Level of consciousness: awake Pain management: pain level controlled Vital Signs Assessment: post-procedure vital signs reviewed and stable Respiratory status: spontaneous breathing and respiratory function stable Cardiovascular status: blood pressure returned to baseline and stable Postop Assessment: no headache and no apparent nausea or vomiting Anesthetic complications: no Comments: Late entry   No notable events documented.   Last Vitals:  Vitals:   03/31/22 0624 03/31/22 0823  BP: 124/77 127/62  Pulse: (!) 102 (!) 103  Resp: 18 18  Temp: 36.8 C 37.2 C  SpO2: 95% 97%    Last Pain:  Vitals:   03/31/22 0841  TempSrc:   PainSc: [place]

## 2022-03-31 NOTE — Transfer of Care (Signed)
Immediate Anesthesia Transfer of Care Note  Patient: Jaime [name]  Procedure(s) Performed: COLONOSCOPY WITH PROPOFOL ESOPHAGOGASTRODUODENOSCOPY (EGD) WITH PROPOFOL  Patient Location: Short Stay  Anesthesia Type:General  Level of Consciousness: awake and patient cooperative  Airway & Oxygen Therapy: Patient Spontanous Breathing  Post-op Assessment: Report given to RN and Post -op Vital signs reviewed and stable  Post vital signs: Reviewed and stable  Last Vitals:  Vitals Value Taken Time  BP 127/62 03/31/22 0823  Temp 37.2 C 03/31/22 0823  Pulse 103 03/31/22 0823  Resp 18 03/31/22 0823  SpO2 97 % 03/31/22 0823    Last Pain:  Vitals:   03/31/22 0823  TempSrc: Oral  PainSc: 0-No pain      Patients Stated Pain Goal: 8 (20/03/79 4446)  Complications: No notable events documented.

## 2022-03-31 NOTE — Anesthesia Procedure Notes (Signed)
Date/Time: 03/31/2022 7:35 AM Performed by: Vista Deck, CRNA Pre-anesthesia Checklist: Patient identified, Emergency Drugs available, Suction available, Timeout performed and Patient being monitored Patient Re-evaluated:Patient Re-evaluated prior to induction Oxygen Delivery Method: Nasal Cannula

## 2022-04-02 ENCOUNTER — Encounter (INDEPENDENT_AMBULATORY_CARE_PROVIDER_SITE_OTHER): Payer: Self-pay | Admitting: *Deleted

## 2022-04-06 ENCOUNTER — Encounter (HOSPITAL_COMMUNITY): Payer: Self-pay | Admitting: Gastroenterology

## 2022-04-06 ENCOUNTER — Other Ambulatory Visit: Payer: Self-pay | Admitting: Nurse Practitioner

## 2022-05-07 ENCOUNTER — Other Ambulatory Visit: Payer: Self-pay | Admitting: Nurse Practitioner

## 2022-05-18 ENCOUNTER — Encounter (INDEPENDENT_AMBULATORY_CARE_PROVIDER_SITE_OTHER): Payer: Self-pay | Admitting: Gastroenterology

## 2022-05-18 ENCOUNTER — Telehealth (INDEPENDENT_AMBULATORY_CARE_PROVIDER_SITE_OTHER): Payer: Self-pay

## 2022-05-18 ENCOUNTER — Encounter (INDEPENDENT_AMBULATORY_CARE_PROVIDER_SITE_OTHER): Payer: Self-pay

## 2022-05-18 ENCOUNTER — Ambulatory Visit (INDEPENDENT_AMBULATORY_CARE_PROVIDER_SITE_OTHER): Payer: Medicaid Other | Admitting: Gastroenterology

## 2022-05-18 ENCOUNTER — Other Ambulatory Visit (INDEPENDENT_AMBULATORY_CARE_PROVIDER_SITE_OTHER): Payer: Self-pay

## 2022-05-18 VITALS — BP 111/73 | HR 102 | Temp 98.7°F | Ht 70.0 in | Wt 293.7 lb

## 2022-05-18 DIAGNOSIS — K581 Irritable bowel syndrome with constipation: Secondary | ICD-10-CM

## 2022-05-18 DIAGNOSIS — Z79891 Long term (current) use of opiate analgesic: Secondary | ICD-10-CM | POA: Diagnosis not present

## 2022-05-18 DIAGNOSIS — K7460 Unspecified cirrhosis of liver: Secondary | ICD-10-CM

## 2022-05-18 DIAGNOSIS — K5900 Constipation, unspecified: Secondary | ICD-10-CM | POA: Insufficient documentation

## 2022-05-18 DIAGNOSIS — K5903 Drug induced constipation: Secondary | ICD-10-CM

## 2022-05-18 DIAGNOSIS — F1120 Opioid dependence, uncomplicated: Secondary | ICD-10-CM

## 2022-05-18 DIAGNOSIS — R112 Nausea with vomiting, unspecified: Secondary | ICD-10-CM

## 2022-05-18 DIAGNOSIS — K625 Hemorrhage of anus and rectum: Secondary | ICD-10-CM

## 2022-05-18 MED ORDER — PEG 3350-KCL-NA BICARB-NACL 420 G PO SOLR: 4000.0000 mL | ORAL | 0 refills | Status: DC

## 2022-05-18 MED ORDER — NALOXEGOL OXALATE 25 MG PO TABS: 25.0000 mg | ORAL_TABLET | Freq: Every day | ORAL | 3 refills | Status: DC

## 2022-05-18 NOTE — Progress Notes (Signed)
[name], M.D. Gastroenterology & Hepatology [facility] [address] [place], [place] [address]  Primary Care Physician: Neale Burly, MD [place] [address]  I will communicate my assessment and recommendations to the referring MD via EMR.  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  The patient was last seen on 02/16/2022. At that time, the patient was scheduled for a colonoscopy which had a poor prep and the procedure was aborted after the scope was advanced up to the transverse colon.  She was advised to increase the use of MiraLAX and to take Phenergan as needed for nausea.  She is currently taking 2 capfuls of Miralax every day. Also takes Linzess 290 mcg qday. Stool is not hard. She reports that every time she moves her bowels she sees blood in her stool which has been concerning for her. She states "she wants to see a surgeon to take care of the hemorrhoids even if it is a painful procedure".  Denies having any rectal pain or discomfort in the rectum.  She presents persistent episodes of pain in her abdomen diffusely which has been present for months and is similar to the pain she has endorsed during previous encounters. She reports having some nausea and occasional vomiting. Takes Phenergan, which helps sometimes only takes it as needed.  The patient denies having any nausea, vomiting, fever, chills,  melena, hematemesis, diarrhea, jaundice, pruritus .  Weight has increased by 6 lb. She reports having some dark "coffee" stools.  States her legs and ankles occasionally swell, but is not completely controlled with furosemide and spironolactone.  Cirrhosis related questions: Hematemesis/coffee ground emesis: No History of variceal bleeding: No Abdominal pain: Yes Abdominal distention/worsening ascitesNo Fever/chills: No Episodes of confusion/disorientation: Yes, this has been present since she had her hospitalization for diabetic coma in the past Number of daily bowel movements:every other day Taking diuretics?:  Yes, furosemide 40 mg every day, spironolactone 100 mg every day Prior history of banding?: No Prior episodes of SBP: No Last time liver imaging was performed: 08/07/2021-no presence of mass in RUQ ultrasound Notably, she had a CT of the abdomen pelvis without IV contrast on 11/08/2021 which did not show any acute abnormalities explaining her episodes of pain. Patient had an ultrasound ordered in her last appointment but she did not perform this test MELD score: 11/24/2021 - 11  Last EGD: 11/25/2021 No endoscopic abnormality was evident in the esophagus to explain the patient's complaint of dysphagia. Upon careful inspection, no varices were observed. Mild portal hypertensive gastropathy was found in the entire examined stomach. Upon careful inspection, no gastric varices were observed. Five non-bleeding cratered gastric ulcers with a clean ulcer base (Forrest Class III) were found in the gastric antrum. The largest lesion was 6 mm in largest dimension. Biopsies from body and antrum were taken with a cold forceps for Helicobacter pylori testing. The examined duodenum was normal. Biopsies were taken with a cold forceps for histology. Dysphagia likely related to methadone use   Path:  A. SMALL BOWEL, BIOPSY:  Benign duodenal mucosa with no diagnostic abnormality   B. STOMACH, BIOPSY:  Reactive gastropathy with foveolar hyperplasia  and changes compatible  with healed erosion/ulceration  Negative for H. pylori, intestinal metaplasia, dysplasia and carcinoma   Last Colonoscopy: 03/31/2022 - Preparation of the colon was poor. - Hemorrhoids found on perianal exam. - Stool in the rectum, in the sigmoid colon, in the descending colon and in the transverse colon.  Past Medical History: Past Medical History:  Diagnosis Date   Anxiety    Asthma    Chronic abdominal pain    Chronic back pain    Cirrhosis ([place])    Cirrhosis ([place])    COPD (chronic obstructive pulmonary disease) ([place])    Depression    Diabetes mellitus without complication ([place])    Diabetes mellitus, type II ([place])    GERD (gastroesophageal reflux disease)    Hepatitis C    Hyperlipidemia    Insomnia    Long-term current use of methadone for opiate dependence ([place])    Lupus ([place])    Migraine headache    Neuropathy    Nocturnal seizures ([place])    Peptic ulcer    Spleen enlarged     Past Surgical History: Past Surgical History:  Procedure Laterality Date   BIOPSY  11/25/2021   Procedure: BIOPSY;  Surgeon: Harvel Quale, MD;  [facility];  Service: Gastroenterology;;   CHOLECYSTECTOMY     COLONOSCOPY WITH PROPOFOL N/A 11/25/2021   Procedure: COLONOSCOPY WITH PROPOFOL;  Surgeon: Harvel Quale, MD;  [facility];  Service: Gastroenterology;  Laterality: N/A;  805   COLONOSCOPY WITH PROPOFOL N/A 03/31/2022   Procedure: COLONOSCOPY WITH PROPOFOL;  Surgeon: Harvel Quale, MD;  [facility];  Service: Gastroenterology;  Laterality: N/A;  730   ESOPHAGOGASTRODUODENOSCOPY  06/2020   done at [facility], candida in upper esophagus (treated with diflucan), ulcerative esophagitis at GE junction, gastritis in stomach, single ulcer in duodenal bulb, with duodenal mucosa showing no abnormality. No presence of varices   ESOPHAGOGASTRODUODENOSCOPY (EGD) WITH PROPOFOL N/A 11/25/2021   Procedure:  ESOPHAGOGASTRODUODENOSCOPY (EGD) WITH PROPOFOL;  Surgeon: Harvel Quale, MD;  [facility];  Service: Gastroenterology;  Laterality: N/A;   ESOPHAGOGASTRODUODENOSCOPY (EGD) WITH PROPOFOL N/A 03/31/2022   Procedure: ESOPHAGOGASTRODUODENOSCOPY (EGD) WITH PROPOFOL;  Surgeon: Harvel Quale, MD;  [facility];  Service: Gastroenterology;  Laterality: N/A;    Family History: Family History  Problem Relation Age of Onset   Hypertension Mother    Hyperlipidemia Mother    Hyperlipidemia Maternal Grandfather     Social History: Social History   Tobacco Use  Smoking Status Every Day   Types: E-cigarettes  Smokeless Tobacco Never   Social History   Substance and Sexual Activity  Alcohol Use No   Social History   Substance and Sexual Activity  Drug Use No    Allergies: Allergies  Allergen Reactions   Iodine-131 Anaphylaxis, Shortness Of Breath and Swelling   Ivp Dye [Iodinated Contrast Media] Anaphylaxis    Skin gets very red, unable to walk    Ketorolac Tromethamine Shortness Of Breath   Tylenol [Acetaminophen] Other (See Comments)    Due to cirrhosis    Vancomycin Swelling    Facial swelling,    Gabapentin Itching   Nsaids Other (See Comments)    Flares ulcers   Suboxone [Buprenorphine Hcl-Naloxone Hcl]     Withdrawal symptoms     Medications: Current Outpatient  Medications  Medication Sig Dispense Refill   Accu-Chek Softclix Lancets lancets USE TO TEST BLOOD SUGAR 4 TIMES A DAY. 100 each 0   albuterol (ACCUNEB) 0.63 MG/3ML nebulizer solution Take 1 ampule by nebulization every 6 (six) hours as needed for wheezing.     ARIPiprazole (ABILIFY) 20 MG tablet Take 20 mg by mouth daily.     aspirin EC 81 MG tablet Take 81 mg by mouth daily. Swallow whole.     blood glucose meter kit and supplies Dispense based on patient and insurance preference. Use up to four times daily as directed. (FOR ICD-10 E10.9, E11.9). 1 each 0    cetirizine (ZYRTEC ALLERGY) 10 MG tablet Take 1 tablet (10 mg total) by mouth daily. 30 tablet 0   clonazePAM (KLONOPIN) 0.5 MG tablet Take 0.5 mg by mouth 2 (two) times daily.     Continuous Blood Gluc Sensor (DEXCOM G6 SENSOR) MISC APPLY 1 SENSOR EVERY 10 DAYS 3 each 0   Continuous Blood Gluc Transmit (DEXCOM G6 TRANSMITTER) MISC USE AS DIRECTED. 1 each 1   cromolyn (OPTICROM) 4 % ophthalmic solution Place 1 drop into both eyes 2 (two) times daily.     etonogestrel (NEXPLANON) 68 MG IMPL implant 1 each by Subdermal route once.     furosemide (LASIX) 40 MG tablet Take 40 mg by mouth daily.     glipiZIDE (GLUCOTROL XL) 5 MG 24 hr tablet Take 1 tablet (5 mg total) by mouth daily with breakfast. 90 tablet 1   glucose blood (ACCU-CHEK GUIDE) test strip Use as instructed to check blood glucose four times daily 150 each 5   hyoscyamine (ANASPAZ) 0.125 MG TBDP disintergrating tablet TAKE 1 TABLET UNDER THE TONGUE THREE TIMES A DAY AS NEEDED FOR BLADDER SPASMS OR CRAMPING. 90 tablet 0   Insulin Pen Needle (GLOBAL EASE INJECT PEN NEEDLES) 31G X 8 MM MISC Use 1 pen needle 4 times daily for insulin. 400 each 0   Insulin Pen Needle (PEN NEEDLES) 32G X 6 MM MISC 1 each by Does not apply route 3 (three) times daily. 100 each 3   insulin regular human CONCENTRATED (HUMULIN R U-500 KWIKPEN) 500 UNIT/ML KwikPen Inject 70-80 Units into the skin 3 (three) times daily with meals. Inject 80 units with breakfast and lunch and 70 with supper if glucose is above 90 and eating 60 mL 2   linaclotide (LINZESS) 290 MCG CAPS capsule Take 1 capsule (290 mcg total) by mouth daily before breakfast. 90 capsule 3   lisinopril (ZESTRIL) 5 MG tablet TAKE 1 TABLET ONCE DAILY. 90 tablet 0   metFORMIN (GLUCOPHAGE) 1000 MG tablet TAKE 1 TABLET TWICE DAILY WITH A MEAL. 60 tablet 0   methadone (DOLOPHINE) 10 MG/5ML solution Take 60 mg by mouth every morning.     Multiple Vitamin (MULTIVITAMIN) tablet Take 1 tablet by mouth daily.      nystatin (MYCOSTATIN/NYSTOP) powder Apply 1 application topically 3 (three) times daily. (Patient taking differently: Apply 1 application  topically 3 (three) times daily as needed (irritation).) 15 g 0   omeprazole (PRILOSEC) 40 MG capsule TAKE 1 CAPSULE BY MOUTH TWICE DAILY. 60 capsule 0   OZEMPIC, 1 MG/DOSE, 4 MG/3ML SOPN INJECT 1MG AS DIRECTED ONCE A WEEK. 3 mL 2   potassium chloride (KLOR-CON M) 10 MEQ tablet Take 10 mEq by mouth 2 (two) times daily.     pregabalin (LYRICA) 75 MG capsule Take 75 mg by mouth in the morning, at noon, in the evening, and  at bedtime.      PROAIR HFA 108 (90 Base) MCG/ACT inhaler Inhale 1-2 puffs into the lungs every 6 (six) hours as needed for wheezing or shortness of breath.     promethazine (PHENERGAN) 12.5 MG tablet Take 1 tablet (12.5 mg total) by mouth every 8 (eight) hours as needed for nausea or vomiting. 30 tablet 2   propranolol (INDERAL) 10 MG tablet Take 10 mg by mouth 3 (three) times daily.     RESTASIS 0.05 % ophthalmic emulsion Place 1 drop into both eyes as needed (dry eye).      rosuvastatin (CRESTOR) 5 MG tablet Take 1 tablet (5 mg total) by mouth daily. 90 tablet 3   spironolactone (ALDACTONE) 100 MG tablet Take 100 mg by mouth daily.      sucralfate (CARAFATE) 1 GM/10ML suspension Take 10 mLs (1 g total) by mouth 4 (four) times daily -  with meals and at bedtime. 420 mL 1   triamcinolone (KENALOG) 0.1 % Apply 1 application topically daily as needed (irritation).     venlafaxine XR (EFFEXOR-XR) 150 MG 24 hr capsule Take total of 225 mg daily (150 mg + 75 mg ) 90 capsule 0   venlafaxine XR (EFFEXOR-XR) 75 MG 24 hr capsule To take along with 164m for a total of 225 mg daily 90 capsule 0   Vitamin D, Ergocalciferol, (DRISDOL) 1.25 MG (50000 UNIT) CAPS capsule Take 1 capsule (50,000 Units total) by mouth every 7 (seven) days. (Patient taking differently: Take 50,000 Units by mouth every Tuesday.) 12 capsule 0   No current facility-administered  medications for this visit.    Review of Systems: GENERAL: negative for malaise, night sweats HEENT: No changes in hearing or vision, no nose bleeds or other nasal problems. NECK: Negative for lumps, goiter, pain and significant neck swelling RESPIRATORY: Negative for cough, wheezing CARDIOVASCULAR: Negative for chest pain, leg swelling, palpitations, orthopnea GI: SEE HPI MUSCULOSKELETAL: Negative for joint pain or swelling, back pain, and muscle pain. SKIN: Negative for lesions, rash PSYCH: Negative for sleep disturbance, mood disorder and recent psychosocial stressors. HEMATOLOGY Negative for prolonged bleeding, bruising easily, and swollen nodes. ENDOCRINE: Negative for cold or heat intolerance, polyuria, polydipsia and goiter. NEURO: negative for tremor, gait imbalance, syncope and seizures. The remainder of the review of systems is noncontributory.   Physical Exam: BP 111/73 (BP Location: Left Arm, Patient Position: Sitting, Cuff Size: Large)   Pulse (!) 102   Temp 98.7 F (37.1 C) (Oral)   Ht 5' 10" (1.778 m)   Wt 293 lb 11.2 oz (133.2 kg)   BMI 42.14 kg/m  GENERAL: The patient is AO x3, in no acute distress.  Obese HEENT: Head is normocephalic and atraumatic. EOMI are intact. Mouth is well hydrated and without lesions. NECK: Supple. No masses LUNGS: Clear to auscultation. No presence of rhonchi/wheezing/rales. Adequate chest expansion HEART: RRR, normal s1 and s2. ABDOMEN: Diffusely tender upon palpation of the abdomen but no guarding, no peritoneal signs, and nondistended. BS +. No masses. EXTREMITIES: Without any cyanosis, clubbing, rash, lesions. Has +1 pitting edema in both lower extremities. NEUROLOGIC: AOx3, no focal motor deficit. SKIN: no jaundice, no rashes  Imaging/Labs: as above  I personally reviewed and interpreted the available labs, imaging and endoscopic files.  Impression and Plan: Jaime Mackieis a 36y.o. 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  The patient has no presented any decompensating events but is presenting significant third spacing lower extremities.  She has remained with a intermediate MELD score.  At this time we will repeat MELD labs but will also proceed with [facility] screening with ultrasound of her liver.  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  We will attempt for the third time to evaluate her rectal bleeding with a colonoscopy and she will be started on Movantik 25 mg every day (will stop MiraLAX for now) as well as continuing the Linzess to 90 mcg every day.  Ultimately, if the rectal bleeding episodes are related to hemorrhoidal bleeding, we will try conservative measurements but the patient may want to discuss this further with surgeon.  - Schedule colonoscopy - will need to be on a 7 day liquid diet prior to procedure and a 2 day bowel prep - Stop Miralax and start Movantik 25 mg qday  - Continue Linzess 290 mcg qday - Continue Phenergan as needed for nausea - MELD labs and CBC - Schedule liver US - Continue furosemide 40 mg qday and spironolactone 100 mg qday - Reduce salt intake to <2 g per day - Can take Tylenol max of 2 g per day (650 mg q8h) for pain - Avoid NSAIDs for pain - Avoid eating raw oysters/shellfish - Protein shake (Ensure or Boost) every night before going to sleep - RTC 6 months  All questions were answered.      Harvel Quale, MD Gastroenterology and Hepatology [facility]

## 2022-05-18 NOTE — Telephone Encounter (Signed)
Sheyanne Munley Ann Lonie Newsham, CMA  ?

## 2022-05-18 NOTE — Patient Instructions (Addendum)
Schedule colonoscopy - will need to be on a 7 day liquid diet prior to procedure and a 2 day bowel prep Stop Miralax and start Movantik 25 mg qday Continue Linzess 290 mcg qday Continue Phenergan as needed for nausea Perform blood workup Schedule liver US Continue furosemide 40 mg qday and spironolactone 100 mg qday - Reduce salt intake to <2 g per day - Can take Tylenol max of 2 g per day (650 mg q8h) for pain - Avoid NSAIDs for pain - Avoid eating raw oysters/shellfish - Protein shake (Ensure or Boost) every night before going to sleep

## 2022-05-19 ENCOUNTER — Encounter: Payer: Self-pay | Admitting: Nurse Practitioner

## 2022-05-19 ENCOUNTER — Ambulatory Visit (INDEPENDENT_AMBULATORY_CARE_PROVIDER_SITE_OTHER): Payer: Medicaid Other | Admitting: Nurse Practitioner

## 2022-05-19 VITALS — BP 110/74 | HR 90 | Ht 70.0 in | Wt 292.0 lb

## 2022-05-19 DIAGNOSIS — E1165 Type 2 diabetes mellitus with hyperglycemia: Secondary | ICD-10-CM

## 2022-05-19 DIAGNOSIS — Z91119 Patient's noncompliance with dietary regimen due to unspecified reason: Secondary | ICD-10-CM

## 2022-05-19 DIAGNOSIS — E782 Mixed hyperlipidemia: Secondary | ICD-10-CM | POA: Diagnosis not present

## 2022-05-19 LAB — POCT GLYCOSYLATED HEMOGLOBIN (HGB A1C): HbA1c POC (<> result, manual entry): 10.5 % (ref 4.0–5.6)

## 2022-05-19 LAB — CBC WITH DIFFERENTIAL/PLATELET
Absolute Monocytes: 304 cells/uL (ref 200–950)
Basophils Absolute: 20 cells/uL (ref 0–200)
Basophils Relative: 0.5 %
Eosinophils Absolute: 222 cells/uL (ref 15–500)
Eosinophils Relative: 5.7 %
HCT: 42.7 % (ref 35.0–45.0)
Hemoglobin: 13.8 g/dL (ref 11.7–15.5)
Lymphs Abs: 585 cells/uL — ABNORMAL LOW (ref 850–3900)
MCH: 31.3 pg (ref 27.0–33.0)
MCHC: 32.3 g/dL (ref 32.0–36.0)
MCV: 96.8 fL (ref 80.0–100.0)
MPV: 11.2 fL (ref 7.5–12.5)
Monocytes Relative: 7.8 %
Neutro Abs: 2769 cells/uL (ref 1500–7800)
Neutrophils Relative %: 71 %
Platelets: 53 10*3/uL — ABNORMAL LOW (ref 140–400)
RBC: 4.41 10*6/uL (ref 3.80–5.10)
RDW: 14.2 % (ref 11.0–15.0)
Total Lymphocyte: 15 %
WBC: 3.9 10*3/uL (ref 3.8–10.8)

## 2022-05-19 LAB — COMPREHENSIVE METABOLIC PANEL
AG Ratio: 1.1 (calc) (ref 1.0–2.5)
ALT: 33 U/L — ABNORMAL HIGH (ref 6–29)
AST: 35 U/L — ABNORMAL HIGH (ref 10–30)
Albumin: 3.4 g/dL — ABNORMAL LOW (ref 3.6–5.1)
Alkaline phosphatase (APISO): 144 U/L — ABNORMAL HIGH (ref 31–125)
BUN: 7 mg/dL (ref 7–25)
CO2: 22 mmol/L (ref 20–32)
Calcium: 8.4 mg/dL — ABNORMAL LOW (ref 8.6–10.2)
Chloride: 101 mmol/L (ref 98–110)
Creat: 0.66 mg/dL (ref 0.50–0.97)
Globulin: 3.2 g/dL (calc) (ref 1.9–3.7)
Glucose, Bld: 413 mg/dL — ABNORMAL HIGH (ref 65–99)
Potassium: 3.9 mmol/L (ref 3.5–5.3)
Sodium: 136 mmol/L (ref 135–146)
Total Bilirubin: 1.1 mg/dL (ref 0.2–1.2)
Total Protein: 6.6 g/dL (ref 6.1–8.1)

## 2022-05-19 LAB — PROTIME-INR
INR: 1.1
Prothrombin Time: 11.6 s — ABNORMAL HIGH (ref 9.0–11.5)

## 2022-05-19 MED ORDER — METFORMIN HCL 1000 MG PO TABS: 1000.0000 mg | ORAL_TABLET | Freq: Two times a day (BID) | ORAL | 3 refills | Status: DC

## 2022-05-19 MED ORDER — SEMAGLUTIDE (2 MG/DOSE) 8 MG/3ML ~~LOC~~ SOPN: 2.0000 mg | PEN_INJECTOR | SUBCUTANEOUS | 3 refills | Status: DC

## 2022-05-19 MED ORDER — HUMULIN R U-500 KWIKPEN 500 UNIT/ML ~~LOC~~ SOPN: 80.0000 [IU] | PEN_INJECTOR | Freq: Three times a day (TID) | SUBCUTANEOUS | 2 refills | Status: DC

## 2022-05-19 MED ORDER — GLIPIZIDE ER 5 MG PO TB24: 5.0000 mg | ORAL_TABLET | Freq: Every day | ORAL | 1 refills | Status: DC

## 2022-05-19 NOTE — Progress Notes (Signed)
05/19/2022, 11:09 AM     Endocrinology Follow Up Visit  Subjective:    Patient ID: Jaime [name], female    DOB: 03/10/1986.  Desere Gwin is being seen in follow up for management of currently uncontrolled symptomatic diabetes requested by  Neale Burly, MD.   Past Medical History:  Diagnosis Date   Anxiety    Asthma    Chronic abdominal pain    Chronic back pain    Cirrhosis ([place])    Cirrhosis ([place])    COPD (chronic obstructive pulmonary disease) ([place])    Depression    Diabetes mellitus without complication ([place])    Diabetes mellitus, type II ([place])    GERD (gastroesophageal reflux disease)    Hepatitis C    Hyperlipidemia    Insomnia    Long-term current use of methadone for opiate dependence ([place])    Lupus ([place])    Migraine headache    Neuropathy    Nocturnal seizures ([place])    Peptic ulcer    Spleen enlarged     Past Surgical History:  Procedure Laterality Date   BIOPSY  11/25/2021   Procedure: BIOPSY;  Surgeon: Harvel Quale, MD;  [facility];  Service: Gastroenterology;;   CHOLECYSTECTOMY     COLONOSCOPY WITH PROPOFOL N/A 11/25/2021   Procedure: COLONOSCOPY WITH PROPOFOL;  Surgeon: Harvel Quale, MD;  [facility];  Service: Gastroenterology;  Laterality: N/A;  805   COLONOSCOPY WITH PROPOFOL N/A 03/31/2022   Procedure: COLONOSCOPY WITH PROPOFOL;  Surgeon: Harvel Quale, MD;  [facility];  Service: Gastroenterology;  Laterality: N/A;  730   ESOPHAGOGASTRODUODENOSCOPY  06/2020   done at [facility], candida in upper esophagus (treated with diflucan), ulcerative esophagitis at GE junction, gastritis in stomach, single ulcer in duodenal bulb, with duodenal mucosa showing no abnormality. No presence of varices   ESOPHAGOGASTRODUODENOSCOPY (EGD) WITH PROPOFOL N/A 11/25/2021   Procedure: ESOPHAGOGASTRODUODENOSCOPY (EGD)  WITH PROPOFOL;  Surgeon: Harvel Quale, MD;  [facility];  Service: Gastroenterology;  Laterality: N/A;   ESOPHAGOGASTRODUODENOSCOPY (EGD) WITH PROPOFOL N/A 03/31/2022   Procedure: ESOPHAGOGASTRODUODENOSCOPY (EGD) WITH PROPOFOL;  Surgeon: Harvel Quale, MD;  [facility];  Service: Gastroenterology;  Laterality: N/A;    Social History   Socioeconomic History   Marital status: Single    Spouse name: Not on file   Number of children: Not on file   Years of education: Not on file   Highest education level: Not on file  Occupational History   Not on file  Tobacco Use   Smoking status: Every Day    Types: E-cigarettes   Smokeless tobacco: Never  Vaping Use   Vaping Use: Every day   Substances: Nicotine, Flavoring  Substance and Sexual Activity   Alcohol use: No   Drug use: No   Sexual activity: Not Currently    Birth control/protection: Implant  Other Topics Concern   Not on file  Social History Narrative   Not on file   Social Determinants of Health   Financial Resource Strain: Medium Risk (05/09/2020)   Overall Financial Resource  Strain (CARDIA)    Difficulty of Paying Living Expenses: Somewhat hard  Food Insecurity: No Food Insecurity (05/09/2020)   Hunger Vital Sign    Worried About Running Out of Food in the Last Year: Never true    Ran Out of Food in the Last Year: Never true  Transportation Needs: No Transportation Needs (05/09/2020)   PRAPARE - Hydrologist (Medical): No    Lack of Transportation (Non-Medical): No  Physical Activity: Insufficiently Active (05/09/2020)   Exercise Vital Sign    Days of Exercise per Week: 1 day    Minutes of Exercise per Session: 20 min  Stress: Stress Concern Present (05/09/2020)   [place]    Feeling of Stress : Very much  Social Connections: Moderately Isolated (05/09/2020)   Social Connection and  Isolation Panel [NHANES]    Frequency of Communication with Friends and Family: More than three times a week    Frequency of Social Gatherings with Friends and Family: Twice a week    Attends Religious Services: More than 4 times per year    Active Member of [employer] or Organizations: No    Attends Archivist Meetings: Never    Marital Status: Never married    Family History  Problem Relation Age of Onset   Hypertension Mother    Hyperlipidemia Mother    Hyperlipidemia Maternal Grandfather     Outpatient Encounter Medications as of 05/19/2022  Medication Sig   Semaglutide, 2 MG/DOSE, 8 MG/3ML SOPN Inject 2 mg as directed once a week.   Accu-Chek Softclix Lancets lancets USE TO TEST BLOOD SUGAR 4 TIMES A DAY.   albuterol (ACCUNEB) 0.63 MG/3ML nebulizer solution Take 1 ampule by nebulization every 6 (six) hours as needed for wheezing.   ARIPiprazole (ABILIFY) 20 MG tablet Take 20 mg by mouth daily.   aspirin EC 81 MG tablet Take 81 mg by mouth daily. Swallow whole.   blood glucose meter kit and supplies Dispense based on patient and insurance preference. Use up to four times daily as directed. (FOR ICD-10 E10.9, E11.9).   cetirizine (ZYRTEC ALLERGY) 10 MG tablet Take 1 tablet (10 mg total) by mouth daily.   clonazePAM (KLONOPIN) 0.5 MG tablet Take 0.5 mg by mouth 2 (two) times daily.   Continuous Blood Gluc Sensor (DEXCOM G6 SENSOR) MISC APPLY 1 SENSOR EVERY 10 DAYS   Continuous Blood Gluc Transmit (DEXCOM G6 TRANSMITTER) MISC USE AS DIRECTED.   cromolyn (OPTICROM) 4 % ophthalmic solution Place 1 drop into both eyes 2 (two) times daily.   etonogestrel (NEXPLANON) 68 MG IMPL implant 1 each by Subdermal route once.   furosemide (LASIX) 40 MG tablet Take 40 mg by mouth daily.   glipiZIDE (GLUCOTROL XL) 5 MG 24 hr tablet Take 1 tablet (5 mg total) by mouth daily with breakfast.   glucose blood (ACCU-CHEK GUIDE) test strip Use as instructed to check blood glucose four times daily    hyoscyamine (ANASPAZ) 0.125 MG TBDP disintergrating tablet TAKE 1 TABLET UNDER THE TONGUE THREE TIMES A DAY AS NEEDED FOR BLADDER SPASMS OR CRAMPING.   Insulin Pen Needle (GLOBAL EASE INJECT PEN NEEDLES) 31G X 8 MM MISC Use 1 pen needle 4 times daily for insulin.   Insulin Pen Needle (PEN NEEDLES) 32G X 6 MM MISC 1 each by Does not apply route 3 (three) times daily.   insulin regular human CONCENTRATED (HUMULIN R U-500 KWIKPEN) 500 UNIT/ML KwikPen Inject  80-90 Units into the skin 3 (three) times daily with meals. Inject 80 units with breakfast and lunch and 70 with supper if glucose is above 90 and eating   linaclotide (LINZESS) 290 MCG CAPS capsule Take 1 capsule (290 mcg total) by mouth daily before breakfast.   lisinopril (ZESTRIL) 5 MG tablet TAKE 1 TABLET ONCE DAILY.   metFORMIN (GLUCOPHAGE) 1000 MG tablet Take 1 tablet (1,000 mg total) by mouth 2 (two) times daily with a meal.   methadone (DOLOPHINE) 10 MG/5ML solution Take 60 mg by mouth every morning.   methocarbamol (ROBAXIN) 500 MG tablet Take by mouth at bedtime.   Multiple Vitamin (MULTIVITAMIN) tablet Take 1 tablet by mouth daily.   naloxegol oxalate (MOVANTIK) 25 MG TABS tablet Take 1 tablet (25 mg total) by mouth daily.   nystatin (MYCOSTATIN/NYSTOP) powder Apply 1 application topically 3 (three) times daily. (Patient taking differently: Apply 1 application  topically 3 (three) times daily as needed (irritation).)   omeprazole (PRILOSEC) 40 MG capsule TAKE 1 CAPSULE BY MOUTH TWICE DAILY.   polyethylene glycol-electrolytes (TRILYTE) 420 g solution Take 4,000 mLs by mouth as directed.   potassium chloride (KLOR-CON M) 10 MEQ tablet Take 10 mEq by mouth 2 (two) times daily.   pregabalin (LYRICA) 75 MG capsule Take 75 mg by mouth in the morning, at noon, in the evening, and at bedtime.    PROAIR HFA 108 (90 Base) MCG/ACT inhaler Inhale 1-2 puffs into the lungs every 6 (six) hours as needed for wheezing or shortness of breath.    promethazine (PHENERGAN) 12.5 MG tablet Take 1 tablet (12.5 mg total) by mouth every 8 (eight) hours as needed for nausea or vomiting.   propranolol (INDERAL) 10 MG tablet Take 10 mg by mouth 3 (three) times daily.   RESTASIS 0.05 % ophthalmic emulsion Place 1 drop into both eyes as needed (dry eye).    rosuvastatin (CRESTOR) 5 MG tablet Take 1 tablet (5 mg total) by mouth daily.   spironolactone (ALDACTONE) 100 MG tablet Take 100 mg by mouth daily.    sucralfate (CARAFATE) 1 GM/10ML suspension Take 10 mLs (1 g total) by mouth 4 (four) times daily -  with meals and at bedtime.   triamcinolone (KENALOG) 0.1 % Apply 1 application topically daily as needed (irritation).   venlafaxine XR (EFFEXOR-XR) 150 MG 24 hr capsule Take total of 225 mg daily (150 mg + 75 mg )   venlafaxine XR (EFFEXOR-XR) 75 MG 24 hr capsule To take along with 131m for a total of 225 mg daily   Vitamin D, Ergocalciferol, (DRISDOL) 1.25 MG (50000 UNIT) CAPS capsule Take 1 capsule (50,000 Units total) by mouth every 7 (seven) days. (Patient taking differently: Take 50,000 Units by mouth every Tuesday.)   [DISCONTINUED] glipiZIDE (GLUCOTROL XL) 5 MG 24 hr tablet Take 1 tablet (5 mg total) by mouth daily with breakfast.   [DISCONTINUED] insulin regular human CONCENTRATED (HUMULIN R U-500 KWIKPEN) 500 UNIT/ML KwikPen Inject 70-80 Units into the skin 3 (three) times daily with meals. Inject 80 units with breakfast and lunch and 70 with supper if glucose is above 90 and eating   [DISCONTINUED] metFORMIN (GLUCOPHAGE) 1000 MG tablet TAKE 1 TABLET TWICE DAILY WITH A MEAL.   [DISCONTINUED] OZEMPIC, 1 MG/DOSE, 4 MG/3ML SOPN INJECT 1MG AS DIRECTED ONCE A WEEK.   No facility-administered encounter medications on file as of 05/19/2022.    ALLERGIES: Allergies  Allergen Reactions   Iodine-131 Anaphylaxis, Shortness Of Breath and Swelling  Ivp Dye [Iodinated Contrast Media] Anaphylaxis    Skin gets very red, unable to walk    Ketorolac  Tromethamine Shortness Of Breath   Tylenol [Acetaminophen] Other (See Comments)    Due to cirrhosis    Vancomycin Swelling    Facial swelling,    Gabapentin Itching   Nsaids Other (See Comments)    Flares ulcers   Suboxone [Buprenorphine Hcl-Naloxone Hcl]     Withdrawal symptoms     VACCINATION STATUS: Immunization History  Administered Date(s) Administered   Influenza,inj,Quad PF,6+ Mos 07/18/2016   Moderna Sars-Covid-2 Vaccination 03/07/2020, 04/04/2020   Pneumococcal Polysaccharide-23 07/18/2016, 08/01/2020    Diabetes She presents for her follow-up diabetic visit. She has type 2 diabetes mellitus. Onset time: She was diagnosed at approximate age of 10 years. Her disease course has been worsening. There are no hypoglycemic associated symptoms. Pertinent negatives for hypoglycemia include no headaches or pallor. (Had symptoms of hypoglycemia when glucose dropped to 117.) Associated symptoms include fatigue, foot paresthesias, polydipsia and polyuria. Pertinent negatives for diabetes include no blurred vision, no chest pain, no polyphagia and no weight loss. There are no hypoglycemic complications. (History of unresponsiveness requiring EMS and hospitalization- none recent) Symptoms are worsening. Diabetic complications include heart disease and peripheral neuropathy. Risk factors for coronary artery disease include diabetes mellitus, obesity, sedentary lifestyle, dyslipidemia and hypertension. Current diabetic treatment includes oral agent (dual therapy) and intensive insulin program (and Ozempic). She is compliant with treatment most of the time. Her weight is increasing rapidly. She is following a low salt and generally unhealthy diet. When asked about meal planning, she reported none. She has had a previous visit with a dietitian. She participates in exercise intermittently. Her home blood glucose trend is increasing rapidly. Her overall blood glucose range is >200 mg/dl. (She present  today, accompanied by her mother, with her CGM showing significant hyperglycemia.  Her POCT A1c today is 10.5%, increasing from last visit of 8.4%.  Mom notes Alexis has been eating more sweets lately, and they have not been working out as much due to the heat.  Analysis of her CGM shows TIR 0%, TAR (in very high range) 100%, TBR 0%.) An ACE inhibitor/angiotensin II receptor blocker is being taken. She does not see a podiatrist.Eye exam is not current.  Hypertension This is a chronic problem. The current episode started more than 1 month ago. The problem has been resolved since onset. The problem is controlled. Associated symptoms include peripheral edema. Pertinent negatives include no blurred vision, chest pain, headaches, palpitations or shortness of breath. There are no associated agents to hypertension. Risk factors for coronary artery disease include diabetes mellitus, dyslipidemia, obesity and sedentary lifestyle. Past treatments include diuretics and beta blockers. The current treatment provides no improvement. There are no compliance problems.     Review of systems  Constitutional: + rapidly increasing body weight,  current Body mass index is 41.9 kg/m. , + fatigue, no subjective hyperthermia, no subjective hypothermia Eyes: no blurry vision, no xerophthalmia ENT: no sore throat, no nodules palpated in throat, no dysphagia/odynophagia, no hoarseness Cardiovascular: no chest pain, no shortness of breath, no palpitations, no leg swelling Respiratory: no cough, no shortness of breath Gastrointestinal: no nausea/vomiting/diarrhea Musculoskeletal: no muscle/joint aches Skin: no rashes, no hyperemia Neurological: no tremors, no numbness, no tingling, no dizziness Psychiatric: + depression, no anxiety  Objective:     BP 110/74   Pulse 90   Ht 5' 10"  (1.778 m)   Wt 292 lb (132.5 kg)  BMI 41.90 kg/m   Wt Readings from Last 3 Encounters:  05/19/22 292 lb (132.5 kg)  05/18/22 293 lb 11.2  oz (133.2 kg)  03/27/22 283 lb (128.4 kg)    BP Readings from Last 3 Encounters:  05/19/22 110/74  05/18/22 111/73  03/31/22 127/62     Physical Exam- Limited  Constitutional:  Body mass index is 41.9 kg/m. , not in acute distress, normal state of mind, mildly slurred speech, dry mucus membranes Eyes:  EOMI, no exophthalmos Neck: Supple Cardiovascular: RRR, no murmurs, rubs, or gallops, no edema Respiratory: Adequate breathing efforts, no crackles, rales, rhonchi, or wheezing Musculoskeletal: no gross deformities, strength intact in all four extremities, no gross restriction of joint movements Skin:  no rashes, no hyperemia Neurological: no tremor with outstretched hands   Diabetic Foot Exam - Simple   No data filed    CMP ( most recent) CMP     Component Value Date/Time   NA 136 05/18/2022 1113   NA 140 12/03/2021 0832   K 3.9 05/18/2022 1113   CL 101 05/18/2022 1113   CO2 22 05/18/2022 1113   GLUCOSE 413 (H) 05/18/2022 1113   BUN 7 05/18/2022 1113   BUN 6 12/03/2021 0832   CREATININE 0.66 05/18/2022 1113   CALCIUM 8.4 (L) 05/18/2022 1113   PROT 6.6 05/18/2022 1113   PROT 5.8 (L) 12/03/2021 0832   ALBUMIN 3.0 (L) 12/03/2021 0832   AST 35 (H) 05/18/2022 1113   ALT 33 (H) 05/18/2022 1113   ALKPHOS 151 (H) 12/03/2021 0832   BILITOT 1.1 05/18/2022 1113   BILITOT 0.6 12/03/2021 0832   GFRNONAA >60 03/27/2022 0902   GFRAA >60 08/01/2020 0704     Diabetic Labs (most recent): Lab Results  Component Value Date   HGBA1C 10.5 05/19/2022   HGBA1C 8.4 (A) 02/10/2022   HGBA1C 10.9 (A) 11/05/2021   MICROALBUR 30 12/10/2021   MICROALBUR 80 02/26/2021     Lipid Panel ( most recent) Lipid Panel     Component Value Date/Time   CHOL 182 12/03/2021 0832   TRIG 190 (H) 12/03/2021 0832   HDL 46 12/03/2021 0832   CHOLHDL 4.0 12/03/2021 0832   LDLCALC 103 (H) 12/03/2021 0832   LABVLDL 33 12/03/2021 0832      Lab Results  Component Value Date   TSH 2.700  12/03/2021   TSH 0.635 09/08/2020   TSH 0.993 07/30/2020   TSH 1.990 07/23/2020   TSH 1.690 08/22/2010   FREET4 0.70 (L) 12/03/2021   FREET4 0.72 (L) 07/23/2020   FREET4 1.11 08/22/2010      Assessment & Plan:   1) Type 2 diabetes mellitus with hyperglycemia, without long-term current use of insulin (HCC)  - Nasreen Goedecke has currently uncontrolled symptomatic type 2 DM since  36 years of age.  She present today, accompanied by her mother, with her CGM showing significant hyperglycemia.  Her POCT A1c today is 10.5%, increasing from last visit of 8.4%.  Mom notes Delania has been eating more sweets lately, and they have not been working out as much due to the heat.  Analysis of her CGM shows TIR 0%, TAR (in very high range) 100%, TBR 0%.  - I had a long discussion with her about the progressive nature of diabetes and the pathology behind its complications.  -her diabetes is complicated by obesity/sedentary life and she remains at a high risk for more acute and chronic complications which include CAD, CVA, CKD, retinopathy, and neuropathy. These are all  discussed in detail with her.  - Nutritional counseling repeated at each appointment due to patients tendency to fall back in to old habits.  - The patient admits there is a room for improvement in their diet and drink choices. -  Suggestion is made for the patient to avoid simple carbohydrates from their diet including Cakes, Sweet Desserts / Pastries, Ice Cream, Soda (diet and regular), Sweet Tea, Candies, Chips, Cookies, Sweet Pastries, Store Bought Juices, Alcohol in Excess of 1-2 drinks a day, Artificial Sweeteners, Coffee Creamer, and "Sugar-free" Products. This will help patient to have stable blood glucose profile and potentially avoid unintended weight gain.   - I encouraged the patient to switch to unprocessed or minimally processed complex starch and increased protein intake (animal or plant source), fruits, and vegetables.   -  Patient is advised to stick to a routine mealtimes to eat 3 meals a day and avoid unnecessary snacks (to snack only to correct hypoglycemia).  - I have approached her with the following individualized plan to manage  her diabetes and patient agrees:   -She is advised to increase her U500 to 90 units with breakfast and lunch and 80 units with supper if glucose is above 90 and she is eating.  Will also increase her Ozempic to 2 mg SQ weekly to hopefully help with her sugar cravings.  She can continue Metformin 1000 mg po twice daily with meals and Glipizide 5 mg XL daily with breakfast.   -She is encouraged to continue to monitor glucose 4 times daily (using her CGM), before meals and before bed and log on the clinic sheets provided.  They are instructed to call the clinic if she has readings less than 70 or greater than 300 for 3 tests in a row.     - Specific targets for  A1c;  LDL, HDL,  and Triglycerides were discussed with the patient.  2) Blood Pressure /Hypertension: Her blood pressure is controlled to target.  She is advised to continue Lasix 80 mg po daily, continue Lisinopril 5 mg po daily, continue Propanolol 10 mg po TID, and Aldactone 100 mg o daily  3) Lipids/Hyperlipidemia:   Review of her recent lipid profile from 12/03/21 shows uncontrolled LDL at 103-improved and elevated triglycerides of 190-improved.  She is advised to continue Crestor 5 mg po daily at bedtime.  Side effects and precautions discussed with her.    4)  Weight/Diet:  Her Body mass index is 41.9 kg/m.  -   clearly complicating her diabetes care.   she is  a candidate for modest weight loss. I discussed with her the fact that loss of 5 - 10% of her  current body weight will have the most impact on her diabetes management.  Exercise, and detailed carbohydrates information provided  -  detailed on discharge instructions.  5) Vitamin D deficiency Her most recent vitamin d level on 12/03/21 was 9.8.  I discussed and  initiated replenishment with Ergocalciferol 50000 units po weekly- she is still taking this.  6) Chronic Care/Health Maintenance: -she is on ACE and statin medications and is encouraged to initiate and continue to follow up with Ophthalmology, Dentist,  Podiatrist at least yearly or according to recommendations, and advised to stay away from smoking. I have recommended yearly flu vaccine and pneumonia vaccine at least every 5 years; moderate intensity exercise for up to 150 minutes weekly; and  sleep for at least 7 hours a day.  - she is advised to maintain  close follow up with Neale Burly, MD for primary care needs, as well as her other providers for optimal and coordinated care.      I spent 46 minutes in the care of the patient today including review of labs from [place], Lipids, Thyroid Function, Hematology (current and previous including abstractions from other facilities); face-to-face time discussing  her blood glucose readings/logs, discussing hypoglycemia and hyperglycemia episodes and symptoms, medications doses, her options of short and long term treatment based on the latest standards of care / guidelines;  discussion about incorporating lifestyle medicine;  and documenting the encounter. Risk reduction counseling performed per USPSTF guidelines to reduce obesity and cardiovascular risk factors.     Please refer to Patient Instructions for Blood Glucose Monitoring and Insulin/Medications Dosing Guide"  in media tab for additional information. Please  also refer to " Patient Self Inventory" in the Media  tab for reviewed elements of pertinent patient history.  Remus Blake participated in the discussions, expressed understanding, and voiced agreement with the above plans.  All questions were answered to her satisfaction. she is encouraged to contact clinic should she have any questions or concerns prior to her return visit.    Follow up plan: - Return in about 1 month (around  06/19/2022) for Diabetes F/U, Bring meter and logs.    Rayetta Pigg, [facility] [address] [place], [place] [address] Phone: [phone] Fax: [phone]   05/19/2022, 11:09 AM

## 2022-05-19 NOTE — Progress Notes (Signed)
Patient aware of all.

## 2022-05-25 ENCOUNTER — Ambulatory Visit (HOSPITAL_COMMUNITY): Payer: Medicaid Other

## 2022-05-28 ENCOUNTER — Telehealth: Payer: Self-pay | Admitting: Nurse Practitioner

## 2022-05-28 ENCOUNTER — Other Ambulatory Visit: Payer: Self-pay | Admitting: Nurse Practitioner

## 2022-05-28 ENCOUNTER — Other Ambulatory Visit (INDEPENDENT_AMBULATORY_CARE_PROVIDER_SITE_OTHER): Payer: Self-pay | Admitting: Gastroenterology

## 2022-05-28 DIAGNOSIS — R1084 Generalized abdominal pain: Secondary | ICD-10-CM

## 2022-05-28 MED ORDER — TIRZEPATIDE 7.5 MG/0.5ML ~~LOC~~ SOAJ: 7.5000 mg | SUBCUTANEOUS | 1 refills | Status: DC

## 2022-05-28 NOTE — Telephone Encounter (Signed)
Jaime [name] said that Jaime [name] 92m is not even covered now. She also said that pharmacy never received the increase on Jaime [name], looks like it says no print so I do not think it went through. Can you fix that please?

## 2022-05-28 NOTE — Telephone Encounter (Signed)
Jaime [name], I will send in for [facility] instead.

## 2022-05-28 NOTE — Telephone Encounter (Signed)
I would prefer to try sending the Ozempic to a different pharmacy, perhaps even mail order pharmacy as they usually have a larger stock supply?  Trulicity is just not as effective.

## 2022-05-28 NOTE — Telephone Encounter (Signed)
Patient's mother is going to call around and let us know.

## 2022-05-28 NOTE — Telephone Encounter (Signed)
Patient's mom called and said ozempic is out of stock, can you call in Trulicity instead to [facility]

## 2022-05-28 NOTE — Addendum Note (Signed)
Addended by: Brita Romp on: 05/28/2022 04:12 PM   Modules accepted: Orders

## 2022-05-28 NOTE — Telephone Encounter (Signed)
Last visit 05/18/22 and next visit is 11/19/22

## 2022-05-29 MED ORDER — HUMULIN R U-500 KWIKPEN 500 UNIT/ML ~~LOC~~ SOPN: 80.0000 [IU] | PEN_INJECTOR | Freq: Three times a day (TID) | SUBCUTANEOUS | 2 refills | Status: DC

## 2022-05-29 NOTE — Telephone Encounter (Signed)
I sent in the refill for the correct dose of U500.  When I talked with Amber the other day, she says Medicaid is covering [place] so hopefully she will be able to get that one.

## 2022-05-29 NOTE — Addendum Note (Signed)
Addended by: Brita Romp on: 05/29/2022 07:20 AM   Modules accepted: Orders

## 2022-06-23 ENCOUNTER — Other Ambulatory Visit (HOSPITAL_COMMUNITY): Payer: Medicaid Other

## 2022-06-23 ENCOUNTER — Encounter: Payer: Self-pay | Admitting: Nurse Practitioner

## 2022-06-23 ENCOUNTER — Ambulatory Visit (INDEPENDENT_AMBULATORY_CARE_PROVIDER_SITE_OTHER): Payer: Medicaid Other | Admitting: Nurse Practitioner

## 2022-06-23 VITALS — BP 103/69 | HR 97 | Ht 70.0 in | Wt 282.6 lb

## 2022-06-23 DIAGNOSIS — E1165 Type 2 diabetes mellitus with hyperglycemia: Secondary | ICD-10-CM

## 2022-06-23 DIAGNOSIS — Z91119 Patient's noncompliance with dietary regimen due to unspecified reason: Secondary | ICD-10-CM | POA: Diagnosis not present

## 2022-06-23 DIAGNOSIS — E782 Mixed hyperlipidemia: Secondary | ICD-10-CM

## 2022-06-23 MED ORDER — TIRZEPATIDE 7.5 MG/0.5ML ~~LOC~~ SOAJ: 7.5000 mg | SUBCUTANEOUS | 1 refills | Status: DC

## 2022-06-23 MED ORDER — GLIPIZIDE ER 5 MG PO TB24: 5.0000 mg | ORAL_TABLET | Freq: Every day | ORAL | 1 refills | Status: DC

## 2022-06-23 MED ORDER — DEXCOM G6 SENSOR MISC
2 refills | Status: DC
Start: 2022-06-23 — End: 2022-09-07

## 2022-06-23 MED ORDER — HUMULIN R U-500 KWIKPEN 500 UNIT/ML ~~LOC~~ SOPN
90.0000 [IU] | PEN_INJECTOR | Freq: Three times a day (TID) | SUBCUTANEOUS | 2 refills | Status: DC
Start: 2022-06-23 — End: 2022-09-29

## 2022-06-23 MED ORDER — DEXCOM G6 TRANSMITTER MISC: 1 refills | Status: DC

## 2022-06-23 NOTE — Progress Notes (Signed)
06/23/2022, 9:14 AM     Endocrinology Follow Up Visit  Subjective:    Patient ID: Jaime [name], female    DOB: [birth year].  Jaime [name] is being seen in follow up for management of currently uncontrolled symptomatic diabetes requested by  Neale Burly, MD.   Past Medical History:  Diagnosis Date   Anxiety    Asthma    Chronic abdominal pain    Chronic back pain    Cirrhosis ([employer])    Cirrhosis ([place])    COPD (chronic obstructive pulmonary disease) ([place])    Depression    Diabetes mellitus without complication ([place])    Diabetes mellitus, type II ([place])    GERD (gastroesophageal reflux disease)    Hepatitis C    Hyperlipidemia    Insomnia    Long-term current use of methadone for opiate dependence ([place])    Lupus ([place])    Migraine headache    Neuropathy    Nocturnal seizures ([place])    Peptic ulcer    Spleen enlarged     Past Surgical History:  Procedure Laterality Date   BIOPSY  11/25/2021   Procedure: BIOPSY;  Surgeon: Harvel Quale, MD;  [facility];  Service: Gastroenterology;;   CHOLECYSTECTOMY     COLONOSCOPY WITH PROPOFOL N/A 11/25/2021   Procedure: COLONOSCOPY WITH PROPOFOL;  Surgeon: Harvel Quale, MD;  Location: [facility];  Service: Gastroenterology;  Laterality: N/A;  805   COLONOSCOPY WITH PROPOFOL N/A 03/31/2022   Procedure: COLONOSCOPY WITH PROPOFOL;  Surgeon: Harvel Quale, MD;  [facility]: [facility];  Service: Gastroenterology;  Laterality: N/A;  730   ESOPHAGOGASTRODUODENOSCOPY  06/2020   done at [facility], candida in upper esophagus (treated with diflucan), ulcerative esophagitis at GE junction, gastritis in stomach, single ulcer in duodenal bulb, with duodenal mucosa showing no abnormality. No presence of varices   ESOPHAGOGASTRODUODENOSCOPY (EGD) WITH PROPOFOL N/A 11/25/2021   Procedure: ESOPHAGOGASTRODUODENOSCOPY (EGD)  WITH PROPOFOL;  Surgeon: Harvel Quale, MD;  [facility];  Service: Gastroenterology;  Laterality: N/A;   ESOPHAGOGASTRODUODENOSCOPY (EGD) WITH PROPOFOL N/A 03/31/2022   Procedure: ESOPHAGOGASTRODUODENOSCOPY (EGD) WITH PROPOFOL;  Surgeon: Harvel Quale, MD;  [facility];  Service: Gastroenterology;  Laterality: N/A;    Social History   Socioeconomic History   Marital status: Single    Spouse name: Not on file   Number of children: Not on file   Years of education: Not on file   Highest education level: Not on file  Occupational History   Not on file  Tobacco Use   Smoking status: Every Day    Types: E-cigarettes   Smokeless tobacco: Never  Vaping Use   Vaping Use: Every day   Substances: Nicotine, Flavoring  Substance and Sexual Activity   Alcohol use: No   Drug use: No   Sexual activity: Not Currently    Birth control/protection: Implant  Other Topics Concern   Not on file  Social History Narrative   Not on file   Social Determinants of Health   Financial Resource Strain: Medium Risk (05/09/2020)   Overall Financial Resource  Strain (CARDIA)    Difficulty of Paying Living Expenses: Somewhat hard  Food Insecurity: No Food Insecurity (05/09/2020)   Hunger Vital Sign    Worried About Running Out of Food in the Last Year: Never true    Ran Out of Food in the Last Year: Never true  Transportation Needs: No Transportation Needs (05/09/2020)   PRAPARE - Hydrologist (Medical): No    Lack of Transportation (Non-Medical): No  Physical Activity: Insufficiently Active (05/09/2020)   Exercise Vital Sign    Days of Exercise per Week: 1 day    Minutes of Exercise per Session: 20 min  Stress: Stress Concern Present (05/09/2020)   [place]    Feeling of Stress : Very much  Social Connections: Moderately Isolated (05/09/2020)   Social Connection and  Isolation Panel [NHANES]    Frequency of Communication with Friends and Family: More than three times a week    Frequency of Social Gatherings with Friends and Family: Twice a week    Attends Religious Services: More than 4 times per year    Active Member of [employer] or Organizations: No    Attends Archivist Meetings: Never    Marital Status: Never married    Family History  Problem Relation Age of Onset   Hypertension Mother    Hyperlipidemia Mother    Hyperlipidemia Maternal Grandfather     Outpatient Encounter Medications as of 06/23/2022  Medication Sig   Accu-Chek Softclix Lancets lancets USE TO TEST BLOOD SUGAR 4 TIMES A DAY.   albuterol (ACCUNEB) 0.63 MG/3ML nebulizer solution Take 1 ampule by nebulization every 6 (six) hours as needed for wheezing.   ARIPiprazole (ABILIFY) 20 MG tablet Take 20 mg by mouth daily.   aspirin EC 81 MG tablet Take 81 mg by mouth daily. Swallow whole.   blood glucose meter kit and supplies Dispense based on patient and insurance preference. Use up to four times daily as directed. (FOR ICD-10 E10.9, E11.9).   cetirizine (ZYRTEC ALLERGY) 10 MG tablet Take 1 tablet (10 mg total) by mouth daily.   clonazePAM (KLONOPIN) 0.5 MG tablet Take 0.5 mg by mouth 2 (two) times daily.   cromolyn (OPTICROM) 4 % ophthalmic solution Place 1 drop into both eyes 2 (two) times daily.   etonogestrel (NEXPLANON) 68 MG IMPL implant 1 each by Subdermal route once.   furosemide (LASIX) 40 MG tablet Take 40 mg by mouth daily.   glucose blood (ACCU-CHEK GUIDE) test strip Use as instructed to check blood glucose four times daily   hyoscyamine (ANASPAZ) 0.125 MG TBDP disintergrating tablet TAKE 1 TABLET UNDER THE TONGUE THREE TIMES A DAY AS NEEDED FOR BLADDER SPASMS OR CRAMPING.   Insulin Pen Needle (GLOBAL EASE INJECT PEN NEEDLES) 31G X 8 MM MISC Use 1 pen needle 4 times daily for insulin.   Insulin Pen Needle (PEN NEEDLES) 32G X 6 MM MISC 1 each by Does not apply route 3  (three) times daily.   linaclotide (LINZESS) 290 MCG CAPS capsule Take 1 capsule (290 mcg total) by mouth daily before breakfast.   lisinopril (ZESTRIL) 5 MG tablet TAKE 1 TABLET ONCE DAILY.   metFORMIN (GLUCOPHAGE) 1000 MG tablet Take 1 tablet (1,000 mg total) by mouth 2 (two) times daily with a meal.   methadone (DOLOPHINE) 10 MG/5ML solution Take 60 mg by mouth every morning.   methocarbamol (ROBAXIN) 500 MG tablet Take by mouth at bedtime.  Multiple Vitamin (MULTIVITAMIN) tablet Take 1 tablet by mouth daily.   naloxegol oxalate (MOVANTIK) 25 MG TABS tablet Take 1 tablet (25 mg total) by mouth daily.   nystatin (MYCOSTATIN/NYSTOP) powder Apply 1 application topically 3 (three) times daily. (Patient taking differently: Apply 1 application  topically 3 (three) times daily as needed (irritation).)   omeprazole (PRILOSEC) 40 MG capsule TAKE 1 CAPSULE BY MOUTH TWICE DAILY.   polyethylene glycol-electrolytes (TRILYTE) 420 g solution Take 4,000 mLs by mouth as directed.   potassium chloride (KLOR-CON M) 10 MEQ tablet Take 10 mEq by mouth 2 (two) times daily.   pregabalin (LYRICA) 75 MG capsule Take 75 mg by mouth in the morning, at noon, in the evening, and at bedtime.    PROAIR HFA 108 (90 Base) MCG/ACT inhaler Inhale 1-2 puffs into the lungs every 6 (six) hours as needed for wheezing or shortness of breath.   promethazine (PHENERGAN) 12.5 MG tablet Take 1 tablet (12.5 mg total) by mouth every 8 (eight) hours as needed for nausea or vomiting.   propranolol (INDERAL) 10 MG tablet Take 10 mg by mouth 3 (three) times daily.   RESTASIS 0.05 % ophthalmic emulsion Place 1 drop into both eyes as needed (dry eye).    rosuvastatin (CRESTOR) 5 MG tablet Take 1 tablet (5 mg total) by mouth daily.   spironolactone (ALDACTONE) 100 MG tablet Take 100 mg by mouth daily.    sucralfate (CARAFATE) 1 GM/10ML suspension Take 10 mLs (1 g total) by mouth 4 (four) times daily -  with meals and at bedtime.   triamcinolone  (KENALOG) 0.1 % Apply 1 application topically daily as needed (irritation).   venlafaxine XR (EFFEXOR-XR) 150 MG 24 hr capsule Take total of 225 mg daily (150 mg + 75 mg )   venlafaxine XR (EFFEXOR-XR) 75 MG 24 hr capsule To take along with 144m for a total of 225 mg daily   Vitamin D, Ergocalciferol, (DRISDOL) 1.25 MG (50000 UNIT) CAPS capsule Take 1 capsule (50,000 Units total) by mouth every 7 (seven) days. (Patient taking differently: Take 50,000 Units by mouth every Tuesday.)   [DISCONTINUED] Continuous Blood Gluc Sensor (DEXCOM G6 SENSOR) MISC APPLY 1 SENSOR EVERY 10 DAYS   [DISCONTINUED] Continuous Blood Gluc Transmit (DEXCOM G6 TRANSMITTER) MISC USE AS DIRECTED.   [DISCONTINUED] glipiZIDE (GLUCOTROL XL) 5 MG 24 hr tablet Take 1 tablet (5 mg total) by mouth daily with breakfast.   [DISCONTINUED] insulin regular human CONCENTRATED (HUMULIN R U-500 KWIKPEN) 500 UNIT/ML KwikPen Inject 80-90 Units into the skin 3 (three) times daily with meals. Inject 90 units with breakfast and lunch and 80 with supper if glucose is above 90 and eating   [DISCONTINUED] tirzepatide (MOUNJARO) 7.5 MG/0.5ML Pen Inject 7.5 mg into the skin once a week.   Continuous Blood Gluc Sensor (DEXCOM G6 SENSOR) MISC APPLY 1 SENSOR EVERY 10 DAYS   Continuous Blood Gluc Transmit (DEXCOM G6 TRANSMITTER) MISC USE AS DIRECTED.   glipiZIDE (GLUCOTROL XL) 5 MG 24 hr tablet Take 1 tablet (5 mg total) by mouth daily with breakfast.   insulin regular human CONCENTRATED (HUMULIN R U-500 KWIKPEN) 500 UNIT/ML KwikPen Inject 90 Units into the skin 3 (three) times daily with meals.   tirzepatide (MOUNJARO) 7.5 MG/0.5ML Pen Inject 7.5 mg into the skin once a week.   No facility-administered encounter medications on file as of 06/23/2022.    ALLERGIES: Allergies  Allergen Reactions   Iodine-131 Anaphylaxis, Shortness Of Breath and Swelling   Ivp Dye [Iodinated  Contrast Media] Anaphylaxis    Skin gets very red, unable to walk     Ketorolac Tromethamine Shortness Of Breath   Tylenol [Acetaminophen] Other (See Comments)    Due to cirrhosis    Vancomycin Swelling    Facial swelling,    Gabapentin Itching   Nsaids Other (See Comments)    Flares ulcers   Suboxone [Buprenorphine Hcl-Naloxone Hcl]     Withdrawal symptoms     VACCINATION STATUS: Immunization History  Administered Date(s) Administered   Influenza,inj,Quad PF,6+ Mos 07/18/2016   Moderna Sars-Covid-2 Vaccination 03/07/2020, 04/04/2020   Pneumococcal Polysaccharide-23 07/18/2016, 08/01/2020    Diabetes She presents for her follow-up diabetic visit. She has type 2 diabetes mellitus. Onset time: She was diagnosed at approximate age of 65 years. Her disease course has been fluctuating. There are no hypoglycemic associated symptoms. Pertinent negatives for hypoglycemia include no headaches or pallor. (Had symptoms of hypoglycemia when glucose dropped to 117.) Associated symptoms include fatigue, foot paresthesias, polydipsia and polyuria. Pertinent negatives for diabetes include no blurred vision, no chest pain, no polyphagia and no weight loss. There are no hypoglycemic complications. (History of unresponsiveness requiring EMS and hospitalization- none recent) Symptoms are stable. Diabetic complications include heart disease and peripheral neuropathy. Risk factors for coronary artery disease include diabetes mellitus, obesity, sedentary lifestyle, dyslipidemia and hypertension. Current diabetic treatment includes oral agent (dual therapy) and intensive insulin program (and Mounjaro). She is compliant with treatment most of the time. Her weight is decreasing steadily. She is following a low salt and generally unhealthy diet. When asked about meal planning, she reported none. She has had a previous visit with a dietitian. She participates in exercise intermittently. Her home blood glucose trend is fluctuating minimally. Her breakfast blood glucose range is generally  >200 mg/dl. Her lunch blood glucose range is generally >200 mg/dl. Her dinner blood glucose range is generally >200 mg/dl. Her bedtime blood glucose range is generally >200 mg/dl. Her overall blood glucose range is >200 mg/dl. (She present today, accompanied by her mother, with her CGM showing some improvement in her glucose (especially when she was on vacation) but now trending back upwards.  She was not due for another A1c today.  Analysis of her CGM shows TIR 3%, TAR 97% (82% in very high range), TBR 0% with a GMI of 10.5%.  She denies any hypoglycemia.) An ACE inhibitor/angiotensin II receptor blocker is being taken. She does not see a podiatrist.Eye exam is not current.  Hypertension This is a chronic problem. The current episode started more than 1 month ago. The problem has been resolved since onset. The problem is controlled. Associated symptoms include peripheral edema. Pertinent negatives include no blurred vision, chest pain, headaches, palpitations or shortness of breath. There are no associated agents to hypertension. Risk factors for coronary artery disease include diabetes mellitus, dyslipidemia, obesity and sedentary lifestyle. Past treatments include diuretics and beta blockers. The current treatment provides no improvement. There are no compliance problems.     Review of systems  Constitutional: + decreasing body weight,  current Body mass index is 40.55 kg/m. , + fatigue, no subjective hyperthermia, no subjective hypothermia Eyes: no blurry vision, no xerophthalmia ENT: no sore throat, no nodules palpated in throat, no dysphagia/odynophagia, no hoarseness Cardiovascular: no chest pain, no shortness of breath, no palpitations, no leg swelling Respiratory: no cough, no shortness of breath Gastrointestinal: no nausea/vomiting/diarrhea Musculoskeletal: no muscle/joint aches Skin: no rashes, no hyperemia Neurological: no tremors, no numbness, no tingling, no dizziness Psychiatric: +  depression, no anxiety  Objective:     BP 103/69 (BP Location: Left Arm, Patient Position: Sitting, Cuff Size: Large)   Pulse 97   Ht 5' 10"  (1.778 m)   Wt 282 lb 9.6 oz (128.2 kg)   BMI 40.55 kg/m   Wt Readings from Last 3 Encounters:  06/23/22 282 lb 9.6 oz (128.2 kg)  05/19/22 292 lb (132.5 kg)  05/18/22 293 lb 11.2 oz (133.2 kg)    BP Readings from Last 3 Encounters:  06/23/22 103/69  05/19/22 110/74  05/18/22 111/73     Physical Exam- Limited  Constitutional:  Body mass index is 40.55 kg/m. , not in acute distress, normal state of mind, mildly slurred speech, dry mucus membranes Eyes:  EOMI, no exophthalmos Neck: Supple Cardiovascular: RRR, no murmurs, rubs, or gallops, no edema Respiratory: Adequate breathing efforts, no crackles, rales, rhonchi, or wheezing Musculoskeletal: no gross deformities, strength intact in all four extremities, no gross restriction of joint movements Skin:  no rashes, no hyperemia Neurological: no tremor with outstretched hands   Diabetic Foot Exam - Simple   No data filed    CMP ( most recent) CMP     Component Value Date/Time   NA 136 05/18/2022 1113   NA 140 12/03/2021 0832   K 3.9 05/18/2022 1113   CL 101 05/18/2022 1113   CO2 22 05/18/2022 1113   GLUCOSE 413 (H) 05/18/2022 1113   BUN 7 05/18/2022 1113   BUN 6 12/03/2021 0832   CREATININE 0.66 05/18/2022 1113   CALCIUM 8.4 (L) 05/18/2022 1113   PROT 6.6 05/18/2022 1113   PROT 5.8 (L) 12/03/2021 0832   ALBUMIN 3.0 (L) 12/03/2021 0832   AST 35 (H) 05/18/2022 1113   ALT 33 (H) 05/18/2022 1113   ALKPHOS 151 (H) 12/03/2021 0832   BILITOT 1.1 05/18/2022 1113   BILITOT 0.6 12/03/2021 0832   GFRNONAA >60 03/27/2022 0902   GFRAA >60 08/01/2020 0704     Diabetic Labs (most recent): Lab Results  Component Value Date   HGBA1C 10.5 05/19/2022   HGBA1C 8.4 (A) 02/10/2022   HGBA1C 10.9 (A) 11/05/2021   MICROALBUR 30 12/10/2021   MICROALBUR 80 02/26/2021     Lipid Panel  ( most recent) Lipid Panel     Component Value Date/Time   CHOL 182 12/03/2021 0832   TRIG 190 (H) 12/03/2021 0832   HDL 46 12/03/2021 0832   CHOLHDL 4.0 12/03/2021 0832   LDLCALC 103 (H) 12/03/2021 0832   LABVLDL 33 12/03/2021 0832      Lab Results  Component Value Date   TSH 2.700 12/03/2021   TSH 0.635 09/08/2020   TSH 0.993 07/30/2020   TSH 1.990 07/23/2020   TSH 1.690 08/22/2010   FREET4 0.70 (L) 12/03/2021   FREET4 0.72 (L) 07/23/2020   FREET4 1.11 08/22/2010      Assessment & Plan:   1) Type 2 diabetes mellitus with hyperglycemia, without long-term current use of insulin (HCC)  - Jaime [name] has currently uncontrolled symptomatic type 2 DM since  36 years of age.  She present today, accompanied by her mother, with her CGM showing some improvement in her glucose (especially when she was on vacation) but now trending back upwards.  She was not due for another A1c today.  Analysis of her CGM shows TIR 3%, TAR 97% (82% in very high range), TBR 0% with a GMI of 10.5%.  She denies any hypoglycemia.  - I had a long discussion with her about the  progressive nature of diabetes and the pathology behind its complications.  -her diabetes is complicated by obesity/sedentary life and she remains at a high risk for more acute and chronic complications which include CAD, CVA, CKD, retinopathy, and neuropathy. These are all discussed in detail with her.  - Nutritional counseling repeated at each appointment due to patients tendency to fall back in to old habits.  - The patient admits there is a room for improvement in their diet and drink choices. -  Suggestion is made for the patient to avoid simple carbohydrates from their diet including Cakes, Sweet Desserts / Pastries, Ice Cream, Soda (diet and regular), Sweet Tea, Candies, Chips, Cookies, Sweet Pastries, Store Bought Juices, Alcohol in Excess of 1-2 drinks a day, Artificial Sweeteners, Coffee Creamer, and "Sugar-free" Products.  This will help patient to have stable blood glucose profile and potentially avoid unintended weight gain.   - I encouraged the patient to switch to unprocessed or minimally processed complex starch and increased protein intake (animal or plant source), fruits, and vegetables.   - Patient is advised to stick to a routine mealtimes to eat 3 meals a day and avoid unnecessary snacks (to snack only to correct hypoglycemia).  - I have approached her with the following individualized plan to manage  her diabetes and patient agrees:   -She is advised to continue her U500 at 90 units with breakfast and lunch and 80 units with supper if glucose is above 90 and she is eating.  She can continue her Mounjaro 7.5 mg SQ weekly.  She can also continue Metformin 1000 mg po twice daily with meals and Glipizide 5 mg XL daily with breakfast.   -She is encouraged to continue to monitor glucose 4 times daily (using her CGM), before meals and before bed and log on the clinic sheets provided.  They are instructed to call the clinic if she has readings less than 70 or greater than 300 for 3 tests in a row.     - Specific targets for  A1c;  LDL, HDL,  and Triglycerides were discussed with the patient.  2) Blood Pressure /Hypertension: Her blood pressure is controlled to target.  She is advised to continue Lasix 80 mg po daily, continue Lisinopril 5 mg po daily, continue Propanolol 10 mg po TID, and Aldactone 100 mg o daily  3) Lipids/Hyperlipidemia:   Review of her recent lipid profile from 12/03/21 shows uncontrolled LDL at 103-improved and elevated triglycerides of 190-improved.  She is advised to continue Crestor 5 mg po daily at bedtime.  Side effects and precautions discussed with her.    4)  Weight/Diet:  Her Body mass index is 40.55 kg/m.  -   clearly complicating her diabetes care.   she is  a candidate for modest weight loss. I discussed with her the fact that loss of 5 - 10% of her  current body weight will have  the most impact on her diabetes management.  Exercise, and detailed carbohydrates information provided  -  detailed on discharge instructions.  5) Vitamin D deficiency Her most recent vitamin d level on 12/03/21 was 9.8.  I discussed and initiated replenishment with Ergocalciferol 50000 units po weekly- she is still taking this.  6) Chronic Care/Health Maintenance: -she is on ACE and statin medications and is encouraged to initiate and continue to follow up with Ophthalmology, Dentist,  Podiatrist at least yearly or according to recommendations, and advised to stay away from smoking. I have recommended yearly flu  vaccine and pneumonia vaccine at least every 5 years; moderate intensity exercise for up to 150 minutes weekly; and  sleep for at least 7 hours a day.  - she is advised to maintain close follow up with Hasanaj, Samul Dada, MD for primary care needs, as well as her other providers for optimal and coordinated care.      I spent 34 minutes in the care of the patient today including review of labs from [facility] [place], Lipids, Thyroid Function, Hematology (current and previous including abstractions from other facilities); face-to-face time discussing  her blood glucose readings/logs, discussing hypoglycemia and hyperglycemia episodes and symptoms, medications doses, her options of short and long term treatment based on the latest standards of care / guidelines;  discussion about incorporating lifestyle medicine;  and documenting the encounter. Risk reduction counseling performed per USPSTF guidelines to reduce obesity and cardiovascular risk factors.     Please refer to Patient Instructions for Blood Glucose Monitoring and Insulin/Medications Dosing Guide"  in media tab for additional information. Please  also refer to " Patient Self Inventory" in the Media  tab for reviewed elements of pertinent patient history.  Jaime [name] participated in the discussions, expressed understanding, and voiced agreement  with the above plans.  All questions were answered to her satisfaction. she is encouraged to contact clinic should she have any questions or concerns prior to her return visit.    Follow up plan: - Return in about 3 months (around 09/23/2022) for Diabetes F/U with A1c in office, Bring meter and logs, No previsit labs.    Jaime [name], [facility] [address] [place], [place] [address] Phone: [phone] Fax: [phone]   06/23/2022, 9:14 AM

## 2022-06-24 ENCOUNTER — Telehealth: Payer: Self-pay | Admitting: Nurse Practitioner

## 2022-06-24 NOTE — Telephone Encounter (Signed)
New message    Mother called need a note to bring into [facility] insulin injection.    Will come by the office after 2 pm to pick up note.

## 2022-06-25 ENCOUNTER — Ambulatory Visit (HOSPITAL_COMMUNITY): Admit: 2022-06-25 | Payer: Medicaid Other | Admitting: Gastroenterology

## 2022-06-25 ENCOUNTER — Encounter: Payer: Self-pay | Admitting: Nurse Practitioner

## 2022-06-25 ENCOUNTER — Encounter (HOSPITAL_COMMUNITY): Payer: Self-pay

## 2022-06-25 SURGERY — COLONOSCOPY WITH PROPOFOL
Anesthesia: Monitor Anesthesia Care

## 2022-06-25 NOTE — Telephone Encounter (Signed)
I sent Jaime [name] an email with the note to have her print it on [facility] letterhead.

## 2022-07-12 ENCOUNTER — Encounter: Payer: Self-pay | Admitting: Emergency Medicine

## 2022-07-12 ENCOUNTER — Ambulatory Visit
Admission: EM | Admit: 2022-07-12 | Discharge: 2022-07-12 | Disposition: A | Discharge status: Home / Self Care | Payer: Medicaid Other | Attending: Physician Assistant | Admitting: Physician Assistant

## 2022-07-12 DIAGNOSIS — Z20822 Contact with and (suspected) exposure to covid-19: Secondary | ICD-10-CM | POA: Diagnosis not present

## 2022-07-12 DIAGNOSIS — R051 Acute cough: Secondary | ICD-10-CM | POA: Diagnosis present

## 2022-07-12 DIAGNOSIS — R432 Parageusia: Secondary | ICD-10-CM | POA: Insufficient documentation

## 2022-07-12 DIAGNOSIS — J069 Acute upper respiratory infection, unspecified: Secondary | ICD-10-CM | POA: Insufficient documentation

## 2022-07-12 MED ORDER — BENZONATATE 100 MG PO CAPS: 100.0000 mg | ORAL_CAPSULE | Freq: Three times a day (TID) | ORAL | 0 refills | Status: DC

## 2022-07-12 NOTE — Discharge Instructions (Signed)
Monitor your MyChart for your COVID results.  Use Tessalon for cough.  You can use over-the-counter medication as needed.  Make sure you are resting and drinking plenty of fluid.  If your symptoms are not improving quickly or if anything worsens please return for reevaluation.  Follow-up with your PCP next week.

## 2022-07-12 NOTE — ED Provider Notes (Signed)
[facility]    CSN: [id number] Arrival date & time: 07/12/22  1000      History   Chief Complaint Chief Complaint  Patient presents with   Cough    HPI Jaime [name] is a 36 y.o. female.   Patient presents today with a weeklong history of URI symptoms.  She reports congestion, fever, and cough and has also had difficulty tasting anything.  She reports that her smell is slightly altered but not completely gone and her primary concern is that she is unable to taste anything.  She has tried multiple foods and she does not use them.  She has not had COVID in the past.  She has had COVID vaccines.  She did not take an at-home COVID test.  She is eating and drinking normally.      Past Medical History:  Diagnosis Date   Anxiety    Asthma    Chronic abdominal pain    Chronic back pain    Cirrhosis (HCC)    Cirrhosis ([place])    COPD (chronic obstructive pulmonary disease) (HCC)    Depression    Diabetes mellitus without complication ([place])    Diabetes mellitus, type II ([place])    GERD (gastroesophageal reflux disease)    Hepatitis C    Hyperlipidemia    Insomnia    Long-term current use of methadone for opiate dependence ([place])    Lupus ([place])    Migraine headache    Neuropathy    Nocturnal seizures (HCC)    Peptic ulcer    Spleen enlarged     Patient Active Problem List   Diagnosis Date Noted   Constipation 05/18/2022   IBS (irritable bowel syndrome) 02/16/2022   Chronic prescription opiate use 02/16/2022   Abdominal pain 11/17/2021   Gastroesophageal reflux disease 10/23/2021   Nausea and vomiting 10/23/2021   Diarrhea 10/23/2021   Rectal bleeding 07/28/2021   Melena 07/28/2021   Pain of upper abdomen 07/28/2021   Non-intractable vomiting 07/28/2021   Adjustment disorder with depressed mood 09/07/2020   Respiratory failure ([place]) 08/31/2020   Acute encephalopathy 08/31/2020   Hyponatremia 08/16/2020   Volume overload 08/14/2020   Dyspnea 07/29/2020    Nexplanon insertion 05/20/2020   Nexplanon removal 05/20/2020   Pressure injury of skin 01/29/2019   Uncontrolled diabetes mellitus 09/19/2018   Hyperglycemia 09/17/2018   Acute respiratory failure with hypoxia ([place]) 09/15/2018   Anxiety 09/15/2018   Chronic abdominal pain 09/15/2018   Diabetes mellitus without complication ([place]) 97/35/3299   Hepatitis C 09/15/2018   Peptic ulcer 09/15/2018   Long-term current use of methadone for opiate dependence ([place]) 09/15/2018   Cirrhosis ([place]) 09/15/2018   Acute hypoxemic respiratory failure ([place]) 07/17/2016   Pleural effusion on right 07/17/2016   Multiple rib fractures involving four or more ribs 07/17/2016   Chronic pain disorder 07/17/2016    Past Surgical History:  Procedure Laterality Date   BIOPSY  11/25/2021   Procedure: BIOPSY;  Surgeon: Harvel Quale, MD;  [facility];  Service: Gastroenterology;;   CHOLECYSTECTOMY     COLONOSCOPY WITH PROPOFOL N/A 11/25/2021   Procedure: COLONOSCOPY WITH PROPOFOL;  Surgeon: Harvel Quale, MD;  [facility]: [facility];  Service: Gastroenterology;  Laterality: N/A;  805   COLONOSCOPY WITH PROPOFOL N/A 03/31/2022   Procedure: COLONOSCOPY WITH PROPOFOL;  Surgeon: Harvel Quale, MD;  [facility];  Service: Gastroenterology;  Laterality: N/A;  730   ESOPHAGOGASTRODUODENOSCOPY  06/2020   done at  [facility], candida in upper esophagus (treated with diflucan), ulcerative esophagitis at GE junction, gastritis in stomach, single ulcer in duodenal bulb, with duodenal mucosa showing no abnormality. No presence of varices   ESOPHAGOGASTRODUODENOSCOPY (EGD) WITH PROPOFOL N/A 11/25/2021   Procedure: ESOPHAGOGASTRODUODENOSCOPY (EGD) WITH PROPOFOL;  Surgeon: Harvel Quale, MD;  [facility];  Service: Gastroenterology;  Laterality: N/A;   ESOPHAGOGASTRODUODENOSCOPY (EGD) WITH PROPOFOL N/A 03/31/2022   Procedure: ESOPHAGOGASTRODUODENOSCOPY (EGD)  WITH PROPOFOL;  Surgeon: Harvel Quale, MD;  [facility];  Service: Gastroenterology;  Laterality: N/A;    OB History     Gravida  0   Para  0   Term  0   Preterm  0   AB  0   Living  0      SAB  0   IAB  0   Ectopic  0   Multiple  0   Live Births  0            Home Medications    Prior to Admission medications   Medication Sig Start Date End Date Taking? Authorizing Provider  Accu-Chek Softclix Lancets lancets USE TO TEST BLOOD SUGAR 4 TIMES A DAY. 05/28/22  Yes Brita Romp, NP  albuterol (ACCUNEB) 0.63 MG/3ML nebulizer solution Take 1 ampule by nebulization every 6 (six) hours as needed for wheezing. 07/27/20  Yes [provider]  ARIPiprazole (ABILIFY) 20 MG tablet Take 20 mg by mouth daily. 10/15/21  Yes [provider]  aspirin EC 81 MG tablet Take 81 mg by mouth daily. Swallow whole.   Yes [provider]  benzonatate (TESSALON) 100 MG capsule Take 1 capsule (100 mg total) by mouth every 8 (eight) hours. 07/12/22  Yes Sabrine Patchen K, PA-C  blood glucose meter kit and supplies Dispense based on patient and insurance preference. Use up to four times daily as directed. (FOR ICD-10 E10.9, E11.9). 11/12/21  Yes Brita Romp, NP  cetirizine (ZYRTEC ALLERGY) 10 MG tablet Take 1 tablet (10 mg total) by mouth daily. 07/09/20  Yes Avegno, Darrelyn Hillock, FNP  clonazePAM (KLONOPIN) 0.5 MG tablet Take 0.5 mg by mouth 2 (two) times daily.   Yes [provider]  Continuous Blood Gluc Sensor (DEXCOM G6 SENSOR) MISC APPLY 1 SENSOR EVERY 10 DAYS 06/23/22  Yes Brita Romp, NP  Continuous Blood Gluc Transmit (DEXCOM G6 TRANSMITTER) MISC USE AS DIRECTED. 06/23/22  Yes Brita Romp, NP  cromolyn (OPTICROM) 4 % ophthalmic solution Place 1 drop into both eyes 2 (two) times daily. 01/28/21  Yes [provider]  etonogestrel (NEXPLANON) 68 MG IMPL implant 1 each by Subdermal route once.   Yes [provider]  furosemide (LASIX) 40 MG tablet Take 40 mg by mouth daily. 05/22/20  Yes [provider]  glipiZIDE (GLUCOTROL XL) 5 MG 24 hr tablet Take 1 tablet (5 mg total) by mouth daily with breakfast. 06/23/22  Yes Brita Romp, NP  glucose blood (ACCU-CHEK GUIDE) test strip Use as instructed to check blood glucose four times daily 12/01/21  Yes Reardon, Loree Fee J, NP  hyoscyamine (ANASPAZ) 0.125 MG TBDP disintergrating tablet TAKE 1 TABLET UNDER THE TONGUE THREE TIMES A DAY AS NEEDED FOR BLADDER SPASMS OR CRAMPING. 05/28/22  Yes Montez Morita, Daniel, MD  Insulin Pen Needle (GLOBAL EASE INJECT PEN NEEDLES) 31G X 8 MM MISC Use 1 pen needle 4 times daily for insulin. 11/10/21  Yes Brita Romp, NP  Insulin Pen Needle (PEN  NEEDLES) 32G X 6 MM MISC 1 each by Does not apply route 3 (three) times daily. 02/26/21  Yes Reardon, Juanetta Beets, NP  insulin regular human CONCENTRATED (HUMULIN R U-500 KWIKPEN) 500 UNIT/ML KwikPen Inject 90 Units into the skin 3 (three) times daily with meals. 06/23/22  Yes Brita Romp, NP  linaclotide Rolan Lipa) 290 MCG CAPS capsule Take 1 capsule (290 mcg total) by mouth daily before breakfast. 03/31/22  Yes Montez Morita, Daniel, MD  lisinopril (ZESTRIL) 5 MG tablet TAKE 1 TABLET ONCE DAILY. 01/02/22  Yes Brita Romp, NP  metFORMIN (GLUCOPHAGE) 1000 MG tablet Take 1 tablet (1,000 mg total) by mouth 2 (two) times daily with a meal. 05/19/22  Yes Reardon, Juanetta Beets, NP  methadone (DOLOPHINE) 10 MG/5ML solution Take 60 mg by mouth every morning.   Yes [provider]  methocarbamol (ROBAXIN) 500 MG tablet Take by mouth at bedtime. 05/11/22  Yes [provider]  Multiple Vitamin (MULTIVITAMIN) tablet Take 1 tablet by mouth daily.   Yes [provider]  naloxegol oxalate (MOVANTIK) 25 MG TABS tablet Take 1 tablet (25 mg total) by mouth daily. 05/18/22  Yes Harvel Quale, MD  nystatin (MYCOSTATIN/NYSTOP) powder  Apply 1 application topically 3 (three) times daily. Patient taking differently: Apply 1 application  topically 3 (three) times daily as needed (irritation). 05/19/21  Yes Mesner, Corene Cornea, MD  omeprazole (PRILOSEC) 40 MG capsule TAKE 1 CAPSULE BY MOUTH TWICE DAILY. 05/28/22  Yes Harvel Quale, MD  polyethylene glycol-electrolytes (TRILYTE) 420 g solution Take 4,000 mLs by mouth as directed. 05/18/22  Yes Harvel Quale, MD  potassium chloride (KLOR-CON M) 10 MEQ tablet Take 10 mEq by mouth 2 (two) times daily. 10/15/21  Yes [provider]  pregabalin (LYRICA) 75 MG capsule Take 75 mg by mouth in the morning, at noon, in the evening, and at bedtime.    Yes [provider]  PROAIR HFA 108 (90 Base) MCG/ACT inhaler Inhale 1-2 puffs into the lungs every 6 (six) hours as needed for wheezing or shortness of breath. 04/04/21  Yes [provider]  promethazine (PHENERGAN) 12.5 MG tablet Take 1 tablet (12.5 mg total) by mouth every 8 (eight) hours as needed for nausea or vomiting. 02/16/22  Yes Harvel Quale, MD  propranolol (INDERAL) 10 MG tablet Take 10 mg by mouth 3 (three) times daily. 08/08/20  Yes [provider]  RESTASIS 0.05 % ophthalmic emulsion Place 1 drop into both eyes as needed (dry eye).  04/22/19  Yes [provider]  rosuvastatin (CRESTOR) 5 MG tablet Take 1 tablet (5 mg total) by mouth daily. 08/25/21  Yes Brita Romp, NP  spironolactone (ALDACTONE) 100 MG tablet Take 100 mg by mouth daily.  05/22/20  Yes [provider]  sucralfate (CARAFATE) 1 GM/10ML suspension Take 10 mLs (1 g total) by mouth 4 (four) times daily -  with meals and at bedtime. 10/23/21  Yes Carlan, Chelsea L, NP  tirzepatide (MOUNJARO) 7.5 MG/0.5ML Pen Inject 7.5 mg into the skin once a week. 06/23/22  Yes Reardon, Juanetta Beets, NP  triamcinolone (KENALOG) 0.1 % Apply 1 application topically daily as needed (irritation). 02/04/21  Yes [provider]  venlafaxine XR (EFFEXOR-XR) 150 MG 24 hr capsule Take total of 225 mg daily (150 mg + 75 mg ) 01/24/21  Yes Hisada, Elie Goody, MD  venlafaxine XR (EFFEXOR-XR) 75 MG 24 hr capsule To take along with 149m for a total of 225 mg daily  01/20/21  Yes Hisada, Elie Goody, MD  Vitamin D, Ergocalciferol, (DRISDOL) 1.25 MG (50000 UNIT) CAPS capsule Take 1 capsule (50,000 Units total) by mouth every 7 (seven) days. Patient taking differently: Take 50,000 Units by mouth every Tuesday. 12/10/21  Yes Brita Romp, NP    Family History Family History  Problem Relation Age of Onset   Hypertension Mother    Hyperlipidemia Mother    Hyperlipidemia Maternal Grandfather     Social History Social History   Tobacco Use   Smoking status: Every Day    Types: E-cigarettes   Smokeless tobacco: Never  Vaping Use   Vaping Use: Every day   Substances: Nicotine, Flavoring  Substance Use Topics   Alcohol use: No   Drug use: No     Allergies   Iodine-131, Ivp dye [iodinated contrast media], Ketorolac tromethamine, Tylenol [acetaminophen], Vancomycin, Gabapentin, Nsaids, and Suboxone [buprenorphine hcl-naloxone hcl]   Review of Systems Review of Systems  Constitutional:  Positive for activity change, fatigue and fever. Negative for appetite change.  HENT:  Positive for congestion. Negative for sinus pressure, sneezing and sore throat.   Respiratory:  Positive for cough. Negative for shortness of breath.   Cardiovascular:  Negative for chest pain.  Gastrointestinal:  Negative for abdominal pain, diarrhea, nausea and vomiting.  Neurological:  Negative for dizziness, light-headedness and headaches.     Physical Exam Triage Vital Signs ED Triage Vitals  Enc Vitals Group     BP 07/12/22 1210 112/73     Pulse Rate 07/12/22 1210 87     Resp 07/12/22 1210 18     Temp 07/12/22 1210 98.1 F (36.7 C)     Temp Source 07/12/22 1210 Oral     SpO2 07/12/22 1210 91 %     Weight 07/12/22 1212 283 lb  (128.4 kg)     Height 07/12/22 1212 5' 10"  (1.778 m)     Head Circumference --      Peak Flow --      Pain Score 07/12/22 1212 0     Pain Loc --      Pain Edu? --      Excl. in [place]? --    No data found.  Updated Vital Signs BP 112/73 (BP Location: Right Arm)   Pulse 87   Temp 98.1 F (36.7 C) (Oral)   Resp 18   Ht 5' 10"  (1.778 m)   Wt 283 lb (128.4 kg)   SpO2 94%   BMI 40.61 kg/m   Visual Acuity Right Eye Distance:   Left Eye Distance:   Bilateral Distance:    Right Eye Near:   Left Eye Near:    Bilateral Near:     Physical Exam Vitals reviewed.  Constitutional:      General: She is awake. She is not in acute distress.    Appearance: Normal appearance. She is well-developed. She is not ill-appearing.     Comments: Very pleasant female appears stated age in no acute distress sitting comfortably in exam room  HENT:     Head: Normocephalic and atraumatic.     Right Ear: Tympanic membrane, ear canal and external ear normal. Tympanic membrane is not erythematous or bulging.     Left Ear: Tympanic membrane, ear canal and external ear normal. Tympanic membrane is not erythematous or bulging.     Nose:     Right Sinus: No maxillary sinus tenderness or frontal sinus tenderness.     Left Sinus: No maxillary sinus tenderness or  frontal sinus tenderness.     Mouth/Throat:     Tongue: No lesions. Tongue does not deviate from midline.     Pharynx: Uvula midline. No oropharyngeal exudate or posterior oropharyngeal erythema.     Comments: Normal-appearing posterior oropharynx.  No lesions or abnormalities noted of tongue. Cardiovascular:     Rate and Rhythm: Normal rate and regular rhythm.     Heart sounds: Normal heart sounds, S1 normal and S2 normal. No murmur heard. Pulmonary:     Effort: Pulmonary effort is normal.     Breath sounds: Normal breath sounds. No wheezing, rhonchi or rales.     Comments: Clear to auscultation bilaterally Psychiatric:        Behavior: Behavior  is cooperative.      UC Treatments / Results  Labs (all labs ordered are listed, but only abnormal results are displayed) Labs Reviewed  SARS CORONAVIRUS 2 (TAT 6-24 HRS)    EKG   Radiology No results found.  Procedures Procedures (including critical care time)  Medications Ordered in UC Medications - No data to display  Initial Impression / Assessment and Plan / UC Course  I have reviewed the triage vital signs and the nursing notes.  Pertinent labs & imaging results that were available during my care of the patient were reviewed by me and considered in my medical decision making (see chart for details).     Patient is well-appearing, afebrile, nontoxic, nontachycardic.  Initial oxygen saturation was slightly low but recheck was baseline for patient.  No evidence of acute infection on physical exam that warrant initiation of antibiotics.  Discussed concern for COVID given clinical presentation.  COVID testing was obtained today-results pending.  She is outside the window of effectiveness for Paxlovid or other antivirals.  Recommended conservative treatment measures.  Discussed that if her symptoms or not improving she should follow-up with her PCP to consider referral to specialist.  Discussed that if she has any worsening symptoms including shortness of breath, chest pain, fever, nausea/vomiting interfere with oral intake she needs to go to the emergency room immediately.  Strict return precautions given.  Final Clinical Impressions(s) / UC Diagnoses   Final diagnoses:  Upper respiratory tract infection, unspecified type  Acute cough  Dysgeusia     Discharge Instructions      Monitor your MyChart for your COVID results.  Use Tessalon for cough.  You can use over-the-counter medication as needed.  Make sure you are resting and drinking plenty of fluid.  If your symptoms are not improving quickly or if anything worsens please return for reevaluation.  Follow-up with your  PCP next week.     ED Prescriptions     Medication Sig Dispense Auth. Provider   benzonatate (TESSALON) 100 MG capsule Take 1 capsule (100 mg total) by mouth every 8 (eight) hours. 21 capsule Casin Federici K, PA-C      PDMP not reviewed this encounter.   Terrilee Croak, PA-C 07/12/22 1235

## 2022-07-12 NOTE — ED Triage Notes (Signed)
Patient requesting to be tested for COVID.  She has had no taste, cough, some fever x 1 week.  Denies any OTC meds and no COVID test done at home.

## 2022-07-13 LAB — SARS CORONAVIRUS 2 (TAT 6-24 HRS): SARS Coronavirus 2: NEGATIVE

## 2022-07-17 ENCOUNTER — Ambulatory Visit
Admission: EM | Admit: 2022-07-17 | Discharge: 2022-07-17 | Disposition: A | Discharge status: Home / Self Care | Payer: Medicaid Other | Attending: Nurse Practitioner | Admitting: Nurse Practitioner

## 2022-07-17 ENCOUNTER — Encounter: Payer: Self-pay | Admitting: Nurse Practitioner

## 2022-07-17 ENCOUNTER — Ambulatory Visit: Payer: Self-pay

## 2022-07-17 DIAGNOSIS — B349 Viral infection, unspecified: Secondary | ICD-10-CM | POA: Diagnosis not present

## 2022-07-17 DIAGNOSIS — R6889 Other general symptoms and signs: Secondary | ICD-10-CM | POA: Diagnosis not present

## 2022-07-17 DIAGNOSIS — U071 COVID-19: Secondary | ICD-10-CM | POA: Insufficient documentation

## 2022-07-17 DIAGNOSIS — R0989 Other specified symptoms and signs involving the circulatory and respiratory systems: Secondary | ICD-10-CM | POA: Diagnosis not present

## 2022-07-17 LAB — RESP PANEL BY RT-PCR (FLU A&B, COVID) ARPGX2
Influenza A by PCR: NEGATIVE
Influenza B by PCR: NEGATIVE
SARS Coronavirus 2 by RT PCR: POSITIVE — AB

## 2022-07-17 LAB — POCT RAPID STREP A (OFFICE): Rapid Strep A Screen: NEGATIVE

## 2022-07-17 MED ORDER — FLUTICASONE PROPIONATE 50 MCG/ACT NA SUSP: 2.0000 | Freq: Every day | NASAL | 0 refills | Status: DC

## 2022-07-17 MED ORDER — ALBUTEROL SULFATE HFA 108 (90 BASE) MCG/ACT IN AERS: 2.0000 | INHALATION_SPRAY | Freq: Four times a day (QID) | RESPIRATORY_TRACT | 0 refills | Status: AC | PRN

## 2022-07-17 MED ORDER — PROMETHAZINE-DM 6.25-15 MG/5ML PO SYRP: 5.0000 mL | ORAL_SOLUTION | Freq: Four times a day (QID) | ORAL | 0 refills | Status: DC | PRN

## 2022-07-17 NOTE — ED Triage Notes (Signed)
Pt  is present today with c/o cough, nasal congestion, body aches, and chills. Pt sx started x2 days ago

## 2022-07-17 NOTE — Discharge Instructions (Addendum)
Take medication as prescribed. Increase fluids and allow for plenty of rest. Recommend Tylenol or ibuprofen as needed for pain, fever, or general discomfort. Warm salt water gargles 3-4 times daily to help with throat pain or discomfort. Recommend using a humidifier at bedtime during sleep to help with cough and nasal congestion. Sleep elevated on 2 pillows while cough symptoms persist. Go to the emergency department for worsening shortness of breath, difficulty breathing or trouble breathing.  Follow-up with your PCP if symptoms fail to improve.

## 2022-07-17 NOTE — ED Provider Notes (Signed)
[facility]    CSN: [id number] Arrival date & time: 07/17/22  0820      History   Chief Complaint Chief Complaint  Jaime [name] presents with   Cough   Sore Throat    Loss of taste    HPI Jaime [name] is a 36 y.o. female.   The history is provided by the Jaime [name].   Jaime [name] presents with a 5-day history of the aches, nasal congestion, sore throat, cough, shortness of breath, and loss of taste.  Jaime [name] states that Jaime [name] has so had a fever around 101.  Jaime [name] denies headache, ear pain, wheezing, abdominal pain, nausea, vomiting, or diarrhea.  Jaime [name] states that Jaime [name] has been taking Tylenol for her symptoms.  Reports that Jaime [name] does not have any known sick contacts.  Jaime [name] has not received any COVID vaccines. Past Medical History:  Diagnosis Date   Anxiety    Asthma    Chronic abdominal pain    Chronic back pain    Cirrhosis (HCC)    Cirrhosis ([place])    COPD (chronic obstructive pulmonary disease) (HCC)    Depression    Diabetes mellitus without complication ([place])    Diabetes mellitus, type II ([place])    GERD (gastroesophageal reflux disease)    Hepatitis C    Hyperlipidemia    Insomnia    Long-term current use of methadone for opiate dependence ([place])    Lupus ([place])    Migraine headache    Neuropathy    Nocturnal seizures (HCC)    Peptic ulcer    Spleen enlarged     Jaime [name] Active Problem List   Diagnosis Date Noted   Constipation 05/18/2022   IBS (irritable bowel syndrome) 02/16/2022   Chronic prescription opiate use 02/16/2022   Abdominal pain 11/17/2021   Gastroesophageal reflux disease 10/23/2021   Nausea and vomiting 10/23/2021   Diarrhea 10/23/2021   Rectal bleeding 07/28/2021   Melena 07/28/2021   Pain of upper abdomen 07/28/2021   Non-intractable vomiting 07/28/2021   Adjustment disorder with depressed mood 09/07/2020   Respiratory failure ([place]) 08/31/2020   Acute encephalopathy 08/31/2020   Hyponatremia 08/16/2020   Volume overload 08/14/2020    Dyspnea 07/29/2020   Nexplanon insertion 05/20/2020   Nexplanon removal 05/20/2020   Pressure injury of skin 01/29/2019   Uncontrolled diabetes mellitus 09/19/2018   Hyperglycemia 09/17/2018   Acute respiratory failure with hypoxia ([place]) 09/15/2018   Anxiety 09/15/2018   Chronic abdominal pain 09/15/2018   Diabetes mellitus without complication ([place]) 00/86/7619   Hepatitis C 09/15/2018   Peptic ulcer 09/15/2018   Long-term current use of methadone for opiate dependence ([place]) 09/15/2018   Cirrhosis ([place]) 09/15/2018   Acute hypoxemic respiratory failure ([place]) 07/17/2016   Pleural effusion on right 07/17/2016   Multiple rib fractures involving four or more ribs 07/17/2016   Chronic pain disorder 07/17/2016    Past Surgical History:  Procedure Laterality Date   BIOPSY  11/25/2021   Procedure: BIOPSY;  Surgeon: Harvel Quale, MD;  [facility];  Service: Gastroenterology;;   CHOLECYSTECTOMY     COLONOSCOPY WITH PROPOFOL N/A 11/25/2021   Procedure: COLONOSCOPY WITH PROPOFOL;  Surgeon: Harvel Quale, MD;  [facility]: [facility];  Service: Gastroenterology;  Laterality: N/A;  805   COLONOSCOPY WITH PROPOFOL N/A 03/31/2022   Procedure: COLONOSCOPY WITH PROPOFOL;  Surgeon: Harvel Quale, MD;  [facility];  Service: Gastroenterology;  Laterality: N/A;  730   ESOPHAGOGASTRODUODENOSCOPY  06/2020   done at [facility],  candida in upper esophagus (treated with diflucan), ulcerative esophagitis at GE junction, gastritis in stomach, single ulcer in duodenal bulb, with duodenal mucosa showing no abnormality. No presence of varices   ESOPHAGOGASTRODUODENOSCOPY (EGD) WITH PROPOFOL N/A 11/25/2021   Procedure: ESOPHAGOGASTRODUODENOSCOPY (EGD) WITH PROPOFOL;  Surgeon: Harvel Quale, MD;  [facility];  Service: Gastroenterology;  Laterality: N/A;   ESOPHAGOGASTRODUODENOSCOPY (EGD) WITH PROPOFOL N/A 03/31/2022   Procedure:  ESOPHAGOGASTRODUODENOSCOPY (EGD) WITH PROPOFOL;  Surgeon: Harvel Quale, MD;  [facility];  Service: Gastroenterology;  Laterality: N/A;    OB History     Gravida  0   Para  0   Term  0   Preterm  0   AB  0   Living  0      SAB  0   IAB  0   Ectopic  0   Multiple  0   Live Births  0            Home Medications    Prior to Admission medications   Medication Sig Start Date End Date Taking? Authorizing Provider  albuterol (VENTOLIN HFA) 108 (90 Base) MCG/ACT inhaler Inhale 2 puffs into the lungs every 6 (six) hours as needed for wheezing or shortness of breath. 07/17/22  Yes Shavonte Zhao-Warren, Alda Lea, NP  fluticasone (FLONASE) 50 MCG/ACT nasal spray Place 2 sprays into both nostrils daily. 07/17/22  Yes Ermal Brzozowski-Warren, Alda Lea, NP  promethazine-dextromethorphan (PROMETHAZINE-DM) 6.25-15 MG/5ML syrup Take 5 mLs by mouth 4 (four) times daily as needed for cough. 07/17/22  Yes Anasha Perfecto-Warren, Alda Lea, NP  Accu-Chek Softclix Lancets lancets USE TO TEST BLOOD SUGAR 4 TIMES A DAY. 05/28/22   Brita Romp, NP  ARIPiprazole (ABILIFY) 20 MG tablet Take 20 mg by mouth daily. 10/15/21   [provider]  aspirin EC 81 MG tablet Take 81 mg by mouth daily. Swallow whole.    [provider]  benzonatate (TESSALON) 100 MG capsule Take 1 capsule (100 mg total) by mouth every 8 (eight) hours. 07/12/22   Raspet, Derry Skill, PA-C  blood glucose meter kit and supplies Dispense based on Jaime [name] and insurance preference. Use up to four times daily as directed. (FOR ICD-10 E10.9, E11.9). 11/12/21   Brita Romp, NP  cetirizine (ZYRTEC ALLERGY) 10 MG tablet Take 1 tablet (10 mg total) by mouth daily. 07/09/20   Avegno, Darrelyn Hillock, FNP  clonazePAM (KLONOPIN) 0.5 MG tablet Take 0.5 mg by mouth 2 (two) times daily.    [provider]  Continuous Blood Gluc Sensor (DEXCOM G6 SENSOR) MISC APPLY 1 SENSOR EVERY 10 DAYS 06/23/22   Brita Romp, NP   Continuous Blood Gluc Transmit (DEXCOM G6 TRANSMITTER) MISC USE AS DIRECTED. 06/23/22   Brita Romp, NP  cromolyn (OPTICROM) 4 % ophthalmic solution Place 1 drop into both eyes 2 (two) times daily. 01/28/21   [provider]  etonogestrel (NEXPLANON) 68 MG IMPL implant 1 each by Subdermal route once.    [provider]  furosemide (LASIX) 40 MG tablet Take 40 mg by mouth daily. 05/22/20   [provider]  glipiZIDE (GLUCOTROL XL) 5 MG 24 hr tablet Take 1 tablet (5 mg total) by mouth daily with breakfast. 06/23/22   Brita Romp, NP  glucose blood (ACCU-CHEK GUIDE) test strip Use as instructed to check blood glucose four times daily 12/01/21   Brita Romp, NP  hyoscyamine (ANASPAZ) 0.125 MG TBDP disintergrating tablet TAKE 1 TABLET UNDER THE  TONGUE THREE TIMES A DAY AS NEEDED FOR BLADDER SPASMS OR CRAMPING. 05/28/22   Montez Morita, Quillian Quince, MD  Insulin Pen Needle (GLOBAL EASE INJECT PEN NEEDLES) 31G X 8 MM MISC Use 1 pen needle 4 times daily for insulin. 11/10/21   Brita Romp, NP  Insulin Pen Needle (PEN NEEDLES) 32G X 6 MM MISC 1 each by Does not apply route 3 (three) times daily. 02/26/21   Brita Romp, NP  insulin regular human CONCENTRATED (HUMULIN R U-500 KWIKPEN) 500 UNIT/ML KwikPen Inject 90 Units into the skin 3 (three) times daily with meals. 06/23/22   Brita Romp, NP  linaclotide Rolan Lipa) 290 MCG CAPS capsule Take 1 capsule (290 mcg total) by mouth daily before breakfast. 03/31/22   Montez Morita, Quillian Quince, MD  lisinopril (ZESTRIL) 5 MG tablet TAKE 1 TABLET ONCE DAILY. 01/02/22   Brita Romp, NP  metFORMIN (GLUCOPHAGE) 1000 MG tablet Take 1 tablet (1,000 mg total) by mouth 2 (two) times daily with a meal. 05/19/22   Brita Romp, NP  methadone (DOLOPHINE) 10 MG/5ML solution Take 60 mg by mouth every morning.    [provider]  methocarbamol (ROBAXIN) 500 MG tablet Take by mouth at bedtime. 05/11/22    [provider]  Multiple Vitamin (MULTIVITAMIN) tablet Take 1 tablet by mouth daily.    [provider]  naloxegol oxalate (MOVANTIK) 25 MG TABS tablet Take 1 tablet (25 mg total) by mouth daily. 05/18/22   Harvel Quale, MD  nystatin (MYCOSTATIN/NYSTOP) powder Apply 1 application topically 3 (three) times daily. Jaime [name] taking differently: Apply 1 application  topically 3 (three) times daily as needed (irritation). 05/19/21   Mesner, Corene Cornea, MD  omeprazole (PRILOSEC) 40 MG capsule TAKE 1 CAPSULE BY MOUTH TWICE DAILY. 05/28/22   Harvel Quale, MD  polyethylene glycol-electrolytes (TRILYTE) 420 g solution Take 4,000 mLs by mouth as directed. 05/18/22   Harvel Quale, MD  potassium chloride (KLOR-CON M) 10 MEQ tablet Take 10 mEq by mouth 2 (two) times daily. 10/15/21   [provider]  pregabalin (LYRICA) 75 MG capsule Take 75 mg by mouth in the morning, at noon, in the evening, and at bedtime.     [provider]  promethazine (PHENERGAN) 12.5 MG tablet Take 1 tablet (12.5 mg total) by mouth every 8 (eight) hours as needed for nausea or vomiting. 02/16/22   Harvel Quale, MD  propranolol (INDERAL) 10 MG tablet Take 10 mg by mouth 3 (three) times daily. 08/08/20   [provider]  RESTASIS 0.05 % ophthalmic emulsion Place 1 drop into both eyes as needed (dry eye).  04/22/19   [provider]  rosuvastatin (CRESTOR) 5 MG tablet Take 1 tablet (5 mg total) by mouth daily. 08/25/21   Brita Romp, NP  spironolactone (ALDACTONE) 100 MG tablet Take 100 mg by mouth daily.  05/22/20   [provider]  sucralfate (CARAFATE) 1 GM/10ML suspension Take 10 mLs (1 g total) by mouth 4 (four) times daily -  with meals and at bedtime. 10/23/21   Carlan, Chelsea L, NP  tirzepatide [facility]) 7.5 MG/0.5ML Pen Inject 7.5 mg into the skin once a week. 06/23/22   Brita Romp, NP  triamcinolone (KENALOG) 0.1 %  Apply 1 application topically daily as needed (irritation). 02/04/21   [provider]  venlafaxine XR (EFFEXOR-XR) 150 MG 24 hr capsule Take total of 225 mg daily (150 mg + 75 mg ) 01/24/21   Hisada, Brunson,  MD  venlafaxine XR (EFFEXOR-XR) 75 MG 24 hr capsule To take along with 151m for a total of 225 mg daily 01/20/21   HNorman Clay MD  Vitamin D, Ergocalciferol, (DRISDOL) 1.25 MG (50000 UNIT) CAPS capsule Take 1 capsule (50,000 Units total) by mouth every 7 (seven) days. Jaime [name] taking differently: Take 50,000 Units by mouth every Tuesday. 12/10/21   RBrita Romp NP    Family History Family History  Problem Relation Age of Onset   Hypertension Mother    Hyperlipidemia Mother    Hyperlipidemia Maternal Grandfather     Social History Social History   Tobacco Use   Smoking status: Every Day    Types: E-cigarettes   Smokeless tobacco: Never  Vaping Use   Vaping Use: Every day   Substances: Nicotine, Flavoring  Substance Use Topics   Alcohol use: No   Drug use: No     Allergies   Iodine-131, Ivp dye [iodinated contrast media], Ketorolac tromethamine, Tylenol [acetaminophen], Vancomycin, Gabapentin, Nsaids, and Suboxone [buprenorphine hcl-naloxone hcl]   Review of Systems Review of Systems Per HPI  Physical Exam Triage Vital Signs ED Triage Vitals  Enc Vitals Group     BP 07/17/22 0917 108/69     Pulse Rate 07/17/22 0917 96     Resp 07/17/22 0916 18     Temp 07/17/22 0916 98.1 F (36.7 C)     Temp src --      SpO2 07/17/22 0917 94 %     Weight --      Height --      Head Circumference --      Peak Flow --      Pain Score 07/17/22 0918 8     Pain Loc --      Pain Edu? --      Excl. in [place] --    No data found.  Updated Vital Signs BP 108/69   Pulse 96   Temp 98.1 F (36.7 C)   Resp 18   SpO2 94%   Visual Acuity Right Eye Distance:   Left Eye Distance:   Bilateral Distance:    Right Eye Near:   Left Eye Near:    Bilateral Near:      Physical Exam Vitals and nursing note reviewed.  Constitutional:      General: Jaime [name] is not in acute distress.    Appearance: Normal appearance.  HENT:     Head: Normocephalic.     Right Ear: Tympanic membrane, ear canal and external ear normal.     Left Ear: Tympanic membrane, ear canal and external ear normal.     Nose: Congestion present.     Mouth/Throat:     Mouth: Mucous membranes are moist.     Pharynx: Posterior oropharyngeal erythema present. No oropharyngeal exudate.  Eyes:     Extraocular Movements: Extraocular movements intact.     Conjunctiva/sclera: Conjunctivae normal.     Pupils: Pupils are equal, round, and reactive to light.  Cardiovascular:     Rate and Rhythm: Normal rate and regular rhythm.     Pulses: Normal pulses.     Heart sounds: Normal heart sounds.  Pulmonary:     Effort: Pulmonary effort is normal. No respiratory distress.     Breath sounds: Normal breath sounds. No stridor. No wheezing, rhonchi or rales.  Abdominal:     General: Bowel sounds are normal.     Palpations: Abdomen is soft.     Tenderness: There is no abdominal tenderness.  Musculoskeletal:     Cervical back: Normal range of motion.  Lymphadenopathy:     Cervical: No cervical adenopathy.  Skin:    General: Skin is warm and dry.  Neurological:     General: No focal deficit present.     Mental Status: Jaime [name] is alert and oriented to person, place, and time.  Psychiatric:        Mood and Affect: Mood normal.        Behavior: Behavior normal.      UC Treatments / Results  Labs (all labs ordered are listed, but only abnormal results are displayed) Labs Reviewed  RESP PANEL BY RT-PCR (FLU A&B, COVID) [id number]  POCT RAPID STREP A (OFFICE)    EKG   Radiology No results found.  Procedures Procedures (including critical care time)  Medications Ordered in UC Medications - No data to display  Initial Impression / Assessment and Plan / UC Course  I have reviewed the triage  vital signs and the nursing notes.  Pertinent labs & imaging results that were available during my care of the Jaime [name] were reviewed by me and considered in my medical decision making (see chart for details).  Jaime [name] presents for complaints of generalized body aches, cough, nasal congestion, sore throat, and loss of taste.  Symptoms have been present over the past 48 hours.  On exam, Jaime [name]'s vital signs are stable, Jaime [name] is in no acute distress.  COVID/flu test is pending at this time.  Rapid strep test was negative, throat culture is also pending.  Based on the Jaime [name]'s presentation, symptoms are consistent with an upper respiratory infection.  Difficult to determine the etiology at this time; however, that will be based on the pending test.  Jaime [name] was prescribed Promethazine DM, albuterol, and fluticasone for symptomatic treatment.  Supportive care recommendations were provided to the Jaime [name].  Jaime [name] does have recent lab work dated 05/18/2022, with a normal creatinine.  Jaime [name] is a candidate to receive Paxlovid.  Strict ER indications were provided along with indications regarding follow-up in this clinic.  Jaime [name] verbalizes understanding.  All questions were answered. Final Clinical Impressions(s) / UC Diagnoses   Final diagnoses:  Flu-like symptoms  Upper respiratory symptom  Viral illness     Discharge Instructions      Take medication as prescribed. Increase fluids and allow for plenty of rest. Recommend Tylenol or ibuprofen as needed for pain, fever, or general discomfort. Warm salt water gargles 3-4 times daily to help with throat pain or discomfort. Recommend using a humidifier at bedtime during sleep to help with cough and nasal congestion. Sleep elevated on 2 pillows while cough symptoms persist. Go to the emergency department for worsening shortness of breath, difficulty breathing or trouble breathing.  Follow-up with your PCP if symptoms fail to improve.     ED  Prescriptions     Medication Sig Dispense Auth. Provider   promethazine-dextromethorphan (PROMETHAZINE-DM) 6.25-15 MG/5ML syrup Take 5 mLs by mouth 4 (four) times daily as needed for cough. 140 mL Ranen Doolin-Warren, Alda Lea, NP   fluticasone (FLONASE) 50 MCG/ACT nasal spray Place 2 sprays into both nostrils daily. 16 g Kennedi Lizardo-Warren, Alda Lea, NP   albuterol (VENTOLIN HFA) 108 (90 Base) MCG/ACT inhaler Inhale 2 puffs into the lungs every 6 (six) hours as needed for wheezing or shortness of breath. 8 g Ted Leonhart-Warren, Alda Lea, NP      PDMP not reviewed this encounter.   Tish Men, NP 07/17/22 1041

## 2022-07-19 ENCOUNTER — Emergency Department (HOSPITAL_COMMUNITY)
Admission: EM | Admit: 2022-07-19 | Discharge: 2022-07-19 | Disposition: A | Discharge status: Home / Self Care | Payer: Medicaid Other | Attending: Emergency Medicine | Admitting: Emergency Medicine

## 2022-07-19 ENCOUNTER — Encounter (HOSPITAL_COMMUNITY): Payer: Self-pay

## 2022-07-19 ENCOUNTER — Emergency Department (HOSPITAL_COMMUNITY): Payer: Medicaid Other

## 2022-07-19 ENCOUNTER — Other Ambulatory Visit: Payer: Self-pay

## 2022-07-19 DIAGNOSIS — F17290 Nicotine dependence, other tobacco product, uncomplicated: Secondary | ICD-10-CM | POA: Insufficient documentation

## 2022-07-19 DIAGNOSIS — J45909 Unspecified asthma, uncomplicated: Secondary | ICD-10-CM | POA: Insufficient documentation

## 2022-07-19 DIAGNOSIS — U071 COVID-19: Secondary | ICD-10-CM | POA: Diagnosis not present

## 2022-07-19 DIAGNOSIS — Z79899 Other long term (current) drug therapy: Secondary | ICD-10-CM | POA: Insufficient documentation

## 2022-07-19 DIAGNOSIS — E119 Type 2 diabetes mellitus without complications: Secondary | ICD-10-CM | POA: Diagnosis not present

## 2022-07-19 DIAGNOSIS — Z7982 Long term (current) use of aspirin: Secondary | ICD-10-CM | POA: Insufficient documentation

## 2022-07-19 DIAGNOSIS — R0981 Nasal congestion: Secondary | ICD-10-CM | POA: Diagnosis present

## 2022-07-19 DIAGNOSIS — Z9049 Acquired absence of other specified parts of digestive tract: Secondary | ICD-10-CM | POA: Diagnosis not present

## 2022-07-19 DIAGNOSIS — J040 Acute laryngitis: Secondary | ICD-10-CM

## 2022-07-19 DIAGNOSIS — Z794 Long term (current) use of insulin: Secondary | ICD-10-CM | POA: Diagnosis not present

## 2022-07-19 DIAGNOSIS — Z7984 Long term (current) use of oral hypoglycemic drugs: Secondary | ICD-10-CM | POA: Insufficient documentation

## 2022-07-19 MED ORDER — MOLNUPIRAVIR EUA 200MG CAPSULE: 4.0000 | ORAL_CAPSULE | Freq: Two times a day (BID) | ORAL | 0 refills | Status: AC

## 2022-07-19 MED ORDER — MENTHOL 3 MG MT LOZG: 1.0000 | LOZENGE | OROMUCOSAL | 0 refills | Status: DC | PRN

## 2022-07-19 MED ORDER — LIDOCAINE VISCOUS HCL 2 % MT SOLN
15.0000 mL | Freq: Once | OROMUCOSAL | Status: AC
  Administered 2022-07-19: 15 mL via OROMUCOSAL
  Filled 2022-07-19: qty 15

## 2022-07-19 NOTE — ED Notes (Signed)
X-ray at bedside

## 2022-07-19 NOTE — ED Triage Notes (Addendum)
Pt arrived from home with c/o sob and cough that started 2 days ago. Says that she has sob on exertion. Pt was recently seen at urgent care w same complaint and was given albuterol with little relief.

## 2022-07-19 NOTE — ED Provider Notes (Signed)
[facility] EMERGENCY DEPARTMENT Provider Note   CSN: [id number] Arrival date & time: 07/19/22  0746     History  Chief Complaint  Patient presents with   Shortness of Breath    Jaime [name] is a 36 y.o. female.  Patient as above with significant medical history as below, including asthma, chronic pain, depression, DM, HLD, opiate dependence/methadone use, cirrhosis, vaping use who presents to the ED with complaint of cough, congestion, sore throat, body aches.  Onset 3 days ago.  No sick contacts.  Having intermittent fevers and chills, subjective.  She was seen urgent care, started on albuterol which did not significant improve her symptoms.  COVID test resulted back which was positive.  Tolerant p.o. intake.  No change to bowel or bladder function from her baseline.  She continues to vape.  She has mild dyspnea with exertion.  Body aches.  No vomiting.  No chest pain.     Past Medical History:  Diagnosis Date   Anxiety    Asthma    Chronic abdominal pain    Chronic back pain    Cirrhosis ([place])    Cirrhosis ([place])    COPD (chronic obstructive pulmonary disease) ([place])    Depression    Diabetes mellitus without complication ([place])    Diabetes mellitus, type II ([place])    GERD (gastroesophageal reflux disease)    Hepatitis C    Hyperlipidemia    Insomnia    Long-term current use of methadone for opiate dependence ([place])    Lupus ([place])    Migraine headache    Neuropathy    Nocturnal seizures ([place])    Peptic ulcer    Spleen enlarged     Past Surgical History:  Procedure Laterality Date   BIOPSY  11/25/2021   Procedure: BIOPSY;  Surgeon: Harvel Quale, MD;  Location: [facility];  Service: Gastroenterology;;   CHOLECYSTECTOMY     COLONOSCOPY WITH PROPOFOL N/A 11/25/2021   Procedure: COLONOSCOPY WITH PROPOFOL;  Surgeon: Harvel Quale, MD;  Location: [facility];  Service: Gastroenterology;  Laterality: N/A;  805   COLONOSCOPY WITH PROPOFOL N/A  03/31/2022   Procedure: COLONOSCOPY WITH PROPOFOL;  Surgeon: Harvel Quale, MD;  Location: [facility];  Service: Gastroenterology;  Laterality: N/A;  730   ESOPHAGOGASTRODUODENOSCOPY  06/2020   done at [facility], candida in upper esophagus (treated with diflucan), ulcerative esophagitis at GE junction, gastritis in stomach, single ulcer in duodenal bulb, with duodenal mucosa showing no abnormality. No presence of varices   ESOPHAGOGASTRODUODENOSCOPY (EGD) WITH PROPOFOL N/A 11/25/2021   Procedure: ESOPHAGOGASTRODUODENOSCOPY (EGD) WITH PROPOFOL;  Surgeon: Harvel Quale, MD;  Location: [facility];  Service: Gastroenterology;  Laterality: N/A;   ESOPHAGOGASTRODUODENOSCOPY (EGD) WITH PROPOFOL N/A 03/31/2022   Procedure: ESOPHAGOGASTRODUODENOSCOPY (EGD) WITH PROPOFOL;  Surgeon: Harvel Quale, MD;  Location: [facility];  Service: Gastroenterology;  Laterality: N/A;     The history is provided by the patient. No language interpreter was used.  Shortness of Breath Associated symptoms: cough, fever and sore throat   Associated symptoms: no abdominal pain, no chest pain, no headaches and no rash        Home Medications Prior to Admission medications   Medication Sig Start Date End Date Taking? Authorizing Provider  menthol-cetylpyridinium (CEPACOL) 3 MG lozenge Take 1 lozenge (3 mg total) by mouth as needed for sore throat. 07/19/22  Yes Jeanell Sparrow, DO  molnupiravir EUA (LAGEVRIO) 200 mg CAPS capsule Take 4 capsules (800  mg total) by mouth 2 (two) times daily for 5 days. 07/19/22 07/24/22 Yes Wynona Dove A, DO  Accu-Chek Softclix Lancets lancets USE TO TEST BLOOD SUGAR 4 TIMES A DAY. 05/28/22   Brita Romp, NP  albuterol (VENTOLIN HFA) 108 (90 Base) MCG/ACT inhaler Inhale 2 puffs into the lungs every 6 (six) hours as needed for wheezing or shortness of breath. 07/17/22   Leath-Warren, Alda Lea, NP  ARIPiprazole (ABILIFY) 20 MG tablet Take 20 mg by mouth  daily. 10/15/21   [provider]  aspirin EC 81 MG tablet Take 81 mg by mouth daily. Swallow whole.    [provider]  benzonatate (TESSALON) 100 MG capsule Take 1 capsule (100 mg total) by mouth every 8 (eight) hours. 07/12/22   Raspet, Derry Skill, PA-C  blood glucose meter kit and supplies Dispense based on patient and insurance preference. Use up to four times daily as directed. (FOR ICD-10 E10.9, E11.9). 11/12/21   Brita Romp, NP  cetirizine (ZYRTEC ALLERGY) 10 MG tablet Take 1 tablet (10 mg total) by mouth daily. 07/09/20   Avegno, Darrelyn Hillock, FNP  clonazePAM (KLONOPIN) 0.5 MG tablet Take 0.5 mg by mouth 2 (two) times daily.    [provider]  Continuous Blood Gluc Sensor (DEXCOM G6 SENSOR) MISC APPLY 1 SENSOR EVERY 10 DAYS 06/23/22   Brita Romp, NP  Continuous Blood Gluc Transmit (DEXCOM G6 TRANSMITTER) MISC USE AS DIRECTED. 06/23/22   Brita Romp, NP  cromolyn (OPTICROM) 4 % ophthalmic solution Place 1 drop into both eyes 2 (two) times daily. 01/28/21   [provider]  etonogestrel (NEXPLANON) 68 MG IMPL implant 1 each by Subdermal route once.    [provider]  fluticasone (FLONASE) 50 MCG/ACT nasal spray Place 2 sprays into both nostrils daily. 07/17/22   Leath-Warren, Alda Lea, NP  furosemide (LASIX) 40 MG tablet Take 40 mg by mouth daily. 05/22/20   [provider]  glipiZIDE (GLUCOTROL XL) 5 MG 24 hr tablet Take 1 tablet (5 mg total) by mouth daily with breakfast. 06/23/22   Brita Romp, NP  glucose blood (ACCU-CHEK GUIDE) test strip Use as instructed to check blood glucose four times daily 12/01/21   Brita Romp, NP  hyoscyamine (ANASPAZ) 0.125 MG TBDP disintergrating tablet TAKE 1 TABLET UNDER THE TONGUE THREE TIMES A DAY AS NEEDED FOR BLADDER SPASMS OR CRAMPING. 05/28/22   Montez Morita, Quillian Quince, MD  Insulin Pen Needle (GLOBAL EASE INJECT PEN NEEDLES) 31G X 8 MM MISC Use 1 pen needle 4 times daily for  insulin. 11/10/21   Brita Romp, NP  Insulin Pen Needle (PEN NEEDLES) 32G X 6 MM MISC 1 each by Does not apply route 3 (three) times daily. 02/26/21   Brita Romp, NP  insulin regular human CONCENTRATED (HUMULIN R U-500 KWIKPEN) 500 UNIT/ML KwikPen Inject 90 Units into the skin 3 (three) times daily with meals. 06/23/22   Brita Romp, NP  linaclotide Rolan Lipa) 290 MCG CAPS capsule Take 1 capsule (290 mcg total) by mouth daily before breakfast. 03/31/22   Montez Morita, Quillian Quince, MD  lisinopril (ZESTRIL) 5 MG tablet TAKE 1 TABLET ONCE DAILY. 01/02/22   Brita Romp, NP  metFORMIN (GLUCOPHAGE) 1000 MG tablet Take 1 tablet (1,000 mg total) by mouth 2 (two) times daily with a meal. 05/19/22   Brita Romp, NP  methadone (DOLOPHINE) 10 MG/5ML solution Take 60 mg by mouth every morning.    [provider]  methocarbamol (ROBAXIN) 500 MG tablet Take by mouth at bedtime. 05/11/22   [provider]  Multiple Vitamin (MULTIVITAMIN) tablet Take 1 tablet by mouth daily.    [provider]  naloxegol oxalate (MOVANTIK) 25 MG TABS tablet Take 1 tablet (25 mg total) by mouth daily. 05/18/22   Harvel Quale, MD  nystatin (MYCOSTATIN/NYSTOP) powder Apply 1 application topically 3 (three) times daily. Patient taking differently: Apply 1 application  topically 3 (three) times daily as needed (irritation). 05/19/21   Mesner, Corene Cornea, MD  omeprazole (PRILOSEC) 40 MG capsule TAKE 1 CAPSULE BY MOUTH TWICE DAILY. 05/28/22   Harvel Quale, MD  polyethylene glycol-electrolytes (TRILYTE) 420 g solution Take 4,000 mLs by mouth as directed. 05/18/22   Harvel Quale, MD  potassium chloride (KLOR-CON M) 10 MEQ tablet Take 10 mEq by mouth 2 (two) times daily. 10/15/21   [provider]  pregabalin (LYRICA) 75 MG capsule Take 75 mg by mouth in the morning, at noon, in the evening, and at bedtime.     [provider]  promethazine  (PHENERGAN) 12.5 MG tablet Take 1 tablet (12.5 mg total) by mouth every 8 (eight) hours as needed for nausea or vomiting. 02/16/22   Harvel Quale, MD  promethazine-dextromethorphan (PROMETHAZINE-DM) 6.25-15 MG/5ML syrup Take 5 mLs by mouth 4 (four) times daily as needed for cough. 07/17/22   Leath-Warren, Alda Lea, NP  propranolol (INDERAL) 10 MG tablet Take 10 mg by mouth 3 (three) times daily. 08/08/20   [provider]  RESTASIS 0.05 % ophthalmic emulsion Place 1 drop into both eyes as needed (dry eye).  04/22/19   [provider]  rosuvastatin (CRESTOR) 5 MG tablet Take 1 tablet (5 mg total) by mouth daily. 08/25/21   Brita Romp, NP  spironolactone (ALDACTONE) 100 MG tablet Take 100 mg by mouth daily.  05/22/20   [provider]  sucralfate (CARAFATE) 1 GM/10ML suspension Take 10 mLs (1 g total) by mouth 4 (four) times daily -  with meals and at bedtime. 10/23/21   Carlan, Chelsea L, NP  tirzepatide [facility]) 7.5 MG/0.5ML Pen Inject 7.5 mg into the skin once a week. 06/23/22   Brita Romp, NP  triamcinolone (KENALOG) 0.1 % Apply 1 application topically daily as needed (irritation). 02/04/21   [provider]  venlafaxine XR (EFFEXOR-XR) 150 MG 24 hr capsule Take total of 225 mg daily (150 mg + 75 mg ) 01/24/21   Norman Clay, MD  venlafaxine XR (EFFEXOR-XR) 75 MG 24 hr capsule To take along with 14m for a total of 225 mg daily 01/20/21   HNorman Clay MD  Vitamin D, Ergocalciferol, (DRISDOL) 1.25 MG (50000 UNIT) CAPS capsule Take 1 capsule (50,000 Units total) by mouth every 7 (seven) days. Patient taking differently: Take 50,000 Units by mouth every Tuesday. 12/10/21   RBrita Romp NP      Allergies    Iodine-131, Ivp dye [iodinated contrast media], Ketorolac tromethamine, Tylenol [acetaminophen], Vancomycin, Gabapentin, Nsaids, and Suboxone [buprenorphine hcl-naloxone hcl]    Review of Systems   Review of Systems   Constitutional:  Positive for chills and fever. Negative for activity change.  HENT:  Positive for congestion and sore throat. Negative for facial swelling and trouble swallowing.   Eyes:  Negative for discharge and redness.  Respiratory:  Positive for cough and shortness of breath.   Cardiovascular:  Negative for chest pain and palpitations.  Gastrointestinal:  Negative for abdominal pain and nausea.  Genitourinary:  Negative for dysuria and flank pain.  Musculoskeletal:  Positive for arthralgias and myalgias. Negative for back pain and gait problem.  Skin:  Negative for pallor and rash.  Neurological:  Negative for syncope and headaches.    Physical Exam Updated Vital Signs BP 128/78   Pulse 93   Temp 98.1 F (36.7 C) (Oral)   Resp 19   Ht _0  (1.778 m)   Wt 127 kg   SpO2 96%   BMI 40.18 kg/m  Physical Exam Vitals and nursing note reviewed.  Constitutional:      General: She is not in acute distress.    Appearance: Normal appearance. She is well-developed. She is obese. She is not ill-appearing.  HENT:     Head: Normocephalic and atraumatic.     Right Ear: External ear normal.     Left Ear: External ear normal.     Nose: Nose normal.     Mouth/Throat:     Mouth: Mucous membranes are moist.     Pharynx: Oropharynx is clear. Uvula midline.     Tonsils: No tonsillar abscesses.     Comments: Uvula is midline, no drooling trismus or stridor Eyes:     General: No scleral icterus. Cardiovascular:     Rate and Rhythm: Normal rate and regular rhythm.     Heart sounds: Normal heart sounds.  Pulmonary:     Effort: Pulmonary effort is normal. No tachypnea or respiratory distress.     Breath sounds: Normal breath sounds. No stridor. No wheezing.  Abdominal:     General: Abdomen is flat.     Palpations: Abdomen is soft.     Tenderness: There is no abdominal tenderness.  Musculoskeletal:        General: Normal range of motion.     Cervical back: Normal range of motion.      Right lower leg: No edema.     Left lower leg: No edema.  Skin:    General: Skin is warm and dry.     Capillary Refill: Capillary refill takes less than 2 seconds.  Neurological:     Mental Status: She is alert and oriented to person, place, and time.     GCS: GCS eye subscore is 4. GCS verbal subscore is 5. GCS motor subscore is 6.  Psychiatric:        Mood and Affect: Mood normal.        Behavior: Behavior normal.     ED Results / Procedures / Treatments   Labs (all labs ordered are listed, but only abnormal results are displayed) Labs Reviewed - No data to display  EKG EKG Interpretation  Date/Time:  Sunday July 19 2022 08:04:59 EDT Ventricular Rate:  92 PR Interval:  165 QRS Duration: 85 QT Interval:  370 QTC Calculation: 458 R Axis:   36 Text Interpretation: Sinus rhythm Baseline wander in lead(s) V1 no stemi Confirmed by Wynona Dove ([id number]) on 07/19/2022 8:54:07 AM  Radiology DG Chest Port 1 View  Result Date: 07/19/2022 CLINICAL DATA:  Chest pain, shortness of breath, COVID positive EXAM: PORTABLE CHEST 1 VIEW COMPARISON:  Previous studies including the examination of 09/04/2020 FINDINGS: Transverse diameter of heart is increased. There is poor inspiration. There are no signs of pulmonary edema or focal pulmonary consolidation. Left lateral CP angle is indistinct. Right lateral CP angle is clear. There is no pneumothorax. IMPRESSION: Cardiomegaly. Poor inspiration. Left lateral CP angle is indistinct which may be due to pleural thickening or small effusion. There  are no signs of pulmonary edema or focal pulmonary consolidation. Electronically Signed   By: Elmer Picker M.D.   On: 07/19/2022 08:59    Procedures Procedures    Medications Ordered in ED Medications - No data to display  ED Course/ Medical Decision Making/ A&P                           Medical Decision Making Amount and/or Complexity of Data Reviewed Radiology: ordered.  Risk OTC  drugs.   This patient presents to the ED with chief complaint(s) of dyspnea, body aches, sore throat, myalgias with pertinent past medical history of above which further complicates the presenting complaint. The complaint involves an extensive differential diagnosis and also carries with it a high risk of complications and morbidity.     In my evaluation of this patient's dyspnea my DDx includes, but is not limited to, pneumonia, pulmonary embolism, pneumothorax, pulmonary edema, metabolic acidosis, asthma, COPD, cardiac cause, anemia, anxiety, etc.  . Serious etiologies were considered.   The initial plan is to obtain chest x-ray, reviewed COVID test which is positive   Additional history obtained: Additional history obtained from  not applicable Records reviewed Primary Care Documents  Independent labs interpretation:  The following labs were independently interpreted: not Applicable  Independent visualization of imaging: - I independently visualized the following imaging with scope of interpretation limited to determining acute life threatening conditions related to emergency care: c x-ray, which revealed cardiomegaly, otherwise stable  EKG without notable acute ischemia  Treatment and Reassessment: Symptoms unchanged  Consultation: - Consulted or discussed management/test interpretation w/ external professional: na  Consideration for admission or further workup: Admission was considered  ' 36 yo female with URI symptoms, found to be covid +, she has multiple comorbidities, renal function is stable.  CXR is WNL. ECG is WNL, she is not hypoxic. She is ambulatory and tolerating PO intake w/o difficulty. We will start patient on molnupiravir for COVID-19.  Also discussed supportive care at home.  Isolation precautions.  Strict Return precautions.  The patient improved significantly and was discharged in stable condition. Detailed discussions were had with the patient regarding  current findings, and need for close f/u with PCP or on call doctor. The patient has been instructed to return immediately if the symptoms worsen in any way for re-evaluation. Patient verbalized understanding and is in agreement with current care plan. All questions answered prior to discharge.    Social Determinants of health: Counseled patient for approximately 3.5  minutes regarding smoking cessation. Discussed risks of smoking and how they applied and affected their visit here today. Patient not ready to quit at this time, however will follow up with their primary doctor when they are.   CPT code: [phone]: intermediate counseling for smoking cessation   Social History   Tobacco Use   Smoking status: Every Day    Types: E-cigarettes   Smokeless tobacco: Never  Vaping Use   Vaping Use: Every day   Substances: Nicotine, Flavoring  Substance Use Topics   Alcohol use: No   Drug use: No            Final Clinical Impression(s) / ED Diagnoses Final diagnoses:  COVID-19  Laryngitis    Rx / DC Orders ED Discharge Orders          Ordered    menthol-cetylpyridinium (CEPACOL) 3 MG lozenge  As needed        07/19/22 0858  molnupiravir EUA (LAGEVRIO) 200 mg CAPS capsule  2 times daily        07/19/22 0858              Jeanell Sparrow, DO 07/19/22 [phone]

## 2022-07-19 NOTE — Discharge Instructions (Addendum)
It was a pleasure caring for you today in the emergency department.  Please return to the emergency department for any worsening or worrisome symptoms.  Please wear a mask, consider obtaining covid 19 vaccine in the future once feeling better

## 2022-07-20 ENCOUNTER — Telehealth (HOSPITAL_COMMUNITY): Payer: Self-pay | Admitting: Emergency Medicine

## 2022-07-20 MED ORDER — NIRMATRELVIR/RITONAVIR (PAXLOVID)TABLET: 3.0000 | ORAL_TABLET | Freq: Two times a day (BID) | ORAL | 0 refills | Status: AC

## 2022-08-09 ENCOUNTER — Emergency Department (HOSPITAL_COMMUNITY)
Admission: EM | Admit: 2022-08-09 | Discharge: 2022-08-09 | Disposition: A | Discharge status: Home / Self Care | Payer: Medicaid Other | Attending: Emergency Medicine | Admitting: Emergency Medicine

## 2022-08-09 ENCOUNTER — Other Ambulatory Visit: Payer: Self-pay

## 2022-08-09 ENCOUNTER — Emergency Department (HOSPITAL_COMMUNITY): Payer: Medicaid Other

## 2022-08-09 ENCOUNTER — Encounter (HOSPITAL_COMMUNITY): Payer: Self-pay | Admitting: Emergency Medicine

## 2022-08-09 DIAGNOSIS — Z7982 Long term (current) use of aspirin: Secondary | ICD-10-CM | POA: Insufficient documentation

## 2022-08-09 DIAGNOSIS — Z794 Long term (current) use of insulin: Secondary | ICD-10-CM | POA: Insufficient documentation

## 2022-08-09 DIAGNOSIS — E119 Type 2 diabetes mellitus without complications: Secondary | ICD-10-CM | POA: Insufficient documentation

## 2022-08-09 DIAGNOSIS — Z7984 Long term (current) use of oral hypoglycemic drugs: Secondary | ICD-10-CM | POA: Insufficient documentation

## 2022-08-09 DIAGNOSIS — Z79899 Other long term (current) drug therapy: Secondary | ICD-10-CM | POA: Diagnosis not present

## 2022-08-09 DIAGNOSIS — N3090 Cystitis, unspecified without hematuria: Secondary | ICD-10-CM | POA: Diagnosis not present

## 2022-08-09 DIAGNOSIS — R1084 Generalized abdominal pain: Secondary | ICD-10-CM | POA: Diagnosis present

## 2022-08-09 DIAGNOSIS — R112 Nausea with vomiting, unspecified: Secondary | ICD-10-CM | POA: Diagnosis not present

## 2022-08-09 LAB — COMPREHENSIVE METABOLIC PANEL
ALT: 25 U/L (ref 0–44)
AST: 39 U/L (ref 15–41)
Albumin: 2.5 g/dL — ABNORMAL LOW (ref 3.5–5.0)
Alkaline Phosphatase: 122 U/L (ref 38–126)
Anion gap: 7 (ref 5–15)
BUN: 5 mg/dL — ABNORMAL LOW (ref 6–20)
CO2: 26 mmol/L (ref 22–32)
Calcium: 8.3 mg/dL — ABNORMAL LOW (ref 8.9–10.3)
Chloride: 103 mmol/L (ref 98–111)
Creatinine, Ser: 0.53 mg/dL (ref 0.44–1.00)
GFR, Estimated: >60 mL/min (ref >60)
Glucose, Bld: 257 mg/dL — ABNORMAL HIGH (ref 70–99)
Potassium: 4 mmol/L (ref 3.5–5.1)
Sodium: 136 mmol/L (ref 135–145)
Total Bilirubin: 2.7 mg/dL — ABNORMAL HIGH (ref 0.3–1.2)
Total Protein: 6.6 g/dL (ref 6.5–8.1)

## 2022-08-09 LAB — CBC WITH DIFFERENTIAL/PLATELET
Abs Immature Granulocytes: 0.05 10*3/uL (ref 0.00–0.07)
Basophils Absolute: 0 10*3/uL (ref 0.0–0.1)
Basophils Relative: 0 %
Eosinophils Absolute: 0.1 10*3/uL (ref 0.0–0.5)
Eosinophils Relative: 3 %
HCT: 37.3 % (ref 36.0–46.0)
Hemoglobin: 12.6 g/dL (ref 12.0–15.0)
Immature Granulocytes: 1 %
Lymphocytes Relative: 14 %
Lymphs Abs: 0.7 10*3/uL (ref 0.7–4.0)
MCH: 30.6 pg (ref 26.0–34.0)
MCHC: 33.8 g/dL (ref 30.0–36.0)
MCV: 90.5 fL (ref 80.0–100.0)
Monocytes Absolute: 0.4 10*3/uL (ref 0.1–1.0)
Monocytes Relative: 9 %
Neutro Abs: 3.6 10*3/uL (ref 1.7–7.7)
Neutrophils Relative %: 73 %
Platelets: 72 10*3/uL — ABNORMAL LOW (ref 150–400)
RBC: 4.12 MIL/uL (ref 3.87–5.11)
RDW: 15.4 % (ref 11.5–15.5)
WBC: 4.9 10*3/uL (ref 4.0–10.5)
nRBC: 0 % (ref 0.0–0.2)

## 2022-08-09 LAB — URINALYSIS, ROUTINE W REFLEX MICROSCOPIC
Bilirubin Urine: NEGATIVE
Glucose, UA: NEGATIVE mg/dL
Hgb urine dipstick: NEGATIVE
Ketones, ur: NEGATIVE mg/dL
Nitrite: POSITIVE — AB
Protein, ur: 100 mg/dL — AB
Specific Gravity, Urine: 1.014 (ref 1.005–1.030)
WBC, UA: >50 WBC/hpf — ABNORMAL HIGH (ref 0–5)
pH: 6 (ref 5.0–8.0)

## 2022-08-09 LAB — LIPASE, BLOOD: Lipase: 36 U/L (ref 11–51)

## 2022-08-09 LAB — PREGNANCY, URINE: Preg Test, Ur: NEGATIVE

## 2022-08-09 MED ORDER — FENTANYL CITRATE PF 50 MCG/ML IJ SOSY
50.0000 ug | PREFILLED_SYRINGE | Freq: Once | INTRAMUSCULAR | Status: AC
  Administered 2022-08-09: 50 ug via INTRAVENOUS
  Filled 2022-08-09: qty 1

## 2022-08-09 MED ORDER — CEPHALEXIN 500 MG PO CAPS: 500.0000 mg | ORAL_CAPSULE | Freq: Four times a day (QID) | ORAL | 0 refills | Status: DC

## 2022-08-09 MED ORDER — ONDANSETRON HCL 4 MG/2ML IJ SOLN
4.0000 mg | Freq: Once | INTRAMUSCULAR | Status: AC
  Administered 2022-08-09: 4 mg via INTRAVENOUS
  Filled 2022-08-09: qty 2

## 2022-08-09 MED ORDER — SODIUM CHLORIDE 0.9 % IV SOLN
1.0000 g | Freq: Once | INTRAVENOUS | Status: AC
  Administered 2022-08-09: 1 g via INTRAVENOUS
  Filled 2022-08-09: qty 10

## 2022-08-09 NOTE — Discharge Instructions (Signed)
Your work-up today shows that you have a urinary tract infection.  You have been prescribed antibiotics to take for this.  Please take the medication as directed until finished.  Drink plenty of water.  Follow-up with your primary care provider in 1 week after you have completed the antibiotics to have your urine rechecked.  Return to the emergency department if you develop any new or worsening symptoms.

## 2022-08-09 NOTE — ED Triage Notes (Signed)
Pt to ER with c/o mid abdominal pain starting yesterday.  Pt states n/v, denies diarrhea or urinary symptoms.

## 2022-08-12 LAB — URINE CULTURE: Culture: >=100000 — AB

## 2022-08-12 NOTE — ED Provider Notes (Signed)
[facility] EMERGENCY DEPARTMENT Provider Note   CSN: [id number] Arrival date & time: 08/09/22  3762     History  Chief Complaint  Patient presents with   Abdominal Pain    Yuliza Cara is a 36 y.o. female.   Abdominal Pain Associated symptoms: nausea and vomiting   Associated symptoms: no chest pain, no chills, no constipation, no cough, no diarrhea, no dysuria, no fever, no shortness of breath, no vaginal bleeding and no vaginal discharge         Marionna Gonia is a 36 y.o. female with past medical history of cirrhosis, chronic abdominal pain, type 2 diabetes, and peptic ulcer disease.  she presents to the Emergency Department complaining of diffuse mid abdominal pain x1 day.  Patient describes pain as cramping in quality and associated with intermittent nausea medicine.  She denies any constipation, diarrhea, flank pain, or dysuria.  Vaginal bleeding or discharge.  States she has been tolerating some liquids.  Makes the pain better or worse.   Home Medications Prior to Admission medications   Medication Sig Start Date End Date Taking? Authorizing Provider  cephALEXin (KEFLEX) 500 MG capsule Take 1 capsule (500 mg total) by mouth 4 (four) times daily. 08/09/22  Yes Jatavious Peppard, PA-C  Accu-Chek Softclix Lancets lancets USE TO TEST BLOOD SUGAR 4 TIMES A DAY. 05/28/22   Brita Romp, NP  albuterol (VENTOLIN HFA) 108 (90 Base) MCG/ACT inhaler Inhale 2 puffs into the lungs every 6 (six) hours as needed for wheezing or shortness of breath. 07/17/22   Leath-Warren, Alda Lea, NP  ARIPiprazole (ABILIFY) 20 MG tablet Take 20 mg by mouth daily. 10/15/21   [provider]  aspirin EC 81 MG tablet Take 81 mg by mouth daily. Swallow whole.    [provider]  benzonatate (TESSALON) 100 MG capsule Take 1 capsule (100 mg total) by mouth every 8 (eight) hours. 07/12/22   Raspet, Derry Skill, PA-C  blood glucose meter kit and supplies Dispense based on patient and  insurance preference. Use up to four times daily as directed. (FOR ICD-10 E10.9, E11.9). 11/12/21   Brita Romp, NP  cetirizine (ZYRTEC ALLERGY) 10 MG tablet Take 1 tablet (10 mg total) by mouth daily. 07/09/20   Avegno, Darrelyn Hillock, FNP  clonazePAM (KLONOPIN) 0.5 MG tablet Take 0.5 mg by mouth 2 (two) times daily.    [provider]  Continuous Blood Gluc Sensor (DEXCOM G6 SENSOR) MISC APPLY 1 SENSOR EVERY 10 DAYS 06/23/22   Brita Romp, NP  Continuous Blood Gluc Transmit (DEXCOM G6 TRANSMITTER) MISC USE AS DIRECTED. 06/23/22   Brita Romp, NP  cromolyn (OPTICROM) 4 % ophthalmic solution Place 1 drop into both eyes 2 (two) times daily. 01/28/21   [provider]  etonogestrel (NEXPLANON) 68 MG IMPL implant 1 each by Subdermal route once.    [provider]  fluticasone (FLONASE) 50 MCG/ACT nasal spray Place 2 sprays into both nostrils daily. 07/17/22   Leath-Warren, Alda Lea, NP  furosemide (LASIX) 40 MG tablet Take 40 mg by mouth daily. 05/22/20   [provider]  glipiZIDE (GLUCOTROL XL) 5 MG 24 hr tablet Take 1 tablet (5 mg total) by mouth daily with breakfast. 06/23/22   Brita Romp, NP  glucose blood (ACCU-CHEK GUIDE) test strip Use as instructed to check blood glucose four times daily 12/01/21   Brita Romp, NP  hyoscyamine (ANASPAZ) 0.125 MG TBDP disintergrating tablet TAKE 1 TABLET UNDER THE TONGUE THREE TIMES  A DAY AS NEEDED FOR BLADDER SPASMS OR CRAMPING. 05/28/22   Montez Morita, Quillian Quince, MD  Insulin Pen Needle (GLOBAL EASE INJECT PEN NEEDLES) 31G X 8 MM MISC Use 1 pen needle 4 times daily for insulin. 11/10/21   Brita Romp, NP  Insulin Pen Needle (PEN NEEDLES) 32G X 6 MM MISC 1 each by Does not apply route 3 (three) times daily. 02/26/21   Brita Romp, NP  insulin regular human CONCENTRATED (HUMULIN R U-500 KWIKPEN) 500 UNIT/ML KwikPen Inject 90 Units into the skin 3 (three) times daily with meals. 06/23/22    Brita Romp, NP  linaclotide Rolan Lipa) 290 MCG CAPS capsule Take 1 capsule (290 mcg total) by mouth daily before breakfast. 03/31/22   Montez Morita, Quillian Quince, MD  lisinopril (ZESTRIL) 5 MG tablet TAKE 1 TABLET ONCE DAILY. 01/02/22   Brita Romp, NP  menthol-cetylpyridinium (CEPACOL) 3 MG lozenge Take 1 lozenge (3 mg total) by mouth as needed for sore throat. 07/19/22   Jeanell Sparrow, DO  metFORMIN (GLUCOPHAGE) 1000 MG tablet Take 1 tablet (1,000 mg total) by mouth 2 (two) times daily with a meal. 05/19/22   Brita Romp, NP  methadone (DOLOPHINE) 10 MG/5ML solution Take 60 mg by mouth every morning.    [provider]  methocarbamol (ROBAXIN) 500 MG tablet Take by mouth at bedtime. 05/11/22   [provider]  Multiple Vitamin (MULTIVITAMIN) tablet Take 1 tablet by mouth daily.    [provider]  naloxegol oxalate (MOVANTIK) 25 MG TABS tablet Take 1 tablet (25 mg total) by mouth daily. 05/18/22   Harvel Quale, MD  nystatin (MYCOSTATIN/NYSTOP) powder Apply 1 application topically 3 (three) times daily. Patient taking differently: Apply 1 application  topically 3 (three) times daily as needed (irritation). 05/19/21   Mesner, Corene Cornea, MD  omeprazole (PRILOSEC) 40 MG capsule TAKE 1 CAPSULE BY MOUTH TWICE DAILY. 05/28/22   Harvel Quale, MD  polyethylene glycol-electrolytes (TRILYTE) 420 g solution Take 4,000 mLs by mouth as directed. 05/18/22   Harvel Quale, MD  potassium chloride (KLOR-CON M) 10 MEQ tablet Take 10 mEq by mouth 2 (two) times daily. 10/15/21   [provider]  pregabalin (LYRICA) 75 MG capsule Take 75 mg by mouth in the morning, at noon, in the evening, and at bedtime.     [provider]  promethazine (PHENERGAN) 12.5 MG tablet Take 1 tablet (12.5 mg total) by mouth every 8 (eight) hours as needed for nausea or vomiting. 02/16/22   Harvel Quale, MD  promethazine-dextromethorphan  (PROMETHAZINE-DM) 6.25-15 MG/5ML syrup Take 5 mLs by mouth 4 (four) times daily as needed for cough. 07/17/22   Leath-Warren, Alda Lea, NP  propranolol (INDERAL) 10 MG tablet Take 10 mg by mouth 3 (three) times daily. 08/08/20   [provider]  RESTASIS 0.05 % ophthalmic emulsion Place 1 drop into both eyes as needed (dry eye).  04/22/19   [provider]  rosuvastatin (CRESTOR) 5 MG tablet Take 1 tablet (5 mg total) by mouth daily. 08/25/21   Brita Romp, NP  spironolactone (ALDACTONE) 100 MG tablet Take 100 mg by mouth daily.  05/22/20   [provider]  sucralfate (CARAFATE) 1 GM/10ML suspension Take 10 mLs (1 g total) by mouth 4 (four) times daily -  with meals and at bedtime. 10/23/21   Carlan, Chelsea L, NP  tirzepatide [facility]) 7.5 MG/0.5ML Pen Inject 7.5 mg into the skin once a week. 06/23/22  Brita Romp, NP  triamcinolone (KENALOG) 0.1 % Apply 1 application topically daily as needed (irritation). 02/04/21   [provider]  venlafaxine XR (EFFEXOR-XR) 150 MG 24 hr capsule Take total of 225 mg daily (150 mg + 75 mg ) 01/24/21   Norman Clay, MD  venlafaxine XR (EFFEXOR-XR) 75 MG 24 hr capsule To take along with 190m for a total of 225 mg daily 01/20/21   HNorman Clay MD  Vitamin D, Ergocalciferol, (DRISDOL) 1.25 MG (50000 UNIT) CAPS capsule Take 1 capsule (50,000 Units total) by mouth every 7 (seven) days. Patient taking differently: Take 50,000 Units by mouth every Tuesday. 12/10/21   RBrita Romp NP      Allergies    Iodine-131, Ivp dye [iodinated contrast media], Ketorolac tromethamine, Tylenol [acetaminophen], Vancomycin, Gabapentin, Nsaids, and Suboxone [buprenorphine hcl-naloxone hcl]    Review of Systems   Review of Systems  Constitutional:  Negative for appetite change, chills and fever.  Respiratory:  Negative for cough and shortness of breath.   Cardiovascular:  Negative for chest pain.  Gastrointestinal:  Positive for  abdominal pain, nausea and vomiting. Negative for constipation and diarrhea.  Genitourinary:  Negative for decreased urine volume, dysuria, flank pain, vaginal bleeding and vaginal discharge.  Neurological:  Negative for weakness and numbness.    Physical Exam Updated Vital Signs BP 139/83   Pulse (!) 118   Temp 98.3 F (36.8 C) (Oral)   Resp 16   Ht _0  (1.778 m)   Wt 127 kg   SpO2 93%   BMI 40.18 kg/m  Physical Exam Vitals and nursing note reviewed.  Constitutional:      General: She is not in acute distress.    Appearance: She is well-developed. She is obese. She is not ill-appearing or toxic-appearing.  Cardiovascular:     Rate and Rhythm: Normal rate and regular rhythm.  Pulmonary:     Effort: Pulmonary effort is normal. No respiratory distress.  Chest:     Chest wall: No tenderness.  Abdominal:     Palpations: Abdomen is soft.     Tenderness: There is generalized abdominal tenderness. There is no right CVA tenderness or left CVA tenderness.     Comments: Diffuse mid to lower abdominal pain to palpation.  No guarding or rebound.  Abdomen is soft.  Musculoskeletal:        General: Normal range of motion.  Skin:    General: Skin is warm.  Neurological:     General: No focal deficit present.     Mental Status: She is alert.     ED Results / Procedures / Treatments   Labs (all labs ordered are listed, but only abnormal results are displayed) Labs Reviewed  URINE CULTURE - Abnormal; Notable for the following components:      Result Value   Culture >=100,000 COLONIES/mL ESCHERICHIA COLI (*)    Organism ID, Bacteria ESCHERICHIA COLI (*)    All other components within normal limits  CBC WITH DIFFERENTIAL/PLATELET - Abnormal; Notable for the following components:   Platelets 72 (*)    All other components within normal limits  COMPREHENSIVE METABOLIC PANEL - Abnormal; Notable for the following components:   Glucose, Bld 257 (*)    BUN 5 (*)    Calcium 8.3 (*)     Albumin 2.5 (*)    Total Bilirubin 2.7 (*)    All other components within normal limits  URINALYSIS, ROUTINE W REFLEX MICROSCOPIC - Abnormal; Notable for the following  components:   Color, Urine AMBER (*)    APPearance CLOUDY (*)    Protein, ur 100 (*)    Nitrite POSITIVE (*)    Leukocytes,Ua MODERATE (*)    WBC, UA >50 (*)    Bacteria, UA MANY (*)    All other components within normal limits  LIPASE, BLOOD  PREGNANCY, URINE    EKG None  Radiology CT ABDOMEN PELVIS WO CONTRAST  Result Date: 08/09/2022 CLINICAL DATA:  Abdominal pain and nausea/vomiting, denies diarrhea or urinary symptoms. History of cirrhosis EXAM: CT ABDOMEN AND PELVIS WITHOUT CONTRAST TECHNIQUE: Multidetector CT imaging of the abdomen and pelvis was performed following the standard protocol without IV contrast. RADIATION DOSE REDUCTION: This exam was performed according to the departmental dose-optimization program which includes automated exposure control, adjustment of the mA and/or kV according to patient size and/or use of iterative reconstruction technique. COMPARISON:  CT abdomen and pelvis November 08, 2021 and August 11, 2021 FINDINGS: Lower chest: Bibasilar dependent atelectasis. Resolved right lower lobe consolidation seen in January 2023. Normal heart size no pericardial effusion. Hepatobiliary: Hepatomegaly and nodular liver contour, consistent with history of cirrhosis. The gallbladder is surgically absent. No biliary ductal dilation. Pancreas: Fatty atrophy of the pancreas. No pancreatic ductal dilation or surrounding inflammatory changes. Spleen: Splenomegaly with splenorenal varices. No focal abnormalities. Adrenals/Urinary Tract: Adrenal glands are unremarkable. No hydronephrosis or nephrolithiasis. Circumferential bladder wall thickening, likely due to decompression. No surrounding inflammatory changes. Stomach/Bowel: Small paraesophageal varices. Stomach is within normal limits. No evidence of bowel wall  thickening, distention, or inflammatory changes. Vascular/Lymphatic: Normal course and caliber of the abdominal aorta and its branches. The portosystemic collaterals are not well assessed in the absence of intravenous contrast. No enlarged abdominal or pelvic lymph nodes. Reproductive: Uterus and bilateral adnexa are unremarkable. Other: No abdominal wall hernia or abnormality. Mesenteric root edema, similar to prior. Musculoskeletal: Chronic right seventh and eighth rib fractures. No acute osseous abnormalities. IMPRESSION: 1. No acute etiology for abdominal pain identified. 2. Sequela of cirrhosis with portal hypertension, similar to prior. 3. Bibasilar atelectasis. Resolved right lower lobe consolidation seen in January 2023. Electronically Signed   By: Beryle Flock M.D.   On: 08/09/2022 11:44     Procedures Procedures    Medications Ordered in ED Medications  fentaNYL (SUBLIMAZE) injection 50 mcg (50 mcg Intravenous Given 08/09/22 0955)  ondansetron (ZOFRAN) injection 4 mg (4 mg Intravenous Given 08/09/22 0954)  cefTRIAXone (ROCEPHIN) 1 g in sodium chloride 0.9 % 100 mL IVPB (0 g Intravenous Stopped 08/09/22 1338)    ED Course/ Medical Decision Making/ A&P                           Medical Decision Making Patient here for 1 day evaluation of abdominal pain with intermittent nausea and vomiting.  Tolerating small amounts of fluids.  Denies any flank pain dysuria, abnormal vaginal bleeding or discharge.  On exam, diffuse mid to lower abdominal pain without peritoneal signs.  Mucous membranes are moist.  No CVA tenderness.  Differential diagnosis would include but not limited to acute appendicitis acute diverticulitis, gastroenteritis, cystitis, pregnancy, ectopic pregnancy, TOA.  Given patient's vague symptoms and minimal tenderness on abdominal exam, my clinical suspicion for acute abdomen is low.  Plan to check urinalysis, labs, and patient will require abdominal imaging for further  evaluation.  Amount and/or Complexity of Data Reviewed Labs: ordered.    Details: Labs interpreted by me, no evidence of leukocytosis,  hemoglobin reassuring.  Urine pregnancy test is negative.  Chemistries show blood sugar of 257 with normal anion gap.  Total bilirubin elevated at 2.7.  LFTs unremarkable.  Bilirubin mildly elevated 9 months ago .kidney functions unremarkable.  Lipase unremarkable.  Urinalysis shows cloudy urine, positive nitrites and moderate leukocytes with greater than 50 white cells and many bacteria.  Urine culture is pending. Radiology: ordered.    Details: CT abdomen and pelvis shows no acute allergy for patient's symptoms.  Sequela of cirrhosis with portal hypertension similar to previous.  Patient has surgically absent gallbladder and no biliary ductal dilatation Discussion of management or test interpretation with external provider(s): Patient here with diffuse mid to lower abdominal pain associated with intermittent nausea and vomiting.  No fever or CVA tenderness.  There is no right upper quadrant tenderness on her abdominal exam.  There is no guarding or rebound tenderness.  Her total bilirubin is elevated today but her LFTs are normal and CT imaging does not show biliary ductal dilatation and her gallbladder is surgically absent.  She has history of cirrhosis.  I feel that patient's symptoms today are related to cystitis.  No fever or CVA tenderness to suggest pyelonephritis although this could be developing.  No indications of sepsis.  She is tolerating oral fluids.  Reports feeling better after receiving medications here.  Patient states that she is ready to leave.  She has been given IV Rocephin here and urine culture is pending.  I feel that she is appropriate for discharge home, agreeable to oral antibiotics and prompt ER return if her symptoms worsen.  Risk Prescription drug management.           Final Clinical Impression(s) / ED Diagnoses Final diagnoses:   Generalized abdominal pain  Cystitis    Rx / DC Orders ED Discharge Orders          Ordered    cephALEXin (KEFLEX) 500 MG capsule  4 times daily        08/09/22 1325              Keaston Pile, [place], PA-C 87/86/76 7209    Lianne Cure, DO 47/09/62 1831

## 2022-08-13 ENCOUNTER — Telehealth (HOSPITAL_BASED_OUTPATIENT_CLINIC_OR_DEPARTMENT_OTHER): Payer: Self-pay | Admitting: *Deleted

## 2022-08-13 NOTE — Telephone Encounter (Signed)
Post ED Visit - Positive Culture Follow-up  Culture report reviewed by antimicrobial stewardship pharmacist: [place] Team []  Elenor Quinones, Pharm.D. []  Heide Guile, Pharm.D., BCPS AQ-ID []  Parks Neptune, Pharm.D., BCPS []  Alycia Rossetti, Pharm.D., BCPS []  [place], Pharm.D., BCPS, AAHIVP []  Legrand Como, Pharm.D., BCPS, AAHIVP []  Salome Arnt, PharmD, BCPS []  Johnnette Gourd, PharmD, BCPS []  Hughes Better, PharmD, BCPS []  Leeroy Cha, PharmD []  Laqueta Linden, PharmD, BCPS []  Albertina Parr, PharmD  [place] Team []  Leodis Sias, PharmD []  Lindell Spar, PharmD []  Royetta Asal, PharmD []  Graylin Shiver, Rph []  Rema Fendt) Glennon Mac, PharmD []  Arlyn Dunning, PharmD []  Netta Cedars, PharmD []  Dia Sitter, PharmD []  Leone Haven, PharmD []  Gretta Arab, PharmD []  Theodis Shove, PharmD []  Peggyann Juba, PharmD []  Reuel Boom, PharmD   Positive urine culture Treated with Cephalexin, organism sensitive to the same and no further patient follow-up is required at this time.  Louanne Belton, Pharm D  Harlon Flor Talley 08/13/2022, 10:29 AM

## 2022-08-14 ENCOUNTER — Telehealth (INDEPENDENT_AMBULATORY_CARE_PROVIDER_SITE_OTHER): Payer: Self-pay | Admitting: *Deleted

## 2022-08-14 NOTE — Telephone Encounter (Signed)
PA required for movantik. I faxed request to [facility] on 08/13/22. I called [facility] today to see if med would go through and it still is not going through. Med still pending with [employer].

## 2022-08-19 NOTE — Telephone Encounter (Signed)
Tried calling [facility] at [phone] Reference # I [id number] was sent to the pharmacy waited on line for a while and someone picked up and never answered.

## 2022-08-19 NOTE — Telephone Encounter (Signed)
Prior Auth still pending per [place]

## 2022-08-20 NOTE — Telephone Encounter (Signed)
Thanks for the update

## 2022-08-20 NOTE — Telephone Encounter (Signed)
We received a decision from Medicaid that they are not going to cover Movantik 25 mg. Denial on Dr. Jenetta Downer desk.

## 2022-09-02 ENCOUNTER — Telehealth (INDEPENDENT_AMBULATORY_CARE_PROVIDER_SITE_OTHER): Payer: Self-pay | Admitting: Gastroenterology

## 2022-09-02 DIAGNOSIS — K5904 Chronic idiopathic constipation: Secondary | ICD-10-CM

## 2022-09-02 MED ORDER — LUBIPROSTONE 24 MCG PO CAPS: 24.0000 ug | ORAL_CAPSULE | Freq: Two times a day (BID) | ORAL | 3 refills | Status: DC

## 2022-09-02 NOTE — Telephone Encounter (Signed)
Patient made aware of all.

## 2022-09-02 NOTE — Telephone Encounter (Signed)
I received a letter from [facility] stating that [place] was not approved unless she fell 2 of the preferred agents.  She is currently on Linzess which has not relieved her symptoms.  I will send a prescription for Amitiza (this is the second agent preferred by Medicaid), she should stop taking Linzess.  If these agents do not provide relief of her symptoms, we will resend a prescription for Movantik. Please inform the patient about this.

## 2022-09-02 NOTE — Telephone Encounter (Signed)
Patient aware the med sent to [facility].

## 2022-09-07 ENCOUNTER — Telehealth: Payer: Self-pay

## 2022-09-07 ENCOUNTER — Other Ambulatory Visit: Payer: Self-pay | Admitting: Nurse Practitioner

## 2022-09-07 MED ORDER — DEXCOM G7 SENSOR MISC: 1.0000 | 3 refills | Status: DC

## 2022-09-07 NOTE — Telephone Encounter (Signed)
Called pt to let her know that Loree Fee has sent in the [place] but it may or may not be approved. She did not answer

## 2022-09-16 ENCOUNTER — Other Ambulatory Visit: Payer: Self-pay | Admitting: Nurse Practitioner

## 2022-09-16 ENCOUNTER — Other Ambulatory Visit (INDEPENDENT_AMBULATORY_CARE_PROVIDER_SITE_OTHER): Payer: Self-pay | Admitting: Gastroenterology

## 2022-09-16 DIAGNOSIS — R1084 Generalized abdominal pain: Secondary | ICD-10-CM

## 2022-09-29 ENCOUNTER — Ambulatory Visit (INDEPENDENT_AMBULATORY_CARE_PROVIDER_SITE_OTHER): Payer: Medicaid Other | Admitting: Nurse Practitioner

## 2022-09-29 ENCOUNTER — Encounter: Payer: Self-pay | Admitting: Nurse Practitioner

## 2022-09-29 VITALS — BP 100/61 | HR 83 | Ht 70.0 in | Wt 273.6 lb

## 2022-09-29 DIAGNOSIS — Z91119 Patient's noncompliance with dietary regimen due to unspecified reason: Secondary | ICD-10-CM

## 2022-09-29 DIAGNOSIS — E1165 Type 2 diabetes mellitus with hyperglycemia: Secondary | ICD-10-CM | POA: Diagnosis not present

## 2022-09-29 DIAGNOSIS — E782 Mixed hyperlipidemia: Secondary | ICD-10-CM

## 2022-09-29 LAB — POCT GLYCOSYLATED HEMOGLOBIN (HGB A1C): Hemoglobin A1C: 11.1 % — AB (ref 4.0–5.6)

## 2022-09-29 MED ORDER — DEXCOM G7 SENSOR MISC: 1.0000 | 3 refills | Status: DC

## 2022-09-29 MED ORDER — HUMULIN R U-500 KWIKPEN 500 UNIT/ML ~~LOC~~ SOPN: 90.0000 [IU] | PEN_INJECTOR | Freq: Three times a day (TID) | SUBCUTANEOUS | 2 refills | Status: DC

## 2022-09-29 MED ORDER — MOUNJARO 7.5 MG/0.5ML ~~LOC~~ SOAJ: 7.5000 mg | SUBCUTANEOUS | 3 refills | Status: DC

## 2022-09-29 MED ORDER — METFORMIN HCL 1000 MG PO TABS: 1000.0000 mg | ORAL_TABLET | Freq: Two times a day (BID) | ORAL | 3 refills | Status: DC

## 2022-09-29 MED ORDER — GLIPIZIDE ER 5 MG PO TB24: 5.0000 mg | ORAL_TABLET | Freq: Every day | ORAL | 1 refills | Status: DC

## 2022-09-29 NOTE — Progress Notes (Signed)
09/29/2022, 9:55 AM     Endocrinology Follow Up Visit  Subjective:    Patient ID: Jaime [name], female    DOB: 1986/01/18.  Lindsi Bayliss is being seen in follow up for management of currently uncontrolled symptomatic diabetes requested by  Neale Burly, MD.   Past Medical History:  Diagnosis Date   Anxiety    Asthma    Chronic abdominal pain    Chronic back pain    Cirrhosis ([place])    Cirrhosis ([place])    COPD (chronic obstructive pulmonary disease) ([place])    Depression    Diabetes mellitus without complication ([place])    Diabetes mellitus, type II ([place])    GERD (gastroesophageal reflux disease)    Hepatitis C    Hyperlipidemia    Insomnia    Long-term current use of methadone for opiate dependence ([place])    Lupus ([place])    Migraine headache    Neuropathy    Nocturnal seizures ([place])    Peptic ulcer    Spleen enlarged     Past Surgical History:  Procedure Laterality Date   BIOPSY  11/25/2021   Procedure: BIOPSY;  Surgeon: Harvel Quale, MD;  [facility];  Service: Gastroenterology;;   CHOLECYSTECTOMY     COLONOSCOPY WITH PROPOFOL N/A 11/25/2021   Procedure: COLONOSCOPY WITH PROPOFOL;  Surgeon: Harvel Quale, MD;  Location[facility];  Service: Gastroenterology;  Laterality: N/A;  805   COLONOSCOPY WITH PROPOFOL N/A 03/31/2022   Procedure: COLONOSCOPY WITH PROPOFOL;  Surgeon: Harvel Quale, MD;  [facility];  Service: Gastroenterology;  Laterality: N/A;  730   ESOPHAGOGASTRODUODENOSCOPY  06/2020   done at [facility], candida in upper esophagus (treated with diflucan), ulcerative esophagitis at GE junction, gastritis in stomach, single ulcer in duodenal bulb, with duodenal mucosa showing no abnormality. No presence of varices   ESOPHAGOGASTRODUODENOSCOPY (EGD) WITH PROPOFOL N/A 11/25/2021   Procedure: ESOPHAGOGASTRODUODENOSCOPY (EGD)  WITH PROPOFOL;  Surgeon: Harvel Quale, MD;  [facility];  Service: Gastroenterology;  Laterality: N/A;   ESOPHAGOGASTRODUODENOSCOPY (EGD) WITH PROPOFOL N/A 03/31/2022   Procedure: ESOPHAGOGASTRODUODENOSCOPY (EGD) WITH PROPOFOL;  Surgeon: Harvel Quale, MD;  [facility];  Service: Gastroenterology;  Laterality: N/A;    Social History   Socioeconomic History   Marital status: Single    Spouse name: Not on file   Number of children: Not on file   Years of education: Not on file   Highest education level: Not on file  Occupational History   Not on file  Tobacco Use   Smoking status: Every Day    Types: E-cigarettes   Smokeless tobacco: Never  Vaping Use   Vaping Use: Every day   Substances: Nicotine, Flavoring  Substance and Sexual Activity   Alcohol use: No   Drug use: No   Sexual activity: Not Currently    Birth control/protection: Implant  Other Topics Concern   Not on file  Social History Narrative   Not on file   Social Determinants of Health   Financial Resource Strain: Medium Risk (05/09/2020)   Overall Financial Resource  Strain (CARDIA)    Difficulty of Paying Living Expenses: Somewhat hard  Food Insecurity: No Food Insecurity (05/09/2020)   Hunger Vital Sign    Worried About Running Out of Food in the Last Year: Never true    Ran Out of Food in the Last Year: Never true  Transportation Needs: No Transportation Needs (05/09/2020)   PRAPARE - Hydrologist (Medical): No    Lack of Transportation (Non-Medical): No  Physical Activity: Insufficiently Active (05/09/2020)   Exercise Vital Sign    Days of Exercise per Week: 1 day    Minutes of Exercise per Session: 20 min  Stress: Stress Concern Present (05/09/2020)   [place]    Feeling of Stress : Very much  Social Connections: Moderately Isolated (05/09/2020)   Social Connection and  Isolation Panel [NHANES]    Frequency of Communication with Friends and Family: More than three times a week    Frequency of Social Gatherings with Friends and Family: Twice a week    Attends Religious Services: More than 4 times per year    Active Member of [employer] or Organizations: No    Attends Archivist Meetings: Never    Marital Status: Never married    Family History  Problem Relation Age of Onset   Hypertension Mother    Hyperlipidemia Mother    Hyperlipidemia Maternal Grandfather     Outpatient Encounter Medications as of 09/29/2022  Medication Sig   Accu-Chek Softclix Lancets lancets USE TO TEST BLOOD SUGAR 4 TIMES A DAY.   albuterol (VENTOLIN HFA) 108 (90 Base) MCG/ACT inhaler Inhale 2 puffs into the lungs every 6 (six) hours as needed for wheezing or shortness of breath.   ARIPiprazole (ABILIFY) 20 MG tablet Take 20 mg by mouth daily.   aspirin EC 81 MG tablet Take 81 mg by mouth daily. Swallow whole.   benzonatate (TESSALON) 100 MG capsule Take 1 capsule (100 mg total) by mouth every 8 (eight) hours.   blood glucose meter kit and supplies Dispense based on patient and insurance preference. Use up to four times daily as directed. (FOR ICD-10 E10.9, E11.9).   cephALEXin (KEFLEX) 500 MG capsule Take 1 capsule (500 mg total) by mouth 4 (four) times daily.   cetirizine (ZYRTEC ALLERGY) 10 MG tablet Take 1 tablet (10 mg total) by mouth daily.   clonazePAM (KLONOPIN) 0.5 MG tablet Take 0.5 mg by mouth 2 (two) times daily.   Continuous Blood Gluc Sensor (DEXCOM G6 SENSOR) MISC APPLY 1 SENSOR EVERY 10 DAYS.   Continuous Blood Gluc Sensor (DEXCOM G7 SENSOR) MISC Inject 1 Application into the skin as directed. Change sensor every 10 days as directed.   Continuous Blood Gluc Transmit (DEXCOM G6 TRANSMITTER) MISC USE AS DIRECTED.   cromolyn (OPTICROM) 4 % ophthalmic solution Place 1 drop into both eyes 2 (two) times daily.   etonogestrel (NEXPLANON) 68 MG IMPL implant 1 each by  Subdermal route once.   fluticasone (FLONASE) 50 MCG/ACT nasal spray Place 2 sprays into both nostrils daily.   furosemide (LASIX) 40 MG tablet Take 40 mg by mouth daily.   glucose blood (ACCU-CHEK GUIDE) test strip Use as instructed to check blood glucose four times daily   hyoscyamine (ANASPAZ) 0.125 MG TBDP disintergrating tablet TAKE 1 TABLET UNDER THE TONGUE THREE TIMES A DAY AS NEEDED FOR BLADDER SPASMS OR CRAMPING.   Insulin Pen Needle (GLOBAL EASE INJECT PEN NEEDLES) 31G X 8  MM MISC Use 1 pen needle 4 times daily for insulin.   Insulin Pen Needle (PEN NEEDLES) 32G X 6 MM MISC 1 each by Does not apply route 3 (three) times daily.   lisinopril (ZESTRIL) 5 MG tablet TAKE 1 TABLET ONCE DAILY.   lubiprostone (AMITIZA) 24 MCG capsule Take 1 capsule (24 mcg total) by mouth 2 (two) times daily with a meal.   menthol-cetylpyridinium (CEPACOL) 3 MG lozenge Take 1 lozenge (3 mg total) by mouth as needed for sore throat.   methadone (DOLOPHINE) 10 MG/5ML solution Take 60 mg by mouth every morning.   methocarbamol (ROBAXIN) 500 MG tablet Take by mouth at bedtime.   Multiple Vitamin (MULTIVITAMIN) tablet Take 1 tablet by mouth daily.   naloxegol oxalate (MOVANTIK) 25 MG TABS tablet Take 1 tablet (25 mg total) by mouth daily.   nystatin (MYCOSTATIN/NYSTOP) powder Apply 1 application topically 3 (three) times daily. (Patient taking differently: Apply 1 application  topically 3 (three) times daily as needed (irritation).)   omeprazole (PRILOSEC) 40 MG capsule TAKE 1 CAPSULE BY MOUTH TWICE DAILY.   polyethylene glycol-electrolytes (TRILYTE) 420 g solution Take 4,000 mLs by mouth as directed.   potassium chloride (KLOR-CON M) 10 MEQ tablet Take 10 mEq by mouth 2 (two) times daily.   pregabalin (LYRICA) 75 MG capsule Take 75 mg by mouth in the morning, at noon, in the evening, and at bedtime.    promethazine (PHENERGAN) 12.5 MG tablet Take 1 tablet (12.5 mg total) by mouth every 8 (eight) hours as needed for  nausea or vomiting.   promethazine-dextromethorphan (PROMETHAZINE-DM) 6.25-15 MG/5ML syrup Take 5 mLs by mouth 4 (four) times daily as needed for cough.   propranolol (INDERAL) 10 MG tablet Take 10 mg by mouth 3 (three) times daily.   RESTASIS 0.05 % ophthalmic emulsion Place 1 drop into both eyes as needed (dry eye).    rosuvastatin (CRESTOR) 5 MG tablet Take 1 tablet (5 mg total) by mouth daily.   spironolactone (ALDACTONE) 100 MG tablet Take 100 mg by mouth daily.    sucralfate (CARAFATE) 1 GM/10ML suspension Take 10 mLs (1 g total) by mouth 4 (four) times daily -  with meals and at bedtime.   triamcinolone (KENALOG) 0.1 % Apply 1 application topically daily as needed (irritation).   venlafaxine XR (EFFEXOR-XR) 150 MG 24 hr capsule Take total of 225 mg daily (150 mg + 75 mg )   venlafaxine XR (EFFEXOR-XR) 75 MG 24 hr capsule To take along with 139m for a total of 225 mg daily   Vitamin D, Ergocalciferol, (DRISDOL) 1.25 MG (50000 UNIT) CAPS capsule Take 1 capsule (50,000 Units total) by mouth every 7 (seven) days. (Patient taking differently: Take 50,000 Units by mouth every Tuesday.)   [DISCONTINUED] glipiZIDE (GLUCOTROL XL) 5 MG 24 hr tablet Take 1 tablet (5 mg total) by mouth daily with breakfast.   [DISCONTINUED] insulin regular human CONCENTRATED (HUMULIN R U-500 KWIKPEN) 500 UNIT/ML KwikPen Inject 90 Units into the skin 3 (three) times daily with meals.   [DISCONTINUED] metFORMIN (GLUCOPHAGE) 1000 MG tablet Take 1 tablet (1,000 mg total) by mouth 2 (two) times daily with a meal.   [DISCONTINUED] MOUNJARO 7.5 MG/0.5ML Pen INJECT 7.5MG INTO THE SKIN ONCE A WEEK.   glipiZIDE (GLUCOTROL XL) 5 MG 24 hr tablet Take 1 tablet (5 mg total) by mouth daily with breakfast.   insulin regular human CONCENTRATED (HUMULIN R U-500 KWIKPEN) 500 UNIT/ML KwikPen Inject 90 Units into the skin 3 (three) times  daily with meals.   metFORMIN (GLUCOPHAGE) 1000 MG tablet Take 1 tablet (1,000 mg total) by mouth 2  (two) times daily with a meal.   tirzepatide (MOUNJARO) 7.5 MG/0.5ML Pen Inject 7.5 mg into the skin once a week.   No facility-administered encounter medications on file as of 09/29/2022.    ALLERGIES: Allergies  Allergen Reactions   Iodine-131 Anaphylaxis, Shortness Of Breath and Swelling   Ivp Dye [Iodinated Contrast Media] Anaphylaxis    Skin gets very red, unable to walk    Ketorolac Tromethamine Shortness Of Breath   Tylenol [Acetaminophen] Other (See Comments)    Due to cirrhosis    Vancomycin Swelling    Facial swelling,    Gabapentin Itching   Nsaids Other (See Comments)    Flares ulcers   Suboxone [Buprenorphine Hcl-Naloxone Hcl]     Withdrawal symptoms     VACCINATION STATUS: Immunization History  Administered Date(s) Administered   Influenza,inj,Quad PF,6+ Mos 07/18/2016   Moderna Sars-Covid-2 Vaccination 03/07/2020, 04/04/2020   Pneumococcal Polysaccharide-23 07/18/2016, 08/01/2020    Diabetes She presents for her follow-up diabetic visit. She has type 2 diabetes mellitus. Onset time: She was diagnosed at approximate age of 50 years. Her disease course has been worsening. There are no hypoglycemic associated symptoms. Pertinent negatives for hypoglycemia include no headaches or pallor. (Had symptoms of hypoglycemia when glucose dropped to 117.) Associated symptoms include fatigue, foot paresthesias, polydipsia, polyuria and weight loss. Pertinent negatives for diabetes include no blurred vision, no chest pain and no polyphagia. There are no hypoglycemic complications. (History of unresponsiveness requiring EMS and hospitalization- none recent) Symptoms are stable. Diabetic complications include heart disease and peripheral neuropathy. Risk factors for coronary artery disease include diabetes mellitus, obesity, sedentary lifestyle, dyslipidemia and hypertension. Current diabetic treatment includes oral agent (dual therapy) and intensive insulin program (and Mounjaro).  She is compliant with treatment some of the time (has missed many doses of insulin lately due to memory impairment). Her weight is decreasing steadily. She is following a low salt and generally unhealthy diet. When asked about meal planning, she reported none. She has had a previous visit with a dietitian. She participates in exercise intermittently. Her home blood glucose trend is increasing steadily. Her breakfast blood glucose range is generally >200 mg/dl. Her lunch blood glucose range is generally >200 mg/dl. Her dinner blood glucose range is generally >200 mg/dl. Her bedtime blood glucose range is generally >200 mg/dl. Her overall blood glucose range is >200 mg/dl. (She presents today, accompanied by her mother, with her CGM showing gross hyperglycemia.  Her POCT A1c today is 11.1%, increasing from last visit of 10.5%.  She notes she thinks she has missed a lot of her U500 doses due to memory impairment.  She also notes she still drinks gatorade frequently.  Analysis of her CGM shows TIR 5%, TAR 95%, TBR 0% with a GMI of 12%.) An ACE inhibitor/angiotensin II receptor blocker is being taken. She does not see a podiatrist.Eye exam is not current.  Hypertension This is a chronic problem. The current episode started more than 1 month ago. The problem has been resolved since onset. The problem is controlled. Associated symptoms include peripheral edema. Pertinent negatives include no blurred vision, chest pain, headaches, palpitations or shortness of breath. There are no associated agents to hypertension. Risk factors for coronary artery disease include diabetes mellitus, dyslipidemia, obesity and sedentary lifestyle. Past treatments include diuretics and beta blockers. The current treatment provides no improvement. There are no compliance problems.  Review of systems  Constitutional: + decreasing body weight,  current Body mass index is 39.26 kg/m. , + fatigue, no subjective hyperthermia, no subjective  hypothermia, + memory impairment Eyes: no blurry vision, no xerophthalmia ENT: no sore throat, no nodules palpated in throat, no dysphagia/odynophagia, no hoarseness Cardiovascular: no chest pain, no shortness of breath, no palpitations, no leg swelling Respiratory: no cough, no shortness of breath Gastrointestinal: no nausea/vomiting/diarrhea Musculoskeletal: no muscle/joint aches Skin: no rashes, no hyperemia Neurological: no tremors, no numbness, no tingling, no dizziness Psychiatric: + depression, no anxiety  Objective:     BP 100/61 (BP Location: Right Arm, Patient Position: Sitting, Cuff Size: Large)   Pulse 83   Ht 5' 10" (1.778 m)   Wt 273 lb 9.6 oz (124.1 kg)   BMI 39.26 kg/m   Wt Readings from Last 3 Encounters:  09/29/22 273 lb 9.6 oz (124.1 kg)  08/09/22 280 lb (127 kg)  07/19/22 280 lb (127 kg)    BP Readings from Last 3 Encounters:  09/29/22 100/61  08/09/22 139/83  07/19/22 121/75     Physical Exam- Limited  Constitutional:  Body mass index is 39.26 kg/m. , not in acute distress, normal state of mind, mildly slurred speech, dry mucus membranes Eyes:  EOMI, no exophthalmos Musculoskeletal: no gross deformities, strength intact in all four extremities, no gross restriction of joint movements Skin:  no rashes, no hyperemia Neurological: no tremor with outstretched hands   Diabetic Foot Exam - Simple   No data filed    CMP ( most recent) CMP     Component Value Date/Time   NA 136 08/09/2022 0956   NA 140 12/03/2021 0832   K 4.0 08/09/2022 0956   CL 103 08/09/2022 0956   CO2 26 08/09/2022 0956   GLUCOSE 257 (H) 08/09/2022 0956   BUN 5 (L) 08/09/2022 0956   BUN 6 12/03/2021 0832   CREATININE 0.53 08/09/2022 0956   CREATININE 0.66 05/18/2022 1113   CALCIUM 8.3 (L) 08/09/2022 0956   PROT 6.6 08/09/2022 0956   PROT 5.8 (L) 12/03/2021 0832   ALBUMIN 2.5 (L) 08/09/2022 0956   ALBUMIN 3.0 (L) 12/03/2021 0832   AST 39 08/09/2022 0956   ALT 25  08/09/2022 0956   ALKPHOS 122 08/09/2022 0956   BILITOT 2.7 (H) 08/09/2022 0956   BILITOT 0.6 12/03/2021 0832   GFRNONAA >60 08/09/2022 0956   GFRAA >60 08/01/2020 0704     Diabetic Labs (most recent): Lab Results  Component Value Date   HGBA1C 11.1 (A) 09/29/2022   HGBA1C 10.5 05/19/2022   HGBA1C 8.4 (A) 02/10/2022   MICROALBUR 30 12/10/2021   MICROALBUR 80 02/26/2021     Lipid Panel ( most recent) Lipid Panel     Component Value Date/Time   CHOL 182 12/03/2021 0832   TRIG 190 (H) 12/03/2021 0832   HDL 46 12/03/2021 0832   CHOLHDL 4.0 12/03/2021 0832   LDLCALC 103 (H) 12/03/2021 0832   LABVLDL 33 12/03/2021 0832      Lab Results  Component Value Date   TSH 2.700 12/03/2021   TSH 0.635 09/08/2020   TSH 0.993 07/30/2020   TSH 1.990 07/23/2020   TSH 1.690 08/22/2010   FREET4 0.70 (L) 12/03/2021   FREET4 0.72 (L) 07/23/2020   FREET4 1.11 08/22/2010      Assessment & Plan:   1) Type 2 diabetes mellitus with hyperglycemia, without long-term current use of insulin (HCC)  - Tatiyanna Lashley has currently uncontrolled symptomatic type 2 DM  since  36 years of age.  She presents today, accompanied by her mother, with her CGM showing gross hyperglycemia.  Her POCT A1c today is 11.1%, increasing from last visit of 10.5%.  She notes she thinks she has missed a lot of her U500 doses due to memory impairment.  She also notes she still drinks gatorade frequently.  Analysis of her CGM shows TIR 5%, TAR 95%, TBR 0% with a GMI of 12%.  - I had a long discussion with her about the progressive nature of diabetes and the pathology behind its complications.  -her diabetes is complicated by obesity/sedentary life and she remains at a high risk for more acute and chronic complications which include CAD, CVA, CKD, retinopathy, and neuropathy. These are all discussed in detail with her.  - Nutritional counseling repeated at each appointment due to patients tendency to fall back in to old  habits.  - The patient admits there is a room for improvement in their diet and drink choices. -  Suggestion is made for the patient to avoid simple carbohydrates from their diet including Cakes, Sweet Desserts / Pastries, Ice Cream, Soda (diet and regular), Sweet Tea, Candies, Chips, Cookies, Sweet Pastries, Store Bought Juices, Alcohol in Excess of 1-2 drinks a day, Artificial Sweeteners, Coffee Creamer, and "Sugar-free" Products. This will help patient to have stable blood glucose profile and potentially avoid unintended weight gain.   - I encouraged the patient to switch to unprocessed or minimally processed complex starch and increased protein intake (animal or plant source), fruits, and vegetables.   - Patient is advised to stick to a routine mealtimes to eat 3 meals a day and avoid unnecessary snacks (to snack only to correct hypoglycemia).  - I have approached her with the following individualized plan to manage  her diabetes and patient agrees:   -She is advised to continue her U500 at 90 units with breakfast and lunch and 80 units with supper if glucose is above 90 and she is eating but to be more consistent with taking it.  She can continue her Mounjaro 7.5 mg SQ weekly.  She can also continue Metformin 1000 mg po twice daily with meals and Glipizide 5 mg XL daily with breakfast.   -She is encouraged to continue to monitor glucose 4 times daily (using her CGM), before meals and before bed and log on the clinic sheets provided.  They are instructed to call the clinic if she has readings less than 70 or greater than 300 for 3 tests in a row.     - Specific targets for  A1c;  LDL, HDL,  and Triglycerides were discussed with the patient.  2) Blood Pressure /Hypertension: Her blood pressure is controlled to target.  She is advised to continue Lasix 80 mg po daily, continue Lisinopril 5 mg po daily, continue Propanolol 10 mg po TID, and Aldactone 100 mg o daily  3) Lipids/Hyperlipidemia:    Review of her recent lipid profile from 12/03/21 shows uncontrolled LDL at 103-improved and elevated triglycerides of 190-improved.  She is advised to continue Crestor 5 mg po daily at bedtime.  Side effects and precautions discussed with her.    4)  Weight/Diet:  Her Body mass index is 39.26 kg/m.  -   clearly complicating her diabetes care.   she is  a candidate for modest weight loss. I discussed with her the fact that loss of 5 - 10% of her  current body weight will have the most impact  on her diabetes management.  Exercise, and detailed carbohydrates information provided  -  detailed on discharge instructions.  5) Vitamin D deficiency Her most recent vitamin d level on 12/03/21 was 9.8.  I discussed and initiated replenishment with Ergocalciferol 50000 units po weekly- she is still taking this.  6) Chronic Care/Health Maintenance: -she is on ACE and statin medications and is encouraged to initiate and continue to follow up with Ophthalmology, Dentist,  Podiatrist at least yearly or according to recommendations, and advised to stay away from smoking. I have recommended yearly flu vaccine and pneumonia vaccine at least every 5 years; moderate intensity exercise for up to 150 minutes weekly; and  sleep for at least 7 hours a day.  - she is advised to maintain close follow up with Hasanaj, Samul Dada, MD for primary care needs, as well as her other providers for optimal and coordinated care.     I spent 40 minutes in the care of the patient today including review of labs from [facility], Lipids, Thyroid Function, Hematology (current and previous including abstractions from other facilities); face-to-face time discussing  her blood glucose readings/logs, discussing hypoglycemia and hyperglycemia episodes and symptoms, medications doses, her options of short and long term treatment based on the latest standards of care / guidelines;  discussion about incorporating lifestyle medicine;  and documenting the  encounter. Risk reduction counseling performed per USPSTF guidelines to reduce obesity and cardiovascular risk factors.     Please refer to Patient Instructions for Blood Glucose Monitoring and Insulin/Medications Dosing Guide"  in media tab for additional information. Please  also refer to " Patient Self Inventory" in the Media  tab for reviewed elements of pertinent patient history.  Remus Blake participated in the discussions, expressed understanding, and voiced agreement with the above plans.  All questions were answered to her satisfaction. she is encouraged to contact clinic should she have any questions or concerns prior to her return visit.    Follow up plan: - Return in about 1 month (around 10/29/2022) for Diabetes F/U, Bring meter and logs.    Rayetta Pigg, [facility] [address] [place], [place] [address] Phone: [phone] Fax: [phone]   09/29/2022, 9:55 AM

## 2022-11-10 ENCOUNTER — Ambulatory Visit: Payer: Medicaid Other | Admitting: Nurse Practitioner

## 2022-11-10 DIAGNOSIS — E1165 Type 2 diabetes mellitus with hyperglycemia: Secondary | ICD-10-CM

## 2022-11-10 DIAGNOSIS — E782 Mixed hyperlipidemia: Secondary | ICD-10-CM

## 2022-11-10 DIAGNOSIS — Z91119 Patient's noncompliance with dietary regimen due to unspecified reason: Secondary | ICD-10-CM

## 2022-11-16 ENCOUNTER — Ambulatory Visit: Payer: Medicaid Other | Admitting: Nurse Practitioner

## 2022-11-16 DIAGNOSIS — E782 Mixed hyperlipidemia: Secondary | ICD-10-CM

## 2022-11-16 DIAGNOSIS — Z91119 Patient's noncompliance with dietary regimen due to unspecified reason: Secondary | ICD-10-CM

## 2022-11-16 DIAGNOSIS — E1165 Type 2 diabetes mellitus with hyperglycemia: Secondary | ICD-10-CM

## 2022-11-18 ENCOUNTER — Ambulatory Visit: Payer: Medicaid Other | Admitting: Nurse Practitioner

## 2022-11-18 ENCOUNTER — Telehealth: Payer: Self-pay | Admitting: Nurse Practitioner

## 2022-11-18 DIAGNOSIS — Z91119 Patient's noncompliance with dietary regimen due to unspecified reason: Secondary | ICD-10-CM

## 2022-11-18 DIAGNOSIS — E1165 Type 2 diabetes mellitus with hyperglycemia: Secondary | ICD-10-CM

## 2022-11-18 DIAGNOSIS — E782 Mixed hyperlipidemia: Secondary | ICD-10-CM

## 2022-11-18 MED ORDER — DEXCOM G6 SENSOR MISC: 3 refills | Status: DC

## 2022-11-18 MED ORDER — DEXCOM G6 TRANSMITTER MISC: 1 refills | Status: DC

## 2022-11-18 NOTE — Telephone Encounter (Signed)
Can you send in her Animator to [employer]

## 2022-11-18 NOTE — Telephone Encounter (Signed)
done

## 2022-11-19 ENCOUNTER — Ambulatory Visit (INDEPENDENT_AMBULATORY_CARE_PROVIDER_SITE_OTHER): Payer: Medicaid Other | Admitting: Gastroenterology

## 2022-11-23 ENCOUNTER — Telehealth: Payer: Self-pay | Admitting: *Deleted

## 2022-11-23 NOTE — Telephone Encounter (Signed)
Which meds does she need right now and we can send them in?  I haven't seen any request for PA.

## 2022-11-23 NOTE — Telephone Encounter (Signed)
[name] left a message that she needed her medication filled and she was told by her insurance company that it was needing a PA .

## 2022-11-25 NOTE — Telephone Encounter (Signed)
Patient states that she is needing a PA for the Dexcom G 7 and the Dexcom G 7 reader., transmitter.

## 2022-11-27 ENCOUNTER — Ambulatory Visit: Payer: Medicaid Other | Admitting: Nurse Practitioner

## 2022-11-27 DIAGNOSIS — E1165 Type 2 diabetes mellitus with hyperglycemia: Secondary | ICD-10-CM

## 2022-11-27 DIAGNOSIS — Z91119 Patient's noncompliance with dietary regimen due to unspecified reason: Secondary | ICD-10-CM

## 2022-11-27 DIAGNOSIS — E782 Mixed hyperlipidemia: Secondary | ICD-10-CM

## 2022-12-03 ENCOUNTER — Other Ambulatory Visit (HOSPITAL_COMMUNITY): Payer: Self-pay

## 2022-12-03 ENCOUNTER — Telehealth: Payer: Self-pay | Admitting: Pharmacy Technician

## 2022-12-03 ENCOUNTER — Other Ambulatory Visit (HOSPITAL_BASED_OUTPATIENT_CLINIC_OR_DEPARTMENT_OTHER): Payer: Self-pay

## 2022-12-03 NOTE — Telephone Encounter (Signed)
Pharmacy Patient Advocate Encounter   Received notification from pt call msgs/LPN that prior authorization for Dexcom G7 is required/requested.   PA submitted on 12/03/22 to (ins) [employer] via CoverMyMeds Key [id number] (sensors) Status is pending

## 2022-12-03 NOTE — Telephone Encounter (Signed)
Received fax back from [facility] that they couldn't locate the member in their database.   Called & left a HIPAA compliant voice mail asking for pt's pharmacy ins. Information. Will continue to see what I can find, possibly in [place] Tracks.    PA submitted on 12/03/22 to (ins) [place] [employer] via [place] [employer] Confirmation #: [id number] W (sensors); [id number] W (receiver) [place] Status is pending

## 2022-12-04 NOTE — Telephone Encounter (Signed)
Pt's mom calling stating she is needing a PA on the transmitter too

## 2022-12-08 NOTE — Telephone Encounter (Signed)
Pt's mom said she needs the G6 sensor and G6 transmitter

## 2022-12-09 ENCOUNTER — Telehealth (INDEPENDENT_AMBULATORY_CARE_PROVIDER_SITE_OTHER): Payer: Self-pay

## 2022-12-09 NOTE — Telephone Encounter (Signed)
Received PA on patient for Movantik, chart states she supposed to be on Amitiza. I called the patient left message on her vm on 12/08/2022 and 12/09/2022 asked that she return call to clarify which medication she is on. I also called her mother on 12/08/2022 and asked that she please have the patient to call the office. She states she would let the patient know.

## 2022-12-10 NOTE — Telephone Encounter (Signed)
I spoke with the patient mother she says she had told Sybilla to call our office. I also called the patient phone # and left a message asked that she return call.

## 2022-12-11 NOTE — Telephone Encounter (Signed)
Patient has not response to repeated calls to her home and to her mothers home. We do not know what medication the patient is currently on.

## 2022-12-18 ENCOUNTER — Ambulatory Visit: Payer: Medicaid Other | Admitting: Family Medicine

## 2022-12-22 ENCOUNTER — Other Ambulatory Visit (HOSPITAL_COMMUNITY): Payer: Self-pay

## 2022-12-22 NOTE — Telephone Encounter (Signed)
  PA submitted for Dexcom G6 via [place] [employer] Confirmation #  [id number] W (sensors) [id number] (transmitter) Status is pending

## 2022-12-24 ENCOUNTER — Ambulatory Visit (INDEPENDENT_AMBULATORY_CARE_PROVIDER_SITE_OTHER): Payer: Medicaid Other | Admitting: Nurse Practitioner

## 2022-12-24 ENCOUNTER — Encounter: Payer: Self-pay | Admitting: Nurse Practitioner

## 2022-12-24 VITALS — BP 125/84 | HR 102 | Ht 70.0 in | Wt 254.0 lb

## 2022-12-24 DIAGNOSIS — E782 Mixed hyperlipidemia: Secondary | ICD-10-CM | POA: Diagnosis not present

## 2022-12-24 DIAGNOSIS — Z91119 Patient's noncompliance with dietary regimen due to unspecified reason: Secondary | ICD-10-CM

## 2022-12-24 DIAGNOSIS — E1165 Type 2 diabetes mellitus with hyperglycemia: Secondary | ICD-10-CM | POA: Diagnosis not present

## 2022-12-24 MED ORDER — HUMULIN R U-500 KWIKPEN 500 UNIT/ML ~~LOC~~ SOPN: 110.0000 [IU] | PEN_INJECTOR | Freq: Three times a day (TID) | SUBCUTANEOUS | 3 refills | Status: DC

## 2022-12-24 MED ORDER — MOUNJARO 7.5 MG/0.5ML ~~LOC~~ SOAJ: 7.5000 mg | SUBCUTANEOUS | 3 refills | Status: DC

## 2022-12-24 MED ORDER — DEXCOM G6 TRANSMITTER MISC: 1 refills | Status: DC

## 2022-12-24 MED ORDER — METFORMIN HCL 1000 MG PO TABS: 1000.0000 mg | ORAL_TABLET | Freq: Two times a day (BID) | ORAL | 3 refills | Status: DC

## 2022-12-24 MED ORDER — GLIPIZIDE ER 5 MG PO TB24: 5.0000 mg | ORAL_TABLET | Freq: Every day | ORAL | 1 refills | Status: DC

## 2022-12-24 MED ORDER — DEXCOM G6 SENSOR MISC: 3 refills | Status: DC

## 2022-12-24 NOTE — Progress Notes (Signed)
12/24/2022, 11:41 AM     Endocrinology Follow Up Visit  Subjective:    Patient ID: Jaime [name], female    DOB: [birth year].  Jaime [name] is being seen in follow up for management of currently uncontrolled symptomatic diabetes requested by  Neale Burly, MD.   Past Medical History:  Diagnosis Date   Anxiety    Asthma    Chronic abdominal pain    Chronic back pain    Cirrhosis ([place])    Cirrhosis ([place])    COPD (chronic obstructive pulmonary disease) ([place])    Depression    Diabetes mellitus without complication ([place])    Diabetes mellitus, type II ([place])    GERD (gastroesophageal reflux disease)    Hepatitis C    Hyperlipidemia    Insomnia    Long-term current use of methadone for opiate dependence ([place])    Lupus ([place])    Migraine headache    Neuropathy    Nocturnal seizures ([place])    Peptic ulcer    Spleen enlarged     Past Surgical History:  Procedure Laterality Date   BIOPSY  11/25/2021   Procedure: BIOPSY;  Surgeon: Harvel Quale, MD;  [facility];  Service: Gastroenterology;;   CHOLECYSTECTOMY     COLONOSCOPY WITH PROPOFOL N/A 11/25/2021   Procedure: COLONOSCOPY WITH PROPOFOL;  Surgeon: Harvel Quale, MD;  Location: [facility];  Service: Gastroenterology;  Laterality: N/A;  805   COLONOSCOPY WITH PROPOFOL N/A 03/31/2022   Procedure: COLONOSCOPY WITH PROPOFOL;  Surgeon: Harvel Quale, MD;  [facility];  Service: Gastroenterology;  Laterality: N/A;  730   ESOPHAGOGASTRODUODENOSCOPY  06/2020   done at [facility], candida in upper esophagus (treated with diflucan), ulcerative esophagitis at GE junction, gastritis in stomach, single ulcer in duodenal bulb, with duodenal mucosa showing no abnormality. No presence of varices   ESOPHAGOGASTRODUODENOSCOPY (EGD) WITH PROPOFOL N/A 11/25/2021   Procedure: ESOPHAGOGASTRODUODENOSCOPY (EGD)  WITH PROPOFOL;  Surgeon: Harvel Quale, MD;  [facility];  Service: Gastroenterology;  Laterality: N/A;   ESOPHAGOGASTRODUODENOSCOPY (EGD) WITH PROPOFOL N/A 03/31/2022   Procedure: ESOPHAGOGASTRODUODENOSCOPY (EGD) WITH PROPOFOL;  Surgeon: Harvel Quale, MD;  [facility];  Service: Gastroenterology;  Laterality: N/A;    Social History   Socioeconomic History   Marital status: Single    Spouse name: Not on file   Number of children: Not on file   Years of education: Not on file   Highest education level: Not on file  Occupational History   Not on file  Tobacco Use   Smoking status: Every Day    Types: E-cigarettes   Smokeless tobacco: Never  Vaping Use   Vaping Use: Every day   Substances: Nicotine, Flavoring  Substance and Sexual Activity   Alcohol use: No   Drug use: No   Sexual activity: Not Currently    Birth control/protection: Implant  Other Topics Concern   Not on file  Social History Narrative   Not on file   Social Determinants of Health   Financial Resource Strain: Medium Risk (05/09/2020)   Overall Financial Resource  Strain (CARDIA)    Difficulty of Paying Living Expenses: Somewhat hard  Food Insecurity: No Food Insecurity (05/09/2020)   Hunger Vital Sign    Worried About Running Out of Food in the Last Year: Never true    Ran Out of Food in the Last Year: Never true  Transportation Needs: No Transportation Needs (05/09/2020)   PRAPARE - Hydrologist (Medical): No    Lack of Transportation (Non-Medical): No  Physical Activity: Insufficiently Active (05/09/2020)   Exercise Vital Sign    Days of Exercise per Week: 1 day    Minutes of Exercise per Session: 20 min  Stress: Stress Concern Present (05/09/2020)   [place]    Feeling of Stress : Very much  Social Connections: Moderately Isolated (05/09/2020)   Social Connection and  Isolation Panel [NHANES]    Frequency of Communication with Friends and Family: More than three times a week    Frequency of Social Gatherings with Friends and Family: Twice a week    Attends Religious Services: More than 4 times per year    Active Member of [employer] or Organizations: No    Attends Archivist Meetings: Never    Marital Status: Never married    Family History  Problem Relation Age of Onset   Hypertension Mother    Hyperlipidemia Mother    Hyperlipidemia Maternal Grandfather     Outpatient Encounter Medications as of 12/24/2022  Medication Sig   Accu-Chek Softclix Lancets lancets USE TO TEST BLOOD SUGAR 4 TIMES A DAY.   albuterol (VENTOLIN HFA) 108 (90 Base) MCG/ACT inhaler Inhale 2 puffs into the lungs every 6 (six) hours as needed for wheezing or shortness of breath.   ARIPiprazole (ABILIFY) 20 MG tablet Take 20 mg by mouth daily.   aspirin EC 81 MG tablet Take 81 mg by mouth daily. Swallow whole.   cephALEXin (KEFLEX) 500 MG capsule Take 1 capsule (500 mg total) by mouth 4 (four) times daily.   cetirizine (ZYRTEC ALLERGY) 10 MG tablet Take 1 tablet (10 mg total) by mouth daily.   clonazePAM (KLONOPIN) 0.5 MG tablet Take 0.5 mg by mouth 2 (two) times daily.   Continuous Blood Gluc Sensor (DEXCOM G6 SENSOR) MISC Change sensor every 10 days.   Continuous Blood Gluc Transmit (DEXCOM G6 TRANSMITTER) MISC USE AS DIRECTED.   cromolyn (OPTICROM) 4 % ophthalmic solution Place 1 drop into both eyes 2 (two) times daily.   etonogestrel (NEXPLANON) 68 MG IMPL implant 1 each by Subdermal route once.   fluticasone (FLONASE) 50 MCG/ACT nasal spray Place 2 sprays into both nostrils daily.   furosemide (LASIX) 40 MG tablet Take 40 mg by mouth daily.   glipiZIDE (GLUCOTROL XL) 5 MG 24 hr tablet Take 1 tablet (5 mg total) by mouth daily with breakfast.   glucose blood (ACCU-CHEK GUIDE) test strip Use as instructed to check blood glucose four times daily   hyoscyamine (ANASPAZ)  0.125 MG TBDP disintergrating tablet TAKE 1 TABLET UNDER THE TONGUE THREE TIMES A DAY AS NEEDED FOR BLADDER SPASMS OR CRAMPING.   Insulin Pen Needle (GLOBAL EASE INJECT PEN NEEDLES) 31G X 8 MM MISC Use 1 pen needle 4 times daily for insulin.   Insulin Pen Needle (PEN NEEDLES) 32G X 6 MM MISC 1 each by Does not apply route 3 (three) times daily.   insulin regular human CONCENTRATED (HUMULIN R U-500 KWIKPEN) 500 UNIT/ML KwikPen Inject 110 Units into  the skin 3 (three) times daily with meals.   lisinopril (ZESTRIL) 5 MG tablet TAKE 1 TABLET ONCE DAILY.   lubiprostone (AMITIZA) 24 MCG capsule Take 1 capsule (24 mcg total) by mouth 2 (two) times daily with a meal.   menthol-cetylpyridinium (CEPACOL) 3 MG lozenge Take 1 lozenge (3 mg total) by mouth as needed for sore throat.   metFORMIN (GLUCOPHAGE) 1000 MG tablet Take 1 tablet (1,000 mg total) by mouth 2 (two) times daily with a meal.   methadone (DOLOPHINE) 10 MG/5ML solution Take 60 mg by mouth every morning.   methocarbamol (ROBAXIN) 500 MG tablet Take by mouth at bedtime.   Multiple Vitamin (MULTIVITAMIN) tablet Take 1 tablet by mouth daily.   naloxegol oxalate (MOVANTIK) 25 MG TABS tablet Take 1 tablet (25 mg total) by mouth daily.   nystatin (MYCOSTATIN/NYSTOP) powder Apply 1 application topically 3 (three) times daily. (Patient taking differently: Apply 1 application  topically 3 (three) times daily as needed (irritation).)   omeprazole (PRILOSEC) 40 MG capsule TAKE 1 CAPSULE BY MOUTH TWICE DAILY.   polyethylene glycol-electrolytes (TRILYTE) 420 g solution Take 4,000 mLs by mouth as directed.   potassium chloride (KLOR-CON M) 10 MEQ tablet Take 10 mEq by mouth 2 (two) times daily.   pregabalin (LYRICA) 75 MG capsule Take 75 mg by mouth in the morning, at noon, in the evening, and at bedtime.    promethazine (PHENERGAN) 12.5 MG tablet Take 1 tablet (12.5 mg total) by mouth every 8 (eight) hours as needed for nausea or vomiting.   propranolol  (INDERAL) 10 MG tablet Take 10 mg by mouth 3 (three) times daily.   RESTASIS 0.05 % ophthalmic emulsion Place 1 drop into both eyes as needed (dry eye).    rosuvastatin (CRESTOR) 5 MG tablet Take 1 tablet (5 mg total) by mouth daily.   spironolactone (ALDACTONE) 100 MG tablet Take 100 mg by mouth daily.    sucralfate (CARAFATE) 1 GM/10ML suspension Take 10 mLs (1 g total) by mouth 4 (four) times daily -  with meals and at bedtime.   tirzepatide (MOUNJARO) 7.5 MG/0.5ML Pen Inject 7.5 mg into the skin once a week.   triamcinolone (KENALOG) 0.1 % Apply 1 application topically daily as needed (irritation).   venlafaxine XR (EFFEXOR-XR) 150 MG 24 hr capsule Take total of 225 mg daily (150 mg + 75 mg )   venlafaxine XR (EFFEXOR-XR) 75 MG 24 hr capsule To take along with 162m for a total of 225 mg daily   Vitamin D, Ergocalciferol, (DRISDOL) 1.25 MG (50000 UNIT) CAPS capsule Take 1 capsule (50,000 Units total) by mouth every 7 (seven) days. (Patient taking differently: Take 50,000 Units by mouth every Tuesday.)   [DISCONTINUED] benzonatate (TESSALON) 100 MG capsule Take 1 capsule (100 mg total) by mouth every 8 (eight) hours.   [DISCONTINUED] blood glucose meter kit and supplies Dispense based on patient and insurance preference. Use up to four times daily as directed. (FOR ICD-10 E10.9, E11.9).   [DISCONTINUED] Continuous Blood Gluc Sensor (DEXCOM G6 SENSOR) MISC Change sensor every 10 days.   [DISCONTINUED] Continuous Blood Gluc Sensor (DEXCOM G7 SENSOR) MISC Inject 1 Application into the skin as directed. Change sensor every 10 days as directed.   [DISCONTINUED] Continuous Blood Gluc Transmit (DEXCOM G6 TRANSMITTER) MISC USE AS DIRECTED.   [DISCONTINUED] glipiZIDE (GLUCOTROL XL) 5 MG 24 hr tablet Take 1 tablet (5 mg total) by mouth daily with breakfast.   [DISCONTINUED] insulin regular human CONCENTRATED (HUMULIN R U-500 KWIKPEN)  500 UNIT/ML KwikPen Inject 90 Units into the skin 3 (three) times daily  with meals.   [DISCONTINUED] metFORMIN (GLUCOPHAGE) 1000 MG tablet Take 1 tablet (1,000 mg total) by mouth 2 (two) times daily with a meal.   [DISCONTINUED] promethazine-dextromethorphan (PROMETHAZINE-DM) 6.25-15 MG/5ML syrup Take 5 mLs by mouth 4 (four) times daily as needed for cough.   [DISCONTINUED] tirzepatide (MOUNJARO) 7.5 MG/0.5ML Pen Inject 7.5 mg into the skin once a week.   No facility-administered encounter medications on file as of 12/24/2022.    ALLERGIES: Allergies  Allergen Reactions   Iodine-131 Anaphylaxis, Shortness Of Breath and Swelling   Ivp Dye [Iodinated Contrast Media] Anaphylaxis    Skin gets very red, unable to walk    Ketorolac Tromethamine Shortness Of Breath   Tylenol [Acetaminophen] Other (See Comments)    Due to cirrhosis    Vancomycin Swelling    Facial swelling,    Gabapentin Itching   Nsaids Other (See Comments)    Flares ulcers   Suboxone [Buprenorphine Hcl-Naloxone Hcl]     Withdrawal symptoms     VACCINATION STATUS: Immunization History  Administered Date(s) Administered   Influenza,inj,Quad PF,6+ Mos 07/18/2016   Moderna Sars-Covid-2 Vaccination 03/07/2020, 04/04/2020   Pneumococcal Polysaccharide-23 07/18/2016, 08/01/2020    Diabetes She presents for her follow-up diabetic visit. She has type 2 diabetes mellitus. Onset time: She was diagnosed at approximate age of 20 years. Her disease course has been worsening. There are no hypoglycemic associated symptoms. Pertinent negatives for hypoglycemia include no headaches or pallor. (Had symptoms of hypoglycemia when glucose dropped to 117.) Associated symptoms include fatigue, foot paresthesias, polydipsia, polyuria and weight loss. Pertinent negatives for diabetes include no blurred vision, no chest pain and no polyphagia. There are no hypoglycemic complications. (History of unresponsiveness requiring EMS and hospitalization- none recent) Symptoms are stable. Diabetic complications include heart  disease and peripheral neuropathy. Risk factors for coronary artery disease include diabetes mellitus, obesity, sedentary lifestyle, dyslipidemia and hypertension. Current diabetic treatment includes oral agent (dual therapy) and intensive insulin program (and Mounjaro). She is compliant with treatment some of the time (has missed many doses of insulin lately due to memory impairment). Her weight is decreasing steadily. She is following a low salt and generally unhealthy diet. When asked about meal planning, she reported none. She has had a previous visit with a dietitian. She participates in exercise intermittently. Her home blood glucose trend is increasing steadily. Her breakfast blood glucose range is generally >200 mg/dl. Her lunch blood glucose range is generally >200 mg/dl. Her dinner blood glucose range is generally >200 mg/dl. Her bedtime blood glucose range is generally >200 mg/dl. Her overall blood glucose range is >200 mg/dl. (She presents today, accompanied by her mother, with her CGM showing gross hyperglycemia.  She was not due for another A1c today.  Analysis of her CGM shows TIR 0%, TAR (92% in level 2 hyperglycemia), TBR 0% with GMI of 12.1%.  She is still consuming sweet beverages.  She got married last week and has plans to start working out together with her husband.) An ACE inhibitor/angiotensin II receptor blocker is being taken. She does not see a podiatrist.Eye exam is not current.  Hypertension This is a chronic problem. The current episode started more than 1 month ago. The problem has been resolved since onset. The problem is controlled. Associated symptoms include peripheral edema. Pertinent negatives include no blurred vision, chest pain, headaches, palpitations or shortness of breath. There are no associated agents to hypertension. Risk factors  for coronary artery disease include diabetes mellitus, dyslipidemia, obesity and sedentary lifestyle. Past treatments include diuretics and  beta blockers. The current treatment provides no improvement. There are no compliance problems.     Review of systems  Constitutional: + decreasing body weight,  current Body mass index is 36.45 kg/m. , + fatigue, no subjective hyperthermia, no subjective hypothermia, + memory impairment Eyes: no blurry vision, no xerophthalmia ENT: no sore throat, no nodules palpated in throat, no dysphagia/odynophagia, no hoarseness Cardiovascular: no chest pain, no shortness of breath, no palpitations, no leg swelling Respiratory: no cough, no shortness of breath Gastrointestinal: no nausea/vomiting/diarrhea Musculoskeletal: no muscle/joint aches Skin: no rashes, no hyperemia Neurological: no tremors, no numbness, no tingling, no dizziness Psychiatric: + depression, no anxiety  Objective:     BP 125/84   Pulse (!) 102   Ht 5' 10"$  (1.778 m)   Wt 254 lb (115.2 kg)   BMI 36.45 kg/m   Wt Readings from Last 3 Encounters:  12/24/22 254 lb (115.2 kg)  09/29/22 273 lb 9.6 oz (124.1 kg)  08/09/22 280 lb (127 kg)    BP Readings from Last 3 Encounters:  12/24/22 125/84  09/29/22 100/61  08/09/22 139/83     Physical Exam- Limited  Constitutional:  Body mass index is 36.45 kg/m. , not in acute distress, normal state of mind Eyes:  EOMI, no exophthalmos Musculoskeletal: no gross deformities, strength intact in all four extremities, no gross restriction of joint movements Skin:  no rashes, no hyperemia Neurological: no tremor with outstretched hands   Diabetic Foot Exam - Simple   No data filed    CMP ( most recent) CMP     Component Value Date/Time   NA 136 08/09/2022 0956   NA 140 12/03/2021 0832   K 4.0 08/09/2022 0956   CL 103 08/09/2022 0956   CO2 26 08/09/2022 0956   GLUCOSE 257 (H) 08/09/2022 0956   BUN 5 (L) 08/09/2022 0956   BUN 6 12/03/2021 0832   CREATININE 0.53 08/09/2022 0956   CREATININE 0.66 05/18/2022 1113   CALCIUM 8.3 (L) 08/09/2022 0956   PROT 6.6 08/09/2022  0956   PROT 5.8 (L) 12/03/2021 0832   ALBUMIN 2.5 (L) 08/09/2022 0956   ALBUMIN 3.0 (L) 12/03/2021 0832   AST 39 08/09/2022 0956   ALT 25 08/09/2022 0956   ALKPHOS 122 08/09/2022 0956   BILITOT 2.7 (H) 08/09/2022 0956   BILITOT 0.6 12/03/2021 0832   GFRNONAA >60 08/09/2022 0956   GFRAA >60 08/01/2020 0704     Diabetic Labs (most recent): Lab Results  Component Value Date   HGBA1C 11.1 (A) 09/29/2022   HGBA1C 10.5 05/19/2022   HGBA1C 8.4 (A) 02/10/2022   MICROALBUR 30 12/10/2021   MICROALBUR 80 02/26/2021     Lipid Panel ( most recent) Lipid Panel     Component Value Date/Time   CHOL 182 12/03/2021 0832   TRIG 190 (H) 12/03/2021 0832   HDL 46 12/03/2021 0832   CHOLHDL 4.0 12/03/2021 0832   LDLCALC 103 (H) 12/03/2021 0832   LABVLDL 33 12/03/2021 0832      Lab Results  Component Value Date   TSH 2.700 12/03/2021   TSH 0.635 09/08/2020   TSH 0.993 07/30/2020   TSH 1.990 07/23/2020   TSH 1.690 08/22/2010   FREET4 0.70 (L) 12/03/2021   FREET4 0.72 (L) 07/23/2020   FREET4 1.11 08/22/2010      Assessment & Plan:   1) Type 2 diabetes mellitus with hyperglycemia, without  long-term current use of insulin ([place])  - Jaime [name] has currently uncontrolled symptomatic type 2 DM since  37 years of age.  She presents today, accompanied by her mother, with her CGM showing gross hyperglycemia.  She was not due for another A1c today.  Analysis of her CGM shows TIR 0%, TAR (92% in level 2 hyperglycemia), TBR 0% with GMI of 12.1%.  She is still consuming sweet beverages.  She got married last week and has plans to start working out together with her husband.  - I had a long discussion with her about the progressive nature of diabetes and the pathology behind its complications.  -her diabetes is complicated by obesity/sedentary life and she remains at a high risk for more acute and chronic complications which include CAD, CVA, CKD, retinopathy, and neuropathy. These are all  discussed in detail with her.  - Nutritional counseling repeated at each appointment due to patients tendency to fall back in to old habits.  - The patient admits there is a room for improvement in their diet and drink choices. -  Suggestion is made for the patient to avoid simple carbohydrates from their diet including Cakes, Sweet Desserts / Pastries, Ice Cream, Soda (diet and regular), Sweet Tea, Candies, Chips, Cookies, Sweet Pastries, Store Bought Juices, Alcohol in Excess of 1-2 drinks a day, Artificial Sweeteners, Coffee Creamer, and "Sugar-free" Products. This will help patient to have stable blood glucose profile and potentially avoid unintended weight gain.   - I encouraged the patient to switch to unprocessed or minimally processed complex starch and increased protein intake (animal or plant source), fruits, and vegetables.   - Patient is advised to stick to a routine mealtimes to eat 3 meals a day and avoid unnecessary snacks (to snack only to correct hypoglycemia).  - I have approached her with the following individualized plan to manage  her diabetes and patient agrees:   -She is advised to increase her U500 to 110 units TID with meals if glucose is above 90 and she is eating but to be more consistent with taking it.  She can continue her Mounjaro 7.5 mg SQ weekly.  She can also continue Metformin 1000 mg po twice daily with meals and Glipizide 5 mg XL daily with breakfast.   -She is encouraged to continue to monitor glucose 4 times daily (using her CGM), before meals and before bed and log on the clinic sheets provided.  They are instructed to call the clinic if she has readings less than 70 or greater than 300 for 3 tests in a row.     - Specific targets for  A1c;  LDL, HDL,  and Triglycerides were discussed with the patient.  2) Blood Pressure /Hypertension: Her blood pressure is controlled to target.  She is advised to continue Lasix 80 mg po daily, continue Lisinopril 5 mg po  daily, continue Propanolol 10 mg po TID, and Aldactone 100 mg o daily  3) Lipids/Hyperlipidemia:   Review of her recent lipid profile from 12/03/21 shows uncontrolled LDL at 103-improved and elevated triglycerides of 190-improved.  She is advised to continue Crestor 5 mg po daily at bedtime.  Side effects and precautions discussed with her.    4)  Weight/Diet:  Her Body mass index is 36.45 kg/m.  -   clearly complicating her diabetes care.   she is  a candidate for modest weight loss. I discussed with her the fact that loss of 5 - 10% of her  current  body weight will have the most impact on her diabetes management.  Exercise, and detailed carbohydrates information provided  -  detailed on discharge instructions.  5) Vitamin D deficiency Her most recent vitamin d level on 12/03/21 was 9.8.  I discussed and initiated replenishment with Ergocalciferol 50000 units po weekly- she is still taking this.  6) Chronic Care/Health Maintenance: -she is on ACE and statin medications and is encouraged to initiate and continue to follow up with Ophthalmology, Dentist,  Podiatrist at least yearly or according to recommendations, and advised to stay away from smoking. I have recommended yearly flu vaccine and pneumonia vaccine at least every 5 years; moderate intensity exercise for up to 150 minutes weekly; and  sleep for at least 7 hours a day.  - she is advised to maintain close follow up with Hasanaj, Samul Dada, MD for primary care needs, as well as her other providers for optimal and coordinated care.     I spent  30  minutes in the care of the patient today including review of labs from [facility], Lipids, Thyroid Function, Hematology (current and previous including abstractions from other facilities); face-to-face time discussing  her blood glucose readings/logs, discussing hypoglycemia and hyperglycemia episodes and symptoms, medications doses, her options of short and long term treatment based on the latest standards  of care / guidelines;  discussion about incorporating lifestyle medicine;  and documenting the encounter. Risk reduction counseling performed per USPSTF guidelines to reduce obesity and cardiovascular risk factors.     Please refer to Patient Instructions for Blood Glucose Monitoring and Insulin/Medications Dosing Guide"  in media tab for additional information. Please  also refer to " Patient Self Inventory" in the Media  tab for reviewed elements of pertinent patient history.  Remus Blake participated in the discussions, expressed understanding, and voiced agreement with the above plans.  All questions were answered to her satisfaction. she is encouraged to contact clinic should she have any questions or concerns prior to her return visit.    Follow up plan: - Return in about 3 months (around 03/24/2023) for Diabetes F/U with A1c in office, No previsit labs, Bring meter and logs.    Rayetta Pigg, [facility] [address] [place], [place] [address] Phone: [phone] Fax: [phone]   12/24/2022, 11:41 AM

## 2023-01-01 ENCOUNTER — Ambulatory Visit (INDEPENDENT_AMBULATORY_CARE_PROVIDER_SITE_OTHER): Payer: Medicaid Other | Admitting: Family Medicine

## 2023-01-01 ENCOUNTER — Encounter: Payer: Self-pay | Admitting: Family Medicine

## 2023-01-01 VITALS — BP 123/73 | HR 115 | Ht 69.0 in | Wt 270.0 lb

## 2023-01-01 DIAGNOSIS — E785 Hyperlipidemia, unspecified: Secondary | ICD-10-CM

## 2023-01-01 DIAGNOSIS — G4700 Insomnia, unspecified: Secondary | ICD-10-CM | POA: Insufficient documentation

## 2023-01-01 DIAGNOSIS — M545 Low back pain, unspecified: Secondary | ICD-10-CM

## 2023-01-01 DIAGNOSIS — Z79899 Other long term (current) drug therapy: Secondary | ICD-10-CM

## 2023-01-01 DIAGNOSIS — E039 Hypothyroidism, unspecified: Secondary | ICD-10-CM

## 2023-01-01 DIAGNOSIS — I10 Essential (primary) hypertension: Secondary | ICD-10-CM | POA: Diagnosis not present

## 2023-01-01 DIAGNOSIS — Z1159 Encounter for screening for other viral diseases: Secondary | ICD-10-CM

## 2023-01-01 DIAGNOSIS — R7301 Impaired fasting glucose: Secondary | ICD-10-CM

## 2023-01-01 DIAGNOSIS — F339 Major depressive disorder, recurrent, unspecified: Secondary | ICD-10-CM | POA: Insufficient documentation

## 2023-01-01 DIAGNOSIS — F5101 Primary insomnia: Secondary | ICD-10-CM

## 2023-01-01 DIAGNOSIS — G8929 Other chronic pain: Secondary | ICD-10-CM | POA: Insufficient documentation

## 2023-01-01 DIAGNOSIS — Z114 Encounter for screening for human immunodeficiency virus [HIV]: Secondary | ICD-10-CM

## 2023-01-01 MED ORDER — CYCLOBENZAPRINE HCL 5 MG PO TABS: 5.0000 mg | ORAL_TABLET | Freq: Three times a day (TID) | ORAL | 0 refills | Status: DC | PRN

## 2023-01-01 NOTE — Assessment & Plan Note (Addendum)
[place] Office Visit from 01/01/2023 in [facility]  PHQ-9 Total Score 9     Patient on multiple psych medications Referral to behavior service placed Discussed non pharmacological interventions such seeing a therapist, support system, diet, exercise, sleep. Follow up in 3 months or sooner if needed. Patient verbalizes understanding regarding plan of care and all questions answered.

## 2023-01-01 NOTE — Assessment & Plan Note (Addendum)
Prescriptions for back pain  Flexeril 5 mg as initial therapy sent to pharmacy. Discussed medication desired effects, potential side effects. Non pharmacological interventions include rest, avoid twisting, improper bending, straining lower back. Demonstration of proper body mechanics. Alternate ice and heat. Recommend stretching back and legs. Follow up for worsening or persistent symptoms. Start a low carb high protein diet for weight loss. Patient verbalizes understanding regarding plan of care and all questions answered.

## 2023-01-01 NOTE — Patient Instructions (Signed)
It was pleasure meeting with you today. Please take medications as prescribed. Follow up with your primary health provider if any health concerns arises. If symptoms worsen please contact your primary care provider and/or visit the emergency department.

## 2023-01-01 NOTE — Assessment & Plan Note (Signed)
Explained to go to bed at the same time each night and get up at the same time each morning, including on the weekends. Make sure your bedroom is quiet, dark, relaxing, and at a comfortable temperature. Remove electronic devices, such as TVs, computers, and smart phones, from the bedroom.

## 2023-01-01 NOTE — Progress Notes (Signed)
New Patient Office Visit   Subjective   Patient ID: Jaime [name], female    DOB: 07-Nov-1985  Age: 37 y.o. MRN: [id number]  CC:  Chief Complaint  Patient presents with   Establish Care    HPI Jaime [name]  37 year old presents to establish care. She  has a past medical history of Anxiety, Asthma, Chronic abdominal pain, Chronic back pain, Cirrhosis ([place]), Cirrhosis ([place]), COPD (chronic obstructive pulmonary disease) ([place]), Depression, Diabetes mellitus without complication ([place]), Diabetes mellitus, type II ([place]), GERD (gastroesophageal reflux disease), Hepatitis C, Hyperlipidemia, Insomnia, Long-term current use of methadone for opiate dependence ([place]), Lupus ([place]), Migraine headache, Neuropathy, Nocturnal seizures ([place]), Peptic ulcer, and Spleen enlarged.  Back Pain This is a chronic problem. The current episode started more than 1 month ago. The problem occurs constantly. The problem is unchanged. The pain is present in the lumbar spine. The quality of the pain is described as aching, shooting and stabbing. The pain does not radiate. The pain is at a severity of 8/10. The pain is moderate. The pain is The same all the time. The symptoms are aggravated by position, sitting and stress. Stiffness is present All day. Pertinent negatives include no bladder incontinence, bowel incontinence, chest pain, fever, numbness, paresis, paresthesias, pelvic pain or perianal numbness. Risk factors include lack of exercise, obesity and sedentary lifestyle. Treatments tried: methadone. The treatment provided no relief.  Insomnia Primary symptoms: sleep disturbance, difficulty falling asleep, frequent awakening.   The current episode started more than one month. The problem occurs nightly. The problem has been gradually worsening since onset. The symptoms are aggravated by anxiety, medication changes, family issues and SSRI use. How many beverages per day that contain caffeine: 2-3.  Types of beverages  you drink: soda. The symptoms are relieved by darkened room, quiet environment and relaxation. Treatments tried: OTC melatonin. The treatment provided no relief. Typical bedtime:  10-11 P.M..  How long after going to bed to you fall asleep: over an hour.   PMH includes: hypertension, depression, family stress or anxiety, chronic pain.       Outpatient Encounter Medications as of 01/01/2023  Medication Sig   Accu-Chek Softclix Lancets lancets USE TO TEST BLOOD SUGAR 4 TIMES A DAY.   albuterol (VENTOLIN HFA) 108 (90 Base) MCG/ACT inhaler Inhale 2 puffs into the lungs every 6 (six) hours as needed for wheezing or shortness of breath.   ARIPiprazole (ABILIFY) 20 MG tablet Take 20 mg by mouth daily.   aspirin EC 81 MG tablet Take 81 mg by mouth daily. Swallow whole.   cetirizine (ZYRTEC ALLERGY) 10 MG tablet Take 1 tablet (10 mg total) by mouth daily.   clonazePAM (KLONOPIN) 0.5 MG tablet Take 0.5 mg by mouth 2 (two) times daily.   Continuous Blood Gluc Sensor (DEXCOM G6 SENSOR) MISC Change sensor every 10 days.   Continuous Blood Gluc Transmit (DEXCOM G6 TRANSMITTER) MISC USE AS DIRECTED.   cromolyn (OPTICROM) 4 % ophthalmic solution Place 1 drop into both eyes 2 (two) times daily.   cyclobenzaprine (FLEXERIL) 5 MG tablet Take 1 tablet (5 mg total) by mouth 3 (three) times daily as needed for muscle spasms.   etonogestrel (NEXPLANON) 68 MG IMPL implant 1 each by Subdermal route once.   fluticasone (FLONASE) 50 MCG/ACT nasal spray Place 2 sprays into both nostrils daily.   Glecaprevir-Pibrentasvir (MAVYRET) 100-40 MG TABS Take 3 tablets by mouth daily.   glucose blood (ACCU-CHEK GUIDE) test strip Use as instructed to  check blood glucose four times daily   hyoscyamine (ANASPAZ) 0.125 MG TBDP disintergrating tablet TAKE 1 TABLET UNDER THE TONGUE THREE TIMES A DAY AS NEEDED FOR BLADDER SPASMS OR CRAMPING.   Insulin Pen Needle (GLOBAL EASE INJECT PEN NEEDLES) 31G X 8 MM MISC Use 1 pen needle 4 times daily  for insulin.   Insulin Pen Needle (PEN NEEDLES) 32G X 6 MM MISC 1 each by Does not apply route 3 (three) times daily.   insulin regular human CONCENTRATED (HUMULIN R U-500 KWIKPEN) 500 UNIT/ML KwikPen Inject 110 Units into the skin 3 (three) times daily with meals.   LINZESS 290 MCG CAPS capsule Take 290 mcg by mouth daily.   liraglutide (VICTOZA) 18 MG/3ML SOPN Inject into the skin.   lisinopril (ZESTRIL) 5 MG tablet TAKE 1 TABLET ONCE DAILY.   lubiprostone (AMITIZA) 24 MCG capsule Take 1 capsule (24 mcg total) by mouth 2 (two) times daily with a meal.   metFORMIN (GLUCOPHAGE) 1000 MG tablet Take 1 tablet (1,000 mg total) by mouth 2 (two) times daily with a meal.   methadone (DOLOPHINE) 10 MG/5ML solution Take 60 mg by mouth every morning.   methocarbamol (ROBAXIN) 500 MG tablet Take by mouth at bedtime.   Multiple Vitamin (MULTIVITAMIN) tablet Take 1 tablet by mouth daily.   naloxegol oxalate (MOVANTIK) 25 MG TABS tablet Take 1 tablet (25 mg total) by mouth daily.   nystatin (MYCOSTATIN/NYSTOP) powder Apply 1 application topically 3 (three) times daily. (Patient taking differently: Apply 1 application  topically 3 (three) times daily as needed (irritation).)   omeprazole (PRILOSEC) 40 MG capsule TAKE 1 CAPSULE BY MOUTH TWICE DAILY.   phenazopyridine (PYRIDIUM) 100 MG tablet Take by mouth.   polyethylene glycol (MIRALAX / GLYCOLAX) 17 g packet Take by mouth.   potassium chloride (KLOR-CON M) 10 MEQ tablet Take 10 mEq by mouth 2 (two) times daily.   pregabalin (LYRICA) 75 MG capsule Take 75 mg by mouth in the morning, at noon, in the evening, and at bedtime.    prochlorperazine (COMPAZINE) 5 MG tablet Take by mouth.   promethazine (PHENERGAN) 12.5 MG tablet Take 1 tablet (12.5 mg total) by mouth every 8 (eight) hours as needed for nausea or vomiting.   propranolol (INDERAL) 10 MG tablet Take 10 mg by mouth 3 (three) times daily.   ranitidine (ZANTAC) 75 MG tablet Take by mouth.   RESTASIS 0.05  % ophthalmic emulsion Place 1 drop into both eyes as needed (dry eye).    rosuvastatin (CRESTOR) 5 MG tablet Take 1 tablet (5 mg total) by mouth daily.   spironolactone (ALDACTONE) 100 MG tablet Take 100 mg by mouth daily.    sucralfate (CARAFATE) 1 GM/10ML suspension Take 10 mLs (1 g total) by mouth 4 (four) times daily -  with meals and at bedtime.   Suvorexant (BELSOMRA) 20 MG TABS Take by mouth.   tetrahydrozoline 0.05 % ophthalmic solution 2 drops 2 (two) times a day as needed.   venlafaxine XR (EFFEXOR-XR) 150 MG 24 hr capsule Take total of 225 mg daily (150 mg + 75 mg )   venlafaxine XR (EFFEXOR-XR) 75 MG 24 hr capsule To take along with '150mg'$  for a total of 225 mg daily   Vitamin D, Ergocalciferol, (DRISDOL) 1.25 MG (50000 UNIT) CAPS capsule Take 1 capsule (50,000 Units total) by mouth every 7 (seven) days. (Patient taking differently: Take 50,000 Units by mouth every Tuesday.)   vortioxetine HBr (TRINTELLIX) 20 MG TABS tablet Take by mouth.   [  DISCONTINUED] cyclobenzaprine (FLEXERIL) 5 MG tablet Take by mouth.   [DISCONTINUED] LORazepam (ATIVAN) 1 MG tablet Take by mouth.   [DISCONTINUED] zolpidem (AMBIEN) 10 MG tablet Take by mouth.   cephALEXin (KEFLEX) 500 MG capsule Take 1 capsule (500 mg total) by mouth 4 (four) times daily.   furosemide (LASIX) 40 MG tablet Take 40 mg by mouth daily.   glipiZIDE (GLUCOTROL XL) 5 MG 24 hr tablet Take 1 tablet (5 mg total) by mouth daily with breakfast.   menthol-cetylpyridinium (CEPACOL) 3 MG lozenge Take 1 lozenge (3 mg total) by mouth as needed for sore throat.   polyethylene glycol-electrolytes (TRILYTE) 420 g solution Take 4,000 mLs by mouth as directed.   tirzepatide (MOUNJARO) 7.5 MG/0.5ML Pen Inject 7.5 mg into the skin once a week.   triamcinolone (KENALOG) 0.1 % Apply 1 application topically daily as needed (irritation).   No facility-administered encounter medications on file as of 01/01/2023.    Past Surgical History:  Procedure  Laterality Date   BIOPSY  11/25/2021   Procedure: BIOPSY;  Surgeon: Harvel Quale, MD;  [facility];  Service: Gastroenterology;;   CHOLECYSTECTOMY     COLONOSCOPY WITH PROPOFOL N/A 11/25/2021   Procedure: COLONOSCOPY WITH PROPOFOL;  Surgeon: Harvel Quale, MD;  [facility]: [facility];  Service: Gastroenterology;  Laterality: N/A;  805   COLONOSCOPY WITH PROPOFOL N/A 03/31/2022   Procedure: COLONOSCOPY WITH PROPOFOL;  Surgeon: Harvel Quale, MD;  [facility]: [facility];  Service: Gastroenterology;  Laterality: N/A;  730   ESOPHAGOGASTRODUODENOSCOPY  06/2020   done at [facility], candida in upper esophagus (treated with diflucan), ulcerative esophagitis at GE junction, gastritis in stomach, single ulcer in duodenal bulb, with duodenal mucosa showing no abnormality. No presence of varices   ESOPHAGOGASTRODUODENOSCOPY (EGD) WITH PROPOFOL N/A 11/25/2021   Procedure: ESOPHAGOGASTRODUODENOSCOPY (EGD) WITH PROPOFOL;  Surgeon: Harvel Quale, MD;  [facility];  Service: Gastroenterology;  Laterality: N/A;   ESOPHAGOGASTRODUODENOSCOPY (EGD) WITH PROPOFOL N/A 03/31/2022   Procedure: ESOPHAGOGASTRODUODENOSCOPY (EGD) WITH PROPOFOL;  Surgeon: Harvel Quale, MD;  [facility];  Service: Gastroenterology;  Laterality: N/A;    Review of Systems  Constitutional:  Negative for chills and fever.  Respiratory:  Negative for shortness of breath.   Cardiovascular:  Negative for chest pain.  Gastrointestinal:  Negative for bowel incontinence, nausea and vomiting.  Genitourinary:  Negative for bladder incontinence and pelvic pain.  Musculoskeletal:  Positive for back pain.  Neurological:  Negative for numbness and paresthesias.  Psychiatric/Behavioral:  Positive for depression and sleep disturbance. The patient has insomnia.       Objective    BP 123/73   Pulse (!) 115   Ht '5\' 9"'$  (1.753 m)   Wt 270 lb (122.5 kg)   SpO2  96%   BMI 39.87 kg/m   Physical Exam Constitutional:      Appearance: She is obese.  HENT:     Head: Normocephalic.  Cardiovascular:     Rate and Rhythm: Normal rate.     Pulses: Normal pulses.  Pulmonary:     Effort: Pulmonary effort is normal.  Musculoskeletal:     Lumbar back: Negative right straight leg raise test and negative left straight leg raise test.  Skin:    General: Skin is warm and dry.     Capillary Refill: Capillary refill takes less than 2 seconds.  Neurological:     General: No focal deficit present.     Mental Status: She  is alert.  Psychiatric:        Mood and Affect: Mood normal.       Assessment & Plan:  Chronic bilateral low back pain, unspecified whether sciatica present Assessment & Plan: Prescriptions for back pain  Flexeril 5 mg as initial therapy sent to pharmacy. Discussed medication desired effects, potential side effects. Non pharmacological interventions include rest, avoid twisting, improper bending, straining lower back. Demonstration of proper body mechanics. Alternate ice and heat. Recommend stretching back and legs. Follow up for worsening or persistent symptoms. Start a low carb high protein diet for weight loss. Patient verbalizes understanding regarding plan of care and all questions answered.   Orders: -     Cyclobenzaprine HCl; Take 1 tablet (5 mg total) by mouth 3 (three) times daily as needed for muscle spasms.  Dispense: 30 tablet; Refill: 0  Primary hypertension -     CBC with Differential/Platelet -     CMP14+EGFR -     Microalbumin / creatinine urine ratio  IFG (impaired fasting glucose) -     Hemoglobin A1c  Hyperlipidemia, unspecified hyperlipidemia type -     Lipid panel  Hypothyroidism, unspecified type -     TSH + free T4  Need for hepatitis C screening test  Screening for HIV (human immunodeficiency virus)  Depression, recurrent [facility]) Assessment & Plan: [place] [facility] Visit from 01/01/2023 in [facility]  PHQ-9 Total Score 9     Patient on multiple psych medications Referral to behavior service placed Discussed non pharmacological interventions such seeing a therapist, support system, diet, exercise, sleep. Follow up in 3 months or sooner if needed. Patient verbalizes understanding regarding plan of care and all questions answered.   Orders: -     Amb ref to [facility]  Polypharmacy -     AMB Referral to Pharmacy Medication Management  Primary insomnia Assessment & Plan: Explained to go to bed at the same time each night and get up at the same time each morning, including on the weekends. Make sure your bedroom is quiet, dark, relaxing, and at a comfortable temperature. Remove electronic devices, such as TVs, computers, and smart phones, from the bedroom.       Return in about 1 week (around 01/08/2023) for Abdominal hernia .   Renard Hamper Ria Comment, FNP

## 2023-01-03 LAB — CBC WITH DIFFERENTIAL/PLATELET

## 2023-01-04 ENCOUNTER — Emergency Department (HOSPITAL_COMMUNITY)
Admission: EM | Admit: 2023-01-04 | Discharge: 2023-01-04 | Disposition: A | Discharge status: Home / Self Care | Payer: Medicaid Other | Attending: Emergency Medicine | Admitting: Emergency Medicine

## 2023-01-04 ENCOUNTER — Other Ambulatory Visit: Payer: Self-pay

## 2023-01-04 ENCOUNTER — Emergency Department (HOSPITAL_COMMUNITY): Payer: Medicaid Other

## 2023-01-04 ENCOUNTER — Encounter (HOSPITAL_COMMUNITY): Payer: Self-pay

## 2023-01-04 DIAGNOSIS — R1012 Left upper quadrant pain: Secondary | ICD-10-CM | POA: Insufficient documentation

## 2023-01-04 DIAGNOSIS — R112 Nausea with vomiting, unspecified: Secondary | ICD-10-CM | POA: Diagnosis not present

## 2023-01-04 DIAGNOSIS — R1010 Upper abdominal pain, unspecified: Secondary | ICD-10-CM

## 2023-01-04 DIAGNOSIS — R197 Diarrhea, unspecified: Secondary | ICD-10-CM | POA: Insufficient documentation

## 2023-01-04 DIAGNOSIS — R000 Tachycardia, unspecified: Secondary | ICD-10-CM | POA: Insufficient documentation

## 2023-01-04 DIAGNOSIS — R739 Hyperglycemia, unspecified: Secondary | ICD-10-CM | POA: Insufficient documentation

## 2023-01-04 DIAGNOSIS — Z87891 Personal history of nicotine dependence: Secondary | ICD-10-CM | POA: Diagnosis not present

## 2023-01-04 DIAGNOSIS — R079 Chest pain, unspecified: Secondary | ICD-10-CM | POA: Diagnosis not present

## 2023-01-04 DIAGNOSIS — R1011 Right upper quadrant pain: Secondary | ICD-10-CM | POA: Insufficient documentation

## 2023-01-04 DIAGNOSIS — Z7982 Long term (current) use of aspirin: Secondary | ICD-10-CM | POA: Insufficient documentation

## 2023-01-04 LAB — LIPASE, BLOOD: Lipase: 31 U/L (ref 11–51)

## 2023-01-04 LAB — LIPID PANEL
Chol/HDL Ratio: 3.8 ratio (ref 0.0–4.4)
Cholesterol, Total: 134 mg/dL (ref 100–199)
HDL: 35 mg/dL — ABNORMAL LOW (ref >39)
LDL Chol Calc (NIH): 57 mg/dL (ref 0–99)
Triglycerides: 264 mg/dL — ABNORMAL HIGH (ref 0–149)
VLDL Cholesterol Cal: 42 mg/dL — ABNORMAL HIGH (ref 5–40)

## 2023-01-04 LAB — CBC
HCT: 42.4 % (ref 36.0–46.0)
Hemoglobin: 13.6 g/dL (ref 12.0–15.0)
MCH: 29.7 pg (ref 26.0–34.0)
MCHC: 32.1 g/dL (ref 30.0–36.0)
MCV: 92.6 fL (ref 80.0–100.0)
Platelets: 61 10*3/uL — ABNORMAL LOW (ref 150–400)
RBC: 4.58 MIL/uL (ref 3.87–5.11)
RDW: 14.1 % (ref 11.5–15.5)
WBC: 5.1 10*3/uL (ref 4.0–10.5)
nRBC: 0 % (ref 0.0–0.2)

## 2023-01-04 LAB — COMPREHENSIVE METABOLIC PANEL
ALT: 30 U/L (ref 0–44)
AST: 45 U/L — ABNORMAL HIGH (ref 15–41)
Albumin: 3 g/dL — ABNORMAL LOW (ref 3.5–5.0)
Alkaline Phosphatase: 121 U/L (ref 38–126)
Anion gap: 6 (ref 5–15)
BUN: 7 mg/dL (ref 6–20)
CO2: 20 mmol/L — ABNORMAL LOW (ref 22–32)
Calcium: 7.7 mg/dL — ABNORMAL LOW (ref 8.9–10.3)
Chloride: 110 mmol/L (ref 98–111)
Creatinine, Ser: 0.59 mg/dL (ref 0.44–1.00)
GFR, Estimated: >60 mL/min (ref >60)
Glucose, Bld: 263 mg/dL — ABNORMAL HIGH (ref 70–99)
Potassium: 4.2 mmol/L (ref 3.5–5.1)
Sodium: 136 mmol/L (ref 135–145)
Total Bilirubin: 1.2 mg/dL (ref 0.3–1.2)
Total Protein: 6.4 g/dL — ABNORMAL LOW (ref 6.5–8.1)

## 2023-01-04 LAB — CMP14+EGFR
ALT: 28 IU/L (ref 0–32)
AST: 35 IU/L (ref 0–40)
Albumin/Globulin Ratio: 1.3 (ref 1.2–2.2)
Albumin: 3.3 g/dL — ABNORMAL LOW (ref 3.9–4.9)
Alkaline Phosphatase: 144 IU/L — ABNORMAL HIGH (ref 44–121)
BUN/Creatinine Ratio: 17 (ref 9–23)
BUN: 11 mg/dL (ref 6–20)
Bilirubin Total: 1.3 mg/dL — ABNORMAL HIGH (ref 0.0–1.2)
CO2: 21 mmol/L (ref 20–29)
Calcium: 8.5 mg/dL — ABNORMAL LOW (ref 8.7–10.2)
Chloride: 104 mmol/L (ref 96–106)
Creatinine, Ser: 0.65 mg/dL (ref 0.57–1.00)
Globulin, Total: 2.5 g/dL (ref 1.5–4.5)
Glucose: 274 mg/dL — ABNORMAL HIGH (ref 70–99)
Potassium: 3.8 mmol/L (ref 3.5–5.2)
Sodium: 143 mmol/L (ref 134–144)
Total Protein: 5.8 g/dL — ABNORMAL LOW (ref 6.0–8.5)
eGFR: 115 mL/min/{1.73_m2} (ref >59)

## 2023-01-04 LAB — URINALYSIS, ROUTINE W REFLEX MICROSCOPIC
Bilirubin Urine: NEGATIVE
Glucose, UA: >=500 mg/dL — AB
Hgb urine dipstick: NEGATIVE
Ketones, ur: NEGATIVE mg/dL
Leukocytes,Ua: NEGATIVE
Nitrite: POSITIVE — AB
Protein, ur: NEGATIVE mg/dL
Specific Gravity, Urine: 1.017 (ref 1.005–1.030)
pH: 5 (ref 5.0–8.0)

## 2023-01-04 LAB — CBC WITH DIFFERENTIAL/PLATELET
Basophils Absolute: 0 10*3/uL (ref 0.0–0.2)
Basos: 0 %
EOS (ABSOLUTE): 0.1 10*3/uL (ref 0.0–0.4)
Eos: 5 %
Hematocrit: 41.9 % (ref 34.0–46.6)
Hemoglobin: 13.5 g/dL (ref 11.1–15.9)
Immature Grans (Abs): 0 10*3/uL (ref 0.0–0.1)
Immature Granulocytes: 0 %
Lymphocytes Absolute: 0.7 10*3/uL (ref 0.7–3.1)
Lymphs: 23 %
MCH: 29.5 pg (ref 26.6–33.0)
MCHC: 32.2 g/dL (ref 31.5–35.7)
MCV: 92 fL (ref 79–97)
Monocytes Absolute: 0.3 10*3/uL (ref 0.1–0.9)
Monocytes: 10 %
Neutrophils Absolute: 1.7 10*3/uL (ref 1.4–7.0)
Neutrophils: 62 %
Platelets: 55 10*3/uL — CL (ref 150–450)
RBC: 4.58 x10E6/uL (ref 3.77–5.28)
RDW: 13.1 % (ref 11.7–15.4)
WBC: 2.8 10*3/uL — ABNORMAL LOW (ref 3.4–10.8)

## 2023-01-04 LAB — TSH+FREE T4
Free T4: 0.97 ng/dL (ref 0.82–1.77)
TSH: 2.28 u[IU]/mL (ref 0.450–4.500)

## 2023-01-04 LAB — MICROALBUMIN / CREATININE URINE RATIO
Creatinine, Urine: 201.6 mg/dL
Microalb/Creat Ratio: 8 mg/g creat (ref 0–29)
Microalbumin, Urine: 16.8 ug/mL

## 2023-01-04 LAB — CBG MONITORING, ED: Glucose-Capillary: 267 mg/dL — ABNORMAL HIGH (ref 70–99)

## 2023-01-04 LAB — POC URINE PREG, ED: Preg Test, Ur: NEGATIVE

## 2023-01-04 LAB — HEMOGLOBIN A1C
Est. average glucose Bld gHb Est-mCnc: 220 mg/dL
Hgb A1c MFr Bld: 9.3 % — ABNORMAL HIGH (ref 4.8–5.6)

## 2023-01-04 MED ORDER — SODIUM CHLORIDE 0.9 % IV BOLUS
500.0000 mL | Freq: Once | INTRAVENOUS | Status: AC
  Administered 2023-01-04: 500 mL via INTRAVENOUS

## 2023-01-04 MED ORDER — OXYCODONE HCL 5 MG PO TABS
5.0000 mg | ORAL_TABLET | Freq: Once | ORAL | Status: AC
  Administered 2023-01-04: 5 mg via ORAL
  Filled 2023-01-04: qty 1

## 2023-01-04 MED ORDER — FENTANYL CITRATE (PF) 100 MCG/2ML IJ SOLN
100.0000 ug | Freq: Once | INTRAMUSCULAR | Status: AC
  Administered 2023-01-04: 100 ug via INTRAVENOUS
  Filled 2023-01-04: qty 2

## 2023-01-04 MED ORDER — CEPHALEXIN 500 MG PO CAPS: 500.0000 mg | ORAL_CAPSULE | Freq: Four times a day (QID) | ORAL | 0 refills | Status: DC

## 2023-01-04 MED ORDER — PROMETHAZINE HCL 25 MG/ML IJ SOLN
INTRAMUSCULAR | Status: AC
  Filled 2023-01-04: qty 1

## 2023-01-04 MED ORDER — MORPHINE SULFATE (PF) 4 MG/ML IV SOLN: 4.0000 mg | Freq: Once | INTRAVENOUS | Status: DC

## 2023-01-04 MED ORDER — PANTOPRAZOLE SODIUM 40 MG IV SOLR
40.0000 mg | Freq: Once | INTRAVENOUS | Status: AC
  Administered 2023-01-04: 40 mg via INTRAVENOUS
  Filled 2023-01-04: qty 10

## 2023-01-04 MED ORDER — SODIUM CHLORIDE 0.9 % IV SOLN
25.0000 mg | Freq: Once | INTRAVENOUS | Status: AC
  Administered 2023-01-04: 25 mg via INTRAVENOUS
  Filled 2023-01-04: qty 1

## 2023-01-04 NOTE — ED Provider Notes (Signed)
[facility] Provider Note   CSN: [id number] Arrival date & time: 01/04/23  1925     History {Add pertinent medical, surgical, social history, OB history to HPI:1} No chief complaint on file.   Jaime [name] is a 37 y.o. female.  He is here with a complaint of pain in her upper abdomen and into her chest associated with nausea vomiting and diarrhea.  She is not sure if she has had a fever.  She rates the pain as 10 out of 10.  She said she has had this pain before but never this bad.  She said she has a history of ulcers and follows with GI Dr. Oscar La.  She denies that she is on chronic pain medicine at this time.  The history is provided by the patient.  Abdominal Pain Pain location:  Epigastric, RUQ and LUQ Pain quality: aching   Pain radiates to:  Chest Pain severity:  Severe Onset quality:  Gradual Duration:  1 day Timing:  Constant Progression:  Unchanged Chronicity:  Recurrent Context: not trauma   Relieved by:  Nothing Worsened by:  Nothing Ineffective treatments:  None tried Associated symptoms: chest pain, diarrhea, nausea and vomiting   Associated symptoms: no constipation, no cough, no dysuria, no fever, no shortness of breath and no sore throat        Home Medications Prior to Admission medications   Medication Sig Start Date End Date Taking? Authorizing Provider  Accu-Chek Softclix Lancets lancets USE TO TEST BLOOD SUGAR 4 TIMES A DAY. 05/28/22   Brita Romp, NP  albuterol (VENTOLIN HFA) 108 (90 Base) MCG/ACT inhaler Inhale 2 puffs into the lungs every 6 (six) hours as needed for wheezing or shortness of breath. 07/17/22   Leath-Warren, Alda Lea, NP  ARIPiprazole (ABILIFY) 20 MG tablet Take 20 mg by mouth daily. 10/15/21   [provider]  aspirin EC 81 MG tablet Take 81 mg by mouth daily. Swallow whole.    [provider]  cephALEXin (KEFLEX) 500 MG capsule Take 1 capsule (500 mg  total) by mouth 4 (four) times daily. 08/09/22   Triplett, Tammy, PA-C  cetirizine (ZYRTEC ALLERGY) 10 MG tablet Take 1 tablet (10 mg total) by mouth daily. 07/09/20   Avegno, Darrelyn Hillock, FNP  clonazePAM (KLONOPIN) 0.5 MG tablet Take 0.5 mg by mouth 2 (two) times daily.    [provider]  Continuous Blood Gluc Sensor (DEXCOM G6 SENSOR) MISC Change sensor every 10 days. 12/24/22   Brita Romp, NP  Continuous Blood Gluc Transmit (DEXCOM G6 TRANSMITTER) MISC USE AS DIRECTED. 12/24/22   Brita Romp, NP  cromolyn (OPTICROM) 4 % ophthalmic solution Place 1 drop into both eyes 2 (two) times daily. 01/28/21   [provider]  cyclobenzaprine (FLEXERIL) 5 MG tablet Take 1 tablet (5 mg total) by mouth 3 (three) times daily as needed for muscle spasms. 01/01/23   Del Eli Hose, FNP  etonogestrel (NEXPLANON) 68 MG IMPL implant 1 each by Subdermal route once.    [provider]  fluticasone (FLONASE) 50 MCG/ACT nasal spray Place 2 sprays into both nostrils daily. 07/17/22   Leath-Warren, Alda Lea, NP  furosemide (LASIX) 40 MG tablet Take 40 mg by mouth daily. 05/22/20   [provider]  Glecaprevir-Pibrentasvir (MAVYRET) 100-40 MG TABS Take 3 tablets by mouth daily. 10/27/18   [provider]  glipiZIDE (GLUCOTROL XL) 5 MG 24 hr tablet Take 1 tablet (5  mg total) by mouth daily with breakfast. 12/24/22   Brita Romp, NP  glucose blood (ACCU-CHEK GUIDE) test strip Use as instructed to check blood glucose four times daily 12/01/21   Brita Romp, NP  hyoscyamine (ANASPAZ) 0.125 MG TBDP disintergrating tablet TAKE 1 TABLET UNDER THE TONGUE THREE TIMES A DAY AS NEEDED FOR BLADDER SPASMS OR CRAMPING. 09/16/22   Montez Morita, Quillian Quince, MD  Insulin Pen Needle (GLOBAL EASE INJECT PEN NEEDLES) 31G X 8 MM MISC Use 1 pen needle 4 times daily for insulin. 11/10/21   Brita Romp, NP  Insulin Pen Needle (PEN NEEDLES) 32G X 6 MM MISC 1 each by Does  not apply route 3 (three) times daily. 02/26/21   Brita Romp, NP  insulin regular human CONCENTRATED (HUMULIN R U-500 KWIKPEN) 500 UNIT/ML KwikPen Inject 110 Units into the skin 3 (three) times daily with meals. 12/24/22   Brita Romp, NP  LINZESS 290 MCG CAPS capsule Take 290 mcg by mouth daily. 10/16/22   [provider]  liraglutide (VICTOZA) 18 MG/3ML SOPN Inject into the skin. 06/12/20   [provider]  lisinopril (ZESTRIL) 5 MG tablet TAKE 1 TABLET ONCE DAILY. 09/16/22   Brita Romp, NP  lubiprostone (AMITIZA) 24 MCG capsule Take 1 capsule (24 mcg total) by mouth 2 (two) times daily with a meal. 09/02/22   Montez Morita, Quillian Quince, MD  menthol-cetylpyridinium (CEPACOL) 3 MG lozenge Take 1 lozenge (3 mg total) by mouth as needed for sore throat. 07/19/22   Jeanell Sparrow, DO  metFORMIN (GLUCOPHAGE) 1000 MG tablet Take 1 tablet (1,000 mg total) by mouth 2 (two) times daily with a meal. 12/24/22   Brita Romp, NP  methadone (DOLOPHINE) 10 MG/5ML solution Take 60 mg by mouth every morning.    [provider]  methocarbamol (ROBAXIN) 500 MG tablet Take by mouth at bedtime. 05/11/22   [provider]  Multiple Vitamin (MULTIVITAMIN) tablet Take 1 tablet by mouth daily.    [provider]  naloxegol oxalate (MOVANTIK) 25 MG TABS tablet Take 1 tablet (25 mg total) by mouth daily. 05/18/22   Harvel Quale, MD  nystatin (MYCOSTATIN/NYSTOP) powder Apply 1 application topically 3 (three) times daily. Patient taking differently: Apply 1 application  topically 3 (three) times daily as needed (irritation). 05/19/21   Mesner, Corene Cornea, MD  omeprazole (PRILOSEC) 40 MG capsule TAKE 1 CAPSULE BY MOUTH TWICE DAILY. 05/28/22   Harvel Quale, MD  phenazopyridine (PYRIDIUM) 100 MG tablet Take by mouth. 09/02/22   [provider]  polyethylene glycol (MIRALAX / GLYCOLAX) 17 g packet Take by mouth. 10/27/18   [provider]  polyethylene glycol-electrolytes (TRILYTE) 420 g solution Take 4,000 mLs by mouth as directed. 05/18/22   Harvel Quale, MD  potassium chloride (KLOR-CON M) 10 MEQ tablet Take 10 mEq by mouth 2 (two) times daily. 10/15/21   [provider]  pregabalin (LYRICA) 75 MG capsule Take 75 mg by mouth in the morning, at noon, in the evening, and at bedtime.     [provider]  prochlorperazine (COMPAZINE) 5 MG tablet Take by mouth. 07/14/18   [provider]  promethazine (PHENERGAN) 12.5 MG tablet Take 1 tablet (12.5 mg total) by mouth every 8 (eight) hours as needed for nausea or vomiting. 02/16/22   Harvel Quale, MD  propranolol (INDERAL) 10 MG tablet Take 10 mg by mouth 3 (three) times daily. 08/08/20   [provider]  ranitidine (ZANTAC) 75 MG tablet Take by mouth. 05/01/18   [provider]  RESTASIS 0.05 % ophthalmic emulsion Place 1 drop into both eyes as needed (dry eye).  04/22/19   [provider]  rosuvastatin (CRESTOR) 5 MG tablet Take 1 tablet (5 mg total) by mouth daily. 08/25/21   Brita Romp, NP  spironolactone (ALDACTONE) 100 MG tablet Take 100 mg by mouth daily.  05/22/20   [provider]  sucralfate (CARAFATE) 1 GM/10ML suspension Take 10 mLs (1 g total) by mouth 4 (four) times daily -  with meals and at bedtime. 10/23/21   Carlan, Chelsea L, NP  Suvorexant (BELSOMRA) 20 MG TABS Take by mouth. 05/01/18   [provider]  tetrahydrozoline 0.05 % ophthalmic solution 2 drops 2 (two) times a day as needed. 05/01/18   [provider]  tirzepatide Darcel Bayley) 7.5 MG/0.5ML Pen Inject 7.5 mg into the skin once a week. 12/24/22   Brita Romp, NP  triamcinolone (KENALOG) 0.1 % Apply 1 application topically daily as needed (irritation). 02/04/21   [provider]  venlafaxine XR (EFFEXOR-XR) 150 MG 24 hr capsule Take total of 225 mg daily (150 mg + 75 mg ) 01/24/21    Norman Clay, MD  venlafaxine XR (EFFEXOR-XR) 75 MG 24 hr capsule To take along with '150mg'$  for a total of 225 mg daily 01/20/21   Norman Clay, MD  Vitamin D, Ergocalciferol, (DRISDOL) 1.25 MG (50000 UNIT) CAPS capsule Take 1 capsule (50,000 Units total) by mouth every 7 (seven) days. Patient taking differently: Take 50,000 Units by mouth every Tuesday. 12/10/21   Brita Romp, NP  vortioxetine HBr (TRINTELLIX) 20 MG TABS tablet Take by mouth. 05/01/18   [provider]      Allergies    Iodine-131, Ivp dye [iodinated contrast media], Ketorolac tromethamine, Tylenol [acetaminophen], Vancomycin, Gabapentin, Nsaids, and Suboxone [buprenorphine hcl-naloxone hcl]    Review of Systems   Review of Systems  Constitutional:  Negative for fever.  HENT:  Negative for sore throat.   Respiratory:  Negative for cough and shortness of breath.   Cardiovascular:  Positive for chest pain.  Gastrointestinal:  Positive for abdominal pain, diarrhea, nausea and vomiting. Negative for constipation.  Genitourinary:  Negative for dysuria.  Skin:  Negative for rash.    Physical Exam Updated Vital Signs BP 137/82   Pulse (!) 114   Temp 98.7 F (37.1 C) (Oral)   Resp 18   Ht '5\' 9"'$  (1.753 m)   Wt 113.4 kg   SpO2 98%   BMI 36.92 kg/m  Physical Exam Vitals and nursing note reviewed.  Constitutional:      General: She is not in acute distress.    Appearance: Normal appearance. She is well-developed. She is obese.  HENT:     Head: Normocephalic and atraumatic.  Eyes:     Conjunctiva/sclera: Conjunctivae normal.  Cardiovascular:     Rate and Rhythm: Regular rhythm. Tachycardia present.     Heart sounds: No murmur heard. Pulmonary:     Effort: Pulmonary effort is normal. No respiratory distress.     Breath sounds: Normal breath sounds.  Abdominal:     Palpations: Abdomen is soft.     Tenderness: There is no abdominal tenderness. There is no guarding or rebound.  Musculoskeletal:         General: No deformity.     Cervical back: Neck supple.  Skin:    General: Skin is warm and dry.  Capillary Refill: Capillary refill takes less than 2 seconds.  Neurological:     General: No focal deficit present.     Mental Status: She is alert.     ED Results / Procedures / Treatments   Labs (all labs ordered are listed, but only abnormal results are displayed) Labs Reviewed  COMPREHENSIVE METABOLIC PANEL - Abnormal; Notable for the following components:      Result Value   CO2 20 (*)    Glucose, Bld 263 (*)    Calcium 7.7 (*)    Total Protein 6.4 (*)    Albumin 3.0 (*)    AST 45 (*)    All other components within normal limits  CBC - Abnormal; Notable for the following components:   Platelets 61 (*)    All other components within normal limits  URINALYSIS, ROUTINE W REFLEX MICROSCOPIC - Abnormal; Notable for the following components:   APPearance HAZY (*)    Glucose, UA >=500 (*)    Nitrite POSITIVE (*)    Bacteria, UA MANY (*)    All other components within normal limits  CBG MONITORING, ED - Abnormal; Notable for the following components:   Glucose-Capillary 267 (*)    All other components within normal limits  LIPASE, BLOOD  POC URINE PREG, ED    EKG None  Radiology No results found.  Procedures Procedures  {Document cardiac monitor, telemetry assessment procedure when appropriate:1}  Medications Ordered in ED Medications  promethazine (PHENERGAN) 25 mg in sodium chloride 0.9 % 50 mL IVPB (has no administration in time range)  fentaNYL (SUBLIMAZE) injection 100 mcg (has no administration in time range)  sodium chloride 0.9 % bolus 500 mL (has no administration in time range)  pantoprazole (PROTONIX) injection 40 mg (has no administration in time range)    ED Course/ Medical Decision Making/ A&P   {   Click here for ABCD2, HEART and other calculatorsREFRESH Note before signing :1}                          Medical Decision Making Amount and/or  Complexity of Data Reviewed Labs: ordered.  Risk Prescription drug management.   This patient complains of ***; this involves an extensive number of treatment Options and is a complaint that carries with it a high risk of complications and morbidity. The differential includes ***  I ordered, reviewed and interpreted labs, which included *** I ordered medication *** and reviewed PMP when indicated. I ordered imaging studies which included *** and I independently    visualized and interpreted imaging which showed *** Additional history obtained from *** Previous records obtained and reviewed *** I consulted *** and discussed lab and imaging findings and discussed disposition.  Cardiac monitoring reviewed, *** Social determinants considered, *** Critical Interventions: ***  After the interventions stated above, I reevaluated the patient and found *** Admission and further testing considered, ***   {Document critical care time when appropriate:1} {Document review of labs and clinical decision tools ie heart score, Chads2Vasc2 etc:1}  {Document your independent review of radiology images, and any outside records:1} {Document your discussion with family members, caretakers, and with consultants:1} {Document social determinants of health affecting pt's care:1} {Document your decision making why or why not admission, treatments were needed:1} Final Clinical Impression(s) / ED Diagnoses Final diagnoses:  None    Rx / DC Orders ED Discharge Orders     None

## 2023-01-04 NOTE — Discharge Instructions (Addendum)
You are seen in the emergency department for chest and upper abdominal pain nausea vomiting diarrhea.  You had lab work that was unremarkable along with EKG chest x-ray.  Please continue your regular medications and follow-up with your GI team.  Return to the emergency department if any worsening or concerning symptoms

## 2023-01-04 NOTE — ED Triage Notes (Signed)
Pt c/o abdominal pain that started today. Endorses some chest pain, nausea, diarrhea, vomiting.

## 2023-01-05 ENCOUNTER — Telehealth: Payer: Self-pay

## 2023-01-05 NOTE — Progress Notes (Signed)
   Care Guide Note  01/05/2023 Name: Jaime [name] MRN: [id number] DOB: 04-15-1986  Referred by: Juanda Chance, FNP Reason for referral : Care Coordination (Outreach to schedule referral with Pharm d )   Jaime [name] is a 37 y.o. year old female who is a primary care patient of Del Eli Hose, FNP. Jaime [name] was referred to the pharmacist for assistance related to DM.    An unsuccessful telephone outreach was attempted today to contact the patient who was referred to the pharmacy team for assistance with medication assistance. Additional attempts will be made to contact the patient.   [name], [place], [place] [address] Direct Dial: [phone] [email or web address]

## 2023-01-06 ENCOUNTER — Other Ambulatory Visit: Payer: Self-pay | Admitting: Family Medicine

## 2023-01-06 DIAGNOSIS — D696 Thrombocytopenia, unspecified: Secondary | ICD-10-CM

## 2023-01-07 ENCOUNTER — Emergency Department (HOSPITAL_COMMUNITY): Payer: Medicaid Other

## 2023-01-07 ENCOUNTER — Encounter (HOSPITAL_COMMUNITY): Payer: Self-pay | Admitting: Emergency Medicine

## 2023-01-07 ENCOUNTER — Emergency Department (HOSPITAL_COMMUNITY)
Admission: EM | Admit: 2023-01-07 | Discharge: 2023-01-07 | Disposition: A | Discharge status: Home / Self Care | Payer: Medicaid Other | Attending: Emergency Medicine | Admitting: Emergency Medicine

## 2023-01-07 ENCOUNTER — Other Ambulatory Visit: Payer: Self-pay

## 2023-01-07 DIAGNOSIS — Z8719 Personal history of other diseases of the digestive system: Secondary | ICD-10-CM | POA: Insufficient documentation

## 2023-01-07 DIAGNOSIS — Z794 Long term (current) use of insulin: Secondary | ICD-10-CM | POA: Diagnosis not present

## 2023-01-07 DIAGNOSIS — Z7982 Long term (current) use of aspirin: Secondary | ICD-10-CM | POA: Diagnosis not present

## 2023-01-07 DIAGNOSIS — N3000 Acute cystitis without hematuria: Secondary | ICD-10-CM | POA: Diagnosis not present

## 2023-01-07 DIAGNOSIS — K2970 Gastritis, unspecified, without bleeding: Secondary | ICD-10-CM | POA: Diagnosis not present

## 2023-01-07 DIAGNOSIS — R1013 Epigastric pain: Secondary | ICD-10-CM | POA: Diagnosis present

## 2023-01-07 LAB — LIPASE, BLOOD: Lipase: 31 U/L (ref 11–51)

## 2023-01-07 LAB — CBC
HCT: 38.3 % (ref 36.0–46.0)
Hemoglobin: 12.4 g/dL (ref 12.0–15.0)
MCH: 30.5 pg (ref 26.0–34.0)
MCHC: 32.4 g/dL (ref 30.0–36.0)
MCV: 94.3 fL (ref 80.0–100.0)
Platelets: 52 10*3/uL — ABNORMAL LOW (ref 150–400)
RBC: 4.06 MIL/uL (ref 3.87–5.11)
RDW: 13.5 % (ref 11.5–15.5)
WBC: 2.9 10*3/uL — ABNORMAL LOW (ref 4.0–10.5)
nRBC: 0 % (ref 0.0–0.2)

## 2023-01-07 LAB — URINALYSIS, ROUTINE W REFLEX MICROSCOPIC
Bilirubin Urine: NEGATIVE
Glucose, UA: NEGATIVE mg/dL
Ketones, ur: NEGATIVE mg/dL
Nitrite: POSITIVE — AB
Protein, ur: NEGATIVE mg/dL
Specific Gravity, Urine: 1.01 (ref 1.005–1.030)
pH: 7 (ref 5.0–8.0)

## 2023-01-07 LAB — COMPREHENSIVE METABOLIC PANEL
ALT: 26 U/L (ref 0–44)
AST: 39 U/L (ref 15–41)
Albumin: 2.6 g/dL — ABNORMAL LOW (ref 3.5–5.0)
Alkaline Phosphatase: 113 U/L (ref 38–126)
Anion gap: 7 (ref 5–15)
BUN: 7 mg/dL (ref 6–20)
CO2: 23 mmol/L (ref 22–32)
Calcium: 8.1 mg/dL — ABNORMAL LOW (ref 8.9–10.3)
Chloride: 109 mmol/L (ref 98–111)
Creatinine, Ser: 0.53 mg/dL (ref 0.44–1.00)
GFR, Estimated: >60 mL/min (ref >60)
Glucose, Bld: 86 mg/dL (ref 70–99)
Potassium: 3.8 mmol/L (ref 3.5–5.1)
Sodium: 139 mmol/L (ref 135–145)
Total Bilirubin: 0.8 mg/dL (ref 0.3–1.2)
Total Protein: 5.8 g/dL — ABNORMAL LOW (ref 6.5–8.1)

## 2023-01-07 LAB — URINALYSIS, MICROSCOPIC (REFLEX)

## 2023-01-07 LAB — PREGNANCY, URINE: Preg Test, Ur: NEGATIVE

## 2023-01-07 MED ORDER — ALUM & MAG HYDROXIDE-SIMETH 200-200-20 MG/5ML PO SUSP
30.0000 mL | Freq: Once | ORAL | Status: AC
  Administered 2023-01-07: 30 mL via ORAL
  Filled 2023-01-07: qty 30

## 2023-01-07 MED ORDER — NITROFURANTOIN MONOHYD MACRO 100 MG PO CAPS: 100.0000 mg | ORAL_CAPSULE | Freq: Two times a day (BID) | ORAL | 0 refills | Status: DC

## 2023-01-07 MED ORDER — LIDOCAINE VISCOUS HCL 2 % MT SOLN
15.0000 mL | Freq: Once | OROMUCOSAL | Status: AC
  Administered 2023-01-07: 15 mL via ORAL
  Filled 2023-01-07: qty 15

## 2023-01-07 MED ORDER — FAMOTIDINE 20 MG PO TABS
40.0000 mg | ORAL_TABLET | ORAL | Status: AC
  Administered 2023-01-07: 40 mg via ORAL
  Filled 2023-01-07: qty 2

## 2023-01-07 MED ORDER — ONDANSETRON 4 MG PO TBDP: 4.0000 mg | ORAL_TABLET | Freq: Three times a day (TID) | ORAL | 0 refills | Status: DC | PRN

## 2023-01-07 MED ORDER — FAMOTIDINE 20 MG PO TABS: 20.0000 mg | ORAL_TABLET | Freq: Two times a day (BID) | ORAL | 0 refills | Status: DC

## 2023-01-07 MED ORDER — OXYCODONE HCL 5 MG PO TABS: 5.0000 mg | ORAL_TABLET | ORAL | 0 refills | Status: DC | PRN

## 2023-01-07 MED ORDER — OXYCODONE HCL 5 MG PO TABS
10.0000 mg | ORAL_TABLET | ORAL | Status: AC
  Administered 2023-01-07: 10 mg via ORAL
  Filled 2023-01-07: qty 2

## 2023-01-07 MED ORDER — ONDANSETRON 4 MG PO TBDP
4.0000 mg | ORAL_TABLET | Freq: Once | ORAL | Status: AC
  Administered 2023-01-07: 4 mg via ORAL
  Filled 2023-01-07: qty 1

## 2023-01-07 NOTE — ED Provider Notes (Signed)
[facility] Provider Note   CSN: [id number] Arrival date & time: 01/07/23  0547     History  Chief Complaint  Patient presents with   Abdominal Pain    Jaime [name] is a 37 y.o. female.  37 year old female with a history of ulcers and cirrhosis presents with epigastric abdominal pain and diarrhea.  Diarrhea is green.  Has had nausea without vomiting.  Has an appointment with her doctor tomorrow.  No heavy NSAID use.  No recent heavy alcohol use.  Has been weaning her methadone but no significant changes and has not experienced withdrawals during this.  Had a cholecystectomy.  Pain worsened by eating.  Woke her up last night so they decided to come in.       Home Medications Prior to Admission medications   Medication Sig Start Date End Date Taking? Authorizing Provider  albuterol (VENTOLIN HFA) 108 (90 Base) MCG/ACT inhaler Inhale 2 puffs into the lungs every 6 (six) hours as needed for wheezing or shortness of breath. 07/17/22  Yes Leath-Warren, Alda Lea, NP  ARIPiprazole (ABILIFY) 20 MG tablet Take 20 mg by mouth daily. 10/15/21  Yes [provider]  aspirin EC 81 MG tablet Take 81 mg by mouth daily. Swallow whole.   Yes [provider]  cetirizine (ZYRTEC ALLERGY) 10 MG tablet Take 1 tablet (10 mg total) by mouth daily. 07/09/20  Yes Avegno, Darrelyn Hillock, FNP  clonazePAM (KLONOPIN) 0.5 MG tablet Take 0.5 mg by mouth 2 (two) times daily.   Yes [provider]  etonogestrel (NEXPLANON) 68 MG IMPL implant 1 each by Subdermal route once.   Yes [provider]  famotidine (PEPCID) 20 MG tablet Take 1 tablet (20 mg total) by mouth 2 (two) times daily. 01/07/23  Yes Fransico Meadow, MD  fluticasone [facility]) 50 MCG/ACT nasal spray Place 2 sprays into both nostrils daily. 07/17/22  Yes Leath-Warren, Alda Lea, NP  furosemide (LASIX) 40 MG tablet Take 40 mg by mouth daily. 05/22/20  Yes [provider]  Glecaprevir-Pibrentasvir (MAVYRET) 100-40 MG TABS Take 3 tablets by mouth daily. 10/27/18  Yes [provider]  glipiZIDE (GLUCOTROL XL) 5 MG 24 hr tablet Take 1 tablet (5 mg total) by mouth daily with breakfast. 12/24/22  Yes Reardon, Loree Fee J, NP  insulin regular human CONCENTRATED (HUMULIN R U-500 KWIKPEN) 500 UNIT/ML KwikPen Inject 110 Units into the skin 3 (three) times daily with meals. 12/24/22  Yes Brita Romp, NP  LINZESS 290 MCG CAPS capsule Take 290 mcg by mouth daily. 10/16/22  Yes [provider]  liraglutide (VICTOZA) 18 MG/3ML SOPN Inject 1.8 mg into the skin daily. 06/12/20  Yes [provider]  lisinopril (ZESTRIL) 5 MG tablet TAKE 1 TABLET ONCE DAILY. 09/16/22  Yes Brita Romp, NP  lubiprostone (AMITIZA) 24 MCG capsule Take 1 capsule (24 mcg total) by mouth 2 (two) times daily with a meal. 09/02/22  Yes Montez Morita, Quillian Quince, MD  metFORMIN (GLUCOPHAGE) 1000 MG tablet Take 1 tablet (1,000 mg total) by mouth 2 (two) times daily with a meal. 12/24/22  Yes Brita Romp, NP  methadone (DOLOPHINE) 10 MG/5ML solution Take 3 mLs by mouth every morning.   Yes [provider]  methocarbamol (ROBAXIN) 500 MG tablet Take 500 mg by mouth at bedtime. 05/11/22  Yes [provider]  Multiple Vitamin (MULTIVITAMIN) tablet Take 1 tablet by mouth daily.   Yes [provider]  naloxegol oxalate (  MOVANTIK) 25 MG TABS tablet Take 1 tablet (25 mg total) by mouth daily. 05/18/22  Yes Harvel Quale, MD  nitrofurantoin, macrocrystal-monohydrate, (MACROBID) 100 MG capsule Take 1 capsule (100 mg total) by mouth 2 (two) times daily. 01/07/23  Yes Fransico Meadow, MD  ondansetron (ZOFRAN-ODT) 4 MG disintegrating tablet Take 1 tablet (4 mg total) by mouth every 8 (eight) hours as needed for nausea or vomiting. 01/07/23  Yes Fransico Meadow, MD  oxyCODONE (ROXICODONE) 5 MG immediate release tablet Take 1 tablet (5 mg total) by  mouth every 4 (four) hours as needed for severe pain. 01/07/23  Yes Fransico Meadow, MD  polyethylene glycol (MIRALAX / GLYCOLAX) 17 g packet Take 17 g by mouth daily as needed for mild constipation. 10/27/18  Yes [provider]  potassium chloride (KLOR-CON M) 10 MEQ tablet Take 10 mEq by mouth 2 (two) times daily. 10/15/21  Yes [provider]  pregabalin (LYRICA) 75 MG capsule Take 75 mg by mouth in the morning, at noon, in the evening, and at bedtime.    Yes [provider]  propranolol (INDERAL) 10 MG tablet Take 10 mg by mouth 3 (three) times daily. 08/08/20  Yes [provider]  RESTASIS 0.05 % ophthalmic emulsion Place 1 drop into both eyes as needed (dry eye).  04/22/19  Yes [provider]  rosuvastatin (CRESTOR) 5 MG tablet Take 1 tablet (5 mg total) by mouth daily. 08/25/21  Yes Brita Romp, NP  spironolactone (ALDACTONE) 100 MG tablet Take 100 mg by mouth daily.  05/22/20  Yes [provider]  Suvorexant (BELSOMRA) 20 MG TABS Take 20 mg by mouth at bedtime. 05/01/18  Yes [provider]  tirzepatide Darcel Bayley) 7.5 MG/0.5ML Pen Inject 7.5 mg into the skin once a week. 12/24/22  Yes Reardon, Juanetta Beets, NP  triamcinolone (KENALOG) 0.1 % Apply 1 application topically daily as needed (irritation). 02/04/21  Yes [provider]  venlafaxine XR (EFFEXOR-XR) 150 MG 24 hr capsule Take total of 225 mg daily (150 mg + 75 mg ) 01/24/21  Yes Hisada, Elie Goody, MD  venlafaxine XR (EFFEXOR-XR) 75 MG 24 hr capsule To take along with 150mg  for a total of 225 mg daily 01/20/21  Yes Hisada, Elie Goody, MD  Vitamin D, Ergocalciferol, (DRISDOL) 1.25 MG (50000 UNIT) CAPS capsule Take 1 capsule (50,000 Units total) by mouth every 7 (seven) days. Patient taking differently: Take 50,000 Units by mouth every Tuesday. 12/10/21  Yes Reardon, Juanetta Beets, NP  vortioxetine HBr (TRINTELLIX) 20 MG TABS tablet Take 20 mg by mouth daily. 05/01/18  Yes [provider]  Accu-Chek Softclix Lancets lancets USE TO TEST BLOOD SUGAR 4 TIMES A DAY. 05/28/22   Brita Romp, NP  cephALEXin (KEFLEX) 500 MG capsule Take 1 capsule (500 mg total) by mouth 4 (four) times daily. Patient not taking: Reported on 01/07/2023 01/04/23   Hayden Rasmussen, MD  Continuous Blood Gluc Sensor (DEXCOM G6 SENSOR) MISC Change sensor every 10 days. 12/24/22   Brita Romp, NP  Continuous Blood Gluc Transmit (DEXCOM G6 TRANSMITTER) MISC USE AS DIRECTED. 12/24/22   Brita Romp, NP  cyclobenzaprine (FLEXERIL) 5 MG tablet Take 1 tablet (5 mg total) by mouth 3 (three) times daily as needed for muscle spasms. Patient not taking: Reported on 01/07/2023 01/01/23   Del Eli Hose, FNP  dicyclomine (BENTYL) 20 MG tablet Take 1 tablet (20 mg total) by mouth every 6 (six) hours. 01/08/23  Del Ria Comment, Lock[place], FNP  glucose blood (ACCU-CHEK GUIDE) test strip Use as instructed to check blood glucose four times daily 12/01/21   Brita Romp, NP  Insulin Pen Needle (GLOBAL EASE INJECT PEN NEEDLES) 31G X 8 MM MISC Use 1 pen needle 4 times daily for insulin. 11/10/21   Brita Romp, NP  Insulin Pen Needle (PEN NEEDLES) 32G X 6 MM MISC 1 each by Does not apply route 3 (three) times daily. 02/26/21   Brita Romp, NP  loperamide (IMODIUM A-D) 2 MG tablet Take 1 tablet (2 mg total) by mouth 4 (four) times daily as needed for diarrhea or loose stools. 01/08/23   Del Eli Hose, FNP  menthol-cetylpyridinium (CEPACOL) 3 MG lozenge Take 1 lozenge (3 mg total) by mouth as needed for sore throat. Patient not taking: Reported on 01/07/2023 07/19/22   Wynona Dove A, DO  nystatin (MYCOSTATIN/NYSTOP) powder Apply 1 application topically 3 (three) times daily. Patient not taking: Reported on 01/07/2023 05/19/21   Mesner, Corene Cornea, MD  pantoprazole (PROTONIX) 20 MG tablet Take 1 tablet (20 mg total) by mouth daily. 01/08/23   Del Eli Hose, FNP  polyethylene  glycol-electrolytes (TRILYTE) 420 g solution Take 4,000 mLs by mouth as directed. Patient not taking: Reported on 01/07/2023 05/18/22   Harvel Quale, MD  promethazine (PHENERGAN) 12.5 MG tablet Take 1 tablet (12.5 mg total) by mouth every 8 (eight) hours as needed for nausea or vomiting. Patient not taking: Reported on 01/07/2023 02/16/22   Harvel Quale, MD  sucralfate (CARAFATE) 1 GM/10ML suspension Take 10 mLs (1 g total) by mouth 4 (four) times daily -  with meals and at bedtime. Patient not taking: Reported on 01/07/2023 10/23/21   Gabriel Rung, NP      Allergies    Iodine-131, Ivp dye [iodinated contrast media], Ketorolac tromethamine, Tylenol [acetaminophen], Vancomycin, Gabapentin, Nsaids, and Suboxone [buprenorphine hcl-naloxone hcl]    Review of Systems   Review of Systems  Physical Exam Updated Vital Signs BP 125/86 (BP Location: Right Arm)   Pulse (!) 102   Temp 98.2 F (36.8 C)   Resp 18   Ht 5\' 9"  (1.753 m)   Wt 113.4 kg   SpO2 98%   BMI 36.92 kg/m  Physical Exam Vitals and nursing note reviewed.  Constitutional:      General: She is not in acute distress.    Appearance: She is well-developed.  HENT:     Head: Normocephalic and atraumatic.     Right Ear: External ear normal.     Left Ear: External ear normal.     Nose: Nose normal.  Eyes:     Extraocular Movements: Extraocular movements intact.     Conjunctiva/sclera: Conjunctivae normal.     Pupils: Pupils are equal, round, and reactive to light.  Cardiovascular:     Rate and Rhythm: Normal rate and regular rhythm.     Heart sounds: No murmur heard. Pulmonary:     Effort: Pulmonary effort is normal. No respiratory distress.     Breath sounds: Normal breath sounds.  Abdominal:     General: Abdomen is flat. There is no distension.     Palpations: Abdomen is soft.     Tenderness: There is abdominal tenderness (Epigastrium). There is no guarding.     Comments: Ventral hernia that is  reducible  Musculoskeletal:     Cervical back: Normal range of motion and neck supple.     Right lower leg:  No edema.     Left lower leg: No edema.  Skin:    General: Skin is warm and dry.  Neurological:     Mental Status: She is alert and oriented to person, place, and time. Mental status is at baseline.  Psychiatric:        Mood and Affect: Mood normal.     ED Results / Procedures / Treatments   Labs (all labs ordered are listed, but only abnormal results are displayed) Labs Reviewed  COMPREHENSIVE METABOLIC PANEL - Abnormal; Notable for the following components:      Result Value   Calcium 8.1 (*)    Total Protein 5.8 (*)    Albumin 2.6 (*)    All other components within normal limits  CBC - Abnormal; Notable for the following components:   WBC 2.9 (*)    Platelets 52 (*)    All other components within normal limits  URINALYSIS, ROUTINE W REFLEX MICROSCOPIC - Abnormal; Notable for the following components:   APPearance HAZY (*)    Hgb urine dipstick TRACE (*)    Nitrite POSITIVE (*)    Leukocytes,Ua TRACE (*)    All other components within normal limits  URINALYSIS, MICROSCOPIC (REFLEX) - Abnormal; Notable for the following components:   Bacteria, UA MANY (*)    All other components within normal limits  LIPASE, BLOOD  PREGNANCY, URINE    EKG None  Radiology No results found.  Procedures Procedures   Medications Ordered in ED Medications  famotidine (PEPCID) tablet 40 mg (40 mg Oral Given 01/07/23 0749)  alum & mag hydroxide-simeth (MAALOX/MYLANTA) 200-200-20 MG/5ML suspension 30 mL (30 mLs Oral Given 01/07/23 0750)    And  lidocaine (XYLOCAINE) 2 % viscous mouth solution 15 mL (15 mLs Oral Given 01/07/23 0750)  oxyCODONE (Oxy IR/ROXICODONE) immediate release tablet 10 mg (10 mg Oral Given 01/07/23 0901)  ondansetron (ZOFRAN-ODT) disintegrating tablet 4 mg (4 mg Oral Given 01/07/23 0901)    ED Course/ Medical Decision Making/ A&P Clinical Course as of 01/10/23  1150  Thu Jan 07, 2023  0825 IMPRESSION: 1. Increased distal esophageal thickening. Endoscopic follow-up suggested. 2. Increased mesenteric edema which could be due to generalized mesenteric congestive changes. 3. Some of the edema is around the pancreas. Correlate clinically for underlying pancreatitis. 4. Cirrhotic liver with splenomegaly, splenorenal varices, prominence of the portal main vein and recanalized umbilical vein. 5. Increased mild-to-moderate free ascites. 6. Increased edema in the abdominal wall pannus subcutaneously which could be congestive or due to cellulitis. 7. Increased thickening of the stomach which could be due to gastritis or portal gastropathy. 8. Osteopenia with interval new mild upper plate anterior wedge compression fracture of the L5 vertebral body. Age is indeterminate but this was not present on 08/09/2022. 9. Diverticulosis without evidence of diverticulitis. 10. Aortic atherosclerosis.   [RP]    Clinical Course User Index [RP] Fransico Meadow, MD                             Medical Decision Making Amount and/or Complexity of Data Reviewed Labs: ordered. Radiology: ordered.  Risk OTC drugs. Prescription drug management.   Jaime [name] is a 37 y.o. 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  Does not have diffuse tenderness palpation suggestive of SBP.  Since this is her second visit to the emergency department will obtain CT scan to evaluate for any intra-abdominal abscess or other acute surgical process but feel this is highly unlikely.  Does not appear to be in frank opioid withdrawal  but some of her GI symptoms may be related to the fact that they are weaning her methadone.  Plan:  Labs Lipase Urine pregnancy  ED Summary/Re-evaluation:  Patient underwent the following workup which showed possible gastritis.  Did also show possible pancreatitis but this was compounded by the fact that she did have some ascites in her abdomen.  Patient found to have lumbar spine fracture which she was already aware of.  Was feeling better after GI cocktail.  Patient was given an acids to go home with and also instructed to talk to her methadone clinic about her methadone wean since this may be related to her symptoms.  Also given instructions to follow-up with GI.  Urinalysis sent from triage did show possible UTI so we will give her antibiotics at this time.  This patient presents to the ED for concern of complaints listed in HPI, this involves an extensive number of treatment options, and is a complaint that carries with it a high risk of complications and morbidity. Disposition including potential need for admission considered.   Dispo: DC Home. Return precautions discussed including, but not limited to, those listed in the AVS. Allowed pt time to ask questions which were answered fully prior to dc.  Additional history obtained from significant other Records reviewed ED Visit Notes The following labs were independently interpreted: Urinalysis and show urinary tract infection I independently reviewed the following imaging with scope of interpretation limited to determining acute life threatening conditions related to emergency care: CT Abdomen/Pelvis and agree with the radiologist interpretation with the following exceptions: None I personally reviewed and interpreted cardiac monitoring: normal sinus rhythm  I personally reviewed and interpreted the pt's EKG: see above for interpretation  I have reviewed the patients home medications and made adjustments as needed  Final Clinical  Impression(s) / ED Diagnoses Final diagnoses:  Acute cystitis without hematuria  Gastritis without bleeding, unspecified chronicity, unspecified gastritis type  History of cirrhosis    Rx / DC Orders ED Discharge Orders          Ordered    nitrofurantoin, macrocrystal-monohydrate, (MACROBID) 100 MG capsule  2 times daily        01/07/23 0951    famotidine (PEPCID) 20 MG tablet  2 times daily        01/07/23 0951    oxyCODONE (ROXICODONE) 5 MG immediate release tablet  Every 4 hours PRN        01/07/23 0951    ondansetron (ZOFRAN-ODT) 4 MG disintegrating tablet  Every 8 hours PRN        01/07/23 0951              Fransico Meadow, MD 01/10/23 1150

## 2023-01-07 NOTE — ED Triage Notes (Signed)
Pt c/o abd pain and N/V/D. Pt seen for same earlier this week. Pt states she has follow up appt for same with PCP on Friday.

## 2023-01-07 NOTE — Discharge Instructions (Signed)
You were seen for abdominal pain in the emergency department.   At home, please take the Pepcid we have prescribed you for your pain as well as Zofran for any nausea that you have.  If you have any persistent pain you may take the oxycodone since you are unable to take Tylenol due to your liver function and NSAIDs may worsen your symptoms.      Check your MyChart online for the results of any tests that had not resulted by the time you left the emergency department.   Follow-up with your primary doctor in 2-3 days regarding your visit.  Follow-up with your GI doctor soon as possible as well.  Return immediately to the emergency department if you experience any of the following: Worsening pain, vomiting despite the medications, or any other concerning symptoms.    Thank you for visiting our Emergency Department. It was a pleasure taking care of you today.

## 2023-01-08 ENCOUNTER — Ambulatory Visit (INDEPENDENT_AMBULATORY_CARE_PROVIDER_SITE_OTHER): Payer: Medicaid Other | Admitting: Family Medicine

## 2023-01-08 ENCOUNTER — Encounter: Payer: Self-pay | Admitting: Family Medicine

## 2023-01-08 VITALS — BP 113/63 | HR 113 | Ht 69.0 in | Wt 270.0 lb

## 2023-01-08 DIAGNOSIS — R1013 Epigastric pain: Secondary | ICD-10-CM

## 2023-01-08 DIAGNOSIS — E119 Type 2 diabetes mellitus without complications: Secondary | ICD-10-CM | POA: Diagnosis not present

## 2023-01-08 MED ORDER — PANTOPRAZOLE SODIUM 20 MG PO TBEC: 20.0000 mg | DELAYED_RELEASE_TABLET | Freq: Every day | ORAL | 2 refills | Status: DC

## 2023-01-08 MED ORDER — LOPERAMIDE HCL 2 MG PO TABS: 2.0000 mg | ORAL_TABLET | Freq: Four times a day (QID) | ORAL | 0 refills | Status: DC | PRN

## 2023-01-08 MED ORDER — DICYCLOMINE HCL 20 MG PO TABS: 20.0000 mg | ORAL_TABLET | Freq: Four times a day (QID) | ORAL | 1 refills | Status: DC

## 2023-01-08 NOTE — Patient Instructions (Addendum)
It was pleasure meeting with you today. Please take medications as prescribed. Follow up with your primary health provider if any health concerns arises. If symptoms worsen please contact your primary care provider and/or visit the emergency department.  Gastroenterology referral placed Please follow up with Hematology on 3/14 Please follow up with Endocrinology on  5/22

## 2023-01-08 NOTE — Progress Notes (Signed)
Patient Office Visit   Subjective   Patient ID: Jaime [name], female    DOB: Feb 06, 1986  Age: 37 y.o. MRN: [id number]  CC:  Chief Complaint  Patient presents with   Follow-up    ER follow up for stomach pains, nauseous and diarrhea.    HPI Jaime [name] 37 year old female, presents to clinic for abdominal pain. She  has a past medical history of Anxiety, Asthma, Chronic abdominal pain, Chronic back pain, Cirrhosis ([place]), Cirrhosis ([place]), COPD (chronic obstructive pulmonary disease) ([place]), Depression, Diabetes mellitus without complication ([place]), Diabetes mellitus, type II ([place]), GERD (gastroesophageal reflux disease), Hepatitis C, Hyperlipidemia, Insomnia, Long-term current use of methadone for opiate dependence ([place]), Lupus ([place]), Migraine headache, Neuropathy, Nocturnal seizures ([place]), Peptic ulcer, and Spleen enlarged.  Abdominal Pain This is a chronic problem. The problem occurs constantly. The problem has been gradually worsening. The pain is located in the epigastric region. The pain is at a severity of 9/10. The pain is moderate. The quality of the pain is aching and sharp. The abdominal pain radiates to the epigastric region. Associated symptoms include belching, diarrhea and nausea. Pertinent negatives include no constipation, fever, melena or vomiting. The pain is aggravated by being still, coughing, movement, eating and drinking alcohol. The pain is relieved by Being still. She has tried oral narcotic analgesics and antacids for the symptoms. The treatment provided moderate relief. Prior diagnostic workup includes CT scan. Her past medical history is significant for pancreatitis.      Outpatient Encounter Medications as of 01/08/2023  Medication Sig   dicyclomine (BENTYL) 20 MG tablet Take 1 tablet (20 mg total) by mouth every 6 (six) hours.   loperamide (IMODIUM A-D) 2 MG tablet Take 1 tablet (2 mg total) by mouth 4 (four) times daily as needed for diarrhea or loose  stools.   pantoprazole (PROTONIX) 20 MG tablet Take 1 tablet (20 mg total) by mouth daily.   Accu-Chek Softclix Lancets lancets USE TO TEST BLOOD SUGAR 4 TIMES A DAY.   albuterol (VENTOLIN HFA) 108 (90 Base) MCG/ACT inhaler Inhale 2 puffs into the lungs every 6 (six) hours as needed for wheezing or shortness of breath.   ARIPiprazole (ABILIFY) 20 MG tablet Take 20 mg by mouth daily.   aspirin EC 81 MG tablet Take 81 mg by mouth daily. Swallow whole.   cephALEXin (KEFLEX) 500 MG capsule Take 1 capsule (500 mg total) by mouth 4 (four) times daily. (Patient not taking: Reported on 01/07/2023)   cetirizine (ZYRTEC ALLERGY) 10 MG tablet Take 1 tablet (10 mg total) by mouth daily.   clonazePAM (KLONOPIN) 0.5 MG tablet Take 0.5 mg by mouth 2 (two) times daily.   Continuous Blood Gluc Sensor (DEXCOM G6 SENSOR) MISC Change sensor every 10 days.   Continuous Blood Gluc Transmit (DEXCOM G6 TRANSMITTER) MISC USE AS DIRECTED.   cyclobenzaprine (FLEXERIL) 5 MG tablet Take 1 tablet (5 mg total) by mouth 3 (three) times daily as needed for muscle spasms. (Patient not taking: Reported on 01/07/2023)   etonogestrel (NEXPLANON) 68 MG IMPL implant 1 each by Subdermal route once.   famotidine (PEPCID) 20 MG tablet Take 1 tablet (20 mg total) by mouth 2 (two) times daily.   fluticasone (FLONASE) 50 MCG/ACT nasal spray Place 2 sprays into both nostrils daily.   furosemide (LASIX) 40 MG tablet Take 40 mg by mouth daily.   Glecaprevir-Pibrentasvir (MAVYRET) 100-40 MG TABS Take 3 tablets by mouth daily.   glipiZIDE (GLUCOTROL XL) 5  MG 24 hr tablet Take 1 tablet (5 mg total) by mouth daily with breakfast.   glucose blood (ACCU-CHEK GUIDE) test strip Use as instructed to check blood glucose four times daily   Insulin Pen Needle (GLOBAL EASE INJECT PEN NEEDLES) 31G X 8 MM MISC Use 1 pen needle 4 times daily for insulin.   Insulin Pen Needle (PEN NEEDLES) 32G X 6 MM MISC 1 each by Does not apply route 3 (three) times daily.    insulin regular human CONCENTRATED (HUMULIN R U-500 KWIKPEN) 500 UNIT/ML KwikPen Inject 110 Units into the skin 3 (three) times daily with meals.   LINZESS 290 MCG CAPS capsule Take 290 mcg by mouth daily.   liraglutide (VICTOZA) 18 MG/3ML SOPN Inject 1.8 mg into the skin daily.   lisinopril (ZESTRIL) 5 MG tablet TAKE 1 TABLET ONCE DAILY.   lubiprostone (AMITIZA) 24 MCG capsule Take 1 capsule (24 mcg total) by mouth 2 (two) times daily with a meal.   menthol-cetylpyridinium (CEPACOL) 3 MG lozenge Take 1 lozenge (3 mg total) by mouth as needed for sore throat. (Patient not taking: Reported on 01/07/2023)   metFORMIN (GLUCOPHAGE) 1000 MG tablet Take 1 tablet (1,000 mg total) by mouth 2 (two) times daily with a meal.   methadone (DOLOPHINE) 10 MG/5ML solution Take 3 mLs by mouth every morning.   methocarbamol (ROBAXIN) 500 MG tablet Take 500 mg by mouth at bedtime.   Multiple Vitamin (MULTIVITAMIN) tablet Take 1 tablet by mouth daily.   naloxegol oxalate (MOVANTIK) 25 MG TABS tablet Take 1 tablet (25 mg total) by mouth daily.   nitrofurantoin, macrocrystal-monohydrate, (MACROBID) 100 MG capsule Take 1 capsule (100 mg total) by mouth 2 (two) times daily.   nystatin (MYCOSTATIN/NYSTOP) powder Apply 1 application topically 3 (three) times daily. (Patient not taking: Reported on 01/07/2023)   ondansetron (ZOFRAN-ODT) 4 MG disintegrating tablet Take 1 tablet (4 mg total) by mouth every 8 (eight) hours as needed for nausea or vomiting.   oxyCODONE (ROXICODONE) 5 MG immediate release tablet Take 1 tablet (5 mg total) by mouth every 4 (four) hours as needed for severe pain.   polyethylene glycol (MIRALAX / GLYCOLAX) 17 g packet Take 17 g by mouth daily as needed for mild constipation.   polyethylene glycol-electrolytes (TRILYTE) 420 g solution Take 4,000 mLs by mouth as directed. (Patient not taking: Reported on 01/07/2023)   potassium chloride (KLOR-CON M) 10 MEQ tablet Take 10 mEq by mouth 2 (two) times daily.    pregabalin (LYRICA) 75 MG capsule Take 75 mg by mouth in the morning, at noon, in the evening, and at bedtime.    promethazine (PHENERGAN) 12.5 MG tablet Take 1 tablet (12.5 mg total) by mouth every 8 (eight) hours as needed for nausea or vomiting. (Patient not taking: Reported on 01/07/2023)   propranolol (INDERAL) 10 MG tablet Take 10 mg by mouth 3 (three) times daily.   RESTASIS 0.05 % ophthalmic emulsion Place 1 drop into both eyes as needed (dry eye).    rosuvastatin (CRESTOR) 5 MG tablet Take 1 tablet (5 mg total) by mouth daily.   spironolactone (ALDACTONE) 100 MG tablet Take 100 mg by mouth daily.    sucralfate (CARAFATE) 1 GM/10ML suspension Take 10 mLs (1 g total) by mouth 4 (four) times daily -  with meals and at bedtime. (Patient not taking: Reported on 01/07/2023)   Suvorexant (BELSOMRA) 20 MG TABS Take 20 mg by mouth at bedtime.   tirzepatide (MOUNJARO) 7.5 MG/0.5ML Pen Inject 7.5 mg  into the skin once a week.   triamcinolone (KENALOG) 0.1 % Apply 1 application topically daily as needed (irritation).   venlafaxine XR (EFFEXOR-XR) 150 MG 24 hr capsule Take total of 225 mg daily (150 mg + 75 mg )   venlafaxine XR (EFFEXOR-XR) 75 MG 24 hr capsule To take along with '150mg'$  for a total of 225 mg daily   Vitamin D, Ergocalciferol, (DRISDOL) 1.25 MG (50000 UNIT) CAPS capsule Take 1 capsule (50,000 Units total) by mouth every 7 (seven) days. (Patient taking differently: Take 50,000 Units by mouth every Tuesday.)   vortioxetine HBr (TRINTELLIX) 20 MG TABS tablet Take 20 mg by mouth daily.   [DISCONTINUED] hyoscyamine (ANASPAZ) 0.125 MG TBDP disintergrating tablet TAKE 1 TABLET UNDER THE TONGUE THREE TIMES A DAY AS NEEDED FOR BLADDER SPASMS OR CRAMPING.   [DISCONTINUED] ranitidine (ZANTAC) 75 MG tablet Take 75 mg by mouth at bedtime.   No facility-administered encounter medications on file as of 01/08/2023.    Past Surgical History:  Procedure Laterality Date   BIOPSY  11/25/2021   Procedure:  BIOPSY;  Surgeon: Harvel Quale, MD;  [facility];  Service: Gastroenterology;;   CHOLECYSTECTOMY     COLONOSCOPY WITH PROPOFOL N/A 11/25/2021   Procedure: COLONOSCOPY WITH PROPOFOL;  Surgeon: Harvel Quale, MD;  [facility];  Service: Gastroenterology;  Laterality: N/A;  805   COLONOSCOPY WITH PROPOFOL N/A 03/31/2022   Procedure: COLONOSCOPY WITH PROPOFOL;  Surgeon: Harvel Quale, MD;  [facility];  Service: Gastroenterology;  Laterality: N/A;  730   ESOPHAGOGASTRODUODENOSCOPY  06/2020   done at [facility], candida in upper esophagus (treated with diflucan), ulcerative esophagitis at GE junction, gastritis in stomach, single ulcer in duodenal bulb, with duodenal mucosa showing no abnormality. No presence of varices   ESOPHAGOGASTRODUODENOSCOPY (EGD) WITH PROPOFOL N/A 11/25/2021   Procedure: ESOPHAGOGASTRODUODENOSCOPY (EGD) WITH PROPOFOL;  Surgeon: Harvel Quale, MD;  [facility];  Service: Gastroenterology;  Laterality: N/A;   ESOPHAGOGASTRODUODENOSCOPY (EGD) WITH PROPOFOL N/A 03/31/2022   Procedure: ESOPHAGOGASTRODUODENOSCOPY (EGD) WITH PROPOFOL;  Surgeon: Harvel Quale, MD;  [facility];  Service: Gastroenterology;  Laterality: N/A;    Review of Systems  Constitutional:  Negative for chills and fever.  Respiratory:  Negative for shortness of breath.   Cardiovascular:  Negative for chest pain.  Gastrointestinal:  Positive for abdominal pain, diarrhea and nausea. Negative for blood in stool, constipation, heartburn, melena and vomiting.      Objective    BP 113/63 (BP Location: Right Arm, Patient Position: Sitting, Cuff Size: Large)   Pulse (!) 113   Ht '5\' 9"'$  (1.753 m)   Wt 270 lb (122.5 kg)   SpO2 95%   BMI 39.87 kg/m   Physical Exam Constitutional:      Appearance: She is obese.  Cardiovascular:     Rate and Rhythm: Normal rate.     Pulses: Normal pulses.   Pulmonary:     Effort: Pulmonary effort is normal.  Abdominal:     General: Bowel sounds are normal.     Palpations: Abdomen is soft. There is mass.     Tenderness: There is abdominal tenderness. There is guarding. There is no right CVA tenderness or left CVA tenderness.  Musculoskeletal:        General: Normal range of motion.  Skin:    General: Skin is warm.     Capillary Refill: Capillary refill takes less than 2 seconds.  Neurological:  Mental Status: She is alert.  Psychiatric:        Mood and Affect: Mood normal.       Assessment & Plan:  Epigastric pain Assessment & Plan: Referral to GI due to abnormal CT,  GI profile stool ordered. Prescribed Protonix 20 mg, Bentyl 20 mg , Advise to take OTC imodium Increase oral fluid intake. Bland diet as tolerated. Follow-up in unable to keep food/fluid down x 24 hours, dizziness, fevers, worsening or persistent symptoms to present to ED or contact primary care provider. Patient verbalizes understanding regarding plan of care and all questions answered.   Orders: -     Ambulatory referral to Gastroenterology -     Pantoprazole Sodium; Take 1 tablet (20 mg total) by mouth daily.  Dispense: 30 tablet; Refill: 2 -     GI Profile, Stool, PCR -     Dicyclomine HCl; Take 1 tablet (20 mg total) by mouth every 6 (six) hours.  Dispense: 30 tablet; Refill: 1  Encounter for eye exam in patient with type 2 diabetes mellitus ([place]) -     Ambulatory referral to Ophthalmology  Other orders -     Loperamide HCl; Take 1 tablet (2 mg total) by mouth 4 (four) times daily as needed for diarrhea or loose stools.  Dispense: 30 tablet; Refill: 0    Return in about 3 months (around 04/10/2023) for chronic follow-up.   Renard Hamper Ria Comment, FNP

## 2023-01-08 NOTE — Assessment & Plan Note (Addendum)
Referral to GI due to abnormal CT,  GI profile stool ordered. Prescribed Protonix 20 mg, Bentyl 20 mg , Advise to take OTC imodium Increase oral fluid intake. Bland diet as tolerated. Follow-up in unable to keep food/fluid down x 24 hours, dizziness, fevers, worsening or persistent symptoms to present to ED or contact primary care provider. Patient verbalizes understanding regarding plan of care and all questions answered.

## 2023-01-11 ENCOUNTER — Other Ambulatory Visit: Payer: Self-pay | Admitting: Family Medicine

## 2023-01-11 MED ORDER — ROSUVASTATIN CALCIUM 10 MG PO TABS: 10.0000 mg | ORAL_TABLET | Freq: Every day | ORAL | 3 refills | Status: DC

## 2023-01-11 NOTE — Telephone Encounter (Signed)
Pharmacy Patient Advocate Encounter  Prior Authorization for Dexcom has been approved by [facility] Medicaid (ins).    PA # [id number] (transmitter) [phone] (sensors)  Effective dates: 12/22/22-12/22/23  There was also a receiver approved on 12/03/22, but it was only good through 01/02/23. If pt needs a receiver, we'll have to redo it.   Looks like the pt was able to fill the sensors and transmitter the day after they were approved.

## 2023-01-12 ENCOUNTER — Encounter: Payer: Self-pay | Admitting: Nurse Practitioner

## 2023-01-13 NOTE — Progress Notes (Signed)
   Care Guide Note  01/13/2023 Name: Jaime [name] MRN: [id number] DOB: 12-30-1985  Referred by: Juanda Chance, FNP Reason for referral : Care Coordination (Outreach to schedule referral with Pharm d )   Jaime [name] is a 37 y.o. year old female who is a primary care patient of Del Eli Hose, FNP. Jaime [name] was referred to the pharmacist for assistance related to DM.    Successful contact was made with the patient to discuss pharmacy services including being ready for the pharmacist to call at least 5 minutes before the scheduled appointment time, to have medication bottles and any blood sugar or blood pressure readings ready for review. The patient agreed to meet with the pharmacist via with the pharmacist via telephone visit on (date/time).  01/20/2023  [name], [place], [place] [address] Direct Dial: [phone] [name]

## 2023-01-14 ENCOUNTER — Inpatient Hospital Stay: Payer: Medicaid Other

## 2023-01-14 ENCOUNTER — Inpatient Hospital Stay: Payer: Medicaid Other | Attending: Hematology | Admitting: Hematology

## 2023-01-14 ENCOUNTER — Encounter (INDEPENDENT_AMBULATORY_CARE_PROVIDER_SITE_OTHER): Payer: Self-pay | Admitting: *Deleted

## 2023-01-14 VITALS — BP 125/77 | HR 104 | Temp 99.2°F | Resp 18 | Ht 70.0 in | Wt 264.4 lb

## 2023-01-14 DIAGNOSIS — R161 Splenomegaly, not elsewhere classified: Secondary | ICD-10-CM | POA: Diagnosis not present

## 2023-01-14 DIAGNOSIS — F17210 Nicotine dependence, cigarettes, uncomplicated: Secondary | ICD-10-CM

## 2023-01-14 DIAGNOSIS — K259 Gastric ulcer, unspecified as acute or chronic, without hemorrhage or perforation: Secondary | ICD-10-CM

## 2023-01-14 DIAGNOSIS — D696 Thrombocytopenia, unspecified: Secondary | ICD-10-CM | POA: Diagnosis not present

## 2023-01-14 LAB — CBC WITH DIFFERENTIAL/PLATELET
Abs Immature Granulocytes: 0.01 10*3/uL (ref 0.00–0.07)
Basophils Absolute: 0 10*3/uL (ref 0.0–0.1)
Basophils Relative: 1 %
Eosinophils Absolute: 0.4 10*3/uL (ref 0.0–0.5)
Eosinophils Relative: 13 %
HCT: 38.3 % (ref 36.0–46.0)
Hemoglobin: 12.5 g/dL (ref 12.0–15.0)
Immature Granulocytes: 0 %
Lymphocytes Relative: 23 %
Lymphs Abs: 0.7 10*3/uL (ref 0.7–4.0)
MCH: 29.8 pg (ref 26.0–34.0)
MCHC: 32.6 g/dL (ref 30.0–36.0)
MCV: 91.2 fL (ref 80.0–100.0)
Monocytes Absolute: 0.3 10*3/uL (ref 0.1–1.0)
Monocytes Relative: 10 %
Neutro Abs: 1.6 10*3/uL — ABNORMAL LOW (ref 1.7–7.7)
Neutrophils Relative %: 53 %
Platelets: 57 10*3/uL — ABNORMAL LOW (ref 150–400)
RBC: 4.2 MIL/uL (ref 3.87–5.11)
RDW: 13.6 % (ref 11.5–15.5)
WBC: 2.9 10*3/uL — ABNORMAL LOW (ref 4.0–10.5)
nRBC: 0 % (ref 0.0–0.2)

## 2023-01-14 LAB — LACTATE DEHYDROGENASE: LDH: 145 U/L (ref 98–192)

## 2023-01-14 LAB — RETICULOCYTES
Immature Retic Fract: 12.8 % (ref 2.3–15.9)
RBC.: 4.17 MIL/uL (ref 3.87–5.11)
Retic Count, Absolute: 65.1 10*3/uL (ref 19.0–186.0)
Retic Ct Pct: 1.6 % (ref 0.4–3.1)

## 2023-01-14 LAB — IRON AND TIBC
Iron: 34 ug/dL (ref 28–170)
Saturation Ratios: 11 % (ref 10.4–31.8)
TIBC: 309 ug/dL (ref 250–450)
UIBC: 275 ug/dL

## 2023-01-14 LAB — IMMATURE PLATELET FRACTION: Immature Platelet Fraction: 7.3 % (ref 1.2–8.6)

## 2023-01-14 LAB — VITAMIN B12: Vitamin B-12: 377 pg/mL (ref 180–914)

## 2023-01-14 LAB — FERRITIN: Ferritin: 66 ng/mL (ref 11–307)

## 2023-01-14 LAB — FOLATE: Folate: 16.8 ng/mL (ref >5.9)

## 2023-01-14 NOTE — Patient Instructions (Signed)
[place] at [facility] Discharge Instructions   You were seen and examined today by Dr. Delton Coombes. He is a blood specialist who is seeing you today on behalf of your primary care provider for low platelets and low white blood cell count.   We will do additional lab work today to investigate this further.   Return as scheduled in 2-3 weeks to review the results of today's labs.    Thank you for choosing [place] at [facility] to provide your oncology and hematology care.  To afford each patient quality time with our provider, please arrive at least 15 minutes before your scheduled appointment time.   If you have a lab appointment with the [place] [facility] please come in thru the Main Entrance and check in at the main information desk.  You need to re-schedule your appointment should you arrive 10 or more minutes late.  We strive to give you quality time with our providers, and arriving late affects you and other patients whose appointments are after yours.  Also, if you no show three or more times for appointments you may be dismissed from the clinic at the providers discretion.     Again, thank you for choosing [facility].  Our hope is that these requests will decrease the amount of time that you wait before being seen by our physicians.       _____________________________________________________________  Should you have questions after your visit to [facility], please contact our office at [phone] and follow the prompts.  Our office hours are 8:00 a.m. and 4:30 p.m. Monday - Friday.  Please note that voicemails left after 4:00 p.m. may not be returned until the following business day.  We are closed weekends and major holidays.  You do have access to a nurse 24-7, just call the main number to the clinic [phone] and do not press any options, hold on the line and a nurse will answer the phone.    For  prescription refill requests, have your pharmacy contact our office and allow 72 hours.    Due to Covid, you will need to wear a mask upon entering the hospital. If you do not have a mask, a mask will be given to you at the Main Entrance upon arrival. For doctor visits, patients may have 1 support person age 62 or older with them. For treatment visits, patients can not have anyone with them due to social distancing guidelines and our immunocompromised population.

## 2023-01-14 NOTE — Progress Notes (Signed)
CONSULT NOTE  Patient Care Team: Del Ria Comment, Lamar Benes, FNP as PCP - General (Family Medicine)  CHIEF COMPLAINTS/PURPOSE OF CONSULTATION:  Moderate to severe thrombocytopenia  HISTORY OF PRESENTING ILLNESS:  Jaime [name] 37 y.o. female is seen in consultation today at the request of Del Ria Comment, FNP for severe thrombocytopenia.  CBC on 01/07/2023 with platelet count 52 K, normal hemoglobin.  White count is also low at 2.9.  She has moderate to severe thrombocytopenia since 08/2014.  She has intermittent leukopenia since 2017.  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  Cirrhotic liver was seen.  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  Last colonoscopy was in May 2023, preparation of colon was poor.  Hemorrhoids found on perianal exam.  She reports no easy bruising for the last 10 years.  Denies any fevers, night sweats or weight loss.  No nosebleeds reported.  She had prior cholecystectomy many years ago and dental extraction on and off year ago without any excessive bleeding.  She denies taking any quinine supplements.  No herbal supplements.  MEDICAL HISTORY:  Past Medical History:  Diagnosis Date   Anxiety    Asthma    Chronic abdominal pain    Chronic back pain    Cirrhosis ([place])    Cirrhosis (HCC)    COPD (chronic obstructive pulmonary disease) ([place])    Depression    Diabetes mellitus without complication ([place])    Diabetes mellitus, type II ([place])    GERD (gastroesophageal reflux disease)    Hepatitis C    Hyperlipidemia    Insomnia    Long-term current use of methadone for opiate dependence ([place])    Lupus ([place])    Migraine headache    Neuropathy    Nocturnal seizures ([place])    Peptic ulcer    Spleen enlarged     SURGICAL HISTORY: Past Surgical History:  Procedure Laterality Date   BIOPSY  11/25/2021   Procedure: BIOPSY;  Surgeon: Harvel Quale, MD;  [facility];  Service: Gastroenterology;;   CHOLECYSTECTOMY     COLONOSCOPY WITH PROPOFOL N/A 11/25/2021   Procedure: COLONOSCOPY WITH PROPOFOL;  Surgeon: Harvel Quale, MD;  [facility];  Service: Gastroenterology;  Laterality: N/A;  805   COLONOSCOPY WITH PROPOFOL N/A 03/31/2022   Procedure: COLONOSCOPY WITH PROPOFOL;  Surgeon: Harvel Quale, MD;  [facility];  Service: Gastroenterology;  Laterality: N/A;  730   ESOPHAGOGASTRODUODENOSCOPY  06/2020   done at [facility], candida in upper esophagus (treated with diflucan), ulcerative esophagitis at GE junction, gastritis in stomach, single ulcer in duodenal bulb, with duodenal mucosa showing no abnormality. No presence of varices   ESOPHAGOGASTRODUODENOSCOPY (EGD) WITH PROPOFOL N/A 11/25/2021   Procedure: ESOPHAGOGASTRODUODENOSCOPY (EGD) WITH PROPOFOL;  Surgeon: Harvel Quale, MD;  [facility];  Service: Gastroenterology;  Laterality: N/A;   ESOPHAGOGASTRODUODENOSCOPY (EGD) WITH PROPOFOL N/A 03/31/2022   Procedure: ESOPHAGOGASTRODUODENOSCOPY (EGD) WITH PROPOFOL;  Surgeon: Harvel Quale, MD;  [facility];  Service: Gastroenterology;  Laterality: N/A;    SOCIAL HISTORY: Social History   Socioeconomic History   Marital status: Single    Spouse name: Not on file   Number of children: Not on file   Years of education: Not on file   Highest education level: Not on file  Occupational History   Not on file  Tobacco Use   Smoking status: Every  Day    Types: E-cigarettes   Smokeless tobacco: Never  Vaping Use   Vaping Use: Every day   Substances: Nicotine, Flavoring  Substance and Sexual Activity   Alcohol use: No   Drug use: No   Sexual activity: Not Currently    Birth control/protection: Implant  Other Topics Concern   Not on file  Social History Narrative   Not on file   Social Determinants of Health   Financial Resource Strain: Medium Risk  (05/09/2020)   Overall Financial Resource Strain (CARDIA)    Difficulty of Paying Living Expenses: Somewhat hard  Food Insecurity: Food Insecurity Present (01/14/2023)   Hunger Vital Sign    Worried About Running Out of Food in the Last Year: Sometimes true    Ran Out of Food in the Last Year: Never true  Transportation Needs: No Transportation Needs (01/14/2023)   PRAPARE - Hydrologist (Medical): No    Lack of Transportation (Non-Medical): No  Physical Activity: Insufficiently Active (05/09/2020)   Exercise Vital Sign    Days of Exercise per Week: 1 day    Minutes of Exercise per Session: 20 min  Stress: Stress Concern Present (05/09/2020)   [place]    Feeling of Stress : Very much  Social Connections: Moderately Isolated (05/09/2020)   Social Connection and Isolation Panel [NHANES]    Frequency of Communication with Friends and Family: More than three times a week    Frequency of Social Gatherings with Friends and Family: Twice a week    Attends Religious Services: More than 4 times per year    Active Member of [employer] or Organizations: No    Attends Archivist Meetings: Never    Marital Status: Never married  Intimate Partner Violence: Not At Risk (01/14/2023)   Humiliation, Afraid, Rape, and Kick questionnaire    Fear of Current or Ex-Partner: No    Emotionally Abused: No    Physically Abused: No    Sexually Abused: No    FAMILY HISTORY: Family History  Problem Relation Age of Onset   Hyperlipidemia Maternal Grandfather     ALLERGIES:  is allergic to iodine-131, ivp dye [iodinated contrast media], ketorolac tromethamine, tylenol [acetaminophen], vancomycin, gabapentin, nsaids, and suboxone [buprenorphine hcl-naloxone hcl].  MEDICATIONS:  Current Outpatient Medications  Medication Sig Dispense Refill   Accu-Chek Softclix Lancets lancets USE TO TEST BLOOD SUGAR 4 TIMES A DAY.  150 each 3   albuterol (VENTOLIN HFA) 108 (90 Base) MCG/ACT inhaler Inhale 2 puffs into the lungs every 6 (six) hours as needed for wheezing or shortness of breath. 8 g 0   ARIPiprazole (ABILIFY) 20 MG tablet Take 20 mg by mouth daily.     aspirin EC 81 MG tablet Take 81 mg by mouth daily. Swallow whole.     cephALEXin (KEFLEX) 500 MG capsule Take 1 capsule (500 mg total) by mouth 4 (four) times daily. 28 capsule 0   cetirizine (ZYRTEC ALLERGY) 10 MG tablet Take 1 tablet (10 mg total) by mouth daily. 30 tablet 0   clonazePAM (KLONOPIN) 0.5 MG tablet Take 0.5 mg by mouth 2 (two) times daily.     Continuous Blood Gluc Sensor (DEXCOM G6 SENSOR) MISC Change sensor every 10 days. 6 each 3   Continuous Blood Gluc Transmit (DEXCOM G6 TRANSMITTER) MISC USE AS DIRECTED. 1 each 1   cyclobenzaprine (FLEXERIL) 5 MG tablet Take 1 tablet (5 mg total) by mouth 3 (  three) times daily as needed for muscle spasms. 30 tablet 0   dicyclomine (BENTYL) 20 MG tablet Take 1 tablet (20 mg total) by mouth every 6 (six) hours. 30 tablet 1   etonogestrel (NEXPLANON) 68 MG IMPL implant 1 each by Subdermal route once.     famotidine (PEPCID) 20 MG tablet Take 1 tablet (20 mg total) by mouth 2 (two) times daily. 30 tablet 0   fluticasone (FLONASE) 50 MCG/ACT nasal spray Place 2 sprays into both nostrils daily. 16 g 0   furosemide (LASIX) 40 MG tablet Take 40 mg by mouth daily.     Glecaprevir-Pibrentasvir (MAVYRET) 100-40 MG TABS Take 3 tablets by mouth daily.     glipiZIDE (GLUCOTROL XL) 5 MG 24 hr tablet Take 1 tablet (5 mg total) by mouth daily with breakfast. 90 tablet 1   glucose blood (ACCU-CHEK GUIDE) test strip Use as instructed to check blood glucose four times daily 150 each 5   Insulin Pen Needle (GLOBAL EASE INJECT PEN NEEDLES) 31G X 8 MM MISC Use 1 pen needle 4 times daily for insulin. 400 each 0   Insulin Pen Needle (PEN NEEDLES) 32G X 6 MM MISC 1 each by Does not apply route 3 (three) times daily. 100 each 3    insulin regular human CONCENTRATED (HUMULIN R U-500 KWIKPEN) 500 UNIT/ML KwikPen Inject 110 Units into the skin 3 (three) times daily with meals. 60 mL 3   LINZESS 290 MCG CAPS capsule Take 290 mcg by mouth daily.     liraglutide (VICTOZA) 18 MG/3ML SOPN Inject 1.8 mg into the skin daily.     lisinopril (ZESTRIL) 5 MG tablet TAKE 1 TABLET ONCE DAILY. 30 tablet 0   loperamide (IMODIUM A-D) 2 MG tablet Take 1 tablet (2 mg total) by mouth 4 (four) times daily as needed for diarrhea or loose stools. 30 tablet 0   lubiprostone (AMITIZA) 24 MCG capsule Take 1 capsule (24 mcg total) by mouth 2 (two) times daily with a meal. 180 capsule 3   menthol-cetylpyridinium (CEPACOL) 3 MG lozenge Take 1 lozenge (3 mg total) by mouth as needed for sore throat. 100 tablet 0   metFORMIN (GLUCOPHAGE) 1000 MG tablet Take 1 tablet (1,000 mg total) by mouth 2 (two) times daily with a meal. 180 tablet 3   methadone (DOLOPHINE) 10 MG/5ML solution Take 3 mLs by mouth every morning.     methocarbamol (ROBAXIN) 500 MG tablet Take 500 mg by mouth at bedtime.     Multiple Vitamin (MULTIVITAMIN) tablet Take 1 tablet by mouth daily.     naloxegol oxalate (MOVANTIK) 25 MG TABS tablet Take 1 tablet (25 mg total) by mouth daily. 90 tablet 3   nitrofurantoin, macrocrystal-monohydrate, (MACROBID) 100 MG capsule Take 1 capsule (100 mg total) by mouth 2 (two) times daily. 10 capsule 0   nystatin (MYCOSTATIN/NYSTOP) powder Apply 1 application topically 3 (three) times daily. 15 g 0   ondansetron (ZOFRAN-ODT) 4 MG disintegrating tablet Take 1 tablet (4 mg total) by mouth every 8 (eight) hours as needed for nausea or vomiting. 12 tablet 0   oxyCODONE (ROXICODONE) 5 MG immediate release tablet Take 1 tablet (5 mg total) by mouth every 4 (four) hours as needed for severe pain. 12 tablet 0   pantoprazole (PROTONIX) 20 MG tablet Take 1 tablet (20 mg total) by mouth daily. 30 tablet 2   polyethylene glycol (MIRALAX / GLYCOLAX) 17 g packet Take 17  g by mouth daily as needed for mild constipation.  polyethylene glycol-electrolytes (TRILYTE) 420 g solution Take 4,000 mLs by mouth as directed. 4000 mL 0   potassium chloride (KLOR-CON M) 10 MEQ tablet Take 10 mEq by mouth 2 (two) times daily.     pregabalin (LYRICA) 75 MG capsule Take 75 mg by mouth in the morning, at noon, in the evening, and at bedtime.      promethazine (PHENERGAN) 12.5 MG tablet Take 1 tablet (12.5 mg total) by mouth every 8 (eight) hours as needed for nausea or vomiting. 30 tablet 2   propranolol (INDERAL) 10 MG tablet Take 10 mg by mouth 3 (three) times daily.     RESTASIS 0.05 % ophthalmic emulsion Place 1 drop into both eyes as needed (dry eye).      rosuvastatin (CRESTOR) 10 MG tablet Take 1 tablet (10 mg total) by mouth daily. 90 tablet 3   spironolactone (ALDACTONE) 100 MG tablet Take 100 mg by mouth daily.      sucralfate (CARAFATE) 1 GM/10ML suspension Take 10 mLs (1 g total) by mouth 4 (four) times daily -  with meals and at bedtime. 420 mL 1   Suvorexant (BELSOMRA) 20 MG TABS Take 20 mg by mouth at bedtime.     tirzepatide (MOUNJARO) 7.5 MG/0.5ML Pen Inject 7.5 mg into the skin once a week. 6 mL 3   triamcinolone (KENALOG) 0.1 % Apply 1 application topically daily as needed (irritation).     venlafaxine XR (EFFEXOR-XR) 150 MG 24 hr capsule Take total of 225 mg daily (150 mg + 75 mg ) 90 capsule 0   venlafaxine XR (EFFEXOR-XR) 75 MG 24 hr capsule To take along with '150mg'$  for a total of 225 mg daily 90 capsule 0   Vitamin D, Ergocalciferol, (DRISDOL) 1.25 MG (50000 UNIT) CAPS capsule Take 1 capsule (50,000 Units total) by mouth every 7 (seven) days. (Patient taking differently: Take 50,000 Units by mouth every Tuesday.) 12 capsule 0   vortioxetine HBr (TRINTELLIX) 20 MG TABS tablet Take 20 mg by mouth daily.     No current facility-administered medications for this visit.    REVIEW OF SYSTEMS:   Constitutional: Denies fevers, chills or abnormal night  sweats Eyes: Denies blurriness of vision, double vision or watery eyes Ears, nose, mouth, throat, and face: Denies mucositis or sore throat Respiratory: Denies cough, dyspnea or wheezes Cardiovascular: Denies palpitation, chest discomfort or lower extremity swelling Gastrointestinal:  Denies nausea, heartburn or change in bowel habits Skin: Denies abnormal skin rashes Lymphatics: Denies new lymphadenopathy or easy bruising Neurological:Denies numbness, tingling or new weaknesses.  Positive for anxiety. Behavioral/Psych: Mood is stable, no new changes  All other systems were reviewed with the patient and are negative.  PHYSICAL EXAMINATION: ECOG PERFORMANCE STATUS: 1 - Symptomatic but completely ambulatory  Vitals:   01/14/23 0838  BP: 125/77  Pulse: (!) 104  Resp: 18  Temp: 99.2 F (37.3 C)  SpO2: 98%   Filed Weights   01/14/23 0838  Weight: 264 lb 6.4 oz (119.9 kg)    GENERAL:alert, no distress and comfortable SKIN: skin color, texture, turgor are normal, no rashes or significant lesions EYES: normal, conjunctiva are pink and non-injected, sclera clear OROPHARYNX:no exudate, no erythema and lips, buccal mucosa, and tongue normal  NECK: supple, thyroid normal size, non-tender, without nodularity LYMPH:  no palpable lymphadenopathy in the cervical, axillary or inguinal LUNGS: clear to auscultation and percussion with normal breathing effort HEART: regular rate & rhythm and no murmurs and no lower extremity edema ABDOMEN:abdomen soft, non-tender and  normal bowel sounds Musculoskeletal:no cyanosis of digits and no clubbing  PSYCH: alert & oriented x 3 with fluent speech NEURO: no focal motor/sensory deficits  LABORATORY DATA:  I have reviewed the data as listed Recent Results (from the past 2160 hour(s))  CBC with Differential/Platelet     Status: Abnormal   Collection Time: 01/01/23  2:08 PM  Result Value Ref Range   WBC 2.8 (L) 3.4 - 10.8 x10E3/uL   RBC 4.58 3.77 -  5.28 x10E6/uL   Hemoglobin 13.5 11.1 - 15.9 g/dL   Hematocrit 41.9 34.0 - 46.6 %   MCV 92 79 - 97 fL   MCH 29.5 26.6 - 33.0 pg   MCHC 32.2 31.5 - 35.7 g/dL   RDW 13.1 11.7 - 15.4 %   Platelets 55 (LL) 150 - 450 x10E3/uL    Comment: Actual platelet count may be somewhat higher than reported due to aggregation of platelets in this sample.    Neutrophils 62 Not Estab. %   Lymphs 23 Not Estab. %   Monocytes 10 Not Estab. %   Eos 5 Not Estab. %   Basos 0 Not Estab. %   Neutrophils Absolute 1.7 1.4 - 7.0 x10E3/uL   Lymphocytes Absolute 0.7 0.7 - 3.1 x10E3/uL   Monocytes Absolute 0.3 0.1 - 0.9 x10E3/uL   EOS (ABSOLUTE) 0.1 0.0 - 0.4 x10E3/uL   Basophils Absolute 0.0 0.0 - 0.2 x10E3/uL   Immature Granulocytes 0 Not Estab. %   Immature Grans (Abs) 0.0 0.0 - 0.1 x10E3/uL   Hematology Comments: Note:     Comment: Verified by microscopic examination.  [id number]+EGFR     Status: Abnormal   Collection Time: 01/01/23  2:08 PM  Result Value Ref Range   Glucose 274 (H) 70 - 99 mg/dL   BUN 11 6 - 20 mg/dL   Creatinine, Ser 0.65 0.57 - 1.00 mg/dL   eGFR 115 >59 mL/min/1.73   BUN/Creatinine Ratio 17 9 - 23   Sodium 143 134 - 144 mmol/L   Potassium 3.8 3.5 - 5.2 mmol/L   Chloride 104 96 - 106 mmol/L   CO2 21 20 - 29 mmol/L   Calcium 8.5 (L) 8.7 - 10.2 mg/dL   Total Protein 5.8 (L) 6.0 - 8.5 g/dL   Albumin 3.3 (L) 3.9 - 4.9 g/dL   Globulin, Total 2.5 1.5 - 4.5 g/dL   Albumin/Globulin Ratio 1.3 1.2 - 2.2   Bilirubin Total 1.3 (H) 0.0 - 1.2 mg/dL   Alkaline Phosphatase 144 (H) 44 - 121 IU/L   AST 35 0 - 40 IU/L   ALT 28 0 - 32 IU/L  Hemoglobin A1c     Status: Abnormal   Collection Time: 01/01/23  2:08 PM  Result Value Ref Range   Hgb A1c MFr Bld 9.3 (H) 4.8 - 5.6 %    Comment:          Prediabetes: 5.7 - 6.4          Diabetes: >6.4          Glycemic control for adults with diabetes: <7.0    Est. average glucose Bld gHb Est-mCnc 220 mg/dL  Lipid panel     Status: Abnormal   Collection  Time: 01/01/23  2:08 PM  Result Value Ref Range   Cholesterol, Total 134 100 - 199 mg/dL   Triglycerides 264 (H) 0 - 149 mg/dL   HDL 35 (L) >39 mg/dL   VLDL Cholesterol Cal 42 (H) 5 - 40 mg/dL   LDL  Chol Calc (NIH) 57 0 - 99 mg/dL   Chol/HDL Ratio 3.8 0.0 - 4.4 ratio    Comment:                                   T. Chol/HDL Ratio                                             Men  Women                               1/2 Avg.Risk  3.4    3.3                                   Avg.Risk  5.0    4.4                                2X Avg.Risk  9.6    7.1                                3X Avg.Risk 23.4   11.0   Microalbumin / creatinine urine ratio     Status: None   Collection Time: 01/01/23  2:08 PM  Result Value Ref Range   Creatinine, Urine 201.6 Not Estab. mg/dL   Microalbumin, Urine 16.8 Not Estab. ug/mL   Microalb/Creat Ratio 8 0 - 29 mg/g creat    Comment:                        Normal:                0 -  29                        Moderately increased: 30 - 300                        Severely increased:       >300   TSH + free T4     Status: None   Collection Time: 01/01/23  2:08 PM  Result Value Ref Range   TSH 2.280 0.450 - 4.500 uIU/mL   Free T4 0.97 0.82 - 1.77 ng/dL  POC CBG, ED     Status: Abnormal   Collection Time: 01/04/23  8:05 PM  Result Value Ref Range   Glucose-Capillary 267 (H) 70 - 99 mg/dL    Comment: Glucose reference range applies only to samples taken after fasting for at least 8 hours.  Lipase, blood     Status: None   Collection Time: 01/04/23  8:33 PM  Result Value Ref Range   Lipase 31 11 - 51 U/L    Comment: Performed at [facility], [address], [place], [place] [address]  Comprehensive metabolic panel     Status: Abnormal   Collection Time: 01/04/23  8:33 PM  Result Value Ref Range   Sodium 136 135 - 145 mmol/L   Potassium 4.2 3.5 - 5.1 mmol/L   Chloride 110  98 - 111 mmol/L   CO2 20 (L) 22 - 32 mmol/L   Glucose, Bld 263 (H) 70 - 99 mg/dL     Comment: Glucose reference range applies only to samples taken after fasting for at least 8 hours.   BUN 7 6 - 20 mg/dL   Creatinine, Ser 0.59 0.44 - 1.00 mg/dL   Calcium 7.7 (L) 8.9 - 10.3 mg/dL   Total Protein 6.4 (L) 6.5 - 8.1 g/dL   Albumin 3.0 (L) 3.5 - 5.0 g/dL   AST 45 (H) 15 - 41 U/L   ALT 30 0 - 44 U/L   Alkaline Phosphatase 121 38 - 126 U/L   Total Bilirubin 1.2 0.3 - 1.2 mg/dL   GFR, Estimated >60 >60 mL/min    Comment: (NOTE) Calculated using the CKD-EPI Creatinine Equation (2021)    Anion gap 6 5 - 15    Comment: Performed at [facility], [address], [place], [place] [address]  CBC     Status: Abnormal   Collection Time: 01/04/23  8:33 PM  Result Value Ref Range   WBC 5.1 4.0 - 10.5 K/uL   RBC 4.58 3.87 - 5.11 MIL/uL   Hemoglobin 13.6 12.0 - 15.0 g/dL   HCT 42.4 36.0 - 46.0 %   MCV 92.6 80.0 - 100.0 fL   MCH 29.7 26.0 - 34.0 pg   MCHC 32.1 30.0 - 36.0 g/dL   RDW 14.1 11.5 - 15.5 %   Platelets 61 (L) 150 - 400 K/uL    Comment: SPECIMEN CHECKED FOR CLOTS Immature Platelet Fraction may be clinically indicated, consider ordering this additional test [id number] PLATELET COUNT CONFIRMED BY SMEAR    nRBC 0.0 0.0 - 0.2 %    Comment: Performed at [facility], [address] [place], [place] [address]  POC urine preg, ED     Status: None   Collection Time: 01/04/23  9:31 PM  Result Value Ref Range   Preg Test, Ur Negative Negative  Urinalysis, Routine w reflex microscopic -Urine, Clean Catch     Status: Abnormal   Collection Time: 01/04/23  9:41 PM  Result Value Ref Range   Color, Urine YELLOW YELLOW   APPearance HAZY (A) CLEAR   Specific Gravity, Urine 1.017 1.005 - 1.030   pH 5.0 5.0 - 8.0   Glucose, UA >=500 (A) NEGATIVE mg/dL   Hgb urine dipstick NEGATIVE NEGATIVE   Bilirubin Urine NEGATIVE NEGATIVE   Ketones, ur NEGATIVE NEGATIVE mg/dL   Protein, ur NEGATIVE NEGATIVE mg/dL   Nitrite POSITIVE (A) NEGATIVE   Leukocytes,Ua NEGATIVE NEGATIVE   RBC / HPF  0-5 0 - 5 RBC/hpf   WBC, UA 21-50 0 - 5 WBC/hpf   Bacteria, UA MANY (A) NONE SEEN   Squamous Epithelial / HPF 11-20 0 - 5 /HPF   Mucus PRESENT     Comment: Performed at [facility], [address] [place], [place] [address]  Lipase, blood     Status: None   Collection Time: 01/07/23  6:30 AM  Result Value Ref Range   Lipase 31 11 - 51 U/L    Comment: Performed at [facility] [address] [place], [place] [address]  Comprehensive metabolic panel     Status: Abnormal   Collection Time: 01/07/23  6:30 AM  Result Value Ref Range   Sodium 139 135 - 145 mmol/L   Potassium 3.8 3.5 - 5.1 mmol/L   Chloride 109 98 - 111 mmol/L   CO2 23 22 -  32 mmol/L   Glucose, Bld 86 70 - 99 mg/dL    Comment: Glucose reference range applies only to samples taken after fasting for at least 8 hours.   BUN 7 6 - 20 mg/dL   Creatinine, Ser 0.53 0.44 - 1.00 mg/dL   Calcium 8.1 (L) 8.9 - 10.3 mg/dL   Total Protein 5.8 (L) 6.5 - 8.1 g/dL   Albumin 2.6 (L) 3.5 - 5.0 g/dL   AST 39 15 - 41 U/L   ALT 26 0 - 44 U/L   Alkaline Phosphatase 113 38 - 126 U/L   Total Bilirubin 0.8 0.3 - 1.2 mg/dL   GFR, Estimated >60 >60 mL/min    Comment: (NOTE) Calculated using the CKD-EPI Creatinine Equation (2021)    Anion gap 7 5 - 15    Comment: Performed at [facility], [address] [place], [place] [address]  CBC     Status: Abnormal   Collection Time: 01/07/23  6:30 AM  Result Value Ref Range   WBC 2.9 (L) 4.0 - 10.5 K/uL   RBC 4.06 3.87 - 5.11 MIL/uL   Hemoglobin 12.4 12.0 - 15.0 g/dL   HCT 38.3 36.0 - 46.0 %   MCV 94.3 80.0 - 100.0 fL   MCH 30.5 26.0 - 34.0 pg   MCHC 32.4 30.0 - 36.0 g/dL   RDW 13.5 11.5 - 15.5 %   Platelets 52 (L) 150 - 400 K/uL    Comment: SPECIMEN CHECKED FOR CLOTS Immature Platelet Fraction may be clinically indicated, consider ordering this additional test [id number] PLATELET COUNT CONFIRMED BY SMEAR    nRBC 0.0 0.0 - 0.2 %    Comment: Performed at [facility], [address]  [place], [place] [address]  Urinalysis, Routine w reflex microscopic -Urine, Clean Catch     Status: Abnormal   Collection Time: 01/07/23  8:15 AM  Result Value Ref Range   Color, Urine YELLOW YELLOW   APPearance HAZY (A) CLEAR   Specific Gravity, Urine 1.010 1.005 - 1.030   pH 7.0 5.0 - 8.0   Glucose, UA NEGATIVE NEGATIVE mg/dL   Hgb urine dipstick TRACE (A) NEGATIVE   Bilirubin Urine NEGATIVE NEGATIVE   Ketones, ur NEGATIVE NEGATIVE mg/dL   Protein, ur NEGATIVE NEGATIVE mg/dL   Nitrite POSITIVE (A) NEGATIVE   Leukocytes,Ua TRACE (A) NEGATIVE    Comment: Performed at [facility], [address], [place], [place] [address]  Pregnancy, urine     Status: None   Collection Time: 01/07/23  8:15 AM  Result Value Ref Range   Preg Test, Ur NEGATIVE NEGATIVE    Comment:        THE SENSITIVITY OF THIS METHODOLOGY IS >20 mIU/mL. Performed at [facility], [address], [place], [place] [address]   Urinalysis, Microscopic (reflex)     Status: Abnormal   Collection Time: 01/07/23  8:15 AM  Result Value Ref Range   RBC / HPF 0-5 0 - 5 RBC/hpf   WBC, UA 11-20 0 - 5 WBC/hpf   Bacteria, UA MANY (A) NONE SEEN   Squamous Epithelial / HPF 21-50 0 - 5 /HPF    Comment: Performed at [facility], [address], [place], [place] [address]  Lactate dehydrogenase     Status: None   Collection Time: 01/14/23  9:03 AM  Result Value Ref Range   LDH 145 98 - 192 U/L    Comment: Performed at [facility], [address], [place],  [address]  Reticulocytes  Status: None   Collection Time: 01/14/23  9:03 AM  Result Value Ref Range   Retic Ct Pct 1.6 0.4 - 3.1 %   RBC. 4.17 3.87 - 5.11 MIL/uL   Retic Count, Absolute 65.1 19.0 - 186.0 K/uL   Immature Retic Fract 12.8 2.3 - 15.9 %    Comment: Performed at [facility], [address] [place], [place] [address]  Immature Platelet Fraction     Status: None   Collection Time: 01/14/23  9:03 AM  Result Value Ref Range   Immature Platelet Fraction 7.3  1.2 - 8.6 %    Comment: Performed at [facility], [address] [place], [place] [address]  CBC with Differential     Status: Abnormal   Collection Time: 01/14/23  9:03 AM  Result Value Ref Range   WBC 2.9 (L) 4.0 - 10.5 K/uL   RBC 4.20 3.87 - 5.11 MIL/uL   Hemoglobin 12.5 12.0 - 15.0 g/dL   HCT 38.3 36.0 - 46.0 %   MCV 91.2 80.0 - 100.0 fL   MCH 29.8 26.0 - 34.0 pg   MCHC 32.6 30.0 - 36.0 g/dL   RDW 13.6 11.5 - 15.5 %   Platelets 57 (L) 150 - 400 K/uL    Comment: SPECIMEN CHECKED FOR CLOTS CONSISTENT WITH PREVIOUS RESULT PLATELET COUNT CONFIRMED BY SMEAR    nRBC 0.0 0.0 - 0.2 %   Neutrophils Relative % 53 %   Neutro Abs 1.6 (L) 1.7 - 7.7 K/uL   Lymphocytes Relative 23 %   Lymphs Abs 0.7 0.7 - 4.0 K/uL   Monocytes Relative 10 %   Monocytes Absolute 0.3 0.1 - 1.0 K/uL   Eosinophils Relative 13 %   Eosinophils Absolute 0.4 0.0 - 0.5 K/uL   Basophils Relative 1 %   Basophils Absolute 0.0 0.0 - 0.1 K/uL   WBC Morphology MORPHOLOGY UNREMARKABLE    RBC Morphology MORPHOLOGY UNREMARKABLE    Smear Review PLATELET COUNT CONFIRMED BY SMEAR    Immature Granulocytes 0 %   Abs Immature Granulocytes 0.01 0.00 - 0.07 K/uL    Comment: Performed at [facility], [address], [place], [place] [address]  Folate     Status: None   Collection Time: 01/14/23  9:04 AM  Result Value Ref Range   Folate 16.8 >5.9 ng/mL    Comment: Performed at [facility], [address], [place], [place] [address]  Vitamin B12     Status: None   Collection Time: 01/14/23  9:04 AM  Result Value Ref Range   Vitamin B-12 377 180 - 914 pg/mL    Comment: (NOTE) This assay is not validated for testing neonatal or myeloproliferative syndrome specimens for Vitamin B12 levels. Performed at [facility], [address] [place], [place] [address]   Ferritin     Status: None   Collection Time: 01/14/23  9:04 AM  Result Value Ref Range   Ferritin 66 11 - 307 ng/mL    Comment: Performed at [facility], [address], [place], [place] [address]  Iron and TIBC [facility]/[place] ONLY)     Status: None   Collection Time: 01/14/23  9:04 AM  Result Value Ref Range   Iron 34 28 - 170 ug/dL   TIBC 309 250 - 450 ug/dL   Saturation Ratios 11 10.4 - 31.8 %   UIBC 275 ug/dL    Comment: Performed at [facility], [address], [place], [place] [address]    RADIOGRAPHIC STUDIES: I  have personally reviewed the radiological images as listed and agreed with the findings in the report. CT ABDOMEN PELVIS WO CONTRAST  Result Date: 01/07/2023 CLINICAL DATA:  Epigastric pain. EXAM: CT ABDOMEN AND PELVIS WITHOUT CONTRAST TECHNIQUE: Multidetector CT imaging of the abdomen and pelvis was performed following the standard protocol without IV contrast. RADIATION DOSE REDUCTION: This exam was performed according to the departmental dose-optimization program which includes automated exposure control, adjustment of the mA and/or kV according to patient size and/or use of iterative reconstruction technique. COMPARISON:  CT without contrast 08/09/2022 and 11/08/2021 FINDINGS: Lower chest: There is increased distal esophageal thickening. Endoscopic follow-up suggested. There is increased subsegmental atelectasis in the lung bases without infiltrates. Hepatobiliary: The liver is mildly prominent, with again noted cirrhotic changes. The liver is less steatotic than previously. No mass is seen without contrast. Gallbladder is absent with no biliary dilatation. Pancreas: Diffuse fatty replacement. No other focal abnormality. There is increased edema around the pancreas but there is increased edema elsewhere in the abdomen and pelvis as well. Correlate clinically for underlying pancreatitis. This could be part of the generalized mesenteric congestive changes or due to interval new pancreatitis. Spleen: Splenomegaly with splenic length 26.1 cm and splenorenal varices. No mass is seen without contrast. Adrenals/Urinary Tract: No focal  abnormality of the unenhanced adrenal glands and kidneys. No urinary stones or obstruction. The bladder is unremarkable. Stomach/Bowel: There is diffuse thickening of the stomach which could be from gastritis or portal gastropathy. The unopacified small bowel is normal caliber. There is no large bowel thickening. There is diverticulosis without evidence of diverticulitis. The appendix is normal. Vascular/Lymphatic: Mild aortic atherosclerosis. No AAA. The hepatic portal vein is prominent measuring 17 mm with splenic vein engorgement, splenorenal varices and a recanalized umbilical vein consistent with portal hypertension. This was seen previously. No adenopathy is seen. Reproductive: Uterus and bilateral adnexa are unremarkable. Other: There is increased edema in the abdominal wall pannus subcutaneously which could be congestive or due to cellulitis. Elsewhere there is somewhat increased body wall anasarca especially in the flanks. There are increased mesenteric congestive features and there is increased mild-to-moderate abdominopelvic free ascites. The largest pockets are in the anterior right and left lower abdomen below the level of the cecum and descending colon. There is no free air or abscess. There are small umbilical and inguinal fat hernias but no incarcerated hernias. Musculoskeletal: There is osteopenia with interval new demonstration of a mild upper plate anterior wedge compression fracture of the L5 vertebral body. There is no retropulsion. Anterior height loss is about 10% with no posterior height loss. Age is indeterminate but this was not present on 08/09/2022. There are degenerative changes in the thoracic and lumbar spine with mild chronic wedge compression fractures T8, T10, T11 and T12. IMPRESSION: 1. Increased distal esophageal thickening. Endoscopic follow-up suggested. 2. Increased mesenteric edema which could be due to generalized mesenteric congestive changes. 3. Some of the edema is around  the pancreas. Correlate clinically for underlying pancreatitis. 4. Cirrhotic liver with splenomegaly, splenorenal varices, prominence of the portal main vein and recanalized umbilical vein. 5. Increased mild-to-moderate free ascites. 6. Increased edema in the abdominal wall pannus subcutaneously which could be congestive or due to cellulitis. 7. Increased thickening of the stomach which could be due to gastritis or portal gastropathy. 8. Osteopenia with interval new mild upper plate anterior wedge compression fracture of the L5 vertebral body. Age is indeterminate but this was not present on 08/09/2022. 9. Diverticulosis without evidence of diverticulitis.  10. Aortic atherosclerosis. Aortic Atherosclerosis (ICD10-I70.0). Electronically Signed   By: Telford Nab M.D.   On: 01/07/2023 07:54   DG Chest Port 1 View  Result Date: 01/04/2023 CLINICAL DATA:  Chest pain EXAM: PORTABLE CHEST 1 VIEW COMPARISON:  07/19/2022 FINDINGS: Lungs are clear.  No pleural effusion or pneumothorax. The heart is normal in size. IMPRESSION: No evidence of acute cardiopulmonary disease. Electronically Signed   By: Julian Hy M.D.   On: 01/04/2023 23:15    ASSESSMENT:  1.  Moderate to severe thrombocytopenia: - Platelet count between 45 K-145 K since 08/2014 - Reports easy bruising for the last 10 years but no active bleeding other than previous history of rectal bleeding.  Had dental extractions 1 and half year ago without any excessive bleeding. - CTAP (01/07/2023): Splenomegaly with length measuring 26.1 cm and splenorenal varices.  Cirrhotic changes in the liver. - Colonoscopy (03/31/2022): Preparation of the colon was poor.  Hemorrhoids found on perianal exam. 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  No gastric varices.  5 nonbleeding cratered gastric ulcers with a clear ulcer base in the gastric antrum.  2.  Social/family history: - She is currently on disability.   Previously she worked in Therapist, art job at a call center.  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  No history of alcohol use.  Current active smoker half pack to 1 pack of cigarettes per day for 5 years. - No family history of low platelets.  Father had melanoma.  No family history of leukemia.   PLAN:  1.  Moderate to severe thrombocytopenia: - We talked about the differential diagnosis of low platelet count including splenic sequestration, liver disease leading to decreased production of platelets, nutritional deficiencies, immune mediated thrombocytopenia from destruction. - We will check for nutritional deficiencies, connective tissue disorders and bone marrow infiltrative process. - She will come back in 2 weeks for follow-up. - If she is on liver transplant list, will consider bone marrow biopsy to rule out any underlying process. - If her platelet count continues to get worse, will consider splenic embolization for improving cytopenias from hypersplenism. - If she needs preprocedure increase of platelet count, will use avatrombopag.   All questions were answered. The patient knows to call the clinic with any problems, questions or concerns.      Derek Jack, MD 01/14/23 5:15 PM

## 2023-01-15 ENCOUNTER — Emergency Department (HOSPITAL_COMMUNITY)
Admission: EM | Admit: 2023-01-15 | Discharge: 2023-01-15 | Discharge status: Against Medical Advice | Payer: Medicaid Other | Attending: Emergency Medicine | Admitting: Emergency Medicine

## 2023-01-15 ENCOUNTER — Encounter (HOSPITAL_COMMUNITY): Payer: Self-pay | Admitting: Emergency Medicine

## 2023-01-15 ENCOUNTER — Emergency Department (HOSPITAL_COMMUNITY)
Admission: EM | Admit: 2023-01-15 | Discharge: 2023-01-15 | Discharge status: Against Medical Advice | Payer: Medicaid Other | Source: Home / Self Care

## 2023-01-15 ENCOUNTER — Other Ambulatory Visit: Payer: Self-pay

## 2023-01-15 DIAGNOSIS — M7989 Other specified soft tissue disorders: Secondary | ICD-10-CM | POA: Insufficient documentation

## 2023-01-15 DIAGNOSIS — R109 Unspecified abdominal pain: Secondary | ICD-10-CM | POA: Diagnosis present

## 2023-01-15 DIAGNOSIS — R110 Nausea: Secondary | ICD-10-CM | POA: Diagnosis not present

## 2023-01-15 DIAGNOSIS — Z5321 Procedure and treatment not carried out due to patient leaving prior to being seen by health care provider: Secondary | ICD-10-CM | POA: Insufficient documentation

## 2023-01-15 LAB — KAPPA/LAMBDA LIGHT CHAINS
Kappa free light chain: 43.6 mg/L — ABNORMAL HIGH (ref 3.3–19.4)
Kappa, lambda light chain ratio: 1.28 (ref 0.26–1.65)
Lambda free light chains: 34.1 mg/L — ABNORMAL HIGH (ref 5.7–26.3)

## 2023-01-15 LAB — LUPUS ANTICOAGULANT PANEL
DRVVT: 35.2 s (ref 0.0–47.0)
PTT Lupus Anticoagulant: 40.7 s (ref 0.0–43.5)

## 2023-01-15 NOTE — ED Triage Notes (Signed)
Pt c/o abd pain and nausea since yesterday. Pt also c/o bilateral feet and leg swelling.

## 2023-01-16 ENCOUNTER — Other Ambulatory Visit: Payer: Self-pay

## 2023-01-16 ENCOUNTER — Emergency Department (HOSPITAL_COMMUNITY)
Admission: EM | Admit: 2023-01-16 | Discharge: 2023-01-16 | Disposition: A | Discharge status: Home / Self Care | Payer: Medicaid Other | Attending: Emergency Medicine | Admitting: Emergency Medicine

## 2023-01-16 ENCOUNTER — Emergency Department (HOSPITAL_COMMUNITY): Payer: Medicaid Other

## 2023-01-16 ENCOUNTER — Encounter (HOSPITAL_COMMUNITY): Payer: Self-pay | Admitting: Emergency Medicine

## 2023-01-16 DIAGNOSIS — E119 Type 2 diabetes mellitus without complications: Secondary | ICD-10-CM | POA: Insufficient documentation

## 2023-01-16 DIAGNOSIS — R1013 Epigastric pain: Secondary | ICD-10-CM | POA: Diagnosis present

## 2023-01-16 DIAGNOSIS — J45909 Unspecified asthma, uncomplicated: Secondary | ICD-10-CM | POA: Insufficient documentation

## 2023-01-16 DIAGNOSIS — J449 Chronic obstructive pulmonary disease, unspecified: Secondary | ICD-10-CM | POA: Insufficient documentation

## 2023-01-16 DIAGNOSIS — R109 Unspecified abdominal pain: Secondary | ICD-10-CM

## 2023-01-16 HISTORY — DX: Splenomegaly, not elsewhere classified: R16.1

## 2023-01-16 HISTORY — DX: Unspecified abdominal hernia without obstruction or gangrene: K46.9

## 2023-01-16 HISTORY — DX: Diverticulosis of intestine, part unspecified, without perforation or abscess without bleeding: K57.90

## 2023-01-16 LAB — CBC WITH DIFFERENTIAL/PLATELET
Abs Immature Granulocytes: 0.01 10*3/uL (ref 0.00–0.07)
Basophils Absolute: 0 10*3/uL (ref 0.0–0.1)
Basophils Relative: 1 %
Eosinophils Absolute: 0.4 10*3/uL (ref 0.0–0.5)
Eosinophils Relative: 18 %
HCT: 37 % (ref 36.0–46.0)
Hemoglobin: 12 g/dL (ref 12.0–15.0)
Immature Granulocytes: 0 %
Lymphocytes Relative: 25 %
Lymphs Abs: 0.6 10*3/uL — ABNORMAL LOW (ref 0.7–4.0)
MCH: 30.2 pg (ref 26.0–34.0)
MCHC: 32.4 g/dL (ref 30.0–36.0)
MCV: 93 fL (ref 80.0–100.0)
Monocytes Absolute: 0.3 10*3/uL (ref 0.1–1.0)
Monocytes Relative: 12 %
Neutro Abs: 1.1 10*3/uL — ABNORMAL LOW (ref 1.7–7.7)
Neutrophils Relative %: 44 %
Platelets: 50 10*3/uL — ABNORMAL LOW (ref 150–400)
RBC: 3.98 MIL/uL (ref 3.87–5.11)
RDW: 13.7 % (ref 11.5–15.5)
WBC: 2.4 10*3/uL — ABNORMAL LOW (ref 4.0–10.5)
nRBC: 0 % (ref 0.0–0.2)

## 2023-01-16 LAB — COMPREHENSIVE METABOLIC PANEL
ALT: 24 U/L (ref 0–44)
AST: 40 U/L (ref 15–41)
Albumin: 2.7 g/dL — ABNORMAL LOW (ref 3.5–5.0)
Alkaline Phosphatase: 119 U/L (ref 38–126)
Anion gap: 8 (ref 5–15)
BUN: 6 mg/dL (ref 6–20)
CO2: 23 mmol/L (ref 22–32)
Calcium: 8.3 mg/dL — ABNORMAL LOW (ref 8.9–10.3)
Chloride: 111 mmol/L (ref 98–111)
Creatinine, Ser: 0.58 mg/dL (ref 0.44–1.00)
GFR, Estimated: >60 mL/min (ref >60)
Glucose, Bld: 61 mg/dL — ABNORMAL LOW (ref 70–99)
Potassium: 3.8 mmol/L (ref 3.5–5.1)
Sodium: 142 mmol/L (ref 135–145)
Total Bilirubin: 0.8 mg/dL (ref 0.3–1.2)
Total Protein: 5.9 g/dL — ABNORMAL LOW (ref 6.5–8.1)

## 2023-01-16 LAB — I-STAT BETA HCG BLOOD, ED (MC, WL, AP ONLY): I-stat hCG, quantitative: <5 m[IU]/mL (ref <5)

## 2023-01-16 LAB — ANTINUCLEAR ANTIBODIES, IFA: ANA Ab, IFA: NEGATIVE

## 2023-01-16 LAB — LIPASE, BLOOD: Lipase: 29 U/L (ref 11–51)

## 2023-01-16 LAB — CBG MONITORING, ED
Glucose-Capillary: 62 mg/dL — ABNORMAL LOW (ref 70–99)
Glucose-Capillary: 95 mg/dL (ref 70–99)

## 2023-01-16 LAB — RHEUMATOID FACTOR: Rheumatoid fact SerPl-aCnc: 10.1 IU/mL (ref <14.0)

## 2023-01-16 LAB — TROPONIN I (HIGH SENSITIVITY): Troponin I (High Sensitivity): 2 ng/L (ref <18)

## 2023-01-16 MED ORDER — SUCRALFATE 1 GM/10ML PO SUSP: 1.0000 g | Freq: Three times a day (TID) | ORAL | 0 refills | Status: DC

## 2023-01-16 MED ORDER — PANTOPRAZOLE SODIUM 40 MG IV SOLR
40.0000 mg | Freq: Once | INTRAVENOUS | Status: AC
  Administered 2023-01-16: 40 mg via INTRAVENOUS
  Filled 2023-01-16: qty 10

## 2023-01-16 MED ORDER — ONDANSETRON HCL 4 MG/2ML IJ SOLN
4.0000 mg | Freq: Once | INTRAMUSCULAR | Status: AC
  Administered 2023-01-16: 4 mg via INTRAVENOUS
  Filled 2023-01-16: qty 2

## 2023-01-16 MED ORDER — ALUM & MAG HYDROXIDE-SIMETH 200-200-20 MG/5ML PO SUSP
30.0000 mL | Freq: Once | ORAL | Status: AC
  Administered 2023-01-16: 30 mL via ORAL
  Filled 2023-01-16: qty 30

## 2023-01-16 MED ORDER — PANTOPRAZOLE SODIUM 40 MG PO TBEC: 40.0000 mg | DELAYED_RELEASE_TABLET | Freq: Two times a day (BID) | ORAL | 0 refills | Status: DC

## 2023-01-16 NOTE — ED Provider Notes (Signed)
Emergency Department Provider Note   I have reviewed the triage vital signs and the nursing notes.   HISTORY  Chief Complaint Abdominal Pain   HPI Jaime [name] is a 37 y.o. female with PMH of asthma, cirrhosis, IBS, and PUD presents to the ED with return of epigastric abdominal pain radiating into the chest. She has 2 ED visits this month for similar symptoms and reports this pain is similar to prior. She awoke with pain this AM and took her home pain medication without relief. She reports compliance with pepcid. No hematemesis. No fever or chills. No SOB.     Past Medical History:  Diagnosis Date   Acute encephalopathy 08/31/2020   Adjustment disorder with depressed mood 09/07/2020   Anxiety    Asthma    Chronic abdominal pain    Chronic back pain    Chronic prescription opiate use 02/16/2022   Cirrhosis ([place])    Cirrhosis ([place])    COPD (chronic obstructive pulmonary disease) ([place])    Depression    Diabetes mellitus without complication ([place])    Diabetes mellitus, type II ([place])    Diverticulosis    Fatty liver 03/07/2020   GERD (gastroesophageal reflux disease)    Hepatitis C    Hyperglycemia due to type 2 diabetes mellitus ([place]) 05/24/2020   Hyperlipidemia    IBS (irritable bowel syndrome) 02/16/2022   Insomnia    Luticia Tadros-term current use of methadone for opiate dependence ([place])    Lupus ([place])    Migraine headache    Morbid obesity ([place]) 05/24/2020   Neuropathy    Nocturnal seizures ([facility])    Pain of upper abdomen 07/28/2021   Peptic ulcer    Spleen enlarged    Splenomegaly     Review of Systems  Constitutional: No fever/chills Eyes: No visual changes. ENT: No sore throat. Cardiovascular: Positive chest pain. Respiratory: Denies shortness of breath. Gastrointestinal: Positive epigastric abdominal pain.  No nausea, no vomiting.  No diarrhea.  No constipation. Genitourinary: Negative for dysuria. Musculoskeletal: Negative for back pain. Skin: Negative  for rash. Neurological: Negative for headaches.  ____________________________________________   PHYSICAL EXAM:  VITAL SIGNS: ED Triage Vitals  Enc Vitals Group     BP 01/16/23 0513 123/76     Pulse Rate 01/16/23 0513 (!) 105     Resp 01/16/23 0513 18     Temp 01/16/23 0513 98.3 F (36.8 C)     Temp Source 01/16/23 0513 Oral     SpO2 01/16/23 0513 100 %     Weight 01/16/23 0514 270 lb (122.5 kg)     Height 01/16/23 0514 5\' 10"  (1.778 m)   Constitutional: Alert and oriented. Well appearing and in no acute distress. Eyes: Conjunctivae are normal.  Head: Atraumatic. Nose: No congestion/rhinnorhea. Mouth/Throat: Mucous membranes are moist. Neck: No stridor.   Cardiovascular: Normal rate, regular rhythm. Good peripheral circulation. Grossly normal heart sounds.   Respiratory: Normal respiratory effort.  No retractions. Lungs CTAB. Gastrointestinal: Soft with mild epigastric tenderness. No rebound or guarding. No distention.  Musculoskeletal: No gross deformities of extremities. Neurologic:  Normal speech and language.  Skin:  Skin is warm, dry and intact. No rash noted.   ____________________________________________   LABS (all labs ordered are listed, but only abnormal results are displayed)  Labs Reviewed  COMPREHENSIVE METABOLIC PANEL  LIPASE, BLOOD  CBC WITH DIFFERENTIAL/PLATELET  I-STAT BETA HCG BLOOD, ED ([facility], [facility], [facility] ONLY)  TROPONIN I (HIGH SENSITIVITY)   ____________________________________________  EKG  HR: 105  QTc: 447  Sinus rhythm. Narrow QRS. No ST elevation or depression.  ____________________________________________  RADIOLOGY  No results found.  ____________________________________________   PROCEDURES  Procedure(s) performed:   Procedures  None  ____________________________________________   INITIAL IMPRESSION / ASSESSMENT AND PLAN / ED COURSE  Pertinent labs & imaging results that were available during my care of the patient were  reviewed by me and considered in my medical decision making (see chart for details).   This patient is Presenting for Evaluation of CP, which does require a range of treatment options, and is a complaint that involves a high risk of morbidity and mortality.  The Differential Diagnoses includes but is not exclusive to acute coronary syndrome, aortic dissection, pulmonary embolism, cardiac tamponade, community-acquired pneumonia, pericarditis, musculoskeletal chest wall pain, etc.   Critical Interventions-    Medications  alum & mag hydroxide-simeth (MAALOX/MYLANTA) 200-200-20 MG/5ML suspension 30 mL (has no administration in time range)    Reassessment after intervention: Symptoms improved.    I decided to review pertinent External Data, and in summary patient with two prior ED visits this month. She has had CXR and a CT abdomen/pelvis during those encounters. Esophageal wall thickening at that time along with mesenteric congestive changes and cirrhosis changes.    Clinical Laboratory Tests Ordered, included ***  Radiologic Tests Ordered, included CXR. I independently interpreted the images and agree with radiology interpretation.   Cardiac Monitor Tracing which shows NSR.    Social Determinants of Health Risk patient is a smoker.   Consult complete with  Medical Decision Making: Summary:  Patient presents to the ED with abdominal pain and CP. Similar to prior evaluations. Overall, patient is well appearing. No distress. Normal vitals. Reassuring abdominal exam. Considered CT abdomen/pelvis but will hold pending labs and re-evaluation after treating for esophagitis.   Reevaluation with update and discussion with   ***Considered admission***  Patient's presentation is most consistent with exacerbation of chronic illness.   Disposition:   ____________________________________________  FINAL CLINICAL IMPRESSION(S) / ED DIAGNOSES  Final diagnoses:  None     NEW OUTPATIENT  MEDICATIONS STARTED DURING THIS VISIT:  New Prescriptions   No medications on file    Note:  This document was prepared using Dragon voice recognition software and may include unintentional dictation errors.  Nanda Quinton, MD, [facility] Emergency Medicine

## 2023-01-16 NOTE — Discharge Instructions (Addendum)
We are increasing your Protonix from 20 mg once a day to 40 mg twice per day for the next couple weeks.  Be sure to follow-up with the gastroenterologist.  We are also giving you a medicine called Carafate which can help with abdominal pain.  Otherwise, follow-up with your primary care physician.  If you develop worsening, continued, or recurrent abdominal pain, uncontrolled vomiting, fever, chest or back pain, or any other new/concerning symptoms then return to the ER for evaluation.

## 2023-01-16 NOTE — ED Provider Notes (Signed)
Care transferred to me.  Vital signs are stable.  Troponin is normal and it seems like this is more of an abdominal problem.  Labs are near baseline with no new anemia, change in WBC or platelets.  Glucose is borderline low and so she was allowed to eat and then her glucose came up.  She was given a dose of IV Protonix and Zofran.  She has enough nausea meds at home.  Upon chart review it seems like she is on a low-dose of Protonix at 20 mg daily.  However her CT report showed distal esophageal thickening, concerning for may be some esophagitis.  Given this I will increase her Protonix to 40 mg twice daily and give some Carafate.  Otherwise, she appears stable for discharge home and I do not think repeat CT is needed.   Sherwood Gambler, MD 01/16/23 [phone]

## 2023-01-16 NOTE — ED Notes (Signed)
Pt taken to the bathroom before triage

## 2023-01-16 NOTE — ED Triage Notes (Signed)
C/o abdominal pain x2 weeks in epigastric region. C/o central chest pain that started 2xweeks ago. Pt states feelings of feelings of nausea without emesis. C/o bilateral lower edema that is worse in the evenings. Pt is ambulatory at this time.

## 2023-01-16 NOTE — ED Notes (Signed)
Patient tolerated 8 oz of juice, no nausea vomiting noted

## 2023-01-17 LAB — METHYLMALONIC ACID, SERUM: Methylmalonic Acid, Quantitative: 88 nmol/L (ref 0–378)

## 2023-01-18 LAB — PROTEIN ELECTROPHORESIS, SERUM
A/G Ratio: 1 (ref 0.7–1.7)
Albumin ELP: 3.1 g/dL (ref 2.9–4.4)
Alpha-1-Globulin: 0.3 g/dL (ref 0.0–0.4)
Alpha-2-Globulin: 0.5 g/dL (ref 0.4–1.0)
Beta Globulin: 1.2 g/dL (ref 0.7–1.3)
Gamma Globulin: 1.2 g/dL (ref 0.4–1.8)
Globulin, Total: 3.2 g/dL (ref 2.2–3.9)
Total Protein ELP: 6.3 g/dL (ref 6.0–8.5)

## 2023-01-19 LAB — COPPER, SERUM: Copper: 83 ug/dL (ref 80–158)

## 2023-01-20 ENCOUNTER — Telehealth: Payer: Self-pay | Admitting: Family Medicine

## 2023-01-20 ENCOUNTER — Other Ambulatory Visit: Payer: Medicaid Other | Admitting: Pharmacist

## 2023-01-20 NOTE — Telephone Encounter (Signed)
Pt beiong dc form hosp today 01/20/23  Toc appt made 01/26/23 Toc call needed

## 2023-01-21 ENCOUNTER — Telehealth (INDEPENDENT_AMBULATORY_CARE_PROVIDER_SITE_OTHER): Payer: Self-pay | Admitting: *Deleted

## 2023-01-21 NOTE — Telephone Encounter (Signed)
Received fax from [facility] asking for PA on movantik. Patients last office visit 05/08/22 pt was prescribed movantik. Note in chart from October stating insurance denied movantik due to her only trying and failing one preferred linzess. The other preferred is amitiza. Dr. Jenetta Downer sent in [place]. I called patient to clarify what she was taking. She said to call her mom because she does her meds. Mother is on her dpr and I called. Mom said linzess did not help constipation and neither did amitiza. They are wanting to go back on movantik. I let her know we would send request to insurance.

## 2023-01-21 NOTE — Telephone Encounter (Signed)
Form to send to [facility] filled out and on Dr. Lurline Del desk for signature. Will fax after its signed.

## 2023-01-22 ENCOUNTER — Telehealth: Payer: Self-pay | Admitting: Family Medicine

## 2023-01-22 NOTE — Progress Notes (Signed)
   01/20/2023 Name: Jaime [name] MRN: [id number] DOB: Feb 03, 1986   Unsuccessful outreach to patient today.  VM left encouraging return call.  CMA to f/u and reschedule.   Regina Eck, PharmD, [place] Clinical Pharmacist, [facility]

## 2023-01-22 NOTE — Telephone Encounter (Signed)
Transition Care Management Follow-up Telephone Call Date of discharge and from where: 01/20/23- [facility]  How have you been since you were released from the hospital? OK Any questions or concerns? No WILL WAIT AND DISCUSS WITH THE DR   Items Reviewed: Did the pt receive and understand the discharge instructions provided? Yes  Medications obtained and verified? Yes  Other? No  Any new allergies since your discharge? No  Dietary orders reviewed? Yes Do you have support at home? Yes   Home Care and Equipment/Supplies: Were home health services ordered? no If so, what is the name of the agency? NA  Has the agency set up a time to come to the patient's home? not applicable Were any new equipment or medical supplies ordered?  No What is the name of the medical supply agency? NA Were you able to get the supplies/equipment? not applicable Do you have any questions related to the use of the equipment or supplies? No  Functional Questionnaire: (I = Independent and D = Dependent) ADLs: I  Bathing/Dressing- I  Meal Prep- I  Eating- I  Maintaining continence- I  Transferring/Ambulation- I  Managing Meds- I  Follow up appointments reviewed:  PCP Hospital f/u appt confirmed? Yes  Scheduled to see ILIANA on 01/26/23  Specialist Hospital f/u appt confirmed? No GAVE NUMBER TO GI AND SHE WILL CALL  Are transportation arrangements needed? No  If their condition worsens, is the pt aware to call PCP or go to the Emergency Dept.? Yes Was the patient provided with contact information for the PCP's office or ED? Yes Was to pt encouraged to call back with questions or concerns? Yes

## 2023-01-26 ENCOUNTER — Telehealth: Payer: Self-pay | Admitting: Family Medicine

## 2023-01-26 ENCOUNTER — Encounter: Payer: Self-pay | Admitting: Family Medicine

## 2023-01-26 ENCOUNTER — Ambulatory Visit (INDEPENDENT_AMBULATORY_CARE_PROVIDER_SITE_OTHER): Payer: Medicaid Other | Admitting: Family Medicine

## 2023-01-26 ENCOUNTER — Ambulatory Visit (HOSPITAL_COMMUNITY)
Admission: RE | Admit: 2023-01-26 | Discharge: 2023-01-26 | Disposition: A | Discharge status: Home / Self Care | Payer: Medicaid Other | Source: Ambulatory Visit | Attending: Family Medicine | Admitting: Family Medicine

## 2023-01-26 VITALS — BP 138/87 | HR 99 | Ht 69.0 in | Wt 255.0 lb

## 2023-01-26 DIAGNOSIS — M5440 Lumbago with sciatica, unspecified side: Secondary | ICD-10-CM

## 2023-01-26 DIAGNOSIS — G894 Chronic pain syndrome: Secondary | ICD-10-CM

## 2023-01-26 DIAGNOSIS — Z9189 Other specified personal risk factors, not elsewhere classified: Secondary | ICD-10-CM

## 2023-01-26 DIAGNOSIS — M5441 Lumbago with sciatica, right side: Secondary | ICD-10-CM

## 2023-01-26 DIAGNOSIS — G8929 Other chronic pain: Secondary | ICD-10-CM

## 2023-01-26 MED ORDER — CYCLOBENZAPRINE HCL 10 MG PO TABS: 10.0000 mg | ORAL_TABLET | Freq: Three times a day (TID) | ORAL | 0 refills | Status: DC | PRN

## 2023-01-26 NOTE — Telephone Encounter (Signed)
Spoke with patient. We do not have paperwork from them, they said they will bring it by.

## 2023-01-26 NOTE — Telephone Encounter (Signed)
Pt wants a call back when scan results are in

## 2023-01-26 NOTE — Progress Notes (Signed)
Patient Office Visit   Subjective   Patient ID: Jaime [name], female    DOB: 08/29/1986  Age: 37 y.o. MRN: [id number]  CC:  Chief Complaint  Patient presents with   Follow-up    Patient was admitted to the hospital on 3/16 for esophagitis. Had an endoscopy, CT and [place]. Is having stomach and back pain since being out of the hospital.     HPI Jaime [name] 37 year old female, presents to the clinic for lower back pain She  has a past medical history of Acute encephalopathy (08/31/2020), Adjustment disorder with depressed mood (09/07/2020), Anxiety, Asthma, Chronic abdominal pain, Chronic back pain, Chronic prescription opiate use (02/16/2022), Cirrhosis ([place]), Cirrhosis ([place]), COPD (chronic obstructive pulmonary disease) ([place]), Depression, Diabetes mellitus without complication ([place]), Diabetes mellitus, type II ([place]), Diverticulosis, Fatty liver (03/07/2020), GERD (gastroesophageal reflux disease), Hepatitis C, Hernia, abdominal, Hyperglycemia due to type 2 diabetes mellitus ([place]) (05/24/2020), Hyperlipidemia, IBS (irritable bowel syndrome) (02/16/2022), Insomnia, 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  Back Pain This is a new problem. The current episode started 1 to 4 weeks ago. Patient reports back pain occurs constantly and has been gradually worsening since onset. The pain is present in the lumbar spine. The quality of the pain is described as aching and cramping. The pain radiates to the right thigh and left thigh. The pain is at a severity of 8/10. The pain is The same all the time. The symptoms are aggravated by lying down, position and standing. Stiffness is present All day. Pertinent negatives include no bladder incontinence, bowel incontinence, dysuria, numbness,  paresthesias, pelvic pain, tingling or weakness. Risk factors include lack of exercise, obesity, poor posture and sedentary lifestyle. She has tried analgesics for the symptoms. The treatment provided no relief.       Outpatient Encounter Medications as of 01/26/2023  Medication Sig   Accu-Chek Softclix Lancets lancets USE TO TEST BLOOD SUGAR 4 TIMES A DAY.   albuterol (VENTOLIN HFA) 108 (90 Base) MCG/ACT inhaler Inhale 2 puffs into the lungs every 6 (six) hours as needed for wheezing or shortness of breath.   ARIPiprazole (ABILIFY) 20 MG tablet Take 20 mg by mouth daily.   aspirin EC 81 MG tablet Take 81 mg by mouth daily. Swallow whole.   cephALEXin (KEFLEX) 500 MG capsule Take 1 capsule (500 mg total) by mouth 4 (four) times daily.   cetirizine (ZYRTEC ALLERGY) 10 MG tablet Take 1 tablet (10 mg total) by mouth daily.   clonazePAM (KLONOPIN) 0.5 MG tablet Take 0.5 mg by mouth 2 (two) times daily.   Continuous Blood Gluc Sensor (DEXCOM G6 SENSOR) MISC Change sensor every 10 days.   Continuous Blood Gluc Transmit (DEXCOM G6 TRANSMITTER) MISC USE AS DIRECTED.   cyclobenzaprine (FLEXERIL) 10 MG tablet Take 1 tablet (10 mg total) by mouth 3 (three) times daily as needed for muscle spasms.   dicyclomine (BENTYL) 20 MG tablet Take 1 tablet (20 mg total) by mouth every 6 (six) hours.   etonogestrel (NEXPLANON) 68 MG IMPL implant 1 each by Subdermal route once.   famotidine (PEPCID) 20 MG tablet Take 1 tablet (20 mg total) by mouth 2 (two) times daily.   furosemide (LASIX) 40 MG tablet Take 40 mg by mouth daily.   glipiZIDE (GLUCOTROL XL) 5 MG 24 hr tablet Take 1 tablet (5  mg total) by mouth daily with breakfast.   glucose blood (ACCU-CHEK GUIDE) test strip Use as instructed to check blood glucose four times daily   Insulin Pen Needle (GLOBAL EASE INJECT PEN NEEDLES) 31G X 8 MM MISC Use 1 pen needle 4 times daily for insulin.   Insulin Pen Needle (PEN NEEDLES) 32G X 6 MM MISC 1 each by Does not  apply route 3 (three) times daily.   insulin regular human CONCENTRATED (HUMULIN R U-500 KWIKPEN) 500 UNIT/ML KwikPen Inject 110 Units into the skin 3 (three) times daily with meals.   LINZESS 290 MCG CAPS capsule Take 290 mcg by mouth daily.   liraglutide (VICTOZA) 18 MG/3ML SOPN Inject 1.8 mg into the skin daily.   lisinopril (ZESTRIL) 5 MG tablet TAKE 1 TABLET ONCE DAILY.   lubiprostone (AMITIZA) 24 MCG capsule Take 1 capsule (24 mcg total) by mouth 2 (two) times daily with a meal.   metFORMIN (GLUCOPHAGE) 1000 MG tablet Take 1 tablet (1,000 mg total) by mouth 2 (two) times daily with a meal.   methadone (DOLOPHINE) 10 MG/5ML solution Take 3 mLs by mouth every morning.   methocarbamol (ROBAXIN) 500 MG tablet Take 500 mg by mouth at bedtime.   naloxegol oxalate (MOVANTIK) 25 MG TABS tablet Take 1 tablet (25 mg total) by mouth daily.   nitrofurantoin, macrocrystal-monohydrate, (MACROBID) 100 MG capsule Take 1 capsule (100 mg total) by mouth 2 (two) times daily.   nystatin (MYCOSTATIN/NYSTOP) powder Apply 1 application topically 3 (three) times daily.   ondansetron (ZOFRAN-ODT) 4 MG disintegrating tablet Take 1 tablet (4 mg total) by mouth every 8 (eight) hours as needed for nausea or vomiting.   oxyCODONE (ROXICODONE) 5 MG immediate release tablet Take 1 tablet (5 mg total) by mouth every 4 (four) hours as needed for severe pain.   pantoprazole (PROTONIX) 40 MG tablet Take 1 tablet (40 mg total) by mouth 2 (two) times daily for 14 days.   polyethylene glycol-electrolytes (TRILYTE) 420 g solution Take 4,000 mLs by mouth as directed.   potassium chloride (KLOR-CON M) 10 MEQ tablet Take 10 mEq by mouth 2 (two) times daily.   pregabalin (LYRICA) 75 MG capsule Take 75 mg by mouth in the morning, at noon, in the evening, and at bedtime.    promethazine (PHENERGAN) 12.5 MG tablet Take 1 tablet (12.5 mg total) by mouth every 8 (eight) hours as needed for nausea or vomiting.   propranolol (INDERAL) 10 MG  tablet Take 10 mg by mouth 3 (three) times daily.   RESTASIS 0.05 % ophthalmic emulsion Place 1 drop into both eyes as needed (dry eye).    rosuvastatin (CRESTOR) 10 MG tablet Take 1 tablet (10 mg total) by mouth daily.   spironolactone (ALDACTONE) 100 MG tablet Take 100 mg by mouth daily.    sucralfate (CARAFATE) 1 GM/10ML suspension Take 10 mLs (1 g total) by mouth 4 (four) times daily -  with meals and at bedtime.   Suvorexant (BELSOMRA) 20 MG TABS Take 20 mg by mouth at bedtime.   tirzepatide (MOUNJARO) 7.5 MG/0.5ML Pen Inject 7.5 mg into the skin once a week.   triamcinolone (KENALOG) 0.1 % Apply 1 application topically daily as needed (irritation).   venlafaxine XR (EFFEXOR-XR) 150 MG 24 hr capsule Take total of 225 mg daily (150 mg + 75 mg )   Vitamin D, Ergocalciferol, (DRISDOL) 1.25 MG (50000 UNIT) CAPS capsule Take 1 capsule (50,000 Units total) by mouth every 7 (seven) days. (Patient taking differently:  Take 50,000 Units by mouth every Tuesday.)   vortioxetine HBr (TRINTELLIX) 20 MG TABS tablet Take 20 mg by mouth daily.   [DISCONTINUED] cyclobenzaprine (FLEXERIL) 5 MG tablet Take 1 tablet (5 mg total) by mouth 3 (three) times daily as needed for muscle spasms.   [DISCONTINUED] fluticasone (FLONASE) 50 MCG/ACT nasal spray Place 2 sprays into both nostrils daily.   [DISCONTINUED] Glecaprevir-Pibrentasvir (MAVYRET) 100-40 MG TABS Take 3 tablets by mouth daily.   [DISCONTINUED] loperamide (IMODIUM A-D) 2 MG tablet Take 1 tablet (2 mg total) by mouth 4 (four) times daily as needed for diarrhea or loose stools.   [DISCONTINUED] menthol-cetylpyridinium (CEPACOL) 3 MG lozenge Take 1 lozenge (3 mg total) by mouth as needed for sore throat.   [DISCONTINUED] Multiple Vitamin (MULTIVITAMIN) tablet Take 1 tablet by mouth daily.   [DISCONTINUED] polyethylene glycol (MIRALAX / GLYCOLAX) 17 g packet Take 17 g by mouth daily as needed for mild constipation.   [DISCONTINUED] venlafaxine XR (EFFEXOR-XR)  75 MG 24 hr capsule To take along with 150mg  for a total of 225 mg daily   No facility-administered encounter medications on file as of 01/26/2023.    Past Surgical History:  Procedure Laterality Date   BIOPSY  11/25/2021   Procedure: BIOPSY;  Surgeon: Harvel Quale, MD;  [facility];  Service: Gastroenterology;;   CHOLECYSTECTOMY     COLONOSCOPY WITH PROPOFOL N/A 11/25/2021   Procedure: COLONOSCOPY WITH PROPOFOL;  Surgeon: Harvel Quale, MD;  Location: [facility];  Service: Gastroenterology;  Laterality: N/A;  805   COLONOSCOPY WITH PROPOFOL N/A 03/31/2022   Procedure: COLONOSCOPY WITH PROPOFOL;  Surgeon: Harvel Quale, MD;  Location: [facility];  Service: Gastroenterology;  Laterality: N/A;  730   ESOPHAGOGASTRODUODENOSCOPY  06/2020   done at [facility], candida in upper esophagus (treated with diflucan), ulcerative esophagitis at GE junction, gastritis in stomach, single ulcer in duodenal bulb, with duodenal mucosa showing no abnormality. No presence of varices   ESOPHAGOGASTRODUODENOSCOPY (EGD) WITH PROPOFOL N/A 11/25/2021   Procedure: ESOPHAGOGASTRODUODENOSCOPY (EGD) WITH PROPOFOL;  Surgeon: Harvel Quale, MD;  [facility];  Service: Gastroenterology;  Laterality: N/A;   ESOPHAGOGASTRODUODENOSCOPY (EGD) WITH PROPOFOL N/A 03/31/2022   Procedure: ESOPHAGOGASTRODUODENOSCOPY (EGD) WITH PROPOFOL;  Surgeon: Harvel Quale, MD;  [facility];  Service: Gastroenterology;  Laterality: N/A;    Review of Systems  Constitutional:  Negative for chills and fever.  Respiratory:  Negative for shortness of breath.   Cardiovascular:  Negative for chest pain.  Gastrointestinal:  Negative for nausea and vomiting.  Musculoskeletal:  Positive for back pain.  Neurological:  Negative for dizziness and headaches.      Objective    BP 138/87   Pulse 99   Ht 5\' 9"  (1.753 m)   Wt 255 lb (115.7 kg)   SpO2 97%    BMI 37.66 kg/m   Physical Exam HENT:     Head: Normocephalic.     Nose: Nose normal.  Cardiovascular:     Rate and Rhythm: Normal rate.     Pulses: Normal pulses.  Pulmonary:     Effort: Pulmonary effort is normal.     Breath sounds: Normal breath sounds.  Musculoskeletal:     Lumbar back: No swelling, edema, signs of trauma or tenderness. Normal range of motion. Positive right straight leg raise test. Negative left straight leg raise test.  Skin:    General: Skin is warm and dry.     Capillary Refill: Capillary  refill takes less than 2 seconds.  Neurological:     General: No focal deficit present.     Mental Status: She is alert.  Psychiatric:        Thought Content: Thought content normal.       Assessment & Plan:  Acute bilateral low back pain with sciatica, sciatica laterality unspecified -     DG Lumbar Spine 2-3 Views; Future -     Cyclobenzaprine HCl; Take 1 tablet (10 mg total) by mouth 3 (three) times daily as needed for muscle spasms.  Dispense: 30 tablet; Refill: 0  Chronic pain syndrome -     Ambulatory referral to Pain Clinic  At risk for arrhythmia Assessment & Plan: EKG ordered  Patient was discharged from hospital on  3/20  with Bactrim and Fluconazole daily for esophageal candidiasis. EKG needed  monitor  prolong QTc.   Orders: -     EKG 12-Lead  Chronic midline low back pain with right-sided sciatica Assessment & Plan: Lumbar Xray ordered- will follow up awaiting results  Increased Flexeril 10 mg PRN Explained to patient Non pharmacological interventions include the use of ice or heat, rest, recommend range of motion exercises, gentle stretching. The use of NSAIDs for pain management.  Follow up for worsening or persistent symptoms. Patient verbalizes understanding regarding plan of care and all questions answered.    Chronic pain disorder Assessment & Plan: Referral to pain management      No follow-ups on file.   Renard Hamper Ria Comment,  FNP

## 2023-01-26 NOTE — Telephone Encounter (Signed)
Pt wants to know status of pwk that was dropped off 3 wks ago?

## 2023-01-26 NOTE — Assessment & Plan Note (Signed)
Referral to pain management.

## 2023-01-26 NOTE — Assessment & Plan Note (Signed)
EKG ordered  Patient was discharged from hospital on  3/20  with Bactrim and Fluconazole daily for esophageal candidiasis. EKG needed  monitor  prolong QTc.

## 2023-01-26 NOTE — Patient Instructions (Signed)
It was pleasure meeting with you today. Follow up with your primary health provider if any health concerns arises. If symptoms worsen please contact your primary care provider and/or visit the emergency department.  

## 2023-01-26 NOTE — Assessment & Plan Note (Addendum)
Lumbar Xray ordered- will follow up awaiting results  Increased Flexeril 10 mg PRN Explained to patient Non pharmacological interventions include the use of ice or heat, rest, recommend range of motion exercises, gentle stretching. The use of NSAIDs for pain management.  Follow up for worsening or persistent symptoms. Patient verbalizes understanding regarding plan of care and all questions answered.

## 2023-01-27 NOTE — Telephone Encounter (Signed)
Image review has not been sent to [place] yet, we will call when it is.

## 2023-01-28 ENCOUNTER — Telehealth: Payer: Self-pay | Admitting: Family Medicine

## 2023-01-28 NOTE — Telephone Encounter (Signed)
Patient called about x-ray results. 

## 2023-01-28 NOTE — Telephone Encounter (Signed)
Form signed at end of day on 01/27/23 and I faxed this morning. Await response.

## 2023-01-29 ENCOUNTER — Telehealth: Payer: Self-pay | Admitting: Family Medicine

## 2023-01-29 ENCOUNTER — Other Ambulatory Visit: Payer: Self-pay | Admitting: Family Medicine

## 2023-01-29 DIAGNOSIS — G8929 Other chronic pain: Secondary | ICD-10-CM

## 2023-01-29 NOTE — Telephone Encounter (Signed)
Patient called about xray report.

## 2023-01-29 NOTE — Telephone Encounter (Signed)
I will let her know by the end of the day

## 2023-02-01 ENCOUNTER — Ambulatory Visit: Payer: No Typology Code available for payment source | Admitting: Licensed Clinical Social Worker

## 2023-02-01 DIAGNOSIS — F419 Anxiety disorder, unspecified: Secondary | ICD-10-CM

## 2023-02-02 ENCOUNTER — Ambulatory Visit (INDEPENDENT_AMBULATORY_CARE_PROVIDER_SITE_OTHER): Payer: Medicaid Other | Admitting: Internal Medicine

## 2023-02-02 ENCOUNTER — Encounter: Payer: Self-pay | Admitting: Internal Medicine

## 2023-02-02 VITALS — BP 154/79 | HR 118 | Resp 16 | Ht 69.0 in | Wt 244.0 lb

## 2023-02-02 DIAGNOSIS — G8929 Other chronic pain: Secondary | ICD-10-CM

## 2023-02-02 DIAGNOSIS — M5441 Lumbago with sciatica, right side: Secondary | ICD-10-CM | POA: Diagnosis not present

## 2023-02-02 DIAGNOSIS — F17200 Nicotine dependence, unspecified, uncomplicated: Secondary | ICD-10-CM

## 2023-02-02 MED ORDER — DULOXETINE HCL 30 MG PO CPEP: 30.0000 mg | ORAL_CAPSULE | Freq: Every day | ORAL | 0 refills | Status: DC

## 2023-02-02 MED ORDER — NICOTINE POLACRILEX 4 MG MT GUM: 4.0000 mg | CHEWING_GUM | OROMUCOSAL | 1 refills | Status: DC | PRN

## 2023-02-02 NOTE — Patient Instructions (Signed)
Thank you, Ms.Willaim Bane for allowing [place] to provide your care today.   Start Cymbalta 30 mg daily for your back pain  Start Nicotine Gum: Take one piece of gum every 2 hours up to week 7. Then week 7-9 , use one piece of gum every 4 hours. Week 10-12 use one piece of gum every 8 hours.       Tamsen Snider, M.D.

## 2023-02-02 NOTE — Progress Notes (Signed)
   HPI:Ms.Jaime [name] is a 37 y.o. female with history including but not limited to [place] cirrhosis, chronic hepatitis C, previous IVDU , OUD previously on methandone , MDD, polypharmacy , chronic generalized pain, and chronic back pain who presents for evaluation of back pain. Patient was seen by PCP last week and had x-rays of her lumbar spine showing DDD.  She was referred to for PT and prescribed Flexeril . Avoiding NSAIDS due to history of gastric ulcers. She  continues to be in a lot of pain.  Her PCP also referred her to pain management to help with chronic pain. She has been referred to pharmacy to review medications. Patient would like a short course of stronger medication until she can get to pain specialist. I recommended avoiding opioids. For the details of today's visit, please refer to the assessment and plan.  Physical Exam: Vitals:   02/02/23 1516  BP: (!) 154/79  Pulse: (!) 118  Resp: 16  SpO2: 96%  Weight: 244 lb (110.7 kg)  Height: 5\' 9"  (1.753 m)     Physical Exam Constitutional:      General: She is not in acute distress.    Appearance: She is obese.  Cardiovascular:     Rate and Rhythm: Regular rhythm. Tachycardia present.  Musculoskeletal:        General: No deformity or signs of injury.     Comments: No gross deformity, scoliosis. No midline or bony TTP. FROM. Strength LEs 5/5 all muscle groups.   Sensation intact to light touch bilaterally.    Neurological:     General: No focal deficit present.     Sensory: No sensory deficit.    EKG review from 3/26, NSR  Assessment & Plan:   Jaime [name] was seen today for back pain.  Chronic midline low back pain with right-sided sciatica Assessment & Plan: Reviewed Xray. No focal findings on exam or red flag symptoms.  Start Cymbalta 30 mg Follow up with referral to pain specialist.    Morbid obesity Assessment & Plan: BMI 36 with chronic low back pain. Reports previous referral to PREP has not gone  through. I do not see under referrals. Referral placed to PREP at [facility]   Orders: -     Amb Referral To Provider Referral Exercise Program (P.R.E.P)  Tobacco use disorder Overview: Formatting of this note might be different from the original. Last Assessment & Plan: Formatting of this note might be different from the original. -Encourage cessation Last Assessment & Plan: Formatting of this note might be different from the original. -Encourage cessation  Assessment & Plan: Patient currently smoking 1/2 pack per day. She found nicotine patches helpful in hospital. Her insurance will cover nicotine gum. She smokes every morning when she wakes up. She will start with chewing one piece every 2 hours as needed, at week 7 she will chew one piece of gum every 4 hours as needed , and at week 10 she will chew one piece of gum every 8 hours as needed.    Other orders -     DULoxetine HCl; Take 1 capsule (30 mg total) by mouth daily.  Dispense: 90 capsule; Refill: 0 -     Nicotine Polacrilex; Take 1 each (4 mg total) by mouth as needed for smoking cessation.  Dispense: 100 tablet; Refill: 1      Lorene Dy, MD

## 2023-02-02 NOTE — Assessment & Plan Note (Signed)
Patient currently smoking 1/2 pack per day. She found nicotine patches helpful in hospital. Her insurance will cover nicotine gum. She smokes every morning when she wakes up. She will start with chewing one piece every 2 hours as needed, at week 7 she will chew one piece of gum every 4 hours as needed , and at week 10 she will chew one piece of gum every 8 hours as needed.

## 2023-02-02 NOTE — Assessment & Plan Note (Signed)
Reviewed Xray. No focal findings on exam or red flag symptoms.  Start Cymbalta 30 mg Follow up with referral to pain specialist.

## 2023-02-02 NOTE — Assessment & Plan Note (Signed)
BMI 36 with chronic low back pain. Reports previous referral to PREP has not gone through. I do not see under referrals. Referral placed to PREP at [facility]

## 2023-02-02 NOTE — Progress Notes (Unsigned)
Jaime [name], [name]   CLINIC:  Medical Oncology/Hematology  PCP:  Juanda Chance, [place]. [address] [place] [address] [phone]   REASON FOR VISIT:  Follow-up for thrombocytopenia  PRIOR THERAPY: None  CURRENT THERAPY: Under workup  INTERVAL HISTORY:   [name] 37 y.o. female returns for routine follow-up of thrombocytopenia.  She was seen for initial consultation by Dr. Delton Coombes on 01/14/2023.  At today's visit, she reports feeling fair.  She denies any changes in her health since her initial visit with Dr. Delton Coombes 3 weeks ago.  She continues to report easy bruising for the last 10 years.  She has a history of rectal bleeding, but denies any recent hematochezia, melena, or epistaxis.  She has not had menstrual period since January 2018 due to implant.  She denies any B symptoms or lymphadenopathy.  She reports baseline fatigue.  She reports pica cravings for ice.  She does not take any iron supplements at home. She has 40% energy and 25% appetite. She reports that she has been losing weight unintentionally due to poor appetite.  ASSESSMENT & PLAN:  1.   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 Dental extractions 1 and half year ago without any excessive bleeding.  Reports prior history of rectal bleeding, but no active bleeding.    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  Cirrhotic changes in the liver. - She follows with Dr. Jenetta Downer for cirrhosis and other GI complaints.  She is not yet on liver transplant list. - Hematology workup (01/14/2023): Negative rheumatoid factor, ANA, lupus anticoagulant. Normal B12, folate, copper, MMA. Immature platelet fraction 7.3%.  Reticulocytes 1.6%.  LDH normal. SPEP normal.  Mildly elevated kappa free  light chain 43.6, elevated lambda 34.1.  Normal FLC ratio 1.28. - Most recent CBC/D (01/14/2023): Platelets 57, WBC 2.9/ANC 1.6.  Normal hemoglobin. 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  Less likely would be ITP or bone marrow disorder at this time.  No evidence of nutritional deficiency. - PLAN: Will continue to monitor with repeat CBC/D and office visit in 3 months  - We will repeat free light chains, SPEP, and immunofixation with next labs.  We will also check CMP and Cystatin C to evaluate for any underlying kidney disease.   - Other considerations: If platelet count continues to drop, will consider splenic embolization to improve cytopenias related to hypersplenism If she needs pre-procedure increase of platelets, will use avotrombopag If she is on transplant list, we will consider bone marrow biopsy to rule out underlying process  2.  Iron deficiency without anemia - Labs from 01/14/2023 show normal Hgb 12.5/MCV 91.2, but with some mild iron deficiency (ferritin 66, iron saturation 11%) - Reports prior history of rectal bleeding, but no active bleeding.  LMP in 2018 (currently has implant) - Colonoscopy (03/31/2022): Preparation of the colon was poor.  Hemorrhoids found on perianal exam. 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  No gastric varices.  5 nonbleeding cratered gastric ulcers with a clear ulcer base in the gastric antrum. - PLAN: Recommend iron repletion with ferrous sulfate 325 mg EOD - We will recheck CBC/D, iron/TIBC, and ferritin in 3 months  3.  Weight loss - Patient reports very poor appetite, lost about 20 pounds in the past 2 months - PLAN: Discussed importance of and techniques for adequate nutrition.  We will continue to keep close watch on her weight and consider additional imaging if she has ongoing weight  loss.  4.  Social/family history: - She is currently on disability.  Previously she worked in Therapist, art job at a call center.  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  No history of alcohol use.  Current active smoker half pack to 1 pack of cigarettes per day for 5 years. - No family history of low platelets.  Father had melanoma.  No family history of leukemia.  PLAN SUMMARY: >> Labs in 3 months = CBC/D, iron/TIBC, ferritin, CMP, Cystatin C, light chains, SPEP, immunofixation >> OFFICE visit in 3 months (1 week after labs)      REVIEW OF SYSTEMS:   Review of Systems  Constitutional:  Positive for appetite change, fatigue and unexpected weight change. Negative for chills, diaphoresis and fever.  HENT:   Negative for lump/mass and nosebleeds.   Eyes:  Negative for eye problems.  Respiratory:  Negative for cough, hemoptysis and shortness of breath.   Cardiovascular:  Negative for chest pain, leg swelling and palpitations.  Gastrointestinal:  Negative for abdominal pain, blood in stool, constipation, diarrhea, nausea and vomiting.  Genitourinary:  Negative for hematuria.   Musculoskeletal:  Positive for arthralgias and back pain.  Skin: Negative.   Neurological:  Positive for numbness. Negative for dizziness, headaches and light-headedness.  Hematological:  Does not bruise/bleed easily.  Psychiatric/Behavioral:  Positive for sleep disturbance.      PHYSICAL EXAM:  ECOG PERFORMANCE STATUS: 1 - Symptomatic but completely ambulatory  There were no vitals filed for this visit. There were no vitals filed for this visit. Physical Exam Constitutional:      Appearance: Normal appearance. She is morbidly obese.  Cardiovascular:     Heart sounds: Normal heart sounds.  Pulmonary:     Breath sounds: Normal breath sounds.  Neurological:     General: No focal deficit present.     Mental Status: Mental status is at baseline.  Psychiatric:        Behavior: Behavior normal. Behavior  is cooperative.     PAST MEDICAL/SURGICAL HISTORY:  Past Medical History:  Diagnosis Date   Acute encephalopathy 08/31/2020   Adjustment disorder with depressed mood 09/07/2020   Anxiety    Asthma    Chronic abdominal pain    Chronic back pain    Chronic prescription opiate use 02/16/2022   Cirrhosis    Cirrhosis    COPD (chronic obstructive pulmonary disease)    Depression    Diabetes mellitus without complication    Diabetes mellitus, type II    Diverticulosis    Fatty liver 03/07/2020   GERD (gastroesophageal reflux disease)    Hepatitis C    Hernia, abdominal    Hyperglycemia due to type 2 diabetes mellitus 05/24/2020   Hyperlipidemia    IBS (irritable bowel syndrome) 02/16/2022   Insomnia    Long-term current use of methadone for opiate dependence    Lupus    Migraine headache    Morbid obesity 05/24/2020   Neuropathy    Nocturnal seizures    Pain of upper abdomen 07/28/2021   Peptic ulcer    Spleen enlarged    Splenomegaly    Past Surgical History:  Procedure Laterality Date   BIOPSY  11/25/2021   Procedure: BIOPSY;  Surgeon: Harvel Quale, MD;  [facility];  Service: Gastroenterology;;   CHOLECYSTECTOMY     COLONOSCOPY WITH PROPOFOL N/A 11/25/2021   Procedure: COLONOSCOPY WITH PROPOFOL;  Surgeon: Harvel Quale, MD;  [facility];  Service: Gastroenterology;  Laterality: N/A;  805   COLONOSCOPY WITH PROPOFOL N/A 03/31/2022   Procedure: COLONOSCOPY WITH PROPOFOL;  Surgeon: Harvel Quale, MD;  [facility];  Service: Gastroenterology;  Laterality: N/A;  730   ESOPHAGOGASTRODUODENOSCOPY  06/2020   done at [facility], candida in upper esophagus (treated with diflucan), ulcerative esophagitis at GE junction, gastritis in stomach, single ulcer in duodenal bulb, with duodenal mucosa showing no abnormality. No presence of varices   ESOPHAGOGASTRODUODENOSCOPY (EGD) WITH PROPOFOL N/A 11/25/2021   Procedure:  ESOPHAGOGASTRODUODENOSCOPY (EGD) WITH PROPOFOL;  Surgeon: Harvel Quale, MD;  [facility];  Service: Gastroenterology;  Laterality: N/A;   ESOPHAGOGASTRODUODENOSCOPY (EGD) WITH PROPOFOL N/A 03/31/2022   Procedure: ESOPHAGOGASTRODUODENOSCOPY (EGD) WITH PROPOFOL;  Surgeon: Harvel Quale, MD;  [facility];  Service: Gastroenterology;  Laterality: N/A;    SOCIAL HISTORY:  Social History   Socioeconomic History   Marital status: Single    Spouse name: Not on file   Number of children: Not on file   Years of education: Not on file   Highest education level: Not on file  Occupational History   Not on file  Tobacco Use   Smoking status: Every Day    Packs/day: .5    Types: Cigarettes   Smokeless tobacco: Never  Vaping Use   Vaping Use: Former   Substances: Nicotine, Flavoring  Substance and Sexual Activity   Alcohol use: No   Drug use: No   Sexual activity: Not Currently    Birth control/protection: Implant  Other Topics Concern   Not on file  Social History Narrative   Not on file   Social Determinants of Health   Financial Resource Strain: Medium Risk (05/09/2020)   Overall Financial Resource Strain (CARDIA)    Difficulty of Paying Living Expenses: Somewhat hard  Food Insecurity: Food Insecurity Present (01/14/2023)   Hunger Vital Sign    Worried About Running Out of Food in the Last Year: Sometimes true    Ran Out of Food in the Last Year: Never true  Transportation Needs: No Transportation Needs (01/14/2023)   PRAPARE - Hydrologist (Medical): No    Lack of Transportation (Non-Medical): No  Physical Activity: Insufficiently Active (05/09/2020)   Exercise Vital Sign    Days of Exercise per Week: 1 day    Minutes of Exercise per Session: 20 min  Stress: Stress Concern Present (05/09/2020)   [place]    Feeling of Stress : Very much   Social Connections: Moderately Isolated (05/09/2020)   Social Connection and Isolation Panel [NHANES]    Frequency of Communication with Friends and Family: More than three times a week    Frequency of Social Gatherings with Friends and Family: Twice a week    Attends Religious Services: More than 4 times per year    Active Member of [employer] or Organizations: No    Attends Archivist Meetings: Never    Marital Status: Never married  Intimate Partner Violence: Not At Risk (01/14/2023)   Humiliation, Afraid, Rape, and Kick questionnaire    Fear of Current or Ex-Partner: No    Emotionally  Abused: No    Physically Abused: No    Sexually Abused: No    FAMILY HISTORY:  Family History  Problem Relation Age of Onset   Hyperlipidemia Maternal Grandfather     CURRENT MEDICATIONS:  Outpatient Encounter Medications as of 02/03/2023  Medication Sig Note   Accu-Chek Softclix Lancets lancets USE TO TEST BLOOD SUGAR 4 TIMES A DAY.    albuterol (VENTOLIN HFA) 108 (90 Base) MCG/ACT inhaler Inhale 2 puffs into the lungs every 6 (six) hours as needed for wheezing or shortness of breath.    ARIPiprazole (ABILIFY) 20 MG tablet Take 20 mg by mouth daily.    aspirin EC 81 MG tablet Take 81 mg by mouth daily. Swallow whole.    cephALEXin (KEFLEX) 500 MG capsule Take 1 capsule (500 mg total) by mouth 4 (four) times daily.    cetirizine (ZYRTEC ALLERGY) 10 MG tablet Take 1 tablet (10 mg total) by mouth daily.    clonazePAM (KLONOPIN) 0.5 MG tablet Take 0.5 mg by mouth 2 (two) times daily.    Continuous Blood Gluc Sensor (DEXCOM G6 SENSOR) MISC Change sensor every 10 days.    Continuous Blood Gluc Transmit (DEXCOM G6 TRANSMITTER) MISC USE AS DIRECTED.    cyclobenzaprine (FLEXERIL) 10 MG tablet Take 1 tablet (10 mg total) by mouth 3 (three) times daily as needed for muscle spasms.    dicyclomine (BENTYL) 20 MG tablet Take 1 tablet (20 mg total) by mouth every 6 (six) hours.    DULoxetine (CYMBALTA) 30 MG  capsule Take 1 capsule (30 mg total) by mouth daily.    etonogestrel (NEXPLANON) 68 MG IMPL implant 1 each by Subdermal route once.    famotidine (PEPCID) 20 MG tablet Take 1 tablet (20 mg total) by mouth 2 (two) times daily.    furosemide (LASIX) 40 MG tablet Take 40 mg by mouth daily.    glipiZIDE (GLUCOTROL XL) 5 MG 24 hr tablet Take 1 tablet (5 mg total) by mouth daily with breakfast.    glucose blood (ACCU-CHEK GUIDE) test strip Use as instructed to check blood glucose four times daily    Insulin Pen Needle (GLOBAL EASE INJECT PEN NEEDLES) 31G X 8 MM MISC Use 1 pen needle 4 times daily for insulin.    Insulin Pen Needle (PEN NEEDLES) 32G X 6 MM MISC 1 each by Does not apply route 3 (three) times daily.    insulin regular human CONCENTRATED (HUMULIN R U-500 KWIKPEN) 500 UNIT/ML KwikPen Inject 110 Units into the skin 3 (three) times daily with meals.    LINZESS 290 MCG CAPS capsule Take 290 mcg by mouth daily.    liraglutide (VICTOZA) 18 MG/3ML SOPN Inject 1.8 mg into the skin daily.    lisinopril (ZESTRIL) 5 MG tablet TAKE 1 TABLET ONCE DAILY.    lubiprostone (AMITIZA) 24 MCG capsule Take 1 capsule (24 mcg total) by mouth 2 (two) times daily with a meal.    metFORMIN (GLUCOPHAGE) 1000 MG tablet Take 1 tablet (1,000 mg total) by mouth 2 (two) times daily with a meal.    methocarbamol (ROBAXIN) 500 MG tablet Take 500 mg by mouth at bedtime.    naloxegol oxalate (MOVANTIK) 25 MG TABS tablet Take 1 tablet (25 mg total) by mouth daily.    nicotine polacrilex (NICORETTE) 4 MG gum Take 1 each (4 mg total) by mouth as needed for smoking cessation.    nitrofurantoin, macrocrystal-monohydrate, (MACROBID) 100 MG capsule Take 1 capsule (100 mg total) by mouth 2 (two)  times daily.    nystatin (MYCOSTATIN/NYSTOP) powder Apply 1 application topically 3 (three) times daily.    ondansetron (ZOFRAN-ODT) 4 MG disintegrating tablet Take 1 tablet (4 mg total) by mouth every 8 (eight) hours as needed for nausea or  vomiting.    oxyCODONE (ROXICODONE) 5 MG immediate release tablet Take 1 tablet (5 mg total) by mouth every 4 (four) hours as needed for severe pain.    pantoprazole (PROTONIX) 40 MG tablet Take 1 tablet (40 mg total) by mouth 2 (two) times daily for 14 days.    polyethylene glycol-electrolytes (TRILYTE) 420 g solution Take 4,000 mLs by mouth as directed.    potassium chloride (KLOR-CON M) 10 MEQ tablet Take 10 mEq by mouth 2 (two) times daily.    pregabalin (LYRICA) 75 MG capsule Take 75 mg by mouth in the morning, at noon, in the evening, and at bedtime.     promethazine (PHENERGAN) 12.5 MG tablet Take 1 tablet (12.5 mg total) by mouth every 8 (eight) hours as needed for nausea or vomiting.    propranolol (INDERAL) 10 MG tablet Take 10 mg by mouth 3 (three) times daily.    RESTASIS 0.05 % ophthalmic emulsion Place 1 drop into both eyes as needed (dry eye).     rosuvastatin (CRESTOR) 10 MG tablet Take 1 tablet (10 mg total) by mouth daily.    spironolactone (ALDACTONE) 100 MG tablet Take 100 mg by mouth daily.     sucralfate (CARAFATE) 1 GM/10ML suspension Take 10 mLs (1 g total) by mouth 4 (four) times daily -  with meals and at bedtime.    Suvorexant (BELSOMRA) 20 MG TABS Take 20 mg by mouth at bedtime.    tirzepatide (MOUNJARO) 7.5 MG/0.5ML Pen Inject 7.5 mg into the skin once a week. 01/07/2023: Tuesday    triamcinolone (KENALOG) 0.1 % Apply 1 application topically daily as needed (irritation).    Vitamin D, Ergocalciferol, (DRISDOL) 1.25 MG (50000 UNIT) CAPS capsule Take 1 capsule (50,000 Units total) by mouth every 7 (seven) days. (Patient taking differently: Take 50,000 Units by mouth every Tuesday.)    No facility-administered encounter medications on file as of 02/03/2023.    ALLERGIES:  Allergies  Allergen Reactions   Iodine-131 Anaphylaxis, Shortness Of Breath and Swelling   Ivp Dye [Iodinated Contrast Media] Anaphylaxis    Skin gets very red, unable to walk    Ketorolac  Tromethamine Shortness Of Breath   Tylenol [Acetaminophen] Other (See Comments)    Due to cirrhosis    Vancomycin Swelling    Facial swelling,    Gabapentin Itching   Nsaids Other (See Comments)    Flares ulcers   Suboxone [Buprenorphine Hcl-Naloxone Hcl]     Withdrawal symptoms     LABORATORY DATA:  I have reviewed the labs as listed.  CBC    Component Value Date/Time   WBC 2.4 (L) 01/16/2023 0621   RBC 3.98 01/16/2023 0621   HGB 12.0 01/16/2023 0621   HGB 13.5 01/01/2023 1408   HCT 37.0 01/16/2023 0621   HCT 41.9 01/01/2023 1408   PLT 50 (L) 01/16/2023 0621   PLT 55 (LL) 01/01/2023 1408   MCV 93.0 01/16/2023 0621   MCV 92 01/01/2023 1408   MCH 30.2 01/16/2023 0621   MCHC 32.4 01/16/2023 0621   RDW 13.7 01/16/2023 0621   RDW 13.1 01/01/2023 1408   LYMPHSABS 0.6 (L) 01/16/2023 0621   LYMPHSABS 0.7 01/01/2023 1408   MONOABS 0.3 01/16/2023 0621   EOSABS 0.4 01/16/2023  [id number]   EOSABS 0.1 01/01/2023 1408   BASOSABS 0.0 01/16/2023 0621   BASOSABS 0.0 01/01/2023 1408      Latest Ref Rng & Units 01/16/2023    6:21 AM 01/07/2023    6:30 AM 01/04/2023    8:33 PM  CMP  Glucose 70 - 99 mg/dL 61  86  263   BUN 6 - 20 mg/dL 6  7  7    Creatinine 0.44 - 1.00 mg/dL 0.58  0.53  0.59   Sodium 135 - 145 mmol/L 142  139  136   Potassium 3.5 - 5.1 mmol/L 3.8  3.8  4.2   Chloride 98 - 111 mmol/L 111  109  110   CO2 22 - 32 mmol/L 23  23  20    Calcium 8.9 - 10.3 mg/dL 8.3  8.1  7.7   Total Protein 6.5 - 8.1 g/dL 5.9  5.8  6.4   Total Bilirubin 0.3 - 1.2 mg/dL 0.8  0.8  1.2   Alkaline Phos 38 - 126 U/L 119  113  121   AST 15 - 41 U/L 40  39  45   ALT 0 - 44 U/L 24  26  30      DIAGNOSTIC IMAGING:  I have independently reviewed the relevant imaging and discussed with the patient.   WRAP UP:  All questions were answered. The patient knows to call the clinic with any problems, questions or concerns.  Medical decision making: Moderate  Time spent on visit: I spent 20 minutes  counseling the patient face to face. The total time spent in the appointment was 30 minutes and more than 50% was on counseling.  Harriett Rush, PA-C  02/03/2023 9:37 AM

## 2023-02-03 ENCOUNTER — Other Ambulatory Visit: Payer: Self-pay

## 2023-02-03 ENCOUNTER — Inpatient Hospital Stay (HOSPITAL_BASED_OUTPATIENT_CLINIC_OR_DEPARTMENT_OTHER): Payer: Medicaid Other | Admitting: Physician Assistant

## 2023-02-03 ENCOUNTER — Ambulatory Visit: Payer: Medicaid Other | Admitting: Family Medicine

## 2023-02-03 ENCOUNTER — Other Ambulatory Visit: Payer: Self-pay | Admitting: Family Medicine

## 2023-02-03 VITALS — BP 135/94 | HR 109 | Temp 98.4°F | Resp 17 | Ht 69.0 in | Wt 239.1 lb

## 2023-02-03 DIAGNOSIS — K7460 Unspecified cirrhosis of liver: Secondary | ICD-10-CM | POA: Insufficient documentation

## 2023-02-03 DIAGNOSIS — D696 Thrombocytopenia, unspecified: Secondary | ICD-10-CM | POA: Insufficient documentation

## 2023-02-03 DIAGNOSIS — D6959 Other secondary thrombocytopenia: Secondary | ICD-10-CM

## 2023-02-03 DIAGNOSIS — Z79899 Other long term (current) drug therapy: Secondary | ICD-10-CM | POA: Insufficient documentation

## 2023-02-03 DIAGNOSIS — D731 Hypersplenism: Secondary | ICD-10-CM

## 2023-02-03 DIAGNOSIS — D72819 Decreased white blood cell count, unspecified: Secondary | ICD-10-CM | POA: Insufficient documentation

## 2023-02-03 DIAGNOSIS — B1920 Unspecified viral hepatitis C without hepatic coma: Secondary | ICD-10-CM | POA: Insufficient documentation

## 2023-02-03 DIAGNOSIS — M545 Low back pain, unspecified: Secondary | ICD-10-CM | POA: Insufficient documentation

## 2023-02-03 DIAGNOSIS — M5459 Other low back pain: Secondary | ICD-10-CM | POA: Insufficient documentation

## 2023-02-03 DIAGNOSIS — F17210 Nicotine dependence, cigarettes, uncomplicated: Secondary | ICD-10-CM | POA: Insufficient documentation

## 2023-02-03 DIAGNOSIS — E611 Iron deficiency: Secondary | ICD-10-CM

## 2023-02-03 DIAGNOSIS — I82432 Acute embolism and thrombosis of left popliteal vein: Secondary | ICD-10-CM | POA: Insufficient documentation

## 2023-02-03 DIAGNOSIS — G8929 Other chronic pain: Secondary | ICD-10-CM | POA: Diagnosis not present

## 2023-02-03 MED ORDER — FERROUS SULFATE 325 (65 FE) MG PO TBEC: 325.0000 mg | DELAYED_RELEASE_TABLET | Freq: Every day | ORAL | 3 refills | Status: DC

## 2023-02-03 NOTE — BH Specialist Note (Signed)
Integrated Behavioral Health via Telemedicine Visit  02/03/2023 OPHIE HALDANE [id number]  Number of [place] Clinician visits: 1 Session Start time:  3:00pm Session End time: 3:20pm Total time in minutes: 20 mins   Referring Provider: Bronson Ing NP Patient/Family location: Home [facility] Provider location: [facility] All persons participating in visit: Pt A Dorothyann Peng and LCSW A Javeah Loeza Types of Service: Individual psychotherapy and Telephone visit  I connected with [name] and/or Deneise Lever n/a via  Telephone or Video Enabled Telemedicine Application  (Video is Caregility application) and verified that I am speaking with the correct person using two identifiers. Discussed confidentiality: Yes   I discussed the limitations of telemedicine and the availability of in person appointments.  Discussed there is a possibility of technology failure and discussed alternative modes of communication if that failure occurs.  I discussed that engaging in this telemedicine visit, they consent to the provision of behavioral healthcare and the services will be billed under their insurance.  Patient and/or legal guardian expressed understanding and consented to Telemedicine visit: Yes   Presenting Concerns: Patient and/or family reports the following symptoms/concerns: anxiety Duration of problem: o; Severity of problem: mild  Patient and/or Family's Strengths/Protective Factors: [place] supports in place (healthy food, safe environments, etc.)  Goals Addressed: Patient will:  Reduce symptoms of: anxiety   Increase knowledge and/or ability of: coping skills   Demonstrate ability to: Increase healthy adjustment to current life circumstances  Progress towards Goals: Ongoing  Interventions: Interventions utilized:  Link to [employer] Standardized Assessments completed: Not Needed  Patient and/or Family Response: Ms. Ginger reports increase stress due to  looking for a new apt in [place]. Ms. Mathieu reports spouse and family is supportive.  Assessment: Patient currently experiencing anxiety.   Patient may benefit from integrated behavioral health.  Plan: Follow up with behavioral health clinician on : as needed  Behavioral recommendations: collaborate with local resources,  Referral(s): [facility] (In Clinic)  I discussed the assessment and treatment plan with the patient and/or parent/guardian. They were provided an opportunity to ask questions and all were answered. They agreed with the plan and demonstrated an understanding of the instructions.   They were advised to call back or seek an in-person evaluation if the symptoms worsen or if the condition fails to improve as anticipated.  Lynnea Ferrier, LCSW

## 2023-02-03 NOTE — Telephone Encounter (Signed)
Called [place] [facility] since never heard back if med was approved or denied. Spoke with tamika and was told movantik was approved from 01/28/23 - 01/23/24. I called patient and [facility] pharmacy.

## 2023-02-03 NOTE — Patient Instructions (Signed)
[facility] **   You were seen today by Tarri Abernethy PA-C for your low platelets.    LOW PLATELETS Your low platelets are related to your large spleen (which is caused by your liver cirrhosis). Your labs did not show any other causes of low platelets. At this time, you do not need treatment of your low platelets, but we will continue to monitor your platelets closely.  IRON DEFICIENCY Your iron levels are slightly low. I would like you to start taking over-the-counter iron supplement (ferrous sulfate 325 mg) every other day. We will recheck your iron levels in 3 months  SMOKING CESSATION: Keep up the great work as you cut back on cigarettes!  See the attached handout for additional information on smoking cessation.  WEIGHT LOSS: Losing weight too quickly is dangerous to your health.  Despite low appetite caused by cirrhosis, it is important that you maintain adequate nutrition.  Try to eat small frequent meals throughout the day and include nutritional shakes (such as Glucerna, Boost, Ensure) to supplement your diet.  Please see the attached handout for nutritional advice.  LABS: Return in 3 months for labs  FOLLOW-UP APPOINTMENT: Office visit in 3 months (1 week after labs)  ** Thank you for trusting me with your healthcare!  I strive to provide all of my patients with quality care at each visit.  If you receive a survey for this visit, I would be so grateful to you for taking the time to provide feedback.  Thank you in advance!  ~ Shayne Deerman                   Dr. Derek Jack   &   Tarri Abernethy, PA-C   - - - - - - - - - - - - - - - - - -    Thank you for choosing [place] [facility] at [facility] to provide your oncology and hematology care.  To afford each patient quality time with our provider, please arrive at least 15 minutes before your scheduled appointment time.   If  you have a lab appointment with the [facility] please come in thru the Main Entrance and check in at the main information desk.  You need to re-schedule your appointment should you arrive 10 or more minutes late.  We strive to give you quality time with our providers, and arriving late affects you and other patients whose appointments are after yours.  Also, if you no show three or more times for appointments you may be dismissed from the clinic at the providers discretion.     Again, thank you for choosing [facility].  Our hope is that these requests will decrease the amount of time that you wait before being seen by our physicians.       _____________________________________________________________  Should you have questions after your visit to [facility], please contact our office at [phone] and follow the prompts.  Our office hours are 8:00 a.m. and 4:30 p.m. Monday - Friday.  Please note that voicemails left after 4:00 p.m. may not be returned until the following business day.  We are closed weekends and major holidays.  You do have access to a nurse 24-7, just call the main number to the clinic [phone] and do not press any options, hold on the line and a nurse will answer the phone.  For prescription refill requests, have your pharmacy contact our office and allow 72 hours.

## 2023-02-04 ENCOUNTER — Encounter (HOSPITAL_COMMUNITY): Payer: Self-pay

## 2023-02-04 DIAGNOSIS — I82409 Acute embolism and thrombosis of unspecified deep veins of unspecified lower extremity: Secondary | ICD-10-CM

## 2023-02-04 HISTORY — DX: Acute embolism and thrombosis of unspecified deep veins of unspecified lower extremity: I82.409

## 2023-02-05 ENCOUNTER — Ambulatory Visit (INDEPENDENT_AMBULATORY_CARE_PROVIDER_SITE_OTHER): Payer: Medicaid Other | Admitting: Family Medicine

## 2023-02-05 ENCOUNTER — Encounter: Payer: Self-pay | Admitting: Family Medicine

## 2023-02-05 VITALS — BP 131/77 | HR 123 | Ht 69.0 in | Wt 238.0 lb

## 2023-02-05 DIAGNOSIS — I82432 Acute embolism and thrombosis of left popliteal vein: Secondary | ICD-10-CM

## 2023-02-05 DIAGNOSIS — M79605 Pain in left leg: Secondary | ICD-10-CM | POA: Diagnosis not present

## 2023-02-05 DIAGNOSIS — I82402 Acute embolism and thrombosis of unspecified deep veins of left lower extremity: Secondary | ICD-10-CM | POA: Insufficient documentation

## 2023-02-05 MED ORDER — LIDOCAINE 5 % EX OINT
1.0000 | TOPICAL_OINTMENT | CUTANEOUS | 0 refills | Status: DC | PRN
Start: 2023-02-05 — End: 2023-02-08

## 2023-02-05 NOTE — Patient Instructions (Addendum)
Please Follow up with Oncology/ Hematology for management  If symptoms worsen or any new symptoms of shortness of breath, chest pain please visit the ED

## 2023-02-05 NOTE — Assessment & Plan Note (Addendum)
Patient currently on enoxaparin 100 mg  injection 2 times a day for 14 days as per ED Due to patient history of thrombocytopenia strongly advise patient to follow up with Oncology/ Hematology for management  Discussed If symptoms worsen or any new symptoms of bleeding signs, shortness of breath, chest pain please visit  ED for immediate care

## 2023-02-05 NOTE — Progress Notes (Signed)
Patient Office Visit   Subjective   Patient ID: Jaime [name], female    DOB: 06/04/1986  Age: 37 y.o. MRN: [id number]  CC:  Chief Complaint  Patient presents with   blood clot    [facility] and Dx with blood clot in L leg. Was told to f/u with PCP and Onco.     HPI Jaime [name] 37 year old female, presents to the clinic for ER follow up with dx of left leg DVT.She  has a past medical history of Acute encephalopathy (08/31/2020), Adjustment disorder with depressed mood (09/07/2020), Anxiety, Asthma, Chronic abdominal pain, Chronic back pain, Chronic prescription opiate use (02/16/2022), Cirrhosis, Cirrhosis, COPD (chronic obstructive pulmonary disease), Depression, Diabetes mellitus without complication, Diabetes mellitus, type II, Diverticulosis, Fatty liver (03/07/2020), GERD (gastroesophageal reflux disease), Hepatitis C, Hernia, abdominal, Hyperglycemia due to type 2 diabetes mellitus (05/24/2020), Hyperlipidemia, IBS (irritable bowel syndrome) (02/16/2022), Insomnia, 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 Please refer to plan and assessment for details of today visit.  HPI    Outpatient Encounter Medications as of 02/05/2023  Medication Sig   Accu-Chek Softclix Lancets lancets USE TO TEST BLOOD SUGAR 4 TIMES A DAY.   albuterol (VENTOLIN HFA) 108 (90 Base) MCG/ACT inhaler Inhale 2 puffs into the lungs every 6 (six) hours as needed for wheezing or shortness of breath.   ARIPiprazole (ABILIFY) 20 MG tablet Take 20 mg by mouth daily.   aspirin EC 81 MG tablet Take 81 mg by mouth daily. Swallow whole.   cephALEXin (KEFLEX) 500 MG capsule Take 1 capsule (500 mg total) by mouth 4 (four) times daily.   cetirizine (ZYRTEC ALLERGY) 10 MG tablet Take 1 tablet (10 mg total) by  mouth daily.   clonazePAM (KLONOPIN) 0.5 MG tablet Take 0.5 mg by mouth 2 (two) times daily.   Continuous Blood Gluc Sensor (DEXCOM G6 SENSOR) MISC Change sensor every 10 days.   Continuous Blood Gluc Transmit (DEXCOM G6 TRANSMITTER) MISC USE AS DIRECTED.   cyclobenzaprine (FLEXERIL) 10 MG tablet Take 1 tablet (10 mg total) by mouth 3 (three) times daily as needed for muscle spasms.   dicyclomine (BENTYL) 20 MG tablet Take 1 tablet (20 mg total) by mouth every 6 (six) hours.   DULoxetine (CYMBALTA) 30 MG capsule Take 1 capsule (30 mg total) by mouth daily.   enoxaparin (LOVENOX) 100 MG/ML injection Inject into the skin.   etonogestrel (NEXPLANON) 68 MG IMPL implant 1 each by Subdermal route once.   famotidine (PEPCID) 20 MG tablet Take 1 tablet (20 mg total) by mouth 2 (two) times daily.   ferrous sulfate 325 (65 FE) MG EC tablet Take 1 tablet (325 mg total) by mouth daily with breakfast.   furosemide (LASIX) 40 MG tablet Take 40 mg by mouth daily.   glipiZIDE (GLUCOTROL XL) 5 MG 24 hr tablet Take 1 tablet (5 mg total) by mouth daily with breakfast.   glucose blood (ACCU-CHEK GUIDE) test strip Use as instructed to check blood glucose four times daily   Insulin Pen Needle (GLOBAL EASE INJECT PEN NEEDLES) 31G X 8 MM MISC Use 1 pen needle 4 times daily for insulin.   Insulin Pen Needle (PEN NEEDLES) 32G X 6 MM MISC 1 each by Does not apply route 3 (three) times daily.   insulin regular human CONCENTRATED (HUMULIN R U-500 KWIKPEN)  500 UNIT/ML KwikPen Inject 110 Units into the skin 3 (three) times daily with meals.   lidocaine (XYLOCAINE) 5 % ointment Apply 1 Application topically as needed.   LINZESS 290 MCG CAPS capsule Take 290 mcg by mouth daily.   liraglutide (VICTOZA) 18 MG/3ML SOPN Inject 1.8 mg into the skin daily.   lisinopril (ZESTRIL) 5 MG tablet TAKE 1 TABLET ONCE DAILY.   lubiprostone (AMITIZA) 24 MCG capsule Take 1 capsule (24 mcg total) by mouth 2 (two) times daily with a meal.    metFORMIN (GLUCOPHAGE) 1000 MG tablet Take 1 tablet (1,000 mg total) by mouth 2 (two) times daily with a meal.   methocarbamol (ROBAXIN) 500 MG tablet Take 500 mg by mouth at bedtime.   naloxegol oxalate (MOVANTIK) 25 MG TABS tablet Take 1 tablet (25 mg total) by mouth daily.   nicotine polacrilex (NICORETTE) 4 MG gum Take 1 each (4 mg total) by mouth as needed for smoking cessation.   nitrofurantoin, macrocrystal-monohydrate, (MACROBID) 100 MG capsule Take 1 capsule (100 mg total) by mouth 2 (two) times daily.   nystatin (MYCOSTATIN/NYSTOP) powder Apply 1 application topically 3 (three) times daily.   ondansetron (ZOFRAN-ODT) 4 MG disintegrating tablet Take 1 tablet (4 mg total) by mouth every 8 (eight) hours as needed for nausea or vomiting.   oxyCODONE (ROXICODONE) 5 MG immediate release tablet Take 1 tablet (5 mg total) by mouth every 4 (four) hours as needed for severe pain.   polyethylene glycol-electrolytes (TRILYTE) 420 g solution Take 4,000 mLs by mouth as directed.   potassium chloride (KLOR-CON M) 10 MEQ tablet Take 10 mEq by mouth 2 (two) times daily.   pregabalin (LYRICA) 75 MG capsule Take 75 mg by mouth in the morning, at noon, in the evening, and at bedtime.    promethazine (PHENERGAN) 12.5 MG tablet Take 1 tablet (12.5 mg total) by mouth every 8 (eight) hours as needed for nausea or vomiting.   propranolol (INDERAL) 10 MG tablet Take 10 mg by mouth 3 (three) times daily.   RESTASIS 0.05 % ophthalmic emulsion Place 1 drop into both eyes as needed (dry eye).    rosuvastatin (CRESTOR) 10 MG tablet Take 1 tablet (10 mg total) by mouth daily.   spironolactone (ALDACTONE) 100 MG tablet Take 100 mg by mouth daily.    sucralfate (CARAFATE) 1 GM/10ML suspension Take 10 mLs (1 g total) by mouth 4 (four) times daily -  with meals and at bedtime.   Suvorexant (BELSOMRA) 20 MG TABS Take 20 mg by mouth at bedtime.   tirzepatide (MOUNJARO) 7.5 MG/0.5ML Pen Inject 7.5 mg into the skin once a week.    triamcinolone (KENALOG) 0.1 % Apply 1 application topically daily as needed (irritation).   Vitamin D, Ergocalciferol, (DRISDOL) 1.25 MG (50000 UNIT) CAPS capsule Take 1 capsule (50,000 Units total) by mouth every 7 (seven) days. (Patient taking differently: Take 50,000 Units by mouth every Tuesday.)   pantoprazole (PROTONIX) 40 MG tablet Take 1 tablet (40 mg total) by mouth 2 (two) times daily for 14 days.   No facility-administered encounter medications on file as of 02/05/2023.    Past Surgical History:  Procedure Laterality Date   BIOPSY  11/25/2021   Procedure: BIOPSY;  Surgeon: Dolores Frame, MD;  [facility];  Service: Gastroenterology;;   CHOLECYSTECTOMY     COLONOSCOPY WITH PROPOFOL N/A 11/25/2021   Procedure: COLONOSCOPY WITH PROPOFOL;  Surgeon: Dolores Frame, MD;  [facility];  Service: Gastroenterology;  Laterality: N/A;  805   COLONOSCOPY WITH PROPOFOL N/A 03/31/2022   Procedure: COLONOSCOPY WITH PROPOFOL;  Surgeon: Dolores Frameastaneda Mayorga, Daniel, MD;  [facility];  Service: Gastroenterology;  Laterality: N/A;  730   ESOPHAGOGASTRODUODENOSCOPY  06/2020   done at [facility], candida in upper esophagus (treated with diflucan), ulcerative esophagitis at GE junction, gastritis in stomach, single ulcer in duodenal bulb, with duodenal mucosa showing no abnormality. No presence of varices   ESOPHAGOGASTRODUODENOSCOPY (EGD) WITH PROPOFOL N/A 11/25/2021   Procedure: ESOPHAGOGASTRODUODENOSCOPY (EGD) WITH PROPOFOL;  Surgeon: Dolores Frameastaneda Mayorga, Daniel, MD;  [facility];  Service: Gastroenterology;  Laterality: N/A;   ESOPHAGOGASTRODUODENOSCOPY (EGD) WITH PROPOFOL N/A 03/31/2022   Procedure: ESOPHAGOGASTRODUODENOSCOPY (EGD) WITH PROPOFOL;  Surgeon: Dolores Frameastaneda Mayorga, Daniel, MD;  [facility];  Service: Gastroenterology;  Laterality: N/A;    Review of Systems  Constitutional:  Negative for chills and fever.  Respiratory:   Negative for cough and shortness of breath.   Cardiovascular:  Negative for chest pain, palpitations, orthopnea, claudication, leg swelling and PND.  Gastrointestinal:  Negative for abdominal pain.  Genitourinary:  Negative for dysuria.  Neurological:  Negative for dizziness and headaches.      Objective    BP 131/77   Pulse (!) 123   Ht 5\' 9"  (1.753 m)   Wt 238 lb (108 kg)   SpO2 95%   BMI 35.15 kg/m   Physical Exam HENT:     Head: Normocephalic.  Cardiovascular:     Rate and Rhythm: Normal rate and regular rhythm.     Pulses: Normal pulses.     Heart sounds: Normal heart sounds.  Pulmonary:     Effort: Pulmonary effort is normal.     Breath sounds: Normal breath sounds.  Musculoskeletal:        General: Tenderness present. Normal range of motion.     Right lower leg: No deformity, lacerations or tenderness. No edema.     Left lower leg: Tenderness present. No deformity or lacerations. No edema.     Comments: Left lower leg warm and tender to touch, erythema noted, No bleeding or bruising noted.  Skin:    General: Skin is warm and dry.     Capillary Refill: Capillary refill takes less than 2 seconds.  Neurological:     General: No focal deficit present.     Mental Status: She is alert.     Coordination: Coordination normal.     Gait: Gait normal.  Psychiatric:        Mood and Affect: Mood normal.       Assessment & Plan:  Left leg pain -     Lidocaine; Apply 1 Application topically as needed.  Dispense: 35.44 g; Refill: 0  Acute deep vein thrombosis (DVT) of popliteal vein of left lower extremity Assessment & Plan: Patient currently on enoxaparin 100 mg  injection 2 times a day for 14 days as per ED Due to patient history of thrombocytopenia strongly advise patient to follow up with Oncology/ Hematology for management  Discussed If symptoms worsen or any new symptoms of bleeding signs, shortness of breath, chest pain please visit  ED for immediate  care     No follow-ups on file.   Cruzita LedererIliana Del Newman Niprbe Polanco, FNP

## 2023-02-05 NOTE — Assessment & Plan Note (Deleted)
Patient currently on enoxaparin 100 mg  injection 2 times a day for 14 days as per ED Due to patient history of thrombocytopenia strongly advise patient to follow up with Oncology/ Hematology for management  Discussed If symptoms worsen or any new symptoms of bleeding signs, shortness of breath, chest pain please visit the ED for immediate care

## 2023-02-07 ENCOUNTER — Other Ambulatory Visit: Payer: Self-pay | Admitting: Family Medicine

## 2023-02-07 DIAGNOSIS — M79605 Pain in left leg: Secondary | ICD-10-CM

## 2023-02-08 ENCOUNTER — Other Ambulatory Visit: Payer: Self-pay | Admitting: Family Medicine

## 2023-02-08 ENCOUNTER — Other Ambulatory Visit: Payer: Self-pay

## 2023-02-08 DIAGNOSIS — M79605 Pain in left leg: Secondary | ICD-10-CM

## 2023-02-08 MED ORDER — LIDOCAINE 5 % EX OINT
1.0000 | TOPICAL_OINTMENT | CUTANEOUS | 0 refills | Status: DC | PRN
Start: 2023-02-08 — End: 2023-02-11

## 2023-02-08 NOTE — Telephone Encounter (Signed)
Please refill.

## 2023-02-09 ENCOUNTER — Ambulatory Visit: Payer: Medicaid Other | Admitting: Family Medicine

## 2023-02-09 ENCOUNTER — Ambulatory Visit (HOSPITAL_COMMUNITY): Payer: Medicaid Other | Attending: Family Medicine

## 2023-02-09 ENCOUNTER — Other Ambulatory Visit: Payer: Self-pay

## 2023-02-09 DIAGNOSIS — M5459 Other low back pain: Secondary | ICD-10-CM | POA: Diagnosis not present

## 2023-02-09 DIAGNOSIS — M545 Low back pain, unspecified: Secondary | ICD-10-CM | POA: Insufficient documentation

## 2023-02-09 DIAGNOSIS — G8929 Other chronic pain: Secondary | ICD-10-CM | POA: Insufficient documentation
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
## 2023-02-10 ENCOUNTER — Encounter: Payer: Self-pay | Admitting: Orthopaedic Surgery

## 2023-02-10 ENCOUNTER — Ambulatory Visit: Payer: Medicaid Other | Admitting: Orthopaedic Surgery

## 2023-02-10 VITALS — Ht 69.0 in | Wt 236.0 lb

## 2023-02-10 DIAGNOSIS — S32000A Wedge compression fracture of unspecified lumbar vertebra, initial encounter for closed fracture: Secondary | ICD-10-CM | POA: Insufficient documentation

## 2023-02-10 DIAGNOSIS — S32050A Wedge compression fracture of fifth lumbar vertebra, initial encounter for closed fracture: Secondary | ICD-10-CM | POA: Diagnosis not present

## 2023-02-10 NOTE — Progress Notes (Signed)
[facility] [address], [place] [address]   Clinic Day:  02/10/2023  Referring physician: Wylene Men*  Patient Care Team: Del Nigel Berthold, FNP as PCP - General (Family Medicine)   ASSESSMENT & PLAN:   Assessment: 1.  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  Had dental extractions 1 and half year ago without any excessive bleeding. - CTAP (01/07/2023): Splenomegaly with length measuring 26.1 cm and splenorenal varices.  Cirrhotic changes in the liver. - Jaime [name] follows with Dr. Levon Hedger for cirrhosis and other GI complaints.  Jaime [name] is not yet on liver transplant list. - Hematology workup (01/14/2023): Negative rheumatoid factor, ANA, lupus anticoagulant. Normal B12, folate, copper, MMA. Immature platelet fraction 7.3%.  Reticulocytes 1.6%.  LDH normal. SPEP normal.  Mildly elevated kappa free light chain 43.6, elevated lambda 34.1.  Normal FLC ratio 1.28. - Most recent CBC/D (01/14/2023): Platelets 57, WBC 2.9/ANC 1.6.  Normal hemoglobin.  2.  Iron deficiency without anemia - Labs from 01/14/2023 show normal Hgb 12.5/MCV 91.2, but with some mild iron deficiency (ferritin 66, iron saturation 11%) - Reports prior history of rectal bleeding, but no active bleeding.  LMP in 2018 (currently has implant) - Colonoscopy (03/31/2022): Preparation of the colon was poor.  Hemorrhoids found on perianal exam. 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  No gastric varices.  5 nonbleeding cratered gastric ulcers with a clear ulcer base in the gastric antrum.   2.  Social/family history: - Jaime [name] is currently on disability.  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  No history of  alcohol use.  Current active smoker half pack to 1 pack of cigarettes per day for 5 years. - No family history of low platelets.  Father had melanoma.  No family history of leukemia.   Plan: 1.  Moderate to severe thrombocytopenia: - We talked about the differential diagnosis of low platelet count including splenic sequestration, liver disease leading to decreased production of platelets, nutritional deficiencies, immune mediated thrombocytopenia from destruction. - We will check for nutritional deficiencies, connective tissue disorders and bone marrow infiltrative process. - Jaime [name] will come back in 2 weeks for follow-up. - If Jaime [name] is on liver transplant list, will consider bone marrow biopsy to rule out any underlying process. - If her platelet count continues to get worse, will consider splenic embolization for improving cytopenias from hypersplenism. - If Jaime [name] needs preprocedure increase of platelet count, will use avatrombopag.  2.  Iron deficiency without anemia - Recommend iron repletion with ferrous sulfate 325 mg EOD - We will recheck CBC/D, iron/TIBC, and ferritin in 3 months   No orders of the defined types were placed in this encounter.     I,Katie Daubenspeck,acting as a Neurosurgeon for Doreatha Massed, MD.,have documented all relevant documentation on the behalf of Doreatha Massed, MD,as directed by  Doreatha Massed, MD while in the presence of Doreatha Massed, MD.   ***  Katie Daubenspeck   4/10/20246:52 PM  CHIEF COMPLAINT/PURPOSE OF CONSULT:   Diagnosis: DVT and thrombocytopenia  Current Therapy:  ***  HISTORY OF PRESENT ILLNESS:   Jaime [name] is a 37 y.o. female presenting to clinic today for evaluation of acute DVT. Jaime [name] was last  seen by PA Rebekah for thrombocytopenia on 02/03/23.  Since her last visit, Jaime [name] underwent bilateral lower extremity doppler on 02/04/23 as follow up from her previous admission. This revealed DVT within left popliteal vein. This  prompted referral to the ED for management. Jaime [name] was given lovenox in the ED and discharged with a prescription.  Today, Jaime [name] states that Jaime [name] is doing well overall. Her appetite level is at ***%. Her energy level is at ***%.  PAST MEDICAL HISTORY:   Past Medical History: Past Medical History:  Diagnosis Date   Acute encephalopathy 08/31/2020   Adjustment disorder with depressed mood 09/07/2020   Anxiety    Asthma    Chronic abdominal pain    Chronic back pain    Chronic prescription opiate use 02/16/2022   Cirrhosis    Cirrhosis    COPD (chronic obstructive pulmonary disease)    Depression    Diabetes mellitus without complication    Diabetes mellitus, type II    Diverticulosis    Fatty liver 03/07/2020   GERD (gastroesophageal reflux disease)    Hepatitis C    Hernia, abdominal    Hyperglycemia due to type 2 diabetes mellitus 05/24/2020   Hyperlipidemia    IBS (irritable bowel syndrome) 02/16/2022   Insomnia    Long-term current use of methadone for opiate dependence    Lupus    Migraine headache    Morbid obesity 05/24/2020   Neuropathy    Nocturnal seizures    Pain of upper abdomen 07/28/2021   Peptic ulcer    Spleen enlarged    Splenomegaly     Surgical History: Past Surgical History:  Procedure Laterality Date   BIOPSY  11/25/2021   Procedure: BIOPSY;  Surgeon: Dolores Frame, MD;  [facility];  Service: Gastroenterology;;   CHOLECYSTECTOMY     COLONOSCOPY WITH PROPOFOL N/A 11/25/2021   Procedure: COLONOSCOPY WITH PROPOFOL;  Surgeon: Dolores Frame, MD;  Location: [facility];  Service: Gastroenterology;  Laterality: N/A;  805   COLONOSCOPY WITH PROPOFOL N/A 03/31/2022   Procedure: COLONOSCOPY WITH PROPOFOL;  Surgeon: Dolores Frame, MD;  Location: [facility];  Service: Gastroenterology;  Laterality: N/A;  730   ESOPHAGOGASTRODUODENOSCOPY  06/2020   done at [facility], candida in upper esophagus (treated with  diflucan), ulcerative esophagitis at GE junction, gastritis in stomach, single ulcer in duodenal bulb, with duodenal mucosa showing no abnormality. No presence of varices   ESOPHAGOGASTRODUODENOSCOPY (EGD) WITH PROPOFOL N/A 11/25/2021   Procedure: ESOPHAGOGASTRODUODENOSCOPY (EGD) WITH PROPOFOL;  Surgeon: Dolores Frame, MD;  [facility];  Service: Gastroenterology;  Laterality: N/A;   ESOPHAGOGASTRODUODENOSCOPY (EGD) WITH PROPOFOL N/A 03/31/2022   Procedure: ESOPHAGOGASTRODUODENOSCOPY (EGD) WITH PROPOFOL;  Surgeon: Dolores Frame, MD;  [facility]: [facility];  Service: Gastroenterology;  Laterality: N/A;    Social History: Social History   Socioeconomic History   Marital status: Single    Spouse name: Not on file   Number of children: Not on file   Years of education: Not on file   Highest education level: Not on file  Occupational History   Not on file  Tobacco Use   Smoking status: Every Day    Packs/day: .5    Types: Cigarettes   Smokeless tobacco: Never  Vaping Use   Vaping Use: Former   Substances: Nicotine, Flavoring  Substance and Sexual Activity   Alcohol use: No   Drug use: No   Sexual activity: Not Currently    Birth  control/protection: Implant  Other Topics Concern   Not on file  Social History Narrative   Not on file   Social Determinants of Health   Financial Resource Strain: Medium Risk (05/09/2020)   Overall Financial Resource Strain (CARDIA)    Difficulty of Paying Living Expenses: Somewhat hard  Food Insecurity: Food Insecurity Present (01/14/2023)   Hunger Vital Sign    Worried About Running Out of Food in the Last Year: Sometimes true    Ran Out of Food in the Last Year: Never true  Transportation Needs: No Transportation Needs (01/14/2023)   PRAPARE - Administrator, Civil Service (Medical): No    Lack of Transportation (Non-Medical): No  Physical Activity: Insufficiently Active (05/09/2020)   Exercise Vital  Sign    Days of Exercise per Week: 1 day    Minutes of Exercise per Session: 20 min  Stress: Stress Concern Present (05/09/2020)   [employer] of Occupational Health - Occupational Stress Questionnaire    Feeling of Stress : Very much  Social Connections: Moderately Isolated (05/09/2020)   Social Connection and Isolation Panel [NHANES]    Frequency of Communication with Friends and Family: More than three times a week    Frequency of Social Gatherings with Friends and Family: Twice a week    Attends Religious Services: More than 4 times per year    Active Member of [employer] or Organizations: No    Attends Banker Meetings: Never    Marital Status: Never married  Intimate Partner Violence: Not At Risk (01/14/2023)   Humiliation, Afraid, Rape, and Kick questionnaire    Fear of Current or Ex-Partner: No    Emotionally Abused: No    Physically Abused: No    Sexually Abused: No    Family History: Family History  Problem Relation Age of Onset   Hyperlipidemia Maternal Grandfather     Current Medications:  Current Outpatient Medications:    Accu-Chek Softclix Lancets lancets, USE TO TEST BLOOD SUGAR 4 TIMES A DAY., Disp: 150 each, Rfl: 3   albuterol (VENTOLIN HFA) 108 (90 Base) MCG/ACT inhaler, Inhale 2 puffs into the lungs every 6 (six) hours as needed for wheezing or shortness of breath., Disp: 8 g, Rfl: 0   ARIPiprazole (ABILIFY) 20 MG tablet, Take 20 mg by mouth daily., Disp: , Rfl:    aspirin EC 81 MG tablet, Take 81 mg by mouth daily. Swallow whole., Disp: , Rfl:    cephALEXin (KEFLEX) 500 MG capsule, Take 1 capsule (500 mg total) by mouth 4 (four) times daily., Disp: 28 capsule, Rfl: 0   cetirizine (ZYRTEC ALLERGY) 10 MG tablet, Take 1 tablet (10 mg total) by mouth daily., Disp: 30 tablet, Rfl: 0   clonazePAM (KLONOPIN) 0.5 MG tablet, Take 0.5 mg by mouth 2 (two) times daily., Disp: , Rfl:    Continuous Blood Gluc Sensor (DEXCOM G6 SENSOR) MISC, Change sensor every 10  days., Disp: 6 each, Rfl: 3   Continuous Blood Gluc Transmit (DEXCOM G6 TRANSMITTER) MISC, USE AS DIRECTED., Disp: 1 each, Rfl: 1   cyclobenzaprine (FLEXERIL) 10 MG tablet, Take 1 tablet (10 mg total) by mouth 3 (three) times daily as needed for muscle spasms., Disp: 30 tablet, Rfl: 0   dicyclomine (BENTYL) 20 MG tablet, Take 1 tablet (20 mg total) by mouth every 6 (six) hours., Disp: 30 tablet, Rfl: 1   DULoxetine (CYMBALTA) 30 MG capsule, Take 1 capsule (30 mg total) by mouth daily., Disp: 90 capsule, Rfl: 0  enoxaparin (LOVENOX) 100 MG/ML injection, Inject into the skin., Disp: , Rfl:    etonogestrel (NEXPLANON) 68 MG IMPL implant, 1 each by Subdermal route once., Disp: , Rfl:    famotidine (PEPCID) 20 MG tablet, Take 1 tablet (20 mg total) by mouth 2 (two) times daily., Disp: 30 tablet, Rfl: 0   ferrous sulfate 325 (65 FE) MG EC tablet, Take 1 tablet (325 mg total) by mouth daily with breakfast., Disp: 90 tablet, Rfl: 3   furosemide (LASIX) 40 MG tablet, Take 40 mg by mouth daily., Disp: , Rfl:    glipiZIDE (GLUCOTROL XL) 5 MG 24 hr tablet, Take 1 tablet (5 mg total) by mouth daily with breakfast., Disp: 90 tablet, Rfl: 1   glucose blood (ACCU-CHEK GUIDE) test strip, Use as instructed to check blood glucose four times daily, Disp: 150 each, Rfl: 5   Insulin Pen Needle (GLOBAL EASE INJECT PEN NEEDLES) 31G X 8 MM MISC, Use 1 pen needle 4 times daily for insulin., Disp: 400 each, Rfl: 0   Insulin Pen Needle (PEN NEEDLES) 32G X 6 MM MISC, 1 each by Does not apply route 3 (three) times daily., Disp: 100 each, Rfl: 3   insulin regular human CONCENTRATED (HUMULIN R U-500 KWIKPEN) 500 UNIT/ML KwikPen, Inject 110 Units into the skin 3 (three) times daily with meals., Disp: 60 mL, Rfl: 3   lidocaine (XYLOCAINE) 5 % ointment, Apply 1 Application topically as needed., Disp: 35.44 g, Rfl: 0   LINZESS 290 MCG CAPS capsule, Take 290 mcg by mouth daily., Disp: , Rfl:    liraglutide (VICTOZA) 18 MG/3ML SOPN,  Inject 1.8 mg into the skin daily., Disp: , Rfl:    lisinopril (ZESTRIL) 5 MG tablet, TAKE 1 TABLET ONCE DAILY., Disp: 30 tablet, Rfl: 0   lubiprostone (AMITIZA) 24 MCG capsule, Take 1 capsule (24 mcg total) by mouth 2 (two) times daily with a meal., Disp: 180 capsule, Rfl: 3   metFORMIN (GLUCOPHAGE) 1000 MG tablet, Take 1 tablet (1,000 mg total) by mouth 2 (two) times daily with a meal., Disp: 180 tablet, Rfl: 3   methocarbamol (ROBAXIN) 500 MG tablet, Take 500 mg by mouth at bedtime., Disp: , Rfl:    naloxegol oxalate (MOVANTIK) 25 MG TABS tablet, Take 1 tablet (25 mg total) by mouth daily., Disp: 90 tablet, Rfl: 3   nicotine polacrilex (NICORETTE) 4 MG gum, Take 1 each (4 mg total) by mouth as needed for smoking cessation., Disp: 100 tablet, Rfl: 1   nitrofurantoin, macrocrystal-monohydrate, (MACROBID) 100 MG capsule, Take 1 capsule (100 mg total) by mouth 2 (two) times daily., Disp: 10 capsule, Rfl: 0   nystatin (MYCOSTATIN/NYSTOP) powder, Apply 1 application topically 3 (three) times daily., Disp: 15 g, Rfl: 0   ondansetron (ZOFRAN-ODT) 4 MG disintegrating tablet, Take 1 tablet (4 mg total) by mouth every 8 (eight) hours as needed for nausea or vomiting., Disp: 12 tablet, Rfl: 0   oxyCODONE (ROXICODONE) 5 MG immediate release tablet, Take 1 tablet (5 mg total) by mouth every 4 (four) hours as needed for severe pain., Disp: 12 tablet, Rfl: 0   pantoprazole (PROTONIX) 40 MG tablet, Take 1 tablet (40 mg total) by mouth 2 (two) times daily for 14 days., Disp: 28 tablet, Rfl: 0   polyethylene glycol-electrolytes (TRILYTE) 420 g solution, Take 4,000 mLs by mouth as directed., Disp: 4000 mL, Rfl: 0   potassium chloride (KLOR-CON M) 10 MEQ tablet, Take 10 mEq by mouth 2 (two) times daily., Disp: , Rfl:  pregabalin (LYRICA) 75 MG capsule, Take 75 mg by mouth in the morning, at noon, in the evening, and at bedtime. , Disp: , Rfl:    promethazine (PHENERGAN) 12.5 MG tablet, Take 1 tablet (12.5 mg total)  by mouth every 8 (eight) hours as needed for nausea or vomiting., Disp: 30 tablet, Rfl: 2   propranolol (INDERAL) 10 MG tablet, Take 10 mg by mouth 3 (three) times daily., Disp: , Rfl:    RESTASIS 0.05 % ophthalmic emulsion, Place 1 drop into both eyes as needed (dry eye). , Disp: , Rfl:    rosuvastatin (CRESTOR) 10 MG tablet, Take 1 tablet (10 mg total) by mouth daily., Disp: 90 tablet, Rfl: 3   spironolactone (ALDACTONE) 100 MG tablet, Take 100 mg by mouth daily. , Disp: , Rfl:    sucralfate (CARAFATE) 1 GM/10ML suspension, Take 10 mLs (1 g total) by mouth 4 (four) times daily -  with meals and at bedtime., Disp: 420 mL, Rfl: 0   Suvorexant (BELSOMRA) 20 MG TABS, Take 20 mg by mouth at bedtime., Disp: , Rfl:    tirzepatide (MOUNJARO) 7.5 MG/0.5ML Pen, Inject 7.5 mg into the skin once a week., Disp: 6 mL, Rfl: 3   triamcinolone (KENALOG) 0.1 %, Apply 1 application topically daily as needed (irritation)., Disp: , Rfl:    Vitamin D, Ergocalciferol, (DRISDOL) 1.25 MG (50000 UNIT) CAPS capsule, Take 1 capsule (50,000 Units total) by mouth every 7 (seven) days. (Patient taking differently: Take 50,000 Units by mouth every Tuesday.), Disp: 12 capsule, Rfl: 0   Allergies: Allergies  Allergen Reactions   Iodine-131 Anaphylaxis, Shortness Of Breath and Swelling   Ivp Dye [Iodinated Contrast Media] Anaphylaxis    Skin gets very red, unable to walk    Ketorolac Tromethamine Shortness Of Breath   Tylenol [Acetaminophen] Other (See Comments)    Due to cirrhosis    Vancomycin Swelling    Facial swelling,    Gabapentin Itching   Nsaids Other (See Comments)    Flares ulcers   Suboxone [Buprenorphine Hcl-Naloxone Hcl]     Withdrawal symptoms     REVIEW OF SYSTEMS:   Review of Systems  Constitutional:  Negative for chills, fatigue and fever.  HENT:   Negative for lump/mass, mouth sores, nosebleeds, sore throat and trouble swallowing.   Eyes:  Negative for eye problems.  Respiratory:  Negative for  cough and shortness of breath.   Cardiovascular:  Negative for chest pain, leg swelling and palpitations.  Gastrointestinal:  Negative for abdominal pain, constipation, diarrhea, nausea and vomiting.  Genitourinary:  Negative for bladder incontinence, difficulty urinating, dysuria, frequency, hematuria and nocturia.   Musculoskeletal:  Negative for arthralgias, back pain, flank pain, myalgias and neck pain.  Skin:  Negative for itching and rash.  Neurological:  Negative for dizziness, headaches and numbness.  Hematological:  Does not bruise/bleed easily.  Psychiatric/Behavioral:  Negative for depression, sleep disturbance and suicidal ideas. The patient is not nervous/anxious.   All other systems reviewed and are negative.    VITALS:   There were no vitals taken for this visit.  Wt Readings from Last 3 Encounters:  02/10/23 236 lb (107 kg)  02/05/23 238 lb (108 kg)  02/03/23 239 lb 1.6 oz (108.5 kg)    There is no height or weight on file to calculate BMI.   PHYSICAL EXAM:   Physical Exam Vitals and nursing note reviewed. Exam conducted with a chaperone present.  Constitutional:      Appearance: Normal appearance.  Cardiovascular:     Rate and Rhythm: Normal rate and regular rhythm.     Pulses: Normal pulses.     Heart sounds: Normal heart sounds.  Pulmonary:     Effort: Pulmonary effort is normal.     Breath sounds: Normal breath sounds.  Abdominal:     Palpations: Abdomen is soft. There is no hepatomegaly, splenomegaly or mass.     Tenderness: There is no abdominal tenderness.  Musculoskeletal:     Right lower leg: No edema.     Left lower leg: No edema.  Lymphadenopathy:     Cervical: No cervical adenopathy.     Right cervical: No superficial, deep or posterior cervical adenopathy.    Left cervical: No superficial, deep or posterior cervical adenopathy.     Upper Body:     Right upper body: No supraclavicular or axillary adenopathy.     Left upper body: No  supraclavicular or axillary adenopathy.  Neurological:     General: No focal deficit present.     Mental Status: Jaime [name] is alert and oriented to person, place, and time.  Psychiatric:        Mood and Affect: Mood normal.        Behavior: Behavior normal.     LABS:      Latest Ref Rng & Units 01/16/2023    6:21 AM 01/14/2023    9:03 AM 01/07/2023    6:30 AM  CBC  WBC 4.0 - 10.5 K/uL 2.4  2.9  2.9   Hemoglobin 12.0 - 15.0 g/dL 16.1  09.6  04.5   Hematocrit 36.0 - 46.0 % 37.0  38.3  38.3   Platelets 150 - 400 K/uL 50  57  52       Latest Ref Rng & Units 01/16/2023    6:21 AM 01/07/2023    6:30 AM 01/04/2023    8:33 PM  CMP  Glucose 70 - 99 mg/dL 61  86  409   BUN 6 - 20 mg/dL 6  7  7    Creatinine 0.44 - 1.00 mg/dL 8.11  9.14  7.82   Sodium 135 - 145 mmol/L 142  139  136   Potassium 3.5 - 5.1 mmol/L 3.8  3.8  4.2   Chloride 98 - 111 mmol/L 111  109  110   CO2 22 - 32 mmol/L 23  23  20    Calcium 8.9 - 10.3 mg/dL 8.3  8.1  7.7   Total Protein 6.5 - 8.1 g/dL 5.9  5.8  6.4   Total Bilirubin 0.3 - 1.2 mg/dL 0.8  0.8  1.2   Alkaline Phos 38 - 126 U/L 119  113  121   AST 15 - 41 U/L 40  39  45   ALT 0 - 44 U/L 24  26  30       No results found for: "CEA1", "CEA" / No results found for: "CEA1", "CEA" No results found for: "PSA1" No results found for: "CAN199" No results found for: "CAN125"  Lab Results  Component Value Date   TOTALPROTELP 6.3 01/14/2023   ALBUMINELP 3.1 01/14/2023   A1GS 0.3 01/14/2023   A2GS 0.5 01/14/2023   BETS 1.2 01/14/2023   GAMS 1.2 01/14/2023   MSPIKE Not Observed 01/14/2023   SPEI Comment 01/14/2023   Lab Results  Component Value Date   TIBC 309 01/14/2023   FERRITIN 66 01/14/2023   FERRITIN 63 08/12/2020   IRONPCTSAT 11 01/14/2023   Lab Results  Component Value  Date   LDH 145 01/14/2023   LDH 209 (H) 08/12/2020     STUDIES:   DG Lumbar Spine 2-3 Views  Result Date: 01/27/2023 CLINICAL DATA:  Back pain EXAM: LUMBAR SPINE - 2-3 VIEW  COMPARISON:  CT 01/07/2023 FINDINGS: Lumbar alignment within normal limits. Vertebral body heights are maintained. Mild disc space narrowing and degenerative change L1-L2, L2-L3 and L4-L5. IMPRESSION: Mild degenerative changes. Electronically Signed   By: Jasmine PangKim  Fujinaga M.D.   On: 01/27/2023 23:57   DG Chest 2 View  Result Date: 01/16/2023 CLINICAL DATA:  Chest pain.  Nausea, loss of appetite and chills. EXAM: CHEST - 2 VIEW COMPARISON:  01/04/2023 FINDINGS: Heart size is normal. No pleural effusion or signs of interstitial edema. Decreased lung volumes with atelectasis in the right base. No airspace consolidation. Visualized osseous structures appear intact. IMPRESSION: Low lung volumes with right base atelectasis. Electronically Signed   By: Signa Kellaylor  Stroud M.D.   On: 01/16/2023 05:56

## 2023-02-10 NOTE — Progress Notes (Signed)
Office Visit Note   Patient: Jaime [name]           Date of Birth: Dec 20, 1985           MRN: [id number] Visit Date: 02/10/2023              Requested by: Rica Records, FNP [phone] [address] [place],  [place] [address] PCP: Rica Records, FNP   Assessment & Plan: Visit Diagnoses:  1. Compression fracture of L5 vertebra, initial encounter     Plan: Discussed patient has been she likely had an injury either from lifting something heavy or slip and fall that she does not recall.  We reviewed CT scans she understands the injury.  Discussed with her that her symptoms should resolve with time she can continue walking.  With therapy she will avoid heavy lifting military presses squats and forward flexion exercises.  She can do walking ride an exercise bike gently.  Return in 6 weeks [facility] lateral lumbar x-ray on return including spot L5-S1 image lateral.  Discussed with patient if she is now slowly weaned off narcotic medication is best to stay off of narcotics.  She has not lost any height no vertebral body at L5 and she did have some pain and aching that should resolve over a number of weeks.  Recheck 6 weeks.  Follow-Up Instructions: Return in about 6 weeks (around 03/24/2023).   Orders:  No orders of the defined types were placed in this encounter.  No orders of the defined types were placed in this encounter.     Procedures: No procedures performed   Clinical Data: No additional findings.   Subjective: Chief Complaint  Patient presents with   Lower Back - Pain    HPI  37 year old female here with her husband they just got married on Valentine's Day this year and she is here concerning back pain and left leg pain.  She was just diagnosed with popliteal vein DVT left lower extremity which is the leg with bothering her.  She has been referred to pain specialist by PCP.  She had been on chronic narcotic medication on methadone gradually weaned down to 1 mg  and her husband states she has been off the medication.  Patient is disabled, not working.  Physical therapy was ordered she just had her first assessment yesterday.  She has back pain that radiates down to her feet worse on the left than right she has chronic numbness with neuropathy related to her diabetes.  Patient currently on Lovenox twice daily injections x 14 days for her acute DVT.  Review of Systems extensive history of problems with encephalopathy chronic pain.  Drug screen positive for THC, opioids, depression history of hepatitis C.  Previously on methadone she states she has been off for several weeks.  She denies falls and her husband is with her states she has not fallen as far as he knows since November.  She had an abdominal CT scan 08/09/2022 which showed no fracture and no other CT abdomen pelvis with contrast 01/07/2023 for abdominal pain which showed superior anterior endplate fracture at L5 which would normally be a fall on your buttocks with flexion.   Objective: Vital Signs: Ht 5\' 9"  (1.753 m)   Wt 236 lb (107 kg)   BMI 34.85 kg/m   Physical Exam Constitutional:      Appearance: She is well-developed.  HENT:     Head: Normocephalic.  Right Ear: External ear normal.     Left Ear: External ear normal. There is no impacted cerumen.  Eyes:     Pupils: Pupils are equal, round, and reactive to light.  Neck:     Thyroid: No thyromegaly.     Trachea: No tracheal deviation.  Cardiovascular:     Rate and Rhythm: Normal rate.  Pulmonary:     Effort: Pulmonary effort is normal.  Abdominal:     Palpations: Abdomen is soft.  Musculoskeletal:     Cervical back: No rigidity.  Skin:    General: Skin is warm and dry.  Neurological:     Mental Status: She is alert and oriented to person, place, and time.  Psychiatric:        Behavior: Behavior normal.     Ortho Exam patient can heel and toe walk.  Anterior tib gastrocsoleus is intact some stocking decreased sensation  right and left feet.  Mild swelling left lower extremity.  Knee and ankle jerk is intact.  Specialty Comments:  No specialty comments available.  Imaging: Narrative & Impression  CLINICAL DATA:  Epigastric pain.   EXAM: CT ABDOMEN AND PELVIS WITHOUT CONTRAST   TECHNIQUE: Multidetector CT imaging of the abdomen and pelvis was performed following the standard protocol without IV contrast.   RADIATION DOSE REDUCTION: This exam was performed according to the departmental dose-optimization program which includes automated exposure control, adjustment of the mA and/or kV according to patient size and/or use of iterative reconstruction technique.   COMPARISON:  CT without contrast 08/09/2022 and 11/08/2021   FINDINGS: Lower chest: There is increased distal esophageal thickening. Endoscopic follow-up suggested. There is increased subsegmental atelectasis in the lung bases without infiltrates.   Hepatobiliary: The liver is mildly prominent, with again noted cirrhotic changes. The liver is less steatotic than previously. No mass is seen without contrast. Gallbladder is absent with no biliary dilatation.   Pancreas: Diffuse fatty replacement. No other focal abnormality. There is increased edema around the pancreas but there is increased edema elsewhere in the abdomen and pelvis as well. Correlate clinically for underlying pancreatitis. This could be part of the generalized mesenteric congestive changes or due to interval new pancreatitis.   Spleen: Splenomegaly with splenic length 26.1 cm and splenorenal varices. No mass is seen without contrast.   Adrenals/Urinary Tract: No focal abnormality of the unenhanced adrenal glands and kidneys. No urinary stones or obstruction. The bladder is unremarkable.   Stomach/Bowel: There is diffuse thickening of the stomach which could be from gastritis or portal gastropathy. The unopacified small bowel is normal caliber. There is no large  bowel thickening. There is diverticulosis without evidence of diverticulitis. The appendix is normal.   Vascular/Lymphatic: Mild aortic atherosclerosis. No AAA. The hepatic portal vein is prominent measuring 17 mm with splenic vein engorgement, splenorenal varices and a recanalized umbilical vein consistent with portal hypertension. This was seen previously. No adenopathy is seen.   Reproductive: Uterus and bilateral adnexa are unremarkable.   Other: There is increased edema in the abdominal wall pannus subcutaneously which could be congestive or due to cellulitis.   Elsewhere there is somewhat increased body wall anasarca especially in the flanks.   There are increased mesenteric congestive features and there is increased mild-to-moderate abdominopelvic free ascites. The largest pockets are in the anterior right and left lower abdomen below the level of the cecum and descending colon.   There is no free air or abscess. There are small umbilical and inguinal fat hernias but  no incarcerated hernias.   Musculoskeletal: There is osteopenia with interval new demonstration of a mild upper plate anterior wedge compression fracture of the L5 vertebral body. There is no retropulsion. Anterior height loss is about 10% with no posterior height loss.   Age is indeterminate but this was not present on 08/09/2022. There are degenerative changes in the thoracic and lumbar spine with mild chronic wedge compression fractures T8, T10, T11 and T12.   IMPRESSION: 1. Increased distal esophageal thickening. Endoscopic follow-up suggested. 2. Increased mesenteric edema which could be due to generalized mesenteric congestive changes. 3. Some of the edema is around the pancreas. Correlate clinically for underlying pancreatitis. 4. Cirrhotic liver with splenomegaly, splenorenal varices, prominence of the portal main vein and recanalized umbilical vein. 5. Increased mild-to-moderate free  ascites. 6. Increased edema in the abdominal wall pannus subcutaneously which could be congestive or due to cellulitis. 7. Increased thickening of the stomach which could be due to gastritis or portal gastropathy. 8. Osteopenia with interval new mild upper plate anterior wedge compression fracture of the L5 vertebral body. Age is indeterminate but this was not present on 08/09/2022. 9. Diverticulosis without evidence of diverticulitis. 10. Aortic atherosclerosis.   Aortic Atherosclerosis (ICD10-I70.0).     Electronically Signed   By: Almira Bar M.D.   On: 01/07/2023 07:54     PMFS History: Patient Active Problem List   Diagnosis Date Noted   Lumbar compression fracture 02/10/2023   Left leg DVT 02/05/2023   At risk for arrhythmia 01/26/2023   Chronic back pain 01/01/2023   Insomnia 01/01/2023   Depression, recurrent 01/01/2023   Constipation 05/18/2022   IBS (irritable bowel syndrome) 02/16/2022   Chronic prescription opiate use 02/16/2022   Abdominal pain 11/17/2021   Gastroesophageal reflux disease 10/23/2021   Nausea and vomiting 10/23/2021   Diarrhea 10/23/2021   Rectal bleeding 07/28/2021   Melena 07/28/2021   Pain of upper abdomen 07/28/2021   Non-intractable vomiting 07/28/2021   Adjustment disorder with depressed mood 09/07/2020   Respiratory failure 08/31/2020   Acute encephalopathy 08/31/2020   Hyponatremia 08/16/2020   Volume overload 08/14/2020   Dyspnea 07/29/2020   Anasarca 05/24/2020   Hyperglycemia due to type 2 diabetes mellitus 05/24/2020   Morbid obesity 05/24/2020   Nexplanon insertion 05/20/2020   Nexplanon removal 05/20/2020   Elevated serum immunoglobulin free light chains 03/08/2020   Fatty liver 03/07/2020   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   Splenomegaly 03/07/2020   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   Pressure injury of skin 01/29/2019   Blunt injury of chest 10/27/2018   Uncontrolled diabetes mellitus 09/19/2018    Hyperglycemia 09/17/2018   Acute respiratory failure with hypoxia 09/15/2018   Anxiety 09/15/2018   Chronic abdominal pain 09/15/2018   Diabetes mellitus without complication 09/15/2018   Hepatitis C 09/15/2018   Peptic ulcer 09/15/2018   Long-term current use of methadone for opiate dependence 09/15/2018   Cirrhosis 09/15/2018   Hematochezia 09/08/2018   Leukopenia 07/05/2018   Tobacco use disorder 05/22/2018   Acute hypoxemic respiratory failure 07/17/2016   Pleural effusion on right 07/17/2016   Multiple rib fractures involving four or more ribs 07/17/2016   Chronic pain disorder 07/17/2016   Depression 05/24/2013   Past Medical History:  Diagnosis Date   Acute encephalopathy 08/31/2020   Adjustment disorder with depressed mood 09/07/2020   Anxiety    Asthma    Chronic abdominal pain    Chronic back pain    Chronic prescription opiate use 02/16/2022  Cirrhosis    Cirrhosis    COPD (chronic obstructive pulmonary disease)    Depression    Diabetes mellitus without complication    Diabetes mellitus, type II    Diverticulosis    Fatty liver 03/07/2020   GERD (gastroesophageal reflux disease)    Hepatitis C    Hernia, abdominal    Hyperglycemia due to type 2 diabetes mellitus 05/24/2020   Hyperlipidemia    IBS (irritable bowel syndrome) 02/16/2022   Insomnia    Long-term current use of methadone for opiate dependence    Lupus    Migraine headache    Morbid obesity 05/24/2020   Neuropathy    Nocturnal seizures    Pain of upper abdomen 07/28/2021   Peptic ulcer    Spleen enlarged    Splenomegaly     Family History  Problem Relation Age of Onset   Hyperlipidemia Maternal Grandfather     Past Surgical History:  Procedure Laterality Date   BIOPSY  11/25/2021   Procedure: BIOPSY;  Surgeon: Dolores Frame, MD;  [facility];  Service: Gastroenterology;;   CHOLECYSTECTOMY     COLONOSCOPY WITH PROPOFOL N/A 11/25/2021   Procedure: COLONOSCOPY  WITH PROPOFOL;  Surgeon: Dolores Frame, MD;  Location: [facility];  Service: Gastroenterology;  Laterality: N/A;  805   COLONOSCOPY WITH PROPOFOL N/A 03/31/2022   Procedure: COLONOSCOPY WITH PROPOFOL;  Surgeon: Dolores Frame, MD;  Location: [facility];  Service: Gastroenterology;  Laterality: N/A;  730   ESOPHAGOGASTRODUODENOSCOPY  06/2020   done at [facility], candida in upper esophagus (treated with diflucan), ulcerative esophagitis at GE junction, gastritis in stomach, single ulcer in duodenal bulb, with duodenal mucosa showing no abnormality. No presence of varices   ESOPHAGOGASTRODUODENOSCOPY (EGD) WITH PROPOFOL N/A 11/25/2021   Procedure: ESOPHAGOGASTRODUODENOSCOPY (EGD) WITH PROPOFOL;  Surgeon: Dolores Frame, MD;  [facility];  Service: Gastroenterology;  Laterality: N/A;   ESOPHAGOGASTRODUODENOSCOPY (EGD) WITH PROPOFOL N/A 03/31/2022   Procedure: ESOPHAGOGASTRODUODENOSCOPY (EGD) WITH PROPOFOL;  Surgeon: Dolores Frame, MD;  Location: [facility];  Service: Gastroenterology;  Laterality: N/A;   Social History   Occupational History   Not on file  Tobacco Use   Smoking status: Every Day    Packs/day: .5    Types: Cigarettes   Smokeless tobacco: Never  Vaping Use   Vaping Use: Former   Substances: Nicotine, Flavoring  Substance and Sexual Activity   Alcohol use: No   Drug use: No   Sexual activity: Not Currently    Birth control/protection: Implant

## 2023-02-11 ENCOUNTER — Inpatient Hospital Stay (HOSPITAL_BASED_OUTPATIENT_CLINIC_OR_DEPARTMENT_OTHER): Payer: Medicaid Other | Admitting: Hematology

## 2023-02-11 ENCOUNTER — Ambulatory Visit (HOSPITAL_COMMUNITY): Payer: Medicaid Other

## 2023-02-11 ENCOUNTER — Inpatient Hospital Stay: Payer: Medicaid Other

## 2023-02-11 VITALS — BP 129/84 | HR 105 | Temp 98.4°F | Resp 18 | Ht 69.0 in | Wt 238.6 lb

## 2023-02-11 DIAGNOSIS — M5459 Other low back pain: Secondary | ICD-10-CM

## 2023-02-11 DIAGNOSIS — I82432 Acute embolism and thrombosis of left popliteal vein: Secondary | ICD-10-CM | POA: Diagnosis not present

## 2023-02-11 DIAGNOSIS — D72819 Decreased white blood cell count, unspecified: Secondary | ICD-10-CM

## 2023-02-11 DIAGNOSIS — D696 Thrombocytopenia, unspecified: Secondary | ICD-10-CM | POA: Diagnosis not present

## 2023-02-11 DIAGNOSIS — B1920 Unspecified viral hepatitis C without hepatic coma: Secondary | ICD-10-CM

## 2023-02-11 DIAGNOSIS — E611 Iron deficiency: Secondary | ICD-10-CM

## 2023-02-11 DIAGNOSIS — M79605 Pain in left leg: Secondary | ICD-10-CM

## 2023-02-11 DIAGNOSIS — K7460 Unspecified cirrhosis of liver: Secondary | ICD-10-CM

## 2023-02-11 LAB — CBC WITH DIFFERENTIAL/PLATELET
Abs Immature Granulocytes: 0 10*3/uL (ref 0.00–0.07)
Basophils Absolute: 0 10*3/uL (ref 0.0–0.1)
Basophils Relative: 1 %
Eosinophils Absolute: 0.5 10*3/uL (ref 0.0–0.5)
Eosinophils Relative: 13 %
HCT: 40.9 % (ref 36.0–46.0)
Hemoglobin: 13.8 g/dL (ref 12.0–15.0)
Immature Granulocytes: 0 %
Lymphocytes Relative: 23 %
Lymphs Abs: 0.9 10*3/uL (ref 0.7–4.0)
MCH: 29.9 pg (ref 26.0–34.0)
MCHC: 33.7 g/dL (ref 30.0–36.0)
MCV: 88.7 fL (ref 80.0–100.0)
Monocytes Absolute: 0.3 10*3/uL (ref 0.1–1.0)
Monocytes Relative: 7 %
Neutro Abs: 2.1 10*3/uL (ref 1.7–7.7)
Neutrophils Relative %: 56 %
Platelets: 47 10*3/uL — ABNORMAL LOW (ref 150–400)
RBC: 4.61 MIL/uL (ref 3.87–5.11)
RDW: 14.1 % (ref 11.5–15.5)
WBC: 3.8 10*3/uL — ABNORMAL LOW (ref 4.0–10.5)
nRBC: 0 % (ref 0.0–0.2)

## 2023-02-11 LAB — D-DIMER, QUANTITATIVE: D-Dimer, Quant: 0.6 ug/mL-FEU — ABNORMAL HIGH (ref 0.00–0.50)

## 2023-02-11 LAB — IRON AND TIBC
Iron: 65 ug/dL (ref 28–170)
Saturation Ratios: 21 % (ref 10.4–31.8)
TIBC: 316 ug/dL (ref 250–450)
UIBC: 251 ug/dL

## 2023-02-11 LAB — FERRITIN: Ferritin: 93 ng/mL (ref 11–307)

## 2023-02-11 MED ORDER — LIDOCAINE 5 % EX OINT
1.0000 | TOPICAL_OINTMENT | CUTANEOUS | 5 refills | Status: DC | PRN
Start: 2023-02-11 — End: 2023-04-27

## 2023-02-11 MED ORDER — ENOXAPARIN SODIUM 100 MG/ML IJ SOSY: 100.0000 mg | PREFILLED_SYRINGE | Freq: Two times a day (BID) | INTRAMUSCULAR | 3 refills | Status: DC

## 2023-02-11 NOTE — Patient Instructions (Addendum)
[facility]  Discharge Instructions  You were seen and examined today by Dr. Ellin Saba. Dr. Ellin Saba is a hematologist, meaning that he specializes in blood abnormalities. Dr. Ellin Saba discussed your past medical history, family history of cancers/blood conditions and the events that led to you being here today.  You were referred to Dr. Ellin Saba due to DVT, a new blood clot in your leg.  Continue Lovenox at this time. You will remain on it for 3 to 6 months. A repeat ultrasound will be done prior to discontinuing Lovenox.  Dr. Ellin Saba has recommended additional labs today for further evaluation.  Follow-up as scheduled.  Thank you for choosing [facility] to provide your oncology and hematology care.   To afford each patient quality time with our provider, please arrive at least 15 minutes before your scheduled appointment time. You may need to reschedule your appointment if you arrive late (10 or more minutes). Arriving late affects you and other patients whose appointments are after yours.  Also, if you miss three or more appointments without notifying the office, you may be dismissed from the clinic at the provider's discretion.    Again, thank you for choosing [facility].  Our hope is that these requests will decrease the amount of time that you wait before being seen by our physicians.   If you have a lab appointment with the Cancer Center - please note that after April 8th, all labs will be drawn in the cancer center.  You do not have to check in or register with the main entrance as you have in the past but will complete your check-in at the cancer center.            _____________________________________________________________  Should you have questions after your visit to [facility], please contact our office at [phone] and follow the prompts.  Our office hours are 8:00 a.m. to 4:30  p.m. Monday - Thursday and 8:00 a.m. to 2:30 p.m. Friday.  Please note that voicemails left after 4:00 p.m. may not be returned until the following business day.  We are closed weekends and all major holidays.  You do have access to a nurse 24-7, just call the main number to the clinic [phone] and do not press any options, hold on the line and a nurse will answer the phone.    For prescription refill requests, have your pharmacy contact our office and allow 72 hours.    Masks are no longer required in the cancer centers. If you would like for your care team to wear a mask while they are taking care of you, please let them know. You may have one support person who is at least 38 years old accompany you for your appointments.
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
## 2023-02-12 ENCOUNTER — Ambulatory Visit: Payer: Medicaid Other | Admitting: Family Medicine

## 2023-02-12 LAB — BETA-2-GLYCOPROTEIN I ABS, IGG/M/A
Beta-2 Glyco I IgG: <9 GPI IgG units (ref 0–20)
Beta-2-Glycoprotein I IgA: <9 GPI IgA units (ref 0–25)
Beta-2-Glycoprotein I IgM: <9 GPI IgM units (ref 0–32)

## 2023-02-12 LAB — LUPUS ANTICOAGULANT PANEL
DRVVT: 35.9 s (ref 0.0–47.0)
PTT Lupus Anticoagulant: 52.7 s — ABNORMAL HIGH (ref 0.0–43.5)

## 2023-02-12 LAB — HEXAGONAL PHASE PHOSPHOLIPID: Hexagonal Phase Phospholipid: 6 s (ref 0–11)

## 2023-02-12 LAB — CARDIOLIPIN ANTIBODIES, IGG, IGM, IGA
Anticardiolipin IgA: 12 APL U/mL — ABNORMAL HIGH (ref 0–11)
Anticardiolipin IgG: 9 GPL U/mL (ref 0–14)
Anticardiolipin IgM: <9 MPL U/mL (ref 0–12)

## 2023-02-12 LAB — PTT-LA MIX: PTT-LA Mix: 48.5 s — ABNORMAL HIGH (ref 0.0–40.5)

## 2023-02-15 ENCOUNTER — Other Ambulatory Visit: Payer: Self-pay | Admitting: Family Medicine

## 2023-02-16 ENCOUNTER — Ambulatory Visit (HOSPITAL_COMMUNITY): Payer: Medicaid Other | Admitting: Physical Therapy

## 2023-02-16 DIAGNOSIS — M5459 Other low back pain: Secondary | ICD-10-CM
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
## 2023-02-23 ENCOUNTER — Ambulatory Visit (HOSPITAL_COMMUNITY): Payer: Medicaid Other

## 2023-02-23 DIAGNOSIS — M5459 Other low back pain: Secondary | ICD-10-CM | POA: Diagnosis not present
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
## 2023-02-24 ENCOUNTER — Other Ambulatory Visit: Payer: Medicaid Other | Admitting: Pharmacist

## 2023-02-24 ENCOUNTER — Other Ambulatory Visit (INDEPENDENT_AMBULATORY_CARE_PROVIDER_SITE_OTHER): Payer: Self-pay | Admitting: Gastroenterology

## 2023-02-24 DIAGNOSIS — K5903 Drug induced constipation: Secondary | ICD-10-CM

## 2023-02-25 ENCOUNTER — Telehealth (HOSPITAL_COMMUNITY): Payer: Self-pay | Admitting: Physical Therapy

## 2023-02-25 ENCOUNTER — Encounter (HOSPITAL_COMMUNITY): Payer: Medicaid Other | Admitting: Physical Therapy

## 2023-02-25 NOTE — Telephone Encounter (Signed)
Pt did not show for scheduled appt.  Called and left voicemail regarding missed appointment and reminder for next.  Lurena Nida, PTA/CLT [facility] Ph: [phone]

## 2023-02-26 ENCOUNTER — Telehealth: Payer: Self-pay | Admitting: Nurse Practitioner

## 2023-02-26 ENCOUNTER — Other Ambulatory Visit (INDEPENDENT_AMBULATORY_CARE_PROVIDER_SITE_OTHER): Payer: Self-pay | Admitting: Gastroenterology

## 2023-02-26 NOTE — Telephone Encounter (Signed)
All those medications are in a shortage right now.  Just hold tight, keep calling from time to time.  Also ask if they have any other dosages of Mounjaro in stock and I can send a lower dose in if needed (which I know is not ideal but will still be helpful during the shortage).

## 2023-02-26 NOTE — Telephone Encounter (Signed)
Pt's mom called and said that the pharmacy advised her that Greggory Keen is out of stock. She wants to know what else can you call in for her

## 2023-03-01 MED ORDER — MOUNJARO 7.5 MG/0.5ML ~~LOC~~ SOAJ: 7.5000 mg | SUBCUTANEOUS | 3 refills | Status: DC

## 2023-03-01 NOTE — Addendum Note (Signed)
Addended by: Dani Gobble on: 03/01/2023 04:24 PM   Modules accepted: Orders

## 2023-03-01 NOTE — Telephone Encounter (Signed)
Noted  

## 2023-03-01 NOTE — Telephone Encounter (Signed)
Mariam's mom said to please send 7.5mg   to [facility] on [address]

## 2023-03-01 NOTE — Telephone Encounter (Signed)
Pt said they do not have any doses in any MG at [facility]. I told her to call around to more pharmacies

## 2023-03-01 NOTE — Telephone Encounter (Signed)
Spoke to pts mother and relayed what Whitney's message stated.

## 2023-03-01 NOTE — Telephone Encounter (Signed)
Noted, thanks!

## 2023-03-01 NOTE — Telephone Encounter (Signed)
done

## 2023-03-02 ENCOUNTER — Encounter (HOSPITAL_COMMUNITY): Payer: Medicaid Other

## 2023-03-04 ENCOUNTER — Telehealth (HOSPITAL_COMMUNITY): Payer: Self-pay | Admitting: Physical Therapy

## 2023-03-04 ENCOUNTER — Encounter (HOSPITAL_COMMUNITY): Payer: Medicaid Other | Admitting: Physical Therapy

## 2023-03-04 NOTE — Telephone Encounter (Signed)
Pt did not show for appointment, second NS today.  Called patient and left voicemail explaining NS policy and importance of showing or cancelling next appt in order to avoid discharge.  All appts cancelled with exception of next appt and pt aware of this.   Lurena Nida, PTA/CLT [facility] Ph: [phone]

## 2023-03-08 ENCOUNTER — Encounter (INDEPENDENT_AMBULATORY_CARE_PROVIDER_SITE_OTHER): Payer: Medicaid Other | Admitting: Gastroenterology

## 2023-03-08 ENCOUNTER — Encounter (INDEPENDENT_AMBULATORY_CARE_PROVIDER_SITE_OTHER): Payer: Self-pay | Admitting: Gastroenterology

## 2023-03-08 ENCOUNTER — Ambulatory Visit (INDEPENDENT_AMBULATORY_CARE_PROVIDER_SITE_OTHER): Payer: No Typology Code available for payment source | Admitting: Gastroenterology

## 2023-03-08 VITALS — BP 110/72 | HR 114 | Temp 98.6°F

## 2023-03-08 DIAGNOSIS — R112 Nausea with vomiting, unspecified: Secondary | ICD-10-CM

## 2023-03-08 DIAGNOSIS — K7460 Unspecified cirrhosis of liver: Secondary | ICD-10-CM

## 2023-03-08 DIAGNOSIS — K625 Hemorrhage of anus and rectum: Secondary | ICD-10-CM | POA: Diagnosis not present

## 2023-03-08 DIAGNOSIS — K920 Hematemesis: Secondary | ICD-10-CM | POA: Diagnosis not present

## 2023-03-08 MED ORDER — ONDANSETRON HCL 4 MG PO TABS: 4.0000 mg | ORAL_TABLET | Freq: Three times a day (TID) | ORAL | 1 refills | Status: DC | PRN

## 2023-03-08 MED ORDER — OMEPRAZOLE 40 MG PO CPDR
40.0000 mg | DELAYED_RELEASE_CAPSULE | Freq: Two times a day (BID) | ORAL | 3 refills | Status: DC
Start: 2023-03-08 — End: 2023-05-10

## 2023-03-08 MED ORDER — SUCRALFATE 1 GM/10ML PO SUSP: 1.0000 g | Freq: Four times a day (QID) | ORAL | 1 refills | Status: DC

## 2023-03-08 NOTE — Patient Instructions (Signed)
Recommend proceeding with colonoscopy given ongoing rectal bleeding and not being able to complete the last ordered one, I will also discuss possibly repeating upper endoscopy at the same time, with Dr. Levon Hedger. We will need to get clearance prior to proceeding with these due to you being on lovenox for the DVT  -we will update liver imaging and labs for your cirrhosis   -Stop protonix and start omeprazole 40mg  twice daily  -I have sent Carafate 1g to be taken 4x/day to help with your abdominal pain -zofran 4mg  every 8 hours as needed for nausea  Follow up 3 months

## 2023-03-08 NOTE — Progress Notes (Signed)
Referring Provider: Wylene Men* Primary Care Physician:  Rica Records, FNP Primary GI Physician: Levon Hedger   Chief Complaint  Patient presents with   Abdominal Pain    Mid abdominal pain. Started about 2 months. Has nausea and vomiting with the pain.    Blood In Stools    Has been seeing blood in stool for about 2 months. Some dizziness, sob.    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 female with past medical history of   Patient presenting today for cirrhosis epigastric pain, nausea, vomiting and rectal bleeding  Last seen July 2023, at that time taking 2 capfuls of Miralax every day. Also takes Linzess 290 mcg qday. Stool is not hard. She reports that every time she moves her bowels she sees blood in her stool which has been concerning for her. Denies having any rectal pain or discomfort in the rectum.   presents persistent episodes of pain in her abdomen diffusely which has been present for months and is similar to the pain she has endorsed during previous encounters. She reports having some nausea and occasional vomiting. Takes Phenergan, which helps sometimes only takes it as needed. She reports having some dark "coffee" stools. legs and ankles occasionally swell, but is not completely controlled with furosemide and spironolactone.  Recommended to schedule colonoscopy (7 day liquid diet and 2 day prep), continue linzess+movantik, meld labs, CBC, US liver.  [place] of liver not completed, colonoscopy was cancelled  Notably with [facility], underwent EGD for coffee ground emesis, concern for esophageal candida and nodular gastritis. No EVs. Attempted Para but not enough ascites to complete. Given SBP prophylaxis, started on fluconazole, continue PPI BID.    Last hgb 14.3 on 02/04/23  Present:  She notes ongoing nausea, vomiting and mid abdominal pain. She is having some coffee ground emesis, noting this everytime she vomits, vomiting seems to depend on what she eats. She is not taking anything for the nausea. She took zofran previously with some improvement. She did not note improvement in symptoms with fluconazole therapy, current symptoms have persisted since  prior to EGD. Eating a heavy meal brings on epigastric pain. No real alleviating factors. No heart burn or acid reflux. She is taking pantoprazole BID. She has had some weight loss. Carafate is listed on her med list but she is not currently taking this, does note improvement previously when she took it. Denies any issues with dysphagia. She has been on protonix. She does not take any NSAIDs. Continues to have intermittent rectal bleeding and notes usually 1 BM per day without issues with constipation though notably not on any therapy for her constipation currently. No melena. Denies swelling to her abdomen, episodes of confusion, jaundice, pruritus.    She notes thrombocytopenia, she is seeing cancer center at [facility] for this as well as DVT in LE which she is on lovenox for, plans to repeat doppler in July, d/c Lovenox then if clot resolved.   Previous MELD 3.0: 9  Cirrhosis related questions: Episodes of confusion/disorientation: no  Taking diuretics? Lasix 40mg  daily, spironolactone 100mg  daily  Beta blockers? Propranolol 10mg  TID (not for EVs) Prior history of variceal banding? No  Prior episodes of SBP? No  Last liver imaging: last dedicated liver  imaging 08/2021 Alcohol use: no  Last Colonoscopy: 03/2022 - Preparation of the colon was poor.                           - Hemorrhoids found on perianal exam.                           - Stool in the rectum, in the sigmoid colon, in the                            descending colon and in the transverse colon.                           - No  specimens collected (repeat at age 16) Last EGD: 01/2023 [facility] esophageal plaques concerning for candida. Biopsied  - Nodular gastritis. Biopsied  (Mild chronic inactive gastritis with focal glandular epithelial reactive changes, No intestinal metaplasia, dysplasia or malignancy is seen. Microorganisms morphologically consistent with H pylori are not identified, no fungal infection seen on esophageal biopsy)    Recommendations:    Past Medical History:  Diagnosis Date   Acute encephalopathy 08/31/2020   Adjustment disorder with depressed mood 09/07/2020   Anxiety    Asthma    Chronic abdominal pain    Chronic back pain    Chronic prescription opiate use 02/16/2022   Cirrhosis (HCC)    Cirrhosis (HCC)    COPD (chronic obstructive pulmonary disease) (HCC)    Depression    Diabetes mellitus without complication (HCC)    Diabetes mellitus, type II (HCC)    Diverticulosis    Fatty liver 03/07/2020   GERD (gastroesophageal reflux disease)    Hepatitis C    Hernia, abdominal    Hyperglycemia due to type 2 diabetes mellitus (HCC) 05/24/2020   Hyperlipidemia    IBS (irritable bowel syndrome) 02/16/2022   Insomnia    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)    Lupus (HCC)    Migraine headache    Morbid obesity (HCC) 05/24/2020   Neuropathy    Nocturnal seizures (HCC)    Pain of upper abdomen 07/28/2021   Peptic ulcer    Spleen enlarged    Splenomegaly     Past Surgical History:  Procedure Laterality Date   BIOPSY  11/25/2021   Procedure: BIOPSY;  Surgeon: Dolores Frame, MD;  [facility]: [facility];  Service: Gastroenterology;;   CHOLECYSTECTOMY     COLONOSCOPY WITH PROPOFOL N/A 11/25/2021   Procedure: COLONOSCOPY WITH PROPOFOL;  Surgeon: Dolores Frame, MD;  Location: [facility];  Service: Gastroenterology;  Laterality: N/A;  805   COLONOSCOPY WITH PROPOFOL N/A 03/31/2022   Procedure: COLONOSCOPY WITH PROPOFOL;  Surgeon:  Dolores Frame, MD;  Location: [facility];  Service: Gastroenterology;  Laterality: N/A;  730   ESOPHAGOGASTRODUODENOSCOPY  06/2020   done at [facility], candida in upper esophagus (treated with diflucan), ulcerative esophagitis at GE junction, gastritis in stomach, single ulcer in duodenal bulb, with duodenal mucosa showing no abnormality. No presence of varices   ESOPHAGOGASTRODUODENOSCOPY (EGD) WITH PROPOFOL N/A 11/25/2021   Procedure: ESOPHAGOGASTRODUODENOSCOPY (EGD) WITH PROPOFOL;  Surgeon: Dolores Frame, MD;  Location: [facility];  Service: Gastroenterology;  Laterality: N/A;   ESOPHAGOGASTRODUODENOSCOPY (EGD) WITH PROPOFOL N/A 03/31/2022   Procedure: ESOPHAGOGASTRODUODENOSCOPY (EGD) WITH PROPOFOL;  Surgeon:  Dolores Frame, MD;  Location: [facility];  Service: Gastroenterology;  Laterality: N/A;    Current Outpatient Medications  Medication Sig Dispense Refill   Accu-Chek Softclix Lancets lancets USE TO TEST BLOOD SUGAR 4 TIMES A DAY. 150 each 3   albuterol (VENTOLIN HFA) 108 (90 Base) MCG/ACT inhaler Inhale 2 puffs into the lungs every 6 (six) hours as needed for wheezing or shortness of breath. 8 g 0   aspirin EC 81 MG tablet Take 81 mg by mouth daily. Swallow whole.     cetirizine (ZYRTEC ALLERGY) 10 MG tablet Take 1 tablet (10 mg total) by mouth daily. 30 tablet 0   clonazePAM (KLONOPIN) 0.5 MG tablet Take 0.5 mg by mouth 2 (two) times daily.     Continuous Blood Gluc Sensor (DEXCOM G6 SENSOR) MISC Change sensor every 10 days. 6 each 3   Continuous Blood Gluc Transmit (DEXCOM G6 TRANSMITTER) MISC USE AS DIRECTED. 1 each 1   cyclobenzaprine (FLEXERIL) 10 MG tablet Take 1 tablet (10 mg total) by mouth 3 (three) times daily as needed for muscle spasms. 30 tablet 0   DULoxetine (CYMBALTA) 30 MG capsule Take 1 capsule (30 mg total) by mouth daily. 90 capsule 0   enoxaparin (LOVENOX) 100 MG/ML injection Inject 1 mL (100 mg total) into the skin every 12  (twelve) hours. 60 mL 3   etonogestrel (NEXPLANON) 68 MG IMPL implant 1 each by Subdermal route once.     famotidine (PEPCID) 20 MG tablet Take 1 tablet (20 mg total) by mouth 2 (two) times daily. 30 tablet 0   ferrous sulfate 325 (65 FE) MG EC tablet Take 1 tablet (325 mg total) by mouth daily with breakfast. 90 tablet 3   furosemide (LASIX) 40 MG tablet Take 40 mg by mouth daily.     glipiZIDE (GLUCOTROL XL) 5 MG 24 hr tablet Take 1 tablet (5 mg total) by mouth daily with breakfast. 90 tablet 1   glucose blood (ACCU-CHEK GUIDE) test strip Use as instructed to check blood glucose four times daily 150 each 5   hyoscyamine (ANASPAZ) 0.125 MG TBDP disintergrating tablet Place 0.125 mg under the tongue. One tid prn     Insulin Pen Needle (GLOBAL EASE INJECT PEN NEEDLES) 31G X 8 MM MISC Use 1 pen needle 4 times daily for insulin. 400 each 0   Insulin Pen Needle (PEN NEEDLES) 32G X 6 MM MISC 1 each by Does not apply route 3 (three) times daily. 100 each 3   insulin regular human CONCENTRATED (HUMULIN R U-500 KWIKPEN) 500 UNIT/ML KwikPen Inject 110 Units into the skin 3 (three) times daily with meals. 60 mL 3   lidocaine (XYLOCAINE) 5 % ointment Apply 1 Application topically as needed. 35.44 g 5   LINZESS 290 MCG CAPS capsule Take 290 mcg by mouth daily.     lisinopril (ZESTRIL) 5 MG tablet TAKE 1 TABLET ONCE DAILY. 30 tablet 0   lubiprostone (AMITIZA) 24 MCG capsule Take 1 capsule (24 mcg total) by mouth 2 (two) times daily with a meal. 180 capsule 3   metFORMIN (GLUCOPHAGE) 1000 MG tablet Take 1 tablet (1,000 mg total) by mouth 2 (two) times daily with a meal. 180 tablet 3   methocarbamol (ROBAXIN) 500 MG tablet Take 500 mg by mouth at bedtime.     Multiple Vitamin (MULTIVITAMIN) tablet Take 1 tablet by mouth daily.     ondansetron (ZOFRAN-ODT) 4 MG disintegrating tablet Take 1 tablet (4 mg total) by mouth every  8 (eight) hours as needed for nausea or vomiting. 12 tablet 0   pantoprazole (PROTONIX) 40  MG tablet Take 1 tablet (40 mg total) by mouth 2 (two) times daily for 14 days. 28 tablet 0   potassium chloride (KLOR-CON M) 10 MEQ tablet Take 10 mEq by mouth 2 (two) times daily.     pregabalin (LYRICA) 75 MG capsule Take 75 mg by mouth in the morning, at noon, in the evening, and at bedtime.      PRESCRIPTION MEDICATION Methodone once daily     propranolol (INDERAL) 10 MG tablet Take 10 mg by mouth 3 (three) times daily.     QUEtiapine (SEROQUEL) 25 MG tablet Take 25 mg by mouth at bedtime.     RESTASIS 0.05 % ophthalmic emulsion Place 1 drop into both eyes as needed (dry eye).      rosuvastatin (CRESTOR) 10 MG tablet Take 1 tablet (10 mg total) by mouth daily. 90 tablet 3   spironolactone (ALDACTONE) 100 MG tablet Take 100 mg by mouth daily.      sucralfate (CARAFATE) 1 GM/10ML suspension Take 10 mLs (1 g total) by mouth 4 (four) times daily -  with meals and at bedtime. 420 mL 0   tirzepatide (MOUNJARO) 7.5 MG/0.5ML Pen Inject 7.5 mg into the skin once a week. 6 mL 3   triamcinolone (KENALOG) 0.1 % Apply 1 application topically daily as needed (irritation).     Vitamin D, Ergocalciferol, (DRISDOL) 1.25 MG (50000 UNIT) CAPS capsule Take 1 capsule (50,000 Units total) by mouth every 7 (seven) days. (Patient taking differently: Take 50,000 Units by mouth every Tuesday.) 12 capsule 0   ARIPiprazole (ABILIFY) 20 MG tablet Take 20 mg by mouth daily. (Patient not taking: Reported on 03/08/2023)     naloxegol oxalate (MOVANTIK) 25 MG TABS tablet Take 1 tablet (25 mg total) by mouth daily. (Patient not taking: Reported on 03/08/2023) 90 tablet 3   nystatin (MYCOSTATIN/NYSTOP) powder Apply 1 application topically 3 (three) times daily. (Patient not taking: Reported on 03/08/2023) 15 g 0   oxyCODONE (ROXICODONE) 5 MG immediate release tablet Take 1 tablet (5 mg total) by mouth every 4 (four) hours as needed for severe pain. (Patient not taking: Reported on 03/08/2023) 12 tablet 0   polyethylene  glycol-electrolytes (TRILYTE) 420 g solution Take 4,000 mLs by mouth as directed. (Patient not taking: Reported on 03/08/2023) 4000 mL 0   promethazine (PHENERGAN) 12.5 MG tablet Take 1 tablet (12.5 mg total) by mouth every 8 (eight) hours as needed for nausea or vomiting. (Patient not taking: Reported on 03/08/2023) 30 tablet 2   Suvorexant (BELSOMRA) 20 MG TABS Take 20 mg by mouth at bedtime.     No current facility-administered medications for this visit.    Allergies as of 03/08/2023 - Review Complete 03/08/2023  Allergen Reaction Noted   Iodine-131 Anaphylaxis, Shortness Of Breath, and Swelling 05/01/2018   Ivp dye [iodinated contrast media] Anaphylaxis 10/07/2013   Ketorolac tromethamine Shortness Of Breath 12/22/2010   Tylenol [acetaminophen] Other (See Comments) 01/23/2019   Vancomycin Swelling 05/15/2018   Gabapentin Itching 05/28/2020   Nsaids Other (See Comments) 12/22/2010   Suboxone [buprenorphine hcl-naloxone hcl]  05/28/2020    Family History  Problem Relation Age of Onset   Hyperlipidemia Maternal Grandfather     Social History   Socioeconomic History   Marital status: Single    Spouse name: Not on file   Number of children: Not on file   Years of education: Not  on file   Highest education level: Not on file  Occupational History   Not on file  Tobacco Use   Smoking status: Every Day    Packs/day: .5    Types: Cigarettes    Passive exposure: Current   Smokeless tobacco: Never  Vaping Use   Vaping Use: Former   Substances: Nicotine, Flavoring  Substance and Sexual Activity   Alcohol use: No   Drug use: No   Sexual activity: Not Currently    Birth control/protection: Implant  Other Topics Concern   Not on file  Social History Narrative   Not on file   Social Determinants of Health   Financial Resource Strain: Medium Risk (05/09/2020)   Overall Financial Resource Strain (CARDIA)    Difficulty of Paying Living Expenses: Somewhat hard  Food Insecurity:  Food Insecurity Present (01/14/2023)   Hunger Vital Sign    Worried About Running Out of Food in the Last Year: Sometimes true    Ran Out of Food in the Last Year: Never true  Transportation Needs: No Transportation Needs (01/14/2023)   PRAPARE - Administrator, Civil Service (Medical): No    Lack of Transportation (Non-Medical): No  Physical Activity: Insufficiently Active (05/09/2020)   Exercise Vital Sign    Days of Exercise per Week: 1 day    Minutes of Exercise per Session: 20 min  Stress: Stress Concern Present (05/09/2020)   [employer] of Occupational Health - Occupational Stress Questionnaire    Feeling of Stress : Very much  Social Connections: Moderately Isolated (05/09/2020)   Social Connection and Isolation Panel [NHANES]    Frequency of Communication with Friends and Family: More than three times a week    Frequency of Social Gatherings with Friends and Family: Twice a week    Attends Religious Services: More than 4 times per year    Active Member of [employer] or Organizations: No    Attends Engineer, structural: Never    Marital Status: Never married    Review of systems General: negative for malaise, night sweats, fever, chills, weight los Neck: Negative for lumps, goiter, pain and significant neck swelling Resp: Negative for cough, wheezing, dyspnea at rest CV: Negative for chest pain, leg swelling, palpitations, orthopnea GI: denies melena, hematochezia, diarrhea, constipation, dysphagia, odyonophagia, early satiety or unintentional weight loss. +nausea +vomiting +abd pain +coffee ground emesis  MSK: Negative for joint pain or swelling, back pain, and muscle pain. Derm: Negative for itching or rash Psych: Denies depression, anxiety, memory loss, confusion. No homicidal or suicidal ideation.  Heme: Negative for prolonged bleeding, bruising easily, and swollen nodes. Endocrine: Negative for cold or heat intolerance, polyuria, polydipsia and  goiter. Neuro: negative for tremor, gait imbalance, syncope and seizures. The remainder of the review of systems is noncontributory.  Physical Exam: BP 110/72 (BP Location: Left Arm, Patient Position: Sitting, Cuff Size: Large)   Pulse (!) 114   Temp 98.6 F (37 C) (Oral)  General:   Alert and oriented. No distress noted. Pleasant and cooperative.  Head:  Normocephalic and atraumatic. Eyes:  Conjuctiva clear without scleral icterus. Mouth:  Oral mucosa pink and moist. Good dentition. No lesions. Heart: Normal rate and rhythm, s1 and s2 heart sounds present.  Lungs: Clear lung sounds in all lobes. Respirations equal and unlabored. Abdomen:  +BS, soft, non-tender and non-distended. No rebound or guarding. No HSM or masses noted. Derm: No palmar erythema or jaundice Msk:  Symmetrical without gross deformities. Normal posture. Extremities:  Without edema. Neurologic:  Alert and  oriented x4 Psych:  Alert and cooperative. Normal mood and affect.  Invalid input(s): "6 MONTHS"   ASSESSMENT: Jaime [name] is a 37 y.o. female presenting today for follow up of cirrhosis, rectal bleeding, epigastric pain, nausea, vomiting.  Cirrhosis: history of decompensation. Last MELD was 9. Recent EGD without EVs. She denies episodes of confusion or ascites. She is on lasix 40mg  daily, spironolactone 100mg  daily. She is due for AFP tumor marker and needs to have HCC screening updated as last dedicated liver imaging was in October 2022. She has history of poor compliance in the past.   Rectal bleeding: though possible to IBS-C previously, she was recommended to have colonosocpy for further evaluation which was cancelled. Notably not on anything for constipation currently and denies any issues with this and reportedly not on any therapy for her constipation. Continues to have rectal bleeding. Currently on lovenox for DVT, recommend proceeding with colonoscopy as previously recommended, however, this will have  to be done once she is off lovenox or this can be safely held for endoscopic procedures.   Epigastric pain/nausea/vomiting: epigastric pain, nausea and coffee ground emesis for the past few months, [facility] without EVs, concern for candida esophagitis which she was given fluconazole for, however, biopsy was not consistent with this. No H pylori. She did have nodular gastritis. Notes continuation of symptoms. Eating makes her epigastric pain worse. Will discuss with Dr. Levon Hedger, but given ongoing symptoms, may need to repeat EGD for further evaluation. As above, currently on lovenox for DVT, therefore endoscopic evaluations will have to be avoided until she comes off of this or it can be safely held prior to endo evaluations.   PLAN:  -Colonoscopy, +/- EGD-on lovenox, will need clearance to hold-ASA III - AFP tumor marker  - US liver  - Stop protonix and start omeprazole 40mg  daily  -Carafate 1g QID  -Zofran 4mg  Q8h PRN for nausea -continue with lasix 40mg  and spironolactone 100mg  daily  - Reduce salt intake to <2 g per day - Can take Tylenol max of 2 g per day (650 mg q8h) for pain - Avoid NSAIDs for pain - Avoid eating raw oysters/shellfish - Ensure every night before going to sleep   All questions were answered, patient verbalized understanding and is in agreement with plan as outlined above.    Follow Up: 3 months   Alitza Cowman L. Jeanmarie Hubert, MSN, APRN, AGNP-C Adult-Gerontology Nurse Practitioner [facility]  I have reviewed the note and agree with the APP's assessment as described in this progress note  Katrinka Blazing, MD Gastroenterology and Hepatology [facility]

## 2023-03-09 ENCOUNTER — Encounter (HOSPITAL_COMMUNITY): Payer: Medicaid Other

## 2023-03-09 LAB — AFP TUMOR MARKER: AFP, Serum, Tumor Marker: 3.1 ng/mL (ref 0.0–6.4)

## 2023-03-11 ENCOUNTER — Encounter (HOSPITAL_COMMUNITY): Payer: Medicaid Other

## 2023-03-11 ENCOUNTER — Inpatient Hospital Stay: Payer: Medicaid Other | Attending: Hematology

## 2023-03-11 ENCOUNTER — Inpatient Hospital Stay (HOSPITAL_BASED_OUTPATIENT_CLINIC_OR_DEPARTMENT_OTHER): Payer: Medicaid Other | Admitting: Physician Assistant

## 2023-03-11 ENCOUNTER — Encounter (HOSPITAL_COMMUNITY): Payer: Self-pay

## 2023-03-11 VITALS — BP 117/75 | HR 96 | Temp 96.0°F | Resp 16 | Wt 233.6 lb

## 2023-03-11 DIAGNOSIS — I82432 Acute embolism and thrombosis of left popliteal vein: Secondary | ICD-10-CM | POA: Diagnosis not present

## 2023-03-11 DIAGNOSIS — D696 Thrombocytopenia, unspecified: Secondary | ICD-10-CM

## 2023-03-11 DIAGNOSIS — F17210 Nicotine dependence, cigarettes, uncomplicated: Secondary | ICD-10-CM | POA: Insufficient documentation

## 2023-03-11 DIAGNOSIS — Z79899 Other long term (current) drug therapy: Secondary | ICD-10-CM | POA: Diagnosis not present

## 2023-03-11 DIAGNOSIS — E611 Iron deficiency: Secondary | ICD-10-CM

## 2023-03-11 DIAGNOSIS — D72819 Decreased white blood cell count, unspecified: Secondary | ICD-10-CM | POA: Insufficient documentation

## 2023-03-11 DIAGNOSIS — K7460 Unspecified cirrhosis of liver: Secondary | ICD-10-CM | POA: Diagnosis not present

## 2023-03-11 LAB — CBC WITH DIFFERENTIAL/PLATELET
Abs Immature Granulocytes: 0.03 10*3/uL (ref 0.00–0.07)
Basophils Absolute: 0.1 10*3/uL (ref 0.0–0.1)
Basophils Relative: 1 %
Eosinophils Absolute: 0.5 10*3/uL (ref 0.0–0.5)
Eosinophils Relative: 8 %
HCT: 42.1 % (ref 36.0–46.0)
Hemoglobin: 14.1 g/dL (ref 12.0–15.0)
Immature Granulocytes: 1 %
Lymphocytes Relative: 19 %
Lymphs Abs: 1 10*3/uL (ref 0.7–4.0)
MCH: 30.2 pg (ref 26.0–34.0)
MCHC: 33.5 g/dL (ref 30.0–36.0)
MCV: 90.1 fL (ref 80.0–100.0)
Monocytes Absolute: 0.4 10*3/uL (ref 0.1–1.0)
Monocytes Relative: 8 %
Neutro Abs: 3.5 10*3/uL (ref 1.7–7.7)
Neutrophils Relative %: 63 %
Platelets: 61 10*3/uL — ABNORMAL LOW (ref 150–400)
RBC: 4.67 MIL/uL (ref 3.87–5.11)
RDW: 15.1 % (ref 11.5–15.5)
WBC: 5.4 10*3/uL (ref 4.0–10.5)
nRBC: 0 % (ref 0.0–0.2)

## 2023-03-11 NOTE — Progress Notes (Signed)
[facility] [address], [place] [address]   Clinic Day:  03/11/2023  Referring physician: Wylene Men*  Patient Care Team: Del Newman Nip, Tenna Child, FNP as PCP - General (Family Medicine)   CHIEF COMPLAINT/PURPOSE OF CONSULT:   Diagnosis: DVT and thrombocytopenia  Current Therapy: Lovenox 100 mg every 12 hours  HISTORY OF PRESENT ILLNESS:   Jaime [name] is a 37 y.o. female presenting to clinic today for evaluation of acute DVT. She was last seen by Dr. Ellin Saba on 02/11/2023.   She presents today with stable energy levels. She still has fatigue which impacts her ADLs. She reports decreased appetite and has lost approximately 5 lbs since 02/11/2023. She has some nausea with vomiting which improves with zofran PRN. She denies abdominal pain. Her bowel habits are regular. She denies any swelling in her lower legs. She does have stable shortness of breath with exertion but none at rest. She struggles with insomnia with no improvement with OTC melatonin. She is tolerating Lovenox injections without any bruising or bleeding. She has no other complaints. Rest of the ROS is below.   PAST MEDICAL HISTORY:   Past Medical History: Past Medical History:  Diagnosis Date   Acute encephalopathy 08/31/2020   Adjustment disorder with depressed mood 09/07/2020   Anxiety    Asthma    Chronic abdominal pain    Chronic back pain    Chronic prescription opiate use 02/16/2022   Cirrhosis (HCC)    Cirrhosis (HCC)    COPD (chronic obstructive pulmonary disease) (HCC)    Depression    Diabetes mellitus without complication (HCC)    Diabetes mellitus, type II (HCC)    Diverticulosis    Fatty liver 03/07/2020   GERD (gastroesophageal reflux disease)    Hepatitis C    Hernia, abdominal    Hyperglycemia due to type 2 diabetes mellitus (HCC) 05/24/2020   Hyperlipidemia    IBS (irritable bowel syndrome) 02/16/2022   Insomnia    Long-term current use of methadone for  opiate dependence (HCC)    Lupus (HCC)    Migraine headache    Morbid obesity (HCC) 05/24/2020   Neuropathy    Nocturnal seizures (HCC)    Pain of upper abdomen 07/28/2021   Peptic ulcer    Spleen enlarged    Splenomegaly     Surgical History: Past Surgical History:  Procedure Laterality Date   BIOPSY  11/25/2021   Procedure: BIOPSY;  Surgeon: Dolores Frame, MD;  [facility];  Service: Gastroenterology;;   CHOLECYSTECTOMY     COLONOSCOPY WITH PROPOFOL N/A 11/25/2021   Procedure: COLONOSCOPY WITH PROPOFOL;  Surgeon: Dolores Frame, MD;  [facility];  Service: Gastroenterology;  Laterality: N/A;  805   COLONOSCOPY WITH PROPOFOL N/A 03/31/2022   Procedure: COLONOSCOPY WITH PROPOFOL;  Surgeon: Dolores Frame, MD;  [facility];  Service: Gastroenterology;  Laterality: N/A;  730   ESOPHAGOGASTRODUODENOSCOPY  06/2020   done at [facility], candida in upper esophagus (treated with diflucan), ulcerative esophagitis at GE junction, gastritis in stomach, single ulcer in duodenal bulb, with duodenal mucosa showing no abnormality. No presence of varices   ESOPHAGOGASTRODUODENOSCOPY (EGD) WITH PROPOFOL N/A 11/25/2021   Procedure: ESOPHAGOGASTRODUODENOSCOPY (EGD) WITH PROPOFOL;  Surgeon: Dolores Frame, MD;  [facility];  Service: Gastroenterology;  Laterality: N/A;   ESOPHAGOGASTRODUODENOSCOPY (EGD) WITH PROPOFOL N/A 03/31/2022   Procedure: ESOPHAGOGASTRODUODENOSCOPY (EGD) WITH PROPOFOL;  Surgeon: Dolores Frame, MD;  [facility];  Service: Gastroenterology;  Laterality: N/A;    Social History: Social History   Socioeconomic History   Marital status: Single    Spouse name: Not on file   Number of children: Not on file   Years of education: Not on file   Highest education level: Not on file  Occupational History   Not on file  Tobacco Use   Smoking status: Every Day    Packs/day: .5     Types: Cigarettes    Passive exposure: Current   Smokeless tobacco: Never  Vaping Use   Vaping Use: Former   Substances: Nicotine, Flavoring  Substance and Sexual Activity   Alcohol use: No   Drug use: No   Sexual activity: Not Currently    Birth control/protection: Implant  Other Topics Concern   Not on file  Social History Narrative   Not on file   Social Determinants of Health   Financial Resource Strain: Medium Risk (05/09/2020)   Overall Financial Resource Strain (CARDIA)    Difficulty of Paying Living Expenses: Somewhat hard  Food Insecurity: Food Insecurity Present (01/14/2023)   Hunger Vital Sign    Worried About Running Out of Food in the Last Year: Sometimes true    Ran Out of Food in the Last Year: Never true  Transportation Needs: No Transportation Needs (01/14/2023)   PRAPARE - Administrator, Civil Service (Medical): No    Lack of Transportation (Non-Medical): No  Physical Activity: Insufficiently Active (05/09/2020)   Exercise Vital Sign    Days of Exercise per Week: 1 day    Minutes of Exercise per Session: 20 min  Stress: Stress Concern Present (05/09/2020)   [employer] of Occupational Health - Occupational Stress Questionnaire    Feeling of Stress : Very much  Social Connections: Moderately Isolated (05/09/2020)   Social Connection and Isolation Panel [NHANES]    Frequency of Communication with Friends and Family: More than three times a week    Frequency of Social Gatherings with Friends and Family: Twice a week    Attends Religious Services: More than 4 times per year    Active Member of [employer] or Organizations: No    Attends Banker Meetings: Never    Marital Status: Never married  Intimate Partner Violence: Not At Risk (01/14/2023)   Humiliation, Afraid, Rape, and Kick questionnaire    Fear of Current or Ex-Partner: No    Emotionally Abused: No    Physically Abused: No    Sexually Abused: No    Family History: Family  History  Problem Relation Age of Onset   Hyperlipidemia Maternal Grandfather     Current Medications:  Current Outpatient Medications:    Accu-Chek Softclix Lancets lancets, USE TO TEST BLOOD SUGAR 4 TIMES A DAY., Disp: 150 each, Rfl: 3   albuterol (VENTOLIN HFA) 108 (90 Base) MCG/ACT inhaler, Inhale 2 puffs into the lungs every 6 (six) hours as needed for wheezing or shortness of breath., Disp: 8 g, Rfl: 0   ARIPiprazole (ABILIFY) 20 MG tablet, Take 20 mg by mouth daily., Disp: , Rfl:    aspirin EC 81 MG tablet, Take 81 mg by mouth daily. Swallow whole., Disp: , Rfl:    cetirizine (ZYRTEC ALLERGY) 10 MG tablet, Take 1 tablet (10 mg total) by mouth daily., Disp: 30 tablet, Rfl: 0   clonazePAM (KLONOPIN) 0.5 MG tablet, Take 0.5 mg by mouth 2 (two) times daily., Disp: , Rfl:    Continuous Blood Gluc  Sensor (DEXCOM G6 SENSOR) MISC, Change sensor every 10 days., Disp: 6 each, Rfl: 3   Continuous Blood Gluc Transmit (DEXCOM G6 TRANSMITTER) MISC, USE AS DIRECTED., Disp: 1 each, Rfl: 1   cyclobenzaprine (FLEXERIL) 10 MG tablet, Take 1 tablet (10 mg total) by mouth 3 (three) times daily as needed for muscle spasms., Disp: 30 tablet, Rfl: 0   DULoxetine (CYMBALTA) 30 MG capsule, Take 1 capsule (30 mg total) by mouth daily., Disp: 90 capsule, Rfl: 0   enoxaparin (LOVENOX) 100 MG/ML injection, Inject 1 mL (100 mg total) into the skin every 12 (twelve) hours., Disp: 60 mL, Rfl: 3   etonogestrel (NEXPLANON) 68 MG IMPL implant, 1 each by Subdermal route once., Disp: , Rfl:    famotidine (PEPCID) 20 MG tablet, Take 1 tablet (20 mg total) by mouth 2 (two) times daily., Disp: 30 tablet, Rfl: 0   ferrous sulfate 325 (65 FE) MG EC tablet, Take 1 tablet (325 mg total) by mouth daily with breakfast., Disp: 90 tablet, Rfl: 3   furosemide (LASIX) 40 MG tablet, Take 40 mg by mouth daily., Disp: , Rfl:    glipiZIDE (GLUCOTROL XL) 5 MG 24 hr tablet, Take 1 tablet (5 mg total) by mouth daily with breakfast., Disp: 90  tablet, Rfl: 1   glucose blood (ACCU-CHEK GUIDE) test strip, Use as instructed to check blood glucose four times daily, Disp: 150 each, Rfl: 5   hyoscyamine (ANASPAZ) 0.125 MG TBDP disintergrating tablet, Place 0.125 mg under the tongue. One tid prn, Disp: , Rfl:    Insulin Pen Needle (GLOBAL EASE INJECT PEN NEEDLES) 31G X 8 MM MISC, Use 1 pen needle 4 times daily for insulin., Disp: 400 each, Rfl: 0   Insulin Pen Needle (PEN NEEDLES) 32G X 6 MM MISC, 1 each by Does not apply route 3 (three) times daily., Disp: 100 each, Rfl: 3   insulin regular human CONCENTRATED (HUMULIN R U-500 KWIKPEN) 500 UNIT/ML KwikPen, Inject 110 Units into the skin 3 (three) times daily with meals., Disp: 60 mL, Rfl: 3   lidocaine (XYLOCAINE) 5 % ointment, Apply 1 Application topically as needed., Disp: 35.44 g, Rfl: 5   LINZESS 290 MCG CAPS capsule, Take 290 mcg by mouth daily., Disp: , Rfl:    lisinopril (ZESTRIL) 5 MG tablet, TAKE 1 TABLET ONCE DAILY., Disp: 30 tablet, Rfl: 0   lubiprostone (AMITIZA) 24 MCG capsule, Take 1 capsule (24 mcg total) by mouth 2 (two) times daily with a meal., Disp: 180 capsule, Rfl: 3   metFORMIN (GLUCOPHAGE) 1000 MG tablet, Take 1 tablet (1,000 mg total) by mouth 2 (two) times daily with a meal., Disp: 180 tablet, Rfl: 3   methocarbamol (ROBAXIN) 500 MG tablet, Take 500 mg by mouth at bedtime., Disp: , Rfl:    Multiple Vitamin (MULTIVITAMIN) tablet, Take 1 tablet by mouth daily., Disp: , Rfl:    nystatin (MYCOSTATIN/NYSTOP) powder, Apply 1 application topically 3 (three) times daily., Disp: 15 g, Rfl: 0   omeprazole (PRILOSEC) 40 MG capsule, Take 1 capsule (40 mg total) by mouth 2 (two) times daily., Disp: 60 capsule, Rfl: 3   ondansetron (ZOFRAN) 4 MG tablet, Take 1 tablet (4 mg total) by mouth every 8 (eight) hours as needed for nausea or vomiting., Disp: 20 tablet, Rfl: 1   oxyCODONE (ROXICODONE) 5 MG immediate release tablet, Take 1 tablet (5 mg total) by mouth every 4 (four) hours as  needed for severe pain., Disp: 12 tablet, Rfl: 0   polyethylene glycol-electrolytes (  TRILYTE) 420 g solution, Take 4,000 mLs by mouth as directed., Disp: 4000 mL, Rfl: 0   potassium chloride (KLOR-CON M) 10 MEQ tablet, Take 10 mEq by mouth 2 (two) times daily., Disp: , Rfl:    pregabalin (LYRICA) 75 MG capsule, Take 75 mg by mouth in the morning, at noon, in the evening, and at bedtime. , Disp: , Rfl:    PRESCRIPTION MEDICATION, Methodone once daily, Disp: , Rfl:    promethazine (PHENERGAN) 12.5 MG tablet, Take 1 tablet (12.5 mg total) by mouth every 8 (eight) hours as needed for nausea or vomiting., Disp: 30 tablet, Rfl: 2   propranolol (INDERAL) 10 MG tablet, Take 10 mg by mouth 3 (three) times daily., Disp: , Rfl:    QUEtiapine (SEROQUEL) 25 MG tablet, Take 25 mg by mouth at bedtime., Disp: , Rfl:    RESTASIS 0.05 % ophthalmic emulsion, Place 1 drop into both eyes as needed (dry eye). , Disp: , Rfl:    rosuvastatin (CRESTOR) 10 MG tablet, Take 1 tablet (10 mg total) by mouth daily., Disp: 90 tablet, Rfl: 3   spironolactone (ALDACTONE) 100 MG tablet, Take 100 mg by mouth daily. , Disp: , Rfl:    sucralfate (CARAFATE) 1 GM/10ML suspension, Take 10 mLs (1 g total) by mouth 4 (four) times daily., Disp: 420 mL, Rfl: 1   Suvorexant (BELSOMRA) 20 MG TABS, Take 20 mg by mouth at bedtime., Disp: , Rfl:    tirzepatide (MOUNJARO) 7.5 MG/0.5ML Pen, Inject 7.5 mg into the skin once a week., Disp: 6 mL, Rfl: 3   triamcinolone (KENALOG) 0.1 %, Apply 1 application topically daily as needed (irritation)., Disp: , Rfl:    Vitamin D, Ergocalciferol, (DRISDOL) 1.25 MG (50000 UNIT) CAPS capsule, Take 1 capsule (50,000 Units total) by mouth every 7 (seven) days. (Patient taking differently: Take 50,000 Units by mouth every Tuesday.), Disp: 12 capsule, Rfl: 0   Allergies: Allergies  Allergen Reactions   Iodine-131 Anaphylaxis, Shortness Of Breath and Swelling   Ivp Dye [Iodinated Contrast Media] Anaphylaxis     Skin gets very red, unable to walk    Ketorolac Tromethamine Shortness Of Breath   Tylenol [Acetaminophen] Other (See Comments)    Due to cirrhosis    Vancomycin Swelling    Facial swelling,    Gabapentin Itching   Nsaids Other (See Comments)    Flares ulcers   Suboxone [Buprenorphine Hcl-Naloxone Hcl]     Withdrawal symptoms     REVIEW OF SYSTEMS:   Review of Systems  Constitutional:  Positive for appetite change and fatigue. Negative for chills and fever.  HENT:   Negative for lump/mass, mouth sores, nosebleeds, sore throat and trouble swallowing.   Eyes:  Negative for eye problems.  Respiratory:  Positive for shortness of breath (with exertion, chronic). Negative for cough.   Cardiovascular:  Negative for chest pain, leg swelling and palpitations.  Gastrointestinal:  Positive for nausea and vomiting. Negative for abdominal pain, constipation and diarrhea.  Genitourinary:  Negative for bladder incontinence, difficulty urinating, dysuria, frequency, hematuria and nocturia.   Musculoskeletal:  Negative for arthralgias, back pain, flank pain, myalgias and neck pain.  Skin:  Negative for itching and rash.  Neurological:  Positive for numbness. Negative for dizziness and headaches.  Hematological:  Does not bruise/bleed easily.  Psychiatric/Behavioral:  Positive for sleep disturbance. The patient is not nervous/anxious.   All other systems reviewed and are negative.    VITALS:   Blood pressure 117/75, pulse 96, temperature (!) 96  F (35.6 C), temperature source Oral, resp. rate 16, weight 233 lb 9.6 oz (106 kg), SpO2 96 %.  Wt Readings from Last 3 Encounters:  03/11/23 233 lb 9.6 oz (106 kg)  02/11/23 238 lb 9.6 oz (108.2 kg)  02/10/23 236 lb (107 kg)    Body mass index is 34.5 kg/m.   PHYSICAL EXAM:   Physical Exam Vitals and nursing note reviewed. Exam conducted with a chaperone present.  Constitutional:      Appearance: Normal appearance.  Cardiovascular:     Rate  and Rhythm: Normal rate and regular rhythm.     Pulses: Normal pulses.     Heart sounds: Normal heart sounds.  Pulmonary:     Effort: Pulmonary effort is normal.     Breath sounds: Normal breath sounds.  Abdominal:     Palpations: There is no hepatomegaly or splenomegaly.  Musculoskeletal:     Right lower leg: No edema.     Left lower leg: No edema.  Neurological:     General: No focal deficit present.     Mental Status: She is alert and oriented to person, place, and time.  Psychiatric:        Mood and Affect: Mood normal.        Behavior: Behavior normal.     LABS:      Latest Ref Rng & Units 02/11/2023    9:02 AM 01/16/2023    6:21 AM 01/14/2023    9:03 AM  CBC  WBC 4.0 - 10.5 K/uL 3.8  2.4  2.9   Hemoglobin 12.0 - 15.0 g/dL 16.1  09.6  04.5   Hematocrit 36.0 - 46.0 % 40.9  37.0  38.3   Platelets 150 - 400 K/uL 47  50  57       Latest Ref Rng & Units 01/16/2023    6:21 AM 01/07/2023    6:30 AM 01/04/2023    8:33 PM  CMP  Glucose 70 - 99 mg/dL 61  86  409   BUN 6 - 20 mg/dL 6  7  7    Creatinine 0.44 - 1.00 mg/dL 8.11  9.14  7.82   Sodium 135 - 145 mmol/L 142  139  136   Potassium 3.5 - 5.1 mmol/L 3.8  3.8  4.2   Chloride 98 - 111 mmol/L 111  109  110   CO2 22 - 32 mmol/L 23  23  20    Calcium 8.9 - 10.3 mg/dL 8.3  8.1  7.7   Total Protein 6.5 - 8.1 g/dL 5.9  5.8  6.4   Total Bilirubin 0.3 - 1.2 mg/dL 0.8  0.8  1.2   Alkaline Phos 38 - 126 U/L 119  113  121   AST 15 - 41 U/L 40  39  45   ALT 0 - 44 U/L 24  26  30       No results found for: "CEA1", "CEA" / No results found for: "CEA1", "CEA" No results found for: "PSA1" No results found for: "CAN199" No results found for: "CAN125"  Lab Results  Component Value Date   TOTALPROTELP 6.3 01/14/2023   ALBUMINELP 3.1 01/14/2023   A1GS 0.3 01/14/2023   A2GS 0.5 01/14/2023   BETS 1.2 01/14/2023   GAMS 1.2 01/14/2023   MSPIKE Not Observed 01/14/2023   SPEI Comment 01/14/2023   Lab Results  Component Value Date    TIBC 316 02/11/2023   TIBC 309 01/14/2023   FERRITIN 93 02/11/2023   FERRITIN  66 01/14/2023   FERRITIN 63 08/12/2020   IRONPCTSAT 21 02/11/2023   IRONPCTSAT 11 01/14/2023   Lab Results  Component Value Date   LDH 145 01/14/2023   LDH 209 (H) 08/12/2020     STUDIES:   No results found.   ASSESSMENT & PLAN:   Assessment:  1.  Provoked left popliteal DVT: - Reported pain in the left leg, swelling of both legs since around mid March.  Her mobility was limited due to swelling.  No previous history of DVT. - Lower extremity Doppler (02/04/2023): Positive for DVT within the left popliteal vein.  No evidence of right lower extremity DVT. - Denies any miscarriages.  No personal history of APLS. - She had Nexplanon implanted few years ago which is due to be removed in July 2024. - She was started on Lovenox 100 mg twice daily since the diagnosis of the DVT.  She does not report any bleeding issues. 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 - EGD on 01/19/2023: Did not reveal any varices.  2.  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  Had dental extractions 1 and half year ago without any excessive bleeding. - CTAP (01/07/2023): Splenomegaly with length measuring 26.1 cm and splenorenal varices.  Cirrhotic changes in the liver. - She follows with Dr. Levon Hedger for cirrhosis and other GI complaints.  She is not yet on liver transplant list. - Hematology workup (01/14/2023): Negative rheumatoid factor, ANA, lupus anticoagulant. Normal B12, folate, copper, MMA. Immature platelet fraction 7.3%.  Reticulocytes 1.6%.  LDH normal. SPEP normal.  Mildly elevated kappa free light chain 43.6, elevated lambda 34.1.  Normal FLC ratio 1.28.   3.  Iron deficiency without anemia - Labs from 01/14/2023 show normal Hgb 12.5/MCV 91.2,  but with some mild iron deficiency (ferritin 66, iron saturation 11%) - Reports prior history of rectal bleeding, but no active bleeding.  LMP in 2018 (currently has implant) - Colonoscopy (03/31/2022): Preparation of the colon was poor.  Hemorrhoids found on perianal exam. 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  No gastric varices.  5 nonbleeding cratered gastric ulcers with a clear ulcer base in the gastric antrum.   4.  Social/family history: - She is currently on disability.  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  No history of alcohol use.  Current active smoker half pack to 1 pack of cigarettes per day for 5 years. - No family history of low platelets.  Father had melanoma.  No family history of leukemia.  No family history of DVT.   Plan: 1.  Provoked left popliteal DVT: - Labs from 02/11/2023 did not show evidence of antiphospholipid syndrome. Per Dr. Ellin Saba, she may continue limited duration anticoagulation with Lovenox. - At the end of 3 months (around July 2024), we will repeat left leg Doppler.  If the clot is resolved, we may discontinue Lovenox at that time.  2.  Iron deficiency without anemia - She is taking iron tablet daily.  Labs from 02/11/2023 showed ferritin has improved to 93.  3. Thrombocytopenia/Leukopenia: --Labs today show platelet improved to 61K, WBC normal at 5.4.  --No signs of bruising or bleeding --Monitor for now.    I have spent a  total of 25 minutes minutes of face-to-face and non-face-to-face time, preparing to see the patient,  performing a medically appropriate examination, counseling and educating the patient,  documenting clinical information in the electronic health record, and care coordination.   Georga Kaufmann PA-C Dept of Hematology and Oncology [facility]

## 2023-03-16 ENCOUNTER — Encounter (HOSPITAL_COMMUNITY): Payer: Medicaid Other | Admitting: Physical Therapy

## 2023-03-16 DIAGNOSIS — K920 Hematemesis: Secondary | ICD-10-CM | POA: Insufficient documentation

## 2023-03-17 NOTE — Addendum Note (Signed)
Addended by: Dolores Frame on: 03/17/2023 04:59 PM   Modules accepted: Level of Service

## 2023-03-18 ENCOUNTER — Encounter (HOSPITAL_COMMUNITY): Payer: Medicaid Other

## 2023-03-18 ENCOUNTER — Telehealth (INDEPENDENT_AMBULATORY_CARE_PROVIDER_SITE_OTHER): Payer: Self-pay | Admitting: Gastroenterology

## 2023-03-18 NOTE — Telephone Encounter (Signed)
    03/18/23  Charlyn Minerva [birth year]  What type of surgery is being performed? EGD/Colonoscopy  When is surgery scheduled? TBD  Clearance to hold Lovenox  Name of physician performing surgery?  Dr. Katrinka Blazing [facility] Phone: [phone] Fax: [phone]  Anethesia type (none, local, MAC, general)? MAC

## 2023-03-18 NOTE — Telephone Encounter (Signed)
She is not currently a patient of [facility]. If you are seeking medical clearance, we will need to schedule a new patient appointment.  Levi Aland, NP-C  03/18/2023, 12:03 PM [address] Office [phone] Fax [phone]

## 2023-03-20 ENCOUNTER — Other Ambulatory Visit: Payer: Self-pay | Admitting: Nurse Practitioner

## 2023-03-22 ENCOUNTER — Ambulatory Visit (HOSPITAL_COMMUNITY): Payer: Medicaid Other | Attending: Gastroenterology

## 2023-03-23 ENCOUNTER — Encounter (HOSPITAL_COMMUNITY): Payer: Medicaid Other

## 2023-03-24 ENCOUNTER — Ambulatory Visit (INDEPENDENT_AMBULATORY_CARE_PROVIDER_SITE_OTHER): Payer: Medicaid Other | Admitting: Nurse Practitioner

## 2023-03-24 ENCOUNTER — Encounter: Payer: Self-pay | Admitting: Nurse Practitioner

## 2023-03-24 ENCOUNTER — Telehealth: Payer: Self-pay

## 2023-03-24 ENCOUNTER — Telehealth: Payer: Self-pay | Admitting: Nurse Practitioner

## 2023-03-24 ENCOUNTER — Other Ambulatory Visit (HOSPITAL_COMMUNITY): Payer: Self-pay

## 2023-03-24 VITALS — BP 107/64 | HR 76 | Ht 69.0 in | Wt 254.4 lb

## 2023-03-24 DIAGNOSIS — Z7984 Long term (current) use of oral hypoglycemic drugs: Secondary | ICD-10-CM | POA: Diagnosis not present

## 2023-03-24 DIAGNOSIS — E782 Mixed hyperlipidemia: Secondary | ICD-10-CM

## 2023-03-24 DIAGNOSIS — E1165 Type 2 diabetes mellitus with hyperglycemia: Secondary | ICD-10-CM

## 2023-03-24 DIAGNOSIS — Z7985 Long-term (current) use of injectable non-insulin antidiabetic drugs: Secondary | ICD-10-CM

## 2023-03-24 DIAGNOSIS — Z794 Long term (current) use of insulin: Secondary | ICD-10-CM | POA: Diagnosis not present

## 2023-03-24 MED ORDER — DEXCOM G6 TRANSMITTER MISC: 1 refills | Status: DC

## 2023-03-24 MED ORDER — DEXCOM G6 SENSOR MISC: 3 refills | Status: DC

## 2023-03-24 MED ORDER — HUMULIN R U-500 KWIKPEN 500 UNIT/ML ~~LOC~~ SOPN: 90.0000 [IU] | PEN_INJECTOR | Freq: Three times a day (TID) | SUBCUTANEOUS | 3 refills | Status: DC

## 2023-03-24 MED ORDER — MOUNJARO 7.5 MG/0.5ML ~~LOC~~ SOAJ: 7.5000 mg | SUBCUTANEOUS | 0 refills | Status: DC

## 2023-03-24 NOTE — Progress Notes (Signed)
02/24/2023 Name: Jaime [name] MRN: [id number] DOB: 29-Oct-1986   Jaime [name] is a 37 y.o. year old female who presented for a telephone visit.   They were referred to the pharmacist by their PCP for assistance in managing diabetes.   Patient is participating in a Managed Medicaid Plan:  Yes  Subjective:  Care Team: Primary Care Provider: Rica Records, FNP ;Elly Modena, NP; Next Scheduled Visit: 03/24/23  Medication Access/Adherence  Current Pharmacy:  [id number] - [place], [place] - [address]  [place] [address] Phone: [phone] Fax: [phone]  [facility] - [place], [place] - [address] [place] [address] Phone: [phone] Fax: [phone]   Patient reports affordability concerns with their medications: No  Patient reports access/transportation concerns to their pharmacy: Yes  Patient reports adherence concerns with their medications:  Yes  mom handles meds   Diabetes:  Current medications: glipizide, U500 kwikpen, tirzepatide 7.5mg , metformin  Current glucose readings: pt unaware Using dexcom G6 meter; testing continuously   -She could greatly benefit from a continuous glucose monitoring system--G7/libre 3 would be ideal, but pt may be a candidate for pump so would leave on G6 and endocrine to follow  Patient denies hypoglycemic s/sx including dizziness, shakiness, sweating. Patient denies hyperglycemic symptoms including polyuria, polydipsia, polyphagia, nocturia, neuropathy, blurred vision.  Current physical activity: n/a; limited due to comorbidities  Current medication access support: medicaid   Objective:  Lab Results  Component Value Date   HGBA1C 9.3 (H) 01/01/2023    Lab Results  Component Value Date   CREATININE 0.58 01/16/2023   BUN 6 01/16/2023   NA 142 01/16/2023   K 3.8 01/16/2023   CL 111 01/16/2023   CO2 23 01/16/2023    Lab  Results  Component Value Date   CHOL 134 01/01/2023   HDL 35 (L) 01/01/2023   LDLCALC 57 01/01/2023   TRIG 264 (H) 01/01/2023   CHOLHDL 3.8 01/01/2023    Medications Reviewed Today     Reviewed by Joeseph Amor, RN (Registered Nurse) on 03/11/23 at 1321  Med List Status: <None>   Medication Order Taking? Sig Documenting Provider Last Dose Status Informant  Accu-Chek Softclix Lancets lancets [id number] Yes USE TO TEST BLOOD SUGAR 4 TIMES A DAY. Dani Gobble, NP Taking Active Self  albuterol (VENTOLIN HFA) 108 (90 Base) MCG/ACT inhaler [id number] Yes Inhale 2 puffs into the lungs every 6 (six) hours as needed for wheezing or shortness of breath. Leath-Warren, Sadie Haber, NP Taking Active Self  ARIPiprazole (ABILIFY) 20 MG tablet [id number] Yes Take 20 mg by mouth daily. [provider] Taking Active Self  aspirin EC 81 MG tablet [id number] Yes Take 81 mg by mouth daily. Swallow whole. [provider] Taking Active Self  cetirizine (ZYRTEC ALLERGY) 10 MG tablet [id number] Yes Take 1 tablet (10 mg total) by mouth daily. Durward Parcel, FNP Taking Active Self           Med Note Alphonzo Dublin   Fri Nov 21, 2021 10:09 AM)    clonazePAM (KLONOPIN) 0.5 MG tablet [id number] Yes Take 0.5 mg by mouth 2 (two) times daily. [provider] Taking Active Self  Continuous Blood Gluc Sensor (DEXCOM G6 SENSOR) MISC [id number] Yes Change sensor every 10 days. Dani Gobble, NP Taking Active Self  Continuous Blood Gluc Transmit (DEXCOM G6 TRANSMITTER) MISC [id number] Yes USE AS DIRECTED. Ronny Bacon  J, NP Taking Active Self  cyclobenzaprine (FLEXERIL) 10 MG tablet [id number] Yes Take 1 tablet (10 mg total) by mouth 3 (three) times daily as needed for muscle spasms. Del Newman Nip, Tenna Child, FNP Taking Active   DULoxetine (CYMBALTA) 30 MG capsule [id number] Yes Take 1 capsule (30 mg total) by mouth daily. Gardenia Phlegm, MD Taking Active   enoxaparin (LOVENOX) 100 MG/ML  injection [id number] Yes Inject 1 mL (100 mg total) into the skin every 12 (twelve) hours. Doreatha Massed, MD Taking Active   etonogestrel (NEXPLANON) 68 MG IMPL implant [id number] Yes 1 each by Subdermal route once. [provider] Taking Active Self           Med Note Altamese Cabal Mar 23, 2022 12:08 PM)    famotidine (PEPCID) 20 MG tablet [id number] Yes Take 1 tablet (20 mg total) by mouth 2 (two) times daily. Rondel Baton, MD Taking Active   ferrous sulfate 325 (65 FE) MG EC tablet [id number] Yes Take 1 tablet (325 mg total) by mouth daily with breakfast. Rojelio Brenner M, PA-C Taking Active   furosemide (LASIX) 40 MG tablet [id number] Yes Take 40 mg by mouth daily. [provider] Taking Active Self           Med Note Hart Rochester, Lafe Garin   Fri Nov 21, 2021 10:28 AM)    glipiZIDE (GLUCOTROL XL) 5 MG 24 hr tablet [id number] Yes Take 1 tablet (5 mg total) by mouth daily with breakfast. Dani Gobble, NP Taking Active Self  glucose blood (ACCU-CHEK GUIDE) test strip [id number] Yes Use as instructed to check blood glucose four times daily Dani Gobble, NP Taking Active Self  hyoscyamine (ANASPAZ) 0.125 MG TBDP disintergrating tablet [id number] Yes Place 0.125 mg under the tongue. One tid prn [provider] Taking Active   Insulin Pen Needle (GLOBAL EASE INJECT PEN NEEDLES) 31G X 8 MM MISC [id number] Yes Use 1 pen needle 4 times daily for insulin. Dani Gobble, NP Taking Active Self  Insulin Pen Needle (PEN NEEDLES) 32G X 6 MM MISC [id number] Yes 1 each by Does not apply route 3 (three) times daily. Dani Gobble, NP Taking Active Self  insulin regular human CONCENTRATED (HUMULIN R U-500 KWIKPEN) 500 UNIT/ML KwikPen [id number] Yes Inject 110 Units into the skin 3 (three) times daily with meals. Dani Gobble, NP Taking Active Self  lidocaine (XYLOCAINE) 5 % ointment [id number] Yes Apply 1 Application topically as needed. Doreatha Massed, MD Taking Active   LINZESS 290 MCG CAPS capsule [id number] Yes Take 290 mcg by mouth daily. [provider] Taking Active Self  lisinopril (ZESTRIL) 5 MG tablet [id number] Yes TAKE 1 TABLET ONCE DAILY. Dani Gobble, NP Taking Active Self  lubiprostone (AMITIZA) 24 MCG capsule [id number] Yes Take 1 capsule (24 mcg total) by mouth 2 (two) times daily with a meal. Marguerita Merles, Reuel Boom, MD Taking Active Self  metFORMIN (GLUCOPHAGE) 1000 MG tablet [id number] Yes Take 1 tablet (1,000 mg total) by mouth 2 (two) times daily with a meal. Dani Gobble, NP Taking Active Self  methocarbamol (ROBAXIN) 500 MG tablet [id number] Yes Take 500 mg by mouth at bedtime. [provider] Taking Active Self  Multiple Vitamin (MULTIVITAMIN) tablet [id number] Yes Take 1 tablet by mouth daily. [provider] Taking Active   nystatin (MYCOSTATIN/NYSTOP) powder [id number] Yes Apply 1 application topically 3 (three) times daily. Mesner, Barbara Cower, MD Taking Active Self  omeprazole (PRILOSEC) 40 MG capsule  [id number] Yes Take 1 capsule (40 mg total) by mouth 2 (two) times daily. Raquel James, NP Taking Active   ondansetron (ZOFRAN) 4 MG tablet [id number] Yes Take 1 tablet (4 mg total) by mouth every 8 (eight) hours as needed for nausea or vomiting. Raquel James, NP Taking Active   oxyCODONE (ROXICODONE) 5 MG immediate release tablet [id number] Yes Take 1 tablet (5 mg total) by mouth every 4 (four) hours as needed for severe pain. Rondel Baton, MD Taking Active   polyethylene glycol-electrolytes (TRILYTE) 420 g solution [id number] Yes Take 4,000 mLs by mouth as directed. Dolores Frame, MD Taking Active Self  potassium chloride (KLOR-CON M) 10 MEQ tablet [id number] Yes Take 10 mEq by mouth 2 (two) times daily. [provider] Taking Active Self  pregabalin (LYRICA) 75 MG capsule [id number] Yes Take 75 mg by mouth in the morning, at noon, in the evening, and at  bedtime.  [provider] Taking Active Self           Med Note Teresa Pelton Nov 21, 2021 10:31 AM)    PRESCRIPTION MEDICATION [id number] Yes Methodone once daily [provider] Taking Active   promethazine (PHENERGAN) 12.5 MG tablet [id number] Yes Take 1 tablet (12.5 mg total) by mouth every 8 (eight) hours as needed for nausea or vomiting. Dolores Frame, MD Taking Active Self  propranolol (INDERAL) 10 MG tablet [id number] Yes Take 10 mg by mouth 3 (three) times daily. [provider] Taking Active Self           Med Note Alphonzo Dublin   Fri Nov 21, 2021 10:32 AM)    QUEtiapine (SEROQUEL) 25 MG tablet [id number] Yes Take 25 mg by mouth at bedtime. [provider] Taking Active   RESTASIS 0.05 % ophthalmic emulsion [id number] Yes Place 1 drop into both eyes as needed (dry eye).  [provider] Taking Active Self  rosuvastatin (CRESTOR) 10 MG tablet [id number] Yes Take 1 tablet (10 mg total) by mouth daily. Del Nigel Berthold, FNP Taking Active            Med Note Peggye Pitt, [facility] M   Mon Mar 08, 2023  1:58 PM) 5mg   spironolactone (ALDACTONE) 100 MG tablet [id number] Yes Take 100 mg by mouth daily.  [provider] Taking Active Self           Med Note Hart Rochester, Lafe Garin   Fri Nov 21, 2021 10:33 AM)    sucralfate (CARAFATE) 1 GM/10ML suspension [id number] Yes Take 10 mLs (1 g total) by mouth 4 (four) times daily. Carlan, Chelsea L, NP Taking Active   Suvorexant (BELSOMRA) 20 MG TABS [id number] Yes Take 20 mg by mouth at bedtime. [provider] Taking Active Self  tirzepatide Greggory Keen) 7.5 MG/0.5ML Pen [id number] Yes Inject 7.5 mg into the skin once a week. Dani Gobble, NP Taking Active   triamcinolone (KENALOG) 0.1 % [id number] Yes Apply 1 application topically daily as needed (irritation). [provider] Taking Active Self  Vitamin D, Ergocalciferol, (DRISDOL) 1.25 MG (50000 UNIT) CAPS capsule  [id number] Yes Take 1 capsule (50,000 Units total) by mouth every 7 (seven) days.  Patient taking differently: Take 50,000 Units by mouth every Tuesday.   Dani Gobble, NP Taking Active Self  Med List Note Wynelle Link, CPhT 07/30/20 1049): [facility] [phone]              Assessment/Plan:   Patient  unaware of medications at this time.  She states her mother manages them, but she is not with her at this time.  Requested call back from mom Jasmine December [phone]).  Follow up appt planned for 1 month with PharmD.  Patient to see endo on 03/24/23.  I would consider d/c on glipizide as it is likely not effective at this time.   Follow Up Plan: 1 month  Kieth Brightly, PharmD, BCACP Clinical Pharmacist, [facility]

## 2023-03-24 NOTE — Telephone Encounter (Signed)
Patient Advocate Encounter   Received notification from pt msgs that prior authorization is required for [facility] 7.5MG /0.5ML pen-injectors  Submitted: 03/24/2023 Key [id number]  Status is pending

## 2023-03-24 NOTE — Telephone Encounter (Signed)
During visit today, Jaime [name] shares that she has not been able to get her [facility] for 2-3 weeks now either due to drug shortage or now because a PA is needed.  Sent PA team a message to follow up on PA status.

## 2023-03-24 NOTE — Progress Notes (Signed)
03/24/2023, 9:38 AM     Endocrinology Follow Up Visit  Subjective:    Patient ID: Jaime [name], female    DOB: [birth year].  Jaime [name] is being seen in follow up for management of currently uncontrolled symptomatic diabetes requested by  Rica Records, FNP.   Past Medical History:  Diagnosis Date   Acute encephalopathy 08/31/2020   Adjustment disorder with depressed mood 09/07/2020   Anxiety    Asthma    Chronic abdominal pain    Chronic back pain    Chronic prescription opiate use 02/16/2022   Cirrhosis (HCC)    Cirrhosis (HCC)    COPD (chronic obstructive pulmonary disease) (HCC)    Depression    Diabetes mellitus without complication (HCC)    Diabetes mellitus, type II (HCC)    Diverticulosis    Fatty liver 03/07/2020   GERD (gastroesophageal reflux disease)    Hepatitis C    Hernia, abdominal    Hyperglycemia due to type 2 diabetes mellitus (HCC) 05/24/2020   Hyperlipidemia    IBS (irritable bowel syndrome) 02/16/2022   Insomnia    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)    Lupus (HCC)    Migraine headache    Morbid obesity (HCC) 05/24/2020   Neuropathy    Nocturnal seizures (HCC)    Pain of upper abdomen 07/28/2021   Peptic ulcer    Spleen enlarged    Splenomegaly     Past Surgical History:  Procedure Laterality Date   BIOPSY  11/25/2021   Procedure: BIOPSY;  Surgeon: Dolores Frame, MD;  [facility];  Service: Gastroenterology;;   CHOLECYSTECTOMY     COLONOSCOPY WITH PROPOFOL N/A 11/25/2021   Procedure: COLONOSCOPY WITH PROPOFOL;  Surgeon: Dolores Frame, MD;  Location: [facility];  Service: Gastroenterology;  Laterality: N/A;  805   COLONOSCOPY WITH PROPOFOL N/A 03/31/2022   Procedure: COLONOSCOPY WITH PROPOFOL;  Surgeon: Dolores Frame, MD;  [facility]: [facility];  Service:  Gastroenterology;  Laterality: N/A;  730   ESOPHAGOGASTRODUODENOSCOPY  06/2020   done at [facility], candida in upper esophagus (treated with diflucan), ulcerative esophagitis at GE junction, gastritis in stomach, single ulcer in duodenal bulb, with duodenal mucosa showing no abnormality. No presence of varices   ESOPHAGOGASTRODUODENOSCOPY (EGD) WITH PROPOFOL N/A 11/25/2021   Procedure: ESOPHAGOGASTRODUODENOSCOPY (EGD) WITH PROPOFOL;  Surgeon: Dolores Frame, MD;  [facility];  Service: Gastroenterology;  Laterality: N/A;   ESOPHAGOGASTRODUODENOSCOPY (EGD) WITH PROPOFOL N/A 03/31/2022   Procedure: ESOPHAGOGASTRODUODENOSCOPY (EGD) WITH PROPOFOL;  Surgeon: Dolores Frame, MD;  [facility];  Service: Gastroenterology;  Laterality: N/A;    Social History   Socioeconomic History   Marital status: Single    Spouse name: Not on file   Number of children: Not on file   Years of education: Not on file   Highest education level: Not on file  Occupational History   Not on file  Tobacco Use   Smoking status: Every Day    Packs/day: .5    Types: Cigarettes    Passive exposure: Current  Smokeless tobacco: Never  Vaping Use   Vaping Use: Former   Substances: Nicotine, Flavoring  Substance and Sexual Activity   Alcohol use: No   Drug use: No   Sexual activity: Not Currently    Birth control/protection: Implant  Other Topics Concern   Not on file  Social History Narrative   Not on file   Social Determinants of Health   Financial Resource Strain: Medium Risk (05/09/2020)   Overall Financial Resource Strain (CARDIA)    Difficulty of Paying Living Expenses: Somewhat hard  Food Insecurity: Food Insecurity Present (01/14/2023)   Hunger Vital Sign    Worried About Running Out of Food in the Last Year: Sometimes true    Ran Out of Food in the Last Year: Never true  Transportation Needs: No Transportation Needs (01/14/2023)   PRAPARE - Therapist, art (Medical): No    Lack of Transportation (Non-Medical): No  Physical Activity: Insufficiently Active (05/09/2020)   Exercise Vital Sign    Days of Exercise per Week: 1 day    Minutes of Exercise per Session: 20 min  Stress: Stress Concern Present (05/09/2020)   [employer] of Occupational Health - Occupational Stress Questionnaire    Feeling of Stress : Very much  Social Connections: Moderately Isolated (05/09/2020)   Social Connection and Isolation Panel [NHANES]    Frequency of Communication with Friends and Family: More than three times a week    Frequency of Social Gatherings with Friends and Family: Twice a week    Attends Religious Services: More than 4 times per year    Active Member of [employer] or Organizations: No    Attends Banker Meetings: Never    Marital Status: Never married    Family History  Problem Relation Age of Onset   Hyperlipidemia Maternal Grandfather     Outpatient Encounter Medications as of 03/24/2023  Medication Sig   Accu-Chek Softclix Lancets lancets USE TO TEST BLOOD SUGAR 4 TIMES A DAY.   albuterol (VENTOLIN HFA) 108 (90 Base) MCG/ACT inhaler Inhale 2 puffs into the lungs every 6 (six) hours as needed for wheezing or shortness of breath.   ARIPiprazole (ABILIFY) 20 MG tablet Take 20 mg by mouth daily.   aspirin EC 81 MG tablet Take 81 mg by mouth daily. Swallow whole.   cetirizine (ZYRTEC ALLERGY) 10 MG tablet Take 1 tablet (10 mg total) by mouth daily.   clonazePAM (KLONOPIN) 0.5 MG tablet Take 0.5 mg by mouth 2 (two) times daily.   cyclobenzaprine (FLEXERIL) 10 MG tablet Take 1 tablet (10 mg total) by mouth 3 (three) times daily as needed for muscle spasms.   DULoxetine (CYMBALTA) 30 MG capsule Take 1 capsule (30 mg total) by mouth daily.   enoxaparin (LOVENOX) 100 MG/ML injection Inject 1 mL (100 mg total) into the skin every 12 (twelve) hours.   etonogestrel (NEXPLANON) 68 MG IMPL implant 1 each by Subdermal route  once.   famotidine (PEPCID) 20 MG tablet Take 1 tablet (20 mg total) by mouth 2 (two) times daily.   ferrous sulfate 325 (65 FE) MG EC tablet Take 1 tablet (325 mg total) by mouth daily with breakfast.   furosemide (LASIX) 40 MG tablet Take 40 mg by mouth daily.   glipiZIDE (GLUCOTROL XL) 5 MG 24 hr tablet Take 1 tablet (5 mg total) by mouth daily with breakfast.   glucose blood (ACCU-CHEK GUIDE) test strip Use as instructed to check blood glucose four times daily  hyoscyamine (ANASPAZ) 0.125 MG TBDP disintergrating tablet Place 0.125 mg under the tongue. One tid prn   Insulin Pen Needle (GLOBAL EASE INJECT PEN NEEDLES) 31G X 8 MM MISC Use 1 pen needle 4 times daily for insulin.   Insulin Pen Needle (PEN NEEDLES) 32G X 6 MM MISC 1 each by Does not apply route 3 (three) times daily.   lidocaine (XYLOCAINE) 5 % ointment Apply 1 Application topically as needed.   LINZESS 290 MCG CAPS capsule Take 290 mcg by mouth daily.   lisinopril (ZESTRIL) 5 MG tablet TAKE 1 TABLET ONCE DAILY.   lubiprostone (AMITIZA) 24 MCG capsule Take 1 capsule (24 mcg total) by mouth 2 (two) times daily with a meal.   metFORMIN (GLUCOPHAGE) 1000 MG tablet Take 1 tablet (1,000 mg total) by mouth 2 (two) times daily with a meal.   methocarbamol (ROBAXIN) 500 MG tablet Take 500 mg by mouth at bedtime.   Multiple Vitamin (MULTIVITAMIN) tablet Take 1 tablet by mouth daily.   nystatin (MYCOSTATIN/NYSTOP) powder Apply 1 application topically 3 (three) times daily.   omeprazole (PRILOSEC) 40 MG capsule Take 1 capsule (40 mg total) by mouth 2 (two) times daily.   ondansetron (ZOFRAN) 4 MG tablet Take 1 tablet (4 mg total) by mouth every 8 (eight) hours as needed for nausea or vomiting.   oxyCODONE (ROXICODONE) 5 MG immediate release tablet Take 1 tablet (5 mg total) by mouth every 4 (four) hours as needed for severe pain.   polyethylene glycol-electrolytes (TRILYTE) 420 g solution Take 4,000 mLs by mouth as directed.   potassium  chloride (KLOR-CON M) 10 MEQ tablet Take 10 mEq by mouth 2 (two) times daily.   pregabalin (LYRICA) 75 MG capsule Take 75 mg by mouth in the morning, at noon, in the evening, and at bedtime.    PRESCRIPTION MEDICATION Methodone once daily   promethazine (PHENERGAN) 12.5 MG tablet Take 1 tablet (12.5 mg total) by mouth every 8 (eight) hours as needed for nausea or vomiting.   propranolol (INDERAL) 10 MG tablet Take 10 mg by mouth 3 (three) times daily.   QUEtiapine (SEROQUEL) 25 MG tablet Take 25 mg by mouth at bedtime.   RESTASIS 0.05 % ophthalmic emulsion Place 1 drop into both eyes as needed (dry eye).    rosuvastatin (CRESTOR) 10 MG tablet Take 1 tablet (10 mg total) by mouth daily.   spironolactone (ALDACTONE) 100 MG tablet Take 100 mg by mouth daily.    sucralfate (CARAFATE) 1 GM/10ML suspension Take 10 mLs (1 g total) by mouth 4 (four) times daily.   Suvorexant (BELSOMRA) 20 MG TABS Take 20 mg by mouth at bedtime.   triamcinolone (KENALOG) 0.1 % Apply 1 application topically daily as needed (irritation).   Vitamin D, Ergocalciferol, (DRISDOL) 1.25 MG (50000 UNIT) CAPS capsule Take 1 capsule (50,000 Units total) by mouth every 7 (seven) days. (Patient taking differently: Take 50,000 Units by mouth every Tuesday.)   [DISCONTINUED] Continuous Blood Gluc Sensor (DEXCOM G6 SENSOR) MISC Change sensor every 10 days.   [DISCONTINUED] Continuous Blood Gluc Transmit (DEXCOM G6 TRANSMITTER) MISC USE AS DIRECTED.   [DISCONTINUED] insulin regular human CONCENTRATED (HUMULIN R U-500 KWIKPEN) 500 UNIT/ML KwikPen Inject 110 Units into the skin 3 (three) times daily with meals.   Continuous Glucose Sensor (DEXCOM G6 SENSOR) MISC Change sensor every 10 days.   Continuous Glucose Transmitter (DEXCOM G6 TRANSMITTER) MISC USE AS DIRECTED.   insulin regular human CONCENTRATED (HUMULIN R U-500 KWIKPEN) 500 UNIT/ML KwikPen Inject 90  Units into the skin 3 (three) times daily with meals.   tirzepatide (MOUNJARO) 7.5  MG/0.5ML Pen Inject 7.5 mg into the skin once a week.   [DISCONTINUED] tirzepatide (MOUNJARO) 7.5 MG/0.5ML Pen inject 7.5 MILLIGRAM into the skin once a week. (Patient not taking: Reported on 03/24/2023)   [DISCONTINUED] tirzepatide (MOUNJARO) 7.5 MG/0.5ML Pen Inject 7.5 mg into the skin once a week.   No facility-administered encounter medications on file as of 03/24/2023.    ALLERGIES: Allergies  Allergen Reactions   Iodine-131 Anaphylaxis, Shortness Of Breath and Swelling   Ivp Dye [Iodinated Contrast Media] Anaphylaxis    Skin gets very red, unable to walk    Ketorolac Tromethamine Shortness Of Breath   Tylenol [Acetaminophen] Other (See Comments)    Due to cirrhosis    Vancomycin Swelling    Facial swelling,    Gabapentin Itching   Nsaids Other (See Comments)    Flares ulcers   Suboxone [Buprenorphine Hcl-Naloxone Hcl]     Withdrawal symptoms     VACCINATION STATUS: Immunization History  Administered Date(s) Administered   Influenza Inj Mdck Quad Pf 08/19/2022   Influenza,inj,Quad PF,6+ Mos 07/18/2016, 07/07/2018, 07/04/2019, 07/27/2020   Influenza-Unspecified 08/11/2017, 07/19/2018   Moderna SARS-COV2 Booster Vaccination 04/09/2022   Moderna Sars-Covid-2 Vaccination 03/07/2020, 04/04/2020   Pneumococcal Polysaccharide-23 07/18/2016, 07/07/2018, 07/04/2019, 08/01/2020   Tdap 07/04/2019    Diabetes She presents for her follow-up diabetic visit. She has type 2 diabetes mellitus. Onset time: She was diagnosed at approximate age of 28 years. Her disease course has been improving. There are no hypoglycemic associated symptoms. Pertinent negatives for hypoglycemia include no headaches or pallor. (Had symptoms of hypoglycemia when glucose dropped to 117.) Associated symptoms include foot paresthesias and weight loss. Pertinent negatives for diabetes include no blurred vision, no chest pain, no fatigue, no polydipsia, no polyphagia and no polyuria. There are no hypoglycemic  complications. (History of unresponsiveness requiring EMS and hospitalization- none recent) Symptoms are improving. Diabetic complications include heart disease and peripheral neuropathy. Risk factors for coronary artery disease include diabetes mellitus, obesity, sedentary lifestyle, dyslipidemia and hypertension. Current diabetic treatment includes oral agent (dual therapy) and intensive insulin program (and Mounjaro). She is compliant with treatment most of the time (t). Her weight is decreasing steadily. She is following a low salt and generally healthy diet. When asked about meal planning, she reported none. She has had a previous visit with a dietitian. She participates in exercise intermittently. Her home blood glucose trend is decreasing steadily. Her overall blood glucose range is 140-180 mg/dl. (She presents today, accompanied by her mother, with her CGM showing greatly improved glycemic profile overall.  Her most recent A1c on 8% on 3/17.  She was not due for another A1c today.  She has completely cut out sweet tea.  She denies any recent hypoglycemia.  She does note she has not been able to get [facility] due to drug shortage, has been out for 2-3 weeks.) An ACE inhibitor/angiotensin II receptor blocker is being taken. She does not see a podiatrist.Eye exam is not current.  Hypertension This is a chronic problem. The current episode started more than 1 month ago. The problem has been resolved since onset. The problem is controlled. Associated symptoms include peripheral edema. Pertinent negatives include no blurred vision, chest pain, headaches, palpitations or shortness of breath. There are no associated agents to hypertension. Risk factors for coronary artery disease include diabetes mellitus, dyslipidemia, obesity and sedentary lifestyle. Past treatments include diuretics and beta blockers. The  current treatment provides no improvement. There are no compliance problems.     Review of  systems  Constitutional: + fluctuating body weight,  current Body mass index is 37.57 kg/m. , + fatigue, no subjective hyperthermia, no subjective hypothermia Eyes: no blurry vision, no xerophthalmia ENT: no sore throat, no nodules palpated in throat, no dysphagia/odynophagia, no hoarseness Cardiovascular: no chest pain, no shortness of breath, no palpitations, no leg swelling Respiratory: no cough, no shortness of breath Gastrointestinal: no nausea/vomiting/diarrhea Musculoskeletal: no muscle/joint aches Skin: no rashes, no hyperemia Neurological: no tremors, no numbness, no tingling, no dizziness Psychiatric: + depression-greatly improved, no anxiety  Objective:     BP 107/64 (BP Location: Right Arm, Patient Position: Sitting, Cuff Size: Large)   Pulse 76   Ht 5\' 9"  (1.753 m)   Wt 254 lb 6.4 oz (115.4 kg)   BMI 37.57 kg/m   Wt Readings from Last 3 Encounters:  03/24/23 254 lb 6.4 oz (115.4 kg)  03/11/23 233 lb 9.6 oz (106 kg)  02/11/23 238 lb 9.6 oz (108.2 kg)    BP Readings from Last 3 Encounters:  03/24/23 107/64  03/11/23 117/75  03/08/23 110/72     Physical Exam- Limited  Constitutional:  Body mass index is 37.57 kg/m. , not in acute distress, normal state of mind Eyes:  EOMI, no exophthalmos Musculoskeletal: no gross deformities, strength intact in all four extremities, no gross restriction of joint movements Skin:  no rashes, no hyperemia Neurological: no tremor with outstretched hands   Diabetic Foot Exam - Simple   No data filed    CMP ( most recent) CMP     Component Value Date/Time   NA 142 01/16/2023 0621   NA 143 01/01/2023 1408   K 3.8 01/16/2023 0621   CL 111 01/16/2023 0621   CO2 23 01/16/2023 0621   GLUCOSE 61 (L) 01/16/2023 0621   BUN 6 01/16/2023 0621   BUN 11 01/01/2023 1408   CREATININE 0.58 01/16/2023 0621   CREATININE 0.66 05/18/2022 1113   CALCIUM 8.3 (L) 01/16/2023 0621   PROT 5.9 (L) 01/16/2023 0621   PROT 5.8 (L) 01/01/2023  1408   ALBUMIN 2.7 (L) 01/16/2023 0621   ALBUMIN 3.3 (L) 01/01/2023 1408   AST 40 01/16/2023 0621   ALT 24 01/16/2023 0621   ALKPHOS 119 01/16/2023 0621   BILITOT 0.8 01/16/2023 0621   BILITOT 1.3 (H) 01/01/2023 1408   GFRNONAA >60 01/16/2023 0621   GFRAA >60 08/01/2020 0704     Diabetic Labs (most recent): Lab Results  Component Value Date   HGBA1C 9.3 (H) 01/01/2023   HGBA1C 11.1 (A) 09/29/2022   HGBA1C 10.5 05/19/2022   MICROALBUR 30 12/10/2021   MICROALBUR 80 02/26/2021     Lipid Panel ( most recent) Lipid Panel     Component Value Date/Time   CHOL 134 01/01/2023 1408   TRIG 264 (H) 01/01/2023 1408   HDL 35 (L) 01/01/2023 1408   CHOLHDL 3.8 01/01/2023 1408   LDLCALC 57 01/01/2023 1408   LABVLDL 42 (H) 01/01/2023 1408      Lab Results  Component Value Date   TSH 2.280 01/01/2023   TSH 2.700 12/03/2021   TSH 0.635 09/08/2020   TSH 0.993 07/30/2020   TSH 1.990 07/23/2020   TSH 1.690 08/22/2010   FREET4 0.97 01/01/2023   FREET4 0.70 (L) 12/03/2021   FREET4 0.72 (L) 07/23/2020   FREET4 1.11 08/22/2010      Assessment & Plan:   1) Type 2 diabetes  mellitus with hyperglycemia, without long-term current use of insulin (HCC)  - Jaime [name] has currently uncontrolled symptomatic type 2 DM since  37 years of age.  She presents today, accompanied by her mother, with her CGM showing greatly improved glycemic profile overall.  Her most recent A1c on 8% on 3/17.  She was not due for another A1c today.  She has completely cut out sweet tea.  She denies any recent hypoglycemia.  She does note she has not been able to get [facility] due to drug shortage, has been out for 2-3 weeks.  - I had a long discussion with her about the progressive nature of diabetes and the pathology behind its complications.  -her diabetes is complicated by obesity/sedentary life and she remains at a high risk for more acute and chronic complications which include CAD, CVA, CKD, retinopathy,  and neuropathy. These are all discussed in detail with her.  - Nutritional counseling repeated at each appointment due to patients tendency to fall back in to old habits.  - The patient admits there is a room for improvement in their diet and drink choices. -  Suggestion is made for the patient to avoid simple carbohydrates from their diet including Cakes, Sweet Desserts / Pastries, Ice Cream, Soda (diet and regular), Sweet Tea, Candies, Chips, Cookies, Sweet Pastries, Store Bought Juices, Alcohol in Excess of 1-2 drinks a day, Artificial Sweeteners, Coffee Creamer, and "Sugar-free" Products. This will help patient to have stable blood glucose profile and potentially avoid unintended weight gain.   - I encouraged the patient to switch to unprocessed or minimally processed complex starch and increased protein intake (animal or plant source), fruits, and vegetables.   - Patient is advised to stick to a routine mealtimes to eat 3 meals a day and avoid unnecessary snacks (to snack only to correct hypoglycemia).  - I have approached her with the following individualized plan to manage  her diabetes and patient agrees:   -She is advised to continue her U500 90 units TID with meals if glucose is above 90 and she is eating but to be more consistent with taking it.  She can continue her Mounjaro 7.5 mg SQ weekly, Metformin 1000 mg po twice daily with meals, and Glipizide 5 mg XL daily with breakfast.   -She is encouraged to continue to monitor glucose 4 times daily (using her CGM), before meals and before bed and log on the clinic sheets provided.  They are instructed to call the clinic if she has readings less than 70 or greater than 300 for 3 tests in a row.     - Specific targets for  A1c;  LDL, HDL,  and Triglycerides were discussed with the patient.  2) Blood Pressure /Hypertension: Her blood pressure is controlled to target.  She is advised to continue Lasix 80 mg po daily, continue Lisinopril 5 mg  po daily, continue Propanolol 10 mg po TID, and Aldactone 100 mg o daily  3) Lipids/Hyperlipidemia:   Review of her recent lipid profile from 01/01/23 shows controlled LDL at 57 and elevated triglycerides of 264.  She is advised to continue Crestor 10 mg po daily at bedtime.  Side effects and precautions discussed with her.    4)  Weight/Diet:  Her Body mass index is 37.57 kg/m.  -   clearly complicating her diabetes care.   she is  a candidate for modest weight loss. I discussed with her the fact that loss of 5 - 10% of  her  current body weight will have the most impact on her diabetes management.  Exercise, and detailed carbohydrates information provided  -  detailed on discharge instructions.  5) Vitamin D deficiency Her most recent vitamin d level on 12/03/21 was 9.8.  I discussed and initiated replenishment with Ergocalciferol 50000 units po weekly- she is still taking this.  6) Chronic Care/Health Maintenance: -she is on ACE and statin medications and is encouraged to initiate and continue to follow up with Ophthalmology, Dentist,  Podiatrist at least yearly or according to recommendations, and advised to stay away from smoking. I have recommended yearly flu vaccine and pneumonia vaccine at least every 5 years; moderate intensity exercise for up to 150 minutes weekly; and  sleep for at least 7 hours a day.  - she is advised to maintain close follow up with Del Nigel Berthold, FNP for primary care needs, as well as her other providers for optimal and coordinated care.     I spent  33  minutes in the care of the patient today including review of labs from CMP, Lipids, Thyroid Function, Hematology (current and previous including abstractions from other facilities); face-to-face time discussing  her blood glucose readings/logs, discussing hypoglycemia and hyperglycemia episodes and symptoms, medications doses, her options of short and long term treatment based on the latest standards of care  / guidelines;  discussion about incorporating lifestyle medicine;  and documenting the encounter. Risk reduction counseling performed per USPSTF guidelines to reduce obesity and cardiovascular risk factors.     Please refer to Patient Instructions for Blood Glucose Monitoring and Insulin/Medications Dosing Guide"  in media tab for additional information. Please  also refer to " Patient Self Inventory" in the Media  tab for reviewed elements of pertinent patient history.  Charlyn Minerva participated in the discussions, expressed understanding, and voiced agreement with the above plans.  All questions were answered to her satisfaction. she is encouraged to contact clinic should she have any questions or concerns prior to her return visit.    Follow up plan: - Return in about 4 months (around 07/25/2023) for Diabetes F/U with A1c in office, Bring meter and logs, No previsit labs.    Ronny Bacon, Pr[facility] [address] [place], [place] [address] Phone: [phone] Fax: [phone]   03/24/2023, 9:38 AM

## 2023-03-25 ENCOUNTER — Ambulatory Visit: Payer: Medicaid Other | Admitting: Orthopaedic Surgery

## 2023-03-25 ENCOUNTER — Encounter (HOSPITAL_COMMUNITY): Payer: Medicaid Other | Admitting: Physical Therapy

## 2023-03-30 ENCOUNTER — Encounter (HOSPITAL_COMMUNITY): Payer: Medicaid Other

## 2023-03-31 ENCOUNTER — Encounter (HOSPITAL_COMMUNITY): Payer: Medicaid Other

## 2023-03-31 NOTE — Telephone Encounter (Signed)
PA has been APPROVED from 03/24/2023-03/23/2024  Approval letter has been attached in patients media.

## 2023-04-01 ENCOUNTER — Telehealth (INDEPENDENT_AMBULATORY_CARE_PROVIDER_SITE_OTHER): Payer: Self-pay | Admitting: Gastroenterology

## 2023-04-01 ENCOUNTER — Encounter (HOSPITAL_COMMUNITY): Payer: Medicaid Other

## 2023-04-01 NOTE — Telephone Encounter (Signed)
Joeseph Amor, RN  Dolores Frame, MD; Marlowe Shores, LPN Per Dr. Ellin Saba, she can hold one dose of Lovenox and resume after procedure.

## 2023-04-01 NOTE — Telephone Encounter (Signed)
Pt needing TCS and EGD +/- ED.ASA 3 (Rectal bleeding;N/V) Per Dr.K nurse  Joeseph Amor, RN  Dolores Frame, MD; Marlowe Shores, LPN Per Dr. Ellin Saba, she can hold one dose of Lovenox and resume after procedure.  Left message to return call

## 2023-04-05 ENCOUNTER — Encounter (INDEPENDENT_AMBULATORY_CARE_PROVIDER_SITE_OTHER): Payer: Self-pay

## 2023-04-05 MED ORDER — PEG 3350-KCL-NA BICARB-NACL 420 G PO SOLR: 4000.0000 mL | ORAL | 0 refills | Status: DC

## 2023-04-05 NOTE — Telephone Encounter (Signed)
Mychart message sent to patient.

## 2023-04-05 NOTE — Telephone Encounter (Signed)
Pt returned call. TCS/EGD+/- ED scheduled for 06/01/23 at 7:30am. Prep sent to [facility] [place]. Instructions sent via my chart.

## 2023-04-05 NOTE — Telephone Encounter (Signed)
Pa form sent to [facility]

## 2023-04-05 NOTE — Addendum Note (Signed)
Addended by: Marlowe Shores on: 04/05/2023 11:37 AM   Modules accepted: Orders

## 2023-04-07 ENCOUNTER — Ambulatory Visit: Payer: Medicaid Other | Admitting: Orthopaedic Surgery

## 2023-04-07 ENCOUNTER — Other Ambulatory Visit: Payer: Self-pay | Admitting: Pharmacist

## 2023-04-08 NOTE — Telephone Encounter (Signed)
Fax from [employer] approving EGD/TCS  Authorization [id number] DOS -04/05/23-07/06/23

## 2023-04-12 ENCOUNTER — Encounter (INDEPENDENT_AMBULATORY_CARE_PROVIDER_SITE_OTHER): Payer: Self-pay

## 2023-04-13 ENCOUNTER — Encounter (HOSPITAL_COMMUNITY): Payer: Self-pay

## 2023-04-16 ENCOUNTER — Ambulatory Visit: Payer: Medicaid Other | Admitting: Family Medicine

## 2023-04-27 ENCOUNTER — Encounter: Payer: Self-pay | Admitting: Family Medicine

## 2023-04-27 ENCOUNTER — Ambulatory Visit (INDEPENDENT_AMBULATORY_CARE_PROVIDER_SITE_OTHER): Payer: Medicaid Other | Admitting: Family Medicine

## 2023-04-27 VITALS — BP 109/63 | HR 78 | Temp 99.0°F | Ht 69.0 in | Wt 230.0 lb

## 2023-04-27 DIAGNOSIS — F5101 Primary insomnia: Secondary | ICD-10-CM

## 2023-04-27 DIAGNOSIS — F339 Major depressive disorder, recurrent, unspecified: Secondary | ICD-10-CM | POA: Diagnosis not present

## 2023-04-27 DIAGNOSIS — M79605 Pain in left leg: Secondary | ICD-10-CM

## 2023-04-27 MED ORDER — VENLAFAXINE HCL ER 37.5 MG PO CP24
37.5000 mg | ORAL_CAPSULE | Freq: Every day | ORAL | 1 refills | Status: DC
Start: 2023-04-27 — End: 2023-05-25

## 2023-04-27 MED ORDER — HYDROXYZINE PAMOATE 25 MG PO CAPS: 25.0000 mg | ORAL_CAPSULE | Freq: Three times a day (TID) | ORAL | 0 refills | Status: DC | PRN

## 2023-04-27 MED ORDER — LIDOCAINE 5 % EX OINT: 1.0000 | TOPICAL_OINTMENT | CUTANEOUS | 5 refills | Status: DC | PRN

## 2023-04-27 NOTE — Patient Instructions (Signed)
        Great to see you today.   - Please take medications as prescribed. - Follow up with your primary health provider if any health concerns arises. - If symptoms worsen please contact your primary care provider and/or visit the emergency department.  

## 2023-04-27 NOTE — Assessment & Plan Note (Signed)
Flowsheet Row Office Visit from 04/27/2023 in [facility]  PHQ-9 Total Score 27     Started patient on Effexor 37.5 mg daily Follow up in 6 weeks Discussed about cognitive behavioral therapy focusing on thoughts, belief, and attitudes that affects feelings and behavior, learning about coping skills to deal with certain problems.Maintaining a consistent routine and schedule, Practice stress management and self calming techniques, excersise regularly and spend time outdoors, Do not eat food that are high in fat, added sugar, or salt. Referral to behavior health    Patient verbally consented to [facility] services about presenting concerns and psychiatric consultation as appropriate. The services will be billed as appropriate for the patient.

## 2023-04-27 NOTE — Progress Notes (Signed)
Patient Office Visit   Subjective   Patient ID: Jaime [name], female    DOB: Apr 11, 1986  Age: 37 y.o. MRN: [id number]  CC: No chief complaint on file.   HPI Jaime [name] 37 year old female, presents to the clinic for depression. She  has a past medical history of Acute encephalopathy (08/31/2020), Adjustment disorder with depressed mood (09/07/2020), Anxiety, Asthma, Chronic abdominal pain, Chronic back pain, Chronic prescription opiate use (02/16/2022), Cirrhosis (HCC), Cirrhosis (HCC), COPD (chronic obstructive pulmonary disease) (HCC), Depression, Diabetes mellitus without complication (HCC), Diabetes mellitus, type II (HCC), Diverticulosis, Fatty liver (03/07/2020), GERD (gastroesophageal reflux disease), Hepatitis C, Hernia, abdominal, Hyperglycemia due to type 2 diabetes mellitus (HCC) (05/24/2020), Hyperlipidemia, IBS (irritable bowel syndrome) (02/16/2022), Insomnia, 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  Depression      The patient presents with depression. This is a recurrent problem and occurs constantly. Patient reports depression has been gradually worsening since onset. Associated symptoms include helplessness, hopelessness, insomnia, restlessness, decreased interest, body aches and sad. Associated symptoms include no suicidal ideas.The symptoms are aggravated by family issues.Past treatments include SSRI and SNRI but patient abruptly stop taking medication few months ago.  Past compliance problems include difficulty with treatment plan.  Risk factors include family history, history of mental illness, the patient not taking medications correctly and stress.Past medical history includes chronic pain, chronic illness and depression.Pertinent negatives include no suicide  attempts.       Outpatient Encounter Medications as of 04/27/2023  Medication Sig   hydrOXYzine (VISTARIL) 25 MG capsule Take 1 capsule (25 mg total) by mouth every 8 (eight) hours as needed.   venlafaxine XR (EFFEXOR XR) 37.5 MG 24 hr capsule Take 1 capsule (37.5 mg total) by mouth daily with breakfast.   Accu-Chek Softclix Lancets lancets USE TO TEST BLOOD SUGAR 4 TIMES A DAY.   albuterol (VENTOLIN HFA) 108 (90 Base) MCG/ACT inhaler Inhale 2 puffs into the lungs every 6 (six) hours as needed for wheezing or shortness of breath.   aspirin EC 81 MG tablet Take 81 mg by mouth daily. Swallow whole.   cetirizine (ZYRTEC ALLERGY) 10 MG tablet Take 1 tablet (10 mg total) by mouth daily.   clonazePAM (KLONOPIN) 0.5 MG tablet Take 0.5 mg by mouth 2 (two) times daily.   Continuous Glucose Sensor (DEXCOM G6 SENSOR) MISC Change sensor every 10 days.   Continuous Glucose Transmitter (DEXCOM G6 TRANSMITTER) MISC USE AS DIRECTED.   cyclobenzaprine (FLEXERIL) 10 MG tablet Take 1 tablet (10 mg total) by mouth 3 (three) times daily as needed for muscle spasms.   enoxaparin (LOVENOX) 100 MG/ML injection Inject 1 mL (100 mg total) into the skin every 12 (twelve) hours.   etonogestrel (NEXPLANON) 68 MG IMPL implant 1 each by Subdermal route once.   famotidine (PEPCID) 20 MG tablet Take 1 tablet (20 mg total) by mouth 2 (two) times daily.   ferrous sulfate 325 (65 FE) MG EC tablet Take 1 tablet (325 mg total) by mouth daily with breakfast.   furosemide (LASIX) 40 MG tablet Take 40 mg by mouth daily.   glipiZIDE (GLUCOTROL XL) 5 MG 24 hr tablet Take 1 tablet (5 mg total) by mouth daily with breakfast.   glucose blood (ACCU-CHEK GUIDE) test strip Use as instructed to check blood glucose four times daily   hyoscyamine (ANASPAZ) 0.125  MG TBDP disintergrating tablet Place 0.125 mg under the tongue. One tid prn   Insulin Pen Needle (GLOBAL EASE INJECT PEN NEEDLES) 31G X 8 MM MISC Use 1 pen needle 4 times daily for  insulin.   Insulin Pen Needle (PEN NEEDLES) 32G X 6 MM MISC 1 each by Does not apply route 3 (three) times daily.   insulin regular human CONCENTRATED (HUMULIN R U-500 KWIKPEN) 500 UNIT/ML KwikPen Inject 90 Units into the skin 3 (three) times daily with meals.   lidocaine (XYLOCAINE) 5 % ointment Apply 1 Application topically as needed.   LINZESS 290 MCG CAPS capsule Take 290 mcg by mouth daily.   lisinopril (ZESTRIL) 5 MG tablet TAKE 1 TABLET ONCE DAILY.   lubiprostone (AMITIZA) 24 MCG capsule Take 1 capsule (24 mcg total) by mouth 2 (two) times daily with a meal.   metFORMIN (GLUCOPHAGE) 1000 MG tablet Take 1 tablet (1,000 mg total) by mouth 2 (two) times daily with a meal.   methocarbamol (ROBAXIN) 500 MG tablet Take 500 mg by mouth at bedtime.   Multiple Vitamin (MULTIVITAMIN) tablet Take 1 tablet by mouth daily.   nystatin (MYCOSTATIN/NYSTOP) powder Apply 1 application topically 3 (three) times daily.   omeprazole (PRILOSEC) 40 MG capsule Take 1 capsule (40 mg total) by mouth 2 (two) times daily.   ondansetron (ZOFRAN) 4 MG tablet Take 1 tablet (4 mg total) by mouth every 8 (eight) hours as needed for nausea or vomiting.   oxyCODONE (ROXICODONE) 5 MG immediate release tablet Take 1 tablet (5 mg total) by mouth every 4 (four) hours as needed for severe pain.   polyethylene glycol-electrolytes (TRILYTE) 420 g solution Take 4,000 mLs by mouth as directed.   potassium chloride (KLOR-CON M) 10 MEQ tablet Take 10 mEq by mouth 2 (two) times daily.   pregabalin (LYRICA) 75 MG capsule Take 75 mg by mouth in the morning, at noon, in the evening, and at bedtime.    PRESCRIPTION MEDICATION Methodone once daily   promethazine (PHENERGAN) 12.5 MG tablet Take 1 tablet (12.5 mg total) by mouth every 8 (eight) hours as needed for nausea or vomiting.   propranolol (INDERAL) 10 MG tablet Take 10 mg by mouth 3 (three) times daily.   RESTASIS 0.05 % ophthalmic emulsion Place 1 drop into both eyes as needed (dry  eye).    rosuvastatin (CRESTOR) 10 MG tablet Take 1 tablet (10 mg total) by mouth daily.   spironolactone (ALDACTONE) 100 MG tablet Take 100 mg by mouth daily.    sucralfate (CARAFATE) 1 GM/10ML suspension Take 10 mLs (1 g total) by mouth 4 (four) times daily.   Suvorexant (BELSOMRA) 20 MG TABS Take 20 mg by mouth at bedtime.   tirzepatide (MOUNJARO) 7.5 MG/0.5ML Pen Inject 7.5 mg into the skin once a week.   triamcinolone (KENALOG) 0.1 % Apply 1 application topically daily as needed (irritation).   Vitamin D, Ergocalciferol, (DRISDOL) 1.25 MG (50000 UNIT) CAPS capsule Take 1 capsule (50,000 Units total) by mouth every 7 (seven) days. (Patient taking differently: Take 50,000 Units by mouth every Tuesday.)   [DISCONTINUED] ARIPiprazole (ABILIFY) 20 MG tablet Take 20 mg by mouth daily.   [DISCONTINUED] DULoxetine (CYMBALTA) 30 MG capsule Take 1 capsule (30 mg total) by mouth daily.   [DISCONTINUED] lidocaine (XYLOCAINE) 5 % ointment Apply 1 Application topically as needed.   [DISCONTINUED] QUEtiapine (SEROQUEL) 25 MG tablet Take 25 mg by mouth at bedtime.   No facility-administered encounter medications on file as of 04/27/2023.  Past Surgical History:  Procedure Laterality Date   BIOPSY  11/25/2021   Procedure: BIOPSY;  Surgeon: Dolores Frame, MD;  [facility]: [facility];  Service: Gastroenterology;;   CHOLECYSTECTOMY     COLONOSCOPY WITH PROPOFOL N/A 11/25/2021   Procedure: COLONOSCOPY WITH PROPOFOL;  Surgeon: Dolores Frame, MD;  Location: [facility];  Service: Gastroenterology;  Laterality: N/A;  805   COLONOSCOPY WITH PROPOFOL N/A 03/31/2022   Procedure: COLONOSCOPY WITH PROPOFOL;  Surgeon: Dolores Frame, MD;  Location: [facility];  Service: Gastroenterology;  Laterality: N/A;  730   ESOPHAGOGASTRODUODENOSCOPY  06/2020   done at [facility], candida in upper esophagus (treated with diflucan), ulcerative esophagitis at GE junction, gastritis in stomach,  single ulcer in duodenal bulb, with duodenal mucosa showing no abnormality. No presence of varices   ESOPHAGOGASTRODUODENOSCOPY (EGD) WITH PROPOFOL N/A 11/25/2021   Procedure: ESOPHAGOGASTRODUODENOSCOPY (EGD) WITH PROPOFOL;  Surgeon: Dolores Frame, MD;  [facility]: [facility];  Service: Gastroenterology;  Laterality: N/A;   ESOPHAGOGASTRODUODENOSCOPY (EGD) WITH PROPOFOL N/A 03/31/2022   Procedure: ESOPHAGOGASTRODUODENOSCOPY (EGD) WITH PROPOFOL;  Surgeon: Dolores Frame, MD;  Location: [facility];  Service: Gastroenterology;  Laterality: N/A;    Review of Systems  Constitutional:  Negative for chills and fever.  Respiratory:  Negative for shortness of breath.   Cardiovascular:  Negative for chest pain.  Neurological:  Negative for dizziness.  Psychiatric/Behavioral:  Positive for depression. Negative for suicidal ideas. The patient is nervous/anxious and has insomnia.       Objective    BP 109/63   Pulse 78   Temp 99 F (37.2 C) (Oral)   Ht 5\' 9"  (1.753 m)   Wt 230 lb (104.3 kg)   LMP  (Approximate)   SpO2 95%   BMI 33.97 kg/m   Physical Exam Vitals reviewed.  Constitutional:      General: She is not in acute distress.    Appearance: Normal appearance. She is not ill-appearing, toxic-appearing or diaphoretic.  HENT:     Head: Normocephalic.  Eyes:     General:        Right eye: No discharge.        Left eye: No discharge.     Conjunctiva/sclera: Conjunctivae normal.  Cardiovascular:     Rate and Rhythm: Normal rate.     Pulses: Normal pulses.     Heart sounds: Normal heart sounds.  Pulmonary:     Effort: Pulmonary effort is normal. No respiratory distress.     Breath sounds: Normal breath sounds.  Musculoskeletal:        General: Normal range of motion.     Cervical back: Normal range of motion.  Skin:    General: Skin is warm and dry.     Capillary Refill: Capillary refill takes less than 2 seconds.  Neurological:     General: No focal  deficit present.     Mental Status: She is alert and oriented to person, place, and time.     Coordination: Coordination normal.     Gait: Gait normal.  Psychiatric:        Mood and Affect: Mood normal.       Assessment & Plan:  Depression, recurrent [facility]) Assessment & Plan: Flowsheet Row Office Visit from 04/27/2023 in [facility]  PHQ-9 Total Score 27     Started patient on Effexor 37.5 mg daily Follow up in 6 weeks Discussed about cognitive behavioral therapy focusing on thoughts, belief, and attitudes  that affects feelings and behavior, learning about coping skills to deal with certain problems.Maintaining a consistent routine and schedule, Practice stress management and self calming techniques, excersise regularly and spend time outdoors, Do not eat food that are high in fat, added sugar, or salt. Referral to behavior health    Patient verbally consented to [facility] services about presenting concerns and psychiatric consultation as appropriate. The services will be billed as appropriate for the patient.    Orders: -     Venlafaxine HCl ER; Take 1 capsule (37.5 mg total) by mouth daily with breakfast.  Dispense: 30 capsule; Refill: 1 -     Ambulatory referral to Behavioral Health  Left leg pain -     Lidocaine; Apply 1 Application topically as needed.  Dispense: 35.44 g; Refill: 5  Primary insomnia  Other orders -     hydrOXYzine Pamoate; Take 1 capsule (25 mg total) by mouth every 8 (eight) hours as needed.  Dispense: 30 capsule; Refill: 0    Return in about 6 weeks (around 06/08/2023) for Depression.   Cruzita Lederer Newman Nip, FNP

## 2023-04-28 ENCOUNTER — Encounter: Payer: Self-pay | Admitting: Obstetrics and Gynecology

## 2023-04-28 ENCOUNTER — Ambulatory Visit (INDEPENDENT_AMBULATORY_CARE_PROVIDER_SITE_OTHER): Payer: Medicaid Other | Admitting: Obstetrics and Gynecology

## 2023-04-28 VITALS — BP 109/64 | HR 77 | Ht 70.0 in | Wt 229.0 lb

## 2023-04-28 DIAGNOSIS — Z3046 Encounter for surveillance of implantable subdermal contraceptive: Secondary | ICD-10-CM

## 2023-04-28 DIAGNOSIS — Z30017 Encounter for initial prescription of implantable subdermal contraceptive: Secondary | ICD-10-CM | POA: Insufficient documentation

## 2023-04-28 MED ORDER — ETONOGESTREL 68 MG ~~LOC~~ IMPL: 68.0000 mg | DRUG_IMPLANT | Freq: Once | SUBCUTANEOUS | Status: AC

## 2023-04-28 MED ORDER — ETONOGESTREL 68 MG ~~LOC~~ IMPL
68.0000 mg | DRUG_IMPLANT | Freq: Once | SUBCUTANEOUS | Status: AC
  Administered 2023-04-28: 68 mg via SUBCUTANEOUS

## 2023-04-28 NOTE — Addendum Note (Signed)
Addended by: Hermina Staggers on: 04/28/2023 03:48 PM   Modules accepted: Orders

## 2023-04-28 NOTE — Progress Notes (Signed)
     GYNECOLOGY CLINIC PROCEDURE NOTE  Jaime [name] is a 37 y.o. G0P0000 here for Nexplanon removal and Nexplanon insertion.  Last pap smear was on 2021 and was normal.  No other gynecologic concerns.   Nexplanon Removal and Insertion  Patient identified, informed consent performed, consent signed.   Patient does understand that irregular bleeding is a very common side effect of this medication. She was advised to have backup contraception for one week after replacement of the implant. Pregnancy test in clinic today was negative.  Appropriate time out taken. Implanon site identified. Area prepped in usual sterile fashon. One ml of 1% lidocaine was used to anesthetize the area at the distal end of the implant. A small stab incision was made right beside the implant on the distal portion. The Nexplanon rod was grasped using hemostats and removed without difficulty. There was minimal blood loss. There were no complications. Area was then injected with 3 ml of 1 % lidocaine. She was re-prepped with betadine, Nexplanon removed from packaging, Device confirmed in needle, then inserted full length of needle and withdrawn per handbook instructions. Nexplanon was able to palpated in the patient's arm; patient palpated the insert herself.  There was minimal blood loss. Patient insertion site covered with guaze and a pressure bandage to reduce any bruising. The patient tolerated the procedure well and was given post procedure instructions.  She was advised to have backup contraception for one week.    Jaime Elm, MD, FACOG Attending Obstetrician & Gynecologist [facility], [facility]

## 2023-04-28 NOTE — Addendum Note (Signed)
Addended by: Hermina Staggers on: 04/28/2023 03:52 PM   Modules accepted: Orders

## 2023-04-28 NOTE — Addendum Note (Signed)
Addended by: Moss Mc on: 04/28/2023 03:53 PM   Modules accepted: Orders

## 2023-05-06 ENCOUNTER — Other Ambulatory Visit: Payer: Self-pay | Admitting: Family Medicine

## 2023-05-07 ENCOUNTER — Other Ambulatory Visit: Payer: Self-pay | Admitting: Family Medicine

## 2023-05-07 ENCOUNTER — Inpatient Hospital Stay: Payer: MEDICAID | Attending: Physician Assistant

## 2023-05-07 DIAGNOSIS — F17210 Nicotine dependence, cigarettes, uncomplicated: Secondary | ICD-10-CM | POA: Insufficient documentation

## 2023-05-07 DIAGNOSIS — R161 Splenomegaly, not elsewhere classified: Secondary | ICD-10-CM | POA: Insufficient documentation

## 2023-05-07 DIAGNOSIS — E8581 Light chain (AL) amyloidosis: Secondary | ICD-10-CM | POA: Insufficient documentation

## 2023-05-07 DIAGNOSIS — E611 Iron deficiency: Secondary | ICD-10-CM | POA: Insufficient documentation

## 2023-05-07 DIAGNOSIS — D696 Thrombocytopenia, unspecified: Secondary | ICD-10-CM | POA: Insufficient documentation

## 2023-05-07 DIAGNOSIS — I82432 Acute embolism and thrombosis of left popliteal vein: Secondary | ICD-10-CM | POA: Insufficient documentation

## 2023-05-07 DIAGNOSIS — D72819 Decreased white blood cell count, unspecified: Secondary | ICD-10-CM | POA: Insufficient documentation

## 2023-05-10 ENCOUNTER — Ambulatory Visit: Payer: Medicaid Other | Admitting: Family Medicine

## 2023-05-10 ENCOUNTER — Other Ambulatory Visit (INDEPENDENT_AMBULATORY_CARE_PROVIDER_SITE_OTHER): Payer: Self-pay | Admitting: Gastroenterology

## 2023-05-13 ENCOUNTER — Inpatient Hospital Stay: Payer: MEDICAID

## 2023-05-13 ENCOUNTER — Inpatient Hospital Stay: Payer: MEDICAID | Admitting: Physician Assistant

## 2023-05-14 ENCOUNTER — Inpatient Hospital Stay: Payer: MEDICAID

## 2023-05-14 DIAGNOSIS — E611 Iron deficiency: Secondary | ICD-10-CM

## 2023-05-14 DIAGNOSIS — D696 Thrombocytopenia, unspecified: Secondary | ICD-10-CM

## 2023-05-14 DIAGNOSIS — E8581 Light chain (AL) amyloidosis: Secondary | ICD-10-CM | POA: Diagnosis not present

## 2023-05-14 DIAGNOSIS — D72819 Decreased white blood cell count, unspecified: Secondary | ICD-10-CM | POA: Diagnosis not present

## 2023-05-14 DIAGNOSIS — F17210 Nicotine dependence, cigarettes, uncomplicated: Secondary | ICD-10-CM | POA: Diagnosis not present

## 2023-05-14 DIAGNOSIS — R161 Splenomegaly, not elsewhere classified: Secondary | ICD-10-CM | POA: Diagnosis not present

## 2023-05-14 DIAGNOSIS — I82432 Acute embolism and thrombosis of left popliteal vein: Secondary | ICD-10-CM | POA: Diagnosis not present

## 2023-05-14 DIAGNOSIS — D731 Hypersplenism: Secondary | ICD-10-CM

## 2023-05-14 LAB — CBC WITH DIFFERENTIAL/PLATELET
Abs Immature Granulocytes: 0 10*3/uL (ref 0.00–0.07)
Basophils Absolute: 0 10*3/uL (ref 0.0–0.1)
Basophils Relative: 1 %
Eosinophils Absolute: 0.3 10*3/uL (ref 0.0–0.5)
Eosinophils Relative: 8 %
HCT: 42.5 % (ref 36.0–46.0)
Hemoglobin: 14.2 g/dL (ref 12.0–15.0)
Immature Granulocytes: 0 %
Lymphocytes Relative: 17 %
Lymphs Abs: 0.8 10*3/uL (ref 0.7–4.0)
MCH: 30.1 pg (ref 26.0–34.0)
MCHC: 33.4 g/dL (ref 30.0–36.0)
MCV: 90.2 fL (ref 80.0–100.0)
Monocytes Absolute: 0.3 10*3/uL (ref 0.1–1.0)
Monocytes Relative: 6 %
Neutro Abs: 3 10*3/uL (ref 1.7–7.7)
Neutrophils Relative %: 68 %
Platelets: 59 10*3/uL — ABNORMAL LOW (ref 150–400)
RBC: 4.71 MIL/uL (ref 3.87–5.11)
RDW: 13.5 % (ref 11.5–15.5)
WBC: 4.4 10*3/uL (ref 4.0–10.5)
nRBC: 0 % (ref 0.0–0.2)

## 2023-05-14 LAB — COMPREHENSIVE METABOLIC PANEL
ALT: 40 U/L (ref 0–44)
AST: 38 U/L (ref 15–41)
Albumin: 3.3 g/dL — ABNORMAL LOW (ref 3.5–5.0)
Alkaline Phosphatase: 109 U/L (ref 38–126)
Anion gap: 6 (ref 5–15)
BUN: 10 mg/dL (ref 6–20)
CO2: 22 mmol/L (ref 22–32)
Calcium: 8.4 mg/dL — ABNORMAL LOW (ref 8.9–10.3)
Chloride: 106 mmol/L (ref 98–111)
Creatinine, Ser: 0.61 mg/dL (ref 0.44–1.00)
GFR, Estimated: >60 mL/min (ref >60)
Glucose, Bld: 200 mg/dL — ABNORMAL HIGH (ref 70–99)
Potassium: 4 mmol/L (ref 3.5–5.1)
Sodium: 134 mmol/L — ABNORMAL LOW (ref 135–145)
Total Bilirubin: 1.1 mg/dL (ref 0.3–1.2)
Total Protein: 6.5 g/dL (ref 6.5–8.1)

## 2023-05-14 LAB — IRON AND TIBC
Iron: 74 ug/dL (ref 28–170)
Saturation Ratios: 23 % (ref 10.4–31.8)
TIBC: 322 ug/dL (ref 250–450)
UIBC: 248 ug/dL

## 2023-05-14 LAB — FERRITIN: Ferritin: 37 ng/mL (ref 11–307)

## 2023-05-17 LAB — KAPPA/LAMBDA LIGHT CHAINS
Kappa free light chain: 71.5 mg/L — ABNORMAL HIGH (ref 3.3–19.4)
Kappa, lambda light chain ratio: 2.77 — ABNORMAL HIGH (ref 0.26–1.65)
Lambda free light chains: 25.8 mg/L (ref 5.7–26.3)

## 2023-05-18 LAB — MISC LABCORP TEST (SEND OUT): Labcorp test code: 121251

## 2023-05-18 LAB — PROTEIN ELECTROPHORESIS, SERUM
A/G Ratio: 1.1 (ref 0.7–1.7)
Albumin ELP: 3.3 g/dL (ref 2.9–4.4)
Alpha-1-Globulin: 0.2 g/dL (ref 0.0–0.4)
Alpha-2-Globulin: 0.5 g/dL (ref 0.4–1.0)
Beta Globulin: 1 g/dL (ref 0.7–1.3)
Gamma Globulin: 1.2 g/dL (ref 0.4–1.8)
Globulin, Total: 2.9 g/dL (ref 2.2–3.9)
Total Protein ELP: 6.2 g/dL (ref 6.0–8.5)

## 2023-05-19 ENCOUNTER — Other Ambulatory Visit (INDEPENDENT_AMBULATORY_CARE_PROVIDER_SITE_OTHER): Payer: Self-pay | Admitting: Gastroenterology

## 2023-05-19 LAB — IMMUNOFIXATION ELECTROPHORESIS
IgA: 484 mg/dL — ABNORMAL HIGH (ref 87–352)
IgG (Immunoglobin G), Serum: 1197 mg/dL (ref 586–1602)
IgM (Immunoglobulin M), Srm: 115 mg/dL (ref 26–217)
Total Protein ELP: 6.1 g/dL (ref 6.0–8.5)

## 2023-05-19 NOTE — Progress Notes (Signed)
[facility] [address], [place] [address]   CLINIC:  Medical Oncology/Hematology  PCP:  Rica Records, FNP [phone] [address] [place] [address] [phone]   REASON FOR VISIT:  Follow-up for thrombocytopenia + iron deficiency + DVT  PRIOR THERAPY: None  CURRENT THERAPY:Iron tablet, Lovenox  INTERVAL HISTORY:   Jaime [name] 37 y.o. female returns for routine follow-up of thrombocytopenia, iron deficiency, and DVT.  She was last seen by Georga Kaufmann PA-C on 03/11/2023.  At today's visit, she reports feeling fair.  She continues to remain fatigued with dyspnea on exertion and ice pica.  She is overall sedentary.  She has bilateral leg edema that is slightly improved per her report.  She remains on Lovenox without any bleeding events.  She had Nexplanon replaced on 04/28/2023.  She has little to no energy and 20% appetite.  Her weight is overall stable for the past 3 months, although she has been started on [facility] in June 2024.  ASSESSMENT & PLAN:  1.   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 Dental extractions 1 and half year ago without any excessive bleeding.  Reports prior history of rectal bleeding, but no active bleeding.    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  Cirrhotic changes in the liver. - She follows with Dr. Levon Hedger for cirrhosis and other GI complaints.  She is not yet on liver transplant list. - Hematology workup (01/14/2023): Negative rheumatoid factor, ANA, lupus anticoagulant. Normal B12, folate, copper, MMA. Immature platelet fraction 7.3%.  Reticulocytes 1.6%.  LDH normal. 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  Less likely would be ITP or bone marrow disorder at this time.  No evidence of nutritional deficiency. - PLAN: Will continue to monitor with periodic CBCs  2.  Iron deficiency without anemia - Labs from 01/14/2023 show normal Hgb 12.5/MCV 91.2, but with some mild iron deficiency (ferritin 66, iron saturation 11%) - Reports prior history of rectal bleeding, but no active bleeding.  LMP in 2018 (currently has implant) - Colonoscopy (03/31/2022): Preparation of the colon was poor.  Hemorrhoids found on perianal exam. 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  No gastric varices.  5 nonbleeding cratered gastric ulcers with a clear ulcer base in the gastric antrum. - She has been taking iron tablet every other day since April 2024 - Most recent labs (05/14/2023): Hgb 14.2/MCV 90.2, ferritin 37, iron saturation 23%.  Cystatin C and creatinine are both within normal range. - PLAN: Instructed to increase iron tablet to daily (instead of every other day) - We will recheck CBC/D, iron/TIBC, and ferritin in 3 to 6 months.  3.  Provoked left popliteal DVT - Reported pain in the left leg, swelling of both legs since around mid March.  Her mobility was limited due to swelling.  No previous history of DVT/PE. - Lower extremity Doppler (02/04/2023): Positive for DVT within the left popliteal vein.  No evidence of right lower extremity DVT. - Denies any miscarriages.  No personal history of APLS. 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  It was replaced in June 2024. - She was started on Lovenox 100 mg twice daily since the diagnosis of the DVT.  She does not report any bleeding issues. - EGD on 01/19/2023 did not  reveal any varices. - Labs from 02/11/2023 did not show evidence of antiphospholipid syndrome. - PLAN: We will check venous US Doppler of left leg.  Phone visit after ultrasound to discuss results and next steps. - Per Dr. Ellin Saba, patient can discontinue Lovenox if clot has resolved.  Will continue close monitoring due to low platelet count while she continues on anticoagulation. - Discussed with patient that while progesterone is less likely to cause DVT than estrogen based contraceptives, Nexplanon and other hormonal contraceptives are contraindicated since she had DVT while she had birth control device in place. - Healthy weight loss and increased activity level was encouraged  4.  Elevated light chains - Labs from 01/14/2023 showed SPEP normal.  Mildly elevated kappa free light chain 43.6, elevated lambda 34.1.  Normal FLC ratio 1.28. 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  Elevated kappa lambda ratio 2.77. 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  No clear evidence of plasma cell dyscrasia at this time. - PLAN: We will recheck MGUS labs in 1 year along with 24-hour urine.  If no major changes or new abnormalities at that time, no further testing would be needed.  5.  Social/family history: - She is currently on disability.  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  No history of alcohol use.  Current active smoker half pack to 1 pack of cigarettes per day for 5 years. - No family history of low platelets.  Father had melanoma.  No family history of leukemia.  PLAN SUMMARY: >> Venous duplex left lower extremity >> PHONE  visit after ultrasound  ** After phone visit, we will need to set patient up for follow-up labs and office visit in 3 to 6 months.      REVIEW OF SYSTEMS:   Review of Systems  Constitutional:  Positive for fatigue. Negative for appetite change, chills, diaphoresis, fever and unexpected weight change.  HENT:   Negative for lump/mass and nosebleeds.   Eyes:  Negative for eye problems.  Respiratory:  Positive for shortness of breath (with exertion). Negative for cough and hemoptysis.   Cardiovascular:  Negative for chest pain, leg swelling and palpitations.  Gastrointestinal:  Positive for nausea. Negative for abdominal pain, blood in stool, constipation, diarrhea and vomiting.  Genitourinary:  Negative for hematuria.   Musculoskeletal:  Positive for arthralgias and back pain.  Skin: Negative.   Neurological:  Positive for headaches and numbness. Negative for dizziness and light-headedness.  Hematological:  Does not bruise/bleed easily.  Psychiatric/Behavioral:  Positive for sleep disturbance.      PHYSICAL EXAM:  ECOG PERFORMANCE STATUS: 1 - Symptomatic but completely ambulatory  There were no vitals filed for this visit. There were no vitals filed for this visit. Physical Exam Constitutional:      Appearance: Normal appearance. She is morbidly obese.  Cardiovascular:     Heart sounds: Normal heart sounds.  Pulmonary:     Breath sounds: Normal breath sounds.  Musculoskeletal:     Right lower leg: Edema (1+) present.     Left lower leg: Edema (1+) present.  Neurological:     General: No focal deficit present.     Mental Status: Mental status is at baseline.  Psychiatric:        Behavior: Behavior normal. Behavior is cooperative.     PAST MEDICAL/SURGICAL HISTORY:  Past Medical History:  Diagnosis Date   Acute encephalopathy 08/31/2020   Adjustment disorder with depressed mood 09/07/2020   Anxiety    Asthma    Chronic abdominal pain    Chronic back pain    Chronic  prescription opiate use 02/16/2022   Cirrhosis (HCC)    Cirrhosis (HCC)    COPD (chronic obstructive pulmonary disease) (HCC)    Depression    Diabetes mellitus without complication (HCC)    Diabetes mellitus, type II (HCC)    Diverticulosis    Fatty liver 03/07/2020   GERD (gastroesophageal reflux disease)    Hepatitis C    Hernia, abdominal    Hyperglycemia due to type 2 diabetes mellitus (HCC) 05/24/2020   Hyperlipidemia    IBS (irritable bowel syndrome) 02/16/2022   Insomnia    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)    Lupus (HCC)    Migraine headache    Morbid obesity (HCC) 05/24/2020   Neuropathy    Nocturnal seizures (HCC)    Pain of upper abdomen 07/28/2021   Peptic ulcer    Spleen enlarged    Splenomegaly    Past Surgical History:  Procedure Laterality Date   BIOPSY  11/25/2021   Procedure: BIOPSY;  Surgeon: Dolores Frame, MD;  [facility];  Service: Gastroenterology;;   CHOLECYSTECTOMY     COLONOSCOPY WITH PROPOFOL N/A 11/25/2021   Procedure: COLONOSCOPY WITH PROPOFOL;  Surgeon: Dolores Frame, MD;  [facility]: [facility];  Service: Gastroenterology;  Laterality: N/A;  805   COLONOSCOPY WITH PROPOFOL N/A 03/31/2022   Procedure: COLONOSCOPY WITH PROPOFOL;  Surgeon: Dolores Frame, MD;  [facility]: [facility];  Service: Gastroenterology;  Laterality: N/A;  730   ESOPHAGOGASTRODUODENOSCOPY  06/2020   done at [facility], candida in upper esophagus (treated with diflucan), ulcerative esophagitis at GE junction, gastritis in stomach, single ulcer in duodenal bulb, with duodenal mucosa showing no abnormality. No presence of varices   ESOPHAGOGASTRODUODENOSCOPY (EGD) WITH PROPOFOL N/A 11/25/2021   Procedure: ESOPHAGOGASTRODUODENOSCOPY (EGD) WITH PROPOFOL;  Surgeon: Dolores Frame, MD;  [facility];  Service: Gastroenterology;  Laterality: N/A;   ESOPHAGOGASTRODUODENOSCOPY (EGD) WITH  PROPOFOL N/A 03/31/2022   Procedure: ESOPHAGOGASTRODUODENOSCOPY (EGD) WITH PROPOFOL;  Surgeon: Dolores Frame, MD;  [facility];  Service: Gastroenterology;  Laterality: N/A;    SOCIAL HISTORY:  Social History   Socioeconomic History   Marital status: Single    Spouse name: Not on file   Number of children: Not on file   Years of education: Not on file   Highest education level: Not on file  Occupational History   Not on file  Tobacco Use   Smoking status: Every Day    Current packs/day: 0.50    Types: Cigarettes    Passive exposure: Current   Smokeless tobacco: Never  Vaping Use   Vaping status: Former   Substances: Nicotine, Flavoring  Substance and Sexual Activity   Alcohol  use: No   Drug use: No   Sexual activity: Not Currently    Birth control/protection: Implant  Other Topics Concern   Not on file  Social History Narrative   Not on file   Social Determinants of Health   Financial Resource Strain: Medium Risk (05/09/2020)   Overall Financial Resource Strain (CARDIA)    Difficulty of Paying Living Expenses: Somewhat hard  Food Insecurity: Food Insecurity Present (01/14/2023)   Hunger Vital Sign    Worried About Running Out of Food in the Last Year: Sometimes true    Ran Out of Food in the Last Year: Never true  Transportation Needs: No Transportation Needs (01/14/2023)   PRAPARE - Administrator, Civil Service (Medical): No    Lack of Transportation (Non-Medical): No  Physical Activity: Insufficiently Active (05/09/2020)   Exercise Vital Sign    Days of Exercise per Week: 1 day    Minutes of Exercise per Session: 20 min  Stress: Stress Concern Present (05/09/2020)   [employer] of Occupational Health - Occupational Stress Questionnaire    Feeling of Stress : Very much  Social Connections: Unknown (03/04/2022)   Received from [facility]   Social Network    Social Network: Not on file  Intimate Partner  Violence: Not At Risk (01/14/2023)   Humiliation, Afraid, Rape, and Kick questionnaire    Fear of Current or Ex-Partner: No    Emotionally Abused: No    Physically Abused: No    Sexually Abused: No    FAMILY HISTORY:  Family History  Problem Relation Age of Onset   Hyperlipidemia Maternal Grandfather     CURRENT MEDICATIONS:  Outpatient Encounter Medications as of 05/20/2023  Medication Sig Note   Accu-Chek Softclix Lancets lancets USE TO TEST BLOOD SUGAR 4 TIMES A DAY.    albuterol (VENTOLIN HFA) 108 (90 Base) MCG/ACT inhaler Inhale 2 puffs into the lungs every 6 (six) hours as needed for wheezing or shortness of breath.    aspirin EC 81 MG tablet Take 81 mg by mouth daily. Swallow whole.    cetirizine (ZYRTEC ALLERGY) 10 MG tablet Take 1 tablet (10 mg total) by mouth daily.    clonazePAM (KLONOPIN) 0.5 MG tablet Take 0.5 mg by mouth 2 (two) times daily.    Continuous Glucose Sensor (DEXCOM G6 SENSOR) MISC Change sensor every 10 days.    Continuous Glucose Transmitter (DEXCOM G6 TRANSMITTER) MISC USE AS DIRECTED.    cyclobenzaprine (FLEXERIL) 10 MG tablet Take 1 tablet (10 mg total) by mouth 3 (three) times daily as needed for muscle spasms.    enoxaparin (LOVENOX) 100 MG/ML injection Inject 1 mL (100 mg total) into the skin every 12 (twelve) hours.    famotidine (PEPCID) 20 MG tablet Take 1 tablet (20 mg total) by mouth 2 (two) times daily.    ferrous sulfate 325 (65 FE) MG EC tablet Take 1 tablet (325 mg total) by mouth daily with breakfast.    furosemide (LASIX) 40 MG tablet Take 40 mg by mouth daily.    glipiZIDE (GLUCOTROL XL) 5 MG 24 hr tablet Take 1 tablet (5 mg total) by mouth daily with breakfast.    glucose blood (ACCU-CHEK GUIDE) test strip Use as instructed to check blood glucose four times daily    hydrOXYzine (VISTARIL) 25 MG capsule TAKE 1 CAPSULE BY MOUTH EVERY 8 HOURS AS NEEDED    hyoscyamine (ANASPAZ) 0.125 MG TBDP disintergrating tablet TAKE 1 TABLET UNDER THE TONGUE  THREE  TIMES A DAY AS NEEDED FOR BLADDER SPASMS OR CRAMPING.    Insulin Pen Needle (GLOBAL EASE INJECT PEN NEEDLES) 31G X 8 MM MISC Use 1 pen needle 4 times daily for insulin.    Insulin Pen Needle (PEN NEEDLES) 32G X 6 MM MISC 1 each by Does not apply route 3 (three) times daily.    insulin regular human CONCENTRATED (HUMULIN R U-500 KWIKPEN) 500 UNIT/ML KwikPen Inject 90 Units into the skin 3 (three) times daily with meals.    lidocaine (XYLOCAINE) 5 % ointment Apply 1 Application topically as needed.    LINZESS 290 MCG CAPS capsule Take 290 mcg by mouth daily.    lisinopril (ZESTRIL) 5 MG tablet TAKE 1 TABLET ONCE DAILY.    lubiprostone (AMITIZA) 24 MCG capsule Take 1 capsule (24 mcg total) by mouth 2 (two) times daily with a meal.    metFORMIN (GLUCOPHAGE) 1000 MG tablet Take 1 tablet (1,000 mg total) by mouth 2 (two) times daily with a meal.    methocarbamol (ROBAXIN) 500 MG tablet Take 500 mg by mouth at bedtime.    Multiple Vitamin (MULTIVITAMIN) tablet Take 1 tablet by mouth daily.    nystatin (MYCOSTATIN/NYSTOP) powder Apply 1 application topically 3 (three) times daily.    omeprazole (PRILOSEC) 40 MG capsule TAKE 1 CAPSULE BY MOUTH TWICE DAILY.    ondansetron (ZOFRAN) 4 MG tablet Take 1 tablet (4 mg total) by mouth every 8 (eight) hours as needed for nausea or vomiting.    oxyCODONE (ROXICODONE) 5 MG immediate release tablet Take 1 tablet (5 mg total) by mouth every 4 (four) hours as needed for severe pain.    polyethylene glycol-electrolytes (TRILYTE) 420 g solution Take 4,000 mLs by mouth as directed.    potassium chloride (KLOR-CON M) 10 MEQ tablet Take 10 mEq by mouth 2 (two) times daily.    pregabalin (LYRICA) 75 MG capsule Take 75 mg by mouth in the morning, at noon, in the evening, and at bedtime.     PRESCRIPTION MEDICATION Methodone once daily    promethazine (PHENERGAN) 12.5 MG tablet Take 1 tablet (12.5 mg total) by mouth every 8 (eight) hours as needed for nausea or vomiting.     propranolol (INDERAL) 10 MG tablet Take 10 mg by mouth 3 (three) times daily.    RESTASIS 0.05 % ophthalmic emulsion Place 1 drop into both eyes as needed (dry eye).     rosuvastatin (CRESTOR) 10 MG tablet Take 1 tablet (10 mg total) by mouth daily. 03/08/2023: 5mg    spironolactone (ALDACTONE) 100 MG tablet Take 100 mg by mouth daily.     sucralfate (CARAFATE) 1 GM/10ML suspension Take 10 mLs (1 g total) by mouth 4 (four) times daily.    Suvorexant (BELSOMRA) 20 MG TABS Take 20 mg by mouth at bedtime.    tirzepatide (MOUNJARO) 7.5 MG/0.5ML Pen Inject 7.5 mg into the skin once a week.    triamcinolone (KENALOG) 0.1 % Apply 1 application topically daily as needed (irritation).    venlafaxine XR (EFFEXOR XR) 37.5 MG 24 hr capsule Take 1 capsule (37.5 mg total) by mouth daily with breakfast.    Vitamin D, Ergocalciferol, (DRISDOL) 1.25 MG (50000 UNIT) CAPS capsule Take 1 capsule (50,000 Units total) by mouth every 7 (seven) days. (Patient taking differently: Take 50,000 Units by mouth every Tuesday.)    Facility-Administered Encounter Medications as of 05/20/2023  Medication   etonogestrel (NEXPLANON) implant 68 mg    ALLERGIES:  Allergies  Allergen Reactions  Iodine-131 Anaphylaxis, Shortness Of Breath and Swelling   Ivp Dye [Iodinated Contrast Media] Anaphylaxis    Skin gets very red, unable to walk    Ketorolac Tromethamine Shortness Of Breath   Tylenol [Acetaminophen] Other (See Comments)    Due to cirrhosis    Vancomycin Swelling    Facial swelling,    Gabapentin Itching   Nsaids Other (See Comments)    Flares ulcers   Suboxone [Buprenorphine Hcl-Naloxone Hcl]     Withdrawal symptoms     LABORATORY DATA:  I have reviewed the labs as listed.  CBC    Component Value Date/Time   WBC 4.4 05/14/2023 1312   RBC 4.71 05/14/2023 1312   HGB 14.2 05/14/2023 1312   HGB 13.5 01/01/2023 1408   HCT 42.5 05/14/2023 1312   HCT 41.9 01/01/2023 1408   PLT 59 (L) 05/14/2023 1312   PLT  55 (LL) 01/01/2023 1408   MCV 90.2 05/14/2023 1312   MCV 92 01/01/2023 1408   MCH 30.1 05/14/2023 1312   MCHC 33.4 05/14/2023 1312   RDW 13.5 05/14/2023 1312   RDW 13.1 01/01/2023 1408   LYMPHSABS 0.8 05/14/2023 1312   LYMPHSABS 0.7 01/01/2023 1408   MONOABS 0.3 05/14/2023 1312   EOSABS 0.3 05/14/2023 1312   EOSABS 0.1 01/01/2023 1408   BASOSABS 0.0 05/14/2023 1312   BASOSABS 0.0 01/01/2023 1408      Latest Ref Rng & Units 05/14/2023    1:12 PM 01/16/2023    6:21 AM 01/07/2023    6:30 AM  CMP  Glucose 70 - 99 mg/dL 347  61  86   BUN 6 - 20 mg/dL 10  6  7    Creatinine 0.44 - 1.00 mg/dL 4.25  9.56  3.87   Sodium 135 - 145 mmol/L 134  142  139   Potassium 3.5 - 5.1 mmol/L 4.0  3.8  3.8   Chloride 98 - 111 mmol/L 106  111  109   CO2 22 - 32 mmol/L 22  23  23    Calcium 8.9 - 10.3 mg/dL 8.4  8.3  8.1   Total Protein 6.5 - 8.1 g/dL 6.5  5.9  5.8   Total Bilirubin 0.3 - 1.2 mg/dL 1.1  0.8  0.8   Alkaline Phos 38 - 126 U/L 109  119  113   AST 15 - 41 U/L 38  40  39   ALT 0 - 44 U/L 40  24  26     DIAGNOSTIC IMAGING:  I have independently reviewed the relevant imaging and discussed with the patient.   WRAP UP:  All questions were answered. The patient knows to call the clinic with any problems, questions or concerns.  Medical decision making: Moderate  Time spent on visit: I spent 20 minutes counseling the patient face to face. The total time spent in the appointment was 30 minutes and more than 50% was on counseling.  Carnella Guadalajara, PA-C  05/20/2023 5:07 PM

## 2023-05-20 ENCOUNTER — Inpatient Hospital Stay: Payer: MEDICAID | Admitting: Physician Assistant

## 2023-05-20 VITALS — BP 128/82 | HR 85 | Temp 99.6°F | Resp 18 | Wt 239.9 lb

## 2023-05-20 DIAGNOSIS — I82432 Acute embolism and thrombosis of left popliteal vein: Secondary | ICD-10-CM

## 2023-05-20 DIAGNOSIS — D6959 Other secondary thrombocytopenia: Secondary | ICD-10-CM

## 2023-05-20 DIAGNOSIS — D696 Thrombocytopenia, unspecified: Secondary | ICD-10-CM | POA: Diagnosis not present

## 2023-05-20 DIAGNOSIS — E611 Iron deficiency: Secondary | ICD-10-CM

## 2023-05-20 DIAGNOSIS — D731 Hypersplenism: Secondary | ICD-10-CM

## 2023-05-20 MED ORDER — ENOXAPARIN SODIUM 100 MG/ML IJ SOSY: 100.0000 mg | PREFILLED_SYRINGE | Freq: Two times a day (BID) | INTRAMUSCULAR | 3 refills | Status: DC

## 2023-05-20 NOTE — Patient Instructions (Signed)
[facility] at [facility] **VISIT SUMMARY & IMPORTANT INSTRUCTIONS **   You were seen today by Rojelio Brenner PA-C for your follow-up visit.    BLOOD CLOT IN YOUR LEG ("DVT") We will check an ultrasound of your leg to see if your blood clot has resolved. You have several factors that could increase your risk of blood clots: Obesity Low levels of physical activity Implanted birth control (Nexplanon) Although Nexplanon does not often cause blood clots, it does increase the risk of blood clots.  It is NOT recommended to continue Nexplanon or any other hormonal birth control once you have been diagnosed with a blood clot.  Please reach out to the doctor that implanted your birth control to discuss having this removed and to discuss alternative methods of birth control.  IRON DEFICIENCY Increase your iron tablets so that you are taking it every day (instead of every other day)  LOW PLATELETS Your low platelets, which are due to your liver cirrhosis, are still low but are stable in their baseline range.  MEDICATIONS: Refill prescription for Lovenox has been sent to your pharmacy.  FOLLOW-UP APPOINTMENT: Phone visit in about 1 month (after ultrasound) to discuss results.  ** Thank you for trusting me with your healthcare!  I strive to provide all of my patients with quality care at each visit.  If you receive a survey for this visit, I would be so grateful to you for taking the time to provide feedback.  Thank you in advance!  ~ Donney Caraveo                   Dr. Doreatha Massed   &   Rojelio Brenner, PA-C   - - - - - - - - - - - - - - - - - -    Thank you for choosing [facility] at [facility] to provide your oncology and hematology care.  To afford each patient quality time with our provider, please arrive at least 15 minutes before your scheduled appointment time.   If you have a lab appointment with the Cancer Center please come in thru  the Main Entrance and check in at the main information desk.  You need to re-schedule your appointment should you arrive 10 or more minutes late.  We strive to give you quality time with our providers, and arriving late affects you and other patients whose appointments are after yours.  Also, if you no show three or more times for appointments you may be dismissed from the clinic at the providers discretion.     Again, thank you for choosing [facility].  Our hope is that these requests will decrease the amount of time that you wait before being seen by our physicians.       _____________________________________________________________  Should you have questions after your visit to [facility], please contact our office at [phone] and follow the prompts.  Our office hours are 8:00 a.m. and 4:30 p.m. Monday - Friday.  Please note that voicemails left after 4:00 p.m. may not be returned until the following business day.  We are closed weekends and major holidays.  You do have access to a nurse 24-7, just call the main number to the clinic [phone] and do not press any options, hold on the line and a nurse will answer the phone.    For prescription refill requests, have your pharmacy contact our office and  allow 72 hours.

## 2023-05-21 ENCOUNTER — Ambulatory Visit: Payer: Medicaid Other | Admitting: Family Medicine

## 2023-05-21 ENCOUNTER — Other Ambulatory Visit (HOSPITAL_COMMUNITY): Payer: Self-pay

## 2023-05-25 ENCOUNTER — Ambulatory Visit: Payer: MEDICAID | Admitting: Family Medicine

## 2023-05-25 ENCOUNTER — Encounter: Payer: Self-pay | Admitting: Family Medicine

## 2023-05-25 DIAGNOSIS — F339 Major depressive disorder, recurrent, unspecified: Secondary | ICD-10-CM

## 2023-05-25 MED ORDER — HYDROXYZINE PAMOATE 50 MG PO CAPS: 50.0000 mg | ORAL_CAPSULE | Freq: Three times a day (TID) | ORAL | 2 refills | Status: DC | PRN

## 2023-05-25 MED ORDER — VENLAFAXINE HCL ER 75 MG PO CP24: 75.0000 mg | ORAL_CAPSULE | Freq: Every day | ORAL | 2 refills | Status: DC

## 2023-05-25 NOTE — Progress Notes (Signed)
Patient Office Visit   Subjective   Patient ID: Jaime [name], female    DOB: [birth year]  Age: 37 y.o. MRN: [id number]  CC:  Chief Complaint  Patient presents with   Depression    Patient is here for f/u of depression. Wants to discuss increased dosage and something to help her sleep.     HPI Jaime [name] 37 year old female, presents to the clinic for depression follow up. She  has a past medical history of Acute encephalopathy (08/31/2020), Adjustment disorder with depressed mood (09/07/2020), Anxiety, Asthma, Chronic abdominal pain, Chronic back pain, Chronic prescription opiate use (02/16/2022), Cirrhosis (HCC), Cirrhosis (HCC), COPD (chronic obstructive pulmonary disease) (HCC), Depression, Diabetes mellitus without complication (HCC), Diabetes mellitus, type II (HCC), Diverticulosis, Fatty liver (03/07/2020), GERD (gastroesophageal reflux disease), Hepatitis C, Hernia, abdominal, Hyperglycemia due to type 2 diabetes mellitus (HCC) (05/24/2020), Hyperlipidemia, IBS (irritable bowel syndrome) (02/16/2022), Insomnia, 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   HPI    Outpatient Encounter Medications as of 05/25/2023  Medication Sig   Accu-Chek Softclix Lancets lancets USE TO TEST BLOOD SUGAR 4 TIMES A DAY.   albuterol (VENTOLIN HFA) 108 (90 Base) MCG/ACT inhaler Inhale 2 puffs into the lungs every 6 (six) hours as needed for wheezing or shortness of breath.   Continuous Glucose Sensor (DEXCOM G6 SENSOR) MISC Change sensor every 10 days.   Continuous Glucose Transmitter (DEXCOM G6 TRANSMITTER) MISC USE AS DIRECTED.   cyclobenzaprine (FLEXERIL) 10 MG tablet Take 1 tablet (10 mg total) by mouth 3 (three)  times daily as needed for muscle spasms.   enoxaparin (LOVENOX) 100 MG/ML injection Inject 1 mL (100 mg total) into the skin every 12 (twelve) hours.   famotidine (PEPCID) 20 MG tablet Take 1 tablet (20 mg total) by mouth 2 (two) times daily.   ferrous sulfate 325 (65 FE) MG EC tablet Take 1 tablet (325 mg total) by mouth daily with breakfast.   glipiZIDE (GLUCOTROL XL) 5 MG 24 hr tablet Take 1 tablet (5 mg total) by mouth daily with breakfast.   glucose blood (ACCU-CHEK GUIDE) test strip Use as instructed to check blood glucose four times daily   hydrOXYzine (VISTARIL) 50 MG capsule Take 1 capsule (50 mg total) by mouth 3 (three) times daily as needed.   hyoscyamine (ANASPAZ) 0.125 MG TBDP disintergrating tablet TAKE 1 TABLET UNDER THE TONGUE THREE TIMES A DAY AS NEEDED FOR BLADDER SPASMS OR CRAMPING.   Insulin Pen Needle (GLOBAL EASE INJECT PEN NEEDLES) 31G X 8 MM MISC Use 1 pen needle 4 times daily for insulin.   Insulin Pen Needle (PEN NEEDLES) 32G X 6 MM MISC 1 each by Does not apply route 3 (three) times daily.   insulin regular human CONCENTRATED (HUMULIN R U-500 KWIKPEN) 500 UNIT/ML KwikPen Inject 90 Units into the skin 3 (three) times daily with meals.   lidocaine (XYLOCAINE) 5 % ointment Apply 1 Application topically as needed.   LINZESS 290 MCG CAPS capsule Take 290 mcg by mouth daily.   lisinopril (ZESTRIL) 5 MG tablet TAKE 1 TABLET ONCE DAILY.   lubiprostone (AMITIZA) 24 MCG capsule Take 1 capsule (24 mcg total) by mouth 2 (two) times daily with a meal.   metFORMIN (GLUCOPHAGE) 1000 MG tablet Take 1 tablet (1,000 mg  total) by mouth 2 (two) times daily with a meal.   omeprazole (PRILOSEC) 40 MG capsule TAKE 1 CAPSULE BY MOUTH TWICE DAILY.   ondansetron (ZOFRAN) 4 MG tablet Take 1 tablet (4 mg total) by mouth every 8 (eight) hours as needed for nausea or vomiting.   polyethylene glycol-electrolytes (TRILYTE) 420 g solution Take 4,000 mLs by mouth as directed.   rosuvastatin (CRESTOR) 10  MG tablet Take 1 tablet (10 mg total) by mouth daily.   sucralfate (CARAFATE) 1 GM/10ML suspension Take 10 mLs (1 g total) by mouth 4 (four) times daily.   tirzepatide (MOUNJARO) 7.5 MG/0.5ML Pen Inject 7.5 mg into the skin once a week.   venlafaxine XR (EFFEXOR XR) 75 MG 24 hr capsule Take 1 capsule (75 mg total) by mouth daily with breakfast.   Vitamin D, Ergocalciferol, (DRISDOL) 1.25 MG (50000 UNIT) CAPS capsule Take 1 capsule (50,000 Units total) by mouth every 7 (seven) days. (Patient taking differently: Take 50,000 Units by mouth every Tuesday.)   [DISCONTINUED] aspirin EC 81 MG tablet Take 81 mg by mouth daily. Swallow whole.   [DISCONTINUED] cetirizine (ZYRTEC ALLERGY) 10 MG tablet Take 1 tablet (10 mg total) by mouth daily.   [DISCONTINUED] clonazePAM (KLONOPIN) 0.5 MG tablet Take 0.5 mg by mouth 2 (two) times daily.   [DISCONTINUED] furosemide (LASIX) 40 MG tablet Take 40 mg by mouth daily.   [DISCONTINUED] hydrOXYzine (VISTARIL) 25 MG capsule TAKE 1 CAPSULE BY MOUTH EVERY 8 HOURS AS NEEDED   [DISCONTINUED] methocarbamol (ROBAXIN) 500 MG tablet Take 500 mg by mouth at bedtime.   [DISCONTINUED] Multiple Vitamin (MULTIVITAMIN) tablet Take 1 tablet by mouth daily.   [DISCONTINUED] nystatin (MYCOSTATIN/NYSTOP) powder Apply 1 application topically 3 (three) times daily.   [DISCONTINUED] oxyCODONE (ROXICODONE) 5 MG immediate release tablet Take 1 tablet (5 mg total) by mouth every 4 (four) hours as needed for severe pain.   [DISCONTINUED] potassium chloride (KLOR-CON M) 10 MEQ tablet Take 10 mEq by mouth 2 (two) times daily.   [DISCONTINUED] pregabalin (LYRICA) 75 MG capsule Take 75 mg by mouth in the morning, at noon, in the evening, and at bedtime.    [DISCONTINUED] PRESCRIPTION MEDICATION Methodone once daily   [DISCONTINUED] promethazine (PHENERGAN) 12.5 MG tablet Take 1 tablet (12.5 mg total) by mouth every 8 (eight) hours as needed for nausea or vomiting.   [DISCONTINUED] propranolol  (INDERAL) 10 MG tablet Take 10 mg by mouth 3 (three) times daily.   [DISCONTINUED] QUEtiapine (SEROQUEL) 25 MG tablet Take 25 mg by mouth at bedtime.   [DISCONTINUED] RESTASIS 0.05 % ophthalmic emulsion Place 1 drop into both eyes as needed (dry eye).    [DISCONTINUED] spironolactone (ALDACTONE) 100 MG tablet Take 100 mg by mouth daily.    [DISCONTINUED] Suvorexant (BELSOMRA) 20 MG TABS Take 20 mg by mouth at bedtime.   [DISCONTINUED] triamcinolone (KENALOG) 0.1 % Apply 1 application topically daily as needed (irritation).   [DISCONTINUED] venlafaxine XR (EFFEXOR XR) 37.5 MG 24 hr capsule Take 1 capsule (37.5 mg total) by mouth daily with breakfast.   Facility-Administered Encounter Medications as of 05/25/2023  Medication   etonogestrel (NEXPLANON) implant 68 mg    Past Surgical History:  Procedure Laterality Date   BIOPSY  11/25/2021   Procedure: BIOPSY;  Surgeon: Dolores Frame, MD;  [facility];  Service: Gastroenterology;;   CHOLECYSTECTOMY     COLONOSCOPY WITH PROPOFOL N/A 11/25/2021   Procedure: COLONOSCOPY WITH PROPOFOL;  Surgeon: Dolores Frame, MD;  [facility];  Service: Gastroenterology;  Laterality: N/A;  805   COLONOSCOPY WITH PROPOFOL N/A 03/31/2022   Procedure: COLONOSCOPY WITH PROPOFOL;  Surgeon: Dolores Frame, MD;  [facility]: [facility];  Service: Gastroenterology;  Laterality: N/A;  730   ESOPHAGOGASTRODUODENOSCOPY  06/2020   done at [facility], candida in upper esophagus (treated with diflucan), ulcerative esophagitis at GE junction, gastritis in stomach, single ulcer in duodenal bulb, with duodenal mucosa showing no abnormality. No presence of varices   ESOPHAGOGASTRODUODENOSCOPY (EGD) WITH PROPOFOL N/A 11/25/2021   Procedure: ESOPHAGOGASTRODUODENOSCOPY (EGD) WITH PROPOFOL;  Surgeon: Dolores Frame, MD;  [facility];  Service: Gastroenterology;  Laterality: N/A;   ESOPHAGOGASTRODUODENOSCOPY (EGD)  WITH PROPOFOL N/A 03/31/2022   Procedure: ESOPHAGOGASTRODUODENOSCOPY (EGD) WITH PROPOFOL;  Surgeon: Dolores Frame, MD;  Location: [facility];  Service: Gastroenterology;  Laterality: N/A;    Review of Systems  Constitutional:  Negative for chills and fever.  Eyes:  Negative for blurred vision.  Respiratory:  Negative for shortness of breath.   Cardiovascular:  Negative for chest pain.  Neurological:  Negative for dizziness and headaches.  Psychiatric/Behavioral:  Positive for depression. The patient has insomnia.       Objective    BP 117/69   Pulse 89   Ht 5\' 9"  (1.753 m)   Wt 241 lb (109.3 kg)   LMP  (Approximate)   SpO2 96%   BMI 35.59 kg/m   Physical Exam Vitals reviewed.  Constitutional:      General: She is not in acute distress.    Appearance: Normal appearance. She is not ill-appearing, toxic-appearing or diaphoretic.  HENT:     Head: Normocephalic.  Eyes:     General:        Right eye: No discharge.        Left eye: No discharge.     Conjunctiva/sclera: Conjunctivae normal.  Cardiovascular:     Rate and Rhythm: Normal rate.     Pulses: Normal pulses.     Heart sounds: Normal heart sounds.  Pulmonary:     Effort: Pulmonary effort is normal. No respiratory distress.     Breath sounds: Normal breath sounds.  Musculoskeletal:        General: Normal range of motion.     Cervical back: Normal range of motion.  Skin:    General: Skin is warm and dry.     Capillary Refill: Capillary refill takes less than 2 seconds.  Neurological:     General: No focal deficit present.     Mental Status: She is alert and oriented to person, place, and time.     Coordination: Coordination normal.     Gait: Gait normal.  Psychiatric:        Mood and Affect: Mood normal.        Behavior: Behavior normal.       Assessment & Plan:  Depression, recurrent [facility]) Assessment & Plan: Flowsheet Row Office Visit from 05/25/2023 in [facility]   PHQ-9 Total Score 24     Patient reported taking Venlafaxine 37.5 mg daily,tolerating medication well, and has noted a "difference in her mood in a better way." PHQ- 9 screening slightly improvement from prior visit Increase Venlafaxine 75 mg daily Patient follow up with behavioral health services on 07/01/23 Follow up in 8 weeks Continued discussion on lifestyle changes, establishing a daily routine, going outdoors, exercise, healthy eating habits, mindfulness and mediatation.     Other orders -  hydrOXYzine Pamoate; Take 1 capsule (50 mg total) by mouth 3 (three) times daily as needed.  Dispense: 30 capsule; Refill: 2 -     Venlafaxine HCl ER; Take 1 capsule (75 mg total) by mouth daily with breakfast.  Dispense: 30 capsule; Refill: 2    Return in about 8 weeks (around 07/20/2023), or if symptoms worsen or fail to improve, for Depression, chronic follow-up, routine labs.   Cruzita Lederer Newman Nip, FNP

## 2023-05-25 NOTE — Assessment & Plan Note (Addendum)
Flowsheet Row Office Visit from 05/25/2023 in [facility]  PHQ-9 Total Score 24     Patient reported taking Venlafaxine 37.5 mg daily,tolerating medication well, and has noted a "difference in her mood in a better way." PHQ- 9 screening slightly improvement from prior visit Increase Venlafaxine 75 mg daily Patient follow up with behavioral health services on 07/01/23 Follow up in 8 weeks Continued discussion on lifestyle changes, establishing a daily routine, going outdoors, exercise, healthy eating habits, mindfulness and mediatation.

## 2023-05-25 NOTE — Patient Instructions (Signed)
        Great to see you today.   - Please take medications as prescribed. - Follow up with your primary health provider if any health concerns arises. - If symptoms worsen please contact your primary care provider and/or visit the emergency department.  

## 2023-05-27 NOTE — Patient Instructions (Signed)
Jaime [name]  05/27/2023     @PREFPERIOPPHARMACY @   Your procedure is scheduled on  06/01/2023.   Report to [facility] at  0600  A.M.   Call this number if you have problems the morning of surgery:  [phone]  If you experience any cold or flu symptoms such as cough, fever, chills, shortness of breath, etc. between now and your scheduled surgery, please notify us at the above number.   Remember:  Follow the diet and prep instructions given to you by the office.       Your last dose of mounjaro should have been on 05/24/2023.     Take 1/2 of your usual night time insulin the night before your procedure.      DO NOT take any medications for diabetes the morning of your procedure.       Use you r inhaler before you coe and bring your rescue inhaler with you.   Take these medicines the morning of surgery with A SIP OF WATER            flexeril, pepcid, hydroxyzine, omeprazole, zofran(if needed), effexor.     Do not wear jewelry, make-up or nail polish, including gel polish,  artificial nails, or any other type of covering on natural nails (fingers and  toes).  Do not wear lotions, powders, or perfumes, or deodorant.  Do not shave 48 hours prior to surgery.  Men may shave face and neck.  Do not bring valuables to the hospital.  [facility] is not responsible for any belongings or valuables.  Contacts, dentures or bridgework may not be worn into surgery.  Leave your suitcase in the car.  After surgery it may be brought to your room.  For patients admitted to the hospital, discharge time will be determined by your treatment team.  Patients discharged the day of surgery will not be allowed to drive home and must have someone with them for 24 hours.    Special instructions:   DO NOT smoke tobacco or vape for 24 hours before your procedure.  Please read over the following fact sheets that you were given. Anesthesia Post-op Instructions and Care and Recovery  After Surgery      Upper Endoscopy, Adult, Care After After the procedure, it is common to have a sore throat. It is also common to have: Mild stomach pain or discomfort. Bloating. Nausea. Follow these instructions at home: The instructions below may help you care for yourself at home. Your health care provider may give you more instructions. If you have questions, ask your health care provider. If you were given a sedative during the procedure, it can affect you for several hours. Do not drive or operate machinery until your health care provider says that it is safe. If you will be going home right after the procedure, plan to have a responsible adult: Take you home from the hospital or clinic. You will not be allowed to drive. Care for you for the time you are told. Follow instructions from your health care provider about what you may eat and drink. Return to your normal activities as told by your health care provider. Ask your health care provider what activities are safe for you. Take over-the-counter and prescription medicines only as told by your health care provider. Contact a health care provider if you: Have a sore throat that lasts longer than one day. Have trouble swallowing. Have a fever. Get help  right away if you: Vomit blood or your vomit looks like coffee grounds. Have bloody, black, or tarry stools. Have a very bad sore throat or you cannot swallow. Have difficulty breathing or very bad pain in your chest or abdomen. These symptoms may be an emergency. Get help right away. Call 911. Do not wait to see if the symptoms will go away. Do not drive yourself to the hospital. Summary After the procedure, it is common to have a sore throat, mild stomach discomfort, bloating, and nausea. If you were given a sedative during the procedure, it can affect you for several hours. Do not drive until your health care provider says that it is safe. Follow instructions from your health  care provider about what you may eat and drink. Return to your normal activities as told by your health care provider. This information is not intended to replace advice given to you by your health care provider. Make sure you discuss any questions you have with your health care provider. Document Revised: 01/28/2022 Document Reviewed: 01/28/2022 Elsevier Patient Education  2024 Elsevier [employer]. Colonoscopy, Adult, Care After The following information offers guidance on how to care for yourself after your procedure. Your health care provider may also give you more specific instructions. If you have problems or questions, contact your health care provider. What can I expect after the procedure? After the procedure, it is common to have: A small amount of blood in your stool for 24 hours after the procedure. Some gas. Mild cramping or bloating of your abdomen. Follow these instructions at home: Eating and drinking  Drink enough fluid to keep your urine pale yellow. Follow instructions from your health care provider about eating or drinking restrictions. Resume your normal diet as told by your health care provider. Avoid heavy or fried foods that are hard to digest. Activity Rest as told by your health care provider. Avoid sitting for a long time without moving. Get up to take short walks every 1-2 hours. This is important to improve blood flow and breathing. Ask for help if you feel weak or unsteady. Return to your normal activities as told by your health care provider. Ask your health care provider what activities are safe for you. Managing cramping and bloating  Try walking around when you have cramps or feel bloated. If directed, apply heat to your abdomen as told by your health care provider. Use the heat source that your health care provider recommends, such as a moist heat pack or a heating pad. Place a towel between your skin and the heat source. Leave the heat on for 20-30  minutes. Remove the heat if your skin turns bright red. This is especially important if you are unable to feel pain, heat, or cold. You have a greater risk of getting burned. General instructions If you were given a sedative during the procedure, it can affect you for several hours. Do not drive or operate machinery until your health care provider says that it is safe. For the first 24 hours after the procedure: Do not sign important documents. Do not drink alcohol. Do your regular daily activities at a slower pace than normal. Eat soft foods that are easy to digest. Take over-the-counter and prescription medicines only as told by your health care provider. Keep all follow-up visits. This is important. Contact a health care provider if: You have blood in your stool 2-3 days after the procedure. Get help right away if: You have more than a small spotting  of blood in your stool. You have large blood clots in your stool. You have swelling of your abdomen. You have nausea or vomiting. You have a fever. You have increasing pain in your abdomen that is not relieved with medicine. These symptoms may be an emergency. Get help right away. Call 911. Do not wait to see if the symptoms will go away. Do not drive yourself to the hospital. Summary After the procedure, it is common to have a small amount of blood in your stool. You may also have mild cramping and bloating of your abdomen. If you were given a sedative during the procedure, it can affect you for several hours. Do not drive or operate machinery until your health care provider says that it is safe. Get help right away if you have a lot of blood in your stool, nausea or vomiting, a fever, or increased pain in your abdomen. This information is not intended to replace advice given to you by your health care provider. Make sure you discuss any questions you have with your health care provider. Document Revised: 12/01/2022 Document Reviewed:  06/11/2021 Elsevier Patient Education  2024 Elsevier Inc. Monitored Anesthesia Care, Care After The following information offers guidance on how to care for yourself after your procedure. Your health care provider may also give you more specific instructions. If you have problems or questions, contact your health care provider. What can I expect after the procedure? After the procedure, it is common to have: Tiredness. Little or no memory about what happened during or after the procedure. Impaired judgment when it comes to making decisions. Nausea or vomiting. Some trouble with balance. Follow these instructions at home: For the time period you were told by your health care provider:  Rest. Do not participate in activities where you could fall or become injured. Do not drive or use machinery. Do not drink alcohol. Do not take sleeping pills or medicines that cause drowsiness. Do not make important decisions or sign legal documents. Do not take care of children on your own. Medicines Take over-the-counter and prescription medicines only as told by your health care provider. If you were prescribed antibiotics, take them as told by your health care provider. Do not stop using the antibiotic even if you start to feel better. Eating and drinking Follow instructions from your health care provider about what you may eat and drink. Drink enough fluid to keep your urine pale yellow. If you vomit: Drink clear fluids slowly and in small amounts as you are able. Clear fluids include water, ice chips, low-calorie sports drinks, and fruit juice that has water added to it (diluted fruit juice). Eat light and bland foods in small amounts as you are able. These foods include bananas, applesauce, rice, lean meats, toast, and crackers. General instructions  Have a responsible adult stay with you for the time you are told. It is important to have someone help care for you until you are awake and  alert. If you have sleep apnea, surgery and some medicines can increase your risk for breathing problems. Follow instructions from your health care provider about wearing your sleep device: When you are sleeping. This includes during daytime naps. While taking prescription pain medicines, sleeping medicines, or medicines that make you drowsy. Do not use any products that contain nicotine or tobacco. These products include cigarettes, chewing tobacco, and vaping devices, such as e-cigarettes. If you need help quitting, ask your health care provider. Contact a health care provider if: You  feel nauseous or vomit every time you eat or drink. You feel light-headed. You are still sleepy or having trouble with balance after 24 hours. You get a rash. You have a fever. You have redness or swelling around the IV site. Get help right away if: You have trouble breathing. You have new confusion after you get home. These symptoms may be an emergency. Get help right away. Call 911. Do not wait to see if the symptoms will go away. Do not drive yourself to the hospital. This information is not intended to replace advice given to you by your health care provider. Make sure you discuss any questions you have with your health care provider. Document Revised: 03/16/2022 Document Reviewed: 03/16/2022 Elsevier Patient Education  2024 [employer].

## 2023-05-28 ENCOUNTER — Encounter (HOSPITAL_COMMUNITY)
Admission: RE | Admit: 2023-05-28 | Discharge: 2023-05-28 | Disposition: A | Discharge status: Home / Self Care | Payer: MEDICAID | Source: Ambulatory Visit | Attending: Gastroenterology | Admitting: Gastroenterology

## 2023-05-28 ENCOUNTER — Encounter (HOSPITAL_COMMUNITY): Payer: Self-pay

## 2023-05-28 DIAGNOSIS — K7460 Unspecified cirrhosis of liver: Secondary | ICD-10-CM

## 2023-05-31 ENCOUNTER — Encounter (HOSPITAL_COMMUNITY): Payer: Self-pay

## 2023-05-31 ENCOUNTER — Encounter (HOSPITAL_COMMUNITY)
Admission: RE | Admit: 2023-05-31 | Discharge: 2023-05-31 | Disposition: A | Discharge status: Home / Self Care | Payer: MEDICAID | Source: Ambulatory Visit | Attending: Gastroenterology | Admitting: Gastroenterology

## 2023-05-31 ENCOUNTER — Telehealth (INDEPENDENT_AMBULATORY_CARE_PROVIDER_SITE_OTHER): Payer: Self-pay | Admitting: Gastroenterology

## 2023-05-31 DIAGNOSIS — Z01812 Encounter for preprocedural laboratory examination: Secondary | ICD-10-CM | POA: Diagnosis not present

## 2023-05-31 DIAGNOSIS — Z01818 Encounter for other preprocedural examination: Secondary | ICD-10-CM | POA: Diagnosis present

## 2023-05-31 DIAGNOSIS — K7460 Unspecified cirrhosis of liver: Secondary | ICD-10-CM | POA: Diagnosis not present

## 2023-05-31 LAB — PROTIME-INR
INR: 1.2 (ref 0.8–1.2)
Prothrombin Time: 15 seconds (ref 11.4–15.2)

## 2023-05-31 NOTE — Telephone Encounter (Signed)
Thanks. Per Dr. Johnnette Litter (anesthesia), will need formal clearance to hold Lovenox once it has been confirmed resolution of thrombosis with Doppler

## 2023-05-31 NOTE — Telephone Encounter (Signed)
    05/31/23  Jaime [name] 06/25/1986  What type of surgery is being performed? Colonoscopy/EGD  When is surgery scheduled? TBD  Hematology Clearance (pt has doppler ultrasound scheduled for 06/10/23)  Name of physician performing surgery?  Dr. Katrinka Blazing [facility] Gastroenterology at [facility] Phone: [phone] Fax: [phone]  Anethesia type (none, local, MAC, general)? MAC

## 2023-05-31 NOTE — Telephone Encounter (Signed)
Elsie Amis, RN  Marlowe Shores, LPN Yes, if she's stopped eating and all for her prep.       Previous Messages    ----- Message ----- From: Marlowe Shores, LPN Sent: 1/61/0960   9:18 AM EDT To: Elsie Amis, RN Subject: RE: no show                                    Good morning. I will try to get in touch with her. Could she come for pre op today? ----- Message ----- From: Elsie Amis, RN Sent: 05/31/2023   8:31 AM EDT To: Mindy S Estudillo, CMA; Marlowe Shores, LPN Subject: no show                                        Good morning and Happy Monday ladies! I was not seeing patients on Friday, but I found her chart this morning, [name] did not show for her preop Friday, but is still a first case for [facility] for tomorrow. She is still showing on the schedule.  Do you guys know anything about this? Im justv trying to figure out what's up since I wasn't seeing patients.    Left message to return call

## 2023-05-31 NOTE — Telephone Encounter (Signed)
Pt contacted and advised we need to cancel procedure due to needing hematology clearance. Pt has doppler US coming up on 06/10/23 and we can reschedule after that if cleared. Will send clearance to hematology.

## 2023-05-31 NOTE — Telephone Encounter (Signed)
Pt returned call and states she could not make it in time on Friday. Advised pt to head to hospital to complete pre op. Pt states she has began clear liquids and holding medications per instructions.

## 2023-05-31 NOTE — Telephone Encounter (Signed)
I have called pt to let her know we will reschedule after doppler.

## 2023-06-01 ENCOUNTER — Ambulatory Visit (HOSPITAL_COMMUNITY): Admission: RE | Admit: 2023-06-01 | Payer: MEDICAID | Source: Ambulatory Visit | Admitting: Gastroenterology

## 2023-06-01 ENCOUNTER — Encounter (HOSPITAL_COMMUNITY): Admission: RE | Payer: Self-pay | Source: Ambulatory Visit

## 2023-06-01 SURGERY — COLONOSCOPY WITH PROPOFOL
Anesthesia: Monitor Anesthesia Care

## 2023-06-04 ENCOUNTER — Other Ambulatory Visit: Payer: Self-pay | Admitting: *Deleted

## 2023-06-04 ENCOUNTER — Telehealth: Payer: Self-pay | Admitting: Nurse Practitioner

## 2023-06-04 DIAGNOSIS — E782 Mixed hyperlipidemia: Secondary | ICD-10-CM

## 2023-06-04 MED ORDER — ROSUVASTATIN CALCIUM 10 MG PO TABS
10.0000 mg | ORAL_TABLET | Freq: Every day | ORAL | 3 refills | Status: DC
Start: 2023-06-04 — End: 2023-09-14

## 2023-06-04 NOTE — Telephone Encounter (Signed)
This has been done.

## 2023-06-07 ENCOUNTER — Ambulatory Visit: Payer: Medicaid Other | Admitting: Family Medicine

## 2023-06-10 ENCOUNTER — Ambulatory Visit (HOSPITAL_COMMUNITY): Admission: RE | Admit: 2023-06-10 | Payer: MEDICAID | Source: Ambulatory Visit

## 2023-06-15 ENCOUNTER — Encounter: Payer: Self-pay | Admitting: Physician Assistant

## 2023-06-15 NOTE — Progress Notes (Deleted)
   VIRTUAL VISIT via TELEPHONE NOTE [facility]   I connected with Jaime [name]  on *** at  *** by telephone and verified that I am speaking with the correct person using two identifiers.  Location: Patient: Home Provider: [facility]   I discussed the limitations, risks, security and privacy concerns of performing an evaluation and management service by telephone and the availability of in person appointments. I also discussed with the patient that there may be a patient responsible charge related to this service. The patient expressed understanding and agreed to proceed.  REASON FOR VISIT: ***  PRIOR THERAPY: ***  CURRENT THERAPY: ***  INTERVAL HISTORY:  Jaime [name] is contacted today for follow-up of ***.  She was last seen ***. At today's visit, she reports feeling ***.   REVIEW OF SYSTEMS: ***  ROS   PHYSICAL EXAM: (per limitations of virtual telephone visit)  The patient is alert and oriented x 3, exhibiting adequate mentation, good mood, and ability to speak in full sentences and execute sound judgement.***  ASSESSMENT & PLAN:  1.  *** - *** - PLAN: ***  2.  *** - *** - PLAN: ***  3.  *** - *** - PLAN: ***  4.  *** - *** - PLAN: ***  PLAN SUMMARY: >> *** >> *** >> ***    I discussed the assessment and treatment plan with the patient. The patient was provided an opportunity to ask questions and all were answered. The patient agreed with the plan and demonstrated an understanding of the instructions.   The patient was advised to call back or seek an in-person evaluation if the symptoms worsen or if the condition fails to improve as anticipated.  I provided *** minutes of non-face-to-face time during this encounter.   Carnella Guadalajara, PA-C ***

## 2023-06-16 ENCOUNTER — Inpatient Hospital Stay: Payer: MEDICAID | Admitting: Physician Assistant

## 2023-06-16 ENCOUNTER — Other Ambulatory Visit: Payer: Self-pay | Admitting: Nurse Practitioner

## 2023-06-17 ENCOUNTER — Institutional Professional Consult (permissible substitution): Payer: No Typology Code available for payment source | Admitting: Professional Counselor

## 2023-06-21 ENCOUNTER — Other Ambulatory Visit: Payer: Self-pay | Admitting: Family Medicine

## 2023-06-21 ENCOUNTER — Other Ambulatory Visit: Payer: Self-pay | Admitting: Nurse Practitioner

## 2023-06-21 DIAGNOSIS — R1013 Epigastric pain: Secondary | ICD-10-CM

## 2023-06-22 ENCOUNTER — Telehealth: Payer: Self-pay | Admitting: *Deleted

## 2023-06-22 NOTE — Telephone Encounter (Signed)
Patient contacted to reschedule her [place] of left leg to evaluate previous DVT and follow up with Rojelio Brenner, PAC.  Per Lupe Carney, she was instructed to continue Lovenox and reach out to her gyn to make an appointment for removal of Nexplanon, as this could have resulted in her DVT.  Patient verbalized understanding and appointments made.

## 2023-06-22 NOTE — Telephone Encounter (Signed)
Have her follow up with her PCP, I'm not sure why she discontinued it, would hate to step on their toes.

## 2023-06-28 ENCOUNTER — Ambulatory Visit (INDEPENDENT_AMBULATORY_CARE_PROVIDER_SITE_OTHER): Payer: Medicaid Other | Admitting: Gastroenterology

## 2023-06-30 ENCOUNTER — Ambulatory Visit (HOSPITAL_COMMUNITY): Admission: RE | Admit: 2023-06-30 | Payer: MEDICAID | Source: Ambulatory Visit

## 2023-07-01 ENCOUNTER — Institutional Professional Consult (permissible substitution): Payer: No Typology Code available for payment source | Admitting: Professional Counselor

## 2023-07-02 ENCOUNTER — Ambulatory Visit (INDEPENDENT_AMBULATORY_CARE_PROVIDER_SITE_OTHER): Payer: MEDICAID | Admitting: Professional Counselor

## 2023-07-02 DIAGNOSIS — F332 Major depressive disorder, recurrent severe without psychotic features: Secondary | ICD-10-CM | POA: Diagnosis not present

## 2023-07-02 NOTE — Patient Instructions (Signed)
If your symptoms worsen or you have thoughts of suicide/homicide, PLEASE SEEK IMMEDIATE MEDICAL ATTENTION.  You may always call:   National Suicide Hotline: 988 or 800-273-8255 [place] Crisis Line: 336-832-9700 Crisis Recovery in [place]: 800-939-5911      These are available 24 hours a day, 7 days a week.  

## 2023-07-02 NOTE — BH Specialist Note (Unsigned)
Collaborative Care Initial Assessment  Session Start time: 2:00 pm   Session End time: 3:00  Total time in minutes: 60 min   Type of Contact:  Face to face Patient consent obtained:  Yes Types of Service: Collaborative care  Summary  Patient is a 37 yo female being referred to collaborative care by her pcp for anxiety and depression. Patient was engaged and cooperative during session.   Reason for referral in patient/family's own words:  "Not sleeping at all"  Patient's goal for today's visit: "Need something that will help me sleep"  History of Present illness:   The patient is a 38 year old female with a history of severe anxiety and chronic depression. She presents with a chief complaint of insomnia that began approximately two weeks ago, resulting in her getting only one to two hours of sleep per night since then. This lack of sleep has exacerbated her depressive symptoms, causing low energy, irritability, loss of interest in activities, and increased anxiety.  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 Since this incident, she has had persistent feelings of panic and worry, although these symptoms have been present chronically throughout her life. The memory loss from the diabetic coma has affected her ability to recall many details, but she remembers always feeling depressed. She does not recall any trauma history or specific events that might have triggered her depression and anxiety beyond the memory loss related to the diabetic coma.  The onset of her insomnia coincided with an increase in her Effexor dosage two weeks ago, which she believes might be causing side effects affecting her sleep. Before this adjustment, she had found some relief from her depressive symptoms with Effexor. Her primary care provider advised her to take the medication early in the morning to minimize its impact on sleep, which she has been  doing, but she still experiences significant insomnia.  The patient has no history of substance abuse, psychiatric hospitalizations, or suicide attempts, and she currently has no thoughts of self-harm or suicidality. Despite her chronic symptoms, she describes her baseline mood as consistently depressed, a state she has learned to live with over time.  The purpose of her visit today is to address the increased depression and insomnia. She is seeking potential medication options to help with her sleep and is interested in exploring general therapy and counseling. Additionally, she is considering psychiatric evaluation, as she has struggled with these symptoms for much of her life and has never found adequate relief.  Clinical Assessment   PHQ-9 Assessments:    07/02/2023    2:22 PM 05/25/2023    3:48 PM 04/27/2023    5:17 PM 02/05/2023    4:05 PM 02/02/2023    3:17 PM  Depression screen PHQ 2/9  Decreased Interest 3 3 3 3 3   Down, Depressed, Hopeless 3 3 3 3 3   PHQ - 2 Score 6 6 6 6 6   Altered sleeping 3 3 3 3 3   Tired, decreased energy 3 3 3 3 3   Change in appetite 3 3 3 3 3   Feeling bad or failure about yourself  3 3 3 3 3   Trouble concentrating 3 3 3 3 3   Moving slowly or fidgety/restless 3 3 3 3 3   Suicidal thoughts 0 0 3 0 0  PHQ-9 Score 24 24 27 24 24   Difficult doing work/chores Very difficult Extremely dIfficult Extremely dIfficult Extremely dIfficult Extremely dIfficult  GAD-7 Assessments:    05/25/2023    3:48 PM 04/27/2023    5:18 PM 02/05/2023    4:05 PM 02/02/2023    3:18 PM  GAD 7 : Generalized Anxiety Score  Nervous, Anxious, on Edge 3 3 3 3   Control/stop worrying 3 3 3 3   Worry too much - different things 3 3 3 3   Trouble relaxing 3 3 3 3   Restless 3 3 3 3   Easily annoyed or irritable 3 3 3 3   Afraid - awful might happen 3 3 3 3   Total GAD 7 Score 21 21 21 21   Anxiety Difficulty Extremely difficult Extremely difficult Extremely difficult Extremely difficult      Social History:  Household: Lives with husband and father Marital status: Marred Number of Children: No Employment: Disability Education: Some college  Psychiatric Review of systems: Insomnia: Not sleeping at all Changes in appetite: Don't eat a lot at all Decreased need for sleep: No Family history of bipolar disorder: No Hallucinations: No   Paranoia: No    Psychotropic medications: Current medications: Effexor 75 mg Patient taking medications as prescribed:  Yes Side effects reported: Yes  Current medications (medication list) Current Outpatient Medications on File Prior to Visit  Medication Sig Dispense Refill   Accu-Chek Softclix Lancets lancets USE TO TEST BLOOD SUGAR 4 TIMES A DAY. 150 each 3   albuterol (VENTOLIN HFA) 108 (90 Base) MCG/ACT inhaler Inhale 2 puffs into the lungs every 6 (six) hours as needed for wheezing or shortness of breath. 8 g 0   Continuous Glucose Sensor (DEXCOM G6 SENSOR) MISC Change sensor every 10 days. 9 each 3   Continuous Glucose Transmitter (DEXCOM G6 TRANSMITTER) MISC USE AS DIRECTED. 1 each 1   cyclobenzaprine (FLEXERIL) 10 MG tablet Take 1 tablet (10 mg total) by mouth 3 (three) times daily as needed for muscle spasms. 30 tablet 0   enoxaparin (LOVENOX) 100 MG/ML injection Inject 1 mL (100 mg total) into the skin every 12 (twelve) hours. 60 mL 3   famotidine (PEPCID) 20 MG tablet Take 1 tablet (20 mg total) by mouth 2 (two) times daily. 30 tablet 0   ferrous sulfate 325 (65 FE) MG EC tablet Take 1 tablet (325 mg total) by mouth daily with breakfast. 90 tablet 3   glipiZIDE (GLUCOTROL XL) 5 MG 24 hr tablet Take 1 tablet (5 mg total) by mouth daily with breakfast. 90 tablet 1   glucose blood (ACCU-CHEK GUIDE) test strip Use as instructed to check blood glucose four times daily 150 each 5   hydrOXYzine (VISTARIL) 50 MG capsule Take 1 capsule (50 mg total) by mouth 3 (three) times daily as needed. 30 capsule 2   hyoscyamine (ANASPAZ) 0.125 MG  TBDP disintergrating tablet TAKE 1 TABLET UNDER THE TONGUE THREE TIMES A DAY AS NEEDED FOR BLADDER SPASMS OR CRAMPING. 90 tablet 0   Insulin Pen Needle (GLOBAL EASE INJECT PEN NEEDLES) 31G X 8 MM MISC Use 1 pen needle 4 times daily for insulin. 400 each 0   Insulin Pen Needle (PEN NEEDLES) 32G X 6 MM MISC 1 each by Does not apply route 3 (three) times daily. 100 each 3   insulin regular human CONCENTRATED (HUMULIN R U-500 KWIKPEN) 500 UNIT/ML KwikPen Inject 90 Units into the skin 3 (three) times daily with meals. 45 mL 3   lidocaine (XYLOCAINE) 5 % ointment Apply 1 Application topically as needed. 35.44 g 5   LINZESS 290 MCG CAPS capsule Take 290 mcg by  mouth daily.     lisinopril (ZESTRIL) 5 MG tablet TAKE 1 TABLET ONCE DAILY. 30 tablet 0   lubiprostone (AMITIZA) 24 MCG capsule Take 1 capsule (24 mcg total) by mouth 2 (two) times daily with a meal. 180 capsule 3   metFORMIN (GLUCOPHAGE) 1000 MG tablet Take 1 tablet (1,000 mg total) by mouth 2 (two) times daily with a meal. 180 tablet 3   omeprazole (PRILOSEC) 40 MG capsule TAKE 1 CAPSULE BY MOUTH TWICE DAILY. 120 capsule 5   ondansetron (ZOFRAN) 4 MG tablet Take 1 tablet (4 mg total) by mouth every 8 (eight) hours as needed for nausea or vomiting. 20 tablet 1   pantoprazole (PROTONIX) 20 MG tablet Take 1 tablet by mouth once daily 90 tablet 0   polyethylene glycol-electrolytes (TRILYTE) 420 g solution Take 4,000 mLs by mouth as directed. 4000 mL 0   rosuvastatin (CRESTOR) 10 MG tablet Take 1 tablet (10 mg total) by mouth daily. 90 tablet 3   sucralfate (CARAFATE) 1 GM/10ML suspension Take 10 mLs (1 g total) by mouth 4 (four) times daily. 420 mL 1   tirzepatide (MOUNJARO) 7.5 MG/0.5ML Pen Inject 7.5 mg into the skin once a week. 6 mL 0   venlafaxine XR (EFFEXOR XR) 75 MG 24 hr capsule Take 1 capsule (75 mg total) by mouth daily with breakfast. 30 capsule 2   Vitamin D, Ergocalciferol, (DRISDOL) 1.25 MG (50000 UNIT) CAPS capsule Take 1 capsule  (50,000 Units total) by mouth every 7 (seven) days. (Patient taking differently: Take 50,000 Units by mouth every Tuesday.) 12 capsule 0   Current Facility-Administered Medications on File Prior to Visit  Medication Dose Route Frequency Provider Last Rate Last Admin   etonogestrel (NEXPLANON) implant 68 mg  68 mg Subdermal Once Hermina Staggers, MD        Psychiatric History: Past psychiatry diagnosis: No Patient currently being seen by therapist/psychiatrist: No  Prior Suicide Attempts: No Past psychiatry Hospitalization(s): No Past history of violence: No  Traumatic Experiences: History or current traumatic events (natural disaster, house fire, etc.)? no History or current physical trauma?  no History or current emotional trauma?  no History or current sexual trauma?  no History or current domestic or intimate partner violence?  no PTSD symptoms if any traumatic experiences. NO  Alcohol and/or Substance Use History   Tobacco Alcohol Other substances  Current use  1-2 drinks a month None  Past use     Past treatment      Withdrawal Potential: None  Self-harm Behaviors Risk Assessment Self-harm risk factors:  No risk Patient endorses recent thoughts of harming self: Denies  Guns in the home: No   Protective factors: "I like my life and I have support"  Danger to Others Risk Assessment Danger to others risk factors: No  Patient endorses recent thoughts of harming others:  Denies  Consulting civil engineer discussed emergency crisis plan with client and provided local emergency services resources.  Mental status exam:   General Appearance Jaime [name]:  Casual Eye Contact:  Good Motor Behavior:  Normal Speech:  Normal Level of Consciousness:  Alert Mood:  Anxious Affect:  Depressed Anxiety Level:  None Thought Process:  Coherent Thought Content:  WNL Perception:  Normal Judgment:  Good Insight:  Present  Diagnosis:   Goals:  improve sleep and decrease  depression   Interventions: CBT Cognitive Behavioral Therapy and Medication Monitoring

## 2023-07-07 ENCOUNTER — Telehealth (INDEPENDENT_AMBULATORY_CARE_PROVIDER_SITE_OTHER): Payer: MEDICAID | Admitting: Professional Counselor

## 2023-07-07 DIAGNOSIS — F332 Major depressive disorder, recurrent severe without psychotic features: Secondary | ICD-10-CM

## 2023-07-07 NOTE — BH Specialist Note (Addendum)
Behavioral Health Treatment Plan Team Note  MRN: [id number] NAME: Jaime [name]  DATE: 07/09/23  Start time: Start Time: 0233 End time: Stop Time: 0256 Total time: Total Time in Minutes (Visit): 23 Documentation time: 15 min Total time: 38 min Total number of Virtual BH Treatment Team Plan encounters: 1/4  Treatment Team Attendees: Dr. Vanetta Shawl and Esmond Harps  Collaborative Care Psychiatric Consultant Case Review    Assessment/Provisional Diagnosis Jaime [name] is a 37 y.o. 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 The patient is referred for depression and anxiety   # Insomnia She reports worsening insomnia over the past two weeks, which coincided with the uptitration of venlafaxine. While the cause may be multifactorial, I recommend considering a reduction in the dose of venlafaxine or cross-tapering to another antidepressant to see if this mitigates the possible adverse reaction.  A sleep study may also be considered if clinically indicated.   # MDD, recurrent, moderate The patient experiences depressive symptoms and anxiety in the context of the following stressors, although she expressed more concern about her sleep, according to the [facility] specialist. She has tried several antidepressants in the past and is currently experiencing a possible reaction to venlafaxine. I recommend a referral to psychiatry, preferably for an in-person visit, as engagement is likely to be more beneficial for this patient.   Recommendation Referral to psychiatry Consider tapering down venlafaxine Consider sleep study if clinically indicated [facility] specialist will follow up to review her current methadone treatment and assess any additional needs from collaborative care. We will also determine if the patient is interested in seeing a traditional therapist for CBT.   Goals, Interventions and  Follow-up Plan Goals: improve sleep and decrease depression Interventions: CBT Cognitive Behavioral Therapy Medication Monitoring Medication Management Recommendations: Consider tapering down venlafaxine Follow-up Plan:  [facility] specialist will follow up to review her current methadone treatment and assess any additional needs from collaborative care. We will also determine if the patient is interested in seeing a traditional therapist for CBT.  History of the present illness Presenting Problem/Current Symptoms:  The patient is a 37 year old female with a history of severe anxiety and chronic depression. She presents with a chief complaint of insomnia that began approximately two weeks ago, resulting in her getting only one to two hours of sleep per night since then. This lack of sleep has exacerbated her depressive symptoms, causing low energy, irritability, loss of interest in activities, and increased anxiety.   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 Since this incident, she has had persistent feelings of panic and worry, although these symptoms have been present chronically throughout her life. The memory loss from the diabetic coma has affected her ability to recall many details, but she remembers always feeling depressed. She does not recall any trauma history or specific events that might have triggered her depression and anxiety beyond the memory loss related to the diabetic coma.   The onset of her insomnia coincided with an increase in her Effexor dosage two weeks ago, which she believes might be causing side effects affecting her sleep. Before this adjustment, she had found some relief from her depressive symptoms with Effexor. Her primary care provider advised her to take the medication early in the morning to minimize its impact on sleep, which she has been doing, but she  still experiences significant insomnia.   The  patient has no history of substance abuse, psychiatric hospitalizations, or suicide attempts, and she currently has no thoughts of self-harm or suicidality. Despite her chronic symptoms, she describes her baseline mood as consistently depressed, a state she has learned to live with over time.   The purpose of her visit today is to address the increased depression and insomnia. She is seeking potential medication options to help with her sleep and is interested in exploring general therapy and counseling. Additionally, she is considering psychiatric evaluation, as she has struggled with these symptoms for much of her life and has never found adequate relief.  Screenings PHQ-9 Assessments:     07/02/2023    2:22 PM 05/25/2023    3:48 PM 04/27/2023    5:17 PM  Depression screen PHQ 2/9  Decreased Interest 3 3 3   Down, Depressed, Hopeless 3 3 3   PHQ - 2 Score 6 6 6   Altered sleeping 3 3 3   Tired, decreased energy 3 3 3   Change in appetite 3 3 3   Feeling bad or failure about yourself  3 3 3   Trouble concentrating 3 3 3   Moving slowly or fidgety/restless 3 3 3   Suicidal thoughts 0 0 3  PHQ-9 Score 24 24 27   Difficult doing work/chores Very difficult Extremely dIfficult Extremely dIfficult   GAD-7 Assessments:     05/25/2023    3:48 PM 04/27/2023    5:18 PM 02/05/2023    4:05 PM 02/02/2023    3:18 PM  GAD 7 : Generalized Anxiety Score  Nervous, Anxious, on Edge 3 3 3 3   Control/stop worrying 3 3 3 3   Worry too much - different things 3 3 3 3   Trouble relaxing 3 3 3 3   Restless 3 3 3 3   Easily annoyed or irritable 3 3 3 3   Afraid - awful might happen 3 3 3 3   Total GAD 7 Score 21 21 21 21   Anxiety Difficulty Extremely difficult Extremely difficult Extremely difficult Extremely difficult    Past Medical History Past Medical History:  Diagnosis Date   Acute encephalopathy 08/31/2020   Adjustment disorder with depressed mood 09/07/2020   Anxiety    Asthma    Chronic abdominal pain     Chronic back pain    Chronic prescription opiate use 02/16/2022   Cirrhosis (HCC)    Cirrhosis (HCC)    COPD (chronic obstructive pulmonary disease) (HCC)    Depression    Diabetes mellitus without complication (HCC)    Diabetes mellitus, type II (HCC)    Diverticulosis    DVT (deep venous thrombosis) (HCC) 02/04/2023   taking lovenox prior to procedure   Fatty liver 03/07/2020   GERD (gastroesophageal reflux disease)    Hepatitis C    Hernia, abdominal    Hyperglycemia due to type 2 diabetes mellitus (HCC) 05/24/2020   Hyperlipidemia    IBS (irritable bowel syndrome) 02/16/2022   Insomnia    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)    Lupus (HCC)    Migraine headache    Morbid obesity (HCC) 05/24/2020   Neuropathy    Nocturnal seizures (HCC)    Pain of upper abdomen 07/28/2021   Peptic ulcer    Spleen enlarged    Splenomegaly     Vital signs: There were no vitals filed for this visit.  Allergies:  Allergies as of 07/07/2023 - Review Complete 05/31/2023  Allergen Reaction Noted   Iodine-131 Anaphylaxis, Shortness  Of Breath, and Swelling 05/01/2018   Ivp dye [iodinated contrast media] Anaphylaxis 10/07/2013   Ketorolac tromethamine Shortness Of Breath 12/22/2010   Tylenol [acetaminophen] Other (See Comments) 01/23/2019   Vancomycin Swelling 05/15/2018   Gabapentin Itching 05/28/2020   Nsaids Other (See Comments) 12/22/2010   Suboxone [buprenorphine hcl-naloxone hcl]  05/28/2020    Medication History Current medications:  Outpatient Encounter Medications as of 07/07/2023  Medication Sig   Accu-Chek Softclix Lancets lancets USE TO TEST BLOOD SUGAR 4 TIMES A DAY.   albuterol (VENTOLIN HFA) 108 (90 Base) MCG/ACT inhaler Inhale 2 puffs into the lungs every 6 (six) hours as needed for wheezing or shortness of breath.   Continuous Glucose Sensor (DEXCOM G6 SENSOR) MISC Change sensor every 10 days.   Continuous Glucose Transmitter (DEXCOM G6  TRANSMITTER) MISC USE AS DIRECTED.   cyclobenzaprine (FLEXERIL) 10 MG tablet Take 1 tablet (10 mg total) by mouth 3 (three) times daily as needed for muscle spasms.   enoxaparin (LOVENOX) 100 MG/ML injection Inject 1 mL (100 mg total) into the skin every 12 (twelve) hours.   famotidine (PEPCID) 20 MG tablet Take 1 tablet (20 mg total) by mouth 2 (two) times daily.   ferrous sulfate 325 (65 FE) MG EC tablet Take 1 tablet (325 mg total) by mouth daily with breakfast.   glipiZIDE (GLUCOTROL XL) 5 MG 24 hr tablet Take 1 tablet (5 mg total) by mouth daily with breakfast.   glucose blood (ACCU-CHEK GUIDE) test strip Use as instructed to check blood glucose four times daily   hydrOXYzine (VISTARIL) 50 MG capsule Take 1 capsule (50 mg total) by mouth 3 (three) times daily as needed.   hyoscyamine (ANASPAZ) 0.125 MG TBDP disintergrating tablet TAKE 1 TABLET UNDER THE TONGUE THREE TIMES A DAY AS NEEDED FOR BLADDER SPASMS OR CRAMPING.   Insulin Pen Needle (GLOBAL EASE INJECT PEN NEEDLES) 31G X 8 MM MISC Use 1 pen needle 4 times daily for insulin.   Insulin Pen Needle (PEN NEEDLES) 32G X 6 MM MISC 1 each by Does not apply route 3 (three) times daily.   insulin regular human CONCENTRATED (HUMULIN R U-500 KWIKPEN) 500 UNIT/ML KwikPen Inject 90 Units into the skin 3 (three) times daily with meals.   lidocaine (XYLOCAINE) 5 % ointment Apply 1 Application topically as needed.   LINZESS 290 MCG CAPS capsule Take 290 mcg by mouth daily.   lisinopril (ZESTRIL) 5 MG tablet TAKE 1 TABLET ONCE DAILY.   lubiprostone (AMITIZA) 24 MCG capsule Take 1 capsule (24 mcg total) by mouth 2 (two) times daily with a meal.   metFORMIN (GLUCOPHAGE) 1000 MG tablet Take 1 tablet (1,000 mg total) by mouth 2 (two) times daily with a meal.   omeprazole (PRILOSEC) 40 MG capsule TAKE 1 CAPSULE BY MOUTH TWICE DAILY.   ondansetron (ZOFRAN) 4 MG tablet Take 1 tablet (4 mg total) by mouth every 8 (eight) hours as needed for nausea or vomiting.    pantoprazole (PROTONIX) 20 MG tablet Take 1 tablet by mouth once daily   polyethylene glycol-electrolytes (TRILYTE) 420 g solution Take 4,000 mLs by mouth as directed.   rosuvastatin (CRESTOR) 10 MG tablet Take 1 tablet (10 mg total) by mouth daily.   sucralfate (CARAFATE) 1 GM/10ML suspension Take 10 mLs (1 g total) by mouth 4 (four) times daily.   tirzepatide (MOUNJARO) 7.5 MG/0.5ML Pen Inject 7.5 mg into the skin once a week.   venlafaxine XR (EFFEXOR XR) 75 MG 24 hr capsule Take 1 capsule (  75 mg total) by mouth daily with breakfast.   Vitamin D, Ergocalciferol, (DRISDOL) 1.25 MG (50000 UNIT) CAPS capsule Take 1 capsule (50,000 Units total) by mouth every 7 (seven) days. (Patient taking differently: Take 50,000 Units by mouth every Tuesday.)   Facility-Administered Encounter Medications as of 07/07/2023  Medication   etonogestrel (NEXPLANON) implant 68 mg     Scribe for Treatment Team: Reuel Boom

## 2023-07-14 ENCOUNTER — Ambulatory Visit: Payer: MEDICAID

## 2023-07-14 DIAGNOSIS — E119 Type 2 diabetes mellitus without complications: Secondary | ICD-10-CM | POA: Diagnosis not present

## 2023-07-14 LAB — HM DIABETES EYE EXAM

## 2023-07-14 NOTE — Progress Notes (Signed)
Jaime [name] arrived 07/14/2023 and has given verbal consent to obtain images and complete their overdue diabetic retinal screening.  The images have been sent to an ophthalmologist or optometrist for review and interpretation.  Results will be sent back to Del Newman Nip, [place], FNP for review.  Patient has been informed they will be contacted when we receive the results via telephone or MyChart

## 2023-07-16 ENCOUNTER — Ambulatory Visit: Payer: Self-pay | Admitting: Professional Counselor

## 2023-07-19 ENCOUNTER — Telehealth: Payer: Self-pay | Admitting: Physician Assistant

## 2023-07-19 DIAGNOSIS — I82432 Acute embolism and thrombosis of left popliteal vein: Secondary | ICD-10-CM

## 2023-07-19 MED ORDER — ENOXAPARIN SODIUM 100 MG/ML IJ SOSY
100.0000 mg | PREFILLED_SYRINGE | Freq: Two times a day (BID) | INTRAMUSCULAR | 3 refills | Status: DC
Start: 2023-07-19 — End: 2023-09-13

## 2023-07-19 NOTE — Telephone Encounter (Signed)
Patient was scheduled for follow-up visit tomorrow, but she has no-showed x2 for repeat venous US of left lower extremity.  Since the primary purpose of tomorrow's visit was to review ultrasound results and any changes to the plan based on those results, we will cancel tomorrow's visit.  I did call and speak with the patient regarding this.  I have reiterated to her the importance of the following: - Rescheduling venous US to determine if she has any residual clot - Continuing Lovenox as prescribed - Scheduling an appointment with GYN for removal of Nexplanon  Patient verbalized understanding and agreement with the above.  She does not have any other concerns at this time, and reports that she will reschedule ultrasound/hematology visit, continue Lovenox, and make an appointment with her GYN.  Carnella Guadalajara, PA-C 07/19/23 7:14 PM

## 2023-07-20 ENCOUNTER — Inpatient Hospital Stay: Payer: MEDICAID | Admitting: Physician Assistant

## 2023-07-20 ENCOUNTER — Other Ambulatory Visit: Payer: Self-pay | Admitting: Family Medicine

## 2023-07-20 DIAGNOSIS — F322 Major depressive disorder, single episode, severe without psychotic features: Secondary | ICD-10-CM

## 2023-07-22 ENCOUNTER — Ambulatory Visit (HOSPITAL_COMMUNITY): Admission: RE | Admit: 2023-07-22 | Payer: MEDICAID | Source: Ambulatory Visit

## 2023-07-26 ENCOUNTER — Ambulatory Visit (INDEPENDENT_AMBULATORY_CARE_PROVIDER_SITE_OTHER): Payer: MEDICAID | Admitting: Nurse Practitioner

## 2023-07-26 ENCOUNTER — Encounter: Payer: Self-pay | Admitting: Nurse Practitioner

## 2023-07-26 VITALS — BP 103/65 | HR 82 | Ht 69.0 in | Wt 233.6 lb

## 2023-07-26 DIAGNOSIS — E1165 Type 2 diabetes mellitus with hyperglycemia: Secondary | ICD-10-CM | POA: Diagnosis not present

## 2023-07-26 DIAGNOSIS — Z794 Long term (current) use of insulin: Secondary | ICD-10-CM

## 2023-07-26 DIAGNOSIS — Z7985 Long-term (current) use of injectable non-insulin antidiabetic drugs: Secondary | ICD-10-CM

## 2023-07-26 DIAGNOSIS — E782 Mixed hyperlipidemia: Secondary | ICD-10-CM

## 2023-07-26 DIAGNOSIS — Z7984 Long term (current) use of oral hypoglycemic drugs: Secondary | ICD-10-CM | POA: Diagnosis not present

## 2023-07-26 LAB — POCT GLYCOSYLATED HEMOGLOBIN (HGB A1C): Hemoglobin A1C: 9.4 % — AB (ref 4.0–5.6)

## 2023-07-26 MED ORDER — TIRZEPATIDE 2.5 MG/0.5ML ~~LOC~~ SOAJ: 2.5000 mg | SUBCUTANEOUS | 0 refills | Status: DC

## 2023-07-26 MED ORDER — TIRZEPATIDE 5 MG/0.5ML ~~LOC~~ SOAJ: 5.0000 mg | SUBCUTANEOUS | 1 refills | Status: DC

## 2023-07-26 NOTE — Progress Notes (Signed)
07/26/2023, 11:08 AM     Endocrinology Follow Up Visit  Subjective:    Patient ID: Jaime [name], female    DOB: 04-15-1986.  Jaime [name] is being seen in follow up for management of currently uncontrolled symptomatic diabetes requested by  Toma Deiters, MD.   Past Medical History:  Diagnosis Date   Acute encephalopathy 08/31/2020   Adjustment disorder with depressed mood 09/07/2020   Anxiety    Asthma    Chronic abdominal pain    Chronic back pain    Chronic prescription opiate use 02/16/2022   Cirrhosis (HCC)    Cirrhosis (HCC)    COPD (chronic obstructive pulmonary disease) (HCC)    Depression    Diabetes mellitus without complication (HCC)    Diabetes mellitus, type II (HCC)    Diverticulosis    DVT (deep venous thrombosis) (HCC) 02/04/2023   taking lovenox prior to procedure   Fatty liver 03/07/2020   GERD (gastroesophageal reflux disease)    Hepatitis C    Hernia, abdominal    Hyperglycemia due to type 2 diabetes mellitus (HCC) 05/24/2020   Hyperlipidemia    IBS (irritable bowel syndrome) 02/16/2022   Insomnia    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)    Lupus (HCC)    Migraine headache    Morbid obesity (HCC) 05/24/2020   Neuropathy    Nocturnal seizures (HCC)    Pain of upper abdomen 07/28/2021   Peptic ulcer    Spleen enlarged    Splenomegaly     Past Surgical History:  Procedure Laterality Date   BIOPSY  11/25/2021   Procedure: BIOPSY;  Surgeon: Dolores Frame, MD;  [facility];  Service: Gastroenterology;;   CHOLECYSTECTOMY     COLONOSCOPY WITH PROPOFOL N/A 11/25/2021   Procedure: COLONOSCOPY WITH PROPOFOL;  Surgeon: Dolores Frame, MD;  Location: [facility];  Service: Gastroenterology;  Laterality: N/A;  805   COLONOSCOPY WITH PROPOFOL N/A 03/31/2022   Procedure: COLONOSCOPY WITH PROPOFOL;  Surgeon:  Dolores Frame, MD;  Location: [facility];  Service: Gastroenterology;  Laterality: N/A;  730   ESOPHAGOGASTRODUODENOSCOPY  06/2020   done at [facility], candida in upper esophagus (treated with diflucan), ulcerative esophagitis at GE junction, gastritis in stomach, single ulcer in duodenal bulb, with duodenal mucosa showing no abnormality. No presence of varices   ESOPHAGOGASTRODUODENOSCOPY (EGD) WITH PROPOFOL N/A 11/25/2021   Procedure: ESOPHAGOGASTRODUODENOSCOPY (EGD) WITH PROPOFOL;  Surgeon: Dolores Frame, MD;  [facility];  Service: Gastroenterology;  Laterality: N/A;   ESOPHAGOGASTRODUODENOSCOPY (EGD) WITH PROPOFOL N/A 03/31/2022   Procedure: ESOPHAGOGASTRODUODENOSCOPY (EGD) WITH PROPOFOL;  Surgeon: Dolores Frame, MD;  [facility];  Service: Gastroenterology;  Laterality: N/A;    Social History   Socioeconomic History   Marital status: Married    Spouse name: Not on file   Number of children: Not on file   Years of education: Not on file   Highest education level: Not on file  Occupational History   Not on file  Tobacco Use   Smoking status: Every Day  Current packs/day: 0.50    Types: Cigarettes    Passive exposure: Current   Smokeless tobacco: Never  Vaping Use   Vaping status: Former   Substances: Nicotine, Flavoring  Substance and Sexual Activity   Alcohol use: No   Drug use: No   Sexual activity: Not Currently    Birth control/protection: Implant  Other Topics Concern   Not on file  Social History Narrative   Not on file   Social Determinants of Health   Financial Resource Strain: Medium Risk (05/09/2020)   Overall Financial Resource Strain (CARDIA)    Difficulty of Paying Living Expenses: Somewhat hard  Food Insecurity: Food Insecurity Present (01/14/2023)   Hunger Vital Sign    Worried About Running Out of Food in the Last Year: Sometimes true    Ran Out of Food in the Last Year: Never true   Transportation Needs: No Transportation Needs (01/14/2023)   PRAPARE - Administrator, Civil Service (Medical): No    Lack of Transportation (Non-Medical): No  Physical Activity: Insufficiently Active (05/09/2020)   Exercise Vital Sign    Days of Exercise per Week: 1 day    Minutes of Exercise per Session: 20 min  Stress: Stress Concern Present (05/09/2020)   [employer] of Occupational Health - Occupational Stress Questionnaire    Feeling of Stress : Very much  Social Connections: Unknown (03/04/2022)   [facility] from [facility]   Social Network    Social Network: Not on file    Family History  Problem Relation Age of Onset   Hyperlipidemia Maternal Grandfather     Outpatient Encounter Medications as of 07/26/2023  Medication Sig   Accu-Chek Softclix Lancets lancets USE TO TEST BLOOD SUGAR 4 TIMES A DAY.   albuterol (VENTOLIN HFA) 108 (90 Base) MCG/ACT inhaler Inhale 2 puffs into the lungs every 6 (six) hours as needed for wheezing or shortness of breath.   Continuous Glucose Sensor (DEXCOM G6 SENSOR) MISC Change sensor every 10 days.   Continuous Glucose Transmitter (DEXCOM G6 TRANSMITTER) MISC USE AS DIRECTED.   cyclobenzaprine (FLEXERIL) 10 MG tablet Take 1 tablet (10 mg total) by mouth 3 (three) times daily as needed for muscle spasms.   enoxaparin (LOVENOX) 100 MG/ML injection Inject 1 mL (100 mg total) into the skin every 12 (twelve) hours.   famotidine (PEPCID) 20 MG tablet Take 1 tablet (20 mg total) by mouth 2 (two) times daily.   ferrous sulfate 325 (65 FE) MG EC tablet Take 1 tablet (325 mg total) by mouth daily with breakfast.   glipiZIDE (GLUCOTROL XL) 5 MG 24 hr tablet Take 1 tablet (5 mg total) by mouth daily with breakfast.   glucose blood (ACCU-CHEK GUIDE) test strip Use as instructed to check blood glucose four times daily   hydrOXYzine (VISTARIL) 50 MG capsule Take 1 capsule (50 mg total) by mouth 3 (three) times daily as needed.    hyoscyamine (ANASPAZ) 0.125 MG TBDP disintergrating tablet TAKE 1 TABLET UNDER THE TONGUE THREE TIMES A DAY AS NEEDED FOR BLADDER SPASMS OR CRAMPING.   Insulin Pen Needle (GLOBAL EASE INJECT PEN NEEDLES) 31G X 8 MM MISC Use 1 pen needle 4 times daily for insulin.   Insulin Pen Needle (PEN NEEDLES) 32G X 6 MM MISC 1 each by Does not apply route 3 (three) times daily.   insulin regular human CONCENTRATED (HUMULIN R U-500 KWIKPEN) 500 UNIT/ML KwikPen Inject 90 Units into the skin 3 (three) times daily with meals.  lidocaine (XYLOCAINE) 5 % ointment Apply 1 Application topically as needed.   LINZESS 290 MCG CAPS capsule Take 290 mcg by mouth daily.   lisinopril (ZESTRIL) 5 MG tablet TAKE 1 TABLET ONCE DAILY.   lubiprostone (AMITIZA) 24 MCG capsule Take 1 capsule (24 mcg total) by mouth 2 (two) times daily with a meal.   metFORMIN (GLUCOPHAGE) 1000 MG tablet Take 1 tablet (1,000 mg total) by mouth 2 (two) times daily with a meal.   omeprazole (PRILOSEC) 40 MG capsule TAKE 1 CAPSULE BY MOUTH TWICE DAILY.   ondansetron (ZOFRAN) 4 MG tablet Take 1 tablet (4 mg total) by mouth every 8 (eight) hours as needed for nausea or vomiting.   pantoprazole (PROTONIX) 20 MG tablet Take 1 tablet by mouth once daily   polyethylene glycol-electrolytes (TRILYTE) 420 g solution Take 4,000 mLs by mouth as directed.   rosuvastatin (CRESTOR) 10 MG tablet Take 1 tablet (10 mg total) by mouth daily.   sucralfate (CARAFATE) 1 GM/10ML suspension Take 10 mLs (1 g total) by mouth 4 (four) times daily.   tirzepatide [facility]) 2.5 MG/0.5ML Pen Inject 2.5 mg into the skin once a week.   tirzepatide [facility]) 5 MG/0.5ML Pen Inject 5 mg into the skin once a week.   venlafaxine XR (EFFEXOR XR) 75 MG 24 hr capsule Take 1 capsule (75 mg total) by mouth daily with breakfast.   Vitamin D, Ergocalciferol, (DRISDOL) 1.25 MG (50000 UNIT) CAPS capsule Take 1 capsule (50,000 Units total) by mouth every 7 (seven) days. (Patient taking  differently: Take 50,000 Units by mouth every Tuesday.)   [DISCONTINUED] tirzepatide (MOUNJARO) 7.5 MG/0.5ML Pen Inject 7.5 mg into the skin once a week. (Patient not taking: Reported on 07/26/2023)   Facility-Administered Encounter Medications as of 07/26/2023  Medication   etonogestrel (NEXPLANON) implant 68 mg    ALLERGIES: Allergies  Allergen Reactions   Iodine-131 Anaphylaxis, Shortness Of Breath and Swelling   Ivp Dye [Iodinated Contrast Media] Anaphylaxis    Skin gets very red, unable to walk    Ketorolac Tromethamine Shortness Of Breath   Tylenol [Acetaminophen] Other (See Comments)    Due to cirrhosis    Vancomycin Swelling    Facial swelling,    Gabapentin Itching   Nsaids Other (See Comments)    Flares ulcers   Suboxone [Buprenorphine Hcl-Naloxone Hcl]     Withdrawal symptoms     VACCINATION STATUS: Immunization History  Administered Date(s) Administered   Influenza Inj Mdck Quad Pf 08/19/2022   Influenza,inj,Quad PF,6+ Mos 07/18/2016, 07/07/2018, 07/04/2019, 07/27/2020   Influenza-Unspecified 08/11/2017, 07/19/2018   Moderna SARS-COV2 Booster Vaccination 04/09/2022   Moderna Sars-Covid-2 Vaccination 03/07/2020, 04/04/2020   Pneumococcal Polysaccharide-23 07/18/2016, 07/07/2018, 07/04/2019, 08/01/2020   Tdap 07/04/2019    Diabetes She presents for her follow-up diabetic visit. She has type 2 diabetes mellitus. Onset time: She was diagnosed at approximate age of 28 years. Her disease course has been worsening. There are no hypoglycemic associated symptoms. Pertinent negatives for hypoglycemia include no headaches or pallor. (Had symptoms of hypoglycemia when glucose dropped to 117.) Associated symptoms include foot paresthesias and weight loss. Pertinent negatives for diabetes include no blurred vision, no chest pain, no fatigue, no polydipsia, no polyphagia and no polyuria. There are no hypoglycemic complications. (History of unresponsiveness requiring EMS and  hospitalization- none recent) Symptoms are improving. Diabetic complications include heart disease and peripheral neuropathy. Risk factors for coronary artery disease include diabetes mellitus, obesity, sedentary lifestyle, dyslipidemia and hypertension. Current diabetic treatment includes oral agent (dual  therapy) and intensive insulin program (been off mounjaro for a while due to drug availability). She is compliant with treatment most of the time (t). Her weight is decreasing steadily. She is following a low salt and generally healthy diet. When asked about meal planning, she reported none. She has had a previous visit with a dietitian. She participates in exercise intermittently. Her home blood glucose trend is increasing steadily. Her breakfast blood glucose range is generally >200 mg/dl. Her lunch blood glucose range is generally >200 mg/dl. Her dinner blood glucose range is generally >200 mg/dl. Her bedtime blood glucose range is generally >200 mg/dl. Her overall blood glucose range is >200 mg/dl. (She presents today, accompanied by her mother, with her CGM showing greatly above target glycemic profile overall.  Her POCT A1c today is 9.4%, increasing from last A1c of 6%.  She has completely cut out sweet tea.  She denies any recent hypoglycemia.  She does note she has not been able to get [facility] due to drug shortage, has been out for quite some time.) An ACE inhibitor/angiotensin II receptor blocker is being taken. She does not see a podiatrist.Eye exam is not current.  Hypertension This is a chronic problem. The current episode started more than 1 month ago. The problem has been resolved since onset. The problem is controlled. Associated symptoms include peripheral edema. Pertinent negatives include no blurred vision, chest pain, headaches, palpitations or shortness of breath. There are no associated agents to hypertension. Risk factors for coronary artery disease include diabetes mellitus, dyslipidemia,  obesity and sedentary lifestyle. Past treatments include diuretics and beta blockers. The current treatment provides no improvement. There are no compliance problems.     Review of systems  Constitutional: +decreasing body weight,  current Body mass index is 34.5 kg/m. , + fatigue, no subjective hyperthermia, no subjective hypothermia Eyes: no blurry vision, no xerophthalmia ENT: no sore throat, no nodules palpated in throat, no dysphagia/odynophagia, no hoarseness Cardiovascular: no chest pain, no shortness of breath, no palpitations, no leg swelling Respiratory: no cough, no shortness of breath Gastrointestinal: no nausea/vomiting/diarrhea Musculoskeletal: no muscle/joint aches Skin: no rashes, no hyperemia Neurological: no tremors, no numbness, no tingling, no dizziness Psychiatric: + depression-greatly improved, no anxiety  Objective:     BP 103/65 (BP Location: Left Arm, Patient Position: Sitting, Cuff Size: Large)   Pulse 82   Ht 5\' 9"  (1.753 m)   Wt 233 lb 9.6 oz (106 kg)   LMP  (LMP Unknown)   BMI 34.50 kg/m   Wt Readings from Last 3 Encounters:  07/26/23 233 lb 9.6 oz (106 kg)  05/31/23 241 lb (109.3 kg)  05/25/23 241 lb (109.3 kg)    BP Readings from Last 3 Encounters:  07/26/23 103/65  05/31/23 117/69  05/25/23 117/69     Physical Exam- Limited  Constitutional:  Body mass index is 34.5 kg/m. , not in acute distress, normal state of mind Eyes:  EOMI, no exophthalmos Musculoskeletal: no gross deformities, strength intact in all four extremities, no gross restriction of joint movements Skin:  no rashes, no hyperemia Neurological: no tremor with outstretched hands   Diabetic Foot Exam - Simple   No data filed    CMP ( most recent) CMP     Component Value Date/Time   NA 134 (L) 05/14/2023 1312   NA 143 01/01/2023 1408   K 4.0 05/14/2023 1312   CL 106 05/14/2023 1312   CO2 22 05/14/2023 1312   GLUCOSE 200 (H) 05/14/2023 1312  BUN 10 05/14/2023 1312    BUN 11 01/01/2023 1408   CREATININE 0.61 05/14/2023 1312   CREATININE 0.66 05/18/2022 1113   CALCIUM 8.4 (L) 05/14/2023 1312   PROT 6.5 05/14/2023 1312   PROT 5.8 (L) 01/01/2023 1408   ALBUMIN 3.3 (L) 05/14/2023 1312   ALBUMIN 3.3 (L) 01/01/2023 1408   AST 38 05/14/2023 1312   ALT 40 05/14/2023 1312   ALKPHOS 109 05/14/2023 1312   BILITOT 1.1 05/14/2023 1312   BILITOT 1.3 (H) 01/01/2023 1408   GFRNONAA >60 05/14/2023 1312   GFRAA >60 08/01/2020 0704     Diabetic Labs (most recent): Lab Results  Component Value Date   HGBA1C 9.4 (A) 07/26/2023   HGBA1C 9.3 (H) 01/01/2023   HGBA1C 11.1 (A) 09/29/2022   MICROALBUR 30 12/10/2021   MICROALBUR 80 02/26/2021     Lipid Panel ( most recent) Lipid Panel     Component Value Date/Time   CHOL 134 01/01/2023 1408   TRIG 264 (H) 01/01/2023 1408   HDL 35 (L) 01/01/2023 1408   CHOLHDL 3.8 01/01/2023 1408   LDLCALC 57 01/01/2023 1408   LABVLDL 42 (H) 01/01/2023 1408      Lab Results  Component Value Date   TSH 2.280 01/01/2023   TSH 2.700 12/03/2021   TSH 0.635 09/08/2020   TSH 0.993 07/30/2020   TSH 1.990 07/23/2020   TSH 1.690 08/22/2010   FREET4 0.97 01/01/2023   FREET4 0.70 (L) 12/03/2021   FREET4 0.72 (L) 07/23/2020   FREET4 1.11 08/22/2010      Assessment & Plan:   1) Type 2 diabetes mellitus with hyperglycemia, without long-term current use of insulin (HCC)  - BAWI BISHARA has currently uncontrolled symptomatic type 2 DM since  37 years of age.  She presents today, accompanied by her mother, with her CGM showing greatly above target glycemic profile overall.  Her POCT A1c today is 9.4%, increasing from last A1c of 6%.  She has completely cut out sweet tea.  She denies any recent hypoglycemia.  She does note she has not been able to get [facility] due to drug shortage, has been out for quite some time.  - I had a long discussion with her about the progressive nature of diabetes and the pathology behind its  complications.  -her diabetes is complicated by obesity/sedentary life and she remains at a high risk for more acute and chronic complications which include CAD, CVA, CKD, retinopathy, and neuropathy. These are all discussed in detail with her.  - Nutritional counseling repeated at each appointment due to patients tendency to fall back in to old habits.  - The patient admits there is a room for improvement in their diet and drink choices. -  Suggestion is made for the patient to avoid simple carbohydrates from their diet including Cakes, Sweet Desserts / Pastries, Ice Cream, Soda (diet and regular), Sweet Tea, Candies, Chips, Cookies, Sweet Pastries, Store Bought Juices, Alcohol in Excess of 1-2 drinks a day, Artificial Sweeteners, Coffee Creamer, and "Sugar-free" Products. This will help patient to have stable blood glucose profile and potentially avoid unintended weight gain.   - I encouraged the patient to switch to unprocessed or minimally processed complex starch and increased protein intake (animal or plant source), fruits, and vegetables.   - Patient is advised to stick to a routine mealtimes to eat 3 meals a day and avoid unnecessary snacks (to snack only to correct hypoglycemia).  - I have approached her with the following individualized  plan to manage  her diabetes and patient agrees:   -She is advised to increase her U500 to 100 units TID with meals if glucose is above 90 and she is eating but to be more consistent with taking it.  She can continue her Metformin 1000 mg po twice daily with meals and Glipizide 5 mg XL daily with breakfast.  Will restart her Mounjaro 2.5 mg SQ weekly x 1 month, then increase to 5 mg SQ weekly thereafter.  Once she is on the 5 mg for 2 weeks, she can reduce her U500 back to 90 units TID.  -She is encouraged to continue to monitor glucose 4 times daily (using her CGM), before meals and before bed and log on the clinic sheets provided.  They are instructed to  call the clinic if she has readings less than 70 or greater than 300 for 3 tests in a row.     - Specific targets for  A1c;  LDL, HDL,  and Triglycerides were discussed with the patient.  2) Blood Pressure /Hypertension: Her blood pressure is controlled to target.  She is advised to continue Lasix 80 mg po daily, continue Lisinopril 5 mg po daily, continue Propanolol 10 mg po TID, and Aldactone 100 mg o daily  3) Lipids/Hyperlipidemia:   Review of her recent lipid profile from 03/11/23 shows controlled LDL at 61 and elevated triglycerides of 255 (slowly improving).  She is advised to continue Crestor 10 mg po daily at bedtime.  Side effects and precautions discussed with her.    4)  Weight/Diet:  Her Body mass index is 34.5 kg/m.  -   clearly complicating her diabetes care.   she is  a candidate for modest weight loss. I discussed with her the fact that loss of 5 - 10% of her  current body weight will have the most impact on her diabetes management.  Exercise, and detailed carbohydrates information provided  -  detailed on discharge instructions.  5) Vitamin D deficiency Her most recent vitamin d level on 12/03/21 was 9.8.  I discussed and initiated replenishment with Ergocalciferol 50000 units po weekly- she is still taking this.  6) Chronic Care/Health Maintenance: -she is on ACE and statin medications and is encouraged to initiate and continue to follow up with Ophthalmology, Dentist,  Podiatrist at least yearly or according to recommendations, and advised to stay away from smoking. I have recommended yearly flu vaccine and pneumonia vaccine at least every 5 years; moderate intensity exercise for up to 150 minutes weekly; and  sleep for at least 7 hours a day.  - she is advised to maintain close follow up with Hasanaj, Myra Gianotti, MD for primary care needs, as well as her other providers for optimal and coordinated care.     I spent  46  minutes in the care of the patient today including review  of labs from CMP, Lipids, Thyroid Function, Hematology (current and previous including abstractions from other facilities); face-to-face time discussing  her blood glucose readings/logs, discussing hypoglycemia and hyperglycemia episodes and symptoms, medications doses, her options of short and long term treatment based on the latest standards of care / guidelines;  discussion about incorporating lifestyle medicine;  and documenting the encounter. Risk reduction counseling performed per USPSTF guidelines to reduce obesity and cardiovascular risk factors.     Please refer to Patient Instructions for Blood Glucose Monitoring and Insulin/Medications Dosing Guide"  in media tab for additional information. Please  also refer to "  Patient Self Inventory" in the Media  tab for reviewed elements of pertinent patient history.  Charlyn Minerva participated in the discussions, expressed understanding, and voiced agreement with the above plans.  All questions were answered to her satisfaction. she is encouraged to contact clinic should she have any questions or concerns prior to her return visit.    Follow up plan: - Return in about 3 months (around 10/25/2023) for Diabetes F/U with A1c in office, No previsit labs, Bring meter and logs.    Ronny Bacon, [facility] [address] [place], [place] [address] Phone: [phone] Fax: [phone]   07/26/2023, 11:08 AM

## 2023-07-28 ENCOUNTER — Ambulatory Visit: Payer: MEDICAID | Admitting: Family Medicine

## 2023-07-29 ENCOUNTER — Encounter: Payer: Self-pay | Admitting: Family Medicine

## 2023-08-02 ENCOUNTER — Telehealth: Payer: Self-pay

## 2023-08-02 ENCOUNTER — Other Ambulatory Visit (HOSPITAL_COMMUNITY): Payer: Self-pay

## 2023-08-02 NOTE — Telephone Encounter (Signed)
Pharmacy Patient Advocate Encounter   Received notification from CoverMyMeds that prior authorization for Mounjaro 2.5 is required/requested.  Per test claim: PA required; PA submitted to [facility] via CoverMyMeds Key/confirmation #/[id number] Status is pending

## 2023-08-04 NOTE — Telephone Encounter (Signed)
Patient was called and a message was left letting the patient know that she had been approved for the [facility].

## 2023-08-04 NOTE — Telephone Encounter (Signed)
Pharmacy Patient Advocate Encounter  Received notification from [facility] that Prior Authorization for Jaime [name] 2.5 has been APPROVED through 08/01/2024   PA #/Case ID/Reference #: [id number]

## 2023-08-05 ENCOUNTER — Inpatient Hospital Stay: Payer: MEDICAID | Attending: Oncology | Admitting: Oncology

## 2023-08-09 ENCOUNTER — Telehealth: Payer: Self-pay | Admitting: Family Medicine

## 2023-08-09 NOTE — Telephone Encounter (Signed)
Patient has had several no shows so I am okay with dismissing this patient

## 2023-08-09 NOTE — Telephone Encounter (Signed)
Good Afternoon,  Patient's spouse LVM to try and get pt scheduled- says she has been feeling "under the weather." Patient has 3 no shows as of right now, please advise on scheduling pt.   Thank you

## 2023-08-10 ENCOUNTER — Other Ambulatory Visit: Payer: Self-pay | Admitting: Nurse Practitioner

## 2023-08-12 ENCOUNTER — Encounter: Payer: Self-pay | Admitting: Family Medicine

## 2023-08-12 NOTE — Telephone Encounter (Signed)
Dismissal letter sent to patient.

## 2023-08-24 NOTE — Congregational Nurse Program (Unsigned)
When the patient arrived at the clinic, she expressed concerns about experiencing pain, redness, and soreness in her left knee. I advised her to contact her primary care physician, and she promptly scheduled an appointment.

## 2023-08-26 DIAGNOSIS — I80202 Phlebitis and thrombophlebitis of unspecified deep vessels of left lower extremity: Secondary | ICD-10-CM | POA: Insufficient documentation

## 2023-08-31 ENCOUNTER — Ambulatory Visit (HOSPITAL_COMMUNITY): Admission: RE | Admit: 2023-08-31 | Payer: MEDICAID | Source: Ambulatory Visit

## 2023-09-13 ENCOUNTER — Ambulatory Visit (HOSPITAL_COMMUNITY)
Admission: RE | Admit: 2023-09-13 | Discharge: 2023-09-13 | Disposition: A | Discharge status: Home / Self Care | Payer: MEDICAID | Source: Ambulatory Visit | Attending: Physician Assistant | Admitting: Physician Assistant

## 2023-09-13 ENCOUNTER — Inpatient Hospital Stay: Payer: MEDICAID | Attending: Oncology | Admitting: Physician Assistant

## 2023-09-13 ENCOUNTER — Inpatient Hospital Stay: Payer: MEDICAID

## 2023-09-13 VITALS — BP 124/80 | HR 110 | Temp 99.9°F | Resp 18 | Wt 247.4 lb

## 2023-09-13 DIAGNOSIS — I82432 Acute embolism and thrombosis of left popliteal vein: Secondary | ICD-10-CM

## 2023-09-13 DIAGNOSIS — D696 Thrombocytopenia, unspecified: Secondary | ICD-10-CM

## 2023-09-13 DIAGNOSIS — D6959 Other secondary thrombocytopenia: Secondary | ICD-10-CM | POA: Diagnosis not present

## 2023-09-13 DIAGNOSIS — E611 Iron deficiency: Secondary | ICD-10-CM

## 2023-09-13 DIAGNOSIS — D731 Hypersplenism: Secondary | ICD-10-CM

## 2023-09-13 LAB — CBC WITH DIFFERENTIAL/PLATELET
Abs Immature Granulocytes: 0.04 10*3/uL (ref 0.00–0.07)
Basophils Absolute: 0 10*3/uL (ref 0.0–0.1)
Basophils Relative: 0 %
Eosinophils Absolute: 0.1 10*3/uL (ref 0.0–0.5)
Eosinophils Relative: 1 %
HCT: 42.1 % (ref 36.0–46.0)
Hemoglobin: 13.1 g/dL (ref 12.0–15.0)
Immature Granulocytes: 1 %
Lymphocytes Relative: 11 %
Lymphs Abs: 0.9 10*3/uL (ref 0.7–4.0)
MCH: 28.2 pg (ref 26.0–34.0)
MCHC: 31.1 g/dL (ref 30.0–36.0)
MCV: 90.5 fL (ref 80.0–100.0)
Monocytes Absolute: 0.7 10*3/uL (ref 0.1–1.0)
Monocytes Relative: 9 %
Neutro Abs: 6.5 10*3/uL (ref 1.7–7.7)
Neutrophils Relative %: 78 %
Platelets: 114 10*3/uL — ABNORMAL LOW (ref 150–400)
RBC: 4.65 MIL/uL (ref 3.87–5.11)
RDW: 15.6 % — ABNORMAL HIGH (ref 11.5–15.5)
WBC: 8.3 10*3/uL (ref 4.0–10.5)
nRBC: 0 % (ref 0.0–0.2)

## 2023-09-13 LAB — COMPREHENSIVE METABOLIC PANEL
ALT: 27 U/L (ref 0–44)
AST: 25 U/L (ref 15–41)
Albumin: 2.8 g/dL — ABNORMAL LOW (ref 3.5–5.0)
Alkaline Phosphatase: 170 U/L — ABNORMAL HIGH (ref 38–126)
Anion gap: 8 (ref 5–15)
BUN: 7 mg/dL (ref 6–20)
CO2: 26 mmol/L (ref 22–32)
Calcium: 8.2 mg/dL — ABNORMAL LOW (ref 8.9–10.3)
Chloride: 102 mmol/L (ref 98–111)
Creatinine, Ser: 0.54 mg/dL (ref 0.44–1.00)
GFR, Estimated: >60 mL/min (ref >60)
Glucose, Bld: 323 mg/dL — ABNORMAL HIGH (ref 70–99)
Potassium: 3.4 mmol/L — ABNORMAL LOW (ref 3.5–5.1)
Sodium: 136 mmol/L (ref 135–145)
Total Bilirubin: 1.2 mg/dL — ABNORMAL HIGH (ref <1.2)
Total Protein: 6.3 g/dL — ABNORMAL LOW (ref 6.5–8.1)

## 2023-09-13 LAB — D-DIMER, QUANTITATIVE: D-Dimer, Quant: >20 ug{FEU}/mL — ABNORMAL HIGH (ref 0.00–0.50)

## 2023-09-13 LAB — IRON AND TIBC
Iron: 27 ug/dL — ABNORMAL LOW (ref 28–170)
Saturation Ratios: 11 % (ref 10.4–31.8)
TIBC: 254 ug/dL (ref 250–450)
UIBC: 227 ug/dL

## 2023-09-13 LAB — FERRITIN: Ferritin: 158 ng/mL (ref 11–307)

## 2023-09-13 MED ORDER — FERROUS SULFATE 325 (65 FE) MG PO TBEC: 325.0000 mg | DELAYED_RELEASE_TABLET | Freq: Every day | ORAL | 3 refills | Status: AC

## 2023-09-13 MED ORDER — ENOXAPARIN SODIUM 100 MG/ML IJ SOSY: 100.0000 mg | PREFILLED_SYRINGE | Freq: Two times a day (BID) | INTRAMUSCULAR | 3 refills | Status: DC

## 2023-09-13 NOTE — Progress Notes (Unsigned)
[facility] [address] [place], [place] [address]   CLINIC:  Medical Oncology/Hematology  PCP:  Rica Records, FNP [phone] [address]. [address] [place] [address] [phone]   REASON FOR VISIT:  Follow-up for LLE DVT, thrombocytopenia (cirrhosis), and iron deficiency  PRIOR THERAPY: Lovenox  CURRENT THERAPY: IVC filter  INTERVAL HISTORY:   Jaime [name] 37 y.o. female was added today for follow-up of her left lower extremity DVT.  She was last seen by Pennie Rushing in July 2024, and was given instructions on multiple occasions to obtain repeat LLE [place] and follow-up after imaging study.  Unfortunately, she canceled and no-showed several appointments and is returning today to reestablish care.  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  She had presented due to increased pain and swelling in left lower extremity.  LLE duplex showed a totally occluding acute DVT of common femoral, femoral, profunda, and gastrocnemius veins and partially occlusive acute DVT in the popliteal vein.  CTA chest PE protocol showed no evidence of PE.  CT abdomen/pelvis showed possible extension into the left external iliac vein to the level of the common iliac.  Hematology was consulted who did not recommend anticoagulation due to her thrombocytopenia (platelets in the 30s during hospitalization).  Vascular surgery was consulted who felt patient was a poor candidate for intervention.  IR was consulted and she underwent IVC filter placement on 08/28/2023.    During hospitalization, IR also recommended splenic artery embolization to help with splenic sequestration and thrombocytopenia.  She underwent splenic artery embolization on 08/31/2023.  She was recommended to monitor platelet count and start anticoagulation if safe to do so.  TODAY'S VISIT: Patient reports that she  stopped taking her Lovenox injections about 2 months ago.  She has not yet had Nexplanon removed by GYN as suggested at last appointment.  She has a significant left leg edema and pain related to her acute on chronic DVT.  She denies any chest pain or hemoptysis.  She does have some residual shortness of breath and cough following recent pneumonia. She has 20% energy and 20% appetite. She endorses that she is maintaining a stable weight.   ASSESSMENT & PLAN:  1.  Provoked left popliteal DVT - Reported pain in the left leg, swelling of both legs since around mid March.  Her mobility was limited due to swelling.  No previous history of DVT/PE. - Lower extremity Doppler (02/04/2023): Positive for DVT within the left popliteal vein.  No evidence of right lower extremity DVT. - Denies any miscarriages.  No personal history of APLS. 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  It was replaced in June 2024. - She was started on Lovenox 100 mg twice daily since the diagnosis of the DVT.  She does not report any bleeding issues. - EGD on 01/19/2023 did not reveal any varices. - Labs from 02/11/2023 did not show evidence of antiphospholipid syndrome. 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  Presented due to increased pain and swelling in left lower extremity.   LLE duplex showed a  totally occluding acute DVT of common femoral, femoral, profunda, and gastrocnemius veins and partially occlusive acute DVT in the popliteal vein. CTA chest PE protocol showed no evidence of PE. CT abdomen/pelvis (08/28/2023) showed possible extension into the left external iliac vein to  the level of the common iliac. Hematology was consulted who did not recommend anticoagulation due to her thrombocytopenia (platelets in the 30s during hospitalization). Vascular surgery was consulted who felt patient was a poor candidate for intervention.   IR was consulted and she underwent IVC filter placement on 08/28/2023. Underwent splenic artery embolization on 08/31/2023 due to her thrombocytopenia. - Patient admits to stopping Lovenox x 2 months prior to acute on chronic DVT in October/November 2024. - Presents today with severe LLE edema and pain. - She has not yet restarted Lovenox due to concern regarding thrombocytopenia. - Patient had outpatient venous US done today (09/13/2023), which again demonstrated extensive occlusive DVT of left lower extremity, as described per imaging from [facility].  (NOTE: CT venogram abdomen/pelvis was recommended, but we will defer this since this was done during hospitalization at [facility]) - Labs today (09/13/2023): Platelets 114, otherwise normal CBC.  D-dimer significantly elevated at >20.  Normal creatinine 0.54. - PLAN: Since platelets are >50, patient instructed to restart Lovenox 100 mg twice daily.   DOACs avoided due to patient's liver disease, are not recommended in patients with Georgiann Cocker Class C.  Patient was Georgiann Cocker class B as of 07/2021, but has been lost to follow-up with GI and needs to reestablish care.  Could consider Eliquis or Xarelto in the future, pending liver evaluation. - We will recheck labs (CBC/D, D-dimer, BMP) in 3 months. - Patient instructed to have Nexplanon removed and to avoid all hormonal birth control. While progesterone is less likely to cause DVT than estrogen based contraceptives, Nexplanon and other hormonal contraceptives are contraindicated since she had DVT while she had birth control device in place. - We will recheck venous US of left leg in 3 to 6 months (after next office visit).  If stable on  anticoagulation, will discuss with IR regarding possible IVC retrieval. - If clot ever completely resolves, would cautiously consider discontinuation of anticoagulation, but carefully weighing other risk factors such as morbid obesity, poor mobility status, liver cirrhosis, and potential hormonal birth control (not recommended) - Healthy weight loss and increased activity level was encouraged **NOTE: Lab results above were not completed until after patient had left.  I called and discussed lab results over the phone with patient on the evening of 09/13/2023 and instructed her to start Lovenox injections twice daily and to NOT miss any doses.  She verbalized understanding.  Patient's mother had also requested call, but after making multiple calls to the number she left, I was unable to reach her and have sent a MyChart message instead.  2.  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 Dental extractions 1 and half year ago without any excessive bleeding.  Reports prior history of rectal bleeding, but no active bleeding.    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  Cirrhotic changes in the liver. - She follows with Dr. Levon Hedger for cirrhosis and other GI complaints.  She is not yet on liver transplant list. - Hematology workup (01/14/2023): Negative rheumatoid factor, ANA, lupus anticoagulant. Normal B12, folate, copper, MMA. Immature platelet fraction  7.3%.  Reticulocytes 1.6%.  LDH normal. - S/p partial splenic artery embolization while inpatient at [facility] on 08/31/2023. - CBC today (09/13/2023): Platelets improved at 114. 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  Less likely would be ITP or bone marrow disorder at this time.   No evidence of nutritional deficiency. - PLAN: Will continue to monitor with periodic CBCs  3.   Iron deficiency without anemia - Labs from 01/14/2023 show normal Hgb 12.5/MCV 91.2, but with some mild iron deficiency (ferritin 66, iron saturation 11%) - Reports prior history of rectal bleeding, but no active bleeding.  LMP in 2018 (currently has implant) - Colonoscopy (03/31/2022): Preparation of the colon was poor.  Hemorrhoids found on perianal exam. 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  No gastric varices.  5 nonbleeding cratered gastric ulcers with a clear ulcer base in the gastric antrum. - She stopped taking iron tablets several months ago. - Denies any evidence of bleeding. - Labs today (09/13/2023): Hgb 13.1/MCV 90.5, ferritin 158, iron saturation 11%.  Cystatin C and creatinine are both within normal range. - PLAN: Instructed to increase iron tablet to daily  - We will recheck CBC/D, iron/TIBC, and ferritin in 6 months (May 2025)  4.   Elevated light chains - Labs from 01/14/2023 showed SPEP normal.  Mildly elevated kappa free light chain 43.6, elevated lambda 34.1.  Normal FLC ratio 1.28. 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  Elevated kappa lambda ratio 2.77. 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  No clear evidence of plasma cell dyscrasia at this time. - PLAN: We will recheck MGUS labs in 1 year along with 24-hour urine.  If no major changes or new abnormalities at that time, no further testing would be needed.  5.  Social/family history: - She is currently on disability.  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  No history of alcohol use.  Current active smoker half pack to 1 pack of cigarettes per day for 5 years. - No family history of low platelets.  Father had melanoma.  No family history of leukemia.  PLAN SUMMARY: >> Restart Lovenox 100 mg twice daily (Rx sent to pharmacy) >> Restart iron tablet daily >> Labs in 3 months = CBC/D, D-dimer, BMP >> OFFICE visit in 3 months     REVIEW OF SYSTEMS:   Review of Systems  Constitutional:  Positive for fatigue. Negative for appetite change, chills, diaphoresis, fever and unexpected weight change.  HENT:   Negative for lump/mass and nosebleeds.   Eyes:  Negative for eye problems.  Respiratory:  Positive for cough and shortness of breath (Pneumonia). Negative for hemoptysis.   Cardiovascular:  Positive for leg swelling and palpitations. Negative for chest pain.  Gastrointestinal:  Positive for diarrhea, nausea and vomiting. Negative for abdominal pain, blood in stool and constipation.  Genitourinary:  Negative for hematuria.   Skin: Negative.   Neurological:  Positive for headaches and numbness. Negative for dizziness and light-headedness.  Hematological:  Does not bruise/bleed easily.  PHYSICAL EXAM:  ECOG PERFORMANCE STATUS: 1 - Symptomatic but completely ambulatory  Vitals:   09/13/23 1339  BP: 124/80  Pulse: (!) 110  Resp: 18  Temp: 99.9 F (37.7 C)  SpO2: 98%   Filed Weights   09/13/23 1339  Weight: 247 lb 5.7 oz (112.2 kg)   Physical Exam Constitutional:      Appearance: Normal appearance. She is morbidly obese.  Cardiovascular:     Heart sounds: Normal heart sounds.  Pulmonary:     Breath sounds: Normal breath sounds.  Musculoskeletal:     Right lower leg: Edema (3+) present.     Left lower leg: Edema (4+) present.  Neurological:     General: No focal deficit present.     Mental Status: Mental status is at baseline.  Psychiatric:        Behavior: Behavior  normal. Behavior is cooperative.     PAST MEDICAL/SURGICAL HISTORY:  Past Medical History:  Diagnosis Date   Acute encephalopathy 08/31/2020   Adjustment disorder with depressed mood 09/07/2020   Anxiety    Asthma    Chronic abdominal pain    Chronic back pain    Chronic prescription opiate use 02/16/2022   Cirrhosis (HCC)    Cirrhosis (HCC)    COPD (chronic obstructive pulmonary disease) (HCC)    Depression    Diabetes mellitus without complication (HCC)    Diabetes mellitus, type II (HCC)    Diverticulosis    DVT (deep venous thrombosis) (HCC) 02/04/2023   taking lovenox prior to procedure   Fatty liver 03/07/2020   GERD (gastroesophageal reflux disease)    Hepatitis C    Hernia, abdominal    Hyperglycemia due to type 2 diabetes mellitus (HCC) 05/24/2020   Hyperlipidemia    IBS (irritable bowel syndrome) 02/16/2022   Insomnia    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)    Lupus    Migraine headache    Morbid obesity (HCC) 05/24/2020   Neuropathy    Nocturnal seizures (HCC)    Pain of upper abdomen 07/28/2021   Peptic ulcer    Spleen enlarged    Splenomegaly    Past Surgical History:  Procedure Laterality Date   BIOPSY  11/25/2021   Procedure: BIOPSY;  Surgeon: Dolores Frame, MD;  [facility];  Service: Gastroenterology;;   CHOLECYSTECTOMY     COLONOSCOPY WITH PROPOFOL N/A 11/25/2021   Procedure: COLONOSCOPY WITH PROPOFOL;  Surgeon: Dolores Frame, MD;  Location: [facility];  Service: Gastroenterology;  Laterality: N/A;  805   COLONOSCOPY WITH PROPOFOL N/A 03/31/2022   Procedure: COLONOSCOPY WITH PROPOFOL;  Surgeon: Dolores Frame, MD;  Location: [facility];  Service: Gastroenterology;  Laterality: N/A;  730   ESOPHAGOGASTRODUODENOSCOPY  06/2020   done at [facility], candida in upper esophagus (treated with diflucan), ulcerative esophagitis at GE junction, gastritis in stomach, single ulcer in  duodenal bulb, with duodenal mucosa showing no abnormality. No presence of varices   ESOPHAGOGASTRODUODENOSCOPY (EGD) WITH PROPOFOL N/A 11/25/2021   Procedure: ESOPHAGOGASTRODUODENOSCOPY (EGD) WITH PROPOFOL;  Surgeon: Dolores Frame, MD;  [facility]: [facility];  Service: Gastroenterology;  Laterality: N/A;   ESOPHAGOGASTRODUODENOSCOPY (EGD) WITH PROPOFOL N/A 03/31/2022   Procedure: ESOPHAGOGASTRODUODENOSCOPY (EGD) WITH PROPOFOL;  Surgeon: Dolores Frame, MD;  [facility]: [facility];  Service: Gastroenterology;  Laterality: N/A;    SOCIAL HISTORY:  Social History   Socioeconomic History   Marital status: Married    Spouse name: Not on  file   Number of children: Not on file   Years of education: Not on file   Highest education level: Not on file  Occupational History   Not on file  Tobacco Use   Smoking status: Every Day    Current packs/day: 0.50    Types: Cigarettes    Passive exposure: Current   Smokeless tobacco: Never  Vaping Use   Vaping status: Former   Substances: Nicotine, Flavoring  Substance and Sexual Activity   Alcohol use: No   Drug use: No   Sexual activity: Not Currently    Birth control/protection: Implant  Other Topics Concern   Not on file  Social History Narrative   Not on file   Social Determinants of Health   Financial Resource Strain: Medium Risk (05/09/2020)   Overall Financial Resource Strain (CARDIA)    Difficulty of Paying Living Expenses: Somewhat hard  Food Insecurity: Food Insecurity Present (01/14/2023)   Hunger Vital Sign    Worried About Running Out of Food in the Last Year: Sometimes true    Ran Out of Food in the Last Year: Never true  Transportation Needs: No Transportation Needs (01/14/2023)   PRAPARE - Administrator, Civil Service (Medical): No    Lack of Transportation (Non-Medical): No  Physical Activity: Insufficiently Active (05/09/2020)   Exercise Vital Sign    Days of Exercise per Week: 1 day     Minutes of Exercise per Session: 20 min  Stress: Stress Concern Present (05/09/2020)   [employer] of Occupational Health - Occupational Stress Questionnaire    Feeling of Stress : Very much  Social Connections: Unknown (03/04/2022)   [facility]   Social Network    Social Network: Not on file  Intimate Partner Violence: Not At Risk (01/14/2023)   Humiliation, Afraid, Rape, and Kick questionnaire    Fear of Current or Ex-Partner: No    Emotionally Abused: No    Physically Abused: No    Sexually Abused: No    FAMILY HISTORY:  Family History  Problem Relation Age of Onset   Hyperlipidemia Maternal Grandfather     CURRENT MEDICATIONS:  Facility-Administered Encounter Medications as of 09/13/2023  Medication   etonogestrel (NEXPLANON) implant 68 mg   Outpatient Encounter Medications as of 09/13/2023  Medication Sig   Accu-Chek Softclix Lancets lancets USE TO TEST BLOOD SUGAR 4 TIMES A DAY.   albuterol (VENTOLIN HFA) 108 (90 Base) MCG/ACT inhaler Inhale 2 puffs into the lungs every 6 (six) hours as needed for wheezing or shortness of breath.   ARIPiprazole (ABILIFY) 20 MG tablet Take 20 mg by mouth daily.   clonazePAM (KLONOPIN) 0.5 MG tablet Take 0.5 mg by mouth 2 (two) times daily as needed for anxiety.   Continuous Glucose Sensor (DEXCOM G6 SENSOR) MISC Change sensor every 10 days.   Continuous Glucose Transmitter (DEXCOM G6 TRANSMITTER) MISC USE AS DIRECTED.   cyclobenzaprine (FLEXERIL) 10 MG tablet Take 1 tablet (10 mg total) by mouth 3 (three) times daily as needed for muscle spasms.   famotidine (PEPCID) 20 MG tablet Take 1 tablet (20 mg total) by mouth 2 (two) times daily.   glipiZIDE (GLUCOTROL XL) 5 MG 24 hr tablet take 1 tablet by mouth daily with breakfast.   glucose blood (ACCU-CHEK GUIDE) test strip Use as instructed to check blood glucose four times daily   hydrOXYzine (VISTARIL) 50 MG capsule Take 1 capsule (50 mg total) by mouth 3  (three) times daily  as needed.   hyoscyamine (ANASPAZ) 0.125 MG TBDP disintergrating tablet TAKE 1 TABLET UNDER THE TONGUE THREE TIMES A DAY AS NEEDED FOR BLADDER SPASMS OR CRAMPING.   Insulin Pen Needle (GLOBAL EASE INJECT PEN NEEDLES) 31G X 8 MM MISC Use 1 pen needle 4 times daily for insulin.   Insulin Pen Needle (PEN NEEDLES) 32G X 6 MM MISC 1 each by Does not apply route 3 (three) times daily.   insulin regular human CONCENTRATED (HUMULIN R U-500 KWIKPEN) 500 UNIT/ML KwikPen Inject 90 Units into the skin 3 (three) times daily with meals.   lidocaine (XYLOCAINE) 5 % ointment Apply 1 Application topically as needed.   LINZESS 290 MCG CAPS capsule Take 290 mcg by mouth daily.   lubiprostone (AMITIZA) 24 MCG capsule Take 1 capsule (24 mcg total) by mouth 2 (two) times daily with a meal.   metFORMIN (GLUCOPHAGE) 1000 MG tablet take 1 tablet by mouth 2 times a day with a meal.   [EXPIRED] methocarbamol (ROBAXIN) 750 MG tablet Take by mouth.   omeprazole (PRILOSEC) 40 MG capsule TAKE 1 CAPSULE BY MOUTH TWICE DAILY.   ondansetron (ZOFRAN) 4 MG tablet Take 1 tablet (4 mg total) by mouth every 8 (eight) hours as needed for nausea or vomiting.   Oxycodone HCl 10 MG TABS Take 10 mg by mouth 4 (four) times daily as needed.   pantoprazole (PROTONIX) 20 MG tablet Take 1 tablet by mouth once daily   potassium chloride (KLOR-CON) 10 MEQ tablet Take 10 mEq by mouth 2 (two) times daily.   pregabalin (LYRICA) 75 MG capsule Take 75 mg by mouth 3 (three) times daily.   propranolol (INDERAL) 10 MG tablet Take 10 mg by mouth 3 (three) times daily.   sucralfate (CARAFATE) 1 GM/10ML suspension Take 10 mLs (1 g total) by mouth 4 (four) times daily.   tirzepatide [facility]) 2.5 MG/0.5ML Pen Inject 2.5 mg into the skin once a week.   venlafaxine XR (EFFEXOR XR) 75 MG 24 hr capsule Take 1 capsule (75 mg total) by mouth daily with breakfast.   Vitamin D, Ergocalciferol, (DRISDOL) 1.25 MG (50000 UNIT) CAPS capsule Take 1  capsule (50,000 Units total) by mouth every 7 (seven) days. (Patient taking differently: Take 50,000 Units by mouth every Tuesday.)   [DISCONTINUED] doxycycline (VIBRAMYCIN) 100 MG capsule Take by mouth.   [DISCONTINUED] enoxaparin (LOVENOX) 100 MG/ML injection Inject 1 mL (100 mg total) into the skin every 12 (twelve) hours.   [DISCONTINUED] ferrous sulfate 325 (65 FE) MG EC tablet Take 1 tablet (325 mg total) by mouth daily with breakfast.   [DISCONTINUED] lisinopril (ZESTRIL) 5 MG tablet TAKE 1 TABLET ONCE DAILY. (Patient not taking: Reported on 09/14/2023)   [DISCONTINUED] polyethylene glycol-electrolytes (TRILYTE) 420 g solution Take 4,000 mLs by mouth as directed. (Patient not taking: Reported on 09/14/2023)   [DISCONTINUED] QUEtiapine (SEROQUEL) 25 MG tablet Take by mouth. (Patient not taking: Reported on 09/14/2023)   [DISCONTINUED] rosuvastatin (CRESTOR) 10 MG tablet Take 1 tablet (10 mg total) by mouth daily. (Patient not taking: Reported on 09/14/2023)   [DISCONTINUED] tirzepatide [facility]) 5 MG/0.5ML Pen Inject 5 mg into the skin once a week. (Patient not taking: Reported on 09/14/2023)   [DISCONTINUED] traMADol (ULTRAM) 50 MG tablet Take by mouth. (Patient not taking: Reported on 09/14/2023)   enoxaparin (LOVENOX) 100 MG/ML injection Inject 1 mL (100 mg total) into the skin every 12 (twelve) hours.   ferrous sulfate 325 (65 FE) MG EC tablet Take 1 tablet (325 mg  total) by mouth daily with breakfast.    ALLERGIES:  Allergies  Allergen Reactions   Iodine-131 Anaphylaxis, Shortness Of Breath and Swelling   Ivp Dye [Iodinated Contrast Media] Anaphylaxis    Skin gets very red, unable to walk    Ketorolac Tromethamine Shortness Of Breath   Tylenol [Acetaminophen] Other (See Comments)    Due to cirrhosis    Vancomycin Swelling    Facial swelling,    Gabapentin Itching   Nsaids Other (See Comments)    Flares ulcers   Suboxone [Buprenorphine Hcl-Naloxone Hcl]     Withdrawal  symptoms     LABORATORY DATA:  I have reviewed the labs as listed.  CBC    Component Value Date/Time   WBC 7.5 09/14/2023 1703   RBC 4.40 09/14/2023 1703   HGB 12.6 09/14/2023 1703   HGB 13.5 01/01/2023 1408   HCT 40.2 09/14/2023 1703   HCT 41.9 01/01/2023 1408   PLT 102 (L) 09/14/2023 1703   PLT 55 (LL) 01/01/2023 1408   MCV 91.4 09/14/2023 1703   MCV 92 01/01/2023 1408   MCH 28.6 09/14/2023 1703   MCHC 31.3 09/14/2023 1703   RDW 15.4 09/14/2023 1703   RDW 13.1 01/01/2023 1408   LYMPHSABS 0.8 09/14/2023 1703   LYMPHSABS 0.7 01/01/2023 1408   MONOABS 0.6 09/14/2023 1703   EOSABS 0.1 09/14/2023 1703   EOSABS 0.1 01/01/2023 1408   BASOSABS 0.0 09/14/2023 1703   BASOSABS 0.0 01/01/2023 1408      Latest Ref Rng & Units 09/14/2023    6:06 PM 09/14/2023    5:03 PM 09/13/2023    2:25 PM  CMP  Glucose 70 - 99 mg/dL  782  956   BUN 6 - 20 mg/dL  5  7   Creatinine 2.13 - 1.00 mg/dL  0.86  5.78   Sodium 469 - 145 mmol/L  132  136   Potassium 3.5 - 5.1 mmol/L  3.9  3.4   Chloride 98 - 111 mmol/L  102  102   CO2 22 - 32 mmol/L  22  26   Calcium 8.9 - 10.3 mg/dL  8.1  8.2   Total Protein 6.5 - 8.1 g/dL 5.6   6.3   Total Bilirubin <1.2 mg/dL 1.2   1.2   Alkaline Phos 38 - 126 U/L 153   170   AST 15 - 41 U/L 21   25   ALT 0 - 44 U/L 21   27     DIAGNOSTIC IMAGING:  I have independently reviewed the relevant imaging and discussed with the patient.   WRAP UP:  All questions were answered. The patient knows to call the clinic with any problems, questions or concerns.  Medical decision making: High  Time spent on visit: I spent 30 minutes counseling the patient face to face. The total time spent in the appointment was 45 minutes and more than 50% was on counseling.  Carnella Guadalajara, PA-C  09/14/23 7:23 PM

## 2023-09-13 NOTE — Patient Instructions (Addendum)
[facility] at [facility], [place] **VISIT SUMMARY & IMPORTANT INSTRUCTIONS **   You were seen today by Rojelio Brenner PA-C for your follow-up visit.    BLOOD CLOT IN YOUR LEG ("DVT") The blood clot in your leg has grown much bigger. This is unfortunately because you had missed several months of your blood thinner. Other factors that may have contributed to your blood clot include... Obesity Low levels of physical activity Implanted birth control (Nexplanon) You had an IVC filter placed in the main vein in your abdomen/chest when you were at [facility] in October 2024.  This will not treat or prevent blood clots in your leg, but will prevent those blood clots from spreading to your lung. NEXT STEPS: We will check labs today.  I will call you today or tomorrow to discuss lab results with you. If your platelets are high enough, we will restart you on blood thinner.   You will need to take Lovenox injections twice daily without missing any doses.   You should not stop this medication without talking to our office first. Have your Nexplanon birth control removed as soon as possible.  It is recommended that you avoid hormonal birth control.  IRON DEFICIENCY Please start taking your iron tablets every day again.  LOW PLATELETS We will check your platelet levels today. Your low platelets are related to liver cirrhosis, but may have improved after your recent splenic artery embolization (while hospitalized at [facility]).  LIVER CIRRHOSIS: It is EXTREMELY important that you also contact your GI doctor/gastroenterologist (Dr. Levon Hedger & NP Doylene Bode) to reschedule your missed appointments and follow-up of your liver cirrhosis. Call [phone] to make an appointment with your liver/GI specialist.  MEDICATIONS: Refill prescription for Lovenox has been sent to your pharmacy.  FOLLOW-UP APPOINTMENT: Same-day labs and office visit in 3  months.  ** Thank you for trusting me with your healthcare!  I strive to provide all of my patients with quality care at each visit.  If you receive a survey for this visit, I would be so grateful to you for taking the time to provide feedback.  Thank you in advance!  ~ Amarya Kuehl                   Dr. Doreatha Massed   &   Rojelio Brenner, PA-C   - - - - - - - - - - - - - - - - - -    Thank you for choosing [facility] at [facility] to provide your oncology and hematology care.  To afford each patient quality time with our provider, please arrive at least 15 minutes before your scheduled appointment time.   If you have a lab appointment with the Cancer Center please come in thru the Main Entrance and check in at the main information desk.  You need to re-schedule your appointment should you arrive 10 or more minutes late.  We strive to give you quality time with our providers, and arriving late affects you and other patients whose appointments are after yours.  Also, if you no show three or more times for appointments you may be dismissed from the clinic at the providers discretion.     Again, thank you for choosing [facility].  Our hope is that these requests will decrease the amount of time that you wait before being seen by our physicians.  _____________________________________________________________  Should you have questions after your visit to [facility], please contact our office at [phone] and follow the prompts.  Our office hours are 8:00 a.m. and 4:30 p.m. Monday - Friday.  Please note that voicemails left after 4:00 p.m. may not be returned until the following business day.  We are closed weekends and major holidays.  You do have access to a nurse 24-7, just call the main number to the clinic [phone] and do not press any options, hold on the line and a nurse will answer the phone.    For prescription refill  requests, have your pharmacy contact our office and allow 72 hours.

## 2023-09-14 ENCOUNTER — Ambulatory Visit: Payer: MEDICAID | Admitting: Family Medicine

## 2023-09-14 ENCOUNTER — Other Ambulatory Visit: Payer: Self-pay

## 2023-09-14 ENCOUNTER — Ambulatory Visit: Payer: Self-pay | Admitting: Family Medicine

## 2023-09-14 ENCOUNTER — Encounter: Payer: Self-pay | Admitting: Family Medicine

## 2023-09-14 ENCOUNTER — Encounter (HOSPITAL_COMMUNITY): Payer: Self-pay | Admitting: Emergency Medicine

## 2023-09-14 ENCOUNTER — Emergency Department (HOSPITAL_COMMUNITY)
Admission: EM | Admit: 2023-09-14 | Discharge: 2023-09-14 | Disposition: A | Discharge status: Home / Self Care | Payer: MEDICAID | Source: Home / Self Care | Attending: Emergency Medicine | Admitting: Emergency Medicine

## 2023-09-14 ENCOUNTER — Encounter: Payer: Self-pay | Admitting: Physician Assistant

## 2023-09-14 ENCOUNTER — Emergency Department (HOSPITAL_COMMUNITY): Payer: MEDICAID

## 2023-09-14 VITALS — BP 116/70 | HR 118 | Ht 69.0 in | Wt 248.1 lb

## 2023-09-14 DIAGNOSIS — R2243 Localized swelling, mass and lump, lower limb, bilateral: Secondary | ICD-10-CM | POA: Insufficient documentation

## 2023-09-14 DIAGNOSIS — L03116 Cellulitis of left lower limb: Secondary | ICD-10-CM | POA: Diagnosis not present

## 2023-09-14 DIAGNOSIS — J40 Bronchitis, not specified as acute or chronic: Secondary | ICD-10-CM

## 2023-09-14 DIAGNOSIS — Z794 Long term (current) use of insulin: Secondary | ICD-10-CM | POA: Insufficient documentation

## 2023-09-14 DIAGNOSIS — J209 Acute bronchitis, unspecified: Secondary | ICD-10-CM | POA: Insufficient documentation

## 2023-09-14 LAB — HEPATIC FUNCTION PANEL
ALT: 21 U/L (ref 0–44)
AST: 21 U/L (ref 15–41)
Albumin: 2.5 g/dL — ABNORMAL LOW (ref 3.5–5.0)
Alkaline Phosphatase: 153 U/L — ABNORMAL HIGH (ref 38–126)
Bilirubin, Direct: 0.5 mg/dL — ABNORMAL HIGH (ref 0.0–0.2)
Indirect Bilirubin: 0.7 mg/dL (ref 0.3–0.9)
Total Bilirubin: 1.2 mg/dL — ABNORMAL HIGH (ref <1.2)
Total Protein: 5.6 g/dL — ABNORMAL LOW (ref 6.5–8.1)

## 2023-09-14 LAB — CBC WITH DIFFERENTIAL/PLATELET
Abs Immature Granulocytes: 0.04 10*3/uL (ref 0.00–0.07)
Basophils Absolute: 0 10*3/uL (ref 0.0–0.1)
Basophils Relative: 0 %
Eosinophils Absolute: 0.1 10*3/uL (ref 0.0–0.5)
Eosinophils Relative: 1 %
HCT: 40.2 % (ref 36.0–46.0)
Hemoglobin: 12.6 g/dL (ref 12.0–15.0)
Immature Granulocytes: 1 %
Lymphocytes Relative: 10 %
Lymphs Abs: 0.8 10*3/uL (ref 0.7–4.0)
MCH: 28.6 pg (ref 26.0–34.0)
MCHC: 31.3 g/dL (ref 30.0–36.0)
MCV: 91.4 fL (ref 80.0–100.0)
Monocytes Absolute: 0.6 10*3/uL (ref 0.1–1.0)
Monocytes Relative: 8 %
Neutro Abs: 5.9 10*3/uL (ref 1.7–7.7)
Neutrophils Relative %: 80 %
Platelets: 102 10*3/uL — ABNORMAL LOW (ref 150–400)
RBC: 4.4 MIL/uL (ref 3.87–5.11)
RDW: 15.4 % (ref 11.5–15.5)
WBC: 7.5 10*3/uL (ref 4.0–10.5)
nRBC: 0 % (ref 0.0–0.2)

## 2023-09-14 LAB — BASIC METABOLIC PANEL
Anion gap: 8 (ref 5–15)
BUN: 5 mg/dL — ABNORMAL LOW (ref 6–20)
CO2: 22 mmol/L (ref 22–32)
Calcium: 8.1 mg/dL — ABNORMAL LOW (ref 8.9–10.3)
Chloride: 102 mmol/L (ref 98–111)
Creatinine, Ser: 0.59 mg/dL (ref 0.44–1.00)
GFR, Estimated: >60 mL/min (ref >60)
Glucose, Bld: 486 mg/dL — ABNORMAL HIGH (ref 70–99)
Potassium: 3.9 mmol/L (ref 3.5–5.1)
Sodium: 132 mmol/L — ABNORMAL LOW (ref 135–145)

## 2023-09-14 LAB — BRAIN NATRIURETIC PEPTIDE: B Natriuretic Peptide: 102 pg/mL — ABNORMAL HIGH (ref 0.0–100.0)

## 2023-09-14 LAB — D-DIMER, QUANTITATIVE: D-Dimer, Quant: 19.12 ug{FEU}/mL — ABNORMAL HIGH (ref 0.00–0.50)

## 2023-09-14 LAB — CBG MONITORING, ED: Glucose-Capillary: 496 mg/dL — ABNORMAL HIGH (ref 70–99)

## 2023-09-14 MED ORDER — ONDANSETRON HCL 4 MG/2ML IJ SOLN
4.0000 mg | Freq: Once | INTRAMUSCULAR | Status: AC
Start: 2023-09-14 — End: 2023-09-14
  Administered 2023-09-14: 4 mg via INTRAVENOUS
  Filled 2023-09-14: qty 2

## 2023-09-14 MED ORDER — HYDROMORPHONE HCL 1 MG/ML IJ SOLN
0.5000 mg | Freq: Once | INTRAMUSCULAR | Status: AC
  Administered 2023-09-14: 0.5 mg via INTRAVENOUS
  Filled 2023-09-14: qty 0.5

## 2023-09-14 MED ORDER — TORSEMIDE 40 MG PO TABS: 1.0000 | ORAL_TABLET | Freq: Every day | ORAL | 0 refills | Status: DC

## 2023-09-14 MED ORDER — DOXYCYCLINE HYCLATE 100 MG PO CAPS: 100.0000 mg | ORAL_CAPSULE | Freq: Two times a day (BID) | ORAL | 0 refills | Status: DC

## 2023-09-14 MED ORDER — SODIUM CHLORIDE 0.9 % IV BOLUS
1000.0000 mL | Freq: Once | INTRAVENOUS | Status: AC
  Administered 2023-09-14: 1000 mL via INTRAVENOUS

## 2023-09-14 MED ORDER — INSULIN ASPART 100 UNIT/ML IJ SOLN
10.0000 [IU] | Freq: Once | INTRAMUSCULAR | Status: AC
  Administered 2023-09-14: 10 [IU] via SUBCUTANEOUS

## 2023-09-14 NOTE — ED Provider Notes (Signed)
[place] EMERGENCY DEPARTMENT AT [facility] Provider Note   CSN: [id number] Arrival date & time: 09/14/23  1539     History  Chief Complaint  Patient presents with   Wound Check    DVT, Left Leg    Jaime [name] is a 37 y.o. female.  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  She is on Lovenox and she has a filter.  She complains of cough and mild shortness of breath  The history is provided by the patient and medical records.  Cough Cough characteristics:  Non-productive Sputum characteristics:  Nondescript Severity:  Mild Onset quality:  Gradual Timing:  Intermittent Progression:  Waxing and waning Chronicity:  New Smoker: no   Context: not animal exposure   Relieved by:  Nothing Worsened by:  Activity Associated symptoms: no chest pain, no eye discharge, no headaches and no rash        Home Medications Prior to Admission medications   Medication Sig Start Date End Date Taking? Authorizing Provider  albuterol (VENTOLIN HFA) 108 (90 Base) MCG/ACT inhaler Inhale 2 puffs into the lungs every 6 (six) hours as needed for wheezing or shortness of breath. 07/17/22  Yes Leath-Warren, Sadie Haber, NP  ARIPiprazole (ABILIFY) 20 MG tablet Take 20 mg by mouth daily. 08/02/23  Yes [provider]  clonazePAM (KLONOPIN) 0.5 MG tablet Take 0.5 mg by mouth 2 (two) times daily as needed for anxiety. 08/02/23  Yes [provider]  cyclobenzaprine (FLEXERIL) 10 MG tablet Take 1 tablet (10 mg total) by mouth 3 (three) times daily as needed for muscle spasms. 01/26/23  Yes Del Newman Nip, Tenna Child, FNP  doxycycline (VIBRAMYCIN) 100 MG capsule Take 1 capsule (100 mg total) by mouth 2 (two) times daily for 10 days. 09/14/23 09/24/23 Yes Del Nigel Berthold, FNP  enoxaparin (LOVENOX) 100 MG/ML injection Inject 1 mL (100 mg total) into the skin every 12 (twelve) hours. 09/13/23  Yes Pennington, Rebekah M, PA-C   famotidine (PEPCID) 20 MG tablet Take 1 tablet (20 mg total) by mouth 2 (two) times daily. 01/07/23  Yes Rondel Baton, MD  ferrous sulfate 325 (65 FE) MG EC tablet Take 1 tablet (325 mg total) by mouth daily with breakfast. 09/13/23  Yes Pennington, Rebekah M, PA-C  glipiZIDE (GLUCOTROL XL) 5 MG 24 hr tablet take 1 tablet by mouth daily with breakfast. 08/10/23  Yes Dani Gobble, NP  hydrOXYzine (VISTARIL) 50 MG capsule Take 1 capsule (50 mg total) by mouth 3 (three) times daily as needed. 05/25/23  Yes Del Newman Nip, Tenna Child, FNP  hyoscyamine (ANASPAZ) 0.125 MG TBDP disintergrating tablet TAKE 1 TABLET UNDER THE TONGUE THREE TIMES A DAY AS NEEDED FOR BLADDER SPASMS OR CRAMPING. 05/10/23  Yes Carlan, Chelsea L, NP  insulin regular human CONCENTRATED (HUMULIN R U-500 KWIKPEN) 500 UNIT/ML KwikPen Inject 90 Units into the skin 3 (three) times daily with meals. 03/24/23  Yes Reardon, Freddi [place], NP  lidocaine (XYLOCAINE) 5 % ointment Apply 1 Application topically as needed. 04/27/23  Yes Del Newman Nip, Tenna Child, FNP  LINZESS 290 MCG CAPS capsule Take 290 mcg by mouth daily. 10/16/22  Yes [provider]  lubiprostone (AMITIZA) 24 MCG capsule Take 1 capsule (24 mcg total) by mouth 2 (two) times daily with a meal. 09/02/22  Yes Marguerita Merles, Reuel Boom, MD  metFORMIN (GLUCOPHAGE) 1000 MG tablet take 1 tablet by mouth 2 times a day with a meal. 08/10/23  Yes Dani Gobble, NP  omeprazole (PRILOSEC) 40 MG capsule TAKE 1 CAPSULE BY MOUTH TWICE DAILY. 05/10/23  Yes Carlan, Chelsea L, NP  ondansetron (ZOFRAN) 4 MG tablet Take 1 tablet (4 mg total) by mouth every 8 (eight) hours as needed for nausea or vomiting. 03/08/23  Yes Carlan, Chelsea L, NP  Oxycodone HCl 10 MG TABS Take 10 mg by mouth 4 (four) times daily as needed. 09/03/23  Yes [provider]  pantoprazole (PROTONIX) 20 MG tablet Take 1 tablet by mouth once daily 06/21/23  Yes Del Newman Nip, [place], FNP  potassium chloride  (KLOR-CON) 10 MEQ tablet Take 10 mEq by mouth 2 (two) times daily. 08/10/23  Yes [provider]  pregabalin (LYRICA) 75 MG capsule Take 75 mg by mouth 3 (three) times daily. 09/03/23 10/03/23 Yes [provider]  propranolol (INDERAL) 10 MG tablet Take 10 mg by mouth 3 (three) times daily. 08/02/23  Yes [provider]  sucralfate (CARAFATE) 1 GM/10ML suspension Take 10 mLs (1 g total) by mouth 4 (four) times daily. 03/08/23  Yes Carlan, Chelsea L, NP  tirzepatide [facility]) 2.5 MG/0.5ML Pen Inject 2.5 mg into the skin once a week. 07/26/23  Yes Dani Gobble, NP  torsemide (DEMADEX) 20 MG tablet Take 20 mg by mouth 2 (two) times daily. 09/14/23  Yes [provider]  venlafaxine XR (EFFEXOR XR) 75 MG 24 hr capsule Take 1 capsule (75 mg total) by mouth daily with breakfast. 05/25/23  Yes Del Newman Nip, [place], FNP  Vitamin D, Ergocalciferol, (DRISDOL) 1.25 MG (50000 UNIT) CAPS capsule Take 1 capsule (50,000 Units total) by mouth every 7 (seven) days. Patient taking differently: Take 50,000 Units by mouth every Tuesday. 12/10/21  Yes Dani Gobble, NP  Accu-Chek Softclix Lancets lancets USE TO TEST BLOOD SUGAR 4 TIMES A DAY. 05/28/22   Dani Gobble, NP  Continuous Glucose Sensor (DEXCOM G6 SENSOR) MISC Change sensor every 10 days. 03/24/23   Dani Gobble, NP  Continuous Glucose Transmitter (DEXCOM G6 TRANSMITTER) MISC USE AS DIRECTED. 03/24/23   Dani Gobble, NP  glucose blood (ACCU-CHEK GUIDE) test strip Use as instructed to check blood glucose four times daily 12/01/21   Dani Gobble, NP  Insulin Pen Needle (GLOBAL EASE INJECT PEN NEEDLES) 31G X 8 MM MISC Use 1 pen needle 4 times daily for insulin. 11/10/21   Dani Gobble, NP  Insulin Pen Needle (PEN NEEDLES) 32G X 6 MM MISC 1 each by Does not apply route 3 (three) times daily. 02/26/21   Dani Gobble, NP      Allergies    Iodine-131, Ivp dye [iodinated contrast media], Ketorolac  tromethamine, Tylenol [acetaminophen], Vancomycin, Gabapentin, Nsaids, and Suboxone [buprenorphine hcl-naloxone hcl]    Review of Systems   Review of Systems  Constitutional:  Negative for appetite change and fatigue.  HENT:  Negative for congestion, ear discharge and sinus pressure.   Eyes:  Negative for discharge.  Respiratory:  Positive for cough.   Cardiovascular:  Negative for chest pain.  Gastrointestinal:  Negative for abdominal pain and diarrhea.  Genitourinary:  Negative for frequency and hematuria.  Musculoskeletal:  Negative for back pain.  Skin:  Negative for rash.  Neurological:  Negative for seizures and headaches.  Psychiatric/Behavioral:  Negative for hallucinations.     Physical Exam Updated Vital Signs BP 124/76   Pulse (!) 108   Temp 99.5 F (37.5 C) (Oral)   Resp (!) 26   Ht  5\' 9"  (1.753 m)   Wt 112.5 kg   LMP  (LMP Unknown)   SpO2 96%   BMI 36.63 kg/m  Physical Exam Vitals and nursing note reviewed.  Constitutional:      Appearance: She is well-developed.  HENT:     Head: Normocephalic.     Nose: Nose normal.  Eyes:     General: No scleral icterus.    Conjunctiva/sclera: Conjunctivae normal.  Neck:     Thyroid: No thyromegaly.  Cardiovascular:     Rate and Rhythm: Normal rate and regular rhythm.     Heart sounds: No murmur heard.    No friction rub. No gallop.  Pulmonary:     Breath sounds: No stridor. No wheezing or rales.  Chest:     Chest wall: No tenderness.  Abdominal:     General: There is no distension.     Tenderness: There is no abdominal tenderness. There is no rebound.  Musculoskeletal:        General: Normal range of motion.     Cervical back: Neck supple.     Comments: Significant swelling of both lower legs  Lymphadenopathy:     Cervical: No cervical adenopathy.  Skin:    Findings: No erythema or rash.  Neurological:     Mental Status: She is alert and oriented to person, place, and time.     Motor: No abnormal muscle  tone.     Coordination: Coordination normal.  Psychiatric:        Behavior: Behavior normal.     ED Results / Procedures / Treatments   Labs (all labs ordered are listed, but only abnormal results are displayed) Labs Reviewed  CBC WITH DIFFERENTIAL/PLATELET - Abnormal; Notable for the following components:      Result Value   Platelets 102 (*)    All other components within normal limits  BASIC METABOLIC PANEL - Abnormal; Notable for the following components:   Sodium 132 (*)    Glucose, Bld 486 (*)    BUN 5 (*)    Calcium 8.1 (*)    All other components within normal limits  D-DIMER, QUANTITATIVE - Abnormal; Notable for the following components:   D-Dimer, Quant 19.12 (*)    All other components within normal limits  HEPATIC FUNCTION PANEL - Abnormal; Notable for the following components:   Total Protein 5.6 (*)    Albumin 2.5 (*)    Alkaline Phosphatase 153 (*)    Total Bilirubin 1.2 (*)    Bilirubin, Direct 0.5 (*)    All other components within normal limits  BRAIN NATRIURETIC PEPTIDE - Abnormal; Notable for the following components:   B Natriuretic Peptide 102.0 (*)    All other components within normal limits  CBG MONITORING, ED - Abnormal; Notable for the following components:   Glucose-Capillary 496 (*)    All other components within normal limits    EKG EKG Interpretation Date/Time:  Tuesday September 14 2023 16:36:33 EST Ventricular Rate:  116 PR Interval:  142 QRS Duration:  79 QT Interval:  346 QTC Calculation: 481 R Axis:   31  Text Interpretation: Sinus tachycardia Borderline T abnormalities, inferior leads Confirmed by Bethann Berkshire [phone]) on 09/14/2023 8:08:59 PM  Radiology DG Chest 2 View  Result Date: 09/14/2023 CLINICAL DATA:  SOB EXAM: CHEST - 2 VIEW COMPARISON:  X-ray 01/16/2023, CT chest 07/29/2020 FINDINGS: The heart and mediastinal contours are within normal limits. Bibasilar atelectasis. No focal consolidation. No pulmonary edema. No  pleural effusion.  No pneumothorax. No acute osseous abnormality. IMPRESSION: No active cardiopulmonary disease. Electronically Signed   By: Tish Frederickson M.D.   On: 09/14/2023 19:52   US Venous Img Lower Unilateral Left  Result Date: 09/13/2023 CLINICAL DATA:  f/u left popliteal DVT EXAM: LEFT LOWER EXTREMITY VENOUS DOPPLER ULTRASOUND TECHNIQUE: Gray-scale sonography with compression, as well as color and duplex ultrasound, were performed to evaluate the deep venous system(s) from the level of the common femoral vein through the popliteal and proximal calf veins. COMPARISON:  Lower extremity venous duplex, 07/30/2020. CT AP, 03/09/2023. FINDINGS: VENOUS Heterogeneously-echogenic, occlusive filling defect within the imaged portions of the LEFT common femoral, superficial femoral, and popliteal veins. Extension to include imaged portions of profunda femoral and great saphenous vein. Difficult visualization of the calf veins. Limited views of the contralateral common femoral vein are unremarkable. OTHER No evidence abnormal fluid collection. Subcutaneous edema of the imaged distal LEFT lower extremity Limitations: none IMPRESSION: Acute, occlusive and extensive LEFT lower extremity DVT. The central extension of the thrombus is not visualized on this evaluation. Recommend CT venogram abdomen pelvis to evaluate/rule out pelvic versus caval extension, and consider consultation by vascular interventional specialist for potential further evaluation and management. These results will be called to the ordering clinician or representative by the Radiologist Assistant, and communication documented in the PACS or [employer]. Roanna Banning, MD [facility] Radiology Electronically Signed   By: Roanna Banning M.D.   On: 09/13/2023 10:30    Procedures Procedures    Medications Ordered in ED Medications  ondansetron (ZOFRAN) injection 4 mg (4 mg Intravenous Given  09/14/23 1751)  HYDROmorphone (DILAUDID) injection 0.5 mg (0.5 mg Intravenous Given 09/14/23 1749)  sodium chloride 0.9 % bolus 1,000 mL (0 mLs Intravenous Stopped 09/14/23 1947)  insulin aspart (novoLOG) injection 10 Units (10 Units Subcutaneous Given 09/14/23 1815)  HYDROmorphone (DILAUDID) injection 0.5 mg (0.5 mg Intravenous Given 09/14/23 2014)    ED Course/ Medical Decision Making/ A&P                                 Medical Decision Making Amount and/or Complexity of Data Reviewed Labs: ordered. Radiology: ordered.  Risk Prescription drug management.   Patient with a DVT which she is given Lovenox for.  She also has a filter.  She recently started with a cough which is probably a bronchitis and she was prescribed doxycycline.  She will follow-up with her hematologist        Final Clinical Impression(s) / ED Diagnoses Final diagnoses:  Bronchitis    Rx / DC Orders ED Discharge Orders     None         Bethann Berkshire, MD 09/18/23 1023

## 2023-09-14 NOTE — Assessment & Plan Note (Addendum)
Presentation suggests a potential localized infection and inflammation Will treat with Doxycyline 100 mg twice daily x 10 days Torsemide 40 mg  daily x 7 days Discussed to keep skin clean and dry. Place a cool, damp cloth on the affected area, Apply compression wraps or stockings to help reduce the swelling Elevate the affected part of the body,  Follow up for erythema, purulent drainage, fever. . Patient verbalizes understanding regarding plan of care and all questions answered

## 2023-09-14 NOTE — Telephone Encounter (Signed)
Copied from CRM [id number]. Topic: Clinical - Red Word Triage >> Sep 14, 2023  8:25 AM Dennison Nancy wrote: .Red Word that prompted transfer to Nurse Triage: red word blood clot in left leg swollen with a lot pain # [phone]  Chief Complaint: Dx Blood Clot last night in ER Symptoms:  Swelling in her  leg up to thigh, red and hot  Pertinent Negatives: Patient denies SOB. Disposition: [] ED /[] Urgent Care (no appt availability in office) / [x] Appointment(In office/virtual)/ []  [facility]/ [] Home Care/ [] Refused Recommended Disposition /[] [facility]/ []  Follow-up with PCP Additional Notes:  ER started the patient on  Lovenox .   Reason for Disposition  SEVERE leg swelling (e.g., swelling extends above knee, entire leg is swollen, weeping fluid)  [1] Thigh or calf pain AND [2] only 1 side AND [3] present > 1 hour  Answer Assessment - Initial Assessment Questions 1. ONSET: "When did the swelling start?" (e.g., minutes, hours, days)      3 WEEKS - then on 11/11 it started and increase of pin. 2. LOCATION: "What part of the leg is swollen?"  Left leg all the way up to thigh     3. SEVERITY: "How bad is the swelling?"sevmoderate   - Localized: Small area of swelling localized to one leg.   - MILD pedal edema: Swelling limited to foot and ankle, pitting edema < 1/4 inch (6 mm) deep, rest and elevation eliminate most or all swelling.   - MODERATE edema: Swelling of lower leg to knee, pitting edema > 1/4 inch (6 mm) deep, rest and elevation only partially reduce swelling.   - SEVERE edema: Swelling extends above knee,only  p to the thigh     4. REDNESS: "Does the swelling look red or infected?"      Yes, Red and warm to touch 5. PAIN: "Is the swelling painful to touch?" If Yes, ask: "How painful is it?"   (Scale 1-10; mild, moderate or severe)      Moderate pain 6. FEVER: "Do you have a fever?"  Low grade Fever started yesterday 7. CAUSE: "What do you think is causing the leg  swelling?" Diagnosed with Blood Clot      8. MEDICAL HISTORY: "Do you have a history of blood clots (e.g., DVT), cancer, heart failure, kidney disease, or liver failure?"     *No Answer* 9. RECURRENT SYMPTOM: "Have you had leg swelling before?" If Yes, ask: "When was the last time?" "What happened that time?"     *No Answer* 10. OTHER SYMPTOMS: "Do you have any other symptoms?" (e.g., chest pain, difficulty breathing)       *No Answer* 11. PREGNANCY: "Is there any chance you are pregnant?" "When was your last menstrual period?"       *No Answer*  Protocols used: Leg Swelling and Edema-A-AH

## 2023-09-14 NOTE — Telephone Encounter (Signed)
  Chief Complaint: Patient states provider called in an antibiotic. Patient states Pharmacy told her to call her Provider  back in regards to the antibiotic. Please re-check  antibiotic prescribed for resolution. Disposition: [] ED /[] Urgent Care (no appt availability in office) / [] Appointment(In office/virtual)/ []  [place] [facility]/ [] Home Care/ [] Refused Recommended Disposition /[] [place]/ [x]  Follow-up with PCP Additional Notes: Please review medication - antibiotic that was prescribed per the pharmacy.  Reason for Disposition  [1] Caller has URGENT medicine question about med that PCP or specialist prescribed AND [2] triager unable to answer question  Answer Assessment - Initial Assessment Questions 1. NAME of MEDICINE: "What medicine(s) are you calling about?"     Antibiotic - Unsure of the name 2. QUESTION: "What is your question?" Patient states provider called in an antibiotic. Patient states Pharmacy told her to call her Provider  back in regards to the antibiotic. Please re-check  antibiotic prescribed for resolution.   3. PRESCRIBER: "Who prescribed the medicine?" Reason: if prescribed by specialist, call should be referred to that group.    Office Provider  Protocols used: Medication Question Call-A-AH

## 2023-09-14 NOTE — ED Notes (Signed)
Patient transported to X-ray 

## 2023-09-14 NOTE — ED Triage Notes (Signed)
Pt presents for evaluation of left leg pain, [place] on yesterday revealed DVT in left leg, per pt pain has increased in leg.

## 2023-09-14 NOTE — Patient Instructions (Signed)

## 2023-09-14 NOTE — Progress Notes (Signed)
Established Patient Office Visit   Subjective  Patient ID: Jaime [name], female    DOB: 29-Aug-1986  Age: 37 y.o. MRN: [id number]  Chief Complaint  Patient presents with   Follow-up     Emergency Department follow up. Diagnosed with Blood Clot  in left leg. on Yesterday while at ER. Left Leg Swollen up to thigh, red and painful x 2 mo    She  has a past medical history of Acute encephalopathy (08/31/2020), Adjustment disorder with depressed mood (09/07/2020), Anxiety, Asthma, Chronic abdominal pain, Chronic back pain, Chronic prescription opiate use (02/16/2022), Cirrhosis (HCC), Cirrhosis (HCC), COPD (chronic obstructive pulmonary disease) (HCC), Depression, Diabetes mellitus without complication (HCC), Diabetes mellitus, type II (HCC), Diverticulosis, DVT (deep venous thrombosis) (HCC) (02/04/2023), Fatty liver (03/07/2020), GERD (gastroesophageal reflux disease), Hepatitis C, Hernia, abdominal, Hyperglycemia due to type 2 diabetes mellitus (HCC) (05/24/2020), Hyperlipidemia, IBS (irritable bowel syndrome) (02/16/2022), Insomnia, 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  HPI Patient presents to the clinic with swelling, redness, and pain localized to the left lower leg. She report that these symptoms have developed recently and have progressively worsened. The affected area is tender to touch, and the patient describes the pain as persistent and throbbing. Additionally, she  has  noted difficulty bearing weight on the leg and a sensation of warmth over the swollen area. There is no known recent trauma or injury to the leg. Recent diagnosis of a left leg deep vein thrombosis (DVT), currently being managed by the hematology team with twice-daily Lovenox injections.  Review of Systems   Constitutional:  Negative for chills and fever.  Respiratory:  Negative for shortness of breath.   Cardiovascular:  Negative for chest pain.  Musculoskeletal:  Positive for myalgias.  Neurological:  Negative for dizziness and headaches.      Objective:     BP 116/70   Pulse (!) 118   Ht 5\' 9"  (1.753 m)   Wt 248 lb 1.9 oz (112.5 kg)   LMP  (LMP Unknown)   SpO2 97%   BMI 36.64 kg/m  BP Readings from Last 3 Encounters:  09/14/23 116/70  09/13/23 124/80  08/24/23 (!) 141/81      Physical Exam Vitals reviewed.  Constitutional:      General: She is not in acute distress.    Appearance: Normal appearance. She is not ill-appearing, toxic-appearing or diaphoretic.  HENT:     Head: Normocephalic.  Eyes:     General:        Right eye: No discharge.        Left eye: No discharge.     Conjunctiva/sclera: Conjunctivae normal.  Cardiovascular:     Rate and Rhythm: Normal rate.     Pulses: Normal pulses.     Heart sounds: Normal heart sounds.  Pulmonary:     Effort: Pulmonary effort is normal. No respiratory distress.     Breath sounds: Normal breath sounds.  Musculoskeletal:     Comments: Left leg is swollen, erythematous, and tender to palpation. No open wounds, drainage, or pus noted  Skin:    General: Skin is warm and dry.     Capillary Refill: Capillary refill takes less than 2 seconds.  Neurological:     Mental Status: She is alert.  Psychiatric:        Mood and Affect: Mood normal.  Behavior: Behavior normal.      No results found for any visits on 09/14/23.  The ASCVD Risk score (Arnett DK, et al., 2019) failed to calculate for the following reasons:   The 2019 ASCVD risk score is only valid for ages 63 to 3    Assessment & Plan:  Cellulitis of left lower extremity -     Torsemide; Take 1 tablet by mouth daily.  Dispense: 7 tablet; Refill: 0 -     Doxycycline Hyclate; Take 1 capsule (100 mg total) by mouth 2 (two) times daily for 10 days.   Dispense: 20 capsule; Refill: 0  Left leg cellulitis Assessment & Plan: Presentation suggests a potential localized infection and inflammation Will treat with Doxycyline 100 mg twice daily x 10 days Torsemide 40 mg  daily x 7 days Discussed to keep skin clean and dry. Place a cool, damp cloth on the affected area, Apply compression wraps or stockings to help reduce the swelling Elevate the affected part of the body,  Follow up for erythema, purulent drainage, fever. . Patient verbalizes understanding regarding plan of care and all questions answered      Return in about 20 days (around 10/04/2023), or Follow up.   Cruzita Lederer Newman Nip, FNP

## 2023-09-14 NOTE — Discharge Instructions (Signed)
Start taking the doxycycline that was prescribed for you.  Follow-up with a hematologist if needed

## 2023-09-15 ENCOUNTER — Encounter (HOSPITAL_COMMUNITY): Payer: Self-pay

## 2023-09-15 ENCOUNTER — Emergency Department (HOSPITAL_COMMUNITY): Payer: MEDICAID

## 2023-09-15 ENCOUNTER — Other Ambulatory Visit: Payer: Self-pay

## 2023-09-15 ENCOUNTER — Inpatient Hospital Stay (HOSPITAL_COMMUNITY)
Admission: EM | Admit: 2023-09-15 | Discharge: 2023-09-17 | DRG: 270 | Disposition: A | Discharge status: Home / Self Care | Payer: MEDICAID | Attending: Internal Medicine | Admitting: Internal Medicine

## 2023-09-15 DIAGNOSIS — E440 Moderate protein-calorie malnutrition: Secondary | ICD-10-CM | POA: Diagnosis present

## 2023-09-15 DIAGNOSIS — I81 Portal vein thrombosis: Secondary | ICD-10-CM | POA: Diagnosis present

## 2023-09-15 DIAGNOSIS — K219 Gastro-esophageal reflux disease without esophagitis: Secondary | ICD-10-CM | POA: Diagnosis not present

## 2023-09-15 DIAGNOSIS — Z794 Long term (current) use of insulin: Secondary | ICD-10-CM

## 2023-09-15 DIAGNOSIS — E46 Unspecified protein-calorie malnutrition: Secondary | ICD-10-CM | POA: Insufficient documentation

## 2023-09-15 DIAGNOSIS — D731 Hypersplenism: Secondary | ICD-10-CM | POA: Diagnosis present

## 2023-09-15 DIAGNOSIS — D6959 Other secondary thrombocytopenia: Secondary | ICD-10-CM | POA: Diagnosis present

## 2023-09-15 DIAGNOSIS — E785 Hyperlipidemia, unspecified: Secondary | ICD-10-CM | POA: Diagnosis present

## 2023-09-15 DIAGNOSIS — F17210 Nicotine dependence, cigarettes, uncomplicated: Secondary | ICD-10-CM | POA: Diagnosis present

## 2023-09-15 DIAGNOSIS — Z7985 Long-term (current) use of injectable non-insulin antidiabetic drugs: Secondary | ICD-10-CM

## 2023-09-15 DIAGNOSIS — Z83438 Family history of other disorder of lipoprotein metabolism and other lipidemia: Secondary | ICD-10-CM

## 2023-09-15 DIAGNOSIS — Z6835 Body mass index (BMI) 35.0-35.9, adult: Secondary | ICD-10-CM | POA: Diagnosis not present

## 2023-09-15 DIAGNOSIS — I82402 Acute embolism and thrombosis of unspecified deep veins of left lower extremity: Secondary | ICD-10-CM | POA: Diagnosis present

## 2023-09-15 DIAGNOSIS — D696 Thrombocytopenia, unspecified: Secondary | ICD-10-CM

## 2023-09-15 DIAGNOSIS — K760 Fatty (change of) liver, not elsewhere classified: Secondary | ICD-10-CM | POA: Diagnosis present

## 2023-09-15 DIAGNOSIS — R7989 Other specified abnormal findings of blood chemistry: Secondary | ICD-10-CM | POA: Insufficient documentation

## 2023-09-15 DIAGNOSIS — F419 Anxiety disorder, unspecified: Secondary | ICD-10-CM | POA: Diagnosis present

## 2023-09-15 DIAGNOSIS — Z95828 Presence of other vascular implants and grafts: Secondary | ICD-10-CM

## 2023-09-15 DIAGNOSIS — J189 Pneumonia, unspecified organism: Secondary | ICD-10-CM | POA: Diagnosis present

## 2023-09-15 DIAGNOSIS — I5033 Acute on chronic diastolic (congestive) heart failure: Secondary | ICD-10-CM | POA: Diagnosis present

## 2023-09-15 DIAGNOSIS — I82522 Chronic embolism and thrombosis of left iliac vein: Secondary | ICD-10-CM | POA: Diagnosis present

## 2023-09-15 DIAGNOSIS — Z5986 Financial insecurity: Secondary | ICD-10-CM

## 2023-09-15 DIAGNOSIS — I824Y2 Acute embolism and thrombosis of unspecified deep veins of left proximal lower extremity: Secondary | ICD-10-CM | POA: Diagnosis not present

## 2023-09-15 DIAGNOSIS — I82422 Acute embolism and thrombosis of left iliac vein: Secondary | ICD-10-CM | POA: Diagnosis not present

## 2023-09-15 DIAGNOSIS — I82412 Acute embolism and thrombosis of left femoral vein: Secondary | ICD-10-CM | POA: Diagnosis present

## 2023-09-15 DIAGNOSIS — J440 Chronic obstructive pulmonary disease with acute lower respiratory infection: Secondary | ICD-10-CM | POA: Diagnosis present

## 2023-09-15 DIAGNOSIS — Z888 Allergy status to other drugs, medicaments and biological substances status: Secondary | ICD-10-CM

## 2023-09-15 DIAGNOSIS — K7460 Unspecified cirrhosis of liver: Secondary | ICD-10-CM | POA: Diagnosis present

## 2023-09-15 DIAGNOSIS — Z7901 Long term (current) use of anticoagulants: Secondary | ICD-10-CM

## 2023-09-15 DIAGNOSIS — F17200 Nicotine dependence, unspecified, uncomplicated: Secondary | ICD-10-CM | POA: Diagnosis present

## 2023-09-15 DIAGNOSIS — Z881 Allergy status to other antibiotic agents status: Secondary | ICD-10-CM

## 2023-09-15 DIAGNOSIS — Z91041 Radiographic dye allergy status: Secondary | ICD-10-CM

## 2023-09-15 DIAGNOSIS — M549 Dorsalgia, unspecified: Secondary | ICD-10-CM | POA: Diagnosis present

## 2023-09-15 DIAGNOSIS — G8929 Other chronic pain: Secondary | ICD-10-CM | POA: Diagnosis present

## 2023-09-15 DIAGNOSIS — Z79899 Other long term (current) drug therapy: Secondary | ICD-10-CM

## 2023-09-15 DIAGNOSIS — F4321 Adjustment disorder with depressed mood: Secondary | ICD-10-CM | POA: Diagnosis present

## 2023-09-15 DIAGNOSIS — Z886 Allergy status to analgesic agent status: Secondary | ICD-10-CM

## 2023-09-15 DIAGNOSIS — Z7984 Long term (current) use of oral hypoglycemic drugs: Secondary | ICD-10-CM

## 2023-09-15 DIAGNOSIS — I82512 Chronic embolism and thrombosis of left femoral vein: Secondary | ICD-10-CM | POA: Diagnosis not present

## 2023-09-15 DIAGNOSIS — T82868A Thrombosis of vascular prosthetic devices, implants and grafts, initial encounter: Secondary | ICD-10-CM | POA: Diagnosis not present

## 2023-09-15 DIAGNOSIS — I871 Compression of vein: Secondary | ICD-10-CM | POA: Diagnosis not present

## 2023-09-15 DIAGNOSIS — E1165 Type 2 diabetes mellitus with hyperglycemia: Secondary | ICD-10-CM | POA: Diagnosis present

## 2023-09-15 DIAGNOSIS — E8809 Other disorders of plasma-protein metabolism, not elsewhere classified: Secondary | ICD-10-CM | POA: Diagnosis present

## 2023-09-15 LAB — COMPREHENSIVE METABOLIC PANEL
ALT: 24 U/L (ref 0–44)
AST: 22 U/L (ref 15–41)
Albumin: 3.2 g/dL — ABNORMAL LOW (ref 3.5–5.0)
Alkaline Phosphatase: 196 U/L — ABNORMAL HIGH (ref 38–126)
Anion gap: 12 (ref 5–15)
BUN: 6 mg/dL (ref 6–20)
CO2: 24 mmol/L (ref 22–32)
Calcium: 8.5 mg/dL — ABNORMAL LOW (ref 8.9–10.3)
Chloride: 100 mmol/L (ref 98–111)
Creatinine, Ser: 0.59 mg/dL (ref 0.44–1.00)
GFR, Estimated: >60 mL/min (ref >60)
Glucose, Bld: 382 mg/dL — ABNORMAL HIGH (ref 70–99)
Potassium: 4 mmol/L (ref 3.5–5.1)
Sodium: 136 mmol/L (ref 135–145)
Total Bilirubin: 1.4 mg/dL — ABNORMAL HIGH (ref <1.2)
Total Protein: 7.2 g/dL (ref 6.5–8.1)

## 2023-09-15 LAB — APTT: aPTT: 28 s (ref 24–36)

## 2023-09-15 LAB — CBC WITH DIFFERENTIAL/PLATELET
Abs Immature Granulocytes: 0.05 10*3/uL (ref 0.00–0.07)
Basophils Absolute: 0 10*3/uL (ref 0.0–0.1)
Basophils Relative: 0 %
Eosinophils Absolute: 0.1 10*3/uL (ref 0.0–0.5)
Eosinophils Relative: 2 %
HCT: 44.4 % (ref 36.0–46.0)
Hemoglobin: 14.2 g/dL (ref 12.0–15.0)
Immature Granulocytes: 1 %
Lymphocytes Relative: 8 %
Lymphs Abs: 0.7 10*3/uL (ref 0.7–4.0)
MCH: 29.2 pg (ref 26.0–34.0)
MCHC: 32 g/dL (ref 30.0–36.0)
MCV: 91.4 fL (ref 80.0–100.0)
Monocytes Absolute: 0.6 10*3/uL (ref 0.1–1.0)
Monocytes Relative: 7 %
Neutro Abs: 6.7 10*3/uL (ref 1.7–7.7)
Neutrophils Relative %: 82 %
Platelets: 115 10*3/uL — ABNORMAL LOW (ref 150–400)
RBC: 4.86 MIL/uL (ref 3.87–5.11)
RDW: 15.6 % — ABNORMAL HIGH (ref 11.5–15.5)
WBC: 8.2 10*3/uL (ref 4.0–10.5)
nRBC: 0 % (ref 0.0–0.2)

## 2023-09-15 LAB — TROPONIN I (HIGH SENSITIVITY): Troponin I (High Sensitivity): 3 ng/L (ref <18)

## 2023-09-15 LAB — HCG, SERUM, QUALITATIVE: Preg, Serum: NEGATIVE

## 2023-09-15 LAB — CBG MONITORING, ED
Glucose-Capillary: 406 mg/dL — ABNORMAL HIGH (ref 70–99)
Glucose-Capillary: 332 mg/dL — ABNORMAL HIGH (ref 70–99)

## 2023-09-15 LAB — PROTIME-INR
INR: 1.1 (ref 0.8–1.2)
Prothrombin Time: 14.7 s (ref 11.4–15.2)

## 2023-09-15 LAB — BRAIN NATRIURETIC PEPTIDE: B Natriuretic Peptide: 81 pg/mL (ref 0.0–100.0)

## 2023-09-15 MED ORDER — METOCLOPRAMIDE HCL 5 MG/ML IJ SOLN
10.0000 mg | Freq: Once | INTRAMUSCULAR | Status: AC
Start: 2023-09-15 — End: 2023-09-15
  Administered 2023-09-15: 10 mg via INTRAVENOUS
  Filled 2023-09-15: qty 2

## 2023-09-15 MED ORDER — INSULIN ASPART 100 UNIT/ML IJ SOLN
11.0000 [IU] | Freq: Once | INTRAMUSCULAR | Status: AC
  Administered 2023-09-15: 11 [IU] via SUBCUTANEOUS
  Filled 2023-09-15: qty 1

## 2023-09-15 MED ORDER — HYDROMORPHONE HCL 1 MG/ML IJ SOLN
0.5000 mg | Freq: Once | INTRAMUSCULAR | Status: AC
  Administered 2023-09-15: 0.5 mg via INTRAVENOUS
  Filled 2023-09-15: qty 0.5

## 2023-09-15 MED ORDER — METHYLPREDNISOLONE SODIUM SUCC 40 MG IJ SOLR
40.0000 mg | Freq: Once | INTRAMUSCULAR | Status: AC
  Administered 2023-09-15: 40 mg via INTRAVENOUS
  Filled 2023-09-15: qty 1

## 2023-09-15 MED ORDER — ONDANSETRON HCL 4 MG/2ML IJ SOLN
4.0000 mg | Freq: Once | INTRAMUSCULAR | Status: AC
  Administered 2023-09-15: 4 mg via INTRAVENOUS
  Filled 2023-09-15: qty 2

## 2023-09-15 MED ORDER — IOHEXOL 350 MG/ML SOLN
150.0000 mL | Freq: Once | INTRAVENOUS | Status: AC | PRN
  Administered 2023-09-15: 150 mL via INTRAVENOUS

## 2023-09-15 MED ORDER — DIPHENHYDRAMINE HCL 25 MG PO CAPS
50.0000 mg | ORAL_CAPSULE | Freq: Once | ORAL | Status: AC
  Filled 2023-09-15: qty 2

## 2023-09-15 MED ORDER — DIPHENHYDRAMINE HCL 50 MG/ML IJ SOLN
50.0000 mg | Freq: Once | INTRAMUSCULAR | Status: AC
  Administered 2023-09-15: 50 mg via INTRAVENOUS
  Filled 2023-09-15: qty 1

## 2023-09-15 NOTE — H&P (Incomplete)
History and Physical    Patient: Jaime [name] VFI:[id number] DOB: 1986/08/01 DOA: 09/15/2023 DOS: the patient was seen and examined on 09/16/2023 PCP: Rica Records, FNP  Patient coming from: Home  Chief Complaint:  Chief Complaint  Patient presents with   Leg Pain   HPI: Jaime [name] is a 37 y.o. female with medical history significant of type 2 diabetes mellitus, GERD, tobacco use disorder, DVT who presents to the emergency department due to 2 to 3-day onset of leg pain and about 1 week of shortness of breath.  Apparently she was recently diagnosed to have pneumonia and was placed on antibiotics which she states that she completed but did not feel better.  She endorsed continued pain in left leg which feels more swollen.  Tylenol taken only provided minimal relief of symptoms.  She presented to the ED yesterday (11/12) for similar symptoms and was diagnosed with bronchitis, she was treated with Dilaudid, Zofran and insulin.  Doxycycline was prescribed on discharge and patient was asked to follow-up with hematologist.  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  Patient was on Lovenox due to chronic DVT of LLE.  IR was consulted and patient underwent IVC filter placement on 10/26.  Vascular surgery consulted during the admission felt she was a poor surgical candidate for intervention per medical record.  ED Course:  In the emergency department, she was tachycardic with pulse of 101 bpm, but other vital signs are within normal range.  Workup in the ED showed normal CBC except for platelets of 115.  BMP was normal except for blood glucose of 486 > 332, platelets 102 > 115.  D-dimer 19.12, BNP 102 > 81.  Troponin x 1 was normal.  Pregnancy test was negative. CT venogram of abdomen and pelvis was positive for occlusive DVT within the left lower extremity.  There is extension of occlusive thrombosis to involve the  left external iliac and common iliac veins.  The distal IVC is patent. IVC filter is present. Positive for thrombus within the IVC filter with extension of thrombus about 2.6 cm above the filter apex extending to just below the right renal vein origin. Small volume thrombus in the portal vein. Question tiny amount of thrombus within the SMV. CT angiography chest with contrast suboptimal opacification of the pulmonary arterial system. No definite acute pulmonary embolus to the segmental level. 2. Small left-sided pleural effusion with consolidative airspace disease in the left lower lobe, either representing atelectasis or pneumonia. Small ground-glass focus at the right apex, suspect infectious in etiology. Chest x-ray showed no active cardiopulmonary disease She was treated with Dilaudid, Solu-Medrol was given.  Insulin was provided.  Hospitalist was asked to admit patient for further evaluation and management. Vascular surgeon (Dr. Elliot Cousin) at [facility] was consulted and recommended admitting patient to [facility] with plan to consult on patient on arrival to [facility]   Review of Systems: Review of systems as noted in the HPI. All other systems reviewed and are negative.   Past Medical History:  Diagnosis Date   Acute encephalopathy 08/31/2020   Adjustment disorder with depressed mood 09/07/2020   Anxiety    Asthma    Chronic abdominal pain    Chronic back pain    Chronic prescription opiate use 02/16/2022   Cirrhosis (HCC)    Cirrhosis (HCC)    COPD (chronic obstructive pulmonary disease) (HCC)    Depression  Diabetes mellitus without complication (HCC)    Diabetes mellitus, type II (HCC)    Diverticulosis    DVT (deep venous thrombosis) (HCC) 02/04/2023   taking lovenox prior to procedure   Fatty liver 03/07/2020   GERD (gastroesophageal reflux disease)    Hepatitis C    Hernia, abdominal    Hyperglycemia due to type 2 diabetes mellitus (HCC) 05/24/2020   Hyperlipidemia    IBS (irritable  bowel syndrome) 02/16/2022   Insomnia    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)    Lupus    Migraine headache    Morbid obesity (HCC) 05/24/2020   Neuropathy    Nocturnal seizures (HCC)    Pain of upper abdomen 07/28/2021   Peptic ulcer    Spleen enlarged    Splenomegaly    Past Surgical History:  Procedure Laterality Date   BIOPSY  11/25/2021   Procedure: BIOPSY;  Surgeon: Dolores Frame, MD;  [facility]: [facility];  Service: Gastroenterology;;   CHOLECYSTECTOMY     COLONOSCOPY WITH PROPOFOL N/A 11/25/2021   Procedure: COLONOSCOPY WITH PROPOFOL;  Surgeon: Dolores Frame, MD;  Location: [facility];  Service: Gastroenterology;  Laterality: N/A;  805   COLONOSCOPY WITH PROPOFOL N/A 03/31/2022   Procedure: COLONOSCOPY WITH PROPOFOL;  Surgeon: Dolores Frame, MD;  Location: [facility];  Service: Gastroenterology;  Laterality: N/A;  730   ESOPHAGOGASTRODUODENOSCOPY  06/2020   done at [facility], candida in upper esophagus (treated with diflucan), ulcerative esophagitis at GE junction, gastritis in stomach, single ulcer in duodenal bulb, with duodenal mucosa showing no abnormality. No presence of varices   ESOPHAGOGASTRODUODENOSCOPY (EGD) WITH PROPOFOL N/A 11/25/2021   Procedure: ESOPHAGOGASTRODUODENOSCOPY (EGD) WITH PROPOFOL;  Surgeon: Dolores Frame, MD;  [facility]: [facility];  Service: Gastroenterology;  Laterality: N/A;   ESOPHAGOGASTRODUODENOSCOPY (EGD) WITH PROPOFOL N/A 03/31/2022   Procedure: ESOPHAGOGASTRODUODENOSCOPY (EGD) WITH PROPOFOL;  Surgeon: Dolores Frame, MD;  Location: [facility];  Service: Gastroenterology;  Laterality: N/A;    Social History:  reports that she has been smoking cigarettes. She has been exposed to tobacco smoke. She has never used smokeless tobacco. She reports that she does not drink alcohol and does not use drugs.   Allergies  Allergen Reactions   Iodine-131  Anaphylaxis, Shortness Of Breath and Swelling   Ivp Dye [Iodinated Contrast Media] Anaphylaxis    Skin gets very red, unable to walk    Ketorolac Tromethamine Shortness Of Breath   Tylenol [Acetaminophen] Other (See Comments)    Due to cirrhosis    Vancomycin Swelling    Facial swelling,    Gabapentin Itching   Nsaids Other (See Comments)    Flares ulcers   Suboxone [Buprenorphine Hcl-Naloxone Hcl]     Withdrawal symptoms     Family History  Problem Relation Age of Onset   Hyperlipidemia Maternal Grandfather      Prior to Admission medications   Medication Sig Start Date End Date Taking? Authorizing Provider  Accu-Chek Softclix Lancets lancets USE TO TEST BLOOD SUGAR 4 TIMES A DAY. 05/28/22   Dani Gobble, NP  albuterol (VENTOLIN HFA) 108 (90 Base) MCG/ACT inhaler Inhale 2 puffs into the lungs every 6 (six) hours as needed for wheezing or shortness of breath. 07/17/22   Leath-Warren, Sadie Haber, NP  ARIPiprazole (ABILIFY) 20 MG tablet Take 20 mg by mouth daily. 08/02/23   [provider]  clonazePAM (KLONOPIN) 0.5 MG tablet Take 0.5 mg by mouth 2 (two)  times daily as needed for anxiety. 08/02/23   [provider]  Continuous Glucose Sensor (DEXCOM G6 SENSOR) MISC Change sensor every 10 days. 03/24/23   Dani Gobble, NP  Continuous Glucose Transmitter (DEXCOM G6 TRANSMITTER) MISC USE AS DIRECTED. 03/24/23   Dani Gobble, NP  cyclobenzaprine (FLEXERIL) 10 MG tablet Take 1 tablet (10 mg total) by mouth 3 (three) times daily as needed for muscle spasms. 01/26/23   Del Nigel Berthold, FNP  doxycycline (VIBRAMYCIN) 100 MG capsule Take 1 capsule (100 mg total) by mouth 2 (two) times daily for 10 days. 09/14/23 09/24/23  Del Nigel Berthold, FNP  enoxaparin (LOVENOX) 100 MG/ML injection Inject 1 mL (100 mg total) into the skin every 12 (twelve) hours. 09/13/23   Carnella Guadalajara, PA-C  famotidine (PEPCID) 20 MG tablet Take 1 tablet (20 mg total)  by mouth 2 (two) times daily. 01/07/23   Rondel Baton, MD  ferrous sulfate 325 (65 FE) MG EC tablet Take 1 tablet (325 mg total) by mouth daily with breakfast. 09/13/23   Rojelio Brenner M, PA-C  glipiZIDE (GLUCOTROL XL) 5 MG 24 hr tablet take 1 tablet by mouth daily with breakfast. 08/10/23   Dani Gobble, NP  glucose blood (ACCU-CHEK GUIDE) test strip Use as instructed to check blood glucose four times daily 12/01/21   Dani Gobble, NP  hydrOXYzine (VISTARIL) 50 MG capsule Take 1 capsule (50 mg total) by mouth 3 (three) times daily as needed. 05/25/23   Del Newman Nip, Tenna Child, FNP  hyoscyamine (ANASPAZ) 0.125 MG TBDP disintergrating tablet TAKE 1 TABLET UNDER THE TONGUE THREE TIMES A DAY AS NEEDED FOR BLADDER SPASMS OR CRAMPING. 05/10/23   Carlan, Chelsea L, NP  Insulin Pen Needle (GLOBAL EASE INJECT PEN NEEDLES) 31G X 8 MM MISC Use 1 pen needle 4 times daily for insulin. 11/10/21   Dani Gobble, NP  Insulin Pen Needle (PEN NEEDLES) 32G X 6 MM MISC 1 each by Does not apply route 3 (three) times daily. 02/26/21   Dani Gobble, NP  insulin regular human CONCENTRATED (HUMULIN R U-500 KWIKPEN) 500 UNIT/ML KwikPen Inject 90 Units into the skin 3 (three) times daily with meals. 03/24/23   Dani Gobble, NP  lidocaine (XYLOCAINE) 5 % ointment Apply 1 Application topically as needed. 04/27/23   Del Newman Nip, Tenna Child, FNP  LINZESS 290 MCG CAPS capsule Take 290 mcg by mouth daily. 10/16/22   [provider]  lubiprostone (AMITIZA) 24 MCG capsule Take 1 capsule (24 mcg total) by mouth 2 (two) times daily with a meal. 09/02/22   Marguerita Merles, Reuel Boom, MD  metFORMIN (GLUCOPHAGE) 1000 MG tablet take 1 tablet by mouth 2 times a day with a meal. 08/10/23   Reardon, Freddi Starr, NP  omeprazole (PRILOSEC) 40 MG capsule TAKE 1 CAPSULE BY MOUTH TWICE DAILY. 05/10/23   Carlan, Chelsea L, NP  ondansetron (ZOFRAN) 4 MG tablet Take 1 tablet (4 mg total) by mouth every 8 (eight) hours  as needed for nausea or vomiting. 03/08/23   Carlan, Chelsea L, NP  Oxycodone HCl 10 MG TABS Take 10 mg by mouth 4 (four) times daily as needed. 09/03/23   [provider]  pantoprazole (PROTONIX) 20 MG tablet Take 1 tablet by mouth once daily 06/21/23   Del Newman Nip, Tenna Child, FNP  potassium chloride (KLOR-CON) 10 MEQ tablet Take 10 mEq by mouth 2 (two) times daily. 08/10/23   [provider]  pregabalin (LYRICA) 75  MG capsule Take 75 mg by mouth 3 (three) times daily. 09/03/23 10/03/23  [provider]  propranolol (INDERAL) 10 MG tablet Take 10 mg by mouth 3 (three) times daily. 08/02/23   [provider]  sucralfate (CARAFATE) 1 GM/10ML suspension Take 10 mLs (1 g total) by mouth 4 (four) times daily. 03/08/23   Carlan, Chelsea L, NP  tirzepatide [facility]) 2.5 MG/0.5ML Pen Inject 2.5 mg into the skin once a week. 07/26/23   Dani Gobble, NP  torsemide (DEMADEX) 20 MG tablet Take 20 mg by mouth 2 (two) times daily. 09/14/23   [provider]  venlafaxine XR (EFFEXOR XR) 75 MG 24 hr capsule Take 1 capsule (75 mg total) by mouth daily with breakfast. 05/25/23   Del Newman Nip, Tenna Child, FNP  Vitamin D, Ergocalciferol, (DRISDOL) 1.25 MG (50000 UNIT) CAPS capsule Take 1 capsule (50,000 Units total) by mouth every 7 (seven) days. Patient taking differently: Take 50,000 Units by mouth every Tuesday. 12/10/21   Dani Gobble, NP    Physical Exam: BP 120/81 (BP Location: Right Arm)   Pulse 97   Temp 98.5 F (36.9 C) (Oral)   Resp 18   Ht 5\' 9"  (1.753 m)   Wt 112.5 kg   LMP  (LMP Unknown)   SpO2 94%   BMI 36.63 kg/m   General: 37 y.o. year-old female ill appearing, but in no acute distress.  Alert and oriented x3. HEENT: NCAT, EOMI Neck: Supple, trachea medial Cardiovascular: Regular rate and rhythm with no rubs or gallops.  No thyromegaly or JVD noted.  No lower extremity edema. 2/4 pulses in all 4 extremities. Respiratory: Clear to auscultation  with no wheezes or rales. Good inspiratory effort. Abdomen: Soft, nontender nondistended with normal bowel sounds x4 quadrants. Muskuloskeletal: Left leg swelling tender to touch and more swollen than RLE.  Neuro: CN II-XII intact, strength 5/5 x 4, sensation, reflexes intact Skin: No ulcerative lesions noted or rashes Psychiatry: Judgement and insight appear normal. Mood is appropriate for condition and setting          Labs on Admission:  Basic Metabolic Panel: Recent Labs  Lab 09/13/23 1425 09/14/23 1703 09/15/23 1747  NA 136 132* 136  K 3.4* 3.9 4.0  CL 102 102 100  CO2 26 22 24   GLUCOSE 323* 486* 382*  BUN 7 5* 6  CREATININE 0.54 0.59 0.59  CALCIUM 8.2* 8.1* 8.5*   Liver Function Tests: Recent Labs  Lab 09/13/23 1425 09/14/23 1806 09/15/23 1747  AST 25 21 22   ALT 27 21 24   ALKPHOS 170* 153* 196*  BILITOT 1.2* 1.2* 1.4*  PROT 6.3* 5.6* 7.2  ALBUMIN 2.8* 2.5* 3.2*   No results for input(s): "LIPASE", "AMYLASE" in the last 168 hours. No results for input(s): "AMMONIA" in the last 168 hours. CBC: Recent Labs  Lab 09/13/23 1425 09/14/23 1703 09/15/23 1747  WBC 8.3 7.5 8.2  NEUTROABS 6.5 5.9 6.7  HGB 13.1 12.6 14.2  HCT 42.1 40.2 44.4  MCV 90.5 91.4 91.4  PLT 114* 102* 115*   Cardiac Enzymes: No results for input(s): "CKTOTAL", "CKMB", "CKMBINDEX", "TROPONINI" in the last 168 hours.  BNP (last 3 results) Recent Labs    09/14/23 1806 09/15/23 1747  BNP 102.0* 81.0    ProBNP (last 3 results) No results for input(s): "PROBNP" in the last 8760 hours.  CBG: Recent Labs  Lab 09/14/23 1624 09/15/23 1529 09/15/23 1851 09/16/23 0221  GLUCAP 496* 406* 332* 376*    Radiological  Exams on Admission: CT VENOGRAM ABD/PEL  Result Date: 09/15/2023 CLINICAL DATA:  DVT EXAM: CT VENOGRAM ABDOMEN AND PELVIS TECHNIQUE: Venographic phase images of the abdomen, pelvis and lower extremities were obtained following the administration of intravenous contrast.  Multiplanar reformats and maximum intensity projections were generated. RADIATION DOSE REDUCTION: This exam was performed according to the departmental dose-optimization program which includes automated exposure control, adjustment of the mA and/or kV according to patient size and/or use of iterative reconstruction technique. CONTRAST:  OMNIPAQUE IOHEXOL 350 MG/ML SOLN COMPARISON:  Ultrasound 09/13/2023, CT 01/07/2023, 11/08/2021 FINDINGS: Lower chest: See separately dictated chest CT. Small left-sided pleural effusion with consolidation at the left lung base. Hepatobiliary: Nodular hepatic contour suspicious for cirrhosis. Cholecystectomy. No biliary dilatation. Pancreas: Unremarkable. No pancreatic ductal dilatation or surrounding inflammatory changes. Spleen: Massively enlarged, craniocaudal measurement of 26 cm, previously 26 cm. Interval embolization coils at the splenic hilus. Heterogenous splenic enhancement with multiple geographic areas of non enhancement presumably due to infarcts. No evidence for hematoma. Extensive perisplenic collateral vessels. Adrenals/Urinary Tract: Adrenal glands are normal. Kidneys show no hydronephrosis. The bladder is decompressed Stomach/Bowel: The stomach is nonenlarged. There is no dilated small bowel. No acute bowel wall thickening. Vascular/Lymphatic: Nonaneurysmal aorta. No suspicious lymph nodes. Small retroperitoneal lymph nodes. Reproductive: Uterus and bilateral adnexa are unremarkable. Other: No free air. Moderate volume of ascites. Subcutaneous edema within the anterior pannus Musculoskeletal: No acute osseous abnormality IVC: IVC filter with apex at the L3 level. Thrombus within the IVC filter. Extension of thrombus above the filter apex approximately 2.6 cm cranial caudal, extends to just below the right renal vein origin. Portal and mesenteric veins: Small volume thrombus in the portal vein, series 5, image 34. question tiny amount of thrombus within the SMV,  series 5, image 45. Bilateral iliac veins: Right iliac vein is patent. Positive for occlusive thrombus within the left common iliac vein and external iliac vein. Right lower extremity: No thrombus within the imaged portions of the right common femoral, deep femoral or femoral veins. Left lower extremity: Positive for occlusive thrombus within the left common femoral and imaged portions of the proximal left femoral vein. Small volume thrombus within the proximal deep femoral vein. IMPRESSION: 1. Positive for occlusive DVT within the left lower extremity as seen on prior ultrasound imaging. There is extension of occlusive thrombus to involve the left external iliac and common iliac veins. The distal IVC is patent. 2. IVC filter is present. Positive for thrombus within the IVC filter with extension of thrombus about 2.6 cm above the filter apex extending to just below the right renal vein origin. 3. Small volume thrombus in the portal vein. Question tiny amount of thrombus within the SMV. 4. Massively enlarged spleen with interval embolization coils at the splenic hilus. Heterogenous splenic enhancement with multiple geographic areas of non enhancement presumably due to infarcts. No evidence for splenic hematoma. 5. Cirrhosis with evidence for portal hypertension and moderate volume of ascites. 6. Small left-sided pleural effusion with consolidation at the left lung base. Electronically Signed   By: Jasmine Pang M.D.   On: 09/15/2023 23:12   CT Angio Chest PE W and/or Wo Contrast  Result Date: 09/15/2023 CLINICAL DATA:  Shortness of breath EXAM: CT ANGIOGRAPHY CHEST WITH CONTRAST TECHNIQUE: Multidetector CT imaging of the chest was performed using the standard protocol during bolus administration of intravenous contrast. Multiplanar CT image reconstructions and MIPs were obtained to evaluate the vascular anatomy. RADIATION DOSE REDUCTION: This exam was performed according to  the departmental dose-optimization  program which includes automated exposure control, adjustment of the mA and/or kV according to patient size and/or use of iterative reconstruction technique. CONTRAST:  OMNIPAQUE IOHEXOL 350 MG/ML SOLN COMPARISON:  Chest x-ray 09/14/2023, CT chest 07/29/2020 FINDINGS: Cardiovascular: Suboptimal opacification of the pulmonary arterial system. No definite acute pulmonary embolus to the segmental level. Nonaneurysmal aorta. Cardiac size within normal limits. No pericardial effusion Mediastinum/Nodes: No enlarged mediastinal, hilar, or axillary lymph nodes. Thyroid gland, trachea, and esophagus demonstrate no significant findings. Lungs/Pleura: Small left-sided pleural effusion. Consolidative airspace disease in the left lower lobe. Small ground-glass focus at the right apex. Upper Abdomen: See separately dictated CT Musculoskeletal: No acute osseous abnormality Review of the MIP images confirms the above findings. IMPRESSION: 1. Suboptimal opacification of the pulmonary arterial system. No definite acute pulmonary embolus to the segmental level. 2. Small left-sided pleural effusion with consolidative airspace disease in the left lower lobe, either representing atelectasis or pneumonia. Small ground-glass focus at the right apex, suspect infectious in etiology. Electronically Signed   By: Jasmine Pang M.D.   On: 09/15/2023 22:52   DG Chest 2 View  Result Date: 09/14/2023 CLINICAL DATA:  SOB EXAM: CHEST - 2 VIEW COMPARISON:  X-ray 01/16/2023, CT chest 07/29/2020 FINDINGS: The heart and mediastinal contours are within normal limits. Bibasilar atelectasis. No focal consolidation. No pulmonary edema. No pleural effusion. No pneumothorax. No acute osseous abnormality. IMPRESSION: No active cardiopulmonary disease. Electronically Signed   By: Tish Frederickson M.D.   On: 09/14/2023 19:52    EKG: I independently viewed the EKG done and my findings are as followed: Rate 117 bpm  Assessment/Plan Present on  Admission:  Acute deep vein thrombosis (DVT) of left lower extremity (HCC)  Hyperglycemia due to type 2 diabetes mellitus (HCC)  Gastroesophageal reflux disease  Thrombocytopenia due to hypersplenism  Tobacco use disorder  CAP (community acquired pneumonia)  Principal Problem:   Acute deep vein thrombosis (DVT) of left lower extremity (HCC) Active Problems:   CAP (community acquired pneumonia)   Gastroesophageal reflux disease   Hyperglycemia due to type 2 diabetes mellitus (HCC)   Tobacco use disorder   Thrombocytopenia due to hypersplenism   Elevated d-dimer   Elevated brain natriuretic peptide (BNP) level   Hypoalbuminemia due to protein-calorie malnutrition (HCC)   Acute on chronic DVT of LLE CT venogram of abdomen and pelvis was positive for occlusive DVT within the left lower extremity. Positive for thrombus within the IVC filter with extension of thrombus about 2.6 cm above the filter apex extending to just below the right renal vein origin. Hold home Lovenox pending being seen by vascular surgery Vascular surgery (Dr. Myra Gianotti) was consulted and recommended admitting patient to [facility] with plan to consult on patient on arrival to the ED  Uncontrolled type 2 diabetes mellitus with hyperglycemia Hemoglobin A1c on 07/25/2023 was 9.4 Continue Semglee 15 units nightly and adjust dose accordingly  Glipizide and metformin will be held at this time  Elevated D-dimer D-dimer was 19.12 CT angiogram of your chest with contrast rule out pulmonary embolism  Elevated BNP BNP 102 > 81.0 Continue total input/output, daily weights and fluid restriction Continue heart healthy/modified carb diet Continue Cardiac diet  Echocardiogram in the morning   Possible CAP POA CT angiography of chest showed small  left-sided pleural effusion with consolidative airspace disease in the left lower lobe, either representing atelectasis or pneumonia. Small ground-glass focus at the right apex,  suspect infectious in etiology. Patient will be  started on ceftriaxone and azithromycin, we shall continue same at this time with plan to de-escalate/discontinue based on blood culture, sputum culture, urine Legionella, strep pneumo and procalcitonin Continue Tylenol as needed Continue Mucinex, incentive spirometry, flutter valve   Hypoalbuminemia possibly secondary to moderate protein calorie malnutrition Albumin 2.5, protein supplement will be provided  GERD Continue Protonix  Chronic thrombocytopenia due to cirrhosis Platelets 102 > 115 Continue to monitor platelet levels  Tobacco use disorder Patient was counseled on tobacco abuse cessation Continue nicotine patch   DVT prophylaxis: Lovenox  Code Status: Full code  Family Communication: None at bedside  Consults: Vascular surgery (by ED team)  Severity of Illness: The appropriate patient status for this patient is INPATIENT. Inpatient status is judged to be reasonable and necessary in order to provide the required intensity of service to ensure the patient's safety. The patient's presenting symptoms, physical exam findings, and initial radiographic and laboratory data in the context of their chronic comorbidities is felt to place them at high risk for further clinical deterioration. Furthermore, it is not anticipated that the patient will be medically stable for discharge from the hospital within 2 midnights of admission.   * I certify that at the point of admission it is my clinical judgment that the patient will require inpatient hospital care spanning beyond 2 midnights from the point of admission due to high intensity of service, high risk for further deterioration and high frequency of surveillance required.*  Author: Frankey Shown, DO 09/16/2023 3:12 AM  For on call review [email or web address].

## 2023-09-15 NOTE — ED Provider Notes (Signed)
[place] EMERGENCY DEPARTMENT AT [facility] Provider Note   CSN: [id number] Arrival date & time: 09/15/23  1401     History  Chief Complaint  Patient presents with   Leg Pain    Jaime [name] is a 37 y.o. female with past medical history significant for DM2, hepatitis C, GERD, DVT, tobacco dependence,  presents to the ED complaining of shortness of breath.  Patient reports she has completed the antibiotics for pneumonia, but does not feel better.  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  She reports her leg continues to be painful and feels more swollen.  Patient has been taking Tylenol with minimal relief of symptoms.  She states she has been short of breath for the past week.  Denies history of PE.  Patient was seen yesterday in ED for same.   Patient had IVC filter placed on 08/28/23 during hospital stay at [facility].  She restarted her Lovenox yesterday and has had 2 total doses.  She states she also began having nausea and sore throat that began today.  Denies vomiting, abdominal pain, diarrhea, fever, cough, congestion, chest pain, palpitations.    Patient has not had her diabetic medications today.     Home Medications Prior to Admission medications   Medication Sig Start Date End Date Taking? Authorizing Provider  Accu-Chek Softclix Lancets lancets USE TO TEST BLOOD SUGAR 4 TIMES A DAY. 05/28/22   Dani Gobble, NP  albuterol (VENTOLIN HFA) 108 (90 Base) MCG/ACT inhaler Inhale 2 puffs into the lungs every 6 (six) hours as needed for wheezing or shortness of breath. 07/17/22   Leath-Warren, Sadie Haber, NP  ARIPiprazole (ABILIFY) 20 MG tablet Take 20 mg by mouth daily. 08/02/23   [provider]  clonazePAM (KLONOPIN) 0.5 MG tablet Take 0.5 mg by mouth 2 (two) times daily as needed for anxiety. 08/02/23   [provider]  Continuous Glucose Sensor (DEXCOM G6 SENSOR) MISC Change sensor every 10 days. 03/24/23   Dani Gobble, NP  Continuous Glucose Transmitter (DEXCOM G6 TRANSMITTER) MISC USE AS DIRECTED. 03/24/23   Dani Gobble, NP  cyclobenzaprine (FLEXERIL) 10 MG tablet Take 1 tablet (10 mg total) by mouth 3 (three) times daily as needed for muscle spasms. 01/26/23   Del Nigel Berthold, FNP  doxycycline (VIBRAMYCIN) 100 MG capsule Take 1 capsule (100 mg total) by mouth 2 (two) times daily for 10 days. 09/14/23 09/24/23  Del Nigel Berthold, FNP  enoxaparin (LOVENOX) 100 MG/ML injection Inject 1 mL (100 mg total) into the skin every 12 (twelve) hours. 09/13/23   Carnella Guadalajara, PA-C  famotidine (PEPCID) 20 MG tablet Take 1 tablet (20 mg total) by mouth 2 (two) times daily. 01/07/23   Rondel Baton, MD  ferrous sulfate 325 (65 FE) MG EC tablet Take 1 tablet (325 mg total) by mouth daily with breakfast. 09/13/23   Rojelio Brenner M, PA-C  glipiZIDE (GLUCOTROL XL) 5 MG 24 hr tablet take 1 tablet by mouth daily with breakfast. 08/10/23   Dani Gobble, NP  glucose blood (ACCU-CHEK GUIDE) test strip Use as instructed to check blood glucose four times daily 12/01/21   Dani Gobble, NP  hydrOXYzine (VISTARIL) 50 MG capsule Take 1 capsule (50 mg total) by mouth 3 (three) times daily as needed. 05/25/23   Del Newman Nip, Tenna Child, FNP  hyoscyamine (ANASPAZ) 0.125 MG TBDP disintergrating tablet TAKE 1 TABLET UNDER THE TONGUE THREE  TIMES A DAY AS NEEDED FOR BLADDER SPASMS OR CRAMPING. 05/10/23   Carlan, Chelsea L, NP  Insulin Pen Needle (GLOBAL EASE INJECT PEN NEEDLES) 31G X 8 MM MISC Use 1 pen needle 4 times daily for insulin. 11/10/21   Dani Gobble, NP  Insulin Pen Needle (PEN NEEDLES) 32G X 6 MM MISC 1 each by Does not apply route 3 (three) times daily. 02/26/21   Dani Gobble, NP  insulin regular human CONCENTRATED (HUMULIN R U-500 KWIKPEN) 500 UNIT/ML KwikPen Inject 90 Units into the skin 3 (three) times daily with meals. 03/24/23   Dani Gobble, NP  lidocaine (XYLOCAINE) 5 %  ointment Apply 1 Application topically as needed. 04/27/23   Del Newman Nip, Tenna Child, FNP  LINZESS 290 MCG CAPS capsule Take 290 mcg by mouth daily. 10/16/22   [provider]  lubiprostone (AMITIZA) 24 MCG capsule Take 1 capsule (24 mcg total) by mouth 2 (two) times daily with a meal. 09/02/22   Marguerita Merles, Reuel Boom, MD  metFORMIN (GLUCOPHAGE) 1000 MG tablet take 1 tablet by mouth 2 times a day with a meal. 08/10/23   Reardon, Freddi Starr, NP  omeprazole (PRILOSEC) 40 MG capsule TAKE 1 CAPSULE BY MOUTH TWICE DAILY. 05/10/23   Carlan, Chelsea L, NP  ondansetron (ZOFRAN) 4 MG tablet Take 1 tablet (4 mg total) by mouth every 8 (eight) hours as needed for nausea or vomiting. 03/08/23   Carlan, Chelsea L, NP  Oxycodone HCl 10 MG TABS Take 10 mg by mouth 4 (four) times daily as needed. 09/03/23   [provider]  pantoprazole (PROTONIX) 20 MG tablet Take 1 tablet by mouth once daily 06/21/23   Del Newman Nip, Tenna Child, FNP  potassium chloride (KLOR-CON) 10 MEQ tablet Take 10 mEq by mouth 2 (two) times daily. 08/10/23   [provider]  pregabalin (LYRICA) 75 MG capsule Take 75 mg by mouth 3 (three) times daily. 09/03/23 10/03/23  [provider]  propranolol (INDERAL) 10 MG tablet Take 10 mg by mouth 3 (three) times daily. 08/02/23   [provider]  sucralfate (CARAFATE) 1 GM/10ML suspension Take 10 mLs (1 g total) by mouth 4 (four) times daily. 03/08/23   Carlan, Chelsea L, NP  tirzepatide [facility]) 2.5 MG/0.5ML Pen Inject 2.5 mg into the skin once a week. 07/26/23   Dani Gobble, NP  torsemide (DEMADEX) 20 MG tablet Take 20 mg by mouth 2 (two) times daily. 09/14/23   [provider]  venlafaxine XR (EFFEXOR XR) 75 MG 24 hr capsule Take 1 capsule (75 mg total) by mouth daily with breakfast. 05/25/23   Del Newman Nip, Tenna Child, FNP  Vitamin D, Ergocalciferol, (DRISDOL) 1.25 MG (50000 UNIT) CAPS capsule Take 1 capsule (50,000 Units total) by mouth every 7  (seven) days. Patient taking differently: Take 50,000 Units by mouth every Tuesday. 12/10/21   Dani Gobble, NP      Allergies    Iodine-131, Ivp dye [iodinated contrast media], Ketorolac tromethamine, Tylenol [acetaminophen], Vancomycin, Gabapentin, Nsaids, and Suboxone [buprenorphine hcl-naloxone hcl]    Review of Systems   Review of Systems  Constitutional:  Negative for fever.  HENT:  Positive for sore throat. Negative for congestion.   Respiratory:  Positive for shortness of breath. Negative for cough.   Cardiovascular:  Negative for chest pain and palpitations.  Gastrointestinal:  Positive for nausea. Negative for vomiting.  Musculoskeletal:        Left leg swelling    Physical Exam Updated Vital  Signs BP 121/80   Pulse 98   Temp 98.4 F (36.9 C) (Oral)   Resp 17   Ht 5\' 9"  (1.753 m)   Wt 112.5 kg   LMP  (LMP Unknown)   SpO2 97%   BMI 36.63 kg/m  Physical Exam Vitals and nursing note reviewed.  Constitutional:      General: She is not in acute distress.    Appearance: She is obese. She is ill-appearing. She is not diaphoretic.     Comments: Hygiene is poor.  Cardiovascular:     Rate and Rhythm: Regular rhythm. Tachycardia present.     Pulses:          Dorsalis pedis pulses are 2+ on the right side and 2+ on the left side.     Heart sounds: Normal heart sounds.  Pulmonary:     Effort: Pulmonary effort is normal. No tachypnea, accessory muscle usage or respiratory distress.     Breath sounds: Normal breath sounds and air entry.     Comments: Patient speaking in full sentences. Abdominal:     General: Abdomen is flat.     Palpations: Abdomen is soft.     Tenderness: There is no abdominal tenderness.    Musculoskeletal:     Right lower leg: No edema.     Left lower leg: Tenderness (calf) present. 2+ Pitting Edema present.  Skin:    General: Skin is warm and dry.     Capillary Refill: Capillary refill takes less than 2 seconds.  Neurological:     Mental  Status: She is alert. Mental status is at baseline.  Psychiatric:        Mood and Affect: Mood normal.        Behavior: Behavior normal.     ED Results / Procedures / Treatments   Labs (all labs ordered are listed, but only abnormal results are displayed) Labs Reviewed  COMPREHENSIVE METABOLIC PANEL - Abnormal; Notable for the following components:      Result Value   Glucose, Bld 382 (*)    Calcium 8.5 (*)    Albumin 3.2 (*)    Alkaline Phosphatase 196 (*)    Total Bilirubin 1.4 (*)    All other components within normal limits  CBC WITH DIFFERENTIAL/PLATELET - Abnormal; Notable for the following components:   RDW 15.6 (*)    Platelets 115 (*)    All other components within normal limits  CBG MONITORING, ED - Abnormal; Notable for the following components:   Glucose-Capillary 406 (*)    All other components within normal limits  CBG MONITORING, ED - Abnormal; Notable for the following components:   Glucose-Capillary 332 (*)    All other components within normal limits  BRAIN NATRIURETIC PEPTIDE  HCG, SERUM, QUALITATIVE  TROPONIN I (HIGH SENSITIVITY)  TROPONIN I (HIGH SENSITIVITY)    EKG EKG Interpretation Date/Time:  Wednesday September 15 2023 14:08:27 EST Ventricular Rate:  117 PR Interval:  148 QRS Duration:  72 QT Interval:  328 QTC Calculation: 457 R Axis:   17  Text Interpretation: Sinus tachycardia Cannot rule out Anterior infarct , age undetermined Abnormal ECG Confirmed by Vonita Moss [phone]) on 09/15/2023 4:51:34 PM  Radiology DG Chest 2 View  Result Date: 09/14/2023 CLINICAL DATA:  SOB EXAM: CHEST - 2 VIEW COMPARISON:  X-ray 01/16/2023, CT chest 07/29/2020 FINDINGS: The heart and mediastinal contours are within normal limits. Bibasilar atelectasis. No focal consolidation. No pulmonary edema. No pleural effusion. No pneumothorax. No acute osseous abnormality.  IMPRESSION: No active cardiopulmonary disease. Electronically Signed   By: Tish Frederickson  M.D.   On: 09/14/2023 19:52    Procedures Procedures    Medications Ordered in ED Medications  diphenhydrAMINE (BENADRYL) capsule 50 mg (has no administration in time range)    Or  diphenhydrAMINE (BENADRYL) injection 50 mg (has no administration in time range)  ondansetron (ZOFRAN) injection 4 mg (4 mg Intravenous Given 09/15/23 1749)  insulin aspart (novoLOG) injection 11 Units (11 Units Subcutaneous Given 09/15/23 1712)  methylPREDNISolone sodium succinate (SOLU-MEDROL) 40 mg/mL injection 40 mg (40 mg Intravenous Given 09/15/23 1747)    ED Course/ Medical Decision Making/ A&P Clinical Course as of 09/15/23 1854  Wed Sep 15, 2023  1845 Allergy protocol CTPE and venogram will happen at 9:45pm. Notable d-dimer yesterday so concern for PE in setting of large clot burden seen on [place] of left leg. Restarted Lovenox last night. May need vascular consult depending on imaging findings. [OZ]    Clinical Course User Index [OZ] Smitty Knudsen, PA-C                                 Medical Decision Making  This patient presents to the ED with chief complaint(s) of shortness of breath, worsening leg pain with pertinent past medical history of DVT on Lovenox, DM2.  The complaint involves an extensive differential diagnosis and also carries with it a high risk of complications and morbidity.    The differential diagnosis includes PE, iliac vein thrombosis, cellulitis, worsening DVT, heart strain, ACS   The initial plan is to obtain labs  Additional history obtained: Records reviewed previous admission documents; reviewed results of US done on 09/13/23.  Patient with extensive left sided DVT extending from common femoral to popliteal veins.  Extension to include imaged portions of profunda femoral and great saphenous vein as well.  Radiologist recommended CT venogram as follow up to evaluate for pelvis versus caval extension.     Initial Assessment:   Exam significant for ill-appearing patient  who is not in acute respiratory distress.  Hygiene is poor.  Left leg 2+ edema, pitting in the foot, but no pre-tibial pitting edema.  Leg is mildly erythematous with increased warmth.  Tenderness to palpation over the calf and with palpation of the inguinal region.  No tenderness to the left upper leg.  Abdomen is soft and non tender.  Lungs are clear to auscultation bilaterally.  Patient is speaking in full sentences.  Heart rate is mildly tachycardic with regular rhythm.  2+ bilateral DP pulses.  Patient is afebrile and without hypoxia.    Ordered CT venogram abdomen and pelvis as well as CT angio PE study given large clot burden and patient's symptoms.    Independent ECG/labs interpretation:  The following labs were independently interpreted:  POC CBG 406. CBC without leukocytosis or anemia.  There is thrombocytopenia, but appears to be improving from prior.  Metabolic panel with hyperglycemia and elevated alk phos and bilirubin.  No major electrolyte disturbance.   Initial troponin negative.  BNP within normal limits.    Treatment and Reassessment: Patient given subcutaneous insulin for hyperglycemia.  She has not had her diabetic medications today.    Disposition:   6:54 PM Care of patient transferred to [facility], PA-C at the end of my shift as the patient will require reassessment once labs/imaging have resulted. Patient presentation, ED course, and plan of care discussed  with review of all pertinent labs and imaging. Please see his/her note for further details regarding further ED course and disposition. Plan at time of handoff is follow up on imaging.  At this time, it is planned for 21:47 since patient required steroid/diphenhydramine protocol (contrast allergy).  May need to consult vascular depending on findings of imaging.  May need to consider hospital admission also depending on workup results.  This may be altered or completely changed at the discretion of the oncoming team pending  results of further workup.   Social Determinants of Health:   Patient's  tobacco dependence  increases the complexity of managing their presentation         Final Clinical Impression(s) / ED Diagnoses Final diagnoses:  None    Rx / DC Orders ED Discharge Orders     None         Lenard Simmer, PA-C 09/15/23 1854    Rondel Baton, MD 09/18/23 1108

## 2023-09-15 NOTE — ED Notes (Signed)
Pt asked if I could check her glucose as she usually takes 90 units of humulin every morning but did not take any today.

## 2023-09-15 NOTE — ED Notes (Signed)
Pt given sandwich and diet soda

## 2023-09-15 NOTE — ED Provider Notes (Signed)
Patient is a handoff from [place], [place]. Plan is to wait for CT imaging for dispo. May require vascular surgery consultation given findings. Physical Exam  BP 137/82 (BP Location: Left Arm)   Pulse 97   Temp 98.4 F (36.9 C) (Oral)   Resp 18   Ht 5\' 9"  (1.753 m)   Wt 112.5 kg   LMP  (LMP Unknown)   SpO2 98%   BMI 36.63 kg/m   Physical Exam Vitals and nursing note reviewed.  Constitutional:      General: She is not in acute distress.    Appearance: She is obese. She is ill-appearing. She is not diaphoretic.     Comments: Hygiene is poor.  Cardiovascular:     Rate and Rhythm: Regular rhythm. Tachycardia present.     Pulses:          Dorsalis pedis pulses are 2+ on the right side and 2+ on the left side.     Heart sounds: Normal heart sounds.  Pulmonary:     Effort: Pulmonary effort is normal. No tachypnea, accessory muscle usage or respiratory distress.     Breath sounds: Normal breath sounds and air entry.     Comments: Patient speaking in full sentences. Abdominal:     General: Abdomen is flat.     Palpations: Abdomen is soft.     Tenderness: There is no abdominal tenderness.    Musculoskeletal:     Right lower leg: No edema.     Left lower leg: Tenderness (calf) present. 2+ Pitting Edema present.  Skin:    General: Skin is warm and dry.     Capillary Refill: Capillary refill takes less than 2 seconds.  Neurological:     Mental Status: She is alert. Mental status is at baseline.  Psychiatric:        Mood and Affect: Mood normal.        Behavior: Behavior normal.     Procedures  Procedures  ED Course / MDM   Clinical Course as of 09/15/23 2344  Wed Sep 15, 2023  1845 Allergy protocol CTPE and venogram will happen at 9:45pm. Notable d-dimer yesterday so concern for PE in setting of large clot burden seen on [place] of left leg. Restarted Lovenox last night. May need vascular consult depending on imaging findings. [OZ]  2252 Dr Earle Gell from vascular surgery consulted.  Says that she may be eligible for intervention. Recommends that she be admitted to [facility] and they will see her tomorrow.  [RP]    Clinical Course User Index [OZ] Smitty Knudsen, PA-C [RP] Rondel Baton, MD   Medical Decision Making Amount and/or Complexity of Data Reviewed Labs: ordered. Radiology: ordered.  Risk Prescription drug management.   Patient is a handoff from [place], [place]. Please see her note and physical exam findings. Plan is to wait for CT imaging for dispo. May require vascular surgery consultation given findings.  Dr. Eloise Harman spoke with Dr. Myra Gianotti, vascular surgery, who expressed concerns given her CT findings. Feels that vascular surgery may be able to intervene to help remove large clot burden now that platelets have improved, but would advise transfer to [facility] for vascular evaluation tomorrow. Unsure of exact timing of surgery for her at this point.  Spoke with Dr. Thomes Dinning, hospitalist, who will be admitting patient for transfer to [facility] for vascular evaluation. Patient informed of this plan and is in agreement. No other acute concerns at this time.       Maryanna Shape  A, PA-C 09/15/23 2344    Rondel Baton, MD 09/18/23 [phone]

## 2023-09-15 NOTE — ED Triage Notes (Addendum)
Pt reports she has had a DVT in her left leg for a "few months" and has been taking lovenox but it has continued to stay painful and tylenol helps but it never goes away.  Pt also reports she has completed antibiotics for pna but still feels short of breath.  Pt was seen at this facility for same last night.

## 2023-09-16 ENCOUNTER — Ambulatory Visit (INDEPENDENT_AMBULATORY_CARE_PROVIDER_SITE_OTHER): Payer: MEDICAID | Admitting: Gastroenterology

## 2023-09-16 ENCOUNTER — Inpatient Hospital Stay (HOSPITAL_COMMUNITY): Payer: MEDICAID

## 2023-09-16 ENCOUNTER — Encounter (HOSPITAL_COMMUNITY)
Admission: EM | Disposition: A | Discharge status: Home / Self Care | Payer: Self-pay | Source: Home / Self Care | Attending: Internal Medicine

## 2023-09-16 ENCOUNTER — Other Ambulatory Visit (HOSPITAL_COMMUNITY): Payer: MEDICAID

## 2023-09-16 DIAGNOSIS — I871 Compression of vein: Secondary | ICD-10-CM

## 2023-09-16 DIAGNOSIS — I82522 Chronic embolism and thrombosis of left iliac vein: Secondary | ICD-10-CM | POA: Diagnosis not present

## 2023-09-16 DIAGNOSIS — I82412 Acute embolism and thrombosis of left femoral vein: Secondary | ICD-10-CM

## 2023-09-16 DIAGNOSIS — I82422 Acute embolism and thrombosis of left iliac vein: Principal | ICD-10-CM

## 2023-09-16 DIAGNOSIS — T82868A Thrombosis of vascular prosthetic devices, implants and grafts, initial encounter: Secondary | ICD-10-CM

## 2023-09-16 DIAGNOSIS — I82512 Chronic embolism and thrombosis of left femoral vein: Secondary | ICD-10-CM | POA: Diagnosis not present

## 2023-09-16 DIAGNOSIS — I824Y2 Acute embolism and thrombosis of unspecified deep veins of left proximal lower extremity: Secondary | ICD-10-CM

## 2023-09-16 DIAGNOSIS — E46 Unspecified protein-calorie malnutrition: Secondary | ICD-10-CM | POA: Insufficient documentation

## 2023-09-16 DIAGNOSIS — R7989 Other specified abnormal findings of blood chemistry: Secondary | ICD-10-CM | POA: Insufficient documentation

## 2023-09-16 HISTORY — PX: PERIPHERAL VASCULAR THROMBECTOMY: CATH118306

## 2023-09-16 HISTORY — PX: IVC FILTER REMOVAL: CATH118246

## 2023-09-16 LAB — COMPREHENSIVE METABOLIC PANEL
ALT: 22 U/L (ref 0–44)
AST: 22 U/L (ref 15–41)
Albumin: 2.5 g/dL — ABNORMAL LOW (ref 3.5–5.0)
Alkaline Phosphatase: 173 U/L — ABNORMAL HIGH (ref 38–126)
Anion gap: 7 (ref 5–15)
BUN: 6 mg/dL (ref 6–20)
CO2: 24 mmol/L (ref 22–32)
Calcium: 8.1 mg/dL — ABNORMAL LOW (ref 8.9–10.3)
Chloride: 101 mmol/L (ref 98–111)
Creatinine, Ser: 0.71 mg/dL (ref 0.44–1.00)
GFR, Estimated: >60 mL/min (ref >60)
Glucose, Bld: 250 mg/dL — ABNORMAL HIGH (ref 70–99)
Potassium: 3.8 mmol/L (ref 3.5–5.1)
Sodium: 132 mmol/L — ABNORMAL LOW (ref 135–145)
Total Bilirubin: 1.3 mg/dL — ABNORMAL HIGH (ref <1.2)
Total Protein: 6 g/dL — ABNORMAL LOW (ref 6.5–8.1)

## 2023-09-16 LAB — GLUCOSE, CAPILLARY
Glucose-Capillary: 376 mg/dL — ABNORMAL HIGH (ref 70–99)
Glucose-Capillary: 269 mg/dL — ABNORMAL HIGH (ref 70–99)
Glucose-Capillary: 214 mg/dL — ABNORMAL HIGH (ref 70–99)
Glucose-Capillary: 308 mg/dL — ABNORMAL HIGH (ref 70–99)
Glucose-Capillary: 530 mg/dL (ref 70–99)

## 2023-09-16 LAB — PROCALCITONIN: Procalcitonin: <0.1 ng/mL

## 2023-09-16 LAB — CBC
HCT: 39.7 % (ref 36.0–46.0)
Hemoglobin: 12.7 g/dL (ref 12.0–15.0)
MCH: 28.2 pg (ref 26.0–34.0)
MCHC: 32 g/dL (ref 30.0–36.0)
MCV: 88.2 fL (ref 80.0–100.0)
Platelets: 144 10*3/uL — ABNORMAL LOW (ref 150–400)
RBC: 4.5 MIL/uL (ref 3.87–5.11)
RDW: 14.9 % (ref 11.5–15.5)
WBC: 9.2 10*3/uL (ref 4.0–10.5)
nRBC: 0 % (ref 0.0–0.2)

## 2023-09-16 LAB — HIV ANTIBODY (ROUTINE TESTING W REFLEX): HIV Screen 4th Generation wRfx: NONREACTIVE

## 2023-09-16 LAB — POCT I-STAT, CHEM 8
BUN: 8 mg/dL (ref 6–20)
Calcium, Ion: 1.15 mmol/L (ref 1.15–1.40)
Chloride: 102 mmol/L (ref 98–111)
Creatinine, Ser: 0.3 mg/dL — ABNORMAL LOW (ref 0.44–1.00)
Glucose, Bld: 290 mg/dL — ABNORMAL HIGH (ref 70–99)
HCT: 33 % — ABNORMAL LOW (ref 36.0–46.0)
Hemoglobin: 11.2 g/dL — ABNORMAL LOW (ref 12.0–15.0)
Potassium: 4.6 mmol/L (ref 3.5–5.1)
Sodium: 136 mmol/L (ref 135–145)
TCO2: 27 mmol/L (ref 22–32)

## 2023-09-16 LAB — POCT ACTIVATED CLOTTING TIME
Activated Clotting Time: 129 s
Activated Clotting Time: 141 s

## 2023-09-16 LAB — PHOSPHORUS: Phosphorus: 2.4 mg/dL — ABNORMAL LOW (ref 2.5–4.6)

## 2023-09-16 LAB — MAGNESIUM: Magnesium: 1.8 mg/dL (ref 1.7–2.4)

## 2023-09-16 SURGERY — PERIPHERAL VASCULAR THROMBECTOMY
Anesthesia: LOCAL

## 2023-09-16 MED ORDER — SODIUM CHLORIDE 0.9% FLUSH: 3.0000 mL | INTRAVENOUS | Status: DC | PRN

## 2023-09-16 MED ORDER — HEPARIN SODIUM (PORCINE) 1000 UNIT/ML IJ SOLN
INTRAMUSCULAR | Status: DC | PRN
  Administered 2023-09-16: 8000 [IU] via INTRAVENOUS

## 2023-09-16 MED ORDER — METHYLPREDNISOLONE SODIUM SUCC 125 MG IJ SOLR
125.0000 mg | Freq: Once | INTRAMUSCULAR | Status: AC
  Administered 2023-09-16: 125 mg via INTRAVENOUS
  Filled 2023-09-16: qty 2

## 2023-09-16 MED ORDER — INSULIN ASPART 100 UNIT/ML IJ SOLN
15.0000 [IU] | Freq: Once | INTRAMUSCULAR | Status: AC
  Administered 2023-09-16: 15 [IU] via SUBCUTANEOUS

## 2023-09-16 MED ORDER — PANTOPRAZOLE SODIUM 40 MG PO TBEC
40.0000 mg | DELAYED_RELEASE_TABLET | Freq: Every day | ORAL | Status: DC
  Administered 2023-09-16 – 2023-09-17 (×2): 40 mg via ORAL
  Filled 2023-09-16 (×2): qty 1

## 2023-09-16 MED ORDER — INSULIN ASPART 100 UNIT/ML IJ SOLN
0.0000 [IU] | Freq: Three times a day (TID) | INTRAMUSCULAR | Status: DC
  Administered 2023-09-16: 11 [IU] via SUBCUTANEOUS
  Administered 2023-09-16: 5 [IU] via SUBCUTANEOUS
  Administered 2023-09-16: 8 [IU] via SUBCUTANEOUS
  Administered 2023-09-17: 5 [IU] via SUBCUTANEOUS

## 2023-09-16 MED ORDER — INSULIN ASPART 100 UNIT/ML IJ SOLN
5.0000 [IU] | Freq: Once | INTRAMUSCULAR | Status: AC
  Administered 2023-09-16: 5 [IU] via SUBCUTANEOUS

## 2023-09-16 MED ORDER — LABETALOL HCL 5 MG/ML IV SOLN: 10.0000 mg | INTRAVENOUS | Status: DC | PRN

## 2023-09-16 MED ORDER — INSULIN GLARGINE-YFGN 100 UNIT/ML ~~LOC~~ SOLN
15.0000 [IU] | Freq: Every day | SUBCUTANEOUS | Status: DC
  Administered 2023-09-16: 15 [IU] via SUBCUTANEOUS
  Filled 2023-09-16 (×3): qty 0.15

## 2023-09-16 MED ORDER — HYDRALAZINE HCL 20 MG/ML IJ SOLN: 5.0000 mg | INTRAMUSCULAR | Status: DC | PRN

## 2023-09-16 MED ORDER — MIDAZOLAM HCL 2 MG/2ML IJ SOLN
INTRAMUSCULAR | Status: DC | PRN
  Administered 2023-09-16: 1 mg via INTRAVENOUS

## 2023-09-16 MED ORDER — LIDOCAINE HCL (PF) 1 % IJ SOLN
INTRAMUSCULAR | Status: AC
  Filled 2023-09-16: qty 30

## 2023-09-16 MED ORDER — ONDANSETRON HCL 4 MG PO TABS: 4.0000 mg | ORAL_TABLET | Freq: Four times a day (QID) | ORAL | Status: DC | PRN

## 2023-09-16 MED ORDER — HEPARIN BOLUS VIA INFUSION
4000.0000 [IU] | Freq: Once | INTRAVENOUS | Status: AC
  Administered 2023-09-16: 4000 [IU] via INTRAVENOUS
  Filled 2023-09-16: qty 4000

## 2023-09-16 MED ORDER — DM-GUAIFENESIN ER 30-600 MG PO TB12
1.0000 | ORAL_TABLET | Freq: Two times a day (BID) | ORAL | Status: DC
  Administered 2023-09-16 – 2023-09-17 (×3): 1 via ORAL
  Filled 2023-09-16 (×4): qty 1

## 2023-09-16 MED ORDER — DIPHENHYDRAMINE HCL 50 MG/ML IJ SOLN
25.0000 mg | Freq: Once | INTRAMUSCULAR | Status: AC
  Administered 2023-09-16: 25 mg via INTRAVENOUS
  Filled 2023-09-16: qty 1

## 2023-09-16 MED ORDER — NICOTINE 21 MG/24HR TD PT24
21.0000 mg | MEDICATED_PATCH | Freq: Every day | TRANSDERMAL | Status: DC
  Administered 2023-09-16 – 2023-09-17 (×2): 21 mg via TRANSDERMAL
  Filled 2023-09-16 (×2): qty 1

## 2023-09-16 MED ORDER — SODIUM CHLORIDE 0.9% FLUSH: 3.0000 mL | Freq: Two times a day (BID) | INTRAVENOUS | Status: DC

## 2023-09-16 MED ORDER — HEPARIN SODIUM (PORCINE) 1000 UNIT/ML IJ SOLN
INTRAMUSCULAR | Status: AC
  Filled 2023-09-16: qty 10

## 2023-09-16 MED ORDER — HYDROMORPHONE HCL 1 MG/ML IJ SOLN
0.5000 mg | INTRAMUSCULAR | Status: DC | PRN
  Administered 2023-09-16 – 2023-09-17 (×9): 0.5 mg via INTRAVENOUS
  Filled 2023-09-16 (×9): qty 0.5

## 2023-09-16 MED ORDER — DEXTROSE 5 % IV SOLN
500.0000 mg | INTRAVENOUS | Status: DC
  Administered 2023-09-16 – 2023-09-17 (×2): 500 mg via INTRAVENOUS
  Filled 2023-09-16 (×2): qty 5

## 2023-09-16 MED ORDER — HEPARIN (PORCINE) IN NACL 1000-0.9 UT/500ML-% IV SOLN
INTRAVENOUS | Status: DC | PRN
  Administered 2023-09-16 (×3): 500 mL

## 2023-09-16 MED ORDER — CEFTRIAXONE SODIUM 500 MG IJ SOLR
500.0000 mg | INTRAMUSCULAR | Status: DC
  Filled 2023-09-16: qty 500

## 2023-09-16 MED ORDER — DIPHENHYDRAMINE HCL 50 MG/ML IJ SOLN
INTRAMUSCULAR | Status: AC
  Filled 2023-09-16: qty 1

## 2023-09-16 MED ORDER — HEPARIN SODIUM (PORCINE) 1000 UNIT/ML IJ SOLN
INTRAMUSCULAR | Status: DC | PRN
  Administered 2023-09-16: 5000 [IU] via INTRAVENOUS
  Administered 2023-09-16: 2000 [IU] via INTRAVENOUS

## 2023-09-16 MED ORDER — MIDAZOLAM HCL 2 MG/2ML IJ SOLN
INTRAMUSCULAR | Status: AC
  Filled 2023-09-16: qty 2

## 2023-09-16 MED ORDER — SODIUM CHLORIDE 0.9 % IV SOLN
INTRAVENOUS | Status: DC
Start: 2023-09-16 — End: 2023-09-17

## 2023-09-16 MED ORDER — IODIXANOL 320 MG/ML IV SOLN
INTRAVENOUS | Status: DC | PRN
  Administered 2023-09-16: 125 mL

## 2023-09-16 MED ORDER — CLONAZEPAM 0.5 MG PO TABS
0.5000 mg | ORAL_TABLET | Freq: Two times a day (BID) | ORAL | Status: DC | PRN
  Administered 2023-09-16 – 2023-09-17 (×3): 0.5 mg via ORAL
  Filled 2023-09-16 (×3): qty 1

## 2023-09-16 MED ORDER — HEPARIN (PORCINE) 25000 UT/250ML-% IV SOLN
1500.0000 [IU]/h | INTRAVENOUS | Status: DC
  Administered 2023-09-16: 1300 [IU]/h via INTRAVENOUS
  Administered 2023-09-17: 1500 [IU]/h via INTRAVENOUS
  Filled 2023-09-16 (×2): qty 250

## 2023-09-16 MED ORDER — LIDOCAINE HCL (PF) 1 % IJ SOLN
INTRAMUSCULAR | Status: DC | PRN
  Administered 2023-09-16: 5 mL
  Administered 2023-09-16: 15 mL

## 2023-09-16 MED ORDER — SODIUM CHLORIDE 0.9 % IV SOLN
1.0000 g | INTRAVENOUS | Status: DC
  Administered 2023-09-16 – 2023-09-17 (×2): 1 g via INTRAVENOUS
  Filled 2023-09-16 (×2): qty 10

## 2023-09-16 MED ORDER — SODIUM CHLORIDE 0.9 % WEIGHT BASED INFUSION
1.0000 mL/kg/h | INTRAVENOUS | Status: AC
  Administered 2023-09-16: 1 mL/kg/h via INTRAVENOUS

## 2023-09-16 MED ORDER — PROCHLORPERAZINE EDISYLATE 10 MG/2ML IJ SOLN
10.0000 mg | Freq: Four times a day (QID) | INTRAMUSCULAR | Status: DC | PRN
  Administered 2023-09-16: 10 mg via INTRAVENOUS
  Filled 2023-09-16: qty 2

## 2023-09-16 MED ORDER — DIPHENHYDRAMINE HCL 50 MG/ML IJ SOLN
25.0000 mg | Freq: Once | INTRAMUSCULAR | Status: DC
  Filled 2023-09-16: qty 1

## 2023-09-16 MED ORDER — GLUCERNA SHAKE PO LIQD
237.0000 mL | Freq: Three times a day (TID) | ORAL | Status: DC
Start: 2023-09-16 — End: 2023-09-17
  Administered 2023-09-17: 237 mL via ORAL

## 2023-09-16 MED ORDER — SODIUM CHLORIDE 0.9 % IV SOLN: 250.0000 mL | INTRAVENOUS | Status: DC | PRN

## 2023-09-16 MED ORDER — FENTANYL CITRATE (PF) 100 MCG/2ML IJ SOLN
INTRAMUSCULAR | Status: DC | PRN
  Administered 2023-09-16 (×2): 50 ug via INTRAVENOUS

## 2023-09-16 MED ORDER — MELATONIN 5 MG PO TABS
5.0000 mg | ORAL_TABLET | Freq: Every evening | ORAL | Status: DC | PRN
  Administered 2023-09-17: 5 mg via ORAL
  Filled 2023-09-16: qty 1

## 2023-09-16 MED ORDER — ONDANSETRON HCL 4 MG/2ML IJ SOLN
4.0000 mg | Freq: Four times a day (QID) | INTRAMUSCULAR | Status: DC | PRN
  Administered 2023-09-16: 4 mg via INTRAVENOUS
  Filled 2023-09-16: qty 2

## 2023-09-16 MED ORDER — INSULIN ASPART 100 UNIT/ML IJ SOLN
0.0000 [IU] | Freq: Every day | INTRAMUSCULAR | Status: DC
  Administered 2023-09-16: 5 [IU] via SUBCUTANEOUS

## 2023-09-16 MED ORDER — FENTANYL CITRATE (PF) 100 MCG/2ML IJ SOLN
INTRAMUSCULAR | Status: AC
  Filled 2023-09-16: qty 2

## 2023-09-16 SURGICAL SUPPLY — 13 items
CANISTER PENUMBRA ENGINE (MISCELLANEOUS) IMPLANT
CATH ANGIO 5F BER 100CM (CATHETERS) IMPLANT
CATH LIGHTNI FLASH 16XTORQ 100 (CATHETERS) IMPLANT
CATH OMNI FLUSH 5F 65CM (CATHETERS) IMPLANT
COVER DOME SNAP 22 D (MISCELLANEOUS) IMPLANT
GLIDEWIRE ADV .035X260CM (WIRE) IMPLANT
GLIDEWIRE ANGLED SS 035X260CM (WIRE) IMPLANT
KIT MICROPUNCTURE NIT STIFF (SHEATH) IMPLANT
SET CLOVERSNARE FLT RETRIEVAL (MISCELLANEOUS) IMPLANT
SHEATH DRYSEAL FLEX 16FR 33CM (SHEATH) IMPLANT
SHEATH PINNACLE 8F 10CM (SHEATH) IMPLANT
SHEATH PROBE COVER 6X72 (BAG) IMPLANT
TRAY PV CATH (CUSTOM PROCEDURE TRAY) IMPLANT

## 2023-09-16 NOTE — ED Notes (Signed)
Assisted pt to restroom  

## 2023-09-16 NOTE — Progress Notes (Signed)
PROGRESS NOTE  Jaime [name]  GNF:[id number] DOB: [birth year] DOA: 09/15/2023 PCP: Rica Records, FNP   Brief Narrative: Patient is a 37 year old female with history of diabetes type 2, GERD, tobacco use, DVT who presented to the emergency department at [facility] with complaint of 2 to 3 days onset of left leg pain, shortness of breath.  She was recently diagnosed with pneumonia and treated with antibiotics.  She was recently admitted to [facility] on October 24 211/1 due to acute on chronic DVT of left lower extremity, she was on Lovenox.  IR was consulted and she underwent IVC filter placement on 10/26. On presentation, she was hemodynamically stable.  Lab work showed D-dimer of 19.1.  CT venogram of abdomen and pelvis was positive for occlusive DVT within the left lower extremity, extension of the occlusive thrombus extending all the left external iliac and common iliac veins, thrombus within the IVC filter.  CT angiogram of the chest showed suboptimal opacification of the pulmonary system, no definitive acute PE to the segmental level.  Chest x-ray did not show any active disease.  Vascular surgery consulted and patient was transferred to [facility] for possible surgical intervention for recurrent left lower extremity DVT.  Plan for mechanical thrombectomy today.  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  Elevated D-dimer.  Recurrent left lower extremity DVT.  Has IVC filter thrombus.  Currently on heparin  drip.  Vascular surgery consulted and following.  Has elevated D-dimer.  CT angiogram did not show any acute PE. Vascular surgery planning for mechanical thrombectomy and possible removal of the filter.  Currently on heparin drip.  Dyspnea/suspected, community acquired pneumonia: Recently treated for pneumonia.  CTA showed a small left-sided pleural effusion with consolidative space disease in the left lower lobe which could be either atelectasis or pneumonia.  Started on ceftriaxone azithromycin.  Follow-up urine Legionella, strep pneumoniae, procalcitonin.  Continue Mucinex, incentive spirometer, flutter valve.  She remains on room air.  Denies any shortness of breath or cough.  Acute on chronic diastolic congestive heart failure: Takes torsemide at home.  Currently overall euvolemic.   Cirrhosis/NASH/hepatitis C: She says she has been treated for hepatitis C. 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   Diabetes type 2: On glipizide, metformin, insulin at home. Continue long-acting, sliding scale insulin here.  Recent A1c of 9.4 as per 07/25/23.   Thrombocytopenia: Likely associated with chronic liver disease in the setting of cirrhosis.  She follows with hematology as an outpatient.  GERD: Continue Protonix  Tobacco use: Counseled for cessation.  Continue nicotine patch   Morbid obesity: BMI of 35         DVT prophylaxis:SCDs Start: 09/16/23 0235     Code Status: Full Code  Family Communication: None at the bedside  Patient status:Inpatient  Patient is from :home  Anticipated discharge QI:ONGE  Estimated DC date: After vascular surgery clearance   Consultants: Vascular surgery  Procedures:None yet  Antimicrobials:  Anti-infectives (From admission, onward)    Start  Dose/Rate Route Frequency Ordered Stop   09/16/23 0400  cefTRIAXone (ROCEPHIN) injection 500 mg  Status:  Discontinued        500 mg Intramuscular Every 24 hours 09/16/23 0310 09/16/23 0312    09/16/23 0400  azithromycin (ZITHROMAX) 500 mg in dextrose 5 % 250 mL IVPB        500 mg 250 mL/hr over 60 Minutes Intravenous Every 24 hours 09/16/23 0310     09/16/23 0400  cefTRIAXone (ROCEPHIN) 1 g in sodium chloride 0.9 % 100 mL IVPB        1 g 200 mL/hr over 30 Minutes Intravenous Every 24 hours 09/16/23 1610         Subjective: Patient seen and examined at bedside today.  She is hemodynamically stable lying in bed.  Overall appears comfortable.  Has some discomfort on the left lower extremity.  Left lower extremity is edematous.  Not short of breath or coughing.  Objective: Vitals:   09/15/23 2200 09/15/23 2232 09/16/23 0210 09/16/23 0550  BP: 133/79 137/82 120/81 118/77  Pulse:  97 97 94  Resp:  18  18  Temp:  98.4 F (36.9 C) 98.5 F (36.9 C) 98.3 F (36.8 C)  TempSrc:  Oral Oral Oral  SpO2:  98% 94% 93%  Weight:    110.3 kg  Height:        Intake/Output Summary (Last 24 hours) at 09/16/2023 0758 Last data filed at 09/16/2023 0321 Gross per 24 hour  Intake 480 ml  Output --  Net 480 ml   Filed Weights   09/15/23 1408 09/16/23 0550  Weight: 112.5 kg 110.3 kg    Examination:  General exam: Overall comfortable, not in distress, morbidly obese HEENT: PERRL Respiratory system:  no wheezes or crackles  Cardiovascular system: S1 & S2 heard, RRR.  Gastrointestinal system: Abdomen is nondistended, soft and nontender. Central nervous system: Alert and oriented Extremities: No edema, no clubbing ,no cyanosis, edema of left lower extremity Skin: No rashes, no ulcers,no icterus     Data Reviewed: I have personally reviewed following labs and imaging studies  CBC: Recent Labs  Lab 09/13/23 1425 09/14/23 1703 09/15/23 1747  WBC 8.3 7.5 8.2  NEUTROABS 6.5 5.9 6.7  HGB 13.1 12.6 14.2  HCT 42.1 40.2 44.4  MCV 90.5 91.4 91.4  PLT 114* 102* 115*   Basic Metabolic Panel: Recent Labs  Lab 09/13/23 1425 09/14/23 1703 09/15/23 1747  NA 136 132* 136  K 3.4*  3.9 4.0  CL 102 102 100  CO2 26 22 24   GLUCOSE 323* 486* 382*  BUN 7 5* 6  CREATININE 0.54 0.59 0.59  CALCIUM 8.2* 8.1* 8.5*     No results found for this or any previous visit (from the past 240 hour(s)).   Radiology Studies: CT VENOGRAM ABD/PEL  Result Date: 09/15/2023 CLINICAL DATA:  DVT EXAM: CT VENOGRAM ABDOMEN AND PELVIS TECHNIQUE: Venographic phase images of the abdomen, pelvis and lower extremities were obtained following the administration of intravenous contrast. Multiplanar reformats and maximum intensity projections were generated. RADIATION DOSE REDUCTION: This exam was performed according to the departmental dose-optimization program which includes automated exposure control, adjustment of the mA and/or kV according to patient size and/or use of iterative reconstruction technique. CONTRAST:  OMNIPAQUE IOHEXOL 350 MG/ML SOLN COMPARISON:  Ultrasound 09/13/2023, CT 01/07/2023, 11/08/2021 FINDINGS: Lower chest: See separately dictated chest CT. Small left-sided pleural effusion with consolidation at the left lung base. Hepatobiliary: Nodular hepatic contour suspicious for cirrhosis. Cholecystectomy.  No biliary dilatation. Pancreas: Unremarkable. No pancreatic ductal dilatation or surrounding inflammatory changes. Spleen: Massively enlarged, craniocaudal measurement of 26 cm, previously 26 cm. Interval embolization coils at the splenic hilus. Heterogenous splenic enhancement with multiple geographic areas of non enhancement presumably due to infarcts. No evidence for hematoma. Extensive perisplenic collateral vessels. Adrenals/Urinary Tract: Adrenal glands are normal. Kidneys show no hydronephrosis. The bladder is decompressed Stomach/Bowel: The stomach is nonenlarged. There is no dilated small bowel. No acute bowel wall thickening. Vascular/Lymphatic: Nonaneurysmal aorta. No suspicious lymph nodes. Small retroperitoneal lymph nodes. Reproductive: Uterus and bilateral adnexa are  unremarkable. Other: No free air. Moderate volume of ascites. Subcutaneous edema within the anterior pannus Musculoskeletal: No acute osseous abnormality IVC: IVC filter with apex at the L3 level. Thrombus within the IVC filter. Extension of thrombus above the filter apex approximately 2.6 cm cranial caudal, extends to just below the right renal vein origin. Portal and mesenteric veins: Small volume thrombus in the portal vein, series 5, image 34. question tiny amount of thrombus within the SMV, series 5, image 45. Bilateral iliac veins: Right iliac vein is patent. Positive for occlusive thrombus within the left common iliac vein and external iliac vein. Right lower extremity: No thrombus within the imaged portions of the right common femoral, deep femoral or femoral veins. Left lower extremity: Positive for occlusive thrombus within the left common femoral and imaged portions of the proximal left femoral vein. Small volume thrombus within the proximal deep femoral vein. IMPRESSION: 1. Positive for occlusive DVT within the left lower extremity as seen on prior ultrasound imaging. There is extension of occlusive thrombus to involve the left external iliac and common iliac veins. The distal IVC is patent. 2. IVC filter is present. Positive for thrombus within the IVC filter with extension of thrombus about 2.6 cm above the filter apex extending to just below the right renal vein origin. 3. Small volume thrombus in the portal vein. Question tiny amount of thrombus within the SMV. 4. Massively enlarged spleen with interval embolization coils at the splenic hilus. Heterogenous splenic enhancement with multiple geographic areas of non enhancement presumably due to infarcts. No evidence for splenic hematoma. 5. Cirrhosis with evidence for portal hypertension and moderate volume of ascites. 6. Small left-sided pleural effusion with consolidation at the left lung base. Electronically Signed   By: Jasmine Pang M.D.   On:  09/15/2023 23:12   CT Angio Chest PE W and/or Wo Contrast  Result Date: 09/15/2023 CLINICAL DATA:  Shortness of breath EXAM: CT ANGIOGRAPHY CHEST WITH CONTRAST TECHNIQUE: Multidetector CT imaging of the chest was performed using the standard protocol during bolus administration of intravenous contrast. Multiplanar CT image reconstructions and MIPs were obtained to evaluate the vascular anatomy. RADIATION DOSE REDUCTION: This exam was performed according to the departmental dose-optimization program which includes automated exposure control, adjustment of the mA and/or kV according to patient size and/or use of iterative reconstruction technique. CONTRAST:  OMNIPAQUE IOHEXOL 350 MG/ML SOLN COMPARISON:  Chest x-ray 09/14/2023, CT chest 07/29/2020 FINDINGS: Cardiovascular: Suboptimal opacification of the pulmonary arterial system. No definite acute pulmonary embolus to the segmental level. Nonaneurysmal aorta. Cardiac size within normal limits. No pericardial effusion Mediastinum/Nodes: No enlarged mediastinal, hilar, or axillary lymph nodes. Thyroid gland, trachea, and esophagus demonstrate no significant findings. Lungs/Pleura: Small left-sided pleural effusion. Consolidative airspace disease in the left lower lobe. Small ground-glass focus at the right apex. Upper Abdomen: See separately dictated CT Musculoskeletal: No acute osseous abnormality Review of the MIP images  confirms the above findings. IMPRESSION: 1. Suboptimal opacification of the pulmonary arterial system. No definite acute pulmonary embolus to the segmental level. 2. Small left-sided pleural effusion with consolidative airspace disease in the left lower lobe, either representing atelectasis or pneumonia. Small ground-glass focus at the right apex, suspect infectious in etiology. Electronically Signed   By: Jasmine Pang M.D.   On: 09/15/2023 22:52   DG Chest 2 View  Result Date: 09/14/2023 CLINICAL DATA:  SOB EXAM: CHEST - 2 VIEW  COMPARISON:  X-ray 01/16/2023, CT chest 07/29/2020 FINDINGS: The heart and mediastinal contours are within normal limits. Bibasilar atelectasis. No focal consolidation. No pulmonary edema. No pleural effusion. No pneumothorax. No acute osseous abnormality. IMPRESSION: No active cardiopulmonary disease. Electronically Signed   By: Tish Frederickson M.D.   On: 09/14/2023 19:52    Scheduled Meds:  dextromethorphan-guaiFENesin  1 tablet Oral BID   feeding supplement (GLUCERNA SHAKE)  237 mL Oral TID BM   insulin aspart  0-15 Units Subcutaneous TID WC   insulin aspart  0-5 Units Subcutaneous QHS   insulin glargine-yfgn  15 Units Subcutaneous QHS   nicotine  21 mg Transdermal Daily   pantoprazole  40 mg Oral Daily   Continuous Infusions:  azithromycin 500 mg (09/16/23 0527)   cefTRIAXone (ROCEPHIN)  IV 1 g (09/16/23 0417)     LOS: 1 day   Burnadette Pop, MD [facility] P11/14/2024, 7:58 AM

## 2023-09-16 NOTE — Evaluation (Signed)
Physical Therapy Evaluation Patient Details Name: Jaime [name] MRN: [id number] DOB: [birth year] Today's Date: 09/16/2023  History of Present Illness  Jaime [name] is a 37 y.o. female who presented to the [facility] emergency department  with worsening pain and swelling in her left leg.  The patient [facility] for a left leg DVT.  This was acute on chronic as she had had a DVT back in March.  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  She also underwent splenic embolization.   She does have a IVC filter in place that was inserted 3 weeks ago.  While at [facility], a CT venogram revealed thrombus within and above her IVC filter as well as what appears to be acute thrombus within her left iliac system as well as the femoral-popliteal veins.  Transfer to [facility] 11/13 and thrombectomy and possible IVC filter removal planned for 11/14.  PMH: smoker, DM, COPD, lupus, hepatitis C, adjustment disorder  Clinical Impression  Pt admitted with above diagnosis. Pt was able to ambulate in room limited due to planned surgery for DVT left LE today.  Pt should progress well after surgery and not have PT needs however will follow and assess after surgery is completed.   Pt currently with functional limitations due to the deficits listed below (see PT Problem List). Pt will benefit from acute skilled PT to increase their independence and safety with mobility to allow discharge.           If plan is discharge home, recommend the following: Assist for transportation;Assistance with cooking/housework   Can travel by private vehicle        Equipment Recommendations Other (comment) (TBA may need RW)  Recommendations for Other Services       Functional Status Assessment Patient has had a recent decline in their functional status and demonstrates the ability to make significant improvements in function in a  reasonable and predictable amount of time.     Precautions / Restrictions Precautions Precautions: None Restrictions Weight Bearing Restrictions: No      Mobility  Bed Mobility Overal bed mobility: Independent                  Transfers Overall transfer level: Independent                      Ambulation/Gait Ambulation/Gait assistance: Supervision Gait Distance (Feet): 15 Feet Assistive device: None Gait Pattern/deviations: Step-through pattern, Decreased stride length   Gait velocity interpretation: 1.31 - 2.62 ft/sec, indicative of limited community ambulator   General Gait Details: Nurse present and due to pending surgery, pt is bathroom priveleges only. Pt was sitting on couch in room, therefore had pt walk to bed and her balance was good with this short distance ambulation.  No apparent deficits.  Stairs            Wheelchair Mobility     Tilt Bed    Modified Rankin (Stroke Patients Only)       Balance Overall balance assessment: Needs assistance Sitting-balance support: No upper extremity supported, Feet supported Sitting balance-Leahy Scale: Fair     Standing balance support: No upper extremity supported, During functional activity Standing balance-Leahy Scale: Fair  Pertinent Vitals/Pain Pain Assessment Pain Assessment: No/denies pain    Home Living Family/patient expects to be discharged to:: Private residence Living Arrangements: Non-relatives/Friends;Other relatives Available Help at Discharge: Family;Available 24 hours/day Type of Home: House Home Access: Stairs to enter Entrance Stairs-Rails: Right;Left;Can reach both Entrance Stairs-Number of Steps: 5   Home Layout: One level Home Equipment: Tub bench;BSC/3in1      Prior Function Prior Level of Function : Driving;Independent/Modified Independent (disabled)             Mobility Comments: No device needed ADLs  Comments: I ADLs     Extremity/Trunk Assessment   Upper Extremity Assessment Upper Extremity Assessment: Defer to OT evaluation    Lower Extremity Assessment Lower Extremity Assessment: LLE deficits/detail;RLE deficits/detail RLE Sensation: history of peripheral neuropathy;decreased light touch LLE Deficits / Details: grossly 3/5 LLE Sensation: decreased light touch;history of peripheral neuropathy    Cervical / Trunk Assessment Cervical / Trunk Assessment: Normal  Communication   Communication Communication: No apparent difficulties  Cognition Arousal: Alert Behavior During Therapy: WFL for tasks assessed/performed Overall Cognitive Status: Within Functional Limits for tasks assessed                                          General Comments General comments (skin integrity, edema, etc.): VSS    Exercises     Assessment/Plan    PT Assessment Patient needs continued PT services  PT Problem List Decreased activity tolerance;Decreased balance;Decreased mobility;Decreased knowledge of use of DME;Decreased safety awareness;Decreased knowledge of precautions       PT Treatment Interventions DME instruction;Gait training;Stair training;Therapeutic activities;Patient/family education;Therapeutic exercise    PT Goals (Current goals can be found in the Care Plan section)  Acute Rehab PT Goals Patient Stated Goal: to get better PT Goal Formulation: With patient Time For Goal Achievement: 09/30/23 Potential to Achieve Goals: Good    Frequency Min 1X/week     Co-evaluation               AM-PAC PT "6 Clicks" Mobility  Outcome Measure                  End of Session Equipment Utilized During Treatment: Gait belt Activity Tolerance: Patient tolerated treatment well Patient left: in bed;with call bell/phone within reach Nurse Communication: Mobility status PT Visit Diagnosis: Muscle weakness (generalized) (M62.81)    Time: 1610-9604 PT  Time Calculation (min) (ACUTE ONLY): 12 min   Charges:   PT Evaluation $PT Eval Low Complexity: 1 Low   PT General Charges $$ ACUTE PT VISIT: 1 Visit         Sagrario Lineberry M,PT Acute Rehab Services [phone]   Bevelyn Buckles 09/16/2023, 12:09 PM

## 2023-09-16 NOTE — Progress Notes (Signed)
PHARMACY - ANTICOAGULATION CONSULT NOTE  Pharmacy Consult for Heparin Indication: LLE DVT  Allergies  Allergen Reactions   Iodine-131 Anaphylaxis, Shortness Of Breath and Swelling   Ivp Dye [Iodinated Contrast Media] Anaphylaxis    Skin gets very red, unable to walk    Ketorolac Tromethamine Shortness Of Breath   Tylenol [Acetaminophen] Other (See Comments)    Due to cirrhosis    Vancomycin Swelling    Facial swelling,    Gabapentin Itching   Nsaids Other (See Comments)    Flares ulcers   Suboxone [Buprenorphine Hcl-Naloxone Hcl]     Withdrawal symptoms     Patient Measurements: Height: 5\' 9"  (175.3 cm) Weight: 110.3 kg (243 lb 2.7 oz) IBW/kg (Calculated) : 66.2 Heparin Dosing Weight: 93 kg  Vital Signs: Temp: 98.3 F (36.8 C) (11/14 1751) Temp Source: Oral (11/14 1751) BP: 110/72 (11/14 1751) Pulse Rate: 96 (11/14 1751)  Labs: Recent Labs    09/14/23 1703 09/15/23 1747 09/15/23 2312 09/16/23 0950  HGB 12.6 14.2  --  12.7  HCT 40.2 44.4  --  39.7  PLT 102* 115*  --  144*  APTT  --   --  28  --   LABPROT  --   --  14.7  --   INR  --   --  1.1  --   CREATININE 0.59 0.59  --  0.71  TROPONINIHS  --  3  --   --     Estimated Creatinine Clearance: 124.9 mL/min (by C-G formula based on SCr of 0.71 mg/dL).   Assessment: 37 year old female with history of multiple DVTs in the past to begin heparin for new DVT and thrombus with plans for thrombectomy.  On Lovenox PTA, none here since admission 11/13. History of hepatitis C with thrombocytopenia - Plts 115 at baseline  S/p thrombectomy 11/14 - subacute clot removed. Chronic DVT remains. To restart heparin post procedure.  Heparin was stopped in lab at 1450. 7000 units heparin boluses given in lab - last ~1700.   Goal of Therapy:  Heparin level 0.3-0.7 units/ml Monitor platelets by anticoagulation protocol: Yes   Plan:  Restart heparin 1300 units/hr Will f/u 6 hr heparin level post restart  Christoper Fabian,  PharmD, BCPS Please see amion for complete clinical pharmacist phone list 09/16/2023,5:52 PM

## 2023-09-16 NOTE — Consult Note (Signed)
[facility] [place]  Patient name: Jaime [name] MRN: [id number] DOB: [birth year] Sex: female   REQUESTING PROVIDER:   Hospital service   REASON FOR CONSULT:    DVT  HISTORY OF PRESENT ILLNESS:   Jaime [name] is a 37 y.o. female, who presented to the [facility] emergency department yesterday with worsening pain and swelling in her left leg.  [facility] for a left leg D[facility].  This was acute on chronic as she had had a DVT back in March.  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  She also underwent splenic embolization.  Her platelet count has improved.  Her pain and swelling has gotten worse.  She does have a IVC filter in place that was inserted 3 weeks ago.  While at [facility], a CT venogram revealed thrombus within and above her IVC filter as well as what appears to be acute thrombus within her left iliac system as well as the femoral-popliteal veins.  The patient suffers from cirrhosis secondary to hepatitis C.  She is a type II diabetic.  She has COPD as well as lupus.  She is a former smoker.  PAST MEDICAL HISTORY    Past Medical History:  Diagnosis Date   Acute encephalopathy 08/31/2020   Adjustment disorder with depressed mood 09/07/2020   Anxiety    Asthma    Chronic abdominal pain    Chronic back pain    Chronic prescription opiate use 02/16/2022   Cirrhosis (HCC)    Cirrhosis (HCC)    COPD (chronic obstructive pulmonary disease) (HCC)    Depression    Diabetes mellitus without complication (HCC)    Diabetes mellitus, type II (HCC)    Diverticulosis    DVT (deep venous thrombosis) (HCC) 02/04/2023   taking lovenox prior to procedure   Fatty liver 03/07/2020   GERD (gastroesophageal reflux disease)    Hepatitis C    Hernia, abdominal    Hyperglycemia due to type 2 diabetes mellitus  (HCC) 05/24/2020   Hyperlipidemia    IBS (irritable bowel syndrome) 02/16/2022   Insomnia    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)    Lupus    Migraine headache    Morbid obesity (HCC) 05/24/2020   Neuropathy    Nocturnal seizures (HCC)    Pain of upper abdomen 07/28/2021   Peptic ulcer    Spleen enlarged    Splenomegaly      FAMILY HISTORY   Family History  Problem Relation Age of Onset   Hyperlipidemia Maternal Grandfather     SOCIAL HISTORY:   Social History   Socioeconomic History   Marital status: Married    Spouse name: Not on file   Number of children: Not on file   Years of education: Not on file   Highest education level: Not on file  Occupational History   Not on file  Tobacco Use   Smoking status: Every Day    Current packs/day: 0.50    Types: Cigarettes    Passive exposure: Current   Smokeless tobacco: Never  Vaping Use   Vaping status: Former   Substances: Nicotine, Flavoring  Substance and Sexual Activity   Alcohol use: No   Drug use: No   Sexual activity: Not Currently    Birth control/protection: Implant  Other Topics Concern   Not on file  Social History Narrative   Not on file   Social Determinants of Health   Financial Resource Strain: Medium Risk (05/09/2020)   Overall Financial Resource Strain (CARDIA)    Difficulty of Paying Living Expenses: Somewhat hard  Food Insecurity: No Food Insecurity (09/16/2023)   Hunger Vital Sign    Worried About Running Out of Food in the Last Year: Never true    Ran Out of Food in the Last Year: Never true  Transportation Needs: No Transportation Needs (09/16/2023)   PRAPARE - Administrator, Civil Service (Medical): No    Lack of Transportation (Non-Medical): No  Physical Activity: Insufficiently Active (05/09/2020)   Exercise Vital Sign    Days of Exercise per Week: 1 day    Minutes of Exercise per Session: 20 min  Stress: Stress Concern Present (05/09/2020)    [employer] of Occupational Health - Occupational Stress Questionnaire    Feeling of Stress : Very much  Social Connections: Unknown (03/04/2022)   Received from [facility]   Social Network    Social Network: Not on file  Intimate Partner Violence: Not At Risk (09/16/2023)   Humiliation, Afraid, Rape, and Kick questionnaire    Fear of Current or Ex-Partner: No    Emotionally Abused: No    Physically Abused: No    Sexually Abused: No    ALLERGIES:    Allergies  Allergen Reactions   Iodine-131 Anaphylaxis, Shortness Of Breath and Swelling   Ivp Dye [Iodinated Contrast Media] Anaphylaxis    Skin gets very red, unable to walk    Ketorolac Tromethamine Shortness Of Breath   Tylenol [Acetaminophen] Other (See Comments)    Due to cirrhosis    Vancomycin Swelling    Facial swelling,    Gabapentin Itching   Nsaids Other (See Comments)    Flares ulcers   Suboxone [Buprenorphine Hcl-Naloxone Hcl]     Withdrawal symptoms     CURRENT MEDICATIONS:    Current Facility-Administered Medications  Medication Dose Route Frequency Provider Last Rate Last Admin   azithromycin (ZITHROMAX) 500 mg in dextrose 5 % 250 mL IVPB  500 mg Intravenous Q24H Adefeso, Oladapo, DO 250 mL/hr at 09/16/23 0527 500 mg at 09/16/23 0527   cefTRIAXone (ROCEPHIN) 1 g in sodium chloride 0.9 % 100 mL IVPB  1 g Intravenous Q24H Adefeso, Oladapo, DO 200 mL/hr at 09/16/23 0417 1 g at 09/16/23 0417   dextromethorphan-guaiFENesin (MUCINEX DM) 30-600 MG per 12 hr tablet 1 tablet  1 tablet Oral BID Adefeso, Oladapo, DO   1 tablet at 09/16/23 0411   feeding supplement (GLUCERNA SHAKE) (GLUCERNA SHAKE) liquid 237 mL  237 mL Oral TID BM Adefeso, Oladapo, DO       HYDROmorphone (DILAUDID) injection 0.5 mg  0.5 mg Intravenous Q3H PRN Adefeso, Oladapo, DO   0.5 mg at 09/16/23 0802   insulin aspart (novoLOG) injection 0-15 Units  0-15 Units Subcutaneous TID WC Adefeso, Oladapo, DO       insulin aspart  (novoLOG) injection 0-5 Units  0-5 Units Subcutaneous QHS Adefeso, Oladapo, DO       insulin glargine-yfgn (SEMGLEE) injection 15 Units  15 Units Subcutaneous QHS Adefeso, Oladapo, DO       nicotine (NICODERM CQ - dosed in mg/24 hours) patch 21 mg  21 mg Transdermal Daily Adefeso, Oladapo, DO   21 mg at 09/16/23 0411   ondansetron (ZOFRAN) tablet 4 mg  4 mg Oral Q6H PRN Frankey Shown, DO  Or   ondansetron (ZOFRAN) injection 4 mg  4 mg Intravenous Q6H PRN Adefeso, Oladapo, DO   4 mg at 09/16/23 0801   pantoprazole (PROTONIX) EC tablet 40 mg  40 mg Oral Daily Adefeso, Oladapo, DO   40 mg at 09/16/23 0804    REVIEW OF SYSTEMS:   [X]  denotes positive finding, [ ]  denotes negative finding Cardiac  Comments:  Chest pain or chest pressure:    Shortness of breath upon exertion:    Short of breath when lying flat:    Irregular heart rhythm:        Vascular    Pain in calf, thigh, or hip brought on by ambulation:    Pain in feet at night that wakes you up from your sleep:     Blood clot in your veins:    Leg swelling:  x       Pulmonary    Oxygen at home:    Productive cough:     Wheezing:         Neurologic    Sudden weakness in arms or legs:     Sudden numbness in arms or legs:     Sudden onset of difficulty speaking or slurred speech:    Temporary loss of vision in one eye:     Problems with dizziness:         Gastrointestinal    Blood in stool:      Vomited blood:         Genitourinary    Burning when urinating:     Blood in urine:        Psychiatric    Major depression:         Hematologic    Bleeding problems:    Problems with blood clotting too easily:        Skin    Rashes or ulcers:        Constitutional    Fever or chills:     PHYSICAL EXAM:   Vitals:   09/15/23 2200 09/15/23 2232 09/16/23 0210 09/16/23 0550  BP: 133/79 137/82 120/81 118/77  Pulse:  97 97 94  Resp:  18  18  Temp:  98.4 F (36.9 C) 98.5 F (36.9 C) 98.3 F (36.8 C)  TempSrc:   Oral Oral Oral  SpO2:  98% 94% 93%  Weight:    110.3 kg  Height:        GENERAL: The patient is a well-nourished female, in no acute distress. The vital signs are documented above. CARDIAC: There is a regular rate and rhythm.  VASCULAR: Significant left leg edema PULMONARY: Nonlabored respirations ABDOMEN: Soft and non-tender with normal pitched bowel sounds.  MUSCULOSKELETAL: There are no major deformities or cyanosis. NEUROLOGIC: No focal weakness or paresthesias are detected. SKIN: There are no ulcers or rashes noted. PSYCHIATRIC: The patient has a normal affect.  STUDIES:   I have reviewed her CT venogram with the following findings: 1. Positive for occlusive DVT within the left lower extremity as seen on prior ultrasound imaging. There is extension of occlusive thrombus to involve the left external iliac and common iliac veins. The distal IVC is patent. 2. IVC filter is present. Positive for thrombus within the IVC filter with extension of thrombus about 2.6 cm above the filter apex extending to just below the right renal vein origin. 3. Small volume thrombus in the portal vein. Question tiny amount of thrombus within the SMV. 4. Massively enlarged spleen with interval embolization coils at  the splenic hilus. Heterogenous splenic enhancement with multiple geographic areas of non enhancement presumably due to infarcts. No evidence for splenic hematoma. 5. Cirrhosis with evidence for portal hypertension and moderate volume of ascites. 6. Small left-sided pleural effusion with consolidation at the left lung base.   PE CT scan: No obvious evidence of PE  ASSESSMENT and PLAN   Left leg DVT: The patient was not a candidate for intervention 3 weeks ago and so she had a IVC filter placed.  She has developed worsening and progressive left leg swelling and pain.  Imaging revealed thrombus within her IVC filter which does extend above it.  There appears to be acute iliac vein  thrombus.  We discussed proceeding with mechanical thrombectomy.  This will likely be through a right internal jugular vein approach.  I talked about possibly removing her filter.  We will also try to remove thrombus from her iliac system and is much as the femoral-popliteal vein as we will reach with the catheter.  The patient is in agreement and wants to proceed.   Charlena Cross, MD, FACS [facility] [phone] Pager [phone]

## 2023-09-16 NOTE — Op Note (Addendum)
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 female, who presented to the [facility] emergency department yesterday with worsening pain and swelling in her left leg.  [facility] for a left leg DVT.  This was acute on chronic as she had had a DVT back in March.  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  She also underwent splenic embolization.  Her platelet count has improved.  Her pain and swelling has gotten worse.  She does have a IVC filter in place that was inserted 3 weeks ago.  While at [facility], a CT venogram revealed thrombus within and above her IVC filter as well as what appears to be acute thrombus within her left iliac system as well as the femoral-popliteal veins.    Findings: Subacute clot removed from IVC, left common, external iliac and common femoral veins.  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   Procedure:  The patient was identified in the holding area and taken to the cath lab  The patient was then placed supine on the table and prepped and draped in the usual sterile fashion.  A time out was called.  Ultrasound was used to evaluate the right internal jugular vein which was patent.  Under ultrasound guidance the  area was anesthetized with lidocaine and the micropuncture access needle was used to access the right IJ.  The 018 wire was placed through this and into the right atrium.  The micro sheath was then placed over this.  A glide advantage wire was used to navigate through the right atrium and into the IVC.  The micro sheath was then exchanged for an 8 [place] sheath.  An ACT was then drawn and she was on a heparin drip which resulted to be subtherapeutic so an 8000 unit bolus of heparin was given.  I then attempted multiple times to cannulate the left femoral vein from this IJ access site although since there was a patent internal iliac vein and pelvis circulation I cannot get the wire to cross into the external iliac vein.  The glide advantage wire was placed down to the left internal iliac vein and the 8 French sheath was exchanged for the 16 French dry seal sheath.  A cavogram was obtained which demonstrated on the top of the IVC filter.  The 16 French flash penumbra catheter was used to thrombectomized this area of the IVC filter with 2 passes.  Cavogram demonstrated minimal residual clot at the filter.  The left common femoral vein was then accessed under ultrasound guidance with the micropuncture access needle.  The 018 wire was advanced and the micro sheath was placed over this wire.  An 035 stiff angled glide was then passed through which easily tracked through the left external iliac and common iliac veins and into the IVC.  Using this as a marker I was able to cannulate the left external iliac vein with the glide advantage wire and bear catheter from the right IJ access and a wire was passed down into the left superficial femoral vein.  Venogram demonstrated filling defects with extensive collateralization to the pelvic system.  I then made multiple passes with a 16 French penumbra clearing the common iliac and external iliac veins.  Attention was then turned to the common femoral, superficial and deep femoral  veins. The wire was passed into the superficial femoral vein but there was a significant stenosis or occlusion in the 16 French penumbra was unable to pass further than the proximal portion of the superficial femoral vein.  There was also chronic thrombus occluding the deep femoral vein and I was unable to get a wire to pass.  The 16 French penumbra was able to engage although did not result in thrombus extrusion.  Venogram demonstrated flow through the main external iliac vein with less filling of the collaterals.  My partner Dr. Chestine Spore came in and also assessed the images and we agreed that since the wire would not pass into the deep femoral vein that this was likely a chronic occlusion.  Cavogram then demonstrated more thrombus in the filter so the 16 French penumbra was again passed through the dry seal sheath and 2 more passes were made with good resolution and minimal residual thrombus.  I then attempted to remove the filter with a Cook IVC filter retrieval kit although was unable to snare the hook.  It appeared that the snare leaflets would bounce around the hook leading me to believe that there was still residual thrombus at the top of the filter.  For this reason I elected to leave the filter in place and plan for 3 months of anticoagulation.  The 16 French sheath was removed from the right IJ and manual pressure was held.  The micro sheath in the left common femoral vein was removed and manual pressure was held.  Both access sites were demonstrated excellent hemostasis.  Contrast: 125 cc EBL: 1000cc  Impression: Acute on chronic thrombus of the left common and external iliac veins.  Chronic occlusions of the left profunda and left superficial femoral veins. Residual thrombus on the IVC filter  Plan for 3 months of anticoagulation should her platelets remain stable and with then consider filter removal after that time.   [name] MD [facility] [place] Office:  [phone]

## 2023-09-16 NOTE — Inpatient Diabetes Management (Signed)
Inpatient Diabetes Program Recommendations  AACE/ADA: New Consensus Statement on Inpatient Glycemic Control (2015)  Target Ranges:  Prepandial:   less than 140 mg/dL      Peak postprandial:   less than 180 mg/dL (1-2 hours)      Critically ill patients:  140 - 180 mg/dL   Lab Results  Component Value Date   GLUCAP 214 (H) 09/16/2023   HGBA1C 9.4 (A) 07/26/2023    Review of Glycemic Control  Diabetes history: type 2 Outpatient Diabetes medications: U-500 insulin Regular 90 units TID, Mounjaro 2.5 mg weekly Current orders for Inpatient glycemic control: Semglee 15 units at HS, Novolog 0-15 units correction scale TID, Novolog 0-5 units HS scale  Inpatient Diabetes Program Recommendations:   Spoke with patient at the bedside. She has been on U-500 insulin for about 1 year. Discussed her HgbA1C of 9.4% which she noted that it had been high for some time. Patient states that she uses Dexcom G6 CGM at home for checking blood sugars. Encouraged her to follow up with her physician for blood sugar control.   Will continue to monitor blood sugars while in the hospital.  Smith Mince RN BSN CDE Diabetes Coordinator Pager: [phone]  8am-5pm

## 2023-09-16 NOTE — Progress Notes (Signed)
Received patient into the holding area.  Patient has a signed consent and states that she does not have any questions or concerns about the procedure.  She does not have family with her at the facility, however, would like her mother, Venetia Night, contacted post procedure to provide an update.  Patient denies any pain at this time and was administered dilaudid IV at approximately 1230.

## 2023-09-16 NOTE — Progress Notes (Signed)
PHARMACY - ANTICOAGULATION CONSULT NOTE  Pharmacy Consult for Heparin Indication: Ongoing DVTs  Allergies  Allergen Reactions   Iodine-131 Anaphylaxis, Shortness Of Breath and Swelling   Ivp Dye [Iodinated Contrast Media] Anaphylaxis    Skin gets very red, unable to walk    Ketorolac Tromethamine Shortness Of Breath   Tylenol [Acetaminophen] Other (See Comments)    Due to cirrhosis    Vancomycin Swelling    Facial swelling,    Gabapentin Itching   Nsaids Other (See Comments)    Flares ulcers   Suboxone [Buprenorphine Hcl-Naloxone Hcl]     Withdrawal symptoms     Patient Measurements: Height: 5\' 9"  (175.3 cm) Weight: 110.3 kg (243 lb 2.7 oz) IBW/kg (Calculated) : 66.2 Heparin Dosing Weight: 93 kg  Vital Signs: Temp: 98.3 F (36.8 C) (11/14 0550) Temp Source: Oral (11/14 0550) BP: 118/77 (11/14 0550) Pulse Rate: 94 (11/14 0550)  Labs: Recent Labs    09/13/23 1425 09/14/23 1703 09/15/23 1747 09/15/23 2312  HGB 13.1 12.6 14.2  --   HCT 42.1 40.2 44.4  --   PLT 114* 102* 115*  --   APTT  --   --   --  28  LABPROT  --   --   --  14.7  INR  --   --   --  1.1  CREATININE 0.54 0.59 0.59  --   TROPONINIHS  --   --  3  --     Estimated Creatinine Clearance: 124.9 mL/min (by C-G formula based on SCr of 0.59 mg/dL).   Medical History: Past Medical History:  Diagnosis Date   Acute encephalopathy 08/31/2020   Adjustment disorder with depressed mood 09/07/2020   Anxiety    Asthma    Chronic abdominal pain    Chronic back pain    Chronic prescription opiate use 02/16/2022   Cirrhosis (HCC)    Cirrhosis (HCC)    COPD (chronic obstructive pulmonary disease) (HCC)    Depression    Diabetes mellitus without complication (HCC)    Diabetes mellitus, type II (HCC)    Diverticulosis    DVT (deep venous thrombosis) (HCC) 02/04/2023   taking lovenox prior to procedure   Fatty liver 03/07/2020   GERD (gastroesophageal reflux disease)    Hepatitis C    Hernia,  abdominal    Hyperglycemia due to type 2 diabetes mellitus (HCC) 05/24/2020   Hyperlipidemia    IBS (irritable bowel syndrome) 02/16/2022   Insomnia    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)    Lupus    Migraine headache    Morbid obesity (HCC) 05/24/2020   Neuropathy    Nocturnal seizures (HCC)    Pain of upper abdomen 07/28/2021   Peptic ulcer    Spleen enlarged    Splenomegaly    Assessment: 37 year old female with history of multiple DVTs in the past to begin heparin for new DVT and thrombus with plans for thrombectomy.  On Lovenox PTA, none here since admission 11/13.  History of hepatitis C with thrombocytopenia - Plts 115 at baseline  Scr < 1  Goal of Therapy:  Heparin level 0.3-0.7 units/ml Monitor platelets by anticoagulation protocol: Yes   Plan:  Heparin 4000 units iv bolus x 1 Heparin drip at 1300 units / hr Heparin level in 6 hours Watch PLT count closely - daily CBC  Thank you Okey Regal, PharmD  09/16/2023,8:48 AM

## 2023-09-16 NOTE — Progress Notes (Signed)
PHARMACY - ANTICOAGULATION CONSULT NOTE  Pharmacy Consult for Heparin Indication: LLE DVT  Allergies  Allergen Reactions   Iodine-131 Anaphylaxis, Shortness Of Breath and Swelling   Ivp Dye [Iodinated Contrast Media] Anaphylaxis    Skin gets very red, unable to walk    Ketorolac Tromethamine Shortness Of Breath   Tylenol [Acetaminophen] Other (See Comments)    Due to cirrhosis    Vancomycin Swelling    Facial swelling,    Gabapentin Itching   Nsaids Other (See Comments)    Flares ulcers   Suboxone [Buprenorphine Hcl-Naloxone Hcl]     Withdrawal symptoms     Patient Measurements: Height: 5\' 9"  (175.3 cm) Weight: 110.3 kg (243 lb 2.7 oz) IBW/kg (Calculated) : 66.2 Heparin Dosing Weight: 93 kg  Vital Signs: Temp: 98.5 F (36.9 C) (11/14 1027) Temp Source: Oral (11/14 1027) BP: 117/80 (11/14 1724) Pulse Rate: 91 (11/14 1724)  Labs: Recent Labs    09/14/23 1703 09/15/23 1747 09/15/23 2312 09/16/23 0950  HGB 12.6 14.2  --  12.7  HCT 40.2 44.4  --  39.7  PLT 102* 115*  --  144*  APTT  --   --  28  --   LABPROT  --   --  14.7  --   INR  --   --  1.1  --   CREATININE 0.59 0.59  --  0.71  TROPONINIHS  --  3  --   --     Estimated Creatinine Clearance: 124.9 mL/min (by C-G formula based on SCr of 0.71 mg/dL).   Assessment: 37 year old female with history of multiple DVTs in the past to begin heparin for new DVT and thrombus with plans for thrombectomy.  On Lovenox PTA, none here since admission 11/13. History of hepatitis C with thrombocytopenia - Plts 115 at baseline  S/p thrombectomy 11/14 - subacute clot removed. Chronic DVT remains. To restart heparin post procedure.  Heparin was stopped in lab at 1450. 7000 units heparin boluses given in lab - last ~1700.   Goal of Therapy:  Heparin level 0.3-0.7 units/ml Monitor platelets by anticoagulation protocol: Yes   Plan:  Restart heparin 1300 units/hr Will f/u 6 hr heparin level post restart  Christoper Fabian,  PharmD, BCPS Please see amion for complete clinical pharmacist phone list 09/16/2023,5:25 PM

## 2023-09-17 ENCOUNTER — Other Ambulatory Visit (HOSPITAL_COMMUNITY): Payer: Self-pay

## 2023-09-17 ENCOUNTER — Other Ambulatory Visit (HOSPITAL_COMMUNITY): Payer: MEDICAID

## 2023-09-17 ENCOUNTER — Encounter (HOSPITAL_COMMUNITY): Payer: Self-pay | Admitting: Vascular Surgery

## 2023-09-17 DIAGNOSIS — I824Y2 Acute embolism and thrombosis of unspecified deep veins of left proximal lower extremity: Secondary | ICD-10-CM | POA: Diagnosis not present

## 2023-09-17 LAB — LIPID PANEL
Cholesterol: 119 mg/dL (ref 0–200)
HDL: 31 mg/dL — ABNORMAL LOW (ref >40)
LDL Cholesterol: 70 mg/dL (ref 0–99)
Total CHOL/HDL Ratio: 3.8 {ratio}
Triglycerides: 92 mg/dL (ref <150)
VLDL: 18 mg/dL (ref 0–40)

## 2023-09-17 LAB — CBC
HCT: 31.6 % — ABNORMAL LOW (ref 36.0–46.0)
Hemoglobin: 10.1 g/dL — ABNORMAL LOW (ref 12.0–15.0)
MCH: 28.3 pg (ref 26.0–34.0)
MCHC: 32 g/dL (ref 30.0–36.0)
MCV: 88.5 fL (ref 80.0–100.0)
Platelets: 136 10*3/uL — ABNORMAL LOW (ref 150–400)
RBC: 3.57 MIL/uL — ABNORMAL LOW (ref 3.87–5.11)
RDW: 15 % (ref 11.5–15.5)
WBC: 9.4 10*3/uL (ref 4.0–10.5)
nRBC: 0 % (ref 0.0–0.2)

## 2023-09-17 LAB — GLUCOSE, CAPILLARY
Glucose-Capillary: 363 mg/dL — ABNORMAL HIGH (ref 70–99)
Glucose-Capillary: 238 mg/dL — ABNORMAL HIGH (ref 70–99)

## 2023-09-17 LAB — LEGIONELLA PNEUMOPHILA SEROGP 1 UR AG: L. pneumophila Serogp 1 Ur Ag: NEGATIVE

## 2023-09-17 LAB — HEPARIN LEVEL (UNFRACTIONATED): Heparin Unfractionated: 0.16 [IU]/mL — ABNORMAL LOW (ref 0.30–0.70)

## 2023-09-17 MED ORDER — ATORVASTATIN CALCIUM 80 MG PO TABS
80.0000 mg | ORAL_TABLET | Freq: Every day | ORAL | Status: DC
  Administered 2023-09-17: 80 mg via ORAL
  Filled 2023-09-17: qty 1

## 2023-09-17 MED ORDER — APIXABAN 5 MG PO TABS
5.0000 mg | ORAL_TABLET | Freq: Two times a day (BID) | ORAL | 2 refills | Status: DC
  Filled 2023-09-17: qty 60, 30d supply, fill #0

## 2023-09-17 MED ORDER — AZITHROMYCIN 500 MG PO TABS: 500.0000 mg | ORAL_TABLET | Freq: Every day | ORAL | Status: DC

## 2023-09-17 MED ORDER — APIXABAN 5 MG PO TABS: 5.0000 mg | ORAL_TABLET | Freq: Two times a day (BID) | ORAL | Status: DC

## 2023-09-17 MED ORDER — APIXABAN 5 MG PO TABS
10.0000 mg | ORAL_TABLET | Freq: Two times a day (BID) | ORAL | Status: DC
  Filled 2023-09-17: qty 2

## 2023-09-17 MED ORDER — AMOXICILLIN-POT CLAVULANATE 875-125 MG PO TABS
1.0000 | ORAL_TABLET | Freq: Two times a day (BID) | ORAL | 0 refills | Status: DC
  Filled 2023-09-17: qty 8, 4d supply, fill #0

## 2023-09-17 MED ORDER — APIXABAN 5 MG PO TABS
5.0000 mg | ORAL_TABLET | Freq: Two times a day (BID) | ORAL | Status: DC
  Administered 2023-09-17: 5 mg via ORAL

## 2023-09-17 MED ORDER — INSULIN GLARGINE-YFGN 100 UNIT/ML ~~LOC~~ SOLN
25.0000 [IU] | Freq: Every day | SUBCUTANEOUS | Status: DC
  Filled 2023-09-17: qty 0.25

## 2023-09-17 NOTE — Plan of Care (Signed)
  Problem: Education: Goal: Knowledge of General Education information will improve Description: Including pain rating scale, medication(s)/side effects and non-pharmacologic comfort measures Outcome: Adequate for Discharge   Problem: Health Behavior/Discharge Planning: Goal: Ability to manage health-related needs will improve Outcome: Adequate for Discharge   Problem: Clinical Measurements: Goal: Ability to maintain clinical measurements within normal limits will improve Outcome: Adequate for Discharge Goal: Will remain free from infection Outcome: Adequate for Discharge Goal: Diagnostic test results will improve Outcome: Adequate for Discharge Goal: Respiratory complications will improve Outcome: Adequate for Discharge Goal: Cardiovascular complication will be avoided Outcome: Adequate for Discharge   Problem: Nutrition: Goal: Adequate nutrition will be maintained Outcome: Adequate for Discharge   Problem: Coping: Goal: Level of anxiety will decrease Outcome: Adequate for Discharge   Problem: Pain Management: Goal: General experience of comfort will improve Outcome: Adequate for Discharge   Problem: Coping: Goal: Ability to adjust to condition or change in health will improve Outcome: Adequate for Discharge   Problem: Fluid Volume: Goal: Ability to maintain a balanced intake and output will improve Outcome: Adequate for Discharge

## 2023-09-17 NOTE — Progress Notes (Addendum)
Progress Note    09/17/2023 7:50 AM 1 Day Post-Op  Subjective:  no major complaints   Vitals:   09/17/23 0323 09/17/23 0747  BP: 108/73 113/65  Pulse: 99 99  Resp: 16 20  Temp: 98.2 F (36.8 C) 98.1 F (36.7 C)  SpO2: 96% 96%   Physical Exam: Cardiac:  regular Lungs:  non labored Incisions:  right internal jugular access site and left CF access site clean, dry and intact without swelling or hematoma Extremities: LLE well perfused and warm with palpable left DP Abdomen:  soft Neurologic: alert and oriented  CBC    Component Value Date/Time   WBC 9.4 09/17/2023 0426   RBC 3.57 (L) 09/17/2023 0426   HGB 10.1 (L) 09/17/2023 0426   HGB 13.5 01/01/2023 1408   HCT 31.6 (L) 09/17/2023 0426   HCT 41.9 01/01/2023 1408   PLT 136 (L) 09/17/2023 0426   PLT 55 (LL) 01/01/2023 1408   MCV 88.5 09/17/2023 0426   MCV 92 01/01/2023 1408   MCH 28.3 09/17/2023 0426   MCHC 32.0 09/17/2023 0426   RDW 15.0 09/17/2023 0426   RDW 13.1 01/01/2023 1408   LYMPHSABS 0.7 09/15/2023 1747   LYMPHSABS 0.7 01/01/2023 1408   MONOABS 0.6 09/15/2023 1747   EOSABS 0.1 09/15/2023 1747   EOSABS 0.1 01/01/2023 1408   BASOSABS 0.0 09/15/2023 1747   BASOSABS 0.0 01/01/2023 1408    BMET    Component Value Date/Time   NA 136 09/16/2023 1655   NA 143 01/01/2023 1408   K 4.6 09/16/2023 1655   CL 102 09/16/2023 1655   CO2 24 09/16/2023 0950   GLUCOSE 290 (H) 09/16/2023 1655   BUN 8 09/16/2023 1655   BUN 11 01/01/2023 1408   CREATININE 0.30 (L) 09/16/2023 1655   CREATININE 0.66 05/18/2022 1113   CALCIUM 8.1 (L) 09/16/2023 0950   GFRNONAA >60 09/16/2023 0950   GFRAA >60 08/01/2020 0704    INR    Component Value Date/Time   INR 1.1 09/15/2023 2312     Intake/Output Summary (Last 24 hours) at 09/17/2023 0750 Last data filed at 09/16/2023 2252 Gross per 24 hour  Intake 955.32 ml  Output --  Net 955.32 ml     Assessment/Plan:  37 y.o. female is s/p Ultrasound-guided access of  right IJ Ultrasound-guided access of the left common femoral vein IVC, left iliac and femoral venogram Mechanical thrombectomy of IVC, left common and external iliac veins Mechanical thrombectomy of left superficial femoral and deep femoral veins Manual pressure of both right IJ and left femoral vein access sites Moderate sedation, 149 minutes  1 Day Post-Op   Left leg a little better Left leg is well perfused and warm with palpable DP Left CF access and right internal jugular access soft without hematoma Order placed for thigh high compression stocking On IV heparin Transition to DOAC Recommend 3 months of anticoagulation Okay for discharge from vascular standpoint Will arrange outpatient follow up in 2 months with Dr. Morton Amy, PA-C [facility] [phone] 09/17/2023 7:50 AM  I have seen and evaluated the patient. I agree with the PA note as documented above.  Patient underwent right IJ access yesterday with mechanical thrombectomy of the IVC as well as left iliac veins.  The filter was placed only about 3 weeks ago and has residual thrombus and and unable to successfully retrieve yesterday by Dr. Hetty Blend.  Tolerating heparin this morning.  States her left leg feels better.  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  Platelets appear to be stable as she was recently [facility] and had profound thrombocytopenia requiring the filter placement.  Can transition to DOAC from our standpoint.  Will arrange follow-up in several months with Dr. Hetty Blend to discuss future IVC filter removal.  Discussed elevation and will get her compression stocking to the left leg.  Cephus Shelling, MD [facility] [place] Office: [phone]

## 2023-09-17 NOTE — TOC Benefit Eligibility Note (Signed)
Patient Product/process development scientist completed.    The patient is insured through [employer] [place] .     Ran test claim for Eliquis 5 mg and the current 30 day co-pay is $4.00.   This test claim was processed through [facility]- copay amounts may vary at other pharmacies due to pharmacy/plan contracts, or as the patient moves through the different stages of their insurance plan.     Roland Earl, CPHT Pharmacy Technician III Certified Patient Advocate [facility] Pharm[facility] Patient Advocate Team Direct Number: [phone]  Fax: [phone]

## 2023-09-17 NOTE — Discharge Instructions (Addendum)
Discharge Instructions for DVT (Deep Vein Thrombosis):  Call 911 or visit the nearest emergency room if you experience any of the following:  Sudden chest tightness or pain. Sharp pain when taking a deep breath. Cough with bloody mucus. Sudden shortness of breath or fast breathing A fast heart rate. Cold skin, clammy skin, or sweating. New swelling in the arms or legs.  Continue to take your anticoagulation medication daily as prescribed Elevate your affected extremity daily above the level of the heart Wear your thigh high compression stocking daily You will have follow up with your Vascular provider in 1 month with an ultrasound   Information on my medicine - ELIQUIS (apixaban)  This medication education was reviewed with me or my healthcare representative as part of my discharge preparation.    Why was Eliquis prescribed for you? Eliquis was prescribed to treat blood clots that may have been found in the veins of your legs (deep vein thrombosis) or in your lungs (pulmonary embolism) and to reduce the risk of them occurring again.  What do You need to know about Eliquis ? Continue Eliquis 5 mg tablet taken TWICE daily.  Eliquis may be taken with or without food.   Try to take the dose about the same time in the morning and in the evening. If you have difficulty swallowing the tablet whole please discuss with your pharmacist how to take the medication safely.  Take Eliquis exactly as prescribed and DO NOT stop taking Eliquis without talking to the doctor who prescribed the medication.  Stopping may increase your risk of developing a new blood clot.  Refill your prescription before you run out.  After discharge, you should have regular check-up appointments with your healthcare provider that is prescribing your Eliquis.    What do you do if you miss a dose? If a dose of ELIQUIS is not taken at the scheduled time, take it as soon as possible on the same day and twice-daily  administration should be resumed. The dose should not be doubled to make up for a missed dose.  Important Safety Information A possible side effect of Eliquis is bleeding. You should call your healthcare provider right away if you experience any of the following: Bleeding from an injury or your nose that does not stop. Unusual colored urine (red or dark brown) or unusual colored stools (red or black). Unusual bruising for unknown reasons. A serious fall or if you hit your head (even if there is no bleeding).  Some medicines may interact with Eliquis and might increase your risk of bleeding or clotting while on Eliquis. To help avoid this, consult your healthcare provider or pharmacist prior to using any new prescription or non-prescription medications, including herbals, vitamins, non-steroidal anti-inflammatory drugs (NSAIDs) and supplements.  This website has more information on Eliquis (apixaban): http://www.eliquis.com/eliquis/home

## 2023-09-17 NOTE — Progress Notes (Signed)
PHARMACIST - PHYSICIAN COMMUNICATION DR:   Renford Dills CONCERNING: Antibiotic IV to Oral Route Change Policy  RECOMMENDATION: This patient is receiving azithromycin by the intravenous route.  Based on criteria approved by the Pharmacy and Therapeutics Committee, the antibiotic(s) is/are being converted to the equivalent oral dose form(s).   DESCRIPTION: These criteria include: Patient being treated for a respiratory tract infection, urinary tract infection, cellulitis or clostridium difficile associated diarrhea if on metronidazole The patient is not neutropenic and does not exhibit a GI malabsorption state The patient is eating (either orally or via tube) and/or has been taking other orally administered medications for a least 24 hours The patient is improving clinically and has a Tmax < 100.5  If you have questions about this conversion, please contact the Pharmacy Department  []   [phone] )  [facility] []   [phone] )  [facility] [x]   [phone] )  [facility] []   [phone] )  [facility] []   [phone] )  Il[facility] Qua    Celedonio Miyamoto, PharmD, [place] Clinical Pharmacist Phone [phone]

## 2023-09-17 NOTE — Plan of Care (Signed)
  Problem: Activity: Goal: Risk for activity intolerance will decrease Outcome: Progressing   Problem: Nutrition: Goal: Adequate nutrition will be maintained Outcome: Progressing   Problem: Coping: Goal: Level of anxiety will decrease Outcome: Progressing   Problem: Elimination: Goal: Will not experience complications related to bowel motility Outcome: Progressing   Problem: Elimination: Goal: Will not experience complications related to urinary retention Outcome: Progressing   Problem: Pain Management: Goal: General experience of comfort will improve Outcome: Progressing   Problem: Safety: Goal: Ability to remain free from injury will improve Outcome: Progressing   Problem: Education: Goal: Ability to describe self-care measures that may prevent or decrease complications (Diabetes Survival Skills Education) will improve Outcome: Progressing   Problem: Education: Goal: Individualized Educational Video(s) Outcome: Progressing   Problem: Coping: Goal: Ability to adjust to condition or change in health will improve Outcome: Progressing

## 2023-09-17 NOTE — Progress Notes (Signed)
D/C meds given to patient from [facility] pharmacy. D/C instructions given to patient.

## 2023-09-17 NOTE — Telephone Encounter (Signed)
Spoke to pt. She will check with pharmacy. She was not sure if they had medication in stock or not but chart states It has been filled.

## 2023-09-17 NOTE — Progress Notes (Addendum)
PHARMACY - ANTICOAGULATION CONSULT NOTE  Pharmacy Consult for Heparin>>apixaban Indication: LLE DVT  Allergies  Allergen Reactions   Iodine-131 Anaphylaxis, Shortness Of Breath and Swelling   Ivp Dye [Iodinated Contrast Media] Anaphylaxis    Skin gets very red, unable to walk    Ketorolac Tromethamine Shortness Of Breath   Tylenol [Acetaminophen] Other (See Comments)    Due to cirrhosis    Vancomycin Swelling    Facial swelling,    Gabapentin Itching   Nsaids Other (See Comments)    Flares ulcers   Suboxone [Buprenorphine Hcl-Naloxone Hcl] Other (See Comments)    Withdrawal symptoms     Patient Measurements: Height: 5\' 9"  (175.3 cm) Weight: 113.7 kg (250 lb 9.6 oz) IBW/kg (Calculated) : 66.2 Heparin Dosing Weight: 93 kg  Vital Signs: Temp: 98.1 F (36.7 C) (11/15 0747) Temp Source: Oral (11/15 0747) BP: 113/65 (11/15 0747) Pulse Rate: 99 (11/15 0747)  Labs: Recent Labs    09/15/23 1747 09/15/23 2312 09/16/23 0950 09/16/23 1655 09/17/23 0045 09/17/23 0426  HGB 14.2  --  12.7 11.2*  --  10.1*  HCT 44.4  --  39.7 33.0*  --  31.6*  PLT 115*  --  144*  --   --  136*  APTT  --  28  --   --   --   --   LABPROT  --  14.7  --   --   --   --   INR  --  1.1  --   --   --   --   HEPARINUNFRC  --   --   --   --  0.16*  --   CREATININE 0.59  --  0.71 0.30*  --   --   TROPONINIHS 3  --   --   --   --   --     Estimated Creatinine Clearance: 127 mL/min (A) (by C-G formula based on SCr of 0.3 mg/dL (L)).   Assessment: 37 year old female with history of multiple DVTs in the past to begin heparin for new DVT and thrombus with plans for thrombectomy.  On Lovenox PTA, none here since admission 11/13. History of hepatitis C with thrombocytopenia - Plts 115 at baseline  S/p thrombectomy 11/14 - subacute clot removed. Chronic DVT remains. To restart heparin post procedure.  Heparin was stopped in lab at 1450. 7000 units heparin boluses given in lab - last ~1700.   D/w Dr  Renford Dills and pt today about ongoing plan for anticoagulation. She has never been on oral NOAC prior to this. She has been on SQ lovenox since April prior to stopping in Oct after the [facility] visit. According to their note, they were planning to put her on NOAC.   Goal of Therapy:  Monitor platelets by anticoagulation protocol: Yes   Plan:  Dc heparin Apixaban 5mg  BID Rx will follow peripherally  Ulyses Southward, PharmD, BCIDP, AAHIVP, CPP Infectious Disease Pharmacist 09/17/2023 9:44 AM

## 2023-09-17 NOTE — Discharge Summary (Signed)
Physician Discharge Summary  CARLEENA NEUPERT ZOX:[id number] DOB: 1986/01/21 DOA: 09/15/2023  PCP: Rica Records, FNP  Admit date: 09/15/2023 Discharge date: 09/17/2023  Admitted From: Home Disposition:  Home  Discharge Condition:Stable CODE STATUS:FULL Diet recommendation: Heart Healthy  Brief/Interim Summary: Patient is a 37 year old female with history of diabetes type 2, GERD, tobacco use, DVT who presented to the emergency department at [facility] with complaint of 2 to 3 days onset of left leg pain, shortness of breath.  She was recently diagnosed with pneumonia and treated with antibiotics.  She was recently admitted to [facility] on October 24 211/1 due to acute on chronic DVT of left lower extremity, she was on Lovenox.  IR was consulted and she underwent IVC filter placement on 10/26. On presentation, she was hemodynamically stable.  Lab work showed D-dimer of 19.1.  CT venogram of abdomen and pelvis was positive for occlusive DVT within the left lower extremity, extension of the occlusive thrombus extending all the left external iliac and common iliac veins, thrombus within the IVC filter.  CT angiogram of the chest showed suboptimal opacification of the pulmonary system, no definitive acute PE to the segmental level.  Chest x-ray did not show any active disease.  Vascular surgery consulted and patient was transferred to [facility] for possible surgical intervention for recurrent left lower extremity DVT.  S/P mechanical thrombectomy on 11/14.  Vascular surgery cleared for discharge today.  She will follow-up with vascular surgery as an outpatient.  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  Elevated D-dimer.  Recurrent left lower extremity DVT.  Had IVC filter thrombus.  Started  on heparin drip.  Vascular surgery consulted.  Had elevated D-dimer.  CT angiogram did not show  any acute PE. Vascular surgery did mechanical thrombectomy .  She was taking Lovenox at home.  She has been transitioned to Eliquis.  She will follow-up with vascular surgery in 2 months.  Dyspnea/suspected, community acquired pneumonia: Recently treated for pneumonia.  CTA showed a small left-sided pleural effusion with consolidative space disease in the left lower lobe which could be either atelectasis or pneumonia.  Started on ceftriaxone, azithromycin.   She remains on room air.  Denies any shortness of breath or cough.  Antibiotics changed to oral.   Acute on chronic diastolic congestive heart failure: Takes torsemide at home.  Currently overall euvolemic.   Cirrhosis/NASH/hepatitis C: She says she has been treated for hepatitis C. 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   Diabetes type 2: On glipizide, metformin, insulin at home. Recent A1c of 9.4 as per 07/25/23.  Continue home regimen on discharge.   Thrombocytopenia: Likely associated with chronic liver disease in the setting of cirrhosis.  She follows with hematology as an outpatient.   GERD: Continue Protonix   Tobacco use: Counseled for cessation.  Continue nicotine patch   Morbid obesity: BMI of 35  Discharge Diagnoses:  Principal Problem:   Acute deep vein thrombosis (DVT) of left lower extremity (HCC) Active Problems:   CAP (community acquired pneumonia)   Gastroesophageal reflux disease   Hyperglycemia due to type 2 diabetes mellitus (HCC)   Tobacco use disorder   Thrombocytopenia due to hypersplenism   Elevated d-dimer   Elevated brain natriuretic peptide (BNP) level   Hypoalbuminemia due to protein-calorie malnutrition [facility])    Discharge Instructions  Discharge Instructions     Diet -  low sodium heart healthy   Complete by: As directed    Discharge instructions   Complete by: As directed    1)Please take prescribed medications as instructed 2)Follow up with vascular surgery as an outpatient.   You will be called for appointment. 3)Follow up with your PCP in 1 to 2 weeks   Increase activity slowly   Complete by: As directed       Allergies as of 09/17/2023       Reactions   Iodine-131 Anaphylaxis, Shortness Of Breath, Swelling   Ivp Dye [iodinated Contrast Media] Anaphylaxis   Skin gets very red, unable to walk   Ketorolac Tromethamine Shortness Of Breath   Tylenol [acetaminophen] Other (See Comments)   Due to cirrhosis   Vancomycin Swelling   Facial swelling,    Gabapentin Itching   Nsaids Other (See Comments)   Flares ulcers   Suboxone [buprenorphine Hcl-naloxone Hcl] Other (See Comments)   Withdrawal symptoms         Medication List     STOP taking these medications    doxycycline 100 MG capsule Commonly known as: VIBRAMYCIN   furosemide 40 MG tablet Commonly known as: LASIX   lubiprostone 24 MCG capsule Commonly known as: Amitiza   pantoprazole 20 MG tablet Commonly known as: PROTONIX   tirzepatide 2.5 MG/0.5ML Pen Commonly known as: MOUNJARO       TAKE these medications    Accu-Chek Guide test strip Generic drug: glucose blood Use as instructed to check blood glucose four times daily   albuterol 108 (90 Base) MCG/ACT inhaler Commonly known as: VENTOLIN HFA Inhale 2 puffs into the lungs every 6 (six) hours as needed for wheezing or shortness of breath.   amoxicillin-clavulanate 875-125 MG tablet Commonly known as: AUGMENTIN Take 1 tablet by mouth 2 (two) times daily.   apixaban 5 MG Tabs tablet Commonly known as: ELIQUIS Take 1 tablet (5 mg total) by mouth 2 (two) times daily.   ARIPiprazole 20 MG tablet Commonly known as: ABILIFY Take 20 mg by mouth daily.   cetirizine 10 MG tablet Commonly known as: ZYRTEC Take 10 mg by mouth daily.   clonazePAM 0.5 MG tablet Commonly known as: KLONOPIN Take 0.5 mg by mouth 2 (two) times daily as needed for anxiety.   cyclobenzaprine 10 MG tablet Commonly known as: FLEXERIL Take 1  tablet (10 mg total) by mouth 3 (three) times daily as needed for muscle spasms. What changed: when to take this   Dexcom G6 Transmitter Misc USE AS DIRECTED.   enoxaparin 100 MG/ML injection Commonly known as: LOVENOX Inject 1 mL (100 mg total) into the skin every 12 (twelve) hours.   famotidine 20 MG tablet Commonly known as: PEPCID Take 1 tablet (20 mg total) by mouth 2 (two) times daily. What changed: when to take this   ferrous sulfate 325 (65 FE) MG EC tablet Take 1 tablet (325 mg total) by mouth daily with breakfast.   glipiZIDE 5 MG 24 hr tablet Commonly known as: GLUCOTROL XL take 1 tablet by mouth daily with breakfast.   HumuLIN R U-500 KwikPen 500 UNIT/ML KwikPen Generic drug: insulin regular human CONCENTRATED Inject 90 Units into the skin 3 (three) times daily with meals.   hydrOXYzine 50 MG capsule Commonly known as: VISTARIL Take 1 capsule (50 mg total) by mouth 3 (three) times daily as needed. What changed: reasons to take this   hyoscyamine 0.125 MG Tbdp disintergrating tablet Commonly known as: ANASPAZ TAKE 1 TABLET UNDER  THE TONGUE THREE TIMES A DAY AS NEEDED FOR BLADDER SPASMS OR CRAMPING. What changed: See the new instructions.   lidocaine 5 % ointment Commonly known as: XYLOCAINE Apply 1 Application topically as needed. What changed: reasons to take this   Linzess 290 MCG Caps capsule Generic drug: linaclotide Take 290 mcg by mouth daily.   lisinopril 5 MG tablet Commonly known as: ZESTRIL Take 5 mg by mouth daily.   metFORMIN 1000 MG tablet Commonly known as: GLUCOPHAGE take 1 tablet by mouth 2 times a day with a meal. What changed: See the new instructions.   methadone 10 MG/ML solution Commonly known as: DOLOPHINE Take 65 mg by mouth daily.   methocarbamol 500 MG tablet Commonly known as: ROBAXIN Take 500 mg by mouth 2 (two) times daily. What changed: Another medication with the same name was removed. Continue taking this  medication, and follow the directions you see here.   omeprazole 40 MG capsule Commonly known as: PRILOSEC TAKE 1 CAPSULE BY MOUTH TWICE DAILY.   ondansetron 4 MG tablet Commonly known as: ZOFRAN Take 1 tablet (4 mg total) by mouth every 8 (eight) hours as needed for nausea or vomiting.   Oxycodone HCl 10 MG Tabs Take 10 mg by mouth 4 (four) times daily as needed (pain).   Pen Needles 32G X 6 MM Misc 1 each by Does not apply route 3 (three) times daily.   potassium chloride 10 MEQ tablet Commonly known as: KLOR-CON Take 10 mEq by mouth 2 (two) times daily.   pregabalin 75 MG capsule Commonly known as: LYRICA Take 2 capsules by mouth 2 (two) times daily.   propranolol 10 MG tablet Commonly known as: INDERAL Take 10 mg by mouth 3 (three) times daily.   QUEtiapine 25 MG tablet Commonly known as: SEROQUEL Take 25 mg by mouth at bedtime.   torsemide 20 MG tablet Commonly known as: DEMADEX Take 20 mg by mouth 2 (two) times daily.   venlafaxine XR 75 MG 24 hr capsule Commonly known as: Effexor XR Take 1 capsule (75 mg total) by mouth daily with breakfast.   Vitamin D (Ergocalciferol) 1.25 MG (50000 UNIT) Caps capsule Commonly known as: DRISDOL Take 1 capsule (50,000 Units total) by mouth every 7 (seven) days. What changed: when to take this        Follow-up Information     [name], MD Follow up in 2 month(s).   Specialty: Vascular Surgery Why: The office will call you with your appointment Contact information: [address] [place] [address] [phone]                Allergies  Allergen Reactions   Iodine-131 Anaphylaxis, Shortness Of Breath and Swelling   Ivp Dye [Iodinated Contrast Media] Anaphylaxis    Skin gets very red, unable to walk    Ketorolac Tromethamine Shortness Of Breath   Tylenol [Acetaminophen] Other (See Comments)    Due to cirrhosis    Vancomycin Swelling    Facial swelling,    Gabapentin Itching   Nsaids  Other (See Comments)    Flares ulcers   Suboxone [Buprenorphine Hcl-Naloxone Hcl] Other (See Comments)    Withdrawal symptoms     Consultations: Vascular surgery   Procedures/Studies: PERIPHERAL VASCULAR CATHETERIZATION  Result Date: 09/16/2023 Images from the original result were not included. Patient name: CATHIA ECKENRODE MRN: [id number] DOB: 1985/11/20 Sex: female 09/16/2023 Pre-operative Diagnosis: Acute on chronic left iliofemoral DVT with IVC extension Post-operative diagnosis:  Same Surgeon:  [name], MD Procedure Performed: Ultrasound-guided access of right IJ Ultrasound-guided access of the left common femoral vein IVC, left iliac and femoral venogram Mechanical thrombectomy of IVC, left common and external iliac veins Mechanical thrombectomy of left superficial femoral and deep femoral veins Manual pressure of both right IJ and left femoral vein access sites Moderate sedation, 149 minutes Indications:  TENEISHA SHULER is a 37 y.o. female, who presented to the [facility] emergency department yesterday with worsening pain and swelling in her left leg.  The [facility] for a left leg DVT.  This was acute on chronic as she had had a DVT back in March.  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  She also underwent splenic embolization.  Her platelet count has improved.  Her pain and swelling has gotten worse.  She does have a IVC filter in place that was inserted 3 weeks ago.  While at [facility], a CT venogram revealed thrombus within and above her IVC filter as well as what appears to be acute thrombus within her left iliac system as well as the femoral-popliteal veins.  Findings: Subacute clot removed from IVC, left common, external iliac and common femoral veins.  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  Procedure:  The patient was identified in the holding  area and taken to the cath lab  The patient was then placed supine on the table and prepped and draped in the usual sterile fashion.  A time out was called.  Ultrasound was used to evaluate the right internal jugular vein which was patent.  Under ultrasound guidance the area was anesthetized with lidocaine and the micropuncture access needle was used to access the right IJ.  The 018 wire was placed through this and into the right atrium.  The micro sheath was then placed over this.  A glide advantage wire was used to navigate through the right atrium and into the IVC.  The micro sheath was then exchanged for an 8 [place] sheath.  An ACT was then drawn and she was on a heparin drip which resulted to be subtherapeutic so an 8000 unit bolus of heparin was given.  I then attempted multiple times to cannulate the left femoral vein from this IJ access site although since there was a patent internal iliac vein and pelvis circulation I cannot get the wire to cross into the external iliac vein.  The glide advantage wire was placed down to the left internal iliac vein and the 8 French sheath was exchanged for the 16 French dry seal sheath.  A cavogram was obtained which demonstrated on the top of the IVC filter.  The 16 French flash penumbra catheter was used to thrombectomized this area of the IVC filter with 2 passes.  Cavogram demonstrated minimal residual clot at the filter.  The left common femoral vein was then accessed under ultrasound guidance with the micropuncture access needle.  The 018 wire was advanced and the micro sheath was placed over this wire.  An 035 stiff angled glide was then passed through which easily tracked through the left external iliac and common iliac veins and into the IVC.  Using this as a marker I was able to cannulate the left external iliac vein with the glide advantage wire and bear catheter from the right IJ access and a wire was passed down into the left  superficial femoral vein.  Venogram  demonstrated filling defects with extensive collateralization to the pelvic system.  I then made multiple passes with a 16 French penumbra clearing the common iliac and external iliac veins.  Attention was then turned to the common femoral, superficial and deep femoral veins. The wire was passed into the superficial femoral vein but there was a significant stenosis or occlusion in the 16 French penumbra was unable to pass further than the proximal portion of the superficial femoral vein.  There was also chronic thrombus occluding the deep femoral vein and I was unable to get a wire to pass.  The 16 French penumbra was able to engage although did not result in thrombus extrusion.  Venogram demonstrated flow through the main external iliac vein with less filling of the collaterals.  My partner Dr. Chestine Spore came in and also assessed the images and we agreed that since the wire would not pass into the deep femoral vein that this was likely a chronic occlusion.  Cavogram then demonstrated more thrombus in the filter so the 16 French penumbra was again passed through the dry seal sheath and 2 more passes were made with good resolution and minimal residual thrombus.  I then attempted to remove the filter with a Cook IVC filter retrieval kit although was unable to snare the hook.  It appeared that the snare leaflets would bounce around the hook leading me to believe that there was still residual thrombus at the top of the filter.  For this reason I elected to leave the filter in place and plan for 3 months of anticoagulation.  The 16 French sheath was removed from the right IJ and manual pressure was held.  The micro sheath in the left common femoral vein was removed and manual pressure was held.  Both access sites were demonstrated excellent hemostasis. Contrast: 125 cc Impression: Acute on chronic thrombus of the left common and external iliac veins.  Chronic occlusions of the left profunda and left superficial femoral  veins. Residual thrombus on the IVC filter Plan for 3 months of anticoagulation should her platelets remain stable and with then consider filter removal after that time. [name] MD [facility] Office: [phone]  CT VENOGRAM ABD/PEL  Result Date: 09/15/2023 CLINICAL DATA:  DVT EXAM: CT VENOGRAM ABDOMEN AND PELVIS TECHNIQUE: Venographic phase images of the abdomen, pelvis and lower extremities were obtained following the administration of intravenous contrast. Multiplanar reformats and maximum intensity projections were generated. RADIATION DOSE REDUCTION: This exam was performed according to the departmental dose-optimization program which includes automated exposure control, adjustment of the mA and/or kV according to patient size and/or use of iterative reconstruction technique. CONTRAST:  OMNIPAQUE IOHEXOL 350 MG/ML SOLN COMPARISON:  Ultrasound 09/13/2023, CT 01/07/2023, 11/08/2021 FINDINGS: Lower chest: See separately dictated chest CT. Small left-sided pleural effusion with consolidation at the left lung base. Hepatobiliary: Nodular hepatic contour suspicious for cirrhosis. Cholecystectomy. No biliary dilatation. Pancreas: Unremarkable. No pancreatic ductal dilatation or surrounding inflammatory changes. Spleen: Massively enlarged, craniocaudal measurement of 26 cm, previously 26 cm. Interval embolization coils at the splenic hilus. Heterogenous splenic enhancement with multiple geographic areas of non enhancement presumably due to infarcts. No evidence for hematoma. Extensive perisplenic collateral vessels. Adrenals/Urinary Tract: Adrenal glands are normal. Kidneys show no hydronephrosis. The bladder is decompressed Stomach/Bowel: The stomach is nonenlarged. There is no dilated small bowel. No acute bowel wall thickening. Vascular/Lymphatic: Nonaneurysmal aorta. No suspicious lymph nodes. Small retroperitoneal lymph nodes. Reproductive:  Uterus and bilateral  adnexa are unremarkable. Other: No free air. Moderate volume of ascites. Subcutaneous edema within the anterior pannus Musculoskeletal: No acute osseous abnormality IVC: IVC filter with apex at the L3 level. Thrombus within the IVC filter. Extension of thrombus above the filter apex approximately 2.6 cm cranial caudal, extends to just below the right renal vein origin. Portal and mesenteric veins: Small volume thrombus in the portal vein, series 5, image 34. question tiny amount of thrombus within the SMV, series 5, image 45. Bilateral iliac veins: Right iliac vein is patent. Positive for occlusive thrombus within the left common iliac vein and external iliac vein. Right lower extremity: No thrombus within the imaged portions of the right common femoral, deep femoral or femoral veins. Left lower extremity: Positive for occlusive thrombus within the left common femoral and imaged portions of the proximal left femoral vein. Small volume thrombus within the proximal deep femoral vein. IMPRESSION: 1. Positive for occlusive DVT within the left lower extremity as seen on prior ultrasound imaging. There is extension of occlusive thrombus to involve the left external iliac and common iliac veins. The distal IVC is patent. 2. IVC filter is present. Positive for thrombus within the IVC filter with extension of thrombus about 2.6 cm above the filter apex extending to just below the right renal vein origin. 3. Small volume thrombus in the portal vein. Question tiny amount of thrombus within the SMV. 4. Massively enlarged spleen with interval embolization coils at the splenic hilus. Heterogenous splenic enhancement with multiple geographic areas of non enhancement presumably due to infarcts. No evidence for splenic hematoma. 5. Cirrhosis with evidence for portal hypertension and moderate volume of ascites. 6. Small left-sided pleural effusion with consolidation at the left lung base. Electronically Signed   By: Jasmine Pang  M.D.   On: 09/15/2023 23:12   CT Angio Chest PE W and/or Wo Contrast  Result Date: 09/15/2023 CLINICAL DATA:  Shortness of breath EXAM: CT ANGIOGRAPHY CHEST WITH CONTRAST TECHNIQUE: Multidetector CT imaging of the chest was performed using the standard protocol during bolus administration of intravenous contrast. Multiplanar CT image reconstructions and MIPs were obtained to evaluate the vascular anatomy. RADIATION DOSE REDUCTION: This exam was performed according to the departmental dose-optimization program which includes automated exposure control, adjustment of the mA and/or kV according to patient size and/or use of iterative reconstruction technique. CONTRAST:  OMNIPAQUE IOHEXOL 350 MG/ML SOLN COMPARISON:  Chest x-ray 09/14/2023, CT chest 07/29/2020 FINDINGS: Cardiovascular: Suboptimal opacification of the pulmonary arterial system. No definite acute pulmonary embolus to the segmental level. Nonaneurysmal aorta. Cardiac size within normal limits. No pericardial effusion Mediastinum/Nodes: No enlarged mediastinal, hilar, or axillary lymph nodes. Thyroid gland, trachea, and esophagus demonstrate no significant findings. Lungs/Pleura: Small left-sided pleural effusion. Consolidative airspace disease in the left lower lobe. Small ground-glass focus at the right apex. Upper Abdomen: See separately dictated CT Musculoskeletal: No acute osseous abnormality Review of the MIP images confirms the above findings. IMPRESSION: 1. Suboptimal opacification of the pulmonary arterial system. No definite acute pulmonary embolus to the segmental level. 2. Small left-sided pleural effusion with consolidative airspace disease in the left lower lobe, either representing atelectasis or pneumonia. Small ground-glass focus at the right apex, suspect infectious in etiology. Electronically Signed   By: Jasmine Pang M.D.   On: 09/15/2023 22:52   DG Chest 2 View  Result Date: 09/14/2023 CLINICAL DATA:  SOB EXAM: CHEST - 2  VIEW COMPARISON:  X-ray 01/16/2023, CT chest 07/29/2020 FINDINGS: The  heart and mediastinal contours are within normal limits. Bibasilar atelectasis. No focal consolidation. No pulmonary edema. No pleural effusion. No pneumothorax. No acute osseous abnormality. IMPRESSION: No active cardiopulmonary disease. Electronically Signed   By: Tish Frederickson M.D.   On: 09/14/2023 19:52   US Venous Img Lower Unilateral Left  Result Date: 09/13/2023 CLINICAL DATA:  f/u left popliteal DVT EXAM: LEFT LOWER EXTREMITY VENOUS DOPPLER ULTRASOUND TECHNIQUE: Gray-scale sonography with compression, as well as color and duplex ultrasound, were performed to evaluate the deep venous system(s) from the level of the common femoral vein through the popliteal and proximal calf veins. COMPARISON:  Lower extremity venous duplex, 07/30/2020. CT AP, 03/09/2023. FINDINGS: VENOUS Heterogeneously-echogenic, occlusive filling defect within the imaged portions of the LEFT common femoral, superficial femoral, and popliteal veins. Extension to include imaged portions of profunda femoral and great saphenous vein. Difficult visualization of the calf veins. Limited views of the contralateral common femoral vein are unremarkable. OTHER No evidence abnormal fluid collection. Subcutaneous edema of the imaged distal LEFT lower extremity Limitations: none IMPRESSION: Acute, occlusive and extensive LEFT lower extremity DVT. The central extension of the thrombus is not visualized on this evaluation. Recommend CT venogram abdomen pelvis to evaluate/rule out pelvic versus caval extension, and consider consultation by vascular interventional specialist for potential further evaluation and management. These results will be called to the ordering clinician or representative by the Radiologist Assistant, and communication documented in the PACS or [employer]. Roanna Banning, MD [facility]  Electronically Signed   By: Roanna Banning M.D.   On: 09/13/2023 10:30      Subjective: Patient seen and examined the bedside today.  Hemodynamically stable.  Comfortable.  On room air.  Denies any chest pain or shortness of breath.  Left lower extremity pain significantly better.  Wants to go home today.  Medically stable for discharge  Discharge Exam: Vitals:   09/17/23 0323 09/17/23 0747  BP: 108/73 113/65  Pulse: 99 99  Resp: 16 20  Temp: 98.2 F (36.8 C) 98.1 F (36.7 C)  SpO2: 96% 96%   Vitals:   09/16/23 1939 09/16/23 2325 09/17/23 0323 09/17/23 0747  BP: 130/67 116/68 108/73 113/65  Pulse: (!) 113 100 99 99  Resp: 17 20 16 20   Temp: 98.3 F (36.8 C) 98.1 F (36.7 C) 98.2 F (36.8 C) 98.1 F (36.7 C)  TempSrc: Oral Oral Oral Oral  SpO2: 95% 96% 96% 96%  Weight:    113.7 kg  Height:        General: Pt is alert, awake, not in acute distress,obese Cardiovascular: RRR, S1/S2 +, no rubs, no gallops Respiratory: CTA bilaterally, no wheezing, no rhonchi Abdominal: Soft, NT, ND, bowel sounds + Extremities: left lower extremity edema, no cyanosis    The results of significant diagnostics from this hospitalization (including imaging, microbiology, ancillary and laboratory) are listed below for reference.     Microbiology: Recent Results (from the past 240 hour(s))  Culture, blood (Routine X 2) w Reflex to ID Panel     Status: None (Preliminary result)   Collection Time: 09/16/23  9:42 AM   Specimen: BLOOD RIGHT HAND  Result Value Ref Range Status   Specimen Description BLOOD RIGHT HAND  Final   Special Requests   Final    BOTTLES DRAWN AEROBIC AND ANAEROBIC Blood Culture adequate volume   Culture   Final    NO GROWTH < 24 HOURS Performed at [facility], [address], [place], [place] [address]    Report Status PENDING  Incomplete  Culture, blood (Routine X 2) w Reflex to ID Panel     Status: None (Preliminary result)   Collection Time: 09/16/23  9:50 AM    Specimen: BLOOD LEFT HAND  Result Value Ref Range Status   Specimen Description BLOOD LEFT HAND  Final   Special Requests   Final    BOTTLES DRAWN AEROBIC ONLY Blood Culture adequate volume   Culture   Final    NO GROWTH < 24 HOURS Performed at [facility], [address] [place], [place] [address]    Report Status PENDING  Incomplete     Labs: BNP (last 3 results) Recent Labs    09/14/23 1806 09/15/23 1747  BNP 102.0* 81.0   Basic Metabolic Panel: Recent Labs  Lab 09/13/23 1425 09/14/23 1703 09/15/23 1747 09/16/23 0950 09/16/23 1655  NA 136 132* 136 132* 136  K 3.4* 3.9 4.0 3.8 4.6  CL 102 102 100 101 102  CO2 26 22 24 24   --   GLUCOSE 323* 486* 382* 250* 290*  BUN 7 5* 6 6 8   CREATININE 0.54 0.59 0.59 0.71 0.30*  CALCIUM 8.2* 8.1* 8.5* 8.1*  --   MG  --   --   --  1.8  --   PHOS  --   --   --  2.4*  --    Liver Function Tests: Recent Labs  Lab 09/13/23 1425 09/14/23 1806 09/15/23 1747 09/16/23 0950  AST 25 21 22 22   ALT 27 21 24 22   ALKPHOS 170* 153* 196* 173*  BILITOT 1.2* 1.2* 1.4* 1.3*  PROT 6.3* 5.6* 7.2 6.0*  ALBUMIN 2.8* 2.5* 3.2* 2.5*   No results for input(s): "LIPASE", "AMYLASE" in the last 168 hours. No results for input(s): "AMMONIA" in the last 168 hours. CBC: Recent Labs  Lab 09/13/23 1425 09/14/23 1703 09/15/23 1747 09/16/23 0950 09/16/23 1655 09/17/23 0426  WBC 8.3 7.5 8.2 9.2  --  9.4  NEUTROABS 6.5 5.9 6.7  --   --   --   HGB 13.1 12.6 14.2 12.7 11.2* 10.1*  HCT 42.1 40.2 44.4 39.7 33.0* 31.6*  MCV 90.5 91.4 91.4 88.2  --  88.5  PLT 114* 102* 115* 144*  --  136*   Cardiac Enzymes: No results for input(s): "CKTOTAL", "CKMB", "CKMBINDEX", "TROPONINI" in the last 168 hours. BNP: Invalid input(s): "POCBNP" CBG: Recent Labs  Lab 09/16/23 1219 09/16/23 1747 09/16/23 2113 09/17/23 0212 09/17/23 0602  GLUCAP 214* 308* 530* 363* 238*   D-Dimer Recent Labs    09/14/23 1703  DDIMER 19.12*   Hgb A1c No  results for input(s): "HGBA1C" in the last 72 hours. Lipid Profile Recent Labs    09/17/23 0426  CHOL 119  HDL 31*  LDLCALC 70  TRIG 92  CHOLHDL 3.8   Thyroid function studies No results for input(s): "TSH", "T4TOTAL", "T3FREE", "THYROIDAB" in the last 72 hours.  Invalid input(s): "FREET3" Anemia work up No results for input(s): "VITAMINB12", "FOLATE", "FERRITIN", "TIBC", "IRON", "RETICCTPCT" in the last 72 hours. Urinalysis    Component Value Date/Time   COLORURINE YELLOW 01/07/2023 0815   APPEARANCEUR HAZY (A) 01/07/2023 0815   LABSPEC 1.010 01/07/2023 0815   PHURINE 7.0 01/07/2023 0815   GLUCOSEU NEGATIVE 01/07/2023 0815   HGBUR TRACE (A) 01/07/2023 0815   BILIRUBINUR NEGATIVE 01/07/2023 0815   BILIRUBINUR moderate (A) 10/03/2021 0817   KETONESUR NEGATIVE 01/07/2023 0815   PROTEINUR  NEGATIVE 01/07/2023 0815   UROBILINOGEN >=8.0 (A) 10/03/2021 0817   UROBILINOGEN 1.0 10/07/2013 0644   NITRITE POSITIVE (A) 01/07/2023 0815   LEUKOCYTESUR TRACE (A) 01/07/2023 0815   Sepsis Labs Recent Labs  Lab 09/14/23 1703 09/15/23 1747 09/16/23 0950 09/17/23 0426  WBC 7.5 8.2 9.2 9.4   Microbiology Recent Results (from the past 240 hour(s))  Culture, blood (Routine X 2) w Reflex to ID Panel     Status: None (Preliminary result)   Collection Time: 09/16/23  9:42 AM   Specimen: BLOOD RIGHT HAND  Result Value Ref Range Status   Specimen Description BLOOD RIGHT HAND  Final   Special Requests   Final    BOTTLES DRAWN AEROBIC AND ANAEROBIC Blood Culture adequate volume   Culture   Final    NO GROWTH < 24 HOURS Performed at [facility], [address] [place], [place] [address]    Report Status PENDING  Incomplete  Culture, blood (Routine X 2) w Reflex to ID Panel     Status: None (Preliminary result)   Collection Time: 09/16/23  9:50 AM   Specimen: BLOOD LEFT HAND  Result Value Ref Range Status   Specimen Description BLOOD LEFT HAND  Final   Special Requests   Final     BOTTLES DRAWN AEROBIC ONLY Blood Culture adequate volume   Culture   Final    NO GROWTH < 24 HOURS Performed at [facility], [address] [place], [place] [address]    Report Status PENDING  Incomplete    Please note: You were cared for by a hospitalist during your hospital stay. Once you are discharged, your primary care physician will handle any further medical issues. Please note that NO REFILLS for any discharge medications will be authorized once you are discharged, as it is imperative that you return to your primary care physician (or establish a relationship with a primary care physician if you do not have one) for your post hospital discharge needs so that they can reassess your need for medications and monitor your lab values.    Time coordinating discharge: 40 minutes  SIGNED:   Burnadette Pop, MD  [facility] 09/17/2023, 10:15 AM Pager [phone]  If 7PM-7AM, please contact night-coverage [email or web address] Password [email or web address]

## 2023-09-17 NOTE — Progress Notes (Signed)
PHARMACY - ANTICOAGULATION CONSULT NOTE  Pharmacy Consult for Heparin Indication: LLE DVT  Allergies  Allergen Reactions   Iodine-131 Anaphylaxis, Shortness Of Breath and Swelling   Ivp Dye [Iodinated Contrast Media] Anaphylaxis    Skin gets very red, unable to walk    Ketorolac Tromethamine Shortness Of Breath   Tylenol [Acetaminophen] Other (See Comments)    Due to cirrhosis    Vancomycin Swelling    Facial swelling,    Gabapentin Itching   Nsaids Other (See Comments)    Flares ulcers   Suboxone [Buprenorphine Hcl-Naloxone Hcl]     Withdrawal symptoms     Patient Measurements: Height: 5\' 9"  (175.3 cm) Weight: 110.3 kg (243 lb 2.7 oz) IBW/kg (Calculated) : 66.2 Heparin Dosing Weight: 93 kg  Vital Signs: Temp: 98.1 F (36.7 C) (11/14 2325) Temp Source: Oral (11/14 2325) BP: 116/68 (11/14 2325) Pulse Rate: 100 (11/14 2325)  Labs: Recent Labs    09/14/23 1703 09/15/23 1747 09/15/23 2312 09/16/23 0950 09/16/23 1655 09/17/23 0045  HGB 12.6 14.2  --  12.7 11.2*  --   HCT 40.2 44.4  --  39.7 33.0*  --   PLT 102* 115*  --  144*  --   --   APTT  --   --  28  --   --   --   LABPROT  --   --  14.7  --   --   --   INR  --   --  1.1  --   --   --   HEPARINUNFRC  --   --   --   --   --  0.16*  CREATININE 0.59 0.59  --  0.71 0.30*  --   TROPONINIHS  --  3  --   --   --   --     Estimated Creatinine Clearance: 124.9 mL/min (A) (by C-G formula based on SCr of 0.3 mg/dL (L)).   Assessment: 37 year old female with history of multiple DVTs in the past to begin heparin for new DVT and thrombus with plans for thrombectomy.  On Lovenox PTA, none here since admission 11/13. History of hepatitis C with thrombocytopenia - Plts 115 at baseline  S/p thrombectomy 11/14 - subacute clot removed. Chronic DVT remains. To restart heparin post procedure.  Heparin was stopped in lab at 1450. 7000 units heparin boluses given in lab - last ~1700.   11/15 AM update:  Heparin level  sub-therapeutic after re-start  Goal of Therapy:  Heparin level 0.3-0.7 units/ml Monitor platelets by anticoagulation protocol: Yes   Plan:  Inc heparin to 1500 units/hr 1000 heparin level  Abran Duke, PharmD, BCPS Clinical Pharmacist Phone: [phone]

## 2023-09-17 NOTE — Progress Notes (Signed)
PHARMACIST LIPID MONITORING   Jaime [name] is a 37 y.o. female admitted on 09/15/2023 with PAD/DVT.  Pharmacy has been consulted to optimize lipid-lowering therapy with the indication of secondary prevention for clinical ASCVD.  Recent Labs:  Lipid Panel (last 6 months):   Lab Results  Component Value Date   CHOL 119 09/17/2023   TRIG 92 09/17/2023   HDL 31 (L) 09/17/2023   CHOLHDL 3.8 09/17/2023   VLDL 18 09/17/2023   LDLCALC 70 09/17/2023    Hepatic function panel (last 6 months):   Lab Results  Component Value Date   AST 22 09/16/2023   ALT 22 09/16/2023   ALKPHOS 173 (H) 09/16/2023   BILITOT 1.3 (H) 09/16/2023   BILIDIR 0.5 (H) 09/14/2023   IBILI 0.7 09/14/2023    SCr (since admission):   Serum creatinine: 0.3 mg/dL (L) 16/10/96 0454 Estimated creatinine clearance: 127 mL/min (A)  Current therapy and lipid therapy tolerance Current lipid-lowering therapy: None Previous lipid-lowering therapies (if applicable):  Documented or reported allergies or intolerances to lipid-lowering therapies (if applicable):   Assessment:   Patient agrees with changes to lipid-lowering therapy  Plan:    1.Statin intensity (high intensity recommended for all patients regardless of the LDL):  Add or increase statin to high intensity.  2.Add ezetimibe (if any one of the following):   Not indicated at this time.  3.Refer to lipid clinic:   No  4.Follow-up with:  Primary care provider - Del Nigel Berthold, FNP  5.Follow-up labs after discharge:  Changes in lipid therapy were made. Check a lipid panel in 8-12 weeks then annually.       Ulyses Southward, PharmD, BCIDP, AAHIVP, CPP Infectious Disease Pharmacist 09/17/2023 9:45 AM

## 2023-09-20 ENCOUNTER — Ambulatory Visit (INDEPENDENT_AMBULATORY_CARE_PROVIDER_SITE_OTHER): Payer: MEDICAID | Admitting: Gastroenterology

## 2023-09-21 LAB — CULTURE, BLOOD (ROUTINE X 2)
Culture: NO GROWTH
Culture: NO GROWTH
Special Requests: ADEQUATE
Special Requests: ADEQUATE

## 2023-09-25 ENCOUNTER — Emergency Department (HOSPITAL_COMMUNITY): Payer: MEDICAID

## 2023-09-25 ENCOUNTER — Encounter (HOSPITAL_COMMUNITY): Payer: Self-pay | Admitting: Emergency Medicine

## 2023-09-25 ENCOUNTER — Encounter: Payer: Self-pay | Admitting: Emergency Medicine

## 2023-09-25 ENCOUNTER — Inpatient Hospital Stay (HOSPITAL_COMMUNITY)
Admission: EM | Admit: 2023-09-25 | Discharge: 2023-09-28 | DRG: 175 | Disposition: A | Discharge status: Skilled Nursing Facility | Payer: MEDICAID | Attending: Internal Medicine | Admitting: Internal Medicine

## 2023-09-25 ENCOUNTER — Ambulatory Visit
Admission: EM | Admit: 2023-09-25 | Discharge: 2023-09-25 | Disposition: A | Discharge status: Home / Self Care | Payer: MEDICAID | Attending: Nurse Practitioner | Admitting: Nurse Practitioner

## 2023-09-25 DIAGNOSIS — Z1152 Encounter for screening for COVID-19: Secondary | ICD-10-CM

## 2023-09-25 DIAGNOSIS — F32A Depression, unspecified: Secondary | ICD-10-CM | POA: Diagnosis present

## 2023-09-25 DIAGNOSIS — E1165 Type 2 diabetes mellitus with hyperglycemia: Secondary | ICD-10-CM | POA: Diagnosis present

## 2023-09-25 DIAGNOSIS — K219 Gastro-esophageal reflux disease without esophagitis: Secondary | ICD-10-CM | POA: Diagnosis present

## 2023-09-25 DIAGNOSIS — Z23 Encounter for immunization: Secondary | ICD-10-CM | POA: Diagnosis not present

## 2023-09-25 DIAGNOSIS — J4489 Other specified chronic obstructive pulmonary disease: Secondary | ICD-10-CM | POA: Diagnosis present

## 2023-09-25 DIAGNOSIS — J029 Acute pharyngitis, unspecified: Secondary | ICD-10-CM

## 2023-09-25 DIAGNOSIS — G894 Chronic pain syndrome: Secondary | ICD-10-CM | POA: Diagnosis present

## 2023-09-25 DIAGNOSIS — E1140 Type 2 diabetes mellitus with diabetic neuropathy, unspecified: Secondary | ICD-10-CM | POA: Diagnosis present

## 2023-09-25 DIAGNOSIS — E66812 Obesity, class 2: Secondary | ICD-10-CM | POA: Diagnosis present

## 2023-09-25 DIAGNOSIS — I2609 Other pulmonary embolism with acute cor pulmonale: Secondary | ICD-10-CM | POA: Diagnosis not present

## 2023-09-25 DIAGNOSIS — I2699 Other pulmonary embolism without acute cor pulmonale: Secondary | ICD-10-CM | POA: Diagnosis not present

## 2023-09-25 DIAGNOSIS — J9601 Acute respiratory failure with hypoxia: Secondary | ICD-10-CM | POA: Diagnosis present

## 2023-09-25 DIAGNOSIS — Z86718 Personal history of other venous thrombosis and embolism: Secondary | ICD-10-CM

## 2023-09-25 DIAGNOSIS — E785 Hyperlipidemia, unspecified: Secondary | ICD-10-CM | POA: Diagnosis present

## 2023-09-25 DIAGNOSIS — M329 Systemic lupus erythematosus, unspecified: Secondary | ICD-10-CM | POA: Diagnosis present

## 2023-09-25 DIAGNOSIS — F17200 Nicotine dependence, unspecified, uncomplicated: Secondary | ICD-10-CM | POA: Diagnosis present

## 2023-09-25 DIAGNOSIS — G4700 Insomnia, unspecified: Secondary | ICD-10-CM | POA: Diagnosis present

## 2023-09-25 DIAGNOSIS — F17210 Nicotine dependence, cigarettes, uncomplicated: Secondary | ICD-10-CM | POA: Diagnosis present

## 2023-09-25 DIAGNOSIS — I5032 Chronic diastolic (congestive) heart failure: Secondary | ICD-10-CM | POA: Diagnosis present

## 2023-09-25 DIAGNOSIS — F419 Anxiety disorder, unspecified: Secondary | ICD-10-CM | POA: Diagnosis present

## 2023-09-25 DIAGNOSIS — I82402 Acute embolism and thrombosis of unspecified deep veins of left lower extremity: Secondary | ICD-10-CM | POA: Diagnosis present

## 2023-09-25 DIAGNOSIS — K7581 Nonalcoholic steatohepatitis (NASH): Secondary | ICD-10-CM | POA: Diagnosis present

## 2023-09-25 DIAGNOSIS — Z8711 Personal history of peptic ulcer disease: Secondary | ICD-10-CM

## 2023-09-25 DIAGNOSIS — I2693 Single subsegmental pulmonary embolism without acute cor pulmonale: Secondary | ICD-10-CM | POA: Diagnosis present

## 2023-09-25 DIAGNOSIS — Z79891 Long term (current) use of opiate analgesic: Secondary | ICD-10-CM

## 2023-09-25 DIAGNOSIS — K7460 Unspecified cirrhosis of liver: Secondary | ICD-10-CM | POA: Diagnosis present

## 2023-09-25 DIAGNOSIS — Z79899 Other long term (current) drug therapy: Secondary | ICD-10-CM

## 2023-09-25 DIAGNOSIS — Z881 Allergy status to other antibiotic agents status: Secondary | ICD-10-CM

## 2023-09-25 DIAGNOSIS — Z8719 Personal history of other diseases of the digestive system: Secondary | ICD-10-CM

## 2023-09-25 DIAGNOSIS — Z91041 Radiographic dye allergy status: Secondary | ICD-10-CM

## 2023-09-25 DIAGNOSIS — Z7901 Long term (current) use of anticoagulants: Secondary | ICD-10-CM

## 2023-09-25 DIAGNOSIS — Z886 Allergy status to analgesic agent status: Secondary | ICD-10-CM

## 2023-09-25 DIAGNOSIS — Z888 Allergy status to other drugs, medicaments and biological substances status: Secondary | ICD-10-CM

## 2023-09-25 DIAGNOSIS — Z83438 Family history of other disorder of lipoprotein metabolism and other lipidemia: Secondary | ICD-10-CM

## 2023-09-25 DIAGNOSIS — I824Y2 Acute embolism and thrombosis of unspecified deep veins of left proximal lower extremity: Secondary | ICD-10-CM | POA: Diagnosis not present

## 2023-09-25 DIAGNOSIS — Z6837 Body mass index (BMI) 37.0-37.9, adult: Secondary | ICD-10-CM | POA: Diagnosis not present

## 2023-09-25 DIAGNOSIS — Z7984 Long term (current) use of oral hypoglycemic drugs: Secondary | ICD-10-CM

## 2023-09-25 DIAGNOSIS — G43909 Migraine, unspecified, not intractable, without status migrainosus: Secondary | ICD-10-CM | POA: Diagnosis present

## 2023-09-25 DIAGNOSIS — Z91199 Patient's noncompliance with other medical treatment and regimen due to unspecified reason: Secondary | ICD-10-CM

## 2023-09-25 LAB — CBC WITH DIFFERENTIAL/PLATELET
Abs Immature Granulocytes: 0.02 10*3/uL (ref 0.00–0.07)
Basophils Absolute: 0 10*3/uL (ref 0.0–0.1)
Basophils Relative: 0 %
Eosinophils Absolute: 0.1 10*3/uL (ref 0.0–0.5)
Eosinophils Relative: 2 %
HCT: 32.6 % — ABNORMAL LOW (ref 36.0–46.0)
Hemoglobin: 10.4 g/dL — ABNORMAL LOW (ref 12.0–15.0)
Immature Granulocytes: 0 %
Lymphocytes Relative: 15 %
Lymphs Abs: 0.8 10*3/uL (ref 0.7–4.0)
MCH: 29.1 pg (ref 26.0–34.0)
MCHC: 31.9 g/dL (ref 30.0–36.0)
MCV: 91.1 fL (ref 80.0–100.0)
Monocytes Absolute: 0.6 10*3/uL (ref 0.1–1.0)
Monocytes Relative: 11 %
Neutro Abs: 3.8 10*3/uL (ref 1.7–7.7)
Neutrophils Relative %: 72 %
Platelets: 116 10*3/uL — ABNORMAL LOW (ref 150–400)
RBC: 3.58 MIL/uL — ABNORMAL LOW (ref 3.87–5.11)
RDW: 15.6 % — ABNORMAL HIGH (ref 11.5–15.5)
WBC: 5.3 10*3/uL (ref 4.0–10.5)
nRBC: 0 % (ref 0.0–0.2)

## 2023-09-25 LAB — BASIC METABOLIC PANEL
Anion gap: 6 (ref 5–15)
BUN: 7 mg/dL (ref 6–20)
CO2: 24 mmol/L (ref 22–32)
Calcium: 8 mg/dL — ABNORMAL LOW (ref 8.9–10.3)
Chloride: 104 mmol/L (ref 98–111)
Creatinine, Ser: 0.46 mg/dL (ref 0.44–1.00)
GFR, Estimated: >60 mL/min (ref >60)
Glucose, Bld: 306 mg/dL — ABNORMAL HIGH (ref 70–99)
Potassium: 4.3 mmol/L (ref 3.5–5.1)
Sodium: 134 mmol/L — ABNORMAL LOW (ref 135–145)

## 2023-09-25 LAB — HEPARIN LEVEL (UNFRACTIONATED): Heparin Unfractionated: <0.1 [IU]/mL — ABNORMAL LOW (ref 0.30–0.70)

## 2023-09-25 LAB — COMPREHENSIVE METABOLIC PANEL
ALT: 19 U/L (ref 0–44)
AST: 19 U/L (ref 15–41)
Albumin: 2.5 g/dL — ABNORMAL LOW (ref 3.5–5.0)
Alkaline Phosphatase: 152 U/L — ABNORMAL HIGH (ref 38–126)
Anion gap: 7 (ref 5–15)
BUN: 6 mg/dL (ref 6–20)
CO2: 23 mmol/L (ref 22–32)
Calcium: 7.9 mg/dL — ABNORMAL LOW (ref 8.9–10.3)
Chloride: 106 mmol/L (ref 98–111)
Creatinine, Ser: 0.39 mg/dL — ABNORMAL LOW (ref 0.44–1.00)
GFR, Estimated: >60 mL/min (ref >60)
Glucose, Bld: 240 mg/dL — ABNORMAL HIGH (ref 70–99)
Potassium: 3.5 mmol/L (ref 3.5–5.1)
Sodium: 136 mmol/L (ref 135–145)
Total Bilirubin: 1 mg/dL (ref <1.2)
Total Protein: 5.9 g/dL — ABNORMAL LOW (ref 6.5–8.1)

## 2023-09-25 LAB — RESP PANEL BY RT-PCR (RSV, FLU A&B, COVID)  RVPGX2
Influenza A by PCR: NEGATIVE
Influenza B by PCR: NEGATIVE
Resp Syncytial Virus by PCR: NEGATIVE
SARS Coronavirus 2 by RT PCR: NEGATIVE

## 2023-09-25 LAB — PROTIME-INR
INR: 1.1 (ref 0.8–1.2)
Prothrombin Time: 14.3 s (ref 11.4–15.2)

## 2023-09-25 LAB — LIPASE, BLOOD: Lipase: 20 U/L (ref 11–51)

## 2023-09-25 LAB — TROPONIN I (HIGH SENSITIVITY)
Troponin I (High Sensitivity): 3 ng/L (ref <18)
Troponin I (High Sensitivity): 2 ng/L (ref <18)

## 2023-09-25 LAB — BRAIN NATRIURETIC PEPTIDE: B Natriuretic Peptide: 39 pg/mL (ref 0.0–100.0)

## 2023-09-25 LAB — APTT: aPTT: 27 s (ref 24–36)

## 2023-09-25 LAB — D-DIMER, QUANTITATIVE: D-Dimer, Quant: 4.34 ug{FEU}/mL — ABNORMAL HIGH (ref 0.00–0.50)

## 2023-09-25 LAB — HCG, SERUM, QUALITATIVE: Preg, Serum: NEGATIVE

## 2023-09-25 LAB — LACTIC ACID, PLASMA: Lactic Acid, Venous: 1.4 mmol/L (ref 0.5–1.9)

## 2023-09-25 MED ORDER — ONDANSETRON HCL 4 MG/2ML IJ SOLN
4.0000 mg | Freq: Once | INTRAMUSCULAR | Status: AC
  Administered 2023-09-25: 4 mg via INTRAVENOUS
  Filled 2023-09-25: qty 2

## 2023-09-25 MED ORDER — METHYLPREDNISOLONE SODIUM SUCC 40 MG IJ SOLR
40.0000 mg | Freq: Once | INTRAMUSCULAR | Status: AC
  Administered 2023-09-25: 40 mg via INTRAVENOUS
  Filled 2023-09-25: qty 1

## 2023-09-25 MED ORDER — DIPHENHYDRAMINE HCL 50 MG/ML IJ SOLN
50.0000 mg | Freq: Once | INTRAMUSCULAR | Status: AC
  Administered 2023-09-25: 50 mg via INTRAVENOUS
  Filled 2023-09-25: qty 1

## 2023-09-25 MED ORDER — INFLUENZA VIRUS VACC SPLIT PF (FLUZONE) 0.5 ML IM SUSY
0.5000 mL | PREFILLED_SYRINGE | INTRAMUSCULAR | Status: AC
  Administered 2023-09-26: 0.5 mL via INTRAMUSCULAR
  Filled 2023-09-25: qty 0.5

## 2023-09-25 MED ORDER — HYDROMORPHONE HCL 1 MG/ML IJ SOLN
0.5000 mg | Freq: Once | INTRAMUSCULAR | Status: AC
  Administered 2023-09-25: 0.5 mg via INTRAVENOUS
  Filled 2023-09-25: qty 0.5

## 2023-09-25 MED ORDER — HEPARIN BOLUS VIA INFUSION
5500.0000 [IU] | Freq: Once | INTRAVENOUS | Status: AC
Start: 2023-09-25 — End: 2023-09-25
  Administered 2023-09-25: 5500 [IU] via INTRAVENOUS

## 2023-09-25 MED ORDER — DIPHENHYDRAMINE HCL 25 MG PO CAPS
50.0000 mg | ORAL_CAPSULE | Freq: Once | ORAL | Status: AC
  Administered 2023-09-25: 50 mg via ORAL
  Filled 2023-09-25: qty 2

## 2023-09-25 MED ORDER — HYDROMORPHONE HCL 1 MG/ML IJ SOLN
1.0000 mg | Freq: Once | INTRAMUSCULAR | Status: AC
  Administered 2023-09-25: 1 mg via INTRAVENOUS
  Filled 2023-09-25: qty 1

## 2023-09-25 MED ORDER — HEPARIN (PORCINE) 25000 UT/250ML-% IV SOLN
2100.0000 [IU]/h | INTRAVENOUS | Status: DC
  Administered 2023-09-25: 1450 [IU]/h via INTRAVENOUS
  Administered 2023-09-26: 2300 [IU]/h via INTRAVENOUS
  Administered 2023-09-26: 1900 [IU]/h via INTRAVENOUS
  Administered 2023-09-27: 2200 [IU]/h via INTRAVENOUS
  Filled 2023-09-25 (×4): qty 250

## 2023-09-25 MED ORDER — IOHEXOL 350 MG/ML SOLN
100.0000 mL | Freq: Once | INTRAVENOUS | Status: AC | PRN
  Administered 2023-09-25: 100 mL via INTRAVENOUS

## 2023-09-25 MED ORDER — DIPHENHYDRAMINE HCL 25 MG PO CAPS: 50.0000 mg | ORAL_CAPSULE | Freq: Once | ORAL | Status: DC

## 2023-09-25 MED ORDER — NYSTATIN 100000 UNIT/ML MT SUSP: 5.0000 mL | Freq: Three times a day (TID) | OROMUCOSAL | 0 refills | Status: DC | PRN

## 2023-09-25 MED ORDER — PNEUMOCOCCAL 20-VAL CONJ VACC 0.5 ML IM SUSY
0.5000 mL | PREFILLED_SYRINGE | INTRAMUSCULAR | Status: AC
  Administered 2023-09-26: 0.5 mL via INTRAMUSCULAR
  Filled 2023-09-25: qty 0.5

## 2023-09-25 NOTE — ED Provider Notes (Signed)
Physical Exam  BP 117/69   Pulse (!) 105   Temp 99 F (37.2 C) (Oral)   Resp 19   Ht 5\' 9"  (1.753 m)   Wt 113.4 kg   LMP  (LMP Unknown)   SpO2 93%   BMI 36.92 kg/m   Physical Exam Vitals and nursing note reviewed.  Constitutional:      General: She is not in acute distress.    Appearance: She is well-developed.  HENT:     Head: Normocephalic and atraumatic.  Eyes:     Extraocular Movements: Extraocular movements intact.     Conjunctiva/sclera: Conjunctivae normal.  Cardiovascular:     Rate and Rhythm: Regular rhythm. Tachycardia present.     Heart sounds: No murmur heard. Pulmonary:     Effort: Pulmonary effort is normal. No respiratory distress.     Breath sounds: Normal breath sounds.  Abdominal:     Palpations: Abdomen is soft.     Tenderness: There is no abdominal tenderness.  Musculoskeletal:     Cervical back: Neck supple.     Left lower leg: Edema present.  Skin:    General: Skin is warm and dry.     Capillary Refill: Capillary refill takes less than 2 seconds.  Neurological:     General: No focal deficit present.     Mental Status: She is alert and oriented to person, place, and time.  Psychiatric:        Mood and Affect: Mood normal.     Procedures  .Critical Care  Performed by: Ma Rings, PA-C Authorized by: Ma Rings, PA-C   Critical care provider statement:    Critical care time (minutes):  30   Critical care was necessary to treat or prevent imminent or life-threatening deterioration of the following conditions:  Circulatory failure   Critical care was time spent personally by me on the following activities:  Development of treatment plan with patient or surrogate, discussions with consultants, evaluation of patient's response to treatment, examination of patient, ordering and review of laboratory studies, ordering and review of radiographic studies, ordering and performing treatments and interventions, pulse oximetry, re-evaluation  of patient's condition, review of old charts and obtaining history from patient or surrogate   Care discussed with: admitting provider     ED Course / MDM    Medical Decision Making Amount and/or Complexity of Data Reviewed Labs: ordered. Radiology: ordered.  Risk Prescription drug management. Decision regarding hospitalization.  Sign out taken from Sabra Heck, PA-C at shift change. Patient is here for evaluation of sore throat and lower extremity swelling.  History of DVTs, on Eliquis and has IVC filter.  Also having some shortness of breath and worsening LLE swelling.   Pending CTA of the chest, CT venogram of abdomen pelvis also in process.  Patient had a acute DVT of left leg and IVC thrombus, was transferred to [facility] and had thrombectomy performed by vascular surgery.  She was transition to Eliquis upon discharge.  She had some consolidation on her CT of her chest concerning for pneumonia and was treated for CAP.  Discharged on Augmentin.    Troponin negative, BNP is normal, chest x-ray shows residual left pleural effusion patient is anemic but around her baseline.  She had a phone call from radiologist Dr. Marilynne Halsted patient's CT angio of her chest, is positive for acute PE of the segmental and subsegmental pulmonary emboli in the right upper lobe and evidence of right heart strain consistent with at  least a submassive PE.  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  Given radiology reading of possible right heart strain I consulted with critical care and spoke with Dr. Denese Killings.  He looked at patient's imaging, thinks it is very minimal if at all right heart strain, would recommend admission to hospitalist service and confirmatory echo tomorrow, says this clot burden is relatively small would not expected this to cause heart strain. Hospitalist consulted at this time  When I evaluated patient she is noted to be tachycardic around 114 monitor, she is satting 100% but is on 4 L of oxygen.  RN is unsure what patient's sats were prior to oxygen administration.  She is unsure if sats dropped after pain medication administration and Benadryl for premedication.  This was not detailed in patient's charting nor was it signed out to me that she was on oxygen, unsure when this was started.  She reports has been compliant with her anticoagulation.  Consulted with hospitalist Dr.  Carren Rang for admission         Josem Kaufmann 09/25/23 2203    Glendora Score, MD 09/26/23 [phone]

## 2023-09-25 NOTE — ED Provider Notes (Signed)
[facility]    CSN: [id number] Arrival date & time: 09/25/23  1007      History   Chief Complaint No chief complaint on file.   HPI Jaime [name] is a 37 y.o. female.   The history is provided by the patient.   Patient presents for complaints of sore throat that is been present for the past week.  She denies fever, chills, shortness of breath, chest pain, abdominal pain, diarrhea, or constipation.  Patient reports she is currently taking Augmentin for pneumonia.  She states that she has been taking NyQuil and Chloraseptic throat spray for her symptoms with minimal relief.  Past Medical History:  Diagnosis Date   Acute encephalopathy 08/31/2020   Adjustment disorder with depressed mood 09/07/2020   Anxiety    Asthma    Chronic abdominal pain    Chronic back pain    Chronic prescription opiate use 02/16/2022   Cirrhosis (HCC)    Cirrhosis (HCC)    COPD (chronic obstructive pulmonary disease) (HCC)    Depression    Diabetes mellitus without complication (HCC)    Diabetes mellitus, type II (HCC)    Diverticulosis    DVT (deep venous thrombosis) (HCC) 02/04/2023   taking lovenox prior to procedure   Fatty liver 03/07/2020   GERD (gastroesophageal reflux disease)    Hepatitis C    Hernia, abdominal    Hyperglycemia due to type 2 diabetes mellitus (HCC) 05/24/2020   Hyperlipidemia    IBS (irritable bowel syndrome) 02/16/2022   Insomnia    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)    Lupus    Migraine headache    Morbid obesity (HCC) 05/24/2020   Neuropathy    Nocturnal seizures (HCC)    Pain of upper abdomen 07/28/2021   Peptic ulcer    Spleen enlarged    Splenomegaly     Patient Active Problem List   Diagnosis Date Noted   Elevated d-dimer 09/16/2023   Elevated brain natriuretic peptide (BNP) level 09/16/2023   Hypoalbuminemia due to protein-calorie malnutrition (HCC) 09/16/2023   Acute deep vein thrombosis (DVT) of left  lower extremity (HCC) 09/15/2023   Left leg cellulitis 09/14/2023   Implanon insertion 04/28/2023   Lumbar compression fracture (HCC) 02/10/2023   Left leg DVT (HCC) 02/05/2023   At risk for arrhythmia 01/26/2023   Chronic back pain 01/01/2023   Insomnia 01/01/2023   Depression, recurrent (HCC) 01/01/2023   Constipation 05/18/2022   IBS (irritable bowel syndrome) 02/16/2022   Chronic prescription opiate use 02/16/2022   Gastroesophageal reflux disease 10/23/2021   Diarrhea 10/23/2021   Adjustment disorder with depressed mood 09/07/2020   Dyspnea 07/29/2020   Anasarca 05/24/2020   Hyperglycemia due to type 2 diabetes mellitus (HCC) 05/24/2020   Morbid obesity (HCC) 05/24/2020   Elevated serum immunoglobulin free light chains 03/08/2020   Fatty liver 03/07/2020   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   Splenomegaly 03/07/2020   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   Pressure injury of skin 01/29/2019   Uncontrolled diabetes mellitus 09/19/2018   Hyperglycemia 09/17/2018   Acute respiratory failure with hypoxia (HCC) 09/15/2018   Anxiety 09/15/2018   Chronic abdominal pain 09/15/2018   Diabetes mellitus without complication (HCC) 09/15/2018   Hepatitis C 09/15/2018   Peptic ulcer 09/15/2018   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) 09/15/2018   Cirrhosis (HCC) 09/15/2018   Tobacco use disorder 05/22/2018   CAP (community acquired pneumonia) 07/17/2016   Chronic pain disorder 07/17/2016  Depression 05/24/2013    Past Surgical History:  Procedure Laterality Date   BIOPSY  11/25/2021   Procedure: BIOPSY;  Surgeon: Dolores Frame, MD;  Location: [facility];  Service: Gastroenterology;;   CHOLECYSTECTOMY     COLONOSCOPY WITH PROPOFOL N/A 11/25/2021   Procedure: COLONOSCOPY WITH PROPOFOL;  Surgeon: Dolores Frame, MD;  Location: [facility];  Service: Gastroenterology;  Laterality: N/A;  805   COLONOSCOPY WITH PROPOFOL N/A  03/31/2022   Procedure: COLONOSCOPY WITH PROPOFOL;  Surgeon: Dolores Frame, MD;  Location: [facility];  Service: Gastroenterology;  Laterality: N/A;  730   ESOPHAGOGASTRODUODENOSCOPY  06/2020   done at [facility], candida in upper esophagus (treated with diflucan), ulcerative esophagitis at GE junction, gastritis in stomach, single ulcer in duodenal bulb, with duodenal mucosa showing no abnormality. No presence of varices   ESOPHAGOGASTRODUODENOSCOPY (EGD) WITH PROPOFOL N/A 11/25/2021   Procedure: ESOPHAGOGASTRODUODENOSCOPY (EGD) WITH PROPOFOL;  Surgeon: Dolores Frame, MD;  Location: [facility];  Service: Gastroenterology;  Laterality: N/A;   ESOPHAGOGASTRODUODENOSCOPY (EGD) WITH PROPOFOL N/A 03/31/2022   Procedure: ESOPHAGOGASTRODUODENOSCOPY (EGD) WITH PROPOFOL;  Surgeon: Dolores Frame, MD;  Location: [facility];  Service: Gastroenterology;  Laterality: N/A;   IVC FILTER REMOVAL N/A 09/16/2023   Procedure: IVC FILTER REMOVAL;  Surgeon: [name], MD;  Location: [facility] INVASIVE CV LAB;  Service: Cardiovascular;  Laterality: N/A;   PERIPHERAL VASCULAR THROMBECTOMY Left 09/16/2023   Procedure: PERIPHERAL VASCULAR THROMBECTOMY;  Surgeon: [name], MD;  Location: [facility] INVASIVE CV LAB;  Service: Cardiovascular;  Laterality: Left;    OB History     Gravida  0   Para  0   Term  0   Preterm  0   AB  0   Living  0      SAB  0   IAB  0   Ectopic  0   Multiple  0   Live Births  0            Home Medications    Prior to Admission medications   Medication Sig Start Date End Date Taking? Authorizing Provider  albuterol (VENTOLIN HFA) 108 (90 Base) MCG/ACT inhaler Inhale 2 puffs into the lungs every 6 (six) hours as needed for wheezing or shortness of breath. 07/17/22   Leath-Warren, Sadie Haber, NP  amoxicillin-clavulanate (AUGMENTIN) 875-125 MG tablet Take 1 tablet by mouth 2 (two) times daily. 09/17/23   Burnadette Pop, MD   apixaban (ELIQUIS) 5 MG TABS tablet Take 1 tablet (5 mg total) by mouth 2 (two) times daily. 09/17/23   Burnadette Pop, MD  ARIPiprazole (ABILIFY) 20 MG tablet Take 20 mg by mouth daily. 08/02/23   [provider]  cetirizine (ZYRTEC) 10 MG tablet Take 10 mg by mouth daily.    [provider]  clonazePAM (KLONOPIN) 0.5 MG tablet Take 0.5 mg by mouth 2 (two) times daily as needed for anxiety. 08/02/23   [provider]  Continuous Glucose Transmitter (DEXCOM G6 TRANSMITTER) MISC USE AS DIRECTED. 03/24/23   Dani Gobble, NP  cyclobenzaprine (FLEXERIL) 10 MG tablet Take 1 tablet (10 mg total) by mouth 3 (three) times daily as needed for muscle spasms. Patient taking differently: Take 10 mg by mouth as needed for muscle spasms. 01/26/23   Del Nigel Berthold, FNP  enoxaparin (LOVENOX) 100 MG/ML injection Inject 1 mL (100 mg total) into the skin every 12 (twelve) hours. 09/13/23   Carnella Guadalajara, PA-C  famotidine (  PEPCID) 20 MG tablet Take 1 tablet (20 mg total) by mouth 2 (two) times daily. Patient taking differently: Take 20 mg by mouth daily. 01/07/23   Rondel Baton, MD  ferrous sulfate 325 (65 FE) MG EC tablet Take 1 tablet (325 mg total) by mouth daily with breakfast. 09/13/23   Rojelio Brenner M, PA-C  glipiZIDE (GLUCOTROL XL) 5 MG 24 hr tablet take 1 tablet by mouth daily with breakfast. 08/10/23   Dani Gobble, NP  glucose blood (ACCU-CHEK GUIDE) test strip Use as instructed to check blood glucose four times daily 12/01/21   Dani Gobble, NP  hydrOXYzine (VISTARIL) 50 MG capsule Take 1 capsule (50 mg total) by mouth 3 (three) times daily as needed. Patient taking differently: Take 50 mg by mouth 3 (three) times daily as needed for anxiety. 05/25/23   Del Newman Nip, Tenna Child, FNP  hyoscyamine (ANASPAZ) 0.125 MG TBDP disintergrating tablet TAKE 1 TABLET UNDER THE TONGUE THREE TIMES A DAY AS NEEDED FOR BLADDER SPASMS OR CRAMPING. Patient  taking differently: Place 0.125 mg under the tongue in the morning, at noon, and at bedtime. 05/10/23   Carlan, Chelsea L, NP  Insulin Pen Needle (PEN NEEDLES) 32G X 6 MM MISC 1 each by Does not apply route 3 (three) times daily. 02/26/21   Dani Gobble, NP  insulin regular human CONCENTRATED (HUMULIN R U-500 KWIKPEN) 500 UNIT/ML KwikPen Inject 90 Units into the skin 3 (three) times daily with meals. 03/24/23   Dani Gobble, NP  lidocaine (XYLOCAINE) 5 % ointment Apply 1 Application topically as needed. Patient taking differently: Apply 1 Application topically as needed for mild pain (pain score 1-3) or moderate pain (pain score 4-6). 04/27/23   Del Newman Nip, Tenna Child, FNP  LINZESS 290 MCG CAPS capsule Take 290 mcg by mouth daily. 10/16/22   [provider]  lisinopril (ZESTRIL) 5 MG tablet Take 5 mg by mouth daily.    [provider]  metFORMIN (GLUCOPHAGE) 1000 MG tablet take 1 tablet by mouth 2 times a day with a meal. Patient taking differently: Take 1,000 mg by mouth 2 (two) times daily with a meal. 08/10/23   Dani Gobble, NP  methadone (DOLOPHINE) 10 MG/ML solution Take 65 mg by mouth daily.    [provider]  methocarbamol (ROBAXIN) 500 MG tablet Take 500 mg by mouth 2 (two) times daily. 06/15/23   [provider]  omeprazole (PRILOSEC) 40 MG capsule TAKE 1 CAPSULE BY MOUTH TWICE DAILY. 05/10/23   Carlan, Chelsea L, NP  ondansetron (ZOFRAN) 4 MG tablet Take 1 tablet (4 mg total) by mouth every 8 (eight) hours as needed for nausea or vomiting. 03/08/23   Carlan, Chelsea L, NP  Oxycodone HCl 10 MG TABS Take 10 mg by mouth 4 (four) times daily as needed (pain). 09/03/23   [provider]  potassium chloride (KLOR-CON) 10 MEQ tablet Take 10 mEq by mouth 2 (two) times daily. 08/10/23   [provider]  pregabalin (LYRICA) 75 MG capsule Take 2 capsules by mouth 2 (two) times daily. 09/03/23 10/03/23  [provider]  propranolol  (INDERAL) 10 MG tablet Take 10 mg by mouth 3 (three) times daily. 08/02/23   [provider]  QUEtiapine (SEROQUEL) 25 MG tablet Take 25 mg by mouth at bedtime.    [provider]  torsemide (DEMADEX) 20 MG tablet Take 20 mg by mouth 2 (two) times daily. 09/14/23   [provider]  venlafaxine XR (  EFFEXOR XR) 75 MG 24 hr capsule Take 1 capsule (75 mg total) by mouth daily with breakfast. 05/25/23   Del Newman Nip, Tenna Child, FNP  Vitamin D, Ergocalciferol, (DRISDOL) 1.25 MG (50000 UNIT) CAPS capsule Take 1 capsule (50,000 Units total) by mouth every 7 (seven) days. Patient taking differently: Take 50,000 Units by mouth once a week. 12/10/21   Dani Gobble, NP    Family History Family History  Problem Relation Age of Onset   Hyperlipidemia Maternal Grandfather     Social History Social History   Tobacco Use   Smoking status: Every Day    Current packs/day: 0.50    Types: Cigarettes    Passive exposure: Current   Smokeless tobacco: Never  Vaping Use   Vaping status: Former   Substances: Nicotine, Flavoring  Substance Use Topics   Alcohol use: No   Drug use: No     Allergies   Iodine-131, Ivp dye [iodinated contrast media], Ketorolac tromethamine, Tylenol [acetaminophen], Vancomycin, Gabapentin, Nsaids, and Suboxone [buprenorphine hcl-naloxone hcl]   Review of Systems Review of Systems Per HPI  Physical Exam Triage Vital Signs ED Triage Vitals  Encounter Vitals Group     BP 09/25/23 1031 131/76     Systolic BP Percentile --      Diastolic BP Percentile --      Pulse Rate 09/25/23 1031 92     Resp 09/25/23 1031 18     Temp 09/25/23 1031 98.3 F (36.8 C)     Temp Source 09/25/23 1031 Oral     SpO2 09/25/23 1031 94 %     Weight --      Height --      Head Circumference --      Peak Flow --      Pain Score 09/25/23 1032 9     Pain Loc --      Pain Education --      Exclude from Growth Chart --    No data found.  Updated Vital  Signs BP 131/76 (BP Location: Right Arm)   Pulse 92   Temp 98.3 F (36.8 C) (Oral)   Resp 18   LMP  (LMP Unknown)   SpO2 94%   Visual Acuity Right Eye Distance:   Left Eye Distance:   Bilateral Distance:    Right Eye Near:   Left Eye Near:    Bilateral Near:     Physical Exam Vitals and nursing note reviewed.  Constitutional:      General: She is not in acute distress.    Appearance: Normal appearance.  HENT:     Head: Normocephalic.     Right Ear: Tympanic membrane, ear canal and external ear normal.     Left Ear: Tympanic membrane, ear canal and external ear normal.     Nose: Congestion present.     Mouth/Throat:     Mouth: Mucous membranes are moist.     Pharynx: Posterior oropharyngeal erythema present.     Comments: Cobblestoning present to posterior oropharynx  Eyes:     Extraocular Movements: Extraocular movements intact.     Conjunctiva/sclera: Conjunctivae normal.     Pupils: Pupils are equal, round, and reactive to light.  Cardiovascular:     Rate and Rhythm: Normal rate and regular rhythm.     Pulses: Normal pulses.     Heart sounds: Normal heart sounds.  Pulmonary:     Effort: Pulmonary effort is normal. No respiratory distress.     Breath sounds: Normal  breath sounds. No stridor. No wheezing, rhonchi or rales.  Abdominal:     General: Bowel sounds are normal.     Palpations: Abdomen is soft.     Tenderness: There is no abdominal tenderness.  Musculoskeletal:     Cervical back: Normal range of motion.  Lymphadenopathy:     Cervical: No cervical adenopathy.  Skin:    General: Skin is warm and dry.  Neurological:     General: No focal deficit present.     Mental Status: She is alert and oriented to person, place, and time.  Psychiatric:        Mood and Affect: Mood normal.        Behavior: Behavior normal.      UC Treatments / Results  Labs (all labs ordered are listed, but only abnormal results are displayed) Labs Reviewed - No data to  display  EKG   Radiology No results found.  Procedures Procedures (including critical care time)  Medications Ordered in UC Medications - No data to display  Initial Impression / Assessment and Plan / UC Course  I have reviewed the triage vital signs and the nursing notes.  Pertinent labs & imaging results that were available during my care of the patient were reviewed by me and considered in my medical decision making (see chart for details).  Patient currently taking Augmentin for pneumonia, will defer strep test at this time.  Magic mouthwash prescribed for patient to gargle, swish, and swallow for throat pain or discomfort.  Supportive care recommendations were provided and discussed with the patient to include fluids, rest, over-the-counter Tylenol, warm salt water gargles, and a soft diet.  Patient was advised to follow-up with her PCP if symptoms fail to improve with this treatment.  Patient was in agreement with this plan of care and verbalized understanding.  All questions were answered.  Patient stable for discharge.  Final Clinical Impressions(s) / UC Diagnoses   Final diagnoses:  None   Discharge Instructions   None    ED Prescriptions   None    PDMP not reviewed this encounter.   Abran Cantor, NP 09/25/23 1043

## 2023-09-25 NOTE — ED Notes (Signed)
Pt resting. Nad. See triage notes. Lle red, swelling and warm to touch. Pt came from UC due to sore throat/congestion and cough as well.

## 2023-09-25 NOTE — ED Notes (Signed)
Pt provided water.

## 2023-09-25 NOTE — Discharge Instructions (Addendum)
Continue your current medications. Take medication as prescribed. May take over-the-counter Tylenol as needed for pain, fever, general discomfort. Warm salt water gargles 3-4 times daily as needed for throat pain or discomfort. Recommend a soft diet such as soup, broth, yogurt, pudding, or Jell-O while symptoms persist. If symptoms do not improve over the next 5 to 7 days, please follow-up with your primary care physician for further evaluation. Follow-up as needed.

## 2023-09-25 NOTE — ED Notes (Signed)
Patient performed peak flow test x3. She was able to get to 200. She did not have great effort.

## 2023-09-25 NOTE — ED Notes (Signed)
Pt taken to the unit by RN on the monitor.

## 2023-09-25 NOTE — ED Triage Notes (Signed)
Sore throat x 1 week.  States she has nausea, vomiting and diarrhea x 1 week.   Patient currently on Augmentin for pneumonia

## 2023-09-25 NOTE — ED Notes (Signed)
ED TO INPATIENT HANDOFF REPORT  ED Nurse Name and Phone #:  Leanord Hawking, RN  S Name/Age/Gender Jaime [name] 37 y.o. female Room/Bed: APA16A/APA16A  Code Status   Code Status: Prior  Home/SNF/Other Home Patient oriented to: self, place, time, and situation Is this baseline? Yes   Triage Complete: Triage complete  Chief Complaint Acute pulmonary embolism (HCC) [I26.99]  Triage Note Dx with DVT to LT lower leg.  Reports more pain than usual, increased swelling and nausea for the past couple of days.  Currently on eliquis.    Allergies Allergies  Allergen Reactions   Iodine-131 Anaphylaxis, Shortness Of Breath and Swelling   Ivp Dye [Iodinated Contrast Media] Anaphylaxis    Skin gets very red, unable to walk    Ketorolac Tromethamine Shortness Of Breath   Tylenol [Acetaminophen] Other (See Comments)    Due to cirrhosis    Vancomycin Swelling    Facial swelling,    Gabapentin Itching   Nsaids Other (See Comments)    Flares ulcers   Suboxone [Buprenorphine Hcl-Naloxone Hcl] Other (See Comments)    Withdrawal symptoms     Level of Care/Admitting Diagnosis ED Disposition     ED Disposition  Admit   Condition  --   Comment  Hospital Area: [facility] [[id number]]  Level of Care: Telemetry [5]  Covid Evaluation: Asymptomatic - no recent exposure (last 10 days) testing not required  Diagnosis: Acute pulmonary embolism [facility]) [[id number]]  Admitting Physician: Lilyan Gilford [[id number]]  Attending Physician: Lilyan Gilford [[id number]]  Certification:: I certify this patient will need inpatient services for at least 2 midnights  Expected Medical Readiness: 09/27/2023          B Medical/Surgery History Past Medical History:  Diagnosis Date   Acute encephalopathy 08/31/2020   Adjustment disorder with depressed mood 09/07/2020   Anxiety    Asthma    Chronic abdominal pain    Chronic back pain    Chronic prescription opiate use 02/16/2022    Cirrhosis (HCC)    Cirrhosis (HCC)    COPD (chronic obstructive pulmonary disease) (HCC)    Depression    Diabetes mellitus without complication (HCC)    Diabetes mellitus, type II (HCC)    Diverticulosis    DVT (deep venous thrombosis) (HCC) 02/04/2023   taking lovenox prior to procedure   Fatty liver 03/07/2020   GERD (gastroesophageal reflux disease)    Hepatitis C    Hernia, abdominal    Hyperglycemia due to type 2 diabetes mellitus (HCC) 05/24/2020   Hyperlipidemia    IBS (irritable bowel syndrome) 02/16/2022   Insomnia    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)    Lupus    Migraine headache    Morbid obesity (HCC) 05/24/2020   Neuropathy    Nocturnal seizures (HCC)    Pain of upper abdomen 07/28/2021   Peptic ulcer    Spleen enlarged    Splenomegaly    Past Surgical History:  Procedure Laterality Date   BIOPSY  11/25/2021   Procedure: BIOPSY;  Surgeon: Dolores Frame, MD;  [facility];  Service: Gastroenterology;;   CHOLECYSTECTOMY     COLONOSCOPY WITH PROPOFOL N/A 11/25/2021   Procedure: COLONOSCOPY WITH PROPOFOL;  Surgeon: Dolores Frame, MD;  [facility];  Service: Gastroenterology;  Laterality: N/A;  805   COLONOSCOPY WITH PROPOFOL N/A 03/31/2022   Procedure: COLONOSCOPY WITH PROPOFOL;  Surgeon: Dolores Frame, MD;  [facility];  Service: Gastroenterology;  Laterality: N/A;  730   ESOPHAGOGASTRODUODENOSCOPY  06/2020   done at [facility], candida in upper esophagus (treated with diflucan), ulcerative esophagitis at GE junction, gastritis in stomach, single ulcer in duodenal bulb, with duodenal mucosa showing no abnormality. No presence of varices   ESOPHAGOGASTRODUODENOSCOPY (EGD) WITH PROPOFOL N/A 11/25/2021   Procedure: ESOPHAGOGASTRODUODENOSCOPY (EGD) WITH PROPOFOL;  Surgeon: Dolores Frame, MD;  [facility];  Service: Gastroenterology;  Laterality: N/A;    ESOPHAGOGASTRODUODENOSCOPY (EGD) WITH PROPOFOL N/A 03/31/2022   Procedure: ESOPHAGOGASTRODUODENOSCOPY (EGD) WITH PROPOFOL;  Surgeon: Dolores Frame, MD;  [facility];  Service: Gastroenterology;  Laterality: N/A;   IVC FILTER REMOVAL N/A 09/16/2023   Procedure: IVC FILTER REMOVAL;  Surgeon: [name], MD;  Location: [facility] INVASIVE CV LAB;  Service: Cardiovascular;  Laterality: N/A;   PERIPHERAL VASCULAR THROMBECTOMY Left 09/16/2023   Procedure: PERIPHERAL VASCULAR THROMBECTOMY;  Surgeon: [name], MD;  Location: [facility] INVASIVE CV LAB;  Service: Cardiovascular;  Laterality: Left;     A IV Location/Drains/Wounds Patient Lines/Drains/Airways Status     Active Line/Drains/Airways     Name Placement date Placement time Site Days   Peripheral IV 09/25/23 20 G 1.88" Right;Medial;Upper Arm 09/25/23  1547  Arm  less than 1   Pressure Injury Deep Tissue Injury - Purple or maroon localized area of discolored intact skin or blood-filled blister due to damage of underlying soft tissue from pressure and/or shear. --  --  -- --   Pressure Injury 08/31/20 Sacrum Right;Left Stage 1 -  Intact skin with non-blanchable redness of a localized area usually over a bony prominence. 08/31/20  1840  -- 1120   Wound / Incision (Open or Dehisced) 05/19/21 Other (Comment) Buttocks Left;Upper 05/19/21  0544  Buttocks  859            Intake/Output Last 24 hours No intake or output data in the 24 hours ending 09/25/23 2210  Labs/Imaging Results for orders placed or performed during the hospital encounter of 09/25/23 (from the past 48 hour(s))  CBC with Differential     Status: Abnormal   Collection Time: 09/25/23  1:30 PM  Result Value Ref Range   WBC 5.3 4.0 - 10.5 K/uL   RBC 3.58 (L) 3.87 - 5.11 MIL/uL   Hemoglobin 10.4 (L) 12.0 - 15.0 g/dL   HCT 91.4 (L) 78.2 - 95.6 %   MCV 91.1 80.0 - 100.0 fL   MCH 29.1 26.0 - 34.0 pg   MCHC 31.9 30.0 - 36.0 g/dL   RDW 21.3 (H) 08.6 -  15.5 %   Platelets 116 (L) 150 - 400 K/uL   nRBC 0.0 0.0 - 0.2 %   Neutrophils Relative % 72 %   Neutro Abs 3.8 1.7 - 7.7 K/uL   Lymphocytes Relative 15 %   Lymphs Abs 0.8 0.7 - 4.0 K/uL   Monocytes Relative 11 %   Monocytes Absolute 0.6 0.1 - 1.0 K/uL   Eosinophils Relative 2 %   Eosinophils Absolute 0.1 0.0 - 0.5 K/uL   Basophils Relative 0 %   Basophils Absolute 0.0 0.0 - 0.1 K/uL   Immature Granulocytes 0 %   Abs Immature Granulocytes 0.02 0.00 - 0.07 K/uL    Comment: Performed at [facility], [address] [place], [place] [address]  Comprehensive metabolic panel     Status: Abnormal   Collection Time: 09/25/23  1:30 PM  Result Value Ref Range   Sodium 136 135 - 145  mmol/L   Potassium 3.5 3.5 - 5.1 mmol/L   Chloride 106 98 - 111 mmol/L   CO2 23 22 - 32 mmol/L   Glucose, Bld 240 (H) 70 - 99 mg/dL    Comment: Glucose reference range applies only to samples taken after fasting for at least 8 hours.   BUN 6 6 - 20 mg/dL   Creatinine, Ser 5.73 (L) 0.44 - 1.00 mg/dL   Calcium 7.9 (L) 8.9 - 10.3 mg/dL   Total Protein 5.9 (L) 6.5 - 8.1 g/dL   Albumin 2.5 (L) 3.5 - 5.0 g/dL   AST 19 15 - 41 U/L   ALT 19 0 - 44 U/L   Alkaline Phosphatase 152 (H) 38 - 126 U/L   Total Bilirubin 1.0 <1.2 mg/dL   GFR, Estimated >22 >02 mL/min    Comment: (NOTE) Calculated using the CKD-EPI Creatinine Equation (2021)    Anion gap 7 5 - 15    Comment: Performed at [facility], [address] [place], [place] [address]  Brain natriuretic peptide     Status: None   Collection Time: 09/25/23  1:30 PM  Result Value Ref Range   B Natriuretic Peptide 39.0 0.0 - 100.0 pg/mL    Comment: Performed at [facility], [address], [place], [place] [address]  Troponin I (High Sensitivity)     Status: None   Collection Time: 09/25/23  1:30 PM  Result Value Ref Range   Troponin I (High Sensitivity) 3 <18 ng/L    Comment: (NOTE) Elevated high sensitivity troponin I (hsTnI) values and significant  changes  across serial measurements may suggest ACS but many other  chronic and acute conditions are known to elevate hsTnI results.  Refer to the "Links" section for chest pain algorithms and additional  guidance. Performed at [facility], [address], [place], [place] [address]   hCG, serum, qualitative     Status: None   Collection Time: 09/25/23  1:30 PM  Result Value Ref Range   Preg, Serum NEGATIVE NEGATIVE    Comment:        THE SENSITIVITY OF THIS METHODOLOGY IS >10 mIU/mL. Performed at [facility], [address], [place], [place] [address]   D-dimer, quantitative     Status: Abnormal   Collection Time: 09/25/23  1:47 PM  Result Value Ref Range   D-Dimer, Quant 4.34 (H) 0.00 - 0.50 ug/mL-FEU    Comment: (NOTE) At the manufacturer cut-off value of 0.5 g/mL FEU, this assay has a negative predictive value of 95-100%.This assay is intended for use in conjunction with a clinical pretest probability (PTP) assessment model to exclude pulmonary embolism (PE) and deep venous thrombosis (DVT) in outpatients suspected of PE or DVT. Results should be correlated with clinical presentation. Performed at [facility], [address] [place], [place] [address]   Resp panel by RT-PCR (RSV, Flu A&B, Covid) Anterior Nasal Swab     Status: None   Collection Time: 09/25/23  2:16 PM   Specimen: Anterior Nasal Swab  Result Value Ref Range   SARS Coronavirus 2 by RT PCR NEGATIVE NEGATIVE    Comment: (NOTE) SARS-CoV-2 target nucleic acids are NOT DETECTED.  The SARS-CoV-2 RNA is generally detectable in upper respiratory specimens during the acute phase of infection. The lowest concentration of SARS-CoV-2 viral copies this assay can detect is 138 copies/mL. A negative result does not preclude SARS-Cov-2 infection and should not be used as the sole basis for treatment or other patient management decisions.  A negative result may occur with  improper specimen collection/handling, submission of  specimen other than nasopharyngeal swab, presence of viral mutation(s) within the areas targeted by this assay, and inadequate number of viral copies(<138 copies/mL). A negative result must be combined with clinical observations, patient history, and epidemiological information. The expected result is Negative.  Fact Sheet for Patients:  BloggerCourse.com  Fact Sheet for Healthcare Providers:  SeriousBroker.it  This test is no t yet approved or cleared by the [place] FDA and  has been authorized for detection and/or diagnosis of SARS-CoV-2 by FDA under an Emergency Use Authorization (EUA). This EUA will remain  in effect (meaning this test can be used) for the duration of the COVID-19 declaration under Section 564(b)(1) of the Act, 21 U.S.C.section 360bbb-3(b)(1), unless the authorization is terminated  or revoked sooner.       Influenza A by PCR NEGATIVE NEGATIVE   Influenza B by PCR NEGATIVE NEGATIVE    Comment: (NOTE) The Xpert Xpress SARS-CoV-2/FLU/RSV plus assay is intended as an aid in the diagnosis of influenza from Nasopharyngeal swab specimens and should not be used as a sole basis for treatment. Nasal washings and aspirates are unacceptable for Xpert Xpress SARS-CoV-2/FLU/RSV testing.  Fact Sheet for Patients: BloggerCourse.com  Fact Sheet for Healthcare Providers: SeriousBroker.it  This test is not yet approved or cleared by the [place] FDA and has been authorized for detection and/or diagnosis of SARS-CoV-2 by FDA under an Emergency Use Authorization (EUA). This EUA will remain in effect (meaning this test can be used) for the duration of the COVID-19 declaration under Section 564(b)(1) of the Act, 21 U.S.C. section 360bbb-3(b)(1), unless the authorization is terminated or revoked.     Resp Syncytial Virus by PCR NEGATIVE NEGATIVE    Comment:  (NOTE) Fact Sheet for Patients: BloggerCourse.com  Fact Sheet for Healthcare Providers: SeriousBroker.it  This test is not yet approved or cleared by the [place] FDA and has been authorized for detection and/or diagnosis of SARS-CoV-2 by FDA under an Emergency Use Authorization (EUA). This EUA will remain in effect (meaning this test can be used) for the duration of the COVID-19 declaration under Section 564(b)(1) of the Act, 21 U.S.C. section 360bbb-3(b)(1), unless the authorization is terminated or revoked.  Performed at [facility], [address] [place], [place] [address]   Troponin I (High Sensitivity)     Status: None   Collection Time: 09/25/23  3:54 PM  Result Value Ref Range   Troponin I (High Sensitivity) 2 <18 ng/L    Comment: (NOTE) Elevated high sensitivity troponin I (hsTnI) values and significant  changes across serial measurements may suggest ACS but many other  chronic and acute conditions are known to elevate hsTnI results.  Refer to the "Links" section for chest pain algorithms and additional  guidance. Performed at [facility], [address] [place], [place] [address]   Basic metabolic panel     Status: Abnormal   Collection Time: 09/25/23  9:13 PM  Result Value Ref Range   Sodium 134 (L) 135 - 145 mmol/L   Potassium 4.3 3.5 - 5.1 mmol/L   Chloride 104 98 - 111 mmol/L   CO2 24 22 - 32 mmol/L   Glucose, Bld 306 (H) 70 - 99 mg/dL    Comment: Glucose reference range applies only to samples taken after fasting for at least 8 hours.   BUN 7 6 - 20 mg/dL   Creatinine, Ser 0.10 0.44 - 1.00 mg/dL   Calcium  8.0 (L) 8.9 - 10.3 mg/dL   GFR, Estimated >86 >57 mL/min    Comment: (NOTE) Calculated using the CKD-EPI Creatinine Equation (2021)    Anion gap 6 5 - 15    Comment: Performed at [facility], [address], [place], [place] [address]  Lactic acid, plasma     Status: None   Collection Time:  09/25/23  9:13 PM  Result Value Ref Range   Lactic Acid, Venous 1.4 0.5 - 1.9 mmol/L    Comment: Performed at [facility], [address], [place], [place] [address]  Lipase, blood     Status: None   Collection Time: 09/25/23  9:13 PM  Result Value Ref Range   Lipase 20 11 - 51 U/L    Comment: Performed at [facility], [address] [place], [place] [address]  APTT     Status: None   Collection Time: 09/25/23  9:13 PM  Result Value Ref Range   aPTT 27 24 - 36 seconds    Comment: Performed at [facility], [address] [place], [place] [address]  Protime-INR     Status: None   Collection Time: 09/25/23  9:13 PM  Result Value Ref Range   Prothrombin Time 14.3 11.4 - 15.2 seconds   INR 1.1 0.8 - 1.2    Comment: (NOTE) INR goal varies based on device and disease states. Performed at [facility], [address] [place], [place] [address]   Heparin level (unfractionated)     Status: Abnormal   Collection Time: 09/25/23  9:13 PM  Result Value Ref Range   Heparin Unfractionated <0.10 (L) 0.30 - 0.70 IU/mL    Comment: (NOTE) The clinical reportable range upper limit is being lowered to >1.10 to align with the FDA approved guidance for the current laboratory assay.  If heparin results are below expected values, and patient dosage has  been confirmed, suggest follow up testing of antithrombin III levels. Performed at [facility], [address] [place], [place] [address]    CT VENOGRAM ABD/PEL  Result Date: 09/25/2023 CLINICAL DATA:  Caval thrombus and left lower extremity DVT. Follow-up examination EXAM: CT VENOGRAM ABDOMEN AND PELVIS TECHNIQUE: Venographic phase images of the abdomen and pelvis were obtained following the administration of intravenous contrast. Multiplanar reformats and maximum intensity projections were generated. RADIATION DOSE REDUCTION: This exam was performed according to the departmental dose-optimization program which includes automated exposure control,  adjustment of the mA and/or kV according to patient size and/or use of iterative reconstruction technique. CONTRAST:  OMNIPAQUE IOHEXOL 350 MG/ML SOLN COMPARISON:  09/15/2023 FINDINGS: Small left pleural effusion. Stable left basilar collapse and consolidation. Cardiac size within normal limits. Small hiatal hernia. Gastroesophageal varices noted. Infiltrative changes surrounding the uncinate process of the pancreas extending into the mesenteric root appears slightly progressive since prior examination and may represent retroperitoneal edema or a superimposed inflammatory process suggest acute interstitial/edematous pancreatitis. Fatty atrophy of the pancreas again noted. Otherwise normal enhancement of the pancreatic parenchyma. The pancreatic duct is not dilated. No loculated peripancreatic fluid collections are identified. 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 Approximately 2/3 of the splenic parenchyma appears devascularized. Stable splenomegaly. The adrenal glands are unremarkable. The kidneys are normal. Bladder is unremarkable. Progressive infiltrative changes surround the third and fourth portion the duodenum which may relate to retroperitoneal edema or inflammatory process following the uncinate process of the pancreas. Moderate ascites again noted, stable since prior examination.  The stomach, small bowel, and large bowel are otherwise unremarkable. The appendix is not clearly identified, however, no secondary signs of appendicitis are seen within the right lower quadrant. Inferior vena cava filter is in place within the infrarenal cava, unchanged from prior examination. Previously noted caval thrombus has resolved. 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 Previously noted  superimposed acute intraluminal thrombus, however, has resolved and the left common femoral, external iliac, and common iliac veins appear patent with inline flow. Small residual filling defect identified within the visualized left femoral vein in keeping with DVT in this location. Pelvic organs are unremarkable. Diffuse subcutaneous body wall edema has progressed slightly in the interval since prior examination. Asymmetric circumferential subcutaneous edema within the left lower extremity is again identified likely related to impaired venous return given left femoral DVT and left external iliac venous stenosis. No acute bone abnormality.  No lytic or blastic bone lesion. IMPRESSION: 1. Interval resolution of previously noted caval thrombus. 2. Interval resolution of previously noted acute intraluminal thrombus within the left common femoral, external iliac, and common iliac veins. Inline flow reestablished though residual high-grade stenosis of the left external iliac vein, related to remote thrombosis, again noted. The 3. Small residual filling defect within the visualized left femoral vein in keeping with DVT in this location. 4. Infiltrative changes surrounding the uncinate process of the pancreas extending into the mesenteric root appears slightly progressive since prior examination and may represent retroperitoneal edema or a superimposed inflammatory process suggest acute interstitial/edematous pancreatitis. Correlation with serum lipase would be helpful in differentiating these entities. 5. Stable small left pleural effusion. Stable left basilar collapse and consolidation. 6. Stable moderate ascites. 7. Stable splenomegaly. 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 8. Diffuse subcutaneous body wall edema has progressed slightly in the interval since prior examination. Asymmetric circumferential subcutaneous edema within the left lower extremity is again  identified likely related to impaired venous return given left femoral DVT and left external iliac venous stenosis. Electronically Signed   By: Helyn Numbers M.D.   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 Now presenting with left leg swelling, warmth, pain, and shortness of breath. PE suspected. EXAM: CT ANGIOGRAPHY CHEST WITH CONTRAST TECHNIQUE: Multidetector CT imaging of the chest was performed using the standard protocol during bolus administration of intravenous contrast. Multiplanar CT image reconstructions and MIPs were obtained to evaluate the vascular anatomy. RADIATION DOSE REDUCTION: This exam was performed according to the departmental dose-optimization program which includes automated exposure control, adjustment of the mA and/or kV according to patient size and/or use of iterative reconstruction technique. CONTRAST:  OMNIPAQUE IOHEXOL 350 MG/ML SOLN COMPARISON:  None Available. FINDINGS: Cardiovascular: Acute segmental and subsegmental pulmonary emboli in the right upper lobe (series 4/image 105). There is evidence of right heart strain with RV: LV ratio of 1.2. There is mild flattening of the interventricular septum. No pericardial effusion. Mediastinum/Nodes: Mild wall thickening of the distal esophagus and paraesophageal varices. Trachea is unremarkable. No thoracic adenopathy. Lungs/Pleura: Consolidation in the left lower lobe may be due to atelectasis or pneumonia and is similar to prior. Small left pleural effusion. No pneumothorax. Upper Abdomen: Nodular contour of the liver compatible  with cirrhosis. Cholecystectomy. Upper abdominal ascites. Heterogenous enhancement of the spleen which is enlarged. Musculoskeletal: No acute fracture. Review of the MIP images confirms the above findings. IMPRESSION: 1. Acute  segmental and subsegmental pulmonary emboli in the right upper lobe. Positive for acute PE with CT evidence of right heart strain (RV/LV Ratio = 1.2) consistent with at least submassive (intermediate risk) PE. The presence of right heart strain has been associated with an increased risk of morbidity and mortality. Please refer to the "Code PE Focused" order set in EPIC. 2. Consolidation in the left lower lobe may be due to atelectasis or pneumonia. Small left pleural effusion. 3. Cirrhosis with upper abdominal ascites. See separate report for findings in the abdomen. 4. Mild wall thickening of the distal esophagus may be due to venous congestion or esophagitis. Paraesophageal varices. Critical Value/emergent results were called by telephone at the time of interpretation on 09/25/2023 at 8:36 pm to provider PA [facility], who verbally acknowledged these results. Electronically Signed   By: [name] Fester M.D.   On: 09/25/2023 20:39   DG Chest Port 1 View  Result Date: 09/25/2023 CLINICAL DATA:  dyspnea EXAM: PORTABLE CHEST 1 VIEW COMPARISON:  September 14, 2023 FINDINGS: The cardiomediastinal silhouette is unchanged in contour. Trace residual LEFT pleural effusion. No pneumothorax. Favored overall improved aeration at the LEFT lung base with minimal linear opacity noted. IMPRESSION: Trace residual LEFT pleural effusion. No pneumothorax. Electronically Signed   By: Meda Klinefelter M.D.   On: 09/25/2023 13:46    Pending Labs Unresulted Labs (From admission, onward)     Start     Ordered   09/26/23 0500  Heparin level (unfractionated)  Tomorrow morning,   R        09/25/23 2107   09/26/23 0500  CBC  Tomorrow morning,   R        09/25/23 2107   09/26/23 0300  APTT  Once-Timed,   STAT        09/25/23 2107            Vitals/Pain Today's Vitals   09/25/23 2027 09/25/23 2115 09/25/23 2200 09/25/23 2203  BP:  122/77 130/70   Pulse:  (!) 112 (!) 105   Resp:  11 (!) 23   Temp: 98.8 F (37.1  C)     TempSrc: Oral     SpO2:  95% 96%   Weight:      Height:      PainSc:    5     Isolation Precautions No active isolations  Medications Medications  heparin ADULT infusion 100 units/mL (25000 units/233mL) (1,450 Units/hr Intravenous New Bag/Given 09/25/23 2129)  ondansetron (ZOFRAN) injection 4 mg (4 mg Intravenous Given 09/25/23 1558)  methylPREDNISolone sodium succinate (SOLU-MEDROL) 40 mg/mL injection 40 mg (40 mg Intravenous Given 09/25/23 1559)  diphenhydrAMINE (BENADRYL) injection 50 mg (50 mg Intravenous Given 09/25/23 1557)  HYDROmorphone (DILAUDID) injection 1 mg (1 mg Intravenous Given 09/25/23 1600)  HYDROmorphone (DILAUDID) injection 0.5 mg (0.5 mg Intravenous Given 09/25/23 1659)  diphenhydrAMINE (BENADRYL) capsule 50 mg (50 mg Oral Given 09/25/23 1902)  iohexol (OMNIPAQUE) 350 MG/ML injection 100 mL (100 mLs Intravenous Contrast Given 09/25/23 2001)  heparin bolus via infusion 5,500 Units (5,500 Units Intravenous Bolus from Bag 09/25/23 2130)  ondansetron (ZOFRAN) injection 4 mg (4 mg Intravenous Given 09/25/23 2125)  HYDROmorphone (DILAUDID) injection 0.5 mg (0.5 mg Intravenous Given 09/25/23 2126)    Mobility walks     Focused Assessments Pulmonary Assessment Handoff:  Lung sounds:   O2 Device: Nasal Cannula O2 Flow Rate (L/min): 2 L/min    R Recommendations: See Admitting Provider Note  Report given to:   Additional Notes:  N/A

## 2023-09-25 NOTE — ED Notes (Signed)
Pt used bedpan, 500 ml out

## 2023-09-25 NOTE — ED Notes (Signed)
Pt to CT

## 2023-09-25 NOTE — Consult Note (Signed)
PHARMACY - ANTICOAGULATION CONSULT NOTE  Pharmacy Consult for Heparin Infusion Indication: pulmonary embolus  Allergies  Allergen Reactions   Iodine-131 Anaphylaxis, Shortness Of Breath and Swelling   Ivp Dye [Iodinated Contrast Media] Anaphylaxis    Skin gets very red, unable to walk    Ketorolac Tromethamine Shortness Of Breath   Tylenol [Acetaminophen] Other (See Comments)    Due to cirrhosis    Vancomycin Swelling    Facial swelling,    Gabapentin Itching   Nsaids Other (See Comments)    Flares ulcers   Suboxone [Buprenorphine Hcl-Naloxone Hcl] Other (See Comments)    Withdrawal symptoms     Patient Measurements: Height: 5\' 9"  (175.3 cm) Weight: 113.4 kg (250 lb) IBW/kg (Calculated) : 66.2 Heparin Dosing Weight: 91.9 kg  Vital Signs: Temp: 98.8 F (37.1 C) (11/23 2027) Temp Source: Oral (11/23 2027) BP: 120/71 (11/23 1945) Pulse Rate: 102 (11/23 1945)  Labs: Recent Labs    09/25/23 1330 09/25/23 1554  HGB 10.4*  --   HCT 32.6*  --   PLT 116*  --   CREATININE 0.39*  --   TROPONINIHS 3 2    Estimated Creatinine Clearance: 126.8 mL/min (A) (by C-G formula based on SCr of 0.39 mg/dL (L)).   Medical History: Past Medical History:  Diagnosis Date   Acute encephalopathy 08/31/2020   Adjustment disorder with depressed mood 09/07/2020   Anxiety    Asthma    Chronic abdominal pain    Chronic back pain    Chronic prescription opiate use 02/16/2022   Cirrhosis (HCC)    Cirrhosis (HCC)    COPD (chronic obstructive pulmonary disease) (HCC)    Depression    Diabetes mellitus without complication (HCC)    Diabetes mellitus, type II (HCC)    Diverticulosis    DVT (deep venous thrombosis) (HCC) 02/04/2023   taking lovenox prior to procedure   Fatty liver 03/07/2020   GERD (gastroesophageal reflux disease)    Hepatitis C    Hernia, abdominal    Hyperglycemia due to type 2 diabetes mellitus (HCC) 05/24/2020   Hyperlipidemia    IBS (irritable bowel  syndrome) 02/16/2022   Insomnia    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)    Lupus    Migraine headache    Morbid obesity (HCC) 05/24/2020   Neuropathy    Nocturnal seizures (HCC)    Pain of upper abdomen 07/28/2021   Peptic ulcer    Spleen enlarged    Splenomegaly    Assessment: Jaime [name] is a 37 y.o. female presenting with SOB. 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 Patient was on [facility] PTA per chart review. Last dose of apixaban unknown. Pharmacy has been consulted to initiate and manage heparin infusion.   Baseline Labs: aPTT 27, HL <0.10, PT 14.3, INR 1.1, Hgb 10.4, Hct 32.6, Plt 116   Goal of Therapy:  Heparin level 0.3-0.7 units/ml aPTT 66-102 seconds Monitor platelets by anticoagulation protocol: Yes   Plan:  Give 5500 units bolus x 1 Start heparin infusion at 1450 units/hr Check aPTT in 6 hours Continue to monitor aPTT until HL and aPTT correlate.  Check HL daily for correlation until HL and aPTT correlate. Switch to HL monitoring once HL and aPTT correlate.  Continue to monitor H&H and platelets daily while on heparin infusion    Celene Squibb, PharmD Clinical Pharmacist 09/25/2023 9:04 PM

## 2023-09-25 NOTE — ED Provider Notes (Signed)
[facility] AT [facility] Provider Note   CSN: [id number] Arrival date & time: 09/25/23  1233     History  Chief Complaint  Patient presents with   Leg Pain    KEYONI LINDENMEYER is a 37 y.o. female with PMHx of thrombocytopenia, anemia, hypersplenism, diabetes type 2, GERD, tobacco use, diastolic HF, chronic DVT (IVF and AC with Eliquis), s/p mechanical thrombectomy (on 09/16/23) presents to emergency department for evaluation of left leg swelling, warmth, pain and SOB that has been worsening since admission discharge for DVT on 09/17/2023.   She complains of shortness of breath at rest that worsens with exertion. She is currently being treated for possible pneumonia with Augmentin since discharge. She reports that she has gained 15 pounds over the past month but takes Torsemide 40 mg daily.   She also complains of N/V with 4 episodes of vomiting this week.   She was evaluated at [facility] today for sore throat that has been occurring for past week. She was provided supportive care and recommended outpatient follow up. According to their note, she did not complain of LLE edema, SOB.   Leg Pain Associated symptoms: no back pain and no fever       Home Medications Prior to Admission medications   Medication Sig Start Date End Date Taking? Authorizing Provider  albuterol (VENTOLIN HFA) 108 (90 Base) MCG/ACT inhaler Inhale 2 puffs into the lungs every 6 (six) hours as needed for wheezing or shortness of breath. 07/17/22   Leath-Warren, Sadie Haber, NP  amoxicillin-clavulanate (AUGMENTIN) 875-125 MG tablet Take 1 tablet by mouth 2 (two) times daily. 09/17/23   Burnadette Pop, MD  apixaban (ELIQUIS) 5 MG TABS tablet Take 1 tablet (5 mg total) by mouth 2 (two) times daily. 09/17/23   Burnadette Pop, MD  ARIPiprazole (ABILIFY) 20 MG tablet Take 20 mg by mouth daily. 08/02/23   [provider]  cetirizine (ZYRTEC) 10 MG tablet Take 10 mg by mouth daily.     [provider]  clonazePAM (KLONOPIN) 0.5 MG tablet Take 0.5 mg by mouth 2 (two) times daily as needed for anxiety. 08/02/23   [provider]  Continuous Glucose Transmitter (DEXCOM G6 TRANSMITTER) MISC USE AS DIRECTED. 03/24/23   Dani Gobble, NP  cyclobenzaprine (FLEXERIL) 10 MG tablet Take 1 tablet (10 mg total) by mouth 3 (three) times daily as needed for muscle spasms. Patient taking differently: Take 10 mg by mouth as needed for muscle spasms. 01/26/23   Del Nigel Berthold, FNP  enoxaparin (LOVENOX) 100 MG/ML injection Inject 1 mL (100 mg total) into the skin every 12 (twelve) hours. 09/13/23   Carnella Guadalajara, PA-C  famotidine (PEPCID) 20 MG tablet Take 1 tablet (20 mg total) by mouth 2 (two) times daily. Patient taking differently: Take 20 mg by mouth daily. 01/07/23   Rondel Baton, MD  ferrous sulfate 325 (65 FE) MG EC tablet Take 1 tablet (325 mg total) by mouth daily with breakfast. 09/13/23   Rojelio Brenner M, PA-C  glipiZIDE (GLUCOTROL XL) 5 MG 24 hr tablet take 1 tablet by mouth daily with breakfast. 08/10/23   Dani Gobble, NP  glucose blood (ACCU-CHEK GUIDE) test strip Use as instructed to check blood glucose four times daily 12/01/21   Dani Gobble, NP  hydrOXYzine (VISTARIL) 50 MG capsule Take 1 capsule (50 mg total) by mouth 3 (three) times daily as needed. Patient taking differently: Take 50 mg by mouth 3 (  three) times daily as needed for anxiety. 05/25/23   Del Newman Nip, Tenna Child, FNP  hyoscyamine (ANASPAZ) 0.125 MG TBDP disintergrating tablet TAKE 1 TABLET UNDER THE TONGUE THREE TIMES A DAY AS NEEDED FOR BLADDER SPASMS OR CRAMPING. Patient taking differently: Place 0.125 mg under the tongue in the morning, at noon, and at bedtime. 05/10/23   Carlan, Chelsea L, NP  Insulin Pen Needle (PEN NEEDLES) 32G X 6 MM MISC 1 each by Does not apply route 3 (three) times daily. 02/26/21   Dani Gobble, NP  insulin regular human  CONCENTRATED (HUMULIN R U-500 KWIKPEN) 500 UNIT/ML KwikPen Inject 90 Units into the skin 3 (three) times daily with meals. 03/24/23   Dani Gobble, NP  lidocaine (XYLOCAINE) 5 % ointment Apply 1 Application topically as needed. Patient taking differently: Apply 1 Application topically as needed for mild pain (pain score 1-3) or moderate pain (pain score 4-6). 04/27/23   Del Newman Nip, Tenna Child, FNP  LINZESS 290 MCG CAPS capsule Take 290 mcg by mouth daily. 10/16/22   [provider]  lisinopril (ZESTRIL) 5 MG tablet Take 5 mg by mouth daily.    [provider]  magic mouthwash (nystatin, hydrocortisone, diphenhydrAMINE, lidocaine) suspension Swish and swallow 5 mLs 3 (three) times daily as needed for mouth pain. 09/25/23   Leath-Warren, Sadie Haber, NP  metFORMIN (GLUCOPHAGE) 1000 MG tablet take 1 tablet by mouth 2 times a day with a meal. Patient taking differently: Take 1,000 mg by mouth 2 (two) times daily with a meal. 08/10/23   Dani Gobble, NP  methadone (DOLOPHINE) 10 MG/ML solution Take 65 mg by mouth daily.    [provider]  methocarbamol (ROBAXIN) 500 MG tablet Take 500 mg by mouth 2 (two) times daily. 06/15/23   [provider]  omeprazole (PRILOSEC) 40 MG capsule TAKE 1 CAPSULE BY MOUTH TWICE DAILY. 05/10/23   Carlan, Chelsea L, NP  ondansetron (ZOFRAN) 4 MG tablet Take 1 tablet (4 mg total) by mouth every 8 (eight) hours as needed for nausea or vomiting. 03/08/23   Carlan, Chelsea L, NP  Oxycodone HCl 10 MG TABS Take 10 mg by mouth 4 (four) times daily as needed (pain). 09/03/23   [provider]  potassium chloride (KLOR-CON) 10 MEQ tablet Take 10 mEq by mouth 2 (two) times daily. 08/10/23   [provider]  pregabalin (LYRICA) 75 MG capsule Take 2 capsules by mouth 2 (two) times daily. 09/03/23 10/03/23  [provider]  propranolol (INDERAL) 10 MG tablet Take 10 mg by mouth 3 (three) times daily. 08/02/23   [provider]  QUEtiapine (SEROQUEL) 25 MG tablet Take 25 mg by mouth at bedtime.    [provider]  torsemide (DEMADEX) 20 MG tablet Take 20 mg by mouth 2 (two) times daily. 09/14/23   [provider]  venlafaxine XR (EFFEXOR XR) 75 MG 24 hr capsule Take 1 capsule (75 mg total) by mouth daily with breakfast. 05/25/23   Del Newman Nip, Tenna Child, FNP  Vitamin D, Ergocalciferol, (DRISDOL) 1.25 MG (50000 UNIT) CAPS capsule Take 1 capsule (50,000 Units total) by mouth every 7 (seven) days. Patient taking differently: Take 50,000 Units by mouth once a week. 12/10/21   Dani Gobble, NP      Allergies    Iodine-131, Ivp dye [iodinated contrast media], Ketorolac tromethamine, Tylenol [acetaminophen], Vancomycin, Gabapentin, Nsaids, and Suboxone [buprenorphine hcl-naloxone hcl]    Review of Systems   Review of Systems  Constitutional:  Negative for chills and fever.  HENT:  Negative for ear pain and sore throat.   Eyes:  Negative for pain and visual disturbance.  Respiratory:  Positive for shortness of breath. Negative for cough.   Cardiovascular:  Positive for leg swelling. Negative for chest pain and palpitations.  Gastrointestinal:  Positive for nausea and vomiting. Negative for abdominal pain.  Genitourinary:  Negative for dysuria and hematuria.  Musculoskeletal:  Negative for arthralgias and back pain.  Skin:  Negative for color change and rash.  Neurological:  Negative for seizures and syncope.  All other systems reviewed and are negative.  Physical Exam Updated Vital Signs BP 117/69   Pulse (!) 105   Temp 99 F (37.2 C) (Oral)   Resp 19   Ht 5\' 9"  (1.753 m)   Wt 113.4 kg   LMP  (LMP Unknown)   SpO2 93%   BMI 36.92 kg/m  Physical Exam Vitals and nursing note reviewed.  Constitutional:      General: She is not in acute distress.    Appearance: Normal appearance.  HENT:     Head: Normocephalic and atraumatic.  Eyes:     Conjunctiva/sclera: Conjunctivae  normal.  Cardiovascular:     Rate and Rhythm: Normal rate.     Comments: Dorsalis pedis 2+ equal bilaterally Pulmonary:     Effort: Pulmonary effort is normal. Tachypnea present. No accessory muscle usage or respiratory distress.     Breath sounds: Normal breath sounds.  Abdominal:     General: Bowel sounds are normal. There is no distension.     Palpations: Abdomen is soft.     Tenderness: There is abdominal tenderness (LUQ). There is no right CVA tenderness, left CVA tenderness, guarding or rebound.  Musculoskeletal:     Right lower leg: Edema (1+) present.     Left lower leg: Edema (2+) present.     Comments: TTP of LLE No TTP of RLE  Skin:    Capillary Refill: Capillary refill takes less than 2 seconds.     Coloration: Skin is not jaundiced or pale.     Findings: No rash.     Comments: Erythema warmth to LLE No erythema nor warmth to RLE  Neurological:     Mental Status: She is alert and oriented to person, place, and time. Mental status is at baseline.     Sensory: No sensory deficit.     Motor: No weakness.     Gait: Gait normal.     Comments: Ambulated without difficulty Sensation equal and intact BLE    ED Results / Procedures / Treatments   Labs (all labs ordered are listed, but only abnormal results are displayed) Labs Reviewed  CBC WITH DIFFERENTIAL/PLATELET - Abnormal; Notable for the following components:      Result Value   RBC 3.58 (*)    Hemoglobin 10.4 (*)    HCT 32.6 (*)    RDW 15.6 (*)    Platelets 116 (*)    All other components within normal limits  COMPREHENSIVE METABOLIC PANEL - Abnormal; Notable for the following components:   Glucose, Bld 240 (*)    Creatinine, Ser 0.39 (*)    Calcium 7.9 (*)    Total Protein 5.9 (*)    Albumin 2.5 (*)    Alkaline Phosphatase 152 (*)    All other components within normal limits  D-DIMER, QUANTITATIVE - Abnormal; Notable for the following components:   D-Dimer, Quant 4.34 (*)    All other components within  normal limits  RESP PANEL BY RT-PCR (RSV, FLU A&B, COVID)  RVPGX2  BRAIN NATRIURETIC PEPTIDE  HCG, SERUM, QUALITATIVE  TROPONIN I (HIGH SENSITIVITY)  TROPONIN I (HIGH SENSITIVITY)    EKG EKG Interpretation Date/Time:  Saturday September 25 2023 13:47:10 EST Ventricular Rate:  112 PR Interval:  146 QRS Duration:  82 QT Interval:  340 QTC Calculation: 465 R Axis:   52  Text Interpretation: Sinus tachycardia Borderline T wave abnormalities No significant change since last tracing Confirmed by Jacalyn Lefevre [phone]) on 09/25/2023 2:41:35 PM  Radiology DG Chest Port 1 View  Result Date: 09/25/2023 CLINICAL DATA:  dyspnea EXAM: PORTABLE CHEST 1 VIEW COMPARISON:  September 14, 2023 FINDINGS: The cardiomediastinal silhouette is unchanged in contour. Trace residual LEFT pleural effusion. No pneumothorax. Favored overall improved aeration at the LEFT lung base with minimal linear opacity noted. IMPRESSION: Trace residual LEFT pleural effusion. No pneumothorax. Electronically Signed   By: Meda Klinefelter M.D.   On: 09/25/2023 13:46    Procedures Procedures    Medications Ordered in ED Medications  ondansetron (ZOFRAN) injection 4 mg (4 mg Intravenous Given 09/25/23 1558)  methylPREDNISolone sodium succinate (SOLU-MEDROL) 40 mg/mL injection 40 mg (40 mg Intravenous Given 09/25/23 1559)  diphenhydrAMINE (BENADRYL) injection 50 mg (50 mg Intravenous Given 09/25/23 1557)  HYDROmorphone (DILAUDID) injection 1 mg (1 mg Intravenous Given 09/25/23 1600)  HYDROmorphone (DILAUDID) injection 0.5 mg (0.5 mg Intravenous Given 09/25/23 1659)  diphenhydrAMINE (BENADRYL) capsule 50 mg (50 mg Oral Given 09/25/23 1902)    ED Course/ Medical Decision Making/ A&P                                 Medical Decision Making Amount and/or Complexity of Data Reviewed Labs: ordered. Radiology: ordered.  Risk Prescription drug management.   Patient presents to the ED for concern of LLE swelling and SOB  over past week, this involves an extensive number of treatment options, and is a complaint that carries with it a high risk of complications and morbidity.  The differential diagnosis includes DVT, PE, PNA, lymphedema, viral inx, acute on chronic HF. This is not an exhaustive list   Co morbidities that complicate the patient evaluation  Chronic DVT hx (IVF + AC with eliquis), T2DM S/P mechanical thrombectomy   Additional history obtained:  Additional history obtained from Nursing, Outside Medical Records, and Past Admission   External records from outside source obtained and reviewed including RN triage note, past admission for DVT on 09/15/2023, UC note from today   Lab Tests:  I Ordered, and personally interpreted labs.  The pertinent results include: Elevated d dimer 4.34 Hyperglycemia, hypocalcemia, hypoalbuminemia, elevated ALP, mild anemia, thrombocytopenia per her baseline and not requiring ED intervention   Imaging Studies ordered:  I ordered imaging studies including CXR, CTPE, CT abd pelvis  Pending at sign out    Cardiac Monitoring:  The patient was maintained on a cardiac monitor.  I personally viewed and interpreted the cardiac monitored which showed an underlying rhythm of: sinus tachycardia with no STE nor ischemia   Medicines ordered and prescription drug management:  I ordered medication including zofran and dilaudid for pain and nausea  Reevaluation of the patient after these medicines showed that the patient improved Steroid and benadryl both delayed and provided to pt at 4pm due to patient's difficult peripheral access. Christy RN reports confusion regarding benadryl within allergic rxn and gave benadryl with steroid vs  1 hr prior to scan. Will order additional benadryl at 1900 to have an hr prior to scan. I have reviewed the patients home medicines and have made adjustments as needed   Problem List / ED Course:  Sore throat LLE  edema SOB   Reevaluation:  After the interventions noted above, I reevaluated the patient and found that they have :improved   Dispostion:  Patient is tachycardic at 113 and tachypneic at 22 but otherwise afebrile.  See HPI.  Physical exam significant for left upper quadrant abdominal tenderness likely secondary to recent splenic embolization.  LLE is 2+ pitting edema, warm, erythematous when compared to RLE which is 1+ pitting edema.  Lung sounds CTAB. CT PA and ct abd pelvis ordered d/t previous thrombus extending above IVF and LUQ pain. Will provide dilaudid for pain and zofran for nausea.  Allergic rxn precautions provided to pt prior to CT scan due to contrast allergy.  Sign out to [place] PA pending CT scans. If negative for thrombus and/or emergent intervention, patient can be discharged and f/u with outpatient U/S to r/o DVT. However, pt may need to be admitted if thrombus found on CT requiring anticoagulation.        Final Clinical Impression(s) / ED Diagnoses Final diagnoses:  None    Rx / DC Orders ED Discharge Orders     None         Judithann Sheen, PA 09/25/23 1906    Jacalyn Lefevre, MD 09/26/23 [phone]

## 2023-09-25 NOTE — ED Triage Notes (Signed)
Dx with DVT to LT lower leg.  Reports more pain than usual, increased swelling and nausea for the past couple of days.  Currently on eliquis.

## 2023-09-26 ENCOUNTER — Other Ambulatory Visit: Payer: Self-pay

## 2023-09-26 ENCOUNTER — Inpatient Hospital Stay (HOSPITAL_COMMUNITY): Payer: MEDICAID

## 2023-09-26 ENCOUNTER — Other Ambulatory Visit (HOSPITAL_COMMUNITY): Payer: Self-pay | Admitting: *Deleted

## 2023-09-26 DIAGNOSIS — I2609 Other pulmonary embolism with acute cor pulmonale: Secondary | ICD-10-CM

## 2023-09-26 DIAGNOSIS — K7460 Unspecified cirrhosis of liver: Secondary | ICD-10-CM

## 2023-09-26 DIAGNOSIS — K219 Gastro-esophageal reflux disease without esophagitis: Secondary | ICD-10-CM

## 2023-09-26 DIAGNOSIS — I2699 Other pulmonary embolism without acute cor pulmonale: Secondary | ICD-10-CM

## 2023-09-26 DIAGNOSIS — I824Y2 Acute embolism and thrombosis of unspecified deep veins of left proximal lower extremity: Secondary | ICD-10-CM | POA: Diagnosis not present

## 2023-09-26 DIAGNOSIS — F17200 Nicotine dependence, unspecified, uncomplicated: Secondary | ICD-10-CM

## 2023-09-26 DIAGNOSIS — I82492 Acute embolism and thrombosis of other specified deep vein of left lower extremity: Secondary | ICD-10-CM

## 2023-09-26 DIAGNOSIS — E1165 Type 2 diabetes mellitus with hyperglycemia: Secondary | ICD-10-CM

## 2023-09-26 DIAGNOSIS — K7469 Other cirrhosis of liver: Secondary | ICD-10-CM

## 2023-09-26 DIAGNOSIS — J9601 Acute respiratory failure with hypoxia: Secondary | ICD-10-CM

## 2023-09-26 LAB — ECHOCARDIOGRAM COMPLETE
AR max vel: 2.62 cm2
AV Area VTI: 2.51 cm2
AV Area mean vel: 2.46 cm2
AV Mean grad: 11 mm[Hg]
AV Peak grad: 17.5 mm[Hg]
Ao pk vel: 2.09 m/s
Area-P 1/2: 2.95 cm2
Height: 69 in
S' Lateral: 2.4 cm
Weight: 4042.35 [oz_av]

## 2023-09-26 LAB — CBC WITH DIFFERENTIAL/PLATELET
Abs Immature Granulocytes: 0.01 10*3/uL (ref 0.00–0.07)
Basophils Absolute: 0 10*3/uL (ref 0.0–0.1)
Basophils Relative: 0 %
Eosinophils Absolute: 0 10*3/uL (ref 0.0–0.5)
Eosinophils Relative: 0 %
HCT: 33 % — ABNORMAL LOW (ref 36.0–46.0)
Hemoglobin: 10.5 g/dL — ABNORMAL LOW (ref 12.0–15.0)
Immature Granulocytes: 0 %
Lymphocytes Relative: 8 %
Lymphs Abs: 0.5 10*3/uL — ABNORMAL LOW (ref 0.7–4.0)
MCH: 28.9 pg (ref 26.0–34.0)
MCHC: 31.8 g/dL (ref 30.0–36.0)
MCV: 90.9 fL (ref 80.0–100.0)
Monocytes Absolute: 0.4 10*3/uL (ref 0.1–1.0)
Monocytes Relative: 8 %
Neutro Abs: 4.8 10*3/uL (ref 1.7–7.7)
Neutrophils Relative %: 84 %
Platelets: 122 10*3/uL — ABNORMAL LOW (ref 150–400)
RBC: 3.63 MIL/uL — ABNORMAL LOW (ref 3.87–5.11)
RDW: 15.4 % (ref 11.5–15.5)
WBC: 5.7 10*3/uL (ref 4.0–10.5)
nRBC: 0 % (ref 0.0–0.2)

## 2023-09-26 LAB — COMPREHENSIVE METABOLIC PANEL
ALT: 18 U/L (ref 0–44)
AST: 20 U/L (ref 15–41)
Albumin: 2.3 g/dL — ABNORMAL LOW (ref 3.5–5.0)
Alkaline Phosphatase: 150 U/L — ABNORMAL HIGH (ref 38–126)
Anion gap: 8 (ref 5–15)
BUN: 6 mg/dL (ref 6–20)
CO2: 24 mmol/L (ref 22–32)
Calcium: 8 mg/dL — ABNORMAL LOW (ref 8.9–10.3)
Chloride: 103 mmol/L (ref 98–111)
Creatinine, Ser: 0.48 mg/dL (ref 0.44–1.00)
GFR, Estimated: >60 mL/min (ref >60)
Glucose, Bld: 345 mg/dL — ABNORMAL HIGH (ref 70–99)
Potassium: 4.3 mmol/L (ref 3.5–5.1)
Sodium: 135 mmol/L (ref 135–145)
Total Bilirubin: 0.9 mg/dL (ref <1.2)
Total Protein: 5.6 g/dL — ABNORMAL LOW (ref 6.5–8.1)

## 2023-09-26 LAB — MAGNESIUM: Magnesium: 1.7 mg/dL (ref 1.7–2.4)

## 2023-09-26 LAB — GLUCOSE, CAPILLARY
Glucose-Capillary: 190 mg/dL — ABNORMAL HIGH (ref 70–99)
Glucose-Capillary: 219 mg/dL — ABNORMAL HIGH (ref 70–99)
Glucose-Capillary: 221 mg/dL — ABNORMAL HIGH (ref 70–99)
Glucose-Capillary: 254 mg/dL — ABNORMAL HIGH (ref 70–99)
Glucose-Capillary: 291 mg/dL — ABNORMAL HIGH (ref 70–99)
Glucose-Capillary: 326 mg/dL — ABNORMAL HIGH (ref 70–99)

## 2023-09-26 LAB — HEPARIN LEVEL (UNFRACTIONATED)
Heparin Unfractionated: <0.1 [IU]/mL — ABNORMAL LOW (ref 0.30–0.70)
Heparin Unfractionated: <0.1 [IU]/mL — ABNORMAL LOW (ref 0.30–0.70)
Heparin Unfractionated: 0.63 [IU]/mL (ref 0.30–0.70)

## 2023-09-26 MED ORDER — INSULIN DETEMIR 100 UNIT/ML ~~LOC~~ SOLN
45.0000 [IU] | Freq: Every day | SUBCUTANEOUS | Status: DC
  Administered 2023-09-26 – 2023-09-27 (×2): 45 [IU] via SUBCUTANEOUS
  Filled 2023-09-26 (×3): qty 0.45

## 2023-09-26 MED ORDER — LISINOPRIL 5 MG PO TABS
5.0000 mg | ORAL_TABLET | Freq: Every day | ORAL | Status: DC
  Administered 2023-09-26 – 2023-09-28 (×3): 5 mg via ORAL
  Filled 2023-09-26 (×3): qty 1

## 2023-09-26 MED ORDER — ONDANSETRON HCL 4 MG/2ML IJ SOLN: 4.0000 mg | Freq: Four times a day (QID) | INTRAMUSCULAR | Status: DC | PRN

## 2023-09-26 MED ORDER — ONDANSETRON HCL 4 MG PO TABS: 4.0000 mg | ORAL_TABLET | Freq: Four times a day (QID) | ORAL | Status: DC | PRN

## 2023-09-26 MED ORDER — HEPARIN BOLUS VIA INFUSION
4000.0000 [IU] | Freq: Once | INTRAVENOUS | Status: AC
  Administered 2023-09-26: 4000 [IU] via INTRAVENOUS
  Filled 2023-09-26: qty 4000

## 2023-09-26 MED ORDER — NICOTINE 21 MG/24HR TD PT24
21.0000 mg | MEDICATED_PATCH | Freq: Every day | TRANSDERMAL | Status: DC
  Administered 2023-09-27 – 2023-09-28 (×2): 21 mg via TRANSDERMAL
  Filled 2023-09-26 (×2): qty 1

## 2023-09-26 MED ORDER — HYDROXYZINE PAMOATE 50 MG PO CAPS: 50.0000 mg | ORAL_CAPSULE | Freq: Three times a day (TID) | ORAL | Status: DC | PRN

## 2023-09-26 MED ORDER — ACETAMINOPHEN 650 MG RE SUPP: 650.0000 mg | Freq: Four times a day (QID) | RECTAL | Status: DC | PRN

## 2023-09-26 MED ORDER — INSULIN ASPART 100 UNIT/ML IJ SOLN
0.0000 [IU] | Freq: Every day | INTRAMUSCULAR | Status: DC
  Administered 2023-09-26 – 2023-09-27 (×2): 3 [IU] via SUBCUTANEOUS

## 2023-09-26 MED ORDER — ARIPIPRAZOLE 10 MG PO TABS
20.0000 mg | ORAL_TABLET | Freq: Every day | ORAL | Status: DC
  Administered 2023-09-26 – 2023-09-28 (×3): 20 mg via ORAL
  Filled 2023-09-26 (×3): qty 2

## 2023-09-26 MED ORDER — PREGABALIN 75 MG PO CAPS
150.0000 mg | ORAL_CAPSULE | Freq: Two times a day (BID) | ORAL | Status: DC
  Administered 2023-09-26 – 2023-09-28 (×6): 150 mg via ORAL
  Filled 2023-09-26 (×6): qty 2

## 2023-09-26 MED ORDER — HYDROXYZINE HCL 25 MG PO TABS: 50.0000 mg | ORAL_TABLET | Freq: Three times a day (TID) | ORAL | Status: DC | PRN

## 2023-09-26 MED ORDER — HYDROMORPHONE HCL 1 MG/ML IJ SOLN
1.0000 mg | INTRAMUSCULAR | Status: DC | PRN
  Administered 2023-09-27 – 2023-09-28 (×7): 1 mg via INTRAVENOUS
  Filled 2023-09-26 (×8): qty 1

## 2023-09-26 MED ORDER — NICOTINE 14 MG/24HR TD PT24
14.0000 mg | MEDICATED_PATCH | Freq: Every day | TRANSDERMAL | Status: DC
  Administered 2023-09-26: 14 mg via TRANSDERMAL
  Filled 2023-09-26: qty 1

## 2023-09-26 MED ORDER — OXYCODONE HCL 5 MG PO TABS: 10.0000 mg | ORAL_TABLET | Freq: Four times a day (QID) | ORAL | Status: DC | PRN

## 2023-09-26 MED ORDER — MORPHINE SULFATE (PF) 2 MG/ML IV SOLN
2.0000 mg | INTRAVENOUS | Status: DC | PRN
  Administered 2023-09-26 (×5): 2 mg via INTRAVENOUS
  Filled 2023-09-26 (×5): qty 1

## 2023-09-26 MED ORDER — FAMOTIDINE 20 MG PO TABS
20.0000 mg | ORAL_TABLET | Freq: Every day | ORAL | Status: DC
Start: 2023-09-26 — End: 2023-09-28
  Administered 2023-09-26 – 2023-09-28 (×3): 20 mg via ORAL
  Filled 2023-09-26 (×3): qty 1

## 2023-09-26 MED ORDER — ACETAMINOPHEN 325 MG PO TABS
650.0000 mg | ORAL_TABLET | Freq: Four times a day (QID) | ORAL | Status: DC | PRN
  Filled 2023-09-26: qty 2

## 2023-09-26 MED ORDER — CLONAZEPAM 0.5 MG PO TABS
0.5000 mg | ORAL_TABLET | Freq: Two times a day (BID) | ORAL | Status: DC | PRN
  Administered 2023-09-26 – 2023-09-27 (×2): 0.5 mg via ORAL
  Filled 2023-09-26 (×2): qty 1

## 2023-09-26 MED ORDER — OXYCODONE HCL 5 MG PO TABS
10.0000 mg | ORAL_TABLET | Freq: Four times a day (QID) | ORAL | Status: DC | PRN
  Administered 2023-09-26 – 2023-09-28 (×6): 10 mg via ORAL
  Filled 2023-09-26 (×8): qty 2

## 2023-09-26 MED ORDER — INSULIN ASPART 100 UNIT/ML IJ SOLN
0.0000 [IU] | Freq: Three times a day (TID) | INTRAMUSCULAR | Status: DC
  Administered 2023-09-26: 3 [IU] via SUBCUTANEOUS
  Administered 2023-09-26 – 2023-09-27 (×3): 5 [IU] via SUBCUTANEOUS
  Administered 2023-09-27 (×2): 3 [IU] via SUBCUTANEOUS
  Administered 2023-09-28 (×2): 5 [IU] via SUBCUTANEOUS

## 2023-09-26 MED ORDER — QUETIAPINE FUMARATE 25 MG PO TABS
25.0000 mg | ORAL_TABLET | Freq: Every day | ORAL | Status: DC
  Administered 2023-09-26 – 2023-09-27 (×2): 25 mg via ORAL
  Filled 2023-09-26 (×2): qty 1

## 2023-09-26 NOTE — Progress Notes (Addendum)
PROGRESS NOTE  Jaime [name], is a 37 y.o. female, DOB - [birth year], VWU:[id number]  Admit date - 09/25/2023   Admitting Physician Lilyan Gilford, DO  Outpatient Primary MD for the patient is [facility], [facility]  LOS - 1  Chief Complaint  Patient presents with   Leg Pain      Brief Narrative:  37 y.o. 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 readmitted on 09/26/2023 with acute PE in the setting of noncompliance/missed doses of Eliquis   -Assessment and Plan: 1)Acute pulmonary embolism /left lower extremity DVT - Patient with recurrent DVTs and PEs -Previously had IVC filter placed on 08/28/2023 subsequently IVC filter clotted -Had mechanical thrombectomy on 09/16/2023 in the setting of DVT PE  -CTA chest from 09/25/2023 with acute PE -Venous Doppler of the left lower extremity from 09/26/2023 with occlusive left lower extremity from the left femoral vein to the popliteal vein -Echo from 09/26/2023 with EF of 55 to 60%,, no right heart strain - Patient was on Eliquis at home but reports missed doses - Pharmacy confirms missed doses with lab anti-- Xa - Currently on heparin drip  2) acute hypoxic respiratory failure--- due to #1 above -Currently requiring 2 L of oxygen via nasal cannula  3)DM2-A1c is 9.4 reflecting uncontrolled DM with hyperglycemia PTA -Continue Levemir 45 units Use Novolog/Humalog Sliding scale insulin with Accu-Cheks/Fingersticks as ordered   4)Gastroesophageal reflux disease - Continue Pepcid  5)Liver Cirrhosis -previously treated for 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  7) depression/anxiety --- stable,  continue  Abilify and Seroquel scheduled -Alprazolam and hydroxyzine as needed  8)Tobacco use disorder -Smokes up to 1 pack/day-----nicotine patch as ordered Smoking cessation counseling for 4 minutes today,  I have discussed tobacco cessation with the patient.  I have counseled the patient regarding the negative impacts of continued tobacco use including but not limited to lung cancer, COPD, and cardiovascular disease.  I have discussed alternatives to tobacco and modalities that may help facilitate tobacco cessation including but not limited to biofeedback, hypnosis, and medications.  Total time spent with tobacco counseling was 4 minutes.  Total care time 51 minutes  Status is: Inpatient   Disposition: The patient is from: Home              Anticipated d/c is to: Home              Anticipated d/c date is: 2 days              Patient currently is not medically stable to d/c. Barriers: Not Clinically Stable-   Code Status :  -  Code Status: Full Code   Family Communication:    NA (patient is alert, awake and coherent)   DVT Prophylaxis  :   - iv Heparin     Lab Results  Component Value Date   PLT 122 (L) 09/26/2023    Inpatient Medications  Scheduled Meds:  ARIPiprazole  20 mg Oral Daily   famotidine  20 mg Oral Daily   insulin aspart  0-15 Units Subcutaneous TID WC   insulin aspart  0-5 Units Subcutaneous QHS   insulin detemir  45 Units Subcutaneous QHS   lisinopril  5 mg Oral Daily   nicotine  14 mg Transdermal Daily   pregabalin  150 mg Oral BID   QUEtiapine  25 mg Oral QHS   Continuous Infusions:  heparin 2,300 Units/hr (09/26/23 1310)   PRN Meds:.acetaminophen **OR** acetaminophen, clonazePAM, hydrOXYzine, morphine injection, ondansetron **OR** ondansetron (ZOFRAN) IV, oxyCODONE   Anti-infectives (From admission, onward)    None         Subjective: Jaime [name] today has no fevers, no emesis,  No chest pain,   -No bleeding concerns -Complains of left leg  pain -Dyspnea -Hypoxia persist  Objective: Vitals:   09/26/23 0735 09/26/23 0954 09/26/23 1000 09/26/23 1345  BP: 106/70 121/74  (!) 107/57  Pulse: (!) 101   (!) 109  Resp: 18   18  Temp: 98.1 F (36.7 C)   98.2 F (36.8 C)  TempSrc: Oral   Oral  SpO2: 95%  95% 96%  Weight:      Height:        Intake/Output Summary (Last 24 hours) at 09/26/2023 1655 Last data filed at 09/26/2023 1500 Gross per 24 hour  Intake 906.98 ml  Output --  Net 906.98 ml   Filed Weights   09/25/23 1239 09/25/23 2323  Weight: 113.4 kg 114.6 kg    Physical Exam  Gen:- Awake Alert, no conversational dyspnea HEENT:- Black.AT, No sclera icterus Neck-Supple Neck,No JVD,.  Lungs-no wheezing, fair symmetrical air movement CV- S1, S2 normal, regular ,, tachycardic Abd-  +ve B.Sounds, Abd Soft, No tenderness, increased truncal adiposity Extremity/Skin:- pedal pulses present,  left leg swelling and discomfort on palpation Psych-affect is appropriate, oriented x3 Neuro-no new focal deficits, no tremors  Data Reviewed: I have personally reviewed following labs and imaging studies  CBC: Recent Labs  Lab 09/25/23 1330 09/26/23 0344  WBC 5.3 5.7  NEUTROABS 3.8 4.8  HGB 10.4* 10.5*  HCT 32.6* 33.0*  MCV 91.1 90.9  PLT 116* 122*   Basic Metabolic Panel: Recent Labs  Lab 09/25/23 1330 09/25/23 2113 09/26/23 0344  NA 136 134* 135  K 3.5 4.3 4.3  CL 106 104 103  CO2 23 24 24   GLUCOSE 240* 306* 345*  BUN 6 7 6   CREATININE 0.39* 0.46 0.48  CALCIUM 7.9* 8.0* 8.0*  MG  --   --  1.7   GFR: Estimated Creatinine Clearance: 127.6 mL/min (by C-G formula based on SCr of 0.48 mg/dL). Liver Function Tests: Recent Labs  Lab 09/25/23 1330 09/26/23 0344  AST 19 20  ALT 19 18  ALKPHOS 152* 150*  BILITOT 1.0 0.9  PROT 5.9* 5.6*  ALBUMIN 2.5* 2.3*   Recent Results (from the past 240 hour(s))  Resp panel by RT-PCR (RSV, Flu A&B, Covid) Anterior Nasal Swab     Status: None   Collection Time:  09/25/23  2:16 PM   Specimen: Anterior Nasal Swab  Result Value Ref Range Status   SARS Coronavirus 2 by RT PCR NEGATIVE NEGATIVE Final    Comment: (NOTE) SARS-CoV-2 target nucleic acids are NOT DETECTED.  The SARS-CoV-2 RNA is generally detectable in upper respiratory specimens during the acute phase of infection. The lowest concentration of SARS-CoV-2 viral copies this assay can detect is 138 copies/mL. A negative result does not preclude SARS-Cov-2 infection and should not be used as the sole basis for treatment or other patient management decisions. A negative result may occur with  improper specimen collection/handling, submission of specimen other than nasopharyngeal swab, presence of viral mutation(s) within the areas targeted by this assay, and inadequate number of viral copies(<138 copies/mL).  A negative result must be combined with clinical observations, patient history, and epidemiological information. The expected result is Negative.  Fact Sheet for Patients:  BloggerCourse.com  Fact Sheet for Healthcare Providers:  SeriousBroker.it  This test is no t yet approved or cleared by the [place] FDA and  has been authorized for detection and/or diagnosis of SARS-CoV-2 by FDA under an Emergency Use Authorization (EUA). This EUA will remain  in effect (meaning this test can be used) for the duration of the COVID-19 declaration under Section 564(b)(1) of the Act, 21 U.S.C.section 360bbb-3(b)(1), unless the authorization is terminated  or revoked sooner.       Influenza A by PCR NEGATIVE NEGATIVE Final   Influenza B by PCR NEGATIVE NEGATIVE Final    Comment: (NOTE) The Xpert Xpress SARS-CoV-2/FLU/RSV plus assay is intended as an aid in the diagnosis of influenza from Nasopharyngeal swab specimens and should not be used as a sole basis for treatment. Nasal washings and aspirates are unacceptable for Xpert Xpress  SARS-CoV-2/FLU/RSV testing.  Fact Sheet for Patients: BloggerCourse.com  Fact Sheet for Healthcare Providers: SeriousBroker.it  This test is not yet approved or cleared by the [place] FDA and has been authorized for detection and/or diagnosis of SARS-CoV-2 by FDA under an Emergency Use Authorization (EUA). This EUA will remain in effect (meaning this test can be used) for the duration of the COVID-19 declaration under Section 564(b)(1) of the Act, 21 U.S.C. section 360bbb-3(b)(1), unless the authorization is terminated or revoked.     Resp Syncytial Virus by PCR NEGATIVE NEGATIVE Final    Comment: (NOTE) Fact Sheet for Patients: BloggerCourse.com  Fact Sheet for Healthcare Providers: SeriousBroker.it  This test is not yet approved or cleared by the [place] FDA and has been authorized for detection and/or diagnosis of SARS-CoV-2 by FDA under an Emergency Use Authorization (EUA). This EUA will remain in effect (meaning this test can be used) for the duration of the COVID-19 declaration under Section 564(b)(1) of the Act, 21 U.S.C. section 360bbb-3(b)(1), unless the authorization is terminated or revoked.  Performed at [facility], [address], [place], [place] [address]     Radiology Studies: ECHOCARDIOGRAM COMPLETE  Result Date: 09/26/2023    ECHOCARDIOGRAM REPORT   Patient Name:   Jaime [name] Date of Exam: 09/26/2023 Medical Rec #:  [id number]        Height:       69.0 in Accession #:    [id number]       Weight:       252.6 lb Date of Birth:  1986-09-17         BSA:          2.282 m Patient Age:    37 years         BP:           106/70 mmHg Patient Gender: F                HR:           99 bpm. [facility] Procedure: 2D Echo, Cardiac Doppler and Color Doppler Indications:    Pulmonary Embolus I26.09  History:        Patient has prior history of  Echocardiogram examinations, most                 recent 07/31/2020. COPD; Risk Factors:Diabetes, Dyslipidemia and                 Current  Smoker.  Sonographer:    Celesta Gentile RCS Referring Phys: [id number] ASIA B ZIERLE-GHOSH IMPRESSIONS  1. Left ventricular ejection fraction, by estimation, is 55 to 60%. The left ventricle has normal function. The left ventricle has no regional wall motion abnormalities. Left ventricular diastolic parameters were normal.  2. Right ventricular systolic function is normal. The right ventricular size is normal.  3. The mitral valve is normal in structure. No evidence of mitral valve regurgitation. No evidence of mitral stenosis.  4. The aortic valve is normal in structure. Aortic valve regurgitation is not visualized. No aortic stenosis is present.  5. The inferior vena cava is normal in size with greater than 50% respiratory variability, suggesting right atrial pressure of 3 mmHg. FINDINGS  Left Ventricle: Left ventricular ejection fraction, by estimation, is 55 to 60%. The left ventricle has normal function. The left ventricle has no regional wall motion abnormalities. The left ventricular internal cavity size was normal in size. There is  no left ventricular hypertrophy. Left ventricular diastolic parameters were normal. Right Ventricle: The right ventricular size is normal. No increase in right ventricular wall thickness. Right ventricular systolic function is normal. Left Atrium: Left atrial size was normal in size. Right Atrium: Right atrial size was normal in size. Pericardium: There is no evidence of pericardial effusion. Mitral Valve: The mitral valve is normal in structure. No evidence of mitral valve regurgitation. No evidence of mitral valve stenosis. Tricuspid Valve: The tricuspid valve is normal in structure. Tricuspid valve regurgitation is not demonstrated. No evidence of tricuspid stenosis. Aortic Valve: The aortic valve is normal in structure. Aortic valve  regurgitation is not visualized. No aortic stenosis is present. Aortic valve mean gradient measures 11.0 mmHg. Aortic valve peak gradient measures 17.5 mmHg. Aortic valve area, by VTI measures 2.51 cm. Pulmonic Valve: The pulmonic valve was normal in structure. Pulmonic valve regurgitation is not visualized. No evidence of pulmonic stenosis. Aorta: The aortic root is normal in size and structure. Venous: The inferior vena cava is normal in size with greater than 50% respiratory variability, suggesting right atrial pressure of 3 mmHg. IAS/Shunts: No atrial level shunt detected by color flow Doppler.  LEFT VENTRICLE PLAX 2D LVIDd:         4.10 cm   Diastology LVIDs:         2.40 cm   LV e' medial:    9.25 cm/s LV PW:         1.10 cm   LV E/e' medial:  12.2 LV IVS:        1.00 cm   LV e' lateral:   11.40 cm/s LVOT diam:     2.00 cm   LV E/e' lateral: 9.9 LV SV:         111 LV SV Index:   49 LVOT Area:     3.14 cm  RIGHT VENTRICLE RV S prime:     15.60 cm/s TAPSE (M-mode): 2.6 cm LEFT ATRIUM             Index        RIGHT ATRIUM           Index LA diam:        3.80 cm 1.67 cm/m   RA Area:     15.20 cm LA Vol (A2C):   53.2 ml 23.32 ml/m  RA Volume:   36.30 ml  15.91 ml/m LA Vol (A4C):   48.9 ml 21.43 ml/m LA Biplane Vol: 51.0 ml 22.35 ml/m  AORTIC  VALVE AV Area (Vmax):    2.62 cm AV Area (Vmean):   2.46 cm AV Area (VTI):     2.51 cm AV Vmax:           209.00 cm/s AV Vmean:          151.000 cm/s AV VTI:            0.443 m AV Peak Grad:      17.5 mmHg AV Mean Grad:      11.0 mmHg LVOT Vmax:         174.00 cm/s LVOT Vmean:        118.000 cm/s LVOT VTI:          0.354 m LVOT/AV VTI ratio: 0.80  AORTA Ao Root diam: 3.60 cm MITRAL VALVE MV Area (PHT): 2.95 cm     SHUNTS MV Decel Time: 257 msec     Systemic VTI:  0.35 m MV E velocity: 113.00 cm/s  Systemic Diam: 2.00 cm MV A velocity: 92.20 cm/s MV E/A ratio:  1.23 Donato Schultz MD Electronically signed by Donato Schultz MD Signature Date/Time: 09/26/2023/1:47:34 PM     Final    US Venous Img Lower Unilateral Left (DVT)  Result Date: 09/26/2023 CLINICAL DATA:  Left leg swelling.  History of DVT. EXAM: LEFT LOWER EXTREMITY VENOUS DOPPLER ULTRASOUND TECHNIQUE: Gray-scale sonography with compression, as well as color and duplex ultrasound, were performed to evaluate the deep venous system(s) from the level of the common femoral vein through the popliteal and proximal calf veins. COMPARISON:  CT venogram yesterday. Left lower extremity DVT ultrasound dated September 13, 2023. FINDINGS: VENOUS Occlusive thrombus within the left common femoral vein. Nonocclusive thrombus within the profunda femoral vein, femoral vein, and popliteal vein. Normal compressibility of the saphenofemoral junction and posterior tibial vein. Peroneal vein is not visualized. Limited views of the contralateral common femoral vein are unremarkable. OTHER None. Limitations: None. IMPRESSION: 1. Continued left lower extremity DVT from the left common femoral vein to the popliteal vein. DVT is occlusive within the common femoral vein. Electronically Signed   By: Obie Dredge M.D.   On: 09/26/2023 12:34   CT VENOGRAM ABD/PEL  Result Date: 09/25/2023 CLINICAL DATA:  Caval thrombus and left lower extremity DVT. Follow-up examination EXAM: CT VENOGRAM ABDOMEN AND PELVIS TECHNIQUE: Venographic phase images of the abdomen and pelvis were obtained following the administration of intravenous contrast. Multiplanar reformats and maximum intensity projections were generated. RADIATION DOSE REDUCTION: This exam was performed according to the departmental dose-optimization program which includes automated exposure control, adjustment of the mA and/or kV according to patient size and/or use of iterative reconstruction technique. CONTRAST:  OMNIPAQUE IOHEXOL 350 MG/ML SOLN COMPARISON:  09/15/2023 FINDINGS: Small left pleural effusion. Stable left basilar collapse and consolidation. Cardiac size within normal  limits. Small hiatal hernia. Gastroesophageal varices noted. Infiltrative changes surrounding the uncinate process of the pancreas extending into the mesenteric root appears slightly progressive since prior examination and may represent retroperitoneal edema or a superimposed inflammatory process suggest acute interstitial/edematous pancreatitis. Fatty atrophy of the pancreas again noted. Otherwise normal enhancement of the pancreatic parenchyma. The pancreatic duct is not dilated. No loculated peripancreatic fluid collections are identified. 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 Approximately 2/3 of the splenic parenchyma appears devascularized. Stable splenomegaly. The adrenal glands are unremarkable. The kidneys are normal. Bladder is unremarkable. Progressive infiltrative changes surround the third and fourth portion the duodenum which may  relate to retroperitoneal edema or inflammatory process following the uncinate process of the pancreas. Moderate ascites again noted, stable since prior examination. The stomach, small bowel, and large bowel are otherwise unremarkable. The appendix is not clearly identified, however, no secondary signs of appendicitis are seen within the right lower quadrant. Inferior vena cava filter is in place within the infrarenal cava, unchanged from prior examination. Previously noted caval thrombus has resolved. 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 Previously noted superimposed acute intraluminal thrombus, however, has resolved and the left common femoral, external iliac, and common iliac veins appear patent with inline flow. Small residual filling defect identified within the visualized left femoral vein in keeping with DVT in this location. Pelvic organs are  unremarkable. Diffuse subcutaneous body wall edema has progressed slightly in the interval since prior examination. Asymmetric circumferential subcutaneous edema within the left lower extremity is again identified likely related to impaired venous return given left femoral DVT and left external iliac venous stenosis. No acute bone abnormality.  No lytic or blastic bone lesion. IMPRESSION: 1. Interval resolution of previously noted caval thrombus. 2. Interval resolution of previously noted acute intraluminal thrombus within the left common femoral, external iliac, and common iliac veins. Inline flow reestablished though residual high-grade stenosis of the left external iliac vein, related to remote thrombosis, again noted. The 3. Small residual filling defect within the visualized left femoral vein in keeping with DVT in this location. 4. Infiltrative changes surrounding the uncinate process of the pancreas extending into the mesenteric root appears slightly progressive since prior examination and may represent retroperitoneal edema or a superimposed inflammatory process suggest acute interstitial/edematous pancreatitis. Correlation with serum lipase would be helpful in differentiating these entities. 5. Stable small left pleural effusion. Stable left basilar collapse and consolidation. 6. Stable moderate ascites. 7. Stable splenomegaly. 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 8. Diffuse subcutaneous body wall edema has progressed slightly in the interval since prior examination. Asymmetric circumferential subcutaneous edema within the left lower extremity is again identified likely related to impaired venous return given left femoral DVT and left external iliac venous stenosis. Electronically Signed   By: Helyn Numbers M.D.   On: 09/25/2023 20:39   CT Angio Chest PE W and/or Wo Contrast  Result Date: 09/25/2023 CLINICAL DATA:  Past medical history of  thrombocytopenia, anemia, hypersplenism status post mechanical thrombectomy on 09/16/2023. Now presenting with left leg swelling, warmth, pain, and shortness of breath. PE suspected. EXAM: CT ANGIOGRAPHY CHEST WITH CONTRAST TECHNIQUE: Multidetector CT imaging of the chest was performed using the standard protocol during bolus administration of intravenous contrast. Multiplanar CT image reconstructions and MIPs were obtained to evaluate the vascular anatomy. RADIATION DOSE REDUCTION: This exam was performed according to the departmental dose-optimization program which includes automated exposure control, adjustment of the mA and/or kV according to patient size and/or use of iterative reconstruction technique. CONTRAST:  OMNIPAQUE IOHEXOL 350 MG/ML SOLN COMPARISON:  None Available. FINDINGS: Cardiovascular: Acute segmental and subsegmental pulmonary emboli in the right upper lobe (series 4/image 105). There is evidence of right heart strain with RV: LV ratio of 1.2. There is mild flattening of the interventricular septum. No pericardial effusion. Mediastinum/Nodes: Mild wall thickening of the distal esophagus and paraesophageal varices. Trachea is unremarkable. No thoracic adenopathy. Lungs/Pleura: Consolidation in the left lower lobe may be due to  atelectasis or pneumonia and is similar to prior. Small left pleural effusion. No pneumothorax. Upper Abdomen: Nodular contour of the liver compatible with cirrhosis. Cholecystectomy. Upper abdominal ascites. Heterogenous enhancement of the spleen which is enlarged. Musculoskeletal: No acute fracture. Review of the MIP images confirms the above findings. IMPRESSION: 1. Acute segmental and subsegmental pulmonary emboli in the right upper lobe. Positive for acute PE with CT evidence of right heart strain (RV/LV Ratio = 1.2) consistent with at least submassive (intermediate risk) PE. The presence of right heart strain has been associated with an increased risk of  morbidity and mortality. Please refer to the "Code PE Focused" order set in EPIC. 2. Consolidation in the left lower lobe may be due to atelectasis or pneumonia. Small left pleural effusion. 3. Cirrhosis with upper abdominal ascites. See separate report for findings in the abdomen. 4. Mild wall thickening of the distal esophagus may be due to venous congestion or esophagitis. Paraesophageal varices. Critical Value/emergent results were called by telephone at the time of interpretation on 09/25/2023 at 8:36 pm to provider PA [facility], who verbally acknowledged these results. Electronically Signed   By: Minerva Fester M.D.   On: 09/25/2023 20:39   DG Chest Port 1 View  Result Date: 09/25/2023 CLINICAL DATA:  dyspnea EXAM: PORTABLE CHEST 1 VIEW COMPARISON:  September 14, 2023 FINDINGS: The cardiomediastinal silhouette is unchanged in contour. Trace residual LEFT pleural effusion. No pneumothorax. Favored overall improved aeration at the LEFT lung base with minimal linear opacity noted. IMPRESSION: Trace residual LEFT pleural effusion. No pneumothorax. Electronically Signed   By: Meda Klinefelter M.D.   On: 09/25/2023 13:46    Scheduled Meds:  ARIPiprazole  20 mg Oral Daily   famotidine  20 mg Oral Daily   insulin aspart  0-15 Units Subcutaneous TID WC   insulin aspart  0-5 Units Subcutaneous QHS   insulin detemir  45 Units Subcutaneous QHS   lisinopril  5 mg Oral Daily   nicotine  14 mg Transdermal Daily   pregabalin  150 mg Oral BID   QUEtiapine  25 mg Oral QHS   Continuous Infusions:  heparin 2,300 Units/hr (09/26/23 1310)     LOS: 1 day   Shon Hale M.D on 09/26/2023 at 4:55 PM  Go to [email or web address] - for contact info  [facility] - Office  [phone]  If 7PM-7AM, please contact night-coverage [email or web address] 09/26/2023, 4:55 PM

## 2023-09-26 NOTE — Assessment & Plan Note (Signed)
Continue Pepcid

## 2023-09-26 NOTE — Assessment & Plan Note (Signed)
-   Continue heparin drip

## 2023-09-26 NOTE — TOC Initial Note (Signed)
Transition of Care [facility]) - Initial/Assessment Note    Patient Details  Name: Jaime [name] MRN: [id number] Date of Birth: [birth year]  Transition of Care (TOC) CM/SW Contact:    Catalina Gravel, LCSW Phone Number: 09/26/2023, 1:49 PM  Clinical Narrative:                 Pt is high risk for readmission. CSW completed assessment with pt. Pt is mostly independent in completing her ADLs. Pt listed as married but lives alone in a single family home, there are about 5 stairs.  Pt drives and gets to personal and Dr. Algie Coffer independently. Pt does not use or need ant DME at home. PT attempted to evaluate pr, but unable to, evaluation pending.  TOC to follow.    Expected Discharge Plan: Skilled Nursing Facility Barriers to Discharge: Continued Medical Work up   Patient Goals and CMS Choice Patient states their goals for this hospitalization and ongoing recovery are:: Pt states that she would like inpatient rehab then home.          Expected Discharge Plan and Services In-house Referral: Clinical Social Work Discharge Planning Services: CM Consult   Living arrangements for the past 2 months: Single Family Home (Has 5 stairs.)                                      Prior Living Arrangements/Services Living arrangements for the past 2 months: Single Family Home (Has 5 stairs.) Lives with:: Self (Pt married, states that she lives alone.) Patient language and need for interpreter reviewed:: Yes Do you feel safe going back to the place where you live?: Yes      Need for Family Participation in Patient Care: Yes (Comment) (Pt states hse has friends and family if support needed.)        Activities of Daily Living   ADL Screening (condition at time of admission) Independently performs ADLs?: No Does the patient have a NEW difficulty with bathing/dressing/toileting/self-feeding that is expected to last >3 days?: No Does the patient have a NEW difficulty with getting  in/out of bed, walking, or climbing stairs that is expected to last >3 days?: Yes (Initiates electronic notice to provider for possible PT consult) Does the patient have a NEW difficulty with communication that is expected to last >3 days?: No Is the patient deaf or have difficulty hearing?: No Does the patient have difficulty seeing, even when wearing glasses/contacts?: No Does the patient have difficulty concentrating, remembering, or making decisions?: No  Permission Sought/Granted                  Emotional Assessment Appearance:: Appears stated age Attitude/Demeanor/Rapport: Gracious Affect (typically observed): Appropriate Orientation: : Oriented to Self, Oriented to Place, Oriented to Situation Alcohol / Substance Use: Not Applicable    Admission diagnosis:  Acute pulmonary embolism (HCC) [I26.99] Acute pulmonary embolism, unspecified pulmonary embolism type, unspecified whether acute cor pulmonale present (HCC) [I26.99] Patient Active Problem List   Diagnosis Date Noted   Acute pulmonary embolism (HCC) 09/25/2023   Elevated d-dimer 09/16/2023   Elevated brain natriuretic peptide (BNP) level 09/16/2023   Hypoalbuminemia due to protein-calorie malnutrition (HCC) 09/16/2023   Acute deep vein thrombosis (DVT) of left lower extremity (HCC) 09/15/2023   Left leg cellulitis 09/14/2023   Implanon insertion 04/28/2023   Lumbar compression fracture (HCC) 02/10/2023   Left leg DVT (HCC) 02/05/2023  At risk for arrhythmia 01/26/2023   Chronic back pain 01/01/2023   Insomnia 01/01/2023   Depression, recurrent (HCC) 01/01/2023   Constipation 05/18/2022   IBS (irritable bowel syndrome) 02/16/2022   Chronic prescription opiate use 02/16/2022   Gastroesophageal reflux disease 10/23/2021   Diarrhea 10/23/2021   Adjustment disorder with depressed mood 09/07/2020   Dyspnea 07/29/2020   Anasarca 05/24/2020   Hyperglycemia due to type 2 diabetes mellitus (HCC) 05/24/2020   Morbid  obesity (HCC) 05/24/2020   Elevated serum immunoglobulin free light chains 03/08/2020   Fatty liver 03/07/2020   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   Splenomegaly 03/07/2020   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   Pressure injury of skin 01/29/2019   Uncontrolled diabetes mellitus 09/19/2018   Hyperglycemia 09/17/2018   Acute respiratory failure with hypoxia (HCC) 09/15/2018   Anxiety 09/15/2018   Chronic abdominal pain 09/15/2018   Diabetes mellitus without complication (HCC) 09/15/2018   Hepatitis C 09/15/2018   Peptic ulcer 09/15/2018   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) 09/15/2018   Cirrhosis (HCC) 09/15/2018   Tobacco use disorder 05/22/2018   CAP (community acquired pneumonia) 07/17/2016   Chronic pain disorder 07/17/2016   Depression 05/24/2013   PCP:  Medicine, [facility]:   [facility] [address], [place] - [address] [place] [address] Phone: [phone] Fax: [phone]  [facility] - [place], [place] - [address] [place] [address] Phone: [phone] Fax: [phone]  [facility] [address] [place] [address] Phone: [phone] Fax: [phone]     Social Determinants of Health (SDOH) Social History: SDOH Screenings   Food Insecurity: No Food Insecurity (09/25/2023)  Housing: Low Risk  (09/25/2023)  Transportation Needs: No Transportation Needs (09/25/2023)  Utilities: Not At Risk (09/25/2023)  Alcohol Screen: Low Risk  (07/02/2023)  Depression (PHQ2-9): High Risk (09/14/2023)  Financial Resource Strain: Medium Risk (05/09/2020)  Physical Activity: Insufficiently Active (05/09/2020)  Social Connections: Unknown (03/04/2022)   [facility]  Stress: Stress Concern Present (05/09/2020)  Tobacco Use: High Risk (09/25/2023)   SDOH Interventions:     Readmission  Risk Interventions    09/26/2023    1:41 PM  Readmission Risk Prevention Plan  Transportation Screening Complete  Medication Review (RN Care Manager) Complete  PCP or Specialist appointment within 3-5 days of discharge Complete  HRI or Home Care Consult Complete  SW Recovery Care/Counseling Consult Complete  Palliative Care Screening Not Applicable  Skilled Nursing Facility Not Applicable

## 2023-09-26 NOTE — Consult Note (Signed)
PHARMACY - ANTICOAGULATION CONSULT NOTE  Pharmacy Consult for Heparin Infusion Indication: pulmonary embolus  Allergies  Allergen Reactions   Firvanq [Vancomycin] Swelling    Facial swelling   Iodine-131 Anaphylaxis, Shortness Of Breath and Swelling   Ivp Dye [Iodinated Contrast Media] Anaphylaxis    Skin redness Difficulty walking   Toradol [Ketorolac Tromethamine] Shortness Of Breath    Flares ulcers   Tylenol [Acetaminophen] Other (See Comments)    Hx of cirrhosis    Neurontin [Gabapentin] Itching   Nsaids Other (See Comments)    Flares ulcers   Suboxone [Buprenorphine Hcl-Naloxone Hcl] Other (See Comments)    Withdrawal symptoms     Patient Measurements: Height: 5\' 9"  (175.3 cm) Weight: 114.6 kg (252 lb 10.4 oz) IBW/kg (Calculated) : 66.2 Heparin Dosing Weight: 91.9 kg  Vital Signs: Temp: 98.2 F (36.8 C) (11/24 1345) Temp Source: Oral (11/24 1345) BP: 107/57 (11/24 1345) Pulse Rate: 109 (11/24 1345)  Labs: Recent Labs    09/25/23 1330 09/25/23 1554 09/25/23 2113 09/25/23 2113 09/26/23 0344 09/26/23 1122 09/26/23 1808  HGB 10.4*  --   --   --  10.5*  --   --   HCT 32.6*  --   --   --  33.0*  --   --   PLT 116*  --   --   --  122*  --   --   APTT  --   --  27  --   --   --   --   LABPROT  --   --  14.3  --   --   --   --   INR  --   --  1.1  --   --   --   --   HEPARINUNFRC  --   --  <0.10*   < > <0.10* <0.10* 0.63  CREATININE 0.39*  --  0.46  --  0.48  --   --   TROPONINIHS 3 2  --   --   --   --   --    < > = values in this interval not displayed.    Estimated Creatinine Clearance: 127.6 mL/min (by C-G formula based on SCr of 0.48 mg/dL).   Medical History: Past Medical History:  Diagnosis Date   Acute encephalopathy 08/31/2020   Adjustment disorder with depressed mood 09/07/2020   Anxiety    Asthma    Chronic abdominal pain    Chronic back pain    Chronic prescription opiate use 02/16/2022   Cirrhosis (HCC)    Cirrhosis (HCC)    COPD  (chronic obstructive pulmonary disease) (HCC)    Depression    Diabetes mellitus without complication (HCC)    Diabetes mellitus, type II (HCC)    Diverticulosis    DVT (deep venous thrombosis) (HCC) 02/04/2023   taking lovenox prior to procedure   Fatty liver 03/07/2020   GERD (gastroesophageal reflux disease)    Hepatitis C    Hernia, abdominal    Hyperglycemia due to type 2 diabetes mellitus (HCC) 05/24/2020   Hyperlipidemia    IBS (irritable bowel syndrome) 02/16/2022   Insomnia    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 female presenting with SOB. 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 Patient was on [facility] PTA per chart review. Of note pt states that she is compliant with apixaban but baseline anti-Xa level was undetectable, which could indicate that it had been a few days since pt took the medication. Pharmacy has been consulted to initiate and manage heparin infusion.   Baseline Labs: aPTT 27, HL <0.10, PT 14.3, INR 1.1, Hgb 10.4, Hct 32.6, Plt 116   Goal of Therapy:  Heparin level 0.3-0.7 units/ml Monitor platelets by anticoagulation protocol: Yes   Date Time HL Rate/Comment  11/24 0344 <0.10 1450/subtherapeutic 11/24 1122 <0.10 1900/subtherapeutic 11/24 1808 0.68 2300/therapeutic x1  Plan:  Continue heparin infusion at 2300 units/hr Check HL in 6 hours  Continue to monitor H&H and platelets daily while on heparin infusion   Celene Squibb, PharmD Clinical Pharmacist 09/26/2023 7:15 PM

## 2023-09-26 NOTE — H&P (Signed)
History and Physical    Patient: Jaime [name] GEX:[id number] DOB: 09-14-1986 DOA: 09/25/2023 DOS: the patient was seen and examined on 09/26/2023 PCP: [facility], [facility]  Patient coming from: Home  Chief Complaint:  Chief Complaint  Patient presents with   Leg Pain   HPI: Jaime [name] is a 37 y.o. female with medical history significant of depression, anxiety, cirrhosis, COPD, diabetes mellitus with hyperglycemia, DVT, GERD, hep C, hyperlipidemia, tobacco use disorder, and more presents the ED with a chief complaint of left leg pain.  Patient reports that she was diagnosed with a blood clot in April in that leg.  It looks like she was also diagnosed earlier this month.  She reports in April she was on Lovenox for 3 months and then switched to Eliquis.  She has not been on warfarin or Coumadin.  Patient reports she has had missed doses of Eliquis.  She thinks this happens once or twice per week due to her forgetting the medication.  She has associated shortness of breath.  That also started 1 week ago.  She has no fevers.  Patient reports she does have chest pain in the center of her chest.  It is a stabbing type of pain.  It is worse with exertion.  Worse with inspiration.  Patient reports history of blood clots.  She does not have a clotting disorder that she knows of.  She reports she had Nexplanon placed earlier this year and is planning for removal soon.  She thinks Nexplanon is related to her blood clots.  Patient has had nausea, vomiting, diarrhea.  It happened 4 times per day.  There is been no hematemesis.  She reports her last meal was the day prior to presentation.  She threw it back up and it was undigested food.  Patient reports her pain also extends to where her spleen is.  She reports she had a filter placed there and she is sore.  Patient has no other complaints at this time.  She is full code. Review of Systems: As mentioned in the history of present illness. All  other systems reviewed and are negative. Past Medical History:  Diagnosis Date   Acute encephalopathy 08/31/2020   Adjustment disorder with depressed mood 09/07/2020   Anxiety    Asthma    Chronic abdominal pain    Chronic back pain    Chronic prescription opiate use 02/16/2022   Cirrhosis (HCC)    Cirrhosis (HCC)    COPD (chronic obstructive pulmonary disease) (HCC)    Depression    Diabetes mellitus without complication (HCC)    Diabetes mellitus, type II (HCC)    Diverticulosis    DVT (deep venous thrombosis) (HCC) 02/04/2023   taking lovenox prior to procedure   Fatty liver 03/07/2020   GERD (gastroesophageal reflux disease)    Hepatitis C    Hernia, abdominal    Hyperglycemia due to type 2 diabetes mellitus (HCC) 05/24/2020   Hyperlipidemia    IBS (irritable bowel syndrome) 02/16/2022   Insomnia    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)    Lupus    Migraine headache    Morbid obesity (HCC) 05/24/2020   Neuropathy    Nocturnal seizures (HCC)    Pain of upper abdomen 07/28/2021   Peptic ulcer    Spleen enlarged    Splenomegaly    Past Surgical History:  Procedure Laterality Date   BIOPSY  11/25/2021   Procedure: BIOPSY;  Surgeon: Marguerita Merles,  Reuel Boom, MD;  [facility]: [facility];  Service: Gastroenterology;;   CHOLECYSTECTOMY     COLONOSCOPY WITH PROPOFOL N/A 11/25/2021   Procedure: COLONOSCOPY WITH PROPOFOL;  Surgeon: Dolores Frame, MD;  Location: [facility];  Service: Gastroenterology;  Laterality: N/A;  805   COLONOSCOPY WITH PROPOFOL N/A 03/31/2022   Procedure: COLONOSCOPY WITH PROPOFOL;  Surgeon: Dolores Frame, MD;  Location: [facility];  Service: Gastroenterology;  Laterality: N/A;  730   ESOPHAGOGASTRODUODENOSCOPY  06/2020   done at [facility], candida in upper esophagus (treated with diflucan), ulcerative esophagitis at GE junction, gastritis in stomach, single ulcer in duodenal bulb, with duodenal mucosa  showing no abnormality. No presence of varices   ESOPHAGOGASTRODUODENOSCOPY (EGD) WITH PROPOFOL N/A 11/25/2021   Procedure: ESOPHAGOGASTRODUODENOSCOPY (EGD) WITH PROPOFOL;  Surgeon: Dolores Frame, MD;  Location: [facility];  Service: Gastroenterology;  Laterality: N/A;   ESOPHAGOGASTRODUODENOSCOPY (EGD) WITH PROPOFOL N/A 03/31/2022   Procedure: ESOPHAGOGASTRODUODENOSCOPY (EGD) WITH PROPOFOL;  Surgeon: Dolores Frame, MD;  Location: [facility];  Service: Gastroenterology;  Laterality: N/A;   IVC FILTER REMOVAL N/A 09/16/2023   Procedure: IVC FILTER REMOVAL;  Surgeon: [name], MD;  Location: [facility] INVASIVE CV LAB;  Service: Cardiovascular;  Laterality: N/A;   PERIPHERAL VASCULAR THROMBECTOMY Left 09/16/2023   Procedure: PERIPHERAL VASCULAR THROMBECTOMY;  Surgeon: [name], MD;  Location: [facility] INVASIVE CV LAB;  Service: Cardiovascular;  Laterality: Left;   Social History:  reports that she has been smoking cigarettes. She has been exposed to tobacco smoke. She has never used smokeless tobacco. She reports that she does not drink alcohol and does not use drugs.  Allergies  Allergen Reactions   Iodine-131 Anaphylaxis, Shortness Of Breath and Swelling   Ivp Dye [Iodinated Contrast Media] Anaphylaxis    Skin gets very red, unable to walk    Ketorolac Tromethamine Shortness Of Breath   Tylenol [Acetaminophen] Other (See Comments)    Due to cirrhosis    Vancomycin Swelling    Facial swelling,    Gabapentin Itching   Nsaids Other (See Comments)    Flares ulcers   Suboxone [Buprenorphine Hcl-Naloxone Hcl] Other (See Comments)    Withdrawal symptoms     Family History  Problem Relation Age of Onset   Hyperlipidemia Maternal Grandfather     Prior to Admission medications   Medication Sig Start Date End Date Taking? Authorizing Provider  albuterol (VENTOLIN HFA) 108 (90 Base) MCG/ACT inhaler Inhale 2 puffs into the lungs every 6 (six) hours as needed for  wheezing or shortness of breath. 07/17/22   Leath-Warren, Sadie Haber, NP  amoxicillin-clavulanate (AUGMENTIN) 875-125 MG tablet Take 1 tablet by mouth 2 (two) times daily. 09/17/23   Burnadette Pop, MD  apixaban (ELIQUIS) 5 MG TABS tablet Take 1 tablet (5 mg total) by mouth 2 (two) times daily. 09/17/23   Burnadette Pop, MD  ARIPiprazole (ABILIFY) 20 MG tablet Take 20 mg by mouth daily. 08/02/23   [provider]  cetirizine (ZYRTEC) 10 MG tablet Take 10 mg by mouth daily.    [provider]  clonazePAM (KLONOPIN) 0.5 MG tablet Take 0.5 mg by mouth 2 (two) times daily as needed for anxiety. 08/02/23   [provider]  Continuous Glucose Transmitter (DEXCOM G6 TRANSMITTER) MISC USE AS DIRECTED. 03/24/23   Dani Gobble, NP  cyclobenzaprine (FLEXERIL) 10 MG tablet Take 1 tablet (10 mg total) by mouth 3 (three) times daily as needed for muscle spasms. Patient taking differently: Take  10 mg by mouth as needed for muscle spasms. 01/26/23   Del Nigel Berthold, FNP  enoxaparin (LOVENOX) 100 MG/ML injection Inject 1 mL (100 mg total) into the skin every 12 (twelve) hours. 09/13/23   Carnella Guadalajara, PA-C  famotidine (PEPCID) 20 MG tablet Take 1 tablet (20 mg total) by mouth 2 (two) times daily. Patient taking differently: Take 20 mg by mouth daily. 01/07/23   Rondel Baton, MD  ferrous sulfate 325 (65 FE) MG EC tablet Take 1 tablet (325 mg total) by mouth daily with breakfast. 09/13/23   Rojelio Brenner M, PA-C  glipiZIDE (GLUCOTROL XL) 5 MG 24 hr tablet take 1 tablet by mouth daily with breakfast. 08/10/23   Dani Gobble, NP  glucose blood (ACCU-CHEK GUIDE) test strip Use as instructed to check blood glucose four times daily 12/01/21   Dani Gobble, NP  hydrOXYzine (VISTARIL) 50 MG capsule Take 1 capsule (50 mg total) by mouth 3 (three) times daily as needed. Patient taking differently: Take 50 mg by mouth 3 (three) times daily as needed for anxiety.  05/25/23   Del Newman Nip, Tenna Child, FNP  hyoscyamine (ANASPAZ) 0.125 MG TBDP disintergrating tablet TAKE 1 TABLET UNDER THE TONGUE THREE TIMES A DAY AS NEEDED FOR BLADDER SPASMS OR CRAMPING. Patient taking differently: Place 0.125 mg under the tongue in the morning, at noon, and at bedtime. 05/10/23   Carlan, Chelsea L, NP  Insulin Pen Needle (PEN NEEDLES) 32G X 6 MM MISC 1 each by Does not apply route 3 (three) times daily. 02/26/21   Dani Gobble, NP  insulin regular human CONCENTRATED (HUMULIN R U-500 KWIKPEN) 500 UNIT/ML KwikPen Inject 90 Units into the skin 3 (three) times daily with meals. 03/24/23   Dani Gobble, NP  lidocaine (XYLOCAINE) 5 % ointment Apply 1 Application topically as needed. Patient taking differently: Apply 1 Application topically as needed for mild pain (pain score 1-3) or moderate pain (pain score 4-6). 04/27/23   Del Newman Nip, Tenna Child, FNP  LINZESS 290 MCG CAPS capsule Take 290 mcg by mouth daily. 10/16/22   [provider]  lisinopril (ZESTRIL) 5 MG tablet Take 5 mg by mouth daily.    [provider]  magic mouthwash (nystatin, hydrocortisone, diphenhydrAMINE, lidocaine) suspension Swish and swallow 5 mLs 3 (three) times daily as needed for mouth pain. 09/25/23   Leath-Warren, Sadie Haber, NP  metFORMIN (GLUCOPHAGE) 1000 MG tablet take 1 tablet by mouth 2 times a day with a meal. Patient taking differently: Take 1,000 mg by mouth 2 (two) times daily with a meal. 08/10/23   Dani Gobble, NP  methadone (DOLOPHINE) 10 MG/ML solution Take 65 mg by mouth daily.    [provider]  methocarbamol (ROBAXIN) 500 MG tablet Take 500 mg by mouth 2 (two) times daily. 06/15/23   [provider]  omeprazole (PRILOSEC) 40 MG capsule TAKE 1 CAPSULE BY MOUTH TWICE DAILY. 05/10/23   Carlan, Chelsea L, NP  ondansetron (ZOFRAN) 4 MG tablet Take 1 tablet (4 mg total) by mouth every 8 (eight) hours as needed for nausea or vomiting. 03/08/23   Carlan,  Chelsea L, NP  Oxycodone HCl 10 MG TABS Take 10 mg by mouth 4 (four) times daily as needed (pain). 09/03/23   [provider]  potassium chloride (KLOR-CON) 10 MEQ tablet Take 10 mEq by mouth 2 (two) times daily. 08/10/23   [provider]  pregabalin (LYRICA) 75 MG capsule Take 2 capsules by mouth  2 (two) times daily. 09/03/23 10/03/23  [provider]  propranolol (INDERAL) 10 MG tablet Take 10 mg by mouth 3 (three) times daily. 08/02/23   [provider]  QUEtiapine (SEROQUEL) 25 MG tablet Take 25 mg by mouth at bedtime.    [provider]  torsemide (DEMADEX) 20 MG tablet Take 20 mg by mouth 2 (two) times daily. 09/14/23   [provider]  venlafaxine XR (EFFEXOR XR) 75 MG 24 hr capsule Take 1 capsule (75 mg total) by mouth daily with breakfast. 05/25/23   Del Newman Nip, Tenna Child, FNP  Vitamin D, Ergocalciferol, (DRISDOL) 1.25 MG (50000 UNIT) CAPS capsule Take 1 capsule (50,000 Units total) by mouth every 7 (seven) days. Patient taking differently: Take 50,000 Units by mouth once a week. 12/10/21   Dani Gobble, NP    Physical Exam: Vitals:   09/25/23 2200 09/25/23 2227 09/25/23 2323 09/26/23 0417  BP: 130/70  125/86 124/82  Pulse: (!) 105 (!) 102 (!) 108 (!) 105  Resp: (!) 23 17 18 20   Temp:   97.9 F (36.6 C) 97.9 F (36.6 C)  TempSrc:   Oral   SpO2: 96% 100% 97% 93%  Weight:   114.6 kg   Height:   5\' 9"  (1.753 m)    1.  General: Patient lying supine in bed,  no acute distress   2. Psychiatric: Alert and oriented x 3, mood and behavior normal for situation, pleasant and cooperative with exam   3. Neurologic: Speech and language are normal, face is symmetric, moves all 4 extremities voluntarily, at baseline without acute deficits on limited exam   4. HEENMT:  Head is atraumatic, normocephalic, pupils reactive to light, neck is supple, trachea is midline, mucous membranes are moist   5. Respiratory : Lungs are clear to  auscultation bilaterally without wheezing, rhonchi, rales, no cyanosis, no increase in work of breathing or accessory muscle use   6. Cardiovascular : Heart rate normal, rhythm is regular, no murmurs, rubs or gallops, peripheral edema left greater than right, peripheral pulses palpated   7. Gastrointestinal:  Abdomen is soft, nondistended, nontender to palpation bowel sounds active, no masses or organomegaly palpated   8. Skin:  Skin is warm, dry and intact without rashes, acute lesions, or ulcers on limited exam   9.Musculoskeletal:  No acute deformities or trauma, no asymmetry in tone, significant peripheral edema on the left compared to the right, peripheral pulses palpated, no tenderness to palpation in the extremities  Data Reviewed: In the ED labs shows a hemoglobin of 10.4, hyperglycemia of 306 Negative COVID and flu Troponin 2 D-dimer elevated CTA chest shows segmental and subsegmental pulmonary emboli in the right upper lobe Patient also has esophagitis on the CT scan Patient was started on heparin drip and admission was requested for DVT/PE Assessment and Plan: * Acute pulmonary embolism (HCC) - With history of blood clots - Acute segmental and subsegmental pulmonary emboli in the right upper lobe positive for PE - Patient was on Eliquis at home but reports missed doses - Pharmacy confirms missed doses with lab anti-- Xa - Currently on heparin drip - Patient was recently discharged from the hospital November 15 for acute on chronic DVT.  At that time she had an elevated D-dimer and recurrent left lower extremity DVT.  She had an IVC filter thrombus and was started on heparin drip at that time.  Vascular surgery did mechanical thrombectomy.  She was discharged on Eliquis - Echo in the  a.m. to assess for right heart strain  Acute deep vein thrombosis (DVT) of left lower extremity (HCC) - Continue heparin drip  Tobacco use disorder - Smokes half a pack per day - Continue  nicotine patch  Hyperglycemia due to type 2 diabetes mellitus (HCC) - Glucose 306 - Patient takes 90 units of basal insulin at baseline - Continue reduced dose of basal insulin - Sliding scale coverage - Continue to monitor  Gastroesophageal reflux disease - Continue Pepcid  Cirrhosis (HCC) As seen on CT - Closely monitor Seroquel  Acute respiratory failure with hypoxia (HCC) - Reported requiring nasal cannula in the ED after pain medication - Acute PE likely also contributing - Patient weaned off O2 at the time of my exam - Continue to monitor      Advance Care Planning:   Code Status: Full Code  Consults: None at this time  Family Communication: No family at bedside  Severity of Illness: The appropriate patient status for this patient is INPATIENT. Inpatient status is judged to be reasonable and necessary in order to provide the required intensity of service to ensure the patient's safety. The patient's presenting symptoms, physical exam findings, and initial radiographic and laboratory data in the context of their chronic comorbidities is felt to place them at high risk for further clinical deterioration. Furthermore, it is not anticipated that the patient will be medically stable for discharge from the hospital within 2 midnights of admission.   * I certify that at the point of admission it is my clinical judgment that the patient will require inpatient hospital care spanning beyond 2 midnights from the point of admission due to high intensity of service, high risk for further deterioration and high frequency of surveillance required.*  Author: Lilyan Gilford, DO 09/26/2023 6:15 AM  For on call review [email or web address].

## 2023-09-26 NOTE — Progress Notes (Signed)
*  PRELIMINARY RESULTS* Echocardiogram 2D Echocardiogram has been performed.  Stacey Drain 09/26/2023, 11:42 AM

## 2023-09-26 NOTE — Assessment & Plan Note (Signed)
-   Glucose 306 - Patient takes 90 units of basal insulin at baseline - Continue reduced dose of basal insulin - Sliding scale coverage - Continue to monitor

## 2023-09-26 NOTE — Therapy (Signed)
Nursing just put patient on purewick and requests to not get patient up; will attempt eval again tomorrow.  7:40 AM, 09/26/23 Earlene Bjelland Small Letica Giaimo MPT [facility] [place] [phone]

## 2023-09-26 NOTE — Progress Notes (Signed)
PHARMACY - ANTICOAGULATION CONSULT NOTE  Pharmacy Consult for heparin Indication: pulmonary embolus  Labs: Recent Labs    09/25/23 1330 09/25/23 1554 09/25/23 2113 09/26/23 0344  HGB 10.4*  --   --   --   HCT 32.6*  --   --   --   PLT 116*  --   --   --   APTT  --   --  27  --   LABPROT  --   --  14.3  --   INR  --   --  1.1  --   HEPARINUNFRC  --   --  <0.10* <0.10*  CREATININE 0.39*  --  0.46 0.48  TROPONINIHS 3 2  --   --    Assessment: 37yo female subtherapeutic on heparin with initial dosing for PE; no infusion issues or signs of bleeding per RN.  Of note pt states that she is compliant with apixaban but baseline anti-Xa level was undetectable, which could indicate that it had been a few days since pt took the medication.  Goal of Therapy:  Heparin level 0.3-0.7 units/ml   Plan:  4000 units heparin bolus. Increase heparin infusion by 4 units/kg/hr to 1900 units/hr. Check level in 6 hours.   Vernard Gambles, PharmD, BCPS 09/26/2023 5:13 AM

## 2023-09-26 NOTE — Assessment & Plan Note (Signed)
As seen on CT - Closely monitor Seroquel

## 2023-09-26 NOTE — Assessment & Plan Note (Addendum)
-   With history of blood clots - Acute segmental and subsegmental pulmonary emboli in the right upper lobe positive for PE - Patient was on Eliquis at home but reports missed doses - Pharmacy confirms missed doses with lab anti-- Xa - Currently on heparin drip - Patient was recently discharged from the hospital November 15 for acute on chronic DVT.  At that time she had an elevated D-dimer and recurrent left lower extremity DVT.  She had an IVC filter thrombus and was started on heparin drip at that time.  Vascular surgery did mechanical thrombectomy.  She was discharged on Eliquis - Echo in the a.m. to assess for right heart strain

## 2023-09-26 NOTE — Assessment & Plan Note (Signed)
-   Reported requiring nasal cannula in the ED after pain medication - Acute PE likely also contributing - Patient weaned off O2 at the time of my exam - Continue to monitor

## 2023-09-26 NOTE — Progress Notes (Signed)
PHARMACY - ANTICOAGULATION CONSULT NOTE  Pharmacy Consult for heparin Indication: pulmonary embolus  Labs: Recent Labs    09/25/23 1330 09/25/23 1554 09/25/23 2113 09/26/23 0344  HGB 10.4*  --   --  10.5*  HCT 32.6*  --   --  33.0*  PLT 116*  --   --  122*  APTT  --   --  27  --   LABPROT  --   --  14.3  --   INR  --   --  1.1  --   HEPARINUNFRC  --   --  <0.10* <0.10*  CREATININE 0.39*  --  0.46 0.48  TROPONINIHS 3 2  --   --    Assessment: 37yo female subtherapeutic on heparin with initial dosing for PE; no infusion issues or signs of bleeding per RN.  Of note pt states that she is compliant with apixaban but baseline anti-Xa level was undetectable, which could indicate that it had been a few days since pt took the medication.  Heparin level still undetectable. No bleeding or IV issues noted. Will rebolus and increase rate.   Goal of Therapy:  Heparin level 0.3-0.7 units/ml   Plan:  Rebolus 4000 units Increase heparin to 2300 units/hr Recheck heparin level in 6 hours - consider changing to lovenox if heparin is still undetectable  Sheppard Coil PharmD., BCPS Clinical Pharmacist 09/26/2023 9:05 AM

## 2023-09-26 NOTE — Assessment & Plan Note (Signed)
-   Smokes half a pack per day - Continue nicotine patch

## 2023-09-27 ENCOUNTER — Encounter: Payer: MEDICAID | Admitting: Women's Health

## 2023-09-27 DIAGNOSIS — I2699 Other pulmonary embolism without acute cor pulmonale: Secondary | ICD-10-CM | POA: Diagnosis not present

## 2023-09-27 DIAGNOSIS — K219 Gastro-esophageal reflux disease without esophagitis: Secondary | ICD-10-CM | POA: Diagnosis not present

## 2023-09-27 DIAGNOSIS — F17200 Nicotine dependence, unspecified, uncomplicated: Secondary | ICD-10-CM | POA: Diagnosis not present

## 2023-09-27 DIAGNOSIS — E1165 Type 2 diabetes mellitus with hyperglycemia: Secondary | ICD-10-CM | POA: Diagnosis not present

## 2023-09-27 LAB — CBC
HCT: 35.2 % — ABNORMAL LOW (ref 36.0–46.0)
Hemoglobin: 10.5 g/dL — ABNORMAL LOW (ref 12.0–15.0)
MCH: 28.3 pg (ref 26.0–34.0)
MCHC: 29.8 g/dL — ABNORMAL LOW (ref 30.0–36.0)
MCV: 94.9 fL (ref 80.0–100.0)
Platelets: 121 10*3/uL — ABNORMAL LOW (ref 150–400)
RBC: 3.71 MIL/uL — ABNORMAL LOW (ref 3.87–5.11)
RDW: 15.6 % — ABNORMAL HIGH (ref 11.5–15.5)
WBC: 6.4 10*3/uL (ref 4.0–10.5)
nRBC: 0 % (ref 0.0–0.2)

## 2023-09-27 LAB — BASIC METABOLIC PANEL
Anion gap: 6 (ref 5–15)
BUN: 6 mg/dL (ref 6–20)
CO2: 26 mmol/L (ref 22–32)
Calcium: 7.9 mg/dL — ABNORMAL LOW (ref 8.9–10.3)
Chloride: 104 mmol/L (ref 98–111)
Creatinine, Ser: 0.51 mg/dL (ref 0.44–1.00)
GFR, Estimated: >60 mL/min (ref >60)
Glucose, Bld: 173 mg/dL — ABNORMAL HIGH (ref 70–99)
Potassium: 4 mmol/L (ref 3.5–5.1)
Sodium: 136 mmol/L (ref 135–145)

## 2023-09-27 LAB — HEPARIN LEVEL (UNFRACTIONATED)
Heparin Unfractionated: 0.71 [IU]/mL — ABNORMAL HIGH (ref 0.30–0.70)
Heparin Unfractionated: 0.73 [IU]/mL — ABNORMAL HIGH (ref 0.30–0.70)

## 2023-09-27 LAB — GLUCOSE, CAPILLARY
Glucose-Capillary: 172 mg/dL — ABNORMAL HIGH (ref 70–99)
Glucose-Capillary: 160 mg/dL — ABNORMAL HIGH (ref 70–99)
Glucose-Capillary: 220 mg/dL — ABNORMAL HIGH (ref 70–99)
Glucose-Capillary: 289 mg/dL — ABNORMAL HIGH (ref 70–99)

## 2023-09-27 MED ORDER — TRAZODONE HCL 50 MG PO TABS
25.0000 mg | ORAL_TABLET | Freq: Every evening | ORAL | Status: DC | PRN
  Administered 2023-09-27: 25 mg via ORAL
  Filled 2023-09-27: qty 1

## 2023-09-27 MED ORDER — APIXABAN 5 MG PO TABS
10.0000 mg | ORAL_TABLET | Freq: Two times a day (BID) | ORAL | Status: DC
  Administered 2023-09-27 – 2023-09-28 (×2): 10 mg via ORAL
  Filled 2023-09-27 (×3): qty 2

## 2023-09-27 MED ORDER — CLONAZEPAM 0.5 MG PO TABS
0.5000 mg | ORAL_TABLET | Freq: Two times a day (BID) | ORAL | Status: DC | PRN
  Administered 2023-09-27 – 2023-09-28 (×2): 0.5 mg via ORAL
  Filled 2023-09-27 (×2): qty 1

## 2023-09-27 MED ORDER — APIXABAN 5 MG PO TABS: 5.0000 mg | ORAL_TABLET | Freq: Two times a day (BID) | ORAL | Status: DC

## 2023-09-27 NOTE — Plan of Care (Signed)

## 2023-09-27 NOTE — Progress Notes (Signed)
Patient reported to me that she had a bloody BM. This RN did not get to see it. I notified MD and her instructed me to hold the 2200 scheduled dose of eliquis.

## 2023-09-27 NOTE — Progress Notes (Signed)
SATURATION QUALIFICATIONS: (This note is used to comply with regulatory documentation for home oxygen)  Patient Saturations on Room Air at Rest = 95%  Patient Saturations on Room Air while Ambulating = 94%  Please briefly explain why patient needs home oxygen: Patient reported no complaints of shortness of breath while ambulating. MD Gwenlyn Perking made aware.

## 2023-09-27 NOTE — Evaluation (Signed)
Physical Therapy Evaluation Patient Details Name: Jaime [name] MRN: [id number] DOB: 01-Jun-1986 Today's Date: 09/27/2023  History of Present Illness  Jaime [name] is a 37 y.o. female with medical history significant of depression, anxiety, cirrhosis, COPD, diabetes mellitus with hyperglycemia, DVT, GERD, hep C, hyperlipidemia, tobacco use disorder, and more presents the ED with a chief complaint of left leg pain.  Patient reports that she was diagnosed with a blood clot in April in that leg.  It looks like she was also diagnosed earlier this month.  She reports in April she was on Lovenox for 3 months and then switched to Eliquis.  She has not been on warfarin or Coumadin.  Patient reports she has had missed doses of Eliquis.  She thinks this happens once or twice per week due to her forgetting the medication.  She has associated shortness of breath.  That also started 1 week ago.  She has no fevers.  Patient reports she does have chest pain in the center of her chest.  It is a stabbing type of pain.  It is worse with exertion.  Worse with inspiration.  Patient reports history of blood clots.  She does not have a clotting disorder that she knows of.  She reports she had Nexplanon placed earlier this year and is planning for removal soon.  She thinks Nexplanon is related to her blood clots.  Patient has had nausea, vomiting, diarrhea.  It happened 4 times per day.  There is been no hematemesis.  She reports her last meal was the day prior to presentation.  She threw it back up and it was undigested food.  Patient reports her pain also extends to where her spleen is.  She reports she had a filter placed there and she is sore.  Patient has no other complaints at this time.  She is full code.   Clinical Impression  Patient functioning near baseline for functional mobility and gait demonstrating good return for ambulating in room/hallway without loss of balance, mostly limited due to LLE pain/discomfort  and tolerated sitting up in chair after therapy.  Patient encouraged to ambulate ad lib in room and with nursing staff/mobility tech for length of stay.  Plan:  Patient discharged from physical therapy to care of nursing for ambulation daily as tolerated for length of stay.          If plan is discharge home, recommend the following: A little help with bathing/dressing/bathroom;Help with stairs or ramp for entrance;Assistance with cooking/housework;A little help with walking and/or transfers   Can travel by private vehicle        Equipment Recommendations None recommended by PT  Recommendations for Other Services       Functional Status Assessment Patient has had a recent decline in their functional status and demonstrates the ability to make significant improvements in function in a reasonable and predictable amount of time.     Precautions / Restrictions Precautions Precautions: None Restrictions Weight Bearing Restrictions: No      Mobility  Bed Mobility Overal bed mobility: Modified Independent             General bed mobility comments: HOB raised    Transfers Overall transfer level: Independent                      Ambulation/Gait Ambulation/Gait assistance: Modified independent (Device/Increase time), Supervision Gait Distance (Feet): 100 Feet Assistive device: None Gait Pattern/deviations: WFL(Within Functional Limits) Gait velocity: decreased  General Gait Details: grossly WFL with good return for ambulating in room, hallway without loss of balance, limied mostly due to LLE pain/discomfort  Stairs            Wheelchair Mobility     Tilt Bed    Modified Rankin (Stroke Patients Only)       Balance Overall balance assessment: No apparent balance deficits (not formally assessed)                                           Pertinent Vitals/Pain Pain Assessment Pain Assessment: 0-10 Pain Score: 9  Pain  Location: LLE Pain Descriptors / Indicators: Sore, Discomfort Pain Intervention(s): Limited activity within patient's tolerance, Monitored during session, Repositioned    Home Living Family/patient expects to be discharged to:: Private residence Living Arrangements: Alone Available Help at Discharge: Family;Available 24 hours/day Type of Home: House Home Access: Stairs to enter Entrance Stairs-Rails: Right;Left;Can reach both Entrance Stairs-Number of Steps: 5   Home Layout: One level Home Equipment: Tub bench;BSC/3in1      Prior Function Prior Level of Function : Driving;Independent/Modified Independent             Mobility Comments: Community ambulator without AD, drives, shops ADLs Comments: Independent     Extremity/Trunk Assessment   Upper Extremity Assessment Upper Extremity Assessment: Overall WFL for tasks assessed    Lower Extremity Assessment Lower Extremity Assessment: Overall WFL for tasks assessed;LLE deficits/detail LLE Deficits / Details: grossly -4/5 LLE: Unable to fully assess due to pain LLE Sensation: decreased light touch LLE Coordination: WNL    Cervical / Trunk Assessment Cervical / Trunk Assessment: Normal  Communication   Communication Communication: No apparent difficulties  Cognition Arousal: Alert Behavior During Therapy: WFL for tasks assessed/performed Overall Cognitive Status: Within Functional Limits for tasks assessed                                          General Comments      Exercises     Assessment/Plan    PT Assessment All further PT needs can be met in the next venue of care  PT Problem List Decreased strength;Decreased activity tolerance;Decreased balance;Decreased mobility       PT Treatment Interventions      PT Goals (Current goals can be found in the Care Plan section)  Acute Rehab PT Goals Patient Stated Goal: return home with family to assist PT Goal Formulation: With patient Time  For Goal Achievement: 09/27/23 Potential to Achieve Goals: Good    Frequency       Co-evaluation               AM-PAC PT "6 Clicks" Mobility  Outcome Measure Help needed turning from your back to your side while in a flat bed without using bedrails?: None Help needed moving from lying on your back to sitting on the side of a flat bed without using bedrails?: None Help needed moving to and from a bed to a chair (including a wheelchair)?: None Help needed standing up from a chair using your arms (e.g., wheelchair or bedside chair)?: None Help needed to walk in hospital room?: A Little Help needed climbing 3-5 steps with a railing? : A Little 6 Click Score: 22    End of Session  Activity Tolerance: Patient tolerated treatment well;Patient limited by fatigue Patient left: in chair;with call bell/phone within reach Nurse Communication: Mobility status PT Visit Diagnosis: Unsteadiness on feet (R26.81);Other abnormalities of gait and mobility (R26.89);Muscle weakness (generalized) (M62.81)    Time: 1029-1050 PT Time Calculation (min) (ACUTE ONLY): 21 min   Charges:   PT Evaluation $PT Eval Moderate Complexity: 1 Mod PT Treatments $Therapeutic Activity: 8-22 mins PT General Charges $$ ACUTE PT VISIT: 1 Visit         1:21 PM, 09/27/23 Ocie Bob, MPT Physical Therapist with [facility] 336 [phone] office [phone] mobile phone

## 2023-09-27 NOTE — Progress Notes (Signed)
PHARMACY - ANTICOAGULATION CONSULT NOTE  Pharmacy Consult for heparin Indication: pulmonary embolus  Labs: Recent Labs    09/25/23 1330 09/25/23 1554 09/25/23 2113 09/25/23 2113 09/26/23 0344 09/26/23 1122 09/26/23 1808 09/27/23 0013  HGB 10.4*  --   --   --  10.5*  --   --   --   HCT 32.6*  --   --   --  33.0*  --   --   --   PLT 116*  --   --   --  122*  --   --   --   APTT  --   --  27  --   --   --   --   --   LABPROT  --   --  14.3  --   --   --   --   --   INR  --   --  1.1  --   --   --   --   --   HEPARINUNFRC  --   --  <0.10*   < > <0.10* <0.10* 0.63 0.73*  CREATININE 0.39*  --  0.46  --  0.48  --   --   --   TROPONINIHS 3 2  --   --   --   --   --   --    < > = values in this interval not displayed.   Assessment: 37yo female now slightly supratherapeutic on heparin after one level at upper end of goal; no infusion issues or signs of bleeding per RN.  Goal of Therapy:  Heparin level 0.3-0.7 units/ml   Plan:  Decrease heparin infusion by ~1 unit/kg/hr to 2200 units/hr. Check level in 6 hours.   Vernard Gambles, PharmD, BCPS 09/27/2023 12:46 AM

## 2023-09-27 NOTE — TOC Progression Note (Signed)
Transition of Care [facility]) - Progression Note    Patient Details  Name: Jaime [name] MRN: [id number] Date of Birth: [birth year]  [facility] CM/SW Contact  Villa Herb, [place] Phone Number: 09/27/2023, 1:23 PM  Clinical Narrative:    PT recommending HH PT for pt at D/C. CSW met with pt at bedside to explain that there are no HH agencies in network with her insurance as they do not cover HH. Pt is understand. CSW explained outpatient PT referral, pt is agreeable to this. CSW to make referral for OP PT services. TOC to follow.   Expected Discharge Plan: Skilled Nursing Facility Barriers to Discharge: Continued Medical Work up  Expected Discharge Plan and Services In-house Referral: Clinical Social Work Discharge Planning Services: CM Consult   Living arrangements for the past 2 months: Single Family Home (Has 5 stairs.)                                       Social Determinants of Health (SDOH) Interventions SDOH Screenings   Food Insecurity: No Food Insecurity (09/25/2023)  Housing: Low Risk  (09/25/2023)  Transportation Needs: No Transportation Needs (09/25/2023)  Utilities: Not At Risk (09/25/2023)  Alcohol Screen: Low Risk  (07/02/2023)  Depression (PHQ2-9): High Risk (09/14/2023)  Financial Resource Strain: Medium Risk (05/09/2020)  Physical Activity: Insufficiently Active (05/09/2020)  Social Connections: Unknown (03/04/2022)   Received from [facility]  Stress: Stress Concern Present (05/09/2020)  Tobacco Use: High Risk (09/25/2023)    Readmission Risk Interventions    09/26/2023    1:41 PM  Readmission Risk Prevention Plan  Transportation Screening Complete  Medication Review (RN Care Manager) Complete  PCP or Specialist appointment within 3-5 days of discharge Complete  HRI or Home Care Consult Complete  SW Recovery Care/Counseling Consult Complete  Palliative Care Screening Not Applicable  Skilled Nursing Facility Not  Applicable

## 2023-09-27 NOTE — Progress Notes (Signed)
Patient had large bowel movement, noted blood in stool, red in color. MD Gwenlyn Perking made aware. New order monitor stools.

## 2023-09-27 NOTE — Progress Notes (Signed)
PROGRESS NOTE  Jaime [name], is a 37 y.o. female, DOB - 1986-04-09, ZOX:[id number]  Admit date - 09/25/2023   Admitting Physician Lilyan Gilford, DO  Outpatient Primary MD for the patient is [facility], [facility]  LOS - 2  Chief Complaint  Patient presents with   Leg Pain      Brief Narrative:  37 y.o. 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 readmitted on 09/26/2023 with acute PE in the setting of noncompliance/missed doses of Eliquis   -Assessment and Plan: 1)Acute pulmonary embolism /left lower extremity DVT - Patient with recurrent DVTs and PEs -Previously had IVC filter placed on 08/28/2023 subsequently IVC filter clotted -Had mechanical thrombectomy on 09/16/2023 in the setting of DVT PE  -CTA chest from 09/25/2023 with acute PE -Venous Doppler of the left lower extremity from 09/26/2023 with occlusive left lower extremity from the left femoral vein to the popliteal vein -Echo from 09/26/2023 with EF of 55 to 60%,, no right heart strain - Patient was on Eliquis at home but reports missed doses - Pharmacy confirms missed doses with lab anti-- Xa - Patient has completed 48 hours of IV heparin drip; will transition to oral anticoagulation therapy using Eliquis. -Given prior missing dosages will opt to treat as a new transition and start loading dose with 7 days of 10 mg twice a day. -Medication compliance discussed with patient. -Follow hemoglobin trend/stability and any signs of overt bleeding prior to discharge.  2) acute hypoxic respiratory failure--- due to #1 above -Currently requiring intermittently oxygen supplementation -Especially at rest demonstrating saturation to be stable on room air. -Will check the saturation screening -Continue as needed supplementation.  3)DM2-A1c is 9.4 reflecting  uncontrolled DM with hyperglycemia PTA -Continue Levemir 45 units -Continue sliding scale insulin -Follow CBG fluctuation and adjust hypoglycemic regimen as needed.  4)Gastroesophageal reflux disease -Continue Pepcid -Lifestyle changes discussion and weight loss discussed with patient.  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  6)Class 2 Obesity- -Low calorie diet, portion control and increase physical activity discussed with patient -Body mass index is 37.31 kg/m. -Patient advised to maintain adequate hydration.  7) depression/anxiety/insomnia --- stable,  -continue Abilify and Seroquel scheduled -Continue as needed hydroxyzine and clonazepam.  8)Tobacco use disorder -Cessation counseling provided -Continue nicotine patch.  Total care time 50 minutes  Status is: Inpatient   Disposition: The patient is from: Home              Anticipated d/c is to: Home              Anticipated d/c date is: On 09/28/2023              Patient currently is not medically stable to d/c. Barriers: Not Clinically Stable-   Code Status :  -  Code Status: Full Code   Family Communication:    NA (patient is alert, awake and coherent)   DVT Prophylaxis  :   - iv Heparin   apixaban (ELIQUIS) tablet 10 mg  apixaban (ELIQUIS) tablet 5 mg   Lab Results  Component Value Date   PLT 121 (L) 09/27/2023    Inpatient Medications  Scheduled Meds:  apixaban  10 mg Oral BID   Followed by   Melene Muller ON 10/04/2023] apixaban  5 mg Oral BID   ARIPiprazole  20 mg Oral Daily   famotidine  20 mg Oral Daily   insulin aspart  0-15 Units Subcutaneous TID WC   insulin aspart  0-5 Units Subcutaneous QHS   insulin detemir  45 Units Subcutaneous QHS   lisinopril  5 mg Oral Daily   nicotine  21 mg Transdermal Daily   pregabalin  150 mg Oral BID   QUEtiapine  25 mg Oral QHS   Continuous  Infusions:   PRN Meds:.acetaminophen **OR** acetaminophen, clonazePAM, HYDROmorphone (DILAUDID) injection, hydrOXYzine, ondansetron **OR** ondansetron (ZOFRAN) IV, oxyCODONE   Anti-infectives (From admission, onward)    None         Subjective: Jaime [name] no chest pain, no nausea or vomiting.  Per nursing staff large bowel movement with bloody streak; intermittently requiring oxygen supplementation.  Overall feeling better.  Complaining of insomnia.  Objective: Vitals:   09/26/23 2032 09/27/23 0459 09/27/23 0917 09/27/23 1305  BP: 102/60 105/62 112/68 (!) 115/58  Pulse: (!) 104 93  (!) 110  Resp: 20 20  18   Temp: 98.5 F (36.9 C) 98.4 F (36.9 C)  98.3 F (36.8 C)  TempSrc: Oral     SpO2: 94% 96%  100%  Weight:      Height:        Intake/Output Summary (Last 24 hours) at 09/27/2023 1417 Last data filed at 09/27/2023 1120 Gross per 24 hour  Intake 520.06 ml  Output 1 ml  Net 519.06 ml   Filed Weights   09/25/23 1239 09/25/23 2323  Weight: 113.4 kg 114.6 kg    Physical Exam General exam: Alert, awake, oriented x 3; obese; expressing no chest pain and complaining of insomnia.  At time of examination no requiring oxygen supplementation, but per nursing report has been experiencing on and off need of oxygen. Respiratory system: No wheezing, no crackles.  No using accessory muscles Cardiovascular system:RRR. No murmurs, rubs orgallops. Gastrointestinal system: Abdomen is obese, nondistended, soft and nontender. No organomegaly or masses felt. Normal bowel sounds heard. Central nervous system: No focal neurological deficits. Extremities: No cyanosis or clubbing. Skin: No petechiae. Psychiatry: Judgement and insight appear normal. Mood & affect appropriate.   Data Reviewed: I have personally reviewed following labs and imaging studies  CBC: Recent Labs  Lab 09/25/23 1330 09/26/23 0344 09/27/23 0732  WBC 5.3 5.7 6.4  NEUTROABS 3.8 4.8  --   HGB 10.4* 10.5*  10.5*  HCT 32.6* 33.0* 35.2*  MCV 91.1 90.9 94.9  PLT 116* 122* 121*   Basic Metabolic Panel: Recent Labs  Lab 09/25/23 1330 09/25/23 2113 09/26/23 0344 09/27/23 0732  NA 136 134* 135 136  K 3.5 4.3 4.3 4.0  CL 106 104 103 104  CO2 23 24 24 26   GLUCOSE 240* 306* 345* 173*  BUN 6 7 6 6   CREATININE 0.39* 0.46 0.48 0.51  CALCIUM 7.9* 8.0* 8.0* 7.9*  MG  --   --  1.7  --    GFR: Estimated Creatinine Clearance: 127.6 mL/min (by C-G formula based on SCr of 0.51 mg/dL). Liver Function Tests: Recent Labs  Lab 09/25/23 1330 09/26/23 0344  AST 19 20  ALT 19 18  ALKPHOS 152* 150*  BILITOT 1.0 0.9  PROT 5.9* 5.6*  ALBUMIN 2.5* 2.3*   Recent Results (from the past 240 hour(s))  Resp panel by RT-PCR (RSV, Flu A&B, Covid) Anterior Nasal Swab     Status: None   Collection Time: 09/25/23  2:16 PM   Specimen: Anterior Nasal Swab  Result Value  Ref Range Status   SARS Coronavirus 2 by RT PCR NEGATIVE NEGATIVE Final    Comment: (NOTE) SARS-CoV-2 target nucleic acids are NOT DETECTED.  The SARS-CoV-2 RNA is generally detectable in upper respiratory specimens during the acute phase of infection. The lowest concentration of SARS-CoV-2 viral copies this assay can detect is 138 copies/mL. A negative result does not preclude SARS-Cov-2 infection and should not be used as the sole basis for treatment or other patient management decisions. A negative result may occur with  improper specimen collection/handling, submission of specimen other than nasopharyngeal swab, presence of viral mutation(s) within the areas targeted by this assay, and inadequate number of viral copies(<138 copies/mL). A negative result must be combined with clinical observations, patient history, and epidemiological information. The expected result is Negative.  Fact Sheet for Patients:  BloggerCourse.com  Fact Sheet for Healthcare Providers:   SeriousBroker.it  This test is no t yet approved or cleared by the [place] FDA and  has been authorized for detection and/or diagnosis of SARS-CoV-2 by FDA under an Emergency Use Authorization (EUA). This EUA will remain  in effect (meaning this test can be used) for the duration of the COVID-19 declaration under Section 564(b)(1) of the Act, 21 U.S.C.section 360bbb-3(b)(1), unless the authorization is terminated  or revoked sooner.       Influenza A by PCR NEGATIVE NEGATIVE Final   Influenza B by PCR NEGATIVE NEGATIVE Final    Comment: (NOTE) The Xpert Xpress SARS-CoV-2/FLU/RSV plus assay is intended as an aid in the diagnosis of influenza from Nasopharyngeal swab specimens and should not be used as a sole basis for treatment. Nasal washings and aspirates are unacceptable for Xpert Xpress SARS-CoV-2/FLU/RSV testing.  Fact Sheet for Patients: BloggerCourse.com  Fact Sheet for Healthcare Providers: SeriousBroker.it  This test is not yet approved or cleared by the [place] FDA and has been authorized for detection and/or diagnosis of SARS-CoV-2 by FDA under an Emergency Use Authorization (EUA). This EUA will remain in effect (meaning this test can be used) for the duration of the COVID-19 declaration under Section 564(b)(1) of the Act, 21 U.S.C. section 360bbb-3(b)(1), unless the authorization is terminated or revoked.     Resp Syncytial Virus by PCR NEGATIVE NEGATIVE Final    Comment: (NOTE) Fact Sheet for Patients: BloggerCourse.com  Fact Sheet for Healthcare Providers: SeriousBroker.it  This test is not yet approved or cleared by the [place] FDA and has been authorized for detection and/or diagnosis of SARS-CoV-2 by FDA under an Emergency Use Authorization (EUA). This EUA will remain in effect (meaning this test can be used) for  the duration of the COVID-19 declaration under Section 564(b)(1) of the Act, 21 U.S.C. section 360bbb-3(b)(1), unless the authorization is terminated or revoked.  Performed at [facility], [address], [place], [place] [address]     Radiology Studies: ECHOCARDIOGRAM COMPLETE  Result Date: 09/26/2023    ECHOCARDIOGRAM REPORT   Patient Name:   VIRIGINIA CHAMBLESS Date of Exam: 09/26/2023 Medical Rec #:  [id number]        Height:       69.0 in Accession #:    [id number]       Weight:       252.6 lb Date of Birth:  January 23, 1986         BSA:          2.282 m Patient Age:    37 years         BP:  106/70 mmHg Patient Gender: F                HR:           99 bpm. [facility] Procedure: 2D Echo, Cardiac Doppler and Color Doppler Indications:    Pulmonary Embolus I26.09  History:        Patient has prior history of Echocardiogram examinations, most                 recent 07/31/2020. COPD; Risk Factors:Diabetes, Dyslipidemia and                 Current Smoker.  Sonographer:    Celesta Gentile RCS Referring Phys: [id number] ASIA B ZIERLE-GHOSH IMPRESSIONS  1. Left ventricular ejection fraction, by estimation, is 55 to 60%. The left ventricle has normal function. The left ventricle has no regional wall motion abnormalities. Left ventricular diastolic parameters were normal.  2. Right ventricular systolic function is normal. The right ventricular size is normal.  3. The mitral valve is normal in structure. No evidence of mitral valve regurgitation. No evidence of mitral stenosis.  4. The aortic valve is normal in structure. Aortic valve regurgitation is not visualized. No aortic stenosis is present.  5. The inferior vena cava is normal in size with greater than 50% respiratory variability, suggesting right atrial pressure of 3 mmHg. FINDINGS  Left Ventricle: Left ventricular ejection fraction, by estimation, is 55 to 60%. The left ventricle has normal function. The left ventricle has no regional wall  motion abnormalities. The left ventricular internal cavity size was normal in size. There is  no left ventricular hypertrophy. Left ventricular diastolic parameters were normal. Right Ventricle: The right ventricular size is normal. No increase in right ventricular wall thickness. Right ventricular systolic function is normal. Left Atrium: Left atrial size was normal in size. Right Atrium: Right atrial size was normal in size. Pericardium: There is no evidence of pericardial effusion. Mitral Valve: The mitral valve is normal in structure. No evidence of mitral valve regurgitation. No evidence of mitral valve stenosis. Tricuspid Valve: The tricuspid valve is normal in structure. Tricuspid valve regurgitation is not demonstrated. No evidence of tricuspid stenosis. Aortic Valve: The aortic valve is normal in structure. Aortic valve regurgitation is not visualized. No aortic stenosis is present. Aortic valve mean gradient measures 11.0 mmHg. Aortic valve peak gradient measures 17.5 mmHg. Aortic valve area, by VTI measures 2.51 cm. Pulmonic Valve: The pulmonic valve was normal in structure. Pulmonic valve regurgitation is not visualized. No evidence of pulmonic stenosis. Aorta: The aortic root is normal in size and structure. Venous: The inferior vena cava is normal in size with greater than 50% respiratory variability, suggesting right atrial pressure of 3 mmHg. IAS/Shunts: No atrial level shunt detected by color flow Doppler.  LEFT VENTRICLE PLAX 2D LVIDd:         4.10 cm   Diastology LVIDs:         2.40 cm   LV e' medial:    9.25 cm/s LV PW:         1.10 cm   LV E/e' medial:  12.2 LV IVS:        1.00 cm   LV e' lateral:   11.40 cm/s LVOT diam:     2.00 cm   LV E/e' lateral: 9.9 LV SV:         111 LV SV Index:   49 LVOT Area:     3.14 cm  RIGHT VENTRICLE RV S prime:     15.60 cm/s TAPSE (M-mode): 2.6 cm LEFT ATRIUM             Index        RIGHT ATRIUM           Index LA diam:        3.80 cm 1.67 cm/m   RA Area:      15.20 cm LA Vol (A2C):   53.2 ml 23.32 ml/m  RA Volume:   36.30 ml  15.91 ml/m LA Vol (A4C):   48.9 ml 21.43 ml/m LA Biplane Vol: 51.0 ml 22.35 ml/m  AORTIC VALVE AV Area (Vmax):    2.62 cm AV Area (Vmean):   2.46 cm AV Area (VTI):     2.51 cm AV Vmax:           209.00 cm/s AV Vmean:          151.000 cm/s AV VTI:            0.443 m AV Peak Grad:      17.5 mmHg AV Mean Grad:      11.0 mmHg LVOT Vmax:         174.00 cm/s LVOT Vmean:        118.000 cm/s LVOT VTI:          0.354 m LVOT/AV VTI ratio: 0.80  AORTA Ao Root diam: 3.60 cm MITRAL VALVE MV Area (PHT): 2.95 cm     SHUNTS MV Decel Time: 257 msec     Systemic VTI:  0.35 m MV E velocity: 113.00 cm/s  Systemic Diam: 2.00 cm MV A velocity: 92.20 cm/s MV E/A ratio:  1.23 Donato Schultz MD Electronically signed by Donato Schultz MD Signature Date/Time: 09/26/2023/1:47:34 PM    Final    US Venous Img Lower Unilateral Left (DVT)  Result Date: 09/26/2023 CLINICAL DATA:  Left leg swelling.  History of DVT. EXAM: LEFT LOWER EXTREMITY VENOUS DOPPLER ULTRASOUND TECHNIQUE: Gray-scale sonography with compression, as well as color and duplex ultrasound, were performed to evaluate the deep venous system(s) from the level of the common femoral vein through the popliteal and proximal calf veins. COMPARISON:  CT venogram yesterday. Left lower extremity DVT ultrasound dated September 13, 2023. FINDINGS: VENOUS Occlusive thrombus within the left common femoral vein. Nonocclusive thrombus within the profunda femoral vein, femoral vein, and popliteal vein. Normal compressibility of the saphenofemoral junction and posterior tibial vein. Peroneal vein is not visualized. Limited views of the contralateral common femoral vein are unremarkable. OTHER None. Limitations: None. IMPRESSION: 1. Continued left lower extremity DVT from the left common femoral vein to the popliteal vein. DVT is occlusive within the common femoral vein. Electronically Signed   By: Obie Dredge M.D.   On:  09/26/2023 12:34   CT VENOGRAM ABD/PEL  Result Date: 09/25/2023 CLINICAL DATA:  Caval thrombus and left lower extremity DVT. Follow-up examination EXAM: CT VENOGRAM ABDOMEN AND PELVIS TECHNIQUE: Venographic phase images of the abdomen and pelvis were obtained following the administration of intravenous contrast. Multiplanar reformats and maximum intensity projections were generated. RADIATION DOSE REDUCTION: This exam was performed according to the departmental dose-optimization program which includes automated exposure control, adjustment of the mA and/or kV according to patient size and/or use of iterative reconstruction technique. CONTRAST:  OMNIPAQUE IOHEXOL 350 MG/ML SOLN COMPARISON:  09/15/2023 FINDINGS: Small left pleural effusion. Stable left basilar collapse and consolidation. Cardiac size within normal limits. Small hiatal hernia. Gastroesophageal varices noted. Infiltrative changes  surrounding the uncinate process of the pancreas extending into the mesenteric root appears slightly progressive since prior examination and may represent retroperitoneal edema or a superimposed inflammatory process suggest acute interstitial/edematous pancreatitis. Fatty atrophy of the pancreas again noted. Otherwise normal enhancement of the pancreatic parenchyma. The pancreatic duct is not dilated. No loculated peripancreatic fluid collections are identified. 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 Approximately 2/3 of the splenic parenchyma appears devascularized. Stable splenomegaly. The adrenal glands are unremarkable. The kidneys are normal. Bladder is unremarkable. Progressive infiltrative changes surround the third and fourth portion the duodenum which may relate to retroperitoneal edema or inflammatory process following the uncinate process of the pancreas. Moderate ascites again noted, stable since prior examination. The stomach,  small bowel, and large bowel are otherwise unremarkable. The appendix is not clearly identified, however, no secondary signs of appendicitis are seen within the right lower quadrant. Inferior vena cava filter is in place within the infrarenal cava, unchanged from prior examination. Previously noted caval thrombus has resolved. 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 Previously noted superimposed acute intraluminal thrombus, however, has resolved and the left common femoral, external iliac, and common iliac veins appear patent with inline flow. Small residual filling defect identified within the visualized left femoral vein in keeping with DVT in this location. Pelvic organs are unremarkable. Diffuse subcutaneous body wall edema has progressed slightly in the interval since prior examination. Asymmetric circumferential subcutaneous edema within the left lower extremity is again identified likely related to impaired venous return given left femoral DVT and left external iliac venous stenosis. No acute bone abnormality.  No lytic or blastic bone lesion. IMPRESSION: 1. Interval resolution of previously noted caval thrombus. 2. Interval resolution of previously noted acute intraluminal thrombus within the left common femoral, external iliac, and common iliac veins. Inline flow reestablished though residual high-grade stenosis of the left external iliac vein, related to remote thrombosis, again noted. The 3. Small residual filling defect within the visualized left femoral vein in keeping with DVT in this location. 4. Infiltrative changes surrounding the uncinate process of the pancreas extending into the mesenteric root appears slightly progressive since prior examination and may represent retroperitoneal edema or a superimposed inflammatory process suggest acute  interstitial/edematous pancreatitis. Correlation with serum lipase would be helpful in differentiating these entities. 5. Stable small left pleural effusion. Stable left basilar collapse and consolidation. 6. Stable moderate ascites. 7. Stable splenomegaly. 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 8. Diffuse subcutaneous body wall edema has progressed slightly in the interval since prior examination. Asymmetric circumferential subcutaneous edema within the left lower extremity is again identified likely related to impaired venous return given left femoral DVT and left external iliac venous stenosis. Electronically Signed   By: Helyn Numbers M.D.   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 Now presenting with left leg swelling, warmth, pain, and shortness of breath. PE suspected. EXAM: CT ANGIOGRAPHY CHEST WITH CONTRAST TECHNIQUE: Multidetector CT imaging of the chest was performed using the standard protocol during bolus administration of intravenous contrast. Multiplanar CT image reconstructions and MIPs were obtained to evaluate the vascular anatomy. RADIATION DOSE REDUCTION: This  exam was performed according to the departmental dose-optimization program which includes automated exposure control, adjustment of the mA and/or kV according to patient size and/or use of iterative reconstruction technique. CONTRAST:  OMNIPAQUE IOHEXOL 350 MG/ML SOLN COMPARISON:  None Available. FINDINGS: Cardiovascular: Acute segmental and subsegmental pulmonary emboli in the right upper lobe (series 4/image 105). There is evidence of right heart strain with RV: LV ratio of 1.2. There is mild flattening of the interventricular septum. No pericardial effusion. Mediastinum/Nodes: Mild wall thickening  of the distal esophagus and paraesophageal varices. Trachea is unremarkable. No thoracic adenopathy. Lungs/Pleura: Consolidation in the left lower lobe may be due to atelectasis or pneumonia and is similar to prior. Small left pleural effusion. No pneumothorax. Upper Abdomen: Nodular contour of the liver compatible with cirrhosis. Cholecystectomy. Upper abdominal ascites. Heterogenous enhancement of the spleen which is enlarged. Musculoskeletal: No acute fracture. Review of the MIP images confirms the above findings. IMPRESSION: 1. Acute segmental and subsegmental pulmonary emboli in the right upper lobe. Positive for acute PE with CT evidence of right heart strain (RV/LV Ratio = 1.2) consistent with at least submassive (intermediate risk) PE. The presence of right heart strain has been associated with an increased risk of morbidity and mortality. Please refer to the "Code PE Focused" order set in EPIC. 2. Consolidation in the left lower lobe may be due to atelectasis or pneumonia. Small left pleural effusion. 3. Cirrhosis with upper abdominal ascites. See separate report for findings in the abdomen. 4. Mild wall thickening of the distal esophagus may be due to venous congestion or esophagitis. Paraesophageal varices. Critical Value/emergent results were called by telephone at the time of interpretation on 09/25/2023 at 8:36 pm to provider PA [facility], who verbally acknowledged these results. Electronically Signed   By: Minerva Fester M.D.   On: 09/25/2023 20:39    Scheduled Meds:  apixaban  10 mg Oral BID   Followed by   Melene Muller ON 10/04/2023] apixaban  5 mg Oral BID   ARIPiprazole  20 mg Oral Daily   famotidine  20 mg Oral Daily   insulin aspart  0-15 Units Subcutaneous TID WC   insulin aspart  0-5 Units Subcutaneous QHS   insulin detemir  45 Units Subcutaneous QHS   lisinopril  5 mg Oral Daily   nicotine  21 mg Transdermal Daily   pregabalin  150 mg Oral BID   QUEtiapine  25 mg Oral QHS    Continuous Infusions:   LOS: 2 days   Vassie Loll M.D on 09/27/2023 at 2:17 PM  Go to [email or web address] - for contact info  [facility] - Office  [phone]  If 7PM-7AM, please contact night-coverage [email or web address] 09/27/2023, 2:17 PM

## 2023-09-27 NOTE — Progress Notes (Signed)
Patient alert and verbal, tolerated medications whole with no complaints. Patient ambulated in hallway with staff. Reported complaints of pain to legs and back PRN given, see MAR. Reported reported blood in stool earlier during shift, no other reports of blood in stool.

## 2023-09-27 NOTE — Discharge Instructions (Signed)
Information on my medicine - ELIQUIS (apixaban)  Why was Eliquis prescribed for you? Eliquis was prescribed to treat blood clots that may have been found in the veins of your legs (deep vein thrombosis) or in your lungs (pulmonary embolism) and to reduce the risk of them occurring again.  What do You need to know about Eliquis ? The starting dose is 10 mg (two 5 mg tablets) taken TWICE daily for the FIRST SEVEN (7) DAYS, then on (enter date)  10/04/23  the dose is reduced to ONE 5 mg tablet taken TWICE daily.  Eliquis may be taken with or without food.   Try to take the dose about the same time in the morning and in the evening. If you have difficulty swallowing the tablet whole please discuss with your pharmacist how to take the medication safely.  Take Eliquis exactly as prescribed and DO NOT stop taking Eliquis without talking to the doctor who prescribed the medication.  Stopping may increase your risk of developing a new blood clot.  Refill your prescription before you run out.  After discharge, you should have regular check-up appointments with your healthcare provider that is prescribing your Eliquis.    What do you do if you miss a dose? If a dose of ELIQUIS is not taken at the scheduled time, take it as soon as possible on the same day and twice-daily administration should be resumed. The dose should not be doubled to make up for a missed dose.  Important Safety Information A possible side effect of Eliquis is bleeding. You should call your healthcare provider right away if you experience any of the following: Bleeding from an injury or your nose that does not stop. Unusual colored urine (red or dark brown) or unusual colored stools (red or black). Unusual bruising for unknown reasons. A serious fall or if you hit your head (even if there is no bleeding).  Some medicines may interact with Eliquis and might increase your risk of bleeding or clotting while on Eliquis. To  help avoid this, consult your healthcare provider or pharmacist prior to using any new prescription or non-prescription medications, including herbals, vitamins, non-steroidal anti-inflammatory drugs (NSAIDs) and supplements.  This website has more information on Eliquis (apixaban): http://www.eliquis.com/eliquis/home

## 2023-09-28 ENCOUNTER — Telehealth: Payer: Self-pay | Admitting: Gastroenterology

## 2023-09-28 DIAGNOSIS — I2699 Other pulmonary embolism without acute cor pulmonale: Secondary | ICD-10-CM | POA: Diagnosis not present

## 2023-09-28 DIAGNOSIS — F17200 Nicotine dependence, unspecified, uncomplicated: Secondary | ICD-10-CM | POA: Diagnosis not present

## 2023-09-28 DIAGNOSIS — J9601 Acute respiratory failure with hypoxia: Secondary | ICD-10-CM | POA: Diagnosis not present

## 2023-09-28 DIAGNOSIS — I824Y2 Acute embolism and thrombosis of unspecified deep veins of left proximal lower extremity: Secondary | ICD-10-CM | POA: Diagnosis not present

## 2023-09-28 LAB — BASIC METABOLIC PANEL
Anion gap: 9 (ref 5–15)
BUN: 7 mg/dL (ref 6–20)
CO2: 24 mmol/L (ref 22–32)
Calcium: 8.1 mg/dL — ABNORMAL LOW (ref 8.9–10.3)
Chloride: 104 mmol/L (ref 98–111)
Creatinine, Ser: 0.59 mg/dL (ref 0.44–1.00)
GFR, Estimated: >60 mL/min (ref >60)
Glucose, Bld: 249 mg/dL — ABNORMAL HIGH (ref 70–99)
Potassium: 4 mmol/L (ref 3.5–5.1)
Sodium: 137 mmol/L (ref 135–145)

## 2023-09-28 LAB — CBC
HCT: 33.1 % — ABNORMAL LOW (ref 36.0–46.0)
Hemoglobin: 10.4 g/dL — ABNORMAL LOW (ref 12.0–15.0)
MCH: 29.2 pg (ref 26.0–34.0)
MCHC: 31.4 g/dL (ref 30.0–36.0)
MCV: 93 fL (ref 80.0–100.0)
Platelets: 121 10*3/uL — ABNORMAL LOW (ref 150–400)
RBC: 3.56 MIL/uL — ABNORMAL LOW (ref 3.87–5.11)
RDW: 15.6 % — ABNORMAL HIGH (ref 11.5–15.5)
WBC: 5.2 10*3/uL (ref 4.0–10.5)
nRBC: 0 % (ref 0.0–0.2)

## 2023-09-28 LAB — GLUCOSE, CAPILLARY
Glucose-Capillary: 216 mg/dL — ABNORMAL HIGH (ref 70–99)
Glucose-Capillary: 229 mg/dL — ABNORMAL HIGH (ref 70–99)

## 2023-09-28 MED ORDER — PROPRANOLOL HCL 10 MG PO TABS: 10.0000 mg | ORAL_TABLET | Freq: Two times a day (BID) | ORAL | Status: AC

## 2023-09-28 MED ORDER — APIXABAN 5 MG PO TABS: ORAL_TABLET | ORAL | 4 refills | Status: AC

## 2023-09-28 MED ORDER — FAMOTIDINE 20 MG PO TABS: 20.0000 mg | ORAL_TABLET | Freq: Every day | ORAL | Status: AC

## 2023-09-28 MED ORDER — TORSEMIDE 20 MG PO TABS: 40.0000 mg | ORAL_TABLET | Freq: Every day | ORAL | Status: DC

## 2023-09-28 MED ORDER — NICOTINE 21 MG/24HR TD PT24: 21.0000 mg | MEDICATED_PATCH | Freq: Every day | TRANSDERMAL | 0 refills | Status: DC

## 2023-09-28 MED ORDER — OXYCODONE HCL 10 MG PO TABS: 10.0000 mg | ORAL_TABLET | Freq: Four times a day (QID) | ORAL | 0 refills | Status: DC | PRN

## 2023-09-28 NOTE — Progress Notes (Signed)
Nutrition Brief Note  Patient identified on the Malnutrition Screening Tool (MST) Report. Patient with no weight loss per review of weight encounters below. She confirms no weight loss noted at home. She has a good appetite and has been eating well.   Wt Readings from Last 15 Encounters:  09/25/23 114.6 kg  09/17/23 113.7 kg  09/14/23 112.5 kg  09/14/23 112.5 kg  09/13/23 112.2 kg  07/26/23 106 kg  05/31/23 109.3 kg  05/25/23 109.3 kg  05/20/23 108.8 kg  04/28/23 103.9 kg  04/27/23 104.3 kg  03/24/23 115.4 kg  03/11/23 106 kg  02/11/23 108.2 kg  02/10/23 107 kg    Body mass index is 37.31 kg/m. Patient meets criteria for obesity based on current BMI.   Current diet order is heart healthy, patient is consuming approximately 100% of meals at this time. Labs and medications reviewed.   No nutrition interventions warranted at this time. If nutrition issues arise, please consult RD.   Gabriel Rainwater RD, LDN, CNSC Please refer to Amion for contact information.

## 2023-09-28 NOTE — Plan of Care (Signed)

## 2023-09-28 NOTE — Plan of Care (Signed)
  Problem: Education: Goal: Knowledge of General Education information will improve Description: Including pain rating scale, medication(s)/side effects and non-pharmacologic comfort measures Outcome: Adequate for Discharge   Problem: Health Behavior/Discharge Planning: Goal: Ability to manage health-related needs will improve Outcome: Progressing   Problem: Clinical Measurements: Goal: Ability to maintain clinical measurements within normal limits will improve Outcome: Adequate for Discharge Goal: Will remain free from infection Outcome: Progressing Goal: Diagnostic test results will improve Outcome: Progressing   Problem: Activity: Goal: Risk for activity intolerance will decrease Outcome: Progressing   Problem: Nutrition: Goal: Adequate nutrition will be maintained Outcome: Progressing

## 2023-09-28 NOTE — Telephone Encounter (Signed)
You can schedule with this office if [facility] do not have availability.

## 2023-09-28 NOTE — Discharge Summary (Signed)
Physician Discharge Summary   Patient: ALLISEN HATLESTAD MRN: [id number] DOB: Nov 15, 1985  Admit date:     09/25/2023  Discharge date: 09/28/23  Discharge Physician: Vassie Loll   PCP: Medicine, [facility]   Recommendations at discharge:  Repeat CBC to follow hemoglobin trend/stability Repeat basic metabolic panel to follow renal function trend and electrolytes stability. Continue assisting patient with weight management Continue assisting patient with tobacco cessation Continue close monitoring of patient's CBGs/A1c and further adjust hypoglycemic regimen as required.   Discharge Diagnoses: Principal Problem:   Acute pulmonary embolism (HCC) Active Problems:   Acute respiratory failure with hypoxia (HCC)   Cirrhosis (HCC)   Gastroesophageal reflux disease   Hyperglycemia due to type 2 diabetes mellitus (HCC)   Tobacco use disorder   Acute deep vein thrombosis (DVT) of left lower extremity [facility])  Brief Hospital admission course: As per H&P written by Dr.Zierle-Ghosh on 09/26/23 VIDA COLLMAN is a 37 y.o. female with medical history significant of depression, anxiety, cirrhosis, COPD, diabetes mellitus with hyperglycemia, DVT, GERD, hep C, hyperlipidemia, tobacco use disorder, and more presents the ED with a chief complaint of left leg pain.  Patient reports that she was diagnosed with a blood clot in April in that leg.  It looks like she was also diagnosed earlier this month.  She reports in April she was on Lovenox for 3 months and then switched to Eliquis.  She has not been on warfarin or Coumadin.  Patient reports she has had missed doses of Eliquis.  She thinks this happens once or twice per week due to her forgetting the medication.  She has associated shortness of breath.  That also started 1 week ago.  She has no fevers.  Patient reports she does have chest pain in the center of her chest.  It is a stabbing type of pain.  It is worse with exertion.  Worse with  inspiration.  Patient reports history of blood clots.  She does not have a clotting disorder that she knows of.  She reports she had Nexplanon placed earlier this year and is planning for removal soon.  She thinks Nexplanon is related to her blood clots.  Patient has had nausea, vomiting, diarrhea.  It happened 4 times per day.  There is been no hematemesis.  She reports her last meal was the day prior to presentation.  She threw it back up and it was undigested food.  Patient reports her pain also extends to where her spleen is.  She reports she had a filter placed there and she is sore.  Patient has no other complaints at this time.  She is full code.   Assessment and Plan: 1)Acute pulmonary embolism /left lower extremity DVT - Patient with recurrent DVTs and PEs -Previously had IVC filter placed on 08/28/2023 subsequently IVC filter clotted -Had mechanical thrombectomy on 09/16/2023 in the setting of DVT PE  -CTA chest from 09/25/2023 with acute PE -Venous Doppler of the left lower extremity from 09/26/2023 with occlusive left lower extremity from the left femoral vein to the popliteal vein -Echo from 09/26/2023 with EF of 55 to 60%,, no right heart strain - Patient was on Eliquis at home but reports missed doses - Pharmacy confirms missed doses with lab anti-- Xa - Patient has completed 48 hours of IV heparin drip; will transition to oral anticoagulation therapy using Eliquis. -Given prior missing dosages will opt to treat as a new transition and start loading dose with 7 days of 10  mg twice a day. -Medication compliance discussed with patient. -Follow hemoglobin trend/stability and any signs of overt bleeding prior to discharge.   2) acute hypoxic respiratory failure--- due to #1 above -Patient was initially requiring oxygen supplementation with exertion. -Desaturation screening demonstrated no need for oxygen supplementation at time of discharge.   3)DM2-A1c is 9.4 reflecting uncontrolled  DM with hyperglycemia PTA -Continue to follow CBGs/A1c with further adjustment to hypoglycemic regimen as required. -Home hypoglycemic regimen resumed at discharge.   4)Gastroesophageal reflux disease -Continue PPI twice a day and the use of Pepcid -Lifestyle changes discussion and weight loss discussed with patient. -Patient to follow-up with GI service as an outpatient.   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   6)Class 2 Obesity- -Low calorie diet, portion control and increase physical activity discussed with patient -Body mass index is 37.31 kg/m. -Patient advised to maintain adequate hydration.   7) depression/anxiety/insomnia --- stable,  -continue Abilify and Seroquel scheduled -Continue as needed hydroxyzine and clonazepam. -No suicidal ideation hallucination.   8)Tobacco use disorder -Cessation counseling provided -Continue nicotine patch.  9) chronic pain syndrome -Continue outpatient follow-up with pain clinic -Continue as needed analgesics as previously prescribed. -Patient instructed to avoid the use of NSAIDs in the setting of chronic anticoagulation therapy and high risk for bleeding.  Consultants: GI service curbside (Dr. Levon Hedger). Procedures performed: See below for x-ray reports. Disposition: Home Diet recommendation: Modified, cardiac diet, low calorie and heart healthy diet discussed with patient.  DISCHARGE MEDICATION: Allergies as of 09/28/2023       Reactions   Firvanq [vancomycin] Swelling   Facial swelling   Iodine-131 Anaphylaxis, Shortness Of Breath, Swelling   Ivp Dye [iodinated Contrast Media] Anaphylaxis   Skin redness Difficulty walking   Toradol [ketorolac Tromethamine] Shortness Of Breath   Flares ulcers   Tylenol [acetaminophen] Other (See Comments)   Hx of cirrhosis   Neurontin [gabapentin] Itching   Nsaids  Other (See Comments)   Flares ulcers   Suboxone [buprenorphine Hcl-naloxone Hcl] Other (See Comments)   Withdrawal symptoms         Medication List     STOP taking these medications    amoxicillin-clavulanate 875-125 MG tablet Commonly known as: AUGMENTIN   doxycycline 100 MG capsule Commonly known as: VIBRAMYCIN   enoxaparin 100 MG/ML injection Commonly known as: LOVENOX   magic mouthwash (nystatin, hydrocortisone, diphenhydrAMINE, lidocaine) suspension       TAKE these medications    Accu-Chek Guide test strip Generic drug: glucose blood Use as instructed to check blood glucose four times daily   acetaminophen 500 MG tablet Commonly known as: TYLENOL Take 1,000 mg by mouth 2 (two) times daily as needed for moderate pain (pain score 4-6) or headache.   albuterol 108 (90 Base) MCG/ACT inhaler Commonly known as: VENTOLIN HFA Inhale 2 puffs into the lungs every 6 (six) hours as needed for wheezing or shortness of breath.   apixaban 5 MG Tabs tablet Commonly known as: ELIQUIS Take 2 tablets (10 mg total) by mouth 2 (two) times daily for 6 days, THEN 1 tablet (5 mg total) 2 (two) times daily. Start taking on: September 28, 2023 What changed: See the new instructions.   ARIPiprazole 20 MG tablet Commonly known as: ABILIFY Take 20 mg by mouth daily.   clonazePAM 0.5 MG tablet Commonly known as: KLONOPIN Take 0.5 mg by mouth 2 (two) times daily as needed for anxiety.  Dexcom G6 Transmitter Misc USE AS DIRECTED.   famotidine 20 MG tablet Commonly known as: PEPCID Take 1 tablet (20 mg total) by mouth at bedtime. What changed: when to take this   ferrous sulfate 325 (65 FE) MG EC tablet Take 1 tablet (325 mg total) by mouth daily with breakfast.   glipiZIDE 5 MG 24 hr tablet Commonly known as: GLUCOTROL XL take 1 tablet by mouth daily with breakfast.   HumuLIN R U-500 KwikPen 500 UNIT/ML KwikPen Generic drug: insulin regular human CONCENTRATED Inject 90  Units into the skin 3 (three) times daily with meals. What changed:  how much to take when to take this additional instructions   hydrOXYzine 50 MG capsule Commonly known as: VISTARIL Take 1 capsule (50 mg total) by mouth 3 (three) times daily as needed.   hyoscyamine 0.125 MG Tbdp disintergrating tablet Commonly known as: ANASPAZ TAKE 1 TABLET UNDER THE TONGUE THREE TIMES A DAY AS NEEDED FOR BLADDER SPASMS OR CRAMPING.   lidocaine 5 % ointment Commonly known as: XYLOCAINE Apply 1 Application topically as needed. What changed:  when to take this reasons to take this   lisinopril 5 MG tablet Commonly known as: ZESTRIL Take 5 mg by mouth daily.   metFORMIN 1000 MG tablet Commonly known as: GLUCOPHAGE take 1 tablet by mouth 2 times a day with a meal. What changed: See the new instructions.   methadone 10 MG/ML solution Commonly known as: DOLOPHINE Take 65 mg by mouth daily.   methocarbamol 750 MG tablet Commonly known as: ROBAXIN Take 750 mg by mouth every 6 (six) hours as needed for muscle spasms.   Mounjaro 5 MG/0.5ML Pen Generic drug: tirzepatide Inject 5 mg into the skin every Monday.   nicotine 21 mg/24hr patch Commonly known as: NICODERM CQ - dosed in mg/24 hours Place 1 patch (21 mg total) onto the skin daily. Start taking on: September 29, 2023   omeprazole 40 MG capsule Commonly known as: PRILOSEC TAKE 1 CAPSULE BY MOUTH TWICE DAILY.   ondansetron 4 MG tablet Commonly known as: ZOFRAN Take 1 tablet (4 mg total) by mouth every 8 (eight) hours as needed for nausea or vomiting.   Oxycodone HCl 10 MG Tabs Take 1 tablet (10 mg total) by mouth every 6 (six) hours as needed (pain). What changed: when to take this   Pen Needles 32G X 6 MM Misc 1 each by Does not apply route 3 (three) times daily.   potassium chloride 10 MEQ tablet Commonly known as: KLOR-CON Take 10 mEq by mouth 2 (two) times daily.   pregabalin 75 MG capsule Commonly known as:  LYRICA Take 75 mg by mouth 3 (three) times daily.   propranolol 10 MG tablet Commonly known as: INDERAL Take 1 tablet (10 mg total) by mouth 2 (two) times daily. What changed: when to take this   QUEtiapine 25 MG tablet Commonly known as: SEROQUEL Take 25 mg by mouth at bedtime.   torsemide 20 MG tablet Commonly known as: DEMADEX Take 2 tablets (40 mg total) by mouth daily. What changed:  how much to take when to take this   venlafaxine XR 75 MG 24 hr capsule Commonly known as: Effexor XR Take 1 capsule (75 mg total) by mouth daily with breakfast.        Discharge Exam: Filed Weights   09/25/23 1239 09/25/23 2323  Weight: 113.4 kg 114.6 kg    General exam: Alert, awake, oriented x 3; obese; expressing no chest pain  and complaining of insomnia.  At time of examination no requiring oxygen supplementation, but per nursing report has been experiencing on and off need of oxygen. Respiratory system: No wheezing, no crackles.  No using accessory muscles Cardiovascular system:RRR. No murmurs, rubs orgallops. Gastrointestinal system: Abdomen is obese, nondistended, soft and nontender. No organomegaly or masses felt. Normal bowel sounds heard. Central nervous system: No focal neurological deficits. Extremities: No cyanosis or clubbing. Skin: No petechiae. Psychiatry: Judgement and insight appear normal. Mood & affect appropriate.     Condition at discharge: Stable and improved.  The results of significant diagnostics from this hospitalization (including imaging, microbiology, ancillary and laboratory) are listed below for reference.   Imaging Studies: ECHOCARDIOGRAM COMPLETE  Result Date: 09/26/2023    ECHOCARDIOGRAM REPORT   Patient Name:   KORINA MACIAS Date of Exam: 09/26/2023 Medical Rec #:  [id number]        Height:       69.0 in Accession #:    [id number]       Weight:       252.6 lb Date of Birth:  [birth year]         BSA:          2.282 m Patient Age:    37 years          BP:           106/70 mmHg Patient Gender: F                HR:           99 bpm. [facility] Procedure: 2D Echo, Cardiac Doppler and Color Doppler Indications:    Pulmonary Embolus I26.09  History:        Patient has prior history of Echocardiogram examinations, most                 recent 07/31/2020. COPD; Risk Factors:Diabetes, Dyslipidemia and                 Current Smoker.  Sonographer:    Celesta Gentile RCS Referring Phys: [id number] ASIA B ZIERLE-GHOSH IMPRESSIONS  1. Left ventricular ejection fraction, by estimation, is 55 to 60%. The left ventricle has normal function. The left ventricle has no regional wall motion abnormalities. Left ventricular diastolic parameters were normal.  2. Right ventricular systolic function is normal. The right ventricular size is normal.  3. The mitral valve is normal in structure. No evidence of mitral valve regurgitation. No evidence of mitral stenosis.  4. The aortic valve is normal in structure. Aortic valve regurgitation is not visualized. No aortic stenosis is present.  5. The inferior vena cava is normal in size with greater than 50% respiratory variability, suggesting right atrial pressure of 3 mmHg. FINDINGS  Left Ventricle: Left ventricular ejection fraction, by estimation, is 55 to 60%. The left ventricle has normal function. The left ventricle has no regional wall motion abnormalities. The left ventricular internal cavity size was normal in size. There is  no left ventricular hypertrophy. Left ventricular diastolic parameters were normal. Right Ventricle: The right ventricular size is normal. No increase in right ventricular wall thickness. Right ventricular systolic function is normal. Left Atrium: Left atrial size was normal in size. Right Atrium: Right atrial size was normal in size. Pericardium: There is no evidence of pericardial effusion. Mitral Valve: The mitral valve is normal in structure. No evidence of mitral valve regurgitation. No evidence  of mitral valve stenosis. Tricuspid Valve: The tricuspid valve  is normal in structure. Tricuspid valve regurgitation is not demonstrated. No evidence of tricuspid stenosis. Aortic Valve: The aortic valve is normal in structure. Aortic valve regurgitation is not visualized. No aortic stenosis is present. Aortic valve mean gradient measures 11.0 mmHg. Aortic valve peak gradient measures 17.5 mmHg. Aortic valve area, by VTI measures 2.51 cm. Pulmonic Valve: The pulmonic valve was normal in structure. Pulmonic valve regurgitation is not visualized. No evidence of pulmonic stenosis. Aorta: The aortic root is normal in size and structure. Venous: The inferior vena cava is normal in size with greater than 50% respiratory variability, suggesting right atrial pressure of 3 mmHg. IAS/Shunts: No atrial level shunt detected by color flow Doppler.  LEFT VENTRICLE PLAX 2D LVIDd:         4.10 cm   Diastology LVIDs:         2.40 cm   LV e' medial:    9.25 cm/s LV PW:         1.10 cm   LV E/e' medial:  12.2 LV IVS:        1.00 cm   LV e' lateral:   11.40 cm/s LVOT diam:     2.00 cm   LV E/e' lateral: 9.9 LV SV:         111 LV SV Index:   49 LVOT Area:     3.14 cm  RIGHT VENTRICLE RV S prime:     15.60 cm/s TAPSE (M-mode): 2.6 cm LEFT ATRIUM             Index        RIGHT ATRIUM           Index LA diam:        3.80 cm 1.67 cm/m   RA Area:     15.20 cm LA Vol (A2C):   53.2 ml 23.32 ml/m  RA Volume:   36.30 ml  15.91 ml/m LA Vol (A4C):   48.9 ml 21.43 ml/m LA Biplane Vol: 51.0 ml 22.35 ml/m  AORTIC VALVE AV Area (Vmax):    2.62 cm AV Area (Vmean):   2.46 cm AV Area (VTI):     2.51 cm AV Vmax:           209.00 cm/s AV Vmean:          151.000 cm/s AV VTI:            0.443 m AV Peak Grad:      17.5 mmHg AV Mean Grad:      11.0 mmHg LVOT Vmax:         174.00 cm/s LVOT Vmean:        118.000 cm/s LVOT VTI:          0.354 m LVOT/AV VTI ratio: 0.80  AORTA Ao Root diam: 3.60 cm MITRAL VALVE MV Area (PHT): 2.95 cm     SHUNTS MV Decel  Time: 257 msec     Systemic VTI:  0.35 m MV E velocity: 113.00 cm/s  Systemic Diam: 2.00 cm MV A velocity: 92.20 cm/s MV E/A ratio:  1.23 Donato Schultz MD Electronically signed by Donato Schultz MD Signature Date/Time: 09/26/2023/1:47:34 PM    Final    US Venous Img Lower Unilateral Left (DVT)  Result Date: 09/26/2023 CLINICAL DATA:  Left leg swelling.  History of DVT. EXAM: LEFT LOWER EXTREMITY VENOUS DOPPLER ULTRASOUND TECHNIQUE: Gray-scale sonography with compression, as well as color and duplex ultrasound, were performed to evaluate the deep venous system(s) from the level  of the common femoral vein through the popliteal and proximal calf veins. COMPARISON:  CT venogram yesterday. Left lower extremity DVT ultrasound dated September 13, 2023. FINDINGS: VENOUS Occlusive thrombus within the left common femoral vein. Nonocclusive thrombus within the profunda femoral vein, femoral vein, and popliteal vein. Normal compressibility of the saphenofemoral junction and posterior tibial vein. Peroneal vein is not visualized. Limited views of the contralateral common femoral vein are unremarkable. OTHER None. Limitations: None. IMPRESSION: 1. Continued left lower extremity DVT from the left common femoral vein to the popliteal vein. DVT is occlusive within the common femoral vein. Electronically Signed   By: Obie Dredge M.D.   On: 09/26/2023 12:34   CT VENOGRAM ABD/PEL  Result Date: 09/25/2023 CLINICAL DATA:  Caval thrombus and left lower extremity DVT. Follow-up examination EXAM: CT VENOGRAM ABDOMEN AND PELVIS TECHNIQUE: Venographic phase images of the abdomen and pelvis were obtained following the administration of intravenous contrast. Multiplanar reformats and maximum intensity projections were generated. RADIATION DOSE REDUCTION: This exam was performed according to the departmental dose-optimization program which includes automated exposure control, adjustment of the mA and/or kV according to patient size  and/or use of iterative reconstruction technique. CONTRAST:  OMNIPAQUE IOHEXOL 350 MG/ML SOLN COMPARISON:  09/15/2023 FINDINGS: Small left pleural effusion. Stable left basilar collapse and consolidation. Cardiac size within normal limits. Small hiatal hernia. Gastroesophageal varices noted. Infiltrative changes surrounding the uncinate process of the pancreas extending into the mesenteric root appears slightly progressive since prior examination and may represent retroperitoneal edema or a superimposed inflammatory process suggest acute interstitial/edematous pancreatitis. Fatty atrophy of the pancreas again noted. Otherwise normal enhancement of the pancreatic parenchyma. The pancreatic duct is not dilated. No loculated peripancreatic fluid collections are identified. 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 Approximately 2/3 of the splenic parenchyma appears devascularized. Stable splenomegaly. The adrenal glands are unremarkable. The kidneys are normal. Bladder is unremarkable. Progressive infiltrative changes surround the third and fourth portion the duodenum which may relate to retroperitoneal edema or inflammatory process following the uncinate process of the pancreas. Moderate ascites again noted, stable since prior examination. The stomach, small bowel, and large bowel are otherwise unremarkable. The appendix is not clearly identified, however, no secondary signs of appendicitis are seen within the right lower quadrant. Inferior vena cava filter is in place within the infrarenal cava, unchanged from prior examination. Previously noted caval thrombus has resolved. 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 Previously noted superimposed acute intraluminal thrombus, however, has resolved and  the left common femoral, external iliac, and common iliac veins appear patent with inline flow. Small residual filling defect identified within the visualized left femoral vein in keeping with DVT in this location. Pelvic organs are unremarkable. Diffuse subcutaneous body wall edema has progressed slightly in the interval since prior examination. Asymmetric circumferential subcutaneous edema within the left lower extremity is again identified likely related to impaired venous return given left femoral DVT and left external iliac venous stenosis. No acute bone abnormality.  No lytic or blastic bone lesion. IMPRESSION: 1. Interval resolution of previously noted caval thrombus. 2. Interval resolution of previously noted acute intraluminal thrombus within the left common femoral, external iliac, and common iliac veins. Inline flow reestablished though residual high-grade stenosis of the left external iliac vein, related to remote thrombosis, again noted. The 3. Small residual  filling defect within the visualized left femoral vein in keeping with DVT in this location. 4. Infiltrative changes surrounding the uncinate process of the pancreas extending into the mesenteric root appears slightly progressive since prior examination and may represent retroperitoneal edema or a superimposed inflammatory process suggest acute interstitial/edematous pancreatitis. Correlation with serum lipase would be helpful in differentiating these entities. 5. Stable small left pleural effusion. Stable left basilar collapse and consolidation. 6. Stable moderate ascites. 7. Stable splenomegaly. 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 8. Diffuse subcutaneous body wall edema has progressed slightly in the interval since prior examination. Asymmetric circumferential subcutaneous edema within the left lower extremity is again identified likely related to impaired venous return given left femoral  DVT and left external iliac venous stenosis. Electronically Signed   By: Helyn Numbers M.D.   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 Now presenting with left leg swelling, warmth, pain, and shortness of breath. PE suspected. EXAM: CT ANGIOGRAPHY CHEST WITH CONTRAST TECHNIQUE: Multidetector CT imaging of the chest was performed using the standard protocol during bolus administration of intravenous contrast. Multiplanar CT image reconstructions and MIPs were obtained to evaluate the vascular anatomy. RADIATION DOSE REDUCTION: This exam was performed according to the departmental dose-optimization program which includes automated exposure control, adjustment of the mA and/or kV according to patient size and/or use of iterative reconstruction technique. CONTRAST:  OMNIPAQUE IOHEXOL 350 MG/ML SOLN COMPARISON:  None Available. FINDINGS: Cardiovascular: Acute segmental and subsegmental pulmonary emboli in the right upper lobe (series 4/image 105). There is evidence of right heart strain with RV: LV ratio of 1.2. There is mild flattening of the interventricular septum. No pericardial effusion. Mediastinum/Nodes: Mild wall thickening of the distal esophagus and paraesophageal varices. Trachea is unremarkable. No thoracic adenopathy. Lungs/Pleura: Consolidation in the left lower lobe may be due to atelectasis or pneumonia and is similar to prior. Small left pleural effusion. No pneumothorax. Upper Abdomen: Nodular contour of the liver compatible with cirrhosis. Cholecystectomy. Upper abdominal ascites. Heterogenous enhancement of the spleen which is enlarged. Musculoskeletal: No acute fracture. Review of the MIP images confirms the above findings. IMPRESSION: 1. Acute segmental and subsegmental pulmonary emboli in the right upper lobe.  Positive for acute PE with CT evidence of right heart strain (RV/LV Ratio = 1.2) consistent with at least submassive (intermediate risk) PE. The presence of right heart strain has been associated with an increased risk of morbidity and mortality. Please refer to the "Code PE Focused" order set in EPIC. 2. Consolidation in the left lower lobe may be due to atelectasis or pneumonia. Small left pleural effusion. 3. Cirrhosis with upper abdominal ascites. See separate report for findings in the abdomen. 4. Mild wall thickening of the distal esophagus may be due to venous congestion or esophagitis. Paraesophageal varices. Critical Value/emergent results were called by telephone at the time of interpretation on 09/25/2023 at 8:36 pm to provider PA [facility], who verbally acknowledged these results. Electronically Signed   By: Minerva Fester M.D.   On: 09/25/2023 20:39   DG Chest Port 1 View  Result Date: 09/25/2023 CLINICAL DATA:  dyspnea EXAM: PORTABLE CHEST 1 VIEW COMPARISON:  September 14, 2023 FINDINGS: The cardiomediastinal silhouette is unchanged in contour. Trace residual LEFT pleural effusion. No pneumothorax. Favored overall improved aeration at the LEFT  lung base with minimal linear opacity noted. IMPRESSION: Trace residual LEFT pleural effusion. No pneumothorax. Electronically Signed   By: Meda Klinefelter M.D.   On: 09/25/2023 13:46   PERIPHERAL VASCULAR CATHETERIZATION  Result Date: 09/16/2023 Images from the original result were not included. 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 female, who presented to the [facility] emergency department yesterday with worsening pain and swelling in her left leg.  The [facility] for a left leg DVT.  This was acute on chronic as she had had a DVT back in March.  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  She also underwent splenic embolization.  Her platelet count has improved.  Her pain and swelling has gotten worse.  She does have a IVC filter in place that was inserted 3 weeks ago.  While at [facility], a CT venogram revealed thrombus within and above her IVC filter as well as what appears to be acute thrombus within her left iliac system as well as the femoral-popliteal veins.  Findings: Subacute clot removed from IVC, left common, external iliac and common femoral veins.  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  Procedure:  The patient was identified in the holding area and taken to the cath lab  The patient was then placed supine on the table and prepped and draped in the usual sterile fashion.  A time out was called.  Ultrasound was used to evaluate the right internal jugular vein which was patent.  Under ultrasound guidance the area was anesthetized with lidocaine and the micropuncture access needle was used to access the right IJ.  The 018 wire was placed through this and into the right atrium.  The micro sheath was then placed over this.  A glide advantage wire was used to navigate through the right atrium and into the IVC.  The micro sheath was then exchanged for an 8 [place] sheath.  An ACT was then drawn and she was on a heparin drip which resulted to be subtherapeutic so an 8000 unit bolus of heparin  was given.  I then attempted multiple times to cannulate the left femoral vein from this IJ access site although since there was a patent internal iliac vein and pelvis circulation I cannot get the wire to cross into the external iliac vein.  The glide advantage wire was placed down to the left internal iliac vein and the 8 French sheath was exchanged for the 16 French dry seal sheath.  A cavogram was obtained which demonstrated on the top of the IVC filter.  The 16 French flash penumbra catheter was used to thrombectomized this area of the IVC filter with 2 passes.  Cavogram demonstrated minimal residual clot at the filter.  The left common femoral vein was then accessed under ultrasound guidance with the micropuncture access needle.  The 018  wire was advanced and the micro sheath was placed over this wire.  An 035 stiff angled glide was then passed through which easily tracked through the left external iliac and common iliac veins and into the IVC.  Using this as a marker I was able to cannulate the left external iliac vein with the glide advantage wire and bear catheter from the right IJ access and a wire was passed down into the left superficial femoral vein.  Venogram demonstrated filling defects with extensive collateralization to the pelvic system.  I then made multiple passes with a 16 French penumbra clearing the common iliac and external iliac veins.  Attention was then turned to the common femoral, superficial and deep femoral veins. The wire was passed into the superficial femoral vein but there was a significant stenosis or occlusion in the 16 French penumbra was unable to pass further than the proximal portion of the superficial femoral vein.  There was also chronic thrombus occluding the deep femoral vein and I was unable to get a wire to pass.  The 16 French penumbra was able to engage although did not result in thrombus extrusion.  Venogram demonstrated flow through the main external iliac vein with  less filling of the collaterals.  My partner Dr. Chestine Spore came in and also assessed the images and we agreed that since the wire would not pass into the deep femoral vein that this was likely a chronic occlusion.  Cavogram then demonstrated more thrombus in the filter so the 16 French penumbra was again passed through the dry seal sheath and 2 more passes were made with good resolution and minimal residual thrombus.  I then attempted to remove the filter with a Cook IVC filter retrieval kit although was unable to snare the hook.  It appeared that the snare leaflets would bounce around the hook leading me to believe that there was still residual thrombus at the top of the filter.  For this reason I elected to leave the filter in place and plan for 3 months of anticoagulation.  The 16 French sheath was removed from the right IJ and manual pressure was held.  The micro sheath in the left common femoral vein was removed and manual pressure was held.  Both access sites were demonstrated excellent hemostasis. Contrast: 125 cc Impression: Acute on chronic thrombus of the left common and external iliac veins.  Chronic occlusions of the left profunda and left superficial femoral veins. Residual thrombus on the IVC filter Plan for 3 months of anticoagulation should her platelets remain stable and with then consider filter removal after that time. [name] MD [facility] [place] Office: [phone]  CT VENOGRAM ABD/PEL  Result Date: 09/15/2023 CLINICAL DATA:  DVT EXAM: CT VENOGRAM ABDOMEN AND PELVIS TECHNIQUE: Venographic phase images of the abdomen, pelvis and lower extremities were obtained following the administration of intravenous contrast. Multiplanar reformats and maximum intensity projections were generated. RADIATION DOSE REDUCTION: This exam was performed according to the departmental dose-optimization program which includes automated exposure control, adjustment of the mA and/or kV  according to patient size and/or use of iterative reconstruction technique. CONTRAST:  OMNIPAQUE IOHEXOL 350 MG/ML SOLN COMPARISON:  Ultrasound 09/13/2023, CT 01/07/2023, 11/08/2021 FINDINGS: Lower chest: See separately dictated chest CT. Small left-sided pleural effusion with consolidation at the left lung base. Hepatobiliary: Nodular hepatic contour suspicious for cirrhosis. Cholecystectomy. No biliary dilatation. Pancreas: Unremarkable. No pancreatic ductal dilatation or surrounding inflammatory changes. Spleen: Massively enlarged, craniocaudal measurement of  26 cm, previously 26 cm. Interval embolization coils at the splenic hilus. Heterogenous splenic enhancement with multiple geographic areas of non enhancement presumably due to infarcts. No evidence for hematoma. Extensive perisplenic collateral vessels. Adrenals/Urinary Tract: Adrenal glands are normal. Kidneys show no hydronephrosis. The bladder is decompressed Stomach/Bowel: The stomach is nonenlarged. There is no dilated small bowel. No acute bowel wall thickening. Vascular/Lymphatic: Nonaneurysmal aorta. No suspicious lymph nodes. Small retroperitoneal lymph nodes. Reproductive: Uterus and bilateral adnexa are unremarkable. Other: No free air. Moderate volume of ascites. Subcutaneous edema within the anterior pannus Musculoskeletal: No acute osseous abnormality IVC: IVC filter with apex at the L3 level. Thrombus within the IVC filter. Extension of thrombus above the filter apex approximately 2.6 cm cranial caudal, extends to just below the right renal vein origin. Portal and mesenteric veins: Small volume thrombus in the portal vein, series 5, image 34. question tiny amount of thrombus within the SMV, series 5, image 45. Bilateral iliac veins: Right iliac vein is patent. Positive for occlusive thrombus within the left common iliac vein and external iliac vein. Right lower extremity: No thrombus within the imaged portions of the right common  femoral, deep femoral or femoral veins. Left lower extremity: Positive for occlusive thrombus within the left common femoral and imaged portions of the proximal left femoral vein. Small volume thrombus within the proximal deep femoral vein. IMPRESSION: 1. Positive for occlusive DVT within the left lower extremity as seen on prior ultrasound imaging. There is extension of occlusive thrombus to involve the left external iliac and common iliac veins. The distal IVC is patent. 2. IVC filter is present. Positive for thrombus within the IVC filter with extension of thrombus about 2.6 cm above the filter apex extending to just below the right renal vein origin. 3. Small volume thrombus in the portal vein. Question tiny amount of thrombus within the SMV. 4. Massively enlarged spleen with interval embolization coils at the splenic hilus. Heterogenous splenic enhancement with multiple geographic areas of non enhancement presumably due to infarcts. No evidence for splenic hematoma. 5. Cirrhosis with evidence for portal hypertension and moderate volume of ascites. 6. Small left-sided pleural effusion with consolidation at the left lung base. Electronically Signed   By: Jasmine Pang M.D.   On: 09/15/2023 23:12   CT Angio Chest PE W and/or Wo Contrast  Result Date: 09/15/2023 CLINICAL DATA:  Shortness of breath EXAM: CT ANGIOGRAPHY CHEST WITH CONTRAST TECHNIQUE: Multidetector CT imaging of the chest was performed using the standard protocol during bolus administration of intravenous contrast. Multiplanar CT image reconstructions and MIPs were obtained to evaluate the vascular anatomy. RADIATION DOSE REDUCTION: This exam was performed according to the departmental dose-optimization program which includes automated exposure control, adjustment of the mA and/or kV according to patient size and/or use of iterative reconstruction technique. CONTRAST:  OMNIPAQUE IOHEXOL 350 MG/ML SOLN COMPARISON:  Chest x-ray 09/14/2023, CT  chest 07/29/2020 FINDINGS: Cardiovascular: Suboptimal opacification of the pulmonary arterial system. No definite acute pulmonary embolus to the segmental level. Nonaneurysmal aorta. Cardiac size within normal limits. No pericardial effusion Mediastinum/Nodes: No enlarged mediastinal, hilar, or axillary lymph nodes. Thyroid gland, trachea, and esophagus demonstrate no significant findings. Lungs/Pleura: Small left-sided pleural effusion. Consolidative airspace disease in the left lower lobe. Small ground-glass focus at the right apex. Upper Abdomen: See separately dictated CT Musculoskeletal: No acute osseous abnormality Review of the MIP images confirms the above findings. IMPRESSION: 1. Suboptimal opacification of the pulmonary arterial system. No definite acute pulmonary embolus to  the segmental level. 2. Small left-sided pleural effusion with consolidative airspace disease in the left lower lobe, either representing atelectasis or pneumonia. Small ground-glass focus at the right apex, suspect infectious in etiology. Electronically Signed   By: Jasmine Pang M.D.   On: 09/15/2023 22:52   DG Chest 2 View  Result Date: 09/14/2023 CLINICAL DATA:  SOB EXAM: CHEST - 2 VIEW COMPARISON:  X-ray 01/16/2023, CT chest 07/29/2020 FINDINGS: The heart and mediastinal contours are within normal limits. Bibasilar atelectasis. No focal consolidation. No pulmonary edema. No pleural effusion. No pneumothorax. No acute osseous abnormality. IMPRESSION: No active cardiopulmonary disease. Electronically Signed   By: Tish Frederickson M.D.   On: 09/14/2023 19:52   US Venous Img Lower Unilateral Left  Result Date: 09/13/2023 CLINICAL DATA:  f/u left popliteal DVT EXAM: LEFT LOWER EXTREMITY VENOUS DOPPLER ULTRASOUND TECHNIQUE: Gray-scale sonography with compression, as well as color and duplex ultrasound, were performed to evaluate the deep venous system(s) from the level of the common femoral vein through the popliteal and  proximal calf veins. COMPARISON:  Lower extremity venous duplex, 07/30/2020. CT AP, 03/09/2023. FINDINGS: VENOUS Heterogeneously-echogenic, occlusive filling defect within the imaged portions of the LEFT common femoral, superficial femoral, and popliteal veins. Extension to include imaged portions of profunda femoral and great saphenous vein. Difficult visualization of the calf veins. Limited views of the contralateral common femoral vein are unremarkable. OTHER No evidence abnormal fluid collection. Subcutaneous edema of the imaged distal LEFT lower extremity Limitations: none IMPRESSION: Acute, occlusive and extensive LEFT lower extremity DVT. The central extension of the thrombus is not visualized on this evaluation. Recommend CT venogram abdomen pelvis to evaluate/rule out pelvic versus caval extension, and consider consultation by vascular interventional specialist for potential further evaluation and management. These results will be called to the ordering clinician or representative by the Radiologist Assistant, and communication documented in the PACS or [employer]. Roanna Banning, MD [facility] Radiology Electronically Signed   By: Roanna Banning M.D.   On: 09/13/2023 10:30    Microbiology: Results for orders placed or performed during the hospital encounter of 09/25/23  Resp panel by RT-PCR (RSV, Flu A&B, Covid) Anterior Nasal Swab     Status: None   Collection Time: 09/25/23  2:16 PM   Specimen: Anterior Nasal Swab  Result Value Ref Range Status   SARS Coronavirus 2 by RT PCR NEGATIVE NEGATIVE Final    Comment: (NOTE) SARS-CoV-2 target nucleic acids are NOT DETECTED.  The SARS-CoV-2 RNA is generally detectable in upper respiratory specimens during the acute phase of infection. The lowest concentration of SARS-CoV-2 viral copies this assay can detect is 138 copies/mL. A negative result does not preclude SARS-Cov-2 infection and should not  be used as the sole basis for treatment or other patient management decisions. A negative result may occur with  improper specimen collection/handling, submission of specimen other than nasopharyngeal swab, presence of viral mutation(s) within the areas targeted by this assay, and inadequate number of viral copies(<138 copies/mL). A negative result must be combined with clinical observations, patient history, and epidemiological information. The expected result is Negative.  Fact Sheet for Patients:  BloggerCourse.com  Fact Sheet for Healthcare Providers:  SeriousBroker.it  This test is no t yet approved or cleared by the [place] FDA and  has been authorized for detection and/or diagnosis of SARS-CoV-2 by FDA under an Emergency Use Authorization (EUA). This EUA will remain  in effect (meaning this test can be used) for  the duration of the COVID-19 declaration under Section 564(b)(1) of the Act, 21 U.S.C.section 360bbb-3(b)(1), unless the authorization is terminated  or revoked sooner.       Influenza A by PCR NEGATIVE NEGATIVE Final   Influenza B by PCR NEGATIVE NEGATIVE Final    Comment: (NOTE) The Xpert Xpress SARS-CoV-2/FLU/RSV plus assay is intended as an aid in the diagnosis of influenza from Nasopharyngeal swab specimens and should not be used as a sole basis for treatment. Nasal washings and aspirates are unacceptable for Xpert Xpress SARS-CoV-2/FLU/RSV testing.  Fact Sheet for Patients: BloggerCourse.com  Fact Sheet for Healthcare Providers: SeriousBroker.it  This test is not yet approved or cleared by the [place] FDA and has been authorized for detection and/or diagnosis of SARS-CoV-2 by FDA under an Emergency Use Authorization (EUA). This EUA will remain in effect (meaning this test can be used) for the duration of the COVID-19 declaration under Section  564(b)(1) of the Act, 21 U.S.C. section 360bbb-3(b)(1), unless the authorization is terminated or revoked.     Resp Syncytial Virus by PCR NEGATIVE NEGATIVE Final    Comment: (NOTE) Fact Sheet for Patients: BloggerCourse.com  Fact Sheet for Healthcare Providers: SeriousBroker.it  This test is not yet approved or cleared by the [place] FDA and has been authorized for detection and/or diagnosis of SARS-CoV-2 by FDA under an Emergency Use Authorization (EUA). This EUA will remain in effect (meaning this test can be used) for the duration of the COVID-19 declaration under Section 564(b)(1) of the Act, 21 U.S.C. section 360bbb-3(b)(1), unless the authorization is terminated or revoked.  Performed at [facility], [address], [place], [place] [address]     Labs: CBC: Recent Labs  Lab 09/25/23 1330 09/26/23 0344 09/27/23 0732 09/28/23 0438  WBC 5.3 5.7 6.4 5.2  NEUTROABS 3.8 4.8  --   --   HGB 10.4* 10.5* 10.5* 10.4*  HCT 32.6* 33.0* 35.2* 33.1*  MCV 91.1 90.9 94.9 93.0  PLT 116* 122* 121* 121*   Basic Metabolic Panel: Recent Labs  Lab 09/25/23 1330 09/25/23 2113 09/26/23 0344 09/27/23 0732 09/28/23 0438  NA 136 134* 135 136 137  K 3.5 4.3 4.3 4.0 4.0  CL 106 104 103 104 104  CO2 23 24 24 26 24   GLUCOSE 240* 306* 345* 173* 249*  BUN 6 7 6 6 7   CREATININE 0.39* 0.46 0.48 0.51 0.59  CALCIUM 7.9* 8.0* 8.0* 7.9* 8.1*  MG  --   --  1.7  --   --    Liver Function Tests: Recent Labs  Lab 09/25/23 1330 09/26/23 0344  AST 19 20  ALT 19 18  ALKPHOS 152* 150*  BILITOT 1.0 0.9  PROT 5.9* 5.6*  ALBUMIN 2.5* 2.3*   CBG: Recent Labs  Lab 09/27/23 1116 09/27/23 1620 09/27/23 1941 09/28/23 0733 09/28/23 1114  GLUCAP 160* 220* 289* 216* 229*    Discharge time spent: greater than 30 minutes.  Signed: Vassie Loll, MD [facility] 09/28/2023

## 2023-09-28 NOTE — Telephone Encounter (Signed)
Patient currently admitted to the hospital. We have not been consulted to see her but Dr. Levon Hedger has recommended getting her scheduled for follow-up in a couple of weeks due to reports of rectal bleeding.

## 2023-09-29 ENCOUNTER — Other Ambulatory Visit (HOSPITAL_COMMUNITY): Payer: Self-pay

## 2023-09-29 ENCOUNTER — Telehealth: Payer: Self-pay | Admitting: Family Medicine

## 2023-09-29 NOTE — Telephone Encounter (Signed)
Copied from CRM [id number]. Topic: Clinical - Medication Refill >> Sep 29, 2023 12:44 PM Herbert Seta B wrote: Most Recent Primary Care Visit:  Provider: [facility]  Department: [facility]  Visit Type: ACUTE  Date: 09/14/2023  Medication:  Oxycodone HCl 10 MG TABS    Has the patient contacted their pharmacy? Yes-PRIOR AUTHORIZATION NEEDED (Agent: If no, request that the patient contact the pharmacy for the refill. If patient does not wish to contact the pharmacy document the reason why and proceed with request.) (Agent: If yes, when and what did the pharmacy advise?)  Is this the correct pharmacy for this prescription? yes If no, delete pharmacy and type the correct one.  This is the patient's preferred pharmacy:  [facility] [address], [place] - [address] [place] [address] [place] [address] Phone: [phone] Fax: [phone]    Has the prescription been filled recently? yes  Is the patient out of the medication? yes  Has the patient been seen for an appointment in the last year OR does the patient have an upcoming appointment? yes  Can we respond through MyChart? yes  Agent: Please be advised that Rx refills may take up to 3 business days. We ask that you follow-up with your pharmacy.

## 2023-10-01 ENCOUNTER — Telehealth (HOSPITAL_COMMUNITY): Payer: Self-pay | Admitting: Pharmacy Technician

## 2023-10-01 NOTE — Telephone Encounter (Signed)
Pharmacy Patient Advocate Encounter   Received notification from Fax that prior authorization for oxyCODONE HCl 10MG  tablets is required/requested.   Insurance verification completed.   The patient is insured through [employer] [place] .   Per test claim: PA required; PA submitted to above mentioned insurance via CoverMyMeds Key/confirmation #/EOC [id number] Status is pending

## 2023-10-04 ENCOUNTER — Other Ambulatory Visit (HOSPITAL_COMMUNITY): Payer: Self-pay

## 2023-10-04 NOTE — Telephone Encounter (Signed)
Pharmacy Patient Advocate Encounter  Received notification from [facility] that Prior Authorization for oxyCODONE HCl 10MG  tablets  has been APPROVED from 10/01/2023 to 03/29/2024. Ran test claim, Copay is $0.00. This test claim was processed through [facility]- copay amounts may vary at other pharmacies due to pharmacy/plan contracts, or as the patient moves through the different stages of their insurance plan.   PA #/Case ID/Reference #: [id number]

## 2023-10-04 NOTE — Patient Instructions (Signed)

## 2023-10-04 NOTE — Progress Notes (Unsigned)
   Established Patient Office Visit   Subjective  Patient ID: Jaime [name], female    DOB: Aug 01, 1986  Age: 37 y.o. MRN: [id number]  No chief complaint on file.   She  has a past medical history of Acute encephalopathy (08/31/2020), Adjustment disorder with depressed mood (09/07/2020), Anxiety, Asthma, Chronic abdominal pain, Chronic back pain, Chronic prescription opiate use (02/16/2022), Cirrhosis (HCC), Cirrhosis (HCC), COPD (chronic obstructive pulmonary disease) (HCC), Depression, Diabetes mellitus without complication (HCC), Diabetes mellitus, type II (HCC), Diverticulosis, DVT (deep venous thrombosis) (HCC) (02/04/2023), Fatty liver (03/07/2020), GERD (gastroesophageal reflux disease), Hepatitis C, Hernia, abdominal, Hyperglycemia due to type 2 diabetes mellitus (HCC) (05/24/2020), Hyperlipidemia, IBS (irritable bowel syndrome) (02/16/2022), Insomnia, 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  HPI  ROS    Objective:     LMP  (LMP Unknown)  {Vitals History (Optional):23777}  Physical Exam   No results found for any visits on 10/05/23.  The ASCVD Risk score (Arnett DK, et al., 2019) failed to calculate for the following reasons:   The 2019 ASCVD risk score is only valid for ages 72 to 78    Assessment & Plan:  There are no diagnoses linked to this encounter.  No follow-ups on file.   Cruzita Lederer Newman Nip, FNP

## 2023-10-05 ENCOUNTER — Ambulatory Visit (INDEPENDENT_AMBULATORY_CARE_PROVIDER_SITE_OTHER): Payer: MEDICAID | Admitting: Family Medicine

## 2023-10-05 ENCOUNTER — Encounter: Payer: Self-pay | Admitting: Family Medicine

## 2023-10-05 VITALS — BP 97/66 | HR 88 | Ht 69.0 in | Wt 231.0 lb

## 2023-10-05 DIAGNOSIS — F322 Major depressive disorder, single episode, severe without psychotic features: Secondary | ICD-10-CM | POA: Diagnosis not present

## 2023-10-05 DIAGNOSIS — G4700 Insomnia, unspecified: Secondary | ICD-10-CM | POA: Diagnosis not present

## 2023-10-05 DIAGNOSIS — Z09 Encounter for follow-up examination after completed treatment for conditions other than malignant neoplasm: Secondary | ICD-10-CM | POA: Insufficient documentation

## 2023-10-05 DIAGNOSIS — F321 Major depressive disorder, single episode, moderate: Secondary | ICD-10-CM

## 2023-10-05 MED ORDER — VENLAFAXINE HCL ER 150 MG PO CP24: 150.0000 mg | ORAL_CAPSULE | Freq: Every day | ORAL | 3 refills | Status: DC

## 2023-10-05 MED ORDER — QUETIAPINE FUMARATE 50 MG PO TABS: 50.0000 mg | ORAL_TABLET | Freq: Every day | ORAL | 2 refills | Status: DC

## 2023-10-05 MED ORDER — ARIPIPRAZOLE 20 MG PO TABS: 20.0000 mg | ORAL_TABLET | Freq: Every day | ORAL | 2 refills | Status: DC

## 2023-10-05 NOTE — Assessment & Plan Note (Signed)
Stop Bang score 4 Referral placed to sleep studies for further evaluation Seroquel 50 mg at bedtime Explained to go to bed at the same time each night and get up at the same time each morning, including on the weekends. Make sure your bedroom is quiet, dark, relaxing, and at a comfortable temperature. Remove electronic devices, such as TVs, computers, and smart phones, from the bedroom.

## 2023-10-05 NOTE — Assessment & Plan Note (Addendum)
BMP and CBC labs ordered  Patient reports feeling better after hospital discharge and following medication management Patient continues to tolerate Eliquis well and has no reported signs of bleeding or new thromboembolic events. Follow-up appointments with hematology have been scheduled. Discussed the importance of medication compliance, recognizing signs of bleeding, and maintaining hydration and activity levels was reinforced

## 2023-10-05 NOTE — Assessment & Plan Note (Signed)
Flowsheet Row Office Visit from 10/05/2023 in [facility]  PHQ-9 Total Score 24     Not controlled Current regimen includes: Effexor 150 mg once daily, Seroquel 50 mg at bedtime and Abilify mg once daily Referral to Psychiatry for medication managementand CBT   We discussed several non-pharmacological approaches to managing depression, including:  Establishing a consistent daily routine: This helps create structure and stability. Practicing mindfulness and relaxation techniques: Incorporating meditation, deep breathing exercises, or yoga to manage stress and improve emotional well-being. Engaging in regular physical activity: Aim for at least 30 minutes of exercise most days to boost mood and energy levels. Spending time outdoors: Exposure to natural light and fresh air can improve mental health. Building a support network: Encouraging social connections with friends, family, or support groups to reduce feelings of isolation. Prioritizing a balanced diet: Eating nutrient-rich foods while avoiding excessive amounts of processed foods, sugar, and unhealthy fats.

## 2023-10-06 ENCOUNTER — Encounter: Payer: Self-pay | Admitting: Women's Health

## 2023-10-06 ENCOUNTER — Ambulatory Visit (INDEPENDENT_AMBULATORY_CARE_PROVIDER_SITE_OTHER): Payer: MEDICAID | Admitting: Women's Health

## 2023-10-06 ENCOUNTER — Other Ambulatory Visit (HOSPITAL_COMMUNITY)
Admission: RE | Admit: 2023-10-06 | Discharge: 2023-10-06 | Disposition: A | Discharge status: Home / Self Care | Payer: MEDICAID | Source: Ambulatory Visit | Attending: Women's Health | Admitting: Women's Health

## 2023-10-06 VITALS — BP 108/62 | HR 78 | Ht 69.0 in | Wt 228.0 lb

## 2023-10-06 DIAGNOSIS — Z124 Encounter for screening for malignant neoplasm of cervix: Secondary | ICD-10-CM | POA: Diagnosis present

## 2023-10-06 DIAGNOSIS — Z3046 Encounter for surveillance of implantable subdermal contraceptive: Secondary | ICD-10-CM | POA: Diagnosis not present

## 2023-10-06 DIAGNOSIS — Z30430 Encounter for insertion of intrauterine contraceptive device: Secondary | ICD-10-CM | POA: Insufficient documentation

## 2023-10-06 LAB — BMP8+EGFR
BUN/Creatinine Ratio: 11 (ref 9–23)
BUN: 7 mg/dL (ref 6–20)
CO2: 25 mmol/L (ref 20–29)
Calcium: 8.9 mg/dL (ref 8.7–10.2)
Chloride: 102 mmol/L (ref 96–106)
Creatinine, Ser: 0.61 mg/dL (ref 0.57–1.00)
Glucose: 117 mg/dL — ABNORMAL HIGH (ref 70–99)
Potassium: 4.3 mmol/L (ref 3.5–5.2)
Sodium: 140 mmol/L (ref 134–144)
eGFR: 117 mL/min/{1.73_m2} (ref >59)

## 2023-10-06 LAB — CBC WITH DIFFERENTIAL/PLATELET
Basophils Absolute: 0.1 10*3/uL (ref 0.0–0.2)
Basos: 1 %
EOS (ABSOLUTE): 0.2 10*3/uL (ref 0.0–0.4)
Eos: 3 %
Hematocrit: 38.3 % (ref 34.0–46.6)
Hemoglobin: 12 g/dL (ref 11.1–15.9)
Immature Grans (Abs): 0.1 10*3/uL (ref 0.0–0.1)
Immature Granulocytes: 1 %
Lymphocytes Absolute: 1.1 10*3/uL (ref 0.7–3.1)
Lymphs: 14 %
MCH: 28.2 pg (ref 26.6–33.0)
MCHC: 31.3 g/dL — ABNORMAL LOW (ref 31.5–35.7)
MCV: 90 fL (ref 79–97)
Monocytes Absolute: 0.6 10*3/uL (ref 0.1–0.9)
Monocytes: 9 %
Neutrophils Absolute: 5.5 10*3/uL (ref 1.4–7.0)
Neutrophils: 72 %
Platelets: 186 10*3/uL (ref 150–450)
RBC: 4.25 x10E6/uL (ref 3.77–5.28)
RDW: 14.4 % (ref 11.7–15.4)
WBC: 7.5 10*3/uL (ref 3.4–10.8)

## 2023-10-06 MED ORDER — PARAGARD INTRAUTERINE COPPER IU IUD
1.0000 | INTRAUTERINE_SYSTEM | Freq: Once | INTRAUTERINE | Status: AC
  Administered 2023-10-06: 1 via INTRAUTERINE

## 2023-10-06 NOTE — Patient Instructions (Signed)
Nexplanon Remove big bandage in 24 hours Remove strips in 3-5 days Keep area clean and dry until strips are off  IUD Nothing in vagina for 3 days (no sex, douching, tampons, etc...) Check your strings once a month to make sure you can feel them, if you are not able to please let us know If you develop a fever of 100.4 or more in the next few weeks, or if you develop severe abdominal pain, please let [place] know Use a backup method of birth control, such as condoms, for 2 weeks   Intrauterine Device Information An intrauterine device (IUD) is a medical device that is inserted into the uterus to prevent pregnancy. It is a small, T-shaped device that has one or two nylon strings hanging down from it. The strings hang out of the lower part of the uterus (cervix) to allow for future IUD removal. There are two types of IUDs: Hormone IUD. This type of IUD is made of plastic and contains the hormone progestin (synthetic progesterone). A hormone IUD may last 3-5 years. Copper IUD. This type of IUD has copper wire wrapped around it. A copper IUD may last up to 10 years. How is an IUD inserted? An IUD is inserted through the vagina, through the cervix, and into the uterus with a minor medical procedure. The procedure for IUD insertion may vary among health care providers and hospitals. How does an IUD work? Synthetic progesterone in a hormonal IUD prevents pregnancy by: Thickening cervical mucus to prevent sperm from entering the uterus. Thinning the uterine lining to prevent a fertilized egg from being implanted there. Copper in a copper IUD prevents pregnancy by making the uterus and fallopian tubes produce a fluid that kills sperm. What are the advantages of an IUD? Advantages of either type of IUD An IUD: Is highly effective in preventing pregnancy. Is reversible. You can become pregnant shortly after the IUD is removed. Is low-maintenance and can stay in place for a long time. Has no  estrogen-related side effects. Can be used when breastfeeding. Is not associated with weight gain. Can be inserted right after childbirth, an abortion, or a miscarriage. Advantages of a hormone IUD If it is inserted within 7 days of your period starting, it works right after it has been inserted. If the hormone IUD is inserted at any other time in your cycle, you will need to use a backup method of birth control for 7 days after insertion. It can make menstrual periods lighter or stop completely. It can reduce menstrual cramping and other discomforts from menstrual periods. It can be used for 3-5 years, depending on which IUD you have. Advantages of a copper IUD It works right after it is inserted. It can be used as a form of emergency birth control if it is inserted within 5 days after having unprotected sex. It does not interfere with your body's natural hormones. It can be used for up to 10 years. What are the disadvantages of an IUD? An IUD may cause irregular menstrual bleeding for a period of time after insertion. It is common to have pain during insertion and have cramping and vaginal bleeding after insertion. An IUD may cut the uterus (uterine perforation) when it is inserted. This is rare. Pelvic inflammatory disease (PID) may happen after insertion of an IUD. PID is an infection in the uterus and fallopian tubes. The IUD does not cause the infection. The infection is usually from an unknown sexually transmitted infection (STI). This is  rare, and it usually happens during the first 20 days after the IUD is inserted. A copper IUD can make your menstrual flow heavier and more painful. IUDs cannot prevent sexually transmitted infections (STIs). How is an IUD removed?  You will lie on your back with your knees bent and your feet in footrests (stirrups). A device will be inserted into your vagina to spread apart the vaginal walls (speculum). This will allow your health care provider to see  the strings attached to the IUD. Your health care provider will use a small instrument (forceps) to grasp the IUD strings and will pull firmly until the IUD is removed. You may have some discomfort when the IUD is removed. Your health care provider may recommend taking over-the-counter pain relievers, such as ibuprofen, before the procedure. You may also have minor spotting for a few days after the procedure. The procedure for IUD removal may vary among health care providers and hospitals. Is an IUD right for me? If you are interested in an IUD, discuss it with your health care provider. He or she will make sure you are a good candidate for an IUD and will let you know more about the advantages, disadvantage, and possible side effects. This will allow you to make a decision about the device. Summary An intrauterine device (IUD) is a medical device that is inserted in the uterus to prevent pregnancy. It is a small, T-shaped device that has one or two nylon strings hanging down from it. A hormone IUD contains the hormone progestin (synthetic progesterone). A copper IUD has copper wire wrapped around it. Synthetic progesterone in a hormone IUD prevents pregnancy by thickening cervical mucus and thinning the walls of the uterus. Copper in a copper IUD prevents pregnancy by making the uterus and fallopian tubes produce a fluid that kills sperm. A hormone IUD can be left in place for 3-5 years. A copper IUD can be left in place for up to 10 years. An IUD is inserted and removed by a health care provider. You may feel some pain during insertion and removal. Your health care provider may recommend taking over-the-counter pain medicine, such as ibuprofen, before an IUD procedure. This information is not intended to replace advice given to you by your health care provider. Make sure you discuss any questions you have with your health care provider. Document Revised: 05/01/2020 Document Reviewed:  05/01/2020 Elsevier Patient Education  2024 [employer].

## 2023-10-06 NOTE — Progress Notes (Signed)
NEXPLANON REMOVAL Patient name: Jaime [name] MRN [id number]  Date of birth: [birth year] Subjective Findings:   Jaime [name] is a 37 y.o. G0P0000 Caucasian female being seen today for removal of a Nexplanon. Her Nexplanon was placed 04/28/23.  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 Originally wanted depo, discussed it is also hormonal and although r/f DVT/PE w/ progesterone contraception is low, it's not impossible, so if they think DVTs/PEs may be from Nexplanon, recommended Paragard IUD. Is not currently sexually active, is getting a divorce and does not plan on having sex, but wants contraception in case. No problems w/ periods when not on birth control. No h/o PCOS. Signed copy of informed consent in chart.   No LMP recorded (lmp unknown). (Menstrual status: IUD). Last pap7/8/21. Results were: NILM w/ HRHPV negative The planned method of family planning is IUD     10/05/2023    9:49 AM 09/14/2023   10:02 AM 07/02/2023    2:22 PM 05/25/2023    3:48 PM 04/27/2023    5:17 PM  Depression screen PHQ 2/9  Decreased Interest 3 3 3 3 3   Down, Depressed, Hopeless 3 3 3 3 3   PHQ - 2 Score 6 6 6 6 6   Altered sleeping 3 3 3 3 3   Tired, decreased energy 3 3 3 3 3   Change in appetite 3 3 3 3 3   Feeling bad or failure about yourself  3 3 3 3 3   Trouble concentrating 3 3 3 3 3   Moving slowly or fidgety/restless 3 3 3 3 3   Suicidal thoughts 0 0 0 0 3  PHQ-9 Score 24 24 24 24 27   Difficult doing work/chores Extremely dIfficult Extremely dIfficult Very difficult Extremely dIfficult Extremely dIfficult        10/05/2023    9:49 AM 09/14/2023   10:02 AM 05/25/2023    3:48 PM 04/27/2023    5:18 PM  GAD 7 : Generalized Anxiety Score  Nervous, Anxious, on Edge 3 3 3 3   Control/stop worrying 3 3 3 3   Worry too much - different things 3 3 3 3   Trouble relaxing 3 3 3 3   Restless 3 3 3 3   Easily annoyed or irritable 0 3 3 3   Afraid - awful might happen 0 3 3  3   Total GAD 7 Score 15 21 21 21   Anxiety Difficulty Somewhat difficult Extremely difficult Extremely difficult Extremely difficult     Pertinent History Reviewed:   Reviewed past medical,surgical, social, obstetrical and family history.  Reviewed problem list, medications and allergies. Objective Findings & Procedure:    Vitals:   10/06/23 1535  BP: 108/62  Pulse: 78  Weight: 228 lb (103.4 kg)  Height: 5\' 9"  (1.753 m)  Body mass index is 33.67 kg/m.  No results found for this or any previous visit (from the past 24 hour(s)).   Time out was performed.  Nexplanon site identified.  Area prepped in usual sterile fashon. One cc of 2% lidocaine was used to anesthetize the area at the distal end of the implant. A small stab incision was made right beside the implant on the distal portion.  The Nexplanon rod was grasped using hemostats and removed without difficulty.  There was less than 3 cc blood loss. There were no complications.  Steri-strips were applied over the small incision and a pressure bandage was applied.  The patient tolerated the procedure well. Assessment & Plan:  1) Nexplanon removal She was instructed to keep the area clean and dry, remove pressure bandage in 24 hours, and keep insertion site covered with the steri-strip for 3-5 days.   Follow-up PRN problems.  No orders of the defined types were placed in this encounter.   Follow-up: Return in about 4 weeks (around 11/03/2023) for IUD f/u, in person.  Cheral Marker CNM, [facility] 10/06/2023 4:24 PM     IUD INSERTION Patient name: Jaime [name] MRN [id number]  Date of birth: [birth year] Subjective Findings:   Jaime [name] is a 37 y.o. G0P0000 Caucasian female being seen today for insertion of a Paragard IUD.  No LMP recorded (lmp unknown). (Menstrual status: IUD). Last sexual intercourse was >2wks ago Last pap7/8/21. Results were: NILM w/ HRHPV negative  The risks and benefits of the method and placement  have been thouroughly reviewed with the patient and all questions were answered.  Specifically the patient is aware of failure rate of 11/998, expulsion of the IUD and of possible perforation.  The patient is aware of irregular bleeding due to the method and understands the incidence of irregular bleeding diminishes with time.  Signed copy of informed consent in chart.      10/05/2023    9:49 AM 09/14/2023   10:02 AM 07/02/2023    2:22 PM 05/25/2023    3:48 PM 04/27/2023    5:17 PM  Depression screen PHQ 2/9  Decreased Interest 3 3 3 3 3   Down, Depressed, Hopeless 3 3 3 3 3   PHQ - 2 Score 6 6 6 6 6   Altered sleeping 3 3 3 3 3   Tired, decreased energy 3 3 3 3 3   Change in appetite 3 3 3 3 3   Feeling bad or failure about yourself  3 3 3 3 3   Trouble concentrating 3 3 3 3 3   Moving slowly or fidgety/restless 3 3 3 3 3   Suicidal thoughts 0 0 0 0 3  PHQ-9 Score 24 24 24 24 27   Difficult doing work/chores Extremely dIfficult Extremely dIfficult Very difficult Extremely dIfficult Extremely dIfficult        10/05/2023    9:49 AM 09/14/2023   10:02 AM 05/25/2023    3:48 PM 04/27/2023    5:18 PM  GAD 7 : Generalized Anxiety Score  Nervous, Anxious, on Edge 3 3 3 3   Control/stop worrying 3 3 3 3   Worry too much - different things 3 3 3 3   Trouble relaxing 3 3 3 3   Restless 3 3 3 3   Easily annoyed or irritable 0 3 3 3   Afraid - awful might happen 0 3 3 3   Total GAD 7 Score 15 21 21 21   Anxiety Difficulty Somewhat difficult Extremely difficult Extremely difficult Extremely difficult     Pertinent History Reviewed:   Reviewed past medical,surgical, social, obstetrical and family history.  Reviewed problem list, medications and allergies. Objective Findings & Procedure:   Vitals:   10/06/23 1535  BP: 108/62  Pulse: 78  Weight: 228 lb (103.4 kg)  Height: 5\' 9"  (1.753 m)  Body mass index is 33.67 kg/m.  No results found for this or any previous visit (from the past 24 hour(s)).    Time out was performed.  A graves speculum was placed in the vagina.  The cervix was visualized, thin prep pap obtained, cervix then prepped using Betadine, and grasped with a single tooth tenaculum. The uterus was found to be neutral and it sounded to 7  cm.  Paragard  IUD placed per manufacturer's recommendations. The strings were trimmed to approximately 3 cm. The patient tolerated the procedure well.   Informal transvaginal sonogram was performed and the proper placement of the IUD was verified.  Chaperone: Administrator, sports & Plan:   1) Paragard IUD insertion The patient was given post procedure instructions, including signs and symptoms of infection and to check for the strings after each menses or each month, and refraining from intercourse or anything in the vagina for 3 days. She was given a care card with date IUD placed, and date IUD to be removed. She is scheduled for a f/u appointment in 4 weeks.  2) Cervical cancer screening> pap today  No orders of the defined types were placed in this encounter.   Return in about 4 weeks (around 11/03/2023) for IUD f/u, in person.  Cheral Marker CNM, [facility] 10/06/2023 4:24 PM

## 2023-10-08 LAB — CYTOLOGY - PAP
Adequacy: ABSENT
Comment: NEGATIVE
Diagnosis: NEGATIVE
High risk HPV: NEGATIVE

## 2023-10-11 ENCOUNTER — Ambulatory Visit (INDEPENDENT_AMBULATORY_CARE_PROVIDER_SITE_OTHER): Payer: MEDICAID | Admitting: Gastroenterology

## 2023-10-13 ENCOUNTER — Telehealth: Payer: Self-pay | Admitting: Family Medicine

## 2023-10-13 ENCOUNTER — Other Ambulatory Visit: Payer: Self-pay | Admitting: Family Medicine

## 2023-10-13 NOTE — Telephone Encounter (Signed)
This patient really needs a psychiatrist, is there any  where else we can refer her too that is willing to take her. Sent multiple psych referrals for the past few months and no luck with getting into psych

## 2023-10-13 NOTE — Telephone Encounter (Signed)
Copied from CRM [id number]. Topic: Clinical - Medical Advice >> Oct 08, 2023  2:08 PM Joanette Gula wrote: Reason for CRM: April w/ [facility] Ph: [phone] ext. [phone].   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 >> Oct 11, 2023  6:46 AM CMA Crystal B wrote: Not our pt

## 2023-10-15 ENCOUNTER — Other Ambulatory Visit: Payer: Self-pay | Admitting: Family Medicine

## 2023-10-15 DIAGNOSIS — G4700 Insomnia, unspecified: Secondary | ICD-10-CM

## 2023-10-18 ENCOUNTER — Other Ambulatory Visit (HOSPITAL_COMMUNITY): Payer: Self-pay

## 2023-10-21 ENCOUNTER — Ambulatory Visit (INDEPENDENT_AMBULATORY_CARE_PROVIDER_SITE_OTHER): Payer: MEDICAID | Admitting: Gastroenterology

## 2023-10-21 VITALS — BP 109/63 | HR 93 | Temp 98.0°F | Ht 69.0 in | Wt 235.5 lb

## 2023-10-21 DIAGNOSIS — D509 Iron deficiency anemia, unspecified: Secondary | ICD-10-CM

## 2023-10-21 DIAGNOSIS — R112 Nausea with vomiting, unspecified: Secondary | ICD-10-CM

## 2023-10-21 DIAGNOSIS — K7460 Unspecified cirrhosis of liver: Secondary | ICD-10-CM | POA: Diagnosis not present

## 2023-10-21 DIAGNOSIS — R1013 Epigastric pain: Secondary | ICD-10-CM

## 2023-10-21 DIAGNOSIS — R1010 Upper abdominal pain, unspecified: Secondary | ICD-10-CM

## 2023-10-21 DIAGNOSIS — R1084 Generalized abdominal pain: Secondary | ICD-10-CM

## 2023-10-21 DIAGNOSIS — K2970 Gastritis, unspecified, without bleeding: Secondary | ICD-10-CM

## 2023-10-21 NOTE — Patient Instructions (Addendum)
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

## 2023-10-21 NOTE — Progress Notes (Signed)
Referring Provider: Wylene Men* Primary Care Physician:  Rica Records, FNP Primary GI Physician: Dr. Levon Hedger   Chief Complaint  Patient presents with   Gastroesophageal Reflux    Follow up on GERD. States meds are working well.    Abscess    Has concerns about abscess on buttuck. Came up about one week ago. Painful. Some drainage.    HPI:   FRANSHESCA TEPPER is a 37 y.o. 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  Having some coffee-ground emesis.  Taking pantoprazole twice daily.  Had some improvement previously with Carafate.  Continues to have intermittent rectal bleeding.  Usually 1 BM per day.  Seeing cancer center for DVT and thrombocytopenia.  Currently on Lovenox plans to repeat Doppler in July and possibly DC Lovenox if blood clot resolved at that time.  Patient recommended to schedule colonoscopy, EGD, update AFP tumor marker, update ultrasound of the liver, stop Protonix and start omeprazole, Carafate 1 g 4 times daily, Zofran 4 mg every 8 hours as needed for nausea, continue Lasix 40 and spironolactone 100 mg daily  Admission to [facility] in November with PE, also with findings of cirrhosis with upper abdominal ascites, mild wall thickening of distal esophagus may be due to venous congestion, esophagitis, paraesophageal varices.   DVT in November with continued LLE DVT from L common femoral vein to popliteal vein, DVT occlusive.   Last hgb 12 on 10/05/23   Present:  States she is now on eliquis, was on lovenox when she developed PE in November. Still having some epigastric pain and nausea and some vomiting. Appetite is  not great recently. She is having some diarrhea. She is having about 3-4 watery BMs per day. This started about 1 week ago. No rectal bleeding. Denies any melena. She feels she has had some swelling in her abdomen and maybe some in her legs. She is taking omeprazole 40mg  BID and famotidine 20mg  at bedtime. She is currently on torsemide 40mg  daily on, appears she was taken off of spironolactone at some point but she does not know when or by whom. Denies issues with dysphagia. She thinks carafate she was previously prescribed helped some with her pain but did not resolve it. Eating tends to make her pain worse. Denies episodes of confusion, jaundice or pruritus.    Previous MELD 3.0: 9 -nov 2024 Cirrhosis related questions: Episodes of confusion/disorientation: no  Taking diuretics? Torsemide 40mg  daily  Beta blockers? Propranolol 10mg  BID (not for EVs) Prior history of variceal banding? No  Prior episodes of SBP? No  Last liver imaging: last dedicated liver imaging 08/2021 Alcohol use: no   Last Colonoscopy: 03/2022 - Preparation of the colon was poor.                           - Hemorrhoids found on perianal exam.                           - Stool in the rectum, in the sigmoid colon, in the  descending colon and in the transverse colon.                           - No specimens collected (repeat at age 73) Last EGD: 01/2023 [facility] esophageal plaques concerning for candida. Biopsied  - Nodular gastritis. Biopsied  (Mild chronic inactive gastritis with focal glandular epithelial reactive changes, No intestinal metaplasia, dysplasia or malignancy is seen. Microorganisms morphologically consistent with H pylori are not identified, no fungal infection seen on esophageal biopsy)   Past Medical History:  Diagnosis Date   Acute encephalopathy 08/31/2020   Adjustment disorder with depressed mood 09/07/2020   Anxiety    Asthma    Chronic abdominal pain    Chronic back  pain    Chronic prescription opiate use 02/16/2022   Cirrhosis (HCC)    Cirrhosis (HCC)    COPD (chronic obstructive pulmonary disease) (HCC)    Depression    Diabetes mellitus without complication (HCC)    Diabetes mellitus, type II (HCC)    Diverticulosis    DVT (deep venous thrombosis) (HCC) 02/04/2023   taking lovenox prior to procedure   Fatty liver 03/07/2020   GERD (gastroesophageal reflux disease)    Hepatitis C    Hernia, abdominal    Hyperglycemia due to type 2 diabetes mellitus (HCC) 05/24/2020   Hyperlipidemia    IBS (irritable bowel syndrome) 02/16/2022   Insomnia    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)    Lupus    Migraine headache    Morbid obesity (HCC) 05/24/2020   Neuropathy    Nocturnal seizures (HCC)    Pain of upper abdomen 07/28/2021   Peptic ulcer    Spleen enlarged    Splenomegaly     Past Surgical History:  Procedure Laterality Date   BIOPSY  11/25/2021   Procedure: BIOPSY;  Surgeon: Dolores Frame, MD;  [facility];  Service: Gastroenterology;;   CHOLECYSTECTOMY     COLONOSCOPY WITH PROPOFOL N/A 11/25/2021   Procedure: COLONOSCOPY WITH PROPOFOL;  Surgeon: Dolores Frame, MD;  Location: [facility];  Service: Gastroenterology;  Laterality: N/A;  805   COLONOSCOPY WITH PROPOFOL N/A 03/31/2022   Procedure: COLONOSCOPY WITH PROPOFOL;  Surgeon: Dolores Frame, MD;  Location: [facility];  Service: Gastroenterology;  Laterality: N/A;  730   ESOPHAGOGASTRODUODENOSCOPY  06/2020   done at [facility], candida in upper esophagus (treated with diflucan), ulcerative esophagitis at GE junction, gastritis in stomach, single ulcer in duodenal bulb, with duodenal mucosa showing no abnormality. No presence of varices   ESOPHAGOGASTRODUODENOSCOPY (EGD) WITH PROPOFOL N/A 11/25/2021   Procedure: ESOPHAGOGASTRODUODENOSCOPY (EGD) WITH PROPOFOL;  Surgeon: Dolores Frame, MD;  [facility];  Service: Gastroenterology;  Laterality: N/A;   ESOPHAGOGASTRODUODENOSCOPY (EGD) WITH PROPOFOL N/A 03/31/2022   Procedure: ESOPHAGOGASTRODUODENOSCOPY (EGD) WITH PROPOFOL;  Surgeon: Dolores Frame, MD;  [facility]: [facility];  Service: Gastroenterology;  Laterality: N/A;   IVC FILTER REMOVAL N/A 09/16/2023   Procedure: IVC FILTER REMOVAL;  Surgeon: [name], MD;  Location: [facility] INVASIVE CV LAB;  Service: Cardiovascular;  Laterality: N/A;   PERIPHERAL VASCULAR THROMBECTOMY Left 09/16/2023   Procedure: PERIPHERAL VASCULAR THROMBECTOMY;  Surgeon: [name], MD;  Location: [facility] INVASIVE CV LAB;  Service: Cardiovascular;  Laterality: Left;    Current Outpatient Medications  Medication Sig Dispense Refill   acetaminophen (TYLENOL) 500 MG tablet Take 1,000 mg by mouth 2 (two) times  daily as needed for moderate pain (pain score 4-6) or headache.     albuterol (VENTOLIN HFA) 108 (90 Base) MCG/ACT inhaler Inhale 2 puffs into the lungs every 6 (six) hours as needed for wheezing or shortness of breath. 8 g 0   apixaban (ELIQUIS) 5 MG TABS tablet Take 2 tablets (10 mg total) by mouth 2 (two) times daily for 6 days, THEN 1 tablet (5 mg total) 2 (two) times daily. 60 tablet 4   ARIPiprazole (ABILIFY) 20 MG tablet Take 1 tablet (20 mg total) by mouth daily. 30 tablet 2   clonazePAM (KLONOPIN) 0.5 MG tablet Take 0.5 mg by mouth 2 (two) times daily as needed for anxiety.     Continuous Glucose Transmitter (DEXCOM G6 TRANSMITTER) MISC USE AS DIRECTED. 1 each 1   famotidine (PEPCID) 20 MG tablet Take 1 tablet (20 mg total) by mouth at bedtime.     ferrous sulfate 325 (65 FE) MG EC tablet Take 1 tablet (325 mg total) by mouth daily with breakfast. 90 tablet 3   glipiZIDE (GLUCOTROL XL) 5 MG 24 hr tablet take 1 tablet by mouth daily with breakfast. 30 tablet 2   glucose blood (ACCU-CHEK GUIDE) test strip Use as instructed to check blood glucose four times daily 150 each 5   hydrOXYzine  (VISTARIL) 50 MG capsule Take 1 capsule (50 mg total) by mouth 3 (three) times daily as needed. 30 capsule 2   hyoscyamine (ANASPAZ) 0.125 MG TBDP disintergrating tablet TAKE 1 TABLET UNDER THE TONGUE THREE TIMES A DAY AS NEEDED FOR BLADDER SPASMS OR CRAMPING. 90 tablet 0   Insulin Pen Needle (PEN NEEDLES) 32G X 6 MM MISC 1 each by Does not apply route 3 (three) times daily. 100 each 3   insulin regular human CONCENTRATED (HUMULIN R U-500 KWIKPEN) 500 UNIT/ML KwikPen Inject 90 Units into the skin 3 (three) times daily with meals. (Patient taking differently: Inject 100 Units into the skin 2 (two) times daily with a meal. Only if BG > 90) 45 mL 3   lidocaine (XYLOCAINE) 5 % ointment Apply 1 Application topically as needed. (Patient taking differently: Apply 1 Application topically 2 (two) times daily as needed (pain).) 35.44 g 5   lisinopril (ZESTRIL) 5 MG tablet Take 5 mg by mouth daily.     metFORMIN (GLUCOPHAGE) 1000 MG tablet take 1 tablet by mouth 2 times a day with a meal. (Patient taking differently: Take 1,000 mg by mouth 2 (two) times daily with a meal.) 60 tablet 2   methadone (DOLOPHINE) 10 MG/ML solution Take 65 mg by mouth daily.     methocarbamol (ROBAXIN) 750 MG tablet Take 750 mg by mouth every 6 (six) hours as needed for muscle spasms.     nicotine (NICODERM CQ - DOSED IN MG/24 HOURS) 21 mg/24hr patch Place 1 patch (21 mg total) onto the skin daily. 28 patch 0   omeprazole (PRILOSEC) 40 MG capsule TAKE 1 CAPSULE BY MOUTH TWICE DAILY. 120 capsule 5   ondansetron (ZOFRAN) 4 MG tablet Take 1 tablet (4 mg total) by mouth every 8 (eight) hours as needed for nausea or vomiting. 20 tablet 1   Oxycodone HCl 10 MG TABS Take 1 tablet (10 mg total) by mouth every 6 (six) hours as needed (pain). 30 tablet 0   potassium chloride (KLOR-CON) 10 MEQ tablet Take 10 mEq by mouth 2 (two) times daily.     propranolol (INDERAL) 10 MG tablet Take 1 tablet (10 mg total) by mouth  2 (two) times daily.      QUEtiapine (SEROQUEL) 50 MG tablet Take 1 tablet (50 mg total) by mouth at bedtime. 30 tablet 2   tirzepatide (MOUNJARO) 5 MG/0.5ML Pen Inject 5 mg into the skin every Monday.     torsemide (DEMADEX) 20 MG tablet Take 2 tablets (40 mg total) by mouth daily.     venlafaxine XR (EFFEXOR XR) 150 MG 24 hr capsule Take 1 capsule (150 mg total) by mouth daily with breakfast. 30 capsule 3   Current Facility-Administered Medications  Medication Dose Route Frequency Provider Last Rate Last Admin   etonogestrel (NEXPLANON) implant 68 mg  68 mg Subdermal Once Hermina Staggers, MD        Allergies as of 10/21/2023 - Review Complete 10/21/2023  Allergen Reaction Noted   Firvanq [vancomycin] Swelling 05/15/2018   Iodine-131 Anaphylaxis, Shortness Of Breath, and Swelling 05/01/2018   Ivp dye [iodinated contrast media] Anaphylaxis 10/07/2013   Toradol [ketorolac tromethamine] Shortness Of Breath 05/15/2018   Tylenol [acetaminophen] Other (See Comments) 01/23/2019   Neurontin [gabapentin] Itching 05/28/2020   Nsaids Other (See Comments) 12/22/2010   Suboxone [buprenorphine hcl-naloxone hcl] Other (See Comments) 05/28/2020    Family History  Problem Relation Age of Onset   Hyperlipidemia Maternal Grandfather     Social History   Socioeconomic History   Marital status: Married    Spouse name: Not on file   Number of children: Not on file   Years of education: Not on file   Highest education level: Not on file  Occupational History   Not on file  Tobacco Use   Smoking status: Former    Current packs/day: 0.50    Types: Cigarettes    Passive exposure: Current   Smokeless tobacco: Never  Vaping Use   Vaping status: Former   Substances: Nicotine, Flavoring  Substance and Sexual Activity   Alcohol use: No   Drug use: No   Sexual activity: Not Currently    Birth control/protection: Implant  Other Topics Concern   Not on file  Social History Narrative   Not on file   Social Drivers of  Health   Financial Resource Strain: Medium Risk (05/09/2020)   Overall Financial Resource Strain (CARDIA)    Difficulty of Paying Living Expenses: Somewhat hard  Food Insecurity: No Food Insecurity (09/25/2023)   Hunger Vital Sign    Worried About Running Out of Food in the Last Year: Never true    Ran Out of Food in the Last Year: Never true  Transportation Needs: No Transportation Needs (09/25/2023)   PRAPARE - Administrator, Civil Service (Medical): No    Lack of Transportation (Non-Medical): No  Physical Activity: Insufficiently Active (05/09/2020)   Exercise Vital Sign    Days of Exercise per Week: 1 day    Minutes of Exercise per Session: 20 min  Stress: Stress Concern Present (05/09/2020)   [employer] of Occupational Health - Occupational Stress Questionnaire    Feeling of Stress : Very much  Social Connections: Unknown (03/04/2022)   Received from [facility]   Social Network    Social Network: Not on file    Review of systems General: negative for malaise, night sweats, fever, chills, weight loss Neck: Negative for lumps, goiter, pain and significant neck swelling Resp: Negative for cough, wheezing, dyspnea at rest CV: Negative for chest pain, leg swelling, palpitations, orthopnea GI: denies melena, hematochezia, diarrhea, constipation, dysphagia, odyonophagia, early satiety or unintentional weight loss. +  nausea +vomiting +abdominal pain  MSK: Negative for joint pain or swelling, back pain, and muscle pain. Derm: Negative for itching or rash Psych: Denies depression, anxiety, memory loss, confusion. No homicidal or suicidal ideation.  Heme: Negative for prolonged bleeding, bruising easily, and swollen nodes. Endocrine: Negative for cold or heat intolerance, polyuria, polydipsia and goiter. Neuro: negative for tremor, gait imbalance, syncope and seizures. The remainder of the review of systems is noncontributory.  Physical Exam: BP 109/63  (BP Location: Right Arm, Patient Position: Sitting, Cuff Size: Normal)   Pulse 93   Temp 98 F (36.7 C) (Oral)   Ht 5\' 9"  (1.753 m)   Wt 235 lb 8 oz (106.8 kg)   BMI 34.78 kg/m  General:   Alert and oriented. No distress noted. Pleasant and cooperative.  Head:  Normocephalic and atraumatic. Eyes:  Conjuctiva clear without scleral icterus. Mouth:  Oral mucosa pink and moist. Good dentition. No lesions. Heart: Normal rate and rhythm, s1 and s2 heart sounds present.  Lungs: Clear lung sounds in all lobes. Respirations equal and unlabored. Abdomen:  +BS, soft, non-tender and non-distended. No rebound or guarding. No HSM or masses noted. Derm: No palmar erythema or jaundice, small erythematous lesion to R sacral area, no drainage  Msk:  Symmetrical without gross deformities. Normal posture. Extremities:  Without edema. Neurologic:  Alert and  oriented x4 Psych:  Alert and cooperative. Normal mood and affect.  Invalid input(s): "6 MONTHS"   ASSESSMENT: BIRDENA CASANAS is a 37 y.o. female presenting today for follow up of cirrhosis, nausea/vomiting and abdominal pain.  Cirrhosis: appears mostly well compensated. Recent EGD without EVs. She has not presented with any signs of HE. She is currently on torsemide 40mg , previously on lasix 40mg /spironolactone 100mg , unclear when or why this was changed, though given no gross ascites on exam today, will not make any changes to her diuretics at this time. MELD 3.0 in November was 9 though she is well overdue for [facility] screening via [place], we will update this and AFP tumor marker.  Abdominal pain/nausea/vomiting: ongoing since earlier this year. EGD at [facility] in march without EVs, concern for candida esophagitis which she was given fluconazole for, however, biopsy was not consistent with this. No H pylori. She did have nodular gastritis. Notes continuation of symptoms. Eating makes her epigastric pain worse. Currently on omeprazole 40mg  BID and famotidine  20mg  at bedtime, ideally she would benefit from an EGD for further evaluation, though given her recent blood clots and requirement for ongoing ACs, will obtain CT A/P at this time to try and evaluate her symptoms further. May proceed with EGD at a later date once ACs can be safely held.   IDA: previously recommended to undergo EGD/Colonoscopy though procedures were put on hold due to her ongoing blood clots. She is currently not having rectal bleeding or melena, last hgb was 12.   Sacral lesion: patient has small erythematous lesion to her right sacral area. No obvious drainage, recommended she follow up with PCP regarding this.   PLAN:  -RUQ [place] for HCC screening -continue omeprazole 40mg  twice daily and famotidine 20mg  in the evenings -Continue torsemide 40mg  for now -CT A/P with contrast  -AFP tumor marker - Reduce salt intake to <2 g per day - Can take Tylenol max of 2 g per day (650 mg q8h) for pain - Avoid NSAIDs for pain - Avoid eating raw oysters/shellfish  - Ensure every night before going to sleep  All questions were answered,  patient verbalized understanding and is in agreement with plan as outlined above.    Follow Up: 2 months   Ayan Yankey L. Jeanmarie Hubert, MSN, APRN, AGNP-C Adult-Gerontology Nurse Practitioner [facility]

## 2023-10-21 NOTE — Telephone Encounter (Signed)
  [facility] in [place] has 24-7 walk in.  She just needs to go, no referral needed per [facility]

## 2023-10-21 NOTE — Telephone Encounter (Signed)
Thank you :)

## 2023-10-22 ENCOUNTER — Telehealth (INDEPENDENT_AMBULATORY_CARE_PROVIDER_SITE_OTHER): Payer: Self-pay | Admitting: Gastroenterology

## 2023-10-22 ENCOUNTER — Encounter: Payer: Self-pay | Admitting: Family Medicine

## 2023-10-22 ENCOUNTER — Ambulatory Visit (INDEPENDENT_AMBULATORY_CARE_PROVIDER_SITE_OTHER): Payer: MEDICAID | Admitting: Family Medicine

## 2023-10-22 VITALS — BP 103/68 | HR 94 | Ht 69.0 in | Wt 235.0 lb

## 2023-10-22 DIAGNOSIS — E1169 Type 2 diabetes mellitus with other specified complication: Secondary | ICD-10-CM | POA: Insufficient documentation

## 2023-10-22 DIAGNOSIS — L03317 Cellulitis of buttock: Secondary | ICD-10-CM | POA: Diagnosis not present

## 2023-10-22 DIAGNOSIS — Z794 Long term (current) use of insulin: Secondary | ICD-10-CM

## 2023-10-22 DIAGNOSIS — L0231 Cutaneous abscess of buttock: Secondary | ICD-10-CM | POA: Diagnosis not present

## 2023-10-22 LAB — AFP TUMOR MARKER: AFP-Tumor Marker: 2.4 ng/mL

## 2023-10-22 MED ORDER — DOXYCYCLINE HYCLATE 100 MG PO TABS: 100.0000 mg | ORAL_TABLET | Freq: Two times a day (BID) | ORAL | 0 refills | Status: DC

## 2023-10-22 MED ORDER — NYSTATIN 100000 UNIT/GM EX CREA: 1.0000 | TOPICAL_CREAM | Freq: Two times a day (BID) | CUTANEOUS | 0 refills | Status: DC

## 2023-10-22 MED ORDER — DOXYCYCLINE HYCLATE 100 MG PO TABS: 100.0000 mg | ORAL_TABLET | Freq: Two times a day (BID) | ORAL | 0 refills | Status: AC

## 2023-10-22 NOTE — Assessment & Plan Note (Addendum)
Diabetes associated with obesity and depression Uncontrolled , managed by Endo, needs f/u with PCP also  Jaime [name] is reminded of the importance of commitment to daily physical activity for 30 minutes or more, as able and the need to limit carbohydrate intake to 30 to 60 grams per meal to help with blood sugar control.   The need to take medication as prescribed, test blood sugar as directed, and to call between visits if there is a concern that blood sugar is uncontrolled is also discussed.   Jaime [name] is reminded of the importance of daily foot exam, annual eye examination, and good blood sugar, blood pressure and cholesterol control.     Latest Ref Rng & Units 10/05/2023   10:43 AM 09/28/2023    4:38 AM 09/27/2023    7:32 AM 09/26/2023    3:44 AM 09/25/2023    9:13 PM  Diabetic Labs  Creatinine 0.57 - 1.00 mg/dL 1.61  0.96  0.45  4.09  0.46       10/22/2023    8:08 AM 10/21/2023    2:40 PM 10/06/2023    3:35 PM 10/05/2023    9:10 AM 09/28/2023    2:24 PM 09/28/2023   10:35 AM 09/28/2023    8:06 AM  BP/Weight  Systolic BP 103 109 108 97 116 110 124  Diastolic BP 68 63 62 66 71 64 72  Wt. (Lbs) 235 235.5 228 231.04     BMI 34.7 kg/m2 34.78 kg/m2 33.67 kg/m2 34.12 kg/m2         Latest Ref Rng & Units 07/14/2023   12:00 AM 01/08/2023    1:20 PM  Foot/eye exam completion dates  Eye Exam No Retinopathy No Retinopathy       Foot Form Completion   Done     This result is from an external source.

## 2023-10-22 NOTE — Assessment & Plan Note (Signed)
Doxycycline x 10 days prescribed with f/u

## 2023-10-22 NOTE — Assessment & Plan Note (Signed)
  Patient re-educated about  the importance of commitment to a  minimum of 150 minutes of exercise per week as able.  The importance of healthy food choices with portion control discussed, as well as eating regularly and within a 12 hour window most days. The need to choose "clean , green" food 50 to 75% of the time is discussed, as well as to make water the primary drink and set a goal of 64 ounces water daily.       10/22/2023    8:08 AM 10/21/2023    2:40 PM 10/06/2023    3:35 PM  Weight /BMI  Weight 235 lb 235 lb 8 oz 228 lb  Height 5\' 9"  (1.753 m) 5\' 9"  (1.753 m) 5\' 9"  (1.753 m)  BMI 34.7 kg/m2 34.78 kg/m2 33.67 kg/m2

## 2023-10-22 NOTE — Telephone Encounter (Signed)
Pt CT scheduled for 11/26/23 at 2pm. Pt needs to arrive at 12pm to [facility]. NPO 4 hours prior Pt has allergy to contrast media and per central scheduling pt will need 13 hour prep and prednisone prior to CT. Please advise. Thank you

## 2023-10-22 NOTE — Progress Notes (Signed)
Jaime [name]     MRN: [id number]      DOB: August 24, 1986  Chief Complaint  Patient presents with   Follow-up    Sore on bottom noticed one week ago open and draining     HPI Jaime [name] is here with a 1 week history of a boil draining from her right buttock, she is unclear how she got this.  She denies ever having this problem before and is an uncontrolled diabetic.  She denies fever chills or malaise.   ROS Denies recent fever or chills. Denies sinus pressure, nasal congestion, ear pain or sore throat. Denies chest congestion, productive cough or wheezing. . Denies uncontrolled depression, anxiety or insomnia. Denies skin break down or rash.   PE  BP 103/68 (BP Location: Right Arm, Patient Position: Sitting, Cuff Size: Large)   Pulse 94   Ht 5\' 9"  (1.753 m)   Wt 235 lb (106.6 kg)   SpO2 92%   BMI 34.70 kg/m   Patient alert and oriented and in no cardiopulmonary distress.  HEENT: No facial asymmetry, EOMI,     Neck supple .  Chest: Clear to auscultation bilaterally.  CVS: S1, S2 no murmurs, no S3.Regular rate.  .   Ext: No edema  MS: Adequate ROM spine, shoulders, hips and knees.  Skin: Iopen ulcer on right buttock, max diameter approx 2 cm, draining, tender, surrounding erythema Psych: Good eye contact, normal affect. Memory intact not anxious or depressed appearing.  CNS: CN 2-12 intact, power,  normal throughout.no focal deficits noted.   Assessment & Plan  Cellulitis and abscess of buttock Doxycycline x 10 days prescribed with f/u  Type 2 diabetes mellitus with other specified complication (HCC) Diabetes associated with obesity and depression Uncontrolled , managed by Endo, needs f/u with PCP also  Jaime [name] is reminded of the importance of commitment to daily physical activity for 30 minutes or more, as able and the need to limit carbohydrate intake to 30 to 60 grams per meal to help with blood sugar control.   The need to take medication as  prescribed, test blood sugar as directed, and to call between visits if there is a concern that blood sugar is uncontrolled is also discussed.   Jaime [name] is reminded of the importance of daily foot exam, annual eye examination, and good blood sugar, blood pressure and cholesterol control.     Latest Ref Rng & Units 10/05/2023   10:43 AM 09/28/2023    4:38 AM 09/27/2023    7:32 AM 09/26/2023    3:44 AM 09/25/2023    9:13 PM  Diabetic Labs  Creatinine 0.57 - 1.00 mg/dL 6.23  7.62  8.31  5.17  0.46       10/22/2023    8:08 AM 10/21/2023    2:40 PM 10/06/2023    3:35 PM 10/05/2023    9:10 AM 09/28/2023    2:24 PM 09/28/2023   10:35 AM 09/28/2023    8:06 AM  BP/Weight  Systolic BP 103 109 108 97 116 110 124  Diastolic BP 68 63 62 66 71 64 72  Wt. (Lbs) 235 235.5 228 231.04     BMI 34.7 kg/m2 34.78 kg/m2 33.67 kg/m2 34.12 kg/m2         Latest Ref Rng & Units 07/14/2023   12:00 AM 01/08/2023    1:20 PM  Foot/eye exam completion dates  Eye Exam No Retinopathy No Retinopathy       Foot Form Completion  Done     This result is from an external source.        Morbid obesity (HCC)  Patient re-educated about  the importance of commitment to a  minimum of 150 minutes of exercise per week as able.  The importance of healthy food choices with portion control discussed, as well as eating regularly and within a 12 hour window most days. The need to choose "clean , green" food 50 to 75% of the time is discussed, as well as to make water the primary drink and set a goal of 64 ounces water daily.       10/22/2023    8:08 AM 10/21/2023    2:40 PM 10/06/2023    3:35 PM  Weight /BMI  Weight 235 lb 235 lb 8 oz 228 lb  Height 5\' 9"  (1.753 m) 5\' 9"  (1.753 m) 5\' 9"  (1.753 m)  BMI 34.7 kg/m2 34.78 kg/m2 33.67 kg/m2

## 2023-10-22 NOTE — Patient Instructions (Addendum)
Follow-up with PCP for reevaluation in 2 to 4 weeks, call if you need to be seen sooner.  10-day course of antibiotic and also vaginal cream in the event that you get a yeast infection with antibiotic is prescribed.   Thanks for choosing [facility], we consider it a privelige to serve you.

## 2023-11-02 ENCOUNTER — Ambulatory Visit: Payer: MEDICAID | Admitting: Women's Health

## 2023-11-04 ENCOUNTER — Encounter: Payer: Self-pay | Admitting: Nurse Practitioner

## 2023-11-04 ENCOUNTER — Ambulatory Visit (INDEPENDENT_AMBULATORY_CARE_PROVIDER_SITE_OTHER): Payer: MEDICAID | Admitting: Nurse Practitioner

## 2023-11-04 VITALS — BP 98/60 | HR 88 | Ht 69.0 in | Wt 223.0 lb

## 2023-11-04 DIAGNOSIS — Z7984 Long term (current) use of oral hypoglycemic drugs: Secondary | ICD-10-CM

## 2023-11-04 DIAGNOSIS — E1165 Type 2 diabetes mellitus with hyperglycemia: Secondary | ICD-10-CM

## 2023-11-04 DIAGNOSIS — E782 Mixed hyperlipidemia: Secondary | ICD-10-CM

## 2023-11-04 DIAGNOSIS — Z7985 Long-term (current) use of injectable non-insulin antidiabetic drugs: Secondary | ICD-10-CM | POA: Diagnosis not present

## 2023-11-04 DIAGNOSIS — Z794 Long term (current) use of insulin: Secondary | ICD-10-CM | POA: Diagnosis not present

## 2023-11-04 LAB — POCT GLYCOSYLATED HEMOGLOBIN (HGB A1C): Hemoglobin A1C: 7.8 % — AB (ref 4.0–5.6)

## 2023-11-04 MED ORDER — METFORMIN HCL 1000 MG PO TABS: 1000.0000 mg | ORAL_TABLET | Freq: Two times a day (BID) | ORAL | 3 refills | Status: DC

## 2023-11-04 MED ORDER — TIRZEPATIDE 7.5 MG/0.5ML ~~LOC~~ SOAJ: 7.5000 mg | SUBCUTANEOUS | 1 refills | Status: DC

## 2023-11-04 MED ORDER — DEXCOM G7 SENSOR MISC: 1.0000 | 3 refills | Status: DC

## 2023-11-04 MED ORDER — HUMULIN R U-500 KWIKPEN 500 UNIT/ML ~~LOC~~ SOPN: 80.0000 [IU] | PEN_INJECTOR | Freq: Three times a day (TID) | SUBCUTANEOUS | 3 refills | Status: DC

## 2023-11-04 NOTE — Progress Notes (Signed)
 11/04/2023, 9:35 AM     Endocrinology Follow Up Visit  Subjective:    Patient ID: Jaime [name], female    DOB: 02-Nov-1986.  Jaime [name] is being seen in follow up for management of currently uncontrolled symptomatic diabetes requested by  Terry Wilhelmena Lloyd Hilario, FNP.   Past Medical History:  Diagnosis Date   Acute encephalopathy 08/31/2020   Adjustment disorder with depressed mood 09/07/2020   Anxiety    Asthma    Chronic abdominal pain    Chronic back pain    Chronic prescription opiate use 02/16/2022   Cirrhosis (HCC)    Cirrhosis (HCC)    COPD (chronic obstructive pulmonary disease) (HCC)    Depression    Diabetes mellitus without complication (HCC)    Diabetes mellitus, type II (HCC)    Diverticulosis    DVT (deep venous thrombosis) (HCC) 02/04/2023   taking lovenox  prior to procedure   Fatty liver 03/07/2020   GERD (gastroesophageal reflux disease)    Hepatitis C    Hernia, abdominal    Hyperglycemia due to type 2 diabetes mellitus (HCC) 05/24/2020   Hyperlipidemia    IBS (irritable bowel syndrome) 02/16/2022   Insomnia    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)    Lupus    Migraine headache    Morbid obesity (HCC) 05/24/2020   Neuropathy    Nocturnal seizures (HCC)    Pain of upper abdomen 07/28/2021   Peptic ulcer    Spleen enlarged    Splenomegaly     Past Surgical History:  Procedure Laterality Date   BIOPSY  11/25/2021   Procedure: BIOPSY;  Surgeon: Eartha Angelia Sieving, MD;  [facility];  Service: Gastroenterology;;   CHOLECYSTECTOMY     COLONOSCOPY WITH PROPOFOL  N/A 11/25/2021   Procedure: COLONOSCOPY WITH PROPOFOL ;  Surgeon: Eartha Angelia Sieving, MD;  Location: [facility];  Service: Gastroenterology;  Laterality: N/A;  805   COLONOSCOPY WITH PROPOFOL  N/A 03/31/2022   Procedure: COLONOSCOPY WITH PROPOFOL ;  Surgeon:  Eartha Angelia Sieving, MD;  Location: [facility];  Service: Gastroenterology;  Laterality: N/A;  730   ESOPHAGOGASTRODUODENOSCOPY  06/2020   done at [facility], candida in upper esophagus (treated with diflucan ), ulcerative esophagitis at GE junction, gastritis in stomach, single ulcer in duodenal bulb, with duodenal mucosa showing no abnormality. No presence of varices   ESOPHAGOGASTRODUODENOSCOPY (EGD) WITH PROPOFOL  N/A 11/25/2021   Procedure: ESOPHAGOGASTRODUODENOSCOPY (EGD) WITH PROPOFOL ;  Surgeon: Eartha Angelia Sieving, MD;  [facility]: [facility];  Service: Gastroenterology;  Laterality: N/A;   ESOPHAGOGASTRODUODENOSCOPY (EGD) WITH PROPOFOL  N/A 03/31/2022   Procedure: ESOPHAGOGASTRODUODENOSCOPY (EGD) WITH PROPOFOL ;  Surgeon: Eartha Angelia Sieving, MD;  Location: [facility];  Service: Gastroenterology;  Laterality: N/A;   IVC FILTER REMOVAL N/A 09/16/2023   Procedure: IVC FILTER REMOVAL;  Surgeon: Pearline Norman RAMAN, MD;  Location: [facility];  Service: Cardiovascular;  Laterality: N/A;   PERIPHERAL VASCULAR THROMBECTOMY Left 09/16/2023   Procedure: PERIPHERAL VASCULAR THROMBECTOMY;  Surgeon: Pearline Norman RAMAN, MD;  Location: [facility] INV[facility] CV LAB;  Service: Cardiovascular;  Laterality: Left;  Social History   Socioeconomic History   Marital status: Married    Spouse name: Not on file   Number of children: Not on file   Years of education: Not on file   Highest education level: Not on file  Occupational History   Not on file  Tobacco Use   Smoking status: Former    Current packs/day: 0.50    Types: Cigarettes    Passive exposure: Current   Smokeless tobacco: Never  Vaping Use   Vaping status: Former   Substances: Nicotine , Flavoring  Substance and Sexual Activity   Alcohol use: No   Drug use: No   Sexual activity: Not Currently    Birth control/protection: Implant  Other Topics Concern   Not on file  Social History Narrative   Not on file   Social  Drivers of Health   Financial Resource Strain: Medium Risk (05/09/2020)   Overall Financial Resource Strain (CARDIA)    Difficulty of Paying Living Expenses: Somewhat hard  Food Insecurity: No Food Insecurity (09/25/2023)   Hunger Vital Sign    Worried About Running Out of Food in the Last Year: Never true    Ran Out of Food in the Last Year: Never true  Transportation Needs: No Transportation Needs (09/25/2023)   PRAPARE - Administrator, Civil Service (Medical): No    Lack of Transportation (Non-Medical): No  Physical Activity: Insufficiently Active (05/09/2020)   Exercise Vital Sign    Days of Exercise per Week: 1 day    Minutes of Exercise per Session: 20 min  Stress: Stress Concern Present (05/09/2020)   [employer] - Occupational Stress Questionnaire    Feeling of Stress : Very much  Social Connections: Unknown (03/04/2022)   Received from [facility]   Social Network    Social Network: Not on file    Family History  Problem Relation Age of Onset   Hyperlipidemia Maternal Grandfather     Outpatient Encounter Medications as of 11/04/2023  Medication Sig   acetaminophen  (TYLENOL ) 500 MG tablet Take 1,000 mg by mouth 2 (two) times daily as needed for moderate pain (pain score 4-6) or headache.   albuterol  (VENTOLIN  HFA) 108 (90 Base) MCG/ACT inhaler Inhale 2 puffs into the lungs every 6 (six) hours as needed for wheezing or shortness of breath.   apixaban  (ELIQUIS ) 5 MG TABS tablet Take 2 tablets (10 mg total) by mouth 2 (two) times daily for 6 days, THEN 1 tablet (5 mg total) 2 (two) times daily.   ARIPiprazole  (ABILIFY ) 20 MG tablet Take 1 tablet (20 mg total) by mouth daily.   clonazePAM  (KLONOPIN ) 0.5 MG tablet Take 0.5 mg by mouth 2 (two) times daily as needed for anxiety.   Continuous Glucose Sensor (DEXCOM G7 SENSOR) MISC Inject 1 Application into the skin as directed. Change sensor every 10 days as directed.   Continuous  Glucose Transmitter (DEXCOM G6 TRANSMITTER) MISC USE AS DIRECTED.   famotidine  (PEPCID ) 20 MG tablet Take 1 tablet (20 mg total) by mouth at bedtime.   ferrous sulfate  325 (65 FE) MG EC tablet Take 1 tablet (325 mg total) by mouth daily with breakfast.   glucose blood (ACCU-CHEK GUIDE) test strip Use as instructed to check blood glucose four times daily   hydrOXYzine  (VISTARIL ) 50 MG capsule Take 1 capsule (50 mg total) by mouth 3 (three) times daily as needed.   hyoscyamine  (ANASPAZ ) 0.125 MG TBDP disintergrating tablet TAKE 1 TABLET UNDER  THE TONGUE THREE TIMES A DAY AS NEEDED FOR BLADDER SPASMS OR CRAMPING.   Insulin  Pen Needle (PEN NEEDLES) 32G X 6 MM MISC 1 each by Does not apply route 3 (three) times daily.   lidocaine  (XYLOCAINE ) 5 % ointment Apply 1 Application topically as needed. (Patient taking differently: Apply 1 Application topically 2 (two) times daily as needed (pain).)   lisinopril  (ZESTRIL ) 5 MG tablet Take 5 mg by mouth daily.   methadone  (DOLOPHINE ) 10 MG/ML solution Take 65 mg by mouth daily.   methocarbamol  (ROBAXIN ) 750 MG tablet Take 750 mg by mouth every 6 (six) hours as needed for muscle spasms.   nicotine  (NICODERM CQ  - DOSED IN MG/24 HOURS) 21 mg/24hr patch Place 1 patch (21 mg total) onto the skin daily.   nystatin  cream (MYCOSTATIN ) Apply 1 Application topically 2 (two) times daily.   omeprazole  (PRILOSEC) 40 MG capsule TAKE 1 CAPSULE BY MOUTH TWICE DAILY.   ondansetron  (ZOFRAN ) 4 MG tablet Take 1 tablet (4 mg total) by mouth every 8 (eight) hours as needed for nausea or vomiting.   Oxycodone  HCl 10 MG TABS Take 1 tablet (10 mg total) by mouth every 6 (six) hours as needed (pain).   potassium chloride  (KLOR-CON ) 10 MEQ tablet Take 10 mEq by mouth 2 (two) times daily.   propranolol  (INDERAL ) 10 MG tablet Take 1 tablet (10 mg total) by mouth 2 (two) times daily.   QUEtiapine  (SEROQUEL ) 50 MG tablet Take 1 tablet (50 mg total) by mouth at bedtime.   tirzepatide   (MOUNJARO ) 7.5 MG/0.5ML Pen Inject 7.5 mg into the skin once a week.   torsemide  (DEMADEX ) 20 MG tablet Take 2 tablets (40 mg total) by mouth daily.   venlafaxine  XR (EFFEXOR  XR) 150 MG 24 hr capsule Take 1 capsule (150 mg total) by mouth daily with breakfast.   [DISCONTINUED] glipiZIDE  (GLUCOTROL  XL) 5 MG 24 hr tablet take 1 tablet by mouth daily with breakfast.   [DISCONTINUED] insulin  regular human CONCENTRATED (HUMULIN  R U-500 KWIKPEN) 500 UNIT/ML KwikPen Inject 90 Units into the skin 3 (three) times daily with meals. (Patient taking differently: Inject 100 Units into the skin 2 (two) times daily with a meal. Only if BG > 90)   [DISCONTINUED] metFORMIN  (GLUCOPHAGE ) 1000 MG tablet take 1 tablet by mouth 2 times a day with a meal. (Patient taking differently: Take 1,000 mg by mouth 2 (two) times daily with a meal.)   [DISCONTINUED] tirzepatide  (MOUNJARO ) 5 MG/0.5ML Pen Inject 5 mg into the skin every Monday.   insulin  regular human CONCENTRATED (HUMULIN  R U-500 KWIKPEN) 500 UNIT/ML KwikPen Inject 80 Units into the skin 3 (three) times daily with meals.   metFORMIN  (GLUCOPHAGE ) 1000 MG tablet Take 1 tablet (1,000 mg total) by mouth 2 (two) times daily with a meal. take 1 tablet by mouth 2 times a day with a meal.   Facility-Administered Encounter Medications as of 11/04/2023  Medication   etonogestrel  (NEXPLANON ) implant 68 mg    ALLERGIES: Allergies  Allergen Reactions   Firvanq  [Vancomycin ] Swelling    Facial swelling   Iodine -131 Anaphylaxis, Shortness Of Breath and Swelling   Ivp Dye [Iodinated Contrast Media] Anaphylaxis    Skin redness Difficulty walking   Toradol [Ketorolac Tromethamine] Shortness Of Breath    Flares ulcers   Tylenol  [Acetaminophen ] Other (See Comments)    Hx of cirrhosis    Neurontin  [Gabapentin ] Itching   Nsaids Other (See Comments)    Flares ulcers   Suboxone  [Buprenorphine Hcl-Naloxone  Hcl] Other (  See Comments)    Withdrawal symptoms     VACCINATION  STATUS: Immunization History  Administered Date(s) Administered   Influenza Inj Mdck Quad Pf 08/19/2022   Influenza, Seasonal, Injecte, Preservative Fre 09/26/2023   Influenza,inj,Quad PF,6+ Mos 07/18/2016, 07/07/2018, 07/04/2019, 07/27/2020   Influenza-Unspecified 08/11/2017, 07/19/2018   Moderna SARS-COV2 Booster Vaccination 04/09/2022   Moderna Sars-Covid-2 Vaccination 03/07/2020, 04/04/2020   PNEUMOCOCCAL CONJUGATE-20 09/26/2023   Pneumococcal Polysaccharide-23 07/18/2016, 07/07/2018, 07/04/2019, 08/01/2020   Tdap 07/04/2019    Diabetes She presents for her follow-up diabetic visit. She has type 2 diabetes mellitus. Onset time: She was diagnosed at approximate age of 28 years. Her disease course has been improving. There are no hypoglycemic associated symptoms. Pertinent negatives for hypoglycemia include no headaches or pallor. (Had symptoms of hypoglycemia when glucose dropped to 117.) Associated symptoms include foot paresthesias and weight loss. Pertinent negatives for diabetes include no blurred vision, no chest pain, no fatigue, no polydipsia, no polyphagia and no polyuria. There are no hypoglycemic complications. (History of unresponsiveness requiring EMS and hospitalization- none recent) Symptoms are improving. Diabetic complications include heart disease and peripheral neuropathy. Risk factors for coronary artery disease include diabetes mellitus, obesity, sedentary lifestyle, dyslipidemia and hypertension. Current diabetic treatment includes oral agent (dual therapy) and intensive insulin  program (and Mounjaro ). She is compliant with treatment most of the time (t). Her weight is decreasing steadily. She is following a low salt and generally healthy diet. When asked about meal planning, she reported none. She has had a previous visit with a dietitian. She participates in exercise intermittently. Her home blood glucose trend is decreasing steadily. Her overall blood glucose range is  140-180 mg/dl. (She presents today, accompanied by her mother, with her CGM on her phone showing at goal glycemic profile overall.  Her POCT A1c today is 7.8%, improving from last visit of 9.4%.  She does have quite frequent hypoglycemia.  She has tolerated the Mounjaro  well.) An ACE inhibitor/angiotensin II receptor blocker is being taken. She does not see a podiatrist.Eye exam is not current.  Hypertension This is a chronic problem. The current episode started more than 1 month ago. The problem has been resolved since onset. The problem is controlled. Associated symptoms include peripheral edema. Pertinent negatives include no blurred vision, chest pain, headaches, palpitations or shortness of breath. There are no associated agents to hypertension. Risk factors for coronary artery disease include diabetes mellitus, dyslipidemia, obesity and sedentary lifestyle. Past treatments include diuretics and beta blockers. The current treatment provides no improvement. There are no compliance problems.     Review of systems  Constitutional: +decreasing body weight,  current Body mass index is 32.93 kg/m. , + fatigue, no subjective hyperthermia, no subjective hypothermia Eyes: no blurry vision, no xerophthalmia ENT: no sore throat, no nodules palpated in throat, no dysphagia/odynophagia, no hoarseness Cardiovascular: no chest pain, no shortness of breath, no palpitations, no leg swelling Respiratory: no cough, no shortness of breath Gastrointestinal: no nausea/vomiting/diarrhea Musculoskeletal: no muscle/joint aches Skin: no rashes, no hyperemia Neurological: no tremors, no numbness, no tingling, no dizziness Psychiatric: + depression-greatly improved, no anxiety  Objective:     BP 98/60 (BP Location: Right Arm, Patient Position: Sitting)   Pulse 88   Ht 5' 9 (1.753 m)   Wt 223 lb (101.2 kg)   BMI 32.93 kg/m   Wt Readings from Last 3 Encounters:  11/04/23 223 lb (101.2 kg)  10/22/23 235 lb  (106.6 kg)  10/21/23 235 lb 8 oz (106.8 kg)  BP Readings from Last 3 Encounters:  11/04/23 98/60  10/22/23 103/68  10/21/23 109/63    Physical Exam- Limited  Constitutional:  Body mass index is 32.93 kg/m. , not in acute distress, normal state of mind Eyes:  EOMI, no exophthalmos Musculoskeletal: no gross deformities, strength intact in all four extremities, no gross restriction of joint movements Skin:  no rashes, no hyperemia Neurological: no tremor with outstretched hands   Diabetic Foot Exam - Simple   No data filed    CMP ( most recent) CMP     Component Value Date/Time   NA 140 10/05/2023 1043   K 4.3 10/05/2023 1043   CL 102 10/05/2023 1043   CO2 25 10/05/2023 1043   GLUCOSE 117 (H) 10/05/2023 1043   GLUCOSE 249 (H) 09/28/2023 0438   BUN 7 10/05/2023 1043   CREATININE 0.61 10/05/2023 1043   CREATININE 0.66 05/18/2022 1113   CALCIUM  8.9 10/05/2023 1043   PROT 5.6 (L) 09/26/2023 0344   PROT 5.8 (L) 01/01/2023 1408   ALBUMIN  2.3 (L) 09/26/2023 0344   ALBUMIN  3.3 (L) 01/01/2023 1408   AST 20 09/26/2023 0344   ALT 18 09/26/2023 0344   ALKPHOS 150 (H) 09/26/2023 0344   BILITOT 0.9 09/26/2023 0344   BILITOT 1.3 (H) 01/01/2023 1408   GFRNONAA >60 09/28/2023 0438   GFRAA >60 08/01/2020 0704     Diabetic Labs (most recent): Lab Results  Component Value Date   HGBA1C 7.8 (A) 11/04/2023   HGBA1C 9.4 (A) 07/26/2023   HGBA1C 9.3 (H) 01/01/2023   MICROALBUR 30 12/10/2021   MICROALBUR 80 02/26/2021     Lipid Panel ( most recent) Lipid Panel     Component Value Date/Time   CHOL 119 09/17/2023 0426   CHOL 134 01/01/2023 1408   TRIG 92 09/17/2023 0426   HDL 31 (L) 09/17/2023 0426   HDL 35 (L) 01/01/2023 1408   CHOLHDL 3.8 09/17/2023 0426   VLDL 18 09/17/2023 0426   LDLCALC 70 09/17/2023 0426   LDLCALC 57 01/01/2023 1408   LABVLDL 42 (H) 01/01/2023 1408      Lab Results  Component Value Date   TSH 2.280 01/01/2023   TSH 2.700 12/03/2021   TSH  0.635 09/08/2020   TSH 0.993 07/30/2020   TSH 1.990 07/23/2020   TSH 1.690 08/22/2010   FREET4 0.97 01/01/2023   FREET4 0.70 (L) 12/03/2021   FREET4 0.72 (L) 07/23/2020   FREET4 1.11 08/22/2010      Assessment & Plan:   1) Type 2 diabetes mellitus with hyperglycemia, without long-term current use of insulin  (HCC)  - Jaime [name] has currently uncontrolled symptomatic type 2 DM since  38 years of age.  She presents today, accompanied by her mother, with her CGM on her phone showing at goal glycemic profile overall.  Her POCT A1c today is 7.8%, improving from last visit of 9.4%.  She does have quite frequent hypoglycemia.  She has tolerated the Mounjaro  well.  She notes she is getting divorced and her grandpa is in hospice, so she is a bit more stressed.  - I had a long discussion with her about the progressive nature of diabetes and the pathology behind its complications.  -her diabetes is complicated by obesity/sedentary life and she remains at a high risk for more acute and chronic complications which include CAD, CVA, CKD, retinopathy, and neuropathy. These are all discussed in detail with her.  - Nutritional counseling repeated at each appointment due to patients tendency to  fall back in to old habits.  - The patient admits there is a room for improvement in their diet and drink choices. -  Suggestion is made for the patient to avoid simple carbohydrates from their diet including Cakes, Sweet Desserts / Pastries, Ice Cream, Soda (diet and regular), Sweet Tea, Candies, Chips, Cookies, Sweet Pastries, Store Bought Juices, Alcohol in Excess of 1-2 drinks a day, Artificial Sweeteners, Coffee Creamer, and Sugar-free Products. This will help patient to have stable blood glucose profile and potentially avoid unintended weight gain.   - I encouraged the patient to switch to unprocessed or minimally processed complex starch and increased protein intake (animal or plant source), fruits,  and vegetables.   - Patient is advised to stick to a routine mealtimes to eat 3 meals a day and avoid unnecessary snacks (to snack only to correct hypoglycemia).  - I have approached her with the following individualized plan to manage  her diabetes and patient agrees:   -She is advised to decrease her U500 to 80 units TID with meals if glucose is above 90 and she is eating.  Will stop her Glipizide  altogether today.  She can continue her Metformin  1000 mg po twice daily with meals.  Will increase her Mounjaro  to 7.5 mg SQ weekly.  -She is encouraged to continue to monitor glucose 4 times daily (using her CGM), before meals and before bed and log on the clinic sheets provided.  They are instructed to call the clinic if she has readings less than 70 or greater than 300 for 3 tests in a row.     - Specific targets for  A1c;  LDL, HDL,  and Triglycerides were discussed with the patient.  2) Blood Pressure /Hypertension: Her blood pressure is controlled to target.  She is advised to continue Lasix  80 mg po daily, continue Lisinopril  5 mg po daily, continue Propanolol 10 mg po TID, and Aldactone  100 mg o daily  3) Lipids/Hyperlipidemia:   Review of her recent lipid profile from 03/11/23 shows controlled LDL at 61 and elevated triglycerides of 255 (slowly improving).  She is advised to continue Crestor  10 mg po daily at bedtime.  Side effects and precautions discussed with her.    4)  Weight/Diet:  Her Body mass index is 32.93 kg/m.  -   clearly complicating her diabetes care.   she is  a candidate for modest weight loss. I discussed with her the fact that loss of 5 - 10% of her  current body weight will have the most impact on her diabetes management.  Exercise, and detailed carbohydrates information provided  -  detailed on discharge instructions.  5) Vitamin D  deficiency Her most recent vitamin d  level on 12/03/21 was 9.8.  I discussed and initiated replenishment with Ergocalciferol  50000 units po  weekly- she is still taking this.  6) Chronic Care/Health Maintenance: -she is on ACE and statin medications and is encouraged to initiate and continue to follow up with Ophthalmology, Dentist,  Podiatrist at least yearly or according to recommendations, and advised to stay away from smoking. I have recommended yearly flu vaccine and pneumonia vaccine at least every 5 years; moderate intensity exercise for up to 150 minutes weekly; and  sleep for at least 7 hours a day.  - she is advised to maintain close follow up with Del Wilhelmena Lloyd Sola, FNP for primary care needs, as well as her other providers for optimal and coordinated care.     I spent  44  minutes in the care of the patient today including review of labs from CMP, Lipids, Thyroid Function, Hematology (current and previous including abstractions from other facilities); face-to-face time discussing  her blood glucose readings/logs, discussing hypoglycemia and hyperglycemia episodes and symptoms, medications doses, her options of short and long term treatment based on the latest standards of care / guidelines;  discussion about incorporating lifestyle medicine;  and documenting the encounter. Risk reduction counseling performed per USPSTF guidelines to reduce obesity and cardiovascular risk factors.     Please refer to Patient Instructions for Blood Glucose Monitoring and Insulin /Medications Dosing Guide  in media tab for additional information. Please  also refer to  Patient Self Inventory in the Media  tab for reviewed elements of pertinent patient history.  Jaime [name] participated in the discussions, expressed understanding, and voiced agreement with the above plans.  All questions were answered to her satisfaction. she is encouraged to contact clinic should she have any questions or concerns prior to her return visit.    Follow up plan: - Return in about 4 months (around 03/03/2024) for Diabetes F/U with A1c in office, No  previsit labs, Bring meter and logs.    Benton Rio, [facility] [address] [place], [place] [address] Phone: [phone] Fax: [phone]   11/04/2023, 9:35 AM

## 2023-11-08 ENCOUNTER — Other Ambulatory Visit: Payer: Self-pay

## 2023-11-08 DIAGNOSIS — I824Y2 Acute embolism and thrombosis of unspecified deep veins of left proximal lower extremity: Secondary | ICD-10-CM

## 2023-11-09 ENCOUNTER — Other Ambulatory Visit (HOSPITAL_COMMUNITY): Payer: Self-pay

## 2023-11-11 ENCOUNTER — Other Ambulatory Visit (INDEPENDENT_AMBULATORY_CARE_PROVIDER_SITE_OTHER): Payer: Self-pay | Admitting: Gastroenterology

## 2023-11-11 MED ORDER — DIPHENHYDRAMINE HCL 50 MG PO CAPS: ORAL_CAPSULE | ORAL | 0 refills | Status: DC

## 2023-11-11 MED ORDER — PREDNISONE 50 MG PO TABS: ORAL_TABLET | ORAL | 0 refills | Status: DC

## 2023-11-12 NOTE — Telephone Encounter (Signed)
 Pt contacted and verbalized understanding.

## 2023-11-16 ENCOUNTER — Ambulatory Visit: Payer: MEDICAID | Admitting: Women's Health

## 2023-11-18 ENCOUNTER — Ambulatory Visit: Payer: MEDICAID | Admitting: Family Medicine

## 2023-11-19 ENCOUNTER — Ambulatory Visit: Payer: MEDICAID | Admitting: Vascular Surgery

## 2023-11-19 ENCOUNTER — Ambulatory Visit (HOSPITAL_COMMUNITY): Payer: MEDICAID

## 2023-11-19 DIAGNOSIS — I82422 Acute embolism and thrombosis of left iliac vein: Secondary | ICD-10-CM

## 2023-11-22 ENCOUNTER — Encounter: Payer: Self-pay | Admitting: Women's Health

## 2023-11-22 ENCOUNTER — Ambulatory Visit: Payer: MEDICAID | Admitting: Women's Health

## 2023-11-22 VITALS — BP 105/56 | HR 90 | Ht 69.0 in | Wt 272.0 lb

## 2023-11-22 DIAGNOSIS — Z30431 Encounter for routine checking of intrauterine contraceptive device: Secondary | ICD-10-CM

## 2023-11-22 NOTE — Progress Notes (Signed)
GYN VISIT Patient name: Jaime [name] MRN [id number]  Date of birth: 12/10/1985 Chief Complaint:   Follow-up (IUD check/)  History of Present Illness:   Jaime [name] is a 38 y.o. G0P0000 Caucasian female being seen today for IUD f/u.   Paragard IUD inserted 10/06/23. No pain. More frequent bleeding since insertion.  No LMP recorded. (Menstrual status: IUD). The current method of family planning is IUD.  Last pap 10/06/23. Results were: NILM w/ HRHPV negative     10/22/2023    8:10 AM 10/05/2023    9:49 AM 09/14/2023   10:02 AM 07/02/2023    2:22 PM 05/25/2023    3:48 PM  Depression screen PHQ 2/9  Decreased Interest 3 3 3 3 3   Down, Depressed, Hopeless 3 3 3 3 3   PHQ - 2 Score 6 6 6 6 6   Altered sleeping 3 3 3 3 3   Tired, decreased energy 3 3 3 3 3   Change in appetite 3 3 3 3 3   Feeling bad or failure about yourself  3 3 3 3 3   Trouble concentrating 3 3 3 3 3   Moving slowly or fidgety/restless 3 3 3 3 3   Suicidal thoughts 0 0 0 0 0  PHQ-9 Score 24 24 24 24 24   Difficult doing work/chores Extremely dIfficult Extremely dIfficult Extremely dIfficult Very difficult Extremely dIfficult        10/22/2023    8:10 AM 10/05/2023    9:49 AM 09/14/2023   10:02 AM 05/25/2023    3:48 PM  GAD 7 : Generalized Anxiety Score  Nervous, Anxious, on Edge 3 3 3 3   Control/stop worrying 3 3 3 3   Worry too much - different things 3 3 3 3   Trouble relaxing 3 3 3 3   Restless 3 3 3 3   Easily annoyed or irritable 3 0 3 3  Afraid - awful might happen 3 0 3 3  Total GAD 7 Score 21 15 21 21   Anxiety Difficulty Extremely difficult Somewhat difficult Extremely difficult Extremely difficult     Review of Systems:   Pertinent items are noted in HPI Denies fever/chills, dizziness, headaches, visual disturbances, fatigue, shortness of breath, chest pain, abdominal pain, vomiting, abnormal vaginal discharge/itching/odor/irritation, problems with periods, bowel movements, urination, or intercourse  unless otherwise stated above.  Pertinent History Reviewed:  Reviewed past medical,surgical, social, obstetrical and family history.  Reviewed problem list, medications and allergies. Physical Assessment:   Vitals:   11/22/23 1353  BP: (!) 105/56  Pulse: 90  Weight: 272 lb (123.4 kg)  Height: 5\' 9"  (1.753 m)  Body mass index is 40.17 kg/m.       Physical Examination:   General appearance: alert, well appearing, and in no distress  Mental status: alert, oriented to person, place, and time  Skin: warm & dry   Cardiovascular: normal heart rate noted  Respiratory: normal respiratory effort, no distress  Abdomen: soft, non-tender   Pelvic: VULVA: normal appearing vulva with no masses, tenderness or lesions, VAGINA: normal appearing vagina with normal color and discharge, no lesions, normal menstrual blood CERVIX: normal appearing cervix without discharge or lesions, strings visible and appropriate length  Extremities: no edema   Chaperone: Latisha Cresenzo    No results found for this or any previous visit (from the past 24 hours).  Assessment & Plan:  1) IUD check> in place, more frequent bleeding-to give body a little more time to get used to IUD, if doesn't improve let us know  Meds: No orders of the defined types were placed in this encounter.   No orders of the defined types were placed in this encounter.   Return in about 1 year (around 11/21/2024) for Physical.  Cheral Marker CNM, WHNP-BC 11/22/2023 2:22 PM

## 2023-11-26 ENCOUNTER — Ambulatory Visit (HOSPITAL_COMMUNITY): Payer: MEDICAID

## 2023-12-01 ENCOUNTER — Ambulatory Visit (HOSPITAL_COMMUNITY): Admission: RE | Admit: 2023-12-01 | Payer: MEDICAID | Source: Ambulatory Visit

## 2023-12-07 ENCOUNTER — Telehealth (INDEPENDENT_AMBULATORY_CARE_PROVIDER_SITE_OTHER): Payer: Self-pay | Admitting: Gastroenterology

## 2023-12-07 NOTE — Telephone Encounter (Signed)
PA approved via [employer] for CT.

## 2023-12-08 ENCOUNTER — Ambulatory Visit (HOSPITAL_COMMUNITY)
Admission: RE | Admit: 2023-12-08 | Discharge: 2023-12-08 | Disposition: A | Discharge status: Home / Self Care | Payer: MEDICAID | Source: Ambulatory Visit | Attending: Gastroenterology | Admitting: Gastroenterology

## 2023-12-08 DIAGNOSIS — R112 Nausea with vomiting, unspecified: Secondary | ICD-10-CM | POA: Insufficient documentation

## 2023-12-08 DIAGNOSIS — R1010 Upper abdominal pain, unspecified: Secondary | ICD-10-CM | POA: Diagnosis present

## 2023-12-08 LAB — POCT I-STAT CREATININE: Creatinine, Ser: 0.6 mg/dL (ref 0.44–1.00)

## 2023-12-08 MED ORDER — IOHEXOL 300 MG/ML  SOLN
100.0000 mL | Freq: Once | INTRAMUSCULAR | Status: AC | PRN
  Administered 2023-12-08: 100 mL via INTRAVENOUS

## 2023-12-08 NOTE — Patient Instructions (Signed)

## 2023-12-08 NOTE — Progress Notes (Signed)
 Established Patient Office Visit   Subjective  Patient ID: TATYM SCHERMER, female    DOB: [birth year]  Age: 38 y.o. MRN: [id number]  Chief Complaint  Patient presents with   Medical Management of Chronic Issues    Management of chronic Symptoms  Trouble falling and staying asleep Severe back pain     She  has a past medical history of Acute encephalopathy (08/31/2020), Adjustment disorder with depressed mood (09/07/2020), Anxiety, Asthma, Chronic abdominal pain, Chronic back pain, Chronic prescription opiate use (02/16/2022), Cirrhosis (HCC), Cirrhosis (HCC), COPD (chronic obstructive pulmonary disease) (HCC), Depression, Diabetes mellitus without complication (HCC), Diabetes mellitus, type II (HCC), Diverticulosis, DVT (deep venous thrombosis) (HCC) (02/04/2023), Fatty liver (03/07/2020), GERD (gastroesophageal reflux disease), Hepatitis C, Hernia, abdominal, Hyperglycemia due to type 2 diabetes mellitus (HCC) (05/24/2020), Hyperlipidemia, IBS (irritable bowel syndrome) (02/16/2022), Insomnia, 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  Back Pain: The patient has a chronic history of back pain, with the current episode beginning over a year ago. The pain is constant and has gradually worsened since onset. It is located in the lumbar spine and is described as aching, burning, cramping, and stabbing. The pain radiates to both the left and right thighs and is rated 9/10 in severity. Symptoms are aggravated by lying down, sitting, and certain positions. Associated symptoms include leg pain, numbness, paresthesias, and tingling. There are no signs of bladder or bowel incontinence or pelvic pain. Risk factors include a sedentary lifestyle, obesity, poor posture, and lack of exercise. The patient has  tried bed rest, heat, NSAIDs, and a muscle relaxant, which provided only mild relief.    Review of Systems  Constitutional:  Negative for chills and fever.  Eyes:  Negative for blurred vision.  Respiratory:  Negative for shortness of breath.   Cardiovascular:  Negative for chest pain.  Musculoskeletal:  Positive for back pain, joint pain and myalgias.  Neurological:  Negative for dizziness and headaches.      Objective:     BP 103/64   Pulse 80   Ht 5' 9 (1.753 m)   Wt 224 lb (101.6 kg)   LMP 11/15/2023 (Approximate)   SpO2 91%   BMI 33.08 kg/m  BP Readings from Last 3 Encounters:  12/09/23 103/64  11/22/23 (!) 105/56  11/04/23 98/60      Physical Exam Vitals reviewed.  Constitutional:      General: She is not in acute distress.    Appearance: Normal appearance. She is not ill-appearing, toxic-appearing or diaphoretic.  HENT:     Head: Normocephalic.  Eyes:     General:        Right eye: No discharge.        Left eye: No discharge.     Conjunctiva/sclera: Conjunctivae normal.  Cardiovascular:     Rate and Rhythm: Normal rate.     Pulses: Normal pulses.     Heart sounds: Normal heart sounds.  Pulmonary:     Effort: Pulmonary effort is normal. No respiratory distress.     Breath sounds: Normal breath sounds.  Musculoskeletal:        General: Tenderness present.     Cervical back: Normal range of motion.     Lumbar back: Spasms present. Decreased range of motion. Positive right straight leg raise test and positive left straight leg raise test.  Skin:  General: Skin is warm and dry.     Capillary Refill: Capillary refill takes less than 2 seconds.  Neurological:     Mental Status: She is alert.     Coordination: Coordination normal.     Gait: Gait normal.  Psychiatric:        Mood and Affect: Mood normal.        Behavior: Behavior normal.      No results found for any visits on 12/09/23.  The ASCVD Risk score (Arnett DK, et al., 2019) failed to  calculate for the following reasons:   The 2019 ASCVD risk score is only valid for ages 76 to 62    Assessment & Plan:  Lumbar pain Assessment & Plan: Trial on Prednisone  20 mg twice daily x 5 days Referral placed to physical therapy Previous xray showed: Mild disc space narrowing and degenerative change L1-L2,  L2-L3 and L4-L5.  We discussed the desired effects and potential side effects of the prescribed medication for back pain. Additionally, we reviewed non-pharmacological interventions, including the importance of rest, avoiding twisting, improper bending, and straining the lower back. I demonstrated proper body mechanics to prevent further injury and advised alternating between ice and heat therapy for relief. Stretching exercises for both the back and legs were recommended to improve flexibility and support recovery. The patient was advised to follow up if symptoms worsen or persist. The patient expressed understanding of the treatment plan, and all questions were thoroughly addressed.   Orders: -     Ambulatory referral to Physical Therapy -     Ambulatory referral to Orthopedics -     predniSONE ; Take 1 tablet (20 mg total) by mouth 2 (two) times daily with a meal for 5 days.  Dispense: 10 tablet; Refill: 0 -     methylPREDNISolone  Acetate  Other orders -     QUEtiapine  Fumarate; Take 1 tablet (100 mg total) by mouth at bedtime.  Dispense: 30 tablet; Refill: 3    Return if symptoms worsen or fail to improve.   Hilario Kidd Wilhelmena Falter, FNP

## 2023-12-09 ENCOUNTER — Ambulatory Visit: Payer: MEDICAID | Admitting: Family Medicine

## 2023-12-09 VITALS — BP 103/64 | HR 80 | Ht 69.0 in | Wt 224.0 lb

## 2023-12-09 DIAGNOSIS — M545 Low back pain, unspecified: Secondary | ICD-10-CM | POA: Insufficient documentation

## 2023-12-09 DIAGNOSIS — E559 Vitamin D deficiency, unspecified: Secondary | ICD-10-CM

## 2023-12-09 MED ORDER — METHYLPREDNISOLONE ACETATE 40 MG/ML IJ SUSP: 40.0000 mg | Freq: Once | INTRAMUSCULAR | Status: DC

## 2023-12-09 MED ORDER — QUETIAPINE FUMARATE 100 MG PO TABS: 100.0000 mg | ORAL_TABLET | Freq: Every day | ORAL | 3 refills | Status: DC

## 2023-12-09 MED ORDER — METHYLPREDNISOLONE ACETATE 80 MG/ML IJ SUSP
40.0000 mg | Freq: Once | INTRAMUSCULAR | Status: AC
  Administered 2023-12-09: 40 mg via INTRAMUSCULAR

## 2023-12-09 MED ORDER — PREDNISONE 20 MG PO TABS: 20.0000 mg | ORAL_TABLET | Freq: Two times a day (BID) | ORAL | 0 refills | Status: DC

## 2023-12-09 NOTE — Addendum Note (Signed)
 Addended by: Meriam Stamp on: 12/09/2023 02:41 PM   Modules accepted: Orders

## 2023-12-09 NOTE — Assessment & Plan Note (Signed)
 Trial on Prednisone  20 mg twice daily x 5 days Referral placed to physical therapy Previous xray showed: Mild disc space narrowing and degenerative change L1-L2,  L2-L3 and L4-L5.  We discussed the desired effects and potential side effects of the prescribed medication for back pain. Additionally, we reviewed non-pharmacological interventions, including the importance of rest, avoiding twisting, improper bending, and straining the lower back. I demonstrated proper body mechanics to prevent further injury and advised alternating between ice and heat therapy for relief. Stretching exercises for both the back and legs were recommended to improve flexibility and support recovery. The patient was advised to follow up if symptoms worsen or persist. The patient expressed understanding of the treatment plan, and all questions were thoroughly addressed.

## 2023-12-10 ENCOUNTER — Other Ambulatory Visit: Payer: Self-pay

## 2023-12-10 DIAGNOSIS — I824Y2 Acute embolism and thrombosis of unspecified deep veins of left proximal lower extremity: Secondary | ICD-10-CM

## 2023-12-10 DIAGNOSIS — L03116 Cellulitis of left lower limb: Secondary | ICD-10-CM

## 2023-12-13 ENCOUNTER — Ambulatory Visit (INDEPENDENT_AMBULATORY_CARE_PROVIDER_SITE_OTHER): Payer: MEDICAID | Admitting: Gastroenterology

## 2023-12-13 ENCOUNTER — Encounter (INDEPENDENT_AMBULATORY_CARE_PROVIDER_SITE_OTHER): Payer: Self-pay | Admitting: Gastroenterology

## 2023-12-13 ENCOUNTER — Encounter (INDEPENDENT_AMBULATORY_CARE_PROVIDER_SITE_OTHER): Payer: Self-pay

## 2023-12-13 VITALS — BP 107/65 | HR 85 | Temp 98.3°F | Ht 70.0 in | Wt 229.1 lb

## 2023-12-13 DIAGNOSIS — K7460 Unspecified cirrhosis of liver: Secondary | ICD-10-CM

## 2023-12-13 DIAGNOSIS — I824Y2 Acute embolism and thrombosis of unspecified deep veins of left proximal lower extremity: Secondary | ICD-10-CM

## 2023-12-13 DIAGNOSIS — R109 Unspecified abdominal pain: Secondary | ICD-10-CM | POA: Diagnosis not present

## 2023-12-13 DIAGNOSIS — K581 Irritable bowel syndrome with constipation: Secondary | ICD-10-CM

## 2023-12-13 DIAGNOSIS — G8929 Other chronic pain: Secondary | ICD-10-CM | POA: Diagnosis not present

## 2023-12-13 DIAGNOSIS — R112 Nausea with vomiting, unspecified: Secondary | ICD-10-CM

## 2023-12-13 MED ORDER — HYOSCYAMINE SULFATE 0.125 MG PO TBDP: 0.1250 mg | ORAL_TABLET | Freq: Four times a day (QID) | ORAL | 1 refills | Status: DC | PRN

## 2023-12-13 NOTE — Progress Notes (Signed)
Jaime [name], M.D. Gastroenterology & Hepatology [facility] Gastroenterology [address] [place], [place] [address]  Primary Care Physician: Rica Records, [place] [address] [place] [address]  I will communicate my assessment and recommendations to the referring MD via EMR.  Problems: Decompensated liver cirrhosis secondary to hepatitis C Hepatitis C 3a status post Mavyret with SVR  History of Present Illness: Jaime [name] is a 38 y.o. 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  The patient was last seen on 10/21/2023. At that time, the patient had right upper quadrant ultrasound ordered but she did not schedule this.  She has recurrent episode of epigastric pain that have had management with PPI twice daily (omeprazole 40 mg) and famotidine 20 mg at bedtime.  She has been continued on torsemide 40 mg every day.  She was ordered to have a CT of the abdomen pelvis with IV contrast.Patient underwent a CT of the abdomen pelvis with IV contrast on 12/08/2023.  No report is available yet.  Upon my per personal review of the imaging, patient had presence of changes compatible with cirrhosis and splenomegaly but no acute maladies, there was presence of moderate amount of stool in the colon.  Endoscopic evaluation of her symptoms has not been possible as she is on chronic anticoagulation and cannot be held yet.  Patient reports her abdomen is persistently painful. States her whole abdomen is tender all the time, 9/10 in severity. Not currently taking any medications for her pain.  She feels nauseated frequently, states that Zofran sometimes helps controlling her nausea. Does not have frequent  vomiting.  She is having large amount of blood in stool, possibly 1-2 timer per week. Having at least one bowel movement per day, does not strain to move her bowels  The patient denies having any fever, chills, hematochezia, melena, hematemesis, diarrhea, jaundice, pruritus .  Most recent labs from 10/05/2023 showed CBC with hemoglobin 12.0, WBC 7.5, platelets 186, BMP with sodium 140, potassium 4.3, creatinine 0.61, BUN 7.  Patient takes [facility] for diabetes.  Cirrhosis related questions: Hematemesis/coffee ground emesis: No Abdominal pain: yes Abdominal distention/worsening ascitesNo Fever/chills: No Episodes of confusion/disorientation: No Number of daily bowel movements:once a day Taking diuretics?: Yes, torsemide 40 mg  History of variceal bleeding: No Prior history of banding?: No Prior episodes of SBP: No Last time liver imaging was performed: 08/07/2021 [place], changes of cirrhosis Last AFP: 10/21/23 - 2.4 MELD 3.0 score: 09/25/23 - 9 Currently consuming alcohol: No Hepatitis A and B vaccination status: patient reports she was vaccinated  Last Colonoscopy: 03/2022 - Preparation of the colon was poor.                           - Hemorrhoids found on perianal exam.                           - Stool in the rectum, in the sigmoid colon, in the                            descending colon and in the transverse  colon.                           - No specimens collected (repeat at age 32) Last EGD: 01/2023 [facility] esophageal plaques concerning for candida. Biopsied  - Nodular gastritis. Biopsied  (Mild chronic inactive gastritis with focal glandular epithelial reactive changes, No intestinal metaplasia, dysplasia or malignancy is seen. Microorganisms morphologically consistent with H pylori are not identified, no fungal infection seen on esophageal biopsy)  Past Medical History: Past Medical History:  Diagnosis Date   Acute encephalopathy 08/31/2020   Adjustment disorder with  depressed mood 09/07/2020   Anxiety    Asthma    Chronic abdominal pain    Chronic back pain    Chronic prescription opiate use 02/16/2022   Cirrhosis (HCC)    Cirrhosis (HCC)    COPD (chronic obstructive pulmonary disease) (HCC)    Depression    Diabetes mellitus without complication (HCC)    Diabetes mellitus, type II (HCC)    Diverticulosis    DVT (deep venous thrombosis) (HCC) 02/04/2023   taking lovenox prior to procedure   Fatty liver 03/07/2020   GERD (gastroesophageal reflux disease)    Hepatitis C    Hernia, abdominal    Hyperglycemia due to type 2 diabetes mellitus (HCC) 05/24/2020   Hyperlipidemia    IBS (irritable bowel syndrome) 02/16/2022   Insomnia    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)    Lupus    Migraine headache    Morbid obesity (HCC) 05/24/2020   Neuropathy    Nocturnal seizures (HCC)    Pain of upper abdomen 07/28/2021   Peptic ulcer    Spleen enlarged    Splenomegaly     Past Surgical History: Past Surgical History:  Procedure Laterality Date   BIOPSY  11/25/2021   Procedure: BIOPSY;  Surgeon: Dolores Frame, MD;  [facility];  Service: Gastroenterology;;   CHOLECYSTECTOMY     COLONOSCOPY WITH PROPOFOL N/A 11/25/2021   Procedure: COLONOSCOPY WITH PROPOFOL;  Surgeon: Dolores Frame, MD;  [facility];  Service: Gastroenterology;  Laterality: N/A;  805   COLONOSCOPY WITH PROPOFOL N/A 03/31/2022   Procedure: COLONOSCOPY WITH PROPOFOL;  Surgeon: Dolores Frame, MD;  [facility];  Service: Gastroenterology;  Laterality: N/A;  730   ESOPHAGOGASTRODUODENOSCOPY  06/2020   done at [facility], candida in upper esophagus (treated with diflucan), ulcerative esophagitis at GE junction, gastritis in stomach, single ulcer in duodenal bulb, with duodenal mucosa showing no abnormality. No presence of varices   ESOPHAGOGASTRODUODENOSCOPY (EGD) WITH PROPOFOL N/A 11/25/2021    Procedure: ESOPHAGOGASTRODUODENOSCOPY (EGD) WITH PROPOFOL;  Surgeon: Dolores Frame, MD;  [facility];  Service: Gastroenterology;  Laterality: N/A;   ESOPHAGOGASTRODUODENOSCOPY (EGD) WITH PROPOFOL N/A 03/31/2022   Procedure: ESOPHAGOGASTRODUODENOSCOPY (EGD) WITH PROPOFOL;  Surgeon: Dolores Frame, MD;  [facility];  Service: Gastroenterology;  Laterality: N/A;   IVC FILTER REMOVAL N/A 09/16/2023   Procedure: IVC FILTER REMOVAL;  Surgeon: [name], MD;  Location: [facility] INVASIVE CV LAB;  Service: Cardiovascular;  Laterality: N/A;   PERIPHERAL VASCULAR THROMBECTOMY Left 09/16/2023   Procedure: PERIPHERAL VASCULAR THROMBECTOMY;  Surgeon: [name], MD;  Location: [facility] INVASIVE CV LAB;  Service: Cardiovascular;  Laterality: Left;    Family History: Family History  Problem Relation Age of Onset   Hyperlipidemia Maternal Grandfather     Social History: Social History  Tobacco Use  Smoking Status Every Day   Current packs/day: 0.50   Types: Cigarettes   Passive exposure: Current  Smokeless Tobacco Never   Social History   Substance and Sexual Activity  Alcohol Use No   Social History   Substance and Sexual Activity  Drug Use No    Allergies: Allergies  Allergen Reactions   Firvanq [Vancomycin] Swelling    Facial swelling   Iodine-131 Anaphylaxis, Shortness Of Breath and Swelling   Ivp Dye [Iodinated Contrast Media] Anaphylaxis    Skin redness Difficulty walking   Toradol [Ketorolac Tromethamine] Shortness Of Breath    Flares ulcers   Tylenol [Acetaminophen] Other (See Comments)    Hx of cirrhosis    Neurontin [Gabapentin] Itching   Nsaids Other (See Comments)    Flares ulcers   Suboxone [Buprenorphine Hcl-Naloxone Hcl] Other (See Comments)    Withdrawal symptoms     Medications: Current Outpatient Medications  Medication Sig Dispense Refill   acetaminophen (TYLENOL) 500 MG tablet Take 1,000 mg by mouth 2  (two) times daily as needed for moderate pain (pain score 4-6) or headache.     albuterol (VENTOLIN HFA) 108 (90 Base) MCG/ACT inhaler Inhale 2 puffs into the lungs every 6 (six) hours as needed for wheezing or shortness of breath. 8 g 0   apixaban (ELIQUIS) 5 MG TABS tablet Take 2 tablets (10 mg total) by mouth 2 (two) times daily for 6 days, THEN 1 tablet (5 mg total) 2 (two) times daily. 60 tablet 4   ARIPiprazole (ABILIFY) 20 MG tablet Take 1 tablet (20 mg total) by mouth daily. 30 tablet 2   clonazePAM (KLONOPIN) 0.5 MG tablet Take 0.5 mg by mouth 2 (two) times daily as needed for anxiety.     Continuous Glucose Sensor (DEXCOM G7 SENSOR) MISC Inject 1 Application into the skin as directed. Change sensor every 10 days as directed. 9 each 3   Continuous Glucose Transmitter (DEXCOM G6 TRANSMITTER) MISC USE AS DIRECTED. 1 each 1   famotidine (PEPCID) 20 MG tablet Take 1 tablet (20 mg total) by mouth at bedtime.     ferrous sulfate 325 (65 FE) MG EC tablet Take 1 tablet (325 mg total) by mouth daily with breakfast. 90 tablet 3   glucose blood (ACCU-CHEK GUIDE) test strip Use as instructed to check blood glucose four times daily 150 each 5   hydrOXYzine (VISTARIL) 50 MG capsule Take 1 capsule (50 mg total) by mouth 3 (three) times daily as needed. 30 capsule 2   hyoscyamine (ANASPAZ) 0.125 MG TBDP disintergrating tablet TAKE 1 TABLET UNDER THE TONGUE THREE TIMES A DAY AS NEEDED FOR BLADDER SPASMS OR CRAMPING. 90 tablet 0   Insulin Pen Needle (PEN NEEDLES) 32G X 6 MM MISC 1 each by Does not apply route 3 (three) times daily. 100 each 3   insulin regular human CONCENTRATED (HUMULIN R U-500 KWIKPEN) 500 UNIT/ML KwikPen Inject 80 Units into the skin 3 (three) times daily with meals. (Patient taking differently: Inject 90 Units into the skin 3 (three) times daily with meals.) 45 mL 3   lidocaine (XYLOCAINE) 5 % ointment Apply 1 Application topically as needed. (Patient taking differently: Apply 1 Application  topically 2 (two) times daily as needed (pain).) 35.44 g 5   lisinopril (ZESTRIL) 5 MG tablet Take 5 mg by mouth daily.     metFORMIN (GLUCOPHAGE) 1000 MG tablet Take 1 tablet (1,000 mg total) by mouth 2 (two) times daily with a meal. take 1  tablet by mouth 2 times a day with a meal. 180 tablet 3   methadone (DOLOPHINE) 10 MG/ML solution Take 75 mg by mouth daily.     methocarbamol (ROBAXIN) 750 MG tablet Take 750 mg by mouth every 6 (six) hours as needed for muscle spasms.     nicotine (NICODERM CQ - DOSED IN MG/24 HOURS) 21 mg/24hr patch Place 1 patch (21 mg total) onto the skin daily. 28 patch 0   nystatin cream (MYCOSTATIN) Apply 1 Application topically 2 (two) times daily. 30 g 0   Omega-3 Fatty Acids (ENTERIC FISH OIL) 1000 MG CPDR Take by mouth. tid     omeprazole (PRILOSEC) 40 MG capsule TAKE 1 CAPSULE BY MOUTH TWICE DAILY. 120 capsule 5   ondansetron (ZOFRAN) 4 MG tablet Take 1 tablet (4 mg total) by mouth every 8 (eight) hours as needed for nausea or vomiting. 20 tablet 1   Oxycodone HCl 10 MG TABS Take 1 tablet (10 mg total) by mouth every 6 (six) hours as needed (pain). 30 tablet 0   potassium chloride (KLOR-CON) 10 MEQ tablet Take 10 mEq by mouth 2 (two) times daily.     predniSONE (DELTASONE) 20 MG tablet Take 1 tablet (20 mg total) by mouth 2 (two) times daily with a meal for 5 days. 10 tablet 0   propranolol (INDERAL) 10 MG tablet Take 1 tablet (10 mg total) by mouth 2 (two) times daily.     QUEtiapine (SEROQUEL) 100 MG tablet Take 1 tablet (100 mg total) by mouth at bedtime. 30 tablet 3   tirzepatide (MOUNJARO) 7.5 MG/0.5ML Pen Inject 7.5 mg into the skin once a week. 6 mL 1   torsemide (DEMADEX) 20 MG tablet Take 2 tablets (40 mg total) by mouth daily.     venlafaxine XR (EFFEXOR XR) 150 MG 24 hr capsule Take 1 capsule (150 mg total) by mouth daily with breakfast. 30 capsule 3   Current Facility-Administered Medications  Medication Dose Route Frequency Provider Last Rate Last  Admin   etonogestrel (NEXPLANON) implant 68 mg  68 mg Subdermal Once Hermina Staggers, MD       methylPREDNISolone acetate (DEPO-MEDROL) injection 40 mg  40 mg Intramuscular Once         Review of Systems: GENERAL: negative for malaise, night sweats HEENT: No changes in hearing or vision, no nose bleeds or other nasal problems. NECK: Negative for lumps, goiter, pain and significant neck swelling RESPIRATORY: Negative for cough, wheezing CARDIOVASCULAR: Negative for chest pain, leg swelling, palpitations, orthopnea GI: SEE HPI MUSCULOSKELETAL: Negative for joint pain or swelling, back pain, and muscle pain. SKIN: Negative for lesions, rash PSYCH: Negative for sleep disturbance, mood disorder and recent psychosocial stressors. HEMATOLOGY Negative for prolonged bleeding, bruising easily, and swollen nodes. ENDOCRINE: Negative for cold or heat intolerance, polyuria, polydipsia and goiter. NEURO: negative for tremor, gait imbalance, syncope and seizures. The remainder of the review of systems is noncontributory.   Physical Exam: BP 107/65 (BP Location: Right Arm, Patient Position: Sitting, Cuff Size: Normal)   Pulse 85   Temp 98.3 F (36.8 C) (Oral)   Ht 5\' 10"  (1.778 m)   Wt 229 lb 1.6 oz (103.9 kg)   LMP 11/15/2023 (Approximate)   BMI 32.87 kg/m  GENERAL: The patient is AO x3, in no acute distress.  Obese HEENT: Head is normocephalic and atraumatic. EOMI are intact. Mouth is well hydrated and without lesions. NECK: Supple. No masses LUNGS: Clear to auscultation. No presence of rhonchi/wheezing/rales. Adequate  chest expansion HEART: RRR, normal s1 and s2. ABDOMEN: Tender to palpation in the upper abdomen and right upper quadrant, no guarding, no peritoneal signs, and nondistended. BS +. No masses. EXTREMITIES: Without any cyanosis, clubbing, rash, lesions or edema. NEUROLOGIC: AOx3, no focal motor deficit. SKIN: no jaundice, no rashes  Imaging/Labs: as above  I personally  reviewed and interpreted the available labs, imaging and endoscopic files.  Impression and Plan: Jaime [name] is a 38 y.o. 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  Regarding the patient's chronic abdominal pain, she has had chronic pain episodes with intermittent issues with constipation in the past.  Constipation has improved but she is still presenting persistent abdominal pain.  We discussed that this could be related to her chronic use of opiates and concomitant use of Mounjaro.  We discussed that one of the possible ways to approach this will be to switch her [facility] for another agent or to minimize use of opiates.  For now, we will follow her CT abdomen and pelvis with IV contrast results.  Will also check a gastric emptying study for now.  Can continue taking Levsin as needed for now.  Endoscopic evaluation of her previous complaints and abdominal pain has been difficult as she developed new DVT requiring anticoagulation.  She is not being followed by any physician regarding this.  It would be important for her to establish with a provider, especially as we will need to obtain clearance to hold this medication for EGD and colonoscopy.  She will be referred to hematology for this.  Finally, her cirrhosis appears to be stable at the moment without any further decompensating events, she should continue torsemide 12 point any significant third spacing.  No signs of hepatic cephalopathy or gastrointestinal bleeding.  She should proceed with previously ordered ultrasound of the abdomen for HCC screening.  - Proceed with liver US -Follow results of CT of the abdomen and pelvis with IV contrast -Continue torsemide 40 mg daily -Continue  hyoscyamine as needed for abdominal pain -Hematology referral -Schedule gastric emptying study, consider decreasing Mounjaro if showing significant delayed gastric emptying - Reduce salt intake to <2 g per day - Can take Tylenol max of 2 g per day (650 mg q8h) for pain - Avoid NSAIDs for pain - Avoid eating raw oysters/shellfish - Protein shake (Ensure or Boost) every night before going to sleep  All questions were answered.      Jaime Blazing, MD Gastroenterology and Hepatology [facility] Gastroenterology

## 2023-12-13 NOTE — Patient Instructions (Addendum)
-   Proceed with liver US  -Follow results of CT of the abdomen and pelvis with IV contrast -Continue torsemide  40 mg daily -Continue hyoscyamine  as needed for abdominal pain -Hematology referral -Schedule gastric emptying study, consider decreasing Mounjaro  if showing significant delayed gastric emptying - Reduce salt intake to <2 g per day - Can take Tylenol  max of 2 g per day (650 mg q8h) for pain - Avoid NSAIDs for pain - Avoid eating raw oysters/shellfish - Protein shake (Ensure or Boost) every night before going to sleep

## 2023-12-15 ENCOUNTER — Telehealth (INDEPENDENT_AMBULATORY_CARE_PROVIDER_SITE_OTHER): Payer: Self-pay

## 2023-12-15 NOTE — Telephone Encounter (Signed)
[name] (Key: [place]) PA Case ID #: [id number] Rx #: [id number] Need Help? Call us at [phone] Outcome N/A on February 11 by [employer] 2017 This request has been closed by the Patient's Pharmacy Benefit Manager. Call Pharmacy Customer Care at [phone]. PA Not Required. Drug Hyoscyamine Sulfate 0.125MG  dispersible tablets ePA cloud logo Form [employer] ePA Form [phone] NCPDP) Original Claim Info 62 Product/Service Not Covered Plan/Benefit Exclusion

## 2023-12-15 NOTE — Telephone Encounter (Signed)
See note under media from plan.

## 2023-12-16 ENCOUNTER — Encounter (HOSPITAL_COMMUNITY)
Admission: RE | Admit: 2023-12-16 | Discharge: 2023-12-16 | Disposition: A | Discharge status: Home / Self Care | Payer: MEDICAID | Source: Ambulatory Visit | Attending: Gastroenterology | Admitting: Gastroenterology

## 2023-12-16 DIAGNOSIS — R112 Nausea with vomiting, unspecified: Secondary | ICD-10-CM | POA: Insufficient documentation

## 2023-12-16 DIAGNOSIS — R109 Unspecified abdominal pain: Secondary | ICD-10-CM | POA: Insufficient documentation

## 2023-12-16 DIAGNOSIS — G8929 Other chronic pain: Secondary | ICD-10-CM | POA: Diagnosis present

## 2023-12-16 MED ORDER — TECHNETIUM TC 99M SULFUR COLLOID
2.0000 | Freq: Once | INTRAVENOUS | Status: AC | PRN
  Administered 2023-12-16: 2.1 via ORAL

## 2023-12-17 ENCOUNTER — Encounter (INDEPENDENT_AMBULATORY_CARE_PROVIDER_SITE_OTHER): Payer: Self-pay

## 2023-12-20 NOTE — Progress Notes (Deleted)
 [facility] [address], [place] [address]   CLINIC:  Medical Oncology/Hematology  PCP:  Rica Records, FNP [phone] [address] [place] [address] [phone]   REASON FOR VISIT:  Follow-up for LLE DVT, PE,, thrombocytopenia (cirrhosis), and iron deficiency  PRIOR THERAPY: IVC filter + therapeutic Lovenox twice daily  CURRENT THERAPY: Eliquis twice daily  INTERVAL HISTORY:   Ms. Distel 38 y.o. female is seen today for follow-up of DVT, thrombocytopenia, and iron deficiency.  She was last seen by Rojelio Brenner PA-C on 09/13/2023.  ***   Since her last visit, she was hospitalized twice: 09/15/2023 through 09/17/2023: CT venogram abdomen/pelvis showed occlusive DVT within LLE with extension of the occlusive thrombus into left external iliac and common iliac veins, with thrombus and IVC filter.  CTA chest showed suboptimal opacification of the pulmonary system, but no definitive acute PE to the segmental level.  Patient s/p mechanical thrombectomy (IVC, left common and external iliac veins, left superficial femoral and deep femoral veins) on 09/16/2023.*** Seen by vascular surgery during hospitalization, and switched from Lovenox to Eliquis at discharge. 09/25/2023 through 09/28/2023: Hospitalized for acute pulmonary embolism.  She reported missing doses of Eliquis on admission.  CTA chest from 09/25/2023 showed acute PE.  Venous Doppler LLE from 09/26/2023 showed occlusive left lower extremity DVT from left femoral vein to popliteal vein.  Patient treated with IV heparin drip, transitioned back to oral anticoagulation with Eliquis on discharge.  ***  At today's visit, she reports feeling ***. *** Eliquis compliance? *** She has not yet had Nexplanon removed by GYN *** *** She has a significant left leg edema and pain related to her acute on chronic DVT. *** She denies any chest pain or hemoptysis. *** She does have some residual shortness of  breath and cough following recent pneumonia. *** Rectal bleeding, melena, menses *** Vascular surgery follow-up was scheduled on 11/19/2023, but patient had to cancel and reschedule due to death in the family, now rescheduled for 12/24/2023.  *** *** Restarted iron tablet?  *** *** She has 20***% energy and 20***% appetite. She endorses that she is maintaining a stable weight.   ASSESSMENT & PLAN:  1.  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  Her mobility was limited due to swelling.  No previous history of DVT/PE. - Lower extremity Doppler (02/04/2023): Positive for DVT within the left popliteal vein.  No evidence of right lower extremity DVT. - Denies any miscarriages.  No personal history of APLS. 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  It was replaced in June 2024.*** - She was started on Lovenox 100 mg twice daily after diagnosed with DVT in April 2024 DOACs were avoided due to patient's liver disease, as they are not recommended in patients with Georgiann Cocker Class C.  Patient was Georgiann Cocker class B as of 07/2021, but had been lost to follow-up with GI with need to reestablish care.  Recommended considering Eliquis or Xarelto in the future, pending liver evaluation. - EGD on 01/19/2023 did not reveal any varices. - Labs from 02/11/2023 did not show evidence of antiphospholipid syndrome. - EXTENSIVE  ACUTE ON CHRONIC LEFT LEG DVT (October 2024): Hospitalized at [facility] from 08/26/2023 through 09/03/2023 due to acute on chronic left lower extremity DVT.  Presented due to increased pain and swelling in left lower  extremity. Patient admitted to stopping Lovenox against medical advice for about 2 months prior to acute on chronic DVT in October/November 2024 LLE duplex showed a totally occluding acute DVT of common femoral, femoral, profunda, and gastrocnemius veins and partially occlusive acute DVT in the popliteal vein. CTA chest PE protocol showed no evidence of PE. CT abdomen/pelvis (08/28/2023) showed possible extension into the left external iliac vein to the level of the common iliac. Hematology was consulted who did not recommend anticoagulation due to her thrombocytopenia (platelets in the 30s during hospitalization). Vascular surgery was consulted who felt patient was a poor candidate for intervention.   IR was consulted and she underwent IVC filter placement on 08/28/2023. Underwent splenic artery embolization on 08/31/2023 due to her thrombocytopenia. - IVC THROMBUS + PULMONARY EMBOLISM 09/15/2023 through 09/17/2023: CT venogram abdomen/pelvis showed occlusive DVT within LLE with extension of the occlusive thrombus into left external iliac and common iliac veins, with thrombus in IVC filter.  CTA chest showed suboptimal opacification of the pulmonary system, but no definitive acute PE to the segmental level.  Patient s/p mechanical thrombectomy (IVC, left common and external iliac veins, left superficial femoral and deep femoral veins) on 09/16/2023.*** Seen by vascular surgery during hospitalization, and switched from Lovenox to Eliquis at discharge.  (NOTE: Although patient reported compliance with Lovenox per hospital H&P, she had reported not taking Lovenox x 2 months as per office visit on 09/13/2023.) 09/25/2023 through 09/28/2023: Hospitalized for acute pulmonary embolism.  She reported missing doses of Eliquis on admission.  CTA chest from 09/25/2023 showed acute PE.  Venous Doppler LLE from 09/26/2023 showed occlusive left lower extremity DVT from left femoral vein to popliteal vein.  Patient  treated with IV heparin drip, transitioned back to oral anticoagulation with Eliquis on discharge.  *** -*** Compliance with Eliquis?  *** - Presents today with severe LLE edema and pain.*** - Labs today *** - Vascular surgery *** - PLAN: Continue Eliquis 5 mg twice daily.  *** Continue close surveillance to ensure platelets >50. - Continue vascular surgery follow-up.  *** - Patient instructed to have Nexplanon removed and to avoid all hormonal birth control.*** While progesterone is less likely to cause DVT than estrogen based contraceptives, Nexplanon and other hormonal contraceptives are contraindicated since she had DVT while she had birth control device in place.*** *** May be a candidate for chronic anticoagulation due to severity of blood clots and chronic risk factors (morbid obesity, poor mobility status, and liver cirrhosis).  *** Birth control??  *** - Healthy weight loss and increased activity level was encouraged - Recommend ongoing analysis of risk/benefit of chronic anticoagulation.  D-dimer and RTC in 6 months *** continue to stressed the importance of COMPLIANCE with all medical recommendations.  ***  2.  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 Dental extractions 1 and half year ago without any excessive bleeding.  Reports prior history of rectal bleeding, but no active bleeding.    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  Cirrhotic changes in the liver. - She follows with Dr. Levon Hedger for cirrhosis and other GI complaints.  She is not  yet on liver transplant list. - Hematology workup (01/14/2023): Negative rheumatoid factor, ANA, lupus anticoagulant. Normal B12, folate, copper, MMA. Immature platelet fraction 7.3%.  Reticulocytes 1.6%.  LDH normal. - S/p partial splenic artery embolization while inpatient at [facility] on 08/31/2023. - CBC today (***): Platelets improved at ***. 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  Less likely would be ITP or bone marrow disorder at this time.  No evidence of nutritional deficiency. - PLAN: Will continue to monitor with periodic CBCs***  3.   Iron deficiency without anemia - Labs from 01/14/2023 show normal Hgb 12.5/MCV 91.2, but with some mild iron deficiency (ferritin 66, iron saturation 11%) - Reports prior history of rectal bleeding, but no active bleeding.  LMP in 2018 (currently has implant) - Colonoscopy (03/31/2022): Preparation of the colon was poor.  Hemorrhoids found on perianal exam. 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  No gastric varices.  5 nonbleeding cratered gastric ulcers with a clear ulcer base in the gastric antrum. - She stopped taking iron tablets several months ago.*** - Denies any evidence of bleeding.*** - Most recent iron panel (09/13/2023): Ferritin 158, iron saturation 11%. - Cystatin C and creatinine were both within normal range when checked in November 2024 - CBC today *** - PLAN: *** TBD *** instructed to increase iron tablet to daily  - We will recheck CBC/D, iron/TIBC, and ferritin in 6 months (May 2025)***  4.   Elevated light chains - Labs from 01/14/2023 showed SPEP normal.  Mildly elevated kappa free light chain 43.6, elevated lambda 34.1.  Normal FLC ratio 1.28. 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  Elevated kappa lambda ratio 2.77. 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  No clear evidence of plasma cell dyscrasia at this time. - PLAN: We will recheck MGUS labs in 1 year (July 2025) along with 24-hour urine.  If no major changes or new abnormalities at that time, no further testing would be needed.***  5.  Social/family history: - She is currently on disability.  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  No history of alcohol use.  Current active smoker half pack to 1 pack of cigarettes per day for 5 years. - No family history of low platelets.  Father had melanoma.  No family history of leukemia.  PLAN SUMMARY: *** TBD *** >> ***     REVIEW OF SYSTEMS: ***  Review of Systems  Constitutional:  Positive for fatigue. Negative for appetite change, chills, diaphoresis, fever and unexpected weight change.  HENT:   Negative for lump/mass and nosebleeds.   Eyes:  Negative for eye problems.  Respiratory:  Positive for cough and shortness of breath (Pneumonia). Negative for hemoptysis.   Cardiovascular:  Positive for leg swelling and palpitations. Negative for chest pain.  Gastrointestinal:  Positive for diarrhea, nausea and vomiting. Negative for abdominal pain, blood in stool and constipation.  Genitourinary:  Negative for hematuria.   Skin: Negative.   Neurological:  Positive for headaches and numbness. Negative for dizziness and light-headedness.  Hematological:  Does not bruise/bleed easily.     PHYSICAL EXAM:  ECOG PERFORMANCE STATUS: 1 - Symptomatic but completely ambulatory *** There were no vitals filed for this visit.  There were no vitals filed for this visit.  Physical Exam Constitutional:      Appearance: Normal appearance. She is morbidly obese.  Cardiovascular:     Heart sounds: Normal heart sounds.  Pulmonary:     Breath sounds: Normal breath sounds.  Musculoskeletal:     Right lower leg: Edema (3+) present.     Left lower leg: Edema (4+) present.   Neurological:     General: No focal deficit present.     Mental Status: Mental status is at baseline.  Psychiatric:        Behavior: Behavior normal. Behavior is cooperative.     PAST MEDICAL/SURGICAL HISTORY:  Past Medical History:  Diagnosis Date   Acute encephalopathy 08/31/2020   Adjustment disorder with depressed mood 09/07/2020   Anxiety    Asthma    Chronic abdominal pain    Chronic back pain    Chronic prescription opiate use 02/16/2022   Cirrhosis (HCC)    Cirrhosis (HCC)    COPD (chronic obstructive pulmonary disease) (HCC)    Depression    Diabetes mellitus without complication (HCC)    Diabetes mellitus, type II (HCC)    Diverticulosis    DVT (deep venous thrombosis) (HCC) 02/04/2023   taking lovenox prior to procedure   Fatty liver 03/07/2020   GERD (gastroesophageal reflux disease)    Hepatitis C    Hernia, abdominal    Hyperglycemia due to type 2 diabetes mellitus (HCC) 05/24/2020   Hyperlipidemia    IBS (irritable bowel syndrome) 02/16/2022   Insomnia    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)    Lupus    Migraine headache    Morbid obesity (HCC) 05/24/2020   Neuropathy    Nocturnal seizures (HCC)    Pain of upper abdomen 07/28/2021   Peptic ulcer    Spleen enlarged    Splenomegaly    Past Surgical History:  Procedure Laterality Date   BIOPSY  11/25/2021   Procedure: BIOPSY;  Surgeon: Dolores Frame, MD;  [facility];  Service: Gastroenterology;;   CHOLECYSTECTOMY     COLONOSCOPY WITH PROPOFOL N/A 11/25/2021   Procedure: COLONOSCOPY WITH PROPOFOL;  Surgeon: Dolores Frame, MD;  Location: [facility];  Service: Gastroenterology;  Laterality: N/A;  805   COLONOSCOPY WITH PROPOFOL N/A 03/31/2022   Procedure: COLONOSCOPY WITH PROPOFOL;  Surgeon: Dolores Frame, MD;  Location: [facility];  Service: Gastroenterology;  Laterality: N/A;  730   ESOPHAGOGASTRODUODENOSCOPY  06/2020    done at [facility], candida in upper esophagus (treated with diflucan), ulcerative esophagitis at GE junction, gastritis in stomach, single ulcer in duodenal bulb, with duodenal mucosa showing no abnormality. No presence of varices   ESOPHAGOGASTRODUODENOSCOPY (EGD) WITH PROPOFOL N/A 11/25/2021   Procedure: ESOPHAGOGASTRODUODENOSCOPY (EGD) WITH PROPOFOL;  Surgeon: Dolores Frame, MD;  [facility]: [facility];  Service: Gastroenterology;  Laterality: N/A;   ESOPHAGOGASTRODUODENOSCOPY (EGD) WITH PROPOFOL N/A 03/31/2022   Procedure: ESOPHAGOGASTRODUODENOSCOPY (EGD) WITH PROPOFOL;  Surgeon: Dolores Frame, MD;  Location: [facility];  Service: Gastroenterology;  Laterality: N/A;   IVC FILTER REMOVAL N/A 09/16/2023   Procedure: IVC FILTER REMOVAL;  Surgeon: [name], MD;  Location: [facility] INVASIVE CV LAB;  Service: Cardiovascular;  Laterality: N/A;   PERIPHERAL VASCULAR THROMBECTOMY Left 09/16/2023  Procedure: PERIPHERAL VASCULAR THROMBECTOMY;  Surgeon: [name], MD;  Location: [facility] INVASIVE CV LAB;  Service: Cardiovascular;  Laterality: Left;    SOCIAL HISTORY:  Social History   Socioeconomic History   Marital status: Married    Spouse name: Not on file   Number of children: Not on file   Years of education: Not on file   Highest education level: Some college, no degree  Occupational History   Not on file  Tobacco Use   Smoking status: Every Day    Current packs/day: 0.50    Types: Cigarettes    Passive exposure: Current   Smokeless tobacco: Never  Vaping Use   Vaping status: Every Day   Substances: Nicotine, Flavoring  Substance and Sexual Activity   Alcohol use: No   Drug use: No   Sexual activity: Yes    Birth control/protection: Implant  Other Topics Concern   Not on file  Social History Narrative   Not on file   Social Drivers of Health   Financial Resource Strain: High Risk (12/09/2023)   Overall Financial Resource Strain (CARDIA)     Difficulty of Paying Living Expenses: Very hard  Food Insecurity: Food Insecurity Present (12/09/2023)   Hunger Vital Sign    Worried About Running Out of Food in the Last Year: Often true    Ran Out of Food in the Last Year: Often true  Transportation Needs: No Transportation Needs (12/09/2023)   PRAPARE - Administrator, Civil Service (Medical): No    Lack of Transportation (Non-Medical): No  Physical Activity: Insufficiently Active (12/09/2023)   Exercise Vital Sign    Days of Exercise per Week: 1 day    Minutes of Exercise per Session: 10 min  Stress: Stress Concern Present (05/09/2020)   [employer] of Occupational Health - Occupational Stress Questionnaire    Feeling of Stress : Very much  Social Connections: Moderately Integrated (12/09/2023)   Social Connection and Isolation Panel [NHANES]    Frequency of Communication with Friends and Family: More than three times a week    Frequency of Social Gatherings with Friends and Family: More than three times a week    Attends Religious Services: 1 to 4 times per year    Active Member of [employer] or Organizations: Yes    Attends Banker Meetings: 1 to 4 times per year    Marital Status: Separated  Intimate Partner Violence: Not At Risk (09/25/2023)   Humiliation, Afraid, Rape, and Kick questionnaire    Fear of Current or Ex-Partner: No    Emotionally Abused: No    Physically Abused: No    Sexually Abused: No    FAMILY HISTORY:  Family History  Problem Relation Age of Onset   Hyperlipidemia Maternal Grandfather     CURRENT MEDICATIONS:  Outpatient Encounter Medications as of 12/21/2023  Medication Sig Note   acetaminophen (TYLENOL) 500 MG tablet Take 1,000 mg by mouth 2 (two) times daily as needed for moderate pain (pain score 4-6) or headache.    albuterol (VENTOLIN HFA) 108 (90 Base) MCG/ACT inhaler Inhale 2 puffs into the lungs every 6 (six) hours as needed for wheezing or shortness of breath.    apixaban  (ELIQUIS) 5 MG TABS tablet Take 2 tablets (10 mg total) by mouth 2 (two) times daily for 6 days, THEN 1 tablet (5 mg total) 2 (two) times daily. 12/13/2023: One bid   ARIPiprazole (ABILIFY) 20 MG tablet Take 1 tablet (20 mg total)  by mouth daily.    clonazePAM (KLONOPIN) 0.5 MG tablet Take 0.5 mg by mouth 2 (two) times daily as needed for anxiety.    Continuous Glucose Sensor (DEXCOM G7 SENSOR) MISC Inject 1 Application into the skin as directed. Change sensor every 10 days as directed.    Continuous Glucose Transmitter (DEXCOM G6 TRANSMITTER) MISC USE AS DIRECTED.    famotidine (PEPCID) 20 MG tablet Take 1 tablet (20 mg total) by mouth at bedtime.    ferrous sulfate 325 (65 FE) MG EC tablet Take 1 tablet (325 mg total) by mouth daily with breakfast.    glucose blood (ACCU-CHEK GUIDE) test strip Use as instructed to check blood glucose four times daily    hydrOXYzine (VISTARIL) 50 MG capsule Take 1 capsule (50 mg total) by mouth 3 (three) times daily as needed.    hyoscyamine (ANASPAZ) 0.125 MG TBDP disintergrating tablet Place 1 tablet (0.125 mg total) under the tongue every 6 (six) hours as needed.    Insulin Pen Needle (PEN NEEDLES) 32G X 6 MM MISC 1 each by Does not apply route 3 (three) times daily.    insulin regular human CONCENTRATED (HUMULIN R U-500 KWIKPEN) 500 UNIT/ML KwikPen Inject 80 Units into the skin 3 (three) times daily with meals. (Patient taking differently: Inject 90 Units into the skin 3 (three) times daily with meals.)    lidocaine (XYLOCAINE) 5 % ointment Apply 1 Application topically as needed. (Patient taking differently: Apply 1 Application topically 2 (two) times daily as needed (pain).)    lisinopril (ZESTRIL) 5 MG tablet Take 5 mg by mouth daily.    metFORMIN (GLUCOPHAGE) 1000 MG tablet Take 1 tablet (1,000 mg total) by mouth 2 (two) times daily with a meal. take 1 tablet by mouth 2 times a day with a meal.    methadone (DOLOPHINE) 10 MG/ML solution Take 75 mg by mouth  daily.    methocarbamol (ROBAXIN) 750 MG tablet Take 750 mg by mouth every 6 (six) hours as needed for muscle spasms.    nicotine (NICODERM CQ - DOSED IN MG/24 HOURS) 21 mg/24hr patch Place 1 patch (21 mg total) onto the skin daily.    nystatin cream (MYCOSTATIN) Apply 1 Application topically 2 (two) times daily.    Omega-3 Fatty Acids (ENTERIC FISH OIL) 1000 MG CPDR Take by mouth. tid    omeprazole (PRILOSEC) 40 MG capsule TAKE 1 CAPSULE BY MOUTH TWICE DAILY.    ondansetron (ZOFRAN) 4 MG tablet Take 1 tablet (4 mg total) by mouth every 8 (eight) hours as needed for nausea or vomiting.    Oxycodone HCl 10 MG TABS Take 1 tablet (10 mg total) by mouth every 6 (six) hours as needed (pain).    potassium chloride (KLOR-CON) 10 MEQ tablet Take 10 mEq by mouth 2 (two) times daily.    predniSONE (DELTASONE) 20 MG tablet Take 1 tablet (20 mg total) by mouth 2 (two) times daily with a meal for 5 days.    propranolol (INDERAL) 10 MG tablet Take 1 tablet (10 mg total) by mouth 2 (two) times daily.    QUEtiapine (SEROQUEL) 100 MG tablet Take 1 tablet (100 mg total) by mouth at bedtime.    tirzepatide (MOUNJARO) 7.5 MG/0.5ML Pen Inject 7.5 mg into the skin once a week.    torsemide (DEMADEX) 20 MG tablet Take 2 tablets (40 mg total) by mouth daily.    venlafaxine XR (EFFEXOR XR) 150 MG 24 hr capsule Take 1 capsule (150 mg total) by  mouth daily with breakfast.    Facility-Administered Encounter Medications as of 12/21/2023  Medication   etonogestrel (NEXPLANON) implant 68 mg   methylPREDNISolone acetate (DEPO-MEDROL) injection 40 mg    ALLERGIES:  Allergies  Allergen Reactions   Firvanq [Vancomycin] Swelling    Facial swelling   Iodine-131 Anaphylaxis, Shortness Of Breath and Swelling   Ivp Dye [Iodinated Contrast Media] Anaphylaxis    Skin redness Difficulty walking   Toradol [Ketorolac Tromethamine] Shortness Of Breath    Flares ulcers   Tylenol [Acetaminophen] Other (See Comments)    Hx of  cirrhosis    Neurontin [Gabapentin] Itching   Nsaids Other (See Comments)    Flares ulcers   Suboxone [Buprenorphine Hcl-Naloxone Hcl] Other (See Comments)    Withdrawal symptoms     LABORATORY DATA:  I have reviewed the labs as listed.  CBC    Component Value Date/Time   WBC 7.5 10/05/2023 1043   WBC 5.2 09/28/2023 0438   RBC 4.25 10/05/2023 1043   RBC 3.56 (L) 09/28/2023 0438   HGB 12.0 10/05/2023 1043   HCT 38.3 10/05/2023 1043   PLT 186 10/05/2023 1043   MCV 90 10/05/2023 1043   MCH 28.2 10/05/2023 1043   MCH 29.2 09/28/2023 0438   MCHC 31.3 (L) 10/05/2023 1043   MCHC 31.4 09/28/2023 0438   RDW 14.4 10/05/2023 1043   LYMPHSABS 1.1 10/05/2023 1043   MONOABS 0.4 09/26/2023 0344   EOSABS 0.2 10/05/2023 1043   BASOSABS 0.1 10/05/2023 1043      Latest Ref Rng & Units 12/08/2023    3:34 PM 10/05/2023   10:43 AM 09/28/2023    4:38 AM  CMP  Glucose 70 - 99 mg/dL  962  952   BUN 6 - 20 mg/dL  7  7   Creatinine 8.41 - 1.00 mg/dL 3.24  4.01  0.27   Sodium 134 - 144 mmol/L  140  137   Potassium 3.5 - 5.2 mmol/L  4.3  4.0   Chloride 96 - 106 mmol/L  102  104   CO2 20 - 29 mmol/L  25  24   Calcium 8.7 - 10.2 mg/dL  8.9  8.1     DIAGNOSTIC IMAGING:  I have independently reviewed the relevant imaging and discussed with the patient.   WRAP UP:  All questions were answered. The patient knows to call the clinic with any problems, questions or concerns.  Medical decision making: High***  Time spent on visit: I spent 30 minutes counseling the patient face to face. The total time spent in the appointment was 45 minutes and more than 50% was on counseling.  Carnella Guadalajara, PA-C  ***

## 2023-12-21 ENCOUNTER — Ambulatory Visit (HOSPITAL_COMMUNITY)
Admission: RE | Admit: 2023-12-21 | Discharge: 2023-12-21 | Disposition: A | Discharge status: Home / Self Care | Payer: MEDICAID | Source: Ambulatory Visit | Attending: Gastroenterology | Admitting: Gastroenterology

## 2023-12-21 ENCOUNTER — Encounter (INDEPENDENT_AMBULATORY_CARE_PROVIDER_SITE_OTHER): Payer: Self-pay

## 2023-12-21 ENCOUNTER — Inpatient Hospital Stay: Payer: MEDICAID | Attending: Oncology | Admitting: Physician Assistant

## 2023-12-21 ENCOUNTER — Inpatient Hospital Stay: Payer: MEDICAID

## 2023-12-21 DIAGNOSIS — K7460 Unspecified cirrhosis of liver: Secondary | ICD-10-CM | POA: Diagnosis present

## 2023-12-23 ENCOUNTER — Institutional Professional Consult (permissible substitution): Payer: MEDICAID | Admitting: Nurse Practitioner

## 2023-12-23 NOTE — Progress Notes (Deleted)
 Patient ID: Jaime [name], female   DOB: 27-Sep-1986, 39 y.o.   MRN: [id number]  Reason for Consult: No chief complaint on file.   Referred by Wylene Men*  Subjective:     HPI  Jaime [name] is a 38 y.o. female presenting for follow-up.  She is about 2 months s/p IVC and left iliofemoral thrombectomy.  Today she reports the left leg is better and her swelling is relatively controlled.  During the case I attempted to remove the IVC filter although this was unsuccessful.  She has remained on anticoagulation.  She is interested in a reattempted IVC filter removal.   Past Medical History:  Diagnosis Date   Acute encephalopathy 08/31/2020   Adjustment disorder with depressed mood 09/07/2020   Anxiety    Asthma    Chronic abdominal pain    Chronic back pain    Chronic prescription opiate use 02/16/2022   Cirrhosis (HCC)    Cirrhosis (HCC)    COPD (chronic obstructive pulmonary disease) (HCC)    Depression    Diabetes mellitus without complication (HCC)    Diabetes mellitus, type II (HCC)    Diverticulosis    DVT (deep venous thrombosis) (HCC) 02/04/2023   taking lovenox prior to procedure   Fatty liver 03/07/2020   GERD (gastroesophageal reflux disease)    Hepatitis C    Hernia, abdominal    Hyperglycemia due to type 2 diabetes mellitus (HCC) 05/24/2020   Hyperlipidemia    IBS (irritable bowel syndrome) 02/16/2022   Insomnia    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)    Lupus    Migraine headache    Morbid obesity (HCC) 05/24/2020   Neuropathy    Nocturnal seizures (HCC)    Pain of upper abdomen 07/28/2021   Peptic ulcer    Spleen enlarged    Splenomegaly    Family History  Problem Relation Age of Onset   Hyperlipidemia Maternal Grandfather    Past Surgical History:  Procedure Laterality Date   BIOPSY  11/25/2021   Procedure: BIOPSY;  Surgeon: Dolores Frame, MD;  [facility];  Service:  Gastroenterology;;   CHOLECYSTECTOMY     COLONOSCOPY WITH PROPOFOL N/A 11/25/2021   Procedure: COLONOSCOPY WITH PROPOFOL;  Surgeon: Dolores Frame, MD;  [facility];  Service: Gastroenterology;  Laterality: N/A;  805   COLONOSCOPY WITH PROPOFOL N/A 03/31/2022   Procedure: COLONOSCOPY WITH PROPOFOL;  Surgeon: Dolores Frame, MD;  [facility];  Service: Gastroenterology;  Laterality: N/A;  730   ESOPHAGOGASTRODUODENOSCOPY  06/2020   done at [facility], candida in upper esophagus (treated with diflucan), ulcerative esophagitis at GE junction, gastritis in stomach, single ulcer in duodenal bulb, with duodenal mucosa showing no abnormality. No presence of varices   ESOPHAGOGASTRODUODENOSCOPY (EGD) WITH PROPOFOL N/A 11/25/2021   Procedure: ESOPHAGOGASTRODUODENOSCOPY (EGD) WITH PROPOFOL;  Surgeon: Dolores Frame, MD;  [facility];  Service: Gastroenterology;  Laterality: N/A;   ESOPHAGOGASTRODUODENOSCOPY (EGD) WITH PROPOFOL N/A 03/31/2022   Procedure: ESOPHAGOGASTRODUODENOSCOPY (EGD) WITH PROPOFOL;  Surgeon: Dolores Frame, MD;  [facility];  Service: Gastroenterology;  Laterality: N/A;   IVC FILTER REMOVAL N/A 09/16/2023   Procedure: IVC FILTER REMOVAL;  Surgeon: [name], MD;  Location: [facility] INVASIVE CV LAB;  Service: Cardiovascular;  Laterality: N/A;   PERIPHERAL VASCULAR THROMBECTOMY Left 09/16/2023   Procedure: PERIPHERAL VASCULAR THROMBECTOMY;  Surgeon: [name], MD;  Location: [facility] INVASIVE CV  LAB;  Service: Cardiovascular;  Laterality: Left;    Short Social History:  Social History   Tobacco Use   Smoking status: Every Day    Current packs/day: 0.50    Types: Cigarettes    Passive exposure: Current   Smokeless tobacco: Never  Substance Use Topics   Alcohol use: No    Allergies  Allergen Reactions   Firvanq [Vancomycin] Swelling    Facial swelling   Iodine-131 Anaphylaxis, Shortness Of  Breath and Swelling   Ivp Dye [Iodinated Contrast Media] Anaphylaxis    Skin redness Difficulty walking   Toradol [Ketorolac Tromethamine] Shortness Of Breath    Flares ulcers   Tylenol [Acetaminophen] Other (See Comments)    Hx of cirrhosis    Neurontin [Gabapentin] Itching   Nsaids Other (See Comments)    Flares ulcers   Suboxone [Buprenorphine Hcl-Naloxone Hcl] Other (See Comments)    Withdrawal symptoms     Current Outpatient Medications  Medication Sig Dispense Refill   acetaminophen (TYLENOL) 500 MG tablet Take 1,000 mg by mouth 2 (two) times daily as needed for moderate pain (pain score 4-6) or headache.     albuterol (VENTOLIN HFA) 108 (90 Base) MCG/ACT inhaler Inhale 2 puffs into the lungs every 6 (six) hours as needed for wheezing or shortness of breath. 8 g 0   apixaban (ELIQUIS) 5 MG TABS tablet Take 2 tablets (10 mg total) by mouth 2 (two) times daily for 6 days, THEN 1 tablet (5 mg total) 2 (two) times daily. 60 tablet 4   ARIPiprazole (ABILIFY) 20 MG tablet Take 1 tablet (20 mg total) by mouth daily. 30 tablet 2   clonazePAM (KLONOPIN) 0.5 MG tablet Take 0.5 mg by mouth 2 (two) times daily as needed for anxiety.     Continuous Glucose Sensor (DEXCOM G7 SENSOR) MISC Inject 1 Application into the skin as directed. Change sensor every 10 days as directed. 9 each 3   Continuous Glucose Transmitter (DEXCOM G6 TRANSMITTER) MISC USE AS DIRECTED. 1 each 1   famotidine (PEPCID) 20 MG tablet Take 1 tablet (20 mg total) by mouth at bedtime.     ferrous sulfate 325 (65 FE) MG EC tablet Take 1 tablet (325 mg total) by mouth daily with breakfast. 90 tablet 3   glucose blood (ACCU-CHEK GUIDE) test strip Use as instructed to check blood glucose four times daily 150 each 5   hydrOXYzine (VISTARIL) 50 MG capsule Take 1 capsule (50 mg total) by mouth 3 (three) times daily as needed. 30 capsule 2   hyoscyamine (ANASPAZ) 0.125 MG TBDP disintergrating tablet Place 1 tablet (0.125 mg total)  under the tongue every 6 (six) hours as needed. 180 tablet 1   Insulin Pen Needle (PEN NEEDLES) 32G X 6 MM MISC 1 each by Does not apply route 3 (three) times daily. 100 each 3   insulin regular human CONCENTRATED (HUMULIN R U-500 KWIKPEN) 500 UNIT/ML KwikPen Inject 80 Units into the skin 3 (three) times daily with meals. (Patient taking differently: Inject 90 Units into the skin 3 (three) times daily with meals.) 45 mL 3   lidocaine (XYLOCAINE) 5 % ointment Apply 1 Application topically as needed. (Patient taking differently: Apply 1 Application topically 2 (two) times daily as needed (pain).) 35.44 g 5   lisinopril (ZESTRIL) 5 MG tablet Take 5 mg by mouth daily.     metFORMIN (GLUCOPHAGE) 1000 MG tablet Take 1 tablet (1,000 mg total) by mouth 2 (two) times daily with a meal. take 1  tablet by mouth 2 times a day with a meal. 180 tablet 3   methadone (DOLOPHINE) 10 MG/ML solution Take 75 mg by mouth daily.     methocarbamol (ROBAXIN) 750 MG tablet Take 750 mg by mouth every 6 (six) hours as needed for muscle spasms.     nicotine (NICODERM CQ - DOSED IN MG/24 HOURS) 21 mg/24hr patch Place 1 patch (21 mg total) onto the skin daily. 28 patch 0   nystatin cream (MYCOSTATIN) Apply 1 Application topically 2 (two) times daily. 30 g 0   Omega-3 Fatty Acids (ENTERIC FISH OIL) 1000 MG CPDR Take by mouth. tid     omeprazole (PRILOSEC) 40 MG capsule TAKE 1 CAPSULE BY MOUTH TWICE DAILY. 120 capsule 5   ondansetron (ZOFRAN) 4 MG tablet Take 1 tablet (4 mg total) by mouth every 8 (eight) hours as needed for nausea or vomiting. 20 tablet 1   Oxycodone HCl 10 MG TABS Take 1 tablet (10 mg total) by mouth every 6 (six) hours as needed (pain). 30 tablet 0   potassium chloride (KLOR-CON) 10 MEQ tablet Take 10 mEq by mouth 2 (two) times daily.     predniSONE (DELTASONE) 20 MG tablet Take 1 tablet (20 mg total) by mouth 2 (two) times daily with a meal for 5 days. 10 tablet 0   propranolol (INDERAL) 10 MG tablet Take 1  tablet (10 mg total) by mouth 2 (two) times daily.     QUEtiapine (SEROQUEL) 100 MG tablet Take 1 tablet (100 mg total) by mouth at bedtime. 30 tablet 3   tirzepatide (MOUNJARO) 7.5 MG/0.5ML Pen Inject 7.5 mg into the skin once a week. 6 mL 1   torsemide (DEMADEX) 20 MG tablet Take 2 tablets (40 mg total) by mouth daily.     venlafaxine XR (EFFEXOR XR) 150 MG 24 hr capsule Take 1 capsule (150 mg total) by mouth daily with breakfast. 30 capsule 3   Current Facility-Administered Medications  Medication Dose Route Frequency Provider Last Rate Last Admin   etonogestrel (NEXPLANON) implant 68 mg  68 mg Subdermal Once Hermina Staggers, MD       methylPREDNISolone acetate (DEPO-MEDROL) injection 40 mg  40 mg Intramuscular Once         REVIEW OF SYSTEMS  Positive for ***  All other systems were reviewed and are negative     Objective:  Objective   There were no vitals filed for this visit. There is no height or weight on file to calculate BMI.  Physical Exam General: no acute distress Cardiac: hemodynamically stable, nontachycardic Pulm: normal work of breathing GI: non-tender, no pulsatile mass*** Neuro: alert, no focal deficit Extremities: no edema, cyanosis or wounds*** Vascular:              Right: ***             Left: ***     Data: Aortoiliac duplex ***   DVT ultrasound ***       Assessment/Plan:     Jaime [name] is a 38 y.o. female who is 2 months s/p IVC and left iliofemoral thrombectomy with unsuccessful removal of IVC filter.  Her left leg is doing much better and the duplex demonstrates a patent iliac vein.  We discussed the risk and benefits of the second attempt at IVC filter removal.  She expressed understanding and is willing to proceed.   Will schedule for IVC filter removal in the Cath Lab ***    [name]  MD [facility]

## 2023-12-24 ENCOUNTER — Ambulatory Visit: Payer: MEDICAID | Admitting: Vascular Surgery

## 2023-12-24 ENCOUNTER — Ambulatory Visit (HOSPITAL_COMMUNITY): Payer: MEDICAID | Attending: Vascular Surgery

## 2023-12-27 ENCOUNTER — Encounter: Payer: Self-pay | Admitting: Nurse Practitioner

## 2023-12-28 ENCOUNTER — Encounter (INDEPENDENT_AMBULATORY_CARE_PROVIDER_SITE_OTHER): Payer: Self-pay

## 2023-12-28 NOTE — Therapy (Incomplete)
OUTPATIENT PHYSICAL THERAPY THORACOLUMBAR EVALUATION   Patient Name: Jaime [name] MRN: [id number] DOB:24-May-1986, 38 y.o., female Today's Date: 12/28/2023  END OF SESSION:   Past Medical History:  Diagnosis Date   Acute encephalopathy 08/31/2020   Adjustment disorder with depressed mood 09/07/2020   Anxiety    Asthma    Chronic abdominal pain    Chronic back pain    Chronic prescription opiate use 02/16/2022   Cirrhosis (HCC)    Cirrhosis (HCC)    COPD (chronic obstructive pulmonary disease) (HCC)    Depression    Diabetes mellitus without complication (HCC)    Diabetes mellitus, type II (HCC)    Diverticulosis    DVT (deep venous thrombosis) (HCC) 02/04/2023   taking lovenox prior to procedure   Fatty liver 03/07/2020   GERD (gastroesophageal reflux disease)    Hepatitis C    Hernia, abdominal    Hyperglycemia due to type 2 diabetes mellitus (HCC) 05/24/2020   Hyperlipidemia    IBS (irritable bowel syndrome) 02/16/2022   Insomnia    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)    Lupus    Migraine headache    Morbid obesity (HCC) 05/24/2020   Neuropathy    Nocturnal seizures (HCC)    Pain of upper abdomen 07/28/2021   Peptic ulcer    Spleen enlarged    Splenomegaly    Past Surgical History:  Procedure Laterality Date   BIOPSY  11/25/2021   Procedure: BIOPSY;  Surgeon: Dolores Frame, MD;  [facility];  Service: Gastroenterology;;   CHOLECYSTECTOMY     COLONOSCOPY WITH PROPOFOL N/A 11/25/2021   Procedure: COLONOSCOPY WITH PROPOFOL;  Surgeon: Dolores Frame, MD;  [facility];  Service: Gastroenterology;  Laterality: N/A;  805   COLONOSCOPY WITH PROPOFOL N/A 03/31/2022   Procedure: COLONOSCOPY WITH PROPOFOL;  Surgeon: Dolores Frame, MD;  [facility];  Service: Gastroenterology;  Laterality: N/A;  730   ESOPHAGOGASTRODUODENOSCOPY  06/2020   done at [facility], candida in upper  esophagus (treated with diflucan), ulcerative esophagitis at GE junction, gastritis in stomach, single ulcer in duodenal bulb, with duodenal mucosa showing no abnormality. No presence of varices   ESOPHAGOGASTRODUODENOSCOPY (EGD) WITH PROPOFOL N/A 11/25/2021   Procedure: ESOPHAGOGASTRODUODENOSCOPY (EGD) WITH PROPOFOL;  Surgeon: Dolores Frame, MD;  [facility];  Service: Gastroenterology;  Laterality: N/A;   ESOPHAGOGASTRODUODENOSCOPY (EGD) WITH PROPOFOL N/A 03/31/2022   Procedure: ESOPHAGOGASTRODUODENOSCOPY (EGD) WITH PROPOFOL;  Surgeon: Dolores Frame, MD;  [facility];  Service: Gastroenterology;  Laterality: N/A;   IVC FILTER REMOVAL N/A 09/16/2023   Procedure: IVC FILTER REMOVAL;  Surgeon: [name], MD;  Location: [facility] INVASIVE CV LAB;  Service: Cardiovascular;  Laterality: N/A;   PERIPHERAL VASCULAR THROMBECTOMY Left 09/16/2023   Procedure: PERIPHERAL VASCULAR THROMBECTOMY;  Surgeon: [name], MD;  Location: [facility] INVASIVE CV LAB;  Service: Cardiovascular;  Laterality: Left;   Patient Active Problem List   Diagnosis Date Noted   Lumbar pain 12/09/2023   Cellulitis and abscess of buttock 10/22/2023   Type 2 diabetes mellitus with other specified complication (HCC) 10/22/2023   Encounter for IUD insertion 10/06/2023   Hospital discharge follow-up 10/05/2023   Depression, major, single episode, severe (HCC) 10/05/2023   Acute pulmonary embolism (HCC) 09/25/2023   Elevated d-dimer 09/16/2023   Elevated brain natriuretic peptide (BNP) level 09/16/2023   Hypoalbuminemia due to protein-calorie malnutrition (HCC) 09/16/2023   Acute deep vein thrombosis (DVT)  of left lower extremity (HCC) 09/15/2023   Left leg cellulitis 09/14/2023   Deep vein thrombophlebitis of left leg (HCC) 08/26/2023   Lumbar compression fracture (HCC) 02/10/2023   Left leg DVT (HCC) 02/05/2023   At risk for arrhythmia 01/26/2023   Chronic back pain 01/01/2023    Insomnia 01/01/2023   Depression, recurrent (HCC) 01/01/2023   Constipation 05/18/2022   IBS (irritable bowel syndrome) 02/16/2022   Chronic prescription opiate use 02/16/2022   Gastroesophageal reflux disease 10/23/2021   Nausea and vomiting 10/23/2021   Diarrhea 10/23/2021   Pain of upper abdomen 07/28/2021   Adjustment disorder with depressed mood 09/07/2020   Dyspnea 07/29/2020   Anasarca 05/24/2020   Hyperglycemia due to type 2 diabetes mellitus (HCC) 05/24/2020   Morbid obesity (HCC) 05/24/2020   Elevated serum immunoglobulin free light chains 03/08/2020   Fatty liver 03/07/2020   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   Splenomegaly 03/07/2020   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   Pressure injury of skin 01/29/2019   Uncontrolled diabetes mellitus 09/19/2018   Hyperglycemia 09/17/2018   Acute respiratory failure with hypoxia (HCC) 09/15/2018   Anxiety 09/15/2018   Chronic abdominal pain 09/15/2018   Diabetes mellitus without complication (HCC) 09/15/2018   Hepatitis C 09/15/2018   Peptic ulcer 09/15/2018   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) 09/15/2018   Cirrhosis (HCC) 09/15/2018   Tobacco use disorder 05/22/2018   CAP (community acquired pneumonia) 07/17/2016   Chronic pain disorder 07/17/2016   Depression 05/24/2013    PCP: ***  REFERRING PROVIDER: ***  REFERRING DIAG: ***  Rationale for Evaluation and Treatment: Rehabilitation  THERAPY DIAG:  No diagnosis found.  ONSET DATE: ***  SUBJECTIVE:                                                                                                                                                                                           SUBJECTIVE STATEMENT: ***  PERTINENT HISTORY:  ***  PAIN:  Are you having pain? {OPRCPAIN:27236}  PRECAUTIONS: {Therapy precautions:24002}  RED FLAGS: {PT Red Flags:29287}   WEIGHT BEARING RESTRICTIONS: {Yes ***/No:24003}  FALLS:   Has patient fallen in last 6 months? {fallsyesno:27318}  LIVING ENVIRONMENT: Lives with: {OPRC lives with:25569::"lives with their family"} Lives in: {Lives in:25570} Stairs: {opstairs:27293} Has following equipment at home: {Assistive devices:23999}  OCCUPATION: ***  PLOF: {PLOF:24004}  PATIENT GOALS: ***  NEXT MD VISIT: ***  OBJECTIVE:  Note: Objective measures were completed at Evaluation unless otherwise noted.  DIAGNOSTIC FINDINGS:  ***  PATIENT SURVEYS:  {rehab surveys:24030}  COGNITION: Overall cognitive status: {cognition:24006}     SENSATION: {sensation:27233}  MUSCLE LENGTH:  Hamstrings: Right *** deg; Left *** deg Maisie Fus test: Right *** deg; Left *** deg  POSTURE: {posture:25561}  PALPATION: ***  LUMBAR ROM:   AROM eval  Flexion   Extension   Right lateral flexion   Left lateral flexion   Right rotation   Left rotation    (Blank rows = not tested)  LOWER EXTREMITY ROM:     {AROM/PROM:27142}  Right eval Left eval  Hip flexion    Hip extension    Hip abduction    Hip adduction    Hip internal rotation    Hip external rotation    Knee flexion    Knee extension    Ankle dorsiflexion    Ankle plantarflexion    Ankle inversion    Ankle eversion     (Blank rows = not tested)  LOWER EXTREMITY MMT:    MMT Right eval Left eval  Hip flexion    Hip extension    Hip abduction    Hip adduction    Hip internal rotation    Hip external rotation    Knee flexion    Knee extension    Ankle dorsiflexion    Ankle plantarflexion    Ankle inversion    Ankle eversion     (Blank rows = not tested)  LUMBAR SPECIAL TESTS:  {lumbar special test:25242}  FUNCTIONAL TESTS:  {Functional tests:24029}  GAIT: Distance walked: *** Assistive device utilized: {Assistive devices:23999} Level of assistance: {Levels of assistance:24026} Comments: ***  TREATMENT DATE: 12/28/23 physical therapy evaluation and HEP instruction                                                                                                                                  PATIENT EDUCATION:  Education details: Patient educated on exam findings, POC, scope of PT, HEP, and ***. Person educated: Patient Education method: Explanation, Demonstration, and Handouts Education comprehension: verbalized understanding, returned demonstration, verbal cues required, and tactile cues required  HOME EXERCISE PROGRAM: ***  ASSESSMENT:  CLINICAL IMPRESSION: Patient is a 38 y.o. female who was seen today for physical therapy evaluation and treatment for Continued lumbar pain  Recent xray showed Mild disc space narrowing and degenerative change L1-L2, L2-L3 and L4-L5.Marland Kitchen   OBJECTIVE IMPAIRMENTS: {opptimpairments:25111}.   ACTIVITY LIMITATIONS: {activitylimitations:27494}  PARTICIPATION LIMITATIONS: {participationrestrictions:25113}  PERSONAL FACTORS: {Personal factors:25162} are also affecting patient's functional outcome.   REHAB POTENTIAL: Good  CLINICAL DECISION MAKING: Evolving/moderate complexity  EVALUATION COMPLEXITY: Moderate   GOALS: Goals reviewed with patient? No  SHORT TERM GOALS: Target date: ***  *** Baseline: Goal status: INITIAL  2.  *** Baseline:  Goal status: INITIAL  3.  *** Baseline:  Goal status: INITIAL  4.  *** Baseline:  Goal status: INITIAL  5.  *** Baseline:  Goal status: INITIAL  6.  *** Baseline:  Goal status: INITIAL  LONG TERM GOALS: Target date: ***  *** Baseline:  Goal status: INITIAL  2.  ***  Baseline:  Goal status: INITIAL  3.  *** Baseline:  Goal status: INITIAL  4.  *** Baseline:  Goal status: INITIAL  5.  *** Baseline:  Goal status: INITIAL  6.  *** Baseline:  Goal status: INITIAL  PLAN:  PT FREQUENCY: {rehab frequency:25116}  PT DURATION: {rehab duration:25117}  PLANNED INTERVENTIONS: 97164- PT Re-evaluation, 97110-Therapeutic exercises, 97530- Therapeutic activity,  97112- Neuromuscular re-education, 97535- Self Care, 86578- Manual therapy, [id number]- Gait training, [phone]- Orthotic Fit/training, [phone]- Canalith repositioning, [id number]- Aquatic Therapy, [phone]- Splinting, Patient/Family education, Balance training, Stair training, Taping, Dry Needling, Joint mobilization, Joint manipulation, Spinal manipulation, Spinal mobilization, Scar mobilization, and DME instructions. Marland Kitchen  PLAN FOR NEXT SESSION: Review HEP and goals   3:34 PM, 12/28/23 Jahaan Vanwagner Small Anshu Wehner MPT [place] [place] [phone]

## 2023-12-29 ENCOUNTER — Ambulatory Visit (HOSPITAL_COMMUNITY): Payer: MEDICAID

## 2024-01-06 NOTE — Progress Notes (Signed)
 Patient ID: Jaime [name], female   DOB: [birth year], 38 y.o.   MRN: [id number]  Reason for Consult: Routine Post Op   Referred by Wylene Men*  Subjective:     HPI  Jaime [name] is a 38 y.o. female with a history of DVT who presents for follow-up.  She had an IVC filter placed when she was diagnosed with DVT of the left lower extremity due to inability to be anticoagulated due to thrombocytopenia.  His platelet count had improved and she was started on a DOAC.  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  Today she reports the left leg is less swollen but she is still having pain.  She is not currently in compression.  Past Medical History:  Diagnosis Date   Acute encephalopathy 08/31/2020   Adjustment disorder with depressed mood 09/07/2020   Anxiety    Asthma    Chronic abdominal pain    Chronic back pain    Chronic prescription opiate use 02/16/2022   Cirrhosis (HCC)    Cirrhosis (HCC)    COPD (chronic obstructive pulmonary disease) (HCC)    Depression    Diabetes mellitus without complication (HCC)    Diabetes mellitus, type II (HCC)    Diverticulosis    DVT (deep venous thrombosis) (HCC) 02/04/2023   taking lovenox prior to procedure   Fatty liver 03/07/2020   GERD (gastroesophageal reflux disease)    Hepatitis C    Hernia, abdominal    Hyperglycemia due to type 2 diabetes mellitus (HCC) 05/24/2020   Hyperlipidemia    IBS (irritable bowel syndrome) 02/16/2022   Insomnia    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)    Lupus    Migraine headache    Morbid obesity (HCC) 05/24/2020   Neuropathy    Nocturnal seizures (HCC)    Pain of upper abdomen 07/28/2021   Peptic ulcer    Spleen enlarged    Splenomegaly    Family History  Problem Relation Age of Onset    Hyperlipidemia Maternal Grandfather    Past Surgical History:  Procedure Laterality Date   BIOPSY  11/25/2021   Procedure: BIOPSY;  Surgeon: Dolores Frame, MD;  [facility];  Service: Gastroenterology;;   CHOLECYSTECTOMY     COLONOSCOPY WITH PROPOFOL N/A 11/25/2021   Procedure: COLONOSCOPY WITH PROPOFOL;  Surgeon: Dolores Frame, MD;  [facility]: [facility];  Service: Gastroenterology;  Laterality: N/A;  805   COLONOSCOPY WITH PROPOFOL N/A 03/31/2022   Procedure: COLONOSCOPY WITH PROPOFOL;  Surgeon: Dolores Frame, MD;  [facility]: [facility];  Service: Gastroenterology;  Laterality: N/A;  730   ESOPHAGOGASTRODUODENOSCOPY  06/2020   done at [facility], candida in upper esophagus (treated with diflucan), ulcerative esophagitis at GE junction, gastritis in stomach, single ulcer in duodenal bulb, with duodenal mucosa showing no abnormality. No presence of varices   ESOPHAGOGASTRODUODENOSCOPY (EGD) WITH PROPOFOL N/A 11/25/2021   Procedure: ESOPHAGOGASTRODUODENOSCOPY (EGD) WITH PROPOFOL;  Surgeon: Dolores Frame, MD;  [facility]: [facility];  Service: Gastroenterology;  Laterality: N/A;   ESOPHAGOGASTRODUODENOSCOPY (EGD) WITH PROPOFOL N/A 03/31/2022   Procedure: ESOPHAGOGASTRODUODENOSCOPY (EGD) WITH PROPOFOL;  Surgeon: Dolores Frame, MD;  [facility]: [facility];  Service: Gastroenterology;  Laterality: N/A;   IVC FILTER REMOVAL N/A 09/16/2023  Procedure: IVC FILTER REMOVAL;  Surgeon: [name], MD;  Location: [facility] INVASIVE CV LAB;  Service: Cardiovascular;  Laterality: N/A;   PERIPHERAL VASCULAR THROMBECTOMY Left 09/16/2023   Procedure: PERIPHERAL VASCULAR THROMBECTOMY;  Surgeon: [name], MD;  Location: [facility] INVASIVE CV LAB;  Service: Cardiovascular;  Laterality: Left;    Short Social History:  Social History   Tobacco Use   Smoking status: Every Day    Current packs/day: 0.50    Types: Cigarettes    Passive  exposure: Current   Smokeless tobacco: Never  Substance Use Topics   Alcohol use: No    Allergies  Allergen Reactions   Firvanq [Vancomycin] Swelling    Facial swelling   Iodine-131 Anaphylaxis, Shortness Of Breath and Swelling   Ivp Dye [Iodinated Contrast Media] Anaphylaxis    Skin redness Difficulty walking   Toradol [Ketorolac Tromethamine] Shortness Of Breath    Flares ulcers   Tylenol [Acetaminophen] Other (See Comments)    Hx of cirrhosis    Neurontin [Gabapentin] Itching   Nsaids Other (See Comments)    Flares ulcers   Suboxone [Buprenorphine Hcl-Naloxone Hcl] Other (See Comments)    Withdrawal symptoms     Current Outpatient Medications  Medication Sig Dispense Refill   acetaminophen (TYLENOL) 500 MG tablet Take 1,000 mg by mouth 2 (two) times daily as needed for moderate pain (pain score 4-6) or headache.     albuterol (VENTOLIN HFA) 108 (90 Base) MCG/ACT inhaler Inhale 2 puffs into the lungs every 6 (six) hours as needed for wheezing or shortness of breath. 8 g 0   apixaban (ELIQUIS) 5 MG TABS tablet Take 2 tablets (10 mg total) by mouth 2 (two) times daily for 6 days, THEN 1 tablet (5 mg total) 2 (two) times daily. 60 tablet 4   ARIPiprazole (ABILIFY) 20 MG tablet Take 1 tablet (20 mg total) by mouth daily. 30 tablet 2   clonazePAM (KLONOPIN) 0.5 MG tablet Take 0.5 mg by mouth 2 (two) times daily as needed for anxiety.     Continuous Glucose Sensor (DEXCOM G7 SENSOR) MISC Inject 1 Application into the skin as directed. Change sensor every 10 days as directed. 9 each 3   Continuous Glucose Transmitter (DEXCOM G6 TRANSMITTER) MISC USE AS DIRECTED. 1 each 1   famotidine (PEPCID) 20 MG tablet Take 1 tablet (20 mg total) by mouth at bedtime.     ferrous sulfate 325 (65 FE) MG EC tablet Take 1 tablet (325 mg total) by mouth daily with breakfast. 90 tablet 3   glucose blood (ACCU-CHEK GUIDE) test strip Use as instructed to check blood glucose four times daily 150 each 5    hydrOXYzine (VISTARIL) 50 MG capsule Take 1 capsule (50 mg total) by mouth 3 (three) times daily as needed. 30 capsule 2   hyoscyamine (ANASPAZ) 0.125 MG TBDP disintergrating tablet Place 1 tablet (0.125 mg total) under the tongue every 6 (six) hours as needed. 180 tablet 1   Insulin Pen Needle (PEN NEEDLES) 32G X 6 MM MISC 1 each by Does not apply route 3 (three) times daily. 100 each 3   insulin regular human CONCENTRATED (HUMULIN R U-500 KWIKPEN) 500 UNIT/ML KwikPen Inject 80 Units into the skin 3 (three) times daily with meals. (Patient taking differently: Inject 90 Units into the skin 3 (three) times daily with meals.) 45 mL 3   lidocaine (XYLOCAINE) 5 % ointment Apply 1 Application topically as needed. (Patient taking differently: Apply 1 Application topically 2 (two) times  daily as needed (pain).) 35.44 g 5   lisinopril (ZESTRIL) 5 MG tablet Take 5 mg by mouth daily.     metFORMIN (GLUCOPHAGE) 1000 MG tablet Take 1 tablet (1,000 mg total) by mouth 2 (two) times daily with a meal. take 1 tablet by mouth 2 times a day with a meal. 180 tablet 3   methadone (DOLOPHINE) 10 MG/ML solution Take 75 mg by mouth daily.     methocarbamol (ROBAXIN) 750 MG tablet Take 750 mg by mouth every 6 (six) hours as needed for muscle spasms.     nicotine (NICODERM CQ - DOSED IN MG/24 HOURS) 21 mg/24hr patch Place 1 patch (21 mg total) onto the skin daily. 28 patch 0   nystatin cream (MYCOSTATIN) Apply 1 Application topically 2 (two) times daily. 30 g 0   Omega-3 Fatty Acids (ENTERIC FISH OIL) 1000 MG CPDR Take by mouth. tid     omeprazole (PRILOSEC) 40 MG capsule TAKE 1 CAPSULE BY MOUTH TWICE DAILY. 120 capsule 5   ondansetron (ZOFRAN) 4 MG tablet Take 1 tablet (4 mg total) by mouth every 8 (eight) hours as needed for nausea or vomiting. 20 tablet 1   Oxycodone HCl 10 MG TABS Take 1 tablet (10 mg total) by mouth every 6 (six) hours as needed (pain). 30 tablet 0   pantoprazole (PROTONIX) 20 MG tablet Take 20 mg by  mouth daily.     potassium chloride (KLOR-CON) 10 MEQ tablet Take 10 mEq by mouth 2 (two) times daily.     propranolol (INDERAL) 10 MG tablet Take 1 tablet (10 mg total) by mouth 2 (two) times daily.     QUEtiapine (SEROQUEL) 100 MG tablet Take 1 tablet (100 mg total) by mouth at bedtime. 30 tablet 3   tirzepatide (MOUNJARO) 7.5 MG/0.5ML Pen Inject 7.5 mg into the skin once a week. 6 mL 1   torsemide (DEMADEX) 20 MG tablet Take 2 tablets (40 mg total) by mouth daily.     venlafaxine XR (EFFEXOR XR) 150 MG 24 hr capsule Take 1 capsule (150 mg total) by mouth daily with breakfast. 30 capsule 3   predniSONE (DELTASONE) 20 MG tablet Take 1 tablet (20 mg total) by mouth 2 (two) times daily with a meal for 5 days. 10 tablet 0   Current Facility-Administered Medications  Medication Dose Route Frequency Provider Last Rate Last Admin   etonogestrel (NEXPLANON) implant 68 mg  68 mg Subdermal Once Hermina Staggers, MD       methylPREDNISolone acetate (DEPO-MEDROL) injection 40 mg  40 mg Intramuscular Once         REVIEW OF SYSTEMS   All other systems were reviewed and are negative     Objective:  Objective   Vitals:   01/07/24 0930  BP: 129/80  Pulse: 96  Temp: 98.4 F (36.9 C)  SpO2: 97%  Weight: 241 lb (109.3 kg)  Height: 5\' 10"  (1.778 m)   Body mass index is 34.58 kg/m.  Physical Exam General: no acute distress Cardiac: hemodynamically stable Pulm: normal work of breathing Extremities: Minimal edema of the left lower leg from ankle to mid shin.  Data: IVC/Iliac Findings:  +----------+------+--------+--------+    IVC    PatentThrombusComments  +----------+------+--------+--------+  IVC Prox                NV        +----------+------+--------+--------+  IVC Mid  NV        +----------+------+--------+--------+  IVC Distal              NV        +----------+------+--------+--------+      +-------------------+---------+-----------+---------+-----------+--------+         CIV        RT-PatentRT-ThrombusLT-PatentLT-ThrombusComments  +-------------------+---------+-----------+---------+-----------+--------+  Common Iliac Prox                                             NV     +-------------------+---------+-----------+---------+-----------+--------+  Common Iliac Mid                                              NV     +-------------------+---------+-----------+---------+-----------+--------+  Common Iliac Distal                                           NV     +-------------------+---------+-----------+---------+-----------+--------+      +-----------------+---------+-----------+---------+-----------+------------  ---+        EIV       RT-PatentRT-ThrombusLT-PatentLT-Thrombus   Comments       +-----------------+---------+-----------+---------+-----------+------------  ---+  External Iliac                        patent                Residual       Vein Distal                                                  chronic                                                                thrombus in  the                                                          distal  segment                                                              of the  EIV,    +-----------------+---------+-----------+---------+-----------+------------  ---+   Left DVT study +---------+---------------+---------+-----------+----------+---------------  ----+  LEFT    CompressibilityPhasicitySpontaneityPropertiesThrombus Aging        +---------+---------------+---------+-----------+----------+---------------  ----+  CFV     Partial        No  No                                         +---------+---------------+---------+-----------+----------+---------------  ----+  SFJ     Full                                                                +---------+---------------+---------+-----------+----------+---------------  ----+  FV Prox  Partial        No       No                                         +---------+---------------+---------+-----------+----------+---------------  ----+  FV Mid   None           No       No                                         +---------+---------------+---------+-----------+----------+---------------  ----+  FV DistalNone           No       No                                         +---------+---------------+---------+-----------+----------+---------------  ----+  PFV                                                  Not well  visualized  +---------+---------------+---------+-----------+----------+---------------  ----+  POP     None           No       No                                         +---------+---------------+---------+-----------+----------+---------------  ----+  PTV     Full           Yes      Yes                                        +---------+---------------+---------+-----------+----------+---------------  ----+  PERO                                                 Not well  visualized  +---------+---------------+---------+-----------+----------+---------------  ----+  Gastroc  Not well  visualized  +---------+---------------+---------+-----------+----------+---------------  ----+  GSV     Full           Yes      Yes                                        +---------+---------------+---------+-----------+----------+---------------  ----+  SSV     Full           Yes      Yes                                        +---------+---------------+---------+-----------+----------+---------------  ----+       Assessment/Plan:     AUSTINE KELSAY is a 38 y.o. female with history of left DVT status post thrombectomy,  still with an IVC filter in place.  I recommended IVC filter removal discussed the risk and benefits and she elected to proceed.    Will plan to schedule for 3/17, explained she will have to hold her Eliquis for 2 days prior to the procedure. 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   Recommendations to optimize cardiovascular risk: Abstinence from all tobacco products. Blood glucose control with goal A1c < 7%. Blood pressure control with goal blood pressure < 140/90 mmHg. Lipid reduction therapy with goal LDL-C <100 mg/dL  Aspirin 81mg  PO QD.  Atorvastatin 40-80mg  PO QD (or other "high intensity" statin therapy).     [name] MD [facility]

## 2024-01-07 ENCOUNTER — Other Ambulatory Visit: Payer: Self-pay

## 2024-01-07 ENCOUNTER — Ambulatory Visit (INDEPENDENT_AMBULATORY_CARE_PROVIDER_SITE_OTHER): Payer: MEDICAID | Admitting: Vascular Surgery

## 2024-01-07 ENCOUNTER — Ambulatory Visit (INDEPENDENT_AMBULATORY_CARE_PROVIDER_SITE_OTHER)
Admission: RE | Admit: 2024-01-07 | Discharge: 2024-01-07 | Disposition: A | Discharge status: Home / Self Care | Payer: MEDICAID | Source: Ambulatory Visit | Attending: Vascular Surgery | Admitting: Vascular Surgery

## 2024-01-07 ENCOUNTER — Telehealth: Payer: Self-pay

## 2024-01-07 ENCOUNTER — Ambulatory Visit (HOSPITAL_COMMUNITY)
Admission: RE | Admit: 2024-01-07 | Discharge: 2024-01-07 | Disposition: A | Discharge status: Home / Self Care | Payer: MEDICAID | Source: Ambulatory Visit | Attending: Vascular Surgery | Admitting: Vascular Surgery

## 2024-01-07 ENCOUNTER — Encounter: Payer: Self-pay | Admitting: Vascular Surgery

## 2024-01-07 VITALS — BP 129/80 | HR 96 | Temp 98.4°F | Ht 70.0 in | Wt 241.0 lb

## 2024-01-07 DIAGNOSIS — I82512 Chronic embolism and thrombosis of left femoral vein: Secondary | ICD-10-CM

## 2024-01-07 DIAGNOSIS — I824Y2 Acute embolism and thrombosis of unspecified deep veins of left proximal lower extremity: Secondary | ICD-10-CM | POA: Diagnosis present

## 2024-01-07 NOTE — Telephone Encounter (Signed)
 Returned patient call re: her request to move scheduled appt time from 3/17 to 4/15.  Informed via VM that Dr Hetty Blend performs IVC removal procedures on Mondays only.

## 2024-01-10 ENCOUNTER — Other Ambulatory Visit: Payer: Self-pay

## 2024-01-14 ENCOUNTER — Telehealth: Payer: Self-pay

## 2024-01-14 NOTE — Telephone Encounter (Signed)
 Pharmacy Patient Advocate Encounter   Received notification from CoverMyMeds that prior authorization for dexcom G7 sensor is required/requested.   Insurance verification completed.   The patient is insured through [employer] .   Per test claim: PA required; PA submitted to above mentioned insurance via CoverMyMeds Key/confirmation #/EOC [facility] Status is pending

## 2024-01-18 NOTE — Telephone Encounter (Signed)
 Pharmacy Patient Advocate Encounter  Received notification from [facility] that Prior Authorization for Dexcom G7 sensor has been APPROVED through 07/17/24   PA #/Case ID/Reference #:  [id number]

## 2024-01-20 ENCOUNTER — Encounter (HOSPITAL_COMMUNITY): Payer: Self-pay

## 2024-01-20 ENCOUNTER — Ambulatory Visit (HOSPITAL_COMMUNITY): Payer: MEDICAID

## 2024-01-20 NOTE — Therapy (Unsigned)
 [facility] Outpatient Rehabilitation at [facility] [address] [place], [place], [address] Phone: [phone]   Fax:  [phone]  Patient Details  Name: Jaime [name] MRN: [id number] Date of Birth: 03-09-1986 Referring Provider:  No ref. provider found  Encounter Date: 01/20/2024    Nelida Meuse, PT 01/20/2024, 1:12 PM  [facility] Outpatient Rehabilitation at [facility] [address] [place], [place], [address] Phone: [phone]   Fax:  [phone]

## 2024-01-20 NOTE — Telephone Encounter (Signed)
Patient has been called and made aware.  °

## 2024-01-20 NOTE — Therapy (Signed)
 [facility] Outpatient Rehabilitation at [facility] [address] [place], [place], [address] Phone: [phone]   Fax:  [phone]  Patient Details  Name: Jaime [name] MRN: [id number] Date of Birth: 21-Mar-1986 Referring Provider:  No ref. provider found  Encounter Date: 01/20/2024  Called pt regarding no show for evaluation. Pt reports being out of town currently and will be back Tuesday. Pt desiring to re-schedule evaluation. Pt was transfers to care coordinators upfront to reschedule evaluation.   Nelida Meuse PT, DPT [facility]- [place] [address] [phone] office   Nelida Meuse, PT 01/20/2024, 1:15 PM  [facility] Outpatient Rehabilitation at [facility] [address] [place], [place], [address] Phone: [phone]   Fax:  [phone]

## 2024-02-03 ENCOUNTER — Telehealth (INDEPENDENT_AMBULATORY_CARE_PROVIDER_SITE_OTHER): Payer: Self-pay | Admitting: *Deleted

## 2024-02-03 NOTE — Telephone Encounter (Signed)
 Received PA from cover my meds for hyoscyamine. Submitted info and got a N/a response. Letter faxed from [employer] stating medication is covered under pharmacy benefit without prior approval.

## 2024-02-07 ENCOUNTER — Ambulatory Visit: Payer: MEDICAID | Admitting: Primary Care

## 2024-02-07 ENCOUNTER — Encounter: Payer: Self-pay | Admitting: Primary Care

## 2024-02-11 ENCOUNTER — Ambulatory Visit (HOSPITAL_COMMUNITY): Payer: MEDICAID

## 2024-02-11 ENCOUNTER — Encounter (HOSPITAL_COMMUNITY): Payer: Self-pay

## 2024-02-11 ENCOUNTER — Other Ambulatory Visit: Payer: Self-pay

## 2024-02-11 DIAGNOSIS — R2689 Other abnormalities of gait and mobility: Secondary | ICD-10-CM | POA: Insufficient documentation

## 2024-02-11 DIAGNOSIS — M5459 Other low back pain: Secondary | ICD-10-CM | POA: Diagnosis present
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
## 2024-02-14 ENCOUNTER — Encounter (HOSPITAL_COMMUNITY): Payer: Self-pay | Admitting: Vascular Surgery

## 2024-02-14 ENCOUNTER — Other Ambulatory Visit: Payer: Self-pay

## 2024-02-14 ENCOUNTER — Ambulatory Visit (HOSPITAL_COMMUNITY)
Admission: RE | Admit: 2024-02-14 | Discharge: 2024-02-14 | Disposition: A | Discharge status: Home / Self Care | Payer: MEDICAID | Source: Ambulatory Visit | Attending: Vascular Surgery | Admitting: Vascular Surgery

## 2024-02-14 ENCOUNTER — Ambulatory Visit (HOSPITAL_COMMUNITY)
Admission: RE | Disposition: A | Discharge status: Home / Self Care | Payer: Self-pay | Source: Ambulatory Visit | Attending: Vascular Surgery

## 2024-02-14 DIAGNOSIS — Z7982 Long term (current) use of aspirin: Secondary | ICD-10-CM | POA: Insufficient documentation

## 2024-02-14 DIAGNOSIS — I824Y2 Acute embolism and thrombosis of unspecified deep veins of left proximal lower extremity: Secondary | ICD-10-CM

## 2024-02-14 DIAGNOSIS — Z79899 Other long term (current) drug therapy: Secondary | ICD-10-CM | POA: Diagnosis not present

## 2024-02-14 DIAGNOSIS — Z86718 Personal history of other venous thrombosis and embolism: Secondary | ICD-10-CM | POA: Insufficient documentation

## 2024-02-14 DIAGNOSIS — Z7901 Long term (current) use of anticoagulants: Secondary | ICD-10-CM | POA: Diagnosis not present

## 2024-02-14 DIAGNOSIS — I82512 Chronic embolism and thrombosis of left femoral vein: Secondary | ICD-10-CM

## 2024-02-14 DIAGNOSIS — Z4589 Encounter for adjustment and management of other implanted devices: Secondary | ICD-10-CM | POA: Diagnosis present

## 2024-02-14 DIAGNOSIS — I80202 Phlebitis and thrombophlebitis of unspecified deep vessels of left lower extremity: Secondary | ICD-10-CM

## 2024-02-14 HISTORY — PX: IVC FILTER REMOVAL: CATH118246

## 2024-02-14 LAB — HCG, SERUM, QUALITATIVE: Preg, Serum: NEGATIVE

## 2024-02-14 LAB — GLUCOSE, CAPILLARY
Glucose-Capillary: 66 mg/dL — ABNORMAL LOW (ref 70–99)
Glucose-Capillary: 59 mg/dL — ABNORMAL LOW (ref 70–99)
Glucose-Capillary: 90 mg/dL (ref 70–99)
Glucose-Capillary: 105 mg/dL — ABNORMAL HIGH (ref 70–99)

## 2024-02-14 LAB — POCT I-STAT, CHEM 8
BUN: 18 mg/dL (ref 6–20)
Calcium, Ion: 1.15 mmol/L (ref 1.15–1.40)
Chloride: 92 mmol/L — ABNORMAL LOW (ref 98–111)
Creatinine, Ser: 1 mg/dL (ref 0.44–1.00)
Glucose, Bld: 88 mg/dL (ref 70–99)
HCT: 45 % (ref 36.0–46.0)
Hemoglobin: 15.3 g/dL — ABNORMAL HIGH (ref 12.0–15.0)
Potassium: 4 mmol/L (ref 3.5–5.1)
Sodium: 138 mmol/L (ref 135–145)
TCO2: 34 mmol/L — ABNORMAL HIGH (ref 22–32)

## 2024-02-14 SURGERY — IVC FILTER REMOVAL
Anesthesia: LOCAL

## 2024-02-14 MED ORDER — DEXTROSE 50 % IV SOLN
INTRAVENOUS | Status: AC
  Filled 2024-02-14: qty 50

## 2024-02-14 MED ORDER — METHYLPREDNISOLONE SODIUM SUCC 125 MG IJ SOLR
INTRAMUSCULAR | Status: AC
  Filled 2024-02-14: qty 2

## 2024-02-14 MED ORDER — SODIUM CHLORIDE 0.9 % IV SOLN
INTRAVENOUS | Status: DC | PRN
  Administered 2024-02-14: 10 mL/h via INTRAVENOUS

## 2024-02-14 MED ORDER — METHYLPREDNISOLONE SODIUM SUCC 125 MG IJ SOLR
INTRAMUSCULAR | Status: DC | PRN
  Administered 2024-02-14: 125 mg via INTRAVENOUS

## 2024-02-14 MED ORDER — SODIUM CHLORIDE 0.9% FLUSH: 3.0000 mL | INTRAVENOUS | Status: DC | PRN

## 2024-02-14 MED ORDER — FAMOTIDINE IN NACL 20-0.9 MG/50ML-% IV SOLN
20.0000 mg | INTRAVENOUS | Status: AC
  Administered 2024-02-14: 20 mg via INTRAVENOUS
  Filled 2024-02-14: qty 50

## 2024-02-14 MED ORDER — DEXTROSE 50 % IV SOLN
INTRAVENOUS | Status: DC | PRN
  Administered 2024-02-14: .5 via INTRAVENOUS

## 2024-02-14 MED ORDER — METHYLPREDNISOLONE SODIUM SUCC 125 MG IJ SOLR
125.0000 mg | INTRAMUSCULAR | Status: DC
Start: 2024-02-14 — End: 2024-02-14

## 2024-02-14 MED ORDER — HEPARIN (PORCINE) IN NACL 1000-0.9 UT/500ML-% IV SOLN
INTRAVENOUS | Status: DC | PRN
  Administered 2024-02-14: 500 mL

## 2024-02-14 MED ORDER — IODIXANOL 320 MG/ML IV SOLN
INTRAVENOUS | Status: DC | PRN
  Administered 2024-02-14: 12 mL

## 2024-02-14 MED ORDER — DIPHENHYDRAMINE HCL 50 MG/ML IJ SOLN
25.0000 mg | INTRAMUSCULAR | Status: DC
Start: 2024-02-14 — End: 2024-02-14

## 2024-02-14 MED ORDER — LIDOCAINE HCL (PF) 1 % IJ SOLN
INTRAMUSCULAR | Status: AC
  Filled 2024-02-14: qty 30

## 2024-02-14 MED ORDER — SODIUM CHLORIDE 0.9% FLUSH: 3.0000 mL | Freq: Two times a day (BID) | INTRAVENOUS | Status: DC

## 2024-02-14 SURGICAL SUPPLY — 16 items
CATH OMNI FLUSH 5F 65CM (CATHETERS) IMPLANT
CATH SOS OMNI O 5F 80CM (CATHETERS) IMPLANT
COVER DOME SNAP 22 D (MISCELLANEOUS) IMPLANT
GUIDEWIRE ANGLED .035X260CM (WIRE) IMPLANT
KIT MICROPUNCTURE NIT STIFF (SHEATH) IMPLANT
KIT SNARE RETRIEVAL (KITS) IMPLANT
KIT SYRINGE INJ CVI SPIKEX1 (MISCELLANEOUS) IMPLANT
PAD HEMO NEPTUNE PLUS 2X2 (PAD) IMPLANT
SET ATX-X65L (MISCELLANEOUS) IMPLANT
SET CLOVERSNARE FLT RETRIEVAL (MISCELLANEOUS) IMPLANT
SHEATH DRYSEAL FLEX 12FR 45CM (SHEATH) IMPLANT
SHEATH DRYSEAL FLEX 26FR 65CM (SHEATH) IMPLANT
SHEATH PROBE COVER 6X72 (BAG) IMPLANT
TRAY PV CATH (CUSTOM PROCEDURE TRAY) ×1 IMPLANT
WIRE BENTSON .035X145CM (WIRE) IMPLANT
WIRE ROSEN-J .035X180CM (WIRE) IMPLANT

## 2024-02-14 NOTE — Progress Notes (Signed)
 Respiratory status improved. Patient taking full breaths, rate 8, SpO2 94% on 2 LPM, ETCO2 now at 50 +/-. Still requires noxious stimuli to awaken patient.

## 2024-02-14 NOTE — Progress Notes (Signed)
 Patient hard to awaken. Requires physical agitation and loud noise to open eyes. Speech is slurred. Patient alert to person, with pain in the legs. Patient respirations are shallow, rate approximately 8 per minute, SpO2 on room air 87%. Supplemental Oxygen given (2 LPM) SpO2 increased to 94%, ETCO2 reading < 9. When prompted to take a breath ETCO2 rose to 50.

## 2024-02-14 NOTE — Op Note (Signed)
    Patient name: Jaime [name] MRN: [id number] DOB: Dec 06, 1985 Sex: female  02/14/2024 Pre-operative Diagnosis: DVT with IVC filter in place Post-operative diagnosis:  Same Surgeon:  Philipp Brawn, MD Procedure Performed:  IVC filter removal   Indications:  Jaime [name] is a 38 year old female with history of a left DVT and was unable to be anticoagulated due to thrombocytopenia.  She was then started on a DOAC once her platelet count improved and a iliac vein thrombectomy was performed.  She presented to clinic tolerating anticoagulation well and the risk of benefits of IVC filter removal were reviewed.  She expressed understanding and elected to proceed.  Findings:  IVC filter in place without thrombus noted on venogram Complete removal of intact IVC filter   Procedure:  The patient was identified in the holding area and taken to the cath lab  The patient was then placed supine on the table and prepped and draped in the usual sterile fashion.  A time out was called.  Ultrasound was used to evaluate the right internal jugular vein, it was patent and compressible.  This was accessed under ultrasound guidance after the skin was anesthetized with lidocaine.  A Bentson wire was then placed down into the IVC past the filter and the [facility] IVC filter retrieval sheath was placed over this just proximal to the filter tip.  An IVC venogram was obtained and I did not visualize any clot in the filter. I attempted for approximately 10 minutes to snare the hook with the provided snare although this was unsuccessful.  I attempted with a loop snare as well.  I then decided to upsized the sheath to a 12 French dry seal.  Using a Glidewire, Omni Flush catheter and the snare was able to loop the Glidewire around the main interstices and pulled both ends through the sheath.  Unfortunately the sheath would still not track over the tip of the filter.  My partner Dr. Christia Cowboy and came in for assistance he also attempted  to track the sheath over but unfortunately was sliding beside to the tip of the sheath and the filter was not tracking into the sheath.  We then attempted to try to snare the tip again but was unsuccessful.  The Glidewire was pulled back the loop around the filter was intentionally lost.  The 12 French dry seal sheath was then upsized to a 26 [place] dry seal.  I then again used a Glidewire, Omni Flush catheter and snare in order to obtain a loop around the filter again both ends were then pulled through the sheath and pulled taut.  The 60 French sheath was then able to be passed over the top of the filter and the filter was pulled into the sheath with some effort.  The filter was then removed through the sheath and inspected which was free from clot and completely intact.  Venogram was then obtained which demonstrated flow in the IVC and without any obvious injury.  The 71 French sheath was removed from the right IJ and manual pressure held.  Impression: Complete removal of intact IVC filter   Philipp Brawn MD [facility] Office: [phone]

## 2024-02-14 NOTE — H&P (Signed)
 Patient seen and examined in preop holding.  No complaints. No changes to medication history or physical exam since last seen in clinic. After discussing the risks and benefits of IVC filter removal, Jaime [name] elected to proceed.   Jaime Pastures MD       Patient ID: Jaime [name], female   DOB: 1986-06-29, 38 y.o.   MRN: [id number]   Reason for Consult: Routine Post Op   Referred by Jaime [name]*   Subjective:    Subjective HPI   Jaime [name] is a 38 y.o. female with a history of DVT who presents for follow-up.  She had an IVC filter placed when she was diagnosed with DVT of the left lower extremity due to inability to be anticoagulated due to thrombocytopenia.  His platelet count had improved and she was started on a DOAC.  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  Today she reports the left leg is less swollen but she is still having pain.  She is not currently in compression.       Past Medical History:  Diagnosis Date   Acute encephalopathy 08/31/2020   Adjustment disorder with depressed mood 09/07/2020   Anxiety     Asthma     Chronic abdominal pain     Chronic back pain     Chronic prescription opiate use 02/16/2022   Cirrhosis (HCC)     Cirrhosis (HCC)     COPD (chronic obstructive pulmonary disease) (HCC)     Depression     Diabetes mellitus without complication (HCC)     Diabetes mellitus, type II (HCC)     Diverticulosis     DVT (deep venous thrombosis) (HCC) 02/04/2023    taking lovenox prior to procedure   Fatty liver 03/07/2020   GERD (gastroesophageal reflux disease)     Hepatitis C     Hernia, abdominal     Hyperglycemia due to type 2 diabetes mellitus (HCC) 05/24/2020   Hyperlipidemia     IBS (irritable bowel syndrome) 02/16/2022   Insomnia     Long-term current  use of methadone for opiate dependence (HCC)     Lupus     Migraine headache     Morbid obesity (HCC) 05/24/2020   Neuropathy     Nocturnal seizures (HCC)     Pain of upper abdomen 07/28/2021   Peptic ulcer     Spleen enlarged     Splenomegaly               Family History  Problem Relation Age of Onset   Hyperlipidemia Maternal Grandfather               Past Surgical History:  Procedure Laterality Date   BIOPSY   11/25/2021    Procedure: BIOPSY;  Surgeon: Dolores Frame, MD;  Location: [facility];  Service: Gastroenterology;;   CHOLECYSTECTOMY       COLONOSCOPY WITH PROPOFOL N/A 11/25/2021    Procedure: COLONOSCOPY WITH PROPOFOL;  Surgeon: Dolores Frame, MD;  Location: [facility];  Service: Gastroenterology;  Laterality: N/A;  805   COLONOSCOPY WITH PROPOFOL N/A 03/31/2022    Procedure: COLONOSCOPY WITH PROPOFOL;  Surgeon: Dolores Frame, MD;  Location: [facility];  Service: Gastroenterology;  Laterality: N/A;  730   ESOPHAGOGASTRODUODENOSCOPY  06/2020    done at [facility], candida in upper esophagus (treated with diflucan), ulcerative esophagitis at GE junction, gastritis in stomach, single ulcer in duodenal bulb, with duodenal mucosa showing no abnormality. No presence of varices   ESOPHAGOGASTRODUODENOSCOPY (EGD) WITH PROPOFOL N/A 11/25/2021    Procedure: ESOPHAGOGASTRODUODENOSCOPY (EGD) WITH PROPOFOL;  Surgeon: Urban Garden, MD;  Location: [facility];  Service: Gastroenterology;  Laterality: N/A;   ESOPHAGOGASTRODUODENOSCOPY (EGD) WITH PROPOFOL N/A 03/31/2022    Procedure: ESOPHAGOGASTRODUODENOSCOPY (EGD) WITH PROPOFOL;  Surgeon: Urban Garden, MD;  Location: [facility];  Service: Gastroenterology;  Laterality: N/A;   IVC FILTER REMOVAL N/A 09/16/2023    Procedure: IVC FILTER REMOVAL;  Surgeon: Philipp Brawn, MD;  Location: [facility];  Service: Cardiovascular;  Laterality: N/A;   PERIPHERAL  VASCULAR THROMBECTOMY Left 09/16/2023    Procedure: PERIPHERAL VASCULAR THROMBECTOMY;  Surgeon: Philipp Brawn, MD;  Location: [facility] CV [facility];  Service: Cardiovascular;  Laterality: Left;          Short Social History:  Social History         Tobacco Use   Smoking status: Every Day      Current packs/day: 0.50      Types: Cigarettes      Passive exposure: Current   Smokeless tobacco: Never  Substance Use Topics   Alcohol use: No      Allergies       Allergies  Allergen Reactions   Firvanq [Vancomycin] Swelling      Facial swelling   Iodine-131 Anaphylaxis, Shortness Of Breath and Swelling   Ivp Dye [Iodinated Contrast Media] Anaphylaxis      Skin redness Difficulty walking   Toradol [Ketorolac Tromethamine] Shortness Of Breath      Flares ulcers   Tylenol [Acetaminophen] Other (See Comments)      Hx of cirrhosis     Neurontin [Gabapentin] Itching   Nsaids Other (See Comments)      Flares ulcers   Suboxone [Buprenorphine Hcl-Naloxone Hcl] Other (See Comments)      Withdrawal symptoms               Current Outpatient Medications  Medication Sig Dispense Refill   acetaminophen (TYLENOL) 500 MG tablet Take 1,000 mg by mouth 2 (two) times daily as needed for moderate pain (pain score 4-6) or headache.       albuterol (VENTOLIN HFA) 108 (90 Base) MCG/ACT inhaler Inhale 2 puffs into the lungs every 6 (six) hours as needed for wheezing or shortness of breath. 8 g 0   apixaban (ELIQUIS) 5 MG TABS tablet Take 2 tablets (10 mg total) by mouth 2 (two) times daily for 6 days, THEN 1 tablet (5 mg total) 2 (two) times daily. 60 tablet 4   ARIPiprazole (ABILIFY) 20 MG tablet Take 1 tablet (20 mg total) by mouth daily. 30 tablet 2   clonazePAM (KLONOPIN) 0.5 MG tablet Take 0.5 mg by mouth 2 (two) times daily as needed for anxiety.       Continuous Glucose Sensor (DEXCOM G7 SENSOR) MISC Inject 1 Application into the skin as directed. Change sensor every 10 days as directed. 9  each 3   Continuous Glucose Transmitter (DEXCOM G6 TRANSMITTER) MISC USE AS DIRECTED. 1 each 1   famotidine (PEPCID) 20 MG tablet Take 1 tablet (20 mg total) by mouth at bedtime.       ferrous sulfate 325 (65 FE) MG EC tablet Take 1 tablet (325 mg total) by mouth  daily with breakfast. 90 tablet 3   glucose blood (ACCU-CHEK GUIDE) test strip Use as instructed to check blood glucose four times daily 150 each 5   hydrOXYzine (VISTARIL) 50 MG capsule Take 1 capsule (50 mg total) by mouth 3 (three) times daily as needed. 30 capsule 2   hyoscyamine (ANASPAZ) 0.125 MG TBDP disintergrating tablet Place 1 tablet (0.125 mg total) under the tongue every 6 (six) hours as needed. 180 tablet 1   Insulin Pen Needle (PEN NEEDLES) 32G X 6 MM MISC 1 each by Does not apply route 3 (three) times daily. 100 each 3   insulin regular human CONCENTRATED (HUMULIN R U-500 KWIKPEN) 500 UNIT/ML KwikPen Inject 80 Units into the skin 3 (three) times daily with meals. (Patient taking differently: Inject 90 Units into the skin 3 (three) times daily with meals.) 45 mL 3   lidocaine (XYLOCAINE) 5 % ointment Apply 1 Application topically as needed. (Patient taking differently: Apply 1 Application topically 2 (two) times daily as needed (pain).) 35.44 g 5   lisinopril (ZESTRIL) 5 MG tablet Take 5 mg by mouth daily.       metFORMIN (GLUCOPHAGE) 1000 MG tablet Take 1 tablet (1,000 mg total) by mouth 2 (two) times daily with a meal. take 1 tablet by mouth 2 times a day with a meal. 180 tablet 3   methadone (DOLOPHINE) 10 MG/ML solution Take 75 mg by mouth daily.       methocarbamol (ROBAXIN) 750 MG tablet Take 750 mg by mouth every 6 (six) hours as needed for muscle spasms.       nicotine (NICODERM CQ - DOSED IN MG/24 HOURS) 21 mg/24hr patch Place 1 patch (21 mg total) onto the skin daily. 28 patch 0   nystatin cream (MYCOSTATIN) Apply 1 Application topically 2 (two) times daily. 30 g 0   Omega-3 Fatty Acids (ENTERIC FISH OIL) 1000 MG  CPDR Take by mouth. tid       omeprazole (PRILOSEC) 40 MG capsule TAKE 1 CAPSULE BY MOUTH TWICE DAILY. 120 capsule 5   ondansetron (ZOFRAN) 4 MG tablet Take 1 tablet (4 mg total) by mouth every 8 (eight) hours as needed for nausea or vomiting. 20 tablet 1   Oxycodone HCl 10 MG TABS Take 1 tablet (10 mg total) by mouth every 6 (six) hours as needed (pain). 30 tablet 0   pantoprazole (PROTONIX) 20 MG tablet Take 20 mg by mouth daily.       potassium chloride (KLOR-CON) 10 MEQ tablet Take 10 mEq by mouth 2 (two) times daily.       propranolol (INDERAL) 10 MG tablet Take 1 tablet (10 mg total) by mouth 2 (two) times daily.       QUEtiapine (SEROQUEL) 100 MG tablet Take 1 tablet (100 mg total) by mouth at bedtime. 30 tablet 3   tirzepatide (MOUNJARO) 7.5 MG/0.5ML Pen Inject 7.5 mg into the skin once a week. 6 mL 1   torsemide (DEMADEX) 20 MG tablet Take 2 tablets (40 mg total) by mouth daily.       venlafaxine XR (EFFEXOR XR) 150 MG 24 hr capsule Take 1 capsule (150 mg total) by mouth daily with breakfast. 30 capsule 3   predniSONE (DELTASONE) 20 MG tablet Take 1 tablet (20 mg total) by mouth 2 (two) times daily with a meal for 5 days. 10 tablet 0               Current Facility-Administered Medications  Medication Dose Route Frequency  Provider Last Rate Last Admin   etonogestrel (NEXPLANON) implant 68 mg  68 mg Subdermal Once Hermina Staggers, MD       methylPREDNISolone acetate (DEPO-MEDROL) injection 40 mg  40 mg Intramuscular Once              REVIEW OF SYSTEMS    All other systems were reviewed and are negative       Objective:    Objective[] Expand by Default    Vitals:    01/07/24 0930  BP: 129/80  Pulse: 96  Temp: 98.4 F (36.9 C)  SpO2: 97%  Weight: 241 lb (109.3 kg)  Height: 5\' 10"  (1.778 m)    Body mass index is 34.58 kg/m.   Physical Exam General: no acute distress Cardiac: hemodynamically stable Pulm: normal work of breathing Extremities: Minimal edema of the  left lower leg from ankle to mid shin.   Data: IVC/Iliac Findings:  +----------+------+--------+--------+    IVC    PatentThrombusComments  +----------+------+--------+--------+  IVC Prox                NV        +----------+------+--------+--------+  IVC Mid                 NV        +----------+------+--------+--------+  IVC Distal              NV        +----------+------+--------+--------+     +-------------------+---------+-----------+---------+-----------+--------+         CIV        RT-PatentRT-ThrombusLT-PatentLT-ThrombusComments  +-------------------+---------+-----------+---------+-----------+--------+  Common Iliac Prox                                             NV     +-------------------+---------+-----------+---------+-----------+--------+  Common Iliac Mid                                              NV     +-------------------+---------+-----------+---------+-----------+--------+  Common Iliac Distal                                           NV     +-------------------+---------+-----------+---------+-----------+--------+      +-----------------+---------+-----------+---------+-----------+------------  ---+        EIV       RT-PatentRT-ThrombusLT-PatentLT-Thrombus   Comments       +-----------------+---------+-----------+---------+-----------+------------  ---+  External Iliac                        patent                Residual       Vein Distal                                                  chronic  thrombus in  the                                                          distal  segment                                                              of the  EIV,    +-----------------+---------+-----------+---------+-----------+------------  ---+    Left DVT  study +---------+---------------+---------+-----------+----------+---------------  ----+  LEFT    CompressibilityPhasicitySpontaneityPropertiesThrombus Aging        +---------+---------------+---------+-----------+----------+---------------  ----+  CFV     Partial        No       No                                         +---------+---------------+---------+-----------+----------+---------------  ----+  SFJ     Full                                                               +---------+---------------+---------+-----------+----------+---------------  ----+  FV Prox  Partial        No       No                                         +---------+---------------+---------+-----------+----------+---------------  ----+  FV Mid   None           No       No                                         +---------+---------------+---------+-----------+----------+---------------  ----+  FV DistalNone           No       No                                         +---------+---------------+---------+-----------+----------+---------------  ----+  PFV                                                  Not well  visualized  +---------+---------------+---------+-----------+----------+---------------  ----+  POP     None           No       No                                         +---------+---------------+---------+-----------+----------+---------------  ----+  PTV     Full           Yes      Yes                                        +---------+---------------+---------+-----------+----------+---------------  ----+  PERO                                                 Not well  visualized  +---------+---------------+---------+-----------+----------+---------------  ----+  Gastroc                                              Not well  visualized   +---------+---------------+---------+-----------+----------+---------------  ----+  GSV     Full           Yes      Yes                                        +---------+---------------+---------+-----------+----------+---------------  ----+  SSV     Full           Yes      Yes                                        +---------+---------------+---------+-----------+----------+---------------  ----+         Assessment/Plan:    Assessment Jaime [name] is a 38 y.o. female with history of left DVT status post thrombectomy, still with an IVC filter in place.  I recommended IVC filter removal discussed the risk and benefits and she elected to proceed.     Will plan to schedule for 3/17, explained she will have to hold her Eliquis for 2 days prior to the procedure. 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     Recommendations to optimize cardiovascular risk: Abstinence from all tobacco products. Blood glucose control with goal A1c < 7%. Blood pressure control with goal blood pressure < 140/90 mmHg. Lipid reduction therapy with goal LDL-C <100 mg/dL  Aspirin 81mg  PO QD.  Atorvastatin 40-80mg  PO QD (or other "high intensity" statin therapy).       Philipp Brawn MD [facility]

## 2024-02-14 NOTE — Progress Notes (Signed)
 Called Dr Susi Eric and order noted to d/c now

## 2024-02-14 NOTE — Progress Notes (Addendum)
 Jennifer,RN notified of client only awakens with tactile stimuli and has slurred speech and falling asleep while talking; [facility] will have Dr Susi Eric come and see client

## 2024-02-14 NOTE — Discharge Instructions (Addendum)
 Ok to restart eliquis tomorrow morning; no metformin for 2 days

## 2024-02-15 ENCOUNTER — Ambulatory Visit (HOSPITAL_COMMUNITY): Payer: MEDICAID

## 2024-02-15 ENCOUNTER — Encounter (HOSPITAL_COMMUNITY): Payer: Self-pay

## 2024-02-15 DIAGNOSIS — M5459 Other low back pain: Secondary | ICD-10-CM | POA: Diagnosis not present

## 2024-02-15 DIAGNOSIS — R2689 Other abnormalities of gait and mobility: Secondary | ICD-10-CM
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
## 2024-02-16 MED FILL — Lidocaine HCl Local Preservative Free (PF) Inj 1%: INTRAMUSCULAR | Qty: 30 | Status: AC

## 2024-02-22 ENCOUNTER — Ambulatory Visit (HOSPITAL_COMMUNITY): Payer: MEDICAID

## 2024-02-22 ENCOUNTER — Encounter (INDEPENDENT_AMBULATORY_CARE_PROVIDER_SITE_OTHER): Payer: Self-pay | Admitting: Gastroenterology

## 2024-02-22 ENCOUNTER — Encounter (HOSPITAL_COMMUNITY): Payer: Self-pay

## 2024-02-22 NOTE — Therapy (Signed)
 [facility] Outpatient Rehabilitation at [facility] [address] [place], [place], [address] Phone: [phone]   Fax:  [phone]  Patient Details  Name: Jaime [name] MRN: [id number] Date of Birth: [birth year] Referring Provider:  No ref. provider found  Encounter Date: 02/22/2024  Pt called regarding no show #1. Pt said she forgot. Pt informed/reminded of no show policy and confirmed next appointment.  Gatha Kaska PT, DPT [facility] [phone] office  Gatha Kaska, PT 02/22/2024, 8:39 AM  [facility] Outpatient Rehabilitation at [facility] [address] [place], [place], [address] Phone: [phone]   Fax:  [phone]

## 2024-02-28 ENCOUNTER — Other Ambulatory Visit (HOSPITAL_COMMUNITY): Payer: Self-pay

## 2024-02-29 ENCOUNTER — Telehealth: Payer: Self-pay | Admitting: *Deleted

## 2024-02-29 ENCOUNTER — Other Ambulatory Visit: Payer: Self-pay | Admitting: *Deleted

## 2024-02-29 DIAGNOSIS — Z7985 Long-term (current) use of injectable non-insulin antidiabetic drugs: Secondary | ICD-10-CM

## 2024-02-29 DIAGNOSIS — E1165 Type 2 diabetes mellitus with hyperglycemia: Secondary | ICD-10-CM

## 2024-02-29 DIAGNOSIS — Z7984 Long term (current) use of oral hypoglycemic drugs: Secondary | ICD-10-CM

## 2024-02-29 DIAGNOSIS — Z794 Long term (current) use of insulin: Secondary | ICD-10-CM

## 2024-02-29 MED ORDER — FREESTYLE LIBRE 3 READER DEVI: 0 refills | Status: AC

## 2024-02-29 MED ORDER — FREESTYLE LIBRE 3 PLUS SENSOR MISC: 2.0000 | 0 refills | Status: DC

## 2024-02-29 NOTE — Telephone Encounter (Signed)
 We could always try sending in the [place] 3 plus to see if it helps.  Make sure she is doing proper site care prior to putting the sensor on.  And ask if she has tried using skin tac to help hold it on.

## 2024-02-29 NOTE — Telephone Encounter (Signed)
 Talked with the patient, she verbalized how to do proper skin care prior to applying the sensor. She has used the skin tac before. Today she shares that when she has used the sensors, when it comes time for the paring, the code is entered, it just searches and then it is not able to pare. She has the [employer] on her new [employer] phone. Jaime [name] is asking that we call in the [facility] 3 to the [facility] in [place]. A prescription has been sent in.

## 2024-02-29 NOTE — Telephone Encounter (Signed)
 Patient left a message on 02/28/24. She mentions that her G7 has been a problem. Unable to keep it on. She has not been able to get any readings, she brought a brand new phone and because it is new, she does not feel that it is the problem. She called Dex[employer] after using up the 12 that she had, they replaced them , she has used them several of them so far. She was going to start doing finger readings. Jaime [name] is asking if there is another device that may work better for her.

## 2024-03-02 ENCOUNTER — Ambulatory Visit (HOSPITAL_COMMUNITY): Payer: MEDICAID

## 2024-03-02 ENCOUNTER — Encounter (HOSPITAL_COMMUNITY): Payer: Self-pay

## 2024-03-02 DIAGNOSIS — R2689 Other abnormalities of gait and mobility: Secondary | ICD-10-CM | POA: Diagnosis present

## 2024-03-02 DIAGNOSIS — M5459 Other low back pain: Secondary | ICD-10-CM | POA: Insufficient documentation
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
## 2024-03-03 ENCOUNTER — Encounter (HOSPITAL_COMMUNITY): Payer: MEDICAID

## 2024-03-06 ENCOUNTER — Other Ambulatory Visit (HOSPITAL_COMMUNITY): Payer: Self-pay

## 2024-03-06 ENCOUNTER — Telehealth: Payer: Self-pay | Admitting: Nurse Practitioner

## 2024-03-06 ENCOUNTER — Ambulatory Visit (INDEPENDENT_AMBULATORY_CARE_PROVIDER_SITE_OTHER): Payer: MEDICAID | Admitting: Nurse Practitioner

## 2024-03-06 ENCOUNTER — Telehealth: Payer: Self-pay

## 2024-03-06 ENCOUNTER — Encounter: Payer: Self-pay | Admitting: Nurse Practitioner

## 2024-03-06 VITALS — BP 104/60 | HR 100 | Ht 70.0 in | Wt 217.6 lb

## 2024-03-06 DIAGNOSIS — E1165 Type 2 diabetes mellitus with hyperglycemia: Secondary | ICD-10-CM

## 2024-03-06 DIAGNOSIS — Z7985 Long-term (current) use of injectable non-insulin antidiabetic drugs: Secondary | ICD-10-CM

## 2024-03-06 DIAGNOSIS — Z794 Long term (current) use of insulin: Secondary | ICD-10-CM | POA: Diagnosis not present

## 2024-03-06 DIAGNOSIS — E782 Mixed hyperlipidemia: Secondary | ICD-10-CM

## 2024-03-06 DIAGNOSIS — Z7984 Long term (current) use of oral hypoglycemic drugs: Secondary | ICD-10-CM

## 2024-03-06 LAB — POCT GLYCOSYLATED HEMOGLOBIN (HGB A1C): Hemoglobin A1C: 6.5 % — AB (ref 4.0–5.6)

## 2024-03-06 LAB — POCT UA - MICROALBUMIN
Creatinine, POC: 100 mg/dL
Microalbumin Ur, POC: 30 mg/L

## 2024-03-06 MED ORDER — TIRZEPATIDE 7.5 MG/0.5ML ~~LOC~~ SOAJ: 7.5000 mg | SUBCUTANEOUS | 1 refills | Status: DC

## 2024-03-06 MED ORDER — HUMULIN R U-500 KWIKPEN 500 UNIT/ML ~~LOC~~ SOPN: 80.0000 [IU] | PEN_INJECTOR | Freq: Three times a day (TID) | SUBCUTANEOUS | 3 refills | Status: AC

## 2024-03-06 NOTE — Telephone Encounter (Signed)
 Thank you :)

## 2024-03-06 NOTE — Progress Notes (Signed)
 03/06/2024, 9:51 AM     Endocrinology Follow Up Visit  Subjective:    Patient ID: Jaime [name], female    DOB: [birth year].  CASANDRA [name] is being seen in follow up for management of currently uncontrolled symptomatic diabetes requested by  Rosanna Comment, FNP.   Past Medical History:  Diagnosis Date   Acute encephalopathy 08/31/2020   Adjustment disorder with depressed mood 09/07/2020   Anxiety    Asthma    Chronic abdominal pain    Chronic back pain    Chronic prescription opiate use 02/16/2022   Cirrhosis (HCC)    Cirrhosis (HCC)    COPD (chronic obstructive pulmonary disease) (HCC)    Depression    Diabetes mellitus without complication (HCC)    Diabetes mellitus, type II (HCC)    Diverticulosis    DVT (deep venous thrombosis) (HCC) 02/04/2023   taking lovenox  prior to procedure   Fatty liver 03/07/2020   GERD (gastroesophageal reflux disease)    Hepatitis C    Hernia, abdominal    Hyperglycemia due to type 2 diabetes mellitus (HCC) 05/24/2020   Hyperlipidemia    IBS (irritable bowel syndrome) 02/16/2022   Insomnia    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)    Lupus    Migraine headache    Morbid obesity (HCC) 05/24/2020   Neuropathy    Nocturnal seizures (HCC)    Pain of upper abdomen 07/28/2021   Peptic ulcer    Spleen enlarged    Splenomegaly     Past Surgical History:  Procedure Laterality Date   BIOPSY  11/25/2021   Procedure: BIOPSY;  Surgeon: Urban Garden, MD;  [facility];  Service: Gastroenterology;;   CHOLECYSTECTOMY     COLONOSCOPY WITH PROPOFOL  N/A 11/25/2021   Procedure: COLONOSCOPY WITH PROPOFOL ;  Surgeon: Urban Garden, MD;  Location: [facility];  Service: Gastroenterology;  Laterality: N/A;  805   COLONOSCOPY WITH PROPOFOL  N/A 03/31/2022   Procedure: COLONOSCOPY WITH PROPOFOL ;  Surgeon:  Urban Garden, MD;  Location: [facility];  Service: Gastroenterology;  Laterality: N/A;  730   ESOPHAGOGASTRODUODENOSCOPY  06/2020   done at [facility], candida in upper esophagus (treated with diflucan ), ulcerative esophagitis at GE junction, gastritis in stomach, single ulcer in duodenal bulb, with duodenal mucosa showing no abnormality. No presence of varices   ESOPHAGOGASTRODUODENOSCOPY (EGD) WITH PROPOFOL  N/A 11/25/2021   Procedure: ESOPHAGOGASTRODUODENOSCOPY (EGD) WITH PROPOFOL ;  Surgeon: Urban Garden, MD;  [facility];  Service: Gastroenterology;  Laterality: N/A;   ESOPHAGOGASTRODUODENOSCOPY (EGD) WITH PROPOFOL  N/A 03/31/2022   Procedure: ESOPHAGOGASTRODUODENOSCOPY (EGD) WITH PROPOFOL ;  Surgeon: Urban Garden, MD;  [facility];  Service: Gastroenterology;  Laterality: N/A;   IVC FILTER REMOVAL N/A 09/16/2023   Procedure: IVC FILTER REMOVAL;  Surgeon: Philipp Brawn, MD;  Location: [facility] INVASIVE CV LAB;  Service: Cardiovascular;  Laterality: N/A;   IVC FILTER REMOVAL N/A 02/14/2024   Procedure: IVC FILTER REMOVAL;  Surgeon: Philipp Brawn, MD;  Location: [facility] INVASIVE CV LAB;  Service: Cardiovascular;  Laterality: N/A;   PERIPHERAL  VASCULAR THROMBECTOMY Left 09/16/2023   Procedure: PERIPHERAL VASCULAR THROMBECTOMY;  Surgeon: Philipp Brawn, MD;  Location: [facility] INVASIVE CV LAB;  Service: Cardiovascular;  Laterality: Left;    Social History   Socioeconomic History   Marital status: Married    Spouse name: Not on file   Number of children: Not on file   Years of education: Not on file   Highest education level: Some college, no degree  Occupational History   Not on file  Tobacco Use   Smoking status: Every Day    Current packs/day: 0.50    Types: Cigarettes    Passive exposure: Current   Smokeless tobacco: Never  Vaping Use   Vaping status: Every Day   Substances: Nicotine , Flavoring  Substance and Sexual Activity    Alcohol use: No   Drug use: No   Sexual activity: Yes    Birth control/protection: Implant  Other Topics Concern   Not on file  Social History Narrative   Not on file   Social Drivers of Health   Financial Resource Strain: High Risk (12/09/2023)   Overall Financial Resource Strain (CARDIA)    Difficulty of Paying Living Expenses: Very hard  Food Insecurity: Food Insecurity Present (12/09/2023)   Hunger Vital Sign    Worried About Running Out of Food in the Last Year: Often true    Ran Out of Food in the Last Year: Often true  Transportation Needs: No Transportation Needs (12/09/2023)   PRAPARE - Administrator, Civil Service (Medical): No    Lack of Transportation (Non-Medical): No  Physical Activity: Insufficiently Active (12/09/2023)   Exercise Vital Sign    Days of Exercise per Week: 1 day    Minutes of Exercise per Session: 10 min  Stress: Stress Concern Present (05/09/2020)   [employer] of Occupational Health - Occupational Stress Questionnaire    Feeling of Stress : Very much  Social Connections: Moderately Integrated (12/09/2023)   Social Connection and Isolation Panel [NHANES]    Frequency of Communication with Friends and Family: More than three times a week    Frequency of Social Gatherings with Friends and Family: More than three times a week    Attends Religious Services: 1 to 4 times per year    Active Member of [employer] or Organizations: Yes    Attends Banker Meetings: 1 to 4 times per year    Marital Status: Separated    Family History  Problem Relation Age of Onset   Hyperlipidemia Maternal Grandfather     Outpatient Encounter Medications as of 03/06/2024  Medication Sig   acetaminophen  (TYLENOL ) 500 MG tablet Take 1,000 mg by mouth 2 (two) times daily as needed for moderate pain (pain score 4-6) or headache.   albuterol  (VENTOLIN  HFA) 108 (90 Base) MCG/ACT inhaler Inhale 2 puffs into the lungs every 6 (six) hours as needed for wheezing or  shortness of breath.   apixaban  (ELIQUIS ) 5 MG TABS tablet Take 2 tablets (10 mg total) by mouth 2 (two) times daily for 6 days, THEN 1 tablet (5 mg total) 2 (two) times daily. (Patient taking differently: 1 tablet (5 mg total) 2 (two) times daily.)   ARIPiprazole  (ABILIFY ) 20 MG tablet Take 1 tablet (20 mg total) by mouth daily.   clonazePAM  (KLONOPIN ) 0.5 MG tablet Take 0.5 mg by mouth 2 (two) times daily as needed for anxiety.   Continuous Glucose Receiver (FREESTYLE LIBRE 3 READER) DEVI Use the [facility] Libre 3 Reader Device  to monitor blood sugars as directed by the provider.   Continuous Glucose Sensor (FREESTYLE LIBRE 3 PLUS SENSOR) MISC 2 each by Does not apply route as directed. Change sensor every 15 days.   famotidine  (PEPCID ) 20 MG tablet Take 1 tablet (20 mg total) by mouth at bedtime.   ferrous sulfate  325 (65 FE) MG EC tablet Take 1 tablet (325 mg total) by mouth daily with breakfast.   glucose blood (ACCU-CHEK GUIDE) test strip Use as instructed to check blood glucose four times daily   hydrOXYzine  (VISTARIL ) 50 MG capsule Take 1 capsule (50 mg total) by mouth 3 (three) times daily as needed.   hyoscyamine  (ANASPAZ ) 0.125 MG TBDP disintergrating tablet Place 1 tablet (0.125 mg total) under the tongue every 6 (six) hours as needed.   Insulin  Pen Needle (PEN NEEDLES) 32G X 6 MM MISC 1 each by Does not apply route 3 (three) times daily.   lidocaine  (XYLOCAINE ) 5 % ointment Apply 1 Application topically as needed. (Patient taking differently: Apply 1 Application topically 2 (two) times daily as needed (pain).)   lisinopril  (ZESTRIL ) 5 MG tablet Take 5 mg by mouth daily.   metFORMIN  (GLUCOPHAGE ) 1000 MG tablet Take 1 tablet (1,000 mg total) by mouth 2 (two) times daily with a meal. take 1 tablet by mouth 2 times a day with a meal.   methadone  (DOLOPHINE ) 10 MG/ML solution Take 105 mg by mouth daily.   methocarbamol (ROBAXIN) 750 MG tablet Take 750 mg by mouth every 6 (six) hours as needed  for muscle spasms.   nicotine  (NICODERM CQ  - DOSED IN MG/24 HOURS) 21 mg/24hr patch Place 1 patch (21 mg total) onto the skin daily.   nystatin  cream (MYCOSTATIN ) Apply 1 Application topically 2 (two) times daily.   Omega-3 Fatty Acids (ENTERIC FISH OIL) 1000 MG CPDR Take 1,000 mg by mouth 3 (three) times daily.   omeprazole  (PRILOSEC) 40 MG capsule TAKE 1 CAPSULE BY MOUTH TWICE DAILY.   ondansetron  (ZOFRAN ) 4 MG tablet Take 1 tablet (4 mg total) by mouth every 8 (eight) hours as needed for nausea or vomiting.   Oxycodone  HCl 10 MG TABS Take 1 tablet (10 mg total) by mouth every 6 (six) hours as needed (pain).   pantoprazole  (PROTONIX ) 20 MG tablet Take 20 mg by mouth daily.   potassium chloride  (KLOR-CON ) 10 MEQ tablet Take 10 mEq by mouth 2 (two) times daily.   propranolol  (INDERAL ) 10 MG tablet Take 1 tablet (10 mg total) by mouth 2 (two) times daily.   QUEtiapine  (SEROQUEL ) 100 MG tablet Take 1 tablet (100 mg total) by mouth at bedtime.   torsemide  (DEMADEX ) 20 MG tablet Take 2 tablets (40 mg total) by mouth daily.   venlafaxine  XR (EFFEXOR  XR) 150 MG 24 hr capsule Take 1 capsule (150 mg total) by mouth daily with breakfast.   [DISCONTINUED] insulin  regular human CONCENTRATED (HUMULIN  R U-500 KWIKPEN) 500 UNIT/ML KwikPen Inject 80 Units into the skin 3 (three) times daily with meals. (Patient taking differently: Inject 90 Units into the skin 3 (three) times daily with meals.)   [DISCONTINUED] tirzepatide  (MOUNJARO ) 7.5 MG/0.5ML Pen Inject 7.5 mg into the skin once a week.   Continuous Glucose Sensor (DEXCOM G7 SENSOR) MISC Inject 1 Application into the skin as directed. Change sensor every 10 days as directed. (Patient not taking: Reported on 03/06/2024)   Continuous Glucose Transmitter (DEXCOM G6 TRANSMITTER) MISC USE AS DIRECTED. (Patient not taking: Reported on 03/06/2024)   insulin  regular human CONCENTRATED (  HUMULIN  R U-500 KWIKPEN) 500 UNIT/ML KwikPen Inject 80 Units into the skin 3 (three)  times daily with meals.   tirzepatide  (MOUNJARO ) 7.5 MG/0.5ML Pen Inject 7.5 mg into the skin once a week.   Facility-Administered Encounter Medications as of 03/06/2024  Medication   etonogestrel  (NEXPLANON ) implant 68 mg   methylPREDNISolone  acetate (DEPO-MEDROL ) injection 40 mg    ALLERGIES: Allergies  Allergen Reactions   Firvanq  [Vancomycin ] Swelling    Facial swelling   Iodine -131 Anaphylaxis, Shortness Of Breath and Swelling   Ivp Dye [Iodinated Contrast Media] Anaphylaxis    Skin redness Difficulty walking   Toradol [Ketorolac Tromethamine] Shortness Of Breath    Flares ulcers   Tylenol  [Acetaminophen ] Other (See Comments)    Hx of cirrhosis    Neurontin  [Gabapentin ] Itching   Nsaids Other (See Comments)    Flares ulcers   Suboxone  [Buprenorphine Hcl-Naloxone  Hcl] Other (See Comments)    Withdrawal symptoms     VACCINATION STATUS: Immunization History  Administered Date(s) Administered   Influenza Inj Mdck Quad Pf 08/19/2022   Influenza, Seasonal, Injecte, Preservative Fre 09/26/2023   Influenza,inj,Quad PF,6+ Mos 07/18/2016, 07/07/2018, 07/04/2019, 07/27/2020   Influenza-Unspecified 08/11/2017, 07/19/2018   Moderna SARS-COV2 Booster Vaccination 04/09/2022   Moderna Sars-Covid-2 Vaccination 03/07/2020, 04/04/2020   PNEUMOCOCCAL CONJUGATE-20 09/26/2023   Pneumococcal Polysaccharide-23 07/18/2016, 07/07/2018, 07/04/2019, 08/01/2020   Tdap 07/04/2019    Diabetes She presents for her follow-up diabetic visit. She has type 2 diabetes mellitus. Onset time: She was diagnosed at approximate age of 28 years. Her disease course has been improving. There are no hypoglycemic associated symptoms. Pertinent negatives for hypoglycemia include no headaches or pallor. (Had symptoms of hypoglycemia when glucose dropped to 117.) Associated symptoms include foot paresthesias and weight loss. Pertinent negatives for diabetes include no blurred vision, no chest pain, no fatigue, no  polydipsia, no polyphagia and no polyuria. There are no hypoglycemic complications. (History of unresponsiveness requiring EMS and hospitalization- none recent) Symptoms are improving. Diabetic complications include heart disease and peripheral neuropathy. Risk factors for coronary artery disease include diabetes mellitus, obesity, sedentary lifestyle, dyslipidemia and hypertension. Current diabetic treatment includes intensive insulin  program and oral agent (monotherapy) (and Mounjaro ). She is compliant with treatment most of the time. Her weight is decreasing steadily. She is following a low salt and generally healthy diet. When asked about meal planning, she reported none. She has had a previous visit with a dietitian. She participates in exercise intermittently. Her home blood glucose trend is decreasing steadily. Her overall blood glucose range is 130-140 mg/dl. (She presents today, accompanied by her mother, with her meter showing glucose ranging between 67-163.  Her POCT A1c today is 6.5%, increasing slightly from last visit of 6.2% but still at goal.  She has had issues with Dexcom, sent in [place] 3 for her but waiting on PA.  She has lost another 13 lbs since last visit and feels really good!) An ACE inhibitor/angiotensin II receptor blocker is being taken. She does not see a podiatrist.Eye exam is not current.  Hypertension This is a chronic problem. The current episode started more than 1 month ago. The problem has been resolved since onset. The problem is controlled. Associated symptoms include peripheral edema. Pertinent negatives include no blurred vision, chest pain, headaches, palpitations or shortness of breath. There are no associated agents to hypertension. Risk factors for coronary artery disease include diabetes mellitus, dyslipidemia, obesity and sedentary lifestyle. Past treatments include diuretics and beta blockers. The current treatment provides no improvement. There are  no compliance  problems.     Review of systems  Constitutional: +decreasing body weight,  current Body mass index is 31.22 kg/m. , + fatigue, no subjective hyperthermia, no subjective hypothermia Eyes: no blurry vision, no xerophthalmia ENT: no sore throat, no nodules palpated in throat, no dysphagia/odynophagia, no hoarseness Cardiovascular: no chest pain, no shortness of breath, no palpitations, no leg swelling Respiratory: no cough, no shortness of breath Gastrointestinal: no nausea/vomiting/diarrhea Musculoskeletal: no muscle/joint aches Skin: no rashes, no hyperemia Neurological: no tremors, no numbness, no tingling, no dizziness Psychiatric: + depression-greatly improved, no anxiety  Objective:     BP 104/60 (BP Location: Left Arm, Patient Position: Sitting, Cuff Size: Large)   Pulse 100   Ht 5\' 10"  (1.778 m)   Wt 217 lb 9.6 oz (98.7 kg)   BMI 31.22 kg/m   Wt Readings from Last 3 Encounters:  03/06/24 217 lb 9.6 oz (98.7 kg)  02/14/24 230 lb (104.3 kg)  01/07/24 241 lb (109.3 kg)    BP Readings from Last 3 Encounters:  03/06/24 104/60  02/14/24 101/64  01/07/24 129/80     Physical Exam- Limited  Constitutional:  Body mass index is 31.22 kg/m. , not in acute distress, normal state of mind Eyes:  EOMI, no exophthalmos Musculoskeletal: no gross deformities, strength intact in all four extremities, no gross restriction of joint movements Skin:  no rashes, no hyperemia Neurological: no tremor with outstretched hands   Diabetic Foot Exam - Simple   Simple Foot Form Diabetic Foot exam was performed with the following findings: Yes 03/06/2024  9:46 AM  Visual Inspection No deformities, no ulcerations, no other skin breakdown bilaterally: Yes Sensation Testing Intact to touch and monofilament testing bilaterally: Yes Pulse Check Posterior Tibialis and Dorsalis pulse intact bilaterally: Yes Comments    CMP ( most recent) CMP     Component Value Date/Time   NA 138 02/14/2024  0543   NA 140 10/05/2023 1043   K 4.0 02/14/2024 0543   CL 92 (L) 02/14/2024 0543   CO2 25 10/05/2023 1043   GLUCOSE 88 02/14/2024 0543   BUN 18 02/14/2024 0543   BUN 7 10/05/2023 1043   CREATININE 1.00 02/14/2024 0543   CREATININE 0.66 05/18/2022 1113   CALCIUM  8.9 10/05/2023 1043   PROT 5.6 (L) 09/26/2023 0344   PROT 5.8 (L) 01/01/2023 1408   ALBUMIN  2.3 (L) 09/26/2023 0344   ALBUMIN  3.3 (L) 01/01/2023 1408   AST 20 09/26/2023 0344   ALT 18 09/26/2023 0344   ALKPHOS 150 (H) 09/26/2023 0344   BILITOT 0.9 09/26/2023 0344   BILITOT 1.3 (H) 01/01/2023 1408   GFRNONAA >60 09/28/2023 0438   GFRAA >60 08/01/2020 0704     Diabetic Labs (most recent): Lab Results  Component Value Date   HGBA1C 6.5 (A) 03/06/2024   HGBA1C 7.8 (A) 11/04/2023   HGBA1C 9.4 (A) 07/26/2023   MICROALBUR 30 mg/L 03/06/2024   MICROALBUR 30 12/10/2021   MICROALBUR 80 02/26/2021     Lipid Panel ( most recent) Lipid Panel     Component Value Date/Time   CHOL 119 09/17/2023 0426   CHOL 134 01/01/2023 1408   TRIG 92 09/17/2023 0426   HDL 31 (L) 09/17/2023 0426   HDL 35 (L) 01/01/2023 1408   CHOLHDL 3.8 09/17/2023 0426   VLDL 18 09/17/2023 0426   LDLCALC 70 09/17/2023 0426   LDLCALC 57 01/01/2023 1408   LABVLDL 42 (H) 01/01/2023 1408      Lab Results  Component Value  Date   TSH 2.280 01/01/2023   TSH 2.700 12/03/2021   TSH 0.635 09/08/2020   TSH 0.993 07/30/2020   TSH 1.990 07/23/2020   TSH 1.690 08/22/2010   FREET4 0.97 01/01/2023   FREET4 0.70 (L) 12/03/2021   FREET4 0.72 (L) 07/23/2020   FREET4 1.11 08/22/2010      Assessment & Plan:   1) Type 2 diabetes mellitus with hyperglycemia, without long-term current use of insulin  (HCC)  - Harvey Linen has currently uncontrolled symptomatic type 2 DM since  38 years of age.  She presents today, accompanied by her mother, with her meter showing glucose ranging between 67-163.  Her POCT A1c today is 6.5%, increasing slightly from last  visit of 6.2% but still at goal.  She has had issues with Dexcom, sent in [place] 3 for her but waiting on PA.  She has lost another 13 lbs since last visit and feels really good!  - I had a long discussion with her about the progressive nature of diabetes and the pathology behind its complications.  -her diabetes is complicated by obesity/sedentary life and she remains at a high risk for more acute and chronic complications which include CAD, CVA, CKD, retinopathy, and neuropathy. These are all discussed in detail with her.  - Nutritional counseling repeated at each appointment due to patients tendency to fall back in to old habits.  - The patient admits there is a room for improvement in their diet and drink choices. -  Suggestion is made for the patient to avoid simple carbohydrates from their diet including Cakes, Sweet Desserts / Pastries, Ice Cream, Soda (diet and regular), Sweet Tea, Candies, Chips, Cookies, Sweet Pastries, Store Bought Juices, Alcohol in Excess of 1-2 drinks a day, Artificial Sweeteners, Coffee Creamer, and "Sugar-free" Products. This will help patient to have stable blood glucose profile and potentially avoid unintended weight gain.   - I encouraged the patient to switch to unprocessed or minimally processed complex starch and increased protein intake (animal or plant source), fruits, and vegetables.   - Patient is advised to stick to a routine mealtimes to eat 3 meals a day and avoid unnecessary snacks (to snack only to correct hypoglycemia).  - I have approached her with the following individualized plan to manage  her diabetes and patient agrees:   -She is advised to decrease her U500 to 80 units TID (had still been injecting 90 units TID) with meals if glucose is above 90 and she is eating.  She can continue her Metformin  1000 mg po twice daily with meals and Mounjaro  7.5 mg SQ weekly.  -She is encouraged to continue to monitor glucose 4 times daily (using her CGM or  meter), before meals and before bed and log on the clinic sheets provided.  They are instructed to call the clinic if she has readings less than 70 or greater than 300 for 3 tests in a row.     - Specific targets for  A1c;  LDL, HDL,  and Triglycerides were discussed with the patient.  2) Blood Pressure /Hypertension: Her blood pressure is controlled to target.  She is advised to continue Lasix  80 mg po daily, continue Lisinopril  5 mg po daily, continue Propanolol 10 mg po TID, and Aldactone  100 mg o daily  3) Lipids/Hyperlipidemia:   Review of her recent lipid profile from 12/01/23 shows controlled LDL at 91 and elevated triglycerides of 167 (slowly improving).  She is advised to continue Crestor  10 mg po daily  at bedtime.  Side effects and precautions discussed with her.    4)  Weight/Diet:  Her Body mass index is 31.22 kg/m.  -   clearly complicating her diabetes care.   she is  a candidate for modest weight loss. I discussed with her the fact that loss of 5 - 10% of her  current body weight will have the most impact on her diabetes management.  Exercise, and detailed carbohydrates information provided  -  detailed on discharge instructions.  5) Vitamin D  deficiency Her most recent vitamin d  level on 12/03/21 was 9.8.  I discussed and initiated replenishment with Ergocalciferol  50000 units po weekly- she is still taking this.  6) Chronic Care/Health Maintenance: -she is on ACE and statin medications and is encouraged to initiate and continue to follow up with Ophthalmology, Dentist,  Podiatrist at least yearly or according to recommendations, and advised to stay away from smoking. I have recommended yearly flu vaccine and pneumonia vaccine at least every 5 years; moderate intensity exercise for up to 150 minutes weekly; and  sleep for at least 7 hours a day.  - she is advised to maintain close follow up with Del Abron Abt, FNP for primary care needs, as well as her other providers for  optimal and coordinated care.     I spent  49  minutes in the care of the patient today including review of labs from CMP, Lipids, Thyroid Function, Hematology (current and previous including abstractions from other facilities); face-to-face time discussing  her blood glucose readings/logs, discussing hypoglycemia and hyperglycemia episodes and symptoms, medications doses, her options of short and long term treatment based on the latest standards of care / guidelines;  discussion about incorporating lifestyle medicine;  and documenting the encounter. Risk reduction counseling performed per USPSTF guidelines to reduce obesity and cardiovascular risk factors.     Please refer to Patient Instructions for Blood Glucose Monitoring and Insulin /Medications Dosing Guide"  in media tab for additional information. Please  also refer to " Patient Self Inventory" in the Media  tab for reviewed elements of pertinent patient history.  Harvey Linen participated in the discussions, expressed understanding, and voiced agreement with the above plans.  All questions were answered to her satisfaction. she is encouraged to contact clinic should she have any questions or concerns prior to her return visit.    Follow up plan: - Return in about 4 months (around 07/07/2024) for Diabetes F/U with A1c in office, No previsit labs, Bring meter and logs.   Hulon Magic, [facility], [facility] [address] [place], [place] [address] Phone: [phone] Fax: [phone]   03/06/2024, 9:51 AM

## 2024-03-06 NOTE — Telephone Encounter (Signed)
 Jaime [name]

## 2024-03-06 NOTE — Telephone Encounter (Signed)
 The does have insulin  dependent diabetes.  She is not using a pump.  She is on U500 insulin  three times per day

## 2024-03-06 NOTE — Telephone Encounter (Signed)
 Pharmacy Patient Advocate Encounter   Received notification from Pt Calls Messages that prior authorization for Freestyle libre 3 plus is required/requested.   Insurance verification completed.   The patient is insured through [employer] .   Per test claim: PA required; PA started via CoverMyMeds. KEY [id number] . Please see clinical question(s) below that I am not finding the answer to in her chart and advise.     I see her insulin  has been d/c'd. CGM wont be approved without the use of insulin 

## 2024-03-07 ENCOUNTER — Encounter (HOSPITAL_COMMUNITY): Payer: MEDICAID

## 2024-03-07 ENCOUNTER — Encounter (HOSPITAL_COMMUNITY): Payer: Self-pay

## 2024-03-07 NOTE — Telephone Encounter (Signed)
 Will you please let patient know?  Thanks

## 2024-03-07 NOTE — Telephone Encounter (Signed)
 Patient was called and made aware.

## 2024-03-07 NOTE — Therapy (Signed)
 [facility] Outpatient Rehabilitation at [facility] [address] [place], [place], [address] Phone: [phone]   Fax:  [phone]  Patient Details  Name: Jaime [name] MRN: [id number] Date of Birth: 1986/10/30 Referring Provider:  No ref. provider found  Encounter Date: 03/07/2024  Pt was called concerning her missed reassessment appointment this morning at 9:30. Pt states she forgot about the appointment and that [birth year] was her birthday. Pt was reminded of her upcoming appointment time and pt reports she feels she would like to continue therapy at this point.   Armond Bertin, PT, DPT [facility] Office: [phone] 12:15 PM, 03/07/24  [facility] Outpatient Rehabilitation at [facility] [address] [place], [place], [address] Phone: [phone]   Fax:  [phone]

## 2024-03-07 NOTE — Telephone Encounter (Signed)
 Pharmacy Patient Advocate Encounter  Received notification from [facility] that Prior Authorization for [facility] plus has been APPROVED from 03/06/24 to 09/06/24   PA #/Case ID/Reference #:  [id number]

## 2024-03-09 ENCOUNTER — Telehealth (HOSPITAL_COMMUNITY): Payer: Self-pay

## 2024-03-09 ENCOUNTER — Encounter (HOSPITAL_COMMUNITY): Payer: MEDICAID

## 2024-03-09 NOTE — Telephone Encounter (Signed)
 3rd no show, called and left message concerning missed apt and included no show policy. DC to HEP per no show policy. Included contact number for any questions and to contact referring MD if wishes to resume therapy.   Jaime [name], LPTA/CLT; Johnye Napoleon [phone]

## 2024-03-14 ENCOUNTER — Encounter (HOSPITAL_COMMUNITY): Payer: MEDICAID

## 2024-03-16 ENCOUNTER — Encounter (HOSPITAL_COMMUNITY): Payer: MEDICAID

## 2024-03-16 ENCOUNTER — Other Ambulatory Visit: Payer: Self-pay | Admitting: Family Medicine

## 2024-03-21 ENCOUNTER — Encounter (HOSPITAL_COMMUNITY): Payer: MEDICAID

## 2024-03-23 ENCOUNTER — Encounter (HOSPITAL_COMMUNITY): Payer: MEDICAID

## 2024-03-28 ENCOUNTER — Encounter (HOSPITAL_COMMUNITY): Payer: MEDICAID | Admitting: Physical Therapy

## 2024-03-30 ENCOUNTER — Encounter (HOSPITAL_COMMUNITY): Payer: MEDICAID

## 2024-04-04 ENCOUNTER — Encounter (HOSPITAL_COMMUNITY): Payer: MEDICAID

## 2024-04-05 ENCOUNTER — Encounter: Payer: Self-pay | Admitting: Family Medicine

## 2024-04-05 ENCOUNTER — Ambulatory Visit: Payer: MEDICAID | Admitting: Family Medicine

## 2024-04-05 VITALS — BP 111/74 | HR 90 | Resp 16 | Ht 70.0 in | Wt 226.0 lb

## 2024-04-05 DIAGNOSIS — R9431 Abnormal electrocardiogram [ECG] [EKG]: Secondary | ICD-10-CM

## 2024-04-05 DIAGNOSIS — E559 Vitamin D deficiency, unspecified: Secondary | ICD-10-CM | POA: Diagnosis not present

## 2024-04-05 DIAGNOSIS — E038 Other specified hypothyroidism: Secondary | ICD-10-CM | POA: Diagnosis not present

## 2024-04-05 DIAGNOSIS — E782 Mixed hyperlipidemia: Secondary | ICD-10-CM

## 2024-04-05 DIAGNOSIS — M51362 Other intervertebral disc degeneration, lumbar region with discogenic back pain and lower extremity pain: Secondary | ICD-10-CM

## 2024-04-05 DIAGNOSIS — G8929 Other chronic pain: Secondary | ICD-10-CM

## 2024-04-05 DIAGNOSIS — Z8659 Personal history of other mental and behavioral disorders: Secondary | ICD-10-CM

## 2024-04-05 MED ORDER — VENLAFAXINE HCL ER 150 MG PO CP24: 150.0000 mg | ORAL_CAPSULE | Freq: Every day | ORAL | 3 refills | Status: DC

## 2024-04-05 MED ORDER — ARIPIPRAZOLE 20 MG PO TABS: 20.0000 mg | ORAL_TABLET | Freq: Every day | ORAL | 3 refills | Status: DC

## 2024-04-05 NOTE — Assessment & Plan Note (Signed)
 Referral placed to psychiatry Current regimen is infective: Abilify  20 mg once daily and Effexor  150 mg once daily

## 2024-04-05 NOTE — Progress Notes (Signed)
 Established Patient Office Visit   Subjective  Patient ID: Jaime [name], female    DOB: 03-Oct-1986  Age: 38 y.o. MRN: [id number]  Chief Complaint  Patient presents with   Referral    Needs to be referred back to physical therapy and she was supposed to have a sleep study but she has to reschedule. Also states she needs an EKG. The methadone  clinic needs it     She  has a past medical history of Acute encephalopathy (08/31/2020), Adjustment disorder with depressed mood (09/07/2020), Anxiety, Asthma, Chronic abdominal pain, Chronic back pain, Chronic prescription opiate use (02/16/2022), Cirrhosis (HCC), Cirrhosis (HCC), COPD (chronic obstructive pulmonary disease) (HCC), Depression, Diabetes mellitus without complication (HCC), Diabetes mellitus, type II (HCC), Diverticulosis, DVT (deep venous thrombosis) (HCC) (02/04/2023), Fatty liver (03/07/2020), GERD (gastroesophageal reflux disease), Hepatitis C, Hernia, abdominal, Hyperglycemia due to type 2 diabetes mellitus (HCC) (05/24/2020), Hyperlipidemia, IBS (irritable bowel syndrome) (02/16/2022), Insomnia, 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  HPI Patient presents to the clinic for follow up. For the details of today's visit, please refer to assessment and plan.   Review of Systems  Constitutional:  Negative for chills and fever.  Eyes:  Negative for blurred vision.  Respiratory:  Negative for shortness of breath.   Cardiovascular:  Negative for chest pain.  Musculoskeletal:  Positive for back pain, joint pain and myalgias.  Neurological:  Negative for dizziness and headaches.  Psychiatric/Behavioral:  Positive for depression. The patient has insomnia.       Objective:     BP 111/74   Pulse 90   Resp 16   Ht 5\' 10"  (1.778  m)   Wt 226 lb (102.5 kg)   SpO2 94%   BMI 32.43 kg/m  BP Readings from Last 3 Encounters:  04/05/24 111/74  03/06/24 104/60  02/14/24 101/64      Physical Exam Vitals reviewed.  Constitutional:      General: She is not in acute distress.    Appearance: Normal appearance. She is not ill-appearing, toxic-appearing or diaphoretic.  HENT:     Head: Normocephalic.  Eyes:     General:        Right eye: No discharge.        Left eye: No discharge.     Conjunctiva/sclera: Conjunctivae normal.  Cardiovascular:     Rate and Rhythm: Normal rate.     Pulses: Normal pulses.     Heart sounds: Normal heart sounds.  Pulmonary:     Effort: Pulmonary effort is normal. No respiratory distress.     Breath sounds: Normal breath sounds.  Musculoskeletal:     Lumbar back: Decreased range of motion. Positive right straight leg raise test and positive left straight leg raise test.  Skin:    General: Skin is warm and dry.     Capillary Refill: Capillary refill takes less than 2 seconds.  Neurological:     Mental Status: She is alert.  Psychiatric:        Mood and Affect: Mood normal.        Behavior: Behavior normal.      No results found for any visits on 04/05/24.  The ASCVD Risk score (Arnett DK, et al., 2019) failed to calculate for the following reasons:   The valid total cholesterol range is 130 to 320 mg/dL    Assessment &  Plan:  Abnormal EKG -     EKG 12-Lead  Mixed hyperlipidemia -     BMP8+eGFR -     CBC with Differential/Platelet -     Lipid panel  TSH (thyroid-stimulating hormone deficiency) -     TSH + free T4  Vitamin D  deficiency -     VITAMIN D  25 Hydroxy (Vit-D Deficiency, Fractures)  Degeneration of intervertebral disc of lumbar region with discogenic back pain and lower extremity pain -     Ambulatory referral to Physical Therapy  Hx of bipolar disorder Assessment & Plan: Referral placed to psychiatry Current regimen is infective: Abilify  20 mg once  daily and Effexor  150 mg once daily  Orders: -     Ambulatory referral to Psychiatry  Chronic midline low back pain with right-sided sciatica Assessment & Plan: Reviews Xray results with patient Referral placed to physical therapy Discussed to focus on maintaining good posture, using lumbar support while sitting, and avoiding prolonged sitting or heavy lifting. Engage in low-impact exercises like walking or swimming to strengthen core muscles and reduce strain on the spine. Apply heat or ice packs as needed for pain relief and follow up with physical therapy for targeted exercises.    Other orders -     ARIPiprazole ; Take 1 tablet (20 mg total) by mouth daily.  Dispense: 30 tablet; Refill: 3 -     Venlafaxine  HCl ER; Take 1 capsule (150 mg total) by mouth daily with breakfast.  Dispense: 30 capsule; Refill: 3    Return in about 6 months (around 10/05/2024), or if symptoms worsen or fail to improve, for chronic follow-up.   Avelino Lek Amber Bail, FNP

## 2024-04-05 NOTE — Patient Instructions (Addendum)
        Great to see you today.  I have refilled the medication(s) we provide.    Follow up with GI for Hernia: [facility] Gastroenterology at [facility] [address] [place], [place] [address] [phone]  If labs were collected, we will inform you of lab results once received either by echart message or telephone call.   - echart message- for normal results that have been seen by the patient already.   - telephone call: abnormal results or if patient has not viewed results in their echart.   - Please take medications as prescribed. - Follow up with your primary health provider if any health concerns arises. - If symptoms worsen please contact your primary care provider and/or visit the emergency department.   Can not take Seroquel  and methadone  together- Referral placed to Psychiatry    Seroquel  Medication Tapering Schedule   Week 1: Take regular dose every other day. Week 2: Take regular dose every third day. Week 3: Take regular dose every fourth day. Week 4: Take regular dose every fifth day. Week 5: Discontinue Seroquel   Medication completely.

## 2024-04-05 NOTE — Assessment & Plan Note (Signed)
 Reviews Xray results with patient Referral placed to physical therapy Discussed to focus on maintaining good posture, using lumbar support while sitting, and avoiding prolonged sitting or heavy lifting. Engage in low-impact exercises like walking or swimming to strengthen core muscles and reduce strain on the spine. Apply heat or ice packs as needed for pain relief and follow up with physical therapy for targeted exercises.

## 2024-04-06 ENCOUNTER — Encounter (HOSPITAL_COMMUNITY): Payer: MEDICAID

## 2024-04-06 ENCOUNTER — Ambulatory Visit: Payer: Self-pay | Admitting: Family Medicine

## 2024-04-06 LAB — LIPID PANEL
Chol/HDL Ratio: 3.5 ratio (ref 0.0–4.4)
Cholesterol, Total: 174 mg/dL (ref 100–199)
HDL: 50 mg/dL (ref >39)
LDL Chol Calc (NIH): 106 mg/dL — ABNORMAL HIGH (ref 0–99)
Triglycerides: 100 mg/dL (ref 0–149)
VLDL Cholesterol Cal: 18 mg/dL (ref 5–40)

## 2024-04-06 LAB — CBC WITH DIFFERENTIAL/PLATELET
Basophils Absolute: 0 10*3/uL (ref 0.0–0.2)
Basos: 1 %
EOS (ABSOLUTE): 0.7 10*3/uL — ABNORMAL HIGH (ref 0.0–0.4)
Eos: 14 %
Hematocrit: 38.6 % (ref 34.0–46.6)
Hemoglobin: 12.5 g/dL (ref 11.1–15.9)
Immature Grans (Abs): 0 10*3/uL (ref 0.0–0.1)
Immature Granulocytes: 0 %
Lymphocytes Absolute: 1 10*3/uL (ref 0.7–3.1)
Lymphs: 21 %
MCH: 29.5 pg (ref 26.6–33.0)
MCHC: 32.4 g/dL (ref 31.5–35.7)
MCV: 91 fL (ref 79–97)
Monocytes Absolute: 0.5 10*3/uL (ref 0.1–0.9)
Monocytes: 10 %
Neutrophils Absolute: 2.7 10*3/uL (ref 1.4–7.0)
Neutrophils: 54 %
Platelets: 143 10*3/uL — ABNORMAL LOW (ref 150–450)
RBC: 4.24 x10E6/uL (ref 3.77–5.28)
RDW: 14.6 % (ref 11.7–15.4)
WBC: 5 10*3/uL (ref 3.4–10.8)

## 2024-04-06 LAB — BMP8+EGFR
BUN/Creatinine Ratio: 10 (ref 9–23)
BUN: 7 mg/dL (ref 6–24)
CO2: 21 mmol/L (ref 20–29)
Calcium: 8.9 mg/dL (ref 8.7–10.2)
Chloride: 103 mmol/L (ref 96–106)
Creatinine, Ser: 0.67 mg/dL (ref 0.57–1.00)
Glucose: 97 mg/dL (ref 70–99)
Potassium: 4.2 mmol/L (ref 3.5–5.2)
Sodium: 140 mmol/L (ref 134–144)
eGFR: 113 mL/min/{1.73_m2} (ref >59)

## 2024-04-06 LAB — TSH+FREE T4
Free T4: 0.79 ng/dL — ABNORMAL LOW (ref 0.82–1.77)
TSH: 0.794 u[IU]/mL (ref 0.450–4.500)

## 2024-04-06 LAB — VITAMIN D 25 HYDROXY (VIT D DEFICIENCY, FRACTURES): Vit D, 25-Hydroxy: 22.4 ng/mL — ABNORMAL LOW (ref 30.0–100.0)

## 2024-04-10 ENCOUNTER — Ambulatory Visit
Admission: EM | Admit: 2024-04-10 | Discharge: 2024-04-10 | Disposition: A | Discharge status: Home / Self Care | Payer: MEDICAID | Attending: Family Medicine | Admitting: Family Medicine

## 2024-04-10 ENCOUNTER — Ambulatory Visit: Payer: Self-pay

## 2024-04-10 DIAGNOSIS — E119 Type 2 diabetes mellitus without complications: Secondary | ICD-10-CM

## 2024-04-10 DIAGNOSIS — M79605 Pain in left leg: Secondary | ICD-10-CM | POA: Diagnosis not present

## 2024-04-10 DIAGNOSIS — R21 Rash and other nonspecific skin eruption: Secondary | ICD-10-CM | POA: Diagnosis not present

## 2024-04-10 DIAGNOSIS — R600 Localized edema: Secondary | ICD-10-CM

## 2024-04-10 DIAGNOSIS — Z794 Long term (current) use of insulin: Secondary | ICD-10-CM

## 2024-04-10 MED ORDER — PREDNISONE 20 MG PO TABS: 20.0000 mg | ORAL_TABLET | Freq: Every day | ORAL | 0 refills | Status: DC

## 2024-04-10 MED ORDER — TRIAMCINOLONE ACETONIDE 0.1 % EX CREA: 1.0000 | TOPICAL_CREAM | Freq: Two times a day (BID) | CUTANEOUS | 0 refills | Status: DC

## 2024-04-10 NOTE — Telephone Encounter (Signed)
 Reason for Disposition . [1] Redness AND [2] painful when touched AND [3] no fever  Answer Assessment - Initial Assessment Questions 1. LOCATION: "Which ankle is swollen?" "Where is the swelling?"     L foot/ankle 2. ONSET: "When did the swelling start?"     3 days ago 3. SWELLING: "How bad is the swelling?" Or, "How large is it?" (e.g., mild, moderate, severe; size of localized swelling)    - NONE: No joint swelling.   - LOCALIZED: Localized; small area of puffy or swollen skin (e.g., insect bite, skin irritation).   - MILD: Joint looks or feels mildly swollen or puffy.   - MODERATE: Swollen; interferes with normal activities (e.g., work or school); decreased range of movement; may be limping.   - SEVERE: Very swollen; can't move swollen joint at all; limping a lot or unable to walk.     modearte 4. PAIN: "Is there any pain?" If Yes, ask: "How bad is it?" (Scale 1-10; or mild, moderate, severe)   - NONE (0): no pain.   - MILD (1-3): doesn't interfere with normal activities.    - MODERATE (4-7): interferes with normal activities (e.g., work or school) or awakens from sleep, limping.    - SEVERE (8-10): excruciating pain, unable to do any normal activities, unable to walk.      8/10 5. CAUSE: "What do you think caused the ankle swelling?"     Previous blood clot- 1-2 months ago, patient is taking blood thinner now 6. OTHER SYMPTOMS: "Do you have any other symptoms?" (e.g., fever, chest pain, difficulty breathing, calf pain)     no  Protocols used: Ankle Swelling-A-AH

## 2024-04-10 NOTE — Discharge Instructions (Signed)
 You may call the [facility] at [phone] during business hours tomorrow to schedule the left lower leg vascular ultrasound.  I have placed the order for this.  I have also sent in a steroid cream to use topically to the area and a low-dose of prednisone  as I suspect the swelling and redness to be inflammatory, possibly an allergic dermatitis.  Continue your Eliquis  for your recent blood clot and go to the emergency department for severe worsening symptoms at any time.  Follow-up with your primary care provider soon as possible.

## 2024-04-10 NOTE — Telephone Encounter (Signed)
 FYI Only or Action Required?: FYI only for provider  Patient was last seen in primary care on 04/05/2024 by Del Abron Abt, FNP. Called Nurse Triage reporting Foot Swelling. Symptoms began several days ago. Interventions attempted: Prescription medications: Patient unsure name- possible:Aaron Aas Symptoms are: apixaban  (ELIQUIS ) 5 MG TABS tablet .  Triage Disposition: See HCP Within 4 Hours (Or PCP Triage)  Patient/caregiver understands and will follow disposition?: UC advised - no office appointment open

## 2024-04-10 NOTE — ED Triage Notes (Signed)
 Pt reports swelling in the left and ankle x 5 days hx of blood clot in the left leg. Pain is present.

## 2024-04-10 NOTE — Telephone Encounter (Signed)
  1st attempt, no answer. Left voicemail for patient to return call for nurse triage.  Copied from CRM [id number]. Topic: Clinical - Medical Advice >> Apr 10, 2024 11:04 AM [employer] wrote: Reason for CRM: Patient has swelling in feet and ankles and wanted to know what else she could do. She has tried elevating them but it is not working. Last appointment was on 04/05/24  Callback #: [id number]

## 2024-04-10 NOTE — ED Provider Notes (Signed)
 [facility]    CSN: [id number] Arrival date & time: 04/10/24  1630      History   Chief Complaint No chief complaint on file.   HPI Jaime [name] is a 38 y.o. female.   Patient presenting today with 5-day history of left ankle redness, itching, swelling, pain.  Denies numbness, tingling, injury, new soaps or products used, decreased range of motion, chest pain, shortness of breath, palpitations.  States she was diagnosed with a DVT in this leg on 02/14/2024 and is currently on Eliquis  daily.  Has not missed any doses per patient.  So far not tried anything over-the-counter for symptoms but is very concerned about a recurrence of blood clot.    Past Medical History:  Diagnosis Date   Acute encephalopathy 08/31/2020   Adjustment disorder with depressed mood 09/07/2020   Anxiety    Asthma    Chronic abdominal pain    Chronic back pain    Chronic prescription opiate use 02/16/2022   Cirrhosis (HCC)    Cirrhosis (HCC)    COPD (chronic obstructive pulmonary disease) (HCC)    Depression    Diabetes mellitus without complication (HCC)    Diabetes mellitus, type II (HCC)    Diverticulosis    DVT (deep venous thrombosis) (HCC) 02/04/2023   taking lovenox  prior to procedure   Fatty liver 03/07/2020   GERD (gastroesophageal reflux disease)    Hepatitis C    Hernia, abdominal    Hyperglycemia due to type 2 diabetes mellitus (HCC) 05/24/2020   Hyperlipidemia    IBS (irritable bowel syndrome) 02/16/2022   Insomnia    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)    Lupus    Migraine headache    Morbid obesity (HCC) 05/24/2020   Neuropathy    Nocturnal seizures (HCC)    Pain of upper abdomen 07/28/2021   Peptic ulcer    Spleen enlarged    Splenomegaly     Patient Active Problem List   Diagnosis Date Noted   Hx of bipolar disorder 04/05/2024   Lumbar pain 12/09/2023   Cellulitis and abscess of buttock 10/22/2023   Type 2 diabetes mellitus  with other specified complication (HCC) 10/22/2023   Encounter for IUD insertion 10/06/2023   Hospital discharge follow-up 10/05/2023   Depression, major, single episode, severe (HCC) 10/05/2023   Acute pulmonary embolism (HCC) 09/25/2023   Elevated d-dimer 09/16/2023   Elevated brain natriuretic peptide (BNP) level 09/16/2023   Hypoalbuminemia due to protein-calorie malnutrition (HCC) 09/16/2023   Acute deep vein thrombosis (DVT) of left lower extremity (HCC) 09/15/2023   Left leg cellulitis 09/14/2023   Deep vein thrombophlebitis of left leg (HCC) 08/26/2023   Lumbar compression fracture (HCC) 02/10/2023   Left leg DVT (HCC) 02/05/2023   At risk for arrhythmia 01/26/2023   Chronic back pain 01/01/2023   Insomnia 01/01/2023   Depression, recurrent (HCC) 01/01/2023   Constipation 05/18/2022   IBS (irritable bowel syndrome) 02/16/2022   Chronic prescription opiate use 02/16/2022   Gastroesophageal reflux disease 10/23/2021   Nausea and vomiting 10/23/2021   Diarrhea 10/23/2021   Pain of upper abdomen 07/28/2021   Adjustment disorder with depressed mood 09/07/2020   Dyspnea 07/29/2020   Anasarca 05/24/2020   Hyperglycemia due to type 2 diabetes mellitus (HCC) 05/24/2020   Morbid obesity (HCC) 05/24/2020   Elevated serum immunoglobulin free light chains 03/08/2020   Fatty liver 03/07/2020   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   Splenomegaly 03/07/2020   Thrombocytopenia due to  hypersplenism 03/07/2020   Pressure injury of skin 01/29/2019   Uncontrolled diabetes mellitus 09/19/2018   Hyperglycemia 09/17/2018   Acute respiratory failure with hypoxia (HCC) 09/15/2018   Anxiety 09/15/2018   Chronic abdominal pain 09/15/2018   Diabetes mellitus without complication (HCC) 09/15/2018   Hepatitis C 09/15/2018   Peptic ulcer 09/15/2018   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) 09/15/2018   Cirrhosis (HCC) 09/15/2018   Tobacco use disorder 05/22/2018   CAP  (community acquired pneumonia) 07/17/2016   Chronic pain disorder 07/17/2016   Depression 05/24/2013    Past Surgical History:  Procedure Laterality Date   BIOPSY  11/25/2021   Procedure: BIOPSY;  Surgeon: Urban Garden, MD;  [facility]: [facility];  Service: Gastroenterology;;   CHOLECYSTECTOMY     COLONOSCOPY WITH PROPOFOL  N/A 11/25/2021   Procedure: COLONOSCOPY WITH PROPOFOL ;  Surgeon: Urban Garden, MD;  Location: [facility];  Service: Gastroenterology;  Laterality: N/A;  805   COLONOSCOPY WITH PROPOFOL  N/A 03/31/2022   Procedure: COLONOSCOPY WITH PROPOFOL ;  Surgeon: Urban Garden, MD;  Location: [facility];  Service: Gastroenterology;  Laterality: N/A;  730   ESOPHAGOGASTRODUODENOSCOPY  06/2020   done at [facility], candida in upper esophagus (treated with diflucan ), ulcerative esophagitis at GE junction, gastritis in stomach, single ulcer in duodenal bulb, with duodenal mucosa showing no abnormality. No presence of varices   ESOPHAGOGASTRODUODENOSCOPY (EGD) WITH PROPOFOL  N/A 11/25/2021   Procedure: ESOPHAGOGASTRODUODENOSCOPY (EGD) WITH PROPOFOL ;  Surgeon: Urban Garden, MD;  [facility]: [facility];  Service: Gastroenterology;  Laterality: N/A;   ESOPHAGOGASTRODUODENOSCOPY (EGD) WITH PROPOFOL  N/A 03/31/2022   Procedure: ESOPHAGOGASTRODUODENOSCOPY (EGD) WITH PROPOFOL ;  Surgeon: Urban Garden, MD;  Location: [facility];  Service: Gastroenterology;  Laterality: N/A;   IVC FILTER REMOVAL N/A 09/16/2023   Procedure: IVC FILTER REMOVAL;  Surgeon: Philipp Brawn, MD;  Location: [facility] CV LAB;  Service: Cardiovascular;  Laterality: N/A;   IVC FILTER REMOVAL N/A 02/14/2024   Procedure: IVC FILTER REMOVAL;  Surgeon: Philipp Brawn, MD;  Location: [facility] INVASIVE CV LAB;  Service: Cardiovascular;  Laterality: N/A;   PERIPHERAL VASCULAR THROMBECTOMY Left 09/16/2023   Procedure: PERIPHERAL VASCULAR THROMBECTOMY;  Surgeon: Philipp Brawn, MD;  Location: [facility] CV LAB;  Service: Cardiovascular;  Laterality: Left;    OB History     Gravida  0   Para  0   Term  0   Preterm  0   AB  0   Living  0      SAB  0   IAB  0   Ectopic  0   Multiple  0   Live Births  0            Home Medications    Prior to Admission medications   Medication Sig Start Date End Date Taking? Authorizing Provider  predniSONE  (DELTASONE ) 20 MG tablet Take 1 tablet (20 mg total) by mouth daily with breakfast. 04/10/24  Yes Corbin Dess, PA-C  triamcinolone cream (KENALOG) 0.1 % Apply 1 Application topically 2 (two) times daily. 04/10/24  Yes Corbin Dess, PA-C  acetaminophen  (TYLENOL ) 500 MG tablet Take 1,000 mg by mouth 2 (two) times daily as needed for moderate pain (pain score 4-6) or headache.    [provider]  albuterol  (VENTOLIN  HFA) 108 (90 Base) MCG/ACT inhaler Inhale 2 puffs into the lungs every 6 (six) hours as needed for wheezing or shortness of breath. 07/17/22  Leath-Warren, Belen Bowers, NP  apixaban  (ELIQUIS ) 5 MG TABS tablet Take 2 tablets (10 mg total) by mouth 2 (two) times daily for 6 days, THEN 1 tablet (5 mg total) 2 (two) times daily. Patient taking differently: 1 tablet (5 mg total) 2 (two) times daily. 09/28/23 04/01/24  Justina Oman, MD  ARIPiprazole  (ABILIFY ) 20 MG tablet Take 1 tablet (20 mg total) by mouth daily. 04/05/24   Del Orbe Polanco, Iliana, FNP  clonazePAM  (KLONOPIN ) 0.5 MG tablet Take 0.5 mg by mouth 2 (two) times daily as needed for anxiety. 08/02/23   [provider]  Continuous Glucose Receiver (FREESTYLE LIBRE 3 READER) DEVI Use the Freestyle Libre 3 Reader Device to monitor blood sugars as directed by the provider. 02/29/24   Wendel Hals, NP  Continuous Glucose Sensor (DEXCOM G7 SENSOR) MISC Inject 1 Application into the skin as directed. Change sensor every 10 days as directed. 11/04/23   Wendel Hals, NP  Continuous Glucose Sensor  (FREESTYLE LIBRE 3 PLUS SENSOR) MISC 2 each by Does not apply route as directed. Change sensor every 15 days. 02/29/24   Wendel Hals, NP  Continuous Glucose Transmitter (DEXCOM G6 TRANSMITTER) MISC USE AS DIRECTED. 03/24/23   Wendel Hals, NP  famotidine  (PEPCID ) 20 MG tablet Take 1 tablet (20 mg total) by mouth at bedtime. 09/28/23   Justina Oman, MD  ferrous sulfate  325 (65 FE) MG EC tablet Take 1 tablet (325 mg total) by mouth daily with breakfast. 09/13/23   Sheril Dines M, PA-C  glucose blood (ACCU-CHEK GUIDE) test strip Use as instructed to check blood glucose four times daily 12/01/21   Wendel Hals, NP  hydrOXYzine  (VISTARIL ) 50 MG capsule Take 1 capsule (50 mg total) by mouth 3 (three) times daily as needed. 05/25/23   Del Orbe Polanco, Iliana, FNP  hyoscyamine  (ANASPAZ ) 0.125 MG TBDP disintergrating tablet Place 1 tablet (0.125 mg total) under the tongue every 6 (six) hours as needed. 12/13/23   Castaneda Mayorga, Daniel, MD  Insulin  Pen Needle (PEN NEEDLES) 32G X 6 MM MISC 1 each by Does not apply route 3 (three) times daily. 02/26/21   Wendel Hals, NP  insulin  regular human CONCENTRATED (HUMULIN  R U-500 KWIKPEN) 500 UNIT/ML KwikPen Inject 80 Units into the skin 3 (three) times daily with meals. 03/06/24   Wendel Hals, NP  lidocaine  (XYLOCAINE ) 5 % ointment Apply 1 Application topically as needed. Patient taking differently: Apply 1 Application topically 2 (two) times daily as needed (pain). 04/27/23   Del Orbe Polanco, Iliana, FNP  lisinopril  (ZESTRIL ) 5 MG tablet Take 5 mg by mouth daily.    [provider]  metFORMIN  (GLUCOPHAGE ) 1000 MG tablet Take 1 tablet (1,000 mg total) by mouth 2 (two) times daily with a meal. take 1 tablet by mouth 2 times a day with a meal. 11/04/23   Wendel Hals, NP  methadone  (DOLOPHINE ) 10 MG/ML solution Take 105 mg by mouth daily.    [provider]  methocarbamol (ROBAXIN) 750 MG tablet Take 750 mg by  mouth every 6 (six) hours as needed for muscle spasms.    [provider]  nicotine  (NICODERM CQ  - DOSED IN MG/24 HOURS) 21 mg/24hr patch Place 1 patch (21 mg total) onto the skin daily. 09/29/23   Justina Oman, MD  nystatin  cream (MYCOSTATIN ) Apply 1 Application topically 2 (two) times daily. 10/22/23   Towanda Fret, MD  Omega-3 Fatty Acids (ENTERIC FISH OIL) 1000 MG CPDR Take  1,000 mg by mouth 3 (three) times daily.    [provider]  omeprazole  (PRILOSEC) 40 MG capsule TAKE 1 CAPSULE BY MOUTH TWICE DAILY. 05/10/23   Carlan, Chelsea L, NP  ondansetron  (ZOFRAN ) 4 MG tablet Take 1 tablet (4 mg total) by mouth every 8 (eight) hours as needed for nausea or vomiting. 03/08/23   Carlan, Chelsea L, NP  Oxycodone  HCl 10 MG TABS Take 1 tablet (10 mg total) by mouth every 6 (six) hours as needed (pain). 09/28/23   Justina Oman, MD  pantoprazole  (PROTONIX ) 20 MG tablet Take 20 mg by mouth daily. 12/30/23   [provider]  potassium chloride  (KLOR-CON ) 10 MEQ tablet Take 10 mEq by mouth 2 (two) times daily. 08/10/23   [provider]  propranolol  (INDERAL ) 10 MG tablet Take 1 tablet (10 mg total) by mouth 2 (two) times daily. 09/28/23   Justina Oman, MD  tirzepatide  (MOUNJARO ) 7.5 MG/0.5ML Pen Inject 7.5 mg into the skin once a week. 03/06/24   Wendel Hals, NP  torsemide  (DEMADEX ) 20 MG tablet Take 2 tablets (40 mg total) by mouth daily. 09/28/23   Justina Oman, MD  venlafaxine  XR (EFFEXOR  XR) 150 MG 24 hr capsule Take 1 capsule (150 mg total) by mouth daily with breakfast. 04/05/24   Del Abron Abt, FNP    Family History Family History  Problem Relation Age of Onset   Hyperlipidemia Maternal Grandfather     Social History Social History   Tobacco Use   Smoking status: Every Day    Current packs/day: 0.50    Types: Cigarettes    Passive exposure: Current   Smokeless tobacco: Never  Vaping Use   Vaping status: Every Day   Substances:  Nicotine , Flavoring  Substance Use Topics   Alcohol use: No   Drug use: No     Allergies   Firvanq  [vancomycin ], Iodine -131, Ivp dye [iodinated contrast media], Toradol [ketorolac tromethamine], Tylenol  [acetaminophen ], Neurontin  [gabapentin ], Nsaids, and Suboxone  [buprenorphine hcl-naloxone  hcl]   Review of Systems Review of Systems Per HPI  Physical Exam Triage Vital Signs ED Triage Vitals  Encounter Vitals Group     BP 04/10/24 1656 127/79     Systolic BP Percentile --      Diastolic BP Percentile --      Pulse Rate 04/10/24 1656 94     Resp 04/10/24 1656 18     Temp 04/10/24 1656 98.5 F (36.9 C)     Temp Source 04/10/24 1656 Oral     SpO2 04/10/24 1656 95 %     Weight --      Height --      Head Circumference --      Peak Flow --      Pain Score 04/10/24 1704 8     Pain Loc --      Pain Education --      Exclude from Growth Chart --    No data found.  Updated Vital Signs BP 127/79 (BP Location: Right Arm)   Pulse 94   Temp 98.5 F (36.9 C) (Oral)   Resp 18   SpO2 95%   Visual Acuity Right Eye Distance:   Left Eye Distance:   Bilateral Distance:    Right Eye Near:   Left Eye Near:    Bilateral Near:     Physical Exam Vitals and nursing note reviewed.  Constitutional:      Appearance: Normal appearance. She is not ill-appearing.  HENT:  Head: Atraumatic.  Eyes:     Extraocular Movements: Extraocular movements intact.     Conjunctiva/sclera: Conjunctivae normal.  Cardiovascular:     Rate and Rhythm: Normal rate and regular rhythm.     Heart sounds: Normal heart sounds.  Pulmonary:     Effort: Pulmonary effort is normal.     Breath sounds: Normal breath sounds.  Musculoskeletal:        General: Swelling and tenderness present. Normal range of motion.     Cervical back: Normal range of motion and neck supple.     Comments: Negative Homans' sign and squeeze test.  Diffuse edema to the lower aspect of the left lower leg, mildly tender to  palpation throughout  Skin:    General: Skin is warm and dry.     Findings: Erythema and rash present.     Comments: Erythematous maculopapular plaque to the anterior left lower leg with some extension of redness and rash above and below, excoriation marks from scratching to this area  Neurological:     Mental Status: She is alert and oriented to person, place, and time.     Motor: No weakness.     Gait: Gait normal.     Comments: Left lower extremity appears neurovascularly intact  Psychiatric:        Mood and Affect: Mood normal.        Thought Content: Thought content normal.        Judgment: Judgment normal.      UC Treatments / Results  Labs (all labs ordered are listed, but only abnormal results are displayed) Labs Reviewed - No data to display  EKG   Radiology No results found.  Procedures Procedures (including critical care time)  Medications Ordered in UC Medications - No data to display  Initial Impression / Assessment and Plan / UC Course  I have reviewed the triage vital signs and the nursing notes.  Pertinent labs & imaging results that were available during my care of the patient were reviewed by me and considered in my medical decision making (see chart for details).     Vital signs and exam overall reassuring today, suspect an allergic dermatitis given the appearance of superficial skin changes so we will treat with triamcinolone, low-dose prednisone  keeping in mind her blood sugar levels which she states have been ranging around 110 recently according to her continuous glucose monitor.  Discussed leg elevation and compression additionally.  DVT rule out ultrasound also ordered with information on how to call to schedule this for reassurance though low suspicion at this time as she is currently on Eliquis  and symptoms appear more superficial in nature.  ED for worsening symptoms at any time.  Final Clinical Impressions(s) / UC Diagnoses   Final diagnoses:   Lower leg edema  Rash and nonspecific skin eruption  Left leg pain  Type 2 diabetes mellitus without complication, with long-term current use of insulin  [facility])     Discharge Instructions      You may call the [facility] imaging center at [phone] during business hours tomorrow to schedule the left lower leg vascular ultrasound.  I have placed the order for this.  I have also sent in a steroid cream to use topically to the area and a low-dose of prednisone  as I suspect the swelling and redness to be inflammatory, possibly an allergic dermatitis.  Continue your Eliquis  for your recent blood clot and go to the emergency department for severe worsening symptoms at any  time.  Follow-up with your primary care provider soon as possible.  ED Prescriptions     Medication Sig Dispense Auth. Provider   triamcinolone cream (KENALOG) 0.1 % Apply 1 Application topically 2 (two) times daily. 60 g Corbin Dess, PA-C   predniSONE  (DELTASONE ) 20 MG tablet Take 1 tablet (20 mg total) by mouth daily with breakfast. 5 tablet Corbin Dess, [place]      PDMP not reviewed this encounter.   Corbin Dess, [place] 04/10/24 1757

## 2024-04-11 ENCOUNTER — Other Ambulatory Visit: Payer: Self-pay

## 2024-04-11 ENCOUNTER — Ambulatory Visit: Payer: Self-pay

## 2024-04-11 ENCOUNTER — Emergency Department (HOSPITAL_COMMUNITY)
Admission: EM | Admit: 2024-04-11 | Discharge: 2024-04-11 | Disposition: A | Discharge status: Home / Self Care | Payer: MEDICAID | Attending: Emergency Medicine | Admitting: Emergency Medicine

## 2024-04-11 ENCOUNTER — Ambulatory Visit (HOSPITAL_COMMUNITY)
Admission: RE | Admit: 2024-04-11 | Discharge: 2024-04-11 | Disposition: A | Discharge status: Home / Self Care | Payer: MEDICAID | Source: Ambulatory Visit | Attending: Family Medicine | Admitting: Family Medicine

## 2024-04-11 ENCOUNTER — Other Ambulatory Visit: Payer: Self-pay | Admitting: Family Medicine

## 2024-04-11 ENCOUNTER — Telehealth: Payer: Self-pay | Admitting: Family Medicine

## 2024-04-11 ENCOUNTER — Ambulatory Visit: Payer: Self-pay | Admitting: Family Medicine

## 2024-04-11 ENCOUNTER — Encounter (HOSPITAL_COMMUNITY): Payer: Self-pay

## 2024-04-11 DIAGNOSIS — Z7984 Long term (current) use of oral hypoglycemic drugs: Secondary | ICD-10-CM | POA: Diagnosis not present

## 2024-04-11 DIAGNOSIS — M79605 Pain in left leg: Secondary | ICD-10-CM

## 2024-04-11 DIAGNOSIS — L03116 Cellulitis of left lower limb: Secondary | ICD-10-CM | POA: Diagnosis not present

## 2024-04-11 DIAGNOSIS — M79662 Pain in left lower leg: Secondary | ICD-10-CM | POA: Diagnosis present

## 2024-04-11 DIAGNOSIS — E1140 Type 2 diabetes mellitus with diabetic neuropathy, unspecified: Secondary | ICD-10-CM | POA: Insufficient documentation

## 2024-04-11 DIAGNOSIS — Z7901 Long term (current) use of anticoagulants: Secondary | ICD-10-CM | POA: Insufficient documentation

## 2024-04-11 DIAGNOSIS — Z794 Long term (current) use of insulin: Secondary | ICD-10-CM | POA: Insufficient documentation

## 2024-04-11 DIAGNOSIS — J4489 Other specified chronic obstructive pulmonary disease: Secondary | ICD-10-CM | POA: Diagnosis not present

## 2024-04-11 MED ORDER — SULFAMETHOXAZOLE-TRIMETHOPRIM 800-160 MG PO TABS: 1.0000 | ORAL_TABLET | Freq: Two times a day (BID) | ORAL | 0 refills | Status: AC

## 2024-04-11 MED ORDER — SULFAMETHOXAZOLE-TRIMETHOPRIM 800-160 MG PO TABS
1.0000 | ORAL_TABLET | Freq: Once | ORAL | Status: AC
  Administered 2024-04-11: 1 via ORAL
  Filled 2024-04-11: qty 1

## 2024-04-11 NOTE — Telephone Encounter (Signed)
 ED VISIT

## 2024-04-11 NOTE — Telephone Encounter (Signed)
 DVT order replaced as prior ordered discontinued

## 2024-04-11 NOTE — Telephone Encounter (Signed)
 Pt calling from the ED, states that she would like an appt for tomorrow for her swollen leg that she "was told has a blood clot". Routing to clinic for scheduling.   Copied from CRM [id number]. Topic: Clinical - Red Word Triage >> Apr 11, 2024  5:22 PM Jaime [name] wrote: Pt has swelling in her leg, which has confirmed blood clots Pt interested in an appointment tomorrow

## 2024-04-11 NOTE — Telephone Encounter (Signed)
 Patient is at the ED

## 2024-04-11 NOTE — ED Provider Notes (Signed)
 [place] EMERGENCY DEPARTMENT AT [facility] Provider Note   CSN: [id number] Arrival date & time: 04/11/24  1601     History  Chief Complaint  Patient presents with   Leg Swelling    Jaime [name] is a 38 y.o. 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  She was seen at a urgent care center yesterday for complaints of a patch of redness along with itching but also tenderness of her this extremity anteriorly.  She endorses swelling and pain at the site, her symptoms started approximately 6 days ago.  She has no chest pain, shortness of breath or palpitations.  Also denies fevers or chills.  She was placed on low-dose prednisone  along with steroid topical cream but also ordered ultrasound which she had completed today.  Ultrasound results are negative for acute or worsening DVT, she does have chronic residual clot without complete obstruction, appears stable. Denies injury to this skin site, she describes tenderness with mild itching as well.    The history is provided by the patient.       Home Medications Prior to Admission medications   Medication Sig Start Date End Date Taking? Authorizing Provider  predniSONE  (DELTASONE ) 20 MG tablet Take 1 tablet (20 mg total) by mouth daily with breakfast. 04/10/24  Yes Corbin Dess, PA-C  sulfamethoxazole-trimethoprim (BACTRIM DS) 800-160 MG tablet Take 1 tablet by mouth 2 (two) times daily for 7 days. 04/11/24 04/18/24 Yes Zayvon Alicea, PA-C  acetaminophen  (TYLENOL ) 500 MG tablet Take 1,000 mg by mouth 2 (two) times daily as needed for moderate pain (pain score 4-6) or headache.    [provider]  albuterol  (VENTOLIN  HFA) 108 (90 Base) MCG/ACT inhaler Inhale 2 puffs into the lungs every 6 (six) hours as  needed for wheezing or shortness of breath. 07/17/22   Leath-Warren, Belen Bowers, NP  apixaban  (ELIQUIS ) 5 MG TABS tablet Take 2 tablets (10 mg total) by mouth 2 (two) times daily for 6 days, THEN 1 tablet (5 mg total) 2 (two) times daily. Patient taking differently: 1 tablet (5 mg total) 2 (two) times daily. 09/28/23 04/01/24  Justina Oman, MD  ARIPiprazole  (ABILIFY ) 20 MG tablet Take 1 tablet (20 mg total) by mouth daily. 04/05/24   Del Abron Abt, FNP  clonazePAM  (KLONOPIN ) 0.5 MG tablet Take 0.5 mg by mouth 2 (two) times daily as needed for anxiety. 08/02/23   [provider]  Continuous Glucose Receiver (FREESTYLE LIBRE 3 READER) DEVI Use the Freestyle Libre 3 Reader Device to monitor blood sugars as directed by the provider. 02/29/24   Wendel Hals, NP  Continuous Glucose Sensor (DEXCOM G7 SENSOR) MISC Inject 1 Application into the skin as directed. Change sensor every 10 days as directed. 11/04/23   Wendel Hals, NP  Continuous Glucose Sensor (FREESTYLE LIBRE 3 PLUS SENSOR) MISC 2 each by Does not apply route as directed. Change sensor every 15 days. 02/29/24   Wendel Hals, NP  Continuous Glucose Transmitter (DEXCOM G6 TRANSMITTER) MISC USE AS DIRECTED. 03/24/23   Wendel Hals, NP  famotidine  (PEPCID ) 20 MG tablet Take 1 tablet (20 mg total) by mouth at bedtime. 09/28/23   Justina Oman, MD  ferrous sulfate  325 (65 FE) MG EC tablet Take 1 tablet (325 mg total)  by mouth daily with breakfast. 09/13/23   Sheril Dines M, PA-C  glucose blood (ACCU-CHEK GUIDE) test strip Use as instructed to check blood glucose four times daily 12/01/21   Wendel Hals, NP  hydrOXYzine  (VISTARIL ) 50 MG capsule Take 1 capsule (50 mg total) by mouth 3 (three) times daily as needed. 05/25/23   Del Orbe Polanco, Iliana, FNP  hyoscyamine  (ANASPAZ ) 0.125 MG TBDP disintergrating tablet Place 1 tablet (0.125 mg total) under the tongue every 6 (six) hours as needed. 12/13/23    Urban Garden, MD  Insulin  Pen Needle (PEN NEEDLES) 32G X 6 MM MISC 1 each by Does not apply route 3 (three) times daily. 02/26/21   Wendel Hals, NP  insulin  regular human CONCENTRATED (HUMULIN  R U-500 KWIKPEN) 500 UNIT/ML KwikPen Inject 80 Units into the skin 3 (three) times daily with meals. 03/06/24   Wendel Hals, NP  lidocaine  (XYLOCAINE ) 5 % ointment Apply 1 Application topically as needed. Patient taking differently: Apply 1 Application topically 2 (two) times daily as needed (pain). 04/27/23   Del Orbe Polanco, Iliana, FNP  lisinopril  (ZESTRIL ) 5 MG tablet Take 5 mg by mouth daily.    [provider]  metFORMIN  (GLUCOPHAGE ) 1000 MG tablet Take 1 tablet (1,000 mg total) by mouth 2 (two) times daily with a meal. take 1 tablet by mouth 2 times a day with a meal. 11/04/23   Wendel Hals, NP  methadone  (DOLOPHINE ) 10 MG/ML solution Take 105 mg by mouth daily.    [provider]  methocarbamol (ROBAXIN) 750 MG tablet Take 750 mg by mouth every 6 (six) hours as needed for muscle spasms.    [provider]  NARCAN  4 MG/0.1ML LIQD nasal spray kit Place 1 spray into the nose once as needed (OD). 04/07/24   [provider]  nicotine  (NICODERM CQ  - DOSED IN MG/24 HOURS) 21 mg/24hr patch Place 1 patch (21 mg total) onto the skin daily. 09/29/23   Justina Oman, MD  nystatin  cream (MYCOSTATIN ) Apply 1 Application topically 2 (two) times daily. 10/22/23   Towanda Fret, MD  Omega-3 Fatty Acids (ENTERIC FISH OIL) 1000 MG CPDR Take 1,000 mg by mouth 3 (three) times daily.    [provider]  omeprazole  (PRILOSEC) 40 MG capsule TAKE 1 CAPSULE BY MOUTH TWICE DAILY. 05/10/23   Carlan, Chelsea L, NP  ondansetron  (ZOFRAN ) 4 MG tablet Take 1 tablet (4 mg total) by mouth every 8 (eight) hours as needed for nausea or vomiting. 03/08/23   Carlan, Chelsea L, NP  Oxycodone  HCl 10 MG TABS Take 1 tablet (10 mg total) by mouth every 6 (six) hours as  needed (pain). 09/28/23   Justina Oman, MD  pantoprazole  (PROTONIX ) 20 MG tablet Take 20 mg by mouth daily. 12/30/23   [provider]  potassium chloride  (KLOR-CON ) 10 MEQ tablet Take 10 mEq by mouth 2 (two) times daily. 08/10/23   [provider]  propranolol  (INDERAL ) 10 MG tablet Take 1 tablet (10 mg total) by mouth 2 (two) times daily. 09/28/23   Justina Oman, MD  QUEtiapine  (SEROQUEL ) 25 MG tablet Take 25 mg by mouth at bedtime. 03/07/24   [provider]  tirzepatide  (MOUNJARO ) 7.5 MG/0.5ML Pen Inject 7.5 mg into the skin once a week. 03/06/24   Wendel Hals, NP  torsemide  (DEMADEX ) 20 MG tablet Take 2 tablets (40 mg total) by mouth daily. 09/28/23   Justina Oman, MD  triamcinolone cream (KENALOG) 0.1 % Apply 1  Application topically 2 (two) times daily. 04/10/24   Corbin Dess, PA-C  venlafaxine  XR (EFFEXOR  XR) 150 MG 24 hr capsule Take 1 capsule (150 mg total) by mouth daily with breakfast. 04/05/24   Del Orbe Polanco, Iliana, FNP      Allergies    Firvanq  [vancomycin ], Iodine -131, Ivp dye [iodinated contrast media], Toradol [ketorolac tromethamine], Tylenol  [acetaminophen ], Nsaids, Suboxone  [buprenorphine hcl-naloxone  hcl], and Neurontin  [gabapentin ]    Review of Systems   Review of Systems  Constitutional:  Negative for chills and fever.  Respiratory:  Negative for shortness of breath and wheezing.   Skin:  Positive for color change.  Neurological:  Negative for numbness.    Physical Exam Updated Vital Signs BP 121/78 (BP Location: Right Arm)   Pulse 92   Temp 98.3 F (36.8 C) (Oral)   Resp 18   Ht 5\' 10"  (1.778 m)   Wt 100.2 kg   SpO2 97%   BMI 31.71 kg/m  Physical Exam Constitutional:      General: She is not in acute distress.    Appearance: She is well-developed.  HENT:     Head: Normocephalic.  Cardiovascular:     Rate and Rhythm: Normal rate.     Pulses:          Dorsalis pedis pulses are 2+ on the right side and 2+ on  the left side.  Pulmonary:     Effort: Pulmonary effort is normal.     Breath sounds: No wheezing.  Musculoskeletal:        General: Normal range of motion.     Cervical back: Neck supple.  Skin:    Findings: Erythema present.     Comments: Approx 8cm macular erythematous lesion left anterior tibia,  blanching without red streaking.  Excoriations present.  No induration, slight increased warmth.      ED Results / Procedures / Treatments   Labs (all labs ordered are listed, but only abnormal results are displayed) Labs Reviewed - No data to display  EKG None  Radiology US  Venous Img Lower Unilateral Left (DVT) Result Date: 04/11/2024 CLINICAL DATA:  Chronic left leg DVT, on Eliquis  with redness in the left ankle in foot for 5 days. Patient has a known IVC filter. EXAM: Left LOWER EXTREMITY VENOUS DOPPLER ULTRASOUND TECHNIQUE: Gray-scale sonography with graded compression, as well as color Doppler and duplex ultrasound were performed to evaluate the lower extremity deep venous systems from the level of the common femoral vein and including the common femoral, femoral, profunda femoral, popliteal and calf veins including the posterior tibial, peroneal and gastrocnemius veins when visible. The superficial great saphenous vein was also interrogated. Spectral Doppler was utilized to evaluate flow at rest and with distal augmentation maneuvers in the common femoral, femoral and popliteal veins. COMPARISON:  Comparison ultrasound 09/26/2023.  Older exams as well FINDINGS: Contralateral Common Femoral Vein: Respiratory phasicity is normal and symmetric with the symptomatic side. No evidence of thrombus. Normal compressibility. Common Femoral Vein: No evidence of thrombus. Normal compressibility, respiratory phasicity and response to augmentation. Previous thrombus no longer seen. Saphenofemoral Junction: No evidence of thrombus. Normal compressibility and flow on color Doppler imaging. Profunda Femoral  Vein: There is some echogenic linear material along deep femoral vein with some flow on color Doppler. Nonocclusive thrombus. This is improved from previous. Femoral Vein: Previous thrombus all along the femoral vein has shown improvement. Some residual proximal linear echogenic material. Flow seen on color Doppler. Popliteal Vein: Incomplete compression with some restored  flow. Residual thrombus appears more echogenic. Overall improved from previous. Calf Veins: No evidence of thrombus. Normal compressibility and flow on color Doppler imaging. Superficial Great Saphenous Vein: No evidence of thrombus. Normal compressibility. Venous Reflux:  None. Other Findings:  None. IMPRESSION: Interval improvement in distribution and extent of thrombus. Some residual echogenic areas along the femoral vein, deep femoral vein and popliteal vein. Residual thrombus appears more chronic and is nonocclusive. Electronically Signed   By: Adrianna Horde M.D.   On: 04/11/2024 13:03    Procedures Procedures    Medications Ordered in ED Medications  sulfamethoxazole-trimethoprim (BACTRIM DS) 800-160 MG per tablet 1 tablet (has no administration in time range)    ED Course/ Medical Decision Making/ A&P                                 Medical Decision Making Pt with tender erythema left anterior tibia,  same leg that has chronic dvt, US  reassuring.  Has been on steroids, oral and topical with no improvement (since ytd),  could be too early for prednisone  to be effective,  therefore pt advised to continue abx. Will cover for possible developing cellulitis, esp given tenderness.    Risk Prescription drug management.           Final Clinical Impression(s) / ED Diagnoses Final diagnoses:  Cellulitis of left lower extremity    Rx / DC Orders ED Discharge Orders          Ordered    sulfamethoxazole-trimethoprim (BACTRIM DS) 800-160 MG tablet  2 times daily        04/11/24 1659              Uriel Horkey,  Zalea [name], PA-C 04/11/24 1734

## 2024-04-11 NOTE — Discharge Instructions (Addendum)
 You are being treated for a possible cellulitis (skin infection) with the antibiotics prescribed.  I would also complete the steroids you were prescribed yesterday as well. Your US  shows that your blood clot is improved from prior imaging which is reassuring.  Take the antibiotics prescribed, also using a gentle heating pad at this site of redness for 20 minutes 3-4 times daily may also help heal this quicker.

## 2024-04-11 NOTE — ED Triage Notes (Signed)
 Pt arrived via POV from home c/o left leg swelling X 5 days. Pt reports having an US  Study performed today and reports previous Hx of a blood clot in the leg as well. Pt reports going to UC and was given steroids. Pt reports left leg is painful and denies injury.

## 2024-04-12 ENCOUNTER — Ambulatory Visit (INDEPENDENT_AMBULATORY_CARE_PROVIDER_SITE_OTHER): Payer: MEDICAID | Admitting: Family Medicine

## 2024-04-12 ENCOUNTER — Encounter: Payer: Self-pay | Admitting: Family Medicine

## 2024-04-12 VITALS — BP 128/80 | HR 91 | Resp 16 | Ht 70.0 in | Wt 214.0 lb

## 2024-04-12 DIAGNOSIS — L03116 Cellulitis of left lower limb: Secondary | ICD-10-CM

## 2024-04-12 DIAGNOSIS — Z09 Encounter for follow-up examination after completed treatment for conditions other than malignant neoplasm: Secondary | ICD-10-CM | POA: Diagnosis not present

## 2024-04-12 DIAGNOSIS — F419 Anxiety disorder, unspecified: Secondary | ICD-10-CM

## 2024-04-12 DIAGNOSIS — F322 Major depressive disorder, single episode, severe without psychotic features: Secondary | ICD-10-CM

## 2024-04-12 DIAGNOSIS — M79605 Pain in left leg: Secondary | ICD-10-CM

## 2024-04-12 MED ORDER — LIDOCAINE 5 % EX PTCH: 1.0000 | MEDICATED_PATCH | CUTANEOUS | 0 refills | Status: DC

## 2024-04-12 NOTE — Telephone Encounter (Signed)
 Pt seen in ER on 6/10 Lvm to call and schedule ER follow up with office

## 2024-04-12 NOTE — Patient Instructions (Addendum)
 F/u WITH PCP, ILIANA IN 3 TO 5 WEEKS  LIDODERM  PATCH IS PRESCRIBED , DO Not APPLY TO INFLAMED SKIN  TAKE ENTIRE COURSE OF ANTIBIOTIC SEPTRA THAT IS PRESCRIBED  PLEASE WORK ON QUITTING SMOKING  Thanks for choosing [facility], we consider it a privelige to serve you.

## 2024-04-12 NOTE — Telephone Encounter (Signed)
 Pt wants to know if she should come in and follow-up with you today?

## 2024-04-13 ENCOUNTER — Other Ambulatory Visit (HOSPITAL_COMMUNITY): Payer: Self-pay

## 2024-04-14 ENCOUNTER — Other Ambulatory Visit (HOSPITAL_COMMUNITY): Payer: Self-pay

## 2024-04-14 ENCOUNTER — Telehealth: Payer: Self-pay | Admitting: Pharmacy Technician

## 2024-04-14 NOTE — Telephone Encounter (Signed)
 Pharmacy Patient Advocate Encounter  Received notification from [employer] that Prior Authorization for Lidocaine  5% patches has been DENIED.  Full denial letter will be uploaded to the media tab. See denial reason below.   PA #/Case ID/Reference #: [id number]  4% patches are available OTC.

## 2024-04-17 ENCOUNTER — Other Ambulatory Visit (HOSPITAL_COMMUNITY): Payer: Self-pay

## 2024-04-18 ENCOUNTER — Telehealth: Payer: Self-pay | Admitting: Family Medicine

## 2024-04-18 NOTE — Telephone Encounter (Signed)
 Copied from CRM [id number]. Topic: Referral - Status >> Apr 18, 2024 10:42 AM Antwanette L wrote: Reason for CRM: Patient is calling to get an update on her referral to see a psychiatrist.  I did let the patient know the referral is pending. Patient is requesting a call back at [phone]

## 2024-04-18 NOTE — Telephone Encounter (Signed)
 Referral sent to: [facility] at [facility] [address], [place] [address] [phone]   I have sent MyChart Message to Patient with Specialty Office contact information.

## 2024-05-10 ENCOUNTER — Telehealth: Payer: Self-pay

## 2024-05-10 NOTE — Telephone Encounter (Signed)
 Spoke with pt for prechart, no cpap no sleep study

## 2024-05-11 ENCOUNTER — Encounter: Payer: Self-pay | Admitting: Adult Health

## 2024-05-11 ENCOUNTER — Ambulatory Visit: Payer: MEDICAID | Admitting: Adult Health

## 2024-05-13 ENCOUNTER — Ambulatory Visit
Admission: EM | Admit: 2024-05-13 | Discharge: 2024-05-13 | Disposition: A | Discharge status: Home / Self Care | Payer: MEDICAID | Attending: Family Medicine | Admitting: Family Medicine

## 2024-05-13 DIAGNOSIS — S80812A Abrasion, left lower leg, initial encounter: Secondary | ICD-10-CM

## 2024-05-13 DIAGNOSIS — W19XXXA Unspecified fall, initial encounter: Secondary | ICD-10-CM

## 2024-05-13 MED ORDER — CHLORHEXIDINE GLUCONATE 4 % EX SOLN: Freq: Every day | CUTANEOUS | 0 refills | Status: DC | PRN

## 2024-05-13 MED ORDER — BACITRACIN 500 UNIT/GM EX OINT
1.0000 | TOPICAL_OINTMENT | Freq: Once | CUTANEOUS | Status: AC
  Administered 2024-05-13: 1 via TOPICAL

## 2024-05-13 MED ORDER — MUPIROCIN 2 % EX OINT: 1.0000 | TOPICAL_OINTMENT | Freq: Two times a day (BID) | CUTANEOUS | 0 refills | Status: DC

## 2024-05-13 NOTE — ED Provider Notes (Signed)
 [facility]    CSN: [id number] Arrival date & time: 05/13/24  1201      History   Chief Complaint No chief complaint on file.   HPI Jaime [name] is a 38 y.o. female.   Patient presenting today with pain redness and swelling to the left lower leg after a fall last week where she scraped the area on concrete.  She denies decreased range of motion, numbness, tingling, weakness, bleeding or drainage, fevers.  Cleaning the area with peroxide, otherwise not try anything over-the-counter for symptoms.    Past Medical History:  Diagnosis Date   Acute encephalopathy 08/31/2020   Adjustment disorder with depressed mood 09/07/2020   Anxiety    Asthma    Chronic abdominal pain    Chronic back pain    Chronic prescription opiate use 02/16/2022   Cirrhosis (HCC)    Cirrhosis (HCC)    COPD (chronic obstructive pulmonary disease) (HCC)    Depression    Diabetes mellitus without complication (HCC)    Diabetes mellitus, type II (HCC)    Diverticulosis    DVT (deep venous thrombosis) (HCC) 02/04/2023   taking lovenox  prior to procedure   Fatty liver 03/07/2020   GERD (gastroesophageal reflux disease)    Hepatitis C    Hernia, abdominal    Hyperglycemia due to type 2 diabetes mellitus (HCC) 05/24/2020   Hyperlipidemia    IBS (irritable bowel syndrome) 02/16/2022   Insomnia    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)    Lupus    Migraine headache    Morbid obesity (HCC) 05/24/2020   Neuropathy    Nocturnal seizures (HCC)    Pain of upper abdomen 07/28/2021   Peptic ulcer    Spleen enlarged    Splenomegaly     Patient Active Problem List   Diagnosis Date Noted   Hx of bipolar disorder 04/05/2024   Lumbar pain 12/09/2023   Cellulitis and abscess of buttock 10/22/2023   Type 2 diabetes mellitus with other specified complication (HCC) 10/22/2023   Encounter for IUD insertion 10/06/2023   Hospital discharge follow-up 10/05/2023    Depression, major, single episode, severe (HCC) 10/05/2023   Acute pulmonary embolism (HCC) 09/25/2023   Elevated d-dimer 09/16/2023   Elevated brain natriuretic peptide (BNP) level 09/16/2023   Hypoalbuminemia due to protein-calorie malnutrition (HCC) 09/16/2023   Acute deep vein thrombosis (DVT) of left lower extremity (HCC) 09/15/2023   Left leg cellulitis 09/14/2023   Deep vein thrombophlebitis of left leg (HCC) 08/26/2023   Lumbar compression fracture (HCC) 02/10/2023   Left leg DVT (HCC) 02/05/2023   At risk for arrhythmia 01/26/2023   Chronic back pain 01/01/2023   Insomnia 01/01/2023   Depression, recurrent (HCC) 01/01/2023   Constipation 05/18/2022   IBS (irritable bowel syndrome) 02/16/2022   Chronic prescription opiate use 02/16/2022   Gastroesophageal reflux disease 10/23/2021   Nausea and vomiting 10/23/2021   Diarrhea 10/23/2021   Pain of upper abdomen 07/28/2021   Adjustment disorder with depressed mood 09/07/2020   Dyspnea 07/29/2020   Anasarca 05/24/2020   Hyperglycemia due to type 2 diabetes mellitus (HCC) 05/24/2020   Morbid obesity (HCC) 05/24/2020   Elevated serum immunoglobulin free light chains 03/08/2020   Fatty liver 03/07/2020   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   Splenomegaly 03/07/2020   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   Pressure injury of skin 01/29/2019   Uncontrolled diabetes mellitus 09/19/2018   Hyperglycemia 09/17/2018   Acute respiratory failure with hypoxia (HCC)  09/15/2018   Anxiety 09/15/2018   Chronic abdominal pain 09/15/2018   Diabetes mellitus without complication (HCC) 09/15/2018   Hepatitis C 09/15/2018   Peptic ulcer 09/15/2018   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) 09/15/2018   Cirrhosis (HCC) 09/15/2018   Tobacco use disorder 05/22/2018   CAP (community acquired pneumonia) 07/17/2016   Chronic pain disorder 07/17/2016   Depression 05/24/2013    Past Surgical History:  Procedure  Laterality Date   BIOPSY  11/25/2021   Procedure: BIOPSY;  Surgeon: Eartha Angelia Sieving, MD;  [facility]: [facility];  Service: Gastroenterology;;   CHOLECYSTECTOMY     COLONOSCOPY WITH PROPOFOL  N/A 11/25/2021   Procedure: COLONOSCOPY WITH PROPOFOL ;  Surgeon: Eartha Angelia Sieving, MD;  Location: [facility];  Service: Gastroenterology;  Laterality: N/A;  805   COLONOSCOPY WITH PROPOFOL  N/A 03/31/2022   Procedure: COLONOSCOPY WITH PROPOFOL ;  Surgeon: Eartha Angelia Sieving, MD;  Location: [facility];  Service: Gastroenterology;  Laterality: N/A;  730   ESOPHAGOGASTRODUODENOSCOPY  06/2020   done at [facility], candida in upper esophagus (treated with diflucan ), ulcerative esophagitis at GE junction, gastritis in stomach, single ulcer in duodenal bulb, with duodenal mucosa showing no abnormality. No presence of varices   ESOPHAGOGASTRODUODENOSCOPY (EGD) WITH PROPOFOL  N/A 11/25/2021   Procedure: ESOPHAGOGASTRODUODENOSCOPY (EGD) WITH PROPOFOL ;  Surgeon: Eartha Angelia Sieving, MD;  Location: [facility];  Service: Gastroenterology;  Laterality: N/A;   ESOPHAGOGASTRODUODENOSCOPY (EGD) WITH PROPOFOL  N/A 03/31/2022   Procedure: ESOPHAGOGASTRODUODENOSCOPY (EGD) WITH PROPOFOL ;  Surgeon: Eartha Angelia Sieving, MD;  Location: [facility];  Service: Gastroenterology;  Laterality: N/A;   IVC FILTER REMOVAL N/A 09/16/2023   Procedure: IVC FILTER REMOVAL;  Surgeon: Pearline Norman RAMAN, MD;  Location: [facility] IN[facility]ASIVE CV LAB;  Service: Cardiovascular;  Laterality: N/A;   IVC FILTER REMOVAL N/A 02/14/2024   Procedure: IVC FILTER REMOVAL;  Surgeon: Pearline Norman RAMAN, MD;  Location: [facility] INVASIVE CV LAB;  Service: Cardiovascular;  Laterality: N/A;   PERIPHERAL VASCULAR THROMBECTOMY Left 09/16/2023   Procedure: PERIPHERAL VASCULAR THROMBECTOMY;  Surgeon: Pearline Norman RAMAN, MD;  Location: [facility] INVASIVE CV LAB;  Service: Cardiovascular;  Laterality: Left;    OB History     Gravida  0   Para  0   Term   0   Preterm  0   AB  0   Living  0      SAB  0   IAB  0   Ectopic  0   Multiple  0   Live Births  0            Home Medications    Prior to Admission medications   Medication Sig Start Date End Date Taking? Authorizing Provider  chlorhexidine  (HIBICLENS ) 4 % external liquid Apply topically daily as needed. 05/13/24  Yes Stuart Vernell Norris, PA-C  mupirocin  ointment (BACTROBAN ) 2 % Apply 1 Application topically 2 (two) times daily. 05/13/24  Yes Stuart Vernell Norris, PA-C  acetaminophen  (TYLENOL ) 500 MG tablet Take 1,000 mg by mouth 2 (two) times daily as needed for moderate pain (pain score 4-6) or headache.    [provider]  albuterol  (VENTOLIN  HFA) 108 (90 Base) MCG/ACT inhaler Inhale 2 puffs into the lungs every 6 (six) hours as needed for wheezing or shortness of breath. 07/17/22   Leath-Warren, Etta PARAS, NP  apixaban  (ELIQUIS ) 5 MG TABS tablet Take 2 tablets (10 mg total) by mouth 2 (two) times daily for 6 days, THEN 1 tablet (5 mg total)  2 (two) times daily. Patient taking differently: 1 tablet (5 mg total) 2 (two) times daily. 09/28/23 04/01/24  Ricky Fines, MD  ARIPiprazole  (ABILIFY ) 20 MG tablet Take 1 tablet (20 mg total) by mouth daily. 04/05/24   Del Wilhelmena Lloyd Sola, FNP  clonazePAM  (KLONOPIN ) 0.5 MG tablet Take 0.5 mg by mouth 2 (two) times daily as needed for anxiety. 08/02/23   [provider]  Continuous Glucose Receiver (FREESTYLE LIBRE 3 READER) DEVI Use the Freestyle Libre 3 Reader Device to monitor blood sugars as directed by the provider. 02/29/24   Therisa Benton PARAS, NP  Continuous Glucose Sensor (DEXCOM G7 SENSOR) MISC Inject 1 Application into the skin as directed. Change sensor every 10 days as directed. 11/04/23   Therisa Benton PARAS, NP  Continuous Glucose Sensor (FREESTYLE LIBRE 3 PLUS SENSOR) MISC 2 each by Does not apply route as directed. Change sensor every 15 days. 02/29/24   Therisa Benton PARAS, NP  Continuous Glucose  Transmitter (DEXCOM G6 TRANSMITTER) MISC USE AS DIRECTED. 03/24/23   Therisa Benton PARAS, NP  famotidine  (PEPCID ) 20 MG tablet Take 1 tablet (20 mg total) by mouth at bedtime. 09/28/23   Ricky Fines, MD  ferrous sulfate  325 (65 FE) MG EC tablet Take 1 tablet (325 mg total) by mouth daily with breakfast. 09/13/23   Lamon Herter M, PA-C  glucose blood (ACCU-CHEK GUIDE) test strip Use as instructed to check blood glucose four times daily 12/01/21   Therisa Benton PARAS, NP  hydrOXYzine  (VISTARIL ) 50 MG capsule Take 1 capsule (50 mg total) by mouth 3 (three) times daily as needed. 05/25/23   Del Orbe Polanco, Iliana, FNP  hyoscyamine  (ANASPAZ ) 0.125 MG TBDP disintergrating tablet Place 1 tablet (0.125 mg total) under the tongue every 6 (six) hours as needed. 12/13/23   Eartha Angelia Sieving, MD  Insulin  Pen Needle (PEN NEEDLES) 32G X 6 MM MISC 1 each by Does not apply route 3 (three) times daily. 02/26/21   Therisa Benton PARAS, NP  insulin  regular human CONCENTRATED (HUMULIN  R U-500 KWIKPEN) 500 UNIT/ML KwikPen Inject 80 Units into the skin 3 (three) times daily with meals. 03/06/24   Therisa Benton PARAS, NP  lidocaine  (LIDODERM ) 5 % Place 1 patch onto the skin daily. Remove & Discard patch within 12 hours or as directed by MD. DO not APPLY TO INFLAMED , RED SKIN 04/12/24   Antonetta Rollene BRAVO, MD  lidocaine  (XYLOCAINE ) 5 % ointment Apply 1 Application topically as needed. Patient taking differently: Apply 1 Application topically 2 (two) times daily as needed (pain). 04/27/23   Del Wilhelmena Lloyd Sola, FNP  lisinopril  (ZESTRIL ) 5 MG tablet Take 5 mg by mouth daily.    [provider]  metFORMIN  (GLUCOPHAGE ) 1000 MG tablet Take 1 tablet (1,000 mg total) by mouth 2 (two) times daily with a meal. take 1 tablet by mouth 2 times a day with a meal. 11/04/23   Therisa Benton PARAS, NP  methadone  (DOLOPHINE ) 10 MG/ML solution Take 105 mg by mouth daily.    [provider]  methocarbamol (ROBAXIN)  750 MG tablet Take 750 mg by mouth every 6 (six) hours as needed for muscle spasms.    [provider]  NARCAN  4 MG/0.1ML LIQD nasal spray kit Place 1 spray into the nose once as needed (OD). 04/07/24   [provider]  nystatin  cream (MYCOSTATIN ) Apply 1 Application topically 2 (two) times daily. 10/22/23   Antonetta Rollene BRAVO, MD  Omega-3 Fatty Acids (ENTERIC FISH OIL) 1000  MG CPDR Take 1,000 mg by mouth 3 (three) times daily.    [provider]  omeprazole  (PRILOSEC) 40 MG capsule TAKE 1 CAPSULE BY MOUTH TWICE DAILY. 05/10/23   Carlan, Chelsea L, NP  ondansetron  (ZOFRAN ) 4 MG tablet Take 1 tablet (4 mg total) by mouth every 8 (eight) hours as needed for nausea or vomiting. 03/08/23   Carlan, Chelsea L, NP  Oxycodone  HCl 10 MG TABS Take 1 tablet (10 mg total) by mouth every 6 (six) hours as needed (pain). 09/28/23   Ricky Fines, MD  pantoprazole  (PROTONIX ) 20 MG tablet Take 20 mg by mouth daily. 12/30/23   [provider]  potassium chloride  (KLOR-CON ) 10 MEQ tablet Take 10 mEq by mouth 2 (two) times daily. 08/10/23   [provider]  predniSONE  (DELTASONE ) 20 MG tablet Take 1 tablet (20 mg total) by mouth daily with breakfast. 04/10/24   Stuart Vernell Norris, PA-C  propranolol  (INDERAL ) 10 MG tablet Take 1 tablet (10 mg total) by mouth 2 (two) times daily. 09/28/23   Ricky Fines, MD  QUEtiapine  (SEROQUEL ) 25 MG tablet Take 25 mg by mouth at bedtime. 03/07/24   [provider]  tirzepatide  (MOUNJARO ) 7.5 MG/0.5ML Pen Inject 7.5 mg into the skin once a week. 03/06/24   Therisa Benton PARAS, NP  torsemide  (DEMADEX ) 20 MG tablet Take 2 tablets (40 mg total) by mouth daily. 09/28/23   Ricky Fines, MD  triamcinolone  cream (KENALOG ) 0.1 % Apply 1 Application topically 2 (two) times daily. 04/10/24   Stuart Vernell Norris, PA-C  venlafaxine  XR (EFFEXOR  XR) 150 MG 24 hr capsule Take 1 capsule (150 mg total) by mouth daily with breakfast. 04/05/24   Del Wilhelmena Lloyd Sola, FNP    Family History Family History  Problem Relation Age of Onset   Hyperlipidemia Maternal Grandfather     Social History Social History   Tobacco Use   Smoking status: Every Day    Current packs/day: 0.50    Types: Cigarettes    Passive exposure: Current   Smokeless tobacco: Never  Vaping Use   Vaping status: Every Day   Substances: Nicotine , Flavoring  Substance Use Topics   Alcohol use: No   Drug use: No     Allergies   Firvanq  [vancomycin ], Iodine -131, Ivp dye [iodinated contrast media], Toradol [ketorolac tromethamine], Tylenol  [acetaminophen ], Nsaids, Suboxone  [buprenorphine hcl-naloxone  hcl], and Neurontin  [gabapentin ]   Review of Systems Review of Systems PER HPI  Physical Exam Triage Vital Signs ED Triage Vitals  Encounter Vitals Group     BP 05/13/24 1211 100/66     Girls Systolic BP Percentile --      Girls Diastolic BP Percentile --      Boys Systolic BP Percentile --      Boys Diastolic BP Percentile --      Pulse Rate 05/13/24 1211 79     Resp 05/13/24 1211 18     Temp 05/13/24 1211 98.3 F (36.8 C)     Temp Source 05/13/24 1211 Oral     SpO2 05/13/24 1211 95 %     Weight --      Height --      Head Circumference --      Peak Flow --      Pain Score 05/13/24 1214 8     Pain Loc --      Pain Education --      Exclude from Growth Chart --    No data found.  Updated Vital  Signs BP 100/66 (BP Location: Right Arm)   Pulse 79   Temp 98.3 F (36.8 C) (Oral)   Resp 18   SpO2 95%   Visual Acuity Right Eye Distance:   Left Eye Distance:   Bilateral Distance:    Right Eye Near:   Left Eye Near:    Bilateral Near:     Physical Exam Vitals and nursing note reviewed.  Constitutional:      Appearance: Normal appearance. She is not ill-appearing.  HENT:     Head: Atraumatic.  Eyes:     Extraocular Movements: Extraocular movements intact.     Conjunctiva/sclera: Conjunctivae normal.  Cardiovascular:     Rate and  Rhythm: Normal rate.  Pulmonary:     Effort: Pulmonary effort is normal.  Musculoskeletal:        General: Normal range of motion.     Cervical back: Normal range of motion and neck supple.  Skin:    General: Skin is warm and dry.     Comments: Scabbed slightly ulcerated abrasions to the left knee and left lower leg.  Area of maculopapular hyperpigmentation surrounding the abrasion to the left lower leg but no fluctuance, induration, significant warmth, bleeding or drainage to the area  Neurological:     Mental Status: She is alert and oriented to person, place, and time.  Psychiatric:        Mood and Affect: Mood normal.        Thought Content: Thought content normal.        Judgment: Judgment normal.      UC Treatments / Results  Labs (all labs ordered are listed, but only abnormal results are displayed) Labs Reviewed - No data to display  EKG   Radiology No results found.  Procedures Procedures (including critical care time)  Medications Ordered in UC Medications  bacitracin  ointment 1 Application (has no administration in time range)    Initial Impression / Assessment and Plan / UC Course  I have reviewed the triage vital signs and the nursing notes.  Pertinent labs & imaging results that were available during my care of the patient were reviewed by me and considered in my medical decision making (see chart for details).     Area cleaned with Hibiclens  and bacitracin  and a nonstick dressing applied.  Does not appear to be infected, discoloration and leg more consistent with an irritant reaction than cellulitis.  Will focus on consistent topical wound care with Hibiclens  and mupirocin , nonstick dressings, elevate leg at rest, compression stockings to help with any swelling in the leg.  Return precautions reviewed.  Final Clinical Impressions(s) / UC Diagnoses   Final diagnoses:  Abrasion of left lower extremity, initial encounter  Fall, initial encounter      Discharge Instructions      Clean the area twice daily with hibiclens  and apply the ointment and nonstick dressings until fully healed     ED Prescriptions     Medication Sig Dispense Auth. Provider   chlorhexidine  (HIBICLENS ) 4 % external liquid Apply topically daily as needed. 236 mL Stuart Vernell Norris, PA-C   mupirocin  ointment (BACTROBAN ) 2 % Apply 1 Application topically 2 (two) times daily. 60 g Stuart Vernell Norris, [place]      PDMP not reviewed this encounter.   Stuart Vernell Norris, [place] 05/13/24 1311

## 2024-05-13 NOTE — ED Triage Notes (Signed)
 Pt report redness, pain  and swelling in the LLE after a fall last week.

## 2024-05-13 NOTE — Discharge Instructions (Addendum)
 Clean the area twice daily with hibiclens  and apply the ointment and nonstick dressings until fully healed

## 2024-05-15 ENCOUNTER — Ambulatory Visit: Payer: Self-pay

## 2024-05-15 ENCOUNTER — Encounter: Payer: Self-pay | Admitting: Family Medicine

## 2024-05-15 DIAGNOSIS — Z09 Encounter for follow-up examination after completed treatment for conditions other than malignant neoplasm: Secondary | ICD-10-CM | POA: Insufficient documentation

## 2024-05-15 DIAGNOSIS — M79605 Pain in left leg: Secondary | ICD-10-CM | POA: Insufficient documentation

## 2024-05-15 NOTE — Progress Notes (Signed)
   Jaime [name]     MRN: [id number]      DOB: Mar 08, 1986  Chief Complaint  Patient presents with   Er follow up    Was advised to go to the ER after she started swelling in the left lower leg yesterday and was diagnosed with cellulitis and stable clot. Was told to follow up with her pcp office. States her left leg is really hurting her today    HPI Jaime [name] is here with above concern , she was evaluated in ED 1 day ago on 6/10 wit a dx of LLE cellulitis , and DVT ruled out  at the ED visit. She has been prescribed Septra  .  In office with acute visit the day following Ed eval, c/o uncontrolled leg pain Hospital c records reviewed at office visit. No h/o trauma ROS Denies recent fever or chills. Denies sinus pressure, nasal congestion, ear pain or sore throat. Denies chest congestion, productive cough or wheezing. Denies chest pains, palpitations. Denies depression, anxiety or insomnia.  PE  BP 128/80   Pulse 91   Resp 16   Ht 5' 10 (1.778 m)   Wt 214 lb (97.1 kg)   SpO2 93%   BMI 30.71 kg/m   Patient alert and oriented and in no cardiopulmonary distress.  HEENT: No facial asymmetry, EOMI,     Neck supple .  Chest: Clear to auscultation bilaterally.  CVS: S1, S2 no murmurs, no S3.Regular rate.  ABD: Soft non tender.   Ext: erythema and mild swelling of LLE , no warmth , mildly tender  MS: Adequate ROM spine, shoulders, hips and knees.  Skin: Intact, no ulcerations or rash noted.  Psych: Good eye contact, anxious oand mildlydepressed appearing.  CNS: CN 2-12 intact, power,  normal throughout.no focal deficits noted.   Assessment & Plan  Acute deep vein thrombosis (DVT) of left lower extremity (HCC) Pt aware of report and is again reassured that there is no new occlusive LE clot contributing to leg pain  Left leg pain Lidoderm  patches prescribed  Cellulitis of left leg Pt advised to take antinbiotic septra  as prescribed 1 day ago in the ED , seek care if  condition worsens, as inm increased pain, fever, chills, spreading/ worsening of redness  Depression, major, single episode, severe (HCC) Uncontrolled, not suicidal or homicidal, needs psych management , will reach out to PCP, pt receiving klonopin  regularly from [name]  Anxiety Uncontroled, receiving klonopin  rx from pCP in [name]  Encounter for examination following treatment at hospital Patient in for follow up of recent ED visit.one day ago on 6/100/2025 Discharge summary,  and radiology data are reviewed, and any questions or concerns  are discussed. Specific issues requiring follow up are specifically addressed.

## 2024-05-15 NOTE — Assessment & Plan Note (Signed)
 Uncontrolled, not suicidal or homicidal, needs psych management , will reach out to PCP, pt receiving klonopin  regularly from [facility] [place]

## 2024-05-15 NOTE — Assessment & Plan Note (Signed)
 Uncontroled, receiving klonopin  rx from pCP in [place]

## 2024-05-15 NOTE — Telephone Encounter (Signed)
 Copied from CRM [id number]. Topic: Clinical - Red Word Triage >> May 15, 2024 10:09 AM Jaime [name] wrote: [facility] that prompted transfer to Nurse Triage: Rash on left leg - swollen and it's really red.. Saturday went to Urgent care gave her cream and wash.. it's worsening... Reason for Disposition . SEVERE itching (i.e., interferes with sleep, normal activities or school)  Answer Assessment - Initial Assessment Questions 1. APPEARANCE of RASH: What does the rash look like? (e.[name]., blisters, dry flaky skin, red spots, redness, sores)     red 2. SIZE: How big are the spots? (e.[name]., tip of pen, eraser, coin; inches, centimeters)     N/a 3. LOCATION: Where is the rash located?     Left lower leg above ankle 4. COLOR: What color is the rash? (Note: It is difficult to assess rash color in people with darker-colored skin. When this situation occurs, simply ask the caller to describe what they see.)     red 5. ONSET: When did the rash begin?     3 weeks 6. FEVER: Do you have a fever? If Yes, ask: What is your temperature, how was it measured, and when did it start?     no 7. ITCHING: Does the rash itch? If Yes, ask: How bad is the itch? (Scale 1-10; or mild, moderate, severe)     yes 8. CAUSE: What do you think is causing the rash?     unsure 9. MEDICINE FACTORS: Have you started any new medicines within the last 2 weeks? (e.[name]., antibiotics)      no 10. OTHER SYMPTOMS: Do you have any other symptoms? (e.[name]., dizziness, headache, sore throat, joint pain)       pain 11. PREGNANCY: Is there any chance you are pregnant? When was your last menstrual period?       no  Protocols used: Rash or Redness - [facility]

## 2024-05-15 NOTE — Assessment & Plan Note (Addendum)
 Pt aware of report and is again reassured that there is no new occlusive LE clot contributing to leg pain

## 2024-05-15 NOTE — Assessment & Plan Note (Signed)
 Patient in for follow up of recent ED visit.one day ago on 6/100/2025 Discharge summary,  and radiology data are reviewed, and any questions or concerns  are discussed. Specific issues requiring follow up are specifically addressed.

## 2024-05-15 NOTE — Assessment & Plan Note (Signed)
 Pt advised to take antinbiotic septra  as prescribed 1 day ago in the ED , seek care if condition worsens, as inm increased pain, fever, chills, spreading/ worsening of redness

## 2024-05-15 NOTE — Assessment & Plan Note (Signed)
Lidoderm patches prescribed

## 2024-05-17 ENCOUNTER — Ambulatory Visit (INDEPENDENT_AMBULATORY_CARE_PROVIDER_SITE_OTHER): Payer: MEDICAID | Admitting: Internal Medicine

## 2024-05-17 ENCOUNTER — Encounter: Payer: Self-pay | Admitting: Internal Medicine

## 2024-05-17 VITALS — BP 99/62 | HR 106 | Ht 70.0 in | Wt 226.0 lb

## 2024-05-17 DIAGNOSIS — Z794 Long term (current) use of insulin: Secondary | ICD-10-CM

## 2024-05-17 DIAGNOSIS — Z9189 Other specified personal risk factors, not elsewhere classified: Secondary | ICD-10-CM | POA: Diagnosis not present

## 2024-05-17 DIAGNOSIS — E1169 Type 2 diabetes mellitus with other specified complication: Secondary | ICD-10-CM | POA: Diagnosis not present

## 2024-05-17 DIAGNOSIS — L299 Pruritus, unspecified: Secondary | ICD-10-CM | POA: Diagnosis not present

## 2024-05-17 DIAGNOSIS — L03116 Cellulitis of left lower limb: Secondary | ICD-10-CM

## 2024-05-17 MED ORDER — HYDROXYZINE HCL 10 MG PO TABS: 10.0000 mg | ORAL_TABLET | Freq: Three times a day (TID) | ORAL | 1 refills | Status: DC | PRN

## 2024-05-17 MED ORDER — DOXYCYCLINE HYCLATE 100 MG PO TABS: 100.0000 mg | ORAL_TABLET | Freq: Two times a day (BID) | ORAL | 0 refills | Status: DC

## 2024-05-17 NOTE — Patient Instructions (Addendum)
 Please start taking Doxycycline  as prescribed for leg cellulitis.  Please apply Mupirocin  ointment for local bacterial infection prophylaxis.  Please take Hydroxyzine  as needed for itching. Please apply lotion for local itching.

## 2024-05-17 NOTE — Assessment & Plan Note (Signed)
 Started empiric doxycycline  Mupirocin  ointment for local care Hydroxyzine  as needed for itching Advised to apply lotion or moisturizer for dry skin

## 2024-05-17 NOTE — Assessment & Plan Note (Signed)
 Lab Results  Component Value Date   HGBA1C 6.5 (A) 03/06/2024   Well-controlled now On Humulin  80 U TID, followed by endocrinology Continue to follow diabetic diet Avoid oral steroid for itching due to DM

## 2024-05-17 NOTE — Assessment & Plan Note (Signed)
 Has snoring, daytime fatigue and morbid obesity STOP-BANG: 5 At  high risk for sleep apnea Referred to pulmonology for sleep apnea evaluation Needs to focus on low-carb diet in an effort to lose weight

## 2024-05-17 NOTE — Progress Notes (Signed)
 Acute Office Visit  Subjective:    Patient ID: Jaime [name], female    DOB: 01-18-1986, 38 y.o.   MRN: [id number]  Chief Complaint  Patient presents with   Rash    Pt reports sx of painful rash on her lower left leg, ongoing for 2 weeks.     HPI Patient is in today for complaint of left leg itching and rash for the last 2 weeks.  She had a fall about 2 weeks ago, where she scraped the area on concrete.  She went to urgent care in the last week, was given mupirocin  ointment and Hibiclens  liquid for local care.  She denies any local bleeding or discharge.  Denies any fever or chills.  Her abrasion appears to be improving compared to urgent care visit, but has localized erythema and warmth.  Past Medical History:  Diagnosis Date   Acute encephalopathy 08/31/2020   Adjustment disorder with depressed mood 09/07/2020   Anxiety    Asthma    Chronic abdominal pain    Chronic back pain    Chronic prescription opiate use 02/16/2022   Cirrhosis (HCC)    Cirrhosis (HCC)    COPD (chronic obstructive pulmonary disease) (HCC)    Depression    Diabetes mellitus without complication (HCC)    Diabetes mellitus, type II (HCC)    Diverticulosis    DVT (deep venous thrombosis) (HCC) 02/04/2023   taking lovenox  prior to procedure   Fatty liver 03/07/2020   GERD (gastroesophageal reflux disease)    Hepatitis C    Hernia, abdominal    Hyperglycemia due to type 2 diabetes mellitus (HCC) 05/24/2020   Hyperlipidemia    IBS (irritable bowel syndrome) 02/16/2022   Insomnia    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)    Lupus    Migraine headache    Morbid obesity (HCC) 05/24/2020   Neuropathy    Nocturnal seizures (HCC)    Pain of upper abdomen 07/28/2021   Peptic ulcer    Spleen enlarged    Splenomegaly     Past Surgical History:  Procedure Laterality Date   BIOPSY  11/25/2021   Procedure: BIOPSY;  Surgeon: Eartha Angelia Sieving, MD;  [facility]: [facility];   Service: Gastroenterology;;   CHOLECYSTECTOMY     COLONOSCOPY WITH PROPOFOL  N/A 11/25/2021   Procedure: COLONOSCOPY WITH PROPOFOL ;  Surgeon: Eartha Angelia Sieving, MD;  Location: [facility];  Service: Gastroenterology;  Laterality: N/A;  805   COLONOSCOPY WITH PROPOFOL  N/A 03/31/2022   Procedure: COLONOSCOPY WITH PROPOFOL ;  Surgeon: Eartha Angelia Sieving, MD;  Location: [facility];  Service: Gastroenterology;  Laterality: N/A;  730   ESOPHAGOGASTRODUODENOSCOPY  06/2020   done at [facility], candida in upper esophagus (treated with diflucan ), ulcerative esophagitis at GE junction, gastritis in stomach, single ulcer in duodenal bulb, with duodenal mucosa showing no abnormality. No presence of varices   ESOPHAGOGASTRODUODENOSCOPY (EGD) WITH PROPOFOL  N/A 11/25/2021   Procedure: ESOPHAGOGASTRODUODENOSCOPY (EGD) WITH PROPOFOL ;  Surgeon: Eartha Angelia Sieving, MD;  Location: [facility];  Service: Gastroenterology;  Laterality: N/A;   ESOPHAGOGASTRODUODENOSCOPY (EGD) WITH PROPOFOL  N/A 03/31/2022   Procedure: ESOPHAGOGASTRODUODENOSCOPY (EGD) WITH PROPOFOL ;  Surgeon: Eartha Angelia Sieving, MD;  Location: [facility];  Service: Gastroenterology;  Laterality: N/A;   IVC FILTER REMOVAL N/A 09/16/2023   Procedure: IVC FILTER REMOVAL;  Surgeon: Pearline Norman RAMAN, MD;  Location: [facility] INVASIVE CV LAB;  Service: Cardiovascular;  Laterality: N/A;   IVC FILTER REMOVAL N/A 02/14/2024  Procedure: IVC FILTER REMOVAL;  Surgeon: Pearline Norman RAMAN, MD;  Location: [facility] INVASIVE CV LAB;  Service: Cardiovascular;  Laterality: N/A;   PERIPHERAL VASCULAR THROMBECTOMY Left 09/16/2023   Procedure: PERIPHERAL VASCULAR THROMBECTOMY;  Surgeon: Pearline Norman RAMAN, MD;  Location: [facility] INVASIVE CV LAB;  Service: Cardiovascular;  Laterality: Left;    Family History  Problem Relation Age of Onset   Hyperlipidemia Maternal Grandfather     Social History   Socioeconomic History   Marital status: Married    Spouse name:  Not on file   Number of children: Not on file   Years of education: Not on file   Highest education level: Some college, no degree  Occupational History   Not on file  Tobacco Use   Smoking status: Every Day    Current packs/day: 0.50    Types: Cigarettes    Passive exposure: Current   Smokeless tobacco: Never  Vaping Use   Vaping status: Every Day   Substances: Nicotine , Flavoring  Substance and Sexual Activity   Alcohol use: No   Drug use: No   Sexual activity: Yes    Birth control/protection: Implant  Other Topics Concern   Not on file  Social History Narrative   Not on file   Social Drivers of Health   Financial Resource Strain: High Risk (12/09/2023)   Overall Financial Resource Strain (CARDIA)    Difficulty of Paying Living Expenses: Very hard  Food Insecurity: Food Insecurity Present (12/09/2023)   Hunger Vital Sign    Worried About Running Out of Food in the Last Year: Often true    Ran Out of Food in the Last Year: Often true  Transportation Needs: No Transportation Needs (12/09/2023)   PRAPARE - Administrator, Civil Service (Medical): No    Lack of Transportation (Non-Medical): No  Physical Activity: Insufficiently Active (12/09/2023)   Exercise Vital Sign    Days of Exercise per Week: 1 day    Minutes of Exercise per Session: 10 min  Stress: Stress Concern Present (05/09/2020)   [employer] of Occupational Health - Occupational Stress Questionnaire    Feeling of Stress : Very much  Social Connections: Moderately Integrated (12/09/2023)   Social Connection and Isolation Panel    Frequency of Communication with Friends and Family: More than three times a week    Frequency of Social Gatherings with Friends and Family: More than three times a week    Attends Religious Services: 1 to 4 times per year    Active Member of [employer] or Organizations: Yes    Attends Banker Meetings: 1 to 4 times per year    Marital Status: Separated  Intimate  Partner Violence: Not At Risk (09/25/2023)   Humiliation, Afraid, Rape, and Kick questionnaire    Fear of Current or Ex-Partner: No    Emotionally Abused: No    Physically Abused: No    Sexually Abused: No    Outpatient Medications Prior to Visit  Medication Sig Dispense Refill   acetaminophen  (TYLENOL ) 500 MG tablet Take 1,000 mg by mouth 2 (two) times daily as needed for moderate pain (pain score 4-6) or headache.     albuterol  (VENTOLIN  HFA) 108 (90 Base) MCG/ACT inhaler Inhale 2 puffs into the lungs every 6 (six) hours as needed for wheezing or shortness of breath. 8 g 0   apixaban  (ELIQUIS ) 5 MG TABS tablet Take 2 tablets (10 mg total) by mouth 2 (two) times daily for 6 days, THEN 1  tablet (5 mg total) 2 (two) times daily. (Patient taking differently: 1 tablet (5 mg total) 2 (two) times daily.) 60 tablet 4   ARIPiprazole  (ABILIFY ) 20 MG tablet Take 1 tablet (20 mg total) by mouth daily. 30 tablet 3   chlorhexidine  (HIBICLENS ) 4 % external liquid Apply topically daily as needed. 236 mL 0   clonazePAM  (KLONOPIN ) 0.5 MG tablet Take 0.5 mg by mouth 2 (two) times daily as needed for anxiety.     Continuous Glucose Receiver (FREESTYLE LIBRE 3 READER) DEVI Use the Freestyle Libre 3 Reader Device to monitor blood sugars as directed by the provider. 1 each 0   Continuous Glucose Sensor (DEXCOM G7 SENSOR) MISC Inject 1 Application into the skin as directed. Change sensor every 10 days as directed. 9 each 3   Continuous Glucose Sensor (FREESTYLE LIBRE 3 PLUS SENSOR) MISC 2 each by Does not apply route as directed. Change sensor every 15 days. 6 each 0   Continuous Glucose Transmitter (DEXCOM G6 TRANSMITTER) MISC USE AS DIRECTED. 1 each 1   famotidine  (PEPCID ) 20 MG tablet Take 1 tablet (20 mg total) by mouth at bedtime.     ferrous sulfate  325 (65 FE) MG EC tablet Take 1 tablet (325 mg total) by mouth daily with breakfast. 90 tablet 3   glucose blood (ACCU-CHEK GUIDE) test strip Use as instructed to  check blood glucose four times daily 150 each 5   hydrOXYzine  (VISTARIL ) 50 MG capsule Take 1 capsule (50 mg total) by mouth 3 (three) times daily as needed. 30 capsule 2   hyoscyamine  (ANASPAZ ) 0.125 MG TBDP disintergrating tablet Place 1 tablet (0.125 mg total) under the tongue every 6 (six) hours as needed. 180 tablet 1   Insulin  Pen Needle (PEN NEEDLES) 32G X 6 MM MISC 1 each by Does not apply route 3 (three) times daily. 100 each 3   insulin  regular human CONCENTRATED (HUMULIN  R U-500 KWIKPEN) 500 UNIT/ML KwikPen Inject 80 Units into the skin 3 (three) times daily with meals. 45 mL 3   lidocaine  (LIDODERM ) 5 % Place 1 patch onto the skin daily. Remove & Discard patch within 12 hours or as directed by MD. DO not APPLY TO INFLAMED , RED SKIN 30 patch 0   lidocaine  (XYLOCAINE ) 5 % ointment Apply 1 Application topically as needed. (Patient taking differently: Apply 1 Application topically 2 (two) times daily as needed (pain).) 35.44 g 5   lisinopril  (ZESTRIL ) 5 MG tablet Take 5 mg by mouth daily.     metFORMIN  (GLUCOPHAGE ) 1000 MG tablet Take 1 tablet (1,000 mg total) by mouth 2 (two) times daily with a meal. take 1 tablet by mouth 2 times a day with a meal. 180 tablet 3   methadone  (DOLOPHINE ) 10 MG/ML solution Take 105 mg by mouth daily.     methocarbamol (ROBAXIN) 750 MG tablet Take 750 mg by mouth every 6 (six) hours as needed for muscle spasms.     mupirocin  ointment (BACTROBAN ) 2 % Apply 1 Application topically 2 (two) times daily. 60 g 0   NARCAN  4 MG/0.1ML LIQD nasal spray kit Place 1 spray into the nose once as needed (OD).     nystatin  cream (MYCOSTATIN ) Apply 1 Application topically 2 (two) times daily. 30 g 0   Omega-3 Fatty Acids (ENTERIC FISH OIL) 1000 MG CPDR Take 1,000 mg by mouth 3 (three) times daily.     omeprazole  (PRILOSEC) 40 MG capsule TAKE 1 CAPSULE BY MOUTH TWICE DAILY. 120 capsule 5  ondansetron  (ZOFRAN ) 4 MG tablet Take 1 tablet (4 mg total) by mouth every 8 (eight) hours  as needed for nausea or vomiting. 20 tablet 1   Oxycodone  HCl 10 MG TABS Take 1 tablet (10 mg total) by mouth every 6 (six) hours as needed (pain). 30 tablet 0   pantoprazole  (PROTONIX ) 20 MG tablet Take 20 mg by mouth daily.     potassium chloride  (KLOR-CON ) 10 MEQ tablet Take 10 mEq by mouth 2 (two) times daily.     propranolol  (INDERAL ) 10 MG tablet Take 1 tablet (10 mg total) by mouth 2 (two) times daily.     QUEtiapine  (SEROQUEL ) 25 MG tablet Take 25 mg by mouth at bedtime.     tirzepatide  (MOUNJARO ) 7.5 MG/0.5ML Pen Inject 7.5 mg into the skin once a week. 6 mL 1   torsemide  (DEMADEX ) 20 MG tablet Take 2 tablets (40 mg total) by mouth daily.     triamcinolone  cream (KENALOG ) 0.1 % Apply 1 Application topically 2 (two) times daily. 60 g 0   venlafaxine  XR (EFFEXOR  XR) 150 MG 24 hr capsule Take 1 capsule (150 mg total) by mouth daily with breakfast. 30 capsule 3   predniSONE  (DELTASONE ) 20 MG tablet Take 1 tablet (20 mg total) by mouth daily with breakfast. 5 tablet 0   Facility-Administered Medications Prior to Visit  Medication Dose Route Frequency Provider Last Rate Last Admin   etonogestrel  (NEXPLANON ) implant 68 mg  68 mg Subdermal Once Ervin, Michael L, MD       methylPREDNISolone  acetate (DEPO-MEDROL ) injection 40 mg  40 mg Intramuscular Once         Allergies  Allergen Reactions   Firvanq  [Vancomycin ] Swelling    Facial swelling   Iodine -131 Anaphylaxis, Shortness Of Breath and Swelling   Ivp Dye [Iodinated Contrast Media] Anaphylaxis    Skin redness Difficulty walking   Toradol [Ketorolac Tromethamine] Shortness Of Breath    Flares ulcers   Tylenol  [Acetaminophen ] Other (See Comments)    Hx of cirrhosis    Nsaids Other (See Comments)    Flares ulcers   Suboxone  [Buprenorphine Hcl-Naloxone  Hcl] Other (See Comments)    Withdrawal symptoms    Neurontin  [Gabapentin ] Itching    Review of Systems  Constitutional:  Positive for fatigue. Negative for chills and fever.   HENT:  Negative for congestion and sore throat.   Eyes:  Negative for pain and discharge.  Respiratory:  Negative for cough and shortness of breath.   Cardiovascular:  Negative for chest pain and palpitations.  Gastrointestinal:  Negative for diarrhea, nausea and vomiting.  Endocrine: Negative for polydipsia and polyuria.  Genitourinary:  Negative for dysuria and hematuria.  Musculoskeletal:  Negative for neck pain and neck stiffness.  Skin:  Positive for rash.  Neurological:  Negative for dizziness and weakness.  Psychiatric/Behavioral:  Positive for decreased concentration and sleep disturbance. Negative for agitation and behavioral problems. The patient is nervous/anxious.        Objective:    Physical Exam Vitals reviewed.  Constitutional:      General: She is not in acute distress.    Appearance: She is obese. She is not diaphoretic.  HENT:     Head: Normocephalic and atraumatic.     Nose: Nose normal.     Mouth/Throat:     Mouth: Mucous membranes are moist.  Eyes:     General: No scleral icterus.    Extraocular Movements: Extraocular movements intact.  Cardiovascular:     Rate and Rhythm:  Normal rate and regular rhythm.     Heart sounds: Normal heart sounds. No murmur heard. Pulmonary:     Breath sounds: Normal breath sounds. No wheezing or rales.  Musculoskeletal:     Cervical back: Neck supple. No tenderness.  Skin:    General: Skin is warm.     Comments: Erythema and warmth over left leg, mid shin area Abrasion over left knee area, improving  Neurological:     General: No focal deficit present.     Mental Status: She is alert and oriented to person, place, and time.  Psychiatric:        Mood and Affect: Mood normal.        Behavior: Behavior normal.     BP 99/62   Pulse (!) 106   Ht 5' 10 (1.778 m)   Wt 226 lb (102.5 kg)   SpO2 98%   BMI 32.43 kg/m  Wt Readings from Last 3 Encounters:  05/17/24 226 lb (102.5 kg)  04/12/24 214 lb (97.1 kg)   04/11/24 221 lb (100.2 kg)        Assessment & Plan:   Problem List Items Addressed This Visit       Endocrine   Type 2 diabetes mellitus with other specified complication (HCC)   Lab Results  Component Value Date   HGBA1C 6.5 (A) 03/06/2024   Well-controlled now On Humulin  80 U TID, followed by endocrinology Continue to follow diabetic diet Avoid oral steroid for itching due to DM        Other   Left leg cellulitis - Primary   Started empiric doxycycline  Mupirocin  ointment for local care Hydroxyzine  as needed for itching Advised to apply lotion or moisturizer for dry skin      Relevant Medications   doxycycline  (VIBRA -TABS) 100 MG tablet   At risk for sleep apnea   Has snoring, daytime fatigue and morbid obesity STOP-BANG: 5 At  high risk for sleep apnea Referred to pulmonology for sleep apnea evaluation Needs to focus on low-carb diet in an effort to lose weight       Relevant Orders   Ambulatory referral to Pulmonology   Other Visit Diagnoses       Itching       Relevant Medications   hydrOXYzine  (ATARAX ) 10 MG tablet        Meds ordered this encounter  Medications   doxycycline  (VIBRA -TABS) 100 MG tablet    Sig: Take 1 tablet (100 mg total) by mouth 2 (two) times daily.    Dispense:  14 tablet    Refill:  0   hydrOXYzine  (ATARAX ) 10 MG tablet    Sig: Take 1 tablet (10 mg total) by mouth 3 (three) times daily as needed for itching.    Dispense:  30 tablet    Refill:  1     Lilou Kneip MARLA Blanch, MD

## 2024-05-23 ENCOUNTER — Telehealth: Payer: Self-pay | Admitting: Family Medicine

## 2024-05-23 NOTE — Telephone Encounter (Signed)
 Copied from CRM [id number]. Topic: Referral - Request for Referral >> May 23, 2024  9:45 AM Avram MATSU wrote: Did the patient discuss referral with their provider in the last year? Yes (If No - schedule appointment) (If Yes - send message)  Appointment offered? Yes  Type of order/referral and detailed reason for visit: PT would like referral to [facility] for depression  Preference of office, provider, location: [facility]  If referral order, have you been seen by this specialty before? No (If Yes, this issue or another issue? When? Where?  Can we respond through MyChart? Yes

## 2024-05-28 ENCOUNTER — Other Ambulatory Visit: Payer: Self-pay

## 2024-05-28 ENCOUNTER — Emergency Department (HOSPITAL_COMMUNITY)
Admission: EM | Admit: 2024-05-28 | Discharge: 2024-05-28 | Disposition: A | Discharge status: Home / Self Care | Payer: MEDICAID | Attending: Emergency Medicine | Admitting: Emergency Medicine

## 2024-05-28 ENCOUNTER — Emergency Department (HOSPITAL_COMMUNITY): Payer: MEDICAID

## 2024-05-28 ENCOUNTER — Encounter (HOSPITAL_COMMUNITY): Payer: Self-pay | Admitting: Emergency Medicine

## 2024-05-28 DIAGNOSIS — G43909 Migraine, unspecified, not intractable, without status migrainosus: Secondary | ICD-10-CM | POA: Insufficient documentation

## 2024-05-28 DIAGNOSIS — Z7984 Long term (current) use of oral hypoglycemic drugs: Secondary | ICD-10-CM | POA: Insufficient documentation

## 2024-05-28 DIAGNOSIS — Z794 Long term (current) use of insulin: Secondary | ICD-10-CM | POA: Insufficient documentation

## 2024-05-28 DIAGNOSIS — J449 Chronic obstructive pulmonary disease, unspecified: Secondary | ICD-10-CM | POA: Diagnosis not present

## 2024-05-28 DIAGNOSIS — Z7901 Long term (current) use of anticoagulants: Secondary | ICD-10-CM | POA: Diagnosis not present

## 2024-05-28 DIAGNOSIS — E119 Type 2 diabetes mellitus without complications: Secondary | ICD-10-CM | POA: Diagnosis not present

## 2024-05-28 DIAGNOSIS — J45909 Unspecified asthma, uncomplicated: Secondary | ICD-10-CM | POA: Diagnosis not present

## 2024-05-28 DIAGNOSIS — R112 Nausea with vomiting, unspecified: Secondary | ICD-10-CM | POA: Diagnosis present

## 2024-05-28 LAB — BASIC METABOLIC PANEL WITH GFR
Anion gap: 7 (ref 5–15)
BUN: 8 mg/dL (ref 6–20)
CO2: 26 mmol/L (ref 22–32)
Calcium: 8.6 mg/dL — ABNORMAL LOW (ref 8.9–10.3)
Chloride: 106 mmol/L (ref 98–111)
Creatinine, Ser: 0.65 mg/dL (ref 0.44–1.00)
GFR, Estimated: >60 mL/min (ref >60)
Glucose, Bld: 89 mg/dL (ref 70–99)
Potassium: 3.7 mmol/L (ref 3.5–5.1)
Sodium: 139 mmol/L (ref 135–145)

## 2024-05-28 LAB — CBC WITH DIFFERENTIAL/PLATELET
Abs Immature Granulocytes: 0.01 K/uL (ref 0.00–0.07)
Basophils Absolute: 0 K/uL (ref 0.0–0.1)
Basophils Relative: 1 %
Eosinophils Absolute: 0.6 K/uL — ABNORMAL HIGH (ref 0.0–0.5)
Eosinophils Relative: 12 %
HCT: 37.5 % (ref 36.0–46.0)
Hemoglobin: 12.6 g/dL (ref 12.0–15.0)
Immature Granulocytes: 0 %
Lymphocytes Relative: 18 %
Lymphs Abs: 0.9 K/uL (ref 0.7–4.0)
MCH: 30.2 pg (ref 26.0–34.0)
MCHC: 33.6 g/dL (ref 30.0–36.0)
MCV: 89.9 fL (ref 80.0–100.0)
Monocytes Absolute: 0.5 K/uL (ref 0.1–1.0)
Monocytes Relative: 9 %
Neutro Abs: 3.2 K/uL (ref 1.7–7.7)
Neutrophils Relative %: 60 %
Platelets: 97 K/uL — ABNORMAL LOW (ref 150–400)
RBC: 4.17 MIL/uL (ref 3.87–5.11)
RDW: 13.2 % (ref 11.5–15.5)
WBC: 5.2 K/uL (ref 4.0–10.5)
nRBC: 0 % (ref 0.0–0.2)

## 2024-05-28 MED ORDER — ONDANSETRON HCL 4 MG/2ML IJ SOLN
4.0000 mg | Freq: Once | INTRAMUSCULAR | Status: AC
  Administered 2024-05-28: 4 mg via INTRAVENOUS
  Filled 2024-05-28: qty 2

## 2024-05-28 MED ORDER — SUMATRIPTAN SUCCINATE 6 MG/0.5ML ~~LOC~~ SOLN
6.0000 mg | Freq: Once | SUBCUTANEOUS | Status: AC
Start: 2024-05-28 — End: 2024-05-28
  Administered 2024-05-28: 6 mg via SUBCUTANEOUS
  Filled 2024-05-28: qty 0.5

## 2024-05-28 MED ORDER — PROCHLORPERAZINE EDISYLATE 10 MG/2ML IJ SOLN
10.0000 mg | Freq: Once | INTRAMUSCULAR | Status: AC
  Administered 2024-05-28: 10 mg via INTRAVENOUS
  Filled 2024-05-28: qty 2

## 2024-05-28 MED ORDER — MAGNESIUM SULFATE 2 GM/50ML IV SOLN
2.0000 g | Freq: Once | INTRAVENOUS | Status: AC
  Administered 2024-05-28: 2 g via INTRAVENOUS
  Filled 2024-05-28: qty 50

## 2024-05-28 MED ORDER — DIPHENHYDRAMINE HCL 12.5 MG/5ML PO ELIX
12.5000 mg | ORAL_SOLUTION | Freq: Once | ORAL | Status: AC
  Administered 2024-05-28: 12.5 mg via ORAL
  Filled 2024-05-28: qty 5

## 2024-05-28 NOTE — ED Provider Notes (Signed)
 [facility] AT [facility] Provider Note   CSN: [id number] Arrival date & time: 05/28/24  9156     Patient presents with: Migraine   Jaime [name] is a 38 y.o. female.   38 year old female with past medical history significant for migraines presents today for concern of migraine headache.  She states she has not had a migraine headache in years but this feels typical.  With associated nausea, vomiting.  Headache woke her up from sleep around 4 AM.  This is not the worst headache of her life.  Not thunderclap onset.  Also reports some redness over the left lower extremity.  This has been an ongoing issue and is currently on doxycycline .  Has several days left in his course.  Is following with PCP for this.   The history is provided by the patient and medical records. No language interpreter was used.       Prior to Admission medications   Medication Sig Start Date End Date Taking? Authorizing Provider  clonazePAM  (KLONOPIN ) 0.5 MG tablet Take 0.5 mg by mouth 2 (two) times daily as needed for anxiety. 08/02/23  Yes [provider]  doxycycline  (VIBRA -TABS) 100 MG tablet Take 1 tablet (100 mg total) by mouth 2 (two) times daily. 05/17/24  Yes Tobie Suzzane POUR, MD  metFORMIN  (GLUCOPHAGE ) 1000 MG tablet Take 1 tablet (1,000 mg total) by mouth 2 (two) times daily with a meal. take 1 tablet by mouth 2 times a day with a meal. 11/04/23  Yes Reardon, Benton PARAS, NP  methadone  (DOLOPHINE ) 10 MG/ML solution Take 105 mg by mouth daily.   Yes [provider]  acetaminophen  (TYLENOL ) 500 MG tablet Take 1,000 mg by mouth 2 (two) times daily as needed for moderate pain (pain score 4-6) or headache.    [provider]  albuterol  (VENTOLIN  HFA) 108 (90 Base) MCG/ACT inhaler Inhale 2 puffs into the lungs every 6 (six) hours as needed for wheezing or shortness of breath. 07/17/22   Leath-Warren, Etta PARAS, NP  apixaban  (ELIQUIS ) 5 MG TABS tablet Take 2  tablets (10 mg total) by mouth 2 (two) times daily for 6 days, THEN 1 tablet (5 mg total) 2 (two) times daily. Patient taking differently: 1 tablet (5 mg total) 2 (two) times daily. 09/28/23 05/17/24  Ricky Fines, MD  ARIPiprazole  (ABILIFY ) 20 MG tablet Take 1 tablet (20 mg total) by mouth daily. 04/05/24   Del Wilhelmena Lloyd Sola, FNP  chlorhexidine  (HIBICLENS ) 4 % external liquid Apply topically daily as needed. 05/13/24   Stuart Vernell Norris, PA-C  Continuous Glucose Receiver (FREESTYLE LIBRE 3 READER) DEVI Use the Freestyle Libre 3 Reader Device to monitor blood sugars as directed by the provider. 02/29/24   Therisa Benton PARAS, NP  Continuous Glucose Sensor (DEXCOM G7 SENSOR) MISC Inject 1 Application into the skin as directed. Change sensor every 10 days as directed. 11/04/23   Therisa Benton PARAS, NP  Continuous Glucose Sensor (FREESTYLE LIBRE 3 PLUS SENSOR) MISC 2 each by Does not apply route as directed. Change sensor every 15 days. 02/29/24   Therisa Benton PARAS, NP  Continuous Glucose Transmitter (DEXCOM G6 TRANSMITTER) MISC USE AS DIRECTED. 03/24/23   Therisa Benton PARAS, NP  famotidine  (PEPCID ) 20 MG tablet Take 1 tablet (20 mg total) by mouth at bedtime. 09/28/23   Ricky Fines, MD  ferrous sulfate  325 (65 FE) MG EC tablet Take 1 tablet (325 mg total) by mouth daily with breakfast. 09/13/23   Pennington,  Pleasant HERO, PA-C  glucose blood (ACCU-CHEK GUIDE) test strip Use as instructed to check blood glucose four times daily 12/01/21   Therisa Benton PARAS, NP  hydrOXYzine  (ATARAX ) 10 MG tablet Take 1 tablet (10 mg total) by mouth 3 (three) times daily as needed for itching. 05/17/24   Tobie Suzzane POUR, MD  hydrOXYzine  (VISTARIL ) 50 MG capsule Take 1 capsule (50 mg total) by mouth 3 (three) times daily as needed. 05/25/23   Del Orbe Polanco, Iliana, FNP  hyoscyamine  (ANASPAZ ) 0.125 MG TBDP disintergrating tablet Place 1 tablet (0.125 mg total) under the tongue every 6 (six) hours as needed. 12/13/23    Castaneda Mayorga, Daniel, MD  Insulin  Pen Needle (PEN NEEDLES) 32G X 6 MM MISC 1 each by Does not apply route 3 (three) times daily. 02/26/21   Therisa Benton PARAS, NP  insulin  regular human CONCENTRATED (HUMULIN  R U-500 KWIKPEN) 500 UNIT/ML KwikPen Inject 80 Units into the skin 3 (three) times daily with meals. 03/06/24   Therisa Benton PARAS, NP  lidocaine  (LIDODERM ) 5 % Place 1 patch onto the skin daily. Remove & Discard patch within 12 hours or as directed by MD. DO not APPLY TO INFLAMED , RED SKIN 04/12/24   Antonetta Rollene BRAVO, MD  lidocaine  (XYLOCAINE ) 5 % ointment Apply 1 Application topically as needed. Patient taking differently: Apply 1 Application topically 2 (two) times daily as needed (pain). 04/27/23   Del Orbe Polanco, Iliana, FNP  lisinopril  (ZESTRIL ) 5 MG tablet Take 5 mg by mouth daily.    [provider]  methocarbamol (ROBAXIN) 750 MG tablet Take 750 mg by mouth every 6 (six) hours as needed for muscle spasms.    [provider]  mupirocin  ointment (BACTROBAN ) 2 % Apply 1 Application topically 2 (two) times daily. 05/13/24   Stuart Vernell Norris, PA-C  NARCAN  4 MG/0.1ML LIQD nasal spray kit Place 1 spray into the nose once as needed (OD). 04/07/24   [provider]  nystatin  cream (MYCOSTATIN ) Apply 1 Application topically 2 (two) times daily. 10/22/23   Antonetta Rollene BRAVO, MD  Omega-3 Fatty Acids (ENTERIC FISH OIL) 1000 MG CPDR Take 1,000 mg by mouth 3 (three) times daily.    [provider]  omeprazole  (PRILOSEC) 40 MG capsule TAKE 1 CAPSULE BY MOUTH TWICE DAILY. 05/10/23   Carlan, Chelsea L, NP  ondansetron  (ZOFRAN ) 4 MG tablet Take 1 tablet (4 mg total) by mouth every 8 (eight) hours as needed for nausea or vomiting. 03/08/23   Carlan, Chelsea L, NP  Oxycodone  HCl 10 MG TABS Take 1 tablet (10 mg total) by mouth every 6 (six) hours as needed (pain). 09/28/23   Ricky Fines, MD  pantoprazole  (PROTONIX ) 20 MG tablet Take 20 mg by mouth daily. 12/30/23    [provider]  potassium chloride  (KLOR-CON ) 10 MEQ tablet Take 10 mEq by mouth 2 (two) times daily. 08/10/23   [provider]  propranolol  (INDERAL ) 10 MG tablet Take 1 tablet (10 mg total) by mouth 2 (two) times daily. 09/28/23   Ricky Fines, MD  QUEtiapine  (SEROQUEL ) 25 MG tablet Take 25 mg by mouth at bedtime. 03/07/24   [provider]  tirzepatide  (MOUNJARO ) 7.5 MG/0.5ML Pen Inject 7.5 mg into the skin once a week. 03/06/24   Therisa Benton PARAS, NP  torsemide  (DEMADEX ) 20 MG tablet Take 2 tablets (40 mg total) by mouth daily. 09/28/23   Ricky Fines, MD  triamcinolone  cream (KENALOG ) 0.1 % Apply 1 Application topically 2 (two) times daily.  04/10/24   Stuart Vernell Norris, PA-C  venlafaxine  XR (EFFEXOR  XR) 150 MG 24 hr capsule Take 1 capsule (150 mg total) by mouth daily with breakfast. 04/05/24   Del Orbe Polanco, Iliana, FNP    Allergies: Firvanq  [vancomycin ], Iodine -131, Ivp dye [iodinated contrast media], Toradol [ketorolac tromethamine], Tylenol  [acetaminophen ], Nsaids, Suboxone  [buprenorphine hcl-naloxone  hcl], and Neurontin  [gabapentin ]    Review of Systems  Constitutional:  Negative for chills and fever.  Eyes:  Negative for photophobia and visual disturbance.  Neurological:  Positive for headaches. Negative for syncope.  All other systems reviewed and are negative.   Updated Vital Signs BP 122/75   Pulse 89   Temp 98 F (36.7 C) (Oral)   Resp 18   LMP 05/22/2024 (Within Days)   SpO2 96%   Physical Exam Vitals and nursing note reviewed.  Constitutional:      General: She is not in acute distress.    Appearance: Normal appearance. She is not ill-appearing.  HENT:     Head: Normocephalic and atraumatic.     Nose: Nose normal.  Eyes:     Extraocular Movements: Extraocular movements intact.     Conjunctiva/sclera: Conjunctivae normal.     Pupils: Pupils are equal, round, and reactive to light.  Cardiovascular:     Rate and Rhythm: Normal  rate and regular rhythm.  Pulmonary:     Effort: Pulmonary effort is normal. No respiratory distress.  Musculoskeletal:        General: No deformity. Normal range of motion.     Cervical back: Normal range of motion.  Skin:    Findings: No rash.  Neurological:     General: No focal deficit present.     Mental Status: She is alert and oriented to person, place, and time. Mental status is at baseline.     Cranial Nerves: No cranial nerve deficit.     Comments: Cranial nerves III through XII intact.  Full range of motion in upper and lower extremities with 5/5 strength in extensor and flexor muscle groups.  No pronator drift.  Normal speech.     (all labs ordered are listed, but only abnormal results are displayed) Labs Reviewed  CBC WITH DIFFERENTIAL/PLATELET  BASIC METABOLIC PANEL WITH GFR    EKG: None  Radiology: No results found.   Procedures   Medications Ordered in the ED  prochlorperazine  (COMPAZINE ) injection 10 mg (has no administration in time range)  ondansetron  (ZOFRAN ) injection 4 mg (has no administration in time range)  magnesium  sulfate IVPB 2 g 50 mL (has no administration in time range)  diphenhydrAMINE  (BENADRYL ) 12.5 MG/5ML elixir 12.5 mg (has no administration in time range)    Clinical Course as of 05/28/24 1217  Sun May 28, 2024  1106 Reports ongoing headache despite the migraine cocktail.  Will provide sumatriptan  and obtain CT head. Patient on exam does appear comfortable and is browsing on her phone.  No acute distress noted. [AA]    Clinical Course User Index [AA] Hildegard Loge, PA-C                                 Medical Decision Making Amount and/or Complexity of Data Reviewed Labs: ordered. Radiology: ordered.  Risk Prescription drug management.   Medical Decision Making / ED Course   This patient presents to the ED for concern of headache, this involves an extensive number of treatment options, and is a complaint that carries  with  it a high risk of complications and morbidity.  The differential diagnosis includes migraine headache, tension headache, cluster headache, SAH  MDM: 38 year old female presents today for concern of migraine headache.  Has history of migraine headaches.  This feels similar.  Reports photophobia, and sensitivity to sound.  Started around 4 AM.  Not a thunderclap onset but did wake her up from sleep.  Has progressively worsened.  Labs ordered.  Migraine cocktail ordered.  For reevaluation migraine cocktail did not help much. Neurological exam without any focal deficits.  Sumatriptan  ordered, CT head ordered.  CT head without acute concern.  Resting comfortably on reevaluation.  Discharged in stable condition.  Discussed close follow-up with PCP. Patient is in agreement with plan.  Husband is at bedside who also voices understanding.  Patient at the beginning of the visit did ask nursing that only fentanyl  or Dilaudid  typically works.  I advised her that we have other modalities that we can use and we will try to avoid opioid medicines but we will try our best to control her headache. Seems to have improved after sumatriptan .  Lab Tests: -I ordered, reviewed, and interpreted labs.   The pertinent results include:   Labs Reviewed  CBC WITH DIFFERENTIAL/PLATELET  BASIC METABOLIC PANEL WITH GFR      EKG  EKG Interpretation Date/Time:    Ventricular Rate:    PR Interval:    QRS Duration:    QT Interval:    QTC Calculation:   R Axis:      Text Interpretation:           Imaging Studies ordered: I ordered imaging studies including CT head without contrast I independently visualized and interpreted imaging. I agree with the radiologist interpretation   Medicines ordered and prescription drug management: Meds ordered this encounter  Medications   prochlorperazine  (COMPAZINE ) injection 10 mg   ondansetron  (ZOFRAN ) injection 4 mg   magnesium  sulfate IVPB 2 g 50 mL    diphenhydrAMINE  (BENADRYL ) 12.5 MG/5ML elixir 12.5 mg    -I have reviewed the patients home medicines and have made adjustments as needed   Reevaluation: After the interventions noted above, I reevaluated the patient and found that they have :improved  Co morbidities that complicate the patient evaluation  Past Medical History:  Diagnosis Date   Acute encephalopathy 08/31/2020   Adjustment disorder with depressed mood 09/07/2020   Anxiety    Asthma    Chronic abdominal pain    Chronic back pain    Chronic prescription opiate use 02/16/2022   Cirrhosis (HCC)    Cirrhosis (HCC)    COPD (chronic obstructive pulmonary disease) (HCC)    Depression    Diabetes mellitus without complication (HCC)    Diabetes mellitus, type II (HCC)    Diverticulosis    DVT (deep venous thrombosis) (HCC) 02/04/2023   taking lovenox  prior to procedure   Fatty liver 03/07/2020   GERD (gastroesophageal reflux disease)    Hepatitis C    Hernia, abdominal    Hyperglycemia due to type 2 diabetes mellitus (HCC) 05/24/2020   Hyperlipidemia    IBS (irritable bowel syndrome) 02/16/2022   Insomnia    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)    Lupus    Migraine headache    Morbid obesity (HCC) 05/24/2020   Neuropathy    Nocturnal seizures (HCC)    Pain of upper abdomen 07/28/2021   Peptic ulcer    Spleen enlarged    Splenomegaly  Dispostion: Discharged in stable condition.  Return precaution discussed.  Patient voices understanding and is in agreement with plan.    Final diagnoses:  Migraine without status migrainosus, not intractable, unspecified migraine type    ED Discharge Orders     None          Hildegard Loge, PA-C 05/28/24 1220    Charlyn Sora, MD 05/29/24 1550

## 2024-05-28 NOTE — ED Notes (Signed)
 Patient transported to CT

## 2024-05-28 NOTE — Discharge Instructions (Signed)
 Take over-the-counter Excedrin, Tylenol , Motrin  as you need to for headaches.  Follow-up with your primary care doctor.  Return for any concerning symptoms.

## 2024-05-28 NOTE — ED Triage Notes (Signed)
 Pt arrives POV, husband drove her here. Reports migraine began 040 with n/v. Hasn't had a migraine in years. States fentanyl  and dilaudid  are usual treatments w nausea medicine. Exacerbated by sound. Also reports LLE swelling/redness x1 month. Reports hx of blood clot in that leg. States previously seen by outpatient providers who suggested it was just an irritation.   Hx cirrhosis, DM

## 2024-05-29 ENCOUNTER — Ambulatory Visit: Payer: Self-pay

## 2024-05-29 NOTE — Telephone Encounter (Signed)
 FYI Only or Action Required?: FYI only for provider.  Patient was last seen in primary care on 05/17/2024 by Tobie Suzzane POUR, MD.  Called Nurse Triage reporting Cellulitis.  Symptoms began several weeks ago.  Interventions attempted: OTC medications: Tylenol  and Prescription medications: doxycycline  (did not take as prescribed), mupirocin , hydroxyzine .  Symptoms are: left lower leg redness, swelling, itching, pain, drainage with red streaksgradually worsening.  Triage Disposition: See Physician Within 24 Hours (overriding See HCP Within 4 Hours (Or PCP Triage))  Patient/caregiver understands and will follow disposition?: Yes              Copied from CRM [id number]. Topic: Clinical - Red Word Triage >> May 29, 2024 10:40 AM Rea BROCKS wrote: Red Word that prompted transfer to Nurse Triage:  Was in the ER this weekend Spot on leg, not healing,  Blood clot in it  pain, swelling, redness, itching, has big scab over it.  Patient said she is taking doxycycline  Reason for Disposition  [1] Taking antibiotic > 24 hours AND [2] red streak (or line) runs from area of infection  Answer Assessment - Initial Assessment Questions Patient seen in ED for migraine and she states she tried to address her leg infection with the provider but he told her to contact her PCP. Patient self scheduled for tomorrow morning, no sooner appts available. Patient concerned she will lose her leg from the infection due to being a diabetic. Offered appt at urgent care but she states she will wait til tomorrow to see NP Leita.  1. SYMPTOM: What's the main symptom you're concerned about? (e.g., redness, swelling, pain, fever, weakness)     Redness, pain, swelling and itching.  2. CELLULITIS LOCATION: Where is the cellulitis located? (e.g., hand, arm, foot, leg, face)     Left leg, on shin and ankle.  3. CELLULITIS SIZE: What is the size of the red area? (e.g., inches, centimeters; compare to size of a  coin).     About an inch wide and 3-4 inches long.  4. BETTER-SAME-WORSE: Are you getting better, staying the same, or getting worse compared to the day you started the antibiotics?      Same or worse.  5. PAIN: Do you have any pain?  If Yes, ask: How bad is the pain?  (e.g., Scale 0-10; mild, moderate, or severe)     10/10 it's like a sharp pain, fire ,burning  6. FEVER: Do you have a fever? If Yes, ask: What is it, how was it measured and when did it start?     She states she has not checked.  7. OTHER SYMPTOMS: Do you have any other symptoms? (e.g., pus coming from a wound, red streaks, weakness)     Itching, red streaks coming up leg, little pus draining from scab.  8. DIAGNOSIS DATE: When was the cellulitis diagnosed? By whom?      05/17/24.  9. ANTIBIOTIC NAME: What antibiotic(s) are you taking?  How many times per day? (Be sure the patient is taking the antibiotic as directed).      Doxycycline  twice daily. Patient did not take as directed, missed some doses and was supposed to finish on 05/23/24 but has one left to take.  10. ANTIBIOTIC DATE: When was the antibiotic started?       05/17/24.  11. FOLLOW-UP APPOINTMENT: Do you have a follow-up appointment with your doctor?       No.  Protocols used: Cellulitis on Antibiotic Follow-up Call-[facility]

## 2024-05-30 ENCOUNTER — Ambulatory Visit
Admission: EM | Admit: 2024-05-30 | Discharge: 2024-05-30 | Disposition: A | Discharge status: Home / Self Care | Payer: MEDICAID | Attending: Nurse Practitioner | Admitting: Nurse Practitioner

## 2024-05-30 ENCOUNTER — Encounter: Payer: Self-pay | Admitting: Emergency Medicine

## 2024-05-30 ENCOUNTER — Ambulatory Visit: Payer: MEDICAID

## 2024-05-30 DIAGNOSIS — Z86718 Personal history of other venous thrombosis and embolism: Secondary | ICD-10-CM | POA: Diagnosis not present

## 2024-05-30 DIAGNOSIS — I872 Venous insufficiency (chronic) (peripheral): Secondary | ICD-10-CM

## 2024-05-30 MED ORDER — HYDROXYZINE HCL 10 MG PO TABS: 10.0000 mg | ORAL_TABLET | Freq: Three times a day (TID) | ORAL | 0 refills | Status: DC | PRN

## 2024-05-30 MED ORDER — TRIAMCINOLONE ACETONIDE 0.1 % EX CREA
1.0000 | TOPICAL_CREAM | Freq: Two times a day (BID) | CUTANEOUS | 0 refills | Status: DC
Start: 2024-05-30 — End: 2024-07-05

## 2024-05-30 NOTE — Discharge Instructions (Addendum)
 Take medication as prescribed. You may take over-the-counter Tylenol  as needed for pain or discomfort. Recommend using compression socks to help improve your circulation. Elevate the lower extremity is much as possible to help decrease swelling. Follow-up with your PCP tomorrow as scheduled. Follow-up as needed.

## 2024-05-30 NOTE — ED Provider Notes (Signed)
 [facility]    CSN: [id number] Arrival date & time: 05/30/24  1047      History   Chief Complaint Chief Complaint  Patient presents with   red spot on left lower leg    HPI Jaime [name] is a 38 y.o. female.   The history is provided by the patient.   Patient presents for complaints of ongoing swelling, itching, and redness to the left lower leg.  Patient states symptoms have been present for the past month.  She was recently seen by her PCP on 05/17/2024 and started on doxycycline  and hydroxyzine .  She states since being on the medications, she has not noticed any improvement of her symptoms.  She also states that prior to starting the doxycycline , she was on another antibiotic.  Patient reports that she does have a blood clot in the left lower leg, she is currently taking Eliquis .  Patient also endorses numbness and tingling in the left lower leg.  Patient reports prior history of diabetes, most recent A1c was 6.5.  Past Medical History:  Diagnosis Date   Acute encephalopathy 08/31/2020   Adjustment disorder with depressed mood 09/07/2020   Anxiety    Asthma    Chronic abdominal pain    Chronic back pain    Chronic prescription opiate use 02/16/2022   Cirrhosis (HCC)    Cirrhosis (HCC)    COPD (chronic obstructive pulmonary disease) (HCC)    Depression    Diabetes mellitus without complication (HCC)    Diabetes mellitus, type II (HCC)    Diverticulosis    DVT (deep venous thrombosis) (HCC) 02/04/2023   taking lovenox  prior to procedure   Fatty liver 03/07/2020   GERD (gastroesophageal reflux disease)    Hepatitis C    Hernia, abdominal    Hyperglycemia due to type 2 diabetes mellitus (HCC) 05/24/2020   Hyperlipidemia    IBS (irritable bowel syndrome) 02/16/2022   Insomnia    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)    Lupus    Migraine headache    Morbid obesity (HCC) 05/24/2020   Neuropathy    Nocturnal seizures (HCC)     Pain of upper abdomen 07/28/2021   Peptic ulcer    Spleen enlarged    Splenomegaly     Patient Active Problem List   Diagnosis Date Noted   At risk for sleep apnea 05/17/2024   Left leg pain 05/15/2024   Encounter for examination following treatment at hospital 05/15/2024   Hx of bipolar disorder 04/05/2024   Lumbar pain 12/09/2023   Cellulitis and abscess of buttock 10/22/2023   Type 2 diabetes mellitus with other specified complication (HCC) 10/22/2023   Encounter for IUD insertion 10/06/2023   Hospital discharge follow-up 10/05/2023   Depression, major, single episode, severe (HCC) 10/05/2023   Acute pulmonary embolism (HCC) 09/25/2023   Elevated d-dimer 09/16/2023   Elevated brain natriuretic peptide (BNP) level 09/16/2023   Hypoalbuminemia due to protein-calorie malnutrition (HCC) 09/16/2023   Acute deep vein thrombosis (DVT) of left lower extremity (HCC) 09/15/2023   Left leg cellulitis 09/14/2023   Deep vein thrombophlebitis of left leg (HCC) 08/26/2023   Lumbar compression fracture (HCC) 02/10/2023   Left leg DVT (HCC) 02/05/2023   At risk for arrhythmia 01/26/2023   Chronic back pain 01/01/2023   Insomnia 01/01/2023   Depression, recurrent (HCC) 01/01/2023   Constipation 05/18/2022   IBS (irritable bowel syndrome) 02/16/2022   Chronic prescription opiate use 02/16/2022   Gastroesophageal reflux  disease 10/23/2021   Nausea and vomiting 10/23/2021   Diarrhea 10/23/2021   Pain of upper abdomen 07/28/2021   Adjustment disorder with depressed mood 09/07/2020   Dyspnea 07/29/2020   Anasarca 05/24/2020   Hyperglycemia due to type 2 diabetes mellitus (HCC) 05/24/2020   Morbid obesity (HCC) 05/24/2020   Elevated serum immunoglobulin free light chains 03/08/2020   Fatty liver 03/07/2020   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   Splenomegaly 03/07/2020   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   Pressure injury of skin 01/29/2019   Uncontrolled diabetes mellitus  09/19/2018   Hyperglycemia 09/17/2018   Acute respiratory failure with hypoxia (HCC) 09/15/2018   Anxiety 09/15/2018   Chronic abdominal pain 09/15/2018   Diabetes mellitus without complication (HCC) 09/15/2018   Hepatitis C 09/15/2018   Peptic ulcer 09/15/2018   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) 09/15/2018   Cirrhosis (HCC) 09/15/2018   Tobacco use disorder 05/22/2018   CAP (community acquired pneumonia) 07/17/2016   Chronic pain disorder 07/17/2016   Depression 05/24/2013    Past Surgical History:  Procedure Laterality Date   BIOPSY  11/25/2021   Procedure: BIOPSY;  Surgeon: Eartha Angelia Sieving, MD;  [facility];  Service: Gastroenterology;;   CHOLECYSTECTOMY     COLONOSCOPY WITH PROPOFOL  N/A 11/25/2021   Procedure: COLONOSCOPY WITH PROPOFOL ;  Surgeon: Eartha Angelia Sieving, MD;  Location: [facility];  Service: Gastroenterology;  Laterality: N/A;  805   COLONOSCOPY WITH PROPOFOL  N/A 03/31/2022   Procedure: COLONOSCOPY WITH PROPOFOL ;  Surgeon: Eartha Angelia Sieving, MD;  Location: [facility];  Service: Gastroenterology;  Laterality: N/A;  730   ESOPHAGOGASTRODUODENOSCOPY  06/2020   done at [facility], candida in upper esophagus (treated with diflucan ), ulcerative esophagitis at GE junction, gastritis in stomach, single ulcer in duodenal bulb, with duodenal mucosa showing no abnormality. No presence of varices   ESOPHAGOGASTRODUODENOSCOPY (EGD) WITH PROPOFOL  N/A 11/25/2021   Procedure: ESOPHAGOGASTRODUODENOSCOPY (EGD) WITH PROPOFOL ;  Surgeon: Eartha Angelia Sieving, MD;  [facility];  Service: Gastroenterology;  Laterality: N/A;   ESOPHAGOGASTRODUODENOSCOPY (EGD) WITH PROPOFOL  N/A 03/31/2022   Procedure: ESOPHAGOGASTRODUODENOSCOPY (EGD) WITH PROPOFOL ;  Surgeon: Eartha Angelia Sieving, MD;  Location: [facility];  Service: Gastroenterology;  Laterality: N/A;   IVC FILTER REMOVAL N/A 09/16/2023   Procedure: IVC  FILTER REMOVAL;  Surgeon: Pearline Norman RAMAN, MD;  Location: [facility] CV [facility];  Service: Cardiovascular;  Laterality: N/A;   IVC FILTER REMOVAL N/A 02/14/2024   Procedure: IVC FILTER REMOVAL;  Surgeon: Pearline Norman RAMAN, MD;  Location: [facility] INV[facility] CV LAB;  Service: Cardiovascular;  Laterality: N/A;   PERIPHERAL VASCULAR THROMBECTOMY Left 09/16/2023   Procedure: PERIPHERAL VASCULAR THROMBECTOMY;  Surgeon: Pearline Norman RAMAN, MD;  Location: [facility] INVASIVE CV LAB;  Service: Cardiovascular;  Laterality: Left;    OB History     Gravida  0   Para  0   Term  0   Preterm  0   AB  0   Living  0      SAB  0   IAB  0   Ectopic  0   Multiple  0   Live Births  0            Home Medications    Prior to Admission medications   Medication Sig Start Date End Date Taking? Authorizing Provider  acetaminophen  (TYLENOL ) 500 MG tablet Take 1,000 mg by mouth 2 (two) times daily as needed for moderate pain (pain score 4-6) or headache.  [provider]  albuterol  (VENTOLIN  HFA) 108 (90 Base) MCG/ACT inhaler Inhale 2 puffs into the lungs every 6 (six) hours as needed for wheezing or shortness of breath. 07/17/22   Leath-Warren, Etta PARAS, NP  apixaban  (ELIQUIS ) 5 MG TABS tablet Take 2 tablets (10 mg total) by mouth 2 (two) times daily for 6 days, THEN 1 tablet (5 mg total) 2 (two) times daily. Patient taking differently: 1 tablet (5 mg total) 2 (two) times daily. 09/28/23 05/17/24  Ricky Fines, MD  ARIPiprazole  (ABILIFY ) 20 MG tablet Take 1 tablet (20 mg total) by mouth daily. 04/05/24   Del Wilhelmena Lloyd Sola, FNP  chlorhexidine  (HIBICLENS ) 4 % external liquid Apply topically daily as needed. 05/13/24   Stuart Vernell Norris, PA-C  clonazePAM  (KLONOPIN ) 0.5 MG tablet Take 0.5 mg by mouth 2 (two) times daily as needed for anxiety. 08/02/23   [provider]  Continuous Glucose Receiver (FREESTYLE LIBRE 3 READER) DEVI Use the Freestyle Libre 3 Reader Device to monitor blood  sugars as directed by the provider. 02/29/24   Therisa Benton PARAS, NP  Continuous Glucose Sensor (DEXCOM G7 SENSOR) MISC Inject 1 Application into the skin as directed. Change sensor every 10 days as directed. 11/04/23   Therisa Benton PARAS, NP  Continuous Glucose Sensor (FREESTYLE LIBRE 3 PLUS SENSOR) MISC 2 each by Does not apply route as directed. Change sensor every 15 days. 02/29/24   Therisa Benton PARAS, NP  Continuous Glucose Transmitter (DEXCOM G6 TRANSMITTER) MISC USE AS DIRECTED. 03/24/23   Therisa Benton PARAS, NP  doxycycline  (VIBRA -TABS) 100 MG tablet Take 1 tablet (100 mg total) by mouth 2 (two) times daily. 05/17/24   Tobie Suzzane POUR, MD  famotidine  (PEPCID ) 20 MG tablet Take 1 tablet (20 mg total) by mouth at bedtime. 09/28/23   Ricky Fines, MD  ferrous sulfate  325 (65 FE) MG EC tablet Take 1 tablet (325 mg total) by mouth daily with breakfast. 09/13/23   Lamon Herter M, PA-C  glucose blood (ACCU-CHEK GUIDE) test strip Use as instructed to check blood glucose four times daily 12/01/21   Therisa Benton PARAS, NP  hydrOXYzine  (ATARAX ) 10 MG tablet Take 1 tablet (10 mg total) by mouth 3 (three) times daily as needed for itching. 05/17/24   Tobie Suzzane POUR, MD  hydrOXYzine  (VISTARIL ) 50 MG capsule Take 1 capsule (50 mg total) by mouth 3 (three) times daily as needed. 05/25/23   Del Orbe Polanco, Iliana, FNP  hyoscyamine  (ANASPAZ ) 0.125 MG TBDP disintergrating tablet Place 1 tablet (0.125 mg total) under the tongue every 6 (six) hours as needed. 12/13/23   Castaneda Mayorga, Daniel, MD  Insulin  Pen Needle (PEN NEEDLES) 32G X 6 MM MISC 1 each by Does not apply route 3 (three) times daily. 02/26/21   Therisa Benton PARAS, NP  insulin  regular human CONCENTRATED (HUMULIN  R U-500 KWIKPEN) 500 UNIT/ML KwikPen Inject 80 Units into the skin 3 (three) times daily with meals. 03/06/24   Therisa Benton PARAS, NP  lidocaine  (LIDODERM ) 5 % Place 1 patch onto the skin daily. Remove & Discard patch within 12 hours or as  directed by MD. DO not APPLY TO INFLAMED , RED SKIN 04/12/24   Antonetta Rollene BRAVO, MD  lidocaine  (XYLOCAINE ) 5 % ointment Apply 1 Application topically as needed. Patient taking differently: Apply 1 Application topically 2 (two) times daily as needed (pain). 04/27/23   Del Orbe Polanco, Iliana, FNP  lisinopril  (ZESTRIL ) 5 MG tablet Take 5 mg by mouth daily.  [provider]  metFORMIN  (GLUCOPHAGE ) 1000 MG tablet Take 1 tablet (1,000 mg total) by mouth 2 (two) times daily with a meal. take 1 tablet by mouth 2 times a day with a meal. 11/04/23   Therisa Benton PARAS, NP  methadone  (DOLOPHINE ) 10 MG/ML solution Take 105 mg by mouth daily.    [provider]  methocarbamol (ROBAXIN) 750 MG tablet Take 750 mg by mouth every 6 (six) hours as needed for muscle spasms.    [provider]  mupirocin  ointment (BACTROBAN ) 2 % Apply 1 Application topically 2 (two) times daily. 05/13/24   Stuart Vernell Norris, PA-C  NARCAN  4 MG/0.1ML LIQD nasal spray kit Place 1 spray into the nose once as needed (OD). 04/07/24   [provider]  nystatin  cream (MYCOSTATIN ) Apply 1 Application topically 2 (two) times daily. 10/22/23   Antonetta Rollene BRAVO, MD  Omega-3 Fatty Acids (ENTERIC FISH OIL) 1000 MG CPDR Take 1,000 mg by mouth 3 (three) times daily.    [provider]  omeprazole  (PRILOSEC) 40 MG capsule TAKE 1 CAPSULE BY MOUTH TWICE DAILY. 05/10/23   Carlan, Chelsea L, NP  ondansetron  (ZOFRAN ) 4 MG tablet Take 1 tablet (4 mg total) by mouth every 8 (eight) hours as needed for nausea or vomiting. 03/08/23   Carlan, Chelsea L, NP  Oxycodone  HCl 10 MG TABS Take 1 tablet (10 mg total) by mouth every 6 (six) hours as needed (pain). 09/28/23   Ricky Fines, MD  pantoprazole  (PROTONIX ) 20 MG tablet Take 20 mg by mouth daily. 12/30/23   [provider]  potassium chloride  (KLOR-CON ) 10 MEQ tablet Take 10 mEq by mouth 2 (two) times daily. 08/10/23   [provider]  propranolol   (INDERAL ) 10 MG tablet Take 1 tablet (10 mg total) by mouth 2 (two) times daily. 09/28/23   Ricky Fines, MD  QUEtiapine  (SEROQUEL ) 25 MG tablet Take 25 mg by mouth at bedtime. 03/07/24   [provider]  tirzepatide  (MOUNJARO ) 7.5 MG/0.5ML Pen Inject 7.5 mg into the skin once a week. 03/06/24   Therisa Benton PARAS, NP  torsemide  (DEMADEX ) 20 MG tablet Take 2 tablets (40 mg total) by mouth daily. 09/28/23   Ricky Fines, MD  triamcinolone  cream (KENALOG ) 0.1 % Apply 1 Application topically 2 (two) times daily. 04/10/24   Stuart Vernell Norris, PA-C  venlafaxine  XR (EFFEXOR  XR) 150 MG 24 hr capsule Take 1 capsule (150 mg total) by mouth daily with breakfast. 04/05/24   Del Wilhelmena Lloyd Sola, FNP    Family History Family History  Problem Relation Age of Onset   Hyperlipidemia Maternal Grandfather     Social History Social History   Tobacco Use   Smoking status: Every Day    Current packs/day: 0.50    Types: Cigarettes    Passive exposure: Current   Smokeless tobacco: Never  Vaping Use   Vaping status: Every Day   Substances: Nicotine , Flavoring  Substance Use Topics   Alcohol use: No   Drug use: No     Allergies   Firvanq  [vancomycin ], Iodine -131, Ivp dye [iodinated contrast media], Toradol [ketorolac tromethamine], Tylenol  [acetaminophen ], Nsaids, Suboxone  [buprenorphine hcl-naloxone  hcl], and Neurontin  [gabapentin ]   Review of Systems Review of Systems Per HPI  Physical Exam Triage Vital Signs ED Triage Vitals  Encounter Vitals Group     BP 05/30/24 1100 132/78     Girls Systolic BP Percentile --      Girls Diastolic BP Percentile --  Boys Systolic BP Percentile --      Boys Diastolic BP Percentile --      Pulse Rate 05/30/24 1100 100     Resp 05/30/24 1100 18     Temp 05/30/24 1100 98.6 F (37 C)     Temp Source 05/30/24 1100 Oral     SpO2 05/30/24 1100 96 %     Weight --      Height --      Head Circumference --      Peak Flow --      Pain  Score 05/30/24 1102 9     Pain Loc --      Pain Education --      Exclude from Growth Chart --    No data found.  Updated Vital Signs BP 132/78 (BP Location: Right Arm)   Pulse 100   Temp 98.6 F (37 C) (Oral)   Resp 18   LMP 05/22/2024 (Within Days)   SpO2 96%   Visual Acuity Right Eye Distance:   Left Eye Distance:   Bilateral Distance:    Right Eye Near:   Left Eye Near:    Bilateral Near:     Physical Exam Vitals and nursing note reviewed.  Constitutional:      General: She is not in acute distress.    Appearance: Normal appearance.  HENT:     Head: Normocephalic.  Eyes:     Extraocular Movements: Extraocular movements intact.     Pupils: Pupils are equal, round, and reactive to light.  Cardiovascular:     Rate and Rhythm: Normal rate and regular rhythm.     Pulses: Normal pulses.     Heart sounds: Normal heart sounds.  Pulmonary:     Effort: Pulmonary effort is normal. No respiratory distress.     Breath sounds: Normal breath sounds. No stridor. No wheezing, rhonchi or rales.  Abdominal:     General: Bowel sounds are normal.     Palpations: Abdomen is soft.  Musculoskeletal:     Cervical back: Normal range of motion.     Left lower leg: Swelling and tenderness present. No edema.     Comments: Erythema and warmth noted to the left lower leg.  Skin of left lower extremity is taut and tender to palpation.  Redness extends from the, mid shin area down towards the ankle.  Abrasion noted to the anterior mid left lower leg, healing, no signs of infection present.    Skin:    General: Skin is warm and dry.  Neurological:     General: No focal deficit present.     Mental Status: She is alert and oriented to person, place, and time.  Psychiatric:        Mood and Affect: Mood normal.        Behavior: Behavior normal.      UC Treatments / Results  Labs (all labs ordered are listed, but only abnormal results are displayed) Labs Reviewed - No data to  display  EKG   Radiology   Procedures Procedures (including critical care time)  Medications Ordered in UC Medications - No data to display  Initial Impression / Assessment and Plan / UC Course  I have reviewed the triage vital signs and the nursing notes.  Pertinent labs & imaging results that were available during my care of the patient were reviewed by me and considered in my medical decision making (see chart for details).  Concern for stasis dermatitis versus cellulitis given  patient has been on 2 different antibiotics with minimal relief of her symptoms.  Patient also with history of DVT in the left lower extremity which most likely is impacting her circulation.  She does also endorse some numbness and tingling in the left lower extremity.  Will treat with topical triamcinolone  cream 0.1% to see if this helps with inflammation and redness.  Hydroxyzine  10 mg also provided for itching.  Will have patient complete course of doxycycline  and continue hydroxyzine  for itching.  Patient was given supportive care recommendations to include keeping the skin moist, wearing compression socks, and elevating the lower extremity to help reduce swelling.  Patient is scheduled to see her PCP tomorrow.  Patient was in agreement with this plan of care and verbalizes understanding.  All questions were answered.  Patient stable for discharge.   Final Clinical Impressions(s) / UC Diagnoses   Final diagnoses:  None   Discharge Instructions   None    ED Prescriptions   None    PDMP not reviewed this encounter.   Gilmer Etta PARAS, NP 05/30/24 1128

## 2024-05-30 NOTE — ED Triage Notes (Signed)
 States has cellulitis on left lower leg. Currently taking doxycycline  for this.  States she has a red spot on that leg she is worried about x 1 month.  States she does have a blood clot in that leg (was told this last year).

## 2024-05-31 ENCOUNTER — Ambulatory Visit: Payer: Self-pay

## 2024-05-31 ENCOUNTER — Ambulatory Visit: Payer: MEDICAID

## 2024-05-31 ENCOUNTER — Ambulatory Visit (HOSPITAL_BASED_OUTPATIENT_CLINIC_OR_DEPARTMENT_OTHER): Payer: MEDICAID | Admitting: Nurse Practitioner

## 2024-05-31 VITALS — BP 119/76 | HR 87 | Ht 70.0 in | Wt 223.0 lb

## 2024-05-31 DIAGNOSIS — F322 Major depressive disorder, single episode, severe without psychotic features: Secondary | ICD-10-CM

## 2024-05-31 DIAGNOSIS — L03116 Cellulitis of left lower limb: Secondary | ICD-10-CM | POA: Diagnosis not present

## 2024-05-31 MED ORDER — SILVER SULFADIAZINE 1 % EX CREA: 1.0000 | TOPICAL_CREAM | Freq: Two times a day (BID) | CUTANEOUS | 1 refills | Status: DC

## 2024-05-31 MED ORDER — ARIPIPRAZOLE 20 MG PO TABS: 20.0000 mg | ORAL_TABLET | Freq: Every day | ORAL | 3 refills | Status: AC

## 2024-05-31 MED ORDER — VENLAFAXINE HCL ER 150 MG PO CP24: 150.0000 mg | ORAL_CAPSULE | Freq: Every day | ORAL | 3 refills | Status: AC

## 2024-05-31 NOTE — Telephone Encounter (Signed)
 Patient scheduled today

## 2024-05-31 NOTE — Telephone Encounter (Signed)
 FYI Only or Action Required?: FYI only for provider.  Patient was last seen in primary care on 05/17/2024 by Tobie Suzzane POUR, MD.  Called Nurse Triage reporting Depression.  Symptoms began several weeks ago.  Interventions attempted: Rest, hydration, or home remedies.  Symptoms are: unchanged.  Triage Disposition: See Physician Within 24 Hours-was already scheduled for an acute appointment today in the office.   Patient/caregiver understands and will follow disposition?: Yes  Copied from CRM [id number]. Topic: Clinical - Red Word Triage >> May 31, 2024 11:41 AM Jaime [name] wrote: Red Word that prompted transfer to Nurse Triage: really depressed, requesting to be put on some medication, has an appointment today. Reason for Disposition  [1] Depression AND [2] getting worse (e.g., sleeping poorly, less able to do activities of daily living)  Answer Assessment - Initial Assessment Questions 1. CONCERN: What happened that made you call today?     Patient calling concerned with feelings of depression 2. DEPRESSION SYMPTOM SCREENING: How are you feeling overall? (e.g., decreased energy, increased sleeping or difficulty sleeping, difficulty concentrating, feelings of sadness, guilt, hopelessness, or worthlessness)     Decreased energy, difficulty sleeping, difficulty concentrating 3. RISK OF HARM - SUICIDAL IDEATION:  Do you ever have thoughts of hurting or killing yourself?  (e.g., yes, no, no but preoccupation with thoughts about death)     no 4. RISK OF HARM - HOMICIDAL IDEATION:  Do you ever have thoughts of hurting or killing someone else?  (e.g., yes, no, no but preoccupation with thoughts about death)     no 5. FUNCTIONAL IMPAIRMENT: How have things been going for you overall? Have you had more difficulty than usual doing your normal daily activities?  (e.g., better, same, worse; self-care, school, work, interactions)     Having a harder time getting stuff done during the day.  6.  SUPPORT: Who is with you now? Who do you live with? Do you have family or friends who you can talk to?      Talks with mother 7. THERAPIST: Do you have a counselor or therapist? If Yes, ask: What is their name?     No therapist 8. STRESSORS: Has there been any new stress or recent changes in your life?     death of grandfather in Jan 05, 2025has caused definite stress. Patient is audibly upset when speaking about this.   9. ALCOHOL USE OR SUBSTANCE USE (DRUG USE): Do you drink alcohol or use any illegal drugs?     no 10. OTHER: Do you have any other physical symptoms right now? (e.g., fever)       Shortness of breath 11. PREGNANCY: Is there any chance you are pregnant? When was your last menstrual period?       No  Patient is scheduled for an appointment already today with PCP office. Patient is hoping to get started on medication for depression. Patient is given information for behavioral health urgent care located in [place], [place].  Protocols used: Depression-A-AH

## 2024-05-31 NOTE — Progress Notes (Signed)
 Established Patient Office Visit  Subjective   Patient ID: Jaime [name], female    DOB: 06/14/1986  Age: 38 y.o. MRN: [id number]  Chief Complaint  Patient presents with   Depression   spot    Spot on leg, painful and itchy     HPI Pt was seen at the urgent care yesterday for the following:   Jaime [name] is a 38 y.o. female who presents for complaints of ongoing swelling, itching, and redness to the left lower leg.  Patient states symptoms have been present for the past month.  She was recently seen by her PCP on 05/17/2024 and started on doxycycline  and hydroxyzine .  She states since being on the medications, she has not noticed any improvement of her symptoms.  She also states that prior to starting the doxycycline , she was on another antibiotic.  Patient reports that she does have a blood clot in the left lower leg, she is currently taking Eliquis .  Patient also endorses numbness and tingling in the left lower leg.  Patient reports prior history of diabetes, most recent A1c was 6.5.   ROS    Objective:     BP 119/76   Pulse 87   Ht 5' 10 (1.778 m)   Wt 223 lb (101.2 kg)   LMP 05/22/2024 (Within Days)   SpO2 98%   BMI 32.00 kg/m  BP Readings from Last 3 Encounters:  05/31/24 119/76  05/30/24 132/78  05/28/24 123/84   Wt Readings from Last 3 Encounters:  05/31/24 223 lb (101.2 kg)  05/28/24 225 lb 15.5 oz (102.5 kg)  05/17/24 226 lb (102.5 kg)     Physical Exam Vitals and nursing note reviewed.  Constitutional:      Appearance: Normal appearance. She is obese.  HENT:     Head: Normocephalic.  Eyes:     Extraocular Movements: Extraocular movements intact.     Pupils: Pupils are equal, round, and reactive to light.  Cardiovascular:     Rate and Rhythm: Normal rate and regular rhythm.  Pulmonary:     Effort: Pulmonary effort is normal.     Breath sounds: Normal breath sounds.  Musculoskeletal:     Cervical back: Normal range of motion and neck supple.   Skin:    Findings: Erythema (left lower leg) and lesion (left lower leg) present.  Neurological:     Mental Status: She is alert and oriented to person, place, and time.  Psychiatric:        Mood and Affect: Mood normal.        Thought Content: Thought content normal.      No results found for any visits on 05/31/24.    The 10-year ASCVD risk score (Arnett DK, et al., 2019) is: 4.8%    Assessment & Plan:   Problem List Items Addressed This Visit       Other   Left leg cellulitis - Primary   Defer any additional oral antibiotics as she has been on 2 rounds recently as well as oral steroid.  Erythema is mild at this time, no open wounds or oozing.  Will add topical Silvadene  cream.  Recommend elevating leg sitting to keep swelling down.      Relevant Medications   silver  sulfADIAZINE  (SILVADENE ) 1 % cream   Depression, major, single episode, severe (HCC)   Uncontrolled, not suicidal or homicidal, needs psych management.  Agreed to refill Abilify  20 mg and Effexor  150 mg at this time.  Advised  to watch for worsening of anxiety and/or depression.  Commend follow-up in 2 to 3 months or sooner if needed.      Relevant Medications   ARIPiprazole  (ABILIFY ) 20 MG tablet   venlafaxine  XR (EFFEXOR  XR) 150 MG 24 hr capsule    No follow-ups on file.    Jaime Longs, FNP

## 2024-06-06 NOTE — Assessment & Plan Note (Signed)
 Defer any additional oral antibiotics as she has been on 2 rounds recently as well as oral steroid.  Erythema is mild at this time, no open wounds or oozing.  Will add topical Silvadene  cream.  Recommend elevating leg sitting to keep swelling down.

## 2024-06-06 NOTE — Assessment & Plan Note (Signed)
 Uncontrolled, not suicidal or homicidal, needs psych management.  Agreed to refill Abilify  20 mg and Effexor  150 mg at this time.  Advised to watch for worsening of anxiety and/or depression.  Commend follow-up in 2 to 3 months or sooner if needed.

## 2024-06-09 NOTE — Therapy (Deleted)
 OUTPATIENT PHYSICAL THERAPY THORACOLUMBAR EVALUATION   Patient Name: Jaime [name] MRN: [id number] DOB:Nov 12, 1985, 38 y.o., female Today's Date: 06/09/2024  END OF SESSION:   Past Medical History:  Diagnosis Date   Acute encephalopathy 08/31/2020   Adjustment disorder with depressed mood 09/07/2020   Anxiety    Asthma    Chronic abdominal pain    Chronic back pain    Chronic prescription opiate use 02/16/2022   Cirrhosis (HCC)    Cirrhosis (HCC)    COPD (chronic obstructive pulmonary disease) (HCC)    Depression    Diabetes mellitus without complication (HCC)    Diabetes mellitus, type II (HCC)    Diverticulosis    DVT (deep venous thrombosis) (HCC) 02/04/2023   taking lovenox  prior to procedure   Fatty liver 03/07/2020   GERD (gastroesophageal reflux disease)    Hepatitis C    Hernia, abdominal    Hyperglycemia due to type 2 diabetes mellitus (HCC) 05/24/2020   Hyperlipidemia    IBS (irritable bowel syndrome) 02/16/2022   Insomnia    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)    Lupus    Migraine headache    Morbid obesity (HCC) 05/24/2020   Neuropathy    Nocturnal seizures (HCC)    Pain of upper abdomen 07/28/2021   Peptic ulcer    Spleen enlarged    Splenomegaly    Past Surgical History:  Procedure Laterality Date   BIOPSY  11/25/2021   Procedure: BIOPSY;  Surgeon: Eartha Angelia Sieving, MD;  [facility];  Service: Gastroenterology;;   CHOLECYSTECTOMY     COLONOSCOPY WITH PROPOFOL  N/A 11/25/2021   Procedure: COLONOSCOPY WITH PROPOFOL ;  Surgeon: Eartha Angelia Sieving, MD;  Location: [facility];  Service: Gastroenterology;  Laterality: N/A;  805   COLONOSCOPY WITH PROPOFOL  N/A 03/31/2022   Procedure: COLONOSCOPY WITH PROPOFOL ;  Surgeon: Eartha Angelia Sieving, MD;  Location: [facility];  Service: Gastroenterology;  Laterality: N/A;  730   ESOPHAGOGASTRODUODENOSCOPY  06/2020   done at [facility], candida in upper  esophagus (treated with diflucan ), ulcerative esophagitis at GE junction, gastritis in stomach, single ulcer in duodenal bulb, with duodenal mucosa showing no abnormality. No presence of varices   ESOPHAGOGASTRODUODENOSCOPY (EGD) WITH PROPOFOL  N/A 11/25/2021   Procedure: ESOPHAGOGASTRODUODENOSCOPY (EGD) WITH PROPOFOL ;  Surgeon: Eartha Angelia Sieving, MD;  [facility];  Service: Gastroenterology;  Laterality: N/A;   ESOPHAGOGASTRODUODENOSCOPY (EGD) WITH PROPOFOL  N/A 03/31/2022   Procedure: ESOPHAGOGASTRODUODENOSCOPY (EGD) WITH PROPOFOL ;  Surgeon: Eartha Angelia Sieving, MD;  [facility];  Service: Gastroenterology;  Laterality: N/A;   IVC FILTER REMOVAL N/A 09/16/2023   Procedure: IVC FILTER REMOVAL;  Surgeon: Pearline Norman RAMAN, MD;  Location: [facility] INVASIVE CV LAB;  Service: Cardiovascular;  Laterality: N/A;   IVC FILTER REMOVAL N/A 02/14/2024   Procedure: IVC FILTER REMOVAL;  Surgeon: Pearline Norman RAMAN, MD;  Location: [facility] INVASIVE CV LAB;  Service: Cardiovascular;  Laterality: N/A;   PERIPHERAL VASCULAR THROMBECTOMY Left 09/16/2023   Procedure: PERIPHERAL VASCULAR THROMBECTOMY;  Surgeon: Pearline Norman RAMAN, MD;  Location: [facility] INVASIVE CV LAB;  Service: Cardiovascular;  Laterality: Left;   Patient Active Problem List   Diagnosis Date Noted   At risk for sleep apnea 05/17/2024   Left leg pain 05/15/2024   Encounter for examination following treatment at hospital 05/15/2024   Hx of bipolar disorder 04/05/2024   Lumbar pain 12/09/2023   Cellulitis and abscess of buttock 10/22/2023   Type 2 diabetes mellitus with other specified  complication (HCC) 10/22/2023   Encounter for IUD insertion 10/06/2023   Hospital discharge follow-up 10/05/2023   Depression, major, single episode, severe (HCC) 10/05/2023   Acute pulmonary embolism (HCC) 09/25/2023   Elevated d-dimer 09/16/2023   Elevated brain natriuretic peptide (BNP) level 09/16/2023   Hypoalbuminemia due to protein-calorie  malnutrition (HCC) 09/16/2023   Acute deep vein thrombosis (DVT) of left lower extremity (HCC) 09/15/2023   Left leg cellulitis 09/14/2023   Deep vein thrombophlebitis of left leg (HCC) 08/26/2023   Lumbar compression fracture (HCC) 02/10/2023   Left leg DVT (HCC) 02/05/2023   At risk for arrhythmia 01/26/2023   Chronic back pain 01/01/2023   Insomnia 01/01/2023   Depression, recurrent (HCC) 01/01/2023   Constipation 05/18/2022   IBS (irritable bowel syndrome) 02/16/2022   Chronic prescription opiate use 02/16/2022   Gastroesophageal reflux disease 10/23/2021   Nausea and vomiting 10/23/2021   Diarrhea 10/23/2021   Pain of upper abdomen 07/28/2021   Adjustment disorder with depressed mood 09/07/2020   Dyspnea 07/29/2020   Anasarca 05/24/2020   Hyperglycemia due to type 2 diabetes mellitus (HCC) 05/24/2020   Morbid obesity (HCC) 05/24/2020   Elevated serum immunoglobulin free light chains 03/08/2020   Fatty liver 03/07/2020   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   Splenomegaly 03/07/2020   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   Pressure injury of skin 01/29/2019   Uncontrolled diabetes mellitus 09/19/2018   Hyperglycemia 09/17/2018   Acute respiratory failure with hypoxia (HCC) 09/15/2018   Anxiety 09/15/2018   Chronic abdominal pain 09/15/2018   Diabetes mellitus without complication (HCC) 09/15/2018   Hepatitis C 09/15/2018   Peptic ulcer 09/15/2018   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) 09/15/2018   Cirrhosis (HCC) 09/15/2018   Tobacco use disorder 05/22/2018   CAP (community acquired pneumonia) 07/17/2016   Chronic pain disorder 07/17/2016   Depression 05/24/2013    PCP: Terry Wilhelmena Lloyd Hilario, FNP  REFERRING PROVIDER: Terry Wilhelmena Lloyd Hilario, FNP  REFERRING DIAG: [phone] (ICD-10-CM) - Degeneration of intervertebral disc of lumbar region with discogenic back pain and lower extremity pain  Rationale for Evaluation and Treatment:  Rehabilitation  THERAPY DIAG:  No diagnosis found.  ONSET DATE: ***  SUBJECTIVE:                                                                                                                                                                                           SUBJECTIVE STATEMENT: ***  PERTINENT HISTORY:  ***  PAIN:  Are you having pain? {OPRCPAIN:27236}  PRECAUTIONS: None  RED FLAGS: {PT Red Flags:29287}   WEIGHT BEARING RESTRICTIONS: No  FALLS:  Has  patient fallen in last 6 months? {fallsyesno:27318}  LIVING ENVIRONMENT: Lives with: {OPRC lives with:25569::lives with their family} Lives in: {Lives in:25570} Stairs: {opstairs:27293} Has following equipment at home: {Assistive devices:23999}  OCCUPATION: ***  PLOF: {PLOF:24004}  PATIENT GOALS: ***  NEXT MD VISIT: ***  OBJECTIVE:  Note: Objective measures were completed at Evaluation unless otherwise noted.  DIAGNOSTIC FINDINGS:  IMPRESSION: Mild degenerative changes.    PATIENT SURVEYS:  Modified Oswestry:  MODIFIED OSWESTRY DISABILITY SCALE  Date: *** Score  Pain intensity {ODI 1:32962}  2. Personal care (washing, dressing, etc.) {ODI 2:32963}  3. Lifting {ODI 3:32964}  4. Walking {ODI 4:32965}  5. Sitting {ODI 5:32966}  6. Standing {ODI 6:32967}  7. Sleeping {ODI 7:32968}  8. Social Life {ODI 8:32969}  9. Traveling {ODI 9:32970}  10. Employment/ Homemaking {ODI 10:32971}  Total ***/50   Interpretation of scores: Score Category Description  0-20% Minimal Disability The patient can cope with most living activities. Usually no treatment is indicated apart from advice on lifting, sitting and exercise  21-40% Moderate Disability The patient experiences more pain and difficulty with sitting, lifting and standing. Travel and social life are more difficult and they may be disabled from work. Personal care, sexual activity and sleeping are not grossly affected, and the patient can usually be  managed by conservative means  41-60% Severe Disability Pain remains the main problem in this group, but activities of daily living are affected. These patients require a detailed investigation  61-80% Crippled Back pain impinges on all aspects of the patient's life. Positive intervention is required  81-100% Bed-bound  These patients are either bed-bound or exaggerating their symptoms  Bluford FORBES Zoe DELENA Karon DELENA, et al. Surgery versus conservative management of stable thoracolumbar fracture: the PRESTO feasibility RCT. [place] ([place]): [employer]; 2021 Nov. [facility] Technology Assessment, No. 25.62.) Appendix 3, Oswestry Disability Index category descriptors. Available from: [employer]  Minimally Clinically Important Difference (MCID) = 12.8% LEFS  Extreme difficulty/unable (0), Quite a bit of difficulty (1), Moderate difficulty (2), Little difficulty (3), No difficulty (4) Survey date:    Any of your usual work, housework or school activities   2. Usual hobbies, recreational or sporting activities   3. Getting into/out of the bath   4. Walking between rooms   5. Putting on socks/shoes   6. Squatting    7. Lifting an object, like a bag of groceries from the floor   8. Performing light activities around your home   9. Performing heavy activities around your home   10. Getting into/out of a car   11. Walking 2 blocks   12. Walking 1 mile   13. Going up/down 10 stairs (1 flight)   14. Standing for 1 hour   15.  sitting for 1 hour   16. Running on even ground   17. Running on uneven ground   18. Making sharp turns while running fast   19. Hopping    20. Rolling over in bed   Score total:  ***     COGNITION: Overall cognitive status: {cognition:24006}     SENSATION: {sensation:27233}  MUSCLE LENGTH: Hamstrings: Right *** deg; Left *** deg Debby test: Right *** deg; Left *** deg  POSTURE:  {posture:25561}  PALPATION: ***  LUMBAR ROM:   AROM eval  Flexion   Extension   Right lateral flexion   Left lateral flexion   Right rotation   Left rotation    (Blank rows = not tested)  LOWER EXTREMITY ROM:     {  AROM/PROM:27142}  Right eval Left eval  Hip flexion    Hip extension    Hip abduction    Hip adduction    Hip internal rotation    Hip external rotation    Knee flexion    Knee extension    Ankle dorsiflexion    Ankle plantarflexion    Ankle inversion    Ankle eversion     (Blank rows = not tested)  LOWER EXTREMITY MMT:    MMT Right eval Left eval  Hip flexion    Hip extension    Hip abduction    Hip adduction    Hip internal rotation    Hip external rotation    Knee flexion    Knee extension    Ankle dorsiflexion    Ankle plantarflexion    Ankle inversion    Ankle eversion     (Blank rows = not tested)  LUMBAR SPECIAL TESTS:  {lumbar special test:25242}  FUNCTIONAL TESTS:  {Functional tests:24029}  GAIT: Distance walked: *** Assistive device utilized: {Assistive devices:23999} Level of assistance: {Levels of assistance:24026} Comments: ***  TREATMENT DATE:  06/12/24; PT eval and HEP                                                                                                                                  PATIENT EDUCATION:  Education details: PT evaluation, objective findings, POC, Importance of HEP, Precautions, Clinic policies  Person educated: Patient Education method: Explanation and Demonstration Education comprehension: verbalized understanding and returned demonstration  HOME EXERCISE PROGRAM: ***  ASSESSMENT:  CLINICAL IMPRESSION: Patient is a 38 y.o. female who was seen today for physical therapy evaluation and treatment for M51.362 (ICD-10-CM) - Degeneration of intervertebral disc of lumbar region with discogenic back pain and lower extremity pain.   OBJECTIVE IMPAIRMENTS: {opptimpairments:25111}.    ACTIVITY LIMITATIONS: {activitylimitations:27494}  PARTICIPATION LIMITATIONS: {participationrestrictions:25113}  PERSONAL FACTORS: {Personal factors:25162} are also affecting patient's functional outcome.   REHAB POTENTIAL: {rehabpotential:25112}  CLINICAL DECISION MAKING: {clinical decision making:25114}  EVALUATION COMPLEXITY: {Evaluation complexity:25115}   GOALS: Goals reviewed with patient? No  SHORT TERM GOALS: Target date: *** Patient will be independent with performance of HEP to demonstrate adequate self management of symptoms.  Baseline:  Goal status: INITIAL  2.   Patient will report at least a 25% improvement with function or pain overall since beginning PT. Baseline:  Goal status: INITIAL   LONG TERM GOALS: Target date: ***  *** Baseline:  Goal status: INITIAL  2.  *** Baseline:  Goal status: INITIAL  3.  *** Baseline:  Goal status: INITIAL  4.  *** Baseline:  Goal status: INITIAL  5.  *** Baseline:  Goal status: INITIAL  6.  *** Baseline:  Goal status: INITIAL  PLAN:  PT FREQUENCY: 2x/week  PT DURATION: 6 weeks  PLANNED INTERVENTIONS: 97164- PT Re-evaluation, 97110-Therapeutic exercises, 97530- Therapeutic activity, [id number]- Neuromuscular re-education, 97535- Self Care, 02859- Manual therapy, [id number]- Gait  training, 02967- Electrical stimulation (manual), [id number]- Traction (mechanical), [id number] (1-2 muscles), 20561 (3+ muscles)- Dry Needling, Patient/Family education, Balance training, Stair training, Taping, Joint mobilization, Spinal mobilization, Cryotherapy, and Moist heat.  PLAN FOR NEXT SESSION: ***   3:26 PM, 06/09/24 Rosaria Settler, PT, DPT [facility] [place]

## 2024-06-12 ENCOUNTER — Ambulatory Visit (HOSPITAL_COMMUNITY): Payer: MEDICAID

## 2024-06-12 ENCOUNTER — Telehealth (HOSPITAL_COMMUNITY): Payer: Self-pay

## 2024-06-12 NOTE — Telephone Encounter (Signed)
 Called patient concerning missed evaluation this date. Patient reporting she forgot about appointment but would like to reschedule. Call transferred to front desk staff to reschedule.    4:13 PM, 06/12/24 Rosaria Settler, PT, DPT [facility] [place]

## 2024-06-15 ENCOUNTER — Ambulatory Visit: Payer: MEDICAID | Admitting: Family Medicine

## 2024-06-22 ENCOUNTER — Ambulatory Visit (INDEPENDENT_AMBULATORY_CARE_PROVIDER_SITE_OTHER): Payer: MEDICAID | Admitting: Pulmonary Disease

## 2024-06-22 ENCOUNTER — Encounter (HOSPITAL_BASED_OUTPATIENT_CLINIC_OR_DEPARTMENT_OTHER): Payer: Self-pay | Admitting: Pulmonary Disease

## 2024-06-22 ENCOUNTER — Other Ambulatory Visit: Payer: Self-pay

## 2024-06-22 VITALS — BP 122/78 | HR 113 | Ht 70.0 in | Wt 223.0 lb

## 2024-06-22 DIAGNOSIS — G4700 Insomnia, unspecified: Secondary | ICD-10-CM | POA: Diagnosis not present

## 2024-06-22 MED ORDER — TRAZODONE HCL 150 MG PO TABS: 150.0000 mg | ORAL_TABLET | Freq: Every evening | ORAL | 0 refills | Status: DC | PRN

## 2024-06-22 NOTE — Patient Instructions (Signed)
  VISIT SUMMARY: You came in today for an evaluation of your sleep issues, specifically chronic insomnia. You were accompanied by your husband, Ezzie. We discussed your difficulty maintaining sleep, waking up around 2 to 3 AM, and feeling unrefreshed in the morning. We also reviewed your medical history, including diabetes, past blood clots, cirrhosis, and your current medications.  YOUR PLAN: -CHRONIC INSOMNIA: Chronic insomnia means having trouble sleeping for a long period of time. You have difficulty staying asleep and often wake up around 2 to 3 AM. We will start you on trazodone  200 mg, with the option to increase to 300 mg if needed. Additionally, we recommend seeing a mental health professional to help manage your anxiety and depression, which may also improve your sleep.  -ANXIETY AND DEPRESSION: Anxiety and depression can affect your sleep and overall well-being. You are currently taking clonazepam  and methadone . We recommend seeing a mental health professional for a tailored treatment plan.  INSTRUCTIONS: Please follow up with a mental health professional for evaluation and management of your anxiety and depression. You may also increase your trazodone  dose to 300 mg if the 200 mg dose is not effective.                      Contains text generated by [employer].                                 Contains text generated by [employer].

## 2024-06-22 NOTE — Progress Notes (Signed)
 New Patient Pulmonology Office Visit   Subjective:  Patient ID: Jaime [name], female    DOB: [birth year]  MRN: [id number]  Referred by: Tobie Suzzane POUR, MD  CC:  Chief Complaint  Patient presents with   Establish Care    Sleep consult, no sleep study no CPAP    Discussed the use of AI scribe software for clinical note transcription with the patient, who gave verbal consent to proceed.  History of Present Illness  Discussed the use of AI scribe software for clinical note transcription with the patient, who gave verbal consent to proceed.  History of Present Illness   Jaime [name] is a 38 year old female who presents for a sleep apnea evaluation. She is accompanied by her husband, Ezzie. She was referred by her primary care doctor, Daleen, for evaluation of her sleep issues.  She experiences chronic insomnia with difficulty maintaining sleep, waking around 2 to 3 AM and unable to return to sleep. Her sleep is not restful, and she wakes feeling unrefreshed. She does not snore, has no observed breathing interruptions, and does not exhibit restless legs during sleep. Her sleep schedule involves going to bed between 9 and 10 PM, waking at 2 AM, and getting out of bed by 7 AM. She works in the afternoons and starts her day earlier on weekends.  She has tried melatonin, trazodone , and Benadryl  without success. Clonazepam  is used as needed. A higher dose of trazodone  was effective during a rehabilitation stay.  Her medical history includes diabetes managed with insulin  and metformin , and she is on Mounjaro  for weight loss and diabetes management. She is tapering methadone  from 30 mg and plans to discontinue it. 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 She takes propranolol  and has used prednisone . Anxiety and depression are managed with Vistaril  as needed. She reports headaches, including a severe one requiring hospital attention.           PMH : cirrhosis DM-2 Chronic opiate use Ex-IVDU  ROS  Allergies: Firvanq  [vancomycin ], Iodine -131, Ivp dye [iodinated contrast media], Toradol [ketorolac tromethamine], Tylenol  [acetaminophen ], Nsaids, Suboxone  [buprenorphine hcl-naloxone  hcl], and Neurontin  [gabapentin ]  Current Outpatient Medications:    acetaminophen  (TYLENOL ) 500 MG tablet, Take 1,000 mg by mouth 2 (two) times daily as needed for moderate pain (pain score 4-6) or headache., Disp: , Rfl:    albuterol  (VENTOLIN  HFA) 108 (90 Base) MCG/ACT inhaler, Inhale 2 puffs into the lungs every 6 (six) hours as needed for wheezing or shortness of breath., Disp: 8 g, Rfl: 0   apixaban  (ELIQUIS ) 5 MG TABS tablet, Take 2 tablets (10 mg total) by mouth 2 (two) times daily for 6 days, THEN 1 tablet (5 mg total) 2 (two) times daily. (Patient taking differently: 1 tablet (5 mg total) 2 (two) times daily.), Disp: 60 tablet, Rfl: 4   ARIPiprazole  (ABILIFY ) 20 MG tablet, Take 1 tablet (20 mg total) by mouth daily., Disp: 30 tablet, Rfl: 3   chlorhexidine  (HIBICLENS ) 4 % external liquid, Apply topically daily as needed., Disp: 236 mL, Rfl: 0   clonazePAM  (KLONOPIN ) 0.5 MG tablet, Take 0.5 mg by mouth 2 (two) times daily as needed for anxiety., Disp: , Rfl:    Continuous Glucose Receiver (FREESTYLE LIBRE 3 READER) DEVI, Use the Freestyle Libre 3 Reader Device to monitor blood sugars as directed by the provider., Disp: 1 each, Rfl: 0   Continuous Glucose Sensor (DEXCOM G7 SENSOR) MISC, Inject 1  Application into the skin as directed. Change sensor every 10 days as directed., Disp: 9 each, Rfl: 3   Continuous Glucose Sensor (FREESTYLE LIBRE 3 PLUS SENSOR) MISC, 2 each by Does not apply route as directed. Change sensor every 15 days., Disp: 6 each, Rfl: 0   Continuous Glucose Transmitter (DEXCOM G6 TRANSMITTER) MISC, USE AS DIRECTED., Disp: 1 each, Rfl: 1   doxycycline  (VIBRA -TABS) 100 MG tablet, Take 1 tablet (100 mg total) by mouth 2 (two)  times daily., Disp: 14 tablet, Rfl: 0   famotidine  (PEPCID ) 20 MG tablet, Take 1 tablet (20 mg total) by mouth at bedtime., Disp: , Rfl:    ferrous sulfate  325 (65 FE) MG EC tablet, Take 1 tablet (325 mg total) by mouth daily with breakfast., Disp: 90 tablet, Rfl: 3   glucose blood (ACCU-CHEK GUIDE) test strip, Use as instructed to check blood glucose four times daily, Disp: 150 each, Rfl: 5   hydrOXYzine  (ATARAX ) 10 MG tablet, Take 1 tablet (10 mg total) by mouth 3 (three) times daily as needed., Disp: 30 tablet, Rfl: 0   hydrOXYzine  (VISTARIL ) 50 MG capsule, Take 1 capsule (50 mg total) by mouth 3 (three) times daily as needed., Disp: 30 capsule, Rfl: 2   hyoscyamine  (ANASPAZ ) 0.125 MG TBDP disintergrating tablet, Place 1 tablet (0.125 mg total) under the tongue every 6 (six) hours as needed., Disp: 180 tablet, Rfl: 1   Insulin  Pen Needle (PEN NEEDLES) 32G X 6 MM MISC, 1 each by Does not apply route 3 (three) times daily., Disp: 100 each, Rfl: 3   insulin  regular human CONCENTRATED (HUMULIN  R U-500 KWIKPEN) 500 UNIT/ML KwikPen, Inject 80 Units into the skin 3 (three) times daily with meals., Disp: 45 mL, Rfl: 3   lidocaine  (LIDODERM ) 5 %, Place 1 patch onto the skin daily. Remove & Discard patch within 12 hours or as directed by MD. DO not APPLY TO INFLAMED , RED SKIN, Disp: 30 patch, Rfl: 0   lidocaine  (XYLOCAINE ) 5 % ointment, Apply 1 Application topically as needed. (Patient taking differently: Apply 1 Application topically 2 (two) times daily as needed (pain).), Disp: 35.44 g, Rfl: 5   lisinopril  (ZESTRIL ) 5 MG tablet, Take 5 mg by mouth daily., Disp: , Rfl:    metFORMIN  (GLUCOPHAGE ) 1000 MG tablet, Take 1 tablet (1,000 mg total) by mouth 2 (two) times daily with a meal. take 1 tablet by mouth 2 times a day with a meal., Disp: 180 tablet, Rfl: 3   methadone  (DOLOPHINE ) 10 MG/ML solution, Take 105 mg by mouth daily., Disp: , Rfl:    methocarbamol (ROBAXIN) 750 MG tablet, Take 750 mg by mouth every  6 (six) hours as needed for muscle spasms., Disp: , Rfl:    mupirocin  ointment (BACTROBAN ) 2 %, Apply 1 Application topically 2 (two) times daily., Disp: 60 g, Rfl: 0   NARCAN  4 MG/0.1ML LIQD nasal spray kit, Place 1 spray into the nose once as needed (OD)., Disp: , Rfl:    nystatin  cream (MYCOSTATIN ), Apply 1 Application topically 2 (two) times daily., Disp: 30 g, Rfl: 0   Omega-3 Fatty Acids (ENTERIC FISH OIL) 1000 MG CPDR, Take 1,000 mg by mouth 3 (three) times daily., Disp: , Rfl:    omeprazole  (PRILOSEC) 40 MG capsule, TAKE 1 CAPSULE BY MOUTH TWICE DAILY., Disp: 120 capsule, Rfl: 5   ondansetron  (ZOFRAN ) 4 MG tablet, Take 1 tablet (4 mg total) by mouth every 8 (eight) hours as needed for nausea or vomiting., Disp: 20 tablet,  Rfl: 1   Oxycodone  HCl 10 MG TABS, Take 1 tablet (10 mg total) by mouth every 6 (six) hours as needed (pain)., Disp: 30 tablet, Rfl: 0   pantoprazole  (PROTONIX ) 20 MG tablet, Take 20 mg by mouth daily., Disp: , Rfl:    potassium chloride  (KLOR-CON ) 10 MEQ tablet, Take 10 mEq by mouth 2 (two) times daily., Disp: , Rfl:    propranolol  (INDERAL ) 10 MG tablet, Take 1 tablet (10 mg total) by mouth 2 (two) times daily., Disp: , Rfl:    silver  sulfADIAZINE  (SILVADENE ) 1 % cream, Apply 1 Application topically 2 (two) times daily., Disp: 50 g, Rfl: 1   tirzepatide  (MOUNJARO ) 7.5 MG/0.5ML Pen, Inject 7.5 mg into the skin once a week., Disp: 6 mL, Rfl: 1   torsemide  (DEMADEX ) 20 MG tablet, Take 2 tablets (40 mg total) by mouth daily., Disp: , Rfl:    traZODone  (DESYREL ) 150 MG tablet, Take 1-2 tablets (150-300 mg total) by mouth at bedtime as needed for sleep., Disp: 60 tablet, Rfl: 0   triamcinolone  cream (KENALOG ) 0.1 %, Apply 1 Application topically 2 (two) times daily., Disp: 453.6 g, Rfl: 0   venlafaxine  XR (EFFEXOR  XR) 150 MG 24 hr capsule, Take 1 capsule (150 mg total) by mouth daily with breakfast., Disp: 30 capsule, Rfl: 3  Current Facility-Administered Medications:     etonogestrel  (NEXPLANON ) implant 68 mg, 68 mg, Subdermal, Once, Ervin, Michael L, MD Past Medical History:  Diagnosis Date   Acute encephalopathy 08/31/2020   Adjustment disorder with depressed mood 09/07/2020   Anxiety    Asthma    Chronic abdominal pain    Chronic back pain    Chronic prescription opiate use 02/16/2022   Cirrhosis (HCC)    Cirrhosis (HCC)    COPD (chronic obstructive pulmonary disease) (HCC)    Depression    Diabetes mellitus without complication (HCC)    Diabetes mellitus, type II (HCC)    Diverticulosis    DVT (deep venous thrombosis) (HCC) 02/04/2023   taking lovenox  prior to procedure   Fatty liver 03/07/2020   GERD (gastroesophageal reflux disease)    Hepatitis C    Hernia, abdominal    Hyperglycemia due to type 2 diabetes mellitus (HCC) 05/24/2020   Hyperlipidemia    IBS (irritable bowel syndrome) 02/16/2022   Insomnia    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)    Lupus    Migraine headache    Morbid obesity (HCC) 05/24/2020   Neuropathy    Nocturnal seizures (HCC)    Pain of upper abdomen 07/28/2021   Peptic ulcer    Spleen enlarged    Splenomegaly    Past Surgical History:  Procedure Laterality Date   BIOPSY  11/25/2021   Procedure: BIOPSY;  Surgeon: Eartha Angelia Sieving, MD;  [facility]: [facility];  Service: Gastroenterology;;   CHOLECYSTECTOMY     COLONOSCOPY WITH PROPOFOL  N/A 11/25/2021   Procedure: COLONOSCOPY WITH PROPOFOL ;  Surgeon: Eartha Angelia Sieving, MD;  Location: [facility];  Service: Gastroenterology;  Laterality: N/A;  805   COLONOSCOPY WITH PROPOFOL  N/A 03/31/2022   Procedure: COLONOSCOPY WITH PROPOFOL ;  Surgeon: Eartha Angelia Sieving, MD;  Location: [facility];  Service: Gastroenterology;  Laterality: N/A;  730   ESOPHAGOGASTRODUODENOSCOPY  06/2020   done at [facility], candida in upper esophagus (treated with diflucan ), ulcerative esophagitis at GE junction, gastritis in stomach, single  ulcer in duodenal bulb, with duodenal mucosa showing no abnormality. No presence of varices  ESOPHAGOGASTRODUODENOSCOPY (EGD) WITH PROPOFOL  N/A 11/25/2021   Procedure: ESOPHAGOGASTRODUODENOSCOPY (EGD) WITH PROPOFOL ;  Surgeon: Eartha Angelia Sieving, MD;  Location: [facility];  Service: Gastroenterology;  Laterality: N/A;   ESOPHAGOGASTRODUODENOSCOPY (EGD) WITH PROPOFOL  N/A 03/31/2022   Procedure: ESOPHAGOGASTRODUODENOSCOPY (EGD) WITH PROPOFOL ;  Surgeon: Eartha Angelia Sieving, MD;  Location: [facility];  Service: Gastroenterology;  Laterality: N/A;   IVC FILTER REMOVAL N/A 09/16/2023   Procedure: IVC FILTER REMOVAL;  Surgeon: Pearline Norman RAMAN, MD;  Location: [facility] INVASIVE CV LAB;  Service: Cardiovascular;  Laterality: N/A;   IVC FILTER REMOVAL N/A 02/14/2024   Procedure: IVC FILTER REMOVAL;  Surgeon: Pearline Norman RAMAN, MD;  Location: [facility] IN[facility]AS[facility] CV LAB;  Service: Cardiovascular;  Laterality: N/A;   PERIPHERAL VASCULAR THROMBECTOMY Left 09/16/2023   Procedure: PERIPHERAL VASCULAR THROMBECTOMY;  Surgeon: Pearline Norman RAMAN, MD;  Location: [facility];  Service: Cardiovascular;  Laterality: Left;   Family History  Problem Relation Age of Onset   Hyperlipidemia Maternal Grandfather    Social History   Socioeconomic History   Marital status: Married    Spouse name: Not on file   Number of children: Not on file   Years of education: Not on file   Highest education level: Some college, no degree  Occupational History   Not on file  Tobacco Use   Smoking status: Every Day    Current packs/day: 0.50    Types: Cigarettes    Passive exposure: Current   Smokeless tobacco: Never  Vaping Use   Vaping status: Every Day   Substances: Nicotine , Flavoring  Substance and Sexual Activity   Alcohol use: No   Drug use: No   Sexual activity: Yes    Birth control/protection: Implant  Other Topics Concern   Not on file  Social History Narrative   Not on file   Social Drivers of Health    Financial Resource Strain: High Risk (12/09/2023)   Overall Financial Resource Strain (CARDIA)    Difficulty of Paying Living Expenses: Very hard  Food Insecurity: Food Insecurity Present (12/09/2023)   Hunger Vital Sign    Worried About Running Out of Food in the Last Year: Often true    Ran Out of Food in the Last Year: Often true  Transportation Needs: No Transportation Needs (12/09/2023)   PRAPARE - Administrator, Civil Service (Medical): No    Lack of Transportation (Non-Medical): No  Physical Activity: Insufficiently Active (12/09/2023)   Exercise Vital Sign    Days of Exercise per Week: 1 day    Minutes of Exercise per Session: 10 min  Stress: Stress Concern Present (05/09/2020)   [employer] of Occupational Health - Occupational Stress Questionnaire    Feeling of Stress : Very much  Social Connections: Moderately Integrated (12/09/2023)   Social Connection and Isolation Panel    Frequency of Communication with Friends and Family: More than three times a week    Frequency of Social Gatherings with Friends and Family: More than three times a week    Attends Religious Services: 1 to 4 times per year    Active Member of [employer] or Organizations: Yes    Attends Banker Meetings: 1 to 4 times per year    Marital Status: Separated  Intimate Partner Violence: Not At Risk (09/25/2023)   Humiliation, Afraid, Rape, and Kick questionnaire    Fear of Current or Ex-Partner: No    Emotionally Abused: No    Physically Abused: No    Sexually  Abused: No       Objective:  BP 122/78   Pulse (!) 113   Ht 5' 10 (1.778 m)   Wt 223 lb (101.2 kg)   LMP 05/22/2024 (Within Days)   SpO2 98%   BMI 32.00 kg/m    Physical Exam  Gen. Pleasant, obese, in no distress, normal affect ENT - no pallor,icterus, no post nasal drip, class 2 airway Neck: No JVD, no thyromegaly, no carotid bruits Lungs: no use of accessory muscles, no dullness to percussion, decreased without  rales or rhonchi  Cardiovascular: Rhythm regular, heart sounds  normal, no murmurs or gallops, no peripheral edema Abdomen: soft and non-tender, no hepatosplenomegaly, BS normal. Musculoskeletal: No deformities, no cyanosis or clubbing Neuro:  alert, non focal, no tremors Skin - multiple piercings & tattoos       Assessment & Plan:  Assessment and Plan Assessment & Plan   Assessment and Plan    Chronic insomnia Chronic insomnia characterized by difficulty maintaining sleep, with frequent awakenings around 2-3 AM and inability to return to sleep. Long-standing issue, possibly years in duration. No evidence of sleep apnea or restless legs syndrome based on history and partner's observations. Previous trials of melatonin and trazodone  (up to 150 mg) were ineffective. 300 mg was effective. Current medications include methadone  and clonazepam , which are sedating. Concerns about habit-forming medications. Trazodone  at 300 mg was effective in the past during rehabilitation. - Prescribe trazodone  200 mg with option to increase to 300 mg if needed. - Refer to mental health professional for evaluation and management of anxiety and depression, and to consider medications that can also aid sleep.  Anxiety and depression Anxiety and depression present, contributing to sleep disturbances. Current medications include clonazepam  and methadone . Previous use of Xanax was effective but is not recommended due to habit-forming potential. Discussion of alternative medications like Cymbalta  and Paxil, which can address both anxiety and depression, as well as aid sleep. Mental health evaluation recommended for tailored treatment. - Refer to mental health professional for evaluation and management of anxiety and depression.           Return for with PCP.   Harden ROCKFORD Jude, MD

## 2024-06-22 NOTE — Progress Notes (Signed)
 Epworth Sleepiness Scale  Use the following scale to choose the most appropriate number for each situation. 0 Would never nod off 1  Slight  chance of nodding off 2 Moderate chance of nodding off 3 High chance of nodding off  Sitting and reading: 1 Watching TV: 3 Sitting, inactive, in a public place (e.g., in a meeting, theater, or dinner event): 0 As a passenger in a car for an hour or more without stopping for a break: 0 Lying down to rest when circumstances permit:0 Sitting and talking to someone: 0 Sitting quietly after a meal without alcohol: 0 In a car, while stopped for a few  minutes in traffic or at a light: 0  TOTOAL: 4

## 2024-06-27 ENCOUNTER — Ambulatory Visit: Payer: Self-pay

## 2024-06-27 ENCOUNTER — Other Ambulatory Visit: Payer: Self-pay | Admitting: Nurse Practitioner

## 2024-06-27 ENCOUNTER — Inpatient Hospital Stay: Payer: Self-pay | Admitting: Nurse Practitioner

## 2024-06-27 DIAGNOSIS — Z7984 Long term (current) use of oral hypoglycemic drugs: Secondary | ICD-10-CM

## 2024-06-27 DIAGNOSIS — Z7985 Long-term (current) use of injectable non-insulin antidiabetic drugs: Secondary | ICD-10-CM

## 2024-06-27 DIAGNOSIS — Z794 Long term (current) use of insulin: Secondary | ICD-10-CM

## 2024-06-27 DIAGNOSIS — E1165 Type 2 diabetes mellitus with hyperglycemia: Secondary | ICD-10-CM

## 2024-06-27 NOTE — Telephone Encounter (Signed)
 FYI Only or Action Required?: FYI only for provider.  Patient was last seen in primary care on 05/31/2024 by Bevely Doffing, FNP.  Called Nurse Triage reporting Leg Swelling.  Symptoms began several days ago.  Interventions attempted: Nothing.  Symptoms are: stable.  Triage Disposition: Go to ED Now (Notify PCP)  Patient/caregiver understands and will follow disposition?: Yes  Copied from CRM [id number]. Topic: Clinical - Red Word Triage >> Jun 27, 2024  5:03 PM Tinnie BROCKS wrote: Red Word that prompted transfer to Nurse Triage: Pt calling back in to schedule. She did not end up going to the ER. See CRM copied below: Previous triage from earlier today Patient was scheduled for a hospital follow up today, provider canceled. Says she has new/worsening symptoms, Still has headache and sleeping issues and a blot clot in her left leg that has pain, itching, redness and swelling. Reason for Disposition  [1] Can't walk or can barely walk AND [2] new-onset  Answer Assessment - Initial Assessment Questions Pt triaged earlier this morning and advised to go to ED for symptoms, did not end up going to wanted to schedule appointment with PCP. Advised pt to go to ED for symptoms. Denied new symptoms and stated she feels the same as earlier. Stated she had a blood clot in her L leg in April and that leg is now swollen, red, itchy and painful.  1. ONSET: When did the swelling start? (e.g., minutes, hours, days)     Few days ago 2. LOCATION: What part of the leg is swollen?  Are both legs swollen or just one leg?     Left leg 3. SEVERITY: How bad is the swelling? (e.g., localized; mild, moderate, severe)     Moderate 4. REDNESS: Is there redness or signs of infection?     Redness  5. OTHER SYMPTOMS: Do you have any other symptoms? (e.g., chest pain, difficulty breathing)       Fatigue, trouble sleeping.  Protocols used: Leg Swelling and Edema-A-AH

## 2024-06-27 NOTE — Telephone Encounter (Signed)
 FYI Only or Action Required?: FYI only for provider.  Patient was last seen in primary care on 05/31/2024 by Bevely Doffing, FNP.  Called Nurse Triage reporting Leg Pain, Leg Swelling, and Insomnia.  Symptoms began several weeks ago.  Interventions attempted: Other: went to ED on 06/18/24.  Symptoms are: left leg swelling (foot to knee), redness and warmth to left lower leg with pain, mild SOB at rest, insomnia unchanged.  Triage Disposition: Go to ED Now (Notify PCP)- Advised patient to call back after she goes to ED to reschedule her HFU  Patient/caregiver understands and will follow disposition?: Yes                 Copied from CRM #[id number]. Topic: Clinical - Red Word Triage >> Jun 27, 2024  8:05 AM Willma SAUNDERS wrote: [facility] that prompted transfer to Nurse Triage: Patient was scheduled for a hospital follow up today, provider canceled. Says she has new/worsening symptoms, Still has headache and sleeping issues and a blot clot in her left leg that has pain, itching, redness and swelling. Reason for Disposition  Difficulty breathing at rest  Answer Assessment - Initial Assessment Questions 1. ONSET: When did the swelling start? (e.g., minutes, hours, days)     Few weeks ago. Constant. She was seen in the ED at [facility] on 06/18/24 and she states she told the ED about it but they did not assess it.  2. LOCATION: What part of the leg is swollen?  Are both legs swollen or just one leg?     Left leg, no swelling in the right leg. Foot to knee.  3. SEVERITY: How bad is the swelling? (e.g., localized; mild, moderate, severe)     Moderate.  4. REDNESS: Is there redness or signs of infection?     From left ankle up to mid of shin is red with warmth.  5. PAIN: Is the swelling painful to touch? If Yes, ask: How painful is it?   (Scale 1-10; mild, moderate or severe)     9/10.  6. FEVER: Do you have a fever? If Yes, ask: What is it, how was it measured, and  when did it start?      Unsure. She states she has been getting chills but has not checked her temperature.  7. CAUSE: What do you think is causing the leg swelling?     She is unsure but thinks it may be a blood clot.  8. MEDICAL HISTORY: Do you have a history of blood clots (e.g., DVT), cancer, heart failure, kidney disease, or liver failure?     DVT.  9. RECURRENT SYMPTOM: Have you had leg swelling before? If Yes, ask: When was the last time? What happened that time?     April 2025, she had a DVT.  10. OTHER SYMPTOMS: Do you have any other symptoms? (e.g., chest pain, difficulty breathing)       Denies chest pain. Patient states she feels mild SOB sometimes x few weeks. She states she feels some SOB. Patient also reports insomnia for a while, states she was seen by sleep study doctor last week and was told to follow up with PCP for refill on medication he prescribed.  11. PREGNANCY: Is there any chance you are pregnant? When was your last menstrual period?       LMP: 06/24/24.  Protocols used: Leg Swelling and Edema-A-AH

## 2024-06-28 NOTE — Telephone Encounter (Signed)
 Noted patient advised ER

## 2024-07-01 ENCOUNTER — Emergency Department (HOSPITAL_COMMUNITY)
Admission: EM | Admit: 2024-07-01 | Discharge: 2024-07-01 | Disposition: A | Discharge status: Home / Self Care | Payer: MEDICAID | Attending: Emergency Medicine | Admitting: Emergency Medicine

## 2024-07-01 ENCOUNTER — Emergency Department (HOSPITAL_COMMUNITY): Payer: MEDICAID

## 2024-07-01 ENCOUNTER — Encounter (HOSPITAL_COMMUNITY): Payer: Self-pay

## 2024-07-01 ENCOUNTER — Other Ambulatory Visit: Payer: Self-pay

## 2024-07-01 DIAGNOSIS — Z794 Long term (current) use of insulin: Secondary | ICD-10-CM | POA: Insufficient documentation

## 2024-07-01 DIAGNOSIS — R739 Hyperglycemia, unspecified: Secondary | ICD-10-CM

## 2024-07-01 DIAGNOSIS — R110 Nausea: Secondary | ICD-10-CM | POA: Diagnosis not present

## 2024-07-01 DIAGNOSIS — Z7901 Long term (current) use of anticoagulants: Secondary | ICD-10-CM | POA: Insufficient documentation

## 2024-07-01 DIAGNOSIS — R197 Diarrhea, unspecified: Secondary | ICD-10-CM | POA: Diagnosis present

## 2024-07-01 DIAGNOSIS — J029 Acute pharyngitis, unspecified: Secondary | ICD-10-CM | POA: Insufficient documentation

## 2024-07-01 DIAGNOSIS — N3000 Acute cystitis without hematuria: Secondary | ICD-10-CM

## 2024-07-01 LAB — BLOOD GAS, VENOUS
Acid-Base Excess: 4 mmol/L — ABNORMAL HIGH (ref 0.0–2.0)
Bicarbonate: 29.7 mmol/L — ABNORMAL HIGH (ref 20.0–28.0)
Drawn by: 42942
O2 Saturation: 90.3 %
Patient temperature: 36.9
pCO2, Ven: 48 mmHg (ref 44–60)
pH, Ven: 7.4 (ref 7.25–7.43)
pO2, Ven: 56 mmHg — ABNORMAL HIGH (ref 32–45)

## 2024-07-01 LAB — CBC WITH DIFFERENTIAL/PLATELET
Abs Immature Granulocytes: 0.01 K/uL (ref 0.00–0.07)
Basophils Absolute: 0 K/uL (ref 0.0–0.1)
Basophils Relative: 0 %
Eosinophils Absolute: 0.3 K/uL (ref 0.0–0.5)
Eosinophils Relative: 6 %
HCT: 39.8 % (ref 36.0–46.0)
Hemoglobin: 13.1 g/dL (ref 12.0–15.0)
Immature Granulocytes: 0 %
Lymphocytes Relative: 19 %
Lymphs Abs: 0.9 K/uL (ref 0.7–4.0)
MCH: 29.6 pg (ref 26.0–34.0)
MCHC: 32.9 g/dL (ref 30.0–36.0)
MCV: 90 fL (ref 80.0–100.0)
Monocytes Absolute: 0.3 K/uL (ref 0.1–1.0)
Monocytes Relative: 8 %
Neutro Abs: 3 K/uL (ref 1.7–7.7)
Neutrophils Relative %: 67 %
Platelets: 103 K/uL — ABNORMAL LOW (ref 150–400)
RBC: 4.42 MIL/uL (ref 3.87–5.11)
RDW: 13.6 % (ref 11.5–15.5)
WBC: 4.6 K/uL (ref 4.0–10.5)
nRBC: 0 % (ref 0.0–0.2)

## 2024-07-01 LAB — URINALYSIS, ROUTINE W REFLEX MICROSCOPIC
Bilirubin Urine: NEGATIVE
Glucose, UA: >=500 mg/dL — AB
Hgb urine dipstick: NEGATIVE
Ketones, ur: NEGATIVE mg/dL
Nitrite: NEGATIVE
Protein, ur: 30 mg/dL — AB
Specific Gravity, Urine: 1.028 (ref 1.005–1.030)
pH: 5 (ref 5.0–8.0)

## 2024-07-01 LAB — COMPREHENSIVE METABOLIC PANEL WITH GFR
ALT: 23 U/L (ref 0–44)
AST: 23 U/L (ref 15–41)
Albumin: 3.1 g/dL — ABNORMAL LOW (ref 3.5–5.0)
Alkaline Phosphatase: 113 U/L (ref 38–126)
Anion gap: 9 (ref 5–15)
BUN: 11 mg/dL (ref 6–20)
CO2: 25 mmol/L (ref 22–32)
Calcium: 8.7 mg/dL — ABNORMAL LOW (ref 8.9–10.3)
Chloride: 103 mmol/L (ref 98–111)
Creatinine, Ser: 0.48 mg/dL (ref 0.44–1.00)
GFR, Estimated: >60 mL/min (ref >60)
Glucose, Bld: 281 mg/dL — ABNORMAL HIGH (ref 70–99)
Potassium: 4.1 mmol/L (ref 3.5–5.1)
Sodium: 137 mmol/L (ref 135–145)
Total Bilirubin: 0.9 mg/dL (ref 0.0–1.2)
Total Protein: 6.6 g/dL (ref 6.5–8.1)

## 2024-07-01 LAB — CBG MONITORING, ED
Glucose-Capillary: 319 mg/dL — ABNORMAL HIGH (ref 70–99)
Glucose-Capillary: 219 mg/dL — ABNORMAL HIGH (ref 70–99)

## 2024-07-01 LAB — BETA-HYDROXYBUTYRIC ACID: Beta-Hydroxybutyric Acid: 0.07 mmol/L (ref 0.05–0.27)

## 2024-07-01 LAB — LACTIC ACID, PLASMA: Lactic Acid, Venous: 1.6 mmol/L (ref 0.5–1.9)

## 2024-07-01 LAB — RESP PANEL BY RT-PCR (RSV, FLU A&B, COVID)  RVPGX2
Influenza A by PCR: NEGATIVE
Influenza B by PCR: NEGATIVE
Resp Syncytial Virus by PCR: NEGATIVE
SARS Coronavirus 2 by RT PCR: NEGATIVE

## 2024-07-01 LAB — PREGNANCY, URINE: Preg Test, Ur: NEGATIVE

## 2024-07-01 MED ORDER — SODIUM CHLORIDE 0.9 % IV SOLN
1.0000 g | Freq: Once | INTRAVENOUS | Status: AC
  Administered 2024-07-01: 1 g via INTRAVENOUS
  Filled 2024-07-01: qty 10

## 2024-07-01 MED ORDER — SODIUM CHLORIDE 0.9 % IV BOLUS
1000.0000 mL | Freq: Once | INTRAVENOUS | Status: AC
  Administered 2024-07-01: 1000 mL via INTRAVENOUS

## 2024-07-01 MED ORDER — MORPHINE SULFATE (PF) 4 MG/ML IV SOLN
2.0000 mg | Freq: Once | INTRAVENOUS | Status: AC
  Administered 2024-07-01: 2 mg via INTRAVENOUS
  Filled 2024-07-01: qty 1

## 2024-07-01 MED ORDER — CEPHALEXIN 500 MG PO CAPS: 500.0000 mg | ORAL_CAPSULE | Freq: Three times a day (TID) | ORAL | 0 refills | Status: AC

## 2024-07-01 MED ORDER — METHOCARBAMOL 500 MG PO TABS
750.0000 mg | ORAL_TABLET | Freq: Once | ORAL | Status: AC
  Administered 2024-07-01: 750 mg via ORAL
  Filled 2024-07-01: qty 2

## 2024-07-01 MED ORDER — METHOCARBAMOL 750 MG PO TABS: 750.0000 mg | ORAL_TABLET | Freq: Three times a day (TID) | ORAL | 0 refills | Status: DC

## 2024-07-01 MED ORDER — ONDANSETRON HCL 4 MG/2ML IJ SOLN
4.0000 mg | Freq: Once | INTRAMUSCULAR | Status: AC
  Administered 2024-07-01: 4 mg via INTRAVENOUS
  Filled 2024-07-01: qty 2

## 2024-07-01 MED ORDER — OXYCODONE HCL 5 MG PO TABS
5.0000 mg | ORAL_TABLET | Freq: Once | ORAL | Status: AC
  Administered 2024-07-01: 5 mg via ORAL
  Filled 2024-07-01: qty 1

## 2024-07-01 MED ORDER — MORPHINE SULFATE (PF) 4 MG/ML IV SOLN
4.0000 mg | Freq: Once | INTRAVENOUS | Status: AC
  Administered 2024-07-01: 4 mg via INTRAVENOUS
  Filled 2024-07-01: qty 1

## 2024-07-01 MED ORDER — INSULIN ASPART 100 UNIT/ML IJ SOLN
5.0000 [IU] | Freq: Once | INTRAMUSCULAR | Status: AC
  Administered 2024-07-01: 5 [IU] via SUBCUTANEOUS
  Filled 2024-07-01: qty 1

## 2024-07-01 NOTE — ED Notes (Signed)
 Patient requesting more morphine  prior to discharge EDP made aware.

## 2024-07-01 NOTE — ED Triage Notes (Signed)
 Pt c/o generalized body aches starting this morning. Pt states CBG 351 at home. Pt states did take her medication. Pt nausea  with 3 episodes of diarrhea. Pt denies being around anyone sick.

## 2024-07-01 NOTE — ED Provider Notes (Signed)
 [facility] AT [facility] Provider Note   CSN: [id number] Arrival date & time: 07/01/24  1158     Patient presents with: Generalized Body Aches   Jaime [name] is a 38 y.o. female.   Patient is a 38 year old female who presents emergency department the chief complaint of generalized bodyaches, nausea, diarrhea, sore throat which has been ongoing since this morning.  Patient denies any known sick contacts.  She notes that she has had no chest pain or shortness of breath.  She denies any cough or congestion.  She denies any abdominal pain or vomiting.  Patient does note that she has a history of DVT and notes that she has been compliant with her Eliquis .  She denies any associated dizziness, lightheadedness, syncope.  She denies any abnormal headache, pain to neck or back.  She has had no associated abnormal rashes.        Prior to Admission medications   Medication Sig Start Date End Date Taking? Authorizing Provider  acetaminophen  (TYLENOL ) 500 MG tablet Take 1,000 mg by mouth 2 (two) times daily as needed for moderate pain (pain score 4-6) or headache.    [provider]  albuterol  (VENTOLIN  HFA) 108 (90 Base) MCG/ACT inhaler Inhale 2 puffs into the lungs every 6 (six) hours as needed for wheezing or shortness of breath. 07/17/22   Leath-Warren, Etta PARAS, NP  apixaban  (ELIQUIS ) 5 MG TABS tablet Take 2 tablets (10 mg total) by mouth 2 (two) times daily for 6 days, THEN 1 tablet (5 mg total) 2 (two) times daily. Patient taking differently: 1 tablet (5 mg total) 2 (two) times daily. 09/28/23 06/22/24  Ricky Fines, MD  ARIPiprazole  (ABILIFY ) 20 MG tablet Take 1 tablet (20 mg total) by mouth daily. 05/31/24   Bevely Doffing, FNP  chlorhexidine  (HIBICLENS ) 4 % external liquid Apply topically daily as needed. 05/13/24   Stuart Vernell Norris, PA-C  clonazePAM  (KLONOPIN ) 0.5 MG tablet Take 0.5 mg by mouth 2 (two) times daily as needed for anxiety.  08/02/23   [provider]  Continuous Glucose Receiver (FREESTYLE LIBRE 3 READER) DEVI Use the Freestyle Libre 3 Reader Device to monitor blood sugars as directed by the provider. 02/29/24   Therisa Benton PARAS, NP  Continuous Glucose Sensor (DEXCOM G7 SENSOR) MISC Inject 1 Application into the skin as directed. Change sensor every 10 days as directed. 11/04/23   Therisa Benton PARAS, NP  Continuous Glucose Sensor (FREESTYLE LIBRE 3 PLUS SENSOR) MISC USE TO CHECK GLUCOSE CONTINUOUSLY AS DIRECTED CHANGING  SENSOR  EVERY  15  DAYS 06/27/24   Therisa Benton PARAS, NP  Continuous Glucose Transmitter (DEXCOM G6 TRANSMITTER) MISC USE AS DIRECTED. 03/24/23   Therisa Benton PARAS, NP  doxycycline  (VIBRA -TABS) 100 MG tablet Take 1 tablet (100 mg total) by mouth 2 (two) times daily. 05/17/24   Tobie Suzzane POUR, MD  famotidine  (PEPCID ) 20 MG tablet Take 1 tablet (20 mg total) by mouth at bedtime. 09/28/23   Ricky Fines, MD  ferrous sulfate  325 (65 FE) MG EC tablet Take 1 tablet (325 mg total) by mouth daily with breakfast. 09/13/23   Lamon Herter M, PA-C  glucose blood (ACCU-CHEK GUIDE) test strip Use as instructed to check blood glucose four times daily 12/01/21   Therisa Benton PARAS, NP  hydrOXYzine  (ATARAX ) 10 MG tablet Take 1 tablet (10 mg total) by mouth 3 (three) times daily as needed. 05/30/24   Leath-Warren, Etta PARAS, NP  hydrOXYzine  (VISTARIL ) 50 MG capsule  Take 1 capsule (50 mg total) by mouth 3 (three) times daily as needed. 05/25/23   Del Orbe Polanco, Iliana, FNP  hyoscyamine  (ANASPAZ ) 0.125 MG TBDP disintergrating tablet Place 1 tablet (0.125 mg total) under the tongue every 6 (six) hours as needed. 12/13/23   Castaneda Mayorga, Daniel, MD  Insulin  Pen Needle (PEN NEEDLES) 32G X 6 MM MISC 1 each by Does not apply route 3 (three) times daily. 02/26/21   Therisa Benton PARAS, NP  insulin  regular human CONCENTRATED (HUMULIN  R U-500 KWIKPEN) 500 UNIT/ML KwikPen Inject 80 Units into the skin 3 (three)  times daily with meals. 03/06/24   Therisa Benton PARAS, NP  lidocaine  (LIDODERM ) 5 % Place 1 patch onto the skin daily. Remove & Discard patch within 12 hours or as directed by MD. DO not APPLY TO INFLAMED , RED SKIN 04/12/24   Antonetta Rollene BRAVO, MD  lidocaine  (XYLOCAINE ) 5 % ointment Apply 1 Application topically as needed. Patient taking differently: Apply 1 Application topically 2 (two) times daily as needed (pain). 04/27/23   Del Wilhelmena Lloyd Sola, FNP  lisinopril  (ZESTRIL ) 5 MG tablet Take 5 mg by mouth daily.    [provider]  metFORMIN  (GLUCOPHAGE ) 1000 MG tablet Take 1 tablet (1,000 mg total) by mouth 2 (two) times daily with a meal. take 1 tablet by mouth 2 times a day with a meal. 11/04/23   Therisa Benton PARAS, NP  methadone  (DOLOPHINE ) 10 MG/ML solution Take 105 mg by mouth daily.    [provider]  methocarbamol  (ROBAXIN ) 750 MG tablet Take 750 mg by mouth every 6 (six) hours as needed for muscle spasms.    [provider]  mupirocin  ointment (BACTROBAN ) 2 % Apply 1 Application topically 2 (two) times daily. 05/13/24   Stuart Vernell Norris, PA-C  NARCAN  4 MG/0.1ML LIQD nasal spray kit Place 1 spray into the nose once as needed (OD). 04/07/24   [provider]  nystatin  cream (MYCOSTATIN ) Apply 1 Application topically 2 (two) times daily. 10/22/23   Antonetta Rollene BRAVO, MD  Omega-3 Fatty Acids (ENTERIC FISH OIL) 1000 MG CPDR Take 1,000 mg by mouth 3 (three) times daily.    [provider]  omeprazole  (PRILOSEC) 40 MG capsule TAKE 1 CAPSULE BY MOUTH TWICE DAILY. 05/10/23   Carlan, Chelsea L, NP  ondansetron  (ZOFRAN ) 4 MG tablet Take 1 tablet (4 mg total) by mouth every 8 (eight) hours as needed for nausea or vomiting. 03/08/23   Carlan, Chelsea L, NP  Oxycodone  HCl 10 MG TABS Take 1 tablet (10 mg total) by mouth every 6 (six) hours as needed (pain). 09/28/23   Ricky Fines, MD  pantoprazole  (PROTONIX ) 20 MG tablet Take 20 mg by mouth daily. 12/30/23    [provider]  potassium chloride  (KLOR-CON ) 10 MEQ tablet Take 10 mEq by mouth 2 (two) times daily. 08/10/23   [provider]  propranolol  (INDERAL ) 10 MG tablet Take 1 tablet (10 mg total) by mouth 2 (two) times daily. 09/28/23   Ricky Fines, MD  silver  sulfADIAZINE  (SILVADENE ) 1 % cream Apply 1 Application topically 2 (two) times daily. 05/31/24   Bevely Doffing, FNP  tirzepatide  (MOUNJARO ) 7.5 MG/0.5ML Pen Inject 7.5 mg into the skin once a week. 03/06/24   Therisa Benton PARAS, NP  torsemide  (DEMADEX ) 20 MG tablet Take 2 tablets (40 mg total) by mouth daily. 09/28/23   Ricky Fines, MD  traZODone  (DESYREL ) 150 MG tablet Take 1-2 tablets (150-300 mg total) by mouth at bedtime  as needed for sleep. 06/22/24 07/22/24  Jude Harden GAILS, MD  triamcinolone  cream (KENALOG ) 0.1 % Apply 1 Application topically 2 (two) times daily. 05/30/24   Leath-Warren, Etta PARAS, NP  venlafaxine  XR (EFFEXOR  XR) 150 MG 24 hr capsule Take 1 capsule (150 mg total) by mouth daily with breakfast. 05/31/24   Bevely Doffing, FNP    Allergies: Firvanq  [vancomycin ], Iodine -131, Ivp dye [iodinated contrast media], Toradol [ketorolac tromethamine], Tylenol  [acetaminophen ], Nsaids, Suboxone  [buprenorphine hcl-naloxone  hcl], and Neurontin  [gabapentin ]    Review of Systems  Constitutional:  Positive for chills and fever.  Gastrointestinal:  Positive for diarrhea.  Musculoskeletal:  Positive for myalgias.  All other systems reviewed and are negative.   Updated Vital Signs BP 123/81   Pulse 99   Temp 98.5 F (36.9 C) (Oral)   Resp 18   Ht 5' 10 (1.778 m)   Wt 101.1 kg   SpO2 94%   BMI 32.00 kg/m   Physical Exam Vitals and nursing note reviewed.  Constitutional:      General: She is not in acute distress.    Appearance: Normal appearance. She is not ill-appearing.  HENT:     Head: Normocephalic and atraumatic.     Nose: Nose normal.     Mouth/Throat:     Mouth: Mucous membranes are moist.   Eyes:     Extraocular Movements: Extraocular movements intact.     Conjunctiva/sclera: Conjunctivae normal.     Pupils: Pupils are equal, round, and reactive to light.  Cardiovascular:     Rate and Rhythm: Normal rate and regular rhythm.     Pulses: Normal pulses.     Heart sounds: Normal heart sounds. No murmur heard.    No gallop.  Pulmonary:     Effort: Pulmonary effort is normal. No respiratory distress.     Breath sounds: Normal breath sounds. No stridor. No wheezing, rhonchi or rales.  Abdominal:     General: Abdomen is flat. Bowel sounds are normal. There is no distension.     Palpations: Abdomen is soft.     Tenderness: There is no abdominal tenderness. There is no guarding.  Musculoskeletal:        General: Normal range of motion.     Cervical back: Normal range of motion and neck supple. No rigidity or tenderness.  Skin:    General: Skin is warm and dry.     Findings: No bruising or rash.  Neurological:     General: No focal deficit present.     Mental Status: She is alert and oriented to person, place, and time. Mental status is at baseline.     Cranial Nerves: No cranial nerve deficit.     Sensory: No sensory deficit.     Motor: No weakness.     Coordination: Coordination normal.     Gait: Gait normal.  Psychiatric:        Mood and Affect: Mood normal.        Behavior: Behavior normal.        Thought Content: Thought content normal.        Judgment: Judgment normal.     (all labs ordered are listed, but only abnormal results are displayed) Labs Reviewed  CBG MONITORING, ED - Abnormal; Notable for the following components:      Result Value   Glucose-Capillary 319 (*)    All other components within normal limits  RESP PANEL BY RT-PCR (RSV, FLU A&B, COVID)  RVPGX2  COMPREHENSIVE METABOLIC PANEL WITH GFR  CBC WITH DIFFERENTIAL/PLATELET  URINALYSIS, ROUTINE W REFLEX MICROSCOPIC  PREGNANCY, URINE  LACTIC ACID, PLASMA  LACTIC ACID, PLASMA  BLOOD GAS, VENOUS   BETA-HYDROXYBUTYRIC ACID    EKG: None  Radiology: No results found.   Procedures   Medications Ordered in the ED  morphine  (PF) 4 MG/ML injection 4 mg (has no administration in time range)  ondansetron  (ZOFRAN ) injection 4 mg (has no administration in time range)  sodium chloride  0.9 % bolus 1,000 mL (1,000 mLs Intravenous New Bag/Given 07/01/24 1405)  methocarbamol  (ROBAXIN ) tablet 750 mg (750 mg Oral Given 07/01/24 1412)                                    Medical Decision Making Patient is doing much better at this time and is stable for discharge home.  Discussed with patient urinalysis is consistent with urinary tract infection.  Blood work has been overall unremarkable at this point.  Vital signs are stable with no indication for sepsis.  Abdominal exam is benign with no focal tenderness throughout and she had no associated CVA tenderness.  Viral swab was negative.  Hyperglycemia has improved with treatment in the emergency department and she has no indication for DKA or HHS.  Do not suspect that admission is warranted at this time.  Will continue antibiotics on an outpatient basis.  Urine culture has been ordered.  Close follow-up with primary care doctor was discussed as well as strict return precautions for any new or worsening symptoms.  Patient voiced understanding to the plan and had no additional questions.  Chest x-ray demonstrated no indication for pneumonia.  Do not suspect underlying etiology such as meningitis or encephalitis.  Amount and/or Complexity of Data Reviewed Labs: ordered. Radiology: ordered.  Risk Prescription drug management.        Final diagnoses:  None    ED Discharge Orders     None          Daralene Lonni JONETTA DEVONNA 07/01/24 2004    Melvenia Motto, MD 07/02/24 [phone]

## 2024-07-01 NOTE — Discharge Instructions (Addendum)
 Please follow-up closely with your primary care doctor on an outpatient basis for reevaluation.  Return to emergency department immediately for any new or worsening symptoms.

## 2024-07-04 ENCOUNTER — Ambulatory Visit: Payer: Self-pay

## 2024-07-04 ENCOUNTER — Emergency Department (HOSPITAL_COMMUNITY)
Admission: EM | Admit: 2024-07-04 | Discharge: 2024-07-05 | Disposition: A | Discharge status: Home / Self Care | Payer: MEDICAID | Attending: Emergency Medicine | Admitting: Emergency Medicine

## 2024-07-04 ENCOUNTER — Other Ambulatory Visit: Payer: Self-pay

## 2024-07-04 DIAGNOSIS — L309 Dermatitis, unspecified: Secondary | ICD-10-CM | POA: Diagnosis not present

## 2024-07-04 DIAGNOSIS — J449 Chronic obstructive pulmonary disease, unspecified: Secondary | ICD-10-CM | POA: Diagnosis not present

## 2024-07-04 DIAGNOSIS — K209 Esophagitis, unspecified without bleeding: Secondary | ICD-10-CM | POA: Diagnosis not present

## 2024-07-04 DIAGNOSIS — N3001 Acute cystitis with hematuria: Secondary | ICD-10-CM | POA: Diagnosis not present

## 2024-07-04 DIAGNOSIS — E119 Type 2 diabetes mellitus without complications: Secondary | ICD-10-CM | POA: Insufficient documentation

## 2024-07-04 DIAGNOSIS — Z794 Long term (current) use of insulin: Secondary | ICD-10-CM | POA: Diagnosis not present

## 2024-07-04 DIAGNOSIS — Z7901 Long term (current) use of anticoagulants: Secondary | ICD-10-CM | POA: Insufficient documentation

## 2024-07-04 DIAGNOSIS — Z7984 Long term (current) use of oral hypoglycemic drugs: Secondary | ICD-10-CM | POA: Insufficient documentation

## 2024-07-04 DIAGNOSIS — R0602 Shortness of breath: Secondary | ICD-10-CM | POA: Diagnosis present

## 2024-07-04 LAB — URINE CULTURE: Culture: >=100000 — AB

## 2024-07-04 LAB — CBC
HCT: 44.6 % (ref 36.0–46.0)
Hemoglobin: 14.4 g/dL (ref 12.0–15.0)
MCH: 28.9 pg (ref 26.0–34.0)
MCHC: 32.3 g/dL (ref 30.0–36.0)
MCV: 89.4 fL (ref 80.0–100.0)
Platelets: 102 K/uL — ABNORMAL LOW (ref 150–400)
RBC: 4.99 MIL/uL (ref 3.87–5.11)
RDW: 13.9 % (ref 11.5–15.5)
WBC: 5.6 K/uL (ref 4.0–10.5)
nRBC: 0 % (ref 0.0–0.2)

## 2024-07-04 LAB — BASIC METABOLIC PANEL WITH GFR
Anion gap: 12 (ref 5–15)
BUN: 8 mg/dL (ref 6–20)
CO2: 24 mmol/L (ref 22–32)
Calcium: 9.2 mg/dL (ref 8.9–10.3)
Chloride: 102 mmol/L (ref 98–111)
Creatinine, Ser: 0.62 mg/dL (ref 0.44–1.00)
GFR, Estimated: >60 mL/min (ref >60)
Glucose, Bld: 155 mg/dL — ABNORMAL HIGH (ref 70–99)
Potassium: 4 mmol/L (ref 3.5–5.1)
Sodium: 137 mmol/L (ref 135–145)

## 2024-07-04 LAB — HCG, SERUM, QUALITATIVE: Preg, Serum: NEGATIVE

## 2024-07-04 LAB — PRO BRAIN NATRIURETIC PEPTIDE: Pro Brain Natriuretic Peptide: <50 pg/mL (ref <300.0)

## 2024-07-04 LAB — TROPONIN T, HIGH SENSITIVITY: Troponin T High Sensitivity: <15 ng/L (ref 0–19)

## 2024-07-04 MED ORDER — METHYLPREDNISOLONE SODIUM SUCC 40 MG IJ SOLR
40.0000 mg | Freq: Once | INTRAMUSCULAR | Status: AC
  Administered 2024-07-05: 40 mg via INTRAVENOUS
  Filled 2024-07-04: qty 1

## 2024-07-04 MED ORDER — DIPHENHYDRAMINE HCL 25 MG PO CAPS
50.0000 mg | ORAL_CAPSULE | Freq: Once | ORAL | Status: AC
  Administered 2024-07-05: 50 mg via ORAL
  Filled 2024-07-04: qty 2

## 2024-07-04 MED ORDER — OXYCODONE HCL 5 MG PO TABS
5.0000 mg | ORAL_TABLET | Freq: Four times a day (QID) | ORAL | Status: DC | PRN
  Administered 2024-07-05 (×2): 5 mg via ORAL
  Filled 2024-07-04 (×2): qty 1

## 2024-07-04 MED ORDER — ONDANSETRON HCL 4 MG/2ML IJ SOLN
4.0000 mg | Freq: Once | INTRAMUSCULAR | Status: AC
  Administered 2024-07-05: 4 mg via INTRAVENOUS
  Filled 2024-07-04: qty 2

## 2024-07-04 MED ORDER — DIPHENHYDRAMINE HCL 50 MG/ML IJ SOLN: 50.0000 mg | Freq: Once | INTRAMUSCULAR | Status: AC

## 2024-07-04 NOTE — ED Triage Notes (Signed)
 Pt came in for left leg pain, chest pain, and SOB. Pt stated she has a blood clot in the left leg which was diagnosed in April.

## 2024-07-04 NOTE — ED Notes (Signed)
 Pt states that she was having some sob. O2 Sats 92%. This RN placed Pt on 1L O2 [place] for comfort per Pt's request.

## 2024-07-04 NOTE — Telephone Encounter (Signed)
 FYI Only or Action Required?: FYI only for provider.  Patient was last seen in primary care on 05/31/2024 by Bevely Doffing, FNP.  Called Nurse Triage reporting Leg Swelling. Pain  - known blood clot.   Symptoms began several weeks ago.  Interventions attempted: Other: went to ED.  Symptoms are: gradually worsening.  Triage Disposition: Go to ED Now (or PCP Triage)  Patient/caregiver understands and will follow disposition?: Yes            Copied from CRM #[id number]. Topic: Clinical - Red Word Triage >> Jul 04, 2024 11:10 AM Avram MATSU wrote: Red Word that prompted transfer to Nurse Triage: pain,swellings in legs,headache, fatigue, lethargic    ----------------------------------------------------------------------- From previous Reason for Contact - Scheduling: Patient/patient representative is calling to schedule an appointment. Refer to attachments for appointment information. Reason for Disposition  Patient sounds very sick or weak to the triager  Answer Assessment - Initial Assessment Questions 1. ONSET: When did the swelling start? (e.g., minutes, hours, days)     Before the 30th 2. LOCATION: What part of the leg is swollen?  Are both legs swollen or just one leg?     Left leg 3. SEVERITY: How bad is the swelling? (e.g., localized; mild, moderate, severe)     Moderate - severe 4. REDNESS: Is there redness or signs of infection?     yes 5. PAIN: Is the swelling painful to touch? If Yes, ask: How painful is it?   (Scale 1-10; mild, moderate or severe)     9/10 6. FEVER: Do you have a fever? If Yes, ask: What is it, how was it measured, and when did it start?      no 7. CAUSE: What do you think is causing the leg swelling?     Blood clot 8. MEDICAL HISTORY: Do you have a history of blood clots (e.g., DVT), cancer, heart failure, kidney disease, or liver failure?     yes 9. RECURRENT SYMPTOM: Have you had leg swelling before? If Yes, ask:  When was the last time? What happened that time?     yes 10. OTHER SYMPTOMS: Do you have any other symptoms? (e.g., chest pain, difficulty breathing)       no 11. PREGNANCY: Is there any chance you are pregnant? When was your last menstrual period?       no  Protocols used: Leg Swelling and Edema-A-AH

## 2024-07-04 NOTE — Telephone Encounter (Signed)
 Patient needs to visit ED

## 2024-07-05 ENCOUNTER — Emergency Department (HOSPITAL_COMMUNITY): Payer: MEDICAID

## 2024-07-05 ENCOUNTER — Telehealth (HOSPITAL_BASED_OUTPATIENT_CLINIC_OR_DEPARTMENT_OTHER): Payer: Self-pay | Admitting: *Deleted

## 2024-07-05 LAB — URINALYSIS, ROUTINE W REFLEX MICROSCOPIC
Bilirubin Urine: NEGATIVE
Glucose, UA: NEGATIVE mg/dL
Hgb urine dipstick: NEGATIVE
Ketones, ur: NEGATIVE mg/dL
Nitrite: NEGATIVE
Protein, ur: 30 mg/dL — AB
Specific Gravity, Urine: 1.033 — ABNORMAL HIGH (ref 1.005–1.030)
pH: 5 (ref 5.0–8.0)

## 2024-07-05 LAB — HEPATIC FUNCTION PANEL
ALT: 22 U/L (ref 0–44)
AST: 29 U/L (ref 15–41)
Albumin: 3.8 g/dL (ref 3.5–5.0)
Alkaline Phosphatase: 130 U/L — ABNORMAL HIGH (ref 38–126)
Bilirubin, Direct: 0.4 mg/dL — ABNORMAL HIGH (ref 0.0–0.2)
Indirect Bilirubin: 0.5 mg/dL (ref 0.3–0.9)
Total Bilirubin: 0.9 mg/dL (ref 0.0–1.2)
Total Protein: 6.6 g/dL (ref 6.5–8.1)

## 2024-07-05 LAB — LIPASE, BLOOD: Lipase: 11 U/L (ref 11–51)

## 2024-07-05 LAB — TROPONIN T, HIGH SENSITIVITY: Troponin T High Sensitivity: <15 ng/L (ref 0–19)

## 2024-07-05 MED ORDER — HYOSCYAMINE SULFATE 0.125 MG SL SUBL
0.2500 mg | SUBLINGUAL_TABLET | Freq: Once | SUBLINGUAL | Status: AC
  Administered 2024-07-05: 0.25 mg via SUBLINGUAL
  Filled 2024-07-05: qty 2

## 2024-07-05 MED ORDER — FENTANYL CITRATE PF 50 MCG/ML IJ SOSY
50.0000 ug | PREFILLED_SYRINGE | Freq: Once | INTRAMUSCULAR | Status: AC
  Administered 2024-07-05: 50 ug via INTRAVENOUS
  Filled 2024-07-05: qty 1

## 2024-07-05 MED ORDER — IPRATROPIUM-ALBUTEROL 0.5-2.5 (3) MG/3ML IN SOLN
3.0000 mL | Freq: Once | RESPIRATORY_TRACT | Status: AC
  Administered 2024-07-05: 3 mL via RESPIRATORY_TRACT
  Filled 2024-07-05: qty 3

## 2024-07-05 MED ORDER — BETAMETHASONE DIPROPIONATE 0.05 % EX OINT: TOPICAL_OINTMENT | Freq: Two times a day (BID) | CUTANEOUS | 0 refills | Status: DC

## 2024-07-05 MED ORDER — PANTOPRAZOLE SODIUM 40 MG IV SOLR
40.0000 mg | Freq: Once | INTRAVENOUS | Status: AC
  Administered 2024-07-05: 40 mg via INTRAVENOUS
  Filled 2024-07-05: qty 10

## 2024-07-05 MED ORDER — ALUM & MAG HYDROXIDE-SIMETH 200-200-20 MG/5ML PO SUSP
30.0000 mL | Freq: Once | ORAL | Status: AC
  Administered 2024-07-05: 30 mL via ORAL
  Filled 2024-07-05: qty 30

## 2024-07-05 MED ORDER — CEFPODOXIME PROXETIL 200 MG PO TABS: 200.0000 mg | ORAL_TABLET | Freq: Two times a day (BID) | ORAL | 0 refills | Status: AC

## 2024-07-05 MED ORDER — CEFPODOXIME PROXETIL 200 MG PO TABS: 200.0000 mg | ORAL_TABLET | Freq: Two times a day (BID) | ORAL | 0 refills | Status: DC

## 2024-07-05 MED ORDER — IOHEXOL 350 MG/ML SOLN
75.0000 mL | Freq: Once | INTRAVENOUS | Status: AC | PRN
  Administered 2024-07-05: 75 mL via INTRAVENOUS

## 2024-07-05 MED ORDER — BETAMETHASONE DIPROPIONATE 0.05 % EX OINT
TOPICAL_OINTMENT | Freq: Two times a day (BID) | CUTANEOUS | 0 refills | Status: AC
  Filled 2024-07-28: qty 30, 15d supply, fill #0

## 2024-07-05 NOTE — Telephone Encounter (Signed)
 Patient advised , she went to ED last night

## 2024-07-05 NOTE — Progress Notes (Signed)
 ED Antimicrobial Stewardship Positive Culture Follow Up   Jaime [name] is an 38 y.o. female who presented to [facility] on 07/01/2024 with a chief complaint of  Chief Complaint  Patient presents with   Generalized Body Aches    Recent Results (from the past 720 hours)  Resp panel by RT-PCR (RSV, Flu A&B, Covid) Anterior Nasal Swab     Status: None   Collection Time: 07/01/24 12:28 PM   Specimen: Anterior Nasal Swab  Result Value Ref Range Status   SARS Coronavirus 2 by RT PCR NEGATIVE NEGATIVE Final    Comment: (NOTE) SARS-CoV-2 target nucleic acids are NOT DETECTED.  The SARS-CoV-2 RNA is generally detectable in upper respiratory specimens during the acute phase of infection. The lowest concentration of SARS-CoV-2 viral copies this assay can detect is 138 copies/mL. A negative result does not preclude SARS-Cov-2 infection and should not be used as the sole basis for treatment or other patient management decisions. A negative result may occur with  improper specimen collection/handling, submission of specimen other than nasopharyngeal swab, presence of viral mutation(s) within the areas targeted by this assay, and inadequate number of viral copies(<138 copies/mL). A negative result must be combined with clinical observations, patient history, and epidemiological information. The expected result is Negative.  Fact Sheet for Patients:  BloggerCourse.com  Fact Sheet for Healthcare Providers:  SeriousBroker.it  This test is no t yet approved or cleared by the [place]  FDA and  has been authorized for detection and/or diagnosis of SARS-CoV-2 by FDA under an Emergency Use Authorization (EUA). This EUA will remain  in effect (meaning this test can be used) for the duration of the COVID-19 declaration under Section 564(b)(1) of the Act, 21 U.S.C.section 360bbb-3(b)(1), unless the authorization is terminated  or revoked  sooner.       Influenza A by PCR NEGATIVE NEGATIVE Final   Influenza B by PCR NEGATIVE NEGATIVE Final    Comment: (NOTE) The Xpert Xpress SARS-CoV-2/FLU/RSV plus assay is intended as an aid in the diagnosis of influenza from Nasopharyngeal swab specimens and should not be used as a sole basis for treatment. Nasal washings and aspirates are unacceptable for Xpert Xpress SARS-CoV-2/FLU/RSV testing.  Fact Sheet for Patients: BloggerCourse.com  Fact Sheet for Healthcare Providers: SeriousBroker.it  This test is not yet approved or cleared by the [place]  FDA and has been authorized for detection and/or diagnosis of SARS-CoV-2 by FDA under an Emergency Use Authorization (EUA). This EUA will remain in effect (meaning this test can be used) for the duration of the COVID-19 declaration under Section 564(b)(1) of the Act, 21 U.S.C. section 360bbb-3(b)(1), unless the authorization is terminated or revoked.     Resp Syncytial Virus by PCR NEGATIVE NEGATIVE Final    Comment: (NOTE) Fact Sheet for Patients: BloggerCourse.com  Fact Sheet for Healthcare Providers: SeriousBroker.it  This test is not yet approved or cleared by the [place]  FDA and has been authorized for detection and/or diagnosis of SARS-CoV-2 by FDA under an Emergency Use Authorization (EUA). This EUA will remain in effect (meaning this test can be used) for the duration of the COVID-19 declaration under Section 564(b)(1) of the Act, 21 U.S.C. section 360bbb-3(b)(1), unless the authorization is terminated or revoked.  Performed at [facility], [address], [place], [place] [address]   Urine Culture     Status: Abnormal   Collection Time: 07/01/24  1:47 PM   Specimen: Urine, Clean Catch  Result Value Ref Range Status   Specimen  Description   Final    URINE, CLEAN CATCH Performed at [facility],  [address], [place], [place] [address]    Special Requests   Final    NONE Performed at [facility], [address] [place], [place] [address]    Culture (A)  Final    >=100,000 COLONIES/mL ESCHERICHIA COLI Confirmed Extended Spectrum Beta-Lactamase Producer (ESBL).  In bloodstream infections from ESBL organisms, carbapenems are preferred over piperacillin /tazobactam. They are shown to have a lower risk of mortality.    Report Status 07/04/2024 FINAL  Final   Organism ID, Bacteria ESCHERICHIA COLI (A)  Final      Susceptibility   Escherichia coli - MIC*    AMPICILLIN  >=32 RESISTANT Resistant     CEFAZOLIN (URINE) Value in next row Resistant      >=32 RESISTANTThis is a modified FDA-approved test that has been validated and its performance characteristics determined by the reporting laboratory.  This laboratory is certified under the Clinical Laboratory Improvement Amendments CLIA as qualified to perform high complexity clinical laboratory testing.    CEFEPIME  Value in next row Sensitive      >=32 RESISTANTThis is a modified FDA-approved test that has been validated and its performance characteristics determined by the reporting laboratory.  This laboratory is certified under the Clinical Laboratory Improvement Amendments CLIA as qualified to perform high complexity clinical laboratory testing.    ERTAPENEM Value in next row Sensitive      >=32 RESISTANTThis is a modified FDA-approved test that has been validated and its performance characteristics determined by the reporting laboratory.  This laboratory is certified under the Clinical Laboratory Improvement Amendments CLIA as qualified to perform high complexity clinical laboratory testing.    CEFTRIAXONE  Value in next row Resistant      >=32 RESISTANTThis is a modified FDA-approved test that has been validated and its performance characteristics determined by the reporting laboratory.  This laboratory is certified under the Clinical Laboratory  Improvement Amendments CLIA as qualified to perform high complexity clinical laboratory testing.    CIPROFLOXACIN  Value in next row Intermediate      >=32 RESISTANTThis is a modified FDA-approved test that has been validated and its performance characteristics determined by the reporting laboratory.  This laboratory is certified under the Clinical Laboratory Improvement Amendments CLIA as qualified to perform high complexity clinical laboratory testing.    GENTAMICIN Value in next row Sensitive      >=32 RESISTANTThis is a modified FDA-approved test that has been validated and its performance characteristics determined by the reporting laboratory.  This laboratory is certified under the Clinical Laboratory Improvement Amendments CLIA as qualified to perform high complexity clinical laboratory testing.    NITROFURANTOIN  Value in next row Sensitive      >=32 RESISTANTThis is a modified FDA-approved test that has been validated and its performance characteristics determined by the reporting laboratory.  This laboratory is certified under the Clinical Laboratory Improvement Amendments CLIA as qualified to perform high complexity clinical laboratory testing.    TRIMETH /SULFA  Value in next row Resistant      >=32 RESISTANTThis is a modified FDA-approved test that has been validated and its performance characteristics determined by the reporting laboratory.  This laboratory is certified under the Clinical Laboratory Improvement Amendments CLIA as qualified to perform high complexity clinical laboratory testing.    AMPICILLIN /SULBACTAM Value in next row Resistant      >=32 RESISTANTThis is a modified FDA-approved test that has been validated and its performance characteristics determined by the  reporting laboratory.  This laboratory is certified under the Clinical Laboratory Improvement Amendments CLIA as qualified to perform high complexity clinical laboratory testing.    PIP/TAZO Value in next row Sensitive  ug/mL     <=4 SENSITIVEThis is a modified FDA-approved test that has been validated and its performance characteristics determined by the reporting laboratory.  This laboratory is certified under the Clinical Laboratory Improvement Amendments CLIA as qualified to perform high complexity clinical laboratory testing.    MEROPENEM Value in next row Sensitive      <=4 SENSITIVEThis is a modified FDA-approved test that has been validated and its performance characteristics determined by the reporting laboratory.  This laboratory is certified under the Clinical Laboratory Improvement Amendments CLIA as qualified to perform high complexity clinical laboratory testing.    * >=100,000 COLONIES/mL ESCHERICHIA COLI    [x]  Treated with cephalexin , organism resistant to prescribed antimicrobial   New antibiotic prescription: Stop cephalexin  and cefpodoxime .  If urinary symptoms present, start macrobid  100mg  po BID x 5 days  ED Provider: Darice Showers, PA-C   Celestia Venetia Rush 07/05/2024, 9:19 AM Clinical Pharmacist Monday - Friday phone -  [phone] Saturday - Sunday phone - [phone]

## 2024-07-05 NOTE — ED Notes (Signed)
 Pt maintained O2 Sats greater than 93% while ambulating on RA.

## 2024-07-05 NOTE — ED Provider Notes (Signed)
 [facility]MENT AT [facility] Provider Note   CSN: [id number] Arrival date & time: 07/04/24  2147     Patient presents with: Shortness of Breath, Leg Pain, and Chest Pain   Jaime [name] is a 38 y.o. female.  With past medical history of DVT on Eliquis , diabetes, cirrhosis, COPD, hepatitis C presents to emergency room with complaint of chest pain and shortness of breath that started 2 days ago.  She describes chest pain as central and worse when she is taking a deep breath in.  Shortness of breath is exertional and she notes that she has been able to walk small distances.  Also notes left leg pain and swelling from known DVT.  She has not noticed any redness or fevers with this.    Shortness of Breath Associated symptoms: chest pain   Leg Pain Chest Pain Associated symptoms: shortness of breath        Prior to Admission medications   Medication Sig Start Date End Date Taking? Authorizing Provider  acetaminophen  (TYLENOL ) 500 MG tablet Take 1,000 mg by mouth 2 (two) times daily as needed for moderate pain (pain score 4-6) or headache.    [provider]  albuterol  (VENTOLIN  HFA) 108 (90 Base) MCG/ACT inhaler Inhale 2 puffs into the lungs every 6 (six) hours as needed for wheezing or shortness of breath. 07/17/22   Leath-Warren, Etta PARAS, NP  apixaban  (ELIQUIS ) 5 MG TABS tablet Take 2 tablets (10 mg total) by mouth 2 (two) times daily for 6 days, THEN 1 tablet (5 mg total) 2 (two) times daily. Patient taking differently: 1 tablet (5 mg total) 2 (two) times daily. 09/28/23 06/22/24  Ricky Fines, MD  ARIPiprazole  (ABILIFY ) 20 MG tablet Take 1 tablet (20 mg total) by mouth daily. 05/31/24   Bevely Doffing, FNP  cephALEXin  (KEFLEX ) 500 MG capsule Take 1 capsule (500 mg total) by mouth 3 (three) times daily for 7 days. 07/01/24 07/08/24  Daralene Bruckner D, PA-C  chlorhexidine  (HIBICLENS ) 4 % external liquid Apply topically daily as needed. 05/13/24    Stuart Vernell Norris, PA-C  clonazePAM  (KLONOPIN ) 0.5 MG tablet Take 0.5 mg by mouth 2 (two) times daily as needed for anxiety. 08/02/23   [provider]  Continuous Glucose Receiver (FREESTYLE LIBRE 3 READER) DEVI Use the Freestyle Libre 3 Reader Device to monitor blood sugars as directed by the provider. 02/29/24   Therisa Benton PARAS, NP  Continuous Glucose Sensor (DEXCOM G7 SENSOR) MISC Inject 1 Application into the skin as directed. Change sensor every 10 days as directed. 11/04/23   Therisa Benton PARAS, NP  Continuous Glucose Sensor (FREESTYLE LIBRE 3 PLUS SENSOR) MISC USE TO CHECK GLUCOSE CONTINUOUSLY AS DIRECTED CHANGING  SENSOR  EVERY  15  DAYS 06/27/24   Therisa Benton PARAS, NP  Continuous Glucose Transmitter (DEXCOM G6 TRANSMITTER) MISC USE AS DIRECTED. 03/24/23   Therisa Benton PARAS, NP  doxycycline  (VIBRA -TABS) 100 MG tablet Take 1 tablet (100 mg total) by mouth 2 (two) times daily. 05/17/24   Tobie Suzzane POUR, MD  famotidine  (PEPCID ) 20 MG tablet Take 1 tablet (20 mg total) by mouth at bedtime. 09/28/23   Ricky Fines, MD  ferrous sulfate  325 (65 FE) MG EC tablet Take 1 tablet (325 mg total) by mouth daily with breakfast. 09/13/23   Lamon Herter M, PA-C  glucose blood (ACCU-CHEK GUIDE) test strip Use as instructed to check blood glucose four times daily 12/01/21   Therisa Benton PARAS, NP  hydrOXYzine  (  ATARAX ) 10 MG tablet Take 1 tablet (10 mg total) by mouth 3 (three) times daily as needed. 05/30/24   Leath-Warren, Etta PARAS, NP  hydrOXYzine  (VISTARIL ) 50 MG capsule Take 1 capsule (50 mg total) by mouth 3 (three) times daily as needed. 05/25/23   Del Orbe Polanco, Iliana, FNP  hyoscyamine  (ANASPAZ ) 0.125 MG TBDP disintergrating tablet Place 1 tablet (0.125 mg total) under the tongue every 6 (six) hours as needed. 12/13/23   Castaneda Mayorga, Daniel, MD  Insulin  Pen Needle (PEN NEEDLES) 32G X 6 MM MISC 1 each by Does not apply route 3 (three) times daily. 02/26/21   Therisa Benton PARAS,  NP  insulin  regular human CONCENTRATED (HUMULIN  R U-500 KWIKPEN) 500 UNIT/ML KwikPen Inject 80 Units into the skin 3 (three) times daily with meals. 03/06/24   Therisa Benton PARAS, NP  lidocaine  (LIDODERM ) 5 % Place 1 patch onto the skin daily. Remove & Discard patch within 12 hours or as directed by MD. DO not APPLY TO INFLAMED , RED SKIN 04/12/24   Antonetta Rollene BRAVO, MD  lidocaine  (XYLOCAINE ) 5 % ointment Apply 1 Application topically as needed. Patient taking differently: Apply 1 Application topically 2 (two) times daily as needed (pain). 04/27/23   Del Orbe Polanco, Iliana, FNP  lisinopril  (ZESTRIL ) 5 MG tablet Take 5 mg by mouth daily.    [provider]  metFORMIN  (GLUCOPHAGE ) 1000 MG tablet Take 1 tablet (1,000 mg total) by mouth 2 (two) times daily with a meal. take 1 tablet by mouth 2 times a day with a meal. 11/04/23   Therisa Benton PARAS, NP  methadone  (DOLOPHINE ) 10 MG/ML solution Take 105 mg by mouth daily.    [provider]  methocarbamol  (ROBAXIN ) 750 MG tablet Take 1 tablet (750 mg total) by mouth 3 (three) times daily. 07/01/24   Daralene Bruckner D, PA-C  mupirocin  ointment (BACTROBAN ) 2 % Apply 1 Application topically 2 (two) times daily. 05/13/24   Stuart Vernell Norris, PA-C  NARCAN  4 MG/0.1ML LIQD nasal spray kit Place 1 spray into the nose once as needed (OD). 04/07/24   [provider]  nystatin  cream (MYCOSTATIN ) Apply 1 Application topically 2 (two) times daily. 10/22/23   Antonetta Rollene BRAVO, MD  Omega-3 Fatty Acids (ENTERIC FISH OIL) 1000 MG CPDR Take 1,000 mg by mouth 3 (three) times daily.    [provider]  omeprazole  (PRILOSEC) 40 MG capsule TAKE 1 CAPSULE BY MOUTH TWICE DAILY. 05/10/23   Carlan, Chelsea L, NP  ondansetron  (ZOFRAN ) 4 MG tablet Take 1 tablet (4 mg total) by mouth every 8 (eight) hours as needed for nausea or vomiting. 03/08/23   Carlan, Chelsea L, NP  Oxycodone  HCl 10 MG TABS Take 1 tablet (10 mg total) by mouth every 6 (six)  hours as needed (pain). 09/28/23   Ricky Fines, MD  pantoprazole  (PROTONIX ) 20 MG tablet Take 20 mg by mouth daily. 12/30/23   [provider]  potassium chloride  (KLOR-CON ) 10 MEQ tablet Take 10 mEq by mouth 2 (two) times daily. 08/10/23   [provider]  propranolol  (INDERAL ) 10 MG tablet Take 1 tablet (10 mg total) by mouth 2 (two) times daily. 09/28/23   Ricky Fines, MD  silver  sulfADIAZINE  (SILVADENE ) 1 % cream Apply 1 Application topically 2 (two) times daily. 05/31/24   Bevely Doffing, FNP  tirzepatide  (MOUNJARO ) 7.5 MG/0.5ML Pen Inject 7.5 mg into the skin once a week. 03/06/24   Therisa Benton PARAS, NP  torsemide  (DEMADEX ) 20 MG tablet Take  2 tablets (40 mg total) by mouth daily. 09/28/23   Ricky Fines, MD  traZODone  (DESYREL ) 150 MG tablet Take 1-2 tablets (150-300 mg total) by mouth at bedtime as needed for sleep. 06/22/24 07/22/24  Jude Harden GAILS, MD  triamcinolone  cream (KENALOG ) 0.1 % Apply 1 Application topically 2 (two) times daily. 05/30/24   Leath-Warren, Etta PARAS, NP  venlafaxine  XR (EFFEXOR  XR) 150 MG 24 hr capsule Take 1 capsule (150 mg total) by mouth daily with breakfast. 05/31/24   Bevely Doffing, FNP    Allergies: Firvanq  [vancomycin ], Iodine -131, Ivp dye [iodinated contrast media], Toradol [ketorolac tromethamine], Tylenol  [acetaminophen ], Nsaids, Suboxone  [buprenorphine hcl-naloxone  hcl], and Neurontin  [gabapentin ]    Review of Systems  Respiratory:  Positive for shortness of breath.   Cardiovascular:  Positive for chest pain.    Updated Vital Signs BP 116/72   Pulse (!) 101   Temp (!) 97.5 F (36.4 C) (Oral)   Resp 17   Ht 5' 10 (1.778 m)   Wt 100.7 kg   SpO2 97%   BMI 31.85 kg/m   Physical Exam Vitals and nursing note reviewed.  Constitutional:      General: She is not in acute distress.    Appearance: She is not toxic-appearing.  HENT:     Head: Normocephalic and atraumatic.  Eyes:     General: No scleral icterus.     Conjunctiva/sclera: Conjunctivae normal.  Cardiovascular:     Rate and Rhythm: Normal rate and regular rhythm.     Pulses: Normal pulses.     Heart sounds: Normal heart sounds.  Pulmonary:     Effort: Pulmonary effort is normal. No respiratory distress.     Breath sounds: Normal breath sounds. No wheezing.  Abdominal:     General: Abdomen is flat. Bowel sounds are normal.     Palpations: Abdomen is soft.     Tenderness: There is no abdominal tenderness.  Musculoskeletal:     Right lower leg: Edema present.     Left lower leg: Edema present.  Skin:    General: Skin is warm and dry.     Findings: No lesion.  Neurological:     General: No focal deficit present.     Mental Status: She is alert and oriented to person, place, and time. Mental status is at baseline.     (all labs ordered are listed, but only abnormal results are displayed) Labs Reviewed  BASIC METABOLIC PANEL WITH GFR - Abnormal; Notable for the following components:      Result Value   Glucose, Bld 155 (*)    All other components within normal limits  CBC - Abnormal; Notable for the following components:   Platelets 102 (*)    All other components within normal limits  HEPATIC FUNCTION PANEL - Abnormal; Notable for the following components:   Alkaline Phosphatase 130 (*)    Bilirubin, Direct 0.4 (*)    All other components within normal limits  URINALYSIS, ROUTINE W REFLEX MICROSCOPIC - Abnormal; Notable for the following components:   Color, Urine AMBER (*)    APPearance CLOUDY (*)    Specific Gravity, Urine 1.033 (*)    Protein, ur 30 (*)    Leukocytes,Ua MODERATE (*)    Bacteria, UA RARE (*)    All other components within normal limits  URINE CULTURE  HCG, SERUM, QUALITATIVE  PRO BRAIN NATRIURETIC PEPTIDE  LIPASE, BLOOD  TROPONIN T, HIGH SENSITIVITY  TROPONIN T, HIGH SENSITIVITY    EKG: EKG Interpretation Date/Time:  Tuesday July 04 2024 23:25:50 EDT Ventricular Rate:  107 PR  Interval:  161 QRS Duration:  83 QT Interval:  345 QTC Calculation: 461 R Axis:   69  Text Interpretation: Sinus tachycardia Confirmed by Griselda Norris [phone]) on 07/04/2024 11:41:32 PM  Radiology: No results found.   Procedures   Medications Ordered in the ED  oxyCODONE  (Oxy IR/ROXICODONE ) immediate release tablet 5 mg (5 mg Oral Given 07/05/24 0003)  methylPREDNISolone  sodium succinate  (SOLU-MEDROL ) 40 mg/mL injection 40 mg (40 mg Intravenous Given 07/05/24 0002)  diphenhydrAMINE  (BENADRYL ) capsule 50 mg (50 mg Oral Given 07/05/24 0302)    Or  diphenhydrAMINE  (BENADRYL ) injection 50 mg ( Intravenous See Alternative 07/05/24 0302)  ondansetron  (ZOFRAN ) injection 4 mg (4 mg Intravenous Given 07/05/24 0002)  fentaNYL  (SUBLIMAZE ) injection 50 mcg (50 mcg Intravenous Given 07/05/24 0149)  iohexol  (OMNIPAQUE ) 350 MG/ML injection 75 mL (75 mLs Intravenous Contrast Given 07/05/24 0413)                                    Medical Decision Making Amount and/or Complexity of Data Reviewed Labs: ordered. Radiology: ordered.  Risk OTC drugs. Prescription drug management.   This patient presents to the ED for concern of shortness of breath, this involves an extensive number of treatment options, and is a complaint that carries with it a high risk of complications and morbidity.  The differential diagnosis includes CHF, PE, pneumonia, pneumothorax, pulmonary embolus, asthma, COPD   Co morbidities that complicate the patient evaluation  DVT PE on Eliquis   Lab Tests:  I personally interpreted labs.  The pertinent results include:   Patient has no leukocytosis.  Troponins under 15, proBNP under 50.  Urine with moderate leukocytes and rare bacteria, but also has squamous cells present.  Urine sent for culture.   Imaging Studies ordered:  I ordered imaging studies including chest PE study I independently visualized and interpreted imaging which showed no evidence of pulmonary embolism, mild  thickening of the distal esophagus with differential diagnosis including esophagitis.  Chronic cirrhosis.  Chronic splenomegaly. I agree with the radiologist interpretation   Cardiac Monitoring: / EKG:  The patient was maintained on a cardiac monitor.  I personally viewed and interpreted the cardiac monitored which showed an underlying rhythm of: Sinus rhythm    Problem List / ED Course / Critical interventions / Medication management  Patient reports with chest pain shortness of breath.  She does have history of DVT and PE.  She reports she has been compliant with her Eliquis .  She has worsening chest pain when she takes a deep breath in.  Also work notes shortness of breath with ambulating.  Her EKG shows normal sinus rhythm.  She has normal troponin and normal proBNP.  Her CT scan PE study shows no evidence of pulmonary embolism.  It does show esophageal thickening with probable esophagitis which is likely contributing to her symptoms. She is tolerating oral intake.  She is feeling better after GI cocktail and Protonix .  Will give GI follow-up.   She also notes left anterior shin rash that is dry scaly and itchy.  This has been present for several weeks.  She reports she is using some cream at home but she is unsure what is called.  This appears to be a dermatitis.  She has no cellulitis.  Feel topical steroid would be appropriate first line treatment for this. I ordered medication including  patient was premedicated for CT scan.  She was given DuoNeb and Solu-Medrol .  Also given GI cocktail and Protonix . Reevaluation of the patient after these medicines showed that the patient improved I have reviewed the patients home medicines and have made adjustments as needed. Patient was hemodynamically stable and well-appearing throughout stay.  She ambulated with about any decrease in her oxygen saturations.  She is feeling better after DuoNeb and Solu-Medrol  in terms of shortness of breath.  Feeling  better after GI cocktail Protonix  for reported chest pain feels likely secondary to esophagitis.  Feel she is stable for discharge with outpatient follow-up.  Will give GI follow-up for esophagitis.      Final diagnoses:  Esophagitis  Dermatitis  Acute cystitis with hematuria    ED Discharge Orders          Ordered    betamethasone  dipropionate (DIPROLENE ) 0.05 % ointment  2 times daily        07/05/24 0544    cefpodoxime  (VANTIN ) 200 MG tablet  2 times daily        07/05/24 0545               Gennifer Potenza, Warren SAILOR, PA-C 07/05/24 0550    Griselda Norris, MD 07/05/24 [phone]

## 2024-07-05 NOTE — Discharge Instructions (Addendum)
 Please make sure you are taking omeprazole  for esophagitis.  Try bland foods and avoid triggers.  Take Tums for breakthrough pain.  Follow-up with [facility] you will have to call to schedule appointment.  Please follow-up with primary care for recheck of left lower leg dermatitis.  I have sent in a new steroid ointment to your pharmacy.  Please use as prescribed.  Take the antibiotic for urinary tract infection.  Return to emergency room with new or worsening symptoms.
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
## 2024-07-05 NOTE — Telephone Encounter (Signed)
 Post ED Visit - Positive Culture Follow-up: Unsuccessful Patient Follow-up  Culture assessed and recommendations reviewed by:  []  Rankin Dee, Pharm.D. [x]  Venetia Gully, Pharm.D., BCPS AQ-ID []  Garrel Crews, Pharm.D., BCPS []  Almarie Lunger, Pharm.D., BCPS []  [place], [address].D., BCPS, AAHIVP []  Rosaline Bihari, Pharm.D., BCPS, AAHIVP []  Massie Rigg, PharmD []  Jodie Rower, PharmD, BCPS  Positive urine culture  []  Patient discharged without antimicrobial prescription and treatment is now indicated [x]  Organism is resistant to prescribed ED discharge antimicrobial []  Patient with positive blood cultures Patient chart reviewed by Sonny Showers PA-C  Plan: Stop Cephalexin  and cefpodoxime .  If urinary symptoms present Macrobid  100 mg BID x 5 days  Unable to contact patient after 3 attempts, letter will be sent to address on file  Jama Wyman Kipper 07/05/2024, 11:01 AM
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
## 2024-07-07 ENCOUNTER — Encounter: Payer: Self-pay | Admitting: Nurse Practitioner

## 2024-07-07 ENCOUNTER — Ambulatory Visit (INDEPENDENT_AMBULATORY_CARE_PROVIDER_SITE_OTHER): Payer: MEDICAID | Admitting: Nurse Practitioner

## 2024-07-07 VITALS — BP 128/78 | HR 108 | Ht 70.0 in | Wt 226.0 lb

## 2024-07-07 DIAGNOSIS — Z794 Long term (current) use of insulin: Secondary | ICD-10-CM

## 2024-07-07 DIAGNOSIS — Z7984 Long term (current) use of oral hypoglycemic drugs: Secondary | ICD-10-CM

## 2024-07-07 DIAGNOSIS — E1165 Type 2 diabetes mellitus with hyperglycemia: Secondary | ICD-10-CM | POA: Diagnosis not present

## 2024-07-07 DIAGNOSIS — E782 Mixed hyperlipidemia: Secondary | ICD-10-CM

## 2024-07-07 DIAGNOSIS — Z7985 Long-term (current) use of injectable non-insulin antidiabetic drugs: Secondary | ICD-10-CM | POA: Diagnosis not present

## 2024-07-07 LAB — POCT GLYCOSYLATED HEMOGLOBIN (HGB A1C): Hemoglobin A1C: 8.4 % — AB (ref 4.0–5.6)

## 2024-07-07 MED ORDER — FLUCONAZOLE 150 MG PO TABS: 150.0000 mg | ORAL_TABLET | Freq: Once | ORAL | 0 refills | Status: AC

## 2024-07-07 MED ORDER — FREESTYLE LIBRE 3 PLUS SENSOR MISC
3 refills | Status: DC
  Filled 2024-07-28: qty 6, 90d supply, fill #0

## 2024-07-07 MED ORDER — NICOTINE 14 MG/24HR TD PT24
14.0000 mg | MEDICATED_PATCH | Freq: Every day | TRANSDERMAL | 0 refills | Status: AC
Start: 2024-07-07 — End: ?

## 2024-07-07 NOTE — Progress Notes (Signed)
 07/07/2024, 10:40 AM     Endocrinology Follow Up Visit  Subjective:    Patient ID: Jaime [name], female    DOB: 1985-12-18.  Jaime [name] is being seen in follow up for management of currently uncontrolled symptomatic diabetes requested by  Terry Wilhelmena Lloyd Hilario, FNP.   Past Medical History:  Diagnosis Date   Acute encephalopathy 08/31/2020   Adjustment disorder with depressed mood 09/07/2020   Anxiety    Asthma    Chronic abdominal pain    Chronic back pain    Chronic prescription opiate use 02/16/2022   Cirrhosis (HCC)    Cirrhosis (HCC)    COPD (chronic obstructive pulmonary disease) (HCC)    Depression    Diabetes mellitus without complication (HCC)    Diabetes mellitus, type II (HCC)    Diverticulosis    DVT (deep venous thrombosis) (HCC) 02/04/2023   taking lovenox  prior to procedure   Fatty liver 03/07/2020   GERD (gastroesophageal reflux disease)    Hepatitis C    Hernia, abdominal    Hyperglycemia due to type 2 diabetes mellitus (HCC) 05/24/2020   Hyperlipidemia    IBS (irritable bowel syndrome) 02/16/2022   Insomnia    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)    Lupus    Migraine headache    Morbid obesity (HCC) 05/24/2020   Neuropathy    Nocturnal seizures (HCC)    Pain of upper abdomen 07/28/2021   Peptic ulcer    Spleen enlarged    Splenomegaly     Past Surgical History:  Procedure Laterality Date   BIOPSY  11/25/2021   Procedure: BIOPSY;  Surgeon: Eartha Angelia Sieving, MD;  [facility];  Service: Gastroenterology;;   CHOLECYSTECTOMY     COLONOSCOPY WITH PROPOFOL  N/A 11/25/2021   Procedure: COLONOSCOPY WITH PROPOFOL ;  Surgeon: Eartha Angelia Sieving, MD;  Location: [facility];  Service: Gastroenterology;  Laterality: N/A;  805   COLONOSCOPY WITH PROPOFOL  N/A 03/31/2022   Procedure: COLONOSCOPY WITH PROPOFOL ;   Surgeon: Eartha Angelia Sieving, MD;  Location: [facility];  Service: Gastroenterology;  Laterality: N/A;  730   ESOPHAGOGASTRODUODENOSCOPY  06/2020   done at [facility], candida in upper esophagus (treated with diflucan ), ulcerative esophagitis at GE junction, gastritis in stomach, single ulcer in duodenal bulb, with duodenal mucosa showing no abnormality. No presence of varices   ESOPHAGOGASTRODUODENOSCOPY (EGD) WITH PROPOFOL  N/A 11/25/2021   Procedure: ESOPHAGOGASTRODUODENOSCOPY (EGD) WITH PROPOFOL ;  Surgeon: Eartha Angelia Sieving, MD;  [facility];  Service: Gastroenterology;  Laterality: N/A;   ESOPHAGOGASTRODUODENOSCOPY (EGD) WITH PROPOFOL  N/A 03/31/2022   Procedure: ESOPHAGOGASTRODUODENOSCOPY (EGD) WITH PROPOFOL ;  Surgeon: Eartha Angelia Sieving, MD;  [facility]: [facility];  Service: Gastroenterology;  Laterality: N/A;   IVC FILTER REMOVAL N/A 09/16/2023   Procedure: IVC FILTER REMOVAL;  Surgeon: Pearline Norman RAMAN, MD;  Location[facility] INVASIVE CV LAB;  Service: Cardiovascular;  Laterality: N/A;   IVC FILTER REMOVAL N/A 02/14/2024   Procedure: IVC FILTER REMOVAL;  Surgeon: Pearline Norman RAMAN, MD;  Location: [facility] INVASIVE CV LAB;  Service: Cardiovascular;  Laterality: N/A;   PERIPHERAL  VASCULAR THROMBECTOMY Left 09/16/2023   Procedure: PERIPHERAL VASCULAR THROMBECTOMY;  Surgeon: Pearline Norman RAMAN, MD;  Location: [facility] INVASIVE CV LAB;  Service: Cardiovascular;  Laterality: Left;    Social History   Socioeconomic History   Marital status: Married    Spouse name: Not on file   Number of children: Not on file   Years of education: Not on file   Highest education level: Some college, no degree  Occupational History   Not on file  Tobacco Use   Smoking status: Every Day    Current packs/day: 0.50    Types: Cigarettes    Passive exposure: Current   Smokeless tobacco: Never  Vaping Use   Vaping status: Every Day   Substances: Nicotine , Flavoring  Substance and Sexual  Activity   Alcohol use: No   Drug use: No   Sexual activity: Yes    Birth control/protection: Implant  Other Topics Concern   Not on file  Social History Narrative   Not on file   Social Drivers of Health   Financial Resource Strain: High Risk (12/09/2023)   Overall Financial Resource Strain (CARDIA)    Difficulty of Paying Living Expenses: Very hard  Food Insecurity: Food Insecurity Present (12/09/2023)   Hunger Vital Sign    Worried About Running Out of Food in the Last Year: Often true    Ran Out of Food in the Last Year: Often true  Transportation Needs: No Transportation Needs (12/09/2023)   PRAPARE - Administrator, Civil Service (Medical): No    Lack of Transportation (Non-Medical): No  Physical Activity: Insufficiently Active (12/09/2023)   Exercise Vital Sign    Days of Exercise per Week: 1 day    Minutes of Exercise per Session: 10 min  Stress: Stress Concern Present (05/09/2020)   [employer] of Occupational Health - Occupational Stress Questionnaire    Feeling of Stress : Very much  Social Connections: Moderately Integrated (12/09/2023)   Social Connection and Isolation Panel    Frequency of Communication with Friends and Family: More than three times a week    Frequency of Social Gatherings with Friends and Family: More than three times a week    Attends Religious Services: 1 to 4 times per year    Active Member of [employer] or Organizations: Yes    Attends Banker Meetings: 1 to 4 times per year    Marital Status: Separated    Family History  Problem Relation Age of Onset   Hyperlipidemia Maternal Grandfather     Outpatient Encounter Medications as of 07/07/2024  Medication Sig   acetaminophen  (TYLENOL ) 500 MG tablet Take 1,000 mg by mouth 2 (two) times daily as needed for moderate pain (pain score 4-6) or headache.   albuterol  (VENTOLIN  HFA) 108 (90 Base) MCG/ACT inhaler Inhale 2 puffs into the lungs every 6 (six) hours as needed for wheezing  or shortness of breath.   apixaban  (ELIQUIS ) 5 MG TABS tablet Take 2 tablets (10 mg total) by mouth 2 (two) times daily for 6 days, THEN 1 tablet (5 mg total) 2 (two) times daily.   ARIPiprazole  (ABILIFY ) 20 MG tablet Take 1 tablet (20 mg total) by mouth daily.   betamethasone  dipropionate (DIPROLENE ) 0.05 % ointment Apply topically 2 (two) times daily.   cefpodoxime  (VANTIN ) 200 MG tablet Take 1 tablet (200 mg total) by mouth 2 (two) times daily for 7 days.   cephALEXin  (KEFLEX ) 500 MG capsule Take 1 capsule (500 mg total) by  mouth 3 (three) times daily for 7 days.   chlorhexidine  (HIBICLENS ) 4 % external liquid Apply topically daily as needed.   clonazePAM  (KLONOPIN ) 0.5 MG tablet Take 0.5 mg by mouth 2 (two) times daily as needed for anxiety.   Continuous Glucose Receiver (FREESTYLE LIBRE 3 READER) DEVI Use the Freestyle Libre 3 Reader Device to monitor blood sugars as directed by the provider.   famotidine  (PEPCID ) 20 MG tablet Take 1 tablet (20 mg total) by mouth at bedtime.   ferrous sulfate  325 (65 FE) MG EC tablet Take 1 tablet (325 mg total) by mouth daily with breakfast.   fluconazole  (DIFLUCAN ) 150 MG tablet Take 1 tablet (150 mg total) by mouth once for 1 dose.   glucose blood (ACCU-CHEK GUIDE) test strip Use as instructed to check blood glucose four times daily   hydrOXYzine  (ATARAX ) 10 MG tablet Take 1 tablet (10 mg total) by mouth 3 (three) times daily as needed.   hydrOXYzine  (VISTARIL ) 50 MG capsule Take 1 capsule (50 mg total) by mouth 3 (three) times daily as needed.   hyoscyamine  (ANASPAZ ) 0.125 MG TBDP disintergrating tablet Place 1 tablet (0.125 mg total) under the tongue every 6 (six) hours as needed.   Insulin  Pen Needle (PEN NEEDLES) 32G X 6 MM MISC 1 each by Does not apply route 3 (three) times daily.   insulin  regular human CONCENTRATED (HUMULIN  R U-500 KWIKPEN) 500 UNIT/ML KwikPen Inject 80 Units into the skin 3 (three) times daily with meals.   lidocaine  (LIDODERM ) 5 %  Place 1 patch onto the skin daily. Remove & Discard patch within 12 hours or as directed by MD. DO not APPLY TO INFLAMED , RED SKIN   lidocaine  (XYLOCAINE ) 5 % ointment Apply 1 Application topically as needed. (Patient taking differently: Apply 1 Application topically 2 (two) times daily as needed (pain).)   lisinopril  (ZESTRIL ) 5 MG tablet Take 5 mg by mouth daily.   methadone  (DOLOPHINE ) 10 MG/ML solution Take 105 mg by mouth daily.   methocarbamol  (ROBAXIN ) 750 MG tablet Take 1 tablet (750 mg total) by mouth 3 (three) times daily.   mupirocin  ointment (BACTROBAN ) 2 % Apply 1 Application topically 2 (two) times daily.   NARCAN  4 MG/0.1ML LIQD nasal spray kit Place 1 spray into the nose once as needed (OD).   nicotine  (NICODERM CQ  - DOSED IN MG/24 HOURS) 14 mg/24hr patch Place 1 patch (14 mg total) onto the skin daily.   nystatin  cream (MYCOSTATIN ) Apply 1 Application topically 2 (two) times daily.   Omega-3 Fatty Acids (ENTERIC FISH OIL) 1000 MG CPDR Take 1,000 mg by mouth 3 (three) times daily.   omeprazole  (PRILOSEC) 40 MG capsule TAKE 1 CAPSULE BY MOUTH TWICE DAILY.   ondansetron  (ZOFRAN ) 4 MG tablet Take 1 tablet (4 mg total) by mouth every 8 (eight) hours as needed for nausea or vomiting.   Oxycodone  HCl 10 MG TABS Take 1 tablet (10 mg total) by mouth every 6 (six) hours as needed (pain).   pantoprazole  (PROTONIX ) 20 MG tablet Take 20 mg by mouth daily.   potassium chloride  (KLOR-CON ) 10 MEQ tablet Take 10 mEq by mouth 2 (two) times daily.   propranolol  (INDERAL ) 10 MG tablet Take 1 tablet (10 mg total) by mouth 2 (two) times daily.   silver  sulfADIAZINE  (SILVADENE ) 1 % cream Apply 1 Application topically 2 (two) times daily.   tirzepatide  (MOUNJARO ) 7.5 MG/0.5ML Pen Inject 7.5 mg into the skin once a week.   torsemide  (DEMADEX ) 20 MG  tablet Take 2 tablets (40 mg total) by mouth daily.   traZODone  (DESYREL ) 150 MG tablet Take 1-2 tablets (150-300 mg total) by mouth at bedtime as needed for  sleep.   venlafaxine  XR (EFFEXOR  XR) 150 MG 24 hr capsule Take 1 capsule (150 mg total) by mouth daily with breakfast.   [DISCONTINUED] Continuous Glucose Sensor (FREESTYLE LIBRE 3 PLUS SENSOR) MISC USE TO CHECK GLUCOSE CONTINUOUSLY AS DIRECTED CHANGING  SENSOR  EVERY  15  DAYS   [DISCONTINUED] metFORMIN  (GLUCOPHAGE ) 1000 MG tablet Take 1 tablet (1,000 mg total) by mouth 2 (two) times daily with a meal. take 1 tablet by mouth 2 times a day with a meal.   Continuous Glucose Sensor (FREESTYLE LIBRE 3 PLUS SENSOR) MISC Change sensor every 15 days.   [DISCONTINUED] Continuous Glucose Sensor (DEXCOM G7 SENSOR) MISC Inject 1 Application into the skin as directed. Change sensor every 10 days as directed. (Patient not taking: Reported on 07/07/2024)   [DISCONTINUED] Continuous Glucose Transmitter (DEXCOM G6 TRANSMITTER) MISC USE AS DIRECTED. (Patient not taking: Reported on 07/07/2024)   [DISCONTINUED] doxycycline  (VIBRA -TABS) 100 MG tablet Take 1 tablet (100 mg total) by mouth 2 (two) times daily. (Patient not taking: Reported on 07/07/2024)   Facility-Administered Encounter Medications as of 07/07/2024  Medication   etonogestrel  (NEXPLANON ) implant 68 mg    ALLERGIES: Allergies  Allergen Reactions   Firvanq  [Vancomycin ] Swelling    Facial swelling   Iodine -131 Anaphylaxis, Shortness Of Breath and Swelling   Ivp Dye [Iodinated Contrast Media] Anaphylaxis    Skin redness Difficulty walking   Toradol [Ketorolac Tromethamine] Shortness Of Breath    Flares ulcers   Tylenol  [Acetaminophen ] Other (See Comments)    Hx of cirrhosis    Nsaids Other (See Comments)    Flares ulcers   Suboxone  [Buprenorphine Hcl-Naloxone  Hcl] Other (See Comments)    Withdrawal symptoms    Neurontin  [Gabapentin ] Itching    VACCINATION STATUS: Immunization History  Administered Date(s) Administered   Influenza Inj Mdck Quad Pf 08/19/2022   Influenza, Seasonal, Injecte, Preservative Fre 09/26/2023   Influenza,inj,Quad  PF,6+ Mos 07/18/2016, 07/07/2018, 07/04/2019, 07/27/2020   Influenza-Unspecified 08/11/2017, 07/19/2018   Moderna SARS-COV2 Booster Vaccination 04/09/2022   Moderna Sars-Covid-2 Vaccination 03/07/2020, 04/04/2020   PNEUMOCOCCAL CONJUGATE-20 09/26/2023   Pneumococcal Polysaccharide-23 07/18/2016, 07/07/2018, 07/04/2019, 08/01/2020   Tdap 07/04/2019    Diabetes She presents for her follow-up diabetic visit. She has type 2 diabetes mellitus. Onset time: She was diagnosed at approximate age of 28 years. Her disease course has been worsening. There are no hypoglycemic associated symptoms. Pertinent negatives for hypoglycemia include no pallor. (Had symptoms of hypoglycemia when glucose dropped to 117.) Associated symptoms include foot paresthesias and weight loss. Pertinent negatives for diabetes include no fatigue, no polydipsia, no polyphagia and no polyuria. There are no hypoglycemic complications. (History of unresponsiveness requiring EMS and hospitalization- none recent) Symptoms are improving. Diabetic complications include heart disease and peripheral neuropathy. Risk factors for coronary artery disease include diabetes mellitus, obesity, sedentary lifestyle, dyslipidemia and hypertension. Current diabetic treatment includes intensive insulin  program and oral agent (monotherapy) (and Mounjaro ). She is compliant with treatment most of the time. Her weight is fluctuating minimally. She is following a low salt and generally healthy diet. When asked about meal planning, she reported none. She has had a previous visit with a dietitian. She participates in exercise intermittently. Her home blood glucose trend is increasing steadily. Her overall blood glucose range is >200 mg/dl. (She presents today with her CGM showing above  target glycemic profile overall.  Her POCT A1c today is 8.4%, increasing from last visit of 6.5%.  She notes she has moved to [facility], has gotten an emotional support dog since last  visit.  She has been seen in the ED for various reasons ranging from UTI to esophagitis.  She has been on antibiotics.  She notes she is doing her best to eat right but with her recent multiple moves it has been difficulty.  She does express some depression to me today.) An ACE inhibitor/angiotensin II receptor blocker is being taken. She does not see a podiatrist.Eye exam is not current.    Review of systems  Constitutional: +stable body weight,  current Body mass index is 32.43 kg/m. , + fatigue, no subjective hyperthermia, no subjective hypothermia Eyes: no blurry vision, no xerophthalmia ENT: no sore throat, no nodules palpated in throat, no dysphagia/odynophagia, no hoarseness Cardiovascular: no chest pain, no shortness of breath, no palpitations, no leg swelling Respiratory: no cough, no shortness of breath Gastrointestinal: no nausea/vomiting/diarrhea Genitourinary: + itchy, white discharge- thinks has yeast infection from recent Abx use. Musculoskeletal: no muscle/joint aches Skin: no rashes, no hyperemia Neurological: no tremors, no numbness, no tingling, no dizziness Psychiatric: + depression, no anxiety  Objective:     BP 128/78 (BP Location: Left Arm, Patient Position: Sitting, Cuff Size: Large)   Pulse (!) 108   Ht 5' 10 (1.778 m)   Wt 226 lb (102.5 kg)   BMI 32.43 kg/m   Wt Readings from Last 3 Encounters:  07/07/24 226 lb (102.5 kg)  07/04/24 222 lb (100.7 kg)  07/01/24 222 lb 15.9 oz (101.1 kg)    BP Readings from Last 3 Encounters:  07/07/24 128/78  07/05/24 116/74  07/01/24 121/69     Physical Exam- Limited  Constitutional:  Body mass index is 32.43 kg/m. , not in acute distress, normal state of mind Eyes:  EOMI, no exophthalmos Musculoskeletal: no gross deformities, strength intact in all four extremities, no gross restriction of joint movements Skin:  no rashes, no hyperemia Neurological: no tremor with outstretched hands   Diabetic Foot Exam -  Simple   No data filed    CMP ( most recent) CMP     Component Value Date/Time   NA 137 07/04/2024 2204   NA 140 04/05/2024 1036   K 4.0 07/04/2024 2204   CL 102 07/04/2024 2204   CO2 24 07/04/2024 2204   GLUCOSE 155 (H) 07/04/2024 2204   BUN 8 07/04/2024 2204   BUN 7 04/05/2024 1036   CREATININE 0.62 07/04/2024 2204   CREATININE 0.66 05/18/2022 1113   CALCIUM  9.2 07/04/2024 2204   PROT 6.6 07/04/2024 2338   PROT 5.8 (L) 01/01/2023 1408   ALBUMIN  3.8 07/04/2024 2338   ALBUMIN  3.3 (L) 01/01/2023 1408   AST 29 07/04/2024 2338   ALT 22 07/04/2024 2338   ALKPHOS 130 (H) 07/04/2024 2338   BILITOT 0.9 07/04/2024 2338   BILITOT 1.3 (H) 01/01/2023 1408   GFRNONAA >60 07/04/2024 2204   GFRAA >60 08/01/2020 0704     Diabetic Labs (most recent): Lab Results  Component Value Date   HGBA1C 8.4 (A) 07/07/2024   HGBA1C 6.5 (A) 03/06/2024   HGBA1C 7.8 (A) 11/04/2023   MICROALBUR 30 mg/L 03/06/2024   MICROALBUR 30 12/10/2021   MICROALBUR 80 02/26/2021     Lipid Panel ( most recent) Lipid Panel     Component Value Date/Time   CHOL 174 04/05/2024 1036   TRIG 100 04/05/2024  1036   HDL 50 04/05/2024 1036   CHOLHDL 3.5 04/05/2024 1036   CHOLHDL 3.8 09/17/2023 0426   VLDL 18 09/17/2023 0426   LDLCALC 106 (H) 04/05/2024 1036   LABVLDL 18 04/05/2024 1036      Lab Results  Component Value Date   TSH 0.794 04/05/2024   TSH 2.280 01/01/2023   TSH 2.700 12/03/2021   TSH 0.635 09/08/2020   TSH 0.993 07/30/2020   TSH 1.990 07/23/2020   TSH 1.690 08/22/2010   FREET4 0.79 (L) 04/05/2024   FREET4 0.97 01/01/2023   FREET4 0.70 (L) 12/03/2021   FREET4 0.72 (L) 07/23/2020   FREET4 1.11 08/22/2010      Assessment & Plan:   1) Type 2 diabetes mellitus with hyperglycemia, without long-term current use of insulin  (HCC)  - Jaime [name] has currently uncontrolled symptomatic type 2 DM since  38 years of age.  She presents today with her CGM showing above target glycemic  profile overall.  Her POCT A1c today is 8.4%, increasing from last visit of 6.5%.  She notes she has moved to [facility], has gotten an emotional support dog since last visit.  She has been seen in the ED for various reasons ranging from UTI to esophagitis.  She has been on antibiotics.  She notes she is doing her best to eat right but with her recent multiple moves it has been difficulty.  She does express some depression to me today.  - I had a long discussion with her about the progressive nature of diabetes and the pathology behind its complications.  -her diabetes is complicated by obesity/sedentary life and she remains at a high risk for more acute and chronic complications which include CAD, CVA, CKD, retinopathy, and neuropathy. These are all discussed in detail with her.  - Nutritional counseling repeated at each appointment due to patients tendency to fall back in to old habits.  - The patient admits there is a room for improvement in their diet and drink choices. -  Suggestion is made for the patient to avoid simple carbohydrates from their diet including Cakes, Sweet Desserts / Pastries, Ice Cream, Soda (diet and regular), Sweet Tea, Candies, Chips, Cookies, Sweet Pastries, Store Bought Juices, Alcohol in Excess of 1-2 drinks a day, Artificial Sweeteners, Coffee Creamer, and Sugar-free Products. This will help patient to have stable blood glucose profile and potentially avoid unintended weight gain.   - I encouraged the patient to switch to unprocessed or minimally processed complex starch and increased protein intake (animal or plant source), fruits, and vegetables.   - Patient is advised to stick to a routine mealtimes to eat 3 meals a day and avoid unnecessary snacks (to snack only to correct hypoglycemia).  - I have approached her with the following individualized plan to manage  her diabetes and patient agrees:   -She is advised to continue her U500 80 units TID with meals if  glucose is above 90 and she is eating, and Mounjaro  7.5 mg SQ weekly.  I advised her to stop the Metformin  today due to cirrhosis of the liver and recent esophagitis.  I want her to be more consistent with eating and taking her medications as it looks like she has slacked some in recent weeks.  -She is encouraged to continue to monitor glucose 4 times daily (using her CGM or meter), before meals and before bed and log on the clinic sheets provided.  They are instructed to call the clinic if she has readings  less than 70 or greater than 300 for 3 tests in a row.     - Specific targets for  A1c;  LDL, HDL,  and Triglycerides were discussed with the patient.  2) Blood Pressure /Hypertension: Her blood pressure is controlled to target.  She is advised to continue Lasix  80 mg po daily, continue Lisinopril  5 mg po daily, continue Propanolol 10 mg po TID, and Aldactone  100 mg o daily  3) Lipids/Hyperlipidemia:   Review of her recent lipid profile from 12/01/23 shows controlled LDL at 91 and elevated triglycerides of 167 (slowly improving).  She is advised to continue Crestor  10 mg po daily at bedtime.  Side effects and precautions discussed with her.    4)  Weight/Diet:  Her Body mass index is 32.43 kg/m.  -   clearly complicating her diabetes care.   she is  a candidate for modest weight loss. I discussed with her the fact that loss of 5 - 10% of her  current body weight will have the most impact on her diabetes management.  Exercise, and detailed carbohydrates information provided  -  detailed on discharge instructions.  5) Vitamin D  deficiency Her most recent vitamin d  level on 12/03/21 was 9.8.  I discussed and initiated replenishment with Ergocalciferol  50000 units po weekly- she is still taking this.  6) Chronic Care/Health Maintenance: -she is on ACE and statin medications and is encouraged to initiate and continue to follow up with Ophthalmology, Dentist,  Podiatrist at least yearly or according to  recommendations, and advised to stay away from smoking. I have recommended yearly flu vaccine and pneumonia vaccine at least every 5 years; moderate intensity exercise for up to 150 minutes weekly; and  sleep for at least 7 hours a day.  - she is advised to maintain close follow up with Del Wilhelmena Lloyd Sola, FNP for primary care needs, as well as her other providers for optimal and coordinated care.  I did prescribe one time use of Diflucan  for her suspected yeast infection.  I also talked to her about quitting smoking/vaping and she asked about nicotine  patches.  I did send that in as well.   I spent  44  minutes in the care of the patient today including review of labs from CMP, Lipids, Thyroid Function, Hematology (current and previous including abstractions from other facilities); face-to-face time discussing  her blood glucose readings/logs, discussing hypoglycemia and hyperglycemia episodes and symptoms, medications doses, her options of short and long term treatment based on the latest standards of care / guidelines;  discussion about incorporating lifestyle medicine;  and documenting the encounter. Risk reduction counseling performed per USPSTF guidelines to reduce obesity and cardiovascular risk factors.     Please refer to Patient Instructions for Blood Glucose Monitoring and Insulin /Medications Dosing Guide  in media tab for additional information. Please  also refer to  Patient Self Inventory in the Media  tab for reviewed elements of pertinent patient history.  Jaime [name] participated in the discussions, expressed understanding, and voiced agreement with the above plans.  All questions were answered to her satisfaction. she is encouraged to contact clinic should she have any questions or concerns prior to her return visit.    Follow up plan: - Return in about 3 months (around 10/06/2024) for Diabetes F/U with A1c in office, No previsit labs, Bring meter and logs.   Benton Rio, [facility] [address] [place], [place] [address] Phone: [phone] Fax: [phone]  07/07/2024, 10:40 AM

## 2024-07-08 LAB — URINE CULTURE: Culture: 50000 — AB

## 2024-07-09 ENCOUNTER — Telehealth (HOSPITAL_BASED_OUTPATIENT_CLINIC_OR_DEPARTMENT_OTHER): Payer: Self-pay | Admitting: *Deleted

## 2024-07-09 NOTE — Telephone Encounter (Signed)
 Post ED Visit - Positive Culture Follow-up  Culture report reviewed by antimicrobial stewardship pharmacist: [facility] []  Rankin Dee, Pharm.D. []  Venetia Gully, Pharm.D., BCPS AQ-ID []  Garrel Crews, Pharm.D., BCPS []  Almarie Lunger, Pharm.D., BCPS []  [place], [place].D., BCPS, AAHIVP []  Rosaline Bihari, Pharm.D., BCPS, AAHIVP []  Vernell Meier, PharmD, BCPS []  Latanya Hint, PharmD, BCPS []  Donald Medley, PharmD, BCPS []  Rocky Bold, PharmD []  Dorothyann Alert, PharmD, BCPS []  Morene Babe, PharmD  [facility] []  Rosaline Edison, PharmD []  Romona Bliss, PharmD []  Dolphus Roller, PharmD []  Veva Seip, Rph []  Vernell Daunt) Leonce, PharmD []  Eva Allis, PharmD []  Rosaline Millet, PharmD []  Iantha Batch, PharmD []  Arvin Gauss, PharmD []  Wanda Hasting, PharmD []  Ronal Rav, PharmD []  Rocky Slade, PharmD [x]  Bard Jeans, PharmD   Positive urine culture 8/30 already addressed. No further action needed  Jaime [name] 07/09/2024, 12:14 PM

## 2024-07-10 ENCOUNTER — Telehealth: Payer: Self-pay | Admitting: Pharmacy Technician

## 2024-07-10 ENCOUNTER — Other Ambulatory Visit (HOSPITAL_COMMUNITY): Payer: Self-pay

## 2024-07-10 NOTE — Telephone Encounter (Signed)
 Clinical questions have been answered and PA submitted. PA currently Pending. Please be advised that most companies allow up to 30 days to make a decision. We will advise when a determination has been made, or follow up in 1 week.   Please reach out to our team, Rx Prior Auth Pool, if you haven't heard back in a week.

## 2024-07-10 NOTE — Telephone Encounter (Signed)
 Pharmacy Patient Advocate Encounter   Received notification from CoverMyMeds that prior authorization for FreeStyle Libre 3 Plus Sensor  is required/requested.   Insurance verification completed.   The patient is insured through [facility] MEDICAID .   Per test claim: PA required; PA started via CoverMyMeds. KEY [id number] . Waiting for clinical questions to populate.

## 2024-07-12 ENCOUNTER — Other Ambulatory Visit (HOSPITAL_COMMUNITY): Payer: Self-pay

## 2024-07-12 NOTE — Telephone Encounter (Signed)
 Pharmacy Patient Advocate Encounter  Received notification from [facility] MEDICAID that Prior Authorization for FreeStyle Libre 3 Plus Sensor  has been APPROVED from 07/11/24 to 07/11/25. Unable to obtain price due to refill too soon rejection, last fill date 06/28/24 next available fill date 07/26/24   PA #/Case ID/Reference #: [id number]

## 2024-07-13 ENCOUNTER — Telehealth: Payer: Self-pay | Admitting: *Deleted

## 2024-07-13 NOTE — Telephone Encounter (Signed)
 Patient was called and made aware that she had been approved for the [facility] 3 plus sensor.

## 2024-07-19 ENCOUNTER — Ambulatory Visit (HOSPITAL_COMMUNITY): Payer: MEDICAID

## 2024-07-24 ENCOUNTER — Other Ambulatory Visit: Payer: Self-pay

## 2024-07-24 DIAGNOSIS — L03116 Cellulitis of left lower limb: Secondary | ICD-10-CM

## 2024-07-26 DIAGNOSIS — Z8719 Personal history of other diseases of the digestive system: Secondary | ICD-10-CM | POA: Insufficient documentation

## 2024-07-26 DIAGNOSIS — I82532 Chronic embolism and thrombosis of left popliteal vein: Secondary | ICD-10-CM | POA: Insufficient documentation

## 2024-07-26 DIAGNOSIS — Z8619 Personal history of other infectious and parasitic diseases: Secondary | ICD-10-CM | POA: Insufficient documentation

## 2024-07-26 DIAGNOSIS — L0390 Cellulitis, unspecified: Secondary | ICD-10-CM | POA: Insufficient documentation

## 2024-07-28 ENCOUNTER — Other Ambulatory Visit (HOSPITAL_COMMUNITY): Payer: Self-pay

## 2024-07-28 ENCOUNTER — Other Ambulatory Visit: Payer: Self-pay

## 2024-07-28 MED ORDER — CEFPODOXIME PROXETIL 200 MG PO TABS
200.0000 mg | ORAL_TABLET | Freq: Two times a day (BID) | ORAL | 0 refills | Status: DC
  Filled 2024-07-28: qty 14, 7d supply, fill #0

## 2024-07-31 ENCOUNTER — Telehealth: Payer: Self-pay | Admitting: Nurse Practitioner

## 2024-07-31 ENCOUNTER — Other Ambulatory Visit: Payer: Self-pay

## 2024-07-31 NOTE — Telephone Encounter (Signed)
 Jaime [name] called and asked what you want her to do about what she called over the weekend for?

## 2024-07-31 NOTE — Telephone Encounter (Signed)
 I gave Tammy instructions, she will be calling her soon.

## 2024-07-31 NOTE — Telephone Encounter (Signed)
 Patient was called and made aware of Whitney's instruction. Patient is to restart  Mounjaro . Stay off Insulin  for now and Metformin . The Metformin  was D/Cd at the patient's last office visit.

## 2024-08-12 ENCOUNTER — Ambulatory Visit (HOSPITAL_COMMUNITY)
Admission: EM | Admit: 2024-08-12 | Discharge: 2024-08-12 | Disposition: A | Discharge status: Home / Self Care | Payer: MEDICAID

## 2024-08-12 ENCOUNTER — Other Ambulatory Visit: Payer: Self-pay

## 2024-08-12 ENCOUNTER — Encounter (HOSPITAL_COMMUNITY): Payer: Self-pay | Admitting: Emergency Medicine

## 2024-08-12 DIAGNOSIS — R519 Headache, unspecified: Secondary | ICD-10-CM | POA: Diagnosis not present

## 2024-08-12 MED ORDER — SUMATRIPTAN SUCCINATE 6 MG/0.5ML ~~LOC~~ SOLN
6.0000 mg | Freq: Once | SUBCUTANEOUS | Status: AC
  Administered 2024-08-12: 6 mg via SUBCUTANEOUS

## 2024-08-12 MED ORDER — ONDANSETRON 4 MG PO TBDP: 4.0000 mg | ORAL_TABLET | Freq: Three times a day (TID) | ORAL | 0 refills | Status: DC | PRN

## 2024-08-12 MED ORDER — ONDANSETRON 4 MG PO TBDP
4.0000 mg | ORAL_TABLET | Freq: Once | ORAL | Status: AC
  Administered 2024-08-12: 4 mg via ORAL

## 2024-08-12 MED ORDER — SUMATRIPTAN SUCCINATE 50 MG PO TABS: 50.0000 mg | ORAL_TABLET | ORAL | 0 refills | Status: DC | PRN

## 2024-08-12 MED ORDER — SUMATRIPTAN SUCCINATE 6 MG/0.5ML ~~LOC~~ SOLN
SUBCUTANEOUS | Status: AC
  Filled 2024-08-12: qty 0.5

## 2024-08-12 MED ORDER — ONDANSETRON 4 MG PO TBDP
ORAL_TABLET | ORAL | Status: AC
  Filled 2024-08-12: qty 1

## 2024-08-12 NOTE — Discharge Instructions (Signed)
  1. Acute intractable headache, unspecified headache type (Primary) - ondansetron  (ZOFRAN -ODT) disintegrating tablet 4 mg given in UC for acute nausea - SUMAtriptan  (IMITREX ) injection 6 mg given in UC for acute intractable headache. - ondansetron  (ZOFRAN -ODT) 4 MG disintegrating tablet; Take 1 tablet (4 mg total) by mouth every 8 (eight) hours as needed for nausea or vomiting.  Dispense: 20 tablet; Refill: 0 - SUMAtriptan  (IMITREX ) 50 MG tablet; Take 1 tablet (50 mg total) by mouth every 2 (two) hours as needed for migraine or headache. May repeat in 2 hours if headache persists or recurs.  Maximum of 200 mg per 24 hours.  Dispense: 10 tablet; Refill: 0 -Continue to monitor symptoms for any change in severity if there is any escalation of current symptoms or development of new symptoms follow-up in ER for further evaluation and management.

## 2024-08-12 NOTE — ED Triage Notes (Signed)
 Pt c/o headache since this morning with nausea and vomiting. Pt reports taking Tylenol  and Oxi IR that she has at home with no relief. Pt is AO x4 no neuro deficit noticed during assessment.

## 2024-08-12 NOTE — ED Provider Notes (Signed)
 [facility]   Note:  This document was prepared using Conservation officer, historic buildings and may include unintentional dictation errors.  MRN: [id number] DOB: 01-28-1986  Subjective:   Jaime [name] is a 38 y.o. female presenting for evaluation of migraine headache since this morning with nausea and vomiting.  Patient reports history of chronic migraines and has tried Tylenol  and Oxy IR at home with minimal improvement to symptoms.  Patient denies any blurred vision, fever, body aches, cough, nasal congestion, shortness of breath, chest pain, weakness, dizziness.    Current Facility-Administered Medications:    etonogestrel  (NEXPLANON ) implant 68 mg, 68 mg, Subdermal, Once, Ervin, Michael L, MD  Current Outpatient Medications:    ondansetron  (ZOFRAN -ODT) 4 MG disintegrating tablet, Take 1 tablet (4 mg total) by mouth every 8 (eight) hours as needed for nausea or vomiting., Disp: 20 tablet, Rfl: 0   SUMAtriptan  (IMITREX ) 50 MG tablet, Take 1 tablet (50 mg total) by mouth every 2 (two) hours as needed for migraine or headache. May repeat in 2 hours if headache persists or recurs.  Maximum of 200 mg per 24 hours., Disp: 10 tablet, Rfl: 0   acetaminophen  (TYLENOL ) 500 MG tablet, Take 1,000 mg by mouth 2 (two) times daily as needed for moderate pain (pain score 4-6) or headache., Disp: , Rfl:    albuterol  (VENTOLIN  HFA) 108 (90 Base) MCG/ACT inhaler, Inhale 2 puffs into the lungs every 6 (six) hours as needed for wheezing or shortness of breath., Disp: 8 g, Rfl: 0   apixaban  (ELIQUIS ) 5 MG TABS tablet, Take 2 tablets (10 mg total) by mouth 2 (two) times daily for 6 days, THEN 1 tablet (5 mg total) 2 (two) times daily., Disp: 60 tablet, Rfl: 4   ARIPiprazole  (ABILIFY ) 20 MG tablet, Take 1 tablet (20 mg total) by mouth daily., Disp: 30 tablet, Rfl: 3   betamethasone  dipropionate (DIPROLENE ) 0.05 % ointment, Apply topically 2 (two) times daily., Disp: 30 g, Rfl: 0   cefpodoxime   (VANTIN ) 200 MG tablet, Take 1 tablet (200 mg total) by mouth 2 (two) times daily., Disp: 14 tablet, Rfl: 0   chlorhexidine  (HIBICLENS ) 4 % external liquid, Apply topically daily as needed., Disp: 236 mL, Rfl: 0   clonazePAM  (KLONOPIN ) 0.5 MG tablet, Take 0.5 mg by mouth 2 (two) times daily as needed for anxiety., Disp: , Rfl:    Continuous Glucose Receiver (FREESTYLE LIBRE 3 READER) DEVI, Use the Freestyle Libre 3 Reader Device to monitor blood sugars as directed by the provider., Disp: 1 each, Rfl: 0   Continuous Glucose Sensor (FREESTYLE LIBRE 3 PLUS SENSOR) MISC, Change sensor every 15 days., Disp: 6 each, Rfl: 3   famotidine  (PEPCID ) 20 MG tablet, Take 1 tablet (20 mg total) by mouth at bedtime., Disp: , Rfl:    ferrous sulfate  325 (65 FE) MG EC tablet, Take 1 tablet (325 mg total) by mouth daily with breakfast., Disp: 90 tablet, Rfl: 3   glucose blood (ACCU-CHEK GUIDE) test strip, Use as instructed to check blood glucose four times daily, Disp: 150 each, Rfl: 5   hydrOXYzine  (ATARAX ) 10 MG tablet, Take 1 tablet (10 mg total) by mouth 3 (three) times daily as needed., Disp: 30 tablet, Rfl: 0   hydrOXYzine  (VISTARIL ) 50 MG capsule, Take 1 capsule (50 mg total) by mouth 3 (three) times daily as needed., Disp: 30 capsule, Rfl: 2   hyoscyamine  (ANASPAZ ) 0.125 MG TBDP disintergrating tablet, Place 1 tablet (0.125 mg total) under the tongue every 6 (six)  hours as needed., Disp: 180 tablet, Rfl: 1   Insulin  Pen Needle (PEN NEEDLES) 32G X 6 MM MISC, 1 each by Does not apply route 3 (three) times daily., Disp: 100 each, Rfl: 3   insulin  regular human CONCENTRATED (HUMULIN  R U-500 KWIKPEN) 500 UNIT/ML KwikPen, Inject 80 Units into the skin 3 (three) times daily with meals., Disp: 45 mL, Rfl: 3   lidocaine  (LIDODERM ) 5 %, Place 1 patch onto the skin daily. Remove & Discard patch within 12 hours or as directed by MD. DO not APPLY TO INFLAMED , RED SKIN, Disp: 30 patch, Rfl: 0   lidocaine  (XYLOCAINE ) 5 %  ointment, Apply 1 Application topically as needed. (Patient taking differently: Apply 1 Application topically 2 (two) times daily as needed (pain).), Disp: 35.44 g, Rfl: 5   lisinopril  (ZESTRIL ) 5 MG tablet, Take 5 mg by mouth daily., Disp: , Rfl:    methadone  (DOLOPHINE ) 10 MG/ML solution, Take 105 mg by mouth daily., Disp: , Rfl:    methocarbamol  (ROBAXIN ) 750 MG tablet, Take 1 tablet (750 mg total) by mouth 3 (three) times daily., Disp: 21 tablet, Rfl: 0   mupirocin  ointment (BACTROBAN ) 2 %, Apply 1 Application topically 2 (two) times daily., Disp: 60 g, Rfl: 0   NARCAN  4 MG/0.1ML LIQD nasal spray kit, Place 1 spray into the nose once as needed (OD)., Disp: , Rfl:    nicotine  (NICODERM CQ  - DOSED IN MG/24 HOURS) 14 mg/24hr patch, Place 1 patch (14 mg total) onto the skin daily., Disp: 28 patch, Rfl: 0   nystatin  cream (MYCOSTATIN ), Apply 1 Application topically 2 (two) times daily., Disp: 30 g, Rfl: 0   Omega-3 Fatty Acids (ENTERIC FISH OIL) 1000 MG CPDR, Take 1,000 mg by mouth 3 (three) times daily., Disp: , Rfl:    omeprazole  (PRILOSEC) 40 MG capsule, TAKE 1 CAPSULE BY MOUTH TWICE DAILY., Disp: 120 capsule, Rfl: 5   Oxycodone  HCl 10 MG TABS, Take 1 tablet (10 mg total) by mouth every 6 (six) hours as needed (pain)., Disp: 30 tablet, Rfl: 0   pantoprazole  (PROTONIX ) 20 MG tablet, Take 20 mg by mouth daily., Disp: , Rfl:    potassium chloride  (KLOR-CON ) 10 MEQ tablet, Take 10 mEq by mouth 2 (two) times daily., Disp: , Rfl:    propranolol  (INDERAL ) 10 MG tablet, Take 1 tablet (10 mg total) by mouth 2 (two) times daily., Disp: , Rfl:    silver  sulfADIAZINE  (SILVADENE ) 1 % cream, APPLY 1 APPLCATION TOPICALLY TWICE A DAY, Disp: 50 g, Rfl: 0   tirzepatide  (MOUNJARO ) 7.5 MG/0.5ML Pen, Inject 7.5 mg into the skin once a week., Disp: 6 mL, Rfl: 1   torsemide  (DEMADEX ) 20 MG tablet, Take 2 tablets (40 mg total) by mouth daily., Disp: , Rfl:    traZODone  (DESYREL ) 150 MG tablet, Take 1-2 tablets (150-300  mg total) by mouth at bedtime as needed for sleep., Disp: 60 tablet, Rfl: 0   venlafaxine  XR (EFFEXOR  XR) 150 MG 24 hr capsule, Take 1 capsule (150 mg total) by mouth daily with breakfast., Disp: 30 capsule, Rfl: 3   Allergies  Allergen Reactions   Firvanq  [Vancomycin ] Swelling    Facial swelling   Iodine -131 Anaphylaxis, Shortness Of Breath and Swelling   Ivp Dye [Iodinated Contrast Media] Anaphylaxis    Skin redness Difficulty walking   Toradol [Ketorolac Tromethamine] Shortness Of Breath    Flares ulcers   Tylenol  [Acetaminophen ] Other (See Comments)    Hx of cirrhosis    Nsaids  Other (See Comments)    Flares ulcers   Suboxone  [Buprenorphine Hcl-Naloxone  Hcl] Other (See Comments)    Withdrawal symptoms    Neurontin  [Gabapentin ] Itching    Past Medical History:  Diagnosis Date   Acute encephalopathy 08/31/2020   Adjustment disorder with depressed mood 09/07/2020   Anxiety    Asthma    Chronic abdominal pain    Chronic back pain    Chronic prescription opiate use 02/16/2022   Cirrhosis (HCC)    Cirrhosis (HCC)    COPD (chronic obstructive pulmonary disease) (HCC)    Depression    Diabetes mellitus without complication (HCC)    Diabetes mellitus, type II (HCC)    Diverticulosis    DVT (deep venous thrombosis) (HCC) 02/04/2023   taking lovenox  prior to procedure   Fatty liver 03/07/2020   GERD (gastroesophageal reflux disease)    Hepatitis C    Hernia, abdominal    Hyperglycemia due to type 2 diabetes mellitus (HCC) 05/24/2020   Hyperlipidemia    IBS (irritable bowel syndrome) 02/16/2022   Insomnia    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)    Lupus    Migraine headache    Morbid obesity (HCC) 05/24/2020   Neuropathy    Nocturnal seizures (HCC)    Pain of upper abdomen 07/28/2021   Peptic ulcer    Spleen enlarged    Splenomegaly      Past Surgical History:  Procedure Laterality Date   BIOPSY  11/25/2021   Procedure: BIOPSY;   Surgeon: Eartha Angelia Sieving, MD;  [facility];  Service: Gastroenterology;;   CHOLECYSTECTOMY     COLONOSCOPY WITH PROPOFOL  N/A 11/25/2021   Procedure: COLONOSCOPY WITH PROPOFOL ;  Surgeon: Eartha Angelia Sieving, MD;  Location: [facility];  Service: Gastroenterology;  Laterality: N/A;  805   COLONOSCOPY WITH PROPOFOL  N/A 03/31/2022   Procedure: COLONOSCOPY WITH PROPOFOL ;  Surgeon: Eartha Angelia Sieving, MD;  [facility]: [facility];  Service: Gastroenterology;  Laterality: N/A;  730   ESOPHAGOGASTRODUODENOSCOPY  06/2020   done at [facility], candida in upper esophagus (treated with diflucan ), ulcerative esophagitis at GE junction, gastritis in stomach, single ulcer in duodenal bulb, with duodenal mucosa showing no abnormality. No presence of varices   ESOPHAGOGASTRODUODENOSCOPY (EGD) WITH PROPOFOL  N/A 11/25/2021   Procedure: ESOPHAGOGASTRODUODENOSCOPY (EGD) WITH PROPOFOL ;  Surgeon: Eartha Angelia Sieving, MD;  [facility];  Service: Gastroenterology;  Laterality: N/A;   ESOPHAGOGASTRODUODENOSCOPY (EGD) WITH PROPOFOL  N/A 03/31/2022   Procedure: ESOPHAGOGASTRODUODENOSCOPY (EGD) WITH PROPOFOL ;  Surgeon: Eartha Angelia Sieving, MD;  [facility];  Service: Gastroenterology;  Laterality: N/A;   IVC FILTER REMOVAL N/A 09/16/2023   Procedure: IVC FILTER REMOVAL;  Surgeon: Pearline Norman RAMAN, MD;  Location: [facility] INVAS[facility] CV LAB;  Service: Cardiovascular;  Laterality: N/A;   IVC FILTER REMOVAL N/A 02/14/2024   Procedure: IVC FILTER REMOVAL;  Surgeon: Pearline Norman RAMAN, MD;  Location: [facility] INVASIVE CV LAB;  Service: Cardiovascular;  Laterality: N/A;   PERIPHERAL VASCULAR THROMBECTOMY Left 09/16/2023   Procedure: PERIPHERAL VASCULAR THROMBECTOMY;  Surgeon: Pearline Norman RAMAN, MD;  Location: [facility] INVASIVE CV LAB;  Service: Cardiovascular;  Laterality: Left;    Family History  Problem Relation Age of Onset   Hyperlipidemia Maternal Grandfather     Social History    Tobacco Use   Smoking status: Every Day    Current packs/day: 0.50    Types: Cigarettes    Passive exposure: Current   Smokeless tobacco: Never  Vaping Use  Vaping status: Every Day   Substances: Nicotine , Flavoring  Substance Use Topics   Alcohol use: No   Drug use: No    ROS Refer to HPI for ROS details.  Objective:    Vitals: BP 121/67 (BP Location: Right Arm)   Pulse 92   Temp 98.2 F (36.8 C) (Oral)   Resp 18   SpO2 98%   Physical Exam Vitals and nursing note reviewed.  Constitutional:      General: She is not in acute distress.    Appearance: She is well-developed. She is not ill-appearing or toxic-appearing.  HENT:     Head: Normocephalic and atraumatic.     Nose: Nose normal.     Mouth/Throat:     Mouth: Mucous membranes are moist.     Pharynx: Oropharynx is clear.  Eyes:     General:        Right eye: No discharge.        Left eye: No discharge.     Extraocular Movements: Extraocular movements intact.     Conjunctiva/sclera: Conjunctivae normal.  Cardiovascular:     Rate and Rhythm: Normal rate.  Pulmonary:     Effort: Pulmonary effort is normal. No respiratory distress.  Musculoskeletal:        General: Normal range of motion.  Skin:    General: Skin is warm and dry.  Neurological:     General: No focal deficit present.     Mental Status: She is alert and oriented to person, place, and time.  Psychiatric:        Mood and Affect: Mood normal.        Behavior: Behavior normal.     Procedures  No results found for this or any previous visit (from the past 24 hours).  Assessment and Plan :     Discharge Instructions       1. Acute intractable headache, unspecified headache type (Primary) - ondansetron  (ZOFRAN -ODT) disintegrating tablet 4 mg given in UC for acute nausea - SUMAtriptan  (IMITREX ) injection 6 mg given in UC for acute intractable headache. - ondansetron  (ZOFRAN -ODT) 4 MG disintegrating tablet; Take 1 tablet (4 mg  total) by mouth every 8 (eight) hours as needed for nausea or vomiting.  Dispense: 20 tablet; Refill: 0 - SUMAtriptan  (IMITREX ) 50 MG tablet; Take 1 tablet (50 mg total) by mouth every 2 (two) hours as needed for migraine or headache. May repeat in 2 hours if headache persists or recurs.  Maximum of 200 mg per 24 hours.  Dispense: 10 tablet; Refill: 0 -Continue to monitor symptoms for any change in severity if there is any escalation of current symptoms or development of new symptoms follow-up in ER for further evaluation and management.      Abreanna Drawdy B Londell Noll   Alisson Rozell, [place], [place] 08/12/24 [phone]

## 2024-08-14 NOTE — Therapy (Incomplete)
 OUTPATIENT PHYSICAL THERAPY THORACOLUMBAR EVALUATION   Patient Name: Jaime [name] MRN: [id number] DOB:[birth year], 38 y.o., female Today's Date: 08/15/2024  END OF SESSION:  PT End of Session - 08/15/24 1331     Visit Number 1    Number of Visits 8    Date for Recertification  09/12/24    Authorization Type [employer]    Authorization Time Period seeking auth please check    PT Start Time 1330    PT Stop Time 1410    PT Time Calculation (min) 40 min    Activity Tolerance Patient tolerated treatment well    Behavior During Therapy [facility] for tasks assessed/performed          Past Medical History:  Diagnosis Date   Acute encephalopathy 08/31/2020   Adjustment disorder with depressed mood 09/07/2020   Anxiety    Asthma    Chronic abdominal pain    Chronic back pain    Chronic prescription opiate use 02/16/2022   Cirrhosis (HCC)    Cirrhosis (HCC)    COPD (chronic obstructive pulmonary disease) (HCC)    Depression    Diabetes mellitus without complication (HCC)    Diabetes mellitus, type II (HCC)    Diverticulosis    DVT (deep venous thrombosis) (HCC) 02/04/2023   taking lovenox  prior to procedure   Fatty liver 03/07/2020   GERD (gastroesophageal reflux disease)    Hepatitis C    Hernia, abdominal    Hyperglycemia due to type 2 diabetes mellitus (HCC) 05/24/2020   Hyperlipidemia    IBS (irritable bowel syndrome) 02/16/2022   Insomnia    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)    Lupus    Migraine headache    Morbid obesity (HCC) 05/24/2020   Neuropathy    Nocturnal seizures (HCC)    Pain of upper abdomen 07/28/2021   Peptic ulcer    Spleen enlarged    Splenomegaly    Past Surgical History:  Procedure Laterality Date   BIOPSY  11/25/2021   Procedure: BIOPSY;  Surgeon: Eartha Angelia Sieving, MD;  [facility];  Service: Gastroenterology;;   CHOLECYSTECTOMY     COLONOSCOPY WITH PROPOFOL  N/A 11/25/2021    Procedure: COLONOSCOPY WITH PROPOFOL ;  Surgeon: Eartha Angelia Sieving, MD;  [facility];  Service: Gastroenterology;  Laterality: N/A;  805   COLONOSCOPY WITH PROPOFOL  N/A 03/31/2022   Procedure: COLONOSCOPY WITH PROPOFOL ;  Surgeon: Eartha Angelia Sieving, MD;  [facility];  Service: Gastroenterology;  Laterality: N/A;  730   ESOPHAGOGASTRODUODENOSCOPY  06/2020   done at [facility], candida in upper esophagus (treated with diflucan ), ulcerative esophagitis at GE junction, gastritis in stomach, single ulcer in duodenal bulb, with duodenal mucosa showing no abnormality. No presence of varices   ESOPHAGOGASTRODUODENOSCOPY (EGD) WITH PROPOFOL  N/A 11/25/2021   Procedure: ESOPHAGOGASTRODUODENOSCOPY (EGD) WITH PROPOFOL ;  Surgeon: Eartha Angelia Sieving, MD;  [facility];  Service: Gastroenterology;  Laterality: N/A;   ESOPHAGOGASTRODUODENOSCOPY (EGD) WITH PROPOFOL  N/A 03/31/2022   Procedure: ESOPHAGOGASTRODUODENOSCOPY (EGD) WITH PROPOFOL ;  Surgeon: Eartha Angelia Sieving, MD;  [facility];  Service: Gastroenterology;  Laterality: N/A;   IVC FILTER REMOVAL N/A 09/16/2023   Procedure: IVC FILTER REMOVAL;  Surgeon: Pearline Norman RAMAN, MD;  Location: [facility] INVASIVE CV LAB;  Service: Cardiovascular;  Laterality: N/A;   IVC FILTER REMOVAL N/A 02/14/2024   Procedure: IVC FILTER REMOVAL;  Surgeon: Pearline Norman RAMAN, MD;  Location: [facility] INVASIVE CV LAB;  Service: Cardiovascular;  Laterality: N/A;   PERIPHERAL VASCULAR THROMBECTOMY Left 09/16/2023   Procedure: PERIPHERAL VASCULAR THROMBECTOMY;  Surgeon: Pearline Norman RAMAN, MD;  Location: [facility] INVASIVE CV LAB;  Service: Cardiovascular;  Laterality: Left;   Patient Active Problem List   Diagnosis Date Noted   At risk for sleep apnea 05/17/2024   Left leg pain 05/15/2024   Encounter for examination following treatment at hospital 05/15/2024   Hx of bipolar disorder 04/05/2024   Lumbar pain 12/09/2023   Cellulitis and  abscess of buttock 10/22/2023   Type 2 diabetes mellitus with other specified complication (HCC) 10/22/2023   Encounter for IUD insertion 10/06/2023   Hospital discharge follow-up 10/05/2023   Depression, major, single episode, severe (HCC) 10/05/2023   Acute pulmonary embolism (HCC) 09/25/2023   Elevated d-dimer 09/16/2023   Elevated brain natriuretic peptide (BNP) level 09/16/2023   Hypoalbuminemia due to protein-calorie malnutrition 09/16/2023   Acute deep vein thrombosis (DVT) of left lower extremity (HCC) 09/15/2023   Left leg cellulitis 09/14/2023   Deep vein thrombophlebitis of left leg (HCC) 08/26/2023   Lumbar compression fracture (HCC) 02/10/2023   Left leg DVT (HCC) 02/05/2023   At risk for arrhythmia 01/26/2023   Chronic back pain 01/01/2023   Insomnia 01/01/2023   Depression, recurrent 01/01/2023   Constipation 05/18/2022   IBS (irritable bowel syndrome) 02/16/2022   Chronic prescription opiate use 02/16/2022   Gastroesophageal reflux disease 10/23/2021   Nausea and vomiting 10/23/2021   Diarrhea 10/23/2021   Pain of upper abdomen 07/28/2021   Adjustment disorder with depressed mood 09/07/2020   Dyspnea 07/29/2020   Anasarca 05/24/2020   Hyperglycemia due to type 2 diabetes mellitus (HCC) 05/24/2020   Morbid obesity (HCC) 05/24/2020   Elevated serum immunoglobulin free light chains 03/08/2020   Fatty liver 03/07/2020   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   Splenomegaly 03/07/2020   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   Pressure injury of skin 01/29/2019   Uncontrolled diabetes mellitus 09/19/2018   Hyperglycemia 09/17/2018   Acute respiratory failure with hypoxia (HCC) 09/15/2018   Anxiety 09/15/2018   Chronic abdominal pain 09/15/2018   Diabetes mellitus without complication (HCC) 09/15/2018   Hepatitis C 09/15/2018   Peptic ulcer 09/15/2018   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) 09/15/2018   Cirrhosis (HCC) 09/15/2018    Tobacco use disorder 05/22/2018   CAP (community acquired pneumonia) 07/17/2016   Chronic pain disorder 07/17/2016   Depression 05/24/2013    PCP: Terry Wilhelmena Lloyd Hilario, FNP  REFERRING PROVIDER: Terry Wilhelmena Lloyd Hilario, FNP  REFERRING DIAG: [phone] (ICD-10-CM) - Degeneration of intervertebral disc of lumbar region with discogenic back pain and lower extremity pain  Rationale for Evaluation and Treatment: Rehabilitation  THERAPY DIAG:  Other low back pain  Other abnormalities of gait and mobility  ONSET DATE: 10 years ago  SUBJECTIVE:  SUBJECTIVE STATEMENT: Back pain ongoing for 10 years; Patient well known to this clinic; latest visit back in May; stopped therapy due to moving.  Here today for the same back pain.  Reports incontinence is resolved.  Pain is in low back and legs down to toes; left leg worse than right.  Reports pain is in front and back of her legs.    PERTINENT HISTORY:  Depression, anxiety  PAIN:  Are you having pain? Yes: NPRS scale: 9/10 Pain location: low back and legs Pain description: aching Aggravating factors: bending over, walking Relieving factors: propping feet up; laying down  PRECAUTIONS: None  RED FLAGS: None   WEIGHT BEARING RESTRICTIONS: No  FALLS:  Has patient fallen in last 6 months? No   OCCUPATION: clerk at a gas [employer]  PLOF: Independent  PATIENT GOALS: relieve some pain  NEXT MD VISIT: PRN  OBJECTIVE:  Note: Objective measures were completed at Evaluation unless otherwise noted.  DIAGNOSTIC FINDINGS:  CLINICAL DATA:  Back pain   EXAM: LUMBAR SPINE - 2-3 VIEW   COMPARISON:  CT 01/07/2023   FINDINGS: Lumbar alignment within normal limits. Vertebral body heights are maintained. Mild disc space narrowing and degenerative change  L1-L2, L2-L3 and L4-L5.   IMPRESSION: Mild degenerative changes.     Electronically Signed   By: Luke Bun M.D.   On: 01/27/2023 23:57    PATIENT SURVEYS:  Modified Oswestry:  MODIFIED OSWESTRY DISABILITY SCALE  Date: 08/15/24 Score                                Total 36/50; 72%   Interpretation of scores: Score Category Description  0-20% Minimal Disability The patient can cope with most living activities. Usually no treatment is indicated apart from advice on lifting, sitting and exercise  21-40% Moderate Disability The patient experiences more pain and difficulty with sitting, lifting and standing. Travel and social life are more difficult and they may be disabled from work. Personal care, sexual activity and sleeping are not grossly affected, and the patient can usually be managed by conservative means  41-60% Severe Disability Pain remains the main problem in this group, but activities of daily living are affected. These patients require a detailed investigation  61-80% Crippled Back pain impinges on all aspects of the patient's life. Positive intervention is required  81-100% Bed-bound These patients are either bed-bound or exaggerating their symptoms  Bluford FORBES Zoe DELENA Karon DELENA, et al. Surgery versus conservative management of stable thoracolumbar fracture: the PRESTO feasibility RCT. [place]): [employer]; 2021 Nov. [facility] Technology Assessment, No. 25.62.) Appendix 3, Oswestry Disability Index category descriptors. Available from: FindJewel[employer].cz  Minimally Clinically Important Difference (MCID) = 12.8%  COGNITION: Overall cognitive status: Within functional limits for tasks assessed     SENSATION: Neuropathy In feet likely from DM  MUSCLE LENGTH: Hamstrings: bilateral tightness  POSTURE: rounded shoulders, forward head, decreased lumbar lordosis, and increased thoracic kyphosis  PALPATION: Tender  bilateral paraspinals lumbar spine  LUMBAR ROM:   AROM eval  Flexion *painful; fingertips to mid shin  Extension 20% available  Right lateral flexion Fingertips 4 above knee joint line  Left lateral flexion Fingertips 3 above knee joint line  Right rotation   Left rotation    (Blank rows = not tested)  LOWER EXTREMITY ROM:     Active  Right eval Left eval  Hip flexion    Hip extension  Hip abduction    Hip adduction    Hip internal rotation    Hip external rotation    Knee flexion    Knee extension    Ankle dorsiflexion    Ankle plantarflexion    Ankle inversion    Ankle eversion     (Blank rows = not tested)  LOWER EXTREMITY MMT:    MMT Right eval Left eval  Hip flexion 4+ 4+  Hip extension 4 4  Hip abduction 4+ 4+  Hip adduction    Hip internal rotation    Hip external rotation    Knee flexion 4+ 4+  Knee extension 4+ 4+  Ankle dorsiflexion 4+ 4+  Ankle plantarflexion    Ankle inversion    Ankle eversion     (Blank rows = not tested)   FUNCTIONAL TESTS:  5 times sit to stand: 14.33 sec using hands to push up to standing SLS next visit  GAIT: Distance walked: 60 ft in clinic Assistive device utilized: None Level of assistance: Modified independence Comments: slightly decreased gait speed  TREATMENT DATE: 08/15/24 physical therapy evaluation and HEP instruction                                                                                                                                 PATIENT EDUCATION:  Education details: Patient educated on exam findings, POC, scope of PT, HEP, and information (phone number and address) to [address] clinic. Person educated: Patient Education method: Explanation, Demonstration, and Handouts Education comprehension: verbalized understanding, returned demonstration, verbal cues required, and tactile cues required   HOME EXERCISE PROGRAM: Access Code: [email or web address] URL:  [email or web address] Date: 08/15/2024 Prepared by: [facility]  Exercises - Supine Transversus Abdominis Bracing - Hands on Stomach  - 2 x daily - 7 x weekly - 1 sets - 10 reps - 5 sec hold - Supine Hamstring Stretch  - 2 x daily - 7 x weekly - 1 sets - 5 reps - 20 sec hold - Supine Bridge  - 2 x daily - 7 x weekly - 1 sets - 10 reps - 5 sec hold  ASSESSMENT:  CLINICAL IMPRESSION: Patient is a 38 y.o. female  who was seen today for physical therapy evaluation and treatment for M51.362 (ICD-10-CM) - Degeneration of intervertebral disc of lumbar region with discogenic back pain and lower extremity pain. Patient demonstrates muscle weakness, reduced ROM, and fascial restrictions which are likely contributing to symptoms of pain and are negatively impacting patient ability to perform ADLs and functional mobility tasks. Patient will benefit from skilled physical therapy services to address these deficits to reduce pain and improve level of function with ADLs and functional mobility tasks.   OBJECTIVE IMPAIRMENTS: Abnormal gait, decreased mobility, decreased strength, increased fascial restrictions, impaired perceived functional ability, and pain.   ACTIVITY LIMITATIONS: carrying, lifting, bending, sitting, standing, squatting, sleeping, stairs, bed mobility, and locomotion level  PARTICIPATION LIMITATIONS: meal prep, cleaning,  laundry, shopping, community activity, and occupation  PERSONAL FACTORS: Time since onset of injury/illness/exacerbation are also affecting patient's functional outcome.   REHAB POTENTIAL: Good  CLINICAL DECISION MAKING: Evolving/moderate complexity  EVALUATION COMPLEXITY: Moderate   GOALS: Goals reviewed with patient? No  SHORT TERM GOALS: Target date: 08/29/2024  patient will be independent with initial HEP  Baseline: Goal status: INITIAL  2.  Patient will report 30% improvement overall  Baseline:  Goal status: INITIAL   LONG TERM GOALS:  Target date: 09/12/2024  Patient will be independent in self management strategies to improve quality of life and functional outcomes.  Baseline:  Goal status: INITIAL  2.  Patient will report 50% improvement overall  Baseline:  Goal status: INITIAL  3.  Patient will improve Modified Oswestry score by 10 (26/50 or less) points to demonstrate improved perceived function  Baseline: 36/50 Goal status: INITIAL  4.   Patient will increase bilateral leg MMT's  hip extension to 4+ to 5/5 without pain to allow navigation of steps without gait deviation or loss of balance  Baseline: see above Goal status: INITIAL  5.  Patient will improve 5 times sit to stand score to 11 sec or less to demonstrate improved functional mobility and increased leg strength.    Baseline: 14.33 sec Goal status: INITIAL  PLAN:  PT FREQUENCY: 2x/week  PT DURATION: 4 weeks  PLANNED INTERVENTIONS: [id number]- PT Re-evaluation, 97110-Therapeutic exercises, 97530- Therapeutic activity, 97112- Neuromuscular re-education, 97535- Self Care, [id number]- Manual therapy, [id number]- Gait training, [phone]- Orthotic Fit/training, [phone]- Canalith repositioning, [id number]- Aquatic Therapy, [phone]- Splinting, [phone]- Wound care (first 20 sq cm), 97598- Wound care (each additional 20 sq cm)Patient/Family education, Balance training, Stair training, Taping, Dry Needling, Joint mobilization, Joint manipulation, Spinal manipulation, Spinal mobilization, Scar mobilization, and DME instructions. SABRA  PLAN FOR NEXT SESSION: Review HEP and goals; core and postural strengthening; patient states she now lives in [place] and would like her follow up appointments there.  Myrick will reach out to the [address] office and also gave patient contact information for the clinic.    2:07 PM, 08/15/24 Jaime [name] Jaime [name] MPT [facility] [place] [phone] [phone]

## 2024-08-15 ENCOUNTER — Other Ambulatory Visit: Payer: Self-pay

## 2024-08-15 ENCOUNTER — Ambulatory Visit (HOSPITAL_COMMUNITY): Payer: MEDICAID | Attending: Family Medicine

## 2024-08-15 DIAGNOSIS — M51362 Other intervertebral disc degeneration, lumbar region with discogenic back pain and lower extremity pain: Secondary | ICD-10-CM | POA: Insufficient documentation

## 2024-08-15 DIAGNOSIS — M5459 Other low back pain: Secondary | ICD-10-CM | POA: Insufficient documentation

## 2024-08-15 DIAGNOSIS — R2689 Other abnormalities of gait and mobility: Secondary | ICD-10-CM | POA: Insufficient documentation

## 2024-08-16 ENCOUNTER — Other Ambulatory Visit: Payer: Self-pay | Admitting: Nurse Practitioner

## 2024-08-16 ENCOUNTER — Encounter (INDEPENDENT_AMBULATORY_CARE_PROVIDER_SITE_OTHER): Payer: Self-pay | Admitting: Gastroenterology

## 2024-08-17 ENCOUNTER — Ambulatory Visit: Payer: MEDICAID

## 2024-08-17 ENCOUNTER — Ambulatory Visit
Admission: EM | Admit: 2024-08-17 | Discharge: 2024-08-17 | Disposition: A | Discharge status: Home / Self Care | Payer: MEDICAID | Attending: Family Medicine | Admitting: Family Medicine

## 2024-08-17 ENCOUNTER — Encounter: Payer: Self-pay | Admitting: Emergency Medicine

## 2024-08-17 ENCOUNTER — Other Ambulatory Visit: Payer: Self-pay

## 2024-08-17 DIAGNOSIS — N3090 Cystitis, unspecified without hematuria: Secondary | ICD-10-CM | POA: Diagnosis present

## 2024-08-17 DIAGNOSIS — Z23 Encounter for immunization: Secondary | ICD-10-CM

## 2024-08-17 LAB — POCT URINE DIPSTICK
Bilirubin, UA: NEGATIVE
Glucose, UA: 500 mg/dL — AB
Ketones, POC UA: NEGATIVE mg/dL
Leukocytes, UA: NEGATIVE
Nitrite, UA: NEGATIVE
Protein Ur, POC: NEGATIVE mg/dL
Spec Grav, UA: 1.015 (ref 1.010–1.025)
Urobilinogen, UA: 0.2 U/dL
pH, UA: 7 (ref 5.0–8.0)

## 2024-08-17 LAB — POCT URINE PREGNANCY: Preg Test, Ur: NEGATIVE

## 2024-08-17 MED ORDER — NITROFURANTOIN MONOHYD MACRO 100 MG PO CAPS: 100.0000 mg | ORAL_CAPSULE | Freq: Two times a day (BID) | ORAL | 0 refills | Status: AC

## 2024-08-17 NOTE — ED Provider Notes (Signed)
 [facility]    CSN: [id number] Arrival date & time: 08/17/24  1841      History   Chief Complaint Chief Complaint  Patient presents with   Dysuria   Urinary Frequency   Abdominal Pain    HPI Jaime [name] is a 38 y.o. female.    Dysuria Associated symptoms: abdominal pain   Urinary Frequency Associated symptoms include abdominal pain.  Abdominal Pain Associated symptoms: dysuria    Here for dysuria and urinary frequency that began 2 days ago.  She has maybe had some chills.  No vomiting but maybe some nausea.  She is allergic to vancomycin  and Toradol and Tylenol  and NSAIDs and Suboxone  and Neurontin .  She is not allergic to any antibiotics.  She has diabetes.  Last eGFR was normal  Her last few urine cultures have shown ESBL E. coli.  She last took Vantin  and September. Past Medical History:  Diagnosis Date   Acute encephalopathy 08/31/2020   Adjustment disorder with depressed mood 09/07/2020   Anxiety    Asthma    Chronic abdominal pain    Chronic back pain    Chronic prescription opiate use 02/16/2022   Cirrhosis (HCC)    Cirrhosis (HCC)    COPD (chronic obstructive pulmonary disease) (HCC)    Depression    Diabetes mellitus without complication (HCC)    Diabetes mellitus, type II (HCC)    Diverticulosis    DVT (deep venous thrombosis) (HCC) 02/04/2023   taking lovenox  prior to procedure   Fatty liver 03/07/2020   GERD (gastroesophageal reflux disease)    Hepatitis C    Hernia, abdominal    Hyperglycemia due to type 2 diabetes mellitus (HCC) 05/24/2020   Hyperlipidemia    IBS (irritable bowel syndrome) 02/16/2022   Insomnia    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)    Lupus    Migraine headache    Morbid obesity (HCC) 05/24/2020   Neuropathy    Nocturnal seizures (HCC)    Pain of upper abdomen 07/28/2021   Peptic ulcer    Spleen enlarged    Splenomegaly     Patient Active Problem List   Diagnosis  Date Noted   Cellulitis 07/26/2024   Chronic deep vein thrombosis (DVT) of popliteal vein of left lower extremity (HCC) 07/26/2024   History of hepatitis C 07/26/2024   History of IBS 07/26/2024   Hypomagnesemia 07/26/2024   At risk for sleep apnea 05/17/2024   Left leg pain 05/15/2024   Encounter for examination following treatment at hospital 05/15/2024   Hx of bipolar disorder 04/05/2024   Lumbar pain 12/09/2023   Cellulitis and abscess of buttock 10/22/2023   Type 2 diabetes mellitus with other specified complication (HCC) 10/22/2023   Encounter for IUD insertion 10/06/2023   Hospital discharge follow-up 10/05/2023   Depression, major, single episode, severe (HCC) 10/05/2023   Acute pulmonary embolism (HCC) 09/25/2023   Elevated d-dimer 09/16/2023   Elevated brain natriuretic peptide (BNP) level 09/16/2023   Hypoalbuminemia due to protein-calorie malnutrition 09/16/2023   Acute deep vein thrombosis (DVT) of left lower extremity (HCC) 09/15/2023   Cellulitis of left lower extremity 09/14/2023   Deep vein thrombophlebitis of left leg (HCC) 08/26/2023   Lumbar compression fracture (HCC) 02/10/2023   Left leg DVT (HCC) 02/05/2023   At risk for arrhythmia 01/26/2023   Chronic back pain 01/01/2023   Insomnia 01/01/2023   Depression, recurrent 01/01/2023   Constipation 05/18/2022   IBS (irritable bowel syndrome)  02/16/2022   Chronic prescription opiate use 02/16/2022   Gastroesophageal reflux disease without esophagitis 10/23/2021   Nausea and vomiting 10/23/2021   Diarrhea 10/23/2021   Pain of upper abdomen 07/28/2021   Adjustment disorder with depressed mood 09/07/2020   Dyspnea 07/29/2020   Anasarca 05/24/2020   Hyperglycemia due to type 2 diabetes mellitus (HCC) 05/24/2020   Morbid obesity (HCC) 05/24/2020   Elevated serum immunoglobulin free light chains 03/08/2020   Fatty liver 03/07/2020   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   Splenomegaly 03/07/2020   Thrombocytopenia  due to hypersplenism 03/07/2020   Pressure injury of skin 01/29/2019   Uncontrolled diabetes mellitus 09/19/2018   Hyperglycemia 09/17/2018   Acute respiratory failure with hypoxia (HCC) 09/15/2018   Anxiety 09/15/2018   Chronic abdominal pain 09/15/2018   Diabetes mellitus without complication (HCC) 09/15/2018   Hepatitis C 09/15/2018   Peptic ulcer 09/15/2018   Cirrhosis (HCC) 09/15/2018   Tobacco use disorder 05/22/2018   Opioid use disorder 05/02/2018   Type 2 diabetes mellitus with diabetic neuropathy, with long-term current use of insulin  (HCC) 05/02/2018   Mood disorder 05/02/2018   CAP (community acquired pneumonia) 07/17/2016   Chronic pain disorder 07/17/2016   Depression 05/24/2013    Past Surgical History:  Procedure Laterality Date   BIOPSY  11/25/2021   Procedure: BIOPSY;  Surgeon: Eartha Angelia Sieving, MD;  Location: [facility];  Service: Gastroenterology;;   CHOLECYSTECTOMY     COLONOSCOPY WITH PROPOFOL  N/A 11/25/2021   Procedure: COLONOSCOPY WITH PROPOFOL ;  Surgeon: Eartha Angelia Sieving, MD;  Location: [facility];  Service: Gastroenterology;  Laterality: N/A;  805   COLONOSCOPY WITH PROPOFOL  N/A 03/31/2022   Procedure: COLONOSCOPY WITH PROPOFOL ;  Surgeon: Eartha Angelia Sieving, MD;  Location: [facility];  Service: Gastroenterology;  Laterality: N/A;  730   ESOPHAGOGASTRODUODENOSCOPY  06/2020   done at [facility], candida in upper esophagus (treated with diflucan ), ulcerative esophagitis at GE junction, gastritis in stomach, single ulcer in duodenal bulb, with duodenal mucosa showing no abnormality. No presence of varices   ESOPHAGOGASTRODUODENOSCOPY (EGD) WITH PROPOFOL  N/A 11/25/2021   Procedure: ESOPHAGOGASTRODUODENOSCOPY (EGD) WITH PROPOFOL ;  Surgeon: Eartha Angelia Sieving, MD;  Location: [facility];  Service: Gastroenterology;  Laterality: N/A;   ESOPHAGOGASTRODUODENOSCOPY (EGD) WITH PROPOFOL  N/A 03/31/2022   Procedure:  ESOPHAGOGASTRODUODENOSCOPY (EGD) WITH PROPOFOL ;  Surgeon: Eartha Angelia Sieving, MD;  Location: [facility];  Service: Gastroenterology;  Laterality: N/A;   IVC FILTER REMOVAL N/A 09/16/2023   Procedure: IVC FILTER REMOVAL;  Surgeon: Pearline Norman RAMAN, MD;  Location: [facility] INVASIVE CV LAB;  Service: Cardiovascular;  Laterality: N/A;   IVC FILTER REMOVAL N/A 02/14/2024   Procedure: IVC FILTER REMOVAL;  Surgeon: Pearline Norman RAMAN, MD;  Location: [facility] INVASIVE CV LAB;  Service: Cardiovascular;  Laterality: N/A;   PERIPHERAL VASCULAR THROMBECTOMY Left 09/16/2023   Procedure: PERIPHERAL VASCULAR THROMBECTOMY;  Surgeon: Pearline Norman RAMAN, MD;  [facility] INVASIVE CV LAB;  Service: Cardiovascular;  Laterality: Left;    OB History     Gravida  0   Para  0   Term  0   Preterm  0   AB  0   Living  0      SAB  0   IAB  0   Ectopic  0   Multiple  0   Live Births  0            Home Medications    Prior to Admission medications   Medication Sig Start Date End  Date Taking? Authorizing Provider  acetaminophen  (TYLENOL ) 500 MG tablet Take 1,000 mg by mouth 2 (two) times daily as needed for moderate pain (pain score 4-6) or headache.   Yes [provider]  apixaban  (ELIQUIS ) 5 MG TABS tablet Take 2 tablets (10 mg total) by mouth 2 (two) times daily for 6 days, THEN 1 tablet (5 mg total) 2 (two) times daily. 09/28/23 08/17/24 Yes Ricky Fines, MD  ARIPiprazole  (ABILIFY ) 20 MG tablet Take 1 tablet (20 mg total) by mouth daily. 05/31/24  Yes Bevely Doffing, FNP  aspirin  81 MG chewable tablet Chew 81 mg by mouth. 06/12/20  Yes [provider]  betamethasone  dipropionate (DIPROLENE ) 0.05 % ointment Apply topically 2 (two) times daily. 07/05/24  Yes Barrett, Jamie N, PA-C  buPROPion (WELLBUTRIN XL) 150 MG 24 hr tablet Take 150 mg by mouth. 06/29/24  Yes [provider]  clonazePAM  (KLONOPIN ) 0.5 MG tablet Take 0.5 mg by mouth 2 (two) times daily as needed for  anxiety. 08/02/23  Yes [provider]  Continuous Glucose Sensor (FREESTYLE LIBRE 3 PLUS SENSOR) MISC Change sensor every 15 days. 07/07/24  Yes Therisa Benton PARAS, NP  DULoxetine  (CYMBALTA ) 60 MG capsule Take 60 mg by mouth daily. 07/22/24  Yes [provider]  famotidine  (PEPCID ) 20 MG tablet Take 1 tablet (20 mg total) by mouth at bedtime. 09/28/23  Yes Ricky Fines, MD  ferrous sulfate  325 (65 FE) MG EC tablet Take 1 tablet (325 mg total) by mouth daily with breakfast. 09/13/23  Yes Pennington, Rebekah M, PA-C  hydrOXYzine  (ATARAX ) 10 MG tablet Take 1 tablet (10 mg total) by mouth 3 (three) times daily as needed. 05/30/24  Yes Leath-Warren, Etta PARAS, NP  hyoscyamine  (ANASPAZ ) 0.125 MG TBDP disintergrating tablet Place 1 tablet (0.125 mg total) under the tongue every 6 (six) hours as needed. 12/13/23  Yes Eartha Angelia Sieving, MD  Insulin  Pen Needle (PEN NEEDLES) 32G X 6 MM MISC 1 each by Does not apply route 3 (three) times daily. 02/26/21  Yes Reardon, Benton PARAS, NP  insulin  regular human CONCENTRATED (HUMULIN  R U-500 KWIKPEN) 500 UNIT/ML KwikPen Inject 80 Units into the skin 3 (three) times daily with meals. 03/06/24  Yes Therisa Benton PARAS, NP  lisinopril  (ZESTRIL ) 5 MG tablet Take 5 mg by mouth daily.   Yes [provider]  methadone  (DOLOPHINE ) 10 MG/ML solution Take 105 mg by mouth daily.   Yes [provider]  methocarbamol  (ROBAXIN ) 750 MG tablet Take 1 tablet (750 mg total) by mouth 3 (three) times daily. 07/01/24  Yes Daralene Bruckner D, PA-C  MOUNJARO  7.5 MG/0.5ML Pen INJECT 1 PEN SUBCUTANEOUSLY ONCE A WEEK 08/17/24  Yes Therisa Benton PARAS, NP  mupirocin  ointment (BACTROBAN ) 2 % Apply 1 Application topically 2 (two) times daily. 05/13/24  Yes Stuart Vernell Norris, PA-C  nicotine  (NICODERM CQ  - DOSED IN MG/24 HOURS) 14 mg/24hr patch Place 1 patch (14 mg total) onto the skin daily. 07/07/24  Yes Reardon, Benton PARAS, NP  nitrofurantoin ,  macrocrystal-monohydrate, (MACROBID ) 100 MG capsule Take 1 capsule (100 mg total) by mouth 2 (two) times daily for 7 days. 08/17/24 08/24/24 Yes Alim Cattell, Sharlet POUR, MD  omeprazole  (PRILOSEC) 40 MG capsule TAKE 1 CAPSULE BY MOUTH TWICE DAILY. 05/10/23  Yes Carlan, Chelsea L, NP  ondansetron  (ZOFRAN -ODT) 4 MG disintegrating tablet Take 1 tablet (4 mg total) by mouth every 8 (eight) hours as needed for nausea or vomiting. 08/12/24  Yes Reddick, Johnathan B, NP  pantoprazole  (PROTONIX ) 20 MG  tablet Take 20 mg by mouth daily. 12/30/23  Yes [provider]  pregabalin  (LYRICA ) 75 MG capsule Take 75 mg by mouth. 07/28/24  Yes [provider]  propranolol  (INDERAL ) 10 MG tablet Take 1 tablet (10 mg total) by mouth 2 (two) times daily. 09/28/23  Yes Ricky Fines, MD  venlafaxine  XR (EFFEXOR  XR) 150 MG 24 hr capsule Take 1 capsule (150 mg total) by mouth daily with breakfast. 05/31/24  Yes Bevely Doffing, FNP  albuterol  (VENTOLIN  HFA) 108 (90 Base) MCG/ACT inhaler Inhale 2 puffs into the lungs every 6 (six) hours as needed for wheezing or shortness of breath. 07/17/22   Leath-Warren, Etta PARAS, NP  chlorhexidine  (HIBICLENS ) 4 % external liquid Apply topically daily as needed. Patient not taking: Reported on 08/17/2024 05/13/24   Stuart Vernell Norris, PA-C  Continuous Glucose Receiver (FREESTYLE LIBRE 3 READER) DEVI Use the [facility] Libre 3 Reader Device to monitor blood sugars as directed by the provider. 02/29/24   Therisa Benton PARAS, NP  fluconazole  (DIFLUCAN ) 150 MG tablet Take 150 mg by mouth once. Patient not taking: Reported on 08/17/2024 07/07/24   [provider]  glucose blood (ACCU-CHEK GUIDE) test strip Use as instructed to check blood glucose four times daily Patient not taking: Reported on 08/17/2024 12/01/21   Therisa Benton PARAS, NP  hydrOXYzine  (VISTARIL ) 50 MG capsule Take 1 capsule (50 mg total) by mouth 3 (three) times daily as needed. Patient not taking: Reported on  08/17/2024 05/25/23   Del Orbe Polanco, Iliana, FNP  lidocaine  (LIDODERM ) 5 % Place 1 patch onto the skin daily. Remove & Discard patch within 12 hours or as directed by MD. DO not APPLY TO INFLAMED , RED SKIN Patient not taking: Reported on 08/17/2024 04/12/24   Antonetta Rollene BRAVO, MD  lidocaine  (XYLOCAINE ) 5 % ointment Apply 1 Application topically as needed. Patient not taking: Reported on 08/17/2024 04/27/23   Del Wilhelmena Lloyd Sola, FNP  NARCAN  4 MG/0.1ML LIQD nasal spray kit Place 1 spray into the nose once as needed (OD). Patient not taking: Reported on 08/17/2024 04/07/24   [provider]  nystatin  cream (MYCOSTATIN ) Apply 1 Application topically 2 (two) times daily. Patient not taking: Reported on 08/17/2024 10/22/23   Antonetta Rollene BRAVO, MD  Omega-3 Fatty Acids (ENTERIC FISH OIL) 1000 MG CPDR Take 1,000 mg by mouth 3 (three) times daily. Patient not taking: Reported on 08/17/2024    [provider]  potassium chloride  (KLOR-CON ) 10 MEQ tablet Take 10 mEq by mouth 2 (two) times daily. Patient not taking: Reported on 08/17/2024 08/10/23   [provider]  silver  sulfADIAZINE  (SILVADENE ) 1 % cream APPLY 1 APPLCATION TOPICALLY TWICE A DAY Patient not taking: Reported on 08/17/2024 07/25/24   Del Orbe Polanco, Iliana, FNP  SUMAtriptan  (IMITREX ) 50 MG tablet Take 1 tablet (50 mg total) by mouth every 2 (two) hours as needed for migraine or headache. May repeat in 2 hours if headache persists or recurs.  Maximum of 200 mg per 24 hours. Patient not taking: Reported on 08/17/2024 08/12/24   Aurea Goodell B, NP  torsemide  (DEMADEX ) 20 MG tablet Take 2 tablets (40 mg total) by mouth daily. Patient not taking: Reported on 08/17/2024 09/28/23   Ricky Fines, MD  traZODone  (DESYREL ) 150 MG tablet Take 1-2 tablets (150-300 mg total) by mouth at bedtime as needed for sleep. Patient not taking: Reported on 08/17/2024 06/22/24 07/22/24  Jude Harden GAILS, MD  triamcinolone   cream (KENALOG ) 0.5 % Apply topically. Patient not  taking: Reported on 08/17/2024 07/28/24 08/27/24  [provider]    Family History Family History  Problem Relation Age of Onset   Hyperlipidemia Maternal Grandfather     Social History Social History   Tobacco Use   Smoking status: Every Day    Current packs/day: 0.50    Types: Cigarettes    Passive exposure: Current   Smokeless tobacco: Never  Vaping Use   Vaping status: Some Days   Substances: Nicotine , Flavoring  Substance Use Topics   Alcohol use: No   Drug use: No     Allergies   Firvanq  [vancomycin ], Iodine -131, Ivp dye [iodinated contrast media], Toradol [ketorolac tromethamine], Tylenol  [acetaminophen ], Nsaids, Suboxone  [buprenorphine hcl-naloxone  hcl], and Neurontin  [gabapentin ]   Review of Systems Review of Systems  Gastrointestinal:  Positive for abdominal pain.  Genitourinary:  Positive for dysuria and frequency.     Physical Exam Triage Vital Signs ED Triage Vitals [08/17/24 1907]  Encounter Vitals Group     BP 111/75     Girls Systolic BP Percentile      Girls Diastolic BP Percentile      Boys Systolic BP Percentile      Boys Diastolic BP Percentile      Pulse Rate 96     Resp 18     Temp 98.2 F (36.8 C)     Temp Source Oral     SpO2 96 %     Weight      Height      Head Circumference      Peak Flow      Pain Score 9     Pain Loc      Pain Education      Exclude from Growth Chart    No data found.  Updated Vital Signs BP 111/75 (BP Location: Left Arm)   Pulse 96   Temp 98.2 F (36.8 C) (Oral)   Resp 18   LMP 08/16/2024 (Exact Date)   SpO2 96%   Visual Acuity Right Eye Distance:   Left Eye Distance:   Bilateral Distance:    Right Eye Near:   Left Eye Near:    Bilateral Near:     Physical Exam Vitals reviewed.  Constitutional:      General: She is not in acute distress.    Appearance: She is not ill-appearing, toxic-appearing or diaphoretic.  HENT:      Mouth/Throat:     Mouth: Mucous membranes are moist.  Eyes:     Extraocular Movements: Extraocular movements intact.     Conjunctiva/sclera: Conjunctivae normal.     Pupils: Pupils are equal, round, and reactive to light.  Cardiovascular:     Rate and Rhythm: Normal rate and regular rhythm.     Heart sounds: No murmur heard. Pulmonary:     Effort: Pulmonary effort is normal.     Breath sounds: Normal breath sounds.  Abdominal:     Palpations: Abdomen is soft.     Tenderness: There is abdominal tenderness (suprapubic).  Musculoskeletal:     Cervical back: Neck supple.  Lymphadenopathy:     Cervical: No cervical adenopathy.  Neurological:     Mental Status: She is alert.      UC Treatments / Results  Labs (all labs ordered are listed, but only abnormal results are displayed) Labs Reviewed  POCT URINE DIPSTICK - Abnormal; Notable for the following components:      Result Value   Glucose, UA =500 (*)    Blood, UA trace-intact (*)  All other components within normal limits  POCT URINE PREGNANCY - Normal  URINE CULTURE    EKG   Radiology No results found.  Procedures Procedures (including critical care time)  Medications Ordered in UC Medications - No data to display  Initial Impression / Assessment and Plan / UC Course  I have reviewed the triage vital signs and the nursing notes.  Pertinent labs & imaging results that were available during my care of the patient were reviewed by me and considered in my medical decision making (see chart for details).   UA shows just a little bit of red blood cells.  With her symptoms being typical for urinary tract infection, I have still sent a urine culture.  Staff will notify her if antibiotics need to be changed  We cannot use quinolones due to interactions with her methadone .  I have sent in Macrobid  as it was 1 thing that would treat her last urine culture at least in part.  She is warned to go to the emergency room if  she is worsening anyway or if not improving.  I have also asked her to follow-up with primary care about this issue Final Clinical Impressions(s) / UC Diagnoses   Final diagnoses:  Cystitis     Discharge Instructions      Urinalysis shows a little bit of red blood cells.  Urine culture is sent, and staff will notify you that it looks like the antibiotic needs to be changed.  Take nitrofurantoin  100 mg--1 capsule 2 times daily for 7 days  You have had resistant bacteria in your urine cultures recently.  If you are not improving in the next 2 days or if you worsen anyway, please consider going to emergency room.  Please follow-up with your primary care about this issue  Make sure you are drinking enough fluids     ED Prescriptions     Medication Sig Dispense Auth. Provider   nitrofurantoin , macrocrystal-monohydrate, (MACROBID ) 100 MG capsule Take 1 capsule (100 mg total) by mouth 2 (two) times daily for 7 days. 14 capsule Kamilah Correia K, MD      I have reviewed the PDMP during this encounter.   Vonna Sharlet POUR, MD 08/17/24 2014

## 2024-08-17 NOTE — Discharge Instructions (Addendum)
 Urinalysis shows a little bit of red blood cells.  Urine culture is sent, and staff will notify you that it looks like the antibiotic needs to be changed.  Take nitrofurantoin  100 mg--1 capsule 2 times daily for 7 days  You have had resistant bacteria in your urine cultures recently.  If you are not improving in the next 2 days or if you worsen anyway, please consider going to emergency room.  Please follow-up with your primary care about this issue  Make sure you are drinking enough fluids

## 2024-08-17 NOTE — ED Triage Notes (Signed)
 Pt reports dysuria, frequency, and lower abdominal pain that started last night. Has taken azo and tylenol  with little relief. No concern for STIs.

## 2024-08-18 ENCOUNTER — Emergency Department (HOSPITAL_COMMUNITY)
Admission: EM | Admit: 2024-08-18 | Discharge: 2024-08-18 | Disposition: A | Discharge status: Home / Self Care | Payer: MEDICAID | Attending: Emergency Medicine | Admitting: Emergency Medicine

## 2024-08-18 ENCOUNTER — Other Ambulatory Visit: Payer: Self-pay

## 2024-08-18 ENCOUNTER — Other Ambulatory Visit (HOSPITAL_COMMUNITY): Payer: Self-pay

## 2024-08-18 DIAGNOSIS — Z7982 Long term (current) use of aspirin: Secondary | ICD-10-CM | POA: Insufficient documentation

## 2024-08-18 DIAGNOSIS — E1165 Type 2 diabetes mellitus with hyperglycemia: Secondary | ICD-10-CM | POA: Diagnosis not present

## 2024-08-18 DIAGNOSIS — J449 Chronic obstructive pulmonary disease, unspecified: Secondary | ICD-10-CM | POA: Insufficient documentation

## 2024-08-18 DIAGNOSIS — Z794 Long term (current) use of insulin: Secondary | ICD-10-CM | POA: Diagnosis not present

## 2024-08-18 DIAGNOSIS — B9689 Other specified bacterial agents as the cause of diseases classified elsewhere: Secondary | ICD-10-CM | POA: Diagnosis not present

## 2024-08-18 DIAGNOSIS — Z7901 Long term (current) use of anticoagulants: Secondary | ICD-10-CM | POA: Diagnosis not present

## 2024-08-18 DIAGNOSIS — R739 Hyperglycemia, unspecified: Secondary | ICD-10-CM

## 2024-08-18 DIAGNOSIS — N760 Acute vaginitis: Secondary | ICD-10-CM | POA: Diagnosis not present

## 2024-08-18 DIAGNOSIS — R300 Dysuria: Secondary | ICD-10-CM | POA: Diagnosis present

## 2024-08-18 LAB — CBC WITH DIFFERENTIAL/PLATELET
Abs Immature Granulocytes: 0.02 K/uL (ref 0.00–0.07)
Basophils Absolute: 0.1 K/uL (ref 0.0–0.1)
Basophils Relative: 1 %
Eosinophils Absolute: 0.6 K/uL — ABNORMAL HIGH (ref 0.0–0.5)
Eosinophils Relative: 11 %
HCT: 44.4 % (ref 36.0–46.0)
Hemoglobin: 14.6 g/dL (ref 12.0–15.0)
Immature Granulocytes: 0 %
Lymphocytes Relative: 19 %
Lymphs Abs: 1 K/uL (ref 0.7–4.0)
MCH: 30 pg (ref 26.0–34.0)
MCHC: 32.9 g/dL (ref 30.0–36.0)
MCV: 91.2 fL (ref 80.0–100.0)
Monocytes Absolute: 0.5 K/uL (ref 0.1–1.0)
Monocytes Relative: 9 %
Neutro Abs: 3.2 K/uL (ref 1.7–7.7)
Neutrophils Relative %: 60 %
Platelets: 100 K/uL — ABNORMAL LOW (ref 150–400)
RBC: 4.87 MIL/uL (ref 3.87–5.11)
RDW: 13.2 % (ref 11.5–15.5)
WBC: 5.3 K/uL (ref 4.0–10.5)
nRBC: 0 % (ref 0.0–0.2)

## 2024-08-18 LAB — COMPREHENSIVE METABOLIC PANEL WITH GFR
ALT: 23 U/L (ref 0–44)
AST: 27 U/L (ref 15–41)
Albumin: 3.2 g/dL — ABNORMAL LOW (ref 3.5–5.0)
Alkaline Phosphatase: 135 U/L — ABNORMAL HIGH (ref 38–126)
Anion gap: 10 (ref 5–15)
BUN: <5 mg/dL — ABNORMAL LOW (ref 6–20)
CO2: 24 mmol/L (ref 22–32)
Calcium: 8.5 mg/dL — ABNORMAL LOW (ref 8.9–10.3)
Chloride: 100 mmol/L (ref 98–111)
Creatinine, Ser: 0.55 mg/dL (ref 0.44–1.00)
GFR, Estimated: >60 mL/min (ref >60)
Glucose, Bld: 357 mg/dL — ABNORMAL HIGH (ref 70–99)
Potassium: 4.1 mmol/L (ref 3.5–5.1)
Sodium: 134 mmol/L — ABNORMAL LOW (ref 135–145)
Total Bilirubin: 0.5 mg/dL (ref 0.0–1.2)
Total Protein: 6.6 g/dL (ref 6.5–8.1)

## 2024-08-18 LAB — URINALYSIS, ROUTINE W REFLEX MICROSCOPIC
Bacteria, UA: NONE SEEN
Bilirubin Urine: NEGATIVE
Glucose, UA: >=500 mg/dL — AB
Ketones, ur: NEGATIVE mg/dL
Leukocytes,Ua: NEGATIVE
Nitrite: NEGATIVE
Protein, ur: NEGATIVE mg/dL
Specific Gravity, Urine: 1.023 (ref 1.005–1.030)
pH: 8 (ref 5.0–8.0)

## 2024-08-18 LAB — URINE CULTURE: Culture: NO GROWTH

## 2024-08-18 LAB — WET PREP, GENITAL
Sperm: NONE SEEN
Trich, Wet Prep: NONE SEEN
WBC, Wet Prep HPF POC: <10 (ref <10)
Yeast Wet Prep HPF POC: NONE SEEN

## 2024-08-18 LAB — HCG, SERUM, QUALITATIVE: Preg, Serum: NEGATIVE

## 2024-08-18 MED ORDER — NAPROXEN 250 MG PO TABS
500.0000 mg | ORAL_TABLET | Freq: Once | ORAL | Status: AC
  Administered 2024-08-18: 500 mg via ORAL
  Filled 2024-08-18: qty 2

## 2024-08-18 MED ORDER — PHENAZOPYRIDINE HCL 200 MG PO TABS: 200.0000 mg | ORAL_TABLET | Freq: Three times a day (TID) | ORAL | 0 refills | Status: DC

## 2024-08-18 MED ORDER — PHENAZOPYRIDINE HCL 200 MG PO TABS
200.0000 mg | ORAL_TABLET | Freq: Three times a day (TID) | ORAL | 0 refills | Status: DC
  Filled 2024-08-18: qty 6, 2d supply, fill #0

## 2024-08-18 MED ORDER — METRONIDAZOLE 500 MG PO TABS: 500.0000 mg | ORAL_TABLET | Freq: Two times a day (BID) | ORAL | 0 refills | Status: DC

## 2024-08-18 MED ORDER — ONDANSETRON 4 MG PO TBDP
4.0000 mg | ORAL_TABLET | Freq: Once | ORAL | Status: AC
  Administered 2024-08-18: 4 mg via ORAL
  Filled 2024-08-18: qty 1

## 2024-08-18 MED ORDER — METRONIDAZOLE 500 MG PO TABS
500.0000 mg | ORAL_TABLET | Freq: Two times a day (BID) | ORAL | 0 refills | Status: DC
  Filled 2024-08-18: qty 14, 7d supply, fill #0

## 2024-08-18 NOTE — ED Triage Notes (Signed)
 Patient reports dysuria  burning with concentrated/malodorous urinary frequency this week .

## 2024-08-18 NOTE — ED Provider Notes (Signed)
 [place] [facility] AT [facility] Provider Note   CSN: [id number] Arrival date & time: 08/18/24  9686     Patient presents with: UTI   Jaime [name] is a 38 y.o. female. Patient with past medical history of hepatitis C, COPD, cirrhosis, DM2, DVT on eliquis , migraines, chronic low back pain reporting to Er with complaint of 2 days of dysuria. She reports that she just started an antibiotics from urgent care for UTI, she has taken 2 pills and she reports symptoms are not getting better. No rash, fever, vaginal discharge. Just started period 2 days ago. Reports not sexually active.    HPI     Prior to Admission medications   Medication Sig Start Date End Date Taking? Authorizing Provider  acetaminophen  (TYLENOL ) 500 MG tablet Take 1,000 mg by mouth 2 (two) times daily as needed for moderate pain (pain score 4-6) or headache.    [provider]  albuterol  (VENTOLIN  HFA) 108 (90 Base) MCG/ACT inhaler Inhale 2 puffs into the lungs every 6 (six) hours as needed for wheezing or shortness of breath. 07/17/22   Leath-Warren, Etta PARAS, NP  apixaban  (ELIQUIS ) 5 MG TABS tablet Take 2 tablets (10 mg total) by mouth 2 (two) times daily for 6 days, THEN 1 tablet (5 mg total) 2 (two) times daily. 09/28/23 08/17/24  Ricky Fines, MD  ARIPiprazole  (ABILIFY ) 20 MG tablet Take 1 tablet (20 mg total) by mouth daily. 05/31/24   Bevely Doffing, FNP  aspirin  81 MG chewable tablet Chew 81 mg by mouth. 06/12/20   [provider]  betamethasone  dipropionate (DIPROLENE ) 0.05 % ointment Apply topically 2 (two) times daily. 07/05/24   Audriana Aldama, Warren SAILOR, PA-C  buPROPion (WELLBUTRIN XL) 150 MG 24 hr tablet Take 150 mg by mouth. 06/29/24   [provider]  chlorhexidine  (HIBICLENS ) 4 % external liquid Apply topically daily as needed. Patient not taking: Reported on 08/17/2024 05/13/24   Stuart Vernell Norris, PA-C  clonazePAM  (KLONOPIN ) 0.5 MG tablet Take 0.5 mg by mouth  2 (two) times daily as needed for anxiety. 08/02/23   [provider]  Continuous Glucose Receiver (FREESTYLE LIBRE 3 READER) DEVI Use the Freestyle Libre 3 Reader Device to monitor blood sugars as directed by the provider. 02/29/24   Therisa Benton PARAS, NP  Continuous Glucose Sensor (FREESTYLE LIBRE 3 PLUS SENSOR) MISC Change sensor every 15 days. 07/07/24   Therisa Benton PARAS, NP  DULoxetine  (CYMBALTA ) 60 MG capsule Take 60 mg by mouth daily. 07/22/24   [provider]  famotidine  (PEPCID ) 20 MG tablet Take 1 tablet (20 mg total) by mouth at bedtime. 09/28/23   Ricky Fines, MD  ferrous sulfate  325 (65 FE) MG EC tablet Take 1 tablet (325 mg total) by mouth daily with breakfast. 09/13/23   Lamon Herter M, PA-C  fluconazole  (DIFLUCAN ) 150 MG tablet Take 150 mg by mouth once. Patient not taking: Reported on 08/17/2024 07/07/24   [provider]  glucose blood (ACCU-CHEK GUIDE) test strip Use as instructed to check blood glucose four times daily Patient not taking: Reported on 08/17/2024 12/01/21   Therisa Benton PARAS, NP  hydrOXYzine  (ATARAX ) 10 MG tablet Take 1 tablet (10 mg total) by mouth 3 (three) times daily as needed. 05/30/24   Leath-Warren, Etta PARAS, NP  hydrOXYzine  (VISTARIL ) 50 MG capsule Take 1 capsule (50 mg total) by mouth 3 (three) times daily as needed. Patient not taking: Reported on 08/17/2024 05/25/23   Del Wilhelmena Lloyd Sola, FNP  hyoscyamine  (ANASPAZ ) 0.125 MG TBDP disintergrating tablet Place 1 tablet (0.125 mg total) under the tongue every 6 (six) hours as needed. 12/13/23   Eartha Angelia Sieving, MD  Insulin  Pen Needle (PEN NEEDLES) 32G X 6 MM MISC 1 each by Does not apply route 3 (three) times daily. 02/26/21   Therisa Benton PARAS, NP  insulin  regular human CONCENTRATED (HUMULIN  R U-500 KWIKPEN) 500 UNIT/ML KwikPen Inject 80 Units into the skin 3 (three) times daily with meals. 03/06/24   Therisa Benton PARAS, NP  lidocaine  (LIDODERM ) 5 % Place 1 patch  onto the skin daily. Remove & Discard patch within 12 hours or as directed by MD. DO not APPLY TO INFLAMED , RED SKIN Patient not taking: Reported on 08/17/2024 04/12/24   Antonetta Rollene BRAVO, MD  lidocaine  (XYLOCAINE ) 5 % ointment Apply 1 Application topically as needed. Patient not taking: Reported on 08/17/2024 04/27/23   Del Orbe Polanco, Iliana, FNP  lisinopril  (ZESTRIL ) 5 MG tablet Take 5 mg by mouth daily.    [provider]  methadone  (DOLOPHINE ) 10 MG/ML solution Take 105 mg by mouth daily.    [provider]  methocarbamol  (ROBAXIN ) 750 MG tablet Take 1 tablet (750 mg total) by mouth 3 (three) times daily. 07/01/24   Daralene Lonni BIRCH, PA-C  metroNIDAZOLE  (FLAGYL ) 500 MG tablet Take 1 tablet (500 mg total) by mouth 2 (two) times daily. 08/18/24   Kirbie Stodghill, Warren SAILOR, PA-C  MOUNJARO  7.5 MG/0.5ML Pen INJECT 1 PEN SUBCUTANEOUSLY ONCE A WEEK 08/17/24   Therisa Benton PARAS, NP  mupirocin  ointment (BACTROBAN ) 2 % Apply 1 Application topically 2 (two) times daily. 05/13/24   Stuart Vernell Norris, PA-C  NARCAN  4 MG/0.1ML LIQD nasal spray kit Place 1 spray into the nose once as needed (OD). Patient not taking: Reported on 08/17/2024 04/07/24   [provider]  nicotine  (NICODERM CQ  - DOSED IN MG/24 HOURS) 14 mg/24hr patch Place 1 patch (14 mg total) onto the skin daily. 07/07/24   Therisa Benton PARAS, NP  nitrofurantoin , macrocrystal-monohydrate, (MACROBID ) 100 MG capsule Take 1 capsule (100 mg total) by mouth 2 (two) times daily for 7 days. 08/17/24 08/24/24  Banister, Pamela K, MD  nystatin  cream (MYCOSTATIN ) Apply 1 Application topically 2 (two) times daily. Patient not taking: Reported on 08/17/2024 10/22/23   Antonetta Rollene BRAVO, MD  Omega-3 Fatty Acids (ENTERIC FISH OIL) 1000 MG CPDR Take 1,000 mg by mouth 3 (three) times daily. Patient not taking: Reported on 08/17/2024    [provider]  omeprazole  (PRILOSEC) 40 MG capsule TAKE 1 CAPSULE BY MOUTH TWICE DAILY.  05/10/23   Carlan, Chelsea L, NP  ondansetron  (ZOFRAN -ODT) 4 MG disintegrating tablet Take 1 tablet (4 mg total) by mouth every 8 (eight) hours as needed for nausea or vomiting. 08/12/24   Reddick, Johnathan B, NP  pantoprazole  (PROTONIX ) 20 MG tablet Take 20 mg by mouth daily. 12/30/23   [provider]  phenazopyridine (PYRIDIUM) 200 MG tablet Take 1 tablet (200 mg total) by mouth 3 (three) times daily. 08/18/24   Hilma Steinhilber, Warren SAILOR, PA-C  potassium chloride  (KLOR-CON ) 10 MEQ tablet Take 10 mEq by mouth 2 (two) times daily. Patient not taking: Reported on 08/17/2024 08/10/23   [provider]  pregabalin  (LYRICA ) 75 MG capsule Take 75 mg by mouth. 07/28/24   [provider]  propranolol  (INDERAL ) 10 MG tablet Take 1 tablet (10 mg total) by mouth 2 (two) times daily. 09/28/23   Ricky Fines, MD  silver  sulfADIAZINE  (SILVADENE )  1 % cream APPLY 1 APPLCATION TOPICALLY TWICE A DAY Patient not taking: Reported on 08/17/2024 07/25/24   Del Orbe Polanco, Iliana, FNP  SUMAtriptan  (IMITREX ) 50 MG tablet Take 1 tablet (50 mg total) by mouth every 2 (two) hours as needed for migraine or headache. May repeat in 2 hours if headache persists or recurs.  Maximum of 200 mg per 24 hours. Patient not taking: Reported on 08/17/2024 08/12/24   Reddick, Johnathan B, NP  torsemide  (DEMADEX ) 20 MG tablet Take 2 tablets (40 mg total) by mouth daily. Patient not taking: Reported on 08/17/2024 09/28/23   Ricky Fines, MD  traZODone  (DESYREL ) 150 MG tablet Take 1-2 tablets (150-300 mg total) by mouth at bedtime as needed for sleep. Patient not taking: Reported on 08/17/2024 06/22/24 07/22/24  Jude Harden GAILS, MD  triamcinolone  cream (KENALOG ) 0.5 % Apply topically. Patient not taking: Reported on 08/17/2024 07/28/24 08/27/24  [provider]  venlafaxine  XR (EFFEXOR  XR) 150 MG 24 hr capsule Take 1 capsule (150 mg total) by mouth daily with breakfast. 05/31/24   Bevely Doffing, FNP    Allergies:  Firvanq  [vancomycin ], Iodine -131, Ivp dye [iodinated contrast media], Toradol [ketorolac tromethamine], Tylenol  [acetaminophen ], Nsaids, Suboxone  [buprenorphine hcl-naloxone  hcl], and Neurontin  [gabapentin ]    Review of Systems  Genitourinary:  Positive for dysuria, urgency and vaginal bleeding. Negative for flank pain.    Updated Vital Signs BP 114/72 (BP Location: Right Arm)   Pulse 78   Temp 98 F (36.7 C) (Oral)   Resp 18   LMP 08/16/2024 (Exact Date)   SpO2 100%   Physical Exam Vitals and nursing note reviewed.  Constitutional:      General: She is not in acute distress.    Appearance: She is not toxic-appearing.  HENT:     Head: Normocephalic and atraumatic.  Eyes:     General: No scleral icterus.    Conjunctiva/sclera: Conjunctivae normal.  Cardiovascular:     Rate and Rhythm: Normal rate and regular rhythm.     Pulses: Normal pulses.     Heart sounds: Normal heart sounds.  Pulmonary:     Effort: Pulmonary effort is normal. No respiratory distress.     Breath sounds: Normal breath sounds.  Abdominal:     General: Abdomen is flat. Bowel sounds are normal. There is no distension.     Palpations: Abdomen is soft. There is no mass.     Tenderness: There is no abdominal tenderness. There is no right CVA tenderness or left CVA tenderness.  Genitourinary:    Vagina: Vaginal discharge present.     Comments: Deferred pelvic. Skin:    General: Skin is warm and dry.     Findings: No lesion.  Neurological:     General: No focal deficit present.     Mental Status: She is alert and oriented to person, place, and time. Mental status is at baseline.     (all labs ordered are listed, but only abnormal results are displayed) Labs Reviewed  WET PREP, GENITAL - Abnormal; Notable for the following components:      Result Value   Clue Cells Wet Prep HPF POC PRESENT (*)    All other components within normal limits  URINALYSIS, ROUTINE W REFLEX MICROSCOPIC - Abnormal; Notable for  the following components:   Glucose, UA >=500 (*)    Hgb urine dipstick MODERATE (*)    All other components within normal limits  CBC WITH DIFFERENTIAL/PLATELET - Abnormal; Notable for the following components:   Platelets  100 (*)    Eosinophils Absolute 0.6 (*)    All other components within normal limits  COMPREHENSIVE METABOLIC PANEL WITH GFR - Abnormal; Notable for the following components:   Sodium 134 (*)    Glucose, Bld 357 (*)    BUN <5 (*)    Calcium  8.5 (*)    Albumin  3.2 (*)    Alkaline Phosphatase 135 (*)    All other components within normal limits  HCG, SERUM, QUALITATIVE  GC/CHLAMYDIA PROBE AMP ([place]) NOT AT [facility]    EKG: None  Radiology: No results found.   Procedures   Medications Ordered in the ED  naproxen  (NAPROSYN ) tablet 500 mg (500 mg Oral Given 08/18/24 0738)  ondansetron  (ZOFRAN -ODT) disintegrating tablet 4 mg (4 mg Oral Given 08/18/24 0726)    Clinical Course as of 08/18/24 0752  Fri Aug 18, 2024  0711 Reports she has tolerated Naprosyn  in the past and that she does not have allergy to NSAIDs. [JB]  0749 Clue Cells Wet Prep HPF POC(!): PRESENT [JB]    Clinical Course User Index [JB] Guiselle Mian, Warren SAILOR, PA-C                                 Medical Decision Making Amount and/or Complexity of Data Reviewed Labs: ordered. Decision-making details documented in ED Course.  Risk Prescription drug management.  This patient presents to the ED for concern of dysuria, this involves an extensive number of treatment options, and is a complaint that carries with it a high risk of complications and morbidity.  The differential diagnosis includes UTI, yeast infection, STD, kidney stone, pylenephritis    Co morbidities that complicate the patient evaluation  hepatitis C, COPD, cirrhosis, DM2, DVT on eliquis , migraines, chronic low back pain   Additional history obtained:  Additional history obtained from 08/17/24 UC visit for similar  complaint. Dx with UTI at that time, they did start Bactrim  and sent urine for culture.    Lab Tests:  I personally interpreted labs.  The pertinent results include:   No leukocytosis CMP with glucose of 357, but no anion gap, no ketones in urine.  UA with leukocytes, nitries or bacteria.  Wet prep positive for Clue Cells, suspect BV and less likely UTI   Cardiac Monitoring: / EKG:  The patient was maintained on a cardiac monitor.      Problem List / ED Course / Critical interventions / Medication management  Patient presents to emergency room with complaint of dysuria and malodorous urine for a few days.  She did recently get diagnosed with UTI at urgent care and started Bactrim  but has not taken more than a day of this medicine.  On my exam she is significant focal tenderness.  She also does not having flank pain.  She has no CVA tenderness.  Her UTI does not show any convincing urinary tract infection however yesterday she started on Bactrim  for UTI.  Her wet prep does show clue cells, feel this is most likely related to BV.  I ordered medication including zofran , naprosyn   Reevaluation of the patient after these medicines showed that the patient improved I have reviewed the patients home medicines and have made adjustments as needed. Hemodynamically stable and well.  No sign of systemic illness. Feel appropriate for discharge with outpatient follow-up.          Final diagnoses:  Blood glucose elevated  BV (bacterial vaginosis)  ED Discharge Orders          Ordered    phenazopyridine (PYRIDIUM) 200 MG tablet  3 times daily,   Status:  Discontinued        08/18/24 0726    metroNIDAZOLE  (FLAGYL ) 500 MG tablet  2 times daily,   Status:  Discontinued        08/18/24 0746    metroNIDAZOLE  (FLAGYL ) 500 MG tablet  2 times daily        08/18/24 0749    phenazopyridine (PYRIDIUM) 200 MG tablet  3 times daily        08/18/24 0749               Ilanna Deihl, Warren SAILOR,  PA-C 08/18/24 0752    Long, Joshua G, MD 08/19/24 [phone]

## 2024-08-18 NOTE — Discharge Instructions (Addendum)
 Your testing shows Bacterial Vaginosis or BV. This is treated with an antibiotic called Flagyl , you can't drink alcohol with this medication or it can make you very sick.   Please discontinue treatment recommended by urgent care as I think UTI is less likely, you can add pyridium. Take Tylenol  and Naprosyn  for pain control. You can also try ice or heat for back pain.   Return to ER with new or worsening symptoms.

## 2024-08-21 LAB — GC/CHLAMYDIA PROBE AMP (~~LOC~~) NOT AT ARMC
Chlamydia: NEGATIVE
Comment: NEGATIVE
Comment: NORMAL
Neisseria Gonorrhea: NEGATIVE

## 2024-08-23 ENCOUNTER — Ambulatory Visit: Payer: MEDICAID | Admitting: Physician Assistant

## 2024-08-23 ENCOUNTER — Telehealth: Payer: Self-pay | Admitting: Pharmacy Technician

## 2024-08-23 ENCOUNTER — Encounter: Payer: Self-pay | Admitting: Physician Assistant

## 2024-08-23 ENCOUNTER — Other Ambulatory Visit: Payer: Self-pay | Admitting: Nurse Practitioner

## 2024-08-23 ENCOUNTER — Other Ambulatory Visit (HOSPITAL_COMMUNITY): Payer: Self-pay

## 2024-08-23 VITALS — BP 119/75 | HR 87 | Ht 70.0 in | Wt 228.0 lb

## 2024-08-23 DIAGNOSIS — L03116 Cellulitis of left lower limb: Secondary | ICD-10-CM

## 2024-08-23 DIAGNOSIS — Z794 Long term (current) use of insulin: Secondary | ICD-10-CM

## 2024-08-23 DIAGNOSIS — I825Z2 Chronic embolism and thrombosis of unspecified deep veins of left distal lower extremity: Secondary | ICD-10-CM | POA: Diagnosis not present

## 2024-08-23 DIAGNOSIS — Z7985 Long-term (current) use of injectable non-insulin antidiabetic drugs: Secondary | ICD-10-CM

## 2024-08-23 DIAGNOSIS — Z7984 Long term (current) use of oral hypoglycemic drugs: Secondary | ICD-10-CM

## 2024-08-23 DIAGNOSIS — E1165 Type 2 diabetes mellitus with hyperglycemia: Secondary | ICD-10-CM

## 2024-08-23 MED ORDER — DOXYCYCLINE HYCLATE 100 MG PO CAPS: 100.0000 mg | ORAL_CAPSULE | Freq: Two times a day (BID) | ORAL | 0 refills | Status: AC

## 2024-08-23 NOTE — Patient Instructions (Addendum)
 VISIT SUMMARY:  During your visit, we discussed your ongoing left leg pain, swelling, and itching, which have persisted for two to three weeks. We reviewed your history of deep vein thrombosis (DVT) and recent treatment for cellulitis.   YOUR PLAN:  -CELLULITIS OF LEFT LOWER EXTREMITY: Cellulitis is a bacterial skin infection that causes redness, swelling, and pain. You will start taking doxycycline  100 mg twice daily for 10 days to treat the infection. Please elevate your leg to help reduce swelling. If your symptoms worsen or you experience shortness of breath or chest pain, seek emergency care immediately.  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 You are currently managing this with Eliquis . Continue taking your medication as prescribed.  INSTRUCTIONS:  Please schedule a follow-up appointment assess the resolution of your cellulitis.   Cellulitis, Adult  Cellulitis is a skin infection. The infected area is often warm, red, swollen, and sore. It occurs most often on the legs, feet, and toes, but can happen on any part of the body. This condition can be life-threatening without treatment. It is very important to get treated right away. What are the causes? This condition is caused by bacteria. The bacteria enter through a break in the skin, such as: A cut. A burn. A bug bite. An animal bite. An open sore. A crack. What increases the risk? Having a weak body's defense system (immune system). Being older than 40 years old. Having a blood sugar problem (diabetes). Having a long-term liver disease (cirrhosis) or kidney disease. Being very overweight (obese). Having a skin problem, such as: An itchy rash. A rash caused by a fungus. A rash with blisters. Slow movement of blood in the veins (venous stasis). Fluid buildup below the skin (edema). This condition is more likely to occur in  people who: Have open cuts, burns, bites, or scrapes on the skin. Have been treated with high-energy rays (radiation). Use IV drugs. What are the signs or symptoms? Skin that: Looks red or purple, or slightly darker than your usual skin color. Has streaks. Has spots. Is swollen. Is sore or painful when you touch it. Is warm. A fever. Chills. Blisters. Tiredness (fatigue). How is this treated? Medicines to treat infections or allergies. Rest. Placing cold or warm cloths on the skin. Staying in the hospital, if the condition is very bad. You may need medicines through an IV. Follow these instructions at home: Medicines Take over-the-counter and prescription medicines only as told by your doctor. If you were prescribed antibiotics, take them as told by your doctor. Do not stop using them even if you start to feel better. General instructions Drink enough fluid to keep your pee (urine) pale yellow. Do not touch or rub the infected area. Raise (elevate) the infected area above the level of your heart while you are sitting or lying down. Return to your normal activities when your doctor says that it is safe. Place cold or warm cloths on the area as told by your doctor. Keep all follow-up visits. Your doctor will need to make sure that a more serious infection is not developing. Contact a doctor if: You have a fever. You do not start to get better after 1-2 days of treatment. Your bone or joint under the infected area starts to hurt after the skin has healed. Your infection comes back in the same area or another area. Signs of this may  include: You have a swollen bump in the area. Your red area gets larger, turns dark in color, or hurts more. You have more fluid coming from the wound. Pus or a bad smell develops in your infected area. You have more pain. You feel sick and have muscle aches and weakness. You develop vomiting or watery poop that will not go away. Get help right  away if: You see red streaks coming from the area. You notice the skin turns purple or black and falls off. These symptoms may be an emergency. Get help right away. Call 911. Do not wait to see if the symptoms will go away. Do not drive yourself to the hospital. This information is not intended to replace advice given to you by your health care provider. Make sure you discuss any questions you have with your health care provider. Document Revised: 06/16/2022 Document Reviewed: 06/16/2022 Elsevier Patient Education  2024 [employer].

## 2024-08-23 NOTE — Telephone Encounter (Signed)
 Pharmacy Patient Advocate Encounter   Received notification from CoverMyMeds that prior authorization for Mounjaro  7.5MG /0.5ML auto-injectors  is required/requested.   Insurance verification completed.   The patient is insured through [employer] MEDICAID.   Per test claim: PA required; PA submitted to above mentioned insurance via Latent Key/confirmation #/EOC [id number] Status is pending

## 2024-08-23 NOTE — Progress Notes (Signed)
 New Patient Office Visit  Subjective    Patient ID: Jaime [name], female    DOB: [birth year]  Age: 38 y.o. MRN: [id number]  CC:  Chief Complaint  Patient presents with   Leg Swelling    Pt is having LT leg pain and swelling with redness, reports the area being itchy. Onset a few weeks ago  Discussed the use of AI scribe software for clinical note transcription with the patient, who gave verbal consent to proceed.  History of Present Illness  HPI Jaime [name] is a 38 year old female with a history of a DVT presents with left leg pain, swelling, and itching. She has reported experiencing left leg pain, swelling, and itching for 2 months, and was recently hospitalized in September, 2025. On 9/21, patient had a peripheral venous leg ultrasound resulting in no evidence of acute deep vein thrombosis, and a small area in the popliteal vein that does not fully compress and may represent a chronic clot. Patient completed a course of Keflex  for cellulitis without symptom improvement and has been using an ineffective cream for relief. The pain is sharp, stabbing, and radiated to her toes, which are numb. It persists throughout the day and worsens with waking. Sitting or elevating her leg provides relief. Tylenol  and oxycodone  have not been effective for pain management. She takes Eliquis  daily, and has also recently started Mounjaro .   Outpatient Encounter Medications as of 08/23/2024  Medication Sig   acetaminophen  (TYLENOL ) 500 MG tablet Take 1,000 mg by mouth 2 (two) times daily as needed for moderate pain (pain score 4-6) or headache.   albuterol  (VENTOLIN  HFA) 108 (90 Base) MCG/ACT inhaler Inhale 2 puffs into the lungs every 6 (six) hours as needed for wheezing or shortness of breath.   apixaban  (ELIQUIS ) 5 MG TABS tablet Take 5 mg by mouth.   ARIPiprazole  (ABILIFY ) 20 MG tablet Take 1 tablet (20 mg total) by mouth daily.   betamethasone  dipropionate (DIPROLENE ) 0.05 % ointment Apply  topically 2 (two) times daily.   buPROPion (WELLBUTRIN XL) 150 MG 24 hr tablet Take 150 mg by mouth.   chlorhexidine  (HIBICLENS ) 4 % external liquid Apply topically daily as needed.   clonazePAM  (KLONOPIN ) 0.5 MG tablet Take 0.5 mg by mouth 2 (two) times daily as needed for anxiety.   Continuous Glucose Receiver (FREESTYLE LIBRE 3 READER) DEVI Use the Freestyle Libre 3 Reader Device to monitor blood sugars as directed by the provider.   doxycycline  (VIBRAMYCIN ) 100 MG capsule Take 1 capsule (100 mg total) by mouth 2 (two) times daily for 10 days.   DULoxetine  (CYMBALTA ) 60 MG capsule Take 60 mg by mouth daily.   famotidine  (PEPCID ) 20 MG tablet Take 1 tablet (20 mg total) by mouth at bedtime.   ferrous sulfate  325 (65 FE) MG EC tablet Take 1 tablet (325 mg total) by mouth daily with breakfast.   hydrOXYzine  (ATARAX ) 10 MG tablet Take 1 tablet (10 mg total) by mouth 3 (three) times daily as needed.   hydrOXYzine  (VISTARIL ) 50 MG capsule Take 1 capsule (50 mg total) by mouth 3 (three) times daily as needed.   hyoscyamine  (ANASPAZ ) 0.125 MG TBDP disintergrating tablet Place 1 tablet (0.125 mg total) under the tongue every 6 (six) hours as needed.   Insulin  Pen Needle (PEN NEEDLES) 32G X 6 MM MISC 1 each by Does not apply route 3 (three) times daily.   insulin  regular human CONCENTRATED (HUMULIN  R U-500 KWIKPEN) 500 UNIT/ML KwikPen Inject 80  Units into the skin 3 (three) times daily with meals.   lisinopril  (ZESTRIL ) 5 MG tablet Take 5 mg by mouth daily.   methadone  (DOLOPHINE ) 10 MG/ML solution Take 105 mg by mouth daily.   methocarbamol  (ROBAXIN ) 750 MG tablet Take 1 tablet (750 mg total) by mouth 3 (three) times daily.   MOUNJARO  7.5 MG/0.5ML Pen INJECT 1 PEN SUBCUTANEOUSLY ONCE A WEEK   mupirocin  ointment (BACTROBAN ) 2 % Apply 1 Application topically 2 (two) times daily.   nicotine  (NICODERM CQ  - DOSED IN MG/24 HOURS) 14 mg/24hr patch Place 1 patch (14 mg total) onto the skin daily.    nitrofurantoin , macrocrystal-monohydrate, (MACROBID ) 100 MG capsule Take 1 capsule (100 mg total) by mouth 2 (two) times daily for 7 days.   Omega-3 Fatty Acids (ENTERIC FISH OIL) 1000 MG CPDR Take 1,000 mg by mouth 3 (three) times daily.   omeprazole  (PRILOSEC) 40 MG capsule TAKE 1 CAPSULE BY MOUTH TWICE DAILY.   ondansetron  (ZOFRAN -ODT) 4 MG disintegrating tablet Take 1 tablet (4 mg total) by mouth every 8 (eight) hours as needed for nausea or vomiting.   pantoprazole  (PROTONIX ) 20 MG tablet Take 20 mg by mouth daily.   phenazopyridine (PYRIDIUM) 200 MG tablet Take 1 tablet (200 mg total) by mouth 3 (three) times daily.   pregabalin  (LYRICA ) 75 MG capsule Take 75 mg by mouth.   propranolol  (INDERAL ) 10 MG tablet Take 1 tablet (10 mg total) by mouth 2 (two) times daily.   venlafaxine  XR (EFFEXOR  XR) 150 MG 24 hr capsule Take 1 capsule (150 mg total) by mouth daily with breakfast.   [DISCONTINUED] Continuous Glucose Sensor (FREESTYLE LIBRE 3 PLUS SENSOR) MISC Change sensor every 15 days.   apixaban  (ELIQUIS ) 5 MG TABS tablet Take 2 tablets (10 mg total) by mouth 2 (two) times daily for 6 days, THEN 1 tablet (5 mg total) 2 (two) times daily.   aspirin  81 MG chewable tablet Chew 81 mg by mouth. (Patient not taking: Reported on 08/23/2024)   fluconazole  (DIFLUCAN ) 150 MG tablet Take 150 mg by mouth once. (Patient not taking: Reported on 08/23/2024)   glucose blood (ACCU-CHEK GUIDE) test strip Use as instructed to check blood glucose four times daily (Patient not taking: Reported on 08/17/2024)   lidocaine  (LIDODERM ) 5 % Place 1 patch onto the skin daily. Remove & Discard patch within 12 hours or as directed by MD. DO not APPLY TO INFLAMED , RED SKIN (Patient not taking: Reported on 08/17/2024)   lidocaine  (XYLOCAINE ) 5 % ointment Apply 1 Application topically as needed. (Patient not taking: Reported on 08/17/2024)   metroNIDAZOLE  (FLAGYL ) 500 MG tablet Take 1 tablet (500 mg total) by mouth 2 (two) times  daily. (Patient not taking: Reported on 08/23/2024)   NARCAN  4 MG/0.1ML LIQD nasal spray kit Place 1 spray into the nose once as needed (OD). (Patient not taking: Reported on 08/17/2024)   nystatin  cream (MYCOSTATIN ) Apply 1 Application topically 2 (two) times daily. (Patient not taking: Reported on 08/17/2024)   potassium chloride  (KLOR-CON ) 10 MEQ tablet Take 10 mEq by mouth 2 (two) times daily. (Patient not taking: Reported on 08/23/2024)   silver  sulfADIAZINE  (SILVADENE ) 1 % cream APPLY 1 APPLCATION TOPICALLY TWICE A DAY (Patient not taking: Reported on 08/17/2024)   SUMAtriptan  (IMITREX ) 50 MG tablet Take 1 tablet (50 mg total) by mouth every 2 (two) hours as needed for migraine or headache. May repeat in 2 hours if headache persists or recurs.  Maximum of 200 mg per 24  hours. (Patient not taking: Reported on 08/17/2024)   torsemide  (DEMADEX ) 20 MG tablet Take 2 tablets (40 mg total) by mouth daily. (Patient not taking: Reported on 08/17/2024)   traZODone  (DESYREL ) 150 MG tablet Take 1-2 tablets (150-300 mg total) by mouth at bedtime as needed for sleep. (Patient not taking: Reported on 08/17/2024)   triamcinolone  cream (KENALOG ) 0.5 % Apply topically. (Patient not taking: Reported on 08/17/2024)   Facility-Administered Encounter Medications as of 08/23/2024  Medication   etonogestrel  (NEXPLANON ) implant 68 mg    Past Medical History:  Diagnosis Date   Acute encephalopathy 08/31/2020   Adjustment disorder with depressed mood 09/07/2020   Anxiety    Asthma    Chronic abdominal pain    Chronic back pain    Chronic prescription opiate use 02/16/2022   Cirrhosis (HCC)    Cirrhosis (HCC)    COPD (chronic obstructive pulmonary disease) (HCC)    Depression    Diabetes mellitus without complication (HCC)    Diabetes mellitus, type II (HCC)    Diverticulosis    DVT (deep venous thrombosis) (HCC) 02/04/2023   taking lovenox  prior to procedure   Fatty liver 03/07/2020   GERD  (gastroesophageal reflux disease)    Hepatitis C    Hernia, abdominal    Hyperglycemia due to type 2 diabetes mellitus (HCC) 05/24/2020   Hyperlipidemia    IBS (irritable bowel syndrome) 02/16/2022   Insomnia    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)    Lupus    Migraine headache    Morbid obesity (HCC) 05/24/2020   Neuropathy    Nocturnal seizures (HCC)    Pain of upper abdomen 07/28/2021   Peptic ulcer    Spleen enlarged    Splenomegaly     Past Surgical History:  Procedure Laterality Date   BIOPSY  11/25/2021   Procedure: BIOPSY;  Surgeon: Eartha Angelia Sieving, MD;  Location: [facility];  Service: Gastroenterology;;   CHOLECYSTECTOMY     COLONOSCOPY WITH PROPOFOL  N/A 11/25/2021   Procedure: COLONOSCOPY WITH PROPOFOL ;  Surgeon: Eartha Angelia Sieving, MD;  Location: [facility];  Service: Gastroenterology;  Laterality: N/A;  805   COLONOSCOPY WITH PROPOFOL  N/A 03/31/2022   Procedure: COLONOSCOPY WITH PROPOFOL ;  Surgeon: Eartha Angelia Sieving, MD;  Location: [facility];  Service: Gastroenterology;  Laterality: N/A;  730   ESOPHAGOGASTRODUODENOSCOPY  06/2020   done at [facility], candida in upper esophagus (treated with diflucan ), ulcerative esophagitis at GE junction, gastritis in stomach, single ulcer in duodenal bulb, with duodenal mucosa showing no abnormality. No presence of varices   ESOPHAGOGASTRODUODENOSCOPY (EGD) WITH PROPOFOL  N/A 11/25/2021   Procedure: ESOPHAGOGASTRODUODENOSCOPY (EGD) WITH PROPOFOL ;  Surgeon: Eartha Angelia Sieving, MD;  Location: [facility];  Service: Gastroenterology;  Laterality: N/A;   ESOPHAGOGASTRODUODENOSCOPY (EGD) WITH PROPOFOL  N/A 03/31/2022   Procedure: ESOPHAGOGASTRODUODENOSCOPY (EGD) WITH PROPOFOL ;  Surgeon: Eartha Angelia Sieving, MD;  Location: [facility];  Service: Gastroenterology;  Laterality: N/A;   IVC FILTER REMOVAL N/A 09/16/2023   Procedure: IVC FILTER REMOVAL;  Surgeon: Pearline Norman RAMAN, MD;  Location: [facility] INVASIVE CV LAB;  Service: Cardiovascular;  Laterality: N/A;   IVC FILTER REMOVAL N/A 02/14/2024   Procedure: IVC FILTER REMOVAL;  Surgeon: Pearline Norman RAMAN, MD;  Location: [facility] INVASIVE CV LAB;  Service: Cardiovascular;  Laterality: N/A;   PERIPHERAL VASCULAR THROMBECTOMY Left 09/16/2023   Procedure: PERIPHERAL VASCULAR THROMBECTOMY;  Surgeon: Pearline Norman RAMAN, MD;  Location: [facility] INVASIVE CV LAB;  Service: Cardiovascular;  Laterality: Left;    Family History  Problem Relation Age of Onset   Hyperlipidemia Maternal Grandfather     Social History   Socioeconomic History   Marital status: Married    Spouse name: Not on file   Number of children: Not on file   Years of education: Not on file   Highest education level: Some college, no degree  Occupational History   Not on file  Tobacco Use   Smoking status: Every Day    Current packs/day: 0.50    Types: Cigarettes    Passive exposure: Current   Smokeless tobacco: Never  Vaping Use   Vaping status: Some Days   Substances: Nicotine , Flavoring  Substance and Sexual Activity   Alcohol use: No   Drug use: Not Currently   Sexual activity: Yes    Birth control/protection: Implant  Other Topics Concern   Not on file  Social History Narrative   Not on file   Social Drivers of Health   Financial Resource Strain: High Risk (12/09/2023)   Overall Financial Resource Strain (CARDIA)    Difficulty of Paying Living Expenses: Very hard  Food Insecurity: High Risk (07/26/2024)   Received from [facility]   Hunger Vital Sign    Within the past 12 months, you worried that your food would run out before you got money to buy more: Often true    Within the past 12 months, the food you bought just didn't last and you didn't have money to get more. : Often true  Transportation Needs: Unmet Transportation Needs (07/26/2024)   Received from [facility]    In the past 12 months, has lack of reliable  transportation kept you from medical appointments, meetings, work or from getting things needed for daily living? : Yes  Physical Activity: Insufficiently Active (12/09/2023)   Exercise Vital Sign    Days of Exercise per Week: 1 day    Minutes of Exercise per Session: 10 min  Stress: Stress Concern Present (05/09/2020)   [employer] of Occupational Health - Occupational Stress Questionnaire    Feeling of Stress : Very much  Social Connections: Moderately Integrated (12/09/2023)   Social Connection and Isolation Panel    Frequency of Communication with Friends and Family: More than three times a week    Frequency of Social Gatherings with Friends and Family: More than three times a week    Attends Religious Services: 1 to 4 times per year    Active Member of [employer] or Organizations: Yes    Attends Banker Meetings: 1 to 4 times per year    Marital Status: Separated  Intimate Partner Violence: Not At Risk (09/25/2023)   Humiliation, Afraid, Rape, and Kick questionnaire    Fear of Current or Ex-Partner: No    Emotionally Abused: No    Physically Abused: No    Sexually Abused: No    Review of Systems  Constitutional: Negative.   HENT: Negative.    Eyes: Negative.   Respiratory: Negative.    Cardiovascular:  Positive for leg swelling. Negative for chest pain and palpitations.  Gastrointestinal: Negative.   Genitourinary: Negative.   Skin:  Positive for itching.  Neurological: Negative.   Endo/Heme/Allergies: Negative.   Psychiatric/Behavioral: Negative.        Objective    BP 119/75 (BP Location: Left Arm, Patient Position: Sitting, Cuff Size: Normal)   Pulse 87   Ht 5' 10 (1.778 m)   Wt 228 lb (103.4  kg)   LMP 08/16/2024 (Exact Date)   SpO2 97%   BMI 32.71 kg/m   Physical Exam Vitals and nursing note reviewed.  Constitutional:      Appearance: Normal appearance.  HENT:     Head: Normocephalic.     Right Ear: External ear normal.     Left Ear: External  ear normal.     Nose: Nose normal.     Mouth/Throat:     Mouth: Mucous membranes are moist.  Eyes:     Pupils: Pupils are equal, round, and reactive to light.  Cardiovascular:     Rate and Rhythm: Normal rate and regular rhythm.     Pulses: Normal pulses.     Heart sounds: Normal heart sounds.  Pulmonary:     Effort: Pulmonary effort is normal.     Breath sounds: Normal breath sounds.  Abdominal:     General: Abdomen is flat. Bowel sounds are normal.     Palpations: Abdomen is soft.  Musculoskeletal:     Cervical back: Normal range of motion.     Right lower leg: Normal.     Left lower leg: Swelling and tenderness present. 1+ Edema present.  Skin:    General: Skin is warm and dry.     Capillary Refill: Capillary refill takes less than 2 seconds.  Neurological:     General: No focal deficit present.     Mental Status: She is alert and oriented to person, place, and time.  Psychiatric:        Mood and Affect: Mood normal.        Behavior: Behavior normal.      Assessment & Plan:   Problem List Items Addressed This Visit       Other   Cellulitis of left lower extremity - Primary   Relevant Medications   doxycycline  (VIBRAMYCIN ) 100 MG capsule  Cellulitis of left lower extremity: Persistent cellulitis, symptoms consistent with cellulitis rather than DVT. - Prescribed doxycycline  100mg  twice daily for 10 days.  - Instructed to elevate leg to reduce swelling.  - Advised follow-up appointment after full completion of antibiotics. - Advised to seek emergency care if symptoms worsen or she experienced shortness of breathe or chest pain.   Chronic deep vein thrombosis of left lower extremity - Chronic DVT confirmed by ultrasound 9/21, managed with Eliquis .    I have reviewed the patient's medical history (PMH, PSH, Social History, Family History, Medications, and allergies) , and have been updated if relevant. I spent 30 minutes reviewing chart and  face to face time with  patient.   Return in about 2 weeks (around 09/06/2024) for With [facility].   Kirk RAMAN Mayers, PA-C

## 2024-08-24 ENCOUNTER — Encounter (HOSPITAL_BASED_OUTPATIENT_CLINIC_OR_DEPARTMENT_OTHER): Payer: Self-pay

## 2024-08-24 ENCOUNTER — Other Ambulatory Visit: Payer: Self-pay

## 2024-08-24 ENCOUNTER — Emergency Department (HOSPITAL_BASED_OUTPATIENT_CLINIC_OR_DEPARTMENT_OTHER): Payer: MEDICAID

## 2024-08-24 ENCOUNTER — Emergency Department (HOSPITAL_BASED_OUTPATIENT_CLINIC_OR_DEPARTMENT_OTHER)
Admission: EM | Admit: 2024-08-24 | Discharge: 2024-08-25 | Disposition: A | Discharge status: Home / Self Care | Payer: MEDICAID

## 2024-08-24 ENCOUNTER — Other Ambulatory Visit: Payer: Self-pay | Admitting: Family Medicine

## 2024-08-24 ENCOUNTER — Other Ambulatory Visit (HOSPITAL_COMMUNITY): Payer: Self-pay

## 2024-08-24 DIAGNOSIS — R0602 Shortness of breath: Secondary | ICD-10-CM | POA: Diagnosis present

## 2024-08-24 DIAGNOSIS — Z7984 Long term (current) use of oral hypoglycemic drugs: Secondary | ICD-10-CM | POA: Diagnosis not present

## 2024-08-24 DIAGNOSIS — Z7982 Long term (current) use of aspirin: Secondary | ICD-10-CM | POA: Insufficient documentation

## 2024-08-24 DIAGNOSIS — M7989 Other specified soft tissue disorders: Secondary | ICD-10-CM | POA: Diagnosis not present

## 2024-08-24 DIAGNOSIS — Z7901 Long term (current) use of anticoagulants: Secondary | ICD-10-CM | POA: Insufficient documentation

## 2024-08-24 DIAGNOSIS — E119 Type 2 diabetes mellitus without complications: Secondary | ICD-10-CM | POA: Diagnosis not present

## 2024-08-24 DIAGNOSIS — Z794 Long term (current) use of insulin: Secondary | ICD-10-CM | POA: Insufficient documentation

## 2024-08-24 DIAGNOSIS — L03116 Cellulitis of left lower limb: Secondary | ICD-10-CM

## 2024-08-24 LAB — COMPREHENSIVE METABOLIC PANEL WITH GFR
ALT: 34 U/L (ref 0–44)
AST: 41 U/L (ref 15–41)
Albumin: 3.8 g/dL (ref 3.5–5.0)
Alkaline Phosphatase: 152 U/L — ABNORMAL HIGH (ref 38–126)
Anion gap: 10 (ref 5–15)
BUN: 7 mg/dL (ref 6–20)
CO2: 27 mmol/L (ref 22–32)
Calcium: 8.7 mg/dL — ABNORMAL LOW (ref 8.9–10.3)
Chloride: 100 mmol/L (ref 98–111)
Creatinine, Ser: 0.55 mg/dL (ref 0.44–1.00)
GFR, Estimated: >60 mL/min (ref >60)
Glucose, Bld: 303 mg/dL — ABNORMAL HIGH (ref 70–99)
Potassium: 4 mmol/L (ref 3.5–5.1)
Sodium: 136 mmol/L (ref 135–145)
Total Bilirubin: 0.7 mg/dL (ref 0.0–1.2)
Total Protein: 6.6 g/dL (ref 6.5–8.1)

## 2024-08-24 LAB — CBC WITH DIFFERENTIAL/PLATELET
Abs Immature Granulocytes: 0.07 K/uL (ref 0.00–0.07)
Basophils Absolute: 0 K/uL (ref 0.0–0.1)
Basophils Relative: 0 %
Eosinophils Absolute: 0 K/uL (ref 0.0–0.5)
Eosinophils Relative: 0 %
HCT: 42.8 % (ref 36.0–46.0)
Hemoglobin: 12.9 g/dL (ref 12.0–15.0)
Immature Granulocytes: 1 %
Lymphocytes Relative: 7 %
Lymphs Abs: 0.8 K/uL (ref 0.7–4.0)
MCH: 23.8 pg — ABNORMAL LOW (ref 26.0–34.0)
MCHC: 30.1 g/dL (ref 30.0–36.0)
MCV: 79.1 fL — ABNORMAL LOW (ref 80.0–100.0)
Monocytes Absolute: 0.7 K/uL (ref 0.1–1.0)
Monocytes Relative: 7 %
Neutro Abs: 9.8 K/uL — ABNORMAL HIGH (ref 1.7–7.7)
Neutrophils Relative %: 85 %
Platelets: 288 K/uL (ref 150–400)
RBC: 5.41 MIL/uL — ABNORMAL HIGH (ref 3.87–5.11)
RDW: 18.7 % — ABNORMAL HIGH (ref 11.5–15.5)
WBC: 11.5 K/uL — ABNORMAL HIGH (ref 4.0–10.5)
nRBC: 0 % (ref 0.0–0.2)

## 2024-08-24 MED ORDER — DIPHENHYDRAMINE HCL 50 MG/ML IJ SOLN
50.0000 mg | Freq: Once | INTRAMUSCULAR | Status: AC
  Administered 2024-08-25: 50 mg via INTRAVENOUS
  Filled 2024-08-24 (×2): qty 1

## 2024-08-24 MED ORDER — METHYLPREDNISOLONE SODIUM SUCC 40 MG IJ SOLR
40.0000 mg | Freq: Once | INTRAMUSCULAR | Status: AC
  Administered 2024-08-24: 40 mg via INTRAVENOUS
  Filled 2024-08-24: qty 1

## 2024-08-24 MED ORDER — DIPHENHYDRAMINE HCL 25 MG PO CAPS
50.0000 mg | ORAL_CAPSULE | Freq: Once | ORAL | Status: AC
  Filled 2024-08-24: qty 2

## 2024-08-24 MED ORDER — ACETAMINOPHEN 500 MG PO TABS
1000.0000 mg | ORAL_TABLET | Freq: Once | ORAL | Status: AC
  Administered 2024-08-24: 1000 mg via ORAL
  Filled 2024-08-24: qty 2

## 2024-08-24 MED ORDER — ONDANSETRON 4 MG PO TBDP
4.0000 mg | ORAL_TABLET | Freq: Once | ORAL | Status: AC
  Administered 2024-08-24: 4 mg via ORAL
  Filled 2024-08-24: qty 1

## 2024-08-24 MED ORDER — HYDROCORTISONE 1 % EX CREA
TOPICAL_CREAM | Freq: Once | CUTANEOUS | Status: AC
  Filled 2024-08-24: qty 28

## 2024-08-24 NOTE — ED Notes (Signed)
 Pt. Has noted Redness on the L lower extremity.  Pt. Has been scratching this area and reports it is itching.  Pedal pulse noted in the L foot and R foot.

## 2024-08-24 NOTE — ED Provider Notes (Signed)
 [place] [facility] AT [facility] Provider Note   CSN: [id number] Arrival date & time: 08/24/24  2017     Patient presents with: Leg Swelling and Shortness of Breath   Jaime [name] is a 38 y.o. female who presents emergency department chief complaint of leg pain and shortness of breath.  She has a known DVT.  She states that her leg has been very itchy.  She has been scratching a lot.  She missed several doses of Eliquis  and now became short of breath over the last few days.  She denies hemoptysis or loss of consciousness and palpitations.    Shortness of Breath      Prior to Admission medications   Medication Sig Start Date End Date Taking? Authorizing Provider  acetaminophen  (TYLENOL ) 500 MG tablet Take 1,000 mg by mouth 2 (two) times daily as needed for moderate pain (pain score 4-6) or headache.    [provider]  albuterol  (VENTOLIN  HFA) 108 (90 Base) MCG/ACT inhaler Inhale 2 puffs into the lungs every 6 (six) hours as needed for wheezing or shortness of breath. 07/17/22   Leath-Warren, Etta PARAS, NP  apixaban  (ELIQUIS ) 5 MG TABS tablet Take 2 tablets (10 mg total) by mouth 2 (two) times daily for 6 days, THEN 1 tablet (5 mg total) 2 (two) times daily. 09/28/23 08/17/24  Ricky Fines, MD  apixaban  (ELIQUIS ) 5 MG TABS tablet Take 5 mg by mouth. 07/28/24   [provider]  ARIPiprazole  (ABILIFY ) 20 MG tablet Take 1 tablet (20 mg total) by mouth daily. 05/31/24   Bevely Doffing, FNP  aspirin  81 MG chewable tablet Chew 81 mg by mouth. Patient not taking: Reported on 08/23/2024 06/12/20   [provider]  betamethasone  dipropionate (DIPROLENE ) 0.05 % ointment Apply topically 2 (two) times daily. 07/05/24   Barrett, Warren SAILOR, PA-C  buPROPion (WELLBUTRIN XL) 150 MG 24 hr tablet Take 150 mg by mouth. 06/29/24   [provider]  chlorhexidine  (HIBICLENS ) 4 % external liquid Apply topically daily as needed. 05/13/24   Stuart Vernell Norris, PA-C  clonazePAM  (KLONOPIN ) 0.5 MG tablet Take 0.5 mg by mouth 2 (two) times daily as needed for anxiety. 08/02/23   [provider]  Continuous Glucose Receiver (FREESTYLE LIBRE 3 READER) DEVI Use the Freestyle Libre 3 Reader Device to monitor blood sugars as directed by the provider. 02/29/24   Therisa Benton PARAS, NP  Continuous Glucose Sensor (FREESTYLE LIBRE 3 PLUS SENSOR) MISC USE TO CHECK GLUCOSE CONTINUOUSLY. CHANGE SENSOR EVERY 15 DAYS 08/23/24   Therisa Benton PARAS, NP  doxycycline  (VIBRAMYCIN ) 100 MG capsule Take 1 capsule (100 mg total) by mouth 2 (two) times daily for 10 days. 08/23/24 09/02/24  Mayers, Cari S, PA-C  DULoxetine  (CYMBALTA ) 60 MG capsule Take 60 mg by mouth daily. 07/22/24   [provider]  famotidine  (PEPCID ) 20 MG tablet Take 1 tablet (20 mg total) by mouth at bedtime. 09/28/23   Ricky Fines, MD  ferrous sulfate  325 (65 FE) MG EC tablet Take 1 tablet (325 mg total) by mouth daily with breakfast. 09/13/23   Lamon Herter M, PA-C  fluconazole  (DIFLUCAN ) 150 MG tablet Take 150 mg by mouth once. Patient not taking: Reported on 08/23/2024 07/07/24   [provider]  glucose blood (ACCU-CHEK GUIDE) test strip Use as instructed to check blood glucose four times daily Patient not taking: Reported on 08/17/2024 12/01/21   Therisa Benton PARAS, NP  hydrOXYzine  (ATARAX ) 10 MG tablet Take 1  tablet (10 mg total) by mouth 3 (three) times daily as needed. 05/30/24   Leath-Warren, Etta PARAS, NP  hydrOXYzine  (VISTARIL ) 50 MG capsule Take 1 capsule (50 mg total) by mouth 3 (three) times daily as needed. 05/25/23   Del Orbe Polanco, Iliana, FNP  hyoscyamine  (ANASPAZ ) 0.125 MG TBDP disintergrating tablet Place 1 tablet (0.125 mg total) under the tongue every 6 (six) hours as needed. 12/13/23   Eartha Angelia Sieving, MD  Insulin  Pen Needle (PEN NEEDLES) 32G X 6 MM MISC 1 each by Does not apply route 3 (three) times daily. 02/26/21   Therisa Benton PARAS, NP   insulin  regular human CONCENTRATED (HUMULIN  R U-500 KWIKPEN) 500 UNIT/ML KwikPen Inject 80 Units into the skin 3 (three) times daily with meals. 03/06/24   Therisa Benton PARAS, NP  lidocaine  (LIDODERM ) 5 % Place 1 patch onto the skin daily. Remove & Discard patch within 12 hours or as directed by MD. DO not APPLY TO INFLAMED , RED SKIN Patient not taking: Reported on 08/17/2024 04/12/24   Antonetta Rollene BRAVO, MD  lidocaine  (XYLOCAINE ) 5 % ointment Apply 1 Application topically as needed. Patient not taking: Reported on 08/17/2024 04/27/23   Del Orbe Polanco, Iliana, FNP  lisinopril  (ZESTRIL ) 5 MG tablet Take 5 mg by mouth daily.    [provider]  methadone  (DOLOPHINE ) 10 MG/ML solution Take 105 mg by mouth daily.    [provider]  methocarbamol  (ROBAXIN ) 750 MG tablet Take 1 tablet (750 mg total) by mouth 3 (three) times daily. 07/01/24   Daralene Lonni BIRCH, PA-C  metroNIDAZOLE  (FLAGYL ) 500 MG tablet Take 1 tablet (500 mg total) by mouth 2 (two) times daily. Patient not taking: Reported on 08/23/2024 08/18/24   Barrett, Warren SAILOR, PA-C  MOUNJARO  7.5 MG/0.5ML Pen INJECT 1 PEN SUBCUTANEOUSLY ONCE A WEEK 08/17/24   Reardon, Whitney J, NP  mupirocin  ointment (BACTROBAN ) 2 % Apply 1 Application topically 2 (two) times daily. 05/13/24   Stuart Vernell Norris, PA-C  NARCAN  4 MG/0.1ML LIQD nasal spray kit Place 1 spray into the nose once as needed (OD). Patient not taking: Reported on 08/17/2024 04/07/24   [provider]  nicotine  (NICODERM CQ  - DOSED IN MG/24 HOURS) 14 mg/24hr patch Place 1 patch (14 mg total) onto the skin daily. 07/07/24   Therisa Benton PARAS, NP  nitrofurantoin , macrocrystal-monohydrate, (MACROBID ) 100 MG capsule Take 1 capsule (100 mg total) by mouth 2 (two) times daily for 7 days. 08/17/24 08/24/24  Banister, Pamela K, MD  nystatin  cream (MYCOSTATIN ) Apply 1 Application topically 2 (two) times daily. Patient not taking: Reported on 08/17/2024 10/22/23    Antonetta Rollene BRAVO, MD  Omega-3 Fatty Acids (ENTERIC FISH OIL) 1000 MG CPDR Take 1,000 mg by mouth 3 (three) times daily.    [provider]  omeprazole  (PRILOSEC) 40 MG capsule TAKE 1 CAPSULE BY MOUTH TWICE DAILY. 05/10/23   Carlan, Chelsea L, NP  ondansetron  (ZOFRAN -ODT) 4 MG disintegrating tablet Take 1 tablet (4 mg total) by mouth every 8 (eight) hours as needed for nausea or vomiting. 08/12/24   Reddick, Johnathan B, NP  pantoprazole  (PROTONIX ) 20 MG tablet Take 20 mg by mouth daily. 12/30/23   [provider]  phenazopyridine (PYRIDIUM) 200 MG tablet Take 1 tablet (200 mg total) by mouth 3 (three) times daily. 08/18/24   Barrett, Warren SAILOR, PA-C  potassium chloride  (KLOR-CON ) 10 MEQ tablet Take 10 mEq by mouth 2 (two) times daily. Patient not taking: Reported on 08/23/2024 08/10/23  [provider]  pregabalin  (LYRICA ) 75 MG capsule Take 75 mg by mouth. 07/28/24   [provider]  propranolol  (INDERAL ) 10 MG tablet Take 1 tablet (10 mg total) by mouth 2 (two) times daily. 09/28/23   Ricky Fines, MD  silver  sulfADIAZINE  (SILVADENE ) 1 % cream APPLY  CREAM EXTERNALLY TWICE DAILY 08/24/24   Bacchus, Gloria Z, FNP  SUMAtriptan  (IMITREX ) 50 MG tablet Take 1 tablet (50 mg total) by mouth every 2 (two) hours as needed for migraine or headache. May repeat in 2 hours if headache persists or recurs.  Maximum of 200 mg per 24 hours. Patient not taking: Reported on 08/17/2024 08/12/24   Aurea Goodell B, NP  torsemide  (DEMADEX ) 20 MG tablet Take 2 tablets (40 mg total) by mouth daily. Patient not taking: Reported on 08/17/2024 09/28/23   Ricky Fines, MD  traZODone  (DESYREL ) 150 MG tablet Take 1-2 tablets (150-300 mg total) by mouth at bedtime as needed for sleep. Patient not taking: Reported on 08/17/2024 06/22/24 07/22/24  Jude Harden GAILS, MD  triamcinolone  cream (KENALOG ) 0.5 % Apply topically. Patient not taking: Reported on 08/17/2024 07/28/24 08/27/24  [provider]  venlafaxine  XR (EFFEXOR  XR) 150 MG 24 hr capsule Take 1 capsule (150 mg total) by mouth daily with breakfast. 05/31/24   Bevely Doffing, FNP    Allergies: Firvanq  [vancomycin ], Iodine -131, Ivp dye [iodinated contrast media], Toradol [ketorolac tromethamine], Tylenol  [acetaminophen ], Nsaids, Suboxone  [buprenorphine hcl-naloxone  hcl], and Neurontin  [gabapentin ]    Review of Systems  Respiratory:  Positive for shortness of breath.     Updated Vital Signs BP (!) 132/95   Pulse 85   Temp 98.4 F (36.9 C) (Oral)   LMP 08/16/2024 (Exact Date)   SpO2 99%   Physical Exam Vitals and nursing note reviewed.  Constitutional:      General: She is not in acute distress.    Appearance: She is well-developed. She is not diaphoretic.  HENT:     Head: Normocephalic and atraumatic.     Right Ear: External ear normal.     Left Ear: External ear normal.     Nose: Nose normal.     Mouth/Throat:     Mouth: Mucous membranes are moist.  Eyes:     General: No scleral icterus.    Conjunctiva/sclera: Conjunctivae normal.  Cardiovascular:     Rate and Rhythm: Normal rate and regular rhythm.     Heart sounds: Normal heart sounds. No murmur heard.    No friction rub. No gallop.  Pulmonary:     Effort: Pulmonary effort is normal. No respiratory distress.     Breath sounds: Normal breath sounds.  Abdominal:     General: Bowel sounds are normal. There is no distension.     Palpations: Abdomen is soft. There is no mass.     Tenderness: There is no abdominal tenderness. There is no guarding.  Musculoskeletal:     Cervical back: Normal range of motion.  Skin:    General: Skin is warm and dry.     Comments: Left lower extremity with areas of excoriation, no obvious signs of infection, it is mildly more swollen as compared to the right  Neurological:     Mental Status: She is alert and oriented to person, place, and time.  Psychiatric:        Behavior: Behavior normal.     (all labs  ordered are listed, but only abnormal results are displayed) Labs Reviewed  CBC WITH DIFFERENTIAL/PLATELET - Abnormal; Notable  for the following components:      Result Value   WBC 11.5 (*)    RBC 5.41 (*)    MCV 79.1 (*)    MCH 23.8 (*)    RDW 18.7 (*)    Neutro Abs 9.8 (*)    All other components within normal limits  COMPREHENSIVE METABOLIC PANEL WITH GFR - Abnormal; Notable for the following components:   Glucose, Bld 303 (*)    Calcium  8.7 (*)    Alkaline Phosphatase 152 (*)    All other components within normal limits  PREGNANCY, URINE    EKG: None  Radiology: US  Venous Img Lower Unilateral Left (DVT) Result Date: 08/24/2024 EXAM: ULTRASOUND DUPLEX OF THE LEFT LOWER EXTREMITY VEINS TECHNIQUE: Duplex ultrasound using B-mode/gray scaled imaging and Doppler spectral analysis and color flow was obtained of the deep venous structures of the left lower extremity. COMPARISON: 04/11/2024 CLINICAL HISTORY: DVT (deep venous thrombosis) (HCC) 798266. FINDINGS: No significant change from 04/11/2024. Chronic nonocclusive thrombus is present in the distal femoral and popliteal vein of the left lower extremity. The common femoral vein, saphenofemoral junction, and profunda femoral vein, and calf veins of the left lower extremity demonstrate normal compressibility with normal color flow and spectral analysis. IMPRESSION: 1. 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 Electronically signed by: Norman Gatlin MD 08/24/2024 10:12 PM EDT RP Workstation: [id number]     Procedures   Medications Ordered in the ED  diphenhydrAMINE  (BENADRYL ) capsule 50 mg (has no administration in time range)    Or  diphenhydrAMINE  (BENADRYL ) injection 50 mg (has no administration in time range)  triamcinolone  cream (KENALOG ) 0.1 % cream (has no administration in time range)  methylPREDNISolone  sodium succinate  (SOLU-MEDROL ) 40 mg/mL  injection 40 mg (40 mg Intravenous Given 08/24/24 2255)  acetaminophen  (TYLENOL ) tablet 1,000 mg (1,000 mg Oral Given 08/24/24 2340)  ondansetron  (ZOFRAN -ODT) disintegrating tablet 4 mg (4 mg Oral Given 08/24/24 2349)    Clinical Course as of 08/24/24 2353  Thu Aug 24, 2024  2108 EKG not crossing over into MUSE and shows sinus rhythm, no STEMI or acute abnormalities  [MK]    Clinical Course User Index [MK] Gennaro Duwaine CROME, DO                                 Medical Decision Making Amount and/or Complexity of Data Reviewed Labs: ordered. Radiology: ordered.  Risk Prescription drug management.   38 year old female with a history of diabetes and obesity known DVT.  She had been noncompliant with her Eliquis  and now is having shortness of breath.  Unfortunately patient also has a contrast allergy and will require contrast prep.  Her CT scan ordered, I reviewed her labs which shows mildly elevated white count elevated glucose of 303.  I have also reviewed the patient's DVT study which shows a chronic DVT.  Signout given to Dr. Trine at shift handoff.     Final diagnoses:  None    ED Discharge Orders     None          Arloa Chroman, PA-C 08/24/24 2353    Gennaro Duwaine CROME, DO 08/28/24 [phone]

## 2024-08-24 NOTE — ED Triage Notes (Addendum)
 Pt reports being diagnosed with LLE DVT 3 months ago, prescribed Eliquis , reports missing some doses. Pt to ED today for worseing LLE swelling, redness, and itchiness. Pt L leg does appear red and swollen down to foot. Pt also reports new SOB that started a few days ago.

## 2024-08-24 NOTE — Telephone Encounter (Signed)
 Pharmacy Patient Advocate Encounter  Received notification from [facility] MEDICAID that Prior Authorization for Mounjaro  7.5MG /0.5ML auto-injectors  has been APPROVED from 08/23/24 to 08/23/25. Ran test claim, Copay is $0.00. This test claim was processed through [facility]- copay amounts may vary at other pharmacies due to pharmacy/plan contracts, or as the patient moves through the different stages of their insurance plan.   PA #/Case ID/Reference #: [id number]

## 2024-08-25 ENCOUNTER — Emergency Department (HOSPITAL_BASED_OUTPATIENT_CLINIC_OR_DEPARTMENT_OTHER): Payer: MEDICAID

## 2024-08-25 LAB — PREGNANCY, URINE: Preg Test, Ur: NEGATIVE

## 2024-08-25 MED ORDER — IOHEXOL 350 MG/ML SOLN
100.0000 mL | Freq: Once | INTRAVENOUS | Status: AC | PRN
  Administered 2024-08-25: 100 mL via INTRAVENOUS

## 2024-08-25 NOTE — ED Provider Notes (Signed)
 I assumed care of this patient from previous provider.  Please see their note for further details of history, exam, and MDM.   Briefly patient is a 38 y.o. female who presented leg pain and SOB, pending CTA to rule out PE given known DVT and inconsistent AC use.    CTA negative.  The patient appears reasonably screened and/or stabilized for discharge and I doubt any other medical condition or other [facility] requiring further screening, evaluation, or treatment in the ED at this time. I have discussed the findings, Dx and Tx plan with the patient/family who expressed understanding and agree(s) with the plan. Discharge instructions discussed at length. The patient/family was given strict return precautions who verbalized understanding of the instructions. No further questions at time of discharge.  Disposition: Discharge  Condition: Good  ED Discharge Orders     None         Follow Up: Del Wilhelmena Lloyd Sola, FNP [phone] [address]. [address] [place] [address] [phone]  Call  to schedule an appointment for close follow up      Honey Zakarian, Raynell Moder, MD 08/25/24 [phone]

## 2024-09-01 ENCOUNTER — Other Ambulatory Visit (HOSPITAL_COMMUNITY): Payer: Self-pay

## 2024-09-25 ENCOUNTER — Encounter (HOSPITAL_COMMUNITY): Payer: Self-pay

## 2024-09-25 ENCOUNTER — Other Ambulatory Visit: Payer: Self-pay

## 2024-09-25 ENCOUNTER — Ambulatory Visit
Admission: EM | Admit: 2024-09-25 | Discharge: 2024-09-25 | Disposition: A | Discharge status: Home / Self Care | Payer: MEDICAID | Attending: Student | Admitting: Student

## 2024-09-25 ENCOUNTER — Emergency Department (HOSPITAL_COMMUNITY): Payer: MEDICAID

## 2024-09-25 ENCOUNTER — Emergency Department (HOSPITAL_COMMUNITY)
Admission: EM | Admit: 2024-09-25 | Discharge: 2024-09-25 | Disposition: A | Discharge status: Home / Self Care | Payer: MEDICAID | Source: Ambulatory Visit | Attending: Emergency Medicine | Admitting: Emergency Medicine

## 2024-09-25 DIAGNOSIS — R399 Unspecified symptoms and signs involving the genitourinary system: Secondary | ICD-10-CM | POA: Diagnosis not present

## 2024-09-25 DIAGNOSIS — R197 Diarrhea, unspecified: Secondary | ICD-10-CM | POA: Diagnosis not present

## 2024-09-25 DIAGNOSIS — R1032 Left lower quadrant pain: Secondary | ICD-10-CM | POA: Diagnosis not present

## 2024-09-25 DIAGNOSIS — E1165 Type 2 diabetes mellitus with hyperglycemia: Secondary | ICD-10-CM | POA: Insufficient documentation

## 2024-09-25 DIAGNOSIS — Z7901 Long term (current) use of anticoagulants: Secondary | ICD-10-CM | POA: Insufficient documentation

## 2024-09-25 DIAGNOSIS — L03116 Cellulitis of left lower limb: Secondary | ICD-10-CM | POA: Diagnosis not present

## 2024-09-25 DIAGNOSIS — Z794 Long term (current) use of insulin: Secondary | ICD-10-CM | POA: Diagnosis not present

## 2024-09-25 DIAGNOSIS — M7989 Other specified soft tissue disorders: Secondary | ICD-10-CM | POA: Diagnosis not present

## 2024-09-25 DIAGNOSIS — R112 Nausea with vomiting, unspecified: Secondary | ICD-10-CM | POA: Diagnosis present

## 2024-09-25 DIAGNOSIS — J4489 Other specified chronic obstructive pulmonary disease: Secondary | ICD-10-CM | POA: Insufficient documentation

## 2024-09-25 DIAGNOSIS — N3000 Acute cystitis without hematuria: Secondary | ICD-10-CM

## 2024-09-25 LAB — CBC
HCT: 45.4 % (ref 36.0–46.0)
Hemoglobin: 15.1 g/dL — ABNORMAL HIGH (ref 12.0–15.0)
MCH: 30 pg (ref 26.0–34.0)
MCHC: 33.3 g/dL (ref 30.0–36.0)
MCV: 90.3 fL (ref 80.0–100.0)
Platelets: 104 K/uL — ABNORMAL LOW (ref 150–400)
RBC: 5.03 MIL/uL (ref 3.87–5.11)
RDW: 12.9 % (ref 11.5–15.5)
WBC: 4.3 K/uL (ref 4.0–10.5)
nRBC: 0 % (ref 0.0–0.2)

## 2024-09-25 LAB — HCG, SERUM, QUALITATIVE: Preg, Serum: NEGATIVE

## 2024-09-25 LAB — COMPREHENSIVE METABOLIC PANEL WITH GFR
ALT: 24 U/L (ref 0–44)
AST: 27 U/L (ref 15–41)
Albumin: 3.6 g/dL (ref 3.5–5.0)
Alkaline Phosphatase: 164 U/L — ABNORMAL HIGH (ref 38–126)
Anion gap: 8 (ref 5–15)
BUN: 7 mg/dL (ref 6–20)
CO2: 26 mmol/L (ref 22–32)
Calcium: 9 mg/dL (ref 8.9–10.3)
Chloride: 101 mmol/L (ref 98–111)
Creatinine, Ser: 0.64 mg/dL (ref 0.44–1.00)
GFR, Estimated: >60 mL/min (ref >60)
Glucose, Bld: 291 mg/dL — ABNORMAL HIGH (ref 70–99)
Potassium: 4.4 mmol/L (ref 3.5–5.1)
Sodium: 136 mmol/L (ref 135–145)
Total Bilirubin: 0.7 mg/dL (ref 0.0–1.2)
Total Protein: 7 g/dL (ref 6.5–8.1)

## 2024-09-25 LAB — URINALYSIS, ROUTINE W REFLEX MICROSCOPIC
Bacteria, UA: NONE SEEN
Bilirubin Urine: NEGATIVE
Glucose, UA: >=500 mg/dL — AB
Hgb urine dipstick: NEGATIVE
Ketones, ur: NEGATIVE mg/dL
Nitrite: NEGATIVE
Protein, ur: NEGATIVE mg/dL
Specific Gravity, Urine: 1.028 (ref 1.005–1.030)
pH: 6 (ref 5.0–8.0)

## 2024-09-25 LAB — POCT URINE DIPSTICK
Bilirubin, UA: NEGATIVE
Glucose, UA: >=1000 mg/dL — AB
Ketones, POC UA: NEGATIVE mg/dL
Nitrite, UA: POSITIVE — AB
POC PROTEIN,UA: NEGATIVE
Spec Grav, UA: 1.015 (ref 1.010–1.025)
Urobilinogen, UA: 1 U/dL
pH, UA: 6 (ref 5.0–8.0)

## 2024-09-25 LAB — LIPASE, BLOOD: Lipase: 13 U/L (ref 11–51)

## 2024-09-25 LAB — GLUCOSE, POCT (MANUAL RESULT ENTRY): POC Glucose: 336 mg/dL — AB (ref 70–99)

## 2024-09-25 LAB — POCT URINE PREGNANCY: Preg Test, Ur: NEGATIVE

## 2024-09-25 MED ORDER — ONDANSETRON 4 MG PO TBDP: ORAL_TABLET | ORAL | 0 refills | Status: DC

## 2024-09-25 MED ORDER — ONDANSETRON HCL 4 MG/2ML IJ SOLN
4.0000 mg | Freq: Once | INTRAMUSCULAR | Status: AC
  Administered 2024-09-25: 4 mg via INTRAVENOUS
  Filled 2024-09-25: qty 2

## 2024-09-25 MED ORDER — FENTANYL CITRATE (PF) 50 MCG/ML IJ SOSY
50.0000 ug | PREFILLED_SYRINGE | Freq: Once | INTRAMUSCULAR | Status: AC
  Administered 2024-09-25: 50 ug via INTRAVENOUS
  Filled 2024-09-25: qty 1

## 2024-09-25 MED ORDER — SODIUM CHLORIDE 0.9 % IV BOLUS
1000.0000 mL | Freq: Once | INTRAVENOUS | Status: AC
  Administered 2024-09-25: 1000 mL via INTRAVENOUS

## 2024-09-25 MED ORDER — CEPHALEXIN 500 MG PO CAPS: 500.0000 mg | ORAL_CAPSULE | Freq: Two times a day (BID) | ORAL | 0 refills | Status: DC

## 2024-09-25 MED ORDER — TRIAMCINOLONE ACETONIDE 0.5 % EX OINT: 1.0000 | TOPICAL_OINTMENT | Freq: Two times a day (BID) | CUTANEOUS | 0 refills | Status: AC

## 2024-09-25 MED ORDER — MUPIROCIN 2 % EX OINT: 1.0000 | TOPICAL_OINTMENT | Freq: Two times a day (BID) | CUTANEOUS | 0 refills | Status: AC

## 2024-09-25 NOTE — ED Provider Notes (Signed)
 [facility]    CSN: [id number] Arrival date & time: 09/25/24  1236      History   Chief Complaint Chief Complaint  Patient presents with   Diarrhea   Emesis   Leg Pain   Urinary Frequency    HPI BRIAUNA GILMARTIN is a 38 y.o. female presenting with: Pt presents with a chief complaint of urinary frequency. Day 2 of symptoms. Burns to urinate. Has taken OTC AZO tablets with temporary relief/improvement. Has been drinking cranberry juice to help symptoms, no noticeable improvement.    This is accompanied with n/v/d. Ate a BBQ sandwich and believes she may have gotten food poisoning. Rates overall pain an 8/10.    Pt is also reporting 8/10 left lower leg pain x 3-4 months. States it is itching and there is swelling present. Has been dx with cellulitis in the past. Antibiotics prescribed did not help at the time. History DVT, for which she takes Eliquis . Notes L leg cellulitis for months; has completed keflex  and doxycycline  for this.      HPI  Past Medical History:  Diagnosis Date   Acute encephalopathy 08/31/2020   Adjustment disorder with depressed mood 09/07/2020   Anxiety    Asthma    Chronic abdominal pain    Chronic back pain    Chronic prescription opiate use 02/16/2022   Cirrhosis (HCC)    Cirrhosis (HCC)    COPD (chronic obstructive pulmonary disease) (HCC)    Depression    Diabetes mellitus without complication (HCC)    Diabetes mellitus, type II (HCC)    Diverticulosis    DVT (deep venous thrombosis) (HCC) 02/04/2023   taking lovenox  prior to procedure   Fatty liver 03/07/2020   GERD (gastroesophageal reflux disease)    Hepatitis C    Hernia, abdominal    Hyperglycemia due to type 2 diabetes mellitus (HCC) 05/24/2020   Hyperlipidemia    IBS (irritable bowel syndrome) 02/16/2022   Insomnia    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)    Lupus    Migraine headache    Morbid obesity (HCC) 05/24/2020   Neuropathy     Nocturnal seizures (HCC)    Pain of upper abdomen 07/28/2021   Peptic ulcer    Spleen enlarged    Splenomegaly     Patient Active Problem List   Diagnosis Date Noted   Cellulitis 07/26/2024   Chronic deep vein thrombosis (DVT) of popliteal vein of left lower extremity (HCC) 07/26/2024   History of hepatitis C 07/26/2024   History of IBS 07/26/2024   Hypomagnesemia 07/26/2024   At risk for sleep apnea 05/17/2024   Left leg pain 05/15/2024   Encounter for examination following treatment at hospital 05/15/2024   Hx of bipolar disorder 04/05/2024   Lumbar pain 12/09/2023   Cellulitis and abscess of buttock 10/22/2023   Type 2 diabetes mellitus with other specified complication (HCC) 10/22/2023   Encounter for IUD insertion 10/06/2023   Hospital discharge follow-up 10/05/2023   Depression, major, single episode, severe (HCC) 10/05/2023   Acute pulmonary embolism (HCC) 09/25/2023   Elevated d-dimer 09/16/2023   Elevated brain natriuretic peptide (BNP) level 09/16/2023   Hypoalbuminemia due to protein-calorie malnutrition 09/16/2023   Acute deep vein thrombosis (DVT) of left lower extremity (HCC) 09/15/2023   Cellulitis of left lower extremity 09/14/2023   Deep vein thrombophlebitis of left leg (HCC) 08/26/2023   Lumbar compression fracture (HCC) 02/10/2023   Left leg DVT (HCC) 02/05/2023  At risk for arrhythmia 01/26/2023   Chronic back pain 01/01/2023   Insomnia 01/01/2023   Depression, recurrent 01/01/2023   Constipation 05/18/2022   IBS (irritable bowel syndrome) 02/16/2022   Chronic prescription opiate use 02/16/2022   Gastroesophageal reflux disease without esophagitis 10/23/2021   Nausea and vomiting 10/23/2021   Diarrhea 10/23/2021   Pain of upper abdomen 07/28/2021   Adjustment disorder with depressed mood 09/07/2020   Dyspnea 07/29/2020   Anasarca 05/24/2020   Hyperglycemia due to type 2 diabetes mellitus (HCC) 05/24/2020   Morbid obesity (HCC) 05/24/2020    Elevated serum immunoglobulin free light chains 03/08/2020   Fatty liver 03/07/2020   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   Splenomegaly 03/07/2020   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   Pressure injury of skin 01/29/2019   Uncontrolled diabetes mellitus 09/19/2018   Hyperglycemia 09/17/2018   Acute respiratory failure with hypoxia (HCC) 09/15/2018   Anxiety 09/15/2018   Chronic abdominal pain 09/15/2018   Diabetes mellitus without complication (HCC) 09/15/2018   Hepatitis C 09/15/2018   Peptic ulcer 09/15/2018   Cirrhosis (HCC) 09/15/2018   Tobacco use disorder 05/22/2018   Opioid use disorder 05/02/2018   Type 2 diabetes mellitus with diabetic neuropathy, with long-term current use of insulin  (HCC) 05/02/2018   Mood disorder 05/02/2018   CAP (community acquired pneumonia) 07/17/2016   Chronic pain disorder 07/17/2016   Depression 05/24/2013    Past Surgical History:  Procedure Laterality Date   BIOPSY  11/25/2021   Procedure: BIOPSY;  Surgeon: Eartha Angelia Sieving, MD;  Location: [facility];  Service: Gastroenterology;;   CHOLECYSTECTOMY     COLONOSCOPY WITH PROPOFOL  N/A 11/25/2021   Procedure: COLONOSCOPY WITH PROPOFOL ;  Surgeon: Eartha Angelia Sieving, MD;  Location: [facility];  Service: Gastroenterology;  Laterality: N/A;  805   COLONOSCOPY WITH PROPOFOL  N/A 03/31/2022   Procedure: COLONOSCOPY WITH PROPOFOL ;  Surgeon: Eartha Angelia Sieving, MD;  Location: [facility];  Service: Gastroenterology;  Laterality: N/A;  730   ESOPHAGOGASTRODUODENOSCOPY  06/2020   done at [facility], candida in upper esophagus (treated with diflucan ), ulcerative esophagitis at GE junction, gastritis in stomach, single ulcer in duodenal bulb, with duodenal mucosa showing no abnormality. No presence of varices   ESOPHAGOGASTRODUODENOSCOPY (EGD) WITH PROPOFOL  N/A 11/25/2021   Procedure: ESOPHAGOGASTRODUODENOSCOPY (EGD) WITH PROPOFOL ;  Surgeon: Eartha Angelia Sieving, MD;   Location: [facility];  Service: Gastroenterology;  Laterality: N/A;   ESOPHAGOGASTRODUODENOSCOPY (EGD) WITH PROPOFOL  N/A 03/31/2022   Procedure: ESOPHAGOGASTRODUODENOSCOPY (EGD) WITH PROPOFOL ;  Surgeon: Eartha Angelia Sieving, MD;  Location: [facility];  Service: Gastroenterology;  Laterality: N/A;   IVC FILTER REMOVAL N/A 09/16/2023   Procedure: IVC FILTER REMOVAL;  Surgeon: Pearline Norman RAMAN, MD;  Location: [facility] INVASIVE CV LAB;  Service: Cardiovascular;  Laterality: N/A;   IVC FILTER REMOVAL N/A 02/14/2024   Procedure: IVC FILTER REMOVAL;  Surgeon: Pearline Norman RAMAN, MD;  Location: [facility] INVASIVE CV LAB;  Service: Cardiovascular;  Laterality: N/A;   PERIPHERAL VASCULAR THROMBECTOMY Left 09/16/2023   Procedure: PERIPHERAL VASCULAR THROMBECTOMY;  Surgeon: Pearline Norman RAMAN, MD;  Location: [facility] INVASIVE CV LAB;  Service: Cardiovascular;  Laterality: Left;    OB History     Gravida  0   Para  0   Term  0   Preterm  0   AB  0   Living  0      SAB  0   IAB  0   Ectopic  0   Multiple  0   Live  Births  0            Home Medications    Prior to Admission medications   Medication Sig Start Date End Date Taking? Authorizing Provider  acetaminophen  (TYLENOL ) 500 MG tablet Take 1,000 mg by mouth 2 (two) times daily as needed for moderate pain (pain score 4-6) or headache.    [provider]  albuterol  (VENTOLIN  HFA) 108 (90 Base) MCG/ACT inhaler Inhale 2 puffs into the lungs every 6 (six) hours as needed for wheezing or shortness of breath. 07/17/22   Leath-Warren, Etta PARAS, NP  apixaban  (ELIQUIS ) 5 MG TABS tablet Take 2 tablets (10 mg total) by mouth 2 (two) times daily for 6 days, THEN 1 tablet (5 mg total) 2 (two) times daily. 09/28/23 08/17/24  Ricky Fines, MD  apixaban  (ELIQUIS ) 5 MG TABS tablet Take 5 mg by mouth. 07/28/24   [provider]  ARIPiprazole  (ABILIFY ) 20 MG tablet Take 1 tablet (20 mg total) by mouth daily. 05/31/24   Bevely Doffing, FNP   aspirin  81 MG chewable tablet Chew 81 mg by mouth. Patient not taking: Reported on 08/23/2024 06/12/20   [provider]  betamethasone  dipropionate (DIPROLENE ) 0.05 % ointment Apply topically 2 (two) times daily. 07/05/24   Barrett, Warren SAILOR, PA-C  buPROPion (WELLBUTRIN XL) 150 MG 24 hr tablet Take 150 mg by mouth. 06/29/24   [provider]  chlorhexidine  (HIBICLENS ) 4 % external liquid Apply topically daily as needed. 05/13/24   Stuart Vernell Norris, PA-C  clonazePAM  (KLONOPIN ) 0.5 MG tablet Take 0.5 mg by mouth 2 (two) times daily as needed for anxiety. 08/02/23   [provider]  Continuous Glucose Receiver (FREESTYLE LIBRE 3 READER) DEVI Use the Freestyle Libre 3 Reader Device to monitor blood sugars as directed by the provider. 02/29/24   Therisa Benton PARAS, NP  Continuous Glucose Sensor (FREESTYLE LIBRE 3 PLUS SENSOR) MISC USE TO CHECK GLUCOSE CONTINUOUSLY. CHANGE SENSOR EVERY 15 DAYS 08/23/24   Therisa Benton PARAS, NP  DULoxetine  (CYMBALTA ) 60 MG capsule Take 60 mg by mouth daily. 07/22/24   [provider]  famotidine  (PEPCID ) 20 MG tablet Take 1 tablet (20 mg total) by mouth at bedtime. 09/28/23   Ricky Fines, MD  ferrous sulfate  325 (65 FE) MG EC tablet Take 1 tablet (325 mg total) by mouth daily with breakfast. 09/13/23   Lamon Herter M, PA-C  fluconazole  (DIFLUCAN ) 150 MG tablet Take 150 mg by mouth once. Patient not taking: Reported on 08/23/2024 07/07/24   [provider]  glucose blood (ACCU-CHEK GUIDE) test strip Use as instructed to check blood glucose four times daily Patient not taking: Reported on 08/17/2024 12/01/21   Therisa Benton PARAS, NP  hydrOXYzine  (ATARAX ) 10 MG tablet Take 1 tablet (10 mg total) by mouth 3 (three) times daily as needed. 05/30/24   Leath-Warren, Etta PARAS, NP  hydrOXYzine  (VISTARIL ) 50 MG capsule Take 1 capsule (50 mg total) by mouth 3 (three) times daily as needed. 05/25/23   Del Wilhelmena Lloyd Sola, FNP   hyoscyamine  (ANASPAZ ) 0.125 MG TBDP disintergrating tablet Place 1 tablet (0.125 mg total) under the tongue every 6 (six) hours as needed. 12/13/23   Eartha Angelia Sieving, MD  Insulin  Pen Needle (PEN NEEDLES) 32G X 6 MM MISC 1 each by Does not apply route 3 (three) times daily. 02/26/21   Therisa Benton PARAS, NP  insulin  regular human CONCENTRATED (HUMULIN  R U-500 KWIKPEN) 500 UNIT/ML KwikPen Inject 80 Units into the skin 3 (three) times  daily with meals. 03/06/24   Therisa Benton PARAS, NP  lidocaine  (LIDODERM ) 5 % Place 1 patch onto the skin daily. Remove & Discard patch within 12 hours or as directed by MD. DO not APPLY TO INFLAMED , RED SKIN Patient not taking: Reported on 08/17/2024 04/12/24   Antonetta Rollene BRAVO, MD  lidocaine  (XYLOCAINE ) 5 % ointment Apply 1 Application topically as needed. Patient not taking: Reported on 08/17/2024 04/27/23   Del Orbe Polanco, Iliana, FNP  lisinopril  (ZESTRIL ) 5 MG tablet Take 5 mg by mouth daily.    [provider]  methadone  (DOLOPHINE ) 10 MG/ML solution Take 105 mg by mouth daily.    [provider]  methocarbamol  (ROBAXIN ) 750 MG tablet Take 1 tablet (750 mg total) by mouth 3 (three) times daily. 07/01/24   Daralene Lonni BIRCH, PA-C  metroNIDAZOLE  (FLAGYL ) 500 MG tablet Take 1 tablet (500 mg total) by mouth 2 (two) times daily. Patient not taking: Reported on 08/23/2024 08/18/24   Barrett, Warren SAILOR, PA-C  MOUNJARO  7.5 MG/0.5ML Pen INJECT 1 PEN SUBCUTANEOUSLY ONCE A WEEK 08/17/24   Reardon, Whitney J, NP  mupirocin  ointment (BACTROBAN ) 2 % Apply 1 Application topically 2 (two) times daily. 05/13/24   Stuart Vernell Norris, PA-C  NARCAN  4 MG/0.1ML LIQD nasal spray kit Place 1 spray into the nose once as needed (OD). Patient not taking: Reported on 08/17/2024 04/07/24   [provider]  nicotine  (NICODERM CQ  - DOSED IN MG/24 HOURS) 14 mg/24hr patch Place 1 patch (14 mg total) onto the skin daily. 07/07/24   Therisa Benton PARAS, NP   nystatin  cream (MYCOSTATIN ) Apply 1 Application topically 2 (two) times daily. Patient not taking: Reported on 08/17/2024 10/22/23   Antonetta Rollene BRAVO, MD  Omega-3 Fatty Acids (ENTERIC FISH OIL) 1000 MG CPDR Take 1,000 mg by mouth 3 (three) times daily.    [provider]  omeprazole  (PRILOSEC) 40 MG capsule TAKE 1 CAPSULE BY MOUTH TWICE DAILY. 05/10/23   Carlan, Chelsea L, NP  ondansetron  (ZOFRAN -ODT) 4 MG disintegrating tablet Take 1 tablet (4 mg total) by mouth every 8 (eight) hours as needed for nausea or vomiting. 08/12/24   Reddick, Johnathan B, NP  pantoprazole  (PROTONIX ) 20 MG tablet Take 20 mg by mouth daily. 12/30/23   [provider]  phenazopyridine  (PYRIDIUM ) 200 MG tablet Take 1 tablet (200 mg total) by mouth 3 (three) times daily. 08/18/24   Barrett, Warren SAILOR, PA-C  potassium chloride  (KLOR-CON ) 10 MEQ tablet Take 10 mEq by mouth 2 (two) times daily. Patient not taking: Reported on 08/23/2024 08/10/23   [provider]  pregabalin  (LYRICA ) 75 MG capsule Take 75 mg by mouth. 07/28/24   [provider]  propranolol  (INDERAL ) 10 MG tablet Take 1 tablet (10 mg total) by mouth 2 (two) times daily. 09/28/23   Ricky Fines, MD  silver  sulfADIAZINE  (SILVADENE ) 1 % cream APPLY  CREAM EXTERNALLY TWICE DAILY 08/24/24   Bacchus, Gloria Z, FNP  SUMAtriptan  (IMITREX ) 50 MG tablet Take 1 tablet (50 mg total) by mouth every 2 (two) hours as needed for migraine or headache. May repeat in 2 hours if headache persists or recurs.  Maximum of 200 mg per 24 hours. Patient not taking: Reported on 08/17/2024 08/12/24   Reddick, Johnathan B, NP  torsemide  (DEMADEX ) 20 MG tablet Take 2 tablets (40 mg total) by mouth daily. Patient not taking: Reported on 08/17/2024 09/28/23   Ricky Fines, MD  traZODone  (DESYREL ) 150 MG tablet Take 1-2 tablets (150-300 mg  total) by mouth at bedtime as needed for sleep. Patient not taking: Reported on 08/17/2024 06/22/24 07/22/24  Jude Harden GAILS, MD  venlafaxine  XR (EFFEXOR  XR) 150 MG 24 hr capsule Take 1 capsule (150 mg total) by mouth daily with breakfast. 05/31/24   Bevely Doffing, FNP    Family History Family History  Problem Relation Age of Onset   Hyperlipidemia Maternal Grandfather     Social History Social History   Tobacco Use   Smoking status: Every Day    Current packs/day: 0.50    Types: Cigarettes    Passive exposure: Current   Smokeless tobacco: Never  Vaping Use   Vaping status: Some Days   Substances: Nicotine , Flavoring  Substance Use Topics   Alcohol use: No   Drug use: Not Currently     Allergies   Firvanq  [vancomycin ], Iodine -131, Ivp dye [iodinated contrast media], Toradol [ketorolac tromethamine], Tylenol  [acetaminophen ], Nsaids, Suboxone  [buprenorphine hcl-naloxone  hcl], and Neurontin  [gabapentin ]   Review of Systems Review of Systems  Gastrointestinal:  Positive for nausea and vomiting.  Genitourinary:  Positive for frequency.  Skin:        Cellulitis     Physical Exam Triage Vital Signs ED Triage Vitals [09/25/24 1257]  Encounter Vitals Group     BP      Girls Systolic BP Percentile      Girls Diastolic BP Percentile      Boys Systolic BP Percentile      Boys Diastolic BP Percentile      Pulse      Resp      Temp      Temp src      SpO2      Weight 213 lb (96.6 kg)     Height 5' 10 (1.778 m)     Head Circumference      Peak Flow      Pain Score      Pain Loc      Pain Education      Exclude from Growth Chart    No data found.  Updated Vital Signs Ht 5' 10 (1.778 m)   Wt 213 lb (96.6 kg)   BMI 30.56 kg/m   Visual Acuity Right Eye Distance:   Left Eye Distance:   Bilateral Distance:    Right Eye Near:   Left Eye Near:    Bilateral Near:     Physical Exam Vitals reviewed.  Constitutional:      General: She is not in acute distress.    Appearance: Normal appearance. She is not ill-appearing.  HENT:     Head: Normocephalic and atraumatic.   Pulmonary:     Effort: Pulmonary effort is normal.  Skin:    Comments: L calf and shin is encompassed by erythema warmth and induration. Calves are equal and symmetric.  Neurological:     General: No focal deficit present.     Mental Status: She is alert and oriented to person, place, and time.  Psychiatric:        Mood and Affect: Mood normal.        Behavior: Behavior normal.        Thought Content: Thought content normal.        Judgment: Judgment normal.      UC Treatments / Results  Labs (all labs ordered are listed, but only abnormal results are displayed) Labs Reviewed  POCT URINE DIPSTICK - Abnormal; Notable for the following components:      Result Value  Clarity, UA cloudy (*)    Glucose, UA >=1,000 (*)    Blood, UA trace-intact (*)    Nitrite, UA Positive (*)    Leukocytes, UA Small (1+) (*)    All other components within normal limits  POCT URINE PREGNANCY - Normal    EKG   Radiology No results found.  Procedures Procedures (including critical care time)  Medications Ordered in UC Medications - No data to display  Initial Impression / Assessment and Plan / UC Course  I have reviewed the triage vital signs and the nursing notes.  Pertinent labs & imaging results that were available during my care of the patient were reviewed by me and considered in my medical decision making (see chart for details).  Clinical Course as of 09/25/24 1308  Mon Sep 25, 2024  1308 POC Glucose(!): 336 [LG]    Clinical Course User Index [LG] Arlyss Leita BRAVO, PA-C    Patient is a pleasant 38 year old female presenting with cellulitis of the left lower extremity, urinary symptoms, and nausea with vomiting.  She is afebrile and nontachycardic.  Nonfasting POC CBG 336. UA with small blood, positive nitrite, trace leuk. Has taken Azo in the last 48 hours.  Urine pregnancy negative.  For the cellulitis, has failed treatment with doxycycline  and Keflex .  Though her vital  signs are within normal limits, I am concerned with the extent of infection that she may require IV antibiotics.  Additionally, she may need stat lab work like CBC. Stable for transport to ER in POV. Patient is in agreement.   Final Clinical Impressions(s) / UC Diagnoses   Final diagnoses:  Urinary symptom or sign   Discharge Instructions   None    ED Prescriptions   None    PDMP not reviewed this encounter.   Arlyss Leita BRAVO, PA-C 09/25/24 1318

## 2024-09-25 NOTE — ED Notes (Signed)
 Patient is being discharged from the Urgent Care and sent to the Emergency Department via private vehicle with friend . Per Leita Molly PA, patient is in need of higher level of care due to cellulitis, n/v/d, BG level. Patient is aware and verbalizes understanding of plan of care.  Vitals:   09/25/24 1258  BP: 103/73  Pulse: 83  Resp: 18  Temp: 98.1 F (36.7 C)  SpO2: 95%

## 2024-09-25 NOTE — ED Triage Notes (Signed)
 PT arrives via POV. PT reports abdominal pain, nausea, vomiting, and dysuria. She also is concerned she may have cellulitis to left leg from scratching her leg. States she has a known blood clot in the left leg and is taking her eliquis  as prescribed. PT denies cp or sob. She is AxOx4.

## 2024-09-25 NOTE — Discharge Instructions (Addendum)
 Eat a bland diet for the next few days.  Keep your leg elevated is much as possible.  Use both the steroid cream and the antibiotic cream to the leg twice daily.  Return to the emergency room if you have any worsening symptoms including ongoing vomiting or diarrhea, worsening abdominal pain, fevers, worsening leg pain or swelling or other worsening symptoms.  An ultrasound has been ordered for tomorrow.  They should call you to let you know when to come in.  Your urine did show signs of infection and you were given a prescription for an antibiotic for this.

## 2024-09-25 NOTE — ED Triage Notes (Signed)
 Pt presents with a chief complaint of urinary frequency. Day 2 of symptoms. Burns to urinate. Has taken OTC AZO tablets with temporary relief/improvement. Has been drinking cranberry juice to help symptoms, no noticeable improvement.   This is accompanied with n/v/d. Ate a BBQ sandwich and believes she may have gotten food poisoning. Rates overall pain an 8/10.   Pt is also reporting 8/10 left lower leg pain x 3-4 months. States it is itching and there is swelling present. Has been dx with cellulitis in the past. Antibiotics prescribed did not help at the time.

## 2024-09-25 NOTE — ED Notes (Signed)
 Pt offered crackers and gingerale, tolerated well with no nausea vomiting

## 2024-09-25 NOTE — ED Provider Notes (Signed)
 [place] EMERGENCY DEPARTMENT AT [facility] Provider Note   CSN: [id number] Arrival date & time: 09/25/24  1339     Patient presents with: Abdominal Pain, Emesis, Dysuria, and Cellulitis   Jaime [name] is a 38 y.o. female.   Patient is a 38 year old female who presents with nausea vomiting diarrhea.  She says that it started yesterday.  She thinks it started after she ate a barbecue sandwich.  Her emesis is nonbloody and nonbilious.  Her diarrhea is watery and nonbloody.  She does have some pain in her abdomen, mostly in the middle of her abdomen.  No known fevers.  No known sick contacts.  She is having some urinary symptoms with frequency and a little bit of burning.  She also has some redness to her left lower leg.  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  She is on Eliquis  and she reports that she is compliant with Eliquis .  She says she scratches it a lot because it gets itchy.  She was admitted in September for cellulitis of the leg.  She says it continues to be erythematous although she says is not getting any worse.  She says it has been that same color for the last few months.  She notes that the swelling is stable.  She has some chronic swelling in the leg which is unchanged from her baseline.  No known fevers.       Prior to Admission medications   Medication Sig Start Date End Date Taking? Authorizing Provider  mupirocin  ointment (BACTROBAN ) 2 % Apply 1 Application topically 2 (two) times daily. 09/25/24  Yes Lenor Hollering, MD  ondansetron  (ZOFRAN -ODT) 4 MG disintegrating tablet 4mg  ODT q4 hours prn nausea/vomit 09/25/24  Yes Lenor Hollering, MD  triamcinolone  ointment (KENALOG ) 0.5 % Apply 1 Application topically 2 (two) times daily. 09/25/24  Yes Lenor Hollering, MD  acetaminophen  (TYLENOL ) 500 MG tablet Take 1,000 mg by mouth 2 (two) times daily as needed for moderate pain (pain score 4-6) or headache.    [provider]  albuterol  (VENTOLIN   HFA) 108 (90 Base) MCG/ACT inhaler Inhale 2 puffs into the lungs every 6 (six) hours as needed for wheezing or shortness of breath. 07/17/22   Leath-Warren, Etta PARAS, NP  apixaban  (ELIQUIS ) 5 MG TABS tablet Take 2 tablets (10 mg total) by mouth 2 (two) times daily for 6 days, THEN 1 tablet (5 mg total) 2 (two) times daily. 09/28/23 08/17/24  Ricky Fines, MD  apixaban  (ELIQUIS ) 5 MG TABS tablet Take 5 mg by mouth. 07/28/24   [provider]  ARIPiprazole  (ABILIFY ) 20 MG tablet Take 1 tablet (20 mg total) by mouth daily. 05/31/24   Bevely Doffing, FNP  aspirin  81 MG chewable tablet Chew 81 mg by mouth. Patient not taking: Reported on 08/23/2024 06/12/20   [provider]  betamethasone  dipropionate (DIPROLENE ) 0.05 % ointment Apply topically 2 (two) times daily. 07/05/24   Barrett, Warren SAILOR, PA-C  buPROPion (WELLBUTRIN XL) 150 MG 24 hr tablet Take 150 mg by mouth. 06/29/24   [provider]  chlorhexidine  (HIBICLENS ) 4 % external liquid Apply topically daily as needed. 05/13/24   Stuart Vernell Norris, PA-C  clonazePAM  (KLONOPIN ) 0.5 MG tablet Take 0.5 mg by mouth 2 (two) times daily as needed for anxiety. 08/02/23   [provider]  Continuous Glucose Receiver (FREESTYLE LIBRE 3 READER) DEVI Use the Freestyle Libre 3 Reader Device to monitor blood sugars as directed by the provider.  02/29/24   Therisa Benton PARAS, NP  Continuous Glucose Sensor (FREESTYLE LIBRE 3 PLUS SENSOR) MISC USE TO CHECK GLUCOSE CONTINUOUSLY. CHANGE SENSOR EVERY 15 DAYS 08/23/24   Therisa Benton PARAS, NP  DULoxetine  (CYMBALTA ) 60 MG capsule Take 60 mg by mouth daily. 07/22/24   [provider]  famotidine  (PEPCID ) 20 MG tablet Take 1 tablet (20 mg total) by mouth at bedtime. 09/28/23   Ricky Fines, MD  ferrous sulfate  325 (65 FE) MG EC tablet Take 1 tablet (325 mg total) by mouth daily with breakfast. 09/13/23   Lamon Herter M, PA-C  fluconazole  (DIFLUCAN ) 150 MG tablet Take 150 mg by  mouth once. Patient not taking: Reported on 08/23/2024 07/07/24   [provider]  glucose blood (ACCU-CHEK GUIDE) test strip Use as instructed to check blood glucose four times daily Patient not taking: Reported on 08/17/2024 12/01/21   Therisa Benton PARAS, NP  hydrOXYzine  (ATARAX ) 10 MG tablet Take 1 tablet (10 mg total) by mouth 3 (three) times daily as needed. 05/30/24   Leath-Warren, Etta PARAS, NP  hydrOXYzine  (VISTARIL ) 50 MG capsule Take 1 capsule (50 mg total) by mouth 3 (three) times daily as needed. 05/25/23   Del Orbe Polanco, Iliana, FNP  hyoscyamine  (ANASPAZ ) 0.125 MG TBDP disintergrating tablet Place 1 tablet (0.125 mg total) under the tongue every 6 (six) hours as needed. 12/13/23   Castaneda Mayorga, Daniel, MD  Insulin  Pen Needle (PEN NEEDLES) 32G X 6 MM MISC 1 each by Does not apply route 3 (three) times daily. 02/26/21   Therisa Benton PARAS, NP  insulin  regular human CONCENTRATED (HUMULIN  R U-500 KWIKPEN) 500 UNIT/ML KwikPen Inject 80 Units into the skin 3 (three) times daily with meals. 03/06/24   Therisa Benton PARAS, NP  lidocaine  (LIDODERM ) 5 % Place 1 patch onto the skin daily. Remove & Discard patch within 12 hours or as directed by MD. DO not APPLY TO INFLAMED , RED SKIN Patient not taking: Reported on 08/17/2024 04/12/24   Antonetta Rollene BRAVO, MD  lidocaine  (XYLOCAINE ) 5 % ointment Apply 1 Application topically as needed. Patient not taking: Reported on 08/17/2024 04/27/23   Del Orbe Polanco, Iliana, FNP  lisinopril  (ZESTRIL ) 5 MG tablet Take 5 mg by mouth daily.    [provider]  methadone  (DOLOPHINE ) 10 MG/ML solution Take 105 mg by mouth daily.    [provider]  methocarbamol  (ROBAXIN ) 750 MG tablet Take 1 tablet (750 mg total) by mouth 3 (three) times daily. 07/01/24   Daralene Lonni BIRCH, PA-C  metroNIDAZOLE  (FLAGYL ) 500 MG tablet Take 1 tablet (500 mg total) by mouth 2 (two) times daily. Patient not taking: Reported on 08/23/2024 08/18/24   Barrett,  Warren SAILOR, PA-C  MOUNJARO  7.5 MG/0.5ML Pen INJECT 1 PEN SUBCUTANEOUSLY ONCE A WEEK 08/17/24   Therisa Benton PARAS, NP  NARCAN  4 MG/0.1ML LIQD nasal spray kit Place 1 spray into the nose once as needed (OD). Patient not taking: Reported on 08/17/2024 04/07/24   [provider]  nicotine  (NICODERM CQ  - DOSED IN MG/24 HOURS) 14 mg/24hr patch Place 1 patch (14 mg total) onto the skin daily. 07/07/24   Therisa Benton PARAS, NP  nystatin  cream (MYCOSTATIN ) Apply 1 Application topically 2 (two) times daily. Patient not taking: Reported on 08/17/2024 10/22/23   Antonetta Rollene BRAVO, MD  Omega-3 Fatty Acids (ENTERIC FISH OIL) 1000 MG CPDR Take 1,000 mg by mouth 3 (three) times daily.    [provider]  omeprazole  (PRILOSEC) 40 MG capsule TAKE  1 CAPSULE BY MOUTH TWICE DAILY. 05/10/23   Carlan, Chelsea L, NP  pantoprazole  (PROTONIX ) 20 MG tablet Take 20 mg by mouth daily. 12/30/23   [provider]  phenazopyridine  (PYRIDIUM ) 200 MG tablet Take 1 tablet (200 mg total) by mouth 3 (three) times daily. 08/18/24   Barrett, Warren SAILOR, PA-C  potassium chloride  (KLOR-CON ) 10 MEQ tablet Take 10 mEq by mouth 2 (two) times daily. Patient not taking: Reported on 08/23/2024 08/10/23   [provider]  pregabalin  (LYRICA ) 75 MG capsule Take 75 mg by mouth. 07/28/24   [provider]  propranolol  (INDERAL ) 10 MG tablet Take 1 tablet (10 mg total) by mouth 2 (two) times daily. 09/28/23   Ricky Fines, MD  silver  sulfADIAZINE  (SILVADENE ) 1 % cream APPLY  CREAM EXTERNALLY TWICE DAILY 08/24/24   Bacchus, Gloria Z, FNP  SUMAtriptan  (IMITREX ) 50 MG tablet Take 1 tablet (50 mg total) by mouth every 2 (two) hours as needed for migraine or headache. May repeat in 2 hours if headache persists or recurs.  Maximum of 200 mg per 24 hours. Patient not taking: Reported on 08/17/2024 08/12/24   Aurea Goodell B, NP  torsemide  (DEMADEX ) 20 MG tablet Take 2 tablets (40 mg total) by mouth daily. Patient  not taking: Reported on 08/17/2024 09/28/23   Ricky Fines, MD  traZODone  (DESYREL ) 150 MG tablet Take 1-2 tablets (150-300 mg total) by mouth at bedtime as needed for sleep. Patient not taking: Reported on 08/17/2024 06/22/24 07/22/24  Jude Harden GAILS, MD  venlafaxine  XR (EFFEXOR  XR) 150 MG 24 hr capsule Take 1 capsule (150 mg total) by mouth daily with breakfast. 05/31/24   Bevely Doffing, FNP    Allergies: Firvanq  [vancomycin ], Iodine -131, Ivp dye [iodinated contrast media], Toradol [ketorolac tromethamine], Tylenol  [acetaminophen ], Nsaids, Suboxone  [buprenorphine hcl-naloxone  hcl], and Neurontin  [gabapentin ]    Review of Systems  Constitutional:  Negative for chills, diaphoresis, fatigue and fever.  HENT:  Negative for congestion, rhinorrhea and sneezing.   Eyes: Negative.   Respiratory:  Negative for cough, chest tightness and shortness of breath.   Cardiovascular:  Positive for leg swelling. Negative for chest pain.  Gastrointestinal:  Positive for abdominal pain, diarrhea, nausea and vomiting. Negative for blood in stool.  Genitourinary:  Positive for dysuria and frequency. Negative for flank pain.  Musculoskeletal:  Negative for arthralgias and back pain.  Skin:  Negative for rash.  Neurological:  Negative for dizziness, speech difficulty, weakness, numbness and headaches.    Updated Vital Signs BP 103/63 (BP Location: Right Arm)   Pulse 78   Temp 98.1 F (36.7 C) (Oral)   Resp 16   LMP 09/18/2024 (Approximate)   SpO2 97%   Physical Exam Constitutional:      Appearance: She is well-developed.  HENT:     Head: Normocephalic and atraumatic.  Eyes:     Pupils: Pupils are equal, round, and reactive to light.  Cardiovascular:     Rate and Rhythm: Normal rate and regular rhythm.     Heart sounds: Normal heart sounds.  Pulmonary:     Effort: Pulmonary effort is normal. No respiratory distress.     Breath sounds: Normal breath sounds. No wheezing or rales.  Chest:     Chest  wall: No tenderness.  Abdominal:     General: Bowel sounds are normal.     Palpations: Abdomen is soft.     Tenderness: There is abdominal tenderness in the left lower quadrant. There is no guarding or rebound.  Musculoskeletal:  General: Normal range of motion.     Cervical back: Normal range of motion and neck supple.     Comments: Patient has some mild edema of the left leg as compared to the right.  She has some excoriated lesions to the pretibial area of the leg.  No drainage.  No induration or fluctuance.  There are some mild erythema to the lower leg.  No warmth.  Pedal pulses are intact  Lymphadenopathy:     Cervical: No cervical adenopathy.  Skin:    General: Skin is warm and dry.     Findings: No rash.  Neurological:     Mental Status: She is alert and oriented to person, place, and time.     (all labs ordered are listed, but only abnormal results are displayed) Labs Reviewed  COMPREHENSIVE METABOLIC PANEL WITH GFR - Abnormal; Notable for the following components:      Result Value   Glucose, Bld 291 (*)    Alkaline Phosphatase 164 (*)    All other components within normal limits  CBC - Abnormal; Notable for the following components:   Hemoglobin 15.1 (*)    Platelets 104 (*)    All other components within normal limits  URINALYSIS, ROUTINE W REFLEX MICROSCOPIC - Abnormal; Notable for the following components:   APPearance HAZY (*)    Glucose, UA >=500 (*)    Leukocytes,Ua MODERATE (*)    All other components within normal limits  LIPASE, BLOOD  HCG, SERUM, QUALITATIVE    EKG: None  Radiology: CT ABDOMEN PELVIS WO CONTRAST Result Date: 09/25/2024 EXAM: CT ABDOMEN AND PELVIS WITHOUT CONTRAST 09/25/2024 05:18:57 PM TECHNIQUE: CT of the abdomen and pelvis was performed without the administration of intravenous contrast. Multiplanar reformatted images are provided for review. Automated exposure control, iterative reconstruction, and/or weight-based adjustment  of the mA/kV was utilized to reduce the radiation dose to as low as reasonably achievable. COMPARISON: 12/08/2023 CLINICAL HISTORY: LLQ abdominal pain FINDINGS: LOWER CHEST: Lung bases are clear. LIVER: Hepatic cirrhosis with nodular liver contour and enlarged lateral segment left lobe. No focal lesions are demonstrated on noncontrast imaging. GALLBLADDER AND BILE DUCTS: Gallbladder is unremarkable. No biliary ductal dilatation. SPLEEN: The spleen is diffusely enlarged with low attenuation changes similar to previous study. 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 Surgical clips in the spleen may be related to a prior embolization procedure. There is infiltration and stranding around the spleen. This is similar to the prior study and may be related to the chronic splenic process, although progression of splenic infarct or hematoma is not excluded, particularly on this noncontrast study. PANCREAS: Diffuse fatty infiltration of the pancreas. No acute abnormality inflammation. ADRENAL GLANDS: The glands are unremarkable. KIDNEYS, URETERS AND BLADDER: The kidneys are unremarkable. No stones in the kidneys or ureters. No hydronephrosis. No perinephric or periureteral stranding. The bladder is normal. GI AND BOWEL: The distal esophageal wall appears thickened, possibly indicating reflux disease or esophagitis. Extensive upper abdomen and paraesophageal varices. The stomach, small bowel, and colon are not abnormally distended. No wall thickening or inflammatory stranding is appreciated. No abscess. There is no bowel obstruction. PERITONEUM AND RETROPERITONEUM: No ascites. No free air. VASCULATURE: Mild calcification of the aorta. No aneurysm. LYMPH NODES: Scattered mesenteric and retroperitoneal lymph nodes are not pathologically enlarged, likely reactive. REPRODUCTIVE ORGANS: There is an intrauterine device in the uterus. No abnormal adnexal masses. BONES AND SOFT TISSUES: Degenerative changes in the  spine. No acute osseous abnormality. No focal soft tissue  abnormality. IMPRESSION: 1. No acute findings. 2. 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 3. Hepatic cirrhosis with nodular contour and enlarged lateral segment of the left lobe. No focal lesions are demonstrated on noncontrast imaging. 4. Extensive upper abdominal and paraesophageal varices. Electronically signed by: Elsie Gravely MD 09/25/2024 06:19 PM EST RP Workstation: [id number]     Procedures   Medications Ordered in the ED  sodium chloride  0.9 % bolus 1,000 mL (1,000 mLs Intravenous New Bag/Given 09/25/24 1745)  ondansetron  (ZOFRAN ) injection 4 mg (4 mg Intravenous Given 09/25/24 1746)  fentaNYL  (SUBLIMAZE ) injection 50 mcg (50 mcg Intravenous Given 09/25/24 1858)                                    Medical Decision Making Amount and/or Complexity of Data Reviewed Labs: ordered. Radiology: ordered.  Risk Prescription drug management.   This patient presents to the ED for concern of nausea vomiting and diarrhea, this involves an extensive number of treatment options, and is a complaint that carries with it a high risk of complications and morbidity.  I considered the following differential and admission for this acute, potentially life threatening condition.  The differential diagnosis includes gastroenteritis, diverticulitis, bowel obstruction, colitis  MDM:    Patient is a 38 year old who presents with nausea vomiting diarrhea.  She does have some left lower quadrant abdominal pain.  Labs reviewed and are nonconcerning.  White count is normal.  Creatinine is normal.  Her glucose is elevated.  She was given IV fluids.  There is no concerns for DKA.  Her urine does show some signs of infection.  CT scan does not show any acute  abnormality.  There is evidence of hepatosplenomegaly with esophageal varices which is not a new finding.  She feels much better after treatment here in the ED.  She is able to tolerate oral fluids without problem.  She does have a prior DVT in her left leg with some chronic swelling and redness to her leg.  She has some excoriated lesion where she has been itching.  She has little bit of redness to the pretibial area.  I have a very low suspicion that this is actual bacterial infection.  She says it is very itchy.  I am questioning whether she has a little dermatitis.  She also seems to have some chronic redness likely from the edema.  She does not report that it is really any different than it has been the last several months.  I feel like it is unlikely to be cellulitis.  Will give her prescription for mupirocin  ointment for the lesions as well as triamcinolone  cream.  Will order an ultrasound for tomorrow.  Although I have a low suspicion that she has a new DVT.  The leg is swollen but she says it similar to how it has been in the past.  She was discharged home in good condition.  She was encouraged to follow-up with her PCP.  Return precautions were given.  (Labs, imaging, consults)  Labs: I Ordered, and personally interpreted labs.  The pertinent results include: Normal white count, elevated glucose but no DKA  Imaging Studies ordered: I ordered imaging studies including CT abdomen pelvis I independently visualized and interpreted imaging. I agree with the radiologist interpretation  Additional history obtained from chart.  External records from outside source obtained and reviewed including prior notes  Cardiac Monitoring: The patient was maintained on a cardiac monitor.  If on the cardiac monitor, I personally viewed and interpreted the cardiac monitored which showed an underlying rhythm of: Sinus rhythm  Reevaluation: After the interventions noted above, I reevaluated the patient and  found that they have :improved  Social Determinants of Health:    Disposition: Discharged to home  Co morbidities that complicate the patient evaluation  Past Medical History:  Diagnosis Date   Acute encephalopathy 08/31/2020   Adjustment disorder with depressed mood 09/07/2020   Anxiety    Asthma    Chronic abdominal pain    Chronic back pain    Chronic prescription opiate use 02/16/2022   Cirrhosis (HCC)    Cirrhosis (HCC)    COPD (chronic obstructive pulmonary disease) (HCC)    Depression    Diabetes mellitus without complication (HCC)    Diabetes mellitus, type II (HCC)    Diverticulosis    DVT (deep venous thrombosis) (HCC) 02/04/2023   taking lovenox  prior to procedure   Fatty liver 03/07/2020   GERD (gastroesophageal reflux disease)    Hepatitis C    Hernia, abdominal    Hyperglycemia due to type 2 diabetes mellitus (HCC) 05/24/2020   Hyperlipidemia    IBS (irritable bowel syndrome) 02/16/2022   Insomnia    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)    Lupus    Migraine headache    Morbid obesity (HCC) 05/24/2020   Neuropathy    Nocturnal seizures (HCC)    Pain of upper abdomen 07/28/2021   Peptic ulcer    Spleen enlarged    Splenomegaly      Medicines Meds ordered this encounter  Medications   sodium chloride  0.9 % bolus 1,000 mL   ondansetron  (ZOFRAN ) injection 4 mg   fentaNYL  (SUBLIMAZE ) injection 50 mcg   ondansetron  (ZOFRAN -ODT) 4 MG disintegrating tablet    Sig: 4mg  ODT q4 hours prn nausea/vomit    Dispense:  4 tablet    Refill:  0   triamcinolone  ointment (KENALOG ) 0.5 %    Sig: Apply 1 Application topically 2 (two) times daily.    Dispense:  30 g    Refill:  0   mupirocin  ointment (BACTROBAN ) 2 %    Sig: Apply 1 Application topically 2 (two) times daily.    Dispense:  22 g    Refill:  0    I have reviewed the patients home medicines and have made adjustments as needed  Problem List / ED Course: Problem List Items  Addressed This Visit   None Visit Diagnoses       Leg swelling    -  Primary     Nausea vomiting and diarrhea                    Final diagnoses:  Leg swelling  Nausea vomiting and diarrhea    ED Discharge Orders          Ordered    LE Venous       Comments: IMPORTANT PATIENT INSTRUCTIONS: You have been scheduled for an Outpatient Vascular Study at [facility].  If tomorrow is a Saturday, Sunday or holiday, please go to the [facility] Emergency Department Registration Desk at 11 am tomorrow morning and tell them you are there for a vascular study.  If tomorrow is a weekday (Monday-Friday), please go to  the [facility] (address [address] [place]) at 8 am and report to the 4th floor registration Zone A.  Inform registration that you are there for a vascular study.   09/25/24 1954    ondansetron  (ZOFRAN -ODT) 4 MG disintegrating tablet        09/25/24 1957    triamcinolone  ointment (KENALOG ) 0.5 %  2 times daily        09/25/24 1957    mupirocin  ointment (BACTROBAN ) 2 %  2 times daily        09/25/24 1957               Lenor Hollering, MD 09/25/24 2005

## 2024-09-26 ENCOUNTER — Ambulatory Visit (HOSPITAL_COMMUNITY)
Admission: RE | Admit: 2024-09-26 | Discharge: 2024-09-26 | Disposition: A | Discharge status: Home / Self Care | Payer: MEDICAID | Source: Ambulatory Visit | Attending: Emergency Medicine | Admitting: Emergency Medicine

## 2024-09-26 DIAGNOSIS — M7989 Other specified soft tissue disorders: Secondary | ICD-10-CM

## 2024-09-26 DIAGNOSIS — L0390 Cellulitis, unspecified: Secondary | ICD-10-CM

## 2024-09-26 DIAGNOSIS — L538 Other specified erythematous conditions: Secondary | ICD-10-CM

## 2024-09-26 DIAGNOSIS — I82512 Chronic embolism and thrombosis of left femoral vein: Secondary | ICD-10-CM | POA: Diagnosis not present

## 2024-09-26 DIAGNOSIS — I82532 Chronic embolism and thrombosis of left popliteal vein: Secondary | ICD-10-CM | POA: Insufficient documentation

## 2024-09-26 DIAGNOSIS — Z86711 Personal history of pulmonary embolism: Secondary | ICD-10-CM | POA: Insufficient documentation

## 2024-09-26 DIAGNOSIS — R52 Pain, unspecified: Secondary | ICD-10-CM

## 2024-09-26 DIAGNOSIS — Z86718 Personal history of other venous thrombosis and embolism: Secondary | ICD-10-CM | POA: Diagnosis present

## 2024-09-28 ENCOUNTER — Other Ambulatory Visit: Payer: Self-pay | Admitting: Medical Genetics

## 2024-10-05 ENCOUNTER — Ambulatory Visit: Payer: MEDICAID | Admitting: Family Medicine

## 2024-10-05 ENCOUNTER — Other Ambulatory Visit: Payer: Self-pay | Admitting: Nurse Practitioner

## 2024-10-05 ENCOUNTER — Ambulatory Visit: Payer: Self-pay | Admitting: *Deleted

## 2024-10-05 DIAGNOSIS — Z7984 Long term (current) use of oral hypoglycemic drugs: Secondary | ICD-10-CM

## 2024-10-05 DIAGNOSIS — Z794 Long term (current) use of insulin: Secondary | ICD-10-CM

## 2024-10-05 DIAGNOSIS — Z7985 Long-term (current) use of injectable non-insulin antidiabetic drugs: Secondary | ICD-10-CM

## 2024-10-05 DIAGNOSIS — E1165 Type 2 diabetes mellitus with hyperglycemia: Secondary | ICD-10-CM

## 2024-10-05 NOTE — Telephone Encounter (Signed)
 scheduled

## 2024-10-05 NOTE — Telephone Encounter (Signed)
 See NT encounter below, unsure patient will go back to ED. Requesting appt / medication. CAL notified, Belvie.   FYI Only or Action Required?: FYI only for provider: ED advised and unsure patient will go back to ED, patient requesting additional antibiotics.  Patient was last seen in primary care on 08/23/2024 by Mayers, Kirk RAMAN, PA-C.  Called Nurse Triage reporting Leg Pain.  Symptoms began several months ago.  Interventions attempted: Prescription medications: keflex .  Symptoms are: gradually worsening.  Triage Disposition: See HCP Within 4 Hours (Or PCP Triage)  Patient/caregiver understands and will follow disposition?: Unsure                Copied from CRM #[id number]. Topic: Clinical - Red Word Triage >> Oct 05, 2024 12:42 PM [place] MATSU wrote: Red Word that prompted transfer to Nurse Triage: pain swelling itching in left leg with worsening symptoms. Reason for Disposition  [1] SEVERE pain (e.g., excruciating, unable to do any normal activities) AND [2] not improved after 2 hours of pain medicine  Answer Assessment - Initial Assessment Questions No available appt today or tomorrow. Recommended patient go back to ED. Patient would like to see if she can be seen in office instead of ED for additional antibiotics. Please advise. Requesting a call back. Unsure if patient will go to ED.  CAL notified ,Belvie unsure patient will go back to ED.     1. ONSET: When did the pain start?      Few months  2. LOCATION: Where is the pain located?      Left leg between knee and ankle 3. PAIN: How bad is the pain?    (Scale 1-10; or mild, moderate, severe)     8/10 4. WORK OR EXERCISE: Has there been any recent work or exercise that involved this part of the body?      No  5. CAUSE: What do you think is causing the leg pain?  See last ED visit 09/25/24 cellulitis 6. OTHER SYMPTOMS: Do you have any other symptoms? (e.g., chest pain, back pain, breathing difficulty,  swelling, rash, fever, numbness, weakness)     2 x swelling in left leg than right leg, ,redness, itching pain, warm to touch. Bleeding at times from scratching. Finished antibiotics. 7. PREGNANCY: Is there any chance you are pregnant? When was your last menstrual period?     na  Protocols used: Leg Pain-A-AH

## 2024-10-06 ENCOUNTER — Ambulatory Visit: Payer: MEDICAID | Admitting: Nurse Practitioner

## 2024-10-06 ENCOUNTER — Other Ambulatory Visit: Payer: Self-pay | Admitting: Nurse Practitioner

## 2024-10-06 DIAGNOSIS — Z7984 Long term (current) use of oral hypoglycemic drugs: Secondary | ICD-10-CM

## 2024-10-06 DIAGNOSIS — Z794 Long term (current) use of insulin: Secondary | ICD-10-CM

## 2024-10-06 DIAGNOSIS — E1165 Type 2 diabetes mellitus with hyperglycemia: Secondary | ICD-10-CM

## 2024-10-06 DIAGNOSIS — Z7985 Long-term (current) use of injectable non-insulin antidiabetic drugs: Secondary | ICD-10-CM

## 2024-10-06 MED ORDER — FREESTYLE LIBRE 3 PLUS SENSOR MISC: 3 refills | Status: AC

## 2024-10-10 ENCOUNTER — Ambulatory Visit: Admission: EM | Admit: 2024-10-10 | Discharge: 2024-10-10 | Discharge status: Against Medical Advice | Payer: MEDICAID

## 2024-10-10 ENCOUNTER — Ambulatory Visit: Payer: MEDICAID | Admitting: Nurse Practitioner

## 2024-10-10 ENCOUNTER — Encounter (HOSPITAL_BASED_OUTPATIENT_CLINIC_OR_DEPARTMENT_OTHER): Payer: Self-pay | Admitting: Emergency Medicine

## 2024-10-10 ENCOUNTER — Other Ambulatory Visit: Payer: Self-pay

## 2024-10-10 ENCOUNTER — Ambulatory Visit: Payer: Self-pay

## 2024-10-10 ENCOUNTER — Emergency Department (HOSPITAL_BASED_OUTPATIENT_CLINIC_OR_DEPARTMENT_OTHER)
Admission: EM | Admit: 2024-10-10 | Discharge: 2024-10-10 | Disposition: A | Discharge status: Home / Self Care | Payer: MEDICAID | Attending: Emergency Medicine | Admitting: Emergency Medicine

## 2024-10-10 ENCOUNTER — Emergency Department (HOSPITAL_BASED_OUTPATIENT_CLINIC_OR_DEPARTMENT_OTHER): Payer: MEDICAID

## 2024-10-10 DIAGNOSIS — L309 Dermatitis, unspecified: Secondary | ICD-10-CM

## 2024-10-10 DIAGNOSIS — L299 Pruritus, unspecified: Secondary | ICD-10-CM

## 2024-10-10 DIAGNOSIS — R079 Chest pain, unspecified: Secondary | ICD-10-CM

## 2024-10-10 LAB — CBC
HCT: 45.9 % (ref 36.0–46.0)
Hemoglobin: 15.4 g/dL — ABNORMAL HIGH (ref 12.0–15.0)
MCH: 29.6 pg (ref 26.0–34.0)
MCHC: 33.6 g/dL (ref 30.0–36.0)
MCV: 88.3 fL (ref 80.0–100.0)
Platelets: 102 K/uL — ABNORMAL LOW (ref 150–400)
RBC: 5.2 MIL/uL — ABNORMAL HIGH (ref 3.87–5.11)
RDW: 13 % (ref 11.5–15.5)
WBC: 6 K/uL (ref 4.0–10.5)
nRBC: 0 % (ref 0.0–0.2)

## 2024-10-10 LAB — BASIC METABOLIC PANEL WITH GFR
Anion gap: 9 (ref 5–15)
BUN: 8 mg/dL (ref 6–20)
CO2: 28 mmol/L (ref 22–32)
Calcium: 9.1 mg/dL (ref 8.9–10.3)
Chloride: 99 mmol/L (ref 98–111)
Creatinine, Ser: 0.69 mg/dL (ref 0.44–1.00)
GFR, Estimated: >60 mL/min (ref >60)
Glucose, Bld: 266 mg/dL — ABNORMAL HIGH (ref 70–99)
Potassium: 4 mmol/L (ref 3.5–5.1)
Sodium: 135 mmol/L (ref 135–145)

## 2024-10-10 LAB — TROPONIN T, HIGH SENSITIVITY: Troponin T High Sensitivity: <15 ng/L (ref 0–19)

## 2024-10-10 MED ORDER — OXYCODONE HCL 5 MG PO TABS
10.0000 mg | ORAL_TABLET | Freq: Once | ORAL | Status: AC
  Administered 2024-10-10: 10 mg via ORAL
  Filled 2024-10-10: qty 2

## 2024-10-10 MED ORDER — OXYCODONE HCL 5 MG PO TABS
5.0000 mg | ORAL_TABLET | Freq: Once | ORAL | Status: AC
  Administered 2024-10-10: 5 mg via ORAL
  Filled 2024-10-10: qty 1

## 2024-10-10 MED ORDER — HYDROXYZINE HCL 25 MG PO TABS
25.0000 mg | ORAL_TABLET | Freq: Once | ORAL | Status: AC
  Administered 2024-10-10: 25 mg via ORAL
  Filled 2024-10-10: qty 1

## 2024-10-10 MED ORDER — ONDANSETRON 4 MG PO TBDP
4.0000 mg | ORAL_TABLET | Freq: Once | ORAL | Status: DC
  Filled 2024-10-10: qty 1

## 2024-10-10 MED ORDER — OXYCODONE HCL 5 MG PO TABS: 5.0000 mg | ORAL_TABLET | Freq: Four times a day (QID) | ORAL | 0 refills | Status: AC | PRN

## 2024-10-10 MED ORDER — HYDROXYZINE HCL 25 MG PO TABS: 25.0000 mg | ORAL_TABLET | Freq: Three times a day (TID) | ORAL | 0 refills | Status: DC | PRN

## 2024-10-10 MED ORDER — ONDANSETRON 4 MG PO TBDP
4.0000 mg | ORAL_TABLET | Freq: Once | ORAL | Status: AC
  Administered 2024-10-10: 4 mg via ORAL
  Filled 2024-10-10: qty 1

## 2024-10-10 NOTE — ED Triage Notes (Signed)
 Pt reports DVT dx  a few months ago in LLE, currently taking eliquis .  C/o new LLE swelling, itching, pain x 3 weeks. Prescribed antibiotics for same, taken and finished as prescribed. Itching and pain ongoing.   + shob, + mid chest pain x 3 days. Compliant with eliquis .

## 2024-10-10 NOTE — ED Notes (Signed)
 Pt was ambulated in the department and maintained an o2 reading of 98% throughout.  She walked at a brisk pace and with minimal concern.

## 2024-10-10 NOTE — Telephone Encounter (Signed)
 FYI Only or Action Required?: FYI only for provider: Advised 911, pt declines, states she will go to ED.  Patient was last seen in primary care on 08/23/2024 by Mayers, Kirk RAMAN, PA-C.  Called Nurse Triage reporting Leg Swelling and Pruritis.  Symptoms began several days ago.  Interventions attempted: Prescription medications: Eliquis  and Rest, hydration, or home remedies.  Symptoms are: gradually worsening.  Triage Disposition: Call EMS 911 Now  Patient/caregiver understands and will follow disposition?: No, refuses disposition. Declines, pt agreeable to go to ED. States she has someone that can drive her.   Copied from CRM #[id number]. Topic: Clinical - Red Word Triage >> Oct 10, 2024 11:50 AM Amber H wrote: [facility] that prompted transfer to Nurse Triage: Patient called on 12/04 and spoke to NT about leg swelling, was advised to follow up with pcp. Apt schd for 12/11 however, wants something sooner. Leg swelling still the same, reports pain, itching, developed scabs and spots are now bleeding. She has the earliest appt time. Next available is that Friday. Reason for Disposition  [1] Chest pain lasts > 5 minutes AND [2] age > 30 AND [3] one or more cardiac risk factors (e.g., diabetes, high blood pressure, high cholesterol, obesity with BMI 30 or higher, smoker, or strong family history of heart disease)  [1] Chest pain lasts > 5 minutes AND [2] described as crushing, pressure-like, or heavy  Answer Assessment - Initial Assessment Questions Pt scheduled for OV 12/11 to f/u on persistent redness and swelling of left leg. Calling to see if appt can be moved up. 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 Went to ED on 11/24 for complaints of n/v/d. Had outpt LE US  on 11/25 ordereed by ED provider to check on chronic DVT status, negative for new DVTs, chronic DVT stable. Reports redness and swelling have worsened. Also reports new onset moderate SOB for several weeks, no SOB  at baseline. New onset intermittent mid sternal sharp crushing squeezing 5-6/10 CP 5 days ago. Worse with exertion. Present now. Pt does not sound acutely distress over the phone and breathing does nto sound acutely labored. Advised 911 now, offered to call for pt. Declines. Advised ED ASAP as minimum, pt agreeable to go and states someone can take her. Advised to take 162 mg chewable aspirin  now. Called [facility] and spoke with Brittany to notify of 911 refusal, agreeable to ED.    1. LOCATION: Where does it hurt?       Central  2. RADIATION: Does the pain go anywhere else? (e.g., into neck, jaw, arms, back)     To back  3. ONSET: When did the chest pain begin? (Minutes, hours or days)      5 days ago  4. PATTERN: Does the pain come and go, or has it been constant since it started?  Does it get worse with exertion?      Constant  5. DURATION: How long does it last (e.g., seconds, minutes, hours)     30-45 minutes  6. SEVERITY: How bad is the pain?  (e.g., Scale 1-10; mild, moderate, or severe)     5-6/10  7. CARDIAC RISK FACTORS: Do you have any history of heart problems or risk factors for heart disease? (e.g., angina, prior heart attack; diabetes, high blood pressure, high cholesterol, smoker, or strong family history of heart disease)     Chronic DVT in left leg  8. PULMONARY RISK FACTORS: Do you have any history of  lung disease?  (e.g., blood clots in lung, asthma, emphysema, birth control pills)     Denies  9. CAUSE: What do you think is causing the chest pain?     Unsure. Maybe stress  10. OTHER SYMPTOMS: Do you have any other symptoms? (e.g., dizziness, nausea, vomiting, sweating, fever, difficulty breathing, cough)       Cough and nausea and headaches. LLE redness and swelling  11. PREGNANCY: Is there any chance you are pregnant? When was your last menstrual period?       Denies  Protocols used: Chest Pain-A-AH

## 2024-10-10 NOTE — ED Triage Notes (Signed)
 Spoke to pt and pt given limitations and pt decided to go for eval in ED

## 2024-10-10 NOTE — ED Notes (Signed)
 Arrived to re-vital this pt and she noted that her pain was a 9 out of 10, that previous med admin of PO oxycodone  showed no relief of her lower left leg pain

## 2024-10-10 NOTE — Discharge Instructions (Signed)
 Please read and follow all provided instructions.  Your diagnoses today include:  1. Pruritus   2. Dermatitis   3. Chest pain, unspecified type     Tests performed today include: An EKG of your heart A chest x-ray Cardiac enzymes: a blood test for heart muscle damage Blood counts and electrolytes: normal infection fighting cell count Vital signs. See below for your results today.   Medications prescribed:  Oxycodone  - narcotic pain medication  DO NOT drive or perform any activities that require you to be awake and alert because this medicine can make you drowsy.   Hydroxyzine  - antihistamine  You can find this medication over-the-counter.   This medication will make you drowsy. DO NOT drive or perform any activities that require you to be awake and alert if taking this.  Take any prescribed medications only as directed.  Follow-up instructions: Please follow-up with your primary care provider as soon as you can for further evaluation of your symptoms.   Return instructions:  SEEK IMMEDIATE MEDICAL ATTENTION IF: You have severe chest pain, especially if the pain is crushing or pressure-like and spreads to the arms, back, neck, or jaw, or if you have sweating, nausea or vomiting, or trouble with breathing. THIS IS AN EMERGENCY. Do not wait to see if the pain will go away. Get medical help at once. Call 911. DO NOT drive yourself to the hospital.  Return with worsening pain, worsening swelling, expanding area of redness or streaking up extremity, fever, or any other concerns.  Your chest pain gets worse and does not go away after a few minutes of rest.  You have an attack of chest pain lasting longer than what you usually experience.  You have significant dizziness, if you pass out, or have trouble walking.  You have chest pain not typical of your usual pain for which you originally saw your caregiver.  You have any other emergent concerns regarding your health.  Additional  Information: Chest pain comes from many different causes. Your caregiver has diagnosed you as having chest pain that is not specific for one problem, but does not require admission.  You are at low risk for an acute heart condition or other serious illness.   Your vital signs today were: BP 118/73   Pulse 74   Temp 98.3 F (36.8 C)   Resp 19   Ht 5' 10 (1.778 m)   Wt 104.3 kg   LMP 09/18/2024 (Approximate)   SpO2 94%   BMI 33.00 kg/m  If your blood pressure (BP) was elevated above 135/85 this visit, please have this repeated by your doctor within one month. --------------

## 2024-10-10 NOTE — ED Provider Notes (Signed)
 [place] [facility]PART[facility] AT [facility] Provider Note   CSN: [id number] Arrival date & time: 10/10/24  8094     Patient presents with: Shortness of Breath and Leg Swelling   Jaime [name] is a 38 y.o. female.   Patient with history of hepatitis C/cirrhosis, COPD, diabetes, DVT on eliquis  (reports compliance), migraines, chronic low back pain --presents to the emergency department today for evaluation of left lower extremity pain and itching.  She also reports chest pain.  The lower extremity pain and itching have been a chronic problem.  Patient states that she scratches at the area.  Patient has been prescribed antibiotics in the past.  Most recently prescribed Keflex  for UTI, mupirocin  topical for the left lower extremity dermatitis, on 11/24.  She had a DVT study on 11/25 that was consistent with chronic DVT.  Patient states that that medication did not really help and she continues to have the symptoms.  Patient also reports mid chest pain described as a sharp and pressure sensation in the mid chest.  This has been on and off for about 3 days.  No vomiting.  No cough or fevers.  She reports associated shortness of breath.  Denies history of ACS.  Recent pertinent workup includes: 09/26/2024 DVT study, chronic DVT 08/24/2024 DVT study, chronic DVT 04/11/2024 DVT study, chronic DVT  08/25/2024 CT angio, negative for PE 07/05/2024 CT angio, negative for PE       Prior to Admission medications   Medication Sig Start Date End Date Taking? Authorizing Provider  acetaminophen  (TYLENOL ) 500 MG tablet Take 1,000 mg by mouth 2 (two) times daily as needed for moderate pain (pain score 4-6) or headache.    [provider]  albuterol  (VENTOLIN  HFA) 108 (90 Base) MCG/ACT inhaler Inhale 2 puffs into the lungs every 6 (six) hours as needed for wheezing or shortness of breath. 07/17/22   Leath-Warren, Etta PARAS, NP  apixaban  (ELIQUIS ) 5 MG TABS tablet Take 2  tablets (10 mg total) by mouth 2 (two) times daily for 6 days, THEN 1 tablet (5 mg total) 2 (two) times daily. 09/28/23 08/17/24  Ricky Fines, MD  apixaban  (ELIQUIS ) 5 MG TABS tablet Take 5 mg by mouth. 07/28/24   [provider]  ARIPiprazole  (ABILIFY ) 20 MG tablet Take 1 tablet (20 mg total) by mouth daily. 05/31/24   Bevely Doffing, FNP  aspirin  81 MG chewable tablet Chew 81 mg by mouth. Patient not taking: Reported on 08/23/2024 06/12/20   [provider]  betamethasone  dipropionate (DIPROLENE ) 0.05 % ointment Apply topically 2 (two) times daily. 07/05/24   Barrett, Warren SAILOR, PA-C  buPROPion (WELLBUTRIN XL) 150 MG 24 hr tablet Take 150 mg by mouth. 06/29/24   [provider]  cephALEXin  (KEFLEX ) 500 MG capsule Take 1 capsule (500 mg total) by mouth 2 (two) times daily. 09/25/24   Lenor Hollering, MD  chlorhexidine  (HIBICLENS ) 4 % external liquid Apply topically daily as needed. 05/13/24   Stuart Vernell Norris, PA-C  clonazePAM  (KLONOPIN ) 0.5 MG tablet Take 0.5 mg by mouth 2 (two) times daily as needed for anxiety. 08/02/23   [provider]  Continuous Glucose Receiver (FREESTYLE LIBRE 3 READER) DEVI Use the Freestyle Libre 3 Reader Device to monitor blood sugars as directed by the provider. 02/29/24   Therisa Benton PARAS, NP  Continuous Glucose Sensor (FREESTYLE LIBRE 3 PLUS SENSOR) MISC USE TO CHECK GLUCOSE CONTINUOUSLY. CHANGE SENSOR EVERY 15 DAYS 10/06/24   Therisa Benton PARAS, NP  DULoxetine  (CYMBALTA ) 60 MG capsule Take 60 mg by mouth daily. 07/22/24   [provider]  famotidine  (PEPCID ) 20 MG tablet Take 1 tablet (20 mg total) by mouth at bedtime. 09/28/23   Ricky Fines, MD  ferrous sulfate  325 (65 FE) MG EC tablet Take 1 tablet (325 mg total) by mouth daily with breakfast. 09/13/23   Lamon Herter M, PA-C  fluconazole  (DIFLUCAN ) 150 MG tablet Take 150 mg by mouth once. Patient not taking: Reported on 08/23/2024 07/07/24   [provider]   glucose blood (ACCU-CHEK GUIDE) test strip Use as instructed to check blood glucose four times daily Patient not taking: Reported on 08/17/2024 12/01/21   Therisa Benton PARAS, NP  hydrOXYzine  (ATARAX ) 10 MG tablet Take 1 tablet (10 mg total) by mouth 3 (three) times daily as needed. 05/30/24   Leath-Warren, Etta PARAS, NP  hydrOXYzine  (VISTARIL ) 50 MG capsule Take 1 capsule (50 mg total) by mouth 3 (three) times daily as needed. 05/25/23   Del Orbe Polanco, Iliana, FNP  hyoscyamine  (ANASPAZ ) 0.125 MG TBDP disintergrating tablet Place 1 tablet (0.125 mg total) under the tongue every 6 (six) hours as needed. 12/13/23   Castaneda Mayorga, Daniel, MD  Insulin  Pen Needle (PEN NEEDLES) 32G X 6 MM MISC 1 each by Does not apply route 3 (three) times daily. 02/26/21   Therisa Benton PARAS, NP  insulin  regular human CONCENTRATED (HUMULIN  R U-500 KWIKPEN) 500 UNIT/ML KwikPen Inject 80 Units into the skin 3 (three) times daily with meals. 03/06/24   Therisa Benton PARAS, NP  lidocaine  (LIDODERM ) 5 % Place 1 patch onto the skin daily. Remove & Discard patch within 12 hours or as directed by MD. DO not APPLY TO INFLAMED , RED SKIN Patient not taking: Reported on 08/17/2024 04/12/24   Antonetta Rollene BRAVO, MD  lidocaine  (XYLOCAINE ) 5 % ointment Apply 1 Application topically as needed. Patient not taking: Reported on 08/17/2024 04/27/23   Del Orbe Polanco, Iliana, FNP  lisinopril  (ZESTRIL ) 5 MG tablet Take 5 mg by mouth daily.    [provider]  methadone  (DOLOPHINE ) 10 MG/ML solution Take 105 mg by mouth daily.    [provider]  methocarbamol  (ROBAXIN ) 750 MG tablet Take 1 tablet (750 mg total) by mouth 3 (three) times daily. 07/01/24   Daralene Lonni BIRCH, PA-C  metroNIDAZOLE  (FLAGYL ) 500 MG tablet Take 1 tablet (500 mg total) by mouth 2 (two) times daily. Patient not taking: Reported on 08/23/2024 08/18/24   Barrett, Warren SAILOR, PA-C  MOUNJARO  7.5 MG/0.5ML Pen INJECT 1 PEN SUBCUTANEOUSLY ONCE A WEEK  08/17/24   Therisa Benton PARAS, NP  mupirocin  ointment (BACTROBAN ) 2 % Apply 1 Application topically 2 (two) times daily. 09/25/24   Lenor Hollering, MD  NARCAN  4 MG/0.1ML LIQD nasal spray kit Place 1 spray into the nose once as needed (OD). Patient not taking: Reported on 08/17/2024 04/07/24   [provider]  nicotine  (NICODERM CQ  - DOSED IN MG/24 HOURS) 14 mg/24hr patch Place 1 patch (14 mg total) onto the skin daily. 07/07/24   Therisa Benton PARAS, NP  nystatin  cream (MYCOSTATIN ) Apply 1 Application topically 2 (two) times daily. Patient not taking: Reported on 08/17/2024 10/22/23   Antonetta Rollene BRAVO, MD  Omega-3 Fatty Acids (ENTERIC FISH OIL) 1000 MG CPDR Take 1,000 mg by mouth 3 (three) times daily.    [provider]  omeprazole  (PRILOSEC) 40 MG capsule TAKE 1 CAPSULE BY MOUTH TWICE DAILY. 05/10/23   Carlan, Chelsea L, NP  ondansetron  (  ZOFRAN -ODT) 4 MG disintegrating tablet 4mg  ODT q4 hours prn nausea/vomit 09/25/24   Lenor Hollering, MD  pantoprazole  (PROTONIX ) 20 MG tablet Take 20 mg by mouth daily. 12/30/23   [provider]  phenazopyridine  (PYRIDIUM ) 200 MG tablet Take 1 tablet (200 mg total) by mouth 3 (three) times daily. 08/18/24   Barrett, Warren SAILOR, PA-C  potassium chloride  (KLOR-CON ) 10 MEQ tablet Take 10 mEq by mouth 2 (two) times daily. Patient not taking: Reported on 08/23/2024 08/10/23   [provider]  pregabalin  (LYRICA ) 75 MG capsule Take 75 mg by mouth. 07/28/24   [provider]  propranolol  (INDERAL ) 10 MG tablet Take 1 tablet (10 mg total) by mouth 2 (two) times daily. 09/28/23   Ricky Fines, MD  silver  sulfADIAZINE  (SILVADENE ) 1 % cream APPLY  CREAM EXTERNALLY TWICE DAILY 08/24/24   Bacchus, Gloria Z, FNP  SUMAtriptan  (IMITREX ) 50 MG tablet Take 1 tablet (50 mg total) by mouth every 2 (two) hours as needed for migraine or headache. May repeat in 2 hours if headache persists or recurs.  Maximum of 200 mg per 24 hours. Patient not  taking: Reported on 08/17/2024 08/12/24   Aurea Goodell B, NP  torsemide  (DEMADEX ) 20 MG tablet Take 2 tablets (40 mg total) by mouth daily. Patient not taking: Reported on 08/17/2024 09/28/23   Ricky Fines, MD  traZODone  (DESYREL ) 150 MG tablet Take 1-2 tablets (150-300 mg total) by mouth at bedtime as needed for sleep. Patient not taking: Reported on 08/17/2024 06/22/24 07/22/24  Jude Harden GAILS, MD  triamcinolone  ointment (KENALOG ) 0.5 % Apply 1 Application topically 2 (two) times daily. 09/25/24   Lenor Hollering, MD  venlafaxine  XR (EFFEXOR  XR) 150 MG 24 hr capsule Take 1 capsule (150 mg total) by mouth daily with breakfast. 05/31/24   Bevely Doffing, FNP    Allergies: Firvanq  [vancomycin ], Iodine -131, Ivp dye [iodinated contrast media], Toradol [ketorolac tromethamine], Tylenol  [acetaminophen ], Nsaids, Suboxone [buprenorphine hcl-naloxone  hcl], and Neurontin  [gabapentin ]    Review of Systems  Updated Vital Signs BP 139/86   Pulse 91   Temp 98.3 F (36.8 C)   Resp 18   Ht 5' 10 (1.778 m)   Wt 104.3 kg   LMP 09/18/2024 (Approximate)   SpO2 99%   BMI 33.00 kg/m   Physical Exam Vitals and nursing note reviewed.  Constitutional:      Appearance: She is well-developed. She is not diaphoretic.  HENT:     Head: Normocephalic and atraumatic.     Mouth/Throat:     Mouth: Mucous membranes are not dry.  Eyes:     Conjunctiva/sclera: Conjunctivae normal.  Neck:     Vascular: Normal carotid pulses. No JVD.     Trachea: Trachea normal. No tracheal deviation.  Cardiovascular:     Rate and Rhythm: Normal rate and regular rhythm.     Pulses: No decreased pulses.          Radial pulses are 2+ on the right side and 2+ on the left side.     Heart sounds: Normal heart sounds, S1 normal and S2 normal. No murmur heard. Pulmonary:     Effort: Pulmonary effort is normal. No respiratory distress.     Breath sounds: No wheezing, rhonchi or rales.     Comments: Lungs clear to auscultation  bilaterally, no tachycardia.  Patient speaking in full sentences without increased work of breathing, appears very comfortable. Chest:     Chest wall: No tenderness.  Abdominal:     General: Bowel  sounds are normal.     Palpations: Abdomen is soft.     Tenderness: There is no abdominal tenderness. There is no guarding or rebound.  Musculoskeletal:        General: Normal range of motion.     Cervical back: Normal range of motion and neck supple. No muscular tenderness.     Right lower leg: No edema.     Left lower leg: Tenderness present. Edema present.     Comments: Patient with left lower extremity as pictured, patient appears to have some excoriations and mild erythema consistent with dermatitis  Skin:    General: Skin is warm and dry.     Coloration: Skin is not pale.  Neurological:     Mental Status: She is alert.     (all labs ordered are listed, but only abnormal results are displayed) Labs Reviewed  BASIC METABOLIC PANEL WITH GFR - Abnormal; Notable for the following components:      Result Value   Glucose, Bld 266 (*)    All other components within normal limits  CBC - Abnormal; Notable for the following components:   RBC 5.20 (*)    Hemoglobin 15.4 (*)    Platelets 102 (*)    All other components within normal limits  PREGNANCY, URINE  TROPONIN T, HIGH SENSITIVITY    EKG: EKG Interpretation Date/Time:  Tuesday October 10 2024 19:13:32 EST Ventricular Rate:  90 PR Interval:  158 QRS Duration:  83 QT Interval:  358 QTC Calculation: 438 R Axis:   46  Text Interpretation: Sinus rhythm nonspecific changes similar to Oct 2025 Confirmed by Freddi Hamilton [phone]) on 10/10/2024 7:17:27 PM  Radiology: No results found.   Procedures   Medications Ordered in the ED  oxyCODONE  (Oxy IR/ROXICODONE ) immediate release tablet 10 mg (has no administration in time range)  ondansetron  (ZOFRAN -ODT) disintegrating tablet 4 mg (has no administration in time range)   ED  Course  Patient seen and examined. History obtained directly from patient.  Reviewed previous evaluations over the past 6 months.  Labs/EKG: Ordered CBC, BMP, troponin.  EKG personally reviewed and interpreted as above, no acute or ischemic changes.  Imaging: Ordered chest x-ray.  Medications/Fluids: Ordered: P.o. oxycodone , ODT Zofran .   Most recent vital signs reviewed and are as follows: BP 139/86   Pulse 91   Temp 98.3 F (36.8 C)   Resp 18   Ht 5' 10 (1.778 m)   Wt 104.3 kg   LMP 09/18/2024 (Approximate)   SpO2 99%   BMI 33.00 kg/m   Initial impression: Patient's left lower extremity symptoms seem to be chronic in nature, not significantly changed over the past few weeks.  Some mild dermatitis without significant cellulitis noted.  Lab workup including CBC ordered.  Patient reports compliance with her anticoagulant.  She denies missed doses.  No significant appreciable swelling and I have low concern for worsening DVT at this time.  Chest pain seems atypical.  Cardiac workup ordered and pending.  I have low overall concern for PE given patient's report of compliance with anticoagulant.  She is not tachycardic or hypoxic.  Will ambulate patient to ensure no hypoxia.  Will check troponin.  If this is elevated, she will likely need repeat imaging of the chest, but clinically low concern for PE at this time.  Patient and work-up discussed with Dr. Freddi.   9:46 PM Reassessment performed. Patient appears stable during ED stay.  Patient ambulated with oxygen saturation at 97%, no respiratory  difficulty.  Her vital signs remained normal.  Labs personally reviewed and interpreted including: CBC with normal white blood cell count, hemoglobin mildly elevated at 15.4, platelets low at 102 (similar to previous); BMP glucose 266 with normal anion gap, otherwise unremarkable; troponin less than 15.  Imaging personally visualized and interpreted including: Chest x-ray agree  negative.  Reviewed pertinent lab work and imaging with patient at bedside. Questions answered.   Most current vital signs reviewed and are as follows: BP 118/73   Pulse 94   Temp 98.3 F (36.8 C)   Resp 20   Ht 5' 10 (1.778 m)   Wt 104.3 kg   LMP 09/18/2024 (Approximate)   SpO2 98%   BMI 33.00 kg/m   Plan: Discharge to home.   Prescriptions written for: Oxycodone  # 6 tablets for pain, hydroxyzine  for itching.  Patient also requesting note for work.  Other home care instructions discussed: Topical antibiotics for dermatitis left lower extremity, encouraged not to scratch or irritate the area as this could increase risk for infection.  ED return instructions discussed: Return and follow-up instructions: I encouraged patient to return to ED with severe chest pain, especially if the pain is crushing or pressure-like and spreads to the arms, back, neck, or jaw, or if they have associated sweating, vomiting, or shortness of breath with the pain, or significant pain with activity. We discussed that the evaluation here today indicates a low-risk of serious cause of chest pain, including heart trouble or a blood clot, but no evaluation is perfect and chest pain can evolve with time. The patient verbalized understanding and agreed.  I encouraged patient to follow-up with their provider in the next 48 hours for recheck.    Follow-up instructions discussed: Patient encouraged to follow-up with their PCP in 2 days.                                   Medical Decision Making Amount and/or Complexity of Data Reviewed Labs: ordered. Radiology: ordered.  Risk Prescription drug management.   For this patient's complaint of chest pain, the following emergent conditions were considered on the differential diagnosis: acute coronary syndrome, pulmonary embolism, pneumothorax, myocarditis, pericardial tamponade, aortic dissection, thoracic aortic aneurysm complication, esophageal perforation.    Other causes were also considered including: gastroesophageal reflux disease, musculoskeletal pain including costochondritis, pneumonia/pleurisy, herpes zoster, pericarditis.  In regards to possibility of ACS, patient has atypical features of pain, non-ischemic and unchanged EKG and negative troponin(s).  Heart score equal to 2.  Patient has known DVT in the left lower extremity, stable over the past several months.  Patient reports compliance with her anticoagulation.  She has had lower extremity pain and itching with what appears to be dermatitis.  I have low concern for worsening DVT.    In regards to chest pain, PE considered however patient with several previous negative CTA, but also no hypoxia, tachycardia, exertional hypoxia today as well as normal troponin.  I have low clinical concern for PE.  Do not suspect severe infection in the lower extremity given normal white blood cell count, no fever, symptoms and exam not consistent with severe infection.      Final diagnoses:  Pruritus  Dermatitis  Chest pain, unspecified type    ED Discharge Orders          Ordered    oxyCODONE  (OXY IR/ROXICODONE ) 5 MG immediate release tablet  Every 6 hours  PRN        10/10/24 2142    hydrOXYzine  (ATARAX ) 25 MG tablet  Every 8 hours PRN        10/10/24 2142               Desiderio Chew, PA-C 10/10/24 2151

## 2024-10-11 ENCOUNTER — Telehealth: Payer: Self-pay

## 2024-10-11 NOTE — Telephone Encounter (Signed)
ED advised

## 2024-10-11 NOTE — Telephone Encounter (Signed)
 Copied from CRM #[id number]. Topic: Appointments - Scheduling Inquiry for Clinic >> Oct 11, 2024 10:48 AM Treva T wrote: Reason for CRM: Pt calling to verify appt scheduled for 10/12/24.  Appt details given to pt, however pt would like to know if visit can be changed to an in person visit with provider.  Attempted to change but was unable to do so.  Pt can be reached fro a follow up call if this is possible.  Ph. [phone]  Pt is aware of same day call back

## 2024-10-11 NOTE — Telephone Encounter (Signed)
 Pt. Contacted. She will keep her virtual appt for tomorrow

## 2024-10-12 ENCOUNTER — Encounter: Payer: Self-pay | Admitting: Family Medicine

## 2024-10-12 ENCOUNTER — Telehealth (INDEPENDENT_AMBULATORY_CARE_PROVIDER_SITE_OTHER): Payer: MEDICAID | Admitting: Family Medicine

## 2024-10-12 DIAGNOSIS — L299 Pruritus, unspecified: Secondary | ICD-10-CM | POA: Diagnosis not present

## 2024-10-12 DIAGNOSIS — L03116 Cellulitis of left lower limb: Secondary | ICD-10-CM | POA: Diagnosis not present

## 2024-10-12 DIAGNOSIS — G4700 Insomnia, unspecified: Secondary | ICD-10-CM

## 2024-10-12 MED ORDER — HYDROXYZINE PAMOATE 50 MG PO CAPS: 50.0000 mg | ORAL_CAPSULE | Freq: Every evening | ORAL | 0 refills | Status: DC

## 2024-10-12 MED ORDER — TRAZODONE HCL 100 MG PO TABS: 200.0000 mg | ORAL_TABLET | Freq: Every day | ORAL | 0 refills | Status: AC

## 2024-10-12 NOTE — Progress Notes (Unsigned)
 Virtual Visit via Video Note  I connected with Jaime [name] on 10/15/2024 at  2:00 PM EST by a video enabled telemedicine application and verified that I am speaking with the correct person using two identifiers.  Patient Location: Home Provider Location: Home Office   Encouraged to seek urgent medical care if experiencing symptoms of serotonin syndrome, such as confusion, agitation, rapid heart rate, high blood pressure, dilated pupils, muscle rigidity, and tremors.    I discussed the limitations, risks, security, and privacy concerns of performing an evaluation and management service by video and the availability of in person appointments. I also discussed with the patient that there may be a patient responsible charge related to this service. The patient expressed understanding and agreed to proceed.  Subjective: PCP: Jaime Doffing, FNP  Chief Complaint  Patient presents with   Follow-up    ER follow up   HPI  The patient was evaluated in the ED on 10/10/2024 for left lower extremity pain and itching, which has been a chronic issue. A prior DVT study completed on 11/25 was consistent with chronic DVT. She reported completing a 7-day course of Keflex  for a UTI, finishing 5 days ago. Labs and imaging obtained in the ED were reassuring. She was prescribed a short course of oxycodone  (6 tablets) for pain, hydroxyzine  for itching, and advised to continue topical mupirocin  ointment. Today, she reports ongoing itching, redness, and swelling in the affected left lower extremity.    ROS: Per HPI Current Medications[1]  Observations/Objective: There were no vitals filed for this visit. Physical Exam Patient is well-developed, well-nourished in no acute distress.  Resting comfortably at home.  Head is normocephalic, atraumatic.  No labored breathing.  Speech is clear and coherent with logical content.  Patient is alert and oriented at baseline.   Assessment and Plan: Cellulitis  of left lower extremity Assessment & Plan: A prescription for doxycycline  has been sent to the pharmacy to address possible skin infection contributing to redness and swelling. Furosemide  20 mg daily has been refilled to help manage lower extremity swelling. Encourage the patient to continue topical treatments as previously prescribed. Continue hydroxyzine  as needed for itching. Continue topical mupirocin  to any open or irritated areas as instructed. Advise elevating the affected leg several times daily to reduce swelling. Recommend applying warm compresses 2-3 times per day for comfort. Instruct the patient to follow up in the ED if symptoms worsen or fail to improve, including increasing redness, fever, spreading rash, or severe pain.   Orders: -     Furosemide ; Take 1 tablet (20 mg total) by mouth daily.  Dispense: 30 tablet; Refill: 3 -     Doxycycline  Hyclate; Take 1 tablet (100 mg total) by mouth 2 (two) times daily for 7 days.  Dispense: 14 tablet; Refill: 0  Pruritus -     hydrOXYzine  Pamoate; Take 1 capsule (50 mg total) by mouth at bedtime.  Dispense: 30 capsule; Refill: 0  Insomnia, unspecified type -     traZODone  HCl; Take 2 tablets (200 mg total) by mouth at bedtime.  Dispense: 180 tablet; Refill: 0   Note: This chart has been completed using [employer] (consulting) software, and while attempts have been made to ensure accuracy, certain words and phrases may not be transcribed as intended.   Follow Up Instructions: No follow-ups on file.   I discussed the assessment and treatment plan with the patient. The patient was provided an opportunity to ask questions, and all were answered.  The patient agreed with the plan and demonstrated an understanding of the instructions.   The patient was advised to call back or seek an in-person evaluation if the symptoms worsen or if the condition fails to improve as anticipated.  The above assessment and management plan was discussed  with the patient. The patient verbalized understanding of and has agreed to the management plan.   Jaime JENEANE Gerlach, FNP     [1]  Current Outpatient Medications:    doxycycline  (VIBRA -TABS) 100 MG tablet, Take 1 tablet (100 mg total) by mouth 2 (two) times daily for 7 days., Disp: 14 tablet, Rfl: 0   furosemide  (LASIX ) 20 MG tablet, Take 1 tablet (20 mg total) by mouth daily., Disp: 30 tablet, Rfl: 3   hydrOXYzine  (VISTARIL ) 50 MG capsule, Take 1 capsule (50 mg total) by mouth at bedtime., Disp: 30 capsule, Rfl: 0   traZODone  (DESYREL ) 100 MG tablet, Take 2 tablets (200 mg total) by mouth at bedtime., Disp: 180 tablet, Rfl: 0   acetaminophen  (TYLENOL ) 500 MG tablet, Take 1,000 mg by mouth 2 (two) times daily as needed for moderate pain (pain score 4-6) or headache., Disp: , Rfl:    albuterol  (VENTOLIN  HFA) 108 (90 Base) MCG/ACT inhaler, Inhale 2 puffs into the lungs every 6 (six) hours as needed for wheezing or shortness of breath., Disp: 8 g, Rfl: 0   apixaban  (ELIQUIS ) 5 MG TABS tablet, Take 2 tablets (10 mg total) by mouth 2 (two) times daily for 6 days, THEN 1 tablet (5 mg total) 2 (two) times daily., Disp: 60 tablet, Rfl: 4   apixaban  (ELIQUIS ) 5 MG TABS tablet, Take 5 mg by mouth., Disp: , Rfl:    ARIPiprazole  (ABILIFY ) 20 MG tablet, Take 1 tablet (20 mg total) by mouth daily., Disp: 30 tablet, Rfl: 3   betamethasone  dipropionate (DIPROLENE ) 0.05 % ointment, Apply topically 2 (two) times daily., Disp: 30 g, Rfl: 0   buPROPion (WELLBUTRIN XL) 150 MG 24 hr tablet, Take 150 mg by mouth., Disp: , Rfl:    cephALEXin  (KEFLEX ) 500 MG capsule, Take 1 capsule (500 mg total) by mouth 2 (two) times daily., Disp: 14 capsule, Rfl: 0   chlorhexidine  (HIBICLENS ) 4 % external liquid, Apply topically daily as needed., Disp: 236 mL, Rfl: 0   clonazePAM  (KLONOPIN ) 0.5 MG tablet, Take 0.5 mg by mouth 2 (two) times daily as needed for anxiety., Disp: , Rfl:    Continuous Glucose Receiver (FREESTYLE LIBRE 3  READER) DEVI, Use the Freestyle Libre 3 Reader Device to monitor blood sugars as directed by the provider., Disp: 1 each, Rfl: 0   Continuous Glucose Sensor (FREESTYLE LIBRE 3 PLUS SENSOR) MISC, USE TO CHECK GLUCOSE CONTINUOUSLY. CHANGE SENSOR EVERY 15 DAYS, Disp: 6 each, Rfl: 3   DULoxetine  (CYMBALTA ) 60 MG capsule, Take 60 mg by mouth daily., Disp: , Rfl:    famotidine  (PEPCID ) 20 MG tablet, Take 1 tablet (20 mg total) by mouth at bedtime., Disp: , Rfl:    ferrous sulfate  325 (65 FE) MG EC tablet, Take 1 tablet (325 mg total) by mouth daily with breakfast., Disp: 90 tablet, Rfl: 3   hydrOXYzine  (ATARAX ) 25 MG tablet, Take 1 tablet (25 mg total) by mouth every 8 (eight) hours as needed for itching., Disp: 12 tablet, Rfl: 0   hyoscyamine  (ANASPAZ ) 0.125 MG TBDP disintergrating tablet, Place 1 tablet (0.125 mg total) under the tongue every 6 (six) hours as needed., Disp: 180 tablet, Rfl: 1   Insulin  Pen  Needle (PEN NEEDLES) 32G X 6 MM MISC, 1 each by Does not apply route 3 (three) times daily., Disp: 100 each, Rfl: 3   insulin  regular human CONCENTRATED (HUMULIN  R U-500 KWIKPEN) 500 UNIT/ML KwikPen, Inject 80 Units into the skin 3 (three) times daily with meals., Disp: 45 mL, Rfl: 3   lisinopril  (ZESTRIL ) 5 MG tablet, Take 5 mg by mouth daily., Disp: , Rfl:    methadone  (DOLOPHINE ) 10 MG/ML solution, Take 105 mg by mouth daily., Disp: , Rfl:    methocarbamol  (ROBAXIN ) 750 MG tablet, Take 1 tablet (750 mg total) by mouth 3 (three) times daily., Disp: 21 tablet, Rfl: 0   MOUNJARO  7.5 MG/0.5ML Pen, INJECT 1 PEN SUBCUTANEOUSLY ONCE A WEEK, Disp: 12 mL, Rfl: 0   mupirocin  ointment (BACTROBAN ) 2 %, Apply 1 Application topically 2 (two) times daily., Disp: 22 g, Rfl: 0   nicotine  (NICODERM CQ  - DOSED IN MG/24 HOURS) 14 mg/24hr patch, Place 1 patch (14 mg total) onto the skin daily., Disp: 28 patch, Rfl: 0   Omega-3 Fatty Acids (ENTERIC FISH OIL) 1000 MG CPDR, Take 1,000 mg by mouth 3 (three) times daily.,  Disp: , Rfl:    omeprazole  (PRILOSEC) 40 MG capsule, TAKE 1 CAPSULE BY MOUTH TWICE DAILY., Disp: 120 capsule, Rfl: 5   ondansetron  (ZOFRAN -ODT) 4 MG disintegrating tablet, 4mg  ODT q4 hours prn nausea/vomit, Disp: 4 tablet, Rfl: 0   oxyCODONE  (OXY IR/ROXICODONE ) 5 MG immediate release tablet, Take 1 tablet (5 mg total) by mouth every 6 (six) hours as needed for severe pain (pain score 7-10)., Disp: 6 tablet, Rfl: 0   pantoprazole  (PROTONIX ) 20 MG tablet, Take 20 mg by mouth daily., Disp: , Rfl:    phenazopyridine  (PYRIDIUM ) 200 MG tablet, Take 1 tablet (200 mg total) by mouth 3 (three) times daily., Disp: 6 tablet, Rfl: 0   pregabalin  (LYRICA ) 75 MG capsule, Take 75 mg by mouth., Disp: , Rfl:    propranolol  (INDERAL ) 10 MG tablet, Take 1 tablet (10 mg total) by mouth 2 (two) times daily., Disp: , Rfl:    silver  sulfADIAZINE  (SILVADENE ) 1 % cream, APPLY  CREAM EXTERNALLY TWICE DAILY, Disp: 50 g, Rfl: 0   triamcinolone  ointment (KENALOG ) 0.5 %, Apply 1 Application topically 2 (two) times daily., Disp: 30 g, Rfl: 0   venlafaxine  XR (EFFEXOR  XR) 150 MG 24 hr capsule, Take 1 capsule (150 mg total) by mouth daily with breakfast., Disp: 30 capsule, Rfl: 3  Current Facility-Administered Medications:    etonogestrel  (NEXPLANON ) implant 68 mg, 68 mg, Subdermal, Once, Ervin, Michael L, MD

## 2024-10-15 MED ORDER — DOXYCYCLINE HYCLATE 100 MG PO TABS: 100.0000 mg | ORAL_TABLET | Freq: Two times a day (BID) | ORAL | 0 refills | Status: AC

## 2024-10-15 NOTE — Assessment & Plan Note (Signed)
 A prescription for doxycycline  has been sent to the pharmacy to address possible skin infection contributing to redness and swelling. Furosemide  20 mg daily has been refilled to help manage lower extremity swelling. Encourage the patient to continue topical treatments as previously prescribed. Continue hydroxyzine  as needed for itching. Continue topical mupirocin  to any open or irritated areas as instructed. Advise elevating the affected leg several times daily to reduce swelling. Recommend applying warm compresses 2-3 times per day for comfort. Instruct the patient to follow up in the ED if symptoms worsen or fail to improve, including increasing redness, fever, spreading rash, or severe pain.

## 2024-10-16 ENCOUNTER — Ambulatory Visit
Admission: EM | Admit: 2024-10-16 | Discharge: 2024-10-16 | Disposition: A | Discharge status: Home / Self Care | Payer: MEDICAID

## 2024-10-16 ENCOUNTER — Encounter: Payer: Self-pay | Admitting: Emergency Medicine

## 2024-10-16 ENCOUNTER — Ambulatory Visit: Payer: MEDICAID

## 2024-10-16 DIAGNOSIS — I82502 Chronic embolism and thrombosis of unspecified deep veins of left lower extremity: Secondary | ICD-10-CM

## 2024-10-16 DIAGNOSIS — I87332 Chronic venous hypertension (idiopathic) with ulcer and inflammation of left lower extremity: Secondary | ICD-10-CM | POA: Diagnosis not present

## 2024-10-16 NOTE — ED Provider Notes (Signed)
 [facility]    CSN: [id number] Arrival date & time: 10/16/24  1554      History   Chief Complaint Chief Complaint  Patient presents with   Leg Swelling   Leg Pain    HPI Jaime [name] is a 38 y.o. female.   38 year old female who presents urgent care with complaints of left lower extremity itching and swelling.  This has been ongoing for quite some time now.  She did have a deep vein thrombosis in the left leg that required her to have an IVC filter.  Her filter was removed earlier this year.  She has continued to have swelling, pain, numbness and itching in the left leg.  She has been seen several times in the emergency room for this and has had several ultrasounds that show chronic deep vein thrombosis as well as several CTs showing no evidence of pulmonary embolism.  She is on Eliquis .  She is also taking furosemide  to help with the swelling.  She is not currently using any types of compression stockings or elevation.  She denies any fevers or chills.  She does have some sinus congestion that just started 2 days ago.  She denies any nausea, vomiting, chest pain, shortness of breath, cough   Leg Pain Associated symptoms: no back pain and no fever     Past Medical History:  Diagnosis Date   Acute encephalopathy 08/31/2020   Adjustment disorder with depressed mood 09/07/2020   Anxiety    Asthma    Chronic abdominal pain    Chronic back pain    Chronic prescription opiate use 02/16/2022   Cirrhosis (HCC)    Cirrhosis (HCC)    COPD (chronic obstructive pulmonary disease) (HCC)    Depression    Diabetes mellitus without complication (HCC)    Diabetes mellitus, type II (HCC)    Diverticulosis    DVT (deep venous thrombosis) (HCC) 02/04/2023   taking lovenox  prior to procedure   Fatty liver 03/07/2020   GERD (gastroesophageal reflux disease)    Hepatitis C    Hernia, abdominal    Hyperglycemia due to type 2 diabetes mellitus (HCC) 05/24/2020    Hyperlipidemia    IBS (irritable bowel syndrome) 02/16/2022   Insomnia    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)    Lupus    Migraine headache    Morbid obesity (HCC) 05/24/2020   Neuropathy    Nocturnal seizures (HCC)    Pain of upper abdomen 07/28/2021   Peptic ulcer    Spleen enlarged    Splenomegaly     Patient Active Problem List   Diagnosis Date Noted   Cellulitis 07/26/2024   Chronic deep vein thrombosis (DVT) of popliteal vein of left lower extremity (HCC) 07/26/2024   History of hepatitis C 07/26/2024   History of IBS 07/26/2024   Hypomagnesemia 07/26/2024   At risk for sleep apnea 05/17/2024   Left leg pain 05/15/2024   Encounter for examination following treatment at hospital 05/15/2024   Hx of bipolar disorder 04/05/2024   Lumbar pain 12/09/2023   Cellulitis and abscess of buttock 10/22/2023   Type 2 diabetes mellitus with other specified complication (HCC) 10/22/2023   Encounter for IUD insertion 10/06/2023   Hospital discharge follow-up 10/05/2023   Depression, major, single episode, severe (HCC) 10/05/2023   Acute pulmonary embolism (HCC) 09/25/2023   Elevated d-dimer 09/16/2023   Elevated brain natriuretic peptide (BNP) level 09/16/2023   Hypoalbuminemia due to protein-calorie malnutrition 09/16/2023  Acute deep vein thrombosis (DVT) of left lower extremity (HCC) 09/15/2023   Cellulitis of left lower extremity 09/14/2023   Deep vein thrombophlebitis of left leg (HCC) 08/26/2023   Lumbar compression fracture (HCC) 02/10/2023   Left leg DVT (HCC) 02/05/2023   At risk for arrhythmia 01/26/2023   Chronic back pain 01/01/2023   Insomnia 01/01/2023   Depression, recurrent 01/01/2023   Constipation 05/18/2022   IBS (irritable bowel syndrome) 02/16/2022   Chronic prescription opiate use 02/16/2022   Gastroesophageal reflux disease without esophagitis 10/23/2021   Nausea and vomiting 10/23/2021   Diarrhea 10/23/2021   Pain of upper  abdomen 07/28/2021   Adjustment disorder with depressed mood 09/07/2020   Dyspnea 07/29/2020   Anasarca 05/24/2020   Hyperglycemia due to type 2 diabetes mellitus (HCC) 05/24/2020   Morbid obesity (HCC) 05/24/2020   Elevated serum immunoglobulin free light chains 03/08/2020   Fatty liver 03/07/2020   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   Splenomegaly 03/07/2020   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   Pressure injury of skin 01/29/2019   Uncontrolled diabetes mellitus 09/19/2018   Hyperglycemia 09/17/2018   Acute respiratory failure with hypoxia (HCC) 09/15/2018   Anxiety 09/15/2018   Chronic abdominal pain 09/15/2018   Diabetes mellitus without complication (HCC) 09/15/2018   Hepatitis C 09/15/2018   Peptic ulcer 09/15/2018   Cirrhosis (HCC) 09/15/2018   Tobacco use disorder 05/22/2018   Opioid use disorder 05/02/2018   Type 2 diabetes mellitus with diabetic neuropathy, with long-term current use of insulin  (HCC) 05/02/2018   Mood disorder 05/02/2018   CAP (community acquired pneumonia) 07/17/2016   Chronic pain disorder 07/17/2016   Depression 05/24/2013    Past Surgical History:  Procedure Laterality Date   BIOPSY  11/25/2021   Procedure: BIOPSY;  Surgeon: Eartha Angelia Sieving, MD;  Location: [facility];  Service: Gastroenterology;;   CHOLECYSTECTOMY     COLONOSCOPY WITH PROPOFOL  N/A 11/25/2021   Procedure: COLONOSCOPY WITH PROPOFOL ;  Surgeon: Eartha Angelia Sieving, MD;  Location: [facility];  Service: Gastroenterology;  Laterality: N/A;  805   COLONOSCOPY WITH PROPOFOL  N/A 03/31/2022   Procedure: COLONOSCOPY WITH PROPOFOL ;  Surgeon: Eartha Angelia Sieving, MD;  Location: [facility];  Service: Gastroenterology;  Laterality: N/A;  730   ESOPHAGOGASTRODUODENOSCOPY  06/2020   done at [facility], candida in upper esophagus (treated with diflucan ), ulcerative esophagitis at GE junction, gastritis in stomach, single ulcer in duodenal bulb, with duodenal  mucosa showing no abnormality. No presence of varices   ESOPHAGOGASTRODUODENOSCOPY (EGD) WITH PROPOFOL  N/A 11/25/2021   Procedure: ESOPHAGOGASTRODUODENOSCOPY (EGD) WITH PROPOFOL ;  Surgeon: Eartha Angelia Sieving, MD;  Location: [facility];  Service: Gastroenterology;  Laterality: N/A;   ESOPHAGOGASTRODUODENOSCOPY (EGD) WITH PROPOFOL  N/A 03/31/2022   Procedure: ESOPHAGOGASTRODUODENOSCOPY (EGD) WITH PROPOFOL ;  Surgeon: Eartha Angelia Sieving, MD;  Location: [facility];  Service: Gastroenterology;  Laterality: N/A;   IVC FILTER REMOVAL N/A 09/16/2023   Procedure: IVC FILTER REMOVAL;  Surgeon: Pearline Norman RAMAN, MD;  Location: [facility] CV [facility];  Service: Cardiovascular;  Laterality: N/A;   IVC FILTER REMOVAL N/A 02/14/2024   Procedure: IVC FILTER REMOVAL;  Surgeon: Pearline Norman RAMAN, MD;  Location: [facility] CV LAB;  Service: Cardiovascular;  Laterality: N/A;   PERIPHERAL VASCULAR THROMBECTOMY Left 09/16/2023   Procedure: PERIPHERAL VASCULAR THROMBECTOMY;  Surgeon: Pearline Norman RAMAN, MD;  Location: [facility] INVASIVE CV LAB;  Service: Cardiovascular;  Laterality: Left;    OB History     Gravida  0   Para  0  Term  0   Preterm  0   AB  0   Living  0      SAB  0   IAB  0   Ectopic  0   Multiple  0   Live Births  0            Home Medications    Prior to Admission medications  Medication Sig Start Date End Date Taking? Authorizing Provider  acetaminophen  (TYLENOL ) 500 MG tablet Take 1,000 mg by mouth 2 (two) times daily as needed for moderate pain (pain score 4-6) or headache.   Yes [provider]  apixaban  (ELIQUIS ) 5 MG TABS tablet Take 5 mg by mouth. 07/28/24  Yes [provider]  ARIPiprazole  (ABILIFY ) 20 MG tablet Take 1 tablet (20 mg total) by mouth daily. 05/31/24  Yes Bevely Doffing, FNP  betamethasone  dipropionate (DIPROLENE ) 0.05 % ointment Apply topically 2 (two) times daily. 07/05/24  Yes Barrett, Jamie N, PA-C  buPROPion (WELLBUTRIN XL)  150 MG 24 hr tablet Take 150 mg by mouth. 06/29/24  Yes [provider]  cephALEXin  (KEFLEX ) 500 MG capsule Take 1 capsule (500 mg total) by mouth 2 (two) times daily. 09/25/24  Yes Lenor Hollering, MD  clonazePAM  (KLONOPIN ) 0.5 MG tablet Take 0.5 mg by mouth 2 (two) times daily as needed for anxiety. 08/02/23  Yes [provider]  Continuous Glucose Receiver (FREESTYLE LIBRE 3 READER) DEVI Use the Freestyle Libre 3 Reader Device to monitor blood sugars as directed by the provider. 02/29/24  Yes Reardon, Benton PARAS, NP  doxycycline  (VIBRA -TABS) 100 MG tablet Take 1 tablet (100 mg total) by mouth 2 (two) times daily for 7 days. 10/15/24 10/22/24 Yes Bacchus, Meade PEDLAR, FNP  DULoxetine  (CYMBALTA ) 60 MG capsule Take 60 mg by mouth daily. 07/22/24  Yes [provider]  famotidine  (PEPCID ) 20 MG tablet Take 1 tablet (20 mg total) by mouth at bedtime. 09/28/23  Yes Ricky Fines, MD  ferrous sulfate  325 (65 FE) MG EC tablet Take 1 tablet (325 mg total) by mouth daily with breakfast. 09/13/23  Yes Pennington, Rebekah M, PA-C  furosemide  (LASIX ) 20 MG tablet Take 1 tablet (20 mg total) by mouth daily. 10/12/24  Yes Bacchus, Meade PEDLAR, FNP  hydrOXYzine  (ATARAX ) 25 MG tablet Take 1 tablet (25 mg total) by mouth every 8 (eight) hours as needed for itching. 10/10/24  Yes Geiple, Joshua, PA-C  hydrOXYzine  (VISTARIL ) 50 MG capsule Take 1 capsule (50 mg total) by mouth at bedtime. 10/12/24  Yes Bacchus, Meade PEDLAR, FNP  Insulin  Pen Needle (PEN NEEDLES) 32G X 6 MM MISC 1 each by Does not apply route 3 (three) times daily. 02/26/21  Yes Reardon, Benton PARAS, NP  insulin  regular human CONCENTRATED (HUMULIN  R U-500 KWIKPEN) 500 UNIT/ML KwikPen Inject 80 Units into the skin 3 (three) times daily with meals. 03/06/24  Yes Reardon, Benton PARAS, NP  lisinopril  (ZESTRIL ) 5 MG tablet Take 5 mg by mouth daily.   Yes [provider]  methadone  (DOLOPHINE ) 10 MG/ML solution Take 105 mg by mouth daily.   Yes  [provider]  methocarbamol  (ROBAXIN ) 750 MG tablet Take 1 tablet (750 mg total) by mouth 3 (three) times daily. 07/01/24  Yes Daralene Bruckner D, PA-C  MOUNJARO  7.5 MG/0.5ML Pen INJECT 1 PEN SUBCUTANEOUSLY ONCE A WEEK 08/17/24  Yes Therisa Benton PARAS, NP  mupirocin  ointment (BACTROBAN ) 2 % Apply 1 Application topically 2 (two) times daily. 09/25/24  Yes Lenor Hollering, MD  nicotine   polacrilex (NICORETTE ) 4 MG gum SMARTSIG:1 Each By Mouth Every 4 Hours PRN 09/13/24  Yes [provider]  Omega-3 Fatty Acids (ENTERIC FISH OIL) 1000 MG CPDR Take 1,000 mg by mouth 3 (three) times daily.   Yes [provider]  omeprazole  (PRILOSEC) 40 MG capsule TAKE 1 CAPSULE BY MOUTH TWICE DAILY. 05/10/23  Yes Carlan, Chelsea L, NP  oxyCODONE  (OXY IR/ROXICODONE ) 5 MG immediate release tablet Take 1 tablet (5 mg total) by mouth every 6 (six) hours as needed for severe pain (pain score 7-10). 10/10/24  Yes Geiple, Joshua, PA-C  pantoprazole  (PROTONIX ) 20 MG tablet Take 20 mg by mouth daily. 12/30/23  Yes [provider]  pregabalin  (LYRICA ) 75 MG capsule Take 75 mg by mouth. 07/28/24  Yes [provider]  propranolol  (INDERAL ) 10 MG tablet Take 1 tablet (10 mg total) by mouth 2 (two) times daily. 09/28/23  Yes Ricky Fines, MD  QUEtiapine  (SEROQUEL ) 25 MG tablet Take 25 mg by mouth at bedtime. 09/21/24  Yes [provider]  silver  sulfADIAZINE  (SILVADENE ) 1 % cream APPLY  CREAM EXTERNALLY TWICE DAILY 08/24/24  Yes Bacchus, Gloria Z, FNP  traZODone  (DESYREL ) 100 MG tablet Take 2 tablets (200 mg total) by mouth at bedtime. 10/12/24  Yes Bacchus, Meade PEDLAR, FNP  triamcinolone  ointment (KENALOG ) 0.5 % Apply 1 Application topically 2 (two) times daily. 09/25/24  Yes Lenor Hollering, MD  venlafaxine  XR (EFFEXOR  XR) 150 MG 24 hr capsule Take 1 capsule (150 mg total) by mouth daily with breakfast. 05/31/24  Yes Bevely Doffing, FNP  albuterol  (VENTOLIN  HFA) 108 (90 Base) MCG/ACT  inhaler Inhale 2 puffs into the lungs every 6 (six) hours as needed for wheezing or shortness of breath. 07/17/22   Leath-Warren, Etta PARAS, NP  apixaban  (ELIQUIS ) 5 MG TABS tablet Take 2 tablets (10 mg total) by mouth 2 (two) times daily for 6 days, THEN 1 tablet (5 mg total) 2 (two) times daily. 09/28/23 08/17/24  Ricky Fines, MD  chlorhexidine  (HIBICLENS ) 4 % external liquid Apply topically daily as needed. Patient not taking: Reported on 10/16/2024 05/13/24   Stuart Vernell Norris, PA-C  Continuous Glucose Sensor (FREESTYLE LIBRE 3 PLUS SENSOR) MISC USE TO CHECK GLUCOSE CONTINUOUSLY. CHANGE SENSOR EVERY 15 DAYS 10/06/24   Therisa Benton PARAS, NP  hyoscyamine  (ANASPAZ ) 0.125 MG TBDP disintergrating tablet Place 1 tablet (0.125 mg total) under the tongue every 6 (six) hours as needed. Patient not taking: Reported on 10/16/2024 12/13/23   Castaneda Mayorga, Toribio, MD  nicotine  (NICODERM CQ  - DOSED IN MG/24 HOURS) 14 mg/24hr patch Place 1 patch (14 mg total) onto the skin daily. 07/07/24   Therisa Benton PARAS, NP  ondansetron  (ZOFRAN -ODT) 4 MG disintegrating tablet 4mg  ODT q4 hours prn nausea/vomit 09/25/24   Lenor Hollering, MD  phenazopyridine  (PYRIDIUM ) 200 MG tablet Take 1 tablet (200 mg total) by mouth 3 (three) times daily. Patient not taking: Reported on 10/16/2024 08/18/24   Barrett, Warren SAILOR, PA-C    Family History Family History  Problem Relation Age of Onset   Hyperlipidemia Maternal Grandfather     Social History Social History[1]   Allergies   Firvanq  [vancomycin ], Iodine -131, Ivp dye [iodinated contrast media], Toradol [ketorolac tromethamine], Tylenol  [acetaminophen ], Nsaids, Suboxone [buprenorphine hcl-naloxone  hcl], Vancomycin  hcl in nacl, and Neurontin  [gabapentin ]   Review of Systems Review of Systems  Constitutional:  Negative for chills and fever.  HENT:  Negative for ear pain and sore throat.   Eyes:  Negative for pain and visual disturbance.  Respiratory:  Negative  for cough and shortness of breath.   Cardiovascular:  Positive for leg swelling. Negative for chest pain and palpitations.  Gastrointestinal:  Negative for abdominal pain and vomiting.  Genitourinary:  Negative for dysuria and hematuria.  Musculoskeletal:  Negative for arthralgias and back pain.  Skin:  Negative for color change and rash.       Left lower extremity itching and color change  Neurological:  Negative for seizures and syncope.  All other systems reviewed and are negative.    Physical Exam Triage Vital Signs ED Triage Vitals  Encounter Vitals Group     BP 10/16/24 1651 112/74     Girls Systolic BP Percentile --      Girls Diastolic BP Percentile --      Boys Systolic BP Percentile --      Boys Diastolic BP Percentile --      Pulse Rate 10/16/24 1651 98     Resp 10/16/24 1651 18     Temp 10/16/24 1651 98.5 F (36.9 C)     Temp Source 10/16/24 1651 Oral     SpO2 10/16/24 1651 96 %     Weight --      Height --      Head Circumference --      Peak Flow --      Pain Score 10/16/24 1644 9     Pain Loc --      Pain Education --      Exclude from Growth Chart --    No data found.  Updated Vital Signs BP 112/74 (BP Location: Left Arm)   Pulse 98   Temp 98.5 F (36.9 C) (Oral)   Resp 18   LMP 09/18/2024 (Approximate)   SpO2 96%   Visual Acuity Right Eye Distance:   Left Eye Distance:   Bilateral Distance:    Right Eye Near:   Left Eye Near:    Bilateral Near:     Physical Exam Vitals and nursing note reviewed.  Constitutional:      General: She is not in acute distress.    Appearance: She is well-developed.  HENT:     Head: Normocephalic and atraumatic.  Eyes:     Conjunctiva/sclera: Conjunctivae normal.  Cardiovascular:     Rate and Rhythm: Normal rate and regular rhythm.     Heart sounds: No murmur heard. Pulmonary:     Effort: Pulmonary effort is normal. No respiratory distress.     Breath sounds: Normal breath sounds.  Abdominal:      Palpations: Abdomen is soft.     Tenderness: There is no abdominal tenderness.  Musculoskeletal:        General: No swelling.     Cervical back: Neck supple.  Skin:    General: Skin is warm and dry.     Capillary Refill: Capillary refill takes less than 2 seconds.      Neurological:     Mental Status: She is alert.  Psychiatric:        Mood and Affect: Mood normal.      UC Treatments / Results  Labs (all labs ordered are listed, but only abnormal results are displayed) Labs Reviewed - No data to display  EKG   Radiology No results found.  Procedures Procedures (including critical care time)  Medications Ordered in UC Medications - No data to display  Initial Impression / Assessment and Plan / UC Course  I have reviewed the triage vital signs and the nursing  notes.  Pertinent labs & imaging results that were available during my care of the patient were reviewed by me and considered in my medical decision making (see chart for details).     Chronic deep vein thrombosis (DVT) of left lower extremity, unspecified vein (HCC)  Stasis dermatitis of left lower extremity with venous ulcer due to chronic peripheral venous hypertension (HCC)  Symptoms, past history and physical exam findings are most consistent with stasis dermatitis likely secondary to chronic venous insufficiency from chronic deep vein thrombosis.  This is a condition that normally requires long-term compression and elevation to help control the swelling.  This condition can cause itching, numbness, pain and recurrent cellulitis.  We recommend using compression stockings.  Can use up to 20 mmHg but may want to start with 5 to 10 mmHg due to the level of compression making it difficult to put the stockings on.  Recommend elevating the legs anytime that you are sitting.  Also recommend contacting vascular surgery to schedule a follow-up appointment due to chronic lower extremity swelling after deep vein thrombosis.   For the itching recommend using triamcinolone  ointment twice daily routinely.  Can return to urgent care if needed.   Final Clinical Impressions(s) / UC Diagnoses   Final diagnoses:  Chronic deep vein thrombosis (DVT) of left lower extremity, unspecified vein (HCC)  Stasis dermatitis of left lower extremity with venous ulcer due to chronic peripheral venous hypertension (HCC)     Discharge Instructions      Symptoms, past history and physical exam findings are most consistent with stasis dermatitis likely secondary to chronic venous insufficiency from chronic deep vein thrombosis.  This is a condition that normally requires long-term compression and elevation to help control the swelling.  This condition can cause itching, numbness, pain and recurrent cellulitis.  We recommend using compression stockings.  Can use up to 20 mmHg but may want to start with 5 to 10 mmHg due to the level of compression making it difficult to put the stockings on.  Recommend elevating the legs anytime that you are sitting.  Also recommend contacting vascular surgery to schedule a follow-up appointment due to chronic lower extremity swelling after deep vein thrombosis.  For the itching recommend using triamcinolone  ointment twice daily routinely.  Can return to urgent care if needed.     ED Prescriptions   None    PDMP not reviewed this encounter.    [1]  Social History Tobacco Use   Smoking status: Former    Current packs/day: 0.50    Types: Cigarettes    Passive exposure: Current   Smokeless tobacco: Never  Vaping Use   Vaping status: Some Days   Substances: Nicotine , Flavoring  Substance Use Topics   Alcohol use: No   Drug use: Not Currently     Teresa Almarie LABOR, PA-C 10/16/24 1733

## 2024-10-16 NOTE — ED Triage Notes (Addendum)
 Pt reports L lower leg swelling and pain x 1 month. Pt had a DVT study on 11/25 that found a chronic DVT. Pt is on eliquis . Pt notes the pain, swelling, and itching of the L leg is chronic, but has continued to get worse over the last month. Also notes the area of discoloration around the leg has continued to spread. Pt states she had an ultrasound and a CT scan a few weeks ago that showed the no new DVTs and the original DVT was still in the same location. Pt has taken antibiotic, hydroxyzine ,  and used mupirocin  ointment with no improvement. Notes numbness and tingling in L leg.   Currently taking doxycycline  and keflex .

## 2024-10-16 NOTE — Discharge Instructions (Addendum)
 Symptoms, past history and physical exam findings are most consistent with stasis dermatitis likely secondary to chronic venous insufficiency from chronic deep vein thrombosis.  This is a condition that normally requires long-term compression and elevation to help control the swelling.  This condition can cause itching, numbness, pain and recurrent cellulitis.  We recommend using compression stockings.  Can use up to 20 mmHg but may want to start with 5 to 10 mmHg due to the level of compression making it difficult to put the stockings on.  Recommend elevating the legs anytime that you are sitting.  Also recommend contacting vascular surgery to schedule a follow-up appointment due to chronic lower extremity swelling after deep vein thrombosis.  For the itching recommend using triamcinolone  ointment twice daily routinely.  Can return to urgent care if needed.

## 2024-10-19 ENCOUNTER — Ambulatory Visit: Payer: MEDICAID

## 2024-10-19 VITALS — BP 132/83 | HR 90 | Ht 70.0 in | Wt 250.0 lb

## 2024-10-19 DIAGNOSIS — R112 Nausea with vomiting, unspecified: Secondary | ICD-10-CM | POA: Diagnosis not present

## 2024-10-19 DIAGNOSIS — I872 Venous insufficiency (chronic) (peripheral): Secondary | ICD-10-CM

## 2024-10-19 DIAGNOSIS — L299 Pruritus, unspecified: Secondary | ICD-10-CM

## 2024-10-19 MED ORDER — ONDANSETRON 4 MG PO TBDP: 4.0000 mg | ORAL_TABLET | Freq: Three times a day (TID) | ORAL | 2 refills | Status: AC | PRN

## 2024-10-19 MED ORDER — HYDROXYZINE PAMOATE 50 MG PO CAPS: 50.0000 mg | ORAL_CAPSULE | Freq: Three times a day (TID) | ORAL | 3 refills | Status: AC | PRN

## 2024-10-19 NOTE — Progress Notes (Unsigned)
 Established Patient Office Visit  Subjective   Patient ID: Jaime [name], female    DOB: 07-13-1986  Age: 38 y.o. MRN: [id number]  Chief Complaint  Patient presents with   Medical Management of Chronic Issues    Pt has a blood clot in her left leg, its swollen and painful and itcy and red and warm to the touch    HPI  Patient Active Problem List   Diagnosis Date Noted   Cellulitis 07/26/2024   Chronic deep vein thrombosis (DVT) of popliteal vein of left lower extremity (HCC) 07/26/2024   History of hepatitis C 07/26/2024   History of IBS 07/26/2024   Hypomagnesemia 07/26/2024   At risk for sleep apnea 05/17/2024   Left leg pain 05/15/2024   Encounter for examination following treatment at hospital 05/15/2024   Hx of bipolar disorder 04/05/2024   Lumbar pain 12/09/2023   Cellulitis and abscess of buttock 10/22/2023   Type 2 diabetes mellitus with other specified complication (HCC) 10/22/2023   Encounter for IUD insertion 10/06/2023   Hospital discharge follow-up 10/05/2023   Depression, major, single episode, severe (HCC) 10/05/2023   Acute pulmonary embolism (HCC) 09/25/2023   Elevated d-dimer 09/16/2023   Elevated brain natriuretic peptide (BNP) level 09/16/2023   Hypoalbuminemia due to protein-calorie malnutrition 09/16/2023   Acute deep vein thrombosis (DVT) of left lower extremity (HCC) 09/15/2023   Cellulitis of left lower extremity 09/14/2023   Deep vein thrombophlebitis of left leg (HCC) 08/26/2023   Lumbar compression fracture (HCC) 02/10/2023   Left leg DVT (HCC) 02/05/2023   At risk for arrhythmia 01/26/2023   Chronic back pain 01/01/2023   Insomnia 01/01/2023   Depression, recurrent 01/01/2023   Constipation 05/18/2022   IBS (irritable bowel syndrome) 02/16/2022   Chronic prescription opiate use 02/16/2022   Gastroesophageal reflux disease without esophagitis 10/23/2021   Nausea and vomiting 10/23/2021   Diarrhea 10/23/2021   Pain of upper abdomen  07/28/2021   Adjustment disorder with depressed mood 09/07/2020   Dyspnea 07/29/2020   Anasarca 05/24/2020   Hyperglycemia due to type 2 diabetes mellitus (HCC) 05/24/2020   Morbid obesity (HCC) 05/24/2020   Elevated serum immunoglobulin free light chains 03/08/2020   Fatty liver 03/07/2020   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   Splenomegaly 03/07/2020   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   Pressure injury of skin 01/29/2019   Uncontrolled diabetes mellitus 09/19/2018   Hyperglycemia 09/17/2018   Acute respiratory failure with hypoxia (HCC) 09/15/2018   Anxiety 09/15/2018   Chronic abdominal pain 09/15/2018   Diabetes mellitus without complication (HCC) 09/15/2018   Hepatitis C 09/15/2018   Peptic ulcer 09/15/2018   Cirrhosis (HCC) 09/15/2018   Tobacco use disorder 05/22/2018   Opioid use disorder 05/02/2018   Type 2 diabetes mellitus with diabetic neuropathy, with long-term current use of insulin  (HCC) 05/02/2018   Mood disorder 05/02/2018   CAP (community acquired pneumonia) 07/17/2016   Chronic pain disorder 07/17/2016   Depression 05/24/2013    ROS    Objective:     BP 132/83   Pulse 90   Ht 5' 10 (1.778 m)   Wt 250 lb (113.4 kg)   LMP 09/18/2024 (Approximate)   SpO2 94%   BMI 35.87 kg/m  BP Readings from Last 3 Encounters:  10/19/24 132/83  10/16/24 112/74  10/10/24 118/73   Wt Readings from Last 3 Encounters:  10/19/24 250 lb (113.4 kg)  10/10/24 230 lb (104.3 kg)  09/25/24 213 lb (96.6 kg)     Physical  Exam   No results found for any visits on 10/19/24.  {Labs (Optional):23779}  The 10-year ASCVD risk score (Arnett DK, et al., 2019) is: 1.4%    Assessment & Plan:   Problem List Items Addressed This Visit   None   No follow-ups on file.    Leita Longs, FNP

## 2024-10-20 ENCOUNTER — Telehealth: Payer: Self-pay

## 2024-10-20 NOTE — Telephone Encounter (Signed)
 Copied from CRM #[id number]. Topic: Clinical - Medication Question >> Oct 20, 2024  9:16 AM Myrick T wrote: Reason for CRM: Ronal from [facility] [phone] called to get clarification on the directions for ondansetron  (ZOFRAN -ODT) 4 MG disintegrating tablet

## 2024-10-20 NOTE — Telephone Encounter (Signed)
 Pharmacy Advised of the right directions for pt

## 2024-10-22 DIAGNOSIS — L299 Pruritus, unspecified: Secondary | ICD-10-CM | POA: Insufficient documentation

## 2024-10-22 DIAGNOSIS — I872 Venous insufficiency (chronic) (peripheral): Secondary | ICD-10-CM | POA: Insufficient documentation

## 2024-10-22 NOTE — Assessment & Plan Note (Signed)
 Acute nausea and vomiting today. No constipation. Unlikely related to Mounjaro  injections. - Prescribed Zofran  for nausea. - Advised small sips of electrolyte solutions. - Recommended bland diet until symptoms improve.

## 2024-10-22 NOTE — Assessment & Plan Note (Signed)
 Chronic venous insufficiency with pruritus, redness, and swelling. Symptoms worsen with prolonged standing. Current compression socks inadequate. - Advised wearing knee-high compression socks with proper fit. - Recommended applying thick lotion or Vaseline to alleviate itching. - Provided work note for absence until after Christmas. - Suggested visiting [facility] for compression sock fitting.

## 2024-10-30 ENCOUNTER — Encounter (HOSPITAL_COMMUNITY): Payer: Self-pay

## 2024-10-30 ENCOUNTER — Ambulatory Visit (HOSPITAL_COMMUNITY): Payer: MEDICAID | Admitting: Registered Nurse

## 2024-10-31 ENCOUNTER — Ambulatory Visit: Payer: MEDICAID

## 2024-11-06 ENCOUNTER — Ambulatory Visit: Payer: MEDICAID

## 2024-11-16 ENCOUNTER — Emergency Department (HOSPITAL_COMMUNITY)
Admission: EM | Admit: 2024-11-16 | Discharge: 2024-11-16 | Disposition: A | Discharge status: Home / Self Care | Attending: Emergency Medicine | Admitting: Emergency Medicine

## 2024-11-16 ENCOUNTER — Other Ambulatory Visit: Payer: Self-pay

## 2024-11-16 ENCOUNTER — Encounter (HOSPITAL_COMMUNITY): Payer: Self-pay

## 2024-11-16 DIAGNOSIS — M791 Myalgia, unspecified site: Secondary | ICD-10-CM | POA: Diagnosis present

## 2024-11-16 MED ORDER — CYCLOBENZAPRINE HCL 10 MG PO TABS: 10.0000 mg | ORAL_TABLET | Freq: Three times a day (TID) | ORAL | 0 refills | Status: AC

## 2024-11-16 NOTE — Discharge Instructions (Signed)
 You were given a short prescription for muscle relaxers, please take 1 tablet up to 3 times a day to help with muscle spasms and pain.  You will need to continue with ice versus heat to help with your symptoms.  Follow-up with your primary care physician as needed.
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
## 2024-11-17 ENCOUNTER — Encounter (HOSPITAL_COMMUNITY): Payer: Self-pay

## 2024-11-17 ENCOUNTER — Emergency Department (HOSPITAL_COMMUNITY)

## 2024-11-17 ENCOUNTER — Emergency Department (HOSPITAL_COMMUNITY)
Admission: EM | Admit: 2024-11-17 | Discharge: 2024-11-17 | Disposition: A | Discharge status: Home / Self Care | Attending: Emergency Medicine | Admitting: Emergency Medicine

## 2024-11-17 DIAGNOSIS — I749 Embolism and thrombosis of unspecified artery: Secondary | ICD-10-CM | POA: Insufficient documentation

## 2024-11-17 DIAGNOSIS — S39012A Strain of muscle, fascia and tendon of lower back, initial encounter: Secondary | ICD-10-CM | POA: Insufficient documentation

## 2024-11-17 DIAGNOSIS — S3992XA Unspecified injury of lower back, initial encounter: Secondary | ICD-10-CM | POA: Diagnosis present

## 2024-11-17 DIAGNOSIS — Z79899 Other long term (current) drug therapy: Secondary | ICD-10-CM | POA: Insufficient documentation

## 2024-11-17 DIAGNOSIS — E119 Type 2 diabetes mellitus without complications: Secondary | ICD-10-CM | POA: Diagnosis not present

## 2024-11-17 DIAGNOSIS — Z7901 Long term (current) use of anticoagulants: Secondary | ICD-10-CM | POA: Diagnosis not present

## 2024-11-17 DIAGNOSIS — Y92410 Unspecified street and highway as the place of occurrence of the external cause: Secondary | ICD-10-CM | POA: Insufficient documentation

## 2024-11-17 DIAGNOSIS — Z794 Long term (current) use of insulin: Secondary | ICD-10-CM | POA: Diagnosis not present

## 2024-11-17 DIAGNOSIS — K7460 Unspecified cirrhosis of liver: Secondary | ICD-10-CM | POA: Insufficient documentation

## 2024-11-17 LAB — LIPASE, BLOOD: Lipase: 13 U/L (ref 11–51)

## 2024-11-17 LAB — COMPREHENSIVE METABOLIC PANEL WITH GFR
ALT: 27 U/L (ref 0–44)
AST: 30 U/L (ref 15–41)
Albumin: 3.8 g/dL (ref 3.5–5.0)
Alkaline Phosphatase: 180 U/L — ABNORMAL HIGH (ref 38–126)
Anion gap: 11 (ref 5–15)
BUN: 8 mg/dL (ref 6–20)
CO2: 23 mmol/L (ref 22–32)
Calcium: 9.3 mg/dL (ref 8.9–10.3)
Chloride: 102 mmol/L (ref 98–111)
Creatinine, Ser: 0.64 mg/dL (ref 0.44–1.00)
GFR, Estimated: >60 mL/min
Glucose, Bld: 156 mg/dL — ABNORMAL HIGH (ref 70–99)
Potassium: 3.7 mmol/L (ref 3.5–5.1)
Sodium: 137 mmol/L (ref 135–145)
Total Bilirubin: 1.1 mg/dL (ref 0.0–1.2)
Total Protein: 7.5 g/dL (ref 6.5–8.1)

## 2024-11-17 LAB — URINALYSIS, ROUTINE W REFLEX MICROSCOPIC
Glucose, UA: NEGATIVE mg/dL
Hgb urine dipstick: NEGATIVE
Ketones, ur: NEGATIVE mg/dL
Leukocytes,Ua: NEGATIVE
Nitrite: NEGATIVE
Protein, ur: 100 mg/dL — AB
Specific Gravity, Urine: 1.039 — ABNORMAL HIGH (ref 1.005–1.030)
pH: 5 (ref 5.0–8.0)

## 2024-11-17 LAB — CBC WITH DIFFERENTIAL/PLATELET
Abs Immature Granulocytes: 0.03 K/uL (ref 0.00–0.07)
Basophils Absolute: 0 K/uL (ref 0.0–0.1)
Basophils Relative: 0 %
Eosinophils Absolute: 0.5 K/uL (ref 0.0–0.5)
Eosinophils Relative: 5 %
HCT: 44 % (ref 36.0–46.0)
Hemoglobin: 15.3 g/dL — ABNORMAL HIGH (ref 12.0–15.0)
Immature Granulocytes: 0 %
Lymphocytes Relative: 10 %
Lymphs Abs: 0.9 K/uL (ref 0.7–4.0)
MCH: 30.5 pg (ref 26.0–34.0)
MCHC: 34.8 g/dL (ref 30.0–36.0)
MCV: 87.8 fL (ref 80.0–100.0)
Monocytes Absolute: 0.9 K/uL (ref 0.1–1.0)
Monocytes Relative: 9 %
Neutro Abs: 7.1 K/uL (ref 1.7–7.7)
Neutrophils Relative %: 76 %
Platelets: 104 K/uL — ABNORMAL LOW (ref 150–400)
RBC: 5.01 MIL/uL (ref 3.87–5.11)
RDW: 13.2 % (ref 11.5–15.5)
WBC: 9.4 K/uL (ref 4.0–10.5)
nRBC: 0 % (ref 0.0–0.2)

## 2024-11-17 LAB — HCG, SERUM, QUALITATIVE: Preg, Serum: NEGATIVE

## 2024-11-17 MED ORDER — CYCLOBENZAPRINE HCL 10 MG PO TABS
10.0000 mg | ORAL_TABLET | Freq: Once | ORAL | Status: AC
  Administered 2024-11-17: 10 mg via ORAL
  Filled 2024-11-17: qty 1

## 2024-11-17 MED ORDER — MORPHINE SULFATE (PF) 4 MG/ML IV SOLN
4.0000 mg | Freq: Once | INTRAVENOUS | Status: AC
  Administered 2024-11-17: 4 mg via INTRAVENOUS
  Filled 2024-11-17: qty 1

## 2024-11-17 MED ORDER — LIDOCAINE 5 % EX PTCH
1.0000 | MEDICATED_PATCH | Freq: Once | CUTANEOUS | Status: DC
  Administered 2024-11-17: 1 via TRANSDERMAL
  Filled 2024-11-17: qty 2
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
## 2024-12-08 ENCOUNTER — Other Ambulatory Visit (HOSPITAL_COMMUNITY): Payer: MEDICAID

## 2024-12-28 ENCOUNTER — Ambulatory Visit: Payer: MEDICAID | Admitting: Nurse Practitioner

## 2025-01-11 ENCOUNTER — Other Ambulatory Visit (HOSPITAL_COMMUNITY): Payer: MEDICAID

## 2025-01-11 ENCOUNTER — Ambulatory Visit: Payer: MEDICAID

## 2025-01-12 ENCOUNTER — Ambulatory Visit: Payer: MEDICAID

## 2025-03-02 ENCOUNTER — Ambulatory Visit (HOSPITAL_COMMUNITY): Payer: MEDICAID

## 2025-03-02 ENCOUNTER — Encounter: Payer: MEDICAID | Admitting: Vascular Surgery
